# Patient Record
Sex: Female | Born: 1935 | Race: White | Hispanic: No | Smoking: Never smoker
Health system: Southern US, Community
[De-identification: ages and names within clinical notes are randomized; demographics above are authoritative.]

## PROBLEM LIST (undated history)

## (undated) DIAGNOSIS — M8580 Other specified disorders of bone density and structure, unspecified site: Secondary | ICD-10-CM

## (undated) DIAGNOSIS — C50919 Malignant neoplasm of unspecified site of unspecified female breast: Principal | ICD-10-CM

## (undated) DIAGNOSIS — K469 Unspecified abdominal hernia without obstruction or gangrene: Secondary | ICD-10-CM

## (undated) DIAGNOSIS — I77810 Thoracic aortic ectasia: Secondary | ICD-10-CM

## (undated) DIAGNOSIS — I4821 Permanent atrial fibrillation: Secondary | ICD-10-CM

## (undated) DIAGNOSIS — I639 Cerebral infarction, unspecified: Secondary | ICD-10-CM

## (undated) DIAGNOSIS — K5790 Diverticulosis of intestine, part unspecified, without perforation or abscess without bleeding: Secondary | ICD-10-CM

## (undated) DIAGNOSIS — C189 Malignant neoplasm of colon, unspecified: Secondary | ICD-10-CM

## (undated) DIAGNOSIS — I2510 Atherosclerotic heart disease of native coronary artery without angina pectoris: Secondary | ICD-10-CM

## (undated) DIAGNOSIS — I350 Nonrheumatic aortic (valve) stenosis: Secondary | ICD-10-CM

## (undated) DIAGNOSIS — N321 Vesicointestinal fistula: Secondary | ICD-10-CM

## (undated) DIAGNOSIS — I5032 Chronic diastolic (congestive) heart failure: Secondary | ICD-10-CM

## (undated) DIAGNOSIS — R001 Bradycardia, unspecified: Secondary | ICD-10-CM

## (undated) DIAGNOSIS — I5181 Takotsubo syndrome: Secondary | ICD-10-CM

## (undated) DIAGNOSIS — C801 Malignant (primary) neoplasm, unspecified: Secondary | ICD-10-CM

## (undated) DIAGNOSIS — J449 Chronic obstructive pulmonary disease, unspecified: Secondary | ICD-10-CM

## (undated) DIAGNOSIS — J189 Pneumonia, unspecified organism: Secondary | ICD-10-CM

## (undated) DIAGNOSIS — K449 Diaphragmatic hernia without obstruction or gangrene: Secondary | ICD-10-CM

## (undated) DIAGNOSIS — R600 Localized edema: Secondary | ICD-10-CM

## (undated) DIAGNOSIS — T7840XA Allergy, unspecified, initial encounter: Secondary | ICD-10-CM

## (undated) DIAGNOSIS — K219 Gastro-esophageal reflux disease without esophagitis: Secondary | ICD-10-CM

## (undated) DIAGNOSIS — I272 Pulmonary hypertension, unspecified: Secondary | ICD-10-CM

## (undated) DIAGNOSIS — M1990 Unspecified osteoarthritis, unspecified site: Secondary | ICD-10-CM

## (undated) DIAGNOSIS — I10 Essential (primary) hypertension: Secondary | ICD-10-CM

## (undated) DIAGNOSIS — E785 Hyperlipidemia, unspecified: Secondary | ICD-10-CM

## (undated) DIAGNOSIS — R0600 Dyspnea, unspecified: Secondary | ICD-10-CM

## (undated) HISTORY — DX: Gastro-esophageal reflux disease without esophagitis: K21.9

## (undated) HISTORY — DX: Vesicointestinal fistula: N32.1

## (undated) HISTORY — DX: Permanent atrial fibrillation: I48.21

## (undated) HISTORY — DX: Atherosclerotic heart disease of native coronary artery without angina pectoris: I25.10

## (undated) HISTORY — DX: Essential (primary) hypertension: I10

## (undated) HISTORY — DX: Diverticulosis of intestine, part unspecified, without perforation or abscess without bleeding: K57.90

## (undated) HISTORY — DX: Allergy, unspecified, initial encounter: T78.40XA

## (undated) HISTORY — PX: COLECTOMY: SHX59

## (undated) HISTORY — DX: Localized edema: R60.0

## (undated) HISTORY — DX: Chronic diastolic (congestive) heart failure: I50.32

## (undated) HISTORY — DX: Diaphragmatic hernia without obstruction or gangrene: K44.9

## (undated) HISTORY — PX: BREAST SURGERY: SHX581

## (undated) HISTORY — DX: Bradycardia, unspecified: R00.1

## (undated) HISTORY — DX: Pulmonary hypertension, unspecified: I27.20

## (undated) HISTORY — DX: Chronic obstructive pulmonary disease, unspecified: J44.9

## (undated) HISTORY — DX: Malignant (primary) neoplasm, unspecified: C80.1

## (undated) HISTORY — PX: CARDIAC CATHETERIZATION: SHX172

## (undated) HISTORY — DX: Cerebral infarction, unspecified: I63.9

## (undated) HISTORY — DX: Unspecified abdominal hernia without obstruction or gangrene: K46.9

## (undated) HISTORY — DX: Hyperlipidemia, unspecified: E78.5

## (undated) HISTORY — PX: CHOLECYSTECTOMY: SHX55

## (undated) HISTORY — DX: Malignant neoplasm of colon, unspecified: C18.9

## (undated) HISTORY — DX: Nonrheumatic aortic (valve) stenosis: I35.0

## (undated) HISTORY — PX: APPENDECTOMY: SHX54

## (undated) HISTORY — DX: Malignant neoplasm of unspecified site of unspecified female breast: C50.919

## (undated) HISTORY — DX: Thoracic aortic ectasia: I77.810

## (undated) HISTORY — DX: Other specified disorders of bone density and structure, unspecified site: M85.80

---

## 2002-04-08 ENCOUNTER — Inpatient Hospital Stay (HOSPITAL_COMMUNITY): Admission: EM | Admit: 2002-04-08 | Discharge: 2002-04-12 | Payer: Self-pay | Admitting: Emergency Medicine

## 2002-04-08 ENCOUNTER — Encounter: Payer: Self-pay | Admitting: Emergency Medicine

## 2002-04-08 ENCOUNTER — Encounter (INDEPENDENT_AMBULATORY_CARE_PROVIDER_SITE_OTHER): Payer: Self-pay | Admitting: Specialist

## 2002-04-10 ENCOUNTER — Encounter (INDEPENDENT_AMBULATORY_CARE_PROVIDER_SITE_OTHER): Payer: Self-pay | Admitting: Cardiology

## 2002-04-12 ENCOUNTER — Encounter: Payer: Self-pay | Admitting: Family Medicine

## 2002-04-16 ENCOUNTER — Encounter: Admission: RE | Admit: 2002-04-16 | Discharge: 2002-04-16 | Payer: Self-pay | Admitting: Family Medicine

## 2002-04-23 ENCOUNTER — Ambulatory Visit (HOSPITAL_COMMUNITY): Admission: RE | Admit: 2002-04-23 | Discharge: 2002-04-23 | Payer: Self-pay | Admitting: *Deleted

## 2002-04-23 ENCOUNTER — Encounter: Payer: Self-pay | Admitting: *Deleted

## 2002-05-09 ENCOUNTER — Encounter (INDEPENDENT_AMBULATORY_CARE_PROVIDER_SITE_OTHER): Payer: Self-pay | Admitting: Specialist

## 2002-05-09 ENCOUNTER — Inpatient Hospital Stay (HOSPITAL_COMMUNITY): Admission: RE | Admit: 2002-05-09 | Discharge: 2002-05-16 | Payer: Self-pay | Admitting: General Surgery

## 2002-08-23 ENCOUNTER — Encounter: Payer: Self-pay | Admitting: *Deleted

## 2002-08-23 ENCOUNTER — Ambulatory Visit (HOSPITAL_COMMUNITY): Admission: RE | Admit: 2002-08-23 | Discharge: 2002-08-23 | Payer: Self-pay | Admitting: *Deleted

## 2002-10-10 ENCOUNTER — Encounter: Payer: Self-pay | Admitting: *Deleted

## 2002-10-10 ENCOUNTER — Encounter: Payer: Self-pay | Admitting: Emergency Medicine

## 2002-10-10 ENCOUNTER — Inpatient Hospital Stay (HOSPITAL_COMMUNITY): Admission: EM | Admit: 2002-10-10 | Discharge: 2002-10-12 | Payer: Self-pay | Admitting: Emergency Medicine

## 2002-10-11 ENCOUNTER — Encounter (INDEPENDENT_AMBULATORY_CARE_PROVIDER_SITE_OTHER): Payer: Self-pay | Admitting: Cardiology

## 2003-03-29 ENCOUNTER — Encounter: Payer: Self-pay | Admitting: Family Medicine

## 2003-03-29 ENCOUNTER — Encounter: Admission: RE | Admit: 2003-03-29 | Discharge: 2003-03-29 | Payer: Self-pay | Admitting: Family Medicine

## 2003-05-09 ENCOUNTER — Encounter (INDEPENDENT_AMBULATORY_CARE_PROVIDER_SITE_OTHER): Payer: Self-pay | Admitting: Specialist

## 2003-05-09 ENCOUNTER — Ambulatory Visit (HOSPITAL_COMMUNITY): Admission: RE | Admit: 2003-05-09 | Discharge: 2003-05-09 | Payer: Self-pay | Admitting: Gastroenterology

## 2003-05-21 ENCOUNTER — Ambulatory Visit (HOSPITAL_COMMUNITY): Admission: RE | Admit: 2003-05-21 | Discharge: 2003-05-21 | Payer: Self-pay | Admitting: Oncology

## 2003-05-21 ENCOUNTER — Encounter: Payer: Self-pay | Admitting: *Deleted

## 2003-09-11 ENCOUNTER — Inpatient Hospital Stay (HOSPITAL_COMMUNITY): Admission: AD | Admit: 2003-09-11 | Discharge: 2003-09-13 | Payer: Self-pay | Admitting: *Deleted

## 2003-10-14 ENCOUNTER — Ambulatory Visit (HOSPITAL_COMMUNITY): Admission: RE | Admit: 2003-10-14 | Discharge: 2003-10-14 | Payer: Self-pay | Admitting: *Deleted

## 2003-11-11 ENCOUNTER — Ambulatory Visit (HOSPITAL_COMMUNITY): Admission: RE | Admit: 2003-11-11 | Discharge: 2003-11-11 | Payer: Self-pay | Admitting: Oncology

## 2004-03-07 ENCOUNTER — Emergency Department (HOSPITAL_COMMUNITY): Admission: EM | Admit: 2004-03-07 | Discharge: 2004-03-07 | Payer: Self-pay | Admitting: Emergency Medicine

## 2004-05-15 ENCOUNTER — Ambulatory Visit (HOSPITAL_COMMUNITY): Admission: RE | Admit: 2004-05-15 | Discharge: 2004-05-15 | Payer: Self-pay | Admitting: Oncology

## 2004-09-06 DIAGNOSIS — I2510 Atherosclerotic heart disease of native coronary artery without angina pectoris: Secondary | ICD-10-CM

## 2004-09-06 HISTORY — DX: Atherosclerotic heart disease of native coronary artery without angina pectoris: I25.10

## 2005-03-04 ENCOUNTER — Ambulatory Visit (HOSPITAL_COMMUNITY): Admission: RE | Admit: 2005-03-04 | Discharge: 2005-03-04 | Payer: Self-pay | Admitting: *Deleted

## 2005-05-06 ENCOUNTER — Ambulatory Visit: Payer: Self-pay | Admitting: Oncology

## 2005-05-12 ENCOUNTER — Ambulatory Visit (HOSPITAL_COMMUNITY): Admission: RE | Admit: 2005-05-12 | Discharge: 2005-05-12 | Payer: Self-pay | Admitting: Oncology

## 2005-09-15 ENCOUNTER — Encounter: Admission: RE | Admit: 2005-09-15 | Discharge: 2005-09-15 | Payer: Self-pay | Admitting: Family Medicine

## 2005-11-22 ENCOUNTER — Ambulatory Visit: Payer: Self-pay | Admitting: Oncology

## 2006-05-20 ENCOUNTER — Ambulatory Visit: Payer: Self-pay | Admitting: Oncology

## 2006-05-24 LAB — CBC WITH DIFFERENTIAL (CANCER CENTER ONLY)
Eosinophils Absolute: 0.1 10*3/uL (ref 0.0–0.5)
HCT: 39.4 % (ref 34.8–46.6)
HGB: 13.1 g/dL (ref 11.6–15.9)
LYMPH%: 42.4 % (ref 14.0–48.0)
MCV: 91 fL (ref 81–101)
MONO#: 0.5 10*3/uL (ref 0.1–0.9)
NEUT%: 45 % (ref 39.6–80.0)
RBC: 4.35 10*6/uL (ref 3.70–5.32)
WBC: 4.9 10*3/uL (ref 3.9–10.0)

## 2006-05-24 LAB — LACTATE DEHYDROGENASE: LDH: 123 U/L (ref 94–250)

## 2006-05-24 LAB — COMPREHENSIVE METABOLIC PANEL
AST: 16 U/L (ref 0–37)
BUN: 13 mg/dL (ref 6–23)
Calcium: 10 mg/dL (ref 8.4–10.5)
Chloride: 102 mEq/L (ref 96–112)
Creatinine, Ser: 0.74 mg/dL (ref 0.40–1.20)

## 2006-05-24 LAB — CEA: CEA: 5.5 ng/mL — ABNORMAL HIGH (ref 0.0–5.0)

## 2006-10-26 ENCOUNTER — Encounter: Admission: RE | Admit: 2006-10-26 | Discharge: 2006-10-26 | Payer: Self-pay | Admitting: Family Medicine

## 2006-11-08 ENCOUNTER — Encounter (INDEPENDENT_AMBULATORY_CARE_PROVIDER_SITE_OTHER): Payer: Self-pay | Admitting: Radiology

## 2006-11-08 ENCOUNTER — Encounter (INDEPENDENT_AMBULATORY_CARE_PROVIDER_SITE_OTHER): Payer: Self-pay | Admitting: Specialist

## 2006-11-08 ENCOUNTER — Encounter: Admission: RE | Admit: 2006-11-08 | Discharge: 2006-11-08 | Payer: Self-pay | Admitting: Family Medicine

## 2006-11-18 ENCOUNTER — Ambulatory Visit: Payer: Self-pay | Admitting: Oncology

## 2006-11-22 LAB — COMPREHENSIVE METABOLIC PANEL
ALT: 16 U/L (ref 0–35)
AST: 17 U/L (ref 0–37)
Albumin: 4.5 g/dL (ref 3.5–5.2)
Alkaline Phosphatase: 114 U/L (ref 39–117)
Glucose, Bld: 100 mg/dL — ABNORMAL HIGH (ref 70–99)
Potassium: 4.4 mEq/L (ref 3.5–5.3)
Sodium: 140 mEq/L (ref 135–145)
Total Protein: 8.1 g/dL (ref 6.0–8.3)

## 2006-11-22 LAB — CBC WITH DIFFERENTIAL (CANCER CENTER ONLY)
BASO%: 0.5 % (ref 0.0–2.0)
HCT: 41.9 % (ref 34.8–46.6)
LYMPH%: 39 % (ref 14.0–48.0)
MCHC: 33.5 g/dL (ref 32.0–36.0)
MCV: 91 fL (ref 81–101)
MONO#: 0.5 10*3/uL (ref 0.1–0.9)
NEUT%: 50.1 % (ref 39.6–80.0)
RDW: 11.7 % (ref 10.5–14.6)
WBC: 5.6 10*3/uL (ref 3.9–10.0)

## 2006-11-23 ENCOUNTER — Encounter: Admission: RE | Admit: 2006-11-23 | Discharge: 2006-11-23 | Payer: Self-pay | Admitting: Family Medicine

## 2006-12-01 ENCOUNTER — Ambulatory Visit: Admission: RE | Admit: 2006-12-01 | Discharge: 2007-01-25 | Payer: Self-pay | Admitting: *Deleted

## 2006-12-15 ENCOUNTER — Encounter: Admission: RE | Admit: 2006-12-15 | Discharge: 2006-12-15 | Payer: Self-pay | Admitting: General Surgery

## 2006-12-19 ENCOUNTER — Encounter (INDEPENDENT_AMBULATORY_CARE_PROVIDER_SITE_OTHER): Payer: Self-pay | Admitting: Specialist

## 2006-12-19 ENCOUNTER — Encounter: Admission: RE | Admit: 2006-12-19 | Discharge: 2006-12-19 | Payer: Self-pay | Admitting: General Surgery

## 2006-12-19 ENCOUNTER — Ambulatory Visit (HOSPITAL_BASED_OUTPATIENT_CLINIC_OR_DEPARTMENT_OTHER): Admission: RE | Admit: 2006-12-19 | Discharge: 2006-12-19 | Payer: Self-pay | Admitting: General Surgery

## 2007-01-04 ENCOUNTER — Ambulatory Visit: Payer: Self-pay | Admitting: Oncology

## 2007-01-04 LAB — COMPREHENSIVE METABOLIC PANEL
ALT: 18 U/L (ref 0–35)
AST: 17 U/L (ref 0–37)
Albumin: 4.1 g/dL (ref 3.5–5.2)
CO2: 26 mEq/L (ref 19–32)
Calcium: 9.6 mg/dL (ref 8.4–10.5)
Chloride: 103 mEq/L (ref 96–112)
Potassium: 4.1 mEq/L (ref 3.5–5.3)
Sodium: 140 mEq/L (ref 135–145)
Total Protein: 7.6 g/dL (ref 6.0–8.3)

## 2007-01-04 LAB — LACTATE DEHYDROGENASE: LDH: 117 U/L (ref 94–250)

## 2007-01-04 LAB — CBC WITH DIFFERENTIAL (CANCER CENTER ONLY)
BASO%: 0.4 % (ref 0.0–2.0)
EOS%: 1.9 % (ref 0.0–7.0)
HCT: 41.3 % (ref 34.8–46.6)
LYMPH#: 1.8 10*3/uL (ref 0.9–3.3)
LYMPH%: 41.5 % (ref 14.0–48.0)
MCH: 31.1 pg (ref 26.0–34.0)
MCHC: 33.9 g/dL (ref 32.0–36.0)
MONO%: 8.5 % (ref 0.0–13.0)
NEUT%: 47.7 % (ref 39.6–80.0)
RDW: 11.9 % (ref 10.5–14.6)

## 2007-01-09 ENCOUNTER — Encounter: Admission: RE | Admit: 2007-01-09 | Discharge: 2007-01-09 | Payer: Self-pay | Admitting: Oncology

## 2007-02-15 LAB — CBC WITH DIFFERENTIAL (CANCER CENTER ONLY)
BASO#: 0 10*3/uL (ref 0.0–0.2)
EOS%: 1.9 % (ref 0.0–7.0)
Eosinophils Absolute: 0.1 10*3/uL (ref 0.0–0.5)
HGB: 13.4 g/dL (ref 11.6–15.9)
LYMPH#: 1.7 10*3/uL (ref 0.9–3.3)
NEUT#: 2.1 10*3/uL (ref 1.5–6.5)
Platelets: 268 10*3/uL (ref 145–400)
RBC: 4.34 10*6/uL (ref 3.70–5.32)

## 2007-02-15 LAB — COMPREHENSIVE METABOLIC PANEL
AST: 18 U/L (ref 0–37)
Albumin: 4.5 g/dL (ref 3.5–5.2)
Alkaline Phosphatase: 102 U/L (ref 39–117)
BUN: 10 mg/dL (ref 6–23)
Calcium: 9.8 mg/dL (ref 8.4–10.5)
Creatinine, Ser: 0.76 mg/dL (ref 0.40–1.20)
Glucose, Bld: 97 mg/dL (ref 70–99)
Potassium: 4.2 mEq/L (ref 3.5–5.3)

## 2007-02-15 LAB — LIPID PANEL
HDL: 50 mg/dL (ref >39)
LDL Cholesterol: 124 mg/dL — ABNORMAL HIGH (ref 0–99)
Total CHOL/HDL Ratio: 3.9 Ratio
Triglycerides: 112 mg/dL (ref <150)

## 2007-05-09 ENCOUNTER — Ambulatory Visit: Payer: Self-pay | Admitting: Oncology

## 2007-05-10 LAB — CBC WITH DIFFERENTIAL (CANCER CENTER ONLY)
BASO%: 0.4 % (ref 0.0–2.0)
EOS%: 1.5 % (ref 0.0–7.0)
LYMPH#: 2.2 10*3/uL (ref 0.9–3.3)
MCHC: 34.1 g/dL (ref 32.0–36.0)
MONO%: 9.2 % (ref 0.0–13.0)
NEUT#: 2.5 10*3/uL (ref 1.5–6.5)
Platelets: 253 10*3/uL (ref 145–400)
RDW: 12 % (ref 10.5–14.6)

## 2007-05-10 LAB — COMPREHENSIVE METABOLIC PANEL
Alkaline Phosphatase: 80 U/L (ref 39–117)
CO2: 29 mEq/L (ref 19–32)
Creatinine, Ser: 0.62 mg/dL (ref 0.40–1.20)
Glucose, Bld: 121 mg/dL — ABNORMAL HIGH (ref 70–99)
Sodium: 139 mEq/L (ref 135–145)
Total Bilirubin: 0.4 mg/dL (ref 0.3–1.2)

## 2007-05-10 LAB — CANCER ANTIGEN 27.29: CA 27.29: 11 U/mL (ref 0–39)

## 2007-05-19 LAB — BASIC METABOLIC PANEL - CANCER CENTER ONLY
CO2: 27 mEq/L (ref 18–33)
Chloride: 101 mEq/L (ref 98–108)
Glucose, Bld: 153 mg/dL — ABNORMAL HIGH (ref 73–118)
Sodium: 141 mEq/L (ref 128–145)

## 2007-12-07 ENCOUNTER — Ambulatory Visit: Payer: Self-pay | Admitting: Oncology

## 2007-12-12 LAB — COMPREHENSIVE METABOLIC PANEL
ALT: 13 U/L (ref 0–35)
AST: 15 U/L (ref 0–37)
Calcium: 9.6 mg/dL (ref 8.4–10.5)
Chloride: 104 mEq/L (ref 96–112)
Creatinine, Ser: 0.67 mg/dL (ref 0.40–1.20)
Potassium: 4.2 mEq/L (ref 3.5–5.3)

## 2007-12-12 LAB — CBC WITH DIFFERENTIAL (CANCER CENTER ONLY)
Eosinophils Absolute: 0.1 10*3/uL (ref 0.0–0.5)
MONO#: 0.5 10*3/uL (ref 0.1–0.9)
NEUT#: 2 10*3/uL (ref 1.5–6.5)
Platelets: 235 10*3/uL (ref 145–400)
RBC: 4.16 10*6/uL (ref 3.70–5.32)
WBC: 4.7 10*3/uL (ref 3.9–10.0)

## 2008-02-29 ENCOUNTER — Ambulatory Visit: Payer: Self-pay | Admitting: Oncology

## 2008-04-29 ENCOUNTER — Ambulatory Visit: Payer: Self-pay | Admitting: Oncology

## 2008-04-30 LAB — COMPREHENSIVE METABOLIC PANEL
AST: 18 U/L (ref 0–37)
Albumin: 3.6 g/dL (ref 3.5–5.2)
BUN: 9 mg/dL (ref 6–23)
CO2: 28 mEq/L (ref 19–32)
Calcium: 9.5 mg/dL (ref 8.4–10.5)
Chloride: 102 mEq/L (ref 96–112)
Glucose, Bld: 128 mg/dL — ABNORMAL HIGH (ref 70–99)
Potassium: 4 mEq/L (ref 3.5–5.3)

## 2008-04-30 LAB — CBC WITH DIFFERENTIAL (CANCER CENTER ONLY)
EOS%: 1.3 % (ref 0.0–7.0)
Eosinophils Absolute: 0.1 10*3/uL (ref 0.0–0.5)
LYMPH#: 2.1 10*3/uL (ref 0.9–3.3)
MCH: 30.1 pg (ref 26.0–34.0)
MCHC: 34.2 g/dL (ref 32.0–36.0)
MONO%: 9.6 % (ref 0.0–13.0)
NEUT#: 2.7 10*3/uL (ref 1.5–6.5)
Platelets: 226 10*3/uL (ref 145–400)
RBC: 4.13 10*6/uL (ref 3.70–5.32)

## 2008-08-26 ENCOUNTER — Ambulatory Visit: Payer: Self-pay | Admitting: Oncology

## 2008-08-28 LAB — CMP (CANCER CENTER ONLY)
ALT(SGPT): 16 U/L (ref 10–47)
AST: 22 U/L (ref 11–38)
Alkaline Phosphatase: 51 U/L (ref 26–84)
Creat: 0.9 mg/dl (ref 0.6–1.2)
Total Bilirubin: 0.4 mg/dl (ref 0.20–1.60)

## 2008-08-28 LAB — CBC WITH DIFFERENTIAL (CANCER CENTER ONLY)
BASO#: 0.1 10*3/uL (ref 0.0–0.2)
BASO%: 0.8 % (ref 0.0–2.0)
EOS%: 1.5 % (ref 0.0–7.0)
HGB: 12.5 g/dL (ref 11.6–15.9)
MCH: 29.9 pg (ref 26.0–34.0)
MCHC: 32.9 g/dL (ref 32.0–36.0)
MONO%: 9.4 % (ref 0.0–13.0)
NEUT#: 3.1 10*3/uL (ref 1.5–6.5)
Platelets: 230 10*3/uL (ref 145–400)
RDW: 11.9 % (ref 10.5–14.6)

## 2009-02-25 ENCOUNTER — Ambulatory Visit: Payer: Self-pay | Admitting: Oncology

## 2009-02-26 LAB — CBC WITH DIFFERENTIAL (CANCER CENTER ONLY)
BASO#: 0 10*3/uL (ref 0.0–0.2)
EOS%: 1.2 % (ref 0.0–7.0)
HCT: 34.5 % — ABNORMAL LOW (ref 34.8–46.6)
HGB: 12 g/dL (ref 11.6–15.9)
LYMPH%: 43.4 % (ref 14.0–48.0)
MCH: 30.5 pg (ref 26.0–34.0)
MCHC: 34.7 g/dL (ref 32.0–36.0)
MCV: 88 fL (ref 81–101)
MONO%: 9.4 % (ref 0.0–13.0)
NEUT%: 45.5 % (ref 39.6–80.0)
RDW: 13 % (ref 10.5–14.6)

## 2009-02-26 LAB — CMP (CANCER CENTER ONLY)
Albumin: 3.1 g/dL — ABNORMAL LOW (ref 3.3–5.5)
BUN, Bld: 12 mg/dL (ref 7–22)
Calcium: 9.6 mg/dL (ref 8.0–10.3)
Chloride: 106 mEq/L (ref 98–108)
Creat: 1 mg/dl (ref 0.6–1.2)
Glucose, Bld: 135 mg/dL — ABNORMAL HIGH (ref 73–118)
Potassium: 4.4 mEq/L (ref 3.3–4.7)

## 2009-02-26 LAB — CEA: CEA: 3.6 ng/mL (ref 0.0–5.0)

## 2009-02-26 LAB — CANCER ANTIGEN 27.29: CA 27.29: 20 U/mL (ref 0–39)

## 2009-09-26 ENCOUNTER — Ambulatory Visit: Payer: Self-pay | Admitting: Oncology

## 2009-09-30 LAB — CBC WITH DIFFERENTIAL (CANCER CENTER ONLY)
BASO#: 0.1 10*3/uL (ref 0.0–0.2)
EOS%: 0.7 % (ref 0.0–7.0)
Eosinophils Absolute: 0.1 10*3/uL (ref 0.0–0.5)
HCT: 38.9 % (ref 34.8–46.6)
HGB: 12.8 g/dL (ref 11.6–15.9)
LYMPH%: 38.8 % (ref 14.0–48.0)
MCH: 30.5 pg (ref 26.0–34.0)
MCHC: 32.9 g/dL (ref 32.0–36.0)
MCV: 93 fL (ref 81–101)
MONO%: 8.9 % (ref 0.0–13.0)
NEUT%: 50.9 % (ref 39.6–80.0)

## 2009-09-30 LAB — CMP (CANCER CENTER ONLY)
ALT(SGPT): 21 U/L (ref 10–47)
Albumin: 3.5 g/dL (ref 3.3–5.5)
Calcium: 9.5 mg/dL (ref 8.0–10.3)
Total Bilirubin: 0.6 mg/dl (ref 0.20–1.60)
Total Protein: 7.6 g/dL (ref 6.4–8.1)

## 2009-09-30 LAB — CEA: CEA: 3.5 ng/mL (ref 0.0–5.0)

## 2010-04-03 ENCOUNTER — Ambulatory Visit: Payer: Self-pay | Admitting: Oncology

## 2010-04-09 LAB — CMP (CANCER CENTER ONLY)
BUN, Bld: 11 mg/dL (ref 7–22)
CO2: 26 mEq/L (ref 18–33)
Creat: 0.7 mg/dl (ref 0.6–1.2)
Glucose, Bld: 120 mg/dL — ABNORMAL HIGH (ref 73–118)
Total Bilirubin: 0.4 mg/dl (ref 0.20–1.60)

## 2010-04-09 LAB — CBC WITH DIFFERENTIAL (CANCER CENTER ONLY)
Eosinophils Absolute: 0.1 10*3/uL (ref 0.0–0.5)
HCT: 34.8 % (ref 34.8–46.6)
LYMPH%: 37.7 % (ref 14.0–48.0)
MCH: 30.2 pg (ref 26.0–34.0)
MCV: 89 fL (ref 81–101)
MONO#: 0.7 10*3/uL (ref 0.1–0.9)
MONO%: 11.3 % (ref 0.0–13.0)
NEUT%: 48.7 % (ref 39.6–80.0)
RBC: 3.9 10*6/uL (ref 3.70–5.32)
WBC: 5.9 10*3/uL (ref 3.9–10.0)

## 2010-08-07 ENCOUNTER — Ambulatory Visit: Payer: Self-pay | Admitting: Ophthalmology

## 2010-08-18 ENCOUNTER — Ambulatory Visit: Payer: Self-pay | Admitting: Ophthalmology

## 2010-09-06 HISTORY — PX: EYE SURGERY: SHX253

## 2010-10-14 ENCOUNTER — Encounter (HOSPITAL_BASED_OUTPATIENT_CLINIC_OR_DEPARTMENT_OTHER): Payer: Medicare Other | Admitting: Oncology

## 2010-10-14 ENCOUNTER — Other Ambulatory Visit: Payer: Self-pay | Admitting: Oncology

## 2010-10-14 DIAGNOSIS — C50419 Malignant neoplasm of upper-outer quadrant of unspecified female breast: Secondary | ICD-10-CM

## 2010-10-14 DIAGNOSIS — R7309 Other abnormal glucose: Secondary | ICD-10-CM

## 2010-10-14 DIAGNOSIS — Z85038 Personal history of other malignant neoplasm of large intestine: Secondary | ICD-10-CM

## 2010-10-14 LAB — CBC WITH DIFFERENTIAL/PLATELET
Basophils Absolute: 0 10*3/uL (ref 0.0–0.1)
EOS%: 1.2 % (ref 0.0–7.0)
HGB: 12.8 g/dL (ref 11.6–15.9)
MCH: 31.2 pg (ref 25.1–34.0)
MCV: 92.8 fL (ref 79.5–101.0)
MONO%: 11.6 % (ref 0.0–14.0)
NEUT%: 46.2 % (ref 38.4–76.8)
RDW: 13.3 % (ref 11.2–14.5)

## 2010-10-14 LAB — COMPREHENSIVE METABOLIC PANEL
ALT: 10 U/L (ref 0–35)
Alkaline Phosphatase: 55 U/L (ref 39–117)
CO2: 27 mEq/L (ref 19–32)
Creatinine, Ser: 0.75 mg/dL (ref 0.40–1.20)
Total Bilirubin: 0.4 mg/dL (ref 0.3–1.2)

## 2010-10-14 LAB — CEA: CEA: 4.1 ng/mL (ref 0.0–5.0)

## 2010-10-27 ENCOUNTER — Ambulatory Visit: Payer: Self-pay | Admitting: Ophthalmology

## 2010-11-03 ENCOUNTER — Ambulatory Visit: Payer: Self-pay | Admitting: Ophthalmology

## 2011-01-01 ENCOUNTER — Encounter (HOSPITAL_BASED_OUTPATIENT_CLINIC_OR_DEPARTMENT_OTHER): Payer: Medicare Other | Admitting: Oncology

## 2011-01-01 DIAGNOSIS — Z170 Estrogen receptor positive status [ER+]: Secondary | ICD-10-CM

## 2011-01-01 DIAGNOSIS — N6459 Other signs and symptoms in breast: Secondary | ICD-10-CM

## 2011-01-01 DIAGNOSIS — C50419 Malignant neoplasm of upper-outer quadrant of unspecified female breast: Secondary | ICD-10-CM

## 2011-01-01 DIAGNOSIS — Z85038 Personal history of other malignant neoplasm of large intestine: Secondary | ICD-10-CM

## 2011-01-04 ENCOUNTER — Other Ambulatory Visit: Payer: Self-pay | Admitting: Oncology

## 2011-01-04 DIAGNOSIS — C50919 Malignant neoplasm of unspecified site of unspecified female breast: Secondary | ICD-10-CM

## 2011-01-04 DIAGNOSIS — R51 Headache: Secondary | ICD-10-CM

## 2011-01-06 ENCOUNTER — Ambulatory Visit (HOSPITAL_COMMUNITY)
Admission: RE | Admit: 2011-01-06 | Discharge: 2011-01-06 | Disposition: A | Discharge status: Home / Self Care | Payer: Medicare Other | Source: Ambulatory Visit | Attending: Oncology | Admitting: Oncology

## 2011-01-06 DIAGNOSIS — C50919 Malignant neoplasm of unspecified site of unspecified female breast: Secondary | ICD-10-CM

## 2011-01-06 DIAGNOSIS — R51 Headache: Secondary | ICD-10-CM

## 2011-01-06 DIAGNOSIS — N6459 Other signs and symptoms in breast: Secondary | ICD-10-CM | POA: Insufficient documentation

## 2011-01-06 DIAGNOSIS — Z853 Personal history of malignant neoplasm of breast: Secondary | ICD-10-CM | POA: Insufficient documentation

## 2011-01-06 LAB — CREATININE, SERUM
Creatinine, Ser: 0.79 mg/dL (ref 0.4–1.2)
GFR calc Af Amer: >60 mL/min (ref >60)
GFR calc non Af Amer: >60 mL/min (ref >60)

## 2011-01-22 NOTE — Cardiovascular Report (Signed)
NAME:  [name], [name] NO.:  [id number]   MEDICAL RECORD NO.:  [id number]          PATIENT TYPE:  OIB   LOCATION:  2899                         FACILITY:  [facility]   PHYSICIAN:  Meade Maw, M.D.    DATE OF BIRTH:  10/13/1934   DATE OF PROCEDURE:  03/04/2005  DATE OF DISCHARGE:                              CARDIAC CATHETERIZATION   REFERRING PHYSICIAN:  [facility]   INDICATIONS FOR PROCEDURE:  Ongoing exertional chest pain relieved by  sublingual nitroglycerin.   PROCEDURE:  After obtaining written informed consent the patient was brought  to the cardiac catheterization laboratory in a post absorptive state.  Preoperative sedation was achieved using IV Versed.  The right groin was  prepped and draped in usual sterile fashion.  Local anesthesia was achieved  using 1% Xylocaine.  A 6-French hemostasis sheath was placed into the right  femoral artery using the modified Seldinger technique.  Selective coronary  angiography was performed using JL4, JR4 Judkins catheter and multiple views  were obtained.  All catheter exchanges were made over a guidewire.  Single-  plane ventriculogram was performed in the RAO position using a 6-French  straight pigtail catheter.  There was no critical disease identified.  The  patient was transferred to the holding area.  The hemostasis sheath was  removed.  Hemostasis was achieved using digital pressure.  There was no  immediate complications.   FINDINGS:  Aortic pressure was 123/60.  LV pressure was 128/6 with an EDP  11.  Single plane ventriculogram revealed normal wall motion.  There was no  significant mitral regurgitation noted.  Ejection fraction of 65-70%.   CORONARY ANGIOGRAPHY:  The left main coronary artery trifurcates into the  left anterior descending, ramus, and circumflex vessel.  There is a 20-30%  distal left main noted.   Left anterior descending:  The left anterior descending appears to have a  20-  30% ostial lesion in one view only.  Gives rise to a small V1, larger V2,  goes on to end as an apical branch.   Ramus vessel:  The ramus vessel is a large caliber vessel.  Has no critical  disease.   Circumflex vessel:  The circumflex vessel is a moderate vessel.  Gives rise  to a trivial OM1, OM2.  No significant disease is noted.   Right coronary artery:  Right coronary artery is a large, dominant artery.  There is no significant disease in the right coronary artery.   FINAL IMPRESSION:  1.  Noncritical disease in the proximal and distal left main, otherwise      normal coronaries.  2.  Normal left ventricular function, ejection fraction is 65%.  3.  Possible esophageal spasm versus coronary artery spasm as etiology of      her chest pain.  If chest pain persists Bisig empirically treat for      possible coronary spasm with Cardizem versus long-acting nitroglycerin.       HP/MEDQ  D:  03/04/2005  T:  03/04/2005  Job:  [id number]   cc:  Jari Pigg, M.D.

## 2011-01-22 NOTE — Op Note (Signed)
NAME:  [name], [name]                    ACCOUNT NO.:  [id number]   MEDICAL RECORD NO.:  [id number]          PATIENT TYPE:  AMB   LOCATION:  DSC                          FACILITY:  [facility]   PHYSICIAN:  Rose Phi. Maple Hudson, M.D.   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   OPERATION:  1. Blue dye injection.  2. Left partial mastectomy with needle localization and specimen      mammogram.  3. Left sentinel lymph node biopsy.   SURGEON:  Rose Phi. Maple Hudson, M.D.   ANESTHESIA:  General.   OPERATIVE PROCEDURE:  Prior to coming to the operating room, a  localizing wire had been placed at about the 4 o'clock position of the  left breast.  In addition, 1 mCi of technetium sulfa colloid was  injected intradermally in the left breast.   After suitable general anesthesia was induced, the patient was placed in  the supine position with the arms extended on the arm board.  Five mL of  a mixture of 2 mL of methylene blue and 3 mL of injectable saline was  injected in the subareolar tissue and the breast gently massaged for 3  minutes.  We then prepped and draped the breast and axilla.   The localizing wire was at the 4 o'clock position of the left breast, so  I designed a radial incision centered on the wire and marked that with a  pencil and then infiltrated the skin with the local anesthetic mixture.  The incision was made and the wire delivered in the incision and then a  wide excision of wire and surrounding tissue was carried out.  Hemostasis obtained with a cautery.  Specimen oriented for the  pathologist and then submitted for specimen mammogram.   While that was being done, we carefully scanned the axilla and there was  one single hot spot and a short transverse left axillary  incision was  made with dissection through the subcutaneous tissue to the  clavipectoral fascia.  Deep to the fascia was a single large bluish and  hot lymph node.  I excised that as a sentinel node.  There were no other  palpable blue or hot nodes.   Specimen mammogram confirmed the removal of lesion.   Both incisions were then closed with 3-0 Vicryl and subcuticular 4-0  Monocryl.  Dermabond was applied.  Pathologist reported the sentinel  node negative for metastatic disease and the margins clean.   Dressings were applied and the patient transferred to the recovery room  in satisfactory condition having tolerated the procedure well.      Rose Phi. Maple Hudson, M.D.  Electronically Signed     PRY/MEDQ  D:  12/19/2006  T:  12/19/2006  Job:  [id number]

## 2011-01-22 NOTE — Consult Note (Signed)
NAME:  [name], [name]                              ACCOUNT NO.:  [id number]   MEDICAL RECORD NO.:  [id number]                   PATIENT TYPE:  EMS   LOCATION:  ED                                   FACILITY:  [facility]   PHYSICIAN:  Currie Paris, M.D.           DATE OF BIRTH:  01-13-1935   DATE OF CONSULTATION:  DATE OF DISCHARGE:                                   CONSULTATION   REASON FOR CONSULTATION:  Right lower quadrant pain.   HISTORY OF PRESENT ILLNESS:  The patient is a 76 year old lady who has a two  to three day history of abdominal pain which began in the right lower  quadrant and has persisted in that location.  It has been involved with  intermittent diarrhea but she has not had any bloody stools.  The pain has  been fairly constant.  It has not really gotten significantly worse since  its onset.  She does not find anything in particular that makes it worse but  holding a little pressure in the right lower quadrant does make it feel a  little bit better.  She has not had any associated symptoms such as nausea,  vomiting, fever, or chills.  She had her last chemo one week ago today and  does notice it after her last chemo before this one she had some diarrhea  which resolved spontaneously.  Prior to that time she was having problems  with some constipation.   Because of the continued pain she decided to come to the emergency room  today.   PAST MEDICAL HISTORY:  The patient actually presented back in August of 2003  with complaints of chest pain and fatigue and ended up being admitted.  She  at that time was found to be markedly anemic and underwent colonoscopy and  was found to have an adenocarcinoma of the right transverse colon.  She also  had an episode of atrial fibrillation which was thought secondary to  distress and that apparently had resolved and she has not had further  problems.  Meade Maw, M.D. was the cardiologist evaluating her and  Bernette Redbird,  M.D. was the gastroenterologist that evaluated her.   MEDICATIONS:  Have included her recent chemotherapy.  She has also been on  lovastatin and Zyrtec in the past.   ALLERGIES:  CELEBREX apparently causes mouth sores.   HABITS:  Smokes:  None.  Alcohol:  None.   SOCIAL HISTORY:  She is retired.  Lives with her husband.  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   FAMILY HISTORY:  Significant her mother died of MI.  Father died of old age.  She has a sister with stomach cancer and a sister with colon cancer.   REVIEW OF SYSTEMS:  HEENT:  Negative.  CHEST:  No cough, shortness of  breath.  HEART:  History of atrial fibrillation and  chest pain.  Again, that  was apparently secondary to stress from her anemia.  That has been  asymptomatic during her postoperative period since last September.  ABDOMEN:  Negative except for HPI.  Of note is that her colon surgery was in September  2003 and she had a right hemicolectomy for a T3 N0 colon cancer of the  hepatic flexure.   PHYSICAL EXAMINATION:  GENERAL:  The patient is alert, awake, and in no  distress.  VITAL SIGNS:  Blood pressure 150/80, pulse about 125, respirations 20.  HEENT:  Head is normocephalic.  Pupils are round and regular, nonicteric.  Mucous membranes are moist in the oropharynx.  NECK:  Supple.  No masses or thyromegaly.  LUNGS:  Generally clear.  HEART:  Irregularly irregular.  I do not hear any murmurs, rubs, or gallops.  ABDOMEN:  Not distended and has a well healed midline scar.  It is generally  soft but there is some guarding in the right lower quadrant and a modest  amount of right lower quadrant tenderness to direct palpation.  The rest of  the abdomen is nontender.  There is no rebound tenderness present.  The  liver and spleen are not palpable.  Bowel sounds are normal.  RECTAL:  Not done.  EXTREMITIES:  No cyanosis or edema.  EXTREMITIES:  She has femoral, dorsalis pedis, posterior tibial pulses   intact.   LABORATORY STUDIES:  White count 6000 with a fairly normal differential and  a hemoglobin of 12.  CMET is unremarkable.  Urinalysis is normal with a  specific gravity 10.05.   EKG shows atrial fibrillation with rapid ventricular response.   CT scan is noted and reviewed with Anselmo Pickler, M.D.  She does have  thickened loops of small bowel in the right lower quadrant which are  nonspecific and could represent ischemic or infectious process.  There is no  obstruction and no other evidence of inflammatory processes such as free  air, abscess, etc.   IMPRESSION:  1. Right lower quadrant pain secondary to inflammatory process of small     bowel, infectious versus ischemic.  2. Atrial fibrillation, new onset.  3. History of colon cancer.  4. Chemotherapy.   RECOMMENDATIONS:  I spoke with Molly Maduro C. Whorf, M.D. who will come down and  evaluate her.  At the present time she does not have a surgical abdomen,  although I think needs to be monitored for developments.  I would initially  treat this as an infectious process with antibiotics but her atrial  fibrillation could be new and have caused a clot and I think will need to be  evaluated by cardiology to see if she has any evidence of clots and whether  she needs to be anticoagulated.  Also, would put her on antibiotics,  probably Cipro and Flagyl, although other choices will be picked.   We will follow her for further developments.                                               Currie Paris, M.D.    CJS/MEDQ  D:  10/10/2002  T:  10/10/2002  Job:  [id number]   cc:   Enzo Montgomery. Filbert Berthold, M.D.  [address] [place] [address]  Fax: [phone]

## 2011-01-22 NOTE — Discharge Summary (Signed)
NAME:  [name], [name]                              ACCOUNT NO.:  [id number]   MEDICAL RECORD NO.:  [id number]                   PATIENT TYPE:  INP   LOCATION:  0364                                 FACILITY:  [facility]   PHYSICIAN:  Enzo Montgomery. Filbert Berthold, M.D.               DATE OF BIRTH:  [birth year]   DATE OF ADMISSION:  10/10/2002  DATE OF DISCHARGE:  10/12/2002                                 DISCHARGE SUMMARY   INTRODUCTION:  A 76 year old female with a stage IIA colon cancer receiving  5-FU/leucovorin, admitted with atrial fibrillation/right lower quadrant  pain.   ONCOLOGIC HISTORY:  She was diagnosed with a stage IIA with T3 N0 colon  cancer in August of 2003 when she presented with heme-positive stools and a  hemoglobin of approximately 7.  At that time, she was in atrial  fibrillation.  She underwent a right hemicolectomy by Dr. Carolynne Edouard on May 09, 2002 as well as was transfused blood, and with correction of her anemia  her atrial fibrillation remitted.  She was followed in the office with  adjuvant 5-FU/leucovorin given on a weekly x6 with two weeks off x4 months  and was in normal sinus rhythm on all office visits.   She presented to the [facility] Emergency Room after a two-day history of  intermittent right lower quadrant pain.  An abdominal CT scan showed some  bowel wall thickening associated with a distal small bowel loops and slight  ill definition of mesenteric fat surrounding these bowel loops.  An EKG  found her to be in atrial fibrillation.  She specifically denied any nausea,  vomiting, fevers, chills, diarrhea, shortness of breath, palpitations, or  other worrisome problems, however.   HOSPITAL COURSE:  She was admitted to the hospital and placed on telemetry.  Lovenox was started since she has now documented atrial fibrillation x2  without symptoms.  Coumadin was also started and by discharge her INR was  2.6.  She was placed on digoxin as well as Cardizem 30 mg  q.i.d., and the  day following admission she was back in normal sinus rhythm.  She was placed  on antibiotics empirically with Augmentin and Flagyl, these antibiotics  chosen since she was not appearing very ill and Cipro has an interaction  with Coumadin.  She had a low-grade temp while in the hospital and at the  day of discharge her temperature was 100.2.  She was feeling well with  improving right lower quadrant tenderness and no diarrhea of significance  throughout the hospitalization.  She had some mild chest tightness and a  troponin was checked and that was negative.  She was otherwise up and about  and doing well and was discharged on October 12, 2002.   DISCHARGE DIAGNOSES:  1. Colon cancer stage IIA status post resection and right hemicolectomy in     September of 2003  and two cycles of  5-fluorouracil/leucovorin per the [facility] regimen.  There is no plan  for further chemotherapy at this time.  1. Paroxysmal atrial fibrillation now on anticoagulation/digoxin/Cardizem.  2. Gastroesophageal reflux disease.  3. History of hypercholesterolemia.  She is not on any medicines for that.   MEDICATIONS AT DISCHARGE:  1. Coumadin 5 mg and 1 mg tabs to be used as directed.  2. Cardizem CD 120 mg one tab q.a.m.  3. Digoxin 0.125 mg q.a.m.  4. Augmentin 500 mg b.i.d. x5 days.  5. Flagyl 500 mg t.i.d. x5 days.   ALLERGIES:  CELEBREX causes mouth sores.   FOLLOW UP:  1. Discharge followup with myself in about 5 days to repeat PT/INR, digoxin     level, and other labs to see how she is doing.  2. Discharge follow up with Dr. Carolynne Edouard in about one week.   CONDITION ON DISCHARGE:  Good.                                               Robert C. Filbert Berthold, M.D.    RCW/MEDQ  D:  10/12/2002  T:  10/12/2002  Job:  [id number]   cc:   Currie Paris, M.D.  [address], [address]  [place]  [place] [address]  Fax: [phone]   Ollen Gross. Vernell Morgans, M.D.  [address]   [place]  [place] [address]  Fax: [phone]   Meade Maw, M.D.  [address]  [place]  [place] [address]  Fax: [phone]

## 2011-01-22 NOTE — H&P (Signed)
NAME:  [name], [name]                              ACCOUNT NO.:  [id number]   MEDICAL RECORD NO.:  [id number]                   PATIENT TYPE:  INP   LOCATION:  4738                                 FACILITY:  [facility]   PHYSICIAN:  Meade Maw, M.D.                 DATE OF BIRTH:  06/14/1935   DATE OF ADMISSION:  09/11/2003  DATE OF DISCHARGE:  09/13/2003                                HISTORY & PHYSICAL   CHIEF COMPLAINT:  Atrial fibrillation, need to initiate Sotalol therapy.   HISTORY OF PRESENT ILLNESS:  The patient is a 76 year old female patient of  Dr. Lindell Spar with history of atrial fibrillation since 13-Gowin-2003.  Initial  anticoagulation was deferred secondary to the patient's need for colectomy  due to adenocarcinoma of the colon.  She was seen by Dr. Fraser Din in August  of 2003 and was found to be in sinus rhythm, was re-evaluated in 13-Hawe-2004 and  she was back in atrial fibrillation so Coumadin therapy was initiated by  family practice.  INR was therapeutic and recent Cardiolite showed no  ischemia.  Dr. Fraser Din has re-evaluated the patient in the interim and has  discussed rate control versus rhythm control for her atrial fibrillation.  The patient has opted for rhythm control and has been admitted to initiate  Sotalol therapy.   REVIEW OF SYMPTOMS:  Noncontributory.  She does have end-of-day swelling of  her hands and ankles which the patient attributes to osteoarthritis.  She  has not been having any chest pain, cough, orthopnea, dyspnea on exertion.  She is not aware of any palpitations.  She does not experience any  dizziness.   PAST MEDICAL HISTORY:  Adenocarcinoma of colon status post colectomy in  01-16-2002.  Atrial fibrillation intermittent since 13-Cockrell-2003, more pronounced since  01-17-03.  Dyslipidemia.  Iron deficiency anemia, resolved post colectomy.   PAST SURGICAL HISTORY:  Cardiac catheterization in January 2002 and  colectomy in 13-Daffron-2003.   FAMILY MEDICAL HISTORY:  The patient's mother  died of an MI.  Her father  died of old age.  She had a sister who had a CVA, a sister with gastric  cancer and a brother with diabetes.   SOCIAL HISTORY:  The patient has been married for 47 years.  She has one  adult son.  She does not use tobacco or alcohol.  She is retired.   PHYSICAL EXAMINATION:  GENERAL:  This is a pleasant, well-developed female  in no acute distress.  She is here for medication induction for atrial  fibrillation.  VITAL SIGNS:  Temperature 98.  Blood pressure 162/80. Pulse 48 and  irregular.  Respirations 20.  HEENT: Head is normocephalic.  Sclerae not injected.  Mucous membranes are  pink and moist.  There is no adenopathy.  NEUROLOGICAL:  Patient is alert and oriented X3.  Cranial nerves II-XII are  intact.  EXTREMITIES:  Patient moves all extremities X4.  Strength is 5/5.  No  cyanosis, clubbing or edema.  Symmetrical in appearance.  No effusion or  erythema.  Hands appear slightly enlarged and Dominski be representative of edema  versus osteoarthritic changes.  No significant joint deformity or erythema.  Pulses are 2+ bilaterally, radial, femoral and pedal pulses.  CHEST:  Clear to auscultation.  Respiratory effort not labored.  She is  currently on room air.  CARDIAC:  Heart sounds are S1, S2 with no rubs, murmurs, thrills or gallops.  Apical radical pulses are regular.  There is no jugular venous distention.  Carotids are 2+ without bruits.  ABDOMEN:  Soft, nontender, nondistended.  Bowel sounds are present.  There  is no hepatosplenomegaly masses or bruits.   ALLERGIES:  CELEBREX and ? regarding CARDIOLITE DYE?   MEDICATIONS:  1. Coumadin 2.5 mg on Monday, Wednesday and Friday alternating with 3 mg on     Tuesday, Thursday, Saturday, Sunday.  2. Digitek 0.2 mg daily.  3. Cardizem CD 180 mg daily.  4. Diovan 80 mg daily.  5. Crestor 5 mg one-half tablet daily.  6. Prilosec over-the-counter PRN.  7. Zyrtec 10 mg PRN.   LABORATORY DATA:  Recent INR  was 2.97 on September 05, 2003.  Diagnostics:  Electrocardiogram shows atrial fibrillation with ventricular rate 56.  A 2-  dimensional echocardiogram done August 07, 2003 showed mild LA enlargement,  mild calcific AS and mild concentric hypertrophy with preserved systolic  function.  Cardiolite done August 08, 2003 showed normal perfusion, poor  exercise tolerance and no ischemia.   IMPRESSION AND PLAN:  1. Atrial fibrillation.     a. Start Sotalol p.o.     b. Daily electrocardiogram's.  Watch QTC.     c. Stop digoxin and Cardizem.     d. Continue Coumadin with daily INR checks.     e. BMET to check on potassium level.  2. Dyslipidemia.     a. Continue her statin.  3. History of anemia.     a. Check CBC level.      Allison L. Joretta Bachelor, M.D.    ALE/MEDQ  D:  09/30/2003  T:  09/30/2003  Job:  [id number]

## 2011-05-28 ENCOUNTER — Other Ambulatory Visit: Payer: Self-pay | Admitting: Oncology

## 2011-05-28 ENCOUNTER — Encounter (HOSPITAL_BASED_OUTPATIENT_CLINIC_OR_DEPARTMENT_OTHER): Payer: Medicare Other | Admitting: Oncology

## 2011-05-28 DIAGNOSIS — Z85038 Personal history of other malignant neoplasm of large intestine: Secondary | ICD-10-CM

## 2011-05-28 DIAGNOSIS — R7309 Other abnormal glucose: Secondary | ICD-10-CM

## 2011-05-28 DIAGNOSIS — C50419 Malignant neoplasm of upper-outer quadrant of unspecified female breast: Secondary | ICD-10-CM

## 2011-05-28 LAB — CBC WITH DIFFERENTIAL/PLATELET
Basophils Absolute: 0 10*3/uL (ref 0.0–0.1)
Eosinophils Absolute: 0.1 10*3/uL (ref 0.0–0.5)
HGB: 13.1 g/dL (ref 11.6–15.9)
MCV: 91.8 fL (ref 79.5–101.0)
MONO#: 0.6 10*3/uL (ref 0.1–0.9)
MONO%: 9.1 % (ref 0.0–14.0)
NEUT#: 3.8 10*3/uL (ref 1.5–6.5)
Platelets: 249 10*3/uL (ref 145–400)
RBC: 4.2 10*6/uL (ref 3.70–5.45)
RDW: 14.1 % (ref 11.2–14.5)
WBC: 6.9 10*3/uL (ref 3.9–10.3)

## 2011-05-28 LAB — COMPREHENSIVE METABOLIC PANEL
Albumin: 4.1 g/dL (ref 3.5–5.2)
Alkaline Phosphatase: 68 U/L (ref 39–117)
BUN: 12 mg/dL (ref 6–23)
CO2: 26 mEq/L (ref 19–32)
Calcium: 9.7 mg/dL (ref 8.4–10.5)
Glucose, Bld: 126 mg/dL — ABNORMAL HIGH (ref 70–99)
Potassium: 4.4 mEq/L (ref 3.5–5.3)
Sodium: 140 mEq/L (ref 135–145)
Total Protein: 7.5 g/dL (ref 6.0–8.3)

## 2011-11-08 ENCOUNTER — Telehealth: Payer: Self-pay | Admitting: Oncology

## 2011-11-08 NOTE — Telephone Encounter (Signed)
S/w pt re appt for 5/2. 

## 2011-12-31 ENCOUNTER — Encounter (INDEPENDENT_AMBULATORY_CARE_PROVIDER_SITE_OTHER): Payer: Self-pay | Admitting: General Surgery

## 2012-01-05 ENCOUNTER — Encounter (INDEPENDENT_AMBULATORY_CARE_PROVIDER_SITE_OTHER): Payer: Self-pay | Admitting: General Surgery

## 2012-01-06 ENCOUNTER — Ambulatory Visit (HOSPITAL_BASED_OUTPATIENT_CLINIC_OR_DEPARTMENT_OTHER): Payer: Medicare Other | Admitting: Oncology

## 2012-01-06 ENCOUNTER — Encounter: Payer: Self-pay | Admitting: Oncology

## 2012-01-06 ENCOUNTER — Other Ambulatory Visit (HOSPITAL_BASED_OUTPATIENT_CLINIC_OR_DEPARTMENT_OTHER): Payer: Medicare Other | Admitting: Lab

## 2012-01-06 ENCOUNTER — Telehealth: Payer: Self-pay | Admitting: *Deleted

## 2012-01-06 VITALS — BP 156/69 | HR 82 | Temp 98.4°F | Ht 67.5 in | Wt 235.7 lb

## 2012-01-06 DIAGNOSIS — R109 Unspecified abdominal pain: Secondary | ICD-10-CM

## 2012-01-06 DIAGNOSIS — C50419 Malignant neoplasm of upper-outer quadrant of unspecified female breast: Secondary | ICD-10-CM

## 2012-01-06 DIAGNOSIS — C189 Malignant neoplasm of colon, unspecified: Secondary | ICD-10-CM | POA: Insufficient documentation

## 2012-01-06 DIAGNOSIS — C50412 Malignant neoplasm of upper-outer quadrant of left female breast: Secondary | ICD-10-CM | POA: Insufficient documentation

## 2012-01-06 DIAGNOSIS — R7309 Other abnormal glucose: Secondary | ICD-10-CM

## 2012-01-06 DIAGNOSIS — C50919 Malignant neoplasm of unspecified site of unspecified female breast: Secondary | ICD-10-CM

## 2012-01-06 DIAGNOSIS — Z170 Estrogen receptor positive status [ER+]: Secondary | ICD-10-CM

## 2012-01-06 DIAGNOSIS — Z85038 Personal history of other malignant neoplasm of large intestine: Secondary | ICD-10-CM

## 2012-01-06 HISTORY — DX: Malignant neoplasm of unspecified site of unspecified female breast: C50.919

## 2012-01-06 HISTORY — DX: Malignant neoplasm of colon, unspecified: C18.9

## 2012-01-06 LAB — CBC WITH DIFFERENTIAL/PLATELET
BASO%: 0.8 % (ref 0.0–2.0)
Basophils Absolute: 0 10*3/uL (ref 0.0–0.1)
EOS%: 1.6 % (ref 0.0–7.0)
Eosinophils Absolute: 0.1 10*3/uL (ref 0.0–0.5)
HCT: 39.3 % (ref 34.8–46.6)
HGB: 12.9 g/dL (ref 11.6–15.9)
LYMPH%: 36.5 % (ref 14.0–49.7)
MCH: 30 pg (ref 25.1–34.0)
MCHC: 32.8 g/dL (ref 31.5–36.0)
MCV: 91.4 fL (ref 79.5–101.0)
MONO#: 0.8 10*3/uL (ref 0.1–0.9)
MONO%: 12.5 % (ref 0.0–14.0)
NEUT#: 3 10*3/uL (ref 1.5–6.5)
NEUT%: 48.6 % (ref 38.4–76.8)
Platelets: 246 10*3/uL (ref 145–400)
RBC: 4.29 10*6/uL (ref 3.70–5.45)
RDW: 13.9 % (ref 11.2–14.5)
WBC: 6.1 10*3/uL (ref 3.9–10.3)
lymph#: 2.2 10*3/uL (ref 0.9–3.3)
nRBC: 0 % (ref 0–0)

## 2012-01-06 LAB — COMPREHENSIVE METABOLIC PANEL
ALT: 17 U/L (ref 0–35)
AST: 17 U/L (ref 0–37)
Albumin: 4.2 g/dL (ref 3.5–5.2)
Alkaline Phosphatase: 80 U/L (ref 39–117)
BUN: 11 mg/dL (ref 6–23)
CO2: 29 mEq/L (ref 19–32)
Calcium: 9.9 mg/dL (ref 8.4–10.5)
Chloride: 104 mEq/L (ref 96–112)
Creatinine, Ser: 0.76 mg/dL (ref 0.50–1.10)
Glucose, Bld: 103 mg/dL — ABNORMAL HIGH (ref 70–99)
Potassium: 4.8 mEq/L (ref 3.5–5.3)
Sodium: 139 mEq/L (ref 135–145)
Total Bilirubin: 0.4 mg/dL (ref 0.3–1.2)
Total Protein: 7.4 g/dL (ref 6.0–8.3)

## 2012-01-06 NOTE — Telephone Encounter (Signed)
gave patient appointment for 08-10-2012 starting at 10:30 am printed out calendar and gave to the patient

## 2012-01-06 NOTE — Patient Instructions (Signed)
1. Doing well 2. I will see you back in 08/07/12

## 2012-01-06 NOTE — Progress Notes (Signed)
OFFICE PROGRESS NOTE  CC  [name], MD, MD [address] [place] [address] Dr. Chevis Pretty  DIAGNOSIS: 77 year old female with  History of stage II colon carcinoma originally diagnosed September 2003.  #2 history of invasive ductal carcinoma of the left breast diagnosed March 2008 status post lumpectomy with sentinel node dissection for a stage I ER positive PR positive HER-2/neu negative breast cancer.  PRIOR THERAPY:  #1 patient with history of stage II colon carcinoma on observation only without evidence of recurrent disease patient is now 10 years out since diagnosis.  #2 stage I invasive ductal carcinoma of the left breast. Patient has received tamoxifen and Aromasin in the past she has completed a total of 4 years of therapy. Her therapy was discontinued due to nipple discharge and patient declined any further treatments for her breast cancer.  CURRENT THERAPY:Observation  INTERVAL HISTORY: Yolani Vo Kerby 77 y.o. female returns for Followup visit today. Overall she seems to be doing well she does tell me that she developed a little bit above the lower abdominal pain after she was lifting things. She became very concerned and called her surgeon Dr. Carolynne Edouard. She has an upcoming appointment with him. Today she is without any abdominal pain she denies any fevers chills headaches no diarrhea or constipation no nausea or vomiting no bleeding she has not noticed any masses in her right or left breast. Remainder of the 10 point review of systems is negative.  MEDICAL HISTORY: Past Medical History  Diagnosis Date  . Atrial fibrillation   . Hypertension   . Cancer     right colon and left breast  . Breast cancer 01/06/2012  . Colon cancer 01/06/2012    ALLERGIES:  is allergic to celebrex.  MEDICATIONS:  Current Outpatient Prescriptions  Medication Sig Dispense Refill  . cetirizine (ZYRTEC) 10 MG tablet Take 10 mg by mouth daily.      Marland Kitchen diltiazem (TIAZAC) 240 MG 24 hr  capsule Take 180 mg by mouth daily.       . furosemide (LASIX) 20 MG tablet Take 20 mg by mouth daily.       . isosorbide mononitrate (ISMO,MONOKET) 20 MG tablet Take 20 mg by mouth 2 (two) times daily.      Marland Kitchen omeprazole (PRILOSEC) 10 MG capsule Take 10 mg by mouth daily.      . valsartan-hydrochlorothiazide (DIOVAN-HCT) 80-12.5 MG per tablet Take 1 tablet by mouth daily.      Marland Kitchen warfarin (COUMADIN) 2.5 MG tablet Take 2.5 mg by mouth daily.      . tamoxifen (NOLVADEX) 20 MG tablet Take 20 mg by mouth daily.        SURGICAL HISTORY:  Past Surgical History  Procedure Date  . Colectomy     right side  . Mastectomy partial / lumpectomy 2008    left breast    REVIEW OF SYSTEMS:  Pertinent items are noted in HPI.   PHYSICAL EXAMINATION: General appearance: alert, cooperative and appears stated age Lymph nodes: Cervical, supraclavicular, and axillary nodes normal. Resp: clear to auscultation bilaterally and normal percussion bilaterally Back: symmetric, no curvature. ROM normal. No CVA tenderness. Cardio: regular rate and rhythm, S1, S2 normal, no murmur, click, rub or gallop GI: soft, non-tender; bowel sounds normal; no masses,  no organomegaly Extremities: extremities normal, atraumatic, no cyanosis or edema Neurologic: Grossly normal Bilateral breast examination left breast reveals well-healed surgical scar no nodularity no evidence of local recurrence no nipple discharge or  masses. Right breast no masses or nipple discharge. ECOG PERFORMANCE STATUS: 1 - Symptomatic but completely ambulatory  Blood pressure 156/69, pulse 82, temperature 98.4 F (36.9 C), temperature source Oral, height 5' 7.5" (1.715 m), weight 235 lb 11.2 oz (106.913 kg).  LABORATORY DATA: Lab Results  Component Value Date   WBC 6.1 01/06/2012   HGB 12.9 01/06/2012   HCT 39.3 01/06/2012   MCV 91.4 01/06/2012   PLT 246 01/06/2012      Chemistry      Component Value Date/Time   NA 140 05/28/2011 1013   NA 140 04/09/2010  1247   K 4.4 05/28/2011 1013   K 4.1 04/09/2010 1247   CL 102 05/28/2011 1013   CL 100 04/09/2010 1247   CO2 26 05/28/2011 1013   CO2 26 04/09/2010 1247   BUN 12 05/28/2011 1013   BUN 11 04/09/2010 1247   CREATININE 0.74 05/28/2011 1013   CREATININE 0.7 04/09/2010 1247      Component Value Date/Time   CALCIUM 9.7 05/28/2011 1013   CALCIUM 9.3 04/09/2010 1247   ALKPHOS 68 05/28/2011 1013   ALKPHOS 41 04/09/2010 1247   AST 19 05/28/2011 1013   AST 22 04/09/2010 1247   ALT 14 05/28/2011 1013   BILITOT 0.4 05/28/2011 1013   BILITOT 0.40 04/09/2010 1247       RADIOGRAPHIC STUDIES:  No results found.  ASSESSMENT: 77 year old female with  #1 history of stage II colon cancer back in 2003. Patient is without evidence of local recurrence.  #2 stage I invasive ductal carcinoma of the left breast status post lumpectomy followed by radiation and then adjuvant tamoxifen followed by Aromasin for a total of 4 years of therapy. Patient declined therapy thereafter. She has no evidence of local recurrence.  #3 lower abdominal pain unclear etiology. This however has not resolved.   PLAN:   #1 patient will continue to be followed by me on a every 6 month basis her next visit with me will be in December 2013.  #2 she will keep her appointment with Dr. Carolynne Edouard.  #3 she knows to call with any problems questions or concerns.   All questions were answered. The patient knows to call the clinic with any problems, questions or concerns. We can certainly see the patient much sooner if necessary.  I spent 20 minutes counseling the patient face to face. The total time spent in the appointment was 30 minutes.    Drue Second, MD Medical/Oncology [facility] [phone] (beeper) [phone] (Office)  01/06/2012, 11:18 AM

## 2012-01-11 ENCOUNTER — Encounter (INDEPENDENT_AMBULATORY_CARE_PROVIDER_SITE_OTHER): Payer: Self-pay | Admitting: General Surgery

## 2012-01-11 ENCOUNTER — Ambulatory Visit (INDEPENDENT_AMBULATORY_CARE_PROVIDER_SITE_OTHER): Payer: Medicare Other | Admitting: General Surgery

## 2012-01-11 DIAGNOSIS — R109 Unspecified abdominal pain: Secondary | ICD-10-CM | POA: Insufficient documentation

## 2012-01-11 NOTE — Progress Notes (Signed)
Subjective:     Patient ID: [name], female   DOB: 01-12-1935, 77 y.o.   MRN: [id number]  HPI The patient is a 77 year old white female who is an old patient of mine. She states that she was recently picking up her daughter to take him to the vet when she felt a pulling of her lower abdominal wall. The pain she described was mostly along the midline. She has not noticed any new bulges in her abdominal wall. She denies any nausea or vomiting. Since then the pain is improved but not completely gone.  Review of Systems  Constitutional: Negative.   HENT: Negative.   Eyes: Negative.   Respiratory: Negative.   Cardiovascular: Negative.   Gastrointestinal: Positive for abdominal pain.  Genitourinary: Negative.   Musculoskeletal: Negative.   Skin: Negative.   Neurological: Negative.   Hematological: Negative.   Psychiatric/Behavioral: Negative.        Objective:   Physical Exam  Constitutional: She is oriented to person, place, and time. She appears well-developed and well-nourished.  HENT:  Head: Normocephalic and atraumatic.  Eyes: Conjunctivae and EOM are normal. Pupils are equal, round, and reactive to light.  Neck: Normal range of motion. Neck supple.  Cardiovascular: Normal rate, regular rhythm and normal heart sounds.   Pulmonary/Chest: Effort normal and breath sounds normal.  Abdominal: Soft. Bowel sounds are normal.       Mild tenderness along the lower midline. I do not appreciate a palpable bulge.  Musculoskeletal: Normal range of motion.  Neurological: She is alert and oriented to person, place, and time.  Skin: Skin is warm and dry.  Psychiatric: She has a normal mood and affect. Her behavior is normal.       Assessment:     The patient has some soreness on her lower midline abdominal wall after picking up a heavy dog. I cannot appreciate any clinical evidence of a hernia. I have recommended that she get a CT scan of her abdomen and pelvis to look for any radiographic  evidence of a hernia. We will call her with the results of this and then proceed accordingly    Plan:     Plan for CT of the abdomen and pelvis

## 2012-01-11 NOTE — Patient Instructions (Signed)
Rest abdominal wall as much as possible

## 2012-01-13 ENCOUNTER — Telehealth (INDEPENDENT_AMBULATORY_CARE_PROVIDER_SITE_OTHER): Payer: Self-pay | Admitting: General Surgery

## 2012-01-13 ENCOUNTER — Ambulatory Visit
Admission: RE | Admit: 2012-01-13 | Discharge: 2012-01-13 | Disposition: A | Discharge status: Home / Self Care | Payer: Medicare Other | Source: Ambulatory Visit | Attending: General Surgery | Admitting: General Surgery

## 2012-01-13 MED ORDER — IOHEXOL 300 MG/ML  SOLN
100.0000 mL | Freq: Once | INTRAMUSCULAR | Status: AC | PRN
  Administered 2012-01-13: 100 mL via INTRAVENOUS

## 2012-01-13 NOTE — Telephone Encounter (Signed)
Called pt ans she is aware that she had not hernia and keep her appt with Dr Carolynne Edouard on 02-08-12

## 2012-01-14 ENCOUNTER — Telehealth (INDEPENDENT_AMBULATORY_CARE_PROVIDER_SITE_OTHER): Payer: Self-pay | Admitting: General Surgery

## 2012-01-14 NOTE — Telephone Encounter (Signed)
Left message explaining that Dr. Carolynne Edouard had to cancel his 6/4 office and that I was going to reschedule her to 6/3.  I asked if she could please call me back to make sure her new apt time is okay.  I rescheduled her to Monday 6/3 at 4:30

## 2012-02-07 ENCOUNTER — Ambulatory Visit (INDEPENDENT_AMBULATORY_CARE_PROVIDER_SITE_OTHER): Payer: Medicare Other | Admitting: General Surgery

## 2012-02-07 ENCOUNTER — Encounter (INDEPENDENT_AMBULATORY_CARE_PROVIDER_SITE_OTHER): Payer: Self-pay | Admitting: General Surgery

## 2012-02-07 VITALS — BP 160/116 | HR 80 | Temp 98.7°F | Resp 14 | Ht 67.0 in | Wt 236.4 lb

## 2012-02-07 DIAGNOSIS — R109 Unspecified abdominal pain: Secondary | ICD-10-CM

## 2012-02-08 ENCOUNTER — Encounter (INDEPENDENT_AMBULATORY_CARE_PROVIDER_SITE_OTHER): Payer: Medicare Other | Admitting: General Surgery

## 2012-02-21 NOTE — Progress Notes (Signed)
Subjective:     Patient ID: [name], female   DOB: 04/22/1935, 77 y.o.   MRN: [id number]  HPI The patient is a 77 year old white female who is 5 years out from a left breast lumpectomy in 10 years out from a right colectomy. We saw her recently to evaluate her for possible hernia. She was having some lower abdominal pain after lifting something heavy. The pain she was experiencing is definitely improving. She only notices occasionally. She denies any nausea or vomiting. She has not felt any bulging of her abdominal wall.Her recent chest CT scan showed no evidence of a ventral hernia.  Review of Systems     Objective:   Physical Exam On exam her abdomen is soft and nontender. There is no palpable fascial defect or bulge.    Assessment:     Probable abdominal straining.    Plan:     At this point I think she can return to her normal activities. Will plan to see her back on a yearly basis.

## 2012-08-10 ENCOUNTER — Ambulatory Visit (HOSPITAL_BASED_OUTPATIENT_CLINIC_OR_DEPARTMENT_OTHER): Payer: Medicare Other | Admitting: Oncology

## 2012-08-10 ENCOUNTER — Other Ambulatory Visit (HOSPITAL_BASED_OUTPATIENT_CLINIC_OR_DEPARTMENT_OTHER): Payer: Medicare Other | Admitting: Lab

## 2012-08-10 ENCOUNTER — Encounter: Payer: Self-pay | Admitting: Oncology

## 2012-08-10 ENCOUNTER — Telehealth: Payer: Self-pay | Admitting: Oncology

## 2012-08-10 VITALS — BP 150/68 | HR 76 | Temp 98.3°F | Resp 20 | Ht 67.0 in | Wt 236.3 lb

## 2012-08-10 DIAGNOSIS — Z170 Estrogen receptor positive status [ER+]: Secondary | ICD-10-CM

## 2012-08-10 DIAGNOSIS — Z85038 Personal history of other malignant neoplasm of large intestine: Secondary | ICD-10-CM

## 2012-08-10 DIAGNOSIS — C189 Malignant neoplasm of colon, unspecified: Secondary | ICD-10-CM

## 2012-08-10 DIAGNOSIS — C50419 Malignant neoplasm of upper-outer quadrant of unspecified female breast: Secondary | ICD-10-CM

## 2012-08-10 DIAGNOSIS — R109 Unspecified abdominal pain: Secondary | ICD-10-CM

## 2012-08-10 DIAGNOSIS — C50919 Malignant neoplasm of unspecified site of unspecified female breast: Secondary | ICD-10-CM

## 2012-08-10 LAB — CBC WITH DIFFERENTIAL/PLATELET
BASO%: 0.3 % (ref 0.0–2.0)
LYMPH%: 35.9 % (ref 14.0–49.7)
MCHC: 33.3 g/dL (ref 31.5–36.0)
MCV: 92.7 fL (ref 79.5–101.0)
MONO#: 0.8 10*3/uL (ref 0.1–0.9)
MONO%: 12.8 % (ref 0.0–14.0)
NEUT#: 2.9 10*3/uL (ref 1.5–6.5)
Platelets: 219 10*3/uL (ref 145–400)
RBC: 3.96 10*6/uL (ref 3.70–5.45)
RDW: 13.9 % (ref 11.2–14.5)
WBC: 5.9 10*3/uL (ref 3.9–10.3)

## 2012-08-10 LAB — COMPREHENSIVE METABOLIC PANEL (CC13)
ALT: 15 U/L (ref 0–55)
AST: 17 U/L (ref 5–34)
Creatinine: 0.8 mg/dL (ref 0.6–1.1)
Sodium: 141 mEq/L (ref 136–145)
Total Bilirubin: 0.37 mg/dL (ref 0.20–1.20)
Total Protein: 7.2 g/dL (ref 6.4–8.3)

## 2012-08-10 MED ORDER — LETROZOLE 2.5 MG PO TABS: 2.5000 mg | ORAL_TABLET | Freq: Every day | ORAL | Status: DC

## 2012-08-10 NOTE — Patient Instructions (Addendum)
Doing well  I will see you back in 6 months 

## 2012-08-10 NOTE — Telephone Encounter (Signed)
lmonvm adviisng the pt of her r/s appts from June to feb 2014. Asked the pt to call me back to confirm the message.

## 2012-08-10 NOTE — Progress Notes (Signed)
OFFICE PROGRESS NOTE  CC  [name], PA-C [address] [place] [address] Dr. Chevis Pretty  DIAGNOSIS: 77 year old female with  History of stage II colon carcinoma originally diagnosed September 2003.  #2 history of invasive ductal carcinoma of the left breast diagnosed March 2008 status post lumpectomy with sentinel node dissection for a stage I ER positive PR positive HER-2/neu negative breast cancer.  PRIOR THERAPY:  #1 patient with history of stage II colon carcinoma on observation only without evidence of recurrent disease patient is now 10 years out since diagnosis.  #2 stage I invasive ductal carcinoma of the left breast. Patient has received tamoxifen and Aromasin in the past she has completed a total of 4 years of therapy. Her therapy was discontinued due to nipple discharge and patient declined any further treatments for her breast cancer.  CURRENT THERAPY:Observation  INTERVAL HISTORY: [name] 77 y.o. female returns for Followup visit today.she denies any fevers chills headaches no diarrhea or constipation no nausea or vomiting no bleeding she has not noticed any masses in her right or left breast. Remainder of the 10 point review of systems is negative.  MEDICAL HISTORY: Past Medical History  Diagnosis Date  . Atrial fibrillation   . Hypertension   . Cancer     right colon and left breast  . Breast cancer 01/06/2012  . Colon cancer 01/06/2012  . Hernia     ALLERGIES:  is allergic to contrast media; celebrex; and tape.  MEDICATIONS:  Current Outpatient Prescriptions  Medication Sig Dispense Refill  . diltiazem (TIAZAC) 240 MG 24 hr capsule Take 180 mg by mouth daily.       . furosemide (LASIX) 20 MG tablet Take 20 mg by mouth daily.       . isosorbide mononitrate (ISMO,MONOKET) 20 MG tablet Take 20 mg by mouth 2 (two) times daily.      Marland Kitchen omeprazole (PRILOSEC) 10 MG capsule Take 10 mg by mouth daily.      . valsartan-hydrochlorothiazide  (DIOVAN-HCT) 80-12.5 MG per tablet Take 1 tablet by mouth daily.      Marland Kitchen warfarin (COUMADIN) 2.5 MG tablet Take 2.5 mg by mouth daily.      . cetirizine (ZYRTEC) 10 MG tablet Take 10 mg by mouth daily.        SURGICAL HISTORY:  Past Surgical History  Procedure Date  . Colectomy     right side  . Mastectomy partial / lumpectomy 2008    left breast    REVIEW OF SYSTEMS:  Pertinent items are noted in HPI.   PHYSICAL EXAMINATION: General appearance: alert, cooperative and appears stated age Lymph nodes: Cervical, supraclavicular, and axillary nodes normal. Resp: clear to auscultation bilaterally and normal percussion bilaterally Back: symmetric, no curvature. ROM normal. No CVA tenderness. Cardio: regular rate and rhythm, S1, S2 normal, no murmur, click, rub or gallop GI: soft, non-tender; bowel sounds normal; no masses,  no organomegaly Extremities: extremities normal, atraumatic, no cyanosis or edema Neurologic: Grossly normal Bilateral breast examination left breast reveals well-healed surgical scar no nodularity no evidence of local recurrence no nipple discharge or masses. Right breast no masses or nipple discharge. ECOG PERFORMANCE STATUS: 1 - Symptomatic but completely ambulatory  Blood pressure 150/68, pulse 76, temperature 98.3 F (36.8 C), temperature source Oral, resp. rate 20, height 5\' 7"  (1.702 m), weight 236 lb 4.8 oz (107.185 kg).  LABORATORY DATA: Lab Results  Component Value Date   WBC 5.9 08/10/2012  HGB 12.2 08/10/2012   HCT 36.7 08/10/2012   MCV 92.7 08/10/2012   PLT 219 08/10/2012      Chemistry      Component Value Date/Time   NA 141 08/10/2012 1026   NA 139 01/06/2012 1001   NA 140 04/09/2010 1247   K 4.0 08/10/2012 1026   K 4.8 01/06/2012 1001   K 4.1 04/09/2010 1247   CL 105 08/10/2012 1026   CL 104 01/06/2012 1001   CL 100 04/09/2010 1247   CO2 26 08/10/2012 1026   CO2 29 01/06/2012 1001   CO2 26 04/09/2010 1247   BUN 8.0 08/10/2012 1026   BUN 11 01/06/2012 1001    BUN 11 04/09/2010 1247   CREATININE 0.8 08/10/2012 1026   CREATININE 0.76 01/06/2012 1001   CREATININE 0.7 04/09/2010 1247      Component Value Date/Time   CALCIUM 9.1 08/10/2012 1026   CALCIUM 9.9 01/06/2012 1001   CALCIUM 9.3 04/09/2010 1247   ALKPHOS 100 08/10/2012 1026   ALKPHOS 80 01/06/2012 1001   ALKPHOS 41 04/09/2010 1247   AST 17 08/10/2012 1026   AST 17 01/06/2012 1001   AST 22 04/09/2010 1247   ALT 15 08/10/2012 1026   ALT 17 01/06/2012 1001   BILITOT 0.37 08/10/2012 1026   BILITOT 0.4 01/06/2012 1001   BILITOT 0.40 04/09/2010 1247       RADIOGRAPHIC STUDIES:  No results found.  ASSESSMENT: 77 year old female with  #1 history of stage II colon cancer back in 2003. Patient is without evidence of local recurrence.  #2 stage I invasive ductal carcinoma of the left breast status post lumpectomy followed by radiation and then adjuvant tamoxifen followed by Aromasin for a total of 4 years of therapy. Patient declined therapy thereafter. She has no evidence of local recurrence.  #3 lower abdominal pain unclear etiology. This has resolved.   PLAN:   #1 patient will continue to be followed by me on a every 6 month basis her next visit with me will be in June 2014   #2 she knows to call with any problems questions or concerns.   All questions were answered. The patient knows to call the clinic with any problems, questions or concerns. We can certainly see the patient much sooner if necessary.  I spent 20 minutes counseling the patient face to face. The total time spent in the appointment was 30 minutes.    Drue Second, MD Medical/Oncology [facility] [phone] (beeper) [phone] (Office)  08/10/2012, 11:40 AM

## 2012-08-10 NOTE — Telephone Encounter (Signed)
gve the pt her June 2014 appt calendar 

## 2012-08-11 ENCOUNTER — Telehealth: Payer: Self-pay | Admitting: Oncology

## 2012-08-11 ENCOUNTER — Telehealth: Payer: Self-pay | Admitting: Medical Oncology

## 2012-08-11 ENCOUNTER — Encounter: Payer: Self-pay | Admitting: Medical Oncology

## 2012-08-11 LAB — CEA: CEA: 4 ng/mL (ref 0.0–5.0)

## 2012-08-11 NOTE — Telephone Encounter (Signed)
error 

## 2012-08-11 NOTE — Telephone Encounter (Signed)
S/w the pt and she is aware that her f/u appts are due for June.

## 2012-08-11 NOTE — Telephone Encounter (Signed)
S/w the pt regarding the md wanted her to come back in [place] versus June. Per pt she does not want to come back in feb has to much going on. Per pt she will come back in April.

## 2012-10-16 ENCOUNTER — Other Ambulatory Visit: Payer: Medicare Other | Admitting: Lab

## 2012-10-16 ENCOUNTER — Ambulatory Visit: Payer: Medicare Other | Admitting: Oncology

## 2012-10-28 ENCOUNTER — Encounter: Payer: Self-pay | Admitting: *Deleted

## 2012-10-28 ENCOUNTER — Encounter: Payer: Self-pay | Admitting: Family Medicine

## 2012-10-28 DIAGNOSIS — K449 Diaphragmatic hernia without obstruction or gangrene: Secondary | ICD-10-CM | POA: Insufficient documentation

## 2012-10-28 DIAGNOSIS — I10 Essential (primary) hypertension: Secondary | ICD-10-CM | POA: Insufficient documentation

## 2012-10-28 DIAGNOSIS — M8580 Other specified disorders of bone density and structure, unspecified site: Secondary | ICD-10-CM | POA: Insufficient documentation

## 2012-10-28 DIAGNOSIS — K5790 Diverticulosis of intestine, part unspecified, without perforation or abscess without bleeding: Secondary | ICD-10-CM | POA: Insufficient documentation

## 2012-10-28 DIAGNOSIS — K219 Gastro-esophageal reflux disease without esophagitis: Secondary | ICD-10-CM | POA: Insufficient documentation

## 2012-10-28 DIAGNOSIS — J449 Chronic obstructive pulmonary disease, unspecified: Secondary | ICD-10-CM | POA: Insufficient documentation

## 2012-12-11 ENCOUNTER — Ambulatory Visit (INDEPENDENT_AMBULATORY_CARE_PROVIDER_SITE_OTHER): Payer: BC Managed Care – HMO | Admitting: Physician Assistant

## 2012-12-11 ENCOUNTER — Encounter: Payer: Self-pay | Admitting: Physician Assistant

## 2012-12-11 VITALS — BP 136/72 | HR 64 | Temp 98.5°F | Resp 18 | Ht 65.0 in | Wt 234.0 lb

## 2012-12-11 DIAGNOSIS — D689 Coagulation defect, unspecified: Secondary | ICD-10-CM

## 2012-12-11 DIAGNOSIS — I4891 Unspecified atrial fibrillation: Secondary | ICD-10-CM

## 2012-12-11 DIAGNOSIS — J309 Allergic rhinitis, unspecified: Secondary | ICD-10-CM

## 2012-12-11 LAB — PT WITH INR/FINGERSTICK: INR, fingerstick: 2.7 — ABNORMAL HIGH (ref 0.80–1.20)

## 2012-12-11 MED ORDER — OLOPATADINE HCL 0.2 % OP SOLN: 1.0000 [drp] | Freq: Every morning | OPHTHALMIC | Status: DC

## 2012-12-11 NOTE — Progress Notes (Signed)
  Anticoagulation Progress Note  Patient ID: [name] MRN: [id number], DOB: [birth year], 78 y.o. Date of Encounter: @DATE @   Blood pressure 136/72, pulse 64, temperature 98.5 F (36.9 C), temperature source Oral, resp. rate 18, height 5\' 5"  (1.651 m), weight 234 lb (106.142 kg). Diagnosis:A Fib INR Goal:2-3  Current Coumadin Dose: 3mg  QD except 2.5mg  Tuesdays, Thursdays, Sundays Missed Coumadin Dose? No Medication Changes? No Signs/Symptoms of Bleeding? No Dietary Intake of Vitamin K? Same steady amount of intake INR: 2.7 Assessment:(Therapeutic/Subtherapeutic/Supratherapeutic): Therapeutic Plan: Continue same current dose of 3mg  QD except 2.5 mg Tuesdays, Thursdays, Sundays Follow-up: 4 weeks  Pt also reports that eyes are itchy. Says it happens every spring. No rhinorrhea, sneezing. No ST. No cough. No fever/chills.  Exam; Eyes with mild conjunctival injection bilaterally. Eyelids with mild erythema/min swelling. Nose, Ears, Throat nml.  Lungs clear. Heart: Irreg.   Allergic Conjunctivitis:  Pataday- Olopatadine HCl 0.2 % SOLN; Apply 1 drop to eye every morning.  Dispense: 2.5 mL; Refill: [address] [place], [place] 12/11/2012 1:53 PM

## 2012-12-13 ENCOUNTER — Other Ambulatory Visit: Payer: Medicare Other | Admitting: Lab

## 2012-12-13 ENCOUNTER — Ambulatory Visit: Payer: Medicare Other | Admitting: Oncology

## 2013-01-22 ENCOUNTER — Ambulatory Visit (INDEPENDENT_AMBULATORY_CARE_PROVIDER_SITE_OTHER): Payer: BC Managed Care – HMO | Admitting: Physician Assistant

## 2013-01-22 ENCOUNTER — Encounter: Payer: Self-pay | Admitting: Physician Assistant

## 2013-01-22 VITALS — BP 140/72 | HR 68 | Temp 97.2°F | Resp 18 | Wt 238.0 lb

## 2013-01-22 DIAGNOSIS — D689 Coagulation defect, unspecified: Secondary | ICD-10-CM

## 2013-01-22 DIAGNOSIS — I4891 Unspecified atrial fibrillation: Secondary | ICD-10-CM

## 2013-01-22 LAB — PT WITH INR/FINGERSTICK: PT, fingerstick: 28.2 seconds — ABNORMAL HIGH (ref 10.4–12.5)

## 2013-01-22 NOTE — Addendum Note (Signed)
Addended by: WRAY, [place] on: 01/22/2013 09:59 AM   Modules accepted: Orders

## 2013-01-22 NOTE — Progress Notes (Signed)
Patient ID: [name] MRN: [id number], DOB: [birth year], 78 y.o. Date of Encounter: 01/22/2013, 8:21 AM    Chief Complaint:  Chief Complaint  Patient presents with  . Protime check     HPI: 78 y.o. year old female here to check PT/INR. She has no complaints today.  She has had no bleeding. She has had no skipped doses of Coumadin. She has had consistent intake of Vitamin K.    Home Meds: See attached medication section for any medications that were entered at today's visit. The computer does not put those onto this list.The following list is a list of meds entered prior to today's visit.   Current Outpatient Prescriptions on File Prior to Visit  Medication Sig Dispense Refill  . cetirizine (ZYRTEC) 10 MG tablet Take 10 mg by mouth daily.      Marland Kitchen diltiazem (DILACOR XR) 180 MG 24 hr capsule Take 180 mg by mouth daily.      Marland Kitchen diltiazem (TIAZAC) 240 MG 24 hr capsule Take 180 mg by mouth daily.       . furosemide (LASIX) 20 MG tablet Take 20 mg by mouth daily.       . hydrOXYzine (ATARAX/VISTARIL) 25 MG tablet Take 25 mg by mouth 3 (three) times daily as needed for itching.      . isosorbide mononitrate (ISMO,MONOKET) 20 MG tablet Take 20 mg by mouth 2 (two) times daily.      Marland Kitchen losartan (COZAAR) 100 MG tablet Take 100 mg by mouth daily.      . Olopatadine HCl 0.2 % SOLN Apply 1 drop to eye every morning.  2.5 mL  11  . omeprazole (PRILOSEC) 10 MG capsule Take 10 mg by mouth daily.      . simvastatin (ZOCOR) 10 MG tablet Take 10 mg by mouth at bedtime.      . valsartan-hydrochlorothiazide (DIOVAN-HCT) 80-12.5 MG per tablet Take 1 tablet by mouth daily.      Marland Kitchen warfarin (COUMADIN) 2.5 MG tablet Take 3mg  every day except 2.5mg  on Tuesdays, Thursdays, Sundays       No current facility-administered medications on file prior to visit.    Allergies:  Allergies  Allergen Reactions  . Contrast Media (Iodinated Diagnostic Agents) Other (See Comments)    Per pt strong burning sensation starting  in chest radiating outward  . Celebrex (Celecoxib)   . Statins   . Tape Itching and Rash    Where applied and will spread      Review of Systems: See HPI for pertinent ROS. All other ROS negative.    Physical Exam: Blood pressure 140/72, pulse 68, temperature 97.2 F (36.2 C), temperature source Oral, resp. rate 18, weight 238 lb (107.956 kg)., Body mass index is 39.61 kg/(m^2). General:Overweight WF.  Appears in no acute distress. Lungs: Clear bilaterally to auscultation without wheezes, rales, or rhonchi. Breathing is unlabored. Heart: Irregular rhythm. Msk:  Strength and tone normal for age. Extremities/Skin: Warm and dry. No clubbing or cyanosis. No edema.  Neuro: Alert and oriented X 3. Moves all extremities spontaneously. Gait is normal. CNII-XII grossly in tact. Psych:  Responds to questions appropriately with a normal affect.     ASSESSMENT AND PLAN:  78 y.o. year old female with  1. Other and unspecified coagulation defects - PT with INR/Fingerstick - PT with INR/Fingerstick  2. Atrial fibrillation - PT with INR/Fingerstick - PT with INR/Fingerstick  INR is 2.4 PT- 28.2  Therapeutic. Continue current dose of Coumadin. 3mg   every day except 2.5mg  Tuesday, Thursday, Sunday. Recheck in 4 weeks.  Signed, [address] [place], [place], [facility] 01/22/2013 8:21 AM

## 2013-01-25 ENCOUNTER — Other Ambulatory Visit: Payer: Self-pay | Admitting: Emergency Medicine

## 2013-01-25 ENCOUNTER — Telehealth: Payer: Self-pay | Admitting: Physician Assistant

## 2013-01-25 DIAGNOSIS — C50919 Malignant neoplasm of unspecified site of unspecified female breast: Secondary | ICD-10-CM

## 2013-01-25 DIAGNOSIS — C189 Malignant neoplasm of colon, unspecified: Secondary | ICD-10-CM

## 2013-01-25 NOTE — Telephone Encounter (Signed)
Please place this order  

## 2013-01-27 ENCOUNTER — Other Ambulatory Visit: Payer: Self-pay | Admitting: Physician Assistant

## 2013-01-27 DIAGNOSIS — Z853 Personal history of malignant neoplasm of breast: Secondary | ICD-10-CM

## 2013-01-27 NOTE — Telephone Encounter (Signed)
This is ordered already by Victorino December, MD

## 2013-01-31 ENCOUNTER — Telehealth: Payer: Self-pay | Admitting: Physician Assistant

## 2013-01-31 MED ORDER — FUROSEMIDE 20 MG PO TABS: 20.0000 mg | ORAL_TABLET | Freq: Every day | ORAL | Status: DC

## 2013-01-31 MED ORDER — DILTIAZEM HCL ER BEADS 240 MG PO CP24: 180.0000 mg | ORAL_CAPSULE | Freq: Every day | ORAL | Status: DC

## 2013-01-31 NOTE — Telephone Encounter (Signed)
Medication refilled per protocol. 

## 2013-02-02 ENCOUNTER — Telehealth: Payer: Self-pay | Admitting: Physician Assistant

## 2013-02-02 MED ORDER — DILTIAZEM HCL ER 180 MG PO CP24: 180.0000 mg | ORAL_CAPSULE | Freq: Every day | ORAL | Status: DC

## 2013-02-02 NOTE — Telephone Encounter (Signed)
Dose correction made  Refill sent

## 2013-02-09 ENCOUNTER — Telehealth: Payer: Self-pay | Admitting: Oncology

## 2013-02-09 ENCOUNTER — Other Ambulatory Visit (HOSPITAL_BASED_OUTPATIENT_CLINIC_OR_DEPARTMENT_OTHER): Payer: Medicare Other | Admitting: Lab

## 2013-02-09 ENCOUNTER — Other Ambulatory Visit: Payer: Medicare Other | Admitting: Lab

## 2013-02-09 ENCOUNTER — Ambulatory Visit: Payer: Medicare Other | Admitting: Oncology

## 2013-02-09 ENCOUNTER — Ambulatory Visit (HOSPITAL_BASED_OUTPATIENT_CLINIC_OR_DEPARTMENT_OTHER): Payer: Medicare Other | Admitting: Oncology

## 2013-02-09 VITALS — BP 127/70 | HR 99 | Temp 98.3°F | Resp 20 | Ht 65.0 in | Wt 233.3 lb

## 2013-02-09 DIAGNOSIS — C50919 Malignant neoplasm of unspecified site of unspecified female breast: Secondary | ICD-10-CM

## 2013-02-09 DIAGNOSIS — Z853 Personal history of malignant neoplasm of breast: Secondary | ICD-10-CM

## 2013-02-09 DIAGNOSIS — C189 Malignant neoplasm of colon, unspecified: Secondary | ICD-10-CM

## 2013-02-09 DIAGNOSIS — Z85038 Personal history of other malignant neoplasm of large intestine: Secondary | ICD-10-CM

## 2013-02-09 LAB — COMPREHENSIVE METABOLIC PANEL (CC13)
Albumin: 3.7 g/dL (ref 3.5–5.0)
CO2: 28 mEq/L (ref 22–29)
Chloride: 102 mEq/L (ref 98–107)
Glucose: 153 mg/dl — ABNORMAL HIGH (ref 70–99)
Potassium: 4.4 mEq/L (ref 3.5–5.1)
Sodium: 139 mEq/L (ref 136–145)
Total Protein: 8.1 g/dL (ref 6.4–8.3)

## 2013-02-09 LAB — CBC WITH DIFFERENTIAL/PLATELET
Eosinophils Absolute: 0.1 10*3/uL (ref 0.0–0.5)
MONO#: 0.7 10*3/uL (ref 0.1–0.9)
NEUT#: 2.9 10*3/uL (ref 1.5–6.5)
RBC: 4.42 10*6/uL (ref 3.70–5.45)
RDW: 13.9 % (ref 11.2–14.5)
WBC: 5.8 10*3/uL (ref 3.9–10.3)

## 2013-02-09 NOTE — Telephone Encounter (Signed)
, °

## 2013-02-09 NOTE — Patient Instructions (Addendum)
Return in 6 months for follow up regarding left breast discharge

## 2013-02-14 NOTE — Progress Notes (Signed)
Quick Note:  Please call patient: take vitamin D3 2000 units daily ______

## 2013-02-20 ENCOUNTER — Encounter: Payer: Self-pay | Admitting: Oncology

## 2013-02-21 ENCOUNTER — Telehealth: Payer: Self-pay | Admitting: Emergency Medicine

## 2013-02-21 NOTE — Telephone Encounter (Signed)
Spoke with patient; instructed her to start taking Vitamin D3 2000 units daily per Dr Welton Flakes. Patient verbalized understanding.

## 2013-02-26 ENCOUNTER — Ambulatory Visit (INDEPENDENT_AMBULATORY_CARE_PROVIDER_SITE_OTHER): Payer: BC Managed Care – HMO | Admitting: Physician Assistant

## 2013-02-26 ENCOUNTER — Encounter: Payer: Self-pay | Admitting: Physician Assistant

## 2013-02-26 VITALS — BP 164/76 | HR 64 | Temp 98.0°F | Resp 20 | Wt 233.0 lb

## 2013-02-26 DIAGNOSIS — R531 Weakness: Secondary | ICD-10-CM

## 2013-02-26 DIAGNOSIS — I209 Angina pectoris, unspecified: Secondary | ICD-10-CM

## 2013-02-26 DIAGNOSIS — R5381 Other malaise: Secondary | ICD-10-CM

## 2013-02-26 DIAGNOSIS — Z5181 Encounter for therapeutic drug level monitoring: Secondary | ICD-10-CM

## 2013-02-26 DIAGNOSIS — I4891 Unspecified atrial fibrillation: Secondary | ICD-10-CM

## 2013-02-26 DIAGNOSIS — Z7901 Long term (current) use of anticoagulants: Secondary | ICD-10-CM

## 2013-02-26 DIAGNOSIS — I208 Other forms of angina pectoris: Secondary | ICD-10-CM

## 2013-02-26 LAB — COMPLETE METABOLIC PANEL WITH GFR
Albumin: 4.1 g/dL (ref 3.5–5.2)
Alkaline Phosphatase: 83 U/L (ref 39–117)
BUN: 14 mg/dL (ref 6–23)
Creat: 0.69 mg/dL (ref 0.50–1.10)
GFR, Est Non African American: 85 mL/min
Glucose, Bld: 111 mg/dL — ABNORMAL HIGH (ref 70–99)
Potassium: 4.5 mEq/L (ref 3.5–5.3)

## 2013-02-26 LAB — CBC WITH DIFFERENTIAL/PLATELET
Basophils Absolute: 0 10*3/uL (ref 0.0–0.1)
Basophils Relative: 0 % (ref 0–1)
Eosinophils Absolute: 0.1 10*3/uL (ref 0.0–0.7)
Eosinophils Relative: 1 % (ref 0–5)
HCT: 38 % (ref 36.0–46.0)
Hemoglobin: 12.5 g/dL (ref 12.0–15.0)
MCH: 29.6 pg (ref 26.0–34.0)
MCHC: 32.9 g/dL (ref 30.0–36.0)
MCV: 90 fL (ref 78.0–100.0)
Monocytes Absolute: 0.9 10*3/uL (ref 0.1–1.0)
Monocytes Relative: 14 % — ABNORMAL HIGH (ref 3–12)
RDW: 14.2 % (ref 11.5–15.5)

## 2013-02-26 LAB — TSH: TSH: 1.046 u[IU]/mL (ref 0.350–4.500)

## 2013-02-26 LAB — PROTIME-INR: Prothrombin Time: 22.3 seconds — ABNORMAL HIGH (ref 11.6–15.2)

## 2013-02-26 MED ORDER — NITROGLYCERIN 0.4 MG SL SUBL: 0.4000 mg | SUBLINGUAL_TABLET | SUBLINGUAL | Status: DC | PRN

## 2013-02-26 NOTE — Progress Notes (Signed)
Patient ID: [name] MRN: [id number], DOB: 08-06-1935, 78 y.o. Date of Encounter: @DATE @  Chief Complaint:  Chief Complaint  Patient presents with  . c/o weakness in arms and legs    HPI: 78 y.o. year old white female  presents with complaint of "spells that make her weak". "Doubles me over. I get covered in a sweat."  After further questioning, she is exeriencing these episodes with exertion. Any time she does these activities "long enough" she feels symptoms. Activities: Working in the garden, sweeping, cleaning the house." With these "episodes" she feels tightness in left chest. Feels no discomfort of any type in neck or arms. She has diaphoresis- "covered in a sweat". She has no nausea or vomiting. She becomes very weak all the sudden. Has to sit down. "Takes 30 minutes for it to pass."  Says the first time she felt one of these episodes was 2-3 years ago. Now, " if she does any of those activities above for long enough, she will develop these symtoms." Last episode was last Thursday (4 days ago.). None today. None of these symptoms at present.    Past Medical History  Diagnosis Date  . Atrial fibrillation   . Cancer     right colon and left breast  . Breast cancer 01/06/2012  . Colon cancer 01/06/2012  . Hernia   . GERD (gastroesophageal reflux disease)   . Hypertension   . Allergy     rhinitis  . COPD (chronic obstructive pulmonary disease)   . Osteopenia   . Diverticulosis   . Hiatal hernia      Home Meds: See attached medication section for current medication list. Any medications entered into computer today will not appear on this note's list. The medications listed below were entered prior to today. Current Outpatient Prescriptions on File Prior to Visit  Medication Sig Dispense Refill  . cetirizine (ZYRTEC) 10 MG tablet Take 10 mg by mouth daily.      Marland Kitchen diltiazem (DILACOR XR) 180 MG 24 hr capsule Take 1 capsule (180 mg total) by mouth daily.  90 capsule  3  .  furosemide (LASIX) 20 MG tablet Take 1 tablet (20 mg total) by mouth daily.  90 tablet  1  . hydrOXYzine (ATARAX/VISTARIL) 25 MG tablet Take 25 mg by mouth 3 (three) times daily as needed for itching.      . isosorbide mononitrate (ISMO,MONOKET) 20 MG tablet Take 20 mg by mouth 2 (two) times daily.      Marland Kitchen losartan (COZAAR) 100 MG tablet Take 100 mg by mouth daily.      . Olopatadine HCl 0.2 % SOLN Apply 1 drop to eye every morning.  2.5 mL  11  . omeprazole (PRILOSEC) 10 MG capsule Take 10 mg by mouth daily.      . simvastatin (ZOCOR) 10 MG tablet Take 10 mg by mouth at bedtime.      . valsartan-hydrochlorothiazide (DIOVAN-HCT) 80-12.5 MG per tablet Take 1 tablet by mouth daily.      Marland Kitchen warfarin (COUMADIN) 2.5 MG tablet Take 2.5 mg by mouth daily.       No current facility-administered medications on file prior to visit.    Allergies:  Allergies  Allergen Reactions  . Contrast Media (Iodinated Diagnostic Agents) Other (See Comments)    Per pt strong burning sensation starting in chest radiating outward  . Celebrex (Celecoxib)   . Statins   . Tape Itching and Rash    Where applied and  will spread    History   Social History  . Marital Status: Married    Spouse Name: N/A    Number of Children: N/A  . Years of Education: N/A   Occupational History  . Not on file.   Social History Main Topics  . Smoking status: Never Smoker   . Smokeless tobacco: Never Used  . Alcohol Use: No  . Drug Use: No  . Sexually Active: Not Currently   Other Topics Concern  . Not on file   Social History Narrative  . No narrative on file    Family History  Problem Relation Age of Onset  . Heart disease Mother   . Cancer Sister     stomach and colon  . Cancer Brother     lung  . Heart disease Brother 60     Review of Systems:  See HPI for pertinent ROS. All other ROS negative.    Physical Exam: Blood pressure 164/76, pulse 64, temperature 98 F (36.7 C), temperature source Oral, resp.  rate 20, weight 233 lb (105.688 kg)., Body mass index is 38.77 kg/(m^2). BP by me: 160/80 on right. Says she has taken meds today.  General: Overweight WF. Appears in NO distress. Neck: Supple. No thyromegaly. No lymphadenopathy.No carotid bruit Lungs: Clear bilaterally to auscultation without wheezes, rales, or rhonchi. Breathing is unlabored. Heart: Irregular rhythm. No murmur. Abdomen: Soft, non-tender, non-distended with normoactive bowel sounds. No hepatomegaly. No rebound/guarding. No obvious abdominal masses. Musculoskeletal:  Strength and tone normal for age. Extremities/Skin: Warm and dry. No clubbing or cyanosis. No edema. No rashes or suspicious lesions. Neuro: Alert and oriented X 3. Moves all extremities spontaneously. Gait is normal. CNII-XII grossly in tact. Psych:  Responds to questions appropriately with a normal affect.   EKG: A. Fib. Non specific ST/T changes.   ASSESSMENT AND PLAN:  78 y.o. year old female with  1. Stable angina Will check labs to R/O / assess for other etiologies. But, sounds c/w angina. I have called [facility]. Dr. Mayford Knife (her Cardiologist) can see her today at 3:45 pm. Pt was notified this while here at [facility]. She is to go home and rest. Do NO exertion.  Instructed on proper use of Ntg if needed.Also reviewed indications for her to call 9-1-1 if needed.  F/U with Dr. Mayford Knife today. If these episodes end up not being cardiac in etiology, then can f/u here for further eval after cardiac eval completed. She tells me she has had 2 cardiac caths in past.  - CBC with Differential - COMPLETE METABOLIC PANEL WITH GFR - TSH - Protime-INR - nitroGLYCERIN (NITROSTAT) 0.4 MG SL tablet; Place 1 tablet (0.4 mg total) under the tongue every 5 (five) minutes as needed for chest pain.  Dispense: 50 tablet; Refill: 3 - EKG 12-Lead  2. Weakness  - CBC with Differential - COMPLETE METABOLIC PANEL WITH GFR - TSH - Protime-INR  3. Atrial fibrillation She comes  here routinely for INR checks. Will go ahead and check with labs now.  - Protime-INR - EKG 12-Lead  4. Anticoagulated on Coumadin - Protime-INR  5. HTN: BP is high today but has been good at recent checks. 02/09/13-Dr. Kahn--BP was 127/70.             01/22/13-Here: 140/72 i will not chang meds now-she is seeing Dr. Mayford Knife this afternoon.    Signed, [address] [place], [place], [facility] 02/26/2013 11:55 AM

## 2013-02-28 ENCOUNTER — Telehealth: Payer: Self-pay | Admitting: Family Medicine

## 2013-02-28 NOTE — Telephone Encounter (Signed)
Message copied by Donne Anon on Wed Feb 28, 2013  9:18 AM ------      Message from: Allayne Butcher      Created: Tue Feb 27, 2013  1:53 PM       Tell pt that all labs normal. -CBC, CMET, TSH.      Tell her that INR is 2.03-Good. Continue current dose Coumadin.Recheck 4-6 weeks, as usual.      What did Dr. Mayford Knife say? (Pt saw me yesterday morning-symptoms concerning for angina. I called and scheduled her appt to see Dr. Larkin Ina cardiologist-yesterday afternoon)Let me know. ------

## 2013-03-02 ENCOUNTER — Telehealth: Payer: Self-pay | Admitting: Family Medicine

## 2013-03-02 NOTE — Telephone Encounter (Signed)
Pt returned my call.  Told about labs and f/u for PT/INR in 4-6 weeks.  Said saw Dr Mayford Knife and has follow up with him on Monday to get results??

## 2013-03-02 NOTE — Telephone Encounter (Signed)
Message copied by Donne Anon on Fri Mar 02, 2013 12:06 PM ------      Message from: Allayne Butcher      Created: Tue Feb 27, 2013  1:53 PM       Tell pt that all labs normal. -CBC, CMET, TSH.      Tell her that INR is 2.03-Good. Continue current dose Coumadin.Recheck 4-6 weeks, as usual.      What did Dr. Mayford Knife say? (Pt saw me yesterday morning-symptoms concerning for angina. I called and scheduled her appt to see Dr. Larkin Ina cardiologist-yesterday afternoon)Let me know. ------

## 2013-03-04 NOTE — Progress Notes (Signed)
OFFICE PROGRESS NOTE  CC  [name], PA-C [address] [place] [address] Dr. Chevis Pretty  DIAGNOSIS: 78 year old female with  History of stage II colon carcinoma originally diagnosed September 2003.  #2 history of invasive ductal carcinoma of the left breast diagnosed March 2008 status post lumpectomy with sentinel node dissection for a stage I ER positive PR positive HER-2/neu negative breast cancer.  PRIOR THERAPY:  #1 patient with history of stage II colon carcinoma on observation only without evidence of recurrent disease patient is now 10 years out since diagnosis.  #2 stage I invasive ductal carcinoma of the left breast. Patient has received tamoxifen and Aromasin in the past she has completed a total of 4 years of therapy. Her therapy was discontinued due to nipple discharge and patient declined any further treatments for her breast cancer.  CURRENT THERAPY:Observation  INTERVAL HISTORY: [name] 78 y.o. female returns for Followup visit today.she denies any fevers chills headaches no diarrhea or constipation no nausea or vomiting no bleeding she has not noticed any masses in her right or left breast. Remainder of the 10 point review of systems is negative.  MEDICAL HISTORY: Past Medical History  Diagnosis Date  . Atrial fibrillation   . Cancer     right colon and left breast  . Breast cancer 01/06/2012  . Colon cancer 01/06/2012  . Hernia   . GERD (gastroesophageal reflux disease)   . Hypertension   . Allergy     rhinitis  . COPD (chronic obstructive pulmonary disease)   . Osteopenia   . Diverticulosis   . Hiatal hernia     ALLERGIES:  is allergic to contrast media; celebrex; statins; and tape.  MEDICATIONS:  Current Outpatient Prescriptions  Medication Sig Dispense Refill  . diltiazem (DILACOR XR) 180 MG 24 hr capsule Take 1 capsule (180 mg total) by mouth daily.  90 capsule  3  . furosemide (LASIX) 20 MG tablet Take 1 tablet (20 mg total)  by mouth daily.  90 tablet  1  . isosorbide mononitrate (ISMO,MONOKET) 20 MG tablet Take 20 mg by mouth 2 (two) times daily.      Marland Kitchen losartan (COZAAR) 100 MG tablet Take 100 mg by mouth daily.      Marland Kitchen omeprazole (PRILOSEC) 10 MG capsule Take 10 mg by mouth daily.      . simvastatin (ZOCOR) 10 MG tablet Take 10 mg by mouth at bedtime.      . valsartan-hydrochlorothiazide (DIOVAN-HCT) 80-12.5 MG per tablet Take 1 tablet by mouth daily.      Marland Kitchen warfarin (COUMADIN) 2.5 MG tablet Take 2.5 mg by mouth daily.      . cetirizine (ZYRTEC) 10 MG tablet Take 10 mg by mouth daily.      . Cholecalciferol (VITAMIN D) 2000 UNITS CAPS Take 1 capsule by mouth daily.      . hydrOXYzine (ATARAX/VISTARIL) 25 MG tablet Take 25 mg by mouth 3 (three) times daily as needed for itching.      . nitroGLYCERIN (NITROSTAT) 0.4 MG SL tablet Place 1 tablet (0.4 mg total) under the tongue every 5 (five) minutes as needed for chest pain.  50 tablet  3  . Olopatadine HCl 0.2 % SOLN Apply 1 drop to eye every morning.  2.5 mL  11   No current facility-administered medications for this visit.    SURGICAL HISTORY:  Past Surgical History  Procedure Laterality Date  . Colectomy      right  side  . Mastectomy partial / lumpectomy  2008    left breast  . Cardiac catheterization      REVIEW OF SYSTEMS:  Pertinent items are noted in HPI.   PHYSICAL EXAMINATION: General appearance: alert, cooperative and appears stated age Lymph nodes: Cervical, supraclavicular, and axillary nodes normal. Resp: clear to auscultation bilaterally and normal percussion bilaterally Back: symmetric, no curvature. ROM normal. No CVA tenderness. Cardio: regular rate and rhythm, S1, S2 normal, no murmur, click, rub or gallop GI: soft, non-tender; bowel sounds normal; no masses,  no organomegaly Extremities: extremities normal, atraumatic, no cyanosis or edema Neurologic: Grossly normal Bilateral breast examination left breast reveals well-healed surgical  scar no nodularity no evidence of local recurrence no nipple discharge or masses. Right breast no masses or nipple discharge. ECOG PERFORMANCE STATUS: 1 - Symptomatic but completely ambulatory  Blood pressure 127/70, pulse 99, temperature 98.3 F (36.8 C), temperature source Oral, resp. rate 20, height 5\' 5"  (1.651 m), weight 233 lb 4.8 oz (105.824 kg).  LABORATORY DATA: Lab Results  Component Value Date   WBC 6.1 02/26/2013   HGB 12.5 02/26/2013   HCT 38.0 02/26/2013   MCV 90.0 02/26/2013   PLT 244 02/26/2013      Chemistry      Component Value Date/Time   NA 135 02/26/2013 1009   NA 139 02/09/2013 0947   NA 140 04/09/2010 1247   K 4.5 02/26/2013 1009   K 4.4 02/09/2013 0947   K 4.1 04/09/2010 1247   CL 104 02/26/2013 1009   CL 102 02/09/2013 0947   CL 100 04/09/2010 1247   CO2 26 02/26/2013 1009   CO2 28 02/09/2013 0947   CO2 26 04/09/2010 1247   BUN 14 02/26/2013 1009   BUN 16.9 02/09/2013 0947   BUN 11 04/09/2010 1247   CREATININE 0.69 02/26/2013 1009   CREATININE 0.9 02/09/2013 0947   CREATININE 0.76 01/06/2012 1001      Component Value Date/Time   CALCIUM 9.5 02/26/2013 1009   CALCIUM 9.8 02/09/2013 0947   CALCIUM 9.3 04/09/2010 1247   ALKPHOS 83 02/26/2013 1009   ALKPHOS 98 02/09/2013 0947   ALKPHOS 41 04/09/2010 1247   AST 17 02/26/2013 1009   AST 19 02/09/2013 0947   AST 22 04/09/2010 1247   ALT 14 02/26/2013 1009   ALT 17 02/09/2013 0947   BILITOT 0.5 02/26/2013 1009   BILITOT 0.66 02/09/2013 0947   BILITOT 0.40 04/09/2010 1247       RADIOGRAPHIC STUDIES:  No results found.  ASSESSMENT: 78 year old female with  #1 history of stage II colon cancer back in 2003. Patient is without evidence of local recurrence.  #2 stage I invasive ductal carcinoma of the left breast status post lumpectomy followed by radiation and then adjuvant tamoxifen followed by Aromasin for a total of 4 years of therapy. Patient declined therapy thereafter. She has no evidence of local recurrence.  #3 lower abdominal pain  unclear etiology. This has resolved.   PLAN:   #1 patient will continue to be followed by me on a every 6 month basis    #2 she knows to call with any problems questions or concerns.   All questions were answered. The patient knows to call the clinic with any problems, questions or concerns. We can certainly see the patient much sooner if necessary.  I spent 20 minutes counseling the patient face to face. The total time spent in the appointment was 30 minutes.    Drue Second,  MD Medical/Oncology [facility] [phone] (beeper) [phone] (Office)

## 2013-03-05 ENCOUNTER — Other Ambulatory Visit: Payer: Self-pay | Admitting: Physician Assistant

## 2013-03-07 ENCOUNTER — Other Ambulatory Visit: Payer: Self-pay | Admitting: Physician Assistant

## 2013-03-22 ENCOUNTER — Ambulatory Visit (INDEPENDENT_AMBULATORY_CARE_PROVIDER_SITE_OTHER): Payer: BC Managed Care – HMO | Admitting: Family Medicine

## 2013-03-22 ENCOUNTER — Encounter: Payer: Self-pay | Admitting: Family Medicine

## 2013-03-22 VITALS — BP 110/60 | HR 66 | Temp 98.0°F | Resp 16 | Wt 239.0 lb

## 2013-03-22 DIAGNOSIS — H9209 Otalgia, unspecified ear: Secondary | ICD-10-CM

## 2013-03-22 DIAGNOSIS — H9201 Otalgia, right ear: Secondary | ICD-10-CM

## 2013-03-22 MED ORDER — AMOXICILLIN 875 MG PO TABS: 875.0000 mg | ORAL_TABLET | Freq: Two times a day (BID) | ORAL | Status: DC

## 2013-03-22 NOTE — Progress Notes (Signed)
Subjective:    Patient ID: [name], female    DOB: 1935-04-19, 78 y.o.   MRN: [id number]  HPI Patient is here with 3 days of severe otalgia.  She reports sharp pain in her right ear. She denies fevers chills or recent upper respiratory infection. She denies itching in the auditory canal. She denies discharge from the auditory canal. She denies symptoms of TMJ. She denies symptoms of trigeminal neuralgia.  She is adamant that the last time her ear felt this way, she received antibiotics and the pain subsided.  He is requesting abx. Past Medical History  Diagnosis Date  . Atrial fibrillation   . Cancer     right colon and left breast  . Breast cancer 01/06/2012  . Colon cancer 01/06/2012  . Hernia   . GERD (gastroesophageal reflux disease)   . Hypertension   . Allergy     rhinitis  . COPD (chronic obstructive pulmonary disease)   . Osteopenia   . Diverticulosis   . Hiatal hernia    Current Outpatient Prescriptions on File Prior to Visit  Medication Sig Dispense Refill  . cetirizine (ZYRTEC) 10 MG tablet Take 10 mg by mouth daily.      . Cholecalciferol (VITAMIN D) 2000 UNITS CAPS Take 1 capsule by mouth daily.      Marland Kitchen diltiazem (DILACOR XR) 180 MG 24 hr capsule Take 1 capsule (180 mg total) by mouth daily.  90 capsule  3  . furosemide (LASIX) 20 MG tablet Take 1 tablet (20 mg total) by mouth daily.  90 tablet  1  . hydrOXYzine (ATARAX/VISTARIL) 25 MG tablet Take 25 mg by mouth 3 (three) times daily as needed for itching.      . isosorbide mononitrate (ISMO,MONOKET) 20 MG tablet Take 20 mg by mouth 2 (two) times daily.      Marland Kitchen losartan (COZAAR) 100 MG tablet Take 100 mg by mouth daily.      . nitroGLYCERIN (NITROSTAT) 0.4 MG SL tablet Place 1 tablet (0.4 mg total) under the tongue every 5 (five) minutes as needed for chest pain.  50 tablet  3  . Olopatadine HCl 0.2 % SOLN Apply 1 drop to eye every morning.  2.5 mL  11  . omeprazole (PRILOSEC) 10 MG capsule Take 10 mg by mouth daily.       . simvastatin (ZOCOR) 10 MG tablet Take 10 mg by mouth at bedtime.      . valsartan-hydrochlorothiazide (DIOVAN-HCT) 80-12.5 MG per tablet Take 1 tablet by mouth daily.      Marland Kitchen warfarin (COUMADIN) 2.5 MG tablet Take 2.5 mg by mouth daily.       No current facility-administered medications on file prior to visit.   Allergies  Allergen Reactions  . Contrast Media (Iodinated Diagnostic Agents) Other (See Comments)    Per pt strong burning sensation starting in chest radiating outward  . Celebrex (Celecoxib)   . Statins   . Tape Itching and Rash    Where applied and will spread   History   Social History  . Marital Status: Married    Spouse Name: N/A    Number of Children: N/A  . Years of Education: N/A   Occupational History  . Not on file.   Social History Main Topics  . Smoking status: Never Smoker   . Smokeless tobacco: Never Used  . Alcohol Use: No  . Drug Use: No  . Sexually Active: Not Currently   Other Topics Concern  .  Not on file   Social History Narrative  . No narrative on file      Review of Systems  All other systems reviewed and are negative.       Objective:   Physical Exam  Vitals reviewed. Constitutional: She appears well-developed and well-nourished.  HENT:  Right Ear: Tympanic membrane, external ear and ear canal normal.  Left Ear: Tympanic membrane, external ear and ear canal normal.  Neck: Normal range of motion. Neck supple. No tracheal deviation present. No thyromegaly present.  Cardiovascular: Normal rate and normal heart sounds.   Pulmonary/Chest: Effort normal and breath sounds normal. No respiratory distress. She has no wheezes. She has no rales.  Lymphadenopathy:    She has no cervical adenopathy.          Assessment & Plan:  1. Otalgia, right Exam is completely normal. I believe the patient's pain is neurogenic in nature possibly from cervical disc disease or even atypical trigeminal neuralgia.  However she would like to  try antibiotics first prior to trying medicine for neuropathic pain given the history of previous response to antibiotics.  I feel this is reasonable. We'll try amoxicillin 875 mg by mouth twice a day for 10 days. She's to call me if his symptoms do not improve. - amoxicillin (AMOXIL) 875 MG tablet; Take 1 tablet (875 mg total) by mouth 2 (two) times daily.  Dispense: 20 tablet; Refill: 0

## 2013-05-03 ENCOUNTER — Ambulatory Visit (INDEPENDENT_AMBULATORY_CARE_PROVIDER_SITE_OTHER): Payer: BC Managed Care – HMO | Admitting: Physician Assistant

## 2013-05-03 ENCOUNTER — Encounter: Payer: Self-pay | Admitting: Physician Assistant

## 2013-05-03 VITALS — BP 122/66 | HR 72 | Temp 98.4°F | Resp 18 | Wt 234.0 lb

## 2013-05-03 DIAGNOSIS — Z7901 Long term (current) use of anticoagulants: Secondary | ICD-10-CM

## 2013-05-03 DIAGNOSIS — I4891 Unspecified atrial fibrillation: Secondary | ICD-10-CM

## 2013-05-03 NOTE — Progress Notes (Signed)
Patient ID: [name] MRN: [id number], DOB: 1934/11/22, 78 y.o. Date of Encounter: 05/03/2013, 2:28 PM    Chief Complaint:  Chief Complaint  Patient presents with  . Coumadin PT/INR check     HPI: 78 y.o. year old female here to recheck her PT/INR. She is taking her Coumadin as directed with 3 mg every day except for 2.5 mg on Tuesday Thursday and Sunday. She has had no known skipped doses. She has noticed no bleeding no melena or hematochezia. She has had a steady intake of vitamin K in her diet.  Home Meds: See attached medication section for any medications that were entered at today's visit. The computer does not put those onto this list.The following list is a list of meds entered prior to today's visit.   Current Outpatient Prescriptions on File Prior to Visit  Medication Sig Dispense Refill  . cetirizine (ZYRTEC) 10 MG tablet Take 10 mg by mouth daily.      . Cholecalciferol (VITAMIN D) 2000 UNITS CAPS Take 1 capsule by mouth daily.      Marland Kitchen diltiazem (DILACOR XR) 180 MG 24 hr capsule Take 1 capsule (180 mg total) by mouth daily.  90 capsule  3  . furosemide (LASIX) 20 MG tablet Take 1 tablet (20 mg total) by mouth daily.  90 tablet  1  . hydrOXYzine (ATARAX/VISTARIL) 25 MG tablet Take 25 mg by mouth 3 (three) times daily as needed for itching.      . isosorbide mononitrate (ISMO,MONOKET) 20 MG tablet Take 20 mg by mouth 2 (two) times daily.      Marland Kitchen losartan (COZAAR) 100 MG tablet Take 100 mg by mouth daily.      . nitroGLYCERIN (NITROSTAT) 0.4 MG SL tablet Place 1 tablet (0.4 mg total) under the tongue every 5 (five) minutes as needed for chest pain.  50 tablet  3  . Olopatadine HCl 0.2 % SOLN Apply 1 drop to eye every morning.  2.5 mL  11  . omeprazole (PRILOSEC) 10 MG capsule Take 10 mg by mouth daily.      . simvastatin (ZOCOR) 10 MG tablet Take 10 mg by mouth at bedtime.      . valsartan-hydrochlorothiazide (DIOVAN-HCT) 80-12.5 MG per tablet Take 1 tablet by mouth daily.       Marland Kitchen warfarin (COUMADIN) 2.5 MG tablet Take 2.5 mg by mouth daily.       No current facility-administered medications on file prior to visit.    Allergies:  Allergies  Allergen Reactions  . Contrast Media [Iodinated Diagnostic Agents] Other (See Comments)    Per pt strong burning sensation starting in chest radiating outward  . Celebrex [Celecoxib]   . Statins   . Tape Itching and Rash    Where applied and will spread      Review of Systems: See HPI for pertinent ROS. All other ROS negative.    Physical Exam: Blood pressure 122/66, pulse 72, temperature 98.4 F (36.9 C), temperature source Oral, resp. rate 18, weight 234 lb (106.142 kg)., Body mass index is 38.94 kg/(m^2). General: well-nourished well-developed white female.  Appears in no acute distress. Lungs: Clear bilaterally to auscultation without wheezes, rales, or rhonchi. Breathing is unlabored. Heart: Irregular rhythm. Msk:  Strength and tone normal for age. Extremities/Skin: Warm and dry.  No edema.  Neuro: Alert and oriented X 3. Moves all extremities spontaneously. Gait is normal. CNII-XII grossly in tact. Psych:  Responds to questions appropriately with a normal affect.  Results for orders placed in visit on 05/03/13  PT WITH INR/FINGERSTICK      Result Value Range   PT, fingerstick 33.1 (*) 10.4 - 12.5 seconds   INR, fingerstick 2.8 (*) 0.80 - 1.20     ASSESSMENT AND PLAN:  78 y.o. year old female with  1. Long term (current) use of anticoagulants - PT with INR/Fingerstick  2. Atrial fibrillation  INR in therapeutic range at 2.8. Continue current dose of Coumadin at 3 mg daily except for 2.5 mg on Tuesday Thursday Sunday. 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  [name], [place], [facility] 05/03/2013 2:28 PM

## 2013-05-15 ENCOUNTER — Encounter (INDEPENDENT_AMBULATORY_CARE_PROVIDER_SITE_OTHER): Payer: Self-pay | Admitting: General Surgery

## 2013-05-15 ENCOUNTER — Encounter (INDEPENDENT_AMBULATORY_CARE_PROVIDER_SITE_OTHER): Payer: Self-pay

## 2013-05-15 ENCOUNTER — Ambulatory Visit (INDEPENDENT_AMBULATORY_CARE_PROVIDER_SITE_OTHER): Payer: Medicare Other | Admitting: General Surgery

## 2013-05-15 VITALS — BP 140/72 | HR 64 | Resp 14 | Ht 67.0 in | Wt 234.4 lb

## 2013-05-15 DIAGNOSIS — N6489 Other specified disorders of breast: Secondary | ICD-10-CM

## 2013-05-15 DIAGNOSIS — N649 Disorder of breast, unspecified: Secondary | ICD-10-CM

## 2013-05-15 NOTE — Patient Instructions (Addendum)
Plan for bilateral nipple biopsy once we have cardiac clearance

## 2013-05-17 ENCOUNTER — Telehealth (INDEPENDENT_AMBULATORY_CARE_PROVIDER_SITE_OTHER): Payer: Self-pay

## 2013-05-17 ENCOUNTER — Other Ambulatory Visit (INDEPENDENT_AMBULATORY_CARE_PROVIDER_SITE_OTHER): Payer: Self-pay | Admitting: General Surgery

## 2013-05-17 NOTE — Telephone Encounter (Signed)
Pattricia Boss at [facility] cardiology called stating Dr T. Turner has given clearance for pt to proceed with surgery. She has faxed clearance to [phone]. I advised her I will send msg to [facility] to watch for note.

## 2013-05-21 NOTE — Progress Notes (Signed)
Patient ID: [name], female   DOB: 09/04/1935, 78 y.o.   MRN: [id number]  Chief Complaint  Patient presents with  . New Evaluation    est patient new prob, lt br nipple draining    HPI [name] is a 78 y.o. female.  The patient is a 78-year-old white female who is 6 years status post left breast lumpectomy for breast cancer an 11 year status post right colectomy. Over the last couple her she has noticed some clear yellowish discharge from her left nipple. Over the last few months she has been noticing that occurring every day. She denies any bloody nipple discharge. She denies any pain in the breast. She does have some soreness associated with the nipple and some itching as well.  HPI  Past Medical History  Diagnosis Date  . Atrial fibrillation   . Cancer     right colon and left breast  . Breast cancer 01/06/2012  . Colon cancer 01/06/2012  . Hernia   . GERD (gastroesophageal reflux disease)   . Hypertension   . Allergy     rhinitis  . COPD (chronic obstructive pulmonary disease)   . Osteopenia   . Diverticulosis   . Hiatal hernia   . Hyperlipidemia     Past Surgical History  Procedure Laterality Date  . Colectomy      right side  . Mastectomy partial / lumpectomy  2008    left breast  . Cardiac catheterization      Family History  Problem Relation Age of Onset  . Heart disease Mother   . Cancer Sister     stomach and colon  . Cancer Brother     lung  . Heart disease Brother 60    Social History History  Substance Use Topics  . Smoking status: Never Smoker   . Smokeless tobacco: Never Used  . Alcohol Use: No    Allergies  Allergen Reactions  . Contrast Media [Iodinated Diagnostic Agents] Other (See Comments)    Per pt strong burning sensation starting in chest radiating outward  . Celebrex [Celecoxib]   . Statins   . Tape Itching and Rash    Where applied and will spread    Current Outpatient Prescriptions  Medication Sig Dispense Refill  .  diltiazem (DILACOR XR) 180 MG 24 hr capsule Take 1 capsule (180 mg total) by mouth daily.  90 capsule  3  . furosemide (LASIX) 20 MG tablet Take 1 tablet (20 mg total) by mouth daily.  90 tablet  1  . isosorbide mononitrate (ISMO,MONOKET) 20 MG tablet Take 20 mg by mouth 2 (two) times daily.      . losartan (COZAAR) 100 MG tablet Take 100 mg by mouth daily.      . nitroGLYCERIN (NITROSTAT) 0.4 MG SL tablet Place 1 tablet (0.4 mg total) under the tongue every 5 (five) minutes as needed for chest pain.  50 tablet  3  . Olopatadine HCl 0.2 % SOLN Apply 1 drop to eye every morning.  2.5 mL  11  . omeprazole (PRILOSEC) 10 MG capsule Take 10 mg by mouth daily.      . simvastatin (ZOCOR) 10 MG tablet Take 10 mg by mouth at bedtime.      . valsartan-hydrochlorothiazide (DIOVAN-HCT) 80-12.5 MG per tablet Take 1 tablet by mouth daily.      . warfarin (COUMADIN) 2.5 MG tablet Take 2.5 mg by mouth daily.      . cetirizine (ZYRTEC) 10   MG tablet Take 10 mg by mouth daily.      . Cholecalciferol (VITAMIN D) 2000 UNITS CAPS Take 1 capsule by mouth daily.       No current facility-administered medications for this visit.    Review of Systems Review of Systems  Constitutional: Negative.   HENT: Negative.   Eyes: Negative.   Respiratory: Negative.   Cardiovascular: Negative.   Gastrointestinal: Negative.   Endocrine: Negative.   Genitourinary: Negative.   Musculoskeletal: Negative.   Skin: Negative.   Allergic/Immunologic: Negative.   Neurological: Negative.   Hematological: Negative.   Psychiatric/Behavioral: Negative.     Blood pressure 140/72, pulse 64, resp. rate 14, height 5' 7" (1.702 m), weight 234 lb 6.4 oz (106.323 kg).  Physical Exam Physical Exam  Constitutional: She is oriented to person, place, and time. She appears well-developed and well-nourished.  HENT:  Head: Normocephalic and atraumatic.  Eyes: Conjunctivae and EOM are normal. Pupils are equal, round, and reactive to light.   Neck: Normal range of motion. Neck supple.  Cardiovascular: Normal rate, regular rhythm and normal heart sounds.   Pulmonary/Chest: Effort normal and breath sounds normal.  There is no palpable mass in either breast. There is no palpable axillary, supraclavicular, or cervical lymphadenopathy. She does have some redness and irritation and rashes associated with the left nipple. There is no single duct from which we can express any fluid. On the right nipple she appears to have something that looks like a cutaneous horn measuring just a couple millimeters across  Abdominal: Soft. Bowel sounds are normal. She exhibits no mass. There is no tenderness.  Musculoskeletal: Normal range of motion.  Lymphadenopathy:    She has no cervical adenopathy.  Neurological: She is alert and oriented to person, place, and time.  Skin: Skin is warm and dry.  Psychiatric: She has a normal mood and affect. Her behavior is normal.    Data Reviewed As above  Assessment    The patient is 6 years status post left breast lumpectomy for breast cancer and now has a new rash involving the nipple. This certainly does raise concern for possible Paget's disease. Given the abnormality of both nipples I think it would be reasonable to do bilateral nipple biopsies to rule out more significant disease. I have discussed with her in detail the risks and benefits of the operation to do this as well as some of the technical aspects and she understands and wishes to proceed     Plan    Plan for bilateral nipple biopsy once we have cardiac clearance        TOTH III,Graceanna Theissen S 05/21/2013, 8:21 AM    

## 2013-05-22 ENCOUNTER — Encounter (INDEPENDENT_AMBULATORY_CARE_PROVIDER_SITE_OTHER): Payer: Self-pay

## 2013-05-24 ENCOUNTER — Encounter (HOSPITAL_COMMUNITY): Payer: Self-pay

## 2013-05-28 ENCOUNTER — Telehealth (INDEPENDENT_AMBULATORY_CARE_PROVIDER_SITE_OTHER): Payer: Self-pay | Admitting: *Deleted

## 2013-05-28 ENCOUNTER — Encounter (HOSPITAL_COMMUNITY): Payer: Self-pay

## 2013-05-28 ENCOUNTER — Encounter (HOSPITAL_COMMUNITY)
Admission: RE | Admit: 2013-05-28 | Discharge: 2013-05-28 | Disposition: A | Discharge status: Home / Self Care | Payer: Medicare Other | Source: Ambulatory Visit | Attending: Anesthesiology | Admitting: Anesthesiology

## 2013-05-28 ENCOUNTER — Encounter (HOSPITAL_COMMUNITY)
Admission: RE | Admit: 2013-05-28 | Discharge: 2013-05-28 | Disposition: A | Discharge status: Home / Self Care | Payer: Medicare Other | Source: Ambulatory Visit | Attending: General Surgery | Admitting: General Surgery

## 2013-05-28 HISTORY — DX: Pneumonia, unspecified organism: J18.9

## 2013-05-28 HISTORY — DX: Unspecified osteoarthritis, unspecified site: M19.90

## 2013-05-28 LAB — BASIC METABOLIC PANEL
BUN: 14 mg/dL (ref 6–23)
Chloride: 99 mEq/L (ref 96–112)
Creatinine, Ser: 0.7 mg/dL (ref 0.50–1.10)
GFR calc Af Amer: >90 mL/min (ref >90)
GFR calc non Af Amer: 82 mL/min — ABNORMAL LOW (ref >90)

## 2013-05-28 LAB — PROTIME-INR
INR: 2.11 — ABNORMAL HIGH (ref 0.00–1.49)
Prothrombin Time: 23 seconds — ABNORMAL HIGH (ref 11.6–15.2)

## 2013-05-28 LAB — APTT: aPTT: 38 seconds — ABNORMAL HIGH (ref 24–37)

## 2013-05-28 LAB — CBC
HCT: 39.4 % (ref 36.0–46.0)
MCHC: 34.3 g/dL (ref 30.0–36.0)
Platelets: 232 10*3/uL (ref 150–400)
RDW: 14.1 % (ref 11.5–15.5)
WBC: 7 10*3/uL (ref 4.0–10.5)

## 2013-05-28 NOTE — Telephone Encounter (Signed)
Patient called in this morning asking if she is suppose to stop her Coumadin prior to surgery.  Informed patient that yes she needs to be off her Coumadin.  Patient states she last took it Saturday night.  Explained that we usually have people off of it for 5 days but I will send a message to Dr. Carolynne Edouard to make sure this is still ok.  Patient is having biopsy of bilateral nipples on 05/30/13.

## 2013-05-28 NOTE — Pre-Procedure Instructions (Addendum)
[name]  05/28/2013   Your procedure is scheduled on: 05/30/13  Report to [facility] short stay admitting [employer] [employer] [address] at 630AM.  Call this number if you have problems the morning of surgery: [phone]   Remember:   Do not eat food or drink liquids after midnight.   Take these medicines the morning of surgery with A SIP OF WATER: diltiazem, isosorbide, omeprazole,       nitro if needed              STOP coumadin per dr   Drucilla Schmidt not wear jewelry, make-up or nail polish.  Do not wear lotions, powders, or perfumes. You Estrin wear deodorant.  Do not shave 48 hours prior to surgery. Men Farinas shave face and neck.  Do not bring valuables to the hospital.  [facility] is not responsible                   for any belongings or valuables.  Contacts, dentures or bridgework Lutzke not be worn into surgery.  Leave suitcase in the car. After surgery it Gouger be brought to your room.  For patients admitted to the hospital, checkout time is 11:00 AM the day of  discharge.   Patients discharged the day of surgery will not be allowed to drive  home.  Name and phone number of your driver:   Special Instructions: Shower using CHG 2 nights before surgery and the night before surgery.  If you shower the day of surgery use CHG.  Use special wash - you have one bottle of CHG for all showers.  You should use approximately 1/3 of the bottle for each shower.   Please read over the following fact sheets that you were given: Pain Booklet, Coughing and Deep Breathing and Surgical Site Infection Prevention

## 2013-05-29 MED ORDER — CHLORHEXIDINE GLUCONATE 4 % EX LIQD: 1.0000 "application " | Freq: Once | CUTANEOUS | Status: DC

## 2013-05-29 MED ORDER — CEFAZOLIN SODIUM-DEXTROSE 2-3 GM-% IV SOLR
2.0000 g | INTRAVENOUS | Status: AC
  Administered 2013-05-30: 2 g via INTRAVENOUS
  Filled 2013-05-29: qty 50

## 2013-05-29 NOTE — Progress Notes (Signed)
Anesthesia Chart Review:  Patient is a 78 year old female scheduled for bilateral nipple biopsy on 05/30/13 by Dr. Carolynne Edouard.  History includes nonsmoker, obesity, atrial fibrillation, mild non-obstructive CAD by cath '06, breast cancer s/p left lumpectomy '08, stage II colon cancer s/p right colectomy '03, hypertension, GERD, hiatal hernia, COPD, hyperlipidemia, arthritis. PCP is Allayne Butcher, PA-C.  Cardiologist is Dr. Armanda Magic who cleared patient for this procedure.  Nuclear stress test on 02/28/13 showed normal myocardial perfusion with attenuation artifact in the anterior region of the myocardium.  No ischemia or infarct/scar in the remaining myocardium.  Non-gated.  Echo on 04/30/10 showed normal LV size and function, mild concentric LVH, no regional wall motion abnormalities, LVEF 54.4%.  Mild biatrial enlargement, mild MR, mild to moderate TR, mild PI. AV sclerosis, but opens well.  Cardiac cath on 03/04/05 showed 20-30% distal LM, 20-30% ostial LAD in on view only, otherwise normal arteries, EF 65%.    EKG on 02/26/13 showed rate controlled afib, non-specific ST abnormality.  CXR on 05/28/13 showed no active cardiopulmonary disease.  Preoperative labs noted. Coumadin on hold since 05/26/13.  Repeat PT/PTT on the day of surgery.  If follow-up labs acceptable and otherwise no acute changes then I would anticipate that she could proceed as planned.  Velna Ochs [facility]/Anesthesiology Phone [phone] 05/29/2013 9:38 AM

## 2013-05-30 ENCOUNTER — Encounter (HOSPITAL_COMMUNITY)
Admission: RE | Disposition: A | Discharge status: Home / Self Care | Payer: Self-pay | Source: Ambulatory Visit | Attending: General Surgery

## 2013-05-30 ENCOUNTER — Encounter (HOSPITAL_COMMUNITY): Payer: Self-pay | Admitting: Vascular Surgery

## 2013-05-30 ENCOUNTER — Ambulatory Visit (HOSPITAL_COMMUNITY)
Admission: RE | Admit: 2013-05-30 | Discharge: 2013-05-30 | Disposition: A | Discharge status: Home / Self Care | Payer: Medicare Other | Source: Ambulatory Visit | Attending: General Surgery | Admitting: General Surgery

## 2013-05-30 ENCOUNTER — Ambulatory Visit (HOSPITAL_COMMUNITY): Payer: Medicare Other | Admitting: Anesthesiology

## 2013-05-30 DIAGNOSIS — L820 Inflamed seborrheic keratosis: Secondary | ICD-10-CM

## 2013-05-30 DIAGNOSIS — N61 Mastitis without abscess: Secondary | ICD-10-CM

## 2013-05-30 DIAGNOSIS — L821 Other seborrheic keratosis: Secondary | ICD-10-CM | POA: Insufficient documentation

## 2013-05-30 DIAGNOSIS — N649 Disorder of breast, unspecified: Secondary | ICD-10-CM

## 2013-05-30 HISTORY — PX: EXCISION OF ACCESSORY NIPPLE: SHX5819

## 2013-05-30 SURGERY — EXCISION, ACCESSORY NIPPLE
Anesthesia: General | Site: Breast | Laterality: Bilateral | Wound class: Clean

## 2013-05-30 MED ORDER — HYDROCODONE-ACETAMINOPHEN 5-325 MG PO TABS
1.0000 | ORAL_TABLET | Freq: Once | ORAL | Status: AC
  Administered 2013-05-30: 2 via ORAL

## 2013-05-30 MED ORDER — HYDROCODONE-ACETAMINOPHEN 5-325 MG PO TABS
ORAL_TABLET | ORAL | Status: AC
  Filled 2013-05-30: qty 2

## 2013-05-30 MED ORDER — PHENYLEPHRINE HCL 10 MG/ML IJ SOLN
INTRAMUSCULAR | Status: DC | PRN
  Administered 2013-05-30 (×2): 80 ug via INTRAVENOUS

## 2013-05-30 MED ORDER — DEXAMETHASONE SODIUM PHOSPHATE 4 MG/ML IJ SOLN
INTRAMUSCULAR | Status: DC | PRN
  Administered 2013-05-30: 8 mg via INTRAVENOUS

## 2013-05-30 MED ORDER — PROMETHAZINE HCL 25 MG/ML IJ SOLN: 6.2500 mg | INTRAMUSCULAR | Status: DC | PRN

## 2013-05-30 MED ORDER — LIDOCAINE HCL (CARDIAC) 20 MG/ML IV SOLN
INTRAVENOUS | Status: DC | PRN
  Administered 2013-05-30: 65 mg via INTRAVENOUS

## 2013-05-30 MED ORDER — 0.9 % SODIUM CHLORIDE (POUR BTL) OPTIME
TOPICAL | Status: DC | PRN
  Administered 2013-05-30: 1000 mL

## 2013-05-30 MED ORDER — OXYCODONE HCL 5 MG PO TABS: 5.0000 mg | ORAL_TABLET | Freq: Once | ORAL | Status: DC | PRN

## 2013-05-30 MED ORDER — BUPIVACAINE HCL (PF) 0.25 % IJ SOLN
INTRAMUSCULAR | Status: AC
  Filled 2013-05-30: qty 30

## 2013-05-30 MED ORDER — LACTATED RINGERS IV SOLN
INTRAVENOUS | Status: DC | PRN
  Administered 2013-05-30: 08:00:00 via INTRAVENOUS

## 2013-05-30 MED ORDER — OXYCODONE HCL 5 MG/5ML PO SOLN: 5.0000 mg | Freq: Once | ORAL | Status: DC | PRN

## 2013-05-30 MED ORDER — PROPOFOL 10 MG/ML IV BOLUS
INTRAVENOUS | Status: DC | PRN
  Administered 2013-05-30: 30 mg via INTRAVENOUS
  Administered 2013-05-30: 200 mg via INTRAVENOUS

## 2013-05-30 MED ORDER — MIDAZOLAM HCL 5 MG/5ML IJ SOLN
INTRAMUSCULAR | Status: DC | PRN
  Administered 2013-05-30: 2 mg via INTRAVENOUS

## 2013-05-30 MED ORDER — BUPIVACAINE HCL (PF) 0.25 % IJ SOLN
INTRAMUSCULAR | Status: DC | PRN
  Administered 2013-05-30: 30 mL

## 2013-05-30 MED ORDER — HYDROCODONE-ACETAMINOPHEN 5-325 MG PO TABS: 1.0000 | ORAL_TABLET | Freq: Four times a day (QID) | ORAL | Status: DC | PRN

## 2013-05-30 MED ORDER — FENTANYL CITRATE 0.05 MG/ML IJ SOLN
INTRAMUSCULAR | Status: DC | PRN
  Administered 2013-05-30 (×2): 50 ug via INTRAVENOUS

## 2013-05-30 MED ORDER — ONDANSETRON HCL 4 MG/2ML IJ SOLN
INTRAMUSCULAR | Status: DC | PRN
  Administered 2013-05-30: 4 mg via INTRAVENOUS

## 2013-05-30 MED ORDER — HYDROMORPHONE HCL PF 1 MG/ML IJ SOLN
0.2500 mg | INTRAMUSCULAR | Status: DC | PRN
  Administered 2013-05-30: 0.25 mg via INTRAVENOUS

## 2013-05-30 MED ORDER — ARTIFICIAL TEARS OP OINT
TOPICAL_OINTMENT | OPHTHALMIC | Status: DC | PRN
  Administered 2013-05-30: 1 via OPHTHALMIC

## 2013-05-30 MED ORDER — HYDROMORPHONE HCL PF 1 MG/ML IJ SOLN
INTRAMUSCULAR | Status: AC
  Filled 2013-05-30: qty 1

## 2013-05-30 SURGICAL SUPPLY — 45 items
ADH SKN CLS APL DERMABOND .7 (GAUZE/BANDAGES/DRESSINGS) ×1
BLADE SURG 11 STRL SS (BLADE) ×1 IMPLANT
BLADE SURG 15 STRL LF DISP TIS (BLADE) ×1 IMPLANT
BLADE SURG 15 STRL SS (BLADE) ×2
CANISTER SUCTION 2500CC (MISCELLANEOUS) IMPLANT
CHLORAPREP W/TINT 26ML (MISCELLANEOUS) ×3 IMPLANT
CLOTH BEACON ORANGE TIMEOUT ST (SAFETY) ×1 IMPLANT
CONT SPEC 4OZ CLIKSEAL STRL BL (MISCELLANEOUS) ×2 IMPLANT
COVER SURGICAL LIGHT HANDLE (MISCELLANEOUS) ×2 IMPLANT
DECANTER SPIKE VIAL GLASS SM (MISCELLANEOUS) ×1 IMPLANT
DERMABOND ADVANCED (GAUZE/BANDAGES/DRESSINGS) ×1
DERMABOND ADVANCED .7 DNX12 (GAUZE/BANDAGES/DRESSINGS) ×1 IMPLANT
DRAPE CHEST BREAST 15X10 FENES (DRAPES) ×2 IMPLANT
DRAPE UTILITY 15X26 W/TAPE STR (DRAPE) ×4 IMPLANT
ELECT COATED BLADE 2.86 ST (ELECTRODE) ×2 IMPLANT
ELECT REM PT RETURN 9FT ADLT (ELECTROSURGICAL) ×2
ELECTRODE REM PT RTRN 9FT ADLT (ELECTROSURGICAL) ×1 IMPLANT
GAUZE SPONGE 4X4 16PLY XRAY LF (GAUZE/BANDAGES/DRESSINGS) ×2 IMPLANT
GLOVE BIO SURGEON STRL SZ7.5 (GLOVE) ×2 IMPLANT
GLOVE BIOGEL PI IND STRL 6.5 (GLOVE) IMPLANT
GLOVE BIOGEL PI IND STRL 7.0 (GLOVE) IMPLANT
GLOVE BIOGEL PI INDICATOR 6.5 (GLOVE) ×1
GLOVE BIOGEL PI INDICATOR 7.0 (GLOVE) ×1
GLOVE SURG SS PI 7.0 STRL IVOR (GLOVE) ×1 IMPLANT
GOWN STRL NON-REIN LRG LVL3 (GOWN DISPOSABLE) ×4 IMPLANT
KIT BASIN OR (CUSTOM PROCEDURE TRAY) ×2 IMPLANT
KIT ROOM TURNOVER OR (KITS) ×2 IMPLANT
NDL HYPO 25GX1X1/2 BEV (NEEDLE) ×1 IMPLANT
NEEDLE HYPO 25GX1X1/2 BEV (NEEDLE) ×2 IMPLANT
NS IRRIG 1000ML POUR BTL (IV SOLUTION) ×2 IMPLANT
PACK SURGICAL SETUP 50X90 (CUSTOM PROCEDURE TRAY) ×2 IMPLANT
PAD ARMBOARD 7.5X6 YLW CONV (MISCELLANEOUS) ×3 IMPLANT
PENCIL BUTTON HOLSTER BLD 10FT (ELECTRODE) ×2 IMPLANT
STAPLER VISISTAT 35W (STAPLE) ×2 IMPLANT
SUT MON AB 4-0 PC3 18 (SUTURE) ×2 IMPLANT
SUT MON AB 5-0 P3 18 (SUTURE) ×1 IMPLANT
SUT SILK 2 0 FS (SUTURE) IMPLANT
SUT VIC AB 3-0 SH 27 (SUTURE) ×2
SUT VIC AB 3-0 SH 27X BRD (SUTURE) ×1 IMPLANT
SYR CONTROL 10ML LL (SYRINGE) ×2 IMPLANT
TOWEL OR 17X24 6PK STRL BLUE (TOWEL DISPOSABLE) ×2 IMPLANT
TOWEL OR 17X26 10 PK STRL BLUE (TOWEL DISPOSABLE) ×2 IMPLANT
TUBE CONNECTING 12X1/4 (SUCTIONS) IMPLANT
WATER STERILE IRR 1000ML POUR (IV SOLUTION) IMPLANT
YANKAUER SUCT BULB TIP NO VENT (SUCTIONS) IMPLANT

## 2013-05-30 NOTE — Anesthesia Postprocedure Evaluation (Signed)
  Anesthesia Post-op Note  Patient: [name]  Procedure(s) Performed: Procedure(s): BILATERAL NIPPLE BIOPSY (Bilateral)  Patient Location: PACU  Anesthesia Type:General  Level of Consciousness: awake and alert   Airway and Oxygen Therapy: Patient Spontanous Breathing  Post-op Pain: mild  Post-op Assessment: Post-op Vital signs reviewed  Post-op Vital Signs: stable  Complications: No apparent anesthesia complications

## 2013-05-30 NOTE — Transfer of Care (Signed)
Immediate Anesthesia Transfer of Care Note  Patient: [name]  Procedure(s) Performed: Procedure(s): BILATERAL NIPPLE BIOPSY (Bilateral)  Patient Location: PACU  Anesthesia Type:General  Level of Consciousness: awake, alert , oriented and patient cooperative  Airway & Oxygen Therapy: Patient Spontanous Breathing  Post-op Assessment: Report given to PACU RN, Post -op Vital signs reviewed and stable and Patient moving all extremities X 4  Post vital signs: Reviewed and stable  Complications: No apparent anesthesia complications

## 2013-05-30 NOTE — Preoperative (Signed)
Beta Blockers   Reason not to administer Beta Blockers:Not Applicable 

## 2013-05-30 NOTE — Op Note (Signed)
05/30/2013  9:06 AM  PATIENT:  [name]  78 y.o. female  PRE-OPERATIVE DIAGNOSIS:  bilateral nipple lesion  POST-OPERATIVE DIAGNOSIS:  bilateral nipple lesion  PROCEDURE:  Procedure(s): BILATERAL NIPPLE BIOPSY (Bilateral)  SURGEON:  Surgeon(s) and Role:    * Robyne Askew, MD - Primary  PHYSICIAN ASSISTANT:   ASSISTANTS: none   ANESTHESIA:   general  EBL:     BLOOD ADMINISTERED:none  DRAINS: none   LOCAL MEDICATIONS USED:  MARCAINE     SPECIMEN:  Source of Specimen:  bilateral nipple tissue  DISPOSITION OF SPECIMEN:  PATHOLOGY  COUNTS:  YES  TOURNIQUET:  * No tourniquets in log *  DICTATION: .Dragon Dictation After informed consent was obtained the patient was brought to the operating room and placed in the supine position on the operating room table. After adequate induction of general anesthesia the patient's bilateral breast area was prepped with ChloraPrep, allowed to dry, and draped in usual sterile manner. Both areas around the nipple were infiltrated with quarter percent Marcaine. Both nipples had abnormalities. The left nipple had a rash type of lesion involving the nipple. The right nipple had almost like a cutaneous horn appearing lesion. A representative portion of both abnormalities were chosen and excise sharply with an 11 blade knife.  The tissue from each side was sent to pathology for further evaluation. Hemostasis was achieved using the Bovie electrocautery. Each incision was then closed with interrupted 5-0 Monocryl subcuticular stitches. Dermabond dressings were applied. The patient tolerated the procedure well. At the end of the case a medial sponge and instrument counts were correct. The patient was then awakened and taken to recovery in stable condition.  PLAN OF CARE: Discharge to home after PACU  PATIENT DISPOSITION:  PACU - hemodynamically stable.   Delay start of Pharmacological VTE agent (>24hrs) due to surgical blood loss or risk of  bleeding: not applicable

## 2013-05-30 NOTE — Anesthesia Preprocedure Evaluation (Addendum)
Anesthesia Evaluation  Patient identified by MRN, date of birth, ID band Patient awake    Reviewed: Allergy & Precautions, H&P , NPO status , Patient's Chart, lab work & pertinent test results  History of Anesthesia Complications Negative for: history of anesthetic complications  Airway Mallampati: II      Dental  (+) Teeth Intact   Pulmonary pneumonia -, resolved, COPD breath sounds clear to auscultation        Cardiovascular hypertension, + dysrhythmias Rhythm:Irregular Rate:Normal     Neuro/Psych  Neuromuscular disease    GI/Hepatic hiatal hernia, GERD-  ,  Endo/Other    Renal/GU      Musculoskeletal   Abdominal (+) + obese,   Peds  Hematology   Anesthesia Other Findings   Reproductive/Obstetrics                          Anesthesia Physical Anesthesia Plan  ASA: III  Anesthesia Plan: General   Post-op Pain Management:    Induction: Intravenous  Airway Management Planned: LMA  Additional Equipment:   Intra-op Plan:   Post-operative Plan:   Informed Consent: I have reviewed the patients History and Physical, chart, labs and discussed the procedure including the risks, benefits and alternatives for the proposed anesthesia with the patient or authorized representative who has indicated his/her understanding and acceptance.   Dental advisory given  Plan Discussed with: CRNA and Surgeon  Anesthesia Plan Comments:         Anesthesia Quick Evaluation

## 2013-05-30 NOTE — H&P (View-Only) (Signed)
Patient ID: [name], female   DOB: Dec 03, 1934, 78 y.o.   MRN: [id number]  Chief Complaint  Patient presents with  . New Evaluation    est patient new prob, lt br nipple draining    HPI [name] is a 78 y.o. female.  The patient is a 78 year old white female who is 6 years status post left breast lumpectomy for breast cancer an 11 year status post right colectomy. Over the last couple her she has noticed some clear yellowish discharge from her left nipple. Over the last few months she has been noticing that occurring every day. She denies any bloody nipple discharge. She denies any pain in the breast. She does have some soreness associated with the nipple and some itching as well.  HPI  Past Medical History  Diagnosis Date  . Atrial fibrillation   . Cancer     right colon and left breast  . Breast cancer 01/06/2012  . Colon cancer 01/06/2012  . Hernia   . GERD (gastroesophageal reflux disease)   . Hypertension   . Allergy     rhinitis  . COPD (chronic obstructive pulmonary disease)   . Osteopenia   . Diverticulosis   . Hiatal hernia   . Hyperlipidemia     Past Surgical History  Procedure Laterality Date  . Colectomy      right side  . Mastectomy partial / lumpectomy  2008    left breast  . Cardiac catheterization      Family History  Problem Relation Age of Onset  . Heart disease Mother   . Cancer Sister     stomach and colon  . Cancer Brother     lung  . Heart disease Brother 41    Social History History  Substance Use Topics  . Smoking status: Never Smoker   . Smokeless tobacco: Never Used  . Alcohol Use: No    Allergies  Allergen Reactions  . Contrast Media [Iodinated Diagnostic Agents] Other (See Comments)    Per pt strong burning sensation starting in chest radiating outward  . Celebrex [Celecoxib]   . Statins   . Tape Itching and Rash    Where applied and will spread    Current Outpatient Prescriptions  Medication Sig Dispense Refill  .  diltiazem (DILACOR XR) 180 MG 24 hr capsule Take 1 capsule (180 mg total) by mouth daily.  90 capsule  3  . furosemide (LASIX) 20 MG tablet Take 1 tablet (20 mg total) by mouth daily.  90 tablet  1  . isosorbide mononitrate (ISMO,MONOKET) 20 MG tablet Take 20 mg by mouth 2 (two) times daily.      Marland Kitchen losartan (COZAAR) 100 MG tablet Take 100 mg by mouth daily.      . nitroGLYCERIN (NITROSTAT) 0.4 MG SL tablet Place 1 tablet (0.4 mg total) under the tongue every 5 (five) minutes as needed for chest pain.  50 tablet  3  . Olopatadine HCl 0.2 % SOLN Apply 1 drop to eye every morning.  2.5 mL  11  . omeprazole (PRILOSEC) 10 MG capsule Take 10 mg by mouth daily.      . simvastatin (ZOCOR) 10 MG tablet Take 10 mg by mouth at bedtime.      . valsartan-hydrochlorothiazide (DIOVAN-HCT) 80-12.5 MG per tablet Take 1 tablet by mouth daily.      Marland Kitchen warfarin (COUMADIN) 2.5 MG tablet Take 2.5 mg by mouth daily.      . cetirizine (ZYRTEC) 10  MG tablet Take 10 mg by mouth daily.      . Cholecalciferol (VITAMIN D) 2000 UNITS CAPS Take 1 capsule by mouth daily.       No current facility-administered medications for this visit.    Review of Systems Review of Systems  Constitutional: Negative.   HENT: Negative.   Eyes: Negative.   Respiratory: Negative.   Cardiovascular: Negative.   Gastrointestinal: Negative.   Endocrine: Negative.   Genitourinary: Negative.   Musculoskeletal: Negative.   Skin: Negative.   Allergic/Immunologic: Negative.   Neurological: Negative.   Hematological: Negative.   Psychiatric/Behavioral: Negative.     Blood pressure 140/72, pulse 64, resp. rate 14, height 5\' 7"  (1.702 m), weight 234 lb 6.4 oz (106.323 kg).  Physical Exam Physical Exam  Constitutional: She is oriented to person, place, and time. She appears well-developed and well-nourished.  HENT:  Head: Normocephalic and atraumatic.  Eyes: Conjunctivae and EOM are normal. Pupils are equal, round, and reactive to light.   Neck: Normal range of motion. Neck supple.  Cardiovascular: Normal rate, regular rhythm and normal heart sounds.   Pulmonary/Chest: Effort normal and breath sounds normal.  There is no palpable mass in either breast. There is no palpable axillary, supraclavicular, or cervical lymphadenopathy. She does have some redness and irritation and rashes associated with the left nipple. There is no single duct from which we can express any fluid. On the right nipple she appears to have something that looks like a cutaneous horn measuring just a couple millimeters across  Abdominal: Soft. Bowel sounds are normal. She exhibits no mass. There is no tenderness.  Musculoskeletal: Normal range of motion.  Lymphadenopathy:    She has no cervical adenopathy.  Neurological: She is alert and oriented to person, place, and time.  Skin: Skin is warm and dry.  Psychiatric: She has a normal mood and affect. Her behavior is normal.    Data Reviewed As above  Assessment    The patient is 6 years status post left breast lumpectomy for breast cancer and now has a new rash involving the nipple. This certainly does raise concern for possible Paget's disease. Given the abnormality of both nipples I think it would be reasonable to do bilateral nipple biopsies to rule out more significant disease. I have discussed with her in detail the risks and benefits of the operation to do this as well as some of the technical aspects and she understands and wishes to proceed     Plan    Plan for bilateral nipple biopsy once we have cardiac clearance        TOTH III,Loletha Bertini S 05/21/2013, 8:21 AM

## 2013-05-30 NOTE — Interval H&P Note (Signed)
History and Physical Interval Note:  05/30/2013 8:23 AM  [name]  has presented today for surgery, with the diagnosis of bilateral nipple lesion  The various methods of treatment have been discussed with the patient and family. After consideration of risks, benefits and other options for treatment, the patient has consented to  Procedure(s): BILATERAL NIPPLE BIOPSY (N/A) as a surgical intervention .  The patient's history has been reviewed, patient examined, no change in status, stable for surgery.  I have reviewed the patient's chart and labs.  Questions were answered to the patient's satisfaction.     TOTH III,PAUL S

## 2013-05-31 ENCOUNTER — Encounter (HOSPITAL_COMMUNITY): Payer: Self-pay | Admitting: General Surgery

## 2013-06-01 ENCOUNTER — Telehealth (INDEPENDENT_AMBULATORY_CARE_PROVIDER_SITE_OTHER): Payer: Self-pay

## 2013-06-01 NOTE — Telephone Encounter (Signed)
Message copied by Brennan Bailey on Fri Jun 01, 2013  3:02 PM ------      Message from: [name]      Created: Fri Jun 01, 2013  6:56 AM       All looks like inflammation. No cancer. She can try putting a steroid cream on it and return to medical doc ------

## 2013-06-01 NOTE — Telephone Encounter (Signed)
LMOM> please relay path results below to pt.

## 2013-06-07 ENCOUNTER — Telehealth: Payer: Self-pay | Admitting: Physician Assistant

## 2013-06-07 MED ORDER — DILTIAZEM HCL ER 180 MG PO CP24: 180.0000 mg | ORAL_CAPSULE | Freq: Every day | ORAL | Status: DC

## 2013-06-07 NOTE — Telephone Encounter (Signed)
Medication refilled per protocol.  Medication is one in the same

## 2013-06-07 NOTE — Telephone Encounter (Signed)
Cardizem 180 mg 1 QD #90  Pt said that the last time we ordered Diltiazem generic for Dilacor, but she said that she needs the diltiazem generic for cardizem.

## 2013-06-20 ENCOUNTER — Telehealth: Payer: Self-pay | Admitting: Family Medicine

## 2013-06-20 MED ORDER — FUROSEMIDE 20 MG PO TABS: 20.0000 mg | ORAL_TABLET | Freq: Every day | ORAL | Status: DC

## 2013-06-20 NOTE — Telephone Encounter (Signed)
Furosemide 20 mg QD

## 2013-06-21 ENCOUNTER — Ambulatory Visit (INDEPENDENT_AMBULATORY_CARE_PROVIDER_SITE_OTHER): Payer: Medicare Other | Admitting: General Surgery

## 2013-06-21 ENCOUNTER — Encounter (INDEPENDENT_AMBULATORY_CARE_PROVIDER_SITE_OTHER): Payer: Self-pay | Admitting: General Surgery

## 2013-06-21 VITALS — BP 148/92 | HR 78 | Resp 18 | Ht 67.0 in | Wt 239.0 lb

## 2013-06-21 DIAGNOSIS — N6489 Other specified disorders of breast: Secondary | ICD-10-CM

## 2013-06-21 DIAGNOSIS — N649 Disorder of breast, unspecified: Secondary | ICD-10-CM

## 2013-06-21 NOTE — Patient Instructions (Signed)
Fruge use steroid cream if irritation continues

## 2013-06-21 NOTE — Progress Notes (Signed)
Subjective:     Patient ID: [name], female   DOB: 1935-03-22, 78 y.o.   MRN: [id number]  HPI The patient is a 78 Year old white female who is a couple weeks status post bilateral nipple biopsies. Her pathology showed these areas to be chronic irritation but there was no evidence of malignancy. She actually feels better and has no complaints.  Review of Systems     Objective:   Physical Exam On exam both of her nipple incisions are healing nicely with no sign of infection. The irritation is actually resolving.    Assessment:     The patient is 2 weeks status post bilateral nipple biopsies for benign disease     Plan:     At this point we will plan to follow her up for her normal yearly check. She agrees to continue to do regular self exams.

## 2013-06-22 ENCOUNTER — Encounter (INDEPENDENT_AMBULATORY_CARE_PROVIDER_SITE_OTHER): Payer: Medicare Other | Admitting: General Surgery

## 2013-06-27 ENCOUNTER — Encounter: Payer: Self-pay | Admitting: Physician Assistant

## 2013-06-27 ENCOUNTER — Ambulatory Visit (INDEPENDENT_AMBULATORY_CARE_PROVIDER_SITE_OTHER): Payer: BC Managed Care – HMO | Admitting: Physician Assistant

## 2013-06-27 VITALS — BP 142/70 | HR 64 | Temp 98.1°F | Resp 18 | Wt 236.0 lb

## 2013-06-27 DIAGNOSIS — Z7901 Long term (current) use of anticoagulants: Secondary | ICD-10-CM

## 2013-06-27 DIAGNOSIS — I4891 Unspecified atrial fibrillation: Secondary | ICD-10-CM

## 2013-06-27 LAB — PT WITH INR/FINGERSTICK: PT, fingerstick: 27.4 seconds — ABNORMAL HIGH (ref 10.4–12.5)

## 2013-06-27 NOTE — Progress Notes (Signed)
Patient ID: Lurlie Wigen Siek MRN: [id number], DOB: Storlie 04, 1936, 78 y.o. Date of Encounter: 06/27/2013, 10:46 AM    Chief Complaint:  Chief Complaint  Patient presents with  . PT/INR     HPI: 78 y.o. year old female. here to check her INR. She is taking her Coumadin as directed. Takes 2.5 mg on T. he states Thursdays and Sundays. Takes 3 mg all other days. She has had no missed or skipped doses. Had no significant change in her diet regarding intake of vitamin K foods. Has had no bleeding.       Home Meds: See attached medication section for any medications that were entered at today's visit. The computer does not put those onto this list.The following list is a list of meds entered prior to today's visit.   Current Outpatient Prescriptions on File Prior to Visit  Medication Sig Dispense Refill  . Cholecalciferol (VITAMIN D) 2000 UNITS CAPS Take 1 capsule by mouth daily.      . furosemide (LASIX) 20 MG tablet Take 1 tablet (20 mg total) by mouth daily.  90 tablet  1  . HYDROcodone-acetaminophen (NORCO) 5-325 MG per tablet Take 1-2 tablets by mouth every 6 (six) hours as needed for pain.  30 tablet  1  . isosorbide dinitrate (ISORDIL) 40 MG tablet Take 40 mg by mouth daily.      Marland Kitchen losartan (COZAAR) 100 MG tablet Take 100 mg by mouth daily.      . nitroGLYCERIN (NITROSTAT) 0.4 MG SL tablet Place 1 tablet (0.4 mg total) under the tongue every 5 (five) minutes as needed for chest pain.  50 tablet  3  . omeprazole (PRILOSEC) 20 MG capsule Take 20 mg by mouth daily as needed (heartburn, acid reflux).      . simvastatin (ZOCOR) 10 MG tablet Take 10 mg by mouth at bedtime.      . triamcinolone (KENALOG) 0.025 % cream Apply topically as needed.      . warfarin (COUMADIN) 2.5 MG tablet Take 2.5-3 mg by mouth See admin instructions. Takes 2.5 mg on tues, thurs, and sundays. Takes 3 mg all other days       No current facility-administered medications on file prior to visit.    Allergies:   Allergies  Allergen Reactions  . Contrast Media [Iodinated Diagnostic Agents] Other (See Comments)    Per pt strong burning sensation starting in chest radiating outward  . Celebrex [Celecoxib]   . Statins   . Tape Itching and Rash    Red Where applied and will spread      Review of Systems: See HPI for pertinent ROS. All other ROS negative.    Physical Exam: Blood pressure 142/70, pulse 64, temperature 98.1 F (36.7 C), temperature source Oral, resp. rate 18, weight 236 lb (107.049 kg)., Body mass index is 36.95 kg/(m^2). General: An obese white female. Appears in no acute distress. Lungs: Clear bilaterally to auscultation without wheezes, rales, or rhonchi. Breathing is unlabored. Heart: Irregular rhythm. Msk:  Strength and tone normal for age. Extremities/Skin: Warm and dry. No clubbing or cyanosis. No edema. No rashes or suspicious lesions. Neuro: Alert and oriented X 3. Moves all extremities spontaneously. Gait is normal. CNII-XII grossly in tact. Psych:  Responds to questions appropriately with a normal affect.   Results for orders placed in visit on 06/27/13  PT WITH INR/FINGERSTICK      Result Value Range   PT, fingerstick 27.4 (*) 10.4 - 12.5 seconds   INR,  fingerstick 2.3 (*) 0.80 - 1.20     ASSESSMENT AND PLAN:  78 y.o. year old female with  1. Long term (current) use of anticoagulants - PT with INR/Fingerstick  2. Atrial fibrillation  Continued current Coumadin dosing. 2.5 mg on Tuesdays and Thursdays and Sundays. 3 mg all other days. The clinic to recheck PT/INR in 4 weeks. Sooner if needed.   Signed, [address] [place], [place], [facility] 06/27/2013 10:46 AM

## 2013-07-03 ENCOUNTER — Encounter: Payer: Self-pay | Admitting: Cardiology

## 2013-07-03 DIAGNOSIS — I2510 Atherosclerotic heart disease of native coronary artery without angina pectoris: Secondary | ICD-10-CM | POA: Insufficient documentation

## 2013-07-04 ENCOUNTER — Ambulatory Visit (INDEPENDENT_AMBULATORY_CARE_PROVIDER_SITE_OTHER): Payer: Medicare Other | Admitting: Cardiology

## 2013-07-04 ENCOUNTER — Encounter: Payer: Self-pay | Admitting: Cardiology

## 2013-07-04 VITALS — BP 142/70 | HR 88 | Ht 67.0 in | Wt 237.4 lb

## 2013-07-04 DIAGNOSIS — E785 Hyperlipidemia, unspecified: Secondary | ICD-10-CM

## 2013-07-04 DIAGNOSIS — I2510 Atherosclerotic heart disease of native coronary artery without angina pectoris: Secondary | ICD-10-CM

## 2013-07-04 DIAGNOSIS — I482 Chronic atrial fibrillation, unspecified: Secondary | ICD-10-CM

## 2013-07-04 DIAGNOSIS — Z7901 Long term (current) use of anticoagulants: Secondary | ICD-10-CM | POA: Insufficient documentation

## 2013-07-04 DIAGNOSIS — I4821 Permanent atrial fibrillation: Secondary | ICD-10-CM | POA: Insufficient documentation

## 2013-07-04 DIAGNOSIS — I10 Essential (primary) hypertension: Secondary | ICD-10-CM

## 2013-07-04 DIAGNOSIS — I4891 Unspecified atrial fibrillation: Secondary | ICD-10-CM

## 2013-07-04 NOTE — Progress Notes (Signed)
[address] [place], [place]  [address] Phone: [phone] Fax:  [phone]  Date:  07/04/2013   ID:  [name], DOB [birth year], MRN [id number]  PCP:  [name], PA-C  Cardiologist:  Armanda Magic, MD   History of Present Illness: [name] is a 78 y.o. female with a history of CAD, HTN, dyslipidemia and PAF.  She is doing well.  She denies any dizziness, palpitations or syncope.  She has chronic SOB which she thinks has gotten worse since they changed her cardizem to a different company.  She also has had some chest tightness which occurs with exertion and resolves with rest.  She has chronic LE edema which is stable.   Wt Readings from Last 3 Encounters:  06/27/13 236 lb (107.049 kg)  06/21/13 239 lb (108.41 kg)  05/28/13 235 lb 14.3 oz (107 kg)     Past Medical History  Diagnosis Date  . Cancer     right colon and left breast  . Breast cancer 01/06/2012  . Colon cancer 01/06/2012  . Hernia   . GERD (gastroesophageal reflux disease)   . Hypertension   . Allergy     rhinitis  . COPD (chronic obstructive pulmonary disease)   . Osteopenia   . Diverticulosis   . Pneumonia     hx child  . Hiatal hernia     denies  . Arthritis   . Coronary artery disease 2006    nonobstructive with 20-30% ostial LAD and LM  . Atrial fibrillation     chronic atrial fibrillation  . Edema extremities   . Hyperlipidemia     Current Outpatient Prescriptions  Medication Sig Dispense Refill  . Cholecalciferol (VITAMIN D) 2000 UNITS CAPS Take 1 capsule by mouth daily.      Marland Kitchen diltiazem (CARDIZEM CD) 180 MG 24 hr capsule Take 180 mg by mouth daily.      . furosemide (LASIX) 20 MG tablet Take 1 tablet (20 mg total) by mouth daily.  90 tablet  1  . HYDROcodone-acetaminophen (NORCO) 5-325 MG per tablet Take 1-2 tablets by mouth every 6 (six) hours as needed for pain.  30 tablet  1  . isosorbide dinitrate (ISORDIL) 40 MG tablet Take 40 mg by mouth daily.      Marland Kitchen losartan (COZAAR)  100 MG tablet Take 100 mg by mouth daily.      . nitroGLYCERIN (NITROSTAT) 0.4 MG SL tablet Place 1 tablet (0.4 mg total) under the tongue every 5 (five) minutes as needed for chest pain.  50 tablet  3  . omeprazole (PRILOSEC) 20 MG capsule Take 20 mg by mouth daily as needed (heartburn, acid reflux).      . simvastatin (ZOCOR) 10 MG tablet Take 10 mg by mouth at bedtime.      . triamcinolone (KENALOG) 0.025 % cream Apply topically as needed.      . warfarin (COUMADIN) 2.5 MG tablet Take 2.5-3 mg by mouth See admin instructions. Takes 2.5 mg on tues, thurs, and sundays. Takes 3 mg all other days       No current facility-administered medications for this visit.    Allergies:    Allergies  Allergen Reactions  . Contrast Media [Iodinated Diagnostic Agents] Other (See Comments)    Per pt strong burning sensation starting in chest radiating outward  . Celebrex [Celecoxib]   . Statins   . Tape Itching and Rash    Red Where applied and will spread  Social History:  The patient  reports that she has never smoked. She has never used smokeless tobacco. She reports that she does not drink alcohol or use illicit drugs.   Family History:  The patient's family history includes Cancer in her sister and sister; Heart disease in her mother; Heart disease (age of onset: 14) in her brother.   ROS:  Please see the history of present illness.      All other systems reviewed and negative.   PHYSICAL EXAM: VS:  There were no vitals taken for this visit. Well nourished, well developed, in no acute distress HEENT: normal Neck: no JVD Cardiac:  normal S1, S2; RRR; no murmur Lungs:  clear to auscultation bilaterally, no wheezing, rhonchi or rales Abd: soft, nontender, no hepatomegaly Ext: no edema Skin: warm and dry Neuro:  CNs 2-12 intact, no focal abnormalities noted      ASSESSMENT AND PLAN:  1. ASCAD with some exertional CP which has only occurred since her Diltiazem changed brands.  She has no  angina before that.  After talking with her pharmacy she has not been compliant with her meds and has only been taking her Isosorbide once daily and has not been taking her Cardizem.  She just had nuclear stress test that showed no ischemia a few months ago.  - Increase  Isosorbide to 40mg  BID and continue ASA 2. HTN  - continue Cardizem/Cozaar 3. Dyslipidemia  - continue simvastatin 4. Chronic atrial fibrillation  - continue warfarin 5. Chronic anticoagulation  Followup with PA in 2 weeks and with me in 6 months  Signed, Armanda Magic, MD 07/04/2013 10:12 AM

## 2013-07-04 NOTE — Patient Instructions (Signed)
Start your Isosorbide Din 40 MG Twice a day   Your physician recommends that you schedule a follow-up appointment in: with a PA in two weeks  Your physician wants you to follow-up in: 6 months with Dr. Sherlyn Lick will receive a reminder letter in the mail two months in advance. If you don't receive a letter, please call our office to schedule the follow-up appointment.

## 2013-07-05 ENCOUNTER — Encounter: Payer: Self-pay | Admitting: Cardiology

## 2013-07-25 ENCOUNTER — Ambulatory Visit (INDEPENDENT_AMBULATORY_CARE_PROVIDER_SITE_OTHER): Payer: BC Managed Care – HMO | Admitting: Family Medicine

## 2013-07-25 DIAGNOSIS — Z23 Encounter for immunization: Secondary | ICD-10-CM

## 2013-07-27 ENCOUNTER — Ambulatory Visit: Payer: Medicare Other | Admitting: Nurse Practitioner

## 2013-08-08 ENCOUNTER — Other Ambulatory Visit: Payer: Self-pay | Admitting: Emergency Medicine

## 2013-08-09 ENCOUNTER — Ambulatory Visit (HOSPITAL_BASED_OUTPATIENT_CLINIC_OR_DEPARTMENT_OTHER): Payer: Medicare Other | Admitting: Oncology

## 2013-08-09 ENCOUNTER — Other Ambulatory Visit (HOSPITAL_BASED_OUTPATIENT_CLINIC_OR_DEPARTMENT_OTHER): Payer: Medicare Other | Admitting: Lab

## 2013-08-09 ENCOUNTER — Encounter (INDEPENDENT_AMBULATORY_CARE_PROVIDER_SITE_OTHER): Payer: Self-pay

## 2013-08-09 ENCOUNTER — Telehealth: Payer: Self-pay | Admitting: *Deleted

## 2013-08-09 ENCOUNTER — Encounter: Payer: Self-pay | Admitting: Oncology

## 2013-08-09 VITALS — BP 146/63 | HR 85 | Temp 98.2°F | Resp 18 | Ht 67.0 in | Wt 241.3 lb

## 2013-08-09 DIAGNOSIS — Z853 Personal history of malignant neoplasm of breast: Secondary | ICD-10-CM

## 2013-08-09 DIAGNOSIS — C50919 Malignant neoplasm of unspecified site of unspecified female breast: Secondary | ICD-10-CM

## 2013-08-09 DIAGNOSIS — Z85038 Personal history of other malignant neoplasm of large intestine: Secondary | ICD-10-CM

## 2013-08-09 LAB — CBC WITH DIFFERENTIAL/PLATELET
EOS%: 1.3 % (ref 0.0–7.0)
Eosinophils Absolute: 0.1 10*3/uL (ref 0.0–0.5)
LYMPH%: 36.1 % (ref 14.0–49.7)
MCV: 93.9 fL (ref 79.5–101.0)
MONO#: 0.7 10*3/uL (ref 0.1–0.9)
MONO%: 10.7 % (ref 0.0–14.0)
NEUT#: 3.3 10*3/uL (ref 1.5–6.5)
RBC: 3.93 10*6/uL (ref 3.70–5.45)
RDW: 13.8 % (ref 11.2–14.5)
WBC: 6.4 10*3/uL (ref 3.9–10.3)

## 2013-08-09 LAB — COMPREHENSIVE METABOLIC PANEL (CC13)
ALT: 11 U/L (ref 0–55)
Albumin: 3.6 g/dL (ref 3.5–5.0)
Alkaline Phosphatase: 89 U/L (ref 40–150)
Anion Gap: 11 mEq/L (ref 3–11)
CO2: 27 mEq/L (ref 22–29)
Glucose: 177 mg/dl — ABNORMAL HIGH (ref 70–140)
Potassium: 4.4 mEq/L (ref 3.5–5.1)
Sodium: 141 mEq/L (ref 136–145)
Total Protein: 7.5 g/dL (ref 6.4–8.3)

## 2013-08-09 MED ORDER — TRIAMCINOLONE ACETONIDE 0.025 % EX OINT: 1.0000 "application " | TOPICAL_OINTMENT | Freq: Two times a day (BID) | CUTANEOUS | Status: DC

## 2013-08-09 NOTE — Telephone Encounter (Signed)
appts made and printed...td 

## 2013-08-09 NOTE — Progress Notes (Signed)
OFFICE PROGRESS NOTE  CC  Susan Richards, PA-C [address] [place] [address] Dr. Chevis Pretty  DIAGNOSIS: 78 year old female with  #1History of stage II colon carcinoma originally diagnosed September 2003.  #2 history of invasive ductal carcinoma of the left breast diagnosed March 2008 status post lumpectomy with sentinel node dissection for a stage I ER positive PR positive HER-2/neu negative breast cancer.  PRIOR THERAPY:  #1 patient with history of stage II colon carcinoma on observation only without evidence of recurrent disease patient is now 10 years out since diagnosis.  #2 stage I invasive ductal carcinoma of the left breast. Patient has received tamoxifen and Aromasin in the past she has completed a total of 4 years of therapy. Her therapy was discontinued due to nipple discharge and patient declined any further treatments for her breast cancer.  CURRENT THERAPY:Observation  INTERVAL HISTORY: [name] 78 y.o. female returns for Followup visit today.she denies any fevers chills headaches no diarrhea or constipation no nausea or vomiting no bleeding she has not noticed any masses in her right or left breast. Remainder of the 10 point review of systems is negative.  MEDICAL HISTORY: Past Medical History  Diagnosis Date  . Cancer     right colon and left breast  . Breast cancer 01/06/2012  . Colon cancer 01/06/2012  . Hernia   . GERD (gastroesophageal reflux disease)   . Hypertension   . Allergy     rhinitis  . COPD (chronic obstructive pulmonary disease)   . Osteopenia   . Diverticulosis   . Pneumonia     hx child  . Hiatal hernia     denies  . Arthritis   . Coronary artery disease 2006    nonobstructive with 20-30% ostial LAD and LM  . Atrial fibrillation     chronic atrial fibrillation  . Edema extremities   . Hyperlipidemia     ALLERGIES:  is allergic to contrast media; celebrex; statins; and tape.  MEDICATIONS:  Current Outpatient  Prescriptions  Medication Sig Dispense Refill  . Cholecalciferol (VITAMIN D) 2000 UNITS CAPS Take 1 capsule by mouth daily.      Marland Kitchen diltiazem (CARDIZEM CD) 180 MG 24 hr capsule Take 180 mg by mouth daily.      . furosemide (LASIX) 20 MG tablet Take 1 tablet (20 mg total) by mouth daily.  90 tablet  1  . isosorbide dinitrate (ISORDIL) 40 MG tablet Take 40 mg by mouth 2 (two) times daily.       Marland Kitchen losartan (COZAAR) 100 MG tablet Take 100 mg by mouth daily.      Marland Kitchen omeprazole (PRILOSEC) 20 MG capsule Take 20 mg by mouth daily as needed (heartburn, acid reflux).      . simvastatin (ZOCOR) 10 MG tablet Take 10 mg by mouth at bedtime.      Marland Kitchen warfarin (COUMADIN) 2.5 MG tablet Take 2.5-3 mg by mouth See admin instructions. Takes 2.5 mg on tues, thurs, and sundays. Takes 3 mg all other days      . HYDROcodone-acetaminophen (NORCO) 5-325 MG per tablet Take 1-2 tablets by mouth every 6 (six) hours as needed for pain.  30 tablet  1  . nitroGLYCERIN (NITROSTAT) 0.4 MG SL tablet Place 1 tablet (0.4 mg total) under the tongue every 5 (five) minutes as needed for chest pain.  50 tablet  3   No current facility-administered medications for this visit.    SURGICAL HISTORY:  Past Surgical  History  Procedure Laterality Date  . Colectomy      right side  . Mastectomy partial / lumpectomy  2008    left breast  . Cardiac catheterization    . Breast surgery      lumpectomy left  . Eye surgery Bilateral 12    cataracts  . Appendectomy    . Cholecystectomy    . Excision of accessory nipple Bilateral 05/30/2013    Procedure: BILATERAL NIPPLE BIOPSY;  Surgeon: Robyne Askew, MD;  Location: [facility] OR;  Service: General;  Laterality: Bilateral;    REVIEW OF SYSTEMS:  Pertinent items are noted in HPI.   PHYSICAL EXAMINATION: General appearance: alert, cooperative and appears stated age Lymph nodes: Cervical, supraclavicular, and axillary nodes normal. Resp: clear to auscultation bilaterally and normal percussion  bilaterally Back: symmetric, no curvature. ROM normal. No CVA tenderness. Cardio: regular rate and rhythm, S1, S2 normal, no murmur, click, rub or gallop GI: soft, non-tender; bowel sounds normal; no masses,  no organomegaly Extremities: extremities normal, atraumatic, no cyanosis or edema Neurologic: Grossly normal Bilateral breast examination left breast reveals well-healed surgical scar no nodularity no evidence of local recurrence no nipple discharge or masses. Right breast no masses or nipple discharge. ECOG PERFORMANCE STATUS: 1 - Symptomatic but completely ambulatory  Blood pressure 146/63, pulse 85, temperature 98.2 F (36.8 C), temperature source Oral, resp. rate 18, height 5\' 7"  (1.702 m), weight 241 lb 4.8 oz (109.453 kg).  LABORATORY DATA: Lab Results  Component Value Date   WBC 6.4 08/09/2013   HGB 11.7 08/09/2013   HCT 36.9 08/09/2013   MCV 93.9 08/09/2013   PLT 211 08/09/2013      Chemistry      Component Value Date/Time   NA 141 08/09/2013 0851   NA 134* 05/28/2013 1322   NA 140 04/09/2010 1247   K 4.4 08/09/2013 0851   K 5.0 05/28/2013 1322   K 4.1 04/09/2010 1247   CL 99 05/28/2013 1322   CL 102 02/09/2013 0947   CL 100 04/09/2010 1247   CO2 27 08/09/2013 0851   CO2 26 05/28/2013 1322   CO2 26 04/09/2010 1247   BUN 12.5 08/09/2013 0851   BUN 14 05/28/2013 1322   BUN 11 04/09/2010 1247   CREATININE 0.8 08/09/2013 0851   CREATININE 0.70 05/28/2013 1322   CREATININE 0.69 02/26/2013 1009      Component Value Date/Time   CALCIUM 9.8 08/09/2013 0851   CALCIUM 10.0 05/28/2013 1322   CALCIUM 9.3 04/09/2010 1247   ALKPHOS 89 08/09/2013 0851   ALKPHOS 83 02/26/2013 1009   ALKPHOS 41 04/09/2010 1247   AST 16 08/09/2013 0851   AST 17 02/26/2013 1009   AST 22 04/09/2010 1247   ALT 11 08/09/2013 0851   ALT 14 02/26/2013 1009   ALT 17 04/09/2010 1247   BILITOT 0.57 08/09/2013 0851   BILITOT 0.5 02/26/2013 1009   BILITOT 0.40 04/09/2010 1247       RADIOGRAPHIC STUDIES:  No results  found.  ASSESSMENT: 78 year old female with  #1 history of stage II colon cancer back in 2003. Patient is without evidence of local recurrence.  #2 stage I invasive ductal carcinoma of the left breast status post lumpectomy followed by radiation and then adjuvant tamoxifen followed by Aromasin for a total of 4 years of therapy. Patient declined therapy thereafter. She has no evidence of local recurrence.  PLAN:   #1 patient will continue to be followed by me on a every  12 month basis    #2 she knows to call with any problems questions or concerns.   All questions were answered. The patient knows to call the clinic with any problems, questions or concerns. We can certainly see the patient much sooner if necessary.  I spent 20 minutes counseling the patient face to face. The total time spent in the appointment was 30 minutes.    Drue Second, MD Medical/Oncology [facility] [phone] (beeper) [phone] (Office)

## 2013-09-04 ENCOUNTER — Encounter (HOSPITAL_COMMUNITY): Payer: Self-pay | Admitting: Emergency Medicine

## 2013-09-04 ENCOUNTER — Encounter: Payer: Self-pay | Admitting: Family Medicine

## 2013-09-04 ENCOUNTER — Emergency Department (HOSPITAL_COMMUNITY)
Admission: EM | Admit: 2013-09-04 | Discharge: 2013-09-04 | Disposition: A | Discharge status: Home / Self Care | Payer: Medicare Other | Attending: Emergency Medicine | Admitting: Emergency Medicine

## 2013-09-04 ENCOUNTER — Ambulatory Visit (INDEPENDENT_AMBULATORY_CARE_PROVIDER_SITE_OTHER): Payer: BC Managed Care – HMO | Admitting: Family Medicine

## 2013-09-04 ENCOUNTER — Emergency Department (HOSPITAL_COMMUNITY): Payer: Medicare Other

## 2013-09-04 VITALS — BP 130/60 | HR 80 | Temp 98.2°F | Resp 18 | Wt 241.0 lb

## 2013-09-04 DIAGNOSIS — M899 Disorder of bone, unspecified: Secondary | ICD-10-CM | POA: Insufficient documentation

## 2013-09-04 DIAGNOSIS — R079 Chest pain, unspecified: Secondary | ICD-10-CM

## 2013-09-04 DIAGNOSIS — R0602 Shortness of breath: Secondary | ICD-10-CM

## 2013-09-04 DIAGNOSIS — I4891 Unspecified atrial fibrillation: Secondary | ICD-10-CM | POA: Insufficient documentation

## 2013-09-04 DIAGNOSIS — Z95818 Presence of other cardiac implants and grafts: Secondary | ICD-10-CM | POA: Insufficient documentation

## 2013-09-04 DIAGNOSIS — Z853 Personal history of malignant neoplasm of breast: Secondary | ICD-10-CM | POA: Insufficient documentation

## 2013-09-04 DIAGNOSIS — Z79899 Other long term (current) drug therapy: Secondary | ICD-10-CM | POA: Insufficient documentation

## 2013-09-04 DIAGNOSIS — K219 Gastro-esophageal reflux disease without esophagitis: Secondary | ICD-10-CM | POA: Insufficient documentation

## 2013-09-04 DIAGNOSIS — I10 Essential (primary) hypertension: Secondary | ICD-10-CM | POA: Insufficient documentation

## 2013-09-04 DIAGNOSIS — Z7901 Long term (current) use of anticoagulants: Secondary | ICD-10-CM | POA: Insufficient documentation

## 2013-09-04 DIAGNOSIS — I200 Unstable angina: Secondary | ICD-10-CM

## 2013-09-04 DIAGNOSIS — J441 Chronic obstructive pulmonary disease with (acute) exacerbation: Secondary | ICD-10-CM | POA: Insufficient documentation

## 2013-09-04 DIAGNOSIS — E785 Hyperlipidemia, unspecified: Secondary | ICD-10-CM | POA: Insufficient documentation

## 2013-09-04 DIAGNOSIS — Z8701 Personal history of pneumonia (recurrent): Secondary | ICD-10-CM | POA: Insufficient documentation

## 2013-09-04 DIAGNOSIS — IMO0002 Reserved for concepts with insufficient information to code with codable children: Secondary | ICD-10-CM | POA: Insufficient documentation

## 2013-09-04 DIAGNOSIS — M129 Arthropathy, unspecified: Secondary | ICD-10-CM | POA: Insufficient documentation

## 2013-09-04 DIAGNOSIS — Z85038 Personal history of other malignant neoplasm of large intestine: Secondary | ICD-10-CM | POA: Insufficient documentation

## 2013-09-04 DIAGNOSIS — I2510 Atherosclerotic heart disease of native coronary artery without angina pectoris: Secondary | ICD-10-CM | POA: Insufficient documentation

## 2013-09-04 LAB — CBC
HCT: 40.4 % (ref 36.0–46.0)
Hemoglobin: 13.2 g/dL (ref 12.0–15.0)
MCH: 30.8 pg (ref 26.0–34.0)
MCHC: 32.7 g/dL (ref 30.0–36.0)
MCV: 94.4 fL (ref 78.0–100.0)
Platelets: 235 10*3/uL (ref 150–400)
RBC: 4.28 MIL/uL (ref 3.87–5.11)
RDW: 13.7 % (ref 11.5–15.5)
WBC: 6.5 10*3/uL (ref 4.0–10.5)

## 2013-09-04 LAB — BASIC METABOLIC PANEL
BUN: 12 mg/dL (ref 6–23)
CO2: 28 mEq/L (ref 19–32)
Calcium: 10.1 mg/dL (ref 8.4–10.5)
Chloride: 102 mEq/L (ref 96–112)
Creatinine, Ser: 0.66 mg/dL (ref 0.50–1.10)
GFR calc Af Amer: >90 mL/min (ref >90)
GFR calc non Af Amer: 84 mL/min — ABNORMAL LOW (ref >90)
Glucose, Bld: 105 mg/dL — ABNORMAL HIGH (ref 70–99)
Potassium: 4.5 mEq/L (ref 3.7–5.3)
Sodium: 142 mEq/L (ref 137–147)

## 2013-09-04 LAB — PROTIME-INR
INR: 1.54 — ABNORMAL HIGH (ref 0.00–1.49)
Prothrombin Time: 18.1 seconds — ABNORMAL HIGH (ref 11.6–15.2)

## 2013-09-04 LAB — PRO B NATRIURETIC PEPTIDE: Pro B Natriuretic peptide (BNP): 495.1 pg/mL — ABNORMAL HIGH (ref 0–450)

## 2013-09-04 LAB — POCT I-STAT TROPONIN I: Troponin i, poc: 0 ng/mL (ref 0.00–0.08)

## 2013-09-04 MED ORDER — FUROSEMIDE 20 MG PO TABS: 20.0000 mg | ORAL_TABLET | Freq: Every day | ORAL | Status: DC

## 2013-09-04 MED ORDER — NITROGLYCERIN 0.4 MG SL SUBL
0.4000 mg | SUBLINGUAL_TABLET | SUBLINGUAL | Status: DC | PRN
  Administered 2013-09-04 (×2): 0.4 mg via SUBLINGUAL
  Filled 2013-09-04: qty 25

## 2013-09-04 MED ORDER — FUROSEMIDE 10 MG/ML IJ SOLN
40.0000 mg | INTRAMUSCULAR | Status: AC
  Administered 2013-09-04: 40 mg via INTRAVENOUS
  Filled 2013-09-04: qty 4

## 2013-09-04 NOTE — Progress Notes (Signed)
Subjective:    Patient ID: [name], female    DOB: [birth year], 78 y.o.   MRN: [id number]  HPI Patient was seen by her cardiologist in October. At that time she was complaining of exertional chest pain that would resolve with rest. Imdur was increased. However over the last few months the shortness of breath with exertion has worsened. Over the last 3 days the patient has been getting substernal chest pain that radiates to her back at rest.  She also is having worsening dyspnea on exertion. EKG obtained today in the office shows atrial fibrillation with new Q-wave in III and T-wave inversion in aVF.  This is new compared to the EKG obtained in June 2014. The patient has no exacerbating or alleviating factors. Her shortness of breath improves with rest however the chest pain occurs randomly now even at rest. At present she is having no chest pain. Past Medical History  Diagnosis Date  . Cancer     right colon and left breast  . Breast cancer 01/06/2012  . Colon cancer 01/06/2012  . Hernia   . GERD (gastroesophageal reflux disease)   . Hypertension   . Allergy     rhinitis  . COPD (chronic obstructive pulmonary disease)   . Osteopenia   . Diverticulosis   . Pneumonia     hx child  . Hiatal hernia     denies  . Arthritis   . Coronary artery disease 2006    nonobstructive with 20-30% ostial LAD and LM  . Atrial fibrillation     chronic atrial fibrillation  . Edema extremities   . Hyperlipidemia    Past Surgical History  Procedure Laterality Date  . Colectomy      right side  . Mastectomy partial / lumpectomy  2008    left breast  . Cardiac catheterization    . Breast surgery      lumpectomy left  . Eye surgery Bilateral 12    cataracts  . Appendectomy    . Cholecystectomy    . Excision of accessory nipple Bilateral 05/30/2013    Procedure: BILATERAL NIPPLE BIOPSY;  Surgeon: Robyne Askew, MD;  Location: [facility] OR;  Service: General;  Laterality: Bilateral;   Current Outpatient  Prescriptions on File Prior to Visit  Medication Sig Dispense Refill  . Cholecalciferol (VITAMIN D) 2000 UNITS CAPS Take 1 capsule by mouth daily.      Marland Kitchen diltiazem (CARDIZEM CD) 180 MG 24 hr capsule Take 180 mg by mouth daily.      . furosemide (LASIX) 20 MG tablet Take 1 tablet (20 mg total) by mouth daily.  90 tablet  1  . isosorbide dinitrate (ISORDIL) 40 MG tablet Take 40 mg by mouth 2 (two) times daily.       Marland Kitchen losartan (COZAAR) 100 MG tablet Take 100 mg by mouth daily.      . nitroGLYCERIN (NITROSTAT) 0.4 MG SL tablet Place 1 tablet (0.4 mg total) under the tongue every 5 (five) minutes as needed for chest pain.  50 tablet  3  . omeprazole (PRILOSEC) 20 MG capsule Take 20 mg by mouth daily as needed (heartburn, acid reflux).      . simvastatin (ZOCOR) 10 MG tablet Take 10 mg by mouth at bedtime.      . triamcinolone (KENALOG) 0.025 % ointment Apply 1 application topically 2 (two) times daily.  30 g  1  . warfarin (COUMADIN) 2.5 MG tablet Takes 2.5 mg on tues,  thurs, and sundays. Takes 3 mg all other days       No current facility-administered medications on file prior to visit.   Allergies  Allergen Reactions  . Contrast Media [Iodinated Diagnostic Agents] Other (See Comments)    Per pt strong burning sensation starting in chest radiating outward  . Celebrex [Celecoxib]   . Statins   . Tape Itching and Rash    Red Where applied and will spread   History   Social History  . Marital Status: Married    Spouse Name: N/A    Number of Children: N/A  . Years of Education: N/A   Occupational History  . Not on file.   Social History Main Topics  . Smoking status: Never Smoker   . Smokeless tobacco: Never Used  . Alcohol Use: No  . Drug Use: No  . Sexual Activity: Not Currently   Other Topics Concern  . Not on file   Social History Narrative  . No narrative on file      Review of Systems  All other systems reviewed and are negative.       Objective:   Physical  Exam  Vitals reviewed. Cardiovascular: Normal rate and normal heart sounds.  Exam reveals no gallop and no friction rub.   No murmur heard. Pulmonary/Chest: Effort normal and breath sounds normal. No respiratory distress. She has no wheezes. She has no rales. She exhibits no tenderness.  Abdominal: Soft. Bowel sounds are normal. She exhibits no distension and no mass. There is no tenderness. There is no rebound and no guarding.  Musculoskeletal: She exhibits no edema.   patient has an irregularly irregular heart rhythm. EKG shows atrial fibrillation with Q-wave in lead 3 nonspecific ST flattening and T wave inversion in aVF which is new.        Assessment & Plan:  1. Unstable angina I am concerned the patient's symptoms represent unstable angina. I recommended she go immediately to the emergency room. I do feel she is stable to be driven by her husband. I did not feel she needed an ambulance as she is currently pain-free.  The emergency room was notified.

## 2013-09-04 NOTE — ED Notes (Signed)
Pt ambulatory in hallway with SpO2 96%, very mild DOE note, pt denies pain. EDPA aware

## 2013-09-04 NOTE — ED Provider Notes (Signed)
CSN: [id number]     Arrival date & time 09/04/13  1256 History   First MD Initiated Contact with Patient 09/04/13 1412     Chief Complaint  Patient presents with  . Chest Pain   (Consider location/radiation/quality/duration/timing/severity/associated sxs/prior Treatment) The history is provided by the patient and medical records.   This is a 78 year old female with past medical history significant for hypertension, hyperlipidemia, atrial fibrillation, CAD, COPD, presenting to the ED for right chest tightness for the past 3 days. No radiation into the extremities or neck, diaphoresis, palpitations, dizziness, weakness, nausea, or vomiting.  Patient also endorses progressively worsening shortness of breath-- worse with exertion, better with lying still. States length she can walk without dyspnea varies daily.  Pt denies orthopnea.  Pt has had increased LE peripheral edema but no significant change in weight over the past week.  Has been complaint with her home lasix.  Denies any cough, cold, or chest congestion.  No fevers, sweats, or chills.  No SL NTG taken PTA.  VS stable on arrival. Pt is followed by cardiology-- Dr. Mayford Knife  Past Medical History  Diagnosis Date  . Cancer     right colon and left breast  . Breast cancer 01/06/2012  . Colon cancer 01/06/2012  . Hernia   . GERD (gastroesophageal reflux disease)   . Hypertension   . Allergy     rhinitis  . COPD (chronic obstructive pulmonary disease)   . Osteopenia   . Diverticulosis   . Pneumonia     hx child  . Hiatal hernia     denies  . Arthritis   . Coronary artery disease 2006    nonobstructive with 20-30% ostial LAD and LM  . Atrial fibrillation     chronic atrial fibrillation  . Edema extremities   . Hyperlipidemia    Past Surgical History  Procedure Laterality Date  . Colectomy      right side  . Mastectomy partial / lumpectomy  2008    left breast  . Cardiac catheterization    . Breast surgery      lumpectomy left   . Eye surgery Bilateral 12    cataracts  . Appendectomy    . Cholecystectomy    . Excision of accessory nipple Bilateral 05/30/2013    Procedure: BILATERAL NIPPLE BIOPSY;  Surgeon: Robyne Askew, MD;  Location: [facility];  Service: General;  Laterality: Bilateral;   Family History  Problem Relation Age of Onset  . Heart disease Mother   . Cancer Sister     stomach and colon  . Heart disease Brother 55  . Cancer Sister    History  Substance Use Topics  . Smoking status: Never Smoker   . Smokeless tobacco: Never Used  . Alcohol Use: No   OB History   Grav Para Term Preterm Abortions TAB SAB Ect Mult Living                 Review of Systems  Respiratory: Positive for shortness of breath.   Cardiovascular: Positive for chest pain.  All other systems reviewed and are negative.    Allergies  Contrast media; Celebrex; Statins; and Tape  Home Medications   Current Outpatient Rx  Name  Route  Sig  Dispense  Refill  . Cholecalciferol (VITAMIN D) 2000 UNITS CAPS   Oral   Take 1 capsule by mouth daily.         Marland Kitchen diltiazem (CARDIZEM CD) 180 MG 24 hr capsule  Oral   Take 180 mg by mouth daily.         . furosemide (LASIX) 20 MG tablet   Oral   Take 1 tablet (20 mg total) by mouth daily.   90 tablet   1   . isosorbide dinitrate (ISORDIL) 40 MG tablet   Oral   Take 40 mg by mouth 2 (two) times daily.          Marland Kitchen losartan (COZAAR) 100 MG tablet   Oral   Take 100 mg by mouth daily.         . nitroGLYCERIN (NITROSTAT) 0.4 MG SL tablet   Sublingual   Place 1 tablet (0.4 mg total) under the tongue every 5 (five) minutes as needed for chest pain.   50 tablet   3   . omeprazole (PRILOSEC) 20 MG capsule   Oral   Take 20 mg by mouth daily as needed (heartburn, acid reflux).         . simvastatin (ZOCOR) 10 MG tablet   Oral   Take 10 mg by mouth at bedtime.         . triamcinolone (KENALOG) 0.025 % ointment   Topical   Apply 1 application topically 2  (two) times daily.   30 g   1   . warfarin (COUMADIN) 2.5 MG tablet      Takes 2.5 mg on tues, thurs, and sundays. Takes 3 mg all other days          BP 195/70  Pulse 74  Temp(Src) 99 F (37.2 C) (Oral)  Resp 18  SpO2 97%  Physical Exam  Nursing note and vitals reviewed. Constitutional: She is oriented to person, place, and time. She appears well-developed and well-nourished. No distress.  HENT:  Head: Normocephalic and atraumatic.  Mouth/Throat: Oropharynx is clear and moist.  Eyes: Conjunctivae and EOM are normal. Pupils are equal, round, and reactive to light.  Neck: Normal range of motion. Neck supple.  Cardiovascular: Normal rate and normal heart sounds.  An irregularly irregular rhythm present.  Pulmonary/Chest: Effort normal and breath sounds normal. No accessory muscle usage. Not tachypneic. No respiratory distress. She has no wheezes. She has no rhonchi. She has no rales.  Lungs clear bilaterally; no audible wheezes, rhonchi, or rales  Abdominal: Soft. Bowel sounds are normal.  Musculoskeletal: Normal range of motion.  2+ pitting edema BLE  Neurological: She is alert and oriented to person, place, and time.  Skin: Skin is warm and dry. She is not diaphoretic.  Psychiatric: She has a normal mood and affect.    ED Course  Procedures (including critical care time)   Date: 09/04/2013  Rate: 71  Rhythm: atrial fibrillation  QRS Axis: indeterminate  Intervals: indeterminate  ST/T Wave abnormalities: indeterminate  Conduction Disutrbances:indeterminate  Narrative Interpretation:   Old EKG Reviewed: unchanged   Labs Review Labs Reviewed  BASIC METABOLIC PANEL - Abnormal; Notable for the following:    Glucose, Bld 105 (*)    GFR calc non Af Amer 84 (*)    All other components within normal limits  PRO B NATRIURETIC PEPTIDE - Abnormal; Notable for the following:    Pro B Natriuretic peptide (BNP) 495.1 (*)    All other components within normal limits   PROTIME-INR - Abnormal; Notable for the following:    Prothrombin Time 18.1 (*)    INR 1.54 (*)    All other components within normal limits  CBC  POCT I-STAT TROPONIN I  Imaging Review Dg Chest 2 View  09/04/2013   CLINICAL DATA:  Chest pain.  EXAM: CHEST  2 VIEW  COMPARISON:  05/28/2013.  FINDINGS: Cardiomegaly with pulmonary venous congestion and interstitial prominence with Kerley B-lines. These findings are consistent with congestive heart failure with interstitial edema. No pneumothorax. No acute bony abnormality. Diffuse osteopenia and degenerative changes with stable lower thoracic mild vertebral body compression fracture.  IMPRESSION: Congestive heart failure with pulmonary interstitial edema.   Electronically Signed   By: Maisie Fus  Register   On: 09/04/2013 13:38    EKG Interpretation   None       MDM   1. Shortness of breath   2. Chest pain    EKG AFIB, rate controlled-- pt has hx of the same.  Trop negative.  BNP elevated at 495.  CXR with pulmonary interstitual edema.  Pt with increased peripheral edema, clear lung sounds bilaterally, O2 sats stable on RA, no signs of respiratory distress.  SL NTG given, will diurese in the ED and reassess.  Pt given additional dose of IV lasix.  Ambulated around ED maintaining O2 sats of 96-98%.  Chest pain has resolved-- low suspicion for ACS, PE, dissection, or acute cardiac event at this time.  Sx likely due to increased pulmonary vascular congestion.  Will increase home lasix for the next few days, FU with Cardiology within the next week to discuss this ED visit.  Signs/sx that would warrant ED return including increased SOB, recurrent chest pain, palpitations, dizziness, diaphoresis or weakness-- pt and husband acknowledged understanding and agree with plan of care.  Discussed case with Dr. Juleen China who personally evaluated pt and agrees with assessment and plan of care.  Garlon Hatchet, PA-C 09/04/13 1826  Garlon Hatchet,  PA-C 09/04/13 [phone]

## 2013-09-04 NOTE — ED Notes (Signed)
Pt c/o CP and tightness x 3 days with some SOB

## 2013-09-04 NOTE — ED Notes (Signed)
PT comfortable with d/c and f/u instructions.

## 2013-09-09 NOTE — ED Provider Notes (Signed)
Medical screening examination/treatment/procedure(s) were conducted as a shared visit with non-physician practitioner(s) and myself.  I personally evaluated the patient during the encounter.  EKG Interpretation    Date/Time:  Tuesday September 04 2013 13:00:00 EST Ventricular Rate:  71 PR Interval:    QRS Duration: 82 QT Interval:  404 QTC Calculation: 439 R Axis:   66 Text Interpretation:  Atrial fibrillation No significant change since last tracing Confirmed by Wilson Singer  MD, Trayon Krantz ([id number]) on 09/04/2013 2:35:17 PM           79 year old female with chest tightness. Atypical for ACS. EKG shows future for prolate tumor she has a history of. Rate controlled. Mild elevation of BNP. Some interstitial edema her chest x-ray is perfectly on exam. O2 sats are fine on room air. There is significantly increased work of breathing. Will increase her Lasix for the next few days. Outpatient followup with cardiology. Return precautions were discussed.  Virgel Manifold, MD 09/09/13 [phone]

## 2013-09-23 ENCOUNTER — Other Ambulatory Visit: Payer: Self-pay | Admitting: *Deleted

## 2013-09-23 DIAGNOSIS — Z79899 Other long term (current) drug therapy: Secondary | ICD-10-CM

## 2013-09-23 DIAGNOSIS — E7800 Pure hypercholesterolemia, unspecified: Secondary | ICD-10-CM

## 2013-09-27 ENCOUNTER — Encounter: Payer: Self-pay | Admitting: Family Medicine

## 2013-10-02 ENCOUNTER — Telehealth: Payer: Self-pay | Admitting: Family Medicine

## 2013-10-02 NOTE — Telephone Encounter (Signed)
Pt called for an appointment.  I gave her one.

## 2013-10-05 ENCOUNTER — Ambulatory Visit (INDEPENDENT_AMBULATORY_CARE_PROVIDER_SITE_OTHER): Payer: BC Managed Care – HMO | Admitting: Family Medicine

## 2013-10-05 ENCOUNTER — Encounter: Payer: Self-pay | Admitting: Family Medicine

## 2013-10-05 VITALS — BP 142/56 | HR 66 | Temp 98.5°F | Resp 18 | Ht 67.0 in | Wt 237.0 lb

## 2013-10-05 DIAGNOSIS — I4891 Unspecified atrial fibrillation: Secondary | ICD-10-CM

## 2013-10-05 DIAGNOSIS — M7582 Other shoulder lesions, left shoulder: Secondary | ICD-10-CM

## 2013-10-05 DIAGNOSIS — M719 Bursopathy, unspecified: Secondary | ICD-10-CM

## 2013-10-05 DIAGNOSIS — M67919 Unspecified disorder of synovium and tendon, unspecified shoulder: Secondary | ICD-10-CM

## 2013-10-05 LAB — PT WITH INR/FINGERSTICK
INR, fingerstick: 2.3 — ABNORMAL HIGH (ref 0.80–1.20)
PT, fingerstick: 27.6 s — ABNORMAL HIGH (ref 10.4–12.5)

## 2013-10-05 NOTE — Progress Notes (Signed)
Subjective:    Patient ID: [name], female    DOB: [birth year], 79 y.o.   MRN: [id number]  HPI Patient is a very pleasant 79 year old white female who presents today complaining of pain in her left ankle as well as her left shoulder after a fall. She is also here to have her Coumadin recheck. 2 weeks ago she still an appendectomy in the hospital and she sprained her left ankle. She also strained her arm and she reached to brace herself. She reports pain with abduction greater than 90. She reports a dull ache underneath her collarbone at night when she tries to sleep. She reports pain with range of motion including internal and external rotation of the shoulder. She did not fall or strike the shoulder. The pain in her left ankle is gradually improving. She has some mild tenderness over the medial malleolus. Her INR today in clinic is therapeutic at 2.3. Past Medical History  Diagnosis Date  . Cancer     right colon and left breast  . Breast cancer 01/06/2012  . Colon cancer 01/06/2012  . Hernia   . GERD (gastroesophageal reflux disease)   . Hypertension   . Allergy     rhinitis  . COPD (chronic obstructive pulmonary disease)   . Osteopenia   . Diverticulosis   . Pneumonia     hx child  . Hiatal hernia     denies  . Arthritis   . Coronary artery disease 2006    nonobstructive with 20-30% ostial LAD and LM  . Atrial fibrillation     chronic atrial fibrillation  . Edema extremities   . Hyperlipidemia    Current Outpatient Prescriptions on File Prior to Visit  Medication Sig Dispense Refill  . acetaminophen (TYLENOL) 325 MG tablet Take 650 mg by mouth every 6 (six) hours as needed for mild pain.      . Cholecalciferol (VITAMIN D) 2000 UNITS CAPS Take 1 capsule by mouth daily.      Marland Kitchen diltiazem (CARDIZEM CD) 180 MG 24 hr capsule Take 180 mg by mouth daily.      . furosemide (LASIX) 20 MG tablet Take 1 tablet (20 mg total) by mouth daily.  90 tablet  1  . furosemide (LASIX) 20 MG tablet  Take 1 tablet (20 mg total) by mouth daily.  30 tablet  0  . isosorbide dinitrate (ISORDIL) 40 MG tablet Take 40 mg by mouth daily.       Marland Kitchen losartan (COZAAR) 100 MG tablet Take 100 mg by mouth daily.      . nitroGLYCERIN (NITROSTAT) 0.4 MG SL tablet Place 1 tablet (0.4 mg total) under the tongue every 5 (five) minutes as needed for chest pain.  50 tablet  3  . omeprazole (PRILOSEC) 20 MG capsule Take 20 mg by mouth daily as needed (heartburn, acid reflux).      . simvastatin (ZOCOR) 10 MG tablet Take 10 mg by mouth at bedtime.      . triamcinolone (KENALOG) 0.025 % ointment Apply 1 application topically 2 (two) times daily.  30 g  1  . warfarin (COUMADIN) 1 MG tablet Take 1 mg by mouth See admin instructions. Take half a tablet (0.5 mg) with half a tablet of 5 mg (total 3 mg) on all other days (Not Tuesdays, Thursdays, and Sundays).      . warfarin (COUMADIN) 5 MG tablet Take 2.5 mg by mouth See admin instructions. Take half a tablet (2.5 mg) on Tuesdays,  Thursdays, and Sundays. Take half a tablet with half a tablet of 1 mg (total 3 mg) on all other days.       No current facility-administered medications on file prior to visit.   Allergies  Allergen Reactions  . Contrast Media [Iodinated Diagnostic Agents] Other (See Comments)    Per pt strong burning sensation starting in chest radiating outward  . Celebrex [Celecoxib]   . Statins   . Tape Itching and Rash    Red Where applied and will spread   History   Social History  . Marital Status: Married    Spouse Name: N/A    Number of Children: N/A  . Years of Education: N/A   Occupational History  . Not on file.   Social History Main Topics  . Smoking status: Never Smoker   . Smokeless tobacco: Never Used  . Alcohol Use: No  . Drug Use: No  . Sexual Activity: Not Currently   Other Topics Concern  . Not on file   Social History Narrative  . No narrative on file      Review of Systems  All other systems reviewed and are  negative.       Objective:   Physical Exam  Cardiovascular: Normal rate and normal heart sounds.   Pulmonary/Chest: Effort normal and breath sounds normal.  Musculoskeletal:       Left shoulder: She exhibits decreased range of motion, tenderness and pain. She exhibits no bony tenderness, no swelling, no effusion, no crepitus, no spasm and normal strength.       Left ankle: She exhibits normal range of motion, no swelling and no ecchymosis. Tenderness. Medial malleolus tenderness found. No lateral malleolus, no AITFL, no CF ligament, no posterior TFL, no head of 5th metatarsal and no proximal fibula tenderness found. Achilles tendon normal.          Assessment & Plan:  1. Atrial fibrillation INR is therapeutic. Continue Coumadin his present dose. Recheck INR in 4 weeks - PT with INR/Fingerstick; Standing - PT with INR/Fingerstick  2. Tendonitis of left rotator cuff Recommended Tylenol 500 mg by mouth twice a day for the next week. Her pain is not improved I recommend Cortizone injection into the left subacromial space. I do not feel the patient has told her rotator cuff. I believe she developed bursitis and tendinitis in the shoulder. Socially she is unable to tolerate NSAIDs due to her use of Coumadin.

## 2013-10-16 ENCOUNTER — Other Ambulatory Visit: Payer: Medicare Other

## 2013-10-29 ENCOUNTER — Other Ambulatory Visit: Payer: Self-pay | Admitting: General Surgery

## 2013-10-29 ENCOUNTER — Encounter: Payer: Self-pay | Admitting: General Surgery

## 2013-10-29 ENCOUNTER — Other Ambulatory Visit (INDEPENDENT_AMBULATORY_CARE_PROVIDER_SITE_OTHER): Payer: Medicare Other

## 2013-10-29 DIAGNOSIS — Z79899 Other long term (current) drug therapy: Secondary | ICD-10-CM

## 2013-10-29 DIAGNOSIS — E7800 Pure hypercholesterolemia, unspecified: Secondary | ICD-10-CM

## 2013-10-29 DIAGNOSIS — E785 Hyperlipidemia, unspecified: Secondary | ICD-10-CM

## 2013-10-29 LAB — LIPID PANEL
Cholesterol: 130 mg/dL (ref 0–200)
HDL: 54.5 mg/dL (ref >39.00)
LDL Cholesterol: 62 mg/dL (ref 0–99)
TRIGLYCERIDES: 67 mg/dL (ref 0.0–149.0)
Total CHOL/HDL Ratio: 2
VLDL: 13.4 mg/dL (ref 0.0–40.0)

## 2013-10-29 LAB — HEPATIC FUNCTION PANEL
ALBUMIN: 3.9 g/dL (ref 3.5–5.2)
ALT: 18 U/L (ref 0–35)
AST: 20 U/L (ref 0–37)
Alkaline Phosphatase: 68 U/L (ref 39–117)
Bilirubin, Direct: 0 mg/dL (ref 0.0–0.3)
TOTAL PROTEIN: 7.7 g/dL (ref 6.0–8.3)
Total Bilirubin: 0.8 mg/dL (ref 0.3–1.2)

## 2013-11-04 ENCOUNTER — Emergency Department (HOSPITAL_COMMUNITY): Payer: Medicare Other

## 2013-11-04 ENCOUNTER — Emergency Department (HOSPITAL_COMMUNITY)
Admission: EM | Admit: 2013-11-04 | Discharge: 2013-11-04 | Disposition: A | Discharge status: Home / Self Care | Payer: Medicare Other | Attending: Emergency Medicine | Admitting: Emergency Medicine

## 2013-11-04 ENCOUNTER — Encounter (HOSPITAL_COMMUNITY): Payer: Self-pay | Admitting: Emergency Medicine

## 2013-11-04 DIAGNOSIS — M51379 Other intervertebral disc degeneration, lumbosacral region without mention of lumbar back pain or lower extremity pain: Secondary | ICD-10-CM | POA: Insufficient documentation

## 2013-11-04 DIAGNOSIS — K219 Gastro-esophageal reflux disease without esophagitis: Secondary | ICD-10-CM | POA: Insufficient documentation

## 2013-11-04 DIAGNOSIS — S298XXA Other specified injuries of thorax, initial encounter: Secondary | ICD-10-CM | POA: Insufficient documentation

## 2013-11-04 DIAGNOSIS — Z8701 Personal history of pneumonia (recurrent): Secondary | ICD-10-CM | POA: Insufficient documentation

## 2013-11-04 DIAGNOSIS — W108XXA Fall (on) (from) other stairs and steps, initial encounter: Secondary | ICD-10-CM | POA: Insufficient documentation

## 2013-11-04 DIAGNOSIS — S32010A Wedge compression fracture of first lumbar vertebra, initial encounter for closed fracture: Secondary | ICD-10-CM

## 2013-11-04 DIAGNOSIS — S300XXA Contusion of lower back and pelvis, initial encounter: Secondary | ICD-10-CM

## 2013-11-04 DIAGNOSIS — IMO0002 Reserved for concepts with insufficient information to code with codable children: Secondary | ICD-10-CM | POA: Insufficient documentation

## 2013-11-04 DIAGNOSIS — W009XXA Unspecified fall due to ice and snow, initial encounter: Secondary | ICD-10-CM

## 2013-11-04 DIAGNOSIS — Z8739 Personal history of other diseases of the musculoskeletal system and connective tissue: Secondary | ICD-10-CM | POA: Insufficient documentation

## 2013-11-04 DIAGNOSIS — M47816 Spondylosis without myelopathy or radiculopathy, lumbar region: Secondary | ICD-10-CM

## 2013-11-04 DIAGNOSIS — S32009A Unspecified fracture of unspecified lumbar vertebra, initial encounter for closed fracture: Secondary | ICD-10-CM | POA: Insufficient documentation

## 2013-11-04 DIAGNOSIS — Z85038 Personal history of other malignant neoplasm of large intestine: Secondary | ICD-10-CM | POA: Insufficient documentation

## 2013-11-04 DIAGNOSIS — I2510 Atherosclerotic heart disease of native coronary artery without angina pectoris: Secondary | ICD-10-CM | POA: Insufficient documentation

## 2013-11-04 DIAGNOSIS — Z7901 Long term (current) use of anticoagulants: Secondary | ICD-10-CM | POA: Insufficient documentation

## 2013-11-04 DIAGNOSIS — Y929 Unspecified place or not applicable: Secondary | ICD-10-CM | POA: Insufficient documentation

## 2013-11-04 DIAGNOSIS — Z79899 Other long term (current) drug therapy: Secondary | ICD-10-CM | POA: Insufficient documentation

## 2013-11-04 DIAGNOSIS — Y939 Activity, unspecified: Secondary | ICD-10-CM | POA: Insufficient documentation

## 2013-11-04 DIAGNOSIS — I4891 Unspecified atrial fibrillation: Secondary | ICD-10-CM | POA: Insufficient documentation

## 2013-11-04 DIAGNOSIS — E785 Hyperlipidemia, unspecified: Secondary | ICD-10-CM | POA: Insufficient documentation

## 2013-11-04 DIAGNOSIS — M5137 Other intervertebral disc degeneration, lumbosacral region: Secondary | ICD-10-CM | POA: Insufficient documentation

## 2013-11-04 DIAGNOSIS — I10 Essential (primary) hypertension: Secondary | ICD-10-CM | POA: Insufficient documentation

## 2013-11-04 DIAGNOSIS — J449 Chronic obstructive pulmonary disease, unspecified: Secondary | ICD-10-CM | POA: Insufficient documentation

## 2013-11-04 DIAGNOSIS — Z853 Personal history of malignant neoplasm of breast: Secondary | ICD-10-CM | POA: Insufficient documentation

## 2013-11-04 DIAGNOSIS — S20229A Contusion of unspecified back wall of thorax, initial encounter: Secondary | ICD-10-CM | POA: Insufficient documentation

## 2013-11-04 DIAGNOSIS — Z9889 Other specified postprocedural states: Secondary | ICD-10-CM | POA: Insufficient documentation

## 2013-11-04 DIAGNOSIS — J4489 Other specified chronic obstructive pulmonary disease: Secondary | ICD-10-CM | POA: Insufficient documentation

## 2013-11-04 LAB — URINALYSIS, ROUTINE W REFLEX MICROSCOPIC
Bilirubin Urine: NEGATIVE
GLUCOSE, UA: NEGATIVE mg/dL
Hgb urine dipstick: NEGATIVE
Ketones, ur: NEGATIVE mg/dL
LEUKOCYTES UA: NEGATIVE
Nitrite: NEGATIVE
PH: 5.5 (ref 5.0–8.0)
Protein, ur: NEGATIVE mg/dL
Specific Gravity, Urine: 1.012 (ref 1.005–1.030)
Urobilinogen, UA: 0.2 mg/dL (ref 0.0–1.0)

## 2013-11-04 MED ORDER — TRAMADOL HCL 50 MG PO TABS
50.0000 mg | ORAL_TABLET | Freq: Once | ORAL | Status: AC
  Administered 2013-11-04: 50 mg via ORAL
  Filled 2013-11-04: qty 1

## 2013-11-04 MED ORDER — TRAMADOL HCL 50 MG PO TABS: 50.0000 mg | ORAL_TABLET | Freq: Four times a day (QID) | ORAL | Status: DC | PRN

## 2013-11-04 MED ORDER — FENTANYL CITRATE 0.05 MG/ML IJ SOLN
100.0000 ug | Freq: Once | INTRAMUSCULAR | Status: AC
  Administered 2013-11-04: 100 ug via INTRAVENOUS
  Filled 2013-11-04: qty 2

## 2013-11-04 NOTE — ED Notes (Signed)
Pt comfortable with d/c and follow up instructions. Prescriptions x1.

## 2013-11-04 NOTE — ED Provider Notes (Signed)
CSN: [id number]     Arrival date & time 11/04/13  1205 History   First MD Initiated Contact with Patient 11/04/13 1210     Chief Complaint  Patient presents with  . Fall  . Hip Pain     (Consider location/radiation/quality/duration/timing/severity/associated sxs/prior Treatment) Patient is a 79 y.o. female presenting with fall and hip pain. The history is provided by the patient.  Fall  Hip Pain   She slipped and fell going down several steps in the fall on her buttocks. She was able to get up and walk afterwards, on her own. Causes of fall was slipped on ice. She did not hit her head. She denies neck pain, paresthesias, or weakness, nausea, vomiting. She has pain primarily in the right lower back and right chest. She does not have a gait disorder. She has not been in recently. There are no other known modifying factors.  Past Medical History  Diagnosis Date  . Cancer     right colon and left breast  . Breast cancer 01/06/2012  . Colon cancer 01/06/2012  . Hernia   . GERD (gastroesophageal reflux disease)   . Hypertension   . Allergy     rhinitis  . Osteopenia   . Diverticulosis   . Pneumonia     hx child  . Hiatal hernia     denies  . Arthritis   . Coronary artery disease 2006    nonobstructive with 20-30% ostial LAD and LM  . Atrial fibrillation     chronic atrial fibrillation  . Edema extremities   . Hyperlipidemia   . COPD (chronic obstructive pulmonary disease)    Past Surgical History  Procedure Laterality Date  . Colectomy      right side  . Mastectomy partial / lumpectomy  2008    left breast  . Cardiac catheterization    . Breast surgery      lumpectomy left  . Eye surgery Bilateral 12    cataracts  . Appendectomy    . Cholecystectomy    . Excision of accessory nipple Bilateral 05/30/2013    Procedure: BILATERAL NIPPLE BIOPSY;  Surgeon: Merrie Roof, MD;  Location: [facility];  Service: General;  Laterality: Bilateral;   Family History  Problem Relation  Age of Onset  . Heart disease Mother   . Cancer Sister     stomach and colon  . Heart disease Brother 41  . Cancer Sister    History  Substance Use Topics  . Smoking status: Never Smoker   . Smokeless tobacco: Never Used  . Alcohol Use: No   OB History   Grav Para Term Preterm Abortions TAB SAB Ect Mult Living                 Review of Systems  All other systems reviewed and are negative.      Allergies  Contrast media; Statins; Celebrex; and Tape  Home Medications   Current Outpatient Rx  Name  Route  Sig  Dispense  Refill  . acetaminophen (TYLENOL) 325 MG tablet   Oral   Take 650 mg by mouth every 6 (six) hours as needed for mild pain.         . Cholecalciferol (VITAMIN D) 2000 UNITS CAPS   Oral   Take 1 capsule by mouth daily.         Marland Kitchen diltiazem (CARDIZEM CD) 180 MG 24 hr capsule   Oral   Take 180 mg by mouth daily.         Marland Kitchen  furosemide (LASIX) 20 MG tablet   Oral   Take 1 tablet (20 mg total) by mouth daily.   90 tablet   1   . isosorbide dinitrate (ISORDIL) 40 MG tablet   Oral   Take 40 mg by mouth daily.          Marland Kitchen losartan (COZAAR) 100 MG tablet   Oral   Take 100 mg by mouth daily.         . nitroGLYCERIN (NITROSTAT) 0.4 MG SL tablet   Sublingual   Place 1 tablet (0.4 mg total) under the tongue every 5 (five) minutes as needed for chest pain.   50 tablet   3   . omeprazole (PRILOSEC) 20 MG capsule   Oral   Take 20 mg by mouth daily as needed (heartburn, acid reflux).         Marland Kitchen OVER THE COUNTER MEDICATION   Both Eyes   Place 1 drop into both eyes daily as needed (dry eyes). Eye drops         . simvastatin (ZOCOR) 10 MG tablet   Oral   Take 10 mg by mouth at bedtime.         . triamcinolone (KENALOG) 0.025 % ointment   Topical   Apply 1 application topically 2 (two) times daily.   30 g   1   . warfarin (COUMADIN) 1 MG tablet   Oral   Take 1 mg by mouth See admin instructions. Take half a tablet (0.5 mg) with  half a tablet of 5 mg (total 3 mg) on all other days (Not Tuesdays, Thursdays, and Sundays).         . warfarin (COUMADIN) 5 MG tablet   Oral   Take 2.5 mg by mouth See admin instructions. Take half a tablet (2.5 mg) on Tuesdays, Thursdays, and Sundays. Take half a tablet with half a tablet of 1 mg (total 3 mg) on all other days.         . traMADol (ULTRAM) 50 MG tablet   Oral   Take 1 tablet (50 mg total) by mouth every 6 (six) hours as needed.   30 tablet   0    BP 146/57  Pulse 76  Temp(Src) 98.6 F (37 C) (Oral)  Resp 18  Ht 5\' 7"  (1.702 m)  Wt 240 lb (108.863 kg)  BMI 37.58 kg/m2  SpO2 97% Physical Exam  Nursing note and vitals reviewed. Constitutional: She is oriented to person, place, and time. She appears well-developed and well-nourished.  HENT:  Head: Normocephalic and atraumatic.  Eyes: Conjunctivae and EOM are normal. Pupils are equal, round, and reactive to light.  Neck: Normal range of motion and phonation normal. Neck supple.  Cardiovascular: Normal rate, regular rhythm and intact distal pulses.   Pulmonary/Chest: Effort normal and breath sounds normal. She exhibits tenderness (diffuse right chest wall tenderness without crepitus, deformity or ecchymosis.).  Abdominal: Soft. She exhibits no distension. There is no tenderness. There is no guarding.  Musculoskeletal: Normal range of motion.  Mild right lateral lumbar tenderness without deformity. There is no tenderness to palpation of the cervical, thoracic, or lumbar spine  Neurological: She is alert and oriented to person, place, and time. She exhibits normal muscle tone.  Skin: Skin is warm and dry.  Psychiatric: She has a normal mood and affect. Her behavior is normal. Judgment and thought content normal.    ED Course  Procedures (including critical care time) Medications  fentaNYL (SUBLIMAZE) injection  100 mcg (100 mcg Intravenous Given 11/04/13 1236)  traMADol (ULTRAM) tablet 50 mg (50 mg Oral Given  11/04/13 1529)    Patient Vitals for the past 24 hrs:  BP Temp Temp src Pulse Resp SpO2 Height Weight  11/04/13 1418 146/57 mmHg - - 76 - 97 % - -  11/04/13 1310 164/49 mmHg - - 75 18 94 % - -  11/04/13 1245 149/44 mmHg - - 76 - 92 % - -  11/04/13 1230 127/53 mmHg - - 65 - 93 % - -  11/04/13 1215 146/55 mmHg - - 81 - 93 % - -  11/04/13 1213 176/49 mmHg 98.6 F (37 C) Oral 66 18 94 % 5\' 7"  (1.702 m) 240 lb (108.863 kg)  11/04/13 1209 - - - - - 94 % - -    3:57 PM Reevaluation with update and discussion. After initial assessment and treatment, an updated evaluation reveals no additional complaints. Findings discussed, limitations of evaluation discussed, all questions answered. Amerika Nourse L    Labs Review Labs Reviewed  URINALYSIS, ROUTINE W REFLEX MICROSCOPIC   Imaging Review Dg Ribs Unilateral W/chest Right  11/04/2013   CLINICAL DATA:  Fall weight right-sided rib pain and back pain.  EXAM: RIGHT RIBS AND CHEST - 3+ VIEW  COMPARISON:  Chest x-ray 09/04/2013 and 05/28/2013 as well as chest CT 05/15/2004  FINDINGS: Lungs are adequately inflated without focal consolidation or effusion. There is mild stable chronic interstitial prominence. Small calcified granuloma over the right upper lobe. There is mild stable cardiomegaly. Calcified plaque is present over the thoracic aorta. There are degenerative changes of the spine. No evidence of acute rib fracture.  IMPRESSION: No acute cardiopulmonary disease and no evidence of acute rib fracture.   Electronically Signed   By: Marin Olp M.D.   On: 11/04/2013 14:19   Dg Lumbar Spine Complete  11/04/2013   CLINICAL DATA:  Fall.  EXAM: LUMBAR SPINE - COMPLETE 4+ VIEW  COMPARISON:  CT 01/13/2012  FINDINGS: Vertebral body alignment is within normal. There is moderate spondylosis throughout the lumbar spine. There is severe disc space narrowing at the L5-S1 level unchanged. There is mild disc space narrowing at the L1-2, L2-3 and L3-4 levels unchanged.  There is a mild anterior wedge compression deformity of L1 slightly worse. Facet arthropathy is present.  IMPRESSION: Moderate spondylosis with multilevel disc disease as described without significant change.  Mild anterior wedge compression fracture of L1 slightly worse.   Electronically Signed   By: Marin Olp M.D.   On: 11/04/2013 14:23     EKG Interpretation None      MDM   Final diagnoses:  Fall due to ice or snow  Compression fracture of L1 lumbar vertebra  Degenerative joint disease (DJD) of lumbar spine  Contusion of lower back    Mechanical fall with contusions and possible acute L1 compression fracture. She is neurologically intact. She isolated injury to the right flank. Urinalysis is normal. Doubt visceral injury, occult fracture or traumatic spine injury. There is no additional, requirement for evaluation or treatment, in emergency department.   Nursing Notes Reviewed/ Care Coordinated Applicable Imaging Reviewed Interpretation of Laboratory Data incorporated into ED treatment  The patient appears reasonably screened and/or stabilized for discharge and I doubt any other medical condition or other [facility] requiring further screening, evaluation, or treatment in the ED at this time prior to discharge.  Plan: Home Medications- tramadol; Home Treatments- rest, cryotherapy, use assistive device, such as cane or walker  for ambulation; return here if the recommended treatment, does not improve the symptoms; Recommended follow up- PCP, for check up in one week   Richarda Blade, MD 11/04/13 [phone]

## 2013-11-04 NOTE — ED Notes (Signed)
Pt presents from home via [facility] with c/o falling down 5 steps, pt reports fell back onto buttocks and slid down the steps. Pt c/o right hip and buttocks pain and right upper leg pain. Pt denies LOC, and is A&Ox4 at presents. Pt was able to ambulate back into house after fall and has no obvious deformity. Pt given 185mcg fentanyl PTA and now rates pain as 3/10 at rest. Pedal pulses moderate, skin warm, dry and intact.

## 2013-11-04 NOTE — Discharge Instructions (Signed)
There is a mild compression fracture of your L1 vertebrae. This is likely to be contributing to your pain. 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 These problems will likely improve with time. If you need to take the pain medicine, take a stool softener, such as, Colace, twice a day to help keep your stools, soft. Rest, as much as possible. Use ice on the sore areas 3 times a day for 2 days, after that, use heat. Use a cane, or walker when, you get up to walk   Contusion A contusion is a deep bruise. Contusions are the result of an injury that caused bleeding under the skin. The contusion Bigley turn blue, purple, or yellow. Minor injuries will give you a painless contusion, but more severe contusions Brzozowski stay painful and swollen for a few weeks.  CAUSES  A contusion is usually caused by a blow, trauma, or direct force to an area of the body. SYMPTOMS   Swelling and redness of the injured area.  Bruising of the injured area.  Tenderness and soreness of the injured area.  Pain. DIAGNOSIS  The diagnosis can be made by taking a history and physical exam. An X-ray, CT scan, or MRI Meuth be needed to determine if there were any associated injuries, such as fractures. TREATMENT  Specific treatment will depend on what area of the body was injured. In general, the best treatment for a contusion is resting, icing, elevating, and applying cold compresses to the injured area. Over-the-counter medicines Zipp also be recommended for pain control. Ask your caregiver what the best treatment is for your contusion. HOME CARE INSTRUCTIONS   Put ice on the injured area.  Put ice in a plastic bag.  Place a towel between your skin and the bag.  Leave the ice on for 15-20 minutes, 03-04 times a day.  Only take over-the-counter or prescription medicines for pain, discomfort, or fever as directed by your caregiver. Your caregiver Vanepps recommend avoiding anti-inflammatory medicines (aspirin,  ibuprofen, and naproxen) for 48 hours because these medicines Biehl increase bruising.  Rest the injured area.  If possible, elevate the injured area to reduce swelling. SEEK IMMEDIATE MEDICAL CARE IF:   You have increased bruising or swelling.  You have pain that is getting worse.  Your swelling or pain is not relieved with medicines. MAKE SURE YOU:   Understand these instructions.  Will watch your condition.  Will get help right away if you are not doing well or get worse. Document Released: 06/02/2005 Document Revised: 11/15/2011 Document Reviewed: 06/28/2011 [facility] Patient Information 2014 [place], [place].  Lumbar Fracture A fracture of a bone is the same as a break in the bone. A fracture in the lumbar area is a break that involves one of many parts that make up the 5 bones of the low back area. This is just above the pelvis.  CAUSES Most of these injuries occur as a result of an accident such as:  A fall.  A car accident.  Recreational activities.  A smaller number occur due to:  Industrial, farm, and aviation accidents.  Gunshot wounds and direct blows to the back.  Parachuting incidents. Most lumbar fractures affect the "building blocks" or the main portion of the spine known as the "vertebral bodies" (see the image on the right). A smaller number involve breaks to portions of bone that extend to the sides or backward behind the vertebral body. In the elderly, a sudden break can happen without an apparent  cause. This is because the bones of the back have become extremely thin and fragile. This condition is known as osteoporosis. SYMPTOMS Patients with lumbar fractures have severe pain even if the actual break is small or limited, and there is no injury to nearby nerves. More severe or complex injuries involving other bones and/or organs Pineo include:   Deformity of the back bones.  Swelling/bruising over the injured area.  Limited ability to move the affected  area.  Partial or complete loss of function of the bladder and/or bowels. (This Kluesner be due to injury to nearby nerves).  More severe injuries can also cause:  Loss of sensation and/or strength in the legs, feet, and toes.  Paralysis. DIAGNOSIS In most cases, a lumbar fracture will be suspected by what happened just prior to the onset of back pain. X-rays and special imaging (CT scan and MRI imaging) are used to confirm the diagnosis as well as finding out the type and severity of the break or breaks. These tests guide treatment. But there are times when special imaging cannot be done. For example, MRI cannot be done if there is an implanted metallic device (such as a pacemaker). In these cases, other tests and imaging are done. If there has been nerve damage, more tests can be done. These include:  Tests of nerve function through muscles (nerve conduction studies and electromyography).  Tests of bladder function (urodynamics).  Tests that focus on defining specific nerve problems before surgery and what improvement has come about after surgery (evoked potentials). TREATMENT Common injuries Walt involve a small break off of the main surface of the back bone. Or they Gan be in the form of a partial flattening or compression of the bone. Hospital care Tacker not be needed for these. Medicine for pain control, special back bracing, and limitations in activity are done first. Physical therapy follows later. Complex breaks, multiple fractures of the spine, or unstable injuries can damage the spinal cord. They Martelli require an operation to remove pressure from the nerves and/or spinal cord and to stabilize the broken pieces of bone. Each individual set of injuries is unique. The surgeon will take into consideration many things when planning the best surgical approach that will give the highest likelihood of a good outcome.  HOME CARE INSTRUCTIONS There is pain and stiffness in the back for weeks after a  vertebral fracture. Bed rest, pain medicine, and a slow return to activity are generally recommended. Neck and back braces Sermersheim be helpful in reducing pain and increasing mobility. When your pain allows, simple walking will help to begin the process of returning to normal activities. Exercises to improve motion and to strengthen the back Sheridan also be useful after the initial pain goes away. This will be guided by your caregiver and the team (nurses, physical therapists, occupational therapists, etc.) involved with your ongoing care. For the elderly, treatment for osteoporosis Ditullio be needed to help reduce the risk of fractures in the future. Arrange for follow-up care as recommended to assure proper long-term care and prevention of further spine injury. The failure to follow-up as recommended could result in permanent injury, disability, and a chronic painful condition. SEEK MEDICAL CARE IF:  Pain is not effectively controlled with medication.  You feel unable to decrease pain medication over time as planned.  Activity level is not improving as planned and/or expected. SEEK IMMEDIATE MEDICAL CARE IF:  You have increasing pain, vomiting, or are unable to move around at all.  You have numbness, tingling, weakness, or paralysis of any part of your body.  You have loss of normal bowel or bladder control.  You have difficulty breathing, cough, fever, chest or abdominal pain. Document Released: 12/08/2006 Document Revised: 11/15/2011 Document Reviewed: 08/08/2007 [facility] Patient Information 2014 [place], [place].

## 2013-11-08 ENCOUNTER — Ambulatory Visit (INDEPENDENT_AMBULATORY_CARE_PROVIDER_SITE_OTHER): Payer: BC Managed Care – HMO | Admitting: Physician Assistant

## 2013-11-08 ENCOUNTER — Encounter: Payer: Self-pay | Admitting: Physician Assistant

## 2013-11-08 VITALS — BP 140/82 | HR 88 | Temp 98.2°F | Resp 14 | Ht 64.75 in | Wt 246.0 lb

## 2013-11-08 DIAGNOSIS — M545 Low back pain, unspecified: Secondary | ICD-10-CM

## 2013-11-08 MED ORDER — HYDROCODONE-ACETAMINOPHEN 5-325 MG PO TABS: 1.0000 | ORAL_TABLET | Freq: Four times a day (QID) | ORAL | Status: DC | PRN

## 2013-11-08 NOTE — Progress Notes (Signed)
Patient ID: [name] MRN: [id number], DOB: 09-May-1935, 79 y.o. Date of Encounter: 11/08/2013, 2:19 PM    Chief Complaint:  Chief Complaint  Patient presents with  . Hospital F/U    fall with R hip and leg pain, L ankle pain and brusing to lumbar back     HPI: 79 y.o. year old female here for followup of recent ER visit. I have reviewed ER provider note. Patient slipped and fell when going down several steps. She says that she was going out to feed her dog.Slipped on the ice --icy steps. This occurred on 11/04/13. Says that she made it down several of the steps but then slipped and fell down multiple steps. At the time of the ER note her pain was primarily in the right lower back and right chest. Chest x-ray showed no rib fracture. Lumbar spine x-rays showed moderate spondylosis with multilevel disc disease without significant change. Mild anterior wedge compression fracture of L1 slightly worse. She was discharged home. Was prescribed tramadol. Was also to treat with rest and ice.  SHe is here for followup. She says that she definitely did not hit her head. She is on Coumadin. Says tramadol really is not working very well and would like to have something different she's for pain if possible. Complains of pain mostly in the right buttock area.    Home Meds: See attached medication section for any medications that were entered at today's visit. The computer does not put those onto this list.The following list is a list of meds entered prior to today's visit.   Current Outpatient Prescriptions on File Prior to Visit  Medication Sig Dispense Refill  . acetaminophen (TYLENOL) 325 MG tablet Take 650 mg by mouth every 6 (six) hours as needed for mild pain.      . Cholecalciferol (VITAMIN D) 2000 UNITS CAPS Take 1 capsule by mouth daily.      Marland Kitchen diltiazem (CARDIZEM CD) 180 MG 24 hr capsule Take 180 mg by mouth daily.      . furosemide (LASIX) 20 MG tablet Take 1 tablet (20 mg total) by mouth  daily.  90 tablet  1  . isosorbide dinitrate (ISORDIL) 40 MG tablet Take 40 mg by mouth daily.       Marland Kitchen losartan (COZAAR) 100 MG tablet Take 100 mg by mouth daily.      . nitroGLYCERIN (NITROSTAT) 0.4 MG SL tablet Place 1 tablet (0.4 mg total) under the tongue every 5 (five) minutes as needed for chest pain.  50 tablet  3  . omeprazole (PRILOSEC) 20 MG capsule Take 20 mg by mouth daily as needed (heartburn, acid reflux).      Marland Kitchen OVER THE COUNTER MEDICATION Place 1 drop into both eyes daily as needed (dry eyes). Eye drops      . simvastatin (ZOCOR) 10 MG tablet Take 10 mg by mouth at bedtime.      . traMADol (ULTRAM) 50 MG tablet Take 1 tablet (50 mg total) by mouth every 6 (six) hours as needed.  30 tablet  0  . triamcinolone (KENALOG) 0.025 % ointment Apply 1 application topically 2 (two) times daily.  30 g  1  . warfarin (COUMADIN) 1 MG tablet Take 1 mg by mouth See admin instructions. Take half a tablet (0.5 mg) with half a tablet of 5 mg (total 3 mg) on all other days (Not Tuesdays, Thursdays, and Sundays).      . warfarin (COUMADIN) 5 MG tablet  Take 2.5 mg by mouth See admin instructions. Take half a tablet (2.5 mg) on Tuesdays, Thursdays, and Sundays. Take half a tablet with half a tablet of 1 mg (total 3 mg) on all other days.       No current facility-administered medications on file prior to visit.    Allergies:  Allergies  Allergen Reactions  . Contrast Media [Iodinated Diagnostic Agents] Other (See Comments)    Per pt strong burning sensation starting in chest radiating outward  . Statins Other (See Comments)    "bones hurt"  . Celebrex [Celecoxib] Rash  . Tape Itching and Rash    Red Where applied and will spread      Review of Systems: See HPI for pertinent ROS. All other ROS negative.    Physical Exam: Blood pressure 140/82, pulse 88, temperature 98.2 F (36.8 C), resp. rate 14, height 5' 4.75" (1.645 m), weight 246 lb (111.585 kg)., Body mass index is 41.24  kg/(m^2). General: Obese WF. Appears in no acute distress. Lungs: Clear bilaterally to auscultation without wheezes, rales, or rhonchi. Breathing is unlabored. Heart: Regular rhythm. No murmurs, rubs, or gallops. Msk:  Strength and tone normal for age. Skin: Entire Right Buttock is covered with deep purple ecchymosis.  There is small amount of light purple ecchymosis near midline at approx L3 level.  Neuro: Alert and oriented X 3. Moves all extremities spontaneously. Gait is normal. CNII-XII grossly in tact. Psych:  Responds to questions appropriately with a normal affect.     ASSESSMENT AND PLAN:  79 y.o. year old female with  1. Low back pain--s/p fall, contusion She is on Coumadin. Cannot use NSAID. Told her I can give her some hydrocodone as long as she is careful not to fall if this makes her feel woozy. She says that she has used this in the past with no adverse effects. - HYDROcodone-acetaminophen (NORCO/VICODIN) 5-325 MG per tablet; Take 1 tablet by mouth every 6 (six) hours as needed.  Dispense: 40 tablet; Refill: 0   Signed, [address] [place], [place], [facility] 11/08/2013 2:19 PM

## 2013-11-16 ENCOUNTER — Other Ambulatory Visit: Payer: Self-pay | Admitting: Family Medicine

## 2013-11-16 ENCOUNTER — Telehealth: Payer: Self-pay | Admitting: Family Medicine

## 2013-11-16 DIAGNOSIS — I10 Essential (primary) hypertension: Secondary | ICD-10-CM

## 2013-11-16 DIAGNOSIS — I4891 Unspecified atrial fibrillation: Secondary | ICD-10-CM

## 2013-11-16 MED ORDER — WARFARIN SODIUM 5 MG PO TABS: 2.5000 mg | ORAL_TABLET | ORAL | Status: DC

## 2013-11-16 MED ORDER — DILTIAZEM HCL ER COATED BEADS 180 MG PO CP24: 180.0000 mg | ORAL_CAPSULE | Freq: Every day | ORAL | Status: DC

## 2013-11-16 MED ORDER — LOSARTAN POTASSIUM 100 MG PO TABS: 100.0000 mg | ORAL_TABLET | Freq: Every day | ORAL | Status: DC

## 2013-11-16 NOTE — Telephone Encounter (Signed)
Medication refilled per protocol. 

## 2013-11-19 ENCOUNTER — Ambulatory Visit (INDEPENDENT_AMBULATORY_CARE_PROVIDER_SITE_OTHER): Payer: BC Managed Care – HMO | Admitting: Physician Assistant

## 2013-11-19 ENCOUNTER — Encounter: Payer: Self-pay | Admitting: Physician Assistant

## 2013-11-19 VITALS — BP 142/70 | HR 76 | Temp 98.8°F | Resp 18 | Wt 240.0 lb

## 2013-11-19 DIAGNOSIS — J020 Streptococcal pharyngitis: Secondary | ICD-10-CM

## 2013-11-19 DIAGNOSIS — J029 Acute pharyngitis, unspecified: Secondary | ICD-10-CM

## 2013-11-19 LAB — RAPID STREP SCREEN (MED CTR MEBANE ONLY): STREPTOCOCCUS, GROUP A SCREEN (DIRECT): POSITIVE — AB

## 2013-11-19 MED ORDER — AMOXICILLIN-POT CLAVULANATE 500-125 MG PO TABS: 1.0000 | ORAL_TABLET | Freq: Three times a day (TID) | ORAL | Status: DC

## 2013-11-19 NOTE — Progress Notes (Signed)
Patient ID: [name] MRN: [id number], DOB: 1934/11/30, 79 y.o. Date of Encounter: 11/19/2013, 11:48 AM    Chief Complaint:  Chief Complaint  Patient presents with  . c/o sore throat     burning in throat     HPI: 79 y.o. year old white female reports that she just developed sore throat yesterday. Her throat feels extremely sore and raw. Has some phlegm and drainage in her throat. No nasal mucus. No congestion deep in her chest. No fever or chills that she is aware of.     Home Meds: See attached medication section for any medications that were entered at today's visit. The computer does not put those onto this list.The following list is a list of meds entered prior to today's visit.   Current Outpatient Prescriptions on File Prior to Visit  Medication Sig Dispense Refill  . acetaminophen (TYLENOL) 325 MG tablet Take 650 mg by mouth every 6 (six) hours as needed for mild pain.      . Cholecalciferol (VITAMIN D) 2000 UNITS CAPS Take 1 capsule by mouth daily.      Marland Kitchen diltiazem (CARDIZEM CD) 180 MG 24 hr capsule Take 1 capsule (180 mg total) by mouth daily.  90 capsule  3  . furosemide (LASIX) 20 MG tablet Take 1 tablet (20 mg total) by mouth daily.  90 tablet  1  . HYDROcodone-acetaminophen (NORCO/VICODIN) 5-325 MG per tablet Take 1 tablet by mouth every 6 (six) hours as needed.  40 tablet  0  . isosorbide dinitrate (ISORDIL) 40 MG tablet Take 40 mg by mouth daily.       Marland Kitchen losartan (COZAAR) 100 MG tablet Take 1 tablet (100 mg total) by mouth daily.  90 tablet  3  . nitroGLYCERIN (NITROSTAT) 0.4 MG SL tablet Place 1 tablet (0.4 mg total) under the tongue every 5 (five) minutes as needed for chest pain.  50 tablet  3  . omeprazole (PRILOSEC) 20 MG capsule Take 20 mg by mouth daily as needed (heartburn, acid reflux).      Marland Kitchen OVER THE COUNTER MEDICATION Place 1 drop into both eyes daily as needed (dry eyes). Eye drops      . simvastatin (ZOCOR) 10 MG tablet Take 10 mg by mouth at bedtime.       . traMADol (ULTRAM) 50 MG tablet Take 1 tablet (50 mg total) by mouth every 6 (six) hours as needed.  30 tablet  0  . triamcinolone (KENALOG) 0.025 % ointment Apply 1 application topically 2 (two) times daily.  30 g  1  . warfarin (COUMADIN) 1 MG tablet Take 1 mg by mouth See admin instructions. Take half a tablet (0.5 mg) with half a tablet of 5 mg (total 3 mg) on all other days (Not Tuesdays, Thursdays, and Sundays).      . warfarin (COUMADIN) 5 MG tablet Take 0.5 tablets (2.5 mg total) by mouth See admin instructions. Take half a tablet (2.5 mg) on Tuesdays, Thursdays, and Sundays. Take half a tablet with half a tablet of 1 mg (total 3 mg) on all other days.  90 tablet  3   No current facility-administered medications on file prior to visit.    Allergies:  Allergies  Allergen Reactions  . Contrast Media [Iodinated Diagnostic Agents] Other (See Comments)    Per pt strong burning sensation starting in chest radiating outward  . Statins Other (See Comments)    "bones hurt"  . Celebrex [Celecoxib] Rash  .  Tape Itching and Rash    Red Where applied and will spread      Review of Systems: See HPI for pertinent ROS. All other ROS negative.    Physical Exam: Blood pressure 142/70, pulse 76, temperature 98.8 F (37.1 C), temperature source Oral, resp. rate 18, weight 240 lb (108.863 kg), SpO2 96.00%., Body mass index is 40.23 kg/(m^2). General: Obese WF.  Appears in no acute distress. HEENT: Normocephalic, atraumatic, eyes without discharge, sclera non-icteric, nares are without discharge. Bilateral auditory canals clear, TM's are without perforation, pearly grey and translucent with reflective cone of light bilaterally. Oral cavity moist, posterior pharynx with moderate erythema but with no exudate,  peritonsillar abscess. Neck: Supple. No thyromegaly. Bilateral anterior cervical nodes are slightly tender but not enlarged. Lungs: Clear bilaterally to auscultation without wheezes, rales,  or rhonchi. Breathing is unlabored. Heart: Irreg. Msk:  Strength and tone normal for age. Extremities/Skin: Warm and dry.. No rashes . Neuro: Alert and oriented X 3. Moves all extremities spontaneously. Gait is normal. CNII-XII grossly in tact. Psych:  Responds to questions appropriately with a normal affect.   Strep Test is positive.   ASSESSMENT AND PLAN:  79 y.o. year old female with  1. Strep pharyngitis Rapid strep test is positive. Told her to make sure to complete all 10 days' worth of antibiotics. She can use lozenges and spray as needed to help with the pain. Followup with me as needed. - amoxicillin-clavulanate (AUGMENTIN) 500-125 MG per tablet; Take 1 tablet (500 mg total) by mouth 3 (three) times daily.  Dispense: 30 tablet; Refill: 0  2. Sorethroat - Rapid Strep Screen   Signed, Olean Ree [place], [place], [facility] 11/19/2013 11:48 AM

## 2013-11-20 ENCOUNTER — Telehealth: Payer: Self-pay | Admitting: Family Medicine

## 2013-11-20 MED ORDER — GUAIFENESIN-CODEINE 100-10 MG/5ML PO SOLN: 5.0000 mL | Freq: Three times a day (TID) | ORAL | Status: DC | PRN

## 2013-11-20 NOTE — Telephone Encounter (Signed)
Seen yesterday for Strep Throat.  Needs something for bad cough, is causing pain along left side of rib cage with coughing.  Had her up all night.

## 2013-11-20 NOTE — Telephone Encounter (Signed)
RX printed for provider and patient knows to come pick up.

## 2013-11-20 NOTE — Telephone Encounter (Signed)
Robitussin with codeine, 1 tsp po q 8 hrs prn cough

## 2013-11-21 ENCOUNTER — Other Ambulatory Visit: Payer: Self-pay | Admitting: Family Medicine

## 2013-11-21 DIAGNOSIS — I10 Essential (primary) hypertension: Secondary | ICD-10-CM

## 2013-11-21 MED ORDER — DILTIAZEM HCL ER COATED BEADS 180 MG PO CP24: 180.0000 mg | ORAL_CAPSULE | Freq: Every day | ORAL | Status: DC

## 2013-11-22 ENCOUNTER — Encounter: Payer: Self-pay | Admitting: Physician Assistant

## 2013-11-22 ENCOUNTER — Ambulatory Visit (INDEPENDENT_AMBULATORY_CARE_PROVIDER_SITE_OTHER): Payer: BC Managed Care – HMO | Admitting: Physician Assistant

## 2013-11-22 VITALS — BP 166/76 | HR 84 | Temp 98.4°F | Resp 22 | Wt 237.0 lb

## 2013-11-22 DIAGNOSIS — J22 Unspecified acute lower respiratory infection: Secondary | ICD-10-CM

## 2013-11-22 DIAGNOSIS — J988 Other specified respiratory disorders: Secondary | ICD-10-CM

## 2013-11-22 DIAGNOSIS — J020 Streptococcal pharyngitis: Secondary | ICD-10-CM

## 2013-11-22 MED ORDER — AZITHROMYCIN 500 MG PO TABS: 500.0000 mg | ORAL_TABLET | Freq: Every day | ORAL | Status: DC

## 2013-11-22 NOTE — Progress Notes (Signed)
Patient ID: [name] MRN: [id number], DOB: December 12, 1934, 79 y.o. Date of Encounter: 11/22/2013, 1:22 PM    Chief Complaint:  Chief Complaint  Patient presents with  . not getting better    trouble breathing/wheezing, chest wall pain     HPI: 79 y.o. year old white female who had recent office visit with me on 11/19/13. At that time she reported that just the day prior to that visit, she had just  developed sore throat. Said that her throat felt very sore and raw. Said that she had some phlegm and drainage in her throat. No nasal mucus. No congestion in her chest. No fevers or chills. At that visit rapid strep test was positive. She was treated with Augmentin 500+125   one 3 times a day for 10 days.  Since that visit there is a phone message documenting a phone call by pt,  requesting some cough medication. Guaifenesin/codeine was prescribed.  She comes in today reporting that she is having increasing congestion in her chest now. Says that the last several nights when she would try to lie in her bed to sleep she can hear it "rattling" in her chest. Ends up having to go to her recliner where she was able to sleep better. Says that she is having a lot of cough now. She is getting up a lot of phlegm which is yellow green.  Still no mucus from the nose. Still no fevers or chills. Also complaining of soreness around her back and her chest area.     Home Meds: See attached medication section for any medications that were entered at today's visit. The computer does not put those onto this list.The following list is a list of meds entered prior to today's visit.   Current Outpatient Prescriptions on File Prior to Visit  Medication Sig Dispense Refill  . acetaminophen (TYLENOL) 325 MG tablet Take 650 mg by mouth every 6 (six) hours as needed for mild pain.      Marland Kitchen amoxicillin-clavulanate (AUGMENTIN) 500-125 MG per tablet Take 1 tablet (500 mg total) by mouth 3 (three) times daily.  30 tablet  0  .  Cholecalciferol (VITAMIN D) 2000 UNITS CAPS Take 1 capsule by mouth daily.      Marland Kitchen diltiazem (CARDIZEM CD) 180 MG 24 hr capsule Take 1 capsule (180 mg total) by mouth daily.  90 capsule  3  . furosemide (LASIX) 20 MG tablet Take 1 tablet (20 mg total) by mouth daily.  90 tablet  1  . guaiFENesin-codeine 100-10 MG/5ML syrup Take 5 mLs by mouth every 8 (eight) hours as needed for cough.  120 mL  0  . HYDROcodone-acetaminophen (NORCO/VICODIN) 5-325 MG per tablet Take 1 tablet by mouth every 6 (six) hours as needed.  40 tablet  0  . isosorbide dinitrate (ISORDIL) 40 MG tablet Take 40 mg by mouth daily.       Marland Kitchen losartan (COZAAR) 100 MG tablet Take 1 tablet (100 mg total) by mouth daily.  90 tablet  3  . nitroGLYCERIN (NITROSTAT) 0.4 MG SL tablet Place 1 tablet (0.4 mg total) under the tongue every 5 (five) minutes as needed for chest pain.  50 tablet  3  . omeprazole (PRILOSEC) 20 MG capsule Take 20 mg by mouth daily as needed (heartburn, acid reflux).      Marland Kitchen OVER THE COUNTER MEDICATION Place 1 drop into both eyes daily as needed (dry eyes). Eye drops      . simvastatin (  ZOCOR) 10 MG tablet Take 10 mg by mouth at bedtime.      . traMADol (ULTRAM) 50 MG tablet Take 1 tablet (50 mg total) by mouth every 6 (six) hours as needed.  30 tablet  0  . triamcinolone (KENALOG) 0.025 % ointment Apply 1 application topically 2 (two) times daily.  30 g  1  . warfarin (COUMADIN) 1 MG tablet Take 1 mg by mouth See admin instructions. Take half a tablet (0.5 mg) with half a tablet of 5 mg (total 3 mg) on all other days (Not Tuesdays, Thursdays, and Sundays).      . warfarin (COUMADIN) 5 MG tablet Take 0.5 tablets (2.5 mg total) by mouth See admin instructions. Take half a tablet (2.5 mg) on Tuesdays, Thursdays, and Sundays. Take half a tablet with half a tablet of 1 mg (total 3 mg) on all other days.  90 tablet  3   No current facility-administered medications on file prior to visit.    Allergies:  Allergies    Allergen Reactions  . Contrast Media [Iodinated Diagnostic Agents] Other (See Comments)    Per pt strong burning sensation starting in chest radiating outward  . Statins Other (See Comments)    "bones hurt"  . Celebrex [Celecoxib] Rash  . Tape Itching and Rash    Red Where applied and will spread      Review of Systems: See HPI for pertinent ROS. All other ROS negative.    Physical Exam: Blood pressure 166/76, pulse 84, temperature 98.4 F (36.9 C), temperature source Oral, resp. rate 22, weight 237 lb (107.502 kg), SpO2 93.00%., Body mass index is 39.73 kg/(m^2). General: Obese WF.  Appears in no acute distress. HEENT: Normocephalic, atraumatic, eyes without discharge, sclera non-icteric, nares are without discharge. Bilateral auditory canals clear, TM's are without perforation, pearly grey and translucent with reflective cone of light bilaterally. Oral cavity moist, posterior pharynx without exudate, peritonsillar abscess. Mild erythema.   Neck: Supple. No thyromegaly. No lymphadenopathy. Lungs: Breath sounds are distant secondary to her obesity. However they are clear. I hear no wheezes no rhonchi no rales. Heart: Irreg. Msk: She is severely tender when I palpate her chest wall along the border of her sternum bilaterally as well as under her breasts.  Strength and tone normal for age. Extremities/Skin: Warm and dry.  No rashes. Neuro: Alert and oriented X 3. Moves all extremities spontaneously. Gait is normal. CNII-XII grossly in tact. Psych:  Responds to questions appropriately with a normal affect.     ASSESSMENT AND PLAN:  79 y.o. year old female with  1. Strep pharyngitis - azithromycin (ZITHROMAX) 500 MG tablet; Take 1 tablet (500 mg total) by mouth daily.  Dispense: 5 tablet; Refill: 0  2. Lower resp. tract infection - azithromycin (ZITHROMAX) 500 MG tablet; Take 1 tablet (500 mg total) by mouth daily.  Dispense: 5 tablet; Refill: 0   stop the Augmentin. Change to  the azithromycin as above. Give this dose for adequate coverage for strep. Continue the guaifenesin/codeine cough syrup. Re- assured her that the pain she is feeling is musculoskeletal. F/U if  symptoms worsen or do not resolve within a week after completing antibiotics.  Signed, [address] [place], [place], [facility] 11/22/2013 1:22 PM

## 2013-11-23 ENCOUNTER — Other Ambulatory Visit: Payer: Self-pay

## 2013-11-23 MED ORDER — SIMVASTATIN 10 MG PO TABS: 10.0000 mg | ORAL_TABLET | Freq: Every day | ORAL | Status: DC

## 2013-12-12 ENCOUNTER — Telehealth: Payer: Self-pay | Admitting: Cardiology

## 2013-12-12 NOTE — Telephone Encounter (Signed)
New message         Pt would like to come in on 4/20 while her husband is here seeing dr Tamala Julian. Can you work this pt in?

## 2013-12-12 NOTE — Telephone Encounter (Signed)
Scheduled pt for June April was completely booked for Dr [name].

## 2013-12-20 ENCOUNTER — Encounter: Payer: Self-pay | Admitting: Physician Assistant

## 2013-12-20 ENCOUNTER — Ambulatory Visit (INDEPENDENT_AMBULATORY_CARE_PROVIDER_SITE_OTHER): Payer: BC Managed Care – HMO | Admitting: Physician Assistant

## 2013-12-20 ENCOUNTER — Telehealth: Payer: Self-pay | Admitting: Family Medicine

## 2013-12-20 VITALS — BP 134/86 | HR 64 | Temp 98.3°F | Resp 18 | Wt 229.0 lb

## 2013-12-20 DIAGNOSIS — Z7901 Long term (current) use of anticoagulants: Secondary | ICD-10-CM

## 2013-12-20 DIAGNOSIS — I482 Chronic atrial fibrillation, unspecified: Secondary | ICD-10-CM

## 2013-12-20 DIAGNOSIS — A499 Bacterial infection, unspecified: Secondary | ICD-10-CM

## 2013-12-20 DIAGNOSIS — B9689 Other specified bacterial agents as the cause of diseases classified elsewhere: Secondary | ICD-10-CM

## 2013-12-20 DIAGNOSIS — I4891 Unspecified atrial fibrillation: Secondary | ICD-10-CM

## 2013-12-20 DIAGNOSIS — J988 Other specified respiratory disorders: Secondary | ICD-10-CM

## 2013-12-20 LAB — PT WITH INR/FINGERSTICK
INR FINGERSTICK: 2.5 — AB (ref 0.80–1.20)
PT FINGERSTICK: 29.6 s — AB (ref 10.4–12.5)

## 2013-12-20 MED ORDER — AZITHROMYCIN 250 MG PO TABS: ORAL_TABLET | ORAL | Status: DC

## 2013-12-20 NOTE — Telephone Encounter (Signed)
Pt sick.  Given appt for later today

## 2013-12-20 NOTE — Progress Notes (Signed)
Patient ID: [name] MRN: [id number], DOB: May 03, 1935, 79 y.o. Date of Encounter: 12/20/2013, 1:49 PM    Chief Complaint:  Chief Complaint  Patient presents with  . chest/head congestion    choking cough  . PT/INR check     HPI: 79 y.o. year old female has recently seen me for some other illnesses. 11/19/13 had positive strep test. Initially prescribed Augmentin. However she was seen again on 11/22/13 complaining of increased chest congestion. Antibiotic was changed to azithromycin to cover both strep and  lower respiratory infection. Patient states that all of those symptoms had completely resolved. She states that 7 days ago her nephew was there and he "was coughing all over the place."  She says that was on Thursday. Says that Sunday night her throat began to burn. Since then she has developed some mucus in her nose. Coughing up a lot of phlegm. Has no sore throat now. No ear ache. No known fevers or chills. Says the azithromycin worked well for her before and is requesting to do try that again if possible.    Home Meds: See attached medication section for any medications that were entered at today's visit. The computer does not put those onto this list.The following list is a list of meds entered prior to today's visit.   Current Outpatient Prescriptions on File Prior to Visit  Medication Sig Dispense Refill  . acetaminophen (TYLENOL) 325 MG tablet Take 650 mg by mouth every 6 (six) hours as needed for mild pain.      . Cholecalciferol (VITAMIN D) 2000 UNITS CAPS Take 1 capsule by mouth daily.      Marland Kitchen diltiazem (CARDIZEM CD) 180 MG 24 hr capsule Take 1 capsule (180 mg total) by mouth daily.  90 capsule  3  . furosemide (LASIX) 20 MG tablet Take 1 tablet (20 mg total) by mouth daily.  90 tablet  1  . guaiFENesin-codeine 100-10 MG/5ML syrup Take 5 mLs by mouth every 8 (eight) hours as needed for cough.  120 mL  0  . HYDROcodone-acetaminophen (NORCO/VICODIN) 5-325 MG per tablet Take 1  tablet by mouth every 6 (six) hours as needed.  40 tablet  0  . isosorbide dinitrate (ISORDIL) 40 MG tablet Take 40 mg by mouth daily.       Marland Kitchen losartan (COZAAR) 100 MG tablet Take 1 tablet (100 mg total) by mouth daily.  90 tablet  3  . nitroGLYCERIN (NITROSTAT) 0.4 MG SL tablet Place 1 tablet (0.4 mg total) under the tongue every 5 (five) minutes as needed for chest pain.  50 tablet  3  . omeprazole (PRILOSEC) 20 MG capsule Take 20 mg by mouth daily as needed (heartburn, acid reflux).      Marland Kitchen OVER THE COUNTER MEDICATION Place 1 drop into both eyes daily as needed (dry eyes). Eye drops      . simvastatin (ZOCOR) 10 MG tablet Take 1 tablet (10 mg total) by mouth at bedtime.  90 tablet  0  . traMADol (ULTRAM) 50 MG tablet Take 1 tablet (50 mg total) by mouth every 6 (six) hours as needed.  30 tablet  0  . triamcinolone (KENALOG) 0.025 % ointment Apply 1 application topically 2 (two) times daily.  30 g  1  . warfarin (COUMADIN) 1 MG tablet Take 1 mg by mouth See admin instructions. Take half a tablet (0.5 mg) with half a tablet of 5 mg (total 3 mg) on all other days (Not Tuesdays, Thursdays,  and Sundays).      . warfarin (COUMADIN) 5 MG tablet Take 0.5 tablets (2.5 mg total) by mouth See admin instructions. Take half a tablet (2.5 mg) on Tuesdays, Thursdays, and Sundays. Take half a tablet with half a tablet of 1 mg (total 3 mg) on all other days.  90 tablet  3   No current facility-administered medications on file prior to visit.    Allergies:  Allergies  Allergen Reactions  . Contrast Media [Iodinated Diagnostic Agents] Other (See Comments)    Per pt strong burning sensation starting in chest radiating outward  . Statins Other (See Comments)    "bones hurt"  . Celebrex [Celecoxib] Rash  . Tape Itching and Rash    Red Where applied and will spread      Review of Systems: See HPI for pertinent ROS. All other ROS negative.    Physical Exam: Blood pressure 134/86, pulse 64, temperature  98.3 F (36.8 C), temperature source Oral, resp. rate 18, weight 229 lb (103.874 kg)., Body mass index is 38.39 kg/(m^2). General: Obese WF.  Appears in no acute distress. HEENT: Normocephalic, atraumatic, eyes without discharge, sclera non-icteric, nares are without discharge. Bilateral auditory canals clear, TM's are without perforation, pearly grey and translucent with reflective cone of light bilaterally. Oral cavity moist, posterior pharynx without exudate, erythema, peritonsillar abscess. No tenderness with percussion of frontal or maxillary sinuses bilaterally.  Neck: Supple. No thyromegaly. No lymphadenopathy. Lungs: Clear bilaterally to auscultation without wheezes, rales, or rhonchi. Breathing is unlabored. Heart: Irregular. Msk:  Strength and tone normal for age. Extremities/Skin: Warm and dry. Neuro: Alert and oriented X 3. Moves all extremities spontaneously. Gait is normal. CNII-XII grossly in tact. Psych:  Responds to questions appropriately with a normal affect.   Results for orders placed in visit on 12/20/13  PT WITH INR/FINGERSTICK      Result Value Ref Range   PT, fingerstick 29.6 (*) 10.4 - 12.5 seconds   INR, fingerstick 2.5 (*) 0.80 - 1.20     ASSESSMENT AND PLAN:  79 y.o. year old female with  1. Bacterial respiratory infection - azithromycin (ZITHROMAX) 250 MG tablet; Day 1: Take 2 daily.  Days 2-5: Take 1 daily.  Dispense: 6 tablet; Refill: 0 F/U if  symptoms worsen significantly or if symptoms do not resolve within one week after completion of antibiotic. Recommend also use Mucinex DM or some other type of expectorant.  2. Long term (current) use of anticoagulants - PT with INR/Fingerstick Continue current dose of Coumadin. She takes 2.5 mg on Sundays Tuesdays and Thursdays. 3 mg all other days. ReCheck 4 weeks.  3. Chronic atrial fibrillation  4. Chronic anticoagulation   Signed, Olean Ree [place], [place], [facility] 12/20/2013 1:49 PM

## 2014-01-09 ENCOUNTER — Telehealth: Payer: Self-pay | Admitting: Family Medicine

## 2014-01-09 MED ORDER — FUROSEMIDE 20 MG PO TABS: 20.0000 mg | ORAL_TABLET | Freq: Every day | ORAL | Status: DC

## 2014-01-09 NOTE — Telephone Encounter (Signed)
Medication refilled per protocol. 

## 2014-01-11 ENCOUNTER — Other Ambulatory Visit: Payer: Self-pay | Admitting: Family Medicine

## 2014-01-11 MED ORDER — FUROSEMIDE 20 MG PO TABS: 20.0000 mg | ORAL_TABLET | Freq: Every day | ORAL | Status: DC

## 2014-01-16 ENCOUNTER — Other Ambulatory Visit: Payer: Self-pay

## 2014-01-16 MED ORDER — SIMVASTATIN 10 MG PO TABS: 10.0000 mg | ORAL_TABLET | Freq: Every day | ORAL | Status: DC

## 2014-02-06 ENCOUNTER — Other Ambulatory Visit: Payer: Self-pay

## 2014-02-06 MED ORDER — SIMVASTATIN 10 MG PO TABS: 10.0000 mg | ORAL_TABLET | Freq: Every day | ORAL | Status: DC

## 2014-02-15 ENCOUNTER — Encounter: Payer: Self-pay | Admitting: Cardiology

## 2014-02-15 ENCOUNTER — Other Ambulatory Visit: Payer: Self-pay | Admitting: *Deleted

## 2014-02-15 ENCOUNTER — Ambulatory Visit (INDEPENDENT_AMBULATORY_CARE_PROVIDER_SITE_OTHER): Payer: Medicare Other | Admitting: Cardiology

## 2014-02-15 VITALS — BP 142/60 | HR 64 | Ht 64.75 in | Wt 231.0 lb

## 2014-02-15 DIAGNOSIS — E785 Hyperlipidemia, unspecified: Secondary | ICD-10-CM

## 2014-02-15 DIAGNOSIS — Z7901 Long term (current) use of anticoagulants: Secondary | ICD-10-CM

## 2014-02-15 DIAGNOSIS — I4891 Unspecified atrial fibrillation: Secondary | ICD-10-CM

## 2014-02-15 DIAGNOSIS — R011 Cardiac murmur, unspecified: Secondary | ICD-10-CM

## 2014-02-15 DIAGNOSIS — I5032 Chronic diastolic (congestive) heart failure: Secondary | ICD-10-CM

## 2014-02-15 DIAGNOSIS — I482 Chronic atrial fibrillation, unspecified: Secondary | ICD-10-CM

## 2014-02-15 DIAGNOSIS — I10 Essential (primary) hypertension: Secondary | ICD-10-CM

## 2014-02-15 DIAGNOSIS — I2510 Atherosclerotic heart disease of native coronary artery without angina pectoris: Secondary | ICD-10-CM

## 2014-02-15 LAB — BASIC METABOLIC PANEL
BUN: 9 mg/dL (ref 6–23)
CO2: 28 mEq/L (ref 19–32)
Calcium: 9.3 mg/dL (ref 8.4–10.5)
Chloride: 104 mEq/L (ref 96–112)
Creatinine, Ser: 0.8 mg/dL (ref 0.4–1.2)
GFR: 79.6 mL/min (ref >60.00)
GLUCOSE: 146 mg/dL — AB (ref 70–99)
POTASSIUM: 4 meq/L (ref 3.5–5.1)
Sodium: 138 mEq/L (ref 135–145)

## 2014-02-15 MED ORDER — ISOSORBIDE DINITRATE 40 MG PO TABS: 40.0000 mg | ORAL_TABLET | Freq: Two times a day (BID) | ORAL | Status: DC

## 2014-02-15 NOTE — Patient Instructions (Signed)
Will obtain labs today and call you with the results (bmet)  Your physician has requested that you have an echocardiogram. Echocardiography is a painless test that uses sound waves to create images of your heart. It provides your doctor with information about the size and shape of your heart and how well your heart's chambers and valves are working. This procedure takes approximately one hour. There are no restrictions for this procedure.  Your physician recommends that you continue on your current medications as directed. Please refer to the Current Medication list given to you today.  Your physician wants you to follow-up in: 6 month ov You will receive a reminder letter in the mail two months in advance. If you don't receive a letter, please call our office to schedule the follow-up appointment.

## 2014-02-15 NOTE — Progress Notes (Signed)
[facility], [address] [place], [place]  [address] Phone: [phone] Fax:  [phone]  Date:  02/15/2014   ID:  [name], DOB 08-Dec-1934, MRN [id number]  PCP:  Karis Juba, PA-C  Cardiologist:  Fransico Him, MD     HPI:  [name] is a 79 y.o. female with a history of CAD, HTN, dyslipidemia and PAF. She is doing well. She denies any dizziness, palpitations or syncope. She was seen in the ER in December with some right sided chest pain and cardiac enzymes were normal. She was diagnosed with mild acute on chronic diastolic CHF and was given some extra lasix in the ER.   Since then she has not had any further CP.  She has chronic SOB which she thinks is stable. She has chronic LE edema which is stable.     Wt Readings from Last 3 Encounters:  02/15/14 231 lb (104.781 kg)  12/20/13 229 lb (103.874 kg)  11/22/13 237 lb (107.502 kg)     Past Medical History  Diagnosis Date  . Cancer     right colon and left breast  . Breast cancer 01/06/2012  . Colon cancer 01/06/2012  . Hernia   . GERD (gastroesophageal reflux disease)   . Hypertension   . Allergy     rhinitis  . Osteopenia   . Diverticulosis   . Pneumonia     hx child  . Hiatal hernia     denies  . Arthritis   . Coronary artery disease 2006    nonobstructive with 20-30% ostial LAD and LM  . Atrial fibrillation     chronic atrial fibrillation  . Edema extremities   . Hyperlipidemia   . COPD (chronic obstructive pulmonary disease)     Current Outpatient Prescriptions  Medication Sig Dispense Refill  . acetaminophen (TYLENOL) 325 MG tablet Take 650 mg by mouth every 6 (six) hours as needed for mild pain.      . Cholecalciferol (VITAMIN D) 2000 UNITS CAPS Take 1 capsule by mouth daily.      Marland Kitchen diltiazem (CARDIZEM CD) 180 MG 24 hr capsule Take 1 capsule (180 mg total) by mouth daily.  90 capsule  3  . furosemide (LASIX) 20 MG tablet Take 1 tablet (20 mg total) by mouth daily.  90 tablet  1  . isosorbide dinitrate  (ISORDIL) 40 MG tablet Take 40 mg by mouth 2 (two) times daily.       Marland Kitchen losartan (COZAAR) 100 MG tablet Take 1 tablet (100 mg total) by mouth daily.  90 tablet  3  . nitroGLYCERIN (NITROSTAT) 0.4 MG SL tablet Place 1 tablet (0.4 mg total) under the tongue every 5 (five) minutes as needed for chest pain.  50 tablet  3  . omeprazole (PRILOSEC) 20 MG capsule Take 20 mg by mouth daily as needed (heartburn, acid reflux).      Marland Kitchen OVER THE COUNTER MEDICATION Place 1 drop into both eyes daily as needed (dry eyes). Eye drops      . simvastatin (ZOCOR) 10 MG tablet Take 1 tablet (10 mg total) by mouth at bedtime.  15 tablet  0  . triamcinolone (KENALOG) 0.025 % ointment Apply 1 application topically 2 (two) times daily.  30 g  1  . warfarin (COUMADIN) 1 MG tablet Take 1 mg by mouth See admin instructions. Take half a tablet (0.5 mg) with half a tablet of 5 mg (total 3 mg) on all other days (Not Tuesdays,  Thursdays, and Sundays).      . warfarin (COUMADIN) 5 MG tablet Take 0.5 tablets (2.5 mg total) by mouth See admin instructions. Take half a tablet (2.5 mg) on Tuesdays, Thursdays, and Sundays. Take half a tablet with half a tablet of 1 mg (total 3 mg) on all other days.  90 tablet  3   No current facility-administered medications for this visit.    Allergies:    Allergies  Allergen Reactions  . Contrast Media [Iodinated Diagnostic Agents] Other (See Comments)    Per pt strong burning sensation starting in chest radiating outward  . Statins Other (See Comments)    "bones hurt"  . Celebrex [Celecoxib] Rash  . Tape Itching and Rash    Red Where applied and will spread    Social History:  The patient  reports that she has never smoked. She has never used smokeless tobacco. She reports that she does not drink alcohol or use illicit drugs.   Family History:  The patient's family history includes Cancer in her sister and sister; Heart disease in her mother; Heart disease (age of onset: 59) in her brother.    ROS:  Please see the history of present illness.      All other systems reviewed and negative.   PHYSICAL EXAM: VS:  BP 142/60  Pulse 64  Ht 5' 4.75" (1.645 m)  Wt 231 lb (104.781 kg)  BMI 38.72 kg/m2 Well nourished, well developed, in no acute distress HEENT: normal Neck: no JVD Cardiac:  normal S1, S2;irregularly irregular; 2/6 SM at RUSB Lungs:  clear to auscultation bilaterally, no wheezing, rhonchi or rales Abd: soft, nontender, no hepatomegaly Ext: trace edema Skin: warm and dry Neuro:  CNs 2-12 intact, no focal abnormalities noted   ASSESSMENT AND PLAN:  1. ASCAD - Continue Isosorbide and  ASA  2. HTN - continue Cardizem/Cozaar  3. Dyslipidemia - LDL at goal 10/2013 - continue simvastatin  4. Chronic atrial fibrillation - continue warfarin  5. Chronic anticoagulation 6. Chronic diastolic CHF - appears compensated - continue Lasix/Cardizem - check BMET today 7.  Heart murmur - 2D echo to assess  Followup with me in 6 months   Signed, Fransico Him, MD 02/15/2014 10:23 AM

## 2014-02-18 ENCOUNTER — Encounter: Payer: Self-pay | Admitting: General Surgery

## 2014-03-04 ENCOUNTER — Telehealth: Payer: Self-pay | Admitting: Cardiology

## 2014-03-04 MED ORDER — SIMVASTATIN 10 MG PO TABS: 10.0000 mg | ORAL_TABLET | Freq: Every day | ORAL | Status: DC

## 2014-03-04 NOTE — Telephone Encounter (Signed)
rx sent in for pt.  

## 2014-03-04 NOTE — Telephone Encounter (Signed)
°  Patient has questions regarding her medication, please call and advise.

## 2014-03-11 ENCOUNTER — Ambulatory Visit (HOSPITAL_COMMUNITY): Payer: Medicare Other | Attending: Cardiology | Admitting: Radiology

## 2014-03-11 DIAGNOSIS — I482 Chronic atrial fibrillation, unspecified: Secondary | ICD-10-CM

## 2014-03-11 DIAGNOSIS — R011 Cardiac murmur, unspecified: Secondary | ICD-10-CM | POA: Insufficient documentation

## 2014-03-11 DIAGNOSIS — I4891 Unspecified atrial fibrillation: Secondary | ICD-10-CM

## 2014-03-11 DIAGNOSIS — R0602 Shortness of breath: Secondary | ICD-10-CM | POA: Insufficient documentation

## 2014-03-11 DIAGNOSIS — I5032 Chronic diastolic (congestive) heart failure: Secondary | ICD-10-CM

## 2014-03-11 DIAGNOSIS — I509 Heart failure, unspecified: Secondary | ICD-10-CM

## 2014-03-11 NOTE — Progress Notes (Signed)
Echocardiogram performed.  

## 2014-03-14 ENCOUNTER — Telehealth: Payer: Self-pay | Admitting: General Surgery

## 2014-03-14 ENCOUNTER — Other Ambulatory Visit: Payer: Self-pay | Admitting: General Surgery

## 2014-03-14 ENCOUNTER — Ambulatory Visit (INDEPENDENT_AMBULATORY_CARE_PROVIDER_SITE_OTHER): Payer: BC Managed Care – HMO | Admitting: Physician Assistant

## 2014-03-14 ENCOUNTER — Encounter: Payer: Self-pay | Admitting: Physician Assistant

## 2014-03-14 VITALS — BP 138/72 | HR 62 | Temp 98.1°F | Resp 14 | Ht 65.0 in | Wt 231.0 lb

## 2014-03-14 DIAGNOSIS — Z23 Encounter for immunization: Secondary | ICD-10-CM

## 2014-03-14 DIAGNOSIS — I482 Chronic atrial fibrillation, unspecified: Secondary | ICD-10-CM

## 2014-03-14 DIAGNOSIS — Z7901 Long term (current) use of anticoagulants: Secondary | ICD-10-CM

## 2014-03-14 DIAGNOSIS — Z0000 Encounter for general adult medical examination without abnormal findings: Secondary | ICD-10-CM

## 2014-03-14 DIAGNOSIS — I4891 Unspecified atrial fibrillation: Secondary | ICD-10-CM

## 2014-03-14 DIAGNOSIS — R739 Hyperglycemia, unspecified: Secondary | ICD-10-CM

## 2014-03-14 DIAGNOSIS — R7309 Other abnormal glucose: Secondary | ICD-10-CM

## 2014-03-14 DIAGNOSIS — I272 Pulmonary hypertension, unspecified: Secondary | ICD-10-CM

## 2014-03-14 LAB — PT WITH INR/FINGERSTICK
INR, fingerstick: 2.7 — ABNORMAL HIGH (ref 0.80–1.20)
PT FINGERSTICK: 32.4 s — AB (ref 10.4–12.5)

## 2014-03-14 LAB — HEMOGLOBIN A1C, FINGERSTICK: Hgb A1C (fingerstick): 5.9 % — ABNORMAL HIGH (ref <5.7)

## 2014-03-14 MED ORDER — DIPHENHYDRAMINE HCL 25 MG PO CAPS: ORAL_CAPSULE | ORAL | Status: DC

## 2014-03-14 MED ORDER — FAMOTIDINE 20 MG PO TABS: ORAL_TABLET | ORAL | Status: DC

## 2014-03-14 MED ORDER — PREDNISONE 20 MG PO TABS: ORAL_TABLET | ORAL | Status: DC

## 2014-03-14 NOTE — Addendum Note (Signed)
Addended by: Sheral Flow on: 03/14/2014 10:10 AM   Modules accepted: Orders

## 2014-03-14 NOTE — Telephone Encounter (Signed)
Meds sent into pharmacy. Will call and explain to pt how to take meds prior to CT once CT is scheduled.

## 2014-03-14 NOTE — Progress Notes (Signed)
Patient ID: [name] MRN: [id number], DOB: [birth year], 79 y.o. Date of Encounter: 03/14/2014,   Chief Complaint: Physical (CPE)  HPI: 79 y.o. y/o female  here for CPE.  Says her insurance company keeps calling,  telling her she needs to come in for a complete physical checkup.  She sees the following medical providers on a routine basis  [facility] Chancy Milroy at the [facility]--- she goes there every 6 months  Dr. Golden Hurter [facility]--- she goes there every 6 months. Says she just had an echo on Monday by Dr. [name].  Dr. Cristina Gong for GI--says she is to followup with him in 5 years   Review of Systems: Consitutional: No fever, chills, fatigue, night sweats, lymphadenopathy. No significant/unexplained weight changes. Eyes: No visual changes, eye redness, or discharge. ENT/Mouth: No ear pain, sore throat, nasal drainage, or sinus pain. Cardiovascular: No chest pressure,heaviness, tightness or squeezing, even with exertion. No increased shortness of breath or dyspnea on exertion.No palpitations, edema, orthopnea, PND. Respiratory: No cough, hemoptysis, SOB, or wheezing. Gastrointestinal: No anorexia, dysphagia, reflux, pain, nausea, vomiting, hematemesis, diarrhea, constipation, BRBPR, or melena. Breast: No mass, nodules, bulging, or retraction. No skin changes or inflammation. No nipple discharge. No lymphadenopathy. Genitourinary: No dysuria, hematuria, incontinence, vaginal discharge, pruritis, burning, abnormal bleeding, or pain. Musculoskeletal: No decreased ROM, No joint pain or swelling. No significant pain in neck, back, or extremities. Skin: No rash, pruritis, or concerning lesions. Neurological: No headache, dizziness, syncope, seizures, tremors, memory loss, coordination problems, or paresthesias. Psychological: No anxiety, depression, hallucinations, SI/HI. Endocrine: No polydipsia, polyphagia, polyuria, or known diabetes.No increased fatigue. No  palpitations/rapid heart rate. No significant/unexplained weight change. All other systems were reviewed and are otherwise negative.  Past Medical History  Diagnosis Date  . Cancer     right colon and left breast  . Breast cancer 01/06/2012  . Colon cancer 01/06/2012  . Hernia   . GERD (gastroesophageal reflux disease)   . Hypertension   . Allergy     rhinitis  . Osteopenia   . Diverticulosis   . Pneumonia     hx child  . Hiatal hernia     denies  . Arthritis   . Coronary artery disease 2006    nonobstructive with 20-30% ostial LAD and LM  . Atrial fibrillation     chronic atrial fibrillation  . Edema extremities   . Hyperlipidemia   . COPD (chronic obstructive pulmonary disease)      Past Surgical History  Procedure Laterality Date  . Colectomy      right side  . Mastectomy partial / lumpectomy  2008    left breast  . Cardiac catheterization    . Breast surgery      lumpectomy left  . Eye surgery Bilateral 12    cataracts  . Appendectomy    . Cholecystectomy    . Excision of accessory nipple Bilateral 05/30/2013    Procedure: BILATERAL NIPPLE BIOPSY;  Surgeon: Merrie Roof, MD;  Location: [facility];  Service: General;  Laterality: Bilateral;    Home Meds:  Outpatient Prescriptions Prior to Visit  Medication Sig Dispense Refill  . acetaminophen (TYLENOL) 325 MG tablet Take 650 mg by mouth every 6 (six) hours as needed for mild pain.      . Cholecalciferol (VITAMIN D) 2000 UNITS CAPS Take 1 capsule by mouth daily.      Marland Kitchen diltiazem (CARDIZEM CD) 180 MG 24 hr capsule Take 1 capsule (180  mg total) by mouth daily.  90 capsule  3  . furosemide (LASIX) 20 MG tablet Take 1 tablet (20 mg total) by mouth daily.  90 tablet  1  . isosorbide dinitrate (ISORDIL) 40 MG tablet Take 1 tablet (40 mg total) by mouth 2 (two) times daily.  180 tablet  1  . losartan (COZAAR) 100 MG tablet Take 1 tablet (100 mg total) by mouth daily.  90 tablet  3  . nitroGLYCERIN (NITROSTAT) 0.4 MG SL  tablet Place 1 tablet (0.4 mg total) under the tongue every 5 (five) minutes as needed for chest pain.  50 tablet  3  . omeprazole (PRILOSEC) 20 MG capsule Take 20 mg by mouth daily as needed (heartburn, acid reflux).      . simvastatin (ZOCOR) 10 MG tablet Take 1 tablet (10 mg total) by mouth at bedtime.  90 tablet  3  . triamcinolone (KENALOG) 0.025 % ointment Apply 1 application topically 2 (two) times daily.  30 g  1  . warfarin (COUMADIN) 1 MG tablet Take 1 mg by mouth See admin instructions. Take half a tablet (0.5 mg) with half a tablet of 5 mg (total 3 mg) on all other days (Not Tuesdays, Thursdays, and Sundays).      . warfarin (COUMADIN) 5 MG tablet Take 0.5 tablets (2.5 mg total) by mouth See admin instructions. Take half a tablet (2.5 mg) on Tuesdays, Thursdays, and Sundays. Take half a tablet with half a tablet of 1 mg (total 3 mg) on all other days.  90 tablet  3  . OVER THE COUNTER MEDICATION Place 1 drop into both eyes daily as needed (dry eyes). Eye drops       No facility-administered medications prior to visit.    Allergies:  Allergies  Allergen Reactions  . Contrast Media [Iodinated Diagnostic Agents] Other (See Comments)    Per pt strong burning sensation starting in chest radiating outward  . Statins Other (See Comments)    "bones hurt"  . Celebrex [Celecoxib] Rash  . Tape Itching and Rash    Red Where applied and will spread    History   Social History  . Marital Status: Married    Spouse Name: N/A    Number of Children: N/A  . Years of Education: N/A   Occupational History  . Not on file.   Social History Main Topics  . Smoking status: Never Smoker   . Smokeless tobacco: Never Used  . Alcohol Use: No  . Drug Use: No  . Sexual Activity: Not Currently   Other Topics Concern  . Not on file   Social History Narrative  . No narrative on file    Family History  Problem Relation Age of Onset  . Heart disease Mother   . Cancer Sister     stomach  and colon  . Heart disease Brother 55  . Cancer Sister     Physical Exam: Blood pressure 138/72, pulse 62, temperature 98.1 F (36.7 C), temperature source Oral, resp. rate 14, height 5' 5"  (1.651 m), weight 231 lb (104.781 kg)., Body mass index is 38.44 kg/(m^2). General: Obese WF. Appears in no acute distress. HEENT: Normocephalic, atraumatic. Conjunctiva pink, sclera non-icteric. Pupils 2 mm constricting to 1 mm, round, regular, and equally reactive to light and accomodation. EOMI. Internal auditory canal clear. TMs with good cone of light and without pathology. Nasal mucosa pink. Nares are without discharge. No sinus tenderness. Oral mucosa pink.  Pharynx without exudate.  Neck: Supple. Trachea midline. No thyromegaly. Full ROM. No lymphadenopathy.No Carotid Bruits. Lungs: Clear to auscultation bilaterally without wheezes, rales, or rhonchi. Breathing is of normal effort and unlabored. Cardiovascular: Irregular rhythm. No murmur. . Distal pulses intact. No carotid or abdominal bruits. Breast: She defers breast exam. Has routine cancer screenings through the [facility].  Abdomen: Soft, non-tender, non-distended with normoactive bowel sounds. No hepatosplenomegaly or masses. No rebound/guarding. No CVA tenderness. No hernias.  Genitourinary: Deferred. Age 33.  Musculoskeletal: Full range of motion and 5/5 strength throughout.  Skin: Warm and moist without erythema, ecchymosis, wounds, or rash. Neuro: A+Ox3. CN II-XII grossly intact. Moves all extremities spontaneously. Full sensation throughout. Normal gait.  Psych:  Responds to questions appropriately with a normal affect.   Assessment/Plan:  79 y.o. y/o female here for CPE 1. Visit for preventive health examination   A. Screening Labs: Mass to BE met with 02/15/14 Last FLP/LFT was 10/29/13 Last CBC was 09/04/13  She says that she has labs with Dr. [name] every 6 months. Will hold off on doing any further labs today. She does  ask about her "sugar"   and diabetes.  Reviewed glucose levels on prior BMETs. I think these were all done nonfasting. If that is the case,  glucose levels have been within normal limits. However will go ahead and do an J2Q now to be certain.  B. Pap: Not indicated as she is now age 3  C. Screening Mammogram: [facility] monitors all cancer surveillance and screening and followup.  D. DEXA/BMD: She had this done 01/09/2007--Showed Osteoporosis. It was ordered by Dr. Sheela Stack by them. Pt says she was on Fosamax--no longer on this now.   E. Colorectal Cancer Screening: With a long cancer [facility]/Dr. Yancey Flemings monitors her cancer.  F. Immunizations:  Influenza:  N/A Tetanus:  Documented in Epic--04/06/2010 Pneumococcal:  According to the paper chart she received a Pneumovax in past---Christina to enter this into Epic today.      She does need to receive Prevnar 13.  Discussed this with patient and she is agreeable to receive this today. Zostavax:--Patient needs to contact her insurance company regarding cost of this. If she does want to proceed with this then she can call us and we will send prescription to her pharmacy and she will go there to actually receive the immunization.    2. Atrial fibrillation - PT with INR/Fingerstick INR therapeutic at 2.7. Continue current dose. Recheck 4 weeks.  5. Hyperglycemia - Hemoglobin A1C, fingerstick --=5.9 --pt informed at [facility] [place], [place], [facility] 03/14/2014 9:26 AM

## 2014-03-14 NOTE — Telephone Encounter (Signed)
Per Rose in CT pt is allergic to contrast and they will not be able to see PEs with out contrast, so pt will have to be premedicated before having CT done.

## 2014-03-14 NOTE — Telephone Encounter (Signed)
Pt is aware. Meds ordered for her and reviewed with pt when to take each med. Also made her aware of when CT was scheduled. Have her coming in Monday at 1:00 for a 1:15 appt. Pt is aware not to eat two hours prior to procedure.

## 2014-03-14 NOTE — Telephone Encounter (Signed)
Please rechedule and give Prednisone 60mg  PO 18 hours before the CT and 60mg  1 hour before CT.  Also have patient take Bendaryl 25mg  PO and Pepcid 20mg  PO 1 hour before CT scan.  She will need to have someone drive her since the benadryl [place] make her drowsy

## 2014-03-18 ENCOUNTER — Ambulatory Visit (INDEPENDENT_AMBULATORY_CARE_PROVIDER_SITE_OTHER)
Admission: RE | Admit: 2014-03-18 | Discharge: 2014-03-18 | Disposition: A | Discharge status: Home / Self Care | Payer: Medicare Other | Source: Ambulatory Visit | Attending: Cardiology | Admitting: Cardiology

## 2014-03-18 DIAGNOSIS — I2789 Other specified pulmonary heart diseases: Secondary | ICD-10-CM

## 2014-03-18 DIAGNOSIS — I272 Pulmonary hypertension, unspecified: Secondary | ICD-10-CM

## 2014-03-18 MED ORDER — IOHEXOL 350 MG/ML SOLN
80.0000 mL | Freq: Once | INTRAVENOUS | Status: AC | PRN
  Administered 2014-03-18: 80 mL via INTRAVENOUS

## 2014-03-25 ENCOUNTER — Telehealth: Payer: Self-pay | Admitting: Cardiology

## 2014-03-25 DIAGNOSIS — E079 Disorder of thyroid, unspecified: Secondary | ICD-10-CM

## 2014-03-25 DIAGNOSIS — E0789 Other specified disorders of thyroid: Secondary | ICD-10-CM

## 2014-03-25 NOTE — Telephone Encounter (Signed)
Tell patient that the chest CT that cardiologist did revealed thyroid lesion. Tell her that we will need to get a followup ultrasound to further evaluate this. Place order for thyroid ultrasound. Reason: 1.9 cm left lobe thyroid lesion seen on chest CT. Radiologist who read chest CT recommended followup ultrasound.

## 2014-03-25 NOTE — Telephone Encounter (Signed)
Forwarded to PCP to do further work up with thyroid lesion.

## 2014-03-25 NOTE — Telephone Encounter (Signed)
Please let patient know that chest CT also showed a 1.9cm thyroid lesion and forward study results to PCP for further workup with thyroid US

## 2014-03-26 NOTE — Addendum Note (Signed)
Addended by: Olena Mater on: 03/26/2014 08:33 AM   Modules accepted: Orders

## 2014-03-26 NOTE — Telephone Encounter (Signed)
Called and spoke to patient.  Explained thyroid lesion noted by radiologist on CT scan done by cardiologist.  Pt understands and ultrasound ordered.

## 2014-03-30 ENCOUNTER — Other Ambulatory Visit: Payer: Self-pay | Admitting: Physician Assistant

## 2014-03-30 DIAGNOSIS — E079 Disorder of thyroid, unspecified: Secondary | ICD-10-CM

## 2014-03-30 DIAGNOSIS — E0789 Other specified disorders of thyroid: Secondary | ICD-10-CM

## 2014-04-02 ENCOUNTER — Ambulatory Visit
Admission: RE | Admit: 2014-04-02 | Discharge: 2014-04-02 | Disposition: A | Discharge status: Home / Self Care | Payer: Medicare Other | Source: Ambulatory Visit | Attending: Physician Assistant | Admitting: Physician Assistant

## 2014-04-02 DIAGNOSIS — E079 Disorder of thyroid, unspecified: Secondary | ICD-10-CM

## 2014-04-02 DIAGNOSIS — E0789 Other specified disorders of thyroid: Secondary | ICD-10-CM

## 2014-04-05 ENCOUNTER — Telehealth: Payer: Self-pay | Admitting: Physician Assistant

## 2014-04-05 ENCOUNTER — Ambulatory Visit (INDEPENDENT_AMBULATORY_CARE_PROVIDER_SITE_OTHER): Payer: Medicare Other | Admitting: Internal Medicine

## 2014-04-05 ENCOUNTER — Other Ambulatory Visit: Payer: Self-pay | Admitting: Physician Assistant

## 2014-04-05 DIAGNOSIS — E041 Nontoxic single thyroid nodule: Secondary | ICD-10-CM

## 2014-04-05 DIAGNOSIS — I2789 Other specified pulmonary heart diseases: Secondary | ICD-10-CM

## 2014-04-05 DIAGNOSIS — I272 Pulmonary hypertension, unspecified: Secondary | ICD-10-CM

## 2014-04-05 NOTE — Telephone Encounter (Signed)
Referral initated

## 2014-04-05 NOTE — Telephone Encounter (Signed)
Patient is calling to get test results that she had performed at [facility]  Please call her back at [phone]

## 2014-04-05 NOTE — Progress Notes (Signed)
PFT done today. 

## 2014-04-05 NOTE — Telephone Encounter (Signed)
Call placed to patient.  US shows multiple thyroid nodules and PA requested order for US guided fine needle aspiration biopsy.   Results given to patient and referral nurse made aware of orders.

## 2014-04-08 LAB — PULMONARY FUNCTION TEST
DL/VA % pred: 97 %
DL/VA: 4.82 ml/min/mmHg/L
DLCO UNC % PRED: 69 %
DLCO unc: 17.96 ml/min/mmHg
FEF 25-75 PRE: 0.94 L/s
FEF 25-75 Post: 1.58 L/sec
FEF2575-%Change-Post: 67 %
FEF2575-%PRED-POST: 97 %
FEF2575-%Pred-Pre: 58 %
FEV1-%CHANGE-POST: 12 %
FEV1-%PRED-POST: 77 %
FEV1-%Pred-Pre: 68 %
FEV1-PRE: 1.48 L
FEV1-Post: 1.67 L
FEV1FVC-%Change-Post: 2 %
FEV1FVC-%PRED-PRE: 96 %
FEV6-%Change-Post: 10 %
FEV6-%PRED-POST: 83 %
FEV6-%Pred-Pre: 75 %
FEV6-POST: 2.28 L
FEV6-PRE: 2.06 L
FEV6FVC-%Change-Post: 0 %
FEV6FVC-%PRED-PRE: 104 %
FEV6FVC-%Pred-Post: 105 %
FVC-%Change-Post: 10 %
FVC-%PRED-PRE: 71 %
FVC-%Pred-Post: 79 %
FVC-Post: 2.28 L
FVC-Pre: 2.07 L
PRE FEV6/FVC RATIO: 100 %
Post FEV1/FVC ratio: 73 %
Post FEV6/FVC ratio: 100 %
Pre FEV1/FVC ratio: 72 %
RV % PRED: 108 %
RV: 2.61 L
TLC % PRED: 90 %
TLC: 4.73 L

## 2014-04-11 ENCOUNTER — Telehealth: Payer: Self-pay | Admitting: Cardiology

## 2014-04-11 NOTE — Telephone Encounter (Signed)
New problem ° ° °Pt returning your call. °

## 2014-04-11 NOTE — Telephone Encounter (Signed)
Pt is aware of results. 

## 2014-04-16 ENCOUNTER — Ambulatory Visit
Admission: RE | Admit: 2014-04-16 | Discharge: 2014-04-16 | Disposition: A | Discharge status: Home / Self Care | Payer: Medicare Other | Source: Ambulatory Visit | Attending: Physician Assistant | Admitting: Physician Assistant

## 2014-04-16 ENCOUNTER — Other Ambulatory Visit: Payer: BC Managed Care – HMO

## 2014-04-16 ENCOUNTER — Other Ambulatory Visit (HOSPITAL_COMMUNITY)
Admission: RE | Admit: 2014-04-16 | Discharge: 2014-04-16 | Disposition: A | Discharge status: Home / Self Care | Payer: Medicare Other | Source: Ambulatory Visit | Attending: Diagnostic Radiology | Admitting: Diagnostic Radiology

## 2014-04-16 DIAGNOSIS — I4891 Unspecified atrial fibrillation: Secondary | ICD-10-CM | POA: Diagnosis present

## 2014-04-16 DIAGNOSIS — R7309 Other abnormal glucose: Secondary | ICD-10-CM | POA: Diagnosis not present

## 2014-04-16 DIAGNOSIS — E041 Nontoxic single thyroid nodule: Secondary | ICD-10-CM

## 2014-04-16 DIAGNOSIS — E042 Nontoxic multinodular goiter: Secondary | ICD-10-CM | POA: Insufficient documentation

## 2014-04-16 LAB — PT WITH INR/FINGERSTICK
INR FINGERSTICK: 1.3 — AB (ref 0.80–1.20)
PT, fingerstick: 15.6 seconds — ABNORMAL HIGH (ref 10.4–12.5)

## 2014-04-29 ENCOUNTER — Other Ambulatory Visit (INDEPENDENT_AMBULATORY_CARE_PROVIDER_SITE_OTHER): Payer: Medicare Other

## 2014-04-29 DIAGNOSIS — E785 Hyperlipidemia, unspecified: Secondary | ICD-10-CM

## 2014-04-29 LAB — LIPID PANEL
CHOLESTEROL: 148 mg/dL (ref 0–200)
HDL: 54.7 mg/dL (ref >39.00)
LDL Cholesterol: 80 mg/dL (ref 0–99)
NonHDL: 93.3
TRIGLYCERIDES: 65 mg/dL (ref 0.0–149.0)
Total CHOL/HDL Ratio: 3
VLDL: 13 mg/dL (ref 0.0–40.0)

## 2014-04-29 LAB — HEPATIC FUNCTION PANEL
ALBUMIN: 3.9 g/dL (ref 3.5–5.2)
ALK PHOS: 66 U/L (ref 39–117)
ALT: 16 U/L (ref 0–35)
AST: 22 U/L (ref 0–37)
BILIRUBIN DIRECT: 0.1 mg/dL (ref 0.0–0.3)
TOTAL PROTEIN: 7.8 g/dL (ref 6.0–8.3)
Total Bilirubin: 0.6 mg/dL (ref 0.2–1.2)

## 2014-04-30 ENCOUNTER — Encounter: Payer: Self-pay | Admitting: General Surgery

## 2014-04-30 ENCOUNTER — Other Ambulatory Visit: Payer: Self-pay | Admitting: General Surgery

## 2014-04-30 DIAGNOSIS — E785 Hyperlipidemia, unspecified: Secondary | ICD-10-CM

## 2014-05-16 ENCOUNTER — Other Ambulatory Visit: Payer: Self-pay | Admitting: Physician Assistant

## 2014-06-01 ENCOUNTER — Telehealth: Payer: Self-pay | Admitting: Hematology and Oncology

## 2014-06-01 NOTE — Telephone Encounter (Signed)
Spk w/pt confirming MD/schedule change, mailing out updated sch.....  KJ °

## 2014-06-12 ENCOUNTER — Encounter: Payer: Self-pay | Admitting: Physician Assistant

## 2014-06-12 ENCOUNTER — Ambulatory Visit (INDEPENDENT_AMBULATORY_CARE_PROVIDER_SITE_OTHER): Payer: BC Managed Care – HMO | Admitting: Physician Assistant

## 2014-06-12 VITALS — BP 164/80 | HR 56 | Temp 98.1°F | Resp 18 | Wt 236.0 lb

## 2014-06-12 DIAGNOSIS — R05 Cough: Secondary | ICD-10-CM

## 2014-06-12 DIAGNOSIS — I482 Chronic atrial fibrillation, unspecified: Secondary | ICD-10-CM

## 2014-06-12 DIAGNOSIS — R059 Cough, unspecified: Secondary | ICD-10-CM

## 2014-06-12 DIAGNOSIS — Z7901 Long term (current) use of anticoagulants: Secondary | ICD-10-CM

## 2014-06-12 LAB — PT WITH INR/FINGERSTICK
INR, fingerstick: 2.5 — ABNORMAL HIGH (ref 0.80–1.20)
PT, fingerstick: 29.5 seconds — ABNORMAL HIGH (ref 10.4–12.5)

## 2014-06-12 NOTE — Progress Notes (Signed)
Patient ID: [name] MRN: [id number], DOB: [birth year], 79 y.o. Date of Encounter: @DATE @  Chief Complaint:  Chief Complaint  Patient presents with  . persistant cough    thinks from thyroid tests  . check PT/INR    HPI: 79 y.o. year old white female  presents for the above.  She is due to recheck her PT/INR. She states that she has been taking her Coumadin as directed. Has had no skips doses. No new medication changes recently. Has had no bleeding. No melena no hematochezia.  She states that she's been having a hacky cough for about 2 weeks now. She feels a tickle in her throat and that causes her to cough. She has had no sneezing and no rhinorrhea. She does not feel any congestion deep in her chest and does not feel that she's having any deep cough. States that she has not had any phlegm production when she has this cough. Has had no fevers or chills. I saw Pepcid and Prilosec on her medicine list but she says that she only takes these "when needed" and not daily. Also when I discussed using a medicine such as Zyrtec,  she says that she has "generic Zyrtec" at home and "uses that when needed". However has not been taking that daily and has not been taking that recently.   Past Medical History  Diagnosis Date  . Cancer     right colon and left breast  . Breast cancer 01/06/2012  . Colon cancer 01/06/2012  . Hernia   . GERD (gastroesophageal reflux disease)   . Hypertension   . Allergy     rhinitis  . Osteopenia   . Diverticulosis   . Pneumonia     hx child  . Hiatal hernia     denies  . Arthritis   . Coronary artery disease 2006    nonobstructive with 20-30% ostial LAD and LM  . Atrial fibrillation     chronic atrial fibrillation  . Edema extremities   . Hyperlipidemia   . COPD (chronic obstructive pulmonary disease)      Home Meds: Outpatient Prescriptions Prior to Visit  Medication Sig Dispense Refill  . acetaminophen (TYLENOL) 325 MG tablet Take 650 mg by  mouth every 6 (six) hours as needed for mild pain.      . Cholecalciferol (VITAMIN D) 2000 UNITS CAPS Take 1 capsule by mouth daily.      Marland Kitchen diltiazem (CARDIZEM CD) 180 MG 24 hr capsule Take 1 capsule (180 mg total) by mouth daily.  90 capsule  3  . diphenhydrAMINE (BENADRYL) 25 mg capsule Take one tablet 1 hour prior to CT  1 capsule  0  . famotidine (PEPCID) 20 MG tablet Take one tablet 1 hour prior to CT  1 tablet  0  . furosemide (LASIX) 20 MG tablet Take 1 tablet (20 mg total) by mouth daily.  90 tablet  1  . isosorbide dinitrate (ISORDIL) 40 MG tablet Take 1 tablet (40 mg total) by mouth 2 (two) times daily.  180 tablet  1  . losartan (COZAAR) 100 MG tablet Take 1 tablet (100 mg total) by mouth daily.  90 tablet  3  . nitroGLYCERIN (NITROSTAT) 0.4 MG SL tablet Place 1 tablet (0.4 mg total) under the tongue every 5 (five) minutes as needed for chest pain.  50 tablet  3  . omeprazole (PRILOSEC) 20 MG capsule Take 20 mg by mouth daily as needed (heartburn, acid reflux).      Marland Kitchen  OVER THE COUNTER MEDICATION Place 1 drop into both eyes daily as needed (dry eyes). Eye drops      . predniSONE (DELTASONE) 20 MG tablet Take three tablets 18 hours prior to CT then take 3 tablets 1 hour before CT.  6 tablet  0  . simvastatin (ZOCOR) 10 MG tablet Take 1 tablet (10 mg total) by mouth at bedtime.  90 tablet  3  . triamcinolone (KENALOG) 0.025 % ointment Apply 1 application topically 2 (two) times daily.  30 g  1  . warfarin (COUMADIN) 1 MG tablet Take 1 mg by mouth See admin instructions. Take half a tablet (0.5 mg) with half a tablet of 5 mg (total 3 mg) on all other days (Not Tuesdays, Thursdays, and Sundays).      . warfarin (COUMADIN) 1 MG tablet TAKE AS DIRECTED  30 tablet  5  . warfarin (COUMADIN) 5 MG tablet Take 0.5 tablets (2.5 mg total) by mouth See admin instructions. Take half a tablet (2.5 mg) on Tuesdays, Thursdays, and Sundays. Take half a tablet with half a tablet of 1 mg (total 3 mg) on all  other days.  90 tablet  3   No facility-administered medications prior to visit.    Allergies:  Allergies  Allergen Reactions  . Contrast Media [Iodinated Diagnostic Agents] Other (See Comments)    Per pt strong burning sensation starting in chest radiating outward  . Statins Other (See Comments)    "bones hurt"  . Celebrex [Celecoxib] Rash  . Tape Itching and Rash    Red Where applied and will spread    History   Social History  . Marital Status: Married    Spouse Name: N/A    Number of Children: N/A  . Years of Education: N/A   Occupational History  . Not on file.   Social History Main Topics  . Smoking status: Never Smoker   . Smokeless tobacco: Never Used  . Alcohol Use: No  . Drug Use: No  . Sexual Activity: Not Currently   Other Topics Concern  . Not on file   Social History Narrative  . No narrative on file    Family History  Problem Relation Age of Onset  . Heart disease Mother   . Cancer Sister     stomach and colon  . Heart disease Brother 60  . Cancer Sister      Review of Systems:  See HPI for pertinent ROS. All other ROS negative.    Physical Exam: Blood pressure 164/80, pulse 56, temperature 98.1 F (36.7 C), temperature source Oral, resp. rate 18, weight 236 lb (107.049 kg), SpO2 95.00%., Body mass index is 39.27 kg/(m^2). General: Obese WF. Appears in no acute distress. Head: Normocephalic, atraumatic, eyes without discharge, sclera non-icteric, nares are without discharge. Bilateral auditory canals clear, TM's are without perforation, pearly grey and translucent with reflective cone of light bilaterally. Oral cavity moist, posterior pharynx without exudate, erythema, peritonsillar abscess.  Neck: Supple. No thyromegaly. No lymphadenopathy. No tenderness with palpation of the neck. Lungs: Clear bilaterally to auscultation without wheezes, rales, or rhonchi. Breathing is unlabored. Heart: Irregular rhythym Abdomen: Soft, non-tender,  non-distended with normoactive bowel sounds. No hepatomegaly. No rebound/guarding. No obvious abdominal masses. Musculoskeletal:  Strength and tone normal for age. Extremities/Skin: Warm and dry. Neuro: Alert and oriented X 3. Moves all extremities spontaneously. Gait is normal. CNII-XII grossly in tact. Psych:  Responds to questions appropriately with a normal affect.   Results for orders  placed in visit on 06/12/14  PT WITH INR/FINGERSTICK      Result Value Ref Range   PT, fingerstick 29.5 (*) 10.4 - 12.5 seconds   INR, fingerstick 2.5 (*) 0.80 - 1.20     ASSESSMENT AND PLAN:  79 y.o. year old female with  1. Long-term (current) use of anticoagulants - PT with INR/Fingerstick  2. Chronic atrial fibrillation  3. Chronic anticoagulation  4. Cough   INR therapeutic at 2.5. Continue current dose of Coumadin. Recheck PT/INR in 4 weeks.  Regarding her tickle in her throat causing hacky cough--- discussed with her that most common causes could be either GERD or some post nasal drip. I wrote down on her AVS for her to go ahead and start taking the Prilosec daily for the next 2 weeks and to also take Zyrtec daily for the next 2 weeks. If her tickle in her throat and hacky cough are not resolved in 2 weeks then come in for followup office visit for further evaluation.  (Also reviewed the fact that she had CT chest 03/2014 )   Signed, [address] [place], [place], [facility] 06/12/2014 9:18 AM

## 2014-06-13 ENCOUNTER — Encounter: Payer: Self-pay | Admitting: Cardiology

## 2014-07-10 ENCOUNTER — Ambulatory Visit (INDEPENDENT_AMBULATORY_CARE_PROVIDER_SITE_OTHER): Payer: BC Managed Care – HMO | Admitting: Physician Assistant

## 2014-07-10 ENCOUNTER — Encounter: Payer: Self-pay | Admitting: Physician Assistant

## 2014-07-10 VITALS — BP 164/80 | HR 60 | Temp 98.0°F | Resp 18 | Wt 238.0 lb

## 2014-07-10 DIAGNOSIS — Z79899 Other long term (current) drug therapy: Secondary | ICD-10-CM

## 2014-07-10 DIAGNOSIS — I482 Chronic atrial fibrillation, unspecified: Secondary | ICD-10-CM

## 2014-07-10 DIAGNOSIS — I2510 Atherosclerotic heart disease of native coronary artery without angina pectoris: Secondary | ICD-10-CM

## 2014-07-10 DIAGNOSIS — Z7901 Long term (current) use of anticoagulants: Secondary | ICD-10-CM

## 2014-07-10 LAB — PT WITH INR/FINGERSTICK
INR, fingerstick: 2.6 — ABNORMAL HIGH (ref 0.80–1.20)
PT, fingerstick: 31.7 seconds — ABNORMAL HIGH (ref 10.4–12.5)

## 2014-07-10 NOTE — Progress Notes (Signed)
Patient ID: [name] MRN: [id number], DOB: Eagleson 30, 1936, 79 y.o. Date of Encounter: 07/10/2014, 3:54 PM    Chief Complaint:  Chief Complaint  Patient presents with  . 4 week follow up    c/o alot of body aches      HPI: 79 y.o. year old white obese female here secondary to the above and also for her PT/INR Coumadin check.  She points to the area at the lower border of her sternum as the area of pain. Also points to her shoulder blades as area of pain. Says that she notices this with even just washing the dishes.  She is taking her Coumadin as directed. Has had no skipped doses. His had no bleeding.     Home Meds:   Outpatient Prescriptions Prior to Visit  Medication Sig Dispense Refill  . acetaminophen (TYLENOL) 325 MG tablet Take 650 mg by mouth every 6 (six) hours as needed for mild pain.    . Cholecalciferol (VITAMIN D) 2000 UNITS CAPS Take 1 capsule by mouth daily.    Marland Kitchen diltiazem (CARDIZEM CD) 180 MG 24 hr capsule Take 1 capsule (180 mg total) by mouth daily. 90 capsule 3  . famotidine (PEPCID) 20 MG tablet Take one tablet 1 hour prior to CT 1 tablet 0  . furosemide (LASIX) 20 MG tablet Take 1 tablet (20 mg total) by mouth daily. 90 tablet 1  . isosorbide dinitrate (ISORDIL) 40 MG tablet Take 1 tablet (40 mg total) by mouth 2 (two) times daily. 180 tablet 1  . losartan (COZAAR) 100 MG tablet Take 1 tablet (100 mg total) by mouth daily. 90 tablet 3  . nitroGLYCERIN (NITROSTAT) 0.4 MG SL tablet Place 1 tablet (0.4 mg total) under the tongue every 5 (five) minutes as needed for chest pain. 50 tablet 3  . omeprazole (PRILOSEC) 20 MG capsule Take 20 mg by mouth daily as needed (heartburn, acid reflux).    Marland Kitchen OVER THE COUNTER MEDICATION Place 1 drop into both eyes daily as needed (dry eyes). Eye drops    . simvastatin (ZOCOR) 10 MG tablet Take 1 tablet (10 mg total) by mouth at bedtime. 90 tablet 3  . triamcinolone (KENALOG) 0.025 % ointment Apply 1 application topically 2 (two)  times daily. 30 g 1  . warfarin (COUMADIN) 1 MG tablet Take 1 mg by mouth See admin instructions. Take half a tablet (0.5 mg) with half a tablet of 5 mg (total 3 mg) on all other days (Not Tuesdays, Thursdays, and Sundays).    . warfarin (COUMADIN) 1 MG tablet TAKE AS DIRECTED 30 tablet 5  . warfarin (COUMADIN) 5 MG tablet Take 0.5 tablets (2.5 mg total) by mouth See admin instructions. Take half a tablet (2.5 mg) on Tuesdays, Thursdays, and Sundays. Take half a tablet with half a tablet of 1 mg (total 3 mg) on all other days. 90 tablet 3  . predniSONE (DELTASONE) 20 MG tablet Take three tablets 18 hours prior to CT then take 3 tablets 1 hour before CT. 6 tablet 0  . diphenhydrAMINE (BENADRYL) 25 mg capsule Take one tablet 1 hour prior to CT 1 capsule 0   No facility-administered medications prior to visit.    Allergies:  Allergies  Allergen Reactions  . Contrast Media [Iodinated Diagnostic Agents] Other (See Comments)    Per pt strong burning sensation starting in chest radiating outward  . Statins Other (See Comments)    "bones hurt"  . Celebrex [Celecoxib] Rash  .  Tape Itching and Rash    Red Where applied and will spread      Review of Systems: See HPI for pertinent ROS. All other ROS negative.    Physical Exam: Blood pressure 164/80, pulse 60, temperature 98 F (36.7 C), temperature source Oral, resp. rate 18, weight 238 lb (107.956 kg), SpO2 96 %., Body mass index is 39.61 kg/(m^2). General:  Obese WF. Appears in no acute distress. Neck: Supple. No thyromegaly. No lymphadenopathy. Lungs: Clear bilaterally to auscultation without wheezes, rales, or rhonchi. Breathing is unlabored. Heart: irregular rhythm.. Msk:  Strength and tone normal for age. She is severely tender with palpation at the bottom edge of her sternum. The underwire of her bra runs right across that area. She is also severely tender with palpation all along the edges of her scapula  bilaterally. Extremities/Skin: Warm and dry. No LE edema. Neuro: Alert and oriented X 3. Moves all extremities spontaneously. Gait is normal. CNII-XII grossly in tact. Psych:  Responds to questions appropriately with a normal affect.     ASSESSMENT AND PLAN:  79 y.o. year old female with  1. Chronic atrial fibrillation - PT with INR/Fingerstick  2. Encounter for long-term (current) use of other high-risk medications - PT with INR/Fingerstick  3. Chronic anticoagulation  Results for orders placed or performed in visit on 07/10/14  PT with INR/Fingerstick  Result Value Ref Range   PT, fingerstick 31.7 (H) 10.4 - 12.5 seconds   INR, fingerstick 2.6 (H) 0.80 - 1.20    INR therapeutic. Continue current dose of Coumadin. Recheck 4 weeks.  Reassured patient that all of her discomfort is musculoskeletal. Recommended that she not wear a bra while she is at home so that she can take a break from having this pressure pressing on her sternum. Also showed her range of motion stretches and other stretches to do for her shoulder area and scapular area. Also recommended that she apply heat to the area using heating pad and warm water in the shower.    [address] [place], [place], [facility] 07/10/2014 3:54 PM

## 2014-08-07 ENCOUNTER — Other Ambulatory Visit: Payer: Self-pay

## 2014-08-07 DIAGNOSIS — C50919 Malignant neoplasm of unspecified site of unspecified female breast: Secondary | ICD-10-CM

## 2014-08-08 ENCOUNTER — Other Ambulatory Visit (HOSPITAL_BASED_OUTPATIENT_CLINIC_OR_DEPARTMENT_OTHER): Payer: Medicare Other

## 2014-08-08 ENCOUNTER — Ambulatory Visit (HOSPITAL_BASED_OUTPATIENT_CLINIC_OR_DEPARTMENT_OTHER): Payer: Medicare Other | Admitting: Hematology and Oncology

## 2014-08-08 VITALS — BP 167/63 | HR 71 | Temp 97.3°F | Resp 19 | Ht 65.0 in | Wt 234.5 lb

## 2014-08-08 DIAGNOSIS — Z85038 Personal history of other malignant neoplasm of large intestine: Secondary | ICD-10-CM

## 2014-08-08 DIAGNOSIS — C50919 Malignant neoplasm of unspecified site of unspecified female breast: Secondary | ICD-10-CM

## 2014-08-08 DIAGNOSIS — Z853 Personal history of malignant neoplasm of breast: Secondary | ICD-10-CM

## 2014-08-08 DIAGNOSIS — C50412 Malignant neoplasm of upper-outer quadrant of left female breast: Secondary | ICD-10-CM

## 2014-08-08 LAB — COMPREHENSIVE METABOLIC PANEL (CC13)
ALBUMIN: 3.8 g/dL (ref 3.5–5.0)
ALK PHOS: 92 U/L (ref 40–150)
ALT: 18 U/L (ref 0–55)
AST: 21 U/L (ref 5–34)
Anion Gap: 8 mEq/L (ref 3–11)
BUN: 14.4 mg/dL (ref 7.0–26.0)
CALCIUM: 9.8 mg/dL (ref 8.4–10.4)
CO2: 26 mEq/L (ref 22–29)
CREATININE: 0.8 mg/dL (ref 0.6–1.1)
Chloride: 106 mEq/L (ref 98–109)
EGFR: 70 mL/min/{1.73_m2} — AB (ref >90)
Glucose: 119 mg/dl (ref 70–140)
POTASSIUM: 4.2 meq/L (ref 3.5–5.1)
Sodium: 140 mEq/L (ref 136–145)
Total Bilirubin: 0.49 mg/dL (ref 0.20–1.20)
Total Protein: 7.8 g/dL (ref 6.4–8.3)

## 2014-08-08 LAB — CBC WITH DIFFERENTIAL/PLATELET
BASO%: 0.6 % (ref 0.0–2.0)
Basophils Absolute: 0 10*3/uL (ref 0.0–0.1)
EOS ABS: 0.1 10*3/uL (ref 0.0–0.5)
EOS%: 1.2 % (ref 0.0–7.0)
HEMATOCRIT: 38.7 % (ref 34.8–46.6)
HEMOGLOBIN: 12.4 g/dL (ref 11.6–15.9)
LYMPH%: 39.4 % (ref 14.0–49.7)
MCH: 29.8 pg (ref 25.1–34.0)
MCHC: 32 g/dL (ref 31.5–36.0)
MCV: 93 fL (ref 79.5–101.0)
MONO#: 0.7 10*3/uL (ref 0.1–0.9)
MONO%: 11.1 % (ref 0.0–14.0)
NEUT%: 47.7 % (ref 38.4–76.8)
NEUTROS ABS: 3 10*3/uL (ref 1.5–6.5)
PLATELETS: 243 10*3/uL (ref 145–400)
RBC: 4.16 10*6/uL (ref 3.70–5.45)
RDW: 13.5 % (ref 11.2–14.5)
WBC: 6.2 10*3/uL (ref 3.9–10.3)
lymph#: 2.4 10*3/uL (ref 0.9–3.3)

## 2014-08-08 NOTE — Assessment & Plan Note (Signed)
Left breast cancer stage I invasive ductal carcinoma ER/PR positive HER-2 negative diagnosed in March 2008 status post lumpectomy and sentinel lymph node study that for years of antiestrogen therapy completed in 2013  Surveillance: Patient got a mammogram because her breasts feel uncomfortable when to do the mammogram and she continues to have pain in the breast and today's breast exam did not reveal any abnormalities. I encouraged her to continue to get annual mammograms and followups and physical exams. Patient will think about it.  Survivorship: Encouraged the patient to continue to walk 30 minutes every day to get some physical exercise. Also recommended some weightbearing exercises. Discussed importance of fruits and vegetables and less red meat.  Patient wishes to followup with general surgery who have been doing annual followups and physical exams. She will be seen by [place] on an as-needed basis.

## 2014-08-08 NOTE — Progress Notes (Signed)
Patient Care Team: Orlena Sheldon, PA-C as PCP - General (Unknown Physician Specialty)  Diagnosis: 1. History of stage II colon carcinoma originally diagnosed September 2003.  #2 history of invasive ductal carcinoma of the left breast diagnosed March 2008 status post lumpectomy with sentinel node dissection for a stage I ER positive PR positive HER-2/neu negative breast cancer.  PRIOR THERAPY: #1 patient with history of stage II colon carcinoma on observation only without evidence of recurrent disease patient is now 10 years out since diagnosis.  #2 stage I invasive ductal carcinoma of the left breast. Patient has received tamoxifen and Aromasin in the past she has completed a total of 4 years of therapy. Her therapy was discontinued due to nipple discharge and patient declined any further treatments for her breast cancer.  CURRENT THERAPY:Observation  CHIEF COMPLIANT: annual followup of breast and colon cancers  INTERVAL HISTORY: [name] is a 79 year old Caucasian with above-mentioned history of stage II colon cancer. She is here for annual followup. She reports of the right breast is slightly tender ever since that time last year stress imaging. She did not get this years mammogram because of this complaint. She denies any bowel issues related to colon cancer.otherwise her health has been fairly good.  REVIEW OF SYSTEMS:   Constitutional: Denies fevers, chills or abnormal weight loss Eyes: Denies blurriness of vision Ears, nose, mouth, throat, and face: Denies mucositis or sore throat Respiratory: Denies cough, dyspnea or wheezes Cardiovascular: Denies palpitation, chest discomfort or lower extremity swelling Gastrointestinal:  Denies nausea, heartburn or change in bowel habits Skin: Denies abnormal skin rashes Lymphatics: Denies new lymphadenopathy or easy bruising Neurological:Denies numbness, tingling or new weaknesses Behavioral/Psych: Mood is stable, no new changes  Breast: mild  discomfort in the right breast All other systems were reviewed with the patient and are negative.  I have reviewed the past medical history, past surgical history, social history and family history with the patient and they are unchanged from previous note.  ALLERGIES:  is allergic to contrast media; statins; celebrex; and tape.  MEDICATIONS:  Current Outpatient Prescriptions  Medication Sig Dispense Refill  . acetaminophen (TYLENOL) 325 MG tablet Take 650 mg by mouth every 6 (six) hours as needed for mild pain.    . Cholecalciferol (VITAMIN D) 2000 UNITS CAPS Take 1 capsule by mouth daily.    Marland Kitchen diltiazem (CARDIZEM CD) 180 MG 24 hr capsule Take 1 capsule (180 mg total) by mouth daily. 90 capsule 3  . famotidine (PEPCID) 20 MG tablet Take one tablet 1 hour prior to CT 1 tablet 0  . furosemide (LASIX) 20 MG tablet Take 1 tablet (20 mg total) by mouth daily. 90 tablet 1  . isosorbide dinitrate (ISORDIL) 40 MG tablet Take 1 tablet (40 mg total) by mouth 2 (two) times daily. 180 tablet 1  . losartan (COZAAR) 100 MG tablet Take 1 tablet (100 mg total) by mouth daily. 90 tablet 3  . nitroGLYCERIN (NITROSTAT) 0.4 MG SL tablet Place 1 tablet (0.4 mg total) under the tongue every 5 (five) minutes as needed for chest pain. 50 tablet 3  . omeprazole (PRILOSEC) 20 MG capsule Take 20 mg by mouth daily as needed (heartburn, acid reflux).    Marland Kitchen OVER THE COUNTER MEDICATION Place 1 drop into both eyes daily as needed (dry eyes). Eye drops    . simvastatin (ZOCOR) 10 MG tablet Take 1 tablet (10 mg total) by mouth at bedtime. 90 tablet 3  . triamcinolone (KENALOG) 0.025 %  ointment Apply 1 application topically 2 (two) times daily. 30 g 1  . warfarin (COUMADIN) 1 MG tablet Take 1 mg by mouth See admin instructions. Take half a tablet (0.5 mg) with half a tablet of 5 mg (total 3 mg) on all other days (Not Tuesdays, Thursdays, and Sundays).    . warfarin (COUMADIN) 1 MG tablet TAKE AS DIRECTED 30 tablet 5  .  warfarin (COUMADIN) 5 MG tablet Take 0.5 tablets (2.5 mg total) by mouth See admin instructions. Take half a tablet (2.5 mg) on Tuesdays, Thursdays, and Sundays. Take half a tablet with half a tablet of 1 mg (total 3 mg) on all other days. 90 tablet 3   No current facility-administered medications for this visit.    PHYSICAL EXAMINATION: ECOG PERFORMANCE STATUS: 1 - Symptomatic but completely ambulatory  Filed Vitals:   08/08/14 1006  BP: 167/63  Pulse: 71  Temp: 97.3 F (36.3 C)  Resp: 19   Filed Weights   08/08/14 1006  Weight: 234 lb 8 oz (106.369 kg)    GENERAL:alert, no distress and comfortable SKIN: skin color, texture, turgor are normal, no rashes or significant lesions EYES: normal, Conjunctiva are pink and non-injected, sclera clear OROPHARYNX:no exudate, no erythema and lips, buccal mucosa, and tongue normal  NECK: supple, thyroid normal size, non-tender, without nodularity LYMPH:  no palpable lymphadenopathy in the cervical, axillary or inguinal LUNGS: clear to auscultation and percussion with normal breathing effort HEART: regular rate & rhythm and no murmurs and no lower extremity edema ABDOMEN:abdomen soft, non-tender and normal bowel sounds Musculoskeletal:no cyanosis of digits and no clubbing  NEURO: alert & oriented x 3 with fluent speech, no focal motor/sensory deficits BREAST: No palpable masses or nodules in either right or left breasts. No palpable axillary supraclavicular or infraclavicular adenopathy. Mild tenderness in the right lower outer quadrant right breast   LABORATORY DATA:  I have reviewed the data as listed   Chemistry      Component Value Date/Time   NA 140 08/08/2014 0951   NA 138 02/15/2014 1034   NA 140 04/09/2010 1247   K 4.2 08/08/2014 0951   K 4.0 02/15/2014 1034   K 4.1 04/09/2010 1247   CL 104 02/15/2014 1034   CL 102 02/09/2013 0947   CL 100 04/09/2010 1247   CO2 26 08/08/2014 0951   CO2 28 02/15/2014 1034   CO2 26  04/09/2010 1247   BUN 14.4 08/08/2014 0951   BUN 9 02/15/2014 1034   BUN 11 04/09/2010 1247   CREATININE 0.8 08/08/2014 0951   CREATININE 0.8 02/15/2014 1034   CREATININE 0.69 02/26/2013 1009      Component Value Date/Time   CALCIUM 9.8 08/08/2014 0951   CALCIUM 9.3 02/15/2014 1034   CALCIUM 9.3 04/09/2010 1247   ALKPHOS 92 08/08/2014 0951   ALKPHOS 66 04/29/2014 0929   ALKPHOS 41 04/09/2010 1247   AST 21 08/08/2014 0951   AST 22 04/29/2014 0929   AST 22 04/09/2010 1247   ALT 18 08/08/2014 0951   ALT 16 04/29/2014 0929   ALT 17 04/09/2010 1247   BILITOT 0.49 08/08/2014 0951   BILITOT 0.6 04/29/2014 0929   BILITOT 0.40 04/09/2010 1247       Lab Results  Component Value Date   WBC 6.2 08/08/2014   HGB 12.4 08/08/2014   HCT 38.7 08/08/2014   MCV 93.0 08/08/2014   PLT 243 08/08/2014   NEUTROABS 3.0 08/08/2014   ASSESSMENT & PLAN:  Breast cancer of  upper-outer quadrant of left female breast Left breast cancer stage I invasive ductal carcinoma ER/PR positive HER-2 negative diagnosed in March 2008 status post lumpectomy and sentinel lymph node study that for years of antiestrogen therapy completed in 2013  Surveillance: Patient got a mammogram because her breasts feel uncomfortable when to do the mammogram and she continues to have pain in the breast and today's breast exam did not reveal any abnormalities. I encouraged her to continue to get annual mammograms and followups and physical exams. Patient will think about it.  Survivorship: Encouraged the patient to continue to walk 30 minutes every day to get some physical exercise. Also recommended some weightbearing exercises. Discussed importance of fruits and vegetables and less red meat.  Patient wishes to followup with general surgery who have been doing annual followups and physical exams. She will be seen by [place] on an as-needed basis. I reviewed her blood work which was normal.  No orders of the defined types were placed  in this encounter.   The patient has a good understanding of the overall plan. she agrees with it. She will call with any problems that [place] develop before her next visit here.   Rulon Eisenmenger, MD 08/08/2014 10:58 AM

## 2014-08-09 ENCOUNTER — Other Ambulatory Visit: Payer: Medicare Other

## 2014-08-09 ENCOUNTER — Ambulatory Visit: Payer: Medicare Other | Admitting: Oncology

## 2014-08-15 ENCOUNTER — Ambulatory Visit (INDEPENDENT_AMBULATORY_CARE_PROVIDER_SITE_OTHER): Payer: Medicare Other | Admitting: Cardiology

## 2014-08-15 ENCOUNTER — Encounter: Payer: Self-pay | Admitting: Cardiology

## 2014-08-15 VITALS — BP 136/60 | HR 57 | Ht 66.0 in | Wt 232.4 lb

## 2014-08-15 DIAGNOSIS — R0609 Other forms of dyspnea: Secondary | ICD-10-CM

## 2014-08-15 DIAGNOSIS — R0600 Dyspnea, unspecified: Secondary | ICD-10-CM | POA: Insufficient documentation

## 2014-08-15 DIAGNOSIS — I2583 Coronary atherosclerosis due to lipid rich plaque: Principal | ICD-10-CM

## 2014-08-15 DIAGNOSIS — I10 Essential (primary) hypertension: Secondary | ICD-10-CM

## 2014-08-15 DIAGNOSIS — I272 Pulmonary hypertension, unspecified: Secondary | ICD-10-CM | POA: Insufficient documentation

## 2014-08-15 DIAGNOSIS — I2510 Atherosclerotic heart disease of native coronary artery without angina pectoris: Secondary | ICD-10-CM

## 2014-08-15 DIAGNOSIS — I482 Chronic atrial fibrillation, unspecified: Secondary | ICD-10-CM

## 2014-08-15 DIAGNOSIS — R0602 Shortness of breath: Secondary | ICD-10-CM

## 2014-08-15 DIAGNOSIS — E785 Hyperlipidemia, unspecified: Secondary | ICD-10-CM

## 2014-08-15 DIAGNOSIS — I270 Primary pulmonary hypertension: Secondary | ICD-10-CM

## 2014-08-15 DIAGNOSIS — I77810 Thoracic aortic ectasia: Secondary | ICD-10-CM

## 2014-08-15 NOTE — Patient Instructions (Signed)
Your physician has requested that you have an echocardiogram. Echocardiography is a painless test that uses sound waves to create images of your heart. It provides your doctor with information about the size and shape of your heart and how well your heart's chambers and valves are working. This procedure takes approximately one hour. There are no restrictions for this procedure.  Your physician has requested that you have a lexiscan myoview. For further information please visit [email or web address]. Please follow instruction sheet, as given.  Your physician has recommended that you have a sleep study. This test records several body functions during sleep, including: brain activity, eye movement, oxygen and carbon dioxide blood levels, heart rate and rhythm, breathing rate and rhythm, the flow of air through your mouth and nose, snoring, body muscle movements, and chest and belly movement.  Your physician wants you to follow-up in: 6 months with Dr. [name]. You will receive a reminder letter in the mail two months in advance. If you don't receive a letter, please call our office to schedule the follow-up appointment.

## 2014-08-15 NOTE — Progress Notes (Signed)
[name], [name], [name]  [address] Phone: [phone] Fax:  [phone]  Date:  08/15/2014   ID:  [name], DOB Millan 07, 1936, MRN [id number]  PCP:  Karis Juba, PA-C  Cardiologist:  Fransico Him, MD    History of Present Illness: [name] is a 79 y.o. female with a history of CAD, HTN, chronic diastolic CHF, dyslipidemia, moderate pulmonary HTN, mildly dilated aortic root and chronic AF. She is doing well. She denies any chest pain or pressure, dizziness, palpitations or syncope.  She has chronic SOB which she thinks is stable. She has chronic LE edema which is stable.     Wt Readings from Last 3 Encounters:  08/15/14 232 lb 6.4 oz (105.416 kg)  08/08/14 234 lb 8 oz (106.369 kg)  07/10/14 238 lb (107.956 kg)     Past Medical History  Diagnosis Date  . Cancer     right colon and left breast  . Breast cancer 01/06/2012  . Colon cancer 01/06/2012  . Hernia   . GERD (gastroesophageal reflux disease)   . Hypertension   . Allergy     rhinitis  . Osteopenia   . Diverticulosis   . Pneumonia     hx child  . Hiatal hernia     denies  . Arthritis   . Coronary artery disease 2006    nonobstructive with 20-30% ostial LAD and LM  . Atrial fibrillation     chronic atrial fibrillation  . Edema extremities   . Hyperlipidemia   . COPD (chronic obstructive pulmonary disease)   . Dilated aortic root   . Pulmonary HTN     moderate PASP 37mmHg    Current Outpatient Prescriptions  Medication Sig Dispense Refill  . acetaminophen (TYLENOL) 325 MG tablet Take 650 mg by mouth every 6 (six) hours as needed for mild pain.    . Cholecalciferol (VITAMIN D) 2000 UNITS CAPS Take 1 capsule by mouth daily.    Marland Kitchen diltiazem (CARDIZEM CD) 180 MG 24 hr capsule Take 1 capsule (180 mg total) by mouth daily. 90 capsule 3  . famotidine (PEPCID) 20 MG tablet Take one tablet 1 hour prior to CT 1 tablet 0  . furosemide (LASIX) 20 MG tablet Take 1 tablet (20 mg total) by mouth daily.  90 tablet 1  . isosorbide dinitrate (ISORDIL) 40 MG tablet Take 1 tablet (40 mg total) by mouth 2 (two) times daily. 180 tablet 1  . losartan (COZAAR) 100 MG tablet Take 1 tablet (100 mg total) by mouth daily. 90 tablet 3  . nitroGLYCERIN (NITROSTAT) 0.4 MG SL tablet Place 1 tablet (0.4 mg total) under the tongue every 5 (five) minutes as needed for chest pain. 50 tablet 3  . omeprazole (PRILOSEC) 20 MG capsule Take 20 mg by mouth daily as needed (heartburn, acid reflux).    Marland Kitchen OVER THE COUNTER MEDICATION Place 1 drop into both eyes daily as needed (dry eyes). Eye drops    . simvastatin (ZOCOR) 10 MG tablet Take 1 tablet (10 mg total) by mouth at bedtime. 90 tablet 3  . triamcinolone (KENALOG) 0.025 % ointment Apply 1 application topically 2 (two) times daily. 30 g 1  . warfarin (COUMADIN) 1 MG tablet Take 1 mg by mouth See admin instructions. Take half a tablet (0.5 mg) with half a tablet of 5 mg (total 3 mg) on all other days (Not Tuesdays, Thursdays, and Sundays).    . warfarin (COUMADIN) 5 MG tablet Take  0.5 tablets (2.5 mg total) by mouth See admin instructions. Take half a tablet (2.5 mg) on Tuesdays, Thursdays, and Sundays. Take half a tablet with half a tablet of 1 mg (total 3 mg) on all other days. 90 tablet 3  . warfarin (COUMADIN) 1 MG tablet TAKE AS DIRECTED (Patient not taking: Reported on 08/15/2014) 30 tablet 5   No current facility-administered medications for this visit.    Allergies:    Allergies  Allergen Reactions  . Contrast Media [Iodinated Diagnostic Agents] Other (See Comments)    Per pt strong burning sensation starting in chest radiating outward  . Statins Other (See Comments)    "bones hurt"  . Celebrex [Celecoxib] Rash  . Tape Itching and Rash    Red Where applied and will spread    Social History:  The patient  reports that she has never smoked. She has never used smokeless tobacco. She reports that she does not drink alcohol or use illicit drugs.   Family  History:  The patient's family history includes Cancer in her sister and sister; Heart disease in her mother; Heart disease (age of onset: 69) in her brother.   ROS:  Please see the history of present illness.      All other systems reviewed and negative.   PHYSICAL EXAM: VS:  BP 136/60 mmHg  Pulse 57  Ht 5\' 6"  (1.676 m)  Wt 232 lb 6.4 oz (105.416 kg)  BMI 37.53 kg/m2 Well nourished, well developed, in no acute distress HEENT: normal Neck: no JVD Cardiac:  normal S1, S2; RRR; no murmur Lungs:  clear to auscultation bilaterally, no wheezing, rhonchi or rales Abd: soft, nontender, no hepatomegaly Ext: no edema Skin: warm and dry Neuro:  CNs 2-12 intact, no focal abnormalities noted  EKG:     Atrial fibrillation with SVR - HR 57bpm  ASSESSMENT AND PLAN:  1. ASCAD with some episodes of chest pain although she is very vague in her description.  She also has a hiatal hernia which could be causing problems.  She is on Prilosec. - Continue Isosorbide and ASA  2. HTN - well controlled - continue Cardizem/Cozaar  3. Dyslipidemia - LDL was not at goal in August (80) - continue simvastatin due to cost issues.  - she has repeat labs scheduled for February 4. Chronic atrial fibrillation - continue warfarin/Diltiazem 5. Chronic anticoagulation 6. Chronic diastolic CHF - appears compensated - continue Lasix/Cardizem 7. Moderate pulmonary HTN most likely secondary to diastolic CHF/HTN.  Chest CT showed no pulmonary embolism. PFTs showed mild COPD.  Patient refused sleep study. Repeat echo ordered for 03/2015.  She has agree to proceed with sleep study to rule out sleep apnea. 8.  SOB with exertion.  She says that she is having a problem walking any significant distance. I will get a Lexiscan myoview to rule out ischemia and 2D echo to reassess LVF  Followup with me in 6 months   Signed, Fransico Him, MD [facility] 08/15/2014 10:05 AM

## 2014-08-15 NOTE — Addendum Note (Signed)
Addended by: Harland German A on: 08/15/2014 11:27 AM   Modules accepted: Orders, Medications

## 2014-08-27 ENCOUNTER — Telehealth: Payer: Self-pay | Admitting: Cardiology

## 2014-08-27 DIAGNOSIS — I272 Pulmonary hypertension, unspecified: Secondary | ICD-10-CM

## 2014-08-27 NOTE — Telephone Encounter (Signed)
Patient did agree to ECHO only, which is scheduled for tomorrow.  To Dr. [name] for review.

## 2014-08-27 NOTE — Telephone Encounter (Signed)
pt refused the nucler stress test and split night sleep study at this time, she said she Brocious do them in the spring she would let us know

## 2014-08-28 ENCOUNTER — Ambulatory Visit (HOSPITAL_COMMUNITY): Payer: Medicare Other | Attending: Cardiovascular Disease | Admitting: Radiology

## 2014-08-28 DIAGNOSIS — R0602 Shortness of breath: Secondary | ICD-10-CM

## 2014-08-28 DIAGNOSIS — I10 Essential (primary) hypertension: Secondary | ICD-10-CM | POA: Insufficient documentation

## 2014-08-28 DIAGNOSIS — E785 Hyperlipidemia, unspecified: Secondary | ICD-10-CM | POA: Insufficient documentation

## 2014-08-28 NOTE — Progress Notes (Signed)
Echocardiogram performed.  

## 2014-09-03 ENCOUNTER — Telehealth: Payer: Self-pay

## 2014-09-03 NOTE — Telephone Encounter (Signed)
Returned pt call re: labs from 12/3.  Let pt know that her labs were good.  Pt voiced understanding.

## 2014-09-04 NOTE — Telephone Encounter (Signed)
Patient informed of results and verbal understanding expressed.  Repeat ECHO ordered for Dec 2016.

## 2014-09-04 NOTE — Telephone Encounter (Signed)
-----   Message from [name], MD sent at 09/02/2014  3:23 PM EST ----- Please let patient know that echo showed normal LVF with mild AS, midly leaky MV and mild pulmonary HTN - repeat echo in 1 year for pulmonary HTN

## 2014-09-11 ENCOUNTER — Ambulatory Visit (INDEPENDENT_AMBULATORY_CARE_PROVIDER_SITE_OTHER): Payer: Medicare Other | Admitting: Physician Assistant

## 2014-09-11 ENCOUNTER — Encounter: Payer: Self-pay | Admitting: Physician Assistant

## 2014-09-11 VITALS — BP 144/84 | HR 68 | Temp 98.3°F | Resp 18 | Wt 234.0 lb

## 2014-09-11 DIAGNOSIS — Z7901 Long term (current) use of anticoagulants: Secondary | ICD-10-CM

## 2014-09-11 DIAGNOSIS — B372 Candidiasis of skin and nail: Secondary | ICD-10-CM

## 2014-09-11 DIAGNOSIS — I482 Chronic atrial fibrillation, unspecified: Secondary | ICD-10-CM

## 2014-09-11 LAB — PT WITH INR/FINGERSTICK
INR, fingerstick: 1.7 — ABNORMAL HIGH (ref 0.80–1.20)
PT, fingerstick: 20.3 seconds — ABNORMAL HIGH (ref 10.4–12.5)

## 2014-09-11 MED ORDER — NYSTATIN 100000 UNIT/GM EX CREA: 1.0000 "application " | TOPICAL_CREAM | Freq: Two times a day (BID) | CUTANEOUS | Status: DC

## 2014-09-12 NOTE — Progress Notes (Signed)
Patient ID: [name] MRN: [id number], DOB: [birth year], 80 y.o. Date of Encounter: 09/12/2014, 7:53 AM    Chief Complaint:  Chief Complaint  Patient presents with  . severe chafing in groin area  . PT/INR check     HPI: 80 y.o. year old white female resents for above.  Says that she developed rash in her groin-- in the skin crease bilaterally. Says that  she started applying some over-the-counter "salve". This made it worse--has gotten even more red, itchy and irritated.   She is still taking the same Coumadin dose. Her INRs are always therapeutic and she has not needed to adjust the dose and a very long time. Says that she has not been on any antibiotics or any other short-term medications recently that could be affecting INR. Says that she has not accidentally skipped any doses. Says that she has to eat eaten some greens recently. No large quantities of vitamin K containing foods.     Home Meds:   Outpatient Prescriptions Prior to Visit  Medication Sig Dispense Refill  . acetaminophen (TYLENOL) 325 MG tablet Take 650 mg by mouth every 6 (six) hours as needed for mild pain.    . Cholecalciferol (VITAMIN D) 2000 UNITS CAPS Take 1 capsule by mouth daily.    Marland Kitchen diltiazem (CARDIZEM CD) 180 MG 24 hr capsule Take 1 capsule (180 mg total) by mouth daily. 90 capsule 3  . famotidine (PEPCID) 20 MG tablet Take one tablet 1 hour prior to CT 1 tablet 0  . furosemide (LASIX) 20 MG tablet Take 1 tablet (20 mg total) by mouth daily. 90 tablet 1  . isosorbide dinitrate (ISORDIL) 40 MG tablet Take 1 tablet (40 mg total) by mouth 2 (two) times daily. 180 tablet 1  . losartan (COZAAR) 100 MG tablet Take 1 tablet (100 mg total) by mouth daily. 90 tablet 3  . nitroGLYCERIN (NITROSTAT) 0.4 MG SL tablet Place 1 tablet (0.4 mg total) under the tongue every 5 (five) minutes as needed for chest pain. 50 tablet 3  . omeprazole (PRILOSEC) 20 MG capsule Take 20 mg by mouth daily as needed (heartburn, acid  reflux).    Marland Kitchen OVER THE COUNTER MEDICATION Place 1 drop into both eyes daily as needed (dry eyes). Eye drops    . simvastatin (ZOCOR) 10 MG tablet Take 1 tablet (10 mg total) by mouth at bedtime. 90 tablet 3  . triamcinolone (KENALOG) 0.025 % ointment Apply 1 application topically 2 (two) times daily. 30 g 1  . warfarin (COUMADIN) 1 MG tablet Take 1 mg by mouth See admin instructions. Take half a tablet (0.5 mg) with half a tablet of 5 mg (total 3 mg) on all other days (Not Tuesdays, Thursdays, and Sundays).    . warfarin (COUMADIN) 5 MG tablet Take 0.5 tablets (2.5 mg total) by mouth See admin instructions. Take half a tablet (2.5 mg) on Tuesdays, Thursdays, and Sundays. Take half a tablet with half a tablet of 1 mg (total 3 mg) on all other days. 90 tablet 3   No facility-administered medications prior to visit.    Allergies:  Allergies  Allergen Reactions  . Contrast Media [Iodinated Diagnostic Agents] Other (See Comments)    Per pt strong burning sensation starting in chest radiating outward  . Statins Other (See Comments)    "bones hurt"  . Celebrex [Celecoxib] Rash  . Tape Itching and Rash    Red Where applied and will spread  Review of Systems: See HPI for pertinent ROS. All other ROS negative.    Physical Exam: Blood pressure 144/84, pulse 68, temperature 98.3 F (36.8 C), temperature source Oral, resp. rate 18, weight 234 lb (106.142 kg)., Body mass index is 37.79 kg/(m^2). General:  Obese WF. Appears in no acute distress. Neck: Supple. No thyromegaly. No lymphadenopathy. Lungs: Clear bilaterally to auscultation without wheezes, rales, or rhonchi. Breathing is unlabored. Heart: Irregular rhythm.  Msk:  Strength and tone normal for age. Extremities/Skin: Warm and dry. Bilateral inner groin with diffuse pink erythema--covering entire area of groins, extending approx 2 inches from groin crease and extending toward suprapubic region.  Neuro: Alert and oriented X 3. Moves  all extremities spontaneously. Gait is normal. CNII-XII grossly in tact. Psych:  Responds to questions appropriately with a normal affect.   Results for orders placed or performed in visit on 09/11/14  PT with INR/Fingerstick  Result Value Ref Range   PT, fingerstick 20.3 (H) 10.4 - 12.5 seconds   INR, fingerstick 1.7 (H) 0.80 - 1.20     ASSESSMENT AND PLAN:  80 y.o. year old female with  1. Long term current use of anticoagulant therapy - PT with INR/Fingerstick  Her INRs are always therapeutic. This one barely subtherapeutic. Will not change dose. Cont current dose. Recheck 2 weeks.   2. Chronic atrial fibrillation  3. Chronic anticoagulation  4. Candidiasis, cutaneous Exam is consistent with the original issue being candidiasis.  However I think that the topical medication that she applied then called caused some allergic/contact dermatitis in addition. - nystatin cream (MYCOSTATIN); Apply 1 application topically 2 (two) times daily.  Dispense: 30 g; Refill: 0   Signed, [address] [place], [place], [facility] 09/12/2014 7:53 AM

## 2014-10-14 ENCOUNTER — Telehealth: Payer: Self-pay | Admitting: Cardiology

## 2014-10-14 NOTE — Telephone Encounter (Signed)
New message     Need prior authorization for isosorbide 40mg -----pt want it changed to something else on the list.   Please call prime mail.  Please call pt and let her know what we are going to do.

## 2014-10-14 NOTE — Telephone Encounter (Signed)
Follow up     Patient forgot to tell us it is [employer].

## 2014-10-16 ENCOUNTER — Telehealth: Payer: Self-pay

## 2014-10-16 NOTE — Telephone Encounter (Signed)
Patient st she cannot afford isosorbide. Attempted to call [employer] [phone] for assistance but the call center is down.  Will try again later.

## 2014-10-18 NOTE — Telephone Encounter (Signed)
Can you please research why her insurance will not pay for this

## 2014-10-23 NOTE — Telephone Encounter (Signed)
Spoke to [employer] who st that no PA is required, but a tier exception is required.  Supplied fax number to representative to send paper work to be filled out.

## 2014-10-23 NOTE — Telephone Encounter (Signed)
Document received, completed and placed in Medical Records "to be faxed" bin.

## 2014-10-24 ENCOUNTER — Telehealth: Payer: Self-pay | Admitting: Cardiology

## 2014-10-24 NOTE — Telephone Encounter (Signed)
New message      Isosorbide has been approved for 78yr effective 10-23-2014

## 2014-10-24 NOTE — Telephone Encounter (Signed)
Left message for patient to call back  

## 2014-10-25 MED ORDER — METOPROLOL SUCCINATE ER 25 MG PO TB24: 25.0000 mg | ORAL_TABLET | Freq: Every day | ORAL | Status: DC

## 2014-10-25 NOTE — Telephone Encounter (Signed)
Change cardizem to Toprol XL 25mg  daily and have her check her BP daily for a week and call.

## 2014-10-25 NOTE — Telephone Encounter (Signed)
Follow up ° ° ° ° °Returning a nurses call from yesterday °

## 2014-10-25 NOTE — Telephone Encounter (Signed)
Patient refuses to pay $80 per month for isosorbide.  She is requesting a different medication prescribed.  To Dr. [name].

## 2014-10-25 NOTE — Telephone Encounter (Signed)
Per Dr. [name], instructed patient to: 1) STOP ISOSORBIDE 2) STOP CARDIZEM 3) START TOPROL XL 25 mg daily 4) Check BP and HR daily for a week and call with results.  Patient agrees with treatment plan.

## 2014-10-29 ENCOUNTER — Telehealth: Payer: Self-pay | Admitting: Cardiology

## 2014-10-29 ENCOUNTER — Ambulatory Visit: Payer: Medicare Other | Admitting: Family Medicine

## 2014-10-29 MED ORDER — METOPROLOL SUCCINATE ER 50 MG PO TB24: 50.0000 mg | ORAL_TABLET | Freq: Every day | ORAL | Status: DC

## 2014-10-29 NOTE — Telephone Encounter (Signed)
New message    Pt C/O BP issue: STAT if pt C/O blurred vision, one-sided weakness or slurred speech  1. What are your last 5 BP readings? Today @ 8 am 200/69 pulse 69. A few minutes ago - 217/??    I ask the patient to take her blood pressure again 223/76 pulse 80   2. Are you having any other symptoms (ex. Dizziness, headache, blurred vision, passed out)? Dizzy   3. What is your BP issue? Medication issues

## 2014-10-29 NOTE — Telephone Encounter (Signed)
Patient calling in with elevated BP (223/76) and mild dizziness. States she noted elevated BP last night (200/69).  Heart rate regular at 80. Denies headache but does say her head feels "tight" but just a little like she is getting a head cold. No tingling, blurred vision or neurological symptoms. States she has not taken Lasix the last few days due to "my stomach has been upset and my water has been running through me"/loose stools. Patient has not taken metoprolol today yet. Instructed patient to take metoprolol, recheck BP and triage nurse will call her back in 5 minutes.   Called patient back in 5 minutes. Patient has now voided and taken her metoprolol. BP is now  217/82, 78. Dr. [name] reviewed information and advises that patient increase Toprol XL 50 mg 24 hr tablet by mouth daily. Patient needs to monitor BP daily and call back if BP continues above 160/90 or if she is symptomatic. Patient verbalized understanding and agreement with treatment care plan. She will go ahead and taken an additional 25 mg of Toprol XL today for a total dose of 50 mg Toprol XL daily.  Educated patient on importance of taking BP daily and also taking medications as prescribed without skipping doses.

## 2014-10-30 ENCOUNTER — Ambulatory Visit (INDEPENDENT_AMBULATORY_CARE_PROVIDER_SITE_OTHER): Payer: Medicare Other | Admitting: Physician Assistant

## 2014-10-30 ENCOUNTER — Encounter: Payer: Self-pay | Admitting: Physician Assistant

## 2014-10-30 VITALS — BP 150/82 | HR 60 | Temp 98.1°F | Resp 18 | Wt 228.0 lb

## 2014-10-30 DIAGNOSIS — Z7901 Long term (current) use of anticoagulants: Secondary | ICD-10-CM

## 2014-10-30 DIAGNOSIS — R197 Diarrhea, unspecified: Secondary | ICD-10-CM

## 2014-10-30 LAB — PT WITH INR/FINGERSTICK
INR FINGERSTICK: 2.1 — AB (ref 0.80–1.20)
PT FINGERSTICK: 25.2 s — AB (ref 10.4–12.5)

## 2014-10-30 NOTE — Progress Notes (Signed)
Patient ID: [name] MRN: [id number], DOB: 1934-10-13, 80 y.o. Date of Encounter: 10/30/2014, 3:39 PM    Chief Complaint:  Chief Complaint  Patient presents with  . c/o diarrhea for long time    very gassy  . PT/INR     HPI: 80 y.o. year old female here for the above.  Here to recheck her PT/INR. Taking Coumadin as directed. No skipped doses. No bleeding.   Patient says that she has had problems with diarrhea "for a long time ". I keep asking her how long. Finally she says that she knows ever since she had her colonoscopy which was 2 or 3 years ago. Says that on some days she'll go ahead and take 1 Imodium first thing in the morning. Says that things will be good for that day but says that then the next day she will be on the constipated side. Says that she never takes more than one Imodium per day. Says that she'll usually take Imodium on Sundays when she goes to church. On other days if she goes to a store or something she is having to run to the bathroom because of diarrhea. Also says that she has a lot of gas. Says that sometimes when she is just walking the gas comes out and it's embarrassing. Says that she has used Gas-X with no improvement in symptoms.     Home Meds:   Outpatient Prescriptions Prior to Visit  Medication Sig Dispense Refill  . acetaminophen (TYLENOL) 325 MG tablet Take 650 mg by mouth every 6 (six) hours as needed for mild pain.    . Cholecalciferol (VITAMIN D) 2000 UNITS CAPS Take 1 capsule by mouth daily.    . furosemide (LASIX) 20 MG tablet Take 1 tablet (20 mg total) by mouth daily. 90 tablet 1  . losartan (COZAAR) 100 MG tablet Take 1 tablet (100 mg total) by mouth daily. 90 tablet 3  . metoprolol succinate (TOPROL-XL) 50 MG 24 hr tablet Take 1 tablet (50 mg total) by mouth daily. Take with or immediately following a meal. 90 tablet 3  . nitroGLYCERIN (NITROSTAT) 0.4 MG SL tablet Place 1 tablet (0.4 mg total) under the tongue every 5 (five) minutes  as needed for chest pain. 50 tablet 3  . nystatin cream (MYCOSTATIN) Apply 1 application topically 2 (two) times daily. 30 g 0  . omeprazole (PRILOSEC) 20 MG capsule Take 20 mg by mouth daily as needed (heartburn, acid reflux).    Marland Kitchen OVER THE COUNTER MEDICATION Place 1 drop into both eyes daily as needed (dry eyes). Eye drops    . simvastatin (ZOCOR) 10 MG tablet Take 1 tablet (10 mg total) by mouth at bedtime. 90 tablet 3  . warfarin (COUMADIN) 1 MG tablet Take 1 mg by mouth See admin instructions. Take half a tablet (0.5 mg) with half a tablet of 5 mg (total 3 mg) on all other days (Not Tuesdays, Thursdays, and Sundays).    . warfarin (COUMADIN) 5 MG tablet Take 0.5 tablets (2.5 mg total) by mouth See admin instructions. Take half a tablet (2.5 mg) on Tuesdays, Thursdays, and Sundays. Take half a tablet with half a tablet of 1 mg (total 3 mg) on all other days. 90 tablet 3  . famotidine (PEPCID) 20 MG tablet Take one tablet 1 hour prior to CT 1 tablet 0  . triamcinolone (KENALOG) 0.025 % ointment Apply 1 application topically 2 (two) times daily. 30 g 1   No facility-administered  medications prior to visit.    Allergies:  Allergies  Allergen Reactions  . Contrast Media [Iodinated Diagnostic Agents] Other (See Comments)    Per pt strong burning sensation starting in chest radiating outward  . Statins Other (See Comments)    "bones hurt"  . Celebrex [Celecoxib] Rash  . Tape Itching and Rash    Red Where applied and will spread      Review of Systems: See HPI for pertinent ROS. All other ROS negative.    Physical Exam: Blood pressure 150/82, pulse 60, temperature 98.1 F (36.7 C), resp. rate 18, weight 228 lb (103.42 kg)., Body mass index is 36.82 kg/(m^2). General:  Obese WF. Appears in no acute distress. Neck: Supple. No thyromegaly. No lymphadenopathy. Lungs: Clear bilaterally to auscultation without wheezes, rales, or rhonchi. Breathing is unlabored. Heart: Irregular Abdomen:  Soft, non-tender, non-distended with normoactive bowel sounds. No hepatomegaly. No rebound/guarding. No obvious abdominal masses. Msk:  Strength and tone normal for age. Extremities/Skin: Warm and dry. Neuro: Alert and oriented X 3. Moves all extremities spontaneously. Gait is normal. CNII-XII grossly in tact. Psych:  Responds to questions appropriately with a normal affect.     ASSESSMENT AND PLAN:  80 y.o. year old female with  1. Long term current use of anticoagulant therapy - PT with INR/Fingerstick Results for orders placed or performed in visit on 10/30/14  PT with INR/Fingerstick  Result Value Ref Range   PT, fingerstick 25.2 (H) 10.4 - 12.5 seconds   INR, fingerstick 2.1 (H) 0.80 - 1.20   continue current dose of Coumadin. Recheck PT/INR 4 weeks.  2. Diarrhea I recommend that she take Imodium every other day--or on Monday Wednesday Friday of this is easier to keep up with this dosing. I recommend that she take probiotic daily. Recommend that she use Gas-X on a routine basis.  There is a newer prescription medication to use for diarrhea but this is expensive and is not covered by Medicare. If she did the above medication treatment on a regular basis that this would at least keep the symptoms more manageable.   [address] [place], [place], [facility] 10/30/2014 3:39 PM

## 2014-10-31 ENCOUNTER — Other Ambulatory Visit (INDEPENDENT_AMBULATORY_CARE_PROVIDER_SITE_OTHER): Payer: Medicare Other | Admitting: *Deleted

## 2014-10-31 DIAGNOSIS — E785 Hyperlipidemia, unspecified: Secondary | ICD-10-CM

## 2014-10-31 LAB — LIPID PANEL
CHOL/HDL RATIO: 3
Cholesterol: 161 mg/dL (ref 0–200)
HDL: 53.7 mg/dL (ref >39.00)
LDL Cholesterol: 93 mg/dL (ref 0–99)
NonHDL: 107.3
TRIGLYCERIDES: 72 mg/dL (ref 0.0–149.0)
VLDL: 14.4 mg/dL (ref 0.0–40.0)

## 2014-10-31 LAB — HEPATIC FUNCTION PANEL
ALBUMIN: 4.1 g/dL (ref 3.5–5.2)
ALT: 17 U/L (ref 0–35)
AST: 20 U/L (ref 0–37)
Alkaline Phosphatase: 93 U/L (ref 39–117)
BILIRUBIN DIRECT: 0.1 mg/dL (ref 0.0–0.3)
TOTAL PROTEIN: 7.9 g/dL (ref 6.0–8.3)
Total Bilirubin: 0.7 mg/dL (ref 0.2–1.2)

## 2014-10-31 NOTE — Addendum Note (Signed)
Addended by: Andres Ege on: 10/31/2014 10:09 AM   Modules accepted: Orders

## 2014-11-06 ENCOUNTER — Telehealth: Payer: Self-pay

## 2014-11-06 DIAGNOSIS — E785 Hyperlipidemia, unspecified: Secondary | ICD-10-CM

## 2014-11-06 MED ORDER — SIMVASTATIN 10 MG PO TABS: 10.0000 mg | ORAL_TABLET | ORAL | Status: DC

## 2014-11-06 NOTE — Telephone Encounter (Signed)
Patient st she has only been taking her simvastatin 2 times a week and refuses to increase the dosage because it makes her muscles hurt so badly. Instructed patient to take her simvastatin every other day instead.  Fasting lab work scheduled for 4/20. Patient agrees with treatment plan.

## 2014-11-07 ENCOUNTER — Encounter: Payer: Self-pay | Admitting: Physician Assistant

## 2014-11-13 ENCOUNTER — Other Ambulatory Visit: Payer: Self-pay | Admitting: Physician Assistant

## 2014-11-13 NOTE — Telephone Encounter (Signed)
Medication refilled per protocol. 

## 2014-11-14 ENCOUNTER — Encounter: Payer: Self-pay | Admitting: Cardiology

## 2014-11-20 ENCOUNTER — Telehealth: Payer: Self-pay | Admitting: Family Medicine

## 2014-11-20 DIAGNOSIS — I10 Essential (primary) hypertension: Secondary | ICD-10-CM

## 2014-11-20 DIAGNOSIS — I4891 Unspecified atrial fibrillation: Secondary | ICD-10-CM

## 2014-11-20 MED ORDER — WARFARIN SODIUM 5 MG PO TABS: 2.5000 mg | ORAL_TABLET | ORAL | Status: DC

## 2014-11-20 MED ORDER — LOSARTAN POTASSIUM 100 MG PO TABS: 100.0000 mg | ORAL_TABLET | Freq: Every day | ORAL | Status: DC

## 2014-11-20 NOTE — Telephone Encounter (Signed)
Medication refilled per protocol. 

## 2014-11-21 ENCOUNTER — Telehealth: Payer: Self-pay | Admitting: Physician Assistant

## 2014-11-21 NOTE — Telephone Encounter (Signed)
Called pharmacy and clarified the prescription that was sent by vm

## 2014-11-21 NOTE — Telephone Encounter (Signed)
[facility] calling to clarify instructions about a rx that was called in for this patient  [phone]

## 2014-11-27 ENCOUNTER — Telehealth: Payer: Self-pay | Admitting: Family Medicine

## 2014-11-27 DIAGNOSIS — I4891 Unspecified atrial fibrillation: Secondary | ICD-10-CM

## 2014-11-27 DIAGNOSIS — I10 Essential (primary) hypertension: Secondary | ICD-10-CM

## 2014-11-27 MED ORDER — WARFARIN SODIUM 5 MG PO TABS: 2.5000 mg | ORAL_TABLET | ORAL | Status: DC

## 2014-11-27 MED ORDER — LOSARTAN POTASSIUM 100 MG PO TABS: 100.0000 mg | ORAL_TABLET | Freq: Every day | ORAL | Status: DC

## 2014-11-27 NOTE — Telephone Encounter (Signed)
Medication refilled per protocol. 

## 2014-12-24 ENCOUNTER — Other Ambulatory Visit (INDEPENDENT_AMBULATORY_CARE_PROVIDER_SITE_OTHER): Payer: Medicare Other | Admitting: *Deleted

## 2014-12-24 DIAGNOSIS — E785 Hyperlipidemia, unspecified: Secondary | ICD-10-CM

## 2014-12-24 LAB — LIPID PANEL
CHOLESTEROL: 150 mg/dL (ref 0–200)
HDL: 64.5 mg/dL (ref >39.00)
LDL Cholesterol: 70 mg/dL (ref 0–99)
NonHDL: 85.5
Total CHOL/HDL Ratio: 2
Triglycerides: 80 mg/dL (ref 0.0–149.0)
VLDL: 16 mg/dL (ref 0.0–40.0)

## 2014-12-24 LAB — ALT: ALT: 35 U/L (ref 0–35)

## 2014-12-25 ENCOUNTER — Other Ambulatory Visit: Payer: Medicare Other

## 2015-01-15 ENCOUNTER — Encounter: Payer: Self-pay | Admitting: Cardiology

## 2015-01-15 ENCOUNTER — Other Ambulatory Visit: Payer: Self-pay | Admitting: Gastroenterology

## 2015-01-27 ENCOUNTER — Ambulatory Visit (INDEPENDENT_AMBULATORY_CARE_PROVIDER_SITE_OTHER): Payer: Medicare Other | Admitting: Cardiology

## 2015-01-27 ENCOUNTER — Encounter: Payer: Self-pay | Admitting: Cardiology

## 2015-01-27 VITALS — BP 110/58 | HR 50 | Ht 66.0 in | Wt 227.2 lb

## 2015-01-27 DIAGNOSIS — I10 Essential (primary) hypertension: Secondary | ICD-10-CM

## 2015-01-27 DIAGNOSIS — I77810 Thoracic aortic ectasia: Secondary | ICD-10-CM

## 2015-01-27 DIAGNOSIS — E785 Hyperlipidemia, unspecified: Secondary | ICD-10-CM

## 2015-01-27 DIAGNOSIS — I2089 Other forms of angina pectoris: Secondary | ICD-10-CM

## 2015-01-27 DIAGNOSIS — I270 Primary pulmonary hypertension: Secondary | ICD-10-CM

## 2015-01-27 DIAGNOSIS — I2510 Atherosclerotic heart disease of native coronary artery without angina pectoris: Secondary | ICD-10-CM

## 2015-01-27 DIAGNOSIS — I272 Pulmonary hypertension, unspecified: Secondary | ICD-10-CM

## 2015-01-27 DIAGNOSIS — I208 Other forms of angina pectoris: Secondary | ICD-10-CM

## 2015-01-27 DIAGNOSIS — I482 Chronic atrial fibrillation, unspecified: Secondary | ICD-10-CM

## 2015-01-27 DIAGNOSIS — R0602 Shortness of breath: Secondary | ICD-10-CM

## 2015-01-27 DIAGNOSIS — I2583 Coronary atherosclerosis due to lipid rich plaque: Secondary | ICD-10-CM

## 2015-01-27 LAB — BASIC METABOLIC PANEL
BUN: 11 mg/dL (ref 6–23)
CALCIUM: 9.9 mg/dL (ref 8.4–10.5)
CO2: 30 meq/L (ref 19–32)
Chloride: 103 mEq/L (ref 96–112)
Creatinine, Ser: 0.67 mg/dL (ref 0.40–1.20)
GFR: 90.44 mL/min (ref >60.00)
GLUCOSE: 95 mg/dL (ref 70–99)
POTASSIUM: 4.1 meq/L (ref 3.5–5.1)
Sodium: 137 mEq/L (ref 135–145)

## 2015-01-27 MED ORDER — NITROGLYCERIN 0.4 MG SL SUBL
0.4000 mg | SUBLINGUAL_TABLET | SUBLINGUAL | Status: DC | PRN
Start: 2015-01-27 — End: 2015-09-05

## 2015-01-27 NOTE — Progress Notes (Signed)
Cardiology Office Note   Date:  01/27/2015   ID:  [name], DOB 1935/03/28, MRN [id number]  PCP:  Karis Juba, PA-C  Cardiologist:   [name], MD   Chief Complaint  Patient presents with  . Follow-up    CAD      History of Present Illness: [name] is a 80 y.o. female with a history of CAD, HTN, chronic diastolic CHF, dyslipidemia, mild pulmonary HTN, mild AS, mildly dilated aortic root and chronic AF. When I last saw her she complained of SOB and a stress test was ordered but she refused it.  Her 2D echo showed normal LVF with mild AS and mild pulmonary HTN.  She is doing well. She denies any chest pain or pressure, dizziness, palpitations or syncope. She has chronic SOB which she thinks is stable and actually improved. She has chronic LE edema which is stable.     Past Medical History  Diagnosis Date  . Cancer     right colon and left breast  . Breast cancer 01/06/2012  . Colon cancer 01/06/2012  . Hernia   . GERD (gastroesophageal reflux disease)   . Hypertension   . Allergy     rhinitis  . Osteopenia   . Diverticulosis   . Pneumonia     hx child  . Hiatal hernia     denies  . Arthritis   . Coronary artery disease 2006    nonobstructive with 20-30% ostial LAD and LM  . Atrial fibrillation     chronic atrial fibrillation  . Edema extremities   . Hyperlipidemia   . COPD (chronic obstructive pulmonary disease)   . Dilated aortic root   . Pulmonary HTN     moderate PASP 33mmHg    Past Surgical History  Procedure Laterality Date  . Colectomy      right side  . Mastectomy partial / lumpectomy  2008    left breast  . Cardiac catheterization    . Breast surgery      lumpectomy left  . Eye surgery Bilateral 12    cataracts  . Appendectomy    . Cholecystectomy    . Excision of accessory nipple Bilateral 05/30/2013    Procedure: BILATERAL NIPPLE BIOPSY;  Surgeon: Merrie Roof, MD;  Location: [facility];  Service: General;  Laterality: Bilateral;      Current Outpatient Prescriptions  Medication Sig Dispense Refill  . acetaminophen (TYLENOL) 325 MG tablet Take 650 mg by mouth every 6 (six) hours as needed for mild pain.    . Cholecalciferol (VITAMIN D) 2000 UNITS CAPS Take 1 capsule by mouth daily.    . furosemide (LASIX) 20 MG tablet Take 1 tablet (20 mg total) by mouth daily. 90 tablet 1  . losartan (COZAAR) 100 MG tablet Take 1 tablet (100 mg total) by mouth daily. 90 tablet 3  . metoprolol succinate (TOPROL-XL) 50 MG 24 hr tablet Take 1 tablet (50 mg total) by mouth daily. Take with or immediately following a meal. 90 tablet 3  . nitroGLYCERIN (NITROSTAT) 0.4 MG SL tablet Place 1 tablet (0.4 mg total) under the tongue every 5 (five) minutes as needed for chest pain. 25 tablet 1  . nystatin cream (MYCOSTATIN) Apply 1 application topically 2 (two) times daily. 30 g 0  . omeprazole (PRILOSEC) 20 MG capsule Take 20 mg by mouth daily as needed (heartburn, acid reflux).    Marland Kitchen OVER THE COUNTER MEDICATION Place 1 drop into both  eyes daily as needed (dry eyes). Eye drops    . simvastatin (ZOCOR) 10 MG tablet Take 1 tablet (10 mg total) by mouth every other day. 90 tablet 3  . warfarin (COUMADIN) 1 MG tablet Take 1 mg by mouth See admin instructions. Take half a tablet (0.5 mg) with half a tablet of 5 mg (total 3 mg) on all other days (Not Tuesdays, Thursdays, and Sundays).    . warfarin (COUMADIN) 5 MG tablet Take 0.5 tablets (2.5 mg total) by mouth See admin instructions. Take half a tablet (2.5 mg) on Tuesdays, Thursdays, and Sundays. Take half a tablet with half a tablet of 1 mg (total 3 mg) on all other days. 90 tablet 3   No current facility-administered medications for this visit.    Allergies:   Contrast media; Statins; Celebrex; and Tape    Social History:  The patient  reports that she has never smoked. She has never used smokeless tobacco. She reports that she does not drink alcohol or use illicit drugs.   Family History:  The  patient's family history includes Cancer in her sister and sister; Heart disease in her mother; Heart disease (age of onset: 39) in her brother.    ROS:  Please see the history of present illness.   Otherwise, review of systems are positive for none.   All other systems are reviewed and negative.    PHYSICAL EXAM: VS:  BP 110/58 mmHg  Pulse 50  Ht 5\' 6"  (1.676 m)  Wt 227 lb 3.2 oz (103.057 kg)  BMI 36.69 kg/m2  SpO2 98% , BMI Body mass index is 36.69 kg/(m^2). GEN: Well nourished, well developed, in no acute distress HEENT: normal Neck: no JVD, carotid bruits, or masses Cardiac: RRR; no murmurs, rubs, or gallops,no edema  Respiratory:  clear to auscultation bilaterally, normal work of breathing GI: soft, nontender, nondistended, + BS MS: no deformity or atrophy Skin: warm and dry, no rash Neuro:  Strength and sensation are intact Psych: euthymic mood, full affect   EKG:  EKG is not ordered today.    Recent Labs: 08/08/2014: BUN 14.4; Creatinine 0.8; Hemoglobin 12.4; Platelets 243; Potassium 4.2; Sodium 140 12/24/2014: ALT 35    Lipid Panel    Component Value Date/Time   CHOL 150 12/24/2014 1045   TRIG 80.0 12/24/2014 1045   HDL 64.50 12/24/2014 1045   CHOLHDL 2 12/24/2014 1045   VLDL 16.0 12/24/2014 1045   LDLCALC 70 12/24/2014 1045      Wt Readings from Last 3 Encounters:  01/27/15 227 lb 3.2 oz (103.057 kg)  10/30/14 228 lb (103.42 kg)  09/11/14 234 lb (106.142 kg)    ASSESSMENT AND PLAN:        1.  ASCAD with no chest pain  - Continue  ASA  2. HTN - well controlled - continue Cozaar/BB  3. Dyslipidemia - LDL was not at goal in August (80) - continue simvastatin due to cost issues.  4. Chronic atrial fibrillation - continue warfarin//BB 5. Chronic anticoagulation 6. Chronic diastolic CHF - appears compensated - continue Lasix/BB/ARB 7. Mild pulmonary HTN most likely secondary to diastolic CHF/HTN. Chest CT showed no pulmonary embolism. PFTs showed  mild COPD. Patient refused sleep study. Repeat echo ordered for 08/2015.  8. SOB with exertion. She says that she is having a problem walking any significant distance. A Lexiscan myoview was ordered to rule out ischemia but patient refused. 2D echo showed normal LVF with mild AS 9.  Mild AS  on echo 10.  Asymptomatic bradycardia     Current medicines are reviewed at length with the patient today.  The patient does not have concerns regarding medicines.  The following changes have been made:  no change  Labs/ tests ordered today include: none   Orders Placed This Encounter  Procedures  . Basic metabolic panel     Disposition:   FU with me in 6 months  Signed, [name], MD  01/27/2015 1:34 PM    [facility] [address], [place], [place]  [address] Phone: [phone]; Fax: [phone]

## 2015-01-27 NOTE — Patient Instructions (Signed)
Medication Instructions:  Your physician recommends that you continue on your current medications as directed. Please refer to the Current Medication list given to you today.   Labwork: TODAY: BMET  Testing/Procedures: None  Follow-Up: Your physician wants you to follow-up in: 6 months with Dr. Turner. You will receive a reminder letter in the mail two months in advance. If you don't receive a letter, please call our office to schedule the follow-up appointment.   Any Other Special Instructions Will Be Listed Below (If Applicable).   

## 2015-01-28 ENCOUNTER — Telehealth: Payer: Self-pay | Admitting: Cardiology

## 2015-01-28 NOTE — Telephone Encounter (Signed)
Informed patient of results and verbal understanding expressed.  

## 2015-01-28 NOTE — Telephone Encounter (Signed)
New problem   Pt returning your call back from yesterday. Please leave a message if pt isn't home.

## 2015-01-28 NOTE — Telephone Encounter (Signed)
-----   Message from [name], MD sent at 01/27/2015  4:24 PM EDT ----- Please let patient know that labs were normal.  Continue current medical therapy.

## 2015-01-29 ENCOUNTER — Ambulatory Visit (INDEPENDENT_AMBULATORY_CARE_PROVIDER_SITE_OTHER): Payer: Medicare Other | Admitting: Physician Assistant

## 2015-01-29 ENCOUNTER — Encounter: Payer: Self-pay | Admitting: Physician Assistant

## 2015-01-29 VITALS — BP 144/84 | HR 60 | Temp 98.7°F | Resp 18 | Wt 226.0 lb

## 2015-01-29 DIAGNOSIS — J439 Emphysema, unspecified: Secondary | ICD-10-CM

## 2015-01-29 DIAGNOSIS — E785 Hyperlipidemia, unspecified: Secondary | ICD-10-CM | POA: Diagnosis not present

## 2015-01-29 DIAGNOSIS — I482 Chronic atrial fibrillation, unspecified: Secondary | ICD-10-CM

## 2015-01-29 DIAGNOSIS — Z0000 Encounter for general adult medical examination without abnormal findings: Secondary | ICD-10-CM | POA: Diagnosis not present

## 2015-01-29 DIAGNOSIS — M8580 Other specified disorders of bone density and structure, unspecified site: Secondary | ICD-10-CM

## 2015-01-29 DIAGNOSIS — I10 Essential (primary) hypertension: Secondary | ICD-10-CM | POA: Diagnosis not present

## 2015-01-29 DIAGNOSIS — I272 Pulmonary hypertension, unspecified: Secondary | ICD-10-CM

## 2015-01-29 DIAGNOSIS — I270 Primary pulmonary hypertension: Secondary | ICD-10-CM | POA: Diagnosis not present

## 2015-01-29 DIAGNOSIS — Z7901 Long term (current) use of anticoagulants: Secondary | ICD-10-CM

## 2015-01-29 LAB — PT WITH INR/FINGERSTICK
INR FINGERSTICK: 2.2 — AB (ref 0.80–1.20)
PT, fingerstick: 26.9 seconds — ABNORMAL HIGH (ref 10.4–12.5)

## 2015-01-29 NOTE — Progress Notes (Signed)
Patient ID: [name] MRN: [id number], DOB: 01-23-1935, 80 y.o. Date of Encounter: 01/29/2015,   Chief Complaint: Medicare Physical, Subsequent Visit   HPI: 80 y.o. y/o female  here for CPE.  Says her insurance company keeps calling,  telling her she needs to come in for a complete physical checkup. She reported the same thing when she came in for complete physical exam with me on 2014-04-01. That visit was coded as just a regular complete physical exam. Visit 2015/02/22 will be coded as just a Medicare physical.  She sees the following medical providers on a routine basis:  [facility]--- she goes there every 6 months.  Dr. Cristina Gong for GI--says she is to followup with him in 5 years  FYI: At her physical 03/2014 she reported that she also sawDr. Chancy Milroy at the [facility]--- she was going there every 6 months----today-01/2015--she states that they told her that she did not need to continue to follow-up there.   She states that she has been seeing Dr. Cristina Gong recently because she was having diarrhea and a lot of gas etc. Says that she just had a colonoscopy with him earlier this month of 02-22-23. Says that she has follow-up appointment with him tomorrow.  She is taking blood pressure medications as directed with no adverse effects.  She is taking cholesterol medications as directed with no myalgias or other adverse effects.  She is taking her Coumadin as directed. No skipped doses. No significant change in diet. Has seen no bleeding from the gums or other bleeding.  Review of Systems: Consitutional: No fever, chills, fatigue, night sweats, lymphadenopathy. No significant/unexplained weight changes. Eyes: No visual changes, eye redness, or discharge. ENT/Mouth: No ear pain, sore throat, nasal drainage, or sinus pain. Cardiovascular: No chest pressure,heaviness, tightness or squeezing, even with exertion. No increased shortness of breath or dyspnea on  exertion.No palpitations, edema, orthopnea, PND. Respiratory: No cough, hemoptysis, SOB, or wheezing. Gastrointestinal: No anorexia, dysphagia, reflux, pain, nausea, vomiting, hematemesis, diarrhea, constipation, BRBPR, or melena. Breast: No mass, nodules, bulging, or retraction. No skin changes or inflammation. No nipple discharge. No lymphadenopathy. Genitourinary: No dysuria, hematuria, incontinence, vaginal discharge, pruritis, burning, abnormal bleeding, or pain. Musculoskeletal: No decreased ROM, No joint pain or swelling. No significant pain in neck, back, or extremities. Skin: No rash, pruritis, or concerning lesions. Neurological: No headache, dizziness, syncope, seizures, tremors, memory loss, coordination problems, or paresthesias. Psychological: No anxiety, depression, hallucinations, SI/HI. Endocrine: No polydipsia, polyphagia, polyuria, or known diabetes.No increased fatigue. No palpitations/rapid heart rate. No significant/unexplained weight change. All other systems were reviewed and are otherwise negative.  Past Medical History  Diagnosis Date  . Cancer     right colon and left breast  . Breast cancer 01/06/2012  . Colon cancer 01/06/2012  . Hernia   . GERD (gastroesophageal reflux disease)   . Hypertension   . Allergy     rhinitis  . Osteopenia   . Diverticulosis   . Pneumonia     hx child  . Hiatal hernia     denies  . Arthritis   . Coronary artery disease 2006    nonobstructive with 20-30% ostial LAD and LM  . Atrial fibrillation     chronic atrial fibrillation  . Edema extremities   . Hyperlipidemia   . COPD (chronic obstructive pulmonary disease)   . Dilated aortic root   . Pulmonary HTN     moderate PASP 79mmHg  Past Surgical History  Procedure Laterality Date  . Colectomy      right side  . Mastectomy partial / lumpectomy  2008    left breast  . Cardiac catheterization    . Breast surgery      lumpectomy left  . Eye surgery Bilateral 12     cataracts  . Appendectomy    . Cholecystectomy    . Excision of accessory nipple Bilateral 05/30/2013    Procedure: BILATERAL NIPPLE BIOPSY;  Surgeon: Merrie Roof, MD;  Location: [facility];  Service: General;  Laterality: Bilateral;    Home Meds:  Outpatient Prescriptions Prior to Visit  Medication Sig Dispense Refill  . acetaminophen (TYLENOL) 325 MG tablet Take 650 mg by mouth every 6 (six) hours as needed for mild pain.    . Cholecalciferol (VITAMIN D) 2000 UNITS CAPS Take 1 capsule by mouth daily.    . furosemide (LASIX) 20 MG tablet Take 1 tablet (20 mg total) by mouth daily. 90 tablet 1  . losartan (COZAAR) 100 MG tablet Take 1 tablet (100 mg total) by mouth daily. 90 tablet 3  . metoprolol succinate (TOPROL-XL) 50 MG 24 hr tablet Take 1 tablet (50 mg total) by mouth daily. Take with or immediately following a meal. 90 tablet 3  . nitroGLYCERIN (NITROSTAT) 0.4 MG SL tablet Place 1 tablet (0.4 mg total) under the tongue every 5 (five) minutes as needed for chest pain. 25 tablet 1  . nystatin cream (MYCOSTATIN) Apply 1 application topically 2 (two) times daily. 30 g 0  . omeprazole (PRILOSEC) 20 MG capsule Take 20 mg by mouth daily as needed (heartburn, acid reflux).    Marland Kitchen OVER THE COUNTER MEDICATION Place 1 drop into both eyes daily as needed (dry eyes). Eye drops    . simvastatin (ZOCOR) 10 MG tablet Take 1 tablet (10 mg total) by mouth every other day. 90 tablet 3  . warfarin (COUMADIN) 1 MG tablet Take 1 mg by mouth See admin instructions. Take half a tablet (0.5 mg) with half a tablet of 5 mg (total 3 mg) on all other days (Not Tuesdays, Thursdays, and Sundays).    . warfarin (COUMADIN) 5 MG tablet Take 0.5 tablets (2.5 mg total) by mouth See admin instructions. Take half a tablet (2.5 mg) on Tuesdays, Thursdays, and Sundays. Take half a tablet with half a tablet of 1 mg (total 3 mg) on all other days. 90 tablet 3   No facility-administered medications prior to visit.    Allergies:    Allergies  Allergen Reactions  . Contrast Media [Iodinated Diagnostic Agents] Other (See Comments)    Per pt strong burning sensation starting in chest radiating outward  . Statins Other (See Comments)    "bones hurt"  . Celebrex [Celecoxib] Rash  . Tape Itching and Rash    Red Where applied and will spread    History   Social History  . Marital Status: Married    Spouse Name: N/A  . Number of Children: N/A  . Years of Education: N/A   Occupational History  . Not on file.   Social History Main Topics  . Smoking status: Never Smoker   . Smokeless tobacco: Never Used  . Alcohol Use: No  . Drug Use: No  . Sexual Activity: Not Currently   Other Topics Concern  . Not on file   Social History Narrative    Family History  Problem Relation Age of Onset  . Heart disease Mother   .  Cancer Sister     stomach and colon  . Heart disease Brother 49  . Cancer Sister     Physical Exam: Blood pressure 144/84, pulse 60, temperature 98.7 F (37.1 C), temperature source Oral, resp. rate 18, weight 226 lb (102.513 kg)., Body mass index is 36.49 kg/(m^2). General: Obese WF. Appears in no acute distress. HEENT: Normocephalic, atraumatic. Conjunctiva pink, sclera non-icteric. Pupils 2 mm constricting to 1 mm, round, regular, and equally reactive to light and accomodation. EOMI. Internal auditory canal clear. TMs with good cone of light and without pathology. Nasal mucosa pink. Nares are without discharge. No sinus tenderness. Oral mucosa pink.  Pharynx without exudate.   Neck: Supple. Trachea midline. No thyromegaly. Full ROM. No lymphadenopathy.No Carotid Bruits. Lungs: Clear to auscultation bilaterally without wheezes, rales, or rhonchi. Breathing is of normal effort and unlabored. Cardiovascular: Irregular rhythm. No murmur. . Distal pulses intact. No carotid or abdominal bruits. Breast: She defers breast exam.  Abdomen: Soft, non-tender, non-distended with normoactive bowel sounds.  No hepatosplenomegaly or masses. No rebound/guarding. No CVA tenderness. No hernias.  Genitourinary: Deferred. Age 51.  Musculoskeletal: Full range of motion and 5/5 strength throughout.  Skin: Warm and moist without erythema, ecchymosis, wounds, or rash. Neuro: A+Ox3. CN II-XII grossly intact. Moves all extremities spontaneously. Full sensation throughout. Normal gait.  Psych:  Responds to questions appropriately with a normal affect.   Assessment/Plan:  80 y.o. y/o female here for CPE  1. Visit for preventive health examination   A. Screening Labs: BMET---01/27/2015 FLP---12/24/2014 FLP/LFT --10/29/13 CBC -- 08/08/2014   B. Pap: Not indicated as she is now age 54  C. Screening Mammogram:  At visit 01/2015 she states that her last mammogram was 2 years ago. She is agreeable to call and schedule follow-up mammogram herself. Also wrote this down on her AVS to remind her to do this.  D. DEXA/BMD: She had this done 01/09/2007--Showed Osteoporosis. It was ordered by Dr. Sheela Stack by them. Pt says she was on Fosamax--no longer on this now.   E. Colorectal Cancer Screening:  At visit 01/29/15 she states that she just had a colonoscopy this month with Dr. Cristina Gong. States that she has follow-up visit with him tomorrow.  F. Immunizations:  Influenza:  N/A Tetanus:  ------------------------04/06/2010 Pneumococcal:      Pneumovax 23------------ 07/15/2005      Prevnar 13------------------ 03/14/2014       Zostavax:--Patient needs to contact her insurance company regarding cost of this. If she does want to proceed with this then she can call us and we will send prescription to her pharmacy and she will go there to actually receive the immunization.    2. Atrial fibrillation - PT with INR/Fingerstick INR therapeutic at 2.2. Continue current dose. Recheck 4 weeks.   1. Medicare annual wellness visit, subsequent   2. Long term current use of anticoagulant therapy - PT with  INR/Fingerstick  3. Essential hypertension  4. Osteopenia  5. Hyperlipidemia  6. Chronic atrial fibrillation  7. Chronic anticoagulation  8. Pulmonary emphysema, unspecified emphysema type  9. Pulmonary HTN  Subjective:   Patient presents for Medicare Annual/Subsequent preventive examination.   Review Past Medical/Family/Social: All of the sections are reviewed and updated and documented above and in epic.  Risk Factors  Current exercise habits:  She does no routine exercise. Dietary issues discussed: She has been educated on low sodium low cholesterol diet.  Cardiac risk factors: She sees cardiology every 6 months.  Depression Screen  (Note: if answer  to either of the following is "Yes", a more complete depression screening is indicated)  Over the past two weeks, have you felt down, depressed or hopeless? No Over the past two weeks, have you felt little interest or pleasure in doing things? No Have you lost interest or pleasure in daily life? No Do you often feel hopeless? No Do you cry easily over simple problems? No   Activities of Daily Living  In your present state of health, do you have any difficulty performing the following activities?:  Driving? No  Managing money? No  Feeding yourself? No  Getting from bed to chair? No  Climbing a flight of stairs? No  Preparing food and eating?: No  Bathing or showering? No  Getting dressed: No  Getting to the toilet? No  Using the toilet:No  Moving around from place to place: No  In the past year have you fallen or had a near fall?:No  Are you sexually active? No  Do you have more than one partner? No   Hearing Difficulties: No  Do you often ask people to speak up or repeat themselves? No  Do you experience ringing or noises in your ears? No Do you have difficulty understanding soft or whispered voices? No  Do you feel that you have a problem with memory? No Do you often misplace items? No  Do you feel safe at  home? Yes  Cognitive Testing  Alert? Yes Normal Appearance?Yes  Oriented to person? Yes Place? Yes  Time? Yes  Recall of three objects? Yes  Can perform simple calculations? Yes  Displays appropriate judgment?Yes  Can read the correct time from a watch face?Yes   List the Names of Other Physician/Practitioners you currently use:  See HPI   Indicate any recent Medical Services you [place] have received from other than [facility] providers in the past year (date [place] be approximate).  See HPI  Screening Tests / Date--------See above.A Colonoscopy                     Zostavax  Mammogram  Influenza Vaccine  Tetanus/tdap    Assessment:    Annual wellness medicare exam   Plan:    During the course of the visit the patient was educated and counseled about appropriate screening and preventive services including:  Screening mammography  Colorectal cancer screening  Shingles vaccine. Prescription given to that she can get the vaccine at the pharmacy or Medicare part D.  Screen + for depression. PHQ- 9 score of 12 (moderate depression). We discussed the options of counseling versus possibly a medication. I encouraged her strongly think about the counseling. She is going through some medical problems currently and her husband is as well Mrs. been very stressful for her. She says she will think about it. She does have Xanax to use as needed. Though she Waren benefit from an SSRI for her more depressive type symptoms but she wants to hold off at this time.  I aksed her to please have her cardioloist send records since we have none on file.  Diet review for nutrition referral? Yes ____ Not Indicated __x__  Patient Instructions (the written plan) was given to the patient.  Medicare Attestation  I have personally reviewed:  The patient's medical and social history  Their use of alcohol, tobacco or illicit drugs  Their current medications and supplements  The patient's functional ability including  ADLs,fall risks, home safety risks, cognitive, and hearing and visual impairment  Diet  and physical activities  Evidence for depression or mood disorders  The patient's weight, height, BMI, and visual acuity have been recorded in the chart. I have made referrals, counseling, and provided education to the patient based on review of the above and I have provided the patient with a written personalized care plan for preventive services.    AT OV 01/2015---I WROTE ON HER AVS AND TOLD HER: 1--- call to schedule mammogram 2----call insurance to find out how much they would pay for a Zostavax and what her cost would be and call us and tell us.   Signed, [address] [place], [place], [facility] 01/29/2015 2:15 PM

## 2015-02-04 ENCOUNTER — Encounter: Payer: Self-pay | Admitting: Family Medicine

## 2015-02-04 ENCOUNTER — Ambulatory Visit (INDEPENDENT_AMBULATORY_CARE_PROVIDER_SITE_OTHER): Payer: Medicare Other | Admitting: Family Medicine

## 2015-02-04 VITALS — BP 140/70 | HR 54 | Temp 98.1°F | Resp 18 | Ht 67.0 in | Wt 225.0 lb

## 2015-02-04 DIAGNOSIS — L03818 Cellulitis of other sites: Secondary | ICD-10-CM

## 2015-02-04 MED ORDER — CEPHALEXIN 500 MG PO CAPS: 500.0000 mg | ORAL_CAPSULE | Freq: Three times a day (TID) | ORAL | Status: DC

## 2015-02-04 NOTE — Progress Notes (Signed)
Subjective:    Patient ID: [name], female    DOB: [birth year], 80 y.o.   MRN: [id number]  HPI  Saturday, the patient suffered a skin tear on the dorsal surface of her left forearm. It is 1.5 cm x 2.5 cm it now the surrounding skin has become erythematous warm and painful. The area of erythema is approximately 5 cm in diameter. It appears to have become secondarily infected. Past Medical History  Diagnosis Date  . Cancer     right colon and left breast  . Breast cancer 01/06/2012  . Colon cancer 01/06/2012  . Hernia   . GERD (gastroesophageal reflux disease)   . Hypertension   . Allergy     rhinitis  . Osteopenia   . Diverticulosis   . Pneumonia     hx child  . Hiatal hernia     denies  . Arthritis   . Coronary artery disease 2006    nonobstructive with 20-30% ostial LAD and LM  . Atrial fibrillation     chronic atrial fibrillation  . Edema extremities   . Hyperlipidemia   . COPD (chronic obstructive pulmonary disease)   . Dilated aortic root   . Pulmonary HTN     moderate PASP 35mmHg   Past Surgical History  Procedure Laterality Date  . Colectomy      right side  . Mastectomy partial / lumpectomy  2008    left breast  . Cardiac catheterization    . Breast surgery      lumpectomy left  . Eye surgery Bilateral 12    cataracts  . Appendectomy    . Cholecystectomy    . Excision of accessory nipple Bilateral 05/30/2013    Procedure: BILATERAL NIPPLE BIOPSY;  Surgeon: Merrie Roof, MD;  Location: [facility];  Service: General;  Laterality: Bilateral;   Current Outpatient Prescriptions on File Prior to Visit  Medication Sig Dispense Refill  . acetaminophen (TYLENOL) 325 MG tablet Take 650 mg by mouth every 6 (six) hours as needed for mild pain.    . Cholecalciferol (VITAMIN D) 2000 UNITS CAPS Take 1 capsule by mouth daily.    . furosemide (LASIX) 20 MG tablet Take 1 tablet (20 mg total) by mouth daily. 90 tablet 1  . losartan (COZAAR) 100 MG tablet Take 1 tablet (100 mg  total) by mouth daily. 90 tablet 3  . metoprolol succinate (TOPROL-XL) 50 MG 24 hr tablet Take 1 tablet (50 mg total) by mouth daily. Take with or immediately following a meal. 90 tablet 3  . nitroGLYCERIN (NITROSTAT) 0.4 MG SL tablet Place 1 tablet (0.4 mg total) under the tongue every 5 (five) minutes as needed for chest pain. 25 tablet 1  . nystatin cream (MYCOSTATIN) Apply 1 application topically 2 (two) times daily. 30 g 0  . omeprazole (PRILOSEC) 20 MG capsule Take 20 mg by mouth daily as needed (heartburn, acid reflux).    Marland Kitchen OVER THE COUNTER MEDICATION Place 1 drop into both eyes daily as needed (dry eyes). Eye drops    . simvastatin (ZOCOR) 10 MG tablet Take 1 tablet (10 mg total) by mouth every other day. 90 tablet 3  . warfarin (COUMADIN) 1 MG tablet Take 1 mg by mouth See admin instructions. Take half a tablet (0.5 mg) with half a tablet of 5 mg (total 3 mg) on all other days (Not Tuesdays, Thursdays, and Sundays).    . warfarin (COUMADIN) 5 MG tablet Take 0.5 tablets (2.5  mg total) by mouth See admin instructions. Take half a tablet (2.5 mg) on Tuesdays, Thursdays, and Sundays. Take half a tablet with half a tablet of 1 mg (total 3 mg) on all other days. 90 tablet 3   No current facility-administered medications on file prior to visit.   Allergies  Allergen Reactions  . Contrast Media [Iodinated Diagnostic Agents] Other (See Comments)    Per pt strong burning sensation starting in chest radiating outward  . Statins Other (See Comments)    "bones hurt"  . Celebrex [Celecoxib] Rash  . Tape Itching and Rash    Red Where applied and will spread   History   Social History  . Marital Status: Married    Spouse Name: N/A  . Number of Children: N/A  . Years of Education: N/A   Occupational History  . Not on file.   Social History Main Topics  . Smoking status: Never Smoker   . Smokeless tobacco: Never Used  . Alcohol Use: No  . Drug Use: No  . Sexual Activity: Not Currently     Other Topics Concern  . Not on file   Social History Narrative     Review of Systems  All other systems reviewed and are negative.      Objective:   Physical Exam  Cardiovascular: Normal rate and normal heart sounds.   Pulmonary/Chest: Effort normal and breath sounds normal.  Skin: Skin is warm. There is erythema.  Vitals reviewed.         Assessment & Plan:  Cellulitis of other specified site - Plan: cephALEXin (KEFLEX) 500 MG capsule  Patient has suffered a skin tear which is become secondarily infected. I dressed the skin tear with a Tegaderm and recommended changing the dressing in 5 days. I will treat the secondary infection with Keflex 500 mg by mouth 3 times a day for 7 days. Recheck if not completely better in 5 days.

## 2015-02-05 ENCOUNTER — Telehealth: Payer: Self-pay | Admitting: Physician Assistant

## 2015-02-05 NOTE — Telephone Encounter (Signed)
Patient calling to let you know that the shingles shot will cost her $80, want to discuss with dr Doren Custard [phone]

## 2015-02-06 ENCOUNTER — Ambulatory Visit (INDEPENDENT_AMBULATORY_CARE_PROVIDER_SITE_OTHER): Payer: Medicare Other | Admitting: Family Medicine

## 2015-02-06 ENCOUNTER — Encounter: Payer: Self-pay | Admitting: Family Medicine

## 2015-02-06 VITALS — BP 120/74 | HR 58 | Temp 98.2°F | Resp 18 | Ht 67.0 in | Wt 225.0 lb

## 2015-02-06 DIAGNOSIS — L03818 Cellulitis of other sites: Secondary | ICD-10-CM

## 2015-02-06 NOTE — Progress Notes (Signed)
Subjective:    Patient ID: [name], female    DOB: [birth year], 80 y.o.   MRN: [id number]  HPI  02/04/15 Saturday, the patient suffered a skin tear on the dorsal surface of her left forearm. It is 1.5 cm x 2.5 cm it now the surrounding skin has become erythematous warm and painful. The area of erythema is approximately 5 cm in diameter. It appears to have become secondarily infected.  At that time, my plan was: Patient has suffered a skin tear which is become secondarily infected. I dressed the skin tear with a Tegaderm and recommended changing the dressing in 5 days. I will treat the secondary infection with Keflex 500 mg by mouth 3 times a day for 7 days. Recheck if not completely better in 5 days.  02/06/15 The erythema has faded substantially. The skin tear shrinking in size. Unfortunately there is a moderate collection of serous fluid being trapped underneath the Tegaderm. Past Medical History  Diagnosis Date  . Cancer     right colon and left breast  . Breast cancer 01/06/2012  . Colon cancer 01/06/2012  . Hernia   . GERD (gastroesophageal reflux disease)   . Hypertension   . Allergy     rhinitis  . Osteopenia   . Diverticulosis   . Pneumonia     hx child  . Hiatal hernia     denies  . Arthritis   . Coronary artery disease 2006    nonobstructive with 20-30% ostial LAD and LM  . Atrial fibrillation     chronic atrial fibrillation  . Edema extremities   . Hyperlipidemia   . COPD (chronic obstructive pulmonary disease)   . Dilated aortic root   . Pulmonary HTN     moderate PASP 38mmHg   Past Surgical History  Procedure Laterality Date  . Colectomy      right side  . Mastectomy partial / lumpectomy  2008    left breast  . Cardiac catheterization    . Breast surgery      lumpectomy left  . Eye surgery Bilateral 12    cataracts  . Appendectomy    . Cholecystectomy    . Excision of accessory nipple Bilateral 05/30/2013    Procedure: BILATERAL NIPPLE BIOPSY;  Surgeon:  Merrie Roof, MD;  Location: [facility];  Service: General;  Laterality: Bilateral;   Current Outpatient Prescriptions on File Prior to Visit  Medication Sig Dispense Refill  . acetaminophen (TYLENOL) 325 MG tablet Take 650 mg by mouth every 6 (six) hours as needed for mild pain.    . cephALEXin (KEFLEX) 500 MG capsule Take 1 capsule (500 mg total) by mouth 3 (three) times daily. 21 capsule 0  . Cholecalciferol (VITAMIN D) 2000 UNITS CAPS Take 1 capsule by mouth daily.    . furosemide (LASIX) 20 MG tablet Take 1 tablet (20 mg total) by mouth daily. 90 tablet 1  . losartan (COZAAR) 100 MG tablet Take 1 tablet (100 mg total) by mouth daily. 90 tablet 3  . metoprolol succinate (TOPROL-XL) 50 MG 24 hr tablet Take 1 tablet (50 mg total) by mouth daily. Take with or immediately following a meal. 90 tablet 3  . nitroGLYCERIN (NITROSTAT) 0.4 MG SL tablet Place 1 tablet (0.4 mg total) under the tongue every 5 (five) minutes as needed for chest pain. 25 tablet 1  . nystatin cream (MYCOSTATIN) Apply 1 application topically 2 (two) times daily. 30 g 0  . omeprazole (PRILOSEC)  20 MG capsule Take 20 mg by mouth daily as needed (heartburn, acid reflux).    Marland Kitchen OVER THE COUNTER MEDICATION Place 1 drop into both eyes daily as needed (dry eyes). Eye drops    . simvastatin (ZOCOR) 10 MG tablet Take 1 tablet (10 mg total) by mouth every other day. 90 tablet 3  . warfarin (COUMADIN) 1 MG tablet Take 1 mg by mouth See admin instructions. Take half a tablet (0.5 mg) with half a tablet of 5 mg (total 3 mg) on all other days (Not Tuesdays, Thursdays, and Sundays).    . warfarin (COUMADIN) 5 MG tablet Take 0.5 tablets (2.5 mg total) by mouth See admin instructions. Take half a tablet (2.5 mg) on Tuesdays, Thursdays, and Sundays. Take half a tablet with half a tablet of 1 mg (total 3 mg) on all other days. 90 tablet 3   No current facility-administered medications on file prior to visit.   Allergies  Allergen Reactions  .  Contrast Media [Iodinated Diagnostic Agents] Other (See Comments)    Per pt strong burning sensation starting in chest radiating outward  . Statins Other (See Comments)    "bones hurt"  . Celebrex [Celecoxib] Rash  . Tape Itching and Rash    Red Where applied and will spread   History   Social History  . Marital Status: Married    Spouse Name: N/A  . Number of Children: N/A  . Years of Education: N/A   Occupational History  . Not on file.   Social History Main Topics  . Smoking status: Never Smoker   . Smokeless tobacco: Never Used  . Alcohol Use: No  . Drug Use: No  . Sexual Activity: Not Currently   Other Topics Concern  . Not on file   Social History Narrative     Review of Systems  All other systems reviewed and are negative.      Objective:   Physical Exam  Cardiovascular: Normal rate and normal heart sounds.   Pulmonary/Chest: Effort normal and breath sounds normal. .diag Skin: Skin is warm. No erythema.  Vitals reviewed.         Assessment & Plan:  Cellulitis of other specified site  Cellulitis appears to have resolved. Complete keflex. Skin tear is shrinking drastically in size.  I replaced the bandage with a nonadherent gauze, and then applied gauze with Coban as a pressure dressing.  Change this dressing every 48 hours. I anticipate complete and total healing within 4-5 days

## 2015-02-06 NOTE — Telephone Encounter (Signed)
Spoke to pt about this in ov today and she just wanted to let you know that it was [place] cost too much for her to get.

## 2015-02-07 NOTE — Telephone Encounter (Signed)
Please document in Immunization section so we will not keep asking pt about this and so it will show up for Quality Metrics.  ALSO PUT THIS IN HER HUSBANDS CHART, for same purpose as above. --- Braulio Conte [place]

## 2015-02-10 ENCOUNTER — Encounter: Payer: Self-pay | Admitting: Physician Assistant

## 2015-02-10 NOTE — Telephone Encounter (Signed)
Noted and completed on both

## 2015-02-11 ENCOUNTER — Ambulatory Visit: Payer: Medicare Other | Admitting: Cardiology

## 2015-02-14 ENCOUNTER — Encounter: Payer: Self-pay | Admitting: Cardiology

## 2015-02-26 ENCOUNTER — Ambulatory Visit: Payer: Medicare Other | Admitting: Physician Assistant

## 2015-02-27 ENCOUNTER — Encounter: Payer: Self-pay | Admitting: Physician Assistant

## 2015-02-27 ENCOUNTER — Ambulatory Visit (INDEPENDENT_AMBULATORY_CARE_PROVIDER_SITE_OTHER): Payer: Medicare Other | Admitting: Physician Assistant

## 2015-02-27 VITALS — BP 130/80 | HR 62 | Temp 98.1°F | Resp 16 | Wt 228.0 lb

## 2015-02-27 DIAGNOSIS — I482 Chronic atrial fibrillation, unspecified: Secondary | ICD-10-CM

## 2015-02-27 DIAGNOSIS — S41112D Laceration without foreign body of left upper arm, subsequent encounter: Secondary | ICD-10-CM

## 2015-02-27 DIAGNOSIS — Z7901 Long term (current) use of anticoagulants: Secondary | ICD-10-CM | POA: Diagnosis not present

## 2015-02-27 LAB — PT WITH INR/FINGERSTICK
INR FINGERSTICK: 2.1 — AB (ref 0.80–1.20)
PT FINGERSTICK: 25.5 s — AB (ref 10.4–12.5)

## 2015-02-27 MED ORDER — SULFAMETHOXAZOLE-TRIMETHOPRIM 800-160 MG PO TABS: 1.0000 | ORAL_TABLET | Freq: Two times a day (BID) | ORAL | Status: DC

## 2015-02-27 NOTE — Progress Notes (Addendum)
Patient ID: [name] MRN: [id number], DOB: [birth year], 80 y.o. Date of Encounter: @DATE @  Chief Complaint:  Chief Complaint  Patient presents with  . OTHER    arm not better was here 3 weeks ago saw pickard, would like to have to antibiotic and c/o of swelling at night     HPI: 80 y.o. year old female  presents with above. Also wants to check INR all here. She had an appointment scheduled for this this upcoming week.   THE FOLLOWING IS COPIED FROM DR. PICKARDS OV NOTES HPI ON 02/04/15 AND 02/06/2015:  02/04/15 Saturday, the patient suffered a skin tear on the dorsal surface of her left forearm. It is 1.5 cm x 2.5 cm it now the surrounding skin has become erythematous warm and painful. The area of erythema is approximately 5 cm in diameter. It appears to have become secondarily infected. At that time, my plan was: Patient has suffered a skin tear which is become secondarily infected. I dressed the skin tear with a Tegaderm and recommended changing the dressing in 5 days. I will treat the secondary infection with Keflex 500 mg by mouth 3 times a day for 7 days. Recheck if not completely better in 5 days.  02/06/15 The erythema has faded substantially. The skin tear shrinking in size. Unfortunately there is a moderate collection of serous fluid being trapped underneath the Tegaderm. At that visit 02/06/15 Dr. Samella Parr assessment and plan was: Cellulitis appears to have resolved. Complete Keflex. Skin tear is shrinking drastically in size. I replaced the bandage with a nonadherent gauze and then applied gauze with Coban as a pressure dressing. Change the dressing every 48 hours. Anticipate complete healing within 4-5 days.  TODAY--02/27/2015: Patient states that on Sunday 02/09/15 she had some bleeding from the site. Went to an urgent care. That they gave her more of the same antibiotic--Keflex. Says they told her to put "salve and pad " on it. Asked her how the appearance of it today compares to  at other times over the past few weeks she says she can't really remember what it had looked at it like it other times. Voiding to her it looks similar now as it had looked at other visits. However I think it's probably worse than what Dr. Dennard Schaumann had documented. Nonetheless she comes in today because of continued area of redness, drainage. Also reviewed the fact that she has an allergy to tape and was wondering if any of what is going on now is secondary to an allergic reaction type dermatitis. However she says that she has put nothing on the site for the past 3 days except that last night she did apply some Neosporin. Site is no worse today compared to prior to the application of Neosporin. Says that she has had no bandages no ointments nothing on it for the past 3 days.  Says says that she had an appointment for next week to check her INR and wants to check that while she is here today. She is taking her Coumadin as directed. No skips doses. No medicine changes other than being on the Keflex.  Past Medical History  Diagnosis Date  . Cancer     right colon and left breast  . Breast cancer 01/06/2012  . Colon cancer 01/06/2012  . Hernia   . GERD (gastroesophageal reflux disease)   . Hypertension   . Allergy     rhinitis  . Osteopenia   . Diverticulosis   .  Pneumonia     hx child  . Hiatal hernia     denies  . Arthritis   . Coronary artery disease 2006    nonobstructive with 20-30% ostial LAD and LM  . Atrial fibrillation     chronic atrial fibrillation  . Edema extremities   . Hyperlipidemia   . COPD (chronic obstructive pulmonary disease)   . Dilated aortic root   . Pulmonary HTN     moderate PASP 41mmHg     Home Meds: Outpatient Prescriptions Prior to Visit  Medication Sig Dispense Refill  . acetaminophen (TYLENOL) 325 MG tablet Take 650 mg by mouth every 6 (six) hours as needed for mild pain.    . Cholecalciferol (VITAMIN D) 2000 UNITS CAPS Take 1 capsule by mouth daily.      . furosemide (LASIX) 20 MG tablet Take 1 tablet (20 mg total) by mouth daily. 90 tablet 1  . losartan (COZAAR) 100 MG tablet Take 1 tablet (100 mg total) by mouth daily. 90 tablet 3  . metoprolol succinate (TOPROL-XL) 50 MG 24 hr tablet Take 1 tablet (50 mg total) by mouth daily. Take with or immediately following a meal. 90 tablet 3  . nitroGLYCERIN (NITROSTAT) 0.4 MG SL tablet Place 1 tablet (0.4 mg total) under the tongue every 5 (five) minutes as needed for chest pain. 25 tablet 1  . omeprazole (PRILOSEC) 20 MG capsule Take 20 mg by mouth daily as needed (heartburn, acid reflux).    Marland Kitchen OVER THE COUNTER MEDICATION Place 1 drop into both eyes daily as needed (dry eyes). Eye drops    . simvastatin (ZOCOR) 10 MG tablet Take 1 tablet (10 mg total) by mouth every other day. 90 tablet 3  . warfarin (COUMADIN) 1 MG tablet Take 1 mg by mouth See admin instructions. Take half a tablet (0.5 mg) with half a tablet of 5 mg (total 3 mg) on all other days (Not Tuesdays, Thursdays, and Sundays).    . warfarin (COUMADIN) 5 MG tablet Take 0.5 tablets (2.5 mg total) by mouth See admin instructions. Take half a tablet (2.5 mg) on Tuesdays, Thursdays, and Sundays. Take half a tablet with half a tablet of 1 mg (total 3 mg) on all other days. 90 tablet 3  . cephALEXin (KEFLEX) 500 MG capsule Take 1 capsule (500 mg total) by mouth 3 (three) times daily. (Patient not taking: Reported on 02/27/2015) 21 capsule 0  . nystatin cream (MYCOSTATIN) Apply 1 application topically 2 (two) times daily. (Patient not taking: Reported on 02/27/2015) 30 g 0   No facility-administered medications prior to visit.    Allergies:  Allergies  Allergen Reactions  . Contrast Media [Iodinated Diagnostic Agents] Other (See Comments)    Per pt strong burning sensation starting in chest radiating outward  . Statins Other (See Comments)    "bones hurt"  . Celebrex [Celecoxib] Rash  . Tape Itching and Rash    Red Where applied and will  spread    History   Social History  . Marital Status: Married    Spouse Name: N/A  . Number of Children: N/A  . Years of Education: N/A   Occupational History  . Not on file.   Social History Main Topics  . Smoking status: Never Smoker   . Smokeless tobacco: Never Used  . Alcohol Use: No  . Drug Use: No  . Sexual Activity: Not Currently   Other Topics Concern  . Not on file   Social History Narrative  Family History  Problem Relation Age of Onset  . Heart disease Mother   . Cancer Sister     stomach and colon  . Heart disease Brother 74  . Cancer Sister      Review of Systems:  See HPI for pertinent ROS. All other ROS negative.    Physical Exam: Blood pressure 130/80, pulse 62, temperature 98.1 F (36.7 C), temperature source Oral, resp. rate 16, weight 228 lb (103.42 kg)., Body mass index is 35.7 kg/(m^2). General: Obese white female . Appears in no acute distress. Lungs: Clear bilaterally to auscultation without wheezes, rales, or rhonchi. Breathing is unlabored. Heart: Irregular. Musculoskeletal:  Strength and tone normal for age. Extremities/Skin: Left Forearm: Scab in center of site measures 2cm x 0.5cm. Surrounding this is erythema which measures 7 cm x 6.5 cm. Within this area, there are small openings with serous drainage oozing from them.  Neuro: Alert and oriented X 3. Moves all extremities spontaneously. Gait is normal. CNII-XII grossly in tact. Psych:  Responds to questions appropriately with a normal affect.     ASSESSMENT AND PLAN:  80 y.o. year old female with  1. Laceration of arm, left, subsequent encounter Will change antibiotic to Bactrim. Schedule her an appointment at the wound center. If site worsens in the interim then call us immediately. - sulfamethoxazole-trimethoprim (BACTRIM DS,SEPTRA DS) 800-160 MG per tablet; Take 1 tablet by mouth 2 (two) times daily.  Dispense: 14 tablet; Refill: 0 - AMB referral to wound care center  I  received note from the [facility] center dated 03/07/15. Stated that she had started treatment there 03/06/15. At that visit they recommended applying moisturizer around the area and a piece of Xeroform gauze and a light bandage not to be disturbed for the next week. To follow-up with them 1 week.  2. Chronic atrial fibrillation - PT with INR/Fingerstick  3. Chronic anticoagulation - PT with INR/Fingerstick  INR therapeutic at 2.1. Continue current dose of Coumadin. Recheck PT/INR 4 weeks.  Marin Olp [place], [place], [facility] 02/27/2015 10:22 AM

## 2015-03-03 ENCOUNTER — Ambulatory Visit: Payer: Medicare Other | Admitting: Physician Assistant

## 2015-03-06 ENCOUNTER — Encounter: Payer: Medicare Other | Attending: Surgery | Admitting: Surgery

## 2015-03-06 DIAGNOSIS — X58XXXA Exposure to other specified factors, initial encounter: Secondary | ICD-10-CM | POA: Diagnosis not present

## 2015-03-06 DIAGNOSIS — S51812A Laceration without foreign body of left forearm, initial encounter: Secondary | ICD-10-CM | POA: Insufficient documentation

## 2015-03-07 NOTE — Progress Notes (Signed)
Susan, [name] ([id number]) Visit Report for 03/06/2015 Allergy List Details Patient Name: [name], [name]. Date of Service: 03/06/2015 2:30 PM Medical Record Number: [id number] Patient Account Number: [id number] Date of Birth/Sex: 1935/09/01 (80 y.o. Female) Treating RN: Montey Hora Primary Care Physician: Dena Billet Other Clinician: Referring Physician: Dena Billet Treating Physician/Extender: Frann Rider in Treatment: 0 Allergies Active Allergies Iodinated Contrast Media - IV Dye Statins-Hmg-Coa Reductase Inhibitors Celebrex adhesive tape Allergy Notes Electronic Signature(s) Signed: 03/06/2015 4:45:24 PM By: Montey Hora Entered By: Montey Hora on 03/06/2015 14:50:19 Dimascio, Lindalee M. ([id number]) -------------------------------------------------------------------------------- Arrival Information Details Patient Name: Susan, [name] Date of Service: 03/06/2015 2:30 PM Medical Record Number: [id number] Patient Account Number: [id number] Date of Birth/Sex: 1935-03-16 (80 y.o. Female) Treating RN: Montey Hora Primary Care Physician: Dena Billet Other Clinician: Referring Physician: Dena Billet Treating Physician/Extender: Frann Rider in Treatment: 0 Visit Information Patient Arrived: Ambulatory Arrival Time: 14:46 Accompanied By: self Transfer Assistance: None Patient Identification Verified: Yes Secondary Verification Process Yes Completed: Patient Has Alerts: Yes Patient Alerts: Patient on Blood Thinner warfarin NO BP LEFT ARM Electronic Signature(s) Signed: 03/06/2015 4:45:24 PM By: Montey Hora Entered By: Montey Hora on 03/06/2015 14:47:17 Steenson, Annetta M. ([id number]) -------------------------------------------------------------------------------- Clinic Level of Care Assessment Details Patient Name: [name], [name] Date of Service: 03/06/2015 2:30 PM Medical Record Number: [id number] Patient Account Number: [id number] Date of Birth/Sex: [birth year] (80 y.o.  Female) Treating RN: Montey Hora Primary Care Physician: Dena Billet Other Clinician: Referring Physician: Dena Billet Treating Physician/Extender: Frann Rider in Treatment: 0 Clinic Level of Care Assessment Items TOOL 2 Quantity Score []  - Use when only an EandM is performed on the INITIAL visit 0 ASSESSMENTS - Nursing Assessment / Reassessment X - General Physical Exam (combine w/ comprehensive assessment (listed just 1 20 below) when performed on new pt. evals) X - Comprehensive Assessment (HX, ROS, Risk Assessments, Wounds Hx, etc.) 1 25 ASSESSMENTS - Wound and Skin Assessment / Reassessment X - Simple Wound Assessment / Reassessment - one wound 1 5 []  - Complex Wound Assessment / Reassessment - multiple wounds 0 []  - Dermatologic / Skin Assessment (not related to wound area) 0 ASSESSMENTS - Ostomy and/or Continence Assessment and Care []  - Incontinence Assessment and Management 0 []  - Ostomy Care Assessment and Management (repouching, etc.) 0 PROCESS - Coordination of Care X - Simple Patient / Family Education for ongoing care 1 15 []  - Complex (extensive) Patient / Family Education for ongoing care 0 X - Staff obtains Programmer, systems, Records, Test Results / Process Orders 1 10 []  - Staff telephones HHA, Nursing Homes / Clarify orders / etc 0 []  - Routine Transfer to another Facility (non-emergent condition) 0 []  - Routine Hospital Admission (non-emergent condition) 0 X - New Admissions / Biomedical engineer / Ordering NPWT, Apligraf, etc. 1 15 []  - Emergency Hospital Admission (emergent condition) 0 X - Simple Discharge Coordination 1 10 Fedorko, Vaunda M. ([id number]) []  - Complex (extensive) Discharge Coordination 0 PROCESS - Special Needs []  - Pediatric / Minor Patient Management 0 []  - Isolation Patient Management 0 []  - Hearing / Language / Visual special needs 0 []  - Assessment of Community assistance (transportation, D/C planning, etc.) 0 []  - Additional  assistance / Altered mentation 0 []  - Support Surface(s) Assessment (bed, cushion, seat, etc.) 0 INTERVENTIONS - Wound Cleansing / Measurement X - Wound Imaging (photographs - any number of wounds) 1 5 []  - Wound Tracing (instead of photographs) 0 X - Simple Wound Measurement - one  wound 1 5 []  - Complex Wound Measurement - multiple wounds 0 X - Simple Wound Cleansing - one wound 1 5 []  - Complex Wound Cleansing - multiple wounds 0 INTERVENTIONS - Wound Dressings X - Small Wound Dressing one or multiple wounds 1 10 []  - Medium Wound Dressing one or multiple wounds 0 []  - Large Wound Dressing one or multiple wounds 0 []  - Application of Medications - injection 0 INTERVENTIONS - Miscellaneous []  - External ear exam 0 []  - Specimen Collection (cultures, biopsies, blood, body fluids, etc.) 0 []  - Specimen(s) / Culture(s) sent or taken to Lab for analysis 0 []  - Patient Transfer (multiple staff / Civil Service fast streamer / Similar devices) 0 []  - Simple Staple / Suture removal (25 or less) 0 []  - Complex Staple / Suture removal (26 or more) 0 Budge, Saul M. ([id number]) []  - Hypo / Hyperglycemic Management (close monitor of Blood Glucose) 0 []  - Ankle / Brachial Index (ABI) - do not check if billed separately 0 Has the patient been seen at the hospital within the last three years: Yes Total Score: 125 Level Of Care: New/Established - Level 4 Electronic Signature(s) Signed: 03/06/2015 4:45:24 PM By: Montey Hora Entered By: Montey Hora on 03/06/2015 15:13:15 Trunnell, Alantra M. ([id number]) -------------------------------------------------------------------------------- Encounter Discharge Information Details Patient Name: [name], [name] Date of Service: 03/06/2015 2:30 PM Medical Record Number: [id number] Patient Account Number: [id number] Date of Birth/Sex: 09-02-1935 (80 y.o. Female) Treating RN: Montey Hora Primary Care Physician: Dena Billet Other Clinician: Referring Physician: Dena Billet Treating  Physician/Extender: Frann Rider in Treatment: 0 Encounter Discharge Information Items Discharge Pain Level: 0 Discharge Condition: Stable Ambulatory Status: Ambulatory Discharge Destination: Home Private Transportation: Auto Accompanied By: self Schedule Follow-up Appointment: Yes Medication Reconciliation completed and No provided to Patient/Care Maziah Keeling: Clinical Summary of Care: Electronic Signature(s) Signed: 03/06/2015 4:45:24 PM By: Montey Hora Entered By: Montey Hora on 03/06/2015 15:14:08 Chalmers, Kadince M. ([id number]) -------------------------------------------------------------------------------- Multi Wound Chart Details Patient Name: Payment, Keslee M. Date of Service: 03/06/2015 2:30 PM Medical Record Number: [id number] Patient Account Number: [id number] Date of Birth/Sex: [birth year] (80 y.o. Female) Treating RN: Montey Hora Primary Care Physician: Dena Billet Other Clinician: Referring Physician: Dena Billet Treating Physician/Extender: Frann Rider in Treatment: 0 Vital Signs Height(in): 66 Pulse(bpm): 49 Weight(lbs): 226 Blood Pressure 187/76 (mmHg): Body Mass Index(BMI): 36 Temperature(F): 98.1 Respiratory Rate 18 (breaths/min): Photos: [1:No Photos] [N/A:N/A] Wound Location: [1:Left Forearm] [N/A:N/A] Wounding Event: [1:Trauma] [N/A:N/A] Primary Etiology: [1:Trauma, Other] [N/A:N/A] Comorbid History: [1:Chronic Obstructive Pulmonary Disease (COPD), Coronary Artery Disease, Hypertension, Received Chemotherapy] [N/A:N/A] Date Acquired: [1:02/02/2015] [N/A:N/A] Weeks of Treatment: [1:0] [N/A:N/A] Wound Status: [1:Open] [N/A:N/A] Measurements L x W x D 0.3x1.1x0.1 [N/A:N/A] (cm) Area (cm) : [1:0.259] [N/A:N/A] Volume (cm) : [1:0.026] [N/A:N/A] % Reduction in Area: [1:0.00%] [N/A:N/A] % Reduction in Volume: 0.00% [N/A:N/A] Classification: [1:Partial Thickness] [N/A:N/A] Exudate Amount: [1:Small] [N/A:N/A] Exudate Type: [1:Serous]  [N/A:N/A] Exudate Color: [1:amber] [N/A:N/A] Wound Margin: [1:Flat and Intact] [N/A:N/A] Granulation Amount: [1:Large (67-100%)] [N/A:N/A] Granulation Quality: [1:Pink] [N/A:N/A] Necrotic Amount: [1:None Present (0%)] [N/A:N/A] Exposed Structures: [1:Fascia: No Fat: No Tendon: No Muscle: No] [N/A:N/A] Joint: No Bone: No Limited to Skin Breakdown Epithelialization: Small (1-33%) N/A N/A Periwound Skin Texture: Edema: No N/A N/A Excoriation: No Induration: No Callus: No Crepitus: No Fluctuance: No Friable: No Rash: No Scarring: No Periwound Skin Maceration: No N/A N/A Moisture: Moist: No Dry/Scaly: No Periwound Skin Color: Atrophie Blanche: No N/A N/A Cyanosis: No Ecchymosis: No Erythema: No Hemosiderin Staining: No Mottled: No Pallor:  No Rubor: No Temperature: No Abnormality N/A N/A Tenderness on No N/A N/A Palpation: Wound Preparation: Ulcer Cleansing: N/A N/A Rinsed/Irrigated with Saline Topical Anesthetic Applied: Other: lidocaine 4% Treatment Notes Electronic Signature(s) Signed: 03/06/2015 4:45:24 PM By: Montey Hora Entered By: Montey Hora on 03/06/2015 15:11:20 Whetstine, [name] ([id number]) -------------------------------------------------------------------------------- Multi-Disciplinary Care Plan Details Patient Name: Arocho, Skyrah M. Date of Service: 03/06/2015 2:30 PM Medical Record Number: [id number] Patient Account Number: [id number] Date of Birth/Sex: Aug 20, 1935 (80 y.o. Female) Treating RN: Montey Hora Primary Care Physician: Dena Billet Other Clinician: Referring Physician: Dena Billet Treating Physician/Extender: Frann Rider in Treatment: 0 Active Inactive Orientation to the Wound Care Program Nursing Diagnoses: Knowledge deficit related to the wound healing center program Goals: Patient/caregiver will verbalize understanding of the [facility] Program Date Initiated: 03/06/2015 Goal Status: Active Interventions: Provide  education on orientation to the wound center Notes: Wound/Skin Impairment Nursing Diagnoses: Impaired tissue integrity Goals: Ulcer/skin breakdown will have a volume reduction of 30% by week 4 Date Initiated: 03/06/2015 Goal Status: Active Interventions: Assess ulceration(s) every visit Notes: Electronic Signature(s) Signed: 03/06/2015 4:45:24 PM By: Montey Hora Entered By: Montey Hora on 03/06/2015 15:10:46 Dethlefs, Gwen Jerilynn Mages ([id number]) -------------------------------------------------------------------------------- Patient/Caregiver Education Details Patient Name: Driscoll, Haydn M. Date of Service: 03/06/2015 2:30 PM Medical Record Number: [id number] Patient Account Number: [id number] Date of Birth/Gender: [birth year] (80 y.o. Female) Treating RN: Montey Hora Primary Care Physician: Dena Billet Other Clinician: Referring Physician: Dena Billet Treating Physician/Extender: Frann Rider in Treatment: 0 Education Assessment Education Provided To: Patient Education Topics Provided Wound/Skin Impairment: Handouts: Other: wound care as ordered Methods: Demonstration, Explain/Verbal Responses: State content correctly Electronic Signature(s) Signed: 03/06/2015 4:45:24 PM By: Montey Hora Entered By: Montey Hora on 03/06/2015 15:14:26 Donica, Hodan M. ([id number]) -------------------------------------------------------------------------------- Wound Assessment Details Patient Name: Rathke, Priyanka M. Date of Service: 03/06/2015 2:30 PM Medical Record Number: [id number] Patient Account Number: [id number] Date of Birth/Sex: 03-19-1935 (80 y.o. Female) Treating RN: Montey Hora Primary Care Physician: Dena Billet Other Clinician: Referring Physician: Dena Billet Treating Physician/Extender: Frann Rider in Treatment: 0 Wound Status Wound Number: 1 Primary Trauma, Other Etiology: Wound Location: Left Forearm Wound Open Wounding Event: Trauma Status: Date Acquired:  02/02/2015 Comorbid Chronic Obstructive Pulmonary Disease Weeks Of Treatment: 0 History: (COPD), Coronary Artery Disease, Clustered Wound: No Hypertension, Received Chemotherapy Photos Photo Uploaded By: Montey Hora on 03/06/2015 15:35:01 Wound Measurements Length: (cm) 0.3 Width: (cm) 1.1 Depth: (cm) 0.1 Area: (cm) 0.259 Volume: (cm) 0.026 % Reduction in Area: 0% % Reduction in Volume: 0% Epithelialization: Small (1-33%) Tunneling: No Undermining: No Wound Description Classification: Partial Thickness Wound Margin: Flat and Intact Exudate Amount: Small Exudate Type: Serous Exudate Color: amber Foul Odor After Cleansing: No Wound Bed Granulation Amount: Large (67-100%) Exposed Structure Granulation Quality: Pink Fascia Exposed: No Necrotic Amount: None Present (0%) Fat Layer Exposed: No Tendon Exposed: No Caffey, Eltha M. ([id number]) Muscle Exposed: No Joint Exposed: No Bone Exposed: No Limited to Skin Breakdown Periwound Skin Texture Texture Color No Abnormalities Noted: No No Abnormalities Noted: No Callus: No Atrophie Blanche: No Crepitus: No Cyanosis: No Excoriation: No Ecchymosis: No Fluctuance: No Erythema: No Friable: No Hemosiderin Staining: No Induration: No Mottled: No Localized Edema: No Pallor: No Rash: No Rubor: No Scarring: No Temperature / Pain Moisture Temperature: No Abnormality No Abnormalities Noted: No Dry / Scaly: No Maceration: No Moist: No Wound Preparation Ulcer Cleansing: Rinsed/Irrigated with Saline Topical Anesthetic Applied: Other: lidocaine 4%, Treatment Notes Wound #1 (Left Forearm) 1. Cleansed with: Clean wound  with Normal Saline 2. Anesthetic Topical Lidocaine 4% cream to wound bed prior to debridement 4. Dressing Applied: Petroleum gauze 5. Secondary Dressing Applied Kerlix/Conform Non-Adherent pad 7. Secured with Recruitment consultant) Signed: 03/06/2015 4:45:24 PM By: Montey Hora Entered  By: Montey Hora on 03/06/2015 15:01:58 Rosetti, Rayla M. ([id number]) -------------------------------------------------------------------------------- Vitals Details Patient Name: Aispuro, Jenniah M. Date of Service: 03/06/2015 2:30 PM Medical Record Number: [id number] Patient Account Number: [id number] Date of Birth/Sex: 07/15/1935 (80 y.o. Female) Treating RN: Montey Hora Primary Care Physician: Dena Billet Other Clinician: Referring Physician: Dena Billet Treating Physician/Extender: Frann Rider in Treatment: 0 Vital Signs Time Taken: 14:27 Temperature (F): 98.1 Height (in): 66 Pulse (bpm): 49 Source: Stated Respiratory Rate (breaths/min): 18 Weight (lbs): 226 Blood Pressure (mmHg): 187/76 Source: Stated Reference Range: 80 - 120 mg / dl Body Mass Index (BMI): 36.5 Electronic Signature(s) Signed: 03/06/2015 4:45:24 PM By: Montey Hora Entered By: Montey Hora on 03/06/2015 14:48:41

## 2015-03-07 NOTE — Progress Notes (Signed)
[name] ([id number]) Visit Report for 03/06/2015 Chief Complaint Document Details Patient Name: [name], [name]. Date of Service: 03/06/2015 2:30 PM Medical Record Number: [id number] Patient Account Number: [id number] Date of Birth/Sex: March 14, 1935 (80 y.o. Female) Treating RN: Primary Care Physician: Dena Billet Other Clinician: Referring Physician: Dena Billet Treating Physician/Extender: Frann Rider in Treatment: 0 Information Obtained from: Patient Chief Complaint Patient presents to the wound care center for a consult due non healing wound. Very pleasant 80 year old with a injury to her left forearm about a month ago. Electronic Signature(s) Signed: 03/06/2015 4:41:39 PM By: Christin Fudge MD, FACS Entered By: Christin Fudge on 03/06/2015 15:37:21 Sievers, Halena Jerilynn Mages ([id number]) -------------------------------------------------------------------------------- HPI Details Patient Name: [name]. Date of Service: 03/06/2015 2:30 PM Medical Record Number: [id number] Patient Account Number: [id number] Date of Birth/Sex: 05/28/1935 (80 y.o. Female) Treating RN: Primary Care Physician: Dena Billet Other Clinician: Referring Physician: Dena Billet Treating Physician/Extender: Frann Rider in Treatment: 0 History of Present Illness Location: left forearm Quality: Patient reports No Pain. Severity: Patient states wound (s) are getting better. Duration: Patient has had the wound for < 5 weeks prior to presenting for treatment Context: The wound occurred when the patient injured her left forearm on a doorknob Modifying Factors: Other treatment(s) tried include:location care and 2 separate courses of anti-biotics Associated Signs and Symptoms: Patient reports presence of swelling HPI Description: 80 year old patient who comes in from her PCP with a history of having a injury to her left forearm about a month ago. She has been seen several times with PCPs please office and has been  given symptomatic treatment and has also been treated with local dressings and antibiotics. she has received Keflex for 7 days and now most recently she is on Bactrim. She stopped the Bactrim after 2 days because she developed an allergy The patient has a history of having atrial fibrillation as and is on Coumadin. her past medical history is also significant for cancer of the colon and left breast, coronary artery disease, atrial fibrillation, edema of extremities and pulmonary hypertension. Electronic Signature(s) Signed: 03/06/2015 4:41:39 PM By: Christin Fudge MD, FACS Entered By: Christin Fudge on 03/06/2015 15:38:45 [place], Shakenya Jerilynn Mages ([id number]) -------------------------------------------------------------------------------- Physical Exam Details Patient Name: [name], [name] Date of Service: 03/06/2015 2:30 PM Medical Record Number: [id number] Patient Account Number: [id number] Date of Birth/Sex: [birth year] (80 y.o. Female) Treating RN: Primary Care Physician: Dena Billet Other Clinician: Referring Physician: Dena Billet Treating Physician/Extender: Frann Rider in Treatment: 0 Constitutional . Pulse regular. Respirations normal and unlabored. Afebrile. . Eyes Nonicteric. Reactive to light. Ears, Nose, Mouth, and Throat Lips, teeth, and gums WNL.Marland Kitchen Moist mucosa without lesions . Neck supple and nontender. No palpable supraclavicular or cervical adenopathy. Normal sized without goiter. Respiratory WNL. No retractions.. Cardiovascular Pedal Pulses WNL. No clubbing, cyanosis or edema. Gastrointestinal (GI) Abdomen without masses or tenderness.. No liver or spleen enlargement or tenderness.. Musculoskeletal Adexa without tenderness or enlargement.. Digits and nails w/o clubbing, cyanosis, infection, petechiae, ischemia, or inflammatory conditions.. Integumentary (Hair, Skin) No suspicious lesions. No crepitus or fluctuance. No peri-wound warmth or erythema. No  masses.Marland Kitchen Psychiatric Judgement and insight Intact.. No evidence of depression, anxiety, or agitation.. Notes on the left forearm she has an apical eyes wound but surrounding it there is a lot of tape burn and some evidence of reaction to the adhesive. Electronic Signature(s) Signed: 03/06/2015 4:41:39 PM By: Christin Fudge MD, FACS Entered By: Christin Fudge on 03/06/2015 15:39:49 Sulton, Karyna Jerilynn Mages ([id number]) -------------------------------------------------------------------------------- Physician  Orders Details Patient Name: [name]. Date of Service: 03/06/2015 2:30 PM Medical Record Number: [id number] Patient Account Number: [id number] Date of Birth/Sex: 10/18/1934 (80 y.o. Female) Treating RN: Montey Hora Primary Care Physician: Dena Billet Other Clinician: Referring Physician: Dena Billet Treating Physician/Extender: Frann Rider in Treatment: 0 Verbal / Phone Orders: Yes Clinician: Montey Hora Read Back and Verified: Yes Diagnosis Coding Wound Cleansing Wound #1 Left Forearm o Clean wound with Normal Saline. Anesthetic Wound #1 Left Forearm o Topical Lidocaine 4% cream applied to wound bed prior to debridement Primary Wound Dressing Wound #1 Left Forearm o petroleum gauze Secondary Dressing Wound #1 Left Forearm o Conform/Kerlix o Non-adherent pad Dressing Change Frequency Wound #1 Left Forearm o Change dressing every week Follow-up Appointments Wound #1 Left Forearm o Return Appointment in 1 week. Electronic Signature(s) Signed: 03/06/2015 4:41:39 PM By: Christin Fudge MD, FACS Signed: 03/06/2015 4:45:24 PM By: Montey Hora Entered By: Montey Hora on 03/06/2015 15:12:25 Ohm, Jazzmyn M. ([id number]) -------------------------------------------------------------------------------- Problem List Details Patient Name: Toppin, Xylia M. Date of Service: 03/06/2015 2:30 PM Medical Record Number: [id number] Patient Account Number: [id number] Date of  Birth/Sex: 10-Nov-1934 (80 y.o. Female) Treating RN: Primary Care Physician: Dena Billet Other Clinician: Referring Physician: Dena Billet Treating Physician/Extender: Frann Rider in Treatment: 0 Active Problems ICD-10 Encounter Code Description Active Date Diagnosis S51.812A Laceration without foreign body of left forearm, initial 03/06/2015 Yes encounter Inactive Problems Resolved Problems Electronic Signature(s) Signed: 03/06/2015 4:41:39 PM By: Christin Fudge MD, FACS Entered By: Christin Fudge on 03/06/2015 15:35:54 Therien, Denea M. ([id number]) -------------------------------------------------------------------------------- Progress Note Details Patient Name: Hoskin, Gracilyn M. Date of Service: 03/06/2015 2:30 PM Medical Record Number: [id number] Patient Account Number: [id number] Date of Birth/Sex: Yilmaz 09, 1936 (80 y.o. Female) Treating RN: Primary Care Physician: Dena Billet Other Clinician: Referring Physician: Dena Billet Treating Physician/Extender: Frann Rider in Treatment: 0 Subjective Chief Complaint Information obtained from Patient Patient presents to the wound care center for a consult due non healing wound. Very pleasant 80 year old with a injury to her left forearm about a month ago. History of Present Illness (HPI) The following HPI elements were documented for the patient's wound: Location: left forearm Quality: Patient reports No Pain. Severity: Patient states wound (s) are getting better. Duration: Patient has had the wound for < 5 weeks prior to presenting for treatment Context: The wound occurred when the patient injured her left forearm on a doorknob Modifying Factors: Other treatment(s) tried include:location care and 2 separate courses of anti-biotics Associated Signs and Symptoms: Patient reports presence of swelling 80 year old patient who comes in from her PCP with a history of having a injury to her left forearm about a month ago. She has been  seen several times with PCPs please office and has been given symptomatic treatment and has also been treated with local dressings and antibiotics. she has received Keflex for 7 days and now most recently she is on Bactrim. She stopped the Bactrim after 2 days because she developed an allergy The patient has a history of having atrial fibrillation as and is on Coumadin. her past medical history is also significant for cancer of the colon and left breast, coronary artery disease, atrial fibrillation, edema of extremities and pulmonary hypertension. Wound History Patient presents with 1 open wound that has been present for approximately 1 month. Patient has been treating wound in the following manner: tricimilone. Laboratory tests have been performed in the last month. Patient reportedly has not tested positive for an antibiotic resistant organism. Patient  reportedly has not tested positive for osteomyelitis. Patient reportedly has not had testing performed to evaluate circulation in the legs. Patient History Information obtained from Patient. Allergies Iodinated Contrast Media - IV Dye, Statins-Hmg-Coa Reductase Inhibitors, Celebrex, adhesive tape Strout, Shareta M. ([id number]) Family History Cancer - Siblings, Diabetes - Siblings, Heart Disease - Siblings, Hypertension - Siblings, Stroke - Siblings, Tuberculosis - Siblings, No family history of Hereditary Spherocytosis, Kidney Disease, Lung Disease, Seizures, Thyroid Problems. Social History Never smoker, Marital Status - Married, Alcohol Use - Never, Drug Use - No History, Caffeine Use - Never. Medical History Eyes Denies history of Cataracts, Glaucoma, Optic Neuritis Ear/Nose/Mouth/Throat Denies history of Chronic sinus problems/congestion, Middle ear problems Hematologic/Lymphatic Denies history of Anemia, Hemophilia, Human Immunodeficiency Virus, Lymphedema, Sickle Cell Disease Respiratory Patient has history of Chronic Obstructive  Pulmonary Disease (COPD) Denies history of Aspiration, Asthma, Pneumothorax, Sleep Apnea, Tuberculosis Cardiovascular Patient has history of Coronary Artery Disease, Hypertension Denies history of Angina, Arrhythmia, Congestive Heart Failure, Deep Vein Thrombosis, Hypotension, Myocardial Infarction, Peripheral Arterial Disease, Peripheral Venous Disease, Phlebitis, Vasculitis Gastrointestinal Denies history of Cirrhosis , Colitis, Crohn s, Hepatitis A, Hepatitis B, Hepatitis C Endocrine Denies history of Type I Diabetes, Type II Diabetes Genitourinary Denies history of End Stage Renal Disease Immunological Denies history of Lupus Erythematosus, Raynaud s, Scleroderma Integumentary (Skin) Denies history of History of Burn, History of pressure wounds Musculoskeletal Denies history of Gout, Rheumatoid Arthritis, Osteoarthritis, Osteomyelitis Neurologic Denies history of Dementia, Neuropathy, Quadriplegia, Paraplegia, Seizure Disorder Oncologic Patient has history of Received Chemotherapy - for colon cancer in 2003 Denies history of Received Radiation Medical And Surgical History Notes Gastrointestinal diverticulitis Review of Systems (ROS) Constitutional Symptoms (General Health) The patient has no complaints or symptoms. Eyes Complains or has symptoms of Glasses / Contacts - glasses. Ear/Nose/Mouth/Throat Rolph, [name] ([id number]) The patient has no complaints or symptoms. Hematologic/Lymphatic The patient has no complaints or symptoms. Respiratory The patient has no complaints or symptoms. Cardiovascular Complains or has symptoms of LE edema. Denies complaints or symptoms of Chest pain. Gastrointestinal Complains or has symptoms of Frequent diarrhea. Denies complaints or symptoms of Nausea, Vomiting. Endocrine The patient has no complaints or symptoms. Genitourinary Denies complaints or symptoms of Kidney failure/ Dialysis, Incontinence/dribbling. Immunological The  patient has no complaints or symptoms. Integumentary (Skin) The patient has no complaints or symptoms. Musculoskeletal The patient has no complaints or symptoms. Neurologic The patient has no complaints or symptoms. Medications sulfamethoxazole 800 mg-trimethoprim 160 mg tablet oral tablet oral Tylenol Extra Strength 500 mg tablet oral tablet oral warfarin 2.5 mg tablet oral tablet oral warfarin 3 mg tablet oral tablet oral loperamide 2 mg tablet oral tablet oral simvastatin 10 mg tablet oral 1 1 tablet oral losartan 100 mg tablet oral 1 1 tablet oral furosemide 20 mg tablet oral tablet oral omeprazole 20 mg tablet,delayed release oral 1 1 tablet,delayed release (DR/EC) oral triamcinolone acetonide 0.1 % topical cream topical cream topical nitroglycerin 0.4 mg sublingual tablet sublingual tablet, sublingual sublingual Vitamin D3 2,000 unit tablet oral 1 1 tablet oral Objective Constitutional Pulse regular. Respirations normal and unlabored. Afebrile. NAKOMA, GOTWALT ([id number]) Vitals Time Taken: 2:27 PM, Height: 66 in, Source: Stated, Weight: 226 lbs, Source: Stated, BMI: 36.5, Temperature: 98.1 F, Pulse: 49 bpm, Respiratory Rate: 18 breaths/min, Blood Pressure: 187/76 mmHg. Eyes Nonicteric. Reactive to light. Ears, Nose, Mouth, and Throat Lips, teeth, and gums WNL.Marland Kitchen Moist mucosa without lesions . Neck supple and nontender. No palpable supraclavicular or cervical adenopathy. Normal sized without goiter. Respiratory  WNL. No retractions.. Cardiovascular Pedal Pulses WNL. No clubbing, cyanosis or edema. Gastrointestinal (GI) Abdomen without masses or tenderness.. No liver or spleen enlargement or tenderness.. Musculoskeletal Adexa without tenderness or enlargement.. Digits and nails w/o clubbing, cyanosis, infection, petechiae, ischemia, or inflammatory conditions.Marland Kitchen Psychiatric Judgement and insight Intact.. No evidence of depression, anxiety, or agitation.. General Notes: on  the left forearm she has an apical eyes wound but surrounding it there is a lot of tape burn and some evidence of reaction to the adhesive. Integumentary (Hair, Skin) No suspicious lesions. No crepitus or fluctuance. No peri-wound warmth or erythema. No masses.. Wound #1 status is Open. Original cause of wound was Trauma. The wound is located on the Left Forearm. The wound measures 0.3cm length x 1.1cm width x 0.1cm depth; 0.259cm^2 area and 0.026cm^3 volume. The wound is limited to skin breakdown. There is no tunneling or undermining noted. There is a small amount of serous drainage noted. The wound margin is flat and intact. There is large (67-100%) pink granulation within the wound bed. There is no necrotic tissue within the wound bed. The periwound skin appearance did not exhibit: Callus, Crepitus, Excoriation, Fluctuance, Friable, Induration, Localized Edema, Rash, Scarring, Dry/Scaly, Maceration, Moist, Atrophie Blanche, Cyanosis, Ecchymosis, Hemosiderin Staining, Mottled, Pallor, Rubor, Erythema. Periwound temperature was noted as No Abnormality. AVYANA, PUFFENBARGER ([id number]) Assessment Active Problems ICD-10 [phone] - Laceration without foreign body of left forearm, initial encounter this patient has a wound which has almost completely healed but she has developed some tape burn and some reaction to the adhesive around the heel site. She also developed a antibody allergy to Bactrim. I have asked her to discontinue her anti-biotic. I'm going to recommend a moisturizer around this area a piece of Xeroform gauze and a light bandage not to be disturbed for the next week. I have asked her to change it if it gets wet but if not come and see me next week. Plan Wound Cleansing: Wound #1 Left Forearm: Clean wound with Normal Saline. Anesthetic: Wound #1 Left Forearm: Topical Lidocaine 4% cream applied to wound bed prior to debridement Primary Wound Dressing: Wound #1 Left  Forearm: petroleum gauze Secondary Dressing: Wound #1 Left Forearm: Conform/Kerlix Non-adherent pad Dressing Change Frequency: Wound #1 Left Forearm: Change dressing every week Follow-up Appointments: Wound #1 Left Forearm: Return Appointment in 1 week. ISLAY, POLANCO. ([id number]) This patient has a wound which has almost completely healed but she has developed some tape burn and some reaction to the adhesive around the heel site. She also developed a antibody allergy to Bactrim. I have asked her to discontinue her anti-biotic. I'm going to recommend a moisturizer around this area a piece of Xeroform gauze and a light bandage not to be disturbed for the next week. I have asked her to change it if it gets wet but if not come and see me next week. Electronic Signature(s) Signed: 03/06/2015 4:41:39 PM By: Christin Fudge MD, FACS Entered By: Christin Fudge on 03/06/2015 15:41:14 Adinolfi, Deshanae Jerilynn Mages ([id number]) -------------------------------------------------------------------------------- ROS/PFSH Details Patient Name: Weidemann, Willene M. Date of Service: 03/06/2015 2:30 PM Medical Record Number: [id number] Patient Account Number: [id number] Date of Birth/Sex: 12-23-1934 (80 y.o. Female) Treating RN: Montey Hora Primary Care Physician: Dena Billet Other Clinician: Referring Physician: Dena Billet Treating Physician/Extender: Frann Rider in Treatment: 0 Information Obtained From Patient Wound History Do you currently have one or more open woundso Yes How many open wounds do you currently haveo 1 Approximately how long have you had  your woundso 1 month How have you been treating your wound(s) until nowo tricimilone Has your wound(s) ever healed and then re-openedo No Have you had any lab work done in the past montho Yes Who ordered the lab work doneo PCP PT/INR Have you tested positive for an antibiotic resistant organism (MRSA, VRE)o No Have you tested positive for osteomyelitis (bone  infection)o No Have you had any tests for circulation on your legso No Eyes Complaints and Symptoms: Positive for: Glasses / Contacts - glasses Medical History: Negative for: Cataracts; Glaucoma; Optic Neuritis Cardiovascular Complaints and Symptoms: Positive for: LE edema Negative for: Chest pain Medical History: Positive for: Coronary Artery Disease; Hypertension Negative for: Angina; Arrhythmia; Congestive Heart Failure; Deep Vein Thrombosis; Hypotension; Myocardial Infarction; Peripheral Arterial Disease; Peripheral Venous Disease; Phlebitis; Vasculitis Gastrointestinal Complaints and Symptoms: Positive for: Frequent diarrhea Negative for: Nausea; Vomiting Medical History: Negative for: Cirrhosis ; Colitis; Crohnos; Hepatitis A; Hepatitis B; Hepatitis C Past Medical History Notes: ZAKAYLA, MARTINEC ([id number]) diverticulitis Genitourinary Complaints and Symptoms: Negative for: Kidney failure/ Dialysis; Incontinence/dribbling Medical History: Negative for: End Stage Renal Disease Constitutional Symptoms (General Health) Complaints and Symptoms: No Complaints or Symptoms Ear/Nose/Mouth/Throat Complaints and Symptoms: No Complaints or Symptoms Medical History: Negative for: Chronic sinus problems/congestion; Middle ear problems Hematologic/Lymphatic Complaints and Symptoms: No Complaints or Symptoms Medical History: Negative for: Anemia; Hemophilia; Human Immunodeficiency Virus; Lymphedema; Sickle Cell Disease Respiratory Complaints and Symptoms: No Complaints or Symptoms Medical History: Positive for: Chronic Obstructive Pulmonary Disease (COPD) Negative for: Aspiration; Asthma; Pneumothorax; Sleep Apnea; Tuberculosis Endocrine Complaints and Symptoms: No Complaints or Symptoms Medical History: Negative for: Type I Diabetes; Type II Diabetes Immunological Complaints and Symptoms: No Complaints or Symptoms JURI, DINNING. ([id number]) Medical History: Negative for:  Lupus Erythematosus; Raynaudos; Scleroderma Integumentary (Skin) Complaints and Symptoms: No Complaints or Symptoms Medical History: Negative for: History of Burn; History of pressure wounds Musculoskeletal Complaints and Symptoms: No Complaints or Symptoms Medical History: Negative for: Gout; Rheumatoid Arthritis; Osteoarthritis; Osteomyelitis Neurologic Complaints and Symptoms: No Complaints or Symptoms Medical History: Negative for: Dementia; Neuropathy; Quadriplegia; Paraplegia; Seizure Disorder Oncologic Medical History: Positive for: Received Chemotherapy - for colon cancer in 2003 Negative for: Received Radiation Family and Social History Cancer: Yes - Siblings; Diabetes: Yes - Siblings; Heart Disease: Yes - Siblings; Hereditary Spherocytosis: No; Hypertension: Yes - Siblings; Kidney Disease: No; Lung Disease: No; Seizures: No; Stroke: Yes - Siblings; Thyroid Problems: No; Tuberculosis: Yes - Siblings; Never smoker; Marital Status - Married; Alcohol Use: Never; Drug Use: No History; Caffeine Use: Never; Financial Concerns: No; Food, Clothing or Shelter Needs: No; Support System Lacking: No; Transportation Concerns: No; Advanced Directives: No; Patient does not want information on Advanced Directives Physician Affirmation I have reviewed and agree with the above information. Electronic Signature(s) Signed: 03/06/2015 4:41:39 PM By: Christin Fudge MD, FACS Signed: 03/06/2015 4:45:24 PM By: Montey Hora Entered By: Christin Fudge on 03/06/2015 15:39:02 Pellicano, MALEIYA PERGOLA ([id number]) SARALYN, WILLISON. ([id number]) -------------------------------------------------------------------------------- SuperBill Details Patient Name: Prisk, Dezyrae M. Date of Service: 03/06/2015 Medical Record Number: [id number] Patient Account Number: [id number] Date of Birth/Sex: [birth year] (80 y.o. Female) Treating RN: Primary Care Physician: Dena Billet Other Clinician: Referring Physician: Dena Billet Treating  Physician/Extender: Frann Rider in Treatment: 0 Diagnosis Coding ICD-10 Codes Code Description [phone] Laceration without foreign body of left forearm, initial encounter Facility Procedures CPT4 Code: [id number] Description: 99214 - WOUND CARE VISIT-LEV 4 EST PT Modifier: Quantity: 1 Physician Procedures CPT4 Code Description: [id number] - WC PHYS LEVEL 4 -  NEW PT ICD-10 Description Diagnosis S51.812A Laceration without foreign body of left forearm, init Modifier: ial encounter Quantity: 1 Electronic Signature(s) Signed: 03/06/2015 4:41:39 PM By: Christin Fudge MD, FACS Entered By: Christin Fudge on 03/06/2015 15:41:25

## 2015-03-07 NOTE — Progress Notes (Signed)
[name], [name] ([id number]) Visit Report for 03/06/2015 Abuse/Suicide Risk Screen Details Patient Name: [name], [name]. Date of Service: 03/06/2015 2:30 PM Medical Record Number: [id number] Patient Account Number: [id number] Date of Birth/Sex: 1934/11/19 (80 y.o. Female) Treating RN: Montey Hora Primary Care Physician: Dena Billet Other Clinician: Referring Physician: Dena Billet Treating Physician/Extender: Frann Rider in Treatment: 0 Abuse/Suicide Risk Screen Items Answer ABUSE/SUICIDE RISK SCREEN: Has anyone close to you tried to hurt or harm you recentlyo No Do you feel uncomfortable with anyone in your familyo No Has anyone forced you do things that you didnot want to doo No Do you have any thoughts of harming yourselfo No Patient displays signs or symptoms of abuse and/or neglect. No Electronic Signature(s) Signed: 03/06/2015 4:45:24 PM By: Montey Hora Entered By: Montey Hora on 03/06/2015 14:55:38 Fern, Smita M. ([id number]) -------------------------------------------------------------------------------- Activities of Daily Living Details Patient Name: [name], [name] Date of Service: 03/06/2015 2:30 PM Medical Record Number: [id number] Patient Account Number: [id number] Date of Birth/Sex: [birth year] (80 y.o. Female) Treating RN: Montey Hora Primary Care Physician: Dena Billet Other Clinician: Referring Physician: Dena Billet Treating Physician/Extender: Frann Rider in Treatment: 0 Activities of Daily Living Items Answer Activities of Daily Living (Please select one for each item) Drive Automobile Completely Able Take Medications Completely Able Use Telephone Completely Able Care for Appearance Completely Able Use Toilet Completely Able Bath / Shower Completely Able Dress Self Completely Able Feed Self Completely Able Walk Completely Able Get In / Out Bed Completely Able Housework Completely Able Prepare Meals Completely Volin for Self Completely Able Electronic Signature(s) Signed: 03/06/2015 4:45:24 PM By: Montey Hora Entered By: Montey Hora on 03/06/2015 14:56:00 Mcclellan, Avleen Jerilynn [name] ([id number]) -------------------------------------------------------------------------------- Education Assessment Details Patient Name: [name], [name] Date of Service: 03/06/2015 2:30 PM Medical Record Number: [id number] Patient Account Number: [id number] Date of Birth/Sex: 06/17/1935 (80 y.o. Female) Treating RN: Montey Hora Primary Care Physician: Dena Billet Other Clinician: Referring Physician: Dena Billet Treating Physician/Extender: Frann Rider in Treatment: 0 Primary Learner Assessed: Patient Learning Preferences/Education Level/Primary Language Learning Preference: Explanation, Demonstration Highest Education Level: High School Preferred Language: English Cognitive Barrier Assessment/Beliefs Language Barrier: No Translator Needed: No Memory Deficit: No Emotional Barrier: No Cultural/Religious Beliefs Affecting Medical No Care: Physical Barrier Assessment Impaired Vision: No Impaired Hearing: No Decreased Hand dexterity: No Knowledge/Comprehension Assessment Knowledge Level: Medium Comprehension Level: Medium Ability to understand written Medium instructions: Ability to understand verbal Medium instructions: Motivation Assessment Anxiety Level: Calm Cooperation: Cooperative Education Importance: Acknowledges Need Interest in Health Problems: Asks Questions Perception: Coherent Willingness to Engage in Self- Medium Management Activities: Readiness to Engage in Self- Medium Management Activities: Electronic Signature(s) [name], [name] ([id number]) Signed: 03/06/2015 4:45:24 PM By: Montey Hora Entered By: Montey Hora on 03/06/2015 14:56:26 Labell, Syann Jerilynn [name] ([id number]) -------------------------------------------------------------------------------- Fall Risk Assessment  Details Patient Name: [name], [name] Date of Service: 03/06/2015 2:30 PM Medical Record Number: [id number] Patient Account Number: [id number] Date of Birth/Sex: July 29, 1935 (80 y.o. Female) Treating RN: Montey Hora Primary Care Physician: Dena Billet Other Clinician: Referring Physician: Dena Billet Treating Physician/Extender: Frann Rider in Treatment: 0 Fall Risk Assessment Items FALL RISK ASSESSMENT: History of falling - immediate or within 3 months 0 No Secondary diagnosis 0 No Ambulatory aid None/bed rest/wheelchair/nurse 0 Yes Crutches/cane/walker 0 No Furniture 0 No IV Access/Saline Lock 0 No Gait/Training Normal/bed rest/immobile 0 Yes Weak 0 No Impaired 0 No Mental Status Oriented to own ability 0 Yes Electronic Signature(s) Signed: 03/06/2015 4:45:24  PM By: Montey Hora Entered By: Montey Hora on 03/06/2015 14:56:36 [name], [name] ([id number]) -------------------------------------------------------------------------------- Nutrition Risk Assessment Details Patient Name: [name], [name]. Date of Service: 03/06/2015 2:30 PM Medical Record Number: [id number] Patient Account Number: [id number] Date of Birth/Sex: 1935-01-31 (80 y.o. Female) Treating RN: Montey Hora Primary Care Physician: Dena Billet Other Clinician: Referring Physician: Dena Billet Treating Physician/Extender: Frann Rider in Treatment: 0 Height (in): 66 Weight (lbs): 226 Body Mass Index (BMI): 36.5 Nutrition Risk Assessment Items NUTRITION RISK SCREEN: I have an illness or condition that made me change the kind and/or 0 No amount of food I eat I eat fewer than two meals per day 0 No I eat few fruits and vegetables, or milk products 0 No I have three or more drinks of beer, liquor or wine almost every day 0 No I have tooth or mouth problems that make it hard for me to eat 0 No I don't always have enough money to buy the food I need 0 No I eat alone most of the time 0 No I take  three or more different prescribed or over-the-counter drugs a 1 Yes day Without wanting to, I have lost or gained 10 pounds in the last six 0 No months I am not always physically able to shop, cook and/or feed myself 0 No Nutrition Protocols Good Risk Protocol 0 No interventions needed Moderate Risk Protocol Electronic Signature(s) Signed: 03/06/2015 4:45:24 PM By: Montey Hora Entered By: Montey Hora on 03/06/2015 14:56:46

## 2015-03-13 ENCOUNTER — Encounter: Payer: Medicare Other | Attending: Surgery | Admitting: Surgery

## 2015-03-13 DIAGNOSIS — X58XXXA Exposure to other specified factors, initial encounter: Secondary | ICD-10-CM | POA: Diagnosis not present

## 2015-03-13 DIAGNOSIS — S51812A Laceration without foreign body of left forearm, initial encounter: Secondary | ICD-10-CM | POA: Insufficient documentation

## 2015-03-14 NOTE — Progress Notes (Signed)
VERNELL, BACK ([id number]) Visit Report for 03/13/2015 Arrival Information Details Patient Name: [name]. Date of Service: 03/13/2015 11:00 AM Medical Record Number: [id number] Patient Account Number: [id number] Date of Birth/Sex: 06/22/1935 (80 y.o. Female) Treating RN: Afful, RN, BSN, Velva Harman Primary Care Physician: Dena Billet Other Clinician: Referring Physician: Dena Billet Treating Physician/Extender: Frann Rider in Treatment: 1 Visit Information History Since Last Visit Added or deleted any medications: No Patient Arrived: Ambulatory Any new allergies or adverse reactions: No Arrival Time: 10:57 Had a fall or experienced change in No Accompanied By: self activities of daily living that A[place] affect Transfer Assistance: None risk of falls: Patient Identification Verified: Yes Signs or symptoms of abuse/neglect since last No Secondary Verification Process Yes visito Completed: Hospitalized since last visit: No Patient Has Alerts: Yes Has Dressing in Place as Prescribed: Yes Patient Alerts: Patient on Blood Pain Present Now: No Thinner warfarin NO BP LEFT ARM Electronic Signature(s) Signed: 03/13/2015 4:18:25 PM By: Regan Lemming BSN, RN Entered By: Regan Lemming on 03/13/2015 10:57:24 Colaizzi, Navya M. ([id number]) -------------------------------------------------------------------------------- Clinic Level of Care Assessment Details Patient Name: [name], [name] Date of Service: 03/13/2015 11:00 AM Medical Record Number: [id number] Patient Account Number: [id number] Date of Birth/Sex: 12/02/1934 (80 y.o. Female) Treating RN: Afful, RN, BSN, [place] Primary Care Physician: Dena Billet Other Clinician: Referring Physician: Dena Billet Treating Physician/Extender: Frann Rider in Treatment: 1 Clinic Level of Care Assessment Items TOOL 4 Quantity Score []  - Use when only an EandM is performed on FOLLOW-UP visit 0 ASSESSMENTS - Nursing Assessment / Reassessment X - Reassessment  of Co-morbidities (includes updates in patient status) 1 10 X - Reassessment of Adherence to Treatment Plan 1 5 ASSESSMENTS - Wound and Skin Assessment / Reassessment X - Simple Wound Assessment / Reassessment - one wound 1 5 []  - Complex Wound Assessment / Reassessment - multiple wounds 0 []  - Dermatologic / Skin Assessment (not related to wound area) 0 ASSESSMENTS - Focused Assessment []  - Circumferential Edema Measurements - multi extremities 0 []  - Nutritional Assessment / Counseling / Intervention 0 []  - Lower Extremity Assessment (monofilament, tuning fork, pulses) 0 []  - Peripheral Arterial Disease Assessment (using hand held doppler) 0 ASSESSMENTS - Ostomy and/or Continence Assessment and Care []  - Incontinence Assessment and Management 0 []  - Ostomy Care Assessment and Management (repouching, etc.) 0 PROCESS - Coordination of Care []  - Simple Patient / Family Education for ongoing care 0 []  - Complex (extensive) Patient / Family Education for ongoing care 0 []  - Staff obtains Programmer, systems, Records, Test Results / Process Orders 0 []  - Staff telephones HHA, Nursing Homes / Clarify orders / etc 0 []  - Routine Transfer to another Facility (non-emergent condition) 0 Awad, Giulietta M. ([id number]) []  - Routine Hospital Admission (non-emergent condition) 0 []  - New Admissions / Biomedical engineer / Ordering NPWT, Apligraf, etc. 0 []  - Emergency Hospital Admission (emergent condition) 0 X - Simple Discharge Coordination 1 10 []  - Complex (extensive) Discharge Coordination 0 PROCESS - Special Needs []  - Pediatric / Minor Patient Management 0 []  - Isolation Patient Management 0 []  - Hearing / Language / Visual special needs 0 []  - Assessment of Community assistance (transportation, D/C planning, etc.) 0 []  - Additional assistance / Altered mentation 0 []  - Support Surface(s) Assessment (bed, cushion, seat, etc.) 0 INTERVENTIONS - Wound Cleansing / Measurement []  - Simple Wound Cleansing  - one wound 0 []  - Complex Wound Cleansing - multiple wounds 0 X - Wound Imaging (photographs -  any number of wounds) 1 5 []  - Wound Tracing (instead of photographs) 0 []  - Simple Wound Measurement - one wound 0 []  - Complex Wound Measurement - multiple wounds 0 INTERVENTIONS - Wound Dressings []  - Small Wound Dressing one or multiple wounds 0 []  - Medium Wound Dressing one or multiple wounds 0 []  - Large Wound Dressing one or multiple wounds 0 []  - Application of Medications - topical 0 []  - Application of Medications - injection 0 INTERVENTIONS - Miscellaneous []  - External ear exam 0 Batty, Sreeja M. ([id number]) []  - Specimen Collection (cultures, biopsies, blood, body fluids, etc.) 0 []  - Specimen(s) / Culture(s) sent or taken to Lab for analysis 0 []  - Patient Transfer (multiple staff / Harrel Lemon Lift / Similar devices) 0 []  - Simple Staple / Suture removal (25 or less) 0 []  - Complex Staple / Suture removal (26 or more) 0 []  - Hypo / Hyperglycemic Management (close monitor of Blood Glucose) 0 []  - Ankle / Brachial Index (ABI) - do not check if billed separately 0 X - Vital Signs 1 5 Has the patient been seen at the hospital within the last three years: Yes Total Score: 40 Level Of Care: New/Established - Level 2 Electronic Signature(s) Signed: 03/13/2015 4:18:25 PM By: Regan Lemming BSN, RN Entered By: Regan Lemming on 03/13/2015 11:04:37 Mun, Geniva Jerilynn Mages ([id number]) -------------------------------------------------------------------------------- Encounter Discharge Information Details Patient Name: [name], [name] Date of Service: 03/13/2015 11:00 AM Medical Record Number: [id number] Patient Account Number: [id number] Date of Birth/Sex: 02/25/1935 (80 y.o. Female) Treating RN: Baruch Gouty, RN, BSN, Velva Harman Primary Care Physician: Dena Billet Other Clinician: Referring Physician: Dena Billet Treating Physician/Extender: Frann Rider in Treatment: 1 Encounter Discharge Information Items Discharge  Pain Level: 0 Discharge Condition: Stable Ambulatory Status: Ambulatory Discharge Destination: Home Private Transportation: Auto Accompanied By: self Schedule Follow-up Appointment: No Medication Reconciliation completed and No provided to Patient/Care [place]: Clinical Summary of Care: Electronic Signature(s) Signed: 03/13/2015 4:18:25 PM By: Regan Lemming BSN, RN Entered By: Regan Lemming on 03/13/2015 11:04:55 Bertoni, Hydia Jerilynn Mages ([id number]) -------------------------------------------------------------------------------- Lower Extremity Assessment Details Patient Name: Kenealy, Shunta M. Date of Service: 03/13/2015 11:00 AM Medical Record Number: [id number] Patient Account Number: [id number] Date of Birth/Sex: October 24, 1934 (80 y.o. Female) Treating RN: Afful, RN, BSN, Velva Harman Primary Care Physician: Dena Billet Other Clinician: Referring Physician: Dena Billet Treating Physician/Extender: Frann Rider in Treatment: 1 Electronic Signature(s) Signed: 03/13/2015 4:18:25 PM By: Regan Lemming BSN, RN Entered By: Regan Lemming on 03/13/2015 11:00:13 Manfred Arch ([id number]) -------------------------------------------------------------------------------- [place] Details Patient Name: LARYSSA, HASSING. Date of Service: 03/13/2015 11:00 AM Medical Record Number: [id number] Patient Account Number: [id number] Date of Birth/Sex: [birth year] (80 y.o. Female) Treating RN: Afful, RN, BSN, Velva Harman Primary Care Physician: Dena Billet Other Clinician: Referring Physician: Dena Billet Treating Physician/Extender: Frann Rider in Treatment: 1 Active Inactive Electronic Signature(s) Signed: 03/13/2015 4:18:25 PM By: Regan Lemming BSN, RN Entered By: Regan Lemming on 03/13/2015 11:03:15 Milhorn, [name] ([id number]) -------------------------------------------------------------------------------- Pain Assessment Details Patient Name: Ciolino, Avina M. Date of Service: 03/13/2015 11:00 AM Medical Record Number:  [id number] Patient Account Number: [id number] Date of Birth/Sex: 08/07/1935 (80 y.o. Female) Treating RN: Baruch Gouty, RN, BSN, Velva Harman Primary Care Physician: Dena Billet Other Clinician: Referring Physician: Dena Billet Treating Physician/Extender: Frann Rider in Treatment: 1 Active Problems Location of Pain Severity and Description of Pain Patient Has Paino No Site Locations Pain Management and Medication Current Pain Management: Electronic Signature(s) Signed: 03/13/2015 4:18:25 PM By: Regan Lemming BSN, RN  Entered By: Regan Lemming on 03/13/2015 10:57:40 Damon, [name] ([id number]) -------------------------------------------------------------------------------- Patient/Caregiver Education Details Patient Name: Culverhouse, Maeby M. Date of Service: 03/13/2015 11:00 AM Medical Record Number: [id number] Patient Account Number: [id number] Date of Birth/Gender: 1934-11-24 (80 y.o. Female) Treating RN: Afful, RN, BSN, Velva Harman Primary Care Physician: Dena Billet Other Clinician: Referring Physician: Dena Billet Treating Physician/Extender: Frann Rider in Treatment: 1 Education Assessment Education Provided To: Patient Education Topics Provided Basic Hygiene: Methods: Explain/Verbal Responses: State content correctly Electronic Signature(s) Signed: 03/13/2015 4:18:25 PM By: Regan Lemming BSN, RN Entered By: Regan Lemming on 03/13/2015 11:05:09 Barile, [name] ([id number]) -------------------------------------------------------------------------------- Wound Assessment Details Patient Name: Dietzman, Keyatta M. Date of Service: 03/13/2015 11:00 AM Medical Record Number: [id number] Patient Account Number: [id number] Date of Birth/Sex: 10-Feb-1935 (80 y.o. Female) Treating RN: Afful, RN, BSN, [place] Primary Care Physician: Dena Billet Other Clinician: Referring Physician: Dena Billet Treating Physician/Extender: Frann Rider in Treatment: 1 Wound Status Wound Number: 1 Primary Trauma, Other Etiology: Wound  Location: Left Forearm Wound Open Wounding Event: Trauma Status: Date Acquired: 02/02/2015 Comorbid Chronic Obstructive Pulmonary Weeks Of Treatment: 1 History: Disease (COPD), Coronary Artery Clustered Wound: No Disease, Hypertension, Received Chemotherapy Photos Photo Uploaded By: Regan Lemming on 03/13/2015 16:15:53 Wound Measurements Length: (cm) 0 % Reduction i Width: (cm) 0 % Reduction i Depth: (cm) 0 Epithelializa Area: (cm) 0 Tunneling: Volume: (cm) 0 Undermining: n Area: 100% n Volume: 100% tion: Large (67-100%) No No Wound Description Classification: Partial Thickness Wound Margin: Flat and Intact Exudate Amount: None Present Foul Odor After Cleansing: No Wound Bed Granulation Amount: Large (67-100%) Exposed Structure Granulation Quality: Pink Fascia Exposed: No Necrotic Amount: None Present (0%) Fat Layer Exposed: No Tendon Exposed: No Muscle Exposed: No Waight, Keshia M. ([id number]) Joint Exposed: No Bone Exposed: No Limited to Skin Breakdown Periwound Skin Texture Texture Color No Abnormalities Noted: No No Abnormalities Noted: No Callus: No Atrophie Blanche: No Crepitus: No Cyanosis: No Excoriation: No Ecchymosis: No Fluctuance: No Erythema: No Friable: No Hemosiderin Staining: No Induration: No Mottled: No Localized Edema: No Pallor: No Rash: No Rubor: No Scarring: No Temperature / Pain Moisture Temperature: No Abnormality No Abnormalities Noted: No Dry / Scaly: Yes Maceration: No Moist: No Wound Preparation Ulcer Cleansing: Rinsed/Irrigated with Saline Electronic Signature(s) Signed: 03/13/2015 4:18:25 PM By: Regan Lemming BSN, RN Entered By: Regan Lemming on 03/13/2015 11:00:39 Slyter, [name] ([id number]) -------------------------------------------------------------------------------- Vitals Details Patient Name: Salvino, Jatoria M. Date of Service: 03/13/2015 11:00 AM Medical Record Number: [id number] Patient Account Number: [id number] Date  of Birth/Sex: 07/29/1935 (80 y.o. Female) Treating RN: Afful, RN, BSN, [place] Primary Care Physician: Dena Billet Other Clinician: Referring Physician: Dena Billet Treating Physician/Extender: Frann Rider in Treatment: 1 Vital Signs Time Taken: 10:57 Temperature (F): 98.1 Height (in): 66 Pulse (bpm): 56 Weight (lbs): 226 Respiratory Rate (breaths/min): 16 Body Mass Index (BMI): 36.5 Blood Pressure (mmHg): 172/61 Reference Range: 80 - 120 mg / dl Electronic Signature(s) Signed: 03/13/2015 4:18:25 PM By: Regan Lemming BSN, RN Entered By: Regan Lemming on 03/13/2015 11:00:07

## 2015-03-14 NOTE — Progress Notes (Signed)
Susan, [name] ([id number]) Visit Report for 03/13/2015 Chief Complaint Document Details Patient Name: [name], [name]. Date of Service: 03/13/2015 11:00 AM Medical Record Number: [id number] Patient Account Number: [id number] Date of Birth/Sex: 01-Nov-1934 (80 y.o. Female) Treating RN: Primary Care Physician: Dena Billet Other Clinician: Referring Physician: Dena Billet Treating Physician/Extender: Frann Rider in Treatment: 1 Information Obtained from: Patient Chief Complaint Patient presents to the wound care center for a consult due non healing wound. Very pleasant 80 year old with a injury to her left forearm about a month ago. Electronic Signature(s) Signed: 03/13/2015 11:04:39 AM By: Christin Fudge MD, FACS Entered By: Christin Fudge on 03/13/2015 11:04:38 Hogate, [name] ([id number]) -------------------------------------------------------------------------------- HPI Details Patient Name: Susan, [name]. Date of Service: 03/13/2015 11:00 AM Medical Record Number: [id number] Patient Account Number: [id number] Date of Birth/Sex: 12-11-1934 (80 y.o. Female) Treating RN: Primary Care Physician: Dena Billet Other Clinician: Referring Physician: Dena Billet Treating Physician/Extender: Frann Rider in Treatment: 1 History of Present Illness Location: left forearm Quality: Patient reports No Pain. Severity: Patient states wound (s) are getting better. Duration: Patient has had the wound for < 5 weeks prior to presenting for treatment Context: The wound occurred when the patient injured her left forearm on a doorknob Modifying Factors: Other treatment(s) tried include:location care and 2 separate courses of anti-biotics Associated Signs and Symptoms: Patient reports presence of swelling HPI Description: 80 year old patient who comes in from her PCP with a history of having a injury to her left forearm about a month ago. She has been seen several times with PCPs please office and has been  given symptomatic treatment and has also been treated with local dressings and antibiotics. she has received Keflex for 7 days and now most recently she is on Bactrim. She stopped the Bactrim after 2 days because she developed an allergy The patient has a history of having atrial fibrillation as and is on Coumadin. her past medical history is also significant for cancer of the colon and left breast, coronary artery disease, atrial fibrillation, edema of extremities and pulmonary hypertension. Electronic Signature(s) Signed: 03/13/2015 11:04:43 AM By: Christin Fudge MD, FACS Entered By: Christin Fudge on 03/13/2015 11:04:43 Flaum, Lenyx Jerilynn Mages ([id number]) -------------------------------------------------------------------------------- Physical Exam Details Patient Name: Susan, Sacred M. Date of Service: 03/13/2015 11:00 AM Medical Record Number: [id number] Patient Account Number: [id number] Date of Birth/Sex: [birth year] (80 y.o. Female) Treating RN: Primary Care Physician: Dena Billet Other Clinician: Referring Physician: Dena Billet Treating Physician/Extender: Frann Rider in Treatment: 1 Constitutional . Pulse regular. Respirations normal and unlabored. Afebrile. . Eyes Nonicteric. Reactive to light. Ears, Nose, Mouth, and Throat Lips, teeth, and gums WNL.Marland Kitchen Moist mucosa without lesions . Neck supple and nontender. No palpable supraclavicular or cervical adenopathy. Normal sized without goiter. Respiratory WNL. No retractions.. Cardiovascular Pedal Pulses WNL. No clubbing, cyanosis or edema. Chest Breasts symmetical and no nipple discharge.. Breast tissue WNL, no masses, lumps, or tenderness.. Musculoskeletal Adexa without tenderness or enlargement.. Digits and nails w/o clubbing, cyanosis, infection, petechiae, ischemia, or inflammatory conditions.. Integumentary (Hair, Skin) No suspicious lesions. No crepitus or fluctuance. No peri-wound warmth or erythema. No  masses.Marland Kitchen Psychiatric Judgement and insight Intact.. No evidence of depression, anxiety, or agitation.. Notes the wound is completely healed and the redness around the wound has completely disappeared. Electronic Signature(s) Signed: 03/13/2015 11:05:11 AM By: Christin Fudge MD, FACS Entered By: Christin Fudge on 03/13/2015 11:05:11 Stencil, [name] ([id number]) -------------------------------------------------------------------------------- Physician Orders Details Patient Name: Susan, Shereese M. Date of Service: 03/13/2015 11:00 AM  Medical Record Number: [id number] Patient Account Number: [id number] Date of Birth/Sex: Nov 02, 1934 (80 y.o. Female) Treating RN: Afful, RN, BSN, Velva Harman Primary Care Physician: Dena Billet Other Clinician: Referring Physician: Dena Billet Treating Physician/Extender: Frann Rider in Treatment: 1 Verbal / Phone Orders: Yes Clinician: Afful, RN, BSN, Rita Read Back and Verified: Yes Diagnosis Coding Discharge From [facility] o Discharge from [place] completed. Electronic Signature(s) Signed: 03/13/2015 12:23:30 PM By: Christin Fudge MD, FACS Signed: 03/13/2015 4:18:25 PM By: Regan Lemming BSN, RN Entered By: Regan Lemming on 03/13/2015 11:04:01 Marti, [name] ([id number]) -------------------------------------------------------------------------------- Problem List Details Patient Name: Susan, Sosha M. Date of Service: 03/13/2015 11:00 AM Medical Record Number: [id number] Patient Account Number: [id number] Date of Birth/Sex: 1934/09/13 (80 y.o. Female) Treating RN: Primary Care Physician: Dena Billet Other Clinician: Referring Physician: Dena Billet Treating Physician/Extender: Frann Rider in Treatment: 1 Active Problems ICD-10 Encounter Code Description Active Date Diagnosis S51.812A Laceration without foreign body of left forearm, initial 03/06/2015 Yes encounter Inactive Problems Resolved Problems Electronic Signature(s) Signed:  03/13/2015 11:04:32 AM By: Christin Fudge MD, FACS Entered By: Christin Fudge on 03/13/2015 11:04:32 Loadholt, Jaedan M. ([id number]) -------------------------------------------------------------------------------- Progress Note Details Patient Name: Briney, Chellsie M. Date of Service: 03/13/2015 11:00 AM Medical Record Number: [id number] Patient Account Number: [id number] Date of Birth/Sex: [birth year] (80 y.o. Female) Treating RN: Primary Care Physician: Dena Billet Other Clinician: Referring Physician: Dena Billet Treating Physician/Extender: Frann Rider in Treatment: 1 Subjective Chief Complaint Information obtained from Patient Patient presents to the wound care center for a consult due non healing wound. Very pleasant 80 year old with a injury to her left forearm about a month ago. History of Present Illness (HPI) The following HPI elements were documented for the patient's wound: Location: left forearm Quality: Patient reports No Pain. Severity: Patient states wound (s) are getting better. Duration: Patient has had the wound for < 5 weeks prior to presenting for treatment Context: The wound occurred when the patient injured her left forearm on a doorknob Modifying Factors: Other treatment(s) tried include:location care and 2 separate courses of anti-biotics Associated Signs and Symptoms: Patient reports presence of swelling 81 year old patient who comes in from her PCP with a history of having a injury to her left forearm about a month ago. She has been seen several times with PCPs please office and has been given symptomatic treatment and has also been treated with local dressings and antibiotics. she has received Keflex for 7 days and now most recently she is on Bactrim. She stopped the Bactrim after 2 days because she developed an allergy The patient has a history of having atrial fibrillation as and is on Coumadin. her past medical history is also significant for cancer of the colon and  left breast, coronary artery disease, atrial fibrillation, edema of extremities and pulmonary hypertension. Objective Constitutional Pulse regular. Respirations normal and unlabored. Afebrile. Vitals Time Taken: 10:57 AM, Height: 66 in, Weight: 226 lbs, BMI: 36.5, Temperature: 98.1 F, Pulse: 56 bpm, Respiratory Rate: 16 breaths/min, Blood Pressure: 172/61 mmHg. FLAVIA, BRUSS. ([id number]) Eyes Nonicteric. Reactive to light. Ears, Nose, Mouth, and Throat Lips, teeth, and gums WNL.Marland Kitchen Moist mucosa without lesions . Neck supple and nontender. No palpable supraclavicular or cervical adenopathy. Normal sized without goiter. Respiratory WNL. No retractions.. Cardiovascular Pedal Pulses WNL. No clubbing, cyanosis or edema. Chest Breasts symmetical and no nipple discharge.. Breast tissue WNL, no masses, lumps, or tenderness.. Musculoskeletal Adexa without tenderness or enlargement.. Digits and nails w/o clubbing, cyanosis, infection,  petechiae, ischemia, or inflammatory conditions.Marland Kitchen Psychiatric Judgement and insight Intact.. No evidence of depression, anxiety, or agitation.. General Notes: the wound is completely healed and the redness around the wound has completely disappeared. Integumentary (Hair, Skin) No suspicious lesions. No crepitus or fluctuance. No peri-wound warmth or erythema. No masses.. Wound #1 status is Open. Original cause of wound was Trauma. The wound is located on the Left Forearm. The wound measures 0cm length x 0cm width x 0cm depth; 0cm^2 area and 0cm^3 volume. The wound is limited to skin breakdown. There is no tunneling or undermining noted. There is a none present amount of drainage noted. The wound margin is flat and intact. There is large (67-100%) pink granulation within the wound bed. There is no necrotic tissue within the wound bed. The periwound skin appearance exhibited: Dry/Scaly. The periwound skin appearance did not exhibit: Callus, Crepitus, Excoriation,  Fluctuance, Friable, Induration, Localized Edema, Rash, Scarring, Maceration, Moist, Atrophie Blanche, Cyanosis, Ecchymosis, Hemosiderin Staining, Mottled, Pallor, Rubor, Erythema. Periwound temperature was noted as No Abnormality. Assessment Active Problems ICD-10 KEMIYA, BATDORF ([id number]) S51.812A - Laceration without foreign body of left forearm, initial encounter The wound is completely healed and the area around has normal skin. She is discharged from the wound care services and will be seen back as needed. [facility] Discharge from [facility] completed. The wound is completely healed and the area around has normal skin. She is discharged from the wound care services and will be seen back as needed. Electronic Signature(s) Signed: 03/13/2015 11:05:53 AM By: Christin Fudge MD, FACS Entered By: Christin Fudge on 03/13/2015 11:05:53 Verdejo, Kelsha Jerilynn Mages ([id number]) -------------------------------------------------------------------------------- SuperBill Details Patient Name: Lamons, Rakayla M. Date of Service: 03/13/2015 Medical Record Number: [id number] Patient Account Number: [id number] Date of Birth/Sex: 05/23/1935 (80 y.o. Female) Treating RN: Primary Care Physician: Dena Billet Other Clinician: Referring Physician: Dena Billet Treating Physician/Extender: Frann Rider in Treatment: 1 Diagnosis Coding ICD-10 Codes Code Description [id number] Laceration without foreign body of left forearm, initial encounter Facility Procedures CPT4 Code: [id number] Description: [phone] - WOUND CARE VISIT-LEV 2 EST PT Modifier: Quantity: 1 Physician Procedures CPT4 Code Description: [id number] - WC PHYS LEVEL 3 - EST PT ICD-10 Description Diagnosis S51.812A Laceration without foreign body of left forearm, init Modifier: ial encounter Quantity: 1 Electronic Signature(s) Signed: 03/13/2015 11:10:03 AM By: Christin Fudge MD, FACS Previous Signature: 03/13/2015  11:06:06 AM Version By: Christin Fudge MD, FACS Entered By: Christin Fudge on 03/13/2015 11:10:03

## 2015-03-27 ENCOUNTER — Encounter: Payer: Self-pay | Admitting: Physician Assistant

## 2015-03-27 ENCOUNTER — Ambulatory Visit (INDEPENDENT_AMBULATORY_CARE_PROVIDER_SITE_OTHER): Payer: Medicare Other | Admitting: Physician Assistant

## 2015-03-27 VITALS — BP 138/70 | HR 62 | Temp 97.9°F | Resp 16 | Wt 225.0 lb

## 2015-03-27 DIAGNOSIS — I4891 Unspecified atrial fibrillation: Secondary | ICD-10-CM | POA: Diagnosis not present

## 2015-03-27 DIAGNOSIS — E079 Disorder of thyroid, unspecified: Secondary | ICD-10-CM

## 2015-03-27 DIAGNOSIS — E0789 Other specified disorders of thyroid: Secondary | ICD-10-CM | POA: Diagnosis not present

## 2015-03-27 LAB — PT WITH INR/FINGERSTICK
INR FINGERSTICK: 2.3 — AB (ref 0.80–1.20)
PT FINGERSTICK: 27 s — AB (ref 10.4–12.5)

## 2015-03-27 MED ORDER — FUROSEMIDE 20 MG PO TABS: 20.0000 mg | ORAL_TABLET | Freq: Every day | ORAL | Status: DC

## 2015-03-27 NOTE — Progress Notes (Signed)
Patient ID: [name] MRN: [id number], DOB: 03/27/1935, 80 y.o. Date of Encounter: 03/27/2015, 9:22 AM    Chief Complaint:  Chief Complaint  Patient presents with  . Follow-up    4 week on PT/INR     HPI: 80 y.o. year old female states that she is taking her Coumadin as directed. I reviewed the directions that are documented on the medicine list below and she says that this is correct and this is what she is taking. She has had no skips doses. She has had no other medication change. She has had no bleeding.     Home Meds:   Outpatient Prescriptions Prior to Visit  Medication Sig Dispense Refill  . acetaminophen (TYLENOL) 325 MG tablet Take 650 mg by mouth every 6 (six) hours as needed for mild pain.    . Cholecalciferol (VITAMIN D) 2000 UNITS CAPS Take 1 capsule by mouth daily.    . furosemide (LASIX) 20 MG tablet Take 1 tablet (20 mg total) by mouth daily. 90 tablet 1  . losartan (COZAAR) 100 MG tablet Take 1 tablet (100 mg total) by mouth daily. 90 tablet 3  . metoprolol succinate (TOPROL-XL) 50 MG 24 hr tablet Take 1 tablet (50 mg total) by mouth daily. Take with or immediately following a meal. 90 tablet 3  . nitroGLYCERIN (NITROSTAT) 0.4 MG SL tablet Place 1 tablet (0.4 mg total) under the tongue every 5 (five) minutes as needed for chest pain. 25 tablet 1  . omeprazole (PRILOSEC) 20 MG capsule Take 20 mg by mouth daily as needed (heartburn, acid reflux).    Marland Kitchen OVER THE COUNTER MEDICATION Place 1 drop into both eyes daily as needed (dry eyes). Eye drops    . simvastatin (ZOCOR) 10 MG tablet Take 1 tablet (10 mg total) by mouth every other day. 90 tablet 3  . sulfamethoxazole-trimethoprim (BACTRIM DS,SEPTRA DS) 800-160 MG per tablet Take 1 tablet by mouth 2 (two) times daily. 14 tablet 0  . warfarin (COUMADIN) 1 MG tablet Take 1 mg by mouth See admin instructions. Take half a tablet (0.5 mg) with half a tablet of 5 mg (total 3 mg) on all other days (Not Tuesdays, Thursdays, and  Sundays).    . warfarin (COUMADIN) 5 MG tablet Take 0.5 tablets (2.5 mg total) by mouth See admin instructions. Take half a tablet (2.5 mg) on Tuesdays, Thursdays, and Sundays. Take half a tablet with half a tablet of 1 mg (total 3 mg) on all other days. 90 tablet 3   No facility-administered medications prior to visit.    Allergies:  Allergies  Allergen Reactions  . Contrast Media [Iodinated Diagnostic Agents] Other (See Comments)    Per pt strong burning sensation starting in chest radiating outward  . Statins Other (See Comments)    "bones hurt"  . Celebrex [Celecoxib] Rash  . Tape Itching and Rash    Red Where applied and will spread      Review of Systems: See HPI for pertinent ROS. All other ROS negative.    Physical Exam: Blood pressure 138/70, pulse 62, temperature 97.9 F (36.6 C), temperature source Oral, resp. rate 16, weight 225 lb (102.059 kg)., Body mass index is 35.23 kg/(m^2). General:  Obese WF. Appears in no acute distress. Neck: Supple. No thyromegaly. No lymphadenopathy. Lungs: Clear bilaterally to auscultation without wheezes, rales, or rhonchi. Breathing is unlabored. Heart: Irregular rhythm. Msk:  Strength and tone normal for age. Extremities/Skin: Warm and dry.  No edema.  Neuro: Alert and oriented X 3. Moves all extremities spontaneously. Gait is normal. CNII-XII grossly in tact. Psych:  Responds to questions appropriately with a normal affect.     ASSESSMENT AND PLAN:  80 y.o. year old female with  1. Atrial fibrillation, unspecified - PT with INR/Fingerstick; Standing - PT with INR/Fingerstick Results for orders placed or performed in visit on 03/27/15  PT with INR/Fingerstick  Result Value Ref Range   PT, fingerstick 27.0 (H) 10.4 - 12.5 seconds   INR, fingerstick 2.3 (H) 0.80 - 1.20   Continue current Coumadin dosing. Recheck PT/INR 4 weeks. Follow up sooner if needed.   Signed, [address] [place], [place], [facility] 03/27/2015 9:22 AM

## 2015-05-01 ENCOUNTER — Encounter: Payer: Medicare Other | Admitting: Physician Assistant

## 2015-05-19 ENCOUNTER — Encounter: Payer: Self-pay | Admitting: Physician Assistant

## 2015-05-19 ENCOUNTER — Ambulatory Visit (INDEPENDENT_AMBULATORY_CARE_PROVIDER_SITE_OTHER): Payer: Medicare Other | Admitting: Physician Assistant

## 2015-05-19 VITALS — BP 154/84 | HR 60 | Temp 98.4°F | Resp 18 | Ht 65.5 in | Wt 226.0 lb

## 2015-05-19 DIAGNOSIS — Z1239 Encounter for other screening for malignant neoplasm of breast: Secondary | ICD-10-CM

## 2015-05-19 DIAGNOSIS — I10 Essential (primary) hypertension: Secondary | ICD-10-CM | POA: Diagnosis not present

## 2015-05-19 DIAGNOSIS — Z23 Encounter for immunization: Secondary | ICD-10-CM | POA: Diagnosis not present

## 2015-05-19 DIAGNOSIS — Z7901 Long term (current) use of anticoagulants: Secondary | ICD-10-CM | POA: Diagnosis not present

## 2015-05-19 DIAGNOSIS — E785 Hyperlipidemia, unspecified: Secondary | ICD-10-CM

## 2015-05-19 DIAGNOSIS — I482 Chronic atrial fibrillation, unspecified: Secondary | ICD-10-CM

## 2015-05-19 DIAGNOSIS — I270 Primary pulmonary hypertension: Secondary | ICD-10-CM | POA: Diagnosis not present

## 2015-05-19 DIAGNOSIS — J439 Emphysema, unspecified: Secondary | ICD-10-CM | POA: Diagnosis not present

## 2015-05-19 DIAGNOSIS — M8580 Other specified disorders of bone density and structure, unspecified site: Secondary | ICD-10-CM | POA: Diagnosis not present

## 2015-05-19 DIAGNOSIS — I272 Pulmonary hypertension, unspecified: Secondary | ICD-10-CM

## 2015-05-19 LAB — PT WITH INR/FINGERSTICK
INR FINGERSTICK: 2.8 — AB (ref 0.80–1.20)
PT, fingerstick: 33.3 seconds — ABNORMAL HIGH (ref 10.4–12.5)

## 2015-05-19 LAB — COMPLETE METABOLIC PANEL WITH GFR
ALT: 15 U/L (ref 6–29)
AST: 19 U/L (ref 10–35)
Albumin: 3.8 g/dL (ref 3.6–5.1)
Alkaline Phosphatase: 76 U/L (ref 33–130)
BUN: 12 mg/dL (ref 7–25)
CHLORIDE: 103 mmol/L (ref 98–110)
CO2: 27 mmol/L (ref 20–31)
Calcium: 9.6 mg/dL (ref 8.6–10.4)
Creat: 0.7 mg/dL (ref 0.60–0.93)
GFR, Est Non African American: 83 mL/min (ref >=60)
Glucose, Bld: 89 mg/dL (ref 70–99)
Potassium: 4.7 mmol/L (ref 3.5–5.3)
SODIUM: 139 mmol/L (ref 135–146)
Total Bilirubin: 0.7 mg/dL (ref 0.2–1.2)
Total Protein: 7 g/dL (ref 6.1–8.1)

## 2015-05-19 NOTE — Progress Notes (Signed)
Patient ID: [name] MRN: [id number], DOB: 1935-07-08, 80 y.o. Date of Encounter: 05/19/2015,   Chief Complaint: Routine follow-up office visit. So check PT/INR.  HPI: 80 y.o. y/o female  here for routine checkup.  She and her husband are both actually scheduled for visits today and they are both down for "complete physical exams". Again today they report that their insurance company keeps calling,  telling them they need to come in for a complete physical checkup. This has happened in the past as well as again today where they have come in for complete physical exam and it is less than 12 months between visits. Today 05/19/15 I discussed this with patient and her husband and told them that last CPE was 01/29/15 and it has to be greater than 12 months between CPEs. Told them that today's visit will be a routine follow-up visit.    She sees the following medical providers on a routine basis:  [facility]--- she goes there every 6 months.  Dr. Cristina Gong for GI--says she is to followup with him in 5 years  FYI: At her physical 03/2014 she reported that she also sawDr. Chancy Milroy at the [facility]--- she was going there every 6 months----today-01/2015--she states that they told her that she did not need to continue to follow-up there.   She states that she has been seeing Dr. Cristina Gong recently because she was having diarrhea and a lot of gas etc. She had a colonoscopy with him  Shammas 2016.   She is taking blood pressure medications as directed with no adverse effects.  She is taking cholesterol medications as directed with no myalgias or other adverse effects.  She is taking her Coumadin as directed. No skipped doses. No significant change in diet. Has seen no bleeding from the gums or other bleeding.  Review of Systems: Consitutional: No fever, chills, fatigue, night sweats, lymphadenopathy. No significant/unexplained weight changes. Eyes: No visual  changes, eye redness, or discharge. ENT/Mouth: No ear pain, sore throat, nasal drainage, or sinus pain. Cardiovascular: No chest pressure,heaviness, tightness or squeezing, even with exertion. No increased shortness of breath or dyspnea on exertion.No palpitations, edema, orthopnea, PND. Respiratory: No cough, hemoptysis, SOB, or wheezing. Gastrointestinal: No anorexia, dysphagia, reflux, pain, nausea, vomiting, hematemesis, diarrhea, constipation, BRBPR, or melena. Breast: No mass, nodules, bulging, or retraction. No skin changes or inflammation. No nipple discharge. No lymphadenopathy. Genitourinary: No dysuria, hematuria, incontinence, vaginal discharge, pruritis, burning, abnormal bleeding, or pain. Musculoskeletal: No decreased ROM, No joint pain or swelling. No significant pain in neck, back, or extremities. Skin: No rash, pruritis, or concerning lesions. Neurological: No headache, dizziness, syncope, seizures, tremors, memory loss, coordination problems, or paresthesias. Psychological: No anxiety, depression, hallucinations, SI/HI. Endocrine: No polydipsia, polyphagia, polyuria, or known diabetes.No increased fatigue. No palpitations/rapid heart rate. No significant/unexplained weight change. All other systems were reviewed and are otherwise negative.  Past Medical History  Diagnosis Date  . Cancer     right colon and left breast  . Breast cancer 01/06/2012  . Colon cancer 01/06/2012  . Hernia   . GERD (gastroesophageal reflux disease)   . Hypertension   . Allergy     rhinitis  . Osteopenia   . Diverticulosis   . Pneumonia     hx child  . Hiatal hernia     denies  . Arthritis   . Coronary artery disease 2006    nonobstructive with 20-30% ostial LAD and LM  .  Atrial fibrillation     chronic atrial fibrillation  . Edema extremities   . Hyperlipidemia   . COPD (chronic obstructive pulmonary disease)   . Dilated aortic root   . Pulmonary HTN     moderate PASP 9mmHg      Past Surgical History  Procedure Laterality Date  . Colectomy      right side  . Mastectomy partial / lumpectomy  2008    left breast  . Cardiac catheterization    . Breast surgery      lumpectomy left  . Eye surgery Bilateral 12    cataracts  . Appendectomy    . Cholecystectomy    . Excision of accessory nipple Bilateral 05/30/2013    Procedure: BILATERAL NIPPLE BIOPSY;  Surgeon: Merrie Roof, MD;  Location: [facility];  Service: General;  Laterality: Bilateral;    Home Meds:  Outpatient Prescriptions Prior to Visit  Medication Sig Dispense Refill  . acetaminophen (TYLENOL) 325 MG tablet Take 650 mg by mouth every 6 (six) hours as needed for mild pain.    . Cholecalciferol (VITAMIN D) 2000 UNITS CAPS Take 1 capsule by mouth daily.    . furosemide (LASIX) 20 MG tablet Take 1 tablet (20 mg total) by mouth daily. 90 tablet 1  . losartan (COZAAR) 100 MG tablet Take 1 tablet (100 mg total) by mouth daily. 90 tablet 3  . metoprolol succinate (TOPROL-XL) 50 MG 24 hr tablet Take 1 tablet (50 mg total) by mouth daily. Take with or immediately following a meal. 90 tablet 3  . nitroGLYCERIN (NITROSTAT) 0.4 MG SL tablet Place 1 tablet (0.4 mg total) under the tongue every 5 (five) minutes as needed for chest pain. 25 tablet 1  . omeprazole (PRILOSEC) 20 MG capsule Take 20 mg by mouth daily as needed (heartburn, acid reflux).    Marland Kitchen OVER THE COUNTER MEDICATION Place 1 drop into both eyes daily as needed (dry eyes). Eye drops    . simvastatin (ZOCOR) 10 MG tablet Take 1 tablet (10 mg total) by mouth every other day. 90 tablet 3  . warfarin (COUMADIN) 1 MG tablet Take 1 mg by mouth See admin instructions. Take half a tablet (0.5 mg) with half a tablet of 5 mg (total 3 mg) on all other days (Not Tuesdays, Thursdays, and Sundays).    . warfarin (COUMADIN) 5 MG tablet Take 0.5 tablets (2.5 mg total) by mouth See admin instructions. Take half a tablet (2.5 mg) on Tuesdays, Thursdays, and Sundays. Take  half a tablet with half a tablet of 1 mg (total 3 mg) on all other days. 90 tablet 3  . sulfamethoxazole-trimethoprim (BACTRIM DS,SEPTRA DS) 800-160 MG per tablet Take 1 tablet by mouth 2 (two) times daily. 14 tablet 0   No facility-administered medications prior to visit.    Allergies:  Allergies  Allergen Reactions  . Contrast Media [Iodinated Diagnostic Agents] Other (See Comments)    Per pt strong burning sensation starting in chest radiating outward  . Statins Other (See Comments)    "bones hurt"  . Celebrex [Celecoxib] Rash  . Tape Itching and Rash    Red Where applied and will spread    Social History   Social History  . Marital Status: Married    Spouse Name: N/A  . Number of Children: N/A  . Years of Education: N/A   Occupational History  . Not on file.   Social History Main Topics  . Smoking status: Never Smoker   .  Smokeless tobacco: Never Used  . Alcohol Use: No  . Drug Use: No  . Sexual Activity: Not Currently   Other Topics Concern  . Not on file   Social History Narrative    Family History  Problem Relation Age of Onset  . Heart disease Mother   . Cancer Sister     stomach and colon  . Heart disease Brother 45  . Cancer Sister     Physical Exam: Blood pressure 154/84, pulse 60, temperature 98.4 F (36.9 C), temperature source Oral, resp. rate 18, height 5' 5.5" (1.664 m), weight 226 lb (102.513 kg)., Body mass index is 37.02 kg/(m^2). General: Obese WF. Appears in no acute distress. Neck: Supple. Trachea midline. No thyromegaly. Full ROM. No lymphadenopathy.No Carotid Bruits. Lungs: Clear to auscultation bilaterally without wheezes, rales, or rhonchi. Breathing is of normal effort and unlabored. Cardiovascular: Irregular rhythm. No murmur. . Distal pulses intact. No carotid or abdominal bruits. Abdomen: Soft, non-tender, non-distended with normoactive bowel sounds. No hepatosplenomegaly or masses. No rebound/guarding. No CVA tenderness. No  hernias.  Musculoskeletal: Full range of motion and 5/5 strength throughout.  Skin: Warm and moist. No LE edema. Neuro: A+Ox3. CN II-XII grossly intact. Moves all extremities spontaneously. Full sensation throughout. Normal gait.  Psych:  Responds to questions appropriately with a normal affect.   Assessment/Plan:  80 y.o. y/o female here for    Atrial fibrillation - PT with INR/Fingerstick INR therapeutic. Continue current dose. Recheck 4 weeks.  2. Long term current use of anticoagulant therapy - PT with INR/Fingerstick  3. Essential hypertension Blood pressure slightly high today but has been well controlled at prior visits so will monitor and make no changes now. Recheck CMET today to monitor for adverse medication effects.  4. Osteopenia She had DEXA  01/09/2007--Showed Osteoporosis. It was ordered by Dr. Sheela Stack by them. Pt says she was on Fosamax--no longer on this now. I assume that she completed 5 year course of treatment.    5. Hyperlipidemia She is on simvastatin. She had FLP 12/24/14 which was excellent so can wait to repeat this. Has not had LFT checks since 10/2014 supple repeat this with CME T today.  6. Chronic atrial fibrillation INR checked today and therapeutic. Continue current dose and recheck 4 weeks. Other medications for her A. fib are managed by Dr. [name] cardiologist.  7. Chronic anticoagulation INR therapeutic. Continue current dose and recheck 4 weeks.  8. Pulmonary emphysema, unspecified emphysema type This is asymptomatic. Has required no medication.  9. Pulmonary HTN Managed by cardiology Dr. [name].   Visit for preventive health examination----last CPE was 01/29/15. Exlained to patient that we have to wait full year between CPEs. The Following is copied from CPE 01/29/15:  A. Screening Labs: BMET---01/27/2015 FLP---12/24/2014 FLP/LFT --10/29/13 CBC -- 08/08/2014   B. Pap: Not indicated as she is now age 80  C. Screening Mammogram:  At  visit 01/2015 she states that her last mammogram was 2 years ago. She is agreeable to call and schedule follow-up mammogram herself. Also wrote this down on her AVS to remind her to do this. At [place] 05/19/15 patient says that she forgot to schedule mammogram. She is agreeable for me to go ahead and place order for this. Her placed at visit 05/19/15.  D. DEXA/BMD: She had this done 01/09/2007--Showed Osteoporosis. It was ordered by Dr. Sheela Stack by them. Pt says she was on Fosamax--no longer on this now. I assume that she completed the 5 year course of therapy for  this.  E. Colorectal Cancer Screening:  At visit 01/29/15 she states that she just had a colonoscopy this month with Dr. Cristina Gong. States that she has follow-up visit with him tomorrow.  F. Immunizations:  Influenza:  ----------------------- given here 05/19/15. Tetanus:  ------------------------04/06/2010 Pneumococcal:      Pneumovax 23------------ 07/15/2005      Prevnar 13------------------ 03/14/2014       Zostavax:--At visit 05/19/15 patient states that she did check with insurance and Zostavax is gone a cough and $80 that she defers this.   Marin Olp [place], [place], [facility] 05/19/2015 9:48 AM

## 2015-05-22 ENCOUNTER — Ambulatory Visit (INDEPENDENT_AMBULATORY_CARE_PROVIDER_SITE_OTHER): Payer: Medicare Other | Admitting: Physician Assistant

## 2015-05-22 ENCOUNTER — Encounter: Payer: Self-pay | Admitting: Physician Assistant

## 2015-05-22 VITALS — BP 150/80 | HR 60 | Temp 98.3°F | Resp 18 | Wt 226.0 lb

## 2015-05-22 DIAGNOSIS — L200 Besnier's prurigo: Secondary | ICD-10-CM | POA: Diagnosis not present

## 2015-05-22 DIAGNOSIS — L239 Allergic contact dermatitis, unspecified cause: Secondary | ICD-10-CM

## 2015-05-22 NOTE — Progress Notes (Signed)
Patient ID: [name] MRN: [id number], DOB: [birth year], 80 y.o. Date of Encounter: 05/22/2015, 4:00 PM    Chief Complaint:  Chief Complaint  Patient presents with  . bite on rt side neck     HPI: 80 y.o. year old white female says that night before last she felt a sting on the right side of her neck. Says that she swatted at it but never saw what it was. Says that she took one Benadryl that night and has taken 1 Benadryl per day since. Says that her son told her she should come have it checked. Has noticed some rash at the site. Has had no other symptoms. No difficulty breathing. No swelling or tightness of the throat.     Home Meds:   Outpatient Prescriptions Prior to Visit  Medication Sig Dispense Refill  . acetaminophen (TYLENOL) 325 MG tablet Take 650 mg by mouth every 6 (six) hours as needed for mild pain.    . Cholecalciferol (VITAMIN D) 2000 UNITS CAPS Take 1 capsule by mouth daily.    . furosemide (LASIX) 20 MG tablet Take 1 tablet (20 mg total) by mouth daily. 90 tablet 1  . losartan (COZAAR) 100 MG tablet Take 1 tablet (100 mg total) by mouth daily. 90 tablet 3  . metoprolol succinate (TOPROL-XL) 50 MG 24 hr tablet Take 1 tablet (50 mg total) by mouth daily. Take with or immediately following a meal. 90 tablet 3  . nitroGLYCERIN (NITROSTAT) 0.4 MG SL tablet Place 1 tablet (0.4 mg total) under the tongue every 5 (five) minutes as needed for chest pain. 25 tablet 1  . omeprazole (PRILOSEC) 20 MG capsule Take 20 mg by mouth daily as needed (heartburn, acid reflux).    Marland Kitchen OVER THE COUNTER MEDICATION Place 1 drop into both eyes daily as needed (dry eyes). Eye drops    . Probiotic Product (ALIGN) 4 MG CAPS Take 1 capsule by mouth daily.    . simvastatin (ZOCOR) 10 MG tablet Take 1 tablet (10 mg total) by mouth every other day. 90 tablet 3  . triamcinolone ointment (KENALOG) 0.1 %   0  . warfarin (COUMADIN) 1 MG tablet Take 1 mg by mouth See admin instructions. Take half a  tablet (0.5 mg) with half a tablet of 5 mg (total 3 mg) on all other days (Not Tuesdays, Thursdays, and Sundays).    . warfarin (COUMADIN) 5 MG tablet Take 0.5 tablets (2.5 mg total) by mouth See admin instructions. Take half a tablet (2.5 mg) on Tuesdays, Thursdays, and Sundays. Take half a tablet with half a tablet of 1 mg (total 3 mg) on all other days. 90 tablet 3   No facility-administered medications prior to visit.    Allergies:  Allergies  Allergen Reactions  . Contrast Media [Iodinated Diagnostic Agents] Other (See Comments)    Per pt strong burning sensation starting in chest radiating outward  . Statins Other (See Comments)    "bones hurt"  . Celebrex [Celecoxib] Rash  . Tape Itching and Rash    Red Where applied and will spread      Review of Systems: See HPI for pertinent ROS. All other ROS negative.    Physical Exam: Blood pressure 150/80, pulse 60, temperature 98.3 F (36.8 C), temperature source Oral, resp. rate 18, weight 226 lb (102.513 kg)., Body mass index is 37.02 kg/(m^2). General:  Obese white female . Appears in no acute distress. Neck: Supple. No thyromegaly. No lymphadenopathy. Lungs: Clear  bilaterally to auscultation without wheezes, rales, or rhonchi. Breathing is unlabored. Heart: Regular rhythm. No murmurs, rubs, or gallops. Msk:  Strength and tone normal for age. Skin:  On the right side of the neck there is a 1-1/2 inch diameter circular area that is very light pink very minimal urticaria. No other rashes. Neuro: Alert and oriented X 3. Moves all extremities spontaneously. Gait is normal. CNII-XII grossly in tact. Psych:  Responds to questions appropriately with a normal affect.     ASSESSMENT AND PLAN:  80 y.o. year old female with  1. Allergic dermatitis Told her to take Benadryl at maximum dose on a routine basis. Told her to also take Pepcid. I do not feel that she needs any additional medication/treatment at this time. Told her that if it  seems like it is any worse then she can call/follow-up.   Marin Olp [place], [place], BSFM 05/22/2015 4:00 PM

## 2015-05-23 ENCOUNTER — Other Ambulatory Visit: Payer: Self-pay | Admitting: Family Medicine

## 2015-05-23 NOTE — Telephone Encounter (Signed)
Medication refilled per protocol. 

## 2015-05-26 ENCOUNTER — Encounter: Payer: Self-pay | Admitting: *Deleted

## 2015-05-27 ENCOUNTER — Ambulatory Visit: Payer: Medicare Other | Admitting: Family Medicine

## 2015-05-27 ENCOUNTER — Ambulatory Visit (INDEPENDENT_AMBULATORY_CARE_PROVIDER_SITE_OTHER): Payer: Medicare Other | Admitting: Family Medicine

## 2015-05-27 ENCOUNTER — Encounter: Payer: Self-pay | Admitting: Family Medicine

## 2015-05-27 VITALS — BP 136/74 | HR 68 | Temp 98.5°F | Resp 14 | Ht 67.0 in | Wt 226.0 lb

## 2015-05-27 DIAGNOSIS — T7840XD Allergy, unspecified, subsequent encounter: Secondary | ICD-10-CM

## 2015-05-27 MED ORDER — PREDNISONE 20 MG PO TABS: ORAL_TABLET | ORAL | Status: DC

## 2015-05-27 NOTE — Progress Notes (Signed)
Subjective:    Patient ID: [name], female    DOB: 1935-03-11, 80 y.o.   MRN: [id number]  HPI  Please see the patient's last office visit with [facility].  Patient suffered some sort of insect staying on the left side of her neck and developed an urticarial rash. It is recommended that she take Benadryl. Patient has been doing this however the rash is spreading and is worsening. She has a diffuse urticarial rash extending from the angle of the mandible down the anterior right side of her neck over her collarbone and onto her right side of her chest. It is intensely pruritic. She's been taking Benadryl and calamine lotion without benefit. Past Medical History  Diagnosis Date  . Cancer     right colon and left breast  . Breast cancer 01/06/2012  . Colon cancer 01/06/2012  . Hernia   . GERD (gastroesophageal reflux disease)   . Hypertension   . Allergy     rhinitis  . Osteopenia   . Diverticulosis   . Pneumonia     hx child  . Hiatal hernia     denies  . Arthritis   . Coronary artery disease 2006    nonobstructive with 20-30% ostial LAD and LM  . Atrial fibrillation     chronic atrial fibrillation  . Edema extremities   . Hyperlipidemia   . COPD (chronic obstructive pulmonary disease)   . Dilated aortic root   . Pulmonary HTN     moderate PASP 86mmHg   Past Surgical History  Procedure Laterality Date  . Colectomy      right side  . Mastectomy partial / lumpectomy  2008    left breast  . Cardiac catheterization    . Breast surgery      lumpectomy left  . Eye surgery Bilateral 12    cataracts  . Appendectomy    . Cholecystectomy    . Excision of accessory nipple Bilateral 05/30/2013    Procedure: BILATERAL NIPPLE BIOPSY;  Surgeon: Merrie Roof, MD;  Location: [place];  Service: General;  Laterality: Bilateral;   Current Outpatient Prescriptions on File Prior to Visit  Medication Sig Dispense Refill  . acetaminophen (TYLENOL) 325 MG tablet Take 650 mg by mouth  every 6 (six) hours as needed for mild pain.    . Cholecalciferol (VITAMIN D) 2000 UNITS CAPS Take 1 capsule by mouth daily.    . furosemide (LASIX) 20 MG tablet Take 1 tablet (20 mg total) by mouth daily. 90 tablet 1  . losartan (COZAAR) 100 MG tablet Take 1 tablet (100 mg total) by mouth daily. 90 tablet 3  . metoprolol succinate (TOPROL-XL) 50 MG 24 hr tablet Take 1 tablet (50 mg total) by mouth daily. Take with or immediately following a meal. 90 tablet 3  . nitroGLYCERIN (NITROSTAT) 0.4 MG SL tablet Place 1 tablet (0.4 mg total) under the tongue every 5 (five) minutes as needed for chest pain. 25 tablet 1  . omeprazole (PRILOSEC) 20 MG capsule Take 20 mg by mouth daily as needed (heartburn, acid reflux).    Marland Kitchen OVER THE COUNTER MEDICATION Place 1 drop into both eyes daily as needed (dry eyes). Eye drops    . Probiotic Product (ALIGN) 4 MG CAPS Take 1 capsule by mouth daily.    . simvastatin (ZOCOR) 10 MG tablet Take 1 tablet (10 mg total) by mouth every other day. 90 tablet 3  . triamcinolone ointment (KENALOG) 0.1 %  0  . warfarin (COUMADIN) 1 MG tablet Take 1 mg by mouth See admin instructions. Take half a tablet (0.5 mg) with half a tablet of 5 mg (total 3 mg) on all other days (Not Tuesdays, Thursdays, and Sundays).    . warfarin (COUMADIN) 1 MG tablet TAKE AS DIRECTED 30 tablet 5  . warfarin (COUMADIN) 5 MG tablet Take 0.5 tablets (2.5 mg total) by mouth See admin instructions. Take half a tablet (2.5 mg) on Tuesdays, Thursdays, and Sundays. Take half a tablet with half a tablet of 1 mg (total 3 mg) on all other days. 90 tablet 3   No current facility-administered medications on file prior to visit.   Allergies  Allergen Reactions  . Contrast Media [Iodinated Diagnostic Agents] Other (See Comments)    Per pt strong burning sensation starting in chest radiating outward  . Statins Other (See Comments)    "bones hurt"  . Celebrex [Celecoxib] Rash  . Tape Itching and Rash    Red Where  applied and will spread   Social History   Social History  . Marital Status: Married    Spouse Name: N/A  . Number of Children: N/A  . Years of Education: N/A   Occupational History  . Not on file.   Social History Main Topics  . Smoking status: Never Smoker   . Smokeless tobacco: Never Used  . Alcohol Use: No  . Drug Use: No  . Sexual Activity: Not Currently   Other Topics Concern  . Not on file   Social History Narrative     Review of Systems  All other systems reviewed and are negative.      Objective:   Physical Exam  Neck: Neck supple.  Cardiovascular: Normal rate and normal heart sounds.   Pulmonary/Chest: Effort normal and breath sounds normal. No stridor.  Lymphadenopathy:    She has no cervical adenopathy.  Skin: Rash noted. There is erythema.  Vitals reviewed.         Assessment & Plan:  Allergic reaction, subsequent encounter - Plan: predniSONE (DELTASONE) 20 MG tablet  Begin prednisone taper pack. Continue to use Benadryl 25 mg every 6 hours. Recheck Coumadin in one week. Return immediately if rash worsens.

## 2015-05-28 LAB — HM MAMMOGRAPHY

## 2015-05-30 ENCOUNTER — Encounter: Payer: Self-pay | Admitting: *Deleted

## 2015-06-26 ENCOUNTER — Encounter: Payer: Self-pay | Admitting: Physician Assistant

## 2015-07-22 ENCOUNTER — Ambulatory Visit (INDEPENDENT_AMBULATORY_CARE_PROVIDER_SITE_OTHER): Payer: Medicare Other | Admitting: Family Medicine

## 2015-07-22 ENCOUNTER — Encounter: Payer: Self-pay | Admitting: Family Medicine

## 2015-07-22 VITALS — BP 138/74 | HR 64 | Temp 98.4°F | Resp 16 | Wt 229.0 lb

## 2015-07-22 DIAGNOSIS — I4891 Unspecified atrial fibrillation: Secondary | ICD-10-CM

## 2015-07-22 DIAGNOSIS — J069 Acute upper respiratory infection, unspecified: Secondary | ICD-10-CM

## 2015-07-22 DIAGNOSIS — K219 Gastro-esophageal reflux disease without esophagitis: Secondary | ICD-10-CM | POA: Diagnosis not present

## 2015-07-22 LAB — PT WITH INR/FINGERSTICK
INR, fingerstick: 2.3 — ABNORMAL HIGH (ref 0.80–1.20)
PT FINGERSTICK: 28.1 s — AB (ref 10.4–12.5)

## 2015-07-22 MED ORDER — GUAIFENESIN-CODEINE 100-10 MG/5ML PO SOLN: 5.0000 mL | Freq: Four times a day (QID) | ORAL | Status: DC | PRN

## 2015-07-22 NOTE — Patient Instructions (Addendum)
Take the prilosec every day, 30 minutes before breakfast Take cough syrup Call if anything worsens or you get fever Coumadin dose no changes F/U6 weeks for coumadin check

## 2015-07-22 NOTE — Assessment & Plan Note (Signed)
Advised to take prilosec daily for epigastric discomfort, no red flags one exam

## 2015-07-22 NOTE — Progress Notes (Signed)
Patient ID: [name], female   DOB: 05-13-1935, 80 y.o.   MRN: [id number]   Subjective:    Patient ID: [name], female    DOB: [birth year], 80 y.o.   MRN: [id number]  Patient presents for PT/INR and Illness  issue here was 3-4 days of nonproductive cough worse at nighttime. She took some Mucinex one day with minimal improvement. She states her husband has been Best boy but they did not help. She's not had any fever she does get short of breath when she has coughing spells which are worse at night. She denies any sinus drainage or sore throat no ear pain. Her other concern is epigastric discomfort. She states it just feels a little sore there is no pain with eating no change in her bowel movement she has some irritable bowels and takes align on a regular basis. She's not been taking her Prilosec daily which she does have acid reflux as well as hiatal hernia.  History age of her ablation she is on Coumadin she takes 3 mg daily except for Sun, T, Th- 2.5mg      Review Of Systems:  GEN- denies fatigue, fever, weight loss,weakness, recent illness HEENT- denies eye drainage, change in vision, nasal discharge, CVS- denies chest pain, palpitations RESP- +SOB, +cough, wheeze ABD- denies N/V, change in stools, +abd pain GU- denies dysuria, hematuria, dribbling, incontinence MSK- denies joint pain, muscle aches, injury Neuro- denies headache, dizziness, syncope, seizure activity       Objective:    BP 138/74 mmHg  Pulse 64  Temp(Src) 98.4 F (36.9 C) (Oral)  Resp 16  Wt 229 lb (103.874 kg)  SpO2 96% GEN- NAD, alert and oriented x3, not ill appearing  HEENT- PERRL, EOMI, non injected sclera, pink conjunctiva, MMM, oropharynx clear, TM clear no effusion, canals clear, no sinus tendernes, nares clear  Neck- Supple, no LAD CVS- RRR, no murmur RESP-CTAB ABD-NABS,soft,mild TTP epigastric region ,ND EXT-pedal edema Pulses- Radial  2+        Assessment & Plan:      Problem List  Items Addressed This Visit    GERD (gastroesophageal reflux disease)    Advised to take prilosec daily for epigastric discomfort, no red flags one exam       Other Visit Diagnoses    Viral URI    -  Primary    Early URI, no fever, benign exam, robitussin AC, humidifer, if she decompensates add antibiotics    Atrial fibrillation, unspecified        INR 2.3 at goal, continue current dose       Note: This dictation was prepared with Dragon dictation along with smaller phrase technology. Any transcriptional errors that result from this process are unintentional.

## 2015-07-24 ENCOUNTER — Telehealth: Payer: Self-pay | Admitting: Physician Assistant

## 2015-07-24 MED ORDER — AZITHROMYCIN 250 MG PO TABS: ORAL_TABLET | ORAL | Status: DC

## 2015-07-24 NOTE — Telephone Encounter (Signed)
Pt is calling to see if we can call her in a prescription because she is not feeling any better. Pt ph: [phone] Pharmarcy: [facility]

## 2015-07-24 NOTE — Telephone Encounter (Signed)
Left patient message that Rx has been sent to pharmacy.

## 2015-07-24 NOTE — Telephone Encounter (Signed)
Reviewed recent office visit note with Dr. Buelah Manis. At that time patient was having cough. Can add antibiotic: Azithromycin 250 mg Day 1: Take 2 daily Days 2 through 5: Take 1 daily Dispense #6 (one pack) +0

## 2015-07-28 ENCOUNTER — Emergency Department (HOSPITAL_COMMUNITY): Payer: Medicare Other

## 2015-07-28 ENCOUNTER — Encounter (HOSPITAL_COMMUNITY)
Admission: EM | Disposition: A | Discharge status: Home / Self Care | Payer: Self-pay | Source: Home / Self Care | Attending: Cardiovascular Disease

## 2015-07-28 ENCOUNTER — Encounter (HOSPITAL_COMMUNITY): Payer: Self-pay

## 2015-07-28 ENCOUNTER — Inpatient Hospital Stay (HOSPITAL_COMMUNITY)
Admission: EM | Admit: 2015-07-28 | Discharge: 2015-07-30 | DRG: 281 | Disposition: A | Discharge status: Home / Self Care | Payer: Medicare Other | Attending: Cardiovascular Disease | Admitting: Cardiovascular Disease

## 2015-07-28 DIAGNOSIS — Z9012 Acquired absence of left breast and nipple: Secondary | ICD-10-CM

## 2015-07-28 DIAGNOSIS — R0602 Shortness of breath: Secondary | ICD-10-CM

## 2015-07-28 DIAGNOSIS — I214 Non-ST elevation (NSTEMI) myocardial infarction: Principal | ICD-10-CM

## 2015-07-28 DIAGNOSIS — J309 Allergic rhinitis, unspecified: Secondary | ICD-10-CM | POA: Diagnosis present

## 2015-07-28 DIAGNOSIS — K219 Gastro-esophageal reflux disease without esophagitis: Secondary | ICD-10-CM | POA: Diagnosis present

## 2015-07-28 DIAGNOSIS — I712 Thoracic aortic aneurysm, without rupture: Secondary | ICD-10-CM | POA: Diagnosis present

## 2015-07-28 DIAGNOSIS — Z9049 Acquired absence of other specified parts of digestive tract: Secondary | ICD-10-CM | POA: Diagnosis not present

## 2015-07-28 DIAGNOSIS — I10 Essential (primary) hypertension: Secondary | ICD-10-CM | POA: Diagnosis present

## 2015-07-28 DIAGNOSIS — I482 Chronic atrial fibrillation: Secondary | ICD-10-CM | POA: Diagnosis present

## 2015-07-28 DIAGNOSIS — Z853 Personal history of malignant neoplasm of breast: Secondary | ICD-10-CM | POA: Diagnosis not present

## 2015-07-28 DIAGNOSIS — J449 Chronic obstructive pulmonary disease, unspecified: Secondary | ICD-10-CM | POA: Diagnosis present

## 2015-07-28 DIAGNOSIS — I5032 Chronic diastolic (congestive) heart failure: Secondary | ICD-10-CM | POA: Diagnosis present

## 2015-07-28 DIAGNOSIS — I5181 Takotsubo syndrome: Secondary | ICD-10-CM | POA: Diagnosis not present

## 2015-07-28 DIAGNOSIS — Z91041 Radiographic dye allergy status: Secondary | ICD-10-CM

## 2015-07-28 DIAGNOSIS — Z800 Family history of malignant neoplasm of digestive organs: Secondary | ICD-10-CM

## 2015-07-28 DIAGNOSIS — I4589 Other specified conduction disorders: Secondary | ICD-10-CM | POA: Diagnosis present

## 2015-07-28 DIAGNOSIS — I272 Other secondary pulmonary hypertension: Secondary | ICD-10-CM | POA: Diagnosis present

## 2015-07-28 DIAGNOSIS — I110 Hypertensive heart disease with heart failure: Secondary | ICD-10-CM | POA: Diagnosis present

## 2015-07-28 DIAGNOSIS — Z85038 Personal history of other malignant neoplasm of large intestine: Secondary | ICD-10-CM

## 2015-07-28 DIAGNOSIS — R001 Bradycardia, unspecified: Secondary | ICD-10-CM | POA: Diagnosis not present

## 2015-07-28 DIAGNOSIS — Z7902 Long term (current) use of antithrombotics/antiplatelets: Secondary | ICD-10-CM | POA: Diagnosis not present

## 2015-07-28 DIAGNOSIS — Z8249 Family history of ischemic heart disease and other diseases of the circulatory system: Secondary | ICD-10-CM | POA: Diagnosis not present

## 2015-07-28 DIAGNOSIS — I4821 Permanent atrial fibrillation: Secondary | ICD-10-CM | POA: Diagnosis present

## 2015-07-28 DIAGNOSIS — I2510 Atherosclerotic heart disease of native coronary artery without angina pectoris: Secondary | ICD-10-CM | POA: Diagnosis present

## 2015-07-28 DIAGNOSIS — E785 Hyperlipidemia, unspecified: Secondary | ICD-10-CM | POA: Diagnosis present

## 2015-07-28 DIAGNOSIS — Z888 Allergy status to other drugs, medicaments and biological substances status: Secondary | ICD-10-CM

## 2015-07-28 DIAGNOSIS — I425 Other restrictive cardiomyopathy: Secondary | ICD-10-CM | POA: Diagnosis not present

## 2015-07-28 HISTORY — DX: Takotsubo syndrome: I51.81

## 2015-07-28 HISTORY — PX: CARDIAC CATHETERIZATION: SHX172

## 2015-07-28 LAB — CBC
HCT: 37.9 % (ref 36.0–46.0)
HEMOGLOBIN: 11.8 g/dL — AB (ref 12.0–15.0)
MCH: 29.1 pg (ref 26.0–34.0)
MCHC: 31.1 g/dL (ref 30.0–36.0)
MCV: 93.6 fL (ref 78.0–100.0)
Platelets: 205 10*3/uL (ref 150–400)
RBC: 4.05 MIL/uL (ref 3.87–5.11)
RDW: 14.3 % (ref 11.5–15.5)
WBC: 8.3 10*3/uL (ref 4.0–10.5)

## 2015-07-28 LAB — I-STAT TROPONIN, ED
TROPONIN I, POC: 0.06 ng/mL (ref 0.00–0.08)
TROPONIN I, POC: 0.75 ng/mL — AB (ref 0.00–0.08)

## 2015-07-28 LAB — BASIC METABOLIC PANEL
ANION GAP: 8 (ref 5–15)
BUN: 15 mg/dL (ref 6–20)
CALCIUM: 9.6 mg/dL (ref 8.9–10.3)
CO2: 28 mmol/L (ref 22–32)
Chloride: 102 mmol/L (ref 101–111)
Creatinine, Ser: 0.72 mg/dL (ref 0.44–1.00)
GLUCOSE: 149 mg/dL — AB (ref 65–99)
Potassium: 4.1 mmol/L (ref 3.5–5.1)
Sodium: 138 mmol/L (ref 135–145)

## 2015-07-28 LAB — HEPATIC FUNCTION PANEL
ALBUMIN: 3.7 g/dL (ref 3.5–5.0)
ALK PHOS: 72 U/L (ref 38–126)
ALT: 20 U/L (ref 14–54)
AST: 25 U/L (ref 15–41)
BILIRUBIN TOTAL: 0.5 mg/dL (ref 0.3–1.2)
Bilirubin, Direct: 0.2 mg/dL (ref 0.1–0.5)
Indirect Bilirubin: 0.3 mg/dL (ref 0.3–0.9)
Total Protein: 7.8 g/dL (ref 6.5–8.1)

## 2015-07-28 LAB — MRSA PCR SCREENING: MRSA BY PCR: NEGATIVE

## 2015-07-28 LAB — PROTIME-INR
INR: 1.68 — ABNORMAL HIGH (ref 0.00–1.49)
Prothrombin Time: 19.8 seconds — ABNORMAL HIGH (ref 11.6–15.2)

## 2015-07-28 LAB — LIPASE, BLOOD: LIPASE: 27 U/L (ref 11–51)

## 2015-07-28 LAB — TROPONIN I
TROPONIN I: 1.54 ng/mL — AB (ref <0.031)
TROPONIN I: 1.55 ng/mL — AB (ref <0.031)

## 2015-07-28 SURGERY — LEFT HEART CATH AND CORONARY ANGIOGRAPHY

## 2015-07-28 MED ORDER — WARFARIN - PHARMACIST DOSING INPATIENT
Freq: Every day | Status: DC
  Administered 2015-07-28: 18:00:00

## 2015-07-28 MED ORDER — WARFARIN SODIUM 4 MG PO TABS
4.0000 mg | ORAL_TABLET | Freq: Once | ORAL | Status: AC
Start: 2015-07-28 — End: 2015-07-28
  Administered 2015-07-28: 4 mg via ORAL
  Filled 2015-07-28: qty 1

## 2015-07-28 MED ORDER — SODIUM CHLORIDE 0.9 % IV SOLN: 250.0000 mL | INTRAVENOUS | Status: DC | PRN

## 2015-07-28 MED ORDER — NITROGLYCERIN IN D5W 200-5 MCG/ML-% IV SOLN
3.0000 ug/min | INTRAVENOUS | Status: DC
  Administered 2015-07-28: 25 ug/min via INTRAVENOUS

## 2015-07-28 MED ORDER — MIDAZOLAM HCL 2 MG/2ML IJ SOLN
INTRAMUSCULAR | Status: AC
  Filled 2015-07-28: qty 2

## 2015-07-28 MED ORDER — FUROSEMIDE 20 MG PO TABS
20.0000 mg | ORAL_TABLET | Freq: Every day | ORAL | Status: DC
  Administered 2015-07-28 – 2015-07-30 (×3): 20 mg via ORAL
  Filled 2015-07-28 (×3): qty 1

## 2015-07-28 MED ORDER — LIDOCAINE HCL (PF) 1 % IJ SOLN
INTRAMUSCULAR | Status: AC
  Filled 2015-07-28: qty 30

## 2015-07-28 MED ORDER — FENTANYL CITRATE (PF) 100 MCG/2ML IJ SOLN
INTRAMUSCULAR | Status: DC | PRN
  Administered 2015-07-28: 25 ug via INTRAVENOUS

## 2015-07-28 MED ORDER — ONDANSETRON HCL 4 MG/2ML IJ SOLN: 4.0000 mg | Freq: Four times a day (QID) | INTRAMUSCULAR | Status: DC | PRN

## 2015-07-28 MED ORDER — SODIUM CHLORIDE 0.9 % IJ SOLN: 3.0000 mL | INTRAMUSCULAR | Status: DC | PRN

## 2015-07-28 MED ORDER — ACETAMINOPHEN 325 MG PO TABS: 650.0000 mg | ORAL_TABLET | ORAL | Status: DC | PRN

## 2015-07-28 MED ORDER — NITROGLYCERIN 0.4 MG SL SUBL
0.4000 mg | SUBLINGUAL_TABLET | SUBLINGUAL | Status: DC | PRN
  Administered 2015-07-28: 0.4 mg via SUBLINGUAL
  Filled 2015-07-28: qty 1

## 2015-07-28 MED ORDER — METOPROLOL SUCCINATE ER 50 MG PO TB24
50.0000 mg | ORAL_TABLET | Freq: Every day | ORAL | Status: DC
  Administered 2015-07-28 – 2015-07-30 (×3): 50 mg via ORAL
  Filled 2015-07-28 (×3): qty 1

## 2015-07-28 MED ORDER — SODIUM CHLORIDE 0.9 % IJ SOLN
3.0000 mL | Freq: Two times a day (BID) | INTRAMUSCULAR | Status: DC
  Administered 2015-07-28 – 2015-07-30 (×4): 3 mL via INTRAVENOUS

## 2015-07-28 MED ORDER — LIDOCAINE HCL (PF) 1 % IJ SOLN
INTRAMUSCULAR | Status: DC | PRN
  Administered 2015-07-28: 17:00:00

## 2015-07-28 MED ORDER — HEPARIN SODIUM (PORCINE) 1000 UNIT/ML IJ SOLN
INTRAMUSCULAR | Status: DC | PRN
  Administered 2015-07-28: 5000 [IU] via INTRAVENOUS

## 2015-07-28 MED ORDER — FENTANYL CITRATE (PF) 100 MCG/2ML IJ SOLN
INTRAMUSCULAR | Status: AC
  Filled 2015-07-28: qty 2

## 2015-07-28 MED ORDER — LOSARTAN POTASSIUM 50 MG PO TABS
100.0000 mg | ORAL_TABLET | Freq: Every day | ORAL | Status: DC
  Administered 2015-07-28 – 2015-07-30 (×3): 100 mg via ORAL
  Filled 2015-07-28 (×3): qty 2

## 2015-07-28 MED ORDER — GUAIFENESIN-CODEINE 100-10 MG/5ML PO SOLN: 5.0000 mL | Freq: Four times a day (QID) | ORAL | Status: DC | PRN

## 2015-07-28 MED ORDER — DIPHENHYDRAMINE HCL 25 MG PO CAPS
50.0000 mg | ORAL_CAPSULE | Freq: Once | ORAL | Status: AC
  Administered 2015-07-28: 50 mg via ORAL
  Filled 2015-07-28: qty 2

## 2015-07-28 MED ORDER — NITROGLYCERIN IN D5W 200-5 MCG/ML-% IV SOLN
0.0000 ug/min | Freq: Once | INTRAVENOUS | Status: AC
  Administered 2015-07-28: 5 ug/min via INTRAVENOUS
  Filled 2015-07-28: qty 250

## 2015-07-28 MED ORDER — MIDAZOLAM HCL 2 MG/2ML IJ SOLN
INTRAMUSCULAR | Status: DC | PRN
  Administered 2015-07-28: 1 mg via INTRAVENOUS

## 2015-07-28 MED ORDER — IOHEXOL 350 MG/ML SOLN
80.0000 mL | Freq: Once | INTRAVENOUS | Status: AC | PRN
  Administered 2015-07-28: 80 mL via INTRAVENOUS

## 2015-07-28 MED ORDER — VERAPAMIL HCL 2.5 MG/ML IV SOLN
INTRAVENOUS | Status: DC | PRN
  Administered 2015-07-28: 17:00:00 via INTRA_ARTERIAL

## 2015-07-28 MED ORDER — VERAPAMIL HCL 2.5 MG/ML IV SOLN
INTRAVENOUS | Status: AC
  Filled 2015-07-28: qty 2

## 2015-07-28 MED ORDER — SODIUM CHLORIDE 0.9 % WEIGHT BASED INFUSION
1.0000 mL/kg/h | INTRAVENOUS | Status: AC
  Administered 2015-07-28: 1 mL/kg/h via INTRAVENOUS

## 2015-07-28 MED ORDER — AZITHROMYCIN 250 MG PO TABS
250.0000 mg | ORAL_TABLET | Freq: Once | ORAL | Status: AC
  Administered 2015-07-28: 250 mg via ORAL
  Filled 2015-07-28: qty 1

## 2015-07-28 MED ORDER — SODIUM CHLORIDE 0.9 % IJ SOLN: 3.0000 mL | Freq: Two times a day (BID) | INTRAMUSCULAR | Status: DC

## 2015-07-28 MED ORDER — HEPARIN (PORCINE) IN NACL 100-0.45 UNIT/ML-% IJ SOLN
1000.0000 [IU]/h | INTRAMUSCULAR | Status: DC
  Administered 2015-07-28: 1000 [IU]/h via INTRAVENOUS
  Filled 2015-07-28: qty 250

## 2015-07-28 MED ORDER — HEPARIN BOLUS VIA INFUSION
4000.0000 [IU] | Freq: Once | INTRAVENOUS | Status: AC
  Administered 2015-07-28: 4000 [IU] via INTRAVENOUS
  Filled 2015-07-28: qty 4000

## 2015-07-28 MED ORDER — HEPARIN (PORCINE) IN NACL 2-0.9 UNIT/ML-% IJ SOLN
INTRAMUSCULAR | Status: AC
  Filled 2015-07-28: qty 1000

## 2015-07-28 MED ORDER — SIMVASTATIN 10 MG PO TABS
10.0000 mg | ORAL_TABLET | Freq: Every day | ORAL | Status: DC
  Administered 2015-07-28 – 2015-07-29 (×2): 10 mg via ORAL
  Filled 2015-07-28 (×2): qty 1

## 2015-07-28 MED ORDER — SODIUM CHLORIDE 0.9 % WEIGHT BASED INFUSION: 1.0000 mL/kg/h | INTRAVENOUS | Status: DC

## 2015-07-28 MED ORDER — HEPARIN (PORCINE) IN NACL 100-0.45 UNIT/ML-% IJ SOLN
1100.0000 [IU]/h | INTRAMUSCULAR | Status: DC
  Administered 2015-07-29: 1100 [IU]/h via INTRAVENOUS
  Filled 2015-07-28: qty 250

## 2015-07-28 MED ORDER — SODIUM CHLORIDE 0.9 % WEIGHT BASED INFUSION: 3.0000 mL/kg/h | INTRAVENOUS | Status: DC

## 2015-07-28 MED ORDER — MORPHINE SULFATE (PF) 2 MG/ML IV SOLN
2.0000 mg | Freq: Once | INTRAVENOUS | Status: AC
  Administered 2015-07-28: 2 mg via INTRAVENOUS
  Filled 2015-07-28: qty 1

## 2015-07-28 MED ORDER — ALUM & MAG HYDROXIDE-SIMETH 200-200-20 MG/5ML PO SUSP: 30.0000 mL | Freq: Four times a day (QID) | ORAL | Status: DC | PRN

## 2015-07-28 MED ORDER — HEPARIN SODIUM (PORCINE) 1000 UNIT/ML IJ SOLN
INTRAMUSCULAR | Status: AC
  Filled 2015-07-28: qty 1

## 2015-07-28 MED ORDER — NITROGLYCERIN 0.4 MG SL SUBL: 0.4000 mg | SUBLINGUAL_TABLET | SUBLINGUAL | Status: DC | PRN

## 2015-07-28 MED ORDER — ASPIRIN EC 81 MG PO TBEC
81.0000 mg | DELAYED_RELEASE_TABLET | Freq: Every day | ORAL | Status: DC
  Administered 2015-07-29 – 2015-07-30 (×2): 81 mg via ORAL
  Filled 2015-07-28 (×2): qty 1

## 2015-07-28 MED ORDER — METHYLPREDNISOLONE SODIUM SUCC 125 MG IJ SOLR
125.0000 mg | Freq: Once | INTRAMUSCULAR | Status: AC
  Administered 2015-07-28: 125 mg via INTRAVENOUS
  Filled 2015-07-28: qty 2

## 2015-07-28 SURGICAL SUPPLY — 11 items

## 2015-07-28 NOTE — Progress Notes (Signed)
ANTICOAGULATION CONSULT NOTE - Initial Consult  Pharmacy Consult for heparin, warfarin Indication: atrial fibrillation  Allergies  Allergen Reactions  . Contrast Media [Iodinated Diagnostic Agents] Other (See Comments)    Per pt strong burning sensation starting in chest radiating outward  . Statins Other (See Comments)    "bones hurt"  . Celebrex [Celecoxib] Rash  . Tape Itching and Rash    Red Where applied and will spread    Patient Measurements: Height: 5\' 6"  (167.6 cm) Weight: 231 lb 3.2 oz (104.872 kg) IBW/kg (Calculated) : 59.3 Heparin Dosing Weight: 83 kg  Vital Signs: Temp: 97.8 F (36.6 C) (11/21 1535) Temp Source: Oral (11/21 1535) BP: 165/101 mmHg (11/21 1703) Pulse Rate: 0 (11/21 1708)  Labs:  Recent Labs  07/28/15 0757  HGB 11.8*  HCT 37.9  PLT 205  LABPROT 19.8*  INR 1.68*  CREATININE 0.72    Estimated Creatinine Clearance: 70.9 mL/min (by C-G formula based on Cr of 0.72).  Assessment: 80 yo female admitted with new onset CP  PMH: CAD, HTN, dHF, HLD, mild pulm HTN, COPD, Afib on warfarin  Anticoagulation: warfarin PTA for Afib. Now heparin to warfarin bridge post cath 11/21. INR 1.68. Last warfarin 11/20    PTA dose: 2.5 mg q Sun/Tues/Thurs, 3 mg on all other days  Cardiovascular: post cath on 11/21. Trop 0.75  Nephrology: SCr 0.72  Hematology / Oncology: H&H 11.8/37.9, Plt 205  Goal of Therapy:  INR 2-3 Heparin level 0.3-0.7 units/ml Monitor platelets by anticoagulation protocol: Yes   Plan:  -Heparin to start 8 hrs post sheath removal at 0100 -Heparin 1100 units/hr (no bolus since post-cath) -Warfarin 4 mg x 1 (~ 1.5x home dose since admit INR was low) -Initial HL at 0900 -Daily HL, CBC, INR -Monitor s/sx bleeding    Levester Fresh, PharmD, BCPS Clinical Pharmacist Pager [phone] 07/28/2015 5:34 PM

## 2015-07-28 NOTE — ED Notes (Signed)
Pt. Transferred from wheelchair to toilet, at this time pt. Found to be diaphoretic with increased CP. Pt. Placed back in bed, 2L O2 in place. Cardiology PA at bedside. Repeat EKG ordered, Dr. Burt Knack notified. Pt. Placed on NPO diet.

## 2015-07-28 NOTE — ED Notes (Signed)
Attempted report x1. 

## 2015-07-28 NOTE — H&P (Signed)
Patient ID: Faithful Bergen Flesch MRN: [id number], DOB/AGE: 12/25/1934   Admit date: 07/28/2015   Primary Physician: Karis Juba, PA-C Primary Cardiologist: Dr. [name]   [id number] M Blaney is a 80 y.o. female with past medical history of CAD (mild, nonobstructive by cath in 2006), HTN, chronic diastolic CHF, HLD, mild pulmonary HTN, COPD and chronic atrial fibrillation (on Coumadin) who presents to [facility] ED on 07/28/2015 for new-onset chest pain.  Patient reports she was seen by her PCP on 07/22/2015 for a non-productive cough and sternal chest pain. She was diagnosed with a viral URI at that time but noted her symptoms did not improve so she was prescribed Azithromycin. She is now on her last day of antibiotics and says her chest pain is still present. She notes the pain is accompanied by dyspnea, has been worse with activity, and is relieved with rest. She is still having a dry cough.  This morning at around 0200, the pain woke her from sleep and was a 8-9/10 in intensity. She says it was like a tightness along her chest and worse than the previous episodes she has been experiencing the week prior. She took an expired SL NTG but did not get any relief. The chest tightness has waxed and waned since and she now reports her chest pain is a 2-3/10 after being started on Heparin and NTG drip.  While in the ED, her initial troponin was negative with repeat value being positive at 0.75. Creatinine stable at 0.72. A CTA was performed which showed no evidence of pulmonary embolus. Small bilateral pleural effusions were noted along with peribronchial thickening Nibert reflect bronchitis.Cardiomegaly, coronary artery disease, and a stable 4.3 cm ascending thoracic aortic aneurysm were also noted.  Was last seen in the office by Dr. [name] in 01/2015 and doing well at that time. She reports good compliance with medications. Reports taking her statin one week, then taking one week off due to musculoskeletal  pains.  Her last heart catheterization was in 2006 and coronary angiography showed 20-30% stenosis in the distal left main, 20-30% ostial lesion in the left anterior descending, and no significant disease in the ramus, circumflex or RCA.  Home Medications  Prior to Admission medications   Medication Sig Start Date End Date Taking? Authorizing Provider  alum & mag hydroxide-simeth (MAALOX/MYLANTA) 200-200-20 MG/5ML suspension Take 30 mLs by mouth every 6 (six) hours as needed for indigestion or heartburn.   Yes Historical Provider, MD  azithromycin (ZITHROMAX) 250 MG tablet Take two tablets by mouth on day 1, then one tablet by mouth on 2-5. Patient taking differently: Take 250 mg by mouth daily. Take two tablets by mouth on day 1, then one tablet by mouth on 2-5. 07/24/15  Yes Orlena Sheldon, PA-C  Cholecalciferol (VITAMIN D) 2000 UNITS CAPS Take 1 capsule by mouth daily.   Yes Historical Provider, MD  furosemide (LASIX) 20 MG tablet Take 1 tablet (20 mg total) by mouth daily. 03/27/15  Yes Mary B Dixon, PA-C  losartan (COZAAR) 100 MG tablet Take 1 tablet (100 mg total) by mouth daily. 11/27/14  Yes [name], MD  metoprolol succinate (TOPROL-XL) 50 MG 24 hr tablet Take 1 tablet (50 mg total) by mouth daily. Take with or immediately following a meal. 10/29/14  Yes [name], MD  warfarin (COUMADIN) 1 MG tablet Take 1 mg by mouth daily.   Yes Historical Provider, MD  warfarin (COUMADIN) 5 MG tablet Take 0.5 tablets (2.5 mg  total) by mouth See admin instructions. Take half a tablet (2.5 mg) on Tuesdays, Thursdays, and Sundays. Take half a tablet with half a tablet of 1 mg (total 3 mg) on all other days. Patient taking differently: Take 2.5 mg by mouth See admin instructions. Take half a tablet (2.5 mg) on Tuesdays, Thursdays, and Sundays. Take half a tablet with half a tablet of 1 mg (total 3 mg) on all other days 11/27/14  Yes [name], MD  acetaminophen (TYLENOL) 325 MG tablet  Take 650 mg by mouth every 6 (six) hours as needed for mild pain.    Historical Provider, MD  diphenhydrAMINE (BENADRYL) 25 MG tablet Take 25 mg by mouth every 8 (eight) hours as needed (Bee sting).    Historical Provider, MD  guaiFENesin-codeine 100-10 MG/5ML syrup Take 5 mLs by mouth every 6 (six) hours as needed. Patient taking differently: Take 5 mLs by mouth every 6 (six) hours as needed for cough.  07/22/15   Alycia Rossetti, MD  nitroGLYCERIN (NITROSTAT) 0.4 MG SL tablet Place 1 tablet (0.4 mg total) under the tongue every 5 (five) minutes as needed for chest pain. 01/27/15   [name], MD  omeprazole (PRILOSEC) 20 MG capsule Take 20 mg by mouth daily as needed (heartburn, acid reflux).    Historical Provider, MD  OVER THE COUNTER MEDICATION Place 1 drop into both eyes daily as needed (dry eyes). Eye drops    Historical Provider, MD  Probiotic Product (ALIGN) 4 MG CAPS Take 1 capsule by mouth daily.    Historical Provider, MD  simvastatin (ZOCOR) 10 MG tablet Take 1 tablet (10 mg total) by mouth every other day. Patient not taking: Reported on 07/28/2015 11/06/14   [name], MD  triamcinolone ointment (KENALOG) 0.1 %  03/05/15   Historical Provider, MD  warfarin (COUMADIN) 1 MG tablet TAKE AS DIRECTED Patient not taking: Reported on 07/28/2015 05/23/15   Orlena Sheldon, PA-C    Allergies Allergies  Allergen Reactions  . Contrast Media [Iodinated Diagnostic Agents] Other (See Comments)    Per pt strong burning sensation starting in chest radiating outward  . Statins Other (See Comments)    "bones hurt"  . Celebrex [Celecoxib] Rash  . Tape Itching and Rash    Red Where applied and will spread    Past Medical History Past Medical History  Diagnosis Date  . Cancer [facility])     right colon and left breast  . Breast cancer ([place]) 01/06/2012  . Colon cancer ([place]) 01/06/2012  . Hernia   . GERD (gastroesophageal reflux disease)   . Hypertension   . Allergy     rhinitis  . Osteopenia    . Diverticulosis   . Pneumonia     hx child  . Hiatal hernia     denies  . Arthritis   . Coronary artery disease 2006    nonobstructive with 20-30% ostial LAD and LM  . Atrial fibrillation     chronic atrial fibrillation  . Edema extremities   . Hyperlipidemia   . COPD (chronic obstructive pulmonary disease) ([place])   . Dilated aortic root ([place])   . Pulmonary HTN (HCC)     moderate PASP 68mmHg     Surgical History Past Surgical History  Procedure Laterality Date  . Colectomy      right side  . Mastectomy partial / lumpectomy  2008    left breast  . Cardiac catheterization    . Breast surgery  lumpectomy left  . Eye surgery Bilateral 12    cataracts  . Appendectomy    . Cholecystectomy    . Excision of accessory nipple Bilateral 05/30/2013    Procedure: BILATERAL NIPPLE BIOPSY;  Surgeon: Merrie Roof, MD;  Location: [place];  Service: General;  Laterality: Bilateral;     Family History Family History  Problem Relation Age of Onset  . Heart disease Mother   . Cancer Sister     stomach and colon  . Heart disease Brother 36  . Cancer Sister     Social History Social History   Social History  . Marital Status: Married    Spouse Name: N/A  . Number of Children: N/A  . Years of Education: N/A   Occupational History  . Not on file.   Social History Main Topics  . Smoking status: Never Smoker   . Smokeless tobacco: Never Used  . Alcohol Use: No  . Drug Use: No  . Sexual Activity: Not Currently   Other Topics Concern  . Not on file   Social History Narrative     Review of Systems General:  No chills, fever, night sweats or weight changes.  Cardiovascular:  No edema, orthopnea, palpitations, paroxysmal nocturnal dyspnea. Positive for chest pain and dyspnea on exertion. Dermatological: No rash, lesions/masses Respiratory: Positive for cough and dyspnea Urologic: No hematuria, dysuria Abdominal:   No nausea, vomiting, diarrhea, bright red blood per  rectum, melena, or hematemesis Neurologic:  No visual changes, wkns, changes in mental status. All other systems reviewed and are otherwise negative except as noted above.   Physical Exam: Blood pressure 154/89, pulse 79, temperature 98 F (36.7 C), temperature source Oral, resp. rate 21, height 5\' 6"  (1.676 m), weight 220 lb (99.791 kg), SpO2 91 %.  General: Well developed, elderly Caucasian ,female in no acute distress. Head: Normocephalic, atraumatic, sclera non-icteric, no xanthomas, nares are without discharge. Dentition:  Neck: No carotid bruits. JVD not elevated.  Lungs: Respirations regular and unlabored, without wheezes or rales.  Heart: Irregularly irregular. No S3 or S4.  2/6 SEM at RUSB, no rubs, or gallops appreciated. Pain not reproducible with palpation. Abdomen: Soft, non-tender, non-distended with normoactive bowel sounds. No hepatomegaly. No rebound/guarding. No obvious abdominal masses. Msk:  Strength and tone appear normal for age. No joint deformities or effusions. Extremities: No clubbing or cyanosis. No edema.  Distal pedal pulses are 2+ bilaterally. Varicosities noted along lower extremities bilaterally. Neuro: Alert and oriented X 3. Moves all extremities spontaneously. No focal deficits noted. Psych:  Responds to questions appropriately with a normal affect. Skin: No rashes or lesions noted   Labs Lab Results  Component Value Date   WBC 8.3 07/28/2015   HGB 11.8* 07/28/2015   HCT 37.9 07/28/2015   MCV 93.6 07/28/2015   PLT 205 07/28/2015    Recent Labs Lab 07/28/15 0745 07/28/15 0757  NA  --  138  K  --  4.1  CL  --  102  CO2  --  28  BUN  --  15  CREATININE  --  0.72  CALCIUM  --  9.6  PROT 7.8  --   BILITOT 0.5  --   ALKPHOS 72  --   ALT 20  --   AST 25  --   GLUCOSE  --  149*   Troponin (Point of Care Test)  Recent Labs  07/28/15 1048  TROPIPOC 0.75*    Recent Labs  07/28/15 0757  INR 1.68*   Lab Results  Component Value Date     CHOL 150 12/24/2014   HDL 64.50 12/24/2014   LDLCALC 70 12/24/2014   TRIG 80.0 12/24/2014    ECG: Atrial fibrillation with rate in the 70's.  Echo: 08/2014 Study Conclusions - Left ventricle: The cavity size was normal. Wall thickness was normal. Systolic function was normal. The estimated ejection fraction was in the range of 55% to 60%. - Aortic valve: There was mild stenosis. Mean gradient (S): 7 mm Hg. Peak gradient (S): 12 mm Hg. - Mitral valve: Calcified annulus. Mildly thickened leaflets . There was mild regurgitation. - Left atrium: The atrium was moderately dilated. - Right atrium: The atrium was moderately dilated. - Atrial septum: No defect or patent foramen ovale was identified. - Pulmonary arteries: PA peak pressure: 37 mm Hg (S).   Radiology/Studies: Dg Chest 2 View: 07/28/2015  CLINICAL DATA:  Epigastric pain for 1 week. Pain was worse this morning. EXAM: CHEST  2 VIEW COMPARISON:  11/04/2013 and chest CT 03/18/2014 FINDINGS: Prominent interstitial lung markings have slightly progressed since the previous examination but suspect there is a chronic component. Heart size is upper limits of normal but unchanged. Mild scarring at the lung apices. The trachea is midline. No large pleural effusions. Degenerative endplate changes in the thoracic spine. IMPRESSION: Prominent interstitial lung markings likely represent chronic changes but difficult to exclude mild interstitial edema. No focal airspace disease. Electronically Signed   By: Markus Daft M.D.   On: 07/28/2015 08:09   Ct Angio Chest Pe W/cm &/or Wo Cm: 07/28/2015  CLINICAL DATA:  Shortness of breath, chest pain for 1 week. Pain worsening this morning. Weakness, nausea this morning. EXAM: CT ANGIOGRAPHY CHEST WITH CONTRAST TECHNIQUE: Multidetector CT imaging of the chest was performed using the standard protocol during bolus administration of intravenous contrast. Multiplanar CT image reconstructions and MIPs were  obtained to evaluate the vascular anatomy. CONTRAST:  67mL OMNIPAQUE IOHEXOL 350 MG/ML SOLN COMPARISON:  Chest x-ray earlier today.  Chest CT 03/18/2014 FINDINGS: No filling defects in the pulmonary arteries to suggest pulmonary emboli. There are small bilateral pleural effusions. No confluent airspace opacities. There are areas of bronchial wall thickening and mucous plugging within the airways bilaterally. Mild interstitial prominence peripherally and in the apices and bases likely reflect chronic lung disease. No confluent opacities. There is cardiomegaly. Aortic and coronary artery calcifications. Ascending aorta mildly dilated at 4.3 cm, stable when measured in the same planes on prior study. No mediastinal, hilar, or axillary adenopathy. Chest wall soft tissues are unremarkable. Imaging into the upper abdomen shows no acute findings. Calcifications in the spleen compatible with old granulomatous disease. No acute bony abnormality or focal bone lesion. Review of the MIP images confirms the above findings. IMPRESSION: No evidence of pulmonary embolus. Small bilateral pleural effusions. Peribronchial thickening Cooprider reflect bronchitis. There is mucous plugging within several airways bilaterally. Cardiomegaly, coronary artery disease. 4.3 cm ascending thoracic aortic aneurysm, stable. Electronically Signed   By: Rolm Baptise M.D.   On: 07/28/2015 10:40    ASSESSMENT AND PLAN:   1. NSTEMI - history of nonobstructive CAD by cath in 2006 with 20-30% stenosis in the distal left main, 20-30% ostial lesion in the left anterior descending, and no significant disease in the ramus, circumflex or RCA. - initially had chest pain occurring in setting of viral URI, however chest pain has persisted and is worse with activity. It is not reproducible with palpation. Had episode of pain  that awoke her from sleep. Relieved with NTG in the ED. Associated with dyspnea on exertion. - CTA negative for PE. Did show evidence of  CAD. - initial troponin negative. Repeat value 0.75. Will cycle cardiac enzymes. - plan for LHC in AM. NPO after midnight. - continue ASA, statin, heparin, BB, and ARB.  2. Chronic Diastolic CHF - EF 0000000 in 2015. - does not appear to be in acute HF at this time. - will continue home Lasix dosage  3. HTN - continue ARB.  4. Chronic Atrial Fibrillation - on Coumadin PTA. Currently on Heparin for ACS. Will need to resume Coumadin at time of discharge. - continue Toprol-XL for rate control.  5. HLD  - continue statin  6. COPD - "mild" according to the patient. Not on any home medications. - O2 saturation 88% on RA upon arrival to the ED. 96% on 4L [place]. - finishing last pill of Azithromycin today for previous URI.  7. Ascending Thoracic Aortic Aneurysm - 4.3 cm ascending thoracic aortic aneurysm by CT on 07/28/2015 - previously 4cm in 03/2014. - follow-up as outpatient  Signed, Erma Heritage, PA-C 07/28/2015, 12:46 PM Pager: [phone]  Patient seen, examined. Available data reviewed. Agree with findings, assessment, and plan as outlined by Bernerd Pho, PA-C. The patient is an alert, oriented woman in no acute distress. Carotids without bruit, lungs with decreased BS in the bases, heart is irregular no murmur, extremities without edema.   EKG #1 with atrial fibrillation, no acute changes EKG #2 with atrial fibrillation with RVR, nonspecific IVCD, repolarization abnormality  Cardiac markers positive with trop #2 of 0.75.   Pt with NSTEMI, crescendo symptoms, positive enzymes, severe CP this am, now mild CP. Initially planned on heparin, NTG, cath in am. However, after my evaluation she developed recurrent severe CP just with getting up to go to the bathroom. Now associated with new intraventricular conduction delay. Plan urgent cath +/- PCI. I have reviewed the risks, indications, and alternatives to cardiac catheterization and possible angioplasty/stenting with the  patient. Risks include but are not limited to bleeding, infection, vascular injury, stroke, myocardial infection, arrhythmia, kidney injury, radiation-related injury in the case of prolonged fluoroscopy use, emergency cardiac surgery, and death. The patient understands the risks of serious complication is low ([id number]). Will proceed with next available case.  Sherren Mocha, M.D. 07/28/2015 3:05 PM

## 2015-07-28 NOTE — Progress Notes (Signed)
CRITICAL VALUE ALERT  Critical value received:  Trop 1.54 Date of notification: 07/28/2015  Time of notification:  2000  Critical value read back:yes  Nurse who received alert:  Ebony Hail  MD notified (1st page): Ellen Henri  Time of first page: 2005  MD notified (2nd page):  Time of second page:  Responding MD:  [place]  Time MD responded: 2010

## 2015-07-28 NOTE — ED Notes (Signed)
Pt awaken by new onset CP, hx afib, initially nauseous.  Pt reporting diaphoresis,weakness and SOB, denies dizziness, LOC.  EMS reports elevations in V II, III, IV, which resolved after pt received:  324 ASA 4 NTG 6mg  morphine  Pt reports pain 4/10, satting at 88% RA.  Pt currently on 4L at 96%, reports no use of O2 at home.

## 2015-07-28 NOTE — Interval H&P Note (Signed)
History and Physical Interval Note:  07/28/2015 4:31 PM  [name]  has presented today for surgery, with the diagnosis of cp  The various methods of treatment have been discussed with the patient and family. After consideration of risks, benefits and other options for treatment, the patient has consented to  Procedure(s): Left Heart Cath and Coronary Angiography (N/A) as a surgical intervention .  The patient's history has been reviewed, patient examined, no change in status, stable for surgery.  I have reviewed the patient's chart and labs.  Questions were answered to the patient's satisfaction.   Cath Lab Visit (complete for each Cath Lab visit)  Clinical Evaluation Leading to the Procedure:   ACS: Yes.    Non-ACS:    Anginal Classification: CCS IV  Anti-ischemic medical therapy: Minimal Therapy (1 class of medications)  Non-Invasive Test Results: No non-invasive testing performed  Prior CABG: No previous CABG        Collier [facility] 07/28/2015 4:31 PM

## 2015-07-28 NOTE — Progress Notes (Signed)
ANTICOAGULATION CONSULT NOTE - Initial Consult  Pharmacy Consult for heparin Indication: chest pain/ACS  Allergies  Allergen Reactions  . Contrast Media [Iodinated Diagnostic Agents] Other (See Comments)    Per pt strong burning sensation starting in chest radiating outward  . Statins Other (See Comments)    "bones hurt"  . Celebrex [Celecoxib] Rash  . Tape Itching and Rash    Red Where applied and will spread    Patient Measurements: Height: 5\' 6"  (167.6 cm) Weight: 220 lb (99.791 kg) IBW/kg (Calculated) : 59.3 Heparin Dosing Weight: 81.8 kg  Vital Signs: Temp: 98 F (36.7 C) (11/21 0734) Temp Source: Oral (11/21 0734) BP: 145/90 mmHg (11/21 1045) Pulse Rate: 81 (11/21 1045)  Labs:  Recent Labs  07/28/15 0757  HGB 11.8*  HCT 37.9  PLT 205  LABPROT 19.8*  INR 1.68*  CREATININE 0.72    Estimated Creatinine Clearance: 69.1 mL/min (by C-G formula based on Cr of 0.72).   Medical History: Past Medical History  Diagnosis Date  . Cancer [facility])     right colon and left breast  . Breast cancer ([place]) 01/06/2012  . Colon cancer ([place]) 01/06/2012  . Hernia   . GERD (gastroesophageal reflux disease)   . Hypertension   . Allergy     rhinitis  . Osteopenia   . Diverticulosis   . Pneumonia     hx child  . Hiatal hernia     denies  . Arthritis   . Coronary artery disease 2006    nonobstructive with 20-30% ostial LAD and LM  . Atrial fibrillation     chronic atrial fibrillation  . Edema extremities   . Hyperlipidemia   . COPD (chronic obstructive pulmonary disease) ([place])   . Dilated aortic root ([place])   . Pulmonary HTN (HCC)     moderate PASP 9mmHg    Medications:  Warfarin PTA Currently on heparin gtt  Assessment: Pt admitted with new onset chest pain, nasuea, diaphoresis, started on heparin gtt for ACS. Has a history of AFib, pt is on warfarin PTA, current INR subtherapeutic 1.68. PTA dose: 2.5 mg qSun/Tues/Thurs, 3 mg on all other days.  Goal of Therapy:   Heparin level 0.3-0.7 units/ml Monitor platelets by anticoagulation protocol: Yes   Plan:  -Heparin bolus 4000 units x1 then 1000 units/hr -Daily HL, CBC -Monitor s/sx bleeding  -Check 8 hour level    Hughes Better, PharmD, BCPS Clinical Pharmacist Pager: [phone] 07/28/2015 11:07 AM

## 2015-07-28 NOTE — ED Provider Notes (Signed)
CSN: [id number]     Arrival date & time 07/28/15  [id number] History   First MD Initiated Contact with Patient 07/28/15 [phone]     Chief Complaint  Patient presents with  . Chest Pain     (Consider location/radiation/quality/duration/timing/severity/associated sxs/prior Treatment) Patient is a 80 y.o. female presenting with chest pain. The history is provided by the patient.  Chest Pain Pain location:  Substernal area Pain quality: crushing and dull   Pain radiates to:  Upper back Pain radiates to the back: yes   Pain severity:  Severe Onset quality:  Sudden Duration:  2 hours Timing:  Constant Progression:  Unchanged Chronicity:  New Relieved by:  Nothing Worsened by:  Nothing tried Ineffective treatments:  None tried Associated symptoms: back pain and fatigue   Associated symptoms: no dizziness, no fever, no headache, no nausea, no palpitations, no shortness of breath, no syncope, not vomiting and no weakness   Risk factors: coronary artery disease, diabetes mellitus, high cholesterol and hypertension   Risk factors: no birth control     80 yo F with a chief complaint chest pain. This woke her up from sleep about 2 hours prior to arrival. Feels like a pressure radiates into her back. Patient has had similar pains over the past couple weeks or so. This morning and was associated with diaphoresis shortness of breath and some nausea. Patient denies vomiting. Denies radiation of the pain other than to the back. Has had lower extremity swelling is unsure if it's worse. Left lower extremity is usually much larger than right. Patient denies cough congestion fevers chills. Patient denies history of prior PE or DVT. Patient is on Coumadin for atrial fibrillation.  Past Medical History  Diagnosis Date  . Cancer [facility])     right colon and left breast  . Breast cancer ([place]) 01/06/2012  . Colon cancer ([place]) 01/06/2012  . Hernia   . GERD (gastroesophageal reflux disease)   . Hypertension   .  Allergy     rhinitis  . Osteopenia   . Diverticulosis   . Pneumonia     hx child  . Hiatal hernia     denies  . Arthritis   . Coronary artery disease 2006    nonobstructive with 20-30% ostial LAD and LM  . Atrial fibrillation     chronic atrial fibrillation  . Edema extremities   . Hyperlipidemia   . COPD (chronic obstructive pulmonary disease) ([place])   . Dilated aortic root ([place])   . Pulmonary HTN ([facility])     moderate PASP 65mmHg   Past Surgical History  Procedure Laterality Date  . Colectomy      right side  . Mastectomy partial / lumpectomy  2008    left breast  . Cardiac catheterization    . Breast surgery      lumpectomy left  . Eye surgery Bilateral 12    cataracts  . Appendectomy    . Cholecystectomy    . Excision of accessory nipple Bilateral 05/30/2013    Procedure: BILATERAL NIPPLE BIOPSY;  Surgeon: Merrie Roof, MD;  Location: [place];  Service: General;  Laterality: Bilateral;   Family History  Problem Relation Age of Onset  . Heart disease Mother   . Cancer Sister     stomach and colon  . Heart disease Brother 61  . Cancer Sister    Social History  Substance Use Topics  . Smoking status: Never Smoker   . Smokeless tobacco: Never Used  .  Alcohol Use: No   OB History    No data available     Review of Systems  Constitutional: Positive for fatigue. Negative for fever and chills.  HENT: Negative for congestion and rhinorrhea.   Eyes: Negative for redness and visual disturbance.  Respiratory: Negative for shortness of breath and wheezing.   Cardiovascular: Positive for chest pain. Negative for palpitations and syncope.  Gastrointestinal: Negative for nausea and vomiting.  Genitourinary: Negative for dysuria and urgency.  Musculoskeletal: Positive for back pain. Negative for myalgias and arthralgias.  Skin: Negative for pallor and wound.  Neurological: Negative for dizziness, weakness and headaches.      Allergies  Contrast media; Statins;  Celebrex; and Tape  Home Medications   Prior to Admission medications   Medication Sig Start Date End Date Taking? Authorizing Provider  alum & mag hydroxide-simeth (MAALOX/MYLANTA) 200-200-20 MG/5ML suspension Take 30 mLs by mouth every 6 (six) hours as needed for indigestion or heartburn.   Yes Historical Provider, MD  azithromycin (ZITHROMAX) 250 MG tablet Take two tablets by mouth on day 1, then one tablet by mouth on 2-5. Patient taking differently: Take 250 mg by mouth daily. Take two tablets by mouth on day 1, then one tablet by mouth on 2-5. 07/24/15  Yes Orlena Sheldon, PA-C  Cholecalciferol (VITAMIN D) 2000 UNITS CAPS Take 1 capsule by mouth daily.   Yes Historical Provider, MD  furosemide (LASIX) 20 MG tablet Take 1 tablet (20 mg total) by mouth daily. 03/27/15  Yes Mary B Dixon, PA-C  losartan (COZAAR) 100 MG tablet Take 1 tablet (100 mg total) by mouth daily. 11/27/14  Yes [name], MD  metoprolol succinate (TOPROL-XL) 50 MG 24 hr tablet Take 1 tablet (50 mg total) by mouth daily. Take with or immediately following a meal. 10/29/14  Yes [name], MD  warfarin (COUMADIN) 1 MG tablet Take 1 mg by mouth daily.   Yes Historical Provider, MD  warfarin (COUMADIN) 5 MG tablet Take 0.5 tablets (2.5 mg total) by mouth See admin instructions. Take half a tablet (2.5 mg) on Tuesdays, Thursdays, and Sundays. Take half a tablet with half a tablet of 1 mg (total 3 mg) on all other days. Patient taking differently: Take 2.5 mg by mouth See admin instructions. Take half a tablet (2.5 mg) on Tuesdays, Thursdays, and Sundays. Take half a tablet with half a tablet of 1 mg (total 3 mg) on all other days 11/27/14  Yes [name], MD  acetaminophen (TYLENOL) 325 MG tablet Take 650 mg by mouth every 6 (six) hours as needed for mild pain.    Historical Provider, MD  diphenhydrAMINE (BENADRYL) 25 MG tablet Take 25 mg by mouth every 8 (eight) hours as needed (Bee sting).    Historical Provider,  MD  guaiFENesin-codeine 100-10 MG/5ML syrup Take 5 mLs by mouth every 6 (six) hours as needed. Patient taking differently: Take 5 mLs by mouth every 6 (six) hours as needed for cough.  07/22/15   Alycia Rossetti, MD  nitroGLYCERIN (NITROSTAT) 0.4 MG SL tablet Place 1 tablet (0.4 mg total) under the tongue every 5 (five) minutes as needed for chest pain. 01/27/15   [name], MD  omeprazole (PRILOSEC) 20 MG capsule Take 20 mg by mouth daily as needed (heartburn, acid reflux).    Historical Provider, MD  OVER THE COUNTER MEDICATION Place 1 drop into both eyes daily as needed (dry eyes). Eye drops    Historical Provider, MD  Probiotic Product (ALIGN) 4 MG CAPS Take 1 capsule by mouth daily.    Historical Provider, MD  simvastatin (ZOCOR) 10 MG tablet Take 1 tablet (10 mg total) by mouth every other day. Patient not taking: Reported on 07/28/2015 11/06/14   [name], MD  triamcinolone ointment (KENALOG) 0.1 %  03/05/15   Historical Provider, MD  warfarin (COUMADIN) 1 MG tablet TAKE AS DIRECTED Patient not taking: Reported on 07/28/2015 05/23/15   Orlena Sheldon, PA-C   BP 179/87 mmHg  Pulse 83  Temp(Src) 98 F (36.7 C) (Oral)  Resp 17  Ht 5\' 6"  (1.676 m)  Wt 220 lb (99.791 kg)  BMI 35.53 kg/m2  SpO2 95% Physical Exam  Constitutional: She is oriented to person, place, and time. She appears well-developed and well-nourished. No distress.  HENT:  Head: Normocephalic and atraumatic.  Eyes: EOM are normal. Pupils are equal, round, and reactive to light.  Neck: Normal range of motion. Neck supple.  Cardiovascular: Normal rate and regular rhythm.  Exam reveals no gallop and no friction rub.   No murmur heard. Pulmonary/Chest: Effort normal. She has no wheezes. She has no rales.  Abdominal: Soft. She exhibits no distension. There is no tenderness.  Musculoskeletal: She exhibits edema. She exhibits no tenderness.  LLE >RLE, chronic per patient  Neurological: She is alert and oriented to  person, place, and time.  Skin: Skin is warm and dry. She is not diaphoretic.  Psychiatric: She has a normal mood and affect. Her behavior is normal.  Nursing note and vitals reviewed.   ED Course  Procedures (including critical care time) Labs Review Labs Reviewed  BASIC METABOLIC PANEL - Abnormal; Notable for the following:    Glucose, Bld 149 (*)    All other components within normal limits  CBC - Abnormal; Notable for the following:    Hemoglobin 11.8 (*)    All other components within normal limits  PROTIME-INR - Abnormal; Notable for the following:    Prothrombin Time 19.8 (*)    INR 1.68 (*)    All other components within normal limits  I-STAT TROPOININ, ED - Abnormal; Notable for the following:    Troponin i, poc 0.75 (*)    All other components within normal limits  HEPATIC FUNCTION PANEL  LIPASE, BLOOD  HEPARIN LEVEL (UNFRACTIONATED)  I-STAT TROPOININ, ED  Randolm Idol, ED    Imaging Review Dg Chest 2 View  07/28/2015  CLINICAL DATA:  Epigastric pain for 1 week. Pain was worse this morning. EXAM: CHEST  2 VIEW COMPARISON:  11/04/2013 and chest CT 03/18/2014 FINDINGS: Prominent interstitial lung markings have slightly progressed since the previous examination but suspect there is a chronic component. Heart size is upper limits of normal but unchanged. Mild scarring at the lung apices. The trachea is midline. No large pleural effusions. Degenerative endplate changes in the thoracic spine. IMPRESSION: Prominent interstitial lung markings likely represent chronic changes but difficult to exclude mild interstitial edema. No focal airspace disease. Electronically Signed   By: Markus Daft M.D.   On: 07/28/2015 08:09   Ct Angio Chest Pe W/cm &/or Wo Cm  07/28/2015  CLINICAL DATA:  Shortness of breath, chest pain for 1 week. Pain worsening this morning. Weakness, nausea this morning. EXAM: CT ANGIOGRAPHY CHEST WITH CONTRAST TECHNIQUE: Multidetector CT imaging of the chest was  performed using the standard protocol during bolus administration of intravenous contrast. Multiplanar CT image reconstructions and MIPs were obtained to evaluate the vascular anatomy. CONTRAST:  79mL OMNIPAQUE IOHEXOL 350 MG/ML SOLN COMPARISON:  Chest x-ray earlier today.  Chest CT 03/18/2014 FINDINGS: No filling defects in the pulmonary arteries to suggest pulmonary emboli. There are small bilateral pleural effusions. No confluent airspace opacities. There are areas of bronchial wall thickening and mucous plugging within the airways bilaterally. Mild interstitial prominence peripherally and in the apices and bases likely reflect chronic lung disease. No confluent opacities. There is cardiomegaly. Aortic and coronary artery calcifications. Ascending aorta mildly dilated at 4.3 cm, stable when measured in the same planes on prior study. No mediastinal, hilar, or axillary adenopathy. Chest wall soft tissues are unremarkable. Imaging into the upper abdomen shows no acute findings. Calcifications in the spleen compatible with old granulomatous disease. No acute bony abnormality or focal bone lesion. Review of the MIP images confirms the above findings. IMPRESSION: No evidence of pulmonary embolus. Small bilateral pleural effusions. Peribronchial thickening Sinagra reflect bronchitis. There is mucous plugging within several airways bilaterally. Cardiomegaly, coronary artery disease. 4.3 cm ascending thoracic aortic aneurysm, stable. Electronically Signed   By: Rolm Baptise M.D.   On: 07/28/2015 10:40   I have personally reviewed and evaluated these images and lab results as part of my medical decision-making.   EKG Interpretation   Date/Time:  Monday July 28 2015 07:32:03 EST Ventricular Rate:  78 PR Interval:    QRS Duration: 78 QT Interval:  423 QTC Calculation: 482 R Axis:   74 Text Interpretation:  Atrial fibrillation No significant change since last  tracing Confirmed by Lanyah Spengler MD, DANIEL [id number]) on  07/28/2015 7:38:58 AM      MDM   Final diagnoses:  Shortness of breath  NSTEMI (non-ST elevated myocardial infarction) [facility])    80 yo F with a chief complaint of chest pain. Pain radiates to the back. Initial troponin negative. EKG unremarkable. Story concerning for possible cardiac disease. However patient also on a new 4 L oxygen requirement. Will obtain a CT PE study.  With patient's pain rating to the back while on a lipase. Pain was significantly improved with nitrates and morphine.  Patient's pain recur. Second troponin elevated at 0.75. CT scan negative for PE or dissection. Likely NSTEMI. Start on heparin drip started on nitro drip. Cards consult for admission.  CRITICAL CARE Performed by: Cecilio Asper   Total critical care time: 35 minutes  Critical care time was exclusive of separately billable procedures and treating other patients.  Critical care was necessary to treat or prevent imminent or life-threatening deterioration.  Critical care was time spent personally by me on the following activities: development of treatment plan with patient and/or surrogate as well as nursing, discussions with consultants, evaluation of patient's response to treatment, examination of patient, obtaining history from patient or surrogate, ordering and performing treatments and interventions, ordering and review of laboratory studies, ordering and review of radiographic studies, pulse oximetry and re-evaluation of patient's condition.  The patients results and plan were reviewed and discussed.   Any x-rays performed were independently reviewed by myself.   Differential diagnosis were considered with the presenting HPI.  Medications  nitroGLYCERIN (NITROSTAT) SL tablet 0.4 mg (0.4 mg Sublingual Given 07/28/15 1034)  heparin ADULT infusion 100 units/mL (25000 units/250 mL) (1,000 Units/hr Intravenous New Bag/Given 07/28/15 1114)  morphine 2 MG/ML injection 2 mg (2 mg Intravenous Given  07/28/15 0845)  methylPREDNISolone sodium succinate (SOLU-MEDROL) 125 mg/2 mL injection 125 mg (125 mg Intravenous Given 07/28/15 0909)  diphenhydrAMINE (BENADRYL) capsule 50 mg (50 mg Oral Given 07/28/15  [id number])  iohexol (OMNIPAQUE) 350 MG/ML injection 80 mL (80 mLs Intravenous Contrast Given 07/28/15 1017)  nitroGLYCERIN 50 mg in dextrose 5 % 250 mL (0.2 mg/mL) infusion (10 mcg/min Intravenous Rate/Dose Change 07/28/15 1145)  heparin bolus via infusion 4,000 Units (4,000 Units Intravenous Given 07/28/15 1116)    Filed Vitals:   07/28/15 1145 07/28/15 1200 07/28/15 1245 07/28/15 1315  BP: 160/91 154/89 155/80 179/87  Pulse: 79 79 76 83  Temp:      TempSrc:      Resp: 21 21 23 17   Height:      Weight:      SpO2: 95% 91% 94% 95%    Final diagnoses:  Shortness of breath  NSTEMI (non-ST elevated myocardial infarction) [facility])    Admission/ observation were discussed with the admitting physician, patient and/or family and they are comfortable with the plan.     Deno Etienne, DO 07/28/15 1340

## 2015-07-28 NOTE — ED Notes (Signed)
Ordered Carb Modified Diet °

## 2015-07-29 ENCOUNTER — Encounter (HOSPITAL_COMMUNITY): Payer: Self-pay | Admitting: Cardiology

## 2015-07-29 ENCOUNTER — Inpatient Hospital Stay (HOSPITAL_COMMUNITY): Payer: Medicare Other

## 2015-07-29 ENCOUNTER — Ambulatory Visit: Payer: Medicare Other | Admitting: Cardiology

## 2015-07-29 DIAGNOSIS — I425 Other restrictive cardiomyopathy: Secondary | ICD-10-CM

## 2015-07-29 DIAGNOSIS — I5181 Takotsubo syndrome: Secondary | ICD-10-CM

## 2015-07-29 HISTORY — DX: Takotsubo syndrome: I51.81

## 2015-07-29 LAB — CBC
HEMATOCRIT: 34.3 % — AB (ref 36.0–46.0)
Hemoglobin: 11.2 g/dL — ABNORMAL LOW (ref 12.0–15.0)
MCH: 30.3 pg (ref 26.0–34.0)
MCHC: 32.7 g/dL (ref 30.0–36.0)
MCV: 92.7 fL (ref 78.0–100.0)
PLATELETS: 213 10*3/uL (ref 150–400)
RBC: 3.7 MIL/uL — AB (ref 3.87–5.11)
RDW: 14.3 % (ref 11.5–15.5)
WBC: 8.4 10*3/uL (ref 4.0–10.5)

## 2015-07-29 LAB — TROPONIN I: Troponin I: 1.18 ng/mL (ref <0.031)

## 2015-07-29 LAB — LIPID PANEL
Cholesterol: 144 mg/dL (ref 0–200)
HDL: 49 mg/dL (ref >40)
LDL CALC: 80 mg/dL (ref 0–99)
TRIGLYCERIDES: 73 mg/dL (ref <150)
Total CHOL/HDL Ratio: 2.9 RATIO
VLDL: 15 mg/dL (ref 0–40)

## 2015-07-29 LAB — BASIC METABOLIC PANEL
Anion gap: 9 (ref 5–15)
BUN: 16 mg/dL (ref 6–20)
CO2: 25 mmol/L (ref 22–32)
Calcium: 9.7 mg/dL (ref 8.9–10.3)
Chloride: 103 mmol/L (ref 101–111)
Creatinine, Ser: 0.71 mg/dL (ref 0.44–1.00)
GFR calc Af Amer: >60 mL/min (ref >60)
GLUCOSE: 136 mg/dL — AB (ref 65–99)
POTASSIUM: 4.5 mmol/L (ref 3.5–5.1)
Sodium: 137 mmol/L (ref 135–145)

## 2015-07-29 LAB — PROTIME-INR
INR: 1.72 — ABNORMAL HIGH (ref 0.00–1.49)
PROTHROMBIN TIME: 20.1 s — AB (ref 11.6–15.2)

## 2015-07-29 MED ORDER — ISOSORBIDE MONONITRATE ER 30 MG PO TB24
30.0000 mg | ORAL_TABLET | Freq: Every day | ORAL | Status: DC
  Administered 2015-07-29 – 2015-07-30 (×2): 30 mg via ORAL
  Filled 2015-07-29: qty 1

## 2015-07-29 MED ORDER — TRAZODONE HCL 50 MG PO TABS
50.0000 mg | ORAL_TABLET | Freq: Every evening | ORAL | Status: DC | PRN
  Administered 2015-07-29: 50 mg via ORAL
  Filled 2015-07-29: qty 1

## 2015-07-29 MED ORDER — WARFARIN SODIUM 4 MG PO TABS
4.0000 mg | ORAL_TABLET | Freq: Once | ORAL | Status: AC
  Administered 2015-07-29: 4 mg via ORAL
  Filled 2015-07-29: qty 1

## 2015-07-29 MED ORDER — ZOLPIDEM TARTRATE 5 MG PO TABS
5.0000 mg | ORAL_TABLET | Freq: Every evening | ORAL | Status: DC | PRN
  Administered 2015-07-29: 5 mg via ORAL
  Filled 2015-07-29: qty 1

## 2015-07-29 NOTE — Progress Notes (Signed)
Utilization review completed. Shamila Lerch, RN, BSN. 

## 2015-07-29 NOTE — Progress Notes (Signed)
ANTICOAGULATION CONSULT NOTE - Follow Up Consult  Pharmacy Consult for Coumadin Indication: atrial fibrillation  Allergies  Allergen Reactions  . Contrast Media [Iodinated Diagnostic Agents] Other (See Comments)    Per pt strong burning sensation starting in chest radiating outward  . Statins Other (See Comments)    "bones hurt"  . Celebrex [Celecoxib] Rash  . Tape Itching and Rash    Red Where applied and will spread    Patient Measurements: Height: 5\' 6"  (167.6 cm) Weight: 225 lb 14.4 oz (102.468 kg) IBW/kg (Calculated) : 59.3  Vital Signs: Temp: 97.4 F (36.3 C) (11/22 0838) Temp Source: Oral (11/22 0838) BP: 137/62 mmHg (11/22 0829) Pulse Rate: 73 (11/22 0829)  Labs:  Recent Labs  07/28/15 0757 07/28/15 1833 07/28/15 2130 07/29/15 0405  HGB 11.8*  --   --  11.2*  HCT 37.9  --   --  34.3*  PLT 205  --   --  213  LABPROT 19.8*  --   --  20.1*  INR 1.68*  --   --  1.72*  CREATININE 0.72  --   --  0.71  TROPONINI  --  1.54* 1.55* 1.18*    Estimated Creatinine Clearance: 70.1 mL/min (by C-G formula based on Cr of 0.71).  Assessment:   Coumadin resumed with 4 mg x 1 on 11/21 post-cath.  INR 1.68>1.72.   Heparin bridge begun last night, but stopped this am before first heparin level drawn.   Home Coumadin regimen: 2.5 mg Tues-Thurs-Sun, 3 mg Mon-Wed-Fri-Sat.  Has 5 mg and 1 mg tablets at home.    Goal of Therapy:  INR 2-3 Monitor platelets by anticoagulation protocol: Yes   Plan:   Repeat Coumadin 4 mg x 1 today.  Continue daily PT/INR.  Arty Baumgartner, [place] Pager: [phone] 07/29/2015,10:35 AM

## 2015-07-29 NOTE — Discharge Instructions (Addendum)
Information on my medicine - Coumadin®   (Warfarin) ° °This medication education was reviewed with me or my healthcare representative as part of my discharge preparation.  The pharmacist that spoke with me during my hospital stay was:  Egan, Theresa Donovan, RPH ° °Why was Coumadin prescribed for you? °Coumadin was prescribed for you because you have a blood clot or a medical condition that can cause an increased risk of forming blood clots. Blood clots can cause serious health problems by blocking the flow of blood to the heart, lung, or brain. Coumadin can prevent harmful blood clots from forming. °As a reminder your indication for Coumadin is:   Stroke Prevention Because Of Atrial Fibrillation ° °What test will check on my response to Coumadin? °While on Coumadin (warfarin) you will need to have an INR test regularly to ensure that your dose is keeping you in the desired range. The INR (international normalized ratio) number is calculated from the result of the laboratory test called prothrombin time (PT). ° °If an INR APPOINTMENT HAS NOT ALREADY BEEN MADE FOR YOU please schedule an appointment to have this lab work done by your health care provider within 7 days. °Your INR goal is usually a number between:  2 to 3 or your provider Yuhasz give you a more narrow range like 2-2.5.  Ask your health care provider during an office visit what your goal INR is. ° °What  do you need to  know  About  COUMADIN? °Take Coumadin (warfarin) exactly as prescribed by your healthcare provider about the same time each day.  DO NOT stop taking without talking to the doctor who prescribed the medication.  Stopping without other blood clot prevention medication to take the place of Coumadin [place] increase your risk of developing a new clot or stroke.  Get refills before you run out. ° °What do you do if you miss a dose? °If you miss a dose, take it as soon as you remember on the same day then continue your regularly scheduled regimen the  next day.  Do not take two doses of Coumadin at the same time. ° °Important Safety Information °A possible side effect of Coumadin (Warfarin) is an increased risk of bleeding. You should call your healthcare provider right away if you experience any of the following: °? Bleeding from an injury or your nose that does not stop. °? Unusual colored urine (red or dark brown) or unusual colored stools (red or black). °? Unusual bruising for unknown reasons. °? A serious fall or if you hit your head (even if there is no bleeding). ° °Some foods or medicines interact with Coumadin® (warfarin) and might alter your response to warfarin. To help avoid this: °? Eat a balanced diet, maintaining a consistent amount of Vitamin K. °? Notify your provider about major diet changes you plan to make. °? Avoid alcohol or limit your intake to 1 drink for women and 2 drinks for men per day. °(1 drink is 5 oz. wine, 12 oz. beer, or 1.5 oz. liquor.) ° °Make sure that ANY health care provider who prescribes medication for you knows that you are taking Coumadin (warfarin).  Also make sure the healthcare provider who is monitoring your Coumadin knows when you have started a new medication including herbals and non-prescription products. ° °Coumadin® (Warfarin)  Major Drug Interactions  °Increased Warfarin Effect Decreased Warfarin Effect  °Alcohol (large quantities) °Antibiotics (esp. Septra/Bactrim, Flagyl, Cipro) °Amiodarone (Cordarone) °Aspirin (ASA) °Cimetidine (Tagamet) °Megestrol (Megace) °NSAIDs (ibuprofen,   naproxen, etc.) Piroxicam (Feldene) Propafenone (Rythmol SR) Propranolol (Inderal) Isoniazid (INH) Posaconazole (Noxafil) Barbiturates (Phenobarbital) Carbamazepine (Tegretol) Chlordiazepoxide (Librium) Cholestyramine (Questran) Griseofulvin Oral Contraceptives Rifampin Sucralfate (Carafate) Vitamin K   Coumadin (Warfarin) Major Herbal Interactions  Increased Warfarin Effect Decreased Warfarin Effect   Garlic Ginseng Ginkgo biloba Coenzyme Q10 Green tea St. Johns wort    Coumadin (Warfarin) FOOD Interactions  Eat a consistent number of servings per week of foods HIGH in Vitamin K (1 serving =  cup)  Collards (cooked, or boiled & drained) Kale (cooked, or boiled & drained) Mustard greens (cooked, or boiled & drained) Parsley *serving size only =  cup Spinach (cooked, or boiled & drained) Swiss chard (cooked, or boiled & drained) Turnip greens (cooked, or boiled & drained)  Eat a consistent number of servings per week of foods MEDIUM-HIGH in Vitamin K (1 serving = 1 cup)  Asparagus (cooked, or boiled & drained) Broccoli (cooked, boiled & drained, or raw & chopped) Brussel sprouts (cooked, or boiled & drained) *serving size only =  cup Lettuce, raw (green leaf, endive, romaine) Spinach, raw Turnip greens, raw & chopped   These websites have more information on Coumadin (warfarin):  FailFactory.se; VeganReport.com.au;        PLEASE REMEMBER TO BRING ALL OF YOUR MEDICATIONS TO EACH OF YOUR FOLLOW-UP OFFICE VISITS.  PLEASE ATTEND ALL SCHEDULED FOLLOW-UP APPOINTMENTS.   Activity: Increase activity slowly as tolerated. You [place] shower, but no soaking baths (or swimming) for 1 week. No driving for 24 hours. No lifting over 5 lbs for 1 week. No sexual activity for 1 week.   You Shark Return to Work: in 1 week (if applicable)  Wound Care: You [place] wash cath site gently with soap and water. Keep cath site clean and dry. If you notice pain, swelling, bleeding or pus at your cath site, please call [phone].   RESUME TAKING YOUR COUMADIN AS YOU WERE PRIOR TO BEING ADMITTED TO THE HOSPITAL. YOU WILL HAVE A COUMADIN CHECK ON Monday 08/04/2015 at 12:15 [place].

## 2015-07-29 NOTE — Progress Notes (Signed)
Patient Name: [name] Date of Encounter: 07/29/2015  Principal Problem:   NSTEMI (non-ST elevated myocardial infarction) [facility]) Active Problems:   Coronary artery disease   Chronic atrial fibrillation (HCC)   Hypertension   COPD (chronic obstructive pulmonary disease) ([place])   Hyperlipidemia   Primary Cardiologist: Dr. [name] Patient Profile: 80 y.o. F w/ PMH of CAD (mild, nonobstructive by cath in 2006), HTN, chronic diastolic CHF, HLD, mild pulmonary HTN, COPD and chronic atrial fibrillation (on Coumadin) who presented to [facility] ED on 07/28/2015 for new-onset chest pain.  SUBJECTIVE: Reports still having episodes of sternal chest pain and shortness of breath with walking. No chest pain at rest. No complications concerning her cath.  OBJECTIVE Filed Vitals:   07/28/15 2330 07/28/15 2342 07/29/15 0335 07/29/15 0400  BP: 130/71  123/63   Pulse: 66  61   Temp:  98.3 F (36.8 C)  98.2 F (36.8 C)  TempSrc:  Oral  Oral  Resp: 17  20   Height:      Weight:    225 lb 14.4 oz (102.468 kg)  SpO2: 91%  92%     Intake/Output Summary (Last 24 hours) at 07/29/15 0813 Last data filed at 07/29/15 0618  Gross per 24 hour  Intake 743.15 ml  Output   1201 ml  Net -457.85 ml   Filed Weights   07/28/15 0734 07/28/15 1535 07/29/15 0400  Weight: 220 lb (99.791 kg) 231 lb 3.2 oz (104.872 kg) 225 lb 14.4 oz (102.468 kg)    PHYSICAL EXAM General: Well developed, well nourished, female in no acute distress. Head: Normocephalic, atraumatic.  Neck: Supple without bruits, JVD not elevated. Lungs:  Resp regular and unlabored, CTA without wheezing or rales Heart: RRR, S1, S2, no S3, S4, or murmur; no rub. Abdomen: Soft, non-tender, non-distended with normoactive bowel sounds. No hepatomegaly. No rebound/guarding. No obvious abdominal masses. Extremities: No clubbing, cyanosis, or edema. Distal pedal pulses are 2+ bilaterally. Right radial cath site without ecchymosis, tenderness, or  evidence of hematoma. Neuro: Alert and oriented X 3. Moves all extremities spontaneously. Psych: Normal affect.  LABS: CBC: Recent Labs  07/28/15 0757  WBC 8.3  HGB 11.8*  HCT 37.9  MCV 93.6  PLT 205   INR: Recent Labs  07/29/15 0405  INR [id number]*   Basic Metabolic Panel: Recent Labs  07/28/15 0757 07/29/15 0405  NA 138 137  K 4.1 4.5  CL 102 103  CO2 28 25  GLUCOSE 149* 136*  BUN 15 16  CREATININE 0.72 0.71  CALCIUM 9.6 9.7   Liver Function Tests: Recent Labs  07/28/15 0745  AST 25  ALT 20  ALKPHOS 72  BILITOT 0.5  PROT 7.8  ALBUMIN 3.7   Cardiac Enzymes: Recent Labs  07/28/15 1833 07/28/15 2130 07/29/15 0405  TROPONINI 1.54* 1.55* 1.18*    Recent Labs  07/28/15 0757 07/28/15 1048  TROPIPOC 0.06 0.75*   Fasting Lipid Panel: Recent Labs  07/29/15 0405  CHOL 144  HDL 49  LDLCALC 80  TRIG 73  CHOLHDL 2.9    TELE:  Atrial fibrillation with rate in 60's - 80's.   ECHO: New echo is pending.  CARDIAC CATHETERIZATION: 07/28/2015  Prox RCA to Mid RCA lesion, 15% stenosed.  Prox LAD lesion, 20% stenosed.  There is severe left ventricular systolic dysfunction.  1. Minor nonobstructive CAD 2. Severe left ventriclar dysfunction. Wall motion abnormality consistent with Takotsubo cardiomyopathy.  Plan: medical management.   Radiology/Studies: Dg Chest  2 View: 07/28/2015  CLINICAL DATA:  Epigastric pain for 1 week. Pain was worse this morning. EXAM: CHEST  2 VIEW COMPARISON:  11/04/2013 and chest CT 03/18/2014 FINDINGS: Prominent interstitial lung markings have slightly progressed since the previous examination but suspect there is a chronic component. Heart size is upper limits of normal but unchanged. Mild scarring at the lung apices. The trachea is midline. No large pleural effusions. Degenerative endplate changes in the thoracic spine. IMPRESSION: Prominent interstitial lung markings likely represent chronic changes but difficult to exclude  mild interstitial edema. No focal airspace disease. Electronically Signed   By: Markus Daft M.D.   On: 07/28/2015 08:09   Ct Angio Chest Pe W/cm &/or Wo Cm: 07/28/2015  CLINICAL DATA:  Shortness of breath, chest pain for 1 week. Pain worsening this morning. Weakness, nausea this morning. EXAM: CT ANGIOGRAPHY CHEST WITH CONTRAST TECHNIQUE: Multidetector CT imaging of the chest was performed using the standard protocol during bolus administration of intravenous contrast. Multiplanar CT image reconstructions and MIPs were obtained to evaluate the vascular anatomy. CONTRAST:  70mL OMNIPAQUE IOHEXOL 350 MG/ML SOLN COMPARISON:  Chest x-ray earlier today.  Chest CT 03/18/2014 FINDINGS: No filling defects in the pulmonary arteries to suggest pulmonary emboli. There are small bilateral pleural effusions. No confluent airspace opacities. There are areas of bronchial wall thickening and mucous plugging within the airways bilaterally. Mild interstitial prominence peripherally and in the apices and bases likely reflect chronic lung disease. No confluent opacities. There is cardiomegaly. Aortic and coronary artery calcifications. Ascending aorta mildly dilated at 4.3 cm, stable when measured in the same planes on prior study. No mediastinal, hilar, or axillary adenopathy. Chest wall soft tissues are unremarkable. Imaging into the upper abdomen shows no acute findings. Calcifications in the spleen compatible with old granulomatous disease. No acute bony abnormality or focal bone lesion. Review of the MIP images confirms the above findings. IMPRESSION: No evidence of pulmonary embolus. Small bilateral pleural effusions. Peribronchial thickening [place] reflect bronchitis. There is mucous plugging within several airways bilaterally. Cardiomegaly, coronary artery disease. 4.3 cm ascending thoracic aortic aneurysm, stable. Electronically Signed   By: Rolm Baptise M.D.   On: 07/28/2015 10:40     Current Medications:  . aspirin EC  81  mg Oral Daily  . furosemide  20 mg Oral Daily  . losartan  100 mg Oral Daily  . metoprolol succinate  50 mg Oral Daily  . simvastatin  10 mg Oral q1800  . sodium chloride  3 mL Intravenous Q12H  . Warfarin - Pharmacist Dosing Inpatient   Does not apply q1800   . heparin 1,100 Units/hr (07/29/15 0135)  . nitroGLYCERIN 20 mcg/min (07/29/15 0446)    ASSESSMENT AND PLAN: 1. NSTEMI - history of nonobstructive CAD by cath in 2006 with 20-30% stenosis in the distal left main, 20-30% ostial lesion in the left anterior descending, and no significant disease in the ramus, circumflex or RCA. - initially had chest pain occurring in setting of viral URI, however chest pain has persisted and is worse with activity. It is not reproducible with palpation. Associated with dyspnea on exertion. Had episode of pain that awoke her from sleep. Relieved with NTG in the ED.  - cyclic troponin values have been 1.54, 1.55, and 1.18. - after she continued to have severe chest pain with ambulating to the restroom and became diaphoretic, her EKG showed new intraventricular delay and she was taken urgently to the catheterization lab on 07/29/2015. Her cath showed 15% stenosis  in the Prox RCA to Mid RCA lesion and 20% stenosis in the Prox LAD lesion. Severe left ventriclar dysfunction was noted along with wall motion abnormality consistent with Takotsubo cardiomyopathy. - a new echocardiogram is pending. - Discontinue heparin. Restart Coumadin. Continue ASA, statin, BB, and ARB. On NTG drip currently (still having chest pain with activity). Consider switching to Imdur if chest pain persists.  2. Chronic Diastolic CHF - EF 0000000 in 2015. - does not appear to be in acute HF at this time. - will continue home Lasix dosage  3. HTN - continue ARB.  4. Chronic Atrial Fibrillation - on Coumadin PTA (INR sub-therapeutic at 1.68 at time of admission). Coumadin has been resumed. - continue Toprol-XL for rate control.  5.  HLD  - continue statin  6. COPD - "mild" according to the patient. Not on any home medications. - O2 saturation 88% on RA upon arrival to the ED. 96% on 4L [place].  7. Ascending Thoracic Aortic Aneurysm - 4.3 cm ascending thoracic aortic aneurysm by CT on 07/28/2015 - previously 4cm in 03/2014. - follow-up as outpatient  Signed, Erma Heritage , PA-C 8:13 AM 07/29/2015 Pager: [phone]  Patient seen, examined. Available data reviewed. Agree with findings, assessment, and plan as outlined by Bernerd Pho, PA-C. The patient is feeling much better today. She does have some mild residual chest discomfort, but is much more comfortable. Lung fields are clear. Heart is regular rate and rhythm without murmur. Extremities without edema. Cath study reviewed and clinical scenario diagnostic of Acute Takotsubo Syndrome. Continue beta-blockade, ACE. Follow-up echo today. Stop IV NTG. Anticipate DC home tomorrow if continued stability.  Sherren Mocha, M.D. 07/29/2015 10:53 AM

## 2015-07-29 NOTE — Progress Notes (Signed)
Rn notified by CMT that patient heart rate=39.  Patient was asymptomatic.  [place] notified.

## 2015-07-29 NOTE — Progress Notes (Signed)
  Received call from patient's nurse that she was still having chest pain with ambulation. We discontinued her NTG drip this morning. Will start Imdur 30mg  daily. BP has been elevated over the past 24 hours, so hopefully Imdur will help with her pain and BP.   Signed, Erma Heritage, PA-C 07/29/2015, 3:03 PM Pager: [phone]

## 2015-07-29 NOTE — Progress Notes (Signed)
Echocardiogram 2D Echocardiogram has been performed.  Tresa Res 07/29/2015, 12:15 PM

## 2015-07-30 ENCOUNTER — Encounter (HOSPITAL_COMMUNITY): Payer: Self-pay | Admitting: Student

## 2015-07-30 LAB — CBC
HCT: 37.4 % (ref 36.0–46.0)
Hemoglobin: 11.7 g/dL — ABNORMAL LOW (ref 12.0–15.0)
MCH: 29.5 pg (ref 26.0–34.0)
MCHC: 31.3 g/dL (ref 30.0–36.0)
MCV: 94.4 fL (ref 78.0–100.0)
PLATELETS: 205 10*3/uL (ref 150–400)
RBC: 3.96 MIL/uL (ref 3.87–5.11)
RDW: 14.5 % (ref 11.5–15.5)
WBC: 9.4 10*3/uL (ref 4.0–10.5)

## 2015-07-30 LAB — PROTIME-INR
INR: 2.09 — ABNORMAL HIGH (ref 0.00–1.49)
PROTHROMBIN TIME: 23.4 s — AB (ref 11.6–15.2)

## 2015-07-30 MED ORDER — ISOSORBIDE MONONITRATE ER 30 MG PO TB24: 30.0000 mg | ORAL_TABLET | Freq: Every day | ORAL | Status: DC

## 2015-07-30 NOTE — Progress Notes (Signed)
Patient Name: [name] Date of Encounter: 07/30/2015  Principal Problem:   NSTEMI (non-ST elevated myocardial infarction) [facility]) Active Problems:   Coronary artery disease   Chronic atrial fibrillation (HCC)   Hypertension   COPD (chronic obstructive pulmonary disease) ([place])   Hyperlipidemia   Takotsubo syndrome   Primary Cardiologist: Dr. [name] Patient Profile: 80 y.o. F w/ PMH of CAD (mild, nonobstructive by cath in 2006), HTN, chronic diastolic CHF, HLD, mild pulmonary HTN, COPD and chronic atrial fibrillation (on Coumadin) who presented to [facility] ED on 07/28/2015 for new-onset chest pain.  SUBJECTIVE: Feeling well. Reports significant improvement in her chest pain since yesterday.  OBJECTIVE Filed Vitals:   07/29/15 1657 07/29/15 2053 07/30/15 0021 07/30/15 0540  BP: 142/63 123/56 121/58 148/78  Pulse: 58 55 68 83  Temp: 97.5 F (36.4 C) 97.7 F (36.5 C) 98 F (36.7 C) 97.7 F (36.5 C)  TempSrc: Oral Oral Oral Oral  Resp:  18 18 18   Height:      Weight:    224 lb 4.8 oz (101.742 kg)  SpO2: 100% 97% 99% 97%    Intake/Output Summary (Last 24 hours) at 07/30/15 0733 Last data filed at 07/30/15 0321  Gross per 24 hour  Intake   1020 ml  Output   1000 ml  Net     20 ml   Filed Weights   07/28/15 1535 07/29/15 0400 07/30/15 0540  Weight: 231 lb 3.2 oz (104.872 kg) 225 lb 14.4 oz (102.468 kg) 224 lb 4.8 oz (101.742 kg)    PHYSICAL EXAM General: Well developed, well nourished, female in no acute distress. Head: Normocephalic, atraumatic.  Neck: Supple without bruits, JVD not elevated. Lungs:  Resp regular and unlabored, CTA without wheezing or rales Heart: Irregularly irregular, S1, S2, no S3, S4, or murmur; no rub. Abdomen: Soft, non-tender, non-distended with normoactive bowel sounds. No hepatomegaly. No rebound/guarding. No obvious abdominal masses. Extremities: No clubbing, cyanosis, or edema. Distal pedal pulses are 2+ bilaterally. Right radial  cath site without ecchymosis, tenderness, or evidence of hematoma. Neuro: Alert and oriented X 3. Moves all extremities spontaneously. Psych: Normal affect.  LABS: CBC:  Recent Labs  07/29/15 0405 07/30/15 0336  WBC 8.4 9.4  HGB 11.2* 11.7*  HCT 34.3* 37.4  MCV 92.7 94.4  PLT 213 205   INR:  Recent Labs  07/30/15 0336  INR [id number]*   Basic Metabolic Panel:  Recent Labs  07/28/15 0757 07/29/15 0405  NA 138 137  K 4.1 4.5  CL 102 103  CO2 28 25  GLUCOSE 149* 136*  BUN 15 16  CREATININE 0.72 0.71  CALCIUM 9.6 9.7   Liver Function Tests:  Recent Labs  07/28/15 0745  AST 25  ALT 20  ALKPHOS 72  BILITOT 0.5  PROT 7.8  ALBUMIN 3.7   Cardiac Enzymes:  Recent Labs  07/28/15 1833 07/28/15 2130 07/29/15 0405  TROPONINI 1.54* 1.55* 1.18*    Recent Labs  07/28/15 0757 07/28/15 1048  TROPIPOC 0.06 0.75*   Fasting Lipid Panel:  Recent Labs  07/29/15 0405  CHOL 144  HDL 49  LDLCALC 80  TRIG 73  CHOLHDL 2.9    TELE:  Atrial fibrillation with rate in 50's - 70's.   ECHO: 07/29/2015 Study Conclusions - Left ventricle: The cavity size was normal. Wall thickness was normal. Systolic function was moderately reduced. The estimated ejection fraction was in the range of 35% to 40%. Akinesis of the mid-apicalanteroseptal and apical  myocardium. - Aortic valve: There was mild stenosis. Valve area (VTI): 2.51 cm^2. Valve area (Vmax): 1.96 cm^2. Valve area (Vmean): 2.43 cm^2. - Right atrium: The atrium was mildly dilated. - Tricuspid valve: There was moderate regurgitation. - Pulmonary arteries: Systolic pressure was moderately increased. PA peak pressure: 45 mm Hg (S).  CARDIAC CATHETERIZATION: 07/28/2015  Prox RCA to Mid RCA lesion, 15% stenosed.  Prox LAD lesion, 20% stenosed.  There is severe left ventricular systolic dysfunction.  1. Minor nonobstructive CAD 2. Severe left ventriclar dysfunction. Wall motion abnormality  consistent with Takotsubo cardiomyopathy.  Plan: medical management.   Radiology/Studies: Dg Chest 2 View: 07/28/2015  CLINICAL DATA:  Epigastric pain for 1 week. Pain was worse this morning. EXAM: CHEST  2 VIEW COMPARISON:  11/04/2013 and chest CT 03/18/2014 FINDINGS: Prominent interstitial lung markings have slightly progressed since the previous examination but suspect there is a chronic component. Heart size is upper limits of normal but unchanged. Mild scarring at the lung apices. The trachea is midline. No large pleural effusions. Degenerative endplate changes in the thoracic spine. IMPRESSION: Prominent interstitial lung markings likely represent chronic changes but difficult to exclude mild interstitial edema. No focal airspace disease. Electronically Signed   By: Markus Daft M.D.   On: 07/28/2015 08:09   Ct Angio Chest Pe W/cm &/or Wo Cm: 07/28/2015  CLINICAL DATA:  Shortness of breath, chest pain for 1 week. Pain worsening this morning. Weakness, nausea this morning. EXAM: CT ANGIOGRAPHY CHEST WITH CONTRAST TECHNIQUE: Multidetector CT imaging of the chest was performed using the standard protocol during bolus administration of intravenous contrast. Multiplanar CT image reconstructions and MIPs were obtained to evaluate the vascular anatomy. CONTRAST:  77mL OMNIPAQUE IOHEXOL 350 MG/ML SOLN COMPARISON:  Chest x-ray earlier today.  Chest CT 03/18/2014 FINDINGS: No filling defects in the pulmonary arteries to suggest pulmonary emboli. There are small bilateral pleural effusions. No confluent airspace opacities. There are areas of bronchial wall thickening and mucous plugging within the airways bilaterally. Mild interstitial prominence peripherally and in the apices and bases likely reflect chronic lung disease. No confluent opacities. There is cardiomegaly. Aortic and coronary artery calcifications. Ascending aorta mildly dilated at 4.3 cm, stable when measured in the same planes on prior study. No  mediastinal, hilar, or axillary adenopathy. Chest wall soft tissues are unremarkable. Imaging into the upper abdomen shows no acute findings. Calcifications in the spleen compatible with old granulomatous disease. No acute bony abnormality or focal bone lesion. Review of the MIP images confirms the above findings. IMPRESSION: No evidence of pulmonary embolus. Small bilateral pleural effusions. Peribronchial thickening [place] reflect bronchitis. There is mucous plugging within several airways bilaterally. Cardiomegaly, coronary artery disease. 4.3 cm ascending thoracic aortic aneurysm, stable. Electronically Signed   By: Rolm Baptise M.D.   On: 07/28/2015 10:40     Current Medications:  . aspirin EC  81 mg Oral Daily  . furosemide  20 mg Oral Daily  . isosorbide mononitrate  30 mg Oral Daily  . losartan  100 mg Oral Daily  . metoprolol succinate  50 mg Oral Daily  . simvastatin  10 mg Oral q1800  . sodium chloride  3 mL Intravenous Q12H  . Warfarin - Pharmacist Dosing Inpatient   Does not apply q1800     ASSESSMENT AND PLAN:  1. NSTEMI - history of nonobstructive CAD by cath in 2006 with 20-30% stenosis in the distal left main, 20-30% ostial lesion in the left anterior descending, and no significant disease in  the ramus, circumflex or RCA. - initially had chest pain occurring in setting of viral URI, however chest pain has persisted and is worse with activity. It is not reproducible with palpation. Associated with dyspnea on exertion. Had episode of pain that awoke her from sleep. Relieved with NTG in the ED.  - cyclic troponin values have been 1.54, 1.55, and 1.18. - after she continued to have severe chest pain with ambulating to the restroom and became diaphoretic, her EKG showed new intraventricular delay and she was taken urgently to the catheterization lab on 07/29/2015. Her cath showed 15% stenosis in the Prox RCA to Mid RCA lesion and 20% stenosis in the Prox LAD lesion. Severe left  ventriclar dysfunction was noted along with wall motion abnormality consistent with Takotsubo cardiomyopathy. -  Echo on 07/29/2015 showed EF of 35% to 40% and akinesis of the mid-apicalanteroseptal and apical myocardium. - Coumadin has been restarted. Continue ASA, statin, BB, and ARB. Would not increase BB currently due to HR in 50's at times. NTG discontinued on 07/29/2015. Patient was still having chest pain with activity on 07/29/2015 so Imdur 30mg  daily was started.  2. Chronic Diastolic CHF - EF 0000000 in 2015. - EF 35-40% on 07/29/2015 - does not appear to be in acute HF at this time. - continue home Lasix dosage  3. HTN - BP has been 121/56 - 148/78 in the past 24 hours. - continue ARB.  4. Chronic Atrial Fibrillation - on Coumadin PTA (INR sub-therapeutic at 1.68 at time of admission). Coumadin has been resumed. INR now therapeutic at 2.09 on 07/30/2015. - continue Toprol-XL for rate control.  5. HLD  - continue statin  6. COPD - "mild" according to the patient. Not on any home medications. - O2 saturation 88% on RA upon arrival to the ED. 96% on 4L [place].  7. Ascending Thoracic Aortic Aneurysm - 4.3 cm ascending thoracic aortic aneurysm by CT on 07/28/2015 - previously 4cm in 03/2014. - follow-up as outpatient  Appears stable for discharge today. Will need repeat echo as outpatient.   Arna Medici , PA-C 7:33 AM 07/30/2015 Pager: [phone]  Patient seen, examined. Available data reviewed. Agree with findings, assessment, and plan as outlined by Bernerd Pho, PA-C.  On exam, the patient is alert, oriented, in no distress. Lung fields are clear. JVP is normal. Heart is regular rate and rhythm without murmur. There is no peripheral edema present. The patient's angina has resolved after addition of isosorbide.  LV function improving in the short interval between cath study and echo (24 hrs). Continue Toprol-XL. Counseled regarding good prognosis  generally associated with Takotsubo Syndrome. FU echo in 8-12 weeks at discretion of Dr [name].   Sherren Mocha, M.D. 07/30/2015 10:20 AM

## 2015-07-30 NOTE — Discharge Summary (Signed)
CARDIOLOGY DISCHARGE SUMMARY   Patient ID: [name] MRN: [id number] DOB/AGE: July 23, 1935 80 y.o.  Admit date: 07/28/2015 Discharge date: 07/30/2015  PCP: Karis Juba, PA-C Primary Cardiologist: Dr. [name]  Primary Discharge Diagnosis: NSTEMI (non-ST elevated myocardial infarction) [facility]) Secondary Discharge Diagnosis: Coronary artery disease, Chronic atrial fibrillation ([place]), Hypertension, COPD (chronic obstructive pulmonary disease) ([place]), Hyperlipidemia, Takotsubo syndrome   Consults: None  Procedures: Left Heart Catheterization, Coronary Angiography  Hospital Course: [name] is a 80 y.o. female with past medical history of CAD (mild, nonobstructive by cath in 2006), HTN, chronic diastolic CHF, HLD, mild pulmonary HTN, COPD and chronic atrial fibrillation (on Coumadin) who presented to [facility] ED on 07/28/2015 for new-onset chest pain.  She had been experiencing chest pain for a week accompanied by a dry cough and had been treated by her PCP with Azithromycin, yet experienced no relief in her chest pain symptoms. The pain was worse with activity and accompanied by dyspnea. The pain woke her from sleep during the early morning hours of 07/28/2015 which prompted her to come to the ED for further evaluation.  Her initial troponin was elevated at 0.75 and a LHC was going to be planned for the following day. She was started on Heparin and a NTG drip. However, while in the ED, she developed worsening chest pain with ambulation to the restroom and became extremely diaphoretic. A repeat EKG was obtained which showed new intraventricular conduction delay along with anterior TWI. She was added on to the cath board as the next available case that day.   The risks and benefits of the procedure were discussed with the patient and she agreed to proceed. Her cath showed minor nonobstructive CAD but severe left ventricular systolic dysfunction was noted along with wall motion  abnormalities consistent with Takotsubo cardiomyopathy. Medical management was recommended.  On 07/29/2015, she reported still having chest pain with ambulation. Her Heparin and NTG drip were discontinued and she was started on Imdur 30mg  daily. An echocardiogram was obtained which showed an EF of 35-40% with akinesis of the mid-apical anteroseptal and apical myocardium.   On 07/30/2015, she reported significant improvement in her chest pain. Her medications were further reviewed and it was decided not to increase her BB due to episodes of bradycardia into the 50's. She will be discharged on Imdur 30mg  daily and her PTA doses of Toprol-XL and Losartan.   The patient was last examined by Dr. Burt Knack and deemed stable for discharge. She has scheduled cardiology follow-up on 08/18/2015 and will need a repeat echocardiogram in 8-12 weeks. In regards to her Coumadin, she was initially sub-therapeutic at time of admission with an INR of 1.68. This had improved to 2.09 on 07/30/2015. She was instructed to resume Coumadin as she was taking prior to admission and will have an INR check on 08/04/2015 with her PCP. This appointment was scheduled prior to her leaving the hospital.   All questions and concerns were addressed prior to discharge. She is going home in stable condition.  Labs:   Lab Results  Component Value Date   WBC 9.4 07/30/2015   HGB 11.7* 07/30/2015   HCT 37.4 07/30/2015   MCV 94.4 07/30/2015   PLT 205 07/30/2015    Recent Labs Lab 07/28/15 0745  07/29/15 0405  NA  --   < > 137  K  --   < > 4.5  CL  --   < > 103  CO2  --   < >  25  BUN  --   < > 16  CREATININE  --   < > 0.71  CALCIUM  --   < > 9.7  PROT 7.8  --   --   BILITOT 0.5  --   --   ALKPHOS 72  --   --   ALT 20  --   --   AST 25  --   --   GLUCOSE  --   < > 136*  < > = values in this interval not displayed.  Recent Labs  07/28/15 1833 07/28/15 2130 07/29/15 0405  TROPONINI 1.54* 1.55* 1.18*   Lipid Panel       Component Value Date/Time   CHOL 144 07/29/2015 0405   TRIG 73 07/29/2015 0405   HDL 49 07/29/2015 0405   CHOLHDL 2.9 07/29/2015 0405   VLDL 15 07/29/2015 0405   LDLCALC 80 07/29/2015 0405    Recent Labs  07/30/15 0336  INR 2.09*      Radiology:  Dg Chest 2 View: 07/28/2015  CLINICAL DATA:  Epigastric pain for 1 week. Pain was worse this morning. EXAM: CHEST  2 VIEW COMPARISON:  11/04/2013 and chest CT 03/18/2014 FINDINGS: Prominent interstitial lung markings have slightly progressed since the previous examination but suspect there is a chronic component. Heart size is upper limits of normal but unchanged. Mild scarring at the lung apices. The trachea is midline. No large pleural effusions. Degenerative endplate changes in the thoracic spine. IMPRESSION: Prominent interstitial lung markings likely represent chronic changes but difficult to exclude mild interstitial edema. No focal airspace disease. Electronically Signed   By: Markus Daft M.D.   On: 07/28/2015 08:09   Ct Angio Chest Pe W/cm &/or Wo Cm: 07/28/2015  CLINICAL DATA:  Shortness of breath, chest pain for 1 week. Pain worsening this morning. Weakness, nausea this morning. EXAM: CT ANGIOGRAPHY CHEST WITH CONTRAST TECHNIQUE: Multidetector CT imaging of the chest was performed using the standard protocol during bolus administration of intravenous contrast. Multiplanar CT image reconstructions and MIPs were obtained to evaluate the vascular anatomy. CONTRAST:  59mL OMNIPAQUE IOHEXOL 350 MG/ML SOLN COMPARISON:  Chest x-ray earlier today.  Chest CT 03/18/2014 FINDINGS: No filling defects in the pulmonary arteries to suggest pulmonary emboli. There are small bilateral pleural effusions. No confluent airspace opacities. There are areas of bronchial wall thickening and mucous plugging within the airways bilaterally. Mild interstitial prominence peripherally and in the apices and bases likely reflect chronic lung disease. No confluent opacities.  There is cardiomegaly. Aortic and coronary artery calcifications. Ascending aorta mildly dilated at 4.3 cm, stable when measured in the same planes on prior study. No mediastinal, hilar, or axillary adenopathy. Chest wall soft tissues are unremarkable. Imaging into the upper abdomen shows no acute findings. Calcifications in the spleen compatible with old granulomatous disease. No acute bony abnormality or focal bone lesion. Review of the MIP images confirms the above findings. IMPRESSION: No evidence of pulmonary embolus. Small bilateral pleural effusions. Peribronchial thickening Soza reflect bronchitis. There is mucous plugging within several airways bilaterally. Cardiomegaly, coronary artery disease. 4.3 cm ascending thoracic aortic aneurysm, stable. Electronically Signed   By: Rolm Baptise M.D.   On: 07/28/2015 10:40    Cardiac Cath: 07/28/2015  Prox RCA to Mid RCA lesion, 15% stenosed.  Prox LAD lesion, 20% stenosed.  There is severe left ventricular systolic dysfunction.  1. Minor nonobstructive CAD 2. Severe left ventriclar dysfunction. Wall motion abnormality consistent with Takotsubo cardiomyopathy.  Plan: medical management.  Echo: 07/29/2015 Study Conclusions - Left ventricle: The cavity size was normal. Wall thickness was normal. Systolic function was moderately reduced. The estimated ejection fraction was in the range of 35% to 40%. Akinesis of the mid-apicalanteroseptal and apical myocardium. - Aortic valve: There was mild stenosis. Valve area (VTI): 2.51 cm^2. Valve area (Vmax): 1.96 cm^2. Valve area (Vmean): 2.43 cm^2. - Right atrium: The atrium was mildly dilated. - Tricuspid valve: There was moderate regurgitation. - Pulmonary arteries: Systolic pressure was moderately increased. PA peak pressure: 45 mm Hg (S).  FOLLOW UP PLANS AND APPOINTMENTS Allergies  Allergen Reactions  . Contrast Media [Iodinated Diagnostic Agents] Other (See Comments)    Per pt  strong burning sensation starting in chest radiating outward  . Statins Other (See Comments)    "bones hurt"  . Celebrex [Celecoxib] Rash  . Tape Itching and Rash    Red Where applied and will spread     Medication List    STOP taking these medications        azithromycin 250 MG tablet  Commonly known as:  ZITHROMAX      TAKE these medications        acetaminophen 325 MG tablet  Commonly known as:  TYLENOL  Take 650 mg by mouth every 6 (six) hours as needed for mild pain.     ALIGN 4 MG Caps  Take 1 capsule by mouth daily.     alum & mag hydroxide-simeth 200-200-20 MG/5ML suspension  Commonly known as:  MAALOX/MYLANTA  Take 30 mLs by mouth every 6 (six) hours as needed for indigestion or heartburn.     diphenhydrAMINE 25 MG tablet  Commonly known as:  BENADRYL  Take 25 mg by mouth every 8 (eight) hours as needed (Bee sting).     furosemide 20 MG tablet  Commonly known as:  LASIX  Take 1 tablet (20 mg total) by mouth daily.     guaiFENesin-codeine 100-10 MG/5ML syrup  Take 5 mLs by mouth every 6 (six) hours as needed.     isosorbide mononitrate 30 MG 24 hr tablet  Commonly known as:  IMDUR  Take 1 tablet (30 mg total) by mouth daily.     losartan 100 MG tablet  Commonly known as:  COZAAR  Take 1 tablet (100 mg total) by mouth daily.     metoprolol succinate 50 MG 24 hr tablet  Commonly known as:  TOPROL-XL  Take 1 tablet (50 mg total) by mouth daily. Take with or immediately following a meal.     nitroGLYCERIN 0.4 MG SL tablet  Commonly known as:  NITROSTAT  Place 1 tablet (0.4 mg total) under the tongue every 5 (five) minutes as needed for chest pain.     omeprazole 20 MG capsule  Commonly known as:  PRILOSEC  Take 20 mg by mouth daily as needed (heartburn, acid reflux).     OVER THE COUNTER MEDICATION  Place 1 drop into both eyes daily as needed (dry eyes). Eye drops     simvastatin 10 MG tablet  Commonly known as:  ZOCOR  Take 1 tablet (10 mg total)  by mouth every other day.     triamcinolone ointment 0.1 %  Commonly known as:  KENALOG     Vitamin D 2000 UNITS Caps  Take 1 capsule by mouth daily.     warfarin 5 MG tablet  Commonly known as:  COUMADIN  Take 0.5 tablets (2.5 mg total) by mouth See admin instructions. Take half a  tablet (2.5 mg) on Tuesdays, Thursdays, and Sundays. Take half a tablet with half a tablet of 1 mg (total 3 mg) on all other days.         Follow-up Information    Follow up with Ermalinda Barrios, PA-C On 08/18/2015.   Specialty:  Cardiology   Why:  Cardiology Hospital Follow-up on 08/18/2015 at 1:45PM.   Contact information:   [address] [place] [address] [phone]       Follow up with [facility], PA-C On 08/04/2015.   Specialty:  Physician Assistant   Why:  Follow-Up INR check on 08/04/2015 at 12:15PM   Contact information:   [place] [address] [phone]       BRING ALL MEDICATIONS WITH YOU TO FOLLOW UP APPOINTMENTS  Time spent with patient to include physician time: 40 minutes Signed: Erma Heritage, PA 07/30/2015, 11:56 AM Co-Sign MD

## 2015-07-30 NOTE — Care Management Note (Addendum)
Case Management Note  Patient Details  Name: [name] MRN: [id number] Date of Birth: Nov 05, 1934  Subjective/Objective:      Pt admitted with NSTEMI              Action/Plan:  Pt is independent from home with husband.  CM will continue to follow for disposition needs   Expected Discharge Date:                  Expected Discharge Plan:  Home/Self Care  In-House Referral:     Discharge planning Services  CM Consult  Post Acute Care Choice:    Choice offered to:     DME Arranged:    DME Agency:     HH Arranged:    [place] Agency:     Status of Service:  Complete, will sign off  Medicare Important Message Given:    Date Medicare IM Given:    Medicare IM give by:    Date Additional Medicare IM Given:    Additional Medicare Important Message give by:     If discussed at [place] of Stay Meetings, dates discussed:    Additional Comments:  Maryclare Labrador, RN 07/30/2015, 10:10 AM

## 2015-08-04 ENCOUNTER — Ambulatory Visit (INDEPENDENT_AMBULATORY_CARE_PROVIDER_SITE_OTHER): Payer: Medicare Other | Admitting: Physician Assistant

## 2015-08-04 ENCOUNTER — Encounter: Payer: Self-pay | Admitting: Physician Assistant

## 2015-08-04 VITALS — BP 142/78 | HR 68 | Temp 98.2°F | Resp 18 | Wt 226.0 lb

## 2015-08-04 DIAGNOSIS — I482 Chronic atrial fibrillation, unspecified: Secondary | ICD-10-CM

## 2015-08-04 DIAGNOSIS — Z7901 Long term (current) use of anticoagulants: Secondary | ICD-10-CM

## 2015-08-04 LAB — PT WITH INR/FINGERSTICK
INR FINGERSTICK: 2.3 — AB (ref 0.80–1.20)
PT FINGERSTICK: 27.6 s — AB (ref 10.4–12.5)

## 2015-08-04 NOTE — Progress Notes (Signed)
Patient ID: [name] MRN: [id number], DOB: [birth year], 80 y.o. Date of Encounter: 08/04/2015, 12:34 PM    Chief Complaint:  Chief Complaint  Patient presents with  . PT/INR    2.5 T-Th-Su   3mg  all other days     HPI: 80 y.o. year old white female presents for PT/INR check.  She states that she has been taking her Coumadin dosing the same as usual. No changes in the dosing.  He does not even mention her recent hospitalization but I had gotten messages in epic that she had been hospitalized. I asked her if the Coumadin had to be stopped for any procedures during the hospitalization and she was unaware that it had to be stopped etc. However she does show me her right wrist where they entered to do cardiac cath. It appears that she was discharged home on November 23. INR on that date was at 2.09. She confirms that she has been taking her Coumadin at the same prior dosing with no changes in dosing. She has no complaints or concerns today. Has had no bleeding.     Home Meds:   Outpatient Prescriptions Prior to Visit  Medication Sig Dispense Refill  . acetaminophen (TYLENOL) 325 MG tablet Take 650 mg by mouth every 6 (six) hours as needed for mild pain.    Marland Kitchen alum & mag hydroxide-simeth (MAALOX/MYLANTA) 200-200-20 MG/5ML suspension Take 30 mLs by mouth every 6 (six) hours as needed for indigestion or heartburn.    . Cholecalciferol (VITAMIN D) 2000 UNITS CAPS Take 1 capsule by mouth daily.    . diphenhydrAMINE (BENADRYL) 25 MG tablet Take 25 mg by mouth every 8 (eight) hours as needed (Bee sting).    . furosemide (LASIX) 20 MG tablet Take 1 tablet (20 mg total) by mouth daily. 90 tablet 1  . isosorbide mononitrate (IMDUR) 30 MG 24 hr tablet Take 1 tablet (30 mg total) by mouth daily. 30 tablet 6  . losartan (COZAAR) 100 MG tablet Take 1 tablet (100 mg total) by mouth daily. 90 tablet 3  . metoprolol succinate (TOPROL-XL) 50 MG 24 hr tablet Take 1 tablet (50 mg total) by mouth daily.  Take with or immediately following a meal. 90 tablet 3  . nitroGLYCERIN (NITROSTAT) 0.4 MG SL tablet Place 1 tablet (0.4 mg total) under the tongue every 5 (five) minutes as needed for chest pain. 25 tablet 1  . omeprazole (PRILOSEC) 20 MG capsule Take 20 mg by mouth daily as needed (heartburn, acid reflux).    Marland Kitchen OVER THE COUNTER MEDICATION Place 1 drop into both eyes daily as needed (dry eyes). Eye drops    . triamcinolone ointment (KENALOG) 0.1 %   0  . warfarin (COUMADIN) 5 MG tablet Take 0.5 tablets (2.5 mg total) by mouth See admin instructions. Take half a tablet (2.5 mg) on Tuesdays, Thursdays, and Sundays. Take half a tablet with half a tablet of 1 mg (total 3 mg) on all other days. (Patient taking differently: Take 2.5 mg by mouth See admin instructions. Take half a tablet (2.5 mg) on Tuesdays, Thursdays, and Sundays. Take half a tablet with half a tablet of 1 mg (total 3 mg) on all other days) 90 tablet 3  . Probiotic Product (ALIGN) 4 MG CAPS Take 1 capsule by mouth daily.    . simvastatin (ZOCOR) 10 MG tablet Take 1 tablet (10 mg total) by mouth every other day. (Patient not taking: Reported on 07/28/2015) 90 tablet 3  .  guaiFENesin-codeine 100-10 MG/5ML syrup Take 5 mLs by mouth every 6 (six) hours as needed. (Patient not taking: Reported on 08/04/2015) 180 mL 0   No facility-administered medications prior to visit.    Allergies:  Allergies  Allergen Reactions  . Contrast Media [Iodinated Diagnostic Agents] Other (See Comments)    Per pt strong burning sensation starting in chest radiating outward  . Statins Other (See Comments)    "bones hurt"  . Celebrex [Celecoxib] Rash  . Tape Itching and Rash    Red Where applied and will spread      Review of Systems: See HPI for pertinent ROS. All other ROS negative.    Physical Exam: Blood pressure 142/78, pulse 68, temperature 98.2 F (36.8 C), temperature source Oral, resp. rate 18, weight 226 lb (102.513 kg)., Body mass index  is 36.49 kg/(m^2). General:  Obese white female . Appears in no acute distress. Neck: Supple. No thyromegaly. No lymphadenopathy. Lungs: Clear bilaterally to auscultation without wheezes, rales, or rhonchi. Breathing is unlabored. Heart: Irregular rhythm. Msk:  Strength and tone normal for age. Extremities/Skin: Warm and dry.  Neuro: Alert and oriented X 3. Moves all extremities spontaneously. Gait is normal. CNII-XII grossly in tact. Psych:  Responds to questions appropriately with a normal affect.   Results for orders placed or performed in visit on 08/04/15  PT with INR/Fingerstick  Result Value Ref Range   PT, fingerstick 27.6 (H) 10.4 - 12.5 seconds   INR, fingerstick 2.3 (H) 0.80 - 1.20     ASSESSMENT AND PLAN:  80 y.o. year old female with  1. Long term current use of anticoagulant therapy - PT with INR/Fingerstick INR therapeutic. Continue current dosing. Recheck PT/INR 4 weeks. 2. Chronic atrial fibrillation (HCC) INR therapeutic. Continue current dosing. Recheck PT/INR 4 weeks. 3. Chronic anticoagulation INR therapeutic. Continue current dosing. Recheck PT/INR 4 weeks.  [address] [place], [place], [facility] 08/04/2015 12:34 PM

## 2015-08-09 ENCOUNTER — Other Ambulatory Visit: Payer: Self-pay | Admitting: Cardiology

## 2015-08-11 ENCOUNTER — Ambulatory Visit (INDEPENDENT_AMBULATORY_CARE_PROVIDER_SITE_OTHER): Payer: Medicare Other | Admitting: Family Medicine

## 2015-08-11 ENCOUNTER — Encounter: Payer: Self-pay | Admitting: Family Medicine

## 2015-08-11 VITALS — BP 140/76 | HR 88 | Temp 98.8°F | Resp 16 | Ht 67.0 in | Wt 229.0 lb

## 2015-08-11 DIAGNOSIS — R1011 Right upper quadrant pain: Secondary | ICD-10-CM

## 2015-08-11 DIAGNOSIS — J209 Acute bronchitis, unspecified: Secondary | ICD-10-CM | POA: Diagnosis not present

## 2015-08-11 DIAGNOSIS — M7989 Other specified soft tissue disorders: Secondary | ICD-10-CM | POA: Diagnosis not present

## 2015-08-11 LAB — CBC WITH DIFFERENTIAL/PLATELET
BASOS ABS: 0 10*3/uL (ref 0.0–0.1)
BASOS PCT: 0 % (ref 0–1)
Eosinophils Absolute: 0.1 10*3/uL (ref 0.0–0.7)
Eosinophils Relative: 2 % (ref 0–5)
HCT: 36.2 % (ref 36.0–46.0)
HEMOGLOBIN: 11.9 g/dL — AB (ref 12.0–15.0)
Lymphocytes Relative: 36 % (ref 12–46)
Lymphs Abs: 2.1 10*3/uL (ref 0.7–4.0)
MCH: 29.9 pg (ref 26.0–34.0)
MCHC: 32.9 g/dL (ref 30.0–36.0)
MCV: 91 fL (ref 78.0–100.0)
MONOS PCT: 13 % — AB (ref 3–12)
MPV: 9.7 fL (ref 8.6–12.4)
Monocytes Absolute: 0.8 10*3/uL (ref 0.1–1.0)
NEUTROS ABS: 2.8 10*3/uL (ref 1.7–7.7)
NEUTROS PCT: 49 % (ref 43–77)
Platelets: 212 10*3/uL (ref 150–400)
RBC: 3.98 MIL/uL (ref 3.87–5.11)
RDW: 15 % (ref 11.5–15.5)
WBC: 5.8 10*3/uL (ref 4.0–10.5)

## 2015-08-11 LAB — COMPREHENSIVE METABOLIC PANEL
ALK PHOS: 67 U/L (ref 33–130)
ALT: 15 U/L (ref 6–29)
AST: 19 U/L (ref 10–35)
Albumin: 3.9 g/dL (ref 3.6–5.1)
BUN: 10 mg/dL (ref 7–25)
CALCIUM: 9.1 mg/dL (ref 8.6–10.4)
CO2: 31 mmol/L (ref 20–31)
Chloride: 106 mmol/L (ref 98–110)
Creat: 0.74 mg/dL (ref 0.60–0.93)
GLUCOSE: 96 mg/dL (ref 70–99)
POTASSIUM: 4.6 mmol/L (ref 3.5–5.3)
SODIUM: 139 mmol/L (ref 135–146)
Total Bilirubin: 0.7 mg/dL (ref 0.2–1.2)
Total Protein: 6.6 g/dL (ref 6.1–8.1)

## 2015-08-11 LAB — LIPASE: Lipase: 37 U/L (ref 7–60)

## 2015-08-11 MED ORDER — DEXLANSOPRAZOLE 60 MG PO CPDR: 60.0000 mg | DELAYED_RELEASE_CAPSULE | Freq: Every day | ORAL | Status: DC

## 2015-08-11 MED ORDER — LEVOFLOXACIN 500 MG PO TABS: 500.0000 mg | ORAL_TABLET | Freq: Every day | ORAL | Status: DC

## 2015-08-11 MED ORDER — PREDNISONE 20 MG PO TABS: 20.0000 mg | ORAL_TABLET | Freq: Every day | ORAL | Status: DC

## 2015-08-11 NOTE — Patient Instructions (Signed)
Take the prednisone for 5 days  Take antibiotics  Take the dexilant as prescribed  Lasix 2 tablets for 3 days, for 3 days I will call with lab results CT scan of stomach to be done  F/U pending results

## 2015-08-11 NOTE — Progress Notes (Signed)
Patient ID: [name], female   DOB: 1935-06-29, 80 y.o.   MRN: [id number]   Subjective:    Patient ID: [name], female    DOB: 21-Apr-1935, 80 y.o.   MRN: [id number]  Patient presents for Abd Pain and Cough  patient here with persistent abdominal pain and cough. She states that her cough and wheezing are worse at night. When she meets wheezes and makes her a little short of breath She was actually admitted for in N-STEMI last month. She had CT of chest done which did not show any acute lung pathology. Since she's been discharged from the hospital she's been coughing more which is more productive at times. She has not had any fever. She is not sure if the cough is making her right side hurt or if it is due to her chronic stomach problems. There's been no change in her bowels.  No change with eating. I did note that her weight is up 4 pounds in the past few weeks that she is on Lasix 20 mg. She has not taken anything for the acid reflux once again. She's not had any nausea vomiting either. She denies any chest pain.  She has had cholecystectomy  Review Of Systems:  GEN- denies fatigue, fever, weight loss,weakness, recent illness HEENT- denies eye drainage, change in vision, nasal discharge, CVS- denies chest pain, palpitations RESP-+SOB, +cough,+ wheeze ABD- denies N/V, change in stools, +abd pain GU- denies dysuria, hematuria, dribbling, incontinence MSK- denies joint pain, muscle aches, injury Neuro- denies headache, dizziness, syncope, seizure activity       Objective:    BP 140/76 mmHg  Pulse 88  Temp(Src) 98.8 F (37.1 C) (Oral)  Resp 16  Ht 5\' 7"  (1.702 m)  Wt 229 lb (103.874 kg)  BMI 35.86 kg/m2  SpO2 97% GEN- NAD, alert and oriented x3, not ill appearing  HEENT- PERRL, EOMI, non injected sclera, pink conjunctiva, MMM, oropharynx clear, TM clear no effusion, canals clear, no sinus tendernes, nares clear  Neck- Supple, no LAD CVS- RRR, no murmur RESP-mild upper airway  congestion, no wheeze, normal WOB ABD-NABS,soft,mild TTP epigastric region/RUQ ,ND EXT-pedal edema Pulses- Radial  2+      Assessment & Plan:      Problem List Items Addressed This Visit    None    Visit Diagnoses    RUQ pain    -  Primary    I think this is likley gastritis or her GERD untreated. Given Dexilant from office. Gallbladder out, check labs    Relevant Orders    Comprehensive metabolic panel    CBC with Differential/Platelet    Lipase    Acute bronchitis, unspecified organism        Treat for bronchitis, but recent hosptal admission so add Levaquin, prednisone, she has cough syrup, recent imaging       Note: This dictation was prepared with Dragon dictation along with smaller phrase technology. Any transcriptional errors that result from this process are unintentional.

## 2015-08-11 NOTE — Assessment & Plan Note (Signed)
Increase lasix to 40mg  for next 3 days to help with fluid retention, no sign of pulmonary edema on exam

## 2015-08-18 ENCOUNTER — Ambulatory Visit (INDEPENDENT_AMBULATORY_CARE_PROVIDER_SITE_OTHER): Payer: Medicare Other | Admitting: Physician Assistant

## 2015-08-18 ENCOUNTER — Encounter: Payer: Self-pay | Admitting: Physician Assistant

## 2015-08-18 VITALS — BP 150/60 | HR 68 | Ht 67.0 in | Wt 221.0 lb

## 2015-08-18 DIAGNOSIS — I214 Non-ST elevation (NSTEMI) myocardial infarction: Secondary | ICD-10-CM

## 2015-08-18 DIAGNOSIS — I5181 Takotsubo syndrome: Secondary | ICD-10-CM

## 2015-08-18 DIAGNOSIS — I255 Ischemic cardiomyopathy: Secondary | ICD-10-CM

## 2015-08-18 DIAGNOSIS — I2510 Atherosclerotic heart disease of native coronary artery without angina pectoris: Secondary | ICD-10-CM | POA: Diagnosis not present

## 2015-08-18 DIAGNOSIS — I482 Chronic atrial fibrillation, unspecified: Secondary | ICD-10-CM

## 2015-08-18 DIAGNOSIS — I2583 Coronary atherosclerosis due to lipid rich plaque: Secondary | ICD-10-CM

## 2015-08-18 DIAGNOSIS — I10 Essential (primary) hypertension: Secondary | ICD-10-CM | POA: Diagnosis not present

## 2015-08-18 MED ORDER — ISOSORBIDE MONONITRATE ER 60 MG PO TB24: 60.0000 mg | ORAL_TABLET | Freq: Every day | ORAL | Status: DC

## 2015-08-18 NOTE — Assessment & Plan Note (Signed)
Rate controlled on metoprolol and Coumadin

## 2015-08-18 NOTE — Assessment & Plan Note (Signed)
Patient continues to have exertional chest pain since she's been home from the hospital. Will increase him nor to 60 mg daily. If this does not work she Worster need to be started on Ranexa. Follow-up in 2 weeks. She is to call she continues to have trouble. Follow-up with Dr. [name] in one month.

## 2015-08-18 NOTE — Progress Notes (Signed)
Cardiology Office Note   Date:  08/18/2015   ID:  [name], DOB August 04, 1935, MRN [id number]  PCP:  Karis Juba, PA-C  Cardiologist:  Dr. [name]  Chief Complaint: chest pain    History of Present Illness: [name] is a 80 y.o. female who presents for follow up of recent hospitalization. She suffered a NSTEMI 07/28/15. Cardiac cath was done and showed minor nonobstructive CAD with severe LV systolic dysfunction consistent with Takotsubo cardiomyopathy. Follow-up 2-D echo EF 35-40% with akinesis of the mid apical anterior septal and apical myocardium. She also has chronic atrial fibrillation on Coumadin, chronic diastolic heart failure, mild pulmonary hypertension, COPD and hypertension.  Patient comes in today still complaining of exertional chest tightness. She had some in the hospital and Imdur was added. She says she gets tightness with very little activity. She was started on steroidal [place] biotic for wheezing and shortness of breath which has improved.     Past Medical History  Diagnosis Date  . Cancer [facility])     right colon and left breast  . Breast cancer ([place]) 01/06/2012  . Colon cancer ([place]) 01/06/2012  . Hernia   . GERD (gastroesophageal reflux disease)   . Hypertension   . Allergy     rhinitis  . Osteopenia   . Diverticulosis   . Pneumonia     hx child  . Hiatal hernia     denies  . Arthritis   . Coronary artery disease 2006    nonobstructive with 20-30% ostial LAD and LM  . Atrial fibrillation     chronic atrial fibrillation  . Edema extremities   . Hyperlipidemia   . COPD (chronic obstructive pulmonary disease) ([place])   . Dilated aortic root ([place])   . Pulmonary HTN (HCC)     moderate PASP 17mmHg  . Takotsubo syndrome 07/29/2015    a. EF 35-40% by echo; akinesis of mid-apical anteroseptal and apical myocardium    Past Surgical History  Procedure Laterality Date  . Colectomy      right side  . Mastectomy partial / lumpectomy  2008    left breast  .  Cardiac catheterization    . Breast surgery      lumpectomy left  . Eye surgery Bilateral 12    cataracts  . Appendectomy    . Cholecystectomy    . Excision of accessory nipple Bilateral 05/30/2013    Procedure: BILATERAL NIPPLE BIOPSY;  Surgeon: Merrie Roof, MD;  Location: [place];  Service: General;  Laterality: Bilateral;  . Cardiac catheterization N/A 07/28/2015    Procedure: Left Heart Cath and Coronary Angiography;  Surgeon: Peter M Martinique, MD;  Location: [place] CV LAB;  Service: Cardiovascular;  Laterality: N/A;     Current Outpatient Prescriptions  Medication Sig Dispense Refill  . acetaminophen (TYLENOL) 325 MG tablet Take 650 mg by mouth every 6 (six) hours as needed for mild pain.    Marland Kitchen alum & mag hydroxide-simeth (MAALOX/MYLANTA) 200-200-20 MG/5ML suspension Take 30 mLs by mouth every 6 (six) hours as needed for indigestion or heartburn.    . Cholecalciferol (VITAMIN D) 2000 UNITS CAPS Take 1 capsule by mouth daily.    Marland Kitchen dexlansoprazole (DEXILANT) 60 MG capsule Take 1 capsule (60 mg total) by mouth daily. 30 capsule 0  . diphenhydrAMINE (BENADRYL) 25 MG tablet Take 25 mg by mouth every 8 (eight) hours as needed (Bee sting).    . furosemide (LASIX) 20 MG tablet Take 1 tablet (20  mg total) by mouth daily. 90 tablet 1  . isosorbide mononitrate (IMDUR) 30 MG 24 hr tablet Take 1 tablet (30 mg total) by mouth daily. 30 tablet 6  . levofloxacin (LEVAQUIN) 500 MG tablet Take 1 tablet (500 mg total) by mouth daily. 7 tablet 0  . losartan (COZAAR) 100 MG tablet Take 1 tablet (100 mg total) by mouth daily. 90 tablet 3  . metoprolol succinate (TOPROL-XL) 50 MG 24 hr tablet Take 1 tablet (50 mg total) by mouth daily. Take with or immediately following a meal. 90 tablet 3  . nitroGLYCERIN (NITROSTAT) 0.4 MG SL tablet Place 1 tablet (0.4 mg total) under the tongue every 5 (five) minutes as needed for chest pain. 25 tablet 1  . omeprazole (PRILOSEC) 20 MG capsule Take 20 mg by mouth daily  as needed (heartburn, acid reflux).    Marland Kitchen OVER THE COUNTER MEDICATION Place 1 drop into both eyes daily as needed (dry eyes). Eye drops    . predniSONE (DELTASONE) 20 MG tablet Take 1 tablet (20 mg total) by mouth daily with breakfast. 5 tablet 0  . Probiotic Product (ALIGN) 4 MG CAPS Take 1 capsule by mouth daily.    . simvastatin (ZOCOR) 10 MG tablet Take 1 tablet (10 mg total) by mouth every other day. 90 tablet 3  . triamcinolone ointment (KENALOG) 0.1 %   0  . warfarin (COUMADIN) 1 MG tablet Take 0.5 mg by mouth as directed. M-W-F-SA  0  . warfarin (COUMADIN) 5 MG tablet Take 0.5 tablets (2.5 mg total) by mouth See admin instructions. Take half a tablet (2.5 mg) on Tuesdays, Thursdays, and Sundays. Take half a tablet with half a tablet of 1 mg (total 3 mg) on all other days. (Patient taking differently: Take 2.5 mg by mouth See admin instructions. Take half a tablet (2.5 mg) on Tuesdays, Thursdays, and Sundays. Take half a tablet with half a tablet of 1 mg (total 3 mg) on all other days) 90 tablet 3   No current facility-administered medications for this visit.    Allergies:   Contrast media; Statins; Celebrex; and Tape    Social History:  The patient  reports that she has never smoked. She has never used smokeless tobacco. She reports that she does not drink alcohol or use illicit drugs.   Family History:  The patient's family history includes Cancer in her sister and sister; Heart attack in her mother; Heart disease in her mother; Heart disease (age of onset: 48) in her brother; Hypertension in her father. There is no history of Stroke.    ROS:  Please see the history of present illness.   Otherwise, review of systems are positive for trouble sleeping which she's had even before the hospital.   All other systems are reviewed and negative.    PHYSICAL EXAM: VS:  BP 150/60 mmHg  Pulse 68  Ht 5\' 7"  (1.702 m)  Wt 221 lb (100.245 kg)  BMI 34.61 kg/m2 , BMI Body mass index is 34.61  kg/(m^2). GEN: Obese, in no acute distress Neck: no JVD, HJR, carotid bruits, or masses Cardiac:  RRR; no murmurs,gallop, rubs, thrill or heave,  Respiratory:  clear to auscultation bilaterally, normal work of breathing GI: soft, nontender, nondistended, + BS MS: no deformity or atrophy Extremities: Right arm without hematoma or hemorrhage at cath site good radial and brachial pulses, lower extremities without cyanosis, clubbing, edema, good distal pulses bilaterally.  Skin: warm and dry, no rash Neuro:  Strength and  sensation are intact    EKG:  EKG is ordered today. The ekg ordered today demonstrates  atrial fibrillation with deep T wave inversion anterior lateral, unchanged from EKG in the hospital  Recent Labs: 08/11/2015: ALT 15; BUN 10; Creat 0.74; Hemoglobin 11.9*; Platelets 212; Potassium 4.6; Sodium 139    Lipid Panel    Component Value Date/Time   CHOL 144 07/29/2015 0405   TRIG 73 07/29/2015 0405   HDL 49 07/29/2015 0405   CHOLHDL 2.9 07/29/2015 0405   VLDL 15 07/29/2015 0405   LDLCALC 80 07/29/2015 0405      Wt Readings from Last 3 Encounters:  08/18/15 221 lb (100.245 kg)  08/11/15 229 lb (103.874 kg)  08/04/15 226 lb (102.513 kg)      Other studies Reviewed: Additional studies/ records that were reviewed today include and review of the records demonstrates:   Cardiac Cath: 07/28/2015  Prox RCA to Mid RCA lesion, 15% stenosed.  Prox LAD lesion, 20% stenosed.  There is severe left ventricular systolic dysfunction.   1. Minor nonobstructive CAD 2. Severe left ventriclar dysfunction. Wall motion abnormality consistent with Takotsubo cardiomyopathy.  Plan: medical management.   Echo: 07/29/2015 Study Conclusions - Left ventricle: The cavity size was normal. Wall thickness was   normal. Systolic function was moderately reduced. The estimated   ejection fraction was in the range of 35% to 40%. Akinesis of the   mid-apicalanteroseptal and apical  myocardium. - Aortic valve: There was mild stenosis. Valve area (VTI): 2.51   cm^2. Valve area (Vmax): 1.96 cm^2. Valve area (Vmean): 2.43   cm^2. - Right atrium: The atrium was mildly dilated. - Tricuspid valve: There was moderate regurgitation. - Pulmonary arteries: Systolic pressure was moderately increased.   PA peak pressure: 45 mm Hg (S).    ASSESSMENT AND PLAN: Takotsubo syndrome Patient continues to have exertional chest pain since she's been home from the hospital. Will increase him nor to 60 mg daily. If this does not work she Mcmullen need to be started on Ranexa. Follow-up in 2 weeks. She is to call she continues to have trouble. Follow-up with Dr. [name] in one month.  NSTEMI (non-ST elevated myocardial infarction) ([place]) Nonobstructive CAD on cath EF 35-40%. Will need follow-up echo in 3 months  Hypertension Blood pressure is high today. Increase Imdur  Chronic atrial fibrillation (HCC) Rate controlled on metoprolol and Coumadin     Signed, Ermalinda Barrios, PA-C  08/18/2015 1:38 PM    [facility] [address], [place], [place]  [address] Phone: [phone]; Fax: [phone]

## 2015-08-18 NOTE — Assessment & Plan Note (Addendum)
Nonobstructive CAD on cath EF 35-40%. Will need follow-up echo in 3 months

## 2015-08-18 NOTE — Patient Instructions (Signed)
Medication Instructions:  Your physician has recommended you make the following change in your medication:  1.  INCREASE the Imdur to 60 mg taking 1 daily   Labwork: None oredered  Testing/Procedures: IN 3 MONTHS:  Your physician has requested that you have an echocardiogram. Echocardiography is a painless test that uses sound waves to create images of your heart. It provides your doctor with information about the size and shape of your heart and how well your heart's chambers and valves are working. This procedure takes approximately one hour. There are no restrictions for this procedure.    Follow-Up: Your physician recommends that you schedule a follow-up appointment in: 2 WEEKS WITH DR. [name] OR 1ST AVAILABLE APP (IF CAN'T SEE DR. [name] IN 2 WEEKS, THEN 1 MONTH AFTER SEES APP)   Any Other Special Instructions Will Be Listed Below (If Applicable).  Echocardiogram An echocardiogram, or echocardiography, uses sound waves (ultrasound) to produce an image of your heart. The echocardiogram is simple, painless, obtained within a short period of time, and offers valuable information to your health care provider. The images from an echocardiogram can provide information such as:  Evidence of coronary artery disease (CAD).  Heart size.  Heart muscle function.  Heart valve function.  Aneurysm detection.  Evidence of a past heart attack.  Fluid buildup around the heart.  Heart muscle thickening.  Assess heart valve function. LET [facility] CARE PROVIDER KNOW ABOUT:  Any allergies you have.  All medicines you are taking, including vitamins, herbs, eye drops, creams, and over-the-counter medicines.  Previous problems you or members of your family have had with the use of anesthetics.  Any blood disorders you have.  Previous surgeries you have had.  Medical conditions you have.  Possibility of pregnancy, if this applies. BEFORE THE PROCEDURE  No special preparation is  needed. Eat and drink normally.  PROCEDURE   In order to produce an image of your heart, gel will be applied to your chest and a wand-like tool (transducer) will be moved over your chest. The gel will help transmit the sound waves from the transducer. The sound waves will harmlessly bounce off your heart to allow the heart images to be captured in real-time motion. These images will then be recorded.  You [place] need an IV to receive a medicine that improves the quality of the pictures. AFTER THE PROCEDURE You Suell return to your normal schedule including diet, activities, and medicines, unless your health care provider tells you otherwise.   This information is not intended to replace advice given to you by your health care provider. Make sure you discuss any questions you have with your health care provider.   Document Released: 08/20/2000 Document Revised: 09/13/2014 Document Reviewed: 04/30/2013 Elsevier Interactive Patient Education [employer].    If you need a refill on your cardiac medications before your next appointment, please call your pharmacy.

## 2015-08-18 NOTE — Assessment & Plan Note (Signed)
Blood pressure is high today. Increase Imdur

## 2015-09-05 ENCOUNTER — Encounter: Payer: Self-pay | Admitting: Cardiology

## 2015-09-05 ENCOUNTER — Ambulatory Visit (INDEPENDENT_AMBULATORY_CARE_PROVIDER_SITE_OTHER): Payer: Medicare Other | Admitting: Cardiology

## 2015-09-05 VITALS — BP 130/64 | HR 60 | Ht 67.0 in | Wt 227.4 lb

## 2015-09-05 DIAGNOSIS — I5181 Takotsubo syndrome: Secondary | ICD-10-CM

## 2015-09-05 NOTE — Patient Instructions (Signed)
Medication Instructions:  Your physician has recommended you make the following change in your medication:  1. Taper imdur ( 30 mg ) two days, than ( 15 mg ) two day   Labwork: -None  Testing/Procedures: -None  Follow-Up: Your physician recommends that you keep your scheduled  follow-up appointment with Dr. [name]   Any Other Special Instructions Will Be Listed Below (If Applicable).  If blood pressure remains > 145 call our office, Monitor blood pressure at home  Low-Sodium Eating Plan Sodium raises blood pressure and causes water to be held in the body. Getting less sodium from food will help lower your blood pressure, reduce any swelling, and protect your heart, liver, and kidneys. We get sodium by adding salt (sodium chloride) to food. Most of our sodium comes from canned, boxed, and frozen foods. Restaurant foods, fast foods, and pizza are also very high in sodium. Even if you take medicine to lower your blood pressure or to reduce fluid in your body, getting less sodium from your food is important. WHAT IS MY PLAN? Most people should limit their sodium intake to 2,300 mg a day. Your health care provider recommends that you limit your sodium intake to ___2 gram_______ a day.  WHAT DO I NEED TO KNOW ABOUT THIS EATING PLAN? For the low-sodium eating plan, you will follow these general guidelines:  Choose foods with a % Daily Value for sodium of less than 5% (as listed on the food label).   Use salt-free seasonings or herbs instead of table salt or sea salt.   Check with your health care provider or pharmacist before using salt substitutes.   Eat fresh foods.  Eat more vegetables and fruits.  Limit canned vegetables. If you do use them, rinse them well to decrease the sodium.   Limit cheese to 1 oz (28 g) per day.   Eat lower-sodium products, often labeled as "lower sodium" or "no salt added."  Avoid foods that contain monosodium glutamate (MSG). MSG is sometimes  added to [place] food and some canned foods.  Check food labels (Nutrition Facts labels) on foods to learn how much sodium is in one serving.  Eat more home-cooked food and less restaurant, buffet, and fast food.  When eating at a restaurant, ask that your food be prepared with less salt, or no salt if possible.  HOW DO I READ FOOD LABELS FOR SODIUM INFORMATION? The Nutrition Facts label lists the amount of sodium in one serving of the food. If you eat more than one serving, you must multiply the listed amount of sodium by the number of servings. Food labels [place] also identify foods as:  Sodium free--Less than 5 mg in a serving.  Very low sodium--35 mg or less in a serving.  Low sodium--140 mg or less in a serving.  Light in sodium--50% less sodium in a serving. For example, if a food that usually has 300 mg of sodium is changed to become light in sodium, it will have 150 mg of sodium.  Reduced sodium--25% less sodium in a serving. For example, if a food that usually has 400 mg of sodium is changed to reduced sodium, it will have 300 mg of sodium. WHAT FOODS CAN I EAT? Grains Low-sodium cereals, including oats, puffed wheat and rice, and shredded wheat cereals. Low-sodium crackers. Unsalted rice and pasta. Lower-sodium bread.  Vegetables Frozen or fresh vegetables. Low-sodium or reduced-sodium canned vegetables. Low-sodium or reduced-sodium tomato sauce and paste. Low-sodium or reduced-sodium tomato and vegetable juices.  Fruits Fresh, frozen, and canned fruit. Fruit juice.  Meat and Other Protein Products Low-sodium canned tuna and salmon. Fresh or frozen meat, poultry, seafood, and fish. Lamb. Unsalted nuts. Dried beans, peas, and lentils without added salt. Unsalted canned beans. Homemade soups without salt. Eggs.  Dairy Milk. Soy milk. Ricotta cheese. Low-sodium or reduced-sodium cheeses. Yogurt.  Condiments Fresh and dried herbs and spices. Salt-free seasonings. Onion  and garlic powders. Low-sodium varieties of mustard and ketchup. Fresh or refrigerated horseradish. Lemon juice.  Fats and Oils Reduced-sodium salad dressings. Unsalted butter.  Other Unsalted popcorn and pretzels.  The items listed above [place] not be a complete list of recommended foods or beverages. Contact your dietitian for more options. WHAT FOODS ARE NOT RECOMMENDED? Grains Instant hot cereals. Bread stuffing, pancake, and biscuit mixes. Croutons. Seasoned rice or pasta mixes. Noodle soup cups. Boxed or frozen macaroni and cheese. Self-rising flour. Regular salted crackers. Vegetables Regular canned vegetables. Regular canned tomato sauce and paste. Regular tomato and vegetable juices. Frozen vegetables in sauces. Salted [place] fries. Olives. Angie Fava. Relishes. Sauerkraut. Salsa. Meat and Other Protein Products Salted, canned, smoked, spiced, or pickled meats, seafood, or fish. Bacon, ham, sausage, hot dogs, corned beef, chipped beef, and packaged luncheon meats. Salt pork. Jerky. Pickled herring. Anchovies, regular canned tuna, and sardines. Salted nuts. Dairy Processed cheese and cheese spreads. Cheese curds. Blue cheese and cottage cheese. Buttermilk.  Condiments Onion and garlic salt, seasoned salt, table salt, and sea salt. Canned and packaged gravies. Worcestershire sauce. Tartar sauce. Barbecue sauce. Teriyaki sauce. Soy sauce, including reduced sodium. Steak sauce. Fish sauce. Oyster sauce. Cocktail sauce. Horseradish that you find on the shelf. Regular ketchup and mustard. Meat flavorings and tenderizers. Bouillon cubes. Hot sauce. Tabasco sauce. Marinades. Taco seasonings. Relishes. Fats and Oils Regular salad dressings. Salted butter. Margarine. Ghee. Bacon fat.  Other Potato and tortilla chips. Corn chips and puffs. Salted popcorn and pretzels. Canned or dried soups. Pizza. Frozen entrees and pot pies.  The items listed above [place] not be a complete list of foods and  beverages to avoid. Contact your dietitian for more information.   This information is not intended to replace advice given to you by your health care provider. Make sure you discuss any questions you have with your health care provider.   Document Released: 02/12/2002 Document Revised: 09/13/2014 Document Reviewed: 06/27/2013 Elsevier Interactive Patient Education [employer].    If you need a refill on your cardiac medications before your next appointment, please call your pharmacy.

## 2015-09-05 NOTE — Progress Notes (Signed)
09/05/2015 [name]   1935/03/22  [id number]  Primary Physician Karis Juba, PA-C Primary Cardiologist: [name]  Reason for Visit/CC: F/u for Medication Monitoring/ Takotsubo Cardiomyopathy  HPI:  The patient is a 80 year old female, followed by Dr. [name], who presents to clinic today for follow-up. She suffered a NSTEMI 07/28/15. Cardiac cath was done and showed minor nonobstructive CAD with severe LV systolic dysfunction consistent with Takotsubo cardiomyopathy. Follow-up 2-D echo EF 35-40% with akinesis of the mid apical anterior septal and apical myocardium. She also has chronic atrial fibrillation on Coumadin, chronic diastolic heart failure, mild pulmonary hypertension, COPD and hypertension.  She was seen by Ermalinda Barrios, PA-C, for post hospital follow-up on 08/18/2015.  She complained of continued exertional chest tightness. This was also present during her hospitalization and Imdur was added at time of discharge. Ms. Bonnell Public decided to increase her Imdur further to 60 mg. She was instructed to follow-up in 2 weeks for repeat evaluation.   She now presents to clinic for follow-up for medication monitoring. She reports adverse reactions with the Imdur. She notes a rash on her back which she feels is associated with use of Imdur. She reports that she was on Imdur several years ago and the same rash occurred.  She feels strongly the recurrence is from its use. She wishes to discontinue this medication if possible. She denies any further chest discomfort. No dyspnea. No lower extremity edema, orthopnea or PND. She reports full medication compliance. She is asymptomatic with her atrial fibrillation. She reports full compliance with Coumadin. Her INRs are followed by her PCP and have been stable. She denies any abnormal bleeding.     Current Outpatient Prescriptions  Medication Sig Dispense Refill  . acetaminophen (TYLENOL) 325 MG tablet Take 650 mg by mouth every 6 (six) hours as needed  for mild pain.    Marland Kitchen alum & mag hydroxide-simeth (MAALOX/MYLANTA) 200-200-20 MG/5ML suspension Take 30 mLs by mouth every 6 (six) hours as needed for indigestion or heartburn.    . Cholecalciferol (VITAMIN D) 2000 UNITS CAPS Take 1 capsule by mouth daily.    . diphenhydrAMINE (BENADRYL) 25 MG tablet Take 25 mg by mouth every 8 (eight) hours as needed (Bee sting).    . furosemide (LASIX) 20 MG tablet Take 1 tablet (20 mg total) by mouth daily. 90 tablet 1  . isosorbide mononitrate (IMDUR) 60 MG 24 hr tablet Take 1 tablet (60 mg total) by mouth daily. 90 tablet 3  . losartan (COZAAR) 100 MG tablet Take 1 tablet (100 mg total) by mouth daily. 90 tablet 3  . metoprolol succinate (TOPROL-XL) 50 MG 24 hr tablet Take 1 tablet (50 mg total) by mouth daily. Take with or immediately following a meal. 90 tablet 3  . nitroGLYCERIN (NITROSTAT) 0.4 MG SL tablet Place 0.4 mg under the tongue every 5 (five) minutes as needed for chest pain (x 3 pills).    Marland Kitchen omeprazole (PRILOSEC) 20 MG capsule Take 20 mg by mouth daily as needed (heartburn, acid reflux).    Marland Kitchen OVER THE COUNTER MEDICATION Place 1 drop into both eyes daily as needed (dry eyes). Eye drops    . Probiotic Product (ALIGN) 4 MG CAPS Take 1 capsule by mouth daily.    . simvastatin (ZOCOR) 10 MG tablet Take 1 tablet (10 mg total) by mouth every other day. 90 tablet 3  . triamcinolone ointment (KENALOG) 0.1 % Apply 1 application topically 2 (two) times daily.   0  . warfarin (COUMADIN)  1 MG tablet Take 0.5 mg by mouth as directed. M-W-F-SA  0  . warfarin (COUMADIN) 5 MG tablet Take 0.5 tablets (2.5 mg total) by mouth See admin instructions. Take half a tablet (2.5 mg) on Tuesdays, Thursdays, and Sundays. Take half a tablet with half a tablet of 1 mg (total 3 mg) on all other days. (Patient taking differently: Take 2.5 mg by mouth See admin instructions. Take half a tablet (2.5 mg) on Tuesdays, Thursdays, and Sundays. Take half a tablet with half a tablet of 1  mg (total 3 mg) on all other days) 90 tablet 3   No current facility-administered medications for this visit.    Allergies  Allergen Reactions  . Contrast Media [Iodinated Diagnostic Agents] Other (See Comments) and Hives    Per pt strong burning sensation starting in chest radiating outward Per pt strong burning sensation starting in chest radiating outward  . Statins Other (See Comments)    "bones hurt"  . Celebrex [Celecoxib] Rash  . Tape Itching and Rash    Red Where applied and will spread    Social History   Social History  . Marital Status: Married    Spouse Name: N/A  . Number of Children: N/A  . Years of Education: N/A   Occupational History  . Not on file.   Social History Main Topics  . Smoking status: Never Smoker   . Smokeless tobacco: Never Used  . Alcohol Use: No  . Drug Use: No  . Sexual Activity: Not Currently   Other Topics Concern  . Not on file   Social History Narrative     Review of Systems: General: negative for chills, fever, night sweats or weight changes.  Cardiovascular: negative for chest pain, dyspnea on exertion, edema, orthopnea, palpitations, paroxysmal nocturnal dyspnea or shortness of breath Dermatological: negative for rash Respiratory: negative for cough or wheezing Urologic: negative for hematuria Abdominal: negative for nausea, vomiting, diarrhea, bright red blood per rectum, melena, or hematemesis Neurologic: negative for visual changes, syncope, or dizziness All other systems reviewed and are otherwise negative except as noted above.    Blood pressure 130/64, pulse 60, height 5\' 7"  (1.702 m), weight 227 lb 6.4 oz (103.148 kg).  General appearance: alert, cooperative and no distress Neck: no carotid bruit and no JVD Lungs: clear to auscultation bilaterally Heart: irregularly irregular rhythm and regular rate Extremities: no LEE Pulses: 2+ and symmetric Skin: warm and dry Neurologic: Grossly normal  EKG not  performed  ASSESSMENT AND PLAN:   1. Takotsubo Cardiomyopathy:  EF 35-40%. She is stable denying any symptoms of acute exacerbation. No dyspnea, orthopnea, PND, lower extremity edema or weight gain. We will continue medical therapy. She is on beta blocker therapy will Metroprolol and ARB therapy with losartan. She is on the max dose of losartan. There is no room with HR to increase metoprolol. Will plan for repeat 2D echo in 3 months to reassess LVF.    2. Chest Tightness: Patient denies any recurrent CP since her Imdur was increased however the patient wants to discontinue Imdur as she feels that she is not tolerating it due to a rash. She reports that she always develops the same rash anytime she takes Imdur. She had the same issue when she took this several years ago. Despite chest pain relief, she wishes to discontinue this medication. We will taper her off Imdur gradually. She is to notify us if she has any recurrent chest discomfort and she is to monitor  her blood pressure closely at home. She is to notify our office if she has persistent systolic blood pressure readings greater than 140. If so, can consider adding amlodipine for blood pressure control.  3. Chronic Atrial Fibrillation: She is asymptomatic with her atrial fibrillation. She reports full compliance with Coumadin. Her INRs are followed by her PCP and have been stable. She denies any abnormal bleeding. Continue metoprolol for rate control.    PLAN  F/u 2D echo in 3 months to reassess LVF. F/u with Dr. [name] after 2D echo.   [name] PA-C 09/05/2015 11:54 AM

## 2015-09-11 ENCOUNTER — Other Ambulatory Visit: Payer: Self-pay | Admitting: *Deleted

## 2015-09-11 ENCOUNTER — Telehealth: Payer: Self-pay | Admitting: Family Medicine

## 2015-09-11 ENCOUNTER — Encounter: Payer: Self-pay | Admitting: Physician Assistant

## 2015-09-11 ENCOUNTER — Ambulatory Visit (INDEPENDENT_AMBULATORY_CARE_PROVIDER_SITE_OTHER): Payer: Medicare Other | Admitting: Physician Assistant

## 2015-09-11 ENCOUNTER — Ambulatory Visit
Admission: RE | Admit: 2015-09-11 | Discharge: 2015-09-11 | Disposition: A | Discharge status: Home / Self Care | Payer: Medicare Other | Source: Ambulatory Visit | Attending: Physician Assistant | Admitting: Physician Assistant

## 2015-09-11 VITALS — BP 170/90 | HR 68 | Temp 98.3°F | Resp 18 | Wt 225.0 lb

## 2015-09-11 DIAGNOSIS — R1013 Epigastric pain: Secondary | ICD-10-CM

## 2015-09-11 DIAGNOSIS — I10 Essential (primary) hypertension: Secondary | ICD-10-CM | POA: Diagnosis not present

## 2015-09-11 MED ORDER — CLONIDINE HCL 0.1 MG PO TABS
0.1000 mg | ORAL_TABLET | Freq: Once | ORAL | Status: AC
Start: 2015-09-11 — End: 2015-09-11
  Administered 2015-09-11: 0.1 mg via ORAL

## 2015-09-11 MED ORDER — AMLODIPINE BESYLATE 10 MG PO TABS: 10.0000 mg | ORAL_TABLET | Freq: Every day | ORAL | Status: DC

## 2015-09-11 NOTE — Progress Notes (Signed)
Patient ID: [name] MRN: [id number], DOB: 01-08-1935, 81 y.o. Date of Encounter: @DATE @  Chief Complaint:  Chief Complaint  Patient presents with  . sick x 2 days    cough, wheezing, keeping up at night    HPI: 81 y.o. year old white female  presents with above.   Initially, she kept saying that she was wheezing and having the same problem she had when she was treated with prednisone. However, she kept pointing to her epigastric region as the area that she was having this wheezing--- I then explained wheezing is in the lungs and was trying to understand what she was saying. She also mentioned at night having "gurgling sound in her lungs"--but, again, was pointing to the gastric area as the area that she was hearing this.  Also, initially she said that she needed some medicine to help her sleep because she had not slept good since being in the hospital. However, after further discussion, she says that actually she had not been sleeping good since 3 or 4 days prior to going to the hospital. Then once I probed further, and asked why she was not sleeping well, she again holds to that epigastric region and says that it's the discomfort there that is preventing her from sleeping well. She says "because it feels like a wad right there"--(pointing to her epigastric region)  When I asked about the cough, she says that it is just been a hacky cough that is off and on / intermittent.  I reviewed Dr. Dorian Heckle office note dated 08/11/15. At that visit patient was complaining of abdominal pain and cough. At that visit Dr. Dorian Heckle assessment included right upper quadrant pain that she felt was likely gastritis or GERD untreated. Gave Dexilant samples.  Noted that The patient has had her gallbladder removed.  At that time she planned to obtain labs. Planned possible CT abdomen follow-up/pending lab results. As well she treated her for bronchitis with Levaquin and prednisone.  Labs included CMET,  CBC, Lipase which were all normal. At that result note she noted that labs were normal and said for patient to call us by Thursday if stomach pain not improved with Dexilant and at that time CT abdomen pelvis would need to be done.    Towards the end of the visit today,  we also discussed that her blood pressure was reading high today. She makes reference to cardiology stopping her Imdur and saying her blood pressure might go up.  Reviewed Cardiology note from 09/05/15. That Note includes the following: "Patient denies any recurrent chest pain since her Imdur was increased. However the patient wants to discontinue Imdur as she feels that she is not tolerating it due to a rash. She reports that she always develops the same rash anytime she takes Imdur. She had the same issue when she took this several years ago. Despite chest pain relief, she wishes to discontinue this medication. We will taper her off him to work gradually. She is to notify us if she has any recurrent chest discomfort and she is to monitor her blood pressure closely at home. She is to notify office if she has persistent systolic blood pressure readings greater than 140. If so can consider adding amlodipine for blood pressure control."   I then discussed my concern with pt.  Discussed that I was already planning to obtain CT abdomen and pelvis. Discussed that now in light of her severely high blood pressure in addition to the  epigastric pain she is experiencing, discussed that we really needed to call EMS to transfer her to the ER where they can  evaluate and manage all of these problems. However she refused.   I rechecked blood pressure myself and got 200/80 very loud and clear on the right. We then gave her clonidine 0.1 mg sublingual. Follow-up blood pressure check by the nurse was 170/90 and a few minutes later by myself was 170/80.  She was scheduled for CT abdomen and pelvis and is to go directly to the imaging facility  from our office. Both her husband and her son are here at the office and her son is driving her to the imaging facility from here and then to the pharmacy to pick up prescription of Norvasc. She left the office in stable condition and in no distress.    Past Medical History  Diagnosis Date  . Cancer [facility])     right colon and left breast  . Breast cancer ([place]) 01/06/2012  . Colon cancer ([place]) 01/06/2012  . Hernia   . GERD (gastroesophageal reflux disease)   . Hypertension   . Allergy     rhinitis  . Osteopenia   . Diverticulosis   . Pneumonia     hx child  . Hiatal hernia     denies  . Arthritis   . Coronary artery disease 2006    nonobstructive with 20-30% ostial LAD and LM  . Atrial fibrillation     chronic atrial fibrillation  . Edema extremities   . Hyperlipidemia   . COPD (chronic obstructive pulmonary disease) ([place])   . Dilated aortic root ([place])   . Pulmonary HTN ([facility])     moderate PASP 65mmHg  . Takotsubo syndrome 07/29/2015    a. EF 35-40% by echo; akinesis of mid-apical anteroseptal and apical myocardium     Home Meds: Outpatient Prescriptions Prior to Visit  Medication Sig Dispense Refill  . acetaminophen (TYLENOL) 325 MG tablet Take 650 mg by mouth every 6 (six) hours as needed for mild pain.    Marland Kitchen alum & mag hydroxide-simeth (MAALOX/MYLANTA) 200-200-20 MG/5ML suspension Take 30 mLs by mouth every 6 (six) hours as needed for indigestion or heartburn.    . Cholecalciferol (VITAMIN D) 2000 UNITS CAPS Take 1 capsule by mouth daily.    . diphenhydrAMINE (BENADRYL) 25 MG tablet Take 25 mg by mouth every 8 (eight) hours as needed (Bee sting).    . furosemide (LASIX) 20 MG tablet Take 1 tablet (20 mg total) by mouth daily. 90 tablet 1  . losartan (COZAAR) 100 MG tablet Take 1 tablet (100 mg total) by mouth daily. 90 tablet 3  . metoprolol succinate (TOPROL-XL) 50 MG 24 hr tablet Take 1 tablet (50 mg total) by mouth daily. Take with or immediately following a meal. 90  tablet 3  . nitroGLYCERIN (NITROSTAT) 0.4 MG SL tablet Place 0.4 mg under the tongue every 5 (five) minutes as needed for chest pain (x 3 pills).    Marland Kitchen omeprazole (PRILOSEC) 20 MG capsule Take 20 mg by mouth daily as needed (heartburn, acid reflux).    Marland Kitchen OVER THE COUNTER MEDICATION Place 1 drop into both eyes daily as needed (dry eyes). Eye drops    . Probiotic Product (ALIGN) 4 MG CAPS Take 1 capsule by mouth daily.    . simvastatin (ZOCOR) 10 MG tablet Take 1 tablet (10 mg total) by mouth every other day. 90 tablet 3  . triamcinolone ointment (KENALOG) 0.1 % Apply  1 application topically 2 (two) times daily.   0  . warfarin (COUMADIN) 1 MG tablet Take 0.5 mg by mouth as directed. M-W-F-SA  0  . warfarin (COUMADIN) 5 MG tablet Take 0.5 tablets (2.5 mg total) by mouth See admin instructions. Take half a tablet (2.5 mg) on Tuesdays, Thursdays, and Sundays. Take half a tablet with half a tablet of 1 mg (total 3 mg) on all other days. (Patient taking differently: Take 2.5 mg by mouth See admin instructions. Take half a tablet (2.5 mg) on Tuesdays, Thursdays, and Sundays. Take half a tablet with half a tablet of 1 mg (total 3 mg) on all other days) 90 tablet 3   No facility-administered medications prior to visit.    Allergies:  Allergies  Allergen Reactions  . Contrast Media [Iodinated Diagnostic Agents] Other (See Comments) and Hives    Per pt strong burning sensation starting in chest radiating outward Per pt strong burning sensation starting in chest radiating outward  . Statins Other (See Comments)    "bones hurt"  . Celebrex [Celecoxib] Rash  . Isosorbide Itching and Rash  . Tape Itching and Rash    Red Where applied and will spread    Social History   Social History  . Marital Status: Married    Spouse Name: N/A  . Number of Children: N/A  . Years of Education: N/A   Occupational History  . Not on file.   Social History Main Topics  . Smoking status: Never Smoker   .  Smokeless tobacco: Never Used  . Alcohol Use: No  . Drug Use: No  . Sexual Activity: Not Currently   Other Topics Concern  . Not on file   Social History Narrative    Family History  Problem Relation Age of Onset  . Heart disease Mother   . Cancer Sister     stomach and colon  . Heart disease Brother 17  . Cancer Sister   . Heart attack Mother   . Hypertension Father   . Stroke Neg Hx      Review of Systems:  See HPI for pertinent ROS. All other ROS negative.    Physical Exam: Blood pressure 170/90, pulse 68, temperature 98.3 F (36.8 C), temperature source Oral, resp. rate 18, weight 225 lb (102.059 kg)., Body mass index is 35.23 kg/(m^2). General: Obese WF. Appears in no acute distress. Neck: Supple. No thyromegaly. No lymphadenopathy. Lungs: Clear bilaterally to auscultation without wheezes, rales, or rhonchi. Breathing is unlabored. Lungs sound very clear with good breath sounds and good air movement throughout. She does have a hacking cough repetitive throughout the visit. Heart: Irregular rhythm. Abdomen: She has severe tenderness with palpation of epigastric region.  Musculoskeletal:  Strength and tone normal for age. Extremities/Skin: Warm and dry. Neuro: Alert and oriented X 3. Moves all extremities spontaneously. Gait is normal. CNII-XII grossly in tact. Psych:  Responds to questions appropriately with a normal affect.     ASSESSMENT AND PLAN:  81 y.o. year old female with  1. Abdominal pain, epigastric - CT Abdomen Pelvis Wo Contrast; Future  2. HTN (hypertension), malignant - amLODipine (NORVASC) 10 MG tablet; Take 1 tablet (10 mg total) by mouth daily.  Dispense: 30 tablet; Refill: 3 - cloNIDine (CATAPRES) tablet 0.1 mg; Take 1 tablet (0.1 mg total) by mouth once.  She is going directly to [facility] imaging for CT abdomen and pelvis. I will follow-up on getting this result as soon as result is available. When  she leaves from getting her CT scan, they  are to go to the pharmacy and pick up a prescription Norvasc. She is to start taking this once daily. She will need follow-up office visit here in one week to follow-up her blood pressure.  In the interim I will follow-up with her regarding further management of the epigastric pain once I get results of the CT scan.  [address] [place], [place], [facility] 09/11/2015 1:49 PM

## 2015-09-11 NOTE — Telephone Encounter (Signed)
error 

## 2015-09-17 ENCOUNTER — Ambulatory Visit (INDEPENDENT_AMBULATORY_CARE_PROVIDER_SITE_OTHER): Payer: Medicare Other | Admitting: Physician Assistant

## 2015-09-17 ENCOUNTER — Encounter: Payer: Self-pay | Admitting: Physician Assistant

## 2015-09-17 VITALS — BP 184/86 | HR 60 | Temp 98.7°F | Resp 18 | Wt 225.0 lb

## 2015-09-17 DIAGNOSIS — I5181 Takotsubo syndrome: Secondary | ICD-10-CM

## 2015-09-17 DIAGNOSIS — J439 Emphysema, unspecified: Secondary | ICD-10-CM

## 2015-09-17 DIAGNOSIS — I272 Other secondary pulmonary hypertension: Secondary | ICD-10-CM

## 2015-09-17 DIAGNOSIS — I10 Essential (primary) hypertension: Secondary | ICD-10-CM

## 2015-09-17 DIAGNOSIS — R0602 Shortness of breath: Secondary | ICD-10-CM | POA: Diagnosis not present

## 2015-09-17 MED ORDER — CLONIDINE HCL 0.1 MG PO TABS
0.1000 mg | ORAL_TABLET | Freq: Two times a day (BID) | ORAL | Status: DC
Start: 2015-09-17 — End: 2015-12-03

## 2015-09-17 NOTE — Progress Notes (Signed)
Patient ID: [name] MRN: [id number], DOB: April 24, 1935, 81 y.o. Date of Encounter: @DATE @  Chief Complaint:  Chief Complaint  Patient presents with  . follow up BP    HPI: 81 y.o. year old white female  presents with above.   THE FOLLOWING IS COPIED FROM OV NOTE WITH ME 09/11/2015: Initially, she kept saying that she was wheezing and having the same problem she had when she was treated with prednisone. However, she kept pointing to her epigastric region as the area that she was having this wheezing--- I then explained wheezing is in the lungs and was trying to understand what she was saying. She also mentioned at night having "gurgling sound in her lungs"--but, again, was pointing to the gastric area as the area that she was hearing this.  Also, initially she said that she needed some medicine to help her sleep because she had not slept good since being in the hospital. However, after further discussion, she says that actually she had not been sleeping good since 3 or 4 days prior to going to the hospital. Then once I probed further, and asked why she was not sleeping well, she again holds to that epigastric region and says that it's the discomfort there that is preventing her from sleeping well. She says "because it feels like a wad right there"--(pointing to her epigastric region)  When I asked about the cough, she says that it is just been a hacky cough that is off and on / intermittent.  I reviewed Dr. Dorian Heckle office note dated 08/11/15. At that visit patient was complaining of abdominal pain and cough. At that visit Dr. Dorian Heckle assessment included right upper quadrant pain that she felt was likely gastritis or GERD untreated. Gave Dexilant samples.  Noted that The patient has had her gallbladder removed.  At that time she planned to obtain labs. Planned possible CT abdomen follow-up/pending lab results. As well she treated her for bronchitis with Levaquin and prednisone.  Labs  included CMET, CBC, Lipase which were all normal. At that result note she noted that labs were normal and said for patient to call us by Thursday if stomach pain not improved with Dexilant and at that time CT abdomen pelvis would need to be done.    Towards the end of the visit today,  we also discussed that her blood pressure was reading high today. She makes reference to cardiology stopping her Imdur and saying her blood pressure might go up.  Reviewed Cardiology note from 09/05/15. That Note includes the following: "Patient denies any recurrent chest pain since her Imdur was increased. However the patient wants to discontinue Imdur as she feels that she is not tolerating it due to a rash. She reports that she always develops the same rash anytime she takes Imdur. She had the same issue when she took this several years ago. Despite chest pain relief, she wishes to discontinue this medication. We will taper her off him to work gradually. She is to notify us if she has any recurrent chest discomfort and she is to monitor her blood pressure closely at home. She is to notify office if she has persistent systolic blood pressure readings greater than 140. If so can consider adding amlodipine for blood pressure control."   I then discussed my concern with pt.  Discussed that I was already planning to obtain CT abdomen and pelvis. Discussed that now in light of her severely high blood pressure in addition to the  epigastric pain she is experiencing, discussed that we really needed to call EMS to transfer her to the ER where they can  evaluate and manage all of these problems. However she refused.   I rechecked blood pressure myself and got 200/80 very loud and clear on the right. We then gave her clonidine 0.1 mg sublingual. Follow-up blood pressure check by the nurse was 170/90 and a few minutes later by myself was 170/80.  She was scheduled for CT abdomen and pelvis and is to go directly to the  imaging facility from our office. Both her husband and her son are here at the office and her son is driving her to the imaging facility from here and then to the pharmacy to pick up prescription of Norvasc. She left the office in stable condition and in no distress.   ASSESSMENT AND PLAN:  81 y.o. year old female with  1. Abdominal pain, epigastric - CT Abdomen Pelvis Wo Contrast; Future  2. HTN (hypertension), malignant - amLODipine (NORVASC) 10 MG tablet; Take 1 tablet (10 mg total) by mouth daily.  Dispense: 30 tablet; Refill: 3 - cloNIDine (CATAPRES) tablet 0.1 mg; Take 1 tablet (0.1 mg total) by mouth once.  She is going directly to [facility] imaging for CT abdomen and pelvis. I will follow-up on getting this result as soon as result is available. When she leaves from getting her CT scan, they are to go to the pharmacy and pick up a prescription Norvasc. She is to start taking this once daily. She will need follow-up office visit here in one week to follow-up her blood pressure.  In the interim I will follow-up with her regarding further management of the epigastric pain once I get results of the CT scan.    09/17/2015 OV:  She reports she is taking Norvasc 10mg  QD--no adverse effects.   I further discussed with her the symptoms she has been experiencing and the duration of these symptoms.   She says that she has been having increasing shortness of breath with exertion over the past year.  She says that the symptoms she has at night are: --Says she sleeps in the bed, on one pillow. Feels comfortable with this-----until about 3 am----then she wakes up and at that time, feels the discomfort in her epigastric region/"chest" and feels some SOB. Says she gets up, goes to the BR, then goes back to bed. However, says she "lays there" but cannot fall back to sleep.  I asked if she feels like she is "smothering" when lying down and she says "no".  Says she "cant do everything she  wants to do" secondary to SOB/DOE--but cannot name specific activities or specific duration. --Says "off and on for past year"  I told her I am trying to figure out if epigastric pain she had at [facility] is "chest pain"--- However, at [place], she had Significnat tenderness with palpation of the epigastric region and she again today says that "it was real sore in there last week but that is better."  Also, asked if any of thses symptoms were better when she was on Imdur and she says no---says that this "epigastric pain" was present then and the SOB sensation was the same then.   Past Medical History  Diagnosis Date  . Cancer [facility])     right colon and left breast  . Breast cancer ([place]) 01/06/2012  . Colon cancer ([place]) 01/06/2012  . Hernia   . GERD (gastroesophageal reflux disease)   .  Hypertension   . Allergy     rhinitis  . Osteopenia   . Diverticulosis   . Pneumonia     hx child  . Hiatal hernia     denies  . Arthritis   . Coronary artery disease 2006    nonobstructive with 20-30% ostial LAD and LM  . Atrial fibrillation     chronic atrial fibrillation  . Edema extremities   . Hyperlipidemia   . COPD (chronic obstructive pulmonary disease) ([place])   . Dilated aortic root ([place])   . Pulmonary HTN (HCC)     moderate PASP 66mmHg  . Takotsubo syndrome 07/29/2015    a. EF 35-40% by echo; akinesis of mid-apical anteroseptal and apical myocardium     Home Meds: Outpatient Prescriptions Prior to Visit  Medication Sig Dispense Refill  . acetaminophen (TYLENOL) 325 MG tablet Take 650 mg by mouth every 6 (six) hours as needed for mild pain.    Marland Kitchen alum & mag hydroxide-simeth (MAALOX/MYLANTA) 200-200-20 MG/5ML suspension Take 30 mLs by mouth every 6 (six) hours as needed for indigestion or heartburn.    Marland Kitchen amLODipine (NORVASC) 10 MG tablet Take 1 tablet (10 mg total) by mouth daily. 30 tablet 3  . Cholecalciferol (VITAMIN D) 2000 UNITS CAPS Take 1 capsule by mouth daily.    . diphenhydrAMINE  (BENADRYL) 25 MG tablet Take 25 mg by mouth every 8 (eight) hours as needed (Bee sting).    . furosemide (LASIX) 20 MG tablet Take 1 tablet (20 mg total) by mouth daily. 90 tablet 1  . losartan (COZAAR) 100 MG tablet Take 1 tablet (100 mg total) by mouth daily. 90 tablet 3  . metoprolol succinate (TOPROL-XL) 50 MG 24 hr tablet Take 1 tablet (50 mg total) by mouth daily. Take with or immediately following a meal. 90 tablet 3  . nitroGLYCERIN (NITROSTAT) 0.4 MG SL tablet Place 0.4 mg under the tongue every 5 (five) minutes as needed for chest pain (x 3 pills).    Marland Kitchen omeprazole (PRILOSEC) 20 MG capsule Take 20 mg by mouth daily as needed (heartburn, acid reflux).    Marland Kitchen OVER THE COUNTER MEDICATION Place 1 drop into both eyes daily as needed (dry eyes). Eye drops    . Probiotic Product (ALIGN) 4 MG CAPS Take 1 capsule by mouth daily.    . simvastatin (ZOCOR) 10 MG tablet Take 1 tablet (10 mg total) by mouth every other day. 90 tablet 3  . triamcinolone ointment (KENALOG) 0.1 % Apply 1 application topically 2 (two) times daily.   0  . warfarin (COUMADIN) 1 MG tablet Take 0.5 mg by mouth as directed. M-W-F-SA  0  . warfarin (COUMADIN) 5 MG tablet Take 0.5 tablets (2.5 mg total) by mouth See admin instructions. Take half a tablet (2.5 mg) on Tuesdays, Thursdays, and Sundays. Take half a tablet with half a tablet of 1 mg (total 3 mg) on all other days. (Patient taking differently: Take 2.5 mg by mouth See admin instructions. Take half a tablet (2.5 mg) on Tuesdays, Thursdays, and Sundays. Take half a tablet with half a tablet of 1 mg (total 3 mg) on all other days) 90 tablet 3   No facility-administered medications prior to visit.    Allergies:  Allergies  Allergen Reactions  . Contrast Media [Iodinated Diagnostic Agents] Other (See Comments) and Hives    Per pt strong burning sensation starting in chest radiating outward Per pt strong burning sensation starting in chest radiating outward  .  Statins  Other (See Comments)    "bones hurt"  . Celebrex [Celecoxib] Rash  . Isosorbide Itching and Rash  . Tape Itching and Rash    Red Where applied and will spread    Social History   Social History  . Marital Status: Married    Spouse Name: N/A  . Number of Children: N/A  . Years of Education: N/A   Occupational History  . Not on file.   Social History Main Topics  . Smoking status: Never Smoker   . Smokeless tobacco: Never Used  . Alcohol Use: No  . Drug Use: No  . Sexual Activity: Not Currently   Other Topics Concern  . Not on file   Social History Narrative    Family History  Problem Relation Age of Onset  . Heart disease Mother   . Cancer Sister     stomach and colon  . Heart disease Brother 66  . Cancer Sister   . Heart attack Mother   . Hypertension Father   . Stroke Neg Hx      Review of Systems:  See HPI for pertinent ROS. All other ROS negative.    Physical Exam: Blood pressure 184/86, pulse 60, temperature 98.7 F (37.1 C), temperature source Oral, resp. rate 18, weight 225 lb (102.059 kg)., Body mass index is 35.23 kg/(m^2). General: Obese WF. Appears in no acute distress. Neck: Supple. No thyromegaly. No lymphadenopathy. Lungs: Clear bilaterally to auscultation without wheezes, rales, or rhonchi. Breathing is unlabored. Lungs sound very clear with good breath sounds and good air movement throughout. Heart: Irregular rhythm. Abdomen: She has mild tenderness with palpation of epigastric region.  Musculoskeletal:  Strength and tone normal for age. Extremities/Skin: Warm and dry. Neuro: Alert and oriented X 3. Moves all extremities spontaneously. Gait is normal. CNII-XII grossly in tact. Psych:  Responds to questions appropriately with a normal affect.     ASSESSMENT AND PLAN:  81 y.o. year old female with   1. Essential hypertension I repeated BP check myself--on the Right arm---get 190/80, 180/80 Reviewed recent cath report but it was performed  through Radial artery and makes no mention of looking at Renal Arteries.  Will check Renal Artery U/S to eval for renal artery stenosis.  Will add Clonidine. Cont all current meds.  Her next appt at Cardiology is not until March. Will schedule f/u at Cardiology within one week.  Will have her schedule f/u OV with me in one week--if sees Cardiology that week, then can wait to f/u with me the following week.  She voices understanding and agrees to f/u with plans as outlined above.   - US Renal Artery Stenosis; Future - cloNIDine (CATAPRES) 0.1 MG tablet; Take 1 tablet (0.1 mg total) by mouth 2 (two) times daily.  Dispense: 60 tablet; Refill: 3 - Ambulatory referral to Cardiology  2. Pulmonary emphysema, unspecified emphysema type ([place]) - Ambulatory referral to Cardiology  3. Pulmonary HTN ([place]) - Ambulatory referral to Cardiology  4. SOB (shortness of breath) - Ambulatory referral to Cardiology  5. Takotsubo syndrome - Ambulatory referral to Cardiology    Signed, Olean Ree [place], [place], [facility] 09/17/2015 11:03 AM

## 2015-09-18 ENCOUNTER — Ambulatory Visit: Payer: Self-pay | Admitting: Physician Assistant

## 2015-09-18 ENCOUNTER — Other Ambulatory Visit: Payer: Self-pay | Admitting: Physician Assistant

## 2015-09-18 NOTE — Telephone Encounter (Signed)
Medication refilled per protocol. 

## 2015-09-22 ENCOUNTER — Emergency Department (HOSPITAL_COMMUNITY): Payer: Medicare Other

## 2015-09-22 ENCOUNTER — Encounter (HOSPITAL_COMMUNITY): Payer: Self-pay

## 2015-09-22 ENCOUNTER — Emergency Department (HOSPITAL_COMMUNITY)
Admission: EM | Admit: 2015-09-22 | Discharge: 2015-09-22 | Disposition: A | Discharge status: Home / Self Care | Payer: Medicare Other | Attending: Emergency Medicine | Admitting: Emergency Medicine

## 2015-09-22 DIAGNOSIS — I509 Heart failure, unspecified: Secondary | ICD-10-CM | POA: Diagnosis not present

## 2015-09-22 DIAGNOSIS — I2510 Atherosclerotic heart disease of native coronary artery without angina pectoris: Secondary | ICD-10-CM | POA: Insufficient documentation

## 2015-09-22 DIAGNOSIS — K219 Gastro-esophageal reflux disease without esophagitis: Secondary | ICD-10-CM | POA: Diagnosis not present

## 2015-09-22 DIAGNOSIS — Z8701 Personal history of pneumonia (recurrent): Secondary | ICD-10-CM | POA: Diagnosis not present

## 2015-09-22 DIAGNOSIS — R0600 Dyspnea, unspecified: Secondary | ICD-10-CM

## 2015-09-22 DIAGNOSIS — Z853 Personal history of malignant neoplasm of breast: Secondary | ICD-10-CM | POA: Diagnosis not present

## 2015-09-22 DIAGNOSIS — Z85038 Personal history of other malignant neoplasm of large intestine: Secondary | ICD-10-CM | POA: Insufficient documentation

## 2015-09-22 DIAGNOSIS — R1013 Epigastric pain: Secondary | ICD-10-CM | POA: Insufficient documentation

## 2015-09-22 DIAGNOSIS — J441 Chronic obstructive pulmonary disease with (acute) exacerbation: Secondary | ICD-10-CM | POA: Insufficient documentation

## 2015-09-22 DIAGNOSIS — I10 Essential (primary) hypertension: Secondary | ICD-10-CM | POA: Insufficient documentation

## 2015-09-22 DIAGNOSIS — Z7901 Long term (current) use of anticoagulants: Secondary | ICD-10-CM | POA: Insufficient documentation

## 2015-09-22 DIAGNOSIS — E785 Hyperlipidemia, unspecified: Secondary | ICD-10-CM | POA: Diagnosis not present

## 2015-09-22 DIAGNOSIS — Z8739 Personal history of other diseases of the musculoskeletal system and connective tissue: Secondary | ICD-10-CM | POA: Insufficient documentation

## 2015-09-22 DIAGNOSIS — I4891 Unspecified atrial fibrillation: Secondary | ICD-10-CM | POA: Diagnosis not present

## 2015-09-22 DIAGNOSIS — Z9889 Other specified postprocedural states: Secondary | ICD-10-CM | POA: Diagnosis not present

## 2015-09-22 DIAGNOSIS — R0602 Shortness of breath: Secondary | ICD-10-CM | POA: Diagnosis present

## 2015-09-22 DIAGNOSIS — Z7952 Long term (current) use of systemic steroids: Secondary | ICD-10-CM | POA: Diagnosis not present

## 2015-09-22 DIAGNOSIS — Z79899 Other long term (current) drug therapy: Secondary | ICD-10-CM | POA: Insufficient documentation

## 2015-09-22 LAB — CBC WITH DIFFERENTIAL/PLATELET
BASOS ABS: 0 10*3/uL (ref 0.0–0.1)
BASOS PCT: 0 %
EOS ABS: 0 10*3/uL (ref 0.0–0.7)
EOS PCT: 0 %
HCT: 39 % (ref 36.0–46.0)
Hemoglobin: 12.4 g/dL (ref 12.0–15.0)
LYMPHS PCT: 18 %
Lymphs Abs: 1.2 10*3/uL (ref 0.7–4.0)
MCH: 29.7 pg (ref 26.0–34.0)
MCHC: 31.8 g/dL (ref 30.0–36.0)
MCV: 93.5 fL (ref 78.0–100.0)
MONO ABS: 0.6 10*3/uL (ref 0.1–1.0)
Monocytes Relative: 9 %
NEUTROS ABS: 5.1 10*3/uL (ref 1.7–7.7)
NEUTROS PCT: 73 %
PLATELETS: 196 10*3/uL (ref 150–400)
RBC: 4.17 MIL/uL (ref 3.87–5.11)
RDW: 13.8 % (ref 11.5–15.5)
WBC: 7 10*3/uL (ref 4.0–10.5)

## 2015-09-22 LAB — TROPONIN I: Troponin I: <0.03 ng/mL (ref <0.031)

## 2015-09-22 LAB — COMPREHENSIVE METABOLIC PANEL
ALBUMIN: 3.8 g/dL (ref 3.5–5.0)
ALT: 24 U/L (ref 14–54)
AST: 30 U/L (ref 15–41)
Alkaline Phosphatase: 69 U/L (ref 38–126)
Anion gap: 9 (ref 5–15)
BUN: 12 mg/dL (ref 6–20)
CHLORIDE: 104 mmol/L (ref 101–111)
CO2: 27 mmol/L (ref 22–32)
Calcium: 9.4 mg/dL (ref 8.9–10.3)
Creatinine, Ser: 0.72 mg/dL (ref 0.44–1.00)
GFR calc Af Amer: >60 mL/min (ref >60)
Glucose, Bld: 123 mg/dL — ABNORMAL HIGH (ref 65–99)
POTASSIUM: 4.6 mmol/L (ref 3.5–5.1)
SODIUM: 140 mmol/L (ref 135–145)
Total Bilirubin: 0.6 mg/dL (ref 0.3–1.2)
Total Protein: 7.3 g/dL (ref 6.5–8.1)

## 2015-09-22 LAB — BRAIN NATRIURETIC PEPTIDE: B Natriuretic Peptide: 118.1 pg/mL — ABNORMAL HIGH (ref 0.0–100.0)

## 2015-09-22 LAB — LIPASE, BLOOD: LIPASE: 22 U/L (ref 11–51)

## 2015-09-22 MED ORDER — PREDNISONE 20 MG PO TABS: 40.0000 mg | ORAL_TABLET | Freq: Every day | ORAL | Status: DC

## 2015-09-22 MED ORDER — FENTANYL CITRATE (PF) 100 MCG/2ML IJ SOLN
100.0000 ug | Freq: Once | INTRAMUSCULAR | Status: AC
  Administered 2015-09-22: 100 ug via INTRAVENOUS
  Filled 2015-09-22: qty 2

## 2015-09-22 MED ORDER — GI COCKTAIL ~~LOC~~
30.0000 mL | Freq: Once | ORAL | Status: AC
  Administered 2015-09-22: 30 mL via ORAL
  Filled 2015-09-22: qty 30

## 2015-09-22 MED ORDER — FUROSEMIDE 20 MG PO TABS: 40.0000 mg | ORAL_TABLET | Freq: Every day | ORAL | Status: DC

## 2015-09-22 MED ORDER — ASPIRIN 325 MG PO TABS
325.0000 mg | ORAL_TABLET | Freq: Once | ORAL | Status: DC
  Filled 2015-09-22: qty 1

## 2015-09-22 MED ORDER — PREDNISONE 20 MG PO TABS
40.0000 mg | ORAL_TABLET | Freq: Once | ORAL | Status: AC
  Administered 2015-09-22: 40 mg via ORAL
  Filled 2015-09-22: qty 2

## 2015-09-22 MED ORDER — FUROSEMIDE 10 MG/ML IJ SOLN
40.0000 mg | Freq: Once | INTRAMUSCULAR | Status: AC
  Administered 2015-09-22: 40 mg via INTRAVENOUS
  Filled 2015-09-22: qty 4

## 2015-09-22 MED ORDER — IPRATROPIUM-ALBUTEROL 0.5-2.5 (3) MG/3ML IN SOLN
3.0000 mL | RESPIRATORY_TRACT | Status: DC
Start: 2015-09-22 — End: 2015-09-22
  Administered 2015-09-22: 3 mL via RESPIRATORY_TRACT
  Filled 2015-09-22: qty 3

## 2015-09-22 NOTE — ED Notes (Addendum)
Pt arrives EMS with c/o upper central abdominal pain and c/o 5-06 episodes of diarrhea in last 24 hours. Pt states seen with similar symptoms in  November but is unclear to etiology. Pt c/o SHOB and states this is because it hurts to breath at ribs.. Pt received 324 ASA and 2 sl NTG from EMS but Pt declined further  NTG

## 2015-09-22 NOTE — ED Notes (Signed)
Ordered Reg tray

## 2015-09-22 NOTE — ED Notes (Signed)
Pt returns from xray

## 2015-09-22 NOTE — ED Provider Notes (Signed)
CSN: [id number]     Arrival date & time 09/22/15  0846 History   First MD Initiated Contact with Patient 09/22/15 [phone]     Chief Complaint  Patient presents with  . Abdominal Pain     (Consider location/radiation/quality/duration/timing/severity/associated sxs/prior Treatment) Patient is a 81 y.o. female presenting with shortness of breath.  Shortness of Breath Severity:  Mild Onset quality:  Gradual Duration:  1 minute Timing:  Intermittent Progression:  Worsening Chronicity:  New Context: not activity   Relieved by:  None tried Worsened by:  Nothing tried Ineffective treatments:  None tried Associated symptoms: abdominal pain (epigastric)   Associated symptoms: no chest pain, no sore throat and no vomiting     Past Medical History  Diagnosis Date  . Cancer [facility])     right colon and left breast  . Breast cancer ([place]) 01/06/2012  . Colon cancer ([place]) 01/06/2012  . Hernia   . GERD (gastroesophageal reflux disease)   . Hypertension   . Allergy     rhinitis  . Osteopenia   . Diverticulosis   . Pneumonia     hx child  . Hiatal hernia     denies  . Arthritis   . Coronary artery disease 2006    nonobstructive with 20-30% ostial LAD and LM  . Atrial fibrillation     chronic atrial fibrillation  . Edema extremities   . Hyperlipidemia   . COPD (chronic obstructive pulmonary disease) ([place])   . Dilated aortic root ([place])   . Pulmonary HTN (HCC)     moderate PASP 74mmHg  . Takotsubo syndrome 07/29/2015    a. EF 35-40% by echo; akinesis of mid-apical anteroseptal and apical myocardium   Past Surgical History  Procedure Laterality Date  . Colectomy      right side  . Breast surgery      lumpectomy left  . Eye surgery Bilateral 12    cataracts  . Appendectomy    . Cholecystectomy    . Excision of accessory nipple Bilateral 05/30/2013    Procedure: BILATERAL NIPPLE BIOPSY;  Surgeon: Merrie Roof, MD;  Location: [place];  Service: General;  Laterality: Bilateral;  .  Mastectomy partial / lumpectomy  2008    left breast  . Cardiac catheterization    . Cardiac catheterization N/A 07/28/2015    Procedure: Left Heart Cath and Coronary Angiography;  Surgeon: Peter M Martinique, MD;  Location: [place] CV LAB;  Service: Cardiovascular;  Laterality: N/A;   Family History  Problem Relation Age of Onset  . Heart disease Mother   . Cancer Sister     stomach and colon  . Heart disease Brother 62  . Cancer Sister   . Heart attack Mother   . Hypertension Father   . Stroke Neg Hx    Social History  Substance Use Topics  . Smoking status: Never Smoker   . Smokeless tobacco: Never Used  . Alcohol Use: No   OB History    No data available     Review of Systems  Constitutional: Negative for chills and fatigue.  HENT: Negative for sore throat and voice change.   Eyes: Negative for pain.  Respiratory: Positive for shortness of breath.   Cardiovascular: Negative for chest pain.  Gastrointestinal: Positive for abdominal pain (epigastric). Negative for nausea, vomiting and diarrhea.  Skin: Negative for pallor and wound.  All other systems reviewed and are negative.     Allergies  Contrast media; Statins; Celebrex; Isosorbide;  and Tape  Home Medications   Prior to Admission medications   Medication Sig Start Date End Date Taking? Authorizing Provider  acetaminophen (TYLENOL) 325 MG tablet Take 650 mg by mouth every 6 (six) hours as needed for mild pain.    Historical Provider, MD  alum & mag hydroxide-simeth (MAALOX/MYLANTA) 200-200-20 MG/5ML suspension Take 30 mLs by mouth every 6 (six) hours as needed for indigestion or heartburn.    Historical Provider, MD  amLODipine (NORVASC) 10 MG tablet Take 1 tablet (10 mg total) by mouth daily. 09/11/15   Orlena Sheldon, PA-C  Cholecalciferol (VITAMIN D) 2000 UNITS CAPS Take 1 capsule by mouth daily.    Historical Provider, MD  cloNIDine (CATAPRES) 0.1 MG tablet Take 1 tablet (0.1 mg total) by mouth 2 (two) times  daily. 09/17/15   Lonie Peak Dixon, PA-C  diphenhydrAMINE (BENADRYL) 25 MG tablet Take 25 mg by mouth every 8 (eight) hours as needed (Bee sting).    Historical Provider, MD  furosemide (LASIX) 20 MG tablet TAKE 1 TABLET (20 MG TOTAL) BY MOUTH DAILY. 09/18/15   Lonie Peak Dixon, PA-C  losartan (COZAAR) 100 MG tablet Take 1 tablet (100 mg total) by mouth daily. 11/27/14   [name], MD  metoprolol succinate (TOPROL-XL) 50 MG 24 hr tablet Take 1 tablet (50 mg total) by mouth daily. Take with or immediately following a meal. 10/29/14   [name], MD  nitroGLYCERIN (NITROSTAT) 0.4 MG SL tablet Place 0.4 mg under the tongue every 5 (five) minutes as needed for chest pain (x 3 pills).    Historical Provider, MD  omeprazole (PRILOSEC) 20 MG capsule Take 20 mg by mouth daily as needed (heartburn, acid reflux).    Historical Provider, MD  OVER THE COUNTER MEDICATION Place 1 drop into both eyes daily as needed (dry eyes). Eye drops    Historical Provider, MD  Probiotic Product (ALIGN) 4 MG CAPS Take 1 capsule by mouth daily.    Historical Provider, MD  simvastatin (ZOCOR) 10 MG tablet Take 1 tablet (10 mg total) by mouth every other day. 11/06/14   [name], MD  triamcinolone ointment (KENALOG) 0.1 % Apply 1 application topically 2 (two) times daily.  03/05/15   Historical Provider, MD  warfarin (COUMADIN) 1 MG tablet Take 0.5 mg by mouth as directed. M-W-F-SA 05/23/15   Historical Provider, MD  warfarin (COUMADIN) 5 MG tablet Take 0.5 tablets (2.5 mg total) by mouth See admin instructions. Take half a tablet (2.5 mg) on Tuesdays, Thursdays, and Sundays. Take half a tablet with half a tablet of 1 mg (total 3 mg) on all other days. Patient taking differently: Take 2.5 mg by mouth See admin instructions. Take half a tablet (2.5 mg) on Tuesdays, Thursdays, and Sundays. Take half a tablet with half a tablet of 1 mg (total 3 mg) on all other days 11/27/14   [name], MD   BP 163/71 mmHg  Pulse 82   Temp(Src) 97.9 F (36.6 C) (Oral)  Resp 22  Ht 5\' 6"  (1.676 m)  Wt 225 lb (102.059 kg)  BMI 36.33 kg/m2  SpO2 95% Physical Exam  Constitutional: She is oriented to person, place, and time. She appears well-developed and well-nourished.  HENT:  Head: Normocephalic and atraumatic.  Neck: Normal range of motion.  Cardiovascular: Normal rate and regular rhythm.   Pulmonary/Chest: Effort normal. No stridor. No respiratory distress.  Abdominal: Soft. Bowel sounds are normal. She exhibits no distension.  Musculoskeletal: Normal  range of motion. She exhibits no edema or tenderness.  Neurological: She is alert and oriented to person, place, and time. No cranial nerve deficit. Coordination normal.  Skin: Skin is warm and dry.  Nursing note and vitals reviewed.   ED Course  Procedures (including critical care time) Labs Review Labs Reviewed  COMPREHENSIVE METABOLIC PANEL - Abnormal; Notable for the following:    Glucose, Bld 123 (*)    All other components within normal limits  BRAIN NATRIURETIC PEPTIDE - Abnormal; Notable for the following:    B Natriuretic Peptide 118.1 (*)    All other components within normal limits  CBC WITH DIFFERENTIAL/PLATELET  LIPASE, BLOOD  TROPONIN I  TROPONIN I    Imaging Review Dg Chest 2 View  09/22/2015  CLINICAL DATA:  Upper abdominal pain and diarrhea for approximately 24 hours. Initial encounter. EXAM: CHEST  2 VIEW COMPARISON:  CT and PA and lateral chest 07/28/2015. Single view of the chest 11/04/2013. PA and lateral chest 09/04/2013. FINDINGS: There small bilateral pleural effusions. There is cardiomegaly. Prominence of the pulmonary interstitium is chronic. Small bilateral pleural effusions are identified. No pneumothorax. IMPRESSION: Cardiomegaly. Chronic coarsening of the pulmonary interstitium. Small bilateral pleural effusions. Electronically Signed   By: Inge Rise M.D.   On: 09/22/2015 10:19   I have personally reviewed and evaluated  these images and lab results as part of my medical decision-making.   EKG Interpretation   Date/Time:  Monday September 22 2015 11:19:05 EST Ventricular Rate:  62 PR Interval:    QRS Duration: 100 QT Interval:  462 QTC Calculation: 469 R Axis:   111 Text Interpretation:  Atrial fibrillation Lateral infarct, old Confirmed  by Beda Dula MD, Corene Cornea [phone]) on 09/22/2015 11:22:17 AM      MDM   Final diagnoses:  Dyspnea    81 year old female with chief complaint of abdominal pain but on history taking she really has cough and dyspnea. She has a history of heart failure secondary tachycardia so below cardiomyopathy and is on diuretics that she also has a history of COPD as well. Exam as above with some mild tachypnea and rales in the bases. No hypoxia or respiratory distress. Given some breathing treatments and some Lasix with subjective improvement of symptoms. Labs and imaging as documented above without overt signs of severe fluid overload. Patient observed immersed department and really wanted to go home. She has appointment with her cardiologist tomorrow and appointment with her primary doctor the next day. I discussed with her she could walk around without getting severely dyspneic or hypoxic she could be discharged.  Ambulated around department with minimal dyspnea, O2>92% on room air. Feels 'better'. Will fu w/ pcp and cardiologist as scheduled. Will increase lasix as well.   Merrily Pew, MD 09/23/15 1730

## 2015-09-23 ENCOUNTER — Encounter: Payer: Self-pay | Admitting: Cardiology

## 2015-09-23 ENCOUNTER — Ambulatory Visit (INDEPENDENT_AMBULATORY_CARE_PROVIDER_SITE_OTHER): Payer: Medicare Other | Admitting: Cardiology

## 2015-09-23 VITALS — BP 150/70 | HR 60 | Ht 66.5 in | Wt 224.8 lb

## 2015-09-23 DIAGNOSIS — I10 Essential (primary) hypertension: Secondary | ICD-10-CM | POA: Diagnosis not present

## 2015-09-23 MED ORDER — HYDRALAZINE HCL 10 MG PO TABS: 10.0000 mg | ORAL_TABLET | Freq: Three times a day (TID) | ORAL | Status: DC

## 2015-09-23 NOTE — Progress Notes (Signed)
09/23/2015 [name]   1935-02-10  [id number]  Primary Physician Karis Juba, PA-C Primary Cardiologist: [name]   Reason for Visit/CC: F/u for HTN  HPI:  The patient is a 81 year old female, followed by Dr. [name], who presents to clinic today for follow-up. [name] suffered a NSTEMI 07/28/15. Cardiac cath was done and showed minor nonobstructive CAD with severe LV systolic dysfunction consistent with Takotsubo cardiomyopathy. Follow-up 2-D echo showed an EF 35-40% with akinesis of the mid apical anterior septal and apical myocardium. [name] also has chronic atrial fibrillation on Coumadin, chronic diastolic heart failure, mild pulmonary hypertension, COPD and hypertension.  [name] was seen by Ermalinda Barrios, PA-C, for post hospital follow-up on 08/18/2015. [name] complained of continued exertional chest tightness. This was also present during her hospitalization and Imdur was added at time of discharge. [name] decided to increase her Imdur further to 60 mg. [name] was instructed to follow-up in 2 weeks for repeat evaluation.   I saw her back on 09/05/15 for f/u for medication monitoring. At that time, [name] had reported an adverse reaction to Imdur. [name] noted a rash on her back which [name] felt was associated with its use. [name] reported that [name] was on Imdur several years ago and the same rash occurred. [name] wanted to discontinued. Since her BP was controlled, we opted to discontinue Imdur with plans to start amlodipine for additional coverage. I also instructed her to f/u with Dr. Heron Nay in 3 months after her 3 month f/u echo.  Since her last OV 2 weeks ago, [name] has had issues with HTN. Her PCP at [facility] added clonidine for BP and [name] was instructed to f/u in our office for further recommendations. Also yesterday, [name] was seen in the ED with abdominal pain but w/u was negative and [name] was sent home. [name] has noticed improvement. Today in clinic, [name] reports that her rash has resolved. [name]  denies any exertional chest tightness. [name] reports that prior to her PCP adding clonidine, her BP at home was in the [id number] systolic. Her BP today is 150/70. HR is 57 bpm.    Current Outpatient Prescriptions  Medication Sig Dispense Refill  . acetaminophen (TYLENOL) 325 MG tablet Take 650 mg by mouth every 6 (six) hours as needed for mild pain.    Marland Kitchen alum & mag hydroxide-simeth (MAALOX/MYLANTA) 200-200-20 MG/5ML suspension Take 30 mLs by mouth every 6 (six) hours as needed for indigestion or heartburn.    Marland Kitchen amLODipine (NORVASC) 10 MG tablet Take 1 tablet (10 mg total) by mouth daily. 30 tablet 3  . Cholecalciferol (VITAMIN D) 2000 UNITS CAPS Take 1 capsule by mouth daily as needed (for supplementation).     . cloNIDine (CATAPRES) 0.1 MG tablet Take 1 tablet (0.1 mg total) by mouth 2 (two) times daily. 60 tablet 3  . diphenhydrAMINE (BENADRYL) 25 MG tablet Take 25 mg by mouth every 8 (eight) hours as needed (Bee sting).    . furosemide (LASIX) 20 MG tablet Take 2 tablets (40 mg total) by mouth daily. 14 tablet 0  . losartan (COZAAR) 100 MG tablet Take 1 tablet (100 mg total) by mouth daily. 90 tablet 3  . metoprolol succinate (TOPROL-XL) 50 MG 24 hr tablet Take 1 tablet (50 mg total) by mouth daily. Take with or immediately following a meal. 90 tablet 3  . nitroGLYCERIN (NITROSTAT) 0.4 MG SL tablet Place 0.4 mg under the tongue every 5 (five) minutes as needed for chest pain (x 3  pills).    Marland Kitchen omeprazole (PRILOSEC) 20 MG capsule Take 20 mg by mouth daily as needed (heartburn, acid reflux).    Marland Kitchen OVER THE COUNTER MEDICATION Place 1 drop into both eyes daily as needed (dry eyes). Eye drops    . predniSONE (DELTASONE) 20 MG tablet Take 2 tablets (40 mg total) by mouth daily with breakfast. 8 tablet 0  . Probiotic Product (ALIGN) 4 MG CAPS Take 1 capsule by mouth daily.    . simvastatin (ZOCOR) 10 MG tablet Take 10 mg by mouth daily as needed (choloesterol.).    Marland Kitchen triamcinolone ointment (KENALOG) 0.1  % Apply 1 application topically daily as needed.   0  . warfarin (COUMADIN) 1 MG tablet Take 0.5 mg by mouth as directed. M-W-F-SA  0  . warfarin (COUMADIN) 5 MG tablet Take 0.5 tablets (2.5 mg total) by mouth See admin instructions. Take half a tablet (2.5 mg) on Tuesdays, Thursdays, and Sundays. Take half a tablet with half a tablet of 1 mg (total 3 mg) on all other days. (Patient taking differently: Take 2.5 mg by mouth See admin instructions. Take half a tablet (2.5 mg) on Tuesdays, Thursdays, and Sundays. Take half a tablet with half a tablet of 1 mg (total 3 mg) on all other days) 90 tablet 3  . hydrALAZINE (APRESOLINE) 10 MG tablet Take 1 tablet (10 mg total) by mouth 3 (three) times daily. 90 tablet 5   No current facility-administered medications for this visit.    Allergies  Allergen Reactions  . Contrast Media [Iodinated Diagnostic Agents] Other (See Comments) and Hives    Per pt strong burning sensation starting in chest radiating outward Per pt strong burning sensation starting in chest radiating outward  . Clonidine Derivatives Other (See Comments)    Throat dry  . Statins Other (See Comments)    "bones hurt"  . Celebrex [Celecoxib] Rash  . Isosorbide Itching and Rash  . Other Itching and Rash    Plastic and paper tape  . Tape Itching and Rash    Red Where applied and will spread    Social History   Social History  . Marital Status: Married    Spouse Name: N/A  . Number of Children: N/A  . Years of Education: N/A   Occupational History  . Not on file.   Social History Main Topics  . Smoking status: Never Smoker   . Smokeless tobacco: Never Used  . Alcohol Use: No  . Drug Use: No  . Sexual Activity: Not Currently   Other Topics Concern  . Not on file   Social History Narrative     Review of Systems: General: negative for chills, fever, night sweats or weight changes.  Cardiovascular: negative for chest pain, dyspnea on exertion, edema, orthopnea,  palpitations, paroxysmal nocturnal dyspnea or shortness of breath Dermatological: negative for rash Respiratory: negative for cough or wheezing Urologic: negative for hematuria Abdominal: negative for nausea, vomiting, diarrhea, bright red blood per rectum, melena, or hematemesis Neurologic: negative for visual changes, syncope, or dizziness All other systems reviewed and are otherwise negative except as noted above.    Blood pressure 150/70, pulse 60, height 5' 6.5" (1.689 m), weight 224 lb 12.8 oz (101.969 kg).  General appearance: alert, cooperative and no distress Neck: no carotid bruit and no JVD Lungs: clear to auscultation bilaterally Heart: regular rate and rhythm Extremities: no LEE Pulses: 2+ and symmetric Skin: warm and dry Neurologic: Grossly normal  EKG atrial fibrillation (chronic) 57  bpm   ASSESSMENT AND PLAN:   1. HTN: her BP remains elevated despite multiple agents. [name] is on metoprolol, losartan, clonidine, amlodipine and lasix. [name] reports full medication compliance. There is no room to titrate metoprolol or clonidine due to resting bradycardia with a HR of 57 bpm. [name] is on the max dose of losartan and amlodipine. [name] is intolerant to Imdur. Given her systolic HF, we will add hydralazine 10 mg TID for additional coverage. [name] is to monitor her BP closely at home. [name] has been instructed to return in 7-10 days for BP check with RN or can get BP checked at PCP office (patient's preference as it is closer for her).  2. Takotsubo Cardiomyopathy: EF 35-40%. [name] is stable denying any symptoms of acute exacerbation. No dyspnea, orthopnea, PND, lower extremity edema or weight gain. We will continue medical therapy. [name] is on beta blocker therapy with Metroprolol and ARB therapy with losartan. [name] is on the max dose of losartan. There is no room with HR to increase metoprolol. [name] is intolerant to Imdur. We will add hydralazine for better BP control. Will plan for repeat 2D echo  in 3 months to reassess LVF.   PLAN  F/u with Dr. [name] in 3 months.   [name] PA-C 09/23/2015 11:44 AM

## 2015-09-23 NOTE — Patient Instructions (Signed)
Medication Instructions:  Your physician has recommended you make the following change in your medication:  1) START Hydralazine 10mg  Three times daily. An Rx has been sent to your pharmacy   Labwork: None ordered  Testing/Procedures: None ordered  Follow-Up: Follow up with your primary care physician for a blood pressure check  Follow up with Dr.Truner as planned in March 2017  Any Other Special Instructions Will Be Listed Below (If Applicable).     If you need a refill on your cardiac medications before your next appointment, please call your pharmacy.

## 2015-09-24 ENCOUNTER — Encounter: Payer: Self-pay | Admitting: Physician Assistant

## 2015-09-24 ENCOUNTER — Ambulatory Visit (INDEPENDENT_AMBULATORY_CARE_PROVIDER_SITE_OTHER): Payer: Medicare Other | Admitting: Physician Assistant

## 2015-09-24 VITALS — BP 122/80 | HR 68 | Temp 98.3°F | Resp 18 | Wt 229.0 lb

## 2015-09-24 DIAGNOSIS — G4700 Insomnia, unspecified: Secondary | ICD-10-CM | POA: Diagnosis not present

## 2015-09-24 DIAGNOSIS — Z7901 Long term (current) use of anticoagulants: Secondary | ICD-10-CM | POA: Diagnosis not present

## 2015-09-24 DIAGNOSIS — I10 Essential (primary) hypertension: Secondary | ICD-10-CM

## 2015-09-24 LAB — PT WITH INR/FINGERSTICK
INR, fingerstick: 2.7 — ABNORMAL HIGH (ref 0.80–1.20)
PT, fingerstick: 32.9 seconds — ABNORMAL HIGH (ref 10.4–12.5)

## 2015-09-24 MED ORDER — CLONAZEPAM 0.5 MG PO TABS: 0.5000 mg | ORAL_TABLET | Freq: Every day | ORAL | Status: DC

## 2015-09-25 ENCOUNTER — Telehealth: Payer: Self-pay | Admitting: Physician Assistant

## 2015-09-25 MED ORDER — OMEPRAZOLE 20 MG PO CPDR: 20.0000 mg | DELAYED_RELEASE_CAPSULE | Freq: Every day | ORAL | Status: DC | PRN

## 2015-09-25 MED ORDER — METOPROLOL SUCCINATE ER 50 MG PO TB24: 50.0000 mg | ORAL_TABLET | Freq: Every day | ORAL | Status: DC

## 2015-09-25 MED ORDER — LOSARTAN POTASSIUM 100 MG PO TABS: 100.0000 mg | ORAL_TABLET | Freq: Every day | ORAL | Status: DC

## 2015-09-25 NOTE — Addendum Note (Signed)
Addended by: Dena Billet on: 09/25/2015 10:15 AM   Modules accepted: Orders

## 2015-09-25 NOTE — Progress Notes (Signed)
Patient ID: [name] MRN: [id number], DOB: 1934-10-21, 81 y.o. Date of Encounter: @DATE @  Chief Complaint:  Chief Complaint  Patient presents with  . 1 week follow up    seen in ED  . PT/INR  . Medication Refill    metoprolol, losartan, lasix    HPI: 81 y.o. year old female  presents with above.   I reviewed my last office note dated 09/17/15. At that visit I ordered a renal artery ultrasound to evaluate for renal artery stenosis. Patient states that this has not yet been done. She is still taking the clonidine which I added at that visit. Also reviewed that we had planned for her to follow-up with her GI doctor because she kept pointing to her epigastric region as her area of pain. At her prior visit with me that area was even tender with palpation. Today she states that she did follow-up with Dr. Cristina Gong her GI doctor and that he did not think that her symptoms were GI related.  Today I reviewed ED note 09/22/15. At that time she was complaining of abdominal pain but then was having cough and dyspnea. They reviewed her history of heart failure but also history of COPD. Chest x-ray EKG and labs were performed. She was treated with breathing treatments and Lasix was subjective improvement of symptoms.  Today I reviewed her note at cardiology 09/23/15. They were aware that since her last visit with them just 2 weeks prior that she had come here to PCP and clonidine have been added. They also reviewed that she had gone to the ER again on the day prior to their visit. At that visit with cardiology, blood pressure was still elevated-- they added hydralazine 10 mg 3 times a day. They instructed her to follow up in 7-10 days for blood pressure check either there at their office or at their PCP office which was patient's preference as it is closer to her.  Today patient states that symptoms are stable. Is requesting medication to use for insomnia. Says that she used clonazepam in the past and  that worked well for her and is requesting this. Says that her son is scheduled for back surgery this upcoming week and that her husband has been found to have problems with gallstones and says that she gets upset thinking about these things and thinks that if she could use her clonazepam at night it would help her relax and go to sleep instead of getting worried and worked up about these things.   Past Medical History  Diagnosis Date  . Cancer [facility])     right colon and left breast  . Breast cancer ([place]) 01/06/2012  . Colon cancer ([place]) 01/06/2012  . Hernia   . GERD (gastroesophageal reflux disease)   . Hypertension   . Allergy     rhinitis  . Osteopenia   . Diverticulosis   . Pneumonia     hx child  . Hiatal hernia     denies  . Arthritis   . Coronary artery disease 2006    nonobstructive with 20-30% ostial LAD and LM  . Atrial fibrillation     chronic atrial fibrillation  . Edema extremities   . Hyperlipidemia   . COPD (chronic obstructive pulmonary disease) ([place])   . Dilated aortic root ([place])   . Pulmonary HTN ([facility])     moderate PASP 27mmHg  . Takotsubo syndrome 07/29/2015    a. EF 35-40% by echo; akinesis of mid-apical  anteroseptal and apical myocardium     Home Meds: Outpatient Prescriptions Prior to Visit  Medication Sig Dispense Refill  . acetaminophen (TYLENOL) 325 MG tablet Take 650 mg by mouth every 6 (six) hours as needed for mild pain.    Marland Kitchen alum & mag hydroxide-simeth (MAALOX/MYLANTA) 200-200-20 MG/5ML suspension Take 30 mLs by mouth every 6 (six) hours as needed for indigestion or heartburn.    Marland Kitchen amLODipine (NORVASC) 10 MG tablet Take 1 tablet (10 mg total) by mouth daily. 30 tablet 3  . Cholecalciferol (VITAMIN D) 2000 UNITS CAPS Take 1 capsule by mouth daily as needed (for supplementation).     . cloNIDine (CATAPRES) 0.1 MG tablet Take 1 tablet (0.1 mg total) by mouth 2 (two) times daily. 60 tablet 3  . diphenhydrAMINE (BENADRYL) 25 MG tablet Take 25 mg by mouth  every 8 (eight) hours as needed (Bee sting).    . furosemide (LASIX) 20 MG tablet Take 2 tablets (40 mg total) by mouth daily. 14 tablet 0  . hydrALAZINE (APRESOLINE) 10 MG tablet Take 1 tablet (10 mg total) by mouth 3 (three) times daily. 90 tablet 5  . losartan (COZAAR) 100 MG tablet Take 1 tablet (100 mg total) by mouth daily. 90 tablet 3  . metoprolol succinate (TOPROL-XL) 50 MG 24 hr tablet Take 1 tablet (50 mg total) by mouth daily. Take with or immediately following a meal. 90 tablet 3  . nitroGLYCERIN (NITROSTAT) 0.4 MG SL tablet Place 0.4 mg under the tongue every 5 (five) minutes as needed for chest pain (x 3 pills).    Marland Kitchen omeprazole (PRILOSEC) 20 MG capsule Take 20 mg by mouth daily as needed (heartburn, acid reflux).    Marland Kitchen OVER THE COUNTER MEDICATION Place 1 drop into both eyes daily as needed (dry eyes). Eye drops    . predniSONE (DELTASONE) 20 MG tablet Take 2 tablets (40 mg total) by mouth daily with breakfast. 8 tablet 0  . Probiotic Product (ALIGN) 4 MG CAPS Take 1 capsule by mouth daily.    . simvastatin (ZOCOR) 10 MG tablet Take 10 mg by mouth daily as needed (choloesterol.).    Marland Kitchen triamcinolone ointment (KENALOG) 0.1 % Apply 1 application topically daily as needed.   0  . warfarin (COUMADIN) 1 MG tablet Take 0.5 mg by mouth as directed. M-W-F-SA  0  . warfarin (COUMADIN) 5 MG tablet Take 0.5 tablets (2.5 mg total) by mouth See admin instructions. Take half a tablet (2.5 mg) on Tuesdays, Thursdays, and Sundays. Take half a tablet with half a tablet of 1 mg (total 3 mg) on all other days. (Patient taking differently: Take 2.5 mg by mouth See admin instructions. Take half a tablet (2.5 mg) on Tuesdays, Thursdays, and Sundays. Take half a tablet with half a tablet of 1 mg (total 3 mg) on all other days) 90 tablet 3   No facility-administered medications prior to visit.    Allergies:  Allergies  Allergen Reactions  . Contrast Media [Iodinated Diagnostic Agents] Other (See Comments)  and Hives    Per pt strong burning sensation starting in chest radiating outward Per pt strong burning sensation starting in chest radiating outward  . Clonidine Derivatives Other (See Comments)    Throat dry  . Statins Other (See Comments)    "bones hurt"  . Celebrex [Celecoxib] Rash  . Isosorbide Itching and Rash  . Other Itching and Rash    Plastic and paper tape  . Tape Itching and Rash  Red Where applied and will spread    Social History   Social History  . Marital Status: Married    Spouse Name: N/A  . Number of Children: N/A  . Years of Education: N/A   Occupational History  . Not on file.   Social History Main Topics  . Smoking status: Never Smoker   . Smokeless tobacco: Never Used  . Alcohol Use: No  . Drug Use: No  . Sexual Activity: Not Currently   Other Topics Concern  . Not on file   Social History Narrative    Family History  Problem Relation Age of Onset  . Heart disease Mother   . Cancer Sister     stomach and colon  . Heart disease Brother 42  . Cancer Sister   . Heart attack Mother   . Hypertension Father   . Stroke Neg Hx      Review of Systems:  See HPI for pertinent ROS. All other ROS negative.    Physical Exam: Blood pressure 122/80, pulse 68, temperature 98.3 F (36.8 C), temperature source Oral, resp. rate 18, weight 229 lb (103.874 kg)., Body mass index is 36.41 kg/(m^2). General: Obese white female . Appears in no acute distress. Neck: Supple. No thyromegaly. No lymphadenopathy. Lungs: Clear bilaterally to auscultation without wheezes, rales, or rhonchi. Breathing is unlabored. Heart: Irregular rhythm Musculoskeletal:  Strength and tone normal for age. Extremities/Skin: Warm and dry. Neuro: Alert and oriented X 3. Moves all extremities spontaneously. Gait is normal. CNII-XII grossly in tact. Psych:  Responds to questions appropriately with a normal affect.    Results for orders placed or performed in visit on 09/24/15  PT  with INR/Fingerstick  Result Value Ref Range   PT, fingerstick 32.9 (H) 10.4 - 12.5 seconds   INR, fingerstick 2.7 (H) 0.80 - 1.20    ASSESSMENT AND PLAN:  81 y.o. year old female with  1. Long term current use of anticoagulant therapy INR therapeutic at 2.7. Continue current dose of Coumadin. Recheck INR 4 weeks. - PT with INR/Fingerstick  2. Insomnia She is to take her clonazepam at night prior to going to bed. Told her if she still wakes up at 3 AM she can take another clonazepam at that time. If this does not control her insomnia, follow-up with me. I was considering using trazodone or a different medication that she was persistent about using clonazepam as she had in the past. If this is not effective, will change to trazadone or another medication. - clonazePAM (KLONOPIN) 0.5 MG tablet; Take 1 tablet (0.5 mg total) by mouth at bedtime.  Dispense: 30 tablet; Refill: 2  3. HTN Clonidine was added here 09/17/15. Hydralazine was added at cardiology 09/23/15. She needs follow-up visit in one week to recheck blood pressure and f/u regarding HTN.   4.Epigastric Pain/Chest Pain/Shortness of Breath Reassured patient that this has been evaluated thoroughly. Reassured her that if symptoms remain the same/stable, that she does not need to go to the emergency room etc. However, if symptoms do increase and worsen significantly, then she does need to seek medical evaluation.  Signed, [address] [place], [place], [facility] 09/25/2015 8:11 AM

## 2015-09-25 NOTE — Telephone Encounter (Signed)
Patient was prescribed clonazepam yesterday, pharmacy told her it needed preauthorization  [employer] number to call per patient is [phone]

## 2015-09-26 MED ORDER — TRAZODONE HCL 50 MG PO TABS: 50.0000 mg | ORAL_TABLET | Freq: Every evening | ORAL | Status: DC | PRN

## 2015-09-26 NOTE — Telephone Encounter (Signed)
Trazodone 50mg  1 po QHS # 30 + 5

## 2015-09-26 NOTE — Telephone Encounter (Signed)
Medicare has excluded Benzodiazepines, cough and cold medications, and muscle relaxers from all Medicare Part D formularies.   Cash price is $12.00.  Please advise.

## 2015-09-26 NOTE — Telephone Encounter (Signed)
Prescription sent to pharmacy.   Call placed to patient. LMTRC.  

## 2015-09-29 NOTE — Telephone Encounter (Signed)
Call placed to patient. LMTRC.  

## 2015-09-30 NOTE — Telephone Encounter (Signed)
Call placed to patient. LMTRC.  

## 2015-09-30 NOTE — Addendum Note (Signed)
Addended by: Freada Bergeron on: 09/30/2015 05:01 PM   Modules accepted: Orders

## 2015-10-01 NOTE — Telephone Encounter (Addendum)
Patient returned call and made aware.   States that she paid out of pocket for Clonazepam ($12).   States that she does not want to try Trazodone.   PA started.

## 2015-10-02 NOTE — Telephone Encounter (Signed)
Clonazepam is being denied.  Does not meet criteria for insomnia.  Have call into patient.

## 2015-10-03 NOTE — Telephone Encounter (Signed)
Call placed to patient. LMTRC.  

## 2015-10-06 NOTE — Telephone Encounter (Signed)
Call placed to patient and patient made aware.   Patient states that she will pay out of picket for Clonazepam. She does not want to try Trazodone.   Medication list updated.

## 2015-10-23 ENCOUNTER — Encounter: Payer: Self-pay | Admitting: Physician Assistant

## 2015-10-23 ENCOUNTER — Ambulatory Visit (INDEPENDENT_AMBULATORY_CARE_PROVIDER_SITE_OTHER): Payer: Medicare Other | Admitting: Physician Assistant

## 2015-10-23 ENCOUNTER — Other Ambulatory Visit: Payer: Self-pay | Admitting: Family Medicine

## 2015-10-23 VITALS — BP 122/76 | HR 56 | Temp 98.4°F | Resp 18 | Wt 226.0 lb

## 2015-10-23 DIAGNOSIS — J439 Emphysema, unspecified: Secondary | ICD-10-CM

## 2015-10-23 DIAGNOSIS — I482 Chronic atrial fibrillation, unspecified: Secondary | ICD-10-CM

## 2015-10-23 DIAGNOSIS — Z7901 Long term (current) use of anticoagulants: Secondary | ICD-10-CM | POA: Diagnosis not present

## 2015-10-23 DIAGNOSIS — G4700 Insomnia, unspecified: Secondary | ICD-10-CM

## 2015-10-23 LAB — PT WITH INR/FINGERSTICK
INR, fingerstick: 2 — ABNORMAL HIGH (ref 0.80–1.20)
PT, fingerstick: 23.7 seconds — ABNORMAL HIGH (ref 10.4–12.5)

## 2015-10-23 MED ORDER — ALBUTEROL SULFATE HFA 108 (90 BASE) MCG/ACT IN AERS: 2.0000 | INHALATION_SPRAY | Freq: Four times a day (QID) | RESPIRATORY_TRACT | Status: DC | PRN

## 2015-10-23 MED ORDER — NITROGLYCERIN 0.4 MG SL SUBL: 0.4000 mg | SUBLINGUAL_TABLET | SUBLINGUAL | Status: DC | PRN

## 2015-10-23 MED ORDER — TRAZODONE HCL 50 MG PO TABS: 50.0000 mg | ORAL_TABLET | Freq: Every evening | ORAL | Status: DC | PRN

## 2015-10-23 NOTE — Progress Notes (Signed)
Patient ID: [name] MRN: [id number], DOB: [birth year], 81 y.o. Date of Encounter: @DATE @  Chief Complaint:  Chief Complaint  Patient presents with  . 4 week followup    still breathing hard, esp in AM,  asked for refill of NTG (done)  but said she is using daily, said took 4 yesterday  . PT/INR    used last clonazepam last night, to expensive, needs somethinfg for sleep    HPI: 81 y.o. year old female  presents with above.   She is here for PT/INR check. Sates that she is taking her Coumadin as directed. Has had no skipped doses. No bleeding.  She also reports that she needs a different medication for sleep. Clonazepam was not covered by her insurance and is too expensive. Says that she cannot fall asleep until about 1 or 2 AM.  Says that when she "wakes up in the morning she feels a heavy feeling and huffing and puffing that lasts one hour until she gets settled down."    Past Medical History  Diagnosis Date  . Cancer [facility])     right colon and left breast  . Breast cancer ([place]) 01/06/2012  . Colon cancer ([place]) 01/06/2012  . Hernia   . GERD (gastroesophageal reflux disease)   . Hypertension   . Allergy     rhinitis  . Osteopenia   . Diverticulosis   . Pneumonia     hx child  . Hiatal hernia     denies  . Arthritis   . Coronary artery disease 2006    nonobstructive with 20-30% ostial LAD and LM  . Atrial fibrillation     chronic atrial fibrillation  . Edema extremities   . Hyperlipidemia   . COPD (chronic obstructive pulmonary disease) ([place])   . Dilated aortic root ([place])   . Pulmonary HTN (HCC)     moderate PASP 61mmHg  . Takotsubo syndrome 07/29/2015    a. EF 35-40% by echo; akinesis of mid-apical anteroseptal and apical myocardium     Home Meds: Outpatient Prescriptions Prior to Visit  Medication Sig Dispense Refill  . acetaminophen (TYLENOL) 325 MG tablet Take 650 mg by mouth every 6 (six) hours as needed for mild pain.    Marland Kitchen alum & mag hydroxide-simeth  (MAALOX/MYLANTA) 200-200-20 MG/5ML suspension Take 30 mLs by mouth every 6 (six) hours as needed for indigestion or heartburn.    Marland Kitchen amLODipine (NORVASC) 10 MG tablet Take 1 tablet (10 mg total) by mouth daily. 30 tablet 3  . Cholecalciferol (VITAMIN D) 2000 UNITS CAPS Take 1 capsule by mouth daily as needed (for supplementation).     . cloNIDine (CATAPRES) 0.1 MG tablet Take 1 tablet (0.1 mg total) by mouth 2 (two) times daily. 60 tablet 3  . diphenhydrAMINE (BENADRYL) 25 MG tablet Take 25 mg by mouth every 8 (eight) hours as needed (Bee sting).    . furosemide (LASIX) 20 MG tablet Take 2 tablets (40 mg total) by mouth daily. 14 tablet 0  . hydrALAZINE (APRESOLINE) 10 MG tablet Take 1 tablet (10 mg total) by mouth 3 (three) times daily. 90 tablet 5  . losartan (COZAAR) 100 MG tablet Take 1 tablet (100 mg total) by mouth daily. 90 tablet 3  . metoprolol succinate (TOPROL-XL) 50 MG 24 hr tablet Take 1 tablet (50 mg total) by mouth daily. Take with or immediately following a meal. 90 tablet 3  . omeprazole (PRILOSEC) 20 MG capsule Take 1 capsule (20 mg total)  by mouth daily as needed (heartburn, acid reflux). 90 capsule 3  . OVER THE COUNTER MEDICATION Place 1 drop into both eyes daily as needed (dry eyes). Eye drops    . Probiotic Product (ALIGN) 4 MG CAPS Take 1 capsule by mouth daily.    . simvastatin (ZOCOR) 10 MG tablet Take 10 mg by mouth daily as needed (choloesterol.).    Marland Kitchen triamcinolone ointment (KENALOG) 0.1 % Apply 1 application topically daily as needed.   0  . warfarin (COUMADIN) 1 MG tablet Take 0.5 mg by mouth as directed. M-W-F-SA  0  . warfarin (COUMADIN) 5 MG tablet Take 0.5 tablets (2.5 mg total) by mouth See admin instructions. Take half a tablet (2.5 mg) on Tuesdays, Thursdays, and Sundays. Take half a tablet with half a tablet of 1 mg (total 3 mg) on all other days. (Patient taking differently: Take 2.5 mg by mouth See admin instructions. Take half a tablet (2.5 mg) on Tuesdays,  Thursdays, and Sundays. Take half a tablet with half a tablet of 1 mg (total 3 mg) on all other days) 90 tablet 3  . nitroGLYCERIN (NITROSTAT) 0.4 MG SL tablet Place 0.4 mg under the tongue every 5 (five) minutes as needed for chest pain (x 3 pills).    . predniSONE (DELTASONE) 20 MG tablet Take 2 tablets (40 mg total) by mouth daily with breakfast. 8 tablet 0   No facility-administered medications prior to visit.    Allergies:  Allergies  Allergen Reactions  . Contrast Media [Iodinated Diagnostic Agents] Other (See Comments) and Hives    Per pt strong burning sensation starting in chest radiating outward Per pt strong burning sensation starting in chest radiating outward  . Clonidine Derivatives Other (See Comments)    Throat dry  . Statins Other (See Comments)    "bones hurt"  . Celebrex [Celecoxib] Rash  . Isosorbide Itching and Rash  . Other Itching and Rash    Plastic and paper tape  . Tape Itching and Rash    Red Where applied and will spread    Social History   Social History  . Marital Status: Married    Spouse Name: N/A  . Number of Children: N/A  . Years of Education: N/A   Occupational History  . Not on file.   Social History Main Topics  . Smoking status: Never Smoker   . Smokeless tobacco: Never Used  . Alcohol Use: No  . Drug Use: No  . Sexual Activity: Not Currently   Other Topics Concern  . Not on file   Social History Narrative    Family History  Problem Relation Age of Onset  . Heart disease Mother   . Cancer Sister     stomach and colon  . Heart disease Brother 70  . Cancer Sister   . Heart attack Mother   . Hypertension Father   . Stroke Neg Hx      Review of Systems:  See HPI for pertinent ROS. All other ROS negative.    Physical Exam: Blood pressure 122/76, pulse 56, temperature 98.4 F (36.9 C), temperature source Oral, resp. rate 18, weight 226 lb (102.513 kg)., Body mass index is 35.94 kg/(m^2). General: Obese white female .  Appears in no acute distress. Neck: Supple. No thyromegaly. No lymphadenopathy. Lungs: Clear bilaterally to auscultation without wheezes, rales, or rhonchi. Breathing is unlabored. Heart: Irregular. Musculoskeletal:  Strength and tone normal for age. Extremities/Skin: Warm and dry. Neuro: Alert and oriented X 3.  Moves all extremities spontaneously. Gait is normal. CNII-XII grossly in tact. Psych:  Responds to questions appropriately with a normal affect.     ASSESSMENT AND PLAN:  81 y.o. year old female with  1. Long term current use of anticoagulant therapy - PT with INR/Fingerstick INR 2.0.  Continue current dose of Coumadin with no change. Recheck PT/ INR 4 weeks.   Chronic atrial fibrillation (HCC)  Insomnia Will try trazodone for insomnia. Told her to call if this causes any adverse effects or is ineffective. - traZODone (DESYREL) 50 MG tablet; Take 1 tablet (50 mg total) by mouth at bedtime as needed for sleep.  Dispense: 30 tablet; Refill: 3   Signed, [address] [place], [place], [facility] 10/23/2015 10:00 AM

## 2015-10-24 DIAGNOSIS — R0600 Dyspnea, unspecified: Secondary | ICD-10-CM | POA: Diagnosis not present

## 2015-10-25 DIAGNOSIS — F5104 Psychophysiologic insomnia: Secondary | ICD-10-CM | POA: Diagnosis not present

## 2015-10-25 DIAGNOSIS — J449 Chronic obstructive pulmonary disease, unspecified: Secondary | ICD-10-CM | POA: Diagnosis not present

## 2015-10-25 DIAGNOSIS — R0600 Dyspnea, unspecified: Secondary | ICD-10-CM | POA: Diagnosis not present

## 2015-10-25 DIAGNOSIS — I5021 Acute systolic (congestive) heart failure: Secondary | ICD-10-CM | POA: Diagnosis not present

## 2015-10-27 ENCOUNTER — Telehealth: Payer: Self-pay | Admitting: Cardiology

## 2015-10-27 NOTE — Telephone Encounter (Signed)
Patient called with many complaints she st all started at the beginning of the year. She reports "breathing wrong" every morning. She says she wakes up and cannot catch her breath. [name] complains of chest tightness in the AM. The only thing that makes the tightness subside is squeezing a pillow. She st she has taken nitro for it a couple times and "it doesn't really work." She also complains of LE swelling. She st she "can only wear her biggest shoes." She has not slept since d/c from the hospital in January.  She went to a walk-in clinic this weekend and was told to consult with her PCP and cardiologist. She has OV with her PCP tomorrow. Patient wants to see Dr. [name] today. Informed her Dr. [name] is not in the office this week, but she could be seen by a PA or NP.  Per patient request, scheduled her 2/23 with [name]. She refused to see any other provider or even consider the ED. She st "the last few times I have been to the hospital, nothing was done." Instructed patient to call 911 if symptoms worsen prior to appointment.

## 2015-10-27 NOTE — Telephone Encounter (Signed)
New Message   Pt said she cant sleep at night and and she cant get up in the morning  She refused PA appt and wants a RN to call her

## 2015-10-28 ENCOUNTER — Encounter: Payer: Self-pay | Admitting: Family Medicine

## 2015-10-28 ENCOUNTER — Telehealth: Payer: Self-pay | Admitting: *Deleted

## 2015-10-28 ENCOUNTER — Ambulatory Visit (INDEPENDENT_AMBULATORY_CARE_PROVIDER_SITE_OTHER): Payer: Medicare Other | Admitting: Family Medicine

## 2015-10-28 ENCOUNTER — Encounter: Payer: Self-pay | Admitting: *Deleted

## 2015-10-28 VITALS — BP 144/82 | HR 60 | Temp 98.6°F | Resp 18 | Wt 230.0 lb

## 2015-10-28 DIAGNOSIS — I482 Chronic atrial fibrillation, unspecified: Secondary | ICD-10-CM

## 2015-10-28 DIAGNOSIS — I5032 Chronic diastolic (congestive) heart failure: Secondary | ICD-10-CM | POA: Insufficient documentation

## 2015-10-28 DIAGNOSIS — R600 Localized edema: Secondary | ICD-10-CM | POA: Diagnosis not present

## 2015-10-28 DIAGNOSIS — G4700 Insomnia, unspecified: Secondary | ICD-10-CM | POA: Diagnosis not present

## 2015-10-28 DIAGNOSIS — I5022 Chronic systolic (congestive) heart failure: Secondary | ICD-10-CM

## 2015-10-28 DIAGNOSIS — K219 Gastro-esophageal reflux disease without esophagitis: Secondary | ICD-10-CM

## 2015-10-28 DIAGNOSIS — R079 Chest pain, unspecified: Secondary | ICD-10-CM

## 2015-10-28 LAB — CBC WITH DIFFERENTIAL/PLATELET
BASOS ABS: 0 10*3/uL (ref 0.0–0.1)
BASOS PCT: 0 % (ref 0–1)
Eosinophils Absolute: 0.1 10*3/uL (ref 0.0–0.7)
Eosinophils Relative: 2 % (ref 0–5)
HCT: 38.7 % (ref 36.0–46.0)
HEMOGLOBIN: 12.2 g/dL (ref 12.0–15.0)
Lymphocytes Relative: 30 % (ref 12–46)
Lymphs Abs: 1.9 10*3/uL (ref 0.7–4.0)
MCH: 28.8 pg (ref 26.0–34.0)
MCHC: 31.5 g/dL (ref 30.0–36.0)
MCV: 91.5 fL (ref 78.0–100.0)
MPV: 9.8 fL (ref 8.6–12.4)
Monocytes Absolute: 0.7 10*3/uL (ref 0.1–1.0)
Monocytes Relative: 11 % (ref 3–12)
NEUTROS ABS: 3.6 10*3/uL (ref 1.7–7.7)
NEUTROS PCT: 57 % (ref 43–77)
Platelets: 243 10*3/uL (ref 150–400)
RBC: 4.23 MIL/uL (ref 3.87–5.11)
RDW: 14 % (ref 11.5–15.5)
WBC: 6.4 10*3/uL (ref 4.0–10.5)

## 2015-10-28 LAB — COMPREHENSIVE METABOLIC PANEL
ALBUMIN: 4 g/dL (ref 3.6–5.1)
ALT: 20 U/L (ref 6–29)
AST: 26 U/L (ref 10–35)
Alkaline Phosphatase: 79 U/L (ref 33–130)
BILIRUBIN TOTAL: 0.8 mg/dL (ref 0.2–1.2)
BUN: 11 mg/dL (ref 7–25)
CO2: 32 mmol/L — AB (ref 20–31)
CREATININE: 0.7 mg/dL (ref 0.60–0.93)
Calcium: 9.8 mg/dL (ref 8.6–10.4)
Chloride: 101 mmol/L (ref 98–110)
Glucose, Bld: 99 mg/dL (ref 70–99)
Potassium: 3.8 mmol/L (ref 3.5–5.3)
SODIUM: 141 mmol/L (ref 135–146)
TOTAL PROTEIN: 7.4 g/dL (ref 6.1–8.1)

## 2015-10-28 MED ORDER — DEXLANSOPRAZOLE 60 MG PO CPDR: 60.0000 mg | DELAYED_RELEASE_CAPSULE | Freq: Every day | ORAL | Status: DC

## 2015-10-28 NOTE — Patient Instructions (Signed)
Lasix TAKE 40MG  twice a day for 3 days  Follow up with heart doctor Try the dexilant for stomach We will call with lab results  F/U pending results

## 2015-10-28 NOTE — Progress Notes (Signed)
Patient ID: [name], female   DOB: November 06, 1934, 81 y.o.   MRN: [id number]    Subjective:    Patient ID: [name], female    DOB: 11-19-1934, 81 y.o.   MRN: [id number]  Patient presents for Insomnia and Chest Pain  patient a follow-up chest pain. She's actually had chest pain on and off for the past 3 months. She states that she gets it in the mid epigastric region and sometimes it comes up to the top of the chest she's also sore across her abdominal region which is where she was pointing to. It is worse when she gets up to exert herself. She was actually hospitalized back in November that time she had non-ST elevated MI she had a catheterization done which showed nonobstructive coronary artery disease she also has underlying chronic atrial fibrillation as well as congestive heart failure. Diagnoses of toxicity versus syndrome also given. She's had difficulty with her blood pressure with a past mother said she's had multiple medication changes. She was tried on indoor tell with her chronic chest pain but she had allergic reaction to this. She does take nitroglycerin as needed and took a few this weekend. She went to an urgent care because her discomfort was worse they did an EKG which showed her atrial fibrillation and told her she had increased fluid. Her weight is actually up 4 pounds from her visit last week. She did take Lasix 60 mg on Saturday and is taking 40 mg the past 2 days her typical doses 20 mg once a day. She does admit to some GERD-like symptoms underlying as well and states that she is just not been able to sleep because of discomfort. She actually wanted me to change her sleeping meds to see if this would help.    Review Of Systems: per above   GEN- denies fatigue, fever, weight loss,weakness, recent illness HEENT- denies eye drainage, change in vision, nasal discharge, CVS- + chest pain, palpitations RESP- denies SOB, cough, wheeze ABD- denies N/V, change in stools, +abd pain GU-  denies dysuria, hematuria, dribbling, incontinence MSK- denies joint pain, muscle aches, injury Neuro- denies headache, dizziness, syncope, seizure activity       Objective:    BP 144/82 mmHg  Pulse 60  Temp(Src) 98.6 F (37 C) (Oral)  Resp 18  Wt 230 lb (104.327 kg)  SpO2 96% GEN- NAD, alert and oriented x3,weight up 4lbs  HEENT- PERRL, EOMI, non injected sclera, pink conjunctiva, MMM, oropharynx clear Neck- Supple, no JVD  CVS- irregular rhythem, normal rate  no murmur Chest wall- TTP lower sternum RESP-CTAB ABD-NABS,soft,TTP epigastric and LUQ, ND EXT- 1+ edema Pulses- Radial, 2+        Assessment & Plan:      Problem List Items Addressed This Visit    Insomnia    Recently placed on trazodone, recommend she continue this      GERD (gastroesophageal reflux disease)    Given Dexilant to try instead of prilosec       Relevant Medications   dexlansoprazole (DEXILANT) 60 MG capsule   Chronic systolic congestive heart failure (HCC)    EF 35-40%, fluid retention, Increase lasix to 40mg  BID for 3 days      Chronic atrial fibrillation (HCC)    Unchanged in setting of other cardiac issues Rate controlled        Other Visit Diagnoses    Chest pain, unspecified    -  Primary    She has  cardiac history but her pain is difficult to understand, it seems MSK on exam, but also has some GI component.? relief with NTG    Relevant Orders    EKG 12-Lead (Completed)    CBC with Differential/Platelet    Comprehensive metabolic panel    Pro b natriuretic peptide       Note: This dictation was prepared with Dragon dictation along with smaller phrase technology. Any transcriptional errors that result from this process are unintentional.

## 2015-10-28 NOTE — Assessment & Plan Note (Signed)
EF 35-40%, fluid retention, Increase lasix to 40mg  BID for 3 days

## 2015-10-28 NOTE — Assessment & Plan Note (Signed)
Unchanged in setting of other cardiac issues Rate controlled

## 2015-10-28 NOTE — Telephone Encounter (Signed)
Patient returned call and made aware.  ? ?Verbalized understanding.  ?

## 2015-10-28 NOTE — Telephone Encounter (Signed)
Received note from MD to F/U with pt in regards to PPI. MD recommends stopping Prilosec and trying samples of Dexilant.  Call placed to patient.   [place].

## 2015-10-28 NOTE — Assessment & Plan Note (Signed)
Recently placed on trazodone, recommend she continue this

## 2015-10-28 NOTE — Assessment & Plan Note (Signed)
Given Dexilant to try instead of prilosec

## 2015-10-29 LAB — PRO B NATRIURETIC PEPTIDE: PRO B NATRI PEPTIDE: 1030 pg/mL — AB (ref <451)

## 2015-10-30 ENCOUNTER — Encounter: Payer: Self-pay | Admitting: Cardiology

## 2015-10-30 ENCOUNTER — Ambulatory Visit: Payer: Medicare Other | Admitting: Cardiology

## 2015-10-30 ENCOUNTER — Ambulatory Visit (INDEPENDENT_AMBULATORY_CARE_PROVIDER_SITE_OTHER): Payer: Medicare Other | Admitting: Cardiology

## 2015-10-30 VITALS — BP 150/68 | HR 53 | Ht 66.5 in | Wt 221.0 lb

## 2015-10-30 DIAGNOSIS — R0602 Shortness of breath: Secondary | ICD-10-CM | POA: Diagnosis not present

## 2015-10-30 DIAGNOSIS — I10 Essential (primary) hypertension: Secondary | ICD-10-CM | POA: Diagnosis not present

## 2015-10-30 DIAGNOSIS — I255 Ischemic cardiomyopathy: Secondary | ICD-10-CM

## 2015-10-30 LAB — BASIC METABOLIC PANEL
BUN: 9 mg/dL (ref 7–25)
CHLORIDE: 100 mmol/L (ref 98–110)
CO2: 29 mmol/L (ref 20–31)
Calcium: 9.6 mg/dL (ref 8.6–10.4)
Creat: 0.8 mg/dL (ref 0.60–0.93)
Glucose, Bld: 96 mg/dL (ref 65–99)
POTASSIUM: 3.7 mmol/L (ref 3.5–5.3)
SODIUM: 142 mmol/L (ref 135–146)

## 2015-10-30 MED ORDER — HYDRALAZINE HCL 25 MG PO TABS: 25.0000 mg | ORAL_TABLET | Freq: Three times a day (TID) | ORAL | Status: DC

## 2015-10-30 MED ORDER — FUROSEMIDE 40 MG PO TABS: 40.0000 mg | ORAL_TABLET | Freq: Two times a day (BID) | ORAL | Status: DC

## 2015-10-30 NOTE — Patient Instructions (Signed)
Medication Instructions:  Your physician has recommended you make the following change in your medication:  1.  INCREASE the Lasix to 40 mg taking 1 tablet twice a day 2.  INCREASE the Hydralazine to 25 mg taking 1 tablet three times a day   Labwork: TODAY:  BMET & BNP  Testing/Procedures: None ordered  Follow-Up: Your physician recommends that you keep your scheduled follow-up appointment AS SCHEDULED   Any Other Special Instructions Will Be [place]  If you need a refill on your cardiac medications before your next appointment, please call your pharmacy.

## 2015-10-30 NOTE — Progress Notes (Signed)
10/30/2015 [name]   1935-02-23  [id number]  Primary Physician Karis Juba, PA-C Primary Cardiologist: Dr. [name]   Reason for Visit/CC: DOE, LEE and abdominal discomfort  HPI:  81 y/o female, followed by Dr. [name], with h/o HTN and systolic CHF who presents to clinic with a complaint of DOE, LEE and abdominal discomfort. In 07/2015, she was admitted to [facility] for NSTEMI, however subsequent LHC showed nonobstructive CAD but moderately reduced LVEF of 35-40% c/w Takotsubo cardiomyopathy. She was treated with an ARB, BB, Nitrate + hydralazine and Lasix. Her Imdur had to be discontinued due to intolerance (rash).   She was seen by her PCP 2 days ago with similar complaints. She was felt to be in acute on chronic systolic HF, based on symptoms and evidence of peripheral edema on exam. Her PCP increased her lasix to 40 mg BID. She was advised to f/u in our office for further recommendations.   She notes improvement in LEE since increasing her lasix. She still has mild/ trace LEE and mild DOE. No resting dyspnea. Her normal weight is 219 lb. Her weight today is 221 lb. She reports full medication compliance and avoidance of sodium. She denies CP but notes abdominal discomfort/ distention.      Current Outpatient Prescriptions  Medication Sig Dispense Refill  . acetaminophen (TYLENOL) 325 MG tablet Take 650 mg by mouth every 6 (six) hours as needed for mild pain.    Marland Kitchen albuterol (PROVENTIL HFA;VENTOLIN HFA) 108 (90 Base) MCG/ACT inhaler Inhale 2 puffs into the lungs every 6 (six) hours as needed for wheezing or shortness of breath. 1 Inhaler 0  . alum & mag hydroxide-simeth (MAALOX/MYLANTA) 200-200-20 MG/5ML suspension Take 30 mLs by mouth every 6 (six) hours as needed for indigestion or heartburn.    Marland Kitchen amLODipine (NORVASC) 10 MG tablet Take 1 tablet (10 mg total) by mouth daily. 30 tablet 3  . Cholecalciferol (VITAMIN D) 2000 UNITS CAPS Take 1 capsule by mouth daily as needed (for  supplementation).     . cloNIDine (CATAPRES) 0.1 MG tablet Take 1 tablet (0.1 mg total) by mouth 2 (two) times daily. 60 tablet 3  . dexlansoprazole (DEXILANT) 60 MG capsule Take 1 capsule (60 mg total) by mouth daily. 30 capsule 0  . diphenhydrAMINE (BENADRYL) 25 MG tablet Take 25 mg by mouth every 8 (eight) hours as needed (Bee sting).    . hydrALAZINE (APRESOLINE) 10 MG tablet Take 1 tablet (10 mg total) by mouth 3 (three) times daily. 90 tablet 5  . losartan (COZAAR) 100 MG tablet Take 1 tablet (100 mg total) by mouth daily. 90 tablet 3  . metoprolol succinate (TOPROL-XL) 50 MG 24 hr tablet Take 1 tablet (50 mg total) by mouth daily. Take with or immediately following a meal. 90 tablet 3  . nitroGLYCERIN (NITROSTAT) 0.4 MG SL tablet Place 1 tablet (0.4 mg total) under the tongue every 5 (five) minutes as needed for chest pain (x 3 pills). 30 tablet 2  . omeprazole (PRILOSEC) 20 MG capsule Take 1 capsule (20 mg total) by mouth daily as needed (heartburn, acid reflux). 90 capsule 3  . OVER THE COUNTER MEDICATION Place 1 drop into both eyes daily as needed (dry eyes). Eye drops    . simvastatin (ZOCOR) 10 MG tablet Take 10 mg by mouth daily as needed (choloesterol.).    Marland Kitchen traZODone (DESYREL) 50 MG tablet Take 1 tablet (50 mg total) by mouth at bedtime as needed for sleep. 30 tablet 3  .  triamcinolone ointment (KENALOG) 0.1 % Apply 1 application topically daily as needed.   0  . warfarin (COUMADIN) 1 MG tablet Take 0.5 mg by mouth as directed. M-W-F-SA  0  . warfarin (COUMADIN) 5 MG tablet Take 0.5 tablets (2.5 mg total) by mouth See admin instructions. Take half a tablet (2.5 mg) on Tuesdays, Thursdays, and Sundays. Take half a tablet with half a tablet of 1 mg (total 3 mg) on all other days. (Patient taking differently: Take 2.5 mg by mouth See admin instructions. Take half a tablet (2.5 mg) on Tuesdays, Thursdays, and Sundays. Take half a tablet with half a tablet of 1 mg (total 3 mg) on all other  days) 90 tablet 3  . furosemide (LASIX) 20 MG tablet Take 2 tablets (40 mg total) by mouth daily. 14 tablet 0   No current facility-administered medications for this visit.    Allergies  Allergen Reactions  . Contrast Media [Iodinated Diagnostic Agents] Other (See Comments) and Hives    Per pt strong burning sensation starting in chest radiating outward Per pt strong burning sensation starting in chest radiating outward  . Clonidine Derivatives Other (See Comments)    Throat dry  . Statins Other (See Comments)    "bones hurt"  . Celebrex [Celecoxib] Rash  . Isosorbide Itching and Rash  . Other Itching and Rash    Plastic and paper tape  . Tape Itching and Rash    Red Where applied and will spread    Social History   Social History  . Marital Status: Married    Spouse Name: N/A  . Number of Children: N/A  . Years of Education: N/A   Occupational History  . Not on file.   Social History Main Topics  . Smoking status: Never Smoker   . Smokeless tobacco: Never Used  . Alcohol Use: No  . Drug Use: No  . Sexual Activity: Not Currently   Other Topics Concern  . Not on file   Social History Narrative     Review of Systems: General: negative for chills, fever, night sweats or weight changes.  Cardiovascular: negative for chest pain, dyspnea on exertion, edema, orthopnea, palpitations, paroxysmal nocturnal dyspnea or shortness of breath Dermatological: negative for rash Respiratory: negative for cough or wheezing Urologic: negative for hematuria Abdominal: negative for nausea, vomiting, diarrhea, bright red blood per rectum, melena, or hematemesis Neurologic: negative for visual changes, syncope, or dizziness All other systems reviewed and are otherwise negative except as noted above.    Blood pressure 150/68, pulse 53, height 5' 6.5" (1.689 m), weight 221 lb (100.245 kg).  General appearance: alert, cooperative and no distress Neck: no carotid bruit and no  JVD Lungs: clear to auscultation bilaterally Heart: irregularly irregular rhythm Extremities: trace LEE Pulses: 2+ and symmetric Skin: warm and dry Neurologic: Grossly normal  EKG atrial fibrillation w/ SVR 53 bpm   ASSESSMENT AND PLAN:   1. Acute on Chronic Systolic CHF: H/o Takotsubo cardiomyoapthy with EF of 35-40% in 07/2015. She has had improvement with increased dose of Lasix, but still with mild peripheral edema, dyspnea on exertion and abdominal fullness. Her weight is also up 4 lbs. She is in no respiratory disteress. We will continue her on the higher dose of Lasix, 40 mg BID. We will recheck a BMET today to ensure renal function and K are ok. She is to keep her f/u with Dr. [name] in March. Low sodium diet and daily weights recommended.  She is to  continue ARB, and BB. We will increase her hydralazine to 25 mg TID. If no improvement, [place] need to consider addition of aldactone if stable renal function and K. She is intolerant to Imdur.   2. HTN: BP moderately elevated at 150/68. Will increase hydralazine to 25 mg BID, given systolic CHF.    [name] PA-C 10/30/2015 10:52 AM

## 2015-10-31 LAB — BRAIN NATRIURETIC PEPTIDE: BRAIN NATRIURETIC PEPTIDE: 117.9 pg/mL — AB (ref <100)

## 2015-11-17 ENCOUNTER — Ambulatory Visit (HOSPITAL_COMMUNITY): Payer: Medicare Other | Attending: Cardiology

## 2015-11-17 ENCOUNTER — Other Ambulatory Visit: Payer: Self-pay

## 2015-11-17 DIAGNOSIS — I429 Cardiomyopathy, unspecified: Secondary | ICD-10-CM | POA: Diagnosis present

## 2015-11-17 DIAGNOSIS — I371 Nonrheumatic pulmonary valve insufficiency: Secondary | ICD-10-CM | POA: Diagnosis not present

## 2015-11-17 DIAGNOSIS — I509 Heart failure, unspecified: Secondary | ICD-10-CM | POA: Insufficient documentation

## 2015-11-17 DIAGNOSIS — I340 Nonrheumatic mitral (valve) insufficiency: Secondary | ICD-10-CM | POA: Diagnosis not present

## 2015-11-17 DIAGNOSIS — I350 Nonrheumatic aortic (valve) stenosis: Secondary | ICD-10-CM | POA: Insufficient documentation

## 2015-11-17 DIAGNOSIS — I77810 Thoracic aortic ectasia: Secondary | ICD-10-CM | POA: Insufficient documentation

## 2015-11-17 DIAGNOSIS — I272 Other secondary pulmonary hypertension: Secondary | ICD-10-CM | POA: Insufficient documentation

## 2015-11-17 DIAGNOSIS — I110 Hypertensive heart disease with heart failure: Secondary | ICD-10-CM | POA: Diagnosis not present

## 2015-11-17 DIAGNOSIS — I255 Ischemic cardiomyopathy: Secondary | ICD-10-CM | POA: Insufficient documentation

## 2015-11-17 DIAGNOSIS — E785 Hyperlipidemia, unspecified: Secondary | ICD-10-CM | POA: Diagnosis not present

## 2015-11-17 LAB — ECHOCARDIOGRAM COMPLETE
AOASC: 39 mm
AV Mean grad: 6 mmHg
AV Peak grad: 12 mmHg
AV vel: 2.1
Ao pk vel: 0.63 m/s
CHL CUP AORTIC ROOT 2D: 40 mm
CHL CUP AV PEAK INDEX: 0.9
CHL CUP AV VALUE AREA INDEX: 0.95
CHL CUP DOP CALC LVOT VTI: 32.6 cm
CHL CUP SV INDEX: 21.7 mL/m2
CHL CUP TV PEAK RV-RA GRADIENT: 57 cm/s
DOP CAL AO MEAN VELOCITY: 117 cm/s
EERAT: 17.99
ESTIMATED CVP: 8 mm HG
EWDT: 227 ms
FS: 34 % (ref 28–44)
IVS/LV PW RATIO, ED: 1.18
LA SIZE INDEX: 2.17 mm/m2
LA VOL 2D INDEX: 43 mL/m2
LA VOL 2D: 95 mL
LASIZE: 48 mm
LAVOL: 78 mL
LAVOLIN: 35.3 mL/m2
LDCA: 3.14 cm2
LEFT ATRIUM END SYS DIAM: 48 mm
LV PW d: 9.55 mm — AB (ref 0.6–1.1)
LV PW s: 9.55 mm
LV SIMPSON'S DISK: 71
LV TDI E'LATERAL: 8.34 cm/s
LV dias vol index: 31 mL/m2
LV dias vol: 68 mL (ref 46–106)
LV sys vol index: 9 mL/m2
LV sys vol: 68 mL — AB (ref 14–42)
LVIDD: 46.9 mm — AB (ref 3.5–6.0)
LVIDS: 30.8 mm — AB (ref 2.1–4.0)
LVOT MEAN VEL: 76.8 cm/s
LVOT SV INDEX: 46 mL/m2
LVOT peak VTI: 0.67 cm
LVOTD: 20 mm
LVOTPV: 111 cm/s
LVOTSV: 102 mL
MV Dec: 227 ms
MV Peak grad: 9 mmHg
MV pk A vel: 50.7 cm/s
MV pk E vel: 150 cm/s
Single Plane A4C EF: 82 %
Stroke v: 48 ml
TDI e' medial: 9.04 cm/s
TRMAXVEL: 376 cm/s
VTI: 48.8 cm
Valve area: 2.1 cm2

## 2015-11-18 ENCOUNTER — Telehealth: Payer: Self-pay | Admitting: Cardiology

## 2015-11-18 DIAGNOSIS — I272 Pulmonary hypertension, unspecified: Secondary | ICD-10-CM

## 2015-11-18 NOTE — Telephone Encounter (Signed)
Follow up:  Returning call from yesterday,she does not know who it was.

## 2015-11-18 NOTE — Telephone Encounter (Signed)
-----   Message from [name], MD sent at 11/17/2015  8:40 PM EDT ----- Patient has refused sleep study in the past but please explain to her that BP in lungs is getting highre and need to rule out OSA

## 2015-11-18 NOTE — Telephone Encounter (Signed)
Informed patient of results and verbal understanding expressed.  Patient agrees to sleep study, but only if scheduled out further than one month (she st her husband is having surgery Friday and she is waiting on him). Sleep study ordered for scheduling. Patient agrees with treatment plan.

## 2015-12-02 ENCOUNTER — Encounter: Payer: Self-pay | Admitting: Cardiology

## 2015-12-02 NOTE — Progress Notes (Signed)
Cardiology Office Note   Date:  12/03/2015   ID:  [name], DOB Raczynski [name], [name], [name]  PCP:  Karis Juba, PA-C    Chief Complaint  Patient presents with  . Coronary Artery Disease  . Congestive Heart Failure      History of Present Illness: [name] is a 81 y.o. female with a history of CAD, HTN, chronic diastolic CHF, dyslipidemia, mild pulmonary HTN, mild AS, mildly dilated aortic root and chronic AF.  She is doing well. She denies any chest pain or pressure, dizziness, palpitations or syncope. She has chronic SOB which is stable.  She complains of a tight band around her upper abdomen and lower chest under her breasts.  It is worse at night.  She denies any belching or sour taste in her mouth.  The discomfort comes and goes and can last a few days at a time.  She has not tried any antacids.  She has some LE edema intermittently.  She states that she feels her heart race in the am when she gets up.     Past Medical History  Diagnosis Date  . Cancer [facility])     right colon and left breast  . Breast cancer ([place]) 01/06/2012  . Colon cancer ([place]) 01/06/2012  . Hernia   . GERD (gastroesophageal reflux disease)   . Hypertension   . Allergy     rhinitis  . Osteopenia   . Diverticulosis   . Pneumonia     hx child  . Hiatal hernia     denies  . Arthritis   . Coronary artery disease 2006    nonobstructive with 20-30% ostial LAD and LM  . Atrial fibrillation     chronic atrial fibrillation  . Edema extremities   . Hyperlipidemia   . COPD (chronic obstructive pulmonary disease) ([place])   . Dilated aortic root ([place])   . Pulmonary HTN ([place])     moderate to severe PASP 34mmHg echo 11/2015  . Takotsubo syndrome [name]/22/2016    a. EF 35-40% by echo; akinesis of mid-apical anteroseptal and apical myocardium.  EF now normalized on echo 11/2015  . Mild aortic stenosis     echo 11/2015  . Chronic diastolic CHF (congestive heart failure) [facility])     Past Surgical  History  Procedure Laterality Date  . Colectomy      right side  . Breast surgery      lumpectomy left  . Eye surgery Bilateral 12    cataracts  . Appendectomy    . Cholecystectomy    . Excision of accessory nipple Bilateral 05/30/2013    Procedure: BILATERAL NIPPLE BIOPSY;  Surgeon: Merrie Roof, MD;  Location: [facility];  Service: General;  Laterality: Bilateral;  . Mastectomy partial / lumpectomy  2008    left breast  . Cardiac catheterization    . Cardiac catheterization N/A [name]/21/2016    Procedure: Left Heart Cath and Coronary Angiography;  Surgeon: Peter M Martinique, MD;  Location: [place];  Service: Cardiovascular;  Laterality: N/A;     Current Outpatient Prescriptions  Medication Sig Dispense Refill  . acetaminophen (TYLENOL) 325 MG tablet Take 650 mg by mouth every 6 (six) hours as needed for mild pain.    Marland Kitchen albuterol (PROVENTIL HFA;VENTOLIN HFA) 108 (90 Base) MCG/ACT inhaler Inhale 2 puffs into the lungs every 6 (six) hours as needed  for wheezing or shortness of breath. 1 Inhaler 0  . alum & mag hydroxide-simeth (MAALOX/MYLANTA) 200-200-20 MG/5ML suspension Take 30 mLs by mouth every 6 (six) hours as needed for indigestion or heartburn.    Marland Kitchen amLODipine (NORVASC) 10 MG tablet Take 1 tablet (10 mg total) by mouth daily. 30 tablet 3  . Cholecalciferol (VITAMIN D) 2000 UNITS CAPS Take 1 capsule by mouth daily as needed (for supplementation).     . cloNIDine (CATAPRES) 0.1 MG tablet Take 1 tablet (0.1 mg total) by mouth 2 (two) times daily. 60 tablet 3  . dexlansoprazole (DEXILANT) 60 MG capsule Take 1 capsule (60 mg total) by mouth daily. 30 capsule 0  . diphenhydrAMINE (BENADRYL) 25 MG tablet Take 25 mg by mouth every 8 (eight) hours as needed (Bee sting).    . hydrALAZINE (APRESOLINE) 25 MG tablet Take 1 tablet (25 mg total) by mouth 3 (three) times daily. 270 tablet 3  . losartan (COZAAR) 100 MG tablet Take 1 tablet (100 mg total) by mouth daily. 90 tablet 3  .  metoprolol succinate (TOPROL-XL) 50 MG 24 hr tablet Take 1 tablet (50 mg total) by mouth daily. Take with or immediately following a meal. 90 tablet 3  . nitroGLYCERIN (NITROSTAT) 0.4 MG SL tablet Place 1 tablet (0.4 mg total) under the tongue every 5 (five) minutes as needed for chest pain (x 3 pills). 30 tablet 2  . omeprazole (PRILOSEC) 20 MG capsule Take 1 capsule (20 mg total) by mouth daily as needed (heartburn, acid reflux). 90 capsule 3  . OVER THE COUNTER MEDICATION Place 1 drop into both eyes daily as needed (dry eyes). Eye drops    . simvastatin (ZOCOR) 10 MG tablet Take 10 mg by mouth daily as needed (choloesterol.).    Marland Kitchen traZODone (DESYREL) 50 MG tablet Take 1 tablet (50 mg total) by mouth at bedtime as needed for sleep. 30 tablet 3  . triamcinolone ointment (KENALOG) 0.1 % Apply 1 application topically daily as needed.   0  . warfarin (COUMADIN) 1 MG tablet Take 0.5 mg by mouth as directed. M-W-F-SA  0  . warfarin (COUMADIN) 5 MG tablet Take 0.5 tablets (2.5 mg total) by mouth See admin instructions. Take half a tablet (2.5 mg) on Tuesdays, Thursdays, and Sundays. Take half a tablet with half a tablet of 1 mg (total 3 mg) on all other days. (Patient taking differently: Take 2.5 mg by mouth See admin instructions. Take half a tablet (2.5 mg) on Tuesdays, Thursdays, and Sundays. Take half a tablet with half a tablet of 1 mg (total 3 mg) on all other days) 90 tablet 3  . furosemide (LASIX) 40 MG tablet Take 1 tablet (40 mg total) by mouth 2 (two) times daily. 360 tablet 0   No current facility-administered medications for this visit.    Allergies:   Contrast media; Clonidine derivatives; Statins; Celebrex; Isosorbide nitrate; Other; and Tape    Social History:  The patient  reports that she has never smoked. She has never used smokeless tobacco. She reports that she does not drink alcohol or use illicit drugs.   Family History:  The patient's family history includes Cancer in her  sister and sister; Heart attack in her mother; Heart disease in her mother; Heart disease (age of onset: 63) in her brother; Hypertension in her father. There is no history of Stroke.    ROS:  Please see the history of present illness.   Otherwise, review of systems are positive  for none.   All other systems are reviewed and negative.    PHYSICAL EXAM: VS:  BP 172/68 mmHg  Pulse 64  Ht 5\' 7"  (1.702 m)  Wt 221 lb 12.8 oz (100.608 kg)  BMI 34.73 kg/m2 , BMI Body mass index is 34.73 kg/(m^2). GEN: Well nourished, well developed, in no acute distress HEENT: normal Neck: no JVD, carotid bruits, or masses Cardiac: RRR; no  rubs, or gallops.  Trace edema L>R. 2/6 SM at RUSB Respiratory:  clear to auscultation bilaterally, normal work of breathing GI: soft, nontender, nondistended, + BS MS: no deformity or atrophy Skin: warm and dry, no rash Neuro:  Strength and sensation are intact Psych: euthymic mood, full affect   EKG:  EKG is not ordered today.    Recent Labs: 09/22/2015: B Natriuretic Peptide 118.1* 10/28/2015: ALT 20; Hemoglobin 12.2; Platelets 243; Pro B Natriuretic peptide (BNP) 1030.00* 10/30/2015: BUN 9; Creat 0.80; Potassium 3.7; Sodium 142    Lipid Panel    Component Value Date/Time   CHOL 144 [name]/22/2016 0405   TRIG 73 [name]/22/2016 0405   HDL 49 [name]/22/2016 0405   CHOLHDL 2.9 [name]/22/2016 0405   VLDL 15 [name]/22/2016 0405   LDLCALC 80 [name]/22/2016 0405      Wt Readings from Last 3 Encounters:  12/03/15 221 lb 12.8 oz (100.608 kg)  10/30/15 221 lb (100.245 kg)  10/28/15 230 lb (104.327 kg)     ASSESSMENT AND PLAN:   1. ASCAD - she complains of a tight band around her upper abdomen and chest of ? Etiology.  I will check a BNp to make sure this is not volume overload. She has refused Lexiscan myoview in the past but I have recommended that she reconsider to try to make sure her symptoms are not due to ischemia.   - Not on ASA due to warfarin 2. HTN - poorly  controlled - continue Cozaar/BB/Hydralazine/amlodipine - increase clonidine to 0.2mg  BID - HTN clinic in 1 week 3. Dyslipidemia - LDL was not at goal in November 2016 (80) - continue simvastatin due to cost issues.  - recheck FLP and ALT in [place] 4. Chronic atrial fibrillation - continue warfarin//BB 5. Chronic anticoagulation 6. Chronic diastolic CHF - appears compensated but she is complaining of tightness in hier upper abdomen and chest and LE edema.  I will check a BNP.  She needs aggressive control of BP.   - continue Lasix/BB/ARB 7. Moderate to severe pulmonary HTN most likely secondary to diastolic CHF and COPD. Chest CT showed no pulmonary embolism. PFTs showed mild COPD. Patient refused sleep study in past but now set up for [place].  Repeat echo in 6 months.  I will check a BNP today and also refer to Dr. Aundra Dubin for evaluation of pulmonary HTN   9. Mild AS on echo 10. Asymptomatic bradycardia   Current medicines are reviewed at length with the patient today.  The patient does not have concerns regarding medicines.  The following changes have been made:  no change  Labs/ tests ordered today: See above Assessment and Plan No orders of the defined types were placed in this encounter.     Disposition:   FU with me in 6 months  Signed, [name], MD  12/03/2015 10:03 AM    [facility] [address], [place], [place]  [address] Phone: [phone]; Fax: [phone]

## 2015-12-03 ENCOUNTER — Ambulatory Visit (INDEPENDENT_AMBULATORY_CARE_PROVIDER_SITE_OTHER): Payer: Medicare Other | Admitting: Cardiology

## 2015-12-03 VITALS — BP 172/68 | HR 64 | Ht 67.0 in | Wt 221.8 lb

## 2015-12-03 DIAGNOSIS — I2583 Coronary atherosclerosis due to lipid rich plaque: Principal | ICD-10-CM

## 2015-12-03 DIAGNOSIS — I272 Other secondary pulmonary hypertension: Secondary | ICD-10-CM

## 2015-12-03 DIAGNOSIS — I482 Chronic atrial fibrillation, unspecified: Secondary | ICD-10-CM

## 2015-12-03 DIAGNOSIS — I5032 Chronic diastolic (congestive) heart failure: Secondary | ICD-10-CM | POA: Diagnosis not present

## 2015-12-03 DIAGNOSIS — E785 Hyperlipidemia, unspecified: Secondary | ICD-10-CM

## 2015-12-03 DIAGNOSIS — I2510 Atherosclerotic heart disease of native coronary artery without angina pectoris: Secondary | ICD-10-CM

## 2015-12-03 DIAGNOSIS — I77810 Thoracic aortic ectasia: Secondary | ICD-10-CM

## 2015-12-03 DIAGNOSIS — I10 Essential (primary) hypertension: Secondary | ICD-10-CM

## 2015-12-03 DIAGNOSIS — R079 Chest pain, unspecified: Secondary | ICD-10-CM | POA: Diagnosis not present

## 2015-12-03 LAB — BASIC METABOLIC PANEL
BUN: 12 mg/dL (ref 7–25)
CHLORIDE: 101 mmol/L (ref 98–110)
CO2: 28 mmol/L (ref 20–31)
CREATININE: 0.9 mg/dL (ref 0.60–0.93)
Calcium: 9.9 mg/dL (ref 8.6–10.4)
Glucose, Bld: 99 mg/dL (ref 65–99)
Potassium: 4.2 mmol/L (ref 3.5–5.3)
Sodium: 140 mmol/L (ref 135–146)

## 2015-12-03 LAB — BRAIN NATRIURETIC PEPTIDE: Brain Natriuretic Peptide: 122 pg/mL — ABNORMAL HIGH (ref <100)

## 2015-12-03 MED ORDER — CLONIDINE HCL 0.2 MG PO TABS: 0.2000 mg | ORAL_TABLET | Freq: Two times a day (BID) | ORAL | Status: DC

## 2015-12-03 NOTE — Patient Instructions (Signed)
Medication Instructions:  Your physician has recommended you make the following change in your medication: 1) INCREASE CLONIDINE to 0.2 mg TWICE DAILY  Labwork: TODAY: BMET, BNP  Your physician recommends that you return for FASTING lab work in [place] 2017. (LFTs, Lipids)  Testing/Procedures: Your physician has requested that you have a lexiscan myoview in about one week. For further information please visit [email or web address]. Please follow instruction sheet, as given.  Follow-Up: Your physician recommends that you schedule a follow-up appointment with Gay Filler in the HTN Clinic the same day as your stress test.  You have been referred to Dr. Aundra Dubin for evaluation of pulmonary HTN.  Your physician wants you to follow-up in: 6 months with Dr. [name]. You will receive a reminder letter in the mail two months in advance. If you don't receive a letter, please call our office to schedule the follow-up appointment.    Any Other Special Instructions Will Be Listed Below (If Applicable).     If you need a refill on your cardiac medications before your next appointment, please call your pharmacy.

## 2015-12-04 ENCOUNTER — Telehealth: Payer: Self-pay | Admitting: Cardiology

## 2015-12-04 NOTE — Telephone Encounter (Signed)
New message   Pt is calling because she is unclear on the instructions that was given

## 2015-12-04 NOTE — Telephone Encounter (Signed)
Patient called because she could not remember why she is seeing Gay Filler, [facility] next week. Informed her she will have her blood pressure checked after medication changes Dr. [name] made yesterday and to make more changes if necessary. Patient was grateful for call.

## 2015-12-04 NOTE — Telephone Encounter (Signed)
Attempted to call patient x2. Line was busy.  Will try again later. 

## 2015-12-05 ENCOUNTER — Other Ambulatory Visit: Payer: Self-pay | Admitting: *Deleted

## 2015-12-05 DIAGNOSIS — I5032 Chronic diastolic (congestive) heart failure: Secondary | ICD-10-CM

## 2015-12-05 DIAGNOSIS — I10 Essential (primary) hypertension: Secondary | ICD-10-CM

## 2015-12-05 DIAGNOSIS — R0602 Shortness of breath: Secondary | ICD-10-CM

## 2015-12-09 ENCOUNTER — Telehealth (HOSPITAL_COMMUNITY): Payer: Self-pay | Admitting: *Deleted

## 2015-12-09 NOTE — Telephone Encounter (Signed)
Patient attempted to contact for instructions for nuclear test. No answer and answer machine ask for remote access code.[name], [name]

## 2015-12-10 ENCOUNTER — Telehealth: Payer: Self-pay | Admitting: Physician Assistant

## 2015-12-10 DIAGNOSIS — I4891 Unspecified atrial fibrillation: Secondary | ICD-10-CM

## 2015-12-10 NOTE — Telephone Encounter (Signed)
Pt needs a refill of Warfarin sent in to the [facility] on [address].

## 2015-12-11 ENCOUNTER — Ambulatory Visit (INDEPENDENT_AMBULATORY_CARE_PROVIDER_SITE_OTHER): Payer: Medicare Other | Admitting: Pharmacist

## 2015-12-11 ENCOUNTER — Ambulatory Visit (HOSPITAL_COMMUNITY): Payer: Medicare Other | Attending: Cardiology

## 2015-12-11 ENCOUNTER — Other Ambulatory Visit (INDEPENDENT_AMBULATORY_CARE_PROVIDER_SITE_OTHER): Payer: Medicare Other | Admitting: *Deleted

## 2015-12-11 ENCOUNTER — Encounter (HOSPITAL_COMMUNITY): Payer: Medicare Other

## 2015-12-11 VITALS — BP 152/62 | HR 59

## 2015-12-11 DIAGNOSIS — R0602 Shortness of breath: Secondary | ICD-10-CM

## 2015-12-11 DIAGNOSIS — I10 Essential (primary) hypertension: Secondary | ICD-10-CM

## 2015-12-11 DIAGNOSIS — I5032 Chronic diastolic (congestive) heart failure: Secondary | ICD-10-CM | POA: Diagnosis not present

## 2015-12-11 DIAGNOSIS — R079 Chest pain, unspecified: Secondary | ICD-10-CM

## 2015-12-11 DIAGNOSIS — R9439 Abnormal result of other cardiovascular function study: Secondary | ICD-10-CM | POA: Insufficient documentation

## 2015-12-11 DIAGNOSIS — R0609 Other forms of dyspnea: Secondary | ICD-10-CM | POA: Diagnosis not present

## 2015-12-11 LAB — BASIC METABOLIC PANEL
BUN: 26 mg/dL — AB (ref 7–25)
CALCIUM: 9.4 mg/dL (ref 8.6–10.4)
CO2: 28 mmol/L (ref 20–31)
CREATININE: 1.1 mg/dL — AB (ref 0.60–0.93)
Chloride: 100 mmol/L (ref 98–110)
GLUCOSE: 110 mg/dL — AB (ref 65–99)
Potassium: 4.5 mmol/L (ref 3.5–5.3)
Sodium: 136 mmol/L (ref 135–146)

## 2015-12-11 LAB — MYOCARDIAL PERFUSION IMAGING
CHL CUP NUCLEAR SSS: 5
CSEPPHR: 59 {beats}/min
LV dias vol: 133 mL (ref 46–106)
LVSYSVOL: 51 mL
NUC STRESS TID: 1
RATE: 0.26
Rest HR: 44 {beats}/min
SDS: 2
SRS: 3

## 2015-12-11 LAB — BRAIN NATRIURETIC PEPTIDE: BRAIN NATRIURETIC PEPTIDE: 132 pg/mL — AB (ref <100)

## 2015-12-11 MED ORDER — TECHNETIUM TC 99M SESTAMIBI GENERIC - CARDIOLITE
10.2000 | Freq: Once | INTRAVENOUS | Status: AC | PRN
  Administered 2015-12-11: 10 via INTRAVENOUS

## 2015-12-11 MED ORDER — HYDRALAZINE HCL 50 MG PO TABS: 50.0000 mg | ORAL_TABLET | Freq: Two times a day (BID) | ORAL | Status: DC

## 2015-12-11 MED ORDER — TECHNETIUM TC 99M SESTAMIBI GENERIC - CARDIOLITE
31.5000 | Freq: Once | INTRAVENOUS | Status: AC | PRN
  Administered 2015-12-11: 32 via INTRAVENOUS

## 2015-12-11 MED ORDER — CLONIDINE HCL 0.1 MG PO TABS: 0.1000 mg | ORAL_TABLET | Freq: Two times a day (BID) | ORAL | Status: DC

## 2015-12-11 MED ORDER — REGADENOSON 0.4 MG/5ML IV SOLN
0.4000 mg | Freq: Once | INTRAVENOUS | Status: AC
  Administered 2015-12-11: 0.4 mg via INTRAVENOUS

## 2015-12-11 NOTE — Progress Notes (Signed)
Pt seen by myself and Bennye Alm, PharmD.  Agree with below.

## 2015-12-11 NOTE — Patient Instructions (Signed)
Decrease your clonidine to 0.1mg  (1/2 tablet) twice a day.  This should help improve your dry mouth.   Increase your hydralazine to 50mg  (2 tablets) twice daily.   Continue to check your blood pressure once a day and write the numbers down.  If your heart rate is less than 50, please call Gay Filler at [phone].    Try to cut down on the fast food hamburgers and french fries.  Also try caffeine free soft drinks.    We will recheck your blood pressure in 2 weeks.

## 2015-12-11 NOTE — Progress Notes (Signed)
Patient ID:                  DOB:                          MRN:     HPI: [name] is a 81 y.o. female referred by Dr. [name] to HTN clinic. Her PMH includes CAD, HTN, chronic diastolic CHF, takotsubo syndrome, dyslipidemia, mild pulmonary HTN, mild AS, mildly dilated aortic root and chronic AF.  She denies dizziness, headache, lightheadedness or orthostasis. She reports that she is only able to take hydralazine 25 mg BID due to forgetting the third dose (prescribed TID).  She also reports she has experienced increased dry mouth on the clonidine 0.2 mg BID dose. She does report that she did have one HR in the 40s at home but had no symptoms associated with it and it did increase a few minutes later upon repeat check. She does report some chest discomfort/tightness at night but states that it is relieved with mylanta and tums.   Current HTN meds: Amlodipine 10 mg daily, Losartan 100 mg daily, metoprolol succinate 50 mg daily, clonidine 0.2 mg BID, hydralazine 25 mg BID (prescribed TID), furosemide 40 mg BID.  Previously tried: Isosorbide (Intolerance - Itching) BP goal: <150/90  Family History:  family history includes Cancer in her sister and sister; Heart attack in her mother; Heart disease in her mother; Heart disease (age of onset: 75) in her brother; Hypertension in her father. There is no history of Stroke.   Social History:   reports that she has never smoked. She has never used smokeless tobacco. She reports that she does not drink alcohol or use illicit drugs.  Diet:  Breakfast- bacon and toast, egg. No coffee Doesn't add salt to food.  Reports eating fast foods 2-3x/week.  Drinks diet cokes daily.   Exercise: Isnt able to do much walking at the present time.   Home BP readings: Checks BP at home with Arm cuff but did not bring in meter or log. Reports most recent BP 147/40  Wt Readings from Last 3 Encounters:  12/03/15 221 lb 12.8 oz (100.608 kg)  10/30/15 221 lb (100.245  kg)  10/28/15 230 lb (104.327 kg)   BP Readings from Last 3 Encounters:  12/11/15 152/62  12/03/15 172/68  10/30/15 150/68   Pulse Readings from Last 3 Encounters:  12/11/15 59  12/03/15 64  10/30/15 53    Renal function: Estimated Creatinine Clearance: 61.8 mL/min (by C-G formula based on Cr of 0.9).  Past Medical History  Diagnosis Date  . Cancer [facility])     right colon and left breast  . Breast cancer ([place]) 01/06/2012  . Colon cancer ([place]) 01/06/2012  . Hernia   . GERD (gastroesophageal reflux disease)   . Hypertension   . Allergy     rhinitis  . Osteopenia   . Diverticulosis   . Pneumonia     hx child  . Hiatal hernia     denies  . Arthritis   . Coronary artery disease 2006    nonobstructive with 20-30% ostial LAD and LM  . Atrial fibrillation     chronic atrial fibrillation  . Edema extremities   . Hyperlipidemia   . COPD (chronic obstructive pulmonary disease) ([place])   . Dilated aortic root ([place])   . Pulmonary HTN ([place])     moderate to severe PASP 48mmHg echo 11/2015  . Takotsubo syndrome  07/29/2015    a. EF 35-40% by echo; akinesis of mid-apical anteroseptal and apical myocardium.  EF now normalized on echo 11/2015  . Mild aortic stenosis     echo 11/2015  . Chronic diastolic CHF (congestive heart failure) ([place])     Current Outpatient Prescriptions on File Prior to Visit  Medication Sig Dispense Refill  . acetaminophen (TYLENOL) 325 MG tablet Take 650 mg by mouth every 6 (six) hours as needed for mild pain.    Marland Kitchen albuterol (PROVENTIL HFA;VENTOLIN HFA) 108 (90 Base) MCG/ACT inhaler Inhale 2 puffs into the lungs every 6 (six) hours as needed for wheezing or shortness of breath. 1 Inhaler 0  . alum & mag hydroxide-simeth (MAALOX/MYLANTA) 200-200-20 MG/5ML suspension Take 30 mLs by mouth every 6 (six) hours as needed for indigestion or heartburn.    Marland Kitchen amLODipine (NORVASC) 10 MG tablet Take 1 tablet (10 mg total) by mouth daily. 30 tablet 3  . Cholecalciferol (VITAMIN  D) 2000 UNITS CAPS Take 1 capsule by mouth daily as needed (for supplementation).     . diphenhydrAMINE (BENADRYL) 25 MG tablet Take 25 mg by mouth every 8 (eight) hours as needed (Bee sting).    Marland Kitchen losartan (COZAAR) 100 MG tablet Take 1 tablet (100 mg total) by mouth daily. 90 tablet 3  . metoprolol succinate (TOPROL-XL) 50 MG 24 hr tablet Take 1 tablet (50 mg total) by mouth daily. Take with or immediately following a meal. 90 tablet 3  . nitroGLYCERIN (NITROSTAT) 0.4 MG SL tablet Place 1 tablet (0.4 mg total) under the tongue every 5 (five) minutes as needed for chest pain (x 3 pills). 30 tablet 2  . omeprazole (PRILOSEC) 20 MG capsule Take 1 capsule (20 mg total) by mouth daily as needed (heartburn, acid reflux). 90 capsule 3  . OVER THE COUNTER MEDICATION Place 1 drop into both eyes daily as needed (dry eyes). Eye drops    . simvastatin (ZOCOR) 10 MG tablet Take 10 mg by mouth daily as needed (choloesterol.).    Marland Kitchen traZODone (DESYREL) 50 MG tablet Take 1 tablet (50 mg total) by mouth at bedtime as needed for sleep. 30 tablet 3  . triamcinolone ointment (KENALOG) 0.1 % Apply 1 application topically daily as needed.   0  . warfarin (COUMADIN) 1 MG tablet Take 0.5 mg by mouth as directed. M-W-F-SA  0  . warfarin (COUMADIN) 5 MG tablet Take 0.5 tablets (2.5 mg total) by mouth See admin instructions. Take half a tablet (2.5 mg) on Tuesdays, Thursdays, and Sundays. Take half a tablet with half a tablet of 1 mg (total 3 mg) on all other days. (Patient taking differently: Take 2.5 mg by mouth See admin instructions. Take half a tablet (2.5 mg) on Tuesdays, Thursdays, and Sundays. Take half a tablet with half a tablet of 1 mg (total 3 mg) on all other days) 90 tablet 3  . furosemide (LASIX) 40 MG tablet Take 1 tablet (40 mg total) by mouth 2 (two) times daily. 360 tablet 0   No current facility-administered medications on file prior to visit.    Allergies  Allergen Reactions  . Contrast Media  [Iodinated Diagnostic Agents] Other (See Comments) and Hives    Per pt strong burning sensation starting in chest radiating outward Per pt strong burning sensation starting in chest radiating outward  . Clonidine Derivatives Other (See Comments)    Throat dry  . Statins Other (See Comments)    "bones hurt"  . Celebrex [Celecoxib] Rash  .  Isosorbide Nitrate Itching and Rash  . Other Itching and Rash    Plastic and paper tape  . Tape Itching and Rash    Red Where applied and will spread     Assessment/Plan: 1. Hypertension: Currently slightly above goal of <150/90 at 152/62. Will decrease dose of clonidine to 0.1 mg BID due to dry mouth at higher dose.  Will increase hydralazine to 50 mg BID. Will follow-up BP in 2 weeks.  Encouraged patient to cut down on fast food hamburgers and french fries and to try caffeine free soft drinks. Instructed patient to check BP once daily and to bring BP log and meter to next visit. Instructed her to call clinic if she has another bradycardia episode with HR < 50.

## 2015-12-12 ENCOUNTER — Other Ambulatory Visit: Payer: Self-pay | Admitting: Family Medicine

## 2015-12-12 DIAGNOSIS — I4891 Unspecified atrial fibrillation: Secondary | ICD-10-CM

## 2015-12-12 MED ORDER — WARFARIN SODIUM 5 MG PO TABS: 2.5000 mg | ORAL_TABLET | ORAL | Status: DC

## 2015-12-12 NOTE — Telephone Encounter (Signed)
LMTCB

## 2015-12-12 NOTE — Telephone Encounter (Signed)
Pt called back and order clarified.

## 2015-12-15 ENCOUNTER — Telehealth: Payer: Self-pay | Admitting: Cardiology

## 2015-12-15 ENCOUNTER — Telehealth (HOSPITAL_COMMUNITY): Payer: Self-pay | Admitting: Vascular Surgery

## 2015-12-15 NOTE — Telephone Encounter (Signed)
Left pt message to make new pt appt 

## 2015-12-15 NOTE — Telephone Encounter (Signed)
Informed patient CHF Clinic called to schedule appointment and gave her appropriate phone number to call. Reviewed lab results with patient - see lab results for further details. Patient was grateful for call.

## 2015-12-15 NOTE — Telephone Encounter (Signed)
Follow Up ° °Pt is returning the call  °

## 2015-12-16 ENCOUNTER — Telehealth: Payer: Self-pay | Admitting: Cardiology

## 2015-12-16 NOTE — Telephone Encounter (Signed)
-----   Message from [name], MD sent at 12/15/2015  1:30 PM EDT ----- Please have her get in to see extender tomorrow to reassess volume status

## 2015-12-16 NOTE — Telephone Encounter (Signed)
Pt returned call to [place]-pls call [phone]

## 2015-12-16 NOTE — Telephone Encounter (Signed)
Attempted to schedule OV with patient, but she st she feels much better today. Her swelling is down and she "feels great." She refuses to schedule OV at this time, but understands and agrees to call back if symptoms worsen again to schedule OV. Patient was grateful for assistance.

## 2015-12-25 ENCOUNTER — Ambulatory Visit (INDEPENDENT_AMBULATORY_CARE_PROVIDER_SITE_OTHER): Payer: Medicare Other | Admitting: Pharmacist

## 2015-12-25 VITALS — BP 160/60 | HR 57 | Wt 220.5 lb

## 2015-12-25 DIAGNOSIS — I10 Essential (primary) hypertension: Secondary | ICD-10-CM

## 2015-12-25 NOTE — Progress Notes (Signed)
Patient ID:                  DOB:                          MRN:     HPI: [name] is a 81 y.o. female referred by Dr. [name] to HTN clinic. Her PMH includes CAD, HTN, chronic diastolic CHF, takotsubo syndrome, dyslipidemia, mild pulmonary HTN, mild AS, mildly dilated aortic root and chronic AF.  She denies dizziness, headache, lightheadedness or orthostasis. She did state her dry mouth is better with lower dose of clonidine.  She continues to complain of abdominal pain, similar to what she described at Dr. Theodosia Blender visit in March.  Ischemic work-up was negative.  Pt states this is a constant pain that has been going on for months.  Nothing improves or worsens the pain.  She did call the office earlier last week complaining of swelling.  She did not want to see an extender at that time.  She has some mild lower extremity edema today and states she feels like her abdomin is bloated.  She reports her home BP is 140-160s but has been as low as 130 since her last visit.   Current HTN meds: Amlodipine 10 mg daily, Losartan 100 mg daily, metoprolol succinate 50 mg daily, clonidine 0.1 mg BID, hydralazine 50 mg BID, furosemide 40 mg BID.  Previously tried: Isosorbide (Intolerance - Itching) BP goal: <150/90  Family History:  family history includes Cancer in her sister and sister; Heart attack in her mother; Heart disease in her mother; Heart disease (age of onset: 35) in her brother; Hypertension in her father. There is no history of Stroke.   Social History:   reports that she has never smoked. She has never used smokeless tobacco. She reports that she does not drink alcohol or use illicit drugs.  Diet:  Breakfast- bacon and toast, egg. No coffee Doesn't add salt to food.  Reports eating fast foods 2-3x/week.  Drinks diet cokes daily.   Exercise: Isnt able to do much walking at the present time.   Home BP readings: Checks BP at home with Arm cuff but did not bring in meter or log. Reports  most recent BP 147/40  Wt Readings from Last 3 Encounters:  12/25/15 220 lb 8 oz (100.018 kg)  12/03/15 221 lb 12.8 oz (100.608 kg)  10/30/15 221 lb (100.245 kg)   BP Readings from Last 3 Encounters:  12/25/15 160/60  12/11/15 152/62  12/03/15 172/68   Pulse Readings from Last 3 Encounters:  12/25/15 57  12/11/15 59  12/03/15 64    Renal function: Estimated Creatinine Clearance: 50.4 mL/min (by C-G formula based on Cr of 1.1).  Past Medical History  Diagnosis Date  . Cancer [facility])     right colon and left breast  . Breast cancer ([place]) 01/06/2012  . Colon cancer ([place]) 01/06/2012  . Hernia   . GERD (gastroesophageal reflux disease)   . Hypertension   . Allergy     rhinitis  . Osteopenia   . Diverticulosis   . Pneumonia     hx child  . Hiatal hernia     denies  . Arthritis   . Coronary artery disease 2006    nonobstructive with 20-30% ostial LAD and LM  . Atrial fibrillation     chronic atrial fibrillation  . Edema extremities   . Hyperlipidemia   . COPD (chronic  obstructive pulmonary disease) ([place])   . Dilated aortic root ([place])   . Pulmonary HTN ([place])     moderate to severe PASP 74mmHg echo 11/2015  . Takotsubo syndrome 07/29/2015    a. EF 35-40% by echo; akinesis of mid-apical anteroseptal and apical myocardium.  EF now normalized on echo 11/2015  . Mild aortic stenosis     echo 11/2015  . Chronic diastolic CHF (congestive heart failure) ([place])     Current Outpatient Prescriptions on File Prior to Visit  Medication Sig Dispense Refill  . acetaminophen (TYLENOL) 325 MG tablet Take 650 mg by mouth every 6 (six) hours as needed for mild pain.    Marland Kitchen albuterol (PROVENTIL HFA;VENTOLIN HFA) 108 (90 Base) MCG/ACT inhaler Inhale 2 puffs into the lungs every 6 (six) hours as needed for wheezing or shortness of breath. 1 Inhaler 0  . alum & mag hydroxide-simeth (MAALOX/MYLANTA) 200-200-20 MG/5ML suspension Take 30 mLs by mouth every 6 (six) hours as needed for indigestion or  heartburn.    Marland Kitchen amLODipine (NORVASC) 10 MG tablet Take 1 tablet (10 mg total) by mouth daily. 30 tablet 3  . Cholecalciferol (VITAMIN D) 2000 UNITS CAPS Take 1 capsule by mouth daily as needed (for supplementation).     . cloNIDine (CATAPRES) 0.1 MG tablet Take 1 tablet (0.1 mg total) by mouth 2 (two) times daily. 60 tablet 11  . diphenhydrAMINE (BENADRYL) 25 MG tablet Take 25 mg by mouth every 8 (eight) hours as needed (Bee sting).    . furosemide (LASIX) 40 MG tablet Take 1 tablet (40 mg total) by mouth 2 (two) times daily. 360 tablet 0  . hydrALAZINE (APRESOLINE) 50 MG tablet Take 1 tablet (50 mg total) by mouth 2 (two) times daily. 60 tablet 11  . losartan (COZAAR) 100 MG tablet Take 1 tablet (100 mg total) by mouth daily. 90 tablet 3  . metoprolol succinate (TOPROL-XL) 50 MG 24 hr tablet Take 1 tablet (50 mg total) by mouth daily. Take with or immediately following a meal. 90 tablet 3  . nitroGLYCERIN (NITROSTAT) 0.4 MG SL tablet Place 1 tablet (0.4 mg total) under the tongue every 5 (five) minutes as needed for chest pain (x 3 pills). 30 tablet 2  . omeprazole (PRILOSEC) 20 MG capsule Take 1 capsule (20 mg total) by mouth daily as needed (heartburn, acid reflux). 90 capsule 3  . OVER THE COUNTER MEDICATION Place 1 drop into both eyes daily as needed (dry eyes). Eye drops    . simvastatin (ZOCOR) 10 MG tablet Take 10 mg by mouth daily as needed (choloesterol.).    Marland Kitchen traZODone (DESYREL) 50 MG tablet Take 1 tablet (50 mg total) by mouth at bedtime as needed for sleep. 30 tablet 3  . triamcinolone ointment (KENALOG) 0.1 % Apply 1 application topically daily as needed.   0  . warfarin (COUMADIN) 1 MG tablet Take 0.5 mg by mouth as directed. M-W-F-SA  0  . warfarin (COUMADIN) 5 MG tablet Take 0.5 tablets (2.5 mg total) by mouth See admin instructions. Take half a tablet (2.5 mg) on Tuesdays, Thursdays, and Sundays. Take half a tablet with half a tablet of 1 mg (total 3 mg) on all other days. 90  tablet 3   No current facility-administered medications on file prior to visit.    Allergies  Allergen Reactions  . Contrast Media [Iodinated Diagnostic Agents] Other (See Comments) and Hives    Per pt strong burning sensation starting in chest radiating outward Per pt  strong burning sensation starting in chest radiating outward  . Clonidine Derivatives Other (See Comments)    Throat dry  . Statins Other (See Comments)    "bones hurt"  . Celebrex [Celecoxib] Rash  . Isosorbide Nitrate Itching and Rash  . Other Itching and Rash    Plastic and paper tape  . Tape Itching and Rash    Red Where applied and will spread     Assessment/Plan: 1. Hypertension: Currently slightly above goal of <150/90 at 160/60.  She reports her BP at home has been better.  Will not adjust medications at this time.  She did mention her BP was better controlled with Diovan but she had to stop taking this due to cost.  She will check with her insurance on cost to see if we could switch her off losartan and change to valsartan for better BP control.   2.  Edema- Pt's weight stable.  Slight pitting edema on exam.  She does have SOB but hard to get her to say if this is different than her baseline.  Will have her increase her furosemide to 80mg  BID x 3 days and will call her for follow up with telephone call on Monday to see if she feels any better.  Also instructed her to weigh every day until then.

## 2015-12-25 NOTE — Patient Instructions (Signed)
Increase furosemide to 80mg  twice a day for the next 3 days.  I will call you on Monday to see if you swelling has improved.   Continue your other medications.   Follow up with Heart Failure clinic in 2 weeks.

## 2015-12-30 ENCOUNTER — Other Ambulatory Visit: Payer: Self-pay | Admitting: Physician Assistant

## 2015-12-30 NOTE — Telephone Encounter (Signed)
Medication refilled per protocol. 

## 2015-12-31 ENCOUNTER — Encounter: Payer: Self-pay | Admitting: Physician Assistant

## 2015-12-31 ENCOUNTER — Ambulatory Visit (INDEPENDENT_AMBULATORY_CARE_PROVIDER_SITE_OTHER): Payer: Medicare Other | Admitting: Physician Assistant

## 2015-12-31 VITALS — BP 132/86 | HR 60 | Temp 98.1°F | Resp 18 | Wt 220.0 lb

## 2015-12-31 DIAGNOSIS — Z7901 Long term (current) use of anticoagulants: Secondary | ICD-10-CM | POA: Diagnosis not present

## 2015-12-31 DIAGNOSIS — R0981 Nasal congestion: Secondary | ICD-10-CM | POA: Diagnosis not present

## 2015-12-31 DIAGNOSIS — Z5181 Encounter for therapeutic drug level monitoring: Secondary | ICD-10-CM

## 2015-12-31 DIAGNOSIS — R0789 Other chest pain: Secondary | ICD-10-CM | POA: Diagnosis not present

## 2015-12-31 LAB — PT WITH INR/FINGERSTICK
INR, fingerstick: 2.6 — ABNORMAL HIGH (ref 0.80–1.20)
PT, fingerstick: 31.7 seconds — ABNORMAL HIGH (ref 10.4–12.5)

## 2015-12-31 NOTE — Progress Notes (Signed)
Patient ID: [name] MRN: [id number], DOB: September 10, 1934, 81 y.o. Date of Encounter: @DATE @  Chief Complaint:  Chief Complaint  Patient presents with  . c/o RUQ pain, stuffy rt side head  . PT/INR check    HPI: 81 y.o. year old white female  presents with above.   She has been taking her Coumadin as directed. No skipped doses. No bleeding.  She has been having discomfort in her right chest. Says that it feels sore there. No trauma or injury to the area. She has had her gallbladder removed in past.   Says that for the past 2 days the right side of her nose and stopped up. Not blowing much out of her nose--just that side feels congested. No fever, chills. No sore throat. No cough.    Past Medical History  Diagnosis Date  . Cancer [facility])     right colon and left breast  . Breast cancer ([place]) 01/06/2012  . Colon cancer ([place]) 01/06/2012  . Hernia   . GERD (gastroesophageal reflux disease)   . Hypertension   . Allergy     rhinitis  . Osteopenia   . Diverticulosis   . Pneumonia     hx child  . Hiatal hernia     denies  . Arthritis   . Coronary artery disease 2006    nonobstructive with 20-30% ostial LAD and LM  . Atrial fibrillation     chronic atrial fibrillation  . Edema extremities   . Hyperlipidemia   . COPD (chronic obstructive pulmonary disease) ([place])   . Dilated aortic root ([place])   . Pulmonary HTN ([place])     moderate to severe PASP 18mmHg echo 11/2015  . Takotsubo syndrome 07/29/2015    a. EF 35-40% by echo; akinesis of mid-apical anteroseptal and apical myocardium.  EF now normalized on echo 11/2015  . Mild aortic stenosis     echo 11/2015  . Chronic diastolic CHF (congestive heart failure) ([facility])      Home Meds: Outpatient Prescriptions Prior to Visit  Medication Sig Dispense Refill  . acetaminophen (TYLENOL) 325 MG tablet Take 650 mg by mouth every 6 (six) hours as needed for mild pain.    Marland Kitchen albuterol (PROVENTIL HFA;VENTOLIN HFA) 108 (90 Base) MCG/ACT inhaler  Inhale 2 puffs into the lungs every 6 (six) hours as needed for wheezing or shortness of breath. 1 Inhaler 0  . alum & mag hydroxide-simeth (MAALOX/MYLANTA) 200-200-20 MG/5ML suspension Take 30 mLs by mouth every 6 (six) hours as needed for indigestion or heartburn.    Marland Kitchen amLODipine (NORVASC) 10 MG tablet TAKE ONE TABLET BY MOUTH ONCE DAILY 90 tablet 1  . Cholecalciferol (VITAMIN D) 2000 UNITS CAPS Take 1 capsule by mouth daily as needed (for supplementation).     . cloNIDine (CATAPRES) 0.1 MG tablet Take 1 tablet (0.1 mg total) by mouth 2 (two) times daily. 60 tablet 11  . diphenhydrAMINE (BENADRYL) 25 MG tablet Take 25 mg by mouth every 8 (eight) hours as needed (Bee sting).    . hydrALAZINE (APRESOLINE) 50 MG tablet Take 1 tablet (50 mg total) by mouth 2 (two) times daily. 60 tablet 11  . losartan (COZAAR) 100 MG tablet Take 1 tablet (100 mg total) by mouth daily. 90 tablet 3  . metoprolol succinate (TOPROL-XL) 50 MG 24 hr tablet Take 1 tablet (50 mg total) by mouth daily. Take with or immediately following a meal. 90 tablet 3  . nitroGLYCERIN (NITROSTAT) 0.4 MG SL tablet Place  1 tablet (0.4 mg total) under the tongue every 5 (five) minutes as needed for chest pain (x 3 pills). 30 tablet 2  . omeprazole (PRILOSEC) 20 MG capsule Take 1 capsule (20 mg total) by mouth daily as needed (heartburn, acid reflux). 90 capsule 3  . OVER THE COUNTER MEDICATION Place 1 drop into both eyes daily as needed (dry eyes). Eye drops    . simvastatin (ZOCOR) 10 MG tablet Take 10 mg by mouth daily as needed (choloesterol.).    Marland Kitchen traZODone (DESYREL) 50 MG tablet Take 1 tablet (50 mg total) by mouth at bedtime as needed for sleep. 30 tablet 3  . triamcinolone ointment (KENALOG) 0.1 % Apply 1 application topically daily as needed.   0  . warfarin (COUMADIN) 1 MG tablet Take 0.5 mg by mouth as directed. M-W-F-SA  0  . warfarin (COUMADIN) 5 MG tablet Take 0.5 tablets (2.5 mg total) by mouth See admin instructions. Take half  a tablet (2.5 mg) on Tuesdays, Thursdays, and Sundays. Take half a tablet with half a tablet of 1 mg (total 3 mg) on all other days. 90 tablet 3  . furosemide (LASIX) 40 MG tablet Take 1 tablet (40 mg total) by mouth 2 (two) times daily. 360 tablet 0   No facility-administered medications prior to visit.    Allergies:  Allergies  Allergen Reactions  . Contrast Media [Iodinated Diagnostic Agents] Other (See Comments) and Hives    Per pt strong burning sensation starting in chest radiating outward Per pt strong burning sensation starting in chest radiating outward  . Clonidine Derivatives Other (See Comments)    Throat dry  . Statins Other (See Comments)    "bones hurt"  . Celebrex [Celecoxib] Rash  . Isosorbide Nitrate Itching and Rash  . Other Itching and Rash    Plastic and paper tape  . Tape Itching and Rash    Red Where applied and will spread    Social History   Social History  . Marital Status: Married    Spouse Name: N/A  . Number of Children: N/A  . Years of Education: N/A   Occupational History  . Not on file.   Social History Main Topics  . Smoking status: Never Smoker   . Smokeless tobacco: Never Used  . Alcohol Use: No  . Drug Use: No  . Sexual Activity: Not Currently   Other Topics Concern  . Not on file   Social History Narrative    Family History  Problem Relation Age of Onset  . Heart disease Mother   . Cancer Sister     stomach and colon  . Heart disease Brother 41  . Cancer Sister   . Heart attack Mother   . Hypertension Father   . Stroke Neg Hx      Review of Systems:  See HPI for pertinent ROS. All other ROS negative.    Physical Exam: Blood pressure 132/86, pulse 60, temperature 98.1 F (36.7 C), temperature source Oral, resp. rate 18, weight 220 lb (99.791 kg)., Body mass index is 34.45 kg/(m^2). General: Obese WF. Appears in no acute distress. Head: Normocephalic, atraumatic, eyes without discharge, sclera non-icteric, nares  are without discharge. Bilateral auditory canals clear, TM's are without perforation, pearly grey and translucent with reflective cone of light bilaterally. Oral cavity moist, posterior pharynx without exudate, erythema, peritonsillar abscess. No tenderness with percussion of maxillary sinuses bilaterally.   Neck: Supple. No thyromegaly. No lymphadenopathy. Lungs: Clear bilaterally to auscultation without wheezes,  rales, or rhonchi. Breathing is unlabored. Heart: RRR with S1 S2. No murmurs, rubs, or gallops. Abdomen: Soft,  non-distended with normoactive bowel sounds. No hepatomegaly. No rebound/guarding. No obvious abdominal masses. There is some tenderness with palpation of right upper quadrant.  Musculoskeletal:  Strength and tone normal for age. There is severe tenderness with palpation of right chest wall, lower part of right breast.  Extremities/Skin: Warm and dry. Neuro: Alert and oriented X 3. Moves all extremities spontaneously. Gait is normal. CNII-XII grossly in tact. Psych:  Responds to questions appropriately with a normal affect.     ASSESSMENT AND PLAN:  81 y.o. year old female with  1. Long term current use of anticoagulant therapy - PT with INR/Fingerstick  2. Monitoring for long-term anticoagulant use INR therapeutic at 2.6.  Continue current Coumadin dose. Recheck PT/INR 4 weeks.  3. Chest wall pain She has been complaining of chest pain epigastric pain for a long time and now is complaining of pain in the right chest wall. I reviewed her last office note by Dr. [name] cardiologist and even at that time patient reported to her feeling like a band wrapped around her chest underneath her breast. She had chest x-ray 09/22/15. She had CT abdomen and pelvis 09/11/15. She had CT chest 07/28/15. I reassured patient. Symptoms consistent with musculoskeletal pain.  4. Congestion of nasal sinus She is to take Zyrtec daily to improve these symptoms.    [address] [place],  [place], [facility] 12/31/2015 9:36 AM

## 2016-01-09 ENCOUNTER — Ambulatory Visit (HOSPITAL_COMMUNITY)
Admission: RE | Admit: 2016-01-09 | Discharge: 2016-01-09 | Disposition: A | Discharge status: Home / Self Care | Payer: Medicare Other | Source: Ambulatory Visit | Attending: Cardiology | Admitting: Cardiology

## 2016-01-09 VITALS — BP 170/66 | HR 62 | Wt 220.5 lb

## 2016-01-09 DIAGNOSIS — K219 Gastro-esophageal reflux disease without esophagitis: Secondary | ICD-10-CM | POA: Insufficient documentation

## 2016-01-09 DIAGNOSIS — E785 Hyperlipidemia, unspecified: Secondary | ICD-10-CM | POA: Diagnosis not present

## 2016-01-09 DIAGNOSIS — I110 Hypertensive heart disease with heart failure: Secondary | ICD-10-CM | POA: Diagnosis not present

## 2016-01-09 DIAGNOSIS — I5032 Chronic diastolic (congestive) heart failure: Secondary | ICD-10-CM | POA: Diagnosis not present

## 2016-01-09 DIAGNOSIS — I272 Other secondary pulmonary hypertension: Secondary | ICD-10-CM

## 2016-01-09 DIAGNOSIS — I482 Chronic atrial fibrillation, unspecified: Secondary | ICD-10-CM

## 2016-01-09 DIAGNOSIS — Z7901 Long term (current) use of anticoagulants: Secondary | ICD-10-CM | POA: Diagnosis not present

## 2016-01-09 DIAGNOSIS — Z8249 Family history of ischemic heart disease and other diseases of the circulatory system: Secondary | ICD-10-CM | POA: Insufficient documentation

## 2016-01-09 DIAGNOSIS — Z853 Personal history of malignant neoplasm of breast: Secondary | ICD-10-CM | POA: Diagnosis not present

## 2016-01-09 DIAGNOSIS — Z79899 Other long term (current) drug therapy: Secondary | ICD-10-CM | POA: Diagnosis not present

## 2016-01-09 DIAGNOSIS — I712 Thoracic aortic aneurysm, without rupture: Secondary | ICD-10-CM | POA: Diagnosis not present

## 2016-01-09 MED ORDER — VALSARTAN 160 MG PO TABS: 240.0000 mg | ORAL_TABLET | Freq: Every day | ORAL | Status: DC

## 2016-01-09 MED ORDER — FUROSEMIDE 40 MG PO TABS: ORAL_TABLET | ORAL | Status: DC

## 2016-01-09 MED ORDER — POTASSIUM CHLORIDE CRYS ER 20 MEQ PO TBCR: 20.0000 meq | EXTENDED_RELEASE_TABLET | Freq: Every day | ORAL | Status: DC

## 2016-01-09 MED ORDER — SPIRONOLACTONE 25 MG PO TABS: 12.5000 mg | ORAL_TABLET | Freq: Every day | ORAL | Status: DC

## 2016-01-09 NOTE — Patient Instructions (Signed)
Increase Furosemide (Lasix) to 80 mg (2 tabs) in AM and 40 mg (1 tab) in PM  Start Spironolactone 12.5 mg (1/2 tab) daily  Start Potassium 20 meq daily  Stop Losartan  Start Valsartan 240 mg (1 & 1/2 tabs) daily  Labs in 1 week  Please wear your Compression Hose daily, put them on first thing in the morning and remove before going to bed  Your physician recommends that you schedule a follow-up appointment in: 2 weeks

## 2016-01-10 ENCOUNTER — Encounter (HOSPITAL_BASED_OUTPATIENT_CLINIC_OR_DEPARTMENT_OTHER): Payer: Medicare Other

## 2016-01-11 NOTE — Progress Notes (Signed)
Patient ID: [name], female   DOB: August 17, 1935, 81 y.o.   MRN: [id number] PCP: Karis Juba Cardiology: [name] HF Cardiology: Aundra Dubin  81 yo with history of poorly controlled HTN, chronic atrial fibrillation, chronic diastolic CHF, and prior episode of Takotsubo cardiomyopathy presents for CHF clinic appointment. Patient has had exertional dyspnea since 11/16.  At that time, she presented to the hospital with chest pain.  Coronary angiography showed no significant CAD.  EF was 35-40% by echo, suspected Takotsubo cardiomyopathy.  Her last echo in 3/17 showed EF improved to 123456 but PA systolic pressure elevated at 65 mmHg.  She has had ongoing problems with exertional dyspnea and lower extremity edema.  She is short of breath after walking 50-100 feet and gets tightness under her breasts with similar exertion.  Dyspnea walking up an incline.  No orthopnea/PND.  She is on Lasix 40 mg bid.    ECG (2/17): atrial fibrillation  Labs (11/16): LDL 80 Labs (4/17): K 4.5, creatinine 1.1, BNP 132  PMH: 1. Hyperlipidemia 2. Chronic atrial fibrillation 3. HTN: Poor control.  4. H/o breast cancer: 2013.  5. GERD 6. Takotsubo cardiomyopathy: Admitted in 11/16 with chest pain.  Coronary angiography showed nonobstructive CAD.  Echo (11/16) showed EF 35-40%.   - Echo (3/17) with EF 60-65%, mild aortic stenosis, PA systolic pressure 65 mmHg, RV mildly dilated with normal systolic function.  - Cardiolite (4/17) with EF 61%, small reversible mid anteroseptal/apical lateral defects thought to be related to shifting breast artifact => low risk study.  7. Chronic diastolic CHF 8. Ascending aortic aneurysm: 4.3 cm by CTA in 11/16.  9. Pulmonary hypertension: PASP 65 mmHg by echo in 3/17.  CTA chest in 11/16 with no PE.  PFTs in 7/15 with mild obstructive lung disease.   Social History   Social History  . Marital Status: Married    Spouse Name: N/A  . Number of Children: N/A  . Years of Education: N/A    Occupational History  . Not on file.   Social History Main Topics  . Smoking status: Never Smoker   . Smokeless tobacco: Never Used  . Alcohol Use: No  . Drug Use: No  . Sexual Activity: Not Currently   Other Topics Concern  . Not on file   Social History Narrative   Family History  Problem Relation Age of Onset  . Heart disease Mother   . Cancer Sister     stomach and colon  . Heart disease Brother 72  . Cancer Sister   . Heart attack Mother   . Hypertension Father   . Stroke Neg Hx    ROS: All systems reviewed and negative except as per HPI.   Current Outpatient Prescriptions  Medication Sig Dispense Refill  . acetaminophen (TYLENOL) 325 MG tablet Take 650 mg by mouth every 6 (six) hours as needed for mild pain.    Marland Kitchen albuterol (PROVENTIL HFA;VENTOLIN HFA) 108 (90 Base) MCG/ACT inhaler Inhale 2 puffs into the lungs every 6 (six) hours as needed for wheezing or shortness of breath. 1 Inhaler 0  . alum & mag hydroxide-simeth (MAALOX/MYLANTA) 200-200-20 MG/5ML suspension Take 30 mLs by mouth every 6 (six) hours as needed for indigestion or heartburn.    Marland Kitchen amLODipine (NORVASC) 10 MG tablet TAKE ONE TABLET BY MOUTH ONCE DAILY 90 tablet 1  . Cholecalciferol (VITAMIN D) 2000 UNITS CAPS Take 1 capsule by mouth daily as needed (for supplementation).     . cloNIDine (CATAPRES) 0.1  MG tablet Take 1 tablet (0.1 mg total) by mouth 2 (two) times daily. 60 tablet 11  . diphenhydrAMINE (BENADRYL) 25 MG tablet Take 25 mg by mouth every 8 (eight) hours as needed (Bee sting).    . furosemide (LASIX) 40 MG tablet Take 2 tabs in AM and 1 tab in PM 90 tablet 3  . hydrALAZINE (APRESOLINE) 50 MG tablet Take 1 tablet (50 mg total) by mouth 2 (two) times daily. 60 tablet 11  . metoprolol succinate (TOPROL-XL) 50 MG 24 hr tablet Take 1 tablet (50 mg total) by mouth daily. Take with or immediately following a meal. 90 tablet 3  . nitroGLYCERIN (NITROSTAT) 0.4 MG SL tablet Place 1 tablet (0.4 mg  total) under the tongue every 5 (five) minutes as needed for chest pain (x 3 pills). 30 tablet 2  . omeprazole (PRILOSEC) 20 MG capsule Take 1 capsule (20 mg total) by mouth daily as needed (heartburn, acid reflux). 90 capsule 3  . OVER THE COUNTER MEDICATION Place 1 drop into both eyes daily as needed (dry eyes). Eye drops    . simvastatin (ZOCOR) 10 MG tablet Take 10 mg by mouth daily as needed (choloesterol.).    Marland Kitchen triamcinolone ointment (KENALOG) 0.1 % Apply 1 application topically daily as needed.   0  . warfarin (COUMADIN) 1 MG tablet Take 0.5 mg by mouth as directed. M-W-F-SA  0  . warfarin (COUMADIN) 5 MG tablet Take 0.5 tablets (2.5 mg total) by mouth See admin instructions. Take half a tablet (2.5 mg) on Tuesdays, Thursdays, and Sundays. Take half a tablet with half a tablet of 1 mg (total 3 mg) on all other days. 90 tablet 3  . potassium chloride SA (K-DUR,KLOR-CON) 20 MEQ tablet Take 1 tablet (20 mEq total) by mouth daily. 30 tablet 3  . spironolactone (ALDACTONE) 25 MG tablet Take 0.5 tablets (12.5 mg total) by mouth daily. 15 tablet 3  . valsartan (DIOVAN) 160 MG tablet Take 1.5 tablets (240 mg total) by mouth daily. 45 tablet 3   No current facility-administered medications for this encounter.   BP 170/66 mmHg  Pulse 62  Wt 220 lb 8 oz (100.018 kg)  SpO2 96% General: NAD Neck: JVP 8-9 cm, no thyromegaly or thyroid nodule.  Lungs: Crackles at bases bilaterally. CV: Nondisplaced PMI.  Heart regular S1/S2, no S3/S4, 2/6 early SEM RUSB.  1+ edema 1/2 up lower legs bilaterally.  No carotid bruit.  Normal pedal pulses.  Abdomen: Soft, nontender, no hepatosplenomegaly, no distention.  Skin: Intact without lesions or rashes.  Neurologic: Alert and oriented x 3.  Psych: Normal affect. Extremities: No clubbing or cyanosis.  HEENT: Normal.   Assessment/Plan: 1. Chronic atrial fibrillation: Rate-controlled on Toprol XL.  She is on warfarin.  2. HTN: Poorly controlled, Fischler contribute  to diastolic CHF.  She feels like BP was better when she was on valsartan than it is now that she is on losartan.  - Stop losartan, start valsartan 240 mg daily.  - Add spironolactone 12.5 daily, this medication is often useful with resistant hypertension.  3. Chronic diastolic CHF: Patient is volume overloaded on exam with NYHA class III symptoms.   - Increase Lasix to 80 qam/40 qpm and add KCl 20 daily.  - BMET/BNP in 1 week.  - Wear graded compression stockings during the day.  4. 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  However, I do think that it would be  reasonable to do RHC after she is more diuresed.  I will see her back in 2 weeks, and if volume is better we'll schedule RHC.   5. Suspected OSA: Plan for sleep study this month.   Followup in 2 wks.   Loralie Champagne 01/11/2016

## 2016-01-16 ENCOUNTER — Other Ambulatory Visit (INDEPENDENT_AMBULATORY_CARE_PROVIDER_SITE_OTHER): Payer: Medicare Other | Admitting: *Deleted

## 2016-01-16 DIAGNOSIS — E785 Hyperlipidemia, unspecified: Secondary | ICD-10-CM

## 2016-01-16 LAB — HEPATIC FUNCTION PANEL
ALT: 19 U/L (ref 6–29)
AST: 22 U/L (ref 10–35)
Albumin: 4.1 g/dL (ref 3.6–5.1)
Alkaline Phosphatase: 79 U/L (ref 33–130)
BILIRUBIN DIRECT: 0.1 mg/dL (ref <=0.2)
BILIRUBIN INDIRECT: 0.5 mg/dL (ref 0.2–1.2)
Total Bilirubin: 0.6 mg/dL (ref 0.2–1.2)
Total Protein: 7.4 g/dL (ref 6.1–8.1)

## 2016-01-16 LAB — LIPID PANEL
Cholesterol: 163 mg/dL (ref 125–200)
HDL: 53 mg/dL (ref >=46)
LDL CALC: 92 mg/dL (ref <130)
Total CHOL/HDL Ratio: 3.1 Ratio (ref <=5.0)
Triglycerides: 88 mg/dL (ref <150)
VLDL: 18 mg/dL (ref <30)

## 2016-01-20 ENCOUNTER — Telehealth: Payer: Self-pay | Admitting: Cardiology

## 2016-01-20 NOTE — Telephone Encounter (Signed)
-----   Message from [name], MD sent at 01/17/2016 10:56 PM EDT ----- Please find out if patient is taking her simvastatin daily and if so what dose

## 2016-01-20 NOTE — Telephone Encounter (Signed)
Returned call to pt.  She has been advised on what Dr. [name] wanted to know.   Sent message to TT for advice.

## 2016-01-20 NOTE — Telephone Encounter (Signed)
F/u  Pt returning RN phone call- lab work. Please call back and discuss.   

## 2016-01-22 ENCOUNTER — Telehealth: Payer: Self-pay

## 2016-01-22 ENCOUNTER — Telehealth: Payer: Self-pay | Admitting: Cardiology

## 2016-01-22 DIAGNOSIS — E785 Hyperlipidemia, unspecified: Secondary | ICD-10-CM

## 2016-01-22 NOTE — Telephone Encounter (Signed)
F/u  Pt returning phone call- lab results. Please call back and discuss.   

## 2016-01-22 NOTE — Telephone Encounter (Signed)
Patient st she has tried Crestor before and it caused her "bone problems." She also adamantly refuses to try Zetia.  Recall placed to have fasting lab work in 6 months.

## 2016-01-22 NOTE — Telephone Encounter (Signed)
-----   Message from Aris Georgia, [facility] sent at 01/20/2016  1:45 PM EDT ----- Pt has hx of CAD so LDL goal < 70 mg/dL.  Has statins listed as allergy.  Unsure which she has tried.  If she has not tried Crestor, would have her try 5mg  three days of the week to get a higher LDL % reduction.  If she is unwilling to try different statin, consider adding Zetia 10mg  daily.  Recheck labs in 3 months if medication changed.  If she prefers to stay with her current dose of simvastatin would recheck in 6 months.

## 2016-01-22 NOTE — Telephone Encounter (Signed)
[name] she called the sleep lab and was told that her sleep study is scheduled for 5/25, but that it has not been pre-approved. She does not want to do the study if it has not been approved. Informed her Romelle Starcher will give her a call for clarification.

## 2016-01-22 NOTE — Telephone Encounter (Signed)
Patient's insurance does not require that I precert.  Patient is aware that if she wants to know what her plan covers, she will need to call her insurance.  She stated verbal understanding. Stated that she was going to keep her appointment

## 2016-01-26 ENCOUNTER — Ambulatory Visit (HOSPITAL_COMMUNITY)
Admission: RE | Admit: 2016-01-26 | Discharge: 2016-01-26 | Disposition: A | Discharge status: Home / Self Care | Payer: Medicare Other | Source: Ambulatory Visit | Attending: Cardiology | Admitting: Cardiology

## 2016-01-26 ENCOUNTER — Other Ambulatory Visit: Payer: Medicare Other

## 2016-01-26 ENCOUNTER — Encounter (HOSPITAL_COMMUNITY): Payer: Self-pay

## 2016-01-26 VITALS — BP 130/80 | HR 61 | Wt 217.0 lb

## 2016-01-26 DIAGNOSIS — I5032 Chronic diastolic (congestive) heart failure: Secondary | ICD-10-CM

## 2016-01-26 DIAGNOSIS — E785 Hyperlipidemia, unspecified: Secondary | ICD-10-CM | POA: Insufficient documentation

## 2016-01-26 DIAGNOSIS — Z79899 Other long term (current) drug therapy: Secondary | ICD-10-CM | POA: Insufficient documentation

## 2016-01-26 DIAGNOSIS — I110 Hypertensive heart disease with heart failure: Secondary | ICD-10-CM | POA: Insufficient documentation

## 2016-01-26 DIAGNOSIS — Z7901 Long term (current) use of anticoagulants: Secondary | ICD-10-CM | POA: Insufficient documentation

## 2016-01-26 DIAGNOSIS — Z853 Personal history of malignant neoplasm of breast: Secondary | ICD-10-CM | POA: Insufficient documentation

## 2016-01-26 DIAGNOSIS — I712 Thoracic aortic aneurysm, without rupture: Secondary | ICD-10-CM | POA: Diagnosis not present

## 2016-01-26 DIAGNOSIS — I2510 Atherosclerotic heart disease of native coronary artery without angina pectoris: Secondary | ICD-10-CM | POA: Diagnosis not present

## 2016-01-26 DIAGNOSIS — I482 Chronic atrial fibrillation, unspecified: Secondary | ICD-10-CM

## 2016-01-26 LAB — BASIC METABOLIC PANEL
Anion gap: 8 (ref 5–15)
BUN: 26 mg/dL — AB (ref 6–20)
CO2: 28 mmol/L (ref 22–32)
Calcium: 10.4 mg/dL — ABNORMAL HIGH (ref 8.9–10.3)
Chloride: 105 mmol/L (ref 101–111)
Creatinine, Ser: 1.11 mg/dL — ABNORMAL HIGH (ref 0.44–1.00)
GFR calc Af Amer: 53 mL/min — ABNORMAL LOW (ref >60)
GFR, EST NON AFRICAN AMERICAN: 46 mL/min — AB (ref >60)
Glucose, Bld: 103 mg/dL — ABNORMAL HIGH (ref 65–99)
POTASSIUM: 4.4 mmol/L (ref 3.5–5.1)
Sodium: 141 mmol/L (ref 135–145)

## 2016-01-26 LAB — BRAIN NATRIURETIC PEPTIDE: B Natriuretic Peptide: 126.2 pg/mL — ABNORMAL HIGH (ref 0.0–100.0)

## 2016-01-26 MED ORDER — EZETIMIBE 10 MG PO TABS: 10.0000 mg | ORAL_TABLET | Freq: Every day | ORAL | Status: DC

## 2016-01-26 MED ORDER — VALSARTAN 320 MG PO TABS: 320.0000 mg | ORAL_TABLET | Freq: Every day | ORAL | Status: DC

## 2016-01-26 NOTE — Patient Instructions (Signed)
Stop Hydralazine  Increase Valsartan to 320 mg daily, we sent you in a new prescription for 320 mg tablets  Start Zetia 10 mg daily, we have sent you in a prescription for this and will work on it with your insurance company  Labs today  Labs in 2 weeks  Your physician recommends that you schedule a follow-up appointment in: 6 weeks

## 2016-01-26 NOTE — Progress Notes (Signed)
Patient ID: [name], female   DOB: 09-16-1934, 81 y.o.   MRN: [id number] PCP: Karis Juba Cardiology: [name] HF Cardiology: Aundra Dubin  81 yo with history of poorly controlled HTN, chronic atrial fibrillation, chronic diastolic CHF, and prior episode of Takotsubo cardiomyopathy presents for CHF clinic appointment. Patient has had exertional dyspnea since 11/16.  At that time, she presented to the hospital with chest pain.  Coronary angiography showed no significant CAD.  EF was 35-40% by echo, suspected Takotsubo cardiomyopathy.  Her last echo in 3/17 showed EF improved to 123456 but PA systolic pressure elevated at 65 mmHg.  She has had ongoing problems with exertional dyspnea and lower extremity edema.  At last appointment, I adjusted her BP meds and increased Lasix to 80 qam/40 qpm for volume overload.   Today, weight is down 3 lbs.  Breathing is better.  She still gets tightness under her breasts after walking "a long distance."  Generally not much problem on flat ground.  No orthopnea/PND.  She is off statins as she has had myalgias with multiple types.      Labs (11/16): LDL 80 Labs (4/17): K 4.5, creatinine 1.1, BNP 132 Labs (5/17): LDL 92  PMH: 1. Hyperlipidemia: Myalgias with atorvastatin, Crestor, simvastatin.  2. Chronic atrial fibrillation 3. HTN: Poor control.  4. H/o breast cancer: 2013.  5. GERD 6. Takotsubo cardiomyopathy: Admitted in 11/16 with chest pain.  Coronary angiography showed nonobstructive CAD.  Echo (11/16) showed EF 35-40%.   - Echo (3/17) with EF 60-65%, mild aortic stenosis, PA systolic pressure 65 mmHg, RV mildly dilated with normal systolic function.  - Cardiolite (4/17) with EF 61%, small reversible mid anteroseptal/apical lateral defects thought to be related to shifting breast artifact => low risk study.  7. Chronic diastolic CHF 8. Ascending aortic aneurysm: 4.3 cm by CTA in 11/16.  9. Pulmonary hypertension: PASP 65 mmHg by echo in 3/17.  CTA chest in  11/16 with no PE.  PFTs in 7/15 with mild obstructive lung disease.   Social History   Social History  . Marital Status: Married    Spouse Name: N/A  . Number of Children: N/A  . Years of Education: N/A   Occupational History  . Not on file.   Social History Main Topics  . Smoking status: Never Smoker   . Smokeless tobacco: Never Used  . Alcohol Use: No  . Drug Use: No  . Sexual Activity: Not Currently   Other Topics Concern  . Not on file   Social History Narrative   Family History  Problem Relation Age of Onset  . Heart disease Mother   . Cancer Sister     stomach and colon  . Heart disease Brother 78  . Cancer Sister   . Heart attack Mother   . Hypertension Father   . Stroke Neg Hx    ROS: All systems reviewed and negative except as per HPI.   Current Outpatient Prescriptions  Medication Sig Dispense Refill  . acetaminophen (TYLENOL) 325 MG tablet Take 650 mg by mouth every 6 (six) hours as needed for mild pain.    Marland Kitchen albuterol (PROVENTIL HFA;VENTOLIN HFA) 108 (90 Base) MCG/ACT inhaler Inhale 2 puffs into the lungs every 6 (six) hours as needed for wheezing or shortness of breath. 1 Inhaler 0  . alum & mag hydroxide-simeth (MAALOX/MYLANTA) 200-200-20 MG/5ML suspension Take 30 mLs by mouth every 6 (six) hours as needed for indigestion or heartburn.    Marland Kitchen amLODipine (NORVASC)  10 MG tablet TAKE ONE TABLET BY MOUTH ONCE DAILY 90 tablet 1  . Cholecalciferol (VITAMIN D) 2000 UNITS CAPS Take 1 capsule by mouth daily as needed (for supplementation).     . cloNIDine (CATAPRES) 0.1 MG tablet Take 1 tablet (0.1 mg total) by mouth 2 (two) times daily. 60 tablet 11  . furosemide (LASIX) 40 MG tablet Take 2 tabs in AM and 1 tab in PM 90 tablet 3  . metoprolol succinate (TOPROL-XL) 50 MG 24 hr tablet Take 1 tablet (50 mg total) by mouth daily. Take with or immediately following a meal. 90 tablet 3  . omeprazole (PRILOSEC) 20 MG capsule Take 1 capsule (20 mg total) by mouth daily as  needed (heartburn, acid reflux). 90 capsule 3  . OVER THE COUNTER MEDICATION Place 1 drop into both eyes daily as needed (dry eyes). Eye drops    . potassium chloride SA (K-DUR,KLOR-CON) 20 MEQ tablet Take 1 tablet (20 mEq total) by mouth daily. 30 tablet 3  . simvastatin (ZOCOR) 10 MG tablet Take 10 mg by mouth daily as needed (choloesterol.).    Marland Kitchen spironolactone (ALDACTONE) 25 MG tablet Take 0.5 tablets (12.5 mg total) by mouth daily. 15 tablet 3  . valsartan (DIOVAN) 320 MG tablet Take 1 tablet (320 mg total) by mouth daily. 30 tablet 6  . warfarin (COUMADIN) 1 MG tablet Take 0.5 mg by mouth as directed. M-W-F-SA  0  . warfarin (COUMADIN) 5 MG tablet Take 0.5 tablets (2.5 mg total) by mouth See admin instructions. Take half a tablet (2.5 mg) on Tuesdays, Thursdays, and Sundays. Take half a tablet with half a tablet of 1 mg (total 3 mg) on all other days. 90 tablet 3  . ezetimibe (ZETIA) 10 MG tablet Take 1 tablet (10 mg total) by mouth daily. 30 tablet 3  . nitroGLYCERIN (NITROSTAT) 0.4 MG SL tablet Place 1 tablet (0.4 mg total) under the tongue every 5 (five) minutes as needed for chest pain (x 3 pills). (Patient not taking: Reported on 01/26/2016) 30 tablet 2   No current facility-administered medications for this encounter.   BP 130/80 mmHg  Pulse 61  Wt 217 lb (98.431 kg)  SpO2 97% General: NAD Neck: JVP 7-8 cm, no thyromegaly or thyroid nodule.  Lungs: Crackles at bases bilaterally. CV: Nondisplaced PMI.  Heart irregular S1/S2, no S3/S4, 2/6 early SEM RUSB.  Trace ankle edema.  No carotid bruit.  Normal pedal pulses.  Abdomen: Soft, nontender, no hepatosplenomegaly, no distention.  Skin: Intact without lesions or rashes.  Neurologic: Alert and oriented x 3.  Psych: Normal affect. Extremities: No clubbing or cyanosis.  HEENT: Normal.   Assessment/Plan: 1. Chronic atrial fibrillation: Rate-controlled on Toprol XL.  She is on warfarin.  2. HTN: Better control now.  However, would  like to get off some of her pills if possible.  - I will have her stop hydralazine and increase valsartan to 360 mg daily. BMET/BNP today and repeat in 2 wks.  - Continue other antihypertensives.  3. Chronic diastolic CHF: Volume status looks much better compared to last appointment and she is symptomatically improved (NYHA class II).  - Continue Lasix 80 qam/40 qpm and KCl 20 daily.  - As above, check BMET/BNP today.  - Wear graded compression stockings during the day.  4. 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  Given clinical improvement, think we can hold off on RHC for now.  5. Suspected  OSA: Plan for sleep study this month.  6. Hyperlipidemia: She has been intolerant of statins.  I will start her on Zetia 10 mg daily.   Followup in 6 wks.   Loralie Champagne 01/26/2016

## 2016-01-26 NOTE — Progress Notes (Signed)
Advanced Heart Failure Medication Review by a Pharmacist  Does the patient  feel that his/her medications are working for him/her?  yes  Has the patient been experiencing any side effects to the medications prescribed?  no  Does the patient measure his/her own blood pressure or blood glucose at home?  yes   Does the patient have any problems obtaining medications due to transportation or finances?   no  Understanding of regimen: good Understanding of indications: good Potential of compliance: good Patient understands to avoid NSAIDs. Patient understands to avoid decongestants.  Issues to address at subsequent visits: None   Pharmacist comments:  [name] is a pleasant 81 yo F presenting with a current medication list. She reports good compliance with her regimen but does admit to only taking simvastatin sparingly 2/2 muscle pains/"mental fogginess" and states that this has happened with every statin she has ever tried. She also asked if she would be able to come off of any of her medications at any point. We discussed the indications for most of her medications and I did suggest that if her blood pressure is better controlled she [place] be able to come off of some of her antihypertensives eventually. She did not have any other medication-related questions or concerns for me at this time.   [name]. Velva Harman, PharmD, BCPS, CPP Clinical Pharmacist Pager: [phone] Phone: [phone] 01/26/2016 10:15 AM      Time with patient: 10 minutes Preparation and documentation time: 2 minutes Total time: 12 minutes

## 2016-01-29 ENCOUNTER — Ambulatory Visit (HOSPITAL_BASED_OUTPATIENT_CLINIC_OR_DEPARTMENT_OTHER): Payer: Medicare Other | Attending: Cardiology | Admitting: Cardiology

## 2016-01-29 VITALS — Ht 67.0 in | Wt 217.0 lb

## 2016-01-29 DIAGNOSIS — R0683 Snoring: Secondary | ICD-10-CM | POA: Diagnosis not present

## 2016-01-29 DIAGNOSIS — I481 Persistent atrial fibrillation: Secondary | ICD-10-CM | POA: Insufficient documentation

## 2016-01-29 DIAGNOSIS — I272 Other secondary pulmonary hypertension: Secondary | ICD-10-CM | POA: Insufficient documentation

## 2016-01-31 DIAGNOSIS — H2000 Unspecified acute and subacute iridocyclitis: Secondary | ICD-10-CM | POA: Diagnosis not present

## 2016-02-02 ENCOUNTER — Encounter (HOSPITAL_BASED_OUTPATIENT_CLINIC_OR_DEPARTMENT_OTHER): Payer: Self-pay | Admitting: Cardiology

## 2016-02-02 ENCOUNTER — Telehealth: Payer: Self-pay | Admitting: Cardiology

## 2016-02-02 HISTORY — PX: SPLIT NIGHT STUDY: SLE1000

## 2016-02-02 NOTE — Telephone Encounter (Signed)
Please let patient know that sleep study showed no significant sleep apnea.    

## 2016-02-02 NOTE — Procedures (Signed)
   Patient Name: [name], [name] MRN: [id number] Study Date: 01/29/2016 Gender: Female D.O.B: 05-Aug-1935 Age (years): 65 Referring Provider: Fransico Him MD, ABSM Interpreting Physician: Fransico Him MD, ABSM RPSGT: [place], Holly  Weight (lbs): 217 BMI: 34 Height (inches): 67 Neck Size: 14.50  CLINICAL INFORMATION Sleep Study Type: NPSG Indication for sleep study: Hypertension Epworth Sleepiness Score: 0  SLEEP STUDY TECHNIQUE As per the AASM Manual for the Scoring of Sleep and Associated Events v2.3 (April 2016) with a hypopnea requiring 4% desaturations. The channels recorded and monitored were frontal, central and occipital EEG, electrooculogram (EOG), submentalis EMG (chin), nasal and oral airflow, thoracic and abdominal wall motion, anterior tibialis EMG, snore microphone, electrocardiogram, and pulse oximetry.  MEDICATIONS Patient's medications include: reviewed in the chart. Medications self-administered by patient during sleep study : No sleep medicine administered.  SLEEP ARCHITECTURE The study was initiated at 10:06:32 PM and ended at 4:43:08 AM. Sleep onset time was 3.7 minutes and the sleep efficiency was reduced at 79.8%. The total sleep time was 316.5 minutes. Stage REM latency was prolonged at 317.0 minutes. The patient spent 12.80% of the night in stage N1 sleep, 78.67% in stage N2 sleep, 6.32% in stage N3 and 2.21% in REM. Alpha intrusion was absent. Supine sleep was 40.44%.  RESPIRATORY PARAMETERS The overall apnea/hypopnea index (AHI) was 4.0 per hour. There were 18 total apneas, including 16 obstructive, 2 central and 0 mixed apneas. There were 3 hypopneas and 52 RERAs. The AHI during Stage REM sleep was 25.7 per hour. AHI while supine was 7.0 per hour. The mean oxygen saturation was 93.72%. The minimum SpO2 during sleep was 90.00%. Soft snoring was noted during this study.  CARDIAC DATA The 2 lead EKG demonstrated atrial fibrillation. Other EKG  findings include: None.  LEG MOVEMENT DATA The total PLMS were 5 with a resulting PLMS index of 0.95. Associated arousal with leg movement index was 0.2 .  IMPRESSIONS - No significant obstructive sleep apnea occurred during this study (AHI = 4.0/h). - No significant central sleep apnea occurred during this study (CAI = 0.4/h). - No significant oxygen desaturation was noted during this study (Min O2 = 90.00%). - The patient snored with Soft snoring volume. - Atrial Fibrillation was noted during this study. - Clinically significant periodic limb movements did not occur during sleep. No significant associated arousals.  DIAGNOSIS - Snoring  RECOMMENDATIONS - Avoid alcohol, sedatives and other CNS depressants that [place]ins result in sleep apnea and disrupt normal sleep architecture. - Sleep hygiene should be reviewed to assess factors that [place]ine improve sleep quality. - Weight management and regular exercise should be initiated or continued if appropriate.   [place], American Board of Sleep Medicine  ELECTRONICALLY SIGNED ON:  02/02/2016, 2:47 PM [place] PH: [phone]   FX: [phone] [place]

## 2016-02-03 NOTE — Telephone Encounter (Signed)
Patient has been informed of sleep study results. Stated verbal understanding.

## 2016-02-04 DIAGNOSIS — H2000 Unspecified acute and subacute iridocyclitis: Secondary | ICD-10-CM | POA: Diagnosis not present

## 2016-02-09 ENCOUNTER — Other Ambulatory Visit (INDEPENDENT_AMBULATORY_CARE_PROVIDER_SITE_OTHER): Payer: Medicare Other | Admitting: *Deleted

## 2016-02-09 ENCOUNTER — Telehealth (HOSPITAL_COMMUNITY): Payer: Self-pay | Admitting: Pharmacist

## 2016-02-09 DIAGNOSIS — E785 Hyperlipidemia, unspecified: Secondary | ICD-10-CM

## 2016-02-09 LAB — BASIC METABOLIC PANEL
BUN: 30 mg/dL — ABNORMAL HIGH (ref 7–25)
CHLORIDE: 102 mmol/L (ref 98–110)
CO2: 25 mmol/L (ref 20–31)
CREATININE: 0.94 mg/dL — AB (ref 0.60–0.93)
Calcium: 9.2 mg/dL (ref 8.6–10.4)
Glucose, Bld: 124 mg/dL — ABNORMAL HIGH (ref 65–99)
Potassium: 4.2 mmol/L (ref 3.5–5.3)
SODIUM: 138 mmol/L (ref 135–146)

## 2016-02-09 NOTE — Telephone Encounter (Signed)
Zetia tier exception denied by [place] [employer] but [place] verified copay of $37/mo.   [name]. Velva Harman, PharmD, BCPS, CPP Clinical Pharmacist Pager: [phone] Phone: [phone] 02/09/2016 10:06 AM

## 2016-02-09 NOTE — Addendum Note (Signed)
Addended by: Eulis Foster on: 02/09/2016 09:51 AM   Modules accepted: Orders

## 2016-02-09 NOTE — Addendum Note (Signed)
Addended by: Eulis Foster on: 02/09/2016 09:50 AM   Modules accepted: Orders

## 2016-02-09 NOTE — Addendum Note (Signed)
Addended by: Eulis Foster on: 02/09/2016 09:49 AM   Modules accepted: Orders

## 2016-02-10 DIAGNOSIS — H2000 Unspecified acute and subacute iridocyclitis: Secondary | ICD-10-CM | POA: Diagnosis not present

## 2016-02-25 DIAGNOSIS — H02055 Trichiasis without entropian left lower eyelid: Secondary | ICD-10-CM | POA: Diagnosis not present

## 2016-03-11 ENCOUNTER — Other Ambulatory Visit: Payer: Medicare Other

## 2016-03-11 DIAGNOSIS — I4891 Unspecified atrial fibrillation: Secondary | ICD-10-CM

## 2016-03-11 LAB — PT WITH INR/FINGERSTICK
INR, fingerstick: 2.8 — ABNORMAL HIGH (ref 0.80–1.20)
PT FINGERSTICK: 33.9 s — AB (ref 10.4–12.5)

## 2016-03-15 ENCOUNTER — Encounter (HOSPITAL_COMMUNITY): Payer: Self-pay

## 2016-03-15 ENCOUNTER — Ambulatory Visit (HOSPITAL_COMMUNITY)
Admission: RE | Admit: 2016-03-15 | Discharge: 2016-03-15 | Disposition: A | Discharge status: Home / Self Care | Payer: Medicare Other | Source: Ambulatory Visit | Attending: Cardiology | Admitting: Cardiology

## 2016-03-15 VITALS — BP 122/66 | HR 72 | Wt 220.0 lb

## 2016-03-15 DIAGNOSIS — Z853 Personal history of malignant neoplasm of breast: Secondary | ICD-10-CM | POA: Insufficient documentation

## 2016-03-15 DIAGNOSIS — Z7901 Long term (current) use of anticoagulants: Secondary | ICD-10-CM | POA: Diagnosis not present

## 2016-03-15 DIAGNOSIS — I2510 Atherosclerotic heart disease of native coronary artery without angina pectoris: Secondary | ICD-10-CM | POA: Insufficient documentation

## 2016-03-15 DIAGNOSIS — I712 Thoracic aortic aneurysm, without rupture: Secondary | ICD-10-CM | POA: Insufficient documentation

## 2016-03-15 DIAGNOSIS — I5181 Takotsubo syndrome: Secondary | ICD-10-CM | POA: Insufficient documentation

## 2016-03-15 DIAGNOSIS — I482 Chronic atrial fibrillation, unspecified: Secondary | ICD-10-CM

## 2016-03-15 DIAGNOSIS — R0602 Shortness of breath: Secondary | ICD-10-CM | POA: Diagnosis not present

## 2016-03-15 DIAGNOSIS — Z823 Family history of stroke: Secondary | ICD-10-CM | POA: Insufficient documentation

## 2016-03-15 DIAGNOSIS — Z800 Family history of malignant neoplasm of digestive organs: Secondary | ICD-10-CM | POA: Insufficient documentation

## 2016-03-15 DIAGNOSIS — R600 Localized edema: Secondary | ICD-10-CM | POA: Diagnosis not present

## 2016-03-15 DIAGNOSIS — M791 Myalgia: Secondary | ICD-10-CM | POA: Insufficient documentation

## 2016-03-15 DIAGNOSIS — I5032 Chronic diastolic (congestive) heart failure: Secondary | ICD-10-CM | POA: Insufficient documentation

## 2016-03-15 DIAGNOSIS — I110 Hypertensive heart disease with heart failure: Secondary | ICD-10-CM | POA: Diagnosis not present

## 2016-03-15 DIAGNOSIS — Z8249 Family history of ischemic heart disease and other diseases of the circulatory system: Secondary | ICD-10-CM | POA: Insufficient documentation

## 2016-03-15 DIAGNOSIS — I272 Other secondary pulmonary hypertension: Secondary | ICD-10-CM | POA: Diagnosis not present

## 2016-03-15 DIAGNOSIS — E785 Hyperlipidemia, unspecified: Secondary | ICD-10-CM | POA: Diagnosis not present

## 2016-03-15 DIAGNOSIS — K219 Gastro-esophageal reflux disease without esophagitis: Secondary | ICD-10-CM | POA: Insufficient documentation

## 2016-03-15 LAB — LIPID PANEL
CHOLESTEROL: 170 mg/dL (ref 0–200)
HDL: 48 mg/dL (ref >40)
LDL CALC: 99 mg/dL (ref 0–99)
TRIGLYCERIDES: 113 mg/dL (ref <150)
Total CHOL/HDL Ratio: 3.5 RATIO
VLDL: 23 mg/dL (ref 0–40)

## 2016-03-15 LAB — BASIC METABOLIC PANEL
Anion gap: 10 (ref 5–15)
BUN: 30 mg/dL — AB (ref 6–20)
CALCIUM: 9.7 mg/dL (ref 8.9–10.3)
CO2: 24 mmol/L (ref 22–32)
Chloride: 103 mmol/L (ref 101–111)
Creatinine, Ser: 1.2 mg/dL — ABNORMAL HIGH (ref 0.44–1.00)
GFR calc Af Amer: 48 mL/min — ABNORMAL LOW (ref >60)
GFR, EST NON AFRICAN AMERICAN: 42 mL/min — AB (ref >60)
GLUCOSE: 104 mg/dL — AB (ref 65–99)
Potassium: 4.5 mmol/L (ref 3.5–5.1)
Sodium: 137 mmol/L (ref 135–145)

## 2016-03-15 LAB — CBC
HEMATOCRIT: 35.6 % — AB (ref 36.0–46.0)
Hemoglobin: 11.4 g/dL — ABNORMAL LOW (ref 12.0–15.0)
MCH: 29.5 pg (ref 26.0–34.0)
MCHC: 32 g/dL (ref 30.0–36.0)
MCV: 92.2 fL (ref 78.0–100.0)
Platelets: 220 10*3/uL (ref 150–400)
RBC: 3.86 MIL/uL — ABNORMAL LOW (ref 3.87–5.11)
RDW: 15.2 % (ref 11.5–15.5)
WBC: 6.2 10*3/uL (ref 4.0–10.5)

## 2016-03-15 NOTE — Patient Instructions (Signed)
Labs today  We will contact you in 4 months to schedule your next appointment.  

## 2016-03-15 NOTE — Progress Notes (Signed)
Patient ID: [name], female   DOB: 05/27/1935, 81 y.o.   MRN: [id number]    Advanced Heart Failure Clinic Note   PCP: Karis Juba Cardiology: [name] HF Cardiology: Aundra Dubin  81 yo with history of poorly controlled HTN, chronic atrial fibrillation, chronic diastolic CHF, and prior episode of Takotsubo cardiomyopathy presents for CHF clinic appointment. Patient has had exertional dyspnea since 11/16.  At that time, she presented to the hospital with chest pain.  Coronary angiography showed no significant CAD.  EF was 35-40% by echo, suspected Takotsubo cardiomyopathy.  Her last echo in 3/17 showed EF improved to 123456 but PA systolic pressure elevated at 65 mmHg.  She has had ongoing problems with exertional dyspnea and lower extremity edema.  At a prior appointment, we adjusted her BP meds and increased Lasix to 80 qam/40 qpm for volume overload.   She presents today for HF follow up. At last visit hydralazine stopped and valsartan increased to decrease pill burden. Weight is up 3 lbs from previous visit. Weight stable at home 213 lbs. Breathing has been good. Hasn't had any further tightness under her breasts. Gets around the grocery fine without having to stop. Denies orthopnea/PND. No dizziness or lightheadedness. Peeing well on lasix as long as she drinks ok.   Labs (11/16): LDL 80 Labs (4/17): K 4.5, creatinine 1.1, BNP 132 Labs (5/17): LDL 92  PMH: 1. Hyperlipidemia: Myalgias with atorvastatin, Crestor, simvastatin.  2. Chronic atrial fibrillation 3. HTN: Poor control.  4. H/o breast cancer: 2013.  5. GERD 6. Takotsubo cardiomyopathy: Admitted in 11/16 with chest pain.  Coronary angiography showed nonobstructive CAD.  Echo (11/16) showed EF 35-40%.   - Echo (3/17) with EF 60-65%, mild aortic stenosis, PA systolic pressure 65 mmHg, RV mildly dilated with normal systolic function.  - Cardiolite (4/17) with EF 61%, small reversible mid anteroseptal/apical lateral defects thought to be  related to shifting breast artifact => low risk study.  7. Chronic diastolic CHF 8. Ascending aortic aneurysm: 4.3 cm by CTA in 11/16.  9. Pulmonary hypertension: PASP 65 mmHg by echo in 3/17.  CTA chest in 11/16 with no PE.  PFTs in 7/15 with mild obstructive lung disease.  10. Sleep study negative 2017.  Social History   Social History  . Marital Status: Married    Spouse Name: N/A  . Number of Children: N/A  . Years of Education: N/A   Occupational History  . Not on file.   Social History Main Topics  . Smoking status: Never Smoker   . Smokeless tobacco: Never Used  . Alcohol Use: No  . Drug Use: No  . Sexual Activity: Not Currently   Other Topics Concern  . Not on file   Social History Narrative   Family History  Problem Relation Age of Onset  . Heart disease Mother   . Cancer Sister     stomach and colon  . Heart disease Brother 36  . Cancer Sister   . Heart attack Mother   . Hypertension Father   . Stroke Neg Hx    ROS: All systems reviewed and negative except as per HPI.   Current Outpatient Prescriptions  Medication Sig Dispense Refill  . amLODipine (NORVASC) 10 MG tablet TAKE ONE TABLET BY MOUTH ONCE DAILY 90 tablet 1  . cloNIDine (CATAPRES) 0.1 MG tablet Take 1 tablet (0.1 mg total) by mouth 2 (two) times daily. 60 tablet 11  . ezetimibe (ZETIA) 10 MG tablet Take 1 tablet (10  mg total) by mouth daily. 30 tablet 3  . furosemide (LASIX) 40 MG tablet Take 2 tabs in AM and 1 tab in PM 90 tablet 3  . metoprolol succinate (TOPROL-XL) 50 MG 24 hr tablet Take 1 tablet (50 mg total) by mouth daily. Take with or immediately following a meal. 90 tablet 3  . omeprazole (PRILOSEC) 20 MG capsule Take 1 capsule (20 mg total) by mouth daily as needed (heartburn, acid reflux). 90 capsule 3  . OVER THE COUNTER MEDICATION Place 1 drop into both eyes daily as needed (dry eyes). Eye drops    . potassium chloride SA (K-DUR,KLOR-CON) 20 MEQ tablet Take 1 tablet (20 mEq total) by  mouth daily. 30 tablet 3  . spironolactone (ALDACTONE) 25 MG tablet Take 0.5 tablets (12.5 mg total) by mouth daily. 15 tablet 3  . valsartan (DIOVAN) 320 MG tablet Take 1 tablet (320 mg total) by mouth daily. 30 tablet 6  . warfarin (COUMADIN) 1 MG tablet Take 0.5 mg by mouth as directed. M-W-F-SA  0  . warfarin (COUMADIN) 5 MG tablet Take 0.5 tablets (2.5 mg total) by mouth See admin instructions. Take half a tablet (2.5 mg) on Tuesdays, Thursdays, and Sundays. Take half a tablet with half a tablet of 1 mg (total 3 mg) on all other days. 90 tablet 3  . acetaminophen (TYLENOL) 325 MG tablet Take 650 mg by mouth every 6 (six) hours as needed for mild pain. Reported on 03/15/2016    . albuterol (PROVENTIL HFA;VENTOLIN HFA) 108 (90 Base) MCG/ACT inhaler Inhale 2 puffs into the lungs every 6 (six) hours as needed for wheezing or shortness of breath. (Patient not taking: Reported on 03/15/2016) 1 Inhaler 0  . alum & mag hydroxide-simeth (MAALOX/MYLANTA) 200-200-20 MG/5ML suspension Take 30 mLs by mouth every 6 (six) hours as needed for indigestion or heartburn. Reported on 03/15/2016    . Cholecalciferol (VITAMIN D) 2000 UNITS CAPS Take 1 capsule by mouth daily as needed (for supplementation). Reported on 03/15/2016    . nitroGLYCERIN (NITROSTAT) 0.4 MG SL tablet Place 1 tablet (0.4 mg total) under the tongue every 5 (five) minutes as needed for chest pain (x 3 pills). (Patient not taking: Reported on 01/26/2016) 30 tablet 2   No current facility-administered medications for this encounter.   BP 122/66 mmHg  Pulse 72  Wt 220 lb (99.791 kg)  SpO2 99%   Wt Readings from Last 3 Encounters:  03/15/16 220 lb (99.791 kg)  01/29/16 217 lb (98.431 kg)  01/26/16 217 lb (98.431 kg)     General: NAD Neck: JVP 7-8 cm, no thyromegaly or thyroid nodule.  Lungs: Crackles at bases bilaterally. CV: Nondisplaced PMI.  Heart irregular S1/S2, no S3/S4, 2/6 early SEM RUSB.  Trace ankle edema.  No carotid bruit.   Normal pedal pulses.  Abdomen: Soft, NT, ND, no HSM. No bruits or masses. +BS  Skin: Intact without lesions or rashes.  Neurologic: Alert and oriented x 3.  Psych: Normal affect. Extremities: No clubbing or cyanosis.  HEENT: Normal.   Assessment/Plan: 1. Chronic atrial fibrillation: Rate-controlled on Toprol XL.   - She is on warfarin. Will check CBC today.  2. HTN: BP improved. - Continue valsartan to 360 mg daily. BMET today. - Continue other antihypertensives.  3. Chronic diastolic CHF: Volume status looks good and she is symptomatically improved (NYHA class II).  - Continue Lasix 80 qam/40 qpm and KCl 20 daily.  - Wear graded compression stockings during the day.  4.  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   - Given clinical improvement, think can hold off on RHC for now.  5. Suspected OSA: Sleep study 01/2016 without significant OSA.  6. Hyperlipidemia: She has been intolerant of statins.   - Continue Zetia 10 mg daily. Lipids today.   BMET, CBC, and Lipids today. Follow up in 4 months.   Shirley Friar, PA-C 03/15/2016   Patient seen with PA, agree with the above note.  She seems to be doing well.  Not volume overloaded and BP now controlled.  Will check BMET, CBC, and lipids today and continue current meds.  Followup in 4 months.   Loralie Champagne 03/15/2016

## 2016-03-17 ENCOUNTER — Ambulatory Visit: Payer: Medicare Other | Admitting: Cardiology

## 2016-04-28 ENCOUNTER — Ambulatory Visit (INDEPENDENT_AMBULATORY_CARE_PROVIDER_SITE_OTHER): Payer: Medicare Other | Admitting: Physician Assistant

## 2016-04-28 VITALS — BP 140/70 | HR 58 | Temp 98.1°F | Resp 16 | Wt 222.0 lb

## 2016-04-28 DIAGNOSIS — J309 Allergic rhinitis, unspecified: Secondary | ICD-10-CM

## 2016-04-28 DIAGNOSIS — Z7901 Long term (current) use of anticoagulants: Secondary | ICD-10-CM | POA: Diagnosis not present

## 2016-04-28 DIAGNOSIS — Z5181 Encounter for therapeutic drug level monitoring: Secondary | ICD-10-CM

## 2016-04-28 LAB — PT WITH INR/FINGERSTICK
INR FINGERSTICK: 3.2 — AB (ref 0.80–1.20)
PT, fingerstick: 38.3 seconds — ABNORMAL HIGH (ref 10.4–12.5)

## 2016-04-28 MED ORDER — CETIRIZINE HCL 10 MG PO TABS: 10.0000 mg | ORAL_TABLET | Freq: Every day | ORAL | 11 refills | Status: DC

## 2016-04-28 MED ORDER — MONTELUKAST SODIUM 10 MG PO TABS: 10.0000 mg | ORAL_TABLET | Freq: Every day | ORAL | 3 refills | Status: DC

## 2016-04-28 NOTE — Progress Notes (Signed)
Patient ID: [name] MRN: [id number], DOB: [birth year], 81 y.o. Date of Encounter: 04/28/2016, 11:52 AM    Chief Complaint:  Chief Complaint  Patient presents with  . Acute Visit    allergies   INR check  HPI: 81 y.o. year old white female presents with above.    Says that her eyes have been feeling itchy burning and watering. Also nose is running watery clear drainage and she is sneezing. Some cough secondary to postnasal drainage. Says that she has used several different eyedrops that her eye doctor has given her in the past. Initially said that she was using no other medicines but then when I mention Zyrttec she says that she has been taking that recently but still with these symptoms.  She is taking Coumadin as directed. No new medications. No skip doses of Coumadin. No significant change in vitamin K containing foods. No bleeding.  Home Meds:   Outpatient Medications Prior to Visit  Medication Sig Dispense Refill  . acetaminophen (TYLENOL) 325 MG tablet Take 650 mg by mouth every 6 (six) hours as needed for mild pain. Reported on 03/15/2016    . amLODipine (NORVASC) 10 MG tablet TAKE ONE TABLET BY MOUTH ONCE DAILY 90 tablet 1  . cloNIDine (CATAPRES) 0.1 MG tablet Take 1 tablet (0.1 mg total) by mouth 2 (two) times daily. 60 tablet 11  . ezetimibe (ZETIA) 10 MG tablet Take 1 tablet (10 mg total) by mouth daily. 30 tablet 3  . furosemide (LASIX) 40 MG tablet Take 2 tabs in AM and 1 tab in PM 90 tablet 3  . metoprolol succinate (TOPROL-XL) 50 MG 24 hr tablet Take 1 tablet (50 mg total) by mouth daily. Take with or immediately following a meal. 90 tablet 3  . nitroGLYCERIN (NITROSTAT) 0.4 MG SL tablet Place 1 tablet (0.4 mg total) under the tongue every 5 (five) minutes as needed for chest pain (x 3 pills). 30 tablet 2  . omeprazole (PRILOSEC) 20 MG capsule Take 1 capsule (20 mg total) by mouth daily as needed (heartburn, acid reflux). 90 capsule 3  . OVER THE COUNTER MEDICATION  Place 1 drop into both eyes daily as needed (dry eyes). Eye drops    . spironolactone (ALDACTONE) 25 MG tablet Take 0.5 tablets (12.5 mg total) by mouth daily. 15 tablet 3  . valsartan (DIOVAN) 320 MG tablet Take 1 tablet (320 mg total) by mouth daily. 30 tablet 6  . warfarin (COUMADIN) 1 MG tablet Take 0.5 mg by mouth as directed. M-W-F-SA  0  . warfarin (COUMADIN) 5 MG tablet Take 0.5 tablets (2.5 mg total) by mouth See admin instructions. Take half a tablet (2.5 mg) on Tuesdays, Thursdays, and Sundays. Take half a tablet with half a tablet of 1 mg (total 3 mg) on all other days. 90 tablet 3  . Cholecalciferol (VITAMIN D) 2000 UNITS CAPS Take 1 capsule by mouth daily as needed (for supplementation). Reported on 03/15/2016    . potassium chloride SA (K-DUR,KLOR-CON) 20 MEQ tablet Take 1 tablet (20 mEq total) by mouth daily. (Patient not taking: Reported on 04/28/2016) 30 tablet 3  . albuterol (PROVENTIL HFA;VENTOLIN HFA) 108 (90 Base) MCG/ACT inhaler Inhale 2 puffs into the lungs every 6 (six) hours as needed for wheezing or shortness of breath. (Patient not taking: Reported on 03/15/2016) 1 Inhaler 0  . alum & mag hydroxide-simeth (MAALOX/MYLANTA) 200-200-20 MG/5ML suspension Take 30 mLs by mouth every 6 (six) hours as needed for indigestion or  heartburn. Reported on 03/15/2016     No facility-administered medications prior to visit.     Allergies:  Allergies  Allergen Reactions  . Contrast Media [Iodinated Diagnostic Agents] Other (See Comments) and Hives    Per pt strong burning sensation starting in chest radiating outward Per pt strong burning sensation starting in chest radiating outward  . Clonidine Derivatives Other (See Comments)    Throat dry  . Statins Other (See Comments)    "bones hurt"  . Celebrex [Celecoxib] Rash  . Isosorbide Nitrate Itching and Rash  . Other Itching and Rash    Plastic and paper tape  . Tape Itching and Rash    Red Where applied and will spread       Review of Systems: See HPI for pertinent ROS. All other ROS negative.    Physical Exam: Blood pressure 140/70, pulse (!) 58, temperature 98.1 F (36.7 C), temperature source Oral, resp. rate 16, weight 222 lb (100.7 kg), SpO2 98 %., Body mass index is 34.77 kg/m. General:  Obese white female. Appears in no acute distress. HEENT: Normocephalic, atraumatic, eyes without discharge, sclera non-icteric, nares are without discharge. Bilateral auditory canals clear, TM's are without perforation, pearly grey and translucent with reflective cone of light bilaterally. Oral cavity moist, posterior pharynx without exudate, erythema, peritonsillar abscess. There is small amount of post nasal drip. No tenderness with percussion to frontal or maxillary sinuses bilaterally.  Neck: Supple. No thyromegaly. No lymphadenopathy. Lungs: Clear bilaterally to auscultation without wheezes, rales, or rhonchi. Breathing is unlabored. Heart: Irregular rhythym. Msk:  Strength and tone normal for age. Extremities/Skin: Warm and dry.  Neuro: Alert and oriented X 3. Moves all extremities spontaneously. Gait is normal. CNII-XII grossly in tact. Psych:  Responds to questions appropriately with a normal affect.     ASSESSMENT AND PLAN:  81 y.o. year old female with  1. Monitoring for long-term anticoagulant use - PT with INR/Fingerstick INR 3.2 ---- INR has been therapeutic at recent checks. Continue current Coumadin dosing. Can wait 4 weeks to recheck INR.  2. Allergic rhinitis, unspecified allergic rhinitis type - montelukast (SINGULAIR) 10 MG tablet; Take 1 tablet (10 mg total) by mouth at bedtime.  Dispense: 30 tablet; Refill: 3 - cetirizine (ZYRTEC) 10 MG tablet; Take 1 tablet (10 mg total) by mouth daily.  Dispense: 30 tablet; Refill: [address] [place], [place], [facility] 04/28/2016 11:52 AM

## 2016-05-03 ENCOUNTER — Other Ambulatory Visit (HOSPITAL_COMMUNITY): Payer: Self-pay | Admitting: Cardiology

## 2016-05-31 ENCOUNTER — Other Ambulatory Visit (HOSPITAL_COMMUNITY): Payer: Self-pay | Admitting: Cardiology

## 2016-05-31 DIAGNOSIS — Z1231 Encounter for screening mammogram for malignant neoplasm of breast: Secondary | ICD-10-CM | POA: Diagnosis not present

## 2016-05-31 DIAGNOSIS — Z853 Personal history of malignant neoplasm of breast: Secondary | ICD-10-CM | POA: Diagnosis not present

## 2016-05-31 LAB — HM MAMMOGRAPHY

## 2016-06-02 ENCOUNTER — Encounter: Payer: Self-pay | Admitting: Family Medicine

## 2016-06-02 ENCOUNTER — Encounter: Payer: Self-pay | Admitting: Cardiology

## 2016-06-02 ENCOUNTER — Ambulatory Visit (INDEPENDENT_AMBULATORY_CARE_PROVIDER_SITE_OTHER): Payer: Medicare Other | Admitting: Cardiology

## 2016-06-02 VITALS — BP 160/70 | HR 52 | Ht 67.0 in | Wt 220.8 lb

## 2016-06-02 DIAGNOSIS — I5181 Takotsubo syndrome: Secondary | ICD-10-CM

## 2016-06-02 DIAGNOSIS — I272 Other secondary pulmonary hypertension: Secondary | ICD-10-CM

## 2016-06-02 DIAGNOSIS — I77810 Thoracic aortic ectasia: Secondary | ICD-10-CM | POA: Diagnosis not present

## 2016-06-02 DIAGNOSIS — I482 Chronic atrial fibrillation, unspecified: Secondary | ICD-10-CM

## 2016-06-02 DIAGNOSIS — I10 Essential (primary) hypertension: Secondary | ICD-10-CM | POA: Diagnosis not present

## 2016-06-02 DIAGNOSIS — I2510 Atherosclerotic heart disease of native coronary artery without angina pectoris: Secondary | ICD-10-CM

## 2016-06-02 DIAGNOSIS — I2583 Coronary atherosclerosis due to lipid rich plaque: Secondary | ICD-10-CM

## 2016-06-02 DIAGNOSIS — I5032 Chronic diastolic (congestive) heart failure: Secondary | ICD-10-CM

## 2016-06-02 DIAGNOSIS — E785 Hyperlipidemia, unspecified: Secondary | ICD-10-CM

## 2016-06-02 NOTE — Progress Notes (Signed)
Cardiology Office Note    Date:  06/02/2016   ID:  [name], DOB [birth year], MRN [id number]  PCP:  Susan Juba, PA-C  Cardiologist:  Susan Him, MD   Chief Complaint  Patient presents with  . Coronary Artery Disease  . Atrial Fibrillation  . Hypertension  . Congestive Heart Failure    History of Present Illness:  [name] is a 81 y.o. female with a history of CAD, HTN, chronic diastolic CHF, dyslipidemia, mild pulmonary HTN, mild AS, mildly dilated aortic root and chronic AF.  She is doing well. She denies any chest pain or pressure, dizziness, palpitations or syncope. She has chronic SOB which is stable.  She has some LE edema intermittently which is stable.    Past Medical History:  Diagnosis Date  . Allergy    rhinitis  . Arthritis   . Atrial fibrillation    chronic atrial fibrillation  . Breast cancer ([place]) 01/06/2012  . Cancer [facility])    right colon and left breast  . Chronic diastolic CHF (congestive heart failure) ([place])   . Colon cancer ([place]) 01/06/2012  . COPD (chronic obstructive pulmonary disease) ([place])   . Coronary artery disease 2006   nonobstructive with 20-30% ostial LAD and LM  . Dilated aortic root ([place])   . Diverticulosis   . Edema extremities   . GERD (gastroesophageal reflux disease)   . Hernia   . Hiatal hernia    denies  . Hyperlipidemia   . Hypertension   . Mild aortic stenosis    echo 11/2015  . Osteopenia   . Pneumonia    hx child  . Pulmonary HTN ([place])    moderate to severe PASP 34mmHg echo 11/2015  . Takotsubo syndrome 07/29/2015   a. EF 35-40% by echo; akinesis of mid-apical anteroseptal and apical myocardium.  EF now normalized on echo 11/2015    Past Surgical History:  Procedure Laterality Date  . APPENDECTOMY    . BREAST SURGERY     lumpectomy left  . CARDIAC CATHETERIZATION    . CARDIAC CATHETERIZATION N/A 07/28/2015   Procedure: Left Heart Cath and Coronary Angiography;  Surgeon: Susan M Martinique, MD;  Location: [facility];  Service: Cardiovascular;  Laterality: N/A;  . CHOLECYSTECTOMY    . COLECTOMY     right side  . EXCISION OF ACCESSORY NIPPLE Bilateral 05/30/2013   Procedure: BILATERAL NIPPLE BIOPSY;  Surgeon: Susan Roof, MD;  Location: [facility];  Service: General;  Laterality: Bilateral;  . EYE SURGERY Bilateral 12   cataracts  . MASTECTOMY PARTIAL / LUMPECTOMY  2008   left breast  . SPLIT NIGHT STUDY  02/02/2016        Current Medications: Outpatient Medications Prior to Visit  Medication Sig Dispense Refill  . acetaminophen (TYLENOL) 325 MG tablet Take 650 mg by mouth every 6 (six) hours as needed for mild pain. Reported on 03/15/2016    . amLODipine (NORVASC) 10 MG tablet TAKE ONE TABLET BY MOUTH ONCE DAILY 90 tablet 1  . cetirizine (ZYRTEC) 10 MG tablet Take 1 tablet (10 mg total) by mouth daily. 30 tablet 11  . Cholecalciferol (VITAMIN D) 2000 UNITS CAPS Take 1 capsule by mouth daily as needed (for supplementation). Reported on 03/15/2016    . cloNIDine (CATAPRES) 0.1 MG tablet Take 1 tablet (0.1 mg total) by mouth 2 (two) times daily. 60 tablet 11  . ezetimibe (ZETIA) 10 MG tablet Take 1 tablet (10 mg total)  by mouth daily. 30 tablet 3  . furosemide (LASIX) 40 MG tablet TAKE TWO TABLETS BY MOUTH IN THE MORNING AND TAKE ONE TABLET BY MOUTH IN THE EVENING. 90 tablet 3  . metoprolol succinate (TOPROL-XL) 50 MG 24 hr tablet Take 1 tablet (50 mg total) by mouth daily. Take with or immediately following a meal. 90 tablet 3  . montelukast (SINGULAIR) 10 MG tablet Take 1 tablet (10 mg total) by mouth at bedtime. 30 tablet 3  . nitroGLYCERIN (NITROSTAT) 0.4 MG SL tablet Place 1 tablet (0.4 mg total) under the tongue every 5 (five) minutes as needed for chest pain (x 3 pills). 30 tablet 2  . omeprazole (PRILOSEC) 20 MG capsule Take 1 capsule (20 mg total) by mouth daily as needed (heartburn, acid reflux). 90 capsule 3  . OVER THE COUNTER MEDICATION Place 1 drop into both eyes daily as needed  (dry eyes). Eye drops    . potassium chloride SA (K-DUR,KLOR-CON) 20 MEQ tablet Take 1 tablet (20 mEq total) by mouth daily. 30 tablet 3  . spironolactone (ALDACTONE) 25 MG tablet TAKE ONE-HALF TABLET BY MOUTH DAILY. 15 tablet 3  . valsartan (DIOVAN) 320 MG tablet Take 1 tablet (320 mg total) by mouth daily. 30 tablet 6  . warfarin (COUMADIN) 1 MG tablet Take 0.5 mg by mouth as directed. M-W-F-SA  0  . warfarin (COUMADIN) 5 MG tablet Take 0.5 tablets (2.5 mg total) by mouth See admin instructions. Take half a tablet (2.5 mg) on Tuesdays, Thursdays, and Sundays. Take half a tablet with half a tablet of 1 mg (total 3 mg) on all other days. 90 tablet 3   No facility-administered medications prior to visit.      Allergies:   Contrast media [iodinated diagnostic agents]; Clonidine derivatives; Statins; Celebrex [celecoxib]; Isosorbide nitrate; Other; and Tape   Social History   Social History  . Marital status: Married    Spouse name: N/A  . Number of children: N/A  . Years of education: N/A   Social History Main Topics  . Smoking status: Never Smoker  . Smokeless tobacco: Never Used  . Alcohol use No  . Drug use: No  . Sexual activity: Not Currently   Other Topics Concern  . None   Social History Narrative  . None     Family History:  The patient's family history includes Cancer in her sister and sister; Heart attack in her mother; Heart disease in her mother; Heart disease (age of onset: 56) in her brother; Hypertension in her father.   ROS:   Please see the history of present illness.    ROS All other systems reviewed and are negative.  No flowsheet data found.     PHYSICAL EXAM:   VS:  BP (!) 160/70 (BP Location: Right Arm, Patient Position: Sitting, Cuff Size: Large)   Pulse (!) 52   Ht 5\' 7"  (1.702 m)   Wt 220 lb 12.8 oz (100.2 kg)   BMI 34.58 kg/m    GEN: Well nourished, well developed, in no acute distress  HEENT: normal  Neck: no JVD, carotid bruits, or  masses Cardiac: irregularly irregular; no murmurs, rubs, or gallops,no edema.  Intact distal pulses bilaterally.  Respiratory:  clear to auscultation bilaterally, normal work of breathing GI: soft, nontender, nondistended, + BS MS: no deformity or atrophy  Skin: warm and dry, no rash Neuro:  Alert and Oriented x 3, Strength and sensation are intact Psych: euthymic mood, full affect  Wt Readings  from Last 3 Encounters:  06/02/16 220 lb 12.8 oz (100.2 kg)  04/28/16 222 lb (100.7 kg)  03/15/16 220 lb (99.8 kg)      Studies/Labs Reviewed:   EKG:  EKG is not ordered today.    Recent Labs: 10/28/2015: Pro B Natriuretic peptide (BNP) 1,030.00 01/16/2016: ALT 19 01/26/2016: B Natriuretic Peptide 126.2 03/15/2016: BUN 30; Creatinine, Ser 1.20; Hemoglobin 11.4; Platelets 220; Potassium 4.5; Sodium 137   Lipid Panel    Component Value Date/Time   CHOL 170 03/15/2016 1057   TRIG 113 03/15/2016 1057   HDL 48 03/15/2016 1057   CHOLHDL 3.5 03/15/2016 1057   VLDL 23 03/15/2016 1057   LDLCALC 99 03/15/2016 1057    Additional studies/ records that were reviewed today include:  none    ASSESSMENT:    1. Takotsubo syndrome   2. Pulmonary HTN ([place])   3. Essential hypertension   4. Dilated aortic root ([place])   5. Coronary artery disease due to lipid rich plaque   6. Chronic diastolic CHF (congestive heart failure) ([place])   7. Chronic atrial fibrillation ([facility])   8. Hyperlipidemia      PLAN:  In order of problems listed above:  1. Takotsubo CM - LVF has normalized on last assessment.  EF 60-65%. 2. Pulmonary HTN - Moderate with PASP 8mmHg.  Most likely Group 2 secondary to pulmonary venous HTN from diastolic CHF.  I will repeat echo and if increased further will refer back to Dr. Aundra Dubin to consider Olivia Lopez de Gutierrez.  3. HTN - Bp borderline controlled.  I have asked her to check her BP daily for a week and call with results.  Continue amlodipine/Clonidine/BB/ARB and aldactone.  4. Dilated aortic  root at 5mm - followed with yearly echo.  Repeat 11/2016. 5. Minimal non obstructive ASCAD with no angina. No on ASA due to warfarin.  6. Chronic diastolic CHF - she appears euvolemic on exam today.  Weight is stable.  Continue Lasix. 7. Chronic atrial fibrillation rate controlled. Continue BB and warfarin.  8. Hyperlipidemia with LDL goal < 70.  Continue zetia.  LDL is not at goal.      Medication Adjustments/Labs and Tests Ordered: Current medicines are reviewed at length with the patient today.  Concerns regarding medicines are outlined above.  Medication changes, Labs and Tests ordered today are listed in the Patient Instructions below.  There are no Patient Instructions on file for this visit.   Signed, Susan Him, MD  06/02/2016 10:06 AM    [address], [place], [place]  [address] Phone: [phone]; Fax: [phone]

## 2016-06-02 NOTE — Patient Instructions (Signed)
Medication Instructions:  Your physician recommends that you continue on your current medications as directed. Please refer to the Current Medication list given to you today.   Labwork: None  Testing/Procedures: Your physician has requested that you have an echocardiogram. Echocardiography is a painless test that uses sound waves to create images of your heart. It provides your doctor with information about the size and shape of your heart and how well your heart's chambers and valves are working. This procedure takes approximately one hour. There are no restrictions for this procedure.  Follow-Up: Your physician wants you to follow-up in: 6 months with Dr. [name]. You will receive a reminder letter in the mail two months in advance. If you don't receive a letter, please call our office to schedule the follow-up appointment.   Any Other Special Instructions Will Be Listed Below (If Applicable). Please check your BLOOD PRESSURE daily for a week and call with results.    If you need a refill on your cardiac medications before your next appointment, please call your pharmacy.

## 2016-07-02 ENCOUNTER — Encounter: Payer: Self-pay | Admitting: Cardiology

## 2016-07-02 ENCOUNTER — Ambulatory Visit (HOSPITAL_COMMUNITY): Payer: Medicare Other | Attending: Cardiovascular Disease

## 2016-07-02 ENCOUNTER — Other Ambulatory Visit: Payer: Self-pay

## 2016-07-02 DIAGNOSIS — I272 Pulmonary hypertension, unspecified: Secondary | ICD-10-CM | POA: Insufficient documentation

## 2016-07-02 DIAGNOSIS — I071 Rheumatic tricuspid insufficiency: Secondary | ICD-10-CM | POA: Diagnosis not present

## 2016-07-02 DIAGNOSIS — E785 Hyperlipidemia, unspecified: Secondary | ICD-10-CM | POA: Diagnosis not present

## 2016-07-02 DIAGNOSIS — I110 Hypertensive heart disease with heart failure: Secondary | ICD-10-CM | POA: Insufficient documentation

## 2016-07-02 DIAGNOSIS — I509 Heart failure, unspecified: Secondary | ICD-10-CM | POA: Diagnosis not present

## 2016-07-02 DIAGNOSIS — I2510 Atherosclerotic heart disease of native coronary artery without angina pectoris: Secondary | ICD-10-CM | POA: Diagnosis not present

## 2016-07-02 DIAGNOSIS — C50919 Malignant neoplasm of unspecified site of unspecified female breast: Secondary | ICD-10-CM | POA: Insufficient documentation

## 2016-07-02 DIAGNOSIS — J449 Chronic obstructive pulmonary disease, unspecified: Secondary | ICD-10-CM | POA: Insufficient documentation

## 2016-07-02 DIAGNOSIS — I252 Old myocardial infarction: Secondary | ICD-10-CM | POA: Diagnosis not present

## 2016-07-02 DIAGNOSIS — I059 Rheumatic mitral valve disease, unspecified: Secondary | ICD-10-CM | POA: Diagnosis not present

## 2016-07-05 DIAGNOSIS — Z853 Personal history of malignant neoplasm of breast: Secondary | ICD-10-CM | POA: Diagnosis not present

## 2016-07-05 DIAGNOSIS — Z85038 Personal history of other malignant neoplasm of large intestine: Secondary | ICD-10-CM | POA: Diagnosis not present

## 2016-07-06 ENCOUNTER — Other Ambulatory Visit (HOSPITAL_COMMUNITY): Payer: Self-pay | Admitting: Cardiology

## 2016-07-06 ENCOUNTER — Other Ambulatory Visit: Payer: Self-pay | Admitting: Physician Assistant

## 2016-07-07 ENCOUNTER — Telehealth: Payer: Self-pay

## 2016-07-07 DIAGNOSIS — I5032 Chronic diastolic (congestive) heart failure: Secondary | ICD-10-CM

## 2016-07-07 DIAGNOSIS — I77810 Thoracic aortic ectasia: Secondary | ICD-10-CM

## 2016-07-07 DIAGNOSIS — I272 Pulmonary hypertension, unspecified: Secondary | ICD-10-CM

## 2016-07-07 NOTE — Telephone Encounter (Signed)
-----   Message from [name], MD sent at 07/02/2016  4:32 PM EDT ----- Echo showed normal LVF with mild to moderate LAE and mild RVE/RAE, mild to moderate TR and moderate pulmonary HTN - pulmonary HTN has improved since last echo.  The ascending aorta is mildly dilated.  Repeat echo in 1 year.

## 2016-07-07 NOTE — Telephone Encounter (Signed)
Repeat ECHO ordered to be scheduled for 1 year.

## 2016-07-08 ENCOUNTER — Ambulatory Visit (INDEPENDENT_AMBULATORY_CARE_PROVIDER_SITE_OTHER): Payer: Medicare Other | Admitting: Physician Assistant

## 2016-07-08 ENCOUNTER — Encounter: Payer: Self-pay | Admitting: Physician Assistant

## 2016-07-08 VITALS — BP 130/58 | HR 57 | Temp 97.6°F | Resp 16 | Wt 221.0 lb

## 2016-07-08 DIAGNOSIS — Z0000 Encounter for general adult medical examination without abnormal findings: Secondary | ICD-10-CM

## 2016-07-08 DIAGNOSIS — Z7901 Long term (current) use of anticoagulants: Secondary | ICD-10-CM

## 2016-07-08 DIAGNOSIS — Z5181 Encounter for therapeutic drug level monitoring: Secondary | ICD-10-CM

## 2016-07-08 DIAGNOSIS — Z23 Encounter for immunization: Secondary | ICD-10-CM | POA: Diagnosis not present

## 2016-07-08 DIAGNOSIS — Z79899 Other long term (current) drug therapy: Secondary | ICD-10-CM | POA: Diagnosis not present

## 2016-07-08 LAB — CBC WITH DIFFERENTIAL/PLATELET
BASOS PCT: 0 %
Basophils Absolute: 0 cells/uL (ref 0–200)
EOS ABS: 106 {cells}/uL (ref 15–500)
Eosinophils Relative: 2 %
HEMATOCRIT: 37.1 % (ref 35.0–45.0)
HEMOGLOBIN: 12 g/dL (ref 12.0–15.0)
LYMPHS ABS: 2014 {cells}/uL (ref 850–3900)
LYMPHS PCT: 38 %
MCH: 30.5 pg (ref 27.0–33.0)
MCHC: 32.3 g/dL (ref 32.0–36.0)
MCV: 94.2 fL (ref 80.0–100.0)
MONO ABS: 689 {cells}/uL (ref 200–950)
MPV: 9.7 fL (ref 7.5–12.5)
Monocytes Relative: 13 %
Neutro Abs: 2491 cells/uL (ref 1500–7800)
Neutrophils Relative %: 47 %
Platelets: 246 10*3/uL (ref 140–400)
RBC: 3.94 MIL/uL (ref 3.80–5.10)
RDW: 13.6 % (ref 11.0–15.0)
WBC: 5.3 10*3/uL (ref 3.8–10.8)

## 2016-07-08 LAB — PT WITH INR/FINGERSTICK
INR, fingerstick: 2.2 — ABNORMAL HIGH (ref 0.80–1.20)
PT FINGERSTICK: 26.4 s — AB (ref 10.4–12.5)

## 2016-07-08 LAB — TSH: TSH: 2.2 m[IU]/L

## 2016-07-08 MED ORDER — WARFARIN SODIUM 5 MG PO TABS: 2.5000 mg | ORAL_TABLET | ORAL | 2 refills | Status: DC

## 2016-07-08 MED ORDER — WARFARIN SODIUM 1 MG PO TABS: 0.5000 mg | ORAL_TABLET | ORAL | 2 refills | Status: DC

## 2016-07-08 NOTE — Addendum Note (Signed)
Addended by: Olena Mater on: 07/08/2016 11:40 AM   Modules accepted: Orders

## 2016-07-08 NOTE — Progress Notes (Signed)
Patient ID: [name] MRN: [id number], DOB: 11-08-1934, 81 y.o. Date of Encounter: 07/08/2016,   Chief Complaint: Physical (CPE)  HPI: 81 y.o. y/o female  here for CPE.   07/08/2016: She sees the following medical providers on a routine basis:  No longer having to have f/u at [facility]. They released her.  Just recently saw Dr. Addison Naegeli did breast exam. He did her breast surgery for her breast cancer. When he recently saw her--told her she did not continue f/u there.  Dr. Golden Hurter with [facility]--- she goes there every 6 months. She just had another f/u Echo, she reports.  Dr. Cristina Gong for GI--says she is to followup with him in 5 years  Saw Dermatologist at [facility]. Has another appt there in December.  She goes to [facility] doctor once a year---at [facility]--Dr. Jeanella Flattery (?sp)  She has no concerns today.   Review of Systems: Consitutional: No fever, chills, fatigue, night sweats, lymphadenopathy. No significant/unexplained weight changes. Eyes: No visual changes, eye redness, or discharge. ENT/Mouth: No ear pain, sore throat, nasal drainage, or sinus pain. Cardiovascular: No chest pressure,heaviness, tightness or squeezing, even with exertion. No increased shortness of breath or dyspnea on exertion.No palpitations, edema, orthopnea, PND. Respiratory: No cough, hemoptysis, SOB, or wheezing. Gastrointestinal: No anorexia, dysphagia, reflux, pain, nausea, vomiting, hematemesis, diarrhea, constipation, BRBPR, or melena. Breast: No mass, nodules, bulging, or retraction. No skin changes or inflammation. No nipple discharge. No lymphadenopathy. Genitourinary: No dysuria, hematuria, incontinence, vaginal discharge, pruritis, burning, abnormal bleeding, or pain. Musculoskeletal: No decreased ROM, No joint pain or swelling. No significant pain in neck, back, or extremities. Skin: No rash, pruritis, or concerning lesions. Neurological: No headache,  dizziness, syncope, seizures, tremors, memory loss, coordination problems, or paresthesias. Psychological: No anxiety, depression, hallucinations, SI/HI. Endocrine: No polydipsia, polyphagia, polyuria, or known diabetes.No increased fatigue. No palpitations/rapid heart rate. No significant/unexplained weight change. All other systems were reviewed and are otherwise negative.  Past Medical History:  Diagnosis Date  . Allergy    rhinitis  . Arthritis   . Breast cancer ([place]) 01/06/2012  . Cancer [facility])    right colon and left breast  . Chronic diastolic CHF (congestive heart failure) ([place])   . Colon cancer ([place]) 01/06/2012  . COPD (chronic obstructive pulmonary disease) ([place])   . Coronary artery disease 2006   nonobstructive with 20-30% ostial LAD and LM  . Dilated aortic root (HCC)    56mmHg  . Diverticulosis   . Edema extremities   . GERD (gastroesophageal reflux disease)   . Hernia   . Hiatal hernia    denies  . Hyperlipidemia   . Hypertension   . Mild aortic stenosis    echo 11/2015 but not noted on echo 06/2016  . Osteopenia   . Permanent atrial fibrillation (HCC)    chronic atrial fibrillation  . Pneumonia    hx child  . Pulmonary HTN    moderate to severe PASP 88mmHg echo 11/2015 - now 62mmHg by echo 06/2016  . Takotsubo syndrome 07/29/2015   a. EF 35-40% by echo; akinesis of mid-apical anteroseptal and apical myocardium.  EF now normalized on echo 11/2015     Past Surgical History:  Procedure Laterality Date  . APPENDECTOMY    . BREAST SURGERY     lumpectomy left  . CARDIAC CATHETERIZATION    . CARDIAC CATHETERIZATION N/A 07/28/2015   Procedure: Left Heart Cath and Coronary Angiography;  Surgeon: Peter M Martinique, MD;  Location:  [facility] INVASIVE CV LAB;  Service: Cardiovascular;  Laterality: N/A;  . CHOLECYSTECTOMY    . COLECTOMY     right side  . EXCISION OF ACCESSORY NIPPLE Bilateral 05/30/2013   Procedure: BILATERAL NIPPLE BIOPSY;  Surgeon: Merrie Roof, MD;  Location:  [facility];  Service: General;  Laterality: Bilateral;  . EYE SURGERY Bilateral 12   cataracts  . MASTECTOMY PARTIAL / LUMPECTOMY  2008   left breast  . SPLIT NIGHT STUDY  02/02/2016        Home Meds:  Outpatient Medications Prior to Visit  Medication Sig Dispense Refill  . acetaminophen (TYLENOL) 325 MG tablet Take 650 mg by mouth every 6 (six) hours as needed for mild pain. Reported on 03/15/2016    . amLODipine (NORVASC) 10 MG tablet TAKE ONE TABLET BY MOUTH ONCE DAILY 90 tablet 1  . cetirizine (ZYRTEC) 10 MG tablet Take 1 tablet (10 mg total) by mouth daily. 30 tablet 11  . Cholecalciferol (VITAMIN D) 2000 UNITS CAPS Take 1 capsule by mouth daily as needed (for supplementation). Reported on 03/15/2016    . cloNIDine (CATAPRES) 0.1 MG tablet Take 1 tablet (0.1 mg total) by mouth 2 (two) times daily. 60 tablet 11  . furosemide (LASIX) 40 MG tablet TAKE TWO TABLETS BY MOUTH IN THE MORNING AND TAKE ONE TABLET BY MOUTH IN THE EVENING. 90 tablet 3  . metoprolol succinate (TOPROL-XL) 50 MG 24 hr tablet Take 1 tablet (50 mg total) by mouth daily. Take with or immediately following a meal. 90 tablet 3  . montelukast (SINGULAIR) 10 MG tablet Take 1 tablet (10 mg total) by mouth at bedtime. 30 tablet 3  . nitroGLYCERIN (NITROSTAT) 0.4 MG SL tablet Place 1 tablet (0.4 mg total) under the tongue every 5 (five) minutes as needed for chest pain (x 3 pills). 30 tablet 2  . omeprazole (PRILOSEC) 20 MG capsule Take 1 capsule (20 mg total) by mouth daily as needed (heartburn, acid reflux). 90 capsule 3  . OVER THE COUNTER MEDICATION Place 1 drop into both eyes daily as needed (dry eyes). Eye drops    . potassium chloride SA (K-DUR,KLOR-CON) 20 MEQ tablet Take 1 tablet (20 mEq total) by mouth daily. 30 tablet 3  . spironolactone (ALDACTONE) 25 MG tablet TAKE ONE-HALF TABLET BY MOUTH DAILY. 15 tablet 3  . valsartan (DIOVAN) 320 MG tablet Take 1 tablet (320 mg total) by mouth daily. 30 tablet 6  . warfarin  (COUMADIN) 1 MG tablet Take 0.5 mg by mouth as directed. M-W-F-SA  0  . warfarin (COUMADIN) 5 MG tablet Take 0.5 tablets (2.5 mg total) by mouth See admin instructions. Take half a tablet (2.5 mg) on Tuesdays, Thursdays, and Sundays. Take half a tablet with half a tablet of 1 mg (total 3 mg) on all other days. 90 tablet 3  . ZETIA 10 MG tablet TAKE ONE TABLET BY MOUTH ONCE DAILY 30 tablet 3   No facility-administered medications prior to visit.     Allergies:  Allergies  Allergen Reactions  . Contrast Media [Iodinated Diagnostic Agents] Other (See Comments) and Hives    Per pt strong burning sensation starting in chest radiating outward Per pt strong burning sensation starting in chest radiating outward  . Clonidine Derivatives Other (See Comments)    Throat dry  . Statins Other (See Comments)    "bones hurt"  . Celebrex [Celecoxib] Rash  . Isosorbide Nitrate Itching and Rash  . Other Itching and Rash  Plastic and paper tape  . Tape Itching and Rash    Red Where applied and will spread    Social History   Social History  . Marital status: Married    Spouse name: N/A  . Number of children: N/A  . Years of education: N/A   Occupational History  . Not on file.   Social History Main Topics  . Smoking status: Never Smoker  . Smokeless tobacco: Never Used  . Alcohol use No  . Drug use: No  . Sexual activity: Not Currently   Other Topics Concern  . Not on file   Social History Narrative  . No narrative on file    Family History  Problem Relation Age of Onset  . Heart disease Mother   . Cancer Sister     stomach and colon  . Heart disease Brother 5  . Cancer Sister   . Heart attack Mother   . Hypertension Father   . Stroke Neg Hx     Physical Exam: Blood pressure (!) 130/58, pulse (!) 57, temperature 97.6 F (36.4 C), temperature source Oral, resp. rate 16, weight 221 lb (100.2 kg), SpO2 96 %., Body mass index is 34.61 kg/m. General: Obese WF. Appears in  no acute distress. HEENT: Normocephalic, atraumatic. Conjunctiva pink, sclera non-icteric. Pupils 2 mm constricting to 1 mm, round, regular, and equally reactive to light and accomodation. EOMI. Internal auditory canal clear. TMs with good cone of light and without pathology. Nasal mucosa pink. Nares are without discharge. No sinus tenderness. Oral mucosa pink.  Pharynx without exudate.   Neck: Supple. Trachea midline. No thyromegaly. Full ROM. No lymphadenopathy.No Carotid Bruits. Lungs: Clear to auscultation bilaterally without wheezes, rales, or rhonchi. Breathing is of normal effort and unlabored. Cardiovascular: Irregular rhythm. No murmur. . Distal pulses intact. No carotid or abdominal bruits. Breast: She defers breast exam. She just had breast exam with Dr. Marlou Starks. Abdomen: Soft, non-tender, non-distended with normoactive bowel sounds. No hepatosplenomegaly or masses. No rebound/guarding. No CVA tenderness. No hernias.  Genitourinary: Deferred. Age 23.  Musculoskeletal: Full range of motion and 5/5 strength throughout.  Skin: Warm and moist without erythema, ecchymosis, wounds, or rash. Neuro: A+Ox3. CN II-XII grossly intact. Moves all extremities spontaneously. Full sensation throughout. Normal gait.  Psych:  Responds to questions appropriately with a normal affect.   Assessment/Plan:  81 y.o. y/o female here for CPE  Medicare annual wellness visit, subsequent  Visit for preventive health examination  A. Screening Labs: 07/08/2016: He had lipid panel 03/15/16 so will not repeat this today. Check CBC, CMET, TSH, PT/INR  B. Pap: Not indicated as she is now age 40  C. Screening Mammogram:  07/08/2016: She had mammogram 05/31/16 negative D. DEXA/BMD: She had this done 01/09/2007--Showed Osteoporosis. It was ordered by Dr. Sheela Stack by them. Pt says she was on Fosamax--no longer on this now.     07/08/2016: I am assuming she has completed a five-year treatment of Fosamax and so no further  treatment warranted.  E. Colorectal Cancer Screening:   07/08/2016: She sees Dr. Cristina Gong at [place]. She states that her last colonoscopy was 2 or 3 years ago and he said to repeat 5 years but she says she doesn't want to have another one but we will  re-discussed this when this is due. F. Immunizations:  Influenza:  Is agreeable to get this here today. Given here 07/08/2016 Tetanus:  Received this--04/06/2010 Pneumococcal:  ---Pneumovax 23 was given 07/15/2005 ---Prevnar 13 was given 03/14/2014  Zostavax:--Patient reports that she did check with her insurance but it was going to cost her $80 so she did not get this vaccine and still does not want to get it.   Monitoring for long-term anticoagulant use - PT with INR/Fingerstick   Subjective:   Patient presents for Medicare Annual/Subsequent preventive examination.   Review Past Medical/Family/Social:This is all reviewed today.   Risk Factors  Current exercise habits: She does no exercise other than just walking around through the house and any errands. Dietary issues discussed: She has been educated regarding low cholesterol heart healthy diet  Cardiac risk factors: She sees cardiology routinely  Depression Screen  (Note: if answer to either of the following is "Yes", a more complete depression screening is indicated)  Over the past two weeks, have you felt down, depressed or hopeless? No Over the past two weeks, have you felt little interest or pleasure in doing things? No Have you lost interest or pleasure in daily life? No Do you often feel hopeless? No Do you cry easily over simple problems? No   Activities of Daily Living  In your present state of health, do you have any difficulty performing the following activities?:  Driving? No  Managing money? No  Feeding yourself? No  Getting from bed to chair? No  Climbing a flight of stairs? No  Preparing food and eating?: No  Bathing or showering? No  Getting dressed: No  Getting  to the toilet? No  Using the toilet:No  Moving around from place to place: No  In the past year have you fallen or had a near fall?:No  Are you sexually active? No  Do you have more than one partner? No   Hearing Difficulties: No  Do you often ask people to speak up or repeat themselves? No  Do you experience ringing or noises in your ears? No Do you have difficulty understanding soft or whispered voices? No  Do you feel that you have a problem with memory? No Do you often misplace items? No  Do you feel safe at home? Yes  Cognitive Testing  Alert? Yes Normal Appearance?Yes  Oriented to person? Yes Place? Yes  Time? Yes  Recall of three objects? Yes  Can perform simple calculations? Yes  Displays appropriate judgment?Yes  Can read the correct time from a watch face?Yes   List the Names of Other Physician/Practitioners you currently use:  This information is all documented above  Indicate any recent Medical Services you [place] have received from other than [facility] providers in the past year (date [place] be approximate).  This information is all documented above Screening Tests / Date--This information is all documented above Colonoscopy                     Zostavax  Mammogram  Influenza Vaccine  Tetanus/tdap    Assessment:    Annual wellness medicare exam   Plan:    During the course of the visit the patient was educated and counseled about appropriate screening and preventive services including:  Screening mammography  Colorectal cancer screening  Shingles vaccine. Prescription given to that she can get the vaccine at the pharmacy or Medicare part D.  Screen + for depression. PHQ- 9 score of 12 (moderate depression). We discussed the options of counseling versus possibly a medication. I encouraged her strongly think about the counseling. She is going through some medical problems currently and her husband is as well Mrs. been very stressful for her. She  says she will think  about it. She does have Xanax to use as needed. Though she Ledee benefit from an SSRI for her more depressive type symptoms but she wants to hold off at this time.  I aksed her to please have her cardioloist send records since we have none on file.  Diet review for nutrition referral? Yes ____ Not Indicated __x__  Patient Instructions (the written plan) was given to the patient.  Medicare Attestation  I have personally reviewed:  The patient's medical and social history  Their use of alcohol, tobacco or illicit drugs  Their current medications and supplements  The patient's functional ability including ADLs,fall risks, home safety risks, cognitive, and hearing and visual impairment  Diet and physical activities  Evidence for depression or mood disorders  The patient's weight, height, BMI, and visual acuity have been recorded in the chart. I have made referrals, counseling, and provided education to the patient based on review of the above and I have provided the patient with a written personalized care plan for preventive services.      [address] [place], [place], [facility] 07/08/2016 9:36 AM

## 2016-07-09 LAB — COMPLETE METABOLIC PANEL WITH GFR
ALBUMIN: 4.3 g/dL (ref 3.6–5.1)
ALK PHOS: 83 U/L (ref 33–130)
ALT: 14 U/L (ref 6–29)
AST: 21 U/L (ref 10–35)
BILIRUBIN TOTAL: 0.6 mg/dL (ref 0.2–1.2)
BUN: 23 mg/dL (ref 7–25)
CALCIUM: 9.7 mg/dL (ref 8.6–10.4)
CHLORIDE: 99 mmol/L (ref 98–110)
CO2: 28 mmol/L (ref 20–31)
CREATININE: 1.14 mg/dL — AB (ref 0.60–0.93)
GFR, EST AFRICAN AMERICAN: 53 mL/min — AB (ref >=60)
GFR, Est Non African American: 46 mL/min — ABNORMAL LOW (ref >=60)
Glucose, Bld: 86 mg/dL (ref 70–99)
Potassium: 4.7 mmol/L (ref 3.5–5.3)
Sodium: 139 mmol/L (ref 135–146)
TOTAL PROTEIN: 7.8 g/dL (ref 6.1–8.1)

## 2016-08-16 ENCOUNTER — Encounter: Payer: Self-pay | Admitting: Physician Assistant

## 2016-08-16 ENCOUNTER — Ambulatory Visit (INDEPENDENT_AMBULATORY_CARE_PROVIDER_SITE_OTHER): Payer: Medicare Other | Admitting: Physician Assistant

## 2016-08-16 ENCOUNTER — Telehealth: Payer: Self-pay

## 2016-08-16 VITALS — BP 148/62 | HR 59 | Temp 97.9°F | Resp 16 | Wt 217.0 lb

## 2016-08-16 DIAGNOSIS — G500 Trigeminal neuralgia: Secondary | ICD-10-CM

## 2016-08-16 MED ORDER — CARBAMAZEPINE ER 100 MG PO TB12: ORAL_TABLET | ORAL | 0 refills | Status: DC

## 2016-08-16 NOTE — Telephone Encounter (Signed)
I have clarified this

## 2016-08-16 NOTE — Progress Notes (Signed)
Patient ID: [name] MRN: [id number], DOB: June 03, 1935, 81 y.o. Date of Encounter: 08/16/2016, 12:09 PM    Chief Complaint:  Chief Complaint  Patient presents with  . Pain under right eye    x4days     HPI: 81 y.o. year old female presents with above.   Says the pain started out at level of  her right gum/mouth---"thought it was coming from her tooth"--but wears dentures---had no sore area around dentures---but it felt like "tooth ache" or something. Then started feeling the episodes of pain further up the right cheek.  Having episodes of shooting fleeting pain in the right cheek. States that she is not blowing any more from her nose than usual. No nasal congestion and no mucus from the nose. No fevers or chills. No sore throat or ear ache. Through the visit--she will say "there it goes" --- and grimace and says that feels shooting pain and points to right cheek.    Home Meds:   Outpatient Medications Prior to Visit  Medication Sig Dispense Refill  . acetaminophen (TYLENOL) 325 MG tablet Take 650 mg by mouth every 6 (six) hours as needed for mild pain. Reported on 03/15/2016    . amLODipine (NORVASC) 10 MG tablet TAKE ONE TABLET BY MOUTH ONCE DAILY 90 tablet 1  . cetirizine (ZYRTEC) 10 MG tablet Take 1 tablet (10 mg total) by mouth daily. 30 tablet 11  . Cholecalciferol (VITAMIN D) 2000 UNITS CAPS Take 1 capsule by mouth daily as needed (for supplementation). Reported on 03/15/2016    . cloNIDine (CATAPRES) 0.1 MG tablet Take 1 tablet (0.1 mg total) by mouth 2 (two) times daily. 60 tablet 11  . furosemide (LASIX) 40 MG tablet TAKE TWO TABLETS BY MOUTH IN THE MORNING AND TAKE ONE TABLET BY MOUTH IN THE EVENING. 90 tablet 3  . metoprolol succinate (TOPROL-XL) 50 MG 24 hr tablet Take 1 tablet (50 mg total) by mouth daily. Take with or immediately following a meal. 90 tablet 3  . montelukast (SINGULAIR) 10 MG tablet Take 1 tablet (10 mg total) by mouth at bedtime. 30 tablet 3  .  nitroGLYCERIN (NITROSTAT) 0.4 MG SL tablet Place 1 tablet (0.4 mg total) under the tongue every 5 (five) minutes as needed for chest pain (x 3 pills). 30 tablet 2  . omeprazole (PRILOSEC) 20 MG capsule Take 1 capsule (20 mg total) by mouth daily as needed (heartburn, acid reflux). 90 capsule 3  . OVER THE COUNTER MEDICATION Place 1 drop into both eyes daily as needed (dry eyes). Eye drops    . potassium chloride SA (K-DUR,KLOR-CON) 20 MEQ tablet Take 1 tablet (20 mEq total) by mouth daily. 30 tablet 3  . spironolactone (ALDACTONE) 25 MG tablet TAKE ONE-HALF TABLET BY MOUTH DAILY. 15 tablet 3  . valsartan (DIOVAN) 320 MG tablet Take 1 tablet (320 mg total) by mouth daily. 30 tablet 6  . warfarin (COUMADIN) 1 MG tablet Take 0.5 tablets (0.5 mg total) by mouth as directed. M-W-F-SA 90 tablet 2  . warfarin (COUMADIN) 5 MG tablet Take 0.5 tablets (2.5 mg total) by mouth See admin instructions. Take half a tablet (2.5 mg) on Tuesdays, Thursdays, and Sundays. Take half a tablet with half a tablet of 1 mg (total 3 mg) on all other days. 90 tablet 2  . ZETIA 10 MG tablet TAKE ONE TABLET BY MOUTH ONCE DAILY 30 tablet 3   No facility-administered medications prior to visit.     Allergies:  Allergies  Allergen Reactions  . Contrast Media [Iodinated Diagnostic Agents] Other (See Comments) and Hives    Per pt strong burning sensation starting in chest radiating outward Per pt strong burning sensation starting in chest radiating outward  . Clonidine Derivatives Other (See Comments)    Throat dry  . Statins Other (See Comments)    "bones hurt"  . Celebrex [Celecoxib] Rash  . Isosorbide Nitrate Itching and Rash  . Other Itching and Rash    Plastic and paper tape  . Tape Itching and Rash    Red Where applied and will spread      Review of Systems: See HPI for pertinent ROS. All other ROS negative.    Physical Exam: Blood pressure (!) 148/62, pulse (!) 59, temperature 97.9 F (36.6 C), temperature  source Oral, resp. rate 16, weight 217 lb (98.4 kg), SpO2 97 %., Body mass index is 33.99 kg/m. General:  WF. Appears in no acute distress. HEENT: Normocephalic, atraumatic, eyes without discharge, sclera non-icteric, nares are without discharge. Bilateral auditory canals clear, TM's are without perforation, pearly grey and translucent with reflective cone of light bilaterally. Oral cavity moist, posterior pharynx without exudate, erythema, peritonsillar abscess. Oral mucosa normal. No lesions or abnormalities. No rash on her face/cheek.  Neck: Supple. No thyromegaly. No lymphadenopathy. Lungs: Clear bilaterally to auscultation without wheezes, rales, or rhonchi. Breathing is unlabored. Heart: Regular rhythm. No murmurs, rubs, or gallops. Msk:  Strength and tone normal for age. Extremities/Skin: Warm and dry.  Neuro: Alert and oriented X 3. Moves all extremities spontaneously. Gait is normal. CNII-XII grossly in tact. Psych:  Responds to questions appropriately with a normal affect.     ASSESSMENT AND PLAN:  81 y.o. year old female with  1. Trigeminal neuralgia of right side of face Discussed with patient that this medicine can cause some people to feel drowsy, nauseous etc. Told her that we will start with a low dose and then titrate according to symptom relief/side effects. Told her to go home and take 1 tablet twice a day today. If this is causing no side effects, then tomorrow she will go on up to taking 2 tablets twice a day. Told her that if at any point she starts feeling nauseous etc. to just pause and stay at that dose rather than continuing to titrate up. As well as she gets to where her pain is controlled then will pause at that dose rather than continuing to titrate up. If she gets up to taking 3 tablets twice daily and her symptoms are not controlled, then she is to call me. If she has any questions or concerns she is to call us for further guidance. She voices understanding and  agrees. - carbamazepine (TEGRETOL-XR) 100 MG 12 hr tablet; Take 1 twice a day then increase to 2 tablets twice a day then increase to 3 tablets twice a day  Dispense: 60 tablet; Refill: 0   Signed, [address] [place], [place], [facility] 08/16/2016 12:09 PM

## 2016-08-16 NOTE — Telephone Encounter (Signed)
Pt states pharmacy was unable to fill rx b/c they stated the instructions were unclear. After day three do they go bck to instructions for day 1?

## 2016-08-17 NOTE — Telephone Encounter (Signed)
Info given to pharmacy.

## 2016-08-18 DIAGNOSIS — D485 Neoplasm of uncertain behavior of skin: Secondary | ICD-10-CM | POA: Diagnosis not present

## 2016-08-18 DIAGNOSIS — L309 Dermatitis, unspecified: Secondary | ICD-10-CM | POA: Diagnosis not present

## 2016-08-18 DIAGNOSIS — D225 Melanocytic nevi of trunk: Secondary | ICD-10-CM | POA: Diagnosis not present

## 2016-08-18 DIAGNOSIS — L821 Other seborrheic keratosis: Secondary | ICD-10-CM | POA: Diagnosis not present

## 2016-08-25 DIAGNOSIS — H26492 Other secondary cataract, left eye: Secondary | ICD-10-CM | POA: Diagnosis not present

## 2016-08-25 DIAGNOSIS — H02052 Trichiasis without entropian right lower eyelid: Secondary | ICD-10-CM | POA: Diagnosis not present

## 2016-08-31 ENCOUNTER — Other Ambulatory Visit (HOSPITAL_COMMUNITY): Payer: Self-pay | Admitting: Cardiology

## 2016-09-01 ENCOUNTER — Encounter: Payer: Self-pay | Admitting: Physician Assistant

## 2016-09-01 ENCOUNTER — Ambulatory Visit (INDEPENDENT_AMBULATORY_CARE_PROVIDER_SITE_OTHER): Payer: Medicare Other | Admitting: Physician Assistant

## 2016-09-01 VITALS — BP 142/70 | HR 64 | Temp 97.7°F | Resp 16 | Wt 217.0 lb

## 2016-09-01 DIAGNOSIS — B9789 Other viral agents as the cause of diseases classified elsewhere: Secondary | ICD-10-CM | POA: Diagnosis not present

## 2016-09-01 DIAGNOSIS — J988 Other specified respiratory disorders: Secondary | ICD-10-CM

## 2016-09-01 NOTE — Progress Notes (Signed)
Patient ID: [name] MRN: [id number], DOB: 1934/12/09, 81 y.o. Date of Encounter: 09/01/2016, 2:41 PM    Chief Complaint:  Chief Complaint  Patient presents with  . URI     HPI: 81 y.o. year old female presents with above.   Her husband is being seen for visit today as well. Patient states that she had to take him to an urgent care yesterday and he actually went to more than 1 urgent care because the first one they went to was so busy that the wait was going to be multiple hours. Says that "both of those places were just full of sick people and she just knew she was going to come down with the flu." Says this morning she woke up with some hoarseness and has had a little congestion. "Wanted to see if there was anything" I "could give her to keep her from getting the flu."  No other complaints or concerns. No sore throat. No fevers or chills.       Home Meds:   Outpatient Medications Prior to Visit  Medication Sig Dispense Refill  . acetaminophen (TYLENOL) 325 MG tablet Take 650 mg by mouth every 6 (six) hours as needed for mild pain. Reported on 03/15/2016    . amLODipine (NORVASC) 10 MG tablet TAKE ONE TABLET BY MOUTH ONCE DAILY 90 tablet 1  . carbamazepine (TEGRETOL-XR) 100 MG 12 hr tablet Take 1 twice a day then increase to 2 tablets twice a day then increase to 3 tablets twice a day 60 tablet 0  . cetirizine (ZYRTEC) 10 MG tablet Take 1 tablet (10 mg total) by mouth daily. 30 tablet 11  . Cholecalciferol (VITAMIN D) 2000 UNITS CAPS Take 1 capsule by mouth daily as needed (for supplementation). Reported on 03/15/2016    . cloNIDine (CATAPRES) 0.1 MG tablet Take 1 tablet (0.1 mg total) by mouth 2 (two) times daily. 60 tablet 11  . furosemide (LASIX) 40 MG tablet TAKE TWO TABLETS BY MOUTH IN THE MORNING AND TAKE ONE TABLET BY MOUTH IN THE EVENING. 90 tablet 3  . metoprolol succinate (TOPROL-XL) 50 MG 24 hr tablet Take 1 tablet (50 mg total) by mouth daily. Take with or immediately  following a meal. 90 tablet 3  . montelukast (SINGULAIR) 10 MG tablet Take 1 tablet (10 mg total) by mouth at bedtime. 30 tablet 3  . nitroGLYCERIN (NITROSTAT) 0.4 MG SL tablet Place 1 tablet (0.4 mg total) under the tongue every 5 (five) minutes as needed for chest pain (x 3 pills). 30 tablet 2  . omeprazole (PRILOSEC) 20 MG capsule Take 1 capsule (20 mg total) by mouth daily as needed (heartburn, acid reflux). 90 capsule 3  . OVER THE COUNTER MEDICATION Place 1 drop into both eyes daily as needed (dry eyes). Eye drops    . potassium chloride SA (K-DUR,KLOR-CON) 20 MEQ tablet Take 1 tablet (20 mEq total) by mouth daily. 30 tablet 3  . spironolactone (ALDACTONE) 25 MG tablet TAKE ONE-HALF TABLET BY MOUTH ONCE DAILY 15 tablet 3  . valsartan (DIOVAN) 320 MG tablet TAKE ONE TABLET BY MOUTH ONCE DAILY 30 tablet 3  . warfarin (COUMADIN) 1 MG tablet Take 0.5 tablets (0.5 mg total) by mouth as directed. M-W-F-SA 90 tablet 2  . warfarin (COUMADIN) 5 MG tablet Take 0.5 tablets (2.5 mg total) by mouth See admin instructions. Take half a tablet (2.5 mg) on Tuesdays, Thursdays, and Sundays. Take half a tablet with half a tablet of  1 mg (total 3 mg) on all other days. 90 tablet 2  . ZETIA 10 MG tablet TAKE ONE TABLET BY MOUTH ONCE DAILY 30 tablet 3   No facility-administered medications prior to visit.     Allergies:  Allergies  Allergen Reactions  . Contrast Media [Iodinated Diagnostic Agents] Other (See Comments) and Hives    Per pt strong burning sensation starting in chest radiating outward Per pt strong burning sensation starting in chest radiating outward  . Clonidine Derivatives Other (See Comments)    Throat dry  . Statins Other (See Comments)    "bones hurt"  . Celebrex [Celecoxib] Rash  . Isosorbide Nitrate Itching and Rash  . Other Itching and Rash    Plastic and paper tape  . Tape Itching and Rash    Red Where applied and will spread      Review of Systems: See HPI for pertinent  ROS. All other ROS negative.    Physical Exam: Blood pressure (!) 142/70, pulse 64, temperature 97.7 F (36.5 C), temperature source Oral, resp. rate 16, weight 217 lb (98.4 kg), SpO2 97 %., Body mass index is 33.99 kg/m. General:  WNWD WF. Appears in no acute distress. HEENT: Normocephalic, atraumatic, eyes without discharge, sclera non-icteric, nares are without discharge. Bilateral auditory canals clear, TM's are without perforation, pearly grey and translucent with reflective cone of light bilaterally. Oral cavity moist, posterior pharynx without exudate, erythema, peritonsillar abscess.  Neck: Supple. No thyromegaly. No lymphadenopathy. Lungs: Clear bilaterally to auscultation without wheezes, rales, or rhonchi. Breathing is unlabored. Heart: Irregular rhythm.  Msk:  Strength and tone normal for age. Extremities/Skin: Warm and dry.  Neuro: Alert and oriented X 3. Moves all extremities spontaneously. Gait is normal. CNII-XII grossly in tact. Psych:  Responds to questions appropriately with a normal affect.     ASSESSMENT AND PLAN:  81 y.o. year old female with   1. Viral respiratory infection Told her that there is no indication for antibiotics at this point or Tamiflu. She is to use over-the-counter medications for symptom relief. If symptoms worsen significantly or persist greater than 7-10 days then follow-up.   [address] [place], [place], [facility] 09/01/2016 2:41 PM

## 2016-10-25 ENCOUNTER — Other Ambulatory Visit: Payer: Self-pay | Admitting: Physician Assistant

## 2016-10-25 ENCOUNTER — Other Ambulatory Visit (HOSPITAL_COMMUNITY): Payer: Self-pay | Admitting: Cardiology

## 2016-10-25 ENCOUNTER — Other Ambulatory Visit: Payer: Self-pay | Admitting: *Deleted

## 2016-10-25 MED ORDER — FUROSEMIDE 40 MG PO TABS: ORAL_TABLET | ORAL | 2 refills | Status: DC

## 2016-10-26 NOTE — Telephone Encounter (Signed)
Refill appropriate 

## 2016-11-15 ENCOUNTER — Ambulatory Visit (INDEPENDENT_AMBULATORY_CARE_PROVIDER_SITE_OTHER): Payer: Medicare Other | Admitting: Physician Assistant

## 2016-11-15 VITALS — BP 180/70 | HR 68 | Temp 97.8°F | Resp 16 | Wt 222.8 lb

## 2016-11-15 DIAGNOSIS — Z7901 Long term (current) use of anticoagulants: Secondary | ICD-10-CM | POA: Diagnosis not present

## 2016-11-15 LAB — PT WITH INR/FINGERSTICK
INR FINGERSTICK: 2 — AB (ref 0.80–1.20)
PT, fingerstick: 24.5 seconds — ABNORMAL HIGH (ref 10.4–12.5)

## 2016-11-15 NOTE — Progress Notes (Signed)
Patient ID: [name] MRN: [id number], DOB: Jun 22, 1935, 82 y.o. Date of Encounter: 11/15/2016, 9:11 AM    Chief Complaint:  Chief Complaint  Patient presents with  . protime check   INR check  HPI: 82 y.o. year old white female presents with above.     She is taking Coumadin as directed. No new medications. No skip doses of Coumadin. No significant change in vitamin K containing foods. No bleeding.  Home Meds:   Outpatient Medications Prior to Visit  Medication Sig Dispense Refill  . acetaminophen (TYLENOL) 325 MG tablet Take 650 mg by mouth every 6 (six) hours as needed for mild pain. Reported on 03/15/2016    . amLODipine (NORVASC) 10 MG tablet TAKE ONE TABLET BY MOUTH ONCE DAILY 90 tablet 1  . carbamazepine (TEGRETOL-XR) 100 MG 12 hr tablet Take 1 twice a day then increase to 2 tablets twice a day then increase to 3 tablets twice a day 60 tablet 0  . cetirizine (ZYRTEC) 10 MG tablet Take 1 tablet (10 mg total) by mouth daily. 30 tablet 11  . Cholecalciferol (VITAMIN D) 2000 UNITS CAPS Take 1 capsule by mouth daily as needed (for supplementation). Reported on 03/15/2016    . cloNIDine (CATAPRES) 0.1 MG tablet Take 1 tablet (0.1 mg total) by mouth 2 (two) times daily. 60 tablet 11  . furosemide (LASIX) 40 MG tablet TAKE TWO TABLETS BY MOUTH IN THE MORNING AND TAKE ONE TABLET BY MOUTH IN THE EVENING. 270 tablet 2  . metoprolol succinate (TOPROL-XL) 50 MG 24 hr tablet TAKE ONE TABLET BY MOUTH ONCE DAILY TAKE  WITH  OR  FOLLOWING  A  MEAL 90 tablet 3  . montelukast (SINGULAIR) 10 MG tablet Take 1 tablet (10 mg total) by mouth at bedtime. 30 tablet 3  . nitroGLYCERIN (NITROSTAT) 0.4 MG SL tablet Place 1 tablet (0.4 mg total) under the tongue every 5 (five) minutes as needed for chest pain (x 3 pills). 30 tablet 2  . omeprazole (PRILOSEC) 20 MG capsule Take 1 capsule (20 mg total) by mouth daily as needed (heartburn, acid reflux). 90 capsule 3  . OVER THE COUNTER MEDICATION Place 1 drop  into both eyes daily as needed (dry eyes). Eye drops    . potassium chloride SA (K-DUR,KLOR-CON) 20 MEQ tablet Take 1 tablet (20 mEq total) by mouth daily. 30 tablet 3  . spironolactone (ALDACTONE) 25 MG tablet TAKE ONE-HALF TABLET BY MOUTH ONCE DAILY 15 tablet 3  . valsartan (DIOVAN) 320 MG tablet TAKE ONE TABLET BY MOUTH ONCE DAILY 30 tablet 3  . warfarin (COUMADIN) 1 MG tablet Take 0.5 tablets (0.5 mg total) by mouth as directed. M-W-F-SA 90 tablet 2  . warfarin (COUMADIN) 5 MG tablet Take 0.5 tablets (2.5 mg total) by mouth See admin instructions. Take half a tablet (2.5 mg) on Tuesdays, Thursdays, and Sundays. Take half a tablet with half a tablet of 1 mg (total 3 mg) on all other days. 90 tablet 2  . ZETIA 10 MG tablet TAKE ONE TABLET BY MOUTH ONCE DAILY 30 tablet 3   No facility-administered medications prior to visit.     Allergies:  Allergies  Allergen Reactions  . Contrast Media [Iodinated Diagnostic Agents] Other (See Comments) and Hives    Per pt strong burning sensation starting in chest radiating outward Per pt strong burning sensation starting in chest radiating outward  . Clonidine Derivatives Other (See Comments)    Throat dry  . Statins Other (  See Comments)    "bones hurt"  . Celebrex [Celecoxib] Rash  . Isosorbide Nitrate Itching and Rash  . Other Itching and Rash    Plastic and paper tape  . Tape Itching and Rash    Red Where applied and will spread      Review of Systems: See HPI for pertinent ROS. All other ROS negative.    Physical Exam: Blood pressure (!) 180/70, pulse 68, temperature 97.8 F (36.6 C), temperature source Oral, resp. rate 16, weight 222 lb 12.8 oz (101.1 kg), SpO2 98 %., Body mass index is 34.9 kg/m. General:  Obese white female. Appears in no acute distress. Neck: Supple. No thyromegaly. No lymphadenopathy. Lungs: Clear bilaterally to auscultation without wheezes, rales, or rhonchi. Breathing is unlabored. Heart: Irregular  rhythym. Msk:  Strength and tone normal for age. Extremities/Skin: Warm and dry.  Neuro: Alert and oriented X 3. Moves all extremities spontaneously. Gait is normal. CNII-XII grossly in tact. Psych:  Responds to questions appropriately with a normal affect.     ASSESSMENT AND PLAN:  82 y.o. year old female with  1. Monitoring for long-term anticoagulant use - PT with INR/Fingerstick INR 2.0 Continue current dose of Coumadin. Recheck INR 4 weeks. F/U sooner if needed.  Signed, [address] [place], [place], [facility] 11/15/2016 9:11 AM

## 2016-11-18 ENCOUNTER — Other Ambulatory Visit (HOSPITAL_COMMUNITY): Payer: Self-pay | Admitting: Cardiology

## 2016-11-18 MED ORDER — VALSARTAN 320 MG PO TABS: 320.0000 mg | ORAL_TABLET | Freq: Every day | ORAL | 3 refills | Status: DC

## 2016-11-28 DIAGNOSIS — E785 Hyperlipidemia, unspecified: Secondary | ICD-10-CM | POA: Insufficient documentation

## 2016-11-28 NOTE — Progress Notes (Addendum)
Cardiology Office Note    Date:  11/29/2016   ID:  [name], DOB 10-Oct-1934, MRN [id number]  PCP:  Karis Juba, PA-C  Cardiologist:  Fransico Him, MD   Chief Complaint  Patient presents with  . Atrial Fibrillation  . Hypertension  . Congestive Heart Failure    History of Present Illness:  [name] is a 82 y.o. female with a history of CAD, HTN, chronic diastolic CHF, prior history of Takotsubo DCM (EF 35-40% 07/2015 and repeat echo 3/17 with normalization of LVF), dyslipidemia, moderate pulmonary HTN with chronic DOE followed in AHF clinic, mild AS, mildly dilated aortic root and permanent AF.   She presents today for routine cardiac followup.  She is doing well today.  She denies any anginal chest pain or pressure.  She has a chronic tightness in her chest that was present before her cath and has never gone away since then.  It is under her left breast and she says it is worse when she walks.  She says that it is similar to when she had the Takotsubo DCM.  She denies any dizziness, palpitations or syncope. She has chronic SOB which is stable.  She has some LE edema intermittently which is stable on the diuretics.  She denies any orthopnea, PND  Past Medical History:  Diagnosis Date  . Allergy    rhinitis  . Arthritis   . Breast cancer ([place]) 01/06/2012  . Cancer [facility])    right colon and left breast  . Chronic diastolic CHF (congestive heart failure) ([place])   . Colon cancer ([place]) 01/06/2012  . COPD (chronic obstructive pulmonary disease) ([place])   . Coronary artery disease 2006   nonobstructive with 20-30% ostial LAD and LM  . Dilated aortic root (H[facility])    86mmHg  . Diverticulosis   . Edema extremities   . GERD (gastroesophageal reflux disease)   . Hernia   . Hiatal hernia    denies  . Hyperlipidemia   . Hypertension   . Mild aortic stenosis    echo 11/2015 but not noted on echo 06/2016  . Osteopenia   . Permanent atrial fibrillation (HCC)    chronic atrial  fibrillation  . Pneumonia    hx child  . Pulmonary HTN    moderate to severe PASP 80mmHg echo 11/2015 - now 84mmHg by echo 06/2016  . Takotsubo syndrome 07/29/2015   a. EF 35-40% by echo; akinesis of mid-apical anteroseptal and apical myocardium.  EF now normalized on echo 11/2015    Past Surgical History:  Procedure Laterality Date  . APPENDECTOMY    . BREAST SURGERY     lumpectomy left  . CARDIAC CATHETERIZATION    . CARDIAC CATHETERIZATION N/A 07/28/2015   Procedure: Left Heart Cath and Coronary Angiography;  Surgeon: Peter M Martinique, MD;  Location: [facility];  Service: Cardiovascular;  Laterality: N/A;  . CHOLECYSTECTOMY    . COLECTOMY     right side  . EXCISION OF ACCESSORY NIPPLE Bilateral 05/30/2013   Procedure: BILATERAL NIPPLE BIOPSY;  Surgeon: Merrie Roof, MD;  Location: [facility];  Service: General;  Laterality: Bilateral;  . EYE SURGERY Bilateral 12   cataracts  . MASTECTOMY PARTIAL / LUMPECTOMY  2008   left breast  . SPLIT NIGHT STUDY  02/02/2016        Current Medications: Current Meds  Medication Sig  . acetaminophen (TYLENOL) 325 MG tablet Take 650 mg by mouth every 6 (six) hours  as needed for mild pain. Reported on 03/15/2016  . amLODipine (NORVASC) 10 MG tablet TAKE ONE TABLET BY MOUTH ONCE DAILY  . cetirizine (ZYRTEC) 10 MG tablet Take 1 tablet (10 mg total) by mouth daily.  . Cholecalciferol (VITAMIN D) 2000 UNITS CAPS Take 1 capsule by mouth daily as needed (for supplementation). Reported on 03/15/2016  . clobetasol cream (TEMOVATE) 0.05 % Apply 1.54 application topically as needed.  . cloNIDine (CATAPRES) 0.1 MG tablet Take 1 tablet (0.1 mg total) by mouth 2 (two) times daily.  . furosemide (LASIX) 40 MG tablet TAKE TWO TABLETS BY MOUTH IN THE MORNING AND TAKE ONE TABLET BY MOUTH IN THE EVENING.  . metoprolol succinate (TOPROL-XL) 50 MG 24 hr tablet TAKE ONE TABLET BY MOUTH ONCE DAILY TAKE  WITH  OR  FOLLOWING  A  MEAL  . montelukast (SINGULAIR) 10 MG  tablet Take 1 tablet (10 mg total) by mouth at bedtime.  . nitroGLYCERIN (NITROSTAT) 0.4 MG SL tablet Place 1 tablet (0.4 mg total) under the tongue every 5 (five) minutes as needed for chest pain (x 3 pills).  Marland Kitchen omeprazole (PRILOSEC) 20 MG capsule Take 1 capsule (20 mg total) by mouth daily as needed (heartburn, acid reflux).  Marland Kitchen OVER THE COUNTER MEDICATION Place 1 drop into both eyes daily as needed (dry eyes). Eye drops  . potassium chloride SA (K-DUR,KLOR-CON) 20 MEQ tablet Take 1 tablet (20 mEq total) by mouth daily.  Marland Kitchen spironolactone (ALDACTONE) 25 MG tablet TAKE ONE-HALF TABLET BY MOUTH ONCE DAILY  . valsartan (DIOVAN) 320 MG tablet Take 1 tablet (320 mg total) by mouth daily.  Marland Kitchen warfarin (COUMADIN) 1 MG tablet Take 0.5 tablets (0.5 mg total) by mouth as directed. M-W-F-SA  . warfarin (COUMADIN) 5 MG tablet Take 0.5 tablets (2.5 mg total) by mouth See admin instructions. Take half a tablet (2.5 mg) on Tuesdays, Thursdays, and Sundays. Take half a tablet with half a tablet of 1 mg (total 3 mg) on all other days.  Marland Kitchen ZETIA 10 MG tablet TAKE ONE TABLET BY MOUTH ONCE DAILY    Allergies:   Contrast media [iodinated diagnostic agents]; Clonidine derivatives; Statins; Celebrex [celecoxib]; Isosorbide nitrate; Other; and Tape   Social History   Social History  . Marital status: Married    Spouse name: N/A  . Number of children: N/A  . Years of education: N/A   Social History Main Topics  . Smoking status: Never Smoker  . Smokeless tobacco: Never Used  . Alcohol use No  . Drug use: No  . Sexual activity: Not Currently   Other Topics Concern  . None   Social History Narrative  . None     Family History:  The patient's family history includes Cancer in her sister and sister; Heart attack in her mother; Heart disease in her mother; Heart disease (age of onset: 74) in her brother; Hypertension in her father.   ROS:   Please see the history of present illness.    ROS All other systems  reviewed and are negative.  No flowsheet data found.     PHYSICAL EXAM:   VS:  BP (!) 162/74   Pulse (!) 52   Ht 5' 6.5" (1.689 m)   Wt 221 lb 6.4 oz (100.4 kg)   BMI 35.20 kg/m    GEN: Well nourished, well developed, in no acute distress  HEENT: normal  Neck: no JVD, carotid bruits, or masses Cardiac: irregularly irregular; no murmurs, rubs, or gallops,no edema.  Intact distal pulses bilaterally.  Respiratory:  clear to auscultation bilaterally, normal work of breathing GI: soft, nontender, nondistended, + BS MS: no deformity or atrophy  Skin: warm and dry, no rash Neuro:  Alert and Oriented x 3, Strength and sensation are intact Psych: euthymic mood, full affect  Wt Readings from Last 3 Encounters:  11/29/16 221 lb 6.4 oz (100.4 kg)  11/15/16 222 lb 12.8 oz (101.1 kg)  09/01/16 217 lb (98.4 kg)      Studies/Labs Reviewed:   EKG:  EKG is ordered today and shows atrial fibrillation with slow VR  Recent Labs: 01/26/2016: B Natriuretic Peptide 126.2 07/08/2016: ALT 14; BUN 23; Creat 1.14; Hemoglobin 12.0; Platelets 246; Potassium 4.7; Sodium 139; TSH 2.20   Lipid Panel    Component Value Date/Time   CHOL 170 03/15/2016 1057   TRIG 113 03/15/2016 1057   HDL 48 03/15/2016 1057   CHOLHDL 3.5 03/15/2016 1057   VLDL 23 03/15/2016 1057   LDLCALC 99 03/15/2016 1057    Additional studies/ records that were reviewed today include:  none    ASSESSMENT:    1. Coronary artery disease involving native coronary artery of native heart without angina pectoris   2. Permanent atrial fibrillation ([place])   3. Dilated aortic root (HCC)   4. Pulmonary HTN   5. Chronic diastolic CHF (congestive heart failure) ([facility])   6. Leg swelling   7. Dyslipidemia, goal LDL below 70   8. Essential hypertension      PLAN:  In order of problems listed above:  1. ASCAD with history of nonobstructive with 20-30% ostial LAD and LM - she has no anginal symptoms although she says that she has  a chronic tightness under her breasts that was there even before her cath and has never gone away. She says it is reminiscent of her prior HF with her Takotsubo. A nuclear stress test a year ago for the same pain was low risk.  I will check a BNP.  No ASA due to warfarin.  She is statin intolerant.  2. Permanent atrial fibrillation - rate controlled.  She will continue on BB and warfarin.  Her INR is followed in coumadin clinic.  3. Dilated ascending aorta at 69mmHg.  Repeat echo 06/2017. 4. Moderate pulmonary HTN (PASP 33mmHg by echo 06/2016) likely secondary to Group 2 pulmonary venous hypertension for chronic diastolic CHF.  I will Repeat echo 06/2017.  This is followed by Dr. Aundra Dubin in AHF clinic. She will continue on diuretic therapy   5. Chronic diastolic CHF - she appears euvolemic and weight is stable. She is NYHA Class II.   She will continue on current dose of Lasix. Check BMET. 6. LE edema - this is controlled on diuretic therapy,  7. Dyslipidemia with LDL goal < 70.   She is statin intolerant.  She had cramps in her legs and stopped the zetia and the pain stopped and now is back on it.  I told her to go ahead and stay off of Zetia.  Her last LDL was 99.  I will check another FLP and ALT and refer to lipid clinic. 8. HTN - BP borderline controlled today but at home she says that it runs consistently 140/45mmHg.  She will continue on current meds.  She will continue on ARB/BB/aldactone/amlodipine and clonidine.    Medication Adjustments/Labs and Tests Ordered: Current medicines are reviewed at length with the patient today.  Concerns regarding medicines are outlined above.  Medication changes, Labs and  Tests ordered today are listed in the Patient Instructions below.  There are no Patient Instructions on file for this visit.   Signed, Fransico Him, MD  11/29/2016 10:40 AM    [facility] [address], [place], [place]  [address] Phone: [phone]; Fax: [phone]

## 2016-11-29 ENCOUNTER — Encounter (INDEPENDENT_AMBULATORY_CARE_PROVIDER_SITE_OTHER): Payer: Self-pay

## 2016-11-29 ENCOUNTER — Ambulatory Visit (INDEPENDENT_AMBULATORY_CARE_PROVIDER_SITE_OTHER): Payer: Medicare Other | Admitting: Cardiology

## 2016-11-29 ENCOUNTER — Encounter: Payer: Self-pay | Admitting: Cardiology

## 2016-11-29 VITALS — BP 162/74 | HR 52 | Ht 66.5 in | Wt 221.4 lb

## 2016-11-29 DIAGNOSIS — I77810 Thoracic aortic ectasia: Secondary | ICD-10-CM | POA: Diagnosis not present

## 2016-11-29 DIAGNOSIS — I10 Essential (primary) hypertension: Secondary | ICD-10-CM | POA: Diagnosis not present

## 2016-11-29 DIAGNOSIS — I272 Pulmonary hypertension, unspecified: Secondary | ICD-10-CM

## 2016-11-29 DIAGNOSIS — I482 Chronic atrial fibrillation: Secondary | ICD-10-CM

## 2016-11-29 DIAGNOSIS — M7989 Other specified soft tissue disorders: Secondary | ICD-10-CM | POA: Diagnosis not present

## 2016-11-29 DIAGNOSIS — I4821 Permanent atrial fibrillation: Secondary | ICD-10-CM

## 2016-11-29 DIAGNOSIS — I5032 Chronic diastolic (congestive) heart failure: Secondary | ICD-10-CM | POA: Diagnosis not present

## 2016-11-29 DIAGNOSIS — I2510 Atherosclerotic heart disease of native coronary artery without angina pectoris: Secondary | ICD-10-CM | POA: Diagnosis not present

## 2016-11-29 DIAGNOSIS — E785 Hyperlipidemia, unspecified: Secondary | ICD-10-CM | POA: Diagnosis not present

## 2016-11-29 NOTE — Patient Instructions (Signed)
Medication Instructions:  1) STOP ZETIA  Labwork: TODAY: BMET, CBC, LFTs, BNP, Lipids  Testing/Procedures: Your physician has requested that you have an echocardiogram in October 2018. Echocardiography is a painless test that uses sound waves to create images of your heart. It provides your doctor with information about the size and shape of your heart and how well your heart's chambers and valves are working. This procedure takes approximately one hour. There are no restrictions for this procedure.   Follow-Up: Your physician recommends that you schedule a follow-up appointment in the [facility].  Your physician wants you to follow-up in: 6 months with Dr. [name]. You will receive a reminder letter in the mail two months in advance. If you don't receive a letter, please call our office to schedule the follow-up appointment.   Any Other Special Instructions Will Be Listed Below (If Applicable).     If you need a refill on your cardiac medications before your next appointment, please call your pharmacy.

## 2016-11-30 LAB — LIPID PANEL
CHOLESTEROL TOTAL: 157 mg/dL (ref 100–199)
Chol/HDL Ratio: 2.7 ratio units (ref 0.0–4.4)
HDL: 59 mg/dL (ref >39)
LDL Calculated: 81 mg/dL (ref 0–99)
TRIGLYCERIDES: 84 mg/dL (ref 0–149)
VLDL CHOLESTEROL CAL: 17 mg/dL (ref 5–40)

## 2016-11-30 LAB — CBC WITH DIFFERENTIAL/PLATELET
BASOS ABS: 0 10*3/uL (ref 0.0–0.2)
Basos: 0 %
EOS (ABSOLUTE): 0.1 10*3/uL (ref 0.0–0.4)
Eos: 2 %
HEMOGLOBIN: 12.8 g/dL (ref 11.1–15.9)
Hematocrit: 39.1 % (ref 34.0–46.6)
IMMATURE GRANS (ABS): 0 10*3/uL (ref 0.0–0.1)
IMMATURE GRANULOCYTES: 0 %
LYMPHS: 41 %
Lymphocytes Absolute: 3 10*3/uL (ref 0.7–3.1)
MCH: 29.9 pg (ref 26.6–33.0)
MCHC: 32.7 g/dL (ref 31.5–35.7)
MCV: 91 fL (ref 79–97)
MONOCYTES: 11 %
Monocytes Absolute: 0.8 10*3/uL (ref 0.1–0.9)
Neutrophils Absolute: 3.4 10*3/uL (ref 1.4–7.0)
Neutrophils: 46 %
Platelets: 248 10*3/uL (ref 150–379)
RBC: 4.28 x10E6/uL (ref 3.77–5.28)
RDW: 14.1 % (ref 12.3–15.4)
WBC: 7.3 10*3/uL (ref 3.4–10.8)

## 2016-11-30 LAB — BASIC METABOLIC PANEL
BUN / CREAT RATIO: 27 (ref 12–28)
BUN: 26 mg/dL (ref 8–27)
CHLORIDE: 99 mmol/L (ref 96–106)
CO2: 26 mmol/L (ref 18–29)
Calcium: 9.7 mg/dL (ref 8.7–10.3)
Creatinine, Ser: 0.98 mg/dL (ref 0.57–1.00)
GFR calc Af Amer: 63 mL/min/{1.73_m2} (ref >59)
GFR calc non Af Amer: 55 mL/min/{1.73_m2} — ABNORMAL LOW (ref >59)
GLUCOSE: 99 mg/dL (ref 65–99)
Potassium: 4 mmol/L (ref 3.5–5.2)
Sodium: 141 mmol/L (ref 134–144)

## 2016-11-30 LAB — HEPATIC FUNCTION PANEL
ALT: 16 IU/L (ref 0–32)
AST: 21 IU/L (ref 0–40)
Albumin: 4.5 g/dL (ref 3.5–4.8)
Alkaline Phosphatase: 95 IU/L (ref 39–117)
Bilirubin Total: 0.4 mg/dL (ref 0.0–1.2)
Bilirubin, Direct: 0.11 mg/dL (ref 0.00–0.40)
Total Protein: 7.7 g/dL (ref 6.0–8.5)

## 2016-11-30 LAB — PRO B NATRIURETIC PEPTIDE: NT-PRO BNP: 834 pg/mL — AB (ref 0–738)

## 2016-12-08 ENCOUNTER — Encounter (INDEPENDENT_AMBULATORY_CARE_PROVIDER_SITE_OTHER): Payer: Self-pay

## 2016-12-08 ENCOUNTER — Ambulatory Visit (INDEPENDENT_AMBULATORY_CARE_PROVIDER_SITE_OTHER): Payer: Medicare Other | Admitting: Pharmacist

## 2016-12-08 ENCOUNTER — Encounter: Payer: Self-pay | Admitting: Pharmacist

## 2016-12-08 DIAGNOSIS — E785 Hyperlipidemia, unspecified: Secondary | ICD-10-CM | POA: Diagnosis not present

## 2016-12-08 MED ORDER — ROSUVASTATIN CALCIUM 5 MG PO TABS: ORAL_TABLET | ORAL | 3 refills | Status: DC

## 2016-12-08 NOTE — Patient Instructions (Signed)
Start rosuvastatin 5mg  once weekly    Cholesterol Cholesterol is a fat. Your body needs a small amount of cholesterol. Cholesterol (plaque) Roz[place]ski build up in your blood vessels (arteries). That makes you more likely to have a heart attack or stroke. You cannot feel your cholesterol level. Having a blood test is the only way to find out if your level is high. Keep your test results. Work with your doctor to keep your cholesterol at a good level. What do the results mean?  Total cholesterol is how much cholesterol is in your blood.  LDL is bad cholesterol. This is the type that can build up. Try to have low LDL.  HDL is good cholesterol. It cleans your blood vessels and carries LDL away. Try to have high HDL.  Triglycerides are fat that the body can store or burn for energy. What are good levels of cholesterol?  Total cholesterol below 200.  LDL below 100 is good for people who have health risks. LDL below 70 is good for people who have very high risks.  HDL above 40 is good. It is best to have HDL of 60 or higher.  Triglycerides below 150. How can I lower my cholesterol? Diet  Follow your diet program as told by your doctor.  Choose fish, white meat chicken, or [place] that is roasted or baked. Try not to eat red meat, fried foods, sausage, or lunch meats.  Eat lots of fresh fruits and vegetables.  Choose whole grains, beans, pasta, potatoes, and cereals.  Choose olive oil, corn oil, or canola oil. Only use small amounts.  Try not to eat butter, mayonnaise, shortening, or palm kernel oils.  Try not to eat foods with trans fats.  Choose low-fat or nonfat dairy foods.  Drink skim or nonfat milk.  Eat low-fat or nonfat yogurt and cheeses.  Try not to drink whole milk or cream.  Try not to eat ice cream, egg yolks, or full-fat cheeses.  Healthy desserts include angel food cake, ginger snaps, animal crackers, hard candy, popsicles, and low-fat or nonfat frozen yogurt. Try not  to eat pastries, cakes, pies, and cookies. Exercise  Follow your exercise program as told by your doctor.  Be more active. Try gardening, walking, and taking the stairs.  Ask your doctor about ways that you can be more active. Medicine  Take over-the-counter and prescription medicines only as told by your doctor. This information is not intended to replace advice given to you by your health care provider. Make sure you discuss any questions you have with your health care provider. Document Released: 11/19/2008 Document Revised: 03/24/2016 Document Reviewed: 03/04/2016 Elsevier Interactive Patient Education  2017 [employer].

## 2016-12-08 NOTE — Progress Notes (Signed)
Patient ID: [name]                 DOB: 1935-04-09                    MRN: [id number]     HPI: [name] is a 82 y.o. female patient of Dr. [name] that presents today for lipid evaluation.  PMH includes CAD, HTN, chronic diastolic CHF, takotsubo syndrome, dyslipidemia, mild pulmonary HTN, mild AS, mildly dilated aortic root and chronic AF.    She presents today and states that she has had problems with cholesterol medications as they cause her to ache.   LDL Goal: <70  Current Medications: none  Intolerances: Zetia - cramps in legs that resolved after discontinuation Simvastatin, Crestor daily dosing, Pravastin - her bones hurt so bad she could barely walk; this resolved upon discontinuation of therapy   Family History:  family history includes Cancer in her sister and sister; Heart attack in her mother; Heart disease in her mother; Heart disease (age of onset: 42) in her brother; Hypertension in her father. There is no history of Stroke.  Social History:   reports that she has never smoked. She has never used smokeless tobacco. She reports that she does not drink alcohol or use illicit drugs.  Diet:  Breakfast- bacon and toast, egg. No coffee Doesn't add salt to food.  Reports eating fast foods 2-3x/week.  Drinks diet cokes daily.   Exercise: Isnt able to do much walking at the present time.   Labs: 11/29/2016: TC 157, TG 84, HDL 59, LDL 81 - had been off Zetia for about 1 month  Past Medical History:  Diagnosis Date  . Allergy    rhinitis  . Arthritis   . Breast cancer ([place]) 01/06/2012  . Cancer [facility])    right colon and left breast  . Chronic diastolic CHF (congestive heart failure) ([place])   . Colon cancer ([place]) 01/06/2012  . COPD (chronic obstructive pulmonary disease) ([place])   . Coronary artery disease 2006   nonobstructive with 20-30% ostial LAD and LM  . Dilated aortic root ([facility])    14mmHg  . Diverticulosis   . Edema extremities   . GERD (gastroesophageal  reflux disease)   . Hernia   . Hiatal hernia    denies  . Hyperlipidemia   . Hypertension   . Mild aortic stenosis    echo 11/2015 but not noted on echo 06/2016  . Osteopenia   . Permanent atrial fibrillation ([facility])    chronic atrial fibrillation  . Pneumonia    hx child  . Pulmonary HTN    moderate to severe PASP 80mmHg echo 11/2015 - now 51mmHg by echo 06/2016  . Takotsubo syndrome 07/29/2015   a. EF 35-40% by echo; akinesis of mid-apical anteroseptal and apical myocardium.  EF now normalized on echo 11/2015    Current Outpatient Prescriptions on File Prior to Visit  Medication Sig Dispense Refill  . acetaminophen (TYLENOL) 325 MG tablet Take 650 mg by mouth every 6 (six) hours as needed for mild pain. Reported on 03/15/2016    . amLODipine (NORVASC) 10 MG tablet TAKE ONE TABLET BY MOUTH ONCE DAILY 90 tablet 1  . cetirizine (ZYRTEC) 10 MG tablet Take 1 tablet (10 mg total) by mouth daily. 30 tablet 11  . Cholecalciferol (VITAMIN D) 2000 UNITS CAPS Take 1 capsule by mouth daily as needed (for supplementation). Reported on 03/15/2016    . clobetasol cream (TEMOVATE)  0.05 % Apply 9.02 application topically as needed.  0  . cloNIDine (CATAPRES) 0.1 MG tablet Take 1 tablet (0.1 mg total) by mouth 2 (two) times daily. 60 tablet 11  . furosemide (LASIX) 40 MG tablet TAKE TWO TABLETS BY MOUTH IN THE MORNING AND TAKE ONE TABLET BY MOUTH IN THE EVENING. 270 tablet 2  . metoprolol succinate (TOPROL-XL) 50 MG 24 hr tablet TAKE ONE TABLET BY MOUTH ONCE DAILY TAKE  WITH  OR  FOLLOWING  A  MEAL 90 tablet 3  . montelukast (SINGULAIR) 10 MG tablet Take 1 tablet (10 mg total) by mouth at bedtime. 30 tablet 3  . nitroGLYCERIN (NITROSTAT) 0.4 MG SL tablet Place 1 tablet (0.4 mg total) under the tongue every 5 (five) minutes as needed for chest pain (x 3 pills). 30 tablet 2  . omeprazole (PRILOSEC) 20 MG capsule Take 1 capsule (20 mg total) by mouth daily as needed (heartburn, acid reflux). 90 capsule 3  .  OVER THE COUNTER MEDICATION Place 1 drop into both eyes daily as needed (dry eyes). Eye drops    . potassium chloride SA (K-DUR,KLOR-CON) 20 MEQ tablet Take 1 tablet (20 mEq total) by mouth daily. 30 tablet 3  . spironolactone (ALDACTONE) 25 MG tablet TAKE ONE-HALF TABLET BY MOUTH ONCE DAILY 15 tablet 3  . valsartan (DIOVAN) 320 MG tablet Take 1 tablet (320 mg total) by mouth daily. 90 tablet 3  . warfarin (COUMADIN) 1 MG tablet Take 0.5 tablets (0.5 mg total) by mouth as directed. M-W-F-SA 90 tablet 2  . warfarin (COUMADIN) 5 MG tablet Take 0.5 tablets (2.5 mg total) by mouth See admin instructions. Take half a tablet (2.5 mg) on Tuesdays, Thursdays, and Sundays. Take half a tablet with half a tablet of 1 mg (total 3 mg) on all other days. 90 tablet 2   No current facility-administered medications on file prior to visit.     Allergies  Allergen Reactions  . Contrast Media [Iodinated Diagnostic Agents] Other (See Comments) and Hives    Per pt strong burning sensation starting in chest radiating outward Per pt strong burning sensation starting in chest radiating outward  . Clonidine Derivatives Other (See Comments)    Throat dry  . Statins Other (See Comments)    "bones hurt"  . Celebrex [Celecoxib] Rash  . Isosorbide Nitrate Itching and Rash  . Other Itching and Rash    Plastic and paper tape  . Tape Itching and Rash    Red Where applied and will spread    Assessment/Plan: Hyperlipidemia: LDL not at goal <70 now off Zetia due to intolerable muscle aches. Discussed treatment options, such as research trial and PCSK9i therapy. She does not wish to participate in a research trial. She is willing to pursue injectable therapy, but concerned with cost and is not interested in applying for patient assistance. After lengthy discussion she is willing to rechallenge with rosuvastatin 5mg  once weekly and increase as tolerated. Will recheck lipid/hepatic panel in 3 months with follow up in lipid  clinic.   Thank you,  Lelan Pons. [place], [facility]  12/08/2016 7:33 AM

## 2016-12-13 ENCOUNTER — Other Ambulatory Visit: Payer: Self-pay | Admitting: Physician Assistant

## 2016-12-13 ENCOUNTER — Other Ambulatory Visit: Payer: Self-pay | Admitting: Cardiology

## 2016-12-13 NOTE — Telephone Encounter (Signed)
Medication refilled per protocol. 

## 2016-12-22 ENCOUNTER — Telehealth: Payer: Self-pay | Admitting: Cardiology

## 2016-12-22 NOTE — Telephone Encounter (Signed)
LDL not at goal - refer to lipid clinic

## 2016-12-22 NOTE — Telephone Encounter (Signed)
Lab results were already discussed with pt at office visit on 4/4. Plan was for pt to challenge with Crestor 5mg  once weekly since her LDL increased to 81 after discontinuing Zetia due to myalgias. F/u labs scheduled in 3 months.

## 2016-12-22 NOTE — Telephone Encounter (Addendum)
Patient called receive lab results from 3/26 but they have not been reviewed by MD. Reviewed results with patient.  She understands she will be called only if Dr. [name] has further recommendations.   She was seen in the Lipid Clinic 4/4 and has plans for future fasting labs.  To Dr. [name].

## 2016-12-22 NOTE — Telephone Encounter (Signed)
New Message     Pt calling for her lab results from march the 12th, please call

## 2016-12-22 NOTE — Telephone Encounter (Signed)
To Lipid Clinic for review.  The patient has fasting labs and follow-up Lipid Clinic appointment scheduled 7/11. She understands she would be called only if there are further recommendations.

## 2016-12-27 ENCOUNTER — Other Ambulatory Visit (HOSPITAL_COMMUNITY): Payer: Self-pay | Admitting: Cardiology

## 2016-12-30 DIAGNOSIS — H02052 Trichiasis without entropian right lower eyelid: Secondary | ICD-10-CM | POA: Diagnosis not present

## 2016-12-30 DIAGNOSIS — H43813 Vitreous degeneration, bilateral: Secondary | ICD-10-CM | POA: Diagnosis not present

## 2016-12-31 ENCOUNTER — Encounter: Payer: Self-pay | Admitting: Family Medicine

## 2016-12-31 ENCOUNTER — Ambulatory Visit (INDEPENDENT_AMBULATORY_CARE_PROVIDER_SITE_OTHER): Payer: Medicare Other | Admitting: Family Medicine

## 2016-12-31 VITALS — BP 142/74 | HR 60 | Temp 98.2°F | Resp 16 | Ht 66.5 in | Wt 223.0 lb

## 2016-12-31 DIAGNOSIS — B029 Zoster without complications: Secondary | ICD-10-CM

## 2016-12-31 DIAGNOSIS — R21 Rash and other nonspecific skin eruption: Secondary | ICD-10-CM | POA: Diagnosis not present

## 2016-12-31 MED ORDER — TRIAMCINOLONE ACETONIDE 0.1 % EX CREA: 1.0000 "application " | TOPICAL_CREAM | Freq: Two times a day (BID) | CUTANEOUS | 0 refills | Status: DC

## 2016-12-31 MED ORDER — VALACYCLOVIR HCL 1 G PO TABS: 1000.0000 mg | ORAL_TABLET | Freq: Three times a day (TID) | ORAL | 0 refills | Status: DC

## 2016-12-31 NOTE — Progress Notes (Signed)
   Subjective:    Patient ID: [name], female    DOB: 26-Sep-1934, 82 y.o.   MRN: [id number]  Patient presents for Rash (x1 day- red burning irritation to L side abd and L knee)  Pt here with Rash to her left lower abdomen as well as her left medial knee. States it started yesterday. Is a burning sensation with some itching she denies any contact with anything new to the skin. Her newest medication is Crestor but has been over a month ago and she only takes it once a week. She denies any poison ivy poison oak. She used some nystatin powder to help with the burning. She has never had shingles. No fever has felt well otherwise     Review Of Systems:  GEN- denies fatigue, fever, weight loss,weakness, recent illness HEENT- denies eye drainage, change in vision, nasal discharge, CVS- denies chest pain, palpitations RESP- denies SOB, cough, wheeze ABD- denies N/V, change in stools, abd pain Neuro- denies headache, dizziness, syncope, seizure activity       Objective:    BP (!) 142/74   Pulse 60   Temp 98.2 F (36.8 C) (Oral)   Resp 16   Ht 5' 6.5" (1.689 m)   Wt 223 lb (101.2 kg)   SpO2 98%   BMI 35.45 kg/m  GEN- NAD, alert and oriented x3 CVS- irregular rhythem, normal rate  no murmur RESP-CTAB ABD-NABS,soft,NT,ND Skin- left lower abdomen- erythematous patch, +excoriations, no blisters, left knee- medial aspect 3x2 area of erythema, no raised lesions no blisters, no papules, few scabs on hands, eczema knuckles  EXT- No edema,varicose veins  Pulses- Radial  2+        Assessment & Plan:      Problem List Items Addressed This Visit    None    Visit Diagnoses    Rash and nonspecific skin eruption    -  Primary   Concern for possible shingles very early onset, with erythema burning, no blisters seen- start valtred, also given Triamcinolone for burn itch, other DD contact dermatitis since she has 2 spots    Herpes zoster without complication       Relevant Medications   valACYclovir (VALTREX) 1000 MG tablet      Note: This dictation was prepared with Dragon dictation along with smaller phrase technology. Any transcriptional errors that result from this process are unintentional.

## 2017-01-13 ENCOUNTER — Encounter: Payer: Self-pay | Admitting: Physician Assistant

## 2017-01-13 ENCOUNTER — Ambulatory Visit (INDEPENDENT_AMBULATORY_CARE_PROVIDER_SITE_OTHER): Payer: Medicare Other | Admitting: Physician Assistant

## 2017-01-13 VITALS — BP 140/70 | HR 48 | Temp 97.4°F | Resp 16 | Wt 223.0 lb

## 2017-01-13 DIAGNOSIS — L03113 Cellulitis of right upper limb: Secondary | ICD-10-CM | POA: Diagnosis not present

## 2017-01-13 DIAGNOSIS — T300 Burn of unspecified body region, unspecified degree: Secondary | ICD-10-CM | POA: Diagnosis not present

## 2017-01-13 MED ORDER — CEPHALEXIN 500 MG PO CAPS: 500.0000 mg | ORAL_CAPSULE | Freq: Four times a day (QID) | ORAL | 0 refills | Status: DC

## 2017-01-13 MED ORDER — SILVER SULFADIAZINE 1 % EX CREA: 1.0000 "application " | TOPICAL_CREAM | Freq: Every day | CUTANEOUS | 0 refills | Status: DC
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
## 2017-01-17 ENCOUNTER — Encounter: Payer: Self-pay | Admitting: Physician Assistant

## 2017-01-17 ENCOUNTER — Ambulatory Visit (INDEPENDENT_AMBULATORY_CARE_PROVIDER_SITE_OTHER): Payer: Medicare Other | Admitting: Physician Assistant

## 2017-01-17 VITALS — BP 134/70 | HR 52 | Temp 98.0°F | Resp 14 | Wt 223.0 lb

## 2017-01-17 DIAGNOSIS — L239 Allergic contact dermatitis, unspecified cause: Secondary | ICD-10-CM | POA: Diagnosis not present

## 2017-01-17 MED ORDER — PREDNISONE 20 MG PO TABS: ORAL_TABLET | ORAL | 0 refills | Status: DC
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
## 2017-01-18 ENCOUNTER — Ambulatory Visit (HOSPITAL_COMMUNITY)
Admission: RE | Admit: 2017-01-18 | Discharge: 2017-01-18 | Disposition: A | Discharge status: Home / Self Care | Payer: Medicare Other | Source: Ambulatory Visit | Attending: Cardiology | Admitting: Cardiology

## 2017-01-18 ENCOUNTER — Encounter (HOSPITAL_COMMUNITY): Payer: Self-pay

## 2017-01-18 VITALS — BP 134/63 | HR 63 | Wt 222.5 lb

## 2017-01-18 DIAGNOSIS — Z853 Personal history of malignant neoplasm of breast: Secondary | ICD-10-CM | POA: Diagnosis not present

## 2017-01-18 DIAGNOSIS — Z809 Family history of malignant neoplasm, unspecified: Secondary | ICD-10-CM | POA: Insufficient documentation

## 2017-01-18 DIAGNOSIS — Z800 Family history of malignant neoplasm of digestive organs: Secondary | ICD-10-CM | POA: Insufficient documentation

## 2017-01-18 DIAGNOSIS — K219 Gastro-esophageal reflux disease without esophagitis: Secondary | ICD-10-CM | POA: Insufficient documentation

## 2017-01-18 DIAGNOSIS — E785 Hyperlipidemia, unspecified: Secondary | ICD-10-CM | POA: Diagnosis not present

## 2017-01-18 DIAGNOSIS — I4821 Permanent atrial fibrillation: Secondary | ICD-10-CM

## 2017-01-18 DIAGNOSIS — I110 Hypertensive heart disease with heart failure: Secondary | ICD-10-CM | POA: Diagnosis not present

## 2017-01-18 DIAGNOSIS — Z8249 Family history of ischemic heart disease and other diseases of the circulatory system: Secondary | ICD-10-CM | POA: Diagnosis not present

## 2017-01-18 DIAGNOSIS — I272 Pulmonary hypertension, unspecified: Secondary | ICD-10-CM | POA: Insufficient documentation

## 2017-01-18 DIAGNOSIS — I2510 Atherosclerotic heart disease of native coronary artery without angina pectoris: Secondary | ICD-10-CM | POA: Diagnosis not present

## 2017-01-18 DIAGNOSIS — I482 Chronic atrial fibrillation: Secondary | ICD-10-CM

## 2017-01-18 DIAGNOSIS — I712 Thoracic aortic aneurysm, without rupture: Secondary | ICD-10-CM | POA: Insufficient documentation

## 2017-01-18 DIAGNOSIS — Z7901 Long term (current) use of anticoagulants: Secondary | ICD-10-CM | POA: Diagnosis not present

## 2017-01-18 DIAGNOSIS — I5181 Takotsubo syndrome: Secondary | ICD-10-CM | POA: Insufficient documentation

## 2017-01-18 DIAGNOSIS — Z955 Presence of coronary angioplasty implant and graft: Secondary | ICD-10-CM | POA: Insufficient documentation

## 2017-01-18 DIAGNOSIS — I5032 Chronic diastolic (congestive) heart failure: Secondary | ICD-10-CM | POA: Insufficient documentation

## 2017-01-18 LAB — BASIC METABOLIC PANEL
Anion gap: 10 (ref 5–15)
BUN: 26 mg/dL — AB (ref 6–20)
CHLORIDE: 100 mmol/L — AB (ref 101–111)
CO2: 26 mmol/L (ref 22–32)
CREATININE: 1.18 mg/dL — AB (ref 0.44–1.00)
Calcium: 10 mg/dL (ref 8.9–10.3)
GFR calc Af Amer: 49 mL/min — ABNORMAL LOW (ref >60)
GFR calc non Af Amer: 42 mL/min — ABNORMAL LOW (ref >60)
GLUCOSE: 98 mg/dL (ref 65–99)
Potassium: 3.8 mmol/L (ref 3.5–5.1)
SODIUM: 136 mmol/L (ref 135–145)

## 2017-01-18 LAB — BRAIN NATRIURETIC PEPTIDE: B Natriuretic Peptide: 176 pg/mL — ABNORMAL HIGH (ref 0.0–100.0)

## 2017-01-18 LAB — CBC
HCT: 38.1 % (ref 36.0–46.0)
HEMOGLOBIN: 12.5 g/dL (ref 12.0–15.0)
MCH: 30.5 pg (ref 26.0–34.0)
MCHC: 32.8 g/dL (ref 30.0–36.0)
MCV: 92.9 fL (ref 78.0–100.0)
Platelets: 268 10*3/uL (ref 150–400)
RBC: 4.1 MIL/uL (ref 3.87–5.11)
RDW: 14 % (ref 11.5–15.5)
WBC: 9.7 10*3/uL (ref 4.0–10.5)

## 2017-01-18 MED ORDER — FUROSEMIDE 80 MG PO TABS: 80.0000 mg | ORAL_TABLET | Freq: Two times a day (BID) | ORAL | 3 refills | Status: DC

## 2017-01-18 NOTE — Patient Instructions (Signed)
Increase Furosemide to 80 mg Twice daily   Labs today  Labs in 2 weeks at [place] recommends that you schedule a follow-up appointment in: 6 weeks

## 2017-01-20 NOTE — Progress Notes (Signed)
Patient ID: [name], female   DOB: Jul 30, 1935, 82 y.o.   MRN: [id number]    Advanced Heart Failure Clinic Note   PCP: Karis Juba Cardiology: [name] HF Cardiology: Aundra Dubin  82 yo with history of poorly controlled HTN, chronic atrial fibrillation, chronic diastolic CHF, and prior episode of Takotsubo cardiomyopathy presents for CHF clinic appointment. Patient has had exertional dyspnea since 11/16.  At that time, she presented to the hospital with chest pain.  Coronary angiography showed no significant CAD.  EF was 35-40% by echo, suspected Takotsubo cardiomyopathy.  Her last echo in 3/17 showed EF improved to 41-32% but PA systolic pressure elevated at 65 mmHg.  She has had ongoing problems with exertional dyspnea and lower extremity edema.  At a prior appointment, we adjusted her BP meds and increased Lasix to 80 qam/40 qpm for volume overload.   She returns for followup today.  Just started on 5 days of prednisone for a rash.   Weight is up 2 lbs compared to last appointment.  She is short of breath with moderate housework or yardwork.  No dyspnea walking short distances.  No orthopnea/PND.  +Bendopnea.  No BRBPR/melena.  She has taken extra Lasix recently as she has felt like she is carrying excess fluid.   Labs (11/16): LDL 80 Labs (4/17): K 4.5, creatinine 1.1, BNP 132 Labs (5/17): LDL 92 Labs (3/18): K 4, creatinine 0.98, LDL 81, proBNP 834  PMH: 1. Hyperlipidemia: Myalgias with atorvastatin, Crestor, simvastatin. Myalgias with Zetia.  2. Chronic atrial fibrillation 3. HTN: Poor control.  4. H/o breast cancer: 2013.  5. GERD 6. Takotsubo cardiomyopathy: Admitted in 11/16 with chest pain.  Coronary angiography showed nonobstructive CAD.  Echo (11/16) showed EF 35-40%.   - Echo (3/17) with EF 60-65%, mild aortic stenosis, PA systolic pressure 65 mmHg, RV mildly dilated with normal systolic function.  - Cardiolite (4/17) with EF 61%, small reversible mid anteroseptal/apical lateral  defects thought to be related to shifting breast artifact => low risk study.  7. Chronic diastolic CHF 8. Ascending aortic aneurysm: 4.3 cm by CTA in 11/16.  9. Pulmonary hypertension: PASP 65 mmHg by echo in 3/17.  CTA chest in 11/16 with no PE.  PFTs in 7/15 with mild obstructive lung disease.  10. Sleep study negative 2017.  Social History   Social History  . Marital status: Married    Spouse name: N/A  . Number of children: N/A  . Years of education: N/A   Occupational History  . Not on file.   Social History Main Topics  . Smoking status: Never Smoker  . Smokeless tobacco: Never Used  . Alcohol use No  . Drug use: No  . Sexual activity: Not Currently   Other Topics Concern  . Not on file   Social History Narrative  . No narrative on file   Family History  Problem Relation Age of Onset  . Heart disease Mother   . Heart attack Mother   . Cancer Sister        stomach and colon  . Heart disease Brother 35  . Hypertension Father   . Cancer Sister   . Stroke Neg Hx    ROS: All systems reviewed and negative except as per HPI.   Current Outpatient Prescriptions  Medication Sig Dispense Refill  . acetaminophen (TYLENOL) 325 MG tablet Take 650 mg by mouth every 6 (six) hours as needed for mild pain. Reported on 03/15/2016    . amLODipine (NORVASC)  10 MG tablet TAKE ONE TABLET BY MOUTH ONCE DAILY 90 tablet 0  . cephALEXin (KEFLEX) 500 MG capsule Take 1 capsule (500 mg total) by mouth 4 (four) times daily. 28 capsule 0  . cetirizine (ZYRTEC) 10 MG tablet Take 1 tablet (10 mg total) by mouth daily. 30 tablet 11  . Cholecalciferol (VITAMIN D) 2000 UNITS CAPS Take 1 capsule by mouth daily as needed (for supplementation). Reported on 03/15/2016    . cloNIDine (CATAPRES) 0.1 MG tablet TAKE ONE TABLET BY MOUTH TWICE DAILY 60 tablet 11  . furosemide (LASIX) 80 MG tablet Take 1 tablet (80 mg total) by mouth 2 (two) times daily. 60 tablet 3  . metoprolol succinate (TOPROL-XL) 50 MG  24 hr tablet TAKE ONE TABLET BY MOUTH ONCE DAILY TAKE  WITH  OR  FOLLOWING  A  MEAL 90 tablet 3  . montelukast (SINGULAIR) 10 MG tablet Take 1 tablet (10 mg total) by mouth at bedtime. 30 tablet 3  . nitroGLYCERIN (NITROSTAT) 0.4 MG SL tablet Place 1 tablet (0.4 mg total) under the tongue every 5 (five) minutes as needed for chest pain (x 3 pills). 30 tablet 2  . omeprazole (PRILOSEC) 20 MG capsule Take 1 capsule (20 mg total) by mouth daily as needed (heartburn, acid reflux). 90 capsule 3  . OVER THE COUNTER MEDICATION Place 1 drop into both eyes daily as needed (dry eyes). Eye drops    . potassium chloride SA (K-DUR,KLOR-CON) 20 MEQ tablet Take 1 tablet (20 mEq total) by mouth daily. 30 tablet 3  . predniSONE (DELTASONE) 20 MG tablet Take 3 daily for 2 days, then 2 daily for 2 days, then 1 daily for 2 days. 12 tablet 0  . rosuvastatin (CRESTOR) 5 MG tablet Take 1 tablet once weekly and increase as tolerated. 30 tablet 3  . silver sulfADIAZINE (SILVADENE) 1 % cream Apply 1 application topically daily. 50 g 0  . spironolactone (ALDACTONE) 25 MG tablet TAKE ONE-HALF TABLET BY MOUTH ONCE DAILY 45 tablet 3  . triamcinolone cream (KENALOG) 0.1 % Apply 1 application topically 2 (two) times daily. 30 g 0  . valACYclovir (VALTREX) 1000 MG tablet Take 1 tablet (1,000 mg total) by mouth 3 (three) times daily. 21 tablet 0  . valsartan (DIOVAN) 320 MG tablet Take 1 tablet (320 mg total) by mouth daily. 90 tablet 3  . warfarin (COUMADIN) 1 MG tablet Take 0.5 tablets (0.5 mg total) by mouth as directed. M-W-F-SA 90 tablet 2  . warfarin (COUMADIN) 5 MG tablet Take 0.5 tablets (2.5 mg total) by mouth See admin instructions. Take half a tablet (2.5 mg) on Tuesdays, Thursdays, and Sundays. Take half a tablet with half a tablet of 1 mg (total 3 mg) on all other days. 90 tablet 2   No current facility-administered medications for this encounter.    BP 134/63   Pulse 63   Wt 222 lb 8 oz (100.9 kg)   SpO2 98%    BMI 35.37 kg/m    Wt Readings from Last 3 Encounters:  01/18/17 222 lb 8 oz (100.9 kg)  01/17/17 223 lb (101.2 kg)  01/13/17 223 lb (101.2 kg)     General: NAD Neck: JVP 8-9 cm, no thyromegaly or thyroid nodule.  Lungs: Crackles at bases bilaterally. CV: Nondisplaced PMI.  Heart irregular S1/S2, no S3/S4, 2/6 early SEM RUSB.  1+ ankle edema.  No carotid bruit.  Normal pedal pulses.  Abdomen: Soft, NT, ND, no HSM. No bruits or masses. +BS  Skin: Intact without lesions or rashes.  Neurologic: Alert and oriented x 3.  Psych: Normal affect. Extremities: No clubbing or cyanosis.  HEENT: Normal.   Assessment/Plan: 1. Chronic atrial fibrillation: Rate-controlled on Toprol XL.  Anticoagulated with warfarin. CBC today.  2. HTN: BP controlled.  Continue current regimen.  3. Chronic diastolic CHF: She looks mildly volume overloaded today on exam, NYHA class II-III.  - Increase Lasix to 80 mg bid, BMET/BNP today and repeat in 2 wks.  - Wear graded compression stockings during the day.  4. 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   5. Suspected OSA: Sleep study 01/2016 without significant OSA.  6. Hyperlipidemia: She has been intolerant of statins and Zetia.     Followup in 6 wks.   Loralie Champagne 01/20/2017

## 2017-02-01 ENCOUNTER — Telehealth: Payer: Self-pay | Admitting: Pharmacist

## 2017-02-01 ENCOUNTER — Other Ambulatory Visit: Payer: Medicare Other

## 2017-02-01 DIAGNOSIS — E785 Hyperlipidemia, unspecified: Secondary | ICD-10-CM

## 2017-02-01 LAB — HEPATIC FUNCTION PANEL
ALBUMIN: 4.3 g/dL (ref 3.5–4.7)
ALK PHOS: 82 IU/L (ref 39–117)
ALT: 18 IU/L (ref 0–32)
AST: 20 IU/L (ref 0–40)
BILIRUBIN, DIRECT: 0.14 mg/dL (ref 0.00–0.40)
Bilirubin Total: 0.4 mg/dL (ref 0.0–1.2)
Total Protein: 7.5 g/dL (ref 6.0–8.5)

## 2017-02-01 NOTE — Telephone Encounter (Signed)
Patient states she has been unable to tolerate rosuvastatin even once weekly due to bone aching. She took her last dose a few weeks ago and states the pain has improved. Based on record I did not have down that she has tried Lipitor (atorvastatin) she states today that she has tried atorvastatin and is not interested in rechallenging with any statin medication. She also reports she is uninterested in clinical trial or PCSK9i therapy at this time. She states she just wishes to cancel her lipid follow up appt. This has been done. Advised if she reconsidered or had any additional questions she call the clinic. She states understanding and appreciation for call.

## 2017-02-19 DIAGNOSIS — L03114 Cellulitis of left upper limb: Secondary | ICD-10-CM | POA: Diagnosis not present

## 2017-02-21 ENCOUNTER — Encounter: Payer: Self-pay | Admitting: Physician Assistant

## 2017-02-21 ENCOUNTER — Ambulatory Visit (INDEPENDENT_AMBULATORY_CARE_PROVIDER_SITE_OTHER): Payer: Medicare Other | Admitting: Physician Assistant

## 2017-02-21 VITALS — BP 126/60 | HR 55 | Temp 97.8°F | Resp 18 | Wt 226.4 lb

## 2017-02-21 DIAGNOSIS — L03119 Cellulitis of unspecified part of limb: Secondary | ICD-10-CM | POA: Diagnosis not present

## 2017-02-21 DIAGNOSIS — Z7901 Long term (current) use of anticoagulants: Secondary | ICD-10-CM | POA: Diagnosis not present

## 2017-02-21 DIAGNOSIS — L239 Allergic contact dermatitis, unspecified cause: Secondary | ICD-10-CM

## 2017-02-21 LAB — PT WITH INR/FINGERSTICK
INR, fingerstick: 3.1 — ABNORMAL HIGH (ref 0.9–1.1)
PT FINGERSTICK: 37.2 s — AB (ref 10.5–13.1)
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
## 2017-02-24 ENCOUNTER — Telehealth: Payer: Self-pay | Admitting: Physician Assistant

## 2017-02-24 NOTE — Telephone Encounter (Signed)
Patient calling to say her arm is not better would like a call back at [phone]

## 2017-02-24 NOTE — Telephone Encounter (Signed)
Called pt back she will need to be seen.Sch is full for today she will come in on 6/22 to be seen

## 2017-02-25 ENCOUNTER — Encounter: Payer: Self-pay | Admitting: Family Medicine

## 2017-02-25 ENCOUNTER — Ambulatory Visit (INDEPENDENT_AMBULATORY_CARE_PROVIDER_SITE_OTHER): Payer: Medicare Other | Admitting: Family Medicine

## 2017-02-25 VITALS — BP 110/48 | HR 58 | Temp 98.0°F | Resp 16 | Ht 67.0 in | Wt 222.0 lb

## 2017-02-25 DIAGNOSIS — L03114 Cellulitis of left upper limb: Secondary | ICD-10-CM

## 2017-02-25 MED ORDER — SULFAMETHOXAZOLE-TRIMETHOPRIM 800-160 MG PO TABS: 1.0000 | ORAL_TABLET | Freq: Two times a day (BID) | ORAL | 0 refills | Status: DC
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
## 2017-02-26 ENCOUNTER — Encounter (HOSPITAL_COMMUNITY): Payer: Self-pay

## 2017-02-26 ENCOUNTER — Emergency Department (HOSPITAL_COMMUNITY): Payer: Medicare Other

## 2017-02-26 ENCOUNTER — Emergency Department (HOSPITAL_COMMUNITY)
Admission: EM | Admit: 2017-02-26 | Discharge: 2017-02-26 | Disposition: A | Discharge status: Home / Self Care | Payer: Medicare Other | Attending: Emergency Medicine | Admitting: Emergency Medicine

## 2017-02-26 DIAGNOSIS — I2510 Atherosclerotic heart disease of native coronary artery without angina pectoris: Secondary | ICD-10-CM | POA: Diagnosis not present

## 2017-02-26 DIAGNOSIS — Z853 Personal history of malignant neoplasm of breast: Secondary | ICD-10-CM | POA: Insufficient documentation

## 2017-02-26 DIAGNOSIS — Z79899 Other long term (current) drug therapy: Secondary | ICD-10-CM | POA: Insufficient documentation

## 2017-02-26 DIAGNOSIS — R1111 Vomiting without nausea: Secondary | ICD-10-CM | POA: Diagnosis not present

## 2017-02-26 DIAGNOSIS — K5732 Diverticulitis of large intestine without perforation or abscess without bleeding: Secondary | ICD-10-CM | POA: Diagnosis not present

## 2017-02-26 DIAGNOSIS — I119 Hypertensive heart disease without heart failure: Secondary | ICD-10-CM | POA: Diagnosis not present

## 2017-02-26 DIAGNOSIS — K5792 Diverticulitis of intestine, part unspecified, without perforation or abscess without bleeding: Secondary | ICD-10-CM | POA: Insufficient documentation

## 2017-02-26 DIAGNOSIS — Z7901 Long term (current) use of anticoagulants: Secondary | ICD-10-CM | POA: Insufficient documentation

## 2017-02-26 DIAGNOSIS — Z7902 Long term (current) use of antithrombotics/antiplatelets: Secondary | ICD-10-CM | POA: Insufficient documentation

## 2017-02-26 DIAGNOSIS — R112 Nausea with vomiting, unspecified: Secondary | ICD-10-CM | POA: Diagnosis not present

## 2017-02-26 DIAGNOSIS — Z85038 Personal history of other malignant neoplasm of large intestine: Secondary | ICD-10-CM | POA: Diagnosis not present

## 2017-02-26 DIAGNOSIS — J449 Chronic obstructive pulmonary disease, unspecified: Secondary | ICD-10-CM | POA: Diagnosis not present

## 2017-02-26 DIAGNOSIS — I5032 Chronic diastolic (congestive) heart failure: Secondary | ICD-10-CM | POA: Insufficient documentation

## 2017-02-26 DIAGNOSIS — I482 Chronic atrial fibrillation: Secondary | ICD-10-CM | POA: Insufficient documentation

## 2017-02-26 DIAGNOSIS — R197 Diarrhea, unspecified: Secondary | ICD-10-CM

## 2017-02-26 LAB — I-STAT CG4 LACTIC ACID, ED
LACTIC ACID, VENOUS: 2.39 mmol/L — AB (ref 0.5–1.9)
Lactic Acid, Venous: 2.09 mmol/L (ref 0.5–1.9)

## 2017-02-26 LAB — COMPREHENSIVE METABOLIC PANEL
ALT: 23 U/L (ref 14–54)
ANION GAP: 13 (ref 5–15)
AST: 32 U/L (ref 15–41)
Albumin: 3.8 g/dL (ref 3.5–5.0)
Alkaline Phosphatase: 76 U/L (ref 38–126)
BUN: 24 mg/dL — ABNORMAL HIGH (ref 6–20)
CHLORIDE: 99 mmol/L — AB (ref 101–111)
CO2: 23 mmol/L (ref 22–32)
Calcium: 9 mg/dL (ref 8.9–10.3)
Creatinine, Ser: 1.37 mg/dL — ABNORMAL HIGH (ref 0.44–1.00)
GFR, EST AFRICAN AMERICAN: 41 mL/min — AB (ref >60)
GFR, EST NON AFRICAN AMERICAN: 35 mL/min — AB (ref >60)
Glucose, Bld: 131 mg/dL — ABNORMAL HIGH (ref 65–99)
POTASSIUM: 4.1 mmol/L (ref 3.5–5.1)
Sodium: 135 mmol/L (ref 135–145)
Total Bilirubin: 0.6 mg/dL (ref 0.3–1.2)
Total Protein: 7.2 g/dL (ref 6.5–8.1)

## 2017-02-26 LAB — CBC WITH DIFFERENTIAL/PLATELET
BASOS ABS: 0 10*3/uL (ref 0.0–0.1)
Basophils Relative: 0 %
EOS ABS: 0.1 10*3/uL (ref 0.0–0.7)
Eosinophils Relative: 1 %
HCT: 38.6 % (ref 36.0–46.0)
Hemoglobin: 12.5 g/dL (ref 12.0–15.0)
LYMPHS ABS: 0.6 10*3/uL — AB (ref 0.7–4.0)
LYMPHS PCT: 6 %
MCH: 29.8 pg (ref 26.0–34.0)
MCHC: 32.4 g/dL (ref 30.0–36.0)
MCV: 92.1 fL (ref 78.0–100.0)
Monocytes Absolute: 0.5 10*3/uL (ref 0.1–1.0)
Monocytes Relative: 4 %
NEUTROS PCT: 89 %
Neutro Abs: 9.8 10*3/uL — ABNORMAL HIGH (ref 1.7–7.7)
Platelets: 215 10*3/uL (ref 150–400)
RBC: 4.19 MIL/uL (ref 3.87–5.11)
RDW: 14.2 % (ref 11.5–15.5)
WBC: 10.9 10*3/uL — AB (ref 4.0–10.5)

## 2017-02-26 LAB — URINALYSIS, ROUTINE W REFLEX MICROSCOPIC
Bilirubin Urine: NEGATIVE
GLUCOSE, UA: NEGATIVE mg/dL
Ketones, ur: NEGATIVE mg/dL
LEUKOCYTES UA: NEGATIVE
Nitrite: NEGATIVE
PROTEIN: NEGATIVE mg/dL
Specific Gravity, Urine: 1.009 (ref 1.005–1.030)
pH: 5 (ref 5.0–8.0)

## 2017-02-26 LAB — LIPASE, BLOOD: LIPASE: 70 U/L — AB (ref 11–51)

## 2017-02-26 MED ORDER — AMOXICILLIN-POT CLAVULANATE 875-125 MG PO TABS
1.0000 | ORAL_TABLET | Freq: Once | ORAL | Status: AC
  Administered 2017-02-26: 1 via ORAL
  Filled 2017-02-26: qty 1

## 2017-02-26 MED ORDER — AMOXICILLIN-POT CLAVULANATE 875-125 MG PO TABS: 1.0000 | ORAL_TABLET | Freq: Two times a day (BID) | ORAL | 0 refills | Status: DC

## 2017-02-26 MED ORDER — ACETAMINOPHEN 325 MG PO TABS
650.0000 mg | ORAL_TABLET | Freq: Once | ORAL | Status: AC
  Administered 2017-02-26: 650 mg via ORAL
  Filled 2017-02-26: qty 2

## 2017-02-26 MED ORDER — ONDANSETRON 4 MG PO TBDP: 4.0000 mg | ORAL_TABLET | Freq: Three times a day (TID) | ORAL | 0 refills | Status: DC | PRN

## 2017-02-26 MED ORDER — BARIUM SULFATE 2.1 % PO SUSP
ORAL | Status: AC
  Filled 2017-02-26: qty 2

## 2017-02-26 MED ORDER — SODIUM CHLORIDE 0.9 % IV BOLUS (SEPSIS)
1000.0000 mL | Freq: Once | INTRAVENOUS | Status: AC
  Administered 2017-02-26: 1000 mL via INTRAVENOUS

## 2017-02-26 NOTE — ED Notes (Signed)
Pt ambulated to restroom and back to bed.

## 2017-02-26 NOTE — ED Notes (Signed)
Pt ambulated to the restroom with out difficulty.
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
## 2017-02-26 NOTE — ED Notes (Signed)
Dr Regenia Skeeter given a copy of lactic acid results 2.39
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
## 2017-02-26 NOTE — ED Notes (Signed)
Dr Regenia Skeeter given a copy of lactic acid results 2.09

## 2017-02-27 LAB — URINE CULTURE

## 2017-02-28 ENCOUNTER — Ambulatory Visit (INDEPENDENT_AMBULATORY_CARE_PROVIDER_SITE_OTHER): Payer: Medicare Other | Admitting: Physician Assistant

## 2017-02-28 ENCOUNTER — Encounter: Payer: Self-pay | Admitting: Physician Assistant

## 2017-02-28 VITALS — BP 130/62 | HR 51 | Temp 97.7°F | Resp 16 | Wt 227.2 lb

## 2017-02-28 DIAGNOSIS — L03114 Cellulitis of left upper limb: Secondary | ICD-10-CM | POA: Diagnosis not present
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
## 2017-03-03 ENCOUNTER — Encounter: Payer: Medicare Other | Attending: Surgery | Admitting: Surgery

## 2017-03-03 DIAGNOSIS — J449 Chronic obstructive pulmonary disease, unspecified: Secondary | ICD-10-CM | POA: Diagnosis not present

## 2017-03-03 DIAGNOSIS — I2510 Atherosclerotic heart disease of native coronary artery without angina pectoris: Secondary | ICD-10-CM | POA: Diagnosis not present

## 2017-03-03 DIAGNOSIS — Z7901 Long term (current) use of anticoagulants: Secondary | ICD-10-CM | POA: Diagnosis not present

## 2017-03-03 DIAGNOSIS — X58XXXA Exposure to other specified factors, initial encounter: Secondary | ICD-10-CM | POA: Insufficient documentation

## 2017-03-03 DIAGNOSIS — I10 Essential (primary) hypertension: Secondary | ICD-10-CM | POA: Insufficient documentation

## 2017-03-03 DIAGNOSIS — B359 Dermatophytosis, unspecified: Secondary | ICD-10-CM | POA: Diagnosis not present

## 2017-03-03 DIAGNOSIS — I4891 Unspecified atrial fibrillation: Secondary | ICD-10-CM | POA: Diagnosis not present

## 2017-03-03 DIAGNOSIS — Z79899 Other long term (current) drug therapy: Secondary | ICD-10-CM | POA: Insufficient documentation

## 2017-03-03 DIAGNOSIS — L235 Allergic contact dermatitis due to other chemical products: Secondary | ICD-10-CM | POA: Diagnosis not present

## 2017-03-03 DIAGNOSIS — Z9221 Personal history of antineoplastic chemotherapy: Secondary | ICD-10-CM | POA: Insufficient documentation

## 2017-03-03 DIAGNOSIS — M1990 Unspecified osteoarthritis, unspecified site: Secondary | ICD-10-CM | POA: Insufficient documentation

## 2017-03-03 DIAGNOSIS — D649 Anemia, unspecified: Secondary | ICD-10-CM | POA: Diagnosis not present

## 2017-03-03 DIAGNOSIS — Z85038 Personal history of other malignant neoplasm of large intestine: Secondary | ICD-10-CM | POA: Insufficient documentation

## 2017-03-03 DIAGNOSIS — T23072A Burn of unspecified degree of left wrist, initial encounter: Secondary | ICD-10-CM | POA: Diagnosis not present

## 2017-03-03 DIAGNOSIS — Z853 Personal history of malignant neoplasm of breast: Secondary | ICD-10-CM | POA: Diagnosis not present

## 2017-03-03 DIAGNOSIS — B369 Superficial mycosis, unspecified: Secondary | ICD-10-CM | POA: Diagnosis not present

## 2017-03-03 LAB — CULTURE, BLOOD (ROUTINE X 2)
CULTURE: NO GROWTH
Culture: NO GROWTH
SPECIAL REQUESTS: ADEQUATE
Special Requests: ADEQUATE
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
## 2017-03-07 ENCOUNTER — Encounter: Payer: Self-pay | Admitting: Physician Assistant

## 2017-03-07 ENCOUNTER — Ambulatory Visit (INDEPENDENT_AMBULATORY_CARE_PROVIDER_SITE_OTHER): Payer: Medicare Other | Admitting: Physician Assistant

## 2017-03-07 VITALS — BP 142/68 | HR 52 | Temp 98.2°F | Resp 14 | Wt 226.8 lb

## 2017-03-07 DIAGNOSIS — K921 Melena: Secondary | ICD-10-CM | POA: Diagnosis not present

## 2017-03-07 DIAGNOSIS — Z7901 Long term (current) use of anticoagulants: Secondary | ICD-10-CM

## 2017-03-07 DIAGNOSIS — C189 Malignant neoplasm of colon, unspecified: Secondary | ICD-10-CM

## 2017-03-07 LAB — PT WITH INR/FINGERSTICK
INR FINGERSTICK: 3.4 — AB (ref 0.9–1.1)
PT FINGERSTICK: 41.3 s — AB (ref 10.5–13.1)

## 2017-03-07 NOTE — Progress Notes (Signed)
MARCAYLA, BUDGE ([id number]) Visit Report for 03/03/2017 Allergy List Details Patient Name: [name]. Date of Service: 03/03/2017 8:00 AM Medical Record Number: [id number] Patient Account Number: [id number] Date of Birth/Sex: Oct 01, 1934 (82 y.o. Female) Treating RN: Susan Gouty, RN, BSN, [name] Primary Care Monzerrath Mcburney: Dena Billet Other Clinician: Referring [name]: Jenna Luo Treating [name]/Extender: Frann Rider in Treatment: 0 Allergies Active Allergies Iodinated Contrast Media - IV Dye Statins-Hmg-Coa Reductase Inhibitors Celebrex adhesive tape Allergy Notes Electronic Signature(s) Signed: 03/03/2017 3:15:17 PM By: Regan Lemming BSN, RN Entered By: Regan Lemming on 03/03/2017 08:23:15 [name] ([id number]) -------------------------------------------------------------------------------- Arrival Information Details Patient Name: [name], [name] Date of Service: 03/03/2017 8:00 AM Medical Record Number: [id number] Patient Account Number: [id number] Date of Birth/Sex: 1935-06-26 (82 y.o. Female) Treating RN: Afful, RN, BSN, [name] Primary Care Momodou Consiglio: Dena Billet Other Clinician: Referring [name]: Jenna Luo Treating [name]/Extender: Frann Rider in Treatment: 0 Visit Information Patient Arrived: Wheel Chair Arrival Time: 08:18 Accompanied By: self Transfer Assistance: None Patient Identification Verified: Yes Secondary Verification Process Yes Completed: Patient Requires Transmission-Based No Precautions: Patient Has Alerts: No History Since Last Visit All ordered tests and consults were completed: No Added or deleted any medications: No Any new allergies or adverse reactions: No Had a fall or experienced change in activities of daily living that [name] affect risk of falls: No Signs or symptoms of abuse/neglect since last visito No Hospitalized since last visit: No Has Dressing in Place as Prescribed: Yes Electronic Signature(s) Signed: 03/03/2017 9:52:07  AM By: Regan Lemming BSN, RN Entered By: Regan Lemming on 03/03/2017 09:52:07 [name] ([id number]) -------------------------------------------------------------------------------- Clinic Level of Care Assessment Details Patient Name: [name], [name] Date of Service: 03/03/2017 8:00 AM Medical Record Number: [id number] Patient Account Number: [id number] Date of Birth/Sex: [birth year] (82 y.o. Female) Treating RN: Afful, RN, BSN, [name] Primary Care Maylynn Orzechowski: Dena Billet Other Clinician: Referring [name]: Jenna Luo Treating [name]/Extender: Frann Rider in Treatment: 0 Clinic Level of Care Assessment Items TOOL 2 Quantity Score []  - Use when only an EandM is performed on the INITIAL visit 0 ASSESSMENTS - Nursing Assessment / Reassessment X - General Physical Exam (combine w/ comprehensive assessment (listed just 1 20 below) when performed on new pt. evals) X - Comprehensive Assessment (HX, ROS, Risk Assessments, Wounds Hx, etc.) 1 25 ASSESSMENTS - Wound and Skin Assessment / Reassessment X - Simple Wound Assessment / Reassessment - one wound 1 5 []  - Complex Wound Assessment / Reassessment - multiple wounds 0 []  - Dermatologic / Skin Assessment (not related to wound area) 0 ASSESSMENTS - Ostomy and/or Continence Assessment and Care []  - Incontinence Assessment and Management 0 []  - Ostomy Care Assessment and Management (repouching, etc.) 0 PROCESS - Coordination of Care X - Simple Patient / Family Education for ongoing care 1 15 []  - Complex (extensive) Patient / Family Education for ongoing care 0 X - Staff obtains Programmer, systems, Records, Test Results / Process Orders 1 10 X - Staff telephones HHA, Nursing Homes / Clarify orders / etc 1 10 []  - Routine Transfer to another Facility (non-emergent condition) 0 []  - Routine Hospital Admission (non-emergent condition) 0 []  - New Admissions / Biomedical engineer / Ordering NPWT, Apligraf, etc. 0 []  - Emergency Hospital Admission  (emergent condition) 0 []  - Simple Discharge Coordination 0 [name], [name] ([id number]) []  - Complex (extensive) Discharge Coordination 0 PROCESS - Special Needs []  - Pediatric / Minor Patient Management 0 []  - Isolation Patient Management 0 []  - Hearing /  Language / Visual special needs 0 []  - Assessment of Community assistance (transportation, D/C planning, etc.) 0 []  - Additional assistance / Altered mentation 0 []  - Support Surface(s) Assessment (bed, cushion, seat, etc.) 0 INTERVENTIONS - Wound Cleansing / Measurement X - Wound Imaging (photographs - any number of wounds) 1 5 []  - Wound Tracing (instead of photographs) 0 X - Simple Wound Measurement - one wound 1 5 []  - Complex Wound Measurement - multiple wounds 0 []  - Simple Wound Cleansing - one wound 0 []  - Complex Wound Cleansing - multiple wounds 0 INTERVENTIONS - Wound Dressings X - Small Wound Dressing one or multiple wounds 1 10 []  - Medium Wound Dressing one or multiple wounds 0 []  - Large Wound Dressing one or multiple wounds 0 []  - Application of Medications - injection 0 INTERVENTIONS - Miscellaneous []  - External ear exam 0 []  - Specimen Collection (cultures, biopsies, blood, body fluids, etc.) 0 []  - Specimen(s) / Culture(s) sent or taken to Lab for analysis 0 []  - Patient Transfer (multiple staff / Civil Service fast streamer / Similar devices) 0 []  - Simple Staple / Suture removal (25 or less) 0 []  - Complex Staple / Suture removal (26 or more) 0 [name], [name] ([id number]) []  - Hypo / Hyperglycemic Management (close monitor of Blood Glucose) 0 []  - Ankle / Brachial Index (ABI) - do not check if billed separately 0 Has the patient been seen at the hospital within the last three years: Yes Total Score: 105 Level Of Care: New/Established - Level 3 Electronic Signature(s) Signed: 03/03/2017 3:15:17 PM By: Regan Lemming BSN, RN Entered By: Regan Lemming on 03/03/2017 09:05:28 [name], Vivianne [name]  ([id number]) -------------------------------------------------------------------------------- Encounter Discharge Information Details Patient Name: Garver, Annielee M. Date of Service: 03/03/2017 8:00 AM Medical Record Number: [id number] Patient Account Number: [id number] Date of Birth/Sex: [birth year] (82 y.o. Female) Treating RN: Susan Gouty, RN, BSN, [name] Primary Care Ellwood Steidle: Dena Billet Other Clinician: Referring Kaisyn Reinhold: Jenna Luo Treating Shawnta Schlegel/Extender: Frann Rider in Treatment: 0 Encounter Discharge Information Items Schedule Follow-up Appointment: No Medication Reconciliation completed No and provided to Patient/Care Brittain Smithey: Provided on Clinical Summary of Care: 03/03/2017 Form Type Recipient Paper Patient LM Electronic Signature(s) Signed: 03/03/2017 9:03:06 AM By: Ruthine Dose Entered By: Ruthine Dose on 03/03/2017 09:03:06 Lashomb, Shanique M. ([id number]) -------------------------------------------------------------------------------- Lower Extremity Assessment Details Patient Name: [name], [name] Date of Service: 03/03/2017 8:00 AM Medical Record Number: [id number] Patient Account Number: [id number] Date of Birth/Sex: [birth year] (82 y.o. Female) Treating RN: Afful, RN, BSN, [employer] Primary Care Juana Haralson: Dena Billet Other Clinician: Referring Brianne Maina: Jenna Luo Treating Jaileigh Weimer/Extender: Frann Rider in Treatment: 0 Electronic Signature(s) Signed: 03/03/2017 3:15:17 PM By: Regan Lemming BSN, RN Entered By: Regan Lemming on 03/03/2017 08:23:07 [name] ([id number]) -------------------------------------------------------------------------------- Multi Wound Chart Details Patient Name: [name], [name] Date of Service: 03/03/2017 8:00 AM Medical Record Number: [id number] Patient Account Number: [id number] Date of Birth/Sex: 11/22/1934 (82 y.o. Female) Treating RN: Susan Gouty, RN, BSN, [name] Primary Care Anvita Hirata: Dena Billet Other Clinician: Referring Mavery Milling: Jenna Luo Treating Ramell Wacha/Extender: Frann Rider in Treatment: 0 Vital Signs Height(in): 67 Pulse(bpm): 46 Weight(lbs): 222 Blood Pressure 151/55 (mmHg): Body Mass Index(BMI): 35 Temperature(F): 98.1 Respiratory Rate 17 (breaths/min): Photos: [2:No Photos] [N/A:N/A] Wound Location: [2:Left Wrist] [N/A:N/A] Wounding Event: [2:Thermal Burn] [N/A:N/A] Primary Etiology: [2:1st degree Burn] [N/A:N/A] Comorbid History: [2:Cataracts, Anemia, Chronic Obstructive Pulmonary Disease (COPD), Coronary Artery Disease, Hypertension, Osteoarthritis, Received Chemotherapy] [N/A:N/A] Date Acquired: [2:01/11/2017] [N/A:N/A] Weeks of Treatment: [2:0] [N/A:N/A] Wound Status: [2:Open] [N/A:N/A]  Measurements L x W x D 8.5x13x0.1 [N/A:N/A] (cm) Area (cm) : [2:86.786] [N/A:N/A] Volume (cm) : [2:8.679] [N/A:N/A] Classification: [2:Partial Thickness] [N/A:N/A] Exudate Amount: [2:Small] [N/A:N/A] Exudate Type: [2:Serous] [N/A:N/A] Exudate Color: [2:amber] [N/A:N/A] Wound Margin: [2:Indistinct, nonvisible] [N/A:N/A] Granulation Amount: [2:Medium (34-66%)] [N/A:N/A] Granulation Quality: [2:Pink, Pale] [N/A:N/A] Necrotic Amount: [2:Small (1-33%)] [N/A:N/A] Exposed Structures: [2:Fascia: No Fat Layer (Subcutaneous Tissue) Exposed: No Tendon: No] [N/A:N/A] Muscle: No Joint: No Bone: No Limited to Skin Breakdown Epithelialization: None N/A N/A Periwound Skin Texture: Excoriation: No N/A N/A Induration: No Callus: No Crepitus: No Rash: No Scarring: No Periwound Skin Maceration: No N/A N/A Moisture: Dry/Scaly: No Periwound Skin Color: Erythema: Yes N/A N/A Atrophie Blanche: No Cyanosis: No Ecchymosis: No Hemosiderin Staining: No Mottled: No Pallor: No Rubor: No Erythema Location: Red Streaks N/A N/A Erythema Measurement: Measured N/A N/A Temperature: No Abnormality N/A N/A Tenderness on No N/A N/A Palpation: Wound Preparation: Ulcer Cleansing: N/A N/A Rinsed/Irrigated  with Saline Topical Anesthetic Applied: Other: lidocaine 4% Treatment Notes Electronic Signature(s) Signed: 03/03/2017 9:11:03 AM By: Christin Fudge MD, FACS Previous Signature: 03/03/2017 8:40:49 AM Version By: Regan Lemming BSN, RN Entered By: Christin Fudge on 03/03/2017 09:11:02 BRAYLYN, EYE ([id number]) -------------------------------------------------------------------------------- Multi-Disciplinary Care Plan Details Patient Name: [name]. Date of Service: 03/03/2017 8:00 AM Medical Record Number: [id number] Patient Account Number: [id number] Date of Birth/Sex: [birth year] (82 y.o. Female) Treating RN: Afful, RN, BSN, [name] Primary Care Raeann Offner: Dena Billet Other Clinician: Referring Heinz Eckert: Jenna Luo Treating Letetia Romanello/Extender: Frann Rider in Treatment: 0 Active Inactive ` Orientation to the Wound Care Program Nursing Diagnoses: Knowledge deficit related to the wound healing center program Goals: Patient/caregiver will verbalize understanding of the [facility] Program Date Initiated: 03/03/2017 Target Resolution Date: 06/03/2017 Goal Status: Active Interventions: Provide education on orientation to the wound center Notes: ` Wound/Skin Impairment Nursing Diagnoses: Impaired tissue integrity Knowledge deficit related to ulceration/compromised skin integrity Goals: Patient/caregiver will verbalize understanding of skin care regimen Date Initiated: 03/03/2017 Target Resolution Date: 06/03/2017 Goal Status: Active Ulcer/skin breakdown will have a volume reduction of 30% by week 4 Date Initiated: 03/03/2017 Target Resolution Date: 06/03/2017 Goal Status: Active Ulcer/skin breakdown will have a volume reduction of 50% by week 8 Date Initiated: 03/03/2017 Target Resolution Date: 06/03/2017 Goal Status: Active Ulcer/skin breakdown will have a volume reduction of 80% by week 12 Date Initiated: 03/03/2017 Target Resolution Date: 06/03/2017 Goal Status:  Active Ulcer/skin breakdown will heal within 14 weeks [name] ([id number]) Date Initiated: 03/03/2017 Target Resolution Date: 06/03/2017 Goal Status: Active Interventions: Assess patient/caregiver ability to obtain necessary supplies Assess patient/caregiver ability to perform ulcer/skin care regimen upon admission and as needed Assess ulceration(s) every visit Provide education on ulcer and skin care Treatment Activities: Referred to DME Almond Fitzgibbon for dressing supplies : 03/03/2017 Skin care regimen initiated : 03/03/2017 Topical wound management initiated : 03/03/2017 Notes: Electronic Signature(s) Signed: 03/03/2017 8:40:33 AM By: Regan Lemming BSN, RN Entered By: Regan Lemming on 03/03/2017 08:40:32 [name] ([id number]) -------------------------------------------------------------------------------- Pain Assessment Details Patient Name: [name], [name] Date of Service: 03/03/2017 8:00 AM Medical Record Number: [id number] Patient Account Number: [id number] Date of Birth/Sex: 1935/07/01 (82 y.o. Female) Treating RN: Afful, RN, BSN, [name] Primary Care Aser Nylund: Dena Billet Other Clinician: Referring Ayelen Sciortino: Jenna Luo Treating Deasiah Hagberg/Extender: Frann Rider in Treatment: 0 Active Problems Location of Pain Severity and Description of Pain Patient Has Paino No Site Locations With Dressing Change: No Pain Management and Medication Current Pain Management: Electronic Signature(s) Signed: 03/03/2017 3:15:17 PM By:  [name], [employer], RN Entered By: Regan Lemming on 03/03/2017 08:19:24 [name] ([id number]) -------------------------------------------------------------------------------- Wound Assessment Details Patient Name: [name], REINING. Date of Service: 03/03/2017 8:00 AM Medical Record Number: [id number] Patient Account Number: [id number] Date of Birth/Sex: 23-Nov-1934 (82 y.o. Female) Treating RN: Susan Gouty, RN, BSN, [name] Primary Care Damire Remedios: Dena Billet Other  Clinician: Referring Jesicca Dipierro: Jenna Luo Treating Chastity Noland/Extender: Frann Rider in Treatment: 0 Wound Status Wound Number: 2 Primary 1st degree Burn Etiology: Wound Location: Left Wrist Wound Open Wounding Event: Thermal Burn Status: Date Acquired: 01/11/2017 Comorbid Cataracts, Anemia, Chronic Obstructive Weeks Of Treatment: 0 History: Pulmonary Disease (COPD), Coronary Clustered Wound: No Artery Disease, Hypertension, Osteoarthritis, Received Chemotherapy Photos Photo Uploaded By: Regan Lemming on 03/03/2017 16:33:07 Wound Measurements Length: (cm) 8.5 % Reduction in Width: (cm) 13 % Reduction in Depth: (cm) 0.1 Epithelializat Area: (cm) 86.786 Tunneling: Volume: (cm) 8.679 Undermining: Area: Volume: ion: None No No Wound Description Classification: Partial Thickness Foul Odor Aft Wound Margin: Indistinct, nonvisible Slough/Fibrin Exudate Amount: Small WAYLON, KOFFLER ([id number]) er Cleansing: No o Yes Exudate Type: Serous Exudate Color: amber Wound Bed Granulation Amount: Medium (34-66%) Exposed Structure Granulation Quality: Pink, Pale Fascia Exposed: No Necrotic Amount: Small (1-33%) Fat Layer (Subcutaneous Tissue) Exposed: No Necrotic Quality: Adherent Slough Tendon Exposed: No Muscle Exposed: No Joint Exposed: No Bone Exposed: No Limited to Skin Breakdown Periwound Skin Texture Texture Color No Abnormalities Noted: No No Abnormalities Noted: No Callus: No Atrophie Blanche: No Crepitus: No Cyanosis: No Excoriation: No Ecchymosis: No Induration: No Erythema: Yes Rash: No Erythema Location: Red Streaks Scarring: No Erythema Measurement: Measured Hemosiderin Staining: No Moisture Mottled: No No Abnormalities Noted: No Pallor: No Dry / Scaly: No Rubor: No Maceration: No Temperature / Pain Temperature: No Abnormality Wound Preparation Ulcer Cleansing: Rinsed/Irrigated with Saline Topical Anesthetic Applied: Other:  lidocaine 4%, Electronic Signature(s) Signed: 03/03/2017 3:15:17 PM By: Regan Lemming BSN, RN Entered By: Regan Lemming on 03/03/2017 08:30:11 Chiarelli, Vivianne [name] ([id number]) -------------------------------------------------------------------------------- Vitals Details Patient Name: Leikam, Zella M. Date of Service: 03/03/2017 8:00 AM Medical Record Number: [id number] Patient Account Number: [id number] Date of Birth/Sex: 1935/08/15 (82 y.o. Female) Treating RN: Afful, RN, BSN, [place] Primary Care Callahan Wild: Dena Billet Other Clinician: Referring Isam Unrein: Jenna Luo Treating Kailyn Vanderslice/Extender: Frann Rider in Treatment: 0 Vital Signs Time Taken: 08:19 Temperature (F): 98.1 Height (in): 67 Pulse (bpm): 46 Source: Stated Respiratory Rate (breaths/min): 17 Weight (lbs): 222 Blood Pressure (mmHg): 151/55 Source: Measured Reference Range: 80 - 120 mg / dl Body Mass Index (BMI): 34.8 Electronic Signature(s) Signed: 03/03/2017 3:15:17 PM By: Regan Lemming BSN, RN Entered By: Regan Lemming on 03/03/2017 08:21:44

## 2017-03-07 NOTE — Progress Notes (Signed)
DESSIRE, GRIMES ([id number]) Visit Report for 03/03/2017 Abuse/Suicide Risk Screen Details Patient Name: [name], [name]. Date of Service: 03/03/2017 8:00 AM Medical Record Number: [id number] Patient Account Number: [id number] Date of Birth/Sex: [birth year] (82 y.o. Female) Treating RN: Afful, RN, BSN, Velva Harman Primary Care Tabbatha Bordelon: Dena Billet Other Clinician: Referring Gaetan Spieker: Jenna Luo Treating Floyce Bujak/Extender: Frann Rider in Treatment: 0 Abuse/Suicide Risk Screen Items Answer ABUSE/SUICIDE RISK SCREEN: Has anyone close to you tried to hurt or harm you recentlyo No Do you feel uncomfortable with anyone in your familyo No Has anyone forced you do things that you didnot want to doo No Do you have any thoughts of harming yourselfo No Patient displays signs or symptoms of abuse and/or neglect. No Electronic Signature(s) Signed: 03/03/2017 3:15:17 PM By: Regan Lemming BSN, RN Entered By: Regan Lemming on 03/03/2017 08:24:40 [place], [name] ([id number]) -------------------------------------------------------------------------------- Activities of Daily Living Details Patient Name: [name], [name] Date of Service: 03/03/2017 8:00 AM Medical Record Number: [id number] Patient Account Number: [id number] Date of Birth/Sex: [birth year] (82 y.o. Female) Treating RN: Afful, RN, BSN, Velva Harman Primary Care Courtany Mcmurphy: Dena Billet Other Clinician: Referring Cereniti Curb: Jenna Luo Treating Estuardo Frisbee/Extender: Frann Rider in Treatment: 0 Activities of Daily Living Items Answer Activities of Daily Living (Please select one for each item) Drive Automobile Completely Able Take Medications Completely Able Use Telephone Completely Able Care for Appearance Completely Able Use Toilet Completely Able Bath / Shower Completely Able Dress Self Completely Able Feed Self Completely Able Walk Completely Able Get In / Out Bed Completely Able Housework Completely Able Prepare Meals Completely [place] for Self Completely Able Electronic Signature(s) Signed: 03/03/2017 3:15:17 PM By: Regan Lemming BSN, RN Entered By: Regan Lemming on 03/03/2017 08:24:57 Mcquaig, [name] ([id number]) -------------------------------------------------------------------------------- Education Assessment Details Patient Name: [name], [name] Date of Service: 03/03/2017 8:00 AM Medical Record Number: [id number] Patient Account Number: [id number] Date of Birth/Sex: 1935/08/31 (82 y.o. Female) Treating RN: Afful, RN, BSN, Velva Harman Primary Care Julliette Frentz: Dena Billet Other Clinician: Referring Emaan Gary: Jenna Luo Treating Joaopedro Eschbach/Extender: Frann Rider in Treatment: 0 Primary Learner Assessed: Patient Learning Preferences/Education Level/Primary Language Learning Preference: Explanation Highest Education Level: High School Preferred Language: English Cognitive Barrier Assessment/Beliefs Language Barrier: No Physical Barrier Assessment Impaired Vision: Yes Glasses Impaired Hearing: No Decreased Hand dexterity: No Knowledge/Comprehension Assessment Knowledge Level: High Comprehension Level: High Ability to understand written High instructions: Ability to understand verbal High instructions: Motivation Assessment Anxiety Level: Calm Cooperation: Cooperative Education Importance: Acknowledges Need Interest in Health Problems: Asks Questions Perception: Coherent Willingness to Engage in Self- High Management Activities: Readiness to Engage in Self- High Management Activities: Electronic Signature(s) Signed: 03/03/2017 3:15:17 PM By: Regan Lemming BSN, RN Entered By: Regan Lemming on 03/03/2017 08:25:22 ARIES, KASA ([id number]) -------------------------------------------------------------------------------- Fall Risk Assessment Details Patient Name: [name], [name] Date of Service: 03/03/2017 8:00 AM Medical Record Number: [id number] Patient Account Number: [id number] Date of  Birth/Sex: 10-16-1934 (82 y.o. Female) Treating RN: Afful, RN, BSN, [place] Primary Care Adalaya Irion: Dena Billet Other Clinician: Referring Fay Swider: Jenna Luo Treating Zinia Innocent/Extender: Frann Rider in Treatment: 0 Fall Risk Assessment Items Have you had 2 or more falls in the last 12 monthso 0 No Have you had any fall that resulted in injury in the last 12 monthso 0 No FALL RISK ASSESSMENT: History of falling - immediate or within 3 months 0 No Secondary diagnosis 0 No Ambulatory aid None/bed rest/wheelchair/nurse 0 No Crutches/cane/walker 0 No Furniture 0 No IV Access/Saline Lock 0  No Gait/Training Normal/bed rest/immobile 0 Yes Weak 0 No Impaired 0 No Mental Status Oriented to own ability 0 Yes Electronic Signature(s) Signed: 03/03/2017 3:15:17 PM By: Regan Lemming BSN, RN Entered By: Regan Lemming on 03/03/2017 08:25:31 Dezarn, Lovelyn Jerilynn Mages ([id number]) -------------------------------------------------------------------------------- Foot Assessment Details Patient Name: [name], [name] Date of Service: 03/03/2017 8:00 AM Medical Record Number: [id number] Patient Account Number: [id number] Date of Birth/Sex: 04/03/1935 (82 y.o. Female) Treating RN: Afful, RN, BSN, Velva Harman Primary Care Toma Erichsen: Dena Billet Other Clinician: Referring Daune Divirgilio: Jenna Luo Treating Kersti Scavone/Extender: Frann Rider in Treatment: 0 Foot Assessment Items Site Locations + = Sensation present, - = Sensation absent, C = Callus, U = Ulcer R = Redness, W = Warmth, M = Maceration, PU = Pre-ulcerative lesion F = Fissure, S = Swelling, D = Dryness Assessment Right: Left: Other Deformity: No No Prior Foot Ulcer: No No Prior Amputation: No No Charcot Joint: No No Ambulatory Status: Ambulatory Without Help Gait: Steady Electronic Signature(s) Signed: 03/03/2017 3:15:17 PM By: Regan Lemming BSN, RN Entered By: Regan Lemming on 03/03/2017 08:25:44 Steward, [name]  ([id number]) -------------------------------------------------------------------------------- Nutrition Risk Assessment Details Patient Name: [name], [name] Date of Service: 03/03/2017 8:00 AM Medical Record Number: [id number] Patient Account Number: [id number] Date of Birth/Sex: Apr 04, 1935 (82 y.o. Female) Treating RN: Afful, RN, BSN, [place] Primary Care Flint Hakeem: Dena Billet Other Clinician: Referring Aria Jarrard: Jenna Luo Treating Betzy Barbier/Extender: Frann Rider in Treatment: 0 Height (in): 67 Weight (lbs): 222 Body Mass Index (BMI): 34.8 Nutrition Risk Assessment Items NUTRITION RISK SCREEN: I have an illness or condition that made me change the kind and/or 0 No amount of food I eat I eat fewer than two meals per day 0 No I eat few fruits and vegetables, or milk products 0 No I have three or more drinks of beer, liquor or wine almost every day 0 No I have tooth or mouth problems that make it hard for me to eat 0 No I don't always have enough money to buy the food I need 0 No I eat alone most of the time 0 No I take three or more different prescribed or over-the-counter drugs a 0 No day Without wanting to, I have lost or gained 10 pounds in the last six 0 No months I am not always physically able to shop, cook and/or feed myself 0 No Nutrition Protocols Good Risk Protocol 0 No interventions needed Moderate Risk Protocol Electronic Signature(s) Signed: 03/03/2017 3:15:17 PM By: Regan Lemming BSN, RN Entered By: Regan Lemming on 03/03/2017 08:25:37

## 2017-03-07 NOTE — Progress Notes (Signed)
Patient ID: [name] MRN: [id number], DOB: 1935-08-24, 82 y.o. Date of Encounter: 03/07/2017, 2:45 PM    Chief Complaint:  Chief Complaint  Patient presents with  . INR CHECK  . Blood In Stools     HPI: 82 y.o. year old female presents with above.   She states that yesterday and prior to yesterday she had felt what she is pretty sure was a hemorrhoid when she would wipe. Says that yesterday when she had bowel movement and wiped, she she saw some blood --or at least what she thinks looks like blood. Says that she also knows that sometimes if she has eaten something red etc. it can look like blood. However she also says that she "knows that is how the cancer started out ". She does have history of colon cancer.  Says that she had felt something that felt like a hemorrhoid for a couple of days up until yesterday but then did not feel that there anymore today and is wondering if a hemorrhoid burst or something and that was the cause of blood.  She reports that she has had no constipation. Stools have not been hard and she has not been having straining.  Wanted to recheck her INR to see if that was contributing.  No other complaints or concerns.     Home Meds:   Outpatient Medications Prior to Visit  Medication Sig Dispense Refill  . acetaminophen (TYLENOL) 325 MG tablet Take 650 mg by mouth every 6 (six) hours as needed for mild pain. Reported on 03/15/2016    . amLODipine (NORVASC) 10 MG tablet TAKE ONE TABLET BY MOUTH ONCE DAILY 90 tablet 0  . amoxicillin-clavulanate (AUGMENTIN) 875-125 MG tablet Take 1 tablet by mouth 2 (two) times daily. One po bid x 7 days 14 tablet 0  . cetirizine (ZYRTEC) 10 MG tablet Take 1 tablet (10 mg total) by mouth daily. 30 tablet 11  . Cholecalciferol (VITAMIN D) 2000 UNITS CAPS Take 1 capsule by mouth daily as needed (for supplementation). Reported on 03/15/2016    . cloNIDine (CATAPRES) 0.1 MG tablet TAKE ONE TABLET BY MOUTH TWICE DAILY 60 tablet  11  . furosemide (LASIX) 80 MG tablet Take 1 tablet (80 mg total) by mouth 2 (two) times daily. 60 tablet 3  . metoprolol succinate (TOPROL-XL) 50 MG 24 hr tablet TAKE ONE TABLET BY MOUTH ONCE DAILY TAKE  WITH  OR  FOLLOWING  A  MEAL 90 tablet 3  . montelukast (SINGULAIR) 10 MG tablet Take 1 tablet (10 mg total) by mouth at bedtime. 30 tablet 3  . nitroGLYCERIN (NITROSTAT) 0.4 MG SL tablet Place 1 tablet (0.4 mg total) under the tongue every 5 (five) minutes as needed for chest pain (x 3 pills). 30 tablet 2  . omeprazole (PRILOSEC) 20 MG capsule Take 1 capsule (20 mg total) by mouth daily as needed (heartburn, acid reflux). 90 capsule 3  . ondansetron (ZOFRAN ODT) 4 MG disintegrating tablet Take 1 tablet (4 mg total) by mouth every 8 (eight) hours as needed for nausea or vomiting. 8 tablet 0  . OVER THE COUNTER MEDICATION Place 1 drop into both eyes daily as needed (dry eyes). Eye drops    . potassium chloride SA (K-DUR,KLOR-CON) 20 MEQ tablet Take 1 tablet (20 mEq total) by mouth daily. 30 tablet 3  . rosuvastatin (CRESTOR) 5 MG tablet Take 1 tablet once weekly and increase as tolerated. 30 tablet 3  . silver sulfADIAZINE (SILVADENE) 1 %  cream Apply 1 application topically daily. 50 g 0  . spironolactone (ALDACTONE) 25 MG tablet TAKE ONE-HALF TABLET BY MOUTH ONCE DAILY 45 tablet 3  . triamcinolone cream (KENALOG) 0.1 % Apply 1 application topically 2 (two) times daily. 30 g 0  . valACYclovir (VALTREX) 1000 MG tablet Take 1 tablet (1,000 mg total) by mouth 3 (three) times daily. 21 tablet 0  . valsartan (DIOVAN) 320 MG tablet Take 1 tablet (320 mg total) by mouth daily. 90 tablet 3  . warfarin (COUMADIN) 1 MG tablet Take 0.5 tablets (0.5 mg total) by mouth as directed. M-W-F-SA 90 tablet 2  . warfarin (COUMADIN) 5 MG tablet Take 0.5 tablets (2.5 mg total) by mouth See admin instructions. Take half a tablet (2.5 mg) on Tuesdays, Thursdays, and Sundays. Take half a tablet with half a tablet of 1 mg  (total 3 mg) on all other days. 90 tablet 2  . sulfamethoxazole-trimethoprim (BACTRIM DS,SEPTRA DS) 800-160 MG tablet Take 1 tablet by mouth 2 (two) times daily. 14 tablet 0   No facility-administered medications prior to visit.     Allergies:  Allergies  Allergen Reactions  . Contrast Media [Iodinated Diagnostic Agents] Other (See Comments) and Hives    Per pt strong burning sensation starting in chest radiating outward Per pt strong burning sensation starting in chest radiating outward  . Clonidine Derivatives Other (See Comments)    Throat dry  . Statins Other (See Comments)    "bones hurt"  . Sulfa Antibiotics Diarrhea    Tremors   . Celebrex [Celecoxib] Rash  . Isosorbide Nitrate Itching and Rash  . Other Itching and Rash    Plastic and paper tape  . Tape Itching and Rash    Red Where applied and will spread      Review of Systems: See HPI for pertinent ROS. All other ROS negative.    Physical Exam: Blood pressure (!) 142/68, pulse (!) 52, temperature 98.2 F (36.8 C), temperature source Oral, resp. rate 14, weight 226 lb 12.8 oz (102.9 kg), SpO2 98 %., Body mass index is 35.52 kg/m. General:  Obese WF. Appears in no acute distress. Neck: Supple. No thyromegaly. No lymphadenopathy. Lungs: Clear bilaterally to auscultation without wheezes, rales, or rhonchi. Breathing is unlabored. Heart: Regular rhythm. No murmurs, rubs, or gallops. Abdomen: Soft, non-tender, non-distended with normoactive bowel sounds. No hepatomegaly. No rebound/guarding. No obvious abdominal masses. Msk:  Strength and tone normal for age. Extremities/Skin: Warm and dry. Anoscopy: Inspection shows external hemorrhoids---soft, nonthrombosed. One on left lateral side -- surface with raw area.   Neuro: Alert and oriented X 3. Moves all extremities spontaneously. Gait is normal. CNII-XII grossly in tact. Psych:  Responds to questions appropriately with a normal affect.     ASSESSMENT AND PLAN:  82  y.o. year old female with  1. Current use of long term anticoagulation - PT with INR/Fingerstick--- INR today 3.4. This was routinely checked here 2 weeks ago and was 3.1. All recent INRs have been therapeutic.  2. Hematochezia Suspect that hematochezia secondary to hemorrhoid but also discussed her h/o colon cancer--see # 3 below  3. Malignant neoplasm of colon, unspecified part of colon ([place]) According to pt-- she says that her last colonoscopy was 3 years ago and her last appointment with GI was 2 years ago. I am unable to confirm this with our records as she goes to [facility]. I discussed with her that I cannot say for certain that the hematochezia she has seen  is seeing is secondary to hemorrhoids and not secondary to cancer. Recommend follow-up with GI.   [address] [place], [place], [facility] 03/07/2017 2:45 PM

## 2017-03-11 ENCOUNTER — Ambulatory Visit (HOSPITAL_COMMUNITY)
Admission: RE | Admit: 2017-03-11 | Discharge: 2017-03-11 | Disposition: A | Discharge status: Home / Self Care | Payer: Medicare Other | Source: Ambulatory Visit | Attending: Cardiology | Admitting: Cardiology

## 2017-03-11 ENCOUNTER — Encounter: Payer: Medicare Other | Attending: Physician Assistant | Admitting: Physician Assistant

## 2017-03-11 ENCOUNTER — Encounter (HOSPITAL_COMMUNITY): Payer: Self-pay | Admitting: Cardiology

## 2017-03-11 VITALS — BP 126/74 | HR 51 | Wt 223.8 lb

## 2017-03-11 DIAGNOSIS — E785 Hyperlipidemia, unspecified: Secondary | ICD-10-CM | POA: Insufficient documentation

## 2017-03-11 DIAGNOSIS — K219 Gastro-esophageal reflux disease without esophagitis: Secondary | ICD-10-CM | POA: Insufficient documentation

## 2017-03-11 DIAGNOSIS — K921 Melena: Secondary | ICD-10-CM | POA: Insufficient documentation

## 2017-03-11 DIAGNOSIS — I272 Pulmonary hypertension, unspecified: Secondary | ICD-10-CM | POA: Diagnosis not present

## 2017-03-11 DIAGNOSIS — Z808 Family history of malignant neoplasm of other organs or systems: Secondary | ICD-10-CM | POA: Insufficient documentation

## 2017-03-11 DIAGNOSIS — I4891 Unspecified atrial fibrillation: Secondary | ICD-10-CM | POA: Diagnosis not present

## 2017-03-11 DIAGNOSIS — Z7901 Long term (current) use of anticoagulants: Secondary | ICD-10-CM | POA: Insufficient documentation

## 2017-03-11 DIAGNOSIS — L235 Allergic contact dermatitis due to other chemical products: Secondary | ICD-10-CM | POA: Diagnosis not present

## 2017-03-11 DIAGNOSIS — Z85038 Personal history of other malignant neoplasm of large intestine: Secondary | ICD-10-CM | POA: Diagnosis not present

## 2017-03-11 DIAGNOSIS — I2510 Atherosclerotic heart disease of native coronary artery without angina pectoris: Secondary | ICD-10-CM | POA: Insufficient documentation

## 2017-03-11 DIAGNOSIS — Z79899 Other long term (current) drug therapy: Secondary | ICD-10-CM | POA: Insufficient documentation

## 2017-03-11 DIAGNOSIS — I10 Essential (primary) hypertension: Secondary | ICD-10-CM | POA: Diagnosis not present

## 2017-03-11 DIAGNOSIS — X58XXXA Exposure to other specified factors, initial encounter: Secondary | ICD-10-CM | POA: Diagnosis not present

## 2017-03-11 DIAGNOSIS — B359 Dermatophytosis, unspecified: Secondary | ICD-10-CM | POA: Insufficient documentation

## 2017-03-11 DIAGNOSIS — I482 Chronic atrial fibrillation: Secondary | ICD-10-CM | POA: Diagnosis not present

## 2017-03-11 DIAGNOSIS — T23072A Burn of unspecified degree of left wrist, initial encounter: Secondary | ICD-10-CM | POA: Diagnosis present

## 2017-03-11 DIAGNOSIS — I712 Thoracic aortic aneurysm, without rupture: Secondary | ICD-10-CM | POA: Diagnosis not present

## 2017-03-11 DIAGNOSIS — Z8249 Family history of ischemic heart disease and other diseases of the circulatory system: Secondary | ICD-10-CM | POA: Diagnosis not present

## 2017-03-11 DIAGNOSIS — I4821 Permanent atrial fibrillation: Secondary | ICD-10-CM

## 2017-03-11 DIAGNOSIS — Z853 Personal history of malignant neoplasm of breast: Secondary | ICD-10-CM | POA: Insufficient documentation

## 2017-03-11 DIAGNOSIS — I5032 Chronic diastolic (congestive) heart failure: Secondary | ICD-10-CM | POA: Insufficient documentation

## 2017-03-11 DIAGNOSIS — I110 Hypertensive heart disease with heart failure: Secondary | ICD-10-CM | POA: Insufficient documentation

## 2017-03-11 DIAGNOSIS — T23072D Burn of unspecified degree of left wrist, subsequent encounter: Secondary | ICD-10-CM | POA: Diagnosis not present

## 2017-03-11 LAB — BASIC METABOLIC PANEL
ANION GAP: 9 (ref 5–15)
BUN: 25 mg/dL — ABNORMAL HIGH (ref 6–20)
CHLORIDE: 100 mmol/L — AB (ref 101–111)
CO2: 27 mmol/L (ref 22–32)
Calcium: 9.5 mg/dL (ref 8.9–10.3)
Creatinine, Ser: 1.18 mg/dL — ABNORMAL HIGH (ref 0.44–1.00)
GFR calc non Af Amer: 42 mL/min — ABNORMAL LOW (ref >60)
GFR, EST AFRICAN AMERICAN: 49 mL/min — AB (ref >60)
GLUCOSE: 111 mg/dL — AB (ref 65–99)
Potassium: 4 mmol/L (ref 3.5–5.1)
Sodium: 136 mmol/L (ref 135–145)

## 2017-03-11 LAB — CBC
HCT: 36.4 % (ref 36.0–46.0)
HEMOGLOBIN: 11.6 g/dL — AB (ref 12.0–15.0)
MCH: 29.5 pg (ref 26.0–34.0)
MCHC: 31.9 g/dL (ref 30.0–36.0)
MCV: 92.6 fL (ref 78.0–100.0)
PLATELETS: 262 10*3/uL (ref 150–400)
RBC: 3.93 MIL/uL (ref 3.87–5.11)
RDW: 14.6 % (ref 11.5–15.5)
WBC: 7.5 10*3/uL (ref 4.0–10.5)

## 2017-03-11 LAB — PROTIME-INR
INR: 2.55
Prothrombin Time: 27.9 seconds — ABNORMAL HIGH (ref 11.4–15.2)

## 2017-03-11 LAB — BRAIN NATRIURETIC PEPTIDE: B Natriuretic Peptide: 163 pg/mL — ABNORMAL HIGH (ref 0.0–100.0)

## 2017-03-11 MED ORDER — TORSEMIDE 20 MG PO TABS: 80.0000 mg | ORAL_TABLET | Freq: Every day | ORAL | 6 refills | Status: DC

## 2017-03-11 NOTE — Patient Instructions (Signed)
STOP Lasix.  START Torsemide 80 mg (4 tabs) once daily.  Routine lab work today. Will notify you of abnormal results, otherwise no news is good news!  Return in 1 week for labs.  Follow up 2 weeks. Take all medication as prescribed the day of your appointment. Bring all medications with you to your appointment.  Do the following things EVERYDAY: 1) Weigh yourself in the morning before breakfast. Write it down and keep it in a log. 2) Take your medicines as prescribed 3) Eat low salt foods-Limit salt (sodium) to 2000 mg per day.  4) Stay as active as you can everyday 5) Limit all fluids for the day to less than 2 liters

## 2017-03-12 NOTE — Progress Notes (Signed)
Patient ID: [name], female   DOB: 1934-11-25, 82 y.o.   MRN: [id number]    Advanced Heart Failure Clinic Note   PCP: Karis Juba Cardiology: [name] HF Cardiology: Aundra Dubin  82 yo with history of poorly controlled HTN, chronic atrial fibrillation, chronic diastolic CHF, and prior episode of Takotsubo cardiomyopathy presents for CHF clinic appointment. Patient has had exertional dyspnea since 11/16.  At that time, she presented to the hospital with chest pain.  Coronary angiography showed no significant CAD.  EF was 35-40% by echo, suspected Takotsubo cardiomyopathy.  Her last echo in 3/17 showed EF improved to 93-23% but PA systolic pressure elevated at 65 mmHg.  She has had ongoing problems with exertional dyspnea and lower extremity edema.  At a prior appointment, we adjusted her BP meds and increased Lasix to 80 qam/40 qpm for volume overload.   She returns for followup today. Weight is up 1 lb compared to last appointment.  She is short of breath walking up a hill but generally ok walking short distances.  She is able to get around the grocery store.  No orthopnea/PND.  She has noted occasional hematochezia since Monday.   Labs (11/16): LDL 80 Labs (4/17): K 4.5, creatinine 1.1, BNP 132 Labs (5/17): LDL 92 Labs (3/18): K 4, creatinine 0.98, LDL 81, proBNP 834 Labs (6/18): K 4.1, creatinine 1.37, hgb 12.5  PMH: 1. Hyperlipidemia: Myalgias with atorvastatin, Crestor, simvastatin. Myalgias with Zetia.  2. Chronic atrial fibrillation 3. HTN: Poor control.  4. H/o breast cancer: 2013.  5. GERD 6. Takotsubo cardiomyopathy: Admitted in 11/16 with chest pain.  Coronary angiography showed nonobstructive CAD.  Echo (11/16) showed EF 35-40%.   - Echo (3/17) with EF 60-65%, mild aortic stenosis, PA systolic pressure 65 mmHg, RV mildly dilated with normal systolic function.  - Cardiolite (4/17) with EF 61%, small reversible mid anteroseptal/apical lateral defects thought to be related to shifting  breast artifact => low risk study.  7. Chronic diastolic CHF 8. Ascending aortic aneurysm: 4.3 cm by CTA in 11/16.  9. Pulmonary hypertension: PASP 65 mmHg by echo in 3/17.  CTA chest in 11/16 with no PE.  PFTs in 7/15 with mild obstructive lung disease.  10. Sleep study negative 2017.  Social History   Social History  . Marital status: Married    Spouse name: N/A  . Number of children: N/A  . Years of education: N/A   Occupational History  . Not on file.   Social History Main Topics  . Smoking status: Never Smoker  . Smokeless tobacco: Never Used  . Alcohol use No  . Drug use: No  . Sexual activity: Not Currently   Other Topics Concern  . Not on file   Social History Narrative  . No narrative on file   Family History  Problem Relation Age of Onset  . Heart disease Mother   . Heart attack Mother   . Cancer Sister        stomach and colon  . Heart disease Brother 35  . Hypertension Father   . Cancer Sister   . Stroke Neg Hx    ROS: All systems reviewed and negative except as per HPI.   Current Outpatient Prescriptions  Medication Sig Dispense Refill  . acetaminophen (TYLENOL) 325 MG tablet Take 650 mg by mouth every 6 (six) hours as needed for mild pain. Reported on 03/15/2016    . amLODipine (NORVASC) 10 MG tablet TAKE ONE TABLET BY MOUTH ONCE DAILY  90 tablet 0  . cetirizine (ZYRTEC) 10 MG tablet Take 1 tablet (10 mg total) by mouth daily. 30 tablet 11  . cloNIDine (CATAPRES) 0.1 MG tablet TAKE ONE TABLET BY MOUTH TWICE DAILY 60 tablet 11  . metoprolol succinate (TOPROL-XL) 50 MG 24 hr tablet TAKE ONE TABLET BY MOUTH ONCE DAILY TAKE  WITH  OR  FOLLOWING  A  MEAL 90 tablet 3  . montelukast (SINGULAIR) 10 MG tablet Take 1 tablet (10 mg total) by mouth at bedtime. 30 tablet 3  . nitroGLYCERIN (NITROSTAT) 0.4 MG SL tablet Place 1 tablet (0.4 mg total) under the tongue every 5 (five) minutes as needed for chest pain (x 3 pills). 30 tablet 2  . omeprazole (PRILOSEC) 20 MG  capsule Take 1 capsule (20 mg total) by mouth daily as needed (heartburn, acid reflux). 90 capsule 3  . OVER THE COUNTER MEDICATION Place 1 drop into both eyes daily as needed (dry eyes). Eye drops    . spironolactone (ALDACTONE) 25 MG tablet TAKE ONE-HALF TABLET BY MOUTH ONCE DAILY 45 tablet 3  . triamcinolone cream (KENALOG) 0.1 % Apply 1 application topically 2 (two) times daily. 30 g 0  . valACYclovir (VALTREX) 1000 MG tablet Take 1 tablet (1,000 mg total) by mouth 3 (three) times daily. 21 tablet 0  . valsartan (DIOVAN) 320 MG tablet Take 1 tablet (320 mg total) by mouth daily. 90 tablet 3  . warfarin (COUMADIN) 1 MG tablet Take 0.5 tablets (0.5 mg total) by mouth as directed. M-W-F-SA 90 tablet 2  . warfarin (COUMADIN) 5 MG tablet Take 0.5 tablets (2.5 mg total) by mouth See admin instructions. Take half a tablet (2.5 mg) on Tuesdays, Thursdays, and Sundays. Take half a tablet with half a tablet of 1 mg (total 3 mg) on all other days. 90 tablet 2  . ondansetron (ZOFRAN ODT) 4 MG disintegrating tablet Take 1 tablet (4 mg total) by mouth every 8 (eight) hours as needed for nausea or vomiting. (Patient not taking: Reported on 03/11/2017) 8 tablet 0  . torsemide (DEMADEX) 20 MG tablet Take 4 tablets (80 mg total) by mouth daily. 120 tablet 6   No current facility-administered medications for this encounter.    BP 126/74 (BP Location: Right Arm, Patient Position: Sitting, Cuff Size: Normal)   Pulse (!) 51   Wt 223 lb 12.8 oz (101.5 kg)   SpO2 97%   BMI 35.05 kg/m    Wt Readings from Last 3 Encounters:  03/11/17 223 lb 12.8 oz (101.5 kg)  03/07/17 226 lb 12.8 oz (102.9 kg)  02/28/17 227 lb 3.2 oz (103.1 kg)     General: NAD Neck: JVP 9-10 cm, no thyromegaly or thyroid nodule.  Lungs: Clear to auscultation bilaterally. CV: Nondisplaced PMI.  Heart irregular S1/S2, no S3/S4, 2/6 early SEM RUSB.  Trace ankle edema.  No carotid bruit.  Normal pedal pulses.  Abdomen: Soft, NT, ND, no HSM. No  bruits or masses. +BS  Skin: Intact without lesions or rashes.  Neurologic: Alert and oriented x 3.  Psych: Normal affect. Extremities: No clubbing or cyanosis.  HEENT: Normal.   Assessment/Plan: 1. Chronic atrial fibrillation: Rate-controlled on Toprol XL.  Anticoagulated with warfarin.   2. HTN: BP controlled.  Continue current regimen.  3. Chronic diastolic CHF: She looks volume overloaded today on exam, NYHA class II-III. She is already taking a high dose of Lasix.  - Stop Lasix, start torsemide 80 mg daily.  BMET/BNP today and repeat in 10  days.  - Wear graded compression stockings during the day.  4. 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   5. Hematochezia: Since Monday, not large amounts.  Check CBC and INR today.  She has appointment with [facility] on Tuesday.  6. Hyperlipidemia: She has been intolerant of statins and Zetia.     Followup in 2 wks with PA/NP to reassess volume.   Loralie Champagne 03/12/2017

## 2017-03-12 NOTE — Progress Notes (Signed)
Susan, [name] ([id number]) Visit Report for 03/11/2017 Arrival Information Details Patient Name: [name], [name]. Date of Service: 03/11/2017 2:00 PM Medical Record Number: [id number] Patient Account Number: [id number] Date of Birth/Sex: 23-Jun-1935 (82 y.o. Female) Treating RN: [name], [name] Primary Care [name]: [name] Other Clinician: Referring [name]: [name] Treating [name]/Extender: [name], [name]: 1 Visit Information History Since Last Visit All ordered tests and consults were completed: No Patient Arrived: Ambulatory Added or deleted any medications: No Arrival Time: 14:09 Any new allergies or adverse reactions: No Accompanied By: hubby in lobby Had a fall or experienced change in No activities of daily living that [place] affect Transfer Assistance: None risk of falls: Patient Identification Verified: Yes Signs or symptoms of abuse/neglect since last No Secondary Verification Process Yes visito Completed: Hospitalized since last visit: No Patient Requires Transmission-Based No Has Dressing in Place as Prescribed: Yes Precautions: Pain Present Now: No Patient Has Alerts: No Electronic Signature(s) Signed: 03/11/2017 4:15:16 PM By: [name] BSN, RN Entered By: [name] on 03/11/2017 14:10:53 [name] ([id number]) -------------------------------------------------------------------------------- Clinic Level of Care Assessment Details Patient Name: [name], [name] Date of Service: 03/11/2017 2:00 PM Medical Record Number: [id number] Patient Account Number: [id number] Date of Birth/Sex: [birth year] (82 y.o. Female) Treating RN: [name], Administrator, sports Primary Care [name]: [name] Other Clinician: Referring [name]: [name] Treating [name]/Extender: [name], [name]: 1 Clinic Level of Care Assessment Items TOOL 4 Quantity Score []  - Use when only an EandM is performed on FOLLOW-UP visit 0 ASSESSMENTS - Nursing  Assessment / Reassessment X - Reassessment of Co-morbidities (includes updates in patient status) 1 10 X - Reassessment of Adherence to [name] Plan 1 5 ASSESSMENTS - Wound and Skin Assessment / Reassessment X - Simple Wound Assessment / Reassessment - one wound 1 5 []  - Complex Wound Assessment / Reassessment - multiple wounds 0 []  - Dermatologic / Skin Assessment (not related to wound area) 0 ASSESSMENTS - Focused Assessment []  - Circumferential Edema Measurements - multi extremities 0 []  - Nutritional Assessment / Counseling / Intervention 0 []  - Lower Extremity Assessment (monofilament, tuning fork, pulses) 0 []  - Peripheral Arterial Disease Assessment (using hand held doppler) 0 ASSESSMENTS - Ostomy and/or Continence Assessment and Care []  - Incontinence Assessment and Management 0 []  - Ostomy Care Assessment and Management (repouching, etc.) 0 PROCESS - Coordination of Care X - Simple Patient / Family Education for ongoing care 1 15 []  - Complex (extensive) Patient / Family Education for ongoing care 0 []  - Staff obtains Programmer, systems, Records, Test Results / Process Orders 0 []  - Staff telephones HHA, Nursing Homes / Clarify orders / etc 0 []  - Routine Transfer to another Facility (non-emergent condition) 0 [name], [name] ([id number]) []  - Routine Hospital Admission (non-emergent condition) 0 []  - New Admissions / Biomedical engineer / Ordering NPWT, Apligraf, etc. 0 []  - Emergency Hospital Admission (emergent condition) 0 []  - Simple Discharge Coordination 0 []  - Complex (extensive) Discharge Coordination 0 PROCESS - Special Needs []  - Pediatric / Minor Patient Management 0 []  - Isolation Patient Management 0 []  - Hearing / Language / Visual special needs 0 []  - Assessment of Community assistance (transportation, D/C planning, etc.) 0 []  - Additional assistance / Altered mentation 0 []  - Support Surface(s) Assessment (bed, cushion, seat, etc.) 0 INTERVENTIONS - Wound  Cleansing / Measurement X - Simple Wound Cleansing - one wound 1 5 []  - Complex Wound Cleansing - multiple  wounds 0 X - Wound Imaging (photographs - any number of wounds) 1 5 []  - Wound Tracing (instead of photographs) 0 X - Simple Wound Measurement - one wound 1 5 []  - Complex Wound Measurement - multiple wounds 0 INTERVENTIONS - Wound Dressings X - Small Wound Dressing one or multiple wounds 1 10 []  - Medium Wound Dressing one or multiple wounds 0 []  - Large Wound Dressing one or multiple wounds 0 []  - Application of Medications - topical 0 []  - Application of Medications - injection 0 INTERVENTIONS - Miscellaneous []  - External ear exam 0 [name], [name] ([id number]) []  - Specimen Collection (cultures, biopsies, blood, body fluids, etc.) 0 []  - Specimen(s) / Culture(s) sent or taken to Lab for analysis 0 []  - Patient Transfer (multiple staff / [name] Lift / Similar devices) 0 []  - Simple Staple / Suture removal (25 or less) 0 []  - Complex Staple / Suture removal (26 or more) 0 []  - Hypo / Hyperglycemic Management (close monitor of Blood Glucose) 0 []  - Ankle / Brachial Index (ABI) - do not check if billed separately 0 X - Vital Signs 1 5 Has the patient been seen at the hospital within the last three years: Yes Total Score: 65 Level Of Care: New/Established - Level 2 Electronic Signature(s) Signed: 03/11/2017 4:15:16 PM By: [name] BSN, RN Entered By: [name] on 03/11/2017 14:20:41 [name] ([id number]) -------------------------------------------------------------------------------- Encounter Discharge Information Details Patient Name: [name], [name] Date of Service: 03/11/2017 2:00 PM Medical Record Number: [id number] Patient Account Number: [id number] Date of Birth/Sex: 05-30-1935 (82 y.o. Female) Treating RN: [name], [name] Primary Care Roverto Bodmer: [name] Other Clinician: Referring Kenedi Cilia: [name] Treating Tranell Wojtkiewicz/Extender: [name], Susan Weeks in  [name]: 1 Encounter Discharge Information Items Discharge Pain Level: 0 Discharge Condition: Stable Ambulatory Status: Ambulatory Discharge Destination: Home Transportation: Private Auto Accompanied By: hubby in lobby Schedule Follow-up Appointment: No Medication Reconciliation completed and provided to Patient/Care No Nikiah Goin: Provided on Clinical Summary of Care: 03/11/2017 Form Type Recipient Paper Patient LM Electronic Signature(s) Signed: 03/11/2017 4:15:16 PM By: [name] BSN, RN Previous Signature: 03/11/2017 2:20:33 PM Version By: Ruthine Dose Entered By: [name] on 03/11/2017 14:21:22 Closson, [name] ([id number]) -------------------------------------------------------------------------------- Lower Extremity Assessment Details Patient Name: Shadrick, [name] Date of Service: 03/11/2017 2:00 PM Medical Record Number: [id number] Patient Account Number: [id number] Date of Birth/Sex: [birth year] (82 y.o. Female) Treating RN: [name], [employer] Primary Care Matyas Baisley: [name] Other Clinician: Referring Manika Hast: [name] Treating Wynonna Fitzhenry/Extender: [name], [name]: 1 Electronic Signature(s) Signed: 03/11/2017 4:15:16 PM By: [name] BSN, RN Entered By: [name] on 03/11/2017 14:11:09 Linville, [name] ([id number]) -------------------------------------------------------------------------------- Multi Wound Chart Details Patient Name: Vanhandel, [name] Date of Service: 03/11/2017 2:00 PM Medical Record Number: [id number] Patient Account Number: [id number] Date of Birth/Sex: March 18, 1935 (82 y.o. Female) Treating RN: Baruch Gouty, RN, [name], [name] Primary Care Lynard Postlewait: [name] Other Clinician: Referring Trinty Marken: [name] Treating Joya Willmott/Extender: [name], [name]: 1 Vital Signs Height(in): 67 Pulse(bpm): 46 Weight(lbs): 222 Blood Pressure 128/52 (mmHg): Body Mass Index(BMI): 35 Temperature(F): 98.2 Respiratory  Rate 16 (breaths/min): Photos: [2:No Photos] [N/A:N/A] Wound Location: [2:Left Wrist] [N/A:N/A] Wounding Event: [2:Thermal Burn] [N/A:N/A] Primary Etiology: [2:1st degree Burn] [N/A:N/A] Comorbid History: [2:Cataracts, Anemia, Chronic Obstructive Pulmonary Disease (COPD), Coronary Artery Disease, Hypertension, Osteoarthritis, Received Chemotherapy] [N/A:N/A] Date Acquired: [2:01/11/2017] [N/A:N/A] Weeks of [name]: [2:1] [N/A:N/A] Wound Status: [2:Healed - Epithelialized] [N/A:N/A] Measurements L x W x D  0x0x0 [N/A:N/A] (cm) Area (cm) : [2:0] [N/A:N/A] Volume (cm) : [2:0] [N/A:N/A] % Reduction in Area: [2:100.00%] [N/A:N/A] % Reduction in Volume: 100.00% [N/A:N/A] Classification: [2:Partial Thickness] [N/A:N/A] Exudate Amount: [2:None Present] [N/A:N/A] Wound Margin: [2:Flat and Intact] [N/A:N/A] Granulation Amount: [2:None Present (0%)] [N/A:N/A] Necrotic Amount: [2:Small (1-33%)] [N/A:N/A] Exposed Structures: [2:Fascia: No Fat Layer (Subcutaneous Tissue) Exposed: No Tendon: No Muscle: No] [N/A:N/A] Joint: No Bone: No Epithelialization: Large (67-100%) N/A N/A Periwound Skin Texture: Excoriation: No N/A N/A Induration: No Callus: No Crepitus: No Rash: No Scarring: No Periwound Skin Maceration: No N/A N/A Moisture: Dry/Scaly: No Periwound Skin Color: Atrophie Blanche: No N/A N/A Cyanosis: No Ecchymosis: No Erythema: No Hemosiderin Staining: No Mottled: No Pallor: No Rubor: No Temperature: No Abnormality N/A N/A Tenderness on No N/A N/A Palpation: Wound Preparation: Ulcer Cleansing: N/A N/A Rinsed/Irrigated with Saline Topical Anesthetic Applied: None [name] Notes Electronic Signature(s) Signed: 03/11/2017 4:15:16 PM By: [name] BSN, RN Entered By: [name] on 03/11/2017 14:17:16 [name], [name] ([id number]) -------------------------------------------------------------------------------- [place] Details Patient Name: Gonser, Susan  M. Date of Service: 03/11/2017 2:00 PM Medical Record Number: [id number] Patient Account Number: [id number] Date of Birth/Sex: 13-Jun-1935 (82 y.o. Female) Treating RN: [name], [employer] Primary Care Kahari Critzer: [name] Other Clinician: Referring Josemanuel Eakins: [name] Treating Elizah Lydon/Extender: [name], [name]: 1 Active Inactive Electronic Signature(s) Signed: 03/11/2017 4:15:16 PM By: [name] BSN, RN Entered By: [name] on 03/11/2017 14:17:05 Kerkman, [name] ([id number]) -------------------------------------------------------------------------------- Pain Assessment Details Patient Name: Weingarten, [name] Date of Service: 03/11/2017 2:00 PM Medical Record Number: [id number] Patient Account Number: [id number] Date of Birth/Sex: 17-Aug-1935 (82 y.o. Female) Treating RN: [name], [name] Primary Care Kazzandra Desaulniers: [name] Other Clinician: Referring Tonio Seider: [name] Treating Axten Pascucci/Extender: [name], [name]: 1 Active Problems Location of Pain Severity and Description of Pain Patient Has Paino No Site Locations With Dressing Change: No Pain Management and Medication Current Pain Management: Electronic Signature(s) Signed: 03/11/2017 4:15:16 PM By: [name] BSN, RN Entered By: [name] on 03/11/2017 14:11:00 [name], [name] ([id number]) -------------------------------------------------------------------------------- Patient/Caregiver Education Details Patient Name: Corzine, [name] Date of Service: 03/11/2017 2:00 PM Medical Record Number: [id number] Patient Account Number: [id number] Date of Birth/Gender: 1934-09-14 (82 y.o. Female) Treating RN: Baruch Gouty, RN, [name], [name] Primary Care Physician: [name] Other Clinician: Referring Physician: Dena [name] Treating Physician/Extender: Sharalyn Ink in [name]: 1 Education Assessment Education Provided To: Patient Education Topics Provided Wound/Skin Impairment: Methods:  Explain/Verbal Responses: State content correctly Electronic Signature(s) Signed: 03/11/2017 4:15:16 PM By: [name] BSN, RN Entered By: [name] on 03/11/2017 14:21:40 Licea, [name] ([id number]) -------------------------------------------------------------------------------- Wound Assessment Details Patient Name: Novakovich, [name] Date of Service: 03/11/2017 2:00 PM Medical Record Number: [id number] Patient Account Number: [id number] Date of Birth/Sex: 1934-12-11 (82 y.o. Female) Treating RN: Baruch Gouty, RN, [name], [name] Primary Care Tyquavious Gamel: [name] Other Clinician: Referring Olaf Mesa: [name] Treating Arpan Eskelson/Extender: [name], [name]: 1 Wound Status Wound Number: 2 Primary 1st degree Burn Etiology: Wound Location: Left Wrist Wound Healed - Epithelialized Wounding Event: Thermal Burn Status: Date Acquired: 01/11/2017 Comorbid Cataracts, Anemia, Chronic Obstructive Weeks Of [name]: 1 History: Pulmonary Disease (COPD), Coronary Clustered Wound: No Artery Disease, Hypertension, Osteoarthritis, Received Chemotherapy Photos Photo Uploaded By: [name] on 03/11/2017 16:22:27 Wound Measurements Length: (cm) 0 % Reduction in Width: (cm) 0 % Reduction in Depth: (cm) 0 Epithelializat Area: (cm) 0 Tunneling: Volume: (cm) 0 Undermining: Area: 100% Volume: 100% ion:  Large (67-100%) No No Wound Description Classification: Partial Thickness Foul Odor Aft Wound Margin: Flat and Intact Slough/Fibrin Exudate Amount: None Present er Cleansing: No o No Wound Bed Granulation Amount: None Present (0%) Exposed Structure Necrotic Amount: Small (1-33%) Fascia Exposed: No Fat Layer (Subcutaneous Tissue) Exposed: No Tendon Exposed: No Muscle Exposed: No Linney, [name] ([id number]) Joint Exposed: No Bone Exposed: No Periwound Skin Texture Texture Color No Abnormalities Noted: No No Abnormalities Noted: No Callus: No Atrophie Blanche: No Crepitus:  No Cyanosis: No Excoriation: No Ecchymosis: No Induration: No Erythema: No Rash: No Hemosiderin Staining: No Scarring: No Mottled: No Pallor: No Moisture Rubor: No No Abnormalities Noted: No Dry / Scaly: No Temperature / Pain Maceration: No Temperature: No Abnormality Wound Preparation Ulcer Cleansing: Rinsed/Irrigated with Saline Topical Anesthetic Applied: None Electronic Signature(s) Signed: 03/11/2017 4:15:16 PM By: [name] BSN, RN Entered By: [name] on 03/11/2017 14:16:39 Doeden, [name] ([id number]) -------------------------------------------------------------------------------- Vitals Details Patient Name: Chilcote, [name] Date of Service: 03/11/2017 2:00 PM Medical Record Number: [id number] Patient Account Number: [id number] Date of Birth/Sex: 08-22-1935 (82 y.o. Female) Treating RN: [name], [place] Primary Care Novaleigh Kohlman: [name] Other Clinician: Referring Prakash Kimberling: [name] Treating Tate Jerkins/Extender: [name], [name]: 1 Vital Signs Time Taken: 14:11 Temperature (F): 98.2 Height (in): 67 Pulse (bpm): 46 Weight (lbs): 222 Respiratory Rate (breaths/min): 16 Body Mass Index (BMI): 34.8 Blood Pressure (mmHg): 128/52 Reference Range: 80 - 120 mg / dl Electronic Signature(s) Signed: 03/11/2017 4:15:16 PM By: [name] BSN, RN Entered By: [name] on 03/11/2017 14:11:37
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
## 2017-03-14 ENCOUNTER — Telehealth (HOSPITAL_COMMUNITY): Payer: Self-pay

## 2017-03-14 NOTE — Telephone Encounter (Signed)
Patient calling to report unable to obtain Torsemide at [facility] in [place] on [address] as she states it was denied by insurance. Will forward to CH[facility] clinical pharmacist Ileene Patrick to see if we can assist in getting medication approved.  Renee Pain, RN

## 2017-03-14 NOTE — Telephone Encounter (Signed)
Torsemide 80 mg daily PA approved by [facility] Medicare Part D through 03/14/18.   [name]. Velva Harman, PharmD, BCPS, CPP Clinical Pharmacist Pager: [phone] Phone: [phone] 03/14/2017 1:59 PM

## 2017-03-14 NOTE — Telephone Encounter (Signed)
PA completed today. Awaiting response from insurance provider.   [name]. Velva Harman, PharmD, BCPS, CPP Clinical Pharmacist Pager: [phone] Phone: [phone] 03/14/2017 1:30 PM

## 2017-03-15 DIAGNOSIS — I481 Persistent atrial fibrillation: Secondary | ICD-10-CM | POA: Diagnosis not present

## 2017-03-15 DIAGNOSIS — Z7901 Long term (current) use of anticoagulants: Secondary | ICD-10-CM | POA: Diagnosis not present

## 2017-03-15 DIAGNOSIS — K625 Hemorrhage of anus and rectum: Secondary | ICD-10-CM | POA: Diagnosis not present

## 2017-03-15 DIAGNOSIS — Z85038 Personal history of other malignant neoplasm of large intestine: Secondary | ICD-10-CM | POA: Diagnosis not present

## 2017-03-16 ENCOUNTER — Other Ambulatory Visit: Payer: Medicare Other

## 2017-03-16 ENCOUNTER — Telehealth (HOSPITAL_COMMUNITY): Payer: Self-pay | Admitting: *Deleted

## 2017-03-16 ENCOUNTER — Other Ambulatory Visit (HOSPITAL_COMMUNITY): Payer: Self-pay | Admitting: *Deleted

## 2017-03-16 ENCOUNTER — Ambulatory Visit: Payer: Medicare Other

## 2017-03-16 MED ORDER — TORSEMIDE 20 MG PO TABS: 80.0000 mg | ORAL_TABLET | Freq: Every day | ORAL | 6 refills | Status: DC

## 2017-03-16 NOTE — Telephone Encounter (Signed)
Pt called concerned about increase swelling.  She states she has not been able to pick up the Torsemide we prescribed on Fri b/c insurance wouldn't pay for it, so she has just been taking her Lasix and swelling has gotten worse.  She states she feels it is from her Amlodipine.  Advised per chart PA was completed and approved for her Torsemide so she can pick it up today.  Also discussed w/Andy Chalmers Cater, PA as amlodipine is not a new medication continue it for now, start Torsemide 80 mg daily TODAY call back in a day or 2 if not getting better.  Pt is aware and agreeable to all

## 2017-03-18 ENCOUNTER — Other Ambulatory Visit (HOSPITAL_COMMUNITY): Payer: Medicare Other

## 2017-03-23 ENCOUNTER — Telehealth (HOSPITAL_COMMUNITY): Payer: Self-pay | Admitting: *Deleted

## 2017-03-23 NOTE — Telephone Encounter (Signed)
Patient left message on triage line late yesterday afternoon complaining of severe stomach pain.    I called patient back this morning and she explained that she has been very constipated and unable to move her bowels  This is the cause her stomach pains.  I advised her to go to an urgent care or call her PCP to be seen.  She said she was instructed by her PCP yesterday to go to the ER.  I also advised her to do the same at this point as it is not heart related. She understands and no further questions.

## 2017-03-24 ENCOUNTER — Emergency Department (HOSPITAL_COMMUNITY): Payer: Medicare Other

## 2017-03-24 ENCOUNTER — Encounter (HOSPITAL_COMMUNITY): Payer: Self-pay | Admitting: Nurse Practitioner

## 2017-03-24 ENCOUNTER — Emergency Department (HOSPITAL_COMMUNITY)
Admission: EM | Admit: 2017-03-24 | Discharge: 2017-03-24 | Disposition: A | Discharge status: Home / Self Care | Payer: Medicare Other | Attending: Emergency Medicine | Admitting: Emergency Medicine

## 2017-03-24 DIAGNOSIS — Z79899 Other long term (current) drug therapy: Secondary | ICD-10-CM | POA: Insufficient documentation

## 2017-03-24 DIAGNOSIS — Z7901 Long term (current) use of anticoagulants: Secondary | ICD-10-CM | POA: Diagnosis not present

## 2017-03-24 DIAGNOSIS — Z853 Personal history of malignant neoplasm of breast: Secondary | ICD-10-CM | POA: Diagnosis not present

## 2017-03-24 DIAGNOSIS — K5730 Diverticulosis of large intestine without perforation or abscess without bleeding: Secondary | ICD-10-CM | POA: Diagnosis not present

## 2017-03-24 DIAGNOSIS — R1031 Right lower quadrant pain: Secondary | ICD-10-CM | POA: Diagnosis not present

## 2017-03-24 DIAGNOSIS — R1032 Left lower quadrant pain: Secondary | ICD-10-CM | POA: Diagnosis not present

## 2017-03-24 DIAGNOSIS — I10 Essential (primary) hypertension: Secondary | ICD-10-CM | POA: Insufficient documentation

## 2017-03-24 DIAGNOSIS — K5732 Diverticulitis of large intestine without perforation or abscess without bleeding: Secondary | ICD-10-CM | POA: Diagnosis not present

## 2017-03-24 DIAGNOSIS — R1030 Lower abdominal pain, unspecified: Secondary | ICD-10-CM | POA: Diagnosis present

## 2017-03-24 DIAGNOSIS — I2510 Atherosclerotic heart disease of native coronary artery without angina pectoris: Secondary | ICD-10-CM | POA: Diagnosis not present

## 2017-03-24 DIAGNOSIS — K5792 Diverticulitis of intestine, part unspecified, without perforation or abscess without bleeding: Secondary | ICD-10-CM

## 2017-03-24 DIAGNOSIS — Z85038 Personal history of other malignant neoplasm of large intestine: Secondary | ICD-10-CM | POA: Insufficient documentation

## 2017-03-24 DIAGNOSIS — J449 Chronic obstructive pulmonary disease, unspecified: Secondary | ICD-10-CM | POA: Diagnosis not present

## 2017-03-24 LAB — COMPREHENSIVE METABOLIC PANEL
ALT: 16 U/L (ref 14–54)
ANION GAP: 11 (ref 5–15)
AST: 21 U/L (ref 15–41)
Albumin: 3.7 g/dL (ref 3.5–5.0)
Alkaline Phosphatase: 80 U/L (ref 38–126)
BUN: 16 mg/dL (ref 6–20)
CHLORIDE: 100 mmol/L — AB (ref 101–111)
CO2: 27 mmol/L (ref 22–32)
CREATININE: 1.21 mg/dL — AB (ref 0.44–1.00)
Calcium: 9.5 mg/dL (ref 8.9–10.3)
GFR, EST AFRICAN AMERICAN: 48 mL/min — AB (ref >60)
GFR, EST NON AFRICAN AMERICAN: 41 mL/min — AB (ref >60)
Glucose, Bld: 133 mg/dL — ABNORMAL HIGH (ref 65–99)
Potassium: 4 mmol/L (ref 3.5–5.1)
Sodium: 138 mmol/L (ref 135–145)
Total Bilirubin: 0.4 mg/dL (ref 0.3–1.2)
Total Protein: 8 g/dL (ref 6.5–8.1)

## 2017-03-24 LAB — URINALYSIS, ROUTINE W REFLEX MICROSCOPIC
Bilirubin Urine: NEGATIVE
GLUCOSE, UA: NEGATIVE mg/dL
Hgb urine dipstick: NEGATIVE
Ketones, ur: NEGATIVE mg/dL
Leukocytes, UA: NEGATIVE
Nitrite: NEGATIVE
PROTEIN: NEGATIVE mg/dL
Specific Gravity, Urine: 1.004 — ABNORMAL LOW (ref 1.005–1.030)
pH: 5 (ref 5.0–8.0)

## 2017-03-24 LAB — CBC
HCT: 37 % (ref 36.0–46.0)
HEMOGLOBIN: 11.7 g/dL — AB (ref 12.0–15.0)
MCH: 29.2 pg (ref 26.0–34.0)
MCHC: 31.6 g/dL (ref 30.0–36.0)
MCV: 92.3 fL (ref 78.0–100.0)
PLATELETS: 292 10*3/uL (ref 150–400)
RBC: 4.01 MIL/uL (ref 3.87–5.11)
RDW: 14 % (ref 11.5–15.5)
WBC: 7.6 10*3/uL (ref 4.0–10.5)

## 2017-03-24 LAB — POC OCCULT BLOOD, ED: FECAL OCCULT BLD: NEGATIVE

## 2017-03-24 MED ORDER — HYDROCODONE-ACETAMINOPHEN 5-325 MG PO TABS
1.0000 | ORAL_TABLET | Freq: Once | ORAL | Status: AC
  Administered 2017-03-24: 1 via ORAL
  Filled 2017-03-24: qty 1

## 2017-03-24 MED ORDER — AMOXICILLIN-POT CLAVULANATE 875-125 MG PO TABS: 1.0000 | ORAL_TABLET | Freq: Two times a day (BID) | ORAL | 0 refills | Status: DC

## 2017-03-24 MED ORDER — HYDROCODONE-ACETAMINOPHEN 5-325 MG PO TABS: 1.0000 | ORAL_TABLET | Freq: Four times a day (QID) | ORAL | 0 refills | Status: DC | PRN

## 2017-03-24 MED ORDER — BARIUM SULFATE 2.1 % PO SUSP
ORAL | Status: AC
  Filled 2017-03-24: qty 2

## 2017-03-24 MED ORDER — AMOXICILLIN-POT CLAVULANATE 875-125 MG PO TABS
1.0000 | ORAL_TABLET | Freq: Once | ORAL | Status: AC
  Administered 2017-03-24: 1 via ORAL
  Filled 2017-03-24: qty 1

## 2017-03-24 NOTE — ED Triage Notes (Signed)
Pt presents with c/o lower abdominal pain. The pain began last week. She denies any fevers, nausea, vomiting. She reports dysuria, hard stool with some blood noted afterwards. She tried tylenol without any improvement.

## 2017-03-24 NOTE — ED Notes (Signed)
Patient transported to CT 

## 2017-03-24 NOTE — Discharge Instructions (Addendum)
It was our pleasure to provide your ER care today - we hope that you feel better.  Rest. Drink adequate fluids.  For recent rectal bleeding, follow up with your doctor/Gi doctor in the next 1-2 weeks - discuss possible endoscopy.   Return to ER if worse, new symptoms, severe abdominal pain, persistent vomiting, other concern.

## 2017-03-24 NOTE — ED Provider Notes (Signed)
[facility] DEPT Provider Note   CSN: [id number] Arrival date & time: 03/24/17  1220     History   Chief Complaint Chief Complaint  Patient presents with  . Abdominal Pain    HPI [name] is a 82 y.o. female.  Patient with hx colon ca, hx diverticula, c/o intermittent bil lower abd pain in the past couple days.  States had noted streaks of blood in stool earlier in week, but states had 3 bms today, with no blood. No abd distension. No vomiting. Normal appetite. No other abnormal bleeding or bruising. Denies dysuria or gu c/o. No fever or chills. No cp or sob.    The history is provided by the patient.  Abdominal Pain   Pertinent negatives include fever, vomiting, dysuria and headaches.    Past Medical History:  Diagnosis Date  . Allergy    rhinitis  . Arthritis   . Breast cancer ([place]) 01/06/2012  . Cancer [facility])    right colon and left breast  . Chronic diastolic CHF (congestive heart failure) ([place])   . Colon cancer ([place]) 01/06/2012  . COPD (chronic obstructive pulmonary disease) ([place])   . Coronary artery disease 2006   nonobstructive with 20-30% ostial LAD and LM  . Dilated aortic root (H[facility])    19mmHg  . Diverticulosis   . Edema extremities   . GERD (gastroesophageal reflux disease)   . Hernia   . Hiatal hernia    denies  . Hyperlipidemia   . Hypertension   . Mild aortic stenosis    echo 11/2015 but not noted on echo 06/2016  . Osteopenia   . Permanent atrial fibrillation (H[facility])    chronic atrial fibrillation  . Pneumonia    hx child  . Pulmonary HTN ([place])    moderate to severe PASP 63mmHg echo 11/2015 - now 79mmHg by echo 06/2016  . Takotsubo syndrome 07/29/2015   a. EF 35-40% by echo; akinesis of mid-apical anteroseptal and apical myocardium.  EF now normalized on echo 11/2015    Patient Active Problem List   Diagnosis Date Noted  . Dyslipidemia, goal LDL below 70 11/28/2016  . Monitoring for long-term anticoagulant use 12/31/2015  . Chronic diastolic  CHF (congestive heart failure) ([place]) 10/28/2015  . Insomnia 10/23/2015  . Leg swelling 08/11/2015  . Takotsubo syndrome 07/29/2015  . NSTEMI (non-ST elevated myocardial infarction) ([place]) 07/28/2015  . SOB (shortness of breath) 08/15/2014  . Dilated aortic root ([place])   . Pulmonary HTN ([place])   . Permanent atrial fibrillation ([place]) 07/04/2013  . Chronic anticoagulation 07/04/2013  . Coronary artery disease   . Lesion of nipple 05/15/2013  . GERD (gastroesophageal reflux disease)   . Hypertension   . COPD (chronic obstructive pulmonary disease) ([place])   . Osteopenia   . Diverticulosis   . Hiatal hernia   . Abdominal pain 01/11/2012  . Breast cancer of upper-outer quadrant of left female breast ([place]) 01/06/2012  . Colon cancer ([place]) 01/06/2012    Past Surgical History:  Procedure Laterality Date  . APPENDECTOMY    . BREAST SURGERY     lumpectomy left  . CARDIAC CATHETERIZATION    . CARDIAC CATHETERIZATION N/A 07/28/2015   Procedure: Left Heart Cath and Coronary Angiography;  Surgeon: Peter M Martinique, MD;  Location: [facility];  Service: Cardiovascular;  Laterality: N/A;  . CHOLECYSTECTOMY    . COLECTOMY     right side  . EXCISION OF ACCESSORY NIPPLE Bilateral 05/30/2013   Procedure: BILATERAL NIPPLE  BIOPSY;  Surgeon: Merrie Roof, MD;  Location: [facility];  Service: General;  Laterality: Bilateral;  . EYE SURGERY Bilateral 14   cataracts  . MASTECTOMY PARTIAL / LUMPECTOMY  2008   left breast  . SPLIT NIGHT STUDY  02/02/2016        OB History    No data available       Home Medications    Prior to Admission medications   Medication Sig Start Date End Date Taking? Authorizing Provider  acetaminophen (TYLENOL) 325 MG tablet Take 650 mg by mouth every 6 (six) hours as needed for mild pain. Reported on 03/15/2016    [provider]  amLODipine (NORVASC) 10 MG tablet TAKE ONE TABLET BY MOUTH ONCE DAILY 12/13/16   Orlena Sheldon, PA-C  cetirizine (ZYRTEC) 10 MG  tablet Take 1 tablet (10 mg total) by mouth daily. 04/28/16   Dena Billet B, PA-C  cloNIDine (CATAPRES) 0.1 MG tablet TAKE ONE TABLET BY MOUTH TWICE DAILY 12/14/16   [name], MD  metoprolol succinate (TOPROL-XL) 50 MG 24 hr tablet TAKE ONE TABLET BY MOUTH ONCE DAILY TAKE  WITH  OR  FOLLOWING  A  MEAL 10/26/16   Dena Billet B, PA-C  montelukast (SINGULAIR) 10 MG tablet Take 1 tablet (10 mg total) by mouth at bedtime. 04/28/16   Orlena Sheldon, PA-C  nitroGLYCERIN (NITROSTAT) 0.4 MG SL tablet Place 1 tablet (0.4 mg total) under the tongue every 5 (five) minutes as needed for chest pain (x 3 pills). 10/23/15   Orlena Sheldon, PA-C  omeprazole (PRILOSEC) 20 MG capsule Take 1 capsule (20 mg total) by mouth daily as needed (heartburn, acid reflux). 09/25/15   Dena Billet B, PA-C  ondansetron (ZOFRAN ODT) 4 MG disintegrating tablet Take 1 tablet (4 mg total) by mouth every 8 (eight) hours as needed for nausea or vomiting. Patient not taking: Reported on 03/11/2017 02/26/17   Sherwood Gambler, MD  OVER THE COUNTER MEDICATION Place 1 drop into both eyes daily as needed (dry eyes). Eye drops    [provider]  spironolactone (ALDACTONE) 25 MG tablet TAKE ONE-HALF TABLET BY MOUTH ONCE DAILY 12/28/16   [name], MD  torsemide (DEMADEX) 20 MG tablet Take 4 tablets (80 mg total) by mouth daily. 03/16/17   Larey Dresser, MD  triamcinolone cream (KENALOG) 0.1 % Apply 1 application topically 2 (two) times daily. 12/31/16   [place], Modena Nunnery, MD  valACYclovir (VALTREX) 1000 MG tablet Take 1 tablet (1,000 mg total) by mouth 3 (three) times daily. 12/31/16   Alycia Rossetti, MD  valsartan (DIOVAN) 320 MG tablet Take 1 tablet (320 mg total) by mouth daily. 11/18/16   Bensimhon, Shaune Pascal, MD  warfarin (COUMADIN) 1 MG tablet Take 0.5 tablets (0.5 mg total) by mouth as directed. M-W-F-SA 07/08/16   Orlena Sheldon, PA-C  warfarin (COUMADIN) 5 MG tablet Take 0.5 tablets (2.5 mg total) by mouth See admin  instructions. Take half a tablet (2.5 mg) on Tuesdays, Thursdays, and Sundays. Take half a tablet with half a tablet of 1 mg (total 3 mg) on all other days. 07/08/16   Orlena Sheldon, PA-C    Family History Family History  Problem Relation Age of Onset  . Heart disease Mother   . Heart attack Mother   . Cancer Sister        stomach and colon  . Heart disease Brother 29  . Hypertension Father   .  Cancer Sister   . Stroke Neg Hx     Social History Social History  Substance Use Topics  . Smoking status: Never Smoker  . Smokeless tobacco: Never Used  . Alcohol use No     Allergies   Contrast media [iodinated diagnostic agents]; Clonidine derivatives; Statins; Sulfa antibiotics; Celebrex [celecoxib]; Isosorbide nitrate; Other; and Tape   Review of Systems Review of Systems  Constitutional: Negative for fever.  HENT: Negative for sore throat.   Eyes: Negative for redness.  Respiratory: Negative for cough and shortness of breath.   Cardiovascular: Negative for chest pain.  Gastrointestinal: Positive for abdominal pain. Negative for vomiting.  Endocrine: Negative for polyuria.  Genitourinary: Negative for dysuria and flank pain.  Musculoskeletal: Negative for back pain.  Skin: Negative for rash.  Neurological: Negative for light-headedness and headaches.  Hematological: Does not bruise/bleed easily.  Psychiatric/Behavioral: Negative for confusion.     Physical Exam Updated Vital Signs BP 138/71 (BP Location: Right Arm)   Pulse 76   Temp 98 F (36.7 C) (Oral)   Resp 18   SpO2 99%   Physical Exam  Constitutional: She appears well-developed and well-nourished. No distress.  HENT:  Mouth/Throat: Oropharynx is clear and moist.  Eyes: Conjunctivae are normal. No scleral icterus.  Neck: Neck supple. No tracheal deviation present.  Cardiovascular: Normal rate, normal heart sounds and intact distal pulses.  Exam reveals no gallop and no friction rub.   No murmur  heard. Pulmonary/Chest: Effort normal and breath sounds normal. No respiratory distress.  Abdominal: Soft. Normal appearance and bowel sounds are normal. She exhibits no distension and no mass. There is tenderness. There is no rebound and no guarding. No hernia.  No pulsatile mass. bil abd tenderness.   Genitourinary:  Genitourinary Comments: No cva tenderness. Stool light brown. No rectal mass felt. No hemorrhoid or fissure seen.  Musculoskeletal: She exhibits no edema.  Neurological: She is alert.  Skin: Skin is warm and dry. No rash noted. She is not diaphoretic.  Psychiatric: She has a normal mood and affect.  Nursing note and vitals reviewed.    ED Treatments / Results  Labs (all labs ordered are listed, but only abnormal results are displayed) Results for orders placed or performed during the hospital encounter of 03/24/17  Comprehensive metabolic panel  Result Value Ref Range   Sodium 138 135 - 145 mmol/L   Potassium 4.0 3.5 - 5.1 mmol/L   Chloride 100 (L) 101 - 111 mmol/L   CO2 27 22 - 32 mmol/L   Glucose, Bld 133 (H) 65 - 99 mg/dL   BUN 16 6 - 20 mg/dL   Creatinine, Ser 1.21 (H) 0.44 - 1.00 mg/dL   Calcium 9.5 8.9 - 10.3 mg/dL   Total Protein 8.0 6.5 - 8.1 g/dL   Albumin 3.7 3.5 - 5.0 g/dL   AST 21 15 - 41 U/L   ALT 16 14 - 54 U/L   Alkaline Phosphatase 80 38 - 126 U/L   Total Bilirubin 0.4 0.3 - 1.2 mg/dL   GFR calc non Af Amer 41 (L) >60 mL/min   GFR calc Af Amer 48 (L) >60 mL/min   Anion gap 11 5 - 15  CBC  Result Value Ref Range   WBC 7.6 4.0 - 10.5 K/uL   RBC 4.01 3.87 - 5.11 MIL/uL   Hemoglobin 11.7 (L) 12.0 - 15.0 g/dL   HCT 37.0 36.0 - 46.0 %   MCV 92.3 78.0 - 100.0 fL   MCH  29.2 26.0 - 34.0 pg   MCHC 31.6 30.0 - 36.0 g/dL   RDW 14.0 11.5 - 15.5 %   Platelets 292 150 - 400 K/uL  Urinalysis, Routine w reflex microscopic  Result Value Ref Range   Color, Urine STRAW (A) YELLOW   APPearance CLEAR CLEAR   Specific Gravity, Urine 1.004 (L) 1.005 - 1.030    pH 5.0 5.0 - 8.0   Glucose, UA NEGATIVE NEGATIVE mg/dL   Hgb urine dipstick NEGATIVE NEGATIVE   Bilirubin Urine NEGATIVE NEGATIVE   Ketones, ur NEGATIVE NEGATIVE mg/dL   Protein, ur NEGATIVE NEGATIVE mg/dL   Nitrite NEGATIVE NEGATIVE   Leukocytes, UA NEGATIVE NEGATIVE  POC occult blood, ED Provider will collect  Result Value Ref Range   Fecal Occult Bld NEGATIVE NEGATIVE     EKG  EKG Interpretation None       Radiology No results found.  Procedures Procedures (including critical care time)  Medications Ordered in ED Medications - No data to display   Initial Impression / Assessment and Plan / ED Course  I have reviewed the triage vital signs and the nursing notes.  Pertinent labs & imaging results that were available during my care of the patient were reviewed by me and considered in my medical decision making (see chart for details).  Iv ns. Labs.  Reviewed nursing notes and prior charts for additional history.   Persistent abd tenderness and pain - will get imaging study.   1646 ct pending - signed out to Dr Regenia Skeeter to check CT when resulted, and dispo appropriately then.   Final Clinical Impressions(s) / ED Diagnoses   Final diagnoses:  None    New Prescriptions New Prescriptions   No medications on file     Lajean Saver, MD 03/24/17 1646

## 2017-03-24 NOTE — ED Provider Notes (Signed)
Patient CT scan shows uncomplicated diverticulitis. Labs are unremarkable including a normal white blood cell count. No current rectal bleeding. She has tenderness in her lower abdomen but pain is relatively controlled. No vomiting or fevers. I think it is reasonable to discharge home with strict return precautions which we have discussed, including fevers, vomiting, or worsening pain. She is already following up with her gastroenterologist in 6 days, discussed the possible abnormal findings at her GE junction.   Sherwood Gambler, MD 03/24/17 [phone]

## 2017-03-25 ENCOUNTER — Ambulatory Visit (HOSPITAL_COMMUNITY)
Admission: RE | Admit: 2017-03-25 | Discharge: 2017-03-25 | Disposition: A | Discharge status: Home / Self Care | Payer: Medicare Other | Source: Ambulatory Visit | Attending: Cardiology | Admitting: Cardiology

## 2017-03-25 ENCOUNTER — Encounter (HOSPITAL_COMMUNITY): Payer: Self-pay

## 2017-03-25 VITALS — BP 130/62 | HR 64 | Wt 217.2 lb

## 2017-03-25 DIAGNOSIS — I2510 Atherosclerotic heart disease of native coronary artery without angina pectoris: Secondary | ICD-10-CM | POA: Diagnosis not present

## 2017-03-25 DIAGNOSIS — I10 Essential (primary) hypertension: Secondary | ICD-10-CM | POA: Diagnosis not present

## 2017-03-25 DIAGNOSIS — Z823 Family history of stroke: Secondary | ICD-10-CM | POA: Diagnosis not present

## 2017-03-25 DIAGNOSIS — Z79899 Other long term (current) drug therapy: Secondary | ICD-10-CM | POA: Diagnosis not present

## 2017-03-25 DIAGNOSIS — Z8249 Family history of ischemic heart disease and other diseases of the circulatory system: Secondary | ICD-10-CM | POA: Insufficient documentation

## 2017-03-25 DIAGNOSIS — I4821 Permanent atrial fibrillation: Secondary | ICD-10-CM

## 2017-03-25 DIAGNOSIS — E784 Other hyperlipidemia: Secondary | ICD-10-CM

## 2017-03-25 DIAGNOSIS — I110 Hypertensive heart disease with heart failure: Secondary | ICD-10-CM | POA: Diagnosis present

## 2017-03-25 DIAGNOSIS — I272 Pulmonary hypertension, unspecified: Secondary | ICD-10-CM | POA: Diagnosis not present

## 2017-03-25 DIAGNOSIS — I712 Thoracic aortic aneurysm, without rupture: Secondary | ICD-10-CM | POA: Insufficient documentation

## 2017-03-25 DIAGNOSIS — I5032 Chronic diastolic (congestive) heart failure: Secondary | ICD-10-CM | POA: Insufficient documentation

## 2017-03-25 DIAGNOSIS — Z800 Family history of malignant neoplasm of digestive organs: Secondary | ICD-10-CM | POA: Diagnosis not present

## 2017-03-25 DIAGNOSIS — K219 Gastro-esophageal reflux disease without esophagitis: Secondary | ICD-10-CM | POA: Insufficient documentation

## 2017-03-25 DIAGNOSIS — I482 Chronic atrial fibrillation: Secondary | ICD-10-CM

## 2017-03-25 DIAGNOSIS — Z7901 Long term (current) use of anticoagulants: Secondary | ICD-10-CM | POA: Diagnosis not present

## 2017-03-25 DIAGNOSIS — E785 Hyperlipidemia, unspecified: Secondary | ICD-10-CM | POA: Diagnosis not present

## 2017-03-25 DIAGNOSIS — E7849 Other hyperlipidemia: Secondary | ICD-10-CM

## 2017-03-25 DIAGNOSIS — K921 Melena: Secondary | ICD-10-CM | POA: Diagnosis not present

## 2017-03-25 DIAGNOSIS — Z853 Personal history of malignant neoplasm of breast: Secondary | ICD-10-CM | POA: Insufficient documentation

## 2017-03-25 MED ORDER — LOSARTAN POTASSIUM 100 MG PO TABS: 100.0000 mg | ORAL_TABLET | Freq: Every day | ORAL | 3 refills | Status: DC

## 2017-03-25 MED ORDER — AMLODIPINE BESYLATE 5 MG PO TABS: 5.0000 mg | ORAL_TABLET | Freq: Every day | ORAL | 3 refills | Status: DC

## 2017-03-25 NOTE — Patient Instructions (Signed)
DECREASE Amlodipine to 5mg  daily  STOP Valsartan  START Losartan 100mg  daily  Follow up with Dr.McLean in 3 months

## 2017-03-25 NOTE — Progress Notes (Signed)
Patient ID: [name], female   DOB: [birth year], 82 y.o.   MRN: [id number]    Advanced Heart Failure Clinic Note   PCP: Karis Juba Cardiology: [name] HF Cardiology: Aundra Dubin  82 yo with history of poorly controlled HTN, chronic atrial fibrillation, chronic diastolic CHF, and prior episode of Takotsubo cardiomyopathy presents for CHF clinic appointment. Patient has had exertional dyspnea since 11/16.  At that time, she presented to the hospital with chest pain.  Coronary angiography showed no significant CAD.  EF was 35-40% by echo, suspected Takotsubo cardiomyopathy.  Her last echo in 3/17 showed EF improved to 22-02% but PA systolic pressure elevated at 65 mmHg.  She has had ongoing problems with exertional dyspnea and lower extremity edema.  At a prior appointment, we adjusted her BP meds and increased Lasix to 80 qam/40 qpm for volume overload.   Today she returns for HF follow up. Last visit lasix was stopped and torsemide was started. Says her weight has gone down to 212 pounds at home. Denies SOB/PND/Orthopnea. Appetite ok. No bleeding problems. No fever or chills. Taking all medications. She is requesting to cut back or stop amlodipine due to leg swelling.   Labs (11/16): LDL 80 Labs (4/17): K 4.5, creatinine 1.1, BNP 132 Labs (5/17): LDL 92 Labs (3/18): K 4, creatinine 0.98, LDL 81, proBNP 834 Labs (6/18): K 4.1, creatinine 1.37, hgb 12.5 Labs (03/24/2017): K 4, Creatinine 1.2 Hgb 11.7   PMH: 1. Hyperlipidemia: Myalgias with atorvastatin, Crestor, simvastatin. Myalgias with Zetia.  2. Chronic atrial fibrillation 3. HTN: Poor control.  4. H/o breast cancer: 2013.  5. GERD 6. Takotsubo cardiomyopathy: Admitted in 11/16 with chest pain.  Coronary angiography showed nonobstructive CAD.  Echo (11/16) showed EF 35-40%.   - Echo (3/17) with EF 60-65%, mild aortic stenosis, PA systolic pressure 65 mmHg, RV mildly dilated with normal systolic function.  - Cardiolite (4/17) with EF 61%,  small reversible mid anteroseptal/apical lateral defects thought to be related to shifting breast artifact => low risk study.  7. Chronic diastolic CHF 8. Ascending aortic aneurysm: 4.3 cm by CTA in 11/16.  9. Pulmonary hypertension: PASP 65 mmHg by echo in 3/17.  CTA chest in 11/16 with no PE.  PFTs in 7/15 with mild obstructive lung disease.  10. Sleep study negative 2017.  Social History   Social History  . Marital status: Married    Spouse name: N/A  . Number of children: N/A  . Years of education: N/A   Occupational History  . Not on file.   Social History Main Topics  . Smoking status: Never Smoker  . Smokeless tobacco: Never Used  . Alcohol use No  . Drug use: No  . Sexual activity: Not Currently   Other Topics Concern  . Not on file   Social History Narrative  . No narrative on file   Family History  Problem Relation Age of Onset  . Heart disease Mother   . Heart attack Mother   . Cancer Sister        stomach and colon  . Heart disease Brother 97  . Hypertension Father   . Cancer Sister   . Stroke Neg Hx    ROS: All systems reviewed and negative except as per HPI.   Current Outpatient Prescriptions  Medication Sig Dispense Refill  . acetaminophen (TYLENOL) 325 MG tablet Take 650 mg by mouth every 6 (six) hours as needed for mild pain. Reported on 03/15/2016    .  amLODipine (NORVASC) 10 MG tablet TAKE ONE TABLET BY MOUTH ONCE DAILY 90 tablet 0  . amoxicillin-clavulanate (AUGMENTIN) 875-125 MG tablet Take 1 tablet by mouth 2 (two) times daily. One po bid x 7 days 14 tablet 0  . cetirizine (ZYRTEC) 10 MG tablet Take 1 tablet (10 mg total) by mouth daily. 30 tablet 11  . cloNIDine (CATAPRES) 0.1 MG tablet TAKE ONE TABLET BY MOUTH TWICE DAILY 60 tablet 11  . guaiFENesin (MUCINEX) 600 MG 12 hr tablet Take 600 mg by mouth daily as needed for cough or to loosen phlegm.    Marland Kitchen HYDROcodone-acetaminophen (NORCO) 5-325 MG tablet Take 1 tablet by mouth every 6 (six) hours  as needed for severe pain. 10 tablet 0  . metoprolol succinate (TOPROL-XL) 50 MG 24 hr tablet TAKE ONE TABLET BY MOUTH ONCE DAILY TAKE  WITH  OR  FOLLOWING  A  MEAL 90 tablet 3  . omeprazole (PRILOSEC) 20 MG capsule Take 1 capsule (20 mg total) by mouth daily as needed (heartburn, acid reflux). 90 capsule 3  . ondansetron (ZOFRAN ODT) 4 MG disintegrating tablet Take 1 tablet (4 mg total) by mouth every 8 (eight) hours as needed for nausea or vomiting. 8 tablet 0  . OVER THE COUNTER MEDICATION Place 1 drop into both eyes daily as needed (dry eyes). Eye drops    . spironolactone (ALDACTONE) 25 MG tablet TAKE ONE-HALF TABLET BY MOUTH ONCE DAILY 45 tablet 3  . torsemide (DEMADEX) 20 MG tablet Take 4 tablets (80 mg total) by mouth daily. 120 tablet 6  . triamcinolone cream (KENALOG) 0.1 % Apply 1 application topically 2 (two) times daily. 30 g 0  . valsartan (DIOVAN) 320 MG tablet Take 1 tablet (320 mg total) by mouth daily. 90 tablet 3  . warfarin (COUMADIN) 1 MG tablet Take 0.5 tablets (0.5 mg total) by mouth as directed. M-W-F-SA (Patient taking differently: Take 3 mg by mouth See admin instructions. Mon/Wed/Fri/Sat) 90 tablet 2  . warfarin (COUMADIN) 5 MG tablet Take 0.5 tablets (2.5 mg total) by mouth See admin instructions. Take half a tablet (2.5 mg) on Tuesdays, Thursdays, and Sundays. Take half a tablet with half a tablet of 1 mg (total 3 mg) on all other days. 90 tablet 2  . montelukast (SINGULAIR) 10 MG tablet Take 1 tablet (10 mg total) by mouth at bedtime. (Patient not taking: Reported on 03/25/2017) 30 tablet 3  . nitroGLYCERIN (NITROSTAT) 0.4 MG SL tablet Place 1 tablet (0.4 mg total) under the tongue every 5 (five) minutes as needed for chest pain (x 3 pills). (Patient not taking: Reported on 03/25/2017) 30 tablet 2  . valACYclovir (VALTREX) 1000 MG tablet Take 1 tablet (1,000 mg total) by mouth 3 (three) times daily. (Patient not taking: Reported on 03/24/2017) 21 tablet 0   No current  facility-administered medications for this encounter.    BP 130/62   Pulse 64   Wt 217 lb 4 oz (98.5 kg)   SpO2 98%   BMI 34.03 kg/m    Wt Readings from Last 3 Encounters:  03/25/17 217 lb 4 oz (98.5 kg)  03/11/17 223 lb 12.8 oz (101.5 kg)  03/07/17 226 lb 12.8 oz (102.9 kg)     General:  Well appearing. No resp difficulty. Walked in the clinic  HEENT: normal Neck: supple. JVP 6-7. Carotids 2+ bilat; no bruits. No lymphadenopathy or thryomegaly appreciated. Cor: PMI nondisplaced. Regular rate & rhythm. No rubs, gallops or murmurs. Lungs: clear Abdomen: soft, nontender, nondistended.  No hepatosplenomegaly. No bruits or masses. Good bowel sounds. Extremities: no cyanosis, clubbing, rash, trace RLE and LLE edema Neuro: alert & orientedx3, cranial nerves grossly intact. moves all 4 extremities w/o difficulty. Affect pleasant  Assessment/Plan: 1. Chronic atrial fibrillation: Rate-controlled on Toprol XL.  Anticoagulated with warfarin.  No bleeding problems.  2. HTN: Controlled. Cut back amlodipine to 5 mg daily. I will also stop valsartan with FDA recall start 100 mg losartan daily tomorrow.   3. Chronic diastolic CHF: ECHO 71/0626 EF 55-60% -Volume status stable. Continue torsemide 80 mg daily I reviewed BMET from 7/19. Stable.  - Wear graded compression stockings during the day.  4. 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   5. Hematochezia: resolved has follow up with GI next week. hgb stable 7/196. Hyperlipidemia: Intolerant statin and zetia.      Follow up 3 months with Dr Precious Bard NP-C  03/25/2017

## 2017-03-29 DIAGNOSIS — R1030 Lower abdominal pain, unspecified: Secondary | ICD-10-CM | POA: Diagnosis not present

## 2017-03-29 DIAGNOSIS — Z7901 Long term (current) use of anticoagulants: Secondary | ICD-10-CM | POA: Diagnosis not present

## 2017-03-29 DIAGNOSIS — K5792 Diverticulitis of intestine, part unspecified, without perforation or abscess without bleeding: Secondary | ICD-10-CM | POA: Diagnosis not present

## 2017-03-29 DIAGNOSIS — K529 Noninfective gastroenteritis and colitis, unspecified: Secondary | ICD-10-CM | POA: Diagnosis not present

## 2017-03-29 DIAGNOSIS — R195 Other fecal abnormalities: Secondary | ICD-10-CM | POA: Diagnosis not present

## 2017-04-01 DIAGNOSIS — K5792 Diverticulitis of intestine, part unspecified, without perforation or abscess without bleeding: Secondary | ICD-10-CM | POA: Diagnosis not present

## 2017-04-04 ENCOUNTER — Ambulatory Visit (INDEPENDENT_AMBULATORY_CARE_PROVIDER_SITE_OTHER): Payer: Medicare Other | Admitting: Physician Assistant

## 2017-04-04 ENCOUNTER — Encounter: Payer: Self-pay | Admitting: Physician Assistant

## 2017-04-04 VITALS — BP 142/76 | HR 65 | Temp 98.1°F | Resp 16 | Wt 216.6 lb

## 2017-04-04 DIAGNOSIS — K5792 Diverticulitis of intestine, part unspecified, without perforation or abscess without bleeding: Secondary | ICD-10-CM

## 2017-04-04 DIAGNOSIS — F064 Anxiety disorder due to known physiological condition: Secondary | ICD-10-CM

## 2017-04-04 MED ORDER — CLONAZEPAM 0.5 MG PO TABS: 0.5000 mg | ORAL_TABLET | Freq: Two times a day (BID) | ORAL | 1 refills | Status: DC | PRN

## 2017-04-04 MED ORDER — HYDROCODONE-ACETAMINOPHEN 7.5-325 MG PO TABS: 1.0000 | ORAL_TABLET | Freq: Four times a day (QID) | ORAL | 0 refills | Status: DC | PRN

## 2017-04-04 NOTE — Progress Notes (Signed)
Patient ID: [name] MRN: [id number], DOB: [birth year], 82 y.o. Date of Encounter: @DATE @  Chief Complaint:  Chief Complaint  Patient presents with  . ER follow up    HPI: 82 y.o. year old female  presents with above.   I have reviewed her ER note dated 03/24/17. On that date she presented with complaints of intermittent bilateral lower abdominal pain over the past couple of days. Also reported that she had seen some streaks of blood in her stool earlier that week but reported that she had 3 bowel movements that day with no blood seen. She reported that she had had no vomiting and was having normal appetite. No fevers or chills. CT showed acute sigmoid diverticulitis. ER note documents that she was to follow-up with Dr. Cristina Gong on 03/30/2017.  Today she reports that she did see Dr. Cristina Gong and then she also went back to [facility] and saw another doctor there on Friday. Says that she is still having episodes of pain. Says "they can't do anything until that gets better ". "Says or else that might cause more damage ". She states that "this is causing her nerves to be torn up" and is wanting "some more of those nerve pills that I had given her in the past" says that they have been clonazepam's. Also is requesting refill on pain medicine. Noted the ER had prescribed hydrocodone 5/325. She says that even when she would take those that it did not control the pain--- will increase dose to 7.5.   Past Medical History:  Diagnosis Date  . Allergy    rhinitis  . Arthritis   . Breast cancer ([place]) 01/06/2012  . Cancer [facility])    right colon and left breast  . Chronic diastolic CHF (congestive heart failure) ([place])   . Colon cancer ([place]) 01/06/2012  . COPD (chronic obstructive pulmonary disease) ([place])   . Coronary artery disease 2006   nonobstructive with 20-30% ostial LAD and LM  . Dilated aortic root ([facility])    40mmHg  . Diverticulosis   . Edema extremities   . GERD (gastroesophageal reflux disease)    . Hernia   . Hiatal hernia    denies  . Hyperlipidemia   . Hypertension   . Mild aortic stenosis    echo 11/2015 but not noted on echo 06/2016  . Osteopenia   . Permanent atrial fibrillation (H[facility])    chronic atrial fibrillation  . Pneumonia    hx child  . Pulmonary HTN ([place])    moderate to severe PASP 75mmHg echo 11/2015 - now 60mmHg by echo 06/2016  . Takotsubo syndrome 07/29/2015   a. EF 35-40% by echo; akinesis of mid-apical anteroseptal and apical myocardium.  EF now normalized on echo 11/2015     Home Meds: Outpatient Medications Prior to Visit  Medication Sig Dispense Refill  . acetaminophen (TYLENOL) 325 MG tablet Take 650 mg by mouth every 6 (six) hours as needed for mild pain. Reported on 03/15/2016    . amLODipine (NORVASC) 5 MG tablet Take 1 tablet (5 mg total) by mouth daily. 30 tablet 3  . amoxicillin-clavulanate (AUGMENTIN) 875-125 MG tablet Take 1 tablet by mouth 2 (two) times daily. One po bid x 7 days 14 tablet 0  . cetirizine (ZYRTEC) 10 MG tablet Take 1 tablet (10 mg total) by mouth daily. (Patient taking differently: Take 10 mg by mouth daily as needed. ) 30 tablet 11  . cloNIDine (CATAPRES) 0.1 MG tablet TAKE ONE  TABLET BY MOUTH TWICE DAILY 60 tablet 11  . guaiFENesin (MUCINEX) 600 MG 12 hr tablet Take 600 mg by mouth daily as needed for cough or to loosen phlegm.    Marland Kitchen HYDROcodone-acetaminophen (NORCO) 5-325 MG tablet Take 1 tablet by mouth every 6 (six) hours as needed for severe pain. 10 tablet 0  . losartan (COZAAR) 100 MG tablet Take 1 tablet (100 mg total) by mouth daily. 30 tablet 3  . metoprolol succinate (TOPROL-XL) 50 MG 24 hr tablet TAKE ONE TABLET BY MOUTH ONCE DAILY TAKE  WITH  OR  FOLLOWING  A  MEAL 90 tablet 3  . montelukast (SINGULAIR) 10 MG tablet Take 1 tablet (10 mg total) by mouth at bedtime. 30 tablet 3  . nitroGLYCERIN (NITROSTAT) 0.4 MG SL tablet Place 1 tablet (0.4 mg total) under the tongue every 5 (five) minutes as needed for chest pain (x 3  pills). 30 tablet 2  . omeprazole (PRILOSEC) 20 MG capsule Take 1 capsule (20 mg total) by mouth daily as needed (heartburn, acid reflux). 90 capsule 3  . ondansetron (ZOFRAN ODT) 4 MG disintegrating tablet Take 1 tablet (4 mg total) by mouth every 8 (eight) hours as needed for nausea or vomiting. 8 tablet 0  . OVER THE COUNTER MEDICATION Place 1 drop into both eyes daily as needed (dry eyes). Eye drops    . spironolactone (ALDACTONE) 25 MG tablet TAKE ONE-HALF TABLET BY MOUTH ONCE DAILY 45 tablet 3  . torsemide (DEMADEX) 20 MG tablet Take 4 tablets (80 mg total) by mouth daily. (Patient taking differently: Take 80 mg by mouth daily. ) 120 tablet 6  . triamcinolone cream (KENALOG) 0.1 % Apply 1 application topically 2 (two) times daily. 30 g 0  . warfarin (COUMADIN) 1 MG tablet Take 0.5 tablets (0.5 mg total) by mouth as directed. M-W-F-SA (Patient taking differently: Take 3 mg by mouth See admin instructions. Mon/Wed/Fri/Sat) 90 tablet 2  . warfarin (COUMADIN) 5 MG tablet Take 0.5 tablets (2.5 mg total) by mouth See admin instructions. Take half a tablet (2.5 mg) on Tuesdays, Thursdays, and Sundays. Take half a tablet with half a tablet of 1 mg (total 3 mg) on all other days. 90 tablet 2  . valACYclovir (VALTREX) 1000 MG tablet Take 1 tablet (1,000 mg total) by mouth 3 (three) times daily. (Patient not taking: Reported on 03/24/2017) 21 tablet 0   No facility-administered medications prior to visit.     Allergies:  Allergies  Allergen Reactions  . Contrast Media [Iodinated Diagnostic Agents] Other (See Comments) and Hives    Per pt strong burning sensation starting in chest radiating outward Per pt strong burning sensation starting in chest radiating outward  . Clonidine Derivatives Other (See Comments)    Throat dry  . Statins Other (See Comments)    "bones hurt"  . Sulfa Antibiotics Diarrhea    Tremors   . Celebrex [Celecoxib] Rash  . Isosorbide Nitrate Itching and Rash  . Other  Itching and Rash    Plastic and paper tape and heart monitor pads  . Tape Itching and Rash    Red Where applied and will spread    Social History   Social History  . Marital status: Married    Spouse name: N/A  . Number of children: N/A  . Years of education: N/A   Occupational History  . Not on file.   Social History Main Topics  . Smoking status: Never Smoker  . Smokeless tobacco: Never Used  .  Alcohol use No  . Drug use: No  . Sexual activity: Not Currently   Other Topics Concern  . Not on file   Social History Narrative  . No narrative on file    Family History  Problem Relation Age of Onset  . Heart disease Mother   . Heart attack Mother   . Cancer Sister        stomach and colon  . Heart disease Brother 13  . Hypertension Father   . Cancer Sister   . Stroke Neg Hx      Review of Systems:  See HPI for pertinent ROS. All other ROS negative.    Physical Exam: Blood pressure (!) 142/76, pulse 65, temperature 98.1 F (36.7 C), temperature source Oral, resp. rate 16, weight 216 lb 9.6 oz (98.2 kg), SpO2 98 %., Body mass index is 33.92 kg/m. General: WF. Appears in no acute distress. Neck: Supple. No thyromegaly. No lymphadenopathy. Lungs: Clear bilaterally to auscultation without wheezes, rales, or rhonchi. Breathing is unlabored. Heart: Irregular. Musculoskeletal:  Strength and tone normal for age. Extremities/Skin: Warm and dry.  Neuro: Alert and oriented X 3. Moves all extremities spontaneously. Gait is normal. CNII-XII grossly in tact. Psych:  Responds to questions appropriately with a normal affect.     ASSESSMENT AND PLAN:  82 y.o. year old female with  1. Diverticulitis She will f/u with [facility].  I will prescribe meds to help with anxiety and pain. - clonazePAM (KLONOPIN) 0.5 MG tablet; Take 1 tablet (0.5 mg total) by mouth 2 (two) times daily as needed for anxiety.  Dispense: 60 tablet; Refill: 1 - HYDROcodone-acetaminophen (NORCO) 7.5-325  MG tablet; Take 1 tablet by mouth every 6 (six) hours as needed for moderate pain.  Dispense: 30 tablet; Refill: 0  2. Anxiety disorder due to known physiological condition - clonazePAM (KLONOPIN) 0.5 MG tablet; Take 1 tablet (0.5 mg total) by mouth 2 (two) times daily as needed for anxiety.  Dispense: 60 tablet; Refill: 1   Signed, [id number] [place], [place], [facility] 04/04/2017 11:49 AM

## 2017-04-05 ENCOUNTER — Telehealth: Payer: Self-pay

## 2017-04-05 NOTE — Telephone Encounter (Signed)
Lorazepam 0.5mg  tablets has been approved for 1 year starting 04/04/2017-04/04/2018. Pharmacy is aware Per pharmacy patient paid cash price she [place] be due a refund called patient to make her aware lvm

## 2017-04-05 NOTE — Telephone Encounter (Signed)
Prior authorization has been started for clonazepam

## 2017-04-06 DIAGNOSIS — K5792 Diverticulitis of intestine, part unspecified, without perforation or abscess without bleeding: Secondary | ICD-10-CM | POA: Diagnosis not present

## 2017-04-06 DIAGNOSIS — Z7901 Long term (current) use of anticoagulants: Secondary | ICD-10-CM | POA: Diagnosis not present

## 2017-04-06 DIAGNOSIS — I481 Persistent atrial fibrillation: Secondary | ICD-10-CM | POA: Diagnosis not present

## 2017-04-06 DIAGNOSIS — R1030 Lower abdominal pain, unspecified: Secondary | ICD-10-CM | POA: Diagnosis not present

## 2017-04-07 ENCOUNTER — Ambulatory Visit: Payer: Medicare Other | Admitting: Physician Assistant

## 2017-04-13 DIAGNOSIS — K5792 Diverticulitis of intestine, part unspecified, without perforation or abscess without bleeding: Secondary | ICD-10-CM | POA: Diagnosis not present

## 2017-04-21 ENCOUNTER — Encounter: Payer: Self-pay | Admitting: Physician Assistant

## 2017-04-21 ENCOUNTER — Ambulatory Visit (INDEPENDENT_AMBULATORY_CARE_PROVIDER_SITE_OTHER): Payer: Medicare Other | Admitting: Physician Assistant

## 2017-04-21 VITALS — BP 140/78 | HR 66 | Temp 97.9°F | Resp 18 | Wt 214.8 lb

## 2017-04-21 DIAGNOSIS — G4700 Insomnia, unspecified: Secondary | ICD-10-CM

## 2017-04-21 DIAGNOSIS — J029 Acute pharyngitis, unspecified: Secondary | ICD-10-CM | POA: Diagnosis not present

## 2017-04-21 LAB — STREP GROUP A AG, W/REFLEX TO CULT: STREGTOCOCCUS GROUP A AG SCREEN: NOT DETECTED

## 2017-04-21 MED ORDER — MIRTAZAPINE 15 MG PO TABS: 15.0000 mg | ORAL_TABLET | Freq: Every day | ORAL | 2 refills | Status: DC

## 2017-04-21 NOTE — Progress Notes (Signed)
Patient ID: [name] MRN: [id number], DOB: Aug 12, 1935, 82 y.o. Date of Encounter: 04/21/2017, 9:33 AM    Chief Complaint:  Chief Complaint  Patient presents with  . Fatigue  . Sore Throat    x3-4 days      HPI: 82 y.o. year old female presents with above.   She reports that she has had sore throat for about 3 days. Says that she can feel drainage running down her throat. Says that her throat feels source that she has been using some spray. She has not been blowing any thick dark mucus from her nose. She has had no known fevers. No chest congestion or deep cough. Only from drainage in her throat.  She states that she sleeps until midnight and then wakes up and can't get back to sleep. Says this is been going on for over a month. She says that she will start to cry for no reason. At that time she starts to cry and has tears come out of her eyes for a minute and then this stops. Asked if anything is going on in her life that [place] be causing all of this. Asked if anything is going on with her husband or her son etc. or if there is been changes with anything. She says nothing has changed and there are no stressors that she can think of.  Reviewed that she is on Klonopin. Asked when she takes this. Says that she takes the Klonopin before she goes to bed at around 8 or 9 PM. Then sleeps until about midnight but then wakes up and cannot get back to sleep after that. Asked if she can remember if she has used other medications to help with sleep in the past. She can remember one medicine that did not work but can't remember what it was called. I reviewed chart and found note on 10/17/15 that documents "trazodone not effective ".     Home Meds:   Outpatient Medications Prior to Visit  Medication Sig Dispense Refill  . acetaminophen (TYLENOL) 325 MG tablet Take 650 mg by mouth every 6 (six) hours as needed for mild pain. Reported on 03/15/2016    . amLODipine (NORVASC) 5 MG tablet Take 1 tablet  (5 mg total) by mouth daily. 30 tablet 3  . amoxicillin-clavulanate (AUGMENTIN) 875-125 MG tablet Take 1 tablet by mouth 2 (two) times daily. One po bid x 7 days 14 tablet 0  . cetirizine (ZYRTEC) 10 MG tablet Take 1 tablet (10 mg total) by mouth daily. (Patient taking differently: Take 10 mg by mouth daily as needed. ) 30 tablet 11  . clonazePAM (KLONOPIN) 0.5 MG tablet Take 1 tablet (0.5 mg total) by mouth 2 (two) times daily as needed for anxiety. 60 tablet 1  . cloNIDine (CATAPRES) 0.1 MG tablet TAKE ONE TABLET BY MOUTH TWICE DAILY 60 tablet 11  . guaiFENesin (MUCINEX) 600 MG 12 hr tablet Take 600 mg by mouth daily as needed for cough or to loosen phlegm.    Marland Kitchen HYDROcodone-acetaminophen (NORCO) 7.5-325 MG tablet Take 1 tablet by mouth every 6 (six) hours as needed for moderate pain. 30 tablet 0  . losartan (COZAAR) 100 MG tablet Take 1 tablet (100 mg total) by mouth daily. 30 tablet 3  . metoprolol succinate (TOPROL-XL) 50 MG 24 hr tablet TAKE ONE TABLET BY MOUTH ONCE DAILY TAKE  WITH  OR  FOLLOWING  A  MEAL 90 tablet 3  . montelukast (SINGULAIR) 10 MG tablet  Take 1 tablet (10 mg total) by mouth at bedtime. 30 tablet 3  . nitroGLYCERIN (NITROSTAT) 0.4 MG SL tablet Place 1 tablet (0.4 mg total) under the tongue every 5 (five) minutes as needed for chest pain (x 3 pills). 30 tablet 2  . omeprazole (PRILOSEC) 20 MG capsule Take 1 capsule (20 mg total) by mouth daily as needed (heartburn, acid reflux). 90 capsule 3  . ondansetron (ZOFRAN ODT) 4 MG disintegrating tablet Take 1 tablet (4 mg total) by mouth every 8 (eight) hours as needed for nausea or vomiting. 8 tablet 0  . OVER THE COUNTER MEDICATION Place 1 drop into both eyes daily as needed (dry eyes). Eye drops    . spironolactone (ALDACTONE) 25 MG tablet TAKE ONE-HALF TABLET BY MOUTH ONCE DAILY 45 tablet 3  . torsemide (DEMADEX) 20 MG tablet Take 4 tablets (80 mg total) by mouth daily. (Patient taking differently: Take 80 mg by mouth daily. ) 120  tablet 6  . triamcinolone cream (KENALOG) 0.1 % Apply 1 application topically 2 (two) times daily. 30 g 0  . warfarin (COUMADIN) 1 MG tablet Take 0.5 tablets (0.5 mg total) by mouth as directed. M-W-F-SA (Patient taking differently: Take 3 mg by mouth See admin instructions. Mon/Wed/Fri/Sat) 90 tablet 2  . warfarin (COUMADIN) 5 MG tablet Take 0.5 tablets (2.5 mg total) by mouth See admin instructions. Take half a tablet (2.5 mg) on Tuesdays, Thursdays, and Sundays. Take half a tablet with half a tablet of 1 mg (total 3 mg) on all other days. 90 tablet 2  . HYDROcodone-acetaminophen (NORCO) 5-325 MG tablet Take 1 tablet by mouth every 6 (six) hours as needed for severe pain. 10 tablet 0   No facility-administered medications prior to visit.     Allergies:  Allergies  Allergen Reactions  . Contrast Media [Iodinated Diagnostic Agents] Other (See Comments) and Hives    Per pt strong burning sensation starting in chest radiating outward Per pt strong burning sensation starting in chest radiating outward  . Clonidine Derivatives Other (See Comments)    Throat dry  . Statins Other (See Comments)    "bones hurt"  . Sulfa Antibiotics Diarrhea    Tremors   . Celebrex [Celecoxib] Rash  . Isosorbide Nitrate Itching and Rash  . Other Itching and Rash    Plastic and paper tape and heart monitor pads  . Tape Itching and Rash    Red Where applied and will spread      Review of Systems: See HPI for pertinent ROS. All other ROS negative.    Physical Exam: Blood pressure 140/78, pulse 66, temperature 97.9 F (36.6 C), temperature source Oral, resp. rate 18, weight 214 lb 12.8 oz (97.4 kg), SpO2 97 %., Body mass index is 33.64 kg/m. General:  Obese WF. Appears in no acute distress. HEENT: Normocephalic, atraumatic, eyes without discharge, sclera non-icteric, nares are without discharge. Bilateral auditory canals clear, TM's are without perforation, pearly grey and translucent with reflective cone  of light bilaterally. Oral cavity moist, posterior pharynx with mild erythema, no exudate, no peritonsillar abscess.  Neck: Supple. No thyromegaly. She reports tenderness with palpation to tonsillar nodes bilaterally but I feel no enlarged nodes. No tenderness or enlargement of anterior cervical nodes. Lungs: Clear bilaterally to auscultation without wheezes, rales, or rhonchi. Breathing is unlabored. Heart: Regular rhythm. No murmurs, rubs, or gallops. Msk:  Strength and tone normal for age. Extremities/Skin: Warm and dry.  No rashes. Neuro: Alert and oriented X 3. Moves  all extremities spontaneously. Gait is normal. CNII-XII grossly in tact. Psych:  Responds to questions appropriately with a normal affect.   Results for orders placed or performed in visit on 04/21/17  STREP GROUP A AG, W/REFLEX TO CULT  Result Value Ref Range   SOURCE THROAT    STREGTOCOCCUS GROUP A AG SCREEN Not Detected      ASSESSMENT AND PLAN:  82 y.o. year old female with  1. Viral pharyngitis Reviewed with her that strep test is negative. At this time recommend she use their tach to help dry up drainage that she is feeling down her throat and which is further irritating her throat. Sick and continue the spray and can also use drops to help numb her throat. If, despite this treatment, symptoms worsen or are not resolving after 1 week, then call me.  2. Insomnia, unspecified type Discussed that her lack of sleep Gloss be contributing to her feeling tired and even the fact that she is feeling like she can cry easily "for no reason ". Discussed that -- will try another medicine to help her sleep and then I will have her come in for follow-up visit to see if her other symptoms improve when she is getting adequate sleep. - mirtazapine (REMERON) 15 MG tablet; Take 1 tablet (15 mg total) by mouth at bedtime.  Dispense: 30 tablet; Refill: 2   Follow up office visit 1 week or sooner if needed.  3. Sore throat - STREP GROUP  A AG, W/REFLEX TO CULT   Signed, [address] [place], [place], [facility] 04/21/2017 9:33 AM

## 2017-04-22 ENCOUNTER — Other Ambulatory Visit: Payer: Self-pay | Admitting: Physician Assistant

## 2017-04-22 DIAGNOSIS — R1032 Left lower quadrant pain: Secondary | ICD-10-CM | POA: Diagnosis not present

## 2017-04-22 DIAGNOSIS — R1031 Right lower quadrant pain: Secondary | ICD-10-CM | POA: Diagnosis not present

## 2017-04-22 DIAGNOSIS — Z8719 Personal history of other diseases of the digestive system: Secondary | ICD-10-CM

## 2017-04-22 DIAGNOSIS — R197 Diarrhea, unspecified: Secondary | ICD-10-CM | POA: Diagnosis not present

## 2017-04-22 DIAGNOSIS — R634 Abnormal weight loss: Secondary | ICD-10-CM | POA: Diagnosis not present

## 2017-04-22 LAB — CULTURE, GROUP A STREP

## 2017-04-25 ENCOUNTER — Other Ambulatory Visit: Payer: Self-pay | Admitting: Physician Assistant

## 2017-04-25 DIAGNOSIS — K5792 Diverticulitis of intestine, part unspecified, without perforation or abscess without bleeding: Secondary | ICD-10-CM

## 2017-04-25 DIAGNOSIS — G8929 Other chronic pain: Secondary | ICD-10-CM

## 2017-04-25 MED ORDER — HYDROCODONE-ACETAMINOPHEN 7.5-325 MG PO TABS: 1.0000 | ORAL_TABLET | Freq: Four times a day (QID) | ORAL | 0 refills | Status: DC | PRN

## 2017-04-25 NOTE — Telephone Encounter (Signed)
Drug registry printed and reviewed. Refill appropriate. #[id number].

## 2017-04-25 NOTE — Telephone Encounter (Signed)
Pt needs refill on hydrocodone.  °

## 2017-04-25 NOTE — Telephone Encounter (Signed)
patient can pick RX  today

## 2017-04-25 NOTE — Telephone Encounter (Signed)
Last OV 7/30 Last refill 7/30 Ok to refill?

## 2017-04-26 DIAGNOSIS — R197 Diarrhea, unspecified: Secondary | ICD-10-CM | POA: Diagnosis not present

## 2017-04-27 ENCOUNTER — Ambulatory Visit
Admission: RE | Admit: 2017-04-27 | Discharge: 2017-04-27 | Disposition: A | Discharge status: Home / Self Care | Payer: Medicare Other | Source: Ambulatory Visit | Attending: Physician Assistant | Admitting: Physician Assistant

## 2017-04-27 DIAGNOSIS — K5732 Diverticulitis of large intestine without perforation or abscess without bleeding: Secondary | ICD-10-CM | POA: Diagnosis not present

## 2017-04-27 DIAGNOSIS — Z8719 Personal history of other diseases of the digestive system: Secondary | ICD-10-CM

## 2017-04-27 DIAGNOSIS — R634 Abnormal weight loss: Secondary | ICD-10-CM

## 2017-04-27 DIAGNOSIS — R1032 Left lower quadrant pain: Secondary | ICD-10-CM

## 2017-04-27 DIAGNOSIS — R1031 Right lower quadrant pain: Secondary | ICD-10-CM

## 2017-04-28 ENCOUNTER — Ambulatory Visit (INDEPENDENT_AMBULATORY_CARE_PROVIDER_SITE_OTHER): Payer: Medicare Other | Admitting: Physician Assistant

## 2017-04-28 ENCOUNTER — Encounter: Payer: Self-pay | Admitting: Physician Assistant

## 2017-04-28 VITALS — BP 140/60 | HR 71 | Temp 97.9°F | Resp 14 | Ht 66.0 in | Wt 215.2 lb

## 2017-04-28 DIAGNOSIS — G4700 Insomnia, unspecified: Secondary | ICD-10-CM

## 2017-04-28 NOTE — Progress Notes (Signed)
Patient ID: [name] MRN: [id number], DOB: 11/09/1934, 82 y.o. Date of Encounter: 04/28/2017, 11:22 AM    Chief Complaint:  Chief Complaint  Patient presents with  . 1 week follow up     HPI: 82 y.o. year old female presents with above.   THE FOLLOWING IS COPIED FROM HER OV NOTE WITH ME ON 04/21/2017:  She states that she sleeps until midnight and then wakes up and can't get back to sleep. Says this is been going on for over a month. She says that she will start to cry for no reason. At that time she starts to cry and has tears come out of her eyes for a minute and then this stops. Asked if anything is going on in her life that Ly be causing all of this. Asked if anything is going on with her husband or her son etc. or if there is been changes with anything. She says nothing has changed and there are no stressors that she can think of.  Reviewed that she is on Klonopin. Asked when she takes this. Says that she takes the Klonopin before she goes to bed at around 8 or 9 PM. Then sleeps until about midnight but then wakes up and cannot get back to sleep after that. Asked if she can remember if she has used other medications to help with sleep in the past. She can remember one medicine that did not work but can't remember what it was called. I reviewed chart and found note on 10/17/15 that documents "trazodone not effective ".   ASSESSMENT/PLAN AT THAT OV INCLUDED: Insomnia, unspecified type Discussed that her lack of sleep [place] be contributing to her feeling tired and even the fact that she is feeling like she can cry easily "for no reason ". Discussed that -- will try another medicine to help her sleep and then I will have her come in for follow-up visit to see if her other symptoms improve when she is getting adequate sleep. - mirtazapine (REMERON) 15 MG tablet; Take 1 tablet (15 mg total) by mouth at bedtime.  Dispense: 30 tablet; Refill: 2  Follow up office visit 1 week or sooner if  needed.    04/28/2017: Today she presents for that 1 week follow-up visit. However today she states that she did go get the new prescription filled but then never took any because she read the possible side effects. Sometimes continue to be similar as they were at last visit. No other concerns to address today.  Home Meds:   Outpatient Medications Prior to Visit  Medication Sig Dispense Refill  . acetaminophen (TYLENOL) 325 MG tablet Take 650 mg by mouth every 6 (six) hours as needed for mild pain. Reported on 03/15/2016    . amLODipine (NORVASC) 5 MG tablet Take 1 tablet (5 mg total) by mouth daily. 30 tablet 3  . cetirizine (ZYRTEC) 10 MG tablet Take 1 tablet (10 mg total) by mouth daily. (Patient taking differently: Take 10 mg by mouth daily as needed. ) 30 tablet 11  . clonazePAM (KLONOPIN) 0.5 MG tablet Take 1 tablet (0.5 mg total) by mouth 2 (two) times daily as needed for anxiety. 60 tablet 1  . cloNIDine (CATAPRES) 0.1 MG tablet TAKE ONE TABLET BY MOUTH TWICE DAILY 60 tablet 11  . guaiFENesin (MUCINEX) 600 MG 12 hr tablet Take 600 mg by mouth daily as needed for cough or to loosen phlegm.    Marland Kitchen HYDROcodone-acetaminophen (NORCO) 7.5-325 MG  tablet Take 1 tablet by mouth every 6 (six) hours as needed for moderate pain. 30 tablet 0  . losartan (COZAAR) 100 MG tablet Take 1 tablet (100 mg total) by mouth daily. 30 tablet 3  . metoprolol succinate (TOPROL-XL) 50 MG 24 hr tablet TAKE ONE TABLET BY MOUTH ONCE DAILY TAKE  WITH  OR  FOLLOWING  A  MEAL 90 tablet 3  . mirtazapine (REMERON) 15 MG tablet Take 1 tablet (15 mg total) by mouth at bedtime. 30 tablet 2  . montelukast (SINGULAIR) 10 MG tablet Take 1 tablet (10 mg total) by mouth at bedtime. 30 tablet 3  . nitroGLYCERIN (NITROSTAT) 0.4 MG SL tablet Place 1 tablet (0.4 mg total) under the tongue every 5 (five) minutes as needed for chest pain (x 3 pills). 30 tablet 2  . omeprazole (PRILOSEC) 20 MG capsule Take 1 capsule (20 mg total) by mouth  daily as needed (heartburn, acid reflux). 90 capsule 3  . ondansetron (ZOFRAN ODT) 4 MG disintegrating tablet Take 1 tablet (4 mg total) by mouth every 8 (eight) hours as needed for nausea or vomiting. 8 tablet 0  . OVER THE COUNTER MEDICATION Place 1 drop into both eyes daily as needed (dry eyes). Eye drops    . spironolactone (ALDACTONE) 25 MG tablet TAKE ONE-HALF TABLET BY MOUTH ONCE DAILY 45 tablet 3  . torsemide (DEMADEX) 20 MG tablet Take 4 tablets (80 mg total) by mouth daily. (Patient taking differently: Take 80 mg by mouth daily. ) 120 tablet 6  . triamcinolone cream (KENALOG) 0.1 % Apply 1 application topically 2 (two) times daily. 30 g 0  . warfarin (COUMADIN) 1 MG tablet Take 0.5 tablets (0.5 mg total) by mouth as directed. M-W-F-SA (Patient taking differently: Take 3 mg by mouth See admin instructions. Mon/Wed/Fri/Sat) 90 tablet 2  . warfarin (COUMADIN) 5 MG tablet Take 0.5 tablets (2.5 mg total) by mouth See admin instructions. Take half a tablet (2.5 mg) on Tuesdays, Thursdays, and Sundays. Take half a tablet with half a tablet of 1 mg (total 3 mg) on all other days. 90 tablet 2  . amoxicillin-clavulanate (AUGMENTIN) 875-125 MG tablet Take 1 tablet by mouth 2 (two) times daily. One po bid x 7 days 14 tablet 0   No facility-administered medications prior to visit.     Allergies:  Allergies  Allergen Reactions  . Contrast Media [Iodinated Diagnostic Agents] Other (See Comments) and Hives    Per pt strong burning sensation starting in chest radiating outward Per pt strong burning sensation starting in chest radiating outward  . Clonidine Derivatives Other (See Comments)    Throat dry  . Statins Other (See Comments)    "bones hurt"  . Sulfa Antibiotics Diarrhea    Tremors   . Celebrex [Celecoxib] Rash  . Isosorbide Nitrate Itching and Rash  . Other Itching and Rash    Plastic and paper tape and heart monitor pads  . Tape Itching and Rash    Red Where applied and will spread        Review of Systems: See HPI for pertinent ROS. All other ROS negative.    Physical Exam: Blood pressure 140/60, pulse 71, temperature 97.9 F (36.6 C), temperature source Oral, resp. rate 14, height 5\' 6"  (1.676 m), weight 215 lb 3.2 oz (97.6 kg), SpO2 98 %., Body mass index is 34.73 kg/m. General:  Obese WF. Appears in no acute distress. Neck: Supple. No thyromegaly.  Lungs: Clear bilaterally to auscultation without wheezes,  rales, or rhonchi. Breathing is unlabored. Heart: Regular rhythm. No murmurs, rubs, or gallops. Msk:  Strength and tone normal for age. Extremities/Skin: Warm and dry.  No rashes. Neuro: Alert and oriented X 3. Moves all extremities spontaneously. Gait is normal. CNII-XII grossly in tact. Psych:  Responds to questions appropriately with a normal affect.      ASSESSMENT AND PLAN:  82 y.o. year old female with   Insomnia, unspecified type Today I discussed with patient that all medications have multiple possible side effects documented. Many of the medicines that she is on currently have multiple possible side effects. Also discussed that if this medication were to cause a side effect then she simply stops it and calls  And informs me. Also discussed that any medication that we use for sleep is going to list possible side effects which will all be similar to the ones documented with this medication. She is to go ahead and start taking the medication each night and then have follow-up visit with me in one week. If she thinks she is having a side effect to the medicine, then call sooner.    Signed, [address] [place], [place], [facility] 04/28/2017 11:22 AM

## 2017-05-03 DIAGNOSIS — L300 Nummular dermatitis: Secondary | ICD-10-CM | POA: Diagnosis not present

## 2017-05-04 ENCOUNTER — Ambulatory Visit (INDEPENDENT_AMBULATORY_CARE_PROVIDER_SITE_OTHER): Payer: Medicare Other | Admitting: Physician Assistant

## 2017-05-04 ENCOUNTER — Encounter: Payer: Self-pay | Admitting: Physician Assistant

## 2017-05-04 VITALS — BP 118/70 | HR 71 | Temp 98.0°F | Resp 14 | Ht 66.0 in | Wt 212.0 lb

## 2017-05-04 DIAGNOSIS — G4700 Insomnia, unspecified: Secondary | ICD-10-CM

## 2017-05-04 MED ORDER — MIRTAZAPINE 30 MG PO TABS: 30.0000 mg | ORAL_TABLET | Freq: Every day | ORAL | 1 refills | Status: DC

## 2017-05-04 NOTE — Progress Notes (Signed)
Patient ID: [name] MRN: [id number], DOB: [birth year], 82 y.o. Date of Encounter: 05/04/2017, 9:05 AM    Chief Complaint:  Chief Complaint  Patient presents with  . 1 week follow up     HPI: 82 y.o. year old female presents with above.   THE FOLLOWING IS COPIED FROM HER OV NOTE WITH ME ON 04/21/2017:  She states that she sleeps until midnight and then wakes up and can't get back to sleep. Says this is been going on for over a month. She says that she will start to cry for no reason. At that time she starts to cry and has tears come out of her eyes for a minute and then this stops. Asked if anything is going on in her life that [place] be causing all of this. Asked if anything is going on with her husband or her son etc. or if there is been changes with anything. She says nothing has changed and there are no stressors that she can think of.  Reviewed that she is on Klonopin. Asked when she takes this. Says that she takes the Klonopin before she goes to bed at around 8 or 9 PM. Then sleeps until about midnight but then wakes up and cannot get back to sleep after that. Asked if she can remember if she has used other medications to help with sleep in the past. She can remember one medicine that did not work but can't remember what it was called. I reviewed chart and found note on 10/17/15 that documents "trazodone not effective ".   ASSESSMENT/PLAN AT THAT OV INCLUDED: Insomnia, unspecified type Discussed that her lack of sleep [place] be contributing to her feeling tired and even the fact that she is feeling like she can cry easily "for no reason ". Discussed that -- will try another medicine to help her sleep and then I will have her come in for follow-up visit to see if her other symptoms improve when she is getting adequate sleep. - mirtazapine (REMERON) 15 MG tablet; Take 1 tablet (15 mg total) by mouth at bedtime.  Dispense: 30 tablet; Refill: 2  Follow up office visit 1 week or sooner if  needed.    04/28/2017: Today she presents for that 1 week follow-up visit. However today she states that she did go get the new prescription filled but then never took any because she read the possible side effects. Symptoms continue to be similar as they were at last visit. No other concerns to address today. Insomnia, unspecified type Today I discussed with patient that all medications have multiple possible side effects documented. Many of the medicines that she is on currently have multiple possible side effects. Also discussed that if this medication were to cause a side effect, then she simply stop it and calls and informs me. Also discussed that any medication that we use for sleep is going to list possible side effects which will all be similar to the ones documented with this medication. She is to go ahead and start taking the medication each night and then have follow-up visit with me in one week. If she thinks she is having a side effect to the medicine, then call sooner.   05/04/2017: Today she reports that she has been taking the Remeron 15 mg every night. Says that since taking this medicine she has had 2 nights where she slept all night. Says the first night she took it and then again on Saturday night.  She says that during the day she sometimes catches herself "in a stare "and doesn't know if this is secondary to the medicine but otherwise has noticed no other adverse effects. No other concerns to address today.     Home Meds:   Outpatient Medications Prior to Visit  Medication Sig Dispense Refill  . acetaminophen (TYLENOL) 325 MG tablet Take 650 mg by mouth every 6 (six) hours as needed for mild pain. Reported on 03/15/2016    . amLODipine (NORVASC) 5 MG tablet Take 1 tablet (5 mg total) by mouth daily. 30 tablet 3  . cetirizine (ZYRTEC) 10 MG tablet Take 1 tablet (10 mg total) by mouth daily. (Patient taking differently: Take 10 mg by mouth daily as needed. ) 30 tablet 11  .  clonazePAM (KLONOPIN) 0.5 MG tablet Take 1 tablet (0.5 mg total) by mouth 2 (two) times daily as needed for anxiety. 60 tablet 1  . cloNIDine (CATAPRES) 0.1 MG tablet TAKE ONE TABLET BY MOUTH TWICE DAILY 60 tablet 11  . guaiFENesin (MUCINEX) 600 MG 12 hr tablet Take 600 mg by mouth daily as needed for cough or to loosen phlegm.    Marland Kitchen HYDROcodone-acetaminophen (NORCO) 7.5-325 MG tablet Take 1 tablet by mouth every 6 (six) hours as needed for moderate pain. 30 tablet 0  . losartan (COZAAR) 100 MG tablet Take 1 tablet (100 mg total) by mouth daily. 30 tablet 3  . metoprolol succinate (TOPROL-XL) 50 MG 24 hr tablet TAKE ONE TABLET BY MOUTH ONCE DAILY TAKE  WITH  OR  FOLLOWING  A  MEAL 90 tablet 3  . mirtazapine (REMERON) 15 MG tablet Take 1 tablet (15 mg total) by mouth at bedtime. 30 tablet 2  . montelukast (SINGULAIR) 10 MG tablet Take 1 tablet (10 mg total) by mouth at bedtime. 30 tablet 3  . nitroGLYCERIN (NITROSTAT) 0.4 MG SL tablet Place 1 tablet (0.4 mg total) under the tongue every 5 (five) minutes as needed for chest pain (x 3 pills). 30 tablet 2  . omeprazole (PRILOSEC) 20 MG capsule Take 1 capsule (20 mg total) by mouth daily as needed (heartburn, acid reflux). 90 capsule 3  . ondansetron (ZOFRAN ODT) 4 MG disintegrating tablet Take 1 tablet (4 mg total) by mouth every 8 (eight) hours as needed for nausea or vomiting. 8 tablet 0  . OVER THE COUNTER MEDICATION Place 1 drop into both eyes daily as needed (dry eyes). Eye drops    . spironolactone (ALDACTONE) 25 MG tablet TAKE ONE-HALF TABLET BY MOUTH ONCE DAILY 45 tablet 3  . torsemide (DEMADEX) 20 MG tablet Take 4 tablets (80 mg total) by mouth daily. (Patient taking differently: Take 80 mg by mouth daily. ) 120 tablet 6  . triamcinolone cream (KENALOG) 0.1 % Apply 1 application topically 2 (two) times daily. 30 g 0  . warfarin (COUMADIN) 1 MG tablet Take 0.5 tablets (0.5 mg total) by mouth as directed. M-W-F-SA (Patient taking differently: Take 3  mg by mouth See admin instructions. Mon/Wed/Fri/Sat) 90 tablet 2  . warfarin (COUMADIN) 5 MG tablet Take 0.5 tablets (2.5 mg total) by mouth See admin instructions. Take half a tablet (2.5 mg) on Tuesdays, Thursdays, and Sundays. Take half a tablet with half a tablet of 1 mg (total 3 mg) on all other days. 90 tablet 2   No facility-administered medications prior to visit.     Allergies:  Allergies  Allergen Reactions  . Contrast Media [Iodinated Diagnostic Agents] Other (See Comments) and Hives  Per pt strong burning sensation starting in chest radiating outward Per pt strong burning sensation starting in chest radiating outward  . Clonidine Derivatives Other (See Comments)    Throat dry  . Statins Other (See Comments)    "bones hurt"  . Sulfa Antibiotics Diarrhea    Tremors   . Celebrex [Celecoxib] Rash  . Isosorbide Nitrate Itching and Rash  . Other Itching and Rash    Plastic and paper tape and heart monitor pads  . Tape Itching and Rash    Red Where applied and will spread      Review of Systems: See HPI for pertinent ROS. All other ROS negative.    Physical Exam: Blood pressure 118/70, pulse 71, temperature 98 F (36.7 C), temperature source Oral, resp. rate 14, height 5\' 6"  (1.676 m), weight 212 lb (96.2 kg), SpO2 97 %., There is no height or weight on file to calculate BMI. General:  Obese WF. Appears in no acute distress. Neck: Supple. No thyromegaly.  Lungs: Clear bilaterally to auscultation without wheezes, rales, or rhonchi. Breathing is unlabored. Heart: Regular rhythm. No murmurs, rubs, or gallops. Msk:  Strength and tone normal for age. Extremities/Skin: Warm and dry.  No rashes. Neuro: Alert and oriented X 3. Moves all extremities spontaneously. Gait is normal. CNII-XII grossly in tact. Psych:  Responds to questions appropriately with a normal affect.      ASSESSMENT AND PLAN:  82 y.o. year old female with   Insomnia, unspecified type Will increase  the dose from 15 mg to 30 mg daily at bedtime. We'll see if this helps her get better sleep. Also follow-up to make sure that this does not cause side effects. Discussed another follow-up visit in one week but she states that she and her husband have a lot of appointments next week and asked if she can wait to follow-up 2 weeks. This is fine. Told her to call me sooner if thinks that she is having any side effects to the medicine.   Signed, [address] [place], [place], [facility] 05/04/2017 9:05 AM

## 2017-05-10 DIAGNOSIS — E878 Other disorders of electrolyte and fluid balance, not elsewhere classified: Secondary | ICD-10-CM | POA: Diagnosis not present

## 2017-05-10 DIAGNOSIS — E871 Hypo-osmolality and hyponatremia: Secondary | ICD-10-CM | POA: Diagnosis not present

## 2017-05-20 ENCOUNTER — Ambulatory Visit (INDEPENDENT_AMBULATORY_CARE_PROVIDER_SITE_OTHER): Payer: Medicare Other | Admitting: Family Medicine

## 2017-05-20 ENCOUNTER — Encounter: Payer: Self-pay | Admitting: Family Medicine

## 2017-05-20 VITALS — BP 140/68 | HR 78 | Temp 98.4°F | Resp 16 | Ht 66.0 in | Wt 212.0 lb

## 2017-05-20 DIAGNOSIS — S51011A Laceration without foreign body of right elbow, initial encounter: Secondary | ICD-10-CM

## 2017-05-20 NOTE — Progress Notes (Signed)
-   Subjective:    Patient ID: [name], female    DOB: Oct 01, 1934, 82 y.o.   MRN: [id number]  HPI Yesterday, the patient injured her dorsal right elbow on a storm door as she was walking out of her home. She suffered a U-shaped skin tear just distal to the lateral epicondyles. It is 2 cm x 1.5 cm in diameter. There is no evidence of erythema or infection. It appears to be healing well. The skin flap is adherent to the underlying tissue. Past Medical History:  Diagnosis Date  . Allergy    rhinitis  . Arthritis   . Breast cancer ([place]) 01/06/2012  . Cancer [facility])    right colon and left breast  . Chronic diastolic CHF (congestive heart failure) ([place])   . Colon cancer ([place]) 01/06/2012  . COPD (chronic obstructive pulmonary disease) ([place])   . Coronary artery disease 2006   nonobstructive with 20-30% ostial LAD and LM  . Dilated aortic root ([facility])    101mmHg  . Diverticulosis   . Edema extremities   . GERD (gastroesophageal reflux disease)   . Hernia   . Hiatal hernia    denies  . Hyperlipidemia   . Hypertension   . Mild aortic stenosis    echo 11/2015 but not noted on echo 06/2016  . Osteopenia   . Permanent atrial fibrillation ([facility])    chronic atrial fibrillation  . Pneumonia    hx child  . Pulmonary HTN ([place])    moderate to severe PASP 12mmHg echo 11/2015 - now 69mmHg by echo 06/2016  . Takotsubo syndrome 07/29/2015   a. EF 35-40% by echo; akinesis of mid-apical anteroseptal and apical myocardium.  EF now normalized on echo 11/2015   Past Surgical History:  Procedure Laterality Date  . APPENDECTOMY    . BREAST SURGERY     lumpectomy left  . CARDIAC CATHETERIZATION    . CARDIAC CATHETERIZATION N/A 07/28/2015   Procedure: Left Heart Cath and Coronary Angiography;  Surgeon: Peter M Martinique, MD;  Location: [facility];  Service: Cardiovascular;  Laterality: N/A;    . CHOLECYSTECTOMY    . COLECTOMY     right side  . EXCISION OF ACCESSORY NIPPLE Bilateral 05/30/2013   Procedure: BILATERAL NIPPLE BIOPSY;  Surgeon: Merrie Roof, MD;  Location: [facility];  Service: General;  Laterality: Bilateral;  . EYE SURGERY Bilateral 12   cataracts  . MASTECTOMY PARTIAL / LUMPECTOMY  2008   left breast  . SPLIT NIGHT STUDY  02/02/2016       Current Outpatient Prescriptions on File Prior to Visit  Medication Sig Dispense Refill  . acetaminophen (TYLENOL) 325 MG tablet Take 650 mg by mouth every 6 (six) hours as needed for mild pain. Reported on 03/15/2016    . amLODipine (NORVASC) 5 MG tablet Take 1 tablet (5 mg total) by mouth daily. 30 tablet 3  . cetirizine (ZYRTEC) 10 MG tablet Take 1 tablet (10 mg total) by mouth daily. (Patient taking differently: Take 10 mg by mouth daily as needed. ) 30 tablet 11  . clonazePAM (KLONOPIN) 0.5 MG tablet Take 1 tablet (0.5 mg  total) by mouth 2 (two) times daily as needed for anxiety. 60 tablet 1  . cloNIDine (CATAPRES) 0.1 MG tablet TAKE ONE TABLET BY MOUTH TWICE DAILY 60 tablet 11  . guaiFENesin (MUCINEX) 600 MG 12 hr tablet Take 600 mg by mouth daily as needed for cough or to loosen phlegm.    Marland Kitchen HYDROcodone-acetaminophen (NORCO) 7.5-325 MG tablet Take 1 tablet by mouth every 6 (six) hours as needed for moderate pain. 30 tablet 0  . losartan (COZAAR) 100 MG tablet Take 1 tablet (100 mg total) by mouth daily. 30 tablet 3  . metoprolol succinate (TOPROL-XL) 50 MG 24 hr tablet TAKE ONE TABLET BY MOUTH ONCE DAILY TAKE  WITH  OR  FOLLOWING  A  MEAL 90 tablet 3  . mirtazapine (REMERON) 30 MG tablet Take 1 tablet (30 mg total) by mouth at bedtime. 30 tablet 1  . montelukast (SINGULAIR) 10 MG tablet Take 1 tablet (10 mg total) by mouth at bedtime. 30 tablet 3  . nitroGLYCERIN (NITROSTAT) 0.4 MG SL tablet Place 1 tablet (0.4 mg total) under the tongue every 5 (five) minutes as needed for chest pain (x 3 pills). 30 tablet 2  . omeprazole  (PRILOSEC) 20 MG capsule Take 1 capsule (20 mg total) by mouth daily as needed (heartburn, acid reflux). 90 capsule 3  . ondansetron (ZOFRAN ODT) 4 MG disintegrating tablet Take 1 tablet (4 mg total) by mouth every 8 (eight) hours as needed for nausea or vomiting. 8 tablet 0  . OVER THE COUNTER MEDICATION Place 1 drop into both eyes daily as needed (dry eyes). Eye drops    . spironolactone (ALDACTONE) 25 MG tablet TAKE ONE-HALF TABLET BY MOUTH ONCE DAILY 45 tablet 3  . torsemide (DEMADEX) 20 MG tablet Take 4 tablets (80 mg total) by mouth daily. (Patient taking differently: Take 80 mg by mouth daily. ) 120 tablet 6  . triamcinolone cream (KENALOG) 0.1 % Apply 1 application topically 2 (two) times daily. 30 g 0  . warfarin (COUMADIN) 1 MG tablet Take 0.5 tablets (0.5 mg total) by mouth as directed. M-W-F-SA (Patient taking differently: Take 3 mg by mouth See admin instructions. Mon/Wed/Fri/Sat) 90 tablet 2  . warfarin (COUMADIN) 5 MG tablet Take 0.5 tablets (2.5 mg total) by mouth See admin instructions. Take half a tablet (2.5 mg) on Tuesdays, Thursdays, and Sundays. Take half a tablet with half a tablet of 1 mg (total 3 mg) on all other days. 90 tablet 2   No current facility-administered medications on file prior to visit.    Allergies  Allergen Reactions  . Contrast Media [Iodinated Diagnostic Agents] Other (See Comments) and Hives    Per pt strong burning sensation starting in chest radiating outward Per pt strong burning sensation starting in chest radiating outward  . Clonidine Derivatives Other (See Comments)    Throat dry  . Statins Other (See Comments)    "bones hurt"  . Sulfa Antibiotics Diarrhea    Tremors   . Celebrex [Celecoxib] Rash  . Isosorbide Nitrate Itching and Rash  . Other Itching and Rash    Plastic and paper tape and heart monitor pads  . Tape Itching and Rash    Red Where applied and will spread   Social History   Social History  . Marital status: Married     Spouse name: N/A  . Number of children: N/A  . Years of education: N/A   Occupational History  . Not on file.   Social History  Main Topics  . Smoking status: Never Smoker  . Smokeless tobacco: Never Used  . Alcohol use No  . Drug use: No  . Sexual activity: Not Currently   Other Topics Concern  . Not on file   Social History Narrative  . No narrative on file      Review of Systems  All other systems reviewed and are negative.      Objective:   Physical Exam  Cardiovascular: Normal rate and normal heart sounds.   Pulmonary/Chest: Effort normal and breath sounds normal.  Musculoskeletal:       Arms: Skin: Skin is warm. No erythema.  Vitals reviewed.         Assessment & Plan:  Skin tear of right elbow without complication, initial encounter No sign of complication.  Wound cleaned, covered in polysporin, nonadherent gauze and wrapped in coban.  Recheck in 1 week.  Wound supplies were given to patient with directions.

## 2017-05-23 DIAGNOSIS — M19071 Primary osteoarthritis, right ankle and foot: Secondary | ICD-10-CM | POA: Diagnosis not present

## 2017-05-23 DIAGNOSIS — M25571 Pain in right ankle and joints of right foot: Secondary | ICD-10-CM | POA: Diagnosis not present

## 2017-05-23 DIAGNOSIS — M65871 Other synovitis and tenosynovitis, right ankle and foot: Secondary | ICD-10-CM | POA: Diagnosis not present

## 2017-05-23 DIAGNOSIS — M79671 Pain in right foot: Secondary | ICD-10-CM | POA: Diagnosis not present

## 2017-05-26 DIAGNOSIS — R198 Other specified symptoms and signs involving the digestive system and abdomen: Secondary | ICD-10-CM | POA: Diagnosis not present

## 2017-05-26 DIAGNOSIS — R1310 Dysphagia, unspecified: Secondary | ICD-10-CM | POA: Diagnosis not present

## 2017-05-26 DIAGNOSIS — K5792 Diverticulitis of intestine, part unspecified, without perforation or abscess without bleeding: Secondary | ICD-10-CM | POA: Diagnosis not present

## 2017-05-30 ENCOUNTER — Encounter: Payer: Self-pay | Admitting: Cardiology

## 2017-05-30 ENCOUNTER — Ambulatory Visit (INDEPENDENT_AMBULATORY_CARE_PROVIDER_SITE_OTHER): Payer: Medicare Other | Admitting: Cardiology

## 2017-05-30 VITALS — BP 138/48 | HR 50 | Ht 66.0 in | Wt 214.4 lb

## 2017-05-30 DIAGNOSIS — I5032 Chronic diastolic (congestive) heart failure: Secondary | ICD-10-CM | POA: Diagnosis not present

## 2017-05-30 DIAGNOSIS — I482 Chronic atrial fibrillation: Secondary | ICD-10-CM

## 2017-05-30 DIAGNOSIS — I10 Essential (primary) hypertension: Secondary | ICD-10-CM | POA: Diagnosis not present

## 2017-05-30 DIAGNOSIS — I2510 Atherosclerotic heart disease of native coronary artery without angina pectoris: Secondary | ICD-10-CM | POA: Diagnosis not present

## 2017-05-30 DIAGNOSIS — I77810 Thoracic aortic ectasia: Secondary | ICD-10-CM | POA: Diagnosis not present

## 2017-05-30 DIAGNOSIS — E785 Hyperlipidemia, unspecified: Secondary | ICD-10-CM

## 2017-05-30 DIAGNOSIS — I272 Pulmonary hypertension, unspecified: Secondary | ICD-10-CM

## 2017-05-30 DIAGNOSIS — I4821 Permanent atrial fibrillation: Secondary | ICD-10-CM

## 2017-05-30 LAB — BASIC METABOLIC PANEL
BUN / CREAT RATIO: 19 (ref 12–28)
BUN: 19 mg/dL (ref 8–27)
CHLORIDE: 97 mmol/L (ref 96–106)
CO2: 26 mmol/L (ref 20–29)
Calcium: 10.1 mg/dL (ref 8.7–10.3)
Creatinine, Ser: 1 mg/dL (ref 0.57–1.00)
GFR calc non Af Amer: 53 mL/min/{1.73_m2} — ABNORMAL LOW (ref >59)
GFR, EST AFRICAN AMERICAN: 61 mL/min/{1.73_m2} (ref >59)
GLUCOSE: 101 mg/dL — AB (ref 65–99)
POTASSIUM: 4.1 mmol/L (ref 3.5–5.2)
SODIUM: 139 mmol/L (ref 134–144)

## 2017-05-30 NOTE — Patient Instructions (Signed)
Medication Instructions:  Your physician recommends that you continue on your current medications as directed. Please refer to the Current Medication list given to you today.   Labwork: LABS TODAY: BMET  Testing/Procedures: None ordered  Follow-Up: Your physician wants you to follow-up in: 6 months with Dr. [name]. You will receive a reminder letter in the mail two months in advance. If you don't receive a letter, please call our office to schedule the follow-up appointment.   Any Other Special Instructions Will Be Listed Below (If Applicable).     If you need a refill on your cardiac medications before your next appointment, please call your pharmacy.

## 2017-05-30 NOTE — Progress Notes (Signed)
Cardiology Office Note:    Date:  05/30/2017   ID:  [name], DOB Sep 03, 1935, MRN [id number]  PCP:  Orlena Sheldon, PA-C  Cardiologist:  Fransico Him, MD   Referring MD: Orlena Sheldon, PA-C   Chief Complaint  Patient presents with  . Coronary Artery Disease  . Congestive Heart Failure  . Hypertension  . Cardiomyopathy    History of Present Illness:    [name] is a 82 y.o. female with a hx of CAD, HTN, chronic diastolic CHF NYHA class II-III, prior history of Takotsubo DCM (EF 35-40% 07/2015 and repeat echo 3/17 with normalization of LVF), dyslipidemia, moderate pulmonary HTN with chronic DOE secondary to pulmonary venous HTN from left sided HF followed in AHF clinic, mild AS, mildly dilated aortic root and permanent AF.   She is here today for followup and is doing well.  She was last seen by Dr. Aundra Dubin in July and was felt to have persistent volume overload despite high dose Lasix and was changed to Torsemide.  She is doing well today. She denies any chest pain or pressure, PND, orthopnea, LE edema, dizziness, palpitations or syncope.  She says that she really does not have any problems with SOB with her ADLs but if she goes outside to work she will notice some mild DOE.    Past Medical History:  Diagnosis Date  . Allergy    rhinitis  . Arthritis   . Breast cancer ([place]) 01/06/2012  . Cancer [facility])    right colon and left breast  . Chronic diastolic CHF (congestive heart failure) ([place])   . Colon cancer ([place]) 01/06/2012  . COPD (chronic obstructive pulmonary disease) ([place])   . Coronary artery disease 2006   nonobstructive with 20-30% ostial LAD and LM  . Dilated aortic root (HCC)    77mmHg  . Diverticulosis   . Edema extremities   . GERD (gastroesophageal reflux disease)   . Hernia   . Hiatal hernia    denies  . Hyperlipidemia   . Hypertension   . Mild aortic stenosis    echo 11/2015 but not noted on echo 06/2016  . Osteopenia   . Permanent atrial fibrillation (HCC)    chronic atrial fibrillation  . Pneumonia    hx child  . Pulmonary HTN ([place])    moderate to severe PASP 58mmHg echo 11/2015 - now 28mmHg by echo 06/2016  . Takotsubo syndrome 07/29/2015   a. EF 35-40% by echo; akinesis of mid-apical anteroseptal and apical myocardium.  EF now normalized on echo 11/2015    Past Surgical History:  Procedure Laterality Date  . APPENDECTOMY    . BREAST SURGERY     lumpectomy left  . CARDIAC CATHETERIZATION    . CARDIAC CATHETERIZATION N/A 07/28/2015   Procedure: Left Heart Cath and Coronary Angiography;  Surgeon: Peter M Martinique, MD;  Location: [place] [facility];  Service: Cardiovascular;  Laterality: N/A;  . CHOLECYSTECTOMY    . COLECTOMY     right side  . EXCISION OF ACCESSORY NIPPLE Bilateral 05/30/2013   Procedure: BILATERAL NIPPLE BIOPSY;  Surgeon: Merrie Roof, MD;  Location: [facility];  Service: General;  Laterality: Bilateral;  . EYE SURGERY Bilateral 12   cataracts  . MASTECTOMY PARTIAL / LUMPECTOMY  2008   left breast  . SPLIT NIGHT STUDY  02/02/2016        Current Medications: Current Meds  Medication Sig  . acetaminophen (TYLENOL) 325 MG tablet Take 650  mg by mouth every 6 (six) hours as needed for mild pain. Reported on 03/15/2016  . amLODipine (NORVASC) 5 MG tablet Take 1 tablet (5 mg total) by mouth daily.  . cetirizine (ZYRTEC) 10 MG tablet Take 10 mg by mouth daily as needed for allergies.  . clonazePAM (KLONOPIN) 0.5 MG tablet Take 1 tablet (0.5 mg total) by mouth 2 (two) times daily as needed for anxiety.  . cloNIDine (CATAPRES) 0.1 MG tablet TAKE ONE TABLET BY MOUTH TWICE DAILY  . guaiFENesin (MUCINEX) 600 MG 12 hr tablet Take 600 mg by mouth daily as needed for cough or to loosen phlegm.  Marland Kitchen HYDROcodone-acetaminophen (NORCO) 7.5-325 MG tablet Take 1 tablet by mouth every 6 (six) hours as needed for moderate pain.  Marland Kitchen losartan (COZAAR) 100 MG tablet Take 1 tablet (100 mg total) by mouth daily.  . metoprolol succinate (TOPROL-XL) 50 MG  24 hr tablet TAKE ONE TABLET BY MOUTH ONCE DAILY TAKE  WITH  OR  FOLLOWING  A  MEAL  . mirtazapine (REMERON) 30 MG tablet Take 1 tablet (30 mg total) by mouth at bedtime.  . montelukast (SINGULAIR) 10 MG tablet Take 1 tablet (10 mg total) by mouth at bedtime.  . nitroGLYCERIN (NITROSTAT) 0.4 MG SL tablet Place 1 tablet (0.4 mg total) under the tongue every 5 (five) minutes as needed for chest pain (x 3 pills).  Marland Kitchen omeprazole (PRILOSEC) 20 MG capsule Take 1 capsule (20 mg total) by mouth daily as needed (heartburn, acid reflux).  . ondansetron (ZOFRAN ODT) 4 MG disintegrating tablet Take 1 tablet (4 mg total) by mouth every 8 (eight) hours as needed for nausea or vomiting.  Marland Kitchen OVER THE COUNTER MEDICATION Place 1 drop into both eyes daily as needed (dry eyes). Eye drops  . spironolactone (ALDACTONE) 25 MG tablet TAKE ONE-HALF TABLET BY MOUTH ONCE DAILY  . torsemide (DEMADEX) 20 MG tablet Take 60 mg by mouth daily.   Marland Kitchen triamcinolone cream (KENALOG) 0.1 % Apply 1 application topically 2 (two) times daily.  Marland Kitchen warfarin (COUMADIN) 1 MG tablet Take 0.5 tablets (0.5 mg total) by mouth as directed. M-W-F-SA (Patient taking differently: Take 3 mg by mouth See admin instructions. Mon/Wed/Fri/Sat)  . warfarin (COUMADIN) 5 MG tablet Take 0.5 tablets (2.5 mg total) by mouth See admin instructions. Take half a tablet (2.5 mg) on Tuesdays, Thursdays, and Sundays. Take half a tablet with half a tablet of 1 mg (total 3 mg) on all other days.     Allergies:   Contrast media [iodinated diagnostic agents]; Clonidine derivatives; Statins; Sulfa antibiotics; Celebrex [celecoxib]; Isosorbide nitrate; Other; and Tape   Social History   Social History  . Marital status: Married    Spouse name: N/A  . Number of children: N/A  . Years of education: N/A   Social History Main Topics  . Smoking status: Never Smoker  . Smokeless tobacco: Never Used  . Alcohol use No  . Drug use: No  . Sexual activity: Not Currently    Other Topics Concern  . None   Social History Narrative  . None     Family History: The patient's family history includes Cancer in her sister and sister; Heart attack in her mother; Heart disease in her mother; Heart disease (age of onset: 20) in her brother; Hypertension in her father. There is no history of Stroke.  ROS:   Please see the history of present illness.    ROS  All other systems reviewed and negative.  EKGs/Labs/Other Studies Reviewed:    The following studies were reviewed today: Office notes from HF clinic  EKG:  EKG is not ordered today.   Recent Labs: 07/08/2016: TSH 2.20 11/29/2016: NT-Pro BNP 834 03/11/2017: B Natriuretic Peptide 163.0 03/24/2017: ALT 16; BUN 16; Creatinine, Ser 1.21; Hemoglobin 11.7; Platelets 292; Potassium 4.0; Sodium 138   Recent Lipid Panel    Component Value Date/Time   CHOL 157 11/29/2016 1107   TRIG 84 11/29/2016 1107   HDL 59 11/29/2016 1107   CHOLHDL 2.7 11/29/2016 1107   CHOLHDL 3.5 03/15/2016 1057   VLDL 23 03/15/2016 1057   LDLCALC 81 11/29/2016 1107    Physical Exam:    VS:  BP (!) 138/48   Pulse (!) 50   Ht 5\' 6"  (1.676 m)   Wt 214 lb 6.4 oz (97.3 kg)   BMI 34.61 kg/m     Wt Readings from Last 3 Encounters:  05/30/17 214 lb 6.4 oz (97.3 kg)  05/20/17 212 lb (96.2 kg)  05/04/17 212 lb (96.2 kg)     GEN:  Well nourished, well developed in no acute distress HEENT: Normal NECK: No JVD; No carotid bruits LYMPHATICS: No lymphadenopathy CARDIAC: RRR, no murmurs, rubs, gallops RESPIRATORY:  Clear to auscultation without rales, wheezing or rhonchi  ABDOMEN: Soft, non-tender, non-distended MUSCULOSKELETAL:  No edema; No deformity  SKIN: Warm and dry NEUROLOGIC:  Alert and oriented x 3 PSYCHIATRIC:  Normal affect   ASSESSMENT:    1. Coronary artery disease involving native coronary artery of native heart without angina pectoris   2. Essential hypertension   3. Permanent atrial fibrillation ([place])   4.  Dilated aortic root ([place])   5. Pulmonary HTN ([place])   6. Chronic diastolic CHF (congestive heart failure) ([place])   7. Dyslipidemia, goal LDL below 70    PLAN:    In order of problems listed above:  1.  ASCAD - nonobstructive by cath 2016.  No ASA due to warfarin.  Statin intolerant.   2.  HTN - her BP is well controlled on exam today.  She will continue on Clonidine 0.1mg  BID, Toprol XL 50mg  daily, aldactone 25mg  daily, Losartan 100mg  daily and amlodipine 5mg  daily.    3.  Permanent atrial fibrillation - her HR is well controlled on exam.  She will continue on Toprol XL 50mg  daily and warfarin.    4.  Dilated ascending aorta at 38mm by echo.  Repeat echo next month to assess for progression.  She will continue on ASA. She is statin intolerant.   5.  Pulmonary HTN - PASP 34mmg on echo a year ago. Will repeat echo next month.    6.  Chronic diastolic CHF NYHA class IIb- she recently was changed from Lasix to Torsemide for better absorption. She appears euvolemic on exam and weight is stable.  She is able to do her ADLs with no SOB.  She will continue on Demadex 80mg  daily.    7.  Hyperlipidemia with LDL goal < 70.  She is statin intolerant.  She refused the PCSK9 trial and cannot afford it on her own.     Medication Adjustments/Labs and Tests Ordered: Current medicines are reviewed at length with the patient today.  Concerns regarding medicines are outlined above.  No orders of the defined types were placed in this encounter.  No orders of the defined types were placed in this encounter.   Signed, Fransico Him, MD  05/30/2017 10:22 AM    [facility] 

## 2017-05-31 ENCOUNTER — Telehealth: Payer: Self-pay

## 2017-05-31 NOTE — Telephone Encounter (Signed)
spoke with patient about recent lab results. patient verbalized undertanding an dagreed to continue with current treatment plan.

## 2017-06-02 DIAGNOSIS — R928 Other abnormal and inconclusive findings on diagnostic imaging of breast: Secondary | ICD-10-CM | POA: Diagnosis not present

## 2017-06-02 LAB — HM MAMMOGRAPHY

## 2017-06-06 DIAGNOSIS — M25571 Pain in right ankle and joints of right foot: Secondary | ICD-10-CM | POA: Diagnosis not present

## 2017-06-14 ENCOUNTER — Encounter: Payer: Self-pay | Admitting: Family Medicine

## 2017-06-14 ENCOUNTER — Ambulatory Visit (HOSPITAL_COMMUNITY): Payer: Medicare Other | Attending: Internal Medicine

## 2017-06-14 ENCOUNTER — Encounter: Payer: Self-pay | Admitting: Cardiology

## 2017-06-14 ENCOUNTER — Other Ambulatory Visit: Payer: Self-pay

## 2017-06-14 DIAGNOSIS — E785 Hyperlipidemia, unspecified: Secondary | ICD-10-CM | POA: Insufficient documentation

## 2017-06-14 DIAGNOSIS — I350 Nonrheumatic aortic (valve) stenosis: Secondary | ICD-10-CM | POA: Insufficient documentation

## 2017-06-14 DIAGNOSIS — I4891 Unspecified atrial fibrillation: Secondary | ICD-10-CM | POA: Diagnosis not present

## 2017-06-14 DIAGNOSIS — I110 Hypertensive heart disease with heart failure: Secondary | ICD-10-CM | POA: Diagnosis not present

## 2017-06-14 DIAGNOSIS — I77810 Thoracic aortic ectasia: Secondary | ICD-10-CM

## 2017-06-14 DIAGNOSIS — I5032 Chronic diastolic (congestive) heart failure: Secondary | ICD-10-CM

## 2017-06-14 DIAGNOSIS — I2510 Atherosclerotic heart disease of native coronary artery without angina pectoris: Secondary | ICD-10-CM | POA: Insufficient documentation

## 2017-06-14 DIAGNOSIS — I082 Rheumatic disorders of both aortic and tricuspid valves: Secondary | ICD-10-CM | POA: Insufficient documentation

## 2017-06-14 DIAGNOSIS — I272 Pulmonary hypertension, unspecified: Secondary | ICD-10-CM

## 2017-06-15 DIAGNOSIS — K5792 Diverticulitis of intestine, part unspecified, without perforation or abscess without bleeding: Secondary | ICD-10-CM | POA: Diagnosis not present

## 2017-06-15 DIAGNOSIS — R1032 Left lower quadrant pain: Secondary | ICD-10-CM | POA: Diagnosis not present

## 2017-06-15 DIAGNOSIS — K591 Functional diarrhea: Secondary | ICD-10-CM | POA: Diagnosis not present

## 2017-06-15 DIAGNOSIS — R1310 Dysphagia, unspecified: Secondary | ICD-10-CM | POA: Diagnosis not present

## 2017-07-04 ENCOUNTER — Other Ambulatory Visit: Payer: Self-pay | Admitting: Physician Assistant

## 2017-07-04 DIAGNOSIS — R1310 Dysphagia, unspecified: Secondary | ICD-10-CM | POA: Diagnosis not present

## 2017-07-04 DIAGNOSIS — R1013 Epigastric pain: Secondary | ICD-10-CM | POA: Diagnosis not present

## 2017-07-04 DIAGNOSIS — R1030 Lower abdominal pain, unspecified: Secondary | ICD-10-CM | POA: Diagnosis not present

## 2017-07-06 ENCOUNTER — Encounter: Payer: Self-pay | Admitting: Physician Assistant

## 2017-07-06 ENCOUNTER — Ambulatory Visit
Admission: RE | Admit: 2017-07-06 | Discharge: 2017-07-06 | Disposition: A | Discharge status: Home / Self Care | Payer: Medicare Other | Source: Ambulatory Visit | Attending: Physician Assistant | Admitting: Physician Assistant

## 2017-07-06 ENCOUNTER — Encounter (HOSPITAL_COMMUNITY): Payer: Self-pay

## 2017-07-06 ENCOUNTER — Ambulatory Visit (INDEPENDENT_AMBULATORY_CARE_PROVIDER_SITE_OTHER): Payer: Medicare Other | Admitting: Physician Assistant

## 2017-07-06 VITALS — BP 160/70 | HR 68 | Temp 99.1°F | Resp 16 | Wt 219.2 lb

## 2017-07-06 DIAGNOSIS — K219 Gastro-esophageal reflux disease without esophagitis: Secondary | ICD-10-CM | POA: Diagnosis present

## 2017-07-06 DIAGNOSIS — Z9012 Acquired absence of left breast and nipple: Secondary | ICD-10-CM

## 2017-07-06 DIAGNOSIS — K651 Peritoneal abscess: Secondary | ICD-10-CM | POA: Diagnosis not present

## 2017-07-06 DIAGNOSIS — Z85038 Personal history of other malignant neoplasm of large intestine: Secondary | ICD-10-CM

## 2017-07-06 DIAGNOSIS — I5032 Chronic diastolic (congestive) heart failure: Secondary | ICD-10-CM | POA: Diagnosis present

## 2017-07-06 DIAGNOSIS — E871 Hypo-osmolality and hyponatremia: Secondary | ICD-10-CM | POA: Diagnosis not present

## 2017-07-06 DIAGNOSIS — I110 Hypertensive heart disease with heart failure: Secondary | ICD-10-CM | POA: Diagnosis not present

## 2017-07-06 DIAGNOSIS — I2510 Atherosclerotic heart disease of native coronary artery without angina pectoris: Secondary | ICD-10-CM | POA: Diagnosis present

## 2017-07-06 DIAGNOSIS — R1084 Generalized abdominal pain: Secondary | ICD-10-CM

## 2017-07-06 DIAGNOSIS — Z7901 Long term (current) use of anticoagulants: Secondary | ICD-10-CM

## 2017-07-06 DIAGNOSIS — K5780 Diverticulitis of intestine, part unspecified, with perforation and abscess without bleeding: Secondary | ICD-10-CM | POA: Diagnosis not present

## 2017-07-06 DIAGNOSIS — Z9842 Cataract extraction status, left eye: Secondary | ICD-10-CM

## 2017-07-06 DIAGNOSIS — Z888 Allergy status to other drugs, medicaments and biological substances status: Secondary | ICD-10-CM | POA: Diagnosis not present

## 2017-07-06 DIAGNOSIS — J9601 Acute respiratory failure with hypoxia: Secondary | ICD-10-CM | POA: Diagnosis present

## 2017-07-06 DIAGNOSIS — J439 Emphysema, unspecified: Secondary | ICD-10-CM | POA: Diagnosis not present

## 2017-07-06 DIAGNOSIS — I272 Pulmonary hypertension, unspecified: Secondary | ICD-10-CM | POA: Diagnosis present

## 2017-07-06 DIAGNOSIS — Z91048 Other nonmedicinal substance allergy status: Secondary | ICD-10-CM | POA: Diagnosis not present

## 2017-07-06 DIAGNOSIS — R3989 Other symptoms and signs involving the genitourinary system: Secondary | ICD-10-CM | POA: Diagnosis not present

## 2017-07-06 DIAGNOSIS — J449 Chronic obstructive pulmonary disease, unspecified: Secondary | ICD-10-CM | POA: Diagnosis not present

## 2017-07-06 DIAGNOSIS — Z9841 Cataract extraction status, right eye: Secondary | ICD-10-CM

## 2017-07-06 DIAGNOSIS — Z9049 Acquired absence of other specified parts of digestive tract: Secondary | ICD-10-CM

## 2017-07-06 DIAGNOSIS — K5730 Diverticulosis of large intestine without perforation or abscess without bleeding: Secondary | ICD-10-CM | POA: Diagnosis not present

## 2017-07-06 DIAGNOSIS — Z91041 Radiographic dye allergy status: Secondary | ICD-10-CM | POA: Diagnosis not present

## 2017-07-06 DIAGNOSIS — Z9221 Personal history of antineoplastic chemotherapy: Secondary | ICD-10-CM

## 2017-07-06 DIAGNOSIS — E785 Hyperlipidemia, unspecified: Secondary | ICD-10-CM | POA: Diagnosis present

## 2017-07-06 DIAGNOSIS — Z882 Allergy status to sulfonamides status: Secondary | ICD-10-CM

## 2017-07-06 DIAGNOSIS — K5720 Diverticulitis of large intestine with perforation and abscess without bleeding: Secondary | ICD-10-CM | POA: Diagnosis not present

## 2017-07-06 DIAGNOSIS — Z853 Personal history of malignant neoplasm of breast: Secondary | ICD-10-CM

## 2017-07-06 DIAGNOSIS — F329 Major depressive disorder, single episode, unspecified: Secondary | ICD-10-CM | POA: Diagnosis present

## 2017-07-06 DIAGNOSIS — Z800 Family history of malignant neoplasm of digestive organs: Secondary | ICD-10-CM

## 2017-07-06 DIAGNOSIS — R1032 Left lower quadrant pain: Secondary | ICD-10-CM | POA: Diagnosis not present

## 2017-07-06 DIAGNOSIS — I482 Chronic atrial fibrillation: Secondary | ICD-10-CM | POA: Diagnosis not present

## 2017-07-06 DIAGNOSIS — F419 Anxiety disorder, unspecified: Secondary | ICD-10-CM | POA: Diagnosis present

## 2017-07-06 DIAGNOSIS — R109 Unspecified abdominal pain: Secondary | ICD-10-CM | POA: Diagnosis not present

## 2017-07-06 DIAGNOSIS — I10 Essential (primary) hypertension: Secondary | ICD-10-CM | POA: Diagnosis not present

## 2017-07-06 DIAGNOSIS — K449 Diaphragmatic hernia without obstruction or gangrene: Secondary | ICD-10-CM | POA: Diagnosis present

## 2017-07-06 DIAGNOSIS — E860 Dehydration: Secondary | ICD-10-CM | POA: Diagnosis present

## 2017-07-06 DIAGNOSIS — N739 Female pelvic inflammatory disease, unspecified: Secondary | ICD-10-CM | POA: Diagnosis not present

## 2017-07-06 LAB — URINALYSIS, ROUTINE W REFLEX MICROSCOPIC
BACTERIA UA: NONE SEEN /HPF
Bilirubin Urine: NEGATIVE
GLUCOSE, UA: NEGATIVE
Ketones, ur: NEGATIVE
Leukocytes, UA: NEGATIVE
Nitrite: NEGATIVE
SPECIFIC GRAVITY, URINE: 1.01 (ref 1.001–1.03)
WBC UA: NONE SEEN /HPF (ref 0–5)
pH: 6 (ref 5.0–8.0)

## 2017-07-06 LAB — CBC
HCT: 34.5 % — ABNORMAL LOW (ref 35.0–45.0)
Hemoglobin: 11.4 g/dL — ABNORMAL LOW (ref 11.7–15.5)
MCH: 30.6 pg (ref 27.0–33.0)
MCHC: 33 g/dL (ref 32.0–36.0)
MCV: 92.5 fL (ref 80.0–100.0)
PLATELETS: 226 10*3/uL (ref 140–400)
RBC: 3.73 10*6/uL — AB (ref 3.80–5.10)
RDW: 14.2 % (ref 11.0–15.0)
WBC: 13.1 10*3/uL — AB (ref 3.8–10.8)

## 2017-07-06 LAB — COMPREHENSIVE METABOLIC PANEL
ALBUMIN: 3.6 g/dL (ref 3.5–5.0)
ALT: 15 U/L (ref 14–54)
ANION GAP: 12 (ref 5–15)
AST: 22 U/L (ref 15–41)
Alkaline Phosphatase: 79 U/L (ref 38–126)
BUN: 16 mg/dL (ref 6–20)
CHLORIDE: 91 mmol/L — AB (ref 101–111)
CO2: 27 mmol/L (ref 22–32)
Calcium: 9.2 mg/dL (ref 8.9–10.3)
Creatinine, Ser: 1.06 mg/dL — ABNORMAL HIGH (ref 0.44–1.00)
GFR calc Af Amer: 56 mL/min — ABNORMAL LOW (ref >60)
GFR calc non Af Amer: 48 mL/min — ABNORMAL LOW (ref >60)
GLUCOSE: 112 mg/dL — AB (ref 65–99)
POTASSIUM: 3.9 mmol/L (ref 3.5–5.1)
SODIUM: 130 mmol/L — AB (ref 135–145)
Total Bilirubin: 1.4 mg/dL — ABNORMAL HIGH (ref 0.3–1.2)
Total Protein: 7.9 g/dL (ref 6.5–8.1)

## 2017-07-06 LAB — CBC WITH DIFFERENTIAL/PLATELET
Basophils Absolute: 0 10*3/uL (ref 0.0–0.1)
Basophils Relative: 0 %
EOS ABS: 0.1 10*3/uL (ref 0.0–0.7)
Eosinophils Relative: 0 %
HCT: 34.2 % — ABNORMAL LOW (ref 36.0–46.0)
HEMOGLOBIN: 11.3 g/dL — AB (ref 12.0–15.0)
LYMPHS ABS: 2.2 10*3/uL (ref 0.7–4.0)
LYMPHS PCT: 17 %
MCH: 29.9 pg (ref 26.0–34.0)
MCHC: 33 g/dL (ref 30.0–36.0)
MCV: 90.5 fL (ref 78.0–100.0)
MONOS PCT: 12 %
Monocytes Absolute: 1.5 10*3/uL — ABNORMAL HIGH (ref 0.1–1.0)
NEUTROS PCT: 71 %
Neutro Abs: 9.5 10*3/uL — ABNORMAL HIGH (ref 1.7–7.7)
Platelets: 245 10*3/uL (ref 150–400)
RBC: 3.78 MIL/uL — AB (ref 3.87–5.11)
RDW: 14.3 % (ref 11.5–15.5)
WBC: 13.3 10*3/uL — AB (ref 4.0–10.5)

## 2017-07-06 LAB — PT WITH INR/FINGERSTICK
INR FINGERSTICK: 2.9 ratio — AB
PT, fingerstick: 34.3 s — ABNORMAL HIGH (ref 10.5–13.1)

## 2017-07-06 LAB — MICROSCOPIC MESSAGE

## 2017-07-06 LAB — I-STAT CG4 LACTIC ACID, ED: Lactic Acid, Venous: 1.36 mmol/L (ref 0.5–1.9)

## 2017-07-06 NOTE — ED Notes (Signed)
Pt reevaluated in triage.  Pt continues to c/o abd. Pain.  No vomiting at this time.

## 2017-07-06 NOTE — Progress Notes (Signed)
Patient ID: [name] MRN: [id number], DOB: 04/28/1935, 82 y.o. Date of Encounter: @DATE @  Chief Complaint:  Chief Complaint  Patient presents with  . Dysuria  . dark brown urine    HPI: 82 y.o. year old female  presents with the following.   She reports that she is due to check her PT/INR to monitor her Coumadin. States that she is taking her Coumadin as directed. Has had no skipped doses. No new medications recently. Having no bleeding. No bleeding from the gums. No hematochezia or known melena.  She reports that she has been having discomfort in her abdomen "started Monday evening ". Points her hand across abdomen-- across the lower abdomen and across the upper abdomen. Says she "doesn't know if it's gas or bloated or something."  Says she's been feeling this way since Monday evening which was 07/04/17. States that she has noticed no change in bowel habits. Has had no diarrhea and does not think she is constipated. Has had decreased appetite yesterday and today. Has had no vomiting. Says "all across there is sore "and rubs her hand across abdomen--- across the lower and upper abdomen.  I reviewed that in July of this year she went to the ED --had CT---diverticulitis. I asked if the symptoms are similar. Says this is different because it is hurting all over and points towards her upper abdomen in addition to the lower abdomen. States that her last bowel movement was this morning and was normal.  Has had no fevers or chills.   Past Medical History:  Diagnosis Date  . Allergy    rhinitis  . Aortic stenosis    mild by echo 06/2017  . Arthritis   . Breast cancer ([place]) 01/06/2012  . Cancer [facility])    right colon and left breast  . Chronic diastolic CHF (congestive heart failure) ([place])   . Colon cancer ([place]) 01/06/2012  . COPD (chronic obstructive pulmonary disease) ([place])   . Coronary artery disease 2006   nonobstructive with 20-30% ostial LAD and LM  . Dilated aortic root ([place])    71mmHg by echo 06/2017  . Diverticulosis   . Edema extremities   . GERD (gastroesophageal reflux disease)   . Hernia   . Hiatal hernia    denies  . Hyperlipidemia   . Hypertension   . Mild aortic stenosis    echo 11/2015 but not noted on echo 06/2016  . Osteopenia   . Permanent atrial fibrillation (HCC)    chronic atrial fibrillation  . Pneumonia    hx child  . Pulmonary HTN ([place])    moderate to severe PASP 42mmHg echo 11/2015 - now 64mmHg by echo 06/2017  . Takotsubo syndrome 07/29/2015   a. EF 35-40% by echo; akinesis of mid-apical anteroseptal and apical myocardium.  EF now normalized on echo 11/2015     Home Meds: Outpatient Medications Prior to Visit  Medication Sig Dispense Refill  . acetaminophen (TYLENOL) 325 MG tablet Take 650 mg by mouth every 6 (six) hours as needed for mild pain. Reported on 03/15/2016    . amLODipine (NORVASC) 5 MG tablet Take 1 tablet (5 mg total) by mouth daily. 30 tablet 3  . cetirizine (ZYRTEC) 10 MG tablet Take 10 mg by mouth daily as needed for allergies.    . clonazePAM (KLONOPIN) 0.5 MG tablet Take 1 tablet (0.5 mg total) by mouth 2 (two) times daily as needed for anxiety. 60 tablet 1  . cloNIDine (CATAPRES) 0.1 MG  tablet TAKE ONE TABLET BY MOUTH TWICE DAILY 60 tablet 11  . guaiFENesin (MUCINEX) 600 MG 12 hr tablet Take 600 mg by mouth daily as needed for cough or to loosen phlegm.    Marland Kitchen HYDROcodone-acetaminophen (NORCO) 7.5-325 MG tablet Take 1 tablet by mouth every 6 (six) hours as needed for moderate pain. 30 tablet 0  . losartan (COZAAR) 100 MG tablet Take 1 tablet (100 mg total) by mouth daily. 30 tablet 3  . metoprolol succinate (TOPROL-XL) 50 MG 24 hr tablet TAKE ONE TABLET BY MOUTH ONCE DAILY TAKE  WITH  OR  FOLLOWING  A  MEAL 90 tablet 3  . mirtazapine (REMERON) 30 MG tablet Take 1 tablet (30 mg total) by mouth at bedtime. 30 tablet 1  . montelukast (SINGULAIR) 10 MG tablet Take 1 tablet (10 mg total) by mouth at bedtime. 30 tablet 3  .  nitroGLYCERIN (NITROSTAT) 0.4 MG SL tablet Place 1 tablet (0.4 mg total) under the tongue every 5 (five) minutes as needed for chest pain (x 3 pills). 30 tablet 2  . omeprazole (PRILOSEC) 20 MG capsule Take 1 capsule (20 mg total) by mouth daily as needed (heartburn, acid reflux). 90 capsule 3  . ondansetron (ZOFRAN ODT) 4 MG disintegrating tablet Take 1 tablet (4 mg total) by mouth every 8 (eight) hours as needed for nausea or vomiting. 8 tablet 0  . OVER THE COUNTER MEDICATION Place 1 drop into both eyes daily as needed (dry eyes). Eye drops    . spironolactone (ALDACTONE) 25 MG tablet TAKE ONE-HALF TABLET BY MOUTH ONCE DAILY 45 tablet 3  . torsemide (DEMADEX) 20 MG tablet Take 60 mg by mouth daily.     Marland Kitchen triamcinolone cream (KENALOG) 0.1 % Apply 1 application topically 2 (two) times daily. 30 g 0  . warfarin (COUMADIN) 1 MG tablet Take 0.5 tablets (0.5 mg total) by mouth as directed. M-W-F-SA (Patient taking differently: Take 3 mg by mouth See admin instructions. Mon/Wed/Fri/Sat) 90 tablet 2  . warfarin (COUMADIN) 5 MG tablet Take 0.5 tablets (2.5 mg total) by mouth See admin instructions. Take half a tablet (2.5 mg) on Tuesdays, Thursdays, and Sundays. Take half a tablet with half a tablet of 1 mg (total 3 mg) on all other days. 90 tablet 2   No facility-administered medications prior to visit.     Allergies:  Allergies  Allergen Reactions  . Contrast Media [Iodinated Diagnostic Agents] Other (See Comments) and Hives    Per pt strong burning sensation starting in chest radiating outward Per pt strong burning sensation starting in chest radiating outward  . Clonidine Derivatives Other (See Comments)    Throat dry  . Statins Other (See Comments)    "bones hurt"  . Sulfa Antibiotics Diarrhea    Tremors   . Celebrex [Celecoxib] Rash  . Isosorbide Nitrate Itching and Rash  . Other Itching and Rash    Plastic and paper tape and heart monitor pads  . Tape Itching and Rash    Red Where  applied and will spread    Social History   Social History  . Marital status: Married    Spouse name: N/A  . Number of children: N/A  . Years of education: N/A   Occupational History  . Not on file.   Social History Main Topics  . Smoking status: Never Smoker  . Smokeless tobacco: Never Used  . Alcohol use No  . Drug use: No  . Sexual activity: Not Currently  Other Topics Concern  . Not on file   Social History Narrative  . No narrative on file    Family History  Problem Relation Age of Onset  . Heart disease Mother   . Heart attack Mother   . Cancer Sister        stomach and colon  . Heart disease Brother 9  . Hypertension Father   . Cancer Sister   . Stroke Neg Hx      Review of Systems:  See HPI for pertinent ROS. All other ROS negative.    Physical Exam: Blood pressure (!) 160/70, pulse 68, temperature 99.1 F (37.3 C), temperature source Oral, resp. rate 16, weight 99.4 kg (219 lb 3.2 oz), SpO2 95 %., Body mass index is 35.38 kg/m. General: Obese WF. Appears in no acute distress. Neck: Supple. No thyromegaly. No lymphadenopathy. Lungs: Clear bilaterally to auscultation without wheezes, rales, or rhonchi. Breathing is unlabored. Heart: Irregular. Abdomen: Soft,  non-distended with normoactive bowel sounds. No hepatomegaly. No rebound/guarding. No obvious abdominal masses. She has diffuse tenderness of her entire abdomen. Everywhere that I palpate she says is tender. This includes right upper quadrant epigastrium left upper quadrant left lower quadrant right lower quadrant. There is no focal area of increased tenderness compared to the remainder. Musculoskeletal:  Strength and tone normal for age. Extremities/Skin: Warm and dry. Neuro: Alert and oriented X 3. Moves all extremities spontaneously. Gait is normal. CNII-XII grossly in tact. Psych:  Responds to questions appropriately with a normal affect.     ASSESSMENT AND PLAN:  82 y.o. year old female  with  1. Diffuse abdominal pain CBC run here and the results obtained and reviewed with pt.  Will follow up CMET as well. - COMPLETE METABOLIC PANEL WITH GFR - CBC  WBC today is elevated at 13.1. Reviewed that she had ED visit 03/2017--at that time CT showed diverticulitis and at that time WBC was 7.5.  Given elevted WBC and her symptoms--obtain CT Abdomen/Pelvis. Maudie Mercury was able to get this approved, scheduled---pt to go directly to CT now. I will f/u getting CT report later today.   2. Abnormal urine color  - Urinalysis, Routine w reflex microscopic  3. Long term current use of anticoagulant INR is therapeutic at 2.9. Continue current dosing of Coumadin. Recheck INR 4 weeks. - PT with INR/Fingerstick   Signed, Olean Ree Pangburn, [place], [facility] 07/06/2017 3:09 PM

## 2017-07-06 NOTE — ED Triage Notes (Signed)
Patient sent to ED after CT today showing pelvic abscess possibly related to diverticulitis. Patient reports generalized abdominal pain x 3 days with nausea. No vomiting, no diarrhea, no fever

## 2017-07-07 ENCOUNTER — Inpatient Hospital Stay (HOSPITAL_COMMUNITY)
Admission: EM | Admit: 2017-07-07 | Discharge: 2017-07-16 | DRG: 391 | Disposition: A | Discharge status: Home Health | Payer: Medicare Other | Attending: Internal Medicine | Admitting: Internal Medicine

## 2017-07-07 ENCOUNTER — Other Ambulatory Visit: Payer: Medicare Other

## 2017-07-07 DIAGNOSIS — Z888 Allergy status to other drugs, medicaments and biological substances status: Secondary | ICD-10-CM | POA: Diagnosis not present

## 2017-07-07 DIAGNOSIS — Z882 Allergy status to sulfonamides status: Secondary | ICD-10-CM | POA: Diagnosis not present

## 2017-07-07 DIAGNOSIS — K5730 Diverticulosis of large intestine without perforation or abscess without bleeding: Secondary | ICD-10-CM | POA: Diagnosis not present

## 2017-07-07 DIAGNOSIS — J449 Chronic obstructive pulmonary disease, unspecified: Secondary | ICD-10-CM | POA: Diagnosis present

## 2017-07-07 DIAGNOSIS — I110 Hypertensive heart disease with heart failure: Secondary | ICD-10-CM | POA: Diagnosis present

## 2017-07-07 DIAGNOSIS — N739 Female pelvic inflammatory disease, unspecified: Secondary | ICD-10-CM | POA: Diagnosis not present

## 2017-07-07 DIAGNOSIS — J439 Emphysema, unspecified: Secondary | ICD-10-CM | POA: Diagnosis not present

## 2017-07-07 DIAGNOSIS — Z9841 Cataract extraction status, right eye: Secondary | ICD-10-CM | POA: Diagnosis not present

## 2017-07-07 DIAGNOSIS — F419 Anxiety disorder, unspecified: Secondary | ICD-10-CM | POA: Diagnosis present

## 2017-07-07 DIAGNOSIS — I5032 Chronic diastolic (congestive) heart failure: Secondary | ICD-10-CM | POA: Diagnosis present

## 2017-07-07 DIAGNOSIS — K651 Peritoneal abscess: Secondary | ICD-10-CM | POA: Diagnosis not present

## 2017-07-07 DIAGNOSIS — R109 Unspecified abdominal pain: Secondary | ICD-10-CM | POA: Diagnosis not present

## 2017-07-07 DIAGNOSIS — I482 Chronic atrial fibrillation: Secondary | ICD-10-CM | POA: Diagnosis present

## 2017-07-07 DIAGNOSIS — I10 Essential (primary) hypertension: Secondary | ICD-10-CM | POA: Diagnosis not present

## 2017-07-07 DIAGNOSIS — Z853 Personal history of malignant neoplasm of breast: Secondary | ICD-10-CM | POA: Diagnosis not present

## 2017-07-07 DIAGNOSIS — F329 Major depressive disorder, single episode, unspecified: Secondary | ICD-10-CM | POA: Diagnosis present

## 2017-07-07 DIAGNOSIS — Z91048 Other nonmedicinal substance allergy status: Secondary | ICD-10-CM | POA: Diagnosis not present

## 2017-07-07 DIAGNOSIS — Z7901 Long term (current) use of anticoagulants: Secondary | ICD-10-CM | POA: Diagnosis not present

## 2017-07-07 DIAGNOSIS — Z9842 Cataract extraction status, left eye: Secondary | ICD-10-CM | POA: Diagnosis not present

## 2017-07-07 DIAGNOSIS — E785 Hyperlipidemia, unspecified: Secondary | ICD-10-CM | POA: Diagnosis present

## 2017-07-07 DIAGNOSIS — E871 Hypo-osmolality and hyponatremia: Secondary | ICD-10-CM | POA: Diagnosis present

## 2017-07-07 DIAGNOSIS — Z800 Family history of malignant neoplasm of digestive organs: Secondary | ICD-10-CM | POA: Diagnosis not present

## 2017-07-07 DIAGNOSIS — I2510 Atherosclerotic heart disease of native coronary artery without angina pectoris: Secondary | ICD-10-CM | POA: Diagnosis present

## 2017-07-07 DIAGNOSIS — Z9012 Acquired absence of left breast and nipple: Secondary | ICD-10-CM | POA: Diagnosis not present

## 2017-07-07 DIAGNOSIS — K219 Gastro-esophageal reflux disease without esophagitis: Secondary | ICD-10-CM | POA: Diagnosis present

## 2017-07-07 DIAGNOSIS — K5720 Diverticulitis of large intestine with perforation and abscess without bleeding: Secondary | ICD-10-CM | POA: Diagnosis present

## 2017-07-07 DIAGNOSIS — Z91041 Radiographic dye allergy status: Secondary | ICD-10-CM | POA: Diagnosis not present

## 2017-07-07 DIAGNOSIS — Z85038 Personal history of other malignant neoplasm of large intestine: Secondary | ICD-10-CM | POA: Diagnosis not present

## 2017-07-07 DIAGNOSIS — K5780 Diverticulitis of intestine, part unspecified, with perforation and abscess without bleeding: Secondary | ICD-10-CM | POA: Diagnosis not present

## 2017-07-07 DIAGNOSIS — Z9049 Acquired absence of other specified parts of digestive tract: Secondary | ICD-10-CM | POA: Diagnosis not present

## 2017-07-07 DIAGNOSIS — R1032 Left lower quadrant pain: Secondary | ICD-10-CM | POA: Diagnosis not present

## 2017-07-07 DIAGNOSIS — I4821 Permanent atrial fibrillation: Secondary | ICD-10-CM | POA: Diagnosis present

## 2017-07-07 DIAGNOSIS — J9601 Acute respiratory failure with hypoxia: Secondary | ICD-10-CM | POA: Diagnosis present

## 2017-07-07 LAB — LACTIC ACID, PLASMA: Lactic Acid, Venous: 1.1 mmol/L (ref 0.5–1.9)

## 2017-07-07 LAB — BASIC METABOLIC PANEL
ANION GAP: 13 (ref 5–15)
BUN: 16 mg/dL (ref 6–20)
CHLORIDE: 93 mmol/L — AB (ref 101–111)
CO2: 25 mmol/L (ref 22–32)
Calcium: 9.2 mg/dL (ref 8.9–10.3)
Creatinine, Ser: 1.01 mg/dL — ABNORMAL HIGH (ref 0.44–1.00)
GFR calc Af Amer: 59 mL/min — ABNORMAL LOW (ref >60)
GFR calc non Af Amer: 51 mL/min — ABNORMAL LOW (ref >60)
GLUCOSE: 111 mg/dL — AB (ref 65–99)
POTASSIUM: 3.3 mmol/L — AB (ref 3.5–5.1)
Sodium: 131 mmol/L — ABNORMAL LOW (ref 135–145)

## 2017-07-07 LAB — CBG MONITORING, ED: GLUCOSE-CAPILLARY: 80 mg/dL (ref 65–99)

## 2017-07-07 LAB — COMPLETE METABOLIC PANEL WITH GFR
AG Ratio: 1.1 (calc) (ref 1.0–2.5)
ALBUMIN MSPROF: 3.7 g/dL (ref 3.6–5.1)
ALT: 11 U/L (ref 6–29)
AST: 19 U/L (ref 10–35)
Alkaline phosphatase (APISO): 76 U/L (ref 33–130)
BILIRUBIN TOTAL: 1.2 mg/dL (ref 0.2–1.2)
BUN / CREAT RATIO: 17 (calc) (ref 6–22)
BUN: 18 mg/dL (ref 7–25)
CALCIUM: 8.8 mg/dL (ref 8.6–10.4)
CO2: 30 mmol/L (ref 20–32)
CREATININE: 1.04 mg/dL — AB (ref 0.60–0.88)
Chloride: 92 mmol/L — ABNORMAL LOW (ref 98–110)
GFR, EST AFRICAN AMERICAN: 59 mL/min/{1.73_m2} — AB (ref >=60)
GFR, EST NON AFRICAN AMERICAN: 51 mL/min/{1.73_m2} — AB (ref >=60)
GLUCOSE: 113 mg/dL — AB (ref 65–99)
Globulin: 3.3 g/dL (calc) (ref 1.9–3.7)
Potassium: 3.8 mmol/L (ref 3.5–5.3)
Sodium: 131 mmol/L — ABNORMAL LOW (ref 135–146)
TOTAL PROTEIN: 7 g/dL (ref 6.1–8.1)

## 2017-07-07 LAB — URINALYSIS, ROUTINE W REFLEX MICROSCOPIC
Bacteria, UA: NONE SEEN
Bilirubin Urine: NEGATIVE
GLUCOSE, UA: NEGATIVE mg/dL
KETONES UR: NEGATIVE mg/dL
LEUKOCYTES UA: NEGATIVE
Nitrite: NEGATIVE
PH: 5 (ref 5.0–8.0)
Protein, ur: 30 mg/dL — AB
SPECIFIC GRAVITY, URINE: 1.009 (ref 1.005–1.030)

## 2017-07-07 LAB — BRAIN NATRIURETIC PEPTIDE: B Natriuretic Peptide: 128.3 pg/mL — ABNORMAL HIGH (ref 0.0–100.0)

## 2017-07-07 LAB — CBC
HEMATOCRIT: 32.5 % — AB (ref 36.0–46.0)
HEMOGLOBIN: 10.7 g/dL — AB (ref 12.0–15.0)
MCH: 29.6 pg (ref 26.0–34.0)
MCHC: 32.9 g/dL (ref 30.0–36.0)
MCV: 89.8 fL (ref 78.0–100.0)
Platelets: 237 10*3/uL (ref 150–400)
RBC: 3.62 MIL/uL — AB (ref 3.87–5.11)
RDW: 13.9 % (ref 11.5–15.5)
WBC: 13.3 10*3/uL — ABNORMAL HIGH (ref 4.0–10.5)

## 2017-07-07 LAB — PROTIME-INR
INR: 2.1
PROTHROMBIN TIME: 23.4 s — AB (ref 11.4–15.2)

## 2017-07-07 LAB — APTT: APTT: 56 s — AB (ref 24–36)

## 2017-07-07 LAB — PROCALCITONIN

## 2017-07-07 MED ORDER — HYDROCODONE-ACETAMINOPHEN 7.5-325 MG PO TABS
1.0000 | ORAL_TABLET | Freq: Four times a day (QID) | ORAL | Status: DC | PRN
  Administered 2017-07-07 – 2017-07-16 (×27): 1 via ORAL
  Filled 2017-07-07 (×27): qty 1

## 2017-07-07 MED ORDER — PANTOPRAZOLE SODIUM 40 MG PO TBEC
40.0000 mg | DELAYED_RELEASE_TABLET | Freq: Every day | ORAL | Status: DC
  Administered 2017-07-07 – 2017-07-16 (×10): 40 mg via ORAL
  Filled 2017-07-07 (×10): qty 1

## 2017-07-07 MED ORDER — PIPERACILLIN-TAZOBACTAM 3.375 G IVPB
3.3750 g | Freq: Three times a day (TID) | INTRAVENOUS | Status: DC
  Administered 2017-07-07 – 2017-07-15 (×24): 3.375 g via INTRAVENOUS
  Filled 2017-07-07 (×29): qty 50

## 2017-07-07 MED ORDER — TRIAMCINOLONE ACETONIDE 0.1 % EX CREA
1.0000 "application " | TOPICAL_CREAM | Freq: Two times a day (BID) | CUTANEOUS | Status: DC
  Administered 2017-07-08 – 2017-07-16 (×11): 1 via TOPICAL
  Filled 2017-07-07 (×2): qty 15

## 2017-07-07 MED ORDER — LORATADINE 10 MG PO TABS
10.0000 mg | ORAL_TABLET | Freq: Every day | ORAL | Status: DC
  Administered 2017-07-08 – 2017-07-16 (×9): 10 mg via ORAL
  Filled 2017-07-07 (×10): qty 1

## 2017-07-07 MED ORDER — PIPERACILLIN-TAZOBACTAM 3.375 G IVPB: 3.3750 g | Freq: Three times a day (TID) | INTRAVENOUS | Status: DC

## 2017-07-07 MED ORDER — PIPERACILLIN-TAZOBACTAM 3.375 G IVPB 30 MIN
3.3750 g | Freq: Once | INTRAVENOUS | Status: AC
  Administered 2017-07-07: 3.375 g via INTRAVENOUS
  Filled 2017-07-07: qty 50

## 2017-07-07 MED ORDER — ALBUTEROL SULFATE (2.5 MG/3ML) 0.083% IN NEBU
2.5000 mg | INHALATION_SOLUTION | RESPIRATORY_TRACT | Status: DC | PRN
  Administered 2017-07-11: 2.5 mg via RESPIRATORY_TRACT
  Filled 2017-07-07: qty 3

## 2017-07-07 MED ORDER — ZOLPIDEM TARTRATE 5 MG PO TABS
5.0000 mg | ORAL_TABLET | Freq: Every evening | ORAL | Status: DC | PRN
  Administered 2017-07-07 – 2017-07-08 (×2): 5 mg via ORAL
  Filled 2017-07-07 (×2): qty 1

## 2017-07-07 MED ORDER — SODIUM CHLORIDE 0.9 % IV SOLN
INTRAVENOUS | Status: DC
  Administered 2017-07-07: 03:00:00 via INTRAVENOUS

## 2017-07-07 MED ORDER — CLONIDINE HCL 0.1 MG PO TABS
0.1000 mg | ORAL_TABLET | Freq: Two times a day (BID) | ORAL | Status: DC
  Administered 2017-07-07 – 2017-07-16 (×19): 0.1 mg via ORAL
  Filled 2017-07-07 (×20): qty 1

## 2017-07-07 MED ORDER — GUAIFENESIN ER 600 MG PO TB12: 600.0000 mg | ORAL_TABLET | Freq: Every day | ORAL | Status: DC | PRN

## 2017-07-07 MED ORDER — NITROGLYCERIN 0.4 MG SL SUBL: 0.4000 mg | SUBLINGUAL_TABLET | SUBLINGUAL | Status: DC | PRN

## 2017-07-07 MED ORDER — ONDANSETRON HCL 4 MG/2ML IJ SOLN
4.0000 mg | Freq: Four times a day (QID) | INTRAMUSCULAR | Status: DC | PRN
  Administered 2017-07-09: 4 mg via INTRAVENOUS
  Filled 2017-07-07 (×2): qty 2

## 2017-07-07 MED ORDER — ONDANSETRON HCL 4 MG PO TABS
4.0000 mg | ORAL_TABLET | Freq: Four times a day (QID) | ORAL | Status: DC | PRN
Start: 2017-07-07 — End: 2017-07-16

## 2017-07-07 MED ORDER — ONDANSETRON HCL 4 MG/2ML IJ SOLN
4.0000 mg | Freq: Once | INTRAMUSCULAR | Status: AC
  Administered 2017-07-07: 4 mg via INTRAVENOUS
  Filled 2017-07-07: qty 2

## 2017-07-07 MED ORDER — CLONAZEPAM 0.5 MG PO TABS
0.5000 mg | ORAL_TABLET | Freq: Two times a day (BID) | ORAL | Status: DC | PRN
  Administered 2017-07-14 – 2017-07-15 (×2): 0.5 mg via ORAL
  Filled 2017-07-07 (×2): qty 1

## 2017-07-07 MED ORDER — LOSARTAN POTASSIUM 50 MG PO TABS
100.0000 mg | ORAL_TABLET | Freq: Every day | ORAL | Status: DC
  Administered 2017-07-07 – 2017-07-16 (×10): 100 mg via ORAL
  Filled 2017-07-07 (×11): qty 2

## 2017-07-07 MED ORDER — AMLODIPINE BESYLATE 5 MG PO TABS
5.0000 mg | ORAL_TABLET | Freq: Every day | ORAL | Status: DC
  Administered 2017-07-07 – 2017-07-08 (×2): 5 mg via ORAL
  Filled 2017-07-07 (×2): qty 1

## 2017-07-07 MED ORDER — MIRTAZAPINE 15 MG PO TABS
30.0000 mg | ORAL_TABLET | Freq: Every day | ORAL | Status: DC
  Administered 2017-07-07 – 2017-07-15 (×9): 30 mg via ORAL
  Filled 2017-07-07 (×8): qty 2
  Filled 2017-07-07: qty 1
  Filled 2017-07-07: qty 2

## 2017-07-07 MED ORDER — METOPROLOL SUCCINATE ER 50 MG PO TB24
50.0000 mg | ORAL_TABLET | Freq: Every day | ORAL | Status: DC
  Administered 2017-07-07 – 2017-07-16 (×10): 50 mg via ORAL
  Filled 2017-07-07 (×11): qty 1

## 2017-07-07 MED ORDER — MONTELUKAST SODIUM 10 MG PO TABS
10.0000 mg | ORAL_TABLET | Freq: Every day | ORAL | Status: DC
  Administered 2017-07-07 – 2017-07-15 (×9): 10 mg via ORAL
  Filled 2017-07-07 (×10): qty 1

## 2017-07-07 MED ORDER — ACETAMINOPHEN 325 MG PO TABS
650.0000 mg | ORAL_TABLET | Freq: Four times a day (QID) | ORAL | Status: DC | PRN
  Filled 2017-07-07: qty 2

## 2017-07-07 MED ORDER — FENTANYL CITRATE (PF) 100 MCG/2ML IJ SOLN
50.0000 ug | Freq: Once | INTRAMUSCULAR | Status: AC
  Administered 2017-07-07: 50 ug via INTRAVENOUS
  Filled 2017-07-07: qty 2

## 2017-07-07 MED ORDER — HYDRALAZINE HCL 20 MG/ML IJ SOLN: 5.0000 mg | INTRAMUSCULAR | Status: DC | PRN

## 2017-07-07 NOTE — ED Notes (Signed)
Attempted report x1. 

## 2017-07-07 NOTE — Progress Notes (Signed)
PROGRESS NOTE    [name]  ZOX:[id number] DOB: 14-Dec-1934 DOA: 07/07/2017 PCP: Orlena Sheldon, PA-C  Outpatient Specialists:     Brief Narrative:  Patient is an 82 y.o. female with medical history significant of hypertension, hyperlipidemia, COPD, GERD, depression, slightly, pulmonary hypertension, Takotsubo syndrome, atrial fibrillation on Coumadin, CAD, aortic root dilation, dCHF, colon cancer (s/p pf partial resection and radiation therapy), breast cancer (s/p of left lumpectomy), who presents with abdominal pain. Patient was admitted with worsening abdominal pain. According to the patient, she has abdominal pain for over 1 year. CT Scan of abdomen/pelvis done without IV contrast (Patient has contrast allergy) revealed "4.9 x 4.4 cm left pelvic abscess with the epicenter in the region of the left adnexum. I suspect this is complicated diverticulitis with an abscess involving the left ovary and likely extending into the endometrial cavity. 2. Sigmoid diverticulosis".  Patient's WBC was 13.3, lactic acid 1.36, negative urinalysis and Procalcitonin is less than 0.1.  Consulted Interventional Radiology for possible drainage of the abscess.   Assessment & Plan:   Principal Problem:   Pelvic abscess in female Active Problems:   GERD (gastroesophageal reflux disease)   Hypertension   COPD (chronic obstructive pulmonary disease) (HCC)   Coronary artery disease   Permanent atrial fibrillation (HCC)   Chronic anticoagulation   Chronic diastolic CHF (congestive heart failure) (HCC)  Continue Antibiotics Consult IR for possible drainage.    Pelvic abscess in female: Patient has leukocytosis.   - IV Zosyn   -Hold her Coumadin -When necessary Zofran for nausea, and morphine and Norco for pain -IV fluids: Normal saline 75 mL per hour -Blood culture - Called l IR for possible.  Atrial Fibrillation: CHA2DS2-VASc Score is 6, needs oral anticoagulation. Patient is on Coumadin at home.  Heart rate is well controlled. -hold coumadin for possible procedure  -continue metoprolol  Chronic diastolic CHF: 2-D echo on 06/14/17 showed EF of 60-65%.  Compensated.  GERD: -Protonix  HTN: Continue to optimize -Continue amlodipine, clonidine, Cozaar, metoprolol -IV hydralazine when necessary  COPD (chronic obstructive pulmonary disease) ([place]): stable -prn albuterol nebs  Coronary artery disease: Stable. - No CP -continue metoprolol -When necessary nitroglycerin  Depression and anxiety: Stable, no suicidal or homicidal ideations. -Continue home medications: When necessary Klonopin, Remeron   DVT ppx: SCD Code Status: Full code Family Communication: None at bed side.   Disposition Plan:  Anticipate discharge back to previous home environment Consults called:  none Admission status: Inpatient/tele    Subjective: No new complaints. No fever or chills.  Objective: Vitals:   07/07/17 0800 07/07/17 1000 07/07/17 1027 07/07/17 1029  BP: (!) 145/58 (!) 151/65 (!) 150/59 (!) 150/59  Pulse: 64 64 65   Resp: 19 13 15    Temp:      TempSrc:      SpO2: 94% 94% 94%    No intake or output data in the 24 hours ending 07/07/17 1114 There were no vitals filed for this visit.  Examination:  General exam: Appears calm and comfortable  Respiratory system: Clear to auscultation. Respiratory effort normal. Cardiovascular system: S1 & S2 heard. Gastrointestinal system: Abdomen is obese, non tender, soft.   Central nervous system: Alert and oriented. No focal neurological deficits. Extremities: Symmetric 5 x 5 power. No edema.   Data Reviewed: I have personally reviewed following labs and imaging studies  CBC:  Recent Labs Lab 07/06/17 1510 07/06/17 1855 07/07/17 0244  WBC 13.1* 13.3* 13.3*  NEUTROABS  --  9.5*  --  HGB 11.4* 11.3* 10.7*  HCT 34.5* 34.2* 32.5*  MCV 92.5 90.5 89.8  PLT 226 245 749   Basic Metabolic Panel:  Recent Labs Lab 07/06/17 1510  07/06/17 1855 07/07/17 0244  NA 131* 130* 131*  K 3.8 3.9 3.3*  CL 92* 91* 93*  CO2 30 27 25   GLUCOSE 113* 112* 111*  BUN 18 16 16   CREATININE 1.04* 1.06* 1.01*  CALCIUM 8.8 9.2 9.2   GFR: Estimated Creatinine Clearance: 52.8 mL/min (A) (by C-G formula based on SCr of 1.01 mg/dL (H)). Liver Function Tests:  Recent Labs Lab 07/06/17 1510 07/06/17 1855  AST 19 22  ALT 11 15  ALKPHOS  --  79  BILITOT 1.2 1.4*  PROT 7.0 7.9  ALBUMIN  --  3.6   No results for input(s): LIPASE, AMYLASE in the last 168 hours. No results for input(s): AMMONIA in the last 168 hours. Coagulation Profile:  Recent Labs Lab 07/06/17 1115 07/07/17 0236  INR 2.9* 2.10   Cardiac Enzymes: No results for input(s): CKTOTAL, CKMB, CKMBINDEX, TROPONINI in the last 168 hours. BNP (last 3 results)  Recent Labs  11/29/16 1107  PROBNP 834*   HbA1C: No results for input(s): HGBA1C in the last 72 hours. CBG: No results for input(s): GLUCAP in the last 168 hours. Lipid Profile: No results for input(s): CHOL, HDL, LDLCALC, TRIG, CHOLHDL, LDLDIRECT in the last 72 hours. Thyroid Function Tests: No results for input(s): TSH, T4TOTAL, FREET4, T3FREE, THYROIDAB in the last 72 hours. Anemia Panel: No results for input(s): VITAMINB12, FOLATE, FERRITIN, TIBC, IRON, RETICCTPCT in the last 72 hours. Urine analysis:    Component Value Date/Time   COLORURINE YELLOW 07/07/2017 [place] 07/07/2017 0641   LABSPEC 1.009 07/07/2017 0641   PHURINE 5.0 07/07/2017 0641   GLUCOSEU NEGATIVE 07/07/2017 0641   HGBUR MODERATE (A) 07/07/2017 0641   BILIRUBINUR NEGATIVE 07/07/2017 0641   KETONESUR NEGATIVE 07/07/2017 0641   PROTEINUR 30 (A) 07/07/2017 0641   UROBILINOGEN 0.2 11/04/2013 1321   NITRITE NEGATIVE 07/07/2017 0641   LEUKOCYTESUR NEGATIVE 07/07/2017 0641   Sepsis Labs: @LABRCNTIP (procalcitonin:4,lacticidven:4)  )No results found for this or any previous visit (from the past 240 hour(s)).        Radiology Studies: Ct Abdomen Pelvis Wo Contrast  Result Date: 07/06/2017 CLINICAL DATA:  Left lower quadrant abdominal pain for 3 days. Low-grade fever. EXAM: CT ABDOMEN AND PELVIS WITHOUT CONTRAST TECHNIQUE: Multidetector CT imaging of the abdomen and pelvis was performed following the standard protocol without IV contrast. COMPARISON:  CT scan 04/27/2017 FINDINGS: Lower chest: The lung bases are clear of acute process. No worrisome pulmonary lesions. The heart is normal in size for age. No pericardial effusion. Hepatobiliary: No worrisome hepatic lesions. Small calcified granulomas are noted. The gallbladder is surgically absent. No common bile duct dilatation. Pancreas: Diffuse fatty change involving the pancreas but no acute inflammation, mass or ductal dilatation. Spleen: Normal size.  Numerous calcified granulomas. Adrenals/Urinary Tract: The adrenal glands and kidneys are unremarkable. No renal, ureteral or bladder calculi or mass. Stomach/Bowel: The stomach, duodenum and small bowel are grossly normal. No acute inflammatory process, mass lesions or obstructive findings. Prior history of right colectomy with ileal colonic anastomosis at the transverse colon. No complicating features. There is mild to moderate inflammation involving the sigmoid colon and significant diverticulosis. Adjacent to the sigmoid colon and closely associated with the patient's left ovary is a 4.9 x 4.4 cm abscess. There is also a thickened fluid filled appearing endometrium  which is new since August. Findings most likely due to complicated diverticulitis with abscess involving left ovary and extending into the left fallopian tube and into the endometrial cavity. A small amount of air is also noted in the vagina. I suspect the patient has a vaginal discharge. Vascular/Lymphatic: Small scattered retroperitoneal lymph nodes and a stable peripancreatic node on image number 32. No mesenteric or retroperitoneal mass.  Moderate atherosclerotic calcifications involving the aorta and branch vessels. Reproductive: Uterus and ovaries as discussed above. The right ovary appears normal. Other: No free pelvic fluid.  No inguinal mass or adenopathy. Musculoskeletal: No significant bony findings. Osteoporosis is noted. IMPRESSION: 1. 4.9 x 4.4 cm left pelvic abscess with the epicenter in the region of the left adnexum. I suspect this is complicated diverticulitis with an abscess involving the left ovary and likely extending into the endometrial cavity. 2. Sigmoid diverticulosis. 3. Remote surgical changes involving the colon without complicating features. These results will be called to the ordering clinician or representative by the Radiologist Assistant, and communication documented in the PACS or zVision Dashboard. Electronically Signed   By: Marijo Sanes M.D.   On: 07/06/2017 16:52        Scheduled Meds: . amLODipine  5 mg Oral Daily  . cloNIDine  0.1 mg Oral BID  . loratadine  10 mg Oral Daily  . losartan  100 mg Oral Daily  . metoprolol succinate  50 mg Oral Daily  . mirtazapine  30 mg Oral QHS  . montelukast  10 mg Oral QHS  . pantoprazole  40 mg Oral Daily  . triamcinolone cream  1 application Topical BID   Continuous Infusions: . sodium chloride 75 mL/hr at 07/07/17 0249  . piperacillin-tazobactam (ZOSYN)  IV 3.375 g (07/07/17 1030)     LOS: 0 days    Time spent: 25 Minutes.    Dana Allan, MD  [facility] Pager #: [phone] 7PM-7AM contact night coverage as above

## 2017-07-07 NOTE — H&P (Signed)
History and Physical    [name] GUR:[id number] DOB: [name] 09, 1936 DOA: 07/07/2017  Referring MD/NP/PA:   PCP: Orlena Sheldon, PA-C   Patient coming from:  The patient is coming from home.  At baseline, pt is independent for most of ADL.  Chief Complaint: Abdominal pain  HPI: [name] is a 82 y.o. female with medical history significant of hypertension, hyperlipidemia, COPD, GERD, depression, slightly, pulmonary hypertension, Takotsubo syndrome, atrial fibrillation on Coumadin, CAD, aortic root dilation, dCHF, colon cancer (s/p pf partial resection and radiation therapy), breast cancer (s/p of left lumpectomy), who presents with abdominal pain.  Patient states that she has been having intermittent abdominal pain for about a six-month, which has worsened in the past 3 days. Her abdominal pain is located in mainly in the lower abdomen, but also involving whole abdomen, which is constant, 8 out of 10 in severity, sharp, nonradiating. It is not associated with nausea, vomiting. She has chronic diarrhea, but not today. Denies fever or chills. Patient does not have chest pain, shortness breath, cough. She states that she has a little burning on urination, but denies dysuria or urgency. Pt was seen by PCP and had CT scan which showed diverticulitis with complex adnexal abscess.  She has had a colonoscopy by Dr. Cristina Gong approximately 5 years ago.  ED Course: pt was found to have WBC 13.3, lactic acid 1.36, negative urinalysis, electrolytes and renal function okay, temperature 99.1, no tachycardia, no tachypnea, oxygen saturation 95% on room air.  Review of Systems:    General: no fevers, chills, no body weight gain, has poor appetite, has fatigue HEENT: no blurry vision, hearing changes or sore throat Respiratory: no dyspnea, coughing, wheezing CV: no chest pain, no palpitations GI: no nausea, vomiting, has abdominal pain, diarrhea, no constipation GU: no dysuria, has burning on urination, no   increased urinary frequency, hematuria  Ext: has trace leg edema Neuro: no unilateral weakness, numbness, or tingling, no vision change or hearing loss Skin: no rash, no skin tear. MSK: No muscle spasm, no deformity, no limitation of range of movement in spin Heme: No easy bruising.  Travel history: No recent long distant travel.  Allergy:  Allergies  Allergen Reactions  . Contrast Media [Iodinated Diagnostic Agents] Other (See Comments) and Hives    Per pt strong burning sensation starting in chest radiating outward Per pt strong burning sensation starting in chest radiating outward  . Clonidine Derivatives Other (See Comments)    Throat dry  . Statins Other (See Comments)    "bones hurt"  . Sulfa Antibiotics Diarrhea    Tremors   . Celebrex [Celecoxib] Rash  . Isosorbide Nitrate Itching and Rash  . Other Itching and Rash    Plastic and paper tape and heart monitor pads  . Tape Itching and Rash    Red Where applied and will spread    Past Medical History:  Diagnosis Date  . Allergy    rhinitis  . Aortic stenosis    mild by echo 06/2017  . Arthritis   . Breast cancer ([place]) 01/06/2012  . Cancer [facility])    right colon and left breast  . Chronic diastolic CHF (congestive heart failure) ([place])   . Colon cancer ([place]) 01/06/2012  . COPD (chronic obstructive pulmonary disease) ([place])   . Coronary artery disease 2006   nonobstructive with 20-30% ostial LAD and LM  . Dilated aortic root ([place])    53mmHg by echo 06/2017  . Diverticulosis   .  Edema extremities   . GERD (gastroesophageal reflux disease)   . Hernia   . Hiatal hernia    denies  . Hyperlipidemia   . Hypertension   . Mild aortic stenosis    echo 11/2015 but not noted on echo 06/2016  . Osteopenia   . Permanent atrial fibrillation (HCC)    chronic atrial fibrillation  . Pneumonia    hx child  . Pulmonary HTN ([place])    moderate to severe PASP 52mmHg echo 11/2015 - now 99mmHg by echo 06/2017  . Takotsubo syndrome  07/29/2015   a. EF 35-40% by echo; akinesis of mid-apical anteroseptal and apical myocardium.  EF now normalized on echo 11/2015    Past Surgical History:  Procedure Laterality Date  . APPENDECTOMY    . BREAST SURGERY     lumpectomy left  . CARDIAC CATHETERIZATION    . CARDIAC CATHETERIZATION N/A 07/28/2015   Procedure: Left Heart Cath and Coronary Angiography;  Surgeon: Peter M Martinique, MD;  Location: [facility];  Service: Cardiovascular;  Laterality: N/A;  . CHOLECYSTECTOMY    . COLECTOMY     right side  . EXCISION OF ACCESSORY NIPPLE Bilateral 05/30/2013   Procedure: BILATERAL NIPPLE BIOPSY;  Surgeon: Merrie Roof, MD;  Location: [facility];  Service: General;  Laterality: Bilateral;  . EYE SURGERY Bilateral 12   cataracts  . MASTECTOMY PARTIAL / LUMPECTOMY  2008   left breast  . SPLIT NIGHT STUDY  02/02/2016        Social History:  reports that she has never smoked. She has never used smokeless tobacco. She reports that she does not drink alcohol or use drugs.  Family History:  Family History  Problem Relation Age of Onset  . Heart disease Mother   . Heart attack Mother   . Cancer Sister        stomach and colon  . Heart disease Brother 67  . Hypertension Father   . Cancer Sister   . Stroke Neg Hx      Prior to Admission medications   Medication Sig Start Date End Date Taking? Authorizing Provider  acetaminophen (TYLENOL) 325 MG tablet Take 650 mg by mouth every 6 (six) hours as needed for mild pain. Reported on 03/15/2016    [provider]  amLODipine (NORVASC) 5 MG tablet Take 1 tablet (5 mg total) by mouth daily. 03/25/17   Clegg, Amy D, NP  cetirizine (ZYRTEC) 10 MG tablet Take 10 mg by mouth daily as needed for allergies.    [provider]  clonazePAM (KLONOPIN) 0.5 MG tablet Take 1 tablet (0.5 mg total) by mouth 2 (two) times daily as needed for anxiety. 04/04/17   Dena Billet B, PA-C  cloNIDine (CATAPRES) 0.1 MG tablet TAKE ONE TABLET BY  MOUTH TWICE DAILY 12/14/16   [name], MD  guaiFENesin (MUCINEX) 600 MG 12 hr tablet Take 600 mg by mouth daily as needed for cough or to loosen phlegm.    [provider]  HYDROcodone-acetaminophen (NORCO) 7.5-325 MG tablet Take 1 tablet by mouth every 6 (six) hours as needed for moderate pain. 04/25/17   Orlena Sheldon, PA-C  losartan (COZAAR) 100 MG tablet Take 1 tablet (100 mg total) by mouth daily. 03/26/17   Clegg, Amy D, NP  metoprolol succinate (TOPROL-XL) 50 MG 24 hr tablet TAKE ONE TABLET BY MOUTH ONCE DAILY TAKE  WITH  OR  FOLLOWING  A  MEAL 10/26/16   Orlena Sheldon,  PA-C  mirtazapine (REMERON) 30 MG tablet Take 1 tablet (30 mg total) by mouth at bedtime. 05/04/17   Dena Billet B, PA-C  montelukast (SINGULAIR) 10 MG tablet Take 1 tablet (10 mg total) by mouth at bedtime. 04/28/16   Orlena Sheldon, PA-C  nitroGLYCERIN (NITROSTAT) 0.4 MG SL tablet Place 1 tablet (0.4 mg total) under the tongue every 5 (five) minutes as needed for chest pain (x 3 pills). 10/23/15   Orlena Sheldon, PA-C  omeprazole (PRILOSEC) 20 MG capsule Take 1 capsule (20 mg total) by mouth daily as needed (heartburn, acid reflux). 09/25/15   Dena Billet B, PA-C  ondansetron (ZOFRAN ODT) 4 MG disintegrating tablet Take 1 tablet (4 mg total) by mouth every 8 (eight) hours as needed for nausea or vomiting. 02/26/17   Sherwood Gambler, MD  OVER THE COUNTER MEDICATION Place 1 drop into both eyes daily as needed (dry eyes). Eye drops    [provider]  spironolactone (ALDACTONE) 25 MG tablet TAKE ONE-HALF TABLET BY MOUTH ONCE DAILY 12/28/16   [name], MD  torsemide (DEMADEX) 20 MG tablet Take 60 mg by mouth daily.     [provider]  triamcinolone cream (KENALOG) 0.1 % Apply 1 application topically 2 (two) times daily. 12/31/16   Alycia Rossetti, MD  warfarin (COUMADIN) 1 MG tablet Take 0.5 tablets (0.5 mg total) by mouth as directed. M-W-F-SA Patient taking differently: Take 3 mg by mouth See admin  instructions. Mon/Wed/Fri/Sat 07/08/16   Orlena Sheldon, PA-C  warfarin (COUMADIN) 5 MG tablet Take 0.5 tablets (2.5 mg total) by mouth See admin instructions. Take half a tablet (2.5 mg) on Tuesdays, Thursdays, and Sundays. Take half a tablet with half a tablet of 1 mg (total 3 mg) on all other days. 07/08/16   Orlena Sheldon, PA-C    Physical Exam: Vitals:   07/07/17 0230 07/07/17 0245 07/07/17 0345 07/07/17 0400  BP: (!) 156/73 (!) 151/69 (!) 153/58   Pulse: 73 69 66 64  Resp:   (!) 23 (!) 25  Temp:    98.6 F (37 C)  TempSrc:    Oral  SpO2: 94% 96% 93% 94%   General: Not in acute distress HEENT:       Eyes: PERRL, EOMI, no scleral icterus.       ENT: No discharge from the ears and nose, no pharynx injection, no tonsillar enlargement.        Neck: No JVD, no bruit, no mass felt. Heme: No neck lymph node enlargement. Cardiac: S1/S2, RRR, No murmurs, No gallops or rubs. Respiratory:  No rales, wheezing, rhonchi or rubs. GI: Soft, nondistended, has diffused tenderness, no rebound pain, no organomegaly, BS present. GU: No hematuria Ext: has trace pitting leg edema bilaterally. 2+DP/PT pulse bilaterally. Musculoskeletal: No joint deformities, No joint redness or warmth, no limitation of ROM in spin. Skin: No rashes.  Neuro: Alert, oriented X3, cranial nerves II-XII grossly intact, moves all extremities normally. Psych: Patient is not psychotic, no suicidal or hemocidal ideation.  Labs on Admission: I have personally reviewed following labs and imaging studies  CBC:  Recent Labs Lab 07/06/17 1510 07/06/17 1855 07/07/17 0244  WBC 13.1* 13.3* 13.3*  NEUTROABS  --  9.5*  --   HGB 11.4* 11.3* 10.7*  HCT 34.5* 34.2* 32.5*  MCV 92.5 90.5 89.8  PLT 226 245 323   Basic Metabolic Panel:  Recent Labs Lab 07/06/17 1510 07/06/17 1855 07/07/17 0244  NA 131* 130* 131*  K 3.8 3.9 3.3*  CL 92* 91* 93*  CO2 30 27 25   GLUCOSE 113* 112* 111*  BUN 18 16 16   CREATININE 1.04* 1.06*  1.01*  CALCIUM 8.8 9.2 9.2   GFR: Estimated Creatinine Clearance: 52.8 mL/min (A) (by C-G formula based on SCr of 1.01 mg/dL (H)). Liver Function Tests:  Recent Labs Lab 07/06/17 1510 07/06/17 1855  AST 19 22  ALT 11 15  ALKPHOS  --  79  BILITOT 1.2 1.4*  PROT 7.0 7.9  ALBUMIN  --  3.6   No results for input(s): LIPASE, AMYLASE in the last 168 hours. No results for input(s): AMMONIA in the last 168 hours. Coagulation Profile:  Recent Labs Lab 07/06/17 1115 07/07/17 0236  INR 2.9* 2.10   Cardiac Enzymes: No results for input(s): CKTOTAL, CKMB, CKMBINDEX, TROPONINI in the last 168 hours. BNP (last 3 results)  Recent Labs  11/29/16 1107  PROBNP 834*   HbA1C: No results for input(s): HGBA1C in the last 72 hours. CBG: No results for input(s): GLUCAP in the last 168 hours. Lipid Profile: No results for input(s): CHOL, HDL, LDLCALC, TRIG, CHOLHDL, LDLDIRECT in the last 72 hours. Thyroid Function Tests: No results for input(s): TSH, T4TOTAL, FREET4, T3FREE, THYROIDAB in the last 72 hours. Anemia Panel: No results for input(s): VITAMINB12, FOLATE, FERRITIN, TIBC, IRON, RETICCTPCT in the last 72 hours. Urine analysis:    Component Value Date/Time   COLORURINE YELLOW 07/06/2017 [place] 07/06/2017 1452   LABSPEC 1.010 07/06/2017 1452   PHURINE 6.0 07/06/2017 1452   GLUCOSEU NEGATIVE 07/06/2017 1452   HGBUR TRACE (A) 07/06/2017 1452   BILIRUBINUR NEGATIVE 03/24/2017 1345   [place] 07/06/2017 1452   PROTEINUR 2+ (A) 07/06/2017 1452   UROBILINOGEN 0.2 11/04/2013 1321   NITRITE NEGATIVE 07/06/2017 1452   LEUKOCYTESUR NEGATIVE 07/06/2017 1452   Sepsis Labs: @LABRCNTIP (procalcitonin:4,lacticidven:4) )No results found for this or any previous visit (from the past 240 hour(s)).   Radiological Exams on Admission: Ct Abdomen Pelvis Wo Contrast  Result Date: 07/06/2017 CLINICAL DATA:  Left lower quadrant abdominal pain for 3 days. Low-grade  fever. EXAM: CT ABDOMEN AND PELVIS WITHOUT CONTRAST TECHNIQUE: Multidetector CT imaging of the abdomen and pelvis was performed following the standard protocol without IV contrast. COMPARISON:  CT scan 04/27/2017 FINDINGS: Lower chest: The lung bases are clear of acute process. No worrisome pulmonary lesions. The heart is normal in size for age. No pericardial effusion. Hepatobiliary: No worrisome hepatic lesions. Small calcified granulomas are noted. The gallbladder is surgically absent. No common bile duct dilatation. Pancreas: Diffuse fatty change involving the pancreas but no acute inflammation, mass or ductal dilatation. Spleen: Normal size.  Numerous calcified granulomas. Adrenals/Urinary Tract: The adrenal glands and kidneys are unremarkable. No renal, ureteral or bladder calculi or mass. Stomach/Bowel: The stomach, duodenum and small bowel are grossly normal. No acute inflammatory process, mass lesions or obstructive findings. Prior history of right colectomy with ileal colonic anastomosis at the transverse colon. No complicating features. There is mild to moderate inflammation involving the sigmoid colon and significant diverticulosis. Adjacent to the sigmoid colon and closely associated with the patient's left ovary is a 4.9 x 4.4 cm abscess. There is also a thickened fluid filled appearing endometrium which is new since August. Findings most likely due to complicated diverticulitis with abscess involving left ovary and extending into the left fallopian tube and into the endometrial cavity. A small amount of air is also noted in the vagina. I suspect the  patient has a vaginal discharge. Vascular/Lymphatic: Small scattered retroperitoneal lymph nodes and a stable peripancreatic node on image number 32. No mesenteric or retroperitoneal mass. Moderate atherosclerotic calcifications involving the aorta and branch vessels. Reproductive: Uterus and ovaries as discussed above. The right ovary appears normal.  Other: No free pelvic fluid.  No inguinal mass or adenopathy. Musculoskeletal: No significant bony findings. Osteoporosis is noted. IMPRESSION: 1. 4.9 x 4.4 cm left pelvic abscess with the epicenter in the region of the left adnexum. I suspect this is complicated diverticulitis with an abscess involving the left ovary and likely extending into the endometrial cavity. 2. Sigmoid diverticulosis. 3. Remote surgical changes involving the colon without complicating features. These results will be called to the ordering clinician or representative by the Radiologist Assistant, and communication documented in the PACS or zVision Dashboard. Electronically Signed   By: Marijo Sanes M.D.   On: 07/06/2017 16:52     EKG: Independently reviewed. Atrial fibrillation, QTC 445, anteroseptal infarction pattern, nonspecific T-wave change.    Assessment/Plan Principal Problem:   Pelvic abscess in female Active Problems:   GERD (gastroesophageal reflux disease)   Hypertension   COPD (chronic obstructive pulmonary disease) (HCC)   Coronary artery disease   Permanent atrial fibrillation (HCC)   Chronic anticoagulation   Chronic diastolic CHF (congestive heart failure) (HCC)   Pelvic abscess in female: Patient has leukocytosis, but does not meets criteria for sepsis. Lactic acid is normal. Hemodynamically stable. -Admit to telemetry bed as inpatient - IV Zosyn was started in ED, will continue -Hold her Coumadin -When necessary Zofran for nausea, and morphine and Norco for pain -IV fluids: Normal saline 75 mL per hour -Blood culture -Please call IR for possible drain in AM.  Atrial Fibrillation: CHA2DS2-VASc Score is 6, needs oral anticoagulation. Patient is on Coumadin at home. Heart rate is well controlled. -hold coumadin for possible procedure  -continue metoprolol  Chronic diastolic CHF: 2-D echo on 06/14/17 showed EF of 60-65%. Patient does not have respiratory distress. She has mild leg edema, no  JVD. CHF seems to be compensated. -Hold diuretics, torsemide and spironolactone while patient is nothing by mouth -Continue metoprolol -Check BNP  GERD: -Protonix  HTN: -Continue amlodipine, clonidine, Cozaar, metoprolol -IV hydralazine when necessary  COPD (chronic obstructive pulmonary disease) ([place]): stable -prn albuterol nebs -Continue Singulair, when necessary Mucinex  Coronary artery disease: no CP -continue metoprolol -When necessary nitroglycerin  Depression and anxiety: Stable, no suicidal or homicidal ideations. -Continue home medications: When necessary Klonopin, Remeron   DVT ppx: SCD Code Status: Full code Family Communication: None at bed side.   Disposition Plan:  Anticipate discharge back to previous home environment Consults called:  none Admission status: Inpatient/tele     Date of Service 07/07/2017    Ivor Costa [facility] Pager [phone]  If 7PM-7AM, please contact night-coverage [email or web address] Password [email or web address] 07/07/2017, 4:17 AM

## 2017-07-07 NOTE — Consult Note (Signed)
Chief Complaint: Patient was seen in consultation today for  Chief Complaint  Patient presents with  . pelvic abscess/diverticilitis    Referring Physician(s): Dr. Dana Allan  Supervising Physician: Marybelle Killings  Patient Status: [facility] - [facility]  History of Present Illness: [name] is a 82 y.o. female with past medical history of arhtitis, breast cancer, CHF, colon cancer, COPD, CAD, and diverticulosis who presented to [facility] ED with abdominal pain.  Patient states she has been having abdominal pain intermittently for the past 2 months, but that the pain became so severe overnight she called her PCP.  They recommended CT Abdomen/Pelvis which showed: 1. 4.9 x 4.4 cm left pelvic abscess with the epicenter in the region of the left adnexum. I suspect this is complicated diverticulitis with an abscess involving the left ovary and likely extending into the endometrial cavity. 2. Sigmoid diverticulosis. 3. Remote surgical changes involving the colon without complicating Features.  Patient was referred to the ED. IR consulted for aspiration and drainage of abdominal/pevlic fluid collection.  Dr. Barbie Banner has reviewed case and approved patient for procedure.   She does take coumadin at home and last took Tuesday night.   Past Medical History:  Diagnosis Date  . Allergy    rhinitis  . Aortic stenosis    mild by echo 06/2017  . Arthritis   . Breast cancer ([place]) 01/06/2012  . Cancer [facility])    right colon and left breast  . Chronic diastolic CHF (congestive heart failure) ([place])   . Colon cancer ([place]) 01/06/2012  . COPD (chronic obstructive pulmonary disease) ([place])   . Coronary artery disease 2006   nonobstructive with 20-30% ostial LAD and LM  . Dilated aortic root ([place])    37mmHg by echo 06/2017  . Diverticulosis   . Edema extremities   . GERD (gastroesophageal reflux disease)   . Hernia   . Hiatal hernia    denies  . Hyperlipidemia   . Hypertension   . Mild aortic stenosis     echo 11/2015 but not noted on echo 06/2016  . Osteopenia   . Permanent atrial fibrillation (HCC)    chronic atrial fibrillation  . Pneumonia    hx child  . Pulmonary HTN ([place])    moderate to severe PASP 52mmHg echo 11/2015 - now 17mmHg by echo 06/2017  . Takotsubo syndrome 07/29/2015   a. EF 35-40% by echo; akinesis of mid-apical anteroseptal and apical myocardium.  EF now normalized on echo 11/2015    Past Surgical History:  Procedure Laterality Date  . APPENDECTOMY    . BREAST SURGERY     lumpectomy left  . CARDIAC CATHETERIZATION    . CARDIAC CATHETERIZATION N/A 07/28/2015   Procedure: Left Heart Cath and Coronary Angiography;  Surgeon: Peter M Martinique, MD;  Location: [facility];  Service: Cardiovascular;  Laterality: N/A;  . CHOLECYSTECTOMY    . COLECTOMY     right side  . EXCISION OF ACCESSORY NIPPLE Bilateral 05/30/2013   Procedure: BILATERAL NIPPLE BIOPSY;  Surgeon: Merrie Roof, MD;  Location: [facility];  Service: General;  Laterality: Bilateral;  . EYE SURGERY Bilateral 12   cataracts  . MASTECTOMY PARTIAL / LUMPECTOMY  2008   left breast  . SPLIT NIGHT STUDY  02/02/2016        Allergies: Contrast media [iodinated diagnostic agents]; Clonidine derivatives; Statins; Sulfa antibiotics; Celebrex [celecoxib]; Isosorbide nitrate; Other; and Tape  Medications: Prior to Admission medications   Medication Sig Start  Date End Date Taking? Authorizing Provider  acetaminophen (TYLENOL) 325 MG tablet Take 650 mg by mouth every 6 (six) hours as needed for mild pain. Reported on 03/15/2016   Yes [provider]  amLODipine (NORVASC) 5 MG tablet Take 1 tablet (5 mg total) by mouth daily. 03/25/17  Yes Clegg, Amy D, NP  cetirizine (ZYRTEC) 10 MG tablet Take 10 mg by mouth daily as needed for allergies.   Yes [provider]  clonazePAM (KLONOPIN) 0.5 MG tablet Take 1 tablet (0.5 mg total) by mouth 2 (two) times daily as needed for anxiety. 04/04/17  Yes Dena Billet B, PA-C  cloNIDine (CATAPRES) 0.1 MG tablet TAKE ONE TABLET BY MOUTH TWICE DAILY 12/14/16  Yes Turner, Eber Hong, MD  guaiFENesin (MUCINEX) 600 MG 12 hr tablet Take 600 mg by mouth daily as needed for cough or to loosen phlegm.   Yes [provider]  HYDROcodone-acetaminophen (NORCO) 7.5-325 MG tablet Take 1 tablet by mouth every 6 (six) hours as needed for moderate pain. 04/25/17  Yes Dena Billet B, PA-C  losartan (COZAAR) 100 MG tablet Take 1 tablet (100 mg total) by mouth daily. 03/26/17  Yes Clegg, Amy D, NP  metoprolol succinate (TOPROL-XL) 50 MG 24 hr tablet TAKE ONE TABLET BY MOUTH ONCE DAILY TAKE  WITH  OR  FOLLOWING  A  MEAL 10/26/16  Yes Dena Billet B, PA-C  mirtazapine (REMERON) 30 MG tablet Take 1 tablet (30 mg total) by mouth at bedtime. 05/04/17  Yes Dena Billet B, PA-C  montelukast (SINGULAIR) 10 MG tablet Take 1 tablet (10 mg total) by mouth at bedtime. Patient taking differently: Take 10 mg by mouth at bedtime as needed (for allergies).  04/28/16  Yes Orlena Sheldon, PA-C  nitroGLYCERIN (NITROSTAT) 0.4 MG SL tablet Place 1 tablet (0.4 mg total) under the tongue every 5 (five) minutes as needed for chest pain (x 3 pills). 10/23/15  Yes Orlena Sheldon, PA-C  omeprazole (PRILOSEC) 20 MG capsule Take 1 capsule (20 mg total) by mouth daily as needed (heartburn, acid reflux). 09/25/15  Yes Dixon, Mary B, PA-C  OVER THE COUNTER MEDICATION Place 1 drop into both eyes daily as needed (dry eyes). Eye drops   Yes [provider]  spironolactone (ALDACTONE) 25 MG tablet TAKE ONE-HALF TABLET BY MOUTH ONCE DAILY 12/28/16  Yes Turner, Eber Hong, MD  torsemide (DEMADEX) 20 MG tablet Take 60 mg by mouth daily.    Yes [provider]  triamcinolone cream (KENALOG) 0.1 % Apply 1 application topically 2 (two) times daily. 12/31/16  Yes Watha, Modena Nunnery, MD  warfarin (COUMADIN) 1 MG tablet Take 0.5 tablets (0.5 mg total) by mouth as directed. M-W-F-SA Patient taking differently: Take 3  mg by mouth See admin instructions. Mon/Wed/Fri/Sat 07/08/16  Yes Orlena Sheldon, PA-C  warfarin (COUMADIN) 5 MG tablet Take 0.5 tablets (2.5 mg total) by mouth See admin instructions. Take half a tablet (2.5 mg) on Tuesdays, Thursdays, and Sundays. Take half a tablet with half a tablet of 1 mg (total 3 mg) on all other days. Patient taking differently: Take 2.5 mg by mouth See admin instructions. Take 1/2 tablet  on Tuesdays, Thursdays, and Sundays. 07/08/16  Yes Orlena Sheldon, PA-C     Family History  Problem Relation Age of Onset  . Heart disease Mother   . Heart attack Mother   . Cancer Sister        stomach and colon  . Heart disease Brother  62  . Hypertension Father   . Cancer Sister   . Stroke Neg Hx     Social History   Social History  . Marital status: Married    Spouse name: N/A  . Number of children: N/A  . Years of education: N/A   Social History Main Topics  . Smoking status: Never Smoker  . Smokeless tobacco: Never Used  . Alcohol use No  . Drug use: No  . Sexual activity: Not Currently   Other Topics Concern  . None   Social History Narrative  . None    Review of Systems  Constitutional: Negative for fatigue and fever.  Respiratory: Negative for cough and shortness of breath.   Cardiovascular: Negative for chest pain.  Gastrointestinal: Positive for abdominal pain and nausea.  Psychiatric/Behavioral: Negative for behavioral problems and confusion.    Vital Signs: BP (!) 145/64   Pulse (!) 111   Temp 98.6 F (37 C) (Oral)   Resp (!) 22   SpO2 97%   Physical Exam  Constitutional: She is oriented to person, place, and time. She appears well-developed.  Cardiovascular: Normal rate, regular rhythm and normal heart sounds.   Pulmonary/Chest: Effort normal and breath sounds normal. No respiratory distress.  Abdominal: Soft. There is tenderness.  Neurological: She is alert and oriented to person, place, and time.  Skin: Skin is warm.  Psychiatric:  She has a normal mood and affect. Her behavior is normal. Judgment and thought content normal.  Nursing note and vitals reviewed.   Imaging: Ct Abdomen Pelvis Wo Contrast  Result Date: 07/06/2017 CLINICAL DATA:  Left lower quadrant abdominal pain for 3 days. Low-grade fever. EXAM: CT ABDOMEN AND PELVIS WITHOUT CONTRAST TECHNIQUE: Multidetector CT imaging of the abdomen and pelvis was performed following the standard protocol without IV contrast. COMPARISON:  CT scan 04/27/2017 FINDINGS: Lower chest: The lung bases are clear of acute process. No worrisome pulmonary lesions. The heart is normal in size for age. No pericardial effusion. Hepatobiliary: No worrisome hepatic lesions. Small calcified granulomas are noted. The gallbladder is surgically absent. No common bile duct dilatation. Pancreas: Diffuse fatty change involving the pancreas but no acute inflammation, mass or ductal dilatation. Spleen: Normal size.  Numerous calcified granulomas. Adrenals/Urinary Tract: The adrenal glands and kidneys are unremarkable. No renal, ureteral or bladder calculi or mass. Stomach/Bowel: The stomach, duodenum and small bowel are grossly normal. No acute inflammatory process, mass lesions or obstructive findings. Prior history of right colectomy with ileal colonic anastomosis at the transverse colon. No complicating features. There is mild to moderate inflammation involving the sigmoid colon and significant diverticulosis. Adjacent to the sigmoid colon and closely associated with the patient's left ovary is a 4.9 x 4.4 cm abscess. There is also a thickened fluid filled appearing endometrium which is new since August. Findings most likely due to complicated diverticulitis with abscess involving left ovary and extending into the left fallopian tube and into the endometrial cavity. A small amount of air is also noted in the vagina. I suspect the patient has a vaginal discharge. Vascular/Lymphatic: Small scattered  retroperitoneal lymph nodes and a stable peripancreatic node on image number 32. No mesenteric or retroperitoneal mass. Moderate atherosclerotic calcifications involving the aorta and branch vessels. Reproductive: Uterus and ovaries as discussed above. The right ovary appears normal. Other: No free pelvic fluid.  No inguinal mass or adenopathy. Musculoskeletal: No significant bony findings. Osteoporosis is noted. IMPRESSION: 1. 4.9 x 4.4 cm left pelvic abscess with the epicenter in  the region of the left adnexum. I suspect this is complicated diverticulitis with an abscess involving the left ovary and likely extending into the endometrial cavity. 2. Sigmoid diverticulosis. 3. Remote surgical changes involving the colon without complicating features. These results will be called to the ordering clinician or representative by the Radiologist Assistant, and communication documented in the PACS or zVision Dashboard. Electronically Signed   By: Marijo Sanes M.D.   On: 07/06/2017 16:52    Labs:  CBC:  Recent Labs  03/24/17 1229 07/06/17 1510 07/06/17 1855 07/07/17 0244  WBC 7.6 13.1* 13.3* 13.3*  HGB 11.7* 11.4* 11.3* 10.7*  HCT 37.0 34.5* 34.2* 32.5*  PLT 292 226 245 237    COAGS:  Recent Labs  03/07/17 1427 03/11/17 1146 07/06/17 1115 07/07/17 0236  INR 3.4* 2.55 2.9* 2.10  APTT  --   --   --  56*    BMP:  Recent Labs  05/30/17 1039 07/06/17 1510 07/06/17 1855 07/07/17 0244  NA 139 131* 130* 131*  K 4.1 3.8 3.9 3.3*  CL 97 92* 91* 93*  CO2 26 30 27 25   GLUCOSE 101* 113* 112* 111*  BUN 19 18 16 16   CALCIUM 10.1 8.8 9.2 9.2  CREATININE 1.00 1.04* 1.06* 1.01*  GFRNONAA 53* 51* 48* 51*  GFRAA 61 59* 56* 59*    LIVER FUNCTION TESTS:  Recent Labs  02/01/17 0941 02/26/17 0252 03/24/17 1229 07/06/17 1510 07/06/17 1855  BILITOT 0.4 0.6 0.4 1.2 1.4*  AST 20 32 21 19 22   ALT 18 23 16 11 15   ALKPHOS 82 76 80  --  79  PROT 7.5 7.2 8.0 7.0 7.9  ALBUMIN 4.3 3.8 3.7  --   3.6    TUMOR MARKERS: No results for input(s): AFPTM, CEA, CA199, CHROMGRNA in the last 8760 hours.  Assessment and Plan: Intra-abdominal fluid collection Patient with past medical history of diverticulitis presents with complaint of intra-abdominal fluid collection.  IR consulted for aspiration and drainage at the request of Dr. Marthenia Rolling. Case reviewed by Dr. Barbie Banner who approves patient for procedure.  She does take couamdin at home and last took Tuesday evening. INR today is 2.1 Will reassess for possible drain placement tomorrow.  NPO after midnight.  Continue to hold thinners.    Thank you for this interesting consult.  I greatly enjoyed meeting Kordelia Severin Masek and look forward to participating in their care.  A copy of this report was sent to the requesting provider on this date.  Electronically Signed: Docia Barrier, PA 07/07/2017, 1:38 PM   I spent a total of 40 Minutes    in face to face in clinical consultation, greater than 50% of which was counseling/coordinating care for intra-abdominal fluid collection.

## 2017-07-07 NOTE — ED Notes (Signed)
Pt given ice chips

## 2017-07-07 NOTE — Progress Notes (Signed)
Pharmacy Antibiotic Note  [name] is a 82 y.o. female admitted on 07/07/2017 with pelvic abscess.  Pharmacy has been consulted for Zosyn dosing.  Plan: Zosyn 3.375g IV q8h (4 hour infusion).     Temp (24hrs), Avg:98.7 F (37.1 C), Min:98.4 F (36.9 C), Max:99.1 F (37.3 C)   Recent Labs Lab 07/06/17 1510 07/06/17 1855 07/06/17 1924  WBC 13.1* 13.3*  --   CREATININE  --  1.06*  --   LATICACIDVEN  --   --  1.36    Estimated Creatinine Clearance: 50.3 mL/min (A) (by C-G formula based on SCr of 1.06 mg/dL (H)).    Allergies  Allergen Reactions  . Contrast Media [Iodinated Diagnostic Agents] Other (See Comments) and Hives    Per pt strong burning sensation starting in chest radiating outward Per pt strong burning sensation starting in chest radiating outward  . Clonidine Derivatives Other (See Comments)    Throat dry  . Statins Other (See Comments)    "bones hurt"  . Sulfa Antibiotics Diarrhea    Tremors   . Celebrex [Celecoxib] Rash  . Isosorbide Nitrate Itching and Rash  . Other Itching and Rash    Plastic and paper tape and heart monitor pads  . Tape Itching and Rash    Red Where applied and will spread     Thank you for allowing pharmacy to be a part of this patient's care.  Wynona Neat, PharmD, BCPS  07/07/2017 2:15 AM

## 2017-07-07 NOTE — ED Notes (Addendum)
Message sent to pharmacy for pt meds. Pt placed on 1L [place], O2 increased from 88% on RA to 96% on 2L [place].

## 2017-07-07 NOTE — ED Notes (Signed)
Pt ambulated to and from restroom without difficulty.   

## 2017-07-07 NOTE — ED Provider Notes (Addendum)
TIME SEEN: 1:09 AM  CHIEF COMPLAINT: Abdominal pain  HPI: Patient is an 82 year old female with history of colon cancer status post partial colon resection, appendectomy, CAD, COPD who presents to the emergency department with diffuse lower abdominal pain.  Reports she has been hurting for 5-6 months.  Pain increased this week.  Reports low-grade fever yesterday.  No nausea or vomiting.  States she has had diarrhea for "years".  No bloody stools or melena.  Had a CT scan ordered by her primary care physician as an outpatient which showed diverticulitis with a complex adnexal abscess.  She has had a colonoscopy by Dr. Cristina Gong approximately 5 years ago.  ROS: See HPI Constitutional: "low grade" fever  Eyes: no drainage  ENT: no runny nose   Cardiovascular:  no chest pain  Resp: no SOB  GI: no vomiting; + diarrhea GU: no dysuria Integumentary: no rash  Allergy: no hives  Musculoskeletal: no leg swelling  Neurological: no slurred speech ROS otherwise negative  PAST MEDICAL HISTORY/PAST SURGICAL HISTORY:  Past Medical History:  Diagnosis Date  . Allergy    rhinitis  . Aortic stenosis    mild by echo 06/2017  . Arthritis   . Breast cancer ([place]) 01/06/2012  . Cancer [facility])    right colon and left breast  . Chronic diastolic CHF (congestive heart failure) ([place])   . Colon cancer ([place]) 01/06/2012  . COPD (chronic obstructive pulmonary disease) ([place])   . Coronary artery disease 2006   nonobstructive with 20-30% ostial LAD and LM  . Dilated aortic root ([place])    34mmHg by echo 06/2017  . Diverticulosis   . Edema extremities   . GERD (gastroesophageal reflux disease)   . Hernia   . Hiatal hernia    denies  . Hyperlipidemia   . Hypertension   . Mild aortic stenosis    echo 11/2015 but not noted on echo 06/2016  . Osteopenia   . Permanent atrial fibrillation (HCC)    chronic atrial fibrillation  . Pneumonia    hx child  . Pulmonary HTN ([place])    moderate to severe PASP 34mmHg echo  11/2015 - now 48mmHg by echo 06/2017  . Takotsubo syndrome 07/29/2015   a. EF 35-40% by echo; akinesis of mid-apical anteroseptal and apical myocardium.  EF now normalized on echo 11/2015    MEDICATIONS:  Prior to Admission medications   Medication Sig Start Date End Date Taking? Authorizing Provider  acetaminophen (TYLENOL) 325 MG tablet Take 650 mg by mouth every 6 (six) hours as needed for mild pain. Reported on 03/15/2016    [provider]  amLODipine (NORVASC) 5 MG tablet Take 1 tablet (5 mg total) by mouth daily. 03/25/17   Clegg, Amy D, NP  cetirizine (ZYRTEC) 10 MG tablet Take 10 mg by mouth daily as needed for allergies.    [provider]  clonazePAM (KLONOPIN) 0.5 MG tablet Take 1 tablet (0.5 mg total) by mouth 2 (two) times daily as needed for anxiety. 04/04/17   Dena Billet B, PA-C  cloNIDine (CATAPRES) 0.1 MG tablet TAKE ONE TABLET BY MOUTH TWICE DAILY 12/14/16   [name], MD  guaiFENesin (MUCINEX) 600 MG 12 hr tablet Take 600 mg by mouth daily as needed for cough or to loosen phlegm.    [provider]  HYDROcodone-acetaminophen (NORCO) 7.5-325 MG tablet Take 1 tablet by mouth every 6 (six) hours as needed for moderate pain. 04/25/17   Orlena Sheldon, PA-C  losartan (COZAAR)  100 MG tablet Take 1 tablet (100 mg total) by mouth daily. 03/26/17   Clegg, Amy D, NP  metoprolol succinate (TOPROL-XL) 50 MG 24 hr tablet TAKE ONE TABLET BY MOUTH ONCE DAILY TAKE  WITH  OR  FOLLOWING  A  MEAL 10/26/16   Dena Billet B, PA-C  mirtazapine (REMERON) 30 MG tablet Take 1 tablet (30 mg total) by mouth at bedtime. 05/04/17   Dena Billet B, PA-C  montelukast (SINGULAIR) 10 MG tablet Take 1 tablet (10 mg total) by mouth at bedtime. 04/28/16   Orlena Sheldon, PA-C  nitroGLYCERIN (NITROSTAT) 0.4 MG SL tablet Place 1 tablet (0.4 mg total) under the tongue every 5 (five) minutes as needed for chest pain (x 3 pills). 10/23/15   Orlena Sheldon, PA-C  omeprazole (PRILOSEC) 20 MG capsule  Take 1 capsule (20 mg total) by mouth daily as needed (heartburn, acid reflux). 09/25/15   Dena Billet B, PA-C  ondansetron (ZOFRAN ODT) 4 MG disintegrating tablet Take 1 tablet (4 mg total) by mouth every 8 (eight) hours as needed for nausea or vomiting. 02/26/17   Sherwood Gambler, MD  OVER THE COUNTER MEDICATION Place 1 drop into both eyes daily as needed (dry eyes). Eye drops    [provider]  spironolactone (ALDACTONE) 25 MG tablet TAKE ONE-HALF TABLET BY MOUTH ONCE DAILY 12/28/16   [name], MD  torsemide (DEMADEX) 20 MG tablet Take 60 mg by mouth daily.     [provider]  triamcinolone cream (KENALOG) 0.1 % Apply 1 application topically 2 (two) times daily. 12/31/16   Alycia Rossetti, MD  warfarin (COUMADIN) 1 MG tablet Take 0.5 tablets (0.5 mg total) by mouth as directed. M-W-F-SA Patient taking differently: Take 3 mg by mouth See admin instructions. Mon/Wed/Fri/Sat 07/08/16   Orlena Sheldon, PA-C  warfarin (COUMADIN) 5 MG tablet Take 0.5 tablets (2.5 mg total) by mouth See admin instructions. Take half a tablet (2.5 mg) on Tuesdays, Thursdays, and Sundays. Take half a tablet with half a tablet of 1 mg (total 3 mg) on all other days. 07/08/16   Orlena Sheldon, PA-C    ALLERGIES:  Allergies  Allergen Reactions  . Contrast Media [Iodinated Diagnostic Agents] Other (See Comments) and Hives    Per pt strong burning sensation starting in chest radiating outward Per pt strong burning sensation starting in chest radiating outward  . Clonidine Derivatives Other (See Comments)    Throat dry  . Statins Other (See Comments)    "bones hurt"  . Sulfa Antibiotics Diarrhea    Tremors   . Celebrex [Celecoxib] Rash  . Isosorbide Nitrate Itching and Rash  . Other Itching and Rash    Plastic and paper tape and heart monitor pads  . Tape Itching and Rash    Red Where applied and will spread    SOCIAL HISTORY:  Social History  Substance Use Topics  . Smoking status: Never  Smoker  . Smokeless tobacco: Never Used  . Alcohol use No    FAMILY HISTORY: Family History  Problem Relation Age of Onset  . Heart disease Mother   . Heart attack Mother   . Cancer Sister        stomach and colon  . Heart disease Brother 71  . Hypertension Father   . Cancer Sister   . Stroke Neg Hx     EXAM: BP (!) 170/75 (BP Location: Left Arm)   Pulse 74   Temp 98.5 F (36.9  C) (Oral)   Resp 18   SpO2 97%  CONSTITUTIONAL: Alert and oriented and responds appropriately to questions.  Elderly, afebrile, no significant distress HEAD: Normocephalic EYES: Conjunctivae clear, pupils appear equal, EOMI ENT: normal nose; moist mucous membranes NECK: Supple, no meningismus, no nuchal rigidity, no LAD  CARD: Irregularly irregular; S1 and S2 appreciated; no murmurs, no clicks, no rubs, no gallops RESP: Normal chest excursion without splinting or tachypnea; breath sounds clear and equal bilaterally; no wheezes, no rhonchi, no rales, no hypoxia or respiratory distress, speaking full sentences ABD/GI: Normal bowel sounds; non-distended; soft, tender throughout the lower abdomen, no rebound, no guarding, no peritoneal signs, no hepatosplenomegaly BACK:  The back appears normal and is non-tender to palpation, there is no CVA tenderness EXT: Normal ROM in all joints; non-tender to palpation; no edema; normal capillary refill; no cyanosis, no calf tenderness or swelling    SKIN: Normal color for age and race; warm; no rash NEURO: Moves all extremities equally PSYCH: The patient's mood and manner are appropriate. Grooming and personal hygiene are appropriate.  MEDICAL DECISION MAKING: Patient here with abdominal pain, low-grade fevers, chronic diarrhea.  Had a CT scan without contrast as an outpatient which showed a 4.9 x 4.4 left pelvic abscess that is likely from complicated diverticulitis and involves the left ovary and extends into the endometrial cavity.  Will start IV Zosyn.  Will discuss  with hospitalist for admission.  She will likely need interventional radiology to place a drain in this.  She does not appear septic, toxic at this time.  In no significant distress.  ED PROGRESS:     1:56 AM Discussed patient's case with hospitalist, Dr. Blaine Hamper.  I have recommended admission and patient (and family if present) agree with this plan. Admitting physician will place admission orders.   I reviewed all nursing notes, vitals, pertinent previous records, EKGs, lab and urine results, imaging (as available).   EKG Interpretation  Date/Time:    Ventricular Rate:  74 PR Interval:    QRS Duration: 78 QT Interval:  401 QTC Calculation: 445 R Axis:   76 Text Interpretation:  Atrial fibrillation Anteroseptal infarct, old Nonspecific repol abnormality, diffuse leads No significant change since last tracing Confirmed by Hridhaan Yohn, Cyril Mourning [phone]) on 07/07/2017 3:35:29 AM         Tiquan Bouch, Delice Bison, DO 07/07/17 0210    Shaquanta Harkless, Delice Bison, DO 07/07/17 [phone]

## 2017-07-08 LAB — PROTIME-INR
INR: 2.36
Prothrombin Time: 25.6 seconds — ABNORMAL HIGH (ref 11.4–15.2)

## 2017-07-08 LAB — GLUCOSE, CAPILLARY: Glucose-Capillary: 92 mg/dL (ref 65–99)

## 2017-07-08 MED ORDER — SPIRONOLACTONE 25 MG PO TABS
12.5000 mg | ORAL_TABLET | Freq: Every day | ORAL | Status: DC
  Administered 2017-07-08 – 2017-07-16 (×9): 12.5 mg via ORAL
  Filled 2017-07-08 (×9): qty 1

## 2017-07-08 MED ORDER — TORSEMIDE 20 MG PO TABS
60.0000 mg | ORAL_TABLET | Freq: Every day | ORAL | Status: DC
  Administered 2017-07-08 – 2017-07-16 (×9): 60 mg via ORAL
  Filled 2017-07-08 (×9): qty 3

## 2017-07-08 NOTE — Progress Notes (Signed)
Patient ID: [name], female   DOB: 1934/09/12, 82 y.o.   MRN: [id number]   Pt tentatively scheduled for abscess drain placement today INR 2.36---unable to move forward safely per Dr Pascal Lux  Will check INR in am Plan for 11/3 if INR 1.5 or lower

## 2017-07-08 NOTE — Progress Notes (Signed)
07/08/17 @ 1609 - to discontinue cardiac monioring.  CCMD notified.

## 2017-07-08 NOTE — Progress Notes (Signed)
  PROGRESS NOTE  Seraphine Gudiel Blanks TOI:[id number] DOB: 1934/11/16 DOA: 07/07/2017 PCP: Orlena Sheldon, PA-C  Brief Narrative: 65yow PMH diverticulitis, colon cancer s/p partial colectomy, XRT, presented with abd pain. CT showed diverticulitis with complex adnexal abscess. Admitted for pelvic abscess.  Assessment/Plan Pelvic abscess, left, epicenter left adnexum, suspected complicated diverticulitis with abscess involing left ovary and extending into endometrial cavity - afebrile, VSS. Continue IV abx -IR for drain placement when INR has decreased - will need f/u with surgery for consideration of elective partial colectomy given h/o diverticulitis.  Hyponatremia, hypochloridemia, dehydration, complicated by diuretics -IVF, recheck BMP in AM  Acute hypoxic resp failure with h/o COPD, pulmonary HTN -appears resolved.  PMH Colon cancer s/p partial colectomy  COPD -stable  CAD -asymptomatic  Permanent atrial fibrillation. CHA2DS2-VASc Score is 6 -hold warfarin  -continue metoprolol  Chronic diastolic CHF -resume torsemide, spironolactone -continue metoprolol  Moderate to severe pulm HTN  DVT prophylaxis: INR > 2 Code Status: full Family Communication: family at bedside Disposition Plan: home    Murray Hodgkins, MD  [facility] Direct contact: [phone] --Via amion app OR  --[email or web address]; password [email or web address]  7PM-7AM contact night coverage as above 07/08/2017, 2:48 PM  LOS: 1 day   Consultants:  IR  Procedures:    Antimicrobials:  Zosyn 10/31 >>  Interval history/Subjective: Some abdominal soreness but otherwise feels ok.  Objective: Vitals:  Vitals:   07/08/17 0825 07/08/17 1146  BP:  (!) 134/44  Pulse: 65 (!) 57  Resp:  16  Temp:  98.2 F (36.8 C)  SpO2:  91%    Exam:    Constitutional:  . Appears calm and comfortable sitting in chair Respiratory:  . CTA bilaterally, no w/r/r.  . Respiratory effort normal.  Cardiovascular:  . RRR, no m/r/g.  telemetry afib . 2+ BLE extremity edema   Abdomen:  . Soft, distended, mildly tender to palpation Psychiatric:  . Mental status o Mood, affect appropriate   I have personally reviewed the following:   Labs:  CBG stable  Na 131, chloride 93  INR 2.36  Imaging studies:  CT abd/pelvis noted  Medical tests:  EKG afib   Scheduled Meds: . amLODipine  5 mg Oral Daily  . cloNIDine  0.1 mg Oral BID  . loratadine  10 mg Oral Daily  . losartan  100 mg Oral Daily  . metoprolol succinate  50 mg Oral Daily  . mirtazapine  30 mg Oral QHS  . montelukast  10 mg Oral QHS  . pantoprazole  40 mg Oral Daily  . triamcinolone cream  1 application Topical BID   Continuous Infusions: . sodium chloride 75 mL/hr at 07/07/17 0249  . piperacillin-tazobactam (ZOSYN)  IV 3.375 g (07/08/17 1215)    Principal Problem:   Pelvic abscess in female Active Problems:   GERD (gastroesophageal reflux disease)   Hypertension   COPD (chronic obstructive pulmonary disease) (HCC)   Coronary artery disease   Permanent atrial fibrillation (HCC)   Chronic anticoagulation   Chronic diastolic CHF (congestive heart failure) ([place])   LOS: 1 day

## 2017-07-09 LAB — GLUCOSE, CAPILLARY: Glucose-Capillary: 101 mg/dL — ABNORMAL HIGH (ref 65–99)

## 2017-07-09 LAB — PROTIME-INR
INR: 2.1
Prothrombin Time: 23.4 seconds — ABNORMAL HIGH (ref 11.4–15.2)

## 2017-07-09 NOTE — Progress Notes (Signed)
Paged MD for diet order.

## 2017-07-09 NOTE — Progress Notes (Signed)
  PROGRESS NOTE  [name] ZOX:[id number] DOB: [birth year] DOA: 07/07/2017 PCP: Orlena Sheldon, PA-C  Brief Narrative: 78yow PMH diverticulitis, colon cancer s/p partial colectomy, XRT, presented with abd pain. CT showed diverticulitis with complex adnexal abscess. Admitted for pelvic abscess.  Assessment/Plan Pelvic abscess, left, epicenter left adnexum, suspected complicated diverticulitis with abscess involing left ovary and extending into endometrial cavity - remains afebrile, VSS. -continue IV abx, appreciate surgery evaluation -INR still too high for drain placement. Check INR again in AM. NPO after midnight.  Hyponatremia, hypochloridemia, dehydration, complicated by diuretics -IVF -check BMP in AM  Acute hypoxic resp failure with h/o COPD, pulmonary HTN -resolved  PMH Colon cancer s/p partial colectomy  COPD -remains stable  CAD -asymptomatic  Permanent atrial fibrillation. CHA2DS2-VASc Score is 6 -hold warfarin  -stable, continue metoprolol  Chronic diastolic CHF -continue torsemide, spironolactone -continue metoprolol  Moderate to severe pulm HTN  DVT prophylaxis: INR > 2 Code Status: full Family Communication: husband at bedside Disposition Plan: home    Murray Hodgkins, MD  [facility] Direct contact: [phone] --Via amion app OR  --[email or web address]; password [email or web address]  7PM-7AM contact night coverage as above 07/09/2017, 1:46 PM  LOS: 2 days   Consultants:  IR  General surgery  Procedures:    Antimicrobials:  Zosyn 10/31 >>  Interval history/Subjective: General surgery evaluation noted.  Reports left-sided abdominal pain.  Objective: Vitals:  Vitals:   07/09/17 0527 07/09/17 1221  BP: (!) 159/68 (!) 144/58  Pulse: (!) 57 64  Resp: 18 20  Temp: 98.2 F (36.8 C) (!) 97.3 F (36.3 C)  SpO2: 92% 96%    Exam:    Constitutional:  . Appears calm, uncomfortable, but not toxic Respiratory:  . CTA bilaterally, no w/r/r.   . Respiratory effort normal.  Cardiovascular:  . RRR, no m/r/g . No LE extremity edema   Abdomen:  . Left-sided abdominal pain with palpation Psychiatric:  . Mental status o Mood, affect appropriate  I have personally reviewed the following:   Labs:  INR 2.36 >> 2.1  CBG stable   Scheduled Meds: . cloNIDine  0.1 mg Oral BID  . loratadine  10 mg Oral Daily  . losartan  100 mg Oral Daily  . metoprolol succinate  50 mg Oral Daily  . mirtazapine  30 mg Oral QHS  . montelukast  10 mg Oral QHS  . pantoprazole  40 mg Oral Daily  . spironolactone  12.5 mg Oral Daily  . torsemide  60 mg Oral Daily  . triamcinolone cream  1 application Topical BID   Continuous Infusions: . piperacillin-tazobactam (ZOSYN)  IV 3.375 g (07/09/17 1034)    Principal Problem:   Pelvic abscess in female Active Problems:   GERD (gastroesophageal reflux disease)   Hypertension   COPD (chronic obstructive pulmonary disease) (HCC)   Coronary artery disease   Permanent atrial fibrillation (HCC)   Chronic anticoagulation   Chronic diastolic CHF (congestive heart failure) ([place])   LOS: 2 days

## 2017-07-09 NOTE — Progress Notes (Signed)
  INR still >2  Continue to hold blood thinners.  Will recheck again tomorrow.   Karalynn Cottone S Jerrit Horen PA-C 07/09/2017 9:32 AM

## 2017-07-09 NOTE — Consult Note (Signed)
Reason for Consult: Pelvic abscess Referring Physician: Florencia [name] GI: Dr. Cristina Gong Cardiology: Dr. Jeralene Peters [name] is an 82 y.o. female.  HPI: 82 year old female seen at the [facility] family medicine clinic on 07/06/17 for a Coumadin follow-up.  She reported abdominal discomfort at the started 10/29 6/18 in the evening.  She denied change in her bowel habits no diarrhea and was not constipated.  No nausea or vomiting.  Lab work was obtained and subsequently a CT of the abdomen was obtained.  This showed a 4.9 x 4.4 cm left pelvic abscess with the epicenter in the region of the left adnexum.  It was their opinion this was most likely complicated diverticulitis with abscess involving the left ovary and extending into the endometrial cavity.  Patient also had sigmoid diverticulosis.  Patient has surgical changes involving the colon without complicating issues.  She was then sent to the emergency department and subsequently admitted by the Medical Service. She was admitted with pelvic abscess.  She was started on IV Zosyn therapy, her Coumadin was placed on hold.  She has been afebrile in the hospital.  Her vital signs are stable.  Labs on admission showed a sodium of 131, potassium of 3.3.  Creatinine 1.01.  BNP of 128.  Lactate was 1.1.  CBC is 13.3.  INR on admission was 2.10.  He remained stable at 2.10 today.  Urinalysis is normal.  On exam this a.m. she is still tender in the left lower quadrant, pain extends to her back with palpitation.  We are asked to see.  Past Medical History:  Diagnosis Date  . Allergy    rhinitis  . Aortic stenosis    mild by echo 06/2017  . Arthritis   . Breast cancer ([place]) 01/06/2012  . Cancer [facility])    right colon and left breast  . Chronic diastolic CHF (congestive heart failure) ([place])   . Colon cancer ([place]) 01/06/2012  . COPD (chronic obstructive pulmonary disease) ([place])   . Coronary artery disease 2006   nonobstructive with 20-30% ostial  LAD and LM  . Dilated aortic root ([place])    7mmHg by echo 06/2017  . Diverticulosis   . Edema extremities   . GERD (gastroesophageal reflux disease)   . Hernia   . Hiatal hernia    denies  . Hyperlipidemia   . Hypertension   . Mild aortic stenosis    echo 11/2015 but not noted on echo 06/2016  . Osteopenia   . Permanent atrial fibrillation (HCC)    chronic atrial fibrillation  . Pneumonia    hx child  . Pulmonary HTN ([place])    moderate to severe PASP 63mmHg echo 11/2015 - now 13mmHg by echo 06/2017  . Takotsubo syndrome 07/29/2015   a. EF 35-40% by echo; akinesis of mid-apical anteroseptal and apical myocardium.  EF now normalized on echo 11/2015    Past Surgical History:  Procedure Laterality Date  . APPENDECTOMY    . BREAST SURGERY     lumpectomy left  . CARDIAC CATHETERIZATION    . CARDIAC CATHETERIZATION N/A 07/28/2015   Procedure: Left Heart Cath and Coronary Angiography;  Surgeon: Peter M Martinique, MD;  Location: [facility];  Service: Cardiovascular;  Laterality: N/A;  . CHOLECYSTECTOMY    . COLECTOMY     right side  . EXCISION OF ACCESSORY NIPPLE Bilateral 05/30/2013   Procedure: BILATERAL NIPPLE BIOPSY;  Surgeon: Merrie Roof, MD;  Location: [facility];  Service: General;  Laterality: Bilateral;  . EYE SURGERY Bilateral 14   cataracts  . MASTECTOMY PARTIAL / LUMPECTOMY  2008   left breast  . SPLIT NIGHT STUDY  02/02/2016        Family History  Problem Relation Age of Onset  . Heart disease Mother   . Heart attack Mother   . Cancer Sister        stomach and colon  . Heart disease Brother 30  . Hypertension Father   . Cancer Sister   . Stroke Neg Hx     Social History:  reports that she has never smoked. She has never used smokeless tobacco. She reports that she does not drink alcohol or use drugs. Tobacco: None EtOH: None Drugs: None She is married and lives with her husband age 26 he is also in poor health. 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  Allergies:  Allergies  Allergen Reactions  . Contrast Media [Iodinated Diagnostic Agents] Other (See Comments) and Hives    Per pt strong burning sensation starting in chest radiating outward Per pt strong burning sensation starting in chest radiating outward  . Clonidine Derivatives Other (See Comments)    Throat dry  . Statins Other (See Comments)    "bones hurt"  . Sulfa Antibiotics Diarrhea    Tremors   . Celebrex [Celecoxib] Rash  . Isosorbide Nitrate Itching and Rash  . Other Itching and Rash    Plastic and paper tape and heart monitor pads  . Tape Itching and Rash    Red Where applied and will spread    Medications:  Prior to Admission:  Prescriptions Prior to Admission  Medication Sig Dispense Refill Last Dose  . acetaminophen (TYLENOL) 325 MG tablet Take 650 mg by mouth every 6 (six) hours as needed for mild pain. Reported on 03/15/2016   Past Month at Unknown time  . amLODipine (NORVASC) 5 MG tablet Take 1 tablet (5 mg total) by mouth daily. 30 tablet 3 07/06/2017 at Unknown time  . cetirizine (ZYRTEC) 10 MG tablet Take 10 mg by mouth daily as needed for allergies.   Past Month at Unknown time  . clonazePAM (KLONOPIN) 0.5 MG tablet Take 1 tablet (0.5 mg total) by mouth 2 (two) times daily as needed for anxiety. 60 tablet 1 07/06/2017 at Unknown time  . cloNIDine (CATAPRES) 0.1 MG tablet TAKE ONE TABLET BY MOUTH TWICE DAILY 60 tablet 11 07/06/2017 at Unknown time  . guaiFENesin (MUCINEX) 600 MG 12 hr tablet Take 600 mg by mouth daily as needed for cough or to loosen phlegm.   Past Month at Unknown time  . HYDROcodone-acetaminophen (NORCO) 7.5-325 MG tablet Take 1 tablet by mouth every 6 (six) hours as needed for moderate pain. 30 tablet 0 Past Week at Unknown time  . losartan (COZAAR) 100 MG tablet Take 1 tablet (100 mg total) by mouth daily. 30 tablet 3 07/06/2017 at Unknown time  . metoprolol succinate (TOPROL-XL) 50 MG 24 hr tablet TAKE ONE TABLET BY  MOUTH ONCE DAILY TAKE  WITH  OR  FOLLOWING  A  MEAL 90 tablet 3 07/06/2017 at 0800  . mirtazapine (REMERON) 30 MG tablet Take 1 tablet (30 mg total) by mouth at bedtime. 30 tablet 1 07/05/2017 at Unknown time  . montelukast (SINGULAIR) 10 MG tablet Take 1 tablet (10 mg total) by mouth at bedtime. (Patient taking differently: Take 10 mg by mouth at bedtime as needed (for allergies). )  30 tablet 3 Past Month at Unknown time  . nitroGLYCERIN (NITROSTAT) 0.4 MG SL tablet Place 1 tablet (0.4 mg total) under the tongue every 5 (five) minutes as needed for chest pain (x 3 pills). 30 tablet 2 unkonwn  . omeprazole (PRILOSEC) 20 MG capsule Take 1 capsule (20 mg total) by mouth daily as needed (heartburn, acid reflux). 90 capsule 3 Past Month at Unknown time  . OVER THE COUNTER MEDICATION Place 1 drop into both eyes daily as needed (dry eyes). Eye drops   Past Month at Unknown time  . spironolactone (ALDACTONE) 25 MG tablet TAKE ONE-HALF TABLET BY MOUTH ONCE DAILY 45 tablet 3 07/06/2017 at Unknown time  . torsemide (DEMADEX) 20 MG tablet Take 60 mg by mouth daily.    07/06/2017 at Unknown time  . triamcinolone cream (KENALOG) 0.1 % Apply 1 application topically 2 (two) times daily. 30 g 0 Past Month at Unknown time  . warfarin (COUMADIN) 1 MG tablet Take 0.5 tablets (0.5 mg total) by mouth as directed. M-W-F-SA (Patient taking differently: Take 3 mg by mouth See admin instructions. Mon/Wed/Fri/Sat) 90 tablet 2 07/04/2017 at 2300  . warfarin (COUMADIN) 5 MG tablet Take 0.5 tablets (2.5 mg total) by mouth See admin instructions. Take half a tablet (2.5 mg) on Tuesdays, Thursdays, and Sundays. Take half a tablet with half a tablet of 1 mg (total 3 mg) on all other days. (Patient taking differently: Take 2.5 mg by mouth See admin instructions. Take 1/2 tablet  on Tuesdays, Thursdays, and Sundays.) 90 tablet 2 07/05/2017 at 2300   Scheduled: . cloNIDine  0.1 mg Oral BID  . loratadine  10 mg Oral Daily  . losartan   100 mg Oral Daily  . metoprolol succinate  50 mg Oral Daily  . mirtazapine  30 mg Oral QHS  . montelukast  10 mg Oral QHS  . pantoprazole  40 mg Oral Daily  . spironolactone  12.5 mg Oral Daily  . torsemide  60 mg Oral Daily  . triamcinolone cream  1 application Topical BID   Continuous: . piperacillin-tazobactam (ZOSYN)  IV 3.375 g (07/09/17 0230)   Anti-infectives    Start     Dose/Rate Route Frequency Ordered Stop   07/07/17 1000  piperacillin-tazobactam (ZOSYN) IVPB 3.375 g     3.375 g 12.5 mL/hr over 240 Minutes Intravenous Every 8 hours 07/07/17 0902     11 /01/18 0800  piperacillin-tazobactam (ZOSYN) IVPB 3.375 g  Status:  Discontinued     3.375 g 12.5 mL/hr over 240 Minutes Intravenous Every 8 hours 07/07/17 0217 07/07/17 0902   07/07/17 0130  piperacillin-tazobactam (ZOSYN) IVPB 3.375 g     3.375 g 100 mL/hr over 30 Minutes Intravenous  Once 07/07/17 0125 07/07/17 0335      Results for orders placed or performed during the hospital encounter of 07/07/17 (from the past 48 hour(s))  CBG monitoring, ED     Status: None   Collection Time: 07/07/17 11:58 AM  Result Value Ref Range   Glucose-Capillary 80 65 - 99 mg/dL  Protime-INR     Status: Abnormal   Collection Time: 07/08/17  4:38 AM  Result Value Ref Range   Prothrombin Time 25.6 (H) 11.4 - 15.2 seconds   INR 2.36   Glucose, capillary     Status: None   Collection Time: 07/08/17  5:31 AM  Result Value Ref Range   Glucose-Capillary 92 65 - 99 mg/dL  Glucose, capillary     Status: Abnormal  Collection Time: 07/09/17  6:25 AM  Result Value Ref Range   Glucose-Capillary 101 (H) 65 - 99 mg/dL  Protime-INR     Status: Abnormal   Collection Time: 07/09/17  7:06 AM  Result Value Ref Range   Prothrombin Time 23.4 (H) 11.4 - 15.2 seconds   INR 2.10     No results found.  Review of Systems  Constitutional: Positive for weight loss (24 pounds over the last year.). Negative for diaphoresis, fever and malaise/fatigue.   HENT: Negative.   Eyes: Negative.   Respiratory: Positive for wheezing (Occasional).   Cardiovascular: Positive for claudication (Both calves with ambulation.) and leg swelling. Negative for chest pain, palpitations, orthopnea and PND.  Gastrointestinal: Positive for abdominal pain, blood in stool (Occasionally with hemorrhoids, none recently.), constipation, diarrhea (She has diarrhea and if she takes Imodium it causes constipation.) and heartburn. Negative for vomiting.  Genitourinary: Negative.   Musculoskeletal: Positive for back pain.  Skin: Negative.   Neurological: Negative.   Endo/Heme/Allergies: Bruises/bleeds easily (On chronic Coumadin for atrial fibrillation.).  Psychiatric/Behavioral: Negative.    Blood pressure (!) 159/68, pulse (!) 57, temperature 98.2 F (36.8 C), temperature source Oral, resp. rate 18, height 5\' 7"  (1.702 m), weight 98.2 kg (216 lb 6.4 oz), SpO2 92 %. Physical Exam  Constitutional: She is oriented to person, place, and time. She appears well-developed and well-nourished. No distress.  HENT:  Head: Normocephalic and atraumatic.  Mouth/Throat: No oropharyngeal exudate.  Eyes: Right eye exhibits no discharge. Left eye exhibits no discharge. No scleral icterus.  Pupils are equal  Neck: Normal range of motion. Neck supple. No JVD present. No tracheal deviation present. No thyromegaly present.  GI: She exhibits no mass. There is tenderness. There is rebound (perhaps a little rebound). There is no guarding.  Midline surgical incision well healed.    Musculoskeletal: She exhibits edema (trace). She exhibits no tenderness or deformity.  Lymphadenopathy:    She has no cervical adenopathy.  Neurological: She is alert and oriented to person, place, and time. No cranial nerve deficit.  Skin: Skin is warm and dry. No rash noted. She is not diaphoretic. No erythema. No pallor.  Psychiatric: She has a normal mood and affect. Her behavior is normal. Judgment and  thought content normal.    Assessment/Plan: Pelvic abscess/most likely diverticulitis. History of colon cancer with right hemicolectomy and chemotherapy, 2003 History of left breast cancer with lumpectomy and chemotherapy. CAD/history of Takotsubo cardiomyopathy now resolved/chronic AF on coumadin COPD/history of pulmonary hypertension Hyperlipidemia Hypertension History of GERD/hiatal hernia.  Plan: Currently the patient's INR is 2.10.  She has been seen by interventional radiology and they plan to place a drain when her Coumadin level is safe.  Agree with antibiotics and conservative management.  We will follow with you.    Kofi Murrell 07/09/2017, 8:54 AM

## 2017-07-10 DIAGNOSIS — R109 Unspecified abdominal pain: Secondary | ICD-10-CM

## 2017-07-10 LAB — BASIC METABOLIC PANEL
Anion gap: 10 (ref 5–15)
BUN: 11 mg/dL (ref 6–20)
CALCIUM: 9 mg/dL (ref 8.9–10.3)
CO2: 31 mmol/L (ref 22–32)
CREATININE: 1.05 mg/dL — AB (ref 0.44–1.00)
Chloride: 97 mmol/L — ABNORMAL LOW (ref 101–111)
GFR calc Af Amer: 57 mL/min — ABNORMAL LOW (ref >60)
GFR, EST NON AFRICAN AMERICAN: 49 mL/min — AB (ref >60)
GLUCOSE: 94 mg/dL (ref 65–99)
Potassium: 3.4 mmol/L — ABNORMAL LOW (ref 3.5–5.1)
Sodium: 138 mmol/L (ref 135–145)

## 2017-07-10 LAB — GLUCOSE, CAPILLARY: Glucose-Capillary: 100 mg/dL — ABNORMAL HIGH (ref 65–99)

## 2017-07-10 LAB — PROTIME-INR
INR: 2.01
Prothrombin Time: 22.6 seconds — ABNORMAL HIGH (ref 11.4–15.2)

## 2017-07-10 MED ORDER — PHYTONADIONE 1 MG/0.5 ML ORAL SOLUTION
0.5000 mg | Freq: Once | ORAL | Status: AC
  Administered 2017-07-10: 0.5 mg via ORAL
  Filled 2017-07-10: qty 0.5

## 2017-07-10 MED ORDER — BOOST / RESOURCE BREEZE PO LIQD
1.0000 | Freq: Three times a day (TID) | ORAL | Status: DC
  Administered 2017-07-10 – 2017-07-15 (×6): 1 via ORAL

## 2017-07-10 NOTE — Progress Notes (Signed)
  PROGRESS NOTE  [name] VFI:[id number] DOB: 05-20-1935 DOA: 07/07/2017 PCP: Orlena Sheldon, PA-C  Brief Narrative: 43yow PMH diverticulitis, colon cancer s/p partial colectomy, XRT, presented with abd pain. CT showed diverticulitis with complex adnexal abscess. Admitted for pelvic abscess.  Assessment/Plan Pelvic abscess, left, epicenter left adnexum, suspected complicated diverticulitis with abscess involing left ovary and extending into endometrial cavity - intermittent pain but remains afebrile, VSS - continue IV abx, appreciate surgery recs - INR still too high, delaying procedure. Unclear when INR will be withing range. On day 4 of hospitalization. H/o afib but no VTE or stroke. will give very low dose vitamin K po. Discussed risk/benefit with patient and she wishes to proceed.    Hyponatremia, hypochloridemia, dehydration, complicated by diuretics - resolved with IVF  Acute hypoxic resp failure with h/o COPD, pulmonary HTN -resolved  PMH Colon cancer s/p partial colectomy  COPD - stable  CAD - remains asymptomatic  Permanent atrial fibrillation. CHA2DS2-VASc Score is 6 - resume warfarin after procedures - continue metoprolol  Chronic diastolic CHF - appears euvolemic - continue torsemide, spironolactone - continue metoprolol  Moderate to severe pulm HTN  DVT prophylaxis: INR > 2 Code Status: full Family Communication: husband at bedside 11/4 Disposition Plan: home    Murray Hodgkins, MD  [facility] Direct contact: [phone] --Via amion app OR  --[email or web address]; password [email or web address]  7PM-7AM contact night coverage as above 07/10/2017, 2:07 PM  LOS: 3 days   Consultants:  IR  General surgery  Procedures:    Antimicrobials:  Zosyn 10/31 >>  Interval history/Subjective: Intermittent abdominal pain. No vomiting.  Objective: Vitals:  Vitals:   07/10/17 0448 07/10/17 1225  BP: (!) 171/96 (!) 138/48  Pulse: 72 75  Resp: 20 20  Temp: 99 F  (37.2 C) 98.1 F (36.7 C)  SpO2: 92% 92%    Exam:     Constitutional:  Appears calm, mildly uncomfortable Respiratory:  . CTA bilaterally, no w/r/r.  . Respiratory effort normal.  Cardiovascular:  . RRR, no m/r/g . No LE extremity edema   Abdomen:  . Soft mild left-sided tenderness Psychiatric:  . Mental status o Mood, affect appropriate  I have personally reviewed the following:   Labs:  BMP unremarkable except K+ 3.4  INR 2.36 >> 2.1 >> 2.01  CBG remains stable   Scheduled Meds: . cloNIDine  0.1 mg Oral BID  . feeding supplement  1 Container Oral TID BM  . loratadine  10 mg Oral Daily  . losartan  100 mg Oral Daily  . metoprolol succinate  50 mg Oral Daily  . mirtazapine  30 mg Oral QHS  . montelukast  10 mg Oral QHS  . pantoprazole  40 mg Oral Daily  . spironolactone  12.5 mg Oral Daily  . torsemide  60 mg Oral Daily  . triamcinolone cream  1 application Topical BID   Continuous Infusions: . piperacillin-tazobactam (ZOSYN)  IV 3.375 g (07/10/17 1047)    Principal Problem:   Pelvic abscess in female Active Problems:   GERD (gastroesophageal reflux disease)   Hypertension   COPD (chronic obstructive pulmonary disease) (HCC)   Coronary artery disease   Permanent atrial fibrillation (HCC)   Chronic anticoagulation   Chronic diastolic CHF (congestive heart failure) ([place])   LOS: 3 days

## 2017-07-10 NOTE — Progress Notes (Signed)
Patient ID: [name], female   DOB: 25-Jul-1935, 82 y.o.   MRN: [id number]   Acute Care Surgery Service Progress Note:    Chief Complaint/Subjective: Has some intermittent LLQ/suprapubic pain Tomato soup causes heartburn No n/v  Objective: Vital signs in last 24 hours: Temp:  [97.3 F (36.3 C)-99 F (37.2 C)] 99 F (37.2 C) (11/04 0448) Pulse Rate:  [58-72] 72 (11/04 0448) Resp:  [18-20] 20 (11/04 0448) BP: (144-171)/(49-96) 171/96 (11/04 0448) SpO2:  [92 %-96 %] 92 % (11/04 0448) Weight:  [96.4 kg (212 lb 8 oz)] 96.4 kg (212 lb 8 oz) (11/04 0448) Last BM Date: 07/09/17  Intake/Output from previous day: 11/03 0701 - 11/04 0700 In: 1120 [P.O.:920; IV Piggyback:200] Out: 1078 [Urine:1075; Stool:3] Intake/Output this shift: Total I/O In: 240 [P.O.:240] Out: -   Lungs: cta, nonlabored  Cardiovascular: reg  Abd: obese, soft, min LLQ ttp; no guarding/rebound  Extremities: no edema, +SCDs  Neuro: alert, nonfocal  Lab Results: CBC  No results for input(s): WBC, HGB, HCT, PLT in the last 72 hours. BMET Recent Labs    07/10/17 0336  NA 138  K 3.4*  CL 97*  CO2 31  GLUCOSE 94  BUN 11  CREATININE 1.05*  CALCIUM 9.0   LFT Hepatic Function Latest Ref Rng & Units 07/06/2017 07/06/2017 03/24/2017  Total Protein 6.5 - 8.1 g/dL 7.9 7.0 8.0  Albumin 3.5 - 5.0 g/dL 3.6 - 3.7  AST 15 - 41 U/L 22 19 21   ALT 14 - 54 U/L 15 11 16   Alk Phosphatase 38 - 126 U/L 79 - 80  Total Bilirubin 0.3 - 1.2 mg/dL 1.4(H) 1.2 0.4  Bilirubin, Direct 0.00 - 0.40 mg/dL - - -   PT/INR Recent Labs    07/09/17 0706 07/10/17 0336  LABPROT 23.4* 22.6*  INR 2.10 2.01   ABG No results for input(s): PHART, HCO3 in the last 72 hours.  Invalid input(s): PCO2, PO2  Studies/Results:  Anti-infectives: Anti-infectives (From admission, onward)   Start     Dose/Rate Route Frequency Ordered Stop   07/07/17 1000  piperacillin-tazobactam (ZOSYN) IVPB 3.375 g     3.375 g 12.5 mL/hr over 240  Minutes Intravenous Every 8 hours 07/07/17 0902     07/07/17 0800  piperacillin-tazobactam (ZOSYN) IVPB 3.375 g  Status:  Discontinued     3.375 g 12.5 mL/hr over 240 Minutes Intravenous Every 8 hours 07/07/17 0217 07/07/17 0902   07/07/17 0130  piperacillin-tazobactam (ZOSYN) IVPB 3.375 g     3.375 g 100 mL/hr over 30 Minutes Intravenous  Once 07/07/17 0125 07/07/17 0335      Medications: Scheduled Meds: . cloNIDine  0.1 mg Oral BID  . feeding supplement  1 Container Oral TID BM  . loratadine  10 mg Oral Daily  . losartan  100 mg Oral Daily  . metoprolol succinate  50 mg Oral Daily  . mirtazapine  30 mg Oral QHS  . montelukast  10 mg Oral QHS  . pantoprazole  40 mg Oral Daily  . spironolactone  12.5 mg Oral Daily  . torsemide  60 mg Oral Daily  . triamcinolone cream  1 application Topical BID   Continuous Infusions: . piperacillin-tazobactam (ZOSYN)  IV Stopped (07/10/17 0541)   PRN Meds:.acetaminophen, albuterol, clonazePAM, guaiFENesin, HYDROcodone-acetaminophen, nitroGLYCERIN, ondansetron **OR** ondansetron (ZOFRAN) IV, zolpidem  Assessment/Plan: Patient Active Problem List   Diagnosis Date Noted  . Pelvic abscess in female 07/07/2017  . Aortic stenosis   . Dyslipidemia, goal LDL below  70 11/28/2016  . Monitoring for long-term anticoagulant use 12/31/2015  . Chronic diastolic CHF (congestive heart failure) ([place]) 10/28/2015  . Insomnia 10/23/2015  . Leg swelling 08/11/2015  . Takotsubo syndrome 07/29/2015  . NSTEMI (non-ST elevated myocardial infarction) ([place]) 07/28/2015  . SOB (shortness of breath) 08/15/2014  . Dilated aortic root ([place])   . Pulmonary HTN ([place])   . Permanent atrial fibrillation ([place]) 07/04/2013  . Chronic anticoagulation 07/04/2013  . Coronary artery disease   . Lesion of nipple 05/15/2013  . GERD (gastroesophageal reflux disease)   . Hypertension   . COPD (chronic obstructive pulmonary disease) ([place])   . Osteopenia   . Diverticulosis   .  Hiatal hernia   . Abdominal pain 01/11/2012  . Breast cancer of upper-outer quadrant of left female breast ([place]) 01/06/2012  . Colon cancer ([place]) 01/06/2012   Pelvic abscess/most likely diverticulitis with pelvic abscess. History of colon cancer with right hemicolectomy and chemotherapy, 2003 History of left breast cancer with lumpectomy and chemotherapy. CAD/history of Takotsubo cardiomyopathy now resolved/chronic AF on coumadin COPD/history of pulmonary hypertension Hyperlipidemia Hypertension History of GERD/hiatal hernia.  INR >2 so IR wont proceed with drain today Will write for full liquids and protein shakes today -discussed drinking shakes with pt Npo p mn Repeat labs in am Agree with IR drain in abscess   Disposition:  LOS: 3 days    Leighton Ruff. Redmond Pulling, MD, FACS General, Bariatric, & Minimally Invasive Surgery [phone] [facility]

## 2017-07-10 NOTE — Progress Notes (Signed)
  INR still 2  Will recheck INR again tomorrow am.  Murrell Redden PA-C 07/10/2017 9:29 AM

## 2017-07-11 ENCOUNTER — Inpatient Hospital Stay (HOSPITAL_COMMUNITY): Payer: Medicare Other

## 2017-07-11 LAB — CBC
HCT: 31.1 % — ABNORMAL LOW (ref 36.0–46.0)
Hemoglobin: 9.9 g/dL — ABNORMAL LOW (ref 12.0–15.0)
MCH: 29.3 pg (ref 26.0–34.0)
MCHC: 31.8 g/dL (ref 30.0–36.0)
MCV: 92 fL (ref 78.0–100.0)
Platelets: 254 10*3/uL (ref 150–400)
RBC: 3.38 MIL/uL — ABNORMAL LOW (ref 3.87–5.11)
RDW: 13.9 % (ref 11.5–15.5)
WBC: 7.7 10*3/uL (ref 4.0–10.5)

## 2017-07-11 LAB — PROTIME-INR
INR: 1.76
Prothrombin Time: 20.4 seconds — ABNORMAL HIGH (ref 11.4–15.2)

## 2017-07-11 LAB — BASIC METABOLIC PANEL
Anion gap: 11 (ref 5–15)
BUN: 11 mg/dL (ref 6–20)
CHLORIDE: 96 mmol/L — AB (ref 101–111)
CO2: 30 mmol/L (ref 22–32)
Calcium: 8.9 mg/dL (ref 8.9–10.3)
Creatinine, Ser: 1 mg/dL (ref 0.44–1.00)
GFR calc Af Amer: >60 mL/min (ref >60)
GFR calc non Af Amer: 52 mL/min — ABNORMAL LOW (ref >60)
GLUCOSE: 94 mg/dL (ref 65–99)
Potassium: 3.1 mmol/L — ABNORMAL LOW (ref 3.5–5.1)
Sodium: 137 mmol/L (ref 135–145)

## 2017-07-11 LAB — GLUCOSE, CAPILLARY: Glucose-Capillary: 103 mg/dL — ABNORMAL HIGH (ref 65–99)

## 2017-07-11 MED ORDER — POTASSIUM CHLORIDE 10 MEQ/100ML IV SOLN
10.0000 meq | INTRAVENOUS | Status: AC
  Administered 2017-07-11 (×2): 10 meq via INTRAVENOUS
  Filled 2017-07-11 (×4): qty 100

## 2017-07-11 NOTE — Progress Notes (Signed)
Paged MD to inform that pt is refusing last two doses of IV potassium. Pt has received two doses. Potassium is diluted with normal saline, potassium rate slowed down and provided pt with a heat pack. Pt states it is too painful to continue. Awaiting call back from MD

## 2017-07-11 NOTE — Progress Notes (Signed)
Called IR in regards to pt procedure today IR did not answer  Called CT in regards to order  CT stated they will call RN back

## 2017-07-11 NOTE — Progress Notes (Signed)
CC:  Abdominal pain  Subjective: She is still having pain, CT repeated today, but still not read. [place] wait until tomorrow to do drain placement if still needed.    Objective: Vital signs in last 24 hours: Temp:  [97.7 F (36.5 C)-99 F (37.2 C)] 98.6 F (37 C) (11/05 1417) Pulse Rate:  [59-68] 68 (11/05 1417) Resp:  [20] 20 (11/05 1417) BP: (153-164)/(54-67) 153/56 (11/05 1417) SpO2:  [90 %-94 %] 91 % (11/05 1417) Weight:  [96.6 kg (212 lb 14.4 oz)] 96.6 kg (212 lb 14.4 oz) (11/05 0504) Last BM Date: 07/09/17 820 PO 200 IV 1100 urine BM x 1 today Afebrile, VSS WBC 7.1 K+ 3.1 Intake/Output from previous day: 11/04 0701 - 11/05 0700 In: 1020 [P.O.:820; IV Piggyback:200] Out: 1100 [Urine:1100] Intake/Output this shift: Total I/O In: 50 [IV Piggyback:50] Out: 800 [Urine:800]  General appearance: alert, cooperative, no distress and still having pain LLQ going into back.  Lab Results:  Recent Labs    07/11/17 0359  WBC 7.7  HGB 9.9*  HCT 31.1*  PLT 254    BMET Recent Labs    07/10/17 0336 07/11/17 0359  NA 138 137  K 3.4* 3.1*  CL 97* 96*  CO2 31 30  GLUCOSE 94 94  BUN 11 11  CREATININE 1.05* 1.00  CALCIUM 9.0 8.9   PT/INR Recent Labs    07/10/17 0336 07/11/17 0359  LABPROT 22.6* 20.4*  INR 2.01 1.76    Recent Labs  Lab 07/06/17 1510 07/06/17 1855  AST 19 22  ALT 11 15  ALKPHOS  --  79  BILITOT 1.2 1.4*  PROT 7.0 7.9  ALBUMIN  --  3.6     Lipase     Component Value Date/Time   LIPASE 70 (H) 02/26/2017 0252     Medications: . cloNIDine  0.1 mg Oral BID  . feeding supplement  1 Container Oral TID BM  . loratadine  10 mg Oral Daily  . losartan  100 mg Oral Daily  . metoprolol succinate  50 mg Oral Daily  . mirtazapine  30 mg Oral QHS  . montelukast  10 mg Oral QHS  . pantoprazole  40 mg Oral Daily  . spironolactone  12.5 mg Oral Daily  . torsemide  60 mg Oral Daily  . triamcinolone cream  1 application Topical BID   .  piperacillin-tazobactam (ZOSYN)  IV Stopped (07/11/17 1352)   Anti-infectives (From admission, onward)   Start     Dose/Rate Route Frequency Ordered Stop   07/07/17 1000  piperacillin-tazobactam (ZOSYN) IVPB 3.375 g     3.375 g 12.5 mL/hr over 240 Minutes Intravenous Every 8 hours 07/07/17 0902     07/07/17 0800  piperacillin-tazobactam (ZOSYN) IVPB 3.375 g  Status:  Discontinued     3.375 g 12.5 mL/hr over 240 Minutes Intravenous Every 8 hours 07/07/17 0217 07/07/17 0902   07/07/17 0130  piperacillin-tazobactam (ZOSYN) IVPB 3.375 g     3.375 g 100 mL/hr over 30 Minutes Intravenous  Once 07/07/17 0125 07/07/17 0335      Assessment/Plan Pelvic abscess/most likely diverticulitis. History of colon cancer with right hemicolectomy and chemotherapy, 2003 History of left breast cancer with lumpectomy and chemotherapy. CAD/history of Takotsubo cardiomyopathy now resolved/chronic AF on coumadin INR 1.76 this AM COPD/history of pulmonary hypertension Hyperlipidemia Hypertension History of GERD/hiatal hernia. FEN:  NPO/IV fluids ID: Zosyn 11/1 =>> day 5 DVT:  SCD's Foley:  None Follow up to be determined;  Drain per  Dr. Donne Hazel    Plan:  Ongoing antibiotics, await IR decision on the drain.     LOS: 4 days    Khyson Sebesta 07/11/2017 [phone]

## 2017-07-11 NOTE — Progress Notes (Signed)
Patient ID: [name], female   DOB: May 31, 1935, 82 y.o.   MRN: [id number]   INR 1.76 today--- Hopefully plan for today Will confirm with Rad

## 2017-07-11 NOTE — Progress Notes (Signed)
  PROGRESS NOTE  [name] RDE:[id number] DOB: 01-11-1935 DOA: 07/07/2017 PCP: Orlena Sheldon, PA-C  Brief Narrative: 18yow PMH diverticulitis, colon cancer s/p partial colectomy, XRT, presented with abd pain. CT showed diverticulitis with complex adnexal abscess. Admitted for pelvic abscess.  Assessment/Plan Pelvic abscess, left, epicenter left adnexum, suspected complicated diverticulitis with abscess involing left ovary and extending into endometrial cavity - overall remains stable, afebrile,  - continue IV abx today  - INR now below 2. IR has reordered CT to f/u abscess. Await drain placement.  Hyponatremia, hypochloridemia, dehydration, complicated by diuretics - resolved with IVF  Acute hypoxic resp failure with h/o COPD, pulmonary HTN -resolved  COPD - remains stable  CAD - remains asymptomatic  Permanent atrial fibrillation. CHA2DS2-VASc Score is 6 - resume warfarin after procedures - continue metoprolol 63/1  Chronic diastolic CHF - stable - continue torsemide, spironolactone 11/5 - continue metoprolol 11/5  Moderate to severe pulm HTN  DVT prophylaxis: INR > 2 Code Status: full Family Communication: husband at bedside 11/5 Disposition Plan: home    Susan Hodgkins, MD  [facility] Direct contact: [phone] --[place] [place]  --[email or web address]; password [email or web address]  7PM-7AM contact night coverage as above 07/11/2017, 12:40 PM  LOS: 4 days   Consultants:  IR  General surgery  Procedures:    Antimicrobials:  Zosyn 10/31 >>  Interval history/Subjective: Feels ok but has intermittent abdominal pain  Objective: Vitals:  Vitals:   07/11/17 0504 07/11/17 0900  BP: (!) 162/60 (!) 164/54  Pulse: (!) 59 61  Resp: 20   Temp: 98.8 F (37.1 C) 99 F (37.2 C)  SpO2: 93% 90%    Exam:    Constitutional: . Appears calm and mildly uncomfortable Respiratory:  . CTA bilaterally, no w/r/r.  . Respiratory effort normal. Cardiovascular:  . RRR,  no m/r/g . trace BLE extremity edema   Abdomen:  . Soft, mildly tender to palpitation Psychiatric:  . Mental status o Mood, affect appropriate  I have personally reviewed the following:   Labs:  BMP unremarkable except K+ 3.1  INR 2.36 >> 2.1 >> 2.01 >> 1.76  CBG stable  Hgb stable   Scheduled Meds: . cloNIDine  0.1 mg Oral BID  . feeding supplement  1 Container Oral TID BM  . loratadine  10 mg Oral Daily  . losartan  100 mg Oral Daily  . metoprolol succinate  50 mg Oral Daily  . mirtazapine  30 mg Oral QHS  . montelukast  10 mg Oral QHS  . pantoprazole  40 mg Oral Daily  . spironolactone  12.5 mg Oral Daily  . torsemide  60 mg Oral Daily  . triamcinolone cream  1 application Topical BID   Continuous Infusions: . piperacillin-tazobactam (ZOSYN)  IV 3.375 g (07/11/17 1016)    Principal Problem:   Pelvic abscess in female Active Problems:   GERD (gastroesophageal reflux disease)   Hypertension   COPD (chronic obstructive pulmonary disease) ([facility])   Coronary artery disease   Permanent atrial fibrillation ([facility])   Chronic anticoagulation   Chronic diastolic CHF (congestive heart failure) ([place])   LOS: 4 days

## 2017-07-11 NOTE — Progress Notes (Signed)
INR 1.76 today.  IR MD would like a repeat CT scan prior to moving forward since her last scan was around 5 days ago to confirm this area still needs a drain.  If so, and INR lower, then hopefully we can proceed in the coming day (s).  Roland Prine E 9:09 AM 07/11/2017

## 2017-07-11 NOTE — Progress Notes (Signed)
MD aware pt did not receive last two runs of IV potassium  MD aware pt just transferred down to CT  MD stated will wait for diet order until CT results

## 2017-07-11 NOTE — Care Management Important Message (Signed)
Important Message  Patient Details  Name: [name] MRN: [id number] Date of Birth: [birth year]   Medicare Important Message Given:  Yes    Samiel Peel 07/11/2017, 1:29 PM

## 2017-07-12 ENCOUNTER — Inpatient Hospital Stay (HOSPITAL_COMMUNITY): Payer: Medicare Other

## 2017-07-12 ENCOUNTER — Other Ambulatory Visit: Payer: Self-pay

## 2017-07-12 LAB — CULTURE, BLOOD (ROUTINE X 2)
CULTURE: NO GROWTH
Culture: NO GROWTH
SPECIAL REQUESTS: ADEQUATE
SPECIAL REQUESTS: ADEQUATE

## 2017-07-12 LAB — GLUCOSE, CAPILLARY: GLUCOSE-CAPILLARY: 89 mg/dL (ref 65–99)

## 2017-07-12 LAB — PROTIME-INR
INR: 1.37
Prothrombin Time: 16.8 seconds — ABNORMAL HIGH (ref 11.4–15.2)

## 2017-07-12 MED ORDER — SODIUM CHLORIDE 0.9% FLUSH
5.0000 mL | Freq: Three times a day (TID) | INTRAVENOUS | Status: DC
  Administered 2017-07-12 – 2017-07-16 (×8): 5 mL via INTRAVENOUS

## 2017-07-12 MED ORDER — MIDAZOLAM HCL 2 MG/2ML IJ SOLN
INTRAMUSCULAR | Status: AC | PRN
  Administered 2017-07-12: 0.5 mg via INTRAVENOUS
  Administered 2017-07-12: 1 mg via INTRAVENOUS

## 2017-07-12 MED ORDER — SODIUM CHLORIDE 0.9 % IV SOLN
INTRAVENOUS | Status: AC | PRN
  Administered 2017-07-12: 10 mL/h via INTRAVENOUS

## 2017-07-12 MED ORDER — FENTANYL CITRATE (PF) 100 MCG/2ML IJ SOLN
INTRAMUSCULAR | Status: AC
  Filled 2017-07-12: qty 4

## 2017-07-12 MED ORDER — FENTANYL CITRATE (PF) 100 MCG/2ML IJ SOLN
INTRAMUSCULAR | Status: AC | PRN
  Administered 2017-07-12: 25 ug via INTRAVENOUS
  Administered 2017-07-12: 50 ug via INTRAVENOUS

## 2017-07-12 MED ORDER — MIDAZOLAM HCL 2 MG/2ML IJ SOLN
INTRAMUSCULAR | Status: AC
  Filled 2017-07-12: qty 4

## 2017-07-12 NOTE — Progress Notes (Addendum)
PROGRESS NOTE  [name] [place] WJX:[id number] DOB: [birth year] DOA: 07/07/2017 PCP: Orlena Sheldon, PA-C  Brief Narrative: 83yow PMH diverticulitis, colon cancer s/p partial colectomy, XRT, presented with abd pain. CT showed diverticulitis with complex adnexal abscess. Admitted for pelvic abscess.  Assessment/Plan Pelvic abscess, left, epicenter left adnexum, suspected complicated diverticulitis with abscess involing left ovary and extending into endometrial cavity. Patient reports intermittent vaginal discharge. - afebrile, VSS, now s/p drain 11/6. Surgery following. - discussed imaging with Dr. Nehemiah Settle on call for GYN, reviewed films. Will discuss with his partners for further recs - likely home next 48 hours if no further procedures  Hyponatremia, hypochloridemia, dehydration, complicated by diuretics - resolved with IVF  Acute hypoxic resp failure with h/o COPD, pulmonary HTN -resolved  COPD - stable  CAD -  asymptomatic  Permanent atrial fibrillation. CHA2DS2-VASc Score is 6 - resume warfarin after procedures - continue metoprolol 21/3  Chronic diastolic CHF - stable - continue torsemide, spironolactone 11/6 - continue metoprolol 11/6  Moderate to severe pulm HTN  DVT prophylaxis: SCDs Code Status: full Family Communication:   Disposition Plan: home    Murray Hodgkins, MD  [facility] Direct contact: [phone] --Via amion app [place]  --[email or web address]; password [email or web address]  7PM-7AM contact night coverage as above 07/12/2017, 11:46 AM  LOS: 5 days   Consultants:  IR  General surgery  Procedures:  11/6 CT LLQ pelvic abscess drain placement 70f  Antimicrobials:  Zosyn 10/31 >>  Interval history/Subjective: S/p drain placement  Feels sore at drain placement. Tolerating liquids. Reports some vaginal discharge intermittently, unclear how long.  Objective: Vitals:  Vitals:   07/12/17 1101 07/12/17 1103  BP: (!) 182/55   Pulse:  63  Resp:    Temp:      SpO2:      Exam: Constitutional:   . Appears calm, mildly uncomfortable Respiratory:  . CTA bilaterally, no w/r/r.  . Respiratory effort normal.  Cardiovascular:  . RRR, no m/r/g . No LE extremity edema   Abdomen:  . soft Psychiatric:  . Mental status o Mood, affect appropriate  I have personally reviewed the following:   UOP: 1850 I/O since admission: incomplete Last BM charted: 11/5 Foley: no Telemetry: no Status: med  Labs:  CBG stable  Repeat CT abd/pelvis noted   Scheduled Meds: . cloNIDine  0.1 mg Oral BID  . feeding supplement  1 Container Oral TID BM  . fentaNYL      . loratadine  10 mg Oral Daily  . losartan  100 mg Oral Daily  . metoprolol succinate  50 mg Oral Daily  . midazolam      . mirtazapine  30 mg Oral QHS  . montelukast  10 mg Oral QHS  . pantoprazole  40 mg Oral Daily  . sodium chloride flush  5 mL Intravenous Q8H  . spironolactone  12.5 mg Oral Daily  . torsemide  60 mg Oral Daily  . triamcinolone cream  1 application Topical BID   Continuous Infusions: . piperacillin-tazobactam (ZOSYN)  IV 3.375 g (07/12/17 1141)    Principal Problem:   Pelvic abscess in female Active Problems:   GERD (gastroesophageal reflux disease)   Hypertension   COPD (chronic obstructive pulmonary disease) ([facility])   Coronary artery disease   Permanent atrial fibrillation ([facility])   Chronic anticoagulation   Chronic diastolic CHF (congestive heart failure) ([place])   LOS: 5 days    Time >35 minutes in counseling and coordination of care

## 2017-07-12 NOTE — Progress Notes (Signed)
OB/Gyn Note  Patient Name: [name], female   DOB: 10-07-1934, 82 y.o.  MRN: [id number]  Called by Dr Sarajane Jews for phone consult on this patient: 82yo woman admitted for diverticulitis with 5.2 x 3.3 cm adnexal abscess, favored to be diverticular in nature. Patient placed on Zosyn for past 5 days. IR placed perc drain today. CT also showed 3cm x 2cm fluid collection in uterus. At this point, patient is not in need of emergent evaluation for the uterine fluid collection - this should be reevaluated by Gyn following her hospitalization.   If the patient begins to have increasing pelvic pain or appears to not be responding appropriately to the antibiotic regimen, please let us know and we would be happy to formally consult on this patient.   Truett Mainland, DO  07/12/2017 1:28 PM

## 2017-07-12 NOTE — Sedation Documentation (Addendum)
Dr Vernard Gambles at bedside.  Awaiting CT availability.

## 2017-07-12 NOTE — Progress Notes (Signed)
Patient ID: [name], female   DOB: [birth year], 82 y.o.   MRN: [id number]  [facility] Surgery Progress Note     Subjective: CC- sore Sitting up in bed eating lunch. Pelvic drain placed this morning. Patient states that she is sore from the drain, but otherwise abdomen is doing ok. Denies n/v. States that she had a loose BM this morning.  Objective: Vital signs in last 24 hours: Temp:  [98.1 F (36.7 C)-98.6 F (37 C)] 98.1 F (36.7 C) (11/06 0521) Pulse Rate:  [57-85] 63 (11/06 1103) Resp:  [14-20] 16 (11/06 1010) BP: (153-194)/(53-79) 182/55 (11/06 1101) SpO2:  [91 %-99 %] 95 % (11/06 1010) Weight:  [207 lb 8 oz (94.1 kg)] 207 lb 8 oz (94.1 kg) (11/06 0521) Last BM Date: 07/11/17  Intake/Output from previous day: 11/05 0701 - 11/06 0700 In: 340 [P.O.:240; IV Piggyback:100] Out: 1850 [Urine:1850] Intake/Output this shift: Total I/O In: 5 [Other:5] Out: 5 [Drains:5]  PE: Gen:  Alert, NAD, pleasant HEENT: EOM's intact, pupils equal and round Card:  Irregularly irregular Pulm:  CTAB, no W/R/R, effort normal Abd: Soft, ND, +BS, no HSM, no hernia, drain LLQ with purulent/pink tinged fluid in bag, TTP around drain Psych: A&Ox3  Skin: no rashes noted, warm and dry  Lab Results:  Recent Labs    07/11/17 0359  WBC 7.7  HGB 9.9*  HCT 31.1*  PLT 254   BMET Recent Labs    07/10/17 0336 07/11/17 0359  NA 138 137  K 3.4* 3.1*  CL 97* 96*  CO2 31 30  GLUCOSE 94 94  BUN 11 11  CREATININE 1.05* 1.00  CALCIUM 9.0 8.9   PT/INR Recent Labs    07/11/17 0359 07/12/17 0610  LABPROT 20.4* 16.8*  INR 1.76 1.37   CMP     Component Value Date/Time   NA 137 07/11/2017 0359   NA 139 05/30/2017 1039   NA 140 08/08/2014 0951   K 3.1 (L) 07/11/2017 0359   K 4.2 08/08/2014 0951   CL 96 (L) 07/11/2017 0359   CL 102 02/09/2013 0947   CO2 30 07/11/2017 0359   CO2 26 08/08/2014 0951   GLUCOSE 94 07/11/2017 0359   GLUCOSE 119 08/08/2014 0951   GLUCOSE 153 (H)  02/09/2013 0947   BUN 11 07/11/2017 0359   BUN 19 05/30/2017 1039   BUN 14.4 08/08/2014 0951   CREATININE 1.00 07/11/2017 0359   CREATININE 1.04 (H) 07/06/2017 1510   CREATININE 0.8 08/08/2014 0951   CALCIUM 8.9 07/11/2017 0359   CALCIUM 9.8 08/08/2014 0951   PROT 7.9 07/06/2017 1855   PROT 7.5 02/01/2017 0941   PROT 7.8 08/08/2014 0951   ALBUMIN 3.6 07/06/2017 1855   ALBUMIN 4.3 02/01/2017 0941   ALBUMIN 3.8 08/08/2014 0951   AST 22 07/06/2017 1855   AST 21 08/08/2014 0951   ALT 15 07/06/2017 1855   ALT 18 08/08/2014 0951   ALKPHOS 79 07/06/2017 1855   ALKPHOS 92 08/08/2014 0951   BILITOT 1.4 (H) 07/06/2017 1855   BILITOT 0.4 02/01/2017 0941   BILITOT 0.49 08/08/2014 0951   GFRNONAA 52 (L) 07/11/2017 0359   GFRNONAA 51 (L) 07/06/2017 1510   GFRAA >60 07/11/2017 0359   GFRAA 59 (L) 07/06/2017 1510   Lipase     Component Value Date/Time   LIPASE 70 (H) 02/26/2017 0252       Studies/Results: Ct Abdomen Pelvis Wo Contrast  Result Date: 07/11/2017 CLINICAL DATA:  History of left pelvic abscess,  previously not amenable to percutaneous drainage. Evaluate for interval enlargement. EXAM: CT ABDOMEN AND PELVIS WITHOUT CONTRAST TECHNIQUE: Multidetector CT imaging of the abdomen and pelvis was performed following the standard protocol without IV contrast. COMPARISON:  CT abdomen pelvis - 07/06/2017 ; 04/27/2017; 03/24/2017 FINDINGS: The lack of intravenous contrast limits the ability to evaluate solid abdominal organs. Lower chest: Limited visualization of the lower thorax demonstrates minimal dependent subsegmental atelectasis within the medial basilar segment of the right lower lobe. No focal airspace opacities. No pleural effusion. Cardiomegaly. No pericardial effusion. Calcifications within the mitral valve annulus. Hepatobiliary: There is mild nodularity hepatic contour suggestive of cirrhotic change. There is a minimal amount of focal fatty infiltration adjacent to the fissure  filling and thin teres. Post cholecystectomy. No ascites. Pancreas: Normal noncontrast appearance of the pancreas Spleen: Multiple punctate granuloma are seen within an otherwise normal-appearing spleen. Adrenals/Urinary Tract: Normal noncontrast appearance the bilateral kidneys. No renal stones. No renal stones are seen along expected course of either ureter or the urinary bladder. No evidence of urinary obstruction. Normal noncontrast appearance the bilateral adrenal glands. Normal appearance of the urinary bladder given degree distention. Stomach/Bowel: Re- demonstrated air and fluid containing structure centered within the left adnexa with dominant component measuring approximately 5.2 x 3.3 cm, previously, 5.4 x 3.2 cm. This structure is again associated with adjacent colonic diverticulosis and minimal amount of pericolonic stranding. Re- demonstrated fluid seen within the endometrial canal with dominant component measuring approximately 3.3 x 2.5 cm. No new or enlarging abdominal or pelvic fluid collections. Stable sequela of prior right hemicolectomy. No evidence of enteric obstruction. No pneumoperitoneum, pneumatosis or portal venous gas. Vascular/Lymphatic: Atherosclerotic plaque with a normal caliber abdominal aorta. Scattered retroperitoneal and mesenteric lymph nodes are numerous though individually not enlarged by size criteria with index aortocaval lymph node measuring 0.9 cm in greatest short axis diameter (image 35, series 3), presumably reactive. No enlarging bulky retroperitoneal, mesenteric, pelvic or inguinal lymphadenopathy. Reproductive: As detailed in the stomach/bowel section above. Other: There is a minimal subcutaneous edema about the midline of the low back. Musculoskeletal: Moderate severe multilevel lumbar spine DDD, worse at L1-L2 and L5-S1 with disc space height loss, endplate irregularity and sclerosis. Stigmata of DISH with the caudal aspect of the thoracic spine. Degenerative  change of the pubic symphysis. IMPRESSION: 1. Grossly unchanged size and appearance of approximately 5.2 cm left adnexal air and fluid collection, incompletely characterized on this noncontrast examination, though again favored to represent secondary involvement of the left adnexa due to adjacent diverticular disease. Given persistence, this structure Farrier be amenable to CT-guided percutaneous aspiration and/or drainage catheter placement as clinically indicated. 2. Fluid is again seen within the endometrial canal, an abnormal finding in this postmenopausal patient and given suspected infectious process within the left adnexa, concomitant infection of the endometrium is not excluded on the basis this examination. Correlation for vaginal discharge is recommended. Further evaluation with pelvic examination could be performed as indicated. 3. Colonic diverticulosis without evidence of worsening diverticulitis. No new or enlarging fluid collections within the abdomen or pelvis. 4. Post right hemicolectomy without evidence of enteric obstruction. 5. Nodularity hepatic contour suggestive of early cirrhotic change. Correlation LFTs is recommended. 6. Sequela of prior granulomatous disease as above. 7.  Aortic Atherosclerosis (ICD10-I70.0). Electronically Signed   By: Sandi Mariscal M.D.   On: 07/11/2017 17:02   Ct Image Guided Drainage By Percutaneous Catheter  Result Date: 07/12/2017 CLINICAL DATA:  Persistent left pelvic diverticular abscess. EXAM: CT GUIDED DRAINAGE  OF LEFT PELVIC ABSCESS ANESTHESIA/SEDATION: Intravenous Fentanyl and Versed were administered as conscious sedation during continuous monitoring of the patient's level of consciousness and physiological / cardiorespiratory status by the radiology RN, with a total moderate sedation time of 12 minutes. PROCEDURE: The procedure, risks, benefits, and alternatives were explained to the patient. Questions regarding the procedure were encouraged and answered. The  patient understands and consents to the procedure. Select axial scans through the pelvis obtained. The left collection was localized, corresponding to that seen on prior CT, and an appropriate skin entry site determined and marked. The operative field was prepped with chlorhexidinein a sterile fashion, and a sterile drape was applied covering the operative field. A sterile gown and sterile gloves were used for the procedure. Local anesthesia was provided with 1% Lidocaine. Under CT fluoroscopic guidance, 18 gauge trocar needle advanced to the collection. Purulent material could be aspirated. Amplatz guidewire advanced easily within the collection, position confirmed on CT. Tract dilated to facilitate placement of a 10 French pigtail catheter, formed centrally within the collection. 5 mL of greenish purulent aspirate were sent for Gram stain and culture. Catheter secured externally with 0 Prolene suture, flushed, and placed to gravity drain bag. The patient tolerated the procedure well. COMPLICATIONS: None immediate FINDINGS: Loculated gas and fluid collection in the left pelvis again localized. Percutaneous drain catheter placed as above. Sample of the aspirate sent for Gram stain and culture. IMPRESSION: 1. Technically successful CT-guided left pelvic abscess drain catheter placement. Electronically Signed   By: Lucrezia Europe M.D.   On: 07/12/2017 10:31    Anti-infectives: Anti-infectives (From admission, onward)   Start     Dose/Rate Route Frequency Ordered Stop   07/07/17 1000  piperacillin-tazobactam (ZOSYN) IVPB 3.375 g     3.375 g 12.5 mL/hr over 240 Minutes Intravenous Every 8 hours 07/07/17 0902     07/07/17 0800  piperacillin-tazobactam (ZOSYN) IVPB 3.375 g  Status:  Discontinued     3.375 g 12.5 mL/hr over 240 Minutes Intravenous Every 8 hours 07/07/17 0217 07/07/17 0902   07/07/17 0130  piperacillin-tazobactam (ZOSYN) IVPB 3.375 g     3.375 g 100 mL/hr over 30 Minutes Intravenous  Once 07/07/17  0125 07/07/17 0335       Assessment/Plan History of colon cancer with right hemicolectomy and chemotherapy,2003 History of left breast cancer with lumpectomy and chemotherapy. CAD Atrial Fibrillation on coumadin - INR 1.37 this AM, holding coumadin Chronic dCHF - torsemide, spironolactone, metoprolol COPD/history of pulmonary HTN HLD HTN H/o GERD/hiatal hernia. Pelvic abscess/most likely diverticulitis - s/p pelvic drain placement 11/6 Dr. Vernard Gambles - culture pending  FEN:  IVF, FLD, protein shakes ID: Zosyn 11/1 >>day#6 DVT:  SCD's Foley:  None Follow up:  TBD  Plan: Continue drain and IV antibiotics. Follow culture.   LOS: 5 days    [name] Hampshire , [facility] Surgery 07/12/2017, 12:56 PM Pager: [phone] Consults: [phone] Mon-Fri 7:00 am-4:30 pm Sat-Sun 7:00 am-11:30 am

## 2017-07-12 NOTE — Sedation Documentation (Addendum)
Patient is resting comfortably. 

## 2017-07-12 NOTE — Sedation Documentation (Signed)
Patient is resting comfortably. 

## 2017-07-12 NOTE — Sedation Documentation (Signed)
Patient is resting comfortably. Having 3/10 pain across lower abdomen. It was a 3/10 prior to procedure

## 2017-07-12 NOTE — Sedation Documentation (Signed)
When giving second dose of sedation, noted IV had leaked. Second dose given and no further leakage noted. Will let her nurse know. Not sure how much sedation she received with first dose. Dr Vernard Gambles aware.

## 2017-07-12 NOTE — Procedures (Signed)
  Procedure: CT LLQ pelvic abscess drain placement 60f Preprocedure diagnosis: Diverticular abscess Postprocedure diagnosis: same EBL:   minimal Complications:  none immediate  See full dictation in [employer].  Dillard Cannon MD Main # [phone] Pager  [phone]

## 2017-07-13 DIAGNOSIS — N739 Female pelvic inflammatory disease, unspecified: Secondary | ICD-10-CM

## 2017-07-13 LAB — GLUCOSE, CAPILLARY: Glucose-Capillary: 95 mg/dL (ref 65–99)

## 2017-07-13 LAB — CBC
HCT: 34.2 % — ABNORMAL LOW (ref 36.0–46.0)
Hemoglobin: 10.9 g/dL — ABNORMAL LOW (ref 12.0–15.0)
MCH: 29.1 pg (ref 26.0–34.0)
MCHC: 31.9 g/dL (ref 30.0–36.0)
MCV: 91.4 fL (ref 78.0–100.0)
Platelets: 319 10*3/uL (ref 150–400)
RBC: 3.74 MIL/uL — ABNORMAL LOW (ref 3.87–5.11)
RDW: 14 % (ref 11.5–15.5)
WBC: 7.2 10*3/uL (ref 4.0–10.5)

## 2017-07-13 LAB — PROTIME-INR
INR: 1.24
Prothrombin Time: 15.5 seconds — ABNORMAL HIGH (ref 11.4–15.2)

## 2017-07-13 MED ORDER — WARFARIN - PHARMACIST DOSING INPATIENT: Freq: Every day | Status: DC

## 2017-07-13 MED ORDER — WARFARIN SODIUM 5 MG PO TABS
5.0000 mg | ORAL_TABLET | Freq: Once | ORAL | Status: AC
  Administered 2017-07-13: 5 mg via ORAL
  Filled 2017-07-13: qty 1

## 2017-07-13 MED ORDER — POTASSIUM CHLORIDE CRYS ER 20 MEQ PO TBCR
40.0000 meq | EXTENDED_RELEASE_TABLET | Freq: Once | ORAL | Status: AC
  Administered 2017-07-13: 40 meq via ORAL
  Filled 2017-07-13: qty 2

## 2017-07-13 NOTE — Progress Notes (Signed)
Referring Physician(s): Dr. Marthenia Rolling  Supervising Physician: Marybelle Killings  Patient Status:  [facility] - In-pt  Chief Complaint: Intra-abdominal abscess  Subjective: Soreness at drain site, but abdominal pain improved.   Allergies: Contrast media [iodinated diagnostic agents]; Clonidine derivatives; Statins; Sulfa antibiotics; Celebrex [celecoxib]; Isosorbide nitrate; Other; and Tape  Medications: Prior to Admission medications   Medication Sig Start Date End Date Taking? Authorizing Provider  acetaminophen (TYLENOL) 325 MG tablet Take 650 mg by mouth every 6 (six) hours as needed for mild pain. Reported on 03/15/2016   Yes [provider]  amLODipine (NORVASC) 5 MG tablet Take 1 tablet (5 mg total) by mouth daily. 03/25/17  Yes Clegg, Amy D, NP  cetirizine (ZYRTEC) 10 MG tablet Take 10 mg by mouth daily as needed for allergies.   Yes [provider]  clonazePAM (KLONOPIN) 0.5 MG tablet Take 1 tablet (0.5 mg total) by mouth 2 (two) times daily as needed for anxiety. 04/04/17  Yes Dena Billet B, PA-C  cloNIDine (CATAPRES) 0.1 MG tablet TAKE ONE TABLET BY MOUTH TWICE DAILY 12/14/16  Yes Turner, Eber Hong, MD  guaiFENesin (MUCINEX) 600 MG 12 hr tablet Take 600 mg by mouth daily as needed for cough or to loosen phlegm.   Yes [provider]  HYDROcodone-acetaminophen (NORCO) 7.5-325 MG tablet Take 1 tablet by mouth every 6 (six) hours as needed for moderate pain. 04/25/17  Yes Dena Billet B, PA-C  losartan (COZAAR) 100 MG tablet Take 1 tablet (100 mg total) by mouth daily. 03/26/17  Yes Clegg, Amy D, NP  metoprolol succinate (TOPROL-XL) 50 MG 24 hr tablet TAKE ONE TABLET BY MOUTH ONCE DAILY TAKE  WITH  OR  FOLLOWING  A  MEAL 10/26/16  Yes Dena Billet B, PA-C  mirtazapine (REMERON) 30 MG tablet Take 1 tablet (30 mg total) by mouth at bedtime. 05/04/17  Yes Dena Billet B, PA-C  montelukast (SINGULAIR) 10 MG tablet Take 1 tablet (10 mg total) by mouth at bedtime. Patient taking  differently: Take 10 mg by mouth at bedtime as needed (for allergies).  04/28/16  Yes Orlena Sheldon, PA-C  nitroGLYCERIN (NITROSTAT) 0.4 MG SL tablet Place 1 tablet (0.4 mg total) under the tongue every 5 (five) minutes as needed for chest pain (x 3 pills). 10/23/15  Yes Orlena Sheldon, PA-C  omeprazole (PRILOSEC) 20 MG capsule Take 1 capsule (20 mg total) by mouth daily as needed (heartburn, acid reflux). 09/25/15  Yes Dixon, Mary B, PA-C  OVER THE COUNTER MEDICATION Place 1 drop into both eyes daily as needed (dry eyes). Eye drops   Yes [provider]  spironolactone (ALDACTONE) 25 MG tablet TAKE ONE-HALF TABLET BY MOUTH ONCE DAILY 12/28/16  Yes Turner, Eber Hong, MD  torsemide (DEMADEX) 20 MG tablet Take 60 mg by mouth daily.    Yes [provider]  triamcinolone cream (KENALOG) 0.1 % Apply 1 application topically 2 (two) times daily. 12/31/16  Yes , Modena Nunnery, MD  warfarin (COUMADIN) 1 MG tablet Take 0.5 tablets (0.5 mg total) by mouth as directed. M-W-F-SA Patient taking differently: Take 3 mg by mouth See admin instructions. Mon/Wed/Fri/Sat 07/08/16  Yes Orlena Sheldon, PA-C  warfarin (COUMADIN) 5 MG tablet Take 0.5 tablets (2.5 mg total) by mouth See admin instructions. Take half a tablet (2.5 mg) on Tuesdays, Thursdays, and Sundays. Take half a tablet with half a tablet of 1 mg (total 3 mg) on all other days. Patient taking differently: Take 2.5 mg by  mouth See admin instructions. Take 1/2 tablet  on Tuesdays, Thursdays, and Sundays. 07/08/16  Yes Dena Billet B, PA-C     Vital Signs: BP 139/63 (BP Location: Right Arm)   Pulse 64   Temp 98 F (36.7 C) (Oral)   Resp 18   Ht 5\' 7"  (1.702 m)   Wt 207 lb 3.2 oz (94 kg) Comment: scale b  SpO2 93%   BMI 32.45 kg/m   Physical Exam  NAD, alert Abd:  LLQ drain in place.  Insertion site c/d/i.  Blood-tinged output in bag. Only 25 mL recorded overnight.   Imaging: Ct Abdomen Pelvis Wo Contrast  Result Date:  07/11/2017 CLINICAL DATA:  History of left pelvic abscess, previously not amenable to percutaneous drainage. Evaluate for interval enlargement. EXAM: CT ABDOMEN AND PELVIS WITHOUT CONTRAST TECHNIQUE: Multidetector CT imaging of the abdomen and pelvis was performed following the standard protocol without IV contrast. COMPARISON:  CT abdomen pelvis - 07/06/2017 ; 04/27/2017; 03/24/2017 FINDINGS: The lack of intravenous contrast limits the ability to evaluate solid abdominal organs. Lower chest: Limited visualization of the lower thorax demonstrates minimal dependent subsegmental atelectasis within the medial basilar segment of the right lower lobe. No focal airspace opacities. No pleural effusion. Cardiomegaly. No pericardial effusion. Calcifications within the mitral valve annulus. Hepatobiliary: There is mild nodularity hepatic contour suggestive of cirrhotic change. There is a minimal amount of focal fatty infiltration adjacent to the fissure filling and thin teres. Post cholecystectomy. No ascites. Pancreas: Normal noncontrast appearance of the pancreas Spleen: Multiple punctate granuloma are seen within an otherwise normal-appearing spleen. Adrenals/Urinary Tract: Normal noncontrast appearance the bilateral kidneys. No renal stones. No renal stones are seen along expected course of either ureter or the urinary bladder. No evidence of urinary obstruction. Normal noncontrast appearance the bilateral adrenal glands. Normal appearance of the urinary bladder given degree distention. Stomach/Bowel: Re- demonstrated air and fluid containing structure centered within the left adnexa with dominant component measuring approximately 5.2 x 3.3 cm, previously, 5.4 x 3.2 cm. This structure is again associated with adjacent colonic diverticulosis and minimal amount of pericolonic stranding. Re- demonstrated fluid seen within the endometrial canal with dominant component measuring approximately 3.3 x 2.5 cm. No new or enlarging  abdominal or pelvic fluid collections. Stable sequela of prior right hemicolectomy. No evidence of enteric obstruction. No pneumoperitoneum, pneumatosis or portal venous gas. Vascular/Lymphatic: Atherosclerotic plaque with a normal caliber abdominal aorta. Scattered retroperitoneal and mesenteric lymph nodes are numerous though individually not enlarged by size criteria with index aortocaval lymph node measuring 0.9 cm in greatest short axis diameter (image 35, series 3), presumably reactive. No enlarging bulky retroperitoneal, mesenteric, pelvic or inguinal lymphadenopathy. Reproductive: As detailed in the stomach/bowel section above. Other: There is a minimal subcutaneous edema about the midline of the low back. Musculoskeletal: Moderate severe multilevel lumbar spine DDD, worse at L1-L2 and L5-S1 with disc space height loss, endplate irregularity and sclerosis. Stigmata of DISH with the caudal aspect of the thoracic spine. Degenerative change of the pubic symphysis. IMPRESSION: 1. Grossly unchanged size and appearance of approximately 5.2 cm left adnexal air and fluid collection, incompletely characterized on this noncontrast examination, though again favored to represent secondary involvement of the left adnexa due to adjacent diverticular disease. Given persistence, this structure Schowalter be amenable to CT-guided percutaneous aspiration and/or drainage catheter placement as clinically indicated. 2. Fluid is again seen within the endometrial canal, an abnormal finding in this postmenopausal patient and given suspected infectious process within the left adnexa,  concomitant infection of the endometrium is not excluded on the basis this examination. Correlation for vaginal discharge is recommended. Further evaluation with pelvic examination could be performed as indicated. 3. Colonic diverticulosis without evidence of worsening diverticulitis. No new or enlarging fluid collections within the abdomen or pelvis. 4. Post  right hemicolectomy without evidence of enteric obstruction. 5. Nodularity hepatic contour suggestive of early cirrhotic change. Correlation LFTs is recommended. 6. Sequela of prior granulomatous disease as above. 7.  Aortic Atherosclerosis (ICD10-I70.0). Electronically Signed   By: Sandi Mariscal M.D.   On: 07/11/2017 17:02   Ct Image Guided Drainage By Percutaneous Catheter  Result Date: 07/12/2017 CLINICAL DATA:  Persistent left pelvic diverticular abscess. EXAM: CT GUIDED DRAINAGE OF LEFT PELVIC ABSCESS ANESTHESIA/SEDATION: Intravenous Fentanyl and Versed were administered as conscious sedation during continuous monitoring of the patient's level of consciousness and physiological / cardiorespiratory status by the radiology RN, with a total moderate sedation time of 12 minutes. PROCEDURE: The procedure, risks, benefits, and alternatives were explained to the patient. Questions regarding the procedure were encouraged and answered. The patient understands and consents to the procedure. Select axial scans through the pelvis obtained. The left collection was localized, corresponding to that seen on prior CT, and an appropriate skin entry site determined and marked. The operative field was prepped with chlorhexidinein a sterile fashion, and a sterile drape was applied covering the operative field. A sterile gown and sterile gloves were used for the procedure. Local anesthesia was provided with 1% Lidocaine. Under CT fluoroscopic guidance, 18 gauge trocar needle advanced to the collection. Purulent material could be aspirated. Amplatz guidewire advanced easily within the collection, position confirmed on CT. Tract dilated to facilitate placement of a 10 French pigtail catheter, formed centrally within the collection. 5 mL of greenish purulent aspirate were sent for Gram stain and culture. Catheter secured externally with 0 Prolene suture, flushed, and placed to gravity drain bag. The patient tolerated the procedure  well. COMPLICATIONS: None immediate FINDINGS: Loculated gas and fluid collection in the left pelvis again localized. Percutaneous drain catheter placed as above. Sample of the aspirate sent for Gram stain and culture. IMPRESSION: 1. Technically successful CT-guided left pelvic abscess drain catheter placement. Electronically Signed   By: Lucrezia Europe M.D.   On: 07/12/2017 10:31    Labs:  CBC: Recent Labs    07/06/17 1855 07/07/17 0244 07/11/17 0359 07/13/17 0430  WBC 13.3* 13.3* 7.7 7.2  HGB 11.3* 10.7* 9.9* 10.9*  HCT 34.2* 32.5* 31.1* 34.2*  PLT 245 237 254 319    COAGS: Recent Labs    07/07/17 0236  07/10/17 0336 07/11/17 0359 07/12/17 0610 07/13/17 0430  INR 2.10   < > 2.01 1.76 1.37 1.24  APTT 56*  --   --   --   --   --    < > = values in this interval not displayed.    BMP: Recent Labs    07/06/17 1855 07/07/17 0244 07/10/17 0336 07/11/17 0359  NA 130* 131* 138 137  K 3.9 3.3* 3.4* 3.1*  CL 91* 93* 97* 96*  CO2 27 25 31 30   GLUCOSE 112* 111* 94 94  BUN 16 16 11 11   CALCIUM 9.2 9.2 9.0 8.9  CREATININE 1.06* 1.01* 1.05* 1.00  GFRNONAA 48* 51* 49* 52*  GFRAA 56* 59* 57* >60    LIVER FUNCTION TESTS: Recent Labs    02/01/17 0941 02/26/17 0252 03/24/17 1229 07/06/17 1510 07/06/17 1855  BILITOT 0.4 0.6 0.4 1.2 1.4*  AST 20 32 21 19 22   ALT 18 23 16 11 15   ALKPHOS 82 76 80  --  79  PROT 7.5 7.2 8.0 7.0 7.9  ALBUMIN 4.3 3.8 3.7  --  3.6    Assessment and Plan: Intra-abdominal fluid collection s/p drain placement 07/12/17 Drain intact with blood-tinged output. 25 mL recorded.  Flushes easily per RN.  Insertion site intact.  Remains afebrile.  Cultures pending.  Remains on IV abx. IR to follow.   Electronically Signed: Docia Barrier, PA 07/13/2017, 1:21 PM   I spent a total of 15 Minutes at the the patient's bedside AND on the patient's hospital floor or unit, greater than 50% of which was counseling/coordinating care for  intra-abdominal fluid collection.

## 2017-07-13 NOTE — Progress Notes (Signed)
[facility] Progress Note     Subjective: CC:  Reports abdominal pain that she had when she came in is resolved, now having pain around drain site. Having bowel movements, reports a loose BM around 5:30 AM. Tolerating full liquids without exacerbation of pain. Denies fever, nausea, vomiting.   Objective: Vital signs in last 24 hours: Temp:  [98 F (36.7 C)-98.4 F (36.9 C)] 98 F (36.7 C) (11/07 0457) Pulse Rate:  [49-80] 70 (11/07 0931) Resp:  [18] 18 (11/07 0457) BP: (150-182)/(51-78) 150/57 (11/07 0931) SpO2:  [97 %-98 %] 97 % (11/07 0931) Weight:  [94 kg (207 lb 3.2 oz)] 94 kg (207 lb 3.2 oz) (11/07 0457) Last BM Date: 07/12/17  Intake/Output from previous day: 11/06 0701 - 11/07 0700 In: 738 [P.O.:720; I.V.:8] Out: 1325 [Urine:1300; Drains:25] Intake/Output this shift: Total I/O In: 483 [P.O.:480; I.V.:3] Out: -   PE: Gen:  Alert, NAD, pleasant, sitting up in chair  HEENT: EOM's intact, pupils equal and round Card:  Irregularly irregular Pulm:  CTAB, no W/R/R, effort normal Abd: Soft, ND, +BS, no HSM, no hernia, drain LLQ with minimal pink/sanguinous drainage in bad, appropriately TTP around drain Psych: A&Ox3  Skin: no rashes noted, warm and dry  Lab Results:  Recent Labs    07/11/17 0359 07/13/17 0430  WBC 7.7 7.2  HGB 9.9* 10.9*  HCT 31.1* 34.2*  PLT 254 319   BMET Recent Labs    07/11/17 0359  NA 137  K 3.1*  CL 96*  CO2 30  GLUCOSE 94  BUN 11  CREATININE 1.00  CALCIUM 8.9   PT/INR Recent Labs    07/12/17 0610 07/13/17 0430  LABPROT 16.8* 15.5*  INR 1.37 1.24   CMP     Component Value Date/Time   NA 137 07/11/2017 0359   NA 139 05/30/2017 1039   NA 140 08/08/2014 0951   K 3.1 (L) 07/11/2017 0359   K 4.2 08/08/2014 0951   CL 96 (L) 07/11/2017 0359   CL 102 02/09/2013 0947   CO2 30 07/11/2017 0359   CO2 26 08/08/2014 0951   GLUCOSE 94 07/11/2017 0359   GLUCOSE 119 08/08/2014 0951   GLUCOSE 153 (H) 02/09/2013 0947    BUN 11 07/11/2017 0359   BUN 19 05/30/2017 1039   BUN 14.4 08/08/2014 0951   CREATININE 1.00 07/11/2017 0359   CREATININE 1.04 (H) 07/06/2017 1510   CREATININE 0.8 08/08/2014 0951   CALCIUM 8.9 07/11/2017 0359   CALCIUM 9.8 08/08/2014 0951   PROT 7.9 07/06/2017 1855   PROT 7.5 02/01/2017 0941   PROT 7.8 08/08/2014 0951   ALBUMIN 3.6 07/06/2017 1855   ALBUMIN 4.3 02/01/2017 0941   ALBUMIN 3.8 08/08/2014 0951   AST 22 07/06/2017 1855   AST 21 08/08/2014 0951   ALT 15 07/06/2017 1855   ALT 18 08/08/2014 0951   ALKPHOS 79 07/06/2017 1855   ALKPHOS 92 08/08/2014 0951   BILITOT 1.4 (H) 07/06/2017 1855   BILITOT 0.4 02/01/2017 0941   BILITOT 0.49 08/08/2014 0951   GFRNONAA 52 (L) 07/11/2017 0359   GFRNONAA 51 (L) 07/06/2017 1510   GFRAA >60 07/11/2017 0359   GFRAA 59 (L) 07/06/2017 1510   Lipase     Component Value Date/Time   LIPASE 70 (H) 02/26/2017 0252       Studies/Results: Ct Abdomen Pelvis Wo Contrast  Result Date: 07/11/2017 CLINICAL DATA:  History of left pelvic abscess, previously not amenable to percutaneous drainage. Evaluate for interval enlargement. EXAM:  CT ABDOMEN AND PELVIS WITHOUT CONTRAST TECHNIQUE: Multidetector CT imaging of the abdomen and pelvis was performed following the standard protocol without IV contrast. COMPARISON:  CT abdomen pelvis - 07/06/2017 ; 04/27/2017; 03/24/2017 FINDINGS: The lack of intravenous contrast limits the ability to evaluate solid abdominal organs. Lower chest: Limited visualization of the lower thorax demonstrates minimal dependent subsegmental atelectasis within the medial basilar segment of the right lower lobe. No focal airspace opacities. No pleural effusion. Cardiomegaly. No pericardial effusion. Calcifications within the mitral valve annulus. Hepatobiliary: There is mild nodularity hepatic contour suggestive of cirrhotic change. There is a minimal amount of focal fatty infiltration adjacent to the fissure filling and thin  teres. Post cholecystectomy. No ascites. Pancreas: Normal noncontrast appearance of the pancreas Spleen: Multiple punctate granuloma are seen within an otherwise normal-appearing spleen. Adrenals/Urinary Tract: Normal noncontrast appearance the bilateral kidneys. No renal stones. No renal stones are seen along expected course of either ureter or the urinary bladder. No evidence of urinary obstruction. Normal noncontrast appearance the bilateral adrenal glands. Normal appearance of the urinary bladder given degree distention. Stomach/Bowel: Re- demonstrated air and fluid containing structure centered within the left adnexa with dominant component measuring approximately 5.2 x 3.3 cm, previously, 5.4 x 3.2 cm. This structure is again associated with adjacent colonic diverticulosis and minimal amount of pericolonic stranding. Re- demonstrated fluid seen within the endometrial canal with dominant component measuring approximately 3.3 x 2.5 cm. No new or enlarging abdominal or pelvic fluid collections. Stable sequela of prior right hemicolectomy. No evidence of enteric obstruction. No pneumoperitoneum, pneumatosis or portal venous gas. Vascular/Lymphatic: Atherosclerotic plaque with a normal caliber abdominal aorta. Scattered retroperitoneal and mesenteric lymph nodes are numerous though individually not enlarged by size criteria with index aortocaval lymph node measuring 0.9 cm in greatest short axis diameter (image 35, series 3), presumably reactive. No enlarging bulky retroperitoneal, mesenteric, pelvic or inguinal lymphadenopathy. Reproductive: As detailed in the stomach/bowel section above. Other: There is a minimal subcutaneous edema about the midline of the low back. Musculoskeletal: Moderate severe multilevel lumbar spine DDD, worse at L1-L2 and L5-S1 with disc space height loss, endplate irregularity and sclerosis. Stigmata of DISH with the caudal aspect of the thoracic spine. Degenerative change of the pubic  symphysis. IMPRESSION: 1. Grossly unchanged size and appearance of approximately 5.2 cm left adnexal air and fluid collection, incompletely characterized on this noncontrast examination, though again favored to represent secondary involvement of the left adnexa due to adjacent diverticular disease. Given persistence, this structure [place] be amenable to CT-guided percutaneous aspiration and/or drainage catheter placement as clinically indicated. 2. Fluid is again seen within the endometrial canal, an abnormal finding in this postmenopausal patient and given suspected infectious process within the left adnexa, concomitant infection of the endometrium is not excluded on the basis this examination. Correlation for vaginal discharge is recommended. Further evaluation with pelvic examination could be performed as indicated. 3. Colonic diverticulosis without evidence of worsening diverticulitis. No new or enlarging fluid collections within the abdomen or pelvis. 4. Post right hemicolectomy without evidence of enteric obstruction. 5. Nodularity hepatic contour suggestive of early cirrhotic change. Correlation LFTs is recommended. 6. Sequela of prior granulomatous disease as above. 7.  Aortic Atherosclerosis (ICD10-I70.0). Electronically Signed   By: Sandi Mariscal M.D.   On: 07/11/2017 17:02   Ct Image Guided Drainage By Percutaneous Catheter  Result Date: 07/12/2017 CLINICAL DATA:  Persistent left pelvic diverticular abscess. EXAM: CT GUIDED DRAINAGE OF LEFT PELVIC ABSCESS ANESTHESIA/SEDATION: Intravenous Fentanyl and Versed were administered  as conscious sedation during continuous monitoring of the patient's level of consciousness and physiological / cardiorespiratory status by the radiology RN, with a total moderate sedation time of 12 minutes. PROCEDURE: The procedure, risks, benefits, and alternatives were explained to the patient. Questions regarding the procedure were encouraged and answered. The patient understands  and consents to the procedure. Select axial scans through the pelvis obtained. The left collection was localized, corresponding to that seen on prior CT, and an appropriate skin entry site determined and marked. The operative field was prepped with chlorhexidinein a sterile fashion, and a sterile drape was applied covering the operative field. A sterile gown and sterile gloves were used for the procedure. Local anesthesia was provided with 1% Lidocaine. Under CT fluoroscopic guidance, 18 gauge trocar needle advanced to the collection. Purulent material could be aspirated. Amplatz guidewire advanced easily within the collection, position confirmed on CT. Tract dilated to facilitate placement of a 10 French pigtail catheter, formed centrally within the collection. 5 mL of greenish purulent aspirate were sent for Gram stain and culture. Catheter secured externally with 0 Prolene suture, flushed, and placed to gravity drain bag. The patient tolerated the procedure well. COMPLICATIONS: None immediate FINDINGS: Loculated gas and fluid collection in the left pelvis again localized. Percutaneous drain catheter placed as above. Sample of the aspirate sent for Gram stain and culture. IMPRESSION: 1. Technically successful CT-guided left pelvic abscess drain catheter placement. Electronically Signed   By: Lucrezia Europe M.D.   On: 07/12/2017 10:31    Anti-infectives: Anti-infectives (From admission, onward)   Start     Dose/Rate Route Frequency Ordered Stop   07/07/17 1000  piperacillin-tazobactam (ZOSYN) IVPB 3.375 g     3.375 g 12.5 mL/hr over 240 Minutes Intravenous Every 8 hours 07/07/17 0902     07/07/17 0800  piperacillin-tazobactam (ZOSYN) IVPB 3.375 g  Status:  Discontinued     3.375 g 12.5 mL/hr over 240 Minutes Intravenous Every 8 hours 07/07/17 0217 07/07/17 0902   07/07/17 0130  piperacillin-tazobactam (ZOSYN) IVPB 3.375 g     3.375 g 100 mL/hr over 30 Minutes Intravenous  Once 07/07/17 0125 07/07/17 0335      Assessment/Plan History of colon cancer with right hemicolectomy and chemotherapy,2003 History of left breast cancer with lumpectomy and chemotherapy. CAD Atrial Fibrillation on coumadin -INR 1.24 this AM, holding coumadin Chronic dCHF - torsemide, spironolactone, metoprolol COPD/history of pulmonary HTN HLD HTN H/o GERD/hiatal hernia.  Pelvic abscess/most likely diverticulitis - s/p pelvic drain placement 11/6 Dr. Vernard Gambles - afebrile, normal WBC - culture pending   FEN: IVF, FLD, protein shakes; ok to advance diet from surgical perspective. ID: Zosyn 11/1 >>day#7; leukocytosis resolved  DVT: SCD's, ok to resume anticoagulation from surgical perspective. Hgb/hct stable; BP stable. Foley: None Follow up:  TBD  Plan: Advance diet to SOFT. Continue drain and IV antibiotics. Follow culture.     LOS: 6 days    Jill Alexanders , [facility] 07/13/2017, 10:33 AM Pager: [phone] Consults: [phone] Mon-Fri 7:00 am-4:30 pm Sat-Sun 7:00 am-11:30 am

## 2017-07-13 NOTE — Progress Notes (Signed)
ANTICOAGULATION CONSULT NOTE - Initial Consult  Pharmacy Consult for warfarin Indication: atrial fibrillation  Allergies  Allergen Reactions  . Contrast Media [Iodinated Diagnostic Agents] Other (See Comments) and Hives    Per pt strong burning sensation starting in chest radiating outward Per pt strong burning sensation starting in chest radiating outward  . Clonidine Derivatives Other (See Comments)    Throat dry  . Statins Other (See Comments)    "bones hurt"  . Sulfa Antibiotics Diarrhea    Tremors   . Celebrex [Celecoxib] Rash  . Isosorbide Nitrate Itching and Rash  . Other Itching and Rash    Plastic and paper tape and heart monitor pads  . Tape Itching and Rash    Red Where applied and will spread    Patient Measurements: Height: 5\' 7"  (170.2 cm) Weight: 207 lb 3.2 oz (94 kg)(scale b) IBW/kg (Calculated) : 61.6  Vital Signs: Temp: 98 F (36.7 C) (11/07 0457) Temp Source: Oral (11/07 0457) BP: 139/63 (11/07 1155) Pulse Rate: 64 (11/07 1155)  Labs: Recent Labs    07/11/17 0359 07/12/17 0610 07/13/17 0430  HGB 9.9*  --  10.9*  HCT 31.1*  --  34.2*  PLT 254  --  319  LABPROT 20.4* 16.8* 15.5*  INR 1.76 1.37 1.24  CREATININE 1.00  --   --     Estimated Creatinine Clearance: 52.8 mL/min (by C-G formula based on SCr of 1 mg/dL).   Medical History: Past Medical History:  Diagnosis Date  . Allergy    rhinitis  . Aortic stenosis    mild by echo 06/2017  . Arthritis   . Breast cancer ([place]) 01/06/2012  . Cancer [facility]    right colon and left breast  . Chronic diastolic CHF (congestive heart failure) ([place])   . Colon cancer ([place]) 01/06/2012  . COPD (chronic obstructive pulmonary disease) ([place])   . Coronary artery disease 2006   nonobstructive with 20-30% ostial LAD and LM  . Dilated aortic root ([place])    56mmHg by echo 06/2017  . Diverticulosis   . Edema extremities   . GERD (gastroesophageal reflux disease)   . Hernia   . Hiatal hernia    denies  .  Hyperlipidemia   . Hypertension   . Mild aortic stenosis    echo 11/2015 but not noted on echo 06/2016  . Osteopenia   . Permanent atrial fibrillation ([facility])    chronic atrial fibrillation  . Pneumonia    hx child  . Pulmonary HTN ([place])    moderate to severe PASP 81mmHg echo 11/2015 - now 65mmHg by echo 06/2017  . Takotsubo syndrome 07/29/2015   a. EF 35-40% by echo; akinesis of mid-apical anteroseptal and apical myocardium.  EF now normalized on echo 11/2015    Medications:  Medications Prior to Admission  Medication Sig Dispense Refill Last Dose  . acetaminophen (TYLENOL) 325 MG tablet Take 650 mg by mouth every 6 (six) hours as needed for mild pain. Reported on 03/15/2016   Past Month at Unknown time  . amLODipine (NORVASC) 5 MG tablet Take 1 tablet (5 mg total) by mouth daily. 30 tablet 3 07/06/2017 at Unknown time  . cetirizine (ZYRTEC) 10 MG tablet Take 10 mg by mouth daily as needed for allergies.   Past Month at Unknown time  . clonazePAM (KLONOPIN) 0.5 MG tablet Take 1 tablet (0.5 mg total) by mouth 2 (two) times daily as needed for anxiety. 60 tablet 1 07/06/2017 at Unknown time  .  cloNIDine (CATAPRES) 0.1 MG tablet TAKE ONE TABLET BY MOUTH TWICE DAILY 60 tablet 11 07/06/2017 at Unknown time  . guaiFENesin (MUCINEX) 600 MG 12 hr tablet Take 600 mg by mouth daily as needed for cough or to loosen phlegm.   Past Month at Unknown time  . HYDROcodone-acetaminophen (NORCO) 7.5-325 MG tablet Take 1 tablet by mouth every 6 (six) hours as needed for moderate pain. 30 tablet 0 Past Week at Unknown time  . losartan (COZAAR) 100 MG tablet Take 1 tablet (100 mg total) by mouth daily. 30 tablet 3 07/06/2017 at Unknown time  . metoprolol succinate (TOPROL-XL) 50 MG 24 hr tablet TAKE ONE TABLET BY MOUTH ONCE DAILY TAKE  WITH  OR  FOLLOWING  A  MEAL 90 tablet 3 07/06/2017 at 0800  . mirtazapine (REMERON) 30 MG tablet Take 1 tablet (30 mg total) by mouth at bedtime. 30 tablet 1 07/05/2017 at Unknown  time  . montelukast (SINGULAIR) 10 MG tablet Take 1 tablet (10 mg total) by mouth at bedtime. (Patient taking differently: Take 10 mg by mouth at bedtime as needed (for allergies). ) 30 tablet 3 Past Month at Unknown time  . nitroGLYCERIN (NITROSTAT) 0.4 MG SL tablet Place 1 tablet (0.4 mg total) under the tongue every 5 (five) minutes as needed for chest pain (x 3 pills). 30 tablet 2 unkonwn  . omeprazole (PRILOSEC) 20 MG capsule Take 1 capsule (20 mg total) by mouth daily as needed (heartburn, acid reflux). 90 capsule 3 Past Month at Unknown time  . OVER THE COUNTER MEDICATION Place 1 drop into both eyes daily as needed (dry eyes). Eye drops   Past Month at Unknown time  . spironolactone (ALDACTONE) 25 MG tablet TAKE ONE-HALF TABLET BY MOUTH ONCE DAILY 45 tablet 3 07/06/2017 at Unknown time  . torsemide (DEMADEX) 20 MG tablet Take 60 mg by mouth daily.    07/06/2017 at Unknown time  . triamcinolone cream (KENALOG) 0.1 % Apply 1 application topically 2 (two) times daily. 30 g 0 Past Month at Unknown time  . warfarin (COUMADIN) 1 MG tablet Take 0.5 tablets (0.5 mg total) by mouth as directed. M-W-F-SA (Patient taking differently: Take 3 mg by mouth See admin instructions. Mon/Wed/Fri/Sat) 90 tablet 2 07/04/2017 at 2300  . warfarin (COUMADIN) 5 MG tablet Take 0.5 tablets (2.5 mg total) by mouth See admin instructions. Take half a tablet (2.5 mg) on Tuesdays, Thursdays, and Sundays. Take half a tablet with half a tablet of 1 mg (total 3 mg) on all other days. (Patient taking differently: Take 2.5 mg by mouth See admin instructions. Take 1/2 tablet  on Tuesdays, Thursdays, and Sundays.) 90 tablet 2 07/05/2017 at 2300    Assessment: 82 y/o female admitted 07/07/2017 with pelvic abscess s/p drain placement by IR on 11/6. She is on warfarin PTA for Afib. Pharmacy consulted to manage inpatient.  INR is subtherapeutic at 1.24. No bleeding noted, CBC is stable.  PTA regimen: 3 mg daily exc 2.5 mg TTSu (has 5  and 1 mg tabs)  Goal of Therapy:  INR 2-3 Monitor platelets by anticoagulation protocol: Yes   Plan:  Warfarin 5 mg PO tonight Daily INR Monitor for s/sx of bleeding   Renold Genta, PharmD, BCPS Clinical Pharmacist Phone for today - [place] - [phone] 07/13/2017 12:18 PM

## 2017-07-13 NOTE — Progress Notes (Signed)
Changed patients dressing. Patient tolerated well. Used gauze and tape.

## 2017-07-13 NOTE — Progress Notes (Signed)
PROGRESS NOTE    [name]  ZOX:[id number] DOB: [birth year] DOA: 07/07/2017 PCP: Orlena Sheldon, PA-C  Brief Narrative: 82 y.o. female with medical history significant of hypertension, hyperlipidemia, COPD, GERD, depression, slightly, pulmonary hypertension, Takotsubo syndrome, atrial fibrillation on Coumadin, CAD, aortic root dilation, dCHF, colon cancer (s/p pf partial resection and radiation therapy), breast cancer (s/p of left lumpectomy), who presents with abdominal pain.  Patient states that she has been having intermittent abdominal pain for about a six-month, which has worsened in the past 3 days. Her abdominal pain is located in mainly in the lower abdomen, but also involving whole abdomen, which is constant, 8 out of 10 in severity, sharp, nonradiating. It is not associated with nausea, vomiting. She has chronic diarrhea, but not today. Denies fever or chills. Patient does not have chest pain, shortness breath, cough. She states that she has a little burning on urination, but denies dysuria or urgency. Pt was seen by PCP and had CT scan which showed diverticulitis with complex adnexal abscess. She has had a colonoscopy by Dr. Cristina Gong approximately 5 years ago.  ED Course: pt was found to have WBC 13.3, lactic acid 1.36, negative urinalysis, electrolytes and renal function okay, temperature 99.1, no tachycardia, no tachypnea, oxygen saturation 95%onRA.  Patient had drain placed by radiology under CT guidance to the left lower quadrant.  She is complaining of pain at the site of the drain.  She had bowel movements loose. Assessment & Plan:   Principal Problem:   Pelvic abscess in female Active Problems:   GERD (gastroesophageal reflux disease)   Hypertension   COPD (chronic obstructive pulmonary disease) (HCC)   Coronary artery disease   Permanent atrial fibrillation (HCC)   Chronic anticoagulation   Chronic diastolic CHF (congestive heart failure) (HCC)  Pelvic  abscess/diverticulitis status post drain placement 07/12/2017 by IR.  Surgery following.  On IV antibiotics Zosyn.  Dr. Glenna Durand note reviewed.  Chronic atrial fibrillation restart Coumadin.  Continue metoprolol.  Chronic diastolic CHF stable on torsemide and spironolactone continue metoprolol.       DVT prophylaxis:coumadin Code Status: full Family Communicationno family available Disposition Plan Home when stable  Consultants:   Surgery and IR  Procedures:drain placement nov 6 [id number] Antimicrobials:  zosyn Subjective: Complaints of pain at the drain site otherwise feeling better  Objective: Vitals:   07/12/17 1230 07/12/17 1930 07/13/17 0457 07/13/17 0931  BP: (!) 160/60 (!) 157/56 (!) 169/78 (!) 150/57  Pulse: (!) 53 (!) 49 80 70  Resp:  18 18   Temp:  98.4 F (36.9 C) 98 F (36.7 C)   TempSrc:  Oral Oral   SpO2:  98% 98% 97%  Weight:   94 kg (207 lb 3.2 oz)   Height:        Intake/Output Summary (Last 24 hours) at 07/13/2017 1117 Last data filed at 07/13/2017 0953 Gross per 24 hour  Intake 1221 ml  Output 1320 ml  Net -99 ml   Filed Weights   07/11/17 0504 07/12/17 0521 07/13/17 0457  Weight: 96.6 kg (212 lb 14.4 oz) 94.1 kg (207 lb 8 oz) 94 kg (207 lb 3.2 oz)    Examination:  General exam: Appears calm and comfortable  Respiratory system: Clear to auscultation. Respiratory effort normal. Cardiovascular system: S1 & S2 heard, RRR. No JVD, murmurs, rubs, gallops or clicks. No pedal edema. Gastrointestinal system: Abdomen is nondistended, soft and tender. No organomegaly or masses felt. Normal bowel sounds heard.drain LLQ  Central nervous system:  Alert and oriented. No focal neurological deficits. Extremities: Symmetric 5 x 5 power. Skin: No rashes, lesions or ulcers Psychiatry: Judgement and insight appear normal. Mood & affect appropriate.     Data Reviewed: I have personally reviewed following labs and imaging studies  CBC: Recent Labs  Lab  07/06/17 1510 07/06/17 1855 07/07/17 0244 07/11/17 0359 07/13/17 0430  WBC 13.1* 13.3* 13.3* 7.7 7.2  NEUTROABS  --  9.5*  --   --   --   HGB 11.4* 11.3* 10.7* 9.9* 10.9*  HCT 34.5* 34.2* 32.5* 31.1* 34.2*  MCV 92.5 90.5 89.8 92.0 91.4  PLT 226 245 237 254 563   Basic Metabolic Panel: Recent Labs  Lab 07/06/17 1510 07/06/17 1855 07/07/17 0244 07/10/17 0336 07/11/17 0359  NA 131* 130* 131* 138 137  K 3.8 3.9 3.3* 3.4* 3.1*  CL 92* 91* 93* 97* 96*  CO2 30 27 25 31 30   GLUCOSE 113* 112* 111* 94 94  BUN 18 16 16 11 11   CREATININE 1.04* 1.06* 1.01* 1.05* 1.00  CALCIUM 8.8 9.2 9.2 9.0 8.9   GFR: Estimated Creatinine Clearance: 52.8 mL/min (by C-G formula based on SCr of 1 mg/dL). Liver Function Tests: Recent Labs  Lab 07/06/17 1510 07/06/17 1855  AST 19 22  ALT 11 15  ALKPHOS  --  79  BILITOT 1.2 1.4*  PROT 7.0 7.9  ALBUMIN  --  3.6   No results for input(s): LIPASE, AMYLASE in the last 168 hours. No results for input(s): AMMONIA in the last 168 hours. Coagulation Profile: Recent Labs  Lab 07/09/17 0706 07/10/17 0336 07/11/17 0359 07/12/17 0610 07/13/17 0430  INR 2.10 2.01 1.76 1.37 1.24   Cardiac Enzymes: No results for input(s): CKTOTAL, CKMB, CKMBINDEX, TROPONINI in the last 168 hours. BNP (last 3 results) Recent Labs    11/29/16 1107  PROBNP 834*   HbA1C: No results for input(s): HGBA1C in the last 72 hours. CBG: Recent Labs  Lab 07/09/17 0625 07/10/17 0502 07/11/17 0754 07/12/17 0609 07/13/17 0553  GLUCAP 101* 100* 103* 89 95   Lipid Profile: No results for input(s): CHOL, HDL, LDLCALC, TRIG, CHOLHDL, LDLDIRECT in the last 72 hours. Thyroid Function Tests: No results for input(s): TSH, T4TOTAL, FREET4, T3FREE, THYROIDAB in the last 72 hours. Anemia Panel: No results for input(s): VITAMINB12, FOLATE, FERRITIN, TIBC, IRON, RETICCTPCT in the last 72 hours. Sepsis Labs: Recent Labs  Lab 07/06/17 1924 07/07/17 0203 07/07/17 0236    PROCALCITON  --   --  <0.10  LATICACIDVEN 1.36 1.1  --     Recent Results (from the past 240 hour(s))  Culture, blood (Routine X 2) w Reflex to ID Panel     Status: None   Collection Time: 07/07/17  2:45 AM  Result Value Ref Range Status   Specimen Description BLOOD LEFT FOREARM  Final   Special Requests   Final    BOTTLES DRAWN AEROBIC AND ANAEROBIC Blood Culture adequate volume   Culture NO GROWTH 5 DAYS  Final   Report Status 07/12/2017 FINAL  Final  Culture, blood (Routine X 2) w Reflex to ID Panel     Status: None   Collection Time: 07/07/17  2:54 AM  Result Value Ref Range Status   Specimen Description BLOOD RIGHT HAND  Final   Special Requests   Final    BOTTLES DRAWN AEROBIC AND ANAEROBIC Blood Culture adequate volume   Culture NO GROWTH 5 DAYS  Final   Report Status 07/12/2017 FINAL  Final  Aerobic/Anaerobic Culture (surgical/deep wound)     Status: None (Preliminary result)   Collection Time: 07/12/17 10:20 AM  Result Value Ref Range Status   Specimen Description ABSCESS  Final   Special Requests CT LLQ DRAIN  Final   Gram Stain   Final    ABUNDANT WBC PRESENT, PREDOMINANTLY PMN NO ORGANISMS SEEN    Culture PENDING  Incomplete   Report Status PENDING  Incomplete         Radiology Studies: Ct Abdomen Pelvis Wo Contrast  Result Date: 07/11/2017 CLINICAL DATA:  History of left pelvic abscess, previously not amenable to percutaneous drainage. Evaluate for interval enlargement. EXAM: CT ABDOMEN AND PELVIS WITHOUT CONTRAST TECHNIQUE: Multidetector CT imaging of the abdomen and pelvis was performed following the standard protocol without IV contrast. COMPARISON:  CT abdomen pelvis - 07/06/2017 ; 04/27/2017; 03/24/2017 FINDINGS: The lack of intravenous contrast limits the ability to evaluate solid abdominal organs. Lower chest: Limited visualization of the lower thorax demonstrates minimal dependent subsegmental atelectasis within the medial basilar segment of the right  lower lobe. No focal airspace opacities. No pleural effusion. Cardiomegaly. No pericardial effusion. Calcifications within the mitral valve annulus. Hepatobiliary: There is mild nodularity hepatic contour suggestive of cirrhotic change. There is a minimal amount of focal fatty infiltration adjacent to the fissure filling and thin teres. Post cholecystectomy. No ascites. Pancreas: Normal noncontrast appearance of the pancreas Spleen: Multiple punctate granuloma are seen within an otherwise normal-appearing spleen. Adrenals/Urinary Tract: Normal noncontrast appearance the bilateral kidneys. No renal stones. No renal stones are seen along expected course of either ureter or the urinary bladder. No evidence of urinary obstruction. Normal noncontrast appearance the bilateral adrenal glands. Normal appearance of the urinary bladder given degree distention. Stomach/Bowel: Re- demonstrated air and fluid containing structure centered within the left adnexa with dominant component measuring approximately 5.2 x 3.3 cm, previously, 5.4 x 3.2 cm. This structure is again associated with adjacent colonic diverticulosis and minimal amount of pericolonic stranding. Re- demonstrated fluid seen within the endometrial canal with dominant component measuring approximately 3.3 x 2.5 cm. No new or enlarging abdominal or pelvic fluid collections. Stable sequela of prior right hemicolectomy. No evidence of enteric obstruction. No pneumoperitoneum, pneumatosis or portal venous gas. Vascular/Lymphatic: Atherosclerotic plaque with a normal caliber abdominal aorta. Scattered retroperitoneal and mesenteric lymph nodes are numerous though individually not enlarged by size criteria with index aortocaval lymph node measuring 0.9 cm in greatest short axis diameter (image 35, series 3), presumably reactive. No enlarging bulky retroperitoneal, mesenteric, pelvic or inguinal lymphadenopathy. Reproductive: As detailed in the stomach/bowel section above.  Other: There is a minimal subcutaneous edema about the midline of the low back. Musculoskeletal: Moderate severe multilevel lumbar spine DDD, worse at L1-L2 and L5-S1 with disc space height loss, endplate irregularity and sclerosis. Stigmata of DISH with the caudal aspect of the thoracic spine. Degenerative change of the pubic symphysis. IMPRESSION: 1. Grossly unchanged size and appearance of approximately 5.2 cm left adnexal air and fluid collection, incompletely characterized on this noncontrast examination, though again favored to represent secondary involvement of the left adnexa due to adjacent diverticular disease. Given persistence, this structure Lanpher be amenable to CT-guided percutaneous aspiration and/or drainage catheter placement as clinically indicated. 2. Fluid is again seen within the endometrial canal, an abnormal finding in this postmenopausal patient and given suspected infectious process within the left adnexa, concomitant infection of the endometrium is not excluded on the basis this examination. Correlation for vaginal discharge is recommended. Further evaluation with  pelvic examination could be performed as indicated. 3. Colonic diverticulosis without evidence of worsening diverticulitis. No new or enlarging fluid collections within the abdomen or pelvis. 4. Post right hemicolectomy without evidence of enteric obstruction. 5. Nodularity hepatic contour suggestive of early cirrhotic change. Correlation LFTs is recommended. 6. Sequela of prior granulomatous disease as above. 7.  Aortic Atherosclerosis (ICD10-I70.0). Electronically Signed   By: Sandi Mariscal M.D.   On: 07/11/2017 17:02   Ct Image Guided Drainage By Percutaneous Catheter  Result Date: 07/12/2017 CLINICAL DATA:  Persistent left pelvic diverticular abscess. EXAM: CT GUIDED DRAINAGE OF LEFT PELVIC ABSCESS ANESTHESIA/SEDATION: Intravenous Fentanyl and Versed were administered as conscious sedation during continuous monitoring of the  patient's level of consciousness and physiological / cardiorespiratory status by the radiology RN, with a total moderate sedation time of 12 minutes. PROCEDURE: The procedure, risks, benefits, and alternatives were explained to the patient. Questions regarding the procedure were encouraged and answered. The patient understands and consents to the procedure. Select axial scans through the pelvis obtained. The left collection was localized, corresponding to that seen on prior CT, and an appropriate skin entry site determined and marked. The operative field was prepped with chlorhexidinein a sterile fashion, and a sterile drape was applied covering the operative field. A sterile gown and sterile gloves were used for the procedure. Local anesthesia was provided with 1% Lidocaine. Under CT fluoroscopic guidance, 18 gauge trocar needle advanced to the collection. Purulent material could be aspirated. Amplatz guidewire advanced easily within the collection, position confirmed on CT. Tract dilated to facilitate placement of a 10 French pigtail catheter, formed centrally within the collection. 5 mL of greenish purulent aspirate were sent for Gram stain and culture. Catheter secured externally with 0 Prolene suture, flushed, and placed to gravity drain bag. The patient tolerated the procedure well. COMPLICATIONS: None immediate FINDINGS: Loculated gas and fluid collection in the left pelvis again localized. Percutaneous drain catheter placed as above. Sample of the aspirate sent for Gram stain and culture. IMPRESSION: 1. Technically successful CT-guided left pelvic abscess drain catheter placement. Electronically Signed   By: Lucrezia Europe M.D.   On: 07/12/2017 10:31        Scheduled Meds: . cloNIDine  0.1 mg Oral BID  . feeding supplement  1 Container Oral TID BM  . loratadine  10 mg Oral Daily  . losartan  100 mg Oral Daily  . metoprolol succinate  50 mg Oral Daily  . mirtazapine  30 mg Oral QHS  . montelukast   10 mg Oral QHS  . pantoprazole  40 mg Oral Daily  . sodium chloride flush  5 mL Intravenous Q8H  . spironolactone  12.5 mg Oral Daily  . torsemide  60 mg Oral Daily  . triamcinolone cream  1 application Topical BID   Continuous Infusions: . piperacillin-tazobactam (ZOSYN)  IV 3.375 g (07/13/17 0936)     LOS: 6 days       Georgette Shell, MD [facility]  If 7PM-7AM, please contact night-coverage [email or web address] Password [email or web address] 07/13/2017, 11:17 AM

## 2017-07-14 LAB — CBC WITH DIFFERENTIAL/PLATELET
BASOS ABS: 0 10*3/uL (ref 0.0–0.1)
BASOS PCT: 0 %
EOS ABS: 0.3 10*3/uL (ref 0.0–0.7)
EOS PCT: 4 %
HCT: 33.6 % — ABNORMAL LOW (ref 36.0–46.0)
HEMOGLOBIN: 10.6 g/dL — AB (ref 12.0–15.0)
Lymphocytes Relative: 32 %
Lymphs Abs: 2.2 10*3/uL (ref 0.7–4.0)
MCH: 29.2 pg (ref 26.0–34.0)
MCHC: 31.5 g/dL (ref 30.0–36.0)
MCV: 92.6 fL (ref 78.0–100.0)
Monocytes Absolute: 0.9 10*3/uL (ref 0.1–1.0)
Monocytes Relative: 13 %
NEUTROS PCT: 51 %
Neutro Abs: 3.5 10*3/uL (ref 1.7–7.7)
PLATELETS: 339 10*3/uL (ref 150–400)
RBC: 3.63 MIL/uL — AB (ref 3.87–5.11)
RDW: 14.3 % (ref 11.5–15.5)
WBC: 6.8 10*3/uL (ref 4.0–10.5)

## 2017-07-14 LAB — BASIC METABOLIC PANEL
Anion gap: 9 (ref 5–15)
BUN: 10 mg/dL (ref 6–20)
CHLORIDE: 95 mmol/L — AB (ref 101–111)
CO2: 33 mmol/L — ABNORMAL HIGH (ref 22–32)
Calcium: 9.1 mg/dL (ref 8.9–10.3)
Creatinine, Ser: 1.09 mg/dL — ABNORMAL HIGH (ref 0.44–1.00)
GFR calc non Af Amer: 47 mL/min — ABNORMAL LOW (ref >60)
GFR, EST AFRICAN AMERICAN: 54 mL/min — AB (ref >60)
Glucose, Bld: 104 mg/dL — ABNORMAL HIGH (ref 65–99)
POTASSIUM: 3.1 mmol/L — AB (ref 3.5–5.1)
SODIUM: 137 mmol/L (ref 135–145)

## 2017-07-14 LAB — PROTIME-INR
INR: 1.27
Prothrombin Time: 15.8 seconds — ABNORMAL HIGH (ref 11.4–15.2)

## 2017-07-14 LAB — GLUCOSE, CAPILLARY: GLUCOSE-CAPILLARY: 95 mg/dL (ref 65–99)

## 2017-07-14 MED ORDER — WARFARIN SODIUM 3 MG PO TABS
3.0000 mg | ORAL_TABLET | Freq: Once | ORAL | Status: AC
  Administered 2017-07-14: 3 mg via ORAL
  Filled 2017-07-14: qty 1

## 2017-07-14 NOTE — Discharge Instructions (Signed)
Information on my medicine - Coumadin®   (Warfarin) ° ° °What test will check on my response to Coumadin? °While on Coumadin (warfarin) you will need to have an INR test regularly to ensure that your dose is keeping you in the desired range. The INR (international normalized ratio) number is calculated from the result of the laboratory test called prothrombin time (PT). ° °If an INR APPOINTMENT HAS NOT ALREADY BEEN MADE FOR YOU please schedule an appointment to have this lab work done by your health care provider within 7 days. °Your INR goal is usually a number between:  2 to 3 or your provider Mounger give you a more narrow range like 2-2.5.  Ask your health care provider during an office visit what your goal INR is. ° °What  do you need to  know  About  COUMADIN? °Take Coumadin (warfarin) exactly as prescribed by your healthcare provider about the same time each day.  DO NOT stop taking without talking to the doctor who prescribed the medication.  Stopping without other blood clot prevention medication to take the place of Coumadin [place] increase your risk of developing a new clot or stroke.  Get refills before you run out. ° °What do you do if you miss a dose? °If you miss a dose, take it as soon as you remember on the same day then continue your regularly scheduled regimen the next day.  Do not take two doses of Coumadin at the same time. ° °Important Safety Information °A possible side effect of Coumadin (Warfarin) is an increased risk of bleeding. You should call your healthcare provider right away if you experience any of the following: °? Bleeding from an injury or your nose that does not stop. °? Unusual colored urine (red or dark brown) or unusual colored stools (red or black). °? Unusual bruising for unknown reasons. °? A serious fall or if you hit your head (even if there is no bleeding). ° °Some foods or medicines interact with Coumadin® (warfarin) and might alter your response to warfarin. To help avoid  this: °? Eat a balanced diet, maintaining a consistent amount of Vitamin K. °? Notify your provider about major diet changes you plan to make. °? Avoid alcohol or limit your intake to 1 drink for women and 2 drinks for men per day. °(1 drink is 5 oz. wine, 12 oz. beer, or 1.5 oz. liquor.) ° °Make sure that ANY health care provider who prescribes medication for you knows that you are taking Coumadin (warfarin).  Also make sure the healthcare provider who is monitoring your Coumadin knows when you have started a new medication including herbals and non-prescription products. ° °Coumadin® (Warfarin)  Major Drug Interactions  °Increased Warfarin Effect Decreased Warfarin Effect  °Alcohol (large quantities) °Antibiotics (esp. Septra/Bactrim, Flagyl, Cipro) °Amiodarone (Cordarone) °Aspirin (ASA) °Cimetidine (Tagamet) °Megestrol (Megace) °NSAIDs (ibuprofen, naproxen, etc.) °Piroxicam (Feldene) °Propafenone (Rythmol SR) °Propranolol (Inderal) °Isoniazid (INH) °Posaconazole (Noxafil) Barbiturates (Phenobarbital) °Carbamazepine (Tegretol) °Chlordiazepoxide (Librium) °Cholestyramine (Questran) °Griseofulvin °Oral Contraceptives °Rifampin °Sucralfate (Carafate) °Vitamin K  ° °Coumadin® (Warfarin) Major Herbal Interactions  °Increased Warfarin Effect Decreased Warfarin Effect  °Garlic °Ginseng °Ginkgo biloba Coenzyme Q10 °Green tea °St. John’s wort   ° °Coumadin® (Warfarin) FOOD Interactions  °Eat a consistent number of servings per week of foods HIGH in Vitamin K °(1 serving = ½ cup)  °Collards (cooked, or boiled & drained) °Kale (cooked, or boiled & drained) °Mustard greens (cooked, or boiled & drained) °Parsley *serving size only = ¼ cup °  Spinach (cooked, or boiled & drained) °Swiss chard (cooked, or boiled & drained) °Turnip greens (cooked, or boiled & drained)  °Eat a consistent number of servings per week of foods MEDIUM-HIGH in Vitamin K °(1 serving = 1 cup)  °Asparagus (cooked, or boiled & drained) °Broccoli (cooked,  boiled & drained, or raw & chopped) °Brussel sprouts (cooked, or boiled & drained) *serving size only = ½ cup °Lettuce, raw (green leaf, endive, romaine) °Spinach, raw °Turnip greens, raw & chopped  ° °These websites have more information on Coumadin (warfarin):  www.coumadin.com; °www.ahrq.gov/consumer/coumadin.htm; ° ° ° °

## 2017-07-14 NOTE — Progress Notes (Signed)
[place] for warfarin Indication: atrial fibrillation  Allergies  Allergen Reactions  . Contrast Media [Iodinated Diagnostic Agents] Other (See Comments) and Hives    Per pt strong burning sensation starting in chest radiating outward Per pt strong burning sensation starting in chest radiating outward  . Clonidine Derivatives Other (See Comments)    Throat dry  . Statins Other (See Comments)    "bones hurt"  . Sulfa Antibiotics Diarrhea    Tremors   . Celebrex [Celecoxib] Rash  . Isosorbide Nitrate Itching and Rash  . Other Itching and Rash    Plastic and paper tape and heart monitor pads  . Tape Itching and Rash    Red Where applied and will spread    Patient Measurements: Height: 5\' 7"  (170.2 cm) Weight: 207 lb 6.4 oz (94.1 kg)(scale b) IBW/kg (Calculated) : 61.6  Vital Signs: Temp: 97.5 F (36.4 C) (11/08 0510) Temp Source: Oral (11/08 0510) BP: 164/70 (11/08 0809) Pulse Rate: 77 (11/08 0809)  Labs: Recent Labs    07/12/17 0610 07/13/17 0430 07/14/17 0446  HGB  --  10.9* 10.6*  HCT  --  34.2* 33.6*  PLT  --  319 339  LABPROT 16.8* 15.5* 15.8*  INR 1.37 1.24 1.27  CREATININE  --   --  1.09*    Estimated Creatinine Clearance: 48.5 mL/min (A) (by C-G formula based on SCr of 1.09 mg/dL (H)).   Medical History: Past Medical History:  Diagnosis Date  . Allergy    rhinitis  . Aortic stenosis    mild by echo 06/2017  . Arthritis   . Breast cancer ([place]) 01/06/2012  . Cancer [facility])    right colon and left breast  . Chronic diastolic CHF (congestive heart failure) ([place])   . Colon cancer ([place]) 01/06/2012  . COPD (chronic obstructive pulmonary disease) ([place])   . Coronary artery disease 2006   nonobstructive with 20-30% ostial LAD and LM  . Dilated aortic root ([place])    79mmHg by echo 06/2017  . Diverticulosis   . Edema extremities   . GERD (gastroesophageal reflux disease)   . Hernia   . Hiatal hernia    denies  .  Hyperlipidemia   . Hypertension   . Mild aortic stenosis    echo 11/2015 but not noted on echo 06/2016  . Osteopenia   . Permanent atrial fibrillation (HCC)    chronic atrial fibrillation  . Pneumonia    hx child  . Pulmonary HTN ([place])    moderate to severe PASP 59mmHg echo 11/2015 - now 68mmHg by echo 06/2017  . Takotsubo syndrome 07/29/2015   a. EF 35-40% by echo; akinesis of mid-apical anteroseptal and apical myocardium.  EF now normalized on echo 11/2015    Assessment: 82 y/o female admitted 07/07/2017 with pelvic abscess s/p drain placement by IR on 11/6. She is on warfarin PTA for Afib.   INR is subtherapeutic at 1.2, due to warfarin being held for several days. No bleeding noted, CBC is stable.  PTA regimen: 3 mg daily exc 2.5 mg TTSu (has 5 and 1 mg tabs)  Goal of Therapy:  INR 2-3 Monitor platelets by anticoagulation protocol: Yes   Plan:  Warfarin 3 mg PO x1 Daily INR Monitor for s/sx of bleeding   Hughes Better, PharmD, BCPS Clinical Pharmacist 07/14/2017 10:05 AM

## 2017-07-14 NOTE — Progress Notes (Signed)
PROGRESS NOTE    [name]  DGL:[id number] DOB: 1935-08-21 DOA: 07/07/2017 PCP: Orlena Sheldon, PA-C  Brief Narrative: 82 y.o.femalewith medical history significant ofhypertension, hyperlipidemia, COPD, GERD, depression, slightly, pulmonary hypertension,Takotsubosyndrome, atrial fibrillation on Coumadin, CAD, aortic root dilation,dCHF, colon cancer (s/p pfpartialresection and radiation therapy),breast cancer (s/p ofleft lumpectomy), who presents with abdominal pain.  Patient states that she has been having intermittent abdominal pain for about a six-month,which has worsened in the past 3 days. Her abdominal pain is located in mainly in the lower abdomen, but also involvingwhole abdomen, which is constant, 8 out of 10 in severity, sharp, nonradiating. It is not associated with nausea, vomiting. She has chronicdiarrhea,but not today. Denies fever or chills. Patient does not have chest pain, shortness breath, cough.She states that she has a little burning on urination, but denies dysuria or urgency.Pt was seen by PCP and hadCT scanwhichshowed diverticulitis with complex adnexal abscess. She has had a colonoscopy by Dr. Cristina Gong approximately 5 years ago.  ED Course:pt was found to haveWBC 13.3, lactic acid 1.36, negative urinalysis, electrolytes and renal function okay, temperature 99.1, no tachycardia, no tachypnea, oxygen saturation 95%onRA. Patient had drain placed by radiology under CT guidance to the left lower quadrant.  She is complaining of pain at the site of the drain.  She had bowel movements loose.    Assessment & Plan:   Principal Problem:   Pelvic abscess in female Active Problems:   GERD (gastroesophageal reflux disease)   Hypertension   COPD (chronic obstructive pulmonary disease) (HCC)   Coronary artery disease   Permanent atrial fibrillation (HCC)   Chronic anticoagulation   Chronic diastolic CHF (congestive heart failure) (HCC)   Pelvic  abscess/diverticulitis status post drain placement 07/12/2017 by IR.  Surgery following.  On IV antibiotics Zosyn.  Dr. Glenna Durand note reviewed.  Minimal blood-tinged drainage overnight.  Chronic atrial fibrillation restart Coumadin.  Continue metoprolol.  Chronic diastolic CHF stable on torsemide and spironolactone continue metoprolol.       DVT prophylaxis: Coumadin Code Status: Full code Family Communication: No family available Disposition Plan:. Home once the drain is out  Consultants: Surgery and interventional radiology  Procedures: Drain placement to the left lower quadrant  Antimicrobials: Zosyn  Subjective:   Objective: Vitals:   07/13/17 1155 07/13/17 1924 07/14/17 0510 07/14/17 0809  BP: 139/63 (!) 148/71 (!) 161/73 (!) 164/70  Pulse: 64 77 76 77  Resp:  18 18   Temp:  98.8 F (37.1 C) (!) 97.5 F (36.4 C)   TempSrc:  Oral Oral   SpO2: 93% 94% 98% 94%  Weight:   94.1 kg (207 lb 6.4 oz)   Height:        Intake/Output Summary (Last 24 hours) at 07/14/2017 1047 Last data filed at 07/14/2017 5188 Gross per 24 hour  Intake 373 ml  Output 362 ml  Net 11 ml   Filed Weights   07/12/17 0521 07/13/17 0457 07/14/17 0510  Weight: 94.1 kg (207 lb 8 oz) 94 kg (207 lb 3.2 oz) 94.1 kg (207 lb 6.4 oz)    Examination:  General exam: Appears calm and comfortable  Respiratory system: Clear to auscultation. Respiratory effort normal. Cardiovascular system: S1 & S2 heard, RRR. No JVD, murmurs, rubs, gallops or clicks. No pedal edema. Gastrointestinal system: Abdomen is nondistended, soft and nontender. No organomegaly or masses felt. Normal bowel sounds heard.  Slight blood-tinged drainage from the drain left lower quadrant Central nervous system: Alert and oriented. No focal neurological deficits.  Extremities: Symmetric 5 x 5 power. Skin: No rashes, lesions or ulcers Psychiatry: Judgement and insight appear normal. Mood & affect appropriate.     Data  Reviewed: I have personally reviewed following labs and imaging studies  CBC: Recent Labs  Lab 07/11/17 0359 07/13/17 0430 07/14/17 0446  WBC 7.7 7.2 6.8  NEUTROABS  --   --  3.5  HGB 9.9* 10.9* 10.6*  HCT 31.1* 34.2* 33.6*  MCV 92.0 91.4 92.6  PLT 254 319 564   Basic Metabolic Panel: Recent Labs  Lab 07/10/17 0336 07/11/17 0359 07/14/17 0446  NA 138 137 137  K 3.4* 3.1* 3.1*  CL 97* 96* 95*  CO2 31 30 33*  GLUCOSE 94 94 104*  BUN 11 11 10   CREATININE 1.05* 1.00 1.09*  CALCIUM 9.0 8.9 9.1   GFR: Estimated Creatinine Clearance: 48.5 mL/min (A) (by C-G formula based on SCr of 1.09 mg/dL (H)). Liver Function Tests: No results for input(s): AST, ALT, ALKPHOS, BILITOT, PROT, ALBUMIN in the last 168 hours. No results for input(s): LIPASE, AMYLASE in the last 168 hours. No results for input(s): AMMONIA in the last 168 hours. Coagulation Profile: Recent Labs  Lab 07/10/17 0336 07/11/17 0359 07/12/17 0610 07/13/17 0430 07/14/17 0446  INR 2.01 1.76 1.37 1.24 1.27   Cardiac Enzymes: No results for input(s): CKTOTAL, CKMB, CKMBINDEX, TROPONINI in the last 168 hours. BNP (last 3 results) Recent Labs    11/29/16 1107  PROBNP 834*   HbA1C: No results for input(s): HGBA1C in the last 72 hours. CBG: Recent Labs  Lab 07/10/17 0502 07/11/17 0754 07/12/17 0609 07/13/17 0553 07/14/17 0631  GLUCAP 100* 103* 89 95 95   Lipid Profile: No results for input(s): CHOL, HDL, LDLCALC, TRIG, CHOLHDL, LDLDIRECT in the last 72 hours. Thyroid Function Tests: No results for input(s): TSH, T4TOTAL, FREET4, T3FREE, THYROIDAB in the last 72 hours. Anemia Panel: No results for input(s): VITAMINB12, FOLATE, FERRITIN, TIBC, IRON, RETICCTPCT in the last 72 hours. Sepsis Labs: No results for input(s): PROCALCITON, LATICACIDVEN in the last 168 hours.  Recent Results (from the past 240 hour(s))  Culture, blood (Routine X 2) w Reflex to ID Panel     Status: None   Collection Time:  07/07/17  2:45 AM  Result Value Ref Range Status   Specimen Description BLOOD LEFT FOREARM  Final   Special Requests   Final    BOTTLES DRAWN AEROBIC AND ANAEROBIC Blood Culture adequate volume   Culture NO GROWTH 5 DAYS  Final   Report Status 07/12/2017 FINAL  Final  Culture, blood (Routine X 2) w Reflex to ID Panel     Status: None   Collection Time: 07/07/17  2:54 AM  Result Value Ref Range Status   Specimen Description BLOOD RIGHT HAND  Final   Special Requests   Final    BOTTLES DRAWN AEROBIC AND ANAEROBIC Blood Culture adequate volume   Culture NO GROWTH 5 DAYS  Final   Report Status 07/12/2017 FINAL  Final  Aerobic/Anaerobic Culture (surgical/deep wound)     Status: None (Preliminary result)   Collection Time: 07/12/17 10:20 AM  Result Value Ref Range Status   Specimen Description ABSCESS  Final   Special Requests CT LLQ DRAIN  Final   Gram Stain   Final    ABUNDANT WBC PRESENT, PREDOMINANTLY PMN NO ORGANISMS SEEN    Culture   Final    MODERATE GRAM NEGATIVE RODS IDENTIFICATION AND SUSCEPTIBILITIES TO FOLLOW    Report Status PENDING  Incomplete  Radiology Studies: No results found.      Scheduled Meds: . cloNIDine  0.1 mg Oral BID  . feeding supplement  1 Container Oral TID BM  . loratadine  10 mg Oral Daily  . losartan  100 mg Oral Daily  . metoprolol succinate  50 mg Oral Daily  . mirtazapine  30 mg Oral QHS  . montelukast  10 mg Oral QHS  . pantoprazole  40 mg Oral Daily  . sodium chloride flush  5 mL Intravenous Q8H  . spironolactone  12.5 mg Oral Daily  . torsemide  60 mg Oral Daily  . triamcinolone cream  1 application Topical BID  . warfarin  3 mg Oral ONCE-1800  . Warfarin - Pharmacist Dosing Inpatient   Does not apply q1800   Continuous Infusions: . piperacillin-tazobactam (ZOSYN)  IV 3.375 g (07/14/17 1029)     LOS: 7 days     Georgette Shell, MD [facility]  If 7PM-7AM, please contact  night-coverage [email or web address] Password [facility] 07/14/2017, 10:47 AM

## 2017-07-14 NOTE — Care Management Important Message (Signed)
Important Message  Patient Details  Name: [name] MRN: [id number] Date of Birth: 1935/03/15   Medicare Important Message Given:  Yes    Nathen Helget 07/14/2017, 12:49 PM

## 2017-07-14 NOTE — Progress Notes (Signed)
Subjective/Chief Complaint: No complaints   Objective: Vital signs in last 24 hours: Temp:  [97.5 F (36.4 C)-98.8 F (37.1 C)] 97.5 F (36.4 C) (11/08 0510) Pulse Rate:  [64-77] 77 (11/08 0809) Resp:  [18] 18 (11/08 0510) BP: (139-164)/(57-73) 164/70 (11/08 0809) SpO2:  [93 %-98 %] 94 % (11/08 0809) Weight:  [94.1 kg (207 lb 6.4 oz)] 94.1 kg (207 lb 6.4 oz) (11/08 0510) Last BM Date: 07/12/17  Intake/Output from previous day: 11/07 0701 - 11/08 0700 In: 856 [P.O.:540; I.V.:6; IV Piggyback:300] Out: 662 [Urine:652; Drains:10] Intake/Output this shift: No intake/output data recorded.  General appearance: alert and cooperative Resp: clear to auscultation bilaterally Cardio: regular rate and rhythm GI: soft, minimal tenderness  Lab Results:  Recent Labs    07/13/17 0430 07/14/17 0446  WBC 7.2 6.8  HGB 10.9* 10.6*  HCT 34.2* 33.6*  PLT 319 339   BMET Recent Labs    07/14/17 0446  NA 137  K 3.1*  CL 95*  CO2 33*  GLUCOSE 104*  BUN 10  CREATININE 1.09*  CALCIUM 9.1   PT/INR Recent Labs    07/13/17 0430 07/14/17 0446  LABPROT 15.5* 15.8*  INR 1.24 1.27   ABG No results for input(s): PHART, HCO3 in the last 72 hours.  Invalid input(s): PCO2, PO2  Studies/Results: Ct Image Guided Drainage By Percutaneous Catheter  Result Date: 07/12/2017 CLINICAL DATA:  Persistent left pelvic diverticular abscess. EXAM: CT GUIDED DRAINAGE OF LEFT PELVIC ABSCESS ANESTHESIA/SEDATION: Intravenous Fentanyl and Versed were administered as conscious sedation during continuous monitoring of the patient's level of consciousness and physiological / cardiorespiratory status by the radiology RN, with a total moderate sedation time of 12 minutes. PROCEDURE: The procedure, risks, benefits, and alternatives were explained to the patient. Questions regarding the procedure were encouraged and answered. The patient understands and consents to the procedure. Select axial scans through  the pelvis obtained. The left collection was localized, corresponding to that seen on prior CT, and an appropriate skin entry site determined and marked. The operative field was prepped with chlorhexidinein a sterile fashion, and a sterile drape was applied covering the operative field. A sterile gown and sterile gloves were used for the procedure. Local anesthesia was provided with 1% Lidocaine. Under CT fluoroscopic guidance, 18 gauge trocar needle advanced to the collection. Purulent material could be aspirated. Amplatz guidewire advanced easily within the collection, position confirmed on CT. Tract dilated to facilitate placement of a 10 French pigtail catheter, formed centrally within the collection. 5 mL of greenish purulent aspirate were sent for Gram stain and culture. Catheter secured externally with 0 Prolene suture, flushed, and placed to gravity drain bag. The patient tolerated the procedure well. COMPLICATIONS: None immediate FINDINGS: Loculated gas and fluid collection in the left pelvis again localized. Percutaneous drain catheter placed as above. Sample of the aspirate sent for Gram stain and culture. IMPRESSION: 1. Technically successful CT-guided left pelvic abscess drain catheter placement. Electronically Signed   By: Lucrezia Europe M.D.   On: 07/12/2017 10:31    Anti-infectives: Anti-infectives (From admission, onward)   Start     Dose/Rate Route Frequency Ordered Stop   07/07/17 1000  piperacillin-tazobactam (ZOSYN) IVPB 3.375 g     3.375 g 12.5 mL/hr over 240 Minutes Intravenous Every 8 hours 07/07/17 0902     07/07/17 0800  piperacillin-tazobactam (ZOSYN) IVPB 3.375 g  Status:  Discontinued     3.375 g 12.5 mL/hr over 240 Minutes Intravenous Every 8 hours 07/07/17 0217 07/07/17  9675   07/07/17 0130  piperacillin-tazobactam (ZOSYN) IVPB 3.375 g     3.375 g 100 mL/hr over 30 Minutes Intravenous  Once 07/07/17 0125 07/07/17 0335      Assessment/Plan: s/p * No surgery found  * Advance diet  Continue abx and drain Consider switching to oral abx  F/u with [place] in 2 weeks and in drain clinic  LOS: 7 days    TOTH III,Taseen Marasigan S 07/14/2017

## 2017-07-15 LAB — GLUCOSE, CAPILLARY: GLUCOSE-CAPILLARY: 107 mg/dL — AB (ref 65–99)

## 2017-07-15 LAB — PROTIME-INR
INR: 1.39
Prothrombin Time: 16.9 seconds — ABNORMAL HIGH (ref 11.4–15.2)

## 2017-07-15 MED ORDER — WARFARIN SODIUM 5 MG PO TABS
5.0000 mg | ORAL_TABLET | Freq: Once | ORAL | Status: AC
  Administered 2017-07-15: 5 mg via ORAL
  Filled 2017-07-15: qty 1

## 2017-07-15 MED ORDER — DEXTROSE 5 % IV SOLN
2.0000 g | INTRAVENOUS | Status: DC
  Administered 2017-07-15: 2 g via INTRAVENOUS
  Filled 2017-07-15 (×2): qty 2

## 2017-07-15 NOTE — Progress Notes (Signed)
[facility] Progress Note     Subjective: CC:  Eager to go home. States she thinks she gets her drain out tomorrow. Denies abdominal pain - just local soreness around drain. Tolerating PO and having bowel movements.  Objective: Vital signs in last 24 hours: Temp:  [97.4 F (36.3 C)-98.6 F (37 C)] 98.3 F (36.8 C) (11/09 0515) Pulse Rate:  [56-66] 66 (11/09 0515) Resp:  [18] 18 (11/09 0515) BP: (108-178)/(46-66) 178/66 (11/09 0515) SpO2:  [95 %-97 %] 96 % (11/09 0515) Weight:  [94.6 kg (208 lb 9.6 oz)] 94.6 kg (208 lb 9.6 oz) (11/09 0515) Last BM Date: 07/15/17  Intake/Output from previous day: 11/08 0701 - 11/09 0700 In: 1080 [P.O.:1080] Out: 10 [Drains:10] Intake/Output this shift: Total I/O In: 480 [P.O.:480] Out: -   PE: Gen: Alert, NAD, pleasant, sitting up in chair  HEENT: EOM's intact, pupils equal and round Card:Irregularly irregular Pulm: CTAB, no W/R/R, effort normal Abd: Soft,ND, +BS, no HSM, no hernia,drain LLQ with minimal pink/sanguinous drainage in bad, appropriately TTP around drain Psych: A&Ox3  Skin: no rashes noted, warm and dry    Lab Results:  Recent Labs    07/13/17 0430 07/14/17 0446  WBC 7.2 6.8  HGB 10.9* 10.6*  HCT 34.2* 33.6*  PLT 319 339   BMET Recent Labs    07/14/17 0446  NA 137  K 3.1*  CL 95*  CO2 33*  GLUCOSE 104*  BUN 10  CREATININE 1.09*  CALCIUM 9.1   PT/INR Recent Labs    07/14/17 0446 07/15/17 0523  LABPROT 15.8* 16.9*  INR 1.27 1.39   CMP     Component Value Date/Time   NA 137 07/14/2017 0446   NA 139 05/30/2017 1039   NA 140 08/08/2014 0951   K 3.1 (L) 07/14/2017 0446   K 4.2 08/08/2014 0951   CL 95 (L) 07/14/2017 0446   CL 102 02/09/2013 0947   CO2 33 (H) 07/14/2017 0446   CO2 26 08/08/2014 0951   GLUCOSE 104 (H) 07/14/2017 0446   GLUCOSE 119 08/08/2014 0951   GLUCOSE 153 (H) 02/09/2013 0947   BUN 10 07/14/2017 0446   BUN 19 05/30/2017 1039   BUN 14.4 08/08/2014 0951   CREATININE 1.09 (H) 07/14/2017 0446   CREATININE 1.04 (H) 07/06/2017 1510   CREATININE 0.8 08/08/2014 0951   CALCIUM 9.1 07/14/2017 0446   CALCIUM 9.8 08/08/2014 0951   PROT 7.9 07/06/2017 1855   PROT 7.5 02/01/2017 0941   PROT 7.8 08/08/2014 0951   ALBUMIN 3.6 07/06/2017 1855   ALBUMIN 4.3 02/01/2017 0941   ALBUMIN 3.8 08/08/2014 0951   AST 22 07/06/2017 1855   AST 21 08/08/2014 0951   ALT 15 07/06/2017 1855   ALT 18 08/08/2014 0951   ALKPHOS 79 07/06/2017 1855   ALKPHOS 92 08/08/2014 0951   BILITOT 1.4 (H) 07/06/2017 1855   BILITOT 0.4 02/01/2017 0941   BILITOT 0.49 08/08/2014 0951   GFRNONAA 47 (L) 07/14/2017 0446   GFRNONAA 51 (L) 07/06/2017 1510   GFRAA 54 (L) 07/14/2017 0446   GFRAA 59 (L) 07/06/2017 1510   Lipase     Component Value Date/Time   LIPASE 70 (H) 02/26/2017 0252       Studies/Results: No results found.  Anti-infectives: Anti-infectives (From admission, onward)   Start     Dose/Rate Route Frequency Ordered Stop   07/07/17 1000  piperacillin-tazobactam (ZOSYN) IVPB 3.375 g     3.375 g 12.5 mL/hr over 240 Minutes Intravenous Every  8 hours 07/07/17 0902     07/07/17 0800  piperacillin-tazobactam (ZOSYN) IVPB 3.375 g  Status:  Discontinued     3.375 g 12.5 mL/hr over 240 Minutes Intravenous Every 8 hours 07/07/17 0217 07/07/17 0902   07/07/17 0130  piperacillin-tazobactam (ZOSYN) IVPB 3.375 g     3.375 g 100 mL/hr over 30 Minutes Intravenous  Once 07/07/17 0125 07/07/17 0335       Assessment/Plan History of colon cancer with right hemicolectomy and chemotherapy,2003 History of left breast cancer with lumpectomy and chemotherapy. CAD AtrialFibrillationon coumadin -INR 1.24this AM, holding coumadin Chronic dCHF - torsemide, spironolactone, metoprolol COPD/history of pulmonaryHTN HLD HTN H/oGERD/hiatal hernia.  Pelvic abscess/most likely diverticulitis - s/p pelvic drain placement 11/6 Dr. Vernard Gambles - afebrile, normal WBC - culture  pending   FEN:IVF, FLD, protein shakes ID: Zosyn 11/1 >>day#7; leukocytosis resolved  DVT: SCD's, coumadin Foley: None Follow up: TBD  Plan: transition to PO abx based on sensitivities. stable for discharge from surgical perspective with surgical follow up. Repeat Imaging and drain removal per IR.     LOS: 8 days    [facility] 07/15/2017, 10:28 AM Pager: [phone] Consults: [phone] Mon-Fri 7:00 am-4:30 pm Sat-Sun 7:00 am-11:30 am

## 2017-07-15 NOTE — Plan of Care (Signed)
  Skin Integrity: Risk for impaired skin integrity will decrease 07/15/2017 0121 - Progressing by Lamontae Ricardo, Roma Kayser, RN

## 2017-07-15 NOTE — Progress Notes (Signed)
Referring Physician(s): Dr. Marthenia Rolling  Supervising Physician: Corrie Mckusick  Patient Status:  [facility] - In-pt  Chief Complaint: Intra-abdominal abscess  Subjective: Soreness at drain site, but abdominal pain improved. Asking about going home.    Allergies: Contrast media [iodinated diagnostic agents]; Clonidine derivatives; Statins; Sulfa antibiotics; Celebrex [celecoxib]; Isosorbide nitrate; Other; and Tape  Medications: Prior to Admission medications   Medication Sig Start Date End Date Taking? Authorizing Provider  acetaminophen (TYLENOL) 325 MG tablet Take 650 mg by mouth every 6 (six) hours as needed for mild pain. Reported on 03/15/2016   Yes [provider]  amLODipine (NORVASC) 5 MG tablet Take 1 tablet (5 mg total) by mouth daily. 03/25/17  Yes Clegg, Amy D, NP  cetirizine (ZYRTEC) 10 MG tablet Take 10 mg by mouth daily as needed for allergies.   Yes [provider]  clonazePAM (KLONOPIN) 0.5 MG tablet Take 1 tablet (0.5 mg total) by mouth 2 (two) times daily as needed for anxiety. 04/04/17  Yes Dena Billet B, PA-C  cloNIDine (CATAPRES) 0.1 MG tablet TAKE ONE TABLET BY MOUTH TWICE DAILY 12/14/16  Yes Turner, Eber Hong, MD  guaiFENesin (MUCINEX) 600 MG 12 hr tablet Take 600 mg by mouth daily as needed for cough or to loosen phlegm.   Yes [provider]  HYDROcodone-acetaminophen (NORCO) 7.5-325 MG tablet Take 1 tablet by mouth every 6 (six) hours as needed for moderate pain. 04/25/17  Yes Dena Billet B, PA-C  losartan (COZAAR) 100 MG tablet Take 1 tablet (100 mg total) by mouth daily. 03/26/17  Yes Clegg, Amy D, NP  metoprolol succinate (TOPROL-XL) 50 MG 24 hr tablet TAKE ONE TABLET BY MOUTH ONCE DAILY TAKE  WITH  OR  FOLLOWING  A  MEAL 10/26/16  Yes Dena Billet B, PA-C  mirtazapine (REMERON) 30 MG tablet Take 1 tablet (30 mg total) by mouth at bedtime. 05/04/17  Yes Dena Billet B, PA-C  montelukast (SINGULAIR) 10 MG tablet Take 1 tablet (10 mg total) by mouth at  bedtime. Patient taking differently: Take 10 mg by mouth at bedtime as needed (for allergies).  04/28/16  Yes Orlena Sheldon, PA-C  nitroGLYCERIN (NITROSTAT) 0.4 MG SL tablet Place 1 tablet (0.4 mg total) under the tongue every 5 (five) minutes as needed for chest pain (x 3 pills). 10/23/15  Yes Orlena Sheldon, PA-C  omeprazole (PRILOSEC) 20 MG capsule Take 1 capsule (20 mg total) by mouth daily as needed (heartburn, acid reflux). 09/25/15  Yes Dixon, Mary B, PA-C  OVER THE COUNTER MEDICATION Place 1 drop into both eyes daily as needed (dry eyes). Eye drops   Yes [provider]  spironolactone (ALDACTONE) 25 MG tablet TAKE ONE-HALF TABLET BY MOUTH ONCE DAILY 12/28/16  Yes Turner, Eber Hong, MD  torsemide (DEMADEX) 20 MG tablet Take 60 mg by mouth daily.    Yes [provider]  triamcinolone cream (KENALOG) 0.1 % Apply 1 application topically 2 (two) times daily. 12/31/16  Yes [place], Modena Nunnery, MD  warfarin (COUMADIN) 1 MG tablet Take 0.5 tablets (0.5 mg total) by mouth as directed. M-W-F-SA Patient taking differently: Take 3 mg by mouth See admin instructions. Mon/Wed/Fri/Sat 07/08/16  Yes Orlena Sheldon, PA-C  warfarin (COUMADIN) 5 MG tablet Take 0.5 tablets (2.5 mg total) by mouth See admin instructions. Take half a tablet (2.5 mg) on Tuesdays, Thursdays, and Sundays. Take half a tablet with half a tablet of 1 mg (total 3 mg) on all other days. Patient taking  differently: Take 2.5 mg by mouth See admin instructions. Take 1/2 tablet  on Tuesdays, Thursdays, and Sundays. 07/08/16  Yes Dena Billet B, PA-C     Vital Signs: BP (!) 159/64 (BP Location: Right Arm)   Pulse 68   Temp (!) 97.5 F (36.4 C) (Oral)   Resp 18   Ht 5\' 7"  (1.702 m)   Wt 208 lb 9.6 oz (94.6 kg) Comment: scale b  SpO2 97%   BMI 32.67 kg/m   Physical Exam  NAD, alert Abd:  LLQ drain in place.  Insertion site c/d/i.  Blood-tinged output in bag. Only 10 mL recorded overnight. Very little in bag this afternoon.    Imaging: Ct Image Guided Drainage By Percutaneous Catheter  Result Date: 07/12/2017 CLINICAL DATA:  Persistent left pelvic diverticular abscess. EXAM: CT GUIDED DRAINAGE OF LEFT PELVIC ABSCESS ANESTHESIA/SEDATION: Intravenous Fentanyl and Versed were administered as conscious sedation during continuous monitoring of the patient's level of consciousness and physiological / cardiorespiratory status by the radiology RN, with a total moderate sedation time of 12 minutes. PROCEDURE: The procedure, risks, benefits, and alternatives were explained to the patient. Questions regarding the procedure were encouraged and answered. The patient understands and consents to the procedure. Select axial scans through the pelvis obtained. The left collection was localized, corresponding to that seen on prior CT, and an appropriate skin entry site determined and marked. The operative field was prepped with chlorhexidinein a sterile fashion, and a sterile drape was applied covering the operative field. A sterile gown and sterile gloves were used for the procedure. Local anesthesia was provided with 1% Lidocaine. Under CT fluoroscopic guidance, 18 gauge trocar needle advanced to the collection. Purulent material could be aspirated. Amplatz guidewire advanced easily within the collection, position confirmed on CT. Tract dilated to facilitate placement of a 10 French pigtail catheter, formed centrally within the collection. 5 mL of greenish purulent aspirate were sent for Gram stain and culture. Catheter secured externally with 0 Prolene suture, flushed, and placed to gravity drain bag. The patient tolerated the procedure well. COMPLICATIONS: None immediate FINDINGS: Loculated gas and fluid collection in the left pelvis again localized. Percutaneous drain catheter placed as above. Sample of the aspirate sent for Gram stain and culture. IMPRESSION: 1. Technically successful CT-guided left pelvic abscess drain catheter placement.  Electronically Signed   By: Lucrezia Europe M.D.   On: 07/12/2017 10:31    Labs:  CBC: Recent Labs    07/07/17 0244 07/11/17 0359 07/13/17 0430 07/14/17 0446  WBC 13.3* 7.7 7.2 6.8  HGB 10.7* 9.9* 10.9* 10.6*  HCT 32.5* 31.1* 34.2* 33.6*  PLT 237 254 319 339    COAGS: Recent Labs    07/07/17 0236  07/12/17 0610 07/13/17 0430 07/14/17 0446 07/15/17 0523  INR 2.10   < > 1.37 1.24 1.27 1.39  APTT 56*  --   --   --   --   --    < > = values in this interval not displayed.    BMP: Recent Labs    07/07/17 0244 07/10/17 0336 07/11/17 0359 07/14/17 0446  NA 131* 138 137 137  K 3.3* 3.4* 3.1* 3.1*  CL 93* 97* 96* 95*  CO2 25 31 30  33*  GLUCOSE 111* 94 94 104*  BUN 16 11 11 10   CALCIUM 9.2 9.0 8.9 9.1  CREATININE 1.01* 1.05* 1.00 1.09*  GFRNONAA 51* 49* 52* 47*  GFRAA 59* 57* >60 54*    LIVER FUNCTION TESTS: Recent Labs  02/01/17 0941 02/26/17 0252 03/24/17 1229 07/06/17 1510 07/06/17 1855  BILITOT 0.4 0.6 0.4 1.2 1.4*  AST 20 32 21 19 22   ALT 18 23 16 11 15   ALKPHOS 82 76 80  --  79  PROT 7.5 7.2 8.0 7.0 7.9  ALBUMIN 4.3 3.8 3.7  --  3.6    Assessment and Plan: Intra-abdominal fluid collection s/p drain placement 07/12/17 Drain intact with small amount of serous output.  [place] be nearing discharge.  Will place orders for outpatient follow-up in IR drain clinic.  IR to follow.   Electronically Signed: Docia Barrier, PA 07/15/2017, 4:55 PM   I spent a total of 15 Minutes at the the patient's bedside AND on the patient's hospital floor or unit, greater than 50% of which was counseling/coordinating care for intra-abdominal fluid collection.

## 2017-07-15 NOTE — Progress Notes (Signed)
PROGRESS NOTE    [name]  YNW:[id number] DOB: 22-Jun-1935 DOA: 07/07/2017 PCP: Orlena Sheldon, PA-C  Brief Narrative: 82 y.o.femalewith medical history significant ofhypertension, hyperlipidemia, COPD, GERD, depression, slightly, pulmonary hypertension,Takotsubosyndrome, atrial fibrillation on Coumadin, CAD, aortic root dilation,dCHF, colon cancer (s/p pfpartialresection and radiation therapy),breast cancer (s/p ofleft lumpectomy), who presents with abdominal pain.  Patient states that she has been having intermittent abdominal pain for about a six-month,which has worsened in the past 3 days. Her abdominal pain is located in mainly in the lower abdomen, but also involvingwhole abdomen, which is constant, 8 out of 10 in severity, sharp, nonradiating. It is not associated with nausea, vomiting. She has chronicdiarrhea,but not today. Denies fever or chills. Patient does not have chest pain, shortness breath, cough.She states that she has a little burning on urination, but denies dysuria or urgency.Pt was seen by PCP and hadCT scanwhichshowed diverticulitis with complex adnexal abscess. She has had a colonoscopy by Dr. Cristina Gong approximately 5 years ago.  ED Course:pt was found to haveWBC 13.3, lactic acid 1.36, negative urinalysis, electrolytes and renal function okay, temperature 99.1, no tachycardia, no tachypnea, oxygen saturation 95%onRA. Patient had drain placed by radiology under CT guidance to the left lower quadrant. She is complaining of pain at the site of the drain. She had bowel movements loose.   Culture grew Citrobacter f SENSITIVE TO rocephin.  Assessment & Plan:   Principal Problem:   Pelvic abscess in female Active Problems:   GERD (gastroesophageal reflux disease)   Hypertension   COPD (chronic obstructive pulmonary disease) (HCC)   Coronary artery disease   Permanent atrial fibrillation (HCC)   Chronic anticoagulation   Chronic diastolic CHF  (congestive heart failure) (HCC)  Pelvic abscess/diverticulitis status post drain placement 07/12/2017 by IR.culture grew citrobacetr feundii sensitive to rocephin.will dc zosyn and start rocephin.had 10cc drainage overnite.IR following ?possible dc drain soon.  Chronic atrial fibrillation-coumadin restarted.Continue metoprolol.  Chronic diastolic CHF stable on torsemide and spironolactone continue metoprolol.     DVT prophylaxis:coumadin Code Status:full Family Communication:  none Disposition Plan:home with po antibiotics once drain removed.  Consultants-ir :    Procedures:jp drain LLQ Antimicrobials:zosyn  Subjective:wants to go home   Objective:sitting by the edge of bed in nad. Vitals:   07/14/17 0809 07/14/17 1214 07/14/17 1929 07/15/17 0515  BP: (!) 164/70 (!) 140/56 (!) 108/46 (!) 178/66  Pulse: 77 (!) 56 60 66  Resp:  18 18 18   Temp:  (!) 97.4 F (36.3 C) 98.6 F (37 C) 98.3 F (36.8 C)  TempSrc:  Oral Oral Oral  SpO2: 94% 95% 97% 96%  Weight:    94.6 kg (208 lb 9.6 oz)  Height:        Intake/Output Summary (Last 24 hours) at 07/15/2017 1149 Last data filed at 07/15/2017 0919 Gross per 24 hour  Intake 1320 ml  Output 10 ml  Net 1310 ml   Filed Weights   07/13/17 0457 07/14/17 0510 07/15/17 0515  Weight: 94 kg (207 lb 3.2 oz) 94.1 kg (207 lb 6.4 oz) 94.6 kg (208 lb 9.6 oz)    Examination:  General exam: Appears calm and comfortable  Respiratory system: Clear to auscultation. Respiratory effort normal. Cardiovascular system: S1 & S2 heard, RRR. No JVD, murmurs, rubs, gallops or clicks. No pedal edema. Gastrointestinal system: Abdomen is nondistended, soft and nontender. No organomegaly or masses felt. Normal bowel sounds heard.drain in place with slight blood tinged drainage. Central nervous system: Alert and oriented. No focal neurological deficits.  Extremities: Symmetric 5 x 5 power. Skin: No rashes, lesions or ulcers Psychiatry: Judgement and  insight appear normal. Mood & affect appropriate.     Data Reviewed: I have personally reviewed following labs and imaging studies  CBC: Recent Labs  Lab 07/11/17 0359 07/13/17 0430 07/14/17 0446  WBC 7.7 7.2 6.8  NEUTROABS  --   --  3.5  HGB 9.9* 10.9* 10.6*  HCT 31.1* 34.2* 33.6*  MCV 92.0 91.4 92.6  PLT 254 319 867   Basic Metabolic Panel: Recent Labs  Lab 07/10/17 0336 07/11/17 0359 07/14/17 0446  NA 138 137 137  K 3.4* 3.1* 3.1*  CL 97* 96* 95*  CO2 31 30 33*  GLUCOSE 94 94 104*  BUN 11 11 10   CREATININE 1.05* 1.00 1.09*  CALCIUM 9.0 8.9 9.1   GFR: Estimated Creatinine Clearance: 48.6 mL/min (A) (by C-G formula based on SCr of 1.09 mg/dL (H)). Liver Function Tests: No results for input(s): AST, ALT, ALKPHOS, BILITOT, PROT, ALBUMIN in the last 168 hours. No results for input(s): LIPASE, AMYLASE in the last 168 hours. No results for input(s): AMMONIA in the last 168 hours. Coagulation Profile: Recent Labs  Lab 07/11/17 0359 07/12/17 0610 07/13/17 0430 07/14/17 0446 07/15/17 0523  INR 1.76 1.37 1.24 1.27 1.39   Cardiac Enzymes: No results for input(s): CKTOTAL, CKMB, CKMBINDEX, TROPONINI in the last 168 hours. BNP (last 3 results) Recent Labs    11/29/16 1107  PROBNP 834*   HbA1C: No results for input(s): HGBA1C in the last 72 hours. CBG: Recent Labs  Lab 07/11/17 0754 07/12/17 0609 07/13/17 0553 07/14/17 0631 07/15/17 0603  GLUCAP 103* 89 95 95 107*   Lipid Profile: No results for input(s): CHOL, HDL, LDLCALC, TRIG, CHOLHDL, LDLDIRECT in the last 72 hours. Thyroid Function Tests: No results for input(s): TSH, T4TOTAL, FREET4, T3FREE, THYROIDAB in the last 72 hours. Anemia Panel: No results for input(s): VITAMINB12, FOLATE, FERRITIN, TIBC, IRON, RETICCTPCT in the last 72 hours. Sepsis Labs: No results for input(s): PROCALCITON, LATICACIDVEN in the last 168 hours.  Recent Results (from the past 240 hour(s))  Culture, blood (Routine X 2)  w Reflex to ID Panel     Status: None   Collection Time: 07/07/17  2:45 AM  Result Value Ref Range Status   Specimen Description BLOOD LEFT FOREARM  Final   Special Requests   Final    BOTTLES DRAWN AEROBIC AND ANAEROBIC Blood Culture adequate volume   Culture NO GROWTH 5 DAYS  Final   Report Status 07/12/2017 FINAL  Final  Culture, blood (Routine X 2) w Reflex to ID Panel     Status: None   Collection Time: 07/07/17  2:54 AM  Result Value Ref Range Status   Specimen Description BLOOD RIGHT HAND  Final   Special Requests   Final    BOTTLES DRAWN AEROBIC AND ANAEROBIC Blood Culture adequate volume   Culture NO GROWTH 5 DAYS  Final   Report Status 07/12/2017 FINAL  Final  Aerobic/Anaerobic Culture (surgical/deep wound)     Status: None (Preliminary result)   Collection Time: 07/12/17 10:20 AM  Result Value Ref Range Status   Specimen Description ABSCESS  Final   Special Requests CT LLQ DRAIN  Final   Gram Stain   Final    ABUNDANT WBC PRESENT, PREDOMINANTLY PMN NO ORGANISMS SEEN    Culture   Final    ABUNDANT CITROBACTER SPECIES CULTURE REINCUBATED FOR BETTER GROWTH HOLDING FOR POSSIBLE ANAEROBE    Report Status  PENDING  Incomplete   Organism ID, Bacteria CITROBACTER SPECIES  Final      Susceptibility   Citrobacter species - MIC*    CEFAZOLIN >=64 RESISTANT Resistant     CEFEPIME <=1 SENSITIVE Sensitive     CEFTAZIDIME <=1 SENSITIVE Sensitive     CEFTRIAXONE <=1 SENSITIVE Sensitive     CIPROFLOXACIN <=0.25 SENSITIVE Sensitive     GENTAMICIN <=1 SENSITIVE Sensitive     IMIPENEM <=0.25 SENSITIVE Sensitive     TRIMETH/SULFA <=20 SENSITIVE Sensitive     PIP/TAZO <=4 SENSITIVE Sensitive     * ABUNDANT CITROBACTER SPECIES         Radiology Studies: No results found.      Scheduled Meds: . cloNIDine  0.1 mg Oral BID  . feeding supplement  1 Container Oral TID BM  . loratadine  10 mg Oral Daily  . losartan  100 mg Oral Daily  . metoprolol succinate  50 mg Oral  Daily  . mirtazapine  30 mg Oral QHS  . montelukast  10 mg Oral QHS  . pantoprazole  40 mg Oral Daily  . sodium chloride flush  5 mL Intravenous Q8H  . spironolactone  12.5 mg Oral Daily  . torsemide  60 mg Oral Daily  . triamcinolone cream  1 application Topical BID  . warfarin  5 mg Oral ONCE-1800  . Warfarin - Pharmacist Dosing Inpatient   Does not apply q1800   Continuous Infusions: . piperacillin-tazobactam (ZOSYN)  IV 3.375 g (07/15/17 0221)     LOS: 8 days    Georgette Shell, MD [facility] If 7PM-7AM, please contact night-coverage [email or web address] Password [email or web address] 07/15/2017, 11:49 AM

## 2017-07-15 NOTE — Progress Notes (Addendum)
ANTICOAGULATION CONSULT NOTE  Pharmacy Consult:  Coumadin Indication: atrial fibrillation  Allergies  Allergen Reactions  . Contrast Media [Iodinated Diagnostic Agents] Other (See Comments) and Hives    Per pt strong burning sensation starting in chest radiating outward Per pt strong burning sensation starting in chest radiating outward  . Clonidine Derivatives Other (See Comments)    Throat dry  . Statins Other (See Comments)    "bones hurt"  . Sulfa Antibiotics Diarrhea    Tremors   . Celebrex [Celecoxib] Rash  . Isosorbide Nitrate Itching and Rash  . Other Itching and Rash    Plastic and paper tape and heart monitor pads  . Tape Itching and Rash    Red Where applied and will spread    Patient Measurements: Height: 5\' 7"  (170.2 cm) Weight: 208 lb 9.6 oz (94.6 kg)(scale b) IBW/kg (Calculated) : 61.6  Vital Signs: Temp: 98.3 F (36.8 C) (11/09 0515) Temp Source: Oral (11/09 0515) BP: 178/66 (11/09 0515) Pulse Rate: 66 (11/09 0515)  Labs: Recent Labs    07/13/17 0430 07/14/17 0446 07/15/17 0523  HGB 10.9* 10.6*  --   HCT 34.2* 33.6*  --   PLT 319 339  --   LABPROT 15.5* 15.8* 16.9*  INR 1.24 1.27 1.39  CREATININE  --  1.09*  --     Estimated Creatinine Clearance: 48.6 mL/min (A) (by C-G formula based on SCr of 1.09 mg/dL (H)).    Assessment: 51 YOF admitted 07/07/2017 with pelvic abscess s/p drain placement by IR on 07/12/17.  Patient has a history of Afib and Coumadin was resumed on 07/13/17.  INR remains sub-therapeutic as expected; no bleeding reported.  PTA regimen: 3 mg daily exc 2.5 mg TTSu (has 5 and 1 mg tabs)   Goal of Therapy:  INR 2-3 Monitor platelets by anticoagulation protocol: Yes    Plan:  Coumadin 5mg  PO today Daily PT / INR F/U resume Norvasc, supplement potassium, narrow Zosyn to Rocephin or cefpodoxime    Maisen Klingler D. Mina Marble, PharmD, BCPS Pager:  [phone] 07/15/2017, 8:59 AM

## 2017-07-15 NOTE — Plan of Care (Signed)
Pt is alert and oriented walking to bathroom independently blp high stable was having increased pain at the time. Was since medicated with analgesic. MD waiting on cultures and continue antibiotics and plan to remove drainage bag be discharge ADD 11/10

## 2017-07-16 LAB — PROTIME-INR
INR: 1.56
Prothrombin Time: 18.5 seconds — ABNORMAL HIGH (ref 11.4–15.2)

## 2017-07-16 LAB — GLUCOSE, CAPILLARY: Glucose-Capillary: 103 mg/dL — ABNORMAL HIGH (ref 65–99)

## 2017-07-16 MED ORDER — HYDROCODONE-ACETAMINOPHEN 7.5-325 MG PO TABS: 1.0000 | ORAL_TABLET | Freq: Four times a day (QID) | ORAL | 0 refills | Status: DC | PRN

## 2017-07-16 MED ORDER — CEFPODOXIME PROXETIL 200 MG PO TABS: 400.0000 mg | ORAL_TABLET | Freq: Two times a day (BID) | ORAL | 0 refills | Status: DC

## 2017-07-16 MED ORDER — HYDROCODONE-ACETAMINOPHEN 7.5-325 MG PO TABS
2.0000 | ORAL_TABLET | Freq: Once | ORAL | Status: AC
  Administered 2017-07-16: 2 via ORAL
  Filled 2017-07-16: qty 2

## 2017-07-16 MED ORDER — HYDROCODONE-ACETAMINOPHEN 7.5-325 MG PO TABS: 1.0000 | ORAL_TABLET | Freq: Four times a day (QID) | ORAL | Status: DC | PRN

## 2017-07-16 NOTE — Care Management Note (Signed)
Case Management Note  Patient Details  Name: [name] MRN: [id number] Date of Birth: [birth year]  Subjective/Objective:     Pelvic Abscess             Action/Plan: Patient lives at home with spouse; PCP: Rennis Golden; has private insurance with BCBS with prescription drug coverage; pharmacy of choice is [facility]; no DME at this time; patient is going ho me with pelvic drain; [place] choice offered, pt chose [facility]; [place] with [facility] called for arrangements. Patient stated that her son will pick her up at discharge.  Expected Discharge Date:  07/16/17               Expected Discharge Plan:  [place]  Discharge planning Services  CM Consult  HH Arranged:  RN [facility] Agency:  [facility] (now [facility])  Status of Service:  In process, will continue to follow  Sherrilyn Rist [phone] 07/16/2017, 11:54 AM

## 2017-07-16 NOTE — Progress Notes (Signed)
Discharge instructions reviewed with patient, spouse and son, questions answered, verbalized understanding.  SCript for [employer] given to patient.  Patient transferred to main entrance of the hospital via wheelchair to be taken home by son.  Drain flushed earlier in the shift, very minimal serosanguineous fluid in bag at time of discharge.  Patient instructed on how to empty drain.

## 2017-07-16 NOTE — Plan of Care (Signed)
Patient up independently, maintains oxygen saturation on room air, pain controlled with Norco.

## 2017-07-16 NOTE — Progress Notes (Signed)
[facility] Progress Note     Subjective: CC:  Eager to go home. States she thinks she gets her drain out tomorrow. Denies abdominal pain - just local soreness around drain. Tolerating PO and having bowel movements.  Objective: Vital signs in last 24 hours: Temp:  [97.5 F (36.4 C)-98.1 F (36.7 C)] 97.8 F (36.6 C) (11/10 0500) Pulse Rate:  [57-68] 57 (11/10 0500) Resp:  [18] 18 (11/10 0500) BP: (147-160)/(54-64) 160/61 (11/10 0500) SpO2:  [96 %-99 %] 96 % (11/10 0500) Weight:  [95.1 kg (209 lb 9.6 oz)] 95.1 kg (209 lb 9.6 oz) (11/10 0500) Last BM Date: 07/15/17  Intake/Output from previous day: 11/09 0701 - 11/10 0700 In: 1095 [P.O.:1040; IV Piggyback:50] Out: 0272 [Urine:1850; Drains:15] Intake/Output this shift: No intake/output data recorded.  PE: Gen: Alert, NAD, pleasant, sitting up in bed Card:Irregularly irregular Pulm: normal respiratory effort Abd: Soft,NT;drain LLQ with minimal pink/sanguinous drainage in bag Psych: A&Ox3  Skin: no rashes noted, warm and dry    Lab Results:  Recent Labs    07/14/17 0446  WBC 6.8  HGB 10.6*  HCT 33.6*  PLT 339   BMET Recent Labs    07/14/17 0446  NA 137  K 3.1*  CL 95*  CO2 33*  GLUCOSE 104*  BUN 10  CREATININE 1.09*  CALCIUM 9.1   PT/INR Recent Labs    07/15/17 0523 07/16/17 0614  LABPROT 16.9* 18.5*  INR 1.39 1.56   CMP     Component Value Date/Time   NA 137 07/14/2017 0446   NA 139 05/30/2017 1039   NA 140 08/08/2014 0951   K 3.1 (L) 07/14/2017 0446   K 4.2 08/08/2014 0951   CL 95 (L) 07/14/2017 0446   CL 102 02/09/2013 0947   CO2 33 (H) 07/14/2017 0446   CO2 26 08/08/2014 0951   GLUCOSE 104 (H) 07/14/2017 0446   GLUCOSE 119 08/08/2014 0951   GLUCOSE 153 (H) 02/09/2013 0947   BUN 10 07/14/2017 0446   BUN 19 05/30/2017 1039   BUN 14.4 08/08/2014 0951   CREATININE 1.09 (H) 07/14/2017 0446   CREATININE 1.04 (H) 07/06/2017 1510   CREATININE 0.8 08/08/2014 0951   CALCIUM 9.1 07/14/2017 0446   CALCIUM 9.8 08/08/2014 0951   PROT 7.9 07/06/2017 1855   PROT 7.5 02/01/2017 0941   PROT 7.8 08/08/2014 0951   ALBUMIN 3.6 07/06/2017 1855   ALBUMIN 4.3 02/01/2017 0941   ALBUMIN 3.8 08/08/2014 0951   AST 22 07/06/2017 1855   AST 21 08/08/2014 0951   ALT 15 07/06/2017 1855   ALT 18 08/08/2014 0951   ALKPHOS 79 07/06/2017 1855   ALKPHOS 92 08/08/2014 0951   BILITOT 1.4 (H) 07/06/2017 1855   BILITOT 0.4 02/01/2017 0941   BILITOT 0.49 08/08/2014 0951   GFRNONAA 47 (L) 07/14/2017 0446   GFRNONAA 51 (L) 07/06/2017 1510   GFRAA 54 (L) 07/14/2017 0446   GFRAA 59 (L) 07/06/2017 1510   Lipase     Component Value Date/Time   LIPASE 70 (H) 02/26/2017 0252       Studies/Results: No results found.  Anti-infectives: Anti-infectives (From admission, onward)   Start     Dose/Rate Route Frequency Ordered Stop   07/15/17 1300  cefTRIAXone (ROCEPHIN) 2 g in dextrose 5 % 50 mL IVPB     2 g 100 mL/hr over 30 Minutes Intravenous Every 24 hours 07/15/17 1203     07/07/17 1000  piperacillin-tazobactam (ZOSYN) IVPB 3.375 g  Status:  Discontinued  3.375 g 12.5 mL/hr over 240 Minutes Intravenous Every 8 hours 07/07/17 0902 07/15/17 1203   07/07/17 0800  piperacillin-tazobactam (ZOSYN) IVPB 3.375 g  Status:  Discontinued     3.375 g 12.5 mL/hr over 240 Minutes Intravenous Every 8 hours 07/07/17 0217 07/07/17 0902   07/07/17 0130  piperacillin-tazobactam (ZOSYN) IVPB 3.375 g     3.375 g 100 mL/hr over 30 Minutes Intravenous  Once 07/07/17 0125 07/07/17 0335     Assessment/Plan History of colon cancer with right hemicolectomy and chemotherapy,2003 History of left breast cancer with lumpectomy and chemotherapy. CAD AtrialFibrillationon coumadin -INR 1.5this AM; on warfarin per medicine Chronic dCHF - torsemide, spironolactone, metoprolol COPD/history of pulmonaryHTN HLD HTN H/oGERD/hiatal hernia.  Pelvic abscess/most likely diverticulitis - s/p  pelvic drain placement 11/6 Dr. Vernard Gambles - afebrile, normal WBC yesterday  FEN:IVF, FLD, protein shakes ID: Zosyn 11/1 >>day#8; leukocytosis resolved  DVT: SCD's, coumadin Foley: None Follow up: TBD  Plan: transition to PO abx based on sensitivities. stable for discharge from surgical perspective with surgical follow up. Repeat Imaging and drain removal per IR.      LOS: 9 days    Ileana Roup , [facility] 07/16/2017, 7:53 AM Pager: [phone] Consults: [phone] Mon-Fri 7:00 am-4:30 pm Sat-Sun 7:00 am-11:30 am

## 2017-07-16 NOTE — Discharge Summary (Signed)
Physician Discharge Summary  [name] Coor XBM:[id number] DOB: February 22, 1935 DOA: 07/07/2017  PCP: Orlena Sheldon, PA-C  Admit date: 07/07/2017 Discharge date: 07/16/2017  Admitted From: Home Disposition: Home  Recommendations for Outpatient Follow-up:  1. Follow up with PCP in 1-2 weeks 2. Please obtain BMP/CBC in one week  Home Health yes Equipment/Devices JP drain Discharge Condition: Stable CODE STATUS full code Diet recommendation: Cardiac  Brief/Interim Summary:82 y.o.femalewith medical history significant ofhypertension, hyperlipidemia, COPD, GERD, depression, slightly, pulmonary hypertension,Takotsubosyndrome, atrial fibrillation on Coumadin, CAD, aortic root dilation,dCHF, colon cancer (s/p pfpartialresection and radiation therapy),breast cancer (s/p ofleft lumpectomy), who presents with abdominal pain.  Patient states that she has been having intermittent abdominal pain for about a six-month,which has worsened in the past 3 days. Her abdominal pain is located in mainly in the lower abdomen, but also involvingwhole abdomen, which is constant, 8 out of 10 in severity, sharp, nonradiating. It is not associated with nausea, vomiting. She has chronicdiarrhea,but not today. Denies fever or chills. Patient does not have chest pain, shortness breath, cough.She states that she has a little burning on urination, but denies dysuria or urgency.Pt was seen by PCP and hadCT scanwhichshowed diverticulitis with complex adnexal abscess. She has had a colonoscopy by Dr. Cristina Gong approximately 5 years ago.  ED Course:pt was found to haveWBC 13.3, lactic acid 1.36, negative urinalysis, electrolytes and renal function okay, temperature 99.1, no tachycardia, no tachypnea, oxygen saturation 95%onRA. Patient had drain placed by radiology under CT guidance to the left lower quadrant. She is complaining of pain at the site of the drain. She had bowel movements loose.   Culture grew  Citrobacter f SENSITIVE TO Rocephin.  Patient will be discharged home today on Vantin 400 mg twice a day for 2 weeks.  She will follow-up with interventional radiology in 1 week.  She will also follow-up with her gynecologist for this fluid collection in the uterus.     Discharge Diagnoses:  Principal Problem:   Pelvic abscess in female Active Problems:   GERD (gastroesophageal reflux disease)   Hypertension   COPD (chronic obstructive pulmonary disease) (HCC)   Coronary artery disease   Permanent atrial fibrillation (HCC)   Chronic anticoagulation   Chronic diastolic CHF (congestive heart failure) (HCC)  Pelvic abscess/diverticulitis status post drain placement 07/12/2017 by IR.culture grew citrobacetr feundii sensitive to rocephin.will dc zosyn and start rocephin.had 15 cc output from the tube overnight.  Patient will be discharged home today with a drain in place and follow-up with interventional radiology.  She will be on Vantin 400 mg twice a day for 2 weeks.  Chronic atrial fibrillation-coumadin restarted.Continue metoprolol.  Chronic diastolic CHF stable on torsemide and spironolactone continue metoprolol.     Discharge Instructions  Discharge Instructions    Discharge instructions   Complete by:  As directed    Keep drain site clean and dry.  Flush drain daily with 5 mL sterile saline.  Record output daily, and bring log to outpatient IR drain clinic appointment.  Schedulers will contact you with date and time of appointment.     Allergies as of 07/16/2017      Reactions   Contrast Media [iodinated Diagnostic Agents] Other (See Comments), Hives   Per pt strong burning sensation starting in chest radiating outward Per pt strong burning sensation starting in chest radiating outward   Clonidine Derivatives Other (See Comments)   Throat dry   Statins Other (See Comments)   "bones hurt"   Sulfa Antibiotics Diarrhea  Tremors    Celebrex [celecoxib] Rash    Isosorbide Nitrate Itching, Rash   Other Itching, Rash   Plastic and paper tape and heart monitor pads   Tape Itching, Rash   Red Where applied and will spread      Medication List    STOP taking these medications   guaiFENesin 600 MG 12 hr tablet Commonly known as:  MUCINEX     TAKE these medications   acetaminophen 325 MG tablet Commonly known as:  TYLENOL Take 650 mg by mouth every 6 (six) hours as needed for mild pain. Reported on 03/15/2016   amLODipine 5 MG tablet Commonly known as:  NORVASC Take 1 tablet (5 mg total) by mouth daily.   cefpodoxime 200 MG tablet Commonly known as:  VANTIN Take 2 tablets (400 mg total) 2 (two) times daily by mouth.   cetirizine 10 MG tablet Commonly known as:  ZYRTEC Take 10 mg by mouth daily as needed for allergies.   clonazePAM 0.5 MG tablet Commonly known as:  KLONOPIN Take 1 tablet (0.5 mg total) by mouth 2 (two) times daily as needed for anxiety.   cloNIDine 0.1 MG tablet Commonly known as:  CATAPRES TAKE ONE TABLET BY MOUTH TWICE DAILY   HYDROcodone-acetaminophen 7.5-325 MG tablet Commonly known as:  NORCO Take 1 tablet by mouth every 6 (six) hours as needed for moderate pain.   losartan 100 MG tablet Commonly known as:  COZAAR Take 1 tablet (100 mg total) by mouth daily.   metoprolol succinate 50 MG 24 hr tablet Commonly known as:  TOPROL-XL TAKE ONE TABLET BY MOUTH ONCE DAILY TAKE  WITH  OR  FOLLOWING  A  MEAL   mirtazapine 30 MG tablet Commonly known as:  REMERON Take 1 tablet (30 mg total) by mouth at bedtime.   montelukast 10 MG tablet Commonly known as:  SINGULAIR Take 1 tablet (10 mg total) by mouth at bedtime. What changed:    when to take this  reasons to take this   nitroGLYCERIN 0.4 MG SL tablet Commonly known as:  NITROSTAT Place 1 tablet (0.4 mg total) under the tongue every 5 (five) minutes as needed for chest pain (x 3 pills).   omeprazole 20 MG capsule Commonly known as:  PRILOSEC Take 1  capsule (20 mg total) by mouth daily as needed (heartburn, acid reflux).   OVER THE COUNTER MEDICATION Place 1 drop into both eyes daily as needed (dry eyes). Eye drops   spironolactone 25 MG tablet Commonly known as:  ALDACTONE TAKE ONE-HALF TABLET BY MOUTH ONCE DAILY   torsemide 20 MG tablet Commonly known as:  DEMADEX Take 60 mg by mouth daily.   triamcinolone cream 0.1 % Commonly known as:  KENALOG Apply 1 application topically 2 (two) times daily.   warfarin 1 MG tablet Commonly known as:  COUMADIN Take 0.5 tablets (0.5 mg total) by mouth as directed. M-W-F-SA What changed:    how much to take  when to take this  additional instructions   warfarin 5 MG tablet Commonly known as:  COUMADIN Take 0.5 tablets (2.5 mg total) by mouth See admin instructions. Take half a tablet (2.5 mg) on Tuesdays, Thursdays, and Sundays. Take half a tablet with half a tablet of 1 mg (total 3 mg) on all other days. What changed:  additional instructions      Follow-up [place] for [facility]. Schedule an appointment as soon as possible for a visit.  Specialty:  Obstetrics and Gynecology Why:  for evaluation for uterine fluid collection Contact information: [address] [place]       Jovita Kussmaul, MD Follow up.   Specialty:  General Surgery Why:  call as soon as possible and schedule a follow up appointment in 2 weeks. Contact information: [address] [place] [address] [phone]        Arne Cleveland, MD Follow up in 1 week(s).   Specialties:  Interventional Radiology, Radiology Why:  Schedulers will call with date and time of appointment.  Contact information: [address] [place] [address] [phone]          Allergies  Allergen Reactions  . Contrast Media [Iodinated Diagnostic Agents] Other (See Comments) and Hives    Per pt strong  burning sensation starting in chest radiating outward Per pt strong burning sensation starting in chest radiating outward  . Clonidine Derivatives Other (See Comments)    Throat dry  . Statins Other (See Comments)    "bones hurt"  . Sulfa Antibiotics Diarrhea    Tremors   . Celebrex [Celecoxib] Rash  . Isosorbide Nitrate Itching and Rash  . Other Itching and Rash    Plastic and paper tape and heart monitor pads  . Tape Itching and Rash    Red Where applied and will spread    Consultations:  Surgery, interventional radiology   Procedures/Studies: Ct Abdomen Pelvis Wo Contrast  Result Date: 07/11/2017 CLINICAL DATA:  History of left pelvic abscess, previously not amenable to percutaneous drainage. Evaluate for interval enlargement. EXAM: CT ABDOMEN AND PELVIS WITHOUT CONTRAST TECHNIQUE: Multidetector CT imaging of the abdomen and pelvis was performed following the standard protocol without IV contrast. COMPARISON:  CT abdomen pelvis - 07/06/2017 ; 04/27/2017; 03/24/2017 FINDINGS: The lack of intravenous contrast limits the ability to evaluate solid abdominal organs. Lower chest: Limited visualization of the lower thorax demonstrates minimal dependent subsegmental atelectasis within the medial basilar segment of the right lower lobe. No focal airspace opacities. No pleural effusion. Cardiomegaly. No pericardial effusion. Calcifications within the mitral valve annulus. Hepatobiliary: There is mild nodularity hepatic contour suggestive of cirrhotic change. There is a minimal amount of focal fatty infiltration adjacent to the fissure filling and thin teres. Post cholecystectomy. No ascites. Pancreas: Normal noncontrast appearance of the pancreas Spleen: Multiple punctate granuloma are seen within an otherwise normal-appearing spleen. Adrenals/Urinary Tract: Normal noncontrast appearance the bilateral kidneys. No renal stones. No renal stones are seen along expected course of either ureter or the  urinary bladder. No evidence of urinary obstruction. Normal noncontrast appearance the bilateral adrenal glands. Normal appearance of the urinary bladder given degree distention. Stomach/Bowel: Re- demonstrated air and fluid containing structure centered within the left adnexa with dominant component measuring approximately 5.2 x 3.3 cm, previously, 5.4 x 3.2 cm. This structure is again associated with adjacent colonic diverticulosis and minimal amount of pericolonic stranding. Re- demonstrated fluid seen within the endometrial canal with dominant component measuring approximately 3.3 x 2.5 cm. No new or enlarging abdominal or pelvic fluid collections. Stable sequela of prior right hemicolectomy. No evidence of enteric obstruction. No pneumoperitoneum, pneumatosis or portal venous gas. Vascular/Lymphatic: Atherosclerotic plaque with a normal caliber abdominal aorta. Scattered retroperitoneal and mesenteric lymph nodes are numerous though individually not enlarged by size criteria with index aortocaval lymph node measuring 0.9 cm in greatest short axis diameter (image 35, series 3), presumably reactive. No enlarging bulky  retroperitoneal, mesenteric, pelvic or inguinal lymphadenopathy. Reproductive: As detailed in the stomach/bowel section above. Other: There is a minimal subcutaneous edema about the midline of the low back. Musculoskeletal: Moderate severe multilevel lumbar spine DDD, worse at L1-L2 and L5-S1 with disc space height loss, endplate irregularity and sclerosis. Stigmata of DISH with the caudal aspect of the thoracic spine. Degenerative change of the pubic symphysis. IMPRESSION: 1. Grossly unchanged size and appearance of approximately 5.2 cm left adnexal air and fluid collection, incompletely characterized on this noncontrast examination, though again favored to represent secondary involvement of the left adnexa due to adjacent diverticular disease. Given persistence, this structure Bero be amenable to  CT-guided percutaneous aspiration and/or drainage catheter placement as clinically indicated. 2. Fluid is again seen within the endometrial canal, an abnormal finding in this postmenopausal patient and given suspected infectious process within the left adnexa, concomitant infection of the endometrium is not excluded on the basis this examination. Correlation for vaginal discharge is recommended. Further evaluation with pelvic examination could be performed as indicated. 3. Colonic diverticulosis without evidence of worsening diverticulitis. No new or enlarging fluid collections within the abdomen or pelvis. 4. Post right hemicolectomy without evidence of enteric obstruction. 5. Nodularity hepatic contour suggestive of early cirrhotic change. Correlation LFTs is recommended. 6. Sequela of prior granulomatous disease as above. 7.  Aortic Atherosclerosis (ICD10-I70.0). Electronically Signed   By: Sandi Mariscal M.D.   On: 07/11/2017 17:02   Ct Abdomen Pelvis Wo Contrast  Result Date: 07/06/2017 CLINICAL DATA:  Left lower quadrant abdominal pain for 3 days. Low-grade fever. EXAM: CT ABDOMEN AND PELVIS WITHOUT CONTRAST TECHNIQUE: Multidetector CT imaging of the abdomen and pelvis was performed following the standard protocol without IV contrast. COMPARISON:  CT scan 04/27/2017 FINDINGS: Lower chest: The lung bases are clear of acute process. No worrisome pulmonary lesions. The heart is normal in size for age. No pericardial effusion. Hepatobiliary: No worrisome hepatic lesions. Small calcified granulomas are noted. The gallbladder is surgically absent. No common bile duct dilatation. Pancreas: Diffuse fatty change involving the pancreas but no acute inflammation, mass or ductal dilatation. Spleen: Normal size.  Numerous calcified granulomas. Adrenals/Urinary Tract: The adrenal glands and kidneys are unremarkable. No renal, ureteral or bladder calculi or mass. Stomach/Bowel: The stomach, duodenum and small bowel are  grossly normal. No acute inflammatory process, mass lesions or obstructive findings. Prior history of right colectomy with ileal colonic anastomosis at the transverse colon. No complicating features. There is mild to moderate inflammation involving the sigmoid colon and significant diverticulosis. Adjacent to the sigmoid colon and closely associated with the patient's left ovary is a 4.9 x 4.4 cm abscess. There is also a thickened fluid filled appearing endometrium which is new since August. Findings most likely due to complicated diverticulitis with abscess involving left ovary and extending into the left fallopian tube and into the endometrial cavity. A small amount of air is also noted in the vagina. I suspect the patient has a vaginal discharge. Vascular/Lymphatic: Small scattered retroperitoneal lymph nodes and a stable peripancreatic node on image number 32. No mesenteric or retroperitoneal mass. Moderate atherosclerotic calcifications involving the aorta and branch vessels. Reproductive: Uterus and ovaries as discussed above. The right ovary appears normal. Other: No free pelvic fluid.  No inguinal mass or adenopathy. Musculoskeletal: No significant bony findings. Osteoporosis is noted. IMPRESSION: 1. 4.9 x 4.4 cm left pelvic abscess with the epicenter in the region of the left adnexum. I suspect this is complicated diverticulitis with an abscess involving  the left ovary and likely extending into the endometrial cavity. 2. Sigmoid diverticulosis. 3. Remote surgical changes involving the colon without complicating features. These results will be called to the ordering clinician or representative by the Radiologist Assistant, and communication documented in the PACS or zVision Dashboard. Electronically Signed   By: Marijo Sanes M.D.   On: 07/06/2017 16:52   Ct Image Guided Drainage By Percutaneous Catheter  Result Date: 07/12/2017 CLINICAL DATA:  Persistent left pelvic diverticular abscess. EXAM: CT GUIDED  DRAINAGE OF LEFT PELVIC ABSCESS ANESTHESIA/SEDATION: Intravenous Fentanyl and Versed were administered as conscious sedation during continuous monitoring of the patient's level of consciousness and physiological / cardiorespiratory status by the radiology RN, with a total moderate sedation time of 12 minutes. PROCEDURE: The procedure, risks, benefits, and alternatives were explained to the patient. Questions regarding the procedure were encouraged and answered. The patient understands and consents to the procedure. Select axial scans through the pelvis obtained. The left collection was localized, corresponding to that seen on prior CT, and an appropriate skin entry site determined and marked. The operative field was prepped with chlorhexidinein a sterile fashion, and a sterile drape was applied covering the operative field. A sterile gown and sterile gloves were used for the procedure. Local anesthesia was provided with 1% Lidocaine. Under CT fluoroscopic guidance, 18 gauge trocar needle advanced to the collection. Purulent material could be aspirated. Amplatz guidewire advanced easily within the collection, position confirmed on CT. Tract dilated to facilitate placement of a 10 French pigtail catheter, formed centrally within the collection. 5 mL of greenish purulent aspirate were sent for Gram stain and culture. Catheter secured externally with 0 Prolene suture, flushed, and placed to gravity drain bag. The patient tolerated the procedure well. COMPLICATIONS: None immediate FINDINGS: Loculated gas and fluid collection in the left pelvis again localized. Percutaneous drain catheter placed as above. Sample of the aspirate sent for Gram stain and culture. IMPRESSION: 1. Technically successful CT-guided left pelvic abscess drain catheter placement. Electronically Signed   By: Lucrezia Europe M.D.   On: 07/12/2017 10:31    (Echo, Carotid, EGD, Colonoscopy, ERCP)    Subjective:   Discharge Exam: Vitals:   07/15/17  2057 07/16/17 0500  BP: (!) 147/54 (!) 160/61  Pulse: (!) 57 (!) 57  Resp: 18 18  Temp: 98.1 F (36.7 C) 97.8 F (36.6 C)  SpO2: 99% 96%   Vitals:   07/15/17 0515 07/15/17 1248 07/15/17 2057 07/16/17 0500  BP: (!) 178/66 (!) 159/64 (!) 147/54 (!) 160/61  Pulse: 66 68 (!) 57 (!) 57  Resp: 18 18 18 18   Temp: 98.3 F (36.8 C) (!) 97.5 F (36.4 C) 98.1 F (36.7 C) 97.8 F (36.6 C)  TempSrc: Oral Oral Oral Oral  SpO2: 96% 97% 99% 96%  Weight: 94.6 kg (208 lb 9.6 oz)   95.1 kg (209 lb 9.6 oz)  Height:        General: Pt is alert, awake, not in acute distress Cardiovascular: RRR, S1/S2 +, no rubs, no gallops Respiratory: CTA bilaterally, no wheezing, no rhonchi Abdominal: Soft, NT, ND, bowel sounds + Extremities: no edema, no cyanosis    The results of significant diagnostics from this hospitalization (including imaging, microbiology, ancillary and laboratory) are listed below for reference.     Microbiology: Recent Results (from the past 240 hour(s))  Culture, blood (Routine X 2) w Reflex to ID Panel     Status: None   Collection Time: 07/07/17  2:45 AM  Result Value  Ref Range Status   Specimen Description BLOOD LEFT FOREARM  Final   Special Requests   Final    BOTTLES DRAWN AEROBIC AND ANAEROBIC Blood Culture adequate volume   Culture NO GROWTH 5 DAYS  Final   Report Status 07/12/2017 FINAL  Final  Culture, blood (Routine X 2) w Reflex to ID Panel     Status: None   Collection Time: 07/07/17  2:54 AM  Result Value Ref Range Status   Specimen Description BLOOD RIGHT HAND  Final   Special Requests   Final    BOTTLES DRAWN AEROBIC AND ANAEROBIC Blood Culture adequate volume   Culture NO GROWTH 5 DAYS  Final   Report Status 07/12/2017 FINAL  Final  Aerobic/Anaerobic Culture (surgical/deep wound)     Status: None (Preliminary result)   Collection Time: 07/12/17 10:20 AM  Result Value Ref Range Status   Specimen Description ABSCESS  Final   Special Requests CT LLQ DRAIN   Final   Gram Stain   Final    ABUNDANT WBC PRESENT, PREDOMINANTLY PMN NO ORGANISMS SEEN    Culture   Final    ABUNDANT CITROBACTER SPECIES CULTURE REINCUBATED FOR BETTER GROWTH HOLDING FOR POSSIBLE ANAEROBE    Report Status PENDING  Incomplete   Organism ID, Bacteria CITROBACTER SPECIES  Final      Susceptibility   Citrobacter species - MIC*    CEFAZOLIN >=64 RESISTANT Resistant     CEFEPIME <=1 SENSITIVE Sensitive     CEFTAZIDIME <=1 SENSITIVE Sensitive     CEFTRIAXONE <=1 SENSITIVE Sensitive     CIPROFLOXACIN <=0.25 SENSITIVE Sensitive     GENTAMICIN <=1 SENSITIVE Sensitive     IMIPENEM <=0.25 SENSITIVE Sensitive     TRIMETH/SULFA <=20 SENSITIVE Sensitive     PIP/TAZO <=4 SENSITIVE Sensitive     * ABUNDANT CITROBACTER SPECIES     Labs: BNP (last 3 results) Recent Labs    01/18/17 1206 03/11/17 1145 07/07/17 0244  BNP 176.0* 163.0* 245.8*   Basic Metabolic Panel: Recent Labs  Lab 07/10/17 0336 07/11/17 0359 07/14/17 0446  NA 138 137 137  K 3.4* 3.1* 3.1*  CL 97* 96* 95*  CO2 31 30 33*  GLUCOSE 94 94 104*  BUN 11 11 10   CREATININE 1.05* 1.00 1.09*  CALCIUM 9.0 8.9 9.1   Liver Function Tests: No results for input(s): AST, ALT, ALKPHOS, BILITOT, PROT, ALBUMIN in the last 168 hours. No results for input(s): LIPASE, AMYLASE in the last 168 hours. No results for input(s): AMMONIA in the last 168 hours. CBC: Recent Labs  Lab 07/11/17 0359 07/13/17 0430 07/14/17 0446  WBC 7.7 7.2 6.8  NEUTROABS  --   --  3.5  HGB 9.9* 10.9* 10.6*  HCT 31.1* 34.2* 33.6*  MCV 92.0 91.4 92.6  PLT 254 319 339   Cardiac Enzymes: No results for input(s): CKTOTAL, CKMB, CKMBINDEX, TROPONINI in the last 168 hours. BNP: Invalid input(s): POCBNP CBG: Recent Labs  Lab 07/12/17 0609 07/13/17 0553 07/14/17 0631 07/15/17 0603 07/16/17 0701  GLUCAP 89 95 95 107* 103*   D-Dimer No results for input(s): DDIMER in the last 72 hours. Hgb A1c No results for input(s): HGBA1C  in the last 72 hours. Lipid Profile No results for input(s): CHOL, HDL, LDLCALC, TRIG, CHOLHDL, LDLDIRECT in the last 72 hours. Thyroid function studies No results for input(s): TSH, T4TOTAL, T3FREE, THYROIDAB in the last 72 hours.  Invalid input(s): FREET3 Anemia work up No results for input(s): VITAMINB12, FOLATE, FERRITIN, TIBC, IRON, RETICCTPCT  in the last 72 hours. Urinalysis    Component Value Date/Time   COLORURINE YELLOW 07/07/2017 0641   APPEARANCEUR CLEAR 07/07/2017 0641   LABSPEC 1.009 07/07/2017 0641   PHURINE 5.0 07/07/2017 0641   GLUCOSEU NEGATIVE 07/07/2017 0641   HGBUR MODERATE (A) 07/07/2017 0641   BILIRUBINUR NEGATIVE 07/07/2017 0641   KETONESUR NEGATIVE 07/07/2017 0641   PROTEINUR 30 (A) 07/07/2017 0641   UROBILINOGEN 0.2 11/04/2013 1321   NITRITE NEGATIVE 07/07/2017 0641   LEUKOCYTESUR NEGATIVE 07/07/2017 0641   Sepsis Labs Invalid input(s): PROCALCITONIN,  WBC,  LACTICIDVEN Microbiology Recent Results (from the past 240 hour(s))  Culture, blood (Routine X 2) w Reflex to ID Panel     Status: None   Collection Time: 07/07/17  2:45 AM  Result Value Ref Range Status   Specimen Description BLOOD LEFT FOREARM  Final   Special Requests   Final    BOTTLES DRAWN AEROBIC AND ANAEROBIC Blood Culture adequate volume   Culture NO GROWTH 5 DAYS  Final   Report Status 07/12/2017 FINAL  Final  Culture, blood (Routine X 2) w Reflex to ID Panel     Status: None   Collection Time: 07/07/17  2:54 AM  Result Value Ref Range Status   Specimen Description BLOOD RIGHT HAND  Final   Special Requests   Final    BOTTLES DRAWN AEROBIC AND ANAEROBIC Blood Culture adequate volume   Culture NO GROWTH 5 DAYS  Final   Report Status 07/12/2017 FINAL  Final  Aerobic/Anaerobic Culture (surgical/deep wound)     Status: None (Preliminary result)   Collection Time: 07/12/17 10:20 AM  Result Value Ref Range Status   Specimen Description ABSCESS  Final   Special Requests CT LLQ DRAIN   Final   Gram Stain   Final    ABUNDANT WBC PRESENT, PREDOMINANTLY PMN NO ORGANISMS SEEN    Culture   Final    ABUNDANT CITROBACTER SPECIES CULTURE REINCUBATED FOR BETTER GROWTH HOLDING FOR POSSIBLE ANAEROBE    Report Status PENDING  Incomplete   Organism ID, Bacteria CITROBACTER SPECIES  Final      Susceptibility   Citrobacter species - MIC*    CEFAZOLIN >=64 RESISTANT Resistant     CEFEPIME <=1 SENSITIVE Sensitive     CEFTAZIDIME <=1 SENSITIVE Sensitive     CEFTRIAXONE <=1 SENSITIVE Sensitive     CIPROFLOXACIN <=0.25 SENSITIVE Sensitive     GENTAMICIN <=1 SENSITIVE Sensitive     IMIPENEM <=0.25 SENSITIVE Sensitive     TRIMETH/SULFA <=20 SENSITIVE Sensitive     PIP/TAZO <=4 SENSITIVE Sensitive     * ABUNDANT CITROBACTER SPECIES     Time coordinating discharge: Over 30 minutes  SIGNED:   Georgette Shell, MD  [facility] 07/16/2017, 10:29 AM  If 7PM-7AM, please contact night-coverage [email or web address] Password [email or web address]

## 2017-07-17 DIAGNOSIS — I110 Hypertensive heart disease with heart failure: Secondary | ICD-10-CM | POA: Diagnosis not present

## 2017-07-17 DIAGNOSIS — I5030 Unspecified diastolic (congestive) heart failure: Secondary | ICD-10-CM | POA: Diagnosis not present

## 2017-07-17 DIAGNOSIS — K5720 Diverticulitis of large intestine with perforation and abscess without bleeding: Secondary | ICD-10-CM | POA: Diagnosis not present

## 2017-07-17 DIAGNOSIS — J449 Chronic obstructive pulmonary disease, unspecified: Secondary | ICD-10-CM | POA: Diagnosis not present

## 2017-07-17 DIAGNOSIS — I2510 Atherosclerotic heart disease of native coronary artery without angina pectoris: Secondary | ICD-10-CM | POA: Diagnosis not present

## 2017-07-17 DIAGNOSIS — B9689 Other specified bacterial agents as the cause of diseases classified elsewhere: Secondary | ICD-10-CM | POA: Diagnosis not present

## 2017-07-17 LAB — AEROBIC/ANAEROBIC CULTURE (SURGICAL/DEEP WOUND)

## 2017-07-17 LAB — AEROBIC/ANAEROBIC CULTURE W GRAM STAIN (SURGICAL/DEEP WOUND)

## 2017-07-18 ENCOUNTER — Other Ambulatory Visit: Payer: Self-pay | Admitting: General Surgery

## 2017-07-18 DIAGNOSIS — K651 Peritoneal abscess: Secondary | ICD-10-CM

## 2017-07-19 ENCOUNTER — Encounter: Payer: Self-pay | Admitting: Obstetrics and Gynecology

## 2017-07-19 ENCOUNTER — Ambulatory Visit (INDEPENDENT_AMBULATORY_CARE_PROVIDER_SITE_OTHER): Payer: Medicare Other | Admitting: Obstetrics and Gynecology

## 2017-07-19 VITALS — BP 157/73 | HR 67 | Ht 66.0 in | Wt 212.0 lb

## 2017-07-19 DIAGNOSIS — N949 Unspecified condition associated with female genital organs and menstrual cycle: Secondary | ICD-10-CM | POA: Diagnosis not present

## 2017-07-20 ENCOUNTER — Telehealth: Payer: Self-pay | Admitting: *Deleted

## 2017-07-20 ENCOUNTER — Encounter: Payer: Self-pay | Admitting: Obstetrics and Gynecology

## 2017-07-20 DIAGNOSIS — R971 Elevated cancer antigen 125 [CA 125]: Secondary | ICD-10-CM

## 2017-07-20 LAB — CA 125: CANCER ANTIGEN (CA) 125: 97 U/mL — AB (ref 0.0–38.1)

## 2017-07-20 NOTE — Progress Notes (Signed)
Obstetrics and Gynecology Consult Patient Evaluation  Appointment Date: 07/19/2017  OBGYN Clinic: Center for [facility]  Primary Care Provider: Orlena Sheldon ([place])  Referring Provider: Orlena Sheldon, PA-C  Chief Complaint: abnormal CT scan  History of Present Illness: [name] is a 82 y.o. Caucasian G2P1011 (LMP: approx in her 69s), seen for the above chief complaint. Her past medical history is significant for multiple episodes of diverticulitis, right sided colectomy for CRC, breast cancer and CHF  She was most recently admitted to [facility] from 11/1-11/10 for diverticulitis with adnexal abscess. 11/5 CT scan showed stable 5cm left adnexal air-fluid structure and fluid again seen in the endometrial canal. She had a JP drain placed. 11/6 cultures showed abundant WBC, predominantly PMNs on gram stain and culture showed citrobacter, rare bacteroides and beta-lactamase +  She states that she feels better and denies any fevers, abdominal pain, nausea, vomiting. She also has never had any postmenopausal bleeding or spotting.   Review of Systems: as noted in the History of Present Illness.  Patient Active Problem List   Diagnosis Date Noted  . Pelvic abscess in female 07/07/2017  . Aortic stenosis   . Dyslipidemia, goal LDL below 70 11/28/2016  . Monitoring for long-term anticoagulant use 12/31/2015  . Chronic diastolic CHF (congestive heart failure) ([place]) 10/28/2015  . Insomnia 10/23/2015  . Leg swelling 08/11/2015  . Takotsubo syndrome 07/29/2015  . NSTEMI (non-ST elevated myocardial infarction) ([place]) 07/28/2015  . SOB (shortness of breath) 08/15/2014  . Dilated aortic root ([place])   . Pulmonary HTN ([place])   . Permanent atrial fibrillation ([place]) 07/04/2013  . Chronic anticoagulation 07/04/2013  . Coronary artery disease   . Lesion of nipple 05/15/2013  . GERD (gastroesophageal reflux disease)   . Hypertension   . COPD (chronic obstructive  pulmonary disease) ([place])   . Osteopenia   . Diverticulosis   . Hiatal hernia   . Abdominal pain 01/11/2012  . Breast cancer of upper-outer quadrant of left female breast ([place]) 01/06/2012  . Colon cancer ([place]) 01/06/2012   Past Medical History:  Past Medical History:  Diagnosis Date  . Allergy    rhinitis  . Aortic stenosis    mild by echo 06/2017  . Arthritis   . Breast cancer ([place]) 01/06/2012  . Cancer [facility])    right colon and left breast  . Chronic diastolic CHF (congestive heart failure) ([place])   . Colon cancer ([place]) 01/06/2012  . COPD (chronic obstructive pulmonary disease) ([place])   . Coronary artery disease 2006   nonobstructive with 20-30% ostial LAD and LM  . Dilated aortic root ([place])    1mmHg by echo 06/2017  . Diverticulosis   . Edema extremities   . GERD (gastroesophageal reflux disease)   . Hernia   . Hiatal hernia    denies  . Hyperlipidemia   . Hypertension   . Mild aortic stenosis    echo 11/2015 but not noted on echo 06/2016  . Osteopenia   . Permanent atrial fibrillation (HCC)    chronic atrial fibrillation  . Pneumonia    hx child  . Pulmonary HTN ([place])    moderate to severe PASP 48mmHg echo 11/2015 - now 61mmHg by echo 06/2017  . Takotsubo syndrome 07/29/2015   a. EF 35-40% by echo; akinesis of mid-apical anteroseptal and apical myocardium.  EF now normalized on echo 11/2015    Past Surgical History:  Past Surgical History:  Procedure Laterality Date  .  APPENDECTOMY    . BREAST SURGERY     lumpectomy left  . CARDIAC CATHETERIZATION    . CHOLECYSTECTOMY    . COLECTOMY     right side  . EYE SURGERY Bilateral 12   cataracts  . MASTECTOMY PARTIAL / LUMPECTOMY  2008   left breast  . SPLIT NIGHT STUDY  02/02/2016        Past Obstetrical History:  OB History  Gravida Para Term Preterm AB Living  2 1 1   1 1   SAB TAB Ectopic Multiple Live Births  1       1    # Outcome Date GA Lbr Len/2nd Weight Sex Delivery Anes PTL Lv  2 Term      Vag-Spont    LIV  1 SAB      SAB         Past Gynecological History: As per HPI. History of Pap Smear(s): Yes.   Last pap unsure but they have always been normal.   History of HRT use: No.  Social History:  Social History   Socioeconomic History  . Marital status: Married    Spouse name: Not on file  . Number of children: Not on file  . Years of education: Not on file  . Highest education level: Not on file  Social Needs  . Financial resource strain: Not on file  . Food insecurity - worry: Not on file  . Food insecurity - inability: Not on file  . Transportation needs - medical: Not on file  . Transportation needs - non-medical: Not on file  Occupational History  . Not on file  Tobacco Use  . Smoking status: Never Smoker  . Smokeless tobacco: Never Used  Substance and Sexual Activity  . Alcohol use: No  . Drug use: No  . Sexual activity: Not Currently  Other Topics Concern  . Not on file  Social History Narrative  . Not on file    Family History:  Family History  Problem Relation Age of Onset  . Heart disease Mother   . Heart attack Mother   . Cancer Sister        stomach and colon  . Heart disease Brother 79  . Hypertension Father   . Cancer Sister   . Stroke Neg Hx    She denies any female cancers, bleeding or blood clotting disorders.    Medications [name]. Vasco had no medications administered during this visit. Current Outpatient Medications  Medication Sig Dispense Refill  . acetaminophen (TYLENOL) 325 MG tablet Take 650 mg by mouth every 6 (six) hours as needed for mild pain. Reported on 03/15/2016    . amLODipine (NORVASC) 5 MG tablet Take 1 tablet (5 mg total) by mouth daily. 30 tablet 3  . cefpodoxime (VANTIN) 200 MG tablet Take 2 tablets (400 mg total) 2 (two) times daily by mouth. 28 tablet 0  . cetirizine (ZYRTEC) 10 MG tablet Take 10 mg by mouth daily as needed for allergies.    . clonazePAM (KLONOPIN) 0.5 MG tablet Take 1 tablet (0.5 mg total) by mouth 2  (two) times daily as needed for anxiety. 60 tablet 1  . cloNIDine (CATAPRES) 0.1 MG tablet TAKE ONE TABLET BY MOUTH TWICE DAILY 60 tablet 11  . HYDROcodone-acetaminophen (NORCO) 7.5-325 MG tablet Take 1 tablet every 6 (six) hours as needed by mouth for severe pain. 30 tablet 0  . losartan (COZAAR) 100 MG tablet Take 1 tablet (100 mg total) by mouth  daily. 30 tablet 3  . metoprolol succinate (TOPROL-XL) 50 MG 24 hr tablet TAKE ONE TABLET BY MOUTH ONCE DAILY TAKE  WITH  OR  FOLLOWING  A  MEAL 90 tablet 3  . mirtazapine (REMERON) 30 MG tablet Take 1 tablet (30 mg total) by mouth at bedtime. 30 tablet 1  . montelukast (SINGULAIR) 10 MG tablet Take 1 tablet (10 mg total) by mouth at bedtime. (Patient taking differently: Take 10 mg by mouth at bedtime as needed (for allergies). ) 30 tablet 3  . nitroGLYCERIN (NITROSTAT) 0.4 MG SL tablet Place 1 tablet (0.4 mg total) under the tongue every 5 (five) minutes as needed for chest pain (x 3 pills). 30 tablet 2  . omeprazole (PRILOSEC) 20 MG capsule Take 1 capsule (20 mg total) by mouth daily as needed (heartburn, acid reflux). 90 capsule 3  . OVER THE COUNTER MEDICATION Place 1 drop into both eyes daily as needed (dry eyes). Eye drops    . spironolactone (ALDACTONE) 25 MG tablet TAKE ONE-HALF TABLET BY MOUTH ONCE DAILY 45 tablet 3  . torsemide (DEMADEX) 20 MG tablet Take 60 mg by mouth daily.     Marland Kitchen triamcinolone cream (KENALOG) 0.1 % Apply 1 application topically 2 (two) times daily. 30 g 0  . warfarin (COUMADIN) 1 MG tablet Take 0.5 tablets (0.5 mg total) by mouth as directed. M-W-F-SA (Patient taking differently: Take 3 mg by mouth See admin instructions. Mon/Wed/Fri/Sat) 90 tablet 2  . warfarin (COUMADIN) 5 MG tablet Take 0.5 tablets (2.5 mg total) by mouth See admin instructions. Take half a tablet (2.5 mg) on Tuesdays, Thursdays, and Sundays. Take half a tablet with half a tablet of 1 mg (total 3 mg) on all other days. (Patient taking differently: Take 2.5  mg by mouth See admin instructions. Take 1/2 tablet  on Tuesdays, Thursdays, and Sundays.) 90 tablet 2   No current facility-administered medications for this visit.     Allergies Contrast media [iodinated diagnostic agents]; Clonidine derivatives; Statins; Sulfa antibiotics; Celebrex [celecoxib]; Isosorbide nitrate; Other; and Tape   Physical Exam:  BP (!) 157/73   Pulse 67   Ht 5\' 6"  (1.676 m)   Wt 212 lb (96.2 kg)   BMI 34.22 kg/m  Body mass index is 34.22 kg/m. General appearance: Well nourished, well developed female in no acute distress.  Respiratory:  Normal respiratory effort Abdomen: soft, nttp, nd, JP drain in place with minimal clear-light yellow fluid in bag Neuro/Psych:  Normal mood and affect.  Skin:  Warm and dry.   Patient declines pelvic exam  Laboratory: as above  Radiology:  CLINICAL DATA:  History of left pelvic abscess, previously not amenable to percutaneous drainage. Evaluate for interval enlargement.  EXAM: CT ABDOMEN AND PELVIS WITHOUT CONTRAST  TECHNIQUE: Multidetector CT imaging of the abdomen and pelvis was performed following the standard protocol without IV contrast.  COMPARISON:  CT abdomen pelvis - 07/06/2017 ; 04/27/2017; 03/24/2017  FINDINGS: The lack of intravenous contrast limits the ability to evaluate solid abdominal organs.  Lower chest: Limited visualization of the lower thorax demonstrates minimal dependent subsegmental atelectasis within the medial basilar segment of the right lower lobe. No focal airspace opacities. No pleural effusion.  Cardiomegaly. No pericardial effusion. Calcifications within the mitral valve annulus.  Hepatobiliary: There is mild nodularity hepatic contour suggestive of cirrhotic change. There is a minimal amount of focal fatty infiltration adjacent to the fissure filling and thin teres. Post cholecystectomy. No ascites.  Pancreas: Normal noncontrast appearance of  the  pancreas  Spleen: Multiple punctate granuloma are seen within an otherwise normal-appearing spleen.  Adrenals/Urinary Tract: Normal noncontrast appearance the bilateral kidneys. No renal stones. No renal stones are seen along expected course of either ureter or the urinary bladder. No evidence of urinary obstruction.  Normal noncontrast appearance the bilateral adrenal glands.  Normal appearance of the urinary bladder given degree distention.  Stomach/Bowel: Re- demonstrated air and fluid containing structure centered within the left adnexa with dominant component measuring approximately 5.2 x 3.3 cm, previously, 5.4 x 3.2 cm. This structure is again associated with adjacent colonic diverticulosis and minimal amount of pericolonic stranding.  Re- demonstrated fluid seen within the endometrial canal with dominant component measuring approximately 3.3 x 2.5 cm.  No new or enlarging abdominal or pelvic fluid collections.  Stable sequela of prior right hemicolectomy. No evidence of enteric obstruction. No pneumoperitoneum, pneumatosis or portal venous gas.  Vascular/Lymphatic: Atherosclerotic plaque with a normal caliber abdominal aorta.  Scattered retroperitoneal and mesenteric lymph nodes are numerous though individually not enlarged by size criteria with index aortocaval lymph node measuring 0.9 cm in greatest short axis diameter (image 35, series 3), presumably reactive. No enlarging bulky retroperitoneal, mesenteric, pelvic or inguinal lymphadenopathy.  Reproductive: As detailed in the stomach/bowel section above.  Other: There is a minimal subcutaneous edema about the midline of the low back.  Musculoskeletal: Moderate severe multilevel lumbar spine DDD, worse at L1-L2 and L5-S1 with disc space height loss, endplate irregularity and sclerosis. Stigmata of DISH with the caudal aspect of the thoracic spine. Degenerative change of the pubic  symphysis.  IMPRESSION: 1. Grossly unchanged size and appearance of approximately 5.2 cm left adnexal air and fluid collection, incompletely characterized on this noncontrast examination, though again favored to represent secondary involvement of the left adnexa due to adjacent diverticular disease. Given persistence, this structure [place] be amenable to CT-guided percutaneous aspiration and/or drainage catheter placement as clinically indicated. 2. Fluid is again seen within the endometrial canal, an abnormal finding in this postmenopausal patient and given suspected infectious process within the left adnexa, concomitant infection of the endometrium is not excluded on the basis this examination. Correlation for vaginal discharge is recommended. Further evaluation with pelvic examination could be performed as indicated. 3. Colonic diverticulosis without evidence of worsening diverticulitis. No new or enlarging fluid collections within the abdomen or pelvis. 4. Post right hemicolectomy without evidence of enteric obstruction. 5. Nodularity hepatic contour suggestive of early cirrhotic change. Correlation LFTs is recommended. 6. Sequela of prior granulomatous disease as above. 7.  Aortic Atherosclerosis (ICD10-I70.0).   Electronically Signed   By: Sandi Mariscal M.D.   On: 07/11/2017 17:02  Assessment: pt stable  Plan:  1. Adnexal cyst Pt declines pelvic exam today due to still having her drain in place which is due to come out next week so she'd like to wait until it's removed before doing a pelvic. Recommend a pelvic u/s in 4-6wks to give time for the inflammation and acute GI process to die down, but I told her that my suspicion is low for a GYN malignancy process; will get a CA 125 today but possibility that it Jawad be falsely + due to recent infectious process. Will bring patient back in a few weeks to do pelvic exam.  - US Transvaginal Non-OB; Future - CA 125 - US PELVIC  COMPLETE WITH TRANSVAGINAL; Future  Orders Placed This Encounter  Procedures  . US Transvaginal Non-OB  . US PELVIC COMPLETE WITH TRANSVAGINAL  .  CA 125    RTC 2wks  Durene Romans MD Attending Center for [employer])

## 2017-07-20 NOTE — Addendum Note (Signed)
Addended by: Gretchen Short on: 07/20/2017 09:41 AM   Modules accepted: Orders

## 2017-07-20 NOTE — Telephone Encounter (Signed)
Called pt to go over CA125 results and to advise Gyn/Onc will be calling to set appt. Pt expressed understanding.

## 2017-07-21 ENCOUNTER — Ambulatory Visit: Payer: Medicare Other | Admitting: Physician Assistant

## 2017-07-21 ENCOUNTER — Other Ambulatory Visit: Payer: Self-pay

## 2017-07-21 ENCOUNTER — Other Ambulatory Visit (HOSPITAL_COMMUNITY): Payer: Self-pay | Admitting: Adult Health

## 2017-07-21 ENCOUNTER — Encounter: Payer: Self-pay | Admitting: Physician Assistant

## 2017-07-21 VITALS — BP 136/60 | HR 67 | Temp 98.1°F | Resp 16 | Ht 66.0 in | Wt 211.4 lb

## 2017-07-21 DIAGNOSIS — Z7901 Long term (current) use of anticoagulants: Secondary | ICD-10-CM | POA: Diagnosis not present

## 2017-07-21 DIAGNOSIS — Z5181 Encounter for therapeutic drug level monitoring: Secondary | ICD-10-CM

## 2017-07-21 DIAGNOSIS — I482 Chronic atrial fibrillation: Secondary | ICD-10-CM

## 2017-07-21 DIAGNOSIS — N739 Female pelvic inflammatory disease, unspecified: Secondary | ICD-10-CM

## 2017-07-21 DIAGNOSIS — Z09 Encounter for follow-up examination after completed treatment for conditions other than malignant neoplasm: Secondary | ICD-10-CM | POA: Diagnosis not present

## 2017-07-21 DIAGNOSIS — I4821 Permanent atrial fibrillation: Secondary | ICD-10-CM

## 2017-07-21 LAB — COMPLETE METABOLIC PANEL WITH GFR
AG RATIO: 1 (calc) (ref 1.0–2.5)
ALBUMIN MSPROF: 3.8 g/dL (ref 3.6–5.1)
ALT: 15 U/L (ref 6–29)
AST: 25 U/L (ref 10–35)
Alkaline phosphatase (APISO): 77 U/L (ref 33–130)
BILIRUBIN TOTAL: 0.4 mg/dL (ref 0.2–1.2)
BUN / CREAT RATIO: 20 (calc) (ref 6–22)
BUN: 19 mg/dL (ref 7–25)
CHLORIDE: 94 mmol/L — AB (ref 98–110)
CO2: 30 mmol/L (ref 20–32)
Calcium: 9.4 mg/dL (ref 8.6–10.4)
Creat: 0.97 mg/dL — ABNORMAL HIGH (ref 0.60–0.88)
GFR, EST AFRICAN AMERICAN: 64 mL/min/{1.73_m2} (ref >=60)
GFR, Est Non African American: 55 mL/min/{1.73_m2} — ABNORMAL LOW (ref >=60)
GLOBULIN: 3.8 g/dL — AB (ref 1.9–3.7)
GLUCOSE: 109 mg/dL — AB (ref 65–99)
POTASSIUM: 3.9 mmol/L (ref 3.5–5.3)
SODIUM: 134 mmol/L — AB (ref 135–146)
Total Protein: 7.6 g/dL (ref 6.1–8.1)

## 2017-07-21 LAB — CBC WITH DIFFERENTIAL/PLATELET
BASOS ABS: 29 {cells}/uL (ref 0–200)
Basophils Relative: 0.5 %
EOS ABS: 91 {cells}/uL (ref 15–500)
Eosinophils Relative: 1.6 %
HCT: 33.4 % — ABNORMAL LOW (ref 35.0–45.0)
HEMOGLOBIN: 11.2 g/dL — AB (ref 11.7–15.5)
Lymphs Abs: 1898 cells/uL (ref 850–3900)
MCH: 29.5 pg (ref 27.0–33.0)
MCHC: 33.5 g/dL (ref 32.0–36.0)
MCV: 87.9 fL (ref 80.0–100.0)
MONOS PCT: 11.8 %
MPV: 9.8 fL (ref 7.5–12.5)
NEUTROS ABS: 3010 {cells}/uL (ref 1500–7800)
Neutrophils Relative %: 52.8 %
Platelets: 384 10*3/uL (ref 140–400)
RBC: 3.8 10*6/uL (ref 3.80–5.10)
RDW: 12.4 % (ref 11.0–15.0)
Total Lymphocyte: 33.3 %
WBC: 5.7 10*3/uL (ref 3.8–10.8)
WBCMIX: 673 {cells}/uL (ref 200–950)

## 2017-07-21 NOTE — Progress Notes (Signed)
Patient ID: [name] MRN: [id number], DOB: November 04, 1934, 82 y.o. Date of Encounter: @DATE @  Chief Complaint:  Chief Complaint  Patient presents with  . Hospitalization Follow-up    HPI: 82 y.o. year old female  presents with abpve.   She recently had office visit with me on 07/06/17.  At that visit had complaints of abdominal pain.  Ordered CT scan which revealed abscess.  She was informed to go directly to the hospital. She subsequently was hospitalized 07/07/2017 through 07/16/2017. She had drain placed by radiology under CT guidance to the left lower quadrant.  Culture grew Citrobacter.  Was treated with appropriate antibiotics.   At the time of hospital discharge-- planned for follow-up with interventional radiology in 1 week.  Also follow-up with gynecologist for fluid collection in the uterus.  She had follow-up appointment with gynecology 07/19/17.  Today she reports that she wants me to check her INR.  Says it has not been checked since the hospitalization.  Coumadin was held during hospitalization prior to the procedure.  States that she has been taking Coumadin since she has been at home.  Has been back to her prior Coumadin dose.  She has no other specific concerns to address today.  Hospital discharge summary recommended CBC and CMET to be drawn in  1 week.  These have not yet been checked so will check these today.  She states that home health nurse is coming 3 days a week to flush her drain. She does have follow-up appointments already scheduled for follow-up radiology.   Past Medical History:  Diagnosis Date  . Allergy    rhinitis  . Aortic stenosis    mild by echo 06/2017  . Arthritis   . Breast cancer ([place]) 01/06/2012  . Cancer [facility])    right colon and left breast  . Chronic diastolic CHF (congestive heart failure) ([place])   . Colon cancer ([place]) 01/06/2012  . COPD (chronic obstructive pulmonary disease) ([place])   . Coronary artery disease 2006   nonobstructive  with 20-30% ostial LAD and LM  . Dilated aortic root ([place])    25mmHg by echo 06/2017  . Diverticulosis   . Edema extremities   . GERD (gastroesophageal reflux disease)   . Hernia   . Hiatal hernia    denies  . Hyperlipidemia   . Hypertension   . Mild aortic stenosis    echo 11/2015 but not noted on echo 06/2016  . Osteopenia   . Permanent atrial fibrillation (HCC)    chronic atrial fibrillation  . Pneumonia    hx child  . Pulmonary HTN ([place])    moderate to severe PASP 10mmHg echo 11/2015 - now 2mmHg by echo 06/2017  . Takotsubo syndrome 07/29/2015   a. EF 35-40% by echo; akinesis of mid-apical anteroseptal and apical myocardium.  EF now normalized on echo 11/2015     Home Meds: Outpatient Medications Prior to Visit  Medication Sig Dispense Refill  . acetaminophen (TYLENOL) 325 MG tablet Take 650 mg by mouth every 6 (six) hours as needed for mild pain. Reported on 03/15/2016    . amLODipine (NORVASC) 5 MG tablet Take 1 tablet (5 mg total) by mouth daily. 30 tablet 3  . cefpodoxime (VANTIN) 200 MG tablet Take 2 tablets (400 mg total) 2 (two) times daily by mouth. 28 tablet 0  . cetirizine (ZYRTEC) 10 MG tablet Take 10 mg by mouth daily as needed for allergies.    . clonazePAM (KLONOPIN) 0.5 MG  tablet Take 1 tablet (0.5 mg total) by mouth 2 (two) times daily as needed for anxiety. 60 tablet 1  . cloNIDine (CATAPRES) 0.1 MG tablet TAKE ONE TABLET BY MOUTH TWICE DAILY 60 tablet 11  . HYDROcodone-acetaminophen (NORCO) 7.5-325 MG tablet Take 1 tablet every 6 (six) hours as needed by mouth for severe pain. 30 tablet 0  . losartan (COZAAR) 100 MG tablet Take 1 tablet (100 mg total) by mouth daily. 30 tablet 3  . metoprolol succinate (TOPROL-XL) 50 MG 24 hr tablet TAKE ONE TABLET BY MOUTH ONCE DAILY TAKE  WITH  OR  FOLLOWING  A  MEAL 90 tablet 3  . mirtazapine (REMERON) 30 MG tablet Take 1 tablet (30 mg total) by mouth at bedtime. 30 tablet 1  . montelukast (SINGULAIR) 10 MG tablet Take 1  tablet (10 mg total) by mouth at bedtime. (Patient taking differently: Take 10 mg by mouth at bedtime as needed (for allergies). ) 30 tablet 3  . nitroGLYCERIN (NITROSTAT) 0.4 MG SL tablet Place 1 tablet (0.4 mg total) under the tongue every 5 (five) minutes as needed for chest pain (x 3 pills). 30 tablet 2  . omeprazole (PRILOSEC) 20 MG capsule Take 1 capsule (20 mg total) by mouth daily as needed (heartburn, acid reflux). 90 capsule 3  . OVER THE COUNTER MEDICATION Place 1 drop into both eyes daily as needed (dry eyes). Eye drops    . spironolactone (ALDACTONE) 25 MG tablet TAKE ONE-HALF TABLET BY MOUTH ONCE DAILY 45 tablet 3  . torsemide (DEMADEX) 20 MG tablet Take 60 mg by mouth daily.     Marland Kitchen triamcinolone cream (KENALOG) 0.1 % Apply 1 application topically 2 (two) times daily. 30 g 0  . warfarin (COUMADIN) 1 MG tablet Take 0.5 tablets (0.5 mg total) by mouth as directed. M-W-F-SA (Patient taking differently: Take 3 mg by mouth See admin instructions. Mon/Wed/Fri/Sat) 90 tablet 2  . warfarin (COUMADIN) 5 MG tablet Take 0.5 tablets (2.5 mg total) by mouth See admin instructions. Take half a tablet (2.5 mg) on Tuesdays, Thursdays, and Sundays. Take half a tablet with half a tablet of 1 mg (total 3 mg) on all other days. (Patient taking differently: Take 2.5 mg by mouth See admin instructions. Take 1/2 tablet  on Tuesdays, Thursdays, and Sundays.) 90 tablet 2   No facility-administered medications prior to visit.     Allergies:  Allergies  Allergen Reactions  . Contrast Media [Iodinated Diagnostic Agents] Other (See Comments) and Hives    Per pt strong burning sensation starting in chest radiating outward Per pt strong burning sensation starting in chest radiating outward  . Clonidine Derivatives Other (See Comments)    Throat dry  . Statins Other (See Comments)    "bones hurt"  . Sulfa Antibiotics Diarrhea    Tremors   . Celebrex [Celecoxib] Rash  . Isosorbide Nitrate Itching and Rash  .  Other Itching and Rash    Plastic and paper tape and heart monitor pads  . Tape Itching and Rash    Red Where applied and will spread    Social History   Socioeconomic History  . Marital status: Married    Spouse name: Not on file  . Number of children: Not on file  . Years of education: Not on file  . Highest education level: Not on file  Social Needs  . Financial resource strain: Not on file  . Food insecurity - worry: Not on file  . Food insecurity -  inability: Not on file  . Transportation needs - medical: Not on file  . Transportation needs - non-medical: Not on file  Occupational History  . Not on file  Tobacco Use  . Smoking status: Never Smoker  . Smokeless tobacco: Never Used  Substance and Sexual Activity  . Alcohol use: No  . Drug use: No  . Sexual activity: Not Currently  Other Topics Concern  . Not on file  Social History Narrative  . Not on file    Family History  Problem Relation Age of Onset  . Heart disease Mother   . Heart attack Mother   . Cancer Sister        stomach and colon  . Heart disease Brother 31  . Hypertension Father   . Cancer Sister   . Stroke Neg Hx      Review of Systems:  See HPI for pertinent ROS. All other ROS negative.    Physical Exam: Blood pressure 136/60, pulse 67, temperature 98.1 F (36.7 C), temperature source Oral, resp. rate 16, height 5\' 6"  (1.676 m), weight 95.9 kg (211 lb 6.4 oz), SpO2 98 %., Body mass index is 34.12 kg/m. General: WF. Appears in no acute distress. Neck: Supple. No thyromegaly. No lymphadenopathy. Lungs: Clear bilaterally to auscultation without wheezes, rales, or rhonchi. Breathing is unlabored. Heart: Irreg Abdomen: Drain in place. Musculoskeletal:  Strength and tone normal for age. Extremities/Skin: Warm and dry.  Neuro: Alert and oriented X 3. Moves all extremities spontaneously. Gait is normal. CNII-XII grossly in tact. Psych:  Responds to questions appropriately with a normal affect.      ASSESSMENT AND PLAN:  82 y.o. year old female with  1. Hospital discharge follow-up Check labs to monitor labs now. She has been having appropriate follow-up and has further follow-up scheduled appropriately.  - PT with INR/Fingerstick - CBC with Differential/Platelet - COMPLETE METABOLIC PANEL WITH GFR  2. Monitoring for long-term anticoagulant use - PT with INR/Fingerstick  3. Pelvic abscess in female - CBC with Differential/Platelet - COMPLETE METABOLIC PANEL WITH GFR  4. Permanent atrial fibrillation [facility]) - PT with INR/Fingerstick   Signed, [address] [place], [place], [facility] 07/21/2017 12:20 PM

## 2017-07-22 LAB — PROTIME-INR
INR: 1.9 — AB
Prothrombin Time: 20.5 s — ABNORMAL HIGH (ref 9.0–11.5)

## 2017-07-26 ENCOUNTER — Ambulatory Visit
Admission: RE | Admit: 2017-07-26 | Discharge: 2017-07-26 | Disposition: A | Discharge status: Home / Self Care | Payer: Medicare Other | Source: Ambulatory Visit | Attending: General Surgery | Admitting: General Surgery

## 2017-07-26 ENCOUNTER — Ambulatory Visit
Admission: RE | Admit: 2017-07-26 | Discharge: 2017-07-26 | Disposition: A | Discharge status: Home / Self Care | Payer: Medicare Other | Source: Ambulatory Visit | Attending: Student | Admitting: Student

## 2017-07-26 ENCOUNTER — Other Ambulatory Visit: Payer: Self-pay | Admitting: General Surgery

## 2017-07-26 DIAGNOSIS — K651 Peritoneal abscess: Secondary | ICD-10-CM

## 2017-07-26 DIAGNOSIS — C50912 Malignant neoplasm of unspecified site of left female breast: Secondary | ICD-10-CM

## 2017-07-26 DIAGNOSIS — K5780 Diverticulitis of intestine, part unspecified, with perforation and abscess without bleeding: Secondary | ICD-10-CM | POA: Diagnosis not present

## 2017-07-26 DIAGNOSIS — K5732 Diverticulitis of large intestine without perforation or abscess without bleeding: Secondary | ICD-10-CM | POA: Diagnosis not present

## 2017-07-26 DIAGNOSIS — Z4659 Encounter for fitting and adjustment of other gastrointestinal appliance and device: Secondary | ICD-10-CM | POA: Diagnosis not present

## 2017-07-26 HISTORY — PX: IR RADIOLOGIST EVAL & MGMT: IMG5224

## 2017-07-26 NOTE — Progress Notes (Signed)
Chief Complaint: Abscess  Referring Physician(s): Dr. Marlou Starks  History of Present Illness: [name] is a 82 y.o. female with a history of diverticular abscess.    She is status post CT guided drainage 07/12/2017.    She arrives today for a scheduled follow up appointment, CT scan, and fluoro injection.  She has an appointment with surgery today after our visit.   She denies any fevers.  She has not had fevers/rigors/chills.  She has home health, with every other day sterile saline flushes.  She tells me that she has not recorded any output from the drain, given scant volume.  She has not had any recurrent pain.  Her bowel habits have not changed.    Our CT today shows that she has had resolution of fluid at the drain.  Fluoro guided injection shows no large cavity, and no evidence of fistula.    Past Medical History:  Diagnosis Date  . Allergy    rhinitis  . Aortic stenosis    mild by echo 06/2017  . Arthritis   . Breast cancer ([place]) 01/06/2012  . Cancer [facility])    right colon and left breast  . Chronic diastolic CHF (congestive heart failure) ([place])   . Colon cancer ([place]) 01/06/2012  . COPD (chronic obstructive pulmonary disease) ([place])   . Coronary artery disease 2006   nonobstructive with 20-30% ostial LAD and LM  . Dilated aortic root ([place])    63mmHg by echo 06/2017  . Diverticulosis   . Edema extremities   . GERD (gastroesophageal reflux disease)   . Hernia   . Hiatal hernia    denies  . Hyperlipidemia   . Hypertension   . Mild aortic stenosis    echo 11/2015 but not noted on echo 06/2016  . Osteopenia   . Permanent atrial fibrillation (H[facility])    chronic atrial fibrillation  . Pneumonia    hx child  . Pulmonary HTN ([place])    moderate to severe PASP 3mmHg echo 11/2015 - now 55mmHg by echo 06/2017  . Takotsubo syndrome 07/29/2015   a. EF 35-40% by echo; akinesis of mid-apical anteroseptal and apical myocardium.  EF now normalized on echo 11/2015    Past Surgical  History:  Procedure Laterality Date  . APPENDECTOMY    . BILATERAL NIPPLE BIOPSY Bilateral 05/30/2013   Performed by Jovita Kussmaul, MD at [facility]  . BREAST SURGERY     lumpectomy left  . CARDIAC CATHETERIZATION    . CHOLECYSTECTOMY    . COLECTOMY     right side  . EYE SURGERY Bilateral 14   cataracts  . Left Heart Cath and Coronary Angiography N/A 07/28/2015   Performed by Martinique, Peter M, MD at [facility]  . MASTECTOMY PARTIAL / LUMPECTOMY  2008   left breast  . SPLIT NIGHT STUDY  02/02/2016        Allergies: Contrast media [iodinated diagnostic agents]; Clonidine derivatives; Statins; Sulfa antibiotics; Celebrex [celecoxib]; Isosorbide nitrate; Other; and Tape  Medications: Prior to Admission medications   Medication Sig Start Date End Date Taking? Authorizing Provider  acetaminophen (TYLENOL) 325 MG tablet Take 650 mg by mouth every 6 (six) hours as needed for mild pain. Reported on 03/15/2016    [provider]  amLODipine (NORVASC) 5 MG tablet TAKE ONE TABLET BY MOUTH EVERY DAY 07/22/17   Larey Dresser, MD  cefpodoxime (VANTIN) 200 MG tablet Take 2 tablets (400 mg total) 2 (two) times  daily by mouth. 07/16/17   Georgette Shell, MD  cetirizine (ZYRTEC) 10 MG tablet Take 10 mg by mouth daily as needed for allergies.    [provider]  clonazePAM (KLONOPIN) 0.5 MG tablet Take 1 tablet (0.5 mg total) by mouth 2 (two) times daily as needed for anxiety. 04/04/17   Dena Billet B, PA-C  cloNIDine (CATAPRES) 0.1 MG tablet TAKE ONE TABLET BY MOUTH TWICE DAILY 12/14/16   [name], MD  HYDROcodone-acetaminophen (NORCO) 7.5-325 MG tablet Take 1 tablet every 6 (six) hours as needed by mouth for severe pain. 07/16/17   Georgette Shell, MD  losartan (COZAAR) 100 MG tablet TAKE ONE TABLET BY MOUTH EVERY DAY 07/22/17   Larey Dresser, MD  metoprolol succinate (TOPROL-XL) 50 MG 24 hr tablet TAKE ONE TABLET BY MOUTH ONCE DAILY TAKE  WITH  OR  FOLLOWING   A  MEAL 10/26/16   Dena Billet B, PA-C  mirtazapine (REMERON) 30 MG tablet Take 1 tablet (30 mg total) by mouth at bedtime. 05/04/17   Dena Billet B, PA-C  montelukast (SINGULAIR) 10 MG tablet Take 1 tablet (10 mg total) by mouth at bedtime. Patient taking differently: Take 10 mg by mouth at bedtime as needed (for allergies).  04/28/16   Orlena Sheldon, PA-C  nitroGLYCERIN (NITROSTAT) 0.4 MG SL tablet Place 1 tablet (0.4 mg total) under the tongue every 5 (five) minutes as needed for chest pain (x 3 pills). 10/23/15   Orlena Sheldon, PA-C  omeprazole (PRILOSEC) 20 MG capsule Take 1 capsule (20 mg total) by mouth daily as needed (heartburn, acid reflux). 09/25/15   Dixon, Lonie Peak, PA-C  OVER THE COUNTER MEDICATION Place 1 drop into both eyes daily as needed (dry eyes). Eye drops    [provider]  spironolactone (ALDACTONE) 25 MG tablet TAKE ONE-HALF TABLET BY MOUTH ONCE DAILY 12/28/16   [name], MD  torsemide (DEMADEX) 20 MG tablet Take 60 mg by mouth daily.     [provider]  triamcinolone cream (KENALOG) 0.1 % Apply 1 application topically 2 (two) times daily. 12/31/16   Alycia Rossetti, MD  warfarin (COUMADIN) 1 MG tablet Take 0.5 tablets (0.5 mg total) by mouth as directed. M-W-F-SA Patient taking differently: Take 3 mg by mouth See admin instructions. Mon/Wed/Fri/Sat 07/08/16   Orlena Sheldon, PA-C  warfarin (COUMADIN) 5 MG tablet Take 0.5 tablets (2.5 mg total) by mouth See admin instructions. Take half a tablet (2.5 mg) on Tuesdays, Thursdays, and Sundays. Take half a tablet with half a tablet of 1 mg (total 3 mg) on all other days. Patient taking differently: Take 2.5 mg by mouth See admin instructions. Take 1/2 tablet  on Tuesdays, Thursdays, and Sundays. 07/08/16   Orlena Sheldon, PA-C     Family History  Problem Relation Age of Onset  . Heart disease Mother   . Heart attack Mother   . Cancer Sister        stomach and colon  . Heart disease Brother 10  .  Hypertension Father   . Cancer Sister   . Stroke Neg Hx     Social History   Socioeconomic History  . Marital status: Married    Spouse name: Not on file  . Number of children: Not on file  . Years of education: Not on file  . Highest education level: Not on file  Social Needs  . Financial resource strain: Not on file  . Food  insecurity - worry: Not on file  . Food insecurity - inability: Not on file  . Transportation needs - medical: Not on file  . Transportation needs - non-medical: Not on file  Occupational History  . Not on file  Tobacco Use  . Smoking status: Never Smoker  . Smokeless tobacco: Never Used  Substance and Sexual Activity  . Alcohol use: No  . Drug use: No  . Sexual activity: Not Currently  Other Topics Concern  . Not on file  Social History Narrative  . Not on file       Review of Systems: A 12 point ROS discussed and pertinent positives are indicated in the HPI above.  All other systems are negative.  Review of Systems  Vital Signs: BP (!) 173/63   Pulse (!) 53   Temp 98.2 F (36.8 C)   SpO2 95%   Physical Exam Targeted exam shows no redness at the skin site.  Scan fluid in the drainage bag.   Mallampati Score:     Imaging: Ct Abdomen Pelvis Wo Contrast  Result Date: 07/11/2017 CLINICAL DATA:  History of left pelvic abscess, previously not amenable to percutaneous drainage. Evaluate for interval enlargement. EXAM: CT ABDOMEN AND PELVIS WITHOUT CONTRAST TECHNIQUE: Multidetector CT imaging of the abdomen and pelvis was performed following the standard protocol without IV contrast. COMPARISON:  CT abdomen pelvis - 07/06/2017 ; 04/27/2017; 03/24/2017 FINDINGS: The lack of intravenous contrast limits the ability to evaluate solid abdominal organs. Lower chest: Limited visualization of the lower thorax demonstrates minimal dependent subsegmental atelectasis within the medial basilar segment of the right lower lobe. No focal airspace opacities. No  pleural effusion. Cardiomegaly. No pericardial effusion. Calcifications within the mitral valve annulus. Hepatobiliary: There is mild nodularity hepatic contour suggestive of cirrhotic change. There is a minimal amount of focal fatty infiltration adjacent to the fissure filling and thin teres. Post cholecystectomy. No ascites. Pancreas: Normal noncontrast appearance of the pancreas Spleen: Multiple punctate granuloma are seen within an otherwise normal-appearing spleen. Adrenals/Urinary Tract: Normal noncontrast appearance the bilateral kidneys. No renal stones. No renal stones are seen along expected course of either ureter or the urinary bladder. No evidence of urinary obstruction. Normal noncontrast appearance the bilateral adrenal glands. Normal appearance of the urinary bladder given degree distention. Stomach/Bowel: Re- demonstrated air and fluid containing structure centered within the left adnexa with dominant component measuring approximately 5.2 x 3.3 cm, previously, 5.4 x 3.2 cm. This structure is again associated with adjacent colonic diverticulosis and minimal amount of pericolonic stranding. Re- demonstrated fluid seen within the endometrial canal with dominant component measuring approximately 3.3 x 2.5 cm. No new or enlarging abdominal or pelvic fluid collections. Stable sequela of prior right hemicolectomy. No evidence of enteric obstruction. No pneumoperitoneum, pneumatosis or portal venous gas. Vascular/Lymphatic: Atherosclerotic plaque with a normal caliber abdominal aorta. Scattered retroperitoneal and mesenteric lymph nodes are numerous though individually not enlarged by size criteria with index aortocaval lymph node measuring 0.9 cm in greatest short axis diameter (image 35, series 3), presumably reactive. No enlarging bulky retroperitoneal, mesenteric, pelvic or inguinal lymphadenopathy. Reproductive: As detailed in the stomach/bowel section above. Other: There is a minimal subcutaneous  edema about the midline of the low back. Musculoskeletal: Moderate severe multilevel lumbar spine DDD, worse at L1-L2 and L5-S1 with disc space height loss, endplate irregularity and sclerosis. Stigmata of DISH with the caudal aspect of the thoracic spine. Degenerative change of the pubic symphysis. IMPRESSION: 1. Grossly unchanged size and appearance of  approximately 5.2 cm left adnexal air and fluid collection, incompletely characterized on this noncontrast examination, though again favored to represent secondary involvement of the left adnexa due to adjacent diverticular disease. Given persistence, this structure Streed be amenable to CT-guided percutaneous aspiration and/or drainage catheter placement as clinically indicated. 2. Fluid is again seen within the endometrial canal, an abnormal finding in this postmenopausal patient and given suspected infectious process within the left adnexa, concomitant infection of the endometrium is not excluded on the basis this examination. Correlation for vaginal discharge is recommended. Further evaluation with pelvic examination could be performed as indicated. 3. Colonic diverticulosis without evidence of worsening diverticulitis. No new or enlarging fluid collections within the abdomen or pelvis. 4. Post right hemicolectomy without evidence of enteric obstruction. 5. Nodularity hepatic contour suggestive of early cirrhotic change. Correlation LFTs is recommended. 6. Sequela of prior granulomatous disease as above. 7.  Aortic Atherosclerosis (ICD10-I70.0). Electronically Signed   By: Sandi Mariscal M.D.   On: 07/11/2017 17:02   Ct Abdomen Pelvis Wo Contrast  Result Date: 07/06/2017 CLINICAL DATA:  Left lower quadrant abdominal pain for 3 days. Low-grade fever. EXAM: CT ABDOMEN AND PELVIS WITHOUT CONTRAST TECHNIQUE: Multidetector CT imaging of the abdomen and pelvis was performed following the standard protocol without IV contrast. COMPARISON:  CT scan 04/27/2017 FINDINGS:  Lower chest: The lung bases are clear of acute process. No worrisome pulmonary lesions. The heart is normal in size for age. No pericardial effusion. Hepatobiliary: No worrisome hepatic lesions. Small calcified granulomas are noted. The gallbladder is surgically absent. No common bile duct dilatation. Pancreas: Diffuse fatty change involving the pancreas but no acute inflammation, mass or ductal dilatation. Spleen: Normal size.  Numerous calcified granulomas. Adrenals/Urinary Tract: The adrenal glands and kidneys are unremarkable. No renal, ureteral or bladder calculi or mass. Stomach/Bowel: The stomach, duodenum and small bowel are grossly normal. No acute inflammatory process, mass lesions or obstructive findings. Prior history of right colectomy with ileal colonic anastomosis at the transverse colon. No complicating features. There is mild to moderate inflammation involving the sigmoid colon and significant diverticulosis. Adjacent to the sigmoid colon and closely associated with the patient's left ovary is a 4.9 x 4.4 cm abscess. There is also a thickened fluid filled appearing endometrium which is new since August. Findings most likely due to complicated diverticulitis with abscess involving left ovary and extending into the left fallopian tube and into the endometrial cavity. A small amount of air is also noted in the vagina. I suspect the patient has a vaginal discharge. Vascular/Lymphatic: Small scattered retroperitoneal lymph nodes and a stable peripancreatic node on image number 32. No mesenteric or retroperitoneal mass. Moderate atherosclerotic calcifications involving the aorta and branch vessels. Reproductive: Uterus and ovaries as discussed above. The right ovary appears normal. Other: No free pelvic fluid.  No inguinal mass or adenopathy. Musculoskeletal: No significant bony findings. Osteoporosis is noted. IMPRESSION: 1. 4.9 x 4.4 cm left pelvic abscess with the epicenter in the region of the left  adnexum. I suspect this is complicated diverticulitis with an abscess involving the left ovary and likely extending into the endometrial cavity. 2. Sigmoid diverticulosis. 3. Remote surgical changes involving the colon without complicating features. These results will be called to the ordering clinician or representative by the Radiologist Assistant, and communication documented in the PACS or zVision Dashboard. Electronically Signed   By: Marijo Sanes M.D.   On: 07/06/2017 16:52   Ct Image Guided Drainage By Percutaneous Catheter  Result Date: 07/12/2017  CLINICAL DATA:  Persistent left pelvic diverticular abscess. EXAM: CT GUIDED DRAINAGE OF LEFT PELVIC ABSCESS ANESTHESIA/SEDATION: Intravenous Fentanyl and Versed were administered as conscious sedation during continuous monitoring of the patient's level of consciousness and physiological / cardiorespiratory status by the radiology RN, with a total moderate sedation time of 12 minutes. PROCEDURE: The procedure, risks, benefits, and alternatives were explained to the patient. Questions regarding the procedure were encouraged and answered. The patient understands and consents to the procedure. Select axial scans through the pelvis obtained. The left collection was localized, corresponding to that seen on prior CT, and an appropriate skin entry site determined and marked. The operative field was prepped with chlorhexidinein a sterile fashion, and a sterile drape was applied covering the operative field. A sterile gown and sterile gloves were used for the procedure. Local anesthesia was provided with 1% Lidocaine. Under CT fluoroscopic guidance, 18 gauge trocar needle advanced to the collection. Purulent material could be aspirated. Amplatz guidewire advanced easily within the collection, position confirmed on CT. Tract dilated to facilitate placement of a 10 French pigtail catheter, formed centrally within the collection. 5 mL of greenish purulent aspirate were  sent for Gram stain and culture. Catheter secured externally with 0 Prolene suture, flushed, and placed to gravity drain bag. The patient tolerated the procedure well. COMPLICATIONS: None immediate FINDINGS: Loculated gas and fluid collection in the left pelvis again localized. Percutaneous drain catheter placed as above. Sample of the aspirate sent for Gram stain and culture. IMPRESSION: 1. Technically successful CT-guided left pelvic abscess drain catheter placement. Electronically Signed   By: Lucrezia Europe M.D.   On: 07/12/2017 10:31    Labs:  CBC: Recent Labs    07/11/17 0359 07/13/17 0430 07/14/17 0446 07/21/17 1223  WBC 7.7 7.2 6.8 5.7  HGB 9.9* 10.9* 10.6* 11.2*  HCT 31.1* 34.2* 33.6* 33.4*  PLT 254 319 339 384    COAGS: Recent Labs    07/07/17 0236  07/14/17 0446 07/15/17 0523 07/16/17 0614 07/21/17 1231  INR 2.10   < > 1.27 1.39 1.56 1.9*  APTT 56*  --   --   --   --   --    < > = values in this interval not displayed.    BMP: Recent Labs    07/10/17 0336 07/11/17 0359 07/14/17 0446 07/21/17 1223  NA 138 137 137 134*  K 3.4* 3.1* 3.1* 3.9  CL 97* 96* 95* 94*  CO2 31 30 33* 30  GLUCOSE 94 94 104* 109*  BUN 11 11 10 19   CALCIUM 9.0 8.9 9.1 9.4  CREATININE 1.05* 1.00 1.09* 0.97*  GFRNONAA 49* 52* 47* 55*  GFRAA 57* >60 54* 64    LIVER FUNCTION TESTS: Recent Labs    02/01/17 0941  02/26/17 0252 03/24/17 1229 07/06/17 1510 07/06/17 1855 07/21/17 1223  BILITOT 0.4   < > 0.6 0.4 1.2 1.4* 0.4  AST 20  --  32 21 19 22 25   ALT 18  --  23 16 11 15 15   ALKPHOS 82  --  76 80  --  79  --   PROT 7.5   < > 7.2 8.0 7.0 7.9 7.6  ALBUMIN 4.3  --  3.8 3.7  --  3.6  --    < > = values in this interval not displayed.    TUMOR MARKERS: No results for input(s): AFPTM, CEA, CA199, CHROMGRNA in the last 8760 hours.  Assessment and Plan:  Ms Brouwer is 82 yo  female with recent CT drainage of left pelvic abscess, secondary to diverticulitis.    This was performed on  07/12/2017.  She has no residual abscess cavity on CT and fluoro injection, and no evidence of fistula.   Given these findings, and that she remains asymptomatic, and that there is no significant output, it [place] be reasonable to withdraw the drain.    She has a surgery appointment immediately following our appointment with Dr. Marlou Starks.  I have advised that she keep the drain in until her appointment with [facility].  If her surgery team agrees that removal is reasonable, the drain Walz be removed at the bedside.  This can be with VIR or surgery.  If maintaining the drain is elected, we are happy to see her back at [facility] in a few weeks for a visit for repeat drain check.    Electronically Signed: Corrie Mckusick 07/26/2017, 10:44 AM   I spent a total of  15 Minutes   in face to face in clinical consultation, greater than 50% of which was counseling/coordinating care for diverticular abscess, CT guided drainage.

## 2017-07-29 ENCOUNTER — Other Ambulatory Visit: Payer: Self-pay | Admitting: Physician Assistant

## 2017-07-29 DIAGNOSIS — G4700 Insomnia, unspecified: Secondary | ICD-10-CM

## 2017-08-01 ENCOUNTER — Other Ambulatory Visit: Payer: Self-pay | Admitting: Physician Assistant

## 2017-08-01 ENCOUNTER — Ambulatory Visit: Payer: Medicare Other | Admitting: Obstetrics and Gynecology

## 2017-08-01 DIAGNOSIS — G4700 Insomnia, unspecified: Secondary | ICD-10-CM

## 2017-08-01 NOTE — Telephone Encounter (Signed)
15 mg Remeron has been changed to 30 mg dose.  15 mg refill denied

## 2017-08-01 NOTE — Telephone Encounter (Signed)
Refill appropriate 

## 2017-08-03 ENCOUNTER — Encounter: Payer: Medicare Other | Admitting: Physician Assistant

## 2017-08-03 ENCOUNTER — Other Ambulatory Visit: Payer: Self-pay

## 2017-08-03 ENCOUNTER — Inpatient Hospital Stay: Payer: Medicare Other | Attending: Obstetrics and Gynecology | Admitting: Obstetrics and Gynecology

## 2017-08-03 VITALS — BP 156/73 | HR 65 | Temp 97.7°F | Resp 16 | Ht 66.0 in | Wt 211.6 lb

## 2017-08-03 DIAGNOSIS — N9089 Other specified noninflammatory disorders of vulva and perineum: Secondary | ICD-10-CM | POA: Diagnosis not present

## 2017-08-03 DIAGNOSIS — R971 Elevated cancer antigen 125 [CA 125]: Secondary | ICD-10-CM | POA: Insufficient documentation

## 2017-08-03 DIAGNOSIS — N9489 Other specified conditions associated with female genital organs and menstrual cycle: Secondary | ICD-10-CM | POA: Insufficient documentation

## 2017-08-03 DIAGNOSIS — N949 Unspecified condition associated with female genital organs and menstrual cycle: Secondary | ICD-10-CM

## 2017-08-03 DIAGNOSIS — N858 Other specified noninflammatory disorders of uterus: Secondary | ICD-10-CM | POA: Diagnosis not present

## 2017-08-03 DIAGNOSIS — N904 Leukoplakia of vulva: Secondary | ICD-10-CM | POA: Diagnosis not present

## 2017-08-03 DIAGNOSIS — B373 Candidiasis of vulva and vagina: Secondary | ICD-10-CM | POA: Diagnosis not present

## 2017-08-03 NOTE — Progress Notes (Signed)
  Oncology Nurse Navigator Documentation  Navigator Location: [facility] (08/03/17 1500)   )Navigator Encounter Type: Initial GynOnc (08/03/17 1500)  Chaperoned pelvic exam. Biopsy sent to pathology. Will call Dr. Ilda Basset to push U/S out to 1/9. She will come back on 1/9 to see Dr. Theora Gianotti with CA 125.                   Patient Visit Type: GynOnc (08/03/17 1500)                              Time Spent with Patient: 30 (08/03/17 1500)

## 2017-08-03 NOTE — Progress Notes (Signed)
Patient here for initial visit. She states that today she is having abdominal pain. She denies vaginal discharge.

## 2017-08-03 NOTE — Patient Instructions (Signed)
Vulvar Biopsy, Care After Refer to this sheet in the next few weeks. These instructions provide you with information on caring for yourself after your procedure. Your health care provider [place] also give you more specific instructions. Your treatment has been planned according to current medical practices, but problems sometimes occur. Call your health care provider if you have any problems or questions after your procedure.   WHAT TO EXPECT AFTER THE PROCEDURE After your procedure, it is typical to have the following:  Pelvic and vulvar discomfort and pain  Minimal bleeding from the wound  HOME CARE INSTRUCTIONS  It takes 1-4 weeks to for the biopsy site to heal. Make sure you follow all your health care provider's instructions. Home care instructions [place] include:  Take over the counter pain medicines as needed.   You Leckey take sitz baths or showers to clean wound, rinse, blot and blow dry and also perform after every bowel movement to keep the area clean  If you are having difficulty urinating due to pain you can try urinating in the bath tub or shower, or use spray bottle to dilute urine and this will decrease pain.   Apply xylocaine gel to area as need for pain.    SEEK MEDICAL CARE IF:   You have chills or fever.  You feel dizzy or light-headed.   You have pain or bleeding when you urinate.   You have persistent nausea and vomiting.    You you have heavy bleeding  Your pain medicine is not helping.   SEEK IMMEDIATE MEDICAL CARE IF:   You have a fever and your symptoms suddenly get worse.  You have severe abdominal pain.  You have chest pain.  You have shortness of breath.  You faint.  You have pain, swelling, or redness of your leg.  You have very heavy vaginal or vulvar bleeding with blood clots.  

## 2017-08-03 NOTE — Progress Notes (Signed)
Gynecologic Oncology Consult Visit   Referring Provider: Aletha Halim, MD  Primary Care Provider:  Orlena Sheldon, ([place])   Chief Concern: Adnexal mass  Subjective:  [name] is a 82 y.o. female who is seen in consultation from Dr. Ilda Basset, referred initially by Dr. Doren Custard for abnormal CT scan with adnexal mass and uterine fluid in the setting of diverticulitis.  The patient was most recently admitted to [facility] from 11/1-11/10 for diverticulitis with adnexal abscess. 11/5 CT scan showed stable 5 cm left adnexal air-fluid structure and fluid again seen in the endometrial canal. She had a JP drain placed. 11/6 cultures showed abundant WBC, predominantly PMNs on gram stain and culture showed citrobacter, rare bacteroides and beta-lactamase +  Serial CT scans:  07/06/2017 CT A/P  IMPRESSION: 1. 4.9 x 4.4 cm left pelvic abscess with the epicenter in the region of the left adnexum. I suspect this is complicated diverticulitis with an abscess involving the left ovary and likely extending into the endometrial cavity. 2. Sigmoid diverticulosis. 3. Remote surgical changes involving the colon without complicating features.   07/11/2017 CT A/P  CT scan abdomen and pelvis: Re- demonstrated air and fluid containing structure centered within the left adnexa with dominant component measuring approximately 5.2 x 3.3 cm, previously, 5.4 x 3.2 cm.   Re- demonstrated fluid seen within the endometrial canal with dominant component measuring approximately 3.3 x 2.5 cm.  IMPRESSION: 1. Grossly unchanged size and appearance of approximately 5.2 cm left adnexal air and fluid collection, incompletely characterized on this noncontrast examination, though again favored to represent secondary involvement of the left adnexa due to adjacent diverticular disease. Given persistence, this structure [place] be amenable to CT-guided percutaneous aspiration and/or drainage catheter  placement as clinically indicated. 2. Fluid is again seen within the endometrial canal, an abnormal finding in this postmenopausal patient and given suspected infectious process within the left adnexa, concomitant infection of the endometrium is not excluded on the basis this examination. Correlation for vaginal discharge is recommended. Further evaluation with pelvic examination could be performed as indicated. 3. Colonic diverticulosis without evidence of worsening diverticulitis. No new or enlarging fluid collections within the abdomen or pelvis. 4. Post right hemicolectomy without evidence of enteric obstruction. 5. Nodularity hepatic contour suggestive of early cirrhotic change. Correlation LFTs is recommended. 6. Sequela of prior granulomatous disease as above. 7. Aortic Atherosclerosis (ICD10-I70.0).   She saw Dr. Ilda Basset on 07/19/2017 who recommended pelvic US in 4-6 weeks after the 07/19/2017 scan, and CA125. CA125 was 97 and Gyn Oncology referal made.   07/26/2017 CT A/P  IMPRESSION: Percutaneous drainage catheter in the left pelvis remains in position adjacent to the left adnexa, with resolution of the previous abscess. Diverticular disease of the sigmoid colon, with interval reduction in inflammatory changes. Small volume of residual low-density fluid of the endometrium. Differential again includes pyometra, possibly as an extension of infection from the left adnexa. Surgical changes of hemicolectomy. Aortic Atherosclerosis (ICD10-I70.0).  She also had CT scan from 02/26/2017, 03/24/2017, and 04/27/2017. Impression for all were uterus and ovaries were either normal or unremarkable.   Her past medical history is significant for multiple episodes of diverticulitis, right sided colectomy for CRC, breast cancer and CHF   Problem List: Patient Active Problem List   Diagnosis Date Noted  . Adnexal mass 08/03/2017  . Pelvic abscess in female 07/07/2017  . Aortic stenosis   .  Dyslipidemia, goal LDL below 70 11/28/2016  . Monitoring for long-term anticoagulant  use 12/31/2015  . Chronic diastolic CHF (congestive heart failure) ([place]) 10/28/2015  . Insomnia 10/23/2015  . Leg swelling 08/11/2015  . Takotsubo syndrome 07/29/2015  . NSTEMI (non-ST elevated myocardial infarction) ([place]) 07/28/2015  . SOB (shortness of breath) 08/15/2014  . Dilated aortic root ([place])   . Pulmonary HTN ([place])   . Permanent atrial fibrillation ([place]) 07/04/2013  . Chronic anticoagulation 07/04/2013  . Coronary artery disease   . Lesion of nipple 05/15/2013  . GERD (gastroesophageal reflux disease)   . Hypertension   . COPD (chronic obstructive pulmonary disease) ([place])   . Osteopenia   . Diverticulosis   . Hiatal hernia   . Abdominal pain 01/11/2012  . Breast cancer of upper-outer quadrant of left female breast ([place]) 01/06/2012  . Colon cancer ([place]) 01/06/2012    Past Medical History: Past Medical History:  Diagnosis Date  . Allergy    rhinitis  . Aortic stenosis    mild by echo 06/2017  . Arthritis   . Breast cancer ([place]) 01/06/2012  . Cancer [facility])    right colon and left breast  . Chronic diastolic CHF (congestive heart failure) ([place])   . Colon cancer ([place]) 01/06/2012  . COPD (chronic obstructive pulmonary disease) ([place])   . Coronary artery disease 2006   nonobstructive with 20-30% ostial LAD and LM  . Dilated aortic root ([place])    37mmHg by echo 06/2017  . Diverticulosis   . Edema extremities   . GERD (gastroesophageal reflux disease)   . Hernia   . Hiatal hernia    denies  . Hyperlipidemia   . Hypertension   . Mild aortic stenosis    echo 11/2015 but not noted on echo 06/2016  . Osteopenia   . Permanent atrial fibrillation ([facility])    chronic atrial fibrillation  . Pneumonia    hx child  . Pulmonary HTN ([place])    moderate to severe PASP 87mmHg echo 11/2015 - now 87mmHg by echo 06/2017  . Takotsubo syndrome 07/29/2015   a. EF 35-40% by echo; akinesis of mid-apical  anteroseptal and apical myocardium.  EF now normalized on echo 11/2015    Past Surgical History: Past Surgical History:  Procedure Laterality Date  . APPENDECTOMY    . BREAST SURGERY     lumpectomy left  . CARDIAC CATHETERIZATION    . CARDIAC CATHETERIZATION N/A 07/28/2015   Procedure: Left Heart Cath and Coronary Angiography;  Surgeon: Peter M Martinique, MD;  Location: [facility];  Service: Cardiovascular;  Laterality: N/A;  . CHOLECYSTECTOMY    . COLECTOMY     right side  . EXCISION OF ACCESSORY NIPPLE Bilateral 05/30/2013   Procedure: BILATERAL NIPPLE BIOPSY;  Surgeon: Merrie Roof, MD;  Location: [facility];  Service: General;  Laterality: Bilateral;  . EYE SURGERY Bilateral 12   cataracts  . MASTECTOMY PARTIAL / LUMPECTOMY  2008   left breast  . SPLIT NIGHT STUDY  02/02/2016        Past Gynecologic History:  As per HPI  OB History:  OB History  Gravida Para Term Preterm AB Living  2 1 1   1 1   SAB TAB Ectopic Multiple Live Births  1       1    # Outcome Date GA Lbr Len/2nd Weight Sex Delivery Anes PTL Lv  2 Term      Vag-Spont   LIV  1 SAB      SAB  Family History: Family History  Problem Relation Age of Onset  . Heart disease Mother   . Heart attack Mother   . Cancer Sister        stomach and colon  . Heart disease Brother 68  . Hypertension Father   . Cancer Sister   . Stroke Neg Hx     Social History: Social History   Socioeconomic History  . Marital status: Married    Spouse name: Not on file  . Number of children: Not on file  . Years of education: Not on file  . Highest education level: Not on file  Social Needs  . Financial resource strain: Not on file  . Food insecurity - worry: Not on file  . Food insecurity - inability: Not on file  . Transportation needs - medical: Not on file  . Transportation needs - non-medical: Not on file  Occupational History  . Not on file  Tobacco Use  . Smoking status: Never Smoker  . Smokeless  tobacco: Never Used  Substance and Sexual Activity  . Alcohol use: No  . Drug use: No  . Sexual activity: Not Currently  Other Topics Concern  . Not on file  Social History Narrative  . Not on file    Allergies: Allergies  Allergen Reactions  . Contrast Media [Iodinated Diagnostic Agents] Other (See Comments) and Hives    Per pt strong burning sensation starting in chest radiating outward Per pt strong burning sensation starting in chest radiating outward  . Clonidine Derivatives Other (See Comments)    Throat dry  . Statins Other (See Comments)    "bones hurt"  . Sulfa Antibiotics Diarrhea    Tremors   . Celebrex [Celecoxib] Rash  . Isosorbide Nitrate Itching and Rash  . Other Itching and Rash    Plastic and paper tape and heart monitor pads  . Tape Itching and Rash    Red Where applied and will spread    Current Medications: Current Outpatient Medications  Medication Sig Dispense Refill  . acetaminophen (TYLENOL) 325 MG tablet Take 650 mg by mouth every 6 (six) hours as needed for mild pain. Reported on 03/15/2016    . amLODipine (NORVASC) 5 MG tablet TAKE ONE TABLET BY MOUTH EVERY DAY 30 tablet 3  . amoxicillin-clavulanate (AUGMENTIN) 875-125 MG tablet Take 875 tablets by mouth 2 (two) times daily.  0  . cetirizine (ZYRTEC) 10 MG tablet Take 10 mg by mouth daily as needed for allergies.    . clonazePAM (KLONOPIN) 0.5 MG tablet Take 1 tablet (0.5 mg total) by mouth 2 (two) times daily as needed for anxiety. 60 tablet 1  . cloNIDine (CATAPRES) 0.1 MG tablet TAKE ONE TABLET BY MOUTH TWICE DAILY 60 tablet 11  . HYDROcodone-acetaminophen (NORCO) 7.5-325 MG tablet Take 1 tablet every 6 (six) hours as needed by mouth for severe pain. 30 tablet 0  . losartan (COZAAR) 100 MG tablet TAKE ONE TABLET BY MOUTH EVERY DAY 30 tablet 3  . metoprolol succinate (TOPROL-XL) 50 MG 24 hr tablet TAKE ONE TABLET BY MOUTH ONCE DAILY TAKE  WITH  OR  FOLLOWING  A  MEAL 90 tablet 3  . mirtazapine  (REMERON) 15 MG tablet TAKE 1 TABLET BY MOUTH AT BEDTIME 30 tablet 2  . montelukast (SINGULAIR) 10 MG tablet Take 1 tablet (10 mg total) by mouth at bedtime. (Patient taking differently: Take 10 mg by mouth at bedtime as needed (for allergies). ) 30 tablet 3  . nitroGLYCERIN (  NITROSTAT) 0.4 MG SL tablet Place 1 tablet (0.4 mg total) under the tongue every 5 (five) minutes as needed for chest pain (x 3 pills). 30 tablet 2  . omeprazole (PRILOSEC) 20 MG capsule Take 1 capsule (20 mg total) by mouth daily as needed (heartburn, acid reflux). 90 capsule 3  . OVER THE COUNTER MEDICATION Place 1 drop into both eyes daily as needed (dry eyes). Eye drops    . spironolactone (ALDACTONE) 25 MG tablet TAKE ONE-HALF TABLET BY MOUTH ONCE DAILY 45 tablet 3  . torsemide (DEMADEX) 20 MG tablet Take 60 mg by mouth daily.     Marland Kitchen triamcinolone cream (KENALOG) 0.1 % Apply 1 application topically 2 (two) times daily. 30 g 0  . warfarin (COUMADIN) 1 MG tablet Take 0.5 tablets (0.5 mg total) by mouth as directed. M-W-F-SA (Patient taking differently: Take 3 mg by mouth See admin instructions. Mon/Wed/Fri/Sat) 90 tablet 2  . warfarin (COUMADIN) 5 MG tablet Take 0.5 tablets (2.5 mg total) by mouth See admin instructions. Take half a tablet (2.5 mg) on Tuesdays, Thursdays, and Sundays. Take half a tablet with half a tablet of 1 mg (total 3 mg) on all other days. (Patient taking differently: Take 2.5 mg by mouth See admin instructions. Take 1/2 tablet  on Tuesdays, Thursdays, and Sundays.) 90 tablet 2   No current facility-administered medications for this visit.     Review of Systems General: weight loss  HEENT: ringing in the ears  Lungs: dyspnea  Cardiac: chest pain  GI: diarrhea  GU: incomplete emptying of the bladder; no vaginal bleeding or discharge. No vulvar irritation.   Musculoskeletal: back pain  Extremities: no complaints  Skin: no complaints  Neuro: no complaints  Endocrine: no complaints  Psych: no  complaints    Heme: bruising   Objective:  Physical Examination:  BP (!) 156/73 (BP Location: Right Arm, Patient Position: Sitting)   Pulse 65   Temp 97.7 F (36.5 C) (Tympanic)   Resp 16   Ht 5\' 6"  (1.676 m)   Wt 211 lb 9.6 oz (96 kg)   BMI 34.15 kg/m    ECOG Performance Status: 1 - Symptomatic but completely ambulatory  General appearance: alert, cooperative and appears stated age HEENT: ATNC Lymph node survey: non-palpable, axillary, inguinal, supraclavicular Cardiovascular: RRR Respiratory: B CTA. Abdomen: SNDNT. Incision all well healed. No hernias, no masses, no ascites. Dressing in LLQ where drain was placed.  Extremities:No edema Skin exam - Well healed incisions.  Neurological exam reveals alert, oriented, normal speech, no focal findings or movement disorder noted   Pelvic: exam chaperoned by nurse;  Vulva: normal appearing vulva except for periclitoral area of leukoplakia. Nontender. No other lesions; Vagina: normal; Adnexa: limited exam due to habitus, no masses detected; Uterus:  limited exam due to habitus, nontender; Cervix: no lesions; Rectal: confirmatory.   The risks and benefits of the procedure were reviewed and informed consent obtained. Time out was performed. The patient received pre-procedure teaching and expressed understanding. The post-procedure instructions were reviewed with the patient and she expressed understanding. The patient does not have any barriers to learning.  A leukoplakic lesion was visible on the periclitoral area. The area was numbed with hurricane jelly and injected with lidocaine. A biopsy forceps was used to obtained a biopsy at 9 o'clock in relationship with the clitoris.  Hemostasis was obtained with silver nitrate. She tolerated the procedure well.   Lab Review As noted in HPI  Radiologic Imaging: As noted in HPI  Assessment:  [name] is a 82 y.o. female diagnosed with adnexal mass and uterine fluid in the setting of  diverticulitis. Asymptomatic. Suspect findings due to infectious sequela. Elevated CA125 is nonspecific and [place] be due infection/diverticulitis.      Medical co-morbidities complicating care: h/o mild Aortic stenosis; h/o breast cancer/colon cancer; CHF; COPD; CAD; dilated aortic root; GERD; HTN; atrial fibrillation ([place]); Pulmonary HTN ([place]) - moderate to severe PASP 99mmHg echo 11/2015 - now 63mmHg by echo 06/2017; h/o Takotsubo syndrome; and s/p multiple laparotomies.    Plan:   Problem List Items Addressed This Visit      Other   Adnexal mass - Primary    Other Visit Diagnoses    Uterine fluid accumulation       Elevated CA-125       Vulvar lesion          We discussed options for management including and I recommended continued surveillance with repeat ultrasound (scheduled on 08/15/2017 - will push back to 09/14/2016) and repeat CA125 that same day with return visit in [place] clinic.   Follow up vulvar biopsy.   Suggested return to clinic in ~  4 weeks.    The patient's diagnosis, an outline of the further diagnostic and laboratory studies which will be required, the recommendation, and alternatives were discussed.  All questions were answered to the patient's satisfaction.  A total of 60 minutes were spent with the patient/family today; 50% was spent in education, counseling and coordination of care. Patient was seen in conjunction with Beckey Rutter, NP.  Gillis Ends, MD    CC:  Referring Provider: Aletha Halim, MD  Primary Care Provider:  Orlena [place]on, ([place])

## 2017-08-04 NOTE — Progress Notes (Signed)
Patient walked in to clinic with concerns of itching and burning of vulva consistent with yeast infection. Biopsy of vulva was performed by Dr. Theora Gianotti yesterday with clinical suspicion of yeast. Patient denies pain. Patient has recently used multiple antibiotics for treatment of diverticulitis. Consulted Dr. Theora Gianotti for recommendation. Advised patient to use OTC Monistat to vulva for next 5-7 days. Patient to call if symptoms do not improve by Monday with understanding that resolution [place] take a week. If biopsy results are not consistent with yeast I will call patient.

## 2017-08-05 ENCOUNTER — Telehealth: Payer: Self-pay | Admitting: Nurse Practitioner

## 2017-08-05 LAB — SURGICAL PATHOLOGY

## 2017-08-05 NOTE — Telephone Encounter (Signed)
Called patient with path results. Pt has started monistat ointment and is noticing improvement. No questions at this time.

## 2017-08-08 ENCOUNTER — Telehealth: Payer: Self-pay

## 2017-08-08 NOTE — Telephone Encounter (Signed)
Appointment arranged to see Dr. Theora Gianotti back on 1/2 with U/S results and labs. I have contacted Dr. Ilda Basset via inbox and contacted his office to have U/S rescheduled for 12/31 or 1/2 in the am. Awaiting response. Oncology Nurse Navigator Documentation  Navigator Location: [facility] (08/08/17 1400)   )Navigator Encounter Type: Telephone (08/08/17 1400)                                                    Time Spent with Patient: 15 (08/08/17 1400)

## 2017-08-11 ENCOUNTER — Other Ambulatory Visit: Payer: Self-pay

## 2017-08-11 ENCOUNTER — Telehealth: Payer: Self-pay

## 2017-08-11 DIAGNOSIS — K5732 Diverticulitis of large intestine without perforation or abscess without bleeding: Secondary | ICD-10-CM | POA: Diagnosis not present

## 2017-08-11 DIAGNOSIS — N9489 Other specified conditions associated with female genital organs and menstrual cycle: Secondary | ICD-10-CM

## 2017-08-11 NOTE — Telephone Encounter (Signed)
Second call placed to Dr. Thomes Dinning office requesting U/S date to be changed to 12/31 per Dr. Theora Gianotti request. She will follow up with Dr. Theora Gianotti in clinic for results 1/2. While on hold awaiting response, phone was disconnected and I was unable to get back through. Oncology Nurse Navigator Documentation  Navigator Location: [facility] (08/11/17 1500)   )Navigator Encounter Type: Telephone (08/11/17 1500) Telephone: Outgoing Call (08/11/17 1500)                                                  Time Spent with Patient: 30 (08/11/17 1500)

## 2017-08-12 ENCOUNTER — Telehealth: Payer: Self-pay

## 2017-08-12 DIAGNOSIS — N9489 Other specified conditions associated with female genital organs and menstrual cycle: Secondary | ICD-10-CM

## 2017-08-12 DIAGNOSIS — H02055 Trichiasis without entropian left lower eyelid: Secondary | ICD-10-CM | POA: Diagnosis not present

## 2017-08-12 NOTE — Telephone Encounter (Signed)
Ultrasound has now been cancelled from Dr. Thomes Dinning office. New orders placed for U/S to be arranged for 12/31. She already has appointments arranged for labs and to see Dr. Theora Gianotti on 1/2. Voicemail left with Ms. Loren regarding. Oncology Nurse Navigator Documentation  Navigator Location: [facility] (08/12/17 1100)   )Navigator Encounter Type: Telephone (08/12/17 1100)                                                    Time Spent with Patient: 15 (08/12/17 1100)

## 2017-08-15 ENCOUNTER — Other Ambulatory Visit: Payer: Medicare Other

## 2017-08-16 ENCOUNTER — Encounter: Payer: Self-pay | Admitting: Student

## 2017-08-16 NOTE — Addendum Note (Signed)
Addended by: Aletha Halim on: 08/16/2017 04:13 PM   Modules accepted: Orders

## 2017-08-17 DIAGNOSIS — L300 Nummular dermatitis: Secondary | ICD-10-CM | POA: Diagnosis not present

## 2017-08-19 ENCOUNTER — Telehealth: Payer: Self-pay | Admitting: *Deleted

## 2017-08-19 ENCOUNTER — Other Ambulatory Visit: Payer: Self-pay | Admitting: Nurse Practitioner

## 2017-08-19 NOTE — Telephone Encounter (Signed)
Patient called requesting to speak with Kristi. She would like a return call as soon as possible.

## 2017-08-22 ENCOUNTER — Telehealth: Payer: Self-pay

## 2017-08-22 NOTE — Telephone Encounter (Signed)
Returned call. Confirmed upcoming U./S appointment in [place] as well as lab and Dr. Theora Gianotti on 1/2. Read back performed. Oncology Nurse Navigator Documentation  Navigator Location: [facility] (08/22/17 0800)   )Navigator Encounter Type: Telephone (08/22/17 0800) Telephone: Lahoma Crocker Call (08/22/17 0800)                                                  Time Spent with Patient: 15 (08/22/17 0800)

## 2017-08-26 ENCOUNTER — Ambulatory Visit: Payer: Medicare Other | Admitting: Family Medicine

## 2017-08-26 ENCOUNTER — Other Ambulatory Visit: Payer: Self-pay

## 2017-08-26 ENCOUNTER — Encounter: Payer: Self-pay | Admitting: Family Medicine

## 2017-08-26 VITALS — BP 138/62 | HR 70 | Temp 99.2°F | Resp 14 | Ht 66.0 in | Wt 208.0 lb

## 2017-08-26 DIAGNOSIS — N9489 Other specified conditions associated with female genital organs and menstrual cycle: Secondary | ICD-10-CM

## 2017-08-26 DIAGNOSIS — N949 Unspecified condition associated with female genital organs and menstrual cycle: Secondary | ICD-10-CM

## 2017-08-26 DIAGNOSIS — K219 Gastro-esophageal reflux disease without esophagitis: Secondary | ICD-10-CM

## 2017-08-26 DIAGNOSIS — K5730 Diverticulosis of large intestine without perforation or abscess without bleeding: Secondary | ICD-10-CM

## 2017-08-26 MED ORDER — AMOXICILLIN-POT CLAVULANATE 875-125 MG PO TABS: 1.0000 | ORAL_TABLET | Freq: Two times a day (BID) | ORAL | 0 refills | Status: DC

## 2017-08-26 NOTE — Patient Instructions (Signed)
Take the Augmentin twice a day with food Take the prilosec twice a day for 2 weeks  F/U as needed

## 2017-08-26 NOTE — Progress Notes (Signed)
Subjective:    Patient ID: [name], female    DOB: 03-31-1935, 82 y.o.   MRN: [id number]  Patient presents for Illness (x1 week- post nasal drip, "tickle" to back of throat)   Has tickle in her throat, started a week ago, mild sinus pressure, no significant cough.No nasal discharge  Used hard candy helped tickle. States stomach stays sore, no diarrhea, occ sees blood with hemorroidal bleeding. No vomiting, no fever. Appetite has been down, weight has been stabile past few months  Once she eats feels like gets stuck at epigastric ( she points there)  GI- Dr. Cristina Gong  Noted urine was darker to, has some burning on and off  And urine is strong, but states she cant leave a sample   Taking prilosec daily      Review Of Systems:  GEN- denies fatigue, fever, weight loss,weakness, recent illness HEENT- denies eye drainage, change in vision, nasal discharge, CVS- denies chest pain, palpitations RESP- denies SOB, cough, wheeze ABD- denies N/V, change in stools, +abd pain GU- denies dysuria, hematuria, dribbling, incontinence MSK- denies joint pain, muscle aches, injury Neuro- denies headache, dizziness, syncope, seizure activity       Objective:    BP 138/62   Pulse 70   Temp 99.2 F (37.3 C) (Oral)   Resp 14   Ht 5\' 6"  (1.676 m)   Wt 208 lb (94.3 kg)   SpO2 97%   BMI 33.57 kg/m  GEN- NAD, alert and oriented x3,well appearing  HEENT- PERRL, EOMI, non injected sclera, pink conjunctiva, MMM, oropharynx clear, TM clear bilat, nares clear, no sinus tenderness Neck- Supple, no thyromegaly, np lad CVS- RRR, no murmur RESP-CTAB ABD-NABS,soft,MILD ttp LLQ, no rebound, no guarding EXT- chronci non pitting edema/varicose veins  Pulses- Radial 2+        Assessment & Plan:      Problem List Items Addressed This Visit      Unprioritized   Diverticulosis - Primary   Adnexal mass   GERD (gastroesophageal reflux disease)    Tickle in her throat and her epigastric  discomfort [place] be more of her reflux very difficult to tease out her symptoms that she does not describe very well.  She did however recently have a CT scan showing the adnexal mass.  She also has known diverticulosis but not the typical symptoms of diverticulitis in the masses in the same region of her discomfort in the left lower quadrant which is already being worked up by [employer] and oncology.  At any rate she cannot leave a urine sample to rule out urinary tract infection and with all of her symptoms and based on history I am going to increase her proton pump inhibitor Prilosec to 20 mg twice a day to help with any reflux gastritis symptoms.  I will give her Augmentin to take for 1 week this will cover any urinary tract infection as well as any diverticulitis.  She will follow-up with her oncologist for her ultrasound in about a week         Note: This dictation was prepared with Dragon dictation along with smaller phrase technology. Any transcriptional errors that result from this process are unintentional.

## 2017-08-26 NOTE — Assessment & Plan Note (Signed)
Tickle in her throat and her epigastric discomfort [place] be more of her reflux very difficult to tease out her symptoms that she does not describe very well.  She did however recently have a CT scan showing the adnexal mass.  She also has known diverticulosis but not the typical symptoms of diverticulitis in the masses in the same region of her discomfort in the left lower quadrant which is already being worked up by [employer] and oncology.  At any rate she cannot leave a urine sample to rule out urinary tract infection and with all of her symptoms and based on history I am going to increase her proton pump inhibitor Prilosec to 20 mg twice a day to help with any reflux gastritis symptoms.  I will give her Augmentin to take for 1 week this will cover any urinary tract infection as well as any diverticulitis.  She will follow-up with her oncologist for her ultrasound in about a week

## 2017-08-27 IMAGING — CT CT ABD-PELV W/O CM
1 of 2 series · 15 of 32 positions shown, 19 images · non-contrast
Comparison: 03/24/2017

CLINICAL DATA: Follow-up diverticulitis

EXAM:
CT ABDOMEN AND PELVIS WITHOUT CONTRAST
TECHNIQUE: Multidetector CT imaging of the abdomen and pelvis was performed
following the standard protocol without IV contrast.

[Series 2: abd/pelvis w/(date) · axial · 0.86mm/px · z∈[+594,+984]mm · 15 of 89 slices shown, 19 images]
[im 7/89  soft-tissue]
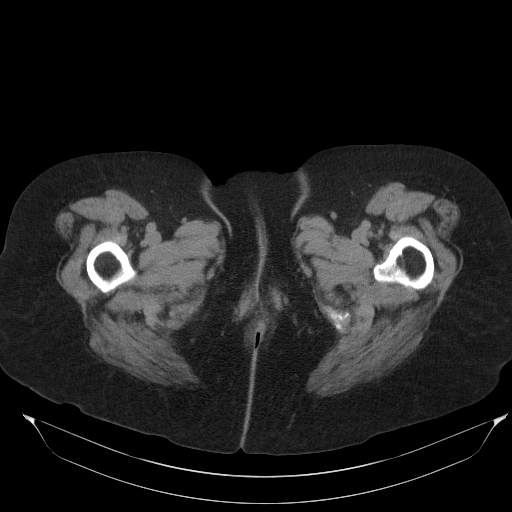
[im 7/89  bone]
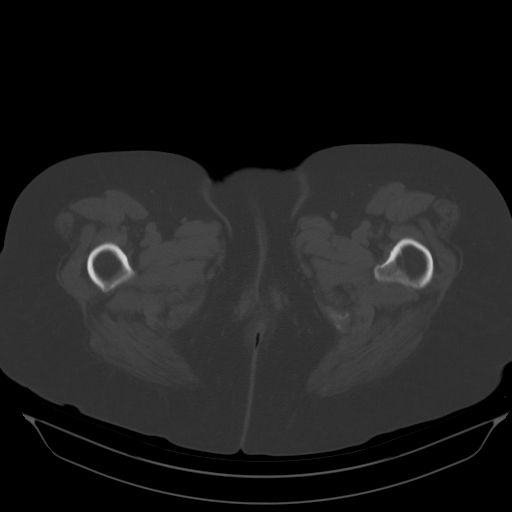
[im 13/89  soft-tissue]
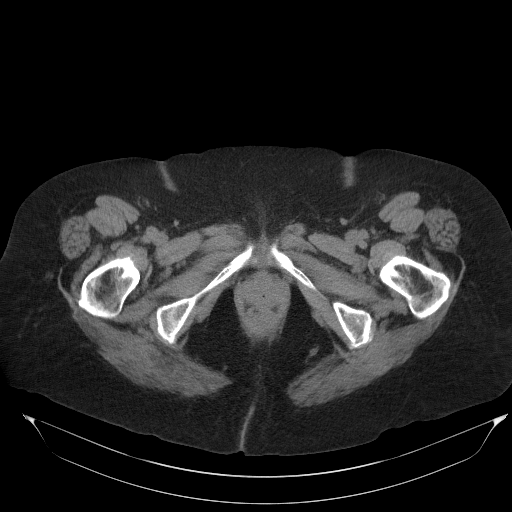
[im 19/89  soft-tissue]
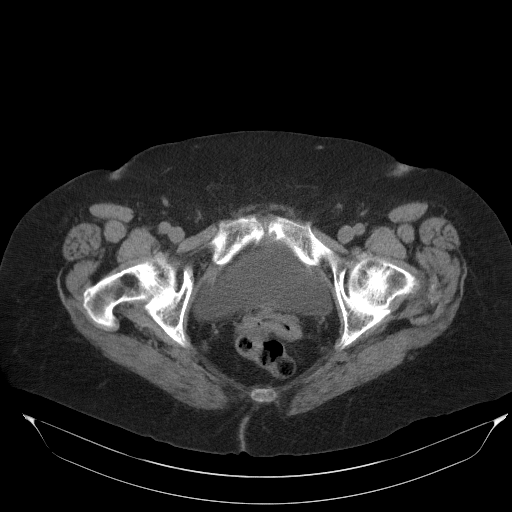
[im 26/89  soft-tissue]
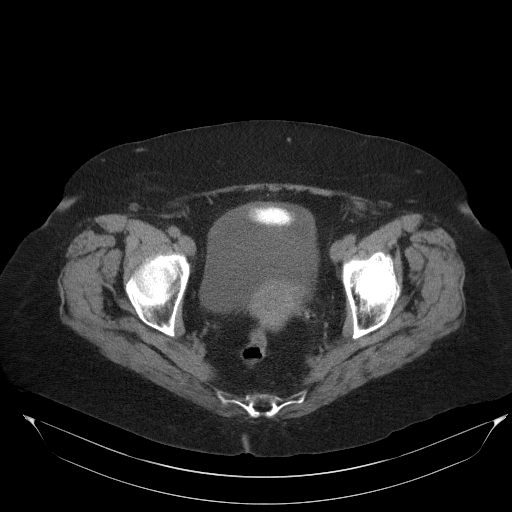
[im 32/89  soft-tissue]
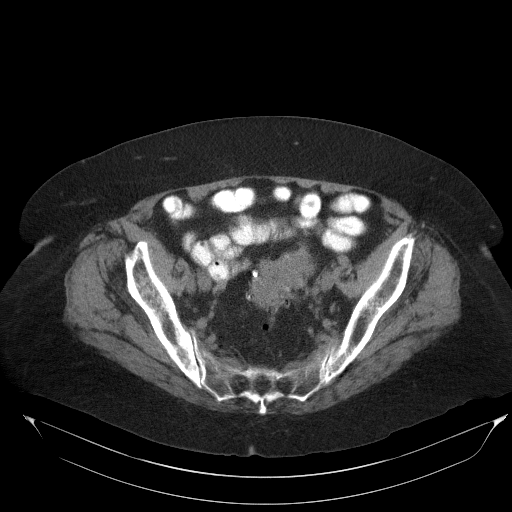
[im 38/89  soft-tissue]
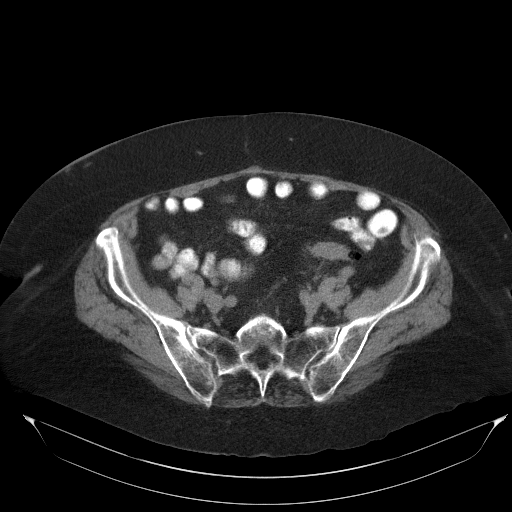
[im 45/89  soft-tissue]
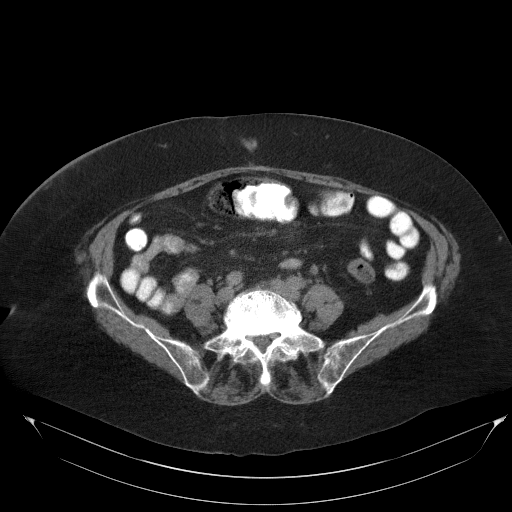
[im 51/89  soft-tissue]
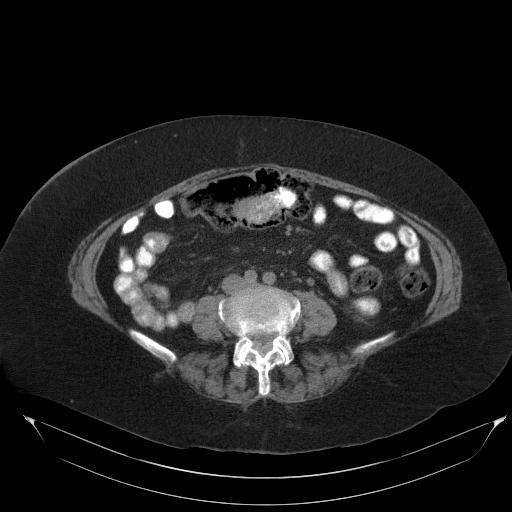
[im 57/89  soft-tissue]
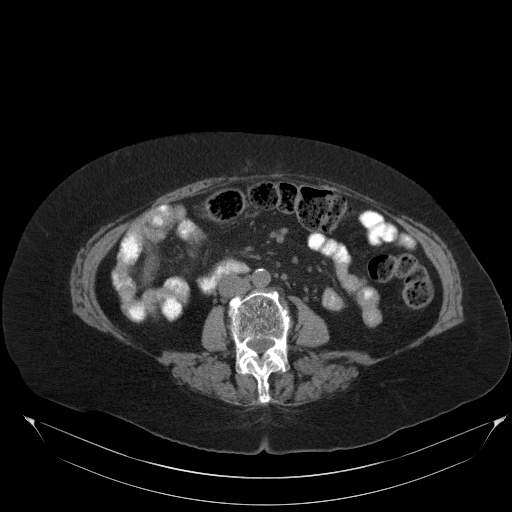
[im 57/89  bone]
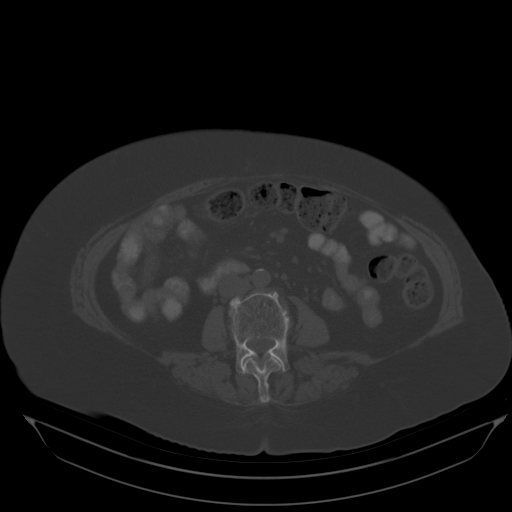
[im 63/89  soft-tissue]
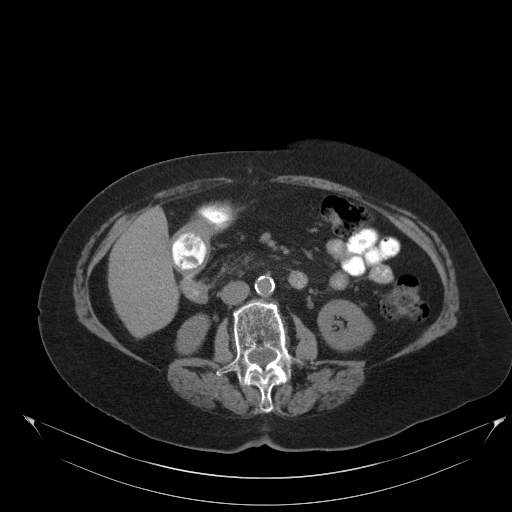
[im 70/89  soft-tissue]
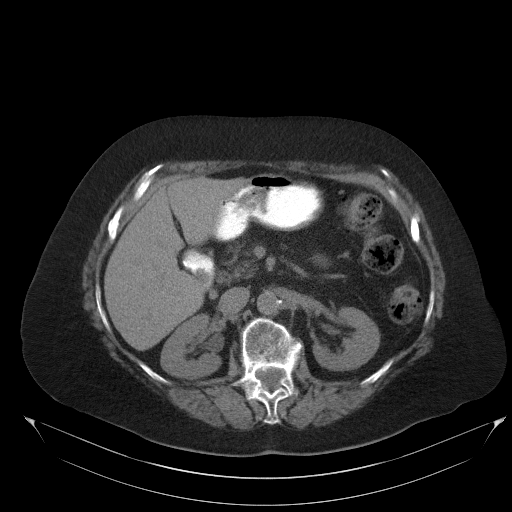
[im 76/89  soft-tissue]
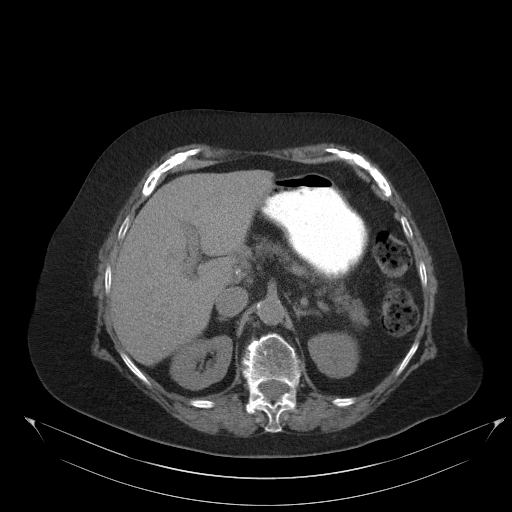
[im 76/89  lung]
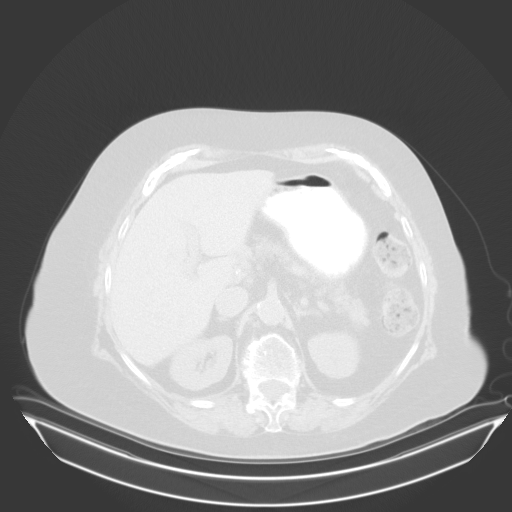
[im 79/89  lung]
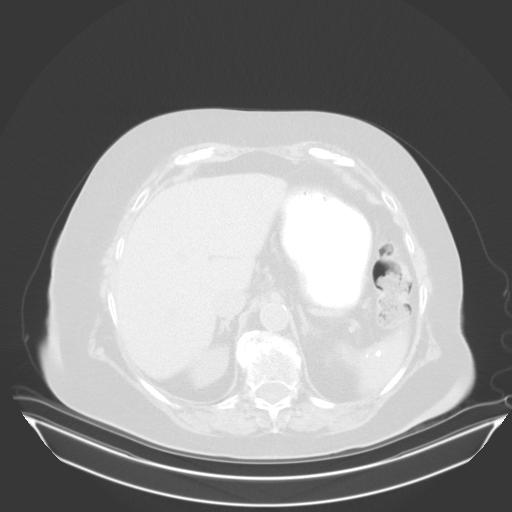
[im 82/89  soft-tissue]
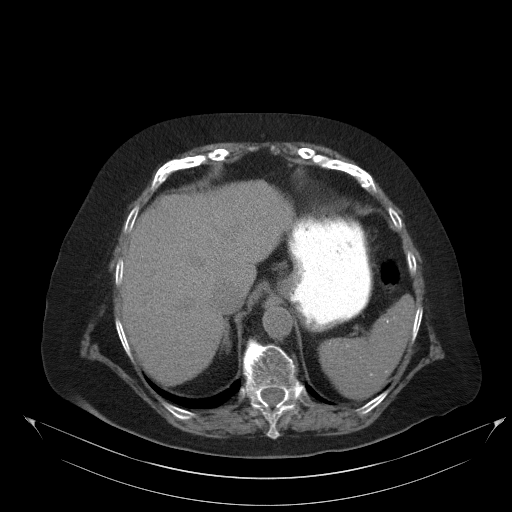
[im 82/89  lung]
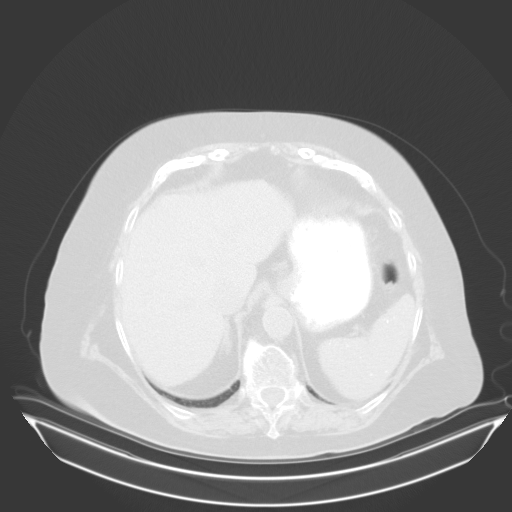
[im 85/89  lung]
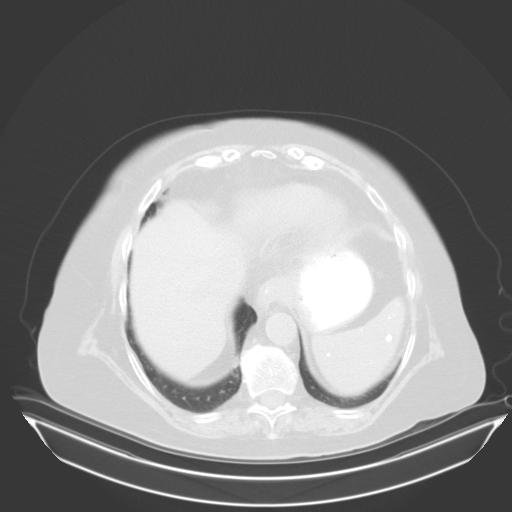

[15 of 32 positions shown; findings below may reference images not displayed]

FINDINGS: Lower chest: No acute abnormality.

Hepatobiliary: No focal liver abnormality is seen. Status post
cholecystectomy. No biliary dilatation.

Pancreas: Unremarkable. No pancreatic ductal dilatation or
surrounding inflammatory changes.

Spleen: Multiple calcified granulomas are noted.

Adrenals/Urinary Tract: Adrenal glands are unremarkable. Kidneys are
normal, without renal calculi, focal lesion, or hydronephrosis.
Bladder is unremarkable.

Stomach/Bowel: Diverticular change of the colon is identified. The
diffuse thickening within the sigmoid colon is again seen. The
pericolonic inflammatory changes seen previously half predominately
resolves. Some minimal changes remain. No abscess or perforation is
noted.

Vascular/Lymphatic: Aortic atherosclerosis. No enlarged abdominal or
pelvic lymph nodes.

Reproductive: Uterus and bilateral adnexa are unremarkable.

Other: No abdominal wall hernia or abnormality. No abdominopelvic
ascites.

Musculoskeletal: Degenerative changes of the lumbar spine are noted.
IMPRESSION: Persistent diverticular change of the colon is noted particularly in
the sigmoid with wall thickening consistent with smooth muscle
hypertrophy. Some minimal residual inflammatory changes are noted
but improved from the prior exam. No abscess or perforation is
noted.

## 2017-08-31 ENCOUNTER — Other Ambulatory Visit: Payer: Self-pay | Admitting: Physician Assistant

## 2017-08-31 DIAGNOSIS — M19071 Primary osteoarthritis, right ankle and foot: Secondary | ICD-10-CM | POA: Diagnosis not present

## 2017-09-01 ENCOUNTER — Encounter: Payer: Medicare Other | Admitting: Physician Assistant

## 2017-09-05 ENCOUNTER — Ambulatory Visit
Admission: RE | Admit: 2017-09-05 | Discharge: 2017-09-05 | Disposition: A | Discharge status: Home / Self Care | Payer: Medicare Other | Source: Ambulatory Visit | Attending: Nurse Practitioner | Admitting: Nurse Practitioner

## 2017-09-05 DIAGNOSIS — N838 Other noninflammatory disorders of ovary, fallopian tube and broad ligament: Secondary | ICD-10-CM | POA: Diagnosis not present

## 2017-09-05 DIAGNOSIS — N9489 Other specified conditions associated with female genital organs and menstrual cycle: Secondary | ICD-10-CM

## 2017-09-06 NOTE — Progress Notes (Signed)
Gynecologic Oncology Interval Visit   Referring Provider: Aletha Halim, MD  Primary Care Provider:  Orlena Sheldon, ([place])   Chief Concern: Adnexal mass  Subjective:  [name] is a 83 y.o. female who is seen in consultation from Dr. Ilda Basset, referred initially by Dr. Ilda Basset for abnormal CT scan with adnexal mass and uterine fluid in the setting of diverticulitis.  She presents today for follow up and discussion of her ultrasound.   Repeat US 09/05/2017 COMPARISON:  Abdominal and pelvic CT scan dated July 26, 2017  FINDINGS: Uterus: Measurements: 6.9 x 3.2. There are echogenic foci within the fundus compatible with tiny fibroids.  Endometrium: Thickness: 8.3 mm. There is filling of the endometrial cavity with fluid and with a solid mass to the left of midline measuring 1.2 x 0.7 x 1.7 cm.  Bilateral ovares: Not visualized  Other findings  The patient was very tender on this study. On the left there is fluid that appears to track from the superficial aspect of the left lower quadrant to an area of acoustical shadowing in the left adnexal region.  IMPRESSION: Abnormal appearance of the endometrial cavity where there is fluid and a 1.2 x 0.7 x 1.7 cm endometrial mass. Endometrial sampling is recommended to exclude malignancy.  Nonvisualization of the ovaries. Abnormal appearance of the left adnexal region where a sinus tract [place] be present. The specificity of ultrasound in this setting is quite limited. Repeat pelvic CT scanning is recommended  08/03/2017  DIAGNOSIS:  A. VULVA, RIGHT, 9:00 TO CLITORIS; BIOPSY:  - CANDIDIASIS.  - NEGATIVE FOR SQUAMOUS INTRAEPITHELIAL LESION AND MALIGNANCY. She was started on anti-fungal therapy with Monistat and her symptoms improved with 5 days of treatment.   She recently had another episode of LLQ pain and was started on antibiotics for presumed diverticular disease. Her symptoms have  improved. She is scheduled to see Dr. Marlou Starks, [place], this Friday for evaluation.    Gynecologic Oncology History   [name] is a pleasant female who is seen in consultation from Dr. Ilda Basset, referred initially by Dr. Doren Custard for abnormal CT scan with adnexal mass and uterine fluid in the setting of diverticulitis.  The patient was admitted to [facility] from 11/1-11/10 for diverticulitis with adnexal abscess. 11/5 CT scan showed stable 5 cm left adnexal air-fluid structure and fluid again seen in the endometrial canal. She had a JP drain placed. 11/6 cultures showed abundant WBC, predominantly PMNs on gram stain and culture showed citrobacter, rare bacteroides and beta-lactamase +  Serial CT scans:  07/06/2017 CT A/P  IMPRESSION: 1. 4.9 x 4.4 cm left pelvic abscess with the epicenter in the region of the left adnexum. I suspect this is complicated diverticulitis with an abscess involving the left ovary and likely extending into the endometrial cavity. 2. Sigmoid diverticulosis. 3. Remote surgical changes involving the colon without complicating features.   07/11/2017 CT A/P  CT scan abdomen and pelvis: Re- demonstrated air and fluid containing structure centered within the left adnexa with dominant component measuring approximately 5.2 x 3.3 cm, previously, 5.4 x 3.2 cm.   Re- demonstrated fluid seen within the endometrial canal with dominant component measuring approximately 3.3 x 2.5 cm.  IMPRESSION: 1. Grossly unchanged size and appearance of approximately 5.2 cm left adnexal air and fluid collection, incompletely characterized on this noncontrast examination, though again favored to represent secondary involvement of the left adnexa due to adjacent diverticular disease. Given persistence, this structure [place] be amenable to CT-guided  percutaneous aspiration and/or drainage catheter placement as clinically indicated. 2. Fluid is again seen within the endometrial canal, an  abnormal finding in this postmenopausal patient and given suspected infectious process within the left adnexa, concomitant infection of the endometrium is not excluded on the basis this examination. Correlation for vaginal discharge is recommended. Further evaluation with pelvic examination could be performed as indicated. 3. Colonic diverticulosis without evidence of worsening diverticulitis. No new or enlarging fluid collections within the abdomen or pelvis. 4. Post right hemicolectomy without evidence of enteric obstruction. 5. Nodularity hepatic contour suggestive of early cirrhotic change. Correlation LFTs is recommended. 6. Sequela of prior granulomatous disease as above. 7. Aortic Atherosclerosis (ICD10-I70.0).   She saw Dr. Ilda Basset on 07/19/2017 who recommended pelvic US in 4-6 weeks after the 07/19/2017 scan, and CA125. CA125 was 97 and Gyn Oncology referal made.   07/26/2017 CT A/P  IMPRESSION: Percutaneous drainage catheter in the left pelvis remains in position adjacent to the left adnexa, with resolution of the previous abscess. Diverticular disease of the sigmoid colon, with interval reduction in inflammatory changes. Small volume of residual low-density fluid of the endometrium. Differential again includes pyometra, possibly as an extension of infection from the left adnexa. Surgical changes of hemicolectomy. Aortic Atherosclerosis (ICD10-I70.0).  She also had CT scan from 02/26/2017, 03/24/2017, and 04/27/2017. Impression for all were uterus and ovaries were either normal or unremarkable.   Her past medical history is significant for multiple episodes of diverticulitis, right sided colectomy for CRC, breast cancer and CHF   Problem List: Patient Active Problem List   Diagnosis Date Noted  . Uterine mass 09/07/2017  . Adnexal mass 08/03/2017  . Pelvic abscess in female 07/07/2017  . Aortic stenosis   . Dyslipidemia, goal LDL below 70 11/28/2016  . Monitoring for  long-term anticoagulant use 12/31/2015  . Chronic diastolic CHF (congestive heart failure) ([place]) 10/28/2015  . Insomnia 10/23/2015  . Leg swelling 08/11/2015  . Takotsubo syndrome 07/29/2015  . NSTEMI (non-ST elevated myocardial infarction) ([place]) 07/28/2015  . SOB (shortness of breath) 08/15/2014  . Dilated aortic root ([place])   . Pulmonary HTN ([place])   . Permanent atrial fibrillation ([place]) 07/04/2013  . Chronic anticoagulation 07/04/2013  . Coronary artery disease   . Lesion of nipple 05/15/2013  . GERD (gastroesophageal reflux disease)   . Hypertension   . COPD (chronic obstructive pulmonary disease) ([place])   . Osteopenia   . Diverticulosis   . Hiatal hernia   . Abdominal pain 01/11/2012  . Breast cancer of upper-outer quadrant of left female breast ([place]) 01/06/2012  . Colon cancer ([place]) 01/06/2012    Past Medical History: Past Medical History:  Diagnosis Date  . Allergy    rhinitis  . Aortic stenosis    mild by echo 06/2017  . Arthritis   . Breast cancer ([place]) 01/06/2012  . Cancer [facility])    right colon and left breast  . Chronic diastolic CHF (congestive heart failure) ([place])   . Colon cancer ([place]) 01/06/2012  . COPD (chronic obstructive pulmonary disease) ([place])   . Coronary artery disease 2006   nonobstructive with 20-30% ostial LAD and LM  . Dilated aortic root ([place])    66mmHg by echo 06/2017  . Diverticulosis   . Edema extremities   . GERD (gastroesophageal reflux disease)   . Hernia   . Hiatal hernia    denies  . Hyperlipidemia   . Hypertension   . Mild aortic stenosis    echo 11/2015 but not  noted on echo 06/2016  . Osteopenia   . Permanent atrial fibrillation ([facility])    chronic atrial fibrillation  . Pneumonia    hx child  . Pulmonary HTN ([place])    moderate to severe PASP 67mmHg echo 11/2015 - now 87mmHg by echo 06/2017  . Takotsubo syndrome 07/29/2015   a. EF 35-40% by echo; akinesis of mid-apical anteroseptal and apical myocardium.  EF now normalized on echo  11/2015    Past Surgical History: Past Surgical History:  Procedure Laterality Date  . APPENDECTOMY    . BREAST SURGERY     lumpectomy left  . CARDIAC CATHETERIZATION    . CARDIAC CATHETERIZATION N/A 07/28/2015   Procedure: Left Heart Cath and Coronary Angiography;  Surgeon: Peter M Martinique, MD;  Location: [facility];  Service: Cardiovascular;  Laterality: N/A;  . CHOLECYSTECTOMY    . COLECTOMY     right side  . EXCISION OF ACCESSORY NIPPLE Bilateral 05/30/2013   Procedure: BILATERAL NIPPLE BIOPSY;  Surgeon: Merrie Roof, MD;  Location: [facility];  Service: General;  Laterality: Bilateral;  . EYE SURGERY Bilateral 14   cataracts  . IR RADIOLOGIST EVAL & MGMT  07/26/2017  . MASTECTOMY PARTIAL / LUMPECTOMY  2008   left breast  . SPLIT NIGHT STUDY  02/02/2016        Past Gynecologic History:  As per HPI  OB History:  OB History  Gravida Para Term Preterm AB Living  2 1 1   1 1   SAB TAB Ectopic Multiple Live Births  1       1    # Outcome Date GA Lbr Len/2nd Weight Sex Delivery Anes PTL Lv  2 Term      Vag-Spont   LIV  1 SAB      SAB         Family History: Family History  Problem Relation Age of Onset  . Heart disease Mother   . Heart attack Mother   . Cancer Sister        stomach and colon  . Heart disease Brother 4  . Hypertension Father   . Cancer Sister   . Stroke Neg Hx     Social History: Social History   Socioeconomic History  . Marital status: Married    Spouse name: Not on file  . Number of children: Not on file  . Years of education: Not on file  . Highest education level: Not on file  Social Needs  . Financial resource strain: Not on file  . Food insecurity - worry: Not on file  . Food insecurity - inability: Not on file  . Transportation needs - medical: Not on file  . Transportation needs - non-medical: Not on file  Occupational History  . Not on file  Tobacco Use  . Smoking status: Never Smoker  . Smokeless tobacco: Never Used   Substance and Sexual Activity  . Alcohol use: No  . Drug use: No  . Sexual activity: Not Currently  Other Topics Concern  . Not on file  Social History Narrative  . Not on file    Allergies: Allergies  Allergen Reactions  . Contrast Media [Iodinated Diagnostic Agents] Other (See Comments) and Hives    Per pt strong burning sensation starting in chest radiating outward Per pt strong burning sensation starting in chest radiating outward  . Clonidine Derivatives Other (See Comments)    Throat dry  . Statins Other (See Comments)    "  bones hurt"  . Sulfa Antibiotics Diarrhea    Tremors   . Celebrex [Celecoxib] Rash  . Isosorbide Nitrate Itching and Rash  . Other Itching and Rash    Plastic and paper tape and heart monitor pads  . Tape Itching and Rash    Red Where applied and will spread    Current Medications: Current Outpatient Medications  Medication Sig Dispense Refill  . amLODipine (NORVASC) 5 MG tablet TAKE ONE TABLET BY MOUTH EVERY DAY 30 tablet 3  . augmented betamethasone dipropionate (DIPROLENE-AF) 0.05 % ointment Apply 1 application topically 2 (two) times daily. Spot on breast and both legs    . cetirizine (ZYRTEC) 10 MG tablet Take 10 mg by mouth daily as needed for allergies.    . clonazePAM (KLONOPIN) 0.5 MG tablet Take 1 tablet (0.5 mg total) by mouth 2 (two) times daily as needed for anxiety. 60 tablet 1  . cloNIDine (CATAPRES) 0.1 MG tablet TAKE ONE TABLET BY MOUTH TWICE DAILY 60 tablet 11  . losartan (COZAAR) 100 MG tablet TAKE ONE TABLET BY MOUTH EVERY DAY 30 tablet 3  . metoprolol succinate (TOPROL-XL) 50 MG 24 hr tablet TAKE ONE TABLET BY MOUTH ONCE DAILY TAKE  WITH  OR  FOLLOWING  A  MEAL 90 tablet 3  . mirtazapine (REMERON) 15 MG tablet TAKE 1 TABLET BY MOUTH AT BEDTIME 30 tablet 2  . montelukast (SINGULAIR) 10 MG tablet Take 1 tablet (10 mg total) by mouth at bedtime. (Patient taking differently: Take 10 mg by mouth at bedtime as needed (for allergies).  ) 30 tablet 3  . nitroGLYCERIN (NITROSTAT) 0.4 MG SL tablet PLACE ONE TABLET UNDER THE TONGUE EVERY FIVE MINUTES AS NEEDED FOR CHEST PAIN. [place] REPEAT FOR THREE DOSES. 30 tablet 2  . omeprazole (PRILOSEC) 20 MG capsule Take 1 capsule (20 mg total) by mouth daily as needed (heartburn, acid reflux). (Patient taking differently: Take 20 mg by mouth 2 (two) times daily before a meal. ) 90 capsule 3  . OVER THE COUNTER MEDICATION Place 1 drop into both eyes daily as needed (dry eyes). Eye drops    . spironolactone (ALDACTONE) 25 MG tablet TAKE ONE-HALF TABLET BY MOUTH ONCE DAILY 45 tablet 3  . torsemide (DEMADEX) 20 MG tablet Take 60 mg by mouth daily.     Marland Kitchen warfarin (COUMADIN) 1 MG tablet Take 0.5 tablets (0.5 mg total) by mouth as directed. M-W-F-SA (Patient taking differently: Take 3 mg by mouth See admin instructions. Mon/Wed/Fri/Sat) 90 tablet 2  . warfarin (COUMADIN) 5 MG tablet Take 0.5 tablets (2.5 mg total) by mouth See admin instructions. Take half a tablet (2.5 mg) on Tuesdays, Thursdays, and Sundays. Take half a tablet with half a tablet of 1 mg (total 3 mg) on all other days. (Patient taking differently: Take 2.5 mg by mouth See admin instructions. Take 1/2 tablet  on Tuesdays, Thursdays, and Sundays.) 90 tablet 2  . acetaminophen (TYLENOL) 325 MG tablet Take 650 mg by mouth every 6 (six) hours as needed for mild pain. Reported on 03/15/2016    . amoxicillin-clavulanate (AUGMENTIN) 875-125 MG tablet Take 1 tablet by mouth 2 (two) times daily. 14 tablet 0  . HYDROcodone-acetaminophen (NORCO) 7.5-325 MG tablet Take 1 tablet every 6 (six) hours as needed by mouth for severe pain. 30 tablet 0  . triamcinolone cream (KENALOG) 0.1 % Apply 1 application topically 2 (two) times daily. 30 g 0   No current facility-administered medications for this visit.  Review of Systems General: fatigue, decreased appetite  HEENT: previously ringing in the ears  Lungs: dyspnea  Cardiac: chest pain  GI:  abdominal pain LLQ  GU: pelvic pain; h/o incomplete emptying of the bladder; no vaginal bleeding or discharge. Vulvar irritation.   Musculoskeletal: back pain  Extremities: no complaints  Skin: no complaints  Neuro: no complaints  Endocrine: no complaints  Psych: no complaints    Heme: bruising   Objective:  Physical Examination:  BP (!) 157/77   Pulse 65   Temp 97.6 F (36.4 C) (Tympanic)   Resp 18   Ht 5\' 6"  (1.676 m)   Wt 201 lb 3.2 oz (91.3 kg)   BMI 32.47 kg/m    ECOG Performance Status: 1 - Symptomatic but completely ambulatory  She deferred the exam. She does not want to repeat the pelvic exam. She declines endometrial biopsy.   Pelvic: at her consult visit exam chaperoned by nurse;  Vulva: normal appearing vulva except for periclitoral area of leukoplakia. Nontender. No other lesions; Vagina: normal; Adnexa: limited exam due to habitus, no masses detected; Uterus:  limited exam due to habitus, nontender; Cervix: no lesions; Rectal: confirmatory.   Lab Review CA125 pending  Radiologic Imaging: As noted in HPI    Assessment:  [name] is a 83 y.o. female diagnosed with adnexal mass and uterine fluid in the setting of diverticulitis. Suspect findings due to infectious sequela. Elevated CA125 is nonspecific and Ber[place] be due infection/diverticulitis. Today CA125 pending. Ultrasound reassuring given lack of adnexal mass and suspect findings are consistent with diverticulitis.   Intrauterine mass and uterine fluid, asymptomatic: DDX includes hematometra, benign endometrial polyp/fibroid versus malignancy.   Vulvar candidiasis.   Persistent diverticular symptoms that are refractory to antibiotic therapy.      Medical co-morbidities complicating care: h/o mild Aortic stenosis; h/o breast cancer/colon cancer; CHF; COPD; CAD; dilated aortic root; GERD; HTN; atrial fibrillation ([place]); Pulmonary HTN ([place]) - moderate to severe PASP 59mmHg echo 11/2015 - now 36mmHg by echo  06/2017; h/o Takotsubo syndrome; and s/p multiple laparotomies.    Plan:   Problem List Items Addressed This Visit      Other   Uterine mass - Primary    Other Visit Diagnoses    Yeast vaginitis         Follow up CA125 today. I reviewed imaging with the patient and her husband. I explained rational for the endometrial biopsy versus undergoing endometrial assessment at the time of colectomy if she decides she would like to undergo surgical management for diverticular disease.   She is scheduled to see Dr. Marlou Starks this Friday - I spoke with him today. If she does not need surgery we will have her return to clinic in ~  4 weeks for discussion of EMBx vs EUA, D&C, hysteroscopy and resection of endometrial mass.    Repeat Monistat therapy. Suspect candidiasis exacerbation due to reinitiation of antibiotic therapy.   The patient's diagnosis, an outline of the further diagnostic and laboratory studies which will be required, the recommendation, and alternatives were discussed.  All questions were answered to the patient's satisfaction.  Marland Kitchen  Gillis Ends, MD    CC:  Referring Provider: Aletha Halim, MD  Primary Care Provider:  Orlena Sheldon, ([place])

## 2017-09-07 ENCOUNTER — Inpatient Hospital Stay: Payer: Medicare Other

## 2017-09-07 ENCOUNTER — Inpatient Hospital Stay: Payer: Medicare Other | Attending: Obstetrics and Gynecology | Admitting: Obstetrics and Gynecology

## 2017-09-07 VITALS — BP 157/77 | HR 65 | Temp 97.6°F | Resp 18 | Ht 66.0 in | Wt 201.2 lb

## 2017-09-07 DIAGNOSIS — R971 Elevated cancer antigen 125 [CA 125]: Secondary | ICD-10-CM | POA: Diagnosis not present

## 2017-09-07 DIAGNOSIS — N9489 Other specified conditions associated with female genital organs and menstrual cycle: Secondary | ICD-10-CM

## 2017-09-07 DIAGNOSIS — B3731 Acute candidiasis of vulva and vagina: Secondary | ICD-10-CM

## 2017-09-07 DIAGNOSIS — I700 Atherosclerosis of aorta: Secondary | ICD-10-CM

## 2017-09-07 DIAGNOSIS — K219 Gastro-esophageal reflux disease without esophagitis: Secondary | ICD-10-CM | POA: Diagnosis not present

## 2017-09-07 DIAGNOSIS — I350 Nonrheumatic aortic (valve) stenosis: Secondary | ICD-10-CM | POA: Insufficient documentation

## 2017-09-07 DIAGNOSIS — I110 Hypertensive heart disease with heart failure: Secondary | ICD-10-CM | POA: Insufficient documentation

## 2017-09-07 DIAGNOSIS — N858 Other specified noninflammatory disorders of uterus: Secondary | ICD-10-CM | POA: Insufficient documentation

## 2017-09-07 DIAGNOSIS — I272 Pulmonary hypertension, unspecified: Secondary | ICD-10-CM | POA: Diagnosis not present

## 2017-09-07 DIAGNOSIS — B373 Candidiasis of vulva and vagina: Secondary | ICD-10-CM | POA: Insufficient documentation

## 2017-09-07 DIAGNOSIS — E785 Hyperlipidemia, unspecified: Secondary | ICD-10-CM | POA: Insufficient documentation

## 2017-09-07 DIAGNOSIS — Z85038 Personal history of other malignant neoplasm of large intestine: Secondary | ICD-10-CM | POA: Insufficient documentation

## 2017-09-07 DIAGNOSIS — Z7901 Long term (current) use of anticoagulants: Secondary | ICD-10-CM | POA: Diagnosis not present

## 2017-09-07 DIAGNOSIS — I482 Chronic atrial fibrillation: Secondary | ICD-10-CM | POA: Diagnosis not present

## 2017-09-07 DIAGNOSIS — J449 Chronic obstructive pulmonary disease, unspecified: Secondary | ICD-10-CM | POA: Diagnosis not present

## 2017-09-07 DIAGNOSIS — Z79899 Other long term (current) drug therapy: Secondary | ICD-10-CM | POA: Diagnosis not present

## 2017-09-07 DIAGNOSIS — K5730 Diverticulosis of large intestine without perforation or abscess without bleeding: Secondary | ICD-10-CM | POA: Insufficient documentation

## 2017-09-07 DIAGNOSIS — I5032 Chronic diastolic (congestive) heart failure: Secondary | ICD-10-CM | POA: Diagnosis not present

## 2017-09-07 NOTE — Progress Notes (Signed)
Stomach pain specific LLL  Of abdomen at times. She has an appt Friday to see surgeon dr. Marlou Starks At [facility].  Pt has itching at vaginal area- this is not new.

## 2017-09-08 ENCOUNTER — Other Ambulatory Visit: Payer: Self-pay | Admitting: Physician Assistant

## 2017-09-08 LAB — CA 125: Cancer Antigen (CA) 125: 52 U/mL — ABNORMAL HIGH (ref 0.0–38.1)

## 2017-09-09 ENCOUNTER — Telehealth: Payer: Self-pay

## 2017-09-09 DIAGNOSIS — K5732 Diverticulitis of large intestine without perforation or abscess without bleeding: Secondary | ICD-10-CM | POA: Diagnosis not present

## 2017-09-09 NOTE — Telephone Encounter (Signed)
-----   Message from [place], MD sent at 09/08/2017  4:51 PM EST ----- Steffanie Dunn can you please let her know about her CA125 - good news it has decreased.  Thank you Sincerely [place] ----- Message ----- From: Interface, Lab In [place] Sent: 09/08/2017   3:37 AM To: Gillis Ends, MD

## 2017-09-09 NOTE — Telephone Encounter (Signed)
Notified of CA 125 results decreasing. Keep follow up appointments as scheduled.   Ref Range & Units2d ago Cancer Antigen (CA) 1250.0 - 38.1 U/mL     52.0 Abnormally high    Oncology Nurse Navigator Documentation  Navigator Location: [facility] (09/09/17 0900)   )Navigator Encounter Type: Telephone (09/09/17 0900) Telephone: Outgoing Call (09/09/17 0900)                                                  Time Spent with Patient: 15 (09/09/17 0900)

## 2017-09-13 DIAGNOSIS — Z7901 Long term (current) use of anticoagulants: Secondary | ICD-10-CM | POA: Insufficient documentation

## 2017-09-13 DIAGNOSIS — C50919 Malignant neoplasm of unspecified site of unspecified female breast: Secondary | ICD-10-CM | POA: Insufficient documentation

## 2017-09-13 DIAGNOSIS — E669 Obesity, unspecified: Secondary | ICD-10-CM | POA: Insufficient documentation

## 2017-09-14 ENCOUNTER — Ambulatory Visit: Payer: Medicare Other

## 2017-09-14 ENCOUNTER — Ambulatory Visit (INDEPENDENT_AMBULATORY_CARE_PROVIDER_SITE_OTHER): Payer: Medicare Other

## 2017-09-14 ENCOUNTER — Other Ambulatory Visit: Payer: Medicare Other

## 2017-09-14 ENCOUNTER — Ambulatory Visit: Payer: Medicare Other | Admitting: Podiatry

## 2017-09-14 ENCOUNTER — Encounter: Payer: Self-pay | Admitting: Podiatry

## 2017-09-14 ENCOUNTER — Other Ambulatory Visit: Payer: Self-pay | Admitting: General Surgery

## 2017-09-14 DIAGNOSIS — M722 Plantar fascial fibromatosis: Secondary | ICD-10-CM | POA: Diagnosis not present

## 2017-09-14 DIAGNOSIS — M7750 Other enthesopathy of unspecified foot: Secondary | ICD-10-CM | POA: Diagnosis not present

## 2017-09-14 DIAGNOSIS — B353 Tinea pedis: Secondary | ICD-10-CM | POA: Diagnosis not present

## 2017-09-14 DIAGNOSIS — K5732 Diverticulitis of large intestine without perforation or abscess without bleeding: Secondary | ICD-10-CM

## 2017-09-14 MED ORDER — KETOCONAZOLE 2 % EX CREA: TOPICAL_CREAM | CUTANEOUS | 0 refills | Status: DC

## 2017-09-14 NOTE — Progress Notes (Signed)
Subjective:  Patient ID: [name], female    DOB: June 24, 1935,  MRN: [id number] HPI Chief Complaint  Patient presents with  . Foot Pain    Patient presents today for right foot pain, numbness, tingling x several weeks.  She states that her left toes also have a burning sensation which both feet are worse at night.  Her right foot is very painful when first standing up in the morning.  She has been stretching her foot and soaking in Epson salt with no relief    83 y.o. female presents with the above complaint.     Past Medical History:  Diagnosis Date  . Allergy    rhinitis  . Aortic stenosis    mild by echo 06/2017  . Arthritis   . Breast cancer ([place]) 01/06/2012  . Cancer [facility])    right colon and left breast  . Chronic diastolic CHF (congestive heart failure) ([place])   . Colon cancer ([place]) 01/06/2012  . COPD (chronic obstructive pulmonary disease) ([place])   . Coronary artery disease 2006   nonobstructive with 20-30% ostial LAD and LM  . Dilated aortic root ([place])    30mmHg by echo 06/2017  . Diverticulosis   . Edema extremities   . GERD (gastroesophageal reflux disease)   . Hernia   . Hiatal hernia    denies  . Hyperlipidemia   . Hypertension   . Mild aortic stenosis    echo 11/2015 but not noted on echo 06/2016  . Osteopenia   . Permanent atrial fibrillation ([facility])    chronic atrial fibrillation  . Pneumonia    hx child  . Pulmonary HTN ([place])    moderate to severe PASP 34mmHg echo 11/2015 - now 60mmHg by echo 06/2017  . Takotsubo syndrome 07/29/2015   a. EF 35-40% by echo; akinesis of mid-apical anteroseptal and apical myocardium.  EF now normalized on echo 11/2015   Past Surgical History:  Procedure Laterality Date  . APPENDECTOMY    . BREAST SURGERY     lumpectomy left  . CARDIAC CATHETERIZATION    . CARDIAC CATHETERIZATION N/A 07/28/2015   Procedure: Left Heart Cath and Coronary Angiography;  Surgeon: Peter M Martinique, MD;  Location: [facility];  Service:  Cardiovascular;  Laterality: N/A;  . CHOLECYSTECTOMY    . COLECTOMY     right side  . EXCISION OF ACCESSORY NIPPLE Bilateral 05/30/2013   Procedure: BILATERAL NIPPLE BIOPSY;  Surgeon: Merrie Roof, MD;  Location: [place];  Service: General;  Laterality: Bilateral;  . EYE SURGERY Bilateral 12   cataracts  . IR RADIOLOGIST EVAL & MGMT  07/26/2017  . MASTECTOMY PARTIAL / LUMPECTOMY  2008   left breast  . SPLIT NIGHT STUDY  02/02/2016        Current Outpatient Medications:  .  acetaminophen (TYLENOL) 325 MG tablet, Take 650 mg by mouth every 6 (six) hours as needed for mild pain. Reported on 03/15/2016, Disp: , Rfl:  .  amLODipine (NORVASC) 5 MG tablet, TAKE ONE TABLET BY MOUTH EVERY DAY, Disp: 30 tablet, Rfl: 3 .  augmented betamethasone dipropionate (DIPROLENE-AF) 0.05 % ointment, Apply 1 application topically 2 (two) times daily. Spot on breast and both legs, Disp: , Rfl:  .  cetirizine (ZYRTEC) 10 MG tablet, Take 10 mg by mouth daily as needed for allergies., Disp: , Rfl:  .  clonazePAM (KLONOPIN) 0.5 MG tablet, Take 1 tablet (0.5 mg total) by mouth 2 (two) times daily as needed  for anxiety., Disp: 60 tablet, Rfl: 1 .  cloNIDine (CATAPRES) 0.1 MG tablet, TAKE ONE TABLET BY MOUTH TWICE DAILY, Disp: 60 tablet, Rfl: 11 .  HYDROcodone-acetaminophen (NORCO) 7.5-325 MG tablet, Take 1 tablet every 6 (six) hours as needed by mouth for severe pain., Disp: 30 tablet, Rfl: 0 .  losartan (COZAAR) 100 MG tablet, TAKE ONE TABLET BY MOUTH EVERY DAY, Disp: 30 tablet, Rfl: 3 .  metoprolol succinate (TOPROL-XL) 50 MG 24 hr tablet, TAKE ONE TABLET BY MOUTH ONCE DAILY TAKE  WITH  OR  FOLLOWING  A  MEAL, Disp: 90 tablet, Rfl: 3 .  mirtazapine (REMERON) 15 MG tablet, TAKE 1 TABLET BY MOUTH AT BEDTIME, Disp: 30 tablet, Rfl: 2 .  montelukast (SINGULAIR) 10 MG tablet, Take 1 tablet (10 mg total) by mouth at bedtime. (Patient taking differently: Take 10 mg by mouth at bedtime as needed (for allergies). ), Disp: 30  tablet, Rfl: 3 .  nitroGLYCERIN (NITROSTAT) 0.4 MG SL tablet, PLACE ONE TABLET UNDER THE TONGUE EVERY FIVE MINUTES AS NEEDED FOR CHEST PAIN. [place] REPEAT FOR THREE DOSES., Disp: 30 tablet, Rfl: 2 .  omeprazole (PRILOSEC) 20 MG capsule, Take 1 capsule (20 mg total) by mouth daily as needed (heartburn, acid reflux). (Patient taking differently: Take 20 mg by mouth 2 (two) times daily before a meal. ), Disp: 90 capsule, Rfl: 3 .  OVER THE COUNTER MEDICATION, Place 1 drop into both eyes daily as needed (dry eyes). Eye drops, Disp: , Rfl:  .  spironolactone (ALDACTONE) 25 MG tablet, TAKE ONE-HALF TABLET BY MOUTH ONCE DAILY, Disp: 45 tablet, Rfl: 3 .  torsemide (DEMADEX) 20 MG tablet, Take 60 mg by mouth daily. , Disp: , Rfl:  .  triamcinolone cream (KENALOG) 0.1 %, Apply 1 application topically 2 (two) times daily., Disp: 30 g, Rfl: 0 .  warfarin (COUMADIN) 1 MG tablet, Take 0.5 tablets (0.5 mg total) by mouth as directed. M-W-F-SA (Patient taking differently: Take 3 mg by mouth See admin instructions. Mon/Wed/Fri/Sat), Disp: 90 tablet, Rfl: 2 .  warfarin (COUMADIN) 5 MG tablet, TAKE ONE-HALF TABLET (2.5MG ) BY MOUTH DAILY. (ON MONDAY, WEDNESDAY, FRIDAY ANDSATURDAY, ADD ONE-HALF TABLET OF WARFARIN 1MG )., Disp: 90 tablet, Rfl: 2  Allergies  Allergen Reactions  . Contrast Media [Iodinated Diagnostic Agents] Other (See Comments) and Hives    Per pt strong burning sensation starting in chest radiating outward Per pt strong burning sensation starting in chest radiating outward  . Clonidine Derivatives Other (See Comments)    Throat dry  . Statins Other (See Comments)    "bones hurt"  . Sulfa Antibiotics Diarrhea    Tremors   . Celebrex [Celecoxib] Rash  . Isosorbide Nitrate Itching and Rash  . Other Itching and Rash    Plastic and paper tape and heart monitor pads  . Tape Itching and Rash    Red Where applied and will spread   Review of Systems  Musculoskeletal: Positive for arthralgias and gait  problem.  All other systems reviewed and are negative.  Objective:  There were no vitals filed for this visit.  General: Well developed, nourished, in no acute distress, alert and oriented x3   Dermatological: Skin is warm, dry and supple bilateral. Nails x 10 are well maintained; remaining integument appears unremarkable at this time. There are no open sores, no preulcerative lesions, no rash or signs of infection present.  Tinea pedis interdigital second and third digits left foot  Vascular: Dorsalis Pedis artery and Posterior Tibial artery  pedal pulses are 2/4 bilateral with immedate capillary fill time. Pedal hair growth present. No varicosities and no lower extremity edema present bilateral.   Neruologic: Grossly intact via light touch bilateral. Vibratory intact via tuning fork bilateral. Protective threshold with Semmes Wienstein monofilament intact to all pedal sites bilateral. Patellar and Achilles deep tendon reflexes 2+ bilateral. No Babinski or clonus noted bilateral.  Palpable Mulder's click second and third digits left foot.  Musculoskeletal: No gross boney pedal deformities bilateral. No pain, crepitus, or limitation noted with foot and ankle range of motion bilateral. Muscular strength 5/5 in all groups tested bilateral.  Pain on palpation medial continue tubercles of the right heel. Gait: Unassisted, Nonantalgic.    Radiographs:  3 views of the right foot was taken today demonstrating osteoarthritic changes to the midfoot forefoot.  She also has plantar distally oriented calcaneal heel spur with soft tissue increase in density at the plantar fascial calcaneal insertion sites.  Assessment & Plan:   Assessment: Plantar fasciitis right heel.  Tinea pedis with hammertoe deformities left foot probable neuroma left foot.  Plan: We discussed etiology pathology conservative versus surgical therapies.  At this point I injected her right heel today with Kenalog and local anesthetic  20 mg and 5 mg respectively after sterile Betadine skin prep and verbal permission.  She tolerated procedure well without complication we injected it into the medial aspect of the right heel at the plantar fascial calcaneal insertion site.  Also wrote a prescription for ketoconazole for her athlete's foot and placed her in plantar fascial brace.  Discussed etiology pathology conservative versus surgical therapies.  The next time she comes in we will probably give her an injection to the second and third interdigital space of the left foot.     Celinda Dethlefs T. Pease, [place]

## 2017-09-20 ENCOUNTER — Ambulatory Visit
Admission: RE | Admit: 2017-09-20 | Discharge: 2017-09-20 | Disposition: A | Discharge status: Home / Self Care | Payer: Medicare Other | Source: Ambulatory Visit | Attending: General Surgery | Admitting: General Surgery

## 2017-09-20 DIAGNOSIS — K5732 Diverticulitis of large intestine without perforation or abscess without bleeding: Secondary | ICD-10-CM

## 2017-09-20 DIAGNOSIS — K5730 Diverticulosis of large intestine without perforation or abscess without bleeding: Secondary | ICD-10-CM | POA: Diagnosis not present

## 2017-09-24 ENCOUNTER — Other Ambulatory Visit: Payer: Self-pay | Admitting: Physician Assistant

## 2017-09-24 DIAGNOSIS — F064 Anxiety disorder due to known physiological condition: Secondary | ICD-10-CM

## 2017-09-24 DIAGNOSIS — K5792 Diverticulitis of intestine, part unspecified, without perforation or abscess without bleeding: Secondary | ICD-10-CM

## 2017-09-26 NOTE — Telephone Encounter (Signed)
Approved # [phone] [id number] 2 

## 2017-09-26 NOTE — Telephone Encounter (Signed)
Last refill 04/04/2017 Okay to refill?

## 2017-09-26 NOTE — Telephone Encounter (Signed)
rx called into pharmacy

## 2017-09-28 DIAGNOSIS — K5732 Diverticulitis of large intestine without perforation or abscess without bleeding: Secondary | ICD-10-CM | POA: Diagnosis not present

## 2017-09-28 NOTE — Progress Notes (Signed)
ADDENDUM: Per Dr. Marlou Starks  "I saw [name] today and just wanted to keep you up to date. She apparently felt ill over the Christmas holiday and was treated with abx but it is not clear what she was treated for. She feels good today. She is still pretty resolute about no wanting surgery for the colon or the endometrial biopsy. My plan will be to repeat her CT scan. If there is no recurrence of the fluid collection and no inflammation then she still wants to hold off on surgery. That would mean you could go ahead with an endometrial biopsy although I don't think she will agree to that. If her scan shows recurrence of the diverticulitis then we will consider surgery and you can address the uterus and adnexa during the operation. Does that sound Ok to you? I will keep you up to date. Thanks. PJ Marlou Starks"  She is scheduled to see [place] on 10/19/2017 and we can discuss endometrial biopsy vs follow up ultrasound with her at that time.  Gillis Ends, MD

## 2017-09-29 ENCOUNTER — Telehealth: Payer: Self-pay

## 2017-09-29 NOTE — Telephone Encounter (Signed)
   Primary Cardiologist:   Chart reviewed and pt called as part of pre-operative protocol coverage. Because of [name]'s past medical history and my phone call with her today (she complained of recent chest pain) she will require a follow-up visit in order to better assess preoperative cardiovascular risk.  Pre-op covering staff: - Please schedule appointment and call patient to inform them. - Please contact requesting surgeon's office via preferred method (i.e, phone, fax) to inform them of need for appointment prior to surgery.  Kerin Ransom, PA-C  09/29/2017, 2:01 PM

## 2017-09-29 NOTE — Telephone Encounter (Signed)
Notification faxed to requesting office via Epic.   Sent to scheduling to arrange f/u appt.

## 2017-09-29 NOTE — Telephone Encounter (Signed)
   [facility] Pre-operative Risk Assessment    Request for surgical clearance:  1. What type of surgery is being performed? colon   2. When is this surgery scheduled? n/a   3. What type of clearance is required (medical clearance vs. Pharmacy clearance to hold med vs. Both)? both  4. Are there any medications that need to be held prior to surgery and how long?Warfarin 5 days   5. Practice name and name of physician performing surgery? [facility]   6. What is your office phone and fax number? [phone]   7. Anesthesia type (None, local, MAC, general) ? unknown   [name] 09/29/2017, 1:10 PM  _________________________________________________________________   (provider comments below)

## 2017-10-06 ENCOUNTER — Other Ambulatory Visit: Payer: Self-pay | Admitting: Physician Assistant

## 2017-10-06 NOTE — Telephone Encounter (Signed)
Last OV 08/26/2017 Last refill 07/16/2017 Okay to refill?

## 2017-10-06 NOTE — Telephone Encounter (Signed)
Pt requesting refill on hydrocodone, [facility].

## 2017-10-07 MED ORDER — HYDROCODONE-ACETAMINOPHEN 7.5-325 MG PO TABS: 1.0000 | ORAL_TABLET | Freq: Four times a day (QID) | ORAL | 0 refills | Status: DC | PRN

## 2017-10-12 ENCOUNTER — Encounter: Payer: Self-pay | Admitting: Podiatry

## 2017-10-12 ENCOUNTER — Ambulatory Visit: Payer: Medicare Other | Admitting: Podiatry

## 2017-10-12 DIAGNOSIS — G5782 Other specified mononeuropathies of left lower limb: Secondary | ICD-10-CM

## 2017-10-12 DIAGNOSIS — G5762 Lesion of plantar nerve, left lower limb: Secondary | ICD-10-CM

## 2017-10-12 NOTE — Progress Notes (Signed)
She presents today for follow-up of her bilateral feet.  She states that my right foot is doing great from a left toes are painful.  She states that they start bothering her about 2-3 and a o'clock in the morning.  She says it feels like to start sharp stabbing pain.  Objective: Signs are stable she is alert and oriented x3 ulcers are palpable.  Hammertoe deformities bilateral are noted.  She has palpable Mulder's click to the second and third interdigital space of the left foot.  She has no pain on palpation medial calcaneal tubercle of the right heel any longer.  Assessment: Pain in limb secondary neuroma second and third interdigital space left foot.  Resolution of fasciitis right foot.  Plan: Discussed etiology pathology conservative versus surgical therapies.  Injected the second and third interdigital space today with Kenalog and local anesthetic 20 mg of Kenalog 5 mg of Marcaine each interdigital space.  On follow-up with her in 3-4 weeks.

## 2017-10-18 ENCOUNTER — Telehealth: Payer: Self-pay

## 2017-10-18 NOTE — Telephone Encounter (Signed)
Called to confirm appointment with Ms. Gleghorn. She states she cancelled her appointment via the automated confirm system. Encouraged to reschedule to follow up on adnexal mass. She stated at this time she just want to "see what will be". States she is still seeing Dr. Jene Every and he Volkov get another "woman" to do surgery with him. She will not reschedule. Made her aware that she can call at any time to reschedule. Oncology Nurse Navigator Documentation  Navigator Location: [facility] (10/18/17 1500)   )Navigator Encounter Type: Telephone (10/18/17 1500) Telephone: Lahoma Crocker Call;Appt Confirmation/Clarification (10/18/17 1500)                                                  Time Spent with Patient: 15 (10/18/17 1500)

## 2017-10-19 ENCOUNTER — Inpatient Hospital Stay: Payer: Medicare Other

## 2017-10-20 ENCOUNTER — Other Ambulatory Visit: Payer: Self-pay

## 2017-10-20 ENCOUNTER — Ambulatory Visit: Payer: Medicare Other | Admitting: Physician Assistant

## 2017-10-20 ENCOUNTER — Encounter: Payer: Self-pay | Admitting: Physician Assistant

## 2017-10-20 VITALS — BP 134/58 | HR 62 | Temp 98.3°F | Resp 14 | Wt 200.4 lb

## 2017-10-20 DIAGNOSIS — N760 Acute vaginitis: Secondary | ICD-10-CM

## 2017-10-20 DIAGNOSIS — B373 Candidiasis of vulva and vagina: Secondary | ICD-10-CM | POA: Diagnosis not present

## 2017-10-20 DIAGNOSIS — B3731 Acute candidiasis of vulva and vagina: Secondary | ICD-10-CM

## 2017-10-20 LAB — WET PREP FOR TRICH, YEAST, CLUE

## 2017-10-20 MED ORDER — FLUCONAZOLE 150 MG PO TABS: 150.0000 mg | ORAL_TABLET | Freq: Once | ORAL | 0 refills | Status: AC

## 2017-10-20 NOTE — Progress Notes (Signed)
Patient ID: [name] MRN: [id number], DOB: 1935/06/19, 83 y.o. Date of Encounter: 10/20/2017, 9:11 AM    Chief Complaint:  Chief Complaint  Patient presents with  . itching in vaginal area     HPI: 83 y.o. year old female reports above.   She reports that she has been having some itching and irritation in her vaginal area.  Has been using some over-the-counter treatments with no relief.     Home Meds:   Outpatient Medications Prior to Visit  Medication Sig Dispense Refill  . acetaminophen (TYLENOL) 325 MG tablet Take 650 mg by mouth every 6 (six) hours as needed for mild pain. Reported on 03/15/2016    . amLODipine (NORVASC) 5 MG tablet TAKE ONE TABLET BY MOUTH EVERY DAY 30 tablet 3  . augmented betamethasone dipropionate (DIPROLENE-AF) 0.05 % ointment Apply 1 application topically 2 (two) times daily. Spot on breast and both legs    . cetirizine (ZYRTEC) 10 MG tablet Take 10 mg by mouth daily as needed for allergies.    . clonazePAM (KLONOPIN) 0.5 MG tablet TAKE 1 TABLET BY MOUTH TWICE DAILY AS NEEDED FOR ANXIETY 60 tablet 2  . cloNIDine (CATAPRES) 0.1 MG tablet TAKE ONE TABLET BY MOUTH TWICE DAILY 60 tablet 11  . HYDROcodone-acetaminophen (NORCO) 7.5-325 MG tablet Take 1 tablet by mouth every 6 (six) hours as needed for severe pain. 30 tablet 0  . ketoconazole (NIZORAL) 2 % cream Apply to affected area twice daily. 15 g 0  . losartan (COZAAR) 100 MG tablet TAKE ONE TABLET BY MOUTH EVERY DAY 30 tablet 3  . metoprolol succinate (TOPROL-XL) 50 MG 24 hr tablet TAKE ONE TABLET BY MOUTH ONCE DAILY TAKE  WITH  OR  FOLLOWING  A  MEAL 90 tablet 3  . mirtazapine (REMERON) 15 MG tablet TAKE 1 TABLET BY MOUTH AT BEDTIME 30 tablet 2  . montelukast (SINGULAIR) 10 MG tablet Take 1 tablet (10 mg total) by mouth at bedtime. (Patient taking differently: Take 10 mg by mouth at bedtime as needed (for allergies). ) 30 tablet 3  . nitroGLYCERIN (NITROSTAT) 0.4 MG SL tablet PLACE ONE TABLET UNDER THE  TONGUE EVERY FIVE MINUTES AS NEEDED FOR CHEST PAIN. [place] REPEAT FOR THREE DOSES. 30 tablet 2  . omeprazole (PRILOSEC) 20 MG capsule Take 1 capsule (20 mg total) by mouth daily as needed (heartburn, acid reflux). (Patient taking differently: Take 20 mg by mouth 2 (two) times daily before a meal. ) 90 capsule 3  . OVER THE COUNTER MEDICATION Place 1 drop into both eyes daily as needed (dry eyes). Eye drops    . spironolactone (ALDACTONE) 25 MG tablet TAKE ONE-HALF TABLET BY MOUTH ONCE DAILY 45 tablet 3  . torsemide (DEMADEX) 20 MG tablet Take 60 mg by mouth daily.     Marland Kitchen triamcinolone cream (KENALOG) 0.1 % Apply 1 application topically 2 (two) times daily. 30 g 0  . warfarin (COUMADIN) 1 MG tablet Take 0.5 tablets (0.5 mg total) by mouth as directed. M-W-F-SA (Patient taking differently: Take 3 mg by mouth See admin instructions. Mon/Wed/Fri/Sat) 90 tablet 2  . warfarin (COUMADIN) 5 MG tablet TAKE ONE-HALF TABLET (2.5MG ) BY MOUTH DAILY. (ON MONDAY, WEDNESDAY, FRIDAY ANDSATURDAY, ADD ONE-HALF TABLET OF WARFARIN 1MG ). 90 tablet 2   No facility-administered medications prior to visit.     Allergies:  Allergies  Allergen Reactions  . Contrast Media [Iodinated Diagnostic Agents] Other (See Comments) and Hives    Per pt strong burning  sensation starting in chest radiating outward Per pt strong burning sensation starting in chest radiating outward  . Clonidine Derivatives Other (See Comments)    Throat dry  . Statins Other (See Comments)    "bones hurt"  . Sulfa Antibiotics Diarrhea    Tremors   . Celebrex [Celecoxib] Rash  . Isosorbide Nitrate Itching and Rash  . Other Itching and Rash    Plastic and paper tape and heart monitor pads  . Tape Itching and Rash    Red Where applied and will spread      Review of Systems: See HPI for pertinent ROS. All other ROS negative.    Physical Exam: Blood pressure (!) 134/58, pulse 62, temperature 98.3 F (36.8 C), temperature source Oral, resp. rate  14, weight 90.9 kg (200 lb 6.4 oz), SpO2 98 %., Body mass index is 32.35 kg/m. General:  Obese WF. Appears in no acute distress. Neck: Supple. No thyromegaly. No lymphadenopathy. Lungs: Clear bilaterally to auscultation without wheezes, rales, or rhonchi. Breathing is unlabored. Heart: Irregular rhythm.  Msk:  Strength and tone normal for age. Pelvic Exam: External Genitalia: Medial aspect of vulva---diffuse erythema. Swab used to perform Wet Prep.  Speculum exam deferred given age 83. Extremities/Skin: Warm and dry.  Neuro: Alert and oriented X 3. Moves all extremities spontaneously. Gait is normal. CNII-XII grossly in tact. Psych:  Responds to questions appropriately with a normal affect.     ASSESSMENT AND PLAN:  83 y.o. year old female with  1. Vaginitis and vulvovaginitis - WET PREP FOR TRICH, YEAST, CLUE  2. Vaginal candidiasis Unfortunately the wet prep did not give [place] any answers.  Did not see yeast or clue cells.  Physical exam is more consistent with a yeast as there was diffuse erythema on the skin but very little/no discharge present.  She is to take the one-time Diflucan.  If symptoms do not resolve with this then follow-up. - fluconazole (DIFLUCAN) 150 MG tablet; Take 1 tablet (150 mg total) by mouth once for 1 dose.  Dispense: 1 tablet; Refill: 0   Signed, [address] [place], [place], [facility] 10/20/2017 9:11 AM

## 2017-10-24 ENCOUNTER — Other Ambulatory Visit: Payer: Self-pay | Admitting: Physician Assistant

## 2017-10-24 NOTE — Progress Notes (Addendum)
Cardiology Office Note:    Date:  10/25/2017   ID:  [name], DOB [birth year], MRN [id number]  PCP:  Susan Sheldon, PA-C  Cardiologist:  Susan Carnes, MD   Referring MD: Susan Sheldon, PA-C   Chief Complaint  Patient presents with  . Medical Clearance    surgery    History of Present Illness:    [name] is a 83 y.o. female with a hx of chronic diastolic heart failure, aortic stenosis, CAD s/p NSTEMI with history of takotsubo by cath 07/28/2015 and reduced LVEF of 35-40%,  permanent Afib on coumadin, COPD, and HTN. Repeat echo 11/17/15 with improved EF to 60-65%, but elevated PA systolic pressure.  Myoview in 12/2015 with 2 small areas of defect (anteroseptal and apical lateral areas) thought to be breast attenuation, read as low risk.    She was last seen in clinic on  05/30/17 with Dr. [name]. At that time, she was in her usual state of health. It was noted that she saw AHF clinic 03/25/17 with persistent volume overload on high dose lasix, she was transitioned to torsemide with subsequent improvement in her SOB and DOE.    She presents to clinic today for follow up and for preoperative clearance for colon surgery. She gets her INR checked with her PCP [name] PAC. Since her last clinic visit, she has done well. However, she reports two episodes of right sided chest pain that radiates down her right arm., since her last clinic visit There are no associated symptoms and the pain occurs at rest. The pain is relieved with 2 SL nitro tablets. She does light housework and denies chest pain with exertion.  EKG today Afib with HR 39 bpm. She has taken her toprol today. She is asymptomatic, denies dizziness, lightheadedness, recent falls, and feelings of pre-syncope. She did not take demadex this morning as she has doctor's appt and errands. She is generally compliant on all medications.   Past Medical History:  Diagnosis Date  . Allergy    rhinitis  . Aortic stenosis    mild by echo 06/2017   . Arthritis   . Breast cancer ([place]) 01/06/2012  . Cancer [facility])    right colon and left breast  . Chronic diastolic CHF (congestive heart failure) ([place])   . Colon cancer ([place]) 01/06/2012  . COPD (chronic obstructive pulmonary disease) ([place])   . Coronary artery disease 2006   nonobstructive with 20-30% ostial LAD and LM  . Dilated aortic root ([place])    8mmHg by echo 06/2017  . Diverticulosis   . Edema extremities   . GERD (gastroesophageal reflux disease)   . Hernia   . Hiatal hernia    denies  . Hyperlipidemia   . Hypertension   . Mild aortic stenosis    echo 11/2015 but not noted on echo 06/2016  . Osteopenia   . Permanent atrial fibrillation ([facility])    chronic atrial fibrillation  . Pneumonia    hx child  . Pulmonary HTN ([place])    moderate to severe PASP 42mmHg echo 11/2015 - now 34mmHg by echo 06/2017  . Takotsubo syndrome 07/29/2015   a. EF 35-40% by echo; akinesis of mid-apical anteroseptal and apical myocardium.  EF now normalized on echo 11/2015    Past Surgical History:  Procedure Laterality Date  . APPENDECTOMY    . BREAST SURGERY     lumpectomy left  . CARDIAC CATHETERIZATION    . CARDIAC CATHETERIZATION N/A 07/28/2015  Procedure: Left Heart Cath and Coronary Angiography;  Surgeon: Susan M Martinique, MD;  Location: [place] [facility];  Service: Cardiovascular;  Laterality: N/A;  . CHOLECYSTECTOMY    . COLECTOMY     right side  . EXCISION OF ACCESSORY NIPPLE Bilateral 05/30/2013   Procedure: BILATERAL NIPPLE BIOPSY;  Surgeon: Susan Roof, MD;  Location: [place];  Service: General;  Laterality: Bilateral;  . EYE SURGERY Bilateral 12   cataracts  . IR RADIOLOGIST EVAL & MGMT  07/26/2017  . MASTECTOMY PARTIAL / LUMPECTOMY  2008   left breast  . SPLIT NIGHT STUDY  02/02/2016        Current Medications: Current Meds  Medication Sig  . acetaminophen (TYLENOL) 325 MG tablet Take 650 mg by mouth every 6 (six) hours as needed for mild pain. Reported on 03/15/2016  .  amLODipine (NORVASC) 5 MG tablet TAKE ONE TABLET BY MOUTH EVERY DAY  . augmented betamethasone dipropionate (DIPROLENE-AF) 0.05 % ointment Apply 1 application topically 2 (two) times daily. Spot on breast and both legs  . cetirizine (ZYRTEC) 10 MG tablet Take 10 mg by mouth daily as needed for allergies.  . clonazePAM (KLONOPIN) 0.5 MG tablet TAKE 1 TABLET BY MOUTH TWICE DAILY AS NEEDED FOR ANXIETY  . cloNIDine (CATAPRES) 0.1 MG tablet TAKE ONE TABLET BY MOUTH TWICE DAILY  . HYDROcodone-acetaminophen (NORCO) 7.5-325 MG tablet Take 1 tablet by mouth every 6 (six) hours as needed for severe pain.  Marland Kitchen ketoconazole (NIZORAL) 2 % cream Apply to affected area twice daily.  Marland Kitchen losartan (COZAAR) 100 MG tablet TAKE ONE TABLET BY MOUTH EVERY DAY  . mirtazapine (REMERON) 15 MG tablet TAKE 1 TABLET BY MOUTH AT BEDTIME  . nitroGLYCERIN (NITROSTAT) 0.4 MG SL tablet PLACE ONE TABLET UNDER THE TONGUE EVERY FIVE MINUTES AS NEEDED FOR CHEST PAIN. [place] REPEAT FOR THREE DOSES.  Marland Kitchen omeprazole (PRILOSEC) 20 MG capsule Take 20 mg by mouth daily as needed (HEARTBURN / ACID REFLUX).  Marland Kitchen OVER THE COUNTER MEDICATION Place 1 drop into both eyes daily as needed (dry eyes). Eye drops  . spironolactone (ALDACTONE) 25 MG tablet TAKE ONE-HALF TABLET BY MOUTH ONCE DAILY  . torsemide (DEMADEX) 20 MG tablet Take 60 mg by mouth daily.   Marland Kitchen triamcinolone cream (KENALOG) 0.1 % Apply 1 application topically 2 (two) times daily.  Marland Kitchen warfarin (COUMADIN) 1 MG tablet Take 0.5 tablets (0.5 mg total) by mouth as directed. M-W-F-SA (Patient taking differently: Take 3 mg by mouth See admin instructions. Mon/Wed/Fri/Sat)  . warfarin (COUMADIN) 5 MG tablet TAKE ONE-HALF TABLET (2.5MG ) BY MOUTH DAILY. (ON MONDAY, WEDNESDAY, FRIDAY ANDSATURDAY, ADD ONE-HALF TABLET OF WARFARIN 1MG ).  . [DISCONTINUED] metoprolol succinate (TOPROL-XL) 50 MG 24 hr tablet TAKE 1 TABLET BY MOUTH ONCE DAILY TAKE  WITH  OR  FOLLOWING  A  MEAL  . [DISCONTINUED] montelukast  (SINGULAIR) 10 MG tablet Take 1 tablet (10 mg total) by mouth at bedtime. (Patient taking differently: Take 10 mg by mouth at bedtime as needed (for allergies). )  . [DISCONTINUED] omeprazole (PRILOSEC) 20 MG capsule Take 1 capsule (20 mg total) by mouth daily as needed (heartburn, acid reflux). (Patient taking differently: Take 20 mg by mouth 2 (two) times daily before a meal. )     Allergies:   Contrast media [iodinated diagnostic agents]; Clonidine derivatives; Statins; Sulfa antibiotics; Celebrex [celecoxib]; Isosorbide nitrate; Other; and Tape   Social History   Socioeconomic History  . Marital status: Married    Spouse name:  None  . Number of children: None  . Years of education: None  . Highest education level: None  Social Needs  . Financial resource strain: None  . Food insecurity - worry: None  . Food insecurity - inability: None  . Transportation needs - medical: None  . Transportation needs - non-medical: None  Occupational History  . None  Tobacco Use  . Smoking status: Never Smoker  . Smokeless tobacco: Never Used  Substance and Sexual Activity  . Alcohol use: No  . Drug use: No  . Sexual activity: Not Currently  Other Topics Concern  . None  Social History Narrative  . None     Family History: The patient's family history includes Cancer in her sister and sister; Heart attack in her mother; Heart disease in her mother; Heart disease (age of onset: 63) in her brother; Hypertension in her father. There is no history of Stroke.  ROS:   Please see the history of present illness.    All other systems reviewed and are negative.  EKGs/Labs/Other Studies Reviewed:    The following studies were reviewed today:  Echo 06/14/17: Study Conclusions - Left ventricle: The cavity size was normal. Wall thickness was   increased in a pattern of mild LVH. Systolic function was normal.   The estimated ejection fraction was in the range of 60% to 65%.   Wall motion was  normal; there were no regional wall motion   abnormalities. The study is not technically sufficient to allow   evaluation of LV diastolic function. - Aortic valve: Calcified leaflets. There is mild stenosis. Trivial   AI. Mean gradient (S): 11 mm Hg. Peak gradient (S): 20 mm Hg.   Valve area (VTI): 1.54 cm^2. Valve area (Vmax): 1.66 cm^2. Valve   area (Vmean): 1.59 cm^2. - Aorta: Ascending aorta: 39 mm (ED) - Ascending aorta: The ascending aorta was mildly dilated. - Mitral valve: Mildly thickened leaflets . There was trivial   regurgitation. - Left atrium: The atrium was mildly dilated. - Right atrium: Severely dilated. - Tricuspid valve: There was moderate regurgitation. - Pulmonary arteries: PA peak pressure: 41 mm Hg (S). - Inferior vena cava: The vessel was dilated. The respirophasic   diameter changes were in the normal range (= 50%), consistent   with normal central venous pressure.  Impressions: - Compared to a prior study in 2017, the LVEF is higher at 60-65%.   The RVSP is lower at 41 mmHg (average). A-fib is present. There   is severe RAE and mild LAE and moderate TR. The aortic root is   normal size, however, the most distally visualized portion of the   ascending aorta is dilated, measuring 3.9 cm.   Left heart cath 07/28/15:  Prox RCA to Mid RCA lesion, 15% stenosed.  Prox LAD lesion, 20% stenosed.  There is severe left ventricular systolic dysfunction.   1. Minor nonobstructive CAD 2. Severe left ventriclar dysfunction. Wall motion abnormality consistent with Takotsubo cardiomyopathy. Plan: medical management.   EKG:  EKG is ordered today.  The ekg ordered today demonstrates Afib ventricular rate 39 bpm  Recent Labs: 11/29/2016: NT-Pro BNP 834 07/07/2017: B Natriuretic Peptide 128.3 07/21/2017: ALT 15; BUN 19; Creat 0.97; Hemoglobin 11.2; Platelets 384; Potassium 3.9; Sodium 134  Recent Lipid Panel    Component Value Date/Time   CHOL 157 11/29/2016 1107     TRIG 84 11/29/2016 1107   HDL 59 11/29/2016 1107   CHOLHDL 2.7 11/29/2016 1107   CHOLHDL 3.5  03/15/2016 1057   VLDL 23 03/15/2016 1057   LDLCALC 81 11/29/2016 1107    Physical Exam:    VS:  BP (!) 160/58   Pulse (!) 39   Ht 5\' 6"  (1.676 m)   Wt 200 lb 1.9 oz (90.8 kg)   BMI 32.30 kg/m     Wt Readings from Last 3 Encounters:  10/25/17 200 lb 1.9 oz (90.8 kg)  10/20/17 200 lb 6.4 oz (90.9 kg)  09/07/17 201 lb 3.2 oz (91.3 kg)     GEN: Well nourished, well developed in no acute distress HEENT: Normal NECK: No JVD; No carotid bruits LYMPHATICS: No lymphadenopathy CARDIAC: Irregular rhythm, bradycardic rate, no murmurs, rubs, gallops RESPIRATORY:  Clear to auscultation without rales, wheezing or rhonchi  ABDOMEN: Soft, non-tender, non-distended MUSCULOSKELETAL:  trace edema; No deformity  SKIN: Warm and dry NEUROLOGIC:  Alert and oriented x 3 PSYCHIATRIC:  Normal affect   ASSESSMENT and PLAN:    History of NSTEMI (non-ST elevated myocardial infarction) ([place]); Takotsubo syndrome; cath 2016 with nonobstructive disease, myoview 2017 negative for reversible ischemia Pt describes atypical chest pain. This has occurred twice and relieved with nitro. She is also bradycardic. Given her cath in 2016 with minimal disease, will focus on improving her heart rate first.   Permanent atrial fibrillation ([place]), on chronic coumadin EKG today with Afib and HR 39. Decrease toprol to 25 mg daily. See back in one week to evaluate HR before formally clearing for surgery (colon resection). Per our pharmacy recommendations: "CHADS2VASc score of 65 (age x2, gender, HTN, CAD, CHF). OK to hold coumadin for 5 days for surgery."  Chronic diastolic CHF (congestive heart failure) ([place]) Pt has minimal edema on exam, but states she did not take her demadex this morning because of her doctor's appts. She denies orthopnea and SOB. Continue with current medications. Last echo 2018 with normal EF.  Aortic  valve stenosis, etiology of cardiac valve disease unspecified Stable by echo 2018, mild stenosis  Essential hypertension Pressure slightly elevated today, but she has not taken her demadex. We decreased her toprol to 25 mg daily. Will recheck BP at follow up in one week. If BP continues to be elevated, increase noravsc.   Dyslipidemia, goal LDL below 70 11/29/2016: Cholesterol, Total 157; HDL 59; LDL Calculated 81; Triglycerides 84 Consider rechecking annual labs, if not checked by PCP. She is controlling her cholesterol with diet.   Case discussed with Dr. Irish Lack (DOD). Decreased toprol. Plan to see patient back in one week to check her heart rate. If no further chest pain symptoms with improved heart rate, she can be cleared for surgery. See coumadin recommendations above.   Medication Adjustments/Labs and Tests Ordered: Current medicines are reviewed at length with the patient today.  Concerns regarding medicines are outlined above.  Orders Placed This Encounter  Procedures  . EKG 12-Lead   Meds ordered this encounter  Medications  . DISCONTD: metoprolol succinate (TOPROL XL) 25 MG 24 hr tablet    Sig: Take 1 tablet (25 mg total) by mouth daily.    Dispense:  30 tablet    Refill:  1  . metoprolol succinate (TOPROL XL) 25 MG 24 hr tablet    Sig: Take 1 tablet (25 mg total) by mouth daily.    Dispense:  30 tablet    Refill:  1    Signed, Ledora Bottcher, [place]  10/25/2017 1:57 PM    [facility]

## 2017-10-25 ENCOUNTER — Ambulatory Visit: Payer: Medicare Other | Admitting: Physician Assistant

## 2017-10-25 ENCOUNTER — Encounter: Payer: Self-pay | Admitting: Physician Assistant

## 2017-10-25 VITALS — BP 160/58 | HR 39 | Ht 66.0 in | Wt 200.1 lb

## 2017-10-25 DIAGNOSIS — I252 Old myocardial infarction: Secondary | ICD-10-CM

## 2017-10-25 DIAGNOSIS — I350 Nonrheumatic aortic (valve) stenosis: Secondary | ICD-10-CM

## 2017-10-25 DIAGNOSIS — E785 Hyperlipidemia, unspecified: Secondary | ICD-10-CM | POA: Diagnosis not present

## 2017-10-25 DIAGNOSIS — I482 Chronic atrial fibrillation: Secondary | ICD-10-CM

## 2017-10-25 DIAGNOSIS — I5032 Chronic diastolic (congestive) heart failure: Secondary | ICD-10-CM | POA: Diagnosis not present

## 2017-10-25 DIAGNOSIS — I4821 Permanent atrial fibrillation: Secondary | ICD-10-CM

## 2017-10-25 DIAGNOSIS — I10 Essential (primary) hypertension: Secondary | ICD-10-CM

## 2017-10-25 DIAGNOSIS — I5181 Takotsubo syndrome: Secondary | ICD-10-CM

## 2017-10-25 MED ORDER — METOPROLOL SUCCINATE ER 25 MG PO TB24: 25.0000 mg | ORAL_TABLET | Freq: Every day | ORAL | 1 refills | Status: DC

## 2017-10-25 NOTE — Patient Instructions (Addendum)
Medication Instructions:  Your physician has recommended you make the following change in your medication:  1.   REDUCE the Toprol XL to 25 mg taking 1 tablet aily   Labwork: None ordered  Testing/Procedures: None ordered  Follow-Up: Your physician recommends that you schedule a follow-up appointment in: 11/02/17 ARRIVE AT 10:45 TO SEE DAYNA DUNN, PA-C   Any Other Special Instructions Will Be Listed Below (If Applicable).     If you need a refill on your cardiac medications before your next appointment, please call your pharmacy.

## 2017-10-25 NOTE — Telephone Encounter (Signed)
Patient with diagnosis of atrial fibrillation on warfarin for anticoagulation.    Procedure: colon surgery Date of procedure: TBD  CHADS2-VASc score of  6 (CHF, HTN, AGE x 2, CAD, female)  CrCl 66.3 Platelet count 384  Per office protocol, patient can hold warfarin for 5 days prior to procedure.   Patient will not need bridging with Lovenox (enoxaparin) around procedure.  If not bridging, patient should restart warfarin on the evening of procedure or day after, at discretion of procedure MD  .

## 2017-10-25 NOTE — Telephone Encounter (Signed)
Pt takes warfarin for afib with CHADS2VASc score of 6 (age x2, gender, HTN, CAD, CHF). Ok to hold warfarin for 5 days prior to procedure. Would forward to Dena Billet, [place] managing patient's Coumadin as well.

## 2017-10-27 DIAGNOSIS — Z8719 Personal history of other diseases of the digestive system: Secondary | ICD-10-CM | POA: Diagnosis not present

## 2017-10-27 DIAGNOSIS — K580 Irritable bowel syndrome with diarrhea: Secondary | ICD-10-CM | POA: Diagnosis not present

## 2017-10-27 DIAGNOSIS — K625 Hemorrhage of anus and rectum: Secondary | ICD-10-CM | POA: Diagnosis not present

## 2017-10-27 DIAGNOSIS — Z85038 Personal history of other malignant neoplasm of large intestine: Secondary | ICD-10-CM | POA: Diagnosis not present

## 2017-10-31 ENCOUNTER — Encounter: Payer: Self-pay | Admitting: Physician Assistant

## 2017-10-31 NOTE — Progress Notes (Addendum)
Cardiology Office Note    Date:  11/02/2017  ID:  [name], DOB [birth year], MRN [id number] PCP:  [name], MD  Cardiologist: Dr. [name]   Chief Complaint: pre-operative evaluation, follow up bradycardia  History of Present Illness:  [name] is a 83 y.o. female with history of CAD s/p NSTEMI felt due to Takotsubo by cath 07/2015 (transient LV dysfunction EF 35-40% with normalization), mild AS, mod TR, mildly dilated ascending aorta, chronic diastolic CHF, pulmonary HTN (felt likely due to pulm venous HTN from left-sided heart failure), permanent atrial fib on coumadin, COPD, HLD (myalgias with atorvastatin, Crestor, simvastatin, Zetia), HTN who presents for pre-operative evaluation.   At time of her NSTEMI in 2016, cath showed 15% prox-mid RCA, 20% prox LAD, EF 25-35% by cath and 35-40%. Her EF has subsequently normalized with last echo in 06/2017 showing mild LVH, EF 60-65%, mild AS, mildly dilated ascending aorta, moderate TR, mild LAE, severe RAE, elevated PASP 72mmHg (previously 65 mmHg in 2016). Last nuc was in 12/2015 felt low risk per Dr. [name] interpretation, "very small area of ischemia in the mid anteroseptal and apical lateral locations most likely due to variation in breast attenuation artifact." She is also followed by Dr. Aundra Dubin for pulmonary HTN - CTA chest in 11/16 with no PE. PFTs in 7/15 with mild obstructive lung disease. She had a negative sleep study in 2017. More recently she was seen in clinic 2/19 for pre-op clearance for colon surgery. She had reported two episodes of transient right sided chest pain at rest, but had not had any recent symptoms with exertion. She was noted to be bradycardic with HR of 39 thus metoprolol was cut back to 25mg  daily. Her blood pressure was slightly elevated. Angie Duke PA-C recommended that she follow up in clinic and if no further episodes of CP and HR improved, that she could be cleared for surgery.  [name] returns today alone  feeling well. She has not had any further chest discomfort. HR has improved to the 50s-60s. She denies any dizziness, syncope, dyspnea. She does all her own housework and is able to go up steps without angina or dyspnea. She is going to have colon surgery for diverticulitis. This is not formally scheduled yet. She reports seeing surgery next week to discuss next steps. She reports INR is followed regularly by Karis Juba and states she follows what they tell her to do.   Past Medical History:  Diagnosis Date  . Allergy    rhinitis  . Aortic stenosis    mild by echo 06/2017  . Arthritis   . Bradycardia    a. 10/2017 -> beta blocker cut back due to HR 39.  . Breast cancer ([place]) 01/06/2012  . Cancer [facility])    right colon and left breast  . Chronic diastolic CHF (congestive heart failure) ([place])   . Colon cancer ([place]) 01/06/2012  . COPD (chronic obstructive pulmonary disease) ([place])   . Coronary artery disease 2006   a.  NSTEMI in 2016, cath showed 15% prox-mid RCA, 20% prox LAD, EF 25-35% by cath and 35-40% -> felt due to Takotsubo cardiomyopathy.  . Dilated aortic root ([place])    30mmHg by echo 06/2017  . Diverticulosis   . Edema extremities   . GERD (gastroesophageal reflux disease)   . Hernia   . Hiatal hernia    denies  . Hyperlipidemia   . Hypertension   . Mild aortic stenosis  echo 11/2015 but not noted on echo 06/2016  . Osteopenia   . Permanent atrial fibrillation ([facility])    chronic atrial fibrillation  . Pneumonia    hx child  . Pulmonary HTN ([place])    a. moderate to severe PASP 2mmHg echo 11/2015 - now 32mmHg by echo 06/2017. CTA chest in 11/16 with no PE. PFTs in 7/15 with mild obstructive lung disease. She had a negative sleep study in 2017. b. Felt due to left sided HF.  . Takotsubo syndrome 07/29/2015   a. EF 35-40% by echo; akinesis of mid-apical anteroseptal and apical myocardium.  EF now normalized on echo 11/2015    Past Surgical History:  Procedure Laterality Date    . APPENDECTOMY    . BREAST SURGERY     lumpectomy left  . CARDIAC CATHETERIZATION    . CARDIAC CATHETERIZATION N/A 07/28/2015   Procedure: Left Heart Cath and Coronary Angiography;  Surgeon: Peter M Martinique, MD;  Location: [facility];  Service: Cardiovascular;  Laterality: N/A;  . CHOLECYSTECTOMY    . COLECTOMY     right side  . EXCISION OF ACCESSORY NIPPLE Bilateral 05/30/2013   Procedure: BILATERAL NIPPLE BIOPSY;  Surgeon: Merrie Roof, MD;  Location: [facility];  Service: General;  Laterality: Bilateral;  . EYE SURGERY Bilateral 14   cataracts  . IR RADIOLOGIST EVAL & MGMT  07/26/2017  . MASTECTOMY PARTIAL / LUMPECTOMY  2008   left breast  . SPLIT NIGHT STUDY  02/02/2016        Current Medications: Current Meds  Medication Sig  . acetaminophen (TYLENOL) 325 MG tablet Take 650 mg by mouth every 6 (six) hours as needed for mild pain. Reported on 03/15/2016  . amLODipine (NORVASC) 5 MG tablet TAKE ONE TABLET BY MOUTH EVERY DAY  . augmented betamethasone dipropionate (DIPROLENE-AF) 0.05 % ointment Apply 1 application topically 2 (two) times daily. Spot on breast and both legs  . cetirizine (ZYRTEC) 10 MG tablet Take 10 mg by mouth daily as needed for allergies.  . clonazePAM (KLONOPIN) 0.5 MG tablet TAKE 1 TABLET BY MOUTH TWICE DAILY AS NEEDED FOR ANXIETY  . cloNIDine (CATAPRES) 0.1 MG tablet TAKE ONE TABLET BY MOUTH TWICE DAILY  . HYDROcodone-acetaminophen (NORCO) 7.5-325 MG tablet Take 1 tablet by mouth every 6 (six) hours as needed for severe pain.  Marland Kitchen ketoconazole (NIZORAL) 2 % cream Apply to affected area twice daily.  Marland Kitchen losartan (COZAAR) 100 MG tablet TAKE ONE TABLET BY MOUTH EVERY DAY  . metoprolol succinate (TOPROL XL) 25 MG 24 hr tablet Take 1 tablet (25 mg total) by mouth daily.  . mirtazapine (REMERON) 15 MG tablet TAKE 1 TABLET BY MOUTH AT BEDTIME  . nitroGLYCERIN (NITROSTAT) 0.4 MG SL tablet PLACE ONE TABLET UNDER THE TONGUE EVERY FIVE MINUTES AS NEEDED FOR CHEST  PAIN. [place] REPEAT FOR THREE DOSES.  Marland Kitchen omeprazole (PRILOSEC) 20 MG capsule Take 20 mg by mouth daily as needed (HEARTBURN / ACID REFLUX).  Marland Kitchen OVER THE COUNTER MEDICATION Place 1 drop into both eyes daily as needed (dry eyes). Eye drops  . spironolactone (ALDACTONE) 25 MG tablet TAKE ONE-HALF TABLET BY MOUTH ONCE DAILY  . torsemide (DEMADEX) 20 MG tablet Take 60 mg by mouth daily.   Marland Kitchen triamcinolone cream (KENALOG) 0.1 % Apply 1 application topically 2 (two) times daily.  Marland Kitchen warfarin (COUMADIN) 1 MG tablet Take 0.5 tablets (0.5 mg total) by mouth as directed. M-W-F-SA (Patient taking differently: Take 3 mg by mouth See  admin instructions. Mon/Wed/Fri/Sat)  . warfarin (COUMADIN) 5 MG tablet TAKE ONE-HALF TABLET (2.5MG ) BY MOUTH DAILY. (ON MONDAY, WEDNESDAY, FRIDAY ANDSATURDAY, ADD ONE-HALF TABLET OF WARFARIN 1MG ).   Allergies:   Contrast media [iodinated diagnostic agents]; Clonidine derivatives; Statins; Sulfa antibiotics; Celebrex [celecoxib]; Isosorbide nitrate; Other; and Tape   Social History   Socioeconomic History  . Marital status: Married    Spouse name: None  . Number of children: None  . Years of education: None  . Highest education level: None  Social Needs  . Financial resource strain: None  . Food insecurity - worry: None  . Food insecurity - inability: None  . Transportation needs - medical: None  . Transportation needs - non-medical: None  Occupational History  . None  Tobacco Use  . Smoking status: Never Smoker  . Smokeless tobacco: Never Used  Substance and Sexual Activity  . Alcohol use: No  . Drug use: No  . Sexual activity: Not Currently  Other Topics Concern  . None  Social History Narrative  . None     Family History:  Family History  Problem Relation Age of Onset  . Heart disease Mother   . Heart attack Mother   . Cancer Sister        stomach and colon  . Heart disease Brother 46  . Hypertension Father   . Cancer Sister   . Stroke Neg Hx    ROS:    Please see the history of present illness. All other systems are reviewed and otherwise negative.    PHYSICAL EXAM:   VS:  BP 138/60 (BP Location: Right Arm, Patient Position: Sitting, Cuff Size: Normal)   Pulse (!) 57   Ht 5\' 6"  (1.676 m)   Wt 201 lb (91.2 kg)   BMI 32.44 kg/m   BMI: Body mass index is 32.44 kg/m. GEN: Well nourished, well developed elderly WF in no acute distress  HEENT: normocephalic, atraumatic Neck: no JVD, carotid bruits, or masses Cardiac: irregularly irregular, rate controlled, no murmurs, rubs, or gallops, soft trace edema bilaterally nonpitting with chronic varicosities Respiratory:  clear to auscultation bilaterally, normal work of breathing GI: soft, nontender, nondistended, + BS MS: no deformity or atrophy  Skin: warm and dry, no rash Neuro:  Alert and Oriented x 3, Strength and sensation are intact, follows commands Psych: euthymic mood, full affect  Wt Readings from Last 3 Encounters:  11/02/17 201 lb (91.2 kg)  10/25/17 200 lb 1.9 oz (90.8 kg)  10/20/17 200 lb 6.4 oz (90.9 kg)      Studies/Labs Reviewed:   EKG:  EKG was ordered today and personally reviewed by me and demonstrates atrial fib 64bpm, no acute ST-T changes  Recent Labs: 11/29/2016: NT-Pro BNP 834 07/07/2017: B Natriuretic Peptide 128.3 07/21/2017: ALT 15; BUN 19; Creat 0.97; Hemoglobin 11.2; Platelets 384; Potassium 3.9; Sodium 134   Lipid Panel    Component Value Date/Time   CHOL 157 11/29/2016 1107   TRIG 84 11/29/2016 1107   HDL 59 11/29/2016 1107   CHOLHDL 2.7 11/29/2016 1107   CHOLHDL 3.5 03/15/2016 1057   VLDL 23 03/15/2016 1057   LDLCALC 81 11/29/2016 1107    Additional studies/ records that were reviewed today include: Summarized above.    ASSESSMENT & PLAN:   1. Pre-operative evaluation - given her cardiac risk factors we discussed that she is at least moderate risk for surgery. She is able to perform at least 5 METS without any angina or dyspnea. Based  on  ACC/AHA guidelines, [name] would be at acceptable risk for the planned procedure without further cardiovascular testing. Our pharmacist Tommy Medal has reviewed her chart and has recommended that "per office protocol, patient can hold warfarin for 5 days prior to procedure. Patient will not need bridging with Lovenox (enoxaparin) around procedure. If not bridging, patient should restart warfarin on the evening of procedure or day after, at discretion of procedure MD." Surgery has not yet been scheduled so this instruction should come from the surgeon's office as soon as a date is set. Coumadin is not followed by our clinic but instead primary care, so we would recommend follow-up with their clinic for future monitoring as well. Will route this bundled recommendation to requesting provider Dr. Marlou Starks via [place] fax function.  2. Chronic atrial fib with bradycardia - bradycardia resolved. HR now 50s-60s and asymptomatic. No bleeding issues on Coumadin. See above. Note she has 2 different doses of Coumadin listed on her MAR. Last INR check I can see was in 07/2017 although one of the Coumadin doses was filled by the primary care office in 09/2017. I have asked nurse to call primary care to clarify the dose and to make sure patient has continued checks scheduled as the patient believes the most recent check was recently, but cannot recall. It is not clear why she is on Coumadin as opposed to NOAC but this could be considered in the future. 3. Essential HTN - given age and comorbidities, would continue present regimen. 4. Chronic diastolic CHF - appears euvolemic. She has trace ankle edema which is in the setting of her varicosities. She reports this is at baseline for her. It is nonpitting. Reviewed 2g sodium restriction and 2L fluid restriction with patient. 5. CAD - mild by previous cath. No recent anginal symptoms. Warning sx reviewed. Continue to monitor. Not on ASA due to concomitant warfarin. 6. Dilated  ascending aorta - noted on echo 07/2017. Further surveillance at the discretion of primary cardiologist. This would be due around 07/2018.  Disposition: F/u with Dr. [name] in 3 months (6 month f/u was due 11/2017 but since she has been seen twice this month and is stable, will push her usual f/u back to June).   Medication Adjustments/Labs and Tests Ordered: Current medicines are reviewed at length with the patient today.  Concerns regarding medicines are outlined above. Medication changes, Labs and Tests ordered today are summarized above and listed in the Patient Instructions accessible in Encounters.   Signed, Charlie Pitter, PA-C  11/02/2017 10:38 AM    [address], [place], [place]  [address] Phone: [phone]; Fax: [phone]

## 2017-11-02 ENCOUNTER — Ambulatory Visit: Payer: Medicare Other | Admitting: Physician Assistant

## 2017-11-02 ENCOUNTER — Encounter: Payer: Self-pay | Admitting: Physician Assistant

## 2017-11-02 ENCOUNTER — Telehealth: Payer: Self-pay | Admitting: Physician Assistant

## 2017-11-02 ENCOUNTER — Telehealth: Payer: Self-pay | Admitting: Family Medicine

## 2017-11-02 VITALS — BP 138/60 | HR 57 | Ht 66.0 in | Wt 201.0 lb

## 2017-11-02 DIAGNOSIS — I5032 Chronic diastolic (congestive) heart failure: Secondary | ICD-10-CM

## 2017-11-02 DIAGNOSIS — I77810 Thoracic aortic ectasia: Secondary | ICD-10-CM

## 2017-11-02 DIAGNOSIS — I2510 Atherosclerotic heart disease of native coronary artery without angina pectoris: Secondary | ICD-10-CM

## 2017-11-02 DIAGNOSIS — I10 Essential (primary) hypertension: Secondary | ICD-10-CM | POA: Diagnosis not present

## 2017-11-02 DIAGNOSIS — I482 Chronic atrial fibrillation, unspecified: Secondary | ICD-10-CM

## 2017-11-02 DIAGNOSIS — Z01818 Encounter for other preprocedural examination: Secondary | ICD-10-CM | POA: Diagnosis not present

## 2017-11-02 DIAGNOSIS — R001 Bradycardia, unspecified: Secondary | ICD-10-CM | POA: Diagnosis not present

## 2017-11-02 NOTE — Telephone Encounter (Signed)
[facility] called about pt inr. The last one done was in nov 2018. They said if we are going to to prescribe coumadin we need to continue to monitor it regularly. Pt is going to call in the morning to schedule app.

## 2017-11-02 NOTE — Telephone Encounter (Signed)
Inquired who checks pt's Coumadin/INR.  Pt tells me that her PCP office does.  She then states that it has been awhile since she has been checked. Informed patient that she has not had an INR level since 07/2017 and advised her to call their office first thing tomorrow and make an appointment to have INR checked immediately.  Informed her PCP office about my findings.

## 2017-11-02 NOTE — Telephone Encounter (Signed)
New Message   Patient states that she is returning call of Sherri. Not sure what it was in reference to. Please call to discuss.

## 2017-11-02 NOTE — Patient Instructions (Addendum)
Medication Instructions:  Your physician recommends that you continue on your current medications as directed. Please refer to the Current Medication list given to you today.  * If you need a refill on your cardiac medications before your next appointment, please call your pharmacy. *  Labwork: None ordered  Testing/Procedures: None ordered  Follow-Up: Your physician recommends that you schedule a follow-up appointment in: 3 months with Dr. [name].  Thank you for choosing [facility]!!

## 2017-11-03 ENCOUNTER — Ambulatory Visit: Payer: Medicare Other | Admitting: Physician Assistant

## 2017-11-03 ENCOUNTER — Encounter: Payer: Self-pay | Admitting: Physician Assistant

## 2017-11-03 VITALS — BP 132/58 | HR 66 | Temp 98.5°F | Resp 16 | Wt 200.4 lb

## 2017-11-03 DIAGNOSIS — Z7901 Long term (current) use of anticoagulants: Secondary | ICD-10-CM

## 2017-11-03 LAB — PT WITH INR/FINGERSTICK
INR, fingerstick: 3.4 ratio — ABNORMAL HIGH
PT FINGERSTICK: 40.2 s — AB (ref 10.5–13.1)

## 2017-11-03 NOTE — Progress Notes (Signed)
Patient ID: [name] MRN: [id number], DOB: October 19, 1934, 83 y.o. Date of Encounter: 11/03/2017, 4:03 PM    Chief Complaint:  Chief Complaint  Patient presents with  . surgery clearance  . coumadin check  . Nasal Congestion     HPI: 83 y.o. year old female presents with above.   Today I have reviewed her recent Cardiology office notes.  She had recent visit with them 11/02/2017.  For cardiac surgical / preop evaluation/surgical clearance.  At that visit they reported that she was going to be having colon surgery for diverticulitis. Today patient states that she has been seeing Dr. Cristina Gong and that both he and Dr. Marlou Starks will be involved in her surgery.  States that she has an appointment on Tuesday at Dr. Ethlyn Gallery office and then they will get the surgery scheduled at that time.  States that Dr. Marlou Starks did her surgery in 2003.  Also states that her GYN is going to be involved in the surgery as well.   Her Coumadin dose----  she takes: 2.5 mg on Sundays, Tuesdays, Thursdays. Takes 3 mg all other days of the week.  She states that for the past 2-3 days she has been having some sneezing and runny nose. states that "it just drips" and is watery clear drainage.  States that she has started taking some cetirizine.  She has no other concerns to address today.     Home Meds:   Outpatient Medications Prior to Visit  Medication Sig Dispense Refill  . acetaminophen (TYLENOL) 325 MG tablet Take 650 mg by mouth every 6 (six) hours as needed for mild pain. Reported on 03/15/2016    . amLODipine (NORVASC) 5 MG tablet TAKE ONE TABLET BY MOUTH EVERY DAY 30 tablet 3  . augmented betamethasone dipropionate (DIPROLENE-AF) 0.05 % ointment Apply 1 application topically 2 (two) times daily. Spot on breast and both legs    . cetirizine (ZYRTEC) 10 MG tablet Take 10 mg by mouth daily as needed for allergies.    . clonazePAM (KLONOPIN) 0.5 MG tablet TAKE 1 TABLET BY MOUTH TWICE DAILY AS NEEDED FOR ANXIETY 60  tablet 2  . cloNIDine (CATAPRES) 0.1 MG tablet TAKE ONE TABLET BY MOUTH TWICE DAILY 60 tablet 11  . HYDROcodone-acetaminophen (NORCO) 7.5-325 MG tablet Take 1 tablet by mouth every 6 (six) hours as needed for severe pain. 30 tablet 0  . ketoconazole (NIZORAL) 2 % cream Apply to affected area twice daily. 15 g 0  . losartan (COZAAR) 100 MG tablet TAKE ONE TABLET BY MOUTH EVERY DAY 30 tablet 3  . metoprolol succinate (TOPROL XL) 25 MG 24 hr tablet Take 1 tablet (25 mg total) by mouth daily. 30 tablet 1  . mirtazapine (REMERON) 15 MG tablet TAKE 1 TABLET BY MOUTH AT BEDTIME 30 tablet 2  . nitroGLYCERIN (NITROSTAT) 0.4 MG SL tablet PLACE ONE TABLET UNDER THE TONGUE EVERY FIVE MINUTES AS NEEDED FOR CHEST PAIN. [place] REPEAT FOR THREE DOSES. 30 tablet 2  . omeprazole (PRILOSEC) 20 MG capsule Take 20 mg by mouth daily as needed (HEARTBURN / ACID REFLUX).    Marland Kitchen OVER THE COUNTER MEDICATION Place 1 drop into both eyes daily as needed (dry eyes). Eye drops    . spironolactone (ALDACTONE) 25 MG tablet TAKE ONE-HALF TABLET BY MOUTH ONCE DAILY 45 tablet 3  . torsemide (DEMADEX) 20 MG tablet Take 60 mg by mouth daily.     Marland Kitchen triamcinolone cream (KENALOG) 0.1 % Apply 1 application topically 2 (  two) times daily. 30 g 0  . warfarin (COUMADIN) 1 MG tablet Take 0.5 tablets (0.5 mg total) by mouth as directed. M-W-F-SA (Patient taking differently: Take 3 mg by mouth See admin instructions. Mon/Wed/Fri/Sat) 90 tablet 2  . warfarin (COUMADIN) 5 MG tablet TAKE ONE-HALF TABLET (2.5MG ) BY MOUTH DAILY. (ON MONDAY, WEDNESDAY, FRIDAY ANDSATURDAY, ADD ONE-HALF TABLET OF WARFARIN 1MG ). 90 tablet 2   No facility-administered medications prior to visit.     Allergies:  Allergies  Allergen Reactions  . Contrast Media [Iodinated Diagnostic Agents] Other (See Comments) and Hives    Per pt strong burning sensation starting in chest radiating outward Per pt strong burning sensation starting in chest radiating outward  . Clonidine  Derivatives Other (See Comments)    Throat dry  . Statins Other (See Comments)    "bones hurt"  . Sulfa Antibiotics Diarrhea    Tremors   . Celebrex [Celecoxib] Rash  . Isosorbide Nitrate Itching and Rash  . Other Itching and Rash    Plastic and paper tape and heart monitor pads  . Tape Itching and Rash    Red Where applied and will spread      Review of Systems: See HPI for pertinent ROS. All other ROS negative.    Physical Exam: Blood pressure (!) 132/58, pulse 66, temperature 98.5 F (36.9 C), temperature source Oral, resp. rate 16, weight 90.9 kg (200 lb 6.4 oz), SpO2 98 %., Body mass index is 32.35 kg/m. General:  WF. Appears in no acute distress. Neck: Supple. No thyromegaly. No lymphadenopathy. Lungs: Clear bilaterally to auscultation without wheezes, rales, or rhonchi. Breathing is unlabored. Heart: Irregular rhythm. Msk:  Strength and tone normal for age. Extremities/Skin: Warm and dry.  No edema.  Neuro: Alert and oriented X 3. Moves all extremities spontaneously. Gait is normal. CNII-XII grossly in tact. Psych:  Responds to questions appropriately with a normal affect.     ASSESSMENT AND PLAN:  83 y.o. year old female with  1. Long term (current) use of anticoagulants INR is 3.4.  Continue current dose.  Recheck INR 4 weeks. - PT with INR/Fingerstick  Cardiology note documents that there pharmacist has already given instructions regarding Coumadin dosing Pre- and Post- op. Cardiology has already evaluated regarding presurgical evaluation.  Regarding Mrs. Amorin's sneezing and rhinorrhea she is to continue taking the Zyrtec 10 mg daily for this.   Marin Olp [place], [place], [facility] 11/03/2017 4:03 PM

## 2017-11-04 ENCOUNTER — Telehealth: Payer: Self-pay | Admitting: Physician Assistant

## 2017-11-04 NOTE — Telephone Encounter (Signed)
Discussed switching to Eliquis/Xarelto.   Pt is not sure they can afford it, states that her husband is on it Caryl Comes patient) and they just can't afford this type of medication when they reach the doughnut hole. She is willing to have me look into the cost of both medication to see if more cost effective and/or can pay a reduced cost. She understands I will call her back next week.

## 2017-11-04 NOTE — Telephone Encounter (Signed)
Please call patient. I discussed the Coumadin situation with Dr. [name] given that she had not had INR checked recently -> she did get back in to have it checked with primary care since our visit. We reviewed her chart and it does not appear that she specifically is on Coumadin instead of other blood thinners for a specific reason. Dr. [name] and I both feel it would be reasonable to switch to Eliquis if she wants to go on a different blood thinner that does not involve INR monitoring. Please call patient and ask her if this is something she wants to consider. We are also OK with her continuing Coumadin if she decides to continue that instead. If she wants to discuss changing, would recommend setting up a visit in the pharmacy clinic with a repeat BMET/CBC same day.  Megumi Treaster PA-C

## 2017-11-05 ENCOUNTER — Other Ambulatory Visit: Payer: Self-pay

## 2017-11-05 ENCOUNTER — Emergency Department (HOSPITAL_COMMUNITY): Payer: Medicare Other

## 2017-11-05 ENCOUNTER — Emergency Department (HOSPITAL_COMMUNITY)
Admission: EM | Admit: 2017-11-05 | Discharge: 2017-11-05 | Disposition: A | Discharge status: Home / Self Care | Payer: Medicare Other | Attending: Emergency Medicine | Admitting: Emergency Medicine

## 2017-11-05 DIAGNOSIS — Z7901 Long term (current) use of anticoagulants: Secondary | ICD-10-CM | POA: Insufficient documentation

## 2017-11-05 DIAGNOSIS — I110 Hypertensive heart disease with heart failure: Secondary | ICD-10-CM | POA: Insufficient documentation

## 2017-11-05 DIAGNOSIS — R1031 Right lower quadrant pain: Secondary | ICD-10-CM | POA: Diagnosis not present

## 2017-11-05 DIAGNOSIS — K632 Fistula of intestine: Secondary | ICD-10-CM | POA: Diagnosis not present

## 2017-11-05 DIAGNOSIS — J449 Chronic obstructive pulmonary disease, unspecified: Secondary | ICD-10-CM | POA: Diagnosis not present

## 2017-11-05 DIAGNOSIS — Z79899 Other long term (current) drug therapy: Secondary | ICD-10-CM | POA: Diagnosis not present

## 2017-11-05 DIAGNOSIS — I2510 Atherosclerotic heart disease of native coronary artery without angina pectoris: Secondary | ICD-10-CM | POA: Insufficient documentation

## 2017-11-05 DIAGNOSIS — Z85038 Personal history of other malignant neoplasm of large intestine: Secondary | ICD-10-CM | POA: Diagnosis not present

## 2017-11-05 DIAGNOSIS — Z853 Personal history of malignant neoplasm of breast: Secondary | ICD-10-CM | POA: Insufficient documentation

## 2017-11-05 DIAGNOSIS — K5730 Diverticulosis of large intestine without perforation or abscess without bleeding: Secondary | ICD-10-CM | POA: Diagnosis not present

## 2017-11-05 DIAGNOSIS — I10 Essential (primary) hypertension: Secondary | ICD-10-CM | POA: Diagnosis not present

## 2017-11-05 DIAGNOSIS — I5032 Chronic diastolic (congestive) heart failure: Secondary | ICD-10-CM | POA: Insufficient documentation

## 2017-11-05 DIAGNOSIS — R109 Unspecified abdominal pain: Secondary | ICD-10-CM | POA: Diagnosis not present

## 2017-11-05 LAB — COMPREHENSIVE METABOLIC PANEL
ALBUMIN: 3.6 g/dL (ref 3.5–5.0)
ALT: 16 U/L (ref 14–54)
AST: 23 U/L (ref 15–41)
Alkaline Phosphatase: 80 U/L (ref 38–126)
Anion gap: 12 (ref 5–15)
BILIRUBIN TOTAL: 0.7 mg/dL (ref 0.3–1.2)
BUN: 20 mg/dL (ref 6–20)
CHLORIDE: 100 mmol/L — AB (ref 101–111)
CO2: 25 mmol/L (ref 22–32)
CREATININE: 1 mg/dL (ref 0.44–1.00)
Calcium: 9.6 mg/dL (ref 8.9–10.3)
GFR calc Af Amer: >60 mL/min (ref >60)
GFR, EST NON AFRICAN AMERICAN: 52 mL/min — AB (ref >60)
GLUCOSE: 116 mg/dL — AB (ref 65–99)
Potassium: 4.1 mmol/L (ref 3.5–5.1)
Sodium: 137 mmol/L (ref 135–145)
TOTAL PROTEIN: 7.9 g/dL (ref 6.5–8.1)

## 2017-11-05 LAB — URINALYSIS, ROUTINE W REFLEX MICROSCOPIC
BILIRUBIN URINE: NEGATIVE
GLUCOSE, UA: NEGATIVE mg/dL
Hgb urine dipstick: NEGATIVE
KETONES UR: NEGATIVE mg/dL
LEUKOCYTES UA: NEGATIVE
Nitrite: NEGATIVE
PH: 6 (ref 5.0–8.0)
PROTEIN: NEGATIVE mg/dL
Specific Gravity, Urine: 1.008 (ref 1.005–1.030)

## 2017-11-05 LAB — PROTIME-INR
INR: 2.16
PROTHROMBIN TIME: 23.9 s — AB (ref 11.4–15.2)

## 2017-11-05 LAB — CBC
HEMATOCRIT: 36.1 % (ref 36.0–46.0)
Hemoglobin: 11.5 g/dL — ABNORMAL LOW (ref 12.0–15.0)
MCH: 28.7 pg (ref 26.0–34.0)
MCHC: 31.9 g/dL (ref 30.0–36.0)
MCV: 90 fL (ref 78.0–100.0)
PLATELETS: 238 10*3/uL (ref 150–400)
RBC: 4.01 MIL/uL (ref 3.87–5.11)
RDW: 16.4 % — AB (ref 11.5–15.5)
WBC: 6.6 10*3/uL (ref 4.0–10.5)

## 2017-11-05 LAB — LIPASE, BLOOD: Lipase: 27 U/L (ref 11–51)

## 2017-11-05 LAB — I-STAT TROPONIN, ED: Troponin i, poc: 0.01 ng/mL (ref 0.00–0.08)

## 2017-11-05 MED ORDER — HYDROCODONE-ACETAMINOPHEN 7.5-325 MG PO TABS: 1.0000 | ORAL_TABLET | Freq: Four times a day (QID) | ORAL | 0 refills | Status: DC | PRN

## 2017-11-05 MED ORDER — MORPHINE SULFATE (PF) 4 MG/ML IV SOLN
4.0000 mg | Freq: Once | INTRAVENOUS | Status: AC
  Administered 2017-11-05: 4 mg via INTRAVENOUS
  Filled 2017-11-05: qty 1

## 2017-11-05 MED ORDER — SODIUM CHLORIDE 0.9 % IV BOLUS (SEPSIS)
1000.0000 mL | Freq: Once | INTRAVENOUS | Status: AC
  Administered 2017-11-05: 1000 mL via INTRAVENOUS

## 2017-11-05 NOTE — ED Triage Notes (Signed)
Pt in via [facility] from home, pt reported to have hx of colon surgeries d/t CA, per report pt had office visit this upcoming week but the RLQ pain was too bad, pt denies n/v/d, pt rcvd 100 mcg Fentanyl PTA, pt in A fib in route, pt A&O x4

## 2017-11-05 NOTE — Discharge Instructions (Signed)
Take tylenol for pain.   Take hydrocodone for severe pain.   Follow up with Dr. Marlou Starks on Tuesday as scheduled   Return to ER if you have worse abdominal pain, vomiting, fever, trouble urinating

## 2017-11-05 NOTE — ED Provider Notes (Signed)
[facility] EMERGENCY DEPARTMENT Provider Note   CSN: [id number] Arrival date & time: 11/05/17  1145     History   Chief Complaint Chief Complaint  Patient presents with  . Abdominal Pain    HPI [name] is a 83 y.o. female history of colon cancer, perforated diverticulitis status post drain that was removed several months ago, COPD, Takasubo, A. fib on Coumadin here presenting with abdominal pain.  Patient states that she has acute onset of right upper quadrant and right flank pain radiated to the right groin this morning.  Felt nauseated but had no vomiting or diarrhea.  Patient states that she had previous diverticulitis that ruptured and required a drain back in October.  She follows up with Dr. Marlou Starks from surgery and was seen at cardiology and PCP office last week for preop clearance for colon surgery. Her last INR was 3.2 several days ago. Given 100 mcg of fentanyl by EMS en route.   The history is provided by the patient.    Past Medical History:  Diagnosis Date  . Allergy    rhinitis  . Aortic stenosis    mild by echo 06/2017  . Arthritis   . Bradycardia    a. 10/2017 -> beta blocker cut back due to HR 39.  . Breast cancer ([place]) 01/06/2012  . Cancer [facility])    right colon and left breast  . Chronic diastolic CHF (congestive heart failure) ([place])   . Colon cancer ([place]) 01/06/2012  . COPD (chronic obstructive pulmonary disease) ([place])   . Coronary artery disease 2006   a.  NSTEMI in 2016, cath showed 15% prox-mid RCA, 20% prox LAD, EF 25-35% by cath and 35-40% -> felt due to Takotsubo cardiomyopathy.  . Dilated aortic root ([place])    16mmHg by echo 06/2017  . Diverticulosis   . Edema extremities   . GERD (gastroesophageal reflux disease)   . Hernia   . Hiatal hernia    denies  . Hyperlipidemia   . Hypertension   . Mild aortic stenosis    echo 11/2015 but not noted on echo 06/2016  . Osteopenia   . Permanent atrial fibrillation ([facility])    chronic atrial  fibrillation  . Pneumonia    hx child  . Pulmonary HTN ([place])    a. moderate to severe PASP 7mmHg echo 11/2015 - now 55mmHg by echo 06/2017. CTA chest in 11/16 with no PE. PFTs in 7/15 with mild obstructive lung disease. She had a negative sleep study in 2017. b. Felt due to left sided HF.  . Takotsubo syndrome 07/29/2015   a. EF 35-40% by echo; akinesis of mid-apical anteroseptal and apical myocardium.  EF now normalized on echo 11/2015    Patient Active Problem List   Diagnosis Date Noted  . Breast cancer ([place]) 09/13/2017  . Obesity, unspecified 09/13/2017  . Warfarin anticoagulation 09/13/2017  . Uterine mass 09/07/2017  . Adnexal mass 08/03/2017  . Pelvic abscess in female 07/07/2017  . Aortic stenosis   . Dyslipidemia, goal LDL below 70 11/28/2016  . Monitoring for long-term anticoagulant use 12/31/2015  . Chronic diastolic CHF (congestive heart failure) ([place]) 10/28/2015  . Insomnia 10/23/2015  . Leg swelling 08/11/2015  . Takotsubo syndrome 07/29/2015  . NSTEMI (non-ST elevated myocardial infarction) ([place]) 07/28/2015  . SOB (shortness of breath) 08/15/2014  . Dilated aortic root ([place])   . Pulmonary HTN ([place])   . Permanent atrial fibrillation ([place]) 07/04/2013  . Chronic anticoagulation 07/04/2013  .  Coronary artery disease   . Lesion of nipple 05/15/2013  . GERD (gastroesophageal reflux disease)   . Hypertension   . COPD (chronic obstructive pulmonary disease) ([place])   . Osteopenia   . Diverticulosis   . Hiatal hernia   . Abdominal pain 01/11/2012  . Breast cancer of upper-outer quadrant of left female breast ([place]) 01/06/2012  . Colon cancer ([place]) 01/06/2012    Past Surgical History:  Procedure Laterality Date  . APPENDECTOMY    . BREAST SURGERY     lumpectomy left  . CARDIAC CATHETERIZATION    . CARDIAC CATHETERIZATION N/A 07/28/2015   Procedure: Left Heart Cath and Coronary Angiography;  Surgeon: Peter M Martinique, MD;  Location: [facility] CV [facility];  Service:  Cardiovascular;  Laterality: N/A;  . CHOLECYSTECTOMY    . COLECTOMY     right side  . EXCISION OF ACCESSORY NIPPLE Bilateral 05/30/2013   Procedure: BILATERAL NIPPLE BIOPSY;  Surgeon: Merrie Roof, MD;  Location: [facility];  Service: General;  Laterality: Bilateral;  . EYE SURGERY Bilateral 14   cataracts  . IR RADIOLOGIST EVAL & MGMT  07/26/2017  . MASTECTOMY PARTIAL / LUMPECTOMY  2008   left breast  . SPLIT NIGHT STUDY  02/02/2016        OB History    Gravida Para Term Preterm AB Living   2 1 1   1 1    SAB TAB Ectopic Multiple Live Births   1       1       Home Medications    Prior to Admission medications   Medication Sig Start Date End Date Taking? Authorizing Provider  acetaminophen (TYLENOL) 500 MG tablet Take 500-1,000 mg by mouth every 6 (six) hours as needed for headache (pain).   Yes [provider]  amLODipine (NORVASC) 5 MG tablet TAKE ONE TABLET BY MOUTH EVERY DAY Patient taking differently: TAKE ONE TABLET (5 MG)BY MOUTH EVERY DAY 07/22/17  Yes Larey Dresser, MD  augmented betamethasone dipropionate (DIPROLENE-AF) 0.05 % ointment Apply 1 application topically 2 (two) times daily as needed (rash/dry skin).    Yes [provider]  cetirizine (ZYRTEC) 10 MG tablet Take 10 mg by mouth daily as needed for allergies.   Yes [provider]  clonazePAM (KLONOPIN) 0.5 MG tablet TAKE 1 TABLET BY MOUTH TWICE DAILY AS NEEDED FOR ANXIETY Patient taking differently: TAKE 1 TABLET (0.5 MG) BY MOUTH TWICE DAILY AS NEEDED FOR ANXIETY 09/26/17  Yes Dixon, Mary B, PA-C  cloNIDine (CATAPRES) 0.1 MG tablet TAKE ONE TABLET BY MOUTH TWICE DAILY Patient taking differently: TAKE ONE TABLET (0.1 MG) BY MOUTH TWICE DAILY 12/14/16  Yes Turner, Eber Hong, MD  Guaifenesin (MUCINEX MAXIMUM STRENGTH) 1200 MG TB12 Take 1,200 mg by mouth daily as needed (congestion).   Yes [provider]  HYDROcodone-acetaminophen (NORCO) 7.5-325 MG tablet Take 1 tablet by mouth  every 6 (six) hours as needed for severe pain. Patient taking differently: Take 1 tablet by mouth 2 (two) times daily as needed for severe pain (pain).  10/07/17  Yes Dena Billet B, PA-C  ketoconazole (NIZORAL) 2 % cream Apply to affected area twice daily. Patient taking differently: Apply 1 application topically 2 (two) times daily as needed for irritation (rash/dry skin).  09/14/17  Yes Hyatt, Max T, DPM  losartan (COZAAR) 100 MG tablet TAKE ONE TABLET BY MOUTH EVERY DAY Patient taking differently: TAKE ONE TABLET (100 MG) BY MOUTH EVERY DAY 07/22/17  Yes Loralie Champagne  S, MD  metoprolol succinate (TOPROL XL) 25 MG 24 hr tablet Take 1 tablet (25 mg total) by mouth daily. 10/25/17  Yes Duke, Tami Lin, PA  mirtazapine (REMERON) 15 MG tablet TAKE 1 TABLET BY MOUTH AT BEDTIME Patient taking differently: TAKE 1 TABLET (15 MG) BY MOUTH AT BEDTIME 08/01/17  Yes Dixon, Mary B, PA-C  nitroGLYCERIN (NITROSTAT) 0.4 MG SL tablet PLACE ONE TABLET UNDER THE TONGUE EVERY FIVE MINUTES AS NEEDED FOR CHEST PAIN. [place] REPEAT FOR THREE DOSES. Patient taking differently: PLACE ONE TABLET (0.4 MG) UNDER THE TONGUE EVERY FIVE MINUTES AS NEEDED FOR CHEST PAIN. [place] REPEAT FOR THREE DOSES. 09/01/17  Yes Orlena Sheldon, PA-C  omeprazole (PRILOSEC OTC) 20 MG tablet Take 20 mg by mouth daily with supper.   Yes [provider]  OVER THE COUNTER MEDICATION Place 1 drop into both eyes daily as needed (dry eyes). Over the counter lubricating eye drops   Yes [provider]  spironolactone (ALDACTONE) 25 MG tablet TAKE ONE-HALF TABLET BY MOUTH ONCE DAILY Patient taking differently: TAKE ONE-HALF TABLET (12.5 MG) BY MOUTH ONCE DAILY 12/28/16  Yes Turner, Traci R, MD  torsemide (DEMADEX) 20 MG tablet Take 60 mg by mouth daily.    Yes [provider]  warfarin (COUMADIN) 1 MG tablet Take 0.5 tablets (0.5 mg total) by mouth as directed. M-W-F-SA Patient taking differently: Take 0.5 mg by mouth See admin  instructions. Take 1/2 tablet (0.5 mg) by mouth at bedtime Monday, Wednesday, Friday, Saturday (see additional notes for 5 mg tablet) 07/08/16  Yes Dena Billet B, PA-C  warfarin (COUMADIN) 5 MG tablet TAKE ONE-HALF TABLET (2.5MG ) BY MOUTH DAILY. (ON MONDAY, WEDNESDAY, FRIDAY ANDSATURDAY, ADD ONE-HALF TABLET OF WARFARIN 1MG ). Patient taking differently: TAKE 1/2 TABLET (2.5 MG) BY MOUTH DAILY AT BEDTIME (ADD 1/2 1 MG TABLET (0.5 MG) ON MON, WED, FRI, SAT) 09/08/17  Yes Dixon, Mary B, PA-C  triamcinolone cream (KENALOG) 0.1 % Apply 1 application topically 2 (two) times daily. Patient not taking: Reported on 11/05/2017 12/31/16   Alycia Rossetti, MD    Family History Family History  Problem Relation Age of Onset  . Heart disease Mother   . Heart attack Mother   . Cancer Sister        stomach and colon  . Heart disease Brother 76  . Hypertension Father   . Cancer Sister   . Stroke Neg Hx     Social History Social History   Tobacco Use  . Smoking status: Never Smoker  . Smokeless tobacco: Never Used  Substance Use Topics  . Alcohol use: No  . Drug use: No     Allergies   Contrast media [iodinated diagnostic agents]; Clonidine derivatives; Statins; Sulfa antibiotics; Celebrex [celecoxib]; Isosorbide nitrate; Other; and Tape   Review of Systems Review of Systems  Gastrointestinal: Positive for abdominal pain.  All other systems reviewed and are negative.    Physical Exam Updated Vital Signs BP (!) 156/55   Pulse (!) 50   Temp 98 F (36.7 C) (Oral)   Resp 15   Ht 5\' 6"  (1.676 m)   Wt 90.7 kg (200 lb)   SpO2 96%   BMI 32.28 kg/m   Physical Exam  Constitutional:  Chronically ill, uncomfortable   HENT:  Head: Normocephalic.  Mouth/Throat: Oropharynx is clear and moist.  Eyes: Pupils are equal, round, and reactive to light.  Cardiovascular: Normal rate.  Pulmonary/Chest: Effort normal.  Abdominal: Soft. Normal appearance.  Mild R  CVAT and RUQ tenderness, no rebound.  No colostomy visible and previous drain site healing well   Neurological: She is alert.  Skin: Skin is warm. Capillary refill takes less than 2 seconds.  Psychiatric: She has a normal mood and affect.  Nursing note and vitals reviewed.    ED Treatments / Results  Labs (all labs ordered are listed, but only abnormal results are displayed) Labs Reviewed  COMPREHENSIVE METABOLIC PANEL - Abnormal; Notable for the following components:      Result Value   Chloride 100 (*)    Glucose, Bld 116 (*)    GFR calc non Af Amer 52 (*)    All other components within normal limits  CBC - Abnormal; Notable for the following components:   Hemoglobin 11.5 (*)    RDW 16.4 (*)    All other components within normal limits  PROTIME-INR - Abnormal; Notable for the following components:   Prothrombin Time 23.9 (*)    All other components within normal limits  LIPASE, BLOOD  URINALYSIS, ROUTINE W REFLEX MICROSCOPIC  I-STAT TROPONIN, ED    EKG  EKG Interpretation  Date/Time:  Saturday November 05 2017 12:33:43 EST Ventricular Rate:  65 PR Interval:    QRS Duration: 96 QT Interval:  457 QTC Calculation: 476 R Axis:   44 Text Interpretation:  Atrial fibrillation Anterior infarct, old No significant change since last tracing Confirmed by Wandra Arthurs [phone]) on 11/05/2017 1:05:28 PM       Radiology Ct Abdomen Pelvis Wo Contrast  Result Date: 11/05/2017 CLINICAL DATA:  Acute right lower quadrant abdominal pain. EXAM: CT ABDOMEN AND PELVIS WITHOUT CONTRAST TECHNIQUE: Multidetector CT imaging of the abdomen and pelvis was performed following the standard protocol without IV contrast. COMPARISON:  CT scan of September 20, 2017. FINDINGS: Lower chest: No acute abnormality. Hepatobiliary: No focal liver abnormality is seen. Status post cholecystectomy. No biliary dilatation. Pancreas: Unremarkable. No pancreatic ductal dilatation or surrounding inflammatory changes. Spleen: Stable calcified splenic granulomata  are noted. Adrenals/Urinary Tract: Adrenal glands are unremarkable. Kidneys are normal, without renal calculi, focal lesion, or hydronephrosis. Bladder is unremarkable. Stomach/Bowel: Status post right colectomy. Stomach is unremarkable. Sigmoid diverticulosis is noted. There is seen irregular gas collection extending from left adnexal region along tract of previous drainage catheter toward the left anterior abdominal wall, were gas is present as well. This is concerning for possible fistulous connection extending from sigmoid colon to the skin surface. Vascular/Lymphatic: Aortic atherosclerosis. No enlarged abdominal or pelvic lymph nodes. Reproductive: Uterus and right adnexal regions are unremarkable. As noted previously, air collection is seen extending from left adnexal region which is adjacent to sigmoid colon, and toward the anterior skin surface of the left lower quadrant. Other: No abdominal wall hernia or abnormality. No abdominopelvic ascites. Musculoskeletal: No acute or significant osseous findings. IMPRESSION: Gas is seen tracking along previous site of drainage catheter extending from left adnexal region, which is adjacent to sigmoid colon, to the anterior abdominal wall and subcutaneous tissues of the left lower quadrant anteriorly. 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 Status post right hemicolectomy. Sigmoid diverticulosis is noted without evidence of acute inflammation at this time. Aortic Atherosclerosis (ICD10-I70.0). Electronically Signed   By: Marijo Conception, M.D.   On: 11/05/2017 14:19    Procedures Procedures (including critical care time)  Medications Ordered in ED Medications  sodium chloride 0.9 % bolus 1,000 mL (0 mLs Intravenous Stopped 11/05/17 1356)  morphine 4 MG/ML injection 4 mg (4 mg Intravenous  Given 11/05/17 1523)     Initial Impression / Assessment and Plan / ED Course  I have reviewed the triage vital signs and the nursing notes.  Pertinent labs &  imaging results that were available during my care of the patient were reviewed by me and considered in my medical decision making (see chart for details).     Eldora Napp Pomeroy is a 82 y.o. female here with abdominal pain. Hx of diverticulitis s/p drain. Plan for colon surgery but she is on coumadin and has multiple medical problems. Will get labs, UA, CT ab/pel to assess.   3 pm Labs unremarkable. UA unremarkable. 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 No obvious drainage currently. No abscess on CT. Consulted surgery.   5:45 PM Dr. Kae Heller reviewed case. 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 Has no abscess. WBC nl, no signs of infection. She has follow up with Dr. Marlou Starks in 3 days. She recommend symptomatic pain control and outpatient follow up as scheduled and no emergent surgical intervention. Will dc home with hydrocodone for pain.    Final Clinical Impressions(s) / ED Diagnoses   Final diagnoses:  None    ED Discharge Orders    None       Drenda Freeze, MD 11/05/17 1746

## 2017-11-08 ENCOUNTER — Other Ambulatory Visit: Payer: Self-pay | Admitting: Physician Assistant

## 2017-11-08 ENCOUNTER — Ambulatory Visit: Payer: Self-pay | Admitting: General Surgery

## 2017-11-08 DIAGNOSIS — K5732 Diverticulitis of large intestine without perforation or abscess without bleeding: Secondary | ICD-10-CM | POA: Diagnosis not present

## 2017-11-08 DIAGNOSIS — G4700 Insomnia, unspecified: Secondary | ICD-10-CM

## 2017-11-09 ENCOUNTER — Telehealth: Payer: Self-pay | Admitting: *Deleted

## 2017-11-09 NOTE — Telephone Encounter (Signed)
Sending to our prior auth dept to address which blood thinner will be the most cost effective for the patient.

## 2017-11-09 NOTE — Telephone Encounter (Signed)
Called and spoke with the patient, gave the new patient appt for March 11th at 9:30am

## 2017-11-10 ENCOUNTER — Telehealth: Payer: Self-pay | Admitting: *Deleted

## 2017-11-10 NOTE — Telephone Encounter (Signed)
Forwarding to Melina Copa, PA for her Juluis Rainier

## 2017-11-10 NOTE — Telephone Encounter (Signed)
Regarding note dated 11/04/2017. I called [employer] to get cost of xarelto vs eliquis. They are both tier 3 and will cost $37.00/month. I called patient and gave her this information, she states she will stay on coumadin for now because shes worried about going in donut hole with these medications. She chooses to continue coumadin.

## 2017-11-10 NOTE — Telephone Encounter (Signed)
Thanks for the update. Kedarius Aloisi PA-C

## 2017-11-14 ENCOUNTER — Inpatient Hospital Stay: Payer: Medicare Other | Attending: Gynecologic Oncology | Admitting: Gynecologic Oncology

## 2017-11-14 ENCOUNTER — Encounter: Payer: Self-pay | Admitting: Gynecologic Oncology

## 2017-11-14 VITALS — BP 157/65 | HR 49 | Temp 97.9°F | Resp 18 | Ht 66.0 in | Wt 198.6 lb

## 2017-11-14 DIAGNOSIS — Z853 Personal history of malignant neoplasm of breast: Secondary | ICD-10-CM

## 2017-11-14 DIAGNOSIS — Z85038 Personal history of other malignant neoplasm of large intestine: Secondary | ICD-10-CM | POA: Insufficient documentation

## 2017-11-14 DIAGNOSIS — Z7901 Long term (current) use of anticoagulants: Secondary | ICD-10-CM | POA: Diagnosis not present

## 2017-11-14 DIAGNOSIS — R971 Elevated cancer antigen 125 [CA 125]: Secondary | ICD-10-CM | POA: Diagnosis not present

## 2017-11-14 DIAGNOSIS — K5792 Diverticulitis of intestine, part unspecified, without perforation or abscess without bleeding: Secondary | ICD-10-CM | POA: Diagnosis not present

## 2017-11-14 DIAGNOSIS — I482 Chronic atrial fibrillation: Secondary | ICD-10-CM | POA: Diagnosis not present

## 2017-11-14 DIAGNOSIS — Z9049 Acquired absence of other specified parts of digestive tract: Secondary | ICD-10-CM | POA: Insufficient documentation

## 2017-11-14 NOTE — Progress Notes (Signed)
Consult Note: Gyn-Onc  Consult was requested by Dr. Marlou Starks for the evaluation of [name] 82 y.o. female  CC:  Chief Complaint  Patient presents with  . Elevated CA-125    Assessment/Plan:  [name]  is a 83 y.o.  year old with diverticulitis and elevated CA 125. The CA 125 has fallen some since original testing which is very reassuring. Additionally, diverticulitis is a reasonable cause for elevated CA 125. However, given her CT findings it is reasonable that we can evaluate the adnexa during surgery. I will make myself available to Dr Marlou Starks for this.  I discussed that pending the findings we Fitterer need to perform an LSO (if the ovary is attached to the necrotic mass). The uterus, cervix and both tubes and ovaries, possibly nodes and possibly omentum will be removed if a cancer is identified.   HPI: [name] is a 83 year old parous woman who is seen in consultation at the request of Dr Marlou Starks for left adnexal fullness and elevated CA 125.   The patient has a history of hospitalization for sigmoid diverticulitis with abscess requiring percutaneous drainage.  CT scan of the abdomen and pelvis on November 05, 2017 revealed status post right colectomy.  Sigmoid diverticulosis is noted.  There is irregular gas collection extending from the left adnexal region along the track of previous drainage catheter along the left anterior abdominal wall.  The uterus and right adnexal regions are unremarkable.  There is an air collection seen extending from the left adnexal region adjacent to the sigmoid colon towards the anterior skin surface of the left lower quadrant.  The patient is not draining stool from the abdomen.  She has been intimately on antibiotics since her diverticulitis episode.  She has some left lower quadrant pains.  She has a remote history of right-sided colon cancer for which she has had a partial colectomy in 2003 via laparotomy.  She takes Coumadin for atrial fibrillation.  CA 125 on  07/19/17 was 97, and repeated on 09/07/17 was 52.  Her family history is significant for a history of colon cancer.   Current Meds:  Outpatient Encounter Medications as of 11/14/2017  Medication Sig  . acetaminophen (TYLENOL) 500 MG tablet Take 500-1,000 mg by mouth every 6 (six) hours as needed for headache (pain).  Marland Kitchen amLODipine (NORVASC) 5 MG tablet TAKE ONE TABLET BY MOUTH EVERY DAY (Patient taking differently: TAKE ONE TABLET (5 MG)BY MOUTH EVERY DAY)  . augmented betamethasone dipropionate (DIPROLENE-AF) 0.05 % ointment Apply 1 application topically 2 (two) times daily as needed (rash/dry skin).   . cetirizine (ZYRTEC) 10 MG tablet Take 10 mg by mouth daily as needed for allergies.  . clonazePAM (KLONOPIN) 0.5 MG tablet TAKE 1 TABLET BY MOUTH TWICE DAILY AS NEEDED FOR ANXIETY (Patient taking differently: TAKE 1 TABLET (0.5 MG) BY MOUTH TWICE DAILY AS NEEDED FOR ANXIETY)  . cloNIDine (CATAPRES) 0.1 MG tablet TAKE ONE TABLET BY MOUTH TWICE DAILY (Patient taking differently: TAKE ONE TABLET (0.1 MG) BY MOUTH TWICE DAILY)  . Guaifenesin (MUCINEX MAXIMUM STRENGTH) 1200 MG TB12 Take 1,200 mg by mouth daily as needed (congestion).  Marland Kitchen HYDROcodone-acetaminophen (NORCO) 7.5-325 MG tablet Take 1 tablet by mouth every 6 (six) hours as needed for severe pain.  Marland Kitchen losartan (COZAAR) 100 MG tablet TAKE ONE TABLET BY MOUTH EVERY DAY (Patient taking differently: TAKE ONE TABLET (100 MG) BY MOUTH EVERY DAY)  . metoprolol succinate (TOPROL XL) 25 MG 24 hr tablet  Take 1 tablet (25 mg total) by mouth daily.  . mirtazapine (REMERON) 15 MG tablet TAKE 1 TABLET BY MOUTH AT BEDTIME  . omeprazole (PRILOSEC OTC) 20 MG tablet Take 20 mg by mouth daily with supper.  Marland Kitchen OVER THE COUNTER MEDICATION Place 1 drop into both eyes daily as needed (dry eyes). Over the counter lubricating eye drops  . spironolactone (ALDACTONE) 25 MG tablet TAKE ONE-HALF TABLET BY MOUTH ONCE DAILY (Patient taking differently: TAKE ONE-HALF TABLET  (12.5 MG) BY MOUTH ONCE DAILY)  . torsemide (DEMADEX) 20 MG tablet Take 60 mg by mouth daily.   Marland Kitchen warfarin (COUMADIN) 1 MG tablet Take 0.5 tablets (0.5 mg total) by mouth as directed. M-W-F-SA (Patient taking differently: Take 3 mg by mouth. Take 3 mg by mouth at bedtime Monday, Wednesday, Friday, Saturday (see additional notes for 5 mg tablet))  . warfarin (COUMADIN) 5 MG tablet TAKE ONE-HALF TABLET (2.5MG ) BY MOUTH DAILY. (ON MONDAY, WEDNESDAY, FRIDAY ANDSATURDAY, ADD ONE-HALF TABLET OF WARFARIN 1MG ). (Patient taking differently: 2.5 mg Sun,T,TH)  . nitroGLYCERIN (NITROSTAT) 0.4 MG SL tablet PLACE ONE TABLET UNDER THE TONGUE EVERY FIVE MINUTES AS NEEDED FOR CHEST PAIN. [place] REPEAT FOR THREE DOSES. (Patient not taking: Reported on 11/14/2017)  . [DISCONTINUED] ketoconazole (NIZORAL) 2 % cream Apply to affected area twice daily. (Patient taking differently: Apply 1 application topically 2 (two) times daily as needed for irritation (rash/dry skin). )  . [DISCONTINUED] triamcinolone cream (KENALOG) 0.1 % Apply 1 application topically 2 (two) times daily.   No facility-administered encounter medications on file as of 11/14/2017.     Allergy:  Allergies  Allergen Reactions  . Contrast Media [Iodinated Diagnostic Agents] Hives and Other (See Comments)    Per pt strong burning sensation starting in chest radiating outward   . Clonidine Derivatives Other (See Comments)    Throat dry  . Statins Other (See Comments)    "bones hurt"  . Sulfa Antibiotics Diarrhea    Tremors   . Celebrex [Celecoxib] Rash  . Isosorbide Nitrate Itching and Rash  . Other Itching and Rash    Plastic and paper tape and heart monitor pads  . Tape Itching and Rash    Red Where applied and will spread    Social Hx:   Social History   Socioeconomic History  . Marital status: Married    Spouse name: Not on file  . Number of children: Not on file  . Years of education: Not on file  . Highest education level: Not on file   Social Needs  . Financial resource strain: Not on file  . Food insecurity - worry: Not on file  . Food insecurity - inability: Not on file  . Transportation needs - medical: Not on file  . Transportation needs - non-medical: Not on file  Occupational History  . Not on file  Tobacco Use  . Smoking status: Never Smoker  . Smokeless tobacco: Never Used  Substance and Sexual Activity  . Alcohol use: No  . Drug use: No  . Sexual activity: Not Currently  Other Topics Concern  . Not on file  Social History Narrative  . Not on file    Past Surgical Hx:  Past Surgical History:  Procedure Laterality Date  . APPENDECTOMY    . BREAST SURGERY     lumpectomy left  . CARDIAC CATHETERIZATION    . CARDIAC CATHETERIZATION N/A 07/28/2015   Procedure: Left Heart Cath and Coronary Angiography;  Surgeon: Peter M Martinique, MD;  Location: [facility];  Service: Cardiovascular;  Laterality: N/A;  . CHOLECYSTECTOMY    . COLECTOMY     right side  . EXCISION OF ACCESSORY NIPPLE Bilateral 05/30/2013   Procedure: BILATERAL NIPPLE BIOPSY;  Surgeon: Merrie Roof, MD;  Location: [facility];  Service: General;  Laterality: Bilateral;  . EYE SURGERY Bilateral 12   cataracts  . IR RADIOLOGIST EVAL & MGMT  07/26/2017  . MASTECTOMY PARTIAL / LUMPECTOMY  2008   left breast  . SPLIT NIGHT STUDY  02/02/2016        Past Medical Hx:  Past Medical History:  Diagnosis Date  . Allergy    rhinitis  . Aortic stenosis    mild by echo 06/2017  . Arthritis   . Bradycardia    a. 10/2017 -> beta blocker cut back due to HR 39.  . Breast cancer ([place]) 01/06/2012  . Cancer [facility])    right colon and left breast  . Chronic diastolic CHF (congestive heart failure) ([place])   . Colon cancer ([place]) 01/06/2012  . COPD (chronic obstructive pulmonary disease) ([place])   . Coronary artery disease 2006   a.  NSTEMI in 2016, cath showed 15% prox-mid RCA, 20% prox LAD, EF 25-35% by cath and 35-40% -> felt due to Takotsubo  cardiomyopathy.  . Dilated aortic root ([place])    33mmHg by echo 06/2017  . Diverticulosis   . Edema extremities   . GERD (gastroesophageal reflux disease)   . Hernia   . Hiatal hernia    denies  . Hyperlipidemia   . Hypertension   . Mild aortic stenosis    echo 11/2015 but not noted on echo 06/2016  . Osteopenia   . Permanent atrial fibrillation (HCC)    chronic atrial fibrillation  . Pneumonia    hx child  . Pulmonary HTN ([place])    a. moderate to severe PASP 59mmHg echo 11/2015 - now 55mmHg by echo 06/2017. CTA chest in 11/16 with no PE. PFTs in 7/15 with mild obstructive lung disease. She had a negative sleep study in 2017. b. Felt due to left sided HF.  . Takotsubo syndrome 07/29/2015   a. EF 35-40% by echo; akinesis of mid-apical anteroseptal and apical myocardium.  EF now normalized on echo 11/2015    Past Gynecological History:   No LMP recorded. Patient is postmenopausal.  Family Hx:  Family History  Problem Relation Age of Onset  . Heart disease Mother   . Heart attack Mother   . Cancer Sister        stomach and colon  . Heart disease Brother 66  . Hypertension Father   . Cancer Sister   . Stroke Neg Hx     Review of Systems:  Constitutional  Feels unwell with pain  ENT Normal appearing ears and nares bilaterally Skin/Breast  No rash, sores, jaundice, itching, dryness Cardiovascular  No chest pain, shortness of breath, or edema  Pulmonary  No cough or wheeze.  Gastro Intestinal  No nausea, vomitting, or diarrhoea. No bright red blood per rectum, +no abdominal pain, no change in bowel movement, or constipation.  Genito Urinary  No frequency, urgency, dysuria, + pelvic pain, no bleeding Musculo Skeletal  No myalgia, arthralgia, joint swelling or pain  Neurologic  No weakness, numbness, change in gait,  Psychology  No depression, anxiety, insomnia.   Vitals:  Blood pressure (!) 157/65, pulse (!) 49, temperature 97.9 F (36.6 C), temperature source Oral,  resp. rate 18,  height 5\' 6"  (1.676 m), weight 198 lb 9.6 oz (90.1 kg), SpO2 97 %.  Physical Exam: WD in NAD Neck  Supple NROM, without any enlargements.  Lymph Node Survey No cervical supraclavicular or inguinal adenopathy Cardiovascular  Pulse normal rate, regularity and rhythm. S1 and S2 normal.  Lungs  Clear to auscultation bilateraly, without wheezes/crackles/rhonchi. Good air movement.  Skin  No rash/lesions/breakdown  Psychiatry  Alert and oriented to person, place, and time  Abdomen  Normoactive bowel sounds, abdomen soft, non-tender and obese without evidence of hernia.  Back No CVA tenderness Genito Urinary  Vulva/vagina: Normal external female genitalia.  No lesions. No discharge or bleeding.  Bladder/urethra:  No lesions or masses, well supported bladder  Vagina: normal  Cervix: Normal appearing, no lesions.  Uterus:  Small, mobile, no parametrial involvement or nodularity.  Adnexa: no discrete masses. There is generalized tenderness Rectal  deferred Extremities  No bilateral cyanosis, clubbing or edema.   Thereasa Solo, MD  11/14/2017, 4:44 PM

## 2017-11-14 NOTE — Patient Instructions (Signed)
Dr Denman George will assist Dr Marlou Starks during his surgery. She will evaluate the ovaries for possible cancer, and if it is present, will perform a total hysterectomy with removal of both tubes and ovaries and possible staging or debulking surgery. If the left ovary is stuck onto the colon, she [place] need to remove this with the colon specimen, even if there is no cancer. If all of the reproductive organs (uterus, tubes and ovaries) are normal, she will not remove any of them.

## 2017-11-15 ENCOUNTER — Ambulatory Visit: Payer: Medicare Other | Admitting: Family Medicine

## 2017-11-15 ENCOUNTER — Encounter: Payer: Self-pay | Admitting: Family Medicine

## 2017-11-15 VITALS — BP 120/52 | HR 54 | Temp 97.8°F | Resp 14 | Ht 67.0 in | Wt 203.0 lb

## 2017-11-15 DIAGNOSIS — K5792 Diverticulitis of intestine, part unspecified, without perforation or abscess without bleeding: Secondary | ICD-10-CM

## 2017-11-15 DIAGNOSIS — R1032 Left lower quadrant pain: Secondary | ICD-10-CM | POA: Diagnosis not present

## 2017-11-15 MED ORDER — CIPROFLOXACIN HCL 500 MG PO TABS: 500.0000 mg | ORAL_TABLET | Freq: Two times a day (BID) | ORAL | 0 refills | Status: DC

## 2017-11-15 MED ORDER — METRONIDAZOLE 500 MG PO TABS: 500.0000 mg | ORAL_TABLET | Freq: Two times a day (BID) | ORAL | 0 refills | Status: DC

## 2017-11-15 NOTE — Progress Notes (Signed)
Subjective:    Patient ID: [name], female    DOB: 10-31-1934, 83 y.o.   MRN: [id number]  HPI  Was seen in the emergency room on March 2.  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  Patient previously had a percutaneous drain in the left lower quadrant for diverticulitis in November.  This had been removed.  There is possible fistula formation confirmed on recent CT in the emergency room however at that time there was no evidence of diverticulitis.  Patient has been scheduled for elective surgery under the care of Dr. Marlou Starks for removal of the inflamed portion of the sigmoid colon per her report.  Dr. Denman George with gynecologic is also planning to assist to evaluate fullness in the left adnexa.  This surgery is being planned and has not yet been scheduled.  However over the last week the pain has intensified in the left lower quadrant.  She is now extremely tender to palpation in that area.  Pain is radiating in a bandlike pattern along the lower abdomen and is also radiating into her back.  She denies any fevers but does report diarrhea and nausea. Past Medical History:  Diagnosis Date  . Allergy    rhinitis  . Aortic stenosis    mild by echo 06/2017  . Arthritis   . Bradycardia    a. 10/2017 -> beta blocker cut back due to HR 39.  . Breast cancer ([place]) 01/06/2012  . Cancer [facility])    right colon and left breast  . Chronic diastolic CHF (congestive heart failure) ([place])   . Colon cancer ([place]) 01/06/2012  . COPD (chronic obstructive pulmonary disease) ([place])   . Coronary artery disease 2006   a.  NSTEMI in 2016, cath showed 15% prox-mid RCA, 20% prox LAD, EF 25-35% by cath and 35-40% -> felt due to Takotsubo cardiomyopathy.  . Dilated aortic root ([place])    90mmHg by echo 06/2017  . Diverticulosis   . Edema extremities   . GERD (gastroesophageal reflux disease)   . Hernia   . Hiatal hernia    denies  . Hyperlipidemia   . Hypertension     . Mild aortic stenosis    echo 11/2015 but not noted on echo 06/2016  . Osteopenia   . Permanent atrial fibrillation (HCC)    chronic atrial fibrillation  . Pneumonia    hx child  . Pulmonary HTN ([place])    a. moderate to severe PASP 72mmHg echo 11/2015 - now 39mmHg by echo 06/2017. CTA chest in 11/16 with no PE. PFTs in 7/15 with mild obstructive lung disease. She had a negative sleep study in 2017. b. Felt due to left sided HF.  . Takotsubo syndrome 07/29/2015   a. EF 35-40% by echo; akinesis of mid-apical anteroseptal and apical myocardium.  EF now normalized on echo 11/2015   Past Surgical History:  Procedure Laterality Date  . APPENDECTOMY    . BREAST SURGERY     lumpectomy left  . CARDIAC CATHETERIZATION    . CARDIAC CATHETERIZATION N/A 07/28/2015   Procedure: Left Heart Cath and Coronary Angiography;  Surgeon: Peter M Martinique, MD;  Location: [facility];  Service: Cardiovascular;  Laterality: N/A;  . CHOLECYSTECTOMY    . COLECTOMY     right side  . EXCISION OF ACCESSORY NIPPLE Bilateral 05/30/2013   Procedure: BILATERAL NIPPLE BIOPSY;  Surgeon: Merrie Roof, MD;  Location: [facility]  OR;  Service: General;  Laterality: Bilateral;  . EYE SURGERY Bilateral 12   cataracts  . IR RADIOLOGIST EVAL & MGMT  07/26/2017  . MASTECTOMY PARTIAL / LUMPECTOMY  2008   left breast  . SPLIT NIGHT STUDY  02/02/2016       Current Outpatient Medications on File Prior to Visit  Medication Sig Dispense Refill  . acetaminophen (TYLENOL) 500 MG tablet Take 500-1,000 mg by mouth every 6 (six) hours as needed for headache (pain).    Marland Kitchen amLODipine (NORVASC) 5 MG tablet TAKE ONE TABLET BY MOUTH EVERY DAY (Patient taking differently: TAKE ONE TABLET (5 MG)BY MOUTH EVERY DAY) 30 tablet 3  . augmented betamethasone dipropionate (DIPROLENE-AF) 0.05 % ointment Apply 1 application topically 2 (two) times daily as needed (rash/dry skin).     . cetirizine (ZYRTEC) 10 MG tablet Take 10 mg by mouth daily as needed  for allergies.    . clonazePAM (KLONOPIN) 0.5 MG tablet TAKE 1 TABLET BY MOUTH TWICE DAILY AS NEEDED FOR ANXIETY (Patient taking differently: TAKE 1 TABLET (0.5 MG) BY MOUTH TWICE DAILY AS NEEDED FOR ANXIETY) 60 tablet 2  . cloNIDine (CATAPRES) 0.1 MG tablet TAKE ONE TABLET BY MOUTH TWICE DAILY (Patient taking differently: TAKE ONE TABLET (0.1 MG) BY MOUTH TWICE DAILY) 60 tablet 11  . Guaifenesin (MUCINEX MAXIMUM STRENGTH) 1200 MG TB12 Take 1,200 mg by mouth daily as needed (congestion).    Marland Kitchen HYDROcodone-acetaminophen (NORCO) 7.5-325 MG tablet Take 1 tablet by mouth every 6 (six) hours as needed for severe pain. 15 tablet 0  . losartan (COZAAR) 100 MG tablet TAKE ONE TABLET BY MOUTH EVERY DAY (Patient taking differently: TAKE ONE TABLET (100 MG) BY MOUTH EVERY DAY) 30 tablet 3  . metoprolol succinate (TOPROL XL) 25 MG 24 hr tablet Take 1 tablet (25 mg total) by mouth daily. 30 tablet 1  . mirtazapine (REMERON) 15 MG tablet TAKE 1 TABLET BY MOUTH AT BEDTIME 90 tablet 0  . nitroGLYCERIN (NITROSTAT) 0.4 MG SL tablet PLACE ONE TABLET UNDER THE TONGUE EVERY FIVE MINUTES AS NEEDED FOR CHEST PAIN. [place] REPEAT FOR THREE DOSES. 30 tablet 2  . omeprazole (PRILOSEC OTC) 20 MG tablet Take 20 mg by mouth daily with supper.    Marland Kitchen OVER THE COUNTER MEDICATION Place 1 drop into both eyes daily as needed (dry eyes). Over the counter lubricating eye drops    . spironolactone (ALDACTONE) 25 MG tablet TAKE ONE-HALF TABLET BY MOUTH ONCE DAILY (Patient taking differently: TAKE ONE-HALF TABLET (12.5 MG) BY MOUTH ONCE DAILY) 45 tablet 3  . torsemide (DEMADEX) 20 MG tablet Take 60 mg by mouth daily.     Marland Kitchen warfarin (COUMADIN) 1 MG tablet Take 0.5 tablets (0.5 mg total) by mouth as directed. M-W-F-SA (Patient taking differently: Take 3 mg by mouth. Take 3 mg by mouth at bedtime Monday, Wednesday, Friday, Saturday (see additional notes for 5 mg tablet)) 90 tablet 2  . warfarin (COUMADIN) 5 MG tablet TAKE ONE-HALF TABLET (2.5MG ) BY  MOUTH DAILY. (ON MONDAY, WEDNESDAY, FRIDAY ANDSATURDAY, ADD ONE-HALF TABLET OF WARFARIN 1MG ). (Patient taking differently: 2.5 mg Sun,T,TH) 90 tablet 2   No current facility-administered medications on file prior to visit.    Allergies  Allergen Reactions  . Contrast Media [Iodinated Diagnostic Agents] Hives and Other (See Comments)    Per pt strong burning sensation starting in chest radiating outward   . Clonidine Derivatives Other (See Comments)    Throat dry  . Statins Other (See Comments)    "  bones hurt"  . Sulfa Antibiotics Diarrhea    Tremors   . Celebrex [Celecoxib] Rash  . Isosorbide Nitrate Itching and Rash  . Other Itching and Rash    Plastic and paper tape and heart monitor pads  . Tape Itching and Rash    Red Where applied and will spread   Social History   Socioeconomic History  . Marital status: Married    Spouse name: Not on file  . Number of children: Not on file  . Years of education: Not on file  . Highest education level: Not on file  Social Needs  . Financial resource strain: Not on file  . Food insecurity - worry: Not on file  . Food insecurity - inability: Not on file  . Transportation needs - medical: Not on file  . Transportation needs - non-medical: Not on file  Occupational History  . Not on file  Tobacco Use  . Smoking status: Never Smoker  . Smokeless tobacco: Never Used  Substance and Sexual Activity  . Alcohol use: No  . Drug use: No  . Sexual activity: Not Currently  Other Topics Concern  . Not on file  Social History Narrative  . Not on file      Review of Systems  All other systems reviewed and are negative.      Objective:   Physical Exam  Cardiovascular: Normal rate and normal heart sounds.  Pulmonary/Chest: Effort normal and breath sounds normal.  Abdominal: Normal appearance and bowel sounds are normal. She exhibits no distension. There is tenderness in the right lower quadrant and left lower quadrant. There is no  rigidity, no rebound and no guarding.    Skin: Skin is warm.  Vitals reviewed.         Assessment & Plan:  LLQ abd pain I am concerned that the patient [place] be developing diverticulitis.  We will cover the patient with Cipro 500 mg p.o. twice daily and Flagyl 500 mg p.o. twice daily and recheck the patient on Friday.  Antibiotics will undoubtedly affect INR and I will recheck the patient's INR Friday as well and adjust her Coumadin is necessary.

## 2017-11-16 ENCOUNTER — Ambulatory Visit: Payer: Medicare Other | Admitting: Podiatry

## 2017-11-16 NOTE — Telephone Encounter (Signed)
See phone note from 11/10/17.

## 2017-11-18 ENCOUNTER — Ambulatory Visit: Payer: Medicare Other | Admitting: Family Medicine

## 2017-11-18 VITALS — BP 104/40 | HR 56 | Temp 97.8°F | Resp 16 | Ht 67.0 in | Wt 201.0 lb

## 2017-11-18 DIAGNOSIS — K5792 Diverticulitis of intestine, part unspecified, without perforation or abscess without bleeding: Secondary | ICD-10-CM

## 2017-11-18 DIAGNOSIS — R1032 Left lower quadrant pain: Secondary | ICD-10-CM

## 2017-11-18 DIAGNOSIS — I482 Chronic atrial fibrillation: Secondary | ICD-10-CM

## 2017-11-18 DIAGNOSIS — I4821 Permanent atrial fibrillation: Secondary | ICD-10-CM

## 2017-11-18 LAB — PT WITH INR/FINGERSTICK
INR, fingerstick: 3.3 ratio — ABNORMAL HIGH
PT, fingerstick: 40.2 s — ABNORMAL HIGH (ref 10.5–13.1)

## 2017-11-18 MED ORDER — HYDROCODONE-ACETAMINOPHEN 7.5-325 MG PO TABS: 1.0000 | ORAL_TABLET | Freq: Four times a day (QID) | ORAL | 0 refills | Status: DC | PRN

## 2017-11-18 NOTE — Progress Notes (Signed)
Subjective:    Patient ID: [name], female    DOB: 31-Mar-1935, 83 y.o.   MRN: [id number]  HPI  11/15/17 Was seen in the emergency room on March 2.  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  Patient previously had a percutaneous drain in the left lower quadrant for diverticulitis in November.  This had been removed.  There is possible fistula formation confirmed on recent CT in the emergency room however at that time there was no evidence of diverticulitis.  Patient has been scheduled for elective surgery under the care of Dr. Marlou Starks for removal of the inflamed portion of the sigmoid colon per her report.  Dr. Denman George with gynecologic is also planning to assist to evaluate fullness in the left adnexa.  This surgery is being planned and has not yet been scheduled.  However over the last week the pain has intensified in the left lower quadrant.  She is now extremely tender to palpation in that area.  Pain is radiating in a bandlike pattern along the lower abdomen and is also radiating into her back.  She denies any fevers but does report diarrhea and nausea.  At that time, my plan was: I am concerned that the patient [place] be developing diverticulitis.  We will cover the patient with Cipro 500 mg p.o. twice daily and Flagyl 500 mg p.o. twice daily and recheck the patient on Friday.  Antibiotics will undoubtedly affect INR and I will recheck the patient's INR Friday as well and adjust her Coumadin is necessary.  11/18/17 Here today for follow-up.  She states her belly feels considerably better than 3 days ago on Cipro and Flagyl.  Her pain has improved and she is no longer tender to palpation in the lower abdomen.  She continues to have pain but it is much better and seems to be improving on a daily basis.  She denies any fevers.  She denies any nausea or vomiting.  She has had some diarrhea recently but denies any blood in her stool.  Her INR today is  3.3 Past Medical History:  Diagnosis Date  . Allergy    rhinitis  . Aortic stenosis    mild by echo 06/2017  . Arthritis   . Bradycardia    a. 10/2017 -> beta blocker cut back due to HR 39.  . Breast cancer ([place]) 01/06/2012  . Cancer [facility])    right colon and left breast  . Chronic diastolic CHF (congestive heart failure) ([place])   . Colon cancer ([place]) 01/06/2012  . COPD (chronic obstructive pulmonary disease) ([place])   . Coronary artery disease 2006   a.  NSTEMI in 2016, cath showed 15% prox-mid RCA, 20% prox LAD, EF 25-35% by cath and 35-40% -> felt due to Takotsubo cardiomyopathy.  . Dilated aortic root ([place])    49mmHg by echo 06/2017  . Diverticulosis   . Edema extremities   . GERD (gastroesophageal reflux disease)   . Hernia   . Hiatal hernia    denies  . Hyperlipidemia   . Hypertension   . Mild aortic stenosis    echo 11/2015 but not noted on echo 06/2016  . Osteopenia   . Permanent atrial fibrillation (HCC)    chronic atrial fibrillation  . Pneumonia    hx child  . Pulmonary HTN ([place])    a. moderate to severe PASP 57mmHg echo 11/2015 - now 24mmHg by echo 06/2017. CTA  chest in 11/16 with no PE. PFTs in 7/15 with mild obstructive lung disease. She had a negative sleep study in 2017. b. Felt due to left sided HF.  . Takotsubo syndrome 07/29/2015   a. EF 35-40% by echo; akinesis of mid-apical anteroseptal and apical myocardium.  EF now normalized on echo 11/2015   Past Surgical History:  Procedure Laterality Date  . APPENDECTOMY    . BREAST SURGERY     lumpectomy left  . CARDIAC CATHETERIZATION    . CARDIAC CATHETERIZATION N/A 07/28/2015   Procedure: Left Heart Cath and Coronary Angiography;  Surgeon: Peter M Martinique, MD;  Location: [facility];  Service: Cardiovascular;  Laterality: N/A;  . CHOLECYSTECTOMY    . COLECTOMY     right side  . EXCISION OF ACCESSORY NIPPLE Bilateral 05/30/2013   Procedure: BILATERAL NIPPLE BIOPSY;  Surgeon: Merrie Roof, MD;  Location: [facility];  Service: General;  Laterality: Bilateral;  . EYE SURGERY Bilateral 12   cataracts  . IR RADIOLOGIST EVAL & MGMT  07/26/2017  . MASTECTOMY PARTIAL / LUMPECTOMY  2008   left breast  . SPLIT NIGHT STUDY  02/02/2016       Current Outpatient Medications on File Prior to Visit  Medication Sig Dispense Refill  . acetaminophen (TYLENOL) 500 MG tablet Take 500-1,000 mg by mouth every 6 (six) hours as needed for headache (pain).    Marland Kitchen amLODipine (NORVASC) 5 MG tablet TAKE ONE TABLET BY MOUTH EVERY DAY (Patient taking differently: TAKE ONE TABLET (5 MG)BY MOUTH EVERY DAY) 30 tablet 3  . augmented betamethasone dipropionate (DIPROLENE-AF) 0.05 % ointment Apply 1 application topically 2 (two) times daily as needed (rash/dry skin).     . cetirizine (ZYRTEC) 10 MG tablet Take 10 mg by mouth daily as needed for allergies.    . ciprofloxacin (CIPRO) 500 MG tablet Take 1 tablet (500 mg total) by mouth 2 (two) times daily. 20 tablet 0  . clonazePAM (KLONOPIN) 0.5 MG tablet TAKE 1 TABLET BY MOUTH TWICE DAILY AS NEEDED FOR ANXIETY (Patient taking differently: TAKE 1 TABLET (0.5 MG) BY MOUTH TWICE DAILY AS NEEDED FOR ANXIETY) 60 tablet 2  . cloNIDine (CATAPRES) 0.1 MG tablet TAKE ONE TABLET BY MOUTH TWICE DAILY (Patient taking differently: TAKE ONE TABLET (0.1 MG) BY MOUTH TWICE DAILY) 60 tablet 11  . Guaifenesin (MUCINEX MAXIMUM STRENGTH) 1200 MG TB12 Take 1,200 mg by mouth daily as needed (congestion).    Marland Kitchen losartan (COZAAR) 100 MG tablet TAKE ONE TABLET BY MOUTH EVERY DAY (Patient taking differently: TAKE ONE TABLET (100 MG) BY MOUTH EVERY DAY) 30 tablet 3  . metoprolol succinate (TOPROL XL) 25 MG 24 hr tablet Take 1 tablet (25 mg total) by mouth daily. 30 tablet 1  . metroNIDAZOLE (FLAGYL) 500 MG tablet Take 1 tablet (500 mg total) by mouth 2 (two) times daily. 20 tablet 0  . mirtazapine (REMERON) 15 MG tablet TAKE 1 TABLET BY MOUTH AT BEDTIME 90 tablet 0  . nitroGLYCERIN (NITROSTAT) 0.4 MG SL tablet PLACE  ONE TABLET UNDER THE TONGUE EVERY FIVE MINUTES AS NEEDED FOR CHEST PAIN. [place] REPEAT FOR THREE DOSES. 30 tablet 2  . omeprazole (PRILOSEC OTC) 20 MG tablet Take 20 mg by mouth daily with supper.    Marland Kitchen OVER THE COUNTER MEDICATION Place 1 drop into both eyes daily as needed (dry eyes). Over the counter lubricating eye drops    . spironolactone (ALDACTONE) 25 MG tablet TAKE ONE-HALF TABLET BY MOUTH ONCE  DAILY (Patient taking differently: TAKE ONE-HALF TABLET (12.5 MG) BY MOUTH ONCE DAILY) 45 tablet 3  . torsemide (DEMADEX) 20 MG tablet Take 60 mg by mouth daily.     Marland Kitchen warfarin (COUMADIN) 1 MG tablet Take 0.5 tablets (0.5 mg total) by mouth as directed. M-W-F-SA (Patient taking differently: Take 3 mg by mouth. Take 3 mg by mouth at bedtime Monday, Wednesday, Friday, Saturday (see additional notes for 5 mg tablet)) 90 tablet 2  . warfarin (COUMADIN) 5 MG tablet TAKE ONE-HALF TABLET (2.5MG ) BY MOUTH DAILY. (ON MONDAY, WEDNESDAY, FRIDAY ANDSATURDAY, ADD ONE-HALF TABLET OF WARFARIN 1MG ). (Patient taking differently: 2.5 mg Sun,T,TH) 90 tablet 2   No current facility-administered medications on file prior to visit.    Allergies  Allergen Reactions  . Contrast Media [Iodinated Diagnostic Agents] Hives and Other (See Comments)    Per pt strong burning sensation starting in chest radiating outward   . Clonidine Derivatives Other (See Comments)    Throat dry  . Statins Other (See Comments)    "bones hurt"  . Sulfa Antibiotics Diarrhea    Tremors   . Celebrex [Celecoxib] Rash  . Isosorbide Nitrate Itching and Rash  . Other Itching and Rash    Plastic and paper tape and heart monitor pads  . Tape Itching and Rash    Red Where applied and will spread   Social History   Socioeconomic History  . Marital status: Married    Spouse name: Not on file  . Number of children: Not on file  . Years of education: Not on file  . Highest education level: Not on file  Social Needs  . Financial resource strain:  Not on file  . Food insecurity - worry: Not on file  . Food insecurity - inability: Not on file  . Transportation needs - medical: Not on file  . Transportation needs - non-medical: Not on file  Occupational History  . Not on file  Tobacco Use  . Smoking status: Never Smoker  . Smokeless tobacco: Never Used  Substance and Sexual Activity  . Alcohol use: No  . Drug use: No  . Sexual activity: Not Currently  Other Topics Concern  . Not on file  Social History Narrative  . Not on file      Review of Systems  All other systems reviewed and are negative.      Objective:   Physical Exam  Cardiovascular: Normal rate and normal heart sounds.  Pulmonary/Chest: Effort normal and breath sounds normal.  Abdominal: Normal appearance and bowel sounds are normal. She exhibits no distension. There is tenderness in the right lower quadrant and left lower quadrant. There is no rigidity, no rebound and no guarding.  Skin: Skin is warm.  Vitals reviewed.         Assessment & Plan:  Permanent atrial fibrillation ([place]) - Plan: PT with INR/Fingerstick, PT with INR/Fingerstick  LLQ abdominal pain  Diverticulitis  Patient clinically is improving.  Finish antibiotics and then follow-up as planned with her general Psychologist, sport and exercise.  Decrease Coumadin to 2.5 mg every day except Monday and Thursdays.  On Monday and Thursdays take 3 mg.  Recheck INR in 2 weeks.  I did refill pain medication that she could take as needed for pain.  She is currently taking hydrocodone/acetaminophen 7.5/325 once or twice a day when the pain is severe.  I recommended that she decrease it to once a day and I will give her 30 tablets.  Take MiraLAX to stay  regular

## 2017-11-21 ENCOUNTER — Other Ambulatory Visit (HOSPITAL_COMMUNITY): Payer: Self-pay | Admitting: Cardiology

## 2017-11-23 ENCOUNTER — Other Ambulatory Visit: Payer: Self-pay | Admitting: Gastroenterology

## 2017-11-24 ENCOUNTER — Ambulatory Visit: Payer: Medicare Other | Admitting: Family Medicine

## 2017-11-24 ENCOUNTER — Encounter: Payer: Self-pay | Admitting: Family Medicine

## 2017-11-24 VITALS — BP 120/40 | HR 72 | Temp 97.9°F | Resp 16 | Ht 67.0 in | Wt 202.0 lb

## 2017-11-24 DIAGNOSIS — S39012A Strain of muscle, fascia and tendon of lower back, initial encounter: Secondary | ICD-10-CM | POA: Diagnosis not present

## 2017-11-24 MED ORDER — TIZANIDINE HCL 4 MG PO TABS: 4.0000 mg | ORAL_TABLET | Freq: Three times a day (TID) | ORAL | 0 refills | Status: DC | PRN

## 2017-11-24 NOTE — Progress Notes (Signed)
Subjective:    Patient ID: [name], female    DOB: May 20, 1935, 83 y.o.   MRN: [id number]  HPI  11/15/17 Was seen in the emergency room on March 2.  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  Patient previously had a percutaneous drain in the left lower quadrant for diverticulitis in November.  This had been removed.  There is possible fistula formation confirmed on recent CT in the emergency room however at that time there was no evidence of diverticulitis.  Patient has been scheduled for elective surgery under the care of Dr. Marlou Starks for removal of the inflamed portion of the sigmoid colon per her report.  Dr. Denman George with gynecologic is also planning to assist to evaluate fullness in the left adnexa.  This surgery is being planned and has not yet been scheduled.  However over the last week the pain has intensified in the left lower quadrant.  She is now extremely tender to palpation in that area.  Pain is radiating in a bandlike pattern along the lower abdomen and is also radiating into her back.  She denies any fevers but does report diarrhea and nausea.  At that time, my plan was: I am concerned that the patient [place] be developing diverticulitis.  We will cover the patient with Cipro 500 mg p.o. twice daily and Flagyl 500 mg p.o. twice daily and recheck the patient on Friday.  Antibiotics will undoubtedly affect INR and I will recheck the patient's INR Friday as well and adjust her Coumadin is necessary.  11/18/17 Here today for follow-up.  She states her belly feels considerably better than 3 days ago on Cipro and Flagyl.  Her pain has improved and she is no longer tender to palpation in the lower abdomen.  She continues to have pain but it is much better and seems to be improving on a daily basis.  She denies any fevers.  She denies any nausea or vomiting.  She has had some diarrhea recently but denies any blood in her stool.  Her INR today is  3.3.  At that time, my plan was: Patient clinically is improving.  Finish antibiotics and then follow-up as planned with her general Psychologist, sport and exercise.  Decrease Coumadin to 2.5 mg every day except Monday and Thursdays.  On Monday and Thursdays take 3 mg.  Recheck INR in 2 weeks.  I did refill pain medication that she could take as needed for pain.  She is currently taking hydrocodone/acetaminophen 7.5/325 once or twice a day when the pain is severe.  I recommended that she decrease it to once a day and I will give her 30 tablets.  Take MiraLAX to stay regular  11/24/17 Patient's abdominal pain has improved dramatically.  However recently, her husband fell at a grocery store and she was trying to help him off the floor when she felt something pull in her lower back.  The pain is primarily on her right flank.  It is made worse by bending over.  It is made worse by twisting motions.  She denies any numbness or tingling in her legs.  She denies any weakness in her legs.  She has 5/5 muscle strength equal and symmetric in both legs.  She has normal reflexes checked at the knee as well as the ankle bilaterally.  She is tender to palpation in the lower right flank. Past Medical History:  Diagnosis Date  . Allergy  rhinitis  . Aortic stenosis    mild by echo 06/2017  . Arthritis   . Bradycardia    a. 10/2017 -> beta blocker cut back due to HR 39.  . Breast cancer ([place]) 01/06/2012  . Cancer [facility])    right colon and left breast  . Chronic diastolic CHF (congestive heart failure) ([place])   . Colon cancer ([place]) 01/06/2012  . COPD (chronic obstructive pulmonary disease) ([place])   . Coronary artery disease 2006   a.  NSTEMI in 2016, cath showed 15% prox-mid RCA, 20% prox LAD, EF 25-35% by cath and 35-40% -> felt due to Takotsubo cardiomyopathy.  . Dilated aortic root ([place])    83mmHg by echo 06/2017  . Diverticulosis   . Edema extremities   . GERD (gastroesophageal reflux disease)   . Hernia   . Hiatal hernia    denies   . Hyperlipidemia   . Hypertension   . Mild aortic stenosis    echo 11/2015 but not noted on echo 06/2016  . Osteopenia   . Permanent atrial fibrillation (HCC)    chronic atrial fibrillation  . Pneumonia    hx child  . Pulmonary HTN ([place])    a. moderate to severe PASP 69mmHg echo 11/2015 - now 56mmHg by echo 06/2017. CTA chest in 11/16 with no PE. PFTs in 7/15 with mild obstructive lung disease. She had a negative sleep study in 2017. b. Felt due to left sided HF.  . Takotsubo syndrome 07/29/2015   a. EF 35-40% by echo; akinesis of mid-apical anteroseptal and apical myocardium.  EF now normalized on echo 11/2015   Past Surgical History:  Procedure Laterality Date  . APPENDECTOMY    . BREAST SURGERY     lumpectomy left  . CARDIAC CATHETERIZATION    . CARDIAC CATHETERIZATION N/A 07/28/2015   Procedure: Left Heart Cath and Coronary Angiography;  Surgeon: Peter M Martinique, MD;  Location: [facility];  Service: Cardiovascular;  Laterality: N/A;  . CHOLECYSTECTOMY    . COLECTOMY     right side  . EXCISION OF ACCESSORY NIPPLE Bilateral 05/30/2013   Procedure: BILATERAL NIPPLE BIOPSY;  Surgeon: Merrie Roof, MD;  Location: [facility];  Service: General;  Laterality: Bilateral;  . EYE SURGERY Bilateral 12   cataracts  . IR RADIOLOGIST EVAL & MGMT  07/26/2017  . MASTECTOMY PARTIAL / LUMPECTOMY  2008   left breast  . SPLIT NIGHT STUDY  02/02/2016       Current Outpatient Medications on File Prior to Visit  Medication Sig Dispense Refill  . acetaminophen (TYLENOL) 500 MG tablet Take 500-1,000 mg by mouth every 6 (six) hours as needed for headache (pain).    Marland Kitchen amLODipine (NORVASC) 5 MG tablet TAKE ONE TABLET BY MOUTH EVERY DAY 30 tablet 3  . augmented betamethasone dipropionate (DIPROLENE-AF) 0.05 % ointment Apply 1 application topically 2 (two) times daily as needed (rash/dry skin).     . cetirizine (ZYRTEC) 10 MG tablet Take 10 mg by mouth daily as needed for allergies.    .  ciprofloxacin (CIPRO) 500 MG tablet Take 1 tablet (500 mg total) by mouth 2 (two) times daily. 20 tablet 0  . clonazePAM (KLONOPIN) 0.5 MG tablet TAKE 1 TABLET BY MOUTH TWICE DAILY AS NEEDED FOR ANXIETY (Patient taking differently: TAKE 1 TABLET (0.5 MG) BY MOUTH TWICE DAILY AS NEEDED FOR ANXIETY) 60 tablet 2  . cloNIDine (CATAPRES) 0.1 MG tablet TAKE ONE TABLET BY MOUTH TWICE DAILY (Patient taking  differently: TAKE ONE TABLET (0.1 MG) BY MOUTH TWICE DAILY) 60 tablet 11  . Guaifenesin (MUCINEX MAXIMUM STRENGTH) 1200 MG TB12 Take 1,200 mg by mouth daily as needed (congestion).    Marland Kitchen HYDROcodone-acetaminophen (NORCO) 7.5-325 MG tablet Take 1 tablet by mouth every 6 (six) hours as needed for severe pain. 30 tablet 0  . losartan (COZAAR) 100 MG tablet TAKE ONE TABLET BY MOUTH EVERY DAY 30 tablet 3  . metoprolol succinate (TOPROL XL) 25 MG 24 hr tablet Take 1 tablet (25 mg total) by mouth daily. 30 tablet 1  . metroNIDAZOLE (FLAGYL) 500 MG tablet Take 1 tablet (500 mg total) by mouth 2 (two) times daily. 20 tablet 0  . mirtazapine (REMERON) 15 MG tablet TAKE 1 TABLET BY MOUTH AT BEDTIME 90 tablet 0  . nitroGLYCERIN (NITROSTAT) 0.4 MG SL tablet PLACE ONE TABLET UNDER THE TONGUE EVERY FIVE MINUTES AS NEEDED FOR CHEST PAIN. [place] REPEAT FOR THREE DOSES. 30 tablet 2  . omeprazole (PRILOSEC OTC) 20 MG tablet Take 20 mg by mouth daily with supper.    Marland Kitchen OVER THE COUNTER MEDICATION Place 1 drop into both eyes daily as needed (dry eyes). Over the counter lubricating eye drops    . spironolactone (ALDACTONE) 25 MG tablet TAKE ONE-HALF TABLET BY MOUTH ONCE DAILY (Patient taking differently: TAKE ONE-HALF TABLET (12.5 MG) BY MOUTH ONCE DAILY) 45 tablet 3  . torsemide (DEMADEX) 20 MG tablet Take 60 mg by mouth daily.     Marland Kitchen warfarin (COUMADIN) 1 MG tablet Take 0.5 tablets (0.5 mg total) by mouth as directed. M-W-F-SA (Patient taking differently: Take 3 mg by mouth. Take 3 mg by mouth at bedtime Monday, Wednesday, Friday,  Saturday (see additional notes for 5 mg tablet)) 90 tablet 2  . warfarin (COUMADIN) 5 MG tablet TAKE ONE-HALF TABLET (2.5MG ) BY MOUTH DAILY. (ON MONDAY, WEDNESDAY, FRIDAY ANDSATURDAY, ADD ONE-HALF TABLET OF WARFARIN 1MG ). (Patient taking differently: 2.5 mg Sun,T,TH) 90 tablet 2   No current facility-administered medications on file prior to visit.    Allergies  Allergen Reactions  . Contrast Media [Iodinated Diagnostic Agents] Hives and Other (See Comments)    Per pt strong burning sensation starting in chest radiating outward   . Clonidine Derivatives Other (See Comments)    Throat dry  . Statins Other (See Comments)    "bones hurt"  . Sulfa Antibiotics Diarrhea    Tremors   . Celebrex [Celecoxib] Rash  . Isosorbide Nitrate Itching and Rash  . Other Itching and Rash    Plastic and paper tape and heart monitor pads  . Tape Itching and Rash    Red Where applied and will spread   Social History   Socioeconomic History  . Marital status: Married    Spouse name: Not on file  . Number of children: Not on file  . Years of education: Not on file  . Highest education level: Not on file  Occupational History  . Not on file  Social Needs  . Financial resource strain: Not on file  . Food insecurity:    Worry: Not on file    Inability: Not on file  . Transportation needs:    Medical: Not on file    Non-medical: Not on file  Tobacco Use  . Smoking status: Never Smoker  . Smokeless tobacco: Never Used  Substance and Sexual Activity  . Alcohol use: No  . Drug use: No  . Sexual activity: Not Currently  Lifestyle  . Physical activity:  Days per week: Not on file    Minutes per session: Not on file  . Stress: Not on file  Relationships  . Social connections:    Talks on phone: Not on file    Gets together: Not on file    Attends religious service: Not on file    Active member of club or organization: Not on file    Attends meetings of clubs or organizations: Not on file     Relationship status: Not on file  . Intimate partner violence:    Fear of current or ex partner: Not on file    Emotionally abused: Not on file    Physically abused: Not on file    Forced sexual activity: Not on file  Other Topics Concern  . Not on file  Social History Narrative  . Not on file      Review of Systems  Musculoskeletal: Positive for back pain.  All other systems reviewed and are negative.      Objective:   Physical Exam  Cardiovascular: Normal rate and normal heart sounds.  Pulmonary/Chest: Effort normal and breath sounds normal.  Abdominal: Normal appearance and bowel sounds are normal. She exhibits no distension. There is no tenderness. There is no rigidity, no rebound and no guarding.  Musculoskeletal:       Lumbar back: She exhibits decreased range of motion, tenderness, pain and spasm.       Back:  Skin: Skin is warm.  Vitals reviewed.         Assessment & Plan:  Strain of lumbar region, initial encounter  I believe the patient has strained a muscle in her lower back.  She cannot use NSAIDs due to Coumadin.  She has been using hydrocodone for the pain.  I have recommended tincture of time.  I recommended rest.  I recommended warm compresses to be used for comfort.  Patient can use Zanaflex 4 mg every 8 hours as needed for muscle spasms however I cautioned the patient not to use this medication with Klonopin and to not use the medication with narcotic pain medication due to the risk of sedation.

## 2017-12-05 ENCOUNTER — Encounter: Payer: Self-pay | Admitting: Family Medicine

## 2017-12-05 ENCOUNTER — Ambulatory Visit: Payer: Medicare Other | Admitting: Family Medicine

## 2017-12-05 DIAGNOSIS — I482 Chronic atrial fibrillation: Secondary | ICD-10-CM

## 2017-12-05 DIAGNOSIS — I4821 Permanent atrial fibrillation: Secondary | ICD-10-CM

## 2017-12-05 LAB — PT WITH INR/FINGERSTICK
INR FINGERSTICK: 2.2 ratio — AB
PT FINGERSTICK: 26.7 s — AB (ref 10.5–13.1)

## 2017-12-05 MED ORDER — HYDROCODONE-ACETAMINOPHEN 7.5-325 MG PO TABS: 1.0000 | ORAL_TABLET | Freq: Four times a day (QID) | ORAL | 0 refills | Status: DC | PRN

## 2017-12-05 NOTE — Progress Notes (Signed)
Subjective:    Patient ID: [name], female    DOB: May 11, 1935, 83 y.o.   MRN: [id number]  HPI  11/15/17 Was seen in the emergency room on March 2.  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  Patient previously had a percutaneous drain in the left lower quadrant for diverticulitis in November.  This had been removed.  There is possible fistula formation confirmed on recent CT in the emergency room however at that time there was no evidence of diverticulitis.  Patient has been scheduled for elective surgery under the care of Dr. Marlou Starks for removal of the inflamed portion of the sigmoid colon per her report.  Dr. Denman George with gynecologic is also planning to assist to evaluate fullness in the left adnexa.  This surgery is being planned and has not yet been scheduled.  However over the last week the pain has intensified in the left lower quadrant.  She is now extremely tender to palpation in that area.  Pain is radiating in a bandlike pattern along the lower abdomen and is also radiating into her back.  She denies any fevers but does report diarrhea and nausea.  At that time, my plan was: I am concerned that the patient [place] be developing diverticulitis.  We will cover the patient with Cipro 500 mg p.o. twice daily and Flagyl 500 mg p.o. twice daily and recheck the patient on Friday.  Antibiotics will undoubtedly affect INR and I will recheck the patient's INR Friday as well and adjust her Coumadin is necessary.  11/18/17 Here today for follow-up.  She states her belly feels considerably better than 3 days ago on Cipro and Flagyl.  Her pain has improved and she is no longer tender to palpation in the lower abdomen.  She continues to have pain but it is much better and seems to be improving on a daily basis.  She denies any fevers.  She denies any nausea or vomiting.  She has had some diarrhea recently but denies any blood in her stool.  Her INR today is  3.3.  At that time, my plan was: Patient clinically is improving.  Finish antibiotics and then follow-up as planned with her general Psychologist, sport and exercise.  Decrease Coumadin to 2.5 mg every day except Monday and Thursdays.  On Monday and Thursdays take 3 mg.  Recheck INR in 2 weeks.  I did refill pain medication that she could take as needed for pain.  She is currently taking hydrocodone/acetaminophen 7.5/325 once or twice a day when the pain is severe.  I recommended that she decrease it to once a day and I will give her 30 tablets.  Take MiraLAX to stay regular  12/05/17 Patient is here today to recheck her INR.  She denies any bleeding or bruising.  She is taking 3 mg on Mondays and Thursdays of Coumadin and on all other days she is taking 2.5 mg.  She denies any complications from the medicine.  She does report continued below back pain.  She is unable to take NSAIDs because of her Coumadin use.  She is having to use Norco 1 pill a day in the mornings due to the severity of her back pain.  After she takes this, she is able to get up and have a decent quality of life during the day. Past Medical History:  Diagnosis Date  . Allergy    rhinitis  . Aortic stenosis  mild by echo 06/2017  . Arthritis   . Bradycardia    a. 10/2017 -> beta blocker cut back due to HR 39.  . Breast cancer ([place]) 01/06/2012  . Cancer [facility])    right colon and left breast  . Chronic diastolic CHF (congestive heart failure) ([place])   . Colon cancer ([place]) 01/06/2012  . COPD (chronic obstructive pulmonary disease) ([place])   . Coronary artery disease 2006   a.  NSTEMI in 2016, cath showed 15% prox-mid RCA, 20% prox LAD, EF 25-35% by cath and 35-40% -> felt due to Takotsubo cardiomyopathy.  . Dilated aortic root ([place])    73mmHg by echo 06/2017  . Diverticulosis   . Edema extremities   . GERD (gastroesophageal reflux disease)   . Hernia   . Hiatal hernia    denies  . Hyperlipidemia   . Hypertension   . Mild aortic stenosis    echo 11/2015  but not noted on echo 06/2016  . Osteopenia   . Permanent atrial fibrillation ([facility])    chronic atrial fibrillation  . Pneumonia    hx child  . Pulmonary HTN ([place])    a. moderate to severe PASP 15mmHg echo 11/2015 - now 72mmHg by echo 06/2017. CTA chest in 11/16 with no PE. PFTs in 7/15 with mild obstructive lung disease. She had a negative sleep study in 2017. b. Felt due to left sided HF.  . Takotsubo syndrome 07/29/2015   a. EF 35-40% by echo; akinesis of mid-apical anteroseptal and apical myocardium.  EF now normalized on echo 11/2015   Past Surgical History:  Procedure Laterality Date  . APPENDECTOMY    . BREAST SURGERY     lumpectomy left  . CARDIAC CATHETERIZATION    . CARDIAC CATHETERIZATION N/A 07/28/2015   Procedure: Left Heart Cath and Coronary Angiography;  Surgeon: Peter M Martinique, MD;  Location: [facility];  Service: Cardiovascular;  Laterality: N/A;  . CHOLECYSTECTOMY    . COLECTOMY     right side  . EXCISION OF ACCESSORY NIPPLE Bilateral 05/30/2013   Procedure: BILATERAL NIPPLE BIOPSY;  Surgeon: Merrie Roof, MD;  Location: [place];  Service: General;  Laterality: Bilateral;  . EYE SURGERY Bilateral 12   cataracts  . IR RADIOLOGIST EVAL & MGMT  07/26/2017  . MASTECTOMY PARTIAL / LUMPECTOMY  2008   left breast  . SPLIT NIGHT STUDY  02/02/2016       Current Outpatient Medications on File Prior to Visit  Medication Sig Dispense Refill  . acetaminophen (TYLENOL) 500 MG tablet Take 500-1,000 mg by mouth every 6 (six) hours as needed for headache (pain).    Marland Kitchen amLODipine (NORVASC) 5 MG tablet TAKE ONE TABLET BY MOUTH EVERY DAY 30 tablet 3  . augmented betamethasone dipropionate (DIPROLENE-AF) 0.05 % ointment Apply 1 application topically 2 (two) times daily as needed (rash/dry skin).     . cetirizine (ZYRTEC) 10 MG tablet Take 10 mg by mouth daily as needed for allergies.    . clonazePAM (KLONOPIN) 0.5 MG tablet TAKE 1 TABLET BY MOUTH TWICE DAILY AS NEEDED FOR  ANXIETY (Patient taking differently: TAKE 1 TABLET (0.5 MG) BY MOUTH TWICE DAILY AS NEEDED FOR ANXIETY) 60 tablet 2  . cloNIDine (CATAPRES) 0.1 MG tablet TAKE ONE TABLET BY MOUTH TWICE DAILY (Patient taking differently: TAKE ONE TABLET (0.1 MG) BY MOUTH TWICE DAILY) 60 tablet 11  . Guaifenesin (MUCINEX MAXIMUM STRENGTH) 1200 MG TB12 Take 1,200 mg by mouth daily as needed (  congestion).    Marland Kitchen losartan (COZAAR) 100 MG tablet TAKE ONE TABLET BY MOUTH EVERY DAY 30 tablet 3  . metoprolol succinate (TOPROL XL) 25 MG 24 hr tablet Take 1 tablet (25 mg total) by mouth daily. 30 tablet 1  . mirtazapine (REMERON) 15 MG tablet TAKE 1 TABLET BY MOUTH AT BEDTIME 90 tablet 0  . nitroGLYCERIN (NITROSTAT) 0.4 MG SL tablet PLACE ONE TABLET UNDER THE TONGUE EVERY FIVE MINUTES AS NEEDED FOR CHEST PAIN. [place] REPEAT FOR THREE DOSES. 30 tablet 2  . omeprazole (PRILOSEC OTC) 20 MG tablet Take 20 mg by mouth daily with supper.    Marland Kitchen OVER THE COUNTER MEDICATION Place 1 drop into both eyes daily as needed (dry eyes). Over the counter lubricating eye drops    . spironolactone (ALDACTONE) 25 MG tablet TAKE ONE-HALF TABLET BY MOUTH ONCE DAILY (Patient taking differently: TAKE ONE-HALF TABLET (12.5 MG) BY MOUTH ONCE DAILY) 45 tablet 3  . tiZANidine (ZANAFLEX) 4 MG tablet Take 1 tablet (4 mg total) by mouth every 8 (eight) hours as needed for muscle spasms. 30 tablet 0  . torsemide (DEMADEX) 20 MG tablet Take 60 mg by mouth daily.     Marland Kitchen warfarin (COUMADIN) 1 MG tablet Take 0.5 tablets (0.5 mg total) by mouth as directed. M-W-F-SA (Patient taking differently: Take 3 mg by mouth. Take 3 mg by mouth at bedtime Monday, Wednesday, Friday, Saturday (see additional notes for 5 mg tablet)) 90 tablet 2  . warfarin (COUMADIN) 5 MG tablet TAKE ONE-HALF TABLET (2.5MG ) BY MOUTH DAILY. (ON MONDAY, WEDNESDAY, FRIDAY ANDSATURDAY, ADD ONE-HALF TABLET OF WARFARIN 1MG ). (Patient taking differently: 2.5 mg Sun,T,TH) 90 tablet 2   No current  facility-administered medications on file prior to visit.    Allergies  Allergen Reactions  . Contrast Media [Iodinated Diagnostic Agents] Hives and Other (See Comments)    Per pt strong burning sensation starting in chest radiating outward   . Clonidine Derivatives Other (See Comments)    Throat dry  . Statins Other (See Comments)    "bones hurt"  . Sulfa Antibiotics Diarrhea    Tremors   . Celebrex [Celecoxib] Rash  . Isosorbide Nitrate Itching and Rash  . Other Itching and Rash    Plastic and paper tape and heart monitor pads  . Tape Itching and Rash    Red Where applied and will spread   Social History   Socioeconomic History  . Marital status: Married    Spouse name: Not on file  . Number of children: Not on file  . Years of education: Not on file  . Highest education level: Not on file  Occupational History  . Not on file  Social Needs  . Financial resource strain: Not on file  . Food insecurity:    Worry: Not on file    Inability: Not on file  . Transportation needs:    Medical: Not on file    Non-medical: Not on file  Tobacco Use  . Smoking status: Never Smoker  . Smokeless tobacco: Never Used  Substance and Sexual Activity  . Alcohol use: No  . Drug use: No  . Sexual activity: Not Currently  Lifestyle  . Physical activity:    Days per week: Not on file    Minutes per session: Not on file  . Stress: Not on file  Relationships  . Social connections:    Talks on phone: Not on file    Gets together: Not on file  Attends religious service: Not on file    Active member of club or organization: Not on file    Attends meetings of clubs or organizations: Not on file    Relationship status: Not on file  . Intimate partner violence:    Fear of current or ex partner: Not on file    Emotionally abused: Not on file    Physically abused: Not on file    Forced sexual activity: Not on file  Other Topics Concern  . Not on file  Social History Narrative  .  Not on file      Review of Systems  Musculoskeletal: Positive for back pain.  All other systems reviewed and are negative.      Objective:   Physical Exam  Cardiovascular: Normal rate and normal heart sounds.  Pulmonary/Chest: Effort normal and breath sounds normal.  Abdominal: Normal appearance and bowel sounds are normal. She exhibits no distension. There is no tenderness. There is no rigidity, no rebound and no guarding.  Skin: Skin is warm.  Vitals reviewed.         Assessment & Plan:  Permanent atrial fibrillation ([place]) - Plan: PT with INR/Fingerstick  INR is therapeutic today.  Recommended continuing on her present dose of Coumadin and recheck INR in 4 weeks.  Patient is listed as bradycardic today on her vital signs.  However during her exam, her heart rate is typically in the upper 50s-60s.  I see no reason to discontinue metoprolol based on her clinical exam.  I did refill her Norco 7.5/325 1 p.o. every 6 hours as needed severe pain.  However I explained to the patient that we will her to try to limit this medication use sparingly due to the risk of falls, confusion, and constipation.  I would like 30 tablets to last 30 days.

## 2017-12-09 ENCOUNTER — Other Ambulatory Visit: Payer: Self-pay

## 2017-12-09 ENCOUNTER — Ambulatory Visit
Admission: RE | Admit: 2017-12-09 | Discharge: 2017-12-09 | Disposition: A | Discharge status: Home / Self Care | Payer: Medicare Other | Source: Ambulatory Visit | Attending: Family Medicine | Admitting: Family Medicine

## 2017-12-09 ENCOUNTER — Encounter: Payer: Self-pay | Admitting: Family Medicine

## 2017-12-09 ENCOUNTER — Ambulatory Visit: Payer: Medicare Other | Admitting: Family Medicine

## 2017-12-09 VITALS — BP 144/72 | HR 52 | Temp 98.4°F | Ht 67.0 in | Wt 206.0 lb

## 2017-12-09 DIAGNOSIS — M549 Dorsalgia, unspecified: Secondary | ICD-10-CM | POA: Diagnosis not present

## 2017-12-09 DIAGNOSIS — R109 Unspecified abdominal pain: Secondary | ICD-10-CM

## 2017-12-09 DIAGNOSIS — M47815 Spondylosis without myelopathy or radiculopathy, thoracolumbar region: Secondary | ICD-10-CM | POA: Diagnosis not present

## 2017-12-09 LAB — URINALYSIS, ROUTINE W REFLEX MICROSCOPIC
Bacteria, UA: NONE SEEN /HPF
Bilirubin Urine: NEGATIVE
Glucose, UA: NEGATIVE
HYALINE CAST: NONE SEEN /LPF
Hgb urine dipstick: NEGATIVE
Ketones, ur: NEGATIVE
Leukocytes, UA: NEGATIVE
Nitrite: NEGATIVE
Protein, ur: NEGATIVE
RBC / HPF: NONE SEEN /HPF (ref 0–2)
SPECIFIC GRAVITY, URINE: 1.007 (ref 1.001–1.03)
pH: 6 (ref 5.0–8.0)

## 2017-12-09 NOTE — Progress Notes (Signed)
Advanced degenerative changes of thoracic to lumbar spine.  No acute findings (no new changes, no new fractures).  The X-ray is fairly normal for her age and considering her diagnosis of osteopenia

## 2017-12-09 NOTE — Progress Notes (Addendum)
Patient ID: [name], female    DOB: September 28, 1934, 83 y.o.   MRN: [id number]  PCP: Orlena Sheldon, PA-C  Chief Complaint  Patient presents with  . Back Pain    Patient has concerns of lumbar back pain that goes up her side into her ribs.     Subjective:   [name] is a 83 y.o. female, with complicated medical hx including uterine and L adnexal mass, pelvic abscess, diverticulitis, osteoporosis, CC s/p right hemicolectomy (2003) and breast CA, she presents with back pain that has been ongoing for roughly 6 weeks.  Recent visits have been reviewed and HPI copied below from eval with Dr. Dennard Schaumann.  She has had normal neuro exams, been dx with lumbar strain, onset was initially after bending over and helping pull up her husband who had fallen down, she had immediately felt a pull in her low back and was subsequently seen and treated with pain meds, muscle relaxers and heat therapy.  Pt states that she has doubled the pain medicine dose because it was no longer helping.  Yesterday the pain worsened, located in her right low back, radiating around her right buttock to hip and right abdomen and also shooting up under her ribs which concerned her.  Heat makes it worse.  The muscle relaxers just make her fall asleep.  And she feels in general it is unchanged or slightly worsening.  She denies any numbness, weakness, tingling, dysuria, hematuria, incontinence, fever, chills, sweats, bowel changes. The pain that she describes going to her right hip and inside her right abdomen she states has been there for roughly 1 month and she has gone to the ER for this same pain, also has seen her surgeon for the same pain and she was told nothing was wrong with her.  No change from the onset of that pain 1 month ago.   She is scheduled for surgery in roughly 1 month.  She notes no changes to her left-sided abdominal pain.  No change to her bowels in the past month.  She states that at her baseline she is constipated and  is taking medication to help her have a bowel movement.   Patient Active Problem List   Diagnosis Date Noted  . Breast cancer ([place]) 09/13/2017  . Obesity, unspecified 09/13/2017  . Warfarin anticoagulation 09/13/2017  . Uterine mass 09/07/2017  . Adnexal mass 08/03/2017  . Pelvic abscess in female 07/07/2017  . Aortic stenosis   . Dyslipidemia, goal LDL below 70 11/28/2016  . Monitoring for long-term anticoagulant use 12/31/2015  . Chronic diastolic CHF (congestive heart failure) ([place]) 10/28/2015  . Insomnia 10/23/2015  . Leg swelling 08/11/2015  . Takotsubo syndrome 07/29/2015  . NSTEMI (non-ST elevated myocardial infarction) ([place]) 07/28/2015  . SOB (shortness of breath) 08/15/2014  . Dilated aortic root ([place])   . Pulmonary HTN ([place])   . Permanent atrial fibrillation ([place]) 07/04/2013  . Chronic anticoagulation 07/04/2013  . Coronary artery disease   . Lesion of nipple 05/15/2013  . GERD (gastroesophageal reflux disease)   . Hypertension   . COPD (chronic obstructive pulmonary disease) ([place])   . Osteopenia   . Diverticulosis   . Hiatal hernia   . Abdominal pain 01/11/2012  . Breast cancer of upper-outer quadrant of left female breast ([place]) 01/06/2012  . Colon cancer ([place]) 01/06/2012     Prior to Admission medications   Medication Sig Start Date End Date Taking? Authorizing Provider  acetaminophen (TYLENOL) 500  MG tablet Take 500-1,000 mg by mouth every 6 (six) hours as needed for headache (pain).   Yes [provider]  amLODipine (NORVASC) 5 MG tablet TAKE ONE TABLET BY MOUTH EVERY DAY 11/23/17  Yes Larey Dresser, MD  augmented betamethasone dipropionate (DIPROLENE-AF) 0.05 % ointment Apply 1 application topically 2 (two) times daily as needed (rash/dry skin).    Yes [provider]  cetirizine (ZYRTEC) 10 MG tablet Take 10 mg by mouth daily as needed for allergies.   Yes [provider]  clonazePAM (KLONOPIN) 0.5 MG tablet TAKE 1 TABLET BY  MOUTH TWICE DAILY AS NEEDED FOR ANXIETY Patient taking differently: TAKE 1 TABLET (0.5 MG) BY MOUTH TWICE DAILY AS NEEDED FOR ANXIETY 09/26/17  Yes Dixon, Mary B, PA-C  cloNIDine (CATAPRES) 0.1 MG tablet TAKE ONE TABLET BY MOUTH TWICE DAILY Patient taking differently: TAKE ONE TABLET (0.1 MG) BY MOUTH TWICE DAILY 12/14/16  Yes Turner, Eber Hong, MD  Guaifenesin (MUCINEX MAXIMUM STRENGTH) 1200 MG TB12 Take 1,200 mg by mouth daily as needed (congestion).   Yes [provider]  HYDROcodone-acetaminophen (NORCO) 7.5-325 MG tablet Take 1 tablet by mouth every 6 (six) hours as needed for severe pain. 12/05/17  Yes [name], MD  losartan (COZAAR) 100 MG tablet TAKE ONE TABLET BY MOUTH EVERY DAY 11/23/17  Yes Larey Dresser, MD  metoprolol succinate (TOPROL XL) 25 MG 24 hr tablet Take 1 tablet (25 mg total) by mouth daily. 10/25/17  Yes Duke, Tami Lin, PA  mirtazapine (REMERON) 15 MG tablet TAKE 1 TABLET BY MOUTH AT BEDTIME 11/08/17  Yes Dixon, Mary B, PA-C  nitroGLYCERIN (NITROSTAT) 0.4 MG SL tablet PLACE ONE TABLET UNDER THE TONGUE EVERY FIVE MINUTES AS NEEDED FOR CHEST PAIN. [place] REPEAT FOR THREE DOSES. 09/01/17  Yes Orlena Sheldon, PA-C  omeprazole (PRILOSEC OTC) 20 MG tablet Take 20 mg by mouth daily with supper.   Yes [provider]  OVER THE COUNTER MEDICATION Place 1 drop into both eyes daily as needed (dry eyes). Over the counter lubricating eye drops   Yes [provider]  spironolactone (ALDACTONE) 25 MG tablet TAKE ONE-HALF TABLET BY MOUTH ONCE DAILY Patient taking differently: TAKE ONE-HALF TABLET (12.5 MG) BY MOUTH ONCE DAILY 12/28/16  Yes Turner, Eber Hong, MD  tiZANidine (ZANAFLEX) 4 MG tablet Take 1 tablet (4 mg total) by mouth every 8 (eight) hours as needed for muscle spasms. 11/24/17  Yes [name], MD  torsemide (DEMADEX) 20 MG tablet Take 60 mg by mouth daily.    Yes [provider]  warfarin (COUMADIN) 1 MG tablet Take 0.5 tablets (0.5 mg  total) by mouth as directed. M-W-F-SA Patient taking differently: Take 3 mg by mouth. Take 3 mg by mouth at bedtime Monday, Wednesday, Friday, Saturday (see additional notes for 5 mg tablet) 07/08/16  Yes Dena Billet B, PA-C  warfarin (COUMADIN) 5 MG tablet TAKE ONE-HALF TABLET (2.5MG ) BY MOUTH DAILY. (ON MONDAY, WEDNESDAY, FRIDAY ANDSATURDAY, ADD ONE-HALF TABLET OF WARFARIN 1MG ). Patient taking differently: 2.5 mg Sun,T,TH 09/08/17  Yes Orlena Sheldon, PA-C     Allergies  Allergen Reactions  . Contrast Media [Iodinated Diagnostic Agents] Hives and Other (See Comments)    Per pt strong burning sensation starting in chest radiating outward   . Clonidine Derivatives Other (See Comments)    Throat dry  . Statins Other (See Comments)    "bones hurt"  . Sulfa Antibiotics Diarrhea    Tremors   .  Celebrex [Celecoxib] Rash  . Isosorbide Nitrate Itching and Rash  . Other Itching and Rash    Plastic and paper tape and heart monitor pads  . Tape Itching and Rash    Red Where applied and will spread     Family History  Problem Relation Age of Onset  . Heart disease Mother   . Heart attack Mother   . Cancer Sister        stomach and colon  . Heart disease Brother 75  . Hypertension Father   . Cancer Sister   . Stroke Neg Hx      Social History   Socioeconomic History  . Marital status: Married    Spouse name: Not on file  . Number of children: Not on file  . Years of education: Not on file  . Highest education level: Not on file  Occupational History  . Not on file  Social Needs  . Financial resource strain: Not on file  . Food insecurity:    Worry: Not on file    Inability: Not on file  . Transportation needs:    Medical: Not on file    Non-medical: Not on file  Tobacco Use  . Smoking status: Never Smoker  . Smokeless tobacco: Never Used  Substance and Sexual Activity  . Alcohol use: No  . Drug use: No  . Sexual activity: Not Currently  Lifestyle  . Physical  activity:    Days per week: Not on file    Minutes per session: Not on file  . Stress: Not on file  Relationships  . Social connections:    Talks on phone: Not on file    Gets together: Not on file    Attends religious service: Not on file    Active member of club or organization: Not on file    Attends meetings of clubs or organizations: Not on file    Relationship status: Not on file  . Intimate partner violence:    Fear of current or ex partner: Not on file    Emotionally abused: Not on file    Physically abused: Not on file    Forced sexual activity: Not on file  Other Topics Concern  . Not on file  Social History Narrative  . Not on file     Review of Systems  Constitutional: Negative.  Negative for chills, diaphoresis, fatigue, fever and unexpected weight change.  HENT: Negative.   Eyes: Negative.   Respiratory: Negative for cough, chest tightness and shortness of breath.   Cardiovascular: Negative for chest pain, palpitations and leg swelling.  Gastrointestinal: Positive for abdominal pain. Negative for abdominal distention, diarrhea, nausea and vomiting.  Endocrine: Negative.   Genitourinary: Negative.  Negative for decreased urine volume, difficulty urinating, dysuria, frequency, hematuria and urgency.  Musculoskeletal: Positive for back pain. Negative for gait problem and joint swelling.  Skin: Negative.  Negative for color change, pallor and rash.  Allergic/Immunologic: Negative.   Neurological: Negative.  Negative for weakness and numbness.  Hematological: Negative.   Psychiatric/Behavioral: Negative.   All other systems reviewed and are negative.      Objective:    Vitals:   12/09/17 0802  BP: (!) 144/72  Pulse: (!) 52  Temp: 98.4 F (36.9 C)  TempSrc: Oral  SpO2: 94%  Weight: 206 lb (93.4 kg)  Height: 5\' 7"  (1.702 m)     Physical Exam  Constitutional: She is oriented to person, place, and time. She appears well-developed and well-nourished.  No  distress.  Elderly, obese female, well-appearing, no acute distress  HENT:  Head: Normocephalic and atraumatic.  Nose: Nose normal.  Mouth/Throat: Oropharynx is clear and moist.  Eyes: Pupils are equal, round, and reactive to light. Conjunctivae are normal. No scleral icterus.  Neck: Normal range of motion. No tracheal deviation present.  Cardiovascular: Regular rhythm, normal heart sounds and intact distal pulses. Exam reveals no gallop and no friction rub.  No murmur heard. Regular rhythm, bradycardic  Pulmonary/Chest: Effort normal and breath sounds normal. No stridor. No respiratory distress. She has no wheezes. She has no rales. She exhibits no tenderness.  No tenderness to palpation of chest wall  Abdominal: Soft. Bowel sounds are normal. She exhibits no distension. There is tenderness.  Obese soft abdomen, nondistended.  Normal bowel sounds throughout all 4 quadrants.  No palpable abdominal masses, no CVA tenderness Tenderness to palpation of right lower quadrant and left lower quadrant without rebound or guarding.  Musculoskeletal:       Cervical back: Normal.       Thoracic back: She exhibits tenderness. She exhibits no bony tenderness, no swelling, no edema and no spasm.       Lumbar back: She exhibits decreased range of motion and tenderness. She exhibits no bony tenderness, no swelling and no spasm.       Back:  No midline spinal tenderness from cervical to lumbar spine Right thoracic and lumbar paraspinal muscle tenderness to palpation which extends to right SI joint Right hip normal without tenderness to palpation to bony prominences, normal internal and external rotation  Neurological: She is alert and oriented to person, place, and time. No sensory deficit. She exhibits normal muscle tone. Coordination normal.  Skin: Skin is warm and dry. Capillary refill takes less than 2 seconds. No bruising and no rash noted. She is not diaphoretic. No pallor.  Psychiatric: She has a  normal mood and affect. Her behavior is normal.  Nursing note and vitals reviewed.       Assessment & Plan:   1. Right-sided back pain, unspecified back location, unspecified chronicity Right lumbar strain previously evaluated documented, patient initially stated has become much worse and was concerned with radiation to her right thoracic spine and right lateral ribs.  Because of 6 weeks of symptoms without any improvement, also her past history of cancer and osteoporosis will obtain plain films.  Later in the visit patient began to state that all the pain she was concerned it was more abdominal in her right side of her abdomen which has been previously worked up.  Will discuss with the patient upon x-ray results to continue medication as previously prescribed.  Physical therapy if she is not improving in the next 2-3 weeks - DG Lumbar Spine Complete - DG Thoracic Spine W/Swimmers  2. Flank pain Some of the described pain from her right low back pain does follow from right flank around right hip to right lower quadrant of her abdomen.  He does not seem to be colicky in nature and she had no urinary symptoms however urinalysis was obtained to rule out any concern for urinary involvement.  Urinalysis was negative. Right lower quadrant abdominal pain is reported as constant and unchanged for the past month and has been evaluated by her surgeon and by the ER without any findings.  Will obtain a KUB.  Suspect will be normal, discussed with the patient following up with her surgeon if pain worsens.  She understands ER precautions of severe abdominal  bloating and distention, nausea vomiting, etc. - Urinalysis, Routine w reflex microscopic - Urine Culture - DG Abd 1 View   Pt was seen and evaluated in consultation with Dr. Buelah Manis.  Delsa Grana, PA-C 12/09/17 8:40 AM  Discussed patient with PA-C. Reviewed her recent OV and ER note. Recently completed antibiotics. No diarrhea, blood in stool. No  hematuria, no fever, no N/V/  Flank pain started after helping husband get up. Agree with work up above Note also scheduled for bowel surgery - sigmoidectomy, no symptoms on left side.

## 2017-12-09 NOTE — Patient Instructions (Signed)
Get xrays and we will call you with results  If your stomach pain changes or worsens, then call your surgeon or go to the ER   Low Back Strain A strain is a stretch or tear in a muscle or the strong cords of tissue that attach muscle to bone (tendons). Strains of the lower back (lumbar spine) are a common cause of low back pain. A strain occurs when muscles or tendons are torn or are stretched beyond their limits. The muscles [place] become inflamed, resulting in pain and sudden muscle tightening (spasms). A strain can happen suddenly due to an injury (trauma), or it can develop gradually due to overuse. There are three types of strains:  Grade 1 is a mild strain involving a minor tear of the muscle fibers or tendons. This [place] cause some pain but no loss of muscle strength.  Grade 2 is a moderate strain involving a partial tear of the muscle fibers or tendons. This causes more severe pain and some loss of muscle strength.  Grade 3 is a severe strain involving a complete tear of the muscle or tendon. This causes severe pain and complete or nearly complete loss of muscle strength.  What are the causes? This condition [place] be caused by:  Trauma, such as a fall or a hit to the body.  Twisting or overstretching the back. This [place] result from doing activities that require a lot of energy, such as lifting heavy objects.  What increases the risk? The following factors [place] increase your risk of getting this condition:  Playing contact sports.  Participating in sports or activities that put excessive stress on the back and require a lot of bending and twisting, including: ? Lifting weights or heavy objects. ? Gymnastics. ? Soccer. ? Figure skating. ? Snowboarding.  Being overweight or obese.  Having poor strength and flexibility.  What are the signs or symptoms? Symptoms of this condition [place] include:  Sharp or dull pain in the lower back that does not go away. Pain [place] extend to the  buttocks.  Stiffness.  Limited range of motion.  Inability to stand up straight due to stiffness or pain.  Muscle spasms.  How is this diagnosed? This condition [place] be diagnosed based on:  Your symptoms.  Your medical history.  A physical exam. ? Your health care provider [place] push on certain areas of your back to determine the source of your pain. ? You [place] be asked to bend forward, backward, and side to side to assess the severity of your pain and your range of motion.  Imaging tests, such as: ? X-rays. ? MRI.  How is this treated? Treatment for this condition [place] include:  Applying heat and cold to the affected area.  Medicines to help relieve pain and to relax your muscles (muscle relaxants).  NSAIDs to help reduce swelling and discomfort.  Physical therapy.  When your symptoms improve, it is important to gradually return to your normal routine as soon as possible to reduce pain, avoid stiffness, and avoid loss of muscle strength. Generally, symptoms should improve within 6 weeks of treatment. However, recovery time varies. Follow these instructions at home: Managing pain, stiffness, and swelling  If directed, apply ice to the injured area during the first 24 hours after your injury. ? Put ice in a plastic bag. ? Place a towel between your skin and the bag. ? Leave the ice on for 20 minutes, 2-3 times a day.  If directed, apply heat to  the affected area as often as told by your health care provider. Use the heat source that your health care provider recommends, such as a moist heat pack or a heating pad. ? Place a towel between your skin and the heat source. ? Leave the heat on for 20-30 minutes. ? Remove the heat if your skin turns bright red. This is especially important if you are unable to feel pain, heat, or cold. You [place] have a greater risk of getting burned. Activity  Rest and return to your normal activities as told by your health care provider. Ask your  health care provider what activities are safe for you.  Avoid activities that take a lot of effort (are strenuous) for as long as told by your health care provider.  Do exercises as told by your health care provider. General instructions   Take over-the-counter and prescription medicines only as told by your health care provider.  If you have questions or concerns about safety while taking pain medicine, talk with your health care provider.  Do not drive or operate heavy machinery until you know how your pain medicine affects you.  Do not use any tobacco products, such as cigarettes, chewing tobacco, and e-cigarettes. Tobacco can delay bone healing. If you need help quitting, ask your health care provider.  Keep all follow-up visits as told by your health care provider. This is important. How is this prevented?  Warm up and stretch before being active.  Cool down and stretch after being active.  Give your body time to rest between periods of activity.  Avoid: ? Being physically inactive for long periods at a time. ? Exercising or playing sports when you are tired or in pain.  Use correct form when playing sports and lifting heavy objects.  Use good posture when sitting and standing.  Maintain a healthy weight.  Sleep on a mattress with medium firmness to support your back.  Make sure to use equipment that fits you, including shoes that fit well.  Be safe and responsible while being active to avoid falls.  Do at least 150 minutes of moderate-intensity exercise each week, such as brisk walking or water aerobics. Try a form of exercise that takes stress off your back, such as swimming or stationary cycling.  Maintain physical fitness, including: ? Strength. ? Flexibility. ? Cardiovascular fitness. ? Endurance. Contact a health care provider if:  Your back pain does not improve after 6 weeks of treatment.  Your symptoms get worse. Get help right away if:  Your back  pain is severe.  You are unable to stand or walk.  You develop pain in your legs.  You develop weakness in your buttocks or legs.  You have difficulty controlling when you urinate or when you have a bowel movement. This information is not intended to replace advice given to you by your health care provider. Make sure you discuss any questions you have with your health care provider. Document Released: 08/23/2005 Document Revised: 04/29/2016 Document Reviewed: 06/04/2015 Elsevier Interactive Patient Education  2017 [employer].

## 2017-12-10 LAB — URINE CULTURE
MICRO NUMBER:: 90423009
SPECIMEN QUALITY: ADEQUATE

## 2017-12-12 ENCOUNTER — Telehealth: Payer: Self-pay | Admitting: Family Medicine

## 2017-12-12 ENCOUNTER — Telehealth: Payer: Self-pay | Admitting: Physician Assistant

## 2017-12-12 NOTE — Telephone Encounter (Signed)
Patient called and I gave her the results to her thoracic spine xray and her abdomen xray that was ordered by Hinda Glatter on friday.Abdomen xray showed constipation, and thoracic spine xray showed degenerative changes that were consistent with previous imaging. Patient states that the pain she is having in her side is unbearable. States that it feels like something is burning in her side and has been going on for quite sometime prior to coming in on 12/09/17. Patient would like to know what to do for pain or what the next step is. Please advise?

## 2017-12-12 NOTE — Telephone Encounter (Signed)
Call pt have her come in tomorrow

## 2017-12-12 NOTE — Telephone Encounter (Signed)
Spoke with patient and gave her xray results. Please see Xray in imaging.

## 2017-12-12 NOTE — Telephone Encounter (Signed)
Pt calling about xray results.

## 2017-12-12 NOTE — Telephone Encounter (Signed)
Dr. Buelah Manis saw this patient with [facility]. Will have her review/ f/u.

## 2017-12-13 ENCOUNTER — Ambulatory Visit: Payer: Medicare Other | Admitting: Family Medicine

## 2017-12-13 ENCOUNTER — Other Ambulatory Visit: Payer: Self-pay

## 2017-12-13 ENCOUNTER — Encounter: Payer: Self-pay | Admitting: Family Medicine

## 2017-12-13 VITALS — BP 140/82 | HR 54 | Temp 98.0°F | Resp 14 | Ht 67.0 in | Wt 200.0 lb

## 2017-12-13 DIAGNOSIS — M5136 Other intervertebral disc degeneration, lumbar region: Secondary | ICD-10-CM

## 2017-12-13 DIAGNOSIS — K5901 Slow transit constipation: Secondary | ICD-10-CM | POA: Diagnosis not present

## 2017-12-13 DIAGNOSIS — R1031 Right lower quadrant pain: Secondary | ICD-10-CM | POA: Diagnosis not present

## 2017-12-13 DIAGNOSIS — K632 Fistula of intestine: Secondary | ICD-10-CM

## 2017-12-13 LAB — COMPREHENSIVE METABOLIC PANEL
AG Ratio: 1.4 (calc) (ref 1.0–2.5)
ALT: 9 U/L (ref 6–29)
AST: 14 U/L (ref 10–35)
Albumin: 4.2 g/dL (ref 3.6–5.1)
Alkaline phosphatase (APISO): 102 U/L (ref 33–130)
BILIRUBIN TOTAL: 0.4 mg/dL (ref 0.2–1.2)
BUN / CREAT RATIO: 18 (calc) (ref 6–22)
BUN: 18 mg/dL (ref 7–25)
CO2: 32 mmol/L (ref 20–32)
CREATININE: 1.01 mg/dL — AB (ref 0.60–0.88)
Calcium: 9.5 mg/dL (ref 8.6–10.4)
Chloride: 97 mmol/L — ABNORMAL LOW (ref 98–110)
Globulin: 3.1 g/dL (calc) (ref 1.9–3.7)
Glucose, Bld: 103 mg/dL — ABNORMAL HIGH (ref 65–99)
POTASSIUM: 4.1 mmol/L (ref 3.5–5.3)
Sodium: 136 mmol/L (ref 135–146)
TOTAL PROTEIN: 7.3 g/dL (ref 6.1–8.1)

## 2017-12-13 LAB — CBC WITH DIFFERENTIAL/PLATELET
BASOS ABS: 20 {cells}/uL (ref 0–200)
Basophils Relative: 0.3 %
EOS PCT: 1.1 %
Eosinophils Absolute: 72 cells/uL (ref 15–500)
HEMATOCRIT: 34.1 % — AB (ref 35.0–45.0)
Hemoglobin: 11.1 g/dL — ABNORMAL LOW (ref 11.7–15.5)
LYMPHS ABS: 2347 {cells}/uL (ref 850–3900)
MCH: 28.8 pg (ref 27.0–33.0)
MCHC: 32.6 g/dL (ref 32.0–36.0)
MCV: 88.6 fL (ref 80.0–100.0)
MONOS PCT: 11.1 %
MPV: 9.9 fL (ref 7.5–12.5)
Neutro Abs: 3341 cells/uL (ref 1500–7800)
Neutrophils Relative %: 51.4 %
Platelets: 247 10*3/uL (ref 140–400)
RBC: 3.85 10*6/uL (ref 3.80–5.10)
RDW: 13.7 % (ref 11.0–15.0)
Total Lymphocyte: 36.1 %
WBC mixed population: 722 cells/uL (ref 200–950)
WBC: 6.5 10*3/uL (ref 3.8–10.8)

## 2017-12-13 MED ORDER — AMOXICILLIN 875 MG PO TABS: 875.0000 mg | ORAL_TABLET | Freq: Two times a day (BID) | ORAL | 0 refills | Status: DC

## 2017-12-13 NOTE — Progress Notes (Signed)
Subjective:    Patient ID: [name], female    DOB: 1935-08-15, 83 y.o.   MRN: [id number]  Patient presents for Follow-up Here for interim follow-up I reviewed the notes from my partner who seen her multiple times in the past month as well as the ER note for the beginning of March and my PAs note from last week.  She continues to have right lower quadrant pain states that she also feels it in her hip but is actually pointing to her abdomen.  States is a burning sensation.  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  She has not had any fever no nausea vomiting.  Abdominal x-ray was obtained last week which shows severe constipation she states that she has been taking Imodium when she needs it typically has sometimes 10-20 bowel movements a day this is been going on for years.  She has not noted any change in her bowels.  She denies any urinary symptoms but was unable to leave a sample here today.  She is also had some back discomfort after she tried helping her husband up who fell.  X-ray showed her severe degenerative disc disease but no acute fracture   Last set of antibiotics was on 3/12 Cipro and Flagyl to cover diverticulitis states that she was feeling better when she was on the antibiotics.    Right sided flnk pain-  Review Of Systems:  GEN- denies fatigue, fever, weight loss,weakness, recent illness HEENT- denies eye drainage, change in vision, nasal discharge, CVS- denies chest pain, palpitations RESP- denies SOB, cough, wheeze ABD- denies N/V, change in stools,+ abd pain GU- denies dysuria, hematuria, dribbling, incontinence MSK- + joint pain, muscle aches, injury Neuro- denies headache, dizziness, syncope, seizure activity       Objective:     BP 140/82   Pulse (!) 54   Temp 98 F (36.7 C) (Oral)   Resp 14   Ht 5\' 7"  (1.702 m)   Wt 200 lb (90.7 kg)   SpO2 95%   BMI 31.32 kg/m  GEN- NAD, alert and oriented x3 HEENT- PERRL, EOMI, non injected sclera, pink conjunctiva, MMM, oropharynx clear CVS- RRR, no murmur RESP-CTAB ABD-NABS,soft,MILD ttp RLQ and right flank, no rebound, no guarding EXT- chronci non pitting edema/varicose veins  MSK- Mild TTP lumbar spine, decreased ROM, neg SLR  Pulses- Radial 2+         Assessment & Plan:      Problem List Items Addressed This Visit    None    Visit Diagnoses    RLQ abdominal pain    -  Primary   She has been evaluated Multiple times for this. Unable to leave UA to r/u UTI though doubt pyeloneprhitis, she could have some colitits yet she is full of stool  Plan at this time is to 1 treat constipation with Linzess 72 mg given samples of this.  Next I will go ahead and put her on amoxicillin as she cannot leave a urine sample she does have signs of fistula as well on CT scan which she is having sigmoidectomy and nothing else has treated the pain we will try pain medications muscle relaxers she is on some homeopathic things.  He has have degenerative  disc disease this is unchanged from previous no sign of any fracture no change to her chronic medications for this.  Check her in 2 weeks prior to her having surgery see how she is doing.   Relevant Orders   CBC with Differential/Platelet   Comprehensive metabolic panel   Colocutaneous fistula       DDD (degenerative disc disease), lumbar       Slow transit constipation          Note: This dictation was prepared with Dragon dictation along with smaller phrase technology. Any transcriptional errors that result from this process are unintentional.

## 2017-12-13 NOTE — Patient Instructions (Addendum)
No Immodium Take the linzness for constipation Take antibiotics  Continue pain medication as needed F/U 2 weeks

## 2017-12-13 NOTE — Telephone Encounter (Signed)
Appointment scheduled.

## 2017-12-16 ENCOUNTER — Other Ambulatory Visit (HOSPITAL_COMMUNITY): Payer: Self-pay | Admitting: Cardiology

## 2017-12-23 ENCOUNTER — Other Ambulatory Visit: Payer: Self-pay | Admitting: Physician Assistant

## 2017-12-23 ENCOUNTER — Other Ambulatory Visit: Payer: Self-pay | Admitting: Cardiology

## 2017-12-23 DIAGNOSIS — G4700 Insomnia, unspecified: Secondary | ICD-10-CM

## 2017-12-25 ENCOUNTER — Other Ambulatory Visit: Payer: Self-pay

## 2017-12-25 ENCOUNTER — Emergency Department (HOSPITAL_COMMUNITY): Payer: Medicare Other

## 2017-12-25 ENCOUNTER — Encounter (HOSPITAL_COMMUNITY): Payer: Self-pay

## 2017-12-25 ENCOUNTER — Emergency Department (HOSPITAL_COMMUNITY)
Admission: EM | Admit: 2017-12-25 | Discharge: 2017-12-26 | Disposition: A | Discharge status: Home / Self Care | Payer: Medicare Other | Attending: Emergency Medicine | Admitting: Emergency Medicine

## 2017-12-25 DIAGNOSIS — I10 Essential (primary) hypertension: Secondary | ICD-10-CM | POA: Diagnosis not present

## 2017-12-25 DIAGNOSIS — Z7901 Long term (current) use of anticoagulants: Secondary | ICD-10-CM | POA: Insufficient documentation

## 2017-12-25 DIAGNOSIS — G459 Transient cerebral ischemic attack, unspecified: Secondary | ICD-10-CM

## 2017-12-25 DIAGNOSIS — Z853 Personal history of malignant neoplasm of breast: Secondary | ICD-10-CM | POA: Insufficient documentation

## 2017-12-25 DIAGNOSIS — J449 Chronic obstructive pulmonary disease, unspecified: Secondary | ICD-10-CM | POA: Diagnosis not present

## 2017-12-25 DIAGNOSIS — I252 Old myocardial infarction: Secondary | ICD-10-CM | POA: Insufficient documentation

## 2017-12-25 DIAGNOSIS — Z79899 Other long term (current) drug therapy: Secondary | ICD-10-CM | POA: Diagnosis not present

## 2017-12-25 DIAGNOSIS — I2510 Atherosclerotic heart disease of native coronary artery without angina pectoris: Secondary | ICD-10-CM | POA: Insufficient documentation

## 2017-12-25 DIAGNOSIS — R42 Dizziness and giddiness: Secondary | ICD-10-CM | POA: Diagnosis not present

## 2017-12-25 DIAGNOSIS — R112 Nausea with vomiting, unspecified: Secondary | ICD-10-CM | POA: Diagnosis not present

## 2017-12-25 DIAGNOSIS — R0789 Other chest pain: Secondary | ICD-10-CM | POA: Diagnosis not present

## 2017-12-25 DIAGNOSIS — I4891 Unspecified atrial fibrillation: Secondary | ICD-10-CM | POA: Diagnosis not present

## 2017-12-25 DIAGNOSIS — Z85038 Personal history of other malignant neoplasm of large intestine: Secondary | ICD-10-CM | POA: Insufficient documentation

## 2017-12-25 LAB — PROTIME-INR
INR: 1.9
Prothrombin Time: 21.7 seconds — ABNORMAL HIGH (ref 11.4–15.2)

## 2017-12-25 LAB — BASIC METABOLIC PANEL
ANION GAP: 14 (ref 5–15)
BUN: 23 mg/dL — ABNORMAL HIGH (ref 6–20)
CHLORIDE: 100 mmol/L — AB (ref 101–111)
CO2: 20 mmol/L — AB (ref 22–32)
Calcium: 9.4 mg/dL (ref 8.9–10.3)
Creatinine, Ser: 0.98 mg/dL (ref 0.44–1.00)
GFR calc non Af Amer: 53 mL/min — ABNORMAL LOW (ref >60)
Glucose, Bld: 129 mg/dL — ABNORMAL HIGH (ref 65–99)
Potassium: 4.1 mmol/L (ref 3.5–5.1)
Sodium: 134 mmol/L — ABNORMAL LOW (ref 135–145)

## 2017-12-25 LAB — CBC
HCT: 37.5 % (ref 36.0–46.0)
HEMOGLOBIN: 12.1 g/dL (ref 12.0–15.0)
MCH: 29 pg (ref 26.0–34.0)
MCHC: 32.3 g/dL (ref 30.0–36.0)
MCV: 89.9 fL (ref 78.0–100.0)
Platelets: 284 10*3/uL (ref 150–400)
RBC: 4.17 MIL/uL (ref 3.87–5.11)
RDW: 15 % (ref 11.5–15.5)
WBC: 8.3 10*3/uL (ref 4.0–10.5)

## 2017-12-25 LAB — I-STAT TROPONIN, ED: Troponin i, poc: 0.01 ng/mL (ref 0.00–0.08)

## 2017-12-25 NOTE — ED Triage Notes (Signed)
Pt BIB [facility] for eval of severe dizziness acute onset this evening w/ accompanying vomiting. Pt reports 10+episodes of vomiting since 1800. Pt arrives profusely diaphoretic, c/o dull chest pain, actively vomiting and c/o severe vertigo/dizziness. Pt denies SOB. Pt is currently anticoagulated on warfarin. Denies hx of head trauma/falls.

## 2017-12-25 NOTE — ED Notes (Signed)
Pt reports coming to the hospital d/t N/V denied dizziness.

## 2017-12-25 NOTE — ED Provider Notes (Signed)
[facility] Provider Note   CSN: [id number] Arrival date & time: 12/25/17  2101     History   Chief Complaint Chief Complaint  Patient presents with  . Dizziness    HPI Galit Urich Peoples is a 83 y.o. female.  Patient here for evaluation of dizziness this evening associated with nausea and vomiting. She states that symptoms started this afternoon after she broke into a cold sweat. No diarrhea, fever, cough, SOB, congestion. She had an episode of chest pain this morning and took 2 of her NTG with resolution. She states these were typical symptoms that she has regularly. No recurrent chest pain after that morning episode.   The history is provided by the patient and a relative. No language interpreter was used.    Past Medical History:  Diagnosis Date  . Allergy    rhinitis  . Aortic stenosis    mild by echo 06/2017  . Arthritis   . Bradycardia    a. 10/2017 -> beta blocker cut back due to HR 39.  . Breast cancer ([place]) 01/06/2012  . Cancer [facility])    right colon and left breast  . Chronic diastolic CHF (congestive heart failure) ([place])   . Colon cancer ([place]) 01/06/2012  . COPD (chronic obstructive pulmonary disease) ([place])   . Coronary artery disease 2006   a.  NSTEMI in 2016, cath showed 15% prox-mid RCA, 20% prox LAD, EF 25-35% by cath and 35-40% -> felt due to Takotsubo cardiomyopathy.  . Dilated aortic root ([place])    46mmHg by echo 06/2017  . Diverticulosis   . Edema extremities   . GERD (gastroesophageal reflux disease)   . Hernia   . Hiatal hernia    denies  . Hyperlipidemia   . Hypertension   . Mild aortic stenosis    echo 11/2015 but not noted on echo 06/2016  . Osteopenia   . Permanent atrial fibrillation ([facility])    chronic atrial fibrillation  . Pneumonia    hx child  . Pulmonary HTN ([place])    a. moderate to severe PASP 77mmHg echo 11/2015 - now 65mmHg by echo 06/2017. CTA chest in 11/16 with no PE. PFTs in 7/15 with mild obstructive  lung disease. She had a negative sleep study in 2017. b. Felt due to left sided HF.  . Takotsubo syndrome 07/29/2015   a. EF 35-40% by echo; akinesis of mid-apical anteroseptal and apical myocardium.  EF now normalized on echo 11/2015    Patient Active Problem List   Diagnosis Date Noted  . Breast cancer ([place]) 09/13/2017  . Obesity, unspecified 09/13/2017  . Warfarin anticoagulation 09/13/2017  . Uterine mass 09/07/2017  . Adnexal mass 08/03/2017  . Pelvic abscess in female 07/07/2017  . Aortic stenosis   . Dyslipidemia, goal LDL below 70 11/28/2016  . Monitoring for long-term anticoagulant use 12/31/2015  . Chronic diastolic CHF (congestive heart failure) ([place]) 10/28/2015  . Insomnia 10/23/2015  . Leg swelling 08/11/2015  . Takotsubo syndrome 07/29/2015  . NSTEMI (non-ST elevated myocardial infarction) ([place]) 07/28/2015  . SOB (shortness of breath) 08/15/2014  . Dilated aortic root ([place])   . Pulmonary HTN ([place])   . Permanent atrial fibrillation ([place]) 07/04/2013  . Chronic anticoagulation 07/04/2013  . Coronary artery disease   . Lesion of nipple 05/15/2013  . GERD (gastroesophageal reflux disease)   . Hypertension   . COPD (chronic obstructive pulmonary disease) ([place])   . Osteopenia   . Diverticulosis   .  Hiatal hernia   . Abdominal pain 01/11/2012  . Breast cancer of upper-outer quadrant of left female breast ([place]) 01/06/2012  . Colon cancer ([place]) 01/06/2012    Past Surgical History:  Procedure Laterality Date  . APPENDECTOMY    . BREAST SURGERY     lumpectomy left  . CARDIAC CATHETERIZATION    . CARDIAC CATHETERIZATION N/A 07/28/2015   Procedure: Left Heart Cath and Coronary Angiography;  Surgeon: Peter M Martinique, MD;  Location: [facility];  Service: Cardiovascular;  Laterality: N/A;  . CHOLECYSTECTOMY    . COLECTOMY     right side  . EXCISION OF ACCESSORY NIPPLE Bilateral 05/30/2013   Procedure: BILATERAL NIPPLE BIOPSY;  Surgeon: Merrie Roof, MD;   Location: [facility];  Service: General;  Laterality: Bilateral;  . EYE SURGERY Bilateral 12   cataracts  . IR RADIOLOGIST EVAL & MGMT  07/26/2017  . MASTECTOMY PARTIAL / LUMPECTOMY  2008   left breast  . SPLIT NIGHT STUDY  02/02/2016         OB History    Gravida  2   Para  1   Term  1   Preterm      AB  1   Living  1     SAB  1   TAB      Ectopic      Multiple      Live Births  1            Home Medications    Prior to Admission medications   Medication Sig Start Date End Date Taking? Authorizing Provider  acetaminophen (TYLENOL) 500 MG tablet Take 500-1,000 mg by mouth every 6 (six) hours as needed for headache (pain).   Yes [provider]  amLODipine (NORVASC) 5 MG tablet TAKE ONE TABLET BY MOUTH EVERY DAY 11/23/17  Yes Larey Dresser, MD  augmented betamethasone dipropionate (DIPROLENE-AF) 0.05 % ointment Apply 1 application topically 2 (two) times daily as needed (rash/dry skin).    Yes [provider]  cetirizine (ZYRTEC) 10 MG tablet Take 10 mg by mouth daily as needed for allergies.   Yes [provider]  clonazePAM (KLONOPIN) 0.5 MG tablet TAKE 1 TABLET BY MOUTH TWICE DAILY AS NEEDED FOR ANXIETY Patient taking differently: TAKE 1 TABLET (0.5 MG) BY MOUTH TWICE DAILY AS NEEDED FOR ANXIETY 09/26/17  Yes Dixon, Mary B, PA-C  cloNIDine (CATAPRES) 0.1 MG tablet TAKE ONE TABLET BY MOUTH TWICE DAILY Patient taking differently: TAKE ONE TABLET (0.1 MG) BY MOUTH TWICE DAILY 12/14/16  Yes Turner, Eber Hong, MD  Guaifenesin (MUCINEX MAXIMUM STRENGTH) 1200 MG TB12 Take 1,200 mg by mouth daily as needed (congestion).   Yes [provider]  HYDROcodone-acetaminophen (NORCO) 7.5-325 MG tablet Take 1 tablet by mouth every 6 (six) hours as needed for severe pain. 12/05/17  Yes [name], MD  losartan (COZAAR) 100 MG tablet TAKE ONE TABLET BY MOUTH EVERY DAY 11/23/17  Yes Larey Dresser, MD  metoprolol succinate (TOPROL XL) 25 MG 24 hr  tablet Take 1 tablet (25 mg total) by mouth daily. 10/25/17  Yes Duke, Tami Lin, PA  mirtazapine (REMERON) 15 MG tablet TAKE 1 TABLET BY MOUTH AT BEDTIME 11/08/17  Yes Dixon, Mary B, PA-C  nitroGLYCERIN (NITROSTAT) 0.4 MG SL tablet PLACE ONE TABLET UNDER THE TONGUE EVERY FIVE MINUTES AS NEEDED FOR CHEST PAIN. [place] REPEAT FOR THREE DOSES. 09/01/17  Yes Dena Billet B, PA-C  omeprazole (PRILOSEC OTC) 20  MG tablet Take 20 mg by mouth daily with supper.   Yes [provider]  OVER THE COUNTER MEDICATION Place 1 drop into both eyes daily as needed (dry eyes). Over the counter lubricating eye drops   Yes [provider]  spironolactone (ALDACTONE) 25 MG tablet TAKE 1/2 (ONE-HALF) TABLET BY MOUTH ONCE DAILY 12/16/17  Yes Turner, Eber Hong, MD  torsemide (DEMADEX) 20 MG tablet Take 60 mg by mouth daily.    Yes [provider]  warfarin (COUMADIN) 1 MG tablet Take 0.5 tablets (0.5 mg total) by mouth as directed. M-W-F-SA Patient taking differently: Take 3 mg by mouth. Take 3 mg by mouth at bedtime Monday, Wednesday, Friday, Saturday 07/08/16  Yes Dixon, Stanton Kidney B, PA-C  warfarin (COUMADIN) 5 MG tablet TAKE ONE-HALF TABLET (2.5MG ) BY MOUTH DAILY. (ON MONDAY, WEDNESDAY, FRIDAY ANDSATURDAY, ADD ONE-HALF TABLET OF WARFARIN 1MG ). Patient taking differently: 2.5 mg Sun,T,TH 09/08/17  Yes Dixon, Mary B, PA-C  tiZANidine (ZANAFLEX) 4 MG tablet Take 1 tablet (4 mg total) by mouth every 8 (eight) hours as needed for muscle spasms. Patient not taking: Reported on 12/25/2017 11/24/17   [name], MD    Family History Family History  Problem Relation Age of Onset  . Heart disease Mother   . Heart attack Mother   . Cancer Sister        stomach and colon  . Heart disease Brother 31  . Hypertension Father   . Cancer Sister   . Stroke Neg Hx     Social History Social History   Tobacco Use  . Smoking status: Never Smoker  . Smokeless tobacco: Never Used  Substance Use Topics  .  Alcohol use: No  . Drug use: No     Allergies   Contrast media [iodinated diagnostic agents]; Clonidine derivatives; Statins; Sulfa antibiotics; Celebrex [celecoxib]; Isosorbide nitrate; Other; and Tape   Review of Systems Review of Systems  Constitutional: Positive for diaphoresis. Negative for chills and fever.  HENT: Negative.   Respiratory: Negative.  Negative for cough and shortness of breath.   Cardiovascular: Positive for chest pain (This morning).  Gastrointestinal: Positive for nausea and vomiting. Negative for abdominal pain and diarrhea.  Genitourinary: Negative.   Musculoskeletal: Negative.   Skin: Negative.   Neurological: Positive for dizziness.     Physical Exam Updated Vital Signs BP (!) 179/88 (BP Location: Right Arm)   Pulse 78   Temp 97.6 F (36.4 C) (Oral)   Resp (!) 24   Ht 5\' 6"  (1.676 m)   Wt 90.7 kg (200 lb)   SpO2 100%   BMI 32.28 kg/m   Physical Exam  Constitutional: She is oriented to person, place, and time. She appears well-developed and well-nourished.  HENT:  Head: Normocephalic.  Neck: Normal range of motion. Neck supple.  Cardiovascular: Normal rate and regular rhythm.  No carotid bruit  Pulmonary/Chest: Effort normal and breath sounds normal. She has no wheezes. She has no rales.  Abdominal: Soft. Bowel sounds are normal. There is no tenderness. There is no rebound and no guarding.  Musculoskeletal: Normal range of motion.  Neurological: She is alert and oriented to person, place, and time.  CN's 3-12 grossly intact. Speech is clear and focused. No facial asymmetry. No lateralizing weakness. Reflexes are equal. No deficits of coordination by finger-to-nose. No pronator drift.     Skin: Skin is warm and dry. No rash noted.  Psychiatric: She has a normal mood and affect.  ED Treatments / Results  Labs (all labs ordered are listed, but only abnormal results are displayed) Labs Reviewed  BASIC METABOLIC PANEL - Abnormal;  Notable for the following components:      Result Value   Sodium 134 (*)    Chloride 100 (*)    CO2 20 (*)    Glucose, Bld 129 (*)    BUN 23 (*)    GFR calc non Af Amer 53 (*)    All other components within normal limits  PROTIME-INR - Abnormal; Notable for the following components:   Prothrombin Time 21.7 (*)    All other components within normal limits  CBC  URINALYSIS, ROUTINE W REFLEX MICROSCOPIC  I-STAT TROPONIN, ED  CBG MONITORING, ED   Results for orders placed or performed during the hospital encounter of 47/42/59  Basic metabolic panel  Result Value Ref Range   Sodium 134 (L) 135 - 145 mmol/L   Potassium 4.1 3.5 - 5.1 mmol/L   Chloride 100 (L) 101 - 111 mmol/L   CO2 20 (L) 22 - 32 mmol/L   Glucose, Bld 129 (H) 65 - 99 mg/dL   BUN 23 (H) 6 - 20 mg/dL   Creatinine, Ser 0.98 0.44 - 1.00 mg/dL   Calcium 9.4 8.9 - 10.3 mg/dL   GFR calc non Af Amer 53 (L) >60 mL/min   GFR calc Af Amer >60 >60 mL/min   Anion gap 14 5 - 15  CBC  Result Value Ref Range   WBC 8.3 4.0 - 10.5 K/uL   RBC 4.17 3.87 - 5.11 MIL/uL   Hemoglobin 12.1 12.0 - 15.0 g/dL   HCT 37.5 36.0 - 46.0 %   MCV 89.9 78.0 - 100.0 fL   MCH 29.0 26.0 - 34.0 pg   MCHC 32.3 30.0 - 36.0 g/dL   RDW 15.0 11.5 - 15.5 %   Platelets 284 150 - 400 K/uL  Urinalysis, Routine w reflex microscopic  Result Value Ref Range   Color, Urine YELLOW YELLOW   APPearance CLEAR CLEAR   Specific Gravity, Urine 1.012 1.005 - 1.030   pH 5.0 5.0 - 8.0   Glucose, UA NEGATIVE NEGATIVE mg/dL   Hgb urine dipstick SMALL (A) NEGATIVE   Bilirubin Urine NEGATIVE NEGATIVE   Ketones, ur NEGATIVE NEGATIVE mg/dL   Protein, ur 30 (A) NEGATIVE mg/dL   Nitrite NEGATIVE NEGATIVE   Leukocytes, UA NEGATIVE NEGATIVE   RBC / HPF 0-5 0 - 5 RBC/hpf   WBC, UA 0-5 0 - 5 WBC/hpf   Bacteria, UA RARE (A) NONE SEEN   Squamous Epithelial / LPF 0-5 (A) NONE SEEN   Mucus PRESENT   Protime-INR  Result Value Ref Range   Prothrombin Time 21.7 (H) 11.4 -  15.2 seconds   INR 1.90   I-Stat Troponin, ED (not at [facility])  Result Value Ref Range   Troponin i, poc 0.01 0.00 - 0.08 ng/mL   Comment 3            EKG None  Radiology Ct Head Wo Contrast  Result Date: 12/25/2017 CLINICAL DATA:  Dizziness, vomiting EXAM: CT HEAD WITHOUT CONTRAST TECHNIQUE: Contiguous axial images were obtained from the base of the skull through the vertex without intravenous contrast. COMPARISON:  None. FINDINGS: Brain: There is atrophy and chronic small vessel disease changes. No acute intracranial abnormality. Specifically, no hemorrhage, hydrocephalus, mass lesion, acute infarction, or significant intracranial injury. Vascular: No hyperdense vessel or unexpected calcification. Skull: No acute calvarial abnormality. Sinuses/Orbits: Visualized paranasal  sinuses and mastoids clear. Orbital soft tissues unremarkable. Other: None IMPRESSION: No acute intracranial abnormality. Atrophy, chronic microvascular disease. Electronically Signed   By: Rolm Baptise M.D.   On: 12/25/2017 21:40    Procedures Procedures (including critical care time)  Medications Ordered in ED Medications - No data to display   Initial Impression / Assessment and Plan / ED Course  I have reviewed the triage vital signs and the nursing notes.  Pertinent labs & imaging results that were available during my care of the patient were reviewed by me and considered in my medical decision making (see chart for details).     Patient presents with symptoms of vertigo - severe dizziness, nausea, vomiting, started this afternoon. Currently feeling better after Zofran only. Labs pending. CT scan without acute finding.   Labs are reassuring. She continues to feel much better than before. She has been able to ambulate without difficulty. VSS. She has been seen by Dr. Kathrynn Humble.   DDx: vertigo, although no clear description of surroundings spinning; TIA.   Discussed with the patient and family being admitted for  TIA work up but given the option of close follow up with neurology in the outpatient setting. Patient prefers to be discharged home. Strict return precautions discussed with patient and son, who is at bedside. Both are comfortable with plan of discharge.   Final Clinical Impressions(s) / ED Diagnoses   Final diagnoses:  None   1. Nausea and vomiting 2. TIA  ED Discharge Orders    None       Charlann Lange, Hershal Coria 12/26/17 [id number]    Varney Biles, MD 12/27/17 [phone]

## 2017-12-26 LAB — URINALYSIS, ROUTINE W REFLEX MICROSCOPIC
BILIRUBIN URINE: NEGATIVE
GLUCOSE, UA: NEGATIVE mg/dL
Ketones, ur: NEGATIVE mg/dL
Leukocytes, UA: NEGATIVE
NITRITE: NEGATIVE
PH: 5 (ref 5.0–8.0)
Protein, ur: 30 mg/dL — AB
SPECIFIC GRAVITY, URINE: 1.012 (ref 1.005–1.030)

## 2017-12-26 MED ORDER — ONDANSETRON 4 MG PO TBDP: 4.0000 mg | ORAL_TABLET | Freq: Three times a day (TID) | ORAL | 0 refills | Status: DC | PRN

## 2017-12-26 NOTE — Discharge Instructions (Signed)
Please call [facility] to schedule an appointment as soon as possible for outpatient evaluation of TIA (transient ischemic attack). Return to the emergency department with any new or concerning symptoms. Zofran for nausea as needed.

## 2017-12-28 ENCOUNTER — Ambulatory Visit: Payer: Medicare Other | Admitting: Physician Assistant

## 2017-12-28 ENCOUNTER — Other Ambulatory Visit: Payer: Self-pay

## 2017-12-28 ENCOUNTER — Encounter: Payer: Self-pay | Admitting: Physician Assistant

## 2017-12-28 VITALS — BP 142/78 | HR 65 | Temp 98.0°F | Resp 16 | Ht 67.0 in | Wt 196.0 lb

## 2017-12-28 DIAGNOSIS — Z09 Encounter for follow-up examination after completed treatment for conditions other than malignant neoplasm: Secondary | ICD-10-CM

## 2017-12-28 NOTE — Progress Notes (Signed)
Patient ID: [name] MRN: [id number], DOB: Nov 25, 1934, 83 y.o. Date of Encounter: @DATE @  Chief Complaint:  Chief Complaint  Patient presents with  . ER FOLLOW UP    HPI: 83 y.o. year old female  presents aloe up after recent ER visit.  Today I have reviewed her ER visit note from 12/25/17. The following is copied directly from that note:  HPI: Patient here for evaluation of dizziness this evening associated with nausea and vomiting. She states that symptoms started this afternoon after she broke into a cold sweat. No diarrhea, fever, cough, SOB, congestion. She had an episode of chest pain this morning and took 2 of her NTG with resolution. She states these were typical symptoms that she has regularly. No recurrent chest pain after that morning episode   83 year old comes in with chief complaint of dizziness.  Patient started having sudden onset dizziness this evening with associated nausea and vomiting.  Patient also had diaphoresis.  Dizziness is described as lightheadedness leading to ataxic gait.  Patient denies any vertigo to me.  By the time I saw the patient was feeling better.  I ambulated the patient personally and she was not ataxic.  Her neurologic exam was normal and particularly the cerebellar exam did not reveal dysmetria.  I discussed with the patient that there is a small possibility that her symptoms could have been due to TIA. -And if that is the case that she is at risk for having a stroke.  We discussed the pros and cons of coming into the hospital versus going home with close follow-up, and patient has decided to choose the latter.  Strict ER return precautions have been discussed with the patient and his family member, and she will return to the ER if she has any new symptoms that are persistent -including nausea, diaphoresis, dizziness, unilateral weakness or  numbness.  -------------------------------------------------------------------------------------------------------------------------------------------------------------------------------------------------------------------------------------------------------------------------------   Today I have also reviewed that she had CT head 12/25/17 that revealed no acute abnormality.  Today she reports that she has had no recurrence of symptoms similar to what she experienced that prompted her ER visit on 12/25/17. She has had no further lightheadedness or "dizziness ".  No further ataxic gait.  No further nausea or vomiting.  She reports that she does have follow-up appointment scheduled to see neurology on [place] [address].  She is aware of this appointment and does plan to go to this appointment.  She has no specific concerns to address today.     Past Medical History:  Diagnosis Date  . Allergy    rhinitis  . Aortic stenosis    mild by echo 06/2017  . Arthritis   . Bradycardia    a. 10/2017 -> beta blocker cut back due to HR 39.  . Breast cancer ([place]) 01/06/2012  . Cancer [facility])    right colon and left breast  . Chronic diastolic CHF (congestive heart failure) ([place])   . Colon cancer ([place]) 01/06/2012  . COPD (chronic obstructive pulmonary disease) ([place])   . Coronary artery disease 2006   a.  NSTEMI in 2016, cath showed 15% prox-mid RCA, 20% prox LAD, EF 25-35% by cath and 35-40% -> felt due to Takotsubo cardiomyopathy.  . Dilated aortic root ([place])    63mmHg by echo 06/2017  . Diverticulosis   . Edema extremities   . GERD (gastroesophageal reflux disease)   . Hernia   . Hiatal hernia    denies  . Hyperlipidemia   . Hypertension   .  Mild aortic stenosis    echo 11/2015 but not noted on echo 06/2016  . Osteopenia   . Permanent atrial fibrillation ([facility])    chronic atrial fibrillation  . Pneumonia    hx child  . Pulmonary HTN ([place])    a. moderate to severe PASP 67mmHg echo 11/2015 - now 26mmHg by  echo 06/2017. CTA chest in 11/16 with no PE. PFTs in 7/15 with mild obstructive lung disease. She had a negative sleep study in 2017. b. Felt due to left sided HF.  . Takotsubo syndrome 07/29/2015   a. EF 35-40% by echo; akinesis of mid-apical anteroseptal and apical myocardium.  EF now normalized on echo 11/2015     Home Meds: Outpatient Medications Prior to Visit  Medication Sig Dispense Refill  . acetaminophen (TYLENOL) 500 MG tablet Take 500-1,000 mg by mouth every 6 (six) hours as needed for headache (pain).    Marland Kitchen amLODipine (NORVASC) 5 MG tablet TAKE ONE TABLET BY MOUTH EVERY DAY 30 tablet 3  . augmented betamethasone dipropionate (DIPROLENE-AF) 0.05 % ointment Apply 1 application topically 2 (two) times daily as needed (rash/dry skin).     . cetirizine (ZYRTEC) 10 MG tablet Take 10 mg by mouth daily as needed for allergies.    . clonazePAM (KLONOPIN) 0.5 MG tablet TAKE 1 TABLET BY MOUTH TWICE DAILY AS NEEDED FOR ANXIETY (Patient taking differently: TAKE 1 TABLET (0.5 MG) BY MOUTH TWICE DAILY AS NEEDED FOR ANXIETY) 60 tablet 2  . cloNIDine (CATAPRES) 0.1 MG tablet Take 1 tablet (0.1 mg total) by mouth 2 (two) times daily. 180 tablet 2  . Guaifenesin (MUCINEX MAXIMUM STRENGTH) 1200 MG TB12 Take 1,200 mg by mouth daily as needed (congestion).    Marland Kitchen HYDROcodone-acetaminophen (NORCO) 7.5-325 MG tablet Take 1 tablet by mouth every 6 (six) hours as needed for severe pain. 30 tablet 0  . losartan (COZAAR) 100 MG tablet TAKE ONE TABLET BY MOUTH EVERY DAY 30 tablet 3  . metoprolol succinate (TOPROL XL) 25 MG 24 hr tablet Take 1 tablet (25 mg total) by mouth daily. 30 tablet 1  . mirtazapine (REMERON) 15 MG tablet TAKE 1 TABLET BY MOUTH AT BEDTIME 90 tablet 0  . nitroGLYCERIN (NITROSTAT) 0.4 MG SL tablet PLACE ONE TABLET UNDER THE TONGUE EVERY FIVE MINUTES AS NEEDED FOR CHEST PAIN. [place] REPEAT FOR THREE DOSES. 30 tablet 2  . omeprazole (PRILOSEC OTC) 20 MG tablet Take 20 mg by mouth daily with supper.     . ondansetron (ZOFRAN ODT) 4 MG disintegrating tablet Take 1 tablet (4 mg total) by mouth every 8 (eight) hours as needed for nausea or vomiting. 20 tablet 0  . OVER THE COUNTER MEDICATION Place 1 drop into both eyes daily as needed (dry eyes). Over the counter lubricating eye drops    . spironolactone (ALDACTONE) 25 MG tablet TAKE 1/2 (ONE-HALF) TABLET BY MOUTH ONCE DAILY 45 tablet 3  . tiZANidine (ZANAFLEX) 4 MG tablet Take 1 tablet (4 mg total) by mouth every 8 (eight) hours as needed for muscle spasms. 30 tablet 0  . torsemide (DEMADEX) 20 MG tablet Take 60 mg by mouth daily.     Marland Kitchen warfarin (COUMADIN) 1 MG tablet Take 0.5 tablets (0.5 mg total) by mouth as directed. M-W-F-SA (Patient taking differently: Take 3 mg by mouth. Take 3 mg by mouth at bedtime Monday, Wednesday, Friday, Saturday) 90 tablet 2  . warfarin (COUMADIN) 5 MG tablet TAKE ONE-HALF TABLET (2.5MG ) BY MOUTH DAILY. (ON MONDAY, [place], FRIDAY ANDSATURDAY,  ADD ONE-HALF TABLET OF WARFARIN 1MG ). (Patient taking differently: 2.5 mg Sun,T,TH,Sat) 90 tablet 2   No facility-administered medications prior to visit.     Allergies:  Allergies  Allergen Reactions  . Contrast Media [Iodinated Diagnostic Agents] Hives and Other (See Comments)    Per pt strong burning sensation starting in chest radiating outward   . Clonidine Derivatives Other (See Comments)    Throat dry  . Statins Other (See Comments)    "bones hurt"  . Sulfa Antibiotics Diarrhea    Tremors   . Celebrex [Celecoxib] Rash  . Isosorbide Nitrate Itching and Rash  . Other Itching and Rash    Plastic and paper tape and heart monitor pads  . Tape Itching and Rash    Red Where applied and will spread    Social History   Socioeconomic History  . Marital status: Married    Spouse name: Not on file  . Number of children: Not on file  . Years of education: Not on file  . Highest education level: Not on file  Occupational History  . Not on file  Social Needs   . Financial resource strain: Not on file  . Food insecurity:    Worry: Not on file    Inability: Not on file  . Transportation needs:    Medical: Not on file    Non-medical: Not on file  Tobacco Use  . Smoking status: Never Smoker  . Smokeless tobacco: Never Used  Substance and Sexual Activity  . Alcohol use: No  . Drug use: No  . Sexual activity: Not Currently  Lifestyle  . Physical activity:    Days per week: Not on file    Minutes per session: Not on file  . Stress: Not on file  Relationships  . Social connections:    Talks on phone: Not on file    Gets together: Not on file    Attends religious service: Not on file    Active member of club or organization: Not on file    Attends meetings of clubs or organizations: Not on file    Relationship status: Not on file  . Intimate partner violence:    Fear of current or ex partner: Not on file    Emotionally abused: Not on file    Physically abused: Not on file    Forced sexual activity: Not on file  Other Topics Concern  . Not on file  Social History Narrative  . Not on file    Family History  Problem Relation Age of Onset  . Heart disease Mother   . Heart attack Mother   . Cancer Sister        stomach and colon  . Heart disease Brother 24  . Hypertension Father   . Cancer Sister   . Stroke Neg Hx      Review of Systems:  See HPI for pertinent ROS. All other ROS negative.    Physical Exam: Blood pressure (!) 142/78, pulse 65, temperature 98 F (36.7 C), temperature source Oral, resp. rate 16, height 5\' 7"  (1.702 m), weight 88.9 kg (196 lb), SpO2 97 %., Body mass index is 30.7 kg/m. General: WF. Appears in no acute distress. Neck: Supple. No thyromegaly. No lymphadenopathy. Lungs: Clear bilaterally to auscultation without wheezes, rales, or rhonchi. Breathing is unlabored. Heart: Irregular Musculoskeletal:  Strength and tone normal for age. Extremities/Skin: Warm and dry.  Neuro: Alert and oriented X 3.  Moves all extremities spontaneously. Gait is normal.  CNII-XII grossly in tact. Psych:  Responds to questions appropriately with a normal affect.     ASSESSMENT AND PLAN:  83 y.o. year old female with   1. Hospital discharge follow-up She has remained stable with no recurrent symptoms since her ER visit.  She is aware of follow-up appointment with neurology and plans to go to that appointment for further evaluation.  Follow-up sooner if needed.   Signed, [address] [place], [place], [facility] 12/28/2017 10:01 AM

## 2018-01-04 ENCOUNTER — Encounter: Payer: Self-pay | Admitting: Neurology

## 2018-01-04 ENCOUNTER — Ambulatory Visit: Payer: Medicare Other | Admitting: Neurology

## 2018-01-04 VITALS — BP 152/65 | HR 48 | Ht 67.0 in | Wt 196.8 lb

## 2018-01-04 DIAGNOSIS — I482 Chronic atrial fibrillation: Secondary | ICD-10-CM

## 2018-01-04 DIAGNOSIS — R1031 Right lower quadrant pain: Secondary | ICD-10-CM

## 2018-01-04 DIAGNOSIS — I4821 Permanent atrial fibrillation: Secondary | ICD-10-CM

## 2018-01-04 DIAGNOSIS — Z7901 Long term (current) use of anticoagulants: Secondary | ICD-10-CM

## 2018-01-04 DIAGNOSIS — G459 Transient cerebral ischemic attack, unspecified: Secondary | ICD-10-CM

## 2018-01-04 MED ORDER — APIXABAN 2.5 MG PO TABS: 5.0000 mg | ORAL_TABLET | Freq: Two times a day (BID) | ORAL | Status: DC

## 2018-01-04 NOTE — Patient Instructions (Addendum)
[name]  01/04/2018   Your procedure is scheduled on: 01-10-18   Report to [facility] Main  Entrance    Report to admitting at 5:30 AM    Call this number if you have problems the morning of surgery [phone]   Remember:DRINK 2 PRESURGERY ENSURE DRINKS THE NIGHT BEFORE SURGERY AT  1000 PM AND 1 PRESURGERY DRINK THE DAY OF THE PROCEDURE 3 HOURS PRIOR TO SCHEDULED SURGERY. NO SOLIDS AFTER MIDNIGHT THE DAY PRIOR TO THE SURGERY. NOTHING BY MOUTH EXCEPT CLEAR LIQUIDS UNTIL THREE HOURS PRIOR TO SCHEDULED SURGERY. PLEASE FINISH PRESURGERY ENSURE DRINK PER SURGEON ORDER 3 HOURS PRIOR TO SCHEDULED SURGERY TIME WHICH NEEDS TO BE COMPLETED AT 4:30 AM.     CLEAR LIQUID DIET   Foods Allowed                                                                     Foods Excluded  Coffee and tea, regular and decaf                             liquids that you cannot  Plain Jell-O in any flavor                                             see through such as: Fruit ices (not with fruit pulp)                                     milk, soups, orange juice  Iced Popsicles                                    All solid food Carbonated beverages, regular and diet                                    Cranberry, grape and apple juices Sports drinks like Gatorade Lightly seasoned clear broth or consume(fat free) Sugar, honey syrup  Sample Menu Breakfast                                Lunch                                     Supper Cranberry juice                    Beef broth                            Chicken broth Jell-O  Grape juice                           Apple juice Coffee or tea                        Jell-O                                      Popsicle                                                Coffee or tea                        Coffee or tea  _____________________________________________________________________    Take these medicines  the morning of surgery with A SIP OF WATER: Amlodipine (Norvasc), Clonidine (Catapres), and Metoprolol Succinate (Lopressor). You Teo also bring and use your eyedrops as needed.                                You Santucci not have any metal on your body including hair pins and              piercings  Do not wear jewelry, make-up, lotions, powders or perfumes, deodorant             Do not wear nail polish.  Do not shave  48 hours prior to surgery.                Do not bring valuables to the hospital. [place].  Contacts, dentures or bridgework [place] not be worn into surgery.  Leave suitcase in the car. After surgery it Zarr be brought to your room.   Special Instructions: Please follow your bowel prep per your MD's Instructions                Please read over the following fact sheets you were given: _____________________________________________________________________           [facility] - Preparing for Surgery Before surgery, you can play an important role.  Because skin is not sterile, your skin needs to be as free of germs as possible.  You can reduce the number of germs on your skin by washing with CHG (chlorahexidine gluconate) soap before surgery.  CHG is an antiseptic cleaner which kills germs and bonds with the skin to continue killing germs even after washing. Please DO NOT use if you have an allergy to CHG or antibacterial soaps.  If your skin becomes reddened/irritated stop using the CHG and inform your nurse when you arrive at Short Stay. Do not shave (including legs and underarms) for at least 48 hours prior to the first CHG shower.  You Maxon shave your face/neck. Please follow these instructions carefully:  1.  Shower with CHG Soap the night before surgery and the  morning of Surgery.  2.  If you choose to wash your hair, wash your hair first as usual with your  normal  shampoo.  3.  After you  shampoo, rinse your hair and body  thoroughly to remove the  shampoo.                           4.  Use CHG as you would any other liquid soap.  You can apply chg directly  to the skin and wash                       Gently with a scrungie or clean washcloth.  5.  Apply the CHG Soap to your body ONLY FROM THE NECK DOWN.   Do not use on face/ open                           Wound or open sores. Avoid contact with eyes, ears mouth and genitals (private parts).                       Wash face,  Genitals (private parts) with your normal soap.             6.  Wash thoroughly, paying special attention to the area where your surgery  will be performed.  7.  Thoroughly rinse your body with warm water from the neck down.  8.  DO NOT shower/wash with your normal soap after using and rinsing off  the CHG Soap.                9.  Pat yourself dry with a clean towel.            10.  Wear clean pajamas.            11.  Place clean sheets on your bed the night of your first shower and do not  sleep with pets. Day of Surgery : Do not apply any lotions/deodorants the morning of surgery.  Please wear clean clothes to the hospital/surgery center.  FAILURE TO FOLLOW THESE INSTRUCTIONS [place] RESULT IN THE CANCELLATION OF YOUR SURGERY PATIENT SIGNATURE_________________________________  NURSE SIGNATURE__________________________________  ________________________________________________________________________  WHAT IS A BLOOD TRANSFUSION? Blood Transfusion Information  A transfusion is the replacement of blood or some of its parts. Blood is made up of multiple cells which provide different functions.  Red blood cells carry oxygen and are used for blood loss replacement.  White blood cells fight against infection.  Platelets control bleeding.  Plasma helps clot blood.  Other blood products are available for specialized needs, such as hemophilia or other clotting disorders. BEFORE THE TRANSFUSION  Who gives blood for transfusions?   Healthy  volunteers who are fully evaluated to make sure their blood is safe. This is blood bank blood. Transfusion therapy is the safest it has ever been in the practice of medicine. Before blood is taken from a donor, a complete history is taken to make sure that person has no history of diseases nor engages in risky social behavior (examples are intravenous drug use or sexual activity with multiple partners). The donor's travel history is screened to minimize risk of transmitting infections, such as malaria. The donated blood is tested for signs of infectious diseases, such as HIV and hepatitis. The blood is then tested to be sure it is compatible with you in order to minimize the chance of a transfusion reaction. If you or a relative donates blood, this is often done in anticipation of surgery and is not appropriate for emergency situations. It  takes many days to process the donated blood. RISKS AND COMPLICATIONS Although transfusion therapy is very safe and saves many lives, the main dangers of transfusion include:   Getting an infectious disease.  Developing a transfusion reaction. This is an allergic reaction to something in the blood you were given. Every precaution is taken to prevent this. The decision to have a blood transfusion has been considered carefully by your caregiver before blood is given. Blood is not given unless the benefits outweigh the risks. AFTER THE TRANSFUSION  Right after receiving a blood transfusion, you will usually feel much better and more energetic. This is especially true if your red blood cells have gotten low (anemic). The transfusion raises the level of the red blood cells which carry oxygen, and this usually causes an energy increase.  The nurse administering the transfusion will monitor you carefully for complications. HOME CARE INSTRUCTIONS  No special instructions are needed after a transfusion. You [place] find your energy is better. Speak with your caregiver about any  limitations on activity for underlying diseases you [place] have. SEEK MEDICAL CARE IF:   Your condition is not improving after your transfusion.  You develop redness or irritation at the intravenous (IV) site. SEEK IMMEDIATE MEDICAL CARE IF:  Any of the following symptoms occur over the next 12 hours:  Shaking chills.  You have a temperature by mouth above 102 F (38.9 C), not controlled by medicine.  Chest, back, or muscle pain.  People around you feel you are not acting correctly or are confused.  Shortness of breath or difficulty breathing.  Dizziness and fainting.  You get a rash or develop hives.  You have a decrease in urine output.  Your urine turns a dark color or changes to pink, red, or brown. Any of the following symptoms occur over the next 10 days:  You have a temperature by mouth above 102 F (38.9 C), not controlled by medicine.  Shortness of breath.  Weakness after normal activity.  The white part of the eye turns yellow (jaundice).  You have a decrease in the amount of urine or are urinating less often.  Your urine turns a dark color or changes to pink, red, or brown. Document Released: 08/20/2000 Document Revised: 11/15/2011 Document Reviewed: 04/08/2008 [facility] Patient Information 2014 [place], [place].  _______________________________________________________________________

## 2018-01-04 NOTE — Progress Notes (Signed)
11-02-17 (Epic) Cardiac Clearance from Melina Copa, PAC  12-25-17 (Epic) EKG, CBC (WNL)  06-14-17 (ECHO) mild stenosis

## 2018-01-04 NOTE — Patient Instructions (Addendum)
-   your episode 10 days ago was concerning for mini-stroke. Due to your afib condition, will recommend to change coumadin to eliquis for better stroke prevention - please stop coumadin tonight to prepare your colonoscopy and abdominal surgery. Recommend once after your surgery and hemodynamically stable, start eliquis 5mg  twice a day for stroke prevention. No more coumadin.  - we have given you the eliquis discount card. Pick up the medication once ready to start.  - follow up with cardiology Dr. [name] - will do carotid doppler to evaluate carotid vessels.  - Follow up with your primary care physician for stroke risk factor modification. Recommend maintain blood pressure goal <130/80, diabetes with hemoglobin A1c goal below 7.0% and lipids with LDL cholesterol goal below 70 mg/dL.  - follow up in 3 months with Janett Billow.

## 2018-01-05 ENCOUNTER — Other Ambulatory Visit: Payer: Self-pay

## 2018-01-05 ENCOUNTER — Encounter (HOSPITAL_COMMUNITY): Payer: Self-pay

## 2018-01-05 NOTE — Progress Notes (Signed)
NEUROLOGY CLINIC NEW PATIENT NOTE  NAME: [name] DOB: 01-Jul-1935 REFERRING PHYSICIAN: Rennis Golden  I saw [name] as a new consult in the neurovascular clinic today regarding  Chief Complaint  Patient presents with  . New Patient (Initial Visit)    Room 1. Patient is alone, family waiting in lobby. Patient reports that she is doing well since the hospital.   .  HPI: [name] is a 83 y.o. female with PMH of HTN, HLD, CAD/NSTEMI, CHF, afib on coumadin, extensive hx of diverticulitis and adnexal mass who presents as a new patient for TIA.   She has had extensive hx of right lower quadrant pain. She has hx of hemicolectomy following with GI. 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 She is scheduled to have colonoscopy next Monday and then sigmoid colectomy next Tuesday. She was scheduled to stop coumadin 5 days prior to the surgery.   On 12/25/17, she was making biscuit. Suddenly, she was not feeling right, felt sick in stomach. She had to lie down. Strange feeling went away in 10-20 min. She then got up, but started to have N/V and ongoing for the next 2 hours. When she started walking at home, she felt she walked leaning to her side ways. The imbalance lasted about 4-5 hours and then resolved. Denies any double vision, speech difficulty, weakness or swallowing difficulty. Never had this happened before. CT head unremarkable. Pt was referred here for further evaluation.  She has hx of permanent afib, on coumadin at home. However, her INR fluctuate, on record, her last INR 10 days ago 1.9, 2 months ago 2.16, and last year all subtherapeutic. She follows with Dr. [name] in cardiology. BP today 152/65 in clinic.   Past Medical History:  Diagnosis Date  . Allergy    rhinitis  . Aortic stenosis    mild by echo 06/2017  . Arthritis     . Bradycardia    a. 10/2017 -> beta blocker cut back due to HR 39.  . Breast cancer ([place]) 01/06/2012  . Cancer [facility])    right colon and left breast  . Chronic diastolic CHF (congestive heart failure) ([place])   . Colon cancer ([place]) 01/06/2012  . COPD (chronic obstructive pulmonary disease) ([facility])    pt. denies  . Coronary artery disease 2006   a.  NSTEMI in 2016, cath showed 15% prox-mid RCA, 20% prox LAD, EF 25-35% by cath and 35-40% -> felt due to Takotsubo cardiomyopathy.  . Dilated aortic root ([place])    56mmHg by echo 06/2017  . Diverticulosis   . Dyspnea   . Edema extremities   . GERD (gastroesophageal reflux disease)   . Hernia   . Hiatal hernia    denies  . Hyperlipidemia   . Hypertension   . Mild aortic stenosis    echo 11/2015 but not noted on echo 06/2016  . Osteopenia   . Permanent atrial fibrillation ([facility])    chronic atrial fibrillation  . Pneumonia    hx child  . Pulmonary HTN ([place])    a. moderate to severe PASP 47mmHg echo 11/2015 - now 14mmHg by echo 06/2017. CTA chest in 11/16 with no PE. PFTs in 7/15 with mild obstructive lung disease. She had a negative sleep study in 2017. b. Felt due to left sided HF.  . Takotsubo  syndrome 07/29/2015   a. EF 35-40% by echo; akinesis of mid-apical anteroseptal and apical myocardium.  EF now normalized on echo 11/2015   Past Surgical History:  Procedure Laterality Date  . APPENDECTOMY    . BREAST SURGERY     lumpectomy left  . CARDIAC CATHETERIZATION    . CARDIAC CATHETERIZATION N/A 07/28/2015   Procedure: Left Heart Cath and Coronary Angiography;  Surgeon: Peter M Martinique, MD;  Location: [facility];  Service: Cardiovascular;  Laterality: N/A;  . CHOLECYSTECTOMY    . COLECTOMY     right side  . EXCISION OF ACCESSORY NIPPLE Bilateral 05/30/2013   Procedure: BILATERAL NIPPLE BIOPSY;  Surgeon: Merrie Roof, MD;  Location: [facility];  Service: General;  Laterality: Bilateral;  . EYE SURGERY Bilateral 14   cataracts  . IR  RADIOLOGIST EVAL & MGMT  07/26/2017  . SPLIT NIGHT STUDY  02/02/2016       Family History  Problem Relation Age of Onset  . Heart disease Mother   . Heart attack Mother   . Cancer Sister        stomach and colon  . Heart disease Brother 5  . Hypertension Father   . Cancer Sister   . Stroke Neg Hx    Current Outpatient Medications  Medication Sig Dispense Refill  . acetaminophen (TYLENOL) 500 MG tablet Take 500-1,000 mg by mouth every 6 (six) hours as needed for headache (pain).    Marland Kitchen amLODipine (NORVASC) 5 MG tablet TAKE ONE TABLET BY MOUTH EVERY DAY 30 tablet 3  . augmented betamethasone dipropionate (DIPROLENE-AF) 0.05 % ointment Apply 1 application topically 2 (two) times daily as needed (rash/dry skin).     . cetirizine (ZYRTEC) 10 MG tablet Take 10 mg by mouth daily as needed for allergies.    . clonazePAM (KLONOPIN) 0.5 MG tablet TAKE 1 TABLET BY MOUTH TWICE DAILY AS NEEDED FOR ANXIETY (Patient taking differently: TAKE 1 TABLET (0.5 MG) BY MOUTH TWICE DAILY AS NEEDED FOR ANXIETY) 60 tablet 2  . cloNIDine (CATAPRES) 0.1 MG tablet Take 1 tablet (0.1 mg total) by mouth 2 (two) times daily. 180 tablet 2  . Guaifenesin (MUCINEX MAXIMUM STRENGTH) 1200 MG TB12 Take 1,200 mg by mouth daily as needed (congestion).    Marland Kitchen HYDROcodone-acetaminophen (NORCO) 7.5-325 MG tablet Take 1 tablet by mouth every 6 (six) hours as needed for severe pain. 30 tablet 0  . losartan (COZAAR) 100 MG tablet TAKE ONE TABLET BY MOUTH EVERY DAY 30 tablet 3  . metoprolol succinate (TOPROL XL) 25 MG 24 hr tablet Take 1 tablet (25 mg total) by mouth daily. 30 tablet 1  . mirtazapine (REMERON) 15 MG tablet TAKE 1 TABLET BY MOUTH AT BEDTIME 90 tablet 0  . nitroGLYCERIN (NITROSTAT) 0.4 MG SL tablet PLACE ONE TABLET UNDER THE TONGUE EVERY FIVE MINUTES AS NEEDED FOR CHEST PAIN. [place] REPEAT FOR THREE DOSES. 30 tablet 2  . omeprazole (PRILOSEC OTC) 20 MG tablet Take 20 mg by mouth daily with supper.    . ondansetron (ZOFRAN  ODT) 4 MG disintegrating tablet Take 1 tablet (4 mg total) by mouth every 8 (eight) hours as needed for nausea or vomiting. 20 tablet 0  . OVER THE COUNTER MEDICATION Place 1 drop into both eyes daily as needed (dry eyes). Over the counter lubricating eye drops    . spironolactone (ALDACTONE) 25 MG tablet TAKE 1/2 (ONE-HALF) TABLET BY MOUTH ONCE DAILY 45 tablet 3  . torsemide (DEMADEX) 20 MG  tablet Take 60 mg by mouth daily.     Marland Kitchen tiZANidine (ZANAFLEX) 4 MG tablet Take 1 tablet (4 mg total) by mouth every 8 (eight) hours as needed for muscle spasms. (Patient not taking: Reported on 01/04/2018) 30 tablet 0   Current Facility-Administered Medications  Medication Dose Route Frequency Provider Last Rate Last Dose  . [START ON 01/18/2018] apixaban (ELIQUIS) tablet 5 mg  5 mg Oral BID Rosalin Hawking, MD       Allergies  Allergen Reactions  . Contrast Media [Iodinated Diagnostic Agents] Hives and Other (See Comments)    Per pt strong burning sensation starting in chest radiating outward   . Clonidine Derivatives Other (See Comments)    Throat dry  . Statins Other (See Comments)    "bones hurt"  . Sulfa Antibiotics Diarrhea    Tremors   . Celebrex [Celecoxib] Rash  . Isosorbide Nitrate Itching and Rash  . Other Itching and Rash    Plastic and paper tape and heart monitor pads  . Tape Itching and Rash    Red Where applied and will spread   Social History   Socioeconomic History  . Marital status: Married    Spouse name: Not on file  . Number of children: Not on file  . Years of education: Not on file  . Highest education level: Not on file  Occupational History  . Not on file  Social Needs  . Financial resource strain: Not on file  . Food insecurity:    Worry: Not on file    Inability: Not on file  . Transportation needs:    Medical: Not on file    Non-medical: Not on file  Tobacco Use  . Smoking status: Never Smoker  . Smokeless tobacco: Never Used  Substance and Sexual Activity    . Alcohol use: No  . Drug use: No  . Sexual activity: Not Currently  Lifestyle  . Physical activity:    Days per week: Not on file    Minutes per session: Not on file  . Stress: Not on file  Relationships  . Social connections:    Talks on phone: Not on file    Gets together: Not on file    Attends religious service: Not on file    Active member of club or organization: Not on file    Attends meetings of clubs or organizations: Not on file    Relationship status: Not on file  . Intimate partner violence:    Fear of current or ex partner: Not on file    Emotionally abused: Not on file    Physically abused: Not on file    Forced sexual activity: Not on file  Other Topics Concern  . Not on file  Social History Narrative  . Not on file    Review of Systems Full 14 system review of systems performed and notable only for those listed, all others are neg:  Constitutional:   Cardiovascular:  Ear/Nose/Throat:   Skin:  Eyes:   Respiratory:   Gastroitestinal:   Genitourinary:  Hematology/Lymphatic:   Endocrine:  Musculoskeletal:   Allergy/Immunology:   Neurological:   Psychiatric:  Sleep:    Physical Exam  Vitals:   01/04/18 1428  BP: (!) 152/65  Pulse: (!) 48    General - Well nourished, well developed, in no apparent distress.  Ophthalmologic - fundi not visualized due to noncooperation .  Cardiovascular - irregularly irregular heart rate and rhythm   Mental Status -  Level of arousal and orientation to time, place, and person were intact. Language including expression, naming, repetition, comprehension, reading, and writing was assessed and found intact. Fund of Knowledge was assessed and was intact.  Cranial Nerves II - XII - II - Visual field intact OU. III, IV, VI - Extraocular movements intact. V - Facial sensation intact bilaterally. VII - Facial movement intact bilaterally. VIII - Hearing & vestibular intact bilaterally. X - Palate elevates  symmetrically. XI - Chin turning & shoulder shrug intact bilaterally. XII - Tongue protrusion intact.  Motor Strength - The patient's strength was normal in all extremities and pronator drift was absent.  Bulk was normal and fasciculations were absent.   Motor Tone - Muscle tone was assessed at the neck and appendages and was normal.  Reflexes - The patient's reflexes were normal in all extremities and she had no pathological reflexes.  Sensory - Light touch, temperature/pinprick, vibration and proprioception, and Romberg testing were assessed and were normal.    Coordination - The patient had normal movements in the hands with no ataxia or dysmetria.  Tremor was absent.  Gait and Station - slight difficulty getting out of chair, slow cautions gait, small stride. Mild stooped posturing.     Imaging  I have personally reviewed the radiological images below and agree with the radiology interpretations.  Ct Head Wo Contrast  Result Date: 12/25/2017 CLINICAL DATA:  Dizziness, vomiting EXAM: CT HEAD WITHOUT CONTRAST TECHNIQUE: Contiguous axial images were obtained from the base of the skull through the vertex without intravenous contrast. COMPARISON:  None. FINDINGS: Brain: There is atrophy and chronic small vessel disease changes. No acute intracranial abnormality. Specifically, no hemorrhage, hydrocephalus, mass lesion, acute infarction, or significant intracranial injury. Vascular: No hyperdense vessel or unexpected calcification. Skull: No acute calvarial abnormality. Sinuses/Orbits: Visualized paranasal sinuses and mastoids clear. Orbital soft tissues unremarkable. Other: None IMPRESSION: No acute intracranial abnormality. Atrophy, chronic microvascular disease. Electronically Signed   By: Rolm Baptise M.D.   On: 12/25/2017 21:40   Lab Review Component     Latest Ref Rng & Units 01/16/2016 03/15/2016 07/08/2016 11/29/2016  Cholesterol     0 - 200 mg/dL 163 170    Triglycerides     0 - 149  mg/dL 88 113  84  HDL Cholesterol     >39 mg/dL 53 48  59  Total CHOL/HDL Ratio     0.0 - 4.4 ratio units 3.1 3.5  2.7  VLDL     0 - 40 mg/dL 18 23    LDL (calc)     0 - 99 mg/dL 92 99  81  Cholesterol, Total     100 - 199 mg/dL    157  VLDL Cholesterol Cal     5 - 40 mg/dL    17  TSH     mIU/L   2.20      Assessment and Plan:   In summary, [name] is a 83 y.o. female with PMH of HTN, HLD, CAD/NSTEMI, CHF, afib on coumadin, extensive hx of diverticulitis and adnexal mass who presents as a new patient for TIA with N/V and gait instability. Due to hx of Afib on coumadin with subtherapeutic INR, concerning for TIA due to inadequate anticoagulation. Will recommend to switch to eliquis. However, pt is scheduled to have colonoscopy next Monday and then sigmoid colectomy next Tuesday, and she needs to stop coumadin 5 days prior to the surgery as instructed. Will initiate eliquis after her surgery  once hemodynamically stable. Will do CUS to finish stroke work up. Do not think MRI necessary since not change treatment plan.  - will recommend to change coumadin to eliquis for better stroke prevention - please stop coumadin tonight to prepare your colonoscopy and abdominal surgery. Recommend once after your surgery and hemodynamically stable, start eliquis 5mg  twice a day for stroke prevention. No more coumadin.  - we have given you the eliquis discount card. Pick up the medication once ready to start.  - follow up with cardiology Dr. [name] - will do carotid doppler to evaluate carotid vessels.  - Follow up with your primary care physician for stroke risk factor modification. Recommend maintain blood pressure goal <130/80, diabetes with hemoglobin A1c goal below 7.0% and lipids with LDL cholesterol goal below 70 mg/dL.  - follow up in 3 months with Janett Billow.  I recommend aggressive blood pressure control with a goal <130/80 mm Hg.  Lipids should be managed intensively, with a goal LDL < 70  mg/dL.  I encouraged the patient to discuss these important issues with her primary care physician.  I counseled the patient on measures to reduce stroke risk, including the importance of medication compliance, risk factor control, exercise, healthy diet, and avoidance of smoking.  I reviewed stroke warning signs and symptoms and appropriate actions to take if such occurs.   Thank you very much for the opportunity to participate in the care of this patient.  Please do not hesitate to call if any questions or concerns arise.  No orders of the defined types were placed in this encounter.   Meds ordered this encounter  Medications  . apixaban (ELIQUIS) tablet 5 mg    Start after her incoming abdominal surgery. No more coumadin. Thanks.    Patient Instructions  - your episode 10 days ago was concerning for mini-stroke. Due to your afib condition, will recommend to change coumadin to eliquis for better stroke prevention - please stop coumadin tonight to prepare your colonoscopy and abdominal surgery. Recommend once after your surgery and hemodynamically stable, start eliquis 5mg  twice a day for stroke prevention. No more coumadin.  - we have given you the eliquis discount card. Pick up the medication once ready to start.  - follow up with cardiology Dr. [name] - will do carotid doppler to evaluate carotid vessels.  - Follow up with your primary care physician for stroke risk factor modification. Recommend maintain blood pressure goal <130/80, diabetes with hemoglobin A1c goal below 7.0% and lipids with LDL cholesterol goal below 70 mg/dL.  - follow up in 3 months with Janett Billow.    Rosalin Hawking, MD PhD [facility] [address] [place], [place] [address] [phone]

## 2018-01-06 ENCOUNTER — Other Ambulatory Visit: Payer: Self-pay

## 2018-01-06 ENCOUNTER — Telehealth: Payer: Self-pay | Admitting: Cardiology

## 2018-01-06 ENCOUNTER — Encounter (HOSPITAL_COMMUNITY)
Admission: RE | Admit: 2018-01-06 | Discharge: 2018-01-06 | Disposition: A | Discharge status: Home / Self Care | Payer: Medicare Other | Source: Ambulatory Visit | Attending: General Surgery | Admitting: General Surgery

## 2018-01-06 ENCOUNTER — Encounter (HOSPITAL_COMMUNITY): Payer: Self-pay

## 2018-01-06 DIAGNOSIS — G459 Transient cerebral ischemic attack, unspecified: Secondary | ICD-10-CM | POA: Insufficient documentation

## 2018-01-06 DIAGNOSIS — Z01812 Encounter for preprocedural laboratory examination: Secondary | ICD-10-CM | POA: Insufficient documentation

## 2018-01-06 LAB — CBC
HEMATOCRIT: 38.7 % (ref 36.0–46.0)
HEMOGLOBIN: 12.7 g/dL (ref 12.0–15.0)
MCH: 29.7 pg (ref 26.0–34.0)
MCHC: 32.8 g/dL (ref 30.0–36.0)
MCV: 90.4 fL (ref 78.0–100.0)
Platelets: 275 10*3/uL (ref 150–400)
RBC: 4.28 MIL/uL (ref 3.87–5.11)
RDW: 14.4 % (ref 11.5–15.5)
WBC: 7.3 10*3/uL (ref 4.0–10.5)

## 2018-01-06 LAB — BASIC METABOLIC PANEL
ANION GAP: 10 (ref 5–15)
BUN: 35 mg/dL — ABNORMAL HIGH (ref 6–20)
CALCIUM: 9.8 mg/dL (ref 8.9–10.3)
CO2: 27 mmol/L (ref 22–32)
Chloride: 97 mmol/L — ABNORMAL LOW (ref 101–111)
Creatinine, Ser: 1.14 mg/dL — ABNORMAL HIGH (ref 0.44–1.00)
GFR calc Af Amer: 51 mL/min — ABNORMAL LOW (ref >60)
GFR, EST NON AFRICAN AMERICAN: 44 mL/min — AB (ref >60)
Glucose, Bld: 105 mg/dL — ABNORMAL HIGH (ref 65–99)
POTASSIUM: 4.7 mmol/L (ref 3.5–5.1)
SODIUM: 134 mmol/L — AB (ref 135–145)

## 2018-01-06 LAB — ABO/RH: ABO/RH(D): O POS

## 2018-01-06 NOTE — Telephone Encounter (Signed)
Pt was cleared on 10/2017 and instructions to hold coumadin 5 days before depending on surgeon. She is able to meet 5 METS.  No changes since 10/3017 when I talked to pt today.  She stopped coumadin with last dose 01/03/18.       Primary Cardiologist: Fransico Him, MD  Chart reviewed as part of pre-operative protocol coverage. Given past medical history and time since last visit, based on ACC/AHA guidelines, Kailah Pennel Giovanni would be at acceptable risk for the planned procedure without further cardiovascular testing.   I will route this recommendation to the requesting party via Epic fax function and remove from pre-op pool.  Please call with questions.  Cecilie Kicks, NP 01/06/2018, 1:57 PM

## 2018-01-06 NOTE — Telephone Encounter (Signed)
New message         [facility] Pre-operative Risk Assessment    Request for surgical clearance:  1. What type of surgery is being performed? Pre op Colonoscopy/ sigmoid colectomy  2. When is this surgery scheduled? 01/09/2018 and 01/10/2018  3. What type of clearance is required (medical clearance vs. Pharmacy clearance to hold med vs. Both)? Both  4. Are there any medications that need to be held prior to surgery and how long?  5. Practice name and name of physician performing surgery? [facility] - Dr Marlou Starks  6. What is your office phone number [phone]    7.   What is your office fax number [phone]  8.   Anesthesia type (None, local, MAC, general) ? general   [name] 01/06/2018, 1:21 PM  _________________________________________________________________   (provider comments below)

## 2018-01-06 NOTE — Progress Notes (Addendum)
Attempted to contact [facility] x 2, and [facility] x 1 to have pt marked.  No return call. Patient will have to be marked day of surgery.   [facility] Nurse returned page. They are prepared to mark pt at Surgery appt. [facility] nurse aware that pt will be admitted on 01-09-18 after colonscopy for her scheduled Colectomy on 01-10-18.

## 2018-01-06 NOTE — Progress Notes (Signed)
Spoke with kelly lpn at dr Dalbert Batman office patient needs updated cardiac clearance due to 01-04-18 tia from [facility] [place] patient sees per dr [facility] anesthesia.Marland Kitchen Kelly lpn to make dr Dalbert Batman  aware patient came for pre op and labs 01-06-18 and consent for surgery and needs up dated cardiac clearance.

## 2018-01-06 NOTE — Progress Notes (Signed)
01-06-18 Updated cardiac clearance in chart in Telephone Encounter.

## 2018-01-09 ENCOUNTER — Ambulatory Visit (HOSPITAL_COMMUNITY)
Admission: RE | Admit: 2018-01-09 | Discharge: 2018-01-09 | Disposition: A | Discharge status: Home / Self Care | Payer: Medicare Other | Source: Ambulatory Visit | Attending: Gastroenterology | Admitting: Gastroenterology

## 2018-01-09 ENCOUNTER — Encounter (HOSPITAL_COMMUNITY): Payer: Self-pay

## 2018-01-09 ENCOUNTER — Encounter (HOSPITAL_COMMUNITY)
Admission: RE | Disposition: A | Discharge status: Home / Self Care | Payer: Self-pay | Source: Ambulatory Visit | Attending: Gastroenterology

## 2018-01-09 ENCOUNTER — Telehealth: Payer: Self-pay | Admitting: Neurology

## 2018-01-09 ENCOUNTER — Encounter (HOSPITAL_COMMUNITY): Payer: Self-pay | Admitting: Anesthesiology

## 2018-01-09 DIAGNOSIS — Z538 Procedure and treatment not carried out for other reasons: Secondary | ICD-10-CM | POA: Insufficient documentation

## 2018-01-09 HISTORY — DX: Dyspnea, unspecified: R06.00

## 2018-01-09 SURGERY — CANCELLED PROCEDURE

## 2018-01-09 MED ORDER — PROPOFOL 10 MG/ML IV BOLUS
INTRAVENOUS | Status: AC
  Filled 2018-01-09: qty 40

## 2018-01-09 MED ORDER — SODIUM CHLORIDE 0.9 % IV SOLN: INTRAVENOUS | Status: DC

## 2018-01-09 MED ORDER — APIXABAN 5 MG PO TABS: 5.0000 mg | ORAL_TABLET | Freq: Two times a day (BID) | ORAL | 3 refills | Status: DC

## 2018-01-09 SURGICAL SUPPLY — 22 items

## 2018-01-09 NOTE — Addendum Note (Signed)
Addended by: Rosalin Hawking on: 01/09/2018 05:43 PM   Modules accepted: Orders

## 2018-01-09 NOTE — Telephone Encounter (Signed)
Pt is asking for a call as to what blood thinner she should be on.  Pt is aware that Dr Erlinda Hong is not here in the office this week, but that RN will reach out to him and have her contacted from there is response to her concern about her blood thinner medication.  Please call

## 2018-01-09 NOTE — Telephone Encounter (Signed)
Rn call patients son phone on dpr. Pts phone was call three times but it had a busy signal.Rn stated per her AVS she is stop coumadin prior for colonoscopy procedure,and surgery, and start the eliquis when safe after the procedure. PT stated the eliquis was not at the pharmacy. Rn stated its confirmed the eliquis was sent to start 01/18/2018. PT stated the procedure was cancel,and it has not be reschedule. Rn stated a message will be sent to Dr. Erlinda Hong. Rn advised pt to continue taking the coumadin until Dr. Erlinda Hong see this phone note. PT verbalized understanding.

## 2018-01-09 NOTE — Progress Notes (Signed)
Late entry: Dr. Marlou Starks came to speak with patient in the Endoscopy admitting area at approximately 1040 this morning. I heard him say that the patient would be rescheduled for her colonoscopy and surgery due to more investigation of a TIA event.

## 2018-01-09 NOTE — Telephone Encounter (Signed)
Discussed with patient over the phone.  She stated that she went to see her surgeon, and her surgeon wanted her to finish ulcer work-up before the surgery.  Therefore, her surgery was postponed.  She was awaiting for carotid Doppler to be done.  She had her last INR check 3 weeks ago, which was 3.0.  Her last Coumadin dose was on 01/03/2018.  I feel her INR at this time most likely lower than 2.0.  Should be safe to start 5 mg Eliquis twice daily.  I have already given her discount card last visit.  I ordered adequate to her pharmacy. She will pick up tomorrow to start.  I told her to make to be called for her carotid Doppler and then call her surgeon for reschedule surgery.  She expressed understanding and appreciation.  Rosalin Hawking, MD PhD Stroke Neurology 01/09/2018 5:43 PM  Meds ordered this encounter  Medications  . apixaban (ELIQUIS) 5 MG TABS tablet    Sig: Take 1 tablet (5 mg total) by mouth 2 (two) times daily.    Dispense:  60 tablet    Refill:  3

## 2018-01-09 NOTE — Consult Note (Signed)
[facility] Nurse requested for preoperative stoma site marking  Discussed surgical procedure and stoma creation with patient and family.  Explained role of the [facility] nurse team.  Provided the patient with educational booklet and provided samples of pouching options.  Answered patient and family questions.   Examined patient lying, sitting, and standing in order to place the marking in the patient's visual field, away from any creases or abdominal contour issues and within the rectus muscle.  Attempted to mark below the patient's belt line.   Marked for colostomy in the LUQ  7 cm to the left of the umbilicus and 4 cm above the umbilicus.  I also marked the RUQ 7 cm to the right of the umbilicus and 4 cm above the umbilicus.  Patient's abdomen cleansed with CHG wipes at site markings, allowed to air dry prior to marking.Covered mark with thin film transparent dressing to preserve mark until date of surgery.   [facility] Nurse team will follow up with patient after surgery for continue ostomy care and teaching.  Val Riles, RN, MSN, CWOCN, CNS-BC, pager [phone]

## 2018-01-10 ENCOUNTER — Inpatient Hospital Stay (HOSPITAL_COMMUNITY): Admission: RE | Admit: 2018-01-10 | Payer: Medicare Other | Source: Ambulatory Visit | Admitting: General Surgery

## 2018-01-10 LAB — TYPE AND SCREEN
ABO/RH(D): O POS
ANTIBODY SCREEN: NEGATIVE

## 2018-01-10 SURGERY — COLECTOMY, SIGMOID, LAPAROSCOPIC
Anesthesia: General

## 2018-01-11 ENCOUNTER — Encounter (HOSPITAL_COMMUNITY): Payer: Medicare Other

## 2018-01-12 NOTE — Telephone Encounter (Signed)
Doppler scheduled.

## 2018-01-17 ENCOUNTER — Encounter: Payer: Self-pay | Admitting: Cardiology

## 2018-01-26 ENCOUNTER — Other Ambulatory Visit: Payer: Self-pay

## 2018-01-26 MED ORDER — METOPROLOL SUCCINATE ER 25 MG PO TB24: 25.0000 mg | ORAL_TABLET | Freq: Every day | ORAL | 11 refills | Status: DC

## 2018-02-01 ENCOUNTER — Ambulatory Visit: Payer: Medicare Other | Admitting: Neurology

## 2018-02-01 DIAGNOSIS — K5732 Diverticulitis of large intestine without perforation or abscess without bleeding: Secondary | ICD-10-CM | POA: Diagnosis not present

## 2018-02-03 ENCOUNTER — Ambulatory Visit (HOSPITAL_COMMUNITY)
Admission: RE | Admit: 2018-02-03 | Discharge: 2018-02-03 | Disposition: A | Discharge status: Home / Self Care | Payer: Medicare Other | Source: Ambulatory Visit | Attending: Neurology | Admitting: Neurology

## 2018-02-03 DIAGNOSIS — G459 Transient cerebral ischemic attack, unspecified: Secondary | ICD-10-CM | POA: Insufficient documentation

## 2018-02-06 ENCOUNTER — Ambulatory Visit: Payer: Medicare Other | Admitting: Cardiology

## 2018-02-06 ENCOUNTER — Encounter: Payer: Self-pay | Admitting: Cardiology

## 2018-02-06 ENCOUNTER — Telehealth: Payer: Self-pay | Admitting: *Deleted

## 2018-02-06 VITALS — BP 152/54 | HR 59 | Ht 66.0 in | Wt 200.0 lb

## 2018-02-06 DIAGNOSIS — I272 Pulmonary hypertension, unspecified: Secondary | ICD-10-CM | POA: Diagnosis not present

## 2018-02-06 DIAGNOSIS — I482 Chronic atrial fibrillation: Secondary | ICD-10-CM | POA: Diagnosis not present

## 2018-02-06 DIAGNOSIS — I350 Nonrheumatic aortic (valve) stenosis: Secondary | ICD-10-CM | POA: Diagnosis not present

## 2018-02-06 DIAGNOSIS — I10 Essential (primary) hypertension: Secondary | ICD-10-CM | POA: Diagnosis not present

## 2018-02-06 DIAGNOSIS — I4821 Permanent atrial fibrillation: Secondary | ICD-10-CM

## 2018-02-06 DIAGNOSIS — E785 Hyperlipidemia, unspecified: Secondary | ICD-10-CM

## 2018-02-06 DIAGNOSIS — I77810 Thoracic aortic ectasia: Secondary | ICD-10-CM

## 2018-02-06 DIAGNOSIS — I5032 Chronic diastolic (congestive) heart failure: Secondary | ICD-10-CM | POA: Diagnosis not present

## 2018-02-06 DIAGNOSIS — I2510 Atherosclerotic heart disease of native coronary artery without angina pectoris: Secondary | ICD-10-CM

## 2018-02-06 DIAGNOSIS — R079 Chest pain, unspecified: Secondary | ICD-10-CM

## 2018-02-06 NOTE — Patient Instructions (Signed)
Medication Instructions:  Your physician recommends that you continue on your current medications as directed. Please refer to the Current Medication list given to you today.  If you need a refill on your cardiac medications, please contact your pharmacy first.  Labwork: Your physician recommends that you return for lab work in 1-2 weeks when your return for your stress test   Testing/Procedures: Your physician has requested that you have a lexiscan myoview. For further information please visit [email or web address]. Please follow instruction sheet, as given.   Follow-Up: Your physician wants you to follow-up in: 6 months with Dr. [name]. You will receive a reminder letter in the mail two months in advance. If you don't receive a letter, please call our office to schedule the follow-up appointment.  Any Other Special Instructions Will Be Listed Below (If Applicable).   Thank you for choosing [facility], RN  [phone]  If you need a refill on your cardiac medications before your next appointment, please call your pharmacy.

## 2018-02-06 NOTE — Telephone Encounter (Signed)
Patient called and stated that "I'm not having surgery with Dt. Marlou Starks now. He would like for me to follow up with Dr. Denman George." Appt made to see Dr. Denman George on 6/28

## 2018-02-06 NOTE — Progress Notes (Signed)
Cardiology Office Note:    Date:  02/06/2018   ID:  [name], DOB 01-09-1935, MRN [id number]  PCP:  Orlena Sheldon, PA-C  Cardiologist:  Fransico Him, MD    Referring MD: [name], MD   Chief Complaint  Patient presents with  . Coronary Artery Disease  . Hypertension  . Hyperlipidemia  . Atrial Fibrillation  . Aortic Stenosis    History of Present Illness:    Kalanie Fewell Pruss is a 83 y.o. female with a hx of  CAD, HTN, chronic diastolic CHF NYHA class II-III, prior history of Takotsubo DCM (EF 35-40% 07/2015 and repeat echo 3/17 with normalization of LVF), dyslipidemia, moderatepulmonary HTN with chronic DOE secondary to pulmonary venous HTN from left sided HF followed in AHF clinic,mild AS, mildly dilated aortic root and permanent AF. She was seen by my PA on 10/25/2017 for preoperative clearance for colon surgery and complained of 2 episodes of right-sided chest pain with radiation to her right arm.  She was in atrial fibrillation with a heart rate of 39 on exam.  Her cath in 2016 showed minimal CAD.  Her Toprol was decreased to 25 mg and it was felt that possibly the bradycardia had caused her chest pain.  She was seen back in follow-up by Sharrell Ku, PA and her heart rate improved to the 50s and 60s and was completely asymptomatic with no further chest pain was cleared for surgery.  She is here today for followup and is doing well.  She denies any chest pain or pressure, SOB, DOE, PND, orthopnea, LE edema, dizziness, palpitations or syncope. She is compliant with her meds and is tolerating meds with no SE.      Past Medical History:  Diagnosis Date  . Allergy    rhinitis  . Aortic stenosis    mild by echo 06/2017  . Arthritis   . Bradycardia    a. 10/2017 -> beta blocker cut back due to HR 39.  . Breast cancer ([place]) 01/06/2012  . Cancer [facility])    right colon and left breast  . Chronic diastolic CHF (congestive heart failure) ([place])   . Colon cancer ([place]) 01/06/2012  . COPD  (chronic obstructive pulmonary disease) ([place])    pt. denies  . Coronary artery disease 2006   a.  NSTEMI in 2016, cath showed 15% prox-mid RCA, 20% prox LAD, EF 25-35% by cath and 35-40% -> felt due to Takotsubo cardiomyopathy.  . Dilated aortic root ([place])    82mmHg by echo 06/2017  . Diverticulosis   . Dyspnea   . Edema extremities   . GERD (gastroesophageal reflux disease)   . Hernia   . Hiatal hernia    denies  . Hyperlipidemia   . Hypertension   . Mild aortic stenosis    echo 11/2015 but not noted on echo 06/2016  . Osteopenia   . Permanent atrial fibrillation ([facility])    chronic atrial fibrillation  . Pneumonia    hx child  . Pulmonary HTN ([place])    a. moderate to severe PASP 2mmHg echo 11/2015 - now 42mmHg by echo 06/2017. CTA chest in 11/16 with no PE. PFTs in 7/15 with mild obstructive lung disease. She had a negative sleep study in 2017. b. Felt due to left sided HF.  . Takotsubo syndrome 07/29/2015   a. EF 35-40% by echo; akinesis of mid-apical anteroseptal and apical myocardium.  EF now normalized on echo 11/2015    Past Surgical History:  Procedure Laterality Date  . APPENDECTOMY    . BREAST SURGERY     lumpectomy left  . CARDIAC CATHETERIZATION    . CARDIAC CATHETERIZATION N/A 07/28/2015   Procedure: Left Heart Cath and Coronary Angiography;  Surgeon: Peter M Martinique, MD;  Location: [facility];  Service: Cardiovascular;  Laterality: N/A;  . CHOLECYSTECTOMY    . COLECTOMY     right side  . EXCISION OF ACCESSORY NIPPLE Bilateral 05/30/2013   Procedure: BILATERAL NIPPLE BIOPSY;  Surgeon: Merrie Roof, MD;  Location: [facility];  Service: General;  Laterality: Bilateral;  . EYE SURGERY Bilateral 12   cataracts  . IR RADIOLOGIST EVAL & MGMT  07/26/2017  . SPLIT NIGHT STUDY  02/02/2016        Current Medications: Current Meds  Medication Sig  . acetaminophen (TYLENOL) 500 MG tablet Take 500-1,000 mg by mouth every 6 (six) hours as needed for headache (pain).  Marland Kitchen  amLODipine (NORVASC) 5 MG tablet TAKE ONE TABLET BY MOUTH EVERY DAY  . apixaban (ELIQUIS) 5 MG TABS tablet Take 1 tablet (5 mg total) by mouth 2 (two) times daily.  Marland Kitchen augmented betamethasone dipropionate (DIPROLENE-AF) 0.05 % ointment Apply 1 application topically 2 (two) times daily as needed (rash/dry skin).   . cetirizine (ZYRTEC) 10 MG tablet Take 10 mg by mouth daily as needed for allergies.  . clonazePAM (KLONOPIN) 0.5 MG tablet TAKE 1 TABLET BY MOUTH TWICE DAILY AS NEEDED FOR ANXIETY (Patient taking differently: TAKE 1 TABLET (0.5 MG) BY MOUTH TWICE DAILY AS NEEDED FOR ANXIETY)  . cloNIDine (CATAPRES) 0.1 MG tablet Take 1 tablet (0.1 mg total) by mouth 2 (two) times daily.  . Guaifenesin (MUCINEX MAXIMUM STRENGTH) 1200 MG TB12 Take 1,200 mg by mouth daily as needed (congestion).  Marland Kitchen HYDROcodone-acetaminophen (NORCO) 7.5-325 MG tablet Take 1 tablet by mouth every 6 (six) hours as needed for severe pain.  Marland Kitchen losartan (COZAAR) 100 MG tablet TAKE ONE TABLET BY MOUTH EVERY DAY  . metoprolol succinate (TOPROL XL) 25 MG 24 hr tablet Take 1 tablet (25 mg total) by mouth daily.  . mirtazapine (REMERON) 15 MG tablet TAKE 1 TABLET BY MOUTH AT BEDTIME  . nitroGLYCERIN (NITROSTAT) 0.4 MG SL tablet PLACE ONE TABLET UNDER THE TONGUE EVERY FIVE MINUTES AS NEEDED FOR CHEST PAIN. [place] REPEAT FOR THREE DOSES.  Marland Kitchen omeprazole (PRILOSEC OTC) 20 MG tablet Take 20 mg by mouth daily with supper.  . ondansetron (ZOFRAN ODT) 4 MG disintegrating tablet Take 1 tablet (4 mg total) by mouth every 8 (eight) hours as needed for nausea or vomiting.  Marland Kitchen OVER THE COUNTER MEDICATION Place 1 drop into both eyes daily as needed (dry eyes). Over the counter lubricating eye drops  . spironolactone (ALDACTONE) 25 MG tablet TAKE 1/2 (ONE-HALF) TABLET BY MOUTH ONCE DAILY  . tiZANidine (ZANAFLEX) 4 MG tablet Take 1 tablet (4 mg total) by mouth every 8 (eight) hours as needed for muscle spasms.  Marland Kitchen torsemide (DEMADEX) 20 MG tablet Take 60 mg  by mouth daily.      Allergies:   Contrast media [iodinated diagnostic agents]; Clonidine derivatives; Statins; Sulfa antibiotics; Celebrex [celecoxib]; Isosorbide nitrate; Other; and Tape   Social History   Socioeconomic History  . Marital status: Married    Spouse name: Not on file  . Number of children: Not on file  . Years of education: Not on file  . Highest education level: Not on file  Occupational History  . Not on file  Social Needs  . Financial resource strain: Not on file  . Food insecurity:    Worry: Not on file    Inability: Not on file  . Transportation needs:    Medical: Not on file    Non-medical: Not on file  Tobacco Use  . Smoking status: Never Smoker  . Smokeless tobacco: Never Used  Substance and Sexual Activity  . Alcohol use: No  . Drug use: No  . Sexual activity: Not Currently  Lifestyle  . Physical activity:    Days per week: Not on file    Minutes per session: Not on file  . Stress: Not on file  Relationships  . Social connections:    Talks on phone: Not on file    Gets together: Not on file    Attends religious service: Not on file    Active member of club or organization: Not on file    Attends meetings of clubs or organizations: Not on file    Relationship status: Not on file  Other Topics Concern  . Not on file  Social History Narrative  . Not on file     Family History: The patient's family history includes Cancer in her sister and sister; Heart attack in her mother; Heart disease in her mother; Heart disease (age of onset: 59) in her brother; Hypertension in her father. There is no history of Stroke.  ROS:   Please see the history of present illness.    ROS  All other systems reviewed and negative.   EKGs/Labs/Other Studies Reviewed:    The following studies were reviewed today: none  EKG:  EKG is not ordered today.    Recent Labs: 07/07/2017: B Natriuretic Peptide 128.3 12/13/2017: ALT 9 01/06/2018: BUN 35; Creatinine, Ser  1.14; Hemoglobin 12.7; Platelets 275; Potassium 4.7; Sodium 134   Recent Lipid Panel    Component Value Date/Time   CHOL 157 11/29/2016 1107   TRIG 84 11/29/2016 1107   HDL 59 11/29/2016 1107   CHOLHDL 2.7 11/29/2016 1107   CHOLHDL 3.5 03/15/2016 1057   VLDL 23 03/15/2016 1057   LDLCALC 81 11/29/2016 1107    Physical Exam:    VS:  BP (!) 152/54   Pulse (!) 59   Ht 5\' 6"  (1.676 m)   Wt 200 lb (90.7 kg)   SpO2 97%   BMI 32.28 kg/m     Wt Readings from Last 3 Encounters:  02/06/18 200 lb (90.7 kg)  01/09/18 198 lb (89.8 kg)  01/06/18 196 lb 4 oz (89 kg)     GEN:  Well nourished, well developed in no acute distress HEENT: Normal NECK: No JVD; No carotid bruits LYMPHATICS: No lymphadenopathy CARDIAC: RRR, no rubs, gallops.  2/6 SM at RUSB RESPIRATORY:  Clear to auscultation without rales, wheezing or rhonchi  ABDOMEN: Soft, non-tender, non-distended MUSCULOSKELETAL:  No edema; No deformity  SKIN: Warm and dry NEUROLOGIC:  Alert and oriented x 3 PSYCHIATRIC:  Normal affect   ASSESSMENT:    1. Coronary artery disease involving native coronary artery of native heart without angina pectoris   2. Permanent atrial fibrillation ([place])   3. Essential hypertension   4. Dilated aortic root ([place])   5. Pulmonary HTN ([place])   6. Chronic diastolic CHF (congestive heart failure) ([place])   7. Nonrheumatic aortic valve stenosis   8. Dyslipidemia, goal LDL below 70   9. Chest pain, unspecified type    PLAN:    In order of problems listed above:  1.  ASCAD - s/p NSTEMI with cath in 2016 showing 20% proximal LAD and 15% proximal to mid RCA with EF 25 to 30% c/w Takotsubo CM.  She has not had any further atypical right-sided chest pain but has had several episodes of left-sided chest pressure.  I am going to get a nuclear stress test to rule out ischemia.Marland Kitchen  She will continue on Toprol.  She is statin intolerant.  2.  Permanent atrial fibrillation -he is well rate controlled on exam  today.  She will continue on Eliquis 5 mg twice daily, Toprol XL 25 mg daily.  Hemoglobin was 12.7 and creatinine 1.14 on 01/06/2018.  3.  Hypertension -BP is borderline controlled on exam today.  She will continue on Toprol XL 25 mg daily, clonidine 0.1 mg twice daily, spironolactone 25 mg daily, losartan 100 mg daily and amlodipine 5 mg daily.  Creatinine was stable on lab drawl 01/2018.  4.  Dilated aortic root -echo 06/2017 showed a sending aortic dimension 39 mm.  I will repeat a 2D echocardiogram in 06/2018 to make sure this is stable.  5.  Pulmonary hypertension -most likely related to pulmonary venous hypertension from left-sided diastolic heart failure.  PA systolic pressure was 41 mmHg on echo 06/2017.  This will be rechecked at the time of her echo this fall.  Continue diuretics.  6.  Chronic diastolic heart failure -she appears euvolemic on exam today and weight is stable.  She will continue on spironolactone 12.5 mg daily.  Her at her last heart failure clinic office visit she was switched from Lasix to Demadex 60 mg daily which appears to be working well.  Her creatinine was stable at 1.14 on 01/06/2018.  7.  Aortic stenosis -echo 06/2017 showed very mild aortic stenosis with a mean aortic valve gradient 11 mmHg.  8.  Hyperlipidemia with LDL goal less than 70.  Her LDL was 81-year ago.  I will recheck an FLP and ALT.  She is statin and Zetia intolerant   Medication Adjustments/Labs and Tests Ordered: Current medicines are reviewed at length with the patient today.  Concerns regarding medicines are outlined above.  Orders Placed This Encounter  Procedures  . Lipid panel  . Hepatic function panel  . MYOCARDIAL PERFUSION IMAGING   No orders of the defined types were placed in this encounter.   Signed, Fransico Him, MD  02/06/2018 2:18 PM    [place]

## 2018-02-08 ENCOUNTER — Telehealth: Payer: Self-pay

## 2018-02-08 NOTE — Telephone Encounter (Signed)
Notes recorded by Marval Regal, RN on 02/08/2018 at 5:23 PM EDT Left vm for patient to call back during business hours to get vas carotid results. ------

## 2018-02-08 NOTE — Telephone Encounter (Signed)
-----   Message from Rosalin Hawking, MD sent at 02/05/2018  4:28 PM EDT ----- Could you please let the patient know that the carotid artery ultrasound test done recently was negative for artery stenosis. Please continue current treatment. She can now talk to her surgeon to schedule surgery. It is OK from neurology standpoint to stop eliquis 48 hours before the surgery and resume after the surgery if no continued bleeding. Thanks.  Rosalin Hawking, MD PhD Stroke Neurology 02/05/2018 4:28 PM

## 2018-02-09 ENCOUNTER — Telehealth (HOSPITAL_COMMUNITY): Payer: Self-pay | Admitting: *Deleted

## 2018-02-09 NOTE — Telephone Encounter (Signed)
Notes recorded by Marval Regal, RN on 02/09/2018 at 12:04 PM EDT RN call patient that her carotid ultrasound was negative for artery stenosis. Continue treatment plan. RN stated she can have her GI surgery and stop eliquis 48 hours prior. PT stated she will not be having surgery now. She will only have it if she has a flare up of her GI issue. Pt verbalized understanding. ------

## 2018-02-09 NOTE — Telephone Encounter (Signed)
-----   Message from Rosalin Hawking, MD sent at 02/05/2018  4:28 PM EDT ----- Could you please let the patient know that the carotid artery ultrasound test done recently was negative for artery stenosis. Please continue current treatment. She can now talk to her surgeon to schedule surgery. It is OK from neurology standpoint to stop eliquis 48 hours before the surgery and resume after the surgery if no continued bleeding. Thanks.  Rosalin Hawking, MD PhD Stroke Neurology 02/05/2018 4:28 PM

## 2018-02-09 NOTE — Telephone Encounter (Signed)
Patient given detailed instructions per Myocardial Perfusion Study Information Sheet for the test on 02/14/18 Patient notified to arrive 15 minutes early and that it is imperative to arrive on time for appointment to keep from having the test rescheduled.  If you need to cancel or reschedule your appointment, please call the office within 24 hours of your appointment. . Patient verbalized understanding. Breyer Tejera Jacqueline, RN    

## 2018-02-09 NOTE — Telephone Encounter (Signed)
Pt returning RN's call.

## 2018-02-14 ENCOUNTER — Ambulatory Visit (HOSPITAL_COMMUNITY): Payer: Medicare Other | Attending: Cardiovascular Disease

## 2018-02-14 ENCOUNTER — Other Ambulatory Visit: Payer: Medicare Other

## 2018-02-14 DIAGNOSIS — I4891 Unspecified atrial fibrillation: Secondary | ICD-10-CM | POA: Diagnosis not present

## 2018-02-14 DIAGNOSIS — I10 Essential (primary) hypertension: Secondary | ICD-10-CM | POA: Diagnosis not present

## 2018-02-14 DIAGNOSIS — I2510 Atherosclerotic heart disease of native coronary artery without angina pectoris: Secondary | ICD-10-CM | POA: Diagnosis not present

## 2018-02-14 DIAGNOSIS — Z8249 Family history of ischemic heart disease and other diseases of the circulatory system: Secondary | ICD-10-CM | POA: Diagnosis not present

## 2018-02-14 DIAGNOSIS — R0602 Shortness of breath: Secondary | ICD-10-CM | POA: Insufficient documentation

## 2018-02-14 DIAGNOSIS — E785 Hyperlipidemia, unspecified: Secondary | ICD-10-CM | POA: Diagnosis not present

## 2018-02-14 DIAGNOSIS — R079 Chest pain, unspecified: Secondary | ICD-10-CM | POA: Diagnosis not present

## 2018-02-14 LAB — MYOCARDIAL PERFUSION IMAGING
CHL CUP NUCLEAR SDS: 2
CHL CUP NUCLEAR SRS: 3
CHL CUP NUCLEAR SSS: 4
LHR: 0.32
LV dias vol: 119 mL (ref 46–106)
LV sys vol: 44 mL
NUC STRESS TID: 1.09
Peak HR: 59 {beats}/min
Rest HR: 47 {beats}/min

## 2018-02-14 LAB — HEPATIC FUNCTION PANEL
ALBUMIN: 3.9 g/dL (ref 3.5–4.7)
ALK PHOS: 101 IU/L (ref 39–117)
ALT: 8 IU/L (ref 0–32)
AST: 13 IU/L (ref 0–40)
BILIRUBIN, DIRECT: 0.17 mg/dL (ref 0.00–0.40)
Bilirubin Total: 0.5 mg/dL (ref 0.0–1.2)
TOTAL PROTEIN: 7.1 g/dL (ref 6.0–8.5)

## 2018-02-14 LAB — LIPID PANEL
CHOL/HDL RATIO: 2.8 ratio (ref 0.0–4.4)
Cholesterol, Total: 145 mg/dL (ref 100–199)
HDL: 51 mg/dL (ref >39)
LDL Calculated: 81 mg/dL (ref 0–99)
Triglycerides: 65 mg/dL (ref 0–149)
VLDL Cholesterol Cal: 13 mg/dL (ref 5–40)

## 2018-02-14 MED ORDER — TECHNETIUM TC 99M TETROFOSMIN IV KIT
32.2000 | PACK | Freq: Once | INTRAVENOUS | Status: AC | PRN
  Administered 2018-02-14: 32.2 via INTRAVENOUS
  Filled 2018-02-14: qty 33

## 2018-02-14 MED ORDER — REGADENOSON 0.4 MG/5ML IV SOLN
0.4000 mg | Freq: Once | INTRAVENOUS | Status: AC
  Administered 2018-02-14: 0.4 mg via INTRAVENOUS

## 2018-02-14 MED ORDER — TECHNETIUM TC 99M TETROFOSMIN IV KIT
10.3000 | PACK | Freq: Once | INTRAVENOUS | Status: AC | PRN
  Administered 2018-02-14: 10.3 via INTRAVENOUS
  Filled 2018-02-14: qty 11

## 2018-02-21 ENCOUNTER — Telehealth: Payer: Self-pay | Admitting: *Deleted

## 2018-02-21 NOTE — Telephone Encounter (Signed)
Returned the patient's call and moved her appt from 6/28 to 6/19

## 2018-02-22 ENCOUNTER — Inpatient Hospital Stay: Payer: Medicare Other | Attending: Gynecologic Oncology | Admitting: Gynecologic Oncology

## 2018-02-22 ENCOUNTER — Inpatient Hospital Stay: Payer: Medicare Other

## 2018-02-22 ENCOUNTER — Encounter: Payer: Self-pay | Admitting: Gynecologic Oncology

## 2018-02-22 VITALS — BP 157/72 | HR 60 | Temp 98.3°F | Resp 18 | Ht 66.0 in | Wt 199.6 lb

## 2018-02-22 DIAGNOSIS — Z91041 Radiographic dye allergy status: Secondary | ICD-10-CM

## 2018-02-22 DIAGNOSIS — R971 Elevated cancer antigen 125 [CA 125]: Secondary | ICD-10-CM

## 2018-02-22 DIAGNOSIS — N949 Unspecified condition associated with female genital organs and menstrual cycle: Secondary | ICD-10-CM

## 2018-02-22 DIAGNOSIS — L821 Other seborrheic keratosis: Secondary | ICD-10-CM | POA: Diagnosis not present

## 2018-02-22 DIAGNOSIS — Z85038 Personal history of other malignant neoplasm of large intestine: Secondary | ICD-10-CM | POA: Diagnosis not present

## 2018-02-22 DIAGNOSIS — L820 Inflamed seborrheic keratosis: Secondary | ICD-10-CM | POA: Diagnosis not present

## 2018-02-22 DIAGNOSIS — D485 Neoplasm of uncertain behavior of skin: Secondary | ICD-10-CM | POA: Diagnosis not present

## 2018-02-22 LAB — BASIC METABOLIC PANEL
ANION GAP: 9 (ref 3–11)
BUN: 17 mg/dL (ref 7–26)
CO2: 31 mmol/L — AB (ref 22–29)
CREATININE: 1.01 mg/dL (ref 0.60–1.10)
Calcium: 10 mg/dL (ref 8.4–10.4)
Chloride: 97 mmol/L — ABNORMAL LOW (ref 98–109)
GFR, EST AFRICAN AMERICAN: 59 mL/min — AB (ref >60)
GFR, EST NON AFRICAN AMERICAN: 51 mL/min — AB (ref >60)
GLUCOSE: 99 mg/dL (ref 70–140)
Potassium: 3.4 mmol/L — ABNORMAL LOW (ref 3.5–5.1)
Sodium: 137 mmol/L (ref 136–145)

## 2018-02-22 MED ORDER — PREDNISONE 50 MG PO TABS: ORAL_TABLET | ORAL | 0 refills | Status: DC

## 2018-02-22 MED ORDER — DIPHENHYDRAMINE HCL 50 MG PO TABS: 50.0000 mg | ORAL_TABLET | Freq: Once | ORAL | 0 refills | Status: DC

## 2018-02-22 NOTE — Progress Notes (Signed)
Follow-up Note: Gyn-Onc  Consult was requested by Dr. Marlou Starks for the evaluation of [name] 83 y.o. female  CC:  Chief Complaint  Patient presents with  . Elevated CA-125  thickened left ovary on imaing  Assessment/Plan:  [name]  is a 83 y.o.  year old with diverticulitis and mildly elevated CA 125. The CA 125 has fallen some since original testing which is very reassuring. Additionally, diverticulitis is a reasonable cause for elevated CA 125.  Given that there is no longer a plan for a pelvic surgery for diverticulitis, I think it is reasonable to recheck the CA 125 and CT scan. If these are stable, I recommend no additional follow-up at this time as malignancy is highly unlikely.   HPI: [name] is a 83 year old parous woman who was seen in consultation at the request of Dr Marlou Starks for left adnexal fullness and elevated CA 125.   The patient has a history of hospitalization for sigmoid diverticulitis with abscess requiring percutaneous drainage.  CT scan of the abdomen and pelvis on November 05, 2017 revealed status post right colectomy.  Sigmoid diverticulosis is noted.  There is irregular gas collection extending from the left adnexal region along the track of previous drainage catheter along the left anterior abdominal wall.  The uterus and right adnexal regions are unremarkable.  There is an air collection seen extending from the left adnexal region adjacent to the sigmoid colon towards the anterior skin surface of the left lower quadrant.  The patient is not draining stool from the abdomen.  She has been intimately on antibiotics since her diverticulitis episode.  She has some left lower quadrant pains.  She has a remote history of right-sided colon cancer for which she has had a partial colectomy in 2003 via laparotomy.  She takes Coumadin for atrial fibrillation.  CA 125 on 07/19/17 was 97, and repeated on 09/07/17 was 52.  Her family history is significant for a history of colon  cancer.   Surgery was initially scheduled for April, 2019 but canceled due to her needing further work-up for possible stroke.  Interval Hx:  Her left sided pain resolved, and she began experiencing right sided pain. The patient and Dr Marlou Starks decided that scheduling a left colectomy was not indicated at this time as she has symptomatically improved.  Current Meds:  Outpatient Encounter Medications as of 02/22/2018  Medication Sig  . acetaminophen (TYLENOL) 500 MG tablet Take 500-1,000 mg by mouth every 6 (six) hours as needed for headache (pain).  Marland Kitchen amLODipine (NORVASC) 5 MG tablet TAKE ONE TABLET BY MOUTH EVERY DAY  . apixaban (ELIQUIS) 5 MG TABS tablet Take 1 tablet (5 mg total) by mouth 2 (two) times daily.  Marland Kitchen augmented betamethasone dipropionate (DIPROLENE-AF) 0.05 % ointment Apply 1 application topically 2 (two) times daily as needed (rash/dry skin).   . cetirizine (ZYRTEC) 10 MG tablet Take 10 mg by mouth daily as needed for allergies.  . clonazePAM (KLONOPIN) 0.5 MG tablet TAKE 1 TABLET BY MOUTH TWICE DAILY AS NEEDED FOR ANXIETY (Patient taking differently: TAKE 1 TABLET (0.5 MG) BY MOUTH TWICE DAILY AS NEEDED FOR ANXIETY)  . cloNIDine (CATAPRES) 0.1 MG tablet Take 1 tablet (0.1 mg total) by mouth 2 (two) times daily.  . Guaifenesin (MUCINEX MAXIMUM STRENGTH) 1200 MG TB12 Take 1,200 mg by mouth daily as needed (congestion).  Marland Kitchen HYDROcodone-acetaminophen (NORCO) 7.5-325 MG tablet Take 1 tablet by mouth every 6 (six) hours as needed for  severe pain.  Marland Kitchen losartan (COZAAR) 100 MG tablet TAKE ONE TABLET BY MOUTH EVERY DAY  . metoprolol succinate (TOPROL XL) 25 MG 24 hr tablet Take 1 tablet (25 mg total) by mouth daily.  . mirtazapine (REMERON) 15 MG tablet TAKE 1 TABLET BY MOUTH AT BEDTIME  . nitroGLYCERIN (NITROSTAT) 0.4 MG SL tablet PLACE ONE TABLET UNDER THE TONGUE EVERY FIVE MINUTES AS NEEDED FOR CHEST PAIN. [place] REPEAT FOR THREE DOSES.  Marland Kitchen omeprazole (PRILOSEC OTC) 20 MG tablet Take 20 mg by  mouth daily with supper.  . ondansetron (ZOFRAN ODT) 4 MG disintegrating tablet Take 1 tablet (4 mg total) by mouth every 8 (eight) hours as needed for nausea or vomiting.  Marland Kitchen OVER THE COUNTER MEDICATION Place 1 drop into both eyes daily as needed (dry eyes). Over the counter lubricating eye drops  . spironolactone (ALDACTONE) 25 MG tablet TAKE 1/2 (ONE-HALF) TABLET BY MOUTH ONCE DAILY  . tiZANidine (ZANAFLEX) 4 MG tablet Take 1 tablet (4 mg total) by mouth every 8 (eight) hours as needed for muscle spasms.  Marland Kitchen torsemide (DEMADEX) 20 MG tablet Take 60 mg by mouth daily.    No facility-administered encounter medications on file as of 02/22/2018.     Allergy:  Allergies  Allergen Reactions  . Contrast Media [Iodinated Diagnostic Agents] Hives and Other (See Comments)    Per pt strong burning sensation starting in chest radiating outward   . Clonidine Derivatives Other (See Comments)    Throat dry  . Statins Other (See Comments)    "bones hurt"  . Sulfa Antibiotics Diarrhea    Tremors   . Celebrex [Celecoxib] Rash  . Isosorbide Nitrate Itching and Rash  . Other Itching and Rash    Plastic and paper tape and heart monitor pads  . Tape Itching and Rash    Red Where applied and will spread    Social Hx:   Social History   Socioeconomic History  . Marital status: Married    Spouse name: Not on file  . Number of children: Not on file  . Years of education: Not on file  . Highest education level: Not on file  Occupational History  . Not on file  Social Needs  . Financial resource strain: Not on file  . Food insecurity:    Worry: Not on file    Inability: Not on file  . Transportation needs:    Medical: Not on file    Non-medical: Not on file  Tobacco Use  . Smoking status: Never Smoker  . Smokeless tobacco: Never Used  Substance and Sexual Activity  . Alcohol use: No  . Drug use: No  . Sexual activity: Not Currently  Lifestyle  . Physical activity:    Days per week: Not  on file    Minutes per session: Not on file  . Stress: Not on file  Relationships  . Social connections:    Talks on phone: Not on file    Gets together: Not on file    Attends religious service: Not on file    Active member of club or organization: Not on file    Attends meetings of clubs or organizations: Not on file    Relationship status: Not on file  . Intimate partner violence:    Fear of current or ex partner: Not on file    Emotionally abused: Not on file    Physically abused: Not on file    Forced sexual activity: Not on file  Other Topics Concern  . Not on file  Social History Narrative  . Not on file    Past Surgical Hx:  Past Surgical History:  Procedure Laterality Date  . APPENDECTOMY    . BREAST SURGERY     lumpectomy left  . CARDIAC CATHETERIZATION    . CARDIAC CATHETERIZATION N/A 07/28/2015   Procedure: Left Heart Cath and Coronary Angiography;  Surgeon: Peter M Martinique, MD;  Location: [facility];  Service: Cardiovascular;  Laterality: N/A;  . CHOLECYSTECTOMY    . COLECTOMY     right side  . EXCISION OF ACCESSORY NIPPLE Bilateral 05/30/2013   Procedure: BILATERAL NIPPLE BIOPSY;  Surgeon: Merrie Roof, MD;  Location: [facility];  Service: General;  Laterality: Bilateral;  . EYE SURGERY Bilateral 12   cataracts  . IR RADIOLOGIST EVAL & MGMT  07/26/2017  . SPLIT NIGHT STUDY  02/02/2016        Past Medical Hx:  Past Medical History:  Diagnosis Date  . Allergy    rhinitis  . Aortic stenosis    mild by echo 06/2017  . Arthritis   . Bradycardia    a. 10/2017 -> beta blocker cut back due to HR 39.  . Breast cancer ([place]) 01/06/2012  . Cancer [facility])    right colon and left breast  . Chronic diastolic CHF (congestive heart failure) ([place])   . Colon cancer ([place]) 01/06/2012  . COPD (chronic obstructive pulmonary disease) ([place])    pt. denies  . Coronary artery disease 2006   a.  NSTEMI in 2016, cath showed 15% prox-mid RCA, 20% prox LAD, EF 25-35% by cath  and 35-40% -> felt due to Takotsubo cardiomyopathy.  . Dilated aortic root ([place])    60mmHg by echo 06/2017  . Diverticulosis   . Dyspnea   . Edema extremities   . GERD (gastroesophageal reflux disease)   . Hernia   . Hiatal hernia    denies  . Hyperlipidemia   . Hypertension   . Mild aortic stenosis    echo 11/2015 but not noted on echo 06/2016  . Osteopenia   . Permanent atrial fibrillation (HCC)    chronic atrial fibrillation  . Pneumonia    hx child  . Pulmonary HTN ([place])    a. moderate to severe PASP 40mmHg echo 11/2015 - now 53mmHg by echo 06/2017. CTA chest in 11/16 with no PE. PFTs in 7/15 with mild obstructive lung disease. She had a negative sleep study in 2017. b. Felt due to left sided HF.  . Takotsubo syndrome 07/29/2015   a. EF 35-40% by echo; akinesis of mid-apical anteroseptal and apical myocardium.  EF now normalized on echo 11/2015    Past Gynecological History:   No LMP recorded. Patient is postmenopausal.  Family Hx:  Family History  Problem Relation Age of Onset  . Heart disease Mother   . Heart attack Mother   . Cancer Sister        stomach and colon  . Heart disease Brother 52  . Hypertension Father   . Cancer Sister   . Stroke Neg Hx     Review of Systems:  Constitutional  Feels unwell with pain  ENT Normal appearing ears and nares bilaterally Skin/Breast  No rash, sores, jaundice, itching, dryness Cardiovascular  No chest pain, shortness of breath, or edema  Pulmonary  No cough or wheeze.  Gastro Intestinal  No nausea, vomitting, or diarrhoea. No bright red blood per rectum, +no abdominal  pain, no change in bowel movement, or constipation.  Genito Urinary  No frequency, urgency, dysuria, no pelvic pain, no bleeding Musculo Skeletal  No myalgia, arthralgia, joint swelling or pain  Neurologic  No weakness, numbness, change in gait,  Psychology  No depression, anxiety, insomnia.   Vitals:  Blood pressure (!) 157/72, pulse 60,  temperature 98.3 F (36.8 C), temperature source Oral, resp. rate 18, height 5\' 6"  (1.676 m), weight 199 lb 9.6 oz (90.5 kg), SpO2 98 %.  Physical Exam: WD in NAD Neck  Supple NROM, without any enlargements.  Lymph Node Survey No cervical supraclavicular or inguinal adenopathy Cardiovascular  Pulse normal rate, regularity and rhythm. S1 and S2 normal.  Lungs  Clear to auscultation bilateraly, without wheezes/crackles/rhonchi. Good air movement.  Skin  No rash/lesions/breakdown  Psychiatry  Alert and oriented to person, place, and time  Abdomen  Normoactive bowel sounds, abdomen soft, non-tender and obese without evidence of hernia.  Back No CVA tenderness Genito Urinary  Vulva/vagina: Normal external female genitalia.  No lesions. No discharge or bleeding.  Bladder/urethra:  No lesions or masses, well supported bladder  Vagina: normal  Cervix: Normal appearing, no lesions.  Uterus:  Small, mobile, no parametrial involvement or nodularity.  Adnexa: no discrete masses. There is generalized tenderness Rectal  deferred Extremities  No bilateral cyanosis, clubbing or edema.   Thereasa Solo, MD  02/22/2018, 3:41 PM

## 2018-02-22 NOTE — Patient Instructions (Addendum)
Dr Denman George is rechecking your tumor marker (blood test) and CT scan to re-evaluate the left ovary.  If it appears stable and unchanged, you do not require surgical intervention at this time.  Dr Serita Grit office will contact you with results.  Please contact her at [place].   For the CT scan, you will need to do a pre-medication regimen due to your contrast allergy:  Three doses of 50 mg oral prednisone administered 13, 7, and 1 hour prior to your CT scan and then 50 mg of benadryl orally administered 1 hour prior to your CT scan.

## 2018-02-22 NOTE — Addendum Note (Signed)
Addended by: Joylene John D on: 02/22/2018 03:53 PM   Modules accepted: Orders

## 2018-02-23 ENCOUNTER — Telehealth: Payer: Self-pay

## 2018-02-23 LAB — CA 125: CANCER ANTIGEN (CA) 125: 40.5 U/mL — AB (ref 0.0–38.1)

## 2018-02-23 NOTE — Telephone Encounter (Signed)
Outgoing call to patient, no answer, left her a VM to return our call. When she calls back, per Joylene John, NP give her results as:   CA 125 results as "40.5, it is decreasing".

## 2018-02-24 ENCOUNTER — Telehealth: Payer: Self-pay

## 2018-02-24 NOTE — Telephone Encounter (Signed)
Returned pt's call regarding she is returning our call from yesterday.  Let pt know per Joylene John NP her recent CA-125 results as 40.5 'is decreasing'.  I also let her know her potassium is a little low at 3.4 and we discussed increasing potassium by intake of potassium rich foods and gave her examples.  Pt voiced understanding. No other needs per pt at this time.

## 2018-03-01 ENCOUNTER — Encounter (HOSPITAL_COMMUNITY): Payer: Self-pay

## 2018-03-01 ENCOUNTER — Ambulatory Visit (HOSPITAL_COMMUNITY)
Admission: RE | Admit: 2018-03-01 | Discharge: 2018-03-01 | Disposition: A | Discharge status: Home / Self Care | Payer: Medicare Other | Source: Ambulatory Visit | Attending: Gynecologic Oncology | Admitting: Gynecologic Oncology

## 2018-03-01 DIAGNOSIS — R1909 Other intra-abdominal and pelvic swelling, mass and lump: Secondary | ICD-10-CM | POA: Diagnosis not present

## 2018-03-01 DIAGNOSIS — K439 Ventral hernia without obstruction or gangrene: Secondary | ICD-10-CM | POA: Diagnosis not present

## 2018-03-01 DIAGNOSIS — I517 Cardiomegaly: Secondary | ICD-10-CM | POA: Insufficient documentation

## 2018-03-01 DIAGNOSIS — R935 Abnormal findings on diagnostic imaging of other abdominal regions, including retroperitoneum: Secondary | ICD-10-CM | POA: Insufficient documentation

## 2018-03-01 DIAGNOSIS — N949 Unspecified condition associated with female genital organs and menstrual cycle: Secondary | ICD-10-CM | POA: Diagnosis not present

## 2018-03-01 DIAGNOSIS — I700 Atherosclerosis of aorta: Secondary | ICD-10-CM | POA: Insufficient documentation

## 2018-03-01 DIAGNOSIS — D71 Functional disorders of polymorphonuclear neutrophils: Secondary | ICD-10-CM | POA: Diagnosis not present

## 2018-03-01 MED ORDER — IOPAMIDOL (ISOVUE-300) INJECTION 61%
INTRAVENOUS | Status: AC
  Filled 2018-03-01: qty 100

## 2018-03-01 MED ORDER — IOPAMIDOL (ISOVUE-300) INJECTION 61%
100.0000 mL | Freq: Once | INTRAVENOUS | Status: AC | PRN
  Administered 2018-03-01: 100 mL via INTRAVENOUS

## 2018-03-03 ENCOUNTER — Ambulatory Visit: Payer: Medicare Other | Admitting: Gynecologic Oncology

## 2018-03-07 ENCOUNTER — Telehealth: Payer: Self-pay | Admitting: *Deleted

## 2018-03-07 NOTE — Telephone Encounter (Signed)
Patient called requesting her scan results. Explained to the patient that "we have the results but Dr. Denman George needs to return the results. We will call you later this afternoon or tomorrow." Patient verbalized understanding.

## 2018-03-08 ENCOUNTER — Telehealth: Payer: Self-pay

## 2018-03-08 NOTE — Telephone Encounter (Signed)
Outgoing call to pt per Joylene John NP regarding her CT abd/pelvis "Scan looks better, no intervention from GYN oncology standpoint, scan is showing changes with her spine and PCP can follow". I let her know per Lenna Sciara NP, we are faxing her CT to her primary and Dr Marlou Starks.  Pt agreeable. Instructed her to call her PCP which is Karis Juba PA or Dr Jenna Luo at [facility] so she can f/u up about changes with her spine.  Pt voiced understanding.  I faxed CT results to their office [phone] (fax) at this time. Will fax to Dr Marlou Starks surgeon at [phone] as well. No other needs per pt at this time.

## 2018-03-13 ENCOUNTER — Encounter: Payer: Self-pay | Admitting: Physician Assistant

## 2018-03-13 ENCOUNTER — Ambulatory Visit: Payer: Medicare Other | Admitting: Physician Assistant

## 2018-03-13 VITALS — BP 150/60 | HR 57 | Temp 98.0°F | Resp 12 | Ht 65.0 in | Wt 205.0 lb

## 2018-03-13 DIAGNOSIS — Z712 Person consulting for explanation of examination or test findings: Secondary | ICD-10-CM

## 2018-03-13 NOTE — Progress Notes (Signed)
Patient ID: [name] MRN: [id number], DOB: 1935/06/12, 83 y.o. Date of Encounter: @DATE @  Chief Complaint:  Chief Complaint  Patient presents with  . discuss CT scan    HPI: 83 y.o. year old female  presents with above.   I reviewed that recent CT was ordered by Dr. Denman George.  Patient reports "Dr. Denman George told her that she does not have to do any procedure right now, no follow-up indicated right now from Dr. Serita Grit standpoint.  Told her to come here to discuss findings regarding her vertebrae.  Says that she also is to follow-up with Dr. Marlou Starks and has appointment to see him for follow-up."  I reviewed that her CT abdomen pelvis that was performed 03/01/2018 shows "increase in inferior endplate collapse at L1, with up to 90% loss of vertebral body height in the involved segment, this was not present on 11/05/2017 is considered subacute." Discussed with patient that the scan did show some problem with her vertebrae in her low back and asked her if she is having significant pain in low back.   Her response is that she "had 2 or 3 visits here and at that time was having significant pain in that area but since the time of those visits the pain in that area is better."  I also reviewed that the CT shows findings suspicious for a fistula. She states that she has follow-up with Dr. Marlou Starks scheduled also.  Past Medical History:  Diagnosis Date  . Allergy    rhinitis  . Aortic stenosis    mild by echo 06/2017  . Arthritis   . Bradycardia    a. 10/2017 -> beta blocker cut back due to HR 39.  . Breast cancer ([place]) 01/06/2012  . Cancer [facility])    right colon and left breast  . Chronic diastolic CHF (congestive heart failure) ([place])   . Colon cancer ([place]) 01/06/2012  . COPD (chronic obstructive pulmonary disease) ([place])    pt. denies  . Coronary artery disease 2006   a.  NSTEMI in 2016, cath showed 15% prox-mid RCA, 20% prox LAD, EF 25-35% by cath and 35-40% -> felt due to Takotsubo cardiomyopathy.  .  Dilated aortic root ([place])    35mmHg by echo 06/2017  . Diverticulosis   . Dyspnea   . Edema extremities   . GERD (gastroesophageal reflux disease)   . Hernia   . Hiatal hernia    denies  . Hyperlipidemia   . Hypertension   . Mild aortic stenosis    echo 11/2015 but not noted on echo 06/2016  . Osteopenia   . Permanent atrial fibrillation (HCC)    chronic atrial fibrillation  . Pneumonia    hx child  . Pulmonary HTN ([place])    a. moderate to severe PASP 8mmHg echo 11/2015 - now 85mmHg by echo 06/2017. CTA chest in 11/16 with no PE. PFTs in 7/15 with mild obstructive lung disease. She had a negative sleep study in 2017. b. Felt due to left sided HF.  . Takotsubo syndrome 07/29/2015   a. EF 35-40% by echo; akinesis of mid-apical anteroseptal and apical myocardium.  EF now normalized on echo 11/2015     Home Meds: Outpatient Medications Prior to Visit  Medication Sig Dispense Refill  . acetaminophen (TYLENOL) 500 MG tablet Take 500-1,000 mg by mouth every 6 (six) hours as needed for headache (pain).    Marland Kitchen amLODipine (NORVASC) 5 MG tablet TAKE ONE TABLET BY MOUTH EVERY DAY 30  tablet 3  . apixaban (ELIQUIS) 5 MG TABS tablet Take 1 tablet (5 mg total) by mouth 2 (two) times daily. 60 tablet 3  . augmented betamethasone dipropionate (DIPROLENE-AF) 0.05 % ointment Apply 1 application topically 2 (two) times daily as needed (rash/dry skin).     . cetirizine (ZYRTEC) 10 MG tablet Take 10 mg by mouth daily as needed for allergies.    . clonazePAM (KLONOPIN) 0.5 MG tablet TAKE 1 TABLET BY MOUTH TWICE DAILY AS NEEDED FOR ANXIETY (Patient taking differently: TAKE 1 TABLET (0.5 MG) BY MOUTH TWICE DAILY AS NEEDED FOR ANXIETY) 60 tablet 2  . cloNIDine (CATAPRES) 0.1 MG tablet Take 1 tablet (0.1 mg total) by mouth 2 (two) times daily. 180 tablet 2  . Guaifenesin (MUCINEX MAXIMUM STRENGTH) 1200 MG TB12 Take 1,200 mg by mouth daily as needed (congestion).    Marland Kitchen losartan (COZAAR) 100 MG tablet TAKE ONE TABLET  BY MOUTH EVERY DAY 30 tablet 3  . metoprolol succinate (TOPROL XL) 25 MG 24 hr tablet Take 1 tablet (25 mg total) by mouth daily. 30 tablet 11  . mirtazapine (REMERON) 15 MG tablet TAKE 1 TABLET BY MOUTH AT BEDTIME 90 tablet 0  . nitroGLYCERIN (NITROSTAT) 0.4 MG SL tablet PLACE ONE TABLET UNDER THE TONGUE EVERY FIVE MINUTES AS NEEDED FOR CHEST PAIN. [place] REPEAT FOR THREE DOSES. 30 tablet 2  . omeprazole (PRILOSEC OTC) 20 MG tablet Take 20 mg by mouth daily with supper.    . ondansetron (ZOFRAN ODT) 4 MG disintegrating tablet Take 1 tablet (4 mg total) by mouth every 8 (eight) hours as needed for nausea or vomiting. 20 tablet 0  . OVER THE COUNTER MEDICATION Place 1 drop into both eyes daily as needed (dry eyes). Over the counter lubricating eye drops    . spironolactone (ALDACTONE) 25 MG tablet TAKE 1/2 (ONE-HALF) TABLET BY MOUTH ONCE DAILY 45 tablet 3  . tiZANidine (ZANAFLEX) 4 MG tablet Take 1 tablet (4 mg total) by mouth every 8 (eight) hours as needed for muscle spasms. 30 tablet 0  . torsemide (DEMADEX) 20 MG tablet Take 60 mg by mouth daily.     . diphenhydrAMINE (BENADRYL) 50 MG tablet Take 1 tablet (50 mg total) by mouth once for 1 dose. 1 tablet 0  . HYDROcodone-acetaminophen (NORCO) 7.5-325 MG tablet Take 1 tablet by mouth every 6 (six) hours as needed for severe pain. 30 tablet 0  . predniSONE (DELTASONE) 50 MG tablet Take one tablet 13 hours, 7 hours, and 1 hr before your CT scan 3 tablet 0   No facility-administered medications prior to visit.     Allergies:  Allergies  Allergen Reactions  . Contrast Media [Iodinated Diagnostic Agents] Hives and Other (See Comments)    Per pt strong burning sensation starting in chest radiating outward   . Clonidine Derivatives Other (See Comments)    Throat dry  . Statins Other (See Comments)    "bones hurt"  . Sulfa Antibiotics Diarrhea    Tremors   . Celebrex [Celecoxib] Rash  . Isosorbide Nitrate Itching and Rash  . Other Itching and  Rash    Plastic and paper tape and heart monitor pads  . Tape Itching and Rash    Red Where applied and will spread    Social History   Socioeconomic History  . Marital status: Married    Spouse name: Not on file  . Number of children: Not on file  . Years of education: Not on file  .  Highest education level: Not on file  Occupational History  . Not on file  Social Needs  . Financial resource strain: Not on file  . Food insecurity:    Worry: Not on file    Inability: Not on file  . Transportation needs:    Medical: Not on file    Non-medical: Not on file  Tobacco Use  . Smoking status: Never Smoker  . Smokeless tobacco: Never Used  Substance and Sexual Activity  . Alcohol use: No  . Drug use: No  . Sexual activity: Not Currently  Lifestyle  . Physical activity:    Days per week: Not on file    Minutes per session: Not on file  . Stress: Not on file  Relationships  . Social connections:    Talks on phone: Not on file    Gets together: Not on file    Attends religious service: Not on file    Active member of club or organization: Not on file    Attends meetings of clubs or organizations: Not on file    Relationship status: Not on file  . Intimate partner violence:    Fear of current or ex partner: Not on file    Emotionally abused: Not on file    Physically abused: Not on file    Forced sexual activity: Not on file  Other Topics Concern  . Not on file  Social History Narrative  . Not on file    Family History  Problem Relation Age of Onset  . Heart disease Mother   . Heart attack Mother   . Cancer Sister        stomach and colon  . Heart disease Brother 15  . Hypertension Father   . Cancer Sister   . Stroke Neg Hx      Review of Systems:  See HPI for pertinent ROS. All other ROS negative.    Physical Exam: Blood pressure (!) 150/60, pulse (!) 57, temperature 98 F (36.7 C), temperature source Oral, resp. rate 12, height 5\' 5"  (1.651 m), weight 93  kg (205 lb), SpO2 95 %., Body mass index is 34.11 kg/m. General: Obese WF. Appears in no acute distress. Neck: Supple. No thyromegaly. No lymphadenopathy. Lungs: Clear bilaterally to auscultation without wheezes, rales, or rhonchi. Breathing is unlabored. Heart: RRR with S1 S2. No murmurs, rubs, or gallops. Musculoskeletal:  Strength and tone normal for age. Extremities/Skin: Warm and dry.  Neuro: Alert and oriented X 3. Moves all extremities spontaneously. Gait is normal. CNII-XII grossly in tact. Psych:  Responds to questions appropriately with a normal affect.     ASSESSMENT AND PLAN:  83 y.o. year old female with   1. Encounter to discuss test results Discussed that recent CT shows increase in inferior endplate collapse at L1, with with up to 90% loss of vertebral body height in the involved segment, this was not present on 11/05/2017 is considered subacute. Today she reports that pain is improved at this time. She is ambulating and getting up and down from her chair without difficulty to visit visit today. Reviewed the DEXA scan in 2008 had showed T score -2.5 and osteoporosis.  Not have her other records from that date.  Regarding possible fistula on CT--- she has follow-up scheduled with Dr. Marlou Starks.  She is currently stable today so we will continue current treatment with no changes in treatment at this time.   [address] [place], [place], [facility] 03/13/2018 4:15 PM

## 2018-03-15 ENCOUNTER — Ambulatory Visit: Payer: Medicare Other | Admitting: Physician Assistant

## 2018-03-15 ENCOUNTER — Encounter: Payer: Self-pay | Admitting: Physician Assistant

## 2018-03-15 VITALS — BP 128/62 | HR 57 | Temp 98.3°F | Resp 14 | Ht 65.0 in | Wt 201.0 lb

## 2018-03-15 DIAGNOSIS — R0982 Postnasal drip: Secondary | ICD-10-CM

## 2018-03-15 MED ORDER — FEXOFENADINE HCL 180 MG PO TABS: 180.0000 mg | ORAL_TABLET | Freq: Every day | ORAL | 3 refills | Status: DC

## 2018-03-15 NOTE — Progress Notes (Signed)
Patient ID: [name] MRN: [id number], DOB: 1935/08/04, 83 y.o. Date of Encounter: 03/15/2018, 11:19 AM    Chief Complaint:  Chief Complaint  Patient presents with  . Nasal Congestion     HPI: 83 y.o. year old female presents with above.   She states that she is having "sinus drainage ".  Says that it is draining down the back of her throat.  Says that none of it is coming out of her nose for her to see if it is watery clear/mucousy.  Says it is just draining down the back of her throat and bothering her.  Has been using Zyrtec with no relief.     Home Meds:   Outpatient Medications Prior to Visit  Medication Sig Dispense Refill  . acetaminophen (TYLENOL) 500 MG tablet Take 500-1,000 mg by mouth every 6 (six) hours as needed for headache (pain).    Marland Kitchen amLODipine (NORVASC) 5 MG tablet TAKE ONE TABLET BY MOUTH EVERY DAY 30 tablet 3  . apixaban (ELIQUIS) 5 MG TABS tablet Take 1 tablet (5 mg total) by mouth 2 (two) times daily. 60 tablet 3  . augmented betamethasone dipropionate (DIPROLENE-AF) 0.05 % ointment Apply 1 application topically 2 (two) times daily as needed (rash/dry skin).     . cetirizine (ZYRTEC) 10 MG tablet Take 10 mg by mouth daily as needed for allergies.    . clonazePAM (KLONOPIN) 0.5 MG tablet TAKE 1 TABLET BY MOUTH TWICE DAILY AS NEEDED FOR ANXIETY (Patient taking differently: TAKE 1 TABLET (0.5 MG) BY MOUTH TWICE DAILY AS NEEDED FOR ANXIETY) 60 tablet 2  . cloNIDine (CATAPRES) 0.1 MG tablet Take 1 tablet (0.1 mg total) by mouth 2 (two) times daily. 180 tablet 2  . Guaifenesin (MUCINEX MAXIMUM STRENGTH) 1200 MG TB12 Take 1,200 mg by mouth daily as needed (congestion).    Marland Kitchen losartan (COZAAR) 100 MG tablet TAKE ONE TABLET BY MOUTH EVERY DAY 30 tablet 3  . metoprolol succinate (TOPROL XL) 25 MG 24 hr tablet Take 1 tablet (25 mg total) by mouth daily. 30 tablet 11  . mirtazapine (REMERON) 15 MG tablet TAKE 1 TABLET BY MOUTH AT BEDTIME 90 tablet 0  . nitroGLYCERIN  (NITROSTAT) 0.4 MG SL tablet PLACE ONE TABLET UNDER THE TONGUE EVERY FIVE MINUTES AS NEEDED FOR CHEST PAIN. Fraser REPEAT FOR THREE DOSES. 30 tablet 2  . omeprazole (PRILOSEC OTC) 20 MG tablet Take 20 mg by mouth daily with supper.    . ondansetron (ZOFRAN ODT) 4 MG disintegrating tablet Take 1 tablet (4 mg total) by mouth every 8 (eight) hours as needed for nausea or vomiting. 20 tablet 0  . OVER THE COUNTER MEDICATION Place 1 drop into both eyes daily as needed (dry eyes). Over the counter lubricating eye drops    . spironolactone (ALDACTONE) 25 MG tablet TAKE 1/2 (ONE-HALF) TABLET BY MOUTH ONCE DAILY 45 tablet 3  . tiZANidine (ZANAFLEX) 4 MG tablet Take 1 tablet (4 mg total) by mouth every 8 (eight) hours as needed for muscle spasms. 30 tablet 0  . torsemide (DEMADEX) 20 MG tablet Take 60 mg by mouth daily.      No facility-administered medications prior to visit.     Allergies:  Allergies  Allergen Reactions  . Contrast Media [Iodinated Diagnostic Agents] Hives and Other (See Comments)    Per pt strong burning sensation starting in chest radiating outward   . Clonidine Derivatives Other (See Comments)    Throat dry  . Statins Other (  See Comments)    "bones hurt"  . Sulfa Antibiotics Diarrhea    Tremors   . Celebrex [Celecoxib] Rash  . Isosorbide Nitrate Itching and Rash  . Other Itching and Rash    Plastic and paper tape and heart monitor pads  . Tape Itching and Rash    Red Where applied and will spread      Review of Systems: See HPI for pertinent ROS. All other ROS negative.    Physical Exam: Blood pressure 128/62, pulse (!) 57, temperature 98.3 F (36.8 C), temperature source Oral, resp. rate 14, height 5\' 5"  (1.651 m), weight 91.2 kg (201 lb), SpO2 95 %., Body mass index is 33.45 kg/m. General:  WF. Appears in no acute distress. HEENT: Normocephalic, atraumatic, eyes without discharge, sclera non-icteric, nares are without discharge. Bilateral auditory canals clear, TM's  are without perforation, pearly grey and translucent with reflective cone of light bilaterally. Oral cavity moist, posterior pharynx without exudate, erythema.  No tenderness with percussion to frontal or maxillary sinuses bilaterally.  Neck: Supple. No thyromegaly. No lymphadenopathy. Lungs: Clear bilaterally to auscultation without wheezes, rales, or rhonchi. Breathing is unlabored. Heart: Regular rhythm. II/ VI murmur Msk:  Strength and tone normal for age. Extremities/Skin: Warm and dry.  No LE edema.  Neuro: Alert and oriented X 3. Moves all extremities spontaneously. Gait is normal. CNII-XII grossly in tact. Psych:  Responds to questions appropriately with a normal affect.     ASSESSMENT AND PLAN:  83 y.o. year old female with  1. Postnasal drip She has been taking Zyrtec.  Will have her change to trying Allegra to see if this will control her drainage.  Follow-up if needed. - fexofenadine (ALLEGRA ALLERGY) 180 MG tablet; Take 1 tablet (180 mg total) by mouth daily.  Dispense: 30 tablet; Refill: 3   Signed, [address] [place], [place], [facility] 03/15/2018 11:19 AM

## 2018-03-18 ENCOUNTER — Other Ambulatory Visit (HOSPITAL_COMMUNITY): Payer: Self-pay | Admitting: Cardiology

## 2018-03-21 ENCOUNTER — Other Ambulatory Visit (HOSPITAL_COMMUNITY): Payer: Self-pay | Admitting: Cardiology

## 2018-03-21 ENCOUNTER — Other Ambulatory Visit: Payer: Self-pay | Admitting: Cardiology

## 2018-03-21 MED ORDER — TORSEMIDE 20 MG PO TABS: 60.0000 mg | ORAL_TABLET | Freq: Every day | ORAL | 11 refills | Status: DC

## 2018-03-27 ENCOUNTER — Telehealth: Payer: Self-pay

## 2018-03-27 NOTE — Telephone Encounter (Signed)
Letter received from [facility] via fax stating that they have approved the pts non formulary exception request for Torsemide. Approval good until 03/24/2019.  I have notified the pts pharmacy.

## 2018-03-28 ENCOUNTER — Other Ambulatory Visit: Payer: Self-pay | Admitting: Cardiology

## 2018-03-28 ENCOUNTER — Other Ambulatory Visit (HOSPITAL_COMMUNITY): Payer: Self-pay | Admitting: *Deleted

## 2018-03-28 MED ORDER — METOPROLOL SUCCINATE ER 25 MG PO TB24: 25.0000 mg | ORAL_TABLET | Freq: Every day | ORAL | 3 refills | Status: DC

## 2018-03-28 MED ORDER — AMLODIPINE BESYLATE 5 MG PO TABS: 5.0000 mg | ORAL_TABLET | Freq: Every day | ORAL | 3 refills | Status: DC

## 2018-03-28 MED ORDER — LOSARTAN POTASSIUM 100 MG PO TABS: 100.0000 mg | ORAL_TABLET | Freq: Every day | ORAL | 3 refills | Status: DC

## 2018-03-28 NOTE — Telephone Encounter (Signed)
Pt's medication was sent to pt's pharmacy as requested. Confirmation received.  °

## 2018-03-30 ENCOUNTER — Encounter: Payer: Self-pay | Admitting: Physician Assistant

## 2018-03-30 ENCOUNTER — Ambulatory Visit: Payer: Medicare Other | Admitting: Physician Assistant

## 2018-03-30 VITALS — BP 122/50 | HR 71 | Temp 97.6°F | Resp 14 | Ht 65.0 in | Wt 194.4 lb

## 2018-03-30 DIAGNOSIS — L03113 Cellulitis of right upper limb: Secondary | ICD-10-CM | POA: Diagnosis not present

## 2018-03-30 MED ORDER — CEPHALEXIN 500 MG PO CAPS: 500.0000 mg | ORAL_CAPSULE | Freq: Four times a day (QID) | ORAL | 0 refills | Status: DC

## 2018-03-30 NOTE — Progress Notes (Signed)
Patient ID: [name] MRN: [id number], DOB: 1935/02/24, 83 y.o. Date of Encounter: 03/30/2018, 12:11 PM    Chief Complaint:  Chief Complaint  Patient presents with  . burn on right wrist     HPI: 83 y.o. year old female presents with above.   States that she got this burn to the lateral aspect of her right wrist 2 or 3 weeks ago.  Says that she thinks that grease popped out of her pan and just burned a small spot there.  Says that it was just a small spot but now recently has noticed that the area is getting bigger.  That is why she came in. Says that she does not think that she burned this area on the side of the oven putting something in or out of the oven.  Says that she always wears "oven mittens" when she does that.  Says that she is pretty sure just a drop of grease popped out to cause this burn.  No other concerns to address today.     Home Meds:   Outpatient Medications Prior to Visit  Medication Sig Dispense Refill  . acetaminophen (TYLENOL) 500 MG tablet Take 500-1,000 mg by mouth every 6 (six) hours as needed for headache (pain).    Marland Kitchen amLODipine (NORVASC) 5 MG tablet Take 1 tablet (5 mg total) by mouth daily. 30 tablet 3  . apixaban (ELIQUIS) 5 MG TABS tablet Take 1 tablet (5 mg total) by mouth 2 (two) times daily. 60 tablet 3  . augmented betamethasone dipropionate (DIPROLENE-AF) 0.05 % ointment Apply 1 application topically 2 (two) times daily as needed (rash/dry skin).     . cetirizine (ZYRTEC) 10 MG tablet Take 10 mg by mouth daily as needed for allergies.    . clonazePAM (KLONOPIN) 0.5 MG tablet TAKE 1 TABLET BY MOUTH TWICE DAILY AS NEEDED FOR ANXIETY (Patient taking differently: TAKE 1 TABLET (0.5 MG) BY MOUTH TWICE DAILY AS NEEDED FOR ANXIETY) 60 tablet 2  . cloNIDine (CATAPRES) 0.1 MG tablet Take 1 tablet (0.1 mg total) by mouth 2 (two) times daily. 180 tablet 2  . fexofenadine (ALLEGRA ALLERGY) 180 MG tablet Take 1 tablet (180 mg total) by mouth daily. 30 tablet  3  . Guaifenesin (MUCINEX MAXIMUM STRENGTH) 1200 MG TB12 Take 1,200 mg by mouth daily as needed (congestion).    Marland Kitchen losartan (COZAAR) 100 MG tablet Take 1 tablet (100 mg total) by mouth daily. 30 tablet 3  . metoprolol succinate (TOPROL XL) 25 MG 24 hr tablet Take 1 tablet (25 mg total) by mouth daily. 90 tablet 3  . mirtazapine (REMERON) 15 MG tablet TAKE 1 TABLET BY MOUTH AT BEDTIME 90 tablet 0  . nitroGLYCERIN (NITROSTAT) 0.4 MG SL tablet PLACE ONE TABLET UNDER THE TONGUE EVERY FIVE MINUTES AS NEEDED FOR CHEST PAIN. Petron REPEAT FOR THREE DOSES. 30 tablet 2  . omeprazole (PRILOSEC OTC) 20 MG tablet Take 20 mg by mouth daily with supper.    . ondansetron (ZOFRAN ODT) 4 MG disintegrating tablet Take 1 tablet (4 mg total) by mouth every 8 (eight) hours as needed for nausea or vomiting. 20 tablet 0  . OVER THE COUNTER MEDICATION Place 1 drop into both eyes daily as needed (dry eyes). Over the counter lubricating eye drops    . spironolactone (ALDACTONE) 25 MG tablet TAKE 1/2 (ONE-HALF) TABLET BY MOUTH ONCE DAILY 45 tablet 3  . torsemide (DEMADEX) 20 MG tablet Take 3 tablets (60 mg total) by mouth  daily. 90 tablet 11  . tiZANidine (ZANAFLEX) 4 MG tablet Take 1 tablet (4 mg total) by mouth every 8 (eight) hours as needed for muscle spasms. 30 tablet 0   No facility-administered medications prior to visit.     Allergies:  Allergies  Allergen Reactions  . Contrast Media [Iodinated Diagnostic Agents] Hives and Other (See Comments)    Per pt strong burning sensation starting in chest radiating outward   . Clonidine Derivatives Other (See Comments)    Throat dry  . Statins Other (See Comments)    "bones hurt"  . Sulfa Antibiotics Diarrhea    Tremors   . Celebrex [Celecoxib] Rash  . Isosorbide Nitrate Itching and Rash  . Other Itching and Rash    Plastic and paper tape and heart monitor pads  . Tape Itching and Rash    Red Where applied and will spread      Review of Systems: See HPI for  pertinent ROS. All other ROS negative.    Physical Exam: Blood pressure (!) 122/50, pulse 71, temperature 97.6 F (36.4 C), temperature source Oral, resp. rate 14, height 5\' 5"  (1.651 m), weight 88.2 kg (194 lb 6.4 oz), SpO2 94 %., Body mass index is 32.35 kg/m. General: WF.  Appears in no acute distress. Neck: Supple. No thyromegaly. No lymphadenopathy. Lungs: Clear bilaterally to auscultation without wheezes, rales, or rhonchi. Breathing is unlabored. Heart: Regular rhythm. No murmurs, rubs, or gallops. Msk:  Strength and tone normal for age. Extremities/Skin:  Lateral / Ulnar Aspect of Right Wrist---- Approx 1 cm diameter circular area c/w primary burn site. There is additional 1.5 cm area of erythema adjacent to this, c/w surrounding cellulitis. Neuro: Alert and oriented X 3. Moves all extremities spontaneously. Gait is normal. CNII-XII grossly in tact. Psych:  Responds to questions appropriately with a normal affect.     ASSESSMENT AND PLAN:  83 y.o. year old female with  1. Cellulitis of right upper extremity She has sulfa allergy and I bacitracin ointment samples have sulfa.  Therefore she is to not apply topical ointment to avoid reaction with the sulfa. Is to take the oral Keflex as directed.  She is to monitor site.  If site worsens or does not heal then follow-up. - cephALEXin (KEFLEX) 500 MG capsule; Take 1 capsule (500 mg total) by mouth 4 (four) times daily for 10 days.  Dispense: 40 capsule; Refill: 0   Signed, [address] [place], [place], [facility] 03/30/2018 12:11 PM

## 2018-04-06 ENCOUNTER — Ambulatory Visit: Payer: Medicare Other | Admitting: Adult Health

## 2018-04-06 ENCOUNTER — Encounter: Payer: Self-pay | Admitting: Adult Health

## 2018-04-06 VITALS — BP 138/74 | HR 66 | Wt 198.4 lb

## 2018-04-06 DIAGNOSIS — I482 Chronic atrial fibrillation: Secondary | ICD-10-CM

## 2018-04-06 DIAGNOSIS — G459 Transient cerebral ischemic attack, unspecified: Secondary | ICD-10-CM

## 2018-04-06 DIAGNOSIS — Z7901 Long term (current) use of anticoagulants: Secondary | ICD-10-CM

## 2018-04-06 DIAGNOSIS — I10 Essential (primary) hypertension: Secondary | ICD-10-CM

## 2018-04-06 DIAGNOSIS — I4821 Permanent atrial fibrillation: Secondary | ICD-10-CM

## 2018-04-06 NOTE — Patient Instructions (Signed)
Continue Eliquis (apixaban) daily for secondary stroke prevention  Continue to follow up with PCP regarding cholesterol and blood pressure management   Continue to stay active and maintain healthy diet  Continue to monitor blood pressure at home  Maintain strict control of hypertension with blood pressure goal below 130/90, diabetes with hemoglobin A1c goal below 6.5% and cholesterol with LDL cholesterol (bad cholesterol) goal below 70 mg/dL. I also advised the patient to eat a healthy diet with plenty of whole grains, cereals, fruits and vegetables, exercise regularly and maintain ideal body weight.  Followup in the future with me as needed or call earlier if needed        Thank you for coming to see [place] at [facility]. I hope we have been able to provide you high quality care today.  You Donlan receive a patient satisfaction survey over the next few weeks. We would appreciate your feedback and comments so that we Russon continue to improve ourselves and the health of our patients.

## 2018-04-06 NOTE — Progress Notes (Signed)
NEUROLOGY CLINIC NEW PATIENT NOTE  NAME: [name] DOB: 1935/03/14 REFERRING PHYSICIAN: Rennis Golden  I saw [name] as a new consult in the neurovascular clinic today regarding  Chief Complaint  Patient presents with  . Follow-up    TIA follow up roomm 9 pt  is alone son was in the waiting room  .  HPI: [name] is a 83 y.o. female with PMH of HTN, HLD, CAD/NSTEMI, CHF, afib on coumadin, extensive hx of diverticulitis and adnexal mass who presents as a new patient for TIA.   She has had extensive hx of right lower quadrant pain. She has hx of hemicolectomy following with GI. 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 She is scheduled to have colonoscopy next Monday and then sigmoid colectomy next Tuesday. She was scheduled to stop coumadin 5 days prior to the surgery.   On 12/25/17, she was making biscuit. Suddenly, she was not feeling right, felt sick in stomach. She had to lie down. Strange feeling went away in 10-20 min. She then got up, but started to have N/V and ongoing for the next 2 hours. When she started walking at home, she felt she walked leaning to her side ways. The imbalance lasted about 4-5 hours and then resolved. Denies any double vision, speech difficulty, weakness or swallowing difficulty. Never had this happened before. CT head unremarkable. Pt was referred here for further evaluation.  She has hx of permanent afib, on coumadin at home. However, her INR fluctuate, on record, her last INR 10 days ago 1.9, 2 months ago 2.16, and last year all subtherapeutic. She follows with Dr. [name] in cardiology. BP today 152/65 in clinic.   04/06/18 UPDATE: Surgery was initially scheduled on April 2019 for left colectomy but this was canceled due to needing further work-up for possible stroke.  Prior to having carotid ultrasound  performed, Dr. Erlinda Hong recommended stopping Coumadin and start Eliquis 5 mg twice daily.  Carotid ultrasound was performed which was negative for artery stenosis.  It was recommended to talk to her surgeon to reschedule surgery and cleared from neurology standpoint to stop Eliquis 48 hours prior to surgery and resume after the surgery.  According to Dr. Serita Grit notes from recent visits, her left side pain resolved and it was decided by Dr. Marlou Starks and the patient that scheduling a left colectomy was not indicated at that time as she symptomatically improved. Patient returns today for routine follow up.  Overall patient states she continues to do well.  She has been compliant with Eliquis and denies bleeding or bruising.  Blood pressure today satisfactory 138/74.  PCP follows for cholesterol management but patient has decided to adjust diet versus doing statin or PSCK9 injection.  When asked patient if she is maintaining a healthy diet to help her cholesterol levels, she states "I been eating the same things I but we say".  Educated on importance of ensuring cholesterol levels stay well and LDL less than 70.  Denies new or worsening stroke/TIA symptoms.    Past Medical History:  Diagnosis Date  . Allergy    rhinitis  . Aortic stenosis    mild by echo 06/2017  . Arthritis   . Bradycardia    a. 10/2017 -> beta blocker cut back due to HR 39.  . Breast cancer ([place]) 01/06/2012  .  Cancer [facility])    right colon and left breast  . Chronic diastolic CHF (congestive heart failure) ([place])   . Colon cancer ([place]) 01/06/2012  . COPD (chronic obstructive pulmonary disease) ([place])    pt. denies  . Coronary artery disease 2006   a.  NSTEMI in 2016, cath showed 15% prox-mid RCA, 20% prox LAD, EF 25-35% by cath and 35-40% -> felt due to Takotsubo cardiomyopathy.  . Dilated aortic root ([place])    87mmHg by echo 06/2017  . Diverticulosis   . Dyspnea   . Edema extremities   . GERD (gastroesophageal reflux disease)   . Hernia     . Hiatal hernia    denies  . Hyperlipidemia   . Hypertension   . Mild aortic stenosis    echo 11/2015 but not noted on echo 06/2016  . Osteopenia   . Permanent atrial fibrillation ([facility])    chronic atrial fibrillation  . Pneumonia    hx child  . Pulmonary HTN ([place])    a. moderate to severe PASP 60mmHg echo 11/2015 - now 41mmHg by echo 06/2017. CTA chest in 11/16 with no PE. PFTs in 7/15 with mild obstructive lung disease. She had a negative sleep study in 2017. b. Felt due to left sided HF.  Marland Kitchen Stroke ([place])   . Takotsubo syndrome 07/29/2015   a. EF 35-40% by echo; akinesis of mid-apical anteroseptal and apical myocardium.  EF now normalized on echo 11/2015   Past Surgical History:  Procedure Laterality Date  . APPENDECTOMY    . BREAST SURGERY     lumpectomy left  . CARDIAC CATHETERIZATION    . CARDIAC CATHETERIZATION N/A 07/28/2015   Procedure: Left Heart Cath and Coronary Angiography;  Surgeon: Peter M Martinique, MD;  Location: [facility];  Service: Cardiovascular;  Laterality: N/A;  . CHOLECYSTECTOMY    . COLECTOMY     right side  . EXCISION OF ACCESSORY NIPPLE Bilateral 05/30/2013   Procedure: BILATERAL NIPPLE BIOPSY;  Surgeon: Merrie Roof, MD;  Location: [facility];  Service: General;  Laterality: Bilateral;  . EYE SURGERY Bilateral 14   cataracts  . IR RADIOLOGIST EVAL & MGMT  07/26/2017  . SPLIT NIGHT STUDY  02/02/2016       Family History  Problem Relation Age of Onset  . Heart disease Mother   . Heart attack Mother   . Cancer Sister        stomach and colon  . Heart disease Brother 80  . Hypertension Father   . Cancer Sister   . Stroke Neg Hx    Current Outpatient Medications  Medication Sig Dispense Refill  . acetaminophen (TYLENOL) 500 MG tablet Take 500-1,000 mg by mouth every 6 (six) hours as needed for headache (pain).    Marland Kitchen amLODipine (NORVASC) 5 MG tablet Take 1 tablet (5 mg total) by mouth daily. 30 tablet 3  . apixaban (ELIQUIS) 5 MG TABS tablet Take  1 tablet (5 mg total) by mouth 2 (two) times daily. 60 tablet 3  . augmented betamethasone dipropionate (DIPROLENE-AF) 0.05 % ointment Apply 1 application topically 2 (two) times daily as needed (rash/dry skin).     . cetirizine (ZYRTEC) 10 MG tablet Take 10 mg by mouth daily as needed for allergies.    . clonazePAM (KLONOPIN) 0.5 MG tablet TAKE 1 TABLET BY MOUTH TWICE DAILY AS NEEDED FOR ANXIETY (Patient taking differently: TAKE 1 TABLET (0.5 MG) BY MOUTH TWICE DAILY AS NEEDED FOR ANXIETY) 60 tablet  2  . cloNIDine (CATAPRES) 0.1 MG tablet Take 1 tablet (0.1 mg total) by mouth 2 (two) times daily. 180 tablet 2  . fexofenadine (ALLEGRA ALLERGY) 180 MG tablet Take 1 tablet (180 mg total) by mouth daily. 30 tablet 3  . Guaifenesin (MUCINEX MAXIMUM STRENGTH) 1200 MG TB12 Take 1,200 mg by mouth daily as needed (congestion).    Marland Kitchen losartan (COZAAR) 100 MG tablet Take 1 tablet (100 mg total) by mouth daily. 30 tablet 3  . metoprolol succinate (TOPROL XL) 25 MG 24 hr tablet Take 1 tablet (25 mg total) by mouth daily. 90 tablet 3  . mirtazapine (REMERON) 15 MG tablet TAKE 1 TABLET BY MOUTH AT BEDTIME 90 tablet 0  . nitroGLYCERIN (NITROSTAT) 0.4 MG SL tablet PLACE ONE TABLET UNDER THE TONGUE EVERY FIVE MINUTES AS NEEDED FOR CHEST PAIN. [place] REPEAT FOR THREE DOSES. 30 tablet 2  . omeprazole (PRILOSEC OTC) 20 MG tablet Take 20 mg by mouth daily with supper.    . ondansetron (ZOFRAN ODT) 4 MG disintegrating tablet Take 1 tablet (4 mg total) by mouth every 8 (eight) hours as needed for nausea or vomiting. 20 tablet 0  . OVER THE COUNTER MEDICATION Place 1 drop into both eyes daily as needed (dry eyes). Over the counter lubricating eye drops    . spironolactone (ALDACTONE) 25 MG tablet TAKE 1/2 (ONE-HALF) TABLET BY MOUTH ONCE DAILY 45 tablet 3  . torsemide (DEMADEX) 20 MG tablet Take 3 tablets (60 mg total) by mouth daily. 90 tablet 11   No current facility-administered medications for this visit.    Allergies   Allergen Reactions  . Contrast Media [Iodinated Diagnostic Agents] Hives and Other (See Comments)    Per pt strong burning sensation starting in chest radiating outward   . Clonidine Derivatives Other (See Comments)    Throat dry  . Statins Other (See Comments)    "bones hurt"  . Sulfa Antibiotics Diarrhea    Tremors   . Celebrex [Celecoxib] Rash  . Isosorbide Nitrate Itching and Rash  . Other Itching and Rash    Plastic and paper tape and heart monitor pads  . Tape Itching and Rash    Red Where applied and will spread   Social History   Socioeconomic History  . Marital status: Married    Spouse name: Not on file  . Number of children: Not on file  . Years of education: Not on file  . Highest education level: Not on file  Occupational History  . Not on file  Social Needs  . Financial resource strain: Not on file  . Food insecurity:    Worry: Not on file    Inability: Not on file  . Transportation needs:    Medical: Not on file    Non-medical: Not on file  Tobacco Use  . Smoking status: Never Smoker  . Smokeless tobacco: Never Used  Substance and Sexual Activity  . Alcohol use: No  . Drug use: No  . Sexual activity: Not Currently  Lifestyle  . Physical activity:    Days per week: Not on file    Minutes per session: Not on file  . Stress: Not on file  Relationships  . Social connections:    Talks on phone: Not on file    Gets together: Not on file    Attends religious service: Not on file    Active member of club or organization: Not on file    Attends meetings of clubs or  organizations: Not on file    Relationship status: Not on file  . Intimate partner violence:    Fear of current or ex partner: Not on file    Emotionally abused: Not on file    Physically abused: Not on file    Forced sexual activity: Not on file  Other Topics Concern  . Not on file  Social History Narrative  . Not on file    Review of Systems Full 14 system review of systems  performed and notable only for those listed, all others are neg:  No complaints  Physical Exam  Vitals:   04/06/18 1408  BP: 138/74  Pulse: 66    General - Well nourished, well developed, in no apparent distress.  Ophthalmologic - fundi not visualized due to noncooperation .  Cardiovascular - irregularly irregular heart rate and rhythm   Mental Status -  Level of arousal and orientation to time, place, and person were intact. Language including expression, naming, repetition, comprehension, reading, and writing was assessed and found intact. Fund of Knowledge was assessed and was intact.  Cranial Nerves II - XII - II - Visual field intact OU. III, IV, VI - Extraocular movements intact. V - Facial sensation intact bilaterally. VII - Facial movement intact bilaterally. VIII - Hearing & vestibular intact bilaterally. X - Palate elevates symmetrically. XI - Chin turning & shoulder shrug intact bilaterally. XII - Tongue protrusion intact.  Motor Strength - The patient's strength was normal in all extremities and pronator drift was absent.  Bulk was normal and fasciculations were absent.   Motor Tone - Muscle tone was assessed at the neck and appendages and was normal.  Reflexes - The patient's reflexes were normal in all extremities and she had no pathological reflexes.  Sensory - Light touch, temperature/pinprick, vibration and proprioception, and Romberg testing were assessed and were normal.    Coordination - The patient had normal movements in the hands with no ataxia or dysmetria.  Tremor was absent.  Gait and Station - slight difficulty getting out of chair, slow cautions gait, small stride. Mild stooped posturing but does not use assistive device.     Imaging  I have personally reviewed the radiological images below and agree with the radiology interpretations.  Ct Head Wo Contrast  Result Date: 12/25/2017 CLINICAL DATA:  Dizziness, vomiting EXAM: CT HEAD WITHOUT  CONTRAST TECHNIQUE: Contiguous axial images were obtained from the base of the skull through the vertex without intravenous contrast. COMPARISON:  None. FINDINGS: Brain: There is atrophy and chronic small vessel disease changes. No acute intracranial abnormality. Specifically, no hemorrhage, hydrocephalus, mass lesion, acute infarction, or significant intracranial injury. Vascular: No hyperdense vessel or unexpected calcification. Skull: No acute calvarial abnormality. Sinuses/Orbits: Visualized paranasal sinuses and mastoids clear. Orbital soft tissues unremarkable. Other: None IMPRESSION: No acute intracranial abnormality. Atrophy, chronic microvascular disease. Electronically Signed   By: Rolm Baptise M.D.   On: 12/25/2017 21:40   Lab Review Component     Latest Ref Rng & Units 01/16/2016 03/15/2016 07/08/2016 11/29/2016  Cholesterol     0 - 200 mg/dL 163 170    Triglycerides     0 - 149 mg/dL 88 113  84  HDL Cholesterol     >39 mg/dL 53 48  59  Total CHOL/HDL Ratio     0.0 - 4.4 ratio units 3.1 3.5  2.7  VLDL     0 - 40 mg/dL 18 23    LDL (calc)  0 - 99 mg/dL 92 99  81  Cholesterol, Total     100 - 199 mg/dL    157  VLDL Cholesterol Cal     5 - 40 mg/dL    17  TSH     mIU/L   2.20      Assessment and Plan:   In summary, [name] is a 83 y.o. female with PMH of HTN, HLD, CAD/NSTEMI, CHF, afib on coumadin, extensive hx of diverticulitis and adnexal mass who presents as a new patient for TIA with N/V and gait instability. Due to hx of Afib on coumadin with subtherapeutic INR, concerning for TIA due to inadequate anticoagulation. Will recommend to switch to eliquis. However, pt is scheduled to have colonoscopy next Monday and then sigmoid colectomy next Tuesday, and she needs to stop coumadin 5 days prior to the surgery as instructed. Will initiate eliquis after her surgery once hemodynamically stable. Will do CUS to finish stroke work up. Do not think MRI necessary since not change  treatment plan.  Patient returns today for follow-up visit and overall is doing well.  -Continue Eliquis 5 mg twice daily for secondary stroke prevention and atrial fibrillation management  -f/u with PCP in regards to HTN and HLD monitoring and management - follow up with cardiology Dr. [name] -Advised to ensure she is maintaining a healthy diet and staying active - Follow up with your primary care physician for stroke risk factor modification. Recommend maintain blood pressure goal <130/80, diabetes with hemoglobin A1c goal below 7.0% and lipids with LDL cholesterol goal below 70 mg/dL.   Patient will return as needed as she is stable from a stroke standpoint or call earlier if needed  Greater than 50% of time during this 25 minute visit was spent on counseling,explanation of diagnosis of TIA2, reviewing risk factor management of HLD, HTN, AF, CAD, planning of further management, discussion with patient and family and coordination of care   Venancio Poisson, AGNP-BC  [facility] [address] [place], [place] [address]  Phone [phone] Fax [phone] Note: This document was prepared with digital dictation and possible smart phrase technology. Any transcriptional errors that result from this process are unintentional.

## 2018-04-17 ENCOUNTER — Encounter: Payer: Self-pay | Admitting: Physician Assistant

## 2018-04-17 ENCOUNTER — Ambulatory Visit: Payer: Medicare Other | Admitting: Physician Assistant

## 2018-04-17 VITALS — BP 136/62 | HR 72 | Temp 98.1°F | Resp 14 | Ht 65.0 in | Wt 198.2 lb

## 2018-04-17 DIAGNOSIS — N390 Urinary tract infection, site not specified: Secondary | ICD-10-CM | POA: Diagnosis not present

## 2018-04-17 DIAGNOSIS — R300 Dysuria: Secondary | ICD-10-CM | POA: Diagnosis not present

## 2018-04-17 DIAGNOSIS — R319 Hematuria, unspecified: Secondary | ICD-10-CM

## 2018-04-17 LAB — URINALYSIS, ROUTINE W REFLEX MICROSCOPIC
Bilirubin Urine: NEGATIVE
Glucose, UA: NEGATIVE
KETONES UR: NEGATIVE
NITRITE: NEGATIVE
Specific Gravity, Urine: 1.01 (ref 1.001–1.03)
WBC, UA: >=60 /HPF — AB (ref 0–5)
pH: 5.5 (ref 5.0–8.0)

## 2018-04-17 LAB — MICROSCOPIC MESSAGE

## 2018-04-17 MED ORDER — CIPROFLOXACIN HCL 500 MG PO TABS: 500.0000 mg | ORAL_TABLET | Freq: Two times a day (BID) | ORAL | 0 refills | Status: AC

## 2018-04-17 MED ORDER — FLUCONAZOLE 150 MG PO TABS: ORAL_TABLET | ORAL | 0 refills | Status: DC

## 2018-04-17 NOTE — Progress Notes (Signed)
Patient ID: [name] MRN: [id number], DOB: 05/11/1935, 83 y.o. Date of Encounter: 04/17/2018, 11:01 AM    Chief Complaint:  Chief Complaint  Patient presents with  . Dysuria    symptoms last 3-4 days      HPI: 83 y.o. year old female presents with above.   Reports that she is having significant burning when she urinates. Feels a little twinge of pressure in her suprapubic region when she urinates.  Otherwise is having no abdominal pain.  Has had no fevers or chills.  No pain at the costophrenic angle area bilaterally.     Home Meds:   Outpatient Medications Prior to Visit  Medication Sig Dispense Refill  . acetaminophen (TYLENOL) 500 MG tablet Take 500-1,000 mg by mouth every 6 (six) hours as needed for headache (pain).    Marland Kitchen amLODipine (NORVASC) 5 MG tablet Take 1 tablet (5 mg total) by mouth daily. 30 tablet 3  . apixaban (ELIQUIS) 5 MG TABS tablet Take 1 tablet (5 mg total) by mouth 2 (two) times daily. 60 tablet 3  . augmented betamethasone dipropionate (DIPROLENE-AF) 0.05 % ointment Apply 1 application topically 2 (two) times daily as needed (rash/dry skin).     . cetirizine (ZYRTEC) 10 MG tablet Take 10 mg by mouth daily as needed for allergies.    . clonazePAM (KLONOPIN) 0.5 MG tablet TAKE 1 TABLET BY MOUTH TWICE DAILY AS NEEDED FOR ANXIETY (Patient taking differently: TAKE 1 TABLET (0.5 MG) BY MOUTH TWICE DAILY AS NEEDED FOR ANXIETY) 60 tablet 2  . cloNIDine (CATAPRES) 0.1 MG tablet Take 1 tablet (0.1 mg total) by mouth 2 (two) times daily. 180 tablet 2  . fexofenadine (ALLEGRA ALLERGY) 180 MG tablet Take 1 tablet (180 mg total) by mouth daily. 30 tablet 3  . Guaifenesin (MUCINEX MAXIMUM STRENGTH) 1200 MG TB12 Take 1,200 mg by mouth daily as needed (congestion).    Marland Kitchen losartan (COZAAR) 100 MG tablet Take 1 tablet (100 mg total) by mouth daily. 30 tablet 3  . metoprolol succinate (TOPROL XL) 25 MG 24 hr tablet Take 1 tablet (25 mg total) by mouth daily. 90 tablet 3  .  mirtazapine (REMERON) 15 MG tablet TAKE 1 TABLET BY MOUTH AT BEDTIME 90 tablet 0  . nitroGLYCERIN (NITROSTAT) 0.4 MG SL tablet PLACE ONE TABLET UNDER THE TONGUE EVERY FIVE MINUTES AS NEEDED FOR CHEST PAIN. [place] REPEAT FOR THREE DOSES. 30 tablet 2  . omeprazole (PRILOSEC OTC) 20 MG tablet Take 20 mg by mouth daily with supper.    . ondansetron (ZOFRAN ODT) 4 MG disintegrating tablet Take 1 tablet (4 mg total) by mouth every 8 (eight) hours as needed for nausea or vomiting. 20 tablet 0  . OVER THE COUNTER MEDICATION Place 1 drop into both eyes daily as needed (dry eyes). Over the counter lubricating eye drops    . spironolactone (ALDACTONE) 25 MG tablet TAKE 1/2 (ONE-HALF) TABLET BY MOUTH ONCE DAILY 45 tablet 3  . torsemide (DEMADEX) 20 MG tablet Take 3 tablets (60 mg total) by mouth daily. 90 tablet 11   No facility-administered medications prior to visit.     Allergies:  Allergies  Allergen Reactions  . Contrast Media [Iodinated Diagnostic Agents] Hives and Other (See Comments)    Per pt strong burning sensation starting in chest radiating outward   . Clonidine Derivatives Other (See Comments)    Throat dry  . Statins Other (See Comments)    "bones hurt"  . Sulfa Antibiotics Diarrhea  Tremors   . Celebrex [Celecoxib] Rash  . Isosorbide Nitrate Itching and Rash  . Other Itching and Rash    Plastic and paper tape and heart monitor pads  . Tape Itching and Rash    Red Where applied and will spread      Review of Systems: See HPI for pertinent ROS. All other ROS negative.    Physical Exam: Blood pressure 136/62, pulse 72, temperature 98.1 F (36.7 C), temperature source Oral, resp. rate 14, height 5\' 5"  (1.651 m), weight 89.9 kg, SpO2 96 %., Body mass index is 32.98 kg/m. General:  WF. Appears in no acute distress. Neck: Supple. No thyromegaly. No lymphadenopathy. Lungs: Clear bilaterally to auscultation without wheezes, rales, or rhonchi. Breathing is unlabored. Heart: Regular  rhythm. No murmurs, rubs, or gallops. Abdomen: Soft, non-tender, non-distended with normoactive bowel sounds. No hepatomegaly. No rebound/guarding. No obvious abdominal masses. Msk:  Strength and tone normal for age.  No tenderness with percussion to costophrenic angle bilaterally. Extremities/Skin: Warm and dry.  Neuro: Alert and oriented X 3. Moves all extremities spontaneously. Gait is normal. CNII-XII grossly in tact. Psych:  Responds to questions appropriately with a normal affect.     ASSESSMENT AND PLAN:  83 y.o. year old female with   1. Urinary tract infection with hematuria, site unspecified Urinalysis shows 3+ leukocyte and greater than 60 WBC.  Consistent with UTI.  Type antibiotic she is allergic to is sulfa.  Therefore will treat with Cipro.  Take this as directed.  Follow-up if symptoms do not resolve.  She is concerned that she will also get vaginal infection from antibiotic.  I did review with the technician in the lab microscopic exam.  She states that she just sees tons of white cells but no clue cells and no yeast.  She is to follow-up if symptoms worsen or do not resolve with this completion. - ciprofloxacin (CIPRO) 500 MG tablet; Take 1 tablet (500 mg total) by mouth 2 (two) times daily for 7 days.  Dispense: 14 tablet; Refill: 0 - fluconazole (DIFLUCAN) 150 MG tablet; Take one dose today. Repeat in 2 days if needed.  Dispense: 2 tablet; Refill: 0  2. Dysuria - Urinalysis, Routine w reflex microscopic - fluconazole (DIFLUCAN) 150 MG tablet; Take one dose today. Repeat in 2 days if needed.  Dispense: 2 tablet; Refill: 0   Signed, [address] [place], [place], [facility] 04/17/2018 11:01 AM

## 2018-04-20 NOTE — Progress Notes (Signed)
I agree with the above plan 

## 2018-05-05 ENCOUNTER — Telehealth: Payer: Self-pay | Admitting: Cardiology

## 2018-05-05 NOTE — Telephone Encounter (Signed)
New Message    *STAT* If patient is at the pharmacy, call can be transferred to refill team.   1. Which medications need to be refilled? (please list name of each medication and dose if known) apixaban (ELIQUIS) 5 MG TABS tablet  2. Which pharmacy/location (including street and city if local pharmacy) is medication to be sent to? [place], [place]  3. Do they need a 30 day or 90 day supply? [place]

## 2018-05-08 ENCOUNTER — Other Ambulatory Visit: Payer: Self-pay | Admitting: Physician Assistant

## 2018-05-08 ENCOUNTER — Telehealth: Payer: Self-pay | Admitting: Diagnostic Neuroimaging

## 2018-05-08 DIAGNOSIS — G4700 Insomnia, unspecified: Secondary | ICD-10-CM

## 2018-05-08 MED ORDER — APIXABAN 5 MG PO TABS: 5.0000 mg | ORAL_TABLET | Freq: Two times a day (BID) | ORAL | 0 refills | Status: DC

## 2018-05-08 NOTE — Telephone Encounter (Signed)
Patient about to run out of eliquis today.   I went ahead and refilled for 1 month. Will request cardiology to take over refills for future.   Meds ordered this encounter  Medications  . apixaban (ELIQUIS) 5 MG TABS tablet    Sig: Take 1 tablet (5 mg total) by mouth 2 (two) times daily.    Dispense:  60 tablet    Refill:  0    Future refills per Dr. Ashok Norris (cardiology)   Penni Bombard, MD 04/11/3816, 71:16 PM Certified in Neurology, Neurophysiology and Neuroimaging  [facility] [address] [place], [place] [address] [phone]

## 2018-05-09 ENCOUNTER — Other Ambulatory Visit: Payer: Self-pay

## 2018-05-09 ENCOUNTER — Other Ambulatory Visit: Payer: Self-pay | Admitting: *Deleted

## 2018-05-09 ENCOUNTER — Other Ambulatory Visit: Payer: Self-pay | Admitting: Physician Assistant

## 2018-05-09 DIAGNOSIS — G4700 Insomnia, unspecified: Secondary | ICD-10-CM

## 2018-05-09 MED ORDER — APIXABAN 5 MG PO TABS: 5.0000 mg | ORAL_TABLET | Freq: Two times a day (BID) | ORAL | 1 refills | Status: DC

## 2018-05-09 NOTE — Telephone Encounter (Signed)
FYI

## 2018-05-09 NOTE — Progress Notes (Unsigned)
This encounter was created in error - please disregard.

## 2018-05-09 NOTE — Telephone Encounter (Signed)
Eliquis refilled.  

## 2018-05-09 NOTE — Telephone Encounter (Signed)
Noted! Thank you

## 2018-05-09 NOTE — Telephone Encounter (Signed)
Will send in 90 day supply of Eliquis 5mg  BID.

## 2018-06-07 ENCOUNTER — Ambulatory Visit: Payer: Medicare Other | Admitting: Physician Assistant

## 2018-06-07 ENCOUNTER — Encounter: Payer: Self-pay | Admitting: Physician Assistant

## 2018-06-07 VITALS — BP 138/60 | HR 56 | Temp 97.9°F | Resp 14 | Ht 65.0 in | Wt 199.6 lb

## 2018-06-07 DIAGNOSIS — R39198 Other difficulties with micturition: Secondary | ICD-10-CM

## 2018-06-07 DIAGNOSIS — N390 Urinary tract infection, site not specified: Secondary | ICD-10-CM

## 2018-06-07 DIAGNOSIS — R319 Hematuria, unspecified: Secondary | ICD-10-CM

## 2018-06-07 LAB — URINALYSIS, ROUTINE W REFLEX MICROSCOPIC
BILIRUBIN URINE: NEGATIVE
GLUCOSE, UA: NEGATIVE
Ketones, ur: NEGATIVE
Nitrite: NEGATIVE
PROTEIN: NEGATIVE
Specific Gravity, Urine: 1.003 (ref 1.001–1.03)
pH: 5.5 (ref 5.0–8.0)

## 2018-06-07 LAB — MICROSCOPIC MESSAGE

## 2018-06-07 MED ORDER — CIPROFLOXACIN HCL 500 MG PO TABS: 500.0000 mg | ORAL_TABLET | Freq: Two times a day (BID) | ORAL | 0 refills | Status: AC

## 2018-06-07 NOTE — Progress Notes (Signed)
Patient ID: [name] MRN: [id number], DOB: 20-Mar-1935, 83 y.o. Date of Encounter: 06/07/2018, 9:27 AM    Chief Complaint:  Chief Complaint  Patient presents with  . Dysuria     HPI: 83 y.o. year old female presents with dysuria.  She reports that she has recently been feeling burning when she urinates. Therefore came in for evaluation. Has had no fevers or chills.  No pain in her back.  No pelvic pain.     Home Meds:   Outpatient Medications Prior to Visit  Medication Sig Dispense Refill  . acetaminophen (TYLENOL) 500 MG tablet Take 500-1,000 mg by mouth every 6 (six) hours as needed for headache (pain).    Marland Kitchen amLODipine (NORVASC) 5 MG tablet Take 1 tablet (5 mg total) by mouth daily. 30 tablet 3  . apixaban (ELIQUIS) 5 MG TABS tablet Take 1 tablet (5 mg total) by mouth 2 (two) times daily. 180 tablet 1  . augmented betamethasone dipropionate (DIPROLENE-AF) 0.05 % ointment Apply 1 application topically 2 (two) times daily as needed (rash/dry skin).     . cetirizine (ZYRTEC) 10 MG tablet Take 10 mg by mouth daily as needed for allergies.    . clonazePAM (KLONOPIN) 0.5 MG tablet TAKE 1 TABLET BY MOUTH TWICE DAILY AS NEEDED FOR ANXIETY (Patient taking differently: TAKE 1 TABLET (0.5 MG) BY MOUTH TWICE DAILY AS NEEDED FOR ANXIETY) 60 tablet 2  . cloNIDine (CATAPRES) 0.1 MG tablet Take 1 tablet (0.1 mg total) by mouth 2 (two) times daily. 180 tablet 2  . fexofenadine (ALLEGRA ALLERGY) 180 MG tablet Take 1 tablet (180 mg total) by mouth daily. 30 tablet 3  . Guaifenesin (MUCINEX MAXIMUM STRENGTH) 1200 MG TB12 Take 1,200 mg by mouth daily as needed (congestion).    Marland Kitchen losartan (COZAAR) 100 MG tablet Take 1 tablet (100 mg total) by mouth daily. 30 tablet 3  . metoprolol succinate (TOPROL XL) 25 MG 24 hr tablet Take 1 tablet (25 mg total) by mouth daily. 90 tablet 3  . mirtazapine (REMERON) 15 MG tablet TAKE 1 TABLET BY MOUTH AT BEDTIME 90 tablet 0  . nitroGLYCERIN (NITROSTAT) 0.4 MG SL  tablet PLACE ONE TABLET UNDER THE TONGUE EVERY FIVE MINUTES AS NEEDED FOR CHEST PAIN. [place] REPEAT FOR THREE DOSES. 30 tablet 2  . omeprazole (PRILOSEC OTC) 20 MG tablet Take 20 mg by mouth daily with supper.    . ondansetron (ZOFRAN ODT) 4 MG disintegrating tablet Take 1 tablet (4 mg total) by mouth every 8 (eight) hours as needed for nausea or vomiting. 20 tablet 0  . OVER THE COUNTER MEDICATION Place 1 drop into both eyes daily as needed (dry eyes). Over the counter lubricating eye drops    . spironolactone (ALDACTONE) 25 MG tablet TAKE 1/2 (ONE-HALF) TABLET BY MOUTH ONCE DAILY 45 tablet 3  . torsemide (DEMADEX) 20 MG tablet Take 3 tablets (60 mg total) by mouth daily. 90 tablet 11  . fluconazole (DIFLUCAN) 150 MG tablet Take one dose today. Repeat in 2 days if needed. 2 tablet 0  . mirtazapine (REMERON) 15 MG tablet TAKE 1 TABLET BY MOUTH AT BEDTIME 90 tablet 0   No facility-administered medications prior to visit.     Allergies:  Allergies  Allergen Reactions  . Contrast Media [Iodinated Diagnostic Agents] Hives and Other (See Comments)    Per pt strong burning sensation starting in chest radiating outward   . Clonidine Derivatives Other (See Comments)    Throat dry  .  Statins Other (See Comments)    "bones hurt"  . Sulfa Antibiotics Diarrhea    Tremors   . Celebrex [Celecoxib] Rash  . Isosorbide Nitrate Itching and Rash  . Other Itching and Rash    Plastic and paper tape and heart monitor pads  . Tape Itching and Rash    Red Where applied and will spread      Review of Systems: See HPI for pertinent ROS. All other ROS negative.    Physical Exam: Blood pressure 138/60, pulse (!) 56, temperature 97.9 F (36.6 C), temperature source Oral, resp. rate 14, height 5\' 5"  (1.651 m), weight 90.5 kg, SpO2 95 %., Body mass index is 33.22 kg/m. General:  Obese WF. Appears in no acute distress. Neck: Supple. No thyromegaly. No lymphadenopathy. Lungs: Clear bilaterally to auscultation  without wheezes, rales, or rhonchi. Breathing is unlabored. Heart: Regular rhythm.  Abdomen: Soft, non-tender, non-distended with normoactive bowel sounds. No hepatomegaly. No rebound/guarding. No obvious abdominal masses. Msk:  Strength and tone normal for age.  No tenderness with percussion to costophrenic angles bilaterally. Extremities/Skin: Warm and dry.  Neuro: Alert and oriented X 3. Moves all extremities spontaneously. Gait is normal. CNII-XII grossly in tact. Psych:  Responds to questions appropriately with a normal affect.     ASSESSMENT AND PLAN:  83 y.o. year old female with   1. Urinary tract infection with hematuria, site unspecified Will treat with Cipro.  Will send culture and follow-up those results as well. - Urine Culture - ciprofloxacin (CIPRO) 500 MG tablet; Take 1 tablet (500 mg total) by mouth 2 (two) times daily for 7 days.  Dispense: 14 tablet; Refill: 0  2. Difficulty urinating - Urinalysis, Routine w reflex microscopic  3. Hematuria of undiagnosed cause Urinalysis shows blood.  Will obtain a follow-up urine in 2 weeks to make sure blood resolves once infection is treated. - Urinalysis, Routine w reflex microscopic; Future   Signed, [place], [place], [facility] 06/07/2018 9:27 AM

## 2018-06-08 LAB — URINE CULTURE
MICRO NUMBER: 91183713
SPECIMEN QUALITY:: ADEQUATE

## 2018-06-11 DIAGNOSIS — S51811A Laceration without foreign body of right forearm, initial encounter: Secondary | ICD-10-CM | POA: Diagnosis not present

## 2018-06-13 ENCOUNTER — Telehealth: Payer: Self-pay | Admitting: Cardiology

## 2018-06-13 NOTE — Telephone Encounter (Signed)
New Message         Patient is calling today to let Dr. [name] know that she is taking Eliquis 5 mg and she is having problems with bleeding and bruising, she wants to know could this be coming from Eliquis?

## 2018-06-14 NOTE — Telephone Encounter (Signed)
Spoke with the patient, she is concerned about her bleeding. She had 5 skins tears on her body about a inch by inch size. The main skin tear occurred on Thursday and is still bleeding once she removes the bandage. [facility] and they advised the patient to keep the bandage on for 2 days to help. She said every time she removes the bandage that she bleeds from the site. Patient denies any fatigue or dizziness. Advised her that prolonged bleeding time is a side effect of Eliquis.  Patient requested a lower dose, sending to Dr. [name].

## 2018-06-14 NOTE — Telephone Encounter (Signed)
Although her age is > 89, her weight is > 60kg and creatinine < 1.5 and therefore needs to stay on Eliquis 5mg  BID.  Recommend she see her PCP if bleeding reoccurs when she removes her bandage.

## 2018-06-14 NOTE — Telephone Encounter (Signed)
Spoke with the patient about Dr. Landis Gandy recommendations, she accepted and expressed understanding.

## 2018-06-16 DIAGNOSIS — L0101 Non-bullous impetigo: Secondary | ICD-10-CM | POA: Diagnosis not present

## 2018-06-16 DIAGNOSIS — L249 Irritant contact dermatitis, unspecified cause: Secondary | ICD-10-CM | POA: Diagnosis not present

## 2018-06-16 DIAGNOSIS — L0100 Impetigo, unspecified: Secondary | ICD-10-CM | POA: Diagnosis not present

## 2018-06-19 ENCOUNTER — Telehealth: Payer: Self-pay | Admitting: Cardiology

## 2018-06-19 NOTE — Telephone Encounter (Signed)
New message:      Pt c/o swelling: STAT is pt has developed SOB within 24 hours  1) How much weight have you gained and in what time span? 2 wks  2) If swelling, where is the swelling located? Ankles/feet  3) Are you currently taking a fluid pill? No  4) Are you currently SOB? No  5) Do you have a log of your daily weights (if so, list)? No  6) Have you gained 3 pounds in a day or 5 pounds in a week?   7) Have you traveled recently? No

## 2018-06-19 NOTE — Telephone Encounter (Signed)
The patient has had swelling in her ankles and feet for 2-3 weeks. She is having difficulty putting on her shoes. Advised her to decrease the salt in her diet by cooking at home and not eating out. Informed her about the high salt content in canned soup and veggies. She stated she has been eating fast food for the past few days, but she states it was only one meal a day.   Sending to Dr. [name] for recommendations.

## 2018-06-19 NOTE — Telephone Encounter (Signed)
Please have her see her PCP this week

## 2018-06-21 NOTE — Telephone Encounter (Signed)
Advised the patient to go to her PCP this week, she accepted.

## 2018-06-22 ENCOUNTER — Other Ambulatory Visit: Payer: Self-pay

## 2018-06-22 ENCOUNTER — Ambulatory Visit: Payer: Medicare Other | Admitting: Family Medicine

## 2018-06-22 ENCOUNTER — Encounter: Payer: Self-pay | Admitting: Family Medicine

## 2018-06-22 VITALS — BP 132/62 | HR 86 | Temp 98.2°F | Resp 16 | Ht 65.0 in | Wt 200.0 lb

## 2018-06-22 DIAGNOSIS — L309 Dermatitis, unspecified: Secondary | ICD-10-CM | POA: Diagnosis not present

## 2018-06-22 DIAGNOSIS — R600 Localized edema: Secondary | ICD-10-CM

## 2018-06-22 LAB — CBC WITH DIFFERENTIAL/PLATELET
BASOS ABS: 53 {cells}/uL (ref 0–200)
Basophils Relative: 0.7 %
EOS ABS: 319 {cells}/uL (ref 15–500)
EOS PCT: 4.2 %
HEMATOCRIT: 33.8 % — AB (ref 35.0–45.0)
HEMOGLOBIN: 11.1 g/dL — AB (ref 11.7–15.5)
LYMPHS ABS: 2683 {cells}/uL (ref 850–3900)
MCH: 29 pg (ref 27.0–33.0)
MCHC: 32.8 g/dL (ref 32.0–36.0)
MCV: 88.3 fL (ref 80.0–100.0)
MPV: 10.1 fL (ref 7.5–12.5)
Monocytes Relative: 12.2 %
NEUTROS ABS: 3618 {cells}/uL (ref 1500–7800)
NEUTROS PCT: 47.6 %
Platelets: 262 10*3/uL (ref 140–400)
RBC: 3.83 10*6/uL (ref 3.80–5.10)
RDW: 13.3 % (ref 11.0–15.0)
Total Lymphocyte: 35.3 %
WBC mixed population: 927 cells/uL (ref 200–950)
WBC: 7.6 10*3/uL (ref 3.8–10.8)

## 2018-06-22 LAB — COMPLETE METABOLIC PANEL WITH GFR
AG Ratio: 1.3 (calc) (ref 1.0–2.5)
ALT: 8 U/L (ref 6–29)
AST: 16 U/L (ref 10–35)
Albumin: 4 g/dL (ref 3.6–5.1)
Alkaline phosphatase (APISO): 81 U/L (ref 33–130)
BUN/Creatinine Ratio: 21 (calc) (ref 6–22)
BUN: 23 mg/dL (ref 7–25)
CHLORIDE: 97 mmol/L — AB (ref 98–110)
CO2: 30 mmol/L (ref 20–32)
Calcium: 9.4 mg/dL (ref 8.6–10.4)
Creat: 1.11 mg/dL — ABNORMAL HIGH (ref 0.60–0.88)
GFR, EST AFRICAN AMERICAN: 54 mL/min/{1.73_m2} — AB (ref >=60)
GFR, EST NON AFRICAN AMERICAN: 47 mL/min/{1.73_m2} — AB (ref >=60)
GLUCOSE: 98 mg/dL (ref 65–99)
Globulin: 3.2 g/dL (calc) (ref 1.9–3.7)
Potassium: 3.9 mmol/L (ref 3.5–5.3)
Sodium: 138 mmol/L (ref 135–146)
TOTAL PROTEIN: 7.2 g/dL (ref 6.1–8.1)
Total Bilirubin: 0.5 mg/dL (ref 0.2–1.2)

## 2018-06-22 NOTE — Progress Notes (Signed)
Patient ID: [name], female    DOB: 1934/12/04, 83 y.o.   MRN: [id number]  PCP: Orlena Sheldon, PA-C  Chief Complaint  Patient presents with  . Edema    x weeks- swelling to B feet- has some irritation and drainaged from roken vessels on ankles- cards sent to PCP for tx    Subjective:   [name] is a 83 y.o. female, presents to clinic with CC of several weeks of swelling to bilateral extremities and feet with broken dry skin with "blood vessels" to her right ankle that is very painful and has some clear drainage and redness and crusting.  Was seen by cardiology and referred back to her PCP for evaluation of this. She has not noted any purulent drainage.  She denies bleeding, warmth, fever, chills, sweats.  Pain is moderate to severe, worse with any touching, is stinging and burning.  She had to get large tennis shoes to fit into. she is currently taking torsemide and spironolactone She went to get compression stocking before but was told she should not wear them because of "her high instep."   Patient Active Problem List   Diagnosis Date Noted  . TIA (transient ischemic attack) 01/06/2018  . Breast cancer ([place]) 09/13/2017  . Obesity, unspecified 09/13/2017  . Warfarin anticoagulation 09/13/2017  . Uterine mass 09/07/2017  . Adnexal mass 08/03/2017  . Pelvic abscess in female 07/07/2017  . Aortic stenosis   . Dyslipidemia, goal LDL below 70 11/28/2016  . Monitoring for long-term anticoagulant use 12/31/2015  . Chronic diastolic CHF (congestive heart failure) ([place]) 10/28/2015  . Insomnia 10/23/2015  . Leg swelling 08/11/2015  . Takotsubo syndrome 07/29/2015  . NSTEMI (non-ST elevated myocardial infarction) ([place]) 07/28/2015  . SOB (shortness of breath) 08/15/2014  . Dilated aortic root ([place])   . Pulmonary HTN ([place])   . Permanent atrial fibrillation 07/04/2013  . Chronic anticoagulation 07/04/2013  . Coronary artery disease   . Lesion of nipple 05/15/2013  . GERD  (gastroesophageal reflux disease)   . Hypertension   . COPD (chronic obstructive pulmonary disease) ([place])   . Osteopenia   . Diverticulosis   . Hiatal hernia   . Abdominal pain 01/11/2012  . Breast cancer of upper-outer quadrant of left female breast ([place]) 01/06/2012  . Colon cancer ([place]) 01/06/2012     Prior to Admission medications   Medication Sig Start Date End Date Taking? Authorizing Provider  acetaminophen (TYLENOL) 500 MG tablet Take 500-1,000 mg by mouth every 6 (six) hours as needed for headache (pain).   Yes [provider]  amLODipine (NORVASC) 5 MG tablet Take 1 tablet (5 mg total) by mouth daily. 03/28/18  Yes Larey Dresser, MD  apixaban (ELIQUIS) 5 MG TABS tablet Take 1 tablet (5 mg total) by mouth 2 (two) times daily. 05/09/18  Yes Turner, Eber Hong, MD  augmented betamethasone dipropionate (DIPROLENE-AF) 0.05 % ointment Apply 1 application topically 2 (two) times daily as needed (rash/dry skin).    Yes [provider]  cetirizine (ZYRTEC) 10 MG tablet Take 10 mg by mouth daily as needed for allergies.   Yes [provider]  clonazePAM (KLONOPIN) 0.5 MG tablet TAKE 1 TABLET BY MOUTH TWICE DAILY AS NEEDED FOR ANXIETY Patient taking differently: TAKE 1 TABLET (0.5 MG) BY MOUTH TWICE DAILY AS NEEDED FOR ANXIETY 09/26/17  Yes Dena Billet B, PA-C  cloNIDine (CATAPRES) 0.1 MG tablet Take 1 tablet (0.1 mg total) by mouth 2 (two) times  daily. 12/26/17  Yes Turner, Eber Hong, MD  fexofenadine (ALLEGRA ALLERGY) 180 MG tablet Take 1 tablet (180 mg total) by mouth daily. 03/15/18  Yes Dixon, Mary B, PA-C  Guaifenesin (MUCINEX MAXIMUM STRENGTH) 1200 MG TB12 Take 1,200 mg by mouth daily as needed (congestion).   Yes [provider]  losartan (COZAAR) 100 MG tablet Take 1 tablet (100 mg total) by mouth daily. 03/28/18  Yes Larey Dresser, MD  metoprolol succinate (TOPROL XL) 25 MG 24 hr tablet Take 1 tablet (25 mg total) by mouth daily. 03/28/18  Yes [name], MD  mirtazapine (REMERON) 15 MG tablet TAKE 1 TABLET BY MOUTH AT BEDTIME 05/10/18  Yes Dixon, Mary B, PA-C  nitroGLYCERIN (NITROSTAT) 0.4 MG SL tablet PLACE ONE TABLET UNDER THE TONGUE EVERY FIVE MINUTES AS NEEDED FOR CHEST PAIN. [place] REPEAT FOR THREE DOSES. 09/01/17  Yes Orlena Sheldon, PA-C  omeprazole (PRILOSEC OTC) 20 MG tablet Take 20 mg by mouth daily with supper.   Yes [provider]  ondansetron (ZOFRAN ODT) 4 MG disintegrating tablet Take 1 tablet (4 mg total) by mouth every 8 (eight) hours as needed for nausea or vomiting. 12/26/17  Yes Upstill, Shari, PA-C  OVER THE COUNTER MEDICATION Place 1 drop into both eyes daily as needed (dry eyes). Over the counter lubricating eye drops   Yes [provider]  spironolactone (ALDACTONE) 25 MG tablet TAKE 1/2 (ONE-HALF) TABLET BY MOUTH ONCE DAILY 12/16/17  Yes Turner, Eber Hong, MD  torsemide (DEMADEX) 20 MG tablet Take 3 tablets (60 mg total) by mouth daily. 03/21/18  Yes [name], MD     Allergies  Allergen Reactions  . Contrast Media [Iodinated Diagnostic Agents] Hives and Other (See Comments)    Per pt strong burning sensation starting in chest radiating outward   . Clonidine Derivatives Other (See Comments)    Throat dry  . Statins Other (See Comments)    "bones hurt"  . Sulfa Antibiotics Diarrhea    Tremors   . Celebrex [Celecoxib] Rash  . Isosorbide Nitrate Itching and Rash  . Other Itching and Rash    Plastic and paper tape and heart monitor pads  . Tape Itching and Rash    Red Where applied and will spread     Family History  Problem Relation Age of Onset  . Heart disease Mother   . Heart attack Mother   . Cancer Sister        stomach and colon  . Heart disease Brother 19  . Hypertension Father   . Cancer Sister   . Stroke Neg Hx      Social History   Socioeconomic History  . Marital status: Married    Spouse name: Not on file  . Number of children: Not on file  . Years of  education: Not on file  . Highest education level: Not on file  Occupational History  . Not on file  Social Needs  . Financial resource strain: Not on file  . Food insecurity:    Worry: Not on file    Inability: Not on file  . Transportation needs:    Medical: Not on file    Non-medical: Not on file  Tobacco Use  . Smoking status: Never Smoker  . Smokeless tobacco: Never Used  Substance and Sexual Activity  . Alcohol use: No  . Drug use: No  . Sexual activity: Not Currently  Lifestyle  . Physical activity:    Days  per week: Not on file    Minutes per session: Not on file  . Stress: Not on file  Relationships  . Social connections:    Talks on phone: Not on file    Gets together: Not on file    Attends religious service: Not on file    Active member of club or organization: Not on file    Attends meetings of clubs or organizations: Not on file    Relationship status: Not on file  . Intimate partner violence:    Fear of current or ex partner: Not on file    Emotionally abused: Not on file    Physically abused: Not on file    Forced sexual activity: Not on file  Other Topics Concern  . Not on file  Social History Narrative  . Not on file     Review of Systems     Objective:    Vitals:   06/22/18 1205  BP: 132/62  Pulse: 86  Resp: 16  Temp: 98.2 F (36.8 C)  TempSrc: Oral  SpO2: 95%  Weight: 200 lb (90.7 kg)  Height: 5\' 5"  (1.651 m)      Physical Exam  Constitutional: She appears well-developed and well-nourished.  Non-toxic appearance. She does not have a sickly appearance.  HENT:  Head: Normocephalic and atraumatic.  Nose: Nose normal.  Eyes: Conjunctivae are normal. Right eye exhibits no discharge. Left eye exhibits no discharge.  Neck: No tracheal deviation present.  Cardiovascular: Normal rate and regular rhythm. Exam reveals no gallop and no friction rub.  No murmur heard. Pulmonary/Chest: Effort normal and breath sounds normal. No stridor.  No respiratory distress. She has no wheezes. She has no rales. She exhibits no tenderness.  Musculoskeletal: Normal range of motion.  Neurological: She is alert. She exhibits normal muscle tone. Coordination normal.  Skin: Skin is warm. Rash noted. There is erythema.  b/l lower extremity edema to feet, ankle and shins pitting 2+ with diffuse reticular veins and hyperpigmentation- see pictures of right ankle - erythematous patches to various areas, weeping serous fluid, some with crusted dry skin, ttp, no warmth, no surrounding edema, induration or erythema  Psychiatric: She has a normal mood and affect. Her behavior is normal.  Nursing note and vitals reviewed.             Assessment & Plan:      ICD-10-CM   1. Bilateral lower extremity edema W97.9 COMPLETE METABOLIC PANEL WITH GFR    CBC with Differential/Platelet  2. Dermatitis Y80.1 COMPLETE METABOLIC PANEL WITH GFR    CBC with Differential/Platelet    Suspect dermatitis secondary to bilateral lower extremity edema.  Treat with compression for unna boots b/l (do not think pt will be able to get into compression stockings due to severity of current edema) increase diuretic for 3 days and return for office f/up and removal of unna boots, hope she will be able to then wear compression stockings.  Suspect she Yeats benefit from vascular specialist follow up.     Delsa Grana, PA-C 06/22/18 12:17 PM

## 2018-06-23 ENCOUNTER — Telehealth: Payer: Self-pay | Admitting: Family Medicine

## 2018-06-23 NOTE — Telephone Encounter (Signed)
Pt called for advice for symptoms from wraps on leg. Pt states extreme itching and discomfort. Pt r/s next Tue appt wanting sooner appt. Given sooner appt with [facility] Monday 06/26/18. Please advise. Call pt at home ph.

## 2018-06-23 NOTE — Telephone Encounter (Signed)
Spoke with patient and informed her to keep her appointment for Monday. Patient verbalized understanding

## 2018-06-26 ENCOUNTER — Ambulatory Visit: Payer: Medicare Other | Admitting: Family Medicine

## 2018-06-26 ENCOUNTER — Encounter: Payer: Self-pay | Admitting: Family Medicine

## 2018-06-26 VITALS — BP 160/60 | HR 64 | Temp 98.3°F | Resp 18 | Ht 67.0 in | Wt 196.0 lb

## 2018-06-26 DIAGNOSIS — L309 Dermatitis, unspecified: Secondary | ICD-10-CM | POA: Diagnosis not present

## 2018-06-26 DIAGNOSIS — R600 Localized edema: Secondary | ICD-10-CM

## 2018-06-26 NOTE — Progress Notes (Signed)
Subjective:    Patient ID: [name], female    DOB: 18-Apr-1935, 83 y.o.   MRN: [id number]  Patient was seen last week on Thursday with significant swelling in both legs.  Patient describes what sounds like +2 pitting edema.  [name] Lasix was increased from 60 mg a day to 80 mg a day and she was placed in bilateral Unna boots.  The swelling has improved dramatically in both legs.  She is down to +1 pitting edema distal to the knee.  She has a superficial venous stasis ulcer on the dorsal lateral aspect of [name] right foot.  It is a large patch approximately 6 cm x 3 cm.  The skin there is weeping.  It is very shallow.  The skin there appears macerated.  I reviewed [name] labs from last week.  There was a slight drop in [name] hemoglobin.  She has diuresed approximately 4 pounds.  She denies any chest pain shortness of breath or dyspnea on exertion Wt Readings from Last 3 Encounters:  06/26/18 196 lb (88.9 kg)  06/22/18 200 lb (90.7 kg)  06/07/18 199 lb 9.6 oz (90.5 kg)    Past Medical History:  Diagnosis Date  . Allergy    rhinitis  . Aortic stenosis    mild by echo 06/2017  . Arthritis   . Bradycardia    a. 10/2017 -> beta blocker cut back due to HR 39.  . Breast cancer ([place]) 01/06/2012  . Cancer [facility])    right colon and left breast  . Chronic diastolic CHF (congestive heart failure) ([place])   . Colon cancer ([place]) 01/06/2012  . COPD (chronic obstructive pulmonary disease) ([place])    pt. denies  . Coronary artery disease 2006   a.  NSTEMI in 2016, cath showed 15% prox-mid RCA, 20% prox LAD, EF 25-35% by cath and 35-40% -> felt due to Takotsubo cardiomyopathy.  . Dilated aortic root ([place])    45mmHg by echo 06/2017  . Diverticulosis   . Dyspnea   . Edema extremities   . GERD (gastroesophageal reflux disease)   . Hernia   . Hiatal hernia    denies  . Hyperlipidemia   . Hypertension   . Mild aortic stenosis    echo 11/2015 but not noted on echo 06/2016  . Osteopenia   . Permanent atrial  fibrillation    chronic atrial fibrillation  . Pneumonia    hx child  . Pulmonary HTN ([place])    a. moderate to severe PASP 23mmHg echo 11/2015 - now 62mmHg by echo 06/2017. CTA chest in 11/16 with no PE. PFTs in 7/15 with mild obstructive lung disease. She had a negative sleep study in 2017. b. Felt due to left sided HF.  Marland Kitchen Stroke ([place])   . Takotsubo syndrome 07/29/2015   a. EF 35-40% by echo; akinesis of mid-apical anteroseptal and apical myocardium.  EF now normalized on echo 11/2015   Past Surgical History:  Procedure Laterality Date  . APPENDECTOMY    . BREAST SURGERY     lumpectomy left  . CARDIAC CATHETERIZATION    . CARDIAC CATHETERIZATION N/A 07/28/2015   Procedure: Left Heart Cath and Coronary Angiography;  Surgeon: Peter M Martinique, MD;  Location: [facility];  Service: Cardiovascular;  Laterality: N/A;  . CHOLECYSTECTOMY    . COLECTOMY     right side  . EXCISION OF ACCESSORY NIPPLE Bilateral 05/30/2013   Procedure: BILATERAL NIPPLE BIOPSY;  Surgeon: Merrie Roof, MD;  Location: [facility];  Service: General;  Laterality: Bilateral;  . EYE SURGERY Bilateral 12   cataracts  . IR RADIOLOGIST EVAL & MGMT  07/26/2017  . SPLIT NIGHT STUDY  02/02/2016       Current Outpatient Medications on File Prior to Visit  Medication Sig Dispense Refill  . acetaminophen (TYLENOL) 500 MG tablet Take 500-1,000 mg by mouth every 6 (six) hours as needed for headache (pain).    Marland Kitchen amLODipine (NORVASC) 5 MG tablet Take 1 tablet (5 mg total) by mouth daily. 30 tablet 3  . apixaban (ELIQUIS) 5 MG TABS tablet Take 1 tablet (5 mg total) by mouth 2 (two) times daily. 180 tablet 1  . cetirizine (ZYRTEC) 10 MG tablet Take 10 mg by mouth daily as needed for allergies.    . clonazePAM (KLONOPIN) 0.5 MG tablet TAKE 1 TABLET BY MOUTH TWICE DAILY AS NEEDED FOR ANXIETY (Patient taking differently: TAKE 1 TABLET (0.5 MG) BY MOUTH TWICE DAILY AS NEEDED FOR ANXIETY) 60 tablet 2  . cloNIDine (CATAPRES) 0.1 MG  tablet Take 1 tablet (0.1 mg total) by mouth 2 (two) times daily. 180 tablet 2  . fexofenadine (ALLEGRA ALLERGY) 180 MG tablet Take 1 tablet (180 mg total) by mouth daily. 30 tablet 3  . Guaifenesin (MUCINEX MAXIMUM STRENGTH) 1200 MG TB12 Take 1,200 mg by mouth daily as needed (congestion).    Marland Kitchen losartan (COZAAR) 100 MG tablet Take 1 tablet (100 mg total) by mouth daily. 30 tablet 3  . metoprolol succinate (TOPROL XL) 25 MG 24 hr tablet Take 1 tablet (25 mg total) by mouth daily. 90 tablet 3  . mirtazapine (REMERON) 15 MG tablet TAKE 1 TABLET BY MOUTH AT BEDTIME 90 tablet 0  . nitroGLYCERIN (NITROSTAT) 0.4 MG SL tablet PLACE ONE TABLET UNDER THE TONGUE EVERY FIVE MINUTES AS NEEDED FOR CHEST PAIN. [place] REPEAT FOR THREE DOSES. 30 tablet 2  . omeprazole (PRILOSEC OTC) 20 MG tablet Take 20 mg by mouth daily with supper.    . ondansetron (ZOFRAN ODT) 4 MG disintegrating tablet Take 1 tablet (4 mg total) by mouth every 8 (eight) hours as needed for nausea or vomiting. 20 tablet 0  . OVER THE COUNTER MEDICATION Place 1 drop into both eyes daily as needed (dry eyes). Over the counter lubricating eye drops    . spironolactone (ALDACTONE) 25 MG tablet TAKE 1/2 (ONE-HALF) TABLET BY MOUTH ONCE DAILY 45 tablet 3  . torsemide (DEMADEX) 20 MG tablet Take 3 tablets (60 mg total) by mouth daily. 90 tablet 11   No current facility-administered medications on file prior to visit.    Allergies  Allergen Reactions  . Contrast Media [Iodinated Diagnostic Agents] Hives and Other (See Comments)    Per pt strong burning sensation starting in chest radiating outward   . Clonidine Derivatives Other (See Comments)    Throat dry  . Statins Other (See Comments)    "bones hurt"  . Sulfa Antibiotics Diarrhea    Tremors   . Celebrex [Celecoxib] Rash  . Isosorbide Nitrate Itching and Rash  . Other Itching and Rash    Plastic and paper tape and heart monitor pads  . Tape Itching and Rash    Red Where applied and will  spread   Social History   Socioeconomic History  . Marital status: Married    Spouse name: Not on file  . Number of children: Not on file  . Years of education: Not on file  . Highest education level:  Not on file  Occupational History  . Not on file  Social Needs  . Financial resource strain: Not on file  . Food insecurity:    Worry: Not on file    Inability: Not on file  . Transportation needs:    Medical: Not on file    Non-medical: Not on file  Tobacco Use  . Smoking status: Never Smoker  . Smokeless tobacco: Never Used  Substance and Sexual Activity  . Alcohol use: No  . Drug use: No  . Sexual activity: Not Currently  Lifestyle  . Physical activity:    Days per week: Not on file    Minutes per session: Not on file  . Stress: Not on file  Relationships  . Social connections:    Talks on phone: Not on file    Gets together: Not on file    Attends religious service: Not on file    Active member of club or organization: Not on file    Attends meetings of clubs or organizations: Not on file    Relationship status: Not on file  . Intimate partner violence:    Fear of current or ex partner: Not on file    Emotionally abused: Not on file    Physically abused: Not on file    Forced sexual activity: Not on file  Other Topics Concern  . Not on file  Social History Narrative  . Not on file      Review of Systems  Musculoskeletal: Positive for back pain.  All other systems reviewed and are negative.      Objective:   Physical Exam  Cardiovascular: Normal rate and normal heart sounds.  Pulmonary/Chest: Effort normal and breath sounds normal.  Abdominal: Normal appearance and bowel sounds are normal. She exhibits no distension. There is no tenderness. There is no rigidity, no rebound and no guarding.  Musculoskeletal: She exhibits edema.  Skin: Skin is warm. There is erythema.     Vitals reviewed.         Assessment & Plan:  Bilateral lower extremity  edema  Dermatitis  She has bilateral lower extremity edema and what appears to be a venous stasis shallow ulcer developing on the dorsal lateral aspect of the right foot.  I cover the wound with Silvadene and a nonadherent gauze.  I then placed the patient in bilateral Unna boots.  I recommended she decrease [name] Lasix to 60 mg a day and recheck on Friday.  At that time if the swelling has improved dramatically we will discontinue Unna boots, and switch the patient to TED hose to maintain and prevent future swelling.  On Friday I plan to recheck a CBC as well as a BMP I feel some of the drop in [name] hemoglobin [place] have been dilutional

## 2018-06-27 ENCOUNTER — Ambulatory Visit: Payer: Medicare Other | Admitting: Family Medicine

## 2018-06-30 ENCOUNTER — Ambulatory Visit: Payer: Medicare Other | Admitting: Family Medicine

## 2018-06-30 ENCOUNTER — Encounter: Payer: Self-pay | Admitting: Family Medicine

## 2018-06-30 VITALS — BP 130/54 | HR 46 | Temp 98.2°F | Resp 18 | Ht 67.0 in | Wt 196.0 lb

## 2018-06-30 DIAGNOSIS — R600 Localized edema: Secondary | ICD-10-CM

## 2018-06-30 NOTE — Progress Notes (Signed)
Subjective:    Patient ID: [name], female    DOB: 1935-07-29, 83 y.o.   MRN: [id number] 06/26/18 Patient was seen last week on Thursday with significant swelling in both legs.  Patient describes what sounds like +2 pitting edema.  Her Lasix was increased from 60 mg a day to 80 mg a day and she was placed in bilateral Unna boots.  The swelling has improved dramatically in both legs.  She is down to +1 pitting edema distal to the knee.  She has a superficial venous stasis ulcer on the dorsal lateral aspect of her right foot.  It is a large patch approximately 6 cm x 3 cm.  The skin there is weeping.  It is very shallow.  The skin there appears macerated.  I reviewed her labs from last week.  There was a slight drop in her hemoglobin.  She has diuresed approximately 4 pounds.  She denies any chest pain shortness of breath or dyspnea on exertion Wt Readings from Last 3 Encounters:  06/26/18 196 lb (88.9 kg)  06/22/18 200 lb (90.7 kg)  06/07/18 199 lb 9.6 oz (90.5 kg)  At that time, my plan was: She has bilateral lower extremity edema and what appears to be a venous stasis shallow ulcer developing on the dorsal lateral aspect of the right foot.  I cover the wound with Silvadene and a nonadherent gauze.  I then placed the patient in bilateral Unna boots.  I recommended she decrease her Lasix to 60 mg a day and recheck on Friday.  At that time if the swelling has improved dramatically we will discontinue Unna boots, and switch the patient to TED hose to maintain and prevent future swelling.  On Friday I plan to recheck a CBC as well as a BMP I feel some of the drop in her hemoglobin [place] have been dilutional  06/30/18 The swelling in her legs has improved dramatically.  The shallow ulcer developing on the dorsum and lateral aspect of her right foot has completely healed.  There is no weeping edema.  There is no erythema.  She denies any pain in her legs.  She does have a small half centimeter erythematous  pustule on the angle of her mandible on the left side.  There is a central pore showing a previous sebaceous cyst at approximately 11:00.  There is an opening near 4:00.  With gentle pressure, I expressed purulent material indicating an inflamed sebaceous cyst.  Patient states that this is been there for about 2 days.  It appears to be an inflamed small sebaceous cyst roughly the size of pencil eraser.  I believe no further treatment is necessary after having expressed the cyst contents with pressure.  If the lesion persists, we could perform an excision.   Weight is unchanged from 10/21 at 196 lbs.    Past Medical History:  Diagnosis Date  . Allergy    rhinitis  . Aortic stenosis    mild by echo 06/2017  . Arthritis   . Bradycardia    a. 10/2017 -> beta blocker cut back due to HR 39.  . Breast cancer ([place]) 01/06/2012  . Cancer [facility])    right colon and left breast  . Chronic diastolic CHF (congestive heart failure) ([place])   . Colon cancer ([place]) 01/06/2012  . COPD (chronic obstructive pulmonary disease) ([place])    pt. denies  . Coronary artery disease 2006   a.  NSTEMI in 2016, cath showed 15%  prox-mid RCA, 20% prox LAD, EF 25-35% by cath and 35-40% -> felt due to Takotsubo cardiomyopathy.  . Dilated aortic root ([place])    41mmHg by echo 06/2017  . Diverticulosis   . Dyspnea   . Edema extremities   . GERD (gastroesophageal reflux disease)   . Hernia   . Hiatal hernia    denies  . Hyperlipidemia   . Hypertension   . Mild aortic stenosis    echo 11/2015 but not noted on echo 06/2016  . Osteopenia   . Permanent atrial fibrillation    chronic atrial fibrillation  . Pneumonia    hx child  . Pulmonary HTN ([place])    a. moderate to severe PASP 67mmHg echo 11/2015 - now 18mmHg by echo 06/2017. CTA chest in 11/16 with no PE. PFTs in 7/15 with mild obstructive lung disease. She had a negative sleep study in 2017. b. Felt due to left sided HF.  Marland Kitchen Stroke ([place])   . Takotsubo syndrome 07/29/2015   a.  EF 35-40% by echo; akinesis of mid-apical anteroseptal and apical myocardium.  EF now normalized on echo 11/2015   Past Surgical History:  Procedure Laterality Date  . APPENDECTOMY    . BREAST SURGERY     lumpectomy left  . CARDIAC CATHETERIZATION    . CARDIAC CATHETERIZATION N/A 07/28/2015   Procedure: Left Heart Cath and Coronary Angiography;  Surgeon: Peter M Martinique, MD;  Location: [facility];  Service: Cardiovascular;  Laterality: N/A;  . CHOLECYSTECTOMY    . COLECTOMY     right side  . EXCISION OF ACCESSORY NIPPLE Bilateral 05/30/2013   Procedure: BILATERAL NIPPLE BIOPSY;  Surgeon: Merrie Roof, MD;  Location: [facility];  Service: General;  Laterality: Bilateral;  . EYE SURGERY Bilateral 12   cataracts  . IR RADIOLOGIST EVAL & MGMT  07/26/2017  . SPLIT NIGHT STUDY  02/02/2016       Current Outpatient Medications on File Prior to Visit  Medication Sig Dispense Refill  . acetaminophen (TYLENOL) 500 MG tablet Take 500-1,000 mg by mouth every 6 (six) hours as needed for headache (pain).    Marland Kitchen amLODipine (NORVASC) 5 MG tablet Take 1 tablet (5 mg total) by mouth daily. 30 tablet 3  . apixaban (ELIQUIS) 5 MG TABS tablet Take 1 tablet (5 mg total) by mouth 2 (two) times daily. 180 tablet 1  . cetirizine (ZYRTEC) 10 MG tablet Take 10 mg by mouth daily as needed for allergies.    . clonazePAM (KLONOPIN) 0.5 MG tablet TAKE 1 TABLET BY MOUTH TWICE DAILY AS NEEDED FOR ANXIETY (Patient taking differently: TAKE 1 TABLET (0.5 MG) BY MOUTH TWICE DAILY AS NEEDED FOR ANXIETY) 60 tablet 2  . cloNIDine (CATAPRES) 0.1 MG tablet Take 1 tablet (0.1 mg total) by mouth 2 (two) times daily. 180 tablet 2  . fexofenadine (ALLEGRA ALLERGY) 180 MG tablet Take 1 tablet (180 mg total) by mouth daily. 30 tablet 3  . Guaifenesin (MUCINEX MAXIMUM STRENGTH) 1200 MG TB12 Take 1,200 mg by mouth daily as needed (congestion).    Marland Kitchen losartan (COZAAR) 100 MG tablet Take 1 tablet (100 mg total) by mouth daily. 30 tablet 3   . metoprolol succinate (TOPROL XL) 25 MG 24 hr tablet Take 1 tablet (25 mg total) by mouth daily. 90 tablet 3  . mirtazapine (REMERON) 15 MG tablet TAKE 1 TABLET BY MOUTH AT BEDTIME 90 tablet 0  . nitroGLYCERIN (NITROSTAT) 0.4 MG SL tablet PLACE ONE TABLET UNDER  THE TONGUE EVERY FIVE MINUTES AS NEEDED FOR CHEST PAIN. [place] REPEAT FOR THREE DOSES. 30 tablet 2  . omeprazole (PRILOSEC OTC) 20 MG tablet Take 20 mg by mouth daily with supper.    . ondansetron (ZOFRAN ODT) 4 MG disintegrating tablet Take 1 tablet (4 mg total) by mouth every 8 (eight) hours as needed for nausea or vomiting. 20 tablet 0  . OVER THE COUNTER MEDICATION Place 1 drop into both eyes daily as needed (dry eyes). Over the counter lubricating eye drops    . spironolactone (ALDACTONE) 25 MG tablet TAKE 1/2 (ONE-HALF) TABLET BY MOUTH ONCE DAILY 45 tablet 3  . torsemide (DEMADEX) 20 MG tablet Take 3 tablets (60 mg total) by mouth daily. 90 tablet 11   No current facility-administered medications on file prior to visit.    Allergies  Allergen Reactions  . Contrast Media [Iodinated Diagnostic Agents] Hives and Other (See Comments)    Per pt strong burning sensation starting in chest radiating outward   . Clonidine Derivatives Other (See Comments)    Throat dry  . Statins Other (See Comments)    "bones hurt"  . Sulfa Antibiotics Diarrhea    Tremors   . Celebrex [Celecoxib] Rash  . Isosorbide Nitrate Itching and Rash  . Other Itching and Rash    Plastic and paper tape and heart monitor pads  . Tape Itching and Rash    Red Where applied and will spread   Social History   Socioeconomic History  . Marital status: Married    Spouse name: Not on file  . Number of children: Not on file  . Years of education: Not on file  . Highest education level: Not on file  Occupational History  . Not on file  Social Needs  . Financial resource strain: Not on file  . Food insecurity:    Worry: Not on file    Inability: Not on file    . Transportation needs:    Medical: Not on file    Non-medical: Not on file  Tobacco Use  . Smoking status: Never Smoker  . Smokeless tobacco: Never Used  Substance and Sexual Activity  . Alcohol use: No  . Drug use: No  . Sexual activity: Not Currently  Lifestyle  . Physical activity:    Days per week: Not on file    Minutes per session: Not on file  . Stress: Not on file  Relationships  . Social connections:    Talks on phone: Not on file    Gets together: Not on file    Attends religious service: Not on file    Active member of club or organization: Not on file    Attends meetings of clubs or organizations: Not on file    Relationship status: Not on file  . Intimate partner violence:    Fear of current or ex partner: Not on file    Emotionally abused: Not on file    Physically abused: Not on file    Forced sexual activity: Not on file  Other Topics Concern  . Not on file  Social History Narrative  . Not on file      Review of Systems  Musculoskeletal: Positive for back pain.  All other systems reviewed and are negative.      Objective:   Physical Exam  Cardiovascular: Normal rate and normal heart sounds.  Pulmonary/Chest: Effort normal and breath sounds normal.  Abdominal: Normal appearance and bowel sounds are normal. She exhibits  no distension. There is no tenderness. There is no rigidity, no rebound and no guarding.  Musculoskeletal: She exhibits no edema.  Skin: Skin is warm. No erythema.  Vitals reviewed.         Assessment & Plan:  Bilateral lower extremity edema - Plan: CBC with Differential/Platelet, BASIC METABOLIC PANEL WITH GFR  I remove the Unna boots today.  Legs look much better.  I recommended the patient start wearing TED hose to prevent this in the future.  Continue her chronic dose of Lasix 60 mg a day in addition to the TED hose.  The small inflamed sebaceous cyst I think will resolve now that the cyst contents have been expressed  with pressure.  If persistent we can excise the cyst.  It does not appear to require antibiotics at the present time.  It is only 5 to 6 mm in diameter and it has now been effectively drained.  I have recommended Mucinex for chest congestion due to the rhinorrhea and cold that she is experiencing

## 2018-07-03 ENCOUNTER — Other Ambulatory Visit (HOSPITAL_COMMUNITY): Payer: Self-pay | Admitting: Cardiology

## 2018-07-04 ENCOUNTER — Encounter: Payer: Self-pay | Admitting: Cardiology

## 2018-07-04 ENCOUNTER — Telehealth: Payer: Self-pay | Admitting: Cardiology

## 2018-07-04 NOTE — Telephone Encounter (Signed)
Pt calling stating that she is having side effects takting Losartan and Clonidine, breaking out with bumps and pt would like a call back as soon as possible, concerning this matter. Please address

## 2018-07-04 NOTE — Telephone Encounter (Signed)
Spoke with the patient, she has had raised red bumps, a little bigger than pencil head.  She said they are itchy and require anti-itch cream the patient has been using expensive itch cream, she did not know the name. She said it started around 1 month ago and she said that her losartan pill shape and color changed a month ago as well. She believes losartan is causing her problems.   Sending to Dr. [name] for recommendations.

## 2018-07-04 NOTE — Telephone Encounter (Signed)
Duplicate

## 2018-07-04 NOTE — Telephone Encounter (Signed)
New Message   Pt c/o medication issue:  1. Name of Medication: Losartan  2. How are you currently taking this medication (dosage and times per day)? TAKE 1 TABLET BY MOUTH ONCE DAILY  3. Are you having a reaction (difficulty breathing--STAT)? no  4. What is your medication issue? Pt states she is having break outs due to the medication. Please call

## 2018-07-04 NOTE — Telephone Encounter (Signed)
Please get patient in to see her PCP about this

## 2018-07-05 NOTE — Telephone Encounter (Signed)
Spoke with the patient, she expressed understanding and is scheduled to see a skin specialist about her rash.

## 2018-07-07 DIAGNOSIS — R600 Localized edema: Secondary | ICD-10-CM | POA: Diagnosis not present

## 2018-07-11 DIAGNOSIS — Z8719 Personal history of other diseases of the digestive system: Secondary | ICD-10-CM | POA: Diagnosis not present

## 2018-07-11 DIAGNOSIS — K5790 Diverticulosis of intestine, part unspecified, without perforation or abscess without bleeding: Secondary | ICD-10-CM | POA: Diagnosis not present

## 2018-07-12 ENCOUNTER — Ambulatory Visit: Payer: Medicare Other | Admitting: Physician Assistant

## 2018-07-12 DIAGNOSIS — L308 Other specified dermatitis: Secondary | ICD-10-CM | POA: Diagnosis not present

## 2018-07-12 DIAGNOSIS — L309 Dermatitis, unspecified: Secondary | ICD-10-CM | POA: Diagnosis not present

## 2018-07-14 ENCOUNTER — Encounter: Payer: Self-pay | Admitting: Family Medicine

## 2018-07-14 ENCOUNTER — Ambulatory Visit: Payer: Medicare Other | Admitting: Family Medicine

## 2018-07-14 ENCOUNTER — Ambulatory Visit: Payer: Medicare Other | Admitting: Physician Assistant

## 2018-07-14 VITALS — BP 140/58 | HR 60 | Temp 98.6°F | Resp 16 | Ht 67.0 in | Wt 198.0 lb

## 2018-07-14 DIAGNOSIS — R319 Hematuria, unspecified: Secondary | ICD-10-CM | POA: Diagnosis not present

## 2018-07-14 DIAGNOSIS — R58 Hemorrhage, not elsewhere classified: Secondary | ICD-10-CM | POA: Diagnosis not present

## 2018-07-14 DIAGNOSIS — Z23 Encounter for immunization: Secondary | ICD-10-CM

## 2018-07-14 LAB — URINALYSIS, ROUTINE W REFLEX MICROSCOPIC
Bilirubin Urine: NEGATIVE
Glucose, UA: NEGATIVE
KETONES UR: NEGATIVE
NITRITE: NEGATIVE
Protein, ur: NEGATIVE
Specific Gravity, Urine: 1.01 (ref 1.001–1.03)
pH: 6.5 (ref 5.0–8.0)

## 2018-07-14 LAB — MICROSCOPIC MESSAGE

## 2018-07-14 MED ORDER — CIPROFLOXACIN HCL 250 MG PO TABS: 250.0000 mg | ORAL_TABLET | Freq: Two times a day (BID) | ORAL | 0 refills | Status: DC

## 2018-07-14 NOTE — Addendum Note (Signed)
Addended by: Shary Decamp B on: 07/14/2018 02:30 PM   Modules accepted: Orders

## 2018-07-14 NOTE — Progress Notes (Signed)
Subjective:    Patient ID: [name], female    DOB: [birth year], 83 y.o.   MRN: [id number] Yesterday, the patient saw blood on the pad in her underwear.  There were just a few drops.  [name] then went to the restroom and found a few drops of blood in the toilet bowl when [name] tried to pee.  [name] is not sure if it came from her vagina or if it came in the urine.  [name] denies any blood with defecation or blood in her stool or blood on the toilet tissue when [name] wipes after having a bowel movement.  [name] does report some mild pelvic discomfort and dysuria.  Urinalysis today shows +2 leukocyte esterase and +2 blood. Past Medical History:  Diagnosis Date  . Allergy    rhinitis  . Aortic stenosis    mild by echo 06/2017  . Arthritis   . Bradycardia    a. 10/2017 -> beta blocker cut back due to HR 39.  . Breast cancer ([place]) 01/06/2012  . Cancer [facility])    right colon and left breast  . Chronic diastolic CHF (congestive heart failure) ([place])   . Colon cancer ([place]) 01/06/2012  . COPD (chronic obstructive pulmonary disease) ([place])    pt. denies  . Coronary artery disease 2006   a.  NSTEMI in 2016, cath showed 15% prox-mid RCA, 20% prox LAD, EF 25-35% by cath and 35-40% -> felt due to Takotsubo cardiomyopathy.  . Dilated aortic root ([place])    19mmHg by echo 06/2017  . Diverticulosis   . Dyspnea   . Edema extremities   . GERD (gastroesophageal reflux disease)   . Hernia   . Hiatal hernia    denies  . Hyperlipidemia   . Hypertension   . Mild aortic stenosis    echo 11/2015 but not noted on echo 06/2016  . Osteopenia   . Permanent atrial fibrillation    chronic atrial fibrillation  . Pneumonia    hx child  . Pulmonary HTN ([place])    a. moderate to severe PASP 52mmHg echo 11/2015 - now 92mmHg by echo 06/2017. CTA chest in 11/16 with no PE. PFTs in 7/15 with mild obstructive lung disease. [name] had a negative sleep study in 2017. b. Felt due to left sided HF.  Marland Kitchen Stroke ([place])   . Takotsubo syndrome 07/29/2015     a. EF 35-40% by echo; akinesis of mid-apical anteroseptal and apical myocardium.  EF now normalized on echo 11/2015   Past Surgical History:  Procedure Laterality Date  . APPENDECTOMY    . BREAST SURGERY     lumpectomy left  . CARDIAC CATHETERIZATION    . CARDIAC CATHETERIZATION N/A 07/28/2015   Procedure: Left Heart Cath and Coronary Angiography;  Surgeon: Peter M Martinique, MD;  Location: [facility];  Service: Cardiovascular;  Laterality: N/A;  . CHOLECYSTECTOMY    . COLECTOMY     right side  . EXCISION OF ACCESSORY NIPPLE Bilateral 05/30/2013   Procedure: BILATERAL NIPPLE BIOPSY;  Surgeon: Merrie Roof, MD;  Location: [facility];  Service: General;  Laterality: Bilateral;  . EYE SURGERY Bilateral 12   cataracts  . IR RADIOLOGIST EVAL & MGMT  07/26/2017  . SPLIT NIGHT STUDY  02/02/2016       Current Outpatient Medications on File Prior to Visit  Medication Sig Dispense Refill  . acetaminophen (TYLENOL) 500 MG tablet Take 500-1,000 mg by mouth every 6 (six) hours as needed for  headache (pain).    Marland Kitchen amLODipine (NORVASC) 5 MG tablet Take 1 tablet (5 mg total) by mouth daily. 30 tablet 3  . apixaban (ELIQUIS) 5 MG TABS tablet Take 1 tablet (5 mg total) by mouth 2 (two) times daily. 180 tablet 1  . cetirizine (ZYRTEC) 10 MG tablet Take 10 mg by mouth daily as needed for allergies.    . clonazePAM (KLONOPIN) 0.5 MG tablet TAKE 1 TABLET BY MOUTH TWICE DAILY AS NEEDED FOR ANXIETY (Patient taking differently: TAKE 1 TABLET (0.5 MG) BY MOUTH TWICE DAILY AS NEEDED FOR ANXIETY) 60 tablet 2  . cloNIDine (CATAPRES) 0.1 MG tablet Take 1 tablet (0.1 mg total) by mouth 2 (two) times daily. 180 tablet 2  . fexofenadine (ALLEGRA ALLERGY) 180 MG tablet Take 1 tablet (180 mg total) by mouth daily. 30 tablet 3  . Guaifenesin (MUCINEX MAXIMUM STRENGTH) 1200 MG TB12 Take 1,200 mg by mouth daily as needed (congestion).    Marland Kitchen losartan (COZAAR) 100 MG tablet TAKE 1 TABLET BY MOUTH ONCE DAILY 90 tablet 1  .  metoprolol succinate (TOPROL XL) 25 MG 24 hr tablet Take 1 tablet (25 mg total) by mouth daily. 90 tablet 3  . mirtazapine (REMERON) 15 MG tablet TAKE 1 TABLET BY MOUTH AT BEDTIME 90 tablet 0  . nitroGLYCERIN (NITROSTAT) 0.4 MG SL tablet PLACE ONE TABLET UNDER THE TONGUE EVERY FIVE MINUTES AS NEEDED FOR CHEST PAIN. Consuegra REPEAT FOR THREE DOSES. 30 tablet 2  . omeprazole (PRILOSEC OTC) 20 MG tablet Take 20 mg by mouth daily with supper.    . ondansetron (ZOFRAN ODT) 4 MG disintegrating tablet Take 1 tablet (4 mg total) by mouth every 8 (eight) hours as needed for nausea or vomiting. 20 tablet 0  . OVER THE COUNTER MEDICATION Place 1 drop into both eyes daily as needed (dry eyes). Over the counter lubricating eye drops    . spironolactone (ALDACTONE) 25 MG tablet TAKE 1/2 (ONE-HALF) TABLET BY MOUTH ONCE DAILY 45 tablet 3  . torsemide (DEMADEX) 20 MG tablet Take 3 tablets (60 mg total) by mouth daily. 90 tablet 11  . cefdinir (OMNICEF) 300 MG capsule Take by mouth.     No current facility-administered medications on file prior to visit.    Allergies  Allergen Reactions  . Contrast Media [Iodinated Diagnostic Agents] Hives and Other (See Comments)    Per pt strong burning sensation starting in chest radiating outward   . Clonidine Derivatives Other (See Comments)    Throat dry  . Statins Other (See Comments)    "bones hurt"  . Sulfa Antibiotics Diarrhea    Tremors   . Celebrex [Celecoxib] Rash  . Isosorbide Nitrate Itching and Rash  . Other Itching and Rash    Plastic and paper tape and heart monitor pads  . Tape Itching and Rash    Red Where applied and will spread   Social History   Socioeconomic History  . Marital status: Married    Spouse name: Not on file  . Number of children: Not on file  . Years of education: Not on file  . Highest education level: Not on file  Occupational History  . Not on file  Social Needs  . Financial resource strain: Not on file  . Food insecurity:      Worry: Not on file    Inability: Not on file  . Transportation needs:    Medical: Not on file    Non-medical: Not on file  Tobacco Use  .  Smoking status: Never Smoker  . Smokeless tobacco: Never Used  Substance and Sexual Activity  . Alcohol use: No  . Drug use: No  . Sexual activity: Not Currently  Lifestyle  . Physical activity:    Days per week: Not on file    Minutes per session: Not on file  . Stress: Not on file  Relationships  . Social connections:    Talks on phone: Not on file    Gets together: Not on file    Attends religious service: Not on file    Active member of club or organization: Not on file    Attends meetings of clubs or organizations: Not on file    Relationship status: Not on file  . Intimate partner violence:    Fear of current or ex partner: Not on file    Emotionally abused: Not on file    Physically abused: Not on file    Forced sexual activity: Not on file  Other Topics Concern  . Not on file  Social History Narrative  . Not on file      Review of Systems  Musculoskeletal: Positive for back pain.  All other systems reviewed and are negative.      Objective:   Physical Exam  Cardiovascular: Normal rate and normal heart sounds.  Pulmonary/Chest: Effort normal and breath sounds normal.  Abdominal: Normal appearance and bowel sounds are normal. [name] exhibits no distension. There is no tenderness. There is no rigidity, no rebound and no guarding. Hernia confirmed negative in the right inguinal area and confirmed negative in the left inguinal area.  Genitourinary: Rectum normal, vagina normal and uterus normal. Rectal exam shows no external hemorrhoid, no internal hemorrhoid, anal tone normal and guaiac negative stool. There is no rash or lesion on the right labia. There is no rash or lesion on the left labia. Cervix exhibits no motion tenderness, no discharge and no friability. Right adnexum displays no mass and no tenderness. Left adnexum  displays no mass and no tenderness. No erythema or bleeding in the vagina. No vaginal discharge found.  Musculoskeletal: [name] exhibits no edema.  Lymphadenopathy:       Right: No inguinal adenopathy present.       Left: No inguinal adenopathy present.  Skin: Skin is warm. No erythema.  Vitals reviewed.  Patient was placed in a dorsal lithotomy position.  Speculum exam was performed.  Cervix was visualized.  There was no blood coming from the loss.  There was no friability.  There was no lesions seen within the vagina.  There was no palpable abnormality.  Rectal exam was performed.  There was no palpable rectal mass.  There was no bleeding external hemorrhoid visualized.       Assessment & Plan:  Blood in toilet bowl - Plan: Urinalysis, Routine w reflex microscopic  Encounter for immunization - Plan: Flu vaccine HIGH DOSE PF  I am uncertain of the source at the present time however I suspect that it came from the bladder.  I suspect possibly a urinary tract infection.  I will treat this with Cipro 250 mg p.o. twice daily for 3 days and send a urine culture.  Patient will notify me if symptoms persist.  [name] Villegas need cystoscopy as well as an ultrasound of the kidneys and also an ultrasound of the ovaries and uterus to rule out pelvic pathology the bleeding persist.  It does not appear to be rectal in source.

## 2018-07-16 ENCOUNTER — Emergency Department (HOSPITAL_COMMUNITY)
Admission: EM | Admit: 2018-07-16 | Discharge: 2018-07-16 | Disposition: A | Discharge status: Home / Self Care | Payer: Medicare Other | Attending: Emergency Medicine | Admitting: Emergency Medicine

## 2018-07-16 ENCOUNTER — Other Ambulatory Visit: Payer: Self-pay

## 2018-07-16 ENCOUNTER — Emergency Department (HOSPITAL_COMMUNITY): Payer: Medicare Other

## 2018-07-16 DIAGNOSIS — R319 Hematuria, unspecified: Secondary | ICD-10-CM | POA: Diagnosis not present

## 2018-07-16 DIAGNOSIS — I5032 Chronic diastolic (congestive) heart failure: Secondary | ICD-10-CM | POA: Diagnosis not present

## 2018-07-16 DIAGNOSIS — N3001 Acute cystitis with hematuria: Secondary | ICD-10-CM | POA: Insufficient documentation

## 2018-07-16 DIAGNOSIS — J449 Chronic obstructive pulmonary disease, unspecified: Secondary | ICD-10-CM | POA: Insufficient documentation

## 2018-07-16 DIAGNOSIS — Z79899 Other long term (current) drug therapy: Secondary | ICD-10-CM | POA: Insufficient documentation

## 2018-07-16 DIAGNOSIS — I110 Hypertensive heart disease with heart failure: Secondary | ICD-10-CM | POA: Diagnosis not present

## 2018-07-16 LAB — COMPREHENSIVE METABOLIC PANEL
ALT: 14 U/L (ref 0–44)
AST: 22 U/L (ref 15–41)
Albumin: 3.4 g/dL — ABNORMAL LOW (ref 3.5–5.0)
Alkaline Phosphatase: 67 U/L (ref 38–126)
Anion gap: 8 (ref 5–15)
BUN: 15 mg/dL (ref 8–23)
CHLORIDE: 98 mmol/L (ref 98–111)
CO2: 28 mmol/L (ref 22–32)
Calcium: 8.8 mg/dL — ABNORMAL LOW (ref 8.9–10.3)
Creatinine, Ser: 1.14 mg/dL — ABNORMAL HIGH (ref 0.44–1.00)
GFR calc Af Amer: 51 mL/min — ABNORMAL LOW (ref >60)
GFR, EST NON AFRICAN AMERICAN: 44 mL/min — AB (ref >60)
Glucose, Bld: 141 mg/dL — ABNORMAL HIGH (ref 70–99)
POTASSIUM: 3.2 mmol/L — AB (ref 3.5–5.1)
Sodium: 134 mmol/L — ABNORMAL LOW (ref 135–145)
Total Bilirubin: 0.6 mg/dL (ref 0.3–1.2)
Total Protein: 7.3 g/dL (ref 6.5–8.1)

## 2018-07-16 LAB — CBC WITH DIFFERENTIAL/PLATELET
ABS IMMATURE GRANULOCYTES: 0.01 10*3/uL (ref 0.00–0.07)
BASOS ABS: 0 10*3/uL (ref 0.0–0.1)
Basophils Relative: 0 %
Eosinophils Absolute: 0.2 10*3/uL (ref 0.0–0.5)
Eosinophils Relative: 4 %
HCT: 35.3 % — ABNORMAL LOW (ref 36.0–46.0)
Hemoglobin: 10.6 g/dL — ABNORMAL LOW (ref 12.0–15.0)
IMMATURE GRANULOCYTES: 0 %
LYMPHS PCT: 30 %
Lymphs Abs: 1.5 10*3/uL (ref 0.7–4.0)
MCH: 27.9 pg (ref 26.0–34.0)
MCHC: 30 g/dL (ref 30.0–36.0)
MCV: 92.9 fL (ref 80.0–100.0)
MONOS PCT: 13 %
Monocytes Absolute: 0.6 10*3/uL (ref 0.1–1.0)
NEUTROS PCT: 53 %
NRBC: 0 % (ref 0.0–0.2)
Neutro Abs: 2.6 10*3/uL (ref 1.7–7.7)
PLATELETS: 257 10*3/uL (ref 150–400)
RBC: 3.8 MIL/uL — ABNORMAL LOW (ref 3.87–5.11)
RDW: 13.7 % (ref 11.5–15.5)
WBC: 5 10*3/uL (ref 4.0–10.5)

## 2018-07-16 LAB — URINALYSIS, ROUTINE W REFLEX MICROSCOPIC
Bilirubin Urine: NEGATIVE
GLUCOSE, UA: NEGATIVE mg/dL
Ketones, ur: NEGATIVE mg/dL
Nitrite: NEGATIVE
PROTEIN: 30 mg/dL — AB
SPECIFIC GRAVITY, URINE: 1.013 (ref 1.005–1.030)
WBC, UA: >50 WBC/hpf — ABNORMAL HIGH (ref 0–5)
pH: 6 (ref 5.0–8.0)

## 2018-07-16 LAB — URINE CULTURE
MICRO NUMBER: 91348311
SPECIMEN QUALITY:: ADEQUATE

## 2018-07-16 LAB — LIPASE, BLOOD: LIPASE: 29 U/L (ref 11–51)

## 2018-07-16 MED ORDER — CEPHALEXIN 500 MG PO CAPS: 500.0000 mg | ORAL_CAPSULE | Freq: Two times a day (BID) | ORAL | 0 refills | Status: AC

## 2018-07-16 MED ORDER — POTASSIUM CHLORIDE CRYS ER 20 MEQ PO TBCR
40.0000 meq | EXTENDED_RELEASE_TABLET | Freq: Once | ORAL | Status: AC
  Administered 2018-07-16: 40 meq via ORAL
  Filled 2018-07-16: qty 2

## 2018-07-16 NOTE — ED Provider Notes (Signed)
[facility] EMERGENCY DEPARTMENT Provider Note   CSN: [id number] Arrival date & time: 07/16/18  1212   History   Chief Complaint Chief Complaint  Patient presents with  . Hematuria    HPI [name] is a 83 y.o. female with a past medical history significant for aortic stenosis, CHF, hyperlipidemia, hypertension, permanent atrial fibrillation, post status right hemicolectomy who presents for evaluation of hematuria.  Patient states she was seen at her primary care doctors on Friday for same issue.  Patient states that she was told she had a urinary tract infection and was started on Cipro.  Patient states she did not have any blood in her urine yesterday, however when she went to urinate this morning she noticed bright red blood when she urinated.  Denies fever, chills, nausea, vomiting, abdominal pain, dysuria, decreased urination, melena, bright red blood per rectum.  Patient admits to chronic low back pain.  States her back pain is unchanged from her chronic pain.  Denies radiation of her pain, numbness or tingling in her extremities, bowel or bladder incontinence, saddle paresthesia.  Denies flank pain.  History obtained from patient.  No interpreter was used.  HPI  Past Medical History:  Diagnosis Date  . Allergy    rhinitis  . Aortic stenosis    mild by echo 06/2017  . Arthritis   . Bradycardia    a. 10/2017 -> beta blocker cut back due to HR 39.  . Breast cancer ([place]) 01/06/2012  . Cancer [facility])    right colon and left breast  . Chronic diastolic CHF (congestive heart failure) ([place])   . Colon cancer ([place]) 01/06/2012  . COPD (chronic obstructive pulmonary disease) ([place])    pt. denies  . Coronary artery disease 2006   a.  NSTEMI in 2016, cath showed 15% prox-mid RCA, 20% prox LAD, EF 25-35% by cath and 35-40% -> felt due to Takotsubo cardiomyopathy.  . Dilated aortic root ([place])    54mmHg by echo 06/2017  . Diverticulosis   . Dyspnea   . Edema extremities     . GERD (gastroesophageal reflux disease)   . Hernia   . Hiatal hernia    denies  . Hyperlipidemia   . Hypertension   . Mild aortic stenosis    echo 11/2015 but not noted on echo 06/2016  . Osteopenia   . Permanent atrial fibrillation    chronic atrial fibrillation  . Pneumonia    hx child  . Pulmonary HTN ([place])    a. moderate to severe PASP 87mmHg echo 11/2015 - now 43mmHg by echo 06/2017. CTA chest in 11/16 with no PE. PFTs in 7/15 with mild obstructive lung disease. She had a negative sleep study in 2017. b. Felt due to left sided HF.  Marland Kitchen Stroke ([place])   . Takotsubo syndrome 07/29/2015   a. EF 35-40% by echo; akinesis of mid-apical anteroseptal and apical myocardium.  EF now normalized on echo 11/2015    Patient Active Problem List   Diagnosis Date Noted  . TIA (transient ischemic attack) 01/06/2018  . Breast cancer ([place]) 09/13/2017  . Obesity, unspecified 09/13/2017  . Warfarin anticoagulation 09/13/2017  . Uterine mass 09/07/2017  . Adnexal mass 08/03/2017  . Pelvic abscess in female 07/07/2017  . Aortic stenosis   . Dyslipidemia, goal LDL below 70 11/28/2016  . Monitoring for long-term anticoagulant use 12/31/2015  . Chronic diastolic CHF (congestive heart failure) ([place]) 10/28/2015  . Insomnia 10/23/2015  . Leg swelling 08/11/2015  .  Takotsubo syndrome 07/29/2015  . NSTEMI (non-ST elevated myocardial infarction) ([place]) 07/28/2015  . SOB (shortness of breath) 08/15/2014  . Dilated aortic root ([place])   . Pulmonary HTN ([place])   . Permanent atrial fibrillation 07/04/2013  . Chronic anticoagulation 07/04/2013  . Coronary artery disease   . Lesion of nipple 05/15/2013  . GERD (gastroesophageal reflux disease)   . Hypertension   . COPD (chronic obstructive pulmonary disease) ([place])   . Osteopenia   . Diverticulosis   . Hiatal hernia   . Abdominal pain 01/11/2012  . Breast cancer of upper-outer quadrant of left female breast ([place]) 01/06/2012  . Colon cancer ([place]) 01/06/2012     Past Surgical History:  Procedure Laterality Date  . APPENDECTOMY    . BREAST SURGERY     lumpectomy left  . CARDIAC CATHETERIZATION    . CARDIAC CATHETERIZATION N/A 07/28/2015   Procedure: Left Heart Cath and Coronary Angiography;  Surgeon: Peter M Martinique, MD;  Location: [facility];  Service: Cardiovascular;  Laterality: N/A;  . CHOLECYSTECTOMY    . COLECTOMY     right side  . EXCISION OF ACCESSORY NIPPLE Bilateral 05/30/2013   Procedure: BILATERAL NIPPLE BIOPSY;  Surgeon: Merrie Roof, MD;  Location: [facility];  Service: General;  Laterality: Bilateral;  . EYE SURGERY Bilateral 12   cataracts  . IR RADIOLOGIST EVAL & MGMT  07/26/2017  . SPLIT NIGHT STUDY  02/02/2016         OB History    Gravida  2   Para  1   Term  1   Preterm      AB  1   Living  1     SAB  1   TAB      Ectopic      Multiple      Live Births  1            Home Medications    Prior to Admission medications   Medication Sig Start Date End Date Taking? Authorizing Provider  acetaminophen (TYLENOL) 500 MG tablet Take 500-1,000 mg by mouth every 6 (six) hours as needed for headache (pain).    [provider]  amLODipine (NORVASC) 5 MG tablet Take 1 tablet (5 mg total) by mouth daily. 03/28/18   Larey Dresser, MD  apixaban (ELIQUIS) 5 MG TABS tablet Take 1 tablet (5 mg total) by mouth 2 (two) times daily. 05/09/18   [name], MD  cephALEXin (KEFLEX) 500 MG capsule Take 1 capsule (500 mg total) by mouth 2 (two) times daily for 7 days. 07/16/18 07/23/18  Tevis Dunavan A, PA-C  cetirizine (ZYRTEC) 10 MG tablet Take 10 mg by mouth daily as needed for allergies.    [provider]  ciprofloxacin (CIPRO) 250 MG tablet Take 1 tablet (250 mg total) by mouth 2 (two) times daily. 07/14/18   [name], MD  clonazePAM (KLONOPIN) 0.5 MG tablet TAKE 1 TABLET BY MOUTH TWICE DAILY AS NEEDED FOR ANXIETY Patient taking differently: TAKE 1 TABLET (0.5 MG) BY MOUTH  TWICE DAILY AS NEEDED FOR ANXIETY 09/26/17   Dena Billet B, PA-C  cloNIDine (CATAPRES) 0.1 MG tablet Take 1 tablet (0.1 mg total) by mouth 2 (two) times daily. 12/26/17   [name], MD  fexofenadine (ALLEGRA ALLERGY) 180 MG tablet Take 1 tablet (180 mg total) by mouth daily. 03/15/18   Dixon, Lonie Peak, PA-C  Guaifenesin (MUCINEX MAXIMUM STRENGTH) 1200 MG TB12 Take 1,200 mg  by mouth daily as needed (congestion).    [provider]  losartan (COZAAR) 100 MG tablet TAKE 1 TABLET BY MOUTH ONCE DAILY 07/03/18   Larey Dresser, MD  metoprolol succinate (TOPROL XL) 25 MG 24 hr tablet Take 1 tablet (25 mg total) by mouth daily. 03/28/18   [name], MD  mirtazapine (REMERON) 15 MG tablet TAKE 1 TABLET BY MOUTH AT BEDTIME 05/10/18   Dixon, Stanton Kidney B, PA-C  nitroGLYCERIN (NITROSTAT) 0.4 MG SL tablet PLACE ONE TABLET UNDER THE TONGUE EVERY FIVE MINUTES AS NEEDED FOR CHEST PAIN. [place] REPEAT FOR THREE DOSES. 09/01/17   Orlena Sheldon, PA-C  omeprazole (PRILOSEC OTC) 20 MG tablet Take 20 mg by mouth daily with supper.    [provider]  ondansetron (ZOFRAN ODT) 4 MG disintegrating tablet Take 1 tablet (4 mg total) by mouth every 8 (eight) hours as needed for nausea or vomiting. 12/26/17   Upstill, Nehemiah Settle, PA-C  OVER THE COUNTER MEDICATION Place 1 drop into both eyes daily as needed (dry eyes). Over the counter lubricating eye drops    [provider]  spironolactone (ALDACTONE) 25 MG tablet TAKE 1/2 (ONE-HALF) TABLET BY MOUTH ONCE DAILY 12/16/17   [name], MD  torsemide (DEMADEX) 20 MG tablet Take 3 tablets (60 mg total) by mouth daily. 03/21/18   [name], MD    Family History Family History  Problem Relation Age of Onset  . Heart disease Mother   . Heart attack Mother   . Cancer Sister        stomach and colon  . Heart disease Brother 43  . Hypertension Father   . Cancer Sister   . Stroke Neg Hx     Social History Social History   Tobacco Use  . Smoking  status: Never Smoker  . Smokeless tobacco: Never Used  Substance Use Topics  . Alcohol use: No  . Drug use: No     Allergies   Contrast media [iodinated diagnostic agents]; Clonidine derivatives; Statins; Sulfa antibiotics; Celebrex [celecoxib]; Isosorbide nitrate; Other; and Tape   Review of Systems Review of Systems  Constitutional: Negative.   Respiratory: Negative for cough, choking, chest tightness, shortness of breath and stridor.   Cardiovascular: Negative for chest pain and leg swelling.  Gastrointestinal: Negative for abdominal distention, abdominal pain, anal bleeding, blood in stool, constipation, diarrhea, nausea, rectal pain and vomiting.  Genitourinary: Negative for difficulty urinating, dysuria, enuresis and flank pain.  Musculoskeletal:       Chronic low back pain.  Skin: Negative.   Neurological: Negative for dizziness, weakness, light-headedness and numbness.  All other systems reviewed and are negative.    Physical Exam Updated Vital Signs BP (!) 145/55 (BP Location: Right Arm)   Pulse 67   Temp 99.4 F (37.4 C) (Oral)   Resp 16   Ht 5\' 6"  (1.676 m)   Wt 89.8 kg   SpO2 94%   BMI 31.96 kg/m   Physical Exam  Constitutional: Vital signs are normal. She appears well-developed and well-nourished.  Non-toxic appearance. She does not have a sickly appearance. She does not appear ill. No distress.  HENT:  Head: Atraumatic.  Mouth/Throat: Oropharynx is clear and moist.  Eyes: Pupils are equal, round, and reactive to light.  Neck: Normal range of motion.  Cardiovascular: Normal rate and intact distal pulses. Exam reveals no gallop and no friction rub.  Pulmonary/Chest: Effort normal and breath sounds normal. No stridor. No respiratory distress. She has  no wheezes. She has no rales.  Abdominal: Soft. Normal appearance and bowel sounds are normal. She exhibits no distension and no mass. There is no hepatosplenomegaly. There is tenderness in the suprapubic area.  There is no rigidity, no rebound, no guarding, no CVA tenderness, no tenderness at McBurney's point and negative Murphy's sign.  Musculoskeletal: Normal range of motion.       Thoracic back: Normal.       Lumbar back: Normal.  Neurological: She is alert.  Skin: Skin is warm and dry. She is not diaphoretic.  Bilateral lower extremity 1+ pitting edema.  She states this is at her baseline.  No erythema, redness or warmth.  Psychiatric: She has a normal mood and affect.  Nursing note and vitals reviewed.    ED Treatments / Results  Labs (all labs ordered are listed, but only abnormal results are displayed) Labs Reviewed  CBC WITH DIFFERENTIAL/PLATELET - Abnormal; Notable for the following components:      Result Value   RBC 3.80 (*)    Hemoglobin 10.6 (*)    HCT 35.3 (*)    All other components within normal limits  COMPREHENSIVE METABOLIC PANEL - Abnormal; Notable for the following components:   Sodium 134 (*)    Potassium 3.2 (*)    Glucose, Bld 141 (*)    Creatinine, Ser 1.14 (*)    Calcium 8.8 (*)    Albumin 3.4 (*)    GFR calc non Af Amer 44 (*)    GFR calc Af Amer 51 (*)    All other components within normal limits  URINALYSIS, ROUTINE W REFLEX MICROSCOPIC - Abnormal; Notable for the following components:   APPearance CLOUDY (*)    Hgb urine dipstick LARGE (*)    Protein, ur 30 (*)    Leukocytes, UA LARGE (*)    RBC / HPF >50 (*)    WBC, UA >50 (*)    Bacteria, UA RARE (*)    All other components within normal limits  URINE CULTURE  LIPASE, BLOOD    EKG None  Radiology Ct Renal Stone Study  Result Date: 07/16/2018 CLINICAL DATA:  Hematuria this morning.  Chronic back pain. EXAM: CT ABDOMEN AND PELVIS WITHOUT CONTRAST TECHNIQUE: Multidetector CT imaging of the abdomen and pelvis was performed following the standard protocol without IV contrast. COMPARISON:  03/01/2018 FINDINGS: Lower chest: No acute findings. Hepatobiliary: No focal liver abnormality is seen.  Status post cholecystectomy. No biliary dilatation. Pancreas: Partial fatty replacement.  No mass or inflammation. Spleen: Numerous calcifications consistent with healed granuloma. Normal in size. No masses. Adrenals/Urinary Tract: No adrenal masses. Kidneys normal size, orientation and position. Small low-density lesion, posterior midpole left kidney, consistent with a cyst, stable from the prior study. No other renal masses. No stones. No hydronephrosis. Ureters are normal in course and in caliber. There is nondependent air in the bladder. There is abnormal soft tissue that is contiguous with the left bladder wall. There is a bubble of air within the soft tissue and a subtle tract that extends to the left adnexa and adjacent sigmoid colon. Patient had a collection contiguous with the bladder in this location on the prior CT. 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 No other bladder abnormality. Stomach/Bowel: There are numerous colonic diverticula. No evidence of diverticulitis. There has been a previous right hemicolectomy. Anastomosis staple line is noted in the right mid abdomen. No colonic wall thickening or inflammation. Stomach and small bowel are unremarkable.  Vascular/Lymphatic: Aortic atherosclerosis. No enlarged abdominal or pelvic lymph nodes. Reproductive: Uterus and bilateral adnexa are unremarkable. Other: No abdominal wall hernia or abnormality. No abdominopelvic ascites. Musculoskeletal: Fracture and large Schmorl's node at L1, stable. No acute fracture. No osteoblastic or osteolytic lesions. IMPRESSION: 1. There is nondependent air within the bladder and abnormal soft tissue along the left bladder wall with a small bubble of air within the soft tissue. There is evidence of a subtle fistula from this abnormal soft tissue to the left adnexa and adjacent sigmoid colon consistent with a colovesicular fistula. This has developed in the location of a  previously seen small abscess/collection. This finding accounts for the patient's hematuria. 2. No other evidence of an acute abnormality. 3. Multiple colonic diverticula without diverticulitis. Status post right hemicolectomy. 4. Aortic atherosclerosis. Electronically Signed   By: Lajean Manes M.D.   On: 07/16/2018 14:17    Procedures Procedures (including critical care time)  Medications Ordered in ED Medications  potassium chloride SA (K-DUR,KLOR-CON) CR tablet 40 mEq (40 mEq Oral Given 07/16/18 1427)     Initial Impression / Assessment and Plan / ED Course  I have reviewed the triage vital signs and the nursing notes.  Pertinent labs & imaging results that were available during my care of the patient were reviewed by me and considered in my medical decision making (see chart for details).  83 year old female who appears otherwise well presents for evaluation of hematuria.  Afebrile, nonseptic, non-ill-appearing.  Patient was seen by her PCP on Friday for hematuria and diagnosed with urinary tract infection. Started on Cipro at that time.  Patient states her hematuria was not present on Saturday, however when she woke up this morning she noticed bright red blood in her urine.  Denies fever, chills, nausea, vomiting, abdominal pain or CVA tenderness.  She does admit to chronic low back pain which has remained unchanged from her chronic pain.  Mild suprapubic tenderness to palpation.  No CVA tenderness.  Will obtain labs, urine, urine culture and CT scan to r/o pyelonephritis and reevaluate.  She appears overall well-appearing.  Labs without leukocytosis, Potassium 3.2, will provide supplementation in ED, urinalysis with large leukocytes, large Hgb, > 50 WBC. CT with colovesicular fistula.  Will consult general surgery.  Review of her past medical records it seems she had a possible fistula approximately 3 months ago.  Patient was seen by Dr. Marlou Starks, general surgery prior to that for sigmoid  inflammation and scheduled for a possible colectomy.  Per patient, she was not a surgical candidate and has not followed up with general surgery since.  Consulted with Dr. Rosendo Gros, general surgery.  He recommends patient follow-up in office this week.  Discussed with patient strict return precautions.  Discussed antibiotics for her urinary tract infection.  Recommendations of switching to Keflex for her UTI.  Patient is hemodynamically stable.  Patient and husband voiced return precautions and are agreeable for follow-up.   Final Clinical Impressions(s) / ED Diagnoses   Final diagnoses:  Acute cystitis with hematuria    ED Discharge Orders         Ordered    cephALEXin (KEFLEX) 500 MG capsule  2 times daily     07/16/18 1530           Erling Arrazola A, PA-C 07/16/18 1544    Malvin Johns, MD 07/16/18 1605

## 2018-07-16 NOTE — ED Notes (Signed)
ED Provider at bedside. 

## 2018-07-16 NOTE — ED Triage Notes (Signed)
Pt to ED for evaluation of blood in urine this morning. Pt sts she went to her doctor on Friday and he told her blood was coming from her kidneys. Denies n/v, fever, chills. Pt endorses back pain that is chronic. Denies any other urinary symptoms. No blood in stool.

## 2018-07-16 NOTE — Discharge Instructions (Addendum)
You were evaluated today for hematuria.  You do have a urinary tract infection.  CT scan did show a tunneling between the bladder and your colon. You will need to follow-up with them in the office this week. Please stop taking the Ciprofloxacin you were prescribed. I have prescribed you Keflex. Please take twice a day.  Please return to the Ed with any new or worsening symptoms such as:  Contact a doctor if:  You have back pain. You have a fever. You feel sick to your stomach (nauseous). You throw up (vomit). Your symptoms do not get better after 3 days. Your symptoms go away and then come back. Get help right away if: You have very bad back pain. You have very bad lower belly (abdominal) pain. You are throwing up and cannot keep down any medicines or water.

## 2018-07-16 NOTE — ED Notes (Signed)
Patient transported to CT 

## 2018-07-19 LAB — URINE CULTURE: Culture: 30000 — AB

## 2018-07-20 ENCOUNTER — Telehealth: Payer: Self-pay | Admitting: *Deleted

## 2018-07-20 DIAGNOSIS — K5732 Diverticulitis of large intestine without perforation or abscess without bleeding: Secondary | ICD-10-CM | POA: Diagnosis not present

## 2018-07-20 NOTE — Telephone Encounter (Signed)
Post ED Visit - Positive Culture Follow-up: Successful Patient Follow-Up  Culture assessed and recommendations reviewed by:  []  Elenor Quinones, Pharm.D. []  Heide Guile, Pharm.D., BCPS AQ-ID []  Parks Neptune, Pharm.D., BCPS []  Alycia Rossetti, Pharm.D., BCPS []  [place], [place].D., BCPS, AAHIVP []  Legrand Como, Pharm.D., BCPS, AAHIVP []  Salome Arnt, PharmD, BCPS []  Johnnette Gourd, PharmD, BCPS []  Hughes Better, PharmD, BCPS []  Leeroy Cha, PharmD  Positive urine culture  []  Patient discharged without antimicrobial prescription and treatment is now indicated [x]  Organism is resistant to prescribed ED discharge antimicrobial []  Patient with positive blood cultures  Changes discussed with ED provider Benedetto Goad, PA-C New antibiotic prescription Amoxicillin 875mg  q12h x 5 days, Stop Keflex Called to [facility], [phone]  Contacted patient, date 07/20/2018, time [place], [place] 07/20/2018, 9:52 AM

## 2018-07-20 NOTE — Progress Notes (Signed)
ED Antimicrobial Stewardship Positive Culture Follow Up   [name] is an 83 y.o. female who presented to [facility] on 07/16/2018 with a chief complaint of  Chief Complaint  Patient presents with  . Hematuria    Recent Results (from the past 720 hour(s))  Urine Culture     Status: None   Collection Time: 07/14/18 10:50 AM  Result Value Ref Range Status   MICRO NUMBER: [id number]  Final   SPECIMEN QUALITY: ADEQUATE  Final   Sample Source URINE  Final   STATUS: FINAL  Final   Result:   Final    Single organism less than 10,000 CFU/mL isolated. These organisms, commonly found on external and internal genitalia, are considered colonizers. No further testing performed.  Urine culture     Status: Abnormal   Collection Time: 07/16/18 12:29 PM  Result Value Ref Range Status   Specimen Description URINE, CLEAN CATCH  Final   Special Requests   Final    NONE Performed at [facility], [place] [address], [place], [place] [address]    Culture 30,000 COLONIES/mL ENTEROCOCCUS FAECALIS (A)  Final   Report Status 07/19/2018 FINAL  Final   Organism ID, Bacteria ENTEROCOCCUS FAECALIS (A)  Final      Susceptibility   Enterococcus faecalis - MIC*    AMPICILLIN <=2 SENSITIVE Sensitive     LEVOFLOXACIN >=8 RESISTANT Resistant     NITROFURANTOIN <=16 SENSITIVE Sensitive     VANCOMYCIN 1 SENSITIVE Sensitive     * 30,000 COLONIES/mL ENTEROCOCCUS FAECALIS    [x]  Treated with Cephalexin, organism resistant to prescribed antimicrobial  New antibiotic prescription: Amoxicillin 875mg  PO every 12 hours for 5 days  ED Provider: Benedetto Goad, PA-C   Thank you for allowing pharmacy to be a part of this patient's care.  Tamela Gammon, PharmD 07/20/2018 9:31 AM PGY-1 Pharmacy Resident Monday - Friday phone -  [phone] Saturday - Sunday phone - [phone]

## 2018-07-21 ENCOUNTER — Telehealth: Payer: Self-pay | Admitting: *Deleted

## 2018-07-21 NOTE — Telephone Encounter (Signed)
   [facility] Pre-operative Risk Assessment    Request for surgical clearance:  1. What type of surgery is being performed? COLECTOMY   2. When is this surgery scheduled? TBD   3. Are there any medications that need to be held prior to surgery and how long? ELIQUIS   4. Practice name and name of physician performing surgery? [facility]; DR TOTH   5. What is your office phone and fax number? P# [phone] F# [phone]   2. Anesthesia type (None, local, MAC, general) ? GENERAL   [name] 07/21/2018, 8:45 AM  _________________________________________________________________   (provider comments below)

## 2018-07-21 NOTE — Telephone Encounter (Signed)
Pt has appt 08/08/18 with Ermalinda Barrios, PA for pre op clearance . I will remove from the pool.

## 2018-07-21 NOTE — Telephone Encounter (Signed)
Pt takes Eliquis for afib with CHADS2VASc score of 8 (age x2, sex, CHF, HTN, CAD, TIA). CrCl is 66mL/min. Recommend only holding Eliquis for 1 day prior to procedure due to elevated cardiac risk including history of TIA.

## 2018-07-21 NOTE — Telephone Encounter (Signed)
Pharm please address eliquis 

## 2018-07-21 NOTE — Telephone Encounter (Signed)
   Primary Cardiologist:Traci Turner, MD  Chart reviewed as part of pre-operative protocol coverage. Because of [name]'s past medical history and time since last visit, he/she will require a follow-up visit in order to better assess preoperative cardiovascular risk.  Pre-op covering staff: - Please make sure pt knows to keep appt with Ms. Bonnell Public on 08/08/18.  - Please contact requesting surgeon's office via preferred method (i.e, phone, fax) to inform them of need for appointment prior to surgery.  If applicable, this message will also be routed to pharmacy pool and/or primary cardiologist for input on holding anticoagulant/antiplatelet agent as requested below so that this information is available at time of patient's appointment.   Cecilie Kicks, NP  07/21/2018, 9:13 AM

## 2018-07-26 ENCOUNTER — Other Ambulatory Visit (HOSPITAL_COMMUNITY): Payer: Self-pay | Admitting: Cardiology

## 2018-07-29 ENCOUNTER — Other Ambulatory Visit (HOSPITAL_COMMUNITY): Payer: Self-pay | Admitting: Cardiology

## 2018-08-02 DIAGNOSIS — L309 Dermatitis, unspecified: Secondary | ICD-10-CM | POA: Diagnosis not present

## 2018-08-02 DIAGNOSIS — L989 Disorder of the skin and subcutaneous tissue, unspecified: Secondary | ICD-10-CM | POA: Diagnosis not present

## 2018-08-07 ENCOUNTER — Ambulatory Visit: Payer: Medicare Other | Admitting: Physician Assistant

## 2018-08-07 DIAGNOSIS — Z01818 Encounter for other preprocedural examination: Secondary | ICD-10-CM | POA: Insufficient documentation

## 2018-08-07 NOTE — Progress Notes (Signed)
Cardiology Office Note    Date:  08/08/2018   ID:  [name], DOB July 05, 1935, MRN [id number]  PCP:  Orlena Sheldon, PA-C  Cardiologist: Fransico Him, MD EPS: None  Chief Complaint  Patient presents with  . Pre-op Exam    History of Present Illness:  [name] is a 83 y.o. female with history of Takotsubo cardiomyopathy LVEF 35 to 40% 07/2015 normalized LV function 11/2015.  Minimal CAD on cath in 2016 20% LAD, 15% proximal to mid RCA,, hypertension, chronic diastolic CHF, moderate pulmonary hypertension with chronic dyspnea on exertion secondary to pulmonary venous hypertension and left-sided heart failure followed in advanced heart failure clinic, permanent atrial fibrillation with chads vas score of 8.  Recurrent chest pain with normal nuclear stress test 02/14/2018.  Patient last saw Dr. [name] 02/06/2018 at which time she had recurrent chest pain with normal nuclear stress test LVEF 63% 02/14/2018.  Plan for follow-up on ascending aortic of 39 mm with follow-up echo 06/2018 which has not been done yet.  Here for cardiac clearance before undergoing colectomy. Pharmacy says she can only hold eliquis for 1 day.Patient says she has an occasional fluttering in her chest. Occasional twinge at rest.No chest tightness or pressure.Taking care of her husband who just had back surgery. No regular exercise. Denies dyspnea, edema, dizziness or presyncope.  Patient has chronic dyspnea on exertion that she has had for years.  She says she cannot walk up a hill and has trouble doing the weed eating before she has to sit down.   Past Medical History:  Diagnosis Date  . Allergy    rhinitis  . Aortic stenosis    mild by echo 06/2017  . Arthritis   . Bradycardia    a. 10/2017 -> beta blocker cut back due to HR 39.  . Breast cancer ([place]) 01/06/2012  . Cancer [facility])    right colon and left breast  . Chronic diastolic CHF (congestive heart failure) ([place])   . Colon cancer ([place]) 01/06/2012  . COPD (chronic  obstructive pulmonary disease) ([place])    pt. denies  . Coronary artery disease 2006   a.  NSTEMI in 2016, cath showed 15% prox-mid RCA, 20% prox LAD, EF 25-35% by cath and 35-40% -> felt due to Takotsubo cardiomyopathy.  . Dilated aortic root ([place])    65mmHg by echo 06/2017  . Diverticulosis   . Dyspnea   . Edema extremities   . GERD (gastroesophageal reflux disease)   . Hernia   . Hiatal hernia    denies  . Hyperlipidemia   . Hypertension   . Mild aortic stenosis    echo 11/2015 but not noted on echo 06/2016  . Osteopenia   . Permanent atrial fibrillation    chronic atrial fibrillation  . Pneumonia    hx child  . Pulmonary HTN ([place])    a. moderate to severe PASP 33mmHg echo 11/2015 - now 24mmHg by echo 06/2017. CTA chest in 11/16 with no PE. PFTs in 7/15 with mild obstructive lung disease. She had a negative sleep study in 2017. b. Felt due to left sided HF.  Marland Kitchen Stroke ([place])   . Takotsubo syndrome 07/29/2015   a. EF 35-40% by echo; akinesis of mid-apical anteroseptal and apical myocardium.  EF now normalized on echo 11/2015    Past Surgical History:  Procedure Laterality Date  . APPENDECTOMY    . BREAST SURGERY     lumpectomy left  . CARDIAC CATHETERIZATION    .  CARDIAC CATHETERIZATION N/A 07/28/2015   Procedure: Left Heart Cath and Coronary Angiography;  Surgeon: Peter M Martinique, MD;  Location: [facility];  Service: Cardiovascular;  Laterality: N/A;  . CHOLECYSTECTOMY    . COLECTOMY     right side  . EXCISION OF ACCESSORY NIPPLE Bilateral 05/30/2013   Procedure: BILATERAL NIPPLE BIOPSY;  Surgeon: Merrie Roof, MD;  Location: [facility];  Service: General;  Laterality: Bilateral;  . EYE SURGERY Bilateral 12   cataracts  . IR RADIOLOGIST EVAL & MGMT  07/26/2017  . SPLIT NIGHT STUDY  02/02/2016        Current Medications: Current Meds  Medication Sig  . acetaminophen (TYLENOL) 500 MG tablet Take 500-1,000 mg by mouth every 6 (six) hours as needed for headache (pain).    Marland Kitchen amLODipine (NORVASC) 5 MG tablet TAKE 1 TABLET BY MOUTH ONCE DAILY  . amoxicillin (AMOXIL) 500 MG tablet Take 500 mg by mouth 2 (two) times daily. Taking until her colon operation due in December 2019  . apixaban (ELIQUIS) 5 MG TABS tablet Take 1 tablet (5 mg total) by mouth 2 (two) times daily.  Marland Kitchen augmented betamethasone dipropionate (DIPROLENE-AF) 0.05 % ointment APPLY TO AFFECTED AREAS TWICE DAILY AS NEEDED  . clonazePAM (KLONOPIN) 0.5 MG tablet TAKE 1 TABLET BY MOUTH TWICE DAILY AS NEEDED FOR ANXIETY (Patient taking differently: TAKE 1 TABLET (0.5 MG) BY MOUTH TWICE DAILY AS NEEDED FOR ANXIETY)  . cloNIDine (CATAPRES) 0.1 MG tablet Take 1 tablet (0.1 mg total) by mouth 2 (two) times daily.  . Guaifenesin (MUCINEX MAXIMUM STRENGTH) 1200 MG TB12 Take 1,200 mg by mouth daily as needed (congestion).  Marland Kitchen losartan (COZAAR) 100 MG tablet TAKE 1 TABLET BY MOUTH ONCE DAILY  . metoprolol succinate (TOPROL XL) 25 MG 24 hr tablet Take 1 tablet (25 mg total) by mouth daily.  . metroNIDAZOLE (FLAGYL) 500 MG tablet TAKE 2 TABLETS BY MOUTH AT 2 PM 3 PM AND 10 PM THE DAY PRIOR TO YOUR COLON OPERATION.  . mirtazapine (REMERON) 15 MG tablet TAKE 1 TABLET BY MOUTH AT BEDTIME  . neomycin (MYCIFRADIN) 500 MG tablet TAKE 2 TABLETS BY MOUTH AT 2 PM 3 PM AND 10 PM THE DAY PRIOR TO SURGERY.  . nitroGLYCERIN (NITROSTAT) 0.4 MG SL tablet PLACE ONE TABLET UNDER THE TONGUE EVERY FIVE MINUTES AS NEEDED FOR CHEST PAIN. [place] REPEAT FOR THREE DOSES.  Marland Kitchen omeprazole (PRILOSEC OTC) 20 MG tablet Take 20 mg by mouth daily with supper.  . ondansetron (ZOFRAN ODT) 4 MG disintegrating tablet Take 1 tablet (4 mg total) by mouth every 8 (eight) hours as needed for nausea or vomiting.  Marland Kitchen OVER THE COUNTER MEDICATION Place 1 drop into both eyes daily as needed (dry eyes). Over the counter lubricating eye drops  . spironolactone (ALDACTONE) 25 MG tablet TAKE 1/2 (ONE-HALF) TABLET BY MOUTH ONCE DAILY  . torsemide (DEMADEX) 20 MG tablet Take 3  tablets (60 mg total) by mouth daily.     Allergies:   Contrast media [iodinated diagnostic agents]; Clonidine derivatives; Losartan; Statins; Sulfa antibiotics; Celebrex [celecoxib]; Isosorbide nitrate; Other; and Tape   Social History   Socioeconomic History  . Marital status: Married    Spouse name: Not on file  . Number of children: Not on file  . Years of education: Not on file  . Highest education level: Not on file  Occupational History  . Not on file  Social Needs  . Financial resource strain: Not on file  .  Food insecurity:    Worry: Not on file    Inability: Not on file  . Transportation needs:    Medical: Not on file    Non-medical: Not on file  Tobacco Use  . Smoking status: Never Smoker  . Smokeless tobacco: Never Used  Substance and Sexual Activity  . Alcohol use: No  . Drug use: No  . Sexual activity: Not Currently  Lifestyle  . Physical activity:    Days per week: Not on file    Minutes per session: Not on file  . Stress: Not on file  Relationships  . Social connections:    Talks on phone: Not on file    Gets together: Not on file    Attends religious service: Not on file    Active member of club or organization: Not on file    Attends meetings of clubs or organizations: Not on file    Relationship status: Not on file  Other Topics Concern  . Not on file  Social History Narrative  . Not on file     Family History:  The patient's family history includes Cancer in her sister and sister; Heart attack in her mother; Heart disease in her mother; Heart disease (age of onset: 42) in her brother; Hypertension in her father.   ROS:   Please see the history of present illness.    Review of Systems  Constitution: Negative.  HENT: Negative.   Eyes: Negative.   Cardiovascular: Positive for dyspnea on exertion, irregular heartbeat and palpitations.  Respiratory: Negative.   Hematologic/Lymphatic: Bruises/bleeds easily.  Musculoskeletal: Positive for back  pain. Negative for joint pain.  Gastrointestinal: Negative.   Genitourinary: Negative.   Neurological: Negative.    All other systems reviewed and are negative.   PHYSICAL EXAM:   VS:  BP (!) 140/46   Pulse (!) 59   Ht 5\' 6"  (1.676 m)   Wt 190 lb 9.6 oz (86.5 kg)   SpO2 95%   BMI 30.76 kg/m   Physical Exam  GEN: Well nourished, well developed, in no acute distress  Neck: no JVD, carotid bruits, or masses Cardiac:RRR; 2/6 systolic murmur at the left sternal border Respiratory:  clear to auscultation bilaterally, normal work of breathing GI: soft, nontender, nondistended, + BS Ext: Trace of ankle edema otherwise without cyanosis, clubbing, Good distal pulses bilaterally Neuro:  Alert and Oriented x 3 Psych: euthymic mood, full affect  Wt Readings from Last 3 Encounters:  08/08/18 190 lb 9.6 oz (86.5 kg)  07/16/18 198 lb (89.8 kg)  07/14/18 198 lb (89.8 kg)      Studies/Labs Reviewed:   EKG:  EKG is not ordered today.  The ekg reviewed from 12/25/2017 shows atrial fibrillation with poor R wave progression anteriorly and nonspecific ST-T wave changes.  Unchanged from prior tracings. Recent Labs: 07/16/2018: ALT 14; BUN 15; Creatinine, Ser 1.14; Hemoglobin 10.6; Platelets 257; Potassium 3.2; Sodium 134   Lipid Panel    Component Value Date/Time   CHOL 145 02/14/2018 0927   TRIG 65 02/14/2018 0927   HDL 51 02/14/2018 0927   CHOLHDL 2.8 02/14/2018 0927   CHOLHDL 3.5 03/15/2016 1057   VLDL 23 03/15/2016 1057   LDLCALC 81 02/14/2018 0927    Additional studies/ records that were reviewed today include:  NST 6/2019Study Highlights    Nuclear stress EF: 63%. The left ventricular ejection fraction is normal (55-65%).  This is a low risk study.  The study is normal. There is no evidence  of infarction or ischemia.      2D echo 10/2018Study Conclusions   - Left ventricle: The cavity size was normal. Wall thickness was   increased in a pattern of mild LVH. Systolic  function was normal.   The estimated ejection fraction was in the range of 60% to 65%.   Wall motion was normal; there were no regional wall motion   abnormalities. The study is not technically sufficient to allow   evaluation of LV diastolic function. - Aortic valve: Calcified leaflets. There is mild stenosis. Trivial   AI. Mean gradient (S): 11 mm Hg. Peak gradient (S): 20 mm Hg.   Valve area (VTI): 1.54 cm^2. Valve area (Vmax): 1.66 cm^2. Valve   area (Vmean): 1.59 cm^2. - Aorta: Ascending aorta: 39 mm (ED) - Ascending aorta: The ascending aorta was mildly dilated. - Mitral valve: Mildly thickened leaflets . There was trivial   regurgitation. - Left atrium: The atrium was mildly dilated. - Right atrium: Severely dilated. - Tricuspid valve: There was moderate regurgitation. - Pulmonary arteries: PA peak pressure: 41 mm Hg (S). - Inferior vena cava: The vessel was dilated. The respirophasic   diameter changes were in the normal range (= 50%), consistent   with normal central venous pressure.   Impressions:   - Compared to a prior study in 2017, the LVEF is higher at 60-65%.   The RVSP is lower at 41 mmHg (average). A-fib is present. There   is severe RAE and mild LAE and moderate TR. The aortic root is   normal size, however, the most distally visualized portion of the   ascending aorta is dilated, measuring 3.9 cm.   --------------------------------  Cardiac catheterization 2016Prox RCA to Mid RCA lesion, 15% stenosed.  Prox LAD lesion, 20% stenosed.  There is severe left ventricular systolic dysfunction.   1. Minor nonobstructive CAD 2. Severe left ventriclar dysfunction. Wall motion abnormality consistent with Takotsubo cardiomyopathy.   Plan: medical management.        ASSESSMENT:    1. Preoperative clearance   2. Takotsubo syndrome   3. Permanent atrial fibrillation   4. Dilated aortic root ([place])   5. Pulmonary HTN ([place])   6. Chronic diastolic CHF  (congestive heart failure) (HCC)      PLAN:  In order of problems listed above:  Preoperative clearance for colectomy with [place] surgery, Dr. Marlou Starks 09/04/2018.  Pharmacy recommended holding Eliquis only for 1 day prior to procedure due to elevated cardiac risk including history of TIA.  Chads vas score equals 8.  If she needs to be off Eliquis longer than this she will need a Lovenox bridge.  Patient has an increased perioperative risk of major cardiac event based on her prior cardiac history.  She does have permanent atrial fibrillation.  History of Takotsubo cardiomyopathy but most recent nuclear stress test 02/2018 was normal with normal LV function.  We will order 2D echo to reassess her aortic root but unless anything unusual comes back she can proceed with the necessary surgery without any further cardiac testing but accepting the increased risk.Reviewed with Dr. [name] who concurs.  According to the Revised Cardiac Risk Index (RCRI), her Perioperative Risk of Major Cardiac Event is (%): 11  Her Functional Capacity in METs is: 4.4 according to the Duke Activity Status Index (DASI).    Takotsubo cardiomyopathy in 2016 with normalization of LV function.  Cardiac cath 2016 nonobstructive CAD see above.  Recent chest pain and normal nuclear stress test 02/2018.  Permanent atrial fibrillation on Eliquis CHA2DS2-VASc equals 8  Dilated aortic root at 39 mm on echo last year.  Due for repeat-we will schedule  Pulmonary hypertension blood pressure has been well controlled  Chronic diastolic CHF well compensated.  Will order another pair of compression stockings for her.    Medication Adjustments/Labs and Tests Ordered: Current medicines are reviewed at length with the patient today.  Concerns regarding medicines are outlined above.  Medication changes, Labs and Tests ordered today are listed in the Patient Instructions below. There are no Patient Instructions on file for this visit.    Sumner Boast, PA-C  08/08/2018 9:28 AM    [facility] [place], [place], [place]  [address] Phone: [phone]; Fax: [phone]

## 2018-08-08 ENCOUNTER — Ambulatory Visit: Payer: Medicare Other | Admitting: Physician Assistant

## 2018-08-08 ENCOUNTER — Encounter: Payer: Self-pay | Admitting: Physician Assistant

## 2018-08-08 VITALS — BP 140/46 | HR 59 | Ht 66.0 in | Wt 190.6 lb

## 2018-08-08 DIAGNOSIS — R8271 Bacteriuria: Secondary | ICD-10-CM | POA: Diagnosis not present

## 2018-08-08 DIAGNOSIS — N321 Vesicointestinal fistula: Secondary | ICD-10-CM | POA: Diagnosis not present

## 2018-08-08 DIAGNOSIS — I77810 Thoracic aortic ectasia: Secondary | ICD-10-CM

## 2018-08-08 DIAGNOSIS — I5181 Takotsubo syndrome: Secondary | ICD-10-CM | POA: Diagnosis not present

## 2018-08-08 DIAGNOSIS — Z01818 Encounter for other preprocedural examination: Secondary | ICD-10-CM | POA: Diagnosis not present

## 2018-08-08 DIAGNOSIS — I272 Pulmonary hypertension, unspecified: Secondary | ICD-10-CM

## 2018-08-08 DIAGNOSIS — I4821 Permanent atrial fibrillation: Secondary | ICD-10-CM | POA: Diagnosis not present

## 2018-08-08 DIAGNOSIS — I5032 Chronic diastolic (congestive) heart failure: Secondary | ICD-10-CM

## 2018-08-08 NOTE — Patient Instructions (Signed)
Medication Instructions:  Your physician recommends that you continue on your current medications as directed. Please refer to the Current Medication list given to you today.  If you need a refill on your cardiac medications before your next appointment, please call your pharmacy.   Lab work: None Ordered  If you have labs (blood work) drawn today and your tests are completely normal, you will receive your results only by: Marland Kitchen MyChart Message (if you have MyChart) OR . A paper copy in the mail If you have any lab test that is abnormal or we need to change your treatment, we will call you to review the results.  Testing/Procedures: Your physician has requested that you have an echocardiogram. Echocardiography is a painless test that uses sound waves to create images of your heart. It provides your doctor with information about the size and shape of your heart and how well your heart's chambers and valves are working. This procedure takes approximately one hour. There are no restrictions for this procedure.  Follow-Up: . Keep follow up with Dr. [name] on 09/28/18 at 11:00 AM  Any Other Special Instructions Will Be Listed Below (If Applicable).  Wear compression stockings. You can get these from: [employer] [place], [place] [address]   Echocardiogram An echocardiogram, or echocardiography, uses sound waves (ultrasound) to produce an image of your heart. The echocardiogram is simple, painless, obtained within a short period of time, and offers valuable information to your health care provider. The images from an echocardiogram can provide information such as:  Evidence of coronary artery disease (CAD).  Heart size.  Heart muscle function.  Heart valve function.  Aneurysm detection.  Evidence of a past heart attack.  Fluid buildup around the heart.  Heart muscle thickening.  Assess heart valve function.  Tell a health care provider about:  Any  allergies you have.  All medicines you are taking, including vitamins, herbs, eye drops, creams, and over-the-counter medicines.  Any problems you or family members have had with anesthetic medicines.  Any blood disorders you have.  Any surgeries you have had.  Any medical conditions you have.  Whether you are pregnant or [place] be pregnant. What happens before the procedure? No special preparation is needed. Eat and drink normally. What happens during the procedure?  In order to produce an image of your heart, gel will be applied to your chest and a wand-like tool (transducer) will be moved over your chest. The gel will help transmit the sound waves from the transducer. The sound waves will harmlessly bounce off your heart to allow the heart images to be captured in real-time motion. These images will then be recorded.  You [place] need an IV to receive a medicine that improves the quality of the pictures. What happens after the procedure? You [place] return to your normal schedule including diet, activities, and medicines, unless your health care provider tells you otherwise. This information is not intended to replace advice given to you by your health care provider. Make sure you discuss any questions you have with your health care provider. Document Released: 08/20/2000 Document Revised: 04/10/2016 Document Reviewed: 04/30/2013 Elsevier Interactive Patient Education  2017 [employer].

## 2018-08-10 ENCOUNTER — Telehealth: Payer: Self-pay | Admitting: Physician Assistant

## 2018-08-10 MED ORDER — NITROGLYCERIN 0.4 MG SL SUBL: SUBLINGUAL_TABLET | SUBLINGUAL | 2 refills | Status: DC

## 2018-08-10 NOTE — Telephone Encounter (Signed)
Medication called/sent to requested pharmacy  

## 2018-08-10 NOTE — Telephone Encounter (Signed)
Patient calling to get refill on her nitroglycerin [facility]

## 2018-08-11 ENCOUNTER — Other Ambulatory Visit: Payer: Self-pay | Admitting: Urology

## 2018-08-15 ENCOUNTER — Encounter: Payer: Self-pay | Admitting: Family Medicine

## 2018-08-15 ENCOUNTER — Other Ambulatory Visit: Payer: Self-pay | Admitting: Urology

## 2018-08-15 DIAGNOSIS — N321 Vesicointestinal fistula: Secondary | ICD-10-CM | POA: Insufficient documentation

## 2018-08-21 ENCOUNTER — Ambulatory Visit: Payer: Self-pay | Admitting: General Surgery

## 2018-08-21 ENCOUNTER — Other Ambulatory Visit: Payer: Self-pay

## 2018-08-21 ENCOUNTER — Ambulatory Visit (HOSPITAL_COMMUNITY): Payer: Medicare Other | Attending: Cardiology

## 2018-08-21 DIAGNOSIS — I77810 Thoracic aortic ectasia: Secondary | ICD-10-CM | POA: Diagnosis not present

## 2018-08-21 DIAGNOSIS — I5181 Takotsubo syndrome: Secondary | ICD-10-CM | POA: Diagnosis not present

## 2018-08-22 ENCOUNTER — Telehealth: Payer: Self-pay

## 2018-08-22 NOTE — Telephone Encounter (Signed)
Left message for patient to call back  

## 2018-08-22 NOTE — Telephone Encounter (Signed)
-----   Message from Imogene Burn, PA-C sent at 08/22/2018  2:32 PM EST ----- Echo shows normal heart function but heart has trouble relaxing.  Mild aortic stenosis left and right atrium are moderately dilated and PA pressures are increased to 53 mmHg higher than last echo.  A sending aorta similar to her last echo.  No changes.

## 2018-08-23 NOTE — Telephone Encounter (Signed)
Follow up ° °Pt returning call for nurse °

## 2018-08-23 NOTE — Telephone Encounter (Signed)
Left message for patient to call back  

## 2018-08-23 NOTE — Telephone Encounter (Signed)
Attempted to contact patient multiple times, but the phone keeps ringing busy.

## 2018-08-23 NOTE — Telephone Encounter (Signed)
Follow up  ° ° °Patient is returning your call. °

## 2018-08-24 NOTE — Telephone Encounter (Signed)
The patient has been notified of the result and verbalized understanding. Patient asking if she can proceed with her surgery. At [place] on 08/08/18, Susan Barrios, PA stated   "We will order 2D echo to reassess her aortic root but unless anything unusual comes back she can proceed with the necessary surgery without any further cardiac testing but accepting the increased risk.Reviewed with Dr. [name] who concurs."  Made patient aware that she should be able to proceed with her surgery. Will forward to Dr. Marlou Starks.

## 2018-08-24 NOTE — Telephone Encounter (Signed)
Patient returning your call.

## 2018-08-25 NOTE — Progress Notes (Addendum)
12-25-17 (Epic) EKG  02-14-18 ( Epic) Stress  08-21-18 (Epic) ECHO  08-22-18 (Epic) Cardiac Clearance from Ermalinda Barrios, [place]   08-28-18 BMP results routed to Dr. Marlou Starks for review. Pt has a history of mild aortic stenosis. Chart and clearance reviewed with Dr. Milton Ferguson, Anesthesiologist. Okay for patient to proceed with surgery.

## 2018-08-25 NOTE — Patient Instructions (Addendum)
[name]  08/25/2018   Your procedure is scheduled on: 09-04-18    Report to [facility] Main  Entrance    Report to Admitting at 11:00 AM    Call this number if you have problems the morning of surgery [phone]    Remember: NO SOLID FOOD AFTER MIDNIGHT THE NIGHT PRIOR TO SURGERY. NOTHING BY MOUTH EXCEPT CLEAR LIQUIDS UNTIL 3 HOURS PRIOR TO SCHEDULED SURGERY. PLEASE FINISH ENSURE DRINK PER SURGEON ORDER 3 HOURS PRIOR TO SCHEDULED SURGERY TIME WHICH NEEDS TO BE COMPLETED AT 10:00 AM.   CLEAR LIQUID DIET   Foods Allowed                                                                     Foods Excluded  Coffee and tea, regular and decaf                             liquids that you cannot  Plain Jell-O in any flavor                                             see through such as: Fruit ices (not with fruit pulp)                                     milk, soups, orange juice  Iced Popsicles                                    All solid food Carbonated beverages, regular and diet                                    Cranberry, grape and apple juices Sports drinks like Gatorade Lightly seasoned clear broth or consume(fat free) Sugar, honey syrup  Sample Menu Breakfast                                Lunch                                     Supper Cranberry juice                    Beef broth                            Chicken broth Jell-O                                     Grape juice  Apple juice Coffee or tea                        Jell-O                                      Popsicle                                                Coffee or tea                        Coffee or tea  _____________________________________________________________________    Take these medicines the morning of surgery with A SIP OF WATER: Amlodipine (Norvasc), Clonidine (Catapres) and Metoprolol. You Markovitz also use your Clonazepam and Omeprazole, as  needed. You Stelle also bring and use your eyedrops.                                You Oviatt not have any metal on your body including hair pins and              piercings  Do not wear jewelry, make-up, lotions, powders or perfumes, deodorant             Do not wear nail polish.  Do not shave  48 hours prior to surgery.                Do not bring valuables to the hospital. Napoleon.  Contacts, dentures or bridgework Garvey not be worn into surgery.  Leave suitcase in the car. After surgery it Angell be brought to your room.  r:  Special Instructions: Follow your prep, per your surgeon's instructions              Please read over the following fact sheets you were given: _____________________________________________________________________             [facility] - Preparing for Surgery Before surgery, you can play an important role.  Because skin is not sterile, your skin needs to be as free of germs as possible.  You can reduce the number of germs on your skin by washing with CHG (chlorahexidine gluconate) soap before surgery.  CHG is an antiseptic cleaner which kills germs and bonds with the skin to continue killing germs even after washing. Please DO NOT use if you have an allergy to CHG or antibacterial soaps.  If your skin becomes reddened/irritated stop using the CHG and inform your nurse when you arrive at Short Stay. Do not shave (including legs and underarms) for at least 48 hours prior to the first CHG shower.  You Diegel shave your face/neck. Please follow these instructions carefully:  1.  Shower with CHG Soap the night before surgery and the  morning of Surgery.  2.  If you choose to wash your hair, wash your hair first as usual with your  normal  shampoo.  3.  After you shampoo, rinse your hair and body thoroughly to remove the  shampoo.  4.  Use CHG as you would any other liquid soap.  You can apply chg  directly  to the skin and wash                       Gently with a scrungie or clean washcloth.  5.  Apply the CHG Soap to your body ONLY FROM THE NECK DOWN.   Do not use on face/ open                           Wound or open sores. Avoid contact with eyes, ears mouth and genitals (private parts).                       Wash face,  Genitals (private parts) with your normal soap.             6.  Wash thoroughly, paying special attention to the area where your surgery  will be performed.  7.  Thoroughly rinse your body with warm water from the neck down.  8.  DO NOT shower/wash with your normal soap after using and rinsing off  the CHG Soap.                9.  Pat yourself dry with a clean towel.            10.  Wear clean pajamas.            11.  Place clean sheets on your bed the night of your first shower and do not  sleep with pets. Day of Surgery : Do not apply any lotions/deodorants the morning of surgery.  Please wear clean clothes to the hospital/surgery center.  FAILURE TO FOLLOW THESE INSTRUCTIONS Crossen RESULT IN THE CANCELLATION OF YOUR SURGERY PATIENT SIGNATURE_________________________________  NURSE SIGNATURE__________________________________  ________________________________________________________________________  WHAT IS A BLOOD TRANSFUSION? Blood Transfusion Information  A transfusion is the replacement of blood or some of its parts. Blood is made up of multiple cells which provide different functions.  Red blood cells carry oxygen and are used for blood loss replacement.  White blood cells fight against infection.  Platelets control bleeding.  Plasma helps clot blood.  Other blood products are available for specialized needs, such as hemophilia or other clotting disorders. BEFORE THE TRANSFUSION  Who gives blood for transfusions?   Healthy volunteers who are fully evaluated to make sure their blood is safe. This is blood bank blood. Transfusion therapy is the safest  it has ever been in the practice of medicine. Before blood is taken from a donor, a complete history is taken to make sure that person has no history of diseases nor engages in risky social behavior (examples are intravenous drug use or sexual activity with multiple partners). The donor's travel history is screened to minimize risk of transmitting infections, such as malaria. The donated blood is tested for signs of infectious diseases, such as HIV and hepatitis. The blood is then tested to be sure it is compatible with you in order to minimize the chance of a transfusion reaction. If you or a relative donates blood, this is often done in anticipation of surgery and is not appropriate for emergency situations. It takes many days to process the donated blood. RISKS AND COMPLICATIONS Although transfusion therapy is very safe and saves many lives, the main dangers of transfusion include:   Getting an infectious disease.  Developing a transfusion reaction.  This is an allergic reaction to something in the blood you were given. Every precaution is taken to prevent this. The decision to have a blood transfusion has been considered carefully by your caregiver before blood is given. Blood is not given unless the benefits outweigh the risks. AFTER THE TRANSFUSION  Right after receiving a blood transfusion, you will usually feel much better and more energetic. This is especially true if your red blood cells have gotten low (anemic). The transfusion raises the level of the red blood cells which carry oxygen, and this usually causes an energy increase.  The nurse administering the transfusion will monitor you carefully for complications. HOME CARE INSTRUCTIONS  No special instructions are needed after a transfusion. You Doria find your energy is better. Speak with your caregiver about any limitations on activity for underlying diseases you Mcinnis have. SEEK MEDICAL CARE IF:   Your condition is not improving after  your transfusion.  You develop redness or irritation at the intravenous (IV) site. SEEK IMMEDIATE MEDICAL CARE IF:  Any of the following symptoms occur over the next 12 hours:  Shaking chills.  You have a temperature by mouth above 102 F (38.9 C), not controlled by medicine.  Chest, back, or muscle pain.  People around you feel you are not acting correctly or are confused.  Shortness of breath or difficulty breathing.  Dizziness and fainting.  You get a rash or develop hives.  You have a decrease in urine output.  Your urine turns a dark color or changes to pink, red, or brown. Any of the following symptoms occur over the next 10 days:  You have a temperature by mouth above 102 F (38.9 C), not controlled by medicine.  Shortness of breath.  Weakness after normal activity.  The white part of the eye turns yellow (jaundice).  You have a decrease in the amount of urine or are urinating less often.  Your urine turns a dark color or changes to pink, red, or brown. Document Released: 08/20/2000 Document Revised: 11/15/2011 Document Reviewed: 04/08/2008 [facility] Patient Information 2014 [place], [place].  _______________________________________________________________________

## 2018-08-28 ENCOUNTER — Encounter (HOSPITAL_COMMUNITY)
Admission: RE | Admit: 2018-08-28 | Discharge: 2018-08-28 | Disposition: A | Discharge status: Home / Self Care | Payer: Medicare Other | Source: Ambulatory Visit | Attending: General Surgery | Admitting: General Surgery

## 2018-08-28 ENCOUNTER — Other Ambulatory Visit: Payer: Self-pay

## 2018-08-28 ENCOUNTER — Encounter (HOSPITAL_COMMUNITY): Payer: Self-pay

## 2018-08-28 DIAGNOSIS — K5732 Diverticulitis of large intestine without perforation or abscess without bleeding: Secondary | ICD-10-CM | POA: Insufficient documentation

## 2018-08-28 DIAGNOSIS — Z01812 Encounter for preprocedural laboratory examination: Secondary | ICD-10-CM | POA: Insufficient documentation

## 2018-08-28 LAB — BASIC METABOLIC PANEL
Anion gap: 7 (ref 5–15)
BUN: 26 mg/dL — ABNORMAL HIGH (ref 8–23)
CO2: 29 mmol/L (ref 22–32)
Calcium: 9.1 mg/dL (ref 8.9–10.3)
Chloride: 104 mmol/L (ref 98–111)
Creatinine, Ser: 1.14 mg/dL — ABNORMAL HIGH (ref 0.44–1.00)
GFR calc Af Amer: 52 mL/min — ABNORMAL LOW (ref >60)
GFR calc non Af Amer: 45 mL/min — ABNORMAL LOW (ref >60)
Glucose, Bld: 86 mg/dL (ref 70–99)
Potassium: 3.5 mmol/L (ref 3.5–5.1)
SODIUM: 140 mmol/L (ref 135–145)

## 2018-08-28 LAB — CBC
HCT: 36.2 % (ref 36.0–46.0)
Hemoglobin: 11 g/dL — ABNORMAL LOW (ref 12.0–15.0)
MCH: 28.6 pg (ref 26.0–34.0)
MCHC: 30.4 g/dL (ref 30.0–36.0)
MCV: 94 fL (ref 80.0–100.0)
Platelets: 269 10*3/uL (ref 150–400)
RBC: 3.85 MIL/uL — ABNORMAL LOW (ref 3.87–5.11)
RDW: 14 % (ref 11.5–15.5)
WBC: 7.1 10*3/uL (ref 4.0–10.5)
nRBC: 0 % (ref 0.0–0.2)

## 2018-09-04 ENCOUNTER — Encounter (HOSPITAL_COMMUNITY)
Admission: RE | Disposition: A | Discharge status: Home Health | Payer: Self-pay | Source: Home / Self Care | Attending: General Surgery

## 2018-09-04 ENCOUNTER — Other Ambulatory Visit: Payer: Self-pay

## 2018-09-04 ENCOUNTER — Inpatient Hospital Stay (HOSPITAL_COMMUNITY): Payer: Medicare Other

## 2018-09-04 ENCOUNTER — Inpatient Hospital Stay (HOSPITAL_COMMUNITY): Payer: Medicare Other | Admitting: Certified Registered Nurse Anesthetist

## 2018-09-04 ENCOUNTER — Inpatient Hospital Stay (HOSPITAL_COMMUNITY)
Admission: RE | Admit: 2018-09-04 | Discharge: 2018-09-11 | DRG: 654 | Disposition: A | Discharge status: Home Health | Payer: Medicare Other | Attending: General Surgery | Admitting: General Surgery

## 2018-09-04 ENCOUNTER — Encounter (HOSPITAL_COMMUNITY): Payer: Self-pay | Admitting: Certified Registered Nurse Anesthetist

## 2018-09-04 DIAGNOSIS — Z8719 Personal history of other diseases of the digestive system: Secondary | ICD-10-CM | POA: Diagnosis not present

## 2018-09-04 DIAGNOSIS — Z800 Family history of malignant neoplasm of digestive organs: Secondary | ICD-10-CM | POA: Diagnosis not present

## 2018-09-04 DIAGNOSIS — I110 Hypertensive heart disease with heart failure: Secondary | ICD-10-CM | POA: Diagnosis present

## 2018-09-04 DIAGNOSIS — J449 Chronic obstructive pulmonary disease, unspecified: Secondary | ICD-10-CM | POA: Diagnosis present

## 2018-09-04 DIAGNOSIS — I4821 Permanent atrial fibrillation: Secondary | ICD-10-CM | POA: Diagnosis not present

## 2018-09-04 DIAGNOSIS — I2510 Atherosclerotic heart disease of native coronary artery without angina pectoris: Secondary | ICD-10-CM | POA: Diagnosis present

## 2018-09-04 DIAGNOSIS — I272 Pulmonary hypertension, unspecified: Secondary | ICD-10-CM | POA: Diagnosis present

## 2018-09-04 DIAGNOSIS — N321 Vesicointestinal fistula: Principal | ICD-10-CM | POA: Diagnosis present

## 2018-09-04 DIAGNOSIS — I350 Nonrheumatic aortic (valve) stenosis: Secondary | ICD-10-CM | POA: Diagnosis present

## 2018-09-04 DIAGNOSIS — Z853 Personal history of malignant neoplasm of breast: Secondary | ICD-10-CM | POA: Diagnosis not present

## 2018-09-04 DIAGNOSIS — Z8249 Family history of ischemic heart disease and other diseases of the circulatory system: Secondary | ICD-10-CM

## 2018-09-04 DIAGNOSIS — Z7901 Long term (current) use of anticoagulants: Secondary | ICD-10-CM | POA: Diagnosis not present

## 2018-09-04 DIAGNOSIS — K219 Gastro-esophageal reflux disease without esophagitis: Secondary | ICD-10-CM | POA: Diagnosis not present

## 2018-09-04 DIAGNOSIS — N3289 Other specified disorders of bladder: Secondary | ICD-10-CM | POA: Diagnosis not present

## 2018-09-04 DIAGNOSIS — I5032 Chronic diastolic (congestive) heart failure: Secondary | ICD-10-CM | POA: Diagnosis not present

## 2018-09-04 DIAGNOSIS — Z85038 Personal history of other malignant neoplasm of large intestine: Secondary | ICD-10-CM | POA: Diagnosis not present

## 2018-09-04 DIAGNOSIS — Z808 Family history of malignant neoplasm of other organs or systems: Secondary | ICD-10-CM | POA: Diagnosis not present

## 2018-09-04 DIAGNOSIS — I252 Old myocardial infarction: Secondary | ICD-10-CM

## 2018-09-04 DIAGNOSIS — K567 Ileus, unspecified: Secondary | ICD-10-CM | POA: Diagnosis not present

## 2018-09-04 DIAGNOSIS — R7989 Other specified abnormal findings of blood chemistry: Secondary | ICD-10-CM | POA: Diagnosis not present

## 2018-09-04 DIAGNOSIS — J90 Pleural effusion, not elsewhere classified: Secondary | ICD-10-CM | POA: Diagnosis not present

## 2018-09-04 DIAGNOSIS — I10 Essential (primary) hypertension: Secondary | ICD-10-CM | POA: Diagnosis not present

## 2018-09-04 DIAGNOSIS — Z8673 Personal history of transient ischemic attack (TIA), and cerebral infarction without residual deficits: Secondary | ICD-10-CM

## 2018-09-04 DIAGNOSIS — K5732 Diverticulitis of large intestine without perforation or abscess without bleeding: Secondary | ICD-10-CM | POA: Diagnosis not present

## 2018-09-04 DIAGNOSIS — J9811 Atelectasis: Secondary | ICD-10-CM | POA: Diagnosis not present

## 2018-09-04 DIAGNOSIS — R0902 Hypoxemia: Secondary | ICD-10-CM

## 2018-09-04 DIAGNOSIS — Z79899 Other long term (current) drug therapy: Secondary | ICD-10-CM

## 2018-09-04 DIAGNOSIS — I340 Nonrheumatic mitral (valve) insufficiency: Secondary | ICD-10-CM | POA: Diagnosis not present

## 2018-09-04 HISTORY — PX: LAPAROSCOPIC RIGHT COLECTOMY: SHX5925

## 2018-09-04 HISTORY — PX: CYSTOSCOPY WITH STENT PLACEMENT: SHX5790

## 2018-09-04 LAB — TYPE AND SCREEN
ABO/RH(D): O POS
ANTIBODY SCREEN: NEGATIVE

## 2018-09-04 SURGERY — COLECTOMY, RIGHT, LAPAROSCOPIC
Anesthesia: General

## 2018-09-04 MED ORDER — AMLODIPINE BESYLATE 5 MG PO TABS
5.0000 mg | ORAL_TABLET | Freq: Every day | ORAL | Status: DC
  Administered 2018-09-05 – 2018-09-11 (×7): 5 mg via ORAL
  Filled 2018-09-04 (×7): qty 1

## 2018-09-04 MED ORDER — ONDANSETRON HCL 4 MG/2ML IJ SOLN
INTRAMUSCULAR | Status: AC
  Filled 2018-09-04: qty 2

## 2018-09-04 MED ORDER — SODIUM CHLORIDE 0.9 % IV SOLN
2.0000 g | INTRAVENOUS | Status: AC
  Administered 2018-09-04: 2 g via INTRAVENOUS
  Filled 2018-09-04: qty 2

## 2018-09-04 MED ORDER — ROCURONIUM BROMIDE 10 MG/ML (PF) SYRINGE
PREFILLED_SYRINGE | INTRAVENOUS | Status: DC | PRN
  Administered 2018-09-04 (×2): 20 mg via INTRAVENOUS
  Administered 2018-09-04: 50 mg via INTRAVENOUS

## 2018-09-04 MED ORDER — LACTATED RINGERS IV SOLN
INTRAVENOUS | Status: DC
  Administered 2018-09-04 (×3): via INTRAVENOUS

## 2018-09-04 MED ORDER — PROPOFOL 10 MG/ML IV BOLUS
INTRAVENOUS | Status: AC
  Filled 2018-09-04: qty 20

## 2018-09-04 MED ORDER — METOPROLOL TARTRATE 5 MG/5ML IV SOLN
5.0000 mg | Freq: Four times a day (QID) | INTRAVENOUS | Status: DC
  Administered 2018-09-04 – 2018-09-07 (×7): 5 mg via INTRAVENOUS
  Filled 2018-09-04 (×8): qty 5

## 2018-09-04 MED ORDER — FENTANYL CITRATE (PF) 100 MCG/2ML IJ SOLN
INTRAMUSCULAR | Status: AC
  Filled 2018-09-04: qty 2

## 2018-09-04 MED ORDER — METHYLENE BLUE 0.5 % INJ SOLN
INTRAVENOUS | Status: DC | PRN
  Administered 2018-09-04: 2 mL

## 2018-09-04 MED ORDER — CHLORHEXIDINE GLUCONATE CLOTH 2 % EX PADS: 6.0000 | MEDICATED_PAD | Freq: Once | CUTANEOUS | Status: DC

## 2018-09-04 MED ORDER — HYDROMORPHONE HCL 1 MG/ML IJ SOLN
INTRAMUSCULAR | Status: AC
  Filled 2018-09-04: qty 1

## 2018-09-04 MED ORDER — ONDANSETRON HCL 4 MG/2ML IJ SOLN
4.0000 mg | Freq: Once | INTRAMUSCULAR | Status: AC | PRN
  Administered 2018-09-04: 4 mg via INTRAVENOUS

## 2018-09-04 MED ORDER — FENTANYL CITRATE (PF) 100 MCG/2ML IJ SOLN
25.0000 ug | INTRAMUSCULAR | Status: DC | PRN
  Administered 2018-09-04 (×3): 50 ug via INTRAVENOUS

## 2018-09-04 MED ORDER — SODIUM CHLORIDE 0.9 % IV SOLN
INTRAVENOUS | Status: DC
  Administered 2018-09-04 – 2018-09-08 (×5): via INTRAVENOUS

## 2018-09-04 MED ORDER — 0.9 % SODIUM CHLORIDE (POUR BTL) OPTIME
TOPICAL | Status: DC | PRN
  Administered 2018-09-04: 4000 mL

## 2018-09-04 MED ORDER — HEPARIN SODIUM (PORCINE) 5000 UNIT/ML IJ SOLN
5000.0000 [IU] | Freq: Three times a day (TID) | INTRAMUSCULAR | Status: DC
  Administered 2018-09-05 (×2): 5000 [IU] via SUBCUTANEOUS
  Filled 2018-09-04 (×2): qty 1

## 2018-09-04 MED ORDER — GABAPENTIN 300 MG PO CAPS
300.0000 mg | ORAL_CAPSULE | ORAL | Status: AC
  Administered 2018-09-04: 300 mg via ORAL
  Filled 2018-09-04: qty 1

## 2018-09-04 MED ORDER — HYDROMORPHONE HCL 1 MG/ML IJ SOLN
0.2500 mg | INTRAMUSCULAR | Status: DC | PRN
  Administered 2018-09-04 (×4): 0.25 mg via INTRAVENOUS

## 2018-09-04 MED ORDER — PROMETHAZINE HCL 25 MG/ML IJ SOLN
INTRAMUSCULAR | Status: AC
  Filled 2018-09-04: qty 1

## 2018-09-04 MED ORDER — PROMETHAZINE HCL 25 MG/ML IJ SOLN
6.2500 mg | INTRAMUSCULAR | Status: DC | PRN
  Administered 2018-09-04: 6.25 mg via INTRAVENOUS

## 2018-09-04 MED ORDER — PANTOPRAZOLE SODIUM 40 MG IV SOLR
40.0000 mg | Freq: Every day | INTRAVENOUS | Status: DC
  Administered 2018-09-04 – 2018-09-10 (×7): 40 mg via INTRAVENOUS
  Filled 2018-09-04 (×7): qty 40

## 2018-09-04 MED ORDER — PROPOFOL 10 MG/ML IV BOLUS
INTRAVENOUS | Status: DC | PRN
  Administered 2018-09-04: 80 mg via INTRAVENOUS

## 2018-09-04 MED ORDER — FENTANYL CITRATE (PF) 100 MCG/2ML IJ SOLN
INTRAMUSCULAR | Status: DC | PRN
  Administered 2018-09-04 (×8): 50 ug via INTRAVENOUS

## 2018-09-04 MED ORDER — ALVIMOPAN 12 MG PO CAPS
12.0000 mg | ORAL_CAPSULE | Freq: Two times a day (BID) | ORAL | Status: DC
  Administered 2018-09-05 – 2018-09-06 (×4): 12 mg via ORAL
  Filled 2018-09-04 (×4): qty 1

## 2018-09-04 MED ORDER — ONDANSETRON HCL 4 MG/2ML IJ SOLN
4.0000 mg | Freq: Four times a day (QID) | INTRAMUSCULAR | Status: DC | PRN
  Administered 2018-09-05 – 2018-09-09 (×3): 4 mg via INTRAVENOUS
  Filled 2018-09-04 (×3): qty 2

## 2018-09-04 MED ORDER — FENTANYL CITRATE (PF) 250 MCG/5ML IJ SOLN
INTRAMUSCULAR | Status: AC
  Filled 2018-09-04: qty 5

## 2018-09-04 MED ORDER — SODIUM CHLORIDE 0.9 % IR SOLN
Status: DC | PRN
  Administered 2018-09-04: 3000 mL
  Administered 2018-09-04: 1000 mL

## 2018-09-04 MED ORDER — DEXAMETHASONE SODIUM PHOSPHATE 10 MG/ML IJ SOLN
INTRAMUSCULAR | Status: DC | PRN
  Administered 2018-09-04: 5 mg via INTRAVENOUS

## 2018-09-04 MED ORDER — ALVIMOPAN 12 MG PO CAPS
12.0000 mg | ORAL_CAPSULE | ORAL | Status: AC
  Administered 2018-09-04: 12 mg via ORAL
  Filled 2018-09-04: qty 1

## 2018-09-04 MED ORDER — ACETAMINOPHEN 500 MG PO TABS
1000.0000 mg | ORAL_TABLET | ORAL | Status: AC
  Administered 2018-09-04: 1000 mg via ORAL
  Filled 2018-09-04: qty 2

## 2018-09-04 MED ORDER — LACTATED RINGERS IR SOLN
Status: DC | PRN
  Administered 2018-09-04: 1000 mL

## 2018-09-04 MED ORDER — ONDANSETRON 4 MG PO TBDP: 4.0000 mg | ORAL_TABLET | Freq: Four times a day (QID) | ORAL | Status: DC | PRN

## 2018-09-04 MED ORDER — BUPIVACAINE-EPINEPHRINE 0.25% -1:200000 IJ SOLN
INTRAMUSCULAR | Status: DC | PRN
  Administered 2018-09-04: 30 mL

## 2018-09-04 MED ORDER — EPHEDRINE SULFATE-NACL 50-0.9 MG/10ML-% IV SOSY
PREFILLED_SYRINGE | INTRAVENOUS | Status: DC | PRN
  Administered 2018-09-04: 5 mg via INTRAVENOUS
  Administered 2018-09-04: 10 mg via INTRAVENOUS

## 2018-09-04 MED ORDER — METHYLENE BLUE 0.5 % INJ SOLN
INTRAVENOUS | Status: AC
  Filled 2018-09-04: qty 10

## 2018-09-04 MED ORDER — SUGAMMADEX SODIUM 200 MG/2ML IV SOLN
INTRAVENOUS | Status: DC | PRN
  Administered 2018-09-04: 200 mg via INTRAVENOUS

## 2018-09-04 MED ORDER — MORPHINE SULFATE (PF) 4 MG/ML IV SOLN
1.0000 mg | INTRAVENOUS | Status: DC | PRN
  Administered 2018-09-04: 2 mg via INTRAVENOUS
  Administered 2018-09-04: 1 mg via INTRAVENOUS
  Administered 2018-09-05: 4 mg via INTRAVENOUS
  Administered 2018-09-05 (×2): 2 mg via INTRAVENOUS
  Administered 2018-09-05: 4 mg via INTRAVENOUS
  Administered 2018-09-05 – 2018-09-07 (×5): 2 mg via INTRAVENOUS
  Administered 2018-09-07: 4 mg via INTRAVENOUS
  Administered 2018-09-07: 2 mg via INTRAVENOUS
  Administered 2018-09-07: 4 mg via INTRAVENOUS
  Administered 2018-09-08: 2 mg via INTRAVENOUS
  Administered 2018-09-08: 4 mg via INTRAVENOUS
  Administered 2018-09-08 – 2018-09-09 (×4): 2 mg via INTRAVENOUS
  Filled 2018-09-04 (×22): qty 1

## 2018-09-04 MED ORDER — ONDANSETRON HCL 4 MG/2ML IJ SOLN
INTRAMUSCULAR | Status: DC | PRN
  Administered 2018-09-04: 4 mg via INTRAVENOUS

## 2018-09-04 MED ORDER — BUPIVACAINE-EPINEPHRINE (PF) 0.25% -1:200000 IJ SOLN
INTRAMUSCULAR | Status: AC
  Filled 2018-09-04: qty 30

## 2018-09-04 MED ORDER — LIDOCAINE 2% (20 MG/ML) 5 ML SYRINGE
INTRAMUSCULAR | Status: DC | PRN
  Administered 2018-09-04: 80 mg via INTRAVENOUS

## 2018-09-04 MED ORDER — HYDRALAZINE HCL 20 MG/ML IJ SOLN
INTRAMUSCULAR | Status: DC | PRN
  Administered 2018-09-04 (×2): 5 mg via INTRAVENOUS

## 2018-09-04 SURGICAL SUPPLY — 86 items
APPLIER CLIP 5 13 M/L LIGAMAX5 (MISCELLANEOUS)
APPLIER CLIP ROT 10 11.4 M/L (STAPLE)
APR CLP MED LRG 11.4X10 (STAPLE)
APR CLP MED LRG 5 ANG JAW (MISCELLANEOUS)
BAG URO CATCHER STRL LF (MISCELLANEOUS) ×3 IMPLANT
BLADE EXTENDED COATED 6.5IN (ELECTRODE) IMPLANT
BLADE HEX COATED 2.75 (ELECTRODE) ×3 IMPLANT
BLADE SURG SZ10 CARB STEEL (BLADE) ×1 IMPLANT
CATH INTERMIT  6FR 70CM (CATHETERS) ×3 IMPLANT
CELLS DAT CNTRL 66122 CELL SVR (MISCELLANEOUS) IMPLANT
CLIP APPLIE 5 13 M/L LIGAMAX5 (MISCELLANEOUS) IMPLANT
CLIP APPLIE ROT 10 11.4 M/L (STAPLE) IMPLANT
CLOTH BEACON ORANGE TIMEOUT ST (SAFETY) ×3 IMPLANT
CONNECTOR 5 IN 1 STRAIGHT STRL (MISCELLANEOUS) IMPLANT
COVER MAYO STAND STRL (DRAPES) ×3 IMPLANT
COVER SURGICAL LIGHT HANDLE (MISCELLANEOUS) ×3 IMPLANT
COVER WAND RF STERILE (DRAPES) ×1 IMPLANT
DEVICE TROCAR PUNCTURE CLOSURE (ENDOMECHANICALS) IMPLANT
DRAPE LAPAROSCOPIC ABDOMINAL (DRAPES) ×3 IMPLANT
DRAPE SHEET LG 3/4 BI-LAMINATE (DRAPES) ×1 IMPLANT
DRAPE WARM FLUID 44X44 (DRAPE) ×3 IMPLANT
DRSG OPSITE POSTOP 4X8 (GAUZE/BANDAGES/DRESSINGS) ×1 IMPLANT
ELECT PENCIL ROCKER SW 15FT (MISCELLANEOUS) ×3 IMPLANT
ELECT REM PT RETURN 15FT ADLT (MISCELLANEOUS) ×3 IMPLANT
ENSEAL DEVICE STD TIP 35CM (ENDOMECHANICALS) IMPLANT
GAUZE SPONGE 4X4 12PLY STRL (GAUZE/BANDAGES/DRESSINGS) ×3 IMPLANT
GLOVE BIO SURGEON STRL SZ7 (GLOVE) ×4 IMPLANT
GLOVE BIO SURGEON STRL SZ7.5 (GLOVE) ×6 IMPLANT
GLOVE BIOGEL M STRL SZ7.5 (GLOVE) ×4 IMPLANT
GLOVE BIOGEL PI IND STRL 7.0 (GLOVE) ×2 IMPLANT
GLOVE BIOGEL PI INDICATOR 7.0 (GLOVE) ×4
GOWN STRL REUS W/TWL LRG LVL3 (GOWN DISPOSABLE) ×8 IMPLANT
GOWN STRL REUS W/TWL XL LVL3 (GOWN DISPOSABLE) ×18 IMPLANT
GUIDEWIRE STR DUAL SENSOR (WIRE) ×3 IMPLANT
KIT SIGMOIDOSCOPE (SET/KITS/TRAYS/PACK) ×1 IMPLANT
LEGGING LITHOTOMY PAIR STRL (DRAPES) ×1 IMPLANT
MANIFOLD NEPTUNE II (INSTRUMENTS) ×3 IMPLANT
PACK COLON (CUSTOM PROCEDURE TRAY) ×1 IMPLANT
PACK CYSTO (CUSTOM PROCEDURE TRAY) ×3 IMPLANT
PAD POSITIONING PINK XL (MISCELLANEOUS) ×1 IMPLANT
PORT LAP GEL ALEXIS MED 5-9CM (MISCELLANEOUS) ×1 IMPLANT
PROTECTOR NERVE ULNAR (MISCELLANEOUS) ×2 IMPLANT
RETRACTOR WND ALEXIS 18 MED (MISCELLANEOUS) IMPLANT
RTRCTR WOUND ALEXIS 18CM MED (MISCELLANEOUS)
SCISSORS LAP 5X35 DISP (ENDOMECHANICALS) ×1 IMPLANT
SET IRRIG TUBING LAPAROSCOPIC (IRRIGATION / IRRIGATOR) ×1 IMPLANT
SET IRRIG Y TYPE TUR BLADDER L (SET/KITS/TRAYS/PACK) ×1 IMPLANT
SHEARS HARMONIC ACE PLUS 36CM (ENDOMECHANICALS) ×3 IMPLANT
SLEEVE XCEL OPT CAN 5 100 (ENDOMECHANICALS) ×1 IMPLANT
SOLUTION ANTI FOG 6CC (MISCELLANEOUS) ×3 IMPLANT
SPONGE LAP 18X18 X RAY DECT (DISPOSABLE) ×1 IMPLANT
STAPLER CIRC CVD 29MM 37CM (STAPLE) ×1 IMPLANT
STAPLER CUT CVD 40MM GREEN (STAPLE) ×1 IMPLANT
STAPLER VISISTAT 35W (STAPLE) ×3 IMPLANT
STENT URET 6FRX26 CONTOUR (STENTS) ×1 IMPLANT
STRIP CLOSURE SKIN 1/2X4 (GAUZE/BANDAGES/DRESSINGS) IMPLANT
SUT PDS AB 1 CT1 27 (SUTURE) IMPLANT
SUT PDS AB 1 CTX 36 (SUTURE) IMPLANT
SUT PDS AB 1 TP1 96 (SUTURE) ×2 IMPLANT
SUT PDS AB 4-0 SH 27 (SUTURE) IMPLANT
SUT PROLENE 2 0 BLUE (SUTURE) ×1 IMPLANT
SUT PROLENE 2 0 KS (SUTURE) IMPLANT
SUT SILK 2 0 (SUTURE) ×3
SUT SILK 2 0 SH CR/8 (SUTURE) ×4 IMPLANT
SUT SILK 2-0 18XBRD TIE 12 (SUTURE) ×2 IMPLANT
SUT SILK 3 0 (SUTURE) ×3
SUT SILK 3 0 SH CR/8 (SUTURE) ×3 IMPLANT
SUT SILK 3-0 18XBRD TIE 12 (SUTURE) ×2 IMPLANT
SUT VIC AB 2-0 CT1 27 (SUTURE)
SUT VIC AB 2-0 CT1 27XBRD (SUTURE) IMPLANT
SUT VIC AB 2-0 SH 18 (SUTURE) ×1 IMPLANT
SUT VIC AB 3-0 PS2 18 (SUTURE)
SUT VIC AB 3-0 PS2 18XBRD (SUTURE) IMPLANT
SUT VIC AB 4-0 SH 18 (SUTURE) ×1 IMPLANT
SUT VICRYL 0 UR6 27IN ABS (SUTURE) IMPLANT
SYR 30ML LL (SYRINGE) ×3 IMPLANT
SYR BULB IRRIGATION 50ML (SYRINGE) ×3 IMPLANT
TAPE CLOTH 4X10 WHT NS (GAUZE/BANDAGES/DRESSINGS) ×1 IMPLANT
TRAY FOLEY MTR SLVR 14FR STAT (SET/KITS/TRAYS/PACK) ×1 IMPLANT
TROCAR BLADELESS OPT 5 100 (ENDOMECHANICALS) ×1 IMPLANT
TROCAR XCEL NON-BLD 11X100MML (ENDOMECHANICALS) ×1 IMPLANT
TROCAR XCEL UNIV SLVE 11M 100M (ENDOMECHANICALS) IMPLANT
TUBING CONNECTING 10 (TUBING) ×5 IMPLANT
TUBING INSUF HEATED (TUBING) ×3 IMPLANT
YANKAUER SUCT BULB TIP 10FT TU (MISCELLANEOUS) ×3 IMPLANT
YANKAUER SUCT BULB TIP NO VENT (SUCTIONS) ×3 IMPLANT

## 2018-09-04 NOTE — H&P (Signed)
[name]  Location: [facility] Patient #: [id number] DOB: [birth year] Married / Language: English / Race: White Female   History of Present Illness The patient is a 83 year old female who presents for a follow-up for Abdominal pain. The patient is an 83 year old white female who we have been following for some time for sigmoid colon diverticulitis. Her last episode required a percutaneous drain of an abscess. She recovered well from this and given her other comorbid conditions we elected to continue to observe her since she was asymptomatic. 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 She was started on antibiotics for enterococci. She does have a history of stroke and is on Eliquis.   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 Breast Mass; Local Excision  Left. Cataract Surgery  Bilateral. Colon Polyp Removal - Colonoscopy  Colon Polyp Removal - Open  Colon Removal - Complete  Gallbladder Surgery - Open  Sentinel Lymph Node Biopsy   Diagnostic Studies History  Colonoscopy  1-5 years ago Mammogram  1-3 years ago Pap Smear  1-5 years ago  Allergies  Isosorbide Dinitrate  Itching, Rash. cloNIDine HCl ER  derivatives Tape 1"X5yd *MEDICAL DEVICES*  Iodinated Contrast Media  Statins Depletion *DIETARY PRODUCTS/DIETARY MANAGEMENT PRODUCTS*  CeleBREX *ANALGESICS - ANTI-INFLAMMATORY*   Medication History  Tylenol (325MG  Tablet, Oral) Discontinued. AmLODIPine Besylate (10MG  Tablet, Oral) Active. CloNIDine HCl (0.1MG  Tablet, Oral) Active. Metoprolol Succinate ER (50MG  Tablet ER 24HR, Oral) Active. Spironolactone (25MG  Tablet, Oral) Active. Omeprazole (20MG  Capsule DR, Oral)  Active. Eliquis (5MG  Tablet, Oral) Active. ZyrTEC (10MG  Tablet, Oral) Discontinued. clonazePAM (0.5MG  Tablet, Oral) Active. Losartan Potassium (100MG  Tablet, Oral) Active. Mirtazapine (15MG  Tablet, Oral) Active. Mucinex (Oral) Specific strength unknown - Active. Torsemide (20MG  Tablet, Oral) Active. Nitroglycerin (0.4MG Dorita Fray Solution, Translingual) Discontinued. Medications Reconciled  Social History  No drug use  Tobacco use  Never smoker.  Family History  Colon Cancer  Sister. Heart Disease  Brother, Mother.  Pregnancy / Birth History  Age of menopause  62-60 Gravida  1 Maternal age  76-20 Para  1  Other Problems  Breast Cancer  Gastroesophageal Reflux Disease  Hemorrhoids  High blood pressure  Melanoma     Review of Systems  General Not Present- Appetite Loss, Chills, Fatigue, Fever, Night Sweats, Weight Gain and Weight Loss. Skin Not Present- Change in Wart/Mole, Dryness, Hives, Jaundice, New Lesions, Non-Healing Wounds, Rash and Ulcer. HEENT Present- Ringing in the Ears and Wears glasses/contact lenses. Not Present- Earache, Hearing Loss, Hoarseness, Nose Bleed, Oral Ulcers, Seasonal Allergies, Sinus Pain, Sore Throat, Visual Disturbances and Yellow Eyes. Breast Present- Skin Changes. Not Present- Breast Mass, Breast Pain and Nipple Discharge. Gastrointestinal Present- Hemorrhoids and Indigestion. Not Present- Abdominal Pain, Bloating, Bloody Stool, Change in Bowel Habits, Chronic diarrhea, Constipation, Difficulty Swallowing, Excessive gas, Gets full quickly at meals, Nausea, Rectal Pain and Vomiting. Musculoskeletal Present- Back Pain. Not Present- Joint Pain, Joint Stiffness, Muscle Pain, Muscle Weakness and Swelling of Extremities. Neurological Not Present- Decreased Memory, Fainting, Headaches, Numbness, Seizures, Tingling, Tremor, Trouble walking and Weakness. Hematology Present- Easy Bruising. Not Present- Excessive bleeding, Gland  problems, HIV and Persistent Infections.  Vitals  Weight: 193.38 lb Height: 66in Body Surface Area: 1.97 m Body Mass Index: 31.21 kg/m  Pulse:  61 (Regular)  BP: 144/70 (Sitting, Left Arm, Standard)       Physical Exam General Mental Status-Alert. General Appearance-Consistent with stated age. Hydration-Well hydrated. Voice-Normal.  Head and Neck Head-normocephalic, atraumatic with no lesions or palpable masses. Trachea-midline. Thyroid Gland Characteristics - normal size and consistency.  Eye Eyeball - Bilateral-Extraocular movements intact. Sclera/Conjunctiva - Bilateral-No scleral icterus.  Chest and Lung Exam Chest and lung exam reveals -quiet, even and easy respiratory effort with no use of accessory muscles and on auscultation, normal breath sounds, no adventitious sounds and normal vocal resonance. Inspection Chest Wall - Normal. Back - normal.  Cardiovascular Cardiovascular examination reveals -normal heart sounds, regular rate and rhythm with no murmurs and normal pedal pulses bilaterally.  Abdomen Note: The abdomen is soft. There is mild tenderness in the suprapubic region   Neurologic Neurologic evaluation reveals -alert and oriented x 3 with no impairment of recent or remote memory. Mental Status-Normal.  Musculoskeletal Normal Exam - Left-Upper Extremity Strength Normal and Lower Extremity Strength Normal. Normal Exam - Right-Upper Extremity Strength Normal and Lower Extremity Strength Normal.  Lymphatic Head & Neck  General Head & Neck Lymphatics: Bilateral - Description - Normal. Axillary  General Axillary Region: Bilateral - Description - Normal. Tenderness - Non Tender. Femoral & Inguinal  Generalized Femoral & Inguinal Lymphatics: Bilateral - Description - Normal. Tenderness - Non Tender.    Assessment & Plan  DIVERTICULITIS, COLON (K57.32) Impression: The patient has a history of diverticulitis  of the sigmoid colon that previously required a percutaneous drain. She recovered well and was relatively asymptomatic. Given her other medical conditions since she was doing so well we decided to continue to observe her. 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 Unfortunately this will likely not resolve without surgery. I have discussed with her in detail the risks and benefits of the operation to remove this segment of colon and repair the bladder as well as some of the technical aspects including the possibility of needing a colostomy and she understands and wishes to proceed. I will contact Dr. [name] in cardiology for clearance since she is on a blood thinner. I will contact one of our urologists to see will need to see her prior to surgery. We will then begin planning for a surgical date. Given the presence of the fistula and the bacteria in her urine she should probably stay on an antibiotic until the time of surgery.

## 2018-09-04 NOTE — Anesthesia Postprocedure Evaluation (Signed)
Anesthesia Post Note  Patient: [name]  Procedure(s) Performed: LAPAROSCOPIC ASSISTED SIGMOID COLECTOMY WITH REPAIR OF FISTULA TO BLADDER (N/A ) CYSTOSCOPY WITH LEFT STENT PLACEMENT, BLADDER REPAIR, CYSTOSCOPY WITH LEFT STENT REMOVAL (Left )     Patient location during evaluation: PACU Anesthesia Type: General Level of consciousness: awake and alert Pain management: pain level controlled Vital Signs Assessment: post-procedure vital signs reviewed and stable Respiratory status: spontaneous breathing, nonlabored ventilation, respiratory function stable and patient connected to nasal cannula oxygen Cardiovascular status: blood pressure returned to baseline and stable Postop Assessment: no apparent nausea or vomiting Anesthetic complications: no    Last Vitals:  Vitals:   09/04/18 2003 09/04/18 2100  BP: (!) 138/54 (!) 151/60  Pulse: 65 62  Resp: 16 16  Temp: (!) 36.3 C 36.7 C  SpO2: 92% 98%    Last Pain:  Vitals:   09/04/18 2100  TempSrc: Oral  PainSc:                  Amauri Medellin L Martrell Eguia

## 2018-09-04 NOTE — Anesthesia Preprocedure Evaluation (Addendum)
Anesthesia Evaluation  Patient identified by MRN, date of birth, ID band Patient awake    Reviewed: Allergy & Precautions, NPO status , Patient's Chart, lab work & pertinent test results  Airway Mallampati: II  TM Distance: >3 FB Neck ROM: Full    Dental no notable dental hx. (+) Edentulous Upper, Edentulous Lower, Dental Advisory Given   Pulmonary COPD,    Pulmonary exam normal breath sounds clear to auscultation       Cardiovascular hypertension, + CAD, + Past MI and +CHF  Normal cardiovascular exam+ dysrhythmias Atrial Fibrillation  Rhythm:Irregular Rate:Normal  H/o takotsubo cardiomyopathy, dilated aortic root  Stress Test 02/2018 Nuclear stress EF: 63%. The left ventricular ejection fractin is normal (55-65%). This is a low risk study. The study is normal. There is no evidence of infarction or ischemia.  TTE 08/2018 - Left ventricle: There was moderate concentric hypertrophy. Systolic function was normal. The estimated ejection fraction was in the range of 60% to 65%. Wall motion was normal; there were no regional wall motion abnormalities. Grade 2 diastolic dysfunction. - Aortic valve: There was mild stenosis. There was no   regurgitation. - Ascending aorta: The ascending aorta was normal - Tricuspid valve: There was moderate regurgitation. - Pulmonary arteries: Systolic pressure was moderately increased. - Inferior vena cava: elevated central venous pressure.    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, last dose Saturday morning H/o left breast cancer  Reproductive/Obstetrics                            Anesthesia Physical Anesthesia Plan  ASA: III  Anesthesia Plan: General   Post-op Pain Management:    Induction: Intravenous  PONV Risk Score and Plan: 3 and Treatment Rosch vary due to age or medical condition, Dexamethasone and Ondansetron  Airway Management Planned: Oral ETT  Additional Equipment: Arterial line  Intra-op Plan:   Post-operative Plan: Extubation in OR  Informed Consent: I have reviewed the patients History and Physical, chart, labs and discussed the procedure including the risks, benefits and alternatives for the proposed anesthesia with the patient or authorized representative who has indicated his/her understanding and acceptance.   Dental advisory given  Plan Discussed with: CRNA  Anesthesia Plan Comments:         Anesthesia Quick Evaluation

## 2018-09-04 NOTE — Op Note (Signed)
09/04/2018  4:19 PM  PATIENT:  [name]  83 y.o. female  PRE-OPERATIVE DIAGNOSIS:  colovesicle fistula  POST-OPERATIVE DIAGNOSIS:  colovesicle fistula  PROCEDURE:  Procedure(s): LAPAROSCOPIC ASSISTED SIGMOID COLECTOMY  RIGID SIGMOIDOSCOPY REPAIR OF BLADDER (N/A) CYSTOSCOPY WITH LEFT STENT PLACEMENT, BLADDER REPAIR, CYSTOSCOPY WITH LEFT STENT REMOVAL (Left)  SURGEON:  Surgeon(s) and Role: Panel 1:    * Jovita Kussmaul, MD - Primary    * Johnathan Hausen, MD - Assisting Panel 2:    * Raynelle Bring, MD - Primary  PHYSICIAN ASSISTANT:   ASSISTANTS: Dr. Hassell Done   ANESTHESIA:   general  EBL:  75 mL   BLOOD ADMINISTERED:none  DRAINS: none   LOCAL MEDICATIONS USED:  MARCAINE     SPECIMEN:  Source of Specimen:  sigmoid colon  DISPOSITION OF SPECIMEN:  PATHOLOGY  COUNTS:  YES  TOURNIQUET:  * No tourniquets in log *  DICTATION: .Dragon Dictation   After informed consent was obtained the patient was brought to the operating room and placed in the supine position on the operating table.  After adequate induction of general anesthesia and after a cystoscopy was performed with stent placement by [name] then the patient was placed in lithotomy position and all pressure points were padded.  The abdomen was prepped with ChloraPrep and the perineum was prepped with Betadine, allowed to dry, and draped in usual sterile manner.  An appropriate timeout was performed.  The Foley was then placed so that the Foley could be accessed on the field.  Next a site was chosen in the left upper quadrant to access the abdominal cavity.  This area was infiltrated with quarter percent Marcaine.  A small stab incision was made with a 15 blade knife.  A 5 mm Optiview port and camera were used to bluntly dissected the layers of the abdominal wall under direct vision until access was gained to the abdominal cavity.  Another 5 mm port was placed under direct vision in the left lower quadrant.  There were  some adhesions to the anterior abdominal wall that were taken down sharply with the laparoscopic scissors and harmonic scalpel.  Care was taken to avoid any injury to the bowel during this dissection.  Another 5 mm port was placed under direct vision just to the right of midline in the mid abdomen.  I was unable to mobilize the left colon by incising the retroperitoneal attachment along the white line of Toldt.  The sigmoid colon was very densely adherent to the pelvis and because of this at this point we decided to open through a lower midline incision.  The incision was carried through the skin and subcutaneous tissue sharply with electrocautery until the linea alba was identified.  The linea alba was incised with the electrocautery.  The preperitoneal space was then opened sharply with electrocautery until the abdomen was accessed and the rest of the incision was opened under direct vision.  A wound protector was deployed.  Next I was able to dissected the sigmoid colon away from the pelvic sidewall by a combination of sharp dissection with the electrocautery and blunt finger dissection.  We were able to finger fracture through a lot of the previously inflamed tissue.  The sigmoid colon was then finger fractured away from the uterus and the bladder.  A site was chosen above and below the area of disease where the colon appeared soft and healthy and normal.  The mesentery at each of these points was opened sharply  with the electrocautery.  The rectum was then divided with a single firing of the green load contour stapler.  The proximal colon was divided between an Allen clamps.  The mesentery to the sigmoid colon was then taken down sharply with the harmonic scalpel.  The main vascular branches were then controlled with 2-0 silk figure-of-eight stitches.  Once this was accomplished the diseased portion of sigmoid colon was removed from the patient.  It was marked with a stitch on the distal staple line.  This was  sent to pathology for further evaluation.  There appeared to be good length on the proximal segment and it easily reached to the rectal stump.  We then open the proximal segment and it sized to a 29 mm EEA stapler.  This was chosen.  A 2-0 Prolene pursestring stitch was placed around the edge of the proximal colon.  The anvil was placed in the colon and the pursestring was cinched down and tied.  Next a stapling device was brought through the rectum and up to the area anterior to the staple line.  The spike was deployed and the anvil was connected to the spike.  The device was closed into the green zone.  A minute was allowed to pass and then the device was fired thereby creating a nice widely patent enteroenterostomy.  The staple line was then reinforced with multiple interrupted 2-0 silk Lembert stitches.  Next a soft bowel clamp was placed above the anastomosis.  The pelvis was filled with saline and a rigid sigmoidoscopy was performed insufflating the distal segment.  After several minutes of observation we did not see any significant air bubbles.  The abdomen was then irrigated with copious amounts of saline.  At this point [name] came in to repair the bladder.  His portion will be dictated separately.  We did not see any leak from a distended bladder.  Next the fascia of the anterior bowel wall was closed with 2 running #1 double-stranded looped PDS sutures.  The subcutaneous tissue was irrigated with saline and then closed with a running 2-0 Vicryl stitch.  The skin incisions were closed with staples.  Sterile dressings were applied.  The patient tolerated the procedure well.  At the end of the case all needle sponge and instrument counts were correct.  The patient was then awakened and taken to recovery in stable condition.  PLAN OF CARE: Admit to inpatient   PATIENT DISPOSITION:  PACU - hemodynamically stable.   Delay start of Pharmacological VTE agent (>24hrs) due to surgical blood loss or risk  of bleeding: no

## 2018-09-04 NOTE — Interval H&P Note (Signed)
History and Physical Interval Note:  09/04/2018 12:47 PM  [name]  has presented today for surgery, with the diagnosis of colovesicle fistula  The various methods of treatment have been discussed with the patient and family. After consideration of risks, benefits and other options for treatment, the patient has consented to  Procedure(s): LAPAROSCOPIC ASSISTED SIGMOID COLECTOMY (N/A) POSSIBLE COLOSTOMY (N/A) REPAIR OF BLADDER (N/A) CYSTOSCOPY WITH STENT PLACEMENT (N/A) as a surgical intervention .  The patient's history has been reviewed, patient examined, no change in status, stable for surgery.  I have reviewed the patient's chart and labs.  Questions were answered to the patient's satisfaction.     Autumn Messing III

## 2018-09-04 NOTE — Anesthesia Procedure Notes (Signed)
Arterial Line Insertion Start/End12/30/2019 1:10 PM, 09/04/2018 1:20 PM Performed by: Freddrick March, MD, anesthesiologist  Patient location: OR. Preanesthetic checklist: patient identified, IV checked, risks and benefits discussed, surgical consent, monitors and equipment checked, pre-op evaluation and timeout performed Right, radial was placed Catheter size: 20 G Hand hygiene performed  Allen's test indicative of satisfactory collateral circulation Attempts: 1 Procedure performed without using ultrasound guided technique. Following insertion, dressing applied and Biopatch. Post procedure assessment: normal and unchanged  Patient tolerated the procedure well with no immediate complications.

## 2018-09-04 NOTE — Anesthesia Procedure Notes (Signed)
Procedure Name: Intubation Performed by: Oran Dillenburg J, CRNA Pre-anesthesia Checklist: Patient identified, Emergency Drugs available, Suction available, Patient being monitored and Timeout performed Patient Re-evaluated:Patient Re-evaluated prior to induction Oxygen Delivery Method: Circle system utilized Preoxygenation: Pre-oxygenation with 100% oxygen Induction Type: IV induction Ventilation: Mask ventilation without difficulty Laryngoscope Size: Mac and 3 Grade View: Grade I Tube type: Oral Tube size: 7.0 mm Number of attempts: 1 Airway Equipment and Method: Stylet Placement Confirmation: ETT inserted through vocal cords under direct vision,  positive ETCO2 and breath sounds checked- equal and bilateral Secured at: 21 cm Tube secured with: Tape Dental Injury: Teeth and Oropharynx as per pre-operative assessment        

## 2018-09-04 NOTE — Op Note (Signed)
Preoperative diagnosis: Colovesical fistula  Postoperative diagnosis: Colovesical fistula  Procedures: 1.  Cystoscopy and left ureteral stent placement 2.  Repair of bladder 3.  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  Surgical repair was not initially warranted based on her overall medical condition.  Her medical conditions subsequently stabilized and she was followed by [name] and was now felt to be an acceptable candidate for surgical repair.  She was therefore scheduled to undergo a combined procedure with cystoscopy and left ureteral stent placement prior to her laparoscopy/laparotomy.  We reviewed the potential risks, complications, and the expected recovery process associated with the urologic aspects of her procedure.  She gave informed consent.  Description of procedure: The patient was taken to the operating room and a general anesthetic was administered.  She was given preoperative antibiotics, placed in the dorsolithotomy position, and prepped and draped in usual sterile fashion.  Next a preoperative timeout was performed.  Cystourethroscopy was then performed which revealed similar findings to her office cystoscopy.  Specifically, she was noted to have an edematous area located toward the left aspect of the dome of the bladder consistent with her probable fistula.  Due to the potential need to identify the left ureter during her colon dissection, it was decided to proceed with preoperative ureteral stent placement.  A 0.38 sensor guidewire was inserted into the left ureter and up into the left renal pelvis under fluoroscopic guidance.  A 6 x 26 double-J ureteral stent was then advanced over the wire using Seldinger technique and positioned appropriately under  fluoroscopic and cystoscopic guidance.  The wire was removed with a good curl noted in the renal pelvis as well as in the bladder.  The patient was then repositioned in the low lithotomy position per [name].  A Foley catheter was placed sterilely on the field.  [name] then repeated with his portion of the procedure with colon resection and reanastomosis.  I then scrubbed in and we filled the bladder with over 300 cc of sterile saline with methylene blue.  No obvious extravasation was noted.  Based on her prior clinical diagnosis of colovesical fistula based on imaging, I did oversew the peritoneal surface of the bladder with 2-0 Vicryl sutures imbricating the inflamed peritoneal surface of the posterior bladder.  I then performed flexible cystoscopy and removed her left ureteral stent with flexible graspers after confirming that the left ureter was not injured during the procedure.  Her 67 French Foley catheter was then reinserted.   This will be left indwelling with plans for a follow-up cystogram prior to removal in the future.

## 2018-09-04 NOTE — Transfer of Care (Signed)
Immediate Anesthesia Transfer of Care Note  Patient: [name]  Procedure(s) Performed: LAPAROSCOPIC ASSISTED SIGMOID COLECTOMY WITH REPAIR OF FISTULA TO BLADDER (N/A ) CYSTOSCOPY WITH LEFT STENT PLACEMENT, BLADDER REPAIR, CYSTOSCOPY WITH LEFT STENT REMOVAL (Left )  Patient Location: PACU  Anesthesia Type:General  Level of Consciousness: unresponsive, patient cooperative and responds to stimulation  Airway & Oxygen Therapy: Patient Spontanous Breathing and Patient connected to face mask oxygen  Post-op Assessment: Report given to RN and Post -op Vital signs reviewed and stable  Post vital signs: Reviewed and stable  Last Vitals:  Vitals Value Taken Time  BP    Temp    Pulse    Resp    SpO2      Last Pain:  Vitals:   09/04/18 1147  TempSrc:   PainSc: 0-No pain      Patients Stated Pain Goal: 4 (24/49/75 3005)  Complications: No apparent anesthesia complications

## 2018-09-04 NOTE — Progress Notes (Signed)
Dr. Lanetta Inch notified that pt had chest pain in center of chest two weeks ago and took one nitro with some relief.  Pt did not go to see anyone regarding this episode of chest pain. Pt denies any cp or sob since two weeks ago.  Per Dr. Lanetta Inch, no new imaging or EKG's needed prior to procedure today.

## 2018-09-05 ENCOUNTER — Inpatient Hospital Stay (HOSPITAL_COMMUNITY): Payer: Medicare Other

## 2018-09-05 ENCOUNTER — Encounter (HOSPITAL_COMMUNITY): Payer: Self-pay | Admitting: General Surgery

## 2018-09-05 LAB — CBC
HCT: 32.8 % — ABNORMAL LOW (ref 36.0–46.0)
HEMOGLOBIN: 10.2 g/dL — AB (ref 12.0–15.0)
MCH: 28.5 pg (ref 26.0–34.0)
MCHC: 31.1 g/dL (ref 30.0–36.0)
MCV: 91.6 fL (ref 80.0–100.0)
Platelets: 265 10*3/uL (ref 150–400)
RBC: 3.58 MIL/uL — ABNORMAL LOW (ref 3.87–5.11)
RDW: 14.6 % (ref 11.5–15.5)
WBC: 12 10*3/uL — ABNORMAL HIGH (ref 4.0–10.5)
nRBC: 0 % (ref 0.0–0.2)

## 2018-09-05 LAB — BASIC METABOLIC PANEL
Anion gap: 10 (ref 5–15)
BUN: 18 mg/dL (ref 8–23)
CO2: 25 mmol/L (ref 22–32)
Calcium: 8.7 mg/dL — ABNORMAL LOW (ref 8.9–10.3)
Chloride: 102 mmol/L (ref 98–111)
Creatinine, Ser: 0.9 mg/dL (ref 0.44–1.00)
GFR calc Af Amer: >60 mL/min (ref >60)
GFR calc non Af Amer: 60 mL/min — ABNORMAL LOW (ref >60)
Glucose, Bld: 152 mg/dL — ABNORMAL HIGH (ref 70–99)
Potassium: 4.4 mmol/L (ref 3.5–5.1)
SODIUM: 137 mmol/L (ref 135–145)

## 2018-09-05 LAB — D-DIMER, QUANTITATIVE: D-Dimer, Quant: 3.92 ug/mL-FEU — ABNORMAL HIGH (ref 0.00–0.50)

## 2018-09-05 MED ORDER — DIPHENHYDRAMINE HCL 50 MG/ML IJ SOLN
50.0000 mg | Freq: Once | INTRAMUSCULAR | Status: AC
  Administered 2018-09-05: 50 mg via INTRAVENOUS
  Filled 2018-09-05: qty 1

## 2018-09-05 MED ORDER — HYDROCORTISONE NA SUCCINATE PF 250 MG IJ SOLR
200.0000 mg | Freq: Once | INTRAMUSCULAR | Status: AC
  Administered 2018-09-05: 200 mg via INTRAVENOUS
  Filled 2018-09-05: qty 200

## 2018-09-05 MED ORDER — NITROGLYCERIN 0.4 MG SL SUBL: 0.4000 mg | SUBLINGUAL_TABLET | SUBLINGUAL | Status: DC | PRN

## 2018-09-05 MED ORDER — IOPAMIDOL (ISOVUE-370) INJECTION 76%
INTRAVENOUS | Status: AC
  Filled 2018-09-05: qty 100

## 2018-09-05 MED ORDER — LOSARTAN POTASSIUM 50 MG PO TABS
100.0000 mg | ORAL_TABLET | Freq: Every day | ORAL | Status: DC
  Administered 2018-09-05 – 2018-09-11 (×7): 100 mg via ORAL
  Filled 2018-09-05 (×7): qty 2

## 2018-09-05 MED ORDER — LIP MEDEX EX OINT
TOPICAL_OINTMENT | CUTANEOUS | Status: AC
  Administered 2018-09-05: 11:00:00
  Filled 2018-09-05: qty 7

## 2018-09-05 MED ORDER — IOPAMIDOL (ISOVUE-370) INJECTION 76%
100.0000 mL | Freq: Once | INTRAVENOUS | Status: AC | PRN
  Administered 2018-09-05: 100 mL via INTRAVENOUS

## 2018-09-05 MED ORDER — ENOXAPARIN SODIUM 100 MG/ML ~~LOC~~ SOLN
1.0000 mg/kg | Freq: Two times a day (BID) | SUBCUTANEOUS | Status: DC
  Administered 2018-09-05 – 2018-09-08 (×7): 85 mg via SUBCUTANEOUS
  Filled 2018-09-05 (×7): qty 1

## 2018-09-05 MED ORDER — SODIUM CHLORIDE (PF) 0.9 % IJ SOLN
INTRAMUSCULAR | Status: AC
  Filled 2018-09-05: qty 50

## 2018-09-05 NOTE — Progress Notes (Signed)
Report called to [place] on 4th floor. Spoke with Dr Excell Seltzer regarding report from [place] of D Dimer of 3.9.  Patient transferred to 1411 via bed on O2 by two NTs. Donne Hazel, RN

## 2018-09-05 NOTE — Addendum Note (Signed)
Addendum  created 09/05/18 5992 by Lollie Sails, CRNA   [facility] [employer] section accepted

## 2018-09-05 NOTE — Progress Notes (Signed)
Patient ID: [name], female   DOB: 1934-12-10, 84 y.o.   MRN: [id number]  1 Day Post-Op Subjective: Pt recovering well thus far.   Objective: Vital signs in last 24 hours: Temp:  [96 F (35.6 C)-99.2 F (37.3 C)] 98.4 F (36.9 C) (12/31 1630) Pulse Rate:  [56-85] 60 (12/31 1630) Resp:  [14-27] 16 (12/31 1256) BP: (125-182)/(43-74) 156/74 (12/31 1630) SpO2:  [82 %-100 %] 93 % (12/31 1633) Arterial Line BP: (141-150)/(59-102) 150/102 (12/30 1800)  Intake/Output from previous day: 12/30 0701 - 12/31 0700 In: 3318.3 [I.V.:3318.3] Out: 2250 [Urine:2050; Emesis/NG output:100; Stool:25; Blood:75] Intake/Output this shift: Total I/O In: 400 [I.V.:400] Out: 750 [Urine:750]  Physical Exam:  General: Alert and oriented Abdomen: Soft, ND GU: Urine mostly clear with some sediment  Lab Results: Recent Labs    09/05/18 0355  HGB 10.2*  HCT 32.8*   BMET Recent Labs    09/05/18 0355  NA 137  K 4.4  CL 102  CO2 25  GLUCOSE 152*  BUN 18  CREATININE 0.90  CALCIUM 8.7*     Studies/Results:   Assessment/Plan: Colovesical fistula:  Leave catheter indwelling.  Plans for cystogram as outpatient next week as scheduled.   LOS: 1 day   Willow Shidler,LES 09/05/2018, 5:30 PM

## 2018-09-05 NOTE — Progress Notes (Signed)
Initial Nutrition Assessment  DOCUMENTATION CODES:   Obesity unspecified  INTERVENTION:   Once diet advanced, recommend Ensure Max daily, each provides 150 kcal and 30g protein.  NUTRITION DIAGNOSIS:   Increased nutrient needs related to post-op healing as evidenced by estimated needs.  GOAL:   Patient will meet greater than or equal to 90% of their needs  MONITOR:   Diet advancement, Labs, Weight trends, I & O's  REASON FOR ASSESSMENT:   Malnutrition Screening Tool   ASSESSMENT:   83 y.o. patient with history of sigmoid colon diverticulitis.  12/30: s/p LAPAROSCOPIC ASSISTED SIGMOID COLECTOMY  RIGID SIGMOIDOSCOPY REPAIR OF BLADDER (N/A) CYSTOSCOPY WITH LEFT STENT PLACEMENT, BLADDER REPAIR, CYSTOSCOPY WITH LEFT STENT REMOVAL (Left)  Patient currently NPO. Pt would benefit from nutritional supplementation once diet is advanced given post-op healing. Recommend Ensure Max.   Per weight records, pt has lost 8 lb since 11/1 (4% wt loss x 2 months, insignificant for time frame).   Labs reviewed. Medications reviewed.  NUTRITION - FOCUSED PHYSICAL EXAM:  Nutrition focused physical exam shows no sign of depletion of muscle mass or body fat.  Diet Order:   Diet Order            Diet NPO time specified Except for: Ice Chips, Sips with Meds  Diet effective now              EDUCATION NEEDS:   Not appropriate for education at this time  Skin:  Skin Assessment: Reviewed RN Assessment  Last BM:     Height:   Ht Readings from Last 1 Encounters:  09/04/18 5\' 6"  (1.676 m)    Weight:   Wt Readings from Last 1 Encounters:  09/04/18 85.3 kg    Ideal Body Weight:  59.1 kg  BMI:  Body mass index is 30.36 kg/m.  Estimated Nutritional Needs:   Kcal:  1500-1700  Protein:  70-80g  Fluid:  1.7L/day  Clayton Bibles, MS, RD, LDN [place] [facility] Pager: [phone] After Hours Pager: [phone]

## 2018-09-05 NOTE — Progress Notes (Signed)
CRITICAL VALUE ALERT  Critical Value: EKG  Date & Time Notied: 09/05/2018 1640  Provider Notified: Dr. Excell Seltzer  Orders Received/Actions taken: awaiting orders

## 2018-09-05 NOTE — Progress Notes (Signed)
ANTICOAGULATION CONSULT NOTE - Initial Consult  Pharmacy Consult for Lovenox while Eliquis on hold Indication: atrial fibrillation  Allergies  Allergen Reactions  . Contrast Media [Iodinated Diagnostic Agents] Hives and Other (See Comments)    Per pt strong burning sensation starting in chest radiating outward   . Clonidine Derivatives Other (See Comments)    Throat dry  . Losartan Itching  . Statins Other (See Comments)    "bones hurt"  . Sulfa Antibiotics Diarrhea    Tremors   . Celebrex [Celecoxib] Rash  . Isosorbide Nitrate Itching and Rash  . Other Itching and Rash    Plastic and paper tape and heart monitor pads  . Tape Itching and Rash    Red Where applied and will spread    Patient Measurements: Height: 5\' 6"  (167.6 cm) Weight: 188 lb 2 oz (85.3 kg) IBW/kg (Calculated) : 59.3  Vital Signs: Temp: 98.4 F (36.9 C) (12/31 1630) Temp Source: Oral (12/31 1630) BP: 156/74 (12/31 1630) Pulse Rate: 60 (12/31 1630)  Labs: Recent Labs    09/05/18 0355  HGB 10.2*  HCT 32.8*  PLT 265  CREATININE 0.90    Estimated Creatinine Clearance: 53.9 mL/min (by C-G formula based on SCr of 0.9 mg/dL).   Medical History: Past Medical History:  Diagnosis Date  . Allergy    rhinitis  . Aortic stenosis    mild by echo 06/2017  . Arthritis   . Bradycardia    a. 10/2017 -> beta blocker cut back due to HR 39.  . Breast cancer ([place]) 01/06/2012  . Cancer [facility])    right colon and left breast  . Chronic diastolic CHF (congestive heart failure) ([place])   . Colon cancer ([place]) 01/06/2012  . Colovesical fistula    Dr. Marlou Starks and [name] planning surgery 08/2018  . COPD (chronic obstructive pulmonary disease) ([place])    pt. denies  . Coronary artery disease 2006   a.  NSTEMI in 2016, cath showed 15% prox-mid RCA, 20% prox LAD, EF 25-35% by cath and 35-40% -> felt due to Takotsubo cardiomyopathy.  . Dilated aortic root ([place])    62mmHg by echo 06/2017  . Diverticulosis   . Dyspnea    . Edema extremities   . GERD (gastroesophageal reflux disease)   . Hernia   . Hiatal hernia    denies  . Hyperlipidemia   . Hypertension   . Mild aortic stenosis    echo 11/2015 but not noted on echo 06/2016  . Osteopenia   . Permanent atrial fibrillation    chronic atrial fibrillation  . Pneumonia    hx child  . Pulmonary HTN ([place])    a. moderate to severe PASP 59mmHg echo 11/2015 - now 46mmHg by echo 06/2017. CTA chest in 11/16 with no PE. PFTs in 7/15 with mild obstructive lung disease. She had a negative sleep study in 2017. b. Felt due to left sided HF.  Marland Kitchen Stroke ([place])   . Takotsubo syndrome 07/29/2015   a. EF 35-40% by echo; akinesis of mid-apical anteroseptal and apical myocardium.  EF now normalized on echo 11/2015    Medications:  Scheduled:  . alvimopan  12 mg Oral BID  . amLODipine  5 mg Oral Daily  . heparin injection (subcutaneous)  5,000 Units Subcutaneous Q8H  . losartan  100 mg Oral Daily  . metoprolol tartrate  5 mg Intravenous Q6H  . pantoprazole (PROTONIX) IV  40 mg Intravenous QHS   Infusions:  . sodium chloride 50  mL/hr at 09/05/18 1416   PRN: morphine injection, nitroGLYCERIN, ondansetron **OR** ondansetron (ZOFRAN) IV  Assessment: 83 yo female on chronic Eliquis for afib, admitted 12/30 with colovesicle fistula.  She is s/p sigmoid colectomy with repair of fistula to bladder.  Has been on sq heparin 5000 unit q8h prophylactic dose post-op (last dose at 14:15); Pharmacy now consulted to begin full dose Lovenox.   Weight ~85kg  CBC: Hgb 10.2 (11.0 pre-op), Plts wnl  SCr 0.9, CrCl ~54 ml/min  Goal of Therapy:  Anti-Xa level 0.6-1 units/ml 4hrs after LMWH dose given Monitor platelets by anticoagulation protocol: Yes   Plan:  Lovenox 85mg  SQ q12h (~1mg /kg q12h) Check CBC at least q72h Monitor closely for signs/symptoms of bleeding  Peggyann Juba, PharmD, BCPS Pager: [phone] 09/05/2018,5:26 PM

## 2018-09-05 NOTE — Progress Notes (Addendum)
Patient ID: [name], female   DOB: 09-Jul-1935, 83 y.o.   MRN: [id number] 1 Day Post-Op   Subjective: Patient had been doing generally pretty well today but I was called by nurses after recent onset of what was described as chest pain.  Also low oxygen saturations.  Patient states that she had sharp pain which she actually indicates just below her left costal margin and not far from 1 of her trocar sites.  States the pain is somewhat similar to what she has had in the past relieved by nitroglycerin.  At the onset vital signs showed heart rate of about 60 and oxygen saturations in the mid 80s.  She was not on her oxygen at that time.  She was given morphine.  She is now on 3 L of oxygen.  She states that he is no longer having the pain.  Oxygen saturations are 94.  No shortness of breath.  Earlier it hurt to take a deep breath but not currently.  No flatus or bowel movement yet.  Slight occasional nausea.  Patient has history of coronary artery disease, aortic stenosis, atrial fibrillation, and pulmonary hypertension.  She has been on prophylactic subcutaneous heparin since surgery, normally on Eliquis.  EKG at the time shows atrial fibrillation and computer read as possible anterior infarct although I really do not see an acute injury pattern.  Objective: Vital signs in last 24 hours: Temp:  [96 F (35.6 C)-99.2 F (37.3 C)] 98.4 F (36.9 C) (12/31 1630) Pulse Rate:  [56-85] 60 (12/31 1630) Resp:  [14-27] 16 (12/31 1256) BP: (123-182)/(41-74) 156/74 (12/31 1630) SpO2:  [82 %-100 %] 93 % (12/31 1633) Arterial Line BP: (141-150)/(59-102) 150/102 (12/30 1800)    Intake/Output from previous day: 12/30 0701 - 12/31 0700 In: 3318.3 [I.V.:3318.3] Out: 2250 [Urine:2050; Emesis/NG output:100; Stool:25; Blood:75] Intake/Output this shift: Total I/O In: 400 [I.V.:400] Out: 750 [Urine:750]  General appearance: alert, cooperative and no distress Resp: clear to auscultation bilaterally and Without  increased work of breathing Cardio: irregularly irregular rhythm GI: Mild diffuse tenderness and appropriate incisional tenderness  Lab Results:  Recent Labs    09/05/18 0355  WBC 12.0*  HGB 10.2*  HCT 32.8*  PLT 265   BMET Recent Labs    09/05/18 0355  NA 137  K 4.4  CL 102  CO2 25  GLUCOSE 152*  BUN 18  CREATININE 0.90  CALCIUM 8.7*    Studies/Results: Dg C-arm 1-60 Min-no Report  Result Date: 09/04/2018 Fluoroscopy was utilized by the requesting physician.  No radiographic interpretation.    Anti-infectives: Anti-infectives (From admission, onward)   Start     Dose/Rate Route Frequency Ordered Stop   09/04/18 1130  cefoTEtan (CEFOTAN) 2 g in sodium chloride 0.9 % 100 mL IVPB     2 g 200 mL/hr over 30 Minutes Intravenous On call to O.R. 09/04/18 1119 09/04/18 1355      Assessment/Plan: s/p Procedure(s): LAPAROSCOPIC ASSISTED SIGMOID COLECTOMY WITH REPAIR OF FISTULA TO BLADDER CYSTOSCOPY WITH LEFT STENT PLACEMENT, BLADDER REPAIR, CYSTOSCOPY WITH LEFT STENT REMOVAL Onset this afternoon of left chest versus left upper quadrant abdominal pain.  Transient low oxygen saturations.  Symptoms relieved after morphine. I suspect this [place] be abdominal pain, incisional or gas.  Cannot rule out cardiac or PE though I doubt.  Will check chest x-ray and troponins.  Check D dimer.  Transfer to telemetry bed.     LOS: 1 day    Edward Jolly 09/05/2018

## 2018-09-05 NOTE — Progress Notes (Signed)
1 Day Post-Op   Subjective/Chief Complaint: No complaints. Feels good   Objective: Vital signs in last 24 hours: Temp:  [96 F (35.6 C)-99.2 F (37.3 C)] 98.4 F (36.9 C) (12/31 1630) Pulse Rate:  [56-85] 60 (12/31 1630) Resp:  [14-27] 16 (12/31 1256) BP: (123-182)/(41-74) 156/74 (12/31 1630) SpO2:  [82 %-100 %] 93 % (12/31 1633) Arterial Line BP: (141-150)/(59-102) 150/102 (12/30 1800)    Intake/Output from previous day: 12/30 0701 - 12/31 0700 In: 3318.3 [I.V.:3318.3] Out: 2250 [Urine:2050; Emesis/NG output:100; Stool:25; Blood:75] Intake/Output this shift: Total I/O In: 400 [I.V.:400] Out: 750 [Urine:750]  General appearance: alert and cooperative Resp: clear to auscultation bilaterally Cardio: regular rate and rhythm GI: soft, mild tenderness. good bs. incision looks good  Lab Results:  Recent Labs    09/05/18 0355  WBC 12.0*  HGB 10.2*  HCT 32.8*  PLT 265   BMET Recent Labs    09/05/18 0355  NA 137  K 4.4  CL 102  CO2 25  GLUCOSE 152*  BUN 18  CREATININE 0.90  CALCIUM 8.7*   PT/INR No results for input(s): LABPROT, INR in the last 72 hours. ABG No results for input(s): PHART, HCO3 in the last 72 hours.  Invalid input(s): PCO2, PO2  Studies/Results: Dg C-arm 1-60 Min-no Report  Result Date: 09/04/2018 Fluoroscopy was utilized by the requesting physician.  No radiographic interpretation.    Anti-infectives: Anti-infectives (From admission, onward)   Start     Dose/Rate Route Frequency Ordered Stop   09/04/18 1130  cefoTEtan (CEFOTAN) 2 g in sodium chloride 0.9 % 100 mL IVPB     2 g 200 mL/hr over 30 Minutes Intravenous On call to O.R. 09/04/18 1119 09/04/18 1355      Assessment/Plan: s/p Procedure(s): LAPAROSCOPIC ASSISTED SIGMOID COLECTOMY WITH REPAIR OF FISTULA TO BLADDER (N/A) CYSTOSCOPY WITH LEFT STENT PLACEMENT, BLADDER REPAIR, CYSTOSCOPY WITH LEFT STENT REMOVAL (Left) Stay on ice chips for now.  Continue entereg OOB to  chair Will start therapeutic lovenox today Foley stays in 1 week. Urology will study before removal  LOS: 1 day    [name] 09/05/2018

## 2018-09-06 LAB — TROPONIN I: Troponin I: 0.1 ng/mL (ref <0.03)

## 2018-09-06 MED ORDER — CLONAZEPAM 0.5 MG PO TABS
0.5000 mg | ORAL_TABLET | Freq: Two times a day (BID) | ORAL | Status: DC | PRN
  Administered 2018-09-06 – 2018-09-10 (×5): 0.5 mg via ORAL
  Filled 2018-09-06 (×5): qty 1

## 2018-09-06 NOTE — Progress Notes (Signed)
Patient ID: [name], female   DOB: 1935-06-04, 84 y.o.   MRN: [id number] 2 Days Post-Op   Subjective: She feels better today.  No further chest pain or shortness of breath.  She has had flatus and a loose bowel movement.  Objective: Vital signs in last 24 hours: Temp:  [98 F (36.7 C)-99.5 F (37.5 C)] 98.5 F (36.9 C) (01/01 0541) Pulse Rate:  [60-85] 61 (01/01 0541) Resp:  [16-22] 16 (01/01 0541) BP: (156-186)/(53-74) 172/59 (01/01 0541) SpO2:  [82 %-98 %] 98 % (01/01 0541)    Intake/Output from previous day: 12/31 0701 - 01/01 0700 In: 1248.6 [P.O.:120; I.V.:1128.6] Out: 1650 [Urine:1650] Intake/Output this shift: No intake/output data recorded.  General appearance: alert, cooperative and no distress Resp: clear to auscultation bilaterally and No increased work of breathing GI: Minimal distention.  Very mild appropriate tenderness. Incision/Wound: Dressing intact.  No erythema or unusual drainage  Lab Results:  Recent Labs    09/05/18 0355  WBC 12.0*  HGB 10.2*  HCT 32.8*  PLT 265   BMET Recent Labs    09/05/18 0355  NA 137  K 4.4  CL 102  CO2 25  GLUCOSE 152*  BUN 18  CREATININE 0.90  CALCIUM 8.7*     Studies/Results: Ct Angio Chest Pe W Or Wo Contrast  Result Date: 09/05/2018 CLINICAL DATA:  Acute onset of generalized chest pain, decreased O2 saturation and elevated D-dimer. EXAM: CT ANGIOGRAPHY CHEST WITH CONTRAST TECHNIQUE: Multidetector CT imaging of the chest was performed using the standard protocol during bolus administration of intravenous contrast. Multiplanar CT image reconstructions and MIPs were obtained to evaluate the vascular anatomy. CONTRAST:  173mL ISOVUE-370 IOPAMIDOL (ISOVUE-370) INJECTION 76% COMPARISON:  Chest radiograph performed earlier today at 5:35 p.m., and CTA of the chest performed 07/28/2015 FINDINGS: Cardiovascular:  There is no evidence of pulmonary embolus. There is mild dilatation of the ascending thoracic aorta to 4.3  cm in AP dimension; this Lusher remain within physiologic limits, given the patient's age. Scattered calcification is noted along the aortic arch. The great vessels are unremarkable in appearance. The heart is borderline enlarged. Diffuse coronary artery calcifications are seen. Mediastinum/Nodes: A borderline prominent 1.1 cm superior mediastinal node is noted. No additional mediastinal lymphadenopathy is seen. No pericardial effusion is identified. A 2.3 cm cystic focus is noted at the posterior aspect of the left thyroid lobe. No axillary lymphadenopathy is seen. Lungs/Pleura: Small bilateral pleural effusions are noted. Bibasilar airspace opacities Hodgman reflect atelectasis or possibly mild pneumonia. Mild interstitial prominence could reflect interstitial edema. No pneumothorax is seen. A calcified granuloma is noted at the right upper lobe. Upper Abdomen: The visualized portions of the liver are unremarkable. Scattered calcified granulomata are seen within the spleen. Musculoskeletal: No acute osseous abnormalities are identified. The visualized musculature is unremarkable in appearance. Review of the MIP images confirms the above findings. IMPRESSION: 1. No evidence of pulmonary embolus. 2. Small bilateral pleural effusions. Bibasilar airspace opacities Hypes reflect atelectasis or possibly mild pneumonia. Mild interstitial prominence could reflect interstitial edema. 3. Borderline prominent 1.1 cm superior mediastinal node. 4. 2.3 cm cystic focus at the posterior aspect of the left thyroid lobe. Consider further evaluation with thyroid ultrasound. If patient is clinically hyperthyroid, consider nuclear medicine thyroid uptake and scan. Electronically Signed   By: Garald Balding M.D.   On: 09/05/2018 23:51   Dg Chest Port 1 View  Result Date: 09/05/2018 CLINICAL DATA:  Postop, colon surgery EXAM: PORTABLE CHEST 1 VIEW COMPARISON:  12/09/2017 FINDINGS: Cardiomegaly. Subsegmental atelectasis at the left base.  Trace left pleural effusions. Aortic atherosclerosis. No pneumothorax. IMPRESSION: 1. Cardiomegaly with trace left pleural effusion. 2. Coarse pulmonary interstitium, likely chronic change. Subsegmental atelectasis at the left base. Electronically Signed   By: Donavan Foil M.D.   On: 09/05/2018 18:14   Dg C-arm 1-60 Min-no Report  Result Date: 09/04/2018 Fluoroscopy was utilized by the requesting physician.  No radiographic interpretation.    Anti-infectives: Anti-infectives (From admission, onward)   Start     Dose/Rate Route Frequency Ordered Stop   09/04/18 1130  cefoTEtan (CEFOTAN) 2 g in sodium chloride 0.9 % 100 mL IVPB     2 g 200 mL/hr over 30 Minutes Intravenous On call to O.R. 09/04/18 1119 09/04/18 1355      Assessment/Plan: s/p Procedure(s): LAPAROSCOPIC ASSISTED SIGMOID COLECTOMY WITH REPAIR OF FISTULA TO BLADDER CYSTOSCOPY WITH LEFT STENT PLACEMENT, BLADDER REPAIR, CYSTOSCOPY WITH LEFT STENT REMOVAL History of coronary artery disease, atrial fibrillation and aortic stenosis, pulmonary hypertension Episode of chest pain and low O2 saturations yesterday.  CT angio chest negative for PE.  EKG did not show acute changes and her chest pain is resolved.  I and her nurse recall a verbal report of a negative troponin but we cannot find any record in the chart of a resulted troponin.  Will repeat this morning but low suspicion of cardiac problems. Ileus appears to be resolving.  Start clear liquid diet.   LOS: 2 days    Edward Jolly 09/06/2018

## 2018-09-06 NOTE — Plan of Care (Signed)

## 2018-09-06 NOTE — Progress Notes (Signed)
Patient ID: [name], female   DOB: 02-12-1935, 84 y.o.   MRN: [id number]  2 Days Post-Op Subjective: Pt with hypoxemia yesterday.  CT angiogram negative.  Currently denies SOB.    Objective: Vital signs in last 24 hours: Temp:  [98 F (36.7 C)-99.5 F (37.5 C)] 98.5 F (36.9 C) (01/01 0541) Pulse Rate:  [60-85] 61 (01/01 0541) Resp:  [16-22] 16 (01/01 0541) BP: (156-186)/(53-74) 172/59 (01/01 0541) SpO2:  [82 %-98 %] 98 % (01/01 0541)  Intake/Output from previous day: 12/31 0701 - 01/01 0700 In: 1248.6 [P.O.:120; I.V.:1128.6] Out: 1650 [Urine:1650] Intake/Output this shift: Total I/O In: -  Out: 125 [Urine:125]  Physical Exam:  General: Alert and oriented GU: Urine clearing with no debris within urine today  Lab Results: Recent Labs    09/05/18 0355  HGB 10.2*  HCT 32.8*   BMET Recent Labs    09/05/18 0355  NA 137  K 4.4  CL 102  CO2 25  GLUCOSE 152*  BUN 18  CREATININE 0.90  CALCIUM 8.7*     Studies/Results:   Assessment/Plan: Colovesical fistula s/p repair: Plan to f/u as scheduled with me next week for cystogram in our office.  Continue Foley catheter drainage upon discharge.   LOS: 2 days   Rito Lecomte,LES 09/06/2018, 9:35 AM

## 2018-09-06 NOTE — Progress Notes (Signed)
CRITICAL VALUE ALERT  Critical Value:  Troponin 0.10  Date & Time Notied:  09/06/2018 10:50  Provider Notified: Dr. Barry Dienes  Orders Received/Actions taken: no new orders received.

## 2018-09-07 ENCOUNTER — Telehealth: Payer: Self-pay

## 2018-09-07 DIAGNOSIS — I10 Essential (primary) hypertension: Secondary | ICD-10-CM

## 2018-09-07 DIAGNOSIS — I4821 Permanent atrial fibrillation: Secondary | ICD-10-CM

## 2018-09-07 DIAGNOSIS — R7989 Other specified abnormal findings of blood chemistry: Secondary | ICD-10-CM

## 2018-09-07 LAB — CBC
HCT: 31 % — ABNORMAL LOW (ref 36.0–46.0)
Hemoglobin: 9.2 g/dL — ABNORMAL LOW (ref 12.0–15.0)
MCH: 28.4 pg (ref 26.0–34.0)
MCHC: 29.7 g/dL — AB (ref 30.0–36.0)
MCV: 95.7 fL (ref 80.0–100.0)
Platelets: 180 10*3/uL (ref 150–400)
RBC: 3.24 MIL/uL — ABNORMAL LOW (ref 3.87–5.11)
RDW: 14.9 % (ref 11.5–15.5)
WBC: 9.1 10*3/uL (ref 4.0–10.5)
nRBC: 0 % (ref 0.0–0.2)

## 2018-09-07 LAB — TROPONIN I: TROPONIN I: 0.07 ng/mL — AB (ref <0.03)

## 2018-09-07 LAB — CREATININE, SERUM
Creatinine, Ser: 0.68 mg/dL (ref 0.44–1.00)
GFR calc Af Amer: >60 mL/min (ref >60)
GFR calc non Af Amer: >60 mL/min (ref >60)

## 2018-09-07 MED ORDER — SPIRONOLACTONE 12.5 MG HALF TABLET
12.5000 mg | ORAL_TABLET | Freq: Every day | ORAL | Status: DC
  Administered 2018-09-07 – 2018-09-11 (×5): 12.5 mg via ORAL
  Filled 2018-09-07 (×6): qty 1

## 2018-09-07 MED ORDER — FUROSEMIDE 10 MG/ML IJ SOLN
40.0000 mg | Freq: Every day | INTRAMUSCULAR | Status: DC
  Administered 2018-09-08: 40 mg via INTRAVENOUS
  Filled 2018-09-07: qty 4

## 2018-09-07 MED ORDER — METOPROLOL SUCCINATE ER 25 MG PO TB24
25.0000 mg | ORAL_TABLET | Freq: Every day | ORAL | Status: DC
  Administered 2018-09-07 – 2018-09-10 (×4): 25 mg via ORAL
  Filled 2018-09-07 (×5): qty 1

## 2018-09-07 NOTE — Progress Notes (Signed)
Patient ID: [name], female   DOB: [birth year], 84 y.o.   MRN: [id number]  3 Days Post-Op Subjective: No changes or new complaints overnight.  Objective: Vital signs in last 24 hours: Temp:  [98.5 F (36.9 C)-99 F (37.2 C)] 99 F (37.2 C) (01/01 2056) Pulse Rate:  [56-81] 56 (01/01 2303) Resp:  [16-20] 20 (01/01 2056) BP: (147-172)/(54-86) 147/54 (01/01 2303) SpO2:  [81 %-98 %] 94 % (01/01 2056)  Intake/Output from previous day: 01/01 0701 - 01/02 0700 In: 1405.7 [P.O.:420; I.V.:985.7] Out: 700 [Urine:700] Intake/Output this shift: Total I/O In: 550 [I.V.:550] Out: 250 [Urine:250]  Physical Exam:  General: Alert and oriented GU:  Urine draining well and mostly clear  Lab Results: Recent Labs    09/05/18 0355  HGB 10.2*  HCT 32.8*   BMET Recent Labs    09/05/18 0355  NA 137  K 4.4  CL 102  CO2 25  GLUCOSE 152*  BUN 18  CREATININE 0.90  CALCIUM 8.7*     Studies/Results:   Assessment/Plan: F/U as outpatient next week as planned for cystogram.  Will sign off.  Please call if further urologic questions.  If patient remains hospitalized for some reason through next week, we certainly can consider performing her cystogram as an inpatient.   LOS: 3 days   Jamesen Stahnke,LES 09/07/2018, 5:28 AM

## 2018-09-07 NOTE — Consult Note (Signed)
Cardiology Consultation:   Patient ID: [name] MRN: [id number]; DOB: [birth year]  Admit date: 09/04/2018 Date of Consult: 09/07/2018  Primary Care Provider: [name], MD Primary Cardiologist: Fransico Him, MD  Advanced heart failure: Dr. Aundra Dubin  Patient Profile:   [name] is a 84 y.o. female with a hx of Takotsubo CM 2016, with recovered EF, nonobstructive CAD, HTN, chronic diastolic heart failure, pulmonary hypertension, permanent atrial fibrillation who is being seen today for the evaluation of postoperative elevated troponins at the request of Dr. Marlou Starks.  History of Present Illness:   [name] underwent surgery on 09/04/18 for laparoscopic sigmoid colectomy with cystoscopy and temporary left ureteral stent placement for colovesicle fistula, performed by Dr. Marlou Starks and [name].   On 12/31, she had an episode of sharp chest pain (left costal, near trocar site) and low O2 sats in the mid 80s. It had also been painful to take a deep breath. Her pain improved with morphine, and her O2 sats improved with nasal cannula. ECG was done without acute ischemia (similar pattern to prior). D-dimer elevated at 3.92, but CT PE negative for PE. Troponin 0.01 --> 0.07.   She currently denies any chest pain. Breathing feels near her baseline. No diaphoresis.   Past Medical History:  Diagnosis Date  . Allergy    rhinitis  . Aortic stenosis    mild by echo 06/2017  . Arthritis   . Bradycardia    a. 10/2017 -> beta blocker cut back due to HR 39.  . Breast cancer ([place]) 01/06/2012  . Cancer [facility])    right colon and left breast  . Chronic diastolic CHF (congestive heart failure) ([place])   . Colon cancer ([place]) 01/06/2012  . Colovesical fistula    Dr. Marlou Starks and [name] planning surgery 08/2018  . COPD (chronic obstructive pulmonary disease) ([place])    pt. denies  . Coronary artery disease 2006   a.  NSTEMI in 2016, cath showed 15% prox-mid RCA, 20% prox LAD, EF 25-35% by cath and 35-40% -> felt  due to Takotsubo cardiomyopathy.  . Dilated aortic root ([place])    95mmHg by echo 06/2017  . Diverticulosis   . Dyspnea   . Edema extremities   . GERD (gastroesophageal reflux disease)   . Hernia   . Hiatal hernia    denies  . Hyperlipidemia   . Hypertension   . Mild aortic stenosis    echo 11/2015 but not noted on echo 06/2016  . Osteopenia   . Permanent atrial fibrillation    chronic atrial fibrillation  . Pneumonia    hx child  . Pulmonary HTN ([place])    a. moderate to severe PASP 81mmHg echo 11/2015 - now 38mmHg by echo 06/2017. CTA chest in 11/16 with no PE. PFTs in 7/15 with mild obstructive lung disease. She had a negative sleep study in 2017. b. Felt due to left sided HF.  Marland Kitchen Stroke ([place])   . Takotsubo syndrome 07/29/2015   a. EF 35-40% by echo; akinesis of mid-apical anteroseptal and apical myocardium.  EF now normalized on echo 11/2015    Past Surgical History:  Procedure Laterality Date  . APPENDECTOMY    . BREAST SURGERY     lumpectomy left  . CARDIAC CATHETERIZATION    . CARDIAC CATHETERIZATION N/A 07/28/2015   Procedure: Left Heart Cath and Coronary Angiography;  Surgeon: Peter M Martinique, MD;  Location: [facility];  Service: Cardiovascular;  Laterality: N/A;  . CHOLECYSTECTOMY    .  COLECTOMY     right side  . CYSTOSCOPY WITH STENT PLACEMENT Left 09/04/2018   Procedure: CYSTOSCOPY WITH LEFT STENT PLACEMENT, BLADDER REPAIR, CYSTOSCOPY WITH LEFT STENT REMOVAL;  Surgeon: Raynelle Bring, MD;  Location: [facility];  Service: Urology;  Laterality: Left;  . EXCISION OF ACCESSORY NIPPLE Bilateral 05/30/2013   Procedure: BILATERAL NIPPLE BIOPSY;  Surgeon: Merrie Roof, MD;  Location: [facility];  Service: General;  Laterality: Bilateral;  . EYE SURGERY Bilateral 12   cataracts  . IR RADIOLOGIST EVAL & MGMT  07/26/2017  . LAPAROSCOPIC RIGHT COLECTOMY N/A 09/04/2018   Procedure: LAPAROSCOPIC ASSISTED SIGMOID COLECTOMY WITH REPAIR OF FISTULA TO BLADDER;  Surgeon: Jovita Kussmaul,  MD;  Location: [facility];  Service: General;  Laterality: N/A;  . SPLIT NIGHT STUDY  02/02/2016         Home Medications:  Prior to Admission medications   Medication Sig Start Date End Date Taking? Authorizing Provider  acetaminophen (TYLENOL) 500 MG tablet Take 500-1,000 mg by mouth every 6 (six) hours as needed for headache (pain).   Yes [provider]  amLODipine (NORVASC) 5 MG tablet TAKE 1 TABLET BY MOUTH ONCE DAILY Patient taking differently: Take 5 mg by mouth daily.  07/27/18  Yes Larey Dresser, MD  amoxicillin-clavulanate (AUGMENTIN) 500-125 MG tablet Take 1 tablet by mouth 2 (two) times daily. 08/08/18  Yes [provider]  apixaban (ELIQUIS) 5 MG TABS tablet Take 1 tablet (5 mg total) by mouth 2 (two) times daily. Patient taking differently: Take 5 mg by mouth 2 (two) times daily.  05/09/18  Yes Turner, Eber Hong, MD  augmented betamethasone dipropionate (DIPROLENE-AF) 0.05 % ointment Apply 1 application topically 2 (two) times daily as needed (for skin irritation/rashes.).  08/02/18  Yes [provider]  clonazePAM (KLONOPIN) 0.5 MG tablet TAKE 1 TABLET BY MOUTH TWICE DAILY AS NEEDED FOR ANXIETY Patient taking differently: Take 0.5 mg by mouth 2 (two) times daily as needed (anxiety/sleep.).  09/26/17  Yes Dena Billet B, PA-C  cloNIDine (CATAPRES) 0.1 MG tablet Take 1 tablet (0.1 mg total) by mouth 2 (two) times daily. 12/26/17  Yes Turner, Eber Hong, MD  losartan (COZAAR) 100 MG tablet TAKE 1 TABLET BY MOUTH ONCE DAILY Patient taking differently: Take 100 mg by mouth daily.  07/03/18  Yes Larey Dresser, MD  metoprolol succinate (TOPROL XL) 25 MG 24 hr tablet Take 1 tablet (25 mg total) by mouth daily. 03/28/18  Yes Turner, Eber Hong, MD  metroNIDAZOLE (FLAGYL) 500 MG tablet Take 1,000 mg by mouth See admin instructions. Take 2 tablets (1000 mg) by mouth at 2 pm, 3 pm, & 10 pm the day prior to your colon operation 08/01/18  Yes [provider]  mirtazapine  (REMERON) 15 MG tablet TAKE 1 TABLET BY MOUTH AT BEDTIME Patient taking differently: Take 15 mg by mouth at bedtime.  05/10/18  Yes Dena Billet B, PA-C  neomycin (MYCIFRADIN) 500 MG tablet Take 1,000 mg by mouth See admin instructions. Take 2 tablets (1000 mg) by mouth at 2 pm, 3 pm, & 10 pm the day prior to your colon operation 08/01/18  Yes [provider]  nitroGLYCERIN (NITROSTAT) 0.4 MG SL tablet PLACE ONE TABLET UNDER THE TONGUE EVERY FIVE MINUTES AS NEEDED FOR CHEST PAIN. Dambrosia REPEAT FOR THREE DOSES. Patient taking differently: Place 0.4 mg under the tongue every 5 (five) minutes x 3 doses as needed for chest pain.  08/10/18  Yes [name], MD  spironolactone (ALDACTONE) 25 MG tablet TAKE 1/2 (ONE-HALF) TABLET BY MOUTH ONCE DAILY Patient taking differently: Take 12.5 mg by mouth daily.  12/16/17  Yes Turner, Eber Hong, MD  torsemide (DEMADEX) 20 MG tablet Take 3 tablets (60 mg total) by mouth daily. 03/21/18  Yes Turner, Eber Hong, MD  Guaifenesin (MUCINEX MAXIMUM STRENGTH) 1200 MG TB12 Take 1,200 mg by mouth daily as needed (congestion).    [provider]  LUBRICANT EYE DROPS 0.4-0.3 % SOLN Place 1 drop into both eyes 3 (three) times daily as needed (for dry/irritated eyes.).    [provider]  omeprazole (PRILOSEC OTC) 20 MG tablet Take 20 mg by mouth daily as needed (heartburn/indigestion.).     [provider]  ondansetron (ZOFRAN ODT) 4 MG disintegrating tablet Take 1 tablet (4 mg total) by mouth every 8 (eight) hours as needed for nausea or vomiting. 12/26/17   Charlann Lange, PA-C    Inpatient Medications: Scheduled Meds: . amLODipine  5 mg Oral Daily  . enoxaparin (LOVENOX) injection  1 mg/kg Subcutaneous Q12H  . losartan  100 mg Oral Daily  . metoprolol tartrate  5 mg Intravenous Q6H  . pantoprazole (PROTONIX) IV  40 mg Intravenous QHS   Continuous Infusions: . sodium chloride 50 mL/hr at 09/07/18 0600   PRN Meds: clonazePAM, morphine  injection, nitroGLYCERIN, ondansetron **OR** ondansetron (ZOFRAN) IV  Allergies:    Allergies  Allergen Reactions  . Contrast Media [Iodinated Diagnostic Agents] Hives and Other (See Comments)    Per pt strong burning sensation starting in chest radiating outward   . Clonidine Derivatives Other (See Comments)    Throat dry  . Losartan Itching  . Statins Other (See Comments)    "bones hurt"  . Sulfa Antibiotics Diarrhea    Tremors   . Celebrex [Celecoxib] Rash  . Isosorbide Nitrate Itching and Rash  . Other Itching and Rash    Plastic and paper tape and heart monitor pads  . Tape Itching and Rash    Red Where applied and will spread    Social History:   Social History   Socioeconomic History  . Marital status: Married    Spouse name: Not on file  . Number of children: Not on file  . Years of education: Not on file  . Highest education level: Not on file  Occupational History  . Not on file  Social Needs  . Financial resource strain: Not on file  . Food insecurity:    Worry: Not on file    Inability: Not on file  . Transportation needs:    Medical: Not on file    Non-medical: Not on file  Tobacco Use  . Smoking status: Never Smoker  . Smokeless tobacco: Never Used  Substance and Sexual Activity  . Alcohol use: No  . Drug use: No  . Sexual activity: Not Currently  Lifestyle  . Physical activity:    Days per week: Not on file    Minutes per session: Not on file  . Stress: Not on file  Relationships  . Social connections:    Talks on phone: Not on file    Gets together: Not on file    Attends religious service: Not on file    Active member of club or organization: Not on file    Attends meetings of clubs or organizations: Not on file    Relationship status: Not on file  . Intimate partner violence:    Fear of current or ex partner:  Not on file    Emotionally abused: Not on file    Physically abused: Not on file    Forced sexual activity: Not on file    Other Topics Concern  . Not on file  Social History Narrative  . Not on file    Family History:    Family History  Problem Relation Age of Onset  . Heart disease Mother   . Heart attack Mother   . Cancer Sister        stomach and colon  . Heart disease Brother 33  . Hypertension Father   . Cancer Sister   . Stroke Neg Hx      ROS:  Please see the history of present illness.  Review of Systems  Constitutional: Negative for chills and fever.  HENT: Negative for congestion and sore throat.   Eyes: Negative for double vision.  Respiratory: Negative for sputum production and wheezing.   Cardiovascular: Positive for chest pain and leg swelling. Negative for palpitations, orthopnea, claudication and PND.  Gastrointestinal: Positive for abdominal pain.  Genitourinary: Negative for flank pain.  Musculoskeletal: Positive for back pain. Negative for falls.  Neurological: Negative for focal weakness and loss of consciousness.  Endo/Heme/Allergies: Does not bruise/bleed easily.   All other ROS reviewed and negative.     Physical Exam/Data:   Vitals:   09/06/18 2056 09/06/18 2303 09/07/18 0619 09/07/18 1314  BP: (!) 164/86 (!) 147/54 (!) 162/59 (!) 153/54  Pulse: 81 (!) 56 66 61  Resp: 20  20   Temp: 99 F (37.2 C)  99.2 F (37.3 C) 98.3 F (36.8 C)  TempSrc: Oral  Oral Oral  SpO2: 94%  98% 99%  Weight:      Height:        Intake/Output Summary (Last 24 hours) at 09/07/2018 1316 Last data filed at 09/07/2018 0600 Gross per 24 hour  Intake 1296.22 ml  Output 925 ml  Net 371.22 ml   Filed Weights   09/04/18 1147  Weight: 85.3 kg   Body mass index is 30.36 kg/m.  General:  Well nourished, well developed, in no acute distress HEENT: normal Lymph: no adenopathy Neck: no JVD at 90 degrees Endocrine:  No thryomegaly Vascular: RA pulses 2+ bilaterally Cardiac:  Irregular S1 and S2, no murmur/rub/gallop Lungs:  clear to auscultation bilaterally, no wheezing, rhonchi or  rales  Abd: post operative abdomen, mildly tender Ext: trace-1+ bilateral LE edema Musculoskeletal:  No deformities, BUE and BLE strength normal and equal Skin: warm and dry  Neuro:  no focal abnormalities noted Psych:  Normal affect   EKG:  The EKG was personally reviewed and demonstrates:  12/31 afib, poor R wave progression, similar to prior Telemetry:  Telemetry was personally reviewed and demonstrates:  Rate cpntrolled afib  Relevant CV Studies: Echo 08-21-18 - Left ventricle: The cavity size was normal. There was moderate   concentric hypertrophy. Systolic function was normal. The   estimated ejection fraction was in the range of 60% to 65%. Wall   motion was normal; there were no regional wall motion   abnormalities. Features are consistent with a pseudonormal left   ventricular filling pattern, with concomitant abnormal relaxation   and increased filling pressure (grade 2 diastolic dysfunction). - Aortic valve: Trileaflet; moderately thickened, severely   calcified leaflets. There was mild stenosis. There was no   regurgitation. Mean gradient (S): 11 mm Hg. Peak gradient (S): 22   mm Hg. - Ascending aorta: The ascending aorta was normal  in size measuring   39 mm. - Mitral valve: Calcified annulus. Mildly thickened leaflets .   There was mild regurgitation. - Left atrium: The atrium was moderately dilated. - Right atrium: The atrium was moderately dilated. - Tricuspid valve: There was moderate regurgitation. - Pulmonary arteries: Systolic pressure was moderately increased.   PA peak pressure: 53 mm Hg (S). - Inferior vena cava: The vessel was dilated. The respirophasic   diameter changes were blunted (< 50%), consistent with elevated   central venous pressure. - Pericardium, extracardiac: There was no pericardial effusion.  Myoview 02/14/18 Myocardial perfusion is normal. The study is normal. This is a low risk study. Overall left ventricular systolic function was normal.  LV cavity size is normal. Nuclear stress EF: 63%. The left ventricular ejection fraction is normal (55-65%).   Cath 07/28/2015  Prox RCA to Mid RCA lesion, 15% stenosed.  Prox LAD lesion, 20% stenosed.  There is severe left ventricular systolic dysfunction.   1. Minor nonobstructive CAD 2. Severe left ventriclar dysfunction. Wall motion abnormality consistent with Takotsubo cardiomyopathy.  Laboratory Data:  Chemistry Recent Labs  Lab 09/05/18 0355 09/07/18 0531  NA 137  --   K 4.4  --   CL 102  --   CO2 25  --   GLUCOSE 152*  --   BUN 18  --   CREATININE 0.90 0.68  CALCIUM 8.7*  --   GFRNONAA 60* >60  GFRAA >60 >60  ANIONGAP 10  --     No results for input(s): PROT, ALBUMIN, AST, ALT, ALKPHOS, BILITOT in the last 168 hours. Hematology Recent Labs  Lab 09/05/18 0355 09/07/18 0531  WBC 12.0* 9.1  RBC 3.58* 3.24*  HGB 10.2* 9.2*  HCT 32.8* 31.0*  MCV 91.6 95.7  MCH 28.5 28.4  MCHC 31.1 29.7*  RDW 14.6 14.9  PLT 265 180   Cardiac Enzymes Recent Labs  Lab 09/06/18 0927 09/07/18 0853  TROPONINI 0.10* 0.07*   No results for input(s): TROPIPOC in the last 168 hours.  BNPNo results for input(s): BNP, PROBNP in the last 168 hours.  DDimer  Recent Labs  Lab 09/05/18 1751  DDIMER 3.92*    Radiology/Studies:  Ct Angio Chest Pe W Or Wo Contrast  Result Date: 09/05/2018 CLINICAL DATA:  Acute onset of generalized chest pain, decreased O2 saturation and elevated D-dimer. EXAM: CT ANGIOGRAPHY CHEST WITH CONTRAST TECHNIQUE: Multidetector CT imaging of the chest was performed using the standard protocol during bolus administration of intravenous contrast. Multiplanar CT image reconstructions and MIPs were obtained to evaluate the vascular anatomy. CONTRAST:  172mL ISOVUE-370 IOPAMIDOL (ISOVUE-370) INJECTION 76% COMPARISON:  Chest radiograph performed earlier today at 5:35 p.m., and CTA of the chest performed 07/28/2015 FINDINGS: Cardiovascular:  There is no evidence of  pulmonary embolus. There is mild dilatation of the ascending thoracic aorta to 4.3 cm in AP dimension; this Ober remain within physiologic limits, given the patient's age. Scattered calcification is noted along the aortic arch. The great vessels are unremarkable in appearance. The heart is borderline enlarged. Diffuse coronary artery calcifications are seen. Mediastinum/Nodes: A borderline prominent 1.1 cm superior mediastinal node is noted. No additional mediastinal lymphadenopathy is seen. No pericardial effusion is identified. A 2.3 cm cystic focus is noted at the posterior aspect of the left thyroid lobe. No axillary lymphadenopathy is seen. Lungs/Pleura: Small bilateral pleural effusions are noted. Bibasilar airspace opacities [place] reflect atelectasis or possibly mild pneumonia. Mild interstitial prominence could reflect interstitial edema. No pneumothorax is seen. A  calcified granuloma is noted at the right upper lobe. Upper Abdomen: The visualized portions of the liver are unremarkable. Scattered calcified granulomata are seen within the spleen. Musculoskeletal: No acute osseous abnormalities are identified. The visualized musculature is unremarkable in appearance. Review of the MIP images confirms the above findings. IMPRESSION: 1. No evidence of pulmonary embolus. 2. Small bilateral pleural effusions. Bibasilar airspace opacities [place] reflect atelectasis or possibly mild pneumonia. Mild interstitial prominence could reflect interstitial edema. 3. Borderline prominent 1.1 cm superior mediastinal node. 4. 2.3 cm cystic focus at the posterior aspect of the left thyroid lobe. Consider further evaluation with thyroid ultrasound. If patient is clinically hyperthyroid, consider nuclear medicine thyroid uptake and scan. Electronically Signed   By: Garald Balding M.D.   On: 09/05/2018 23:51   Dg Chest Port 1 View  Result Date: 09/05/2018 CLINICAL DATA:  Postop, colon surgery EXAM: PORTABLE CHEST 1 VIEW COMPARISON:   12/09/2017 FINDINGS: Cardiomegaly. Subsegmental atelectasis at the left base. Trace left pleural effusions. Aortic atherosclerosis. No pneumothorax. IMPRESSION: 1. Cardiomegaly with trace left pleural effusion. 2. Coarse pulmonary interstitium, likely chronic change. Subsegmental atelectasis at the left base. Electronically Signed   By: Donavan Foil M.D.   On: 09/05/2018 18:14   Dg C-arm 1-60 Min-no Report  Result Date: 09/04/2018 Fluoroscopy was utilized by the requesting physician.  No radiographic interpretation.    Assessment and Plan:   Chest pain, elevated troponins postoperatively: Quality and location of chest pain atypical, but she did have a small elevation in her troponin level. History of prior nonobstructive CAD. Recent echo and stress test normal. Consider demand post op vs. Minor NSTEMI -she is now asymptomatic. Will order limited repeat echo for wall motion only. If normal, will defer further workup with possible stress test to outpatient. -on therapeutic lovenox for anticoagulation. If ok from post op surgical standpo -has adverse reactions to statins, note that "bones hurt."  -restarted home metoprolol  Hypertension: mildly elevated here. Restarted home metoprolol today, back on her home amlodipine and losartan. Not yet back on her home clonidine.Not on home diuretics yet.  -will restart spironolactone today  Chronic diastolic heart failure: slightly volume up today. Is on torsemide and spironolactone at home. Recommend 60 IV lasix daily and then restarted PO prior to discharge.  Permanent atrial fibrillation: rate controlled, on anticoagulation with lovenox now and apixaban 5 mg BID. CHA2DS2/VAS Stroke Risk Points= 8  For questions or updates, please contact [facility] Please consult [email or web address] for contact info under   Signed, Buford Dresser, MD  09/07/2018 1:16 PM

## 2018-09-07 NOTE — Care Management Important Message (Signed)
Important Message  Patient Details  Name: Joanne Salah Paullin MRN: [id number] Date of Birth: 1935/08/02   Medicare Important Message Given:  Yes    Kerin Salen 09/07/2018, 10:13 AMImportant Message  Patient Details  Name: Doreene Forrey Carraway MRN: [id number] Date of Birth: 11/06/1934   Medicare Important Message Given:  Yes    Kerin Salen 09/07/2018, 10:13 AM

## 2018-09-07 NOTE — Progress Notes (Signed)
Pharmacy Brief Note - Alvimopan (Entereg)  The standing order set for alvimopan (Entereg) now includes an automatic order to discontinue the drug after the patient has had a bowel movement.  The change was approved by the [place] and the Medical Executive Committee.    This patient has had a bowel movement documented by nursing.  Therefore, alvimopan has been discontinued.  If there are questions, please contact the pharmacy at [phone].  Thank you  Gretta Arab PharmD, BCPS Pager [phone] 09/07/2018 7:46 AM

## 2018-09-07 NOTE — Telephone Encounter (Signed)
She is still inpatient, will once we know discharge date.

## 2018-09-07 NOTE — Telephone Encounter (Signed)
Dr. Marlou Starks did not mention, he stated post-hospitalization close follow up.

## 2018-09-07 NOTE — Progress Notes (Signed)
[facility] Progress Note:   3 Days Post-Op  Subjective: Mental status is clear.  Sitting up.  On tele 1411.   Objective: Vital signs in last 24 hours: Temp:  [99 F (37.2 C)-99.2 F (37.3 C)] 99.2 F (37.3 C) (01/02 0619) Pulse Rate:  [56-81] 66 (01/02 0619) Resp:  [20] 20 (01/02 0619) BP: (147-164)/(54-86) 162/59 (01/02 0619) SpO2:  [81 %-98 %] 98 % (01/02 0619)  Intake/Output from previous day: 01/01 0701 - 01/02 0700 In: 1596.2 [P.O.:420; I.V.:1176.2] Out: 1050 [Urine:1050] Intake/Output this shift: No intake/output data recorded.  Physical Exam: Work of breathing is not labored.  Not hungry.  On liquids.    Lab Results:  Results for orders placed or performed during the hospital encounter of 09/04/18 (from the past 48 hour(s))  D-dimer, quantitative (not at [facility])     Status: Abnormal   Collection Time: 09/05/18  5:51 PM  Result Value Ref Range   D-Dimer, Quant 3.92 (H) 0.00 - 0.50 ug/mL-FEU    Comment: (NOTE) At the manufacturer cut-off of 0.50 ug/mL FEU, this assay has been documented to exclude PE with a sensitivity and negative predictive value of 97 to 99%.  At this time, this assay has not been approved by the FDA to exclude DVT/VTE. Results should be correlated with clinical presentation. Performed at [facility], [place] [address] [place], [place] [address]   Troponin I - ONCE - STAT     Status: Abnormal   Collection Time: 09/06/18  9:27 AM  Result Value Ref Range   Troponin I 0.10 (HH) <0.03 ng/mL    Comment: CRITICAL RESULT CALLED TO, READ BACK BY AND VERIFIED WITH: HOLDERNESS,H. RN AT 1020 09/06/18 MULLINS,T Performed at [facility], [place] [address] [place], [place] [address]   Creatinine, serum     Status: None   Collection Time: 09/07/18  5:31 AM  Result Value Ref Range   Creatinine, Ser 0.68 0.44 - 1.00 mg/dL   GFR calc non Af Amer >60 >60 mL/min   GFR calc Af Amer >60 >60 mL/min    Comment:  Performed at [facility], [place] [address], [place], [place] [address]  CBC     Status: Abnormal   Collection Time: 09/07/18  5:31 AM  Result Value Ref Range   WBC 9.1 4.0 - 10.5 K/uL   RBC 3.24 (L) 3.87 - 5.11 MIL/uL   Hemoglobin 9.2 (L) 12.0 - 15.0 g/dL   HCT 31.0 (L) 36.0 - 46.0 %   MCV 95.7 80.0 - 100.0 fL   MCH 28.4 26.0 - 34.0 pg   MCHC 29.7 (L) 30.0 - 36.0 g/dL   RDW 14.9 11.5 - 15.5 %   Platelets 180 150 - 400 K/uL   nRBC 0.0 0.0 - 0.2 %    Comment: Performed at [facility], [place] [address], [place],  [address]    Radiology/Results: Ct Angio Chest Pe W Or Wo Contrast  Result Date: 09/05/2018 CLINICAL DATA:  Acute onset of generalized chest pain, decreased O2 saturation and elevated D-dimer. EXAM: CT ANGIOGRAPHY CHEST WITH CONTRAST TECHNIQUE: Multidetector CT imaging of the chest was performed using the standard protocol during bolus administration of intravenous contrast. Multiplanar CT image reconstructions and MIPs were obtained to evaluate the vascular anatomy. CONTRAST:  19mL ISOVUE-370 IOPAMIDOL (ISOVUE-370) INJECTION 76% COMPARISON:  Chest radiograph performed earlier today at 5:35 p.m., and CTA of the chest performed 07/28/2015 FINDINGS: Cardiovascular:  There is no evidence of  pulmonary embolus. There is mild dilatation of the ascending thoracic aorta to 4.3 cm in AP dimension; this Victory remain within physiologic limits, given the patient's age. Scattered calcification is noted along the aortic arch. The great vessels are unremarkable in appearance. The heart is borderline enlarged. Diffuse coronary artery calcifications are seen. Mediastinum/Nodes: A borderline prominent 1.1 cm superior mediastinal node is noted. No additional mediastinal lymphadenopathy is seen. No pericardial effusion is identified. A 2.3 cm cystic focus is noted at the posterior aspect of the left thyroid lobe. No axillary lymphadenopathy is seen. Lungs/Pleura:  Small bilateral pleural effusions are noted. Bibasilar airspace opacities [place]edios reflect atelectasis or possibly mild pneumonia. Mild interstitial prominence could reflect interstitial edema. No pneumothorax is seen. A calcified granuloma is noted at the right upper lobe. Upper Abdomen: The visualized portions of the liver are unremarkable. Scattered calcified granulomata are seen within the spleen. Musculoskeletal: No acute osseous abnormalities are identified. The visualized musculature is unremarkable in appearance. Review of the MIP images confirms the above findings. IMPRESSION: 1. No evidence of pulmonary embolus. 2. Small bilateral pleural effusions. Bibasilar airspace opacities [place]ner reflect atelectasis or possibly mild pneumonia. Mild interstitial prominence could reflect interstitial edema. 3. Borderline prominent 1.1 cm superior mediastinal node. 4. 2.3 cm cystic focus at the posterior aspect of the left thyroid lobe. Consider further evaluation with thyroid ultrasound. If patient is clinically hyperthyroid, consider nuclear medicine thyroid uptake and scan. Electronically Signed   By: Garald Balding M.D.   On: 09/05/2018 23:51   Dg Chest Port 1 View  Result Date: 09/05/2018 CLINICAL DATA:  Postop, colon surgery EXAM: PORTABLE CHEST 1 VIEW COMPARISON:  12/09/2017 FINDINGS: Cardiomegaly. Subsegmental atelectasis at the left base. Trace left pleural effusions. Aortic atherosclerosis. No pneumothorax. IMPRESSION: 1. Cardiomegaly with trace left pleural effusion. 2. Coarse pulmonary interstitium, likely chronic change. Subsegmental atelectasis at the left base. Electronically Signed   By: Donavan Foil M.D.   On: 09/05/2018 18:14    Anti-infectives: Anti-infectives (From admission, onward)   Start     Dose/Rate Route Frequency Ordered Stop   09/04/18 1130  cefoTEtan (CEFOTAN) 2 g in sodium chloride 0.9 % 100 mL IVPB     2 g 200 mL/hr over 30 Minutes Intravenous On call to O.R. 09/04/18 1119 09/04/18  1355      Assessment/Plan: Problem List: Patient Active Problem List   Diagnosis Date Noted  . Diverticulitis large intestine w/o perforation or abscess w/o bleeding 09/04/2018  . Colovesical fistula   . Preoperative clearance 08/07/2018  . TIA (transient ischemic attack) 01/06/2018  . Breast cancer ([place]) 09/13/2017  . Obesity, unspecified 09/13/2017  . Warfarin anticoagulation 09/13/2017  . Uterine mass 09/07/2017  . Adnexal mass 08/03/2017  . Pelvic abscess in female 07/07/2017  . Aortic stenosis   . Dyslipidemia, goal LDL below 70 11/28/2016  . Monitoring for long-term anticoagulant use 12/31/2015  . Chronic diastolic CHF (congestive heart failure) ([place]) 10/28/2015  . Insomnia 10/23/2015  . Leg swelling 08/11/2015  . Takotsubo syndrome 07/29/2015  . NSTEMI (non-ST elevated myocardial infarction) ([place]) 07/28/2015  . SOB (shortness of breath) 08/15/2014  . Dilated aortic root ([place])   . Pulmonary HTN ([place])   . Permanent atrial fibrillation 07/04/2013  . Chronic anticoagulation 07/04/2013  . Coronary artery disease   . Lesion of nipple 05/15/2013  . GERD (gastroesophageal reflux disease)   . Hypertension   . COPD (chronic obstructive pulmonary disease) ([place])   . Osteopenia   . Diverticulosis   . Hiatal  hernia   . Abdominal pain 01/11/2012  . Breast cancer of upper-outer quadrant of left female breast ([place]) 01/06/2012  . Colon cancer ([place]) 01/06/2012    Elevated troponin yesterday.  Will repeat today.  Continue liquids 3 Days Post-Op    LOS: 3 days   Matt B. Hassell Done, MD, [facility] [phone] beeper [phone]  09/07/2018 8:21 AM

## 2018-09-07 NOTE — Telephone Encounter (Signed)
Just set her up with Extender ASAP

## 2018-09-07 NOTE — Telephone Encounter (Signed)
Do they want an inpt consult?

## 2018-09-07 NOTE — Telephone Encounter (Signed)
Received call from Dr. Marlou Starks, he is the surgery attending, the patient had a bout of chest pain and had elevated troponins. She will need close follow up, sending to Dr. [name] for reference.

## 2018-09-08 ENCOUNTER — Inpatient Hospital Stay (HOSPITAL_COMMUNITY): Payer: Medicare Other

## 2018-09-08 DIAGNOSIS — I340 Nonrheumatic mitral (valve) insufficiency: Secondary | ICD-10-CM

## 2018-09-08 DIAGNOSIS — I5032 Chronic diastolic (congestive) heart failure: Secondary | ICD-10-CM

## 2018-09-08 LAB — CBC WITH DIFFERENTIAL/PLATELET
Abs Immature Granulocytes: 0.02 10*3/uL (ref 0.00–0.07)
Basophils Absolute: 0 10*3/uL (ref 0.0–0.1)
Basophils Relative: 0 %
EOS PCT: 5 %
Eosinophils Absolute: 0.4 10*3/uL (ref 0.0–0.5)
HCT: 30.4 % — ABNORMAL LOW (ref 36.0–46.0)
Hemoglobin: 9 g/dL — ABNORMAL LOW (ref 12.0–15.0)
Immature Granulocytes: 0 %
LYMPHS PCT: 18 %
Lymphs Abs: 1.2 10*3/uL (ref 0.7–4.0)
MCH: 28.8 pg (ref 26.0–34.0)
MCHC: 29.6 g/dL — ABNORMAL LOW (ref 30.0–36.0)
MCV: 97.1 fL (ref 80.0–100.0)
Monocytes Absolute: 0.8 10*3/uL (ref 0.1–1.0)
Monocytes Relative: 11 %
NEUTROS ABS: 4.6 10*3/uL (ref 1.7–7.7)
Neutrophils Relative %: 66 %
Platelets: 191 10*3/uL (ref 150–400)
RBC: 3.13 MIL/uL — ABNORMAL LOW (ref 3.87–5.11)
RDW: 14.6 % (ref 11.5–15.5)
WBC: 7 10*3/uL (ref 4.0–10.5)
nRBC: 0 % (ref 0.0–0.2)

## 2018-09-08 LAB — ECHOCARDIOGRAM LIMITED
Height: 66 in
Weight: 3010 oz

## 2018-09-08 LAB — BASIC METABOLIC PANEL
Anion gap: 8 (ref 5–15)
BUN: 10 mg/dL (ref 8–23)
CO2: 25 mmol/L (ref 22–32)
CREATININE: 0.67 mg/dL (ref 0.44–1.00)
Calcium: 8.2 mg/dL — ABNORMAL LOW (ref 8.9–10.3)
Chloride: 104 mmol/L (ref 98–111)
GFR calc Af Amer: >60 mL/min (ref >60)
GFR calc non Af Amer: >60 mL/min (ref >60)
Glucose, Bld: 97 mg/dL (ref 70–99)
POTASSIUM: 3.8 mmol/L (ref 3.5–5.1)
Sodium: 137 mmol/L (ref 135–145)

## 2018-09-08 MED ORDER — URELLE 81 MG PO TABS
1.0000 | ORAL_TABLET | Freq: Once | ORAL | Status: AC
  Administered 2018-09-08: 81 mg via ORAL
  Filled 2018-09-08: qty 1

## 2018-09-08 NOTE — Progress Notes (Signed)
SATURATION QUALIFICATIONS: (This note is used to comply with regulatory documentation for home oxygen)  Patient Saturations on Room Air at Rest = 89%  Patient Saturations on Room Air while Ambulating =87%  Patient Saturations on 2 Liters of oxygen while Ambulating = 91%  Sadeen Wiegel, Laurel Dimmer

## 2018-09-08 NOTE — Progress Notes (Signed)
[name] for Lovenox while [name] on hold Indication: atrial fibrillation  Allergies  Allergen Reactions  . Contrast Media [Iodinated Diagnostic Agents] Hives and Other (See Comments)    Per pt strong burning sensation starting in chest radiating outward   . Clonidine Derivatives Other (See Comments)    Throat dry  . Losartan Itching  . Statins Other (See Comments)    "bones hurt"  . Sulfa Antibiotics Diarrhea    Tremors   . Celebrex [Celecoxib] Rash  . Isosorbide Nitrate Itching and Rash  . Other Itching and Rash    Plastic and paper tape and heart monitor pads  . Tape Itching and Rash    Red Where applied and will spread    Patient Measurements: Height: 5\' 6"  (167.6 cm) Weight: 188 lb 2 oz (85.3 kg) IBW/kg (Calculated) : 59.3  Vital Signs: Temp: 98.3 F (36.8 C) (01/03 0428) Temp Source: Oral (01/03 0428) BP: 160/49 (01/03 0428) Pulse Rate: 71 (01/03 0428)  Labs: Recent Labs    09/06/18 0927 09/07/18 0531 09/07/18 0853 09/08/18 0628  HGB  --  9.2*  --  9.0*  HCT  --  31.0*  --  30.4*  PLT  --  180  --  191  CREATININE  --  0.68  --  0.67  TROPONINI 0.10*  --  0.07*  --     Estimated Creatinine Clearance: 60.7 mL/min (by C-G formula based on SCr of 0.67 mg/dL).   Medications:  Scheduled:  . amLODipine  5 mg Oral Daily  . enoxaparin (LOVENOX) injection  1 mg/kg Subcutaneous Q12H  . furosemide  40 mg Intravenous Daily  . losartan  100 mg Oral Daily  . metoprolol succinate  25 mg Oral Daily  . pantoprazole (PROTONIX) IV  40 mg Intravenous QHS  . spironolactone  12.5 mg Oral Daily   Infusions:  . sodium chloride 10 mL/hr at 09/08/18 0300   PRN: clonazePAM, morphine injection, nitroGLYCERIN, ondansetron **OR** ondansetron (ZOFRAN) IV  Assessment: 84 yo female on chronic [name] for afib, admitted 12/30 with colovesicular fistula.  She is s/p sigmoid colectomy with repair of fistula to bladder.  Has been on sq  heparin 5000 unit q8h prophylactic dose post-op (last dose at 14:15); Pharmacy now consulted to begin full dose Lovenox.  Today, 09/08/2018:  CBC: Hgb lower postop as expected but stable, Plt WNL  SCr stable WNL, CrCl ~60 ml/min  No reported bleeding  Goal of Therapy:  Anti-Xa level 0.6-1 units/ml 4hrs after LMWH dose given Monitor platelets by anticoagulation protocol: Yes   Plan:   Continue Lovenox 85mg  SQ q12h (~1mg /kg q12h)  Check CBC at least q72h  Monitor closely for signs/symptoms of bleeding  Advancing diet to full liquids today; f/u for transition back to [place], PharmD, BCPS [phone] 09/08/2018, 8:29 AM

## 2018-09-08 NOTE — Progress Notes (Signed)
Progress Note  Patient Name: [name] Date of Encounter: 09/08/2018  Primary Cardiologist: Fransico Him, MD   Subjective   No chest pain, no SOB only some gas pains.  Inpatient Medications    Scheduled Meds: . amLODipine  5 mg Oral Daily  . enoxaparin (LOVENOX) injection  1 mg/kg Subcutaneous Q12H  . furosemide  40 mg Intravenous Daily  . losartan  100 mg Oral Daily  . metoprolol succinate  25 mg Oral Daily  . pantoprazole (PROTONIX) IV  40 mg Intravenous QHS  . spironolactone  12.5 mg Oral Daily   Continuous Infusions: . sodium chloride 10 mL/hr at 09/08/18 0300   PRN Meds: clonazePAM, morphine injection, nitroGLYCERIN, ondansetron **OR** ondansetron (ZOFRAN) IV   Vital Signs    Vitals:   09/07/18 0619 09/07/18 1314 09/07/18 2104 09/08/18 0428  BP: (!) 162/59 (!) 153/54 (!) 144/54 (!) 160/49  Pulse: 66 61 63 71  Resp: 20  16 18   Temp: 99.2 F (37.3 C) 98.3 F (36.8 C) 98.4 F (36.9 C) 98.3 F (36.8 C)  TempSrc: Oral Oral Oral Oral  SpO2: 98% 99% 99% 99%  Weight:      Height:        Intake/Output Summary (Last 24 hours) at 09/08/2018 1008 Last data filed at 09/08/2018 0436 Gross per 24 hour  Intake 847.34 ml  Output 500 ml  Net 347.34 ml   Filed Weights   09/04/18 1147  Weight: 85.3 kg    Telemetry    A fib rate controlled - Personally Reviewed  ECG    No new - Personally Reviewed  Physical Exam   GEN: No acute distress.   Neck: No JVD Cardiac: irreg irreg, no murmurs, rubs, or gallops.  Respiratory: Clear to auscultation bilaterally. GI: Soft, nontender, non-distended  MS: +  Edema lower ext; No deformity. Neuro:  Nonfocal  Psych: Normal affect   Labs    Chemistry Recent Labs  Lab 09/05/18 0355 09/07/18 0531 09/08/18 0628  NA 137  --  137  K 4.4  --  3.8  CL 102  --  104  CO2 25  --  25  GLUCOSE 152*  --  97  BUN 18  --  10  CREATININE 0.90 0.68 0.67  CALCIUM 8.7*  --  8.2*  GFRNONAA 60* >60 >60  GFRAA >60 >60 >60    ANIONGAP 10  --  8     Hematology Recent Labs  Lab 09/05/18 0355 09/07/18 0531 09/08/18 0628  WBC 12.0* 9.1 7.0  RBC 3.58* 3.24* 3.13*  HGB 10.2* 9.2* 9.0*  HCT 32.8* 31.0* 30.4*  MCV 91.6 95.7 97.1  MCH 28.5 28.4 28.8  MCHC 31.1 29.7* 29.6*  RDW 14.6 14.9 14.6  PLT 265 180 191    Cardiac Enzymes Recent Labs  Lab 09/06/18 0927 09/07/18 0853  TROPONINI 0.10* 0.07*   No results for input(s): TROPIPOC in the last 168 hours.   BNPNo results for input(s): BNP, PROBNP in the last 168 hours.   DDimer  Recent Labs  Lab 09/05/18 1751  DDIMER 3.92*     Radiology    No results found.  Cardiac Studies   Echo 08/21/18  Study Conclusions  - Left ventricle: The cavity size was normal. There was moderate   concentric hypertrophy. Systolic function was normal. The   estimated ejection fraction was in the range of 60% to 65%. Wall   motion was normal; there were no regional wall motion   abnormalities.  Features are consistent with a pseudonormal left   ventricular filling pattern, with concomitant abnormal relaxation   and increased filling pressure (grade 2 diastolic dysfunction). - Aortic valve: Trileaflet; moderately thickened, severely   calcified leaflets. There was mild stenosis. There was no   regurgitation. Mean gradient (S): 11 mm Hg. Peak gradient (S): 22   mm Hg. - Ascending aorta: The ascending aorta was normal in size measuring   39 mm. - Mitral valve: Calcified annulus. Mildly thickened leaflets .   There was mild regurgitation. - Left atrium: The atrium was moderately dilated. - Right atrium: The atrium was moderately dilated. - Tricuspid valve: There was moderate regurgitation. - Pulmonary arteries: Systolic pressure was moderately increased.   PA peak pressure: 53 mm Hg (S). - Inferior vena cava: The vessel was dilated. The respirophasic   diameter changes were blunted (< 50%), consistent with elevated   central venous pressure. - Pericardium,  extracardiac: There was no pericardial effusion.  Limited Echo pending  Patient Profile     84 y.o. female with a hx of Takotsubo CM 2016, with recovered EF, nonobstructive CAD, HTN, chronic diastolic heart failure, pulmonary hypertension, permanent atrial fibrillation who is being seen today for the evaluation of postoperative elevated troponins at the request of Dr. Marlou Starks.   Assessment & Plan    Chest pain, elevated troponins postoperatively: Quality and location of chest pain atypical, but she did have a small elevation in her troponin level. History of prior nonobstructive CAD. Recent echo and stress test normal. Consider demand post op vs. Minor NSTEMI -she is now asymptomatic. Will order limited repeat echo for wall motion only. If normal, still pending.  will defer further workup with possible stress test to outpatient. -on therapeutic lovenox for anticoagulation. If ok from post op surgical standpo -has adverse reactions to statins, note that "bones hurt."  -restarted home metoprolol  Hypertension: mildly elevated here. Restarted home metoprolol today, back on her home amlodipine and losartan and toprol. Not yet back on her home clonidine.Not on home diuretics yet.  -will restart spironolactone yesterday -BP 160/49 today   Chronic diastolic heart failure: slightly volume up today. Is on torsemide and spironolactone at home. Recommend 60 IV lasix daily and then restarted PO prior to discharge. -today she is +[phone] Lasix is ordered but did not receive any yesterday.    Permanent atrial fibrillation: rate controlled, on anticoagulation with lovenox now and apixaban 5 mg BID. CHA2DS2/VAS Stroke Risk Points= 8       For questions or updates, please contact [facility] Please consult [email or web address] for contact info under        Signed, Cecilie Kicks, NP  09/08/2018, 10:08 AM

## 2018-09-08 NOTE — Progress Notes (Signed)
Paged on call provider, Lamar Blinks, NP to notify that urine is getting darker red with clots and pt is c/o severe pain to lower abdomen. Awaiting orders/call back. Morphine 4 mg IV administered. Will continue to monitor pt closely.

## 2018-09-08 NOTE — Progress Notes (Signed)
4 Days Post-Op   Subjective/Chief Complaint: Breathing seems better. Denies chest pain   Objective: Vital signs in last 24 hours: Temp:  [98.3 F (36.8 C)-99.2 F (37.3 C)] 98.4 F (36.9 C) (01/02 2104) Pulse Rate:  [61-66] 63 (01/02 2104) Resp:  [16-20] 16 (01/02 2104) BP: (144-162)/(54-59) 144/54 (01/02 2104) SpO2:  [98 %-99 %] 99 % (01/02 2104) Last BM Date: 09/05/18  Intake/Output from previous day: 01/02 0701 - 01/03 0700 In: 1043.7 [I.V.:993.7] Out: -  Intake/Output this shift: Total I/O In: 647.4 [I.V.:597.4; Other:50] Out: -   General appearance: alert and cooperative Resp: clear to auscultation bilaterally Cardio: regular rate and rhythm GI: soft, mild tenderness. incision looks good. few bs  Lab Results:  Recent Labs    09/05/18 0355 09/07/18 0531  WBC 12.0* 9.1  HGB 10.2* 9.2*  HCT 32.8* 31.0*  PLT 265 180   BMET Recent Labs    09/05/18 0355 09/07/18 0531  NA 137  --   K 4.4  --   CL 102  --   CO2 25  --   GLUCOSE 152*  --   BUN 18  --   CREATININE 0.90 0.68  CALCIUM 8.7*  --    PT/INR No results for input(s): LABPROT, INR in the last 72 hours. ABG No results for input(s): PHART, HCO3 in the last 72 hours.  Invalid input(s): PCO2, PO2  Studies/Results: No results found.  Anti-infectives: Anti-infectives (From admission, onward)   Start     Dose/Rate Route Frequency Ordered Stop   09/04/18 1130  cefoTEtan (CEFOTAN) 2 g in sodium chloride 0.9 % 100 mL IVPB     2 g 200 mL/hr over 30 Minutes Intravenous On call to O.R. 09/04/18 1119 09/04/18 1355      Assessment/Plan: s/p Procedure(s): LAPAROSCOPIC ASSISTED SIGMOID COLECTOMY WITH REPAIR OF FISTULA TO BLADDER (N/A) CYSTOSCOPY WITH LEFT STENT PLACEMENT, BLADDER REPAIR, CYSTOSCOPY WITH LEFT STENT REMOVAL (Left) Advance diet. Start fulls today [facility] input. Will restart the rest of her cardiac meds and diuretic Ambulate with assistance. Leave foley in for now  LOS: 4  days    [name] 09/08/2018

## 2018-09-08 NOTE — Plan of Care (Signed)
  Problem: Education: Goal: Knowledge of General Education information will improve Description Including pain rating scale, medication(s)/side effects and non-pharmacologic comfort measures Outcome: Progressing   

## 2018-09-08 NOTE — Progress Notes (Signed)
  Echocardiogram 2D Echocardiogram limited has been performed.  Jennette Dubin 09/08/2018, 1:46 PM

## 2018-09-09 MED ORDER — CLONIDINE HCL 0.1 MG PO TABS
0.1000 mg | ORAL_TABLET | Freq: Two times a day (BID) | ORAL | Status: DC
  Administered 2018-09-09 – 2018-09-11 (×5): 0.1 mg via ORAL
  Filled 2018-09-09 (×5): qty 1

## 2018-09-09 MED ORDER — FUROSEMIDE 10 MG/ML IJ SOLN
60.0000 mg | Freq: Every day | INTRAMUSCULAR | Status: DC
  Administered 2018-09-09 – 2018-09-11 (×3): 60 mg via INTRAVENOUS
  Filled 2018-09-09 (×3): qty 6

## 2018-09-09 MED ORDER — APIXABAN 5 MG PO TABS
5.0000 mg | ORAL_TABLET | Freq: Two times a day (BID) | ORAL | Status: DC
  Administered 2018-09-09 – 2018-09-11 (×5): 5 mg via ORAL
  Filled 2018-09-09 (×5): qty 1

## 2018-09-09 NOTE — Progress Notes (Signed)
Progress Note  Patient Name: [name] Date of Encounter: 09/09/2018  Primary Cardiologist: Fransico Him, MD   Subjective   Feeling fatigued. Not much appetite. Passing flatus, no BM since prior two days ago. Still with intermittent gas pain but no chest discomfort.  Inpatient Medications    Scheduled Meds: . amLODipine  5 mg Oral Daily  . enoxaparin (LOVENOX) injection  1 mg/kg Subcutaneous Q12H  . furosemide  40 mg Intravenous Daily  . losartan  100 mg Oral Daily  . metoprolol succinate  25 mg Oral Daily  . pantoprazole (PROTONIX) IV  40 mg Intravenous QHS  . spironolactone  12.5 mg Oral Daily   Continuous Infusions: . sodium chloride 10 mL/hr at 09/08/18 0300   PRN Meds: clonazePAM, morphine injection, nitroGLYCERIN, ondansetron **OR** ondansetron (ZOFRAN) IV   Vital Signs    Vitals:   09/08/18 0428 09/08/18 1454 09/08/18 2123 09/09/18 0617  BP: (!) 160/49 (!) 151/58 (!) 148/50 (!) 149/89  Pulse: 71 67 (!) 55 (!) 57  Resp: 18 16 16 14   Temp: 98.3 F (36.8 C) 99 F (37.2 C) 98.5 F (36.9 C) 98.6 F (37 C)  TempSrc: Oral Oral Oral Oral  SpO2: 99% 98% 99% 99%  Weight:      Height:        Intake/Output Summary (Last 24 hours) at 09/09/2018 1041 Last data filed at 09/09/2018 0230 Gross per 24 hour  Intake 439.97 ml  Output 1425 ml  Net -985.03 ml   Filed Weights   09/04/18 1147  Weight: 85.3 kg    Telemetry    Afib, rate controlled with rare PVCs - Personally Reviewed  ECG    No new since 12/31- Personally Reviewed  Physical Exam   GEN: No acute distress.   Neck: No JVD Cardiac: irreg irreg, no murmurs, rubs, or gallops.  Respiratory: Clear to auscultation bilaterally. GI: Soft, nontender, non-distended  MS: + mild Edema lower ext; No deformity. Neuro:  Nonfocal  Psych: Normal affect   Labs    Chemistry Recent Labs  Lab 09/05/18 0355 09/07/18 0531 09/08/18 0628  NA 137  --  137  K 4.4  --  3.8  CL 102  --  104  CO2 25  --  25    GLUCOSE 152*  --  97  BUN 18  --  10  CREATININE 0.90 0.68 0.67  CALCIUM 8.7*  --  8.2*  GFRNONAA 60* >60 >60  GFRAA >60 >60 >60  ANIONGAP 10  --  8     Hematology Recent Labs  Lab 09/05/18 0355 09/07/18 0531 09/08/18 0628  WBC 12.0* 9.1 7.0  RBC 3.58* 3.24* 3.13*  HGB 10.2* 9.2* 9.0*  HCT 32.8* 31.0* 30.4*  MCV 91.6 95.7 97.1  MCH 28.5 28.4 28.8  MCHC 31.1 29.7* 29.6*  RDW 14.6 14.9 14.6  PLT 265 180 191    Cardiac Enzymes Recent Labs  Lab 09/06/18 0927 09/07/18 0853  TROPONINI 0.10* 0.07*   No results for input(s): TROPIPOC in the last 168 hours.   BNPNo results for input(s): BNP, PROBNP in the last 168 hours.   DDimer  Recent Labs  Lab 09/05/18 1751  DDIMER 3.92*     Radiology    No results found.  Cardiac Studies   Echo 08/21/18  Study Conclusions  - Left ventricle: The cavity size was normal. There was moderate   concentric hypertrophy. Systolic function was normal. The   estimated ejection fraction was in the range  of 60% to 65%. Wall   motion was normal; there were no regional wall motion   abnormalities. Features are consistent with a pseudonormal left   ventricular filling pattern, with concomitant abnormal relaxation   and increased filling pressure (grade 2 diastolic dysfunction). - Aortic valve: Trileaflet; moderately thickened, severely   calcified leaflets. There was mild stenosis. There was no   regurgitation. Mean gradient (S): 11 mm Hg. Peak gradient (S): 22   mm Hg. - Ascending aorta: The ascending aorta was normal in size measuring   39 mm. - Mitral valve: Calcified annulus. Mildly thickened leaflets .   There was mild regurgitation. - Left atrium: The atrium was moderately dilated. - Right atrium: The atrium was moderately dilated. - Tricuspid valve: There was moderate regurgitation. - Pulmonary arteries: Systolic pressure was moderately increased.   PA peak pressure: 53 mm Hg (S). - Inferior vena cava: The vessel was  dilated. The respirophasic   diameter changes were blunted (< 50%), consistent with elevated   central venous pressure. - Pericardium, extracardiac: There was no pericardial effusion.  Limited Echo 09/08/2018 - Left ventricle: The cavity size was normal. Systolic function was   normal. The estimated ejection fraction was in the range of 60%   to 65%. Wall motion was normal; there were no regional wall   motion abnormalities. The study is not technically sufficient to   allow evaluation of LV diastolic function. - Aortic valve: Trileaflet; moderately thickened, moderately   calcified leaflets. - Mitral valve: Calcified annulus. There was mild regurgitation. - Left atrium: The atrium was mildly dilated. - Right ventricle: The cavity size was mildly dilated. Wall   thickness was normal. - Right atrium: The atrium was moderately dilated. - Tricuspid valve: There was mild regurgitation. - Pulmonary arteries: Systolic pressure was moderately increased.   PA peak pressure: 62 mm Hg (S).  Patient Profile     84 y.o. female with a hx of Takotsubo CM 2016, with recovered EF, nonobstructive CAD, HTN, chronic diastolic heart failure, pulmonary hypertension, permanent atrial fibrillation who is being followed for the evaluation of postoperative elevated troponins at the request of Dr. Marlou Starks.   Assessment & Plan    Chest pain, elevated troponins postoperatively: Quality and location of chest pain atypical, but she did have a small elevation in her troponin level. History of prior nonobstructive CAD. Recent echo and stress test normal. -limited echo with normal EF and no wall motion abnormalities. No recurrent pain. This [place] be post op demand given minimal elevation in troponin. -consider further workup with possible stress test as outpatient. -has adverse reactions to statins, note that "bones hurt."  -restarted home metoprolol  Hypertension: continues to be elevated here.  -on home amlodipine  5 mg daily. Consider increase to 10 mg daily if remains elevated with restart of clonidine. -on home metoprolol succinate 25 mg daily -on home losartan 100 mg daily -on home spironolactone 12.5 mg daily -on IV lasix, continue while not taking good PO, restart home torsemide 60 mg daily at discharge -was on clonidine at home, will restart today. Listed as side effect of "throat dry" but was tolerating at home.  Chronic diastolic heart failure: Still net positive since admission, but about 1 L negative so far today.  -continue 60 IV lasix daily and then restarted PO prior to discharge. -on home spironolactone  Permanent atrial fibrillation: rate controlled, CHA2DS2/VAS Stroke Risk Points= 8  -when appropriate from surgical standpoint, would change from lovenox back to her home  apixaban 5 mg BID.    [facility] will sign off.   Medication Recommendations:  As noted, plan to be back on her home medications at discharge Other recommendations (labs, testing, etc):  Will discuss need for outpatient stress test when she sees Dr. [name] Follow up as an outpatient:  Has appointment with Dr. [name] on 09/28/18  For questions or updates, please contact [facility] Please consult [email or web address] for contact info under    TIME SPENT WITH PATIENT: 25 minutes of direct patient care. More than 50% of that time was spent on coordination of care and counseling regarding discharge planning and medication discussion.  Buford Dresser, MD, PhD [facility]     Signed, Buford Dresser, MD  09/09/2018, 10:41 AM

## 2018-09-09 NOTE — Progress Notes (Signed)
[place] for Lovenox ==> resuming PTA Eliquis Indication: atrial fibrillation  Allergies  Allergen Reactions  . Contrast Media [Iodinated Diagnostic Agents] Hives and Other (See Comments)    Per pt strong burning sensation starting in chest radiating outward   . Clonidine Derivatives Other (See Comments)    Throat dry  . Losartan Itching  . Statins Other (See Comments)    "bones hurt"  . Sulfa Antibiotics Diarrhea    Tremors   . Celebrex [Celecoxib] Rash  . Isosorbide Nitrate Itching and Rash  . Other Itching and Rash    Plastic and paper tape and heart monitor pads  . Tape Itching and Rash    Red Where applied and will spread    Patient Measurements: Height: 5\' 6"  (167.6 cm) Weight: 188 lb 2 oz (85.3 kg) IBW/kg (Calculated) : 59.3  Vital Signs: Temp: 98.6 F (37 C) (01/04 0617) Temp Source: Oral (01/04 0617) BP: 149/89 (01/04 0617) Pulse Rate: 57 (01/04 0617)  Labs: Recent Labs    09/07/18 0531 09/07/18 0853 09/08/18 0628  HGB 9.2*  --  9.0*  HCT 31.0*  --  30.4*  PLT 180  --  191  CREATININE 0.68  --  0.67  TROPONINI  --  0.07*  --     Estimated Creatinine Clearance: 60.7 mL/min (by C-G formula based on SCr of 0.67 mg/dL).   Medications:  Scheduled:  . amLODipine  5 mg Oral Daily  . apixaban  5 mg Oral BID  . cloNIDine  0.1 mg Oral BID  . [START ON 09/10/2018] furosemide  60 mg Intravenous Daily  . losartan  100 mg Oral Daily  . metoprolol succinate  25 mg Oral Daily  . pantoprazole (PROTONIX) IV  40 mg Intravenous QHS  . spironolactone  12.5 mg Oral Daily   Infusions:  . 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  She is s/p sigmoid colectomy with repair of fistula to bladder.  Has been on sq heparin 5000 unit q8h  prophylactic dose post-op (last dose at 14:15); Pharmacy now consulted to begin full dose Lovenox.  Today, 09/09/2018:  CBC: Hgb lower postop as expected but stable, Plt WNL  SCr stable WNL, CrCl ~60 ml/min  No reported bleeding  Surgery has consulted pharmacy to resume patient's home Eliquis dose  Goal of Therapy:  Anti-Xa level 0.6-1 units/ml 4hrs after LMWH dose given Monitor platelets by anticoagulation protocol: Yes   Plan:   D/C Lovenox (last dose charted on 09/08/2018 @ 21:30)  Restart Eliquis 5mg  PO BID   Will sign off at this time.  Please reconsult if a change in clinical status warrants re-evaluation of dosage.   Thank you, Leone Haven, PharmD 09/09/2018, 11:02 AM

## 2018-09-09 NOTE — Progress Notes (Signed)
5 Days Post-Op   Subjective/Chief Complaint: Still weak and not taking in a lot po  Is passing flatus No SOB   Objective: Vital signs in last 24 hours: Temp:  [98.5 F (36.9 C)-99 F (37.2 C)] 98.6 F (37 C) (01/04 0617) Pulse Rate:  [55-67] 57 (01/04 0617) Resp:  [14-16] 14 (01/04 0617) BP: (148-151)/(50-89) 149/89 (01/04 0617) SpO2:  [98 %-99 %] 99 % (01/04 0617) Last BM Date: 09/06/18  Intake/Output from previous day: 01/03 0701 - 01/04 0700 In: 470 [P.O.:360; I.V.:110] Out: 1425 [Urine:1425] Intake/Output this shift: No intake/output data recorded.  Exam: Awake and alert Abdomen soft, minimally tender Lungs clear  Lab Results:  Recent Labs    09/07/18 0531 09/08/18 0628  WBC 9.1 7.0  HGB 9.2* 9.0*  HCT 31.0* 30.4*  PLT 180 191   BMET Recent Labs    09/07/18 0531 09/08/18 0628  NA  --  137  K  --  3.8  CL  --  104  CO2  --  25  GLUCOSE  --  97  BUN  --  10  CREATININE 0.68 0.67  CALCIUM  --  8.2*   PT/INR No results for input(s): LABPROT, INR in the last 72 hours. ABG No results for input(s): PHART, HCO3 in the last 72 hours.  Invalid input(s): PCO2, PO2  Studies/Results: No results found.  Anti-infectives: Anti-infectives (From admission, onward)   Start     Dose/Rate Route Frequency Ordered Stop   09/04/18 1130  cefoTEtan (CEFOTAN) 2 g in sodium chloride 0.9 % 100 mL IVPB     2 g 200 mL/hr over 30 Minutes Intravenous On call to O.R. 09/04/18 1119 09/04/18 1355      Assessment/Plan: s/p Procedure(s): LAPAROSCOPIC ASSISTED SIGMOID COLECTOMY WITH REPAIR OF FISTULA TO BLADDER (N/A) CYSTOSCOPY WITH LEFT STENT PLACEMENT, BLADDER REPAIR, CYSTOSCOPY WITH LEFT STENT REMOVAL (Left)  Advance diet Appreciate cardiology's care   LOS: 5 days    Coralie Keens 09/09/2018

## 2018-09-10 MED ORDER — HYDROCODONE-ACETAMINOPHEN 5-325 MG PO TABS
1.0000 | ORAL_TABLET | ORAL | Status: DC | PRN
  Administered 2018-09-10: 1 via ORAL
  Filled 2018-09-10: qty 1

## 2018-09-10 NOTE — Progress Notes (Signed)
Pt ambulated around 90 feet in hallway with RW and minimal assist. Tolerated well. O2 Sats remained 91-94% RA during walk. Pt sitting up in chair at this time. Also has walked back & forth to BR several times and has had 3 small BMs this shift so far. Will continue to monitor.

## 2018-09-10 NOTE — Progress Notes (Signed)
6 Days Post-Op   Subjective/Chief Complaint: Pt states she is drinking liquids without nausea, passing flatus and has had a small BM.  She states she has not ambulated because she couldn't get someone to help her.     Objective: Vital signs in last 24 hours: Temp:  [98 F (36.7 C)-98.3 F (36.8 C)] 98.3 F (36.8 C) (01/05 0444) Pulse Rate:  [57-66] 61 (01/05 0444) Resp:  [16-18] 18 (01/05 0444) BP: (109-134)/(45-52) 134/52 (01/05 0444) SpO2:  [91 %-99 %] 92 % (01/05 0444) Last BM Date: 09/06/18  Intake/Output from previous day: 01/04 0701 - 01/05 0700 In: 501.9 [P.O.:210; I.V.:291.9] Out: 1050 [Urine:1050] Intake/Output this shift: No intake/output data recorded.  Exam: Awake and alert Abdomen soft, minimally tender Inc: C/D/I Lungs clear  Lab Results:  Recent Labs    09/08/18 0628  WBC 7.0  HGB 9.0*  HCT 30.4*  PLT 191   BMET Recent Labs    09/08/18 0628  NA 137  K 3.8  CL 104  CO2 25  GLUCOSE 97  BUN 10  CREATININE 0.67  CALCIUM 8.2*   PT/INR No results for input(s): LABPROT, INR in the last 72 hours. ABG No results for input(s): PHART, HCO3 in the last 72 hours.  Invalid input(s): PCO2, PO2  Studies/Results: No results found.  Anti-infectives: Anti-infectives (From admission, onward)   Start     Dose/Rate Route Frequency Ordered Stop   09/04/18 1130  cefoTEtan (CEFOTAN) 2 g in sodium chloride 0.9 % 100 mL IVPB     2 g 200 mL/hr over 30 Minutes Intravenous On call to O.R. 09/04/18 1119 09/04/18 1355      Assessment/Plan: s/p Procedure(s): LAPAROSCOPIC ASSISTED SIGMOID COLECTOMY WITH REPAIR OF FISTULA TO BLADDER (N/A) CYSTOSCOPY WITH LEFT STENT PLACEMENT, BLADDER REPAIR, CYSTOSCOPY WITH LEFT STENT REMOVAL (Left)  Advance diet Appreciate cardiology's care PO pain meds Ambulate   LOS: 6 days    [name] 09/11/1094

## 2018-09-11 MED ORDER — PANTOPRAZOLE SODIUM 40 MG PO TBEC: 40.0000 mg | DELAYED_RELEASE_TABLET | Freq: Every day | ORAL | Status: DC

## 2018-09-11 MED ORDER — HYDROCODONE-ACETAMINOPHEN 5-325 MG PO TABS: 1.0000 | ORAL_TABLET | Freq: Four times a day (QID) | ORAL | 0 refills | Status: DC | PRN

## 2018-09-11 MED ORDER — ENSURE MAX PROTEIN PO LIQD
11.0000 [oz_av] | Freq: Every day | ORAL | Status: DC
  Filled 2018-09-11: qty 330

## 2018-09-11 NOTE — Progress Notes (Signed)
Nutrition Follow-up  DOCUMENTATION CODES:   Obesity unspecified  INTERVENTION:  - Continue Ensure Max once/day. - Continue to encourage PO intakes.    NUTRITION DIAGNOSIS:   Increased nutrient needs related to post-op healing as evidenced by estimated needs. -ongoing  GOAL:   Patient will meet greater than or equal to 90% of their needs -beginning to meet  MONITOR:   PO intake, Supplement acceptance, Weight trends, Labs  ASSESSMENT:   84 y.o. patient with history of sigmoid colon diverticulitis.   No new weight since admission. She is POD #7 lap assisted sigmoid colectomy, rigid sigmoidoscopy, bladder repair, cystoscopy with L stent removal and replacement. Diet advanced from NPO to CLD on 1/1 and from CLD to [place] on 1/4. On 1/4 she consumed 100% of meals without issue and was subsequently advanced to Soft diet on 1/5 at 8:00 AM. She has been eating 50% of meals while on CLD.   She denies abdominal pain or nausea. Ensure Max was ordered once/day and is scheduled for her to receive the first one today at 3:00 PM.    Medications reviewed; 60 mg IV lasix/day, 12.5 mg aldactone/day. Labs reviewed; Ca: 8.2 mg/dL.    Diet Order:   Diet Order            DIET SOFT Room service appropriate? Yes; Fluid consistency: Thin  Diet effective now              EDUCATION NEEDS:   No education needs have been identified at this time  Skin:  Skin Assessment: Reviewed RN Assessment  Last BM:  1/6  Height:   Ht Readings from Last 1 Encounters:  09/04/18 5\' 6"  (1.676 m)    Weight:   Wt Readings from Last 1 Encounters:  09/04/18 85.3 kg    Ideal Body Weight:  59.1 kg  BMI:  Body mass index is 30.36 kg/m.  Estimated Nutritional Needs:   Kcal:  1500-1700  Protein:  70-80g  Fluid:  1.7L/day     Jarome Matin, MS, RD, LDN, CNSC Inpatient Clinical Dietitian Pager # [phone] After hours/weekend pager # [phone]

## 2018-09-11 NOTE — Care Management Note (Signed)
Case Management Note  Patient Details  Name: Clarence Dunsmore Elamin MRN: [id number] Date of Birth: 1934/11/23  Subjective/Objective: Received call from nurse that dr is discharging patient now. From home w/family support. [facility] for [facility] HHRN/PT/aide ordered-[facility] rep Santiago Glad aware of d/c & HHC orders. No further CM needs.                   Action/Plan:dc home w/HHC.   Expected Discharge Date:  09/11/18               Expected Discharge Plan:  [place]  In-House Referral:     Discharge planning Services  CM Consult  Post Acute Care Choice:  (has rw from spouse) Choice offered to:  Patient  DME Arranged:    DME Agency:     HH Arranged:  RN, PT, Nurse's Aide [place] Agency:  [place]  Status of Service:  Completed, signed off  If discussed at [place] of Stay Meetings, dates discussed:    Additional Comments:  Dessa Phi, RN 09/11/2018, 4:45 PM

## 2018-09-11 NOTE — Progress Notes (Signed)
Patient ID: [name], female   DOB: 1934-12-15, 84 y.o.   MRN: [id number]  Pt noted to still be here today.  If patient is discharged home today, will keep plan to perform outpatient cystogram on Wednesday in our office as scheduled.  If she remains hospitalized, will consider cystogram in the hospital tomorrow with possible catheter removal before discharge.

## 2018-09-11 NOTE — Progress Notes (Signed)
7 Days Post-Op   Subjective/Chief Complaint: No complaints   Objective: Vital signs in last 24 hours: Temp:  [97.8 F (36.6 C)-98.6 F (37 C)] 98.6 F (37 C) (01/06 1553) Pulse Rate:  [51-59] 58 (01/06 1553) Resp:  [18] 18 (01/06 0418) BP: (137-153)/(44-65) 153/48 (01/06 1553) SpO2:  [92 %-95 %] 95 % (01/06 1553) Last BM Date: 09/11/18  Intake/Output from previous day: 01/05 0701 - 01/06 0700 In: 1080 [P.O.:1080] Out: 1150 [Urine:1150] Intake/Output this shift: Total I/O In: 240 [P.O.:240] Out: 950 [Urine:950]  General appearance: alert and cooperative Resp: clear to auscultation bilaterally Cardio: regular rate and rhythm GI: soft, nontender. good bs. incision looks good  Lab Results:  No results for input(s): WBC, HGB, HCT, PLT in the last 72 hours. BMET No results for input(s): NA, K, CL, CO2, GLUCOSE, BUN, CREATININE, CALCIUM in the last 72 hours. PT/INR No results for input(s): LABPROT, INR in the last 72 hours. ABG No results for input(s): PHART, HCO3 in the last 72 hours.  Invalid input(s): PCO2, PO2  Studies/Results: No results found.  Anti-infectives: Anti-infectives (From admission, onward)   Start     Dose/Rate Route Frequency Ordered Stop   09/04/18 1130  cefoTEtan (CEFOTAN) 2 g in sodium chloride 0.9 % 100 mL IVPB     2 g 200 mL/hr over 30 Minutes Intravenous On call to O.R. 09/04/18 1119 09/04/18 1355      Assessment/Plan: s/p Procedure(s): LAPAROSCOPIC ASSISTED SIGMOID COLECTOMY WITH REPAIR OF FISTULA TO BLADDER (N/A) CYSTOSCOPY WITH LEFT STENT PLACEMENT, BLADDER REPAIR, CYSTOSCOPY WITH LEFT STENT REMOVAL (Left) Advance diet Discharge  Will ask home health to check on her  LOS: 7 days    [name] III 09/11/2018

## 2018-09-11 NOTE — Progress Notes (Signed)
PHARMACIST - PHYSICIAN COMMUNICATION  DR:   Marlou Starks  CONCERNING: IV to Oral Route Change Policy  RECOMMENDATION: This patient is receiving Protonix by the intravenous route.  Based on criteria approved by the Pharmacy and Therapeutics Committee, the intravenous medication(s) is/are being converted to the equivalent oral dose form(s).   DESCRIPTION: These criteria include:  The patient is eating (either orally or via tube) and/or has been taking other orally administered medications for a least 24 hours  The patient has no evidence of active gastrointestinal bleeding or impaired GI absorption (gastrectomy, short bowel, patient on TNA or NPO).  If you have questions about this conversion, please contact the Pharmacy Department  []   [phone] )  [facility] []   [phone] )  [facility] []   [phone] )  [facility] []   [phone] )  [facility] [x]   [phone] )  [place], [place], [facility] 09/11/2018 10:47 AM

## 2018-09-11 NOTE — Care Management Important Message (Signed)
Important Message  Patient Details  Name: [name] MRN: [id number] Date of Birth: May 28, 1935   Medicare Important Message Given:  Yes    Kerin Salen 09/11/2018, 2:12 [place] Message  Patient Details  Name: [name] MRN: [id number] Date of Birth: [birth year]   Medicare Important Message Given:  Yes    Kerin Salen 09/11/2018, 2:12 PM

## 2018-09-13 DIAGNOSIS — N321 Vesicointestinal fistula: Secondary | ICD-10-CM | POA: Diagnosis not present

## 2018-09-14 NOTE — Discharge Summary (Signed)
Physician Discharge Summary  Patient ID: [name] MRN: [id number] DOB/AGE: 82-Jan-1936 82 y.o.  Admit date: 09/04/2018 Discharge date: 09/14/2018  Admission Diagnoses:  Discharge Diagnoses:  Active Problems:   Diverticulitis large intestine w/o perforation or abscess w/o bleeding   Discharged Condition: good  Hospital Course: the pt underwent sigmoid colectomy and repair colovesicle fistula. She tolerated surgery well. She had some chest discomfort and SOB on pod 2. She was found to have a slightly elevated troponin. Cards was consulted. She gradually improved and diet slowly advanced. After about a week she was ready for discharge home. The foley remainded in and she has follow up with urology  Consults: cardiology  Significant Diagnostic Studies: none  Treatments: surgery: as above  Discharge Exam: Blood pressure (!) 153/48, pulse (!) 58, temperature 98.6 F (37 C), temperature source Oral, resp. rate 18, height 5\' 6"  (1.676 m), weight 85.3 kg, SpO2 95 %. General appearance: alert and cooperative Resp: clear to auscultation bilaterally Cardio: regular rate and rhythm GI: soft, minimal tenderness. incision looks good. good bs  Disposition:   Discharge Instructions    Call MD for:  difficulty breathing, headache or visual disturbances   Complete by:  As directed    Call MD for:  extreme fatigue   Complete by:  As directed    Call MD for:  hives   Complete by:  As directed    Call MD for:  persistant dizziness or light-headedness   Complete by:  As directed    Call MD for:  persistant nausea and vomiting   Complete by:  As directed    Call MD for:  redness, tenderness, or signs of infection (pain, swelling, redness, odor or green/yellow discharge around incision site)   Complete by:  As directed    Call MD for:  severe uncontrolled pain   Complete by:  As directed    Call MD for:  temperature >100.4   Complete by:  As directed    Diet - low sodium heart healthy    Complete by:  As directed    Discharge instructions   Complete by:  As directed    Diet as tolerated. No heavy lifting. Empty foley as needed. No heavy lifting   Increase activity slowly   Complete by:  As directed    No wound care   Complete by:  As directed      Allergies as of 09/11/2018      Reactions   Contrast Media [iodinated Diagnostic Agents] Hives, Other (See Comments)   Per pt strong burning sensation starting in chest radiating outward   Clonidine Derivatives Other (See Comments)   Throat dry   Losartan Itching   Statins Other (See Comments)   "bones hurt"   Sulfa Antibiotics Diarrhea   Tremors    Celebrex [celecoxib] Rash   Isosorbide Nitrate Itching, Rash   Other Itching, Rash   Plastic and paper tape and heart monitor pads   Tape Itching, Rash   Red Where applied and will spread      Medication List    TAKE these medications   acetaminophen 500 MG tablet Commonly known as:  TYLENOL Take 500-1,000 mg by mouth every 6 (six) hours as needed for headache (pain).   amLODipine 5 MG tablet Commonly known as:  NORVASC TAKE 1 TABLET BY MOUTH ONCE DAILY   amoxicillin-clavulanate 500-125 MG tablet Commonly known as:  AUGMENTIN Take 1 tablet by mouth 2 (two) times daily.   apixaban 5 MG  Tabs tablet Commonly known as:  ELIQUIS Take 1 tablet (5 mg total) by mouth 2 (two) times daily.   augmented betamethasone dipropionate 0.05 % ointment Commonly known as:  DIPROLENE-AF Apply 1 application topically 2 (two) times daily as needed (for skin irritation/rashes.).   clonazePAM 0.5 MG tablet Commonly known as:  KLONOPIN TAKE 1 TABLET BY MOUTH TWICE DAILY AS NEEDED FOR ANXIETY What changed:    reasons to take this  additional instructions   cloNIDine 0.1 MG tablet Commonly known as:  CATAPRES Take 1 tablet (0.1 mg total) by mouth 2 (two) times daily.   HYDROcodone-acetaminophen 5-325 MG tablet Commonly known as:  NORCO/VICODIN Take 1-2 tablets by mouth  every 6 (six) hours as needed for moderate pain or severe pain.   losartan 100 MG tablet Commonly known as:  COZAAR TAKE 1 TABLET BY MOUTH ONCE DAILY   LUBRICANT EYE DROPS 0.4-0.3 % Soln Generic drug:  Polyethyl Glycol-Propyl Glycol Place 1 drop into both eyes 3 (three) times daily as needed (for dry/irritated eyes.).   metoprolol succinate 25 MG 24 hr tablet Commonly known as:  TOPROL XL Take 1 tablet (25 mg total) by mouth daily.   metroNIDAZOLE 500 MG tablet Commonly known as:  FLAGYL Take 1,000 mg by mouth See admin instructions. Take 2 tablets (1000 mg) by mouth at 2 pm, 3 pm, & 10 pm the day prior to your colon operation   mirtazapine 15 MG tablet Commonly known as:  REMERON TAKE 1 TABLET BY MOUTH AT BEDTIME   MUCINEX MAXIMUM STRENGTH 1200 MG Tb12 Generic drug:  Guaifenesin Take 1,200 mg by mouth daily as needed (congestion).   neomycin 500 MG tablet Commonly known as:  MYCIFRADIN Take 1,000 mg by mouth See admin instructions. Take 2 tablets (1000 mg) by mouth at 2 pm, 3 pm, & 10 pm the day prior to your colon operation   nitroGLYCERIN 0.4 MG SL tablet Commonly known as:  NITROSTAT PLACE ONE TABLET UNDER THE TONGUE EVERY FIVE MINUTES AS NEEDED FOR CHEST PAIN. [place] REPEAT FOR THREE DOSES. What changed:    how much to take  how to take this  when to take this  reasons to take this  additional instructions   omeprazole 20 MG tablet Commonly known as:  PRILOSEC OTC Take 20 mg by mouth daily as needed (heartburn/indigestion.).   ondansetron 4 MG disintegrating tablet Commonly known as:  ZOFRAN ODT Take 1 tablet (4 mg total) by mouth every 8 (eight) hours as needed for nausea or vomiting.   spironolactone 25 MG tablet Commonly known as:  ALDACTONE TAKE 1/2 (ONE-HALF) TABLET BY MOUTH ONCE DAILY What changed:  See the new instructions.   torsemide 20 MG tablet Commonly known as:  DEMADEX Take 3 tablets (60 mg total) by mouth daily.      Follow-up  Information    Raynelle Bring, MD Follow up.   Specialty:  Urology Why:  09/13/18 at 11:15 AM Contact information: [place] [address] [phone]        [name], MD Follow up on 09/28/2018.   Specialty:  Cardiology Why:  at 11:00 AM Contact information: [address] [place] [address] [phone]        Autumn Messing III, MD Follow up in 1 week(s).   Specialty:  General Surgery Why:  for staple removal Contact information: [place] [address] [phone]  [facility] Follow up.   Specialty:  [place] Why:  [facility] nursing/physical therapy/aide Contact information: [place] [address] [phone]           Signed: Autumn Messing III 09/14/2018, 4:31 PM

## 2018-09-26 DIAGNOSIS — K5732 Diverticulitis of large intestine without perforation or abscess without bleeding: Secondary | ICD-10-CM | POA: Diagnosis not present

## 2018-09-26 DIAGNOSIS — N321 Vesicointestinal fistula: Secondary | ICD-10-CM | POA: Diagnosis not present

## 2018-09-26 DIAGNOSIS — C182 Malignant neoplasm of ascending colon: Secondary | ICD-10-CM | POA: Diagnosis not present

## 2018-09-28 ENCOUNTER — Encounter: Payer: Self-pay | Admitting: Cardiology

## 2018-09-28 ENCOUNTER — Ambulatory Visit: Payer: Medicare Other | Admitting: Cardiology

## 2018-09-28 ENCOUNTER — Inpatient Hospital Stay: Payer: Medicare Other | Admitting: Family Medicine

## 2018-09-28 VITALS — BP 144/60 | HR 57 | Ht 66.0 in | Wt 183.0 lb

## 2018-09-28 DIAGNOSIS — I350 Nonrheumatic aortic (valve) stenosis: Secondary | ICD-10-CM

## 2018-09-28 DIAGNOSIS — I272 Pulmonary hypertension, unspecified: Secondary | ICD-10-CM

## 2018-09-28 DIAGNOSIS — I10 Essential (primary) hypertension: Secondary | ICD-10-CM | POA: Diagnosis not present

## 2018-09-28 DIAGNOSIS — I4821 Permanent atrial fibrillation: Secondary | ICD-10-CM

## 2018-09-28 DIAGNOSIS — I2510 Atherosclerotic heart disease of native coronary artery without angina pectoris: Secondary | ICD-10-CM

## 2018-09-28 DIAGNOSIS — I77810 Thoracic aortic ectasia: Secondary | ICD-10-CM

## 2018-09-28 DIAGNOSIS — E785 Hyperlipidemia, unspecified: Secondary | ICD-10-CM

## 2018-09-28 DIAGNOSIS — I5032 Chronic diastolic (congestive) heart failure: Secondary | ICD-10-CM | POA: Diagnosis not present

## 2018-09-28 MED ORDER — AMLODIPINE BESYLATE 5 MG PO TABS: 7.5000 mg | ORAL_TABLET | Freq: Every day | ORAL | 3 refills | Status: DC

## 2018-09-28 NOTE — Patient Instructions (Signed)
Medication Instructions:  Increase: Amlodipine 7.5 mg, daily  If you need a refill on your cardiac medications before your next appointment, please call your pharmacy.   Lab work: None If you have labs (blood work) drawn today and your tests are completely normal, you will receive your results only by: Marland Kitchen MyChart Message (if you have MyChart) OR . A paper copy in the mail If you have any lab test that is abnormal or we need to change your treatment, we will call you to review the results.  Testing/Procedures: Your physician has requested that you have an echocardiogram around 08/07/19. Echocardiography is a painless test that uses sound waves to create images of your heart. It provides your doctor with information about the size and shape of your heart and how well your heart's chambers and valves are working. This procedure takes approximately one hour. There are no restrictions for this procedure.  Your physician has recommended that you have a pulmonary function test. Pulmonary Function Tests are a group of tests that measure how well air moves in and out of your lungs.  Follow-Up: At [facility], you and your health needs are our priority.  As part of our continuing mission to provide you with exceptional heart care, we have created designated Provider Care Teams.  These Care Teams include your primary Cardiologist (physician) and Advanced Practice Providers (APPs -  Physician Assistants and Nurse Practitioners) who all work together to provide you with the care you need, when you need it.  Your physician recommends that you schedule a follow-up appointment in: 6 months with PA.    You will need a follow up appointment in 1 years.  Please call our office 2 months in advance to schedule this appointment.  You Suski see Fransico Him, MD or one of the following Advanced Practice Providers on your designated Care Team:   [place], PA-C Melina Copa, PA-C . Ermalinda Barrios, PA-C

## 2018-09-28 NOTE — Progress Notes (Signed)
Cardiology Office Note:    Date:  09/28/2018   ID:  [name], DOB 19-Nov-1934, MRN [id number]  PCP:  [name], MD  Cardiologist:  Fransico Him, MD    Referring MD: Orlena Sheldon, PA-C   Chief Complaint  Patient presents with  . Coronary Artery Disease  . Hypertension  . Hyperlipidemia  . Congestive Heart Failure    History of Present Illness:    [name] is a 84 y.o. female with a hx of of Takotsubo CM 2016, with recovered EF, nonobstructive CAD, HTN, chronic diastolic heart failure, pulmonary hypertension, permanent atrial fibrillation.    She was recently hospitalized back in December to undergo a laparoscopic sigmoid colectomy with cystoscopy and attempt pulmonary left ureteral stent placement for colovesical fistula.  This was done on 09/04/2017.  Developed an episode of sharp chest pain trocar site the next day and dropped her oxygen sats to the mid 80s.  D-dimer was elevated at 3.   Chest CT showed no evidence of PE.  Her troponin bumped slightly to 0.07.  Cardiology was consulted.  She had a recent echo and stress test that was normal it was felt that likely her troponin bump was due to demand ischemia.  She was also felt to be volume overloaded and was treated with IV Lasix.  Limited echo showed no regional wall motion normalities with normal EF.    She is here today for followup and is doing well.  She denies any anginal chest pain or pressure,  PND, orthopnea,  dizziness, palpitations or syncope.  She still has chronic shortness of breath that is not changed any and she also has chronic lower extremity edema which is actually improved since her surgery and remains on diuretics.  She has had few episodes of sharp stabbing pain since she got out of the hospital but she says it only lasts a second at a time is very infrequent.  She is compliant with her meds and is tolerating meds with no SE.    Past Medical History:  Diagnosis Date  . Allergy    rhinitis  . Aortic  stenosis    mild by echo 06/2017  . Arthritis   . Bradycardia    a. 10/2017 -> beta blocker cut back due to HR 39.  . Breast cancer ([place]) 01/06/2012  . Cancer [facility])    right colon and left breast  . Chronic diastolic CHF (congestive heart failure) ([place])   . Colon cancer ([place]) 01/06/2012  . Colovesical fistula    Dr. Marlou Starks and [name] planning surgery 08/2018  . COPD (chronic obstructive pulmonary disease) ([place])    pt. denies  . Coronary artery disease 2006   a.  NSTEMI in 2016, cath showed 15% prox-mid RCA, 20% prox LAD, EF 25-35% by cath and 35-40% -> felt due to Takotsubo cardiomyopathy.  . Dilated aortic root ([place])    73mmHg by echo 06/2017  . Diverticulosis   . Dyspnea   . Edema extremities   . GERD (gastroesophageal reflux disease)   . Hernia   . Hiatal hernia    denies  . Hyperlipidemia   . Hypertension   . Mild aortic stenosis    echo 11/2015 but not noted on echo 06/2016  . Osteopenia   . Permanent atrial fibrillation    chronic atrial fibrillation  . Pneumonia    hx child  . Pulmonary HTN ([place])    a. moderate to severe PASP 64mmHg echo 11/2015 -  now 74mmHg by echo 06/2017. CTA chest in 11/16 with no PE. PFTs in 7/15 with mild obstructive lung disease. She had a negative sleep study in 2017. b. Felt due to left sided HF.  Marland Kitchen Stroke ([place])   . Takotsubo syndrome 07/29/2015   a. EF 35-40% by echo; akinesis of mid-apical anteroseptal and apical myocardium.  EF now normalized on echo 11/2015    Past Surgical History:  Procedure Laterality Date  . APPENDECTOMY    . BREAST SURGERY     lumpectomy left  . CARDIAC CATHETERIZATION    . CARDIAC CATHETERIZATION N/A 07/28/2015   Procedure: Left Heart Cath and Coronary Angiography;  Surgeon: Peter M Martinique, MD;  Location: [facility];  Service: Cardiovascular;  Laterality: N/A;  . CHOLECYSTECTOMY    . COLECTOMY     right side  . CYSTOSCOPY WITH STENT PLACEMENT Left 09/04/2018   Procedure: CYSTOSCOPY WITH LEFT STENT  PLACEMENT, BLADDER REPAIR, CYSTOSCOPY WITH LEFT STENT REMOVAL;  Surgeon: Raynelle Bring, MD;  Location: [facility];  Service: Urology;  Laterality: Left;  . EXCISION OF ACCESSORY NIPPLE Bilateral 05/30/2013   Procedure: BILATERAL NIPPLE BIOPSY;  Surgeon: Merrie Roof, MD;  Location: [facility];  Service: General;  Laterality: Bilateral;  . EYE SURGERY Bilateral 12   cataracts  . IR RADIOLOGIST EVAL & MGMT  07/26/2017  . LAPAROSCOPIC RIGHT COLECTOMY N/A 09/04/2018   Procedure: LAPAROSCOPIC ASSISTED SIGMOID COLECTOMY WITH REPAIR OF FISTULA TO BLADDER;  Surgeon: Autumn Messing III, MD;  Location: [facility];  Service: General;  Laterality: N/A;  . SPLIT NIGHT STUDY  02/02/2016        Current Medications: Current Meds  Medication Sig  . acetaminophen (TYLENOL) 500 MG tablet Take 500-1,000 mg by mouth every 6 (six) hours as needed for headache (pain).  Marland Kitchen apixaban (ELIQUIS) 5 MG TABS tablet Take 1 tablet (5 mg total) by mouth 2 (two) times daily. (Patient taking differently: Take 5 mg by mouth 2 (two) times daily. )  . augmented betamethasone dipropionate (DIPROLENE-AF) 0.05 % ointment Apply 1 application topically 2 (two) times daily as needed (for skin irritation/rashes.).   Marland Kitchen clonazePAM (KLONOPIN) 0.5 MG tablet TAKE 1 TABLET BY MOUTH TWICE DAILY AS NEEDED FOR ANXIETY (Patient taking differently: Take 0.5 mg by mouth 2 (two) times daily as needed (anxiety/sleep.). )  . cloNIDine (CATAPRES) 0.1 MG tablet Take 1 tablet (0.1 mg total) by mouth 2 (two) times daily.  . Guaifenesin (MUCINEX MAXIMUM STRENGTH) 1200 MG TB12 Take 1,200 mg by mouth daily as needed (congestion).  Marland Kitchen HYDROcodone-acetaminophen (NORCO/VICODIN) 5-325 MG tablet Take 1-2 tablets by mouth every 6 (six) hours as needed for moderate pain or severe pain.  Marland Kitchen losartan (COZAAR) 100 MG tablet TAKE 1 TABLET BY MOUTH ONCE DAILY (Patient taking differently: Take 100 mg by mouth daily. )  . LUBRICANT EYE DROPS 0.4-0.3 % SOLN Place 1 drop into both eyes 3  (three) times daily as needed (for dry/irritated eyes.).  Marland Kitchen metoprolol succinate (TOPROL XL) 25 MG 24 hr tablet Take 1 tablet (25 mg total) by mouth daily.  . metroNIDAZOLE (FLAGYL) 500 MG tablet Take 1,000 mg by mouth See admin instructions. Take 2 tablets (1000 mg) by mouth at 2 pm, 3 pm, & 10 pm the day prior to your colon operation  . mirtazapine (REMERON) 15 MG tablet TAKE 1 TABLET BY MOUTH AT BEDTIME (Patient taking differently: Take 15 mg by mouth at bedtime. )  . nitroGLYCERIN (NITROSTAT) 0.4 MG SL tablet PLACE  ONE TABLET UNDER THE TONGUE EVERY FIVE MINUTES AS NEEDED FOR CHEST PAIN. [place] REPEAT FOR THREE DOSES. (Patient taking differently: Place 0.4 mg under the tongue every 5 (five) minutes x 3 doses as needed for chest pain. )  . omeprazole (PRILOSEC OTC) 20 MG tablet Take 20 mg by mouth daily as needed (heartburn/indigestion.).   Marland Kitchen ondansetron (ZOFRAN ODT) 4 MG disintegrating tablet Take 1 tablet (4 mg total) by mouth every 8 (eight) hours as needed for nausea or vomiting.  Marland Kitchen spironolactone (ALDACTONE) 25 MG tablet TAKE 1/2 (ONE-HALF) TABLET BY MOUTH ONCE DAILY (Patient taking differently: Take 12.5 mg by mouth daily. )  . torsemide (DEMADEX) 20 MG tablet Take 3 tablets (60 mg total) by mouth daily.  . [DISCONTINUED] amLODipine (NORVASC) 5 MG tablet TAKE 1 TABLET BY MOUTH ONCE DAILY (Patient taking differently: Take 5 mg by mouth daily. )     Allergies:   Contrast media [iodinated diagnostic agents]; Clonidine derivatives; Losartan; Statins; Sulfa antibiotics; Celebrex [celecoxib]; Isosorbide nitrate; Other; and Tape   Social History   Socioeconomic History  . Marital status: Married    Spouse name: Not on file  . Number of children: Not on file  . Years of education: Not on file  . Highest education level: Not on file  Occupational History  . Not on file  Social Needs  . Financial resource strain: Not on file  . Food insecurity:    Worry: Not on file    Inability: Not on file    . Transportation needs:    Medical: Not on file    Non-medical: Not on file  Tobacco Use  . Smoking status: Never Smoker  . Smokeless tobacco: Never Used  Substance and Sexual Activity  . Alcohol use: No  . Drug use: No  . Sexual activity: Not Currently  Lifestyle  . Physical activity:    Days per week: Not on file    Minutes per session: Not on file  . Stress: Not on file  Relationships  . Social connections:    Talks on phone: Not on file    Gets together: Not on file    Attends religious service: Not on file    Active member of club or organization: Not on file    Attends meetings of clubs or organizations: Not on file    Relationship status: Not on file  Other Topics Concern  . Not on file  Social History Narrative  . Not on file     Family History: The patient's family history includes Cancer in her sister and sister; Heart attack in her mother; Heart disease in her mother; Heart disease (age of onset: 40) in her brother; Hypertension in her father. There is no history of Stroke.  ROS:   Please see the history of present illness.    ROS  All other systems reviewed and negative.   EKGs/Labs/Other Studies Reviewed:    The following studies were reviewed today: Hospital notes and echo  EKG:  EKG is not ordered today.   Recent Labs: 07/16/2018: ALT 14 09/08/2018: BUN 10; Creatinine, Ser 0.67; Hemoglobin 9.0; Platelets 191; Potassium 3.8; Sodium 137   Recent Lipid Panel    Component Value Date/Time   CHOL 145 02/14/2018 0927   TRIG 65 02/14/2018 0927   HDL 51 02/14/2018 0927   CHOLHDL 2.8 02/14/2018 0927   CHOLHDL 3.5 03/15/2016 1057   VLDL 23 03/15/2016 1057   LDLCALC 81 02/14/2018 0927    Physical Exam:  VS:  BP (!) 144/60   Pulse (!) 57   Ht 5\' 6"  (1.676 m)   Wt 183 lb (83 kg)   SpO2 96%   BMI 29.54 kg/m     Wt Readings from Last 3 Encounters:  09/28/18 183 lb (83 kg)  09/04/18 188 lb 2 oz (85.3 kg)  08/28/18 188 lb 2 oz (85.3 kg)      GEN:  Well nourished, well developed in no acute distress HEENT: Normal NECK: No JVD; No carotid bruits LYMPHATICS: No lymphadenopathy CARDIAC: RRR, no murmurs, rubs, gallops RESPIRATORY:  Clear to auscultation without rales, wheezing or rhonchi  ABDOMEN: Soft, non-tender, non-distended MUSCULOSKELETAL:  No edema; No deformity  SKIN: Warm and dry NEUROLOGIC:  Alert and oriented x 3 PSYCHIATRIC:  Normal affect   ASSESSMENT:    1. Coronary artery disease involving native coronary artery of native heart without angina pectoris   2. Essential hypertension   3. Permanent atrial fibrillation   4. Chronic diastolic CHF (congestive heart failure) ([place])   5. Dilated aortic root ([place])   6. Nonrheumatic aortic valve stenosis   7. Dyslipidemia, goal LDL below 70   8. Pulmonary HTN ([place])    PLAN:    In order of problems listed above:  1.  ASCAD - s/p NSTEMI with cath in 2016 showing 20% proximal LAD and 15% proximal to mid RCA with EF 25 to 30% c/w Takotsubo CM.  Recent nuclear stress test showed no inducible ischemia.  She was hospitalized recently for a bowel procedure done laparoscopically complained of some atypical chest pain near the trocar site.  Troponin was minimally elevated at 0.07.  2D echo limited showed normal LV function with no regional wall motion normalities.  Was felt that this was likely related to demand ischemia in the setting of recent abdominal surgery given that her pain was very atypical and occurred close to the trocar site. She is statin intolerant and is not on aspirin Eliquis.  In the setting of  recent normal nuclear stress test and only a very minimal elevation in troponin surgery volume overloaded in the very atypical nature of her chest discomfort, further evaluation at this time is not warranted unless she has recurrent chest pain.    2.  HTN -BP is borderline controlled on exam today.  She is concerned because she says her systolic blood pressure through the  day usually runs in the 150 to 160 mmHg range.  She will continue on Toprol-XL 25 mg daily, clonidine 0.1 mg twice daily, losartan 100 mg daily, spironolactone 12.5 mg daily.  I am going to increase her amlodipine to 7.5 mg daily to try to get better control of her systolic hypertension.  Creatinine was stable at 0.67 and potassium 3.8 on 09/2018.  3.  Permanent atrial fibrillation -heart rate is well controlled in atrial fibrillation on exam today.  She will continue on Toprol-XL 25 mg and  Eliquis 5 mg twice daily  4.  Chronic diastolic CHF - she does not appear volume overloaded.  Continue on torsemide 60 mg daily.  5.  Dilated aortic root -echo 08/2017 showed mildly dilated ascending aorta at 39 mm.  We will continue to follow this with serial echo.  6.  Aortic stenosis -echo 08/21/2017 showed mild aortic stenosis with a mean gradient of 11 mmHg.  7.  Hyperlipidemia - LDL goal is < 70.  Her LDL was 81 on 02/14/2017.  I will recheck an FLP and ALT.  She is statin  intolerant.  8.  Pulmonary hypertension - moderate with PASP of 62 mm right after her surgery on 09/08/2018.  This is been felt to be related to pulmonary venous hypertension from left-sided diastolic heart failure in the past.  Her PASP has trended upward but this was done right after her surgery and echo a few weeks prior to her surgery showed her PASP was only 53 mmHg.  In the past her PASP has been anywhere from 45 to 55 mmHg.  She was refused sleep study in the past.  We will repeat 2D echocardiogram in 1 year to make sure this is stable.  Will check PFTs as well.  I have spent a total of 40 minutes with patient reviewing recent hospital notes, echoes, stress test , telemetry, EKGs, labs and examining patient as well as establishing an assessment and plan that was discussed with the patient.  > 50% of time was spent in direct patient care.     Medication Adjustments/Labs and Tests Ordered: Current medicines are reviewed at length with  the patient today.  Concerns regarding medicines are outlined above.  Orders Placed This Encounter  Procedures  . ECHOCARDIOGRAM COMPLETE  . Pulmonary function test   Meds ordered this encounter  Medications  . amLODipine (NORVASC) 5 MG tablet    Sig: Take 1.5 tablets (7.5 mg total) by mouth daily.    Dispense:  135 tablet    Refill:  3    Dose increase, do not fill, patient will call.    Signed, Fransico Him, MD  09/28/2018 12:06 PM    [place]

## 2018-09-29 ENCOUNTER — Ambulatory Visit (INDEPENDENT_AMBULATORY_CARE_PROVIDER_SITE_OTHER): Payer: Medicare Other | Admitting: Family Medicine

## 2018-09-29 ENCOUNTER — Encounter: Payer: Self-pay | Admitting: Family Medicine

## 2018-09-29 VITALS — BP 132/62 | HR 64 | Temp 98.3°F | Resp 14 | Ht 67.0 in | Wt 183.0 lb

## 2018-09-29 DIAGNOSIS — I350 Nonrheumatic aortic (valve) stenosis: Secondary | ICD-10-CM | POA: Diagnosis not present

## 2018-09-29 DIAGNOSIS — D649 Anemia, unspecified: Secondary | ICD-10-CM | POA: Diagnosis not present

## 2018-09-29 DIAGNOSIS — I2510 Atherosclerotic heart disease of native coronary artery without angina pectoris: Secondary | ICD-10-CM

## 2018-09-29 DIAGNOSIS — N321 Vesicointestinal fistula: Secondary | ICD-10-CM

## 2018-09-29 DIAGNOSIS — I5030 Unspecified diastolic (congestive) heart failure: Secondary | ICD-10-CM

## 2018-09-29 DIAGNOSIS — I4819 Other persistent atrial fibrillation: Secondary | ICD-10-CM

## 2018-09-29 NOTE — Progress Notes (Signed)
Subjective:    Patient ID: [name], female    DOB: Dec 05, 1934, 84 y.o.   MRN: [id number]  Patient is an 84 y/o WF with PMH significant for Takotsubo cardiomyopathy and nonobstructive CAD, with atrial fibrillation who was recently admitted to the hospital for surgery to correct her underlying colovesical fistula. I have copied relevant portions of her discharge summary and included them below for my reference:  Admit date: 09/04/2018 Discharge date: 09/14/2018  Admission Diagnoses:  Discharge Diagnoses:  Active Problems:   Diverticulitis large intestine w/o perforation or abscess w/o bleeding   Discharged Condition: good  Hospital Course: the pt underwent sigmoid colectomy and repair colovesicle fistula. She tolerated surgery well. She had some chest discomfort and SOB on pod 2. She was found to have a slightly elevated troponin. Cards was consulted. She gradually improved and diet slowly advanced. After about a week she was ready for discharge home. The foley remainded in and she has follow up with urology  09/29/18 Patient is here today for follow-up.  Reviewing her hospital records, her troponin was elevated slightly after her surgery up to 0.07.  She was also complaining of some shortness of breath.  Reviewing hospital records showed that she was fluid overloaded.  Her elevated troponin given her negative stress test was felt to be due to demand ischemia after surgery in the setting of congestive heart failure.  Patient was diuresed and her chest pain improved.  I reviewed her outpatient follow-up with her cardiologist Dr. [name] yesterday.  Dr. [name] increased her amlodipine due to her elevated blood pressure.  Blood pressure today is well controlled.  She does have trace bipedal edema and faint bibasilar crackles but I believe the crackles in her lungs are more consistent with atelectasis.  She denies any chest pain or shortness of breath or dyspnea on exertion.  She denies any  abdominal pain.  She denies any melena or hematochezia.  She denies any stool or blood coming from her vagina or from her bladder.  I examined her wounds from her surgery.  The vertical midline incision is well-healed with no evidence of cellulitis.  There are numerous port sites that are also well-healed.  Her abdomen is soft.  She is tolerating all food well with no complications.  She does complain of some mild occasional right lower back pain that is muscular in nature and precipitated by prolonged sitting.  She uses an occasional pain pill for this but otherwise is doing well.  Preoperatively, her hemoglobin was 11.1.  Postoperatively, her hemoglobin dropped to 9.  I do not see a repeat hemoglobin after surgery since January 3.  She is back on her Eliquis.  She denies any visible blood loss.  Past Medical History:  Diagnosis Date  . Allergy    rhinitis  . Aortic stenosis    mild by echo 06/2017  . Arthritis   . Bradycardia    a. 10/2017 -> beta blocker cut back due to HR 39.  . Breast cancer ([place]) 01/06/2012  . Cancer [facility])    right colon and left breast  . Chronic diastolic CHF (congestive heart failure) ([place])   . Colon cancer ([place]) 01/06/2012  . Colovesical fistula    Dr. Marlou Starks and [name] planning surgery 08/2018  . COPD (chronic obstructive pulmonary disease) ([place])    pt. denies  . Coronary artery disease 2006   a.  NSTEMI in 2016, cath showed 15% prox-mid RCA, 20% prox LAD, EF 25-35% by  cath and 35-40% -> felt due to Takotsubo cardiomyopathy.  . Dilated aortic root ([place])    67mmHg by echo 06/2017  . Diverticulosis   . Dyspnea   . Edema extremities   . GERD (gastroesophageal reflux disease)   . Hernia   . Hiatal hernia    denies  . Hyperlipidemia   . Hypertension   . Mild aortic stenosis    echo 11/2015 but not noted on echo 06/2016  . Osteopenia   . Permanent atrial fibrillation    chronic atrial fibrillation  . Pneumonia    hx child  . Pulmonary HTN ([place])    a.  moderate to severe PASP 78mmHg echo 11/2015 - now 62mmHg by echo 06/2017. CTA chest in 11/16 with no PE. PFTs in 7/15 with mild obstructive lung disease. She had a negative sleep study in 2017. b. Felt due to left sided HF.  Marland Kitchen Stroke ([place])   . Takotsubo syndrome 07/29/2015   a. EF 35-40% by echo; akinesis of mid-apical anteroseptal and apical myocardium.  EF now normalized on echo 11/2015   Past Surgical History:  Procedure Laterality Date  . APPENDECTOMY    . BREAST SURGERY     lumpectomy left  . CARDIAC CATHETERIZATION    . CARDIAC CATHETERIZATION N/A 07/28/2015   Procedure: Left Heart Cath and Coronary Angiography;  Surgeon: Peter M Martinique, MD;  Location: [facility];  Service: Cardiovascular;  Laterality: N/A;  . CHOLECYSTECTOMY    . COLECTOMY     right side  . CYSTOSCOPY WITH STENT PLACEMENT Left 09/04/2018   Procedure: CYSTOSCOPY WITH LEFT STENT PLACEMENT, BLADDER REPAIR, CYSTOSCOPY WITH LEFT STENT REMOVAL;  Surgeon: Raynelle Bring, MD;  Location: [facility];  Service: Urology;  Laterality: Left;  . EXCISION OF ACCESSORY NIPPLE Bilateral 05/30/2013   Procedure: BILATERAL NIPPLE BIOPSY;  Surgeon: Merrie Roof, MD;  Location: [facility];  Service: General;  Laterality: Bilateral;  . EYE SURGERY Bilateral 12   cataracts  . IR RADIOLOGIST EVAL & MGMT  07/26/2017  . LAPAROSCOPIC RIGHT COLECTOMY N/A 09/04/2018   Procedure: LAPAROSCOPIC ASSISTED SIGMOID COLECTOMY WITH REPAIR OF FISTULA TO BLADDER;  Surgeon: Jovita Kussmaul, MD;  Location: [facility];  Service: General;  Laterality: N/A;  . SPLIT NIGHT STUDY  02/02/2016       Current Outpatient Medications on File Prior to Visit  Medication Sig Dispense Refill  . acetaminophen (TYLENOL) 500 MG tablet Take 500-1,000 mg by mouth every 6 (six) hours as needed for headache (pain).    Marland Kitchen amLODipine (NORVASC) 5 MG tablet Take 1.5 tablets (7.5 mg total) by mouth daily. 135 tablet 3  . apixaban (ELIQUIS) 5 MG TABS tablet Take 1 tablet (5 mg total) by  mouth 2 (two) times daily. (Patient taking differently: Take 5 mg by mouth 2 (two) times daily. ) 180 tablet 1  . augmented betamethasone dipropionate (DIPROLENE-AF) 0.05 % ointment Apply 1 application topically 2 (two) times daily as needed (for skin irritation/rashes.).   1  . clonazePAM (KLONOPIN) 0.5 MG tablet TAKE 1 TABLET BY MOUTH TWICE DAILY AS NEEDED FOR ANXIETY (Patient taking differently: Take 0.5 mg by mouth 2 (two) times daily as needed (anxiety/sleep.). ) 60 tablet 2  . cloNIDine (CATAPRES) 0.1 MG tablet Take 1 tablet (0.1 mg total) by mouth 2 (two) times daily. 180 tablet 2  . Guaifenesin (MUCINEX MAXIMUM STRENGTH) 1200 MG TB12 Take 1,200 mg by mouth daily as needed (congestion).    Marland Kitchen HYDROcodone-acetaminophen (NORCO/VICODIN) 5-325 MG  tablet Take 1-2 tablets by mouth every 6 (six) hours as needed for moderate pain or severe pain. 15 tablet 0  . losartan (COZAAR) 100 MG tablet TAKE 1 TABLET BY MOUTH ONCE DAILY (Patient taking differently: Take 100 mg by mouth daily. ) 90 tablet 1  . LUBRICANT EYE DROPS 0.4-0.3 % SOLN Place 1 drop into both eyes 3 (three) times daily as needed (for dry/irritated eyes.).    Marland Kitchen metoprolol succinate (TOPROL XL) 25 MG 24 hr tablet Take 1 tablet (25 mg total) by mouth daily. 90 tablet 3  . mirtazapine (REMERON) 15 MG tablet TAKE 1 TABLET BY MOUTH AT BEDTIME (Patient taking differently: Take 15 mg by mouth at bedtime. ) 90 tablet 0  . nitroGLYCERIN (NITROSTAT) 0.4 MG SL tablet PLACE ONE TABLET UNDER THE TONGUE EVERY FIVE MINUTES AS NEEDED FOR CHEST PAIN. [place]ubb REPEAT FOR THREE DOSES. (Patient taking differently: Place 0.4 mg under the tongue every 5 (five) minutes x 3 doses as needed for chest pain. ) 30 tablet 2  . omeprazole (PRILOSEC OTC) 20 MG tablet Take 20 mg by mouth daily as needed (heartburn/indigestion.).     Marland Kitchen ondansetron (ZOFRAN ODT) 4 MG disintegrating tablet Take 1 tablet (4 mg total) by mouth every 8 (eight) hours as needed for nausea or vomiting. 20  tablet 0  . spironolactone (ALDACTONE) 25 MG tablet TAKE 1/2 (ONE-HALF) TABLET BY MOUTH ONCE DAILY (Patient taking differently: Take 12.5 mg by mouth daily. ) 45 tablet 3  . torsemide (DEMADEX) 20 MG tablet Take 3 tablets (60 mg total) by mouth daily. 90 tablet 11   No current facility-administered medications on file prior to visit.    Allergies  Allergen Reactions  . Contrast Media [Iodinated Diagnostic Agents] Hives and Other (See Comments)    Per pt strong burning sensation starting in chest radiating outward   . Clonidine Derivatives Other (See Comments)    Throat dry  . Losartan Itching  . Statins Other (See Comments)    "bones hurt"  . Sulfa Antibiotics Diarrhea    Tremors   . Celebrex [Celecoxib] Rash  . Isosorbide Nitrate Itching and Rash  . Other Itching and Rash    Plastic and paper tape and heart monitor pads  . Tape Itching and Rash    Red Where applied and will spread   Social History   Socioeconomic History  . Marital status: Married    Spouse name: Not on file  . Number of children: Not on file  . Years of education: Not on file  . Highest education level: Not on file  Occupational History  . Not on file  Social Needs  . Financial resource strain: Not on file  . Food insecurity:    Worry: Not on file    Inability: Not on file  . Transportation needs:    Medical: Not on file    Non-medical: Not on file  Tobacco Use  . Smoking status: Never Smoker  . Smokeless tobacco: Never Used  Substance and Sexual Activity  . Alcohol use: No  . Drug use: No  . Sexual activity: Not Currently  Lifestyle  . Physical activity:    Days per week: Not on file    Minutes per session: Not on file  . Stress: Not on file  Relationships  . Social connections:    Talks on phone: Not on file    Gets together: Not on file    Attends religious service: Not on file  Active member of club or organization: Not on file    Attends meetings of clubs or organizations: Not on  file    Relationship status: Not on file  . Intimate partner violence:    Fear of current or ex partner: Not on file    Emotionally abused: Not on file    Physically abused: Not on file    Forced sexual activity: Not on file  Other Topics Concern  . Not on file  Social History Narrative  . Not on file      Review of Systems  Musculoskeletal: Positive for back pain.  All other systems reviewed and are negative.      Objective:   Physical Exam  Constitutional: She is oriented to person, place, and time. She appears well-developed and well-nourished. No distress.  HENT:  Head: Normocephalic and atraumatic.  Nose: Nose normal.  Mouth/Throat: Oropharynx is clear and moist. No oropharyngeal exudate.  Neck: Neck supple. No JVD present. No thyromegaly present.  Cardiovascular: Normal rate. An irregularly irregular rhythm present. Exam reveals no gallop and no friction rub.  Murmur heard. Pulmonary/Chest: Effort normal. No respiratory distress. She has no wheezes. She has rales.  Abdominal: Normal appearance and bowel sounds are normal. She exhibits no distension. There is no abdominal tenderness. There is no rigidity, no rebound and no guarding.  Musculoskeletal:        General: Edema present.  Lymphadenopathy:    She has no cervical adenopathy.  Neurological: She is alert and oriented to person, place, and time. No cranial nerve deficit. She exhibits normal muscle tone. Coordination normal.  Skin: Skin is warm. No rash noted. She is not diaphoretic. No erythema.  Vitals reviewed.         Assessment & Plan:  Hospital discharge follow-up, postoperative anemia, history of diastolic congestive heart failure, nonobstructive coronary artery disease  Clinically the patient is doing well.  She denies any chest pain shortness of breath abdominal pain change in her bowel movements evidence of a urinary tract infection or any sign of postoperative infection.  However I am concerned  about her drop in hemoglobin postoperatively.  Therefore I would repeat a CBC today to monitor her hemoglobin.  I suspect some of the drop was dilutional given her fluid overload at that time and also as well due to blood loss during the surgery.  However I want to follow-up her hemoglobin to ensure that this is not trending further down given the fact she is back on her anticoagulation.  I will monitor her electrolytes.  Presently the patient has some mild edema but does not appear fluid overloaded.  I will make no change in her torsemide dose.  Her blood pressures well controlled.  Otherwise she is doing well.  Her heart rate is well controlled on metoprolol.  She is appropriately anticoagulated for her atrial fibrillation.  She is on an angiotensin receptor blocker and a beta-blocker for congestive heart failure.  Therefore I will make no changes in her medication at this time.

## 2018-09-30 LAB — CBC WITH DIFFERENTIAL/PLATELET
Absolute Monocytes: 788 cells/uL (ref 200–950)
BASOS PCT: 0.8 %
Basophils Absolute: 50 cells/uL (ref 0–200)
Eosinophils Absolute: 359 cells/uL (ref 15–500)
Eosinophils Relative: 5.7 %
HCT: 30.9 % — ABNORMAL LOW (ref 35.0–45.0)
HEMOGLOBIN: 10.4 g/dL — AB (ref 11.7–15.5)
Lymphs Abs: 2394 cells/uL (ref 850–3900)
MCH: 29.9 pg (ref 27.0–33.0)
MCHC: 33.7 g/dL (ref 32.0–36.0)
MCV: 88.8 fL (ref 80.0–100.0)
MONOS PCT: 12.5 %
MPV: 10.6 fL (ref 7.5–12.5)
Neutro Abs: 2709 cells/uL (ref 1500–7800)
Neutrophils Relative %: 43 %
Platelets: 264 10*3/uL (ref 140–400)
RBC: 3.48 10*6/uL — ABNORMAL LOW (ref 3.80–5.10)
RDW: 13.3 % (ref 11.0–15.0)
TOTAL LYMPHOCYTE: 38 %
WBC: 6.3 10*3/uL (ref 3.8–10.8)

## 2018-09-30 LAB — COMPLETE METABOLIC PANEL WITH GFR
AG Ratio: 1.1 (calc) (ref 1.0–2.5)
ALBUMIN MSPROF: 3.8 g/dL (ref 3.6–5.1)
ALT: 5 U/L — ABNORMAL LOW (ref 6–29)
AST: 14 U/L (ref 10–35)
Alkaline phosphatase (APISO): 69 U/L (ref 33–130)
BILIRUBIN TOTAL: 0.6 mg/dL (ref 0.2–1.2)
BUN/Creatinine Ratio: 22 (calc) (ref 6–22)
BUN: 24 mg/dL (ref 7–25)
CO2: 30 mmol/L (ref 20–32)
Calcium: 9.7 mg/dL (ref 8.6–10.4)
Chloride: 98 mmol/L (ref 98–110)
Creat: 1.11 mg/dL — ABNORMAL HIGH (ref 0.60–0.88)
GFR, Est African American: 54 mL/min/{1.73_m2} — ABNORMAL LOW (ref >=60)
GFR, Est Non African American: 47 mL/min/{1.73_m2} — ABNORMAL LOW (ref >=60)
Globulin: 3.4 g/dL (calc) (ref 1.9–3.7)
Glucose, Bld: 127 mg/dL — ABNORMAL HIGH (ref 65–99)
Potassium: 3.8 mmol/L (ref 3.5–5.3)
Sodium: 140 mmol/L (ref 135–146)
Total Protein: 7.2 g/dL (ref 6.1–8.1)

## 2018-09-30 LAB — VITAMIN B12: VITAMIN B 12: 468 pg/mL (ref 200–1100)

## 2018-09-30 LAB — IRON: Iron: 51 ug/dL (ref 45–160)

## 2018-10-06 ENCOUNTER — Ambulatory Visit (INDEPENDENT_AMBULATORY_CARE_PROVIDER_SITE_OTHER): Payer: Medicare Other | Admitting: Internal Medicine

## 2018-10-06 DIAGNOSIS — I272 Pulmonary hypertension, unspecified: Secondary | ICD-10-CM

## 2018-10-06 LAB — PULMONARY FUNCTION TEST
DL/VA % pred: 90 %
DL/VA: 4.32 ml/min/mmHg/L
DLCO cor % pred: 61 %
DLCO cor: 15.02 ml/min/mmHg
DLCO unc % pred: 55 %
DLCO unc: 13.43 ml/min/mmHg
FEF 25-75 Post: 1.62 L/sec
FEF 25-75 Pre: 0.84 L/sec
FEF2575-%Change-Post: 93 %
FEF2575-%PRED-POST: 119 %
FEF2575-%Pred-Pre: 61 %
FEV1-%CHANGE-POST: 19 %
FEV1-%PRED-POST: 72 %
FEV1-%Pred-Pre: 60 %
FEV1-POST: 1.39 L
FEV1-Pre: 1.16 L
FEV1FVC-%Change-Post: -1 %
FEV1FVC-%Pred-Pre: 99 %
FEV6-%Change-Post: 21 %
FEV6-%Pred-Post: 78 %
FEV6-%Pred-Pre: 65 %
FEV6-Post: 1.91 L
FEV6-Pre: 1.58 L
FEV6FVC-%Change-Post: 0 %
FEV6FVC-%Pred-Post: 105 %
FEV6FVC-%Pred-Pre: 105 %
FVC-%Change-Post: 21 %
FVC-%Pred-Post: 74 %
FVC-%Pred-Pre: 61 %
FVC-POST: 1.92 L
FVC-Pre: 1.58 L
POST FEV1/FVC RATIO: 72 %
Post FEV6/FVC ratio: 100 %
Pre FEV1/FVC ratio: 73 %
Pre FEV6/FVC Ratio: 99 %
RV % pred: 133 %
RV: 3.22 L
TLC % pred: 93 %
TLC: 4.74 L

## 2018-10-06 NOTE — Progress Notes (Signed)
PFT done today. 

## 2018-10-09 ENCOUNTER — Telehealth: Payer: Self-pay

## 2018-10-09 ENCOUNTER — Other Ambulatory Visit: Payer: Self-pay | Admitting: Cardiology

## 2018-10-09 ENCOUNTER — Other Ambulatory Visit (HOSPITAL_COMMUNITY): Payer: Self-pay | Admitting: Cardiology

## 2018-10-09 DIAGNOSIS — R942 Abnormal results of pulmonary function studies: Secondary | ICD-10-CM

## 2018-10-09 NOTE — Telephone Encounter (Signed)
The patient has been notified of the result and verbalized understanding.  All questions (if any) were answered. Sarina Ill, RN 10/09/2018 12:49 PM

## 2018-10-09 NOTE — Telephone Encounter (Signed)
-----   Message from [name], MD sent at 10/07/2018  9:44 PM EST ----- Abnormal PFTs please refer to Dr. Chase Caller

## 2018-10-12 ENCOUNTER — Other Ambulatory Visit: Payer: Self-pay | Admitting: Family Medicine

## 2018-10-12 DIAGNOSIS — K5792 Diverticulitis of intestine, part unspecified, without perforation or abscess without bleeding: Secondary | ICD-10-CM

## 2018-10-12 DIAGNOSIS — F064 Anxiety disorder due to known physiological condition: Secondary | ICD-10-CM

## 2018-10-12 MED ORDER — CLONAZEPAM 0.5 MG PO TABS: 0.5000 mg | ORAL_TABLET | Freq: Two times a day (BID) | ORAL | 1 refills | Status: DC | PRN

## 2018-10-12 NOTE — Telephone Encounter (Signed)
Requesting refill    Klonopin  LOV: 09/29/2018  LRF:   09/26/17

## 2018-10-12 NOTE — Telephone Encounter (Signed)
Pt needs refill on klonopin to [facility] [address]

## 2018-10-14 ENCOUNTER — Other Ambulatory Visit (HOSPITAL_COMMUNITY): Payer: Self-pay | Admitting: Cardiology

## 2018-10-16 ENCOUNTER — Telehealth: Payer: Self-pay | Admitting: Cardiology

## 2018-10-16 NOTE — Telephone Encounter (Signed)
Spoke with the patient, she asked when she would hear from [facility]. I advised that the referral was placed and that our schedulers sent a message to get her schedule. She had no further questions.

## 2018-10-16 NOTE — Telephone Encounter (Signed)
Pt should send refills to current cardiologist, Fransico Him, MD

## 2018-10-16 NOTE — Telephone Encounter (Signed)
New Message:    Patient calling concerning some results from a test she had last month.  Patient would like for some one to call her back.

## 2018-10-26 ENCOUNTER — Ambulatory Visit (INDEPENDENT_AMBULATORY_CARE_PROVIDER_SITE_OTHER): Payer: Medicare Other | Admitting: Internal Medicine

## 2018-10-26 ENCOUNTER — Encounter: Payer: Self-pay | Admitting: Internal Medicine

## 2018-10-26 VITALS — BP 116/58 | HR 45 | Ht 65.5 in | Wt 184.0 lb

## 2018-10-26 DIAGNOSIS — I272 Pulmonary hypertension, unspecified: Secondary | ICD-10-CM | POA: Diagnosis not present

## 2018-10-26 DIAGNOSIS — R0600 Dyspnea, unspecified: Secondary | ICD-10-CM

## 2018-10-26 DIAGNOSIS — R0609 Other forms of dyspnea: Secondary | ICD-10-CM | POA: Diagnosis not present

## 2018-10-26 MED ORDER — PANTOPRAZOLE SODIUM 40 MG PO TBEC: 40.0000 mg | DELAYED_RELEASE_TABLET | Freq: Every day | ORAL | 2 refills | Status: DC

## 2018-10-26 MED ORDER — FAMOTIDINE 20 MG PO TABS: ORAL_TABLET | ORAL | 11 refills | Status: DC

## 2018-10-26 NOTE — Progress Notes (Signed)
[name], female    DOB: [birth year],   MRN: [id number]   Brief patient profile:  58 yowf  never smoker with onset doe  2018 and doe s cough referred to pulmonary clinic 10/26/2018 by Dr   [name] with [name] but refused sleep study.    History of Present Illness  10/26/2018  Pulmonary/ 1st office eval/[name]  Chief Complaint  Patient presents with  . Pulmonary Consult    Referred by Dr. Radford [name] for eval of abormal PFT done 10/06/2018. Pt states has occ CP and DOE- started "a good while ago".    Dyspnea:  sev times a weeks a week to MB and stops about half way back uphill whereas 2 years ago could do this s stopping = MMRC1 = can walk nl pace, flat grade, can't hurry or go uphills or steps s sob  And some midline "stabbing" chest/abd discomfort (post op lap colectomy dec 2019) that   usually only comes on with ex but not with cough and does not lateralize or radiated Cough: no  Sleep: one pillow on side or back better since diuresis/ reduction in leg swelling   SABA use: ? seemed help in past    No obvious day to day or daytime variability or assoc excess/ purulent sputum or mucus plugs or hemoptysis or   subjective wheeze or overt sinus or hb symptoms.   Sleeping as abover  without nocturnal  or early am exacerbation  of respiratory  c/o's or need for noct saba. Also denies any obvious fluctuation of symptoms with weather or environmental changes or other aggravating or alleviating factors except as outlined above   No unusual exposure hx or h/o childhood pna/ asthma or knowledge of premature birth.  Current Allergies, Complete Past Medical History, Past Surgical History, Family History, and Social History were reviewed in [employer] record.  ROS  The following are not active complaints unless bolded Hoarseness, sore throat, dysphagia, dental problems, itching, sneezing,  nasal congestion or discharge of excess mucus or purulent secretions, ear ache,   fever,  chills, sweats, unintended wt loss or wt gain, classically pleuritic or exertional cp,  orthopnea pnd or arm/hand swelling  or leg swelling improved , presyncope, palpitations, abdominal pain, anorexia, nausea, vomiting, diarrhea  or change in bowel habits or change in bladder habits, change in stools or change in urine, dysuria, hematuria,  rash, arthralgias, visual complaints, headache, numbness, weakness or ataxia or problems with walking or coordination,  change in mood or  memory.             Past Medical History:  Diagnosis Date  . Allergy    rhinitis  . Aortic stenosis    mild by echo 06/2017  . Arthritis   . Bradycardia    a. 10/2017 -> beta blocker cut back due to HR 39.  . Breast cancer ([place]) 01/06/2012  . Cancer [facility])    right colon and left breast  . Chronic diastolic CHF (congestive heart failure) ([place])   . Colon cancer ([place]) 01/06/2012  . Colovesical fistula    Dr. Marlou [name] and Dr. Alinda [name] planning surgery 08/2018  . COPD (chronic obstructive pulmonary disease) ([place])    pt. denies  . Coronary artery disease 2006   a.  NSTEMI in 2016, cath showed 15% prox-mid RCA, 20% prox LAD, EF 25-35% by cath and 35-40% -> felt due to Takotsubo cardiomyopathy.  . Dilated aortic root ([place])    68mmHg by echo  06/2017  . Diverticulosis   . Dyspnea   . Edema extremities   . GERD (gastroesophageal reflux disease)   . Hernia   . Hiatal hernia    denies  . Hyperlipidemia   . Hypertension   . Mild aortic stenosis    echo 11/2015 but not noted on echo 06/2016  . Osteopenia   . Permanent atrial fibrillation    chronic atrial fibrillation  . Pneumonia    hx child  . Pulmonary HTN ([place])    a. moderate to severe PASP 69mmHg echo 11/2015 - now 41mmHg by echo 06/2017. CTA chest in 11/16 with no PE. PFTs in 7/15 with mild obstructive lung disease. She had a negative sleep study in 2017. b. Felt due to left sided HF.  Marland Kitchen Stroke ([place])   . Takotsubo syndrome 07/29/2015   a. EF 35-40% by echo; akinesis  of mid-apical anteroseptal and apical myocardium.  EF now normalized on echo 11/2015    Outpatient Medications Prior to Visit  Medication Sig Dispense Refill  . acetaminophen (TYLENOL) 500 MG tablet Take 500-1,000 mg by mouth every 6 (six) hours as needed for headache (pain).    Marland Kitchen amLODipine (NORVASC) 5 MG tablet Take 1.5 tablets (7.5 mg total) by mouth daily. 135 tablet 3  . apixaban (ELIQUIS) 5 MG TABS tablet Take 1 tablet (5 mg total) by mouth 2 (two) times daily. (Patient taking differently: Take 5 mg by mouth 2 (two) times daily. ) 180 tablet 1  . augmented betamethasone dipropionate (DIPROLENE-AF) 0.05 % ointment Apply 1 application topically 2 (two) times daily as needed (for skin irritation/rashes.).   1  . clonazePAM (KLONOPIN) 0.5 MG tablet Take 1 tablet (0.5 mg total) by mouth 2 (two) times daily as needed (anxiety/sleep.). for anxiety 60 tablet 1  . cloNIDine (CATAPRES) 0.1 MG tablet TAKE 1 TABLET BY MOUTH TWICE DAILY 180 tablet 2  . Guaifenesin (MUCINEX MAXIMUM STRENGTH) 1200 MG TB12 Take 1,200 mg by mouth daily as needed (congestion).    Marland Kitchen HYDROcodone-acetaminophen (NORCO/VICODIN) 5-325 MG tablet Take 1-2 tablets by mouth every 6 (six) hours as needed for moderate pain or severe pain. 15 tablet 0  . losartan (COZAAR) 100 MG tablet TAKE 1 TABLET BY MOUTH ONCE DAILY (Patient taking differently: Take 100 mg by mouth daily. ) 90 tablet 1  . LUBRICANT EYE DROPS 0.4-0.3 % SOLN Place 1 drop into both eyes 3 (three) times daily as needed (for dry/irritated eyes.).    Marland Kitchen metoprolol succinate (TOPROL XL) 25 MG 24 hr tablet Take 1 tablet (25 mg total) by mouth daily. 90 tablet 3  . mirtazapine (REMERON) 15 MG tablet TAKE 1 TABLET BY MOUTH AT BEDTIME (Patient taking differently: Take 15 mg by mouth at bedtime. ) 90 tablet 0  . nitroGLYCERIN (NITROSTAT) 0.4 MG SL tablet PLACE ONE TABLET UNDER THE TONGUE EVERY FIVE MINUTES AS NEEDED FOR CHEST PAIN. Brubacher REPEAT FOR THREE DOSES. (Patient taking  differently: Place 0.4 mg under the tongue every 5 (five) minutes x 3 doses as needed for chest pain. ) 30 tablet 2  . omeprazole (PRILOSEC OTC) 20 MG tablet Take 20 mg by mouth daily as needed (heartburn/indigestion.).     Marland Kitchen ondansetron (ZOFRAN ODT) 4 MG disintegrating tablet Take 1 tablet (4 mg total) by mouth every 8 (eight) hours as needed for nausea or vomiting. 20 tablet 0  . spironolactone (ALDACTONE) 25 MG tablet TAKE 1/2 (ONE-HALF) TABLET BY MOUTH ONCE DAILY (Patient taking differently: Take 12.5 mg by mouth  daily. ) 45 tablet 3  . torsemide (DEMADEX) 20 MG tablet Take 3 tablets (60 mg total) by mouth daily. 90 tablet 11      Objective:     BP (!) 116/58 (BP Location: Left Arm, Cuff Size: Normal)   Pulse (!) 45   Ht 5' 5.5" (1.664 m)   Wt 184 lb (83.5 kg)   SpO2 96%   BMI 30.15 kg/m   SpO2: 96 %   RA   Wt Readings from Last 3 Encounters:  10/26/18 184 lb (83.5 kg)  09/29/18 183 lb (83 kg)  09/28/18 183 lb (83 kg)   vs   196  07/07/18      amb wf extremely evasive historian   HEENT: Edentulous / nl turbinates bilaterally, and oropharynx. Nl external ear canals without cough reflex   NECK :  without JVD/Nodes/TM/ nl carotid upstrokes bilaterally   LUNGS: no acc muscle use,  Nl contour chest which is clear to A and P bilaterally without cough on insp or exp maneuvers   CV:  RRR  no s3 or   II/VI sem with slt  increase in P2, and trace  bilateral sym LE edema   ABD:  soft and nontender with nl inspiratory excursion in the supine position. No bruits or organomegaly appreciated, bowel sounds nl  MS:  Nl gait/ ext warm without deformities, calf tenderness, cyanosis or clubbing No obvious joint restrictions   SKIN: warm and dry without lesions    NEURO:  alert, approp, nl sensorium with  no motor or cerebellar deficits apparent.        I personally reviewed images and agree with radiology impression as follows:   Chest CTa 09/05/18 1. No evidence of pulmonary  embolus. 2. Small bilateral pleural effusions. Bibasilar airspace opacities Sanda reflect atelectasis or possibly mild pneumonia. Mild interstitial prominence could reflect interstitial edema. 3. Borderline prominent 1.1 cm superior mediastinal node.   Labs ordered/ reviewed:      Chemistry      Component Value Date/Time   NA 140 09/29/2018 1228   NA 139 05/30/2017 1039   NA 140 08/08/2014 0951   K 3.8 09/29/2018 1228   K 4.2 08/08/2014 0951   CL 98 09/29/2018 1228   CL 102 02/09/2013 0947   CO2 30 09/29/2018 1228   CO2 26 08/08/2014 0951   BUN 24 09/29/2018 1228   BUN 19 05/30/2017 1039   BUN 14.4 08/08/2014 0951   CREATININE 1.11 (H) 09/29/2018 1228   CREATININE 0.8 08/08/2014 0951      Component Value Date/Time   CALCIUM 9.7 09/29/2018 1228                                                           Lab Results  Component Value Date   WBC 6.3 09/29/2018   HGB 10.4 (L) 09/29/2018   HCT 30.9 (L) 09/29/2018   MCV 88.8 09/29/2018   PLT 264 09/29/2018      Lab Results  Component Value Date   TSH 2.20 07/08/2016           Assessment   DOE (dyspnea on exertion) Onset 2018  - Echo 09/08/2018 Left ventricle: The cavity size was normal. Systolic function was   normal. The estimated ejection fraction was in the range of  60%   to 65%. Wall motion was normal; there were no regional wall   motion abnormalities. The study is not technically sufficient to   allow evaluation of LV diastolic function. - Aortic valve: Trileaflet; moderately thickened, moderately   calcified leaflets. - Mitral valve: Calcified annulus. There was mild regurgitation. - Left atrium: The atrium was mildly dilated. - Right ventricle: The cavity size was mildly dilated. Wall   thickness was normal. - Right atrium: The atrium was moderately dilated. - Tricuspid valve: There was mild regurgitation. - Pulmonary arteries: Systolic pressure was moderately increased.   PA peak pressure:  62 mm Hg (S). PFT's  10/06/2018  FEV1 1.16 (72 % ) ratio 0.72  p 19 % improvement from saba p nothing  prior to study with DLCO  55/61c % corrects to 90 % for alv volume  And min curvature/ ERV 53%  - 10/26/2018   Walked RA x one lap =  approx 250 ft -  slower than average pace/no SOB/stopped due to stomach discomfort with sats 99 and pulse only 54   Symptoms are markedly disproportionate to objective findings and not clear to what extent this is actually a pulmonary  problem but pt does appear to have difficult to sort out respiratory symptoms of unknown origin for which  DDX  = almost all start with A and  include Adherence, Ace Inhibitors, Acid Reflux, Active Sinus Disease, Alpha 1 Antitripsin deficiency, Anxiety masquerading as Airways dz,  ABPA,  Allergy(esp in young), Aspiration (esp in elderly), Adverse effects of meds,  Active smoking or Vaping, A bunch of PE's/clot burden (a few small clots can't cause this syndrome unless there is already severe underlying pulm or vascular dz with poor reserve),  Anemia or thyroid disorder, plus two Bs  = Bronchiectasis and Beta blocker use..and one C= CHF    Adherence is always the initial "prime suspect" and is a multilayered concern that requires a "trust but verify" approach in every patient - starting with knowing how to use medications, especially inhalers, correctly, keeping up with refills and understanding the fundamental difference between maintenance and prns vs those medications only taken for a very short course and then stopped and not refilled.  - return with all meds in hand using a trust but verify approach to confirm accurate Medication  Reconciliation The principal here is that until we are certain that the  patients are doing what we've asked, it makes no sense to ask them to do more (add trial of Breo next?)  ? Acid (or non-acid) GERD > always difficult to exclude as up to 75% of pts in some series report no assoc GI/ Heartburn symptoms> rec max  (24h)  acid suppression and diet restrictions/ reviewed and instructions given in writing.   ? Anemia/ thyroid dz >  Note she is mildly anemic, no recent tsh on record and probably should be checked es p since so sensitive to BB  ? Allergy/asthma > not enough variability in symptoms or assoc rhinitis/ cough or noct symptoms (now that she's been diuresed) to support this dx  ? Anxiety/depression/ deconditioning > usually at the bottom of this list of usual suspects but should be much higher on this pt's based on H and P and note already on psychotropics and Biello interfere with adherence and also interpretation of response or lack thereof to symptom management which can be quite subjective.   ? A bunch of PE's > see PH a/p  ? chf / cardiac  asthma > feeling better since diuresis/ did have small R effusion to support this dz / defer rx to Dr [name]     >>> Although she has [place] and mild airflow obstruction there is no indication that either problem is what is limiting her mobility at this point and no role for bronchodilators yet  - some of her symptoms in fact sound more like gerd than a pulmonary or cardiac limitation though I do note she has a very poor chronotropic response to exertion and must therefore be very sensitive to combined effects of BB and clonidine but leave this titration up to Dr [name].     Pulmonary HTN ([place]) No evidence for PE on CTa though if chronic TEPAH is being considered would really need a v/q  - since I think this is very unlikely I did not order it.  I do note she declined previous sleep study so least we can offer in this setting is ono on RA to screen for nocturnal hypoxemia. Would strongly consider RHC before considering any PH drug in this setting and in meantime just work hard at optimizing diuretic rx.   Total time devoted to counseling  > 50 % of initial 60 min office visit:  review case with pt/fm/ personally observing 02 walking study/  discussion of  options/alternatives/ personally creating written customized instructions  in presence of pt  then going over those specific  Instructions directly with the pt including how to use all of the meds but in particular covering each new medication in detail and the difference between the maintenance= "automatic" meds and the prns using an action plan format for the latter (If this problem/symptom => do that organization reading Left to right).  Please see AVS from this visit for a full list of these instructions which I personally wrote for this pt and  are unique to this visit.           Christinia Gully, MD 10/26/2018

## 2018-10-26 NOTE — Patient Instructions (Addendum)
Pantoprazole (protonix) 40 mg   Take  30-60 min before first meal of the day and Pepcid (famotidine)  20 mg one after supper  until return to office - this is the best way to tell whether stomach acid is contributing to your problem.   GERD (REFLUX)  is an extremely common cause of respiratory symptoms just like yours , many times with no obvious heartburn at all.    It can be treated with medication, but also with lifestyle changes including elevation of the head of your bed (ideally with 6 -8inch blocks under the headboard of your bed),  Smoking cessation, avoidance of late meals, excessive alcohol, and avoid fatty foods, chocolate, peppermint, colas, red wine, and acidic juices such as orange juice.  NO MINT OR MENTHOL PRODUCTS SO NO COUGH DROPS  USE SUGARLESS CANDY INSTEAD (Jolley ranchers or Stover's or Life Savers) or even ice chips will also do - the key is to swallow to prevent all throat clearing. NO OIL BASED VITAMINS - use powdered substitutes.  Avoid fish oil when coughing.  Keep walking mailbox and back as much /often as you can to help tell us whether you are making progress or need more evaluation/ treatment at your next visit (remember what I told you about the eye doctor)     Please schedule a follow up office visit in 6 weeks, call sooner if needed

## 2018-10-27 ENCOUNTER — Encounter: Payer: Self-pay | Admitting: Internal Medicine

## 2018-10-27 NOTE — Assessment & Plan Note (Addendum)
No evidence for PE on CTa though if chronic TEPAH is being considered would really need a v/q  - since I think this is very unlikely I did not order it.  I do note she declined previous sleep study so least we can offer in this setting is ono on RA to screen for nocturnal hypoxemia. Would strongly consider RHC before considering any PH drug in this setting and in meantime just work hard at optimizing diuretic rx.   Total time devoted to counseling  > 50 % of initial 60 min office visit:  review case with pt/fm/ personally observing 02 walking study/  discussion of options/alternatives/ personally creating written customized instructions  in presence of pt  then going over those specific  Instructions directly with the pt including how to use all of the meds but in particular covering each new medication in detail and the difference between the maintenance= "automatic" meds and the prns using an action plan format for the latter (If this problem/symptom => do that organization reading Left to right).  Please see AVS from this visit for a full list of these instructions which I personally wrote for this pt and  are unique to this visit.

## 2018-10-27 NOTE — Assessment & Plan Note (Addendum)
Onset 2018  - Echo 09/08/2018 Left ventricle: The cavity size was normal. Systolic function was   normal. The estimated ejection fraction was in the range of 60%   to 65%. Wall motion was normal; there were no regional wall   motion abnormalities. The study is not technically sufficient to   allow evaluation of LV diastolic function. - Aortic valve: Trileaflet; moderately thickened, moderately   calcified leaflets. - Mitral valve: Calcified annulus. There was mild regurgitation. - Left atrium: The atrium was mildly dilated. - Right ventricle: The cavity size was mildly dilated. Wall   thickness was normal. - Right atrium: The atrium was moderately dilated. - Tricuspid valve: There was mild regurgitation. - Pulmonary arteries: Systolic pressure was moderately increased.   PA peak pressure: 62 mm Hg (S). PFT's  10/06/2018  FEV1 1.16 (72 % ) ratio 0.72  p 19 % improvement from saba p nothing  prior to study with DLCO  55/61c % corrects to 90 % for alv volume  And min curvature/ ERV 53%  - 10/26/2018   Walked RA x one lap =  approx 250 ft -  slower than average pace/no SOB/stopped due to stomach discomfort with sats 99 and pulse only 54   Symptoms are markedly disproportionate to objective findings and not clear to what extent this is actually a pulmonary  problem but pt does appear to have difficult to sort out respiratory symptoms of unknown origin for which  DDX  = almost all start with A and  include Adherence, Ace Inhibitors, Acid Reflux, Active Sinus Disease, Alpha 1 Antitripsin deficiency, Anxiety masquerading as Airways dz,  ABPA,  Allergy(esp in young), Aspiration (esp in elderly), Adverse effects of meds,  Active smoking or Vaping, A bunch of PE's/clot burden (a few small clots can't cause this syndrome unless there is already severe underlying pulm or vascular dz with poor reserve),  Anemia or thyroid disorder, plus two Bs  = Bronchiectasis and Beta blocker use..and one C= CHF    Adherence  is always the initial "prime suspect" and is a multilayered concern that requires a "trust but verify" approach in every patient - starting with knowing how to use medications, especially inhalers, correctly, keeping up with refills and understanding the fundamental difference between maintenance and prns vs those medications only taken for a very short course and then stopped and not refilled.  - return with all meds in hand using a trust but verify approach to confirm accurate Medication  Reconciliation The principal here is that until we are certain that the  patients are doing what we've asked, it makes no sense to ask them to do more (add trial of Breo next?)  ? Acid (or non-acid) GERD > always difficult to exclude as up to 75% of pts in some series report no assoc GI/ Heartburn symptoms> rec max (24h)  acid suppression and diet restrictions/ reviewed and instructions given in writing.   ? Anemia/ thyroid dz >  Note she is mildly anemic, no recent tsh on record and probably should be checked es p since so sensitive to BB  ? Allergy/asthma > not enough variability in symptoms or assoc rhinitis/ cough or noct symptoms (now that she's been diuresed) to support this dx    ? Anxiety/depression/ deconditioning > usually at the bottom of this list of usual suspects but should be much higher on this pt's based on H and P and note already on psychotropics and [place] interfere with adherence and also interpretation  of response or lack thereof to symptom management which can be quite subjective.   ? A bunch of PE's > see PH a/p  ? chf / cardiac asthma > feeling better since diuresis/ did have small R effusion to support this dz / defer rx to Dr [name]     >>> Although she has PH and mild airflow obstruction there is no indication that either problem is what is limiting her mobility at this point and no role for bronchodilators yet  - some of her symptoms in fact sound more like gerd than a pulmonary or  cardiac limitation though I do note she has a very poor chronotropic response to exertion and must therefore be very sensitive to combined effects of BB and clonidine but leave this titration up to Dr [name].

## 2018-11-15 IMAGING — CT CT RENAL STONE PROTOCOL
2 of 4 series · 15 of 46 positions shown, 17 images · non-contrast
Comparison: 03/01/2018

CLINICAL DATA: Hematuria this morning.  Chronic back pain.

EXAM:
CT ABDOMEN AND PELVIS WITHOUT CONTRAST
TECHNIQUE: Multidetector CT imaging of the abdomen and pelvis was performed
following the standard protocol without IV contrast.

[Series 3: ap without · axial · non-contrast · 0.77mm/px · z∈[+800,+1180]mm · 12 of 84 slices shown, 14 images]
[im 4/84  soft-tissue]
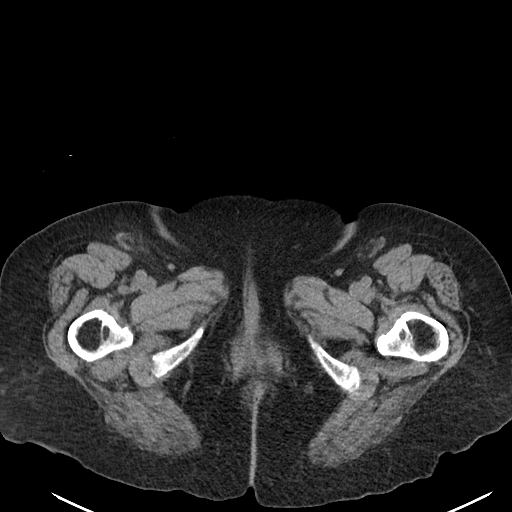
[im 4/84  bone]
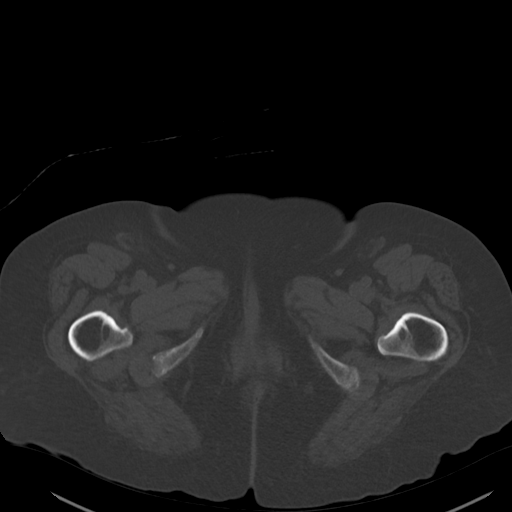
[im 12/84  soft-tissue]
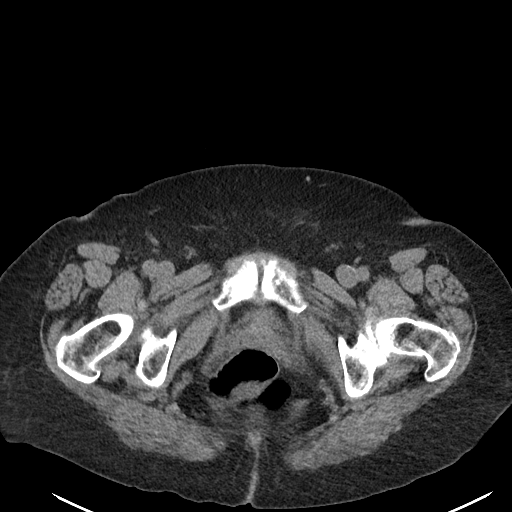
[im 20/84  soft-tissue]
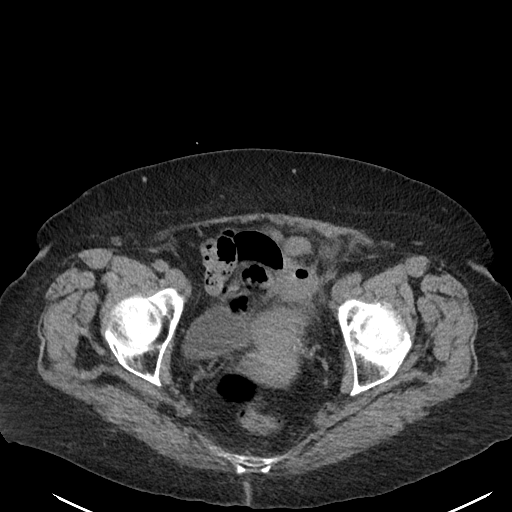
[im 24/84  soft-tissue]
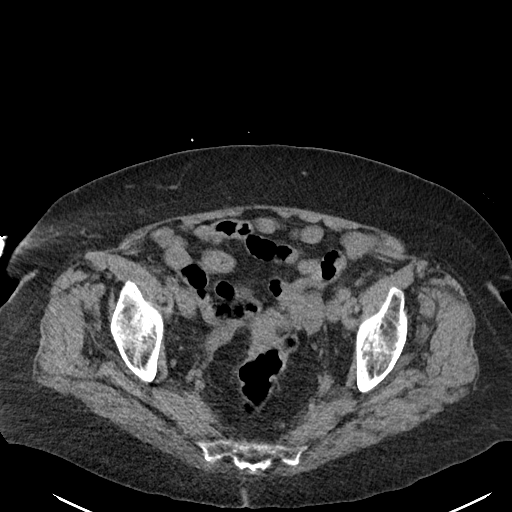
[im 32/84  soft-tissue]
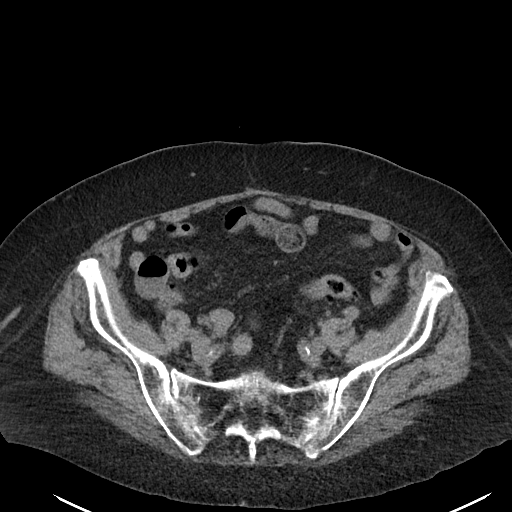
[im 40/84  soft-tissue]
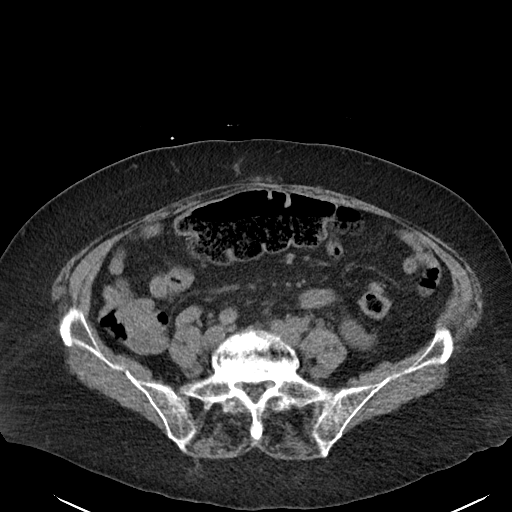
[im 44/84  soft-tissue]
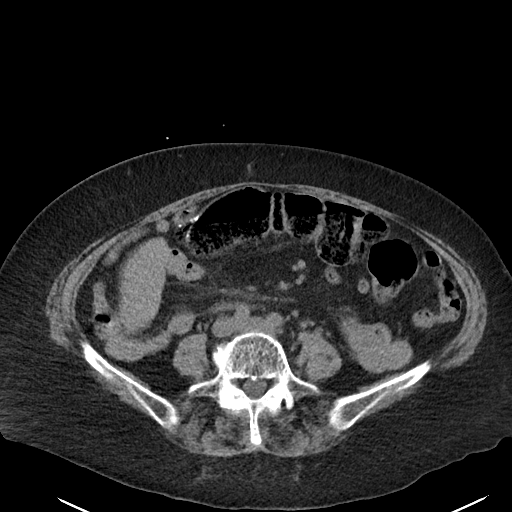
[im 52/84  soft-tissue]
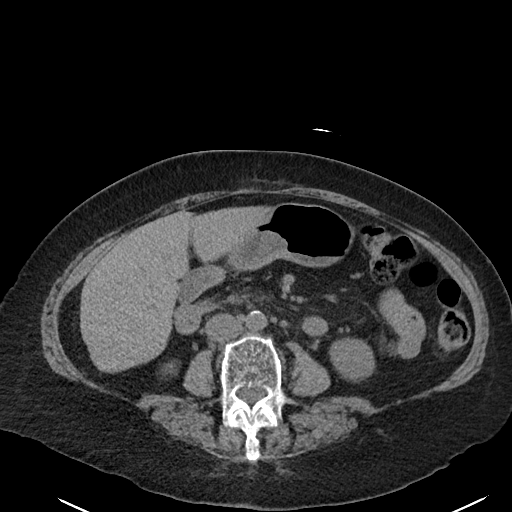
[im 60/84  soft-tissue]
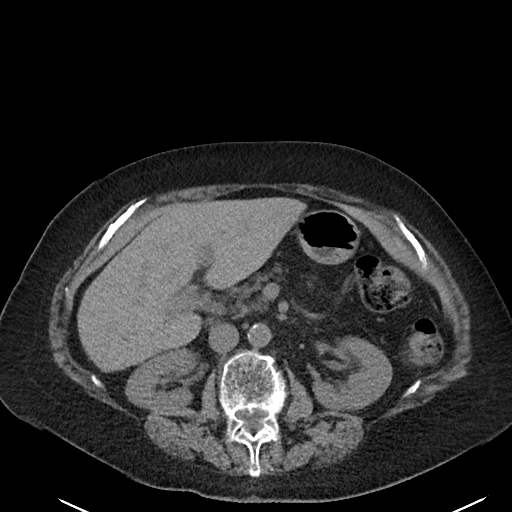
[im 60/84  bone]
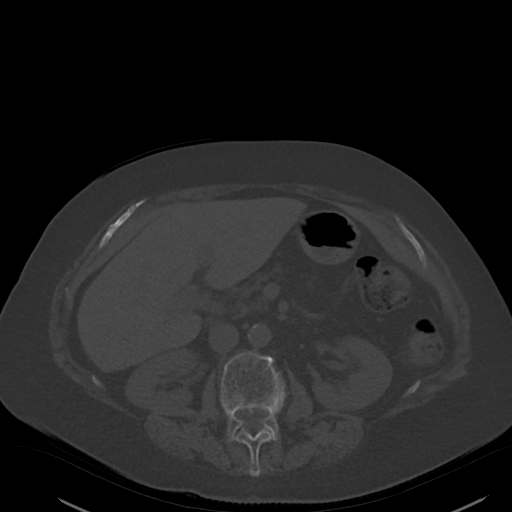
[im 64/84  soft-tissue]
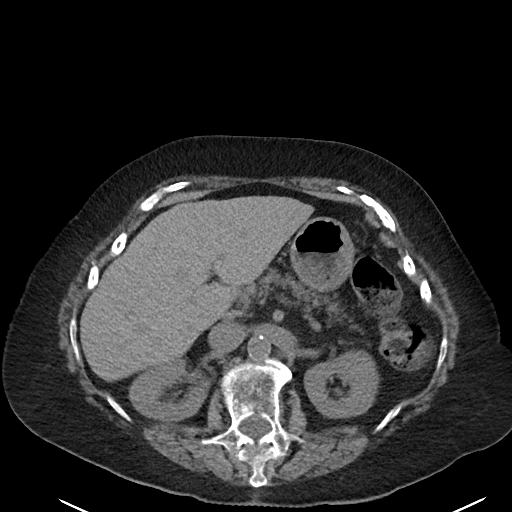
[im 72/84  soft-tissue]
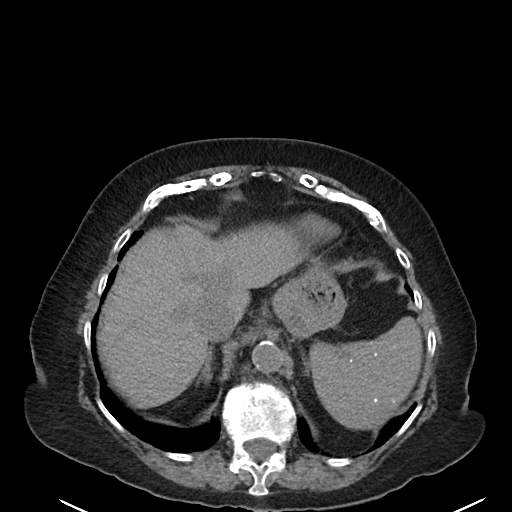
[im 80/84  soft-tissue]
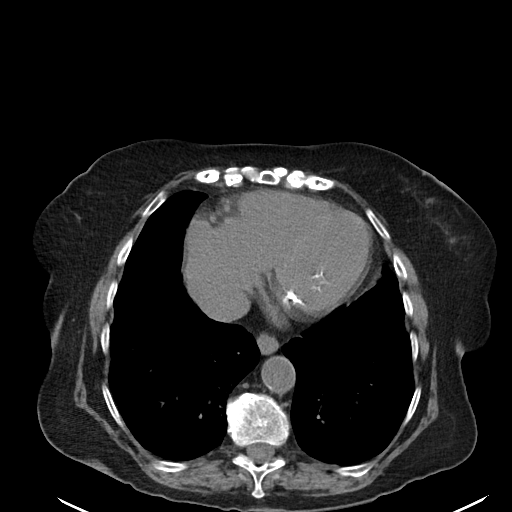

[Series 6: cor · coronal · 0.84mm/px · 3 of 102 slices shown]
[im 34/102  soft-tissue]
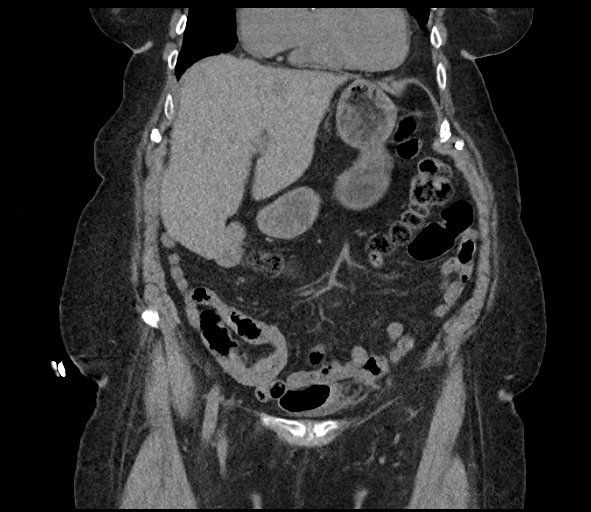
[im 45/102  soft-tissue]
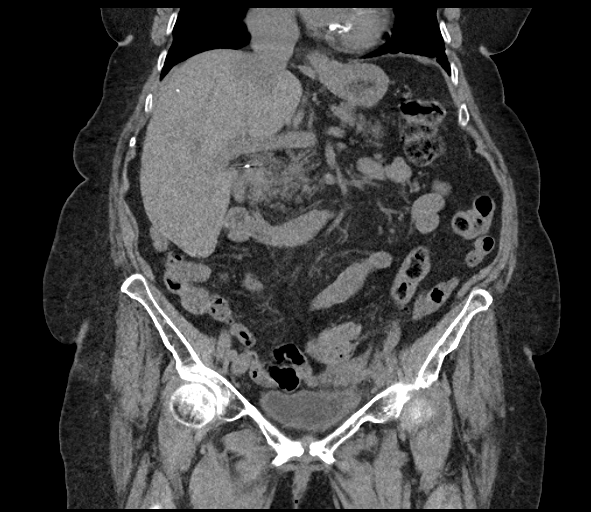
[im 57/102  soft-tissue]
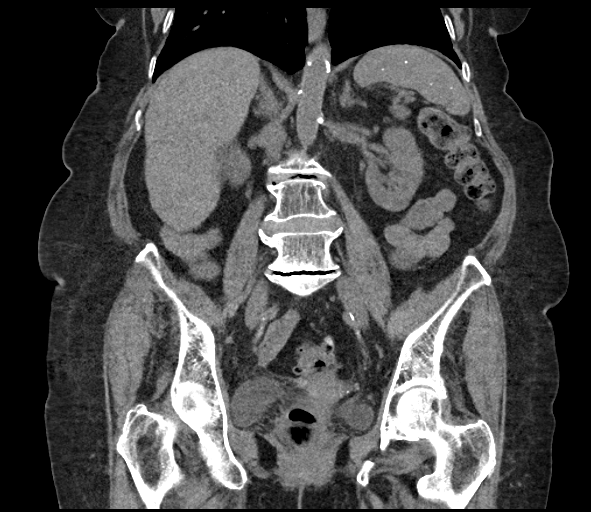

[15 of 46 positions shown; findings below may reference images not displayed]

FINDINGS: Lower chest: No acute findings.

Hepatobiliary: No focal liver abnormality is seen. Status post
cholecystectomy. No biliary dilatation.

Pancreas: Partial fatty replacement.  No mass or inflammation.

Spleen: Numerous calcifications consistent with healed granuloma.
Normal in size. No masses.

Adrenals/Urinary Tract: No adrenal masses.

Kidneys normal size, orientation and position. Small low-density
lesion, posterior midpole left kidney, consistent with a cyst,
stable from the prior study. No other renal masses. No stones. No
hydronephrosis. Ureters are normal in course and in caliber.

There is nondependent air in the bladder. There is abnormal soft
tissue that is contiguous with the left bladder wall. There is a
bubble of air within the soft tissue and a subtle tract that extends
to the left adnexa and adjacent sigmoid colon. Patient had a
collection contiguous with the bladder in this location on the prior
CT. Findings are consistent with a colovesicular fistula, likely
originally from diverticulitis. No other bladder abnormality.

Stomach/Bowel: There are numerous colonic diverticula. No evidence
of diverticulitis. There has been a previous right hemicolectomy.
Anastomosis staple line is noted in the right mid abdomen. No
colonic wall thickening or inflammation. Stomach and small bowel are
unremarkable.

Vascular/Lymphatic: Aortic atherosclerosis. No enlarged abdominal or
pelvic lymph nodes.

Reproductive: Uterus and bilateral adnexa are unremarkable.

Other: No abdominal wall hernia or abnormality. No abdominopelvic
ascites.

Musculoskeletal: Fracture and large Schmorl's node at L1, stable. No
acute fracture. No osteoblastic or osteolytic lesions.
IMPRESSION: 1. There is nondependent air within the bladder and abnormal soft
tissue along the left bladder wall with a small bubble of air within
the soft tissue. There is evidence of a subtle fistula from this
abnormal soft tissue to the left adnexa and adjacent sigmoid colon
consistent with a colovesicular fistula. This has developed in the
location of a previously seen small abscess/collection. This finding
accounts for the patient's hematuria.
2. No other evidence of an acute abnormality.
3. Multiple colonic diverticula without diverticulitis. Status post
right hemicolectomy.
4. Aortic atherosclerosis.

## 2018-11-20 ENCOUNTER — Encounter: Payer: Self-pay | Admitting: Family Medicine

## 2018-11-20 ENCOUNTER — Ambulatory Visit (INDEPENDENT_AMBULATORY_CARE_PROVIDER_SITE_OTHER): Payer: Medicare Other | Admitting: Family Medicine

## 2018-11-20 ENCOUNTER — Other Ambulatory Visit: Payer: Self-pay

## 2018-11-20 VITALS — BP 110/44 | HR 60 | Temp 97.9°F | Resp 16 | Ht 67.0 in | Wt 192.0 lb

## 2018-11-20 DIAGNOSIS — I889 Nonspecific lymphadenitis, unspecified: Secondary | ICD-10-CM | POA: Diagnosis not present

## 2018-11-20 MED ORDER — DOXYCYCLINE HYCLATE 100 MG PO TABS: 100.0000 mg | ORAL_TABLET | Freq: Two times a day (BID) | ORAL | 0 refills | Status: DC

## 2018-11-20 NOTE — Progress Notes (Signed)
Subjective:    Patient ID: [name], female    DOB: [birth year], 84 y.o.   MRN: [id number] Patient reports 2 days of worsening pain in the right side of her neck.  However she denies any pain with rotation of her neck either left or right.  She denies any pain with flexion or extension of her neck.  There is no pain with palpation along the cervical spinous processes or transverse processes.  There is no pain with palpation of the cervical paraspinal muscles.  There is no pain with palpation of the trapezius or the sternocleidomastoid muscle.  However there is a slightly enlarged right-sided lymph node just anterior to the sternocleidomastoid.  It is approximately 1.5 cm.  This is the point the patient reports maximum tenderness.  Palpating this lymph node reproduces the patient's pain.  There is no other lymphadenopathy in the neck.  Examination of her oropharynx is clear.  There is no otalgia.  There is no mucosal edema or rhinorrhea.  She denies any fevers or chills.  She denies any weight loss. Past Medical History:  Diagnosis Date  . Allergy    rhinitis  . Aortic stenosis    mild by echo 06/2017  . Arthritis   . Bradycardia    a. 10/2017 -> beta blocker cut back due to HR 39.  . Breast cancer ([place]) 01/06/2012  . Cancer [facility])    right colon and left breast  . Chronic diastolic CHF (congestive heart failure) ([place])   . Colon cancer ([place]) 01/06/2012  . Colovesical fistula    Dr. Marlou Starks and [name] planning surgery 08/2018  . COPD (chronic obstructive pulmonary disease) ([place])    pt. denies  . Coronary artery disease 2006   a.  NSTEMI in 2016, cath showed 15% prox-mid RCA, 20% prox LAD, EF 25-35% by cath and 35-40% -> felt due to Takotsubo cardiomyopathy.  . Dilated aortic root ([place])    50mmHg by echo 06/2017  . Diverticulosis   . Dyspnea   . Edema extremities   . GERD (gastroesophageal reflux disease)   . Hernia   . Hiatal hernia    denies  . Hyperlipidemia   . Hypertension   . Mild  aortic stenosis    echo 11/2015 but not noted on echo 06/2016  . Osteopenia   . Permanent atrial fibrillation    chronic atrial fibrillation  . Pneumonia    hx child  . Pulmonary HTN ([place])    a. moderate to severe PASP 53mmHg echo 11/2015 - now 59mmHg by echo 06/2017. CTA chest in 11/16 with no PE. PFTs in 7/15 with mild obstructive lung disease. She had a negative sleep study in 2017. b. Felt due to left sided HF.  Marland Kitchen Stroke ([place])   . Takotsubo syndrome 07/29/2015   a. EF 35-40% by echo; akinesis of mid-apical anteroseptal and apical myocardium.  EF now normalized on echo 11/2015   Past Surgical History:  Procedure Laterality Date  . APPENDECTOMY    . BREAST SURGERY     lumpectomy left  . CARDIAC CATHETERIZATION    . CARDIAC CATHETERIZATION N/A 07/28/2015   Procedure: Left Heart Cath and Coronary Angiography;  Surgeon: Peter M Martinique, MD;  Location: [facility];  Service: Cardiovascular;  Laterality: N/A;  . CHOLECYSTECTOMY    . COLECTOMY     right side  . CYSTOSCOPY WITH STENT PLACEMENT Left 09/04/2018   Procedure: CYSTOSCOPY WITH LEFT STENT PLACEMENT, BLADDER REPAIR, CYSTOSCOPY WITH LEFT  STENT REMOVAL;  Surgeon: Raynelle Bring, MD;  Location: [facility];  Service: Urology;  Laterality: Left;  . EXCISION OF ACCESSORY NIPPLE Bilateral 05/30/2013   Procedure: BILATERAL NIPPLE BIOPSY;  Surgeon: Merrie Roof, MD;  Location: [facility];  Service: General;  Laterality: Bilateral;  . EYE SURGERY Bilateral 12   cataracts  . IR RADIOLOGIST EVAL & MGMT  07/26/2017  . LAPAROSCOPIC RIGHT COLECTOMY N/A 09/04/2018   Procedure: LAPAROSCOPIC ASSISTED SIGMOID COLECTOMY WITH REPAIR OF FISTULA TO BLADDER;  Surgeon: Jovita Kussmaul, MD;  Location: [facility];  Service: General;  Laterality: N/A;  . SPLIT NIGHT STUDY  02/02/2016       Current Outpatient Medications on File Prior to Visit  Medication Sig Dispense Refill  . acetaminophen (TYLENOL) 500 MG tablet Take 500-1,000 mg by mouth every 6 (six) hours  as needed for headache (pain).    Marland Kitchen amLODipine (NORVASC) 5 MG tablet Take 1.5 tablets (7.5 mg total) by mouth daily. 135 tablet 3  . apixaban (ELIQUIS) 5 MG TABS tablet Take 1 tablet (5 mg total) by mouth 2 (two) times daily. (Patient taking differently: Take 5 mg by mouth 2 (two) times daily. ) 180 tablet 1  . augmented betamethasone dipropionate (DIPROLENE-AF) 0.05 % ointment Apply 1 application topically 2 (two) times daily as needed (for skin irritation/rashes.).   1  . clonazePAM (KLONOPIN) 0.5 MG tablet Take 1 tablet (0.5 mg total) by mouth 2 (two) times daily as needed (anxiety/sleep.). for anxiety 60 tablet 1  . cloNIDine (CATAPRES) 0.1 MG tablet TAKE 1 TABLET BY MOUTH TWICE DAILY 180 tablet 2  . famotidine (PEPCID) 20 MG tablet One at bedtime 30 tablet 11  . Guaifenesin (MUCINEX MAXIMUM STRENGTH) 1200 MG TB12 Take 1,200 mg by mouth daily as needed (congestion).    Marland Kitchen HYDROcodone-acetaminophen (NORCO/VICODIN) 5-325 MG tablet Take 1-2 tablets by mouth every 6 (six) hours as needed for moderate pain or severe pain. 15 tablet 0  . losartan (COZAAR) 100 MG tablet TAKE 1 TABLET BY MOUTH ONCE DAILY (Patient taking differently: Take 100 mg by mouth daily. ) 90 tablet 1  . LUBRICANT EYE DROPS 0.4-0.3 % SOLN Place 1 drop into both eyes 3 (three) times daily as needed (for dry/irritated eyes.).    Marland Kitchen metoprolol succinate (TOPROL XL) 25 MG 24 hr tablet Take 1 tablet (25 mg total) by mouth daily. 90 tablet 3  . mirtazapine (REMERON) 15 MG tablet TAKE 1 TABLET BY MOUTH AT BEDTIME (Patient taking differently: Take 15 mg by mouth at bedtime. ) 90 tablet 0  . nitroGLYCERIN (NITROSTAT) 0.4 MG SL tablet PLACE ONE TABLET UNDER THE TONGUE EVERY FIVE MINUTES AS NEEDED FOR CHEST PAIN. [place] REPEAT FOR THREE DOSES. (Patient taking differently: Place 0.4 mg under the tongue every 5 (five) minutes x 3 doses as needed for chest pain. ) 30 tablet 2  . ondansetron (ZOFRAN ODT) 4 MG disintegrating tablet Take 1 tablet (4 mg  total) by mouth every 8 (eight) hours as needed for nausea or vomiting. 20 tablet 0  . pantoprazole (PROTONIX) 40 MG tablet Take 1 tablet (40 mg total) by mouth daily. Take 30-60 min before first meal of the day 30 tablet 2  . spironolactone (ALDACTONE) 25 MG tablet TAKE 1/2 (ONE-HALF) TABLET BY MOUTH ONCE DAILY (Patient taking differently: Take 12.5 mg by mouth daily. ) 45 tablet 3  . torsemide (DEMADEX) 20 MG tablet Take 3 tablets (60 mg total) by mouth daily. 90 tablet 11   No  current facility-administered medications on file prior to visit.    Allergies  Allergen Reactions  . Contrast Media [Iodinated Diagnostic Agents] Hives and Other (See Comments)    Per pt strong burning sensation starting in chest radiating outward   . Clonidine Derivatives Other (See Comments)    Throat dry  . Losartan Itching  . Statins Other (See Comments)    "bones hurt"  . Sulfa Antibiotics Diarrhea    Tremors   . Celebrex [Celecoxib] Rash  . Isosorbide Nitrate Itching and Rash  . Other Itching and Rash    Plastic and paper tape and heart monitor pads  . Tape Itching and Rash    Red Where applied and will spread   Social History   Socioeconomic History  . Marital status: Married    Spouse name: Not on file  . Number of children: Not on file  . Years of education: Not on file  . Highest education level: Not on file  Occupational History  . Not on file  Social Needs  . Financial resource strain: Not on file  . Food insecurity:    Worry: Not on file    Inability: Not on file  . Transportation needs:    Medical: Not on file    Non-medical: Not on file  Tobacco Use  . Smoking status: Never Smoker  . Smokeless tobacco: Never Used  Substance and Sexual Activity  . Alcohol use: No  . Drug use: No  . Sexual activity: Not Currently  Lifestyle  . Physical activity:    Days per week: Not on file    Minutes per session: Not on file  . Stress: Not on file  Relationships  . Social connections:     Talks on phone: Not on file    Gets together: Not on file    Attends religious service: Not on file    Active member of club or organization: Not on file    Attends meetings of clubs or organizations: Not on file    Relationship status: Not on file  . Intimate partner violence:    Fear of current or ex partner: Not on file    Emotionally abused: Not on file    Physically abused: Not on file    Forced sexual activity: Not on file  Other Topics Concern  . Not on file  Social History Narrative  . Not on file      Review of Systems  Musculoskeletal: Positive for back pain.  All other systems reviewed and are negative.      Objective:   Physical Exam  Constitutional: She appears well-developed and well-nourished. No distress.  HENT:  Right Ear: External ear normal.  Left Ear: External ear normal.  Nose: Nose normal.  Mouth/Throat: Oropharynx is clear and moist. No oropharyngeal exudate.  Eyes: Conjunctivae are normal.  Neck: Trachea normal, normal range of motion and phonation normal. Neck supple. Normal carotid pulses and no JVD present. Carotid bruit is not present. No edema and no erythema present. No thyroid mass present.    Cardiovascular: Normal rate and normal heart sounds.  Pulmonary/Chest: Effort normal and breath sounds normal. No respiratory distress. She has no wheezes. She has no rales. She exhibits no tenderness.  Abdominal: Normal appearance and bowel sounds are normal. She exhibits no distension. There is no abdominal tenderness. There is no rigidity, no rebound and no guarding.  Musculoskeletal:        General: No edema.  Lymphadenopathy:    She  has cervical adenopathy.  Skin: Skin is warm. She is not diaphoretic. No erythema.  Vitals reviewed.         Assessment & Plan:  Lymphadenitis  Suspect lymphadenitis.  Begin doxycycline 100 mg p.o. twice daily for 7 days and then reassess later this week if no better.  Consider CT scan of the neck if the  lymph node enlarges or the pain worsens

## 2018-11-24 ENCOUNTER — Other Ambulatory Visit: Payer: Self-pay

## 2018-11-24 ENCOUNTER — Ambulatory Visit (INDEPENDENT_AMBULATORY_CARE_PROVIDER_SITE_OTHER): Payer: Medicare Other | Admitting: Family Medicine

## 2018-11-24 ENCOUNTER — Encounter: Payer: Self-pay | Admitting: Family Medicine

## 2018-11-24 VITALS — BP 136/70 | HR 48 | Temp 98.1°F | Resp 18 | Ht 67.0 in | Wt 192.0 lb

## 2018-11-24 DIAGNOSIS — J069 Acute upper respiratory infection, unspecified: Secondary | ICD-10-CM | POA: Diagnosis not present

## 2018-11-24 MED ORDER — CLOTRIMAZOLE 1 % EX CREA: 1.0000 "application " | TOPICAL_CREAM | Freq: Two times a day (BID) | CUTANEOUS | 0 refills | Status: DC

## 2018-11-24 NOTE — Progress Notes (Signed)
Subjective:    Patient ID: [name], female    DOB: 1935-01-01, 84 y.o.   MRN: [id number]  11/20/18 Patient reports 2 days of worsening pain in the right side of her neck.  However she denies any pain with rotation of her neck either left or right.  She denies any pain with flexion or extension of her neck.  There is no pain with palpation along the cervical spinous processes or transverse processes.  There is no pain with palpation of the cervical paraspinal muscles.  There is no pain with palpation of the trapezius or the sternocleidomastoid muscle.  However there is a slightly enlarged right-sided lymph node just anterior to the sternocleidomastoid.  It is approximately 1.5 cm.  This is the point the patient reports maximum tenderness.  Palpating this lymph node reproduces the patient's pain.  There is no other lymphadenopathy in the neck.  Examination of her oropharynx is clear.  There is no otalgia.  There is no mucosal edema or rhinorrhea.  She denies any fevers or chills.  She denies any weight loss.  At that time, my plan was: Suspect lymphadenitis.  Begin doxycycline 100 mg p.o. twice daily for 7 days and then reassess later this week if no better.  Consider CT scan of the neck if the lymph node enlarges or the pain worsens  11/24/18 The lymphadenopathy and pain on the right side of her neck have resolved.  She still has about 3 days of doxycycline left.  However 3 days ago, the patient developed rhinorrhea, postnasal drip, head congestion, sore scratchy throat, and some nausea related to postnasal drip and drainage.  She denies any fevers or chills.  She denies any significant cough.  She does report congestion in her chest but is not making her cough.  She denies any chest pain or pleurisy.  She denies any sinus pain.  She denies any headaches.  She denies any otalgia.  Past Medical History:  Diagnosis Date  . Allergy    rhinitis  . Aortic stenosis    mild by echo 06/2017  . Arthritis    . Bradycardia    a. 10/2017 -> beta blocker cut back due to HR 39.  . Breast cancer ([place]) 01/06/2012  . Cancer [facility])    right colon and left breast  . Chronic diastolic CHF (congestive heart failure) ([place])   . Colon cancer ([place]) 01/06/2012  . Colovesical fistula    Dr. Marlou Starks and [name] planning surgery 08/2018  . COPD (chronic obstructive pulmonary disease) ([place])    pt. denies  . Coronary artery disease 2006   a.  NSTEMI in 2016, cath showed 15% prox-mid RCA, 20% prox LAD, EF 25-35% by cath and 35-40% -> felt due to Takotsubo cardiomyopathy.  . Dilated aortic root ([place])    51mmHg by echo 06/2017  . Diverticulosis   . Dyspnea   . Edema extremities   . GERD (gastroesophageal reflux disease)   . Hernia   . Hiatal hernia    denies  . Hyperlipidemia   . Hypertension   . Mild aortic stenosis    echo 11/2015 but not noted on echo 06/2016  . Osteopenia   . Permanent atrial fibrillation    chronic atrial fibrillation  . Pneumonia    hx child  . Pulmonary HTN ([place])    a. moderate to severe PASP 79mmHg echo 11/2015 - now 4mmHg by echo 06/2017. CTA chest in 11/16 with no PE. PFTs in 7/15  with mild obstructive lung disease. She had a negative sleep study in 2017. b. Felt due to left sided HF.  Marland Kitchen Stroke ([place])   . Takotsubo syndrome 07/29/2015   a. EF 35-40% by echo; akinesis of mid-apical anteroseptal and apical myocardium.  EF now normalized on echo 11/2015   Past Surgical History:  Procedure Laterality Date  . APPENDECTOMY    . BREAST SURGERY     lumpectomy left  . CARDIAC CATHETERIZATION    . CARDIAC CATHETERIZATION N/A 07/28/2015   Procedure: Left Heart Cath and Coronary Angiography;  Surgeon: Peter M Martinique, MD;  Location: [place] [facility];  Service: Cardiovascular;  Laterality: N/A;  . CHOLECYSTECTOMY    . COLECTOMY     right side  . CYSTOSCOPY WITH STENT PLACEMENT Left 09/04/2018   Procedure: CYSTOSCOPY WITH LEFT STENT PLACEMENT, BLADDER REPAIR, CYSTOSCOPY WITH LEFT STENT  REMOVAL;  Surgeon: Raynelle Bring, MD;  [facility];  Service: Urology;  Laterality: Left;  . EXCISION OF ACCESSORY NIPPLE Bilateral 05/30/2013   Procedure: BILATERAL NIPPLE BIOPSY;  Surgeon: Merrie Roof, MD;  Location: [place];  Service: General;  Laterality: Bilateral;  . EYE SURGERY Bilateral 12   cataracts  . IR RADIOLOGIST EVAL & MGMT  07/26/2017  . LAPAROSCOPIC RIGHT COLECTOMY N/A 09/04/2018   Procedure: LAPAROSCOPIC ASSISTED SIGMOID COLECTOMY WITH REPAIR OF FISTULA TO BLADDER;  Surgeon: Jovita Kussmaul, MD;  [facility];  Service: General;  Laterality: N/A;  . SPLIT NIGHT STUDY  02/02/2016       Current Outpatient Medications on File Prior to Visit  Medication Sig Dispense Refill  . acetaminophen (TYLENOL) 500 MG tablet Take 500-1,000 mg by mouth every 6 (six) hours as needed for headache (pain).    Marland Kitchen amLODipine (NORVASC) 5 MG tablet Take 1.5 tablets (7.5 mg total) by mouth daily. 135 tablet 3  . apixaban (ELIQUIS) 5 MG TABS tablet Take 1 tablet (5 mg total) by mouth 2 (two) times daily. (Patient taking differently: Take 5 mg by mouth 2 (two) times daily. ) 180 tablet 1  . augmented betamethasone dipropionate (DIPROLENE-AF) 0.05 % ointment Apply 1 application topically 2 (two) times daily as needed (for skin irritation/rashes.).   1  . clonazePAM (KLONOPIN) 0.5 MG tablet Take 1 tablet (0.5 mg total) by mouth 2 (two) times daily as needed (anxiety/sleep.). for anxiety 60 tablet 1  . cloNIDine (CATAPRES) 0.1 MG tablet TAKE 1 TABLET BY MOUTH TWICE DAILY 180 tablet 2  . doxycycline (VIBRA-TABS) 100 MG tablet Take 1 tablet (100 mg total) by mouth 2 (two) times daily. 14 tablet 0  . famotidine (PEPCID) 20 MG tablet One at bedtime 30 tablet 11  . Guaifenesin (MUCINEX MAXIMUM STRENGTH) 1200 MG TB12 Take 1,200 mg by mouth daily as needed (congestion).    Marland Kitchen HYDROcodone-acetaminophen (NORCO/VICODIN) 5-325 MG tablet Take 1-2 tablets by mouth every 6 (six) hours as needed for moderate pain  or severe pain. 15 tablet 0  . losartan (COZAAR) 100 MG tablet TAKE 1 TABLET BY MOUTH ONCE DAILY (Patient taking differently: Take 100 mg by mouth daily. ) 90 tablet 1  . LUBRICANT EYE DROPS 0.4-0.3 % SOLN Place 1 drop into both eyes 3 (three) times daily as needed (for dry/irritated eyes.).    Marland Kitchen metoprolol succinate (TOPROL XL) 25 MG 24 hr tablet Take 1 tablet (25 mg total) by mouth daily. 90 tablet 3  . mirtazapine (REMERON) 15 MG tablet TAKE 1 TABLET BY MOUTH AT BEDTIME (Patient taking differently:  Take 15 mg by mouth at bedtime. ) 90 tablet 0  . nitroGLYCERIN (NITROSTAT) 0.4 MG SL tablet PLACE ONE TABLET UNDER THE TONGUE EVERY FIVE MINUTES AS NEEDED FOR CHEST PAIN. [place] REPEAT FOR THREE DOSES. (Patient taking differently: Place 0.4 mg under the tongue every 5 (five) minutes x 3 doses as needed for chest pain. ) 30 tablet 2  . ondansetron (ZOFRAN ODT) 4 MG disintegrating tablet Take 1 tablet (4 mg total) by mouth every 8 (eight) hours as needed for nausea or vomiting. 20 tablet 0  . pantoprazole (PROTONIX) 40 MG tablet Take 1 tablet (40 mg total) by mouth daily. Take 30-60 min before first meal of the day 30 tablet 2  . spironolactone (ALDACTONE) 25 MG tablet TAKE 1/2 (ONE-HALF) TABLET BY MOUTH ONCE DAILY (Patient taking differently: Take 12.5 mg by mouth daily. ) 45 tablet 3  . torsemide (DEMADEX) 20 MG tablet Take 3 tablets (60 mg total) by mouth daily. 90 tablet 11   No current facility-administered medications on file prior to visit.    Allergies  Allergen Reactions  . Contrast Media [Iodinated Diagnostic Agents] Hives and Other (See Comments)    Per pt strong burning sensation starting in chest radiating outward   . Clonidine Derivatives Other (See Comments)    Throat dry  . Losartan Itching  . Statins Other (See Comments)    "bones hurt"  . Sulfa Antibiotics Diarrhea    Tremors   . Celebrex [Celecoxib] Rash  . Isosorbide Nitrate Itching and Rash  . Other Itching and Rash     Plastic and paper tape and heart monitor pads  . Tape Itching and Rash    Red Where applied and will spread   Social History   Socioeconomic History  . Marital status: Married    Spouse name: Not on file  . Number of children: Not on file  . Years of education: Not on file  . Highest education level: Not on file  Occupational History  . Not on file  Social Needs  . Financial resource strain: Not on file  . Food insecurity:    Worry: Not on file    Inability: Not on file  . Transportation needs:    Medical: Not on file    Non-medical: Not on file  Tobacco Use  . Smoking status: Never Smoker  . Smokeless tobacco: Never Used  Substance and Sexual Activity  . Alcohol use: No  . Drug use: No  . Sexual activity: Not Currently  Lifestyle  . Physical activity:    Days per week: Not on file    Minutes per session: Not on file  . Stress: Not on file  Relationships  . Social connections:    Talks on phone: Not on file    Gets together: Not on file    Attends religious service: Not on file    Active member of club or organization: Not on file    Attends meetings of clubs or organizations: Not on file    Relationship status: Not on file  . Intimate partner violence:    Fear of current or ex partner: Not on file    Emotionally abused: Not on file    Physically abused: Not on file    Forced sexual activity: Not on file  Other Topics Concern  . Not on file  Social History Narrative  . Not on file      Review of Systems  Musculoskeletal: Positive for back pain.  All  other systems reviewed and are negative.      Objective:   Physical Exam  Constitutional: She appears well-developed and well-nourished. No distress.  HENT:  Right Ear: External ear normal.  Left Ear: External ear normal.  Nose: Rhinorrhea present. No mucosal edema.  Mouth/Throat: Oropharynx is clear and moist. No oropharyngeal exudate.  Eyes: Conjunctivae are normal.  Neck: Trachea normal, normal  range of motion and phonation normal. Neck supple. Normal carotid pulses and no JVD present. Carotid bruit is not present. No edema and no erythema present. No thyroid mass present.  Cardiovascular: Normal rate and normal heart sounds.  Pulmonary/Chest: Effort normal and breath sounds normal. No respiratory distress. She has no wheezes. She has no rales. She exhibits no tenderness.  Abdominal: Normal appearance and bowel sounds are normal. She exhibits no distension. There is no abdominal tenderness. There is no rigidity, no rebound and no guarding.  Musculoskeletal:        General: No edema.  Lymphadenopathy:    She has no cervical adenopathy.  Skin: Skin is warm. She is not diaphoretic. No erythema.  Vitals reviewed.         Assessment & Plan:  Symptoms are consistent with an upper respiratory infection versus allergies.  I recommended Coricidin HBP over-the-counter for head congestion and rhinorrhea.  She can take Flonase 2 sprays each nostril daily as well to help with a head congestion.  Recheck if symptoms do not improve over the next week or so.  She also has what appears to be ringworm on her abdomen near her surgical site.  I recommended trying Lotrimin cream twice daily for 10 days to treat this the patches roughly 5 cm in diameter and has a sharp serpiginous red border

## 2018-12-07 ENCOUNTER — Ambulatory Visit: Payer: Medicare Other | Admitting: Internal Medicine

## 2018-12-09 ENCOUNTER — Other Ambulatory Visit: Payer: Self-pay | Admitting: Cardiology

## 2018-12-11 NOTE — Telephone Encounter (Signed)
Pt last saw Dr [name] 09/28/18, last labs 09/29/18 Creat 1.11, age 84, weight 87.1kg, based on specified criteria pt is on appropriate dosage of Eliquis 5mg  BID.  Will refill rx.

## 2018-12-12 ENCOUNTER — Other Ambulatory Visit: Payer: Self-pay | Admitting: *Deleted

## 2018-12-12 NOTE — Telephone Encounter (Signed)
No message attached. I called [facility]. They said the Rx is ready. Might have been calling to see if patient wanted it shipped to them. But not sure why they would have called the Dr. Gabriel Carina. Not other notes on Rx and Rx for eliquis was filled.

## 2019-01-30 ENCOUNTER — Other Ambulatory Visit: Payer: Self-pay

## 2019-01-30 ENCOUNTER — Ambulatory Visit: Payer: Medicare Other | Admitting: Family Medicine

## 2019-02-01 ENCOUNTER — Other Ambulatory Visit: Payer: Self-pay

## 2019-02-01 ENCOUNTER — Ambulatory Visit (INDEPENDENT_AMBULATORY_CARE_PROVIDER_SITE_OTHER): Payer: Medicare Other | Admitting: Family Medicine

## 2019-02-01 ENCOUNTER — Encounter: Payer: Self-pay | Admitting: Family Medicine

## 2019-02-01 VITALS — BP 122/58 | HR 58 | Temp 98.0°F | Resp 18 | Ht 67.0 in | Wt 190.0 lb

## 2019-02-01 DIAGNOSIS — R55 Syncope and collapse: Secondary | ICD-10-CM | POA: Diagnosis not present

## 2019-02-01 NOTE — Progress Notes (Signed)
Subjective:    Patient ID: [name], female    DOB: 06/20/1935, 84 y.o.   MRN: [id number]  Patient initially complained of a dry scratchy throat and drainage going down her throat that she thought was due to allergies.  However when I came in the room, she also mention feeling "sick" in the center of her chest.  When asked the patient to describe more, she reports episodic feelings of weakness like she [place] pass out.  She states that she suddenly feels sick and weak and lightheaded.  She has yet to faint.  She states that occurs 2-3 times a day.  It is not provoked by any activity.  There are no alleviating factors.  It comes and goes spontaneously.  She denies any palpitations however on exam, her heart rate is bradycardic.  I counted her heart rate to be in the mid 40s while examining her.  Obviously she has an irregularly irregular rhythm.  EKG today shows A. fib with an average heart rate of 51 bpm but no obvious acute ischemic changes.  Patient denies any chest pain.  She denies any shortness of breath.  She denies any orthopnea or paroxysmal nocturnal dyspnea.  She denies any cough or hemoptysis or fever or chills.  She denies any nausea or vomiting or diarrhea.  As best I can tell based on her history and her review of systems, the "sick" sensation that she discusses is more a near syncopal episode  Past Medical History:  Diagnosis Date  . Allergy    rhinitis  . Aortic stenosis    mild by echo 06/2017  . Arthritis   . Bradycardia    a. 10/2017 -> beta blocker cut back due to HR 39.  . Breast cancer ([place]) 01/06/2012  . Cancer [facility])    right colon and left breast  . Chronic diastolic CHF (congestive heart failure) ([place])   . Colon cancer ([place]) 01/06/2012  . Colovesical fistula    Dr. Marlou Starks and [name] planning surgery 08/2018  . COPD (chronic obstructive pulmonary disease) ([place])    pt. denies  . Coronary artery disease 2006   a.  NSTEMI in 2016, cath showed 15% prox-mid RCA, 20% prox LAD,  EF 25-35% by cath and 35-40% -> felt due to Takotsubo cardiomyopathy.  . Dilated aortic root ([place])    29mmHg by echo 06/2017  . Diverticulosis   . Dyspnea   . Edema extremities   . GERD (gastroesophageal reflux disease)   . Hernia   . Hiatal hernia    denies  . Hyperlipidemia   . Hypertension   . Mild aortic stenosis    echo 11/2015 but not noted on echo 06/2016  . Osteopenia   . Permanent atrial fibrillation    chronic atrial fibrillation  . Pneumonia    hx child  . Pulmonary HTN ([place])    a. moderate to severe PASP 37mmHg echo 11/2015 - now 82mmHg by echo 06/2017. CTA chest in 11/16 with no PE. PFTs in 7/15 with mild obstructive lung disease. She had a negative sleep study in 2017. b. Felt due to left sided HF.  Marland Kitchen Stroke ([place])   . Takotsubo syndrome 07/29/2015   a. EF 35-40% by echo; akinesis of mid-apical anteroseptal and apical myocardium.  EF now normalized on echo 11/2015   Past Surgical History:  Procedure Laterality Date  . APPENDECTOMY    . BREAST SURGERY     lumpectomy left  . CARDIAC CATHETERIZATION    .  CARDIAC CATHETERIZATION N/A 07/28/2015   Procedure: Left Heart Cath and Coronary Angiography;  Surgeon: Peter M Martinique, MD;  Location: [place] [facility];  Service: Cardiovascular;  Laterality: N/A;  . CHOLECYSTECTOMY    . COLECTOMY     right side  . CYSTOSCOPY WITH STENT PLACEMENT Left 09/04/2018   Procedure: CYSTOSCOPY WITH LEFT STENT PLACEMENT, BLADDER REPAIR, CYSTOSCOPY WITH LEFT STENT REMOVAL;  Surgeon: Raynelle Bring, MD;  [facility];  Service: Urology;  Laterality: Left;  . EXCISION OF ACCESSORY NIPPLE Bilateral 05/30/2013   Procedure: BILATERAL NIPPLE BIOPSY;  Surgeon: Merrie Roof, MD;  Location: [place];  Service: General;  Laterality: Bilateral;  . EYE SURGERY Bilateral 12   cataracts  . IR RADIOLOGIST EVAL & MGMT  07/26/2017  . LAPAROSCOPIC RIGHT COLECTOMY N/A 09/04/2018   Procedure: LAPAROSCOPIC ASSISTED SIGMOID COLECTOMY WITH REPAIR OF FISTULA TO  BLADDER;  Surgeon: Jovita Kussmaul, MD;  [facility];  Service: General;  Laterality: N/A;  . SPLIT NIGHT STUDY  02/02/2016       Current Outpatient Medications on File Prior to Visit  Medication Sig Dispense Refill  . acetaminophen (TYLENOL) 500 MG tablet Take 500-1,000 mg by mouth every 6 (six) hours as needed for headache (pain).    Marland Kitchen amLODipine (NORVASC) 5 MG tablet Take 1.5 tablets (7.5 mg total) by mouth daily. 135 tablet 3  . augmented betamethasone dipropionate (DIPROLENE-AF) 0.05 % ointment Apply 1 application topically 2 (two) times daily as needed (for skin irritation/rashes.).   1  . clonazePAM (KLONOPIN) 0.5 MG tablet Take 1 tablet (0.5 mg total) by mouth 2 (two) times daily as needed (anxiety/sleep.). for anxiety 60 tablet 1  . cloNIDine (CATAPRES) 0.1 MG tablet TAKE 1 TABLET BY MOUTH TWICE DAILY 180 tablet 2  . clotrimazole (LOTRIMIN) 1 % cream Apply 1 application topically 2 (two) times daily. 30 g 0  . doxycycline (VIBRA-TABS) 100 MG tablet Take 1 tablet (100 mg total) by mouth 2 (two) times daily. 14 tablet 0  . ELIQUIS 5 MG TABS tablet Take 1 tablet by mouth twice daily 180 tablet 2  . famotidine (PEPCID) 20 MG tablet One at bedtime 30 tablet 11  . Guaifenesin (MUCINEX MAXIMUM STRENGTH) 1200 MG TB12 Take 1,200 mg by mouth daily as needed (congestion).    Marland Kitchen HYDROcodone-acetaminophen (NORCO/VICODIN) 5-325 MG tablet Take 1-2 tablets by mouth every 6 (six) hours as needed for moderate pain or severe pain. 15 tablet 0  . losartan (COZAAR) 100 MG tablet TAKE 1 TABLET BY MOUTH ONCE DAILY (Patient taking differently: Take 100 mg by mouth daily. ) 90 tablet 1  . LUBRICANT EYE DROPS 0.4-0.3 % SOLN Place 1 drop into both eyes 3 (three) times daily as needed (for dry/irritated eyes.).    Marland Kitchen metoprolol succinate (TOPROL XL) 25 MG 24 hr tablet Take 1 tablet (25 mg total) by mouth daily. 90 tablet 3  . mirtazapine (REMERON) 15 MG tablet TAKE 1 TABLET BY MOUTH AT BEDTIME (Patient taking  differently: Take 15 mg by mouth at bedtime. ) 90 tablet 0  . nitroGLYCERIN (NITROSTAT) 0.4 MG SL tablet PLACE ONE TABLET UNDER THE TONGUE EVERY FIVE MINUTES AS NEEDED FOR CHEST PAIN. [place] REPEAT FOR THREE DOSES. (Patient taking differently: Place 0.4 mg under the tongue every 5 (five) minutes x 3 doses as needed for chest pain. ) 30 tablet 2  . ondansetron (ZOFRAN ODT) 4 MG disintegrating tablet Take 1 tablet (4 mg total) by mouth every 8 (eight) hours  as needed for nausea or vomiting. 20 tablet 0  . pantoprazole (PROTONIX) 40 MG tablet Take 1 tablet (40 mg total) by mouth daily. Take 30-60 min before first meal of the day 30 tablet 2  . spironolactone (ALDACTONE) 25 MG tablet Take 1/2 (one-half) tablet by mouth once daily 45 tablet 2  . torsemide (DEMADEX) 20 MG tablet Take 3 tablets (60 mg total) by mouth daily. 90 tablet 11   No current facility-administered medications on file prior to visit.    Allergies  Allergen Reactions  . Contrast Media [Iodinated Diagnostic Agents] Hives and Other (See Comments)    Per pt strong burning sensation starting in chest radiating outward   . Clonidine Derivatives Other (See Comments)    Throat dry  . Losartan Itching  . Statins Other (See Comments)    "bones hurt"  . Sulfa Antibiotics Diarrhea    Tremors   . Celebrex [Celecoxib] Rash  . Isosorbide Nitrate Itching and Rash  . Other Itching and Rash    Plastic and paper tape and heart monitor pads  . Tape Itching and Rash    Red Where applied and will spread   Social History   Socioeconomic History  . Marital status: Married    Spouse name: Not on file  . Number of children: Not on file  . Years of education: Not on file  . Highest education level: Not on file  Occupational History  . Not on file  Social Needs  . Financial resource strain: Not on file  . Food insecurity:    Worry: Not on file    Inability: Not on file  . Transportation needs:    Medical: Not on file    Non-medical: Not  on file  Tobacco Use  . Smoking status: Never Smoker  . Smokeless tobacco: Never Used  Substance and Sexual Activity  . Alcohol use: No  . Drug use: No  . Sexual activity: Not Currently  Lifestyle  . Physical activity:    Days per week: Not on file    Minutes per session: Not on file  . Stress: Not on file  Relationships  . Social connections:    Talks on phone: Not on file    Gets together: Not on file    Attends religious service: Not on file    Active member of club or organization: Not on file    Attends meetings of clubs or organizations: Not on file    Relationship status: Not on file  . Intimate partner violence:    Fear of current or ex partner: Not on file    Emotionally abused: Not on file    Physically abused: Not on file    Forced sexual activity: Not on file  Other Topics Concern  . Not on file  Social History Narrative  . Not on file      Review of Systems  Musculoskeletal: Positive for back pain.  All other systems reviewed and are negative.      Objective:   Physical Exam  Constitutional: She appears well-developed and well-nourished. No distress.  HENT:  Right Ear: External ear normal.  Left Ear: External ear normal.  Nose: Rhinorrhea present. No mucosal edema.  Mouth/Throat: Oropharynx is clear and moist. No oropharyngeal exudate.  Eyes: Conjunctivae are normal.  Neck: Trachea normal, normal range of motion and phonation normal. Neck supple. Normal carotid pulses and no JVD present. Carotid bruit is not present. No edema and no erythema present. No thyroid  mass present.  Cardiovascular: Normal heart sounds. An irregularly irregular rhythm present. Bradycardia present.  Pulmonary/Chest: Effort normal and breath sounds normal. No respiratory distress. She has no wheezes. She has no rales. She exhibits no tenderness.  Abdominal: Normal appearance and bowel sounds are normal. She exhibits no distension. There is no abdominal tenderness. There is no  rigidity, no rebound and no guarding.  Musculoskeletal:        General: No edema.  Lymphadenopathy:    She has no cervical adenopathy.  Skin: Skin is warm. She is not diaphoretic. No erythema.  Vitals reviewed.         Assessment & Plan:  Near syncope - Plan: EKG 12-Lead  I believe her "sick" episodes are actually near syncope due to bradycardia and cerebral hypoperfusion.  Patient has atrial fibrillation however I believe her bradycardia [place] be more likely due to medication.  Therefore I recommended that she discontinue metoprolol and I would like to see the patient back in 1 week to see if her symptoms have improved.  If not at that time, I would recommend a cardiac event monitor/Holter monitor to evaluate for any other underlying cardiac arrhythmias.  Defer treatment to her next visit for the postnasal drip and dry scratchy throat as I want to minimize possible medication side effects until I am certain of the cause of her unusual/hard to describe sick feeling

## 2019-02-02 DIAGNOSIS — R3121 Asymptomatic microscopic hematuria: Secondary | ICD-10-CM | POA: Diagnosis not present

## 2019-02-02 DIAGNOSIS — N321 Vesicointestinal fistula: Secondary | ICD-10-CM | POA: Diagnosis not present

## 2019-02-12 ENCOUNTER — Other Ambulatory Visit: Payer: Self-pay | Admitting: Family Medicine

## 2019-02-12 ENCOUNTER — Other Ambulatory Visit: Payer: Self-pay | Admitting: Cardiology

## 2019-02-12 ENCOUNTER — Other Ambulatory Visit (HOSPITAL_COMMUNITY): Payer: Self-pay | Admitting: Cardiology

## 2019-02-12 ENCOUNTER — Encounter: Payer: Self-pay | Admitting: Family Medicine

## 2019-02-12 ENCOUNTER — Ambulatory Visit (INDEPENDENT_AMBULATORY_CARE_PROVIDER_SITE_OTHER): Payer: Medicare Other | Admitting: Family Medicine

## 2019-02-12 ENCOUNTER — Other Ambulatory Visit: Payer: Self-pay

## 2019-02-12 VITALS — BP 120/48 | HR 60 | Temp 98.6°F | Resp 14 | Ht 67.0 in | Wt 189.0 lb

## 2019-02-12 DIAGNOSIS — R55 Syncope and collapse: Secondary | ICD-10-CM

## 2019-02-12 MED ORDER — LOSARTAN POTASSIUM 100 MG PO TABS: 100.0000 mg | ORAL_TABLET | Freq: Every day | ORAL | 1 refills | Status: DC

## 2019-02-12 NOTE — Progress Notes (Signed)
Subjective:    Patient ID: [name], female    DOB: [birth year], 84 y.o.   MRN: [id number]  02/01/19 Patient initially complained of a dry scratchy throat and drainage going down her throat that she thought was due to allergies.  However when I came in the room, she also mention feeling "sick" in the center of her chest.  When asked the patient to describe more, she reports episodic feelings of weakness like she Woodrick pass out.  She states that she suddenly feels sick and weak and lightheaded.  She has yet to faint.  She states that occurs 2-3 times a day.  It is not provoked by any activity.  There are no alleviating factors.  It comes and goes spontaneously.  She denies any palpitations however on exam, her heart rate is bradycardic.  I counted her heart rate to be in the mid 40s while examining her.  Obviously she has an irregularly irregular rhythm.  EKG today shows A. fib with an average heart rate of 51 bpm but no obvious acute ischemic changes.  Patient denies any chest pain.  She denies any shortness of breath.  She denies any orthopnea or paroxysmal nocturnal dyspnea.  She denies any cough or hemoptysis or fever or chills.  She denies any nausea or vomiting or diarrhea.  As best I can tell based on her history and her review of systems, the "sick" sensation that she discusses is more a near syncopal episode.  At that time, my plan was: I believe her "sick" episodes are actually near syncope due to bradycardia and cerebral hypoperfusion.  Patient has atrial fibrillation however I believe her bradycardia Gedeon be more likely due to medication.  Therefore I recommended that she discontinue metoprolol and I would like to see the patient back in 1 week to see if her symptoms have improved.  If not at that time, I would recommend a cardiac event monitor/Holter monitor to evaluate for any other underlying cardiac arrhythmias.  Defer treatment to her next visit for the postnasal drip and dry scratchy throat as I  want to minimize possible medication side effects until I am certain of the cause of her unusual/hard to describe sick feeling  02/12/19 Patient states that she feels much better since discontinuing her beta-blocker.  She continues to occasionally have "sick spells" where she feels lightheaded but they are occurring much more infrequently and with less severity.  Her heart rate today is in normal range between 60 and 65 bpm on physical exam.  Her blood pressure is also adequately controlled despite discontinuing the metoprolol.  She denies any palpitations.  She denies any shortness of breath or chest pain.  She states that she feels much better.  She does occasionally report postnasal drip however this is also improved since her last visit.  She is not taking her Flonase or Claritin every day.  I have recommended that she do that if the dry scratchy throat postnasal drip worsens.  Past Medical History:  Diagnosis Date  . Allergy    rhinitis  . Aortic stenosis    mild by echo 06/2017  . Arthritis   . Bradycardia    a. 10/2017 -> beta blocker cut back due to HR 39.  . Breast cancer ([place]) 01/06/2012  . Cancer [facility])    right colon and left breast  . Chronic diastolic CHF (congestive heart failure) ([place])   . Colon cancer ([place]) 01/06/2012  . Colovesical fistula  Dr. Marlou Starks and [name] planning surgery 08/2018- surgery revealed spontaneous closure  . COPD (chronic obstructive pulmonary disease) ([place])    pt. denies  . Coronary artery disease 2006   a.  NSTEMI in 2016, cath showed 15% prox-mid RCA, 20% prox LAD, EF 25-35% by cath and 35-40% -> felt due to Takotsubo cardiomyopathy.  . Dilated aortic root ([place])    59mmHg by echo 06/2017  . Diverticulosis   . Dyspnea   . Edema extremities   . GERD (gastroesophageal reflux disease)   . Hernia   . Hiatal hernia    denies  . Hyperlipidemia   . Hypertension   . Mild aortic stenosis    echo 11/2015 but not noted on echo 06/2016  . Osteopenia   .  Permanent atrial fibrillation    chronic atrial fibrillation  . Pneumonia    hx child  . Pulmonary HTN ([place])    a. moderate to severe PASP 29mmHg echo 11/2015 - now 24mmHg by echo 06/2017. CTA chest in 11/16 with no PE. PFTs in 7/15 with mild obstructive lung disease. She had a negative sleep study in 2017. b. Felt due to left sided HF.  Marland Kitchen Stroke ([place])   . Takotsubo syndrome 07/29/2015   a. EF 35-40% by echo; akinesis of mid-apical anteroseptal and apical myocardium.  EF now normalized on echo 11/2015   Past Surgical History:  Procedure Laterality Date  . APPENDECTOMY    . BREAST SURGERY     lumpectomy left  . CARDIAC CATHETERIZATION    . CARDIAC CATHETERIZATION N/A 07/28/2015   Procedure: Left Heart Cath and Coronary Angiography;  Surgeon: Peter M Martinique, MD;  Location: [facility];  Service: Cardiovascular;  Laterality: N/A;  . CHOLECYSTECTOMY    . COLECTOMY     right side  . CYSTOSCOPY WITH STENT PLACEMENT Left 09/04/2018   Procedure: CYSTOSCOPY WITH LEFT STENT PLACEMENT, BLADDER REPAIR, CYSTOSCOPY WITH LEFT STENT REMOVAL;  Surgeon: Raynelle Bring, MD;  Location: [facility];  Service: Urology;  Laterality: Left;  . EXCISION OF ACCESSORY NIPPLE Bilateral 05/30/2013   Procedure: BILATERAL NIPPLE BIOPSY;  Surgeon: Merrie Roof, MD;  Location: [facility];  Service: General;  Laterality: Bilateral;  . EYE SURGERY Bilateral 12   cataracts  . IR RADIOLOGIST EVAL & MGMT  07/26/2017  . LAPAROSCOPIC RIGHT COLECTOMY N/A 09/04/2018   Procedure: LAPAROSCOPIC ASSISTED SIGMOID COLECTOMY WITH REPAIR OF FISTULA TO BLADDER;  Surgeon: Jovita Kussmaul, MD;  Location: [facility];  Service: General;  Laterality: N/A;  . SPLIT NIGHT STUDY  02/02/2016       Current Outpatient Medications on File Prior to Visit  Medication Sig Dispense Refill  . acetaminophen (TYLENOL) 500 MG tablet Take 500-1,000 mg by mouth every 6 (six) hours as needed for headache (pain).    Marland Kitchen amLODipine (NORVASC) 5 MG tablet Take 1.5  tablets (7.5 mg total) by mouth daily. 135 tablet 3  . augmented betamethasone dipropionate (DIPROLENE-AF) 0.05 % ointment Apply 1 application topically 2 (two) times daily as needed (for skin irritation/rashes.).   1  . clonazePAM (KLONOPIN) 0.5 MG tablet Take 1 tablet (0.5 mg total) by mouth 2 (two) times daily as needed (anxiety/sleep.). for anxiety 60 tablet 1  . cloNIDine (CATAPRES) 0.1 MG tablet TAKE 1 TABLET BY MOUTH TWICE DAILY 180 tablet 2  . ELIQUIS 5 MG TABS tablet Take 1 tablet by mouth twice daily 180 tablet 2  . famotidine (PEPCID) 20 MG tablet One at bedtime 30  tablet 11  . Guaifenesin (MUCINEX MAXIMUM STRENGTH) 1200 MG TB12 Take 1,200 mg by mouth daily as needed (congestion).    Marland Kitchen HYDROcodone-acetaminophen (NORCO/VICODIN) 5-325 MG tablet Take 1-2 tablets by mouth every 6 (six) hours as needed for moderate pain or severe pain. 15 tablet 0  . losartan (COZAAR) 100 MG tablet TAKE 1 TABLET BY MOUTH ONCE DAILY (Patient taking differently: Take 100 mg by mouth daily. ) 90 tablet 1  . LUBRICANT EYE DROPS 0.4-0.3 % SOLN Place 1 drop into both eyes 3 (three) times daily as needed (for dry/irritated eyes.).    Marland Kitchen mirtazapine (REMERON) 15 MG tablet TAKE 1 TABLET BY MOUTH AT BEDTIME (Patient taking differently: Take 15 mg by mouth at bedtime. ) 90 tablet 0  . nitroGLYCERIN (NITROSTAT) 0.4 MG SL tablet PLACE ONE TABLET UNDER THE TONGUE EVERY FIVE MINUTES AS NEEDED FOR CHEST PAIN. Comes REPEAT FOR THREE DOSES. (Patient taking differently: Place 0.4 mg under the tongue every 5 (five) minutes x 3 doses as needed for chest pain. ) 30 tablet 2  . ondansetron (ZOFRAN ODT) 4 MG disintegrating tablet Take 1 tablet (4 mg total) by mouth every 8 (eight) hours as needed for nausea or vomiting. 20 tablet 0  . pantoprazole (PROTONIX) 40 MG tablet Take 1 tablet (40 mg total) by mouth daily. Take 30-60 min before first meal of the day 30 tablet 2  . spironolactone (ALDACTONE) 25 MG tablet Take 1/2 (one-half) tablet  by mouth once daily 45 tablet 2  . torsemide (DEMADEX) 20 MG tablet Take 3 tablets (60 mg total) by mouth daily. 90 tablet 11   No current facility-administered medications on file prior to visit.    Allergies  Allergen Reactions  . Contrast Media [Iodinated Diagnostic Agents] Hives and Other (See Comments)    Per pt strong burning sensation starting in chest radiating outward   . Clonidine Derivatives Other (See Comments)    Throat dry  . Losartan Itching  . Statins Other (See Comments)    "bones hurt"  . Sulfa Antibiotics Diarrhea    Tremors   . Celebrex [Celecoxib] Rash  . Isosorbide Nitrate Itching and Rash  . Other Itching and Rash    Plastic and paper tape and heart monitor pads  . Tape Itching and Rash    Red Where applied and will spread   Social History   Socioeconomic History  . Marital status: Married    Spouse name: Not on file  . Number of children: Not on file  . Years of education: Not on file  . Highest education level: Not on file  Occupational History  . Not on file  Social Needs  . Financial resource strain: Not on file  . Food insecurity:    Worry: Not on file    Inability: Not on file  . Transportation needs:    Medical: Not on file    Non-medical: Not on file  Tobacco Use  . Smoking status: Never Smoker  . Smokeless tobacco: Never Used  Substance and Sexual Activity  . Alcohol use: No  . Drug use: No  . Sexual activity: Not Currently  Lifestyle  . Physical activity:    Days per week: Not on file    Minutes per session: Not on file  . Stress: Not on file  Relationships  . Social connections:    Talks on phone: Not on file    Gets together: Not on file    Attends religious service: Not on file  Active member of club or organization: Not on file    Attends meetings of clubs or organizations: Not on file    Relationship status: Not on file  . Intimate partner violence:    Fear of current or ex partner: Not on file    Emotionally  abused: Not on file    Physically abused: Not on file    Forced sexual activity: Not on file  Other Topics Concern  . Not on file  Social History Narrative  . Not on file      Review of Systems  Musculoskeletal: Positive for back pain.  All other systems reviewed and are negative.      Objective:   Physical Exam  Constitutional: She appears well-developed and well-nourished. No distress.  HENT:  Right Ear: External ear normal.  Left Ear: External ear normal.  Nose: Rhinorrhea present. No mucosal edema.  Mouth/Throat: Oropharynx is clear and moist. No oropharyngeal exudate.  Eyes: Conjunctivae are normal.  Neck: Trachea normal, normal range of motion and phonation normal. Neck supple. Normal carotid pulses and no JVD present. Carotid bruit is not present. No edema and no erythema present. No thyroid mass present.  Cardiovascular: Normal rate and normal heart sounds. An irregularly irregular rhythm present.  Pulmonary/Chest: Effort normal and breath sounds normal. No respiratory distress. She has no wheezes. She has no rales. She exhibits no tenderness.  Abdominal: Normal appearance and bowel sounds are normal. She exhibits no distension. There is no abdominal tenderness. There is no rigidity, no rebound and no guarding.  Musculoskeletal:        General: No edema.  Lymphadenopathy:    She has no cervical adenopathy.  Skin: Skin is warm. She is not diaphoretic. No erythema.  Vitals reviewed.         Assessment & Plan:  Near syncope  I believe her near syncopal episodes were due to bradycardia due to medication.  Patient symptomatically feels much better since discontinuing metoprolol.  We will continue the patient off metoprolol for the present time.  If she develops tachycardia or palpitation she will call me immediately.  If the lightheadedness or near syncopal episodes worsen, I would recommend an event monitor to evaluate for any other underlying cardiac arrhythmias or  to determine if the patient Abrahamsen have underlying sick sinus syndrome.  However the present time, the patient feels much better and no additional changes need to be made

## 2019-02-12 NOTE — Telephone Encounter (Signed)
Pt calling requesting a refill on Losartan 100 mg tablet. Dr. Aundra Dubin prescribed this medication and pt stated that she has not seen Dr. Aundra Dubin since 2018 and pt would like to know if Dr. [name] would refill this medication. Please address

## 2019-02-27 ENCOUNTER — Ambulatory Visit
Admission: RE | Admit: 2019-02-27 | Discharge: 2019-02-27 | Disposition: A | Discharge status: Home / Self Care | Payer: Medicare Other | Source: Ambulatory Visit | Attending: Family Medicine | Admitting: Family Medicine

## 2019-02-27 ENCOUNTER — Ambulatory Visit (INDEPENDENT_AMBULATORY_CARE_PROVIDER_SITE_OTHER): Payer: Medicare Other | Admitting: Family Medicine

## 2019-02-27 ENCOUNTER — Other Ambulatory Visit: Payer: Self-pay | Admitting: Family Medicine

## 2019-02-27 ENCOUNTER — Other Ambulatory Visit: Payer: Self-pay

## 2019-02-27 VITALS — BP 116/58 | HR 60 | Temp 98.3°F | Resp 16 | Ht 67.0 in | Wt 184.0 lb

## 2019-02-27 DIAGNOSIS — R55 Syncope and collapse: Secondary | ICD-10-CM

## 2019-02-27 DIAGNOSIS — R0781 Pleurodynia: Secondary | ICD-10-CM

## 2019-02-27 NOTE — Progress Notes (Signed)
Subjective:    Patient ID: [name], female    DOB: September 02, 1935, 84 y.o.   MRN: [id number]  02/01/19 Patient initially complained of a dry scratchy throat and drainage going down her throat that she thought was due to allergies.  However when I came in the room, she also mention feeling "sick" in the center of her chest.  When asked the patient to describe more, she reports episodic feelings of weakness like she Sagar pass out.  She states that she suddenly feels sick and weak and lightheaded.  She has yet to faint.  She states that occurs 2-3 times a day.  It is not provoked by any activity.  There are no alleviating factors.  It comes and goes spontaneously.  She denies any palpitations however on exam, her heart rate is bradycardic.  I counted her heart rate to be in the mid 40s while examining her.  Obviously she has an irregularly irregular rhythm.  EKG today shows A. fib with an average heart rate of 51 bpm but no obvious acute ischemic changes.  Patient denies any chest pain.  She denies any shortness of breath.  She denies any orthopnea or paroxysmal nocturnal dyspnea.  She denies any cough or hemoptysis or fever or chills.  She denies any nausea or vomiting or diarrhea.  As best I can tell based on her history and her review of systems, the "sick" sensation that she discusses is more a near syncopal episode.  At that time, my plan was: I believe her "sick" episodes are actually near syncope due to bradycardia and cerebral hypoperfusion.  Patient has atrial fibrillation however I believe her bradycardia Lofton be more likely due to medication.  Therefore I recommended that she discontinue metoprolol and I would like to see the patient back in 1 week to see if her symptoms have improved.  If not at that time, I would recommend a cardiac event monitor/Holter monitor to evaluate for any other underlying cardiac arrhythmias.  Defer treatment to her next visit for the postnasal drip and dry scratchy throat as I  want to minimize possible medication side effects until I am certain of the cause of her unusual/hard to describe sick feeling  02/12/19 Patient states that she feels much better since discontinuing her beta-blocker.  She continues to occasionally have "sick spells" where she feels lightheaded but they are occurring much more infrequently and with less severity.  Her heart rate today is in normal range between 60 and 65 bpm on physical exam.  Her blood pressure is also adequately controlled despite discontinuing the metoprolol.  She denies any palpitations.  She denies any shortness of breath or chest pain.  She states that she feels much better.  She does occasionally report postnasal drip however this is also improved since her last visit.  She is not taking her Flonase or Claritin every day.  I have recommended that she do that if the dry scratchy throat postnasal drip worsens.  At that time, my plan was: I believe her near syncopal episodes were due to bradycardia due to medication.  Patient symptomatically feels much better since discontinuing metoprolol.  We will continue the patient off metoprolol for the present time.  If she develops tachycardia or palpitation she will call me immediately.  If the lightheadedness or near syncopal episodes worsen, I would recommend an event monitor to evaluate for any other underlying cardiac arrhythmias or to determine if the patient Hamil have underlying sick sinus  syndrome.  However the present time, the patient feels much better and no additional changes need to be made  02/27/19 Patient continues to have episodes of "sickness".  When I question the patient the extent of her symptoms, she states that she feels like she is going to suddenly become weak and passed out.  The spells occur randomly and without warning.  She denies any chest pain.  She denies any shortness of breath.  Symptoms have not improved despite reducing her beta-blocker and discontinuing it.  She  also complains of pain in her right posterior ribs.  The pain is located in her right flank roughly at the level of T10.  She is tender to palpation in that area all the way from the also vertebral angle to the mid axillary line along the body of the 10th and 11th ribs.  She denies any cough.  She denies any shortness of breath.  She denies any pleurisy.  She denies any nausea or vomiting.  She denies any hematemesis.  She denies any fall or injury that could have injured her ribs.  The pain began in the last few days.  It is made worse by movement or walking or palpation.  Past Medical History:  Diagnosis Date   Allergy    rhinitis   Aortic stenosis    mild by echo 06/2017   Arthritis    Bradycardia    a. 10/2017 -> beta blocker cut back due to HR 39.   Breast cancer ([place]) 01/06/2012   Cancer ([place])    right colon and left breast   Chronic diastolic CHF (congestive heart failure) ([place])    Colon cancer ([place]) 01/06/2012   Colovesical fistula    Dr. Marlou Starks and [name] planning surgery 08/2018- surgery revealed spontaneous closure   COPD (chronic obstructive pulmonary disease) ([place])    pt. denies   Coronary artery disease 2006   a.  NSTEMI in 2016, cath showed 15% prox-mid RCA, 20% prox LAD, EF 25-35% by cath and 35-40% -> felt due to Takotsubo cardiomyopathy.   Dilated aortic root ([place])    54mmHg by echo 06/2017   Diverticulosis    Dyspnea    Edema extremities    GERD (gastroesophageal reflux disease)    Hernia    Hiatal hernia    denies   Hyperlipidemia    Hypertension    Mild aortic stenosis    echo 11/2015 but not noted on echo 06/2016   Osteopenia    Permanent atrial fibrillation    chronic atrial fibrillation   Pneumonia    hx child   Pulmonary HTN ([place])    a. moderate to severe PASP 61mmHg echo 11/2015 - now 70mmHg by echo 06/2017. CTA chest in 11/16 with no PE. PFTs in 7/15 with mild obstructive lung disease. She had a negative sleep study in 2017. b.  Felt due to left sided HF.   [facility]    Takotsubo syndrome 07/29/2015   a. EF 35-40% by echo; akinesis of mid-apical anteroseptal and apical myocardium.  EF now normalized on echo 11/2015   Past Surgical History:  Procedure Laterality Date   APPENDECTOMY     BREAST SURGERY     lumpectomy left   CARDIAC CATHETERIZATION     CARDIAC CATHETERIZATION N/A 07/28/2015   Procedure: Left Heart Cath and Coronary Angiography;  Surgeon: Peter M Martinique, MD;  Location: [facility];  Service: Cardiovascular;  Laterality: N/A;   CHOLECYSTECTOMY     COLECTOMY  right side   CYSTOSCOPY WITH STENT PLACEMENT Left 09/04/2018   Procedure: CYSTOSCOPY WITH LEFT STENT PLACEMENT, BLADDER REPAIR, CYSTOSCOPY WITH LEFT STENT REMOVAL;  Surgeon: Raynelle Bring, MD;  Location: [facility];  Service: Urology;  Laterality: Left;   EXCISION OF ACCESSORY NIPPLE Bilateral 05/30/2013   Procedure: BILATERAL NIPPLE BIOPSY;  Surgeon: Merrie Roof, MD;  Location: [facility];  Service: General;  Laterality: Bilateral;   EYE SURGERY Bilateral 12   cataracts   IR RADIOLOGIST EVAL & MGMT  07/26/2017   LAPAROSCOPIC RIGHT COLECTOMY N/A 09/04/2018   Procedure: LAPAROSCOPIC ASSISTED SIGMOID COLECTOMY WITH REPAIR OF FISTULA TO BLADDER;  Surgeon: Autumn Messing III, MD;  Location: [facility];  Service: General;  Laterality: N/A;   SPLIT NIGHT STUDY  02/02/2016       Current Outpatient Medications on File Prior to Visit  Medication Sig Dispense Refill   acetaminophen (TYLENOL) 500 MG tablet Take 500-1,000 mg by mouth every 6 (six) hours as needed for headache (pain).     amLODipine (NORVASC) 5 MG tablet Take 1.5 tablets (7.5 mg total) by mouth daily. 135 tablet 3   augmented betamethasone dipropionate (DIPROLENE-AF) 0.05 % ointment Apply 1 application topically 2 (two) times daily as needed (for skin irritation/rashes.).   1   clonazePAM (KLONOPIN) 0.5 MG tablet Take 1 tablet (0.5 mg total) by mouth 2 (two) times daily as  needed (anxiety/sleep.). for anxiety 60 tablet 1   cloNIDine (CATAPRES) 0.1 MG tablet TAKE 1 TABLET BY MOUTH TWICE DAILY 180 tablet 2   ELIQUIS 5 MG TABS tablet Take 1 tablet by mouth twice daily 180 tablet 2   famotidine (PEPCID) 20 MG tablet One at bedtime 30 tablet 11   Guaifenesin (MUCINEX MAXIMUM STRENGTH) 1200 MG TB12 Take 1,200 mg by mouth daily as needed (congestion).     HYDROcodone-acetaminophen (NORCO/VICODIN) 5-325 MG tablet Take 1-2 tablets by mouth every 6 (six) hours as needed for moderate pain or severe pain. 15 tablet 0   losartan (COZAAR) 100 MG tablet Take 1 tablet (100 mg total) by mouth daily. 90 tablet 1   LUBRICANT EYE DROPS 0.4-0.3 % SOLN Place 1 drop into both eyes 3 (three) times daily as needed (for dry/irritated eyes.).     mirtazapine (REMERON) 15 MG tablet TAKE 1 TABLET BY MOUTH AT BEDTIME (Patient taking differently: Take 15 mg by mouth at bedtime. ) 90 tablet 0   nitroGLYCERIN (NITROSTAT) 0.4 MG SL tablet DISSOLVE ONE TABLET UNDER THE TONGUE EVERY 5 MINUTES AS NEEDED FOR CHEST PAIN.  DO NOT EXCEED A TOTAL OF 3 DOSES IN 15 MINUTES 25 tablet 0   ondansetron (ZOFRAN ODT) 4 MG disintegrating tablet Take 1 tablet (4 mg total) by mouth every 8 (eight) hours as needed for nausea or vomiting. 20 tablet 0   pantoprazole (PROTONIX) 40 MG tablet Take 1 tablet (40 mg total) by mouth daily. Take 30-60 min before first meal of the day 30 tablet 2   spironolactone (ALDACTONE) 25 MG tablet Take 1/2 (one-half) tablet by mouth once daily 45 tablet 2   torsemide (DEMADEX) 20 MG tablet Take 3 tablets (60 mg total) by mouth daily. 90 tablet 11   No current facility-administered medications on file prior to visit.    Allergies  Allergen Reactions   Contrast Media [Iodinated Diagnostic Agents] Hives and Other (See Comments)    Per pt strong burning sensation starting in chest radiating outward    Clonidine Derivatives Other (See Comments)    Throat dry  Losartan  Itching   Statins Other (See Comments)    "bones hurt"   Sulfa Antibiotics Diarrhea    Tremors    Celebrex [Celecoxib] Rash   Isosorbide Nitrate Itching and Rash   Other Itching and Rash    Plastic and paper tape and heart monitor pads   Tape Itching and Rash    Red Where applied and will spread   Social History   Socioeconomic History   Marital status: Married    Spouse name: Not on file   Number of children: Not on file   Years of education: Not on file   Highest education level: Not on file  Occupational History   Not on file  Social Needs   Financial resource strain: Not on file   Food insecurity    Worry: Not on file    Inability: Not on file   Transportation needs    Medical: Not on file    Non-medical: Not on file  Tobacco Use   Smoking status: Never Smoker   Smokeless tobacco: Never Used  Substance and Sexual Activity   Alcohol use: No   Drug use: No   Sexual activity: Not Currently  Lifestyle   Physical activity    Days per week: Not on file    Minutes per session: Not on file   Stress: Not on file  Relationships   Social connections    Talks on phone: Not on file    Gets together: Not on file    Attends religious service: Not on file    Active member of club or organization: Not on file    Attends meetings of clubs or organizations: Not on file    Relationship status: Not on file   Intimate partner violence    Fear of current or ex partner: Not on file    Emotionally abused: Not on file    Physically abused: Not on file    Forced sexual activity: Not on file  Other Topics Concern   Not on file  Social History Narrative   Not on file      Review of Systems  Musculoskeletal: Positive for back pain.  All other systems reviewed and are negative.      Objective:   Physical Exam  Constitutional: She appears well-developed and well-nourished. No distress.  HENT:  Right Ear: External ear normal.  Left Ear: External  ear normal.  Nose: Rhinorrhea present. No mucosal edema.  Mouth/Throat: Oropharynx is clear and moist. No oropharyngeal exudate.  Eyes: Conjunctivae are normal.  Neck: Trachea normal, normal range of motion and phonation normal. Neck supple. Normal carotid pulses and no JVD present. Carotid bruit is not present. No edema and no erythema present. No thyroid mass present.  Cardiovascular: Normal rate and normal heart sounds. An irregularly irregular rhythm present.  Pulmonary/Chest: Effort normal and breath sounds normal. No respiratory distress. She has no wheezes. She has no rales. She exhibits no tenderness.  Abdominal: Normal appearance and bowel sounds are normal. She exhibits no distension. There is no abdominal tenderness. There is no rigidity, no rebound and no guarding.  Musculoskeletal:        General: No edema.  Lymphadenopathy:    She has no cervical adenopathy.  Skin: Skin is warm. She is not diaphoretic. No erythema.  Vitals reviewed.   Tender to palpation in the right posterior ribs from the costovertebral angle to the mid axillary line along the body of the 10th and 11th ribs  Assessment & Plan:  1. Rib pain on right side I will obtain an x-ray of the ribs to determine if the patient has fractured rib.  I suspect that she Lovins have bruised ribs due to a fall or other some mild injury given the fact that her pain is exacerbated by gentle palpation.  Await the results of the x-ray.  I can appreciate no signs of pneumonia on physical exam.  She denies any cough or shortness of breath or pleurisy. - DG Ribs Unilateral Right; Future  2. Near syncope Patient continues to have episodes of near syncope and "weakness that occur suddenly and make her feel lightheaded like she Laguardia pass out.  I am concerned about cardiac arrhythmias.  I will have the patient wear a Holter monitor to evaluate for cardiac arrhythmias that Pinch cause her near syncopal episodes

## 2019-03-07 ENCOUNTER — Telehealth: Payer: Self-pay | Admitting: Radiology

## 2019-03-07 NOTE — Telephone Encounter (Signed)
Enrolled patient for a 3 day Zio monitor to be mailed. Brief instructions were gone over with patient and she knows to expect the monitor to arrive in 3-4 days.  *24hr holter was changed to 3 day Zio so monitor could be mailed due to covid.

## 2019-03-08 ENCOUNTER — Ambulatory Visit: Payer: Medicare Other | Admitting: Internal Medicine

## 2019-03-08 ENCOUNTER — Encounter: Payer: Self-pay | Admitting: Internal Medicine

## 2019-03-08 ENCOUNTER — Other Ambulatory Visit: Payer: Self-pay

## 2019-03-08 DIAGNOSIS — I272 Pulmonary hypertension, unspecified: Secondary | ICD-10-CM

## 2019-03-08 DIAGNOSIS — R0609 Other forms of dyspnea: Secondary | ICD-10-CM | POA: Diagnosis not present

## 2019-03-08 DIAGNOSIS — R0600 Dyspnea, unspecified: Secondary | ICD-10-CM

## 2019-03-08 NOTE — Progress Notes (Signed)
[name], female    DOB: [birth year],   MRN: [id number]   Brief patient profile:  53 yowf  never smoker with onset doe  2018 and doe s cough referred to pulmonary clinic 10/26/2018 by [name] with [name] ? OSA but refused sleep study.    History of Present Illness  10/26/2018  Pulmonary/ 1st office eval/[name]  Chief Complaint  Patient presents with  . Pulmonary Consult    Referred by Susan. Radford [name] for eval of abormal PFT done 10/06/2018. Pt states has occ CP and DOE- started "a good while ago".    Dyspnea:  sev times a weeks a week to MB and stops about half way back uphill whereas 2 years ago could do this s stopping = MMRC1 = can walk nl pace, flat grade, can't hurry or go uphills or steps s sob  And some midline "stabbing" chest/abd discomfort (post op lap colectomy dec 2019) that   usually only comes on with ex but not with cough and does not lateralize or radiated Cough: no  Sleep: one pillow on side or back better since diuresis/ reduction in leg swelling   SABA use: ? seemed help in past  rec Pantoprazole (protonix) 40 mg   Take  30-60 min before first meal of the day and Pepcid (famotidine)  20 mg one after supper  until return to office - this is the best way to tell whether stomach acid is contributing to your problem. GERD diet  Keep walking mailbox and back as much /often as you can to help tell us whether you are making progress or need more evaluation/ treatment at your next visit (remember what I told you about the eye doctor)     03/08/2019  f/u ov/[name] re:  [name] - [place] Chief Complaint  Patient presents with  . Follow-up    Breathing is doing better since the last visit. No new co's.   Dyspnea:  mb back to house is easier than at last ov  Cough: none Sleeping: ok flat SABA use: none 02: none   No obvious day to day or daytime variability or assoc excess/ purulent sputum or mucus plugs or hemoptysis or cp or chest tightness, subjective wheeze or overt sinus or hb symptoms.    Sleeping  without nocturnal  or early am exacerbation  of respiratory  c/o's or need for noct saba. Also denies any obvious fluctuation of symptoms with weather or environmental changes or other aggravating or alleviating factors except as outlined above   No unusual exposure hx or h/o childhood pna/ asthma or knowledge of premature birth.  Current Allergies, Complete Past Medical History, Past Surgical History, Family History, and Social History were reviewed in [employer] record.  ROS  The following are not active complaints unless bolded Hoarseness, sore throat, dysphagia, dental problems, itching, sneezing,  nasal congestion or discharge of excess mucus or purulent secretions, ear ache,   fever, chills, sweats, unintended wt loss or wt gain, classically pleuritic or exertional cp,  orthopnea pnd or arm/hand swelling  or leg swelling, presyncope, palpitations, abdominal pain, anorexia, nausea, vomiting, diarrhea  or change in bowel habits or change in bladder habits, change in stools or change in urine, dysuria, hematuria,  rash, arthralgias/positonal back pain s radicular features , visual complaints, headache, numbness, weakness or ataxia or problems with walking or coordination,  change in mood or  memory.        Current Meds  Medication Sig  . acetaminophen (  TYLENOL) 500 MG tablet Take 500-1,000 mg by mouth every 6 (six) hours as needed for headache (pain).  Marland Kitchen amLODipine (NORVASC) 5 MG tablet Take 1.5 tablets (7.5 mg total) by mouth daily.  Marland Kitchen augmented betamethasone dipropionate (DIPROLENE-AF) 0.05 % ointment Apply 1 application topically 2 (two) times daily as needed (for skin irritation/rashes.).   Marland Kitchen clonazePAM (KLONOPIN) 0.5 MG tablet Take 1 tablet (0.5 mg total) by mouth 2 (two) times daily as needed (anxiety/sleep.). for anxiety  . cloNIDine (CATAPRES) 0.1 MG tablet TAKE 1 TABLET BY MOUTH TWICE DAILY  . ELIQUIS 5 MG TABS tablet Take 1 tablet by mouth twice daily   . famotidine (PEPCID) 20 MG tablet One at bedtime  . Guaifenesin (MUCINEX MAXIMUM STRENGTH) 1200 MG TB12 Take 1,200 mg by mouth daily as needed (congestion).  Marland Kitchen losartan (COZAAR) 100 MG tablet Take 1 tablet (100 mg total) by mouth daily.  . LUBRICANT EYE DROPS 0.4-0.3 % SOLN Place 1 drop into both eyes 3 (three) times daily as needed (for dry/irritated eyes.).  Marland Kitchen nitroGLYCERIN (NITROSTAT) 0.4 MG SL tablet DISSOLVE ONE TABLET UNDER THE TONGUE EVERY 5 MINUTES AS NEEDED FOR CHEST PAIN.  DO NOT EXCEED A TOTAL OF 3 DOSES IN 15 MINUTES  . spironolactone (ALDACTONE) 25 MG tablet Take 1/2 (one-half) tablet by mouth once daily  . torsemide (DEMADEX) 20 MG tablet Take 3 tablets (60 mg total) by mouth daily.                Past Medical History:  Diagnosis Date  . Allergy    rhinitis  . Aortic stenosis    mild by echo 06/2017  . Arthritis   . Bradycardia    a. 10/2017 -> beta blocker cut back due to HR 39.  . Breast cancer ([place]) 01/06/2012  . Cancer [facility])    right colon and left breast  . Chronic diastolic CHF (congestive heart failure) ([place])   . Colon cancer ([place]) 01/06/2012  . Colovesical fistula    Susan. Marlou [name] and Susan. Alinda [name] planning surgery 08/2018  . COPD (chronic obstructive pulmonary disease) ([place])    pt. denies  . Coronary artery disease 2006   a.  NSTEMI in 2016, cath showed 15% prox-mid RCA, 20% prox LAD, EF 25-35% by cath and 35-40% -> felt due to Takotsubo cardiomyopathy.  . Dilated aortic root ([place])    56mmHg by echo 06/2017  . Diverticulosis   . Dyspnea   . Edema extremities   . GERD (gastroesophageal reflux disease)   . Hernia   . Hiatal hernia    denies  . Hyperlipidemia   . Hypertension   . Mild aortic stenosis    echo 11/2015 but not noted on echo 06/2016  . Osteopenia   . Permanent atrial fibrillation    chronic atrial fibrillation  . Pneumonia    hx child  . Pulmonary HTN ([place])    a. moderate to severe PASP 38mmHg echo 11/2015 - now 50mmHg by echo 06/2017. CTA  chest in 11/16 with no PE. PFTs in 7/15 with mild obstructive lung disease. She had a negative sleep study in 2017. b. Felt due to left sided HF.  Marland Kitchen Stroke ([place])   . Takotsubo syndrome 07/29/2015   a. EF 35-40% by echo; akinesis of mid-apical anteroseptal and apical myocardium.  EF now normalized on echo 11/2015      Objective:       03/08/2019         186  10/26/18 184 lb (83.5  kg)  09/29/18 183 lb (83 kg)  09/28/18 183 lb (83 kg)   vs   196  07/07/18      [facility] amb wf nad   Vital signs reviewed - Note on arrival 02 sats  97% on RA       HEENT: Edentulous / nl turbinates bilaterally, and oropharynx. Nl external ear canals without cough reflex   NECK :  without JVD/Nodes/TM/ nl carotid upstrokes bilaterally   LUNGS: no acc muscle use,  Nl contour chest which is clear to A and P bilaterally without cough on insp or exp maneuvers   CV:  RRR  no s3 or murmur or increase in P2, and trace bilaterally sym LE pitting edema.   ABD:  soft and nontender with nl inspiratory excursion in the supine position. No bruits or organomegaly appreciated, bowel sounds nl  MS:  Nl gait/ ext warm without deformities, calf tenderness, cyanosis or clubbing No obvious joint restrictions   SKIN: warm and dry without lesions    NEURO:  alert, approp, nl sensorium with  no motor or cerebellar deficits apparent.       I personally reviewed images and agree with radiology impression as follows:  CXR:   02/27/2019 Lung volumes are normal. No consolidative airspace disease. No pleural effusions. No evidence of pulmonary edema. No pneumothorax. Heart size is mildly enlarged.         Assessment

## 2019-03-08 NOTE — Patient Instructions (Addendum)
If any worsening of your breathing or pulmonary hypertension, I would repeat your overnight 02 study to make sure your 02 level at night is adequate  Pulmonary follow up is as needed

## 2019-03-08 NOTE — Assessment & Plan Note (Signed)
Onset 2018  - Echo 09/08/2018 Left ventricle: The cavity size was normal. Systolic function was   normal. The estimated ejection fraction was in the range of 60%   to 65%. Wall motion was normal; there were no regional wall   motion abnormalities. The study is not technically sufficient to   allow evaluation of LV diastolic function. - Aortic valve: Trileaflet; moderately thickened, moderately   calcified leaflets. - Mitral valve: Calcified annulus. There was mild regurgitation. - Left atrium: The atrium was mildly dilated. - Right ventricle: The cavity size was mildly dilated. Wall   thickness was normal. - Right atrium: The atrium was moderately dilated. - Tricuspid valve: There was mild regurgitation. - Pulmonary arteries: Systolic pressure was moderately increased.   PA peak pressure: 62 mm Hg (S). PFT's  10/06/2018  FEV1 1.16 (72 % ) ratio 0.72  p 19 % improvement from saba p nothing  prior to study with DLCO  55/61c % corrects to 90 % for alv volume  And min curvature/ ERV 53%  - 10/26/2018   Walked RA x one lap =  approx 250 ft -  slower than average pace/no SOB/stopped due to stomach discomfort with sats 99 and pulse only 54   She has improved since her previous evaluation here with optimal diuresis and no evidence of progressive right heart failure.

## 2019-03-08 NOTE — Assessment & Plan Note (Addendum)
The only issue I see that is not been addressed is whether there could be a contribution of nocturnal hypoxemia here.  I recommended an overnight pulse oximetry at the last visit but do not see that it was ever done.  However, overall she is doing so much better that I do not believe that is necessary and would defer a nocturnal issues to Drs. Turner since she is already been involved in the evaluation for sleep apnea.  Discussed in detail all the  indications, usual  risks and alternatives  relative to the benefits with patient who agrees to proceed with conservative rx as outlined  And f/u here prn    Each maintenance medication was reviewed in detail including most importantly the difference between maintenance and as needed and under what circumstances the prns are to be used.  Please see AVS for specific  Instructions which are unique to this visit and I personally typed out  which were reviewed in detail in writing with the patient and a copy provided.

## 2019-03-13 ENCOUNTER — Telehealth: Payer: Self-pay | Admitting: Cardiology

## 2019-03-13 NOTE — Telephone Encounter (Signed)
  Patient was told by her insurance that it would cost her $900.00 for her to do the [employer] because insurance will not cover it. She cannot afford to do that.

## 2019-03-14 NOTE — Telephone Encounter (Signed)
Follow up ° ° °Patient is returning your call. Please call. ° ° ° °

## 2019-03-14 NOTE — Telephone Encounter (Signed)
Please find out what type of monitor her insurance will cover

## 2019-03-14 NOTE — Telephone Encounter (Signed)
Left a message for the patient to call back.  

## 2019-03-15 NOTE — Telephone Encounter (Signed)
Spoke with the patient, she is going to call her insurance to determine what monitor is covered.

## 2019-03-22 ENCOUNTER — Other Ambulatory Visit: Payer: Self-pay | Admitting: Family Medicine

## 2019-03-22 DIAGNOSIS — F064 Anxiety disorder due to known physiological condition: Secondary | ICD-10-CM

## 2019-03-22 DIAGNOSIS — K5792 Diverticulitis of intestine, part unspecified, without perforation or abscess without bleeding: Secondary | ICD-10-CM

## 2019-03-22 NOTE — Telephone Encounter (Signed)
Ok to refill??  Last office visit  Last refill 10/12/2018, #1 refills.

## 2019-03-26 ENCOUNTER — Telehealth: Payer: Self-pay | Admitting: Cardiology

## 2019-03-26 NOTE — Telephone Encounter (Signed)

## 2019-03-27 ENCOUNTER — Ambulatory Visit (INDEPENDENT_AMBULATORY_CARE_PROVIDER_SITE_OTHER): Payer: Medicare Other | Admitting: Cardiology

## 2019-03-27 ENCOUNTER — Encounter: Payer: Self-pay | Admitting: Cardiology

## 2019-03-27 ENCOUNTER — Other Ambulatory Visit: Payer: Self-pay

## 2019-03-27 VITALS — BP 122/60 | HR 55 | Ht 66.0 in | Wt 183.0 lb

## 2019-03-27 DIAGNOSIS — I4821 Permanent atrial fibrillation: Secondary | ICD-10-CM

## 2019-03-27 DIAGNOSIS — R55 Syncope and collapse: Secondary | ICD-10-CM

## 2019-03-27 DIAGNOSIS — R001 Bradycardia, unspecified: Secondary | ICD-10-CM

## 2019-03-27 NOTE — Patient Instructions (Signed)
Medication Instructions:  none If you need a refill on your cardiac medications before your next appointment, please call your pharmacy.   Lab work: none If you have labs (blood work) drawn today and your tests are completely normal, you will receive your results only by: Marland Kitchen MyChart Message (if you have MyChart) OR . A paper copy in the mail If you have any lab test that is abnormal or we need to change your treatment, we will call you to review the results.  Testing/Procedures: ZIO Monitor 3 Day   Follow-Up: At [facility], you and your health needs are our priority.  As part of our continuing mission to provide you with exceptional heart care, we have created designated Provider Care Teams.  These Care Teams include your primary Cardiologist (physician) and Advanced Practice Providers (APPs -  Physician Assistants and Nurse Practitioners) who all work together to provide you with the care you need, when you need it. .   Any Other Special Instructions Will Be Listed Below (If Applicable).

## 2019-03-27 NOTE — Progress Notes (Signed)
03/27/2019 [name]   September 09, 1934  [id number]  Primary Physician [name], Cammie Mcgee, MD Primary Cardiologist: Fransico Him, MD  Electrophysiologist: None   Reason for Visit/CC: F/u for bradycardia and chronic atrial fibrillation   HPI:  [name] is a 84 y.o. female, followed by Dr. [name],  with a hx of of Takotsubo CM 2016, with recovered EF, nonobstructive CAD, HTN, chronic diastolic heart failure, pulmonary hypertension, mild AS and permanent atrial fibrillation.  she is on chronic anticoagulation w/ Eliquis.  Her last echocardiogram December 2019 showed normal left ventricular EF and mild aortic stenosis.  Last month in June, she was seen by her PCP for evaluation of near syncope.  She was found to be bradycardic with heart rate in the 40s.  Her PCP discontinued her metoprolol and had concerns for possible tachybradycardia syndrome.  Recommendations were for outpatient monitor to further screen for recurrent bradycardia/rapid atrial fibrillation.  Patient presents to clinic today for follow-up and further assessment.  EKG today shows atrial fibrillation with a slow ventricular response at 55 bpm.  She denies any syncope but does occasionally get dizzy.  She remains off metoprolol.  Reports full compliance with Eliquis.  No falls and no abnormal bleeding.  She also denies dyspnea.  No chest pain.  Blood pressures well controlled today 122/60.    Current Meds  Medication Sig  . acetaminophen (TYLENOL) 500 MG tablet Take 500-1,000 mg by mouth every 6 (six) hours as needed for headache (pain).  Marland Kitchen amLODipine (NORVASC) 5 MG tablet Take 1.5 tablets (7.5 mg total) by mouth daily.  Marland Kitchen augmented betamethasone dipropionate (DIPROLENE-AF) 0.05 % ointment Apply 1 application topically 2 (two) times daily as needed (for skin irritation/rashes.).   Marland Kitchen clonazePAM (KLONOPIN) 0.5 MG tablet TAKE 1 TABLET BY MOUTH TWICE DAILY AS NEEDED FOR ANXIETY OR SLEEP.  . cloNIDine (CATAPRES) 0.1 MG tablet TAKE 1  TABLET BY MOUTH TWICE DAILY  . ELIQUIS 5 MG TABS tablet Take 1 tablet by mouth twice daily  . famotidine (PEPCID) 20 MG tablet One at bedtime  . Guaifenesin (MUCINEX MAXIMUM STRENGTH) 1200 MG TB12 Take 1,200 mg by mouth daily as needed (congestion).  Marland Kitchen losartan (COZAAR) 100 MG tablet Take 1 tablet (100 mg total) by mouth daily.  . LUBRICANT EYE DROPS 0.4-0.3 % SOLN Place 1 drop into both eyes 3 (three) times daily as needed (for dry/irritated eyes.).  Marland Kitchen nitroGLYCERIN (NITROSTAT) 0.4 MG SL tablet DISSOLVE ONE TABLET UNDER THE TONGUE EVERY 5 MINUTES AS NEEDED FOR CHEST PAIN.  DO NOT EXCEED A TOTAL OF 3 DOSES IN 15 MINUTES  . spironolactone (ALDACTONE) 25 MG tablet Take 1/2 (one-half) tablet by mouth once daily  . torsemide (DEMADEX) 20 MG tablet Take 3 tablets (60 mg total) by mouth daily.   Allergies  Allergen Reactions  . Contrast Media [Iodinated Diagnostic Agents] Hives and Other (See Comments)    Per pt strong burning sensation starting in chest radiating outward   . Clonidine Derivatives Other (See Comments)    Throat dry  . Statins Other (See Comments)    "bones hurt"  . Sulfa Antibiotics Diarrhea    Tremors   . Celebrex [Celecoxib] Rash  . Isosorbide Nitrate Itching and Rash  . Other Itching and Rash    Plastic and paper tape and heart monitor pads  . Tape Itching and Rash    Red Where applied and will spread   Past Medical History:  Diagnosis Date  . Allergy  rhinitis  . Aortic stenosis    mild by echo 06/2017  . Arthritis   . Bradycardia    a. 10/2017 -> beta blocker cut back due to HR 39.  . Breast cancer ([place]) 01/06/2012  . Cancer [facility])    right colon and left breast  . Chronic diastolic CHF (congestive heart failure) ([place])   . Colon cancer ([place]) 01/06/2012  . Colovesical fistula    Dr. Marlou Starks and [name] planning surgery 08/2018- surgery revealed spontaneous closure  . COPD (chronic obstructive pulmonary disease) ([place])    pt. denies  . Coronary artery disease  2006   a.  NSTEMI in 2016, cath showed 15% prox-mid RCA, 20% prox LAD, EF 25-35% by cath and 35-40% -> felt due to Takotsubo cardiomyopathy.  . Dilated aortic root ([place])    73mmHg by echo 06/2017  . Diverticulosis   . Dyspnea   . Edema extremities   . GERD (gastroesophageal reflux disease)   . Hernia   . Hiatal hernia    denies  . Hyperlipidemia   . Hypertension   . Mild aortic stenosis    echo 11/2015 but not noted on echo 06/2016  . Osteopenia   . Permanent atrial fibrillation    chronic atrial fibrillation  . Pneumonia    hx child  . Pulmonary HTN ([place])    a. moderate to severe PASP 93mmHg echo 11/2015 - now 41mmHg by echo 06/2017. CTA chest in 11/16 with no PE. PFTs in 7/15 with mild obstructive lung disease. She had a negative sleep study in 2017. b. Felt due to left sided HF.  Marland Kitchen Stroke ([place])   . Takotsubo syndrome 07/29/2015   a. EF 35-40% by echo; akinesis of mid-apical anteroseptal and apical myocardium.  EF now normalized on echo 11/2015   Family History  Problem Relation Age of Onset  . Heart disease Mother   . Heart attack Mother   . Cancer Sister        stomach and colon  . Heart disease Brother 73  . Hypertension Father   . Cancer Sister   . Stroke Neg Hx    Past Surgical History:  Procedure Laterality Date  . APPENDECTOMY    . BREAST SURGERY     lumpectomy left  . CARDIAC CATHETERIZATION    . CARDIAC CATHETERIZATION N/A 07/28/2015   Procedure: Left Heart Cath and Coronary Angiography;  Surgeon: Peter M Martinique, MD;  Location: [place] [facility];  Service: Cardiovascular;  Laterality: N/A;  . CHOLECYSTECTOMY    . COLECTOMY     right side  . CYSTOSCOPY WITH STENT PLACEMENT Left 09/04/2018   Procedure: CYSTOSCOPY WITH LEFT STENT PLACEMENT, BLADDER REPAIR, CYSTOSCOPY WITH LEFT STENT REMOVAL;  Surgeon: Raynelle Bring, MD;  [facility];  Service: Urology;  Laterality: Left;  . EXCISION OF ACCESSORY NIPPLE Bilateral 05/30/2013   Procedure: BILATERAL NIPPLE  BIOPSY;  Surgeon: Merrie Roof, MD;  Location: [place];  Service: General;  Laterality: Bilateral;  . EYE SURGERY Bilateral 12   cataracts  . IR RADIOLOGIST EVAL & MGMT  07/26/2017  . LAPAROSCOPIC RIGHT COLECTOMY N/A 09/04/2018   Procedure: LAPAROSCOPIC ASSISTED SIGMOID COLECTOMY WITH REPAIR OF FISTULA TO BLADDER;  Surgeon: Jovita Kussmaul, MD;  Location[facility];  Service: General;  Laterality: N/A;  . SPLIT NIGHT STUDY  02/02/2016       Social History   Socioeconomic History  . Marital status: Married    Spouse name: Not on file  .  Number of children: Not on file  . Years of education: Not on file  . Highest education level: Not on file  Occupational History  . Not on file  Social Needs  . Financial resource strain: Not on file  . Food insecurity    Worry: Not on file    Inability: Not on file  . Transportation needs    Medical: Not on file    Non-medical: Not on file  Tobacco Use  . Smoking status: Never Smoker  . Smokeless tobacco: Never Used  Substance and Sexual Activity  . Alcohol use: No  . Drug use: No  . Sexual activity: Not Currently  Lifestyle  . Physical activity    Days per week: Not on file    Minutes per session: Not on file  . Stress: Not on file  Relationships  . Social Herbalist on phone: Not on file    Gets together: Not on file    Attends religious service: Not on file    Active member of club or organization: Not on file    Attends meetings of clubs or organizations: Not on file    Relationship status: Not on file  . Intimate partner violence    Fear of current or ex partner: Not on file    Emotionally abused: Not on file    Physically abused: Not on file    Forced sexual activity: Not on file  Other Topics Concern  . Not on file  Social History Narrative  . Not on file     Lipid Panel     Component Value Date/Time   CHOL 145 02/14/2018 0927   TRIG 65 02/14/2018 0927   HDL 51 02/14/2018 0927   CHOLHDL 2.8 02/14/2018 0927    CHOLHDL 3.5 03/15/2016 1057   VLDL 23 03/15/2016 1057   LDLCALC 81 02/14/2018 0927    Review of Systems: General: negative for chills, fever, night sweats or weight changes.  Cardiovascular: negative for chest pain, dyspnea on exertion, edema, orthopnea, palpitations, paroxysmal nocturnal dyspnea or shortness of breath Dermatological: negative for rash Respiratory: negative for cough or wheezing Urologic: negative for hematuria Abdominal: negative for nausea, vomiting, diarrhea, bright red blood per rectum, melena, or hematemesis Neurologic: negative for visual changes, syncope, or dizziness All other systems reviewed and are otherwise negative except as noted above.   Physical Exam:  Blood pressure 122/60, pulse (!) 55, height 5\' 6"  (1.676 m), weight 183 lb (83 kg), SpO2 96 %.  General appearance: alert, cooperative and no distress Neck: no carotid bruit and no JVD Lungs: clear to auscultation bilaterally Heart: irregularly irregular rhythm and Bradycardia Extremities: varicose veins noted, venous stasis dermatitis noted and Mild lower extremity edema Pulses: 2+ and symmetric Skin: Skin color, texture, turgor normal. No rashes or lesions Neurologic: Grossly normal  EKG atrial fibrillation with slow ventricular response, 55 bpm-- personally reviewed   ASSESSMENT AND PLAN:   1.  Permanent atrial fibrillation: EKG today shows slow ventricular response 55 bpm.  She denies any palpitations.  Occasionally feels dizzy.  Metoprolol was recently discontinued due to bradycardia with recent heart rate in the 40s.  She is on Eliquis for anticoagulation and reports full compliance.  She denies any abnormal bleeding and no falls.  Agree with PCP concerns.  We will plan on outpatient monitor to rule out possibility of tachybradycardia syndrome.  2.  Bradycardia: Metoprolol recently discontinued due to recent heart rate in 40s.  Her current resting heart  rate is in the mid 50s off of AV nodal  blocking agents.  She notes some occasional dizziness but denies any syncope.  We will plan outpatient 3-day ZIO monitor to assess rates.  3.  Aortic stenosis: Mild by recent echocardiogram in December.  4.  Nonobstructive CAD: Denies anginal symptomatology.  Continue medical therapy.  5.  Chronic diastolic heart failure: Denies dyspnea.  Appears euvolemic on physical exam.  Blood pressure well controlled.  Beta-blocker discontinued due to bradycardia.  Heart rate in the mid 50s.  She is on spironolactone and torsemide.  Recent laboratory work showed stable creatinine at 1.1.  We will continue current regimen.   Follow-Up; we will plan to keep follow-up with Dr. [name] in January 2021.  If her outpatient monitor reveals significant bradycardia/tachycardia, will bring back for further management.  Kolyn Rozario Ladoris Gene, MHS [facility] 03/27/2019 10:34 AM

## 2019-03-29 ENCOUNTER — Telehealth: Payer: Self-pay | Admitting: Radiology

## 2019-03-29 NOTE — Telephone Encounter (Signed)
Attempted to reach patient to verify info and briefly go over monitor instructions before having monitor mailed.

## 2019-03-29 NOTE — Telephone Encounter (Signed)
Enrolled patient for a 3 day Zio monitor to be mailed. Brief instructions were gone over with patient and she knows to expect the monitor to arrive in 3-4 days. Patient was concerned about the cost. I talked to Ailene Ravel (Zio rep) she looked patients insurance up and per the rep patients technical cost should be $0.

## 2019-03-29 NOTE — Telephone Encounter (Signed)
Patient returned your call, please call home number.

## 2019-04-03 ENCOUNTER — Other Ambulatory Visit (INDEPENDENT_AMBULATORY_CARE_PROVIDER_SITE_OTHER): Payer: Medicare Other

## 2019-04-03 DIAGNOSIS — I4821 Permanent atrial fibrillation: Secondary | ICD-10-CM

## 2019-04-03 DIAGNOSIS — R001 Bradycardia, unspecified: Secondary | ICD-10-CM

## 2019-04-03 DIAGNOSIS — R55 Syncope and collapse: Secondary | ICD-10-CM

## 2019-04-06 ENCOUNTER — Ambulatory Visit (INDEPENDENT_AMBULATORY_CARE_PROVIDER_SITE_OTHER): Payer: Medicare Other | Admitting: Family Medicine

## 2019-04-06 ENCOUNTER — Encounter: Payer: Self-pay | Admitting: Family Medicine

## 2019-04-06 ENCOUNTER — Other Ambulatory Visit: Payer: Self-pay

## 2019-04-06 VITALS — BP 136/62 | HR 54 | Temp 98.4°F | Resp 18 | Ht 67.0 in | Wt 185.0 lb

## 2019-04-06 DIAGNOSIS — M542 Cervicalgia: Secondary | ICD-10-CM | POA: Diagnosis not present

## 2019-04-06 MED ORDER — TIZANIDINE HCL 4 MG PO TABS: 4.0000 mg | ORAL_TABLET | Freq: Four times a day (QID) | ORAL | 0 refills | Status: DC | PRN

## 2019-04-06 NOTE — Progress Notes (Signed)
Subjective:    Patient ID: [name], female    DOB: February 27, 1935, 84 y.o.   MRN: [id number]  HPI Patient presents with 3 days of pain in the right side of her neck.  The pain is located at the insertion of the sternocleidomastoid muscle on the mastoid process.  The patient states that there is no exacerbating or alleviating factor.  She will suddenly develop pain in the "leader" as she describes it where the muscle attaches on the mastoid process.  Palpation there triggers the pain.  There is no erythema.  There is no warmth.  There is no swelling.  There is no palpable abnormality on exam.  There is no lymphadenopathy in the neck.  Carotid arteries do not show any evidence of a bruit.  She has no pain or tenderness on palpation of the spinous processes.  There is no palpable abnormality.  There is no swelling in the neck.  Examination of the posterior oropharynx is normal aside from a small yellow cyst seen on the right peritonsillar fold that is benign and approximately 4 mm in diameter.  Cranial nerves II through XII are grossly intact with muscle strength 5/5 equal and symmetric in the upper and lower extremities.  She has normal sensation in the right arm and the left arm.  She has normal muscle strength in both upper extremities.  She has normal reflexes. Past Medical History:  Diagnosis Date  . Allergy    rhinitis  . Aortic stenosis    mild by echo 06/2017  . Arthritis   . Bradycardia    a. 10/2017 -> beta blocker cut back due to HR 39.  . Breast cancer ([place]) 01/06/2012  . Cancer [facility])    right colon and left breast  . Chronic diastolic CHF (congestive heart failure) ([place])   . Colon cancer ([place]) 01/06/2012  . Colovesical fistula    Dr. Marlou Starks and [name] planning surgery 08/2018- surgery revealed spontaneous closure  . COPD (chronic obstructive pulmonary disease) ([place])    pt. denies  . Coronary artery disease 2006   a.  NSTEMI in 2016, cath showed 15% prox-mid RCA, 20% prox LAD, EF  25-35% by cath and 35-40% -> felt due to Takotsubo cardiomyopathy.  . Dilated aortic root ([place])    74mmHg by echo 06/2017  . Diverticulosis   . Dyspnea   . Edema extremities   . GERD (gastroesophageal reflux disease)   . Hernia   . Hiatal hernia    denies  . Hyperlipidemia   . Hypertension   . Mild aortic stenosis    echo 11/2015 but not noted on echo 06/2016  . Osteopenia   . Permanent atrial fibrillation    chronic atrial fibrillation  . Pneumonia    hx child  . Pulmonary HTN ([place])    a. moderate to severe PASP 69mmHg echo 11/2015 - now 55mmHg by echo 06/2017. CTA chest in 11/16 with no PE. PFTs in 7/15 with mild obstructive lung disease. She had a negative sleep study in 2017. b. Felt due to left sided HF.  Marland Kitchen Stroke ([place])   . Takotsubo syndrome 07/29/2015   a. EF 35-40% by echo; akinesis of mid-apical anteroseptal and apical myocardium.  EF now normalized on echo 11/2015   Past Surgical History:  Procedure Laterality Date  . APPENDECTOMY    . BREAST SURGERY     lumpectomy left  . CARDIAC CATHETERIZATION    . CARDIAC CATHETERIZATION N/A 07/28/2015   Procedure:  Left Heart Cath and Coronary Angiography;  Surgeon: Peter M Martinique, MD;  Location: [place] [facility];  Service: Cardiovascular;  Laterality: N/A;  . CHOLECYSTECTOMY    . COLECTOMY     right side  . CYSTOSCOPY WITH STENT PLACEMENT Left 09/04/2018   Procedure: CYSTOSCOPY WITH LEFT STENT PLACEMENT, BLADDER REPAIR, CYSTOSCOPY WITH LEFT STENT REMOVAL;  Surgeon: Raynelle Bring, MD;  [facility];  Service: Urology;  Laterality: Left;  . EXCISION OF ACCESSORY NIPPLE Bilateral 05/30/2013   Procedure: BILATERAL NIPPLE BIOPSY;  Surgeon: Merrie Roof, MD;  Location: [facility];  Service: General;  Laterality: Bilateral;  . EYE SURGERY Bilateral 12   cataracts  . IR RADIOLOGIST EVAL & MGMT  07/26/2017  . LAPAROSCOPIC RIGHT COLECTOMY N/A 09/04/2018   Procedure: LAPAROSCOPIC ASSISTED SIGMOID COLECTOMY WITH REPAIR OF FISTULA TO  BLADDER;  Surgeon: Jovita Kussmaul, MD;  [facility];  Service: General;  Laterality: N/A;  . SPLIT NIGHT STUDY  02/02/2016       Current Outpatient Medications on File Prior to Visit  Medication Sig Dispense Refill  . acetaminophen (TYLENOL) 500 MG tablet Take 500-1,000 mg by mouth every 6 (six) hours as needed for headache (pain).    Marland Kitchen amLODipine (NORVASC) 5 MG tablet Take 1.5 tablets (7.5 mg total) by mouth daily. 135 tablet 3  . augmented betamethasone dipropionate (DIPROLENE-AF) 0.05 % ointment Apply 1 application topically 2 (two) times daily as needed (for skin irritation/rashes.).   1  . clonazePAM (KLONOPIN) 0.5 MG tablet TAKE 1 TABLET BY MOUTH TWICE DAILY AS NEEDED FOR ANXIETY OR SLEEP. 60 tablet 1  . cloNIDine (CATAPRES) 0.1 MG tablet TAKE 1 TABLET BY MOUTH TWICE DAILY 180 tablet 2  . ELIQUIS 5 MG TABS tablet Take 1 tablet by mouth twice daily 180 tablet 2  . famotidine (PEPCID) 20 MG tablet One at bedtime 30 tablet 11  . Guaifenesin (MUCINEX MAXIMUM STRENGTH) 1200 MG TB12 Take 1,200 mg by mouth daily as needed (congestion).    Marland Kitchen losartan (COZAAR) 100 MG tablet Take 1 tablet (100 mg total) by mouth daily. 90 tablet 1  . LUBRICANT EYE DROPS 0.4-0.3 % SOLN Place 1 drop into both eyes 3 (three) times daily as needed (for dry/irritated eyes.).    Marland Kitchen nitroGLYCERIN (NITROSTAT) 0.4 MG SL tablet DISSOLVE ONE TABLET UNDER THE TONGUE EVERY 5 MINUTES AS NEEDED FOR CHEST PAIN.  DO NOT EXCEED A TOTAL OF 3 DOSES IN 15 MINUTES 25 tablet 0  . spironolactone (ALDACTONE) 25 MG tablet Take 1/2 (one-half) tablet by mouth once daily 45 tablet 2  . torsemide (DEMADEX) 20 MG tablet Take 3 tablets (60 mg total) by mouth daily. 90 tablet 11   No current facility-administered medications on file prior to visit.    Allergies  Allergen Reactions  . Contrast Media [Iodinated Diagnostic Agents] Hives and Other (See Comments)    Per pt strong burning sensation starting in chest radiating outward   .  Clonidine Derivatives Other (See Comments)    Throat dry  . Statins Other (See Comments)    "bones hurt"  . Sulfa Antibiotics Diarrhea    Tremors   . Celebrex [Celecoxib] Rash  . Isosorbide Nitrate Itching and Rash  . Other Itching and Rash    Plastic and paper tape and heart monitor pads  . Tape Itching and Rash    Red Where applied and will spread   Social History   Socioeconomic History  . Marital status: Married  Spouse name: Not on file  . Number of children: Not on file  . Years of education: Not on file  . Highest education level: Not on file  Occupational History  . Not on file  Social Needs  . Financial resource strain: Not on file  . Food insecurity    Worry: Not on file    Inability: Not on file  . Transportation needs    Medical: Not on file    Non-medical: Not on file  Tobacco Use  . Smoking status: Never Smoker  . Smokeless tobacco: Never Used  Substance and Sexual Activity  . Alcohol use: No  . Drug use: No  . Sexual activity: Not Currently  Lifestyle  . Physical activity    Days per week: Not on file    Minutes per session: Not on file  . Stress: Not on file  Relationships  . Social Herbalist on phone: Not on file    Gets together: Not on file    Attends religious service: Not on file    Active member of club or organization: Not on file    Attends meetings of clubs or organizations: Not on file    Relationship status: Not on file  . Intimate partner violence    Fear of current or ex partner: Not on file    Emotionally abused: Not on file    Physically abused: Not on file    Forced sexual activity: Not on file  Other Topics Concern  . Not on file  Social History Narrative  . Not on file      Review of Systems  All other systems reviewed and are negative.      Objective:   Physical Exam Vitals signs reviewed.  HENT:     Right Ear: Tympanic membrane and ear canal normal.     Left Ear: Tympanic membrane and ear canal  normal.     Nose: Nose normal.     Mouth/Throat:     Mouth: Mucous membranes are moist.     Pharynx: No oropharyngeal exudate or posterior oropharyngeal erythema.  Eyes:     Extraocular Movements: Extraocular movements intact.     Conjunctiva/sclera: Conjunctivae normal.     Pupils: Pupils are equal, round, and reactive to light.  Neck:   Cardiovascular:     Rate and Rhythm: Normal rate and regular rhythm.     Heart sounds: Normal heart sounds.  Pulmonary:     Effort: Pulmonary effort is normal.     Breath sounds: Normal breath sounds.  Neurological:     General: No focal deficit present.     Mental Status: She is oriented to person, place, and time.     Cranial Nerves: No cranial nerve deficit.     Sensory: No sensory deficit.     Motor: No weakness.     Coordination: Coordination normal.     Gait: Gait normal.     Deep Tendon Reflexes: Reflexes normal.           Assessment & Plan:  The encounter diagnosis was Neck pain. Location of the pain is at the insertion of the sternocleidomastoid muscle on the mastoid process.  Therefore this could be a pulled or strained muscle and these could be muscle spasms the patient is experiencing.  I will treat that with Zanaflex 4 mg every 6 hours as needed.  However the sudden shocklike nature is almost neuropathic in nature and could represent nerve impingement in  the cervical spine.  There is no abnormality on her neurologic exam today however if the pain persists, I would recommend imaging of the cervical spine to evaluate further.

## 2019-04-11 ENCOUNTER — Other Ambulatory Visit: Payer: Self-pay

## 2019-04-11 ENCOUNTER — Telehealth: Payer: Self-pay | Admitting: Cardiology

## 2019-04-11 DIAGNOSIS — R001 Bradycardia, unspecified: Secondary | ICD-10-CM | POA: Diagnosis not present

## 2019-04-11 DIAGNOSIS — I4821 Permanent atrial fibrillation: Secondary | ICD-10-CM | POA: Diagnosis not present

## 2019-04-11 DIAGNOSIS — R55 Syncope and collapse: Secondary | ICD-10-CM | POA: Diagnosis not present

## 2019-04-11 NOTE — Telephone Encounter (Signed)
Called with a Zio patch abnormal reading.

## 2019-04-11 NOTE — Telephone Encounter (Signed)
Received phone call from [place] regarding Zio patch.  She is reporting pt wore patch from 7/28-7/31 on 7/29 at 3:16 am pt had episode of slow At Fib with sustained rate of 32 bmp.  Vivien Rota will fax strip over - reports episode is on page 11, strip #3.  Will have scanned into Epic once received for Dr Theodosia Blender review.

## 2019-04-11 NOTE — Telephone Encounter (Signed)
Zio patch EOS report imported and is in Dr Theodosia Blender basket to be read.

## 2019-04-12 ENCOUNTER — Telehealth: Payer: Self-pay

## 2019-04-12 NOTE — Telephone Encounter (Signed)
Notes recorded by Frederik Schmidt, RN on 04/12/2019 at 4:12 PM EDT  The patient has been notified of the result and verbalized understanding. All questions (if any) were answered.  Frederik Schmidt, RN 04/12/2019 4:12 PM   The patient is experiencing no dizziness.Marland KitchenMarland Kitchen

## 2019-04-12 NOTE — Telephone Encounter (Signed)
I spoke to the patient and informed her to call if dizziness occurs.  She verbalized understanding.

## 2019-04-12 NOTE — Telephone Encounter (Signed)
Notes recorded by Frederik Schmidt, RN on 04/12/2019 at 1:32 PM EDT  [id number] 8/6  ------

## 2019-04-12 NOTE — Telephone Encounter (Signed)
-----   Message from [name], MD sent at 04/12/2019  1:05 PM EDT ----- Average HR still slow off BB.  Please find out if dizziness continues

## 2019-04-12 NOTE — Telephone Encounter (Signed)
Have patient call with any further dizziness

## 2019-04-17 DIAGNOSIS — R3121 Asymptomatic microscopic hematuria: Secondary | ICD-10-CM | POA: Diagnosis not present

## 2019-04-17 DIAGNOSIS — N321 Vesicointestinal fistula: Secondary | ICD-10-CM | POA: Diagnosis not present

## 2019-05-30 ENCOUNTER — Other Ambulatory Visit: Payer: Self-pay | Admitting: Cardiology

## 2019-06-18 ENCOUNTER — Ambulatory Visit (INDEPENDENT_AMBULATORY_CARE_PROVIDER_SITE_OTHER): Payer: Medicare Other

## 2019-06-18 DIAGNOSIS — Z23 Encounter for immunization: Secondary | ICD-10-CM

## 2019-07-09 ENCOUNTER — Other Ambulatory Visit: Payer: Self-pay | Admitting: Cardiology

## 2019-07-16 ENCOUNTER — Other Ambulatory Visit: Payer: Self-pay

## 2019-07-16 ENCOUNTER — Ambulatory Visit (INDEPENDENT_AMBULATORY_CARE_PROVIDER_SITE_OTHER): Payer: Medicare Other | Admitting: Family Medicine

## 2019-07-16 DIAGNOSIS — J209 Acute bronchitis, unspecified: Secondary | ICD-10-CM | POA: Diagnosis not present

## 2019-07-16 MED ORDER — AZITHROMYCIN 250 MG PO TABS: ORAL_TABLET | ORAL | 0 refills | Status: DC

## 2019-07-16 MED ORDER — ALBUTEROL SULFATE HFA 108 (90 BASE) MCG/ACT IN AERS: 2.0000 | INHALATION_SPRAY | Freq: Four times a day (QID) | RESPIRATORY_TRACT | 0 refills | Status: DC | PRN

## 2019-07-16 MED ORDER — PREDNISONE 20 MG PO TABS: ORAL_TABLET | ORAL | 0 refills | Status: DC

## 2019-07-16 NOTE — Progress Notes (Signed)
Subjective:    Patient ID: [name], female    DOB: 05-12-1935, 84 y.o.   MRN: [id number]  HPI Patient is a very pleasant 84 year old Caucasian female being seen today as a telephone visit.  She consents to be seen over the telephone.  Phone call begins at 10.  Phone call ended at 913.  Patient states that she has been battling mucus in her chest all summer long.  She is been taking Mucinex for the last several months due to chest congestion.  However over the last several days the cough has worsened.  Is now productive of white sputum.  She denies any fever but she does report shortness of breath.  She also reports wheezing.  She has tried albuterol that she has at home and the wheezing improved.  The symptoms gradually worsen starting Friday.  She denies any runny nose or sore throat or body aches. Past Medical History:  Diagnosis Date  . Allergy    rhinitis  . Aortic stenosis    mild by echo 06/2017  . Arthritis   . Bradycardia    a. 10/2017 -> beta blocker cut back due to HR 39.  . Breast cancer ([place]) 01/06/2012  . Cancer [facility])    right colon and left breast  . Chronic diastolic CHF (congestive heart failure) ([place])   . Colon cancer ([place]) 01/06/2012  . Colovesical fistula    Dr. Marlou Starks and [name] planning surgery 08/2018- surgery revealed spontaneous closure  . COPD (chronic obstructive pulmonary disease) ([place])    pt. denies  . Coronary artery disease 2006   a.  NSTEMI in 2016, cath showed 15% prox-mid RCA, 20% prox LAD, EF 25-35% by cath and 35-40% -> felt due to Takotsubo cardiomyopathy.  . Dilated aortic root ([place])    9mmHg by echo 06/2017  . Diverticulosis   . Dyspnea   . Edema extremities   . GERD (gastroesophageal reflux disease)   . Hernia   . Hiatal hernia    denies  . Hyperlipidemia   . Hypertension   . Mild aortic stenosis    echo 11/2015 but not noted on echo 06/2016  . Osteopenia   . Permanent atrial fibrillation    chronic atrial fibrillation  . Pneumonia     hx child  . Pulmonary HTN ([place])    a. moderate to severe PASP 74mmHg echo 11/2015 - now 26mmHg by echo 06/2017. CTA chest in 11/16 with no PE. PFTs in 7/15 with mild obstructive lung disease. She had a negative sleep study in 2017. b. Felt due to left sided HF.  Marland Kitchen Stroke ([place])   . Takotsubo syndrome 07/29/2015   a. EF 35-40% by echo; akinesis of mid-apical anteroseptal and apical myocardium.  EF now normalized on echo 11/2015   Past Surgical History:  Procedure Laterality Date  . APPENDECTOMY    . BREAST SURGERY     lumpectomy left  . CARDIAC CATHETERIZATION    . CARDIAC CATHETERIZATION N/A 07/28/2015   Procedure: Left Heart Cath and Coronary Angiography;  Surgeon: Peter M Martinique, MD;  Location: [place] [facility];  Service: Cardiovascular;  Laterality: N/A;  . CHOLECYSTECTOMY    . COLECTOMY     right side  . CYSTOSCOPY WITH STENT PLACEMENT Left 09/04/2018   Procedure: CYSTOSCOPY WITH LEFT STENT PLACEMENT, BLADDER REPAIR, CYSTOSCOPY WITH LEFT STENT REMOVAL;  Surgeon: Raynelle Bring, MD;  [facility];  Service: Urology;  Laterality: Left;  . EXCISION OF ACCESSORY NIPPLE Bilateral  05/30/2013   Procedure: BILATERAL NIPPLE BIOPSY;  Surgeon: Merrie Roof, MD;  Location: [place];  Service: General;  Laterality: Bilateral;  . EYE SURGERY Bilateral 12   cataracts  . IR RADIOLOGIST EVAL & MGMT  07/26/2017  . LAPAROSCOPIC RIGHT COLECTOMY N/A 09/04/2018   Procedure: LAPAROSCOPIC ASSISTED SIGMOID COLECTOMY WITH REPAIR OF FISTULA TO BLADDER;  Surgeon: Jovita Kussmaul, MD;  [facility];  Service: General;  Laterality: N/A;  . SPLIT NIGHT STUDY  02/02/2016       Current Outpatient Medications on File Prior to Visit  Medication Sig Dispense Refill  . acetaminophen (TYLENOL) 500 MG tablet Take 500-1,000 mg by mouth every 6 (six) hours as needed for headache (pain).    Marland Kitchen amLODipine (NORVASC) 5 MG tablet Take 1.5 tablets (7.5 mg total) by mouth daily. 135 tablet 3  . augmented betamethasone  dipropionate (DIPROLENE-AF) 0.05 % ointment Apply 1 application topically 2 (two) times daily as needed (for skin irritation/rashes.).   1  . clonazePAM (KLONOPIN) 0.5 MG tablet TAKE 1 TABLET BY MOUTH TWICE DAILY AS NEEDED FOR ANXIETY OR SLEEP. 60 tablet 1  . cloNIDine (CATAPRES) 0.1 MG tablet Take 1 tablet by mouth twice daily 180 tablet 2  . ELIQUIS 5 MG TABS tablet Take 1 tablet by mouth twice daily 180 tablet 2  . famotidine (PEPCID) 20 MG tablet One at bedtime 30 tablet 11  . Guaifenesin (MUCINEX MAXIMUM STRENGTH) 1200 MG TB12 Take 1,200 mg by mouth daily as needed (congestion).    Marland Kitchen losartan (COZAAR) 100 MG tablet Take 1 tablet (100 mg total) by mouth daily. 90 tablet 1  . LUBRICANT EYE DROPS 0.4-0.3 % SOLN Place 1 drop into both eyes 3 (three) times daily as needed (for dry/irritated eyes.).    Marland Kitchen nitroGLYCERIN (NITROSTAT) 0.4 MG SL tablet DISSOLVE ONE TABLET UNDER THE TONGUE EVERY 5 MINUTES AS NEEDED FOR CHEST PAIN.  DO NOT EXCEED A TOTAL OF 3 DOSES IN 15 MINUTES 25 tablet 0  . spironolactone (ALDACTONE) 25 MG tablet Take 1/2 (one-half) tablet by mouth once daily 45 tablet 2  . tiZANidine (ZANAFLEX) 4 MG tablet Take 1 tablet (4 mg total) by mouth every 6 (six) hours as needed for muscle spasms. 30 tablet 0  . torsemide (DEMADEX) 20 MG tablet Take 3 tablets by mouth once daily 90 tablet 10   No current facility-administered medications on file prior to visit.    Allergies  Allergen Reactions  . Contrast Media [Iodinated Diagnostic Agents] Hives and Other (See Comments)    Per pt strong burning sensation starting in chest radiating outward   . Clonidine Derivatives Other (See Comments)    Throat dry  . Statins Other (See Comments)    "bones hurt"  . Sulfa Antibiotics Diarrhea    Tremors   . Celebrex [Celecoxib] Rash  . Isosorbide Nitrate Itching and Rash  . Other Itching and Rash    Plastic and paper tape and heart monitor pads  . Tape Itching and Rash    Red Where applied and  will spread   Social History   Socioeconomic History  . Marital status: Married    Spouse name: Not on file  . Number of children: Not on file  . Years of education: Not on file  . Highest education level: Not on file  Occupational History  . Not on file  Social Needs  . Financial resource strain: Not on file  . Food insecurity    Worry: Not  on file    Inability: Not on file  . Transportation needs    Medical: Not on file    Non-medical: Not on file  Tobacco Use  . Smoking status: Never Smoker  . Smokeless tobacco: Never Used  Substance and Sexual Activity  . Alcohol use: No  . Drug use: No  . Sexual activity: Not Currently  Lifestyle  . Physical activity    Days per week: Not on file    Minutes per session: Not on file  . Stress: Not on file  Relationships  . Social Herbalist on phone: Not on file    Gets together: Not on file    Attends religious service: Not on file    Active member of club or organization: Not on file    Attends meetings of clubs or organizations: Not on file    Relationship status: Not on file  . Intimate partner violence    Fear of current or ex partner: Not on file    Emotionally abused: Not on file    Physically abused: Not on file    Forced sexual activity: Not on file  Other Topics Concern  . Not on file  Social History Narrative  . Not on file      Review of Systems  All other systems reviewed and are negative.      Objective:   Physical Exam   Physical exam could not be performed today as patient was seen as a telephone visit however she is speaking full and complete sentences without any respiratory distress.     Assessment & Plan:  Bronchitis, acute, with bronchospasm  Patient states that she has not left her home in the last 10 days.  The last time she left her home she went to go get groceries and she was by herself the entire time.  Neither her nor her husband have been around anyone in the last 10 days.   She has been quarantining at home.  Therefore I believe this is likely bronchitis with bronchospasm.  I recommended a prednisone taper pack with albuterol 2 puffs inhaled every 4-6 hours as needed.  Given her advanced age I will also call out a Z-Pak in case this is bacterial bronchitis.  If her symptoms are worsening she needs to be evaluated in the emergency room for chest x-ray and Covid testing.

## 2019-07-19 DIAGNOSIS — L821 Other seborrheic keratosis: Secondary | ICD-10-CM | POA: Diagnosis not present

## 2019-07-19 DIAGNOSIS — D225 Melanocytic nevi of trunk: Secondary | ICD-10-CM | POA: Diagnosis not present

## 2019-07-19 DIAGNOSIS — D2261 Melanocytic nevi of right upper limb, including shoulder: Secondary | ICD-10-CM | POA: Diagnosis not present

## 2019-07-19 DIAGNOSIS — D2262 Melanocytic nevi of left upper limb, including shoulder: Secondary | ICD-10-CM | POA: Diagnosis not present

## 2019-07-23 ENCOUNTER — Telehealth: Payer: Self-pay | Admitting: Cardiology

## 2019-07-23 ENCOUNTER — Other Ambulatory Visit: Payer: Self-pay

## 2019-07-23 ENCOUNTER — Observation Stay (HOSPITAL_COMMUNITY)
Admission: EM | Admit: 2019-07-23 | Discharge: 2019-07-24 | Disposition: A | Discharge status: Home / Self Care | Payer: Medicare Other | Attending: Cardiovascular Disease | Admitting: Cardiovascular Disease

## 2019-07-23 ENCOUNTER — Emergency Department (HOSPITAL_COMMUNITY): Payer: Medicare Other

## 2019-07-23 DIAGNOSIS — R0789 Other chest pain: Secondary | ICD-10-CM | POA: Diagnosis not present

## 2019-07-23 DIAGNOSIS — R0602 Shortness of breath: Secondary | ICD-10-CM | POA: Insufficient documentation

## 2019-07-23 DIAGNOSIS — Z85038 Personal history of other malignant neoplasm of large intestine: Secondary | ICD-10-CM | POA: Insufficient documentation

## 2019-07-23 DIAGNOSIS — I5032 Chronic diastolic (congestive) heart failure: Secondary | ICD-10-CM | POA: Insufficient documentation

## 2019-07-23 DIAGNOSIS — Z853 Personal history of malignant neoplasm of breast: Secondary | ICD-10-CM | POA: Diagnosis not present

## 2019-07-23 DIAGNOSIS — M1990 Unspecified osteoarthritis, unspecified site: Secondary | ICD-10-CM | POA: Insufficient documentation

## 2019-07-23 DIAGNOSIS — Z20828 Contact with and (suspected) exposure to other viral communicable diseases: Secondary | ICD-10-CM | POA: Diagnosis not present

## 2019-07-23 DIAGNOSIS — Z8673 Personal history of transient ischemic attack (TIA), and cerebral infarction without residual deficits: Secondary | ICD-10-CM | POA: Diagnosis not present

## 2019-07-23 DIAGNOSIS — I110 Hypertensive heart disease with heart failure: Secondary | ICD-10-CM | POA: Diagnosis not present

## 2019-07-23 DIAGNOSIS — R079 Chest pain, unspecified: Secondary | ICD-10-CM | POA: Diagnosis not present

## 2019-07-23 DIAGNOSIS — J449 Chronic obstructive pulmonary disease, unspecified: Secondary | ICD-10-CM | POA: Insufficient documentation

## 2019-07-23 DIAGNOSIS — Z79899 Other long term (current) drug therapy: Secondary | ICD-10-CM | POA: Diagnosis not present

## 2019-07-23 DIAGNOSIS — I10 Essential (primary) hypertension: Secondary | ICD-10-CM | POA: Diagnosis not present

## 2019-07-23 DIAGNOSIS — R072 Precordial pain: Secondary | ICD-10-CM

## 2019-07-23 DIAGNOSIS — K219 Gastro-esophageal reflux disease without esophagitis: Secondary | ICD-10-CM | POA: Insufficient documentation

## 2019-07-23 DIAGNOSIS — I2510 Atherosclerotic heart disease of native coronary artery without angina pectoris: Secondary | ICD-10-CM | POA: Diagnosis not present

## 2019-07-23 DIAGNOSIS — E785 Hyperlipidemia, unspecified: Secondary | ICD-10-CM | POA: Insufficient documentation

## 2019-07-23 DIAGNOSIS — Z7901 Long term (current) use of anticoagulants: Secondary | ICD-10-CM | POA: Diagnosis not present

## 2019-07-23 DIAGNOSIS — I4821 Permanent atrial fibrillation: Secondary | ICD-10-CM | POA: Diagnosis not present

## 2019-07-23 DIAGNOSIS — I350 Nonrheumatic aortic (valve) stenosis: Secondary | ICD-10-CM | POA: Insufficient documentation

## 2019-07-23 DIAGNOSIS — I272 Pulmonary hypertension, unspecified: Secondary | ICD-10-CM | POA: Insufficient documentation

## 2019-07-23 LAB — CBC
HCT: 33.4 % — ABNORMAL LOW (ref 36.0–46.0)
Hemoglobin: 10.9 g/dL — ABNORMAL LOW (ref 12.0–15.0)
MCH: 30 pg (ref 26.0–34.0)
MCHC: 32.6 g/dL (ref 30.0–36.0)
MCV: 92 fL (ref 80.0–100.0)
Platelets: 295 10*3/uL (ref 150–400)
RBC: 3.63 MIL/uL — ABNORMAL LOW (ref 3.87–5.11)
RDW: 14.5 % (ref 11.5–15.5)
WBC: 8.1 10*3/uL (ref 4.0–10.5)
nRBC: 0 % (ref 0.0–0.2)

## 2019-07-23 LAB — HEPATIC FUNCTION PANEL
ALT: 23 U/L (ref 0–44)
AST: 19 U/L (ref 15–41)
Albumin: 4.1 g/dL (ref 3.5–5.0)
Alkaline Phosphatase: 70 U/L (ref 38–126)
Bilirubin, Direct: <0.1 mg/dL (ref 0.0–0.2)
Total Bilirubin: 0.8 mg/dL (ref 0.3–1.2)
Total Protein: 7.9 g/dL (ref 6.5–8.1)

## 2019-07-23 LAB — BASIC METABOLIC PANEL
Anion gap: 12 (ref 5–15)
BUN: 47 mg/dL — ABNORMAL HIGH (ref 8–23)
CO2: 23 mmol/L (ref 22–32)
Calcium: 9.4 mg/dL (ref 8.9–10.3)
Chloride: 98 mmol/L (ref 98–111)
Creatinine, Ser: 1.26 mg/dL — ABNORMAL HIGH (ref 0.44–1.00)
GFR calc Af Amer: 46 mL/min — ABNORMAL LOW (ref >60)
GFR calc non Af Amer: 40 mL/min — ABNORMAL LOW (ref >60)
Glucose, Bld: 116 mg/dL — ABNORMAL HIGH (ref 70–99)
Potassium: 4 mmol/L (ref 3.5–5.1)
Sodium: 133 mmol/L — ABNORMAL LOW (ref 135–145)

## 2019-07-23 LAB — SARS CORONAVIRUS 2 (TAT 6-24 HRS): SARS Coronavirus 2: NEGATIVE

## 2019-07-23 LAB — TROPONIN I (HIGH SENSITIVITY)
Troponin I (High Sensitivity): 13 ng/L (ref <18)
Troponin I (High Sensitivity): 15 ng/L (ref <18)

## 2019-07-23 LAB — HEPARIN LEVEL (UNFRACTIONATED): Heparin Unfractionated: >2.2 IU/mL — ABNORMAL HIGH (ref 0.30–0.70)

## 2019-07-23 LAB — APTT: aPTT: 30 seconds (ref 24–36)

## 2019-07-23 LAB — BRAIN NATRIURETIC PEPTIDE: B Natriuretic Peptide: 66.5 pg/mL (ref 0.0–100.0)

## 2019-07-23 LAB — LIPASE, BLOOD: Lipase: 51 U/L (ref 11–51)

## 2019-07-23 MED ORDER — ONDANSETRON HCL 4 MG/2ML IJ SOLN
4.0000 mg | Freq: Four times a day (QID) | INTRAMUSCULAR | Status: DC | PRN
  Administered 2019-07-24: 05:00:00 4 mg via INTRAVENOUS
  Filled 2019-07-23: qty 2

## 2019-07-23 MED ORDER — ACETAMINOPHEN 325 MG PO TABS: 650.0000 mg | ORAL_TABLET | ORAL | Status: DC | PRN

## 2019-07-23 MED ORDER — NITROGLYCERIN 0.4 MG SL SUBL: 0.4000 mg | SUBLINGUAL_TABLET | SUBLINGUAL | Status: DC | PRN

## 2019-07-23 MED ORDER — HEPARIN (PORCINE) 25000 UT/250ML-% IV SOLN
1150.0000 [IU]/h | INTRAVENOUS | Status: DC
  Administered 2019-07-23: 20:00:00 950 [IU]/h via INTRAVENOUS
  Filled 2019-07-23: qty 250

## 2019-07-23 MED ORDER — ASPIRIN 300 MG RE SUPP: 300.0000 mg | RECTAL | Status: AC

## 2019-07-23 MED ORDER — ASPIRIN 81 MG PO CHEW
324.0000 mg | CHEWABLE_TABLET | ORAL | Status: AC
  Administered 2019-07-23: 18:00:00 324 mg via ORAL
  Filled 2019-07-23: qty 4

## 2019-07-23 MED ORDER — ASPIRIN EC 81 MG PO TBEC
81.0000 mg | DELAYED_RELEASE_TABLET | Freq: Every day | ORAL | Status: DC
  Administered 2019-07-24: 10:00:00 81 mg via ORAL
  Filled 2019-07-23: qty 1

## 2019-07-23 NOTE — ED Provider Notes (Signed)
[facility] EMERGENCY DEPARTMENT Provider Note   CSN: [id number] Arrival date & time: 07/23/19  1058     History   Chief Complaint Chief Complaint  Patient presents with  . Chest Pain    HPI [name] is a 84 y.o. female.     Patient with history of chronic diastolic heart failure on torsemide 60 mg daily, chronic A. Fib on Eliquis, low risk Myoview in 2018, mild coronary artery disease noted on cath in 2016 --presents to the emergency department with 2 days of chest pain that is waxing and waning that radiates to her back.  Pain started while patient was doing dishes 2 days ago.  She took several nitroglycerin which seemed to help.  She took several nitroglycerin yesterday and then again tomorrow which provide some relief but the pain does not go completely away.  She has not had any strokelike symptoms.  She has not had any focal weakness or numbness in extremities.  Pain is in the lower mid chest and upper abdomen.  She denies any associated vomiting or diaphoresis.  Patient was treated for an upper respiratory infection recent with antibiotics and prednisone.     Past Medical History:  Diagnosis Date  . Allergy    rhinitis  . Aortic stenosis    mild by echo 06/2017  . Arthritis   . Bradycardia    a. 10/2017 -> beta blocker cut back due to HR 39.  . Breast cancer ([place]) 01/06/2012  . Cancer [facility])    right colon and left breast  . Chronic diastolic CHF (congestive heart failure) ([place])   . Colon cancer ([place]) 01/06/2012  . Colovesical fistula    Dr. Marlou Starks and [name] planning surgery 08/2018- surgery revealed spontaneous closure  . COPD (chronic obstructive pulmonary disease) ([place])    pt. denies  . Coronary artery disease 2006   a.  NSTEMI in 2016, cath showed 15% prox-mid RCA, 20% prox LAD, EF 25-35% by cath and 35-40% -> felt due to Takotsubo cardiomyopathy.  . Dilated aortic root ([place])    67mmHg by echo 06/2017  . Diverticulosis   . Dyspnea   .  Edema extremities   . GERD (gastroesophageal reflux disease)   . Hernia   . Hiatal hernia    denies  . Hyperlipidemia   . Hypertension   . Mild aortic stenosis    echo 11/2015 but not noted on echo 06/2016  . Osteopenia   . Permanent atrial fibrillation    chronic atrial fibrillation  . Pneumonia    hx child  . Pulmonary HTN ([place])    a. moderate to severe PASP 28mmHg echo 11/2015 - now 3mmHg by echo 06/2017. CTA chest in 11/16 with no PE. PFTs in 7/15 with mild obstructive lung disease. She had a negative sleep study in 2017. b. Felt due to left sided HF.  Marland Kitchen Stroke ([place])   . Takotsubo syndrome 07/29/2015   a. EF 35-40% by echo; akinesis of mid-apical anteroseptal and apical myocardium.  EF now normalized on echo 11/2015    Patient Active Problem List   Diagnosis Date Noted  . Diverticulitis large intestine w/o perforation or abscess w/o bleeding 09/04/2018  . Colovesical fistula   . Preoperative clearance 08/07/2018  . TIA (transient ischemic attack) 01/06/2018  . Breast cancer ([place]) 09/13/2017  . Obesity, unspecified 09/13/2017  . Warfarin anticoagulation 09/13/2017  . Uterine mass 09/07/2017  . Adnexal mass 08/03/2017  . Pelvic abscess in female 07/07/2017  .  Aortic stenosis   . Dyslipidemia, goal LDL below 70 11/28/2016  . Monitoring for long-term anticoagulant use 12/31/2015  . Chronic diastolic CHF (congestive heart failure) ([place]) 10/28/2015  . Insomnia 10/23/2015  . Leg swelling 08/11/2015  . Takotsubo syndrome 07/29/2015  . DOE (dyspnea on exertion) 08/15/2014  . Dilated aortic root ([place])   . Pulmonary HTN ([place])   . Permanent atrial fibrillation ([place]) 07/04/2013  . Chronic anticoagulation 07/04/2013  . Coronary artery disease   . Lesion of nipple 05/15/2013  . GERD (gastroesophageal reflux disease)   . Hypertension   . COPD (chronic obstructive pulmonary disease) ([place])   . Osteopenia   . Diverticulosis   . Hiatal hernia   . Abdominal pain 01/11/2012  .  Breast cancer of upper-outer quadrant of left female breast ([place]) 01/06/2012  . Colon cancer ([place]) 01/06/2012    Past Surgical History:  Procedure Laterality Date  . APPENDECTOMY    . BREAST SURGERY     lumpectomy left  . CARDIAC CATHETERIZATION    . CARDIAC CATHETERIZATION N/A 07/28/2015   Procedure: Left Heart Cath and Coronary Angiography;  Surgeon: Peter M Martinique, MD;  Location: [place] [facility];  Service: Cardiovascular;  Laterality: N/A;  . CHOLECYSTECTOMY    . COLECTOMY     right side  . CYSTOSCOPY WITH STENT PLACEMENT Left 09/04/2018   Procedure: CYSTOSCOPY WITH LEFT STENT PLACEMENT, BLADDER REPAIR, CYSTOSCOPY WITH LEFT STENT REMOVAL;  Surgeon: Raynelle Bring, MD;  [facility];  Service: Urology;  Laterality: Left;  . EXCISION OF ACCESSORY NIPPLE Bilateral 05/30/2013   Procedure: BILATERAL NIPPLE BIOPSY;  Surgeon: Merrie Roof, MD;  Location: [place];  Service: General;  Laterality: Bilateral;  . EYE SURGERY Bilateral 12   cataracts  . IR RADIOLOGIST EVAL & MGMT  07/26/2017  . LAPAROSCOPIC RIGHT COLECTOMY N/A 09/04/2018   Procedure: LAPAROSCOPIC ASSISTED SIGMOID COLECTOMY WITH REPAIR OF FISTULA TO BLADDER;  Surgeon: Jovita Kussmaul, MD;  [facility];  Service: General;  Laterality: N/A;  . SPLIT NIGHT STUDY  02/02/2016         OB History    Gravida  2   Para  1   Term  1   Preterm      AB  1   Living  1     SAB  1   TAB      Ectopic      Multiple      Live Births  1            Home Medications    Prior to Admission medications   Medication Sig Start Date End Date Taking? Authorizing Provider  acetaminophen (TYLENOL) 500 MG tablet Take 500-1,000 mg by mouth every 6 (six) hours as needed for headache (pain).    [provider]  albuterol (VENTOLIN HFA) 108 (90 Base) MCG/ACT inhaler Inhale 2 puffs into the lungs every 6 (six) hours as needed for wheezing or shortness of breath. 07/16/19   Susan Frizzle, MD  amLODipine  (NORVASC) 5 MG tablet Take 1.5 tablets (7.5 mg total) by mouth daily. 09/28/18 09/28/19  [name], MD  augmented betamethasone dipropionate (DIPROLENE-AF) 0.05 % ointment Apply 1 application topically 2 (two) times daily as needed (for skin irritation/rashes.).  08/02/18   [provider]  azithromycin (ZITHROMAX) 250 MG tablet 2 tabs poqday1, 1 tab poqday 2-5 07/16/19   Susan Frizzle, MD  clonazePAM (KLONOPIN) 0.5 MG tablet TAKE 1 TABLET BY MOUTH TWICE  DAILY AS NEEDED FOR ANXIETY OR SLEEP. 03/22/19   Susan Frizzle, MD  cloNIDine (CATAPRES) 0.1 MG tablet Take 1 tablet by mouth twice daily 07/11/19   [name], MD  ELIQUIS 5 MG TABS tablet Take 1 tablet by mouth twice daily 12/11/18   [name], MD  famotidine (PEPCID) 20 MG tablet One at bedtime 10/26/18   Tanda Rockers, MD  Guaifenesin [facility] MAXIMUM STRENGTH) 1200 MG TB12 Take 1,200 mg by mouth daily as needed (congestion).    [provider]  losartan (COZAAR) 100 MG tablet Take 1 tablet (100 mg total) by mouth daily. 02/12/19   Turner, Eber Hong, MD  LUBRICANT EYE DROPS 0.4-0.3 % SOLN Place 1 drop into both eyes 3 (three) times daily as needed (for dry/irritated eyes.).    [provider]  nitroGLYCERIN (NITROSTAT) 0.4 MG SL tablet DISSOLVE ONE TABLET UNDER THE TONGUE EVERY 5 MINUTES AS NEEDED FOR CHEST PAIN.  DO NOT EXCEED A TOTAL OF 3 DOSES IN 15 MINUTES 02/12/19   Susan Frizzle, MD  predniSONE (DELTASONE) 20 MG tablet 3 tabs poqday 1-2, 2 tabs poqday 3-4, 1 tab poqday 5-6 07/16/19   Susan Frizzle, MD  spironolactone (ALDACTONE) 25 MG tablet Take 1/2 (one-half) tablet by mouth once daily 12/11/18   Turner, Eber Hong, MD  tiZANidine (ZANAFLEX) 4 MG tablet Take 1 tablet (4 mg total) by mouth every 6 (six) hours as needed for muscle spasms. 04/06/19   Susan Frizzle, MD  torsemide (DEMADEX) 20 MG tablet Take 3 tablets by mouth once daily 05/30/19   [name], MD    Family History Family  History  Problem Relation Age of Onset  . Heart disease Mother   . Heart attack Mother   . Cancer Sister        stomach and colon  . Heart disease Brother 62  . Hypertension Father   . Cancer Sister   . Stroke Neg Hx     Social History Social History   Tobacco Use  . Smoking status: Never Smoker  . Smokeless tobacco: Never Used  Substance Use Topics  . Alcohol use: No  . Drug use: No     Allergies   Contrast media [iodinated diagnostic agents], Clonidine derivatives, Statins, Sulfa antibiotics, Celebrex [celecoxib], Isosorbide nitrate, Other, and Tape   Review of Systems Review of Systems  Constitutional: Negative for diaphoresis and fever.  Eyes: Negative for redness.  Respiratory: Positive for shortness of breath (At onset, now resolved). Negative for cough.   Cardiovascular: Positive for chest pain. Negative for palpitations and leg swelling.  Gastrointestinal: Negative for abdominal pain, nausea and vomiting.  Genitourinary: Negative for dysuria.  Musculoskeletal: Positive for back pain. Negative for neck pain.  Skin: Negative for rash.  Neurological: Negative for syncope and light-headedness.  Psychiatric/Behavioral: The patient is not nervous/anxious.      Physical Exam Updated Vital Signs BP (!) 161/53 (BP Location: Right Arm)   Pulse (!) 56   Temp 97.8 F (36.6 C) (Oral)   Resp 18   SpO2 96%   Physical Exam Vitals signs and nursing note reviewed.  Constitutional:      Appearance: She is well-developed. She is not diaphoretic.  HENT:     Head: Normocephalic and atraumatic.     Mouth/Throat:     Mouth: Mucous membranes are not dry.  Eyes:     Conjunctiva/sclera: Conjunctivae normal.  Neck:     Musculoskeletal: Normal range of motion  and neck supple. No muscular tenderness.     Vascular: Normal carotid pulses. No carotid bruit or JVD.     Trachea: Trachea normal. No tracheal deviation.  Cardiovascular:     Rate and Rhythm: Normal rate and  regular rhythm.     Pulses: No decreased pulses.     Heart sounds: S1 normal and S2 normal. Murmur present. Systolic murmur present with a grade of 2/6.     Comments: Mild systolic murmur heard right upper sternal border. Pulmonary:     Effort: Pulmonary effort is normal. No respiratory distress.     Breath sounds: No wheezing.  Chest:     Chest wall: No tenderness.  Abdominal:     General: Bowel sounds are normal.     Palpations: Abdomen is soft.     Tenderness: There is no abdominal tenderness. There is no guarding or rebound.  Musculoskeletal: Normal range of motion.  Skin:    General: Skin is warm and dry.     Coloration: Skin is not pale.  Neurological:     Mental Status: She is alert.      ED Treatments / Results  Labs (all labs ordered are listed, but only abnormal results are displayed) Labs Reviewed  BASIC METABOLIC PANEL - Abnormal; Notable for the following components:      Result Value   Sodium 133 (*)    Glucose, Bld 116 (*)    BUN 47 (*)    Creatinine, Ser 1.26 (*)    GFR calc non Af Amer 40 (*)    GFR calc Af Amer 46 (*)    All other components within normal limits  CBC - Abnormal; Notable for the following components:   RBC 3.63 (*)    Hemoglobin 10.9 (*)    HCT 33.4 (*)    All other components within normal limits  BRAIN NATRIURETIC PEPTIDE  HEPATIC FUNCTION PANEL  LIPASE, BLOOD  BASIC METABOLIC PANEL  TROPONIN I (HIGH SENSITIVITY)  TROPONIN I (HIGH SENSITIVITY)    EKG EKG Interpretation  Date/Time:  Monday July 23 2019 11:16:12 EST Ventricular Rate:  61 PR Interval:    QRS Duration: 90 QT Interval:  422 QTC Calculation: 424 R Axis:   8 Text Interpretation: Atrial fibrillation Cannot rule out Anterior infarct , age undetermined Abnormal ECG T wave inversion AVL new since 2019 Confirmed by Sherwood Gambler [phone]) on 07/23/2019 3:14:54 PM   Radiology Dg Chest 2 View  Result Date: 07/23/2019 CLINICAL DATA:  Chest pain and shortness  of breath EXAM: CHEST - 2 VIEW COMPARISON:  February 27, 2019 chest radiograph and chest CT September 05, 2018 FINDINGS: Interstitium is mildly prominent but stable. There is no frank edema or consolidation. Heart is upper normal in size with pulmonary vascularity normal. There is aortic atherosclerosis. No adenopathy. There is severe anterior wedging of the T12 vertebral body which was not present on prior CT examination. IMPRESSION: Stable interstitial prominence without edema or consolidation. Stable cardiac silhouette. Aortic Atherosclerosis (ICD10-I70.0). Age uncertain fracture of the T12 vertebral body, not present on prior study from December 2019. Electronically Signed   By: Lowella Grip III M.D.   On: 07/23/2019 12:05    Procedures Procedures (including critical care time)  Medications Ordered in ED Medications  aspirin chewable tablet 324 mg (has no administration in time range)    Or  aspirin suppository 300 mg (has no administration in time range)  aspirin EC tablet 81 mg (has no administration in time range)  nitroGLYCERIN (NITROSTAT) SL tablet 0.4 mg (has no administration in time range)  acetaminophen (TYLENOL) tablet 650 mg (has no administration in time range)  ondansetron (ZOFRAN) injection 4 mg (has no administration in time range)     Initial Impression / Assessment and Plan / ED Course  I have reviewed the triage vital signs and the nursing notes.  Pertinent labs & imaging results that were available during my care of the patient were reviewed by me and considered in my medical decision making (see chart for details).        Patient seen and examined.  Triage labs are reassuring.  Patient will need second troponin.  Given epigastric pain added CMP, lipase.  She [place] need imaging of her chest or cardiology evaluation.  Vital signs reviewed and are as follows: BP (!) 161/53 (BP Location: Right Arm)   Pulse (!) 56   Temp 97.8 F (36.6 C) (Oral)   Resp 18   SpO2 96%    Patient discussed with Dr. Ralene Bathe.  Cardiology has seen patient and will admit for observation with plans for coronary CTA in the morning.  Final Clinical Impressions(s) / ED Diagnoses   Final diagnoses:  Precordial chest pain   Admit.   ED Discharge Orders    None       Carlisle Cater, Hershal Coria 07/23/19 1747    Quintella Reichert, MD 07/24/19 [phone]

## 2019-07-23 NOTE — H&P (Addendum)
Cardiology Admission History and Physical:   Patient ID: [name] MRN: [id number]; DOB: 31-Mar-1935   Admission date: 07/23/2019  Primary Care Provider: [name], MD Primary Cardiologist: Fransico Him, MD  Primary Electrophysiologist:  None   Chief Complaint:  Chest pain  Patient Profile:   [name] is a 84 y.o. female with a history of Takotsubo cardiomyopathy in 2016 with recovered EF, nonobstructive CAD, hypertension, chronic diastolic heart failure, pulmonary hypertension, mild AS and permanent atrial fibrillation.  She is on chronic anticoagulation with Eliquis.  Her echocardiogram in 08/2018 showed normal LV EF and mild aortic stenosis.  History of Present Illness:   [name] past medical history as above.  She has a history of Takotsubo cardiomyopathy in 2016 with EF down to 35-40%.  LHC at that time showed minor nonobstructive CAD. She was seen for syncope in June of this year and was found to be bradycardic with heart rates in the 40s, afib.  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  [name] tells me that she developed chest pains on Saturday.  This is a quick grabbing sensation in her lower chest that shoots through to her back and is intermittent.  She took 4 sublingual nitroglycerin on Saturday which she says was the only thing that gave her some relief.  She took 1 sublingual nitroglycerin yesterday and 2 today.  She denies any associated shortness of breath, nausea or other symptoms.  She has never had this type of pain in the past.  She denies any recent orthopnea.  She has trace lower extremity edema.  She does note that she experiences this pain along with some feeling of being worn out when she is cooking and washing dishes.  The pain is not reproducible but with palpation or certain movements.  She has occasional lightheadedness with walking but no syncope or falls.  The patient reports compliance with all of her  medications.  She states that she did have some sinus drainage and spitting up phlegm about a week ago.  She was prescribed 6 days of antibiotics (possibly Z-Pak) and prednisone which have now been completed.    Heart Pathway Score:     Past Medical History:  Diagnosis Date  . Allergy    rhinitis  . Aortic stenosis    mild by echo 06/2017  . Arthritis   . Bradycardia    a. 10/2017 -> beta blocker cut back due to HR 39.  . Breast cancer ([place]) 01/06/2012  . Cancer [facility])    right colon and left breast  . Chronic diastolic CHF (congestive heart failure) ([place])   . Colon cancer ([place]) 01/06/2012  . Colovesical fistula    Dr. Marlou Starks and [name] planning surgery 08/2018- surgery revealed spontaneous closure  . COPD (chronic obstructive pulmonary disease) ([place])    pt. denies  . Coronary artery disease 2006   a.  NSTEMI in 2016, cath showed 15% prox-mid RCA, 20% prox LAD, EF 25-35% by cath and 35-40% -> felt due to Takotsubo cardiomyopathy.  . Dilated aortic root ([place])    63mmHg by echo 06/2017  . Diverticulosis   . Dyspnea   . Edema extremities   . GERD (gastroesophageal reflux disease)   . Hernia   . Hiatal hernia    denies  . Hyperlipidemia   . Hypertension   . Mild aortic stenosis    echo 11/2015 but not noted on echo 06/2016  . Osteopenia   .  Permanent atrial fibrillation    chronic atrial fibrillation  . Pneumonia    hx child  . Pulmonary HTN ([place])    a. moderate to severe PASP 48mmHg echo 11/2015 - now 36mmHg by echo 06/2017. CTA chest in 11/16 with no PE. PFTs in 7/15 with mild obstructive lung disease. She had a negative sleep study in 2017. b. Felt due to left sided HF.  Marland Kitchen Stroke ([place])   . Takotsubo syndrome 07/29/2015   a. EF 35-40% by echo; akinesis of mid-apical anteroseptal and apical myocardium.  EF now normalized on echo 11/2015    Past Surgical History:  Procedure Laterality Date  . APPENDECTOMY    . BREAST SURGERY     lumpectomy left  . CARDIAC CATHETERIZATION     . CARDIAC CATHETERIZATION N/A 07/28/2015   Procedure: Left Heart Cath and Coronary Angiography;  Surgeon: Peter M Martinique, MD;  Location: [place] [facility];  Service: Cardiovascular;  Laterality: N/A;  . CHOLECYSTECTOMY    . COLECTOMY     right side  . CYSTOSCOPY WITH STENT PLACEMENT Left 09/04/2018   Procedure: CYSTOSCOPY WITH LEFT STENT PLACEMENT, BLADDER REPAIR, CYSTOSCOPY WITH LEFT STENT REMOVAL;  Surgeon: Raynelle Bring, MD;  Location: [facility];  Service: Urology;  Laterality: Left;  . EXCISION OF ACCESSORY NIPPLE Bilateral 05/30/2013   Procedure: BILATERAL NIPPLE BIOPSY;  Surgeon: Merrie Roof, MD;  Location: [place];  Service: General;  Laterality: Bilateral;  . EYE SURGERY Bilateral 12   cataracts  . IR RADIOLOGIST EVAL & MGMT  07/26/2017  . LAPAROSCOPIC RIGHT COLECTOMY N/A 09/04/2018   Procedure: LAPAROSCOPIC ASSISTED SIGMOID COLECTOMY WITH REPAIR OF FISTULA TO BLADDER;  Surgeon: Jovita Kussmaul, MD;  Location: [facility];  Service: General;  Laterality: N/A;  . SPLIT NIGHT STUDY  02/02/2016         Medications Prior to Admission: Prior to Admission medications   Medication Sig Start Date End Date Taking? Authorizing Provider  acetaminophen (TYLENOL) 500 MG tablet Take 500-1,000 mg by mouth every 6 (six) hours as needed for headache (pain).    [provider]  albuterol (VENTOLIN HFA) 108 (90 Base) MCG/ACT inhaler Inhale 2 puffs into the lungs every 6 (six) hours as needed for wheezing or shortness of breath. 07/16/19   [name], MD  amLODipine (NORVASC) 5 MG tablet Take 1.5 tablets (7.5 mg total) by mouth daily. 09/28/18 09/28/19  [name], MD  augmented betamethasone dipropionate (DIPROLENE-AF) 0.05 % ointment Apply 1 application topically 2 (two) times daily as needed (for skin irritation/rashes.).  08/02/18   [provider]  azithromycin (ZITHROMAX) 250 MG tablet 2 tabs poqday1, 1 tab poqday 2-5 07/16/19   [name], MD  clonazePAM (KLONOPIN) 0.5  MG tablet TAKE 1 TABLET BY MOUTH TWICE DAILY AS NEEDED FOR ANXIETY OR SLEEP. 03/22/19   [name], MD  cloNIDine (CATAPRES) 0.1 MG tablet Take 1 tablet by mouth twice daily 07/11/19   [name], MD  ELIQUIS 5 MG TABS tablet Take 1 tablet by mouth twice daily 12/11/18   [name], MD  famotidine (PEPCID) 20 MG tablet One at bedtime 10/26/18   Tanda Rockers, MD  Guaifenesin [facility] MAXIMUM STRENGTH) 1200 MG TB12 Take 1,200 mg by mouth daily as needed (congestion).    [provider]  losartan (COZAAR) 100 MG tablet Take 1 tablet (100 mg total) by mouth daily. 02/12/19   [name], MD  LUBRICANT EYE DROPS 0.4-0.3 %  SOLN Place 1 drop into both eyes 3 (three) times daily as needed (for dry/irritated eyes.).    [provider]  nitroGLYCERIN (NITROSTAT) 0.4 MG SL tablet DISSOLVE ONE TABLET UNDER THE TONGUE EVERY 5 MINUTES AS NEEDED FOR CHEST PAIN.  DO NOT EXCEED A TOTAL OF 3 DOSES IN 15 MINUTES 02/12/19   [name], MD  predniSONE (DELTASONE) 20 MG tablet 3 tabs poqday 1-2, 2 tabs poqday 3-4, 1 tab poqday 5-6 07/16/19   [name], MD  spironolactone (ALDACTONE) 25 MG tablet Take 1/2 (one-half) tablet by mouth once daily 12/11/18   Turner, Eber Hong, MD  tiZANidine (ZANAFLEX) 4 MG tablet Take 1 tablet (4 mg total) by mouth every 6 (six) hours as needed for muscle spasms. 04/06/19   [name], MD  torsemide (DEMADEX) 20 MG tablet Take 3 tablets by mouth once daily 05/30/19   [name], MD     Allergies:    Allergies  Allergen Reactions  . Contrast Media [Iodinated Diagnostic Agents] Hives and Other (See Comments)    Per pt strong burning sensation starting in chest radiating outward   . Clonidine Derivatives Other (See Comments)    Throat dry  . Statins Other (See Comments)    "bones hurt"  . Sulfa Antibiotics Diarrhea    Tremors   . Celebrex [Celecoxib] Rash  . Isosorbide Nitrate Itching and Rash  . Other Itching and Rash    Plastic  and paper tape and heart monitor pads  . Tape Itching and Rash    Red Where applied and will spread    Social History:   Social History   Socioeconomic History  . Marital status: Married    Spouse name: Not on file  . Number of children: Not on file  . Years of education: Not on file  . Highest education level: Not on file  Occupational History  . Not on file  Social Needs  . Financial resource strain: Not on file  . Food insecurity    Worry: Not on file    Inability: Not on file  . Transportation needs    Medical: Not on file    Non-medical: Not on file  Tobacco Use  . Smoking status: Never Smoker  . Smokeless tobacco: Never Used  Substance and Sexual Activity  . Alcohol use: No  . Drug use: No  . Sexual activity: Not Currently  Lifestyle  . Physical activity    Days per week: Not on file    Minutes per session: Not on file  . Stress: Not on file  Relationships  . Social Herbalist on phone: Not on file    Gets together: Not on file    Attends religious service: Not on file    Active member of club or organization: Not on file    Attends meetings of clubs or organizations: Not on file    Relationship status: Not on file  . Intimate partner violence    Fear of current or ex partner: Not on file    Emotionally abused: Not on file    Physically abused: Not on file    Forced sexual activity: Not on file  Other Topics Concern  . Not on file  Social History Narrative  . Not on file    Family History:  The patient's family history includes Cancer in her sister and sister; Heart attack in her mother; Heart disease in her mother; Heart disease (age of  onset: 47) in her brother; Hypertension in her father. There is no history of Stroke.    ROS:  Please see the history of present illness.  All other ROS reviewed and negative.     Physical Exam/Data:   Vitals:   07/23/19 1106 07/23/19 1253 07/23/19 1443  BP: (!) 151/46 (!) 141/50 (!) 161/53  Pulse: 61  (!) 55 (!) 56  Resp: 18 16 18   Temp: 97.7 F (36.5 C) 97.8 F (36.6 C) 97.8 F (36.6 C)  TempSrc: Oral Oral Oral  SpO2: 96% 97% 96%   No intake or output data in the 24 hours ending 07/23/19 1641 Last 3 Weights 04/06/2019 03/27/2019 03/08/2019  Weight (lbs) 185 lb 183 lb 186 lb  Weight (kg) 83.915 kg 83.008 kg 84.369 kg     There is no height or weight on file to calculate BMI.  General:  Well nourished, well developed, in no acute distress HEENT: normal Lymph: no adenopathy Neck: no JVD Endocrine:  No thryomegaly Vascular: No carotid bruits; FA pulses 2+ bilaterally without bruits  Cardiac:  normal S1, S2; mildly irregular rhythm; no murmur  Lungs:  clear to auscultation bilaterally, no wheezing, rhonchi or rales  Abd: soft, nontender, no hepatomegaly  Ext: Trace lower leg edema Musculoskeletal:  No deformities, BUE and BLE strength normal and equal Skin: warm and dry  Neuro:  CNs 2-12 intact, no focal abnormalities noted Psych:  Normal affect    EKG:  The ECG that was done was personally reviewed and demonstrates atrial fibrillation, 61 bpm, late R wave progression.  No significant change from prior EKGs.  Relevant CV Studies:  Outpatient heart monitor 04/11/2019 Study Highlights   Atrial fibrillation with intermittent slo ventricular response. The average heart rate was 51bpm and the heart rate ranged from 30-96bpm.  Occasional PVCs  Idioventricular rhythm transient at 9:30pm.   Limited echocardiogram 09/08/2018 Study Conclusions  - Left ventricle: The cavity size was normal. Systolic function was   normal. The estimated ejection fraction was in the range of 60%   to 65%. Wall motion was normal; there were no regional wall   motion abnormalities. The study is not technically sufficient to   allow evaluation of LV diastolic function. - Aortic valve: Trileaflet; moderately thickened, moderately   calcified leaflets. - Mitral valve: Calcified annulus. There was mild  regurgitation. - Left atrium: The atrium was mildly dilated. - Right ventricle: The cavity size was mildly dilated. Wall   thickness was normal. - Right atrium: The atrium was moderately dilated. - Tricuspid valve: There was mild regurgitation. - Pulmonary arteries: Systolic pressure was moderately increased.   PA peak pressure: 62 mm Hg (S).  Echocardiogram 08/21/2018 Study Conclusions  - Left ventricle: The cavity size was normal. There was moderate   concentric hypertrophy. Systolic function was normal. The   estimated ejection fraction was in the range of 60% to 65%. Wall   motion was normal; there were no regional wall motion   abnormalities. Features are consistent with a pseudonormal left   ventricular filling pattern, with concomitant abnormal relaxation   and increased filling pressure (grade 2 diastolic dysfunction). - Aortic valve: Trileaflet; moderately thickened, severely   calcified leaflets. There was mild stenosis. There was no   regurgitation. Mean gradient (S): 11 mm Hg. Peak gradient (S): 22   mm Hg. - Ascending aorta: The ascending aorta was normal in size measuring   39 mm. - Mitral valve: Calcified annulus. Mildly thickened leaflets .   There  was mild regurgitation. - Left atrium: The atrium was moderately dilated. - Right atrium: The atrium was moderately dilated. - Tricuspid valve: There was moderate regurgitation. - Pulmonary arteries: Systolic pressure was moderately increased.   PA peak pressure: 53 mm Hg (S). - Inferior vena cava: The vessel was dilated. The respirophasic   diameter changes were blunted (< 50%), consistent with elevated   central venous pressure. - Pericardium, extracardiac: There was no pericardial effusion.   Left Heart Cath and Coronary Angiography 07/08/2015  Conclusion   Prox RCA to Mid RCA lesion, 15% stenosed.  Prox LAD lesion, 20% stenosed.  There is severe left ventricular systolic dysfunction.   1. Minor  nonobstructive CAD 2. Severe left ventriclar dysfunction. Wall motion abnormality consistent with Takotsubo cardiomyopathy.  Plan: medical management.        Laboratory Data:  High Sensitivity Troponin:   Recent Labs  Lab 07/23/19 1145  TROPONINIHS 13      Chemistry Recent Labs  Lab 07/23/19 1145  NA 133*  K 4.0  CL 98  CO2 23  GLUCOSE 116*  BUN 47*  CREATININE 1.26*  CALCIUM 9.4  GFRNONAA 40*  GFRAA 46*  ANIONGAP 12    No results for input(s): PROT, ALBUMIN, AST, ALT, ALKPHOS, BILITOT in the last 168 hours. Hematology Recent Labs  Lab 07/23/19 1145  WBC 8.1  RBC 3.63*  HGB 10.9*  HCT 33.4*  MCV 92.0  MCH 30.0  MCHC 32.6  RDW 14.5  PLT 295   BNP Recent Labs  Lab 07/23/19 1128  BNP 66.5    DDimer No results for input(s): DDIMER in the last 168 hours.   Radiology/Studies:  Dg Chest 2 View  Result Date: 07/23/2019 CLINICAL DATA:  Chest pain and shortness of breath EXAM: CHEST - 2 VIEW COMPARISON:  February 27, 2019 chest radiograph and chest CT September 05, 2018 FINDINGS: Interstitium is mildly prominent but stable. There is no frank edema or consolidation. Heart is upper normal in size with pulmonary vascularity normal. There is aortic atherosclerosis. No adenopathy. There is severe anterior wedging of the T12 vertebral body which was not present on prior CT examination. IMPRESSION: Stable interstitial prominence without edema or consolidation. Stable cardiac silhouette. Aortic Atherosclerosis (ICD10-I70.0). Age uncertain fracture of the T12 vertebral body, not present on prior study from December 2019. Electronically Signed   By: Lowella Grip III M.D.   On: 07/23/2019 12:05    Assessment and Plan:   Chest pain, atypical -Patient with new onset of intermittent, brief, grabbing pain in the lower chest that goes through to her back.  No associated shortness of breath or nausea. -BNP normal at 66.5.  High-sensitivity troponin 13.  Other labs are  unremarkable except for mildly elevated serum creatinine of 1.26.  The patient is also anemic with hemoglobin of 10.9 which appears to be at her baseline. -EKG is without acute ischemic changes. -Follow up with second high-sensitivity troponin. -We will admit the patient overnight with plan for evaluation of coronary arteries by coronary CTA tomorrow. -We will hold Eliquis and start heparin. -If CTA shows significant coronary disease, [place] need cardiac catheterization.  If coronary CTA shows no active process, could possibly discharge home.   Permanent atrial fibrillation -Patient was taken off beta-blocker earlier this year due to bradycardia. -She continues on Eliquis for anticoagulation for stroke risk reduction. -Chest x-ray with interstitial prominence without edema or consolidation.  She appears to have an age uncertain fracture of the T12 vertebral body, not present  on prior study in December 2019.  Hypertension -On amlodipine 7.5 mg daily, clonidine 0.1 mg twice daily, losartan 100 mg daily, spironolactone 12.5 mg daily and torsemide 60 mg daily. -Blood pressure is currently elevated 161/53  Bradycardia -Outpatient heart monitor and 04/2019 showed A. fib with intermittent slow ventricular response, average heart rate 51 bpm and heart rate range 30-96 bpm.  Aortic stenosis -Mild by echocardiogram in 08/2018.  Nonobstructive CAD -Minor nonobstructive CAD by cardiac cath in 2016, proximal LAD 20% stenosed, proximal to mid RCA 15% stenosed.  Chronic diastolic heart failure -On Spironolactone and torsemide.  No longer on beta-blocker due to bradycardia. -BNP is normal at 66.5.  Acute kidney injury -Serum creatinine is elevated at 1.26. -We will recheck BMP in the morning.   Severity of Illness: The appropriate patient status for this patient is OBSERVATION. Observation status is judged to be reasonable and necessary in order to provide the required intensity of service to ensure  the patient's safety. The patient's presenting symptoms, physical exam findings, and initial radiographic and laboratory data in the context of their medical condition is felt to place them at decreased risk for further clinical deterioration. Furthermore, it is anticipated that the patient will be medically stable for discharge from the hospital within 2 midnights of admission. The following factors support the patient status of observation.   " The patient's presenting symptoms include chest pain. " The physical exam findings include normal physical exam except irregularly irregular rhythm.. " The initial radiographic and laboratory data are BNP and troponin.     For questions or updates, please contact [place] Please consult [email or web address] for contact info under        Signed, Daune Perch, NP  07/23/2019 4:41 PM   Agree with note by Daune Perch NP-C  We are asked to see this delightful 84 year old married Caucasian female whose husband is 40 years old and they live together.  She is a patient of Dr. Theodosia Blender.  She has a history of nonobstructive CAD by cath in [id number], diastolic heart failure and permanent A. fib.  She is on Eliquis oral anticoagulation.  She is recently had a sinus infection was placed on prednisone and Zithromax.  She has had several episodes of much responsive chest pain since Saturday including episodes today.  She is currently pain-free.  The pain apparently radiates to her back.  Her exam is benign.  Her EKG shows nonspecific changes.  Enzymes are low.  I favor a noninvasive evaluation initially.  We will keep her n.p.o. after midnight, hold her Eliquis and begin heparin anticoagulation.  We will continue to cycle her enzymes and order coronary CTA tomorrow.  If this is negative we will treat her empirically for noncardiac chest pain, and if positive she will need diagnostic coronary angiography.   Lorretta Harp, M.D., [place], [facility], [place] [address] [place], [place]  [address]  [phone] 07/23/2019 5:34 PM

## 2019-07-23 NOTE — ED Notes (Signed)
Pt ambulatory to bathroom without any problems 

## 2019-07-23 NOTE — Telephone Encounter (Signed)
Pt c/o of Chest Pain: STAT if CP now or developed within 24 hours  1. Are you having CP right now? Not having chest pain now- took 4 Nitroglycerin when it first started and 1 Nitroglycerin yesterday- this morning it is real sore all way through her chest and back  2. Are you experiencing any other symptoms (ex. SOB, nausea, vomiting, sweating)? Was short of breath- not at this time  3. How long have you been experiencing CP? Saturday 4. Is your CP continuous or coming and going? Continue until she  Took Nitroglycerin  5. Have you taken Nitroglycerin? Yed- pt wants to be seen ?

## 2019-07-23 NOTE — ED Triage Notes (Signed)
Pt endorses central CP radiating to the left shoulder blade x 2 days with shob. VSS. Hx of cardiac issues.

## 2019-07-23 NOTE — Telephone Encounter (Signed)
Spoke with patient's son and advised that I do not recommend patient to leave the ED. I advised that the hospital is very busy and that I am sorry she is waiting in the lobby, but it remains our recommendation that patient stay there for evaluation. I advised that I have reviewed the patient's symptoms with one of our providers and he agreed that patient needed to stay there for evaluation. Patient's son verbalized understanding and thanked me for the call.

## 2019-07-23 NOTE — Progress Notes (Addendum)
[place] for Heparin  Indication: chest pain/ACS  Allergies  Allergen Reactions  . Contrast Media [Iodinated Diagnostic Agents] Hives and Other (See Comments)    Per pt strong burning sensation starting in chest radiating outward   . Clonidine Derivatives Other (See Comments)    Throat dry  . Statins Other (See Comments)    "bones hurt"  . Sulfa Antibiotics Diarrhea    Tremors   . Celebrex [Celecoxib] Rash  . Isosorbide Nitrate Itching and Rash  . Other Itching and Rash    Plastic and paper tape and heart monitor pads  . Tape Itching and Rash    Red Where applied and will spread    Patient Measurements: Height: 5\' 7"  (170.2 cm) Weight: 187 lb (84.8 kg) IBW/kg (Calculated) : 61.6 Heparin Dosing Weight: 79.3 kg  Vital Signs: Temp: 97.8 F (36.6 C) (11/16 1443) Temp Source: Oral (11/16 1443) BP: 182/49 (11/16 1730) Pulse Rate: 67 (11/16 1730)  Labs: Recent Labs    07/23/19 1145 07/23/19 1627  HGB 10.9*  --   HCT 33.4*  --   PLT 295  --   CREATININE 1.26*  --   TROPONINIHS 13 15    Estimated Creatinine Clearance: 38.5 mL/min (A) (by C-G formula based on SCr of 1.26 mg/dL (H)).   Medical History: Past Medical History:  Diagnosis Date  . Allergy    rhinitis  . Aortic stenosis    mild by echo 06/2017  . Arthritis   . Bradycardia    a. 10/2017 -> beta blocker cut back due to HR 39.  . Breast cancer ([place]) 01/06/2012  . Cancer [facility])    right colon and left breast  . Chronic diastolic CHF (congestive heart failure) ([place])   . Colon cancer ([place]) 01/06/2012  . Colovesical fistula    Dr. Marlou Starks and [name] planning surgery 08/2018- surgery revealed spontaneous closure  . COPD (chronic obstructive pulmonary disease) ([place])    pt. denies  . Coronary artery disease 2006   a.  NSTEMI in 2016, cath showed 15% prox-mid RCA, 20% prox LAD, EF 25-35% by cath and 35-40% -> felt due to Takotsubo cardiomyopathy.  . Dilated aortic root  ([place])    12mmHg by echo 06/2017  . Diverticulosis   . Dyspnea   . Edema extremities   . GERD (gastroesophageal reflux disease)   . Hernia   . Hiatal hernia    denies  . Hyperlipidemia   . Hypertension   . Mild aortic stenosis    echo 11/2015 but not noted on echo 06/2016  . Osteopenia   . Permanent atrial fibrillation    chronic atrial fibrillation  . Pneumonia    hx child  . Pulmonary HTN ([place])    a. moderate to severe PASP 78mmHg echo 11/2015 - now 5mmHg by echo 06/2017. CTA chest in 11/16 with no PE. PFTs in 7/15 with mild obstructive lung disease. She had a negative sleep study in 2017. b. Felt due to left sided HF.  Marland Kitchen Stroke ([place])   . Takotsubo syndrome 07/29/2015   a. EF 35-40% by echo; akinesis of mid-apical anteroseptal and apical myocardium.  EF now normalized on echo 11/2015    Medications:  (Not in a hospital admission)  Scheduled:  . [START ON 07/24/2019] aspirin EC  81 mg Oral Daily    Assessment: Patient is a 61 yof with hx of takotsubo cardiomyopathy in 2016 with a recovered EF, CAD, CHF, Pulm HTN, and a-fib (  on eliquis). Presents today with chest pain and cardiology wishes for pharmacy to start the patient on a heparin drip for ACS/    Goal of Therapy:  Heparin level 0.3-0.7 units/ml aPTT 66-102s seconds Monitor platelets by anticoagulation protocol: Yes   Plan:  - Patient's last dose of apixaban was on 11/16 @ 0800.  - No heparin bolus for the patient  - Will start a heparin drip @ 950 units/hr on 11/16 @ 2000. - Will dose heparin based on aPTT at this time until aPTT and heparin levels correlate.  - Heparin level 8 hours after start of infusion   - Baseline labs have been ordered to follow during therapy.  - Monitor for s/s of bleeding and CBC daily while on heparin   Duanne Limerick PharmD. BCPS  07/23/2019,6:08 PM

## 2019-07-23 NOTE — Telephone Encounter (Signed)
Son of patient called. Patient went to the ER but has been waiting since this morning. She wants her son to come and pick her up to take her home. Son wants to know what the office thinks

## 2019-07-23 NOTE — Telephone Encounter (Signed)
Call was routed to triage and call was not directly forwarded to the nurses.  Spoke with patient who states she was washing dishes on Saturday and pain started in her back and went through to the front of her chest. States pain resolved after taking NTG. Yesterday, pain began again after cooking breakfast and radiated into chest from her back. She took NTG x 1 and pain was resolved. Reports SOB "at times" and reports soreness at this time. Reports chest tightness when she gets up and walks since Saturday.  Strongly advised patient to go to the ED and she argued that she has been there sitting and waiting with her husband for 8 hours in the past and she does not want to go there. I advised her that care will be delayed if she waits for an appointment at our office. Advised that her symptoms warrant emergency evaluation and continue to encourage her to seek emergency medical attention. She states she has to find someone to be with her husband but eventually agrees to proceed to ED.  Cardiology card master, Wannetta Sender, aware patient has been advised to go to ED.

## 2019-07-24 ENCOUNTER — Encounter (HOSPITAL_COMMUNITY): Payer: Self-pay | Admitting: General Practice

## 2019-07-24 ENCOUNTER — Observation Stay (HOSPITAL_BASED_OUTPATIENT_CLINIC_OR_DEPARTMENT_OTHER): Payer: Medicare Other

## 2019-07-24 ENCOUNTER — Other Ambulatory Visit: Payer: Self-pay

## 2019-07-24 DIAGNOSIS — R079 Chest pain, unspecified: Secondary | ICD-10-CM

## 2019-07-24 DIAGNOSIS — I4821 Permanent atrial fibrillation: Secondary | ICD-10-CM | POA: Diagnosis not present

## 2019-07-24 DIAGNOSIS — I5032 Chronic diastolic (congestive) heart failure: Secondary | ICD-10-CM | POA: Diagnosis not present

## 2019-07-24 DIAGNOSIS — R0789 Other chest pain: Secondary | ICD-10-CM | POA: Diagnosis not present

## 2019-07-24 DIAGNOSIS — K219 Gastro-esophageal reflux disease without esophagitis: Secondary | ICD-10-CM

## 2019-07-24 LAB — BASIC METABOLIC PANEL
Anion gap: 9 (ref 5–15)
BUN: 28 mg/dL — ABNORMAL HIGH (ref 8–23)
CO2: 26 mmol/L (ref 22–32)
Calcium: 9.5 mg/dL (ref 8.9–10.3)
Chloride: 104 mmol/L (ref 98–111)
Creatinine, Ser: 0.93 mg/dL (ref 0.44–1.00)
GFR calc Af Amer: >60 mL/min (ref >60)
GFR calc non Af Amer: 57 mL/min — ABNORMAL LOW (ref >60)
Glucose, Bld: 101 mg/dL — ABNORMAL HIGH (ref 70–99)
Potassium: 4.6 mmol/L (ref 3.5–5.1)
Sodium: 139 mmol/L (ref 135–145)

## 2019-07-24 LAB — CBC
HCT: 32.5 % — ABNORMAL LOW (ref 36.0–46.0)
Hemoglobin: 10.7 g/dL — ABNORMAL LOW (ref 12.0–15.0)
MCH: 30.4 pg (ref 26.0–34.0)
MCHC: 32.9 g/dL (ref 30.0–36.0)
MCV: 92.3 fL (ref 80.0–100.0)
Platelets: 284 10*3/uL (ref 150–400)
RBC: 3.52 MIL/uL — ABNORMAL LOW (ref 3.87–5.11)
RDW: 14.6 % (ref 11.5–15.5)
WBC: 7.1 10*3/uL (ref 4.0–10.5)
nRBC: 0 % (ref 0.0–0.2)

## 2019-07-24 LAB — APTT
aPTT: 43 seconds — ABNORMAL HIGH (ref 24–36)
aPTT: 97 seconds — ABNORMAL HIGH (ref 24–36)

## 2019-07-24 LAB — HEPARIN LEVEL (UNFRACTIONATED): Heparin Unfractionated: >2.2 IU/mL — ABNORMAL HIGH (ref 0.30–0.70)

## 2019-07-24 MED ORDER — FAMOTIDINE 20 MG PO TABS: 20.0000 mg | ORAL_TABLET | Freq: Every day | ORAL | Status: DC

## 2019-07-24 MED ORDER — SPIRONOLACTONE 12.5 MG HALF TABLET: 12.5000 mg | ORAL_TABLET | Freq: Every day | ORAL | Status: DC

## 2019-07-24 MED ORDER — TECHNETIUM TC 99M TETROFOSMIN IV KIT
30.0000 | PACK | Freq: Once | INTRAVENOUS | Status: AC | PRN
  Administered 2019-07-24: 15:00:00 30 via INTRAVENOUS

## 2019-07-24 MED ORDER — ALBUTEROL SULFATE (2.5 MG/3ML) 0.083% IN NEBU: 2.5000 mg | INHALATION_SOLUTION | Freq: Four times a day (QID) | RESPIRATORY_TRACT | Status: DC | PRN

## 2019-07-24 MED ORDER — ALBUTEROL SULFATE HFA 108 (90 BASE) MCG/ACT IN AERS
2.0000 | INHALATION_SPRAY | Freq: Four times a day (QID) | RESPIRATORY_TRACT | Status: DC | PRN
  Filled 2019-07-24: qty 6.7

## 2019-07-24 MED ORDER — CLONAZEPAM 0.5 MG PO TABS
0.5000 mg | ORAL_TABLET | Freq: Every day | ORAL | Status: DC
  Administered 2019-07-24: 11:00:00 0.5 mg via ORAL
  Filled 2019-07-24: qty 1

## 2019-07-24 MED ORDER — CLONIDINE HCL 0.1 MG PO TABS
0.1000 mg | ORAL_TABLET | Freq: Two times a day (BID) | ORAL | Status: DC
  Administered 2019-07-24: 0.1 mg via ORAL
  Filled 2019-07-24 (×2): qty 1

## 2019-07-24 MED ORDER — REGADENOSON 0.4 MG/5ML IV SOLN
0.4000 mg | Freq: Once | INTRAVENOUS | Status: DC
  Filled 2019-07-24: qty 5

## 2019-07-24 MED ORDER — REGADENOSON 0.4 MG/5ML IV SOLN
INTRAVENOUS | Status: AC
  Filled 2019-07-24: qty 5

## 2019-07-24 MED ORDER — AMLODIPINE BESYLATE 5 MG PO TABS
7.5000 mg | ORAL_TABLET | Freq: Every day | ORAL | Status: DC
  Administered 2019-07-24: 11:00:00 7.5 mg via ORAL
  Filled 2019-07-24: qty 2

## 2019-07-24 MED ORDER — POLYETHYL GLYCOL-PROPYL GLYCOL 0.4-0.3 % OP SOLN: 1.0000 [drp] | Freq: Three times a day (TID) | OPHTHALMIC | Status: DC | PRN

## 2019-07-24 MED ORDER — TECHNETIUM TC 99M TETROFOSMIN IV KIT
10.0000 | PACK | Freq: Once | INTRAVENOUS | Status: AC | PRN
  Administered 2019-07-24: 10 via INTRAVENOUS

## 2019-07-24 MED ORDER — GUAIFENESIN ER 600 MG PO TB12: 1200.0000 mg | ORAL_TABLET | Freq: Every day | ORAL | Status: DC | PRN

## 2019-07-24 MED ORDER — LOSARTAN POTASSIUM 50 MG PO TABS
100.0000 mg | ORAL_TABLET | Freq: Every day | ORAL | Status: DC
  Administered 2019-07-24: 100 mg via ORAL
  Filled 2019-07-24: qty 2

## 2019-07-24 NOTE — Progress Notes (Signed)
[place] for Heparin  Indication: chest pain/ACS  Allergies  Allergen Reactions  . Contrast Media [Iodinated Diagnostic Agents] Hives and Other (See Comments)    Per pt strong burning sensation starting in chest radiating outward   . Clonidine Derivatives Other (See Comments)    Throat dry  . Statins Other (See Comments)    "bones hurt"  . Sulfa Antibiotics Diarrhea    Tremors   . Celebrex [Celecoxib] Rash  . Isosorbide Nitrate Itching and Rash  . Other Itching and Rash    Plastic and paper tape and heart monitor pads  . Tape Itching and Rash    Red Where applied and will spread    Patient Measurements: Height: 5\' 6"  (167.6 cm) Weight: 185 lb 3.2 oz (84 kg) IBW/kg (Calculated) : 59.3 Heparin Dosing Weight: 79.3 kg  Vital Signs: Temp: 98.4 F (36.9 C) (11/16 2130) Temp Source: Oral (11/16 2130) BP: 175/60 (11/16 2130) Pulse Rate: 79 (11/16 2130)  Labs: Recent Labs    07/23/19 1145 07/23/19 1627 07/23/19 1951 07/24/19 0405  HGB 10.9*  --   --  10.7*  HCT 33.4*  --   --  32.5*  PLT 295  --   --  284  APTT  --   --  30 43*  HEPARINUNFRC  --   --  >2.20*  --   CREATININE 1.26*  --   --   --   TROPONINIHS 13 15  --   --     Estimated Creatinine Clearance: 37.6 mL/min (A) (by C-G formula based on SCr of 1.26 mg/dL (H)).   Assessment: Patient is a 39 yof with hx of takotsubo cardiomyopathy in 2016 with a recovered EF, CAD, CHF, Pulm HTN, and a-fib (on eliquis). Presents today with chest pain and cardiology wishes for pharmacy to start the patient on a heparin drip for ACS.   Heparin level at baseline >2.2 (due to Eliquis) - utilizing PTT for monitoring until levels correlate. PTT = 43 sec (subtherapeutic) on gtt at 950 units/hr. No issues with line or bleeding reported per RN.   Goal of Therapy:  Heparin level 0.3-0.7 units/ml aPTT 66-102s seconds Monitor platelets by anticoagulation protocol: Yes   Plan:  Increase  heparin to 1200 units/hr Will f/u 8 hr PTT Daily heparin level, PTT, and CBC  Sherlon Handing, PharmD, BCPS 07/24/2019,4:54 AM

## 2019-07-24 NOTE — Progress Notes (Signed)
Order received to discharge patient.  Telemetry monitor removed and CCMD notified.  PIV access x2 removed.  Discharge instructions, follow up, medications and instructions for their use discussed with patient. 

## 2019-07-24 NOTE — Progress Notes (Addendum)
Progress Note  Patient Name: [name] Date of Encounter: 07/24/2019  Primary Cardiologist: Fransico Him, MD   Subjective   Pt continues to report brief episodes of chest pain.  Inpatient Medications    Scheduled Meds: . aspirin EC  81 mg Oral Daily   Continuous Infusions: . heparin 1,150 Units/hr (07/24/19 0510)   PRN Meds: acetaminophen, nitroGLYCERIN, ondansetron (ZOFRAN) IV   Vital Signs    Vitals:   07/23/19 1945 07/23/19 2130 07/24/19 0512 07/24/19 0811  BP: (!) 157/58 (!) 175/60 (!) 172/58 (!) 158/65  Pulse:  79 62 69  Resp: (!) 23 18 20 20   Temp:  98.4 F (36.9 C) 98 F (36.7 C) 97.8 F (36.6 C)  TempSrc:  Oral Oral Oral  SpO2:  98% 97% 94%  Weight:  84 kg 81.9 kg   Height:  5\' 6"  (1.676 m)      Intake/Output Summary (Last 24 hours) at 07/24/2019 0944 Last data filed at 07/24/2019 0912 Gross per 24 hour  Intake 256.09 ml  Output 1000 ml  Net -743.91 ml   Last 3 Weights 07/24/2019 07/23/2019 07/23/2019  Weight (lbs) 180 lb 9.6 oz 185 lb 3.2 oz 187 lb  Weight (kg) 81.92 kg 84.006 kg 84.823 kg      Telemetry    Afib in the 50-60s - Personally Reviewed  ECG    No new tracings - Personally Reviewed  Physical Exam   GEN: elderly female, No acute distress.   Neck: No JVD Cardiac: irregular rhythm, regular rate, soft systolic murmur, rubs, or gallops.  Respiratory: Clear to auscultation bilaterally. GI: Soft, nontender, non-distended  MS: No edema; No deformity. Neuro:  Nonfocal  Psych: Normal affect   Labs    High Sensitivity Troponin:   Recent Labs  Lab 07/23/19 1145 07/23/19 1627  TROPONINIHS 13 15      Chemistry Recent Labs  Lab 07/23/19 1145 07/23/19 1627 07/24/19 0405  NA 133*  --  139  K 4.0  --  4.6  CL 98  --  104  CO2 23  --  26  GLUCOSE 116*  --  101*  BUN 47*  --  28*  CREATININE 1.26*  --  0.93  CALCIUM 9.4  --  9.5  PROT  --  7.9  --   ALBUMIN  --  4.1  --   AST  --  19  --   ALT  --  23  --   ALKPHOS   --  70  --   BILITOT  --  0.8  --   GFRNONAA 40*  --  57*  GFRAA 46*  --  >60  ANIONGAP 12  --  9     Hematology Recent Labs  Lab 07/23/19 1145 07/24/19 0405  WBC 8.1 7.1  RBC 3.63* 3.52*  HGB 10.9* 10.7*  HCT 33.4* 32.5*  MCV 92.0 92.3  MCH 30.0 30.4  MCHC 32.6 32.9  RDW 14.5 14.6  PLT 295 284    BNP Recent Labs  Lab 07/23/19 1128  BNP 66.5     DDimer No results for input(s): DDIMER in the last 168 hours.   Radiology    Dg Chest 2 View  Result Date: 07/23/2019 CLINICAL DATA:  Chest pain and shortness of breath EXAM: CHEST - 2 VIEW COMPARISON:  February 27, 2019 chest radiograph and chest CT September 05, 2018 FINDINGS: Interstitium is mildly prominent but stable. There is no frank edema or consolidation. Heart is upper normal  in size with pulmonary vascularity normal. There is aortic atherosclerosis. No adenopathy. There is severe anterior wedging of the T12 vertebral body which was not present on prior CT examination. IMPRESSION: Stable interstitial prominence without edema or consolidation. Stable cardiac silhouette. Aortic Atherosclerosis (ICD10-I70.0). Age uncertain fracture of the T12 vertebral body, not present on prior study from December 2019. Electronically Signed   By: Lowella Grip III M.D.   On: 07/23/2019 12:05    Cardiac Studies   Echo 09/08/18: Study Conclusions - Left ventricle: The cavity size was normal. Systolic function was   normal. The estimated ejection fraction was in the range of 60%   to 65%. Wall motion was normal; there were no regional wall   motion abnormalities. The study is not technically sufficient to   allow evaluation of LV diastolic function. - Aortic valve: Trileaflet; moderately thickened, moderately   calcified leaflets. - Mitral valve: Calcified annulus. There was mild regurgitation. - Left atrium: The atrium was mildly dilated. - Right ventricle: The cavity size was mildly dilated. Wall   thickness was normal. - Right  atrium: The atrium was moderately dilated. - Tricuspid valve: There was mild regurgitation. - Pulmonary arteries: Systolic pressure was moderately increased.   PA peak pressure: 62 mm Hg (S).  Heart monitor 04/11/19:  Atrial fibrillation with intermittent slo ventricular response. The average heart rate was 51bpm and the heart rate ranged from 30-96bpm.  Occasional PVCs  Idioventricular rhythm transient at 9:30pm.  Patient Profile     84 y.o. female with a history of Takotsubo cardiomyopathy in 2016 with recovered EF, nonobstructive CAD, hypertension, chronic diastolic heart failure, pulmonary hypertension, mild AS and permanent atrial fibrillation.  She is on chronic anticoagulation with Eliquis.  Her echocardiogram in 08/2018 showed normal LV EF and mild aortic stenosis. She was admitted to cardiology 07/23/19 for chest pain concerning for angina.   Assessment & Plan    1. Chest pain - heart cath in 2016 with nonobstructive disease - hs troponin x 2 negative - EKG without signs of ischemia, but Afib - pt continues to describe somewhat atypical chest pain - will proceed with CT coronary - will order 18 gauge PIV in [facility] or higher   2. Permanent atrial fibrillation - rate controlled in the 50-70 - heparin drip running, eliquis on hold  - does not require rate controlling medications   3. Hypertension - home medications include clonidine 0.1 mg BID, amlodipine 5 mg daily, losartan 100 mg, and spironolactone 12.5 mg daiy - medications have been held - pressure is elevated today - restart home medications   4. Hx of Takotsubo 5. Chronic diastolic heart failure - home diuretic is 20 mg torsemide - pt does not appear volume overloaded on exam    For questions or updates, please contact [place] Please consult [email or web address] for contact info under        Signed, Ledora Bottcher, PA  07/24/2019, 9:44 AM    Agree with note by Fabian Sharp PA-C  [name] was  admitted to the ER for atypical chest pain.  She has a history of normal coronary arteries by cath in 2016 at which time she had Takotsubo syndrome.  Her subsequent EF normalized.  Her problems are as listed.  Her pain is midline [place] and fairly quick.  Her enzymes are negative.  Her EKG shows no acute changes in atrial fibrillation with a slow ventricular response.  Her exam is benign.  Plan for CT coronary angiography today.  If normal can be discharged and treated for atypical noncardiac chest pain.  Lorretta Harp, M.D., [place], [facility], Laverta [place]illi[place] [address] [place],   [address]  [phone] 07/24/2019 10:53 AM

## 2019-07-24 NOTE — Discharge Summary (Addendum)
Discharge Summary    Patient ID: [name] MRN: [id number]; DOB: 12/04/1934  Admit date: 07/23/2019 Discharge date: 07/24/2019  Primary Care Provider: [name], MD  Primary Cardiologist: Fransico Him, MD  Primary Electrophysiologist:  None   Discharge Diagnoses    Principal Problem:   Chest pain Active Problems:   GERD (gastroesophageal reflux disease)   Hypertension   COPD (chronic obstructive pulmonary disease) ([place])   Coronary artery disease   Permanent atrial fibrillation (HCC)   Chronic anticoagulation   Chronic diastolic CHF (congestive heart failure) (HCC)   Dyslipidemia, goal LDL below 70    Diagnostic Studies/Procedures    Nuclear stress test 07/24/19:  Defect 1: There is a medium defect of moderate severity.  This is a low risk study.  Nuclear stress EF: 70%.  No T wave inversion was noted during stress.  There was no ST segment deviation noted during stress.   Moderate size and intensity mostly fixed apical, apical septal, apical anterior and apical lateral perfusion defect, worse at rest than stress. No reversible ischemia. Suspect breast attenuation artifact. LVEF 70% with normal wall motion. This is a low risk study. _____________   History of Present Illness     [name] is a 84 y.o. female with a history of Takotsubo cardiomyopathy in 2016 with recovered EF, nonobstructive CAD, hypertension, chronic diastolic heart failure, pulmonary hypertension, mild AS and permanent atrial fibrillation.  She is on chronic anticoagulation with Eliquis.  Her echocardiogram in 08/2018 showed normal LV EF and mild aortic stenosis.  Susan. [name] past medical history as above.  She has a history of Takotsubo cardiomyopathy in 2016 with EF down to 35-40%.  LHC at that time showed minor nonobstructive CAD. She was seen for syncope in June of this year and was found to be bradycardic with heart rates in the 40s, afib.  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  [name] tells me that she developed chest pains on Saturday.  This is a quick grabbing sensation in her lower chest that shoots through to her back and is intermittent.  She took 4 sublingual nitroglycerin on Saturday which she says was the only thing that gave her some relief.  She took 1 sublingual nitroglycerin yesterday 07/22/19 and 2 on 07/23/19.  She denies any associated shortness of breath, nausea or other symptoms.  She has never had this type of pain in the past.  She denies any recent orthopnea.  She has trace lower extremity edema.  She does note that she experiences this pain along with some feeling of being worn out when she is cooking and washing dishes.  The pain is not reproducible but with palpation or certain movements.  She has occasional lightheadedness with walking but no syncope or falls.  The patient reports compliance with all of her medications.  She states that she did have some sinus drainage and spitting up phlegm about a week ago.  She was prescribed 6 days of antibiotics (possibly Z-Pak) and prednisone which have now been completed.   Hospital Course     Consultants: none  Chest pain Nonobstructive disease by heart cath in 2016 [name] was admitted to cardiology for observation of her nitro-responsive chest pain. Eliquis was transitioned to heparin drip. HS troponin x 2 negative (13 --> 15). EKG with ST changes in anterior leads that are stable to prior tracings. She reported overnight continued bouts of quick, grabbing episodes of chest pain. [name]  underwent lexiscan myoview 07/24/19 which revealed no reversible ischemia, with likely breast attenuation artifact. EF estimated at 70% and normal wall motion. Given this low risk study, hr chest pain is likely noncardiac. Plan to discharge home and follow up with PCP.   History of takotsubo with EF of 35-40% [id number] Chronic diastolic heart failure EF had normalized by echo in 2017. She is  maintained on demadex 20 mg.     Hypertension Continue home medicatiosn clonidine 0.1 mg BID, amlodipine 5 mg daily, losartan 100 mg daily, and spironolactone 12.5 mg daily. Pressures better controlled once home medications restarted.   Permanent Afib Precluded evaluation with CT coronary. Eliquis transitioned to heparin drip in anticipation for possible procedure. Does not require rate-controlling medications.    [name] seen and examined by Dr. Gwenlyn Found and deemed stable for discharge. She was advised to follow up with her PCP for her chest pain.     Did the patient have an acute coronary syndrome (MI, NSTEMI, STEMI, etc) this admission?:  No                               Did the patient have a percutaneous coronary intervention (stent / angioplasty)?:  No.   _____________  Discharge Vitals Blood pressure (!) 140/52, pulse 60, temperature 97.7 F (36.5 C), temperature source Oral, resp. rate 18, height 5\' 6"  (1.676 m), weight 81.9 kg, SpO2 98 %.  Filed Weights   07/23/19 1800 07/23/19 2130 07/24/19 0512  Weight: 84.8 kg 84 kg 81.9 kg    Labs & Radiologic Studies    CBC Recent Labs    07/23/19 1145 07/24/19 0405  WBC 8.1 7.1  HGB 10.9* 10.7*  HCT 33.4* 32.5*  MCV 92.0 92.3  PLT 295 [id number]   Basic Metabolic Panel Recent Labs    07/23/19 1145 07/24/19 0405  NA 133* 139  K 4.0 4.6  CL 98 104  CO2 23 26  GLUCOSE 116* 101*  BUN 47* 28*  CREATININE 1.26* 0.93  CALCIUM 9.4 9.5   Liver Function Tests Recent Labs    07/23/19 1627  AST 19  ALT 23  ALKPHOS 70  BILITOT 0.8  PROT 7.9  ALBUMIN 4.1   Recent Labs    07/23/19 1627  LIPASE 51   High Sensitivity Troponin:   Recent Labs  Lab 07/23/19 1145 07/23/19 1627  TROPONINIHS 13 15    BNP Invalid input(s): POCBNP D-Dimer No results for input(s): DDIMER in the last 72 hours. Hemoglobin A1C No results for input(s): HGBA1C in the last 72 hours. Fasting Lipid Panel No results for input(s): CHOL, HDL, LDLCALC,  TRIG, CHOLHDL, LDLDIRECT in the last 72 hours. Thyroid Function Tests No results for input(s): TSH, T4TOTAL, T3FREE, THYROIDAB in the last 72 hours.  Invalid input(s): FREET3 _____________  Dg Chest 2 View  Result Date: 07/23/2019 CLINICAL DATA:  Chest pain and shortness of breath EXAM: CHEST - 2 VIEW COMPARISON:  February 27, 2019 chest radiograph and chest CT September 05, 2018 FINDINGS: Interstitium is mildly prominent but stable. There is no frank edema or consolidation. Heart is upper normal in size with pulmonary vascularity normal. There is aortic atherosclerosis. No adenopathy. There is severe anterior wedging of the T12 vertebral body which was not present on prior CT examination. IMPRESSION: Stable interstitial prominence without edema or consolidation. Stable cardiac silhouette. Aortic Atherosclerosis (ICD10-I70.0). Age uncertain fracture of the T12 vertebral body, not present on prior study from  December 2019. Electronically Signed   By: Lowella Grip III M.D.   On: 07/23/2019 12:05   Nm Myocar Multi W/spect W/wall Motion / Ef  Result Date: 07/24/2019  Defect 1: There is a medium defect of moderate severity.  This is a low risk study.  Nuclear stress EF: 70%.  No T wave inversion was noted during stress.  There was no ST segment deviation noted during stress.  Moderate size and intensity mostly fixed apical, apical septal, apical anterior and apical lateral perfusion defect, worse at rest than stress. No reversible ischemia. Suspect breast attenuation artifact. LVEF 70% with normal wall motion. This is a low risk study.   Disposition   [name] is being discharged home today in good condition.  Follow-up Plans & Appointments    Follow-up Information    [name], MD Follow up in 3 week(s).   Specialty: Cardiology Why: office will call for appt - can be virtual Contact information: [address] [place] [address] [phone]          Discharge  Instructions    Diet - low sodium heart healthy   Complete by: As directed    Increase activity slowly   Complete by: As directed       Discharge Medications   Allergies as of 07/24/2019      Reactions   Contrast Media [iodinated Diagnostic Agents] Hives, Other (See Comments)   Per [name] strong burning sensation starting in chest radiating outward   Clonidine Derivatives Other (See Comments)   Throat dry   Statins Other (See Comments)   "bones hurt"   Sulfa Antibiotics Diarrhea   Tremors    Celebrex [celecoxib] Rash   Isosorbide Nitrate Itching, Rash   Other Itching, Rash   Plastic and paper tape and heart monitor pads   Tape Itching, Rash   Red Where applied and will spread      Medication List    TAKE these medications   acetaminophen 500 MG tablet Commonly known as: TYLENOL Take 500-1,000 mg by mouth every 6 (six) hours as needed for headache (pain).   albuterol 108 (90 Base) MCG/ACT inhaler Commonly known as: VENTOLIN HFA Inhale 2 puffs into the lungs every 6 (six) hours as needed for wheezing or shortness of breath.   amLODipine 5 MG tablet Commonly known as: NORVASC Take 1.5 tablets (7.5 mg total) by mouth daily.   augmented betamethasone dipropionate 0.05 % ointment Commonly known as: DIPROLENE-AF Apply 1 application topically 2 (two) times daily as needed (for skin irritation/rashes.).   clonazePAM 0.5 MG tablet Commonly known as: KLONOPIN TAKE 1 TABLET BY MOUTH TWICE DAILY AS NEEDED FOR ANXIETY OR SLEEP. What changed: See the new instructions.   cloNIDine 0.1 MG tablet Commonly known as: CATAPRES Take 1 tablet by mouth twice daily   Eliquis 5 MG Tabs tablet Generic drug: apixaban Take 1 tablet by mouth twice daily What changed: how much to take   famotidine 20 MG tablet Commonly known as: Pepcid One at bedtime What changed:   how much to take  how to take this  when to take this  additional instructions   losartan 100 MG tablet Commonly  known as: COZAAR Take 1 tablet (100 mg total) by mouth daily.   Lubricant Eye Drops 0.4-0.3 % Soln Generic drug: Polyethyl Glycol-Propyl Glycol Place 1 drop into both eyes 3 (three) times daily as needed (for dry/irritated eyes.).   Mucinex Maximum Strength 1200 MG Tb12 Generic drug:  Guaifenesin Take 1,200 mg by mouth daily as needed (congestion).   nitroGLYCERIN 0.4 MG SL tablet Commonly known as: NITROSTAT DISSOLVE ONE TABLET UNDER THE TONGUE EVERY 5 MINUTES AS NEEDED FOR CHEST PAIN.  DO NOT EXCEED A TOTAL OF 3 DOSES IN 15 MINUTES What changed:   how much to take  how to take this  when to take this  reasons to take this  additional instructions   spironolactone 25 MG tablet Commonly known as: ALDACTONE Take 1/2 (one-half) tablet by mouth once daily What changed: See the new instructions.   tiZANidine 4 MG tablet Commonly known as: Zanaflex Take 1 tablet (4 mg total) by mouth every 6 (six) hours as needed for muscle spasms.   torsemide 20 MG tablet Commonly known as: DEMADEX Take 3 tablets by mouth once daily          Outstanding Labs/Studies   none  Duration of Discharge Encounter   Greater than 30 minutes including physician time.  Signed, Tami Lin Duke, PA 07/24/2019, 4:03 PM   Agree with note by Fabian Sharp PA-C  Lexiscan nl. Nl EF. Non cardiac CP. OK for DC home. ROV with Dr Louis Meckel, M.D., [place], [facility], [place] [address] [place], [place]  [address]  [phone] 07/24/2019 4:47 PM

## 2019-08-06 ENCOUNTER — Ambulatory Visit (INDEPENDENT_AMBULATORY_CARE_PROVIDER_SITE_OTHER): Payer: Medicare Other | Admitting: Family Medicine

## 2019-08-06 ENCOUNTER — Other Ambulatory Visit: Payer: Self-pay

## 2019-08-06 ENCOUNTER — Other Ambulatory Visit: Payer: Self-pay | Admitting: Family Medicine

## 2019-08-06 ENCOUNTER — Encounter: Payer: Self-pay | Admitting: Family Medicine

## 2019-08-06 VITALS — BP 180/60 | HR 78 | Temp 97.3°F | Resp 16 | Ht 67.0 in | Wt 186.0 lb

## 2019-08-06 DIAGNOSIS — R0789 Other chest pain: Secondary | ICD-10-CM

## 2019-08-06 DIAGNOSIS — K5792 Diverticulitis of intestine, part unspecified, without perforation or abscess without bleeding: Secondary | ICD-10-CM

## 2019-08-06 DIAGNOSIS — F064 Anxiety disorder due to known physiological condition: Secondary | ICD-10-CM

## 2019-08-06 DIAGNOSIS — M542 Cervicalgia: Secondary | ICD-10-CM | POA: Diagnosis not present

## 2019-08-06 DIAGNOSIS — I10 Essential (primary) hypertension: Secondary | ICD-10-CM

## 2019-08-06 MED ORDER — DICLOFENAC SODIUM 1 % EX GEL: 2.0000 g | Freq: Four times a day (QID) | CUTANEOUS | 0 refills | Status: DC

## 2019-08-06 NOTE — Telephone Encounter (Signed)
Ok to refill??  Last office visit 07/16/2019.  Last refill 03/22/2019, #1 refills.

## 2019-08-07 ENCOUNTER — Other Ambulatory Visit (HOSPITAL_COMMUNITY): Payer: Medicare Other

## 2019-08-07 NOTE — Progress Notes (Signed)
Subjective:    Patient ID: [name], female    DOB: July 08, 1935, 84 y.o.   MRN: [id number]  HPI Patient [name] recently admitted to the hospital with chest pain.  I have copied relevant portions of the discharge summary below for my reference:  Admit date: 07/23/2019 Discharge date: 07/24/2019  Primary Care Provider: [name], MD  Primary Cardiologist: Fransico Him, MD  Primary Electrophysiologist:  None   Discharge Diagnoses    Principal Problem:   Chest pain Active Problems:   GERD (gastroesophageal reflux disease)   Hypertension   COPD (chronic obstructive pulmonary disease) ([place])   Coronary artery disease   Permanent atrial fibrillation (HCC)   Chronic anticoagulation   Chronic diastolic CHF (congestive heart failure) ([place])   Dyslipidemia, goal LDL below 70    Diagnostic Studies/Procedures    Nuclear stress test 07/24/19:  Defect 1: There is a medium defect of moderate severity.  This is a low risk study.  Nuclear stress EF: 70%.  No T wave inversion [name] noted during stress.  There [name] no ST segment deviation noted during stress.  Moderate size and intensity mostly fixed apical, apical septal, apical anterior and apical lateral perfusion defect, worse at rest than stress. No reversible ischemia. Suspect breast attenuation artifact. LVEF 70% with normal wall motion. This is a low risk study. _____________   History of Present Illness     [name] is a 84 y.o. female with a history of Takotsubo cardiomyopathy in 2016 with recovered EF, nonobstructive CAD, hypertension, chronic diastolic heart failure, pulmonary hypertension, mild AS and permanent atrial fibrillation. She is on chronic anticoagulation with Eliquis. Her echocardiogram in 08/2018 showed normal LV EF and mild aortic stenosis.  [name] medical history as above. She has a history of Takotsubo cardiomyopathy in 2016 with EF down to 35-40%. LHC at that time showed minor  nonobstructive CAD. She [name] seen for syncope in June of this year and [name] found to be bradycardic with heart rates in the 40s, afib. 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  [name] that she developed chest pains on Saturday. This is a quick grabbing sensation in her lower chest that shoots through to her back and is intermittent. She took 4 sublingual nitroglycerin on Saturday which she says [name] the only thing that gave her some relief. She took 1 sublingual nitroglycerin yesterday 07/22/19 and 2 on 07/23/19. She denies any associated shortness of breath,nausea or other symptoms. She has never had this type of pain in the past. She denies any recent orthopnea. She has trace lower extremity edema. She does note that she experiences this pain along with some feeling of being worn out when she is cooking and washing dishes. The pain is not reproducible but with palpation or certain movements. She has occasional lightheadedness with walking but no syncope or falls.  The patient reports compliance with all of her medications. She states that she did have some sinus drainage and spitting up phlegm about a week ago. She [name] prescribed 6 days of antibiotics (possibly Z-Pak)and prednisone which have now been completed.  Hospital Course     Consultants: none  Chest pain Nonobstructive disease by heart cath in 2016 [name] admitted to cardiology for observation of her nitro-responsive chest pain. Eliquis [name] transitioned to heparin drip. HS troponin x 2 negative (13 --> 15). EKG with ST changes in anterior leads that are stable to prior tracings. She reported overnight  continued bouts of quick, grabbing episodes of chest pain. Pt underwent lexiscan myoview 07/24/19 which revealed no reversible ischemia, with likely breast attenuation artifact. EF estimated at 70% and normal wall motion. Given this low risk study, hr chest pain is likely  noncardiac. Plan to discharge home and follow up with PCP.   History of takotsubo with EF of 35-40% [name] Chronic diastolic heart failure EF had normalized by echo in 2017. She is maintained on demadex 20 mg.     Hypertension Continue home medicatiosn clonidine 0.1 mg BID, amlodipine 5 mg daily, losartan 100 mg daily, and spironolactone 12.5 mg daily. Pressures better controlled once home medications restarted.   Permanent Afib Precluded evaluation with CT coronary. Eliquis transitioned to heparin drip in anticipation for possible procedure. Does not require rate-controlling medications.    Pt seen and examined by Dr. Gwenlyn Found and deemed stable for discharge. She [name] advised to follow up with her PCP for her chest pain.   08/06/19 Patient is here today for follow-up.  Her blood pressure is extremely high today.  However the patient has forgot to take any of her medication.  It is 4:00 in the afternoon.  He plans on taking all of her medication as soon as she gets home.  Patient states that since discharge from the hospital she has had no further chest pain.  She describes the pain as a sudden "thump" in the center of her chest.  It would last a second and then go away.  It would come at random.  It would only last a brief moment and then pass.  She has had no further episodes since discharge from the hospital.  Stress test in the hospital [name] low risk.  Chest x-ray revealed no abnormalities aside from a possible T12 compression fracture that [name] new compared to December 2019 however this is not the location of her pain and in fact the patient reports no pain in the center of her back.  However since discharge from the hospital she has developed right ear pain.  This began Friday.  It lasted through the weekend.  The pain is actually below her right ear near the angle of the mandible.  She is tender to palpation in that area and winces every time I touch that area.  The pain does not  radiate.  There is no exacerbating or alleviating factors other than touching the area.  There is no lymphadenopathy in that area.  There is no palpable mass in that area.  She seems to be tender over the sternocleidomastoid muscle near its insertion on the mastoid process. Past Medical History:  Diagnosis Date   Allergy    rhinitis   Aortic stenosis    mild by echo 06/2017   Arthritis    Bradycardia    a. 10/2017 -> beta blocker cut back due to HR 39.   Breast cancer ([place]) 01/06/2012   Cancer ([place])    right colon and left breast   Chronic diastolic CHF (congestive heart failure) ([place])    Colon cancer ([place]) 01/06/2012   Colovesical fistula    Dr. Marlou Starks and [name] planning surgery 08/2018- surgery revealed spontaneous closure   COPD (chronic obstructive pulmonary disease) ([place])    pt. denies   Coronary artery disease 2006   a.  NSTEMI in 2016, cath showed 15% prox-mid RCA, 20% prox LAD, EF 25-35% by cath and 35-40% -> felt due to Takotsubo cardiomyopathy.   Dilated aortic root ([place])  95mmHg by echo 06/2017   Diverticulosis    Dyspnea    Edema extremities    GERD (gastroesophageal reflux disease)    Hernia    Hiatal hernia    denies   Hyperlipidemia    Hypertension    Mild aortic stenosis    echo 11/2015 but not noted on echo 06/2016   Osteopenia    Permanent atrial fibrillation (HCC)    chronic atrial fibrillation   Pneumonia    hx child   Pulmonary HTN ([name])    a. moderate to severe PASP 34mmHg echo 11/2015 - now 50mmHg by echo 06/2017. CTA chest in 11/16 with no PE. PFTs in 7/15 with mild obstructive lung disease. She had a negative sleep study in 2017. b. Felt due to left sided HF.   Stroke [facility])    Takotsubo syndrome 07/29/2015   a. EF 35-40% by echo; akinesis of mid-apical anteroseptal and apical myocardium.  EF now normalized on echo 11/2015   Past Surgical History:  Procedure Laterality Date   APPENDECTOMY     BREAST SURGERY      lumpectomy left   CARDIAC CATHETERIZATION     CARDIAC CATHETERIZATION N/A 07/28/2015   Procedure: Left Heart Cath and Coronary Angiography;  Surgeon: Peter M Martinique, MD;  Location: [facility];  Service: Cardiovascular;  Laterality: N/A;   CHOLECYSTECTOMY     COLECTOMY     right side   CYSTOSCOPY WITH STENT PLACEMENT Left 09/04/2018   Procedure: CYSTOSCOPY WITH LEFT STENT PLACEMENT, BLADDER REPAIR, CYSTOSCOPY WITH LEFT STENT REMOVAL;  Surgeon: Raynelle Bring, MD;  Location: [facility];  Service: Urology;  Laterality: Left;   EXCISION OF ACCESSORY NIPPLE Bilateral 05/30/2013   Procedure: BILATERAL NIPPLE BIOPSY;  Surgeon: Merrie Roof, MD;  Location: [facility];  Service: General;  Laterality: Bilateral;   EYE SURGERY Bilateral 12   cataracts   IR RADIOLOGIST EVAL & MGMT  07/26/2017   LAPAROSCOPIC RIGHT COLECTOMY N/A 09/04/2018   Procedure: LAPAROSCOPIC ASSISTED SIGMOID COLECTOMY WITH REPAIR OF FISTULA TO BLADDER;  Surgeon: Autumn Messing III, MD;  Location: [facility];  Service: General;  Laterality: N/A;   SPLIT NIGHT STUDY  02/02/2016       Current Outpatient Medications on File Prior to Visit  Medication Sig Dispense Refill   acetaminophen (TYLENOL) 500 MG tablet Take 500-1,000 mg by mouth every 6 (six) hours as needed for headache (pain).     albuterol (VENTOLIN HFA) 108 (90 Base) MCG/ACT inhaler Inhale 2 puffs into the lungs every 6 (six) hours as needed for wheezing or shortness of breath. 18 g 0   amLODipine (NORVASC) 5 MG tablet Take 1.5 tablets (7.5 mg total) by mouth daily. 135 tablet 3   augmented betamethasone dipropionate (DIPROLENE-AF) 0.05 % ointment Apply 1 application topically 2 (two) times daily as needed (for skin irritation/rashes.).   1   cloNIDine (CATAPRES) 0.1 MG tablet Take 1 tablet by mouth twice daily 180 tablet 2   ELIQUIS 5 MG TABS tablet Take 1 tablet by mouth twice daily 180 tablet 2   famotidine (PEPCID) 20 MG tablet One at bedtime 30 tablet 11    Guaifenesin (MUCINEX MAXIMUM STRENGTH) 1200 MG TB12 Take 1,200 mg by mouth daily as needed (congestion).     losartan (COZAAR) 100 MG tablet Take 1 tablet (100 mg total) by mouth daily. 90 tablet 1   LUBRICANT EYE DROPS 0.4-0.3 % SOLN Place 1 drop into both eyes 3 (three) times daily as needed (for  dry/irritated eyes.).     nitroGLYCERIN (NITROSTAT) 0.4 MG SL tablet DISSOLVE ONE TABLET UNDER THE TONGUE EVERY 5 MINUTES AS NEEDED FOR CHEST PAIN.  DO NOT EXCEED A TOTAL OF 3 DOSES IN 15 MINUTES 25 tablet 0   spironolactone (ALDACTONE) 25 MG tablet Take 1/2 (one-half) tablet by mouth once daily 45 tablet 2   torsemide (DEMADEX) 20 MG tablet Take 3 tablets by mouth once daily 90 tablet 10   No current facility-administered medications on file prior to visit.    Allergies  Allergen Reactions   Contrast Media [Iodinated Diagnostic Agents] Hives and Other (See Comments)    Per pt strong burning sensation starting in chest radiating outward    Clonidine Derivatives Other (See Comments)    Throat dry   Statins Other (See Comments)    "bones hurt"   Sulfa Antibiotics Diarrhea    Tremors    Celebrex [Celecoxib] Rash   Isosorbide Nitrate Itching and Rash   Other Itching and Rash    Plastic and paper tape and heart monitor pads   Tape Itching and Rash    Red Where applied and will spread   Social History   Socioeconomic History   Marital status: Married    Spouse name: Not on file   Number of children: Not on file   Years of education: Not on file   Highest education level: Not on file  Occupational History   Not on file  Social Needs   Financial resource strain: Not on file   Food insecurity    Worry: Not on file    Inability: Not on file   Transportation needs    Medical: Not on file    Non-medical: Not on file  Tobacco Use   Smoking status: Never Smoker   Smokeless tobacco: Never Used  Substance and Sexual Activity   Alcohol use: No   Drug use: No    Sexual activity: Not Currently  Lifestyle   Physical activity    Days per week: Not on file    Minutes per session: Not on file   Stress: Not on file  Relationships   Social connections    Talks on phone: Not on file    Gets together: Not on file    Attends religious service: Not on file    Active member of club or organization: Not on file    Attends meetings of clubs or organizations: Not on file    Relationship status: Not on file   Intimate partner violence    Fear of current or ex partner: Not on file    Emotionally abused: Not on file    Physically abused: Not on file    Forced sexual activity: Not on file  Other Topics Concern   Not on file  Social History Narrative   Not on file      Review of Systems  All other systems reviewed and are negative.      Objective:   Physical Exam Vitals signs reviewed.  Constitutional:      General: She is not in acute distress.    Appearance: Normal appearance. She is not ill-appearing or toxic-appearing.  HENT:     Right Ear: Tympanic membrane, ear canal and external ear normal.     Left Ear: Tympanic membrane, ear canal and external ear normal.     Nose: Nose normal. No congestion.     Mouth/Throat:     Mouth: Mucous membranes are moist.  Pharynx: Oropharynx is clear. No oropharyngeal exudate or posterior oropharyngeal erythema.  Neck:     Musculoskeletal: Normal range of motion and neck supple. Muscular tenderness present.     Vascular: No carotid bruit.   Cardiovascular:     Rate and Rhythm: Normal rate.  Pulmonary:     Effort: Pulmonary effort is normal. No respiratory distress.     Breath sounds: Normal breath sounds. No wheezing or rales.  Lymphadenopathy:     Cervical: No cervical adenopathy.  Neurological:     Mental Status: She is alert.           Assessment & Plan:  Neck pain  Atypical chest pain  Benign essential HTN  I believe the patient's neck pain is musculoskeletal.  She is  unable to take NSAIDs due to the fact she is on anticoagulation with Eliquis.  I have recommended trying Voltaren gel 2 g 4 times daily to the affected area and heat.  Allow 1 week and see if pain gradually improves.  I believe is most likely muscular or perhaps even related to arthritis in the neck.  There is no palpable abnormality or lymphadenopathy in that area.  Patient's chest pain has since resolved.  Stress test in the hospital [name] negative.  Her description now almost sounds like PVCs.  She has an upcoming echocardiogram scheduled with her cardiologist.  Her blood pressure is very high today however the patient has not taken any of her medication yet.  When she takes her medication her blood pressure is usually well controlled.  I have encouraged the patient to go home and take her medication.  Reassess if neck pain has not improved over the next week

## 2019-08-13 NOTE — Progress Notes (Signed)
Cardiology Office Note    Date:  08/14/2019   ID:  [name], DOB October 25, 1934, MRN [id number]  PCP:  [name], MD  Cardiologist: Fransico Him, MD EPS: None  No chief complaint on file.   History of Present Illness:  [name] is a 84 y.o. female with history of Takotsubo cardiomyopathy EF 35 to 40% in 2016 with recovered EF, nonobstructive CAD on cath in 2016, hypertension, chronic diastolic CHF, pulmonary hypertension, mild aortic stenosis, permanent atrial fibrillation on Eliquis.  History of bradycardia 02/2019 at which time metoprolol was discontinued  Discharge from the hospital 07/24/2019 her observation for chest pain felt to be atypical.  Troponins negative, EKG unchanged.  Lexiscan Myoview 07/24/2019 no reversible ischemia likely breast attenuation artifact EF 70%.  Patient saw PCP 08/06/2019 with neck pain he felt was musculoskeletal.  He recommended Voltaren since she is on Eliquis.  Blood pressure was quite high that day but she had not taken her medications yet.  Patient says the voltaren cream made her sneeze but neck feeling a little better. Constant pain with ear ache. Improving. Does her housework and takes care of husband. Denies chest pain, palpitations. Occasional shortness of breath when she overdoes it.  Scheduled for echo today.  Past Medical History:  Diagnosis Date  . Allergy    rhinitis  . Aortic stenosis    mild by echo 06/2017  . Arthritis   . Bradycardia    a. 10/2017 -> beta blocker cut back due to HR 39.  . Breast cancer ([place]) 01/06/2012  . Cancer [facility])    right colon and left breast  . Chronic diastolic CHF (congestive heart failure) ([place])   . Colon cancer ([place]) 01/06/2012  . Colovesical fistula    Dr. Marlou Starks and [name] planning surgery 08/2018- surgery revealed spontaneous closure  . COPD (chronic obstructive pulmonary disease) ([place])    pt. denies  . Coronary artery disease 2006   a.  NSTEMI in 2016, cath showed 15% prox-mid RCA, 20%  prox LAD, EF 25-35% by cath and 35-40% -> felt due to Takotsubo cardiomyopathy.  . Dilated aortic root ([place])    89mmHg by echo 06/2017  . Diverticulosis   . Dyspnea   . Edema extremities   . GERD (gastroesophageal reflux disease)   . Hernia   . Hiatal hernia    denies  . Hyperlipidemia   . Hypertension   . Mild aortic stenosis    echo 11/2015 but not noted on echo 06/2016  . Osteopenia   . Permanent atrial fibrillation ([facility])    chronic atrial fibrillation  . Pneumonia    hx child  . Pulmonary HTN ([place])    a. moderate to severe PASP 78mmHg echo 11/2015 - now 23mmHg by echo 06/2017. CTA chest in 11/16 with no PE. PFTs in 7/15 with mild obstructive lung disease. She had a negative sleep study in 2017. b. Felt due to left sided HF.  Marland Kitchen Stroke ([place])   . Takotsubo syndrome 07/29/2015   a. EF 35-40% by echo; akinesis of mid-apical anteroseptal and apical myocardium.  EF now normalized on echo 11/2015    Past Surgical History:  Procedure Laterality Date  . APPENDECTOMY    . BREAST SURGERY     lumpectomy left  . CARDIAC CATHETERIZATION    . CARDIAC CATHETERIZATION N/A 07/28/2015   Procedure: Left Heart Cath and Coronary Angiography;  Surgeon: Peter M Martinique, MD;  Location: [place] [facility];  Service: Cardiovascular;  Laterality: N/A;  . CHOLECYSTECTOMY    . COLECTOMY     right side  . CYSTOSCOPY WITH STENT PLACEMENT Left 09/04/2018   Procedure: CYSTOSCOPY WITH LEFT STENT PLACEMENT, BLADDER REPAIR, CYSTOSCOPY WITH LEFT STENT REMOVAL;  Surgeon: Raynelle Bring, MD;  Location: [facility];  Service: Urology;  Laterality: Left;  . EXCISION OF ACCESSORY NIPPLE Bilateral 05/30/2013   Procedure: BILATERAL NIPPLE BIOPSY;  Surgeon: Merrie Roof, MD;  Location: [place];  Service: General;  Laterality: Bilateral;  . EYE SURGERY Bilateral 12   cataracts  . IR RADIOLOGIST EVAL & MGMT  07/26/2017  . LAPAROSCOPIC RIGHT COLECTOMY N/A 09/04/2018   Procedure: LAPAROSCOPIC ASSISTED SIGMOID COLECTOMY WITH  REPAIR OF FISTULA TO BLADDER;  Surgeon: Autumn Messing III, MD;  Location: [facility];  Service: General;  Laterality: N/A;  . SPLIT NIGHT STUDY  02/02/2016        Current Medications: Current Meds  Medication Sig  . acetaminophen (TYLENOL) 500 MG tablet Take 500-1,000 mg by mouth every 6 (six) hours as needed for headache (pain).  Marland Kitchen albuterol (VENTOLIN HFA) 108 (90 Base) MCG/ACT inhaler Inhale 2 puffs into the lungs every 6 (six) hours as needed for wheezing or shortness of breath.  Marland Kitchen amLODipine (NORVASC) 5 MG tablet Take 1.5 tablets (7.5 mg total) by mouth daily.  Marland Kitchen augmented betamethasone dipropionate (DIPROLENE-AF) 0.05 % ointment Apply 1 application topically 2 (two) times daily as needed (for skin irritation/rashes.).   Marland Kitchen clonazePAM (KLONOPIN) 0.5 MG tablet TAKE 1 TABLET BY MOUTH TWICE DAILY AS NEEDED FOR ANXIETY OR  SLEEP  . cloNIDine (CATAPRES) 0.1 MG tablet Take 1 tablet by mouth twice daily  . diclofenac Sodium (VOLTAREN) 1 % GEL Apply 2 g topically 4 (four) times daily.  Marland Kitchen ELIQUIS 5 MG TABS tablet Take 1 tablet by mouth twice daily  . famotidine (PEPCID) 20 MG tablet One at bedtime  . Guaifenesin (MUCINEX MAXIMUM STRENGTH) 1200 MG TB12 Take 1,200 mg by mouth daily as needed (congestion).  Marland Kitchen losartan (COZAAR) 100 MG tablet Take 1 tablet (100 mg total) by mouth daily.  . LUBRICANT EYE DROPS 0.4-0.3 % SOLN Place 1 drop into both eyes 3 (three) times daily as needed (for dry/irritated eyes.).  Marland Kitchen nitroGLYCERIN (NITROSTAT) 0.4 MG SL tablet DISSOLVE ONE TABLET UNDER THE TONGUE EVERY 5 MINUTES AS NEEDED FOR CHEST PAIN.  DO NOT EXCEED A TOTAL OF 3 DOSES IN 15 MINUTES  . spironolactone (ALDACTONE) 25 MG tablet Take 1/2 (one-half) tablet by mouth once daily  . torsemide (DEMADEX) 20 MG tablet Take 3 tablets by mouth once daily     Allergies:   Contrast media [iodinated diagnostic agents], Clonidine derivatives, Statins, Sulfa antibiotics, Celebrex [celecoxib], Isosorbide nitrate, Other, and Tape    Social History   Socioeconomic History  . Marital status: Married    Spouse name: Not on file  . Number of children: Not on file  . Years of education: Not on file  . Highest education level: Not on file  Occupational History  . Not on file  Social Needs  . Financial resource strain: Not on file  . Food insecurity    Worry: Not on file    Inability: Not on file  . Transportation needs    Medical: Not on file    Non-medical: Not on file  Tobacco Use  . Smoking status: Never Smoker  . Smokeless tobacco: Never Used  Substance and Sexual Activity  . Alcohol use: No  . Drug use: No  .  Sexual activity: Not Currently  Lifestyle  . Physical activity    Days per week: Not on file    Minutes per session: Not on file  . Stress: Not on file  Relationships  . Social Herbalist on phone: Not on file    Gets together: Not on file    Attends religious service: Not on file    Active member of club or organization: Not on file    Attends meetings of clubs or organizations: Not on file    Relationship status: Not on file  Other Topics Concern  . Not on file  Social History Narrative  . Not on file     Family History:  The patient's   family history includes Cancer in her sister and sister; Heart attack in her mother; Heart disease in her mother; Heart disease (age of onset: 65) in her brother; Hypertension in her father.   ROS:   Please see the history of present illness.    ROS All other systems reviewed and are negative.   PHYSICAL EXAM:   VS:  BP (!) 122/50   Pulse (!) 58   Ht 5\' 7"  (1.702 m)   Wt 184 lb (83.5 kg)   SpO2 94%   BMI 28.82 kg/m   Physical Exam  GEN: Well nourished, well developed, in no acute distress  Neck: no JVD, carotid bruits, or masses Cardiac:RRR; no murmurs, rubs, or gallops  Respiratory:  clear to auscultation bilaterally, normal work of breathing GI: soft, nontender, nondistended, + BS Ext: Trace of edema bilaterally  Good distal  pulses bilaterally Neuro:  Alert and Oriented x 3 Psych: euthymic mood, full affect  Wt Readings from Last 3 Encounters:  08/14/19 184 lb (83.5 kg)  08/06/19 186 lb (84.4 kg)  07/24/19 180 lb 9.6 oz (81.9 kg)      Studies/Labs Reviewed:   EKG:  EKG is not ordered today.   Recent Labs: 07/23/2019: ALT 23; B Natriuretic Peptide 66.5 07/24/2019: BUN 28; Creatinine, Ser 0.93; Hemoglobin 10.7; Platelets 284; Potassium 4.6; Sodium 139   Lipid Panel    Component Value Date/Time   CHOL 145 02/14/2018 0927   TRIG 65 02/14/2018 0927   HDL 51 02/14/2018 0927   CHOLHDL 2.8 02/14/2018 0927   CHOLHDL 3.5 03/15/2016 1057   VLDL 23 03/15/2016 1057   LDLCALC 81 02/14/2018 0927    Additional studies/ records that were reviewed today include:  Nuclear stress test 07/24/19:  Defect 1: There is a medium defect of moderate severity.  This is a low risk study.  Nuclear stress EF: 70%.  No T wave inversion was noted during stress.  There was no ST segment deviation noted during stress.   Moderate size and intensity mostly fixed apical, apical septal, apical anterior and apical lateral perfusion defect, worse at rest than stress. No reversible ischemia. Suspect breast attenuation artifact. LVEF 70% with normal wall motion. This is a low risk study. _____________   2D echo 1/2020Study Conclusions   - Left ventricle: The cavity size was normal. Systolic function was   normal. The estimated ejection fraction was in the range of 60%   to 65%. Wall motion was normal; there were no regional wall   motion abnormalities. The study is not technically sufficient to   allow evaluation of LV diastolic function. - Aortic valve: Trileaflet; moderately thickened, moderately   calcified leaflets. - Mitral valve: Calcified annulus. There was mild regurgitation. - Left atrium: The atrium was  mildly dilated. - Right ventricle: The cavity size was mildly dilated. Wall   thickness was normal. - Right  atrium: The atrium was moderately dilated. - Tricuspid valve: There was mild regurgitation. - Pulmonary arteries: Systolic pressure was moderately increased.   PA peak pressure: 62 mm Hg (S).       ASSESSMENT:    1. Takotsubo syndrome   2. Pulmonary HTN ([place])   3. Essential hypertension   4. Coronary artery disease involving native coronary artery of native heart without angina pectoris   5. Permanent atrial fibrillation ([place])   6. Nonrheumatic aortic valve stenosis      PLAN:  In order of problems listed above:   Takotsubo cardiomyopathy with recovered ejection fraction now 60 to 65% on echo 08/2018.  For repeat echo today.  Follow-up with Dr. [name] in January as scheduled.  Pulmonary hypertension await echo results  essential hypertension blood pressure was up at PCPs but she had not taken her medicines yet.  Blood pressure is well controlled today.  No changes.  Nonobstructive CAD on cath in 2016-recent hospitalized for chest pain with negative troponins and EKG.  NST 07/2019 without ischemia no further chest pain.  Permanent atrial fibrillation on Eliquis no bleeding problems.  Hemoglobin stable at 10.7 in the hospital creatinine 0.93  History of bradycardia 02/2019 metoprolol discontinued  Chronic diastolic CHF compensated  Mild aortic stenosis on echo 08/2018.  Repeat echo today.   Medication Adjustments/Labs and Tests Ordered: Current medicines are reviewed at length with the patient today.  Concerns regarding medicines are outlined above.  Medication changes, Labs and Tests ordered today are listed in the Patient Instructions below. Patient Instructions  Medication Instructions:  No changes *If you need a refill on your cardiac medications before your next appointment, please call your pharmacy*  Lab Work: None ordered today If you have labs (blood work) drawn today and your tests are completely normal, you will receive your results only by: Marland Kitchen MyChart  Message (if you have MyChart) OR . A paper copy in the mail If you have any lab test that is abnormal or we need to change your treatment, we will call you to review the results.  Testing/Procedures: None ordered today  Follow-Up: At [facility], you and your health needs are our priority.  As part of our continuing mission to provide you with exceptional heart care, we have created designated Provider Care Teams.  These Care Teams include your primary Cardiologist (physician) and Advanced Practice Providers (APPs -  Physician Assistants and Nurse Practitioners) who all work together to provide you with the care you need, when you need it.  Your next appointment:   1 month(s)  The format for your next appointment:   Either In Person or Virtual  Provider:   You [place] see Fransico Him, MD or one of the following Advanced Practice Providers on your designated Care Team:    Melina Copa, PA-C  Ermalinda Barrios, PA-C   Other Instructions Keep apt with Dr. [name] in January for your follow up.      Sumner Boast, PA-C  08/14/2019 12:26 PM    [facility] [address], [place], [place]  [address] Phone: [phone]; Fax: [phone]

## 2019-08-14 ENCOUNTER — Encounter: Payer: Self-pay | Admitting: Physician Assistant

## 2019-08-14 ENCOUNTER — Ambulatory Visit: Payer: Medicare Other | Admitting: Physician Assistant

## 2019-08-14 ENCOUNTER — Other Ambulatory Visit (HOSPITAL_COMMUNITY): Payer: Medicare Other

## 2019-08-14 ENCOUNTER — Ambulatory Visit (HOSPITAL_COMMUNITY): Payer: Medicare Other | Attending: Cardiology

## 2019-08-14 ENCOUNTER — Other Ambulatory Visit: Payer: Self-pay

## 2019-08-14 VITALS — BP 122/50 | HR 58 | Ht 67.0 in | Wt 184.0 lb

## 2019-08-14 DIAGNOSIS — I4821 Permanent atrial fibrillation: Secondary | ICD-10-CM

## 2019-08-14 DIAGNOSIS — I272 Pulmonary hypertension, unspecified: Secondary | ICD-10-CM | POA: Diagnosis not present

## 2019-08-14 DIAGNOSIS — I350 Nonrheumatic aortic (valve) stenosis: Secondary | ICD-10-CM

## 2019-08-14 DIAGNOSIS — I10 Essential (primary) hypertension: Secondary | ICD-10-CM

## 2019-08-14 DIAGNOSIS — I2510 Atherosclerotic heart disease of native coronary artery without angina pectoris: Secondary | ICD-10-CM | POA: Diagnosis not present

## 2019-08-14 DIAGNOSIS — I5181 Takotsubo syndrome: Secondary | ICD-10-CM

## 2019-08-14 LAB — ECHOCARDIOGRAM COMPLETE
Height: 67 in
Weight: 2944 oz

## 2019-08-14 NOTE — Patient Instructions (Signed)
Medication Instructions:  No changes *If you need a refill on your cardiac medications before your next appointment, please call your pharmacy*  Lab Work: None ordered today If you have labs (blood work) drawn today and your tests are completely normal, you will receive your results only by: Marland Kitchen MyChart Message (if you have MyChart) OR . A paper copy in the mail If you have any lab test that is abnormal or we need to change your treatment, we will call you to review the results.  Testing/Procedures: None ordered today  Follow-Up: At [facility], you and your health needs are our priority.  As part of our continuing mission to provide you with exceptional heart care, we have created designated Provider Care Teams.  These Care Teams include your primary Cardiologist (physician) and Advanced Practice Providers (APPs -  Physician Assistants and Nurse Practitioners) who all work together to provide you with the care you need, when you need it.  Your next appointment:   1 month(s)  The format for your next appointment:   Either In Person or Virtual  Provider:   You [place] see Fransico Him, MD or one of the following Advanced Practice Providers on your designated Care Team:    Melina Copa, PA-C  Ermalinda Barrios, PA-C   Other Instructions Keep apt with Dr. [name] in January for your follow up.

## 2019-08-15 ENCOUNTER — Telehealth: Payer: Self-pay

## 2019-08-15 DIAGNOSIS — I272 Pulmonary hypertension, unspecified: Secondary | ICD-10-CM

## 2019-08-15 NOTE — Telephone Encounter (Signed)
-----   Message from [name], MD sent at 08/15/2019 11:41 AM EST ----- Please let patient know that echo showed normal LVF with mildly thickened heart muscle from HTN.  There moderate enlargement of the upper chambers of the heart, calcification of the MV due to aging, mildly leaky MV which is stable, mild stenosis of the AV, moderately increased BP in the arteries of the lungs.  Compared to prior study the BP in the arteries of the lungs has improved.  Repeat 2D echo in 1 year for pulmonary HTN

## 2019-08-15 NOTE — Telephone Encounter (Signed)
Left message for patient with echocardiogram results.

## 2019-08-29 ENCOUNTER — Other Ambulatory Visit: Payer: Self-pay | Admitting: Cardiology

## 2019-08-30 ENCOUNTER — Telehealth: Payer: Self-pay | Admitting: *Deleted

## 2019-08-30 NOTE — Telephone Encounter (Signed)
Spoke with patient and patient aware to call pharmacy to see if RX is ready for pick up.  Rx sent in to pharmacy this am at [facility]

## 2019-09-14 DIAGNOSIS — H524 Presbyopia: Secondary | ICD-10-CM | POA: Diagnosis not present

## 2019-09-14 DIAGNOSIS — H35373 Puckering of macula, bilateral: Secondary | ICD-10-CM | POA: Diagnosis not present

## 2019-09-19 ENCOUNTER — Other Ambulatory Visit: Payer: Self-pay | Admitting: Cardiology

## 2019-09-20 NOTE — Telephone Encounter (Signed)
Age 85, weight 83.5kg, SCr 0.93 on 07/24/19, afib indication, last OV Dec 2020

## 2019-09-25 ENCOUNTER — Other Ambulatory Visit: Payer: Self-pay | Admitting: Family Medicine

## 2019-09-25 DIAGNOSIS — F064 Anxiety disorder due to known physiological condition: Secondary | ICD-10-CM

## 2019-09-25 DIAGNOSIS — K5792 Diverticulitis of intestine, part unspecified, without perforation or abscess without bleeding: Secondary | ICD-10-CM

## 2019-09-25 NOTE — Telephone Encounter (Signed)
Ok to refill??  Last office visit/ refill 08/06/2019. 

## 2019-09-26 ENCOUNTER — Telehealth: Payer: Self-pay | Admitting: Cardiology

## 2019-09-26 NOTE — Telephone Encounter (Signed)
Patient's states that she needs a clearance letter stating that she is eligible to receive Covid-19 vaccination. Patient's vaccination appointment is scheduled for 10/01/19 at 5:20 AM. Please call.

## 2019-09-27 NOTE — Progress Notes (Signed)
Cardiology Office Note:    Date:  09/28/2019   ID:  [name], DOB [address] 14, 1936, MRN [id number]  PCP:  [name], MD  Cardiologist:  Fransico Him, MD    Referring MD: [name], MD   Chief Complaint  Patient presents with  . Cardiomyopathy  . Coronary Artery Disease  . Hypertension  . Hyperlipidemia  . Congestive Heart Failure    History of Present Illness:    [name] is a 85 y.o. female with a hx of Takotsubo cardiomyopathy EF 35 to 40% in 2016 with recovered EF, nonobstructive CAD on cath in 2016, hypertension, chronic diastolic CHF, pulmonary hypertension, mild aortic stenosis, permanent atrial fibrillation on Eliquis.  History of bradycardia 02/2019 at which time metoprolol was discontinued  Discharge from the hospital 07/24/2019 her observation for chest pain felt to be atypical.  Troponins negative, EKG unchanged.  Lexiscan Myoview 07/24/2019 no reversible ischemia likely breast attenuation artifact EF 70%.  She was seen by Estella Husk, PA in Dec 2020 due to elevated BP at PCP office. She had an echo that same day which showed normal LVF with mild LVH, moderate biatrial enlargement and mild MR, mild AS and pulmonary HTN which had improved from prior echo and BP was controlled at this OV.    She is here today for followup and is doing well.  She denies any chest pain or pressure,  PND, orthopnea,  dizziness, palpitations or syncope. She has chronic mild DOE which is stable.  She has occasional LE edema controlled on diuretics. She is compliant with her meds and is tolerating meds with no SE.     Past Medical History:  Diagnosis Date  . Allergy    rhinitis  . Aortic stenosis    mild by echo 06/2017  . Arthritis   . Bradycardia    a. 10/2017 -> beta blocker cut back due to HR 39.  . Breast cancer ([place]) 01/06/2012  . Cancer [facility])    right colon and left breast  . Chronic diastolic CHF (congestive heart failure) ([place])   . Colon cancer ([employer]) 01/06/2012  .  Colovesical fistula    Dr. Marlou Starks and [name] planning surgery 08/2018- surgery revealed spontaneous closure  . COPD (chronic obstructive pulmonary disease) ([place])    pt. denies  . Coronary artery disease 2006   a.  NSTEMI in 2016, cath showed 15% prox-mid RCA, 20% prox LAD, EF 25-35% by cath and 35-40% -> felt due to Takotsubo cardiomyopathy.  . Dilated aortic root ([place])    71mmHg by echo 06/2017  . Diverticulosis   . Dyspnea   . Edema extremities   . GERD (gastroesophageal reflux disease)   . Hernia   . Hiatal hernia    denies  . Hyperlipidemia   . Hypertension   . Mild aortic stenosis    echo 11/2015 but not noted on echo 06/2016  . Osteopenia   . Permanent atrial fibrillation (HCC)    chronic atrial fibrillation  . Pneumonia    hx child  . Pulmonary HTN ([place])    a. moderate to severe PASP 75mmHg echo 11/2015 - now 88mmHg by echo 06/2017. CTA chest in 11/16 with no PE. PFTs in 7/15 with mild obstructive lung disease. She had a negative sleep study in 2017. b. Felt due to left sided HF.  Marland Kitchen Stroke ([place])   . Takotsubo syndrome 07/29/2015   a. EF 35-40% by echo; akinesis of mid-apical anteroseptal and apical myocardium.  EF now normalized on echo 11/2015    Past Surgical History:  Procedure Laterality Date  . APPENDECTOMY    . BREAST SURGERY     lumpectomy left  . CARDIAC CATHETERIZATION    . CARDIAC CATHETERIZATION N/A 07/28/2015   Procedure: Left Heart Cath and Coronary Angiography;  Surgeon: Peter M Martinique, MD;  Location: [facility];  Service: Cardiovascular;  Laterality: N/A;  . CHOLECYSTECTOMY    . COLECTOMY     right side  . CYSTOSCOPY WITH STENT PLACEMENT Left 09/04/2018   Procedure: CYSTOSCOPY WITH LEFT STENT PLACEMENT, BLADDER REPAIR, CYSTOSCOPY WITH LEFT STENT REMOVAL;  Surgeon: Raynelle Bring, MD;  Location: [facility];  Service: Urology;  Laterality: Left;  . EXCISION OF ACCESSORY NIPPLE Bilateral 05/30/2013   Procedure: BILATERAL NIPPLE BIOPSY;  Surgeon: Merrie Roof, MD;  Location: [facility];  Service: General;  Laterality: Bilateral;  . EYE SURGERY Bilateral 12   cataracts  . IR RADIOLOGIST EVAL & MGMT  07/26/2017  . LAPAROSCOPIC RIGHT COLECTOMY N/A 09/04/2018   Procedure: LAPAROSCOPIC ASSISTED SIGMOID COLECTOMY WITH REPAIR OF FISTULA TO BLADDER;  Surgeon: Autumn Messing III, MD;  Location: [facility];  Service: General;  Laterality: N/A;  . SPLIT NIGHT STUDY  02/02/2016        Current Medications: Current Meds  Medication Sig  . acetaminophen (TYLENOL) 500 MG tablet Take 500-1,000 mg by mouth every 6 (six) hours as needed for headache (pain).  Marland Kitchen albuterol (VENTOLIN HFA) 108 (90 Base) MCG/ACT inhaler Inhale 2 puffs into the lungs every 6 (six) hours as needed for wheezing or shortness of breath.  Marland Kitchen amLODipine (NORVASC) 5 MG tablet Take 1.5 tablets (7.5 mg total) by mouth daily.  Marland Kitchen augmented betamethasone dipropionate (DIPROLENE-AF) 0.05 % ointment Apply 1 application topically 2 (two) times daily as needed (for skin irritation/rashes.).   Marland Kitchen clonazePAM (KLONOPIN) 0.5 MG tablet TAKE 1 TABLET BY MOUTH TWICE DAILY AS NEEDED FOR ANXIETY OR SLEEP  . cloNIDine (CATAPRES) 0.1 MG tablet Take 1 tablet by mouth twice daily  . ELIQUIS 5 MG TABS tablet Take 1 tablet by mouth twice daily  . famotidine (PEPCID) 20 MG tablet One at bedtime  . Guaifenesin (MUCINEX MAXIMUM STRENGTH) 1200 MG TB12 Take 1,200 mg by mouth daily as needed (congestion).  Marland Kitchen losartan (COZAAR) 100 MG tablet Take 1 tablet by mouth once daily  . LUBRICANT EYE DROPS 0.4-0.3 % SOLN Place 1 drop into both eyes 3 (three) times daily as needed (for dry/irritated eyes.).  Marland Kitchen nitroGLYCERIN (NITROSTAT) 0.4 MG SL tablet DISSOLVE ONE TABLET UNDER THE TONGUE EVERY 5 MINUTES AS NEEDED FOR CHEST PAIN.  DO NOT EXCEED A TOTAL OF 3 DOSES IN 15 MINUTES  . spironolactone (ALDACTONE) 25 MG tablet Take 1/2 (one-half) tablet by mouth once daily  . torsemide (DEMADEX) 20 MG tablet Take 3 tablets by mouth once daily      Allergies:   Contrast media [iodinated diagnostic agents], Clonidine derivatives, Statins, Sulfa antibiotics, Celebrex [celecoxib], Isosorbide nitrate, Other, and Tape   Social History   Socioeconomic History  . Marital status: Married    Spouse name: Not on file  . Number of children: Not on file  . Years of education: Not on file  . Highest education level: Not on file  Occupational History  . Not on file  Tobacco Use  . Smoking status: Never Smoker  . Smokeless tobacco: Never Used  Substance and Sexual Activity  . Alcohol use: No  . Drug use:  No  . Sexual activity: Not Currently  Other Topics Concern  . Not on file  Social History Narrative  . Not on file   Social Determinants of Health   Financial Resource Strain:   . Difficulty of Paying Living Expenses: Not on file  Food Insecurity:   . Worried About Charity fundraiser in the Last Year: Not on file  . Ran Out of Food in the Last Year: Not on file  Transportation Needs:   . Lack of Transportation (Medical): Not on file  . Lack of Transportation (Non-Medical): Not on file  Physical Activity:   . Days of Exercise per Week: Not on file  . Minutes of Exercise per Session: Not on file  Stress:   . Feeling of Stress : Not on file  Social Connections:   . Frequency of Communication with Friends and Family: Not on file  . Frequency of Social Gatherings with Friends and Family: Not on file  . Attends Religious Services: Not on file  . Active Member of Clubs or Organizations: Not on file  . Attends Archivist Meetings: Not on file  . Marital Status: Not on file     Family History: The patient's family history includes Cancer in her sister and sister; Heart attack in her mother; Heart disease in her mother; Heart disease (age of onset: 74) in her brother; Hypertension in her father. There is no history of Stroke.  ROS:   Please see the history of present illness.    ROS  All other systems reviewed and  negative.   EKGs/Labs/Other Studies Reviewed:    The following studies were reviewed today: Labs, 2D echo, office notes  EKG:  EKG is not ordered today.   Recent Labs: 07/23/2019: ALT 23; B Natriuretic Peptide 66.5 07/24/2019: BUN 28; Creatinine, Ser 0.93; Hemoglobin 10.7; Platelets 284; Potassium 4.6; Sodium 139   Recent Lipid Panel    Component Value Date/Time   CHOL 145 02/14/2018 0927   TRIG 65 02/14/2018 0927   HDL 51 02/14/2018 0927   CHOLHDL 2.8 02/14/2018 0927   CHOLHDL 3.5 03/15/2016 1057   VLDL 23 03/15/2016 1057   LDLCALC 81 02/14/2018 0927    Physical Exam:    VS:  BP (!) 144/46   Pulse (!) 50   Ht 5\' 5"  (1.651 m)   Wt 190 lb (86.2 kg)   SpO2 96%   BMI 31.62 kg/m     Wt Readings from Last 3 Encounters:  09/28/19 190 lb (86.2 kg)  08/14/19 184 lb (83.5 kg)  08/06/19 186 lb (84.4 kg)     GEN:  Well nourished, well developed in no acute distress HEENT: Normal NECK: No JVD; No carotid bruits LYMPHATICS: No lymphadenopathy CARDIAC: irregularly irregular  rubs, gallops.  2/6 SM at RUSB to LLSB RESPIRATORY:  Clear to auscultation without rales, wheezing or rhonchi  ABDOMEN: Soft, non-tender, non-distended MUSCULOSKELETAL:  No edema; No deformity  SKIN: Warm and dry NEUROLOGIC:  Alert and oriented x 3 PSYCHIATRIC:  Normal affect   ASSESSMENT:    1. Takotsubo syndrome   2. Pulmonary HTN ([place])   3. Essential hypertension   4. Coronary artery disease involving native coronary artery of native heart without angina pectoris   5. Bradycardia   6. Chronic diastolic CHF (congestive heart failure) ([place])   7. Nonrheumatic aortic valve stenosis   8. Permanent atrial fibrillation (HCC)    PLAN:    In order of problems listed above:  1.  Takotsubo DCM -resolved with EF 60-65% by echo 08/2018 and 08/2019  2.  Pulmonary HTN -PASP 62mmHg on echo 08/2019 with associated moderate to severe TR -felt secondary to [facility] group 2 from diastolic CHF -PASP has been  has high as the 60's in 2017 -continue diuretics -repeat 2D echo in 1 year to make sure this stays stable  3.  HTN -BP controlled -continue amlodipine 7.5mg  daily, Clonidine 0.1mg  BID, Losartan 100mg  daily and spiro 12.5mg  daily -creatinine 0.93 in Nov 2020  4.  ASCAD -non obstructive by cath 2016 -denies any anginal sx -no ASA due to Eliquis -no BB due to bradycardia -statin intolerant  5. Bradycardia -resolved off BB  6.  Chronic diastolic CHF -appears euvolemic on exam -denies any SOB or LE edema -continue Torsemide 20mg  daily  7.  Aortic stenosis -mild by echo 08/2019  8.  Permanent atrial fibrillation -denies any bleeding problems -continue Eliquis 5mg  BID -no BB due to bradycardia   Medication Adjustments/Labs and Tests Ordered: Current medicines are reviewed at length with the patient today.  Concerns regarding medicines are outlined above.  No orders of the defined types were placed in this encounter.  No orders of the defined types were placed in this encounter.   Signed, Fransico Him, MD  09/28/2019 1:01 PM    [place]

## 2019-09-28 ENCOUNTER — Ambulatory Visit: Payer: Medicare Other | Admitting: Cardiology

## 2019-09-28 ENCOUNTER — Encounter: Payer: Self-pay | Admitting: Cardiology

## 2019-09-28 ENCOUNTER — Other Ambulatory Visit: Payer: Self-pay

## 2019-09-28 VITALS — BP 144/46 | HR 50 | Ht 65.0 in | Wt 190.0 lb

## 2019-09-28 DIAGNOSIS — I10 Essential (primary) hypertension: Secondary | ICD-10-CM | POA: Diagnosis not present

## 2019-09-28 DIAGNOSIS — I272 Pulmonary hypertension, unspecified: Secondary | ICD-10-CM

## 2019-09-28 DIAGNOSIS — I5181 Takotsubo syndrome: Secondary | ICD-10-CM

## 2019-09-28 DIAGNOSIS — I2510 Atherosclerotic heart disease of native coronary artery without angina pectoris: Secondary | ICD-10-CM

## 2019-09-28 DIAGNOSIS — R001 Bradycardia, unspecified: Secondary | ICD-10-CM

## 2019-09-28 DIAGNOSIS — I4821 Permanent atrial fibrillation: Secondary | ICD-10-CM

## 2019-09-28 DIAGNOSIS — I350 Nonrheumatic aortic (valve) stenosis: Secondary | ICD-10-CM

## 2019-09-28 DIAGNOSIS — I5032 Chronic diastolic (congestive) heart failure: Secondary | ICD-10-CM

## 2019-09-28 NOTE — Patient Instructions (Addendum)
Medication Instructions:  Your physician recommends that you continue on your current medications as directed. Please refer to the Current Medication list given to you today.  *If you need a refill on your cardiac medications before your next appointment, please call your pharmacy*  Follow-Up: At [facility], you and your health needs are our priority.  As part of our continuing mission to provide you with exceptional heart care, we have created designated Provider Care Teams.  These Care Teams include your primary Cardiologist (physician) and Advanced Practice Providers (APPs -  Physician Assistants and Nurse Practitioners) who all work together to provide you with the care you need, when you need it.  Your next appointment:   6 month(s)  The format for your next appointment:   In Person  Provider:   You [place] see Traci Turner, MD or one of the following Advanced Practice Providers on your designated Care Team:    Dayna Dunn, PA-C  Michele Lenze, PA-C    

## 2019-10-12 ENCOUNTER — Ambulatory Visit (INDEPENDENT_AMBULATORY_CARE_PROVIDER_SITE_OTHER): Payer: Medicare Other | Admitting: Family Medicine

## 2019-10-12 ENCOUNTER — Encounter: Payer: Self-pay | Admitting: Family Medicine

## 2019-10-12 ENCOUNTER — Other Ambulatory Visit: Payer: Self-pay

## 2019-10-12 VITALS — BP 152/58 | HR 48 | Temp 97.8°F | Resp 16 | Ht 67.0 in | Wt 190.0 lb

## 2019-10-12 DIAGNOSIS — R21 Rash and other nonspecific skin eruption: Secondary | ICD-10-CM

## 2019-10-12 MED ORDER — CLOTRIMAZOLE-BETAMETHASONE 1-0.05 % EX CREA: 1.0000 "application " | TOPICAL_CREAM | Freq: Two times a day (BID) | CUTANEOUS | 0 refills | Status: DC

## 2019-10-12 NOTE — Progress Notes (Signed)
   Subjective:    Patient ID: [name], female    DOB: Apr 13, 1935, 85 y.o.   MRN: [id number]  Patient presents for Rash on abd  Here with rash across her lower abdomen.  States that she noted some itching yesterday.  She noted some red spots.  She is not sure how long they have been there.  No change in soap or detergent.  Her husband does not have any rash but states they sleep in separate rooms.  She is not had any fever cough congestion.  There is been no recent change in her medications.  She is some type of antifungal on it yesterday because it was itching and stinging it helped a little.  Is concerned it Minardi be shingles.   Review Of Systems:  GEN- denies fatigue, fever, weight loss,weakness, recent illness HEENT- denies eye drainage, change in vision, nasal discharge, CVS- denies chest pain, palpitations RESP- denies SOB, cough, wheeze ABD- denies N/V, change in stools, abd pain GU- denies dysuria, hematuria, dribbling, incontinence MSK- denies joint pain, muscle aches, injury Neuro- denies headache, dizziness, syncope, seizure activity       Objective:    BP (!) 152/58   Pulse (!) 48   Temp 97.8 F (36.6 C) (Other (Comment))   Resp 16   Ht 5\' 7"  (1.702 m)   Wt 190 lb (86.2 kg)   SpO2 96%   BMI 29.76 kg/m  GEN- NAD, alert and oriented x3  Neck- Supple, no LAD CVS- braydcardia , no murmur RESP-CTAB ABD-NABS,soft,NT,ND Can a few scattered erythematous scab-like lesions across her lower abdomen no lesions beneath the pannus the rash does cross the midline there is no vesicular lesions or blisters.  Nontender to palpation no drainage from the lesions- No rash on back, arms, palms, face , legs  EXT- No edema Pulses- Radial 2+        Assessment & Plan:      Problem List Items Addressed This Visit    None    Visit Diagnoses    Skin rash    -  Primary   Patient be some type of dermatitis unclear cause.  She does not have any other lesions on the skin consistent  with shingles.  Also considered scabies because of the distribution across the lower abdomen but no other lesions and the appearance is not classic of scabies.  We will treat her for unknown type of dermatitis.  Will treat with Lotrisone cream to his both antifungal and steroid.  If for some reason she does not improve next week I think we can add on permethrin especially if she has more of these lesions.      Note: This dictation was prepared with Dragon dictation along with smaller phrase technology. Any transcriptional errors that result from this process are unintentional.

## 2019-10-12 NOTE — Patient Instructions (Addendum)
Use cream twice a day  Call if not improved

## 2019-11-07 ENCOUNTER — Other Ambulatory Visit: Payer: Self-pay | Admitting: Cardiology

## 2019-11-28 ENCOUNTER — Other Ambulatory Visit: Payer: Self-pay | Admitting: Family Medicine

## 2019-11-28 DIAGNOSIS — K5792 Diverticulitis of intestine, part unspecified, without perforation or abscess without bleeding: Secondary | ICD-10-CM

## 2019-11-28 DIAGNOSIS — F064 Anxiety disorder due to known physiological condition: Secondary | ICD-10-CM

## 2019-11-28 NOTE — Telephone Encounter (Signed)
Ok to refill??  Last office visit 10/12/2019.  Last refill 09/25/2019.

## 2019-12-03 ENCOUNTER — Other Ambulatory Visit: Payer: Self-pay | Admitting: Family Medicine

## 2019-12-04 ENCOUNTER — Other Ambulatory Visit: Payer: Self-pay | Admitting: Family Medicine

## 2020-01-30 ENCOUNTER — Other Ambulatory Visit: Payer: Self-pay | Admitting: Family Medicine

## 2020-01-30 DIAGNOSIS — F064 Anxiety disorder due to known physiological condition: Secondary | ICD-10-CM

## 2020-01-30 DIAGNOSIS — K5792 Diverticulitis of intestine, part unspecified, without perforation or abscess without bleeding: Secondary | ICD-10-CM

## 2020-01-31 NOTE — Telephone Encounter (Signed)
Ok to refill??  Last office visit 08/06/2019.  Last refill 11/29/2019.

## 2020-02-07 DIAGNOSIS — N644 Mastodynia: Secondary | ICD-10-CM | POA: Diagnosis not present

## 2020-02-07 DIAGNOSIS — R928 Other abnormal and inconclusive findings on diagnostic imaging of breast: Secondary | ICD-10-CM | POA: Diagnosis not present

## 2020-02-27 ENCOUNTER — Encounter: Payer: Self-pay | Admitting: Family Medicine

## 2020-02-27 ENCOUNTER — Other Ambulatory Visit: Payer: Self-pay

## 2020-02-27 ENCOUNTER — Ambulatory Visit (INDEPENDENT_AMBULATORY_CARE_PROVIDER_SITE_OTHER): Payer: Medicare Other | Admitting: Family Medicine

## 2020-02-27 VITALS — BP 148/52 | HR 52 | Temp 98.0°F | Resp 14 | Ht 67.0 in | Wt 190.0 lb

## 2020-02-27 DIAGNOSIS — N950 Postmenopausal bleeding: Secondary | ICD-10-CM | POA: Diagnosis not present

## 2020-02-27 DIAGNOSIS — M542 Cervicalgia: Secondary | ICD-10-CM

## 2020-02-27 DIAGNOSIS — Z124 Encounter for screening for malignant neoplasm of cervix: Secondary | ICD-10-CM

## 2020-02-27 DIAGNOSIS — I10 Essential (primary) hypertension: Secondary | ICD-10-CM | POA: Diagnosis not present

## 2020-02-27 LAB — WET PREP FOR TRICH, YEAST, CLUE

## 2020-02-27 NOTE — Progress Notes (Signed)
Subjective:    Patient ID: [name], female    DOB: [birth year], 85 y.o.   MRN: [id number]  Patient presents for Sore Throat (x1 day- R side neck pain, reports tenderness with swallowing) and Spotting (x1 day- noted small amoutn of bright red blood on panty liner- also on blood thinner- no pain with urination, no vaginal itching) Patient here with vaginal bleeding that started yesterday.  She noted blood when she wiped but then she started dripping blood throughout the day.  She is now wearing a liner because she is still having some spotting.  She denies any abdominal pain no vaginal discharge.  She has not been sexually active in many years.  She does have her uterus intact.  She denies any urinary symptoms.  She also complains of neck pain on the right side.  She had a little tenderness with swallowing but mostly when she presses on that side she has discomfort.  She had similar symptoms back in November she was given muscle relaxant which she stated made her too sleepy but it eventually went away with use of topical creams.  States that she has arthritis everywhere.  She has had multiple surgeries    Review Of Systems:  GEN- denies fatigue, fever, weight loss,weakness, recent illness HEENT- denies eye drainage, change in vision, nasal discharge, CVS- denies chest pain, palpitations RESP- denies SOB, cough, wheeze ABD- denies N/V, change in stools, abd pain GU- denies dysuria, hematuria, dribbling, incontinence MSK- +joint pain, muscle aches, injury Neuro- denies headache, dizziness, syncope, seizure activity       Objective:    BP (!) 148/52   Pulse (!) 52   Temp 98 F (36.7 C) (Temporal)   Resp 14   Ht 5\' 7"  (1.702 m)   Wt 190 lb (86.2 kg)   SpO2 96%   BMI 29.76 kg/m  GEN- NAD, alert and oriented x3 HEENT- PERRL, EOMI, non injected sclera, pink conjunctiva, MMM, oropharynx clear Neck- Supple, no thyromegaly, fair ROM, mild TTP over right lateral neck, neg spurlings   CVS- irregular rhythem , systolic murmur  RESP-CTAB ABD-NABS,soft,NT,ND GU- atrophy external genitalia, vaginal atrophy blood in vault, no clots seen, cervix visualized no growth, no blood form os,no discharge, no CMT, ovarian not palapted , EXT- No edema Pulses- Radial, 2+   Wet prep neggative      Assessment & Plan:      Problem List Items Addressed This Visit      Unprioritized   Hypertension    Blood pressure appears to be at her baseline.  She has mildly elevated systolic.  No change in medication.  We will check her labs as she has had the postmenopausal bleeding or anemia or other metabolic disturbance.  Regarding neck pain she has had the same pain in the same spot before more musculoskeletal pain.  She can use topical anti-inflammatory rub or Tylenol for pain.  She will be referred to acute GYN for further evaluation of the postmenopausal bleeding.  Wet prep did not show any infection.  She does have gross blood in the vault.  I did Pap smear today.      Relevant Orders   CBC with Differential/Platelet   Comprehensive metabolic panel    Other Visit Diagnoses    Post-menopausal bleeding    -  Primary   Relevant Orders   PAP,TP IMGw/HPV RNA,rflx [id number],88/82   CBC with Differential/Platelet   Comprehensive metabolic panel   WET PREP FOR [place], YEAST, CLUE (Completed)  Ambulatory referral to Gynecology   Neck pain       Cervical cancer screening       Relevant Orders   PAP,TP IMGw/HPV RNA,rflx [id number],26/83      Note: This dictation was prepared with Dragon dictation along with smaller phrase technology. Any transcriptional errors that result from this process are unintentional.

## 2020-02-27 NOTE — Patient Instructions (Signed)
Use tylenol, heat to neck, aspercreme Referral to GYN for the bleeding We will call with results F/U pending results

## 2020-02-27 NOTE — Assessment & Plan Note (Signed)
Blood pressure appears to be at her baseline.  She has mildly elevated systolic.  No change in medication.  We will check her labs as she has had the postmenopausal bleeding or anemia or other metabolic disturbance.  Regarding neck pain she has had the same pain in the same spot before more musculoskeletal pain.  She can use topical anti-inflammatory rub or Tylenol for pain.  She will be referred to acute GYN for further evaluation of the postmenopausal bleeding.  Wet prep did not show any infection.  She does have gross blood in the vault.  I did Pap smear today.

## 2020-02-28 LAB — CBC WITH DIFFERENTIAL/PLATELET
Absolute Monocytes: 688 cells/uL (ref 200–950)
Basophils Absolute: 22 cells/uL (ref 0–200)
Basophils Relative: 0.4 %
Eosinophils Absolute: 88 cells/uL (ref 15–500)
Eosinophils Relative: 1.6 %
HCT: 32 % — ABNORMAL LOW (ref 35.0–45.0)
Hemoglobin: 10.3 g/dL — ABNORMAL LOW (ref 11.7–15.5)
Lymphs Abs: 2140 cells/uL (ref 850–3900)
MCH: 29.2 pg (ref 27.0–33.0)
MCHC: 32.2 g/dL (ref 32.0–36.0)
MCV: 90.7 fL (ref 80.0–100.0)
MPV: 9.5 fL (ref 7.5–12.5)
Monocytes Relative: 12.5 %
Neutro Abs: 2563 cells/uL (ref 1500–7800)
Neutrophils Relative %: 46.6 %
Platelets: 253 10*3/uL (ref 140–400)
RBC: 3.53 10*6/uL — ABNORMAL LOW (ref 3.80–5.10)
RDW: 13.1 % (ref 11.0–15.0)
Total Lymphocyte: 38.9 %
WBC: 5.5 10*3/uL (ref 3.8–10.8)

## 2020-02-28 LAB — COMPREHENSIVE METABOLIC PANEL
AG Ratio: 1.5 (calc) (ref 1.0–2.5)
ALT: 14 U/L (ref 6–29)
AST: 17 U/L (ref 10–35)
Albumin: 4.2 g/dL (ref 3.6–5.1)
Alkaline phosphatase (APISO): 89 U/L (ref 37–153)
BUN/Creatinine Ratio: 30 (calc) — ABNORMAL HIGH (ref 6–22)
BUN: 29 mg/dL — ABNORMAL HIGH (ref 7–25)
CO2: 24 mmol/L (ref 20–32)
Calcium: 9.1 mg/dL (ref 8.6–10.4)
Chloride: 99 mmol/L (ref 98–110)
Creat: 0.97 mg/dL — ABNORMAL HIGH (ref 0.60–0.88)
Globulin: 2.8 g/dL (calc) (ref 1.9–3.7)
Glucose, Bld: 107 mg/dL — ABNORMAL HIGH (ref 65–99)
Potassium: 5 mmol/L (ref 3.5–5.3)
Sodium: 130 mmol/L — ABNORMAL LOW (ref 135–146)
Total Bilirubin: 0.4 mg/dL (ref 0.2–1.2)
Total Protein: 7 g/dL (ref 6.1–8.1)

## 2020-02-28 LAB — PAP, TP IMAGING W/ HPV RNA, RFLX HPV TYPE 16,18/45: HPV DNA High Risk: NOT DETECTED

## 2020-02-29 ENCOUNTER — Other Ambulatory Visit: Payer: Self-pay | Admitting: *Deleted

## 2020-02-29 DIAGNOSIS — N950 Postmenopausal bleeding: Secondary | ICD-10-CM

## 2020-02-29 DIAGNOSIS — E871 Hypo-osmolality and hyponatremia: Secondary | ICD-10-CM

## 2020-03-05 ENCOUNTER — Encounter: Payer: Self-pay | Admitting: Obstetrics and Gynecology

## 2020-03-05 ENCOUNTER — Ambulatory Visit (INDEPENDENT_AMBULATORY_CARE_PROVIDER_SITE_OTHER): Payer: Medicare Other | Admitting: Obstetrics and Gynecology

## 2020-03-05 ENCOUNTER — Other Ambulatory Visit: Payer: Self-pay

## 2020-03-05 VITALS — BP 128/72 | HR 65 | Resp 18 | Ht 66.0 in | Wt 190.6 lb

## 2020-03-05 DIAGNOSIS — N950 Postmenopausal bleeding: Secondary | ICD-10-CM

## 2020-03-05 NOTE — Patient Instructions (Signed)
Postmenopausal Bleeding  Postmenopausal bleeding is any bleeding that a woman has after she has entered into menopause. Menopause is the end of a woman's fertile years. After menopause, a woman no longer ovulates and does not have menstrual periods. Postmenopausal bleeding [name] have various causes, including:  Menopausal hormone therapy (MHT).  Endometrial atrophy. After menopause, low estrogen hormone levels cause the membrane that lines the uterus (endometrium) to become thinner. You [name] have bleeding as the endometrium thins.  Endometrial hyperplasia. This condition is caused by excess estrogen hormones and low levels of progesterone hormones. The excess estrogen causes the endometrium to thicken, which can lead to bleeding. In some cases, this can lead to cancer of the uterus.  Endometrial cancer.  Non-cancerous growths (polyps) on the endometrium, the lining of the uterus, or the cervix.  Uterine fibroids. These are non-cancerous growths in or around the uterus muscle tissue that can cause heavy bleeding. Any type of postmenopausal bleeding, even if it appears to be a typical menstrual period, should be evaluated by your health care provider. Treatment will depend on the cause of the bleeding. Follow these instructions at home:  Pay attention to any changes in your symptoms.  Avoid using tampons and douches as told by your health care provider.  Change your pads regularly.  Get regular pelvic exams and Pap tests.  Take iron supplements as told by your health care provider.  Take over-the-counter and prescription medicines only as told by your health care provider.  Keep all follow-up visits as told by your health care provider. This is important. Contact a health care provider if:  Your bleeding lasts more than 1 week.  You have abdominal pain.  You have bleeding with or after sexual intercourse.  You have bleeding that happens more often than every 3 weeks. Get help  right away if:  You have a fever, chills, headache, dizziness, muscle aches, and bleeding.  You have severe pain with bleeding.  You are passing blood clots.  You have heavy bleeding, need more than 1 pad an hour, and have never experienced this before.  You feel faint. Summary  Postmenopausal bleeding is any bleeding that a woman has after she has entered into menopause.  Postmenopausal bleeding [name] have various causes. Treatment will depend on the cause of the bleeding.  Any type of postmenopausal bleeding, even if it appears to be a typical menstrual period, should be evaluated by your health care provider.  Be sure to pay attention to any changes in your symptoms and keep all follow-up visits as told by your health care provider. This information is not intended to replace advice given to you by your health care provider. Make sure you discuss any questions you have with your health care provider. Document Revised: 11/30/2017 Document Reviewed: 11/16/2016 [name] Patient Education  2020 [name]  

## 2020-03-05 NOTE — Progress Notes (Signed)
Patient ID: [name], female   DOB: [birth year], 85 y.o.   MRN: [id number]  Reason for Consult: Gynecologic Exam   Referred by [name], MD  Subjective:     HPI:  [name] is a 85 y.o. female.  She presents today with complaints of postmenopausal bleeding.  She reports that last week she noticed a small amount of bright red blood on her pad.  She reports that this happened 1 time.  She denies any other instances of postmenopausal bleeding.  She reports that the bleeding was small in amount.  She does have a history of colon cancer as well as hemorrhoids and sometimes has bright red blood with bowel movements.  She is uncertain if this could be related to urination or bowel movement.  She had a pelvic exam by her primary care physician which was very painful and she would like to avoid that today.  Did complete a Pap smear which was normal.  Gynecological History Menarche: [age] Menopause: 67 Describes periods as regular and monthly. No issues with pain or heavy bleeding.  Used OCP for 10-15 years Last pap smear: reports a history of normal pap smears. Last Mammogram: 2018- personal history of breast and colon cancer History of STDs: Denies Sexually Active: Not currently sexually active Denies hsitory of fibroids, polyps, or ovarian cysts  Obstetrical History [id number]  OB History  Gravida Para Term Preterm AB Living  2 1 1   1 1   SAB TAB Ectopic Multiple Live Births  1       1    # Outcome Date GA Lbr Len/2nd Weight Sex Delivery Anes PTL Lv  2 SAB 1962     SAB     1 Term 03/17/57    Jerilynn Mages Vag-Spont   LIV     Past Medical History:  Diagnosis Date  . Allergy    rhinitis  . Aortic stenosis    mild by echo 06/2017  . Arthritis   . Bradycardia    a. 10/2017 -> beta blocker cut back due to HR 39.  . Breast cancer ([place]) 01/06/2012  . Cancer [facility])    right colon and left breast  . Chronic diastolic CHF (congestive heart failure) ([place])   . Colon cancer ([place]) 01/06/2012  .  Colovesical fistula    Dr. Marlou Starks and [name] planning surgery 08/2018- surgery revealed spontaneous closure  . COPD (chronic obstructive pulmonary disease) ([place])    pt. denies  . Coronary artery disease 2006   a.  NSTEMI in 2016, cath showed 15% prox-mid RCA, 20% prox LAD, EF 25-35% by cath and 35-40% -> felt due to Takotsubo cardiomyopathy.  . Dilated aortic root ([place])    57mmHg by echo 06/2017  . Diverticulosis   . Dyspnea   . Edema extremities   . GERD (gastroesophageal reflux disease)   . Hernia   . Hiatal hernia    denies  . Hyperlipidemia   . Hypertension   . Mild aortic stenosis    echo 11/2015 but not noted on echo 06/2016  . Osteopenia   . Permanent atrial fibrillation (HCC)    chronic atrial fibrillation  . Pneumonia    hx child  . Pulmonary HTN ([place])    a. moderate to severe PASP 53mmHg echo 11/2015 - now 23mmHg by echo 06/2017. CTA chest in 11/16 with no PE. PFTs in 7/15 with mild obstructive lung disease. She had a negative sleep study in 2017. b. Felt due  to left sided HF.  Marland Kitchen Stroke ([place])   . Takotsubo syndrome 07/29/2015   a. EF 35-40% by echo; akinesis of mid-apical anteroseptal and apical myocardium.  EF now normalized on echo 11/2015   Family History  Problem Relation Age of Onset  . Heart disease Mother   . Heart attack Mother   . Cancer Sister        stomach and colon  . Heart disease Brother 58  . Hypertension Father   . Cancer Sister   . Stroke Neg Hx    Past Surgical History:  Procedure Laterality Date  . APPENDECTOMY    . BREAST SURGERY     lumpectomy left  . CARDIAC CATHETERIZATION    . CARDIAC CATHETERIZATION N/A 07/28/2015   Procedure: Left Heart Cath and Coronary Angiography;  Surgeon: Peter M Martinique, MD;  Location: [place];  Service: Cardiovascular;  Laterality: N/A;  . CHOLECYSTECTOMY    . COLECTOMY     right side  . CYSTOSCOPY WITH STENT PLACEMENT Left 09/04/2018   Procedure: CYSTOSCOPY WITH LEFT STENT PLACEMENT, BLADDER  REPAIR, CYSTOSCOPY WITH LEFT STENT REMOVAL;  Surgeon: Raynelle Bring, MD;  [facility];  Service: Urology;  Laterality: Left;  . EXCISION OF ACCESSORY NIPPLE Bilateral 05/30/2013   Procedure: BILATERAL NIPPLE BIOPSY;  Surgeon: Merrie Roof, MD;  Location: [place];  Service: General;  Laterality: Bilateral;  . EYE SURGERY Bilateral 12   cataracts  . IR RADIOLOGIST EVAL & MGMT  07/26/2017  . LAPAROSCOPIC RIGHT COLECTOMY N/A 09/04/2018   Procedure: LAPAROSCOPIC ASSISTED SIGMOID COLECTOMY WITH REPAIR OF FISTULA TO BLADDER;  Surgeon: Jovita Kussmaul, MD;  [facility];  Service: General;  Laterality: N/A;  . SPLIT NIGHT STUDY  02/02/2016        Short Social History:  Social History   Tobacco Use  . Smoking status: Never Smoker  . Smokeless tobacco: Never Used  Substance Use Topics  . Alcohol use: No    Allergies  Allergen Reactions  . Contrast Media [Iodinated Diagnostic Agents] Hives and Other (See Comments)    Per pt strong burning sensation starting in chest radiating outward   . Clonidine Derivatives Other (See Comments)    Throat dry  . Statins Other (See Comments)    "bones hurt"  . Sulfa Antibiotics Diarrhea    Tremors   . Celebrex [Celecoxib] Rash  . Isosorbide Nitrate Itching and Rash  . Other Itching and Rash    Plastic and paper tape and heart monitor pads  . Tape Itching and Rash    Red Where applied and will spread    Current Outpatient Medications  Medication Sig Dispense Refill  . acetaminophen (TYLENOL) 500 MG tablet Take 500-1,000 mg by mouth every 6 (six) hours as needed for headache (pain).    Marland Kitchen amLODipine (NORVASC) 5 MG tablet TAKE 1 & 1/2 (ONE & ONE-HALF) TABLETS BY MOUTH ONCE DAILY 135 tablet 2  . clonazePAM (KLONOPIN) 0.5 MG tablet TAKE 1 TABLET BY MOUTH TWICE DAILY AS NEEDED FOR ANXIETY OR  SLEEP 60 tablet 0  . cloNIDine (CATAPRES) 0.1 MG tablet Take 1 tablet by mouth twice daily 180 tablet 2  . clotrimazole-betamethasone (LOTRISONE) cream  Apply 1 application topically 2 (two) times daily. 30 g 0  . ELIQUIS 5 MG TABS tablet Take 1 tablet by mouth twice daily 180 tablet 1  . famotidine (PEPCID) 20 MG tablet One at bedtime 30 tablet 11  . losartan (COZAAR) 100 MG tablet  Take 1 tablet by mouth once daily 90 tablet 3  . nitroGLYCERIN (NITROSTAT) 0.4 MG SL tablet DISSOLVE ONE TABLET UNDER THE TONGUE EVERY 5 MINUTES AS NEEDED FOR CHEST PAIN.  DO NOT EXCEED A TOTAL OF 3 DOSES IN 15 MINUTES 25 tablet 0  . spironolactone (ALDACTONE) 25 MG tablet Take 1/2 (one-half) tablet by mouth once daily 45 tablet 3  . torsemide (DEMADEX) 20 MG tablet Take 3 tablets by mouth once daily 90 tablet 10   No current facility-administered medications for this visit.    Review of Systems  Constitutional: Negative for chills, fatigue, fever and unexpected weight change.  HENT: Negative for trouble swallowing.  Eyes: Negative for loss of vision.  Respiratory: Negative for cough, shortness of breath and wheezing.  Cardiovascular: Negative for chest pain, leg swelling, palpitations and syncope.  GI: Negative for abdominal pain, blood in stool, diarrhea, nausea and vomiting.  GU: Negative for difficulty urinating, dysuria, frequency and hematuria.  Musculoskeletal: Negative for back pain, leg pain and joint pain.  Skin: Negative for rash.  Neurological: Negative for dizziness, headaches, light-headedness, numbness and seizures.  Psychiatric: Negative for behavioral problem, confusion, depressed mood and sleep disturbance.        Objective:  Objective   Vitals:   03/05/20 1428  BP: 128/72  Pulse: 65  Resp: 18  SpO2: 98%  Weight: 190 lb 9.6 oz (86.5 kg)  Height: 5\' 6"  (1.676 m)   Body mass index is 30.76 kg/m.  Physical Exam Vitals and nursing note reviewed.  Constitutional:      Appearance: She is well-developed.  HENT:     Head: Normocephalic and atraumatic.  Eyes:     Pupils: Pupils are equal, round, and reactive to light.    Cardiovascular:     Rate and Rhythm: Normal rate and regular rhythm.  Pulmonary:     Effort: Pulmonary effort is normal. No respiratory distress.  Genitourinary:    Comments: External examination reveals grossly normal vulva.  Multiple small petechiae consistent with atrophy versus lichen sclerosis present bilaterally.  Patient does report vulvar pain and itching with questioning.  Rectum normal.  Small external hemorrhoid present.  Speculum and bimanual exam deferred today because secondary to patient's pain after recent pelvic exam. Skin:    General: Skin is warm and dry.  Neurological:     Mental Status: She is alert and oriented to person, place, and time.  Psychiatric:        Behavior: Behavior normal.        Thought Content: Thought content normal.        Judgment: Judgment normal.        Assessment/Plan:     85 year old with one-time episode of postmenopausal bleeding We will have patient return for pelvic ultrasound.  If patient is not able to tolerate a vaginal transducer will opt for abdominal transducer. We will consider sampling of endometrium based off of pelvic ultrasound results. Discussed postmenopausal bleeding and provide the patient with a handout regarding postmenopausal bleeding.  Patient was encouraged to report any future episodes of postmenopausal bleeding as well.  More than 30 minutes were spent face to face with the patient in the room, reviewing the medical record, labs and images, and coordinating care for the patient. The plan of management was discussed in detail and counseling was provided.   Will follow up with the patient in 2 weeks for pelvic ultrasound.   Adrian Prows MD [facility], [facility] 03/05/2020 2:52 PM

## 2020-03-06 ENCOUNTER — Other Ambulatory Visit: Payer: Medicare Other

## 2020-03-06 DIAGNOSIS — E871 Hypo-osmolality and hyponatremia: Secondary | ICD-10-CM

## 2020-03-06 LAB — BASIC METABOLIC PANEL
BUN/Creatinine Ratio: 26 (calc) — ABNORMAL HIGH (ref 6–22)
BUN: 34 mg/dL — ABNORMAL HIGH (ref 7–25)
CO2: 25 mmol/L (ref 20–32)
Calcium: 9 mg/dL (ref 8.6–10.4)
Chloride: 94 mmol/L — ABNORMAL LOW (ref 98–110)
Creat: 1.29 mg/dL — ABNORMAL HIGH (ref 0.60–0.88)
Glucose, Bld: 99 mg/dL (ref 65–99)
Potassium: 5.5 mmol/L — ABNORMAL HIGH (ref 3.5–5.3)
Sodium: 128 mmol/L — ABNORMAL LOW (ref 135–146)

## 2020-03-07 ENCOUNTER — Other Ambulatory Visit: Payer: Self-pay

## 2020-03-14 ENCOUNTER — Ambulatory Visit (INDEPENDENT_AMBULATORY_CARE_PROVIDER_SITE_OTHER): Payer: Medicare Other | Admitting: Family Medicine

## 2020-03-14 ENCOUNTER — Other Ambulatory Visit: Payer: Self-pay

## 2020-03-14 ENCOUNTER — Other Ambulatory Visit: Payer: Self-pay | Admitting: Family Medicine

## 2020-03-14 VITALS — BP 140/38 | HR 60 | Temp 97.5°F | Ht 66.0 in | Wt 179.0 lb

## 2020-03-14 DIAGNOSIS — I10 Essential (primary) hypertension: Secondary | ICD-10-CM

## 2020-03-14 DIAGNOSIS — E875 Hyperkalemia: Secondary | ICD-10-CM | POA: Diagnosis not present

## 2020-03-14 DIAGNOSIS — I5181 Takotsubo syndrome: Secondary | ICD-10-CM

## 2020-03-14 DIAGNOSIS — E871 Hypo-osmolality and hyponatremia: Secondary | ICD-10-CM | POA: Diagnosis not present

## 2020-03-14 DIAGNOSIS — I4821 Permanent atrial fibrillation: Secondary | ICD-10-CM

## 2020-03-14 LAB — BASIC METABOLIC PANEL WITH GFR
BUN/Creatinine Ratio: 34 (calc) — ABNORMAL HIGH (ref 6–22)
BUN: 42 mg/dL — ABNORMAL HIGH (ref 7–25)
CO2: 26 mmol/L (ref 20–32)
Calcium: 10 mg/dL (ref 8.6–10.4)
Chloride: 94 mmol/L — ABNORMAL LOW (ref 98–110)
Creat: 1.22 mg/dL — ABNORMAL HIGH (ref 0.60–0.88)
GFR, Est African American: 47 mL/min/{1.73_m2} — ABNORMAL LOW (ref >=60)
GFR, Est Non African American: 41 mL/min/{1.73_m2} — ABNORMAL LOW (ref >=60)
Glucose, Bld: 108 mg/dL — ABNORMAL HIGH (ref 65–99)
Potassium: 4.7 mmol/L (ref 3.5–5.3)
Sodium: 130 mmol/L — ABNORMAL LOW (ref 135–146)

## 2020-03-14 NOTE — Progress Notes (Signed)
Subjective:    Patient ID: [name], female    DOB: 1934/12/21, 85 y.o.   MRN: [id number]  In 2016, the patient was diagnosed with Takotsubo cardiomyopathy with an ejection fraction of 35 to 40%.  For that reason she is on spironolactone coupled with losartan as well as torsemide.  Previously she was taking 60 mg of torsemide daily.  She has since reduced to 40 mg a day.  She saw my partner recently and was diagnosed with hyponatremia and hyperkalemia.  Sodium levels were around 130 and potassium was 5.5.  She was given Kayexalate x1 and told to follow-up with me today.  She denies any chest pain shortness of breath dyspnea on exertion. Past Medical History:  Diagnosis Date  . Allergy    rhinitis  . Aortic stenosis    mild by echo 06/2017  . Arthritis   . Bradycardia    a. 10/2017 -> beta blocker cut back due to HR 39.  . Breast cancer ([place]) 01/06/2012  . Cancer [facility])    right colon and left breast  . Chronic diastolic CHF (congestive heart failure) ([place])   . Colon cancer ([place]) 01/06/2012  . Colovesical fistula    Dr. Marlou Starks and [name] planning surgery 08/2018- surgery revealed spontaneous closure  . COPD (chronic obstructive pulmonary disease) ([place])    pt. denies  . Coronary artery disease 2006   a.  NSTEMI in 2016, cath showed 15% prox-mid RCA, 20% prox LAD, EF 25-35% by cath and 35-40% -> felt due to Takotsubo cardiomyopathy.  . Dilated aortic root ([place])    18mmHg by echo 06/2017  . Diverticulosis   . Dyspnea   . Edema extremities   . GERD (gastroesophageal reflux disease)   . Hernia   . Hiatal hernia    denies  . Hyperlipidemia   . Hypertension   . Mild aortic stenosis    echo 11/2015 but not noted on echo 06/2016  . Osteopenia   . Permanent atrial fibrillation (HCC)    chronic atrial fibrillation  . Pneumonia    hx child  . Pulmonary HTN ([place])    a. moderate to severe PASP 68mmHg echo 11/2015 - now 23mmHg by echo 06/2017. CTA chest in 11/16 with no PE. PFTs in 7/15 with  mild obstructive lung disease. She had a negative sleep study in 2017. b. Felt due to left sided HF.  Marland Kitchen Stroke ([place])   . Takotsubo syndrome 07/29/2015   a. EF 35-40% by echo; akinesis of mid-apical anteroseptal and apical myocardium.  EF now normalized on echo 11/2015   Past Surgical History:  Procedure Laterality Date  . APPENDECTOMY    . BREAST SURGERY     lumpectomy left  . CARDIAC CATHETERIZATION    . CARDIAC CATHETERIZATION N/A 07/28/2015   Procedure: Left Heart Cath and Coronary Angiography;  Surgeon: Peter M Martinique, MD;  Location: [place] [facility];  Service: Cardiovascular;  Laterality: N/A;  . CHOLECYSTECTOMY    . COLECTOMY     right side  . CYSTOSCOPY WITH STENT PLACEMENT Left 09/04/2018   Procedure: CYSTOSCOPY WITH LEFT STENT PLACEMENT, BLADDER REPAIR, CYSTOSCOPY WITH LEFT STENT REMOVAL;  Surgeon: Raynelle Bring, MD;  [facility];  Service: Urology;  Laterality: Left;  . EXCISION OF ACCESSORY NIPPLE Bilateral 05/30/2013   Procedure: BILATERAL NIPPLE BIOPSY;  Surgeon: Merrie Roof, MD;  Location: [facility];  Service: General;  Laterality: Bilateral;  . EYE SURGERY Bilateral 12   cataracts  .  IR RADIOLOGIST EVAL & MGMT  07/26/2017  . LAPAROSCOPIC RIGHT COLECTOMY N/A 09/04/2018   Procedure: LAPAROSCOPIC ASSISTED SIGMOID COLECTOMY WITH REPAIR OF FISTULA TO BLADDER;  Surgeon: Jovita Kussmaul, MD;  [facility];  Service: General;  Laterality: N/A;  . SPLIT NIGHT STUDY  02/02/2016       Current Outpatient Medications on File Prior to Visit  Medication Sig Dispense Refill  . acetaminophen (TYLENOL) 500 MG tablet Take 500-1,000 mg by mouth every 6 (six) hours as needed for headache (pain).    Marland Kitchen amLODipine (NORVASC) 5 MG tablet TAKE 1 & 1/2 (ONE & ONE-HALF) TABLETS BY MOUTH ONCE DAILY 135 tablet 2  . clonazePAM (KLONOPIN) 0.5 MG tablet TAKE 1 TABLET BY MOUTH TWICE DAILY AS NEEDED FOR ANXIETY OR  SLEEP 60 tablet 0  . cloNIDine (CATAPRES) 0.1 MG tablet Take 1 tablet by mouth  twice daily 180 tablet 2  . clotrimazole-betamethasone (LOTRISONE) cream Apply 1 application topically 2 (two) times daily. 30 g 0  . ELIQUIS 5 MG TABS tablet Take 1 tablet by mouth twice daily 180 tablet 1  . famotidine (PEPCID) 20 MG tablet One at bedtime 30 tablet 11  . losartan (COZAAR) 100 MG tablet Take 1 tablet by mouth once daily 90 tablet 3  . nitroGLYCERIN (NITROSTAT) 0.4 MG SL tablet DISSOLVE ONE TABLET UNDER THE TONGUE EVERY 5 MINUTES AS NEEDED FOR CHEST PAIN.  DO NOT EXCEED A TOTAL OF 3 DOSES IN 15 MINUTES 25 tablet 0  . spironolactone (ALDACTONE) 25 MG tablet Take 1/2 (one-half) tablet by mouth once daily 45 tablet 3  . torsemide (DEMADEX) 20 MG tablet Take 3 tablets by mouth once daily (Patient taking differently: 40 mg daily. ) 90 tablet 10   No current facility-administered medications on file prior to visit.   Allergies  Allergen Reactions  . Contrast Media [Iodinated Diagnostic Agents] Hives and Other (See Comments)    Per pt strong burning sensation starting in chest radiating outward   . Clonidine Derivatives Other (See Comments)    Throat dry  . Statins Other (See Comments)    "bones hurt"  . Sulfa Antibiotics Diarrhea    Tremors   . Celebrex [Celecoxib] Rash  . Isosorbide Nitrate Itching and Rash  . Other Itching and Rash    Plastic and paper tape and heart monitor pads  . Tape Itching and Rash    Red Where applied and will spread   Social History   Socioeconomic History  . Marital status: Married    Spouse name: Not on file  . Number of children: Not on file  . Years of education: Not on file  . Highest education level: Not on file  Occupational History  . Not on file  Tobacco Use  . Smoking status: Never Smoker  . Smokeless tobacco: Never Used  Vaping Use  . Vaping Use: Never used  Substance and Sexual Activity  . Alcohol use: No  . Drug use: No  . Sexual activity: Not Currently  Other Topics Concern  . Not on file  Social History Narrative    . Not on file   Social Determinants of Health   Financial Resource Strain:   . Difficulty of Paying Living Expenses:   Food Insecurity:   . Worried About Charity fundraiser in the Last Year:   . Arboriculturist in the Last Year:   Transportation Needs:   . Film/video editor (Medical):   Marland Kitchen Lack of Transportation (  Non-Medical):   Physical Activity:   . Days of Exercise per Week:   . Minutes of Exercise per Session:   Stress:   . Feeling of Stress :   Social Connections:   . Frequency of Communication with Friends and Family:   . Frequency of Social Gatherings with Friends and Family:   . Attends Religious Services:   . Active Member of Clubs or Organizations:   . Attends Archivist Meetings:   Marland Kitchen Marital Status:   Intimate Partner Violence:   . Fear of Current or Ex-Partner:   . Emotionally Abused:   Marland Kitchen Physically Abused:   . Sexually Abused:       Review of Systems  Musculoskeletal: Positive for back pain.  All other systems reviewed and are negative.      Objective:   Physical Exam Vitals reviewed.  Constitutional:      General: She is not in acute distress.    Appearance: Normal appearance. She is well-developed. She is not diaphoretic.  HENT:     Right Ear: External ear normal.     Left Ear: External ear normal.     Nose: Rhinorrhea present. No mucosal edema.     Mouth/Throat:     Pharynx: No oropharyngeal exudate.  Eyes:     Conjunctiva/sclera: Conjunctivae normal.  Neck:     Thyroid: No thyroid mass.     Vascular: Normal carotid pulses. No carotid bruit or JVD.     Trachea: Trachea and phonation normal.  Cardiovascular:     Rate and Rhythm: Bradycardia present. Rhythm irregularly irregular.     Heart sounds: Normal heart sounds.  Pulmonary:     Effort: Pulmonary effort is normal. No respiratory distress.     Breath sounds: Normal breath sounds. No wheezing or rales.  Chest:     Chest wall: No tenderness.  Abdominal:     General:  Bowel sounds are normal. There is no distension.     Palpations: Abdomen is not rigid.     Tenderness: There is no abdominal tenderness. There is no guarding or rebound.  Musculoskeletal:     Cervical back: Normal range of motion and neck supple. No edema or erythema.  Lymphadenopathy:     Cervical: No cervical adenopathy.  Skin:    General: Skin is warm.     Findings: No erythema.           Assessment & Plan:  Hyperkalemia - Plan: BASIC METABOLIC PANEL WITH GFR  Benign essential HTN  Hyponatremia  Takotsubo syndrome  Permanent atrial fibrillation (HCC)  I believe her hyponatremia and hyperkalemia is due to aldosterone suppression due to the combination of losartan and spironolactone.  Fortunately this is improved her ejection fraction from 35% to 65% on an echocardiogram in December of last year.  However if her potassium remains elevated, I would recommend discontinuation of spironolactone and we Porte even need to reduce the dose of losartan.  I will await the results of the BMP before making any further decisions.  Patient is completely asymptomatic.

## 2020-03-18 ENCOUNTER — Other Ambulatory Visit: Payer: Self-pay

## 2020-03-24 ENCOUNTER — Other Ambulatory Visit: Payer: Self-pay | Admitting: Obstetrics and Gynecology

## 2020-03-24 ENCOUNTER — Ambulatory Visit (INDEPENDENT_AMBULATORY_CARE_PROVIDER_SITE_OTHER): Payer: Medicare Other | Admitting: Obstetrics and Gynecology

## 2020-03-24 ENCOUNTER — Encounter: Payer: Self-pay | Admitting: Obstetrics and Gynecology

## 2020-03-24 ENCOUNTER — Other Ambulatory Visit: Payer: Self-pay

## 2020-03-24 ENCOUNTER — Ambulatory Visit (INDEPENDENT_AMBULATORY_CARE_PROVIDER_SITE_OTHER): Payer: Medicare Other

## 2020-03-24 VITALS — BP 132/70 | HR 66 | Resp 18 | Ht 66.0 in | Wt 189.6 lb

## 2020-03-24 DIAGNOSIS — N950 Postmenopausal bleeding: Secondary | ICD-10-CM

## 2020-03-24 NOTE — Patient Instructions (Signed)
Hysteroscopy °Hysteroscopy is a procedure that is used to examine the inside of a woman's womb (uterus). This Kat[place]ama be done for various reasons, including: °· To look for lumps (tumors) and other growths in the uterus. °· To evaluate abnormal bleeding, fibroid tumors, polyps, scar tissue (adhesions), or cancer of the uterus. °· To determine the cause of an inability to get pregnant (infertility) or repeated losses of pregnancies (miscarriages). °· To find a lost IUD (intrauterine device). °· To perform a procedure that permanently prevents pregnancy (sterilization). °During this procedure, a thin, flexible tube with a small light and camera (hysteroscope) is used to examine the uterus. The camera sends images to a monitor in the room so that your health care provider can view the inside of your uterus. A hysteroscopy should be done right after a menstrual period to make sure that you are not pregnant. °Tell a health care provider about: °· Any allergies you have. °· All medicines you are taking, including vitamins, herbs, eye drops, creams, and over-the-counter medicines. °· Any problems you or family members have had with the use of anesthetic medicines. °· Any blood disorders you have. °· Any surgeries you have had. °· Any medical conditions you have. °· Whether you are pregnant or Antos be pregnant. °What are the risks? °Generally, this is a safe procedure. However, problems Soth occur, including: °· Excessive bleeding. °· Infection. °· Damage to the uterus or other structures or organs. °· Allergic reaction to medicines or fluids that are used in the procedure. °What happens before the procedure? °Staying hydrated °Follow instructions from your health care provider about hydration, which Starn include: °· Up to 2 hours before the procedure - you Lagerquist continue to drink clear liquids, such as water, clear fruit juice, black coffee, and plain tea. °Eating and drinking restrictions °Follow instructions from your health care  provider about eating and drinking, which Venson include: °· 8 hours before the procedure - stop eating solid foods and drink clear liquids only °· 2 hours before the procedure - stop drinking clear liquids. °General instructions °· Ask your health care provider about: °? Changing or stopping your normal medicines. This is important if you take diabetes medicines or blood thinners. °? Taking medicines such as aspirin and ibuprofen. These medicines can thin your blood and cause bleeding. Do not take these medicines for 1 week before your procedure, or as told by your health care provider. °· Do not use any products that contain nicotine or tobacco for 2 weeks before the procedure. This includes cigarettes and e-cigarettes. If you need help quitting, ask your health care provider. °· Medicine Brokaw be placed in your cervix the day before the procedure. This medicine causes the cervix to have a larger opening (dilate). The larger opening makes it easier for the hysteroscope to be inserted into the uterus during the procedure. °· Plan to have someone with you for the first 24-48 hours after the procedure, especially if you are given a medicine to make you fall asleep (general anesthetic). °· Plan to have someone take you home from the hospital or clinic. °What happens during the procedure? °· To lower your risk of infection: °? Your health care team will wash or sanitize their hands. °? Your skin will be washed with soap. °? Hair Hibner be removed from the surgical area. °· An IV tube will be inserted into one of your veins. °· You Strite be given one or more of the following: °? A medicine to help   you relax (sedative). °? A medicine that numbs the area around the cervix (local anesthetic). °? A medicine to make you fall asleep (general anesthetic). °· A hysteroscope will be inserted through your vagina and into your uterus. °· Air or fluid will be used to enlarge your uterus, enabling your health care provider to see your uterus  better. The amount of fluid used will be carefully checked throughout the procedure. °· In some cases, tissue Fabel be gently scraped from inside the uterus and sent to a lab for testing (biopsy). °The procedure Mathurin vary among health care providers and hospitals. °What happens after the procedure? °· Your blood pressure, heart rate, breathing rate, and blood oxygen level will be monitored until the medicines you were given have worn off. °· You Fiser have some cramping. You Marchesi be given medicines for this. °· You Trotter have bleeding, which varies from light spotting to menstrual-like bleeding. This is normal. °· If you had a biopsy done, it is your responsibility to get the results of your procedure. Ask your health care provider, or the department performing the procedure, when your results will be ready. °Summary °· Hysteroscopy is a procedure that is used to examine the inside of a woman's womb (uterus). °· After the procedure, you Bulthuis have bleeding, which varies from light spotting to menstrual-like bleeding. This is normal. You Kargbo also have cramping. °· Plan to have someone take you home from the hospital or clinic. °This information is not intended to replace advice given to you by your health care provider. Make sure you discuss any questions you have with your health care provider. °Document Revised: 08/05/2017 Document Reviewed: 09/21/2016 °Elsevier Patient Education © 2020 [employer] ° °

## 2020-03-24 NOTE — Progress Notes (Signed)
Patient ID: [name], female   DOB: 06-14-1935, 85 y.o.   MRN: [id number]  Reason for Consult: Gynecologic Exam   Referred by [name], MD  Subjective:     HPI:  [name] is a 85 y.o. female she is following up today for a pelvic ultrasound.  She reports that she has not had any further bleeding.   Past Medical History:  Diagnosis Date  . Allergy    rhinitis  . Aortic stenosis    mild by echo 06/2017  . Arthritis   . Bradycardia    a. 10/2017 -> beta blocker cut back due to HR 39.  . Breast cancer ([place]) 01/06/2012  . Cancer [facility])    right colon and left breast  . Chronic diastolic CHF (congestive heart failure) ([place])   . Colon cancer ([place]) 01/06/2012  . Colovesical fistula    Dr. Marlou Starks and [name] planning surgery 08/2018- surgery revealed spontaneous closure  . COPD (chronic obstructive pulmonary disease) ([place])    pt. denies  . Coronary artery disease 2006   a.  NSTEMI in 2016, cath showed 15% prox-mid RCA, 20% prox LAD, EF 25-35% by cath and 35-40% -> felt due to Takotsubo cardiomyopathy.  . Dilated aortic root ([place])    47mmHg by echo 06/2017  . Diverticulosis   . Dyspnea   . Edema extremities   . GERD (gastroesophageal reflux disease)   . Hernia   . Hiatal hernia    denies  . Hyperlipidemia   . Hypertension   . Mild aortic stenosis    echo 11/2015 but not noted on echo 06/2016  . Osteopenia   . Permanent atrial fibrillation (HCC)    chronic atrial fibrillation  . Pneumonia    hx child  . Pulmonary HTN ([place])    a. moderate to severe PASP 79mmHg echo 11/2015 - now 32mmHg by echo 06/2017. CTA chest in 11/16 with no PE. PFTs in 7/15 with mild obstructive lung disease. She had a negative sleep study in 2017. b. Felt due to left sided HF.  Marland Kitchen Stroke ([place])   . Takotsubo syndrome 07/29/2015   a. EF 35-40% by echo; akinesis of mid-apical anteroseptal and apical myocardium.  EF now normalized on echo 11/2015   Family History  Problem Relation Age of Onset  .  Heart disease Mother   . Heart attack Mother   . Cancer Sister        stomach and colon  . Heart disease Brother 22  . Hypertension Father   . Cancer Sister   . Stroke Neg Hx    Past Surgical History:  Procedure Laterality Date  . APPENDECTOMY    . BREAST SURGERY     lumpectomy left  . CARDIAC CATHETERIZATION    . CARDIAC CATHETERIZATION N/A 07/28/2015   Procedure: Left Heart Cath and Coronary Angiography;  Surgeon: Peter M Martinique, MD;  Location: [place] [facility];  Service: Cardiovascular;  Laterality: N/A;  . CHOLECYSTECTOMY    . COLECTOMY     right side  . CYSTOSCOPY WITH STENT PLACEMENT Left 09/04/2018   Procedure: CYSTOSCOPY WITH LEFT STENT PLACEMENT, BLADDER REPAIR, CYSTOSCOPY WITH LEFT STENT REMOVAL;  Surgeon: Raynelle Bring, MD;  [facility];  Service: Urology;  Laterality: Left;  . EXCISION OF ACCESSORY NIPPLE Bilateral 05/30/2013   Procedure: BILATERAL NIPPLE BIOPSY;  Surgeon: Merrie Roof, MD;  Location: [facility];  Service: General;  Laterality: Bilateral;  . EYE SURGERY Bilateral 12  cataracts  . IR RADIOLOGIST EVAL & MGMT  07/26/2017  . LAPAROSCOPIC RIGHT COLECTOMY N/A 09/04/2018   Procedure: LAPAROSCOPIC ASSISTED SIGMOID COLECTOMY WITH REPAIR OF FISTULA TO BLADDER;  Surgeon: Jovita Kussmaul, MD;  [facility];  Service: General;  Laterality: N/A;  . SPLIT NIGHT STUDY  02/02/2016        Short Social History:  Social History   Tobacco Use  . Smoking status: Never Smoker  . Smokeless tobacco: Never Used  Substance Use Topics  . Alcohol use: No    Allergies  Allergen Reactions  . Contrast Media [Iodinated Diagnostic Agents] Hives and Other (See Comments)    Per pt strong burning sensation starting in chest radiating outward   . Clonidine Derivatives Other (See Comments)    Throat dry  . Statins Other (See Comments)    "bones hurt"  . Sulfa Antibiotics Diarrhea    Tremors   . Celebrex [Celecoxib] Rash  . Isosorbide Nitrate Itching and Rash  .  Other Itching and Rash    Plastic and paper tape and heart monitor pads  . Tape Itching and Rash    Red Where applied and will spread    Current Outpatient Medications  Medication Sig Dispense Refill  . acetaminophen (TYLENOL) 500 MG tablet Take 500-1,000 mg by mouth every 6 (six) hours as needed for headache (pain).    Marland Kitchen amLODipine (NORVASC) 5 MG tablet TAKE 1 & 1/2 (ONE & ONE-HALF) TABLETS BY MOUTH ONCE DAILY 135 tablet 2  . clonazePAM (KLONOPIN) 0.5 MG tablet TAKE 1 TABLET BY MOUTH TWICE DAILY AS NEEDED FOR ANXIETY OR  SLEEP 60 tablet 0  . cloNIDine (CATAPRES) 0.1 MG tablet Take 1 tablet by mouth twice daily 180 tablet 2  . clotrimazole-betamethasone (LOTRISONE) cream Apply 1 application topically 2 (two) times daily. 30 g 0  . ELIQUIS 5 MG TABS tablet Take 1 tablet by mouth twice daily 180 tablet 1  . famotidine (PEPCID) 20 MG tablet One at bedtime 30 tablet 11  . losartan (COZAAR) 100 MG tablet Take 1 tablet by mouth once daily 90 tablet 3  . nitroGLYCERIN (NITROSTAT) 0.4 MG SL tablet DISSOLVE ONE TABLET UNDER THE TONGUE EVERY 5 MINUTES AS NEEDED FOR CHEST PAIN.  DO NOT EXCEED A TOTAL OF 3 DOSES IN 15 MINUTES 25 tablet 0  . spironolactone (ALDACTONE) 25 MG tablet Take 1/2 (one-half) tablet by mouth once daily 45 tablet 3  . torsemide (DEMADEX) 20 MG tablet Take 3 tablets by mouth once daily (Patient taking differently: 40 mg daily. ) 90 tablet 10   No current facility-administered medications for this visit.    Review of Systems  Constitutional: Negative for chills, fatigue, fever and unexpected weight change.  HENT: Negative for trouble swallowing.  Eyes: Negative for loss of vision.  Respiratory: Negative for cough, shortness of breath and wheezing.  Cardiovascular: Negative for chest pain, leg swelling, palpitations and syncope.  GI: Negative for abdominal pain, blood in stool, diarrhea, nausea and vomiting.  GU: Negative for difficulty urinating, dysuria, frequency and  hematuria.  Musculoskeletal: Negative for back pain, leg pain and joint pain.  Skin: Negative for rash.  Neurological: Negative for dizziness, headaches, light-headedness, numbness and seizures.  Psychiatric: Negative for behavioral problem, confusion, depressed mood and sleep disturbance.        Objective:  Objective   Vitals:   03/24/20 1549  BP: 132/70  Pulse: 66  Resp: 18  SpO2: 98%  Weight: 189 lb 9.6 oz (86 kg)  Height: 5\' 6"  (1.676 m)   Body mass index is 30.6 kg/m.  Physical Exam Vitals and nursing note reviewed.  Constitutional:      Appearance: She is well-developed.  HENT:     Head: Normocephalic and atraumatic.  Eyes:     Pupils: Pupils are equal, round, and reactive to light.  Cardiovascular:     Rate and Rhythm: Normal rate and regular rhythm.  Pulmonary:     Effort: Pulmonary effort is normal. No respiratory distress.  Skin:    General: Skin is warm and dry.  Neurological:     Mental Status: She is alert and oriented to person, place, and time.  Psychiatric:        Behavior: Behavior normal.        Thought Content: Thought content normal.        Judgment: Judgment normal.         Assessment/Plan:     85 year old with postmenopausal bleeding Pelvic ultrasound today shows irregular thickening of the endometrium.  Suspicious for uterine endometrial polyp versus hyperplasia versus neoplasm.  Discussed with patient options for office biopsy versus hysteroscopy D&C with polypectomy.  Patient reports that she has a lot going on at her home and is taking care of her elderly husband who is 71 and her son who is 52 and recently broke his arm.  For this reason she would like to delay this procedure.  Reviewed the images with the patient and encouraged her to try and follow-up in the next 1 to 3 months to have the hysteroscopy D&C or office biopsy performed.  More than 25 minutes were spent face to face with the patient in the room, reviewing the medical  record, labs and images, and coordinating care for the patient. The plan of management was discussed in detail and counseling was provided.    Adrian Prows MD [facility], [facility] 03/24/2020 4:09 PM

## 2020-03-25 ENCOUNTER — Other Ambulatory Visit: Payer: Self-pay

## 2020-03-25 ENCOUNTER — Other Ambulatory Visit: Payer: Medicare Other

## 2020-03-25 ENCOUNTER — Other Ambulatory Visit: Payer: Self-pay | Admitting: Family Medicine

## 2020-03-25 ENCOUNTER — Other Ambulatory Visit: Payer: Self-pay | Admitting: Cardiology

## 2020-03-25 DIAGNOSIS — E875 Hyperkalemia: Secondary | ICD-10-CM

## 2020-03-25 DIAGNOSIS — K5792 Diverticulitis of intestine, part unspecified, without perforation or abscess without bleeding: Secondary | ICD-10-CM

## 2020-03-25 DIAGNOSIS — F064 Anxiety disorder due to known physiological condition: Secondary | ICD-10-CM

## 2020-03-25 NOTE — Telephone Encounter (Signed)
Requested Prescriptions   Pending Prescriptions Disp Refills  . clonazePAM (KLONOPIN) 0.5 MG tablet [Pharmacy Med Name: clonazePAM 0.5 MG Oral Tablet] 60 tablet 0    Sig: Take 1 tablet by mouth twice daily as needed for anxiety     Last OV 03/14/2020   Last written 01/31/2020

## 2020-03-25 NOTE — Telephone Encounter (Signed)
Eliquis 5mg  refill request received. Patient is 86 years old, weight-86kg, Crea-1.22 on 03/14/2020, Diagnosis-Afib, and last seen by Dr. [name] on 09/28/2019. Dose is appropriate based on dosing criteria. Will send in refill to requested pharmacy.

## 2020-03-26 LAB — BASIC METABOLIC PANEL
BUN/Creatinine Ratio: 30 (calc) — ABNORMAL HIGH (ref 6–22)
BUN: 34 mg/dL — ABNORMAL HIGH (ref 7–25)
CO2: 26 mmol/L (ref 20–32)
Calcium: 9.4 mg/dL (ref 8.6–10.4)
Chloride: 98 mmol/L (ref 98–110)
Creat: 1.12 mg/dL — ABNORMAL HIGH (ref 0.60–0.88)
Glucose, Bld: 120 mg/dL — ABNORMAL HIGH (ref 65–99)
Potassium: 4.3 mmol/L (ref 3.5–5.3)
Sodium: 137 mmol/L (ref 135–146)

## 2020-04-10 ENCOUNTER — Telehealth: Payer: Self-pay | Admitting: Obstetrics and Gynecology

## 2020-04-11 ENCOUNTER — Telehealth: Payer: Self-pay | Admitting: Obstetrics and Gynecology

## 2020-04-11 NOTE — Telephone Encounter (Signed)
-----   Message from Homero Fellers, MD sent at 03/24/2020  4:18 PM EDT ----- Surgery Booking Request Patient Full Name:  Sharina Petre Gropp  MRN: [id number]  DOB: 03/13/1935  Surgeon: Homero Fellers, MD  Requested Surgery Date and Time: - Unknown- see note Primary Diagnosis AND Code: postmenopausal bleeding N95. 0, endometrial polyp N84.0 Secondary Diagnosis and Code:  Surgical Procedure: Hysteroscopy D&C with possible polypectomy RNFA Requested?: No L&D Notification: No Admission Status: same day surgery Length of Surgery: 50 min Special Case Needs: No H&P: Yes Phone Interview???:  No Interpreter: No Medical Clearance:  Yes- cardiac clearance Special Scheduling Instructions: Yes: patient would like to delay procedure for 1-3 months, caring for her husband and son Any known health/anesthesia issues, diabetes, sleep apnea, latex allergy, defibrillator/pacemaker?: Yes Acuity: P3   (P1 highest, P2 delay [place] cause harm, P3 low, elective gyn, P4 lowest)

## 2020-04-11 NOTE — Telephone Encounter (Signed)
Pt returned call to schedule Hysteroscopy D&C w possible polypectomy with [facility]   DOS 8/26  H&P 8/17 @ 11:30   Covid testing 8/24 @ 8-10:30, [address], drive up and wear mask. Advised pt to quarantine until DOS.  Pre-admit phone call appointment to be requested - date and time will be included on H&P paper work. Explained that this appointment has a call window. Based on the time scheduled will indicate if the call will be received within a 4 hour window before 1:00 or after.  Advised that pt [place] also receive calls from the hospital pharmacy and pre-service center.  Confirmed pt has [employer] as primary insurance. No secondary insurance.  Cardiac clearance is requested prior to surgery. Pt adv she sees Dr. Tressia Miners Turner/Cardiology. She has an appt scheduled for 8/12 @ 1:00.

## 2020-04-14 ENCOUNTER — Telehealth: Payer: Self-pay | Admitting: Obstetrics and Gynecology

## 2020-04-14 NOTE — Telephone Encounter (Signed)
Pt called and wanted to reschedule her procedure w Schuman to mid September.  DOS 9/23   H&P 9/14 @ 2:50   Covid testing 9/21 @ 8-10:30, [facility] [employer], drive up and wear mask. Advised pt to quarantine until DOS.  Pre-admit phone call appointment to be requested - date and time will be included on H&P paper work. Also all appointments will be updated on pt MyChart. Explained that this appointment has a call window. Based on the time scheduled will indicate if the call will be received within a 4 hour window before 1:00 or after.  Advised that pt [place] also receive calls from the hospital pharmacy and pre-service center.  Confirmed pt has [employer] as primary insurance. No secondary insurance.

## 2020-04-17 ENCOUNTER — Other Ambulatory Visit: Payer: Self-pay

## 2020-04-17 ENCOUNTER — Other Ambulatory Visit: Payer: Self-pay | Admitting: Cardiology

## 2020-04-17 ENCOUNTER — Ambulatory Visit: Payer: Medicare Other | Admitting: Cardiology

## 2020-04-17 ENCOUNTER — Encounter: Payer: Self-pay | Admitting: Cardiology

## 2020-04-17 VITALS — BP 144/60 | HR 67 | Ht 66.0 in | Wt 186.4 lb

## 2020-04-17 DIAGNOSIS — I10 Essential (primary) hypertension: Secondary | ICD-10-CM | POA: Diagnosis not present

## 2020-04-17 DIAGNOSIS — I2510 Atherosclerotic heart disease of native coronary artery without angina pectoris: Secondary | ICD-10-CM | POA: Diagnosis not present

## 2020-04-17 DIAGNOSIS — I350 Nonrheumatic aortic (valve) stenosis: Secondary | ICD-10-CM

## 2020-04-17 DIAGNOSIS — I5032 Chronic diastolic (congestive) heart failure: Secondary | ICD-10-CM

## 2020-04-17 DIAGNOSIS — I272 Pulmonary hypertension, unspecified: Secondary | ICD-10-CM

## 2020-04-17 DIAGNOSIS — E785 Hyperlipidemia, unspecified: Secondary | ICD-10-CM

## 2020-04-17 DIAGNOSIS — I4821 Permanent atrial fibrillation: Secondary | ICD-10-CM

## 2020-04-17 DIAGNOSIS — I5181 Takotsubo syndrome: Secondary | ICD-10-CM

## 2020-04-17 DIAGNOSIS — R001 Bradycardia, unspecified: Secondary | ICD-10-CM

## 2020-04-17 NOTE — Addendum Note (Signed)
Addended by: Antonieta Iba on: 04/17/2020 01:25 PM   Modules accepted: Orders

## 2020-04-17 NOTE — Progress Notes (Signed)
Cardiology Office Note:    Date:  04/17/2020   ID:  [name], DOB 22-Jun-1935, MRN [id number]  PCP:  [name], MD  Cardiologist:  Fransico Him, MD    Referring MD: [name], MD   Chief Complaint  Patient presents with  . Cardiomyopathy  . Hypertension  . Coronary Artery Disease  . Hyperlipidemia  . Aortic Stenosis  . Atrial Fibrillation    History of Present Illness:    [name] is a 85 y.o. female with a hx of Takotsubo cardiomyopathy EF 35 to 40% in 2016 with recovered EF, nonobstructive CAD on cath in 2016, hypertension, chronic diastolic CHF, pulmonary hypertension, mild aortic stenosis, permanent atrial fibrillation on Eliquis.  History of bradycardia 02/2019 at which time metoprolol was discontinued  Discharge from the hospital 07/24/2019 her observation for chest pain felt to be atypical.  Troponins negative, EKG unchanged.  Lexiscan Myoview 07/24/2019 no reversible ischemia likely breast attenuation artifact EF 70%.  She was seen by Estella Husk, PA in Dec 2020 due to elevated BP at PCP office. She had an echo that same day which showed normal LVF with mild LVH, moderate biatrial enlargement and mild MR, mild AS and pulmonary HTN which had improved from prior echo and BP was controlled at this OV.    She is here today for followup and is doing well.  She denies any exertional chest pain or pressure,  PND, orthopnea,  dizziness, palpitations or syncope. She has chronic mild DOE which is stable.  She has occasional LE edema controlled on diuretics. She is compliant with her meds and is tolerating meds with no SE.    Past Medical History:  Diagnosis Date  . Allergy    rhinitis  . Aortic stenosis    mild by echo 06/2017  . Arthritis   . Bradycardia    a. 10/2017 -> beta blocker cut back due to HR 39.  . Breast cancer ([place]) 01/06/2012  . Cancer [facility])    right colon and left breast  . Chronic diastolic CHF (congestive heart failure) ([place])   . Colon  cancer ([place]) 01/06/2012  . Colovesical fistula    Dr. Marlou Starks and [name] planning surgery 08/2018- surgery revealed spontaneous closure  . COPD (chronic obstructive pulmonary disease) ([place])    pt. denies  . Coronary artery disease 2006   a.  NSTEMI in 2016, cath showed 15% prox-mid RCA, 20% prox LAD, EF 25-35% by cath and 35-40% -> felt due to Takotsubo cardiomyopathy.  . Dilated aortic root ([place])    9mmHg by echo 06/2017  . Diverticulosis   . Dyspnea   . Edema extremities   . GERD (gastroesophageal reflux disease)   . Hernia   . Hiatal hernia    denies  . Hyperlipidemia   . Hypertension   . Mild aortic stenosis    echo 11/2015 but not noted on echo 06/2016  . Osteopenia   . Permanent atrial fibrillation (HCC)    chronic atrial fibrillation  . Pneumonia    hx child  . Pulmonary HTN ([place])    a. moderate to severe PASP 7mmHg echo 11/2015 - now 72mmHg by echo 06/2017. CTA chest in 11/16 with no PE. PFTs in 7/15 with mild obstructive lung disease. She had a negative sleep study in 2017. b. Felt due to left sided HF.  Marland Kitchen Stroke ([place])   . Takotsubo syndrome 07/29/2015   a. EF 35-40% by echo; akinesis of mid-apical anteroseptal and  apical myocardium.  EF now normalized on echo 11/2015    Past Surgical History:  Procedure Laterality Date  . APPENDECTOMY    . BREAST SURGERY     lumpectomy left  . CARDIAC CATHETERIZATION    . CARDIAC CATHETERIZATION N/A 07/28/2015   Procedure: Left Heart Cath and Coronary Angiography;  Surgeon: Peter M Martinique, MD;  Location: [place] [facility];  Service: Cardiovascular;  Laterality: N/A;  . CHOLECYSTECTOMY    . COLECTOMY     right side  . CYSTOSCOPY WITH STENT PLACEMENT Left 09/04/2018   Procedure: CYSTOSCOPY WITH LEFT STENT PLACEMENT, BLADDER REPAIR, CYSTOSCOPY WITH LEFT STENT REMOVAL;  Surgeon: Raynelle Bring, MD;  [facility];  Service: Urology;  Laterality: Left;  . EXCISION OF ACCESSORY NIPPLE Bilateral 05/30/2013   Procedure: BILATERAL  NIPPLE BIOPSY;  Surgeon: Merrie Roof, MD;  Location: [facility];  Service: General;  Laterality: Bilateral;  . EYE SURGERY Bilateral 12   cataracts  . IR RADIOLOGIST EVAL & MGMT  07/26/2017  . LAPAROSCOPIC RIGHT COLECTOMY N/A 09/04/2018   Procedure: LAPAROSCOPIC ASSISTED SIGMOID COLECTOMY WITH REPAIR OF FISTULA TO BLADDER;  Surgeon: Autumn Messing III, MD;  [facility];  Service: General;  Laterality: N/A;  . SPLIT NIGHT STUDY  02/02/2016        Current Medications: Current Meds  Medication Sig  . acetaminophen (TYLENOL) 500 MG tablet Take 500-1,000 mg by mouth every 6 (six) hours as needed for headache (pain).  Marland Kitchen amLODipine (NORVASC) 5 MG tablet TAKE 1 & 1/2 (ONE & ONE-HALF) TABLETS BY MOUTH ONCE DAILY  . clonazePAM (KLONOPIN) 0.5 MG tablet Take 1 tablet by mouth twice daily as needed for anxiety  . cloNIDine (CATAPRES) 0.1 MG tablet Take 1 tablet by mouth twice daily  . clotrimazole-betamethasone (LOTRISONE) cream Apply 1 application topically 2 (two) times daily.  Marland Kitchen ELIQUIS 5 MG TABS tablet Take 1 tablet by mouth twice daily  . famotidine (PEPCID) 20 MG tablet One at bedtime  . losartan (COZAAR) 100 MG tablet Take 1 tablet by mouth once daily  . nitroGLYCERIN (NITROSTAT) 0.4 MG SL tablet DISSOLVE ONE TABLET UNDER THE TONGUE EVERY 5 MINUTES AS NEEDED FOR CHEST PAIN.  DO NOT EXCEED A TOTAL OF 3 DOSES IN 15 MINUTES  . torsemide (DEMADEX) 20 MG tablet Take 3 tablets by mouth once daily (Patient taking differently: 40 mg daily. )     Allergies:   Contrast media [iodinated diagnostic agents], Clonidine derivatives, Statins, Sulfa antibiotics, Celebrex [celecoxib], Isosorbide nitrate, Other, and Tape   Social History   Socioeconomic History  . Marital status: Married    Spouse name: Not on file  . Number of children: Not on file  . Years of education: Not on file  . Highest education level: Not on file  Occupational History  . Not on file  Tobacco Use  . Smoking status: Never Smoker   . Smokeless tobacco: Never Used  Vaping Use  . Vaping Use: Never used  Substance and Sexual Activity  . Alcohol use: No  . Drug use: No  . Sexual activity: Not Currently  Other Topics Concern  . Not on file  Social History Narrative  . Not on file   Social Determinants of Health   Financial Resource Strain:   . Difficulty of Paying Living Expenses:   Food Insecurity:   . Worried About Charity fundraiser in the Last Year:   . [place] in the Last Year:  Transportation Needs:   . Film/video editor (Medical):   Marland Kitchen Lack of Transportation (Non-Medical):   Physical Activity:   . Days of Exercise per Week:   . Minutes of Exercise per Session:   Stress:   . Feeling of Stress :   Social Connections:   . Frequency of Communication with Friends and Family:   . Frequency of Social Gatherings with Friends and Family:   . Attends Religious Services:   . Active Member of Clubs or Organizations:   . Attends Archivist Meetings:   Marland Kitchen Marital Status:      Family History: The patient's family history includes Cancer in her sister and sister; Heart attack in her mother; Heart disease in her mother; Heart disease (age of onset: 75) in her brother; Hypertension in her father. There is no history of Stroke.  ROS:   Please see the history of present illness.    ROS  All other systems reviewed and negative.   EKGs/Labs/Other Studies Reviewed:    The following studies were reviewed today: Labs, 2D echo, office notes  EKG:  EKG is not ordered today.   Recent Labs: 07/23/2019: B Natriuretic Peptide 66.5 02/27/2020: ALT 14; Hemoglobin 10.3; Platelets 253 03/25/2020: BUN 34; Creat 1.12; Potassium 4.3; Sodium 137   Recent Lipid Panel    Component Value Date/Time   CHOL 145 02/14/2018 0927   TRIG 65 02/14/2018 0927   HDL 51 02/14/2018 0927   CHOLHDL 2.8 02/14/2018 0927   CHOLHDL 3.5 03/15/2016 1057   VLDL 23 03/15/2016 1057   LDLCALC 81 02/14/2018 0927     Physical Exam:    VS:  BP (!) 144/60   Pulse 67   Ht 5\' 6"  (1.676 m)   Wt 186 lb 6.4 oz (84.6 kg)   SpO2 95%   BMI 30.09 kg/m     Wt Readings from Last 3 Encounters:  04/17/20 186 lb 6.4 oz (84.6 kg)  03/24/20 189 lb 9.6 oz (86 kg)  03/14/20 179 lb (81.2 kg)     GEN: Well nourished, well developed in no acute distress HEENT: Normal NECK: No JVD; No carotid bruits LYMPHATICS: No lymphadenopathy CARDIAC:RRR, no  rubs, gallops.  2/6 SM at RUSB to LLSB RESPIRATORY:  Clear to auscultation without rales, wheezing or rhonchi  ABDOMEN: Soft, non-tender, non-distended MUSCULOSKELETAL:  No edema; No deformity  SKIN: Warm and dry NEUROLOGIC:  Alert and oriented x 3 PSYCHIATRIC:  Normal affect    ASSESSMENT:    1. Takotsubo syndrome   2. Pulmonary HTN ([place])   3. Essential hypertension   4. Coronary artery disease involving native coronary artery of native heart without angina pectoris   5. Bradycardia   6. Chronic diastolic CHF (congestive heart failure) ([place])   7. Nonrheumatic aortic valve stenosis   8. Permanent atrial fibrillation ([place])   9. Dyslipidemia, goal LDL below 70    PLAN:    In order of problems listed above:  1.  Takotsubo DCM -resolved with EF 60-65% by echo 08/2018 and 08/2019  2.  Pulmonary HTN -PASP 48mmHg on echo 08/2019 with associated moderate to severe TR -felt secondary to [facility] 2 from diastolic CHF -PASP has been has high as the 60's in 2017 -continue diuretics -repeat 2D echo in 1 year to make sure this stays stable  3.  HTN -BP borderline controlled -continue amlodipine 7.5mg  daily, Clonidine 0.1mg  BID, Losartan 100mg  daily and spiro 12.5mg  daily -creatinine 0.93 in Nov 2020  4.  ASCAD -  non obstructive by cath 2016 -she denies any anginal sx -no ASA due to Eliquis -no BB due to bradycardia -statin intolerant  5. Bradycardia -resolved off BB  6.  Chronic diastolic CHF -she does not appear volume overloaded on exam -denies  any SOB or LE edema -continue Torsemide 60mg  daily  7.  Aortic stenosis -mild by echo 08/2019  8.  Permanent atrial fibrillation -denies any palpitations or bleeding problems on DOAC -continue Eliquis 5mg  BID (weifght>60kg and SCr < 1.5) -no BB due to bradycardia -SCr 1.12 in July and Hbg 10.3 in June  9.  HLD -LDL goal < 70 -LDL was 81 in 2019 -repeat FLP and ALT -intolerant to statin   Medication Adjustments/Labs and Tests Ordered: Current medicines are reviewed at length with the patient today.  Concerns regarding medicines are outlined above.  No orders of the defined types were placed in this encounter.  No orders of the defined types were placed in this encounter.   Signed, Fransico Him, MD  04/17/2020 1:13 PM    [place]

## 2020-04-17 NOTE — Patient Instructions (Signed)
Medication Instructions:  Your physician recommends that you continue on your current medications as directed. Please refer to the Current Medication list given to you today.  *If you need a refill on your cardiac medications before your next appointment, please call your pharmacy*   Lab Work: Fasting lipids and ALT If you have labs (blood work) drawn today and your tests are completely normal, you will receive your results only by: Marland Kitchen MyChart Message (if you have MyChart) OR . A paper copy in the mail If you have any lab test that is abnormal or we need to change your treatment, we will call you to review the results.   Testing/Procedures: Your physician has requested that you have an echocardiogram in December 2021. Echocardiography is a painless test that uses sound waves to create images of your heart. It provides your doctor with information about the size and shape of your heart and how well your heart's chambers and valves are working. This procedure takes approximately one hour. There are no restrictions for this procedure.   Follow-Up: At [facility], you and your health needs are our priority.  As part of our continuing mission to provide you with exceptional heart care, we have created designated Provider Care Teams.  These Care Teams include your primary Cardiologist (physician) and Advanced Practice Providers (APPs -  Physician Assistants and Nurse Practitioners) who all work together to provide you with the care you need, when you need it.  We recommend signing up for the patient portal called "MyChart".  Sign up information is provided on this After Visit Summary.  MyChart is used to connect with patients for Virtual Visits (Telemedicine).  Patients are able to view lab/test results, encounter notes, upcoming appointments, etc.  Non-urgent messages can be sent to your provider as well.   To learn more about what you can do with MyChart, go to [email or web address].    Your  next appointment:   6 month(s)  The format for your next appointment:   In Person  Provider:   You Quesnell see Fransico Him, MD or one of the following Advanced Practice Providers on your designated Care Team:    Melina Copa, PA-C  Ermalinda Barrios, PA-C

## 2020-04-21 ENCOUNTER — Other Ambulatory Visit: Payer: Self-pay

## 2020-04-21 ENCOUNTER — Other Ambulatory Visit: Payer: Medicare Other | Admitting: *Deleted

## 2020-04-21 DIAGNOSIS — E785 Hyperlipidemia, unspecified: Secondary | ICD-10-CM | POA: Diagnosis not present

## 2020-04-21 LAB — LIPID PANEL
Chol/HDL Ratio: 2.2 ratio (ref 0.0–4.4)
Cholesterol, Total: 149 mg/dL (ref 100–199)
HDL: 68 mg/dL (ref >39)
LDL Chol Calc (NIH): 71 mg/dL (ref 0–99)
Triglycerides: 45 mg/dL (ref 0–149)
VLDL Cholesterol Cal: 10 mg/dL (ref 5–40)

## 2020-04-21 LAB — ALT: ALT: 13 IU/L (ref 0–32)

## 2020-04-22 ENCOUNTER — Encounter: Payer: Medicare Other | Admitting: Obstetrics and Gynecology

## 2020-04-22 NOTE — Telephone Encounter (Signed)
No additional notes

## 2020-05-08 ENCOUNTER — Other Ambulatory Visit: Payer: Self-pay | Admitting: Family Medicine

## 2020-05-08 DIAGNOSIS — F064 Anxiety disorder due to known physiological condition: Secondary | ICD-10-CM

## 2020-05-08 DIAGNOSIS — K5792 Diverticulitis of intestine, part unspecified, without perforation or abscess without bleeding: Secondary | ICD-10-CM

## 2020-05-09 NOTE — Telephone Encounter (Signed)
Ok to refill??  Last office visit 03/14/2020.  Last refill 03/25/2020.  Ok to add refills to prescription?

## 2020-05-15 DIAGNOSIS — Z23 Encounter for immunization: Secondary | ICD-10-CM | POA: Diagnosis not present

## 2020-05-15 DIAGNOSIS — Z7901 Long term (current) use of anticoagulants: Secondary | ICD-10-CM | POA: Diagnosis not present

## 2020-05-15 DIAGNOSIS — S51811A Laceration without foreign body of right forearm, initial encounter: Secondary | ICD-10-CM | POA: Diagnosis not present

## 2020-05-19 ENCOUNTER — Telehealth: Payer: Self-pay | Admitting: *Deleted

## 2020-05-19 NOTE — Telephone Encounter (Signed)
° °   [facility] Pre-operative Risk Assessment    HEARTCARE STAFF: - Please ensure there is not already an duplicate clearance open for this procedure. - Under Visit Info/Reason for Call, type in Other and utilize the format Clearance MM/DD/YY or Clearance TBD. Do not use dashes or single digits. - If request is for dental extraction, please clarify the # of teeth to be extracted.  Request for surgical clearance:  1. What type of surgery is being performed?  HYSTEROSCOPY D & C   2. When is this surgery scheduled?   05/29/20  3. What type of clearance is required (medical clearance vs. Pharmacy clearance to hold med vs. Both)?  BOTH  4. Are there any medications that need to be held prior to surgery and how long? ELIQUIS    5. Practice name and name of physician performing surgery?  [facility] / DR. Chauncey Fischer   6. What is the office phone number?  [phone]   7.   What is the office fax number?  [phone]  8.   Anesthesia type (None, local, MAC, general) ?    Jeanann Lewandowsky 05/19/2020, 1:29 PM  _________________________________________________________________   (provider comments below)

## 2020-05-19 NOTE — Telephone Encounter (Signed)
Patient with diagnosis of afib on Eliquis for anticoagulation.    Procedure: hysteroscopy D and C Date of procedure: 05/29/20  CHADS2-VASc score of 8 (age x2, sex, CHF, HTN, CAD, TIA)  CrCl 9mL/min Platelet count 253K  Per office protocol, patient can hold Eliquis for 1 day prior to procedure. If > 24 hour hold is required, will need physician input due to elevated CV risk.

## 2020-05-19 NOTE — Telephone Encounter (Signed)
Clinical pharmacist to review Eliquis 

## 2020-05-20 ENCOUNTER — Ambulatory Visit (INDEPENDENT_AMBULATORY_CARE_PROVIDER_SITE_OTHER): Payer: Medicare Other | Admitting: Obstetrics and Gynecology

## 2020-05-20 ENCOUNTER — Encounter: Payer: Self-pay | Admitting: Obstetrics and Gynecology

## 2020-05-20 ENCOUNTER — Other Ambulatory Visit: Payer: Self-pay

## 2020-05-20 VITALS — BP 128/74 | HR 50 | Resp 18 | Ht 66.0 in | Wt 188.2 lb

## 2020-05-20 DIAGNOSIS — N950 Postmenopausal bleeding: Secondary | ICD-10-CM | POA: Diagnosis not present

## 2020-05-20 DIAGNOSIS — N840 Polyp of corpus uteri: Secondary | ICD-10-CM

## 2020-05-20 NOTE — Patient Instructions (Addendum)
Stop Eliquis one day before surgery.  Restart Eliquis the day after surgery.    Hysteroscopy Hysteroscopy is a procedure that is used to examine the inside of a woman's womb (uterus). This Sutphen be done for various reasons, including:  To look for lumps (tumors) and other growths in the uterus.  To evaluate abnormal bleeding, fibroid tumors, polyps, scar tissue (adhesions), or cancer of the uterus.  To determine the cause of an inability to get pregnant (infertility) or repeated losses of pregnancies (miscarriages).  To find a lost IUD (intrauterine device).  To perform a procedure that permanently prevents pregnancy (sterilization). During this procedure, a thin, flexible tube with a small light and camera (hysteroscope) is used to examine the uterus. The camera sends images to a monitor in the room so that your health care provider can view the inside of your uterus. A hysteroscopy should be done right after a menstrual period to make sure that you are not pregnant. Tell a health care provider about:  Any allergies you have.  All medicines you are taking, including vitamins, herbs, eye drops, creams, and over-the-counter medicines.  Any problems you or family members have had with the use of anesthetic medicines.  Any blood disorders you have.  Any surgeries you have had.  Any medical conditions you have.  Whether you are pregnant or Gover be pregnant. What are the risks? Generally, this is a safe procedure. However, problems Dittrich occur, including:  Excessive bleeding.  Infection.  Damage to the uterus or other structures or organs.  Allergic reaction to medicines or fluids that are used in the procedure. What happens before the procedure? Staying hydrated Follow instructions from your health care provider about hydration, which Delsignore include:  Up to 2 hours before the procedure - you Coachman continue to drink clear liquids, such as water, clear fruit juice, black coffee, and  plain tea. Eating and drinking restrictions Follow instructions from your health care provider about eating and drinking, which Kulish include:  8 hours before the procedure - stop eating solid foods and drink clear liquids only  2 hours before the procedure - stop drinking clear liquids. General instructions  Ask your health care provider about: ? Changing or stopping your normal medicines. This is important if you take diabetes medicines or blood thinners. ? Taking medicines such as aspirin and ibuprofen. These medicines can thin your blood and cause bleeding. Do not take these medicines for 1 week before your procedure, or as told by your health care provider.  Do not use any products that contain nicotine or tobacco for 2 weeks before the procedure. This includes cigarettes and e-cigarettes. If you need help quitting, ask your health care provider.  Medicine Philley be placed in your cervix the day before the procedure. This medicine causes the cervix to have a larger opening (dilate). The larger opening makes it easier for the hysteroscope to be inserted into the uterus during the procedure.  Plan to have someone with you for the first 24-48 hours after the procedure, especially if you are given a medicine to make you fall asleep (general anesthetic).  Plan to have someone take you home from the hospital or clinic. What happens during the procedure?  To lower your risk of infection: ? Your health care team will wash or sanitize their hands. ? Your skin will be washed with soap. ? Hair Mancuso be removed from the surgical area.  An IV tube will be inserted into one of your veins.  You Speir be given one or more of the following: ? A medicine to help you relax (sedative). ? A medicine that numbs the area around the cervix (local anesthetic). ? A medicine to make you fall asleep (general anesthetic).  A hysteroscope will be inserted through your vagina and into your uterus.  Air or fluid  will be used to enlarge your uterus, enabling your health care provider to see your uterus better. The amount of fluid used will be carefully checked throughout the procedure.  In some cases, tissue Lame be gently scraped from inside the uterus and sent to a lab for testing (biopsy). The procedure Sensabaugh vary among health care providers and hospitals. What happens after the procedure?  Your blood pressure, heart rate, breathing rate, and blood oxygen level will be monitored until the medicines you were given have worn off.  You Marchi have some cramping. You Matthis be given medicines for this.  You Njie have bleeding, which varies from light spotting to menstrual-like bleeding. This is normal.  If you had a biopsy done, it is your responsibility to get the results of your procedure. Ask your health care provider, or the department performing the procedure, when your results will be ready. Summary  Hysteroscopy is a procedure that is used to examine the inside of a woman's womb (uterus).  After the procedure, you Belloso have bleeding, which varies from light spotting to menstrual-like bleeding. This is normal. You Maday also have cramping.  Plan to have someone take you home from the hospital or clinic. This information is not intended to replace advice given to you by your health care provider. Make sure you discuss any questions you have with your health care provider. Document Revised: 08/05/2017 Document Reviewed: 09/21/2016 Elsevier Patient Education  [place].

## 2020-05-20 NOTE — Progress Notes (Signed)
Patient ID: [name], female   DOB: [birth year], 85 y.o.   MRN: [id number]  Reason for Consult: Pre-op Exam (H&P )   Referred by [name], MD  Subjective:     HPI:  [name] is a 85 y.o. female . She has a planned hysteroscopy for irregular thickened endometrium and postmenopausal bleeding planned for 05/29/2020, suspected endometrial polyp. She reports no further bleeding since her last visit.    Past Medical History:  Diagnosis Date  . Allergy    rhinitis  . Aortic stenosis    mild by echo 06/2017  . Arthritis   . Bradycardia    a. 10/2017 -> beta blocker cut back due to HR 39.  . Breast cancer ([place]) 01/06/2012  . Cancer [facility])    right colon and left breast  . Chronic diastolic CHF (congestive heart failure) ([place])   . Colon cancer ([place]) 01/06/2012  . Colovesical fistula    Dr. Marlou Starks and [name] planning surgery 08/2018- surgery revealed spontaneous closure  . COPD (chronic obstructive pulmonary disease) ([place])    pt. denies  . Coronary artery disease 2006   a.  NSTEMI in 2016, cath showed 15% prox-mid RCA, 20% prox LAD, EF 25-35% by cath and 35-40% -> felt due to Takotsubo cardiomyopathy.  . Dilated aortic root ([place])    52mmHg by echo 06/2017  . Diverticulosis   . Dyspnea   . Edema extremities   . GERD (gastroesophageal reflux disease)   . Hernia   . Hiatal hernia    denies  . Hyperlipidemia   . Hypertension   . Mild aortic stenosis    echo 11/2015 but not noted on echo 06/2016  . Osteopenia   . Permanent atrial fibrillation (HCC)    chronic atrial fibrillation  . Pneumonia    hx child  . Pulmonary HTN ([place])    a. moderate to severe PASP 73mmHg echo 11/2015 - now 90mmHg by echo 06/2017. CTA chest in 11/16 with no PE. PFTs in 7/15 with mild obstructive lung disease. She had a negative sleep study in 2017. b. Felt due to left sided HF.  Marland Kitchen Stroke ([place])   . Takotsubo syndrome 07/29/2015   a. EF 35-40% by echo; akinesis of mid-apical anteroseptal and apical  myocardium.  EF now normalized on echo 11/2015   Family History  Problem Relation Age of Onset  . Heart disease Mother   . Heart attack Mother   . Cancer Sister        stomach and colon  . Heart disease Brother 68  . Hypertension Father   . Cancer Sister   . Stroke Neg Hx    Past Surgical History:  Procedure Laterality Date  . APPENDECTOMY    . BREAST SURGERY     lumpectomy left  . CARDIAC CATHETERIZATION    . CARDIAC CATHETERIZATION N/A 07/28/2015   Procedure: Left Heart Cath and Coronary Angiography;  Surgeon: Peter M Martinique, MD;  Location: [facility];  Service: Cardiovascular;  Laterality: N/A;  . CHOLECYSTECTOMY    . COLECTOMY     right side  . CYSTOSCOPY WITH STENT PLACEMENT Left 09/04/2018   Procedure: CYSTOSCOPY WITH LEFT STENT PLACEMENT, BLADDER REPAIR, CYSTOSCOPY WITH LEFT STENT REMOVAL;  Surgeon: Raynelle Bring, MD;  Location: [facility];  Service: Urology;  Laterality: Left;  . EXCISION OF ACCESSORY NIPPLE Bilateral 05/30/2013   Procedure: BILATERAL NIPPLE BIOPSY;  Surgeon: Merrie Roof, MD;  Location: [facility];  Service: General;  Laterality: Bilateral;  . EYE SURGERY Bilateral 14   cataracts  . IR RADIOLOGIST EVAL & MGMT  07/26/2017  . LAPAROSCOPIC RIGHT COLECTOMY N/A 09/04/2018   Procedure: LAPAROSCOPIC ASSISTED SIGMOID COLECTOMY WITH REPAIR OF FISTULA TO BLADDER;  Surgeon: Jovita Kussmaul, MD;  Location: [facility];  Service: General;  Laterality: N/A;  . SPLIT NIGHT STUDY  02/02/2016        Short Social History:  Social History   Tobacco Use  . Smoking status: Never Smoker  . Smokeless tobacco: Never Used  Substance Use Topics  . Alcohol use: No    Allergies  Allergen Reactions  . Contrast Media [Iodinated Diagnostic Agents] Hives and Other (See Comments)    Per pt strong burning sensation starting in chest radiating outward   . Clonidine Derivatives Other (See Comments)    Throat dry  . Statins Other (See Comments)    "bones hurt"  . Sulfa  Antibiotics Diarrhea    Tremors   . Celebrex [Celecoxib] Rash  . Isosorbide Nitrate Itching and Rash  . Other Itching and Rash    Plastic and paper tape and heart monitor pads  . Tape Itching and Rash    Red Where applied and will spread    Current Outpatient Medications  Medication Sig Dispense Refill  . acetaminophen (TYLENOL) 500 MG tablet Take 500-1,000 mg by mouth every 6 (six) hours as needed for headache (pain).    Marland Kitchen amLODipine (NORVASC) 5 MG tablet TAKE 1 & 1/2 (ONE & ONE-HALF) TABLETS BY MOUTH ONCE DAILY (Patient taking differently: 7.5 mg daily. ) 135 tablet 2  . clonazePAM (KLONOPIN) 0.5 MG tablet Take 1 tablet by mouth twice daily as needed for anxiety (Patient taking differently: Take 0.5 mg by mouth at bedtime. ) 60 tablet 3  . cloNIDine (CATAPRES) 0.1 MG tablet Take 1 tablet by mouth twice daily (Patient taking differently: Take 0.1 mg by mouth 2 (two) times daily. ) 180 tablet 3  . clotrimazole-betamethasone (LOTRISONE) cream Apply 1 application topically 2 (two) times daily. (Patient taking differently: Apply 1 application topically daily as needed (rash). ) 30 g 0  . ELIQUIS 5 MG TABS tablet Take 1 tablet by mouth twice daily (Patient taking differently: Take 5 mg by mouth 2 (two) times daily. ) 180 tablet 1  . famotidine (PEPCID) 20 MG tablet One at bedtime (Patient taking differently: Take 20 mg by mouth daily as needed for heartburn or indigestion. ) 30 tablet 11  . losartan (COZAAR) 100 MG tablet Take 1 tablet by mouth once daily (Patient taking differently: Take 100 mg by mouth daily. ) 90 tablet 3  . nitroGLYCERIN (NITROSTAT) 0.4 MG SL tablet DISSOLVE ONE TABLET UNDER THE TONGUE EVERY 5 MINUTES AS NEEDED FOR CHEST PAIN.  DO NOT EXCEED A TOTAL OF 3 DOSES IN 15 MINUTES (Patient taking differently: Place 0.4 mg under the tongue every 5 (five) minutes as needed for chest pain. ) 25 tablet 0  . torsemide (DEMADEX) 20 MG tablet Take 3 tablets by mouth once daily (Patient taking  differently: Take 40 mg by mouth daily. ) 90 tablet 10   No current facility-administered medications for this visit.    Review of Systems  Constitutional: Negative for chills, fatigue, fever and unexpected weight change.  HENT: Negative for trouble swallowing.  Eyes: Negative for loss of vision.  Respiratory: Negative for cough, shortness of breath and wheezing.  Cardiovascular: Negative for chest pain, leg swelling, palpitations and syncope.  GI:  Negative for abdominal pain, blood in stool, diarrhea, nausea and vomiting.  GU: Negative for difficulty urinating, dysuria, frequency and hematuria.  Musculoskeletal: Negative for back pain, leg pain and joint pain.  Skin: Negative for rash.  Neurological: Negative for dizziness, headaches, light-headedness, numbness and seizures.  Psychiatric: Negative for behavioral problem, confusion, depressed mood and sleep disturbance.        Objective:  Objective   Vitals:   05/20/20 1443  BP: 128/74  Pulse: (!) 50  Resp: 18  SpO2: 95%  Weight: 188 lb 3.2 oz (85.4 kg)  Height: 5\' 6"  (1.676 m)   Body mass index is 30.38 kg/m.  Physical Exam Vitals and nursing note reviewed.  Constitutional:      Appearance: She is well-developed.  HENT:     Head: Normocephalic and atraumatic.  Eyes:     Pupils: Pupils are equal, round, and reactive to light.  Cardiovascular:     Rate and Rhythm: Normal rate and regular rhythm.  Pulmonary:     Effort: Pulmonary effort is normal. No respiratory distress.  Skin:    General: Skin is warm and dry.  Neurological:     Mental Status: She is alert and oriented to person, place, and time.  Psychiatric:        Behavior: Behavior normal.        Thought Content: Thought content normal.        Judgment: Judgment normal.        Assessment/Plan:     85 yo with postmenopausal bleeding and irregular thickened endometrium suspicious for endometrial polyp.  Planned hysteroscopy D&C, polypectomy scheduled  for 05/29/2020 Patient with stop eliquis 1 day before procedure.   Discussed risks, benefits, and alternatives with the patient for the hysteroscopy.  Discussed the risk of infection bleeding and damage to surrounding pelvic tissues including but not limited to the uterus, fallopian tubes, ovaries, bowel, and bladder.  Discussed possibility of an emergent blood transfusion for heavy blood loss.  Discussed risks associated with blood transfusion including transfusion reaction and chronic infections, or sepsis which could lead to death.  Patient gave consent for the procedure and blood transfusion if needed. Consent forms were completed.  More than 25 minutes were spent face to face with the patient in the room, reviewing the medical record, labs and images, and coordinating care for the patient. The plan of management was discussed in detail and counseling was provided.     Adrian Prows MD [facility] 05/20/2020 3:08 PM

## 2020-05-20 NOTE — Telephone Encounter (Signed)
   Primary Cardiologist: Fransico Him, MD  Chart reviewed as part of pre-operative protocol coverage. Patient was contacted 05/20/2020 in reference to pre-operative risk assessment for pending surgery as outlined below.  [name] was last seen on 04/17/2020 by Dr. [name].  Since that day, [name] has done well without chest pain or shortness of breath. Last nuclear stress test obtained on 07/24/2019 was low risk.   Therefore, based on ACC/AHA guidelines, the patient would be at acceptable risk for the planned procedure without further cardiovascular testing.   The patient was advised that if she develops new symptoms prior to surgery to contact our office to arrange for a follow-up visit, and she verbalized understanding.  I will route this recommendation to the requesting party via Epic fax function and remove from pre-op pool. Please call with questions.  [place], [place] 05/20/2020, 5:54 PM

## 2020-05-20 NOTE — H&P (View-Only) (Signed)
Patient ID: [name], female   DOB: 09-27-1934, 85 y.o.   MRN: [id number]  Reason for Consult: Pre-op Exam (H&P )   Referred by [name], MD  Subjective:     HPI:  [name] is a 85 y.o. female . She has a planned hysteroscopy for irregular thickened endometrium and postmenopausal bleeding planned for 05/29/2020, suspected endometrial polyp. She reports no further bleeding since her last visit.    Past Medical History:  Diagnosis Date  . Allergy    rhinitis  . Aortic stenosis    mild by echo 06/2017  . Arthritis   . Bradycardia    a. 10/2017 -> beta blocker cut back due to HR 39.  . Breast cancer ([place]) 01/06/2012  . Cancer [facility])    right colon and left breast  . Chronic diastolic CHF (congestive heart failure) ([place])   . Colon cancer ([place]) 01/06/2012  . Colovesical fistula    Dr. Marlou Starks and [name] planning surgery 08/2018- surgery revealed spontaneous closure  . COPD (chronic obstructive pulmonary disease) ([place])    pt. denies  . Coronary artery disease 2006   a.  NSTEMI in 2016, cath showed 15% prox-mid RCA, 20% prox LAD, EF 25-35% by cath and 35-40% -> felt due to Takotsubo cardiomyopathy.  . Dilated aortic root ([place])    20mmHg by echo 06/2017  . Diverticulosis   . Dyspnea   . Edema extremities   . GERD (gastroesophageal reflux disease)   . Hernia   . Hiatal hernia    denies  . Hyperlipidemia   . Hypertension   . Mild aortic stenosis    echo 11/2015 but not noted on echo 06/2016  . Osteopenia   . Permanent atrial fibrillation (HCC)    chronic atrial fibrillation  . Pneumonia    hx child  . Pulmonary HTN ([place])    a. moderate to severe PASP 51mmHg echo 11/2015 - now 90mmHg by echo 06/2017. CTA chest in 11/16 with no PE. PFTs in 7/15 with mild obstructive lung disease. She had a negative sleep study in 2017. b. Felt due to left sided HF.  Marland Kitchen Stroke ([place])   . Takotsubo syndrome 07/29/2015   a. EF 35-40% by echo; akinesis of mid-apical anteroseptal and apical  myocardium.  EF now normalized on echo 11/2015   Family History  Problem Relation Age of Onset  . Heart disease Mother   . Heart attack Mother   . Cancer Sister        stomach and colon  . Heart disease Brother 76  . Hypertension Father   . Cancer Sister   . Stroke Neg Hx    Past Surgical History:  Procedure Laterality Date  . APPENDECTOMY    . BREAST SURGERY     lumpectomy left  . CARDIAC CATHETERIZATION    . CARDIAC CATHETERIZATION N/A 07/28/2015   Procedure: Left Heart Cath and Coronary Angiography;  Surgeon: Peter M Martinique, MD;  Location: [facility];  Service: Cardiovascular;  Laterality: N/A;  . CHOLECYSTECTOMY    . COLECTOMY     right side  . CYSTOSCOPY WITH STENT PLACEMENT Left 09/04/2018   Procedure: CYSTOSCOPY WITH LEFT STENT PLACEMENT, BLADDER REPAIR, CYSTOSCOPY WITH LEFT STENT REMOVAL;  Surgeon: Raynelle Bring, MD;  Location: [facility];  Service: Urology;  Laterality: Left;  . EXCISION OF ACCESSORY NIPPLE Bilateral 05/30/2013   Procedure: BILATERAL NIPPLE BIOPSY;  Surgeon: Merrie Roof, MD;  Location: [facility];  Service: General;  Laterality: Bilateral;  . EYE SURGERY Bilateral 14   cataracts  . IR RADIOLOGIST EVAL & MGMT  07/26/2017  . LAPAROSCOPIC RIGHT COLECTOMY N/A 09/04/2018   Procedure: LAPAROSCOPIC ASSISTED SIGMOID COLECTOMY WITH REPAIR OF FISTULA TO BLADDER;  Surgeon: Jovita Kussmaul, MD;  Location: [facility];  Service: General;  Laterality: N/A;  . SPLIT NIGHT STUDY  02/02/2016        Short Social History:  Social History   Tobacco Use  . Smoking status: Never Smoker  . Smokeless tobacco: Never Used  Substance Use Topics  . Alcohol use: No    Allergies  Allergen Reactions  . Contrast Media [Iodinated Diagnostic Agents] Hives and Other (See Comments)    Per pt strong burning sensation starting in chest radiating outward   . Clonidine Derivatives Other (See Comments)    Throat dry  . Statins Other (See Comments)    "bones hurt"  . Sulfa  Antibiotics Diarrhea    Tremors   . Celebrex [Celecoxib] Rash  . Isosorbide Nitrate Itching and Rash  . Other Itching and Rash    Plastic and paper tape and heart monitor pads  . Tape Itching and Rash    Red Where applied and will spread    Current Outpatient Medications  Medication Sig Dispense Refill  . acetaminophen (TYLENOL) 500 MG tablet Take 500-1,000 mg by mouth every 6 (six) hours as needed for headache (pain).    Marland Kitchen amLODipine (NORVASC) 5 MG tablet TAKE 1 & 1/2 (ONE & ONE-HALF) TABLETS BY MOUTH ONCE DAILY (Patient taking differently: 7.5 mg daily. ) 135 tablet 2  . clonazePAM (KLONOPIN) 0.5 MG tablet Take 1 tablet by mouth twice daily as needed for anxiety (Patient taking differently: Take 0.5 mg by mouth at bedtime. ) 60 tablet 3  . cloNIDine (CATAPRES) 0.1 MG tablet Take 1 tablet by mouth twice daily (Patient taking differently: Take 0.1 mg by mouth 2 (two) times daily. ) 180 tablet 3  . clotrimazole-betamethasone (LOTRISONE) cream Apply 1 application topically 2 (two) times daily. (Patient taking differently: Apply 1 application topically daily as needed (rash). ) 30 g 0  . ELIQUIS 5 MG TABS tablet Take 1 tablet by mouth twice daily (Patient taking differently: Take 5 mg by mouth 2 (two) times daily. ) 180 tablet 1  . famotidine (PEPCID) 20 MG tablet One at bedtime (Patient taking differently: Take 20 mg by mouth daily as needed for heartburn or indigestion. ) 30 tablet 11  . losartan (COZAAR) 100 MG tablet Take 1 tablet by mouth once daily (Patient taking differently: Take 100 mg by mouth daily. ) 90 tablet 3  . nitroGLYCERIN (NITROSTAT) 0.4 MG SL tablet DISSOLVE ONE TABLET UNDER THE TONGUE EVERY 5 MINUTES AS NEEDED FOR CHEST PAIN.  DO NOT EXCEED A TOTAL OF 3 DOSES IN 15 MINUTES (Patient taking differently: Place 0.4 mg under the tongue every 5 (five) minutes as needed for chest pain. ) 25 tablet 0  . torsemide (DEMADEX) 20 MG tablet Take 3 tablets by mouth once daily (Patient taking  differently: Take 40 mg by mouth daily. ) 90 tablet 10   No current facility-administered medications for this visit.    Review of Systems  Constitutional: Negative for chills, fatigue, fever and unexpected weight change.  HENT: Negative for trouble swallowing.  Eyes: Negative for loss of vision.  Respiratory: Negative for cough, shortness of breath and wheezing.  Cardiovascular: Negative for chest pain, leg swelling, palpitations and syncope.  GI:  Negative for abdominal pain, blood in stool, diarrhea, nausea and vomiting.  GU: Negative for difficulty urinating, dysuria, frequency and hematuria.  Musculoskeletal: Negative for back pain, leg pain and joint pain.  Skin: Negative for rash.  Neurological: Negative for dizziness, headaches, light-headedness, numbness and seizures.  Psychiatric: Negative for behavioral problem, confusion, depressed mood and sleep disturbance.        Objective:  Objective   Vitals:   05/20/20 1443  BP: 128/74  Pulse: (!) 50  Resp: 18  SpO2: 95%  Weight: 188 lb 3.2 oz (85.4 kg)  Height: 5\' 6"  (1.676 m)   Body mass index is 30.38 kg/m.  Physical Exam Vitals and nursing note reviewed.  Constitutional:      Appearance: She is well-developed.  HENT:     Head: Normocephalic and atraumatic.  Eyes:     Pupils: Pupils are equal, round, and reactive to light.  Cardiovascular:     Rate and Rhythm: Normal rate and regular rhythm.  Pulmonary:     Effort: Pulmonary effort is normal. No respiratory distress.  Skin:    General: Skin is warm and dry.  Neurological:     Mental Status: She is alert and oriented to person, place, and time.  Psychiatric:        Behavior: Behavior normal.        Thought Content: Thought content normal.        Judgment: Judgment normal.        Assessment/Plan:     85 yo with postmenopausal bleeding and irregular thickened endometrium suspicious for endometrial polyp.  Planned hysteroscopy D&C, polypectomy scheduled  for 05/29/2020 Patient with stop eliquis 1 day before procedure.   Discussed risks, benefits, and alternatives with the patient for the hysteroscopy.  Discussed the risk of infection bleeding and damage to surrounding pelvic tissues including but not limited to the uterus, fallopian tubes, ovaries, bowel, and bladder.  Discussed possibility of an emergent blood transfusion for heavy blood loss.  Discussed risks associated with blood transfusion including transfusion reaction and chronic infections, or sepsis which could lead to death.  Patient gave consent for the procedure and blood transfusion if needed. Consent forms were completed.  More than 25 minutes were spent face to face with the patient in the room, reviewing the medical record, labs and images, and coordinating care for the patient. The plan of management was discussed in detail and counseling was provided.     Adrian Prows MD [facility], [facility] 05/20/2020 3:08 PM

## 2020-05-21 ENCOUNTER — Inpatient Hospital Stay: Admission: RE | Admit: 2020-05-21 | Payer: Medicare Other | Source: Ambulatory Visit

## 2020-05-21 ENCOUNTER — Other Ambulatory Visit
Admission: RE | Admit: 2020-05-21 | Discharge: 2020-05-21 | Disposition: A | Discharge status: Home / Self Care | Payer: Medicare Other | Source: Ambulatory Visit | Attending: Obstetrics and Gynecology | Admitting: Obstetrics and Gynecology

## 2020-05-21 NOTE — Patient Instructions (Signed)
Your procedure is scheduled on: May 29, 2020 Thursday  Report to Day Surgery on the 2nd floor of the [facility]. To find out your arrival time, please call [phone] between 1PM - 3PM on: May 28, 2020 Wednesday   REMEMBER: Instructions that are not followed completely Re[place]man result in serious medical risk, up to and including death; or upon the discretion of your surgeon and anesthesiologist your surgery Isley need to be rescheduled.  Do not eat food after midnight the night before surgery.  No gum chewing, lozengers or hard candies.  You Dumas however, drink CLEAR liquids up to 2 hours before you are scheduled to arrive for your surgery. Do not drink anything within 2 hours of your scheduled arrival time.  Clear liquids include: - water  - apple juice without pulp - gatorade (not RED) - black coffee or tea (Do NOT add milk or creamers to the coffee or tea) Do NOT drink anything that is not on this list.  Type 1 and Type 2 diabetics should only drink water.  TAKE THESE MEDICATIONS THE MORNING OF SURGERY WITH A SIP OF WATER: AMLODIPINE CLONIDINE FAMOTIDINE (take one the night before and one on the morning of surgery - helps to prevent nausea after surgery.)   NO ELIQUIS 05-28-2020   One week prior to surgery: Stop Anti-inflammatories (NSAIDS) such as Advil, Aleve, Ibuprofen, Motrin, Naproxen, Naprosyn and Aspirin based products such as Excedrin, Goodys Powder, BC Powder. Stop ANY OVER THE COUNTER supplements until after surgery. (You Rossel continue taking Tylenol, Vitamin D, Vitamin B, and multivitamin.)  No Alcohol for 24 hours before or after surgery.  No Smoking including e-cigarettes for 24 hours prior to surgery.  No chewable tobacco products for at least 6 hours prior to surgery.  No nicotine patches on the day of surgery.  Do not use any "recreational" drugs for at least a week prior to your surgery.  Please be advised that the combination of cocaine and  anesthesia [place] have negative outcomes, up to and including death. If you test positive for cocaine, your surgery will be cancelled.  On the morning of surgery brush your teeth with toothpaste and water, you Jumper rinse your mouth with mouthwash if you wish. Do not swallow any toothpaste or mouthwash.  Do not wear jewelry, make-up, hairpins, clips or nail polish.  Do not wear lotions, powders, or perfumes.   Do not shave 48 hours prior to surgery.   Contact lenses, hearing aids and dentures [place] not be worn into surgery.  Do not bring valuables to the hospital. [facility] is not responsible for any missing/lost belongings or valuables.   SHOWER MORNING OF SURGERY  Notify your doctor if there is any change in your medical condition (cold, fever, infection).  Wear comfortable clothing (specific to your surgery type) to the hospital.  Plan for stool softeners for home use; pain medications have a tendency to cause constipation. You can also help prevent constipation by eating foods high in fiber such as fruits and vegetables and drinking plenty of fluids as your diet allows.  After surgery, you can help prevent lung complications by doing breathing exercises.  Take deep breaths and cough every 1-2 hours. Your doctor Sorey order a device called an Incentive Spirometer to help you take deep breaths. When coughing or sneezing, hold a pillow firmly against your incision with both hands. This is called splinting. Doing this helps protect your incision. It also decreases belly discomfort.  If you  are being discharged the day of surgery, you will not be allowed to drive home. You will need a responsible adult (18 years or older) to drive you home and stay with you that night.   If you are taking public transportation, you will need to have a responsible adult (18 years or older) with you. Please confirm with your physician that it is acceptable to use public transportation.   Please call the  [facility] at [phone] if you have any questions about these instructions.  Visitation Policy:  Patients undergoing a surgery or procedure [place] have one family member or support person with them as long as that person is not COVID-19 positive or experiencing its symptoms.  That person [place] remain in the waiting area during the procedure.  Inpatient Visitation Update:   In an effort to ensure the safety of our team members and our patients, we are implementing a change to our visitation policy:  Effective Monday, Aug. 9, at 7 a.m., inpatients will be allowed one support person.  o The support person Engebretson change daily.  o The support person must pass our screening, gel in and out, and wear a mask at all times, including in the patients room.  o Patients must also wear a mask when staff or their support person are in the room.  o Masking is required regardless of vaccination status.  Systemwide, no visitors 17 or younger.

## 2020-05-23 ENCOUNTER — Ambulatory Visit
Admission: RE | Admit: 2020-05-23 | Discharge: 2020-05-23 | Disposition: A | Discharge status: Home / Self Care | Payer: Medicare Other | Source: Ambulatory Visit | Attending: Family Medicine | Admitting: Family Medicine

## 2020-05-23 ENCOUNTER — Other Ambulatory Visit: Payer: Self-pay

## 2020-05-23 ENCOUNTER — Ambulatory Visit (INDEPENDENT_AMBULATORY_CARE_PROVIDER_SITE_OTHER): Payer: Medicare Other | Admitting: Family Medicine

## 2020-05-23 VITALS — BP 130/56 | HR 64 | Temp 97.7°F | Resp 18 | Wt 188.0 lb

## 2020-05-23 DIAGNOSIS — I4821 Permanent atrial fibrillation: Secondary | ICD-10-CM

## 2020-05-23 DIAGNOSIS — R0602 Shortness of breath: Secondary | ICD-10-CM

## 2020-05-23 DIAGNOSIS — I10 Essential (primary) hypertension: Secondary | ICD-10-CM

## 2020-05-23 DIAGNOSIS — I5181 Takotsubo syndrome: Secondary | ICD-10-CM | POA: Diagnosis not present

## 2020-05-23 NOTE — Progress Notes (Signed)
Subjective:    Patient ID: [name], female    DOB: 1935-02-26, 85 y.o.   MRN: [id number]  7/21 In 2016, the patient was diagnosed with Takotsubo cardiomyopathy with an ejection fraction of 35 to 40%.  For that reason she is on spironolactone coupled with losartan as well as torsemide.  Previously she was taking 60 mg of torsemide daily.  She has since reduced to 40 mg a day.  She saw my partner recently and was diagnosed with hyponatremia and hyperkalemia.  Sodium levels were around 130 and potassium was 5.5.  She was given Kayexalate x1 and told to follow-up with me today.  She denies any chest pain shortness of breath dyspnea on exertion.  At that time, my plan was: I believe her hyponatremia and hyperkalemia is due to aldosterone suppression due to the combination of losartan and spironolactone.  Fortunately this is improved her ejection fraction from 35% to 65% on an echocardiogram in December of last year.  However if her potassium remains elevated, I would recommend discontinuation of spironolactone and we [place] even need to reduce the dose of losartan.  I will await the results of the BMP before making any further decisions.  Patient is completely asymptomatic.  05/23/20 Since I last saw the patient, spironolactone has been discontinued.  She states that over the last week she has developed some discomfort in her lower chest just above the xiphoid process.  She attributes it to allergies and postnasal drip.  The pain is constant at a 2 on a scale of 1-10.  There is no exacerbating or alleviating factors.  She denies any cough.  However she does report shortness of breath with activity.  She states that she is becoming more easily winded.  This seemed to occur after decreasing her torsemide from 60-40 and stopping spironolactone.  She denies any orthopnea or paroxysmal nocturnal dyspnea. However when I saw the patient in July she weighed 179 pounds.  Today she is 188 pounds.  She has trace pitting  edema in both legs up to the knee.  Her lungs are relatively clear to auscultation bilaterally.  She had a stress test performed in November of last year that showed a normal ejection fraction of 70% and a fixed defect that was attributed to breast attenuation.  It was felt to be low risk for ischemia.  I can reproduce the patient's chest pain today by palpation on the costochondral margin.  She winces and is sore when I press on her breastbone.  This seems to reproduce the pain exactly making me feel that this is more likely muscular however I believe the shortness of breath could be due to fluid retention. Past Medical History:  Diagnosis Date  . Allergy    rhinitis  . Aortic stenosis    mild by echo 06/2017  . Arthritis   . Bradycardia    a. 10/2017 -> beta blocker cut back due to HR 39.  . Breast cancer ([place]) 01/06/2012  . Cancer [facility])    right colon and left breast  . Chronic diastolic CHF (congestive heart failure) ([place])   . Colon cancer ([place]) 01/06/2012  . Colovesical fistula    Dr. Marlou Starks and [name] planning surgery 08/2018- surgery revealed spontaneous closure  . COPD (chronic obstructive pulmonary disease) ([place])    pt. denies  . Coronary artery disease 2006   a.  NSTEMI in 2016, cath showed 15% prox-mid RCA, 20% prox LAD, EF 25-35% by cath and  35-40% -> felt due to Takotsubo cardiomyopathy.  . Dilated aortic root ([place])    28mmHg by echo 06/2017  . Diverticulosis   . Dyspnea   . Edema extremities   . GERD (gastroesophageal reflux disease)   . Hernia   . Hiatal hernia    denies  . Hyperlipidemia   . Hypertension   . Mild aortic stenosis    echo 11/2015 but not noted on echo 06/2016  . Osteopenia   . Permanent atrial fibrillation (HCC)    chronic atrial fibrillation  . Pneumonia    hx child  . Pulmonary HTN ([place])    a. moderate to severe PASP 72mmHg echo 11/2015 - now 87mmHg by echo 06/2017. CTA chest in 11/16 with no PE. PFTs in 7/15 with mild obstructive lung disease. She  had a negative sleep study in 2017. b. Felt due to left sided HF.  Marland Kitchen Stroke ([place])   . Takotsubo syndrome 07/29/2015   a. EF 35-40% by echo; akinesis of mid-apical anteroseptal and apical myocardium.  EF now normalized on echo 11/2015   Past Surgical History:  Procedure Laterality Date  . APPENDECTOMY    . BREAST SURGERY     lumpectomy left  . CARDIAC CATHETERIZATION    . CARDIAC CATHETERIZATION N/A 07/28/2015   Procedure: Left Heart Cath and Coronary Angiography;  Surgeon: Peter M Martinique, MD;  Location: [facility];  Service: Cardiovascular;  Laterality: N/A;  . CHOLECYSTECTOMY    . COLECTOMY     right side  . CYSTOSCOPY WITH STENT PLACEMENT Left 09/04/2018   Procedure: CYSTOSCOPY WITH LEFT STENT PLACEMENT, BLADDER REPAIR, CYSTOSCOPY WITH LEFT STENT REMOVAL;  Surgeon: Raynelle Bring, MD;  Location: [facility];  Service: Urology;  Laterality: Left;  . EXCISION OF ACCESSORY NIPPLE Bilateral 05/30/2013   Procedure: BILATERAL NIPPLE BIOPSY;  Surgeon: Merrie Roof, MD;  Location: [facility];  Service: General;  Laterality: Bilateral;  . EYE SURGERY Bilateral 12   cataracts  . IR RADIOLOGIST EVAL & MGMT  07/26/2017  . LAPAROSCOPIC RIGHT COLECTOMY N/A 09/04/2018   Procedure: LAPAROSCOPIC ASSISTED SIGMOID COLECTOMY WITH REPAIR OF FISTULA TO BLADDER;  Surgeon: Jovita Kussmaul, MD;  Location: [facility];  Service: General;  Laterality: N/A;  . SPLIT NIGHT STUDY  02/02/2016       Current Outpatient Medications on File Prior to Visit  Medication Sig Dispense Refill  . acetaminophen (TYLENOL) 500 MG tablet Take 500-1,000 mg by mouth every 6 (six) hours as needed for headache (pain).    Marland Kitchen amLODipine (NORVASC) 5 MG tablet TAKE 1 & 1/2 (ONE & ONE-HALF) TABLETS BY MOUTH ONCE DAILY (Patient taking differently: 7.5 mg daily. ) 135 tablet 2  . clonazePAM (KLONOPIN) 0.5 MG tablet Take 1 tablet by mouth twice daily as needed for anxiety (Patient taking differently: Take 0.5 mg by mouth at bedtime. ) 60 tablet 3    . cloNIDine (CATAPRES) 0.1 MG tablet Take 1 tablet by mouth twice daily (Patient taking differently: Take 0.1 mg by mouth 2 (two) times daily. ) 180 tablet 3  . clotrimazole-betamethasone (LOTRISONE) cream Apply 1 application topically 2 (two) times daily. (Patient taking differently: Apply 1 application topically daily as needed (rash). ) 30 g 0  . ELIQUIS 5 MG TABS tablet Take 1 tablet by mouth twice daily (Patient taking differently: Take 5 mg by mouth 2 (two) times daily. ) 180 tablet 1  . famotidine (PEPCID) 20 MG tablet One at bedtime (Patient taking differently: Take 20 mg  by mouth daily as needed for heartburn or indigestion. ) 30 tablet 11  . losartan (COZAAR) 100 MG tablet Take 1 tablet by mouth once daily (Patient taking differently: Take 100 mg by mouth daily. ) 90 tablet 3  . nitroGLYCERIN (NITROSTAT) 0.4 MG SL tablet DISSOLVE ONE TABLET UNDER THE TONGUE EVERY 5 MINUTES AS NEEDED FOR CHEST PAIN.  DO NOT EXCEED A TOTAL OF 3 DOSES IN 15 MINUTES (Patient taking differently: Place 0.4 mg under the tongue every 5 (five) minutes as needed for chest pain. ) 25 tablet 0  . torsemide (DEMADEX) 20 MG tablet Take 3 tablets by mouth once daily (Patient taking differently: Take 40 mg by mouth daily. ) 90 tablet 10   No current facility-administered medications on file prior to visit.   Allergies  Allergen Reactions  . Contrast Media [Iodinated Diagnostic Agents] Hives and Other (See Comments)    Per pt strong burning sensation starting in chest radiating outward   . Clonidine Derivatives Other (See Comments)    Throat dry  . Statins Other (See Comments)    "bones hurt"  . Sulfa Antibiotics Diarrhea    Tremors   . Celebrex [Celecoxib] Rash  . Isosorbide Nitrate Itching and Rash  . Other Itching and Rash    Plastic and paper tape and heart monitor pads  . Tape Itching and Rash    Red Where applied and will spread   Social History   Socioeconomic History  . Marital status: Married     Spouse name: Not on file  . Number of children: Not on file  . Years of education: Not on file  . Highest education level: Not on file  Occupational History  . Not on file  Tobacco Use  . Smoking status: Never Smoker  . Smokeless tobacco: Never Used  Vaping Use  . Vaping Use: Never used  Substance and Sexual Activity  . Alcohol use: No  . Drug use: No  . Sexual activity: Not Currently  Other Topics Concern  . Not on file  Social History Narrative  . Not on file   Social Determinants of Health   Financial Resource Strain:   . Difficulty of Paying Living Expenses: Not on file  Food Insecurity:   . Worried About Charity fundraiser in the Last Year: Not on file  . Ran Out of Food in the Last Year: Not on file  Transportation Needs:   . Lack of Transportation (Medical): Not on file  . Lack of Transportation (Non-Medical): Not on file  Physical Activity:   . Days of Exercise per Week: Not on file  . Minutes of Exercise per Session: Not on file  Stress:   . Feeling of Stress : Not on file  Social Connections:   . Frequency of Communication with Friends and Family: Not on file  . Frequency of Social Gatherings with Friends and Family: Not on file  . Attends Religious Services: Not on file  . Active Member of Clubs or Organizations: Not on file  . Attends Archivist Meetings: Not on file  . Marital Status: Not on file  Intimate Partner Violence:   . Fear of Current or Ex-Partner: Not on file  . Emotionally Abused: Not on file  . Physically Abused: Not on file  . Sexually Abused: Not on file      Review of Systems  Musculoskeletal: Positive for back pain.  All other systems reviewed and are negative.  Objective:   Physical Exam Vitals reviewed.  Constitutional:      General: She is not in acute distress.    Appearance: Normal appearance. She is well-developed. She is not diaphoretic.  HENT:     Right Ear: External ear normal.     Left Ear:  External ear normal.     Nose: Rhinorrhea present. No mucosal edema.     Mouth/Throat:     Pharynx: No oropharyngeal exudate.  Eyes:     Conjunctiva/sclera: Conjunctivae normal.  Neck:     Thyroid: No thyroid mass.     Vascular: Normal carotid pulses. No carotid bruit or JVD.     Trachea: Trachea and phonation normal.  Cardiovascular:     Rate and Rhythm: Bradycardia present. Rhythm irregularly irregular.     Heart sounds: Normal heart sounds.  Pulmonary:     Effort: Pulmonary effort is normal. No respiratory distress.     Breath sounds: Normal breath sounds. No wheezing or rales.  Chest:     Chest wall: No tenderness.    Abdominal:     General: Bowel sounds are normal. There is no distension.     Palpations: Abdomen is not rigid.     Tenderness: There is no abdominal tenderness. There is no guarding or rebound.  Musculoskeletal:     Cervical back: Normal range of motion and neck supple. No edema or erythema.     Right lower leg: Edema present.     Left lower leg: Edema present.  Lymphadenopathy:     Cervical: No cervical adenopathy.  Skin:    General: Skin is warm.     Findings: No erythema.           Assessment & Plan:  Shortness of breath - Plan: EKG 12-Lead, CBC with Differential/Platelet, COMPLETE METABOLIC PANEL WITH GFR, Brain natriuretic peptide, DG Chest 2 View  Takotsubo syndrome  Permanent atrial fibrillation (HCC)  Benign essential HTN   Given the sudden weight gain, I believe the shortness of breath is likely secondary to fluid retention coupled with her underlying Takotsubo syndrome.  The shortness of breath would coincide with the reduced dose of her diuretics.  She also has increased weight gain and pitting edema in her legs.  I believe the chest pain is most likely due to muscle soreness as I can reproduce the pain exactly with palpation and exercise does not exacerbate the pain.  Furthermore is been present for more than a week and she does not  appear in any acute distress today.  She states that the pain has been constant for a week with no exacerbating or alleviating factors.  It does not seem to be associated with food.  Is not associated with nausea and vomiting.  EKG today shows ST segment depression in lead I and aVL.  However this is a chronic finding it was present on her EKG previously.  There are no acute changes.  There is no signs of ischemia that are new on her EKG..  Therefore I will increase her torsemide to 60 mg a day and have the patient resume her spironolactone 12.5 mg poqday.  Recheck here next week to see if her shortness of breath is improving and also to repeat a potassium level.  Meanwhile obtain a BNP, a chest x-ray, and a CBC along with a CMP.  Seek medical attention immediately over the weekend if worsening

## 2020-05-24 LAB — COMPLETE METABOLIC PANEL WITH GFR
AG Ratio: 1.4 (calc) (ref 1.0–2.5)
ALT: 12 U/L (ref 6–29)
AST: 17 U/L (ref 10–35)
Albumin: 3.9 g/dL (ref 3.6–5.1)
Alkaline phosphatase (APISO): 83 U/L (ref 37–153)
BUN/Creatinine Ratio: 28 (calc) — ABNORMAL HIGH (ref 6–22)
BUN: 27 mg/dL — ABNORMAL HIGH (ref 7–25)
CO2: 23 mmol/L (ref 20–32)
Calcium: 9.3 mg/dL (ref 8.6–10.4)
Chloride: 98 mmol/L (ref 98–110)
Creat: 0.97 mg/dL — ABNORMAL HIGH (ref 0.60–0.88)
GFR, Est African American: 63 mL/min/{1.73_m2} (ref >=60)
GFR, Est Non African American: 54 mL/min/{1.73_m2} — ABNORMAL LOW (ref >=60)
Globulin: 2.8 g/dL (calc) (ref 1.9–3.7)
Glucose, Bld: 102 mg/dL — ABNORMAL HIGH (ref 65–99)
Potassium: 4.9 mmol/L (ref 3.5–5.3)
Sodium: 131 mmol/L — ABNORMAL LOW (ref 135–146)
Total Bilirubin: 0.5 mg/dL (ref 0.2–1.2)
Total Protein: 6.7 g/dL (ref 6.1–8.1)

## 2020-05-24 LAB — CBC WITH DIFFERENTIAL/PLATELET
Absolute Monocytes: 655 cells/uL (ref 200–950)
Basophils Absolute: 20 cells/uL (ref 0–200)
Basophils Relative: 0.4 %
Eosinophils Absolute: 140 cells/uL (ref 15–500)
Eosinophils Relative: 2.8 %
HCT: 31 % — ABNORMAL LOW (ref 35.0–45.0)
Hemoglobin: 10 g/dL — ABNORMAL LOW (ref 11.7–15.5)
Lymphs Abs: 1330 cells/uL (ref 850–3900)
MCH: 29.9 pg (ref 27.0–33.0)
MCHC: 32.3 g/dL (ref 32.0–36.0)
MCV: 92.8 fL (ref 80.0–100.0)
MPV: 9.7 fL (ref 7.5–12.5)
Monocytes Relative: 13.1 %
Neutro Abs: 2855 cells/uL (ref 1500–7800)
Neutrophils Relative %: 57.1 %
Platelets: 219 10*3/uL (ref 140–400)
RBC: 3.34 10*6/uL — ABNORMAL LOW (ref 3.80–5.10)
RDW: 12 % (ref 11.0–15.0)
Total Lymphocyte: 26.6 %
WBC: 5 10*3/uL (ref 3.8–10.8)

## 2020-05-26 ENCOUNTER — Ambulatory Visit (INDEPENDENT_AMBULATORY_CARE_PROVIDER_SITE_OTHER): Payer: Medicare Other | Admitting: Family Medicine

## 2020-05-26 ENCOUNTER — Other Ambulatory Visit: Payer: Self-pay

## 2020-05-26 VITALS — BP 150/42 | HR 67 | Temp 98.5°F | Ht 66.0 in | Wt 187.0 lb

## 2020-05-26 DIAGNOSIS — R0602 Shortness of breath: Secondary | ICD-10-CM | POA: Diagnosis not present

## 2020-05-26 LAB — BRAIN NATRIURETIC PEPTIDE: Brain Natriuretic Peptide: 86 pg/mL (ref <100)

## 2020-05-26 NOTE — Progress Notes (Signed)
Subjective:    Patient ID: [name], female    DOB: September 22, 1934, 85 y.o.   MRN: [id number]  7/21 In 2016, the patient was diagnosed with Takotsubo cardiomyopathy with an ejection fraction of 35 to 40%.  For that reason she is on spironolactone coupled with losartan as well as torsemide.  Previously she was taking 60 mg of torsemide daily.  She has since reduced to 40 mg a day.  She saw my partner recently and was diagnosed with hyponatremia and hyperkalemia.  Sodium levels were around 130 and potassium was 5.5.  She was given Kayexalate x1 and told to follow-up with me today.  She denies any chest pain shortness of breath dyspnea on exertion.  At that time, my plan was: I believe her hyponatremia and hyperkalemia is due to aldosterone suppression due to the combination of losartan and spironolactone.  Fortunately this is improved her ejection fraction from 35% to 65% on an echocardiogram in December of last year.  However if her potassium remains elevated, I would recommend discontinuation of spironolactone and we [place] even need to reduce the dose of losartan.  I will await the results of the BMP before making any further decisions.  Patient is completely asymptomatic.  05/23/20 Since I last saw the patient, spironolactone has been discontinued.  She states that over the last week she has developed some discomfort in her lower chest just above the xiphoid process.  She attributes it to allergies and postnasal drip.  The pain is constant at a 2 on a scale of 1-10.  There is no exacerbating or alleviating factors.  She denies any cough.  However she does report shortness of breath with activity.  She states that she is becoming more easily winded.  This seemed to occur after decreasing her torsemide from 60-40 and stopping spironolactone.  She denies any orthopnea or paroxysmal nocturnal dyspnea. However when I saw the patient in July she weighed 179 pounds.  Today she is 188 pounds.  She has trace pitting  edema in both legs up to the knee.  Her lungs are relatively clear to auscultation bilaterally.  She had a stress test performed in November of last year that showed a normal ejection fraction of 70% and a fixed defect that was attributed to breast attenuation.  It was felt to be low risk for ischemia.  I can reproduce the patient's chest pain today by palpation on the costochondral margin.  She winces and is sore when I press on her breastbone.  This seems to reproduce the pain exactly making me feel that this is more likely muscular however I believe the shortness of breath could be due to fluid retention.  At that time, my plan was: Given the sudden weight gain, I believe the shortness of breath is likely secondary to fluid retention coupled with her underlying Takotsubo syndrome.  The shortness of breath would coincide with the reduced dose of her diuretics.  She also has increased weight gain and pitting edema in her legs.  I believe the chest pain is most likely due to muscle soreness as I can reproduce the pain exactly with palpation and exercise does not exacerbate the pain.  Furthermore is been present for more than a week and she does not appear in any acute distress today.  She states that the pain has been constant for a week with no exacerbating or alleviating factors.  It does not seem to be associated with food.  Is not  associated with nausea and vomiting.  EKG today shows ST segment depression in lead I and aVL.  However this is a chronic finding it was present on her EKG previously.  There are no acute changes.  There is no signs of ischemia that are new on her EKG..  Therefore I will increase her torsemide to 60 mg a day and have the patient resume her spironolactone 12.5 mg poqday.  Recheck here next week to see if her shortness of breath is improving and also to repeat a potassium level.  Meanwhile obtain a BNP, a chest x-ray, and a CBC along with a CMP.  Seek medical attention immediately over  the weekend if worsening  05/26/20 Patient's weight is down 1 pound.  She started the spironolactone Sunday.  She is taken 2 doses.  She increased her torsemide to 60 mg a day on Friday.  She states that her breathing feels much better despite only having 1 pound less than weight.  She denies any further chest pain.  She still feels somewhat short of breath with activity however and her weight is still elevated above her baseline Past Medical History:  Diagnosis Date  . Allergy    rhinitis  . Aortic stenosis    mild by echo 06/2017  . Arthritis   . Bradycardia    a. 10/2017 -> beta blocker cut back due to HR 39.  . Breast cancer ([place]) 01/06/2012  . Cancer [facility])    right colon and left breast  . Chronic diastolic CHF (congestive heart failure) ([place])   . Colon cancer ([place]) 01/06/2012  . Colovesical fistula    Dr. Marlou Starks and [name] planning surgery 08/2018- surgery revealed spontaneous closure  . COPD (chronic obstructive pulmonary disease) ([place])    pt. denies  . Coronary artery disease 2006   a.  NSTEMI in 2016, cath showed 15% prox-mid RCA, 20% prox LAD, EF 25-35% by cath and 35-40% -> felt due to Takotsubo cardiomyopathy.  . Dilated aortic root ([place])    63mmHg by echo 06/2017  . Diverticulosis   . Dyspnea   . Edema extremities   . GERD (gastroesophageal reflux disease)   . Hernia   . Hiatal hernia    denies  . Hyperlipidemia   . Hypertension   . Mild aortic stenosis    echo 11/2015 but not noted on echo 06/2016  . Osteopenia   . Permanent atrial fibrillation ([facility])    chronic atrial fibrillation  . Pneumonia    hx child  . Pulmonary HTN ([place])    a. moderate to severe PASP 68mmHg echo 11/2015 - now 74mmHg by echo 06/2017. CTA chest in 11/16 with no PE. PFTs in 7/15 with mild obstructive lung disease. She had a negative sleep study in 2017. b. Felt due to left sided HF.  Marland Kitchen Stroke ([place])   . Takotsubo syndrome 07/29/2015   a. EF 35-40% by echo; akinesis of mid-apical anteroseptal  and apical myocardium.  EF now normalized on echo 11/2015   Past Surgical History:  Procedure Laterality Date  . APPENDECTOMY    . BREAST SURGERY     lumpectomy left  . CARDIAC CATHETERIZATION    . CARDIAC CATHETERIZATION N/A 07/28/2015   Procedure: Left Heart Cath and Coronary Angiography;  Surgeon: Peter M Martinique, MD;  Location: [facility];  Service: Cardiovascular;  Laterality: N/A;  . CHOLECYSTECTOMY    . COLECTOMY     right side  . CYSTOSCOPY WITH STENT PLACEMENT Left  09/04/2018   Procedure: CYSTOSCOPY WITH LEFT STENT PLACEMENT, BLADDER REPAIR, CYSTOSCOPY WITH LEFT STENT REMOVAL;  Surgeon: Raynelle Bring, MD;  Location: [facility];  Service: Urology;  Laterality: Left;  . EXCISION OF ACCESSORY NIPPLE Bilateral 05/30/2013   Procedure: BILATERAL NIPPLE BIOPSY;  Surgeon: Merrie Roof, MD;  Location: [facility];  Service: General;  Laterality: Bilateral;  . EYE SURGERY Bilateral 12   cataracts  . IR RADIOLOGIST EVAL & MGMT  07/26/2017  . LAPAROSCOPIC RIGHT COLECTOMY N/A 09/04/2018   Procedure: LAPAROSCOPIC ASSISTED SIGMOID COLECTOMY WITH REPAIR OF FISTULA TO BLADDER;  Surgeon: Jovita Kussmaul, MD;  Location: [facility];  Service: General;  Laterality: N/A;  . SPLIT NIGHT STUDY  02/02/2016       Current Outpatient Medications on File Prior to Visit  Medication Sig Dispense Refill  . acetaminophen (TYLENOL) 500 MG tablet Take 500-1,000 mg by mouth every 6 (six) hours as needed for headache (pain).    Marland Kitchen amLODipine (NORVASC) 5 MG tablet TAKE 1 & 1/2 (ONE & ONE-HALF) TABLETS BY MOUTH ONCE DAILY (Patient taking differently: 7.5 mg daily. ) 135 tablet 2  . clonazePAM (KLONOPIN) 0.5 MG tablet Take 1 tablet by mouth twice daily as needed for anxiety (Patient taking differently: Take 0.5 mg by mouth at bedtime. ) 60 tablet 3  . cloNIDine (CATAPRES) 0.1 MG tablet Take 1 tablet by mouth twice daily (Patient taking differently: Take 0.1 mg by mouth 2 (two) times daily. ) 180 tablet 3  .  clotrimazole-betamethasone (LOTRISONE) cream Apply 1 application topically 2 (two) times daily. (Patient taking differently: Apply 1 application topically daily as needed (rash). ) 30 g 0  . ELIQUIS 5 MG TABS tablet Take 1 tablet by mouth twice daily (Patient taking differently: Take 5 mg by mouth 2 (two) times daily. ) 180 tablet 1  . famotidine (PEPCID) 20 MG tablet One at bedtime (Patient taking differently: Take 20 mg by mouth daily as needed for heartburn or indigestion. ) 30 tablet 11  . losartan (COZAAR) 100 MG tablet Take 1 tablet by mouth once daily (Patient taking differently: Take 100 mg by mouth daily. ) 90 tablet 3  . nitroGLYCERIN (NITROSTAT) 0.4 MG SL tablet DISSOLVE ONE TABLET UNDER THE TONGUE EVERY 5 MINUTES AS NEEDED FOR CHEST PAIN.  DO NOT EXCEED A TOTAL OF 3 DOSES IN 15 MINUTES (Patient taking differently: Place 0.4 mg under the tongue every 5 (five) minutes as needed for chest pain. ) 25 tablet 0  . torsemide (DEMADEX) 20 MG tablet Take 3 tablets by mouth once daily (Patient taking differently: Take 40 mg by mouth daily. ) 90 tablet 10   No current facility-administered medications on file prior to visit.   Allergies  Allergen Reactions  . Contrast Media [Iodinated Diagnostic Agents] Hives and Other (See Comments)    Per pt strong burning sensation starting in chest radiating outward   . Clonidine Derivatives Other (See Comments)    Throat dry  . Statins Other (See Comments)    "bones hurt"  . Sulfa Antibiotics Diarrhea    Tremors   . Celebrex [Celecoxib] Rash  . Isosorbide Nitrate Itching and Rash  . Other Itching and Rash    Plastic and paper tape and heart monitor pads  . Tape Itching and Rash    Red Where applied and will spread   Social History   Socioeconomic History  . Marital status: Married    Spouse name: Not on file  . Number of children:  Not on file  . Years of education: Not on file  . Highest education level: Not on file  Occupational History  .  Not on file  Tobacco Use  . Smoking status: Never Smoker  . Smokeless tobacco: Never Used  Vaping Use  . Vaping Use: Never used  Substance and Sexual Activity  . Alcohol use: No  . Drug use: No  . Sexual activity: Not Currently  Other Topics Concern  . Not on file  Social History Narrative  . Not on file   Social Determinants of Health   Financial Resource Strain:   . Difficulty of Paying Living Expenses: Not on file  Food Insecurity:   . Worried About Charity fundraiser in the Last Year: Not on file  . Ran Out of Food in the Last Year: Not on file  Transportation Needs:   . Lack of Transportation (Medical): Not on file  . Lack of Transportation (Non-Medical): Not on file  Physical Activity:   . Days of Exercise per Week: Not on file  . Minutes of Exercise per Session: Not on file  Stress:   . Feeling of Stress : Not on file  Social Connections:   . Frequency of Communication with Friends and Family: Not on file  . Frequency of Social Gatherings with Friends and Family: Not on file  . Attends Religious Services: Not on file  . Active Member of Clubs or Organizations: Not on file  . Attends Archivist Meetings: Not on file  . Marital Status: Not on file  Intimate Partner Violence:   . Fear of Current or Ex-Partner: Not on file  . Emotionally Abused: Not on file  . Physically Abused: Not on file  . Sexually Abused: Not on file      Review of Systems  Musculoskeletal: Positive for back pain.  All other systems reviewed and are negative.      Objective:   Physical Exam Vitals reviewed.  Constitutional:      General: She is not in acute distress.    Appearance: Normal appearance. She is well-developed. She is not diaphoretic.  HENT:     Right Ear: External ear normal.     Left Ear: External ear normal.     Nose: Rhinorrhea present. No mucosal edema.     Mouth/Throat:     Pharynx: No oropharyngeal exudate.  Eyes:     Conjunctiva/sclera:  Conjunctivae normal.  Neck:     Thyroid: No thyroid mass.     Vascular: Normal carotid pulses. No carotid bruit or JVD.     Trachea: Trachea and phonation normal.  Cardiovascular:     Rate and Rhythm: Bradycardia present. Rhythm irregularly irregular.     Heart sounds: Normal heart sounds.  Pulmonary:     Effort: Pulmonary effort is normal. No respiratory distress.     Breath sounds: Normal breath sounds. No wheezing or rales.  Abdominal:     General: Bowel sounds are normal. There is no distension.     Palpations: Abdomen is not rigid.     Tenderness: There is no abdominal tenderness. There is no guarding or rebound.  Musculoskeletal:     Cervical back: Normal range of motion and neck supple. No edema or erythema.     Right lower leg: Edema present.     Left lower leg: Edema present.  Lymphadenopathy:     Cervical: No cervical adenopathy.  Skin:    General: Skin is warm.  Findings: No erythema.           Assessment & Plan:  Shortness of breath - Plan: BASIC METABOLIC PANEL WITH GFR  Despite her BNP being normal, I believe her shortness of breath is due to fluid retention.  Her chest x-ray revealed trace pleural effusions and cardiomegaly.  Lab work was significant for anemia that is near her baseline but was otherwise unremarkable.  I will recheck her potassium today given her history of hyperkalemia on spironolactone.  Recheck the patient later this week via telephone prior to her surgery on Thursday.  Continue torsemide at 60 mg a day and spironolactone 12.5 mg a day at the present time unless BMP shows hyperkalemia at which point we will hold spironolactone and continue torsemide at 60 mg a day

## 2020-05-27 ENCOUNTER — Encounter
Admission: RE | Admit: 2020-05-27 | Discharge: 2020-05-27 | Disposition: A | Discharge status: Home / Self Care | Payer: Medicare Other | Source: Ambulatory Visit | Attending: Obstetrics and Gynecology | Admitting: Obstetrics and Gynecology

## 2020-05-27 DIAGNOSIS — Z01818 Encounter for other preprocedural examination: Secondary | ICD-10-CM | POA: Diagnosis not present

## 2020-05-27 DIAGNOSIS — I10 Essential (primary) hypertension: Secondary | ICD-10-CM | POA: Insufficient documentation

## 2020-05-27 DIAGNOSIS — Z20822 Contact with and (suspected) exposure to covid-19: Secondary | ICD-10-CM | POA: Diagnosis not present

## 2020-05-27 LAB — BASIC METABOLIC PANEL WITH GFR
BUN/Creatinine Ratio: 28 (calc) — ABNORMAL HIGH (ref 6–22)
BUN: 35 mg/dL — ABNORMAL HIGH (ref 7–25)
CO2: 26 mmol/L (ref 20–32)
Calcium: 9.5 mg/dL (ref 8.6–10.4)
Chloride: 97 mmol/L — ABNORMAL LOW (ref 98–110)
Creat: 1.27 mg/dL — ABNORMAL HIGH (ref 0.60–0.88)
GFR, Est African American: 45 mL/min/{1.73_m2} — ABNORMAL LOW (ref >=60)
GFR, Est Non African American: 39 mL/min/{1.73_m2} — ABNORMAL LOW (ref >=60)
Glucose, Bld: 119 mg/dL — ABNORMAL HIGH (ref 65–99)
Potassium: 4.6 mmol/L (ref 3.5–5.3)
Sodium: 133 mmol/L — ABNORMAL LOW (ref 135–146)

## 2020-05-27 LAB — BASIC METABOLIC PANEL
Anion gap: 10 (ref 5–15)
BUN: 37 mg/dL — ABNORMAL HIGH (ref 8–23)
CO2: 26 mmol/L (ref 22–32)
Calcium: 9.3 mg/dL (ref 8.9–10.3)
Chloride: 98 mmol/L (ref 98–111)
Creatinine, Ser: 1.04 mg/dL — ABNORMAL HIGH (ref 0.44–1.00)
GFR calc Af Amer: 58 mL/min — ABNORMAL LOW (ref >60)
GFR calc non Af Amer: 50 mL/min — ABNORMAL LOW (ref >60)
Glucose, Bld: 124 mg/dL — ABNORMAL HIGH (ref 70–99)
Potassium: 3.6 mmol/L (ref 3.5–5.1)
Sodium: 134 mmol/L — ABNORMAL LOW (ref 135–145)

## 2020-05-27 LAB — CBC
HCT: 31.8 % — ABNORMAL LOW (ref 36.0–46.0)
Hemoglobin: 10.8 g/dL — ABNORMAL LOW (ref 12.0–15.0)
MCH: 30.4 pg (ref 26.0–34.0)
MCHC: 34 g/dL (ref 30.0–36.0)
MCV: 89.6 fL (ref 80.0–100.0)
Platelets: 242 10*3/uL (ref 150–400)
RBC: 3.55 MIL/uL — ABNORMAL LOW (ref 3.87–5.11)
RDW: 13.1 % (ref 11.5–15.5)
WBC: 5 10*3/uL (ref 4.0–10.5)
nRBC: 0 % (ref 0.0–0.2)

## 2020-05-27 NOTE — Progress Notes (Signed)
[facility] Perioperative Services  Pre-Admission/Anesthesia Testing Clinical Review  Date: 05/27/20  Patient Demographics:  Name: [name] DOB:   07/04/1935 MRN:   [id number]  Planned Surgical Procedure(s):    Case: [id number] Date/Time: 05/29/20 0900   Procedure: DILATATION AND CURETTAGE /HYSTEROSCOPY (N/A )   Anesthesia type: Choice   Pre-op diagnosis:      Postmenopausal bleeding N95.0     Endometrial polyp N84.0   Location: [facility] OR ROOM 05 / [facility] ORS FOR ANESTHESIA GROUP   Surgeons: Homero Fellers, MD     NOTE: Available PAT nursing documentation and vital signs have been reviewed. Clinical nursing staff has updated patient's PMH/PSHx, current medication list, and drug allergies/intolerances to ensure comprehensive history available to assist in medical decision making as it pertains to the aforementioned surgical procedure and anticipated anesthetic course.   Clinical Discussion:  [name] is a 85 y.o. female who is submitted for pre-surgical anesthesia review and clearance prior to her undergoing the above procedure. Patient has never been a smoker. Pertinent PMH includes: CAD, NSTEMI (s/p PCI 2016), atrial fibrillation, Takotsubo cardiomyopathy, HTN, HLD, mild aortic stenosis, dilated aortic root, CHF, DOE, COPD, CVA/TIA, GERD (on daily H2 blocker), hiatal hernia, colon cancer, breast cancer, insomnia (on BZO).  Patient is followed by cardiology [name], MD). She was last seen in the cardiology clinic on 04/17/2020; notes reviewed.  At the time of her clinic visit, patient was doing well from a cardiac standpoint.  She denied any exertional chest pain, increased shortness of breath, PND, orthopnea, vertiginous symptoms, palpitations, and presyncope/syncope.  Patient continues to have mild exertional dyspnea, however notes this is at baseline. Peripheral edema managed with prescribed torsemide. Hypertension controlled on prescribed CCB, alpha-blocker,  and ARB therapies. Patient with atherosclerotic CAD diagnosis. She suffered an NSTEMI in 2016. Subsequent cardiac catheterization revealed revealed nonobstructive disease. Patient reported to be statin intolerant. She is not currently on beta-blocker therapy due to profound associated bradycardia. CHA2DS2-VASc Score = 8. Nuclear stress test in 07/2019 was interpreted as low risk. TTE in 08/26/2019 revealed an LVEF of 60-65%. PASP 51 mmHg with associated moderate to severe TR; PASP in the 60s  in 2017. See full cardiac diagnostic testing results below.  Patient scheduled to undergo hysteroscopy and D&C with Dr. Nechama Guard on 05/29/2020. Given her significant past medical history, presurgical cardiac clearance was sought by the attending surgical team. Per cardiology, "based on ACC/AHA guidelines, patient will be an acceptable risk for the planned procedure without further cardiovascular testing". Again, this patient is on daily anticoagulation therapy. Pharmacy has been consulted. Patient has been instructed on recommendations for holding her apixaban for 1 day prior to her procedure. Patient's last dose of apixaban will be on 05/27/2020.  She denies previous perioperative complications with anesthesia. She underwent a general anesthetic course here (ASA III) in 08/2018 with no documented complications.   Vitals with BMI 05/26/2020 05/23/2020 05/21/2020  Height 5\' 6"  - 5\' 6"   Weight 187 lbs 188 lbs 180 lbs  BMI 30.2 95.09 32.67  Systolic 124 580 -  Diastolic 42 56 -  Pulse 67 64 -    Providers/Specialists:   NOTE: Primary physician provider listed below. Patient Susan have been seen by APP or partner within same practice.   PROVIDER ROLE LAST Mertie Moores, MD OB/GYN (Surgeon) 05/20/2020  [name], MD Primary Care Provider 05/26/2020  Fransico Him, MD Cardiology 04/17/2020   Allergies:  Contrast media [iodinated diagnostic agents], Clonidine  derivatives, Statins, Sulfa antibiotics,  Celebrex [celecoxib], Isosorbide nitrate, Other, and Tape  Current Home Medications:   No current facility-administered medications for this encounter.   Marland Kitchen acetaminophen (TYLENOL) 500 MG tablet  . amLODipine (NORVASC) 5 MG tablet  . clonazePAM (KLONOPIN) 0.5 MG tablet  . cloNIDine (CATAPRES) 0.1 MG tablet  . clotrimazole-betamethasone (LOTRISONE) cream  . ELIQUIS 5 MG TABS tablet  . famotidine (PEPCID) 20 MG tablet  . losartan (COZAAR) 100 MG tablet  . nitroGLYCERIN (NITROSTAT) 0.4 MG SL tablet  . torsemide (DEMADEX) 20 MG tablet   History:   Past Medical History:  Diagnosis Date  . Allergy    rhinitis  . Aortic stenosis    mild by echo 06/2017  . Arthritis   . Bradycardia    a. 10/2017 -> beta blocker cut back due to HR 39.  . Breast cancer ([place]) 01/06/2012  . Cancer [facility])    right colon and left breast  . Chronic diastolic CHF (congestive heart failure) ([place])   . Colon cancer ([place]) 01/06/2012  . Colovesical fistula    Dr. Marlou Starks and [name] planning surgery 08/2018- surgery revealed spontaneous closure  . COPD (chronic obstructive pulmonary disease) ([place])    pt. denies  . Coronary artery disease 2006   a.  NSTEMI in 2016, cath showed 15% prox-mid RCA, 20% prox LAD, EF 25-35% by cath and 35-40% -> felt due to Takotsubo cardiomyopathy.  . Dilated aortic root ([place])    17mmHg by echo 06/2017  . Diverticulosis   . Dyspnea   . Edema extremities   . GERD (gastroesophageal reflux disease)   . Hernia   . Hiatal hernia    denies  . Hyperlipidemia   . Hypertension   . Mild aortic stenosis    echo 11/2015 but not noted on echo 06/2016  . Osteopenia   . Permanent atrial fibrillation (HCC)    chronic atrial fibrillation  . Pneumonia    hx child  . Pulmonary HTN ([place])    a. moderate to severe PASP 63mmHg echo 11/2015 - now 45mmHg by echo 06/2017. CTA chest in 11/16 with no PE. PFTs in 7/15 with mild obstructive lung disease. She had a negative sleep study in 2017. b. Felt due  to left sided HF.  Marland Kitchen Stroke ([place])   . Takotsubo syndrome 07/29/2015   a. EF 35-40% by echo; akinesis of mid-apical anteroseptal and apical myocardium.  EF now normalized on echo 11/2015   Past Surgical History:  Procedure Laterality Date  . APPENDECTOMY    . BREAST SURGERY     lumpectomy left  . CARDIAC CATHETERIZATION    . CARDIAC CATHETERIZATION N/A 07/28/2015   Procedure: Left Heart Cath and Coronary Angiography;  Surgeon: Peter M Martinique, MD;  Location: [facility];  Service: Cardiovascular;  Laterality: N/A;  . CHOLECYSTECTOMY    . COLECTOMY     right side  . CYSTOSCOPY WITH STENT PLACEMENT Left 09/04/2018   Procedure: CYSTOSCOPY WITH LEFT STENT PLACEMENT, BLADDER REPAIR, CYSTOSCOPY WITH LEFT STENT REMOVAL;  Surgeon: Raynelle Bring, MD;  Location: [facility];  Service: Urology;  Laterality: Left;  . EXCISION OF ACCESSORY NIPPLE Bilateral 05/30/2013   Procedure: BILATERAL NIPPLE BIOPSY;  Surgeon: Merrie Roof, MD;  Location: [facility];  Service: General;  Laterality: Bilateral;  . EYE SURGERY Bilateral 12   cataracts  . IR RADIOLOGIST EVAL & MGMT  07/26/2017  . LAPAROSCOPIC RIGHT COLECTOMY N/A 09/04/2018   Procedure: LAPAROSCOPIC ASSISTED SIGMOID COLECTOMY WITH  REPAIR OF FISTULA TO BLADDER;  Surgeon: Jovita Kussmaul, MD;  Location: [facility]S;  Service: General;  Laterality: N/A;  . SPLIT NIGHT STUDY  02/02/2016       Family History  Problem Relation Age of Onset  . Heart disease Mother   . Heart attack Mother   . Cancer Sister        stomach and colon  . Heart disease Brother 82  . Hypertension Father   . Cancer Sister   . Stroke Neg Hx    Social History   Tobacco Use  . Smoking status: Never Smoker  . Smokeless tobacco: Never Used  Vaping Use  . Vaping Use: Never used  Substance Use Topics  . Alcohol use: No  . Drug use: No    Pertinent Clinical Results:  LABS: Labs reviewed: Acceptable for surgery.  Hospital Outpatient Visit on 05/27/2020  Component Date Value  Ref Range Status  . Sodium 05/27/2020 134* 135 - 145 mmol/L Final  . Potassium 05/27/2020 3.6  3.5 - 5.1 mmol/L Final  . Chloride 05/27/2020 98  98 - 111 mmol/L Final  . CO2 05/27/2020 26  22 - 32 mmol/L Final  . Glucose, Bld 05/27/2020 124* 70 - 99 mg/dL Final   Glucose reference range applies only to samples taken after fasting for at least 8 hours.  . BUN 05/27/2020 37* 8 - 23 mg/dL Final  . Creatinine, Ser 05/27/2020 1.04* 0.44 - 1.00 mg/dL Final  . Calcium 05/27/2020 9.3  8.9 - 10.3 mg/dL Final  . GFR calc non Af Amer 05/27/2020 50* >60 mL/min Final  . GFR calc Af Amer 05/27/2020 58* >60 mL/min Final  . Anion gap 05/27/2020 10  5 - 15 Final   Performed at [facility], [address], [place], [place] [address]  . WBC 05/27/2020 5.0  4.0 - 10.5 K/uL Final  . RBC 05/27/2020 3.55* 3.87 - 5.11 MIL/uL Final  . Hemoglobin 05/27/2020 10.8* 12.0 - 15.0 g/dL Final  . HCT 05/27/2020 31.8* 36 - 46 % Final  . MCV 05/27/2020 89.6  80.0 - 100.0 fL Final  . MCH 05/27/2020 30.4  26.0 - 34.0 pg Final  . MCHC 05/27/2020 34.0  30.0 - 36.0 g/dL Final  . RDW 05/27/2020 13.1  11.5 - 15.5 % Final  . Platelets 05/27/2020 242  150 - 400 K/uL Final  . nRBC 05/27/2020 0.0  0.0 - 0.2 % Final   Performed at [facility], [address], [place], [place] [address]  Office Visit on 05/26/2020  Component Date Value Ref Range Status  . Glucose, Bld 05/26/2020 119* 65 - 99 mg/dL Final   Comment: .            Fasting reference interval . For someone without known diabetes, a glucose value between 100 and 125 mg/dL is consistent with prediabetes and should be confirmed with a follow-up test. .   . BUN 05/26/2020 35* 7 - 25 mg/dL Final  . Creat 05/26/2020 1.27* 0.60 - 0.88 mg/dL Final   Comment: For patients >67 years of age, the reference limit for Creatinine is approximately 13% higher for people identified as African-American. .   . GFR, Est Non African American 05/26/2020 39* >  OR = 60 mL/min/1.97m2 Final  . GFR, Est African American 05/26/2020 45* > OR = 60 mL/min/1.13m2 Final  . BUN/Creatinine Ratio 05/26/2020 28* 6 - 22 (calc) Final  . Sodium 05/26/2020 133* 135 - 146 mmol/L Final  . Potassium  05/26/2020 4.6  3.5 - 5.3 mmol/L Final  . Chloride 05/26/2020 97* 98 - 110 mmol/L Final  . CO2 05/26/2020 26  20 - 32 mmol/L Final  . Calcium 05/26/2020 9.5  8.6 - 10.4 mg/dL Final     ECG: Date: 05/23/2020  Time ECG obtained: 0936 AM Rate: 52 bpm Rhythm: atrial fibrillation Axis (leads I and aVF): Normal Intervals: QRS 102 ms. QTc 428 ms. ST segment and T wave changes: No evidence of acute ST segment elevation or depression Comparison: Similar to previous tracing obtained on 07/31/2019   IMAGING / PROCEDURES: ECHOCARDIOGRAM done on 08/14/2019 1. Left ventricular ejection fraction, by visual estimation, is 60 to 65%. The left ventricle has normal function. There is mildly increased left ventricular hypertrophy.  2. Left ventricular diastolic function could not be evaluated.  3. Global right ventricle has normal systolic function.The right ventricular size is mildly enlarged. No increase in right ventricular wall thickness.  4. Left atrial size was moderately dilated.  5. Right atrial size was moderately dilated.  6. Moderate mitral annular calcification.  7. The mitral valve is normal in structure. Mild mitral valve regurgitation. No evidence of mitral stenosis.  8. The tricuspid valve is normal in structure. Tricuspid valve regurgitation moderate-severe.  9. Aortic valve mean gradient measures 11.0 mmHg.  10. The aortic valve is normal in structure. Aortic valve regurgitation is trivial. Mild aortic valve stenosis.  11. The pulmonic valve was normal in structure. Pulmonic valve regurgitation is mild.  12. Moderately elevated pulmonary artery systolic pressure.  13. The tricuspid regurgitant velocity is 3.03 m/s, and with an assumed right atrial pressure of 15  mmHg, the estimated right ventricular systolic pressure is moderately elevated at 51.8 mmHg.  14. The inferior vena cava is normal in size with greater than 50% respiratory variability, suggesting right atrial pressure of 3 mmHg.  15. The average left ventricular global longitudinal strain is -22.7 %.   LEXISCAN done on 07/24/2019 1. Moderate size and intensity mostly fixed apical, apical septal, apical anterior and apical lateral perfusion defect, worse at rest than stress.  2. No reversible ischemia.  3. Suspect breast attenuation artifact.  4. LVEF 70% with normal wall motion.  5. This is a low risk study  LEFT HEART CATHETERIZATION done on 07/28/2015 1. Minor nonobstructive CAD  Prox RCA to Mid RCA lesion, 15% stenosed.  Prox LAD lesion, 20% stenosed. 2. Severe left ventriclar dysfunction.  3. Wall motion abnormality consistent with Takotsubo cardiomyopathy.   Impression and Plan:  [name] has been referred for pre-anesthesia review and clearance prior to her undergoing the planned anesthetic and procedural courses. Available labs, pertinent testing, and imaging results were personally reviewed by me. This patient has been appropriately cleared by cardiology.   Based on clinical review performed today (05/27/20), barring any significant acute changes in the patient's overall condition, it is anticipated that she will be able to proceed with the planned surgical intervention. Any acute changes in clinical condition [place] necessitate her procedure being postponed and/or cancelled. Pre-surgical instructions were reviewed with the patient during her PAT appointment and questions were fielded by PAT clinical staff.  Honor Loh, MSN, APRN, FNP-C, CEN [facility]  Peri-operative Services Nurse Practitioner Phone: [phone] 05/27/20 11:13 AM  NOTE: This note has been prepared using Dragon dictation software. Despite my best ability to proofread, there is always  the potential that unintentional transcriptional errors [place] still occur from this process.

## 2020-05-28 LAB — TYPE AND SCREEN
ABO/RH(D): O POS
Antibody Screen: NEGATIVE

## 2020-05-28 LAB — SARS CORONAVIRUS 2 (TAT 6-24 HRS): SARS Coronavirus 2: NEGATIVE

## 2020-05-29 ENCOUNTER — Encounter
Admission: RE | Disposition: A | Discharge status: Home / Self Care | Payer: Self-pay | Source: Home / Self Care | Attending: Obstetrics and Gynecology

## 2020-05-29 ENCOUNTER — Ambulatory Visit
Admission: RE | Admit: 2020-05-29 | Discharge: 2020-05-29 | Disposition: A | Discharge status: Home / Self Care | Payer: Medicare Other | Attending: Obstetrics and Gynecology | Admitting: Obstetrics and Gynecology

## 2020-05-29 ENCOUNTER — Encounter: Payer: Self-pay | Admitting: Obstetrics and Gynecology

## 2020-05-29 ENCOUNTER — Ambulatory Visit: Payer: Medicare Other | Admitting: Urgent Care

## 2020-05-29 ENCOUNTER — Other Ambulatory Visit: Payer: Self-pay

## 2020-05-29 DIAGNOSIS — K449 Diaphragmatic hernia without obstruction or gangrene: Secondary | ICD-10-CM | POA: Insufficient documentation

## 2020-05-29 DIAGNOSIS — Z91041 Radiographic dye allergy status: Secondary | ICD-10-CM | POA: Diagnosis not present

## 2020-05-29 DIAGNOSIS — J449 Chronic obstructive pulmonary disease, unspecified: Secondary | ICD-10-CM | POA: Insufficient documentation

## 2020-05-29 DIAGNOSIS — Z888 Allergy status to other drugs, medicaments and biological substances status: Secondary | ICD-10-CM | POA: Insufficient documentation

## 2020-05-29 DIAGNOSIS — I272 Pulmonary hypertension, unspecified: Secondary | ICD-10-CM | POA: Insufficient documentation

## 2020-05-29 DIAGNOSIS — R9389 Abnormal findings on diagnostic imaging of other specified body structures: Secondary | ICD-10-CM | POA: Diagnosis not present

## 2020-05-29 DIAGNOSIS — N840 Polyp of corpus uteri: Secondary | ICD-10-CM | POA: Diagnosis not present

## 2020-05-29 DIAGNOSIS — I5032 Chronic diastolic (congestive) heart failure: Secondary | ICD-10-CM | POA: Diagnosis not present

## 2020-05-29 DIAGNOSIS — I2510 Atherosclerotic heart disease of native coronary artery without angina pectoris: Secondary | ICD-10-CM | POA: Diagnosis not present

## 2020-05-29 DIAGNOSIS — Z809 Family history of malignant neoplasm, unspecified: Secondary | ICD-10-CM | POA: Diagnosis not present

## 2020-05-29 DIAGNOSIS — Z882 Allergy status to sulfonamides status: Secondary | ICD-10-CM | POA: Insufficient documentation

## 2020-05-29 DIAGNOSIS — R001 Bradycardia, unspecified: Secondary | ICD-10-CM | POA: Insufficient documentation

## 2020-05-29 DIAGNOSIS — Z91048 Other nonmedicinal substance allergy status: Secondary | ICD-10-CM | POA: Insufficient documentation

## 2020-05-29 DIAGNOSIS — Z9049 Acquired absence of other specified parts of digestive tract: Secondary | ICD-10-CM | POA: Diagnosis not present

## 2020-05-29 DIAGNOSIS — Z7901 Long term (current) use of anticoagulants: Secondary | ICD-10-CM | POA: Insufficient documentation

## 2020-05-29 DIAGNOSIS — I110 Hypertensive heart disease with heart failure: Secondary | ICD-10-CM | POA: Diagnosis not present

## 2020-05-29 DIAGNOSIS — N950 Postmenopausal bleeding: Secondary | ICD-10-CM | POA: Insufficient documentation

## 2020-05-29 DIAGNOSIS — I4821 Permanent atrial fibrillation: Secondary | ICD-10-CM | POA: Diagnosis not present

## 2020-05-29 DIAGNOSIS — Z79899 Other long term (current) drug therapy: Secondary | ICD-10-CM | POA: Insufficient documentation

## 2020-05-29 DIAGNOSIS — Z8249 Family history of ischemic heart disease and other diseases of the circulatory system: Secondary | ICD-10-CM | POA: Diagnosis not present

## 2020-05-29 DIAGNOSIS — Z800 Family history of malignant neoplasm of digestive organs: Secondary | ICD-10-CM | POA: Diagnosis not present

## 2020-05-29 DIAGNOSIS — K219 Gastro-esophageal reflux disease without esophagitis: Secondary | ICD-10-CM | POA: Insufficient documentation

## 2020-05-29 DIAGNOSIS — E785 Hyperlipidemia, unspecified: Secondary | ICD-10-CM | POA: Insufficient documentation

## 2020-05-29 DIAGNOSIS — Z853 Personal history of malignant neoplasm of breast: Secondary | ICD-10-CM | POA: Insufficient documentation

## 2020-05-29 DIAGNOSIS — R0602 Shortness of breath: Secondary | ICD-10-CM | POA: Insufficient documentation

## 2020-05-29 DIAGNOSIS — M8580 Other specified disorders of bone density and structure, unspecified site: Secondary | ICD-10-CM | POA: Diagnosis not present

## 2020-05-29 DIAGNOSIS — M1990 Unspecified osteoarthritis, unspecified site: Secondary | ICD-10-CM | POA: Diagnosis not present

## 2020-05-29 DIAGNOSIS — I350 Nonrheumatic aortic (valve) stenosis: Secondary | ICD-10-CM | POA: Diagnosis not present

## 2020-05-29 DIAGNOSIS — Z85038 Personal history of other malignant neoplasm of large intestine: Secondary | ICD-10-CM | POA: Insufficient documentation

## 2020-05-29 HISTORY — PX: HYSTEROSCOPY WITH D & C: SHX1775

## 2020-05-29 SURGERY — DILATATION AND CURETTAGE /HYSTEROSCOPY
Anesthesia: General

## 2020-05-29 MED ORDER — FENTANYL CITRATE (PF) 100 MCG/2ML IJ SOLN
INTRAMUSCULAR | Status: AC
  Administered 2020-05-29: 25 ug via INTRAVENOUS
  Filled 2020-05-29: qty 2

## 2020-05-29 MED ORDER — ACETAMINOPHEN 10 MG/ML IV SOLN
INTRAVENOUS | Status: AC
  Filled 2020-05-29: qty 100

## 2020-05-29 MED ORDER — SUCCINYLCHOLINE CHLORIDE 20 MG/ML IJ SOLN
INTRAMUSCULAR | Status: DC | PRN
  Administered 2020-05-29: 100 mg via INTRAVENOUS

## 2020-05-29 MED ORDER — ORAL CARE MOUTH RINSE: 15.0000 mL | Freq: Once | OROMUCOSAL | Status: AC

## 2020-05-29 MED ORDER — DEXMEDETOMIDINE HCL 200 MCG/2ML IV SOLN
INTRAVENOUS | Status: DC | PRN
  Administered 2020-05-29: 4 ug via INTRAVENOUS

## 2020-05-29 MED ORDER — FENTANYL CITRATE (PF) 250 MCG/5ML IJ SOLN
INTRAMUSCULAR | Status: AC
  Filled 2020-05-29: qty 5

## 2020-05-29 MED ORDER — PROPOFOL 10 MG/ML IV BOLUS
INTRAVENOUS | Status: DC | PRN
  Administered 2020-05-29: 100 mg via INTRAVENOUS

## 2020-05-29 MED ORDER — CHLORHEXIDINE GLUCONATE 0.12 % MT SOLN
OROMUCOSAL | Status: AC
  Administered 2020-05-29: 15 mL via OROMUCOSAL
  Filled 2020-05-29: qty 15

## 2020-05-29 MED ORDER — PROPOFOL 500 MG/50ML IV EMUL
INTRAVENOUS | Status: AC
  Filled 2020-05-29: qty 50

## 2020-05-29 MED ORDER — FENTANYL CITRATE (PF) 100 MCG/2ML IJ SOLN
25.0000 ug | INTRAMUSCULAR | Status: DC | PRN
  Administered 2020-05-29 (×3): 25 ug via INTRAVENOUS

## 2020-05-29 MED ORDER — CHLORHEXIDINE GLUCONATE 0.12 % MT SOLN: 15.0000 mL | Freq: Once | OROMUCOSAL | Status: AC

## 2020-05-29 MED ORDER — ONDANSETRON HCL 4 MG/2ML IJ SOLN
INTRAMUSCULAR | Status: DC | PRN
  Administered 2020-05-29: 4 mg via INTRAVENOUS

## 2020-05-29 MED ORDER — ACETAMINOPHEN 10 MG/ML IV SOLN
INTRAVENOUS | Status: DC | PRN
  Administered 2020-05-29: 1000 mg via INTRAVENOUS

## 2020-05-29 MED ORDER — DEXAMETHASONE SODIUM PHOSPHATE 10 MG/ML IJ SOLN
INTRAMUSCULAR | Status: DC | PRN
  Administered 2020-05-29: 10 mg via INTRAVENOUS

## 2020-05-29 MED ORDER — LACTATED RINGERS IV SOLN: INTRAVENOUS | Status: DC

## 2020-05-29 MED ORDER — LIDOCAINE HCL (CARDIAC) PF 100 MG/5ML IV SOSY
PREFILLED_SYRINGE | INTRAVENOUS | Status: DC | PRN
  Administered 2020-05-29: 40 mg via INTRAVENOUS

## 2020-05-29 MED ORDER — ONDANSETRON HCL 4 MG/2ML IJ SOLN: 4.0000 mg | Freq: Once | INTRAMUSCULAR | Status: DC | PRN

## 2020-05-29 MED ORDER — FENTANYL CITRATE (PF) 100 MCG/2ML IJ SOLN
INTRAMUSCULAR | Status: DC | PRN
Start: 2020-05-29 — End: 2020-05-29
  Administered 2020-05-29: 75 ug via INTRAVENOUS
  Administered 2020-05-29: 25 ug via INTRAVENOUS

## 2020-05-29 SURGICAL SUPPLY — 21 items
CATH ROBINSON RED A/P 16FR (CATHETERS) ×2 IMPLANT
DEVICE MYOSURE LITE (MISCELLANEOUS) IMPLANT
DEVICE MYOSURE REACH (MISCELLANEOUS) IMPLANT
ELECT REM PT RETURN 9FT ADLT (ELECTROSURGICAL)
ELECTRODE REM PT RTRN 9FT ADLT (ELECTROSURGICAL) IMPLANT
GAUZE 4X4 16PLY RFD (DISPOSABLE) ×2 IMPLANT
GLOVE BIOGEL PI IND STRL 6.5 (GLOVE) ×2 IMPLANT
GLOVE BIOGEL PI INDICATOR 6.5 (GLOVE) ×2
GLOVE SURG SYN 6.5 ES PF (GLOVE) ×2 IMPLANT
GLOVE SURG SYN 6.5 PF PI (GLOVE) ×1 IMPLANT
GOWN STRL REUS W/ TWL LRG LVL3 (GOWN DISPOSABLE) ×2 IMPLANT
GOWN STRL REUS W/TWL LRG LVL3 (GOWN DISPOSABLE) ×4
KIT PROCEDURE FLUENT (KITS) IMPLANT
PACK DNC HYST (MISCELLANEOUS) ×2 IMPLANT
PAD OB MATERNITY 4.3X12.25 (PERSONAL CARE ITEMS) ×2 IMPLANT
PAD PREP 24X41 OB/GYN DISP (PERSONAL CARE ITEMS) ×2 IMPLANT
SEAL ROD LENS SCOPE MYOSURE (ABLATOR) ×2 IMPLANT
SOL .9 NS 3000ML IRR  AL (IV SOLUTION) ×2
SOL .9 NS 3000ML IRR AL (IV SOLUTION) ×1
SOL .9 NS 3000ML IRR UROMATIC (IV SOLUTION) ×1 IMPLANT
TOWEL OR 17X26 4PK STRL BLUE (TOWEL DISPOSABLE) ×2 IMPLANT

## 2020-05-29 NOTE — Transfer of Care (Signed)
Immediate Anesthesia Transfer of Care Note  Patient: [name]  Procedure(s) Performed: DILATATION AND CURETTAGE /HYSTEROSCOPY (N/A )  Patient Location: PACU  Anesthesia Type:General  Level of Consciousness: awake, alert  and oriented  Airway & Oxygen Therapy: Patient connected to face mask oxygen  Post-op Assessment: Report given to RN and Post -op Vital signs reviewed and stable  Post vital signs: Reviewed and stable  Last Vitals:  Vitals Value Taken Time  BP 164/47 05/29/20 1009  Temp    Pulse 50 05/29/20 1010  Resp 28 05/29/20 1010  SpO2 100 % 05/29/20 1010  Vitals shown include unvalidated device data.  Last Pain:  Vitals:   05/29/20 0802  TempSrc: Tympanic  PainSc: 0-No pain         Complications: No complications documented.

## 2020-05-29 NOTE — Anesthesia Preprocedure Evaluation (Addendum)
Anesthesia Evaluation  Patient identified by MRN, date of birth, ID band Patient awake    Reviewed: Allergy & Precautions, H&P , NPO status , Patient's Chart, lab work & pertinent test results, reviewed documented beta blocker date and time   History of Anesthesia Complications Negative for: history of anesthetic complications  Airway Mallampati: II  TM Distance: >3 FB Neck ROM: full    Dental  (+) Edentulous Upper, Edentulous Lower   Pulmonary shortness of breath and with exertion, COPD, neg recent URI,    Pulmonary exam normal breath sounds clear to auscultation       Cardiovascular Exercise Tolerance: Good hypertension, (-) angina+ CAD, +CHF and + DOE  (-) Past MI and (-) Cardiac Stents + dysrhythmias Atrial Fibrillation + Valvular Problems/Murmurs AS  Rhythm:regular Rate:Normal     Neuro/Psych neg Seizures TIAnegative psych ROS   GI/Hepatic Neg liver ROS, hiatal hernia, GERD  ,  Endo/Other  negative endocrine ROS  Renal/GU negative Renal ROS  negative genitourinary   Musculoskeletal   Abdominal   Peds  Hematology negative hematology ROS (+)   Anesthesia Other Findings Past Medical History: No date: Allergy     Comment:  rhinitis No date: Aortic stenosis     Comment:  mild by echo 06/2017 No date: Arthritis No date: Bradycardia     Comment:  a. 10/2017 -> beta blocker cut back due to HR 39. 01/06/2012: Breast cancer ([place]) No date: Cancer ([place])     Comment:  right colon and left breast No date: Chronic diastolic CHF (congestive heart failure) ([place]) 01/06/2012: Colon cancer ([place]) No date: Colovesical fistula     Comment:  Dr. Marlou Starks and [name] planning surgery 08/2018-               surgery revealed spontaneous closure No date: COPD (chronic obstructive pulmonary disease) ([place])     Comment:  pt. denies 2006: Coronary artery disease     Comment:  a.  NSTEMI in 2016, cath showed 15% prox-mid RCA, 20%                prox LAD, EF 25-35% by cath and 35-40% -> felt due to               Takotsubo cardiomyopathy. No date: Dilated aortic root (HCC)     Comment:  92mmHg by echo 06/2017 No date: Diverticulosis No date: Dyspnea No date: Edema extremities No date: GERD (gastroesophageal reflux disease) No date: Hernia No date: Hiatal hernia     Comment:  denies No date: Hyperlipidemia No date: Hypertension No date: Mild aortic stenosis     Comment:  echo 11/2015 but not noted on echo 06/2016 No date: Osteopenia No date: Permanent atrial fibrillation (HCC)     Comment:  chronic atrial fibrillation No date: Pneumonia     Comment:  hx child No date: Pulmonary HTN ([place])     Comment:  a. moderate to severe PASP 49mmHg echo 11/2015 - now               69mmHg by echo 06/2017. CTA chest in 11/16 with no PE.               PFTs in 7/15 with mild obstructive lung disease. She had               a negative sleep study in 2017. b. Felt due to left sided              HF. No  date: Stroke [facility]) 07/29/2015: Takotsubo syndrome     Comment:  a. EF 35-40% by echo; akinesis of mid-apical               anteroseptal and apical myocardium.  EF now normalized on              echo 11/2015   Reproductive/Obstetrics negative OB ROS                            Anesthesia Physical Anesthesia Plan  ASA: III  Anesthesia Plan: General   Post-op Pain Management:    Induction: Intravenous  PONV Risk Score and Plan: 3 and Ondansetron, Dexamethasone and Treatment [place]ua vary due to age or medical condition  Airway Management Planned: LMA  Additional Equipment:   Intra-op Plan:   Post-operative Plan: Extubation in OR  Informed Consent: I have reviewed the patients History and Physical, chart, labs and discussed the procedure including the risks, benefits and alternatives for the proposed anesthesia with the patient or authorized representative who has indicated his/her understanding and acceptance.      Dental Advisory Given  Plan Discussed with: Anesthesiologist, CRNA and Surgeon  Anesthesia Plan Comments:         Anesthesia Quick Evaluation

## 2020-05-29 NOTE — Anesthesia Procedure Notes (Signed)
Procedure Name: Intubation Performed by: Staci Acosta, CRNA Pre-anesthesia Checklist: Patient identified, Emergency Drugs available, Suction available and Patient being monitored Patient Re-evaluated:Patient Re-evaluated prior to induction Oxygen Delivery Method: Circle system utilized Preoxygenation: Pre-oxygenation with 100% oxygen Induction Type: IV induction Ventilation: Mask ventilation without difficulty LMA Size: 3.0 Laryngoscope Size: McGraph Grade View: Grade I Tube type: Oral Tube size: 7.0 mm Number of attempts: 1 Airway Equipment and Method: Stylet and Oral airway Placement Confirmation: ETT inserted through vocal cords under direct vision,  positive ETCO2 and breath sounds checked- equal and bilateral Secured at: 22 cm Tube secured with: Tape Dental Injury: Teeth and Oropharynx as per pre-operative assessment

## 2020-05-29 NOTE — Anesthesia Postprocedure Evaluation (Signed)
Anesthesia Post Note  Patient: [name]  Procedure(s) Performed: DILATATION AND CURETTAGE /HYSTEROSCOPY (N/A )  Patient location during evaluation: PACU Anesthesia Type: General Level of consciousness: awake and alert Pain management: pain level controlled Vital Signs Assessment: post-procedure vital signs reviewed and stable Respiratory status: spontaneous breathing, nonlabored ventilation, respiratory function stable and patient connected to nasal cannula oxygen Cardiovascular status: blood pressure returned to baseline and stable Postop Assessment: no apparent nausea or vomiting Anesthetic complications: no   No complications documented.   Last Vitals:  Vitals:   05/29/20 1054 05/29/20 1103  BP: (!) 149/48 (!) 156/66  Pulse: (!) 52 61  Resp: 14 16  Temp: (!) 36.1 C (!) 36.1 C  SpO2: 95% 96%    Last Pain:  Vitals:   05/29/20 1103  TempSrc: Temporal  PainSc:                  Martha Clan

## 2020-05-29 NOTE — Interval H&P Note (Signed)
History and Physical Interval Note:  05/29/2020 9:12 AM  [name]  has presented today for surgery, with the diagnosis of Postmenopausal bleeding N95.0 Endometrial polyp N84.0.  The various methods of treatment have been discussed with the patient and family. After consideration of risks, benefits and other options for treatment, the patient has consented to  Procedure(s): DILATATION AND CURETTAGE /HYSTEROSCOPY (N/A) as a surgical intervention.  The patient's history has been reviewed, patient examined, no change in status, stable for surgery.  I have reviewed the patient's chart and labs.  Questions were answered to the patient's satisfaction.     [place]

## 2020-05-29 NOTE — Op Note (Signed)
Operative Note  05/29/2020  PRE-OP DIAGNOSIS: Postmenopausal bleeding  POST-OP DIAGNOSIS: same   SURGEON: Jeovany Huitron MD  PROCEDURE: Procedure(s): DILATATION AND CURETTAGE /HYSTEROSCOPY   ANESTHESIA: Choice   ESTIMATED BLOOD LOSS: less than 5 cc   SPECIMENS:  Endometrial currettings  FLUID DEFICIT: minimal  COMPLICATIONS: None  DISPOSITION: PACU - hemodynamically stable.  CONDITION: stable  FINDINGS: Exam under anesthesia revealed  8cm uterus with bilateral adnexa without masses or fullness. Hysteroscopy revealed normal uterine cavity with bilateral tubal ostia and normal appearing endocervical canal. Small calcification and thickening in the left lower portion of the uterus which was sampled with the myosure  PROCEDURE IN DETAIL: After informed consent was obtained, the patient was taken to the operating room where anesthesia was obtained without difficulty. The patient was positioned in the dorsal lithotomy position in [place]. The patient's bladder was catheterized with an in and out foley catheter. The patient was examined under anesthesia, with the above noted findings. The weightedspeculum was placed inside the patient's vagina, and the the anterior lip of the cervix was seen and grasped with the tenaculum.  The uterine cavity was sounded to 7cm, and then the cervix was progressively dilated to a 15 French-Pratt dilator. The 0 degree hysteroscope was introduced, with saline fluid used to distend the intrauterine cavity, with the above noted findings.  The Myosure was used to remove the uterine calcification and sample the endometrium.  Once the cavity was sampled entirely the hysteroscope was removed.   The uterine cavity was curetted until a gritty texture was noted, yielding endometrial curettings. Excellent hemostasis was noted, and all instruments were removed, with excellent hemostasis noted throughout. She was then taken out of dorsal lithotomy. Minimal  discrepancy in fluid was noted.  The patient tolerated the procedure well. Sponge, lap and needle counts were correct x2. The patient was taken to recovery room in excellent condition.  Adrian Prows MD [facility] 05/29/2020 10:05 AM

## 2020-05-29 NOTE — Discharge Instructions (Addendum)
Hysteroscopy, Care After This sheet gives you information about how to care for yourself after your procedure. Your health care provider Rabon also give you more specific instructions. If you have problems or questions, contact your health care provider. What can I expect after the procedure? After the procedure, it is common to have:  Cramping.  Bleeding. This can vary from light spotting to menstrual-like bleeding. Follow these instructions at home: Activity  Rest for 1-2 days after the procedure.  Do not douche, use tampons, or have sex for 2 weeks after the procedure, or until your health care provider approves.  Do not drive for 24 hours after the procedure, or for as long as told by your health care provider.  Do not drive, use heavy machinery, or drink alcohol while taking prescription pain medicines. Medicines   Take over-the-counter and prescription medicines only as told by your health care provider.  Do not take aspirin during recovery. It can increase the risk of bleeding. General instructions  Do not take baths, swim, or use a hot tub until your health care provider approves. Take showers instead of baths for 2 weeks, or for as long as told by your health care provider.  To prevent or treat constipation while you are taking prescription pain medicine, your health care provider Evetts recommend that you: ? Drink enough fluid to keep your urine clear or pale yellow. ? Take over-the-counter or prescription medicines. ? Eat foods that are high in fiber, such as fresh fruits and vegetables, whole grains, and beans. ? Limit foods that are high in fat and processed sugars, such as fried and sweet foods.  Keep all follow-up visits as told by your health care provider. This is important. Contact a health care provider if:  You feel dizzy or lightheaded.  You feel nauseous.  You have abnormal vaginal discharge.  You have a rash.  You have pain that does not get better with  medicine.  You have chills. Get help right away if:  You have bleeding that is heavier than a normal menstrual period.  You have a fever.  You have pain or cramps that get worse.  You develop new abdominal pain.  You faint.  You have pain in your shoulders.  You have shortness of breath. Summary  After the procedure, you Offner have cramping and some vaginal bleeding.  Do not douche, use tampons, or have sex for 2 weeks after the procedure, or until your health care provider approves.  Do not take baths, swim, or use a hot tub until your health care provider approves. Take showers instead of baths for 2 weeks, or for as long as told by your health care provider.  Report any unusual symptoms to your health care provider.  Keep all follow-up visits as told by your health care provider. This is important. This information is not intended to replace advice given to you by your health care provider. Make sure you discuss any questions you have with your health care provider. Document Revised: 08/05/2017 Document Reviewed: 09/21/2016 Elsevier Patient Education  2020 [employer]   AMBULATORY SURGERY  DISCHARGE INSTRUCTIONS   1) The drugs that you were given will stay in your system until tomorrow so for the next 24 hours you should not:  A) Drive an automobile B) Make any legal decisions C) Drink any alcoholic beverage   2) You [place] resume regular meals tomorrow.  Today it is better to start with liquids and gradually work up to   solid foods.  You [place] eat anything you prefer, but it is better to start with liquids, then soup and crackers, and gradually work up to solid foods.   3) Please notify your doctor immediately if you have any unusual bleeding, trouble breathing, redness and pain at the surgery site, drainage, fever, or pain not relieved by medication.    4) Additional Instructions:        Please contact your physician with any problems or Same Day Surgery  at [phone], Monday through Friday 6 am to 4 pm, or [facility] number at [phone]. 

## 2020-05-29 NOTE — OR Nursing (Signed)
Per Theadora Rama in [place] #7 patient [place] resume taking her eliquis tomorrow 05/30/20 per Dr. Gilman Schmidt.  Added to d/c instructions/med section.

## 2020-05-30 ENCOUNTER — Encounter: Payer: Self-pay | Admitting: Obstetrics and Gynecology

## 2020-05-30 ENCOUNTER — Telehealth: Payer: Self-pay

## 2020-05-30 LAB — SURGICAL PATHOLOGY

## 2020-05-30 NOTE — Telephone Encounter (Signed)
Pt returning call; had surgery yesterday c CRS.  [phone]

## 2020-06-03 ENCOUNTER — Ambulatory Visit (INDEPENDENT_AMBULATORY_CARE_PROVIDER_SITE_OTHER): Payer: Medicare Other | Admitting: Family Medicine

## 2020-06-03 ENCOUNTER — Other Ambulatory Visit: Payer: Self-pay

## 2020-06-03 VITALS — BP 130/40 | HR 60 | Temp 98.1°F | Ht 66.0 in | Wt 186.0 lb

## 2020-06-03 DIAGNOSIS — R0602 Shortness of breath: Secondary | ICD-10-CM | POA: Diagnosis not present

## 2020-06-03 DIAGNOSIS — I5181 Takotsubo syndrome: Secondary | ICD-10-CM

## 2020-06-03 DIAGNOSIS — I4821 Permanent atrial fibrillation: Secondary | ICD-10-CM | POA: Diagnosis not present

## 2020-06-03 NOTE — Progress Notes (Signed)
Subjective:    Patient ID: [name], female    DOB: 09-03-1935, 85 y.o.   MRN: [id number]  7/21 In 2016, the patient was diagnosed with Takotsubo cardiomyopathy with an ejection fraction of 35 to 40%.  For that reason she is on spironolactone coupled with losartan as well as torsemide.  Previously she was taking 60 mg of torsemide daily.  She has since reduced to 40 mg a day.  She saw my partner recently and was diagnosed with hyponatremia and hyperkalemia.  Sodium levels were around 130 and potassium was 5.5.  She was given Kayexalate x1 and told to follow-up with me today.  She denies any chest pain shortness of breath dyspnea on exertion.  At that time, my plan was: I believe her hyponatremia and hyperkalemia is due to aldosterone suppression due to the combination of losartan and spironolactone.  Fortunately this is improved her ejection fraction from 35% to 65% on an echocardiogram in December of last year.  However if her potassium remains elevated, I would recommend discontinuation of spironolactone and we Yohn even need to reduce the dose of losartan.  I will await the results of the BMP before making any further decisions.  Patient is completely asymptomatic.  05/23/20 Since I last saw the patient, spironolactone has been discontinued.  She states that over the last week she has developed some discomfort in her lower chest just above the xiphoid process.  She attributes it to allergies and postnasal drip.  The pain is constant at a 2 on a scale of 1-10.  There is no exacerbating or alleviating factors.  She denies any cough.  However she does report shortness of breath with activity.  She states that she is becoming more easily winded.  This seemed to occur after decreasing her torsemide from 60-40 and stopping spironolactone.  She denies any orthopnea or paroxysmal nocturnal dyspnea. However when I saw the patient in July she weighed 179 pounds.  Today she is 188 pounds.  She has trace pitting  edema in both legs up to the knee.  Her lungs are relatively clear to auscultation bilaterally.  She had a stress test performed in November of last year that showed a normal ejection fraction of 70% and a fixed defect that was attributed to breast attenuation.  It was felt to be low risk for ischemia.  I can reproduce the patient's chest pain today by palpation on the costochondral margin.  She winces and is sore when I press on her breastbone.  This seems to reproduce the pain exactly making me feel that this is more likely muscular however I believe the shortness of breath could be due to fluid retention.  At that time, my plan was: Given the sudden weight gain, I believe the shortness of breath is likely secondary to fluid retention coupled with her underlying Takotsubo syndrome.  The shortness of breath would coincide with the reduced dose of her diuretics.  She also has increased weight gain and pitting edema in her legs.  I believe the chest pain is most likely due to muscle soreness as I can reproduce the pain exactly with palpation and exercise does not exacerbate the pain.  Furthermore is been present for more than a week and she does not appear in any acute distress today.  She states that the pain has been constant for a week with no exacerbating or alleviating factors.  It does not seem to be associated with food.  Is not  associated with nausea and vomiting.  EKG today shows ST segment depression in lead I and aVL.  However this is a chronic finding it was present on her EKG previously.  There are no acute changes.  There is no signs of ischemia that are new on her EKG..  Therefore I will increase her torsemide to 60 mg a day and have the patient resume her spironolactone 12.5 mg poqday.  Recheck here next week to see if her shortness of breath is improving and also to repeat a potassium level.  Meanwhile obtain a BNP, a chest x-ray, and a CBC along with a CMP.  Seek medical attention immediately over  the weekend if worsening  05/26/20 Patient's weight is down 1 pound.  She started the spironolactone Sunday.  She is taken 2 doses.  She increased her torsemide to 60 mg a day on Friday.  She states that her breathing feels much better despite only having 1 pound less than weight.  She denies any further chest pain.  She still feels somewhat short of breath with activity however and her weight is still elevated above her baseline.  At that time, my plan was: Despite her BNP being normal, I believe her shortness of breath is due to fluid retention.  Her chest x-ray revealed trace pleural effusions and cardiomegaly.  Lab work was significant for anemia that is near her baseline but was otherwise unremarkable.  I will recheck her potassium today given her history of hyperkalemia on spironolactone.  Recheck the patient later this week via telephone prior to her surgery on Thursday.  Continue torsemide at 60 mg a day and spironolactone 12.5 mg a day at the present time unless BMP shows hyperkalemia at which point we will hold spironolactone and continue torsemide at 60 mg a day  06/03/20 Since the last time I saw the patient, she has had a dilation and curettage and hysteroscopy performed.  Prior to the surgery, we discovered that her renal function was elevated and therefore I discontinued spironolactone but I suggested that she continue the 60 mg a day of torsemide.  She continues to be on 60 mg a day of torsemide even after the surgery.  She states that her breathing is better.  Her weight is essentially the same however the swelling in her legs is better and her lungs are much clear to auscultation.  I do not appreciate any crackles in the lungs.  There is no JVD.  She denies any chest pain or chest tightness.  Overall she states that she feels like her breathing has improved on the higher dose of diuretic. Past Medical History:  Diagnosis Date  . Allergy    rhinitis  . Aortic stenosis    mild by echo  06/2017  . Arthritis   . Bradycardia    a. 10/2017 -> beta blocker cut back due to HR 39.  . Breast cancer ([place]) 01/06/2012  . Cancer [facility])    right colon and left breast  . Chronic diastolic CHF (congestive heart failure) ([place])   . Colon cancer ([place]) 01/06/2012  . Colovesical fistula    Dr. Marlou Starks and [name] planning surgery 08/2018- surgery revealed spontaneous closure  . COPD (chronic obstructive pulmonary disease) ([place])    pt. denies  . Coronary artery disease 2006   a.  NSTEMI in 2016, cath showed 15% prox-mid RCA, 20% prox LAD, EF 25-35% by cath and 35-40% -> felt due to Takotsubo cardiomyopathy.  . Dilated aortic root (  [place])    5mmHg by echo 06/2017  . Diverticulosis   . Dyspnea   . Edema extremities   . GERD (gastroesophageal reflux disease)   . Hernia   . Hiatal hernia    denies  . Hyperlipidemia   . Hypertension   . Mild aortic stenosis    echo 11/2015 but not noted on echo 06/2016  . Osteopenia   . Permanent atrial fibrillation ([facility])    chronic atrial fibrillation  . Pneumonia    hx child  . Pulmonary HTN ([place])    a. moderate to severe PASP 63mmHg echo 11/2015 - now 3mmHg by echo 06/2017. CTA chest in 11/16 with no PE. PFTs in 7/15 with mild obstructive lung disease. She had a negative sleep study in 2017. b. Felt due to left sided HF.  Marland Kitchen Stroke ([place])   . Takotsubo syndrome 07/29/2015   a. EF 35-40% by echo; akinesis of mid-apical anteroseptal and apical myocardium.  EF now normalized on echo 11/2015   Past Surgical History:  Procedure Laterality Date  . APPENDECTOMY    . BREAST SURGERY     lumpectomy left  . CARDIAC CATHETERIZATION    . CARDIAC CATHETERIZATION N/A 07/28/2015   Procedure: Left Heart Cath and Coronary Angiography;  Surgeon: Peter M Martinique, MD;  Location: [facility];  Service: Cardiovascular;  Laterality: N/A;  . CHOLECYSTECTOMY    . COLECTOMY     right side  . CYSTOSCOPY WITH STENT PLACEMENT Left 09/04/2018   Procedure: CYSTOSCOPY WITH  LEFT STENT PLACEMENT, BLADDER REPAIR, CYSTOSCOPY WITH LEFT STENT REMOVAL;  Surgeon: Raynelle Bring, MD;  Location: [facility];  Service: Urology;  Laterality: Left;  . EXCISION OF ACCESSORY NIPPLE Bilateral 05/30/2013   Procedure: BILATERAL NIPPLE BIOPSY;  Surgeon: Merrie Roof, MD;  Location: [facility];  Service: General;  Laterality: Bilateral;  . EYE SURGERY Bilateral 12   cataracts  . HYSTEROSCOPY WITH D & C N/A 05/29/2020   Procedure: DILATATION AND CURETTAGE /HYSTEROSCOPY;  Surgeon: Homero Fellers, MD;  Location: [facility];  Service: Gynecology;  Laterality: N/A;  . IR RADIOLOGIST EVAL & MGMT  07/26/2017  . LAPAROSCOPIC RIGHT COLECTOMY N/A 09/04/2018   Procedure: LAPAROSCOPIC ASSISTED SIGMOID COLECTOMY WITH REPAIR OF FISTULA TO BLADDER;  Surgeon: Jovita Kussmaul, MD;  Location: [facility];  Service: General;  Laterality: N/A;  . SPLIT NIGHT STUDY  02/02/2016       Current Outpatient Medications on File Prior to Visit  Medication Sig Dispense Refill  . acetaminophen (TYLENOL) 500 MG tablet Take 500-1,000 mg by mouth every 6 (six) hours as needed for headache (pain).    Marland Kitchen amLODipine (NORVASC) 5 MG tablet TAKE 1 & 1/2 (ONE & ONE-HALF) TABLETS BY MOUTH ONCE DAILY (Patient taking differently: 7.5 mg daily. ) 135 tablet 2  . clonazePAM (KLONOPIN) 0.5 MG tablet Take 1 tablet by mouth twice daily as needed for anxiety (Patient taking differently: Take 0.5 mg by mouth at bedtime. ) 60 tablet 3  . cloNIDine (CATAPRES) 0.1 MG tablet Take 1 tablet by mouth twice daily (Patient taking differently: Take 0.1 mg by mouth 2 (two) times daily. ) 180 tablet 3  . clotrimazole-betamethasone (LOTRISONE) cream Apply 1 application topically 2 (two) times daily. (Patient taking differently: Apply 1 application topically daily as needed (rash). ) 30 g 0  . ELIQUIS 5 MG TABS tablet Take 1 tablet by mouth twice daily (Patient taking differently: Take 5 mg by mouth 2 (two) times daily. ) 180  tablet 1  . famotidine (PEPCID)  20 MG tablet One at bedtime (Patient taking differently: Take 20 mg by mouth daily as needed for heartburn or indigestion. ) 30 tablet 11  . losartan (COZAAR) 100 MG tablet Take 1 tablet by mouth once daily (Patient taking differently: Take 100 mg by mouth daily. ) 90 tablet 3  . nitroGLYCERIN (NITROSTAT) 0.4 MG SL tablet DISSOLVE ONE TABLET UNDER THE TONGUE EVERY 5 MINUTES AS NEEDED FOR CHEST PAIN.  DO NOT EXCEED A TOTAL OF 3 DOSES IN 15 MINUTES (Patient taking differently: Place 0.4 mg under the tongue every 5 (five) minutes as needed for chest pain. ) 25 tablet 0  . torsemide (DEMADEX) 20 MG tablet Take 3 tablets by mouth once daily (Patient taking differently: Take 40 mg by mouth daily. ) 90 tablet 10   No current facility-administered medications on file prior to visit.   Allergies  Allergen Reactions  . Contrast Media [Iodinated Diagnostic Agents] Hives and Other (See Comments)    Per pt strong burning sensation starting in chest radiating outward   . Clonidine Derivatives Other (See Comments)    Throat dry  . Statins Other (See Comments)    "bones hurt"  . Sulfa Antibiotics Diarrhea    Tremors   . Celebrex [Celecoxib] Rash  . Isosorbide Nitrate Itching and Rash  . Other Itching and Rash    Plastic and paper tape and heart monitor pads  . Tape Itching and Rash    Red Where applied and will spread   Social History   Socioeconomic History  . Marital status: Married    Spouse name: Not on file  . Number of children: Not on file  . Years of education: Not on file  . Highest education level: Not on file  Occupational History  . Not on file  Tobacco Use  . Smoking status: Never Smoker  . Smokeless tobacco: Never Used  Vaping Use  . Vaping Use: Never used  Substance and Sexual Activity  . Alcohol use: No  . Drug use: No  . Sexual activity: Not Currently  Other Topics Concern  . Not on file  Social History Narrative  . Not on file   Social Determinants of Health    Financial Resource Strain:   . Difficulty of Paying Living Expenses: Not on file  Food Insecurity:   . Worried About Charity fundraiser in the Last Year: Not on file  . Ran Out of Food in the Last Year: Not on file  Transportation Needs:   . Lack of Transportation (Medical): Not on file  . Lack of Transportation (Non-Medical): Not on file  Physical Activity:   . Days of Exercise per Week: Not on file  . Minutes of Exercise per Session: Not on file  Stress:   . Feeling of Stress : Not on file  Social Connections:   . Frequency of Communication with Friends and Family: Not on file  . Frequency of Social Gatherings with Friends and Family: Not on file  . Attends Religious Services: Not on file  . Active Member of Clubs or Organizations: Not on file  . Attends Archivist Meetings: Not on file  . Marital Status: Not on file  Intimate Partner Violence:   . Fear of Current or Ex-Partner: Not on file  . Emotionally Abused: Not on file  . Physically Abused: Not on file  . Sexually Abused: Not on file      Review of Systems  Musculoskeletal: Positive for back pain.  All other systems reviewed and are negative.      Objective:   Physical Exam Vitals reviewed.  Constitutional:      General: She is not in acute distress.    Appearance: Normal appearance. She is well-developed. She is not diaphoretic.  HENT:     Right Ear: External ear normal.     Left Ear: External ear normal.     Nose: No mucosal edema.     Mouth/Throat:     Pharynx: No oropharyngeal exudate.  Neck:     Thyroid: No thyroid mass.     Vascular: Normal carotid pulses. No carotid bruit or JVD.     Trachea: Trachea and phonation normal.  Cardiovascular:     Rate and Rhythm: Bradycardia present. Rhythm irregularly irregular.     Heart sounds: Normal heart sounds.  Pulmonary:     Effort: Pulmonary effort is normal. No respiratory distress.     Breath sounds: Normal breath sounds. No wheezing or  rales.  Abdominal:     General: Bowel sounds are normal. There is no distension.     Palpations: Abdomen is not rigid.     Tenderness: There is no abdominal tenderness. There is no guarding or rebound.  Musculoskeletal:     Cervical back: Normal range of motion and neck supple. No edema or erythema.     Right lower leg: No edema.     Left lower leg: No edema.  Lymphadenopathy:     Cervical: No cervical adenopathy.  Skin:    General: Skin is warm.     Findings: No erythema.           Assessment & Plan:  Permanent atrial fibrillation ([place]) - Plan: BASIC METABOLIC PANEL WITH GFR, CBC with Differential/Platelet  Shortness of breath - Plan: BASIC METABOLIC PANEL WITH GFR  Takotsubo syndrome  Today the patient appears to be euvolemic.  There is no swelling in her legs.  Her lungs are clear to auscultation.  Therefore I will check a BMP.  If her renal function is stable and her potassium is acceptable we will continue the patient indefinitely on torsemide 60 mg a day.  Also check a CBC to monitor for any postoperative anemia.  She has a postop follow-up with her gynecologist tomorrow

## 2020-06-04 ENCOUNTER — Encounter: Payer: Self-pay | Admitting: Obstetrics and Gynecology

## 2020-06-04 ENCOUNTER — Ambulatory Visit (INDEPENDENT_AMBULATORY_CARE_PROVIDER_SITE_OTHER): Payer: Medicare Other | Admitting: Obstetrics and Gynecology

## 2020-06-04 VITALS — BP 118/70 | HR 57 | Resp 18 | Ht 66.0 in | Wt 187.0 lb

## 2020-06-04 DIAGNOSIS — Z9889 Other specified postprocedural states: Secondary | ICD-10-CM

## 2020-06-04 DIAGNOSIS — N950 Postmenopausal bleeding: Secondary | ICD-10-CM

## 2020-06-04 LAB — CBC WITH DIFFERENTIAL/PLATELET
Absolute Monocytes: 707 cells/uL (ref 200–950)
Basophils Absolute: 40 cells/uL (ref 0–200)
Basophils Relative: 0.7 %
Eosinophils Absolute: 91 cells/uL (ref 15–500)
Eosinophils Relative: 1.6 %
HCT: 31.3 % — ABNORMAL LOW (ref 35.0–45.0)
Hemoglobin: 10.1 g/dL — ABNORMAL LOW (ref 11.7–15.5)
Lymphs Abs: 1721 cells/uL (ref 850–3900)
MCH: 29.5 pg (ref 27.0–33.0)
MCHC: 32.3 g/dL (ref 32.0–36.0)
MCV: 91.5 fL (ref 80.0–100.0)
MPV: 9.7 fL (ref 7.5–12.5)
Monocytes Relative: 12.4 %
Neutro Abs: 3141 cells/uL (ref 1500–7800)
Neutrophils Relative %: 55.1 %
Platelets: 261 10*3/uL (ref 140–400)
RBC: 3.42 10*6/uL — ABNORMAL LOW (ref 3.80–5.10)
RDW: 12.3 % (ref 11.0–15.0)
Total Lymphocyte: 30.2 %
WBC: 5.7 10*3/uL (ref 3.8–10.8)

## 2020-06-04 LAB — BASIC METABOLIC PANEL WITH GFR
BUN/Creatinine Ratio: 28 (calc) — ABNORMAL HIGH (ref 6–22)
BUN: 29 mg/dL — ABNORMAL HIGH (ref 7–25)
CO2: 28 mmol/L (ref 20–32)
Calcium: 9.4 mg/dL (ref 8.6–10.4)
Chloride: 96 mmol/L — ABNORMAL LOW (ref 98–110)
Creat: 1.03 mg/dL — ABNORMAL HIGH (ref 0.60–0.88)
GFR, Est African American: 58 mL/min/{1.73_m2} — ABNORMAL LOW (ref >=60)
GFR, Est Non African American: 50 mL/min/{1.73_m2} — ABNORMAL LOW (ref >=60)
Glucose, Bld: 105 mg/dL — ABNORMAL HIGH (ref 65–99)
Potassium: 4.4 mmol/L (ref 3.5–5.3)
Sodium: 133 mmol/L — ABNORMAL LOW (ref 135–146)

## 2020-06-04 NOTE — Progress Notes (Signed)
  Postoperative Follow-up Patient presents post op from diagnostic hysteroscopy for postmenopausal bleeding, 1 week ago.  Subjective: Patient reports some improvement in her preop symptoms. Eating a regular diet without difficulty. The patient is not having any pain.  Activity: normal activities of daily living. Patient reports additional symptom's since surgery of bleeding. Using menstrual pads at home. Reports bleeding is moderate similar to a period. Pad she has on showed a quarter size amount of blood after 1 hour. Hemoglobin performed yesterday is stable from last week.   Objective: BP 118/70   Pulse (!) 57   Resp 18   Ht 5\' 6"  (1.676 m)   Wt 187 lb (84.8 kg)   SpO2 97%   BMI 30.18 kg/m  Physical Exam Constitutional:      Appearance: She is well-developed.  Genitourinary:     Vagina and uterus normal.     No lesions in the vagina.     No cervical motion tenderness.     No right or left adnexal mass present.     Genitourinary Comments: External: Vulva normal. No lesions noted.  Speculum examination:  Cervix normal . Scant dark red blood in the vaginal vault. No discharge.  No gushing of continuous filling of the vaginal vault of blood. No large clots.   HENT:     Head: Normocephalic and atraumatic.  Neck:     Thyroid: No thyromegaly.  Cardiovascular:     Rate and Rhythm: Normal rate and regular rhythm.     Heart sounds: Normal heart sounds.  Pulmonary:     Effort: Pulmonary effort is normal.     Breath sounds: Normal breath sounds.  Chest:     Breasts:        Right: No inverted nipple, mass, nipple discharge or skin change.        Left: No inverted nipple, mass, nipple discharge or skin change.  Abdominal:     General: Bowel sounds are normal. There is no distension.     Palpations: Abdomen is soft. There is no mass.  Musculoskeletal:     Cervical back: Neck supple.  Neurological:     Mental Status: She is alert and oriented to person, place, and time.  Skin:     General: Skin is warm and dry.  Psychiatric:        Behavior: Behavior normal.        Thought Content: Thought content normal.        Judgment: Judgment normal.  Vitals reviewed.    Assessment: s/p :  diagnostic hysteroscopy stable  Plan: Patient has done well after surgery with no apparent complications.  I have discussed the post-operative course to date, and the expected progress moving forward.  The patient understands what complications to be concerned about.  I will see the patient in routine follow up, or sooner if needed.    Continued small bleeding from surgery. This is likely secondary to use of eliquis and [place]agh continue for 2-3 more weeks. Will continue to monitor and follow up with patient in 2 weeks.  Pathology did not show hyperplasia or malignancy.   Activity plan: No heavy lifting.  Pelvic rest.  Travontae Freiberger R Evlyn Amason 06/04/2020, 2:32 PM

## 2020-06-04 NOTE — Progress Notes (Signed)
Pt states she's here for a Post op visit. Pt states she is still bleeding since the surgery. Pt states she's passing blood clots.

## 2020-06-05 DIAGNOSIS — N950 Postmenopausal bleeding: Secondary | ICD-10-CM | POA: Diagnosis present

## 2020-06-18 ENCOUNTER — Ambulatory Visit (INDEPENDENT_AMBULATORY_CARE_PROVIDER_SITE_OTHER): Payer: Medicare Other | Admitting: Obstetrics and Gynecology

## 2020-06-18 ENCOUNTER — Other Ambulatory Visit: Payer: Self-pay

## 2020-06-18 VITALS — BP 122/70 | Ht 66.0 in | Wt 187.0 lb

## 2020-06-18 DIAGNOSIS — Z9889 Other specified postprocedural states: Secondary | ICD-10-CM

## 2020-06-18 NOTE — Progress Notes (Signed)
Pt states she's here for a follow up . Pt states her bleeding has slowed down considerably since her last visit.

## 2020-06-30 ENCOUNTER — Encounter: Payer: Self-pay | Admitting: Obstetrics and Gynecology

## 2020-06-30 NOTE — Progress Notes (Signed)
  Postoperative Follow-up Patient presents post op from diagnostic hysteroscopy for abnormal uterine bleeding, 3 weeks ago.  Subjective: Patient reports marked improvement in her preop symptoms. Eating a regular diet without difficulty. The patient is not having any pain.  Activity: normal activities of daily living. Patient reports additional symptom's since surgery of None. Bleeding has resolved.   Objective: BP 122/70   Ht 5\' 6"  (1.676 m)   Wt 187 lb (84.8 kg)   BMI 30.18 kg/m  Physical Exam Constitutional:      Appearance: Normal appearance.  Abdominal:     General: Abdomen is flat.     Palpations: Abdomen is soft.  Neurological:     Mental Status: She is alert and oriented to person, place, and time.  Psychiatric:        Mood and Affect: Mood normal.        Behavior: Behavior normal.        Thought Content: Thought content normal.        Judgment: Judgment normal.     Assessment: s/p :  diagnostic hysteroscopy stable  Plan: Patient has done well after surgery with no apparent complications.  I have discussed the post-operative course to date, and the expected progress moving forward.  The patient understands what complications to be concerned about.  I will see the patient in routine follow up, or sooner if needed.    Activity plan: No restriction.  Pelvic rest.  Shawan Corella R Adler Chartrand 06/30/2020, 5:50 PM

## 2020-07-02 ENCOUNTER — Other Ambulatory Visit: Payer: Self-pay | Admitting: Cardiology

## 2020-07-03 ENCOUNTER — Other Ambulatory Visit: Payer: Self-pay

## 2020-07-03 ENCOUNTER — Ambulatory Visit (INDEPENDENT_AMBULATORY_CARE_PROVIDER_SITE_OTHER): Payer: Medicare Other | Admitting: Family Medicine

## 2020-07-03 ENCOUNTER — Encounter: Payer: Self-pay | Admitting: Family Medicine

## 2020-07-03 VITALS — BP 164/72 | HR 77 | Temp 97.3°F | Resp 18

## 2020-07-03 DIAGNOSIS — I5181 Takotsubo syndrome: Secondary | ICD-10-CM

## 2020-07-03 DIAGNOSIS — Z8673 Personal history of transient ischemic attack (TIA), and cerebral infarction without residual deficits: Secondary | ICD-10-CM | POA: Diagnosis not present

## 2020-07-03 DIAGNOSIS — Z0001 Encounter for general adult medical examination with abnormal findings: Secondary | ICD-10-CM | POA: Diagnosis not present

## 2020-07-03 DIAGNOSIS — Z780 Asymptomatic menopausal state: Secondary | ICD-10-CM

## 2020-07-03 DIAGNOSIS — I10 Essential (primary) hypertension: Secondary | ICD-10-CM | POA: Diagnosis not present

## 2020-07-03 DIAGNOSIS — Z853 Personal history of malignant neoplasm of breast: Secondary | ICD-10-CM

## 2020-07-03 DIAGNOSIS — I4821 Permanent atrial fibrillation: Secondary | ICD-10-CM | POA: Diagnosis not present

## 2020-07-03 DIAGNOSIS — Z23 Encounter for immunization: Secondary | ICD-10-CM

## 2020-07-03 DIAGNOSIS — N321 Vesicointestinal fistula: Secondary | ICD-10-CM

## 2020-07-03 NOTE — Progress Notes (Signed)
Subjective:    Patient ID: [name], female    DOB: [birth year], 85 y.o.   MRN: [id number]  Patient is an 84 y/o WF with PMH significant for Takotsubo cardiomyopathy and nonobstructive CAD, with atrial fibrillation and was admitted in 2020 to repair a colovesical fistula.  Patient also has a history of breast cancer as well as colon cancer.  She had a mammogram performed earlier this year along with a breast ultrasound that were both normal.  Her last colonoscopy was in 2019.  GI did not recommend any repeat colonoscopy.  Due to her age, she does not require a Pap smear however she had a pelvic exam performed by her gynecologist earlier this year.  She is long overdue to have a bone density test.  She has a documented history of osteoporosis.  She is not taking any calcium, vitamin D, or bisphosphonate.  She is on chronic anticoagulation due to atrial fibrillation.  She does report occasional trace bright red blood per rectum streaking on the outside of the stool or on the toilet tissue when she wipes.  She denies any heavy bleeding.  She denies any falls, depression, or memory loss.  She is due for a flu shot.  She is also due for a booster on her Covid vaccination.  She is here for cpe.  Immunization History  Administered Date(s) Administered  . Fluad Quad(high Dose 65+) 06/18/2019  . Influenza Split 07/07/2010  . Influenza, High Dose Seasonal PF 07/14/2018  . Influenza,inj,Quad PF,6+ Mos 07/25/2013, 05/19/2015, 07/08/2016  . Meningococcal Polysaccharide 07/08/2007  . PFIZER SARS-COV-2 Vaccination 10/01/2019, 10/22/2019  . Pneumococcal Conjugate-13 03/14/2014  . Pneumococcal Polysaccharide-23 07/15/2005  . Td 04/06/2010  . Tdap 05/15/2020  . Zoster 02/10/2015     Past Medical History:  Diagnosis Date  . Allergy    rhinitis  . Aortic stenosis    mild by echo 06/2017  . Arthritis   . Bradycardia    a. 10/2017 -> beta blocker cut back due to HR 39.  . Breast cancer ([place]) 01/06/2012  .  Cancer [facility])    right colon and left breast  . Chronic diastolic CHF (congestive heart failure) ([place])   . Colon cancer ([place]) 01/06/2012  . Colovesical fistula    Dr. Marlou Starks and [name] planning surgery 08/2018- surgery revealed spontaneous closure  . COPD (chronic obstructive pulmonary disease) ([place])    pt. denies  . Coronary artery disease 2006   a.  NSTEMI in 2016, cath showed 15% prox-mid RCA, 20% prox LAD, EF 25-35% by cath and 35-40% -> felt due to Takotsubo cardiomyopathy.  . Dilated aortic root ([place])    50mmHg by echo 06/2017  . Diverticulosis   . Dyspnea   . Edema extremities   . GERD (gastroesophageal reflux disease)   . Hernia   . Hiatal hernia    denies  . Hyperlipidemia   . Hypertension   . Mild aortic stenosis    echo 11/2015 but not noted on echo 06/2016  . Osteopenia   . Permanent atrial fibrillation (HCC)    chronic atrial fibrillation  . Pneumonia    hx child  . Pulmonary HTN ([place])    a. moderate to severe PASP 51mmHg echo 11/2015 - now 66mmHg by echo 06/2017. CTA chest in 11/16 with no PE. PFTs in 7/15 with mild obstructive lung disease. She had a negative sleep study in 2017. b. Felt due to left sided HF.  Marland Kitchen Stroke ([place])   .  Takotsubo syndrome 07/29/2015   a. EF 35-40% by echo; akinesis of mid-apical anteroseptal and apical myocardium.  EF now normalized on echo 11/2015   Past Surgical History:  Procedure Laterality Date  . APPENDECTOMY    . BREAST SURGERY     lumpectomy left  . CARDIAC CATHETERIZATION    . CARDIAC CATHETERIZATION N/A 07/28/2015   Procedure: Left Heart Cath and Coronary Angiography;  Surgeon: Peter M Martinique, MD;  Location: [facility];  Service: Cardiovascular;  Laterality: N/A;  . CHOLECYSTECTOMY    . COLECTOMY     right side  . CYSTOSCOPY WITH STENT PLACEMENT Left 09/04/2018   Procedure: CYSTOSCOPY WITH LEFT STENT PLACEMENT, BLADDER REPAIR, CYSTOSCOPY WITH LEFT STENT REMOVAL;  Surgeon: Raynelle Bring, MD;  [facility];   Service: Urology;  Laterality: Left;  . EXCISION OF ACCESSORY NIPPLE Bilateral 05/30/2013   Procedure: BILATERAL NIPPLE BIOPSY;  Surgeon: Merrie Roof, MD;  Location: [facility];  Service: General;  Laterality: Bilateral;  . EYE SURGERY Bilateral 12   cataracts  . HYSTEROSCOPY WITH D & C N/A 05/29/2020   Procedure: DILATATION AND CURETTAGE /HYSTEROSCOPY;  Surgeon: Homero Fellers, MD;  Location: [facility];  Service: Gynecology;  Laterality: N/A;  . IR RADIOLOGIST EVAL & MGMT  07/26/2017  . LAPAROSCOPIC RIGHT COLECTOMY N/A 09/04/2018   Procedure: LAPAROSCOPIC ASSISTED SIGMOID COLECTOMY WITH REPAIR OF FISTULA TO BLADDER;  Surgeon: Jovita Kussmaul, MD;  [facility];  Service: General;  Laterality: N/A;  . SPLIT NIGHT STUDY  02/02/2016       Current Outpatient Medications on File Prior to Visit  Medication Sig Dispense Refill  . acetaminophen (TYLENOL) 500 MG tablet Take 500-1,000 mg by mouth every 6 (six) hours as needed for headache (pain).    Marland Kitchen amLODipine (NORVASC) 5 MG tablet TAKE 1 & 1/2 (ONE & ONE-HALF) TABLETS BY MOUTH ONCE DAILY (Patient taking differently: 7.5 mg daily. ) 135 tablet 2  . clonazePAM (KLONOPIN) 0.5 MG tablet Take 1 tablet by mouth twice daily as needed for anxiety (Patient taking differently: Take 0.5 mg by mouth at bedtime. ) 60 tablet 3  . cloNIDine (CATAPRES) 0.1 MG tablet Take 1 tablet by mouth twice daily (Patient taking differently: Take 0.1 mg by mouth 2 (two) times daily. ) 180 tablet 3  . clotrimazole-betamethasone (LOTRISONE) cream Apply 1 application topically 2 (two) times daily. (Patient taking differently: Apply 1 application topically daily as needed (rash). ) 30 g 0  . ELIQUIS 5 MG TABS tablet Take 1 tablet by mouth twice daily (Patient taking differently: Take 5 mg by mouth 2 (two) times daily. ) 180 tablet 1  . famotidine (PEPCID) 20 MG tablet One at bedtime (Patient taking differently: Take 20 mg by mouth daily as needed for heartburn or indigestion. )  30 tablet 11  . losartan (COZAAR) 100 MG tablet Take 1 tablet by mouth once daily (Patient taking differently: Take 100 mg by mouth daily. ) 90 tablet 3  . mupirocin ointment (BACTROBAN) 2 % Apply topically 2 (two) times daily.    . nitroGLYCERIN (NITROSTAT) 0.4 MG SL tablet DISSOLVE ONE TABLET UNDER THE TONGUE EVERY 5 MINUTES AS NEEDED FOR CHEST PAIN.  DO NOT EXCEED A TOTAL OF 3 DOSES IN 15 MINUTES (Patient taking differently: Place 0.4 mg under the tongue every 5 (five) minutes as needed for chest pain. ) 25 tablet 0  . torsemide (DEMADEX) 20 MG tablet Take 3 tablets by mouth once daily (Patient taking differently:  Take 40 mg by mouth daily. ) 90 tablet 10   No current facility-administered medications on file prior to visit.   Allergies  Allergen Reactions  . Contrast Media [Iodinated Diagnostic Agents] Hives and Other (See Comments)    Per pt strong burning sensation starting in chest radiating outward   . Clonidine Derivatives Other (See Comments)    Throat dry  . Statins Other (See Comments)    "bones hurt"  . Sulfa Antibiotics Diarrhea    Tremors   . Celebrex [Celecoxib] Rash  . Isosorbide Nitrate Itching and Rash  . Other Itching and Rash    Plastic and paper tape and heart monitor pads  . Tape Itching and Rash    Red Where applied and will spread   Social History   Socioeconomic History  . Marital status: Married    Spouse name: Not on file  . Number of children: Not on file  . Years of education: Not on file  . Highest education level: Not on file  Occupational History  . Not on file  Tobacco Use  . Smoking status: Never Smoker  . Smokeless tobacco: Never Used  Vaping Use  . Vaping Use: Never used  Substance and Sexual Activity  . Alcohol use: No  . Drug use: No  . Sexual activity: Not Currently  Other Topics Concern  . Not on file  Social History Narrative  . Not on file   Social Determinants of Health   Financial Resource Strain:   . Difficulty of  Paying Living Expenses: Not on file  Food Insecurity:   . Worried About Charity fundraiser in the Last Year: Not on file  . Ran Out of Food in the Last Year: Not on file  Transportation Needs:   . Lack of Transportation (Medical): Not on file  . Lack of Transportation (Non-Medical): Not on file  Physical Activity:   . Days of Exercise per Week: Not on file  . Minutes of Exercise per Session: Not on file  Stress:   . Feeling of Stress : Not on file  Social Connections:   . Frequency of Communication with Friends and Family: Not on file  . Frequency of Social Gatherings with Friends and Family: Not on file  . Attends Religious Services: Not on file  . Active Member of Clubs or Organizations: Not on file  . Attends Archivist Meetings: Not on file  . Marital Status: Not on file  Intimate Partner Violence:   . Fear of Current or Ex-Partner: Not on file  . Emotionally Abused: Not on file  . Physically Abused: Not on file  . Sexually Abused: Not on file      Review of Systems  Musculoskeletal: Positive for back pain.  All other systems reviewed and are negative.      Objective:   Physical Exam Vitals reviewed.  Constitutional:      General: She is not in acute distress.    Appearance: Normal appearance. She is well-developed and normal weight. She is not diaphoretic.  HENT:     Head: Normocephalic and atraumatic.     Right Ear: Tympanic membrane and ear canal normal.     Left Ear: Tympanic membrane and ear canal normal.     Nose: Nose normal. No congestion or rhinorrhea.     Mouth/Throat:     Mouth: Mucous membranes are moist.     Pharynx: No oropharyngeal exudate or posterior oropharyngeal erythema.  Eyes:  General:        Right eye: No discharge.        Left eye: No discharge.     Extraocular Movements: Extraocular movements intact.     Conjunctiva/sclera: Conjunctivae normal.     Pupils: Pupils are equal, round, and reactive to light.  Neck:      Thyroid: No thyromegaly.     Vascular: No carotid bruit or JVD.  Cardiovascular:     Rate and Rhythm: Normal rate and regular rhythm.     Heart sounds: Murmur heard.  No friction rub. No gallop.   Pulmonary:     Effort: Pulmonary effort is normal. No respiratory distress.     Breath sounds: Normal breath sounds. No stridor. No wheezing, rhonchi or rales.  Chest:     Chest wall: No tenderness.  Abdominal:     General: Bowel sounds are normal. There is no distension.     Palpations: Abdomen is not rigid.     Tenderness: There is no abdominal tenderness. There is no guarding or rebound.     Hernia: No hernia is present.  Musculoskeletal:     Cervical back: Neck supple. No rigidity or tenderness.     Right lower leg: Edema present.     Left lower leg: Edema present.  Lymphadenopathy:     Cervical: No cervical adenopathy.  Skin:    General: Skin is warm.     Findings: No bruising, erythema, lesion or rash.  Neurological:     General: No focal deficit present.     Mental Status: She is alert and oriented to person, place, and time. Mental status is at baseline.     Cranial Nerves: No cranial nerve deficit.     Motor: No weakness or abnormal muscle tone.     Coordination: Coordination normal.     Gait: Gait normal.     Deep Tendon Reflexes: Reflexes normal.  Psychiatric:        Mood and Affect: Mood normal.        Behavior: Behavior normal.        Thought Content: Thought content normal.        Judgment: Judgment normal.           Assessment & Plan:  Permanent atrial fibrillation ([place]) - Plan: CBC with Differential/Platelet, COMPLETE METABOLIC PANEL WITH GFR, Lipid panel  Takotsubo syndrome - Plan: CBC with Differential/Platelet, COMPLETE METABOLIC PANEL WITH GFR, Lipid panel  Benign essential HTN - Plan: CBC with Differential/Platelet, COMPLETE METABOLIC PANEL WITH GFR, Lipid panel  Colovesical fistula  History of CVA (cerebrovascular accident) - Plan: CBC with  Differential/Platelet, COMPLETE METABOLIC PANEL WITH GFR, Lipid panel  History of breast cancer  Postmenopausal estrogen deficiency - Plan: DG Bone Density  Need for immunization against influenza - Plan: Flu Vaccine QUAD High Dose(Fluad)  Patient is currently in normal sinus rhythm today.  Therefore the patient appears to have paroxysmal atrial fibrillation.  She is still appropriately anticoagulated on Eliquis.  I will check a CBC as she is also demonstrated anemia over the last year.  I suspect that some of this is due to rectal bleeding.  She had a colonoscopy that was done 2 years ago that was normal.  Therefore I suspect that the bleeding is likely due to hemorrhoids.  As long as the hemoglobin remained stable I do not feel that she requires a repeat colonoscopy at the present time.  I will check a CBC, CMP, fasting lipid panel.  Today she  appears euvolemic.  Therefore I believe that her dose of torsemide and spironolactone is appropriate.  Mammogram is up-to-date.  I will schedule the patient for a bone density test and I recommended 1200 mg a day of calcium and 1000 units a day of vitamin D.  She denies any falls, depression, or memory loss.

## 2020-07-04 ENCOUNTER — Other Ambulatory Visit: Payer: Self-pay

## 2020-07-04 DIAGNOSIS — D649 Anemia, unspecified: Secondary | ICD-10-CM

## 2020-07-04 LAB — LIPID PANEL
Cholesterol: 162 mg/dL (ref <200)
HDL: 71 mg/dL (ref >=50)
LDL Cholesterol (Calc): 79 mg/dL (calc)
Non-HDL Cholesterol (Calc): 91 mg/dL (calc) (ref <130)
Total CHOL/HDL Ratio: 2.3 (calc) (ref <5.0)
Triglycerides: 43 mg/dL (ref <150)

## 2020-07-04 LAB — COMPLETE METABOLIC PANEL WITH GFR
AG Ratio: 1.3 (calc) (ref 1.0–2.5)
ALT: 10 U/L (ref 6–29)
AST: 17 U/L (ref 10–35)
Albumin: 4.1 g/dL (ref 3.6–5.1)
Alkaline phosphatase (APISO): 78 U/L (ref 37–153)
BUN/Creatinine Ratio: 35 (calc) — ABNORMAL HIGH (ref 6–22)
BUN: 35 mg/dL — ABNORMAL HIGH (ref 7–25)
CO2: 26 mmol/L (ref 20–32)
Calcium: 9.6 mg/dL (ref 8.6–10.4)
Chloride: 100 mmol/L (ref 98–110)
Creat: 1.01 mg/dL — ABNORMAL HIGH (ref 0.60–0.88)
GFR, Est African American: 60 mL/min/{1.73_m2} (ref >=60)
GFR, Est Non African American: 51 mL/min/{1.73_m2} — ABNORMAL LOW (ref >=60)
Globulin: 3.1 g/dL (calc) (ref 1.9–3.7)
Glucose, Bld: 95 mg/dL (ref 65–99)
Potassium: 4.5 mmol/L (ref 3.5–5.3)
Sodium: 135 mmol/L (ref 135–146)
Total Bilirubin: 0.5 mg/dL (ref 0.2–1.2)
Total Protein: 7.2 g/dL (ref 6.1–8.1)

## 2020-07-04 LAB — CBC WITH DIFFERENTIAL/PLATELET
Absolute Monocytes: 625 cells/uL (ref 200–950)
Basophils Absolute: 20 cells/uL (ref 0–200)
Basophils Relative: 0.4 %
Eosinophils Absolute: 80 cells/uL (ref 15–500)
Eosinophils Relative: 1.6 %
HCT: 32.1 % — ABNORMAL LOW (ref 35.0–45.0)
Hemoglobin: 10.5 g/dL — ABNORMAL LOW (ref 11.7–15.5)
Lymphs Abs: 1550 cells/uL (ref 850–3900)
MCH: 29.7 pg (ref 27.0–33.0)
MCHC: 32.7 g/dL (ref 32.0–36.0)
MCV: 90.7 fL (ref 80.0–100.0)
MPV: 10 fL (ref 7.5–12.5)
Monocytes Relative: 12.5 %
Neutro Abs: 2725 cells/uL (ref 1500–7800)
Neutrophils Relative %: 54.5 %
Platelets: 255 10*3/uL (ref 140–400)
RBC: 3.54 10*6/uL — ABNORMAL LOW (ref 3.80–5.10)
RDW: 12.2 % (ref 11.0–15.0)
Total Lymphocyte: 31 %
WBC: 5 10*3/uL (ref 3.8–10.8)

## 2020-07-04 MED ORDER — FERROUS SULFATE 325 (65 FE) MG PO TABS: 325.0000 mg | ORAL_TABLET | Freq: Every day | ORAL | 3 refills | Status: DC

## 2020-07-07 ENCOUNTER — Telehealth: Payer: Self-pay | Admitting: Family Medicine

## 2020-07-07 NOTE — Telephone Encounter (Signed)
Recommend coricidan hbp.

## 2020-07-07 NOTE — Telephone Encounter (Signed)
Can Dr.[name] send over some medication for her coughing up phlegm also running  [place] to the pharmacy.    Clarence Sl[place], [place]

## 2020-07-07 NOTE — Telephone Encounter (Signed)
Please Advise

## 2020-07-10 NOTE — Telephone Encounter (Signed)
Spoke with pt regarding recommendation for cold symptoms

## 2020-07-16 DIAGNOSIS — M1711 Unilateral primary osteoarthritis, right knee: Secondary | ICD-10-CM | POA: Diagnosis not present

## 2020-07-16 DIAGNOSIS — M13861 Other specified arthritis, right knee: Secondary | ICD-10-CM | POA: Diagnosis not present

## 2020-07-18 DIAGNOSIS — D2261 Melanocytic nevi of right upper limb, including shoulder: Secondary | ICD-10-CM | POA: Diagnosis not present

## 2020-07-18 DIAGNOSIS — D2262 Melanocytic nevi of left upper limb, including shoulder: Secondary | ICD-10-CM | POA: Diagnosis not present

## 2020-07-18 DIAGNOSIS — D225 Melanocytic nevi of trunk: Secondary | ICD-10-CM | POA: Diagnosis not present

## 2020-07-18 DIAGNOSIS — D2271 Melanocytic nevi of right lower limb, including hip: Secondary | ICD-10-CM | POA: Diagnosis not present

## 2020-08-05 ENCOUNTER — Other Ambulatory Visit: Payer: Self-pay | Admitting: Cardiology

## 2020-08-08 DIAGNOSIS — D225 Melanocytic nevi of trunk: Secondary | ICD-10-CM | POA: Diagnosis not present

## 2020-08-18 ENCOUNTER — Other Ambulatory Visit (HOSPITAL_COMMUNITY): Payer: Medicare Other

## 2020-08-19 ENCOUNTER — Ambulatory Visit (HOSPITAL_COMMUNITY): Payer: Medicare Other | Attending: Cardiology

## 2020-08-19 ENCOUNTER — Other Ambulatory Visit: Payer: Self-pay

## 2020-08-19 ENCOUNTER — Encounter (INDEPENDENT_AMBULATORY_CARE_PROVIDER_SITE_OTHER): Payer: Self-pay

## 2020-08-19 DIAGNOSIS — I272 Pulmonary hypertension, unspecified: Secondary | ICD-10-CM | POA: Insufficient documentation

## 2020-08-19 LAB — ECHOCARDIOGRAM COMPLETE
AR max vel: 1.95 cm2
AV Area VTI: 1.95 cm2
AV Area mean vel: 1.81 cm2
AV Mean grad: 13 mmHg
AV Peak grad: 24.6 mmHg
Ao pk vel: 2.48 m/s
S' Lateral: 3.4 cm

## 2020-08-25 ENCOUNTER — Telehealth: Payer: Self-pay

## 2020-08-25 DIAGNOSIS — I272 Pulmonary hypertension, unspecified: Secondary | ICD-10-CM

## 2020-08-25 NOTE — Telephone Encounter (Signed)
The patient has been notified of the result and verbalized understanding.  All questions (if any) were answered. Antonieta Iba, RN 08/25/2020 3:33 PM  Referral has been placed.

## 2020-09-10 ENCOUNTER — Other Ambulatory Visit: Payer: Self-pay | Admitting: Cardiology

## 2020-09-15 DIAGNOSIS — H02052 Trichiasis without entropian right lower eyelid: Secondary | ICD-10-CM | POA: Diagnosis not present

## 2020-09-25 ENCOUNTER — Telehealth: Payer: Self-pay | Admitting: Family Medicine

## 2020-09-25 NOTE — Telephone Encounter (Signed)
Insurance will no longer pay for Ferrous Sulfate 325 65 FE pt said that [employer] will pay there number is # [phone] pt wasnt sure the fax #

## 2020-09-25 NOTE — Telephone Encounter (Signed)
Medication is over the counter.

## 2020-09-30 ENCOUNTER — Other Ambulatory Visit: Payer: Self-pay

## 2020-09-30 ENCOUNTER — Ambulatory Visit (INDEPENDENT_AMBULATORY_CARE_PROVIDER_SITE_OTHER): Payer: Medicare Other | Admitting: Family Medicine

## 2020-09-30 ENCOUNTER — Encounter: Payer: Self-pay | Admitting: Family Medicine

## 2020-09-30 VITALS — BP 130/58 | HR 58 | Temp 97.6°F | Ht 65.0 in | Wt 182.0 lb

## 2020-09-30 DIAGNOSIS — K5900 Constipation, unspecified: Secondary | ICD-10-CM | POA: Diagnosis not present

## 2020-09-30 MED ORDER — SILVER SULFADIAZINE 1 % EX CREA: 1.0000 "application " | TOPICAL_CREAM | Freq: Every day | CUTANEOUS | 0 refills | Status: DC
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
## 2020-10-01 ENCOUNTER — Other Ambulatory Visit: Payer: Self-pay | Admitting: Cardiology

## 2020-10-02 NOTE — Telephone Encounter (Signed)
Eliquis 5mg  refill request received. Patient is 86 years old, weight-82.6kg, Crea-1.01 on 07/03/2020, Diagnosis-Afib, and last seen by Dr. [name] on 04/17/2020. Dose is appropriate based on dosing criteria. Will send in refill to requested pharmacy.

## 2020-10-06 ENCOUNTER — Ambulatory Visit (INDEPENDENT_AMBULATORY_CARE_PROVIDER_SITE_OTHER): Payer: Medicare Other | Admitting: Family Medicine

## 2020-10-06 ENCOUNTER — Encounter: Payer: Self-pay | Admitting: Family Medicine

## 2020-10-06 ENCOUNTER — Other Ambulatory Visit: Payer: Self-pay

## 2020-10-06 VITALS — BP 138/74 | HR 60 | Temp 98.1°F | Ht 65.0 in

## 2020-10-06 DIAGNOSIS — L03113 Cellulitis of right upper limb: Secondary | ICD-10-CM

## 2020-10-06 MED ORDER — NITROGLYCERIN 0.4 MG SL SUBL: 0.4000 mg | SUBLINGUAL_TABLET | SUBLINGUAL | 1 refills | Status: DC | PRN

## 2020-10-06 MED ORDER — CEPHALEXIN 500 MG PO CAPS: 500.0000 mg | ORAL_CAPSULE | Freq: Three times a day (TID) | ORAL | 0 refills | Status: DC
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
## 2020-10-08 ENCOUNTER — Other Ambulatory Visit: Payer: Self-pay

## 2020-10-08 ENCOUNTER — Ambulatory Visit (INDEPENDENT_AMBULATORY_CARE_PROVIDER_SITE_OTHER): Payer: Medicare Other | Admitting: Nurse Practitioner

## 2020-10-08 VITALS — BP 154/54 | HR 72 | Temp 97.2°F | Ht 65.0 in | Wt 186.2 lb

## 2020-10-08 DIAGNOSIS — L03113 Cellulitis of right upper limb: Secondary | ICD-10-CM | POA: Diagnosis not present
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
## 2020-10-08 NOTE — Patient Instructions (Signed)

## 2020-10-09 ENCOUNTER — Ambulatory Visit (INDEPENDENT_AMBULATORY_CARE_PROVIDER_SITE_OTHER): Payer: Medicare Other | Admitting: Nurse Practitioner

## 2020-10-09 VITALS — BP 164/80 | HR 70 | Temp 97.7°F

## 2020-10-09 DIAGNOSIS — L03113 Cellulitis of right upper limb: Secondary | ICD-10-CM

## 2020-10-09 MED ORDER — DOXYCYCLINE HYCLATE 100 MG PO TABS: 100.0000 mg | ORAL_TABLET | Freq: Two times a day (BID) | ORAL | 0 refills | Status: DC
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
## 2020-10-10 ENCOUNTER — Ambulatory Visit (INDEPENDENT_AMBULATORY_CARE_PROVIDER_SITE_OTHER): Payer: Medicare Other | Admitting: Family Medicine

## 2020-10-10 ENCOUNTER — Other Ambulatory Visit: Payer: Self-pay

## 2020-10-10 VITALS — BP 174/64 | HR 60 | Temp 97.7°F

## 2020-10-10 DIAGNOSIS — L03113 Cellulitis of right upper limb: Secondary | ICD-10-CM | POA: Diagnosis not present
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
## 2020-10-14 ENCOUNTER — Ambulatory Visit: Payer: Medicare Other

## 2020-10-14 ENCOUNTER — Ambulatory Visit: Payer: Medicare Other | Admitting: Cardiology

## 2020-10-14 ENCOUNTER — Other Ambulatory Visit: Payer: Self-pay

## 2020-10-20 ENCOUNTER — Other Ambulatory Visit: Payer: Self-pay

## 2020-10-20 ENCOUNTER — Encounter: Payer: Self-pay | Admitting: Family Medicine

## 2020-10-20 ENCOUNTER — Ambulatory Visit (INDEPENDENT_AMBULATORY_CARE_PROVIDER_SITE_OTHER): Payer: Medicare Other | Admitting: Family Medicine

## 2020-10-20 VITALS — BP 146/50 | HR 64 | Temp 98.2°F | Resp 18

## 2020-10-20 DIAGNOSIS — L03113 Cellulitis of right upper limb: Secondary | ICD-10-CM | POA: Diagnosis not present

## 2020-10-20 DIAGNOSIS — T22211D Burn of second degree of right forearm, subsequent encounter: Secondary | ICD-10-CM | POA: Diagnosis not present

## 2020-10-20 MED ORDER — HYDROCODONE-ACETAMINOPHEN 5-325 MG PO TABS
1.0000 | ORAL_TABLET | Freq: Four times a day (QID) | ORAL | 0 refills | Status: DC | PRN
Start: 2020-10-20 — End: 2020-11-04

## 2020-10-20 NOTE — Patient Instructions (Addendum)
[facility] Wednesday at 12:30pm  Pain medication sent in
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
## 2020-10-22 DIAGNOSIS — L233 Allergic contact dermatitis due to drugs in contact with skin: Secondary | ICD-10-CM | POA: Diagnosis not present

## 2020-10-31 DIAGNOSIS — L233 Allergic contact dermatitis due to drugs in contact with skin: Secondary | ICD-10-CM | POA: Diagnosis not present

## 2020-11-04 ENCOUNTER — Other Ambulatory Visit: Payer: Self-pay

## 2020-11-04 ENCOUNTER — Encounter: Payer: Self-pay | Admitting: Cardiology

## 2020-11-04 ENCOUNTER — Ambulatory Visit: Payer: Medicare Other | Admitting: Cardiology

## 2020-11-04 VITALS — BP 150/52 | HR 58 | Ht 66.0 in | Wt 185.4 lb

## 2020-11-04 DIAGNOSIS — I5181 Takotsubo syndrome: Secondary | ICD-10-CM | POA: Diagnosis not present

## 2020-11-04 DIAGNOSIS — E785 Hyperlipidemia, unspecified: Secondary | ICD-10-CM

## 2020-11-04 DIAGNOSIS — I350 Nonrheumatic aortic (valve) stenosis: Secondary | ICD-10-CM

## 2020-11-04 DIAGNOSIS — I10 Essential (primary) hypertension: Secondary | ICD-10-CM

## 2020-11-04 DIAGNOSIS — R001 Bradycardia, unspecified: Secondary | ICD-10-CM

## 2020-11-04 DIAGNOSIS — I5032 Chronic diastolic (congestive) heart failure: Secondary | ICD-10-CM

## 2020-11-04 DIAGNOSIS — I2510 Atherosclerotic heart disease of native coronary artery without angina pectoris: Secondary | ICD-10-CM | POA: Diagnosis not present

## 2020-11-04 DIAGNOSIS — I4821 Permanent atrial fibrillation: Secondary | ICD-10-CM

## 2020-11-04 DIAGNOSIS — I272 Pulmonary hypertension, unspecified: Secondary | ICD-10-CM | POA: Diagnosis not present

## 2020-11-04 MED ORDER — SPIRONOLACTONE 25 MG PO TABS: 25.0000 mg | ORAL_TABLET | Freq: Every day | ORAL | 3 refills | Status: DC

## 2020-11-04 NOTE — Patient Instructions (Signed)
Medication Instructions:  Your physician has recommended you make the following change in your medication: \ 1) START taking spironolactone 25 mg daily  *If you need a refill on your cardiac medications before your next appointment, please call your pharmacy*   Lab Work: [facility] on 3/9 between 7:30am and 4:30pm  If you have labs (blood work) drawn today and your tests are completely normal, you will receive your results only by: Marland Kitchen MyChart Message (if you have MyChart) OR . A paper copy in the mail If you have any lab test that is abnormal or we need to change your treatment, we will call you to review the results.  Follow-Up: At [facility], you and your health needs are our priority.  As part of our continuing mission to provide you with exceptional heart care, we have created designated Provider Care Teams.  These Care Teams include your primary Cardiologist (physician) and Advanced Practice Providers (APPs -  Physician Assistants and Nurse Practitioners) who all work together to provide you with the care you need, when you need it.  Your next appointment:   6 month(s)  The format for your next appointment:   In Person  Provider:   You Espinoza see Fransico Him, MD or one of the following Advanced Practice Providers on your designated Care Team:    Melina Copa, PA-C  Ermalinda Barrios, PA-C

## 2020-11-04 NOTE — Progress Notes (Signed)
Cardiology Office Note:    Date:  11/04/2020   ID:  [name], DOB [birth year], MRN [id number]  PCP:  [name], MD  Cardiologist:  Fransico Him, MD    Referring MD: [name], MD   Chief Complaint  Patient presents with  . Cardiomyopathy  . Hypertension  . Coronary Artery Disease  . Atrial Fibrillation  . Hyperlipidemia  . Congestive Heart Failure  . Aortic Stenosis    History of Present Illness:    [name] is a 86 y.o. female with a hx of Takotsubo cardiomyopathy EF 35 to 40% in 2016 with recovered EF, nonobstructive CAD on cath in 2016, hypertension, chronic diastolic CHF, pulmonary hypertension, mild aortic stenosis, permanent atrial fibrillation on Eliquis.  History of bradycardia 02/2019 at which time metoprolol was discontinued  Discharge from the hospital 07/24/2019 her observation for chest pain felt to be atypical.  Troponins negative, EKG unchanged.  Lexiscan Myoview 07/24/2019 no reversible ischemia likely breast attenuation artifact EF 70%.  She was seen by Estella Husk, PA in Dec 2020 due to elevated BP at PCP office. She had an echo that same day which showed normal LVF with mild LVH, moderate biatrial enlargement and mild MR, mild AS and pulmonary HTN which had improved from prior echo and BP was controlled at this OV.    She is here today for followup and is doing well.  She says that she has some SOB which is stable and mainly occurs when she gets up and the house is cold and then she feels SOB but she will turn up the heat and go back to bed until the house is warm and then her breathing is better.  She does have some DOE with walking which is stable. She has chronic LE edema stable on diuretics. She denies any chest pain or pressure, PND, orthopnea, palpitations or syncope. She is compliant with her meds and is tolerating meds with no SE.    Past Medical History:  Diagnosis Date  . Allergy    rhinitis  . Aortic stenosis    mild by echo  06/2017  . Arthritis   . Bradycardia    a. 10/2017 -> beta blocker cut back due to HR 39.  . Breast cancer ([place]) 01/06/2012  . Cancer [facility])    right colon and left breast  . Chronic diastolic CHF (congestive heart failure) ([place])   . Colon cancer ([place]) 01/06/2012  . Colovesical fistula    Dr. Marlou Starks and [name] planning surgery 08/2018- surgery revealed spontaneous closure  . COPD (chronic obstructive pulmonary disease) ([place])    pt. denies  . Coronary artery disease 2006   a.  NSTEMI in 2016, cath showed 15% prox-mid RCA, 20% prox LAD, EF 25-35% by cath and 35-40% -> felt due to Takotsubo cardiomyopathy.  . Dilated aortic root ([place])    45mmHg by echo 06/2017  . Diverticulosis   . Dyspnea   . Edema extremities   . GERD (gastroesophageal reflux disease)   . Hernia   . Hiatal hernia    denies  . Hyperlipidemia   . Hypertension   . Mild aortic stenosis    echo 11/2015 but not noted on echo 06/2016  . Osteopenia   . Permanent atrial fibrillation (HCC)    chronic atrial fibrillation  . Pneumonia    hx child  . Pulmonary HTN ([place])    a. moderate to severe PASP 39mmHg echo 11/2015 - now 71mmHg by echo  06/2017. CTA chest in 11/16 with no PE. PFTs in 7/15 with mild obstructive lung disease. She had a negative sleep study in 2017. b. Felt due to left sided HF.  Marland Kitchen Stroke ([place])   . Takotsubo syndrome 07/29/2015   a. EF 35-40% by echo; akinesis of mid-apical anteroseptal and apical myocardium.  EF now normalized on echo 11/2015    Past Surgical History:  Procedure Laterality Date  . APPENDECTOMY    . BREAST SURGERY     lumpectomy left  . CARDIAC CATHETERIZATION    . CARDIAC CATHETERIZATION N/A 07/28/2015   Procedure: Left Heart Cath and Coronary Angiography;  Surgeon: Peter M Martinique, MD;  Location: [facility];  Service: Cardiovascular;  Laterality: N/A;  . CHOLECYSTECTOMY    . COLECTOMY     right side  . CYSTOSCOPY WITH STENT PLACEMENT Left 09/04/2018   Procedure: CYSTOSCOPY  WITH LEFT STENT PLACEMENT, BLADDER REPAIR, CYSTOSCOPY WITH LEFT STENT REMOVAL;  Surgeon: Raynelle Bring, MD;  Location: [facility];  Service: Urology;  Laterality: Left;  . EXCISION OF ACCESSORY NIPPLE Bilateral 05/30/2013   Procedure: BILATERAL NIPPLE BIOPSY;  Surgeon: Merrie Roof, MD;  Location: [facility];  Service: General;  Laterality: Bilateral;  . EYE SURGERY Bilateral 12   cataracts  . HYSTEROSCOPY WITH D & C N/A 05/29/2020   Procedure: DILATATION AND CURETTAGE /HYSTEROSCOPY;  Surgeon: Homero Fellers, MD;  Location: [facility];  Service: Gynecology;  Laterality: N/A;  . IR RADIOLOGIST EVAL & MGMT  07/26/2017  . LAPAROSCOPIC RIGHT COLECTOMY N/A 09/04/2018   Procedure: LAPAROSCOPIC ASSISTED SIGMOID COLECTOMY WITH REPAIR OF FISTULA TO BLADDER;  Surgeon: Autumn Messing III, MD;  Location: [facility];  Service: General;  Laterality: N/A;  . SPLIT NIGHT STUDY  02/02/2016        Current Medications: Current Meds  Medication Sig  . acetaminophen (TYLENOL) 500 MG tablet Take 500-1,000 mg by mouth every 6 (six) hours as needed for headache (pain).  Marland Kitchen amLODipine (NORVASC) 5 MG tablet TAKE 1 & 1/2 (ONE & ONE-HALF) TABLETS BY MOUTH ONCE DAILY  . clonazePAM (KLONOPIN) 0.5 MG tablet Take 1 tablet by mouth twice daily as needed for anxiety (Patient taking differently: Take 0.5 mg by mouth at bedtime.)  . cloNIDine (CATAPRES) 0.1 MG tablet Take 1 tablet by mouth twice daily (Patient taking differently: Take 0.1 mg by mouth 2 (two) times daily.)  . clotrimazole-betamethasone (LOTRISONE) cream Apply 1 application topically 2 (two) times daily.  Marland Kitchen ELIQUIS 5 MG TABS tablet Take 1 tablet by mouth twice daily  . famotidine (PEPCID) 20 MG tablet One at bedtime (Patient taking differently: Take 20 mg by mouth daily as needed for heartburn or indigestion.)  . ferrous sulfate 325 (65 FE) MG tablet Take 1 tablet (325 mg total) by mouth daily with breakfast.  . losartan (COZAAR) 100 MG tablet Take 1 tablet by mouth once  daily  . nitroGLYCERIN (NITROSTAT) 0.4 MG SL tablet Place 1 tablet (0.4 mg total) under the tongue every 5 (five) minutes as needed for chest pain.  Marland Kitchen torsemide (DEMADEX) 20 MG tablet Take 3 tablets by mouth once daily     Allergies:   Contrast media [iodinated diagnostic agents], Clonidine derivatives, Statins, Sulfa antibiotics, Celebrex [celecoxib], Isosorbide nitrate, Other, and Tape   Social History   Socioeconomic History  . Marital status: Married    Spouse name: Not on file  . Number of children: Not on file  . Years of education: Not on file  .  Highest education level: Not on file  Occupational History  . Not on file  Tobacco Use  . Smoking status: Never Smoker  . Smokeless tobacco: Never Used  Vaping Use  . Vaping Use: Never used  Substance and Sexual Activity  . Alcohol use: No  . Drug use: No  . Sexual activity: Not Currently  Other Topics Concern  . Not on file  Social History Narrative  . Not on file   Social Determinants of Health   Financial Resource Strain: Not on file  Food Insecurity: Not on file  Transportation Needs: Not on file  Physical Activity: Not on file  Stress: Not on file  Social Connections: Not on file     Family History: The patient's family history includes Cancer in her sister and sister; Heart attack in her mother; Heart disease in her mother; Heart disease (age of onset: 63) in her brother; Hypertension in her father. There is no history of Stroke.  ROS:   Please see the history of present illness.    ROS  All other systems reviewed and negative.   EKGs/Labs/Other Studies Reviewed:    The following studies were reviewed today: Labs,  office notes   2D echo 08/2020 IMPRESSIONS   1. Left ventricular ejection fraction, by estimation, is 65 to 70%. The  left ventricle has normal function. The left ventricle has no regional  wall motion abnormalities. Left ventricular diastolic parameters are  indeterminate.  2. Right  ventricular systolic function is normal. The right ventricular  size is normal. There is severely elevated pulmonary artery systolic  pressure.  3. Left atrial size was mildly dilated.  4. Right atrial size was mildly dilated.  5. The mitral valve is normal in structure. Trivial mitral valve  regurgitation. No evidence of mitral stenosis.  6. Tricuspid valve regurgitation is mild to moderate.  7. The aortic valve is tricuspid. Aortic valve regurgitation is not  visualized. Mild aortic valve stenosis.  8. Aortic dilatation noted. There is mild dilatation of the ascending  aorta, measuring 38 mm.  9. The inferior vena cava is dilated in size with >50% respiratory  variability, suggesting right atrial pressure of 8 mmHg.   Comparison(s): 08/14/19 EF 60-65%. PA pressure 74mmHg. Mild AS 16mmHg mean,  39mmHg peak.   EKG:  EKG is not ordered today.   Recent Labs: 05/23/2020: Brain Natriuretic Peptide 86 07/03/2020: ALT 10; BUN 35; Creat 1.01; Hemoglobin 10.5; Platelets 255; Potassium 4.5; Sodium 135   Recent Lipid Panel    Component Value Date/Time   CHOL 162 07/03/2020 1004   CHOL 149 04/21/2020 0804   TRIG 43 07/03/2020 1004   HDL 71 07/03/2020 1004   HDL 68 04/21/2020 0804   CHOLHDL 2.3 07/03/2020 1004   VLDL 23 03/15/2016 1057   LDLCALC 79 07/03/2020 1004    Physical Exam:    VS:  BP (!) 150/52   Pulse (!) 58   Ht 5\' 6"  (1.676 m)   Wt 185 lb 6.4 oz (84.1 kg)   SpO2 97%   BMI 29.92 kg/m     Wt Readings from Last 3 Encounters:  11/04/20 185 lb 6.4 oz (84.1 kg)  10/08/20 186 lb 3.2 oz (84.5 kg)  09/30/20 182 lb (82.6 kg)    GEN: Well nourished, well developed in no acute distress HEENT: Normal NECK: No JVD; No carotid bruits LYMPHATICS: No lymphadenopathy CARDIAC:RRR, no rubs, gallops.  2/6 SM at RUSB to LLSB RESPIRATORY:  Clear to auscultation without rales, wheezing  or rhonchi  ABDOMEN: Soft, non-tender, non-distended MUSCULOSKELETAL:  No edema; No  deformity  SKIN: Warm and dry NEUROLOGIC:  Alert and oriented x 3 PSYCHIATRIC:  Normal affect    ASSESSMENT:    1. Takotsubo syndrome   2. Pulmonary HTN ([place])   3. Primary hypertension   4. Coronary artery disease involving native coronary artery of native heart without angina pectoris   5. Bradycardia   6. Chronic diastolic CHF (congestive heart failure) ([place])   7. Nonrheumatic aortic valve stenosis   8. Permanent atrial fibrillation ([place])   9. Dyslipidemia, goal LDL below 70    PLAN:    In order of problems listed above:  1.  Takotsubo DCM -resolved with EF 60-65% by echo 08/2018 and 08/2019  2.  Pulmonary HTN -PASP 52mmHg on echo 08/2019 with associated moderate to severe TR -felt secondary to [facility] from diastolic CHF -PASP has been has high as the 60's in 2017 -continue demadex 60mg  daily -2D echo 08/2020 showed severe PHTN with PASP 86mmHg -she was referred to Dr. Aundra Dubin for possible RHC but never go a call about an appt -I will refer her again  3.  HTN -BP is borderline controlled on exam today -continue Losartan 100mg  daily, Clonidine 0.1mg  BID and amlodipine 7.5mg  daily -would avoid increasing amlodipine further due to LE edema -restart Arlyce Harman 25mg  daily>>unclear why PCP stopped this -recheck BMET in 1 week -creatinine 1.01 in Nov 2021  4.  ASCAD -non obstructive by cath 2016 -she has not had any anginal sx but has DOE that is chronic -no ASA due to Eliquis -no BB due to bradycardia -statin intolerant  5. Bradycardia -resolved off BB  6.  Chronic diastolic CHF -she has chronic LE edema likely multifactorial from sedentary state, obesity, CHF and dietary indiscretion with Na -weight is stable -she has difficulty wearing compression hose -DOE is at baseline -continue Torsemide 60mg  daily  7.  Aortic stenosis -mild by echo 08/2019  8.  Permanent atrial fibrillation -Her heart rate is well controlled and she denies any palpitations -no bleeding  problems on DOAC -continue Eliquis 5mg  BID (weight>60kg and SCr < 1.5) -no BB due to bradycardia -SCr 1.01 and Hbg 10.5 in Oct 2021  9.  HLD -LDL goal < 70 -LDL was 79 in Oct 2021 -intolerant to statin   Medication Adjustments/Labs and Tests Ordered: Current medicines are reviewed at length with the patient today.  Concerns regarding medicines are outlined above.  No orders of the defined types were placed in this encounter.  No orders of the defined types were placed in this encounter.   Signed, Fransico Him, MD  11/04/2020 3:21 PM    [facility]

## 2020-11-04 NOTE — Addendum Note (Signed)
Addended by: Antonieta Iba on: 11/04/2020 03:32 PM   Modules accepted: Orders

## 2020-11-11 DIAGNOSIS — L233 Allergic contact dermatitis due to drugs in contact with skin: Secondary | ICD-10-CM | POA: Diagnosis not present

## 2020-11-12 ENCOUNTER — Other Ambulatory Visit: Payer: Medicare Other

## 2020-11-12 ENCOUNTER — Other Ambulatory Visit: Payer: Self-pay

## 2020-11-12 DIAGNOSIS — I272 Pulmonary hypertension, unspecified: Secondary | ICD-10-CM

## 2020-11-12 DIAGNOSIS — I2510 Atherosclerotic heart disease of native coronary artery without angina pectoris: Secondary | ICD-10-CM

## 2020-11-12 DIAGNOSIS — I10 Essential (primary) hypertension: Secondary | ICD-10-CM | POA: Diagnosis not present

## 2020-11-12 LAB — BASIC METABOLIC PANEL
BUN/Creatinine Ratio: 30 — ABNORMAL HIGH (ref 12–28)
BUN: 27 mg/dL (ref 8–27)
CO2: 24 mmol/L (ref 20–29)
Calcium: 10 mg/dL (ref 8.7–10.3)
Chloride: 97 mmol/L (ref 96–106)
Creatinine, Ser: 0.89 mg/dL (ref 0.57–1.00)
Glucose: 93 mg/dL (ref 65–99)
Potassium: 4.9 mmol/L (ref 3.5–5.2)
Sodium: 137 mmol/L (ref 134–144)
eGFR: 64 mL/min/{1.73_m2} (ref >59)

## 2020-11-15 ENCOUNTER — Other Ambulatory Visit: Payer: Self-pay | Admitting: Cardiology

## 2020-11-17 ENCOUNTER — Other Ambulatory Visit: Payer: Self-pay | Admitting: *Deleted

## 2020-11-17 NOTE — Telephone Encounter (Signed)
Patient called in for refill on his Amlodipine 5 mg this refill request is pending.

## 2020-12-05 ENCOUNTER — Other Ambulatory Visit: Payer: Self-pay

## 2020-12-05 ENCOUNTER — Other Ambulatory Visit: Payer: Self-pay | Admitting: Family Medicine

## 2020-12-05 ENCOUNTER — Encounter (HOSPITAL_COMMUNITY): Payer: Self-pay | Admitting: Cardiology

## 2020-12-05 ENCOUNTER — Other Ambulatory Visit (HOSPITAL_COMMUNITY): Payer: Self-pay | Admitting: *Deleted

## 2020-12-05 ENCOUNTER — Ambulatory Visit (HOSPITAL_COMMUNITY)
Admission: RE | Admit: 2020-12-05 | Discharge: 2020-12-05 | Disposition: A | Discharge status: Home / Self Care | Payer: Medicare Other | Source: Ambulatory Visit | Attending: Cardiology | Admitting: Cardiology

## 2020-12-05 VITALS — BP 130/60 | HR 50 | Wt 181.8 lb

## 2020-12-05 DIAGNOSIS — F064 Anxiety disorder due to known physiological condition: Secondary | ICD-10-CM

## 2020-12-05 DIAGNOSIS — I110 Hypertensive heart disease with heart failure: Secondary | ICD-10-CM | POA: Insufficient documentation

## 2020-12-05 DIAGNOSIS — I482 Chronic atrial fibrillation, unspecified: Secondary | ICD-10-CM | POA: Insufficient documentation

## 2020-12-05 DIAGNOSIS — Z79899 Other long term (current) drug therapy: Secondary | ICD-10-CM | POA: Diagnosis not present

## 2020-12-05 DIAGNOSIS — E785 Hyperlipidemia, unspecified: Secondary | ICD-10-CM | POA: Diagnosis not present

## 2020-12-05 DIAGNOSIS — I272 Pulmonary hypertension, unspecified: Secondary | ICD-10-CM

## 2020-12-05 DIAGNOSIS — Z8249 Family history of ischemic heart disease and other diseases of the circulatory system: Secondary | ICD-10-CM | POA: Diagnosis not present

## 2020-12-05 DIAGNOSIS — Z7901 Long term (current) use of anticoagulants: Secondary | ICD-10-CM | POA: Diagnosis not present

## 2020-12-05 DIAGNOSIS — I5032 Chronic diastolic (congestive) heart failure: Secondary | ICD-10-CM | POA: Diagnosis not present

## 2020-12-05 DIAGNOSIS — K5792 Diverticulitis of intestine, part unspecified, without perforation or abscess without bleeding: Secondary | ICD-10-CM

## 2020-12-05 LAB — CBC
HCT: 30.8 % — ABNORMAL LOW (ref 36.0–46.0)
Hemoglobin: 10 g/dL — ABNORMAL LOW (ref 12.0–15.0)
MCH: 30.4 pg (ref 26.0–34.0)
MCHC: 32.5 g/dL (ref 30.0–36.0)
MCV: 93.6 fL (ref 80.0–100.0)
Platelets: 217 10*3/uL (ref 150–400)
RBC: 3.29 MIL/uL — ABNORMAL LOW (ref 3.87–5.11)
RDW: 13.2 % (ref 11.5–15.5)
WBC: 4.2 10*3/uL (ref 4.0–10.5)
nRBC: 0 % (ref 0.0–0.2)

## 2020-12-05 LAB — BASIC METABOLIC PANEL
Anion gap: 7 (ref 5–15)
BUN: 35 mg/dL — ABNORMAL HIGH (ref 8–23)
CO2: 25 mmol/L (ref 22–32)
Calcium: 9.5 mg/dL (ref 8.9–10.3)
Chloride: 94 mmol/L — ABNORMAL LOW (ref 98–111)
Creatinine, Ser: 1.18 mg/dL — ABNORMAL HIGH (ref 0.44–1.00)
GFR, Estimated: 46 mL/min — ABNORMAL LOW (ref >60)
Glucose, Bld: 107 mg/dL — ABNORMAL HIGH (ref 70–99)
Potassium: 4.9 mmol/L (ref 3.5–5.1)
Sodium: 126 mmol/L — ABNORMAL LOW (ref 135–145)

## 2020-12-05 LAB — BRAIN NATRIURETIC PEPTIDE: B Natriuretic Peptide: 111.8 pg/mL — ABNORMAL HIGH (ref 0.0–100.0)

## 2020-12-05 MED ORDER — EMPAGLIFLOZIN 10 MG PO TABS: 10.0000 mg | ORAL_TABLET | Freq: Every day | ORAL | 6 refills | Status: DC

## 2020-12-05 NOTE — Patient Instructions (Signed)
Start Jardiance 10 mg Daily  Labs done today, your results will be available in MyChart, we will contact you for abnormal readings.  Heart Catheterization on Wed 4/6, see instructions below  Your physician recommends that you schedule a follow-up appointment in: 1 month  If you have any questions or concerns before your next appointment please send [place] a message through [place] or call our office at [phone].    TO LEAVE A MESSAGE FOR THE NURSE SELECT OPTION 2, PLEASE LEAVE A MESSAGE INCLUDING: . YOUR NAME . DATE OF BIRTH . CALL BACK NUMBER . REASON FOR CALL**this is important as we prioritize the call backs  [place] AS LONG AS YOU CALL BEFORE 4:00 PM  At the [facility], you and your health needs are our priority. As part of our continuing mission to provide you with exceptional heart care, we have created designated Provider Care Teams. These Care Teams include your primary Cardiologist (physician) and Advanced Practice Providers (APPs- Physician Assistants and Nurse Practitioners) who all work together to provide you with the care you need, when you need it.   You Jennings see any of the following providers on your designated Care Team at your next follow up: Marland Kitchen Dr Glori Bickers . Dr Loralie Champagne . Dr Vickki Muff . Darrick Grinder, NP . [name], [place] . Audry Riles, PharmD   Please be sure to bring in all your medications bottles to every appointment.      HEART CATHETERIZATION INSTRUCTIONS:  You are scheduled for a Cardiac Catheterization on Wednesday, April 6 with Dr. Loralie Champagne.  1. Please arrive at the [facility] (Main Entrance A) at [facility]: [address] [place], [place] [address] at 1:00 PM (This time is two hours before your procedure to ensure your preparation). Free valet parking service is available.   Special note: Every effort is made to have your procedure done on  time. Please understand that emergencies sometimes delay scheduled procedures.  2. Diet: Do not eat solid foods after midnight.  The patient [place] have clear liquids until 5am upon the day of the procedure.  3. COVID TEST: Monday 4/4 AT 12:15 at [facility]  4. Medication instructions in preparation for your procedure:   Tuesday 4/5 DO NOT TAKE ELIQUIS  Wednesday 4/6 AM DO NOT TAKE ELIQUIS, JARDIANCE, TORSEMIDE, OR SPIRONOLACTONE  On the morning of your procedure, take any morning medicines NOT listed above.  You [place] use sips of water.  5. Plan for one night stay--bring personal belongings. 6. Bring a current list of your medications and current insurance cards. 7. You MUST have a responsible person to drive you home. 8. Someone MUST be with you the first 24 hours after you arrive home or your discharge will be delayed. 9. Please wear clothes that are easy to get on and off and wear slip-on shoes.  Thank you for allowing [place] to care for you!   -- [facility]

## 2020-12-06 NOTE — Progress Notes (Signed)
Patient ID: [name], female   DOB: Jul 30, 1935, 86 y.o.   MRN: [id number]    Advanced Heart Failure Clinic Note   PCP: [name], MD Cardiology: [name] HF Cardiology: Aundra Dubin  86 y.o. with history of poorly controlled HTN, chronic atrial fibrillation, chronic diastolic CHF, and prior episode of Takotsubo cardiomyopathy was referred by to HF clinic by Dr. [name]. Patient has had exertional dyspnea since 11/16.  At that time, she presented to the hospital with chest pain.  Coronary angiography showed no significant CAD.  EF was 35-40% by echo, suspected Takotsubo cardiomyopathy.  Her echo in 3/17 showed EF improved to 15-40% but PA systolic pressure elevated at 65 mmHg.  We transitioned her to torsemide.   She has continued to have exertional dyspnea, seems to have worsened over the last few months.  She is short of breath walking about 100 feet or walking up an incline.  Limits her activities but does take care of her husband who as dementia.  No orthopnea/PND.  She gets occasional palpitations (has chronic AF) but does not have chest pain.  Rare lightheadedness. Recent echo in 12/21 showed elevation in PA pressure => EF 65-70%, normal RV, PASP 60 mmHg, mild AS, mild-moderate TR.    Labs (11/16): LDL 80 Labs (4/17): K 4.5, creatinine 1.1, BNP 132 Labs (5/17): LDL 92 Labs (3/18): K 4, creatinine 0.98, LDL 81, proBNP 834 Labs (6/18): K 4.1, creatinine 1.37, hgb 12.5 Labs (10/21): LDL 79, HDL 71  PMH: 1. Hyperlipidemia: Myalgias with atorvastatin, Crestor, simvastatin. Myalgias with Zetia.  2. Chronic atrial fibrillation 3. HTN: Poor control.  4. H/o breast cancer: 2013.  5. GERD 6. Takotsubo cardiomyopathy: Admitted in 11/16 with chest pain.  Coronary angiography showed nonobstructive CAD.  Echo (11/16) showed EF 35-40%.  EF back to normal by 3/17 echo.  7. Chronic diastolic CHF - Echo (0/86) with EF 60-65%, mild aortic stenosis, PA systolic pressure 65 mmHg, RV mildly dilated with  normal systolic function.  - Cardiolite (4/17) with EF 61%, small reversible mid anteroseptal/apical lateral defects thought to be related to shifting breast artifact => low risk study.  - Echo (12/21): EF 65-70%, normal RV, PASP 60 mmHg, mild AS, mild-moderate TR.   8. Ascending aortic aneurysm: 4.3 cm by CTA in 11/16.  9. Pulmonary hypertension: PASP 65 mmHg by echo in 3/17.  CTA chest in 11/16 with no PE.  PFTs in 7/15 with mild obstructive lung disease.  10. Sleep study negative 2017. 11. Bradycardia: off nodal blockers.  - Holter (8/20): Average HR 51, afib 12. Aortic stenosis: Mild.   Social History   Socioeconomic History  . Marital status: Married    Spouse name: Not on file  . Number of children: Not on file  . Years of education: Not on file  . Highest education level: Not on file  Occupational History  . Not on file  Tobacco Use  . Smoking status: Never Smoker  . Smokeless tobacco: Never Used  Vaping Use  . Vaping Use: Never used  Substance and Sexual Activity  . Alcohol use: No  . Drug use: No  . Sexual activity: Not Currently  Other Topics Concern  . Not on file  Social History Narrative  . Not on file   Social Determinants of Health   Financial Resource Strain: Not on file  Food Insecurity: Not on file  Transportation Needs: Not on file  Physical Activity: Not on file  Stress: Not on file  Social  Connections: Not on file  Intimate Partner Violence: Not on file   Family History  Problem Relation Age of Onset  . Heart disease Mother   . Heart attack Mother   . Cancer Sister        stomach and colon  . Heart disease Brother 29  . Hypertension Father   . Cancer Sister   . Stroke Neg Hx    ROS: All systems reviewed and negative except as per HPI.   Current Outpatient Medications  Medication Sig Dispense Refill  . acetaminophen (TYLENOL) 500 MG tablet Take 500-1,000 mg by mouth every 6 (six) hours as needed for headache (pain).    Marland Kitchen amLODipine  (NORVASC) 5 MG tablet TAKE 1 & 1/2 (ONE & ONE-HALF) TABLETS BY MOUTH ONCE DAILY (Patient taking differently: Take 7.5 mg by mouth daily.) 135 tablet 3  . Ascorbic Acid (VITAMIN C) 1000 MG tablet Take 1,000 mg by mouth daily.    . Cholecalciferol (VITAMIN D3) 25 MCG (1000 UT) CAPS Take 1,000 Units by mouth daily.    . cloNIDine (CATAPRES) 0.1 MG tablet Take 1 tablet by mouth twice daily (Patient taking differently: Take 0.1 mg by mouth 2 (two) times daily.) 180 tablet 3  . clotrimazole-betamethasone (LOTRISONE) cream Apply 1 application topically 2 (two) times daily. 30 g 0  . ELIQUIS 5 MG TABS tablet Take 1 tablet by mouth twice daily (Patient taking differently: Take 5 mg by mouth 2 (two) times daily.) 180 tablet 1  . empagliflozin (JARDIANCE) 10 MG TABS tablet Take 1 tablet (10 mg total) by mouth daily before breakfast. 30 tablet 6  . famotidine (PEPCID) 20 MG tablet Take 20 mg by mouth at bedtime.    . ferrous sulfate 325 (65 FE) MG tablet Take 1 tablet (325 mg total) by mouth daily with breakfast. 90 tablet 3  . losartan (COZAAR) 100 MG tablet Take 1 tablet by mouth once daily (Patient taking differently: Take 100 mg by mouth daily.) 90 tablet 2  . nitroGLYCERIN (NITROSTAT) 0.4 MG SL tablet Place 1 tablet (0.4 mg total) under the tongue every 5 (five) minutes as needed for chest pain. 50 tablet 1  . spironolactone (ALDACTONE) 25 MG tablet Take 1 tablet (25 mg total) by mouth daily. 90 tablet 3  . torsemide (DEMADEX) 20 MG tablet Take 3 tablets by mouth once daily (Patient taking differently: Take 60 mg by mouth daily.) 270 tablet 3  . clonazePAM (KLONOPIN) 0.5 MG tablet Take 1 tablet by mouth twice daily as needed for anxiety 60 tablet 0   No current facility-administered medications for this encounter.   BP 130/60   Pulse (!) 50   Wt 82.5 kg (181 lb 12.8 oz)   SpO2 96%   BMI 29.34 kg/m    Wt Readings from Last 3 Encounters:  12/05/20 82.5 kg (181 lb 12.8 oz)  11/04/20 84.1 kg (185 lb  6.4 oz)  10/08/20 84.5 kg (186 lb 3.2 oz)    General: NAD Neck: JVP 8 cm, no thyromegaly or thyroid nodule.  Lungs: Clear to auscultation bilaterally with normal respiratory effort. CV: Nondisplaced PMI.  Heart regular S1/S2, no S3/S4, 2/6 SEM RUSB with clear S2.  1+ ankle edema.  No carotid bruit.  Normal pedal pulses.  Abdomen: Soft, nontender, no hepatosplenomegaly, no distention.  Skin: Intact without lesions or rashes.  Neurologic: Alert and oriented x 3.  Psych: Normal affect. Extremities: No clubbing or cyanosis.  HEENT: Normal.   Assessment/Plan: 1. Chronic atrial fibrillation: She  is off nodal blockade with HR in 50s.  - Continue Eliquis 5 mg bid.    2. HTN: BP controlled.  Continue current regimen.  3. Chronic diastolic CHF: NYHA class III symptoms, slowly progressive over time.  Mild volume overload on exam.  PA pressure has been rising on serial echoes, most recent echo in 12/21 with EF 60-65%, mild aortic stenosis, PA systolic pressure 65 mmHg, RV mildly dilated with normal systolic function.  Elevated PA pressure could be due to pulmonary venous hypertension but has never been fully evaluated.  - Continue torsemide 60 mg daily for now but will add Jardiance 10 mg daily which should allow some extra diuresis. BMET 10 days.  - I will arrange for right/left heart catheterization to investigate progressive exertional dyspnea and pulmonary hypertension. We discussed risks/benefits and she agrees to procedure. Hold Eliquis day before and day of procedure.  4. Hyperlipidemia: She has been intolerant of statins and Zetia.     Followup in 2 wks after cath with PA/NP to reassess volume.   Loralie Champagne 12/06/2020

## 2020-12-06 NOTE — H&P (View-Only) (Signed)
Patient ID: [name], female   DOB: December 18, 1934, 86 y.o.   MRN: [id number]    Advanced Heart Failure Clinic Note   PCP: [name], MD Cardiology: [name] HF Cardiology: Aundra Dubin  86 y.o. with history of poorly controlled HTN, chronic atrial fibrillation, chronic diastolic CHF, and prior episode of Takotsubo cardiomyopathy was referred by to HF clinic by Dr. [name]. Patient has had exertional dyspnea since 11/16.  At that time, she presented to the hospital with chest pain.  Coronary angiography showed no significant CAD.  EF was 35-40% by echo, suspected Takotsubo cardiomyopathy.  Her echo in 3/17 showed EF improved to 95-63% but PA systolic pressure elevated at 65 mmHg.  We transitioned her to torsemide.   She has continued to have exertional dyspnea, seems to have worsened over the last few months.  She is short of breath walking about 100 feet or walking up an incline.  Limits her activities but does take care of her husband who as dementia.  No orthopnea/PND.  She gets occasional palpitations (has chronic AF) but does not have chest pain.  Rare lightheadedness. Recent echo in 12/21 showed elevation in PA pressure => EF 65-70%, normal RV, PASP 60 mmHg, mild AS, mild-moderate TR.    Labs (11/16): LDL 80 Labs (4/17): K 4.5, creatinine 1.1, BNP 132 Labs (5/17): LDL 92 Labs (3/18): K 4, creatinine 0.98, LDL 81, proBNP 834 Labs (6/18): K 4.1, creatinine 1.37, hgb 12.5 Labs (10/21): LDL 79, HDL 71  PMH: 1. Hyperlipidemia: Myalgias with atorvastatin, Crestor, simvastatin. Myalgias with Zetia.  2. Chronic atrial fibrillation 3. HTN: Poor control.  4. H/o breast cancer: 2013.  5. GERD 6. Takotsubo cardiomyopathy: Admitted in 11/16 with chest pain.  Coronary angiography showed nonobstructive CAD.  Echo (11/16) showed EF 35-40%.  EF back to normal by 3/17 echo.  7. Chronic diastolic CHF - Echo (8/75) with EF 60-65%, mild aortic stenosis, PA systolic pressure 65 mmHg, RV mildly dilated with  normal systolic function.  - Cardiolite (4/17) with EF 61%, small reversible mid anteroseptal/apical lateral defects thought to be related to shifting breast artifact => low risk study.  - Echo (12/21): EF 65-70%, normal RV, PASP 60 mmHg, mild AS, mild-moderate TR.   8. Ascending aortic aneurysm: 4.3 cm by CTA in 11/16.  9. Pulmonary hypertension: PASP 65 mmHg by echo in 3/17.  CTA chest in 11/16 with no PE.  PFTs in 7/15 with mild obstructive lung disease.  10. Sleep study negative 2017. 11. Bradycardia: off nodal blockers.  - Holter (8/20): Average HR 51, afib 12. Aortic stenosis: Mild.   Social History   Socioeconomic History  . Marital status: Married    Spouse name: Not on file  . Number of children: Not on file  . Years of education: Not on file  . Highest education level: Not on file  Occupational History  . Not on file  Tobacco Use  . Smoking status: Never Smoker  . Smokeless tobacco: Never Used  Vaping Use  . Vaping Use: Never used  Substance and Sexual Activity  . Alcohol use: No  . Drug use: No  . Sexual activity: Not Currently  Other Topics Concern  . Not on file  Social History Narrative  . Not on file   Social Determinants of Health   Financial Resource Strain: Not on file  Food Insecurity: Not on file  Transportation Needs: Not on file  Physical Activity: Not on file  Stress: Not on file  Social  Connections: Not on file  Intimate Partner Violence: Not on file   Family History  Problem Relation Age of Onset  . Heart disease Mother   . Heart attack Mother   . Cancer Sister        stomach and colon  . Heart disease Brother 17  . Hypertension Father   . Cancer Sister   . Stroke Neg Hx    ROS: All systems reviewed and negative except as per HPI.   Current Outpatient Medications  Medication Sig Dispense Refill  . acetaminophen (TYLENOL) 500 MG tablet Take 500-1,000 mg by mouth every 6 (six) hours as needed for headache (pain).    Marland Kitchen amLODipine  (NORVASC) 5 MG tablet TAKE 1 & 1/2 (ONE & ONE-HALF) TABLETS BY MOUTH ONCE DAILY (Patient taking differently: Take 7.5 mg by mouth daily.) 135 tablet 3  . Ascorbic Acid (VITAMIN C) 1000 MG tablet Take 1,000 mg by mouth daily.    . Cholecalciferol (VITAMIN D3) 25 MCG (1000 UT) CAPS Take 1,000 Units by mouth daily.    . cloNIDine (CATAPRES) 0.1 MG tablet Take 1 tablet by mouth twice daily (Patient taking differently: Take 0.1 mg by mouth 2 (two) times daily.) 180 tablet 3  . clotrimazole-betamethasone (LOTRISONE) cream Apply 1 application topically 2 (two) times daily. 30 g 0  . ELIQUIS 5 MG TABS tablet Take 1 tablet by mouth twice daily (Patient taking differently: Take 5 mg by mouth 2 (two) times daily.) 180 tablet 1  . empagliflozin (JARDIANCE) 10 MG TABS tablet Take 1 tablet (10 mg total) by mouth daily before breakfast. 30 tablet 6  . famotidine (PEPCID) 20 MG tablet Take 20 mg by mouth at bedtime.    . ferrous sulfate 325 (65 FE) MG tablet Take 1 tablet (325 mg total) by mouth daily with breakfast. 90 tablet 3  . losartan (COZAAR) 100 MG tablet Take 1 tablet by mouth once daily (Patient taking differently: Take 100 mg by mouth daily.) 90 tablet 2  . nitroGLYCERIN (NITROSTAT) 0.4 MG SL tablet Place 1 tablet (0.4 mg total) under the tongue every 5 (five) minutes as needed for chest pain. 50 tablet 1  . spironolactone (ALDACTONE) 25 MG tablet Take 1 tablet (25 mg total) by mouth daily. 90 tablet 3  . torsemide (DEMADEX) 20 MG tablet Take 3 tablets by mouth once daily (Patient taking differently: Take 60 mg by mouth daily.) 270 tablet 3  . clonazePAM (KLONOPIN) 0.5 MG tablet Take 1 tablet by mouth twice daily as needed for anxiety 60 tablet 0   No current facility-administered medications for this encounter.   BP 130/60   Pulse (!) 50   Wt 82.5 kg (181 lb 12.8 oz)   SpO2 96%   BMI 29.34 kg/m    Wt Readings from Last 3 Encounters:  12/05/20 82.5 kg (181 lb 12.8 oz)  11/04/20 84.1 kg (185 lb  6.4 oz)  10/08/20 84.5 kg (186 lb 3.2 oz)    General: NAD Neck: JVP 8 cm, no thyromegaly or thyroid nodule.  Lungs: Clear to auscultation bilaterally with normal respiratory effort. CV: Nondisplaced PMI.  Heart regular S1/S2, no S3/S4, 2/6 SEM RUSB with clear S2.  1+ ankle edema.  No carotid bruit.  Normal pedal pulses.  Abdomen: Soft, nontender, no hepatosplenomegaly, no distention.  Skin: Intact without lesions or rashes.  Neurologic: Alert and oriented x 3.  Psych: Normal affect. Extremities: No clubbing or cyanosis.  HEENT: Normal.   Assessment/Plan: 1. Chronic atrial fibrillation: She  is off nodal blockade with HR in 50s.  - Continue Eliquis 5 mg bid.    2. HTN: BP controlled.  Continue current regimen.  3. Chronic diastolic CHF: NYHA class III symptoms, slowly progressive over time.  Mild volume overload on exam.  PA pressure has been rising on serial echoes, most recent echo in 12/21 with EF 60-65%, mild aortic stenosis, PA systolic pressure 65 mmHg, RV mildly dilated with normal systolic function.  Elevated PA pressure could be due to pulmonary venous hypertension but has never been fully evaluated.  - Continue torsemide 60 mg daily for now but will add Jardiance 10 mg daily which should allow some extra diuresis. BMET 10 days.  - I will arrange for right/left heart catheterization to investigate progressive exertional dyspnea and pulmonary hypertension. We discussed risks/benefits and she agrees to procedure. Hold Eliquis day before and day of procedure.  4. Hyperlipidemia: She has been intolerant of statins and Zetia.     Followup in 2 wks after cath with PA/NP to reassess volume.   Loralie Champagne 12/06/2020

## 2020-12-08 ENCOUNTER — Other Ambulatory Visit
Admission: RE | Admit: 2020-12-08 | Discharge: 2020-12-08 | Disposition: A | Discharge status: Home / Self Care | Payer: Medicare Other | Source: Ambulatory Visit | Attending: Cardiology | Admitting: Cardiology

## 2020-12-08 ENCOUNTER — Other Ambulatory Visit: Payer: Self-pay

## 2020-12-08 DIAGNOSIS — Z20822 Contact with and (suspected) exposure to covid-19: Secondary | ICD-10-CM | POA: Insufficient documentation

## 2020-12-08 DIAGNOSIS — Z01812 Encounter for preprocedural laboratory examination: Secondary | ICD-10-CM | POA: Diagnosis not present

## 2020-12-09 LAB — SARS CORONAVIRUS 2 (TAT 6-24 HRS): SARS Coronavirus 2: NEGATIVE

## 2020-12-10 ENCOUNTER — Other Ambulatory Visit: Payer: Self-pay

## 2020-12-10 ENCOUNTER — Telehealth (HOSPITAL_COMMUNITY): Payer: Self-pay | Admitting: Pharmacy Technician

## 2020-12-10 ENCOUNTER — Other Ambulatory Visit (HOSPITAL_COMMUNITY): Payer: Self-pay

## 2020-12-10 ENCOUNTER — Ambulatory Visit (HOSPITAL_COMMUNITY)
Admission: RE | Admit: 2020-12-10 | Discharge: 2020-12-10 | Disposition: A | Discharge status: Home / Self Care | Payer: Medicare Other | Attending: Cardiology | Admitting: Cardiology

## 2020-12-10 ENCOUNTER — Ambulatory Visit (HOSPITAL_COMMUNITY)
Admission: RE | Disposition: A | Discharge status: Home / Self Care | Payer: Self-pay | Source: Home / Self Care | Attending: Cardiology

## 2020-12-10 DIAGNOSIS — I2510 Atherosclerotic heart disease of native coronary artery without angina pectoris: Secondary | ICD-10-CM | POA: Diagnosis not present

## 2020-12-10 DIAGNOSIS — Z7901 Long term (current) use of anticoagulants: Secondary | ICD-10-CM | POA: Diagnosis not present

## 2020-12-10 DIAGNOSIS — Z7984 Long term (current) use of oral hypoglycemic drugs: Secondary | ICD-10-CM | POA: Diagnosis not present

## 2020-12-10 DIAGNOSIS — I272 Pulmonary hypertension, unspecified: Secondary | ICD-10-CM | POA: Diagnosis not present

## 2020-12-10 DIAGNOSIS — I5032 Chronic diastolic (congestive) heart failure: Secondary | ICD-10-CM | POA: Insufficient documentation

## 2020-12-10 DIAGNOSIS — Z79899 Other long term (current) drug therapy: Secondary | ICD-10-CM | POA: Insufficient documentation

## 2020-12-10 DIAGNOSIS — E785 Hyperlipidemia, unspecified: Secondary | ICD-10-CM | POA: Diagnosis not present

## 2020-12-10 DIAGNOSIS — I482 Chronic atrial fibrillation, unspecified: Secondary | ICD-10-CM | POA: Diagnosis not present

## 2020-12-10 DIAGNOSIS — I110 Hypertensive heart disease with heart failure: Secondary | ICD-10-CM | POA: Diagnosis not present

## 2020-12-10 HISTORY — PX: RIGHT/LEFT HEART CATH AND CORONARY ANGIOGRAPHY: CATH118266

## 2020-12-10 LAB — BASIC METABOLIC PANEL
Anion gap: 6 (ref 5–15)
BUN: 41 mg/dL — ABNORMAL HIGH (ref 8–23)
CO2: 25 mmol/L (ref 22–32)
Calcium: 9.9 mg/dL (ref 8.9–10.3)
Chloride: 101 mmol/L (ref 98–111)
Creatinine, Ser: 1.07 mg/dL — ABNORMAL HIGH (ref 0.44–1.00)
GFR, Estimated: 51 mL/min — ABNORMAL LOW (ref >60)
Glucose, Bld: 103 mg/dL — ABNORMAL HIGH (ref 70–99)
Potassium: 4.4 mmol/L (ref 3.5–5.1)
Sodium: 132 mmol/L — ABNORMAL LOW (ref 135–145)

## 2020-12-10 SURGERY — RIGHT/LEFT HEART CATH AND CORONARY ANGIOGRAPHY
Anesthesia: LOCAL

## 2020-12-10 MED ORDER — ONDANSETRON HCL 4 MG/2ML IJ SOLN: 4.0000 mg | Freq: Four times a day (QID) | INTRAMUSCULAR | Status: DC | PRN

## 2020-12-10 MED ORDER — LIDOCAINE HCL (PF) 1 % IJ SOLN
INTRAMUSCULAR | Status: AC
  Filled 2020-12-10: qty 30

## 2020-12-10 MED ORDER — DIPHENHYDRAMINE HCL 50 MG/ML IJ SOLN
25.0000 mg | Freq: Once | INTRAMUSCULAR | Status: AC
  Administered 2020-12-10: 25 mg via INTRAVENOUS

## 2020-12-10 MED ORDER — SODIUM CHLORIDE 0.9% FLUSH: 3.0000 mL | INTRAVENOUS | Status: DC | PRN

## 2020-12-10 MED ORDER — SODIUM CHLORIDE 0.9% FLUSH: 3.0000 mL | Freq: Two times a day (BID) | INTRAVENOUS | Status: DC

## 2020-12-10 MED ORDER — DIPHENHYDRAMINE HCL 50 MG/ML IJ SOLN
INTRAMUSCULAR | Status: AC
  Filled 2020-12-10: qty 1

## 2020-12-10 MED ORDER — SODIUM CHLORIDE 0.9 % IV SOLN: 250.0000 mL | INTRAVENOUS | Status: DC | PRN

## 2020-12-10 MED ORDER — ACETAMINOPHEN 325 MG PO TABS: 650.0000 mg | ORAL_TABLET | ORAL | Status: DC | PRN

## 2020-12-10 MED ORDER — TORSEMIDE 20 MG PO TABS: 60.0000 mg | ORAL_TABLET | Freq: Every day | ORAL | 3 refills | Status: DC

## 2020-12-10 MED ORDER — MIDAZOLAM HCL 2 MG/2ML IJ SOLN
INTRAMUSCULAR | Status: AC
  Filled 2020-12-10: qty 2

## 2020-12-10 MED ORDER — HEPARIN (PORCINE) IN NACL 1000-0.9 UT/500ML-% IV SOLN
INTRAVENOUS | Status: DC | PRN
  Administered 2020-12-10 (×2): 500 mL

## 2020-12-10 MED ORDER — FENTANYL CITRATE (PF) 100 MCG/2ML IJ SOLN
INTRAMUSCULAR | Status: DC | PRN
  Administered 2020-12-10 (×2): 25 ug via INTRAVENOUS

## 2020-12-10 MED ORDER — HEPARIN SODIUM (PORCINE) 1000 UNIT/ML IJ SOLN
INTRAMUSCULAR | Status: AC
  Filled 2020-12-10: qty 1

## 2020-12-10 MED ORDER — ASPIRIN 81 MG PO CHEW
CHEWABLE_TABLET | ORAL | Status: AC
  Filled 2020-12-10: qty 1

## 2020-12-10 MED ORDER — LIDOCAINE HCL (PF) 1 % IJ SOLN
INTRAMUSCULAR | Status: DC | PRN
  Administered 2020-12-10: 10 mL
  Administered 2020-12-10 (×2): 2 mL

## 2020-12-10 MED ORDER — HEPARIN (PORCINE) IN NACL 1000-0.9 UT/500ML-% IV SOLN
INTRAVENOUS | Status: AC
  Filled 2020-12-10: qty 1000

## 2020-12-10 MED ORDER — HYDRALAZINE HCL 20 MG/ML IJ SOLN: 10.0000 mg | INTRAMUSCULAR | Status: DC | PRN

## 2020-12-10 MED ORDER — IOHEXOL 350 MG/ML SOLN
INTRAVENOUS | Status: DC | PRN
  Administered 2020-12-10: 70 mL

## 2020-12-10 MED ORDER — AMLODIPINE BESYLATE 5 MG PO TABS
7.5000 mg | ORAL_TABLET | ORAL | Status: AC
  Administered 2020-12-10: 7.5 mg via ORAL
  Filled 2020-12-10: qty 2
  Filled 2020-12-10: qty 1.5

## 2020-12-10 MED ORDER — ELIQUIS 5 MG PO TABS: 1.0000 | ORAL_TABLET | Freq: Two times a day (BID) | ORAL | 1 refills | Status: DC

## 2020-12-10 MED ORDER — FENTANYL CITRATE (PF) 100 MCG/2ML IJ SOLN
INTRAMUSCULAR | Status: AC
  Filled 2020-12-10: qty 2

## 2020-12-10 MED ORDER — VERAPAMIL HCL 2.5 MG/ML IV SOLN
INTRAVENOUS | Status: AC
  Filled 2020-12-10: qty 2

## 2020-12-10 MED ORDER — METHYLPREDNISOLONE SODIUM SUCC 125 MG IJ SOLR
INTRAMUSCULAR | Status: AC
  Filled 2020-12-10: qty 2

## 2020-12-10 MED ORDER — CLONIDINE HCL 0.2 MG PO TABS
0.1000 mg | ORAL_TABLET | ORAL | Status: AC
  Administered 2020-12-10: 0.1 mg via ORAL
  Filled 2020-12-10: qty 1
  Filled 2020-12-10: qty 0.5

## 2020-12-10 MED ORDER — METHYLPREDNISOLONE SODIUM SUCC 125 MG IJ SOLR
125.0000 mg | Freq: Once | INTRAMUSCULAR | Status: AC
  Administered 2020-12-10: 125 mg via INTRAVENOUS

## 2020-12-10 MED ORDER — SODIUM CHLORIDE 0.9 % IV SOLN: INTRAVENOUS | Status: DC

## 2020-12-10 MED ORDER — LABETALOL HCL 5 MG/ML IV SOLN: 10.0000 mg | INTRAVENOUS | Status: DC | PRN

## 2020-12-10 MED ORDER — MIDAZOLAM HCL 2 MG/2ML IJ SOLN
INTRAMUSCULAR | Status: DC | PRN
  Administered 2020-12-10: 1 mg via INTRAVENOUS

## 2020-12-10 SURGICAL SUPPLY — 15 items
CATH INFINITI 5FR MULTPACK ANG (CATHETERS) ×1 IMPLANT
CATH SWAN GANZ 7F STRAIGHT (CATHETERS) ×1 IMPLANT
CLOSURE MYNX CONTROL 5F (Vascular Products) ×1 IMPLANT
DEVICE RAD COMP TR BAND LRG (VASCULAR PRODUCTS) ×1 IMPLANT
GLIDESHEATH SLEND SS 6F .021 (SHEATH) ×1 IMPLANT
GLIDESHEATH SLENDER 7FR .021G (SHEATH) ×1 IMPLANT
GUIDEWIRE .025 260CM (WIRE) ×1 IMPLANT
GUIDEWIRE INQWIRE 1.5J.035X260 (WIRE) IMPLANT
INQWIRE 1.5J .035X260CM (WIRE) ×2
KIT HEART LEFT (KITS) ×2 IMPLANT
PACK CARDIAC CATHETERIZATION (CUSTOM PROCEDURE TRAY) ×2 IMPLANT
SHEATH PINNACLE 5F 10CM (SHEATH) ×1 IMPLANT
SHEATH PROBE COVER 6X72 (BAG) ×1 IMPLANT
TRANSDUCER W/STOPCOCK (MISCELLANEOUS) ×2 IMPLANT
WIRE EMERALD 3MM-J .035X150CM (WIRE) ×1 IMPLANT

## 2020-12-10 NOTE — Discharge Instructions (Signed)
1. Restart Eliquis (apixaban) tomorrow morning.  2. I will arrange for PYP scan (to assess for cardiac amyloidosis).    Radial Site Care  This sheet gives you information about how to care for yourself after your procedure. Your health care provider [place] also give you more specific instructions. If you have problems or questions, contact your health care provider. What can I expect after the procedure? After the procedure, it is common to have:  Bruising and tenderness at the catheter insertion area. Follow these instructions at home: Medicines  Take over-the-counter and prescription medicines only as told by your health care provider. Insertion site care  Follow instructions from your health care provider about how to take care of your insertion site. Make sure you: ? Wash your hands with soap and water before you change your bandage (dressing). If soap and water are not available, use hand sanitizer. ? Change your dressing as told by your health care provider. ? Leave stitches (sutures), skin glue, or adhesive strips in place. These skin closures [place] need to stay in place for 2 weeks or longer. If adhesive strip edges start to loosen and curl up, you [place]lin trim the loose edges. Do not remove adhesive strips completely unless your health care provider tells you to do that.  Check your insertion site every day for signs of infection. Check for: ? Redness, swelling, or pain. ? Fluid or blood. ? Pus or a bad smell. ? Warmth.  Do not take baths, swim, or use a hot tub until your health care provider approves.  You Ambriz shower 24-48 hours after the procedure, or as directed by your health care provider. ? Remove the dressing and gently wash the site with plain soap and water. ? Pat the area dry with a clean towel. ? Do not rub the site. That could cause bleeding.  Do not apply powder or lotion to the site. Activity  For 24 hours after the procedure, or as directed by your health care  provider: ? Do not flex or bend the affected arm. ? Do not push or pull heavy objects with the affected arm. ? Do not drive yourself home from the hospital or clinic. You [address] drive 24 hours after the procedure unless your health care provider tells you not to. ? Do not operate machinery or power tools.  Do not lift anything that is heavier than 10 lb (4.5 kg), or the limit that you are told, until your health care provider says that it is safe.  Ask your health care provider when it is okay to: ? Return to work or school. ? Resume usual physical activities or sports. ? Resume sexual activity.   General instructions  If the catheter site starts to bleed, raise your arm and put firm pressure on the site. If the bleeding does not stop, get help right away. This is a medical emergency.  If you went home on the same day as your procedure, a responsible adult should be with you for the first 24 hours after you arrive home.  Keep all follow-up visits as told by your health care provider. This is important. Contact a health care provider if:  You have a fever.  You have redness, swelling, or yellow drainage around your insertion site. Get help right away if:  You have unusual pain at the radial site.  The catheter insertion area swells very fast.  The insertion area is bleeding, and the bleeding does not stop when you hold  steady pressure on the area.  Your arm or hand becomes pale, cool, tingly, or numb. These symptoms [place] represent a serious problem that is an emergency. Do not wait to see if the symptoms will go away. Get medical help right away. Call your local emergency services (911 in the [place] Do not drive yourself to the hospital. Summary  After the procedure, it is common to have bruising and tenderness at the site.  Follow instructions from your health care provider about how to take care of your radial site wound. Check the wound every day for signs of infection.  Do not  lift anything that is heavier than 10 lb (4.5 kg), or the limit that you are told, until your health care provider says that it is safe. This information is not intended to replace advice given to you by your health care provider. Make sure you discuss any questions you have with your health care provider. Document Revised: 09/28/2017 Document Reviewed: 09/28/2017 Elsevier Patient Education  2021 [place].  Femoral Site Care  This sheet gives you information about how to care for yourself after your procedure. Your health care provider [place] also give you more specific instructions. If you have problems or questions, contact your health care provider. What can I expect after the procedure? After the procedure, it is common to have:  Bruising that usually fades within 1-2 weeks.  Tenderness at the site. Follow these instructions at home: Wound care  Follow instructions from your health care provider about how to take care of your insertion site. Make sure you: ? Wash your hands with soap and water before you change your bandage (dressing). If soap and water are not available, use hand sanitizer. ? Change your dressing as told by your health care provider. ? Leave stitches (sutures), skin glue, or adhesive strips in place. These skin closures Buh[place] need to stay in place for 2 weeks or longer. If adhesive strip edges start to loosen and curl up, you [place] trim the loose edges. Do not remove adhesive strips completely unless your health care provider tells you to do that.  Do not take baths, swim, or use a hot tub until your health care provider approves.  You [place] shower 24-48 hours after the procedure or as told by your health care provider. ? Gently wash the site with plain soap and water. ? Pat the area dry with a clean towel. ? Do not rub the site. This [place] cause bleeding.  Do not apply powder or lotion to the site. Keep the site clean and dry.  Check your femoral site every day for  signs of infection. Check for: ? Redness, swelling, or pain. ? Fluid or blood. ? Warmth. ? Pus or a bad smell. Activity  For the first 2-3 days after your procedure, or as long as directed: ? Avoid climbing stairs as much as possible. ? Do not squat.  Do not lift anything that is heavier than 10 lb (4.5 kg), or the limit that you are told, until your health care provider says that it is safe.  Rest as directed. ? Avoid sitting for a long time without moving. Get up to take short walks every 1-2 hours.  Do not drive for 24 hours if you were given a medicine to help you relax (sedative). General instructions  Take over-the-counter and prescription medicines only as told by your health care provider.  Keep all follow-up visits as told by your health care provider. This is  important. Contact a health care provider if you have:  A fever or chills.  You have redness, swelling, or pain around your insertion site. Get help right away if:  The catheter insertion area swells very fast.  You pass out.  You suddenly start to sweat or your skin gets clammy.  The catheter insertion area is bleeding, and the bleeding does not stop when you hold steady pressure on the area.  The area near or just beyond the catheter insertion site becomes pale, cool, tingly, or numb. These symptoms [place] represent a serious problem that is an emergency. Do not wait to see if the symptoms will go away. Get medical help right away. Call your local emergency services (911 in the [place] Do not drive yourself to the hospital. Summary  After the procedure, it is common to have bruising that usually fades within 1-2 weeks.  Check your femoral site every day for signs of infection.  Do not lift anything that is heavier than 10 lb (4.5 kg), or the limit that you are told, until your health care provider says that it is safe. This information is not intended to replace advice given to you by your health care  provider. Make sure you discuss any questions you have with your health care provider. Document Revised: 04/25/2020 Document Reviewed: 04/25/2020 Elsevier Patient Education  [place].

## 2020-12-10 NOTE — Telephone Encounter (Signed)
Patient Advocate Encounter  Patient was seen in clinic recently and started on Jardiance. The current 30 day co-pay is $37, 90 is $111. Called and left the patient a message to discuss affordability. Will start BI Cares application if unaffordable.

## 2020-12-10 NOTE — Interval H&P Note (Signed)
History and Physical Interval Note:  12/10/2020 12:03 PM  [name]  has presented today for surgery, with the diagnosis of hp.  The various methods of treatment have been discussed with the patient and family. After consideration of risks, benefits and other options for treatment, the patient has consented to  Procedure(s): RIGHT/LEFT HEART CATH AND CORONARY ANGIOGRAPHY (N/A) as a surgical intervention.  The patient's history has been reviewed, patient examined, no change in status, stable for surgery.  I have reviewed the patient's chart and labs.  Questions were answered to the patient's satisfaction.     Audrina Marten [employer]

## 2020-12-10 NOTE — Progress Notes (Signed)
Patient ambulated to Bathroom, voided.  Right groin is CDI.

## 2020-12-11 ENCOUNTER — Encounter (HOSPITAL_COMMUNITY): Payer: Self-pay | Admitting: Cardiology

## 2020-12-11 ENCOUNTER — Telehealth: Payer: Self-pay | Admitting: Family Medicine

## 2020-12-11 LAB — POCT I-STAT EG7
Acid-base deficit: 2 mmol/L (ref 0.0–2.0)
Acid-base deficit: 3 mmol/L — ABNORMAL HIGH (ref 0.0–2.0)
Bicarbonate: 23 mmol/L (ref 20.0–28.0)
Bicarbonate: 24.8 mmol/L (ref 20.0–28.0)
Calcium, Ion: 1.23 mmol/L (ref 1.15–1.40)
Calcium, Ion: 1.33 mmol/L (ref 1.15–1.40)
HCT: 29 % — ABNORMAL LOW (ref 36.0–46.0)
HCT: 30 % — ABNORMAL LOW (ref 36.0–46.0)
Hemoglobin: 10.2 g/dL — ABNORMAL LOW (ref 12.0–15.0)
Hemoglobin: 9.9 g/dL — ABNORMAL LOW (ref 12.0–15.0)
O2 Saturation: 80 %
O2 Saturation: 80 %
Potassium: 4.3 mmol/L (ref 3.5–5.1)
Potassium: 4.5 mmol/L (ref 3.5–5.1)
Sodium: 136 mmol/L (ref 135–145)
Sodium: 137 mmol/L (ref 135–145)
TCO2: 24 mmol/L (ref 22–32)
TCO2: 26 mmol/L (ref 22–32)
pCO2, Ven: 47 mmHg (ref 44.0–60.0)
pCO2, Ven: 49.4 mmHg (ref 44.0–60.0)
pH, Ven: 7.299 (ref 7.250–7.430)
pH, Ven: 7.309 (ref 7.250–7.430)
pO2, Ven: 49 mmHg — ABNORMAL HIGH (ref 32.0–45.0)
pO2, Ven: 49 mmHg — ABNORMAL HIGH (ref 32.0–45.0)

## 2020-12-11 MED FILL — Verapamil HCl IV Soln 2.5 MG/ML: INTRAVENOUS | Qty: 2 | Status: AC

## 2020-12-11 MED FILL — Heparin Sodium (Porcine) Inj 1000 Unit/ML: INTRAMUSCULAR | Qty: 10 | Status: AC

## 2020-12-11 NOTE — Telephone Encounter (Signed)
Call placed to patient.   Reports that she took her spouse's medication on accident. States that she is not having any noticeable side effects.   Advised that gabapentin and Hydrocodone could cause sedation, so I advised her to refrain from driving. Advised that if she notices any adverse reactions to call office immediately.   PCP made aware.

## 2020-12-11 NOTE — Telephone Encounter (Signed)
Patient accidentally took husband's medication (hydrocodone and ganapanton ?); patient wants to know if they will have an adverse affect on her or conflict with her current meds. Please advise at [phone]

## 2020-12-11 NOTE — Telephone Encounter (Signed)
Spoke with patient. She is not interested in starting Jardiance at this time. She is aware that based off of the income and household size she provided, that she would qualify for assistance. She has a concern over potential side effects like itching and/or a rash.   Will inform Dr. Aundra Dubin of the patient's decision.  Charlann Boxer, CPhT

## 2020-12-12 ENCOUNTER — Telehealth (HOSPITAL_COMMUNITY): Payer: Self-pay | Admitting: *Deleted

## 2020-12-12 NOTE — Telephone Encounter (Signed)
Attempted to reach pt by phone again called both numbers no answer left messages.

## 2020-12-12 NOTE — Telephone Encounter (Signed)
Suspect bruising from cath.  Would have her hold Eliquis for 2 days.  Needs to come by for nursing visit to check arm.

## 2020-12-12 NOTE — Telephone Encounter (Signed)
Pt called c/o swelling in right arm. Pt said her skin was torn when "they were trying to place the needle" for cath. Pt said her arm is blue starting at the middle of her arm all the way to her hand. She also notices some swelling. Pt said her arm "felt warm yesterday like it had a fever". Pt asked that I forward the message to Dr.McLean

## 2020-12-12 NOTE — Telephone Encounter (Signed)
Left vm for pt to return my call.  

## 2020-12-13 NOTE — Telephone Encounter (Signed)
Pt called with chest pain, started this AM -her cath sites are stable currently.  She does have NTG, will have her try and if no improvement to come to ER, I have contacted Dr. Aundra Dubin as well and will have take an extra torsemide today.  If no better then she would need to come to ER.  Pt aware.

## 2020-12-15 ENCOUNTER — Other Ambulatory Visit: Payer: Self-pay | Admitting: Family Medicine

## 2021-01-09 DIAGNOSIS — D225 Melanocytic nevi of trunk: Secondary | ICD-10-CM | POA: Diagnosis not present

## 2021-01-09 DIAGNOSIS — C44319 Basal cell carcinoma of skin of other parts of face: Secondary | ICD-10-CM | POA: Diagnosis not present

## 2021-01-09 DIAGNOSIS — D2272 Melanocytic nevi of left lower limb, including hip: Secondary | ICD-10-CM | POA: Diagnosis not present

## 2021-01-09 DIAGNOSIS — D2261 Melanocytic nevi of right upper limb, including shoulder: Secondary | ICD-10-CM | POA: Diagnosis not present

## 2021-01-09 DIAGNOSIS — D2262 Melanocytic nevi of left upper limb, including shoulder: Secondary | ICD-10-CM | POA: Diagnosis not present

## 2021-01-15 ENCOUNTER — Other Ambulatory Visit: Payer: Self-pay

## 2021-01-15 ENCOUNTER — Ambulatory Visit (HOSPITAL_COMMUNITY)
Admission: RE | Admit: 2021-01-15 | Discharge: 2021-01-15 | Disposition: A | Discharge status: Home / Self Care | Payer: Medicare Other | Source: Ambulatory Visit | Attending: Cardiology | Admitting: Cardiology

## 2021-01-15 ENCOUNTER — Encounter (HOSPITAL_COMMUNITY): Payer: Self-pay

## 2021-01-15 VITALS — BP 178/42 | HR 59 | Wt 180.8 lb

## 2021-01-15 DIAGNOSIS — Z8249 Family history of ischemic heart disease and other diseases of the circulatory system: Secondary | ICD-10-CM | POA: Insufficient documentation

## 2021-01-15 DIAGNOSIS — Z79899 Other long term (current) drug therapy: Secondary | ICD-10-CM | POA: Diagnosis not present

## 2021-01-15 DIAGNOSIS — I482 Chronic atrial fibrillation, unspecified: Secondary | ICD-10-CM | POA: Insufficient documentation

## 2021-01-15 DIAGNOSIS — I5032 Chronic diastolic (congestive) heart failure: Secondary | ICD-10-CM | POA: Diagnosis not present

## 2021-01-15 DIAGNOSIS — I110 Hypertensive heart disease with heart failure: Secondary | ICD-10-CM | POA: Insufficient documentation

## 2021-01-15 DIAGNOSIS — I2510 Atherosclerotic heart disease of native coronary artery without angina pectoris: Secondary | ICD-10-CM | POA: Insufficient documentation

## 2021-01-15 DIAGNOSIS — Z87440 Personal history of urinary (tract) infections: Secondary | ICD-10-CM | POA: Insufficient documentation

## 2021-01-15 DIAGNOSIS — I350 Nonrheumatic aortic (valve) stenosis: Secondary | ICD-10-CM | POA: Insufficient documentation

## 2021-01-15 DIAGNOSIS — Z7901 Long term (current) use of anticoagulants: Secondary | ICD-10-CM | POA: Insufficient documentation

## 2021-01-15 DIAGNOSIS — E785 Hyperlipidemia, unspecified: Secondary | ICD-10-CM | POA: Insufficient documentation

## 2021-01-15 LAB — BASIC METABOLIC PANEL
Anion gap: 9 (ref 5–15)
BUN: 42 mg/dL — ABNORMAL HIGH (ref 8–23)
CO2: 29 mmol/L (ref 22–32)
Calcium: 9.8 mg/dL (ref 8.9–10.3)
Chloride: 94 mmol/L — ABNORMAL LOW (ref 98–111)
Creatinine, Ser: 1.34 mg/dL — ABNORMAL HIGH (ref 0.44–1.00)
GFR, Estimated: 39 mL/min — ABNORMAL LOW (ref >60)
Glucose, Bld: 105 mg/dL — ABNORMAL HIGH (ref 70–99)
Potassium: 4.4 mmol/L (ref 3.5–5.1)
Sodium: 132 mmol/L — ABNORMAL LOW (ref 135–145)

## 2021-01-15 LAB — BRAIN NATRIURETIC PEPTIDE: B Natriuretic Peptide: 109.8 pg/mL — ABNORMAL HIGH (ref 0.0–100.0)

## 2021-01-15 MED ORDER — TORSEMIDE 20 MG PO TABS: 80.0000 mg | ORAL_TABLET | Freq: Every day | ORAL | 3 refills | Status: DC

## 2021-01-15 NOTE — Patient Instructions (Signed)
INCREASE Torsemide to 80 mg daily  Labs today We will only contact you if something comes back abnormal or we need to make some changes. Otherwise no news is good news!  Your physician recommends that you schedule a follow-up appointment in: 4-6 weeks  Do the following things EVERYDAY: 1) Weigh yourself in the morning before breakfast. Write it down and keep it in a log. 2) Take your medicines as prescribed 3) Eat low salt foods--Limit salt (sodium) to 2000 mg per day.  4) Stay as active as you can everyday 5) Limit all fluids for the day to less than 2 liters  At the [facility], you and your health needs are our priority. As part of our continuing mission to provide you with exceptional heart care, we have created designated Provider Care Teams. These Care Teams include your primary Cardiologist (physician) and Advanced Practice Providers (APPs- Physician Assistants and Nurse Practitioners) who all work together to provide you with the care you need, when you need it.   You [place] see any of the following providers on your designated Care Team at your next follow up: Marland Kitchen Dr Glori Bickers . Dr Loralie Champagne . Dr Vickki Muff . Darrick Grinder, NP . [name], [place] . Audry Riles, PharmD   Please be sure to bring in all your medications bottles to every appointment.   If you have any questions or concerns before your next appointment please send [place] a message through [place] or call our office at [phone].    TO LEAVE A MESSAGE FOR THE NURSE SELECT OPTION 2, PLEASE LEAVE A MESSAGE INCLUDING: . YOUR NAME . DATE OF BIRTH . CALL BACK NUMBER . REASON FOR CALL**this is important as we prioritize the call backs  YOU WILL RECEIVE A CALL BACK THE SAME DAY AS LONG AS YOU CALL BEFORE 4:00 PM

## 2021-01-15 NOTE — Progress Notes (Signed)
Patient ID: [name], female   DOB: [birth year], 86 y.o.   MRN: [id number]    Advanced Heart Failure Clinic Note   PCP: [name], MD Cardiology: [name] HF Cardiology: Aundra Dubin  86 y.o. with history of poorly controlled HTN, chronic atrial fibrillation, chronic diastolic CHF, and prior episode of Takotsubo cardiomyopathy was referred by to HF clinic by Dr. [name]. Patient has had exertional dyspnea since 11/16.  At that time, she presented to the hospital with chest pain.  Coronary angiography showed no significant CAD.  EF was 35-40% by echo, suspected Takotsubo cardiomyopathy.  Her echo in 3/17 showed EF improved to 19-75% but PA systolic pressure elevated at 65 mmHg.  We transitioned her to torsemide.   Recent echo in 12/21 showed elevation in PA pressure => EF 65-70%, normal RV, PASP 60 mmHg, mild AS, mild-moderate TR.   At recent f/u w/ Dr. Aundra Dubin 4/1, she continued to endose exertional dyspnea, seems to have worsened over the last few months.  Short of breath walking about 100 feet or walking up an incline.  Limits her activities but does take care of her husband who as dementia.  She denied orthopnea/PND. No CP. She was instrurcted to continue torsemide 60 daily and to start Jardiance 10 mg daily to help w/ diuresis. She was also referred for [facility]. This was completed 4/6 and showed nonobstructive CAD, normal RA pressure with elevated PCWP and prominent V-waves, mild pulmonary venous hypertension. Although elevated PCWP with prominent V-waves is suggestive of significant mitral regurgitation, her prior echo showed no significant MR.  It is suspected this is representative of significant LV diastolic dysfunction, raising concern for possbiel cardiac amyloidosis. She has been ordered to undergo further w/u. Multiple Myeloma panel, urine immunofixation and PYP scan ordered.   She returns to clinic today for f/u. Feels about the same. NYHA Class III. No resting dyspnea. She did not start  Jardiance as directed. She decided not to take after reading side effect profile given her h/o recurrent UTIs.    Labs (11/16): LDL 80 Labs (4/17): K 4.5, creatinine 1.1, BNP 132 Labs (5/17): LDL 92 Labs (3/18): K 4, creatinine 0.98, LDL 81, proBNP 834 Labs (6/18): K 4.1, creatinine 1.37, hgb 12.5 Labs (10/21): LDL 79, HDL 71 Labs (4/22): Scr 1.07, K 4.4   PMH: 1. Hyperlipidemia: Myalgias with atorvastatin, Crestor, simvastatin. Myalgias with Zetia.  2. Chronic atrial fibrillation 3. HTN: Poor control.  4. H/o breast cancer: 2013.  5. GERD 6. Takotsubo cardiomyopathy: Admitted in 11/16 with chest pain.  Coronary angiography showed nonobstructive CAD.  Echo (11/16) showed EF 35-40%.  EF back to normal by 3/17 echo.  7. Chronic diastolic CHF - Echo (8/83) with EF 60-65%, mild aortic stenosis, PA systolic pressure 65 mmHg, RV mildly dilated with normal systolic function.  - Cardiolite (4/17) with EF 61%, small reversible mid anteroseptal/apical lateral defects thought to be related to shifting breast artifact => low risk study.  - Echo (12/21): EF 65-70%, normal RV, PASP 60 mmHg, mild AS, mild-moderate TR.   8. Ascending aortic aneurysm: 4.3 cm by CTA in 11/16.  9. Pulmonary hypertension: PASP 65 mmHg by echo in 3/17.  CTA chest in 11/16 with no PE.  PFTs in 7/15 with mild obstructive lung disease.  10. Sleep study negative 2017. 11. Bradycardia: off nodal blockers.  - Holter (8/20): Average HR 51, afib 12. Aortic stenosis: Mild.   Social History   Socioeconomic History  . Marital status: Married  Spouse name: Not on file  . Number of children: Not on file  . Years of education: Not on file  . Highest education level: Not on file  Occupational History  . Not on file  Tobacco Use  . Smoking status: Never Smoker  . Smokeless tobacco: Never Used  Vaping Use  . Vaping Use: Never used  Substance and Sexual Activity  . Alcohol use: No  . Drug use: No  . Sexual activity: Not  Currently  Other Topics Concern  . Not on file  Social History Narrative  . Not on file   Social Determinants of Health   Financial Resource Strain: Not on file  Food Insecurity: Not on file  Transportation Needs: Not on file  Physical Activity: Not on file  Stress: Not on file  Social Connections: Not on file  Intimate Partner Violence: Not on file   Family History  Problem Relation Age of Onset  . Heart disease Mother   . Heart attack Mother   . Cancer Sister        stomach and colon  . Heart disease Brother 69  . Hypertension Father   . Cancer Sister   . Stroke Neg Hx    ROS: All systems reviewed and negative except as per HPI.   Current Outpatient Medications  Medication Sig Dispense Refill  . acetaminophen (TYLENOL) 500 MG tablet Take 500-1,000 mg by mouth every 6 (six) hours as needed for headache (pain).    Marland Kitchen amLODipine (NORVASC) 5 MG tablet TAKE 1 & 1/2 (ONE & ONE-HALF) TABLETS BY MOUTH ONCE DAILY (Patient taking differently: Take 7.5 mg by mouth daily.) 135 tablet 3  . apixaban (ELIQUIS) 5 MG TABS tablet Take 1 tablet (5 mg total) by mouth 2 (two) times daily. 180 tablet 1  . Ascorbic Acid (VITAMIN C) 1000 MG tablet Take 1,000 mg by mouth once a week.    . Cholecalciferol (VITAMIN D3) 25 MCG (1000 UT) CAPS Take 1,000 Units by mouth once a week.    . clonazePAM (KLONOPIN) 0.5 MG tablet Take 1 tablet by mouth twice daily as needed for anxiety (Patient taking differently: Take 0.5 mg by mouth 2 (two) times daily as needed for anxiety.) 60 tablet 0  . cloNIDine (CATAPRES) 0.1 MG tablet Take 1 tablet by mouth twice daily (Patient taking differently: Take 0.1 mg by mouth 2 (two) times daily.) 180 tablet 3  . clotrimazole-betamethasone (LOTRISONE) cream Apply 1 application topically 2 (two) times daily. 30 g 0  . empagliflozin (JARDIANCE) 10 MG TABS tablet Take 1 tablet (10 mg total) by mouth daily before breakfast. (Patient not taking: Reported on 12/08/2020) 30 tablet 6  .  famotidine (PEPCID) 20 MG tablet Take 20 mg by mouth at bedtime.    . ferrous sulfate 325 (65 FE) MG tablet Take 1 tablet (325 mg total) by mouth daily with breakfast. 90 tablet 3  . losartan (COZAAR) 100 MG tablet Take 1 tablet by mouth once daily (Patient taking differently: Take 100 mg by mouth daily.) 90 tablet 2  . nitroGLYCERIN (NITROSTAT) 0.4 MG SL tablet DISSOLVE ONE TABLET UNDER THE TONGUE EVERY 5 MINUTES AS NEEDED FOR CHEST PAIN.  DO NOT EXCEED A TOTAL OF 3 DOSES IN 15 MINUTES 25 tablet 0  . spironolactone (ALDACTONE) 25 MG tablet Take 1 tablet (25 mg total) by mouth daily. 90 tablet 3  . torsemide (DEMADEX) 20 MG tablet Take 3 tablets (60 mg total) by mouth daily. 270 tablet 3  No current facility-administered medications for this visit.   There were no vitals taken for this visit.   Wt Readings from Last 3 Encounters:  12/10/20 84.8 kg  12/05/20 82.5 kg  11/04/20 84.1 kg    PHYSICAL EXAM: General:  Well appearing, eldelry. No respiratory difficulty HEENT: normal Neck: supple. no JVD elevated to jaw. Carotids 2+ bilat; no bruits. No lymphadenopathy or thyromegaly appreciated. Cor: PMI nondisplaced. Irregularly irregular rhythm, slow rate. No rubs, gallops or murmurs. Lungs: clear Abdomen: soft, nontender, nondistended. No hepatosplenomegaly. No bruits or masses. Good bowel sounds. Extremities: no cyanosis, clubbing, rash, 1+ bilateral LE  Neuro: alert & oriented x 3, cranial nerves grossly intact. moves all 4 extremities w/o difficulty. Affect pleasant.   Assessment/Plan: 1. Chronic atrial fibrillation: She is off nodal blockade with HR in 50s.  - Continue Eliquis 5 mg bid.      2. HTN: mildly elevated, in setting of fluid overload. HR wont allow addition of  blocker - increase torsemide to 80 mg daily  - increase amlodipine from 7.5>>10 mg daily.  3. Chronic diastolic CHF: NYHA class III symptoms, slowly progressive over time.  Most recent echo in 12/21 with EF 60-65%,  mild aortic stenosis, PA systolic pressure 65 mmHg, RV mildly dilated with normal systolic function.  [place] 4/22 showed elevated PCWP and prominent V-waves, mild pulmonary venous hypertension. Recent echo w/ no significant MR. Suspect RHC findings are representative of significant LV diastolic dysfunction, raising concern for possbiel cardiac amyloidosis. - plan PYP scan, multiple myeloma panel and urine immunofixation  - she is fluid overloaded on exam, NYHA Class III. Refusing to take Jardiance due to h/o frequent UTIs.  - Increase torsemide to 80 mg daily   - check BMP today and again in 7 days  4. Hyperlipidemia: She has been intolerant of statins and Zetia.     F/u BMP in 1 week. F/u w/ APP in 3-4 weeks   [name], PA-C  01/15/2021

## 2021-01-19 LAB — IMMUNOFIXATION, URINE

## 2021-01-20 DIAGNOSIS — N183 Chronic kidney disease, stage 3 unspecified: Secondary | ICD-10-CM | POA: Diagnosis not present

## 2021-01-20 DIAGNOSIS — D631 Anemia in chronic kidney disease: Secondary | ICD-10-CM | POA: Diagnosis not present

## 2021-01-20 DIAGNOSIS — R1013 Epigastric pain: Secondary | ICD-10-CM | POA: Diagnosis not present

## 2021-01-20 DIAGNOSIS — R195 Other fecal abnormalities: Secondary | ICD-10-CM | POA: Diagnosis not present

## 2021-01-20 LAB — MULTIPLE MYELOMA PANEL, SERUM
Albumin SerPl Elph-Mcnc: 3.9 g/dL (ref 2.9–4.4)
Albumin/Glob SerPl: 1.2 (ref 0.7–1.7)
Alpha 1: 0.3 g/dL (ref 0.0–0.4)
Alpha2 Glob SerPl Elph-Mcnc: 0.8 g/dL (ref 0.4–1.0)
B-Globulin SerPl Elph-Mcnc: 1.2 g/dL (ref 0.7–1.3)
Gamma Glob SerPl Elph-Mcnc: 1 g/dL (ref 0.4–1.8)
Globulin, Total: 3.3 g/dL (ref 2.2–3.9)
IgA: 370 mg/dL (ref 64–422)
IgG (Immunoglobin G), Serum: 1076 mg/dL (ref 586–1602)
IgM (Immunoglobulin M), Srm: 54 mg/dL (ref 26–217)
Total Protein ELP: 7.2 g/dL (ref 6.0–8.5)

## 2021-01-21 ENCOUNTER — Other Ambulatory Visit: Payer: Self-pay | Admitting: Family Medicine

## 2021-01-21 DIAGNOSIS — K5792 Diverticulitis of intestine, part unspecified, without perforation or abscess without bleeding: Secondary | ICD-10-CM

## 2021-01-21 DIAGNOSIS — F064 Anxiety disorder due to known physiological condition: Secondary | ICD-10-CM

## 2021-01-22 NOTE — Telephone Encounter (Signed)
Ok to refill??  Last office visit 10/20/2020.  Last refill 12/05/2020.  Ok to add refills to prescription?

## 2021-01-23 ENCOUNTER — Telehealth: Payer: Self-pay | Admitting: Family Medicine

## 2021-01-23 NOTE — Telephone Encounter (Signed)
This is a new program that [facility] has started.   Any time there is a refill sent from our office, there is a text or call placed to the patient.   She requested refill on her Clonazepam.

## 2021-01-23 NOTE — Telephone Encounter (Signed)
Patient received automated message on her cell phone to check medicine prescribed by Dr. Dennard Schaumann. Patient unsure if message was intended for her or her husband. Please advise at [phone].

## 2021-01-28 NOTE — Telephone Encounter (Signed)
Patient notified

## 2021-02-04 ENCOUNTER — Ambulatory Visit
Admission: RE | Admit: 2021-02-04 | Discharge: 2021-02-04 | Disposition: A | Discharge status: Home / Self Care | Payer: Medicare Other | Source: Ambulatory Visit | Attending: Family Medicine | Admitting: Family Medicine

## 2021-02-04 ENCOUNTER — Other Ambulatory Visit: Payer: Self-pay

## 2021-02-04 DIAGNOSIS — Z780 Asymptomatic menopausal state: Secondary | ICD-10-CM

## 2021-02-04 DIAGNOSIS — M810 Age-related osteoporosis without current pathological fracture: Secondary | ICD-10-CM | POA: Diagnosis not present

## 2021-02-05 DIAGNOSIS — C44319 Basal cell carcinoma of skin of other parts of face: Secondary | ICD-10-CM | POA: Diagnosis not present

## 2021-02-15 NOTE — Progress Notes (Addendum)
Advanced Heart Failure Clinic Note   PCP: [name], MD Cardiology: Dr. [name] HF Cardiology: Dr. Aundra Dubin  86 y.o. with history of poorly controlled HTN, chronic atrial fibrillation, chronic diastolic CHF, and prior episode of Takotsubo cardiomyopathy was referred by to HF clinic by Dr. [name]. Patient has had exertional dyspnea since 11/16.  At that time, she presented to the hospital with chest pain.  Coronary angiography showed no significant CAD.  EF was 35-40% by echo, suspected Takotsubo cardiomyopathy.  Her echo in 3/17 showed EF improved to 82-64% but PA systolic pressure elevated at 65 mmHg.  We transitioned her to torsemide.   Recent echo in 12/21 showed elevation in PA pressure => EF 65-70%, normal RV, PASP 60 mmHg, mild AS, mild-moderate TR.   At recent f/u w/ Dr. Aundra Dubin 4/1, she continued to endose exertional dyspnea, seems to have worsened over the last few months.  Short of breath walking about 100 feet or walking up an incline.  Limits her activities but does take care of her husband who as dementia.  She denied orthopnea/PND. No CP. She was instrurcted to continue torsemide 60 daily and to start Jardiance 10 mg daily to help w/ diuresis. She was also referred for [facility]. This was completed 4/6 and showed nonobstructive CAD, normal RA pressure with elevated PCWP and prominent V-waves, mild pulmonary venous hypertension. Although elevated PCWP with prominent V-waves is suggestive of significant mitral regurgitation, her prior echo showed no significant MR.  It is suspected this is representative of significant LV diastolic dysfunction, raising concern for possbiel cardiac amyloidosis. She has been ordered to undergo further w/u. Multiple Myeloma panel, urine immunofixation and PYP scan ordered.   Today she returns for HF follow up. She was volume overloaded at last clinic visit, torsemide was increased. Overall feeling fine. Gets SOB with walking > 50 feet, this is her  baseline. Denies increasing SOB, CP, dizziness, edema, or PND/Orthopnea. Appetite ok. No fever or chills. Does not check weight at home. Taking all medications. sBP at home ~140s.   Labs (11/16): LDL 80 Labs (4/17): K 4.5, creatinine 1.1, BNP 132 Labs (5/17): LDL 92 Labs (3/18): K 4, creatinine 0.98, LDL 81, proBNP 834 Labs (6/18): K 4.1, creatinine 1.37, hgb 12.5 Labs (10/21): LDL 79, HDL 71 Labs (4/22): Scr 1.07, K 4.4  Labs (5/22): K 4.4, creatinine 1.34  PMH: 1. Hyperlipidemia: Myalgias with atorvastatin, Crestor, simvastatin. Myalgias with Zetia.  2. Chronic atrial fibrillation 3. HTN: Poor control.  4. H/o breast cancer: 2013.  5. GERD 6. Takotsubo cardiomyopathy: Admitted in 11/16 with chest pain.  Coronary angiography showed nonobstructive CAD.  Echo (11/16) showed EF 35-40%.  EF back to normal by 3/17 echo.  7. Chronic diastolic CHF - Echo (1/58) with EF 60-65%, mild aortic stenosis, PA systolic pressure 65 mmHg, RV mildly dilated with normal systolic function.  - Cardiolite (4/17) with EF 61%, small reversible mid anteroseptal/apical lateral defects thought to be related to shifting breast artifact => low risk study.  - Echo (12/21): EF 65-70%, normal RV, PASP 60 mmHg, mild AS, mild-moderate TR.   - R/LHC (4/22): Normal RA pressure with elevated PCWP and prominent V-waves, mild pulmonary venous HTN, Non-obstructive coronary disease. Elevated PCWP with prominent V-waves suggestive of significant MR vs. significant LV diastolic dysfunction. I suspect this is representative of significant LV diastolic dysfunction. I am concerned for possibility of cardiac amyloidosis, think patient needs workup for this. 8. Ascending aortic aneurysm: 4.3 cm by CTA  in 11/16.  9. Pulmonary hypertension: PASP 65 mmHg by echo in 3/17.  CTA chest in 11/16 with no PE.  PFTs in 7/15 with mild obstructive lung disease.  10. Sleep study negative 2017. 11. Bradycardia: off nodal blockers.  - Holter (8/20):  Average HR 51, afib 12. Aortic stenosis: Mild.   Social History   Socioeconomic History   Marital status: Married    Spouse name: Not on file   Number of children: Not on file   Years of education: Not on file   Highest education level: Not on file  Occupational History   Not on file  Tobacco Use   Smoking status: Never   Smokeless tobacco: Never  Vaping Use   Vaping Use: Never used  Substance and Sexual Activity   Alcohol use: No   Drug use: No   Sexual activity: Not Currently  Other Topics Concern   Not on file  Social History Narrative   Not on file   Social Determinants of Health   Financial Resource Strain: Not on file  Food Insecurity: Not on file  Transportation Needs: Not on file  Physical Activity: Not on file  Stress: Not on file  Social Connections: Not on file  Intimate Partner Violence: Not on file   Family History  Problem Relation Age of Onset   Heart disease Mother    Heart attack Mother    Cancer Sister        stomach and colon   Heart disease Brother 18   Hypertension Father    Cancer Sister    Stroke Neg Hx    ROS: All systems reviewed and negative except as per HPI.   Current Outpatient Medications  Medication Sig Dispense Refill   acetaminophen (TYLENOL) 500 MG tablet Take 500-1,000 mg by mouth every 6 (six) hours as needed for headache (pain).     amLODipine (NORVASC) 5 MG tablet TAKE 1 & 1/2 (ONE & ONE-HALF) TABLETS BY MOUTH ONCE DAILY 135 tablet 3   apixaban (ELIQUIS) 5 MG TABS tablet Take 1 tablet (5 mg total) by mouth 2 (two) times daily. 180 tablet 1   Ascorbic Acid (VITAMIN C) 1000 MG tablet Take 1,000 mg by mouth once a week.     Cholecalciferol (VITAMIN D3) 25 MCG (1000 UT) CAPS Take 1,000 Units by mouth once a week.     clonazePAM (KLONOPIN) 0.5 MG tablet Take 1 tablet by mouth twice daily as needed for anxiety 60 tablet 3   cloNIDine (CATAPRES) 0.1 MG tablet Take 1 tablet by mouth twice daily 180 tablet 3    clotrimazole-betamethasone (LOTRISONE) cream Apply 1 application topically 2 (two) times daily. 30 g 0   famotidine (PEPCID) 20 MG tablet Take 20 mg by mouth at bedtime.     ferrous sulfate 325 (65 FE) MG tablet Take 1 tablet (325 mg total) by mouth daily with breakfast. 90 tablet 3   losartan (COZAAR) 100 MG tablet Take 1 tablet by mouth once daily 90 tablet 2   nitroGLYCERIN (NITROSTAT) 0.4 MG SL tablet DISSOLVE ONE TABLET UNDER THE TONGUE EVERY 5 MINUTES AS NEEDED FOR CHEST PAIN.  DO NOT EXCEED A TOTAL OF 3 DOSES IN 15 MINUTES 25 tablet 0   spironolactone (ALDACTONE) 25 MG tablet Take 1 tablet (25 mg total) by mouth daily. 90 tablet 3   torsemide (DEMADEX) 20 MG tablet Take 4 tablets (80 mg total) by mouth daily. 270 tablet 3   No current facility-administered medications for this encounter.  BP (!) 144/72   Pulse (!) 56   Wt 82.6 kg (182 lb)   SpO2 97%   BMI 29.38 kg/m    Wt Readings from Last 3 Encounters:  02/17/21 82.6 kg (182 lb)  01/15/21 82 kg (180 lb 12.8 oz)  12/10/20 84.8 kg (187 lb)    PHYSICAL EXAM: General:  NAD. No resp difficulty HEENT: Normal Neck: Supple. No JVD. Carotids 2+ bilat; no bruits. No lymphadenopathy or thryomegaly appreciated. Cor: PMI nondisplaced. Irregular rate, slow & rhythm. No rubs, gallops, II/VI SEM RUSB Lungs: Clear Abdomen: Obese, soft, nontender, nondistended. No hepatosplenomegaly. No bruits or masses. Good bowel sounds. Extremities: No cyanosis, clubbing, rash, 1+ LE edema L>R Neuro: alert & oriented x 3, cranial nerves grossly intact. Moves all 4 extremities w/o difficulty. Affect pleasant.  REDs: 27%  Assessment/Plan: 1. Chronic atrial fibrillation: She is off nodal blockade with HR in 50s.  - Continue Eliquis 5 mg bid.  Check CBC today.    2. HTN: mildly elevated. HR wont allow addition of ? blocker. - Increase amlodipine to 10 mg daily (has been previously ordered but she had not increased dose). - Continue torsemide 80 mg  daily.  3. Chronic diastolic CHF: NYHA class III symptoms, slowly progressive over time.  Most recent echo in 12/21 with EF 60-65%, mild aortic stenosis, PA systolic pressure 65 mmHg, RV mildly dilated with normal systolic function.  [place] 4/22 showed elevated PCWP and prominent V-waves, mild pulmonary venous hypertension. Recent echo w/ no significant MR. Suspect RHC findings are representative of significant LV diastolic dysfunction, raising concern for posssible cardiac amyloidosis. Plan PYP scan. - MM panel and urine immunofixation WNL. - NYHA III confounded by body habitus & physical inactivity, her volume is good today. Refusing to take Jardiance due to h/o frequent UTIs.  - Continue torsemide 80 mg daily.   - Continue losartan 100 mg daily. - Continue spiro 25 mg daily. - BMET today. 4. Hyperlipidemia: She has been intolerant of statins and Zetia.     Will plan for PYP scan and follow back with Dr. Aundra Dubin in 3 months.  [place], FNP-BC 02/17/2021

## 2021-02-17 ENCOUNTER — Encounter (HOSPITAL_COMMUNITY): Payer: Self-pay

## 2021-02-17 ENCOUNTER — Other Ambulatory Visit: Payer: Self-pay

## 2021-02-17 ENCOUNTER — Ambulatory Visit (HOSPITAL_COMMUNITY)
Admission: RE | Admit: 2021-02-17 | Discharge: 2021-02-17 | Disposition: A | Discharge status: Home / Self Care | Payer: Medicare Other | Source: Ambulatory Visit | Attending: Family Medicine | Admitting: Family Medicine

## 2021-02-17 ENCOUNTER — Telehealth: Payer: Self-pay | Admitting: Family Medicine

## 2021-02-17 VITALS — BP 144/72 | HR 56 | Wt 182.0 lb

## 2021-02-17 DIAGNOSIS — I4821 Permanent atrial fibrillation: Secondary | ICD-10-CM | POA: Diagnosis not present

## 2021-02-17 DIAGNOSIS — Z8249 Family history of ischemic heart disease and other diseases of the circulatory system: Secondary | ICD-10-CM | POA: Diagnosis not present

## 2021-02-17 DIAGNOSIS — E785 Hyperlipidemia, unspecified: Secondary | ICD-10-CM | POA: Diagnosis not present

## 2021-02-17 DIAGNOSIS — I5032 Chronic diastolic (congestive) heart failure: Secondary | ICD-10-CM

## 2021-02-17 DIAGNOSIS — Z7901 Long term (current) use of anticoagulants: Secondary | ICD-10-CM | POA: Diagnosis not present

## 2021-02-17 DIAGNOSIS — I482 Chronic atrial fibrillation, unspecified: Secondary | ICD-10-CM | POA: Insufficient documentation

## 2021-02-17 DIAGNOSIS — Z79899 Other long term (current) drug therapy: Secondary | ICD-10-CM | POA: Insufficient documentation

## 2021-02-17 DIAGNOSIS — I10 Essential (primary) hypertension: Secondary | ICD-10-CM | POA: Diagnosis not present

## 2021-02-17 DIAGNOSIS — R0602 Shortness of breath: Secondary | ICD-10-CM | POA: Insufficient documentation

## 2021-02-17 DIAGNOSIS — I110 Hypertensive heart disease with heart failure: Secondary | ICD-10-CM | POA: Insufficient documentation

## 2021-02-17 DIAGNOSIS — I350 Nonrheumatic aortic (valve) stenosis: Secondary | ICD-10-CM | POA: Insufficient documentation

## 2021-02-17 LAB — BASIC METABOLIC PANEL
Anion gap: 9 (ref 5–15)
BUN: 49 mg/dL — ABNORMAL HIGH (ref 8–23)
CO2: 25 mmol/L (ref 22–32)
Calcium: 9.6 mg/dL (ref 8.9–10.3)
Chloride: 90 mmol/L — ABNORMAL LOW (ref 98–111)
Creatinine, Ser: 1.28 mg/dL — ABNORMAL HIGH (ref 0.44–1.00)
GFR, Estimated: 41 mL/min — ABNORMAL LOW (ref >60)
Glucose, Bld: 109 mg/dL — ABNORMAL HIGH (ref 70–99)
Potassium: 5.4 mmol/L — ABNORMAL HIGH (ref 3.5–5.1)
Sodium: 124 mmol/L — ABNORMAL LOW (ref 135–145)

## 2021-02-17 LAB — CBC
HCT: 30.4 % — ABNORMAL LOW (ref 36.0–46.0)
Hemoglobin: 10.1 g/dL — ABNORMAL LOW (ref 12.0–15.0)
MCH: 30.9 pg (ref 26.0–34.0)
MCHC: 33.2 g/dL (ref 30.0–36.0)
MCV: 93 fL (ref 80.0–100.0)
Platelets: 233 10*3/uL (ref 150–400)
RBC: 3.27 MIL/uL — ABNORMAL LOW (ref 3.87–5.11)
RDW: 12.7 % (ref 11.5–15.5)
WBC: 5.1 10*3/uL (ref 4.0–10.5)
nRBC: 0 % (ref 0.0–0.2)

## 2021-02-17 MED ORDER — AMLODIPINE BESYLATE 5 MG PO TABS: 10.0000 mg | ORAL_TABLET | Freq: Every day | ORAL | 3 refills | Status: DC

## 2021-02-17 NOTE — Telephone Encounter (Signed)
Left message for patient to call back and schedule Medicare Annual Wellness Visit (AWV) in office.   If not able to come in office, please offer to do virtually or by telephone.   Last AWV: 07/08/2016  Please schedule at anytime with [facility]-Nurse Health Advisor.  If any questions, please contact me at [phone]

## 2021-02-17 NOTE — Patient Instructions (Signed)
Increase Amlodipine 10 mg daily  Lab work today. We will call you with abnormal values PYP scan -office will call you to schedule. Appt to see Dr. Aundra Dubin

## 2021-02-18 ENCOUNTER — Telehealth (HOSPITAL_COMMUNITY): Payer: Self-pay | Admitting: Cardiology

## 2021-02-18 ENCOUNTER — Telehealth: Payer: Self-pay | Admitting: *Deleted

## 2021-02-18 DIAGNOSIS — I5032 Chronic diastolic (congestive) heart failure: Secondary | ICD-10-CM

## 2021-02-18 NOTE — Telephone Encounter (Signed)
Patient called.  Patient aware.[phone] (H) Repeat labs 6/23

## 2021-02-18 NOTE — Telephone Encounter (Signed)
-----   Message from Rafael Bihari, Dawn sent at 02/17/2021  4:31 PM EDT ----- Potassium too high. Stop spiro. Repeat BMET 7-10 days.

## 2021-02-18 NOTE — Telephone Encounter (Signed)
Prolia benefit verification submitted. Returned with PA required.  Call placed to [facility] to submit PA. Was advised that PA is not required if medication is not currently used for cancer related Dx.   OOP Cost noted at 20% with $218.09 maximum cost.   Call placed to patient to discuss treatment. [place].

## 2021-02-25 NOTE — Telephone Encounter (Signed)
Patient returned call and made aware.   States that co-pay is too expensive.   Please advise.

## 2021-02-25 NOTE — Telephone Encounter (Signed)
Call placed to patient. LMTRC.  

## 2021-02-26 ENCOUNTER — Ambulatory Visit (HOSPITAL_COMMUNITY)
Admission: RE | Admit: 2021-02-26 | Discharge: 2021-02-26 | Disposition: A | Discharge status: Home / Self Care | Payer: Medicare Other | Source: Ambulatory Visit | Attending: Internal Medicine | Admitting: Internal Medicine

## 2021-02-26 ENCOUNTER — Other Ambulatory Visit: Payer: Self-pay

## 2021-02-26 DIAGNOSIS — I5032 Chronic diastolic (congestive) heart failure: Secondary | ICD-10-CM | POA: Diagnosis not present

## 2021-02-26 LAB — BASIC METABOLIC PANEL
Anion gap: 8 (ref 5–15)
BUN: 67 mg/dL — ABNORMAL HIGH (ref 8–23)
CO2: 26 mmol/L (ref 22–32)
Calcium: 9 mg/dL (ref 8.9–10.3)
Chloride: 98 mmol/L (ref 98–111)
Creatinine, Ser: 1.52 mg/dL — ABNORMAL HIGH (ref 0.44–1.00)
GFR, Estimated: 34 mL/min — ABNORMAL LOW (ref >60)
Glucose, Bld: 120 mg/dL — ABNORMAL HIGH (ref 70–99)
Potassium: 4.7 mmol/L (ref 3.5–5.1)
Sodium: 132 mmol/L — ABNORMAL LOW (ref 135–145)

## 2021-02-26 MED ORDER — CALCIUM 1200 1200-1000 MG-UNIT PO CHEW: 1.0000 | CHEWABLE_TABLET | Freq: Every day | ORAL | Status: DC

## 2021-02-26 MED ORDER — ALENDRONATE SODIUM 70 MG PO TABS: 70.0000 mg | ORAL_TABLET | ORAL | 11 refills | Status: DC

## 2021-02-26 NOTE — Telephone Encounter (Signed)
Call placed to patient. LMTRC.  

## 2021-02-26 NOTE — Addendum Note (Signed)
Addended by: Sheral Flow on: 02/26/2021 04:59 PM   Modules accepted: Orders

## 2021-02-26 NOTE — Telephone Encounter (Signed)
Patient returned call and made aware.   Agreeable to plan.   Prescription sent to pharmacy.

## 2021-03-04 ENCOUNTER — Telehealth (HOSPITAL_COMMUNITY): Payer: Self-pay | Admitting: Family Medicine

## 2021-03-04 ENCOUNTER — Ambulatory Visit
Admission: RE | Admit: 2021-03-04 | Discharge: 2021-03-04 | Disposition: A | Discharge status: Home / Self Care | Payer: Medicare Other | Source: Ambulatory Visit | Attending: Nurse Practitioner | Admitting: Nurse Practitioner

## 2021-03-04 ENCOUNTER — Other Ambulatory Visit: Payer: Self-pay

## 2021-03-04 ENCOUNTER — Encounter: Payer: Self-pay | Admitting: Nurse Practitioner

## 2021-03-04 ENCOUNTER — Ambulatory Visit (INDEPENDENT_AMBULATORY_CARE_PROVIDER_SITE_OTHER): Payer: Medicare Other | Admitting: Nurse Practitioner

## 2021-03-04 VITALS — BP 162/64 | HR 55 | Temp 98.1°F | Ht 66.0 in | Wt 180.0 lb

## 2021-03-04 DIAGNOSIS — R059 Cough, unspecified: Secondary | ICD-10-CM

## 2021-03-04 MED ORDER — CLOBETASOL PROPIONATE 0.05 % EX CREA: 1.0000 "application " | TOPICAL_CREAM | CUTANEOUS | 0 refills | Status: DC | PRN

## 2021-03-04 NOTE — Telephone Encounter (Signed)
Patient called and given instructions for medication changes. She verbalizedunderstanding. Will repeat BMET at next lab appt 03/10/21.  Allena Katz, FNP-BC

## 2021-03-04 NOTE — Progress Notes (Signed)
Subjective:    Patient ID: [name], female    DOB: 03/31/1935, 86 y.o.   MRN: [id number]  HPI: [name] is a 86 y.o. female presenting for cough.  Chief Complaint  Patient presents with   soreness    Soreness in chest for 2 wks due to coughing, taking mucinex, does not seem to be working   UPPER RESPIRATORY TRACT INFECTION Patient reports she is no longer taking Pepcid and also stopped allergy medication for unknown reason.  Wondering if this is related to her cough. Onset: 2 weeks ago Fever: no Cough: yes; dry and worse when wakes up in the am Shortness of breath: yes Wheezing: no Chest pain: no Chest tightness: yes Chest congestion: yes; white/yellow Nasal congestion: no Runny nose: yes Post nasal drip: no Sneezing: no Sore throat: yes Swollen glands: no Sinus pressure: yes Headache: no Face pain: no Toothache: no Ear pain: yes; right Ear pressure: no  Eyes red/itching: yes Eye drainage/crusting: no  Nausea: no  Vomiting: no Diarrhea: no  Change in appetite: no  Loss of taste/smell: no  Rash: no Fatigue: no Sick contacts: no Strep contacts: no  Context: stable Treatments attempted: Mucinex Relief with OTC medications: no  Of note, patient is following closely with heart failure clinic for ongoing fluid overload coupled with chronic kidney disease and titrating of fluid pill.  Next appointment is next week.  Allergies  Allergen Reactions   Contrast Media [Iodinated Diagnostic Agents] Hives and Other (See Comments)    Per pt strong burning sensation starting in chest radiating outward    Clonidine Derivatives Other (See Comments)    Throat dry   Statins Other (See Comments)    "bones hurt"   Sulfa Antibiotics Diarrhea    Tremors    Celebrex [Celecoxib] Rash   Isosorbide Nitrate Itching and Rash   Other Itching and Rash    Plastic and paper tape and heart monitor pads   Tape Itching and Rash    Red Where applied and will spread     Outpatient Encounter Medications as of 03/04/2021  Medication Sig Note   acetaminophen (TYLENOL) 500 MG tablet Take 500-1,000 mg by mouth every 6 (six) hours as needed for headache (pain).    amLODipine (NORVASC) 5 MG tablet Take 2 tablets (10 mg total) by mouth daily.    apixaban (ELIQUIS) 5 MG TABS tablet Take 1 tablet (5 mg total) by mouth 2 (two) times daily.    Ascorbic Acid (VITAMIN C) 1000 MG tablet Take 1,000 mg by mouth once a week.    Cholecalciferol (VITAMIN D3) 25 MCG (1000 UT) CAPS Take 1,000 Units by mouth once a week.    clonazePAM (KLONOPIN) 0.5 MG tablet Take 1 tablet by mouth twice daily as needed for anxiety    cloNIDine (CATAPRES) 0.1 MG tablet Take 1 tablet by mouth twice daily    clotrimazole-betamethasone (LOTRISONE) cream Apply 1 application topically 2 (two) times daily.    famotidine (PEPCID) 20 MG tablet Take 20 mg by mouth at bedtime.    ferrous sulfate 325 (65 FE) MG tablet Take 1 tablet (325 mg total) by mouth daily with breakfast.    losartan (COZAAR) 100 MG tablet Take 1 tablet by mouth once daily    nitroGLYCERIN (NITROSTAT) 0.4 MG SL tablet DISSOLVE ONE TABLET UNDER THE TONGUE EVERY 5 MINUTES AS NEEDED FOR CHEST PAIN.  DO NOT EXCEED A TOTAL OF 3 DOSES IN 15 MINUTES 01/15/2021: Took this afternoon for  chest pain   torsemide (DEMADEX) 20 MG tablet Take 4 tablets (80 mg total) by mouth daily.    [DISCONTINUED] Calcium Carbonate-Vit D-Min (CALCIUM 1200) 1200-1000 MG-UNIT CHEW Chew 1 tablet by mouth daily.    alendronate (FOSAMAX) 70 MG tablet Take 1 tablet (70 mg total) by mouth every 7 (seven) days. Take with a full glass of water on an empty stomach. (Patient not taking: Reported on 03/04/2021)    clobetasol cream (TEMOVATE) 5.70 % Apply 1 application topically as needed.    No facility-administered encounter medications on file as of 03/04/2021.    Patient Active Problem List   Diagnosis Date Noted   Postmenopausal bleeding 06/05/2020   Chest pain  07/23/2019   Diverticulitis large intestine w/o perforation or abscess w/o bleeding 09/04/2018   Colovesical fistula    Preoperative clearance 08/07/2018   TIA (transient ischemic attack) 01/06/2018   Breast cancer ([place]) 09/13/2017   Obesity, unspecified 09/13/2017   Warfarin anticoagulation 09/13/2017   Uterine mass 09/07/2017   Adnexal mass 08/03/2017   Pelvic abscess in female 07/07/2017   Aortic stenosis    Dyslipidemia, goal LDL below 70 11/28/2016   Monitoring for long-term anticoagulant use 12/31/2015   Chronic diastolic CHF (congestive heart failure) ([place]) 10/28/2015   Insomnia 10/23/2015   Leg swelling 08/11/2015   Takotsubo syndrome 07/29/2015   DOE (dyspnea on exertion) 08/15/2014   Dilated aortic root (HCC)    Pulmonary HTN ([place])    Permanent atrial fibrillation ([place]) 07/04/2013   Chronic anticoagulation 07/04/2013   Coronary artery disease    Lesion of nipple 05/15/2013   GERD (gastroesophageal reflux disease)    Hypertension    COPD (chronic obstructive pulmonary disease) ([place])    Osteopenia    Diverticulosis    Hiatal hernia    Abdominal pain 01/11/2012   Breast cancer of upper-outer quadrant of left female breast ([place]) 01/06/2012   Colon cancer ([place]) 01/06/2012    Past Medical History:  Diagnosis Date   Allergy    rhinitis   Aortic stenosis    mild by echo 06/2017   Arthritis    Bradycardia    a. 10/2017 -> beta blocker cut back due to HR 39.   Breast cancer ([place]) 01/06/2012   Cancer ([place])    right colon and left breast   Chronic diastolic CHF (congestive heart failure) ([place])    Colon cancer ([place]) 01/06/2012   Colovesical fistula    Dr. Marlou Starks and [name] planning surgery 08/2018- surgery revealed spontaneous closure   COPD (chronic obstructive pulmonary disease) ([place])    pt. denies   Coronary artery disease 2006   a.  NSTEMI in 2016, cath showed 15% prox-mid RCA, 20% prox LAD, EF 25-35% by cath and 35-40% -> felt due to Takotsubo cardiomyopathy.    Dilated aortic root ([place])    80mmHg by echo 06/2017   Diverticulosis    Dyspnea    Edema extremities    GERD (gastroesophageal reflux disease)    Hernia    Hiatal hernia    denies   Hyperlipidemia    Hypertension    Mild aortic stenosis    echo 11/2015 but not noted on echo 06/2016   Osteopenia    Permanent atrial fibrillation (HCC)    chronic atrial fibrillation   Pneumonia    hx child   Pulmonary HTN ([employer])    a. moderate to severe PASP 3mmHg echo 11/2015 - now 23mmHg by echo 06/2017. CTA chest in 11/16 with no PE.  PFTs in 7/15 with mild obstructive lung disease. She had a negative sleep study in 2017. b. Felt due to left sided HF.   Stroke [facility])    Takotsubo syndrome 07/29/2015   a. EF 35-40% by echo; akinesis of mid-apical anteroseptal and apical myocardium.  EF now normalized on echo 11/2015    Relevant past medical, surgical, family and social history reviewed and updated as indicated. Interim medical history since our last visit reviewed.  Review of Systems Per HPI unless specifically indicated above     Objective:    BP (!) 162/64   Pulse (!) 55   Temp 98.1 F (36.7 C)   Ht 5\' 6"  (1.676 m)   Wt 180 lb (81.6 kg)   SpO2 95%   BMI 29.05 kg/m   Wt Readings from Last 3 Encounters:  03/04/21 180 lb (81.6 kg)  02/17/21 182 lb (82.6 kg)  01/15/21 180 lb 12.8 oz (82 kg)    Physical Exam Vitals and nursing note reviewed.  Constitutional:      General: She is not in acute distress.    Appearance: Normal appearance. She is not toxic-appearing.  HENT:     Head: Normocephalic and atraumatic.     Nose: Nose normal. No congestion.     Mouth/Throat:     Mouth: Mucous membranes are moist.     Pharynx: Oropharynx is clear. Posterior oropharyngeal erythema present.  Eyes:     General: No scleral icterus.    Extraocular Movements: Extraocular movements intact.  Cardiovascular:     Rate and Rhythm: Normal rate.     Heart sounds: Murmur heard.  Pulmonary:     Effort:  Pulmonary effort is normal. No respiratory distress.     Breath sounds: Normal breath sounds. No wheezing, rhonchi or rales.  Abdominal:     General: Abdomen is flat.     Palpations: Abdomen is soft.  Musculoskeletal:     Right lower leg: No edema.     Left lower leg: No edema.  Neurological:     Mental Status: She is alert and oriented to person, place, and time.     Motor: No weakness.     Gait: Gait normal.  Psychiatric:        Mood and Affect: Mood normal.        Behavior: Behavior normal.        Thought Content: Thought content normal.        Judgment: Judgment normal.      Assessment & Plan:  1. Cough Acute.  Given length of symptoms, differentials include walking pneumonia, GERD, fluid overload, allergies.  Appears euvolemic on exam today and lung sounds are clear to auscultation-fluid overload seems unlikely.  We will check chest x-ray today to rule out pulmonary edema and pneumonia, although oxygen saturation is normal today.  Her elevated blood pressure is likely because she has not taken her diuretic yet today, however she does states she is going to take it when she gets home.  Encouraged her to resume famotidine nightly as well as allergy regimen and see if this helps with her cough.  I think it is unlikely that is infectious, however I will get an x-ray to be sure.  Continue collaboration with heart failure clinic as well.  - DG Chest 2 View; Future     Follow up plan: Return if symptoms worsen or fail to improve.

## 2021-03-05 ENCOUNTER — Telehealth: Payer: Self-pay

## 2021-03-05 MED ORDER — BENZONATATE 100 MG PO CAPS: 100.0000 mg | ORAL_CAPSULE | Freq: Two times a day (BID) | ORAL | 0 refills | Status: DC | PRN

## 2021-03-06 NOTE — Telephone Encounter (Signed)
Sent in [place] for cough.  Please make her aware.  Please also ensure she has started back famotidine at night time.

## 2021-03-10 ENCOUNTER — Other Ambulatory Visit: Payer: Self-pay

## 2021-03-10 ENCOUNTER — Ambulatory Visit (HOSPITAL_COMMUNITY)
Admission: RE | Admit: 2021-03-10 | Discharge: 2021-03-10 | Disposition: A | Discharge status: Home / Self Care | Payer: Medicare Other | Source: Ambulatory Visit | Attending: Cardiology | Admitting: Cardiology

## 2021-03-10 DIAGNOSIS — I5032 Chronic diastolic (congestive) heart failure: Secondary | ICD-10-CM | POA: Insufficient documentation

## 2021-03-10 LAB — BASIC METABOLIC PANEL
Anion gap: 4 — ABNORMAL LOW (ref 5–15)
BUN: 50 mg/dL — ABNORMAL HIGH (ref 8–23)
CO2: 29 mmol/L (ref 22–32)
Calcium: 9.5 mg/dL (ref 8.9–10.3)
Chloride: 102 mmol/L (ref 98–111)
Creatinine, Ser: 1.19 mg/dL — ABNORMAL HIGH (ref 0.44–1.00)
GFR, Estimated: 45 mL/min — ABNORMAL LOW (ref >60)
Glucose, Bld: 110 mg/dL — ABNORMAL HIGH (ref 70–99)
Potassium: 4.4 mmol/L (ref 3.5–5.1)
Sodium: 135 mmol/L (ref 135–145)

## 2021-03-11 DIAGNOSIS — Z1231 Encounter for screening mammogram for malignant neoplasm of breast: Secondary | ICD-10-CM | POA: Diagnosis not present

## 2021-03-11 LAB — HM MAMMOGRAPHY

## 2021-03-25 ENCOUNTER — Encounter: Payer: Self-pay | Admitting: *Deleted

## 2021-03-26 ENCOUNTER — Other Ambulatory Visit: Payer: Self-pay

## 2021-03-26 ENCOUNTER — Encounter: Payer: Self-pay | Admitting: Family Medicine

## 2021-03-26 ENCOUNTER — Ambulatory Visit (INDEPENDENT_AMBULATORY_CARE_PROVIDER_SITE_OTHER): Payer: Medicare Other | Admitting: Family Medicine

## 2021-03-26 VITALS — BP 148/78 | HR 60 | Temp 98.3°F | Resp 16 | Ht 66.0 in | Wt 179.0 lb

## 2021-03-26 DIAGNOSIS — I5189 Other ill-defined heart diseases: Secondary | ICD-10-CM | POA: Diagnosis not present

## 2021-03-26 DIAGNOSIS — I5032 Chronic diastolic (congestive) heart failure: Secondary | ICD-10-CM

## 2021-03-26 DIAGNOSIS — I272 Pulmonary hypertension, unspecified: Secondary | ICD-10-CM | POA: Diagnosis not present

## 2021-03-26 NOTE — Progress Notes (Signed)
Subjective:    Patient ID: [name], female    DOB: 1935-06-07, 86 y.o.   MRN: [id number] Patient presents today with shortness of breath with minimal activity.  She states that she becomes short of breath simply walking 50 or 60 feet.  She also feels lightheaded at times and has to rest due to feeling weak and short of breath.  She denies any chest pain.  Review of her chart shows that she is being seen by the advanced heart failure clinic due to pulmonary hypertension as well as diastolic dysfunction.  Patient underwent a catheterization which showed significant diastolic dysfunction as well as increased pulmonary capillary wedge pressure.  Cardiology has increased her torsemide and wanted to focus on blood pressure control to try to improve her symptomatology.  They had also recommended adding Jardiance but the patient refused the medication.  She is currently being worked up for amyloidosis given the significant diastolic dysfunction.  Spironolactone 25 mg a day was added as well.  Today on examination she has trace bipedal edema.  She has some faint right basilar crackles.  Otherwise she does not appear to be fluid overloaded on exam Past Medical History:  Diagnosis Date   Allergy    rhinitis   Aortic stenosis    mild by echo 06/2017   Arthritis    Bradycardia    a. 10/2017 -> beta blocker cut back due to HR 39.   Breast cancer ([place]) 01/06/2012   Cancer ([place])    right colon and left breast   Chronic diastolic CHF (congestive heart failure) ([place])    Colon cancer ([place]) 01/06/2012   Colovesical fistula    Dr. Marlou Starks and [name] planning surgery 08/2018- surgery revealed spontaneous closure   COPD (chronic obstructive pulmonary disease) ([place])    pt. denies   Coronary artery disease 2006   a.  NSTEMI in 2016, cath showed 15% prox-mid RCA, 20% prox LAD, EF 25-35% by cath and 35-40% -> felt due to Takotsubo cardiomyopathy.   Dilated aortic root ([place])    72mmHg by echo 06/2017   Diverticulosis     Dyspnea    Edema extremities    GERD (gastroesophageal reflux disease)    Hernia    Hiatal hernia    denies   Hyperlipidemia    Hypertension    Mild aortic stenosis    echo 11/2015 but not noted on echo 06/2016   Osteopenia    Permanent atrial fibrillation (HCC)    chronic atrial fibrillation   Pneumonia    hx child   Pulmonary HTN ([place])    a. moderate to severe PASP 76mmHg echo 11/2015 - now 63mmHg by echo 06/2017. CTA chest in 11/16 with no PE. PFTs in 7/15 with mild obstructive lung disease. She had a negative sleep study in 2017. b. Felt due to left sided HF.   Stroke [facility])    Takotsubo syndrome 07/29/2015   a. EF 35-40% by echo; akinesis of mid-apical anteroseptal and apical myocardium.  EF now normalized on echo 11/2015   Past Surgical History:  Procedure Laterality Date   APPENDECTOMY     BREAST SURGERY     lumpectomy left   CARDIAC CATHETERIZATION     CARDIAC CATHETERIZATION N/A 07/28/2015   Procedure: Left Heart Cath and Coronary Angiography;  Surgeon: Peter M Martinique, MD;  Location: [facility];  Service: Cardiovascular;  Laterality: N/A;   CHOLECYSTECTOMY     COLECTOMY     right side  CYSTOSCOPY WITH STENT PLACEMENT Left 09/04/2018   Procedure: CYSTOSCOPY WITH LEFT STENT PLACEMENT, BLADDER REPAIR, CYSTOSCOPY WITH LEFT STENT REMOVAL;  Surgeon: Raynelle Bring, MD;  Location: [facility];  Service: Urology;  Laterality: Left;   EXCISION OF ACCESSORY NIPPLE Bilateral 05/30/2013   Procedure: BILATERAL NIPPLE BIOPSY;  Surgeon: Merrie Roof, MD;  Location: [facility];  Service: General;  Laterality: Bilateral;   EYE SURGERY Bilateral 12   cataracts   HYSTEROSCOPY WITH D & C N/A 05/29/2020   Procedure: DILATATION AND CURETTAGE /HYSTEROSCOPY;  Surgeon: Homero Fellers, MD;  Location: [facility];  Service: Gynecology;  Laterality: N/A;   IR RADIOLOGIST EVAL & MGMT  07/26/2017   LAPAROSCOPIC RIGHT COLECTOMY N/A 09/04/2018   Procedure: LAPAROSCOPIC ASSISTED SIGMOID  COLECTOMY WITH REPAIR OF FISTULA TO BLADDER;  Surgeon: Jovita Kussmaul, MD;  Location: [facility];  Service: General;  Laterality: N/A;   RIGHT/LEFT HEART CATH AND CORONARY ANGIOGRAPHY N/A 12/10/2020   Procedure: RIGHT/LEFT HEART CATH AND CORONARY ANGIOGRAPHY;  Surgeon: Larey Dresser, MD;  Location: [facility];  Service: Cardiovascular;  Laterality: N/A;   SPLIT NIGHT STUDY  02/02/2016       Current Outpatient Medications on File Prior to Visit  Medication Sig Dispense Refill   acetaminophen (TYLENOL) 500 MG tablet Take 500-1,000 mg by mouth every 6 (six) hours as needed for headache (pain).     alendronate (FOSAMAX) 70 MG tablet Take 1 tablet (70 mg total) by mouth every 7 (seven) days. Take with a full glass of water on an empty stomach. 4 tablet 11   amLODipine (NORVASC) 5 MG tablet Take 2 tablets (10 mg total) by mouth daily. 135 tablet 3   apixaban (ELIQUIS) 5 MG TABS tablet Take 1 tablet (5 mg total) by mouth 2 (two) times daily. 180 tablet 1   Ascorbic Acid (VITAMIN C) 1000 MG tablet Take 1,000 mg by mouth once a week.     benzonatate (TESSALON) 100 MG capsule Take 1 capsule (100 mg total) by mouth 2 (two) times daily as needed for cough. Take first dose at night time and monitor for drowsiness.  If this medication makes you drowsy, do not take while driving or operating heavy machinery. 20 capsule 0   Cholecalciferol (VITAMIN D3) 25 MCG (1000 UT) CAPS Take 1,000 Units by mouth once a week.     clobetasol cream (TEMOVATE) 3.55 % Apply 1 application topically as needed. 30 g 0   clonazePAM (KLONOPIN) 0.5 MG tablet Take 1 tablet by mouth twice daily as needed for anxiety 60 tablet 3   cloNIDine (CATAPRES) 0.1 MG tablet Take 1 tablet by mouth twice daily 180 tablet 3   clotrimazole-betamethasone (LOTRISONE) cream Apply 1 application topically 2 (two) times daily. 30 g 0   famotidine (PEPCID) 20 MG tablet Take 20 mg by mouth at bedtime.     ferrous sulfate 325 (65 FE) MG tablet Take 1  tablet (325 mg total) by mouth daily with breakfast. 90 tablet 3   losartan (COZAAR) 100 MG tablet Take 1 tablet by mouth once daily 90 tablet 2   nitroGLYCERIN (NITROSTAT) 0.4 MG SL tablet DISSOLVE ONE TABLET UNDER THE TONGUE EVERY 5 MINUTES AS NEEDED FOR CHEST PAIN.  DO NOT EXCEED A TOTAL OF 3 DOSES IN 15 MINUTES 25 tablet 0   spironolactone (ALDACTONE) 25 MG tablet Take 25 mg by mouth daily.     torsemide (DEMADEX) 20 MG tablet Take 4 tablets (80 mg total) by mouth  daily. (Patient taking differently: Take 60 mg by mouth daily.) 270 tablet 3   No current facility-administered medications on file prior to visit.   Allergies  Allergen Reactions   Contrast Media [Iodinated Diagnostic Agents] Hives and Other (See Comments)    Per pt strong burning sensation starting in chest radiating outward    Clonidine Derivatives Other (See Comments)    Throat dry   Statins Other (See Comments)    "bones hurt"   Sulfa Antibiotics Diarrhea    Tremors    Celebrex [Celecoxib] Rash   Isosorbide Nitrate Itching and Rash   Other Itching and Rash    Plastic and paper tape and heart monitor pads   Tape Itching and Rash    Red Where applied and will spread   Social History   Socioeconomic History   Marital status: Married    Spouse name: Not on file   Number of children: Not on file   Years of education: Not on file   Highest education level: Not on file  Occupational History   Not on file  Tobacco Use   Smoking status: Never   Smokeless tobacco: Never  Vaping Use   Vaping Use: Never used  Substance and Sexual Activity   Alcohol use: No   Drug use: No   Sexual activity: Not Currently  Other Topics Concern   Not on file  Social History Narrative   Not on file   Social Determinants of Health   Financial Resource Strain: Not on file  Food Insecurity: Not on file  Transportation Needs: Not on file  Physical Activity: Not on file  Stress: Not on file  Social Connections: Not on file   Intimate Partner Violence: Not on file      Review of Systems  Musculoskeletal:  Positive for back pain.  All other systems reviewed and are negative.     Objective:   Physical Exam Vitals reviewed.  Constitutional:      General: She is not in acute distress.    Appearance: Normal appearance. She is well-developed and normal weight. She is not diaphoretic.  HENT:     Head: Normocephalic and atraumatic.     Right Ear: Tympanic membrane and ear canal normal.     Left Ear: Tympanic membrane and ear canal normal.     Nose: Nose normal. No congestion or rhinorrhea.     Mouth/Throat:     Mouth: Mucous membranes are moist.     Pharynx: No oropharyngeal exudate or posterior oropharyngeal erythema.  Eyes:     General:        Right eye: No discharge.        Left eye: No discharge.     Extraocular Movements: Extraocular movements intact.     Conjunctiva/sclera: Conjunctivae normal.     Pupils: Pupils are equal, round, and reactive to light.  Neck:     Thyroid: No thyromegaly.     Vascular: No carotid bruit or JVD.  Cardiovascular:     Rate and Rhythm: Normal rate and regular rhythm.     Heart sounds: Murmur heard.    No friction rub. No gallop.  Pulmonary:     Effort: Pulmonary effort is normal. No respiratory distress.     Breath sounds: Normal breath sounds. No stridor. No wheezing, rhonchi or rales.  Chest:     Chest wall: No tenderness.  Abdominal:     General: Bowel sounds are normal. There is no distension.     Palpations:  Abdomen is not rigid.     Tenderness: There is no abdominal tenderness. There is no guarding or rebound.     Hernia: No hernia is present.  Musculoskeletal:     Cervical back: Neck supple. No rigidity or tenderness.     Right lower leg: Edema present.     Left lower leg: Edema present.  Lymphadenopathy:     Cervical: No cervical adenopathy.  Skin:    General: Skin is warm.     Findings: No bruising, erythema, lesion or rash.  Neurological:      General: No focal deficit present.     Mental Status: She is alert and oriented to person, place, and time. Mental status is at baseline.     Cranial Nerves: No cranial nerve deficit.     Motor: No weakness or abnormal muscle tone.     Coordination: Coordination normal.     Gait: Gait normal.     Deep Tendon Reflexes: Reflexes normal.  Psychiatric:        Mood and Affect: Mood normal.        Behavior: Behavior normal.        Thought Content: Thought content normal.        Judgment: Judgment normal.          Assessment & Plan:  Pulmonary hypertension (HCC)  Diastolic dysfunction  Chronic diastolic CHF (congestive heart failure) ([place]) I believe the majority of her shortness of breath and weakness is due to diastolic dysfunction causing decreased cardiac output.  As a result I will increase the dose of her aldosterone antagonist.  Increase spironolactone to 25 mg twice a day and then recheck in 2 weeks to monitor renal function and potassium.  Also discussed possibly adding Jardiance but the patient declines due to the risk of urinary tract infections.  She has preserved ejection fraction and therefore she would not qualify for [employer]

## 2021-04-09 ENCOUNTER — Other Ambulatory Visit (HOSPITAL_COMMUNITY): Payer: Self-pay | Admitting: Cardiology

## 2021-04-09 ENCOUNTER — Other Ambulatory Visit: Payer: Self-pay

## 2021-04-09 ENCOUNTER — Ambulatory Visit (INDEPENDENT_AMBULATORY_CARE_PROVIDER_SITE_OTHER): Payer: Medicare Other | Admitting: Family Medicine

## 2021-04-09 VITALS — BP 138/64 | HR 76 | Temp 98.1°F | Resp 14 | Ht 66.0 in | Wt 178.0 lb

## 2021-04-09 DIAGNOSIS — I5189 Other ill-defined heart diseases: Secondary | ICD-10-CM

## 2021-04-09 DIAGNOSIS — E854 Organ-limited amyloidosis: Secondary | ICD-10-CM

## 2021-04-09 DIAGNOSIS — I43 Cardiomyopathy in diseases classified elsewhere: Secondary | ICD-10-CM

## 2021-04-09 DIAGNOSIS — I5032 Chronic diastolic (congestive) heart failure: Secondary | ICD-10-CM

## 2021-04-09 DIAGNOSIS — I272 Pulmonary hypertension, unspecified: Secondary | ICD-10-CM | POA: Diagnosis not present

## 2021-04-09 MED ORDER — FLUTICASONE PROPIONATE 50 MCG/ACT NA SUSP: 2.0000 | Freq: Every day | NASAL | 6 refills | Status: DC

## 2021-04-09 NOTE — Progress Notes (Signed)
Subjective:    Patient ID: [name], female    DOB: 1935/04/09, 86 y.o.   MRN: [id number] 03/26/21 Patient presents today with shortness of breath with minimal activity.  She states that she becomes short of breath simply walking 50 or 60 feet.  She also feels lightheaded at times and has to rest due to feeling weak and short of breath.  She denies any chest pain.  Review of her chart shows that she is being seen by the advanced heart failure clinic due to pulmonary hypertension as well as diastolic dysfunction.  Patient underwent a catheterization which showed significant diastolic dysfunction as well as increased pulmonary capillary wedge pressure.  Cardiology has increased her torsemide and wanted to focus on blood pressure control to try to improve her symptomatology.  They had also recommended adding Jardiance but the patient refused the medication.  She is currently being worked up for amyloidosis given the significant diastolic dysfunction.  Spironolactone 25 mg a day was added as well.  Today on examination she has trace bipedal edema.  She has some faint right basilar crackles.  Otherwise she does not appear to be fluid overloaded on exam.  At that time, my plan was:  I believe the majority of her shortness of breath and weakness is due to diastolic dysfunction causing decreased cardiac output.  As a result I will increase the dose of her aldosterone antagonist.  Increase spironolactone to 25 mg twice a day and then recheck in 2 weeks to monitor renal function and potassium.  Also discussed possibly adding Jardiance but the patient declines due to the risk of urinary tract infections.  She has preserved ejection fraction and therefore she would not qualify for Entresto  04/09/21 Despite increasing her spironolactone, the patient states that her breathing has not improved at all.  Her weight is essentially the same.  She still reports increasing dyspnea on exertion, early fatigability, generalized  weakness and fatigue, as well as a cough.  She denies any angina.  Murmur over the aortic valve seems much louder than I would anticipate for mild aortic sclerosis is seen on her last echocardiogram from December.  She denies any syncope or near syncope but she does report lightheadedness Past Medical History:  Diagnosis Date   Allergy    rhinitis   Aortic stenosis    mild by echo 06/2017   Arthritis    Bradycardia    a. 10/2017 -> beta blocker cut back due to HR 39.   Breast cancer ([place]) 01/06/2012   Cancer ([place])    right colon and left breast   Chronic diastolic CHF (congestive heart failure) ([place])    Colon cancer ([place]) 01/06/2012   Colovesical fistula    Dr. Marlou Starks and [name] planning surgery 08/2018- surgery revealed spontaneous closure   COPD (chronic obstructive pulmonary disease) ([place])    pt. denies   Coronary artery disease 2006   a.  NSTEMI in 2016, cath showed 15% prox-mid RCA, 20% prox LAD, EF 25-35% by cath and 35-40% -> felt due to Takotsubo cardiomyopathy.   Dilated aortic root ([place])    25mHg by echo 06/2017   Diverticulosis    Dyspnea    Edema extremities    GERD (gastroesophageal reflux disease)    Hernia    Hiatal hernia    denies   Hyperlipidemia    Hypertension    Mild aortic stenosis    echo 11/2015 but not noted on echo 06/2016   Osteopenia  Permanent atrial fibrillation (HCC)    chronic atrial fibrillation   Pneumonia    hx child   Pulmonary HTN ([place])    a. moderate to severe PASP 27mHg echo 11/2015 - now 443mg by echo 06/2017. CTA chest in 11/16 with no PE. PFTs in 7/15 with mild obstructive lung disease. She had a negative sleep study in 2017. b. Felt due to left sided HF.   [facility]   Takotsubo syndrome 07/29/2015   a. EF 35-40% by echo; akinesis of mid-apical anteroseptal and apical myocardium.  EF now normalized on echo 11/2015   Past Surgical History:  Procedure Laterality Date   APPENDECTOMY     BREAST SURGERY     lumpectomy left    CARDIAC CATHETERIZATION     CARDIAC CATHETERIZATION N/A 07/28/2015   Procedure: Left Heart Cath and Coronary Angiography;  Surgeon: Peter M JoMartiniqueMD;  [facility];  Service: Cardiovascular;  Laterality: N/A;   CHOLECYSTECTOMY     COLECTOMY     right side   CYSTOSCOPY WITH STENT PLACEMENT Left 09/04/2018   Procedure: CYSTOSCOPY WITH LEFT STENT PLACEMENT, BLADDER REPAIR, CYSTOSCOPY WITH LEFT STENT REMOVAL;  Surgeon: BoRaynelle BringMD;  [facility];  Service: Urology;  Laterality: Left;   EXCISION OF ACCESSORY NIPPLE Bilateral 05/30/2013   Procedure: BILATERAL NIPPLE BIOPSY;  Surgeon: PaMerrie RoofMD;  [facility]: General;  Laterality: Bilateral;   EYE SURGERY Bilateral 12   cataracts   HYSTEROSCOPY WITH D & C N/A 05/29/2020   Procedure: DILATATION AND CURETTAGE /HYSTEROSCOPY;  Surgeon: ScHomero FellersMD;  Location: [facility];  Service: Gynecology;  Laterality: N/A;   IR RADIOLOGIST EVAL & MGMT  07/26/2017   LAPAROSCOPIC RIGHT COLECTOMY N/A 09/04/2018   Procedure: LAPAROSCOPIC ASSISTED SIGMOID COLECTOMY WITH REPAIR OF FISTULA TO BLADDER;  Surgeon: ToJovita KussmaulMD;  [facility];  Service: General;  Laterality: N/A;   RIGHT/LEFT HEART CATH AND CORONARY ANGIOGRAPHY N/A 12/10/2020   Procedure: RIGHT/LEFT HEART CATH AND CORONARY ANGIOGRAPHY;  Surgeon: McLarey DresserMD;  [facility];  Service: Cardiovascular;  Laterality: N/A;   SPLIT NIGHT STUDY  02/02/2016       Current Outpatient Medications on File Prior to Visit  Medication Sig Dispense Refill   acetaminophen (TYLENOL) 500 MG tablet Take 500-1,000 mg by mouth every 6 (six) hours as needed for headache (pain).     alendronate (FOSAMAX) 70 MG tablet Take 1 tablet (70 mg total) by mouth every 7 (seven) days. Take with a full glass of water on an empty stomach. 4 tablet 11   amLODipine (NORVASC) 5 MG tablet Take 2 tablets (10 mg total) by mouth daily. 135 tablet 3    apixaban (ELIQUIS) 5 MG TABS tablet Take 1 tablet (5 mg total) by mouth 2 (two) times daily. 180 tablet 1   Ascorbic Acid (VITAMIN C) 1000 MG tablet Take 1,000 mg by mouth once a week.     benzonatate (TESSALON) 100 MG capsule Take 1 capsule (100 mg total) by mouth 2 (two) times daily as needed for cough. Take first dose at night time and monitor for drowsiness.  If this medication makes you drowsy, do not take while driving or operating heavy machinery. 20 capsule 0   Cholecalciferol (VITAMIN D3) 25 MCG (1000 UT) CAPS Take 1,000 Units by mouth once a week.     clobetasol cream (TEMOVATE) 0.[id number] Apply 1 application topically as needed.  30 g 0   clonazePAM (KLONOPIN) 0.5 MG tablet Take 1 tablet by mouth twice daily as needed for anxiety 60 tablet 3   cloNIDine (CATAPRES) 0.1 MG tablet Take 1 tablet by mouth twice daily 180 tablet 3   clotrimazole-betamethasone (LOTRISONE) cream Apply 1 application topically 2 (two) times daily. 30 g 0   famotidine (PEPCID) 20 MG tablet Take 20 mg by mouth at bedtime.     ferrous sulfate 325 (65 FE) MG tablet Take 1 tablet (325 mg total) by mouth daily with breakfast. 90 tablet 3   losartan (COZAAR) 100 MG tablet Take 1 tablet by mouth once daily 90 tablet 2   nitroGLYCERIN (NITROSTAT) 0.4 MG SL tablet DISSOLVE ONE TABLET UNDER THE TONGUE EVERY 5 MINUTES AS NEEDED FOR CHEST PAIN.  DO NOT EXCEED A TOTAL OF 3 DOSES IN 15 MINUTES 25 tablet 0   spironolactone (ALDACTONE) 25 MG tablet Take 25 mg by mouth 2 (two) times daily.     torsemide (DEMADEX) 20 MG tablet Take 4 tablets (80 mg total) by mouth daily. (Patient taking differently: Take 60 mg by mouth daily.) 270 tablet 3   No current facility-administered medications on file prior to visit.   Allergies  Allergen Reactions   Contrast Media [Iodinated Diagnostic Agents] Hives and Other (See Comments)    Per pt strong burning sensation starting in chest radiating outward    Clonidine Derivatives Other (See Comments)     Throat dry   Statins Other (See Comments)    "bones hurt"   Sulfa Antibiotics Diarrhea    Tremors    Celebrex [Celecoxib] Rash   Isosorbide Nitrate Itching and Rash   Other Itching and Rash    Plastic and paper tape and heart monitor pads   Tape Itching and Rash    Red Where applied and will spread   Social History   Socioeconomic History   Marital status: Married    Spouse name: Not on file   Number of children: Not on file   Years of education: Not on file   Highest education level: Not on file  Occupational History   Not on file  Tobacco Use   Smoking status: Never   Smokeless tobacco: Never  Vaping Use   Vaping Use: Never used  Substance and Sexual Activity   Alcohol use: No   Drug use: No   Sexual activity: Not Currently  Other Topics Concern   Not on file  Social History Narrative   Not on file   Social Determinants of Health   Financial Resource Strain: Not on file  Food Insecurity: Not on file  Transportation Needs: Not on file  Physical Activity: Not on file  Stress: Not on file  Social Connections: Not on file  Intimate Partner Violence: Not on file      Review of Systems  Musculoskeletal:  Positive for back pain.  All other systems reviewed and are negative.     Objective:   Physical Exam Vitals reviewed.  Constitutional:      General: She is not in acute distress.    Appearance: Normal appearance. She is well-developed and normal weight. She is not diaphoretic.  HENT:     Head: Normocephalic and atraumatic.     Right Ear: Tympanic membrane and ear canal normal.     Left Ear: Tympanic membrane and ear canal normal.     Nose: Nose normal. No congestion or rhinorrhea.     Mouth/Throat:     Mouth:  Mucous membranes are moist.     Pharynx: No oropharyngeal exudate or posterior oropharyngeal erythema.  Eyes:     General:        Right eye: No discharge.        Left eye: No discharge.     Extraocular Movements: Extraocular movements  intact.     Conjunctiva/sclera: Conjunctivae normal.     Pupils: Pupils are equal, round, and reactive to light.  Neck:     Thyroid: No thyromegaly.     Vascular: No carotid bruit or JVD.  Cardiovascular:     Rate and Rhythm: Normal rate and regular rhythm.     Heart sounds: Murmur heard.    No friction rub. No gallop.  Pulmonary:     Effort: Pulmonary effort is normal. No respiratory distress.     Breath sounds: Normal breath sounds. No stridor. No wheezing, rhonchi or rales.  Chest:     Chest wall: No tenderness.  Abdominal:     General: Bowel sounds are normal. There is no distension.     Palpations: Abdomen is not rigid.     Tenderness: There is no abdominal tenderness. There is no guarding or rebound.     Hernia: No hernia is present.  Musculoskeletal:     Cervical back: Neck supple. No rigidity or tenderness.     Right lower leg: Edema present.     Left lower leg: Edema present.  Lymphadenopathy:     Cervical: No cervical adenopathy.  Skin:    General: Skin is warm.     Findings: No bruising, erythema, lesion or rash.  Neurological:     General: No focal deficit present.     Mental Status: She is alert and oriented to person, place, and time. Mental status is at baseline.     Cranial Nerves: No cranial nerve deficit.     Motor: No weakness or abnormal muscle tone.     Coordination: Coordination normal.     Gait: Gait normal.     Deep Tendon Reflexes: Reflexes normal.  Psychiatric:        Mood and Affect: Mood normal.        Behavior: Behavior normal.        Thought Content: Thought content normal.        Judgment: Judgment normal.          Assessment & Plan:  Diastolic dysfunction  Chronic diastolic CHF (congestive heart failure) (HCC)  Pulmonary hypertension (HCC) At this point, I am going to repeat echocardiogram given the 3/6 to 4/6 systolic ejection murmur heard best over the aortic valve to determine if aortic insufficiency [place] be contributing to her  dyspnea on exertion and lightheadedness and fatigue and weakness.  At this point I will continue her current dose of torsemide and spironolactone

## 2021-04-11 ENCOUNTER — Encounter (HOSPITAL_COMMUNITY): Payer: Self-pay | Admitting: General Practice

## 2021-04-11 ENCOUNTER — Ambulatory Visit (HOSPITAL_COMMUNITY)
Admission: EM | Admit: 2021-04-11 | Discharge: 2021-04-11 | Disposition: A | Discharge status: Home / Self Care | Payer: Medicare Other

## 2021-04-11 ENCOUNTER — Other Ambulatory Visit: Payer: Self-pay

## 2021-04-11 DIAGNOSIS — T148XXA Other injury of unspecified body region, initial encounter: Secondary | ICD-10-CM | POA: Diagnosis not present

## 2021-04-11 NOTE — ED Provider Notes (Addendum)
[facility]    CSN: [id number] Arrival date & time: 04/11/21  1719      History   Chief Complaint Chief Complaint  Patient presents with   Laceration    HPI [name] is a 86 y.o. female.   HPI  Laceration: Patient reports that earlier today a storm door accidentally grazed her right arm.  This caused her skin to slough off in the area.  She has had this happen many times before.  She has tried to contain the bleeding with gauze and an Ace wrap which has worked fairly well.  She is on a blood thinner.  She states that the site does burn but is not otherwise painful in terms of bone pain.  No fevers or abnormal discharge.  Past Medical History:  Diagnosis Date   Allergy    rhinitis   Aortic stenosis    mild by echo 06/2017   Arthritis    Bradycardia    a. 10/2017 -> beta blocker cut back due to HR 39.   Breast cancer ([place]) 01/06/2012   Cancer ([place])    right colon and left breast   Chronic diastolic CHF (congestive heart failure) ([place])    Colon cancer ([place]) 01/06/2012   Colovesical fistula    Dr. Marlou Starks and [name] planning surgery 08/2018- surgery revealed spontaneous closure   COPD (chronic obstructive pulmonary disease) ([place])    pt. denies   Coronary artery disease 2006   a.  NSTEMI in 2016, cath showed 15% prox-mid RCA, 20% prox LAD, EF 25-35% by cath and 35-40% -> felt due to Takotsubo cardiomyopathy.   Dilated aortic root ([place])    52mHg by echo 06/2017   Diverticulosis    Dyspnea    Edema extremities    GERD (gastroesophageal reflux disease)    Hernia    Hiatal hernia    denies   Hyperlipidemia    Hypertension    Mild aortic stenosis    echo 11/2015 but not noted on echo 06/2016   Osteopenia    Permanent atrial fibrillation (HCC)    chronic atrial fibrillation   Pneumonia    hx child   [place] ([facility]    a. moderate to severe PASP 668mg echo 11/2015 - now 4186m by echo 06/2017. CTA chest in 11/16 with no PE. PFTs in 7/15 with mild  obstructive lung disease. She had a negative sleep study in 2017. b. Felt due to left sided HF.   [facility]  Takotsubo syndrome 07/29/2015   a. EF 35-40% by echo; akinesis of mid-apical anteroseptal and apical myocardium.  EF now normalized on echo 11/2015    Patient Active Problem List   Diagnosis Date Noted   Postmenopausal bleeding 06/05/2020   Chest pain 07/23/2019   Diverticulitis large intestine w/o perforation or abscess w/o bleeding 09/04/2018   Colovesical fistula    Preoperative clearance 08/07/2018   TIA (transient ischemic attack) 01/06/2018   Breast cancer ([facility]/04/2018   Obesity, unspecified 09/13/2017   Warfarin anticoagulation 09/13/2017   Uterine mass 09/07/2017   Adnexal mass 08/03/2017   Pelvic abscess in female 07/07/2017   Aortic stenosis    Dyslipidemia, goal LDL below 70 11/28/2016   Monitoring for long-term anticoagulant use 12/31/2015   Chronic diastolic CHF (congestive heart failure) ([facility]/21/2017   Insomnia 10/23/2015   Leg swelling 08/11/2015   Takotsubo syndrome 07/29/2015   DOE (dyspnea on exertion) 08/15/2014   Dilated aortic root ([facility]  Pulmonary HTN ([place])    Permanent atrial fibrillation ([place]) 07/04/2013   Chronic anticoagulation 07/04/2013   Coronary artery disease    Lesion of nipple 05/15/2013   GERD (gastroesophageal reflux disease)    Hypertension    COPD (chronic obstructive pulmonary disease) ([place])    Osteopenia    Diverticulosis    Hiatal hernia    Abdominal pain 01/11/2012   Breast cancer of upper-outer quadrant of left female breast ([place]) 01/06/2012   Colon cancer ([place]) 01/06/2012    Past Surgical History:  Procedure Laterality Date   APPENDECTOMY     BREAST SURGERY     lumpectomy left   CARDIAC CATHETERIZATION     CARDIAC CATHETERIZATION N/A 07/28/2015   Procedure: Left Heart Cath and Coronary Angiography;  Surgeon: Peter M Martinique, MD;  Location: [facility];  Service: Cardiovascular;  Laterality: N/A;    CHOLECYSTECTOMY     COLECTOMY     right side   CYSTOSCOPY WITH STENT PLACEMENT Left 09/04/2018   Procedure: CYSTOSCOPY WITH LEFT STENT PLACEMENT, BLADDER REPAIR, CYSTOSCOPY WITH LEFT STENT REMOVAL;  Surgeon: Raynelle Bring, MD;  [facility];  Service: Urology;  Laterality: Left;   EXCISION OF ACCESSORY NIPPLE Bilateral 05/30/2013   Procedure: BILATERAL NIPPLE BIOPSY;  Surgeon: Merrie Roof, MD;  Location: [place];  Service: General;  Laterality: Bilateral;   EYE SURGERY Bilateral 12   cataracts   HYSTEROSCOPY WITH D & C N/A 05/29/2020   Procedure: DILATATION AND CURETTAGE /HYSTEROSCOPY;  Surgeon: Homero Fellers, MD;  Location[facility];  Service: Gynecology;  Laterality: N/A;   IR RADIOLOGIST EVAL & MGMT  07/26/2017   LAPAROSCOPIC RIGHT COLECTOMY N/A 09/04/2018   Procedure: LAPAROSCOPIC ASSISTED SIGMOID COLECTOMY WITH REPAIR OF FISTULA TO BLADDER;  Surgeon: Jovita Kussmaul, MD;  [facility];  Service: General;  Laterality: N/A;   RIGHT/LEFT HEART CATH AND CORONARY ANGIOGRAPHY N/A 12/10/2020   Procedure: RIGHT/LEFT HEART CATH AND CORONARY ANGIOGRAPHY;  Surgeon: Larey Dresser, MD;  Location: [facility];  Service: Cardiovascular;  Laterality: N/A;   SPLIT NIGHT STUDY  02/02/2016        OB History     Gravida  2   Para  1   Term  1   Preterm      AB  1   Living  1      SAB  1   IAB      Ectopic      Multiple      Live Births  1            Home Medications    Prior to Admission medications   Medication Sig Start Date End Date Taking? Authorizing Provider  acetaminophen (TYLENOL) 500 MG tablet Take 500-1,000 mg by mouth every 6 (six) hours as needed for headache (pain).   Yes [provider]  alendronate (FOSAMAX) 70 MG tablet Take 1 tablet (70 mg total) by mouth every 7 (seven) days. Take with a full glass of water on an empty stomach. 02/26/21  Yes [name], Cammie Mcgee, MD  amLODipine (NORVASC) 5 MG tablet Take 2 tablets (10 mg  total) by mouth daily. 02/17/21  Yes [place], Maricela Bo, FNP  apixaban (ELIQUIS) 5 MG TABS tablet Take 1 tablet (5 mg total) by mouth 2 (two) times daily. 12/11/20  Yes Larey Dresser, MD  benzonatate (TESSALON) 100 MG capsule Take 1 capsule (100 mg total) by mouth 2 (two) times daily as needed for  cough. Take first dose at night time and monitor for drowsiness.  If this medication makes you drowsy, do not take while driving or operating heavy machinery. 03/05/21  Yes Eulogio Bear, NP  clonazePAM (KLONOPIN) 0.5 MG tablet Take 1 tablet by mouth twice daily as needed for anxiety 01/22/21  Yes [name], MD  cloNIDine (CATAPRES) 0.1 MG tablet Take 1 tablet by mouth twice daily 04/18/20  Yes Turner, Eber Hong, MD  famotidine (PEPCID) 20 MG tablet Take 20 mg by mouth at bedtime.   Yes [provider]  losartan (COZAAR) 100 MG tablet Take 1 tablet by mouth once daily 09/11/20  Yes Turner, Eber Hong, MD  nitroGLYCERIN (NITROSTAT) 0.4 MG SL tablet DISSOLVE ONE TABLET UNDER THE TONGUE EVERY 5 MINUTES AS NEEDED FOR CHEST PAIN.  DO NOT EXCEED A TOTAL OF 3 DOSES IN 15 MINUTES 12/16/20  Yes [name], MD  spironolactone (ALDACTONE) 25 MG tablet Take 25 mg by mouth 2 (two) times daily.   Yes [provider]  torsemide (DEMADEX) 20 MG tablet Take 4 tablets (80 mg total) by mouth daily. Patient taking differently: Take 60 mg by mouth daily. 01/15/21  Yes [name] M, PA-C  Ascorbic Acid (VITAMIN C) 1000 MG tablet Take 1,000 mg by mouth once a week.    [provider]  Cholecalciferol (VITAMIN D3) 25 MCG (1000 UT) CAPS Take 1,000 Units by mouth once a week.    [provider]  clobetasol cream (TEMOVATE) [id number] % Apply 1 application topically as needed. 03/04/21   Eulogio Bear, NP  clotrimazole-betamethasone (LOTRISONE) cream Apply 1 application topically 2 (two) times daily. 10/12/19   Alycia Rossetti, MD  ferrous sulfate 325 (65 FE) MG tablet Take 1 tablet  (325 mg total) by mouth daily with breakfast. 07/04/20   [name], MD  fluticasone (FLONASE) 50 MCG/ACT nasal spray Place 2 sprays into both nostrils daily. 04/09/21   [name], MD    Family History Family History  Problem Relation Age of Onset   Heart disease Mother    Heart attack Mother    Cancer Sister        stomach and colon   Heart disease Brother 75   Hypertension Father    Cancer Sister    Stroke Neg Hx     Social History Social History   Tobacco Use   Smoking status: Never   Smokeless tobacco: Never  Vaping Use   Vaping Use: Never used  Substance Use Topics   Alcohol use: No   Drug use: No     Allergies   Contrast media [iodinated diagnostic agents], Clonidine derivatives, Statins, Sulfa antibiotics, Celebrex [celecoxib], Isosorbide nitrate, Other, and Tape   Review of Systems Review of Systems  As stated above in HPI Physical Exam Triage Vital Signs ED Triage Vitals  Enc Vitals Group     BP 04/11/21 1747 (!) 164/65     Pulse Rate 04/11/21 1747 67     Resp 04/11/21 1747 16     Temp 04/11/21 1747 98 F (36.7 C)     Temp Source 04/11/21 1747 Oral     SpO2 04/11/21 1747 96 %     Weight --      Height --      Head Circumference --      Peak Flow --      Pain Score 04/11/21 1748 6     Pain Loc --  Pain Edu? --      Excl. in [place]? --    No data found.  Updated Vital Signs BP (!) 164/65 (BP Location: Left Arm)   Pulse 67   Temp 98 F (36.7 C) (Oral)   Resp 16   SpO2 96%   Physical Exam Vitals and nursing note reviewed.  Constitutional:      General: She is not in acute distress.    Appearance: Normal appearance. She is not ill-appearing, toxic-appearing or diaphoretic.  Skin:    Comments: There is an irregular 2 inch x 2 inch superficial abrasion of the skin.  Patient's skin is extremely friable and that abrasion is irregular in nature.  Neurological:     Mental Status: She is alert.     UC Treatments / Results   Labs (all labs ordered are listed, but only abnormal results are displayed) Labs Reviewed - No data to display  EKG   Radiology No results found.  Procedures Procedures (including critical care time)  Medications Ordered in UC Medications - No data to display  Initial Impression / Assessment and Plan / UC Course  I have reviewed the triage vital signs and the nursing notes.  Pertinent labs & imaging results that were available during my care of the patient were reviewed by me and considered in my medical decision making (see chart for details).     New. She reports that her last Tdap was 1 year ago. I have recommended that we use Dermabond to help close some of the areas that are well approximated.  In the areas that are not wound well approximated wound nurse Otila Kluver who has used such practices on elderly patients in hospice care has recommended Tegaderm x7 days followed by getting in the shower using soap and water or an oil such as baby oil to help gently remove the Tegaderm.  Discussed risk for infections and bleeding.  Follow-up with PCP as needed. Final Clinical Impressions(s) / UC Diagnoses   Final diagnoses:  None   Discharge Instructions   None    ED Prescriptions   None    PDMP not reviewed this encounter.   Hughie Closs, Hershal Coria 04/11/21 1839    Hughie Closs, PA-C 04/11/21 [place], PA-C 04/11/21 [phone]

## 2021-04-11 NOTE — ED Triage Notes (Signed)
Happened today. Hit by the screen door, patient has laceration to the right arm.

## 2021-04-15 DIAGNOSIS — N183 Chronic kidney disease, stage 3 unspecified: Secondary | ICD-10-CM | POA: Diagnosis not present

## 2021-04-15 DIAGNOSIS — D631 Anemia in chronic kidney disease: Secondary | ICD-10-CM | POA: Diagnosis not present

## 2021-04-17 ENCOUNTER — Encounter: Payer: Self-pay | Admitting: Family Medicine

## 2021-04-17 ENCOUNTER — Other Ambulatory Visit: Payer: Self-pay

## 2021-04-17 ENCOUNTER — Ambulatory Visit (INDEPENDENT_AMBULATORY_CARE_PROVIDER_SITE_OTHER): Payer: Medicare Other | Admitting: Family Medicine

## 2021-04-17 VITALS — BP 148/86 | HR 60 | Temp 98.5°F | Resp 16 | Ht 66.0 in | Wt 176.0 lb

## 2021-04-17 DIAGNOSIS — L03119 Cellulitis of unspecified part of limb: Secondary | ICD-10-CM

## 2021-04-17 DIAGNOSIS — S51011A Laceration without foreign body of right elbow, initial encounter: Secondary | ICD-10-CM | POA: Diagnosis not present

## 2021-04-17 MED ORDER — DOXYCYCLINE HYCLATE 100 MG PO TABS: 100.0000 mg | ORAL_TABLET | Freq: Two times a day (BID) | ORAL | 0 refills | Status: DC

## 2021-04-17 NOTE — Progress Notes (Signed)
Subjective:    Patient ID: [name], female    DOB: 1935/01/23, 86 y.o.   MRN: [id number] Patient suffered a skin tear earlier this week.  She was walking out of a screen door when the door closed and tore the skin over her dorsal right forearm just distal to the elbow.  She has a circular skin tear roughly 3 cm in diameter.  There is no surrounding erythema or warmth or fluctuance.  There is no drainage or bleeding.  However the skin has been completely avulsed from this area.  There is a small triangular piece of skin that is bruised and purple that is very loose and I do not believe is viable.  She also has a bite on her left lower extremity in the mid shin.  There is a punctate bite area and there is spreading erythema in a triangular shape spreading down the leg.  The patch of erythema is roughly 6 cm to 7 cm in diameter.  It is warm and tender and suggest secondary cellulitis Past Medical History:  Diagnosis Date   Allergy    rhinitis   Aortic stenosis    mild by echo 06/2017   Arthritis    Bradycardia    a. 10/2017 -> beta blocker cut back due to HR 39.   Breast cancer ([place]) 01/06/2012   Cancer ([place])    right colon and left breast   Chronic diastolic CHF (congestive heart failure) ([place])    Colon cancer ([place]) 01/06/2012   Colovesical fistula    Dr. Marlou Starks and [name] planning surgery 08/2018- surgery revealed spontaneous closure   COPD (chronic obstructive pulmonary disease) ([place])    pt. denies   Coronary artery disease 2006   a.  NSTEMI in 2016, cath showed 15% prox-mid RCA, 20% prox LAD, EF 25-35% by cath and 35-40% -> felt due to Takotsubo cardiomyopathy.   Dilated aortic root ([place])    62mHg by echo 06/2017   Diverticulosis    Dyspnea    Edema extremities    GERD (gastroesophageal reflux disease)    Hernia    Hiatal hernia    denies   Hyperlipidemia    Hypertension    Mild aortic stenosis    echo 11/2015 but not noted on echo 06/2016   Osteopenia    Permanent atrial  fibrillation ([facility])    chronic atrial fibrillation   Pneumonia    hx child   Pulmonary HTN ([facility]    a. moderate to severe PASP 646mg echo 11/2015 - now 4144m by echo 06/2017. CTA chest in 11/16 with no PE. PFTs in 7/15 with mild obstructive lung disease. She had a negative sleep study in 2017. b. Felt due to left sided HF.   [facility]  Takotsubo syndrome 07/29/2015   a. EF 35-40% by echo; akinesis of mid-apical anteroseptal and apical myocardium.  EF now normalized on echo 11/2015   Past Surgical History:  Procedure Laterality Date   APPENDECTOMY     BREAST SURGERY     lumpectomy left   CARDIAC CATHETERIZATION     CARDIAC CATHETERIZATION N/A 07/28/2015   Procedure: Left Heart Cath and Coronary Angiography;  Surgeon: Peter M JorMartiniqueD;  Location: [facility];  Service: Cardiovascular;  Laterality: N/A;   CHOLECYSTECTOMY     COLECTOMY     right side   CYSTOSCOPY WITH STENT PLACEMENT Left 09/04/2018   Procedure: CYSTOSCOPY WITH LEFT STENT PLACEMENT, BLADDER REPAIR, CYSTOSCOPY WITH LEFT  STENT REMOVAL;  Surgeon: Raynelle Bring, MD;  Location: [facility];  Service: Urology;  Laterality: Left;   EXCISION OF ACCESSORY NIPPLE Bilateral 05/30/2013   Procedure: BILATERAL NIPPLE BIOPSY;  Surgeon: Merrie Roof, MD;  Location: [facility];  Service: General;  Laterality: Bilateral;   EYE SURGERY Bilateral 12   cataracts   HYSTEROSCOPY WITH D & C N/A 05/29/2020   Procedure: DILATATION AND CURETTAGE /HYSTEROSCOPY;  Surgeon: Homero Fellers, MD;  Location: [facility];  Service: Gynecology;  Laterality: N/A;   IR RADIOLOGIST EVAL & MGMT  07/26/2017   LAPAROSCOPIC RIGHT COLECTOMY N/A 09/04/2018   Procedure: LAPAROSCOPIC ASSISTED SIGMOID COLECTOMY WITH REPAIR OF FISTULA TO BLADDER;  Surgeon: Jovita Kussmaul, MD;  Location: [facility];  Service: General;  Laterality: N/A;   RIGHT/LEFT HEART CATH AND CORONARY ANGIOGRAPHY N/A 12/10/2020   Procedure: RIGHT/LEFT HEART CATH AND CORONARY ANGIOGRAPHY;  Surgeon:  Larey Dresser, MD;  Location: [place];  Service: Cardiovascular;  Laterality: N/A;   SPLIT NIGHT STUDY  02/02/2016       Current Outpatient Medications on File Prior to Visit  Medication Sig Dispense Refill   acetaminophen (TYLENOL) 500 MG tablet Take 500-1,000 mg by mouth every 6 (six) hours as needed for headache (pain).     alendronate (FOSAMAX) 70 MG tablet Take 1 tablet (70 mg total) by mouth every 7 (seven) days. Take with a full glass of water on an empty stomach. 4 tablet 11   amLODipine (NORVASC) 5 MG tablet Take 2 tablets (10 mg total) by mouth daily. 135 tablet 3   apixaban (ELIQUIS) 5 MG TABS tablet Take 1 tablet (5 mg total) by mouth 2 (two) times daily. 180 tablet 1   Ascorbic Acid (VITAMIN C) 1000 MG tablet Take 1,000 mg by mouth once a week.     benzonatate (TESSALON) 100 MG capsule Take 1 capsule (100 mg total) by mouth 2 (two) times daily as needed for cough. Take first dose at night time and monitor for drowsiness.  If this medication makes you drowsy, do not take while driving or operating heavy machinery. 20 capsule 0   Cholecalciferol (VITAMIN D3) 25 MCG (1000 UT) CAPS Take 1,000 Units by mouth once a week.     clobetasol cream (TEMOVATE) [id number] % Apply 1 application topically as needed. 30 g 0   clonazePAM (KLONOPIN) 0.5 MG tablet Take 1 tablet by mouth twice daily as needed for anxiety 60 tablet 3   cloNIDine (CATAPRES) 0.1 MG tablet Take 1 tablet by mouth twice daily 180 tablet 3   clotrimazole-betamethasone (LOTRISONE) cream Apply 1 application topically 2 (two) times daily. 30 g 0   famotidine (PEPCID) 20 MG tablet Take 20 mg by mouth at bedtime.     ferrous sulfate 325 (65 FE) MG tablet Take 1 tablet (325 mg total) by mouth daily with breakfast. 90 tablet 3   fluticasone (FLONASE) 50 MCG/ACT nasal spray Place 2 sprays into both nostrils daily. 16 g 6   losartan (COZAAR) 100 MG tablet Take 1 tablet by mouth once daily 90 tablet 2   nitroGLYCERIN (NITROSTAT)  0.4 MG SL tablet DISSOLVE ONE TABLET UNDER THE TONGUE EVERY 5 MINUTES AS NEEDED FOR CHEST PAIN.  DO NOT EXCEED A TOTAL OF 3 DOSES IN 15 MINUTES 25 tablet 0   spironolactone (ALDACTONE) 25 MG tablet Take 25 mg by mouth 2 (two) times daily.     torsemide (DEMADEX) 20 MG tablet Take 4 tablets (80 mg total)  by mouth daily. (Patient taking differently: Take 60 mg by mouth daily.) 270 tablet 3   No current facility-administered medications on file prior to visit.   Allergies  Allergen Reactions   Contrast Media [Iodinated Diagnostic Agents] Hives and Other (See Comments)    Per pt strong burning sensation starting in chest radiating outward    Clonidine Derivatives Other (See Comments)    Throat dry   Statins Other (See Comments)    "bones hurt"   Sulfa Antibiotics Diarrhea    Tremors    Celebrex [Celecoxib] Rash   Isosorbide Nitrate Itching and Rash   Other Itching and Rash    Plastic and paper tape and heart monitor pads   Tape Itching and Rash    Red Where applied and will spread   Social History   Socioeconomic History   Marital status: Married    Spouse name: Not on file   Number of children: Not on file   Years of education: Not on file   Highest education level: Not on file  Occupational History   Not on file  Tobacco Use   Smoking status: Never   Smokeless tobacco: Never  Vaping Use   Vaping Use: Never used  Substance and Sexual Activity   Alcohol use: No   Drug use: No   Sexual activity: Not Currently  Other Topics Concern   Not on file  Social History Narrative   Not on file   Social Determinants of Health   Financial Resource Strain: Not on file  Food Insecurity: Not on file  Transportation Needs: Not on file  Physical Activity: Not on file  Stress: Not on file  Social Connections: Not on file  Intimate Partner Violence: Not on file      Review of Systems  Musculoskeletal:  Positive for back pain.  All other systems reviewed and are negative.      Objective:   Physical Exam Vitals reviewed.  Constitutional:      General: She is not in acute distress.    Appearance: Normal appearance. She is well-developed and normal weight. She is not diaphoretic.  HENT:     Head: Normocephalic and atraumatic.     Right Ear: Tympanic membrane and ear canal normal.     Left Ear: Tympanic membrane and ear canal normal.     Nose: Nose normal. No congestion or rhinorrhea.     Mouth/Throat:     Mouth: Mucous membranes are moist.     Pharynx: No oropharyngeal exudate or posterior oropharyngeal erythema.  Eyes:     General:        Right eye: No discharge.        Left eye: No discharge.     Extraocular Movements: Extraocular movements intact.     Conjunctiva/sclera: Conjunctivae normal.     Pupils: Pupils are equal, round, and reactive to light.  Neck:     Thyroid: No thyromegaly.     Vascular: No carotid bruit or JVD.  Cardiovascular:     Rate and Rhythm: Normal rate and regular rhythm.     Heart sounds: Murmur heard.    No friction rub. No gallop.  Pulmonary:     Effort: Pulmonary effort is normal. No respiratory distress.     Breath sounds: Normal breath sounds. No stridor. No wheezing, rhonchi or rales.  Chest:     Chest wall: No tenderness.  Abdominal:     General: Bowel sounds are normal. There is no distension.  Palpations: Abdomen is not rigid.     Tenderness: There is no abdominal tenderness. There is no guarding or rebound.     Hernia: No hernia is present.  Musculoskeletal:       Arms:     Cervical back: Neck supple. No rigidity or tenderness.     Right lower leg: Edema present.     Left lower leg: Tenderness present. Edema present.       Legs:  Lymphadenopathy:     Cervical: No cervical adenopathy.  Skin:    General: Skin is warm.     Findings: No bruising, erythema, lesion or rash.  Neurological:     General: No focal deficit present.     Mental Status: She is alert and oriented to person, place, and time. Mental  status is at baseline.     Cranial Nerves: No cranial nerve deficit.     Motor: No weakness or abnormal muscle tone.     Coordination: Coordination normal.     Gait: Gait normal.     Deep Tendon Reflexes: Reflexes normal.  Psychiatric:        Mood and Affect: Mood normal.        Behavior: Behavior normal.        Thought Content: Thought content normal.        Judgment: Judgment normal.          Skin tear of right elbow without complication, initial encounter  Cellulitis of lower leg I will treat the cellulitis with doxycycline 100 mg p.o. twice daily for 7 days.  I recommended applying a Tegaderm to the skin tear every 3 days, then wrapping the area loosely with gauze and then [place] to avoid the Tegaderm peeling up with motion.  Anticipate 10 to 14 days to heal.  Recommended dressing changes every 3 days

## 2021-04-21 ENCOUNTER — Ambulatory Visit: Payer: Medicare Other | Admitting: Family Medicine

## 2021-04-22 DIAGNOSIS — D631 Anemia in chronic kidney disease: Secondary | ICD-10-CM | POA: Diagnosis not present

## 2021-04-22 DIAGNOSIS — D500 Iron deficiency anemia secondary to blood loss (chronic): Secondary | ICD-10-CM | POA: Diagnosis not present

## 2021-04-22 DIAGNOSIS — R195 Other fecal abnormalities: Secondary | ICD-10-CM | POA: Diagnosis not present

## 2021-04-22 DIAGNOSIS — N183 Chronic kidney disease, stage 3 unspecified: Secondary | ICD-10-CM | POA: Diagnosis not present

## 2021-04-28 ENCOUNTER — Ambulatory Visit (HOSPITAL_COMMUNITY): Payer: Medicare Other | Attending: Cardiology

## 2021-04-28 ENCOUNTER — Other Ambulatory Visit: Payer: Self-pay

## 2021-04-28 DIAGNOSIS — I272 Pulmonary hypertension, unspecified: Secondary | ICD-10-CM

## 2021-04-28 DIAGNOSIS — I5032 Chronic diastolic (congestive) heart failure: Secondary | ICD-10-CM | POA: Diagnosis not present

## 2021-04-28 LAB — ECHOCARDIOGRAM COMPLETE
AR max vel: 1.21 cm2
AV Area VTI: 1.15 cm2
AV Area mean vel: 1.15 cm2
AV Mean grad: 16.2 mmHg
AV Peak grad: 29.3 mmHg
Ao pk vel: 2.71 m/s
Area-P 1/2: 3.55 cm2
S' Lateral: 3 cm

## 2021-05-07 ENCOUNTER — Other Ambulatory Visit: Payer: Self-pay | Admitting: Cardiology

## 2021-05-19 ENCOUNTER — Telehealth (HOSPITAL_COMMUNITY): Payer: Self-pay | Admitting: *Deleted

## 2021-05-19 NOTE — Telephone Encounter (Signed)
Close encounter 

## 2021-05-20 ENCOUNTER — Other Ambulatory Visit: Payer: Self-pay

## 2021-05-20 ENCOUNTER — Ambulatory Visit (HOSPITAL_COMMUNITY)
Admission: RE | Admit: 2021-05-20 | Discharge: 2021-05-20 | Disposition: A | Discharge status: Home / Self Care | Payer: Medicare Other | Source: Ambulatory Visit | Attending: Family Medicine | Admitting: Family Medicine

## 2021-05-20 DIAGNOSIS — I5032 Chronic diastolic (congestive) heart failure: Secondary | ICD-10-CM | POA: Insufficient documentation

## 2021-05-20 MED ORDER — TECHNETIUM TC 99M PYROPHOSPHATE
21.0000 | Freq: Once | INTRAVENOUS | Status: AC
  Administered 2021-05-20: 21 via INTRAVENOUS

## 2021-05-27 ENCOUNTER — Ambulatory Visit (HOSPITAL_COMMUNITY)
Admission: RE | Admit: 2021-05-27 | Discharge: 2021-05-27 | Disposition: A | Discharge status: Home / Self Care | Payer: Medicare Other | Source: Ambulatory Visit | Attending: Cardiology | Admitting: Cardiology

## 2021-05-27 ENCOUNTER — Encounter (HOSPITAL_COMMUNITY): Payer: Self-pay | Admitting: Cardiology

## 2021-05-27 ENCOUNTER — Other Ambulatory Visit: Payer: Self-pay

## 2021-05-27 VITALS — BP 130/60 | HR 56 | Wt 181.4 lb

## 2021-05-27 DIAGNOSIS — Z79899 Other long term (current) drug therapy: Secondary | ICD-10-CM | POA: Insufficient documentation

## 2021-05-27 DIAGNOSIS — Z7901 Long term (current) use of anticoagulants: Secondary | ICD-10-CM | POA: Diagnosis not present

## 2021-05-27 DIAGNOSIS — I5032 Chronic diastolic (congestive) heart failure: Secondary | ICD-10-CM | POA: Diagnosis not present

## 2021-05-27 DIAGNOSIS — I482 Chronic atrial fibrillation, unspecified: Secondary | ICD-10-CM | POA: Diagnosis not present

## 2021-05-27 DIAGNOSIS — I350 Nonrheumatic aortic (valve) stenosis: Secondary | ICD-10-CM | POA: Insufficient documentation

## 2021-05-27 DIAGNOSIS — I2510 Atherosclerotic heart disease of native coronary artery without angina pectoris: Secondary | ICD-10-CM | POA: Insufficient documentation

## 2021-05-27 DIAGNOSIS — Z8249 Family history of ischemic heart disease and other diseases of the circulatory system: Secondary | ICD-10-CM | POA: Insufficient documentation

## 2021-05-27 DIAGNOSIS — I110 Hypertensive heart disease with heart failure: Secondary | ICD-10-CM | POA: Diagnosis not present

## 2021-05-27 LAB — CBC
HCT: 29.1 % — ABNORMAL LOW (ref 36.0–46.0)
Hemoglobin: 9.3 g/dL — ABNORMAL LOW (ref 12.0–15.0)
MCH: 29.2 pg (ref 26.0–34.0)
MCHC: 32 g/dL (ref 30.0–36.0)
MCV: 91.2 fL (ref 80.0–100.0)
Platelets: 261 10*3/uL (ref 150–400)
RBC: 3.19 MIL/uL — ABNORMAL LOW (ref 3.87–5.11)
RDW: 16.6 % — ABNORMAL HIGH (ref 11.5–15.5)
WBC: 5.7 10*3/uL (ref 4.0–10.5)
nRBC: 0 % (ref 0.0–0.2)

## 2021-05-27 LAB — BASIC METABOLIC PANEL
Anion gap: 9 (ref 5–15)
BUN: 44 mg/dL — ABNORMAL HIGH (ref 8–23)
CO2: 26 mmol/L (ref 22–32)
Calcium: 9.1 mg/dL (ref 8.9–10.3)
Chloride: 98 mmol/L (ref 98–111)
Creatinine, Ser: 1.27 mg/dL — ABNORMAL HIGH (ref 0.44–1.00)
GFR, Estimated: 42 mL/min — ABNORMAL LOW (ref >60)
Glucose, Bld: 101 mg/dL — ABNORMAL HIGH (ref 70–99)
Potassium: 4.4 mmol/L (ref 3.5–5.1)
Sodium: 133 mmol/L — ABNORMAL LOW (ref 135–145)

## 2021-05-27 LAB — BRAIN NATRIURETIC PEPTIDE: B Natriuretic Peptide: 115.6 pg/mL — ABNORMAL HIGH (ref 0.0–100.0)

## 2021-05-27 MED ORDER — TORSEMIDE 20 MG PO TABS: 80.0000 mg | ORAL_TABLET | Freq: Every day | ORAL | 11 refills | Status: DC

## 2021-05-27 NOTE — Patient Instructions (Addendum)
EKG done today.   RedsClip done today.   Labs done today. We will contact you only if your labs are abnormal.  INCREASE Torsemide to 80mg  (4 tableets) by mouth daily.   No medication changes were made. Please continue all current medications as prescribed.  Your physician recommends that you schedule a follow-up appointment in: 10 days for a lab only  appointment and in 3 months with our APP Clinic here in our office.  If you have any questions or concerns before your next appointment please send [place] a message through [place] or call our office at [phone].    TO LEAVE A MESSAGE FOR THE NURSE SELECT OPTION 2, PLEASE LEAVE A MESSAGE INCLUDING: YOUR NAME DATE OF BIRTH CALL BACK NUMBER REASON FOR CALL**this is important as we prioritize the call backs  YOU WILL RECEIVE A CALL BACK THE SAME DAY AS LONG AS YOU CALL BEFORE 4:00 PM   Do the following things EVERYDAY: Weigh yourself in the morning before breakfast. Write it down and keep it in a log. Take your medicines as prescribed Eat low salt foods--Limit salt (sodium) to 2000 mg per day.  Stay as active as you can everyday Limit all fluids for the day to less than 2 liters   At the [facility], you and your health needs are our priority. As part of our continuing mission to provide you with exceptional heart care, we have created designated Provider Care Teams. These Care Teams include your primary Cardiologist (physician) and Advanced Practice Providers (APPs- Physician Assistants and Nurse Practitioners) who all work together to provide you with the care you need, when you need it.   You Mas[place]o see any of the following providers on your designated Care Team at your next follow up: Dr Glori Bickers Dr Haynes Kerns, NP [name], [place] Audry Riles, PharmD   Please be sure to bring in all your medications bottles to every appointment.

## 2021-05-27 NOTE — Progress Notes (Signed)
ReDS Vest / Clip - 05/27/21 1400       ReDS Vest / Clip   Station Marker C    Ruler Value 27    ReDS Value Range Moderate volume overload    ReDS Actual Value 39

## 2021-05-28 NOTE — Progress Notes (Signed)
Patient ID: [name], female   DOB: September 01, 1935, 86 y.o.   MRN: [id number]    Advanced Heart Failure Clinic Note   PCP: [name], MD Cardiology: [name] HF Cardiology: Aundra Dubin  86 y.o. with history of poorly controlled HTN, chronic atrial fibrillation, chronic diastolic CHF, and prior episode of Takotsubo cardiomyopathy was referred by to HF clinic by Dr. [name]. Patient has had exertional dyspnea since 11/16.  At that time, she presented to the hospital with chest pain.  Coronary angiography showed no significant CAD.  EF was 35-40% by echo, suspected Takotsubo cardiomyopathy.  Her echo in 3/17 showed EF improved to 38-25% but PA systolic pressure elevated at 65 mmHg.  We transitioned her to torsemide.   RHC/LHC in 4/22 showed nonobstructive CAD; mean RA 5, PA 44/10, mean PCWP 20, CI 4.89, PVR < 1 WU.  Echo in 8/22 showed EF up to 65-70%, mild LVH, normal RV, PASP 67, moderate RAE, trivial MR. PYP scan in 9/22 was probably negative.   Patient returns for followup of CHF.  She still has significant dyspnea.  Got short of breath walking from car to office.  Can go about 100-200 feet before being winded.  Occasional atypical chest pain.  Taking care of husband with dementia.  No orthopnea/PND.    REDS clip 39%  Labs (11/16): LDL 80 Labs (4/17): K 4.5, creatinine 1.1, BNP 132 Labs (5/17): LDL 92 Labs (3/18): K 4, creatinine 0.98, LDL 81, proBNP 834 Labs (6/18): K 4.1, creatinine 1.37, hgb 12.5 Labs (10/21): LDL 79, HDL 71 Labs (5/22): myeloma panel and urine immunofixation negative Labs (7/22): K 4.4, creatinine 1.19  PMH: 1. Hyperlipidemia: Myalgias with atorvastatin, Crestor, simvastatin. Myalgias with Zetia.  2. Chronic atrial fibrillation 3. HTN: Poor control.  4. H/o breast cancer: 2013.  5. GERD 6. Takotsubo cardiomyopathy: Admitted in 11/16 with chest pain.  Coronary angiography showed nonobstructive CAD.  Echo (11/16) showed EF 35-40%.  EF back to normal by 3/17 echo.  7.  Chronic diastolic CHF - Echo (0/53) with EF 60-65%, mild aortic stenosis, PA systolic pressure 65 mmHg, RV mildly dilated with normal systolic function.  - Cardiolite (4/17) with EF 61%, small reversible mid anteroseptal/apical lateral defects thought to be related to shifting breast artifact => low risk study.  - Echo (12/21): EF 65-70%, normal RV, PASP 60 mmHg, mild AS, mild-moderate TR.   - RHC/LHC in 4/22 showed nonobstructive CAD; mean RA 5, PA 44/10, mean PCWP 20, CI 4.89, PVR < 1 WU.   - Echo in 8/22 showed EF up to 65-70%, mild LVH, normal RV, PASP 67, moderate RAE, trivial MR.  - PYP scan (9/22): H/CL < 1.5, grade 1.  Probably negative.   8. Ascending aortic aneurysm: 4.3 cm by CTA in 11/16.  9. Pulmonary hypertension: PASP 65 mmHg by echo in 3/17.  CTA chest in 11/16 with no PE.  PFTs in 7/15 with mild obstructive lung disease.  10. Sleep study negative 2017. 11. Bradycardia: off nodal blockers.  - Holter (8/20): Average HR 51, afib 12. Aortic stenosis: Mild.   Social History   Socioeconomic History   Marital status: Married    Spouse name: Not on file   Number of children: Not on file   Years of education: Not on file   Highest education level: Not on file  Occupational History   Not on file  Tobacco Use   Smoking status: Never   Smokeless tobacco: Never  Vaping Use   Vaping  Use: Never used  Substance and Sexual Activity   Alcohol use: No   Drug use: No   Sexual activity: Not Currently  Other Topics Concern   Not on file  Social History Narrative   Not on file   Social Determinants of Health   Financial Resource Strain: Not on file  Food Insecurity: Not on file  Transportation Needs: Not on file  Physical Activity: Not on file  Stress: Not on file  Social Connections: Not on file  Intimate Partner Violence: Not on file   Family History  Problem Relation Age of Onset   Heart disease Mother    Heart attack Mother    Cancer Sister        stomach and colon    Heart disease Brother 49   Hypertension Father    Cancer Sister    Stroke Neg Hx    ROS: All systems reviewed and negative except as per HPI.   Current Outpatient Medications  Medication Sig Dispense Refill   acetaminophen (TYLENOL) 500 MG tablet Take 500-1,000 mg by mouth every 6 (six) hours as needed for headache (pain).     amLODipine (NORVASC) 5 MG tablet Take 2 tablets (10 mg total) by mouth daily. 135 tablet 3   apixaban (ELIQUIS) 5 MG TABS tablet Take 1 tablet (5 mg total) by mouth 2 (two) times daily. 180 tablet 1   Ascorbic Acid (VITAMIN C) 1000 MG tablet Take 1,000 mg by mouth once a week.     Cholecalciferol (VITAMIN D3) 25 MCG (1000 UT) CAPS Take 1,000 Units by mouth once a week.     clobetasol cream (TEMOVATE) 0.17 % Apply 1 application topically as needed. 30 g 0   clonazePAM (KLONOPIN) 0.5 MG tablet Take 1 tablet by mouth twice daily as needed for anxiety 60 tablet 3   cloNIDine (CATAPRES) 0.1 MG tablet Take 1 tablet by mouth twice daily 180 tablet 1   clotrimazole-betamethasone (LOTRISONE) cream Apply 1 application topically as needed.     famotidine (PEPCID) 20 MG tablet Take 20 mg by mouth at bedtime.     ferrous sulfate 325 (65 FE) MG tablet Take 1 tablet (325 mg total) by mouth daily with breakfast. 90 tablet 3   fluticasone (FLONASE) 50 MCG/ACT nasal spray Place 1 spray into both nostrils as needed for allergies or rhinitis.     losartan (COZAAR) 100 MG tablet Take 1 tablet by mouth once daily 90 tablet 2   nitroGLYCERIN (NITROSTAT) 0.4 MG SL tablet DISSOLVE ONE TABLET UNDER THE TONGUE EVERY 5 MINUTES AS NEEDED FOR CHEST PAIN.  DO NOT EXCEED A TOTAL OF 3 DOSES IN 15 MINUTES 25 tablet 0   spironolactone (ALDACTONE) 25 MG tablet Take 25 mg by mouth daily.     torsemide (DEMADEX) 20 MG tablet Take 4 tablets (80 mg total) by mouth daily. 120 tablet 11   No current facility-administered medications for this encounter.   BP 130/60   Pulse (!) 56   Wt 82.3 kg (181 lb  6.4 oz)   SpO2 96%   BMI 29.28 kg/m    Wt Readings from Last 3 Encounters:  05/27/21 82.3 kg (181 lb 6.4 oz)  05/20/21 79.8 kg (176 lb)  04/17/21 79.8 kg (176 lb)    General: NAD Neck: JVP 8 cm, no thyromegaly or thyroid nodule.  Lungs: Clear to auscultation bilaterally with normal respiratory effort. CV: Nondisplaced PMI.  Heart irregular S1/S2, no S3/S4, 2/6 SEM RUSB.  1+ ankle edema.  No carotid bruit.  Normal pedal pulses.  Abdomen: Soft, nontender, no hepatosplenomegaly, no distention.  Skin: Intact without lesions or rashes.  Neurologic: Alert and oriented x 3.  Psych: Normal affect. Extremities: No clubbing or cyanosis.  HEENT: Normal.   Assessment/Plan: 1. Chronic atrial fibrillation: She is off nodal blockade with HR in 50s.  - Continue Eliquis 5 mg bid.   CBC today.  2. HTN: BP controlled.  Continue current regimen.  3. Chronic diastolic CHF: NYHA class III symptoms, slowly progressive over time.  Mild volume overload on exam.  RHC/LHC in 4/22 showed nonobstructive CAD; mean RA 5, PA 44/10, mean PCWP 20, CI 4.89, PVR < 1 WU (pulmonary venous hypertension).  Echo in 8/22 showed EF up to 65-70%, mild LVH, normal RV, PASP 67, moderate RAE, trivial MR. PYP scan in 9/22 was probably negative. She is mildly volume overloaded by exam and REDS clip.  - Increase torsemide to 80 mg daily. BMET/BNP today, BMET 10 days.  - Unable to tolerate Jardiance due to frequent UTIs.  4. CAD: Nonobstructive on 4/22 cath. She has been intolerant of statins and Zetia.    - Will discuss referral to lipid clinic for [place].  - No ASA given apixaban use.   Followup in 3 months with APP.   Loralie Champagne 05/28/2021

## 2021-05-29 ENCOUNTER — Other Ambulatory Visit (HOSPITAL_COMMUNITY): Payer: Self-pay | Admitting: *Deleted

## 2021-05-29 DIAGNOSIS — I5032 Chronic diastolic (congestive) heart failure: Secondary | ICD-10-CM

## 2021-06-05 ENCOUNTER — Telehealth: Payer: Self-pay | Admitting: Family Medicine

## 2021-06-05 NOTE — Chronic Care Management (AMB) (Signed)
  Chronic Care Management   Note  06/05/2021 Name: Josselyn Harkins Callaway MRN: [id number] DOB: [birth year]  [name] Yochim is a 86 y.o. year old female who is a primary care patient of [name], MD. I reached out to [place] [facility] by phone today in response to a referral sent by Ms. [name] Gar[place]ano's PCP, [name], MD.   Ms. Dwiggins was given information about Chronic Care Management services today including:  CCM service includes personalized support from designated clinical staff supervised by her physician, including individualized plan of care and coordination with other care providers 24/7 contact phone numbers for assistance for urgent and routine care needs. Service will only be billed when office clinical staff spend 20 minutes or more in a month to coordinate care. Only one practitioner [place] furnish and bill the service in a calendar month. The patient Villella stop CCM services at any time (effective at the end of the month) by phone call to the office staff.   Patient agreed to services and verbal consent obtained.   Follow up plan:   [place] Secretary/administrator

## 2021-06-08 ENCOUNTER — Other Ambulatory Visit: Payer: Self-pay

## 2021-06-08 ENCOUNTER — Other Ambulatory Visit: Payer: Medicare Other | Admitting: *Deleted

## 2021-06-08 ENCOUNTER — Other Ambulatory Visit (HOSPITAL_COMMUNITY): Payer: Medicare Other

## 2021-06-08 DIAGNOSIS — I5032 Chronic diastolic (congestive) heart failure: Secondary | ICD-10-CM | POA: Diagnosis not present

## 2021-06-09 LAB — BASIC METABOLIC PANEL
BUN/Creatinine Ratio: 33 — ABNORMAL HIGH (ref 12–28)
BUN: 42 mg/dL — ABNORMAL HIGH (ref 8–27)
CO2: 24 mmol/L (ref 20–29)
Calcium: 9.5 mg/dL (ref 8.7–10.3)
Chloride: 97 mmol/L (ref 96–106)
Creatinine, Ser: 1.29 mg/dL — ABNORMAL HIGH (ref 0.57–1.00)
Glucose: 131 mg/dL — ABNORMAL HIGH (ref 70–99)
Potassium: 4.4 mmol/L (ref 3.5–5.2)
Sodium: 137 mmol/L (ref 134–144)
eGFR: 41 mL/min/{1.73_m2} — ABNORMAL LOW (ref >59)

## 2021-06-10 DIAGNOSIS — H9201 Otalgia, right ear: Secondary | ICD-10-CM | POA: Diagnosis not present

## 2021-06-10 DIAGNOSIS — H6980 Other specified disorders of Eustachian tube, unspecified ear: Secondary | ICD-10-CM | POA: Diagnosis not present

## 2021-06-13 DIAGNOSIS — R6884 Jaw pain: Secondary | ICD-10-CM | POA: Diagnosis not present

## 2021-06-13 DIAGNOSIS — J029 Acute pharyngitis, unspecified: Secondary | ICD-10-CM | POA: Diagnosis not present

## 2021-06-15 ENCOUNTER — Telehealth: Payer: Self-pay | Admitting: Pharmacist

## 2021-06-15 NOTE — Progress Notes (Signed)
Chronic Care Management Pharmacy Assistant   Name: [name]  MRN: [id number] DOB: Jul 18, 1935  [name] is an 86 y.o. year old female who presents for his initial CCM visit with the clinical pharmacist.  Reason for Encounter: Chart Prep   Conditions to be addressed/monitored: HTN, CAD, Afib, CHF, COPD, GERD.  Primary concerns for visit include: HTN  Recent office visits: 04/17/21 Dr. Dennard Schaumann for insect bite. STARTED Doxycycline Hyclate 100 mg 2 times daily.  04/09/21 Dr. Dennard Schaumann For follow-up. STARTED Fluticasone Propionate 50 MCG/ACT 2 sprays each nare daily.  03/26/21 Dr. Dennard Schaumann For Shortness of Breath. Per note: As a result I will increase the dose of her aldosterone antagonist.  Increase spironolactone to 25 mg twice a day and then recheck in 2 weeks to monitor renal function and potassium.  Also discussed possibly adding Jardiance but the patient declines due to the risk of urinary tract infections.  She has preserved ejection fraction and therefore she would not qualify for Entresto 03/05/21 (Telephone) Eulogio Bear, NP. STARTED Benzonatate 100 mg 2 times daily PRN.  03/04/21 Eulogio Bear, NP. For cough. STARTED Clobetasol Propionate 8.24% 1 application topical as needed.   Recent consult visits:  06/10/21 [facility] Earlie Counts Frederik Pear., MD  For jaw pain. No medication changes. 04/11/21 [facility] [place], Holli Humbles, [place]. For Skin Abrasion. No medication changes.   Hospital visits:  None in the last six months  Medication History: Losartan 100 mg 03/11/21 90 DS.  Medications: Outpatient Encounter Medications as of 06/15/2021  Medication Sig   acetaminophen (TYLENOL) 500 MG tablet Take 500-1,000 mg by mouth every 6 (six) hours as needed for headache (pain).   amLODipine (NORVASC) 5 MG tablet Take 2 tablets (10 mg total) by mouth daily.   apixaban (ELIQUIS) 5 MG TABS tablet Take 1 tablet (5 mg total) by mouth 2 (two)  times daily.   Ascorbic Acid (VITAMIN C) 1000 MG tablet Take 1,000 mg by mouth once a week.   Cholecalciferol (VITAMIN D3) 25 MCG (1000 UT) CAPS Take 1,000 Units by mouth once a week.   clobetasol cream (TEMOVATE) 2.35 % Apply 1 application topically as needed.   clonazePAM (KLONOPIN) 0.5 MG tablet Take 1 tablet by mouth twice daily as needed for anxiety   cloNIDine (CATAPRES) 0.1 MG tablet Take 1 tablet by mouth twice daily   clotrimazole-betamethasone (LOTRISONE) cream Apply 1 application topically as needed.   famotidine (PEPCID) 20 MG tablet Take 20 mg by mouth at bedtime.   ferrous sulfate 325 (65 FE) MG tablet Take 1 tablet (325 mg total) by mouth daily with breakfast.   fluticasone (FLONASE) 50 MCG/ACT nasal spray Place 1 spray into both nostrils as needed for allergies or rhinitis.   losartan (COZAAR) 100 MG tablet Take 1 tablet by mouth once daily   nitroGLYCERIN (NITROSTAT) 0.4 MG SL tablet DISSOLVE ONE TABLET UNDER THE TONGUE EVERY 5 MINUTES AS NEEDED FOR CHEST PAIN.  DO NOT EXCEED A TOTAL OF 3 DOSES IN 15 MINUTES   spironolactone (ALDACTONE) 25 MG tablet Take 25 mg by mouth daily.   torsemide (DEMADEX) 20 MG tablet Take 4 tablets (80 mg total) by mouth daily.   No facility-administered encounter medications on file as of 06/15/2021.   Have you seen any other providers since your last visit? Patient stated on 06/13/21 she went back to the [facility] for her jaw pain.s   Any changes in your  medications or health? Patient stated on 06/13/21 she was given tramadol, Predisone, and Antibiotics  Any side effects from any medications? Patient stated no.   Do you have an symptoms or problems not managed by your medications? Patient stated no.   Any concerns about your health right now? Patient stated her jaw pain.  Has your provider asked that you check blood pressure, blood sugar, or follow special diet at home? Patient stated she checks her blood pressure at home sometimes.   Do  you get any type of exercise on a regular basis? Patient stated no.   Can you think of a goal you would like to reach for your health? Patient she would like to stop getting so short winded and have more energy.    Do you have any problems getting your medications? Patient stated her Eldridge Abrahams is getting to expensive.   Is there anything that you would like to discuss during the appointment? Patient stated no.   Please bring medications and supplements to appointment, patient reminded of her OTP appointment on 06/18/21 at 930 am.    [place], [place] Pharmacist Assistant [phone]

## 2021-06-16 NOTE — Progress Notes (Signed)
Chronic Care Management Pharmacy Note  06/18/2021 Name:  [name] MRN:  [id number] DOB:  1935/05/08  Summary: Initial PharmD visit.  Main concern today is her ongoing jaw pain.  She has appt with specialist to assess but cannot get in until November.  BP was elevated today when she checked at home. Asked her to monitor and record.  Recommendations/Changes made from today's visit: No changes at this tim - monitor Eliquis dose with low Hgb  Plan: BP check in 30 days PharmD in 6 months   Subjective: [name] is an 86 y.o. year old female who is a primary patient of [name], Cammie Mcgee, MD.  The CCM team was consulted for assistance with disease management and care coordination needs.    Engaged with patient by telephone for initial visit in response to provider referral for pharmacy case management and/or care coordination services.   Consent to Services:  The patient was given the following information about Chronic Care Management services today, agreed to services, and gave verbal consent: 1. CCM service includes personalized support from designated clinical staff supervised by the primary care provider, including individualized plan of care and coordination with other care providers 2. 24/7 contact phone numbers for assistance for urgent and routine care needs. 3. Service will only be billed when office clinical staff spend 20 minutes or more in a month to coordinate care. 4. Only one practitioner [place] furnish and bill the service in a calendar month. 5.The patient [place] stop CCM services at any time (effective at the end of the month) by phone call to the office staff. 6. The patient will be responsible for cost sharing (co-pay) of up to 20% of the service fee (after annual deductible is met). Patient agreed to services and consent obtained.  Patient Care Team: [name], MD as PCP - General (Family Medicine) [name], MD as PCP - Cardiology (Cardiology) Edythe Clarity, [facility] as Pharmacist (Pharmacist)  Recent office visits: 04/17/21 Dr. Dennard Schaumann for insect bite. STARTED Doxycycline Hyclate 100 mg 2 times daily.  04/09/21 Dr. Dennard Schaumann For follow-up. STARTED Fluticasone Propionate 50 MCG/ACT 2 sprays each nare daily.  03/26/21 Dr. Dennard Schaumann For Shortness of Breath. Per note: As a result I will increase the dose of her aldosterone antagonist.  Increase spironolactone to 25 mg twice a day and then recheck in 2 weeks to monitor renal function and potassium.  Also discussed possibly adding Jardiance but the patient declines due to the risk of urinary tract infections.  She has preserved ejection fraction and therefore she would not qualify for Entresto 03/05/21 (Telephone) Eulogio Bear, NP. STARTED Benzonatate 100 mg 2 times daily PRN.  03/04/21 Eulogio Bear, NP. For cough. STARTED Clobetasol Propionate 9.56% 1 application topical as needed.    Recent consult visits:  06/10/21 [facility] Earlie Counts Frederik Pear., MD  For jaw pain. No medication changes. 04/11/21 [facility] [place] [facility] [place], Holli Humbles, [place]. For Skin Abrasion. No medication changes.    Hospital visits:  None in the last six months   Objective:  Lab Results  Component Value Date   CREATININE 1.29 (H) 06/08/2021   BUN 42 (H) 06/08/2021   GFR 90.44 01/27/2015   GFRNONAA 42 (L) 05/27/2021   GFRAA 60 07/03/2020   NA 137 06/08/2021   K 4.4 06/08/2021   CALCIUM 9.5 06/08/2021   CO2 24 06/08/2021   GLUCOSE 131 (H) 06/08/2021    Lab  Results  Component Value Date/Time   HGBA1C 5.9 (H) 03/14/2014 09:43 AM   GFR 90.44 01/27/2015 11:48 AM   GFR 79.60 02/15/2014 10:34 AM    Last diabetic Eye exam: No results found for: HMDIABEYEEXA  Last diabetic Foot exam: No results found for: HMDIABFOOTEX   Lab Results  Component Value Date   CHOL 162 07/03/2020   HDL 71 07/03/2020   LDLCALC 79 07/03/2020   TRIG 43 07/03/2020   CHOLHDL 2.3 07/03/2020    Hepatic  Function Latest Ref Rng & Units 07/03/2020 05/23/2020 04/21/2020  Total Protein 6.1 - 8.1 g/dL 7.2 6.7 -  Albumin 3.5 - 5.0 g/dL - - -  AST 10 - 35 U/L 17 17 -  ALT 6 - 29 U/L 10 12 13   Alk Phosphatase 38 - 126 U/L - - -  Total Bilirubin 0.2 - 1.2 mg/dL 0.5 0.5 -  Bilirubin, Direct 0.0 - 0.2 mg/dL - - -    Lab Results  Component Value Date/Time   TSH 2.20 07/08/2016 09:58 AM   TSH 1.046 02/26/2013 10:09 AM    CBC Latest Ref Rng & Units 05/27/2021 02/17/2021 12/10/2020  WBC 4.0 - 10.5 K/uL 5.7 5.1 -  Hemoglobin 12.0 - 15.0 g/dL 9.3(L) 10.1(L) 10.2(L)  Hematocrit 36.0 - 46.0 % 29.1(L) 30.4(L) 30.0(L)  Platelets 150 - 400 K/uL 261 233 -    Lab Results  Component Value Date/Time   VD25OH 33 02/09/2013 09:47 AM    Clinical ASCVD: Yes  The ASCVD Risk score (Arnett DK, et al., 2019) failed to calculate for the following reasons:   The 2019 ASCVD risk score is only valid for ages 11 to 42    Depression screen PHQ 2/9 09/30/2020 07/03/2020 10/12/2019  Decreased Interest 0 0 0  Down, Depressed, Hopeless 0 0 0  PHQ - 2 Score 0 0 0  Some recent data might be hidden     Social History   Tobacco Use  Smoking Status Never  Smokeless Tobacco Never   BP Readings from Last 3 Encounters:  05/27/21 130/60  04/17/21 (!) 148/86  04/11/21 (!) 164/65   Pulse Readings from Last 3 Encounters:  05/27/21 (!) 56  04/17/21 60  04/11/21 67   Wt Readings from Last 3 Encounters:  05/27/21 181 lb 6.4 oz (82.3 kg)  05/20/21 176 lb (79.8 kg)  04/17/21 176 lb (79.8 kg)   BMI Readings from Last 3 Encounters:  05/27/21 29.28 kg/m  05/20/21 28.41 kg/m  04/17/21 28.41 kg/m    Assessment/Interventions: Review of patient past medical history, allergies, medications, health status, including review of consultants reports, laboratory and other test data, was performed as part of comprehensive evaluation and provision of chronic care management services.   SDOH:  (Social Determinants of Health)  assessments and interventions performed: Yes  Financial Resource Strain: Not on file    SDOH Screenings   Alcohol Screen: Not on file  Depression (PHQ2-9): Low Risk    PHQ-2 Score: 0  Financial Resource Strain: Not on file  Food Insecurity: Not on file  Housing: Not on file  Physical Activity: Not on file  Social Connections: Not on file  Stress: Not on file  Tobacco Use: Low Risk    Smoking Tobacco Use: Never   Smokeless Tobacco Use: Never  Transportation Needs: Not on file    CCM Care Plan  Allergies  Allergen Reactions   Contrast Media [Iodinated Diagnostic Agents] Hives and Other (See Comments)    Per pt strong burning  sensation starting in chest radiating outward    Clonidine Derivatives Other (See Comments)    Throat dry   Statins Other (See Comments)    "bones hurt"   Sulfa Antibiotics Diarrhea    Tremors    Celebrex [Celecoxib] Rash   Isosorbide Nitrate Itching and Rash   Other Itching and Rash    Plastic and paper tape and heart monitor pads   Tape Itching and Rash    Red Where applied and will spread    Medications Reviewed Today     Reviewed by Edythe Clarity, [facility] (Pharmacist) on 06/18/21 at [place] List Status: <None>   Medication Order Taking? Sig Documenting Provider Last Dose Status Informant  acetaminophen (TYLENOL) 500 MG tablet [id number] Yes Take 500-1,000 mg by mouth every 6 (six) hours as needed for headache (pain). [provider] Taking Active Self  amLODipine (NORVASC) 5 MG tablet [id number] Yes Take 2 tablets (10 mg total) by mouth daily. Ferrum, Maricela Bo, FNP Taking Active   apixaban (ELIQUIS) 5 MG TABS tablet [id number] Yes Take 1 tablet (5 mg total) by mouth 2 (two) times daily. Larey Dresser, MD Taking Active   Ascorbic Acid (VITAMIN C) 1000 MG tablet [id number] Yes Take 1,000 mg by mouth once a week. [provider] Taking Active Self  Cholecalciferol (VITAMIN D3) 25 MCG (1000 UT) CAPS [id number] Yes Take 1,000  Units by mouth once a week. [provider] Taking Active Self  clobetasol cream (TEMOVATE) 0.05 % [id number]  Apply 1 application topically as needed. Eulogio Bear, NP  Active            Med Note Gilman Buttner, Juanita Laster   Wed May 27, 2021  1:57 PM)    clonazePAM (KLONOPIN) 0.5 MG tablet [id number] Yes Take 1 tablet by mouth twice daily as needed for anxiety [name], MD Taking Active   cloNIDine (CATAPRES) 0.1 MG tablet [id number] Yes Take 1 tablet by mouth twice daily Turner, Eber Hong, MD Taking Active   clotrimazole-betamethasone (LOTRISONE) cream [id number]  Apply 1 application topically as needed. [provider]  Active   famotidine (PEPCID) 20 MG tablet [id number] Yes Take 20 mg by mouth at bedtime. [provider] Taking Active Self  ferrous sulfate 325 (65 FE) MG tablet [id number] Yes Take 1 tablet (325 mg total) by mouth daily with breakfast. [name], MD Taking Active Self  fluticasone (FLONASE) 50 MCG/ACT nasal spray [id number] Yes Place 1 spray into both nostrils as needed for allergies or rhinitis. [provider] Taking Active   losartan (COZAAR) 100 MG tablet [id number] Yes Take 1 tablet by mouth once daily Turner, Eber Hong, MD Taking Active Self  nitroGLYCERIN (NITROSTAT) 0.4 MG SL tablet [id number] Yes DISSOLVE ONE TABLET UNDER THE TONGUE EVERY 5 MINUTES AS NEEDED FOR CHEST PAIN.  DO NOT EXCEED A TOTAL OF 3 DOSES IN 15 MINUTES [name], MD Taking Active            Med Note Clydene Laming May 27, 2021  1:59 PM)    spironolactone (ALDACTONE) 25 MG tablet [id number] Yes Take 25 mg by mouth daily. [provider] Taking Active   torsemide (DEMADEX) 20 MG tablet [id number] Yes Take 4 tablets (80 mg total) by mouth daily. Larey Dresser, MD Taking Active             Patient Active Problem List   Diagnosis Date Noted   Postmenopausal bleeding 06/05/2020  Chest pain 07/23/2019   Diverticulitis large  intestine w/o perforation or abscess w/o bleeding 09/04/2018   Colovesical fistula    Preoperative clearance 08/07/2018   TIA (transient ischemic attack) 01/06/2018   Breast cancer ([place]) 09/13/2017   Obesity, unspecified 09/13/2017   Warfarin anticoagulation 09/13/2017   Uterine mass 09/07/2017   Adnexal mass 08/03/2017   Pelvic abscess in female 07/07/2017   Aortic stenosis    Dyslipidemia, goal LDL below 70 11/28/2016   Monitoring for long-term anticoagulant use 12/31/2015   Chronic diastolic CHF (congestive heart failure) ([place]) 10/28/2015   Insomnia 10/23/2015   Leg swelling 08/11/2015   Takotsubo syndrome 07/29/2015   DOE (dyspnea on exertion) 08/15/2014   Dilated aortic root (HCC)    Pulmonary HTN (HCC)    Permanent atrial fibrillation ([place]) 07/04/2013   Chronic anticoagulation 07/04/2013   Coronary artery disease    Lesion of nipple 05/15/2013   GERD (gastroesophageal reflux disease)    Hypertension    COPD (chronic obstructive pulmonary disease) ([place])    Osteopenia    Diverticulosis    Hiatal hernia    Abdominal pain 01/11/2012   Breast cancer of upper-outer quadrant of left female breast ([place]) 01/06/2012   Colon cancer ([place]) 01/06/2012    Immunization History  Administered Date(s) Administered   Fluad Quad(high Dose 65+) 06/18/2019, 07/03/2020   Influenza Split 07/07/2010   Influenza, High Dose Seasonal PF 07/14/2018   Influenza,inj,Quad PF,6+ Mos 07/25/2013, 05/19/2015, 07/08/2016   Meningococcal Polysaccharide 07/08/2007   PFIZER(Purple Top)SARS-COV-2 Vaccination 10/01/2019, 10/22/2019, 07/13/2020   Pneumococcal Conjugate-13 03/14/2014   Pneumococcal Polysaccharide-23 07/15/2005   Td 04/06/2010   Tdap 05/15/2020   Zoster, Live 02/10/2015    Conditions to be addressed/monitored:  HTN, CAD, Afib, COPD, Osteopenia, HLD, Breast cancer  Care Plan : General Pharmacy (Adult)  Updates made by Edythe Clarity, RPH since 06/18/2021 12:00 AM     Problem:  HTN, HLD, Afib, Osteoporosis   Priority: High  Onset Date: 06/18/2021     Long-Range Goal: Patient-Specific Goal   Start Date: 06/18/2021  Expected End Date: 12/17/2021  This Visit's Progress: On track  Priority: High  Note:   Current Barriers:  Unable to independently afford treatment regimen Unable to achieve control of BP   Pharmacist Clinical Goal(s):  Patient will verbalize ability to afford treatment regimen achieve adherence to monitoring guidelines and medication adherence to achieve therapeutic efficacy achieve improvement in BP as evidenced by monitoring through collaboration with PharmD and provider.   Interventions: 1:1 collaboration with [name], MD regarding development and update of comprehensive plan of care as evidenced by provider attestation and co-signature Inter-disciplinary care team collaboration (see longitudinal plan of care) Comprehensive medication review performed; medication list updated in electronic medical record  Hypertension (BP goal <140/90) -Not ideally controlled -Current treatment: Amlodipine 45m daily Clonidine 0.154mBID Losartan 1001maily Spironolactone 62m99mily -Medications previously tried: metoprolol, valsartan  -Current home readings: 182/73 P 62, 165/62 P 61 -Current exercise habits: minimal due to having to take care of her husband -Denies hypotensive/hypertensive symptoms -Educated on BP goals and benefits of medications for prevention of heart attack, stroke and kidney damage; Daily salt intake goal < 2300 mg; Exercise goal of 150 minutes per week; Importance of home blood pressure monitoring; -Counseled to monitor BP at home weekly, document, and provide log at future appointments -Recommended to continue current medication Have asked patient to monitor and record, will f/u on BP readings next month  Hyperlipidemia/CAD: (LDL goal < 70) -  Controlled -Current treatment: None -Medications previously tried: none  noted  -LDL is normal at last lipid panel -Educated on Cholesterol goals;  Importance of limiting foods high in cholesterol; Exercise goal of 150 minutes per week; -Recommended to continue current medication  Atrial Fibrillation (Goal: prevent stroke and major bleeding) -Controlled -CHADSVASC: 8 -Current treatment: Rate control: none Anticoagulation: Eliquis 67m BID -Medications previously tried: metoprolol -Home BP and HR readings: HR is well controlled  -Counseled on increased risk of stroke due to Afib and benefits of anticoagulation for stroke prevention; bleeding risk associated with Eliquis and importance of self-monitoring for signs/symptoms of bleeding; seeking medical attention after a head injury or if there is blood in the urine/stool; -Hgb continues to be low, however based on her weight and Scr she is on correct dose of Eliquis.  Will continue to monitor for need to decrease dose. -Recommended to continue current medication Consider dose decrease should weight be < 60kg or Scr be > 1.5.   Osteoporosis / Osteopenia (Goal Prevent fractures/maintain bone density) -Not ideally controlled -Last DEXA Scan: 02/04/21   T-Score femoral neck: -3.0  T-Score lumbar spine: -3.0 -Patient is a candidate for pharmacologic treatment due to T-Score < -2.5 in lumbar spine -Current treatment  None -Medications previously tried: Fosamax (throat irritation), Prolia (unclear if she started due to $$)  -Recommend [phone] units of vitamin D daily. Recommend 1200 mg of calcium daily from dietary and supplemental sources. Recommend weight-bearing and muscle strengthening exercises for building and maintaining bone density. -Patient is currently having some jaw pain.  She took one dose of Fosamax and her jaw pain started about 4 weeks later.  Advised her to see a dentist to see if she can get X-rays of the area her pain is.  This could also be contributing to her increase in BP.  Would recommend  resolving jaw pain before discussing treatment options for osteoporosis.  Patient agreeable to this.   Patient Goals/Self-Care Activities Patient will:  - take medications as prescribed focus on medication adherence by pill box collaborate with provider on medication access solutions  Follow Up Plan: The care management team will reach out to the patient again over the next 180 days.        Medication Assistance: Application for Eliquis  medication assistance program. in process.  Anticipated assistance start date unknown.  See plan of care for additional detail.  Compliance/Adherence/Medication fill history: Care Gaps: Zoster Vaccine  Star-Rating Drugs: 03/11/21 90ds Losartan 1066m Patient's preferred pharmacy is:  [place]one: [phone]: [phone]hone: [phone]: [phone] pill box? Yes Pt endorses 100% compliance  We discussed: Benefits of medication synchronization, packaging and delivery as well as enhanced pharmacist oversight with [employer]. Patient decided to: Continue current medication management strategy  Care Plan and Follow Up Patient Decision:  Patient agrees to Care Plan and Follow-up.  Plan: The care management team will reach out to the patient again over the next 180 days.  ChBeverly MilchPharmD Clinical Pharmacist [phone]

## 2021-06-18 ENCOUNTER — Other Ambulatory Visit: Payer: Self-pay | Admitting: Cardiology

## 2021-06-18 ENCOUNTER — Ambulatory Visit (INDEPENDENT_AMBULATORY_CARE_PROVIDER_SITE_OTHER): Payer: Medicare Other | Admitting: Pharmacist

## 2021-06-18 DIAGNOSIS — I10 Essential (primary) hypertension: Secondary | ICD-10-CM

## 2021-06-18 DIAGNOSIS — E785 Hyperlipidemia, unspecified: Secondary | ICD-10-CM

## 2021-06-18 DIAGNOSIS — I4821 Permanent atrial fibrillation: Secondary | ICD-10-CM

## 2021-06-18 DIAGNOSIS — I2510 Atherosclerotic heart disease of native coronary artery without angina pectoris: Secondary | ICD-10-CM

## 2021-06-18 NOTE — Patient Instructions (Addendum)
Visit Information   Goals Addressed             This Visit's Progress    Track and Manage My Blood Pressure-Hypertension       Timeframe:  Long-Range Goal Priority:  High Start Date: 06/18/21                            Expected End Date: 12/17/20                      Follow Up Date 10/19/20    - check blood pressure 3 times per week - choose a place to take my blood pressure (home, clinic or office, retail store) - write blood pressure results in a log or diary    Why is this important?   You won't feel high blood pressure, but it can still hurt your blood vessels.  High blood pressure can cause heart or kidney problems. It can also cause a stroke.  Making lifestyle changes like losing a little weight or eating less salt will help.  Checking your blood pressure at home and at different times of the day can help to control blood pressure.  If the doctor prescribes medicine remember to take it the way the doctor ordered.  Call the office if you cannot afford the medicine or if there are questions about it.     Notes:        Patient Care Plan: General Pharmacy (Adult)     Problem Identified: HTN, HLD, Afib, Osteoporosis   Priority: High  Onset Date: 06/18/2021     Long-Range Goal: Patient-Specific Goal   Start Date: 06/18/2021  Expected End Date: 12/17/2021  This Visit's Progress: On track  Priority: High  Note:   Current Barriers:  Unable to independently afford treatment regimen Unable to achieve control of BP   Pharmacist Clinical Goal(s):  Patient will verbalize ability to afford treatment regimen achieve adherence to monitoring guidelines and medication adherence to achieve therapeutic efficacy achieve improvement in BP as evidenced by monitoring through collaboration with PharmD and provider.   Interventions: 1:1 collaboration with [name], MD regarding development and update of comprehensive plan of care as evidenced by provider attestation and  co-signature Inter-disciplinary care team collaboration (see longitudinal plan of care) Comprehensive medication review performed; medication list updated in electronic medical record  Hypertension (BP goal <140/90) -Not ideally controlled -Current treatment: Amlodipine 10mg  daily Clonidine 0.1mg  BID Losartan 100mg  daily Spironolactone 25mg  daily -Medications previously tried: metoprolol, valsartan  -Current home readings: 182/73 P 62, 165/62 P 61 -Current exercise habits: minimal due to having to take care of her husband -Denies hypotensive/hypertensive symptoms -Educated on BP goals and benefits of medications for prevention of heart attack, stroke and kidney damage; Daily salt intake goal < 2300 mg; Exercise goal of 150 minutes per week; Importance of home blood pressure monitoring; -Counseled to monitor BP at home weekly, document, and provide log at future appointments -Recommended to continue current medication Have asked patient to monitor and record, will f/u on BP readings next month  Hyperlipidemia/CAD: (LDL goal < 70) -Controlled -Current treatment: None -Medications previously tried: none noted  -LDL is normal at last lipid panel -Educated on Cholesterol goals;  Importance of limiting foods high in cholesterol; Exercise goal of 150 minutes per week; -Recommended to continue current medication  Atrial Fibrillation (Goal: prevent stroke and major bleeding) -Controlled -CHADSVASC: 8 -Current treatment: Rate control: none  Anticoagulation: Eliquis 5mg  BID -Medications previously tried: metoprolol -Home BP and HR readings: HR is well controlled  -Counseled on increased risk of stroke due to Afib and benefits of anticoagulation for stroke prevention; bleeding risk associated with Eliquis and importance of self-monitoring for signs/symptoms of bleeding; seeking medical attention after a head injury or if there is blood in the urine/stool; -Hgb continues to be low,  however based on her weight and Scr she is on correct dose of Eliquis.  Will continue to monitor for need to decrease dose. -Recommended to continue current medication Consider dose decrease should weight be < 60kg or Scr be > 1.5.   Osteoporosis / Osteopenia (Goal Prevent fractures/maintain bone density) -Not ideally controlled -Last DEXA Scan: 02/04/21   T-Score femoral neck: -3.0  T-Score lumbar spine: -3.0 -Patient is a candidate for pharmacologic treatment due to T-Score < -2.5 in lumbar spine -Current treatment  None -Medications previously tried: Fosamax (throat irritation), Prolia (unclear if she started due to $$)  -Recommend [phone] units of vitamin D daily. Recommend 1200 mg of calcium daily from dietary and supplemental sources. Recommend weight-bearing and muscle strengthening exercises for building and maintaining bone density. -Patient is currently having some jaw pain.  She took one dose of Fosamax and her jaw pain started about 4 weeks later.  Advised her to see a dentist to see if she can get X-rays of the area her pain is.  This could also be contributing to her increase in BP.  Would recommend resolving jaw pain before discussing treatment options for osteoporosis.  Patient agreeable to this.   Patient Goals/Self-Care Activities Patient will:  - take medications as prescribed focus on medication adherence by pill box collaborate with provider on medication access solutions  Follow Up Plan: The care management team will reach out to the patient again over the next 180 days.       [name] was given information about Chronic Care Management services today including:  CCM service includes personalized support from designated clinical staff supervised by her physician, including individualized plan of care and coordination with other care providers 24/7 contact phone numbers for assistance for urgent and routine care needs. Standard insurance, coinsurance, copays and  deductibles apply for chronic care management only during months in which we provide at least 20 minutes of these services. Most insurances cover these services at 100%, however patients [name] be responsible for any copay, coinsurance and/or deductible if applicable. This service [place] help you avoid the need for more expensive face-to-face services. Only one practitioner [place] furnish and bill the service in a calendar month. The patient [place] stop CCM services at any time (effective at the end of the month) by phone call to the office staff.  Patient agreed to services and verbal consent obtained.   The patient verbalized understanding of instructions, educational materials, and care plan provided today and agreed to receive a mailed copy of patient instructions, educational materials, and care plan.  Telephone follow up appointment with pharmacy team member scheduled for: 6 months  Edythe Clarity, [place]

## 2021-06-19 ENCOUNTER — Telehealth: Payer: Self-pay | Admitting: Pharmacist

## 2021-06-19 NOTE — Progress Notes (Signed)
    Chronic Care Management Pharmacy Assistant   Name: [name]  MRN: [id number] DOB: 1935-04-13  Reason for Encounter: PAP    Medications: Outpatient Encounter Medications as of 06/19/2021  Medication Sig   acetaminophen (TYLENOL) 500 MG tablet Take 500-1,000 mg by mouth every 6 (six) hours as needed for headache (pain).   amLODipine (NORVASC) 5 MG tablet Take 2 tablets (10 mg total) by mouth daily.   apixaban (ELIQUIS) 5 MG TABS tablet Take 1 tablet (5 mg total) by mouth 2 (two) times daily.   Ascorbic Acid (VITAMIN C) 1000 MG tablet Take 1,000 mg by mouth once a week.   Cholecalciferol (VITAMIN D3) 25 MCG (1000 UT) CAPS Take 1,000 Units by mouth once a week.   clobetasol cream (TEMOVATE) 5.03 % Apply 1 application topically as needed.   clonazePAM (KLONOPIN) 0.5 MG tablet Take 1 tablet by mouth twice daily as needed for anxiety   cloNIDine (CATAPRES) 0.1 MG tablet Take 1 tablet by mouth twice daily   clotrimazole-betamethasone (LOTRISONE) cream Apply 1 application topically as needed.   famotidine (PEPCID) 20 MG tablet Take 20 mg by mouth at bedtime.   ferrous sulfate 325 (65 FE) MG tablet Take 1 tablet (325 mg total) by mouth daily with breakfast.   fluticasone (FLONASE) 50 MCG/ACT nasal spray Place 1 spray into both nostrils as needed for allergies or rhinitis.   losartan (COZAAR) 100 MG tablet Take 1 tablet by mouth once daily   nitroGLYCERIN (NITROSTAT) 0.4 MG SL tablet DISSOLVE ONE TABLET UNDER THE TONGUE EVERY 5 MINUTES AS NEEDED FOR CHEST PAIN.  DO NOT EXCEED A TOTAL OF 3 DOSES IN 15 MINUTES   spironolactone (ALDACTONE) 25 MG tablet Take 25 mg by mouth daily.   torsemide (DEMADEX) 20 MG tablet Take 4 tablets (80 mg total) by mouth daily.   No facility-administered encounter medications on file as of 06/19/2021.   New patient assistance application form filled out to [facility] for Eliquis. Waiting for patient and provider to complete and sign documentation. Called patient to  inquire if they wanted the application mailed to them or if they wanted to come into the office. Patient is required to sign application and to bring/have proof of income.She stated she would be willing to come into office to bring proof of income and sign application once the paperwork comes in the mail she would like it mailed to their residence address [place] [address].  Follow-Up:Pharmacist Review  [name], [name] Pharmacist Assistant [phone]

## 2021-06-22 ENCOUNTER — Telehealth: Payer: Self-pay | Admitting: Pharmacist

## 2021-06-22 DIAGNOSIS — N183 Chronic kidney disease, stage 3 unspecified: Secondary | ICD-10-CM | POA: Diagnosis not present

## 2021-06-22 NOTE — Progress Notes (Signed)
    Chronic Care Management Pharmacy Assistant   Name: [name]  MRN: [id number] DOB: [birth year]  Reason for Encounter: CCM Care Plan  Medications: Outpatient Encounter Medications as of 06/22/2021  Medication Sig   acetaminophen (TYLENOL) 500 MG tablet Take 500-1,000 mg by mouth every 6 (six) hours as needed for headache (pain).   amLODipine (NORVASC) 5 MG tablet Take 2 tablets (10 mg total) by mouth daily.   apixaban (ELIQUIS) 5 MG TABS tablet Take 1 tablet (5 mg total) by mouth 2 (two) times daily.   Ascorbic Acid (VITAMIN C) 1000 MG tablet Take 1,000 mg by mouth once a week.   Cholecalciferol (VITAMIN D3) 25 MCG (1000 UT) CAPS Take 1,000 Units by mouth once a week.   clobetasol cream (TEMOVATE) 4.68 % Apply 1 application topically as needed.   clonazePAM (KLONOPIN) 0.5 MG tablet Take 1 tablet by mouth twice daily as needed for anxiety   cloNIDine (CATAPRES) 0.1 MG tablet Take 1 tablet by mouth twice daily   clotrimazole-betamethasone (LOTRISONE) cream Apply 1 application topically as needed.   famotidine (PEPCID) 20 MG tablet Take 20 mg by mouth at bedtime.   ferrous sulfate 325 (65 FE) MG tablet Take 1 tablet (325 mg total) by mouth daily with breakfast.   fluticasone (FLONASE) 50 MCG/ACT nasal spray Place 1 spray into both nostrils as needed for allergies or rhinitis.   losartan (COZAAR) 100 MG tablet Take 1 tablet by mouth once daily   nitroGLYCERIN (NITROSTAT) 0.4 MG SL tablet DISSOLVE ONE TABLET UNDER THE TONGUE EVERY 5 MINUTES AS NEEDED FOR CHEST PAIN.  DO NOT EXCEED A TOTAL OF 3 DOSES IN 15 MINUTES   spironolactone (ALDACTONE) 25 MG tablet Take 25 mg by mouth daily.   torsemide (DEMADEX) 20 MG tablet Take 4 tablets (80 mg total) by mouth daily.   No facility-administered encounter medications on file as of 06/22/2021.   Reviewed the patients initial visit reinsured it was completed per the pharmacist Leata Mouse request. Printed the CCM care plan. Mailed the patient CCM care  plan to their most recent address on file.  Follow-Up:Pharmacist Review  Charlann Lange, [place] Pharmacist Assistant [phone]

## 2021-06-24 DIAGNOSIS — N183 Chronic kidney disease, stage 3 unspecified: Secondary | ICD-10-CM | POA: Diagnosis not present

## 2021-06-24 DIAGNOSIS — D631 Anemia in chronic kidney disease: Secondary | ICD-10-CM | POA: Diagnosis not present

## 2021-06-24 DIAGNOSIS — D500 Iron deficiency anemia secondary to blood loss (chronic): Secondary | ICD-10-CM | POA: Diagnosis not present

## 2021-06-25 ENCOUNTER — Ambulatory Visit (INDEPENDENT_AMBULATORY_CARE_PROVIDER_SITE_OTHER): Payer: Medicare Other | Admitting: Family Medicine

## 2021-06-25 ENCOUNTER — Other Ambulatory Visit: Payer: Self-pay

## 2021-06-25 VITALS — BP 132/88 | HR 100 | Temp 98.3°F | Resp 14 | Ht 66.0 in | Wt 181.0 lb

## 2021-06-25 DIAGNOSIS — G500 Trigeminal neuralgia: Secondary | ICD-10-CM | POA: Diagnosis not present

## 2021-06-25 MED ORDER — PREDNISONE 20 MG PO TABS: ORAL_TABLET | ORAL | 0 refills | Status: DC

## 2021-06-25 MED ORDER — HYDROCODONE-ACETAMINOPHEN 5-325 MG PO TABS: 1.0000 | ORAL_TABLET | Freq: Four times a day (QID) | ORAL | 0 refills | Status: DC | PRN

## 2021-06-25 NOTE — Progress Notes (Signed)
Subjective:    Patient ID: [name], female    DOB: 1935/05/07, 86 y.o.   MRN: [id number]  Patient is an 86 year old Caucasian female here today complaining of pain on the right side of her face.  The pain starts just anterior to the right ear and radiates down her right mandible.  It also occasionally radiates up into her right temple.  She denies any blurry vision.  There is no tenderness to palpation of the temporal artery but she is exquisitely tender to palpation near the TMJ joint.  There is no tenderness to percussion over the mandible itself.  There is no palpable abnormality in the mandible.  However palpation near the TMJ joint causes pain to radiate down the right mandible.  She describes it as a sharp lancing pain.  She went to an urgent care and was given amoxicillin 2 weeks ago and then was also given prednisone and Astelin.  She states that none of that seem to help.  However she took some hydrocodone that her husband had and that seemed to help.  Today on examination there is no inflammation or pain in her sinuses.  There is no erythema or exudate in the posterior oropharynx.  There is no lymphadenopathy in the neck.  And the tympanic membrane and auditory canal are completely clear Past Medical History:  Diagnosis Date   Allergy    rhinitis   Aortic stenosis    mild by echo 06/2017   Arthritis    Bradycardia    a. 10/2017 -> beta blocker cut back due to HR 39.   Breast cancer ([place]) 01/06/2012   Cancer ([place])    right colon and left breast   Chronic diastolic CHF (congestive heart failure) ([place])    Colon cancer ([place]) 01/06/2012   Colovesical fistula    Dr. Marlou Starks and [name] planning surgery 08/2018- surgery revealed spontaneous closure   COPD (chronic obstructive pulmonary disease) ([place])    pt. denies   Coronary artery disease 2006   a.  NSTEMI in 2016, cath showed 15% prox-mid RCA, 20% prox LAD, EF 25-35% by cath and 35-40% -> felt due to Takotsubo cardiomyopathy.   Dilated  aortic root ([place])    80mmHg by echo 06/2017   Diverticulosis    Dyspnea    Edema extremities    GERD (gastroesophageal reflux disease)    Hernia    Hiatal hernia    denies   Hyperlipidemia    Hypertension    Mild aortic stenosis    echo 11/2015 but not noted on echo 06/2016   Osteopenia    Permanent atrial fibrillation (H[facility])    chronic atrial fibrillation   Pneumonia    hx child   Pulmonary HTN ([place])    a. moderate to severe PASP 80mmHg echo 11/2015 - now 59mmHg by echo 06/2017. CTA chest in 11/16 with no PE. PFTs in 7/15 with mild obstructive lung disease. She had a negative sleep study in 2017. b. Felt due to left sided HF.   Stroke [facility])    Takotsubo syndrome 07/29/2015   a. EF 35-40% by echo; akinesis of mid-apical anteroseptal and apical myocardium.  EF now normalized on echo 11/2015   Past Surgical History:  Procedure Laterality Date   APPENDECTOMY     BREAST SURGERY     lumpectomy left   CARDIAC CATHETERIZATION     CARDIAC CATHETERIZATION N/A 07/28/2015   Procedure: Left Heart Cath and Coronary Angiography;  Surgeon: Peter M Martinique,  MD;  Location: [facility];  Service: Cardiovascular;  Laterality: N/A;   CHOLECYSTECTOMY     COLECTOMY     right side   CYSTOSCOPY WITH STENT PLACEMENT Left 09/04/2018   Procedure: CYSTOSCOPY WITH LEFT STENT PLACEMENT, BLADDER REPAIR, CYSTOSCOPY WITH LEFT STENT REMOVAL;  Surgeon: Raynelle Bring, MD;  Location: [facility];  Service: Urology;  Laterality: Left;   EXCISION OF ACCESSORY NIPPLE Bilateral 05/30/2013   Procedure: BILATERAL NIPPLE BIOPSY;  Surgeon: Merrie Roof, MD;  Location: [facility];  Service: General;  Laterality: Bilateral;   EYE SURGERY Bilateral 12   cataracts   HYSTEROSCOPY WITH D & C N/A 05/29/2020   Procedure: DILATATION AND CURETTAGE /HYSTEROSCOPY;  Surgeon: Homero Fellers, MD;  Location: [facility];  Service: Gynecology;  Laterality: N/A;   IR RADIOLOGIST EVAL & MGMT  07/26/2017   LAPAROSCOPIC RIGHT COLECTOMY N/A  09/04/2018   Procedure: LAPAROSCOPIC ASSISTED SIGMOID COLECTOMY WITH REPAIR OF FISTULA TO BLADDER;  Surgeon: Jovita Kussmaul, MD;  Location: [facility];  Service: General;  Laterality: N/A;   RIGHT/LEFT HEART CATH AND CORONARY ANGIOGRAPHY N/A 12/10/2020   Procedure: RIGHT/LEFT HEART CATH AND CORONARY ANGIOGRAPHY;  Surgeon: Larey Dresser, MD;  Location: [facility];  Service: Cardiovascular;  Laterality: N/A;   SPLIT NIGHT STUDY  02/02/2016       Current Outpatient Medications on File Prior to Visit  Medication Sig Dispense Refill   acetaminophen (TYLENOL) 500 MG tablet Take 500-1,000 mg by mouth every 6 (six) hours as needed for headache (pain).     amLODipine (NORVASC) 5 MG tablet Take 2 tablets (10 mg total) by mouth daily. 135 tablet 3   apixaban (ELIQUIS) 5 MG TABS tablet Take 1 tablet (5 mg total) by mouth 2 (two) times daily. 180 tablet 1   Ascorbic Acid (VITAMIN C) 1000 MG tablet Take 1,000 mg by mouth once a week.     Azelastine HCl 137 MCG/SPRAY SOLN Place 1 spray into both nostrils 2 (two) times daily.     Cholecalciferol (VITAMIN D3) 25 MCG (1000 UT) CAPS Take 1,000 Units by mouth once a week.     clobetasol cream (TEMOVATE) 7.59 % Apply 1 application topically as needed. 30 g 0   clonazePAM (KLONOPIN) 0.5 MG tablet Take 1 tablet by mouth twice daily as needed for anxiety 60 tablet 3   cloNIDine (CATAPRES) 0.1 MG tablet Take 1 tablet by mouth twice daily 180 tablet 1   clotrimazole-betamethasone (LOTRISONE) cream Apply 1 application topically as needed.     famotidine (PEPCID) 20 MG tablet Take 20 mg by mouth at bedtime.     ferrous sulfate 325 (65 FE) MG tablet Take 1 tablet (325 mg total) by mouth daily with breakfast. 90 tablet 3   fluticasone (FLONASE) 50 MCG/ACT nasal spray Place 1 spray into both nostrils as needed for allergies or rhinitis.     losartan (COZAAR) 100 MG tablet Take 1 tablet by mouth once daily 90 tablet 1   nitroGLYCERIN (NITROSTAT) 0.4 MG SL tablet DISSOLVE  ONE TABLET UNDER THE TONGUE EVERY 5 MINUTES AS NEEDED FOR CHEST PAIN.  DO NOT EXCEED A TOTAL OF 3 DOSES IN 15 MINUTES 25 tablet 0   spironolactone (ALDACTONE) 25 MG tablet Take 25 mg by mouth daily.     torsemide (DEMADEX) 20 MG tablet Take 4 tablets (80 mg total) by mouth daily. 120 tablet 11   No current facility-administered medications on file prior to visit.   Allergies  Allergen Reactions   Contrast Media [Iodinated Diagnostic Agents] Hives and Other (See Comments)    Per pt strong burning sensation starting in chest radiating outward    Clonidine Derivatives Other (See Comments)    Throat dry   Statins Other (See Comments)    "bones hurt"   Sulfa Antibiotics Diarrhea    Tremors    Celebrex [Celecoxib] Rash   Isosorbide Nitrate Itching and Rash   Other Itching and Rash    Plastic and paper tape and heart monitor pads   Tape Itching and Rash    Red Where applied and will spread   Social History   Socioeconomic History   Marital status: Married    Spouse name: Not on file   Number of children: Not on file   Years of education: Not on file   Highest education level: Not on file  Occupational History   Not on file  Tobacco Use   Smoking status: Never   Smokeless tobacco: Never  Vaping Use   Vaping Use: Never used  Substance and Sexual Activity   Alcohol use: No   Drug use: No   Sexual activity: Not Currently  Other Topics Concern   Not on file  Social History Narrative   Not on file   Social Determinants of Health   Financial Resource Strain: Not on file  Food Insecurity: Not on file  Transportation Needs: Not on file  Physical Activity: Not on file  Stress: Not on file  Social Connections: Not on file  Intimate Partner Violence: Not on file      Review of Systems  Musculoskeletal:  Positive for back pain.  All other systems reviewed and are negative.     Objective:   Physical Exam Vitals reviewed.  Constitutional:      General: She is not in  acute distress.    Appearance: Normal appearance. She is well-developed and normal weight. She is not diaphoretic.  HENT:     Head: Normocephalic and atraumatic.     Jaw: There is normal jaw occlusion. Tenderness present. No trismus, swelling, pain on movement or malocclusion.     Salivary Glands: Right salivary gland is not diffusely enlarged or tender. Left salivary gland is not diffusely enlarged or tender.      Right Ear: Tympanic membrane and ear canal normal.     Left Ear: Tympanic membrane and ear canal normal.     Nose: Nose normal. No congestion or rhinorrhea.     Mouth/Throat:     Mouth: Mucous membranes are moist.     Pharynx: No oropharyngeal exudate or posterior oropharyngeal erythema.  Eyes:     General:        Right eye: No discharge.        Left eye: No discharge.     Extraocular Movements: Extraocular movements intact.     Conjunctiva/sclera: Conjunctivae normal.     Pupils: Pupils are equal, round, and reactive to light.  Neck:     Thyroid: No thyromegaly.     Vascular: No JVD.  Cardiovascular:     Heart sounds: Murmur heard.    No friction rub. No gallop.  Pulmonary:     Effort: Pulmonary effort is normal. No respiratory distress.     Breath sounds: Normal breath sounds. No stridor. No wheezing, rhonchi or rales.  Chest:     Chest wall: No tenderness.  Abdominal:     Palpations: Abdomen is not rigid.  Skin:    Findings: No  lesion.  Neurological:     General: No focal deficit present.     Mental Status: She is alert and oriented to person, place, and time. Mental status is at baseline.     Cranial Nerves: No cranial nerve deficit.     Motor: No weakness or abnormal muscle tone.     Coordination: Coordination normal.     Gait: Gait normal.     Deep Tendon Reflexes: Reflexes normal.          Assessment & Plan:  Trigeminal neuralgia I believe the patient is experiencing trigeminal neuralgia.  I Minna try prednisone again as an anti-inflammatory.  She  cannot take NSAIDs due to her anticoagulant.  I will also give the patient hydrocodone for pain.  If over the next week the pain gradually improves, no further treatment is necessary.  However if the pain persist or worsens I would try Tegretol next and consider neuroimaging

## 2021-07-02 ENCOUNTER — Ambulatory Visit (INDEPENDENT_AMBULATORY_CARE_PROVIDER_SITE_OTHER): Payer: Medicare Other | Admitting: *Deleted

## 2021-07-02 ENCOUNTER — Other Ambulatory Visit: Payer: Self-pay

## 2021-07-02 DIAGNOSIS — Z23 Encounter for immunization: Secondary | ICD-10-CM

## 2021-07-06 DIAGNOSIS — E785 Hyperlipidemia, unspecified: Secondary | ICD-10-CM

## 2021-07-06 DIAGNOSIS — I4821 Permanent atrial fibrillation: Secondary | ICD-10-CM

## 2021-07-06 DIAGNOSIS — I10 Essential (primary) hypertension: Secondary | ICD-10-CM | POA: Diagnosis not present

## 2021-07-06 DIAGNOSIS — I2510 Atherosclerotic heart disease of native coronary artery without angina pectoris: Secondary | ICD-10-CM

## 2021-07-07 ENCOUNTER — Ambulatory Visit: Payer: Medicare Other | Admitting: Cardiology

## 2021-07-07 ENCOUNTER — Encounter: Payer: Self-pay | Admitting: Cardiology

## 2021-07-07 ENCOUNTER — Other Ambulatory Visit: Payer: Self-pay

## 2021-07-07 VITALS — BP 112/56 | HR 56 | Ht 63.0 in | Wt 171.0 lb

## 2021-07-07 DIAGNOSIS — I5181 Takotsubo syndrome: Secondary | ICD-10-CM

## 2021-07-07 DIAGNOSIS — R001 Bradycardia, unspecified: Secondary | ICD-10-CM

## 2021-07-07 DIAGNOSIS — E785 Hyperlipidemia, unspecified: Secondary | ICD-10-CM

## 2021-07-07 DIAGNOSIS — I272 Pulmonary hypertension, unspecified: Secondary | ICD-10-CM | POA: Diagnosis not present

## 2021-07-07 DIAGNOSIS — I4821 Permanent atrial fibrillation: Secondary | ICD-10-CM

## 2021-07-07 DIAGNOSIS — I2510 Atherosclerotic heart disease of native coronary artery without angina pectoris: Secondary | ICD-10-CM

## 2021-07-07 DIAGNOSIS — I10 Essential (primary) hypertension: Secondary | ICD-10-CM

## 2021-07-07 DIAGNOSIS — I350 Nonrheumatic aortic (valve) stenosis: Secondary | ICD-10-CM

## 2021-07-07 DIAGNOSIS — I5032 Chronic diastolic (congestive) heart failure: Secondary | ICD-10-CM

## 2021-07-07 LAB — BASIC METABOLIC PANEL
BUN/Creatinine Ratio: 36 — ABNORMAL HIGH (ref 12–28)
BUN: 41 mg/dL — ABNORMAL HIGH (ref 8–27)
CO2: 23 mmol/L (ref 20–29)
Calcium: 9.5 mg/dL (ref 8.7–10.3)
Chloride: 95 mmol/L — ABNORMAL LOW (ref 96–106)
Creatinine, Ser: 1.15 mg/dL — ABNORMAL HIGH (ref 0.57–1.00)
Glucose: 99 mg/dL (ref 70–99)
Potassium: 4.8 mmol/L (ref 3.5–5.2)
Sodium: 131 mmol/L — ABNORMAL LOW (ref 134–144)
eGFR: 47 mL/min/{1.73_m2} — ABNORMAL LOW (ref >59)

## 2021-07-07 NOTE — Addendum Note (Signed)
Addended by: Antonieta Iba on: 07/07/2021 10:19 AM   Modules accepted: Orders

## 2021-07-07 NOTE — Patient Instructions (Signed)
Call us if you have any further weight loss  Medication Instructions:  Your physician recommends that you continue on your current medications as directed. Please refer to the Current Medication list given to you today.  *If you need a refill on your cardiac medications before your next appointment, please call your pharmacy*  Follow-Up: At [facility], you and your health needs are our priority.  As part of our continuing mission to provide you with exceptional heart care, we have created designated Provider Care Teams.  These Care Teams include your primary Cardiologist (physician) and Advanced Practice Providers (APPs -  Physician Assistants and Nurse Practitioners) who all work together to provide you with the care you need, when you need it.   Your next appointment:   6 month(s)  The format for your next appointment:   In Person  Provider:   You [place] see Fransico Him, MD or one of the following Advanced Practice Providers on your designated Care Team:   Melina Copa, PA-C Ermalinda Barrios, PA-C

## 2021-07-07 NOTE — Progress Notes (Signed)
Cardiology Office Note:    Date:  07/07/2021   ID:  [name], DOB 1935/02/05, MRN [id number]  PCP:  [name], MD  Cardiologist:  Fransico Him, MD    Referring MD: [name], MD   Chief Complaint  Patient presents with   Coronary Artery Disease   Hypertension   Atrial Fibrillation   Hyperlipidemia   Aortic Stenosis     History of Present Illness:    [name] is a 86 y.o. female with a hx of Takotsubo cardiomyopathy EF 35 to 40% in 2016 with recovered EF, nonobstructive CAD on cath in 2016, hypertension, chronic diastolic CHF, pulmonary hypertension, mild aortic stenosis, permanent atrial fibrillation on Eliquis.  History of bradycardia 02/2019 at which time metoprolol was discontinued   Discharge from the hospital 07/24/2019 her observation for chest pain felt to be atypical.  Troponins negative, EKG unchanged.  Lexiscan Myoview 07/24/2019 no reversible ischemia likely breast attenuation artifact EF 70%.  She was seen by Estella Husk, PA in Dec 2020 due to elevated BP at PCP office. She had an echo that same day which showed normal LVF with mild LVH, moderate biatrial enlargement and mild MR, mild AS and pulmonary HTN which had improved from prior echo and BP was controlled at this OV.    She was seen back in [facility] clinic and underwent RHC/LHC in 4/22 showed nonobstructive CAD; mean RA 5, PA 44/10, mean PCWP 20, CI 4.89, PVR < 1 WU.  Echo in 8/22 showed EF up to 65-70%, mild LVH, normal RV, PASP 67, moderate RAE, trivial MR. PYP scan in 9/22 was probably negative. Seen back in AHF clinic in Sept 2022 and was still having SOB. Can go about 100-200 feet before being winded.  Occasional atypical chest pain.  Taking care of husband with dementia.  No orthopnea/PND.  Her Torsemide was increased to 80mg  daily  She was unable to tolerate SGLT2i due to frequent UTIs.   She also was referred to lipid clinic for [place] but that never occurred.   SHe is here today for followup  and is doing well.  She thinks that her breathing has gotten better on higher dose of Torsemide but says that sometimes she still gets SOB.  She denies any chest pain or pressure, PND, orthopnea.  She has chronic LE edema but only sporadically.  She has been having weak spells occasionally but has not passed out.  She is tearful today in the office about her situation at home caring for her husband and his dementia.  She is compliant with her meds and is tolerating meds with no SE.     Past Medical History:  Diagnosis Date   Allergy    rhinitis   Aortic stenosis    mild by echo 06/2017   Arthritis    Bradycardia    a. 10/2017 -> beta blocker cut back due to HR 39.   Breast cancer ([place]) 01/06/2012   Cancer ([place])    right colon and left breast   Chronic diastolic CHF (congestive heart failure) ([place])    Colon cancer ([place]) 01/06/2012   Colovesical fistula    Dr. Marlou Starks and [name] planning surgery 08/2018- surgery revealed spontaneous closure   COPD (chronic obstructive pulmonary disease) ([place])    pt. denies   Coronary artery disease 2006   a.  NSTEMI in 2016, cath showed 15% prox-mid RCA, 20% prox LAD, EF 25-35% by cath and 35-40% -> felt due to Takotsubo  cardiomyopathy.   Dilated aortic root ([place])    78mmHg by echo 06/2017   Diverticulosis    Dyspnea    Edema extremities    GERD (gastroesophageal reflux disease)    Hernia    Hiatal hernia    denies   Hyperlipidemia    Hypertension    Mild aortic stenosis    echo 11/2015 but not noted on echo 06/2016   Osteopenia    Permanent atrial fibrillation (HCC)    chronic atrial fibrillation   Pneumonia    hx child   Pulmonary HTN ([place])    a. moderate to severe PASP 26mmHg echo 11/2015 - now 21mmHg by echo 06/2017. CTA chest in 11/16 with no PE. PFTs in 7/15 with mild obstructive lung disease. She had a negative sleep study in 2017. b. Felt due to left sided HF.   Stroke [facility])    Takotsubo syndrome 07/29/2015   a. EF 35-40% by echo; akinesis  of mid-apical anteroseptal and apical myocardium.  EF now normalized on echo 11/2015    Past Surgical History:  Procedure Laterality Date   APPENDECTOMY     BREAST SURGERY     lumpectomy left   CARDIAC CATHETERIZATION     CARDIAC CATHETERIZATION N/A 07/28/2015   Procedure: Left Heart Cath and Coronary Angiography;  Surgeon: Peter M Martinique, MD;  Location: [place] [facility];  Service: Cardiovascular;  Laterality: N/A;   CHOLECYSTECTOMY     COLECTOMY     right side   CYSTOSCOPY WITH STENT PLACEMENT Left 09/04/2018   Procedure: CYSTOSCOPY WITH LEFT STENT PLACEMENT, BLADDER REPAIR, CYSTOSCOPY WITH LEFT STENT REMOVAL;  Surgeon: Raynelle Bring, MD;  Location: [facility];  Service: Urology;  Laterality: Left;   EXCISION OF ACCESSORY NIPPLE Bilateral 05/30/2013   Procedure: BILATERAL NIPPLE BIOPSY;  Surgeon: Merrie Roof, MD;  Location: [place];  Service: General;  Laterality: Bilateral;   EYE SURGERY Bilateral 12   cataracts   HYSTEROSCOPY WITH D & C N/A 05/29/2020   Procedure: DILATATION AND CURETTAGE /HYSTEROSCOPY;  Surgeon: Homero Fellers, MD;  Location: [facility];  Service: Gynecology;  Laterality: N/A;   IR RADIOLOGIST EVAL & MGMT  07/26/2017   LAPAROSCOPIC RIGHT COLECTOMY N/A 09/04/2018   Procedure: LAPAROSCOPIC ASSISTED SIGMOID COLECTOMY WITH REPAIR OF FISTULA TO BLADDER;  Surgeon: Jovita Kussmaul, MD;  Location: [facility];  Service: General;  Laterality: N/A;   RIGHT/LEFT HEART CATH AND CORONARY ANGIOGRAPHY N/A 12/10/2020   Procedure: RIGHT/LEFT HEART CATH AND CORONARY ANGIOGRAPHY;  Surgeon: Larey Dresser, MD;  Location: [place] [facility];  Service: Cardiovascular;  Laterality: N/A;   SPLIT NIGHT STUDY  02/02/2016        Current Medications: Current Meds  Medication Sig   acetaminophen (TYLENOL) 500 MG tablet Take 500-1,000 mg by mouth every 6 (six) hours as needed for headache (pain).   amLODipine (NORVASC) 5 MG tablet Take 2 tablets (10 mg total) by mouth daily.   apixaban  (ELIQUIS) 5 MG TABS tablet Take 1 tablet (5 mg total) by mouth 2 (two) times daily.   Ascorbic Acid (VITAMIN C) 1000 MG tablet Take 1,000 mg by mouth once a week.   Azelastine HCl 137 MCG/SPRAY SOLN Place 1 spray into both nostrils 2 (two) times daily.   Cholecalciferol (VITAMIN D3) 25 MCG (1000 UT) CAPS Take 1,000 Units by mouth once a week.   clobetasol cream (TEMOVATE) 1.91 % Apply 1 application topically as needed.   clonazePAM (KLONOPIN) 0.5 MG tablet Take  1 tablet by mouth twice daily as needed for anxiety   cloNIDine (CATAPRES) 0.1 MG tablet Take 1 tablet by mouth twice daily   famotidine (PEPCID) 20 MG tablet Take 20 mg by mouth at bedtime.   ferrous sulfate 325 (65 FE) MG tablet Take 1 tablet (325 mg total) by mouth daily with breakfast.   fluticasone (FLONASE) 50 MCG/ACT nasal spray Place 1 spray into both nostrils as needed for allergies or rhinitis.   HYDROcodone-acetaminophen (NORCO) 5-325 MG tablet Take 1 tablet by mouth every 6 (six) hours as needed for moderate pain.   losartan (COZAAR) 100 MG tablet Take 1 tablet by mouth once daily   nitroGLYCERIN (NITROSTAT) 0.4 MG SL tablet DISSOLVE ONE TABLET UNDER THE TONGUE EVERY 5 MINUTES AS NEEDED FOR CHEST PAIN.  DO NOT EXCEED A TOTAL OF 3 DOSES IN 15 MINUTES   spironolactone (ALDACTONE) 25 MG tablet Take 25 mg by mouth daily.   torsemide (DEMADEX) 20 MG tablet Take 4 tablets (80 mg total) by mouth daily.     Allergies:   Contrast media [iodinated diagnostic agents], Clonidine derivatives, Statins, Sulfa antibiotics, Celebrex [celecoxib], Isosorbide nitrate, Other, and Tape   Social History   Socioeconomic History   Marital status: Married    Spouse name: Not on file   Number of children: Not on file   Years of education: Not on file   Highest education level: Not on file  Occupational History   Not on file  Tobacco Use   Smoking status: Never   Smokeless tobacco: Never  Vaping Use   Vaping Use: Never used  Substance and  Sexual Activity   Alcohol use: No   Drug use: No   Sexual activity: Not Currently  Other Topics Concern   Not on file  Social History Narrative   Not on file   Social Determinants of Health   Financial Resource Strain: Not on file  Food Insecurity: Not on file  Transportation Needs: Not on file  Physical Activity: Not on file  Stress: Not on file  Social Connections: Not on file     Family History: The patient's family history includes Cancer in her sister and sister; Heart attack in her mother; Heart disease in her mother; Heart disease (age of onset: 76) in her brother; Hypertension in her father. There is no history of Stroke.  ROS:   Please see the history of present illness.    ROS  All other systems reviewed and negative.   EKGs/Labs/Other Studies Reviewed:    The following studies were reviewed today: Labs,  office notes   2D echo 08/2020 IMPRESSIONS    1. Left ventricular ejection fraction, by estimation, is 65 to 70%. The  left ventricle has normal function. The left ventricle has no regional  wall motion abnormalities. Left ventricular diastolic parameters are  indeterminate.   2. Right ventricular systolic function is normal. The right ventricular  size is normal. There is severely elevated pulmonary artery systolic  pressure.   3. Left atrial size was mildly dilated.   4. Right atrial size was mildly dilated.   5. The mitral valve is normal in structure. Trivial mitral valve  regurgitation. No evidence of mitral stenosis.   6. Tricuspid valve regurgitation is mild to moderate.   7. The aortic valve is tricuspid. Aortic valve regurgitation is not  visualized. Mild aortic valve stenosis.   8. Aortic dilatation noted. There is mild dilatation of the ascending  aorta, measuring 38 mm.  9. The inferior vena cava is dilated in size with >50% respiratory  variability, suggesting right atrial pressure of 8 mmHg.   Comparison(s): 08/14/19 EF 60-65%. PA  pressure 9mmHg. Mild AS 22mmHg mean,  8mmHg peak.   EKG:  EKG is not ordered today.   Recent Labs: 05/27/2021: B Natriuretic Peptide 115.6; Hemoglobin 9.3; Platelets 261 06/08/2021: BUN 42; Creatinine, Ser 1.29; Potassium 4.4; Sodium 137   Recent Lipid Panel    Component Value Date/Time   CHOL 162 07/03/2020 1004   CHOL 149 04/21/2020 0804   TRIG 43 07/03/2020 1004   HDL 71 07/03/2020 1004   HDL 68 04/21/2020 0804   CHOLHDL 2.3 07/03/2020 1004   VLDL 23 03/15/2016 1057   LDLCALC 79 07/03/2020 1004    Physical Exam:    VS:  BP (!) 112/56   Pulse (!) 56   Ht 5\' 3"  (1.6 m)   Wt 171 lb (77.6 kg)   SpO2 97%   BMI 30.29 kg/m     Wt Readings from Last 3 Encounters:  07/07/21 171 lb (77.6 kg)  06/25/21 181 lb (82.1 kg)  05/27/21 181 lb 6.4 oz (82.3 kg)    GEN: Well nourished, well developed in no acute distress HEENT: Normal NECK: No JVD; No carotid bruits LYMPHATICS: No lymphadenopathy CARDIAC:RRR, no rubs, gallops.  2/6 SM at RUSB into carotid arteries RESPIRATORY:  Clear to auscultation without rales, wheezing or rhonchi  ABDOMEN: Soft, non-tender, non-distended MUSCULOSKELETAL:  No edema; No deformity  SKIN: Warm and dry NEUROLOGIC:  Alert and oriented x 3 PSYCHIATRIC:  Normal affect   ASSESSMENT:    1. Takotsubo syndrome   2. Pulmonary HTN ([place])   3. Primary hypertension   4. Coronary artery disease involving native coronary artery of native heart without angina pectoris   5. Bradycardia   6. Chronic diastolic CHF (congestive heart failure) ([place])   7. Nonrheumatic aortic valve stenosis   8. Permanent atrial fibrillation ([place])   9. Dyslipidemia, goal LDL below 70    PLAN:    In order of problems listed above:  1.  Takotsubo DCM -resolved with EF 60-65% by echo 08/2018 and 08/2019  2.  Pulmonary HTN -PASP 32mmHg on echo 08/2019 with associated moderate to severe TR -felt secondary to [facility] 2 from diastolic CHF -Repeat 2D echocardiogram 04/28/2021  showed severe pulmonary hypertension with PASP 66.7 mmHg -RHC/LHC in 4/22 showed nonobstructive CAD; mean RA 5, PA 44/10, mean PCWP 20, CI 4.89, PVR < 1 WU (pulmonary venous hypertension).   -Followed in advanced heart failure clinic and Torsemide recently increased to 80mg  daily and it seems to have improved her SOB and edema some -Continue prescription drug management with Demadex 80 mg daily  -I have personally reviewed and interpreted outside labs performed by patient's PCP which showed SCr 1.3 and K+ 5.1   3.  HTN -BP is adequately controlled on exam today. -continue prescription drug management with losartan 100 mg daily, clonidine 0.1 mg twice daily, amlodipine 10mg  daily and spironolactone 25 mg daily with as needed refills  4.  ASCAD -non obstructive by cath 2016 and 2022 -She has chronic dyspnea on exertion but denies any angina -no ASA due to Eliquis -no BB due to bradycardia -statin intolerant  5. Bradycardia -resolved off BB  6.  Chronic diastolic CHF -NYHA class III symptoms but appear stable and improved some on higher doses of diuretics -she has chronic LE edema likely multifactorial from sedentary state, obesity, CHF and dietary indiscretion  with Na -weight is stable and she lost 10lbs since increased dose of diuretic -DOE and LE edema have improved -Continue prescription drug management with torsemide 80 mg daily and spiro 25mg  daily with as needed refills  -I asked her to let me know if she drops any further weight>I would like her to maintain her current weight as she looks euvolemic on exam -check BMET  7.  Aortic stenosis -mild by echo 08/2019 -2D echo 06/28/2021 showed normal LV function with EF 65 to 70% with mild LVH and mild aortic stenosis with mean aortic valve gradient 16.2 mmHg  8.  Permanent atrial fibrillation -She remains in atrial fibrillation with controlled ventricular response and denies any palpitations  -no bleeding problems on DOAC -Continue  prescription drug management with Eliquis 5 mg twice daily (weight>60kg and SCr < 1.5) with as needed refills -no BB due to bradycardia -I have personally reviewed and interpreted outside labs performed by patient's PCP which showed serum creatinine 1.31, potassium 5.1 and hemoglobin 10.1 on 06/22/2021  9.  HLD -LDL goal < 70 -intolerant to statin -She was referred to lipid clinic a few months ago but this did not occur -will place referral againnfor [id number]   Medication Adjustments/Labs and Tests Ordered: Current medicines are reviewed at length with the patient today.  Concerns regarding medicines are outlined above.  No orders of the defined types were placed in this encounter.  No orders of the defined types were placed in this encounter.   Signed, Fransico Him, MD  07/07/2021 10:06 AM    [place]

## 2021-07-08 ENCOUNTER — Telehealth: Payer: Self-pay | Admitting: Pharmacist

## 2021-07-08 NOTE — Progress Notes (Signed)
Chronic Care Management Pharmacy Assistant   Name: [name]  MRN: [id number] DOB: 1934-12-29  Reason for Encounter: Disease State For HTN.    Conditions to be addressed/monitored: HTN, CAD, Afib, COPD, Osteopenia, HLD, Breast cancer  Recent office visits:  06/27/21 Dr. Dennard Schaumann For jaw pain. STARTED Hydrocodone-Acetaminophen 5-325 mg 1 tablet every 6 hours PRN and Prednisone 20 mg pack. STOPPED Amoxicillin and Tramadol.   Recent consult visits:  07/07/21 Cardiology [name], MD. For follow-up. No medication changes.   Hospital visits:  None since 06/22/21  Medications: Outpatient Encounter Medications as of 07/08/2021  Medication Sig   acetaminophen (TYLENOL) 500 MG tablet Take 500-1,000 mg by mouth every 6 (six) hours as needed for headache (pain).   amLODipine (NORVASC) 5 MG tablet Take 2 tablets (10 mg total) by mouth daily.   apixaban (ELIQUIS) 5 MG TABS tablet Take 1 tablet (5 mg total) by mouth 2 (two) times daily.   Ascorbic Acid (VITAMIN C) 1000 MG tablet Take 1,000 mg by mouth once a week.   Azelastine HCl 137 MCG/SPRAY SOLN Place 1 spray into both nostrils 2 (two) times daily.   Cholecalciferol (VITAMIN D3) 25 MCG (1000 UT) CAPS Take 1,000 Units by mouth once a week.   clobetasol cream (TEMOVATE) 1.65 % Apply 1 application topically as needed.   clonazePAM (KLONOPIN) 0.5 MG tablet Take 1 tablet by mouth twice daily as needed for anxiety   cloNIDine (CATAPRES) 0.1 MG tablet Take 1 tablet by mouth twice daily   clotrimazole-betamethasone (LOTRISONE) cream Apply 1 application topically as needed. (Patient not taking: Reported on 07/07/2021)   famotidine (PEPCID) 20 MG tablet Take 20 mg by mouth at bedtime.   ferrous sulfate 325 (65 FE) MG tablet Take 1 tablet (325 mg total) by mouth daily with breakfast.   fluticasone (FLONASE) 50 MCG/ACT nasal spray Place 1 spray into both nostrils as needed for allergies or rhinitis.   HYDROcodone-acetaminophen (NORCO) 5-325 MG  tablet Take 1 tablet by mouth every 6 (six) hours as needed for moderate pain.   losartan (COZAAR) 100 MG tablet Take 1 tablet by mouth once daily   nitroGLYCERIN (NITROSTAT) 0.4 MG SL tablet DISSOLVE ONE TABLET UNDER THE TONGUE EVERY 5 MINUTES AS NEEDED FOR CHEST PAIN.  DO NOT EXCEED A TOTAL OF 3 DOSES IN 15 MINUTES   predniSONE (DELTASONE) 20 MG tablet 3 tabs poqday 1-2, 2 tabs poqday 3-4, 1 tab poqday 5-6 (Patient not taking: Reported on 07/07/2021)   spironolactone (ALDACTONE) 25 MG tablet Take 25 mg by mouth daily.   torsemide (DEMADEX) 20 MG tablet Take 4 tablets (80 mg total) by mouth daily.   No facility-administered encounter medications on file as of 07/08/2021.   Reviewed chart prior to disease state call. Spoke with patient regarding BP  Recent Office Vitals: BP Readings from Last 3 Encounters:  07/07/21 (!) 112/56  06/25/21 132/88  05/27/21 130/60   Pulse Readings from Last 3 Encounters:  07/07/21 (!) 56  06/25/21 100  05/27/21 (!) 56    Wt Readings from Last 3 Encounters:  07/07/21 171 lb (77.6 kg)  06/25/21 181 lb (82.1 kg)  05/27/21 181 lb 6.4 oz (82.3 kg)     Kidney Function Lab Results  Component Value Date/Time   CREATININE 1.15 (H) 07/07/2021 10:26 AM   CREATININE 1.29 (H) 06/08/2021 03:31 PM   CREATININE 1.01 (H) 07/03/2020 10:04 AM   CREATININE 1.03 (H) 06/03/2020 12:32 PM   CREATININE 0.8 08/08/2014 09:51 AM  CREATININE 0.8 08/09/2013 08:51 AM   GFR 90.44 01/27/2015 11:48 AM   GFRNONAA 42 (L) 05/27/2021 02:45 PM   GFRNONAA 51 (L) 07/03/2020 10:04 AM   GFRAA 60 07/03/2020 10:04 AM    BMP Latest Ref Rng & Units 07/07/2021 06/08/2021 05/27/2021  Glucose 70 - 99 mg/dL 99 131(H) 101(H)  BUN 8 - 27 mg/dL 41(H) 42(H) 44(H)  Creatinine 0.57 - 1.00 mg/dL 1.15(H) 1.29(H) 1.27(H)  BUN/Creat Ratio 12 - 28 36(H) 33(H) -  Sodium 134 - 144 mmol/L 131(L) 137 133(L)  Potassium 3.5 - 5.2 mmol/L 4.8 4.4 4.4  Chloride 96 - 106 mmol/L 95(L) 97 98  CO2 20 - 29 mmol/L  23 24 26   Calcium 8.7 - 10.3 mg/dL 9.5 9.5 9.1    Current antihypertensive regimen:  Amlodipine 10mg  daily Clonidine 0.1mg  BID Losartan 100mg  daily Spironolactone 25mg  daily  How often are you checking your Blood Pressure? Patient stated several times per month  Current home BP readings: Patient was not able to give me any blood pressure readings.   What recent interventions/DTPs have been made by any provider to improve Blood Pressure control since last CPP Visit: None.   Any recent hospitalizations or ED visits since last visit with CPP? Patient stated no.   What diet changes have been made to improve Blood Pressure Control?  Patient stated she's lost 10 pound within a month and she wasn't trying to loose any weight, She stated she believes its her medications that are causing this to occur.   What exercise is being done to improve your Blood Pressure Control?  Patient stated she is unable to do much activity.   Adherence Review: Is the patient currently on ACE/ARB medication? Losartan 100 mg  Does the patient have >5 day gap between last estimated fill dates? Per misc rpts, no.   Care Gaps:Not on the list.  Star Rating Drugs: Losartan 100 mg 06/19/21 90 DS.   Follow-Up:Pharmacist Review  Charlann Lange, [place] Pharmacist Assistant [phone]

## 2021-07-14 ENCOUNTER — Other Ambulatory Visit: Payer: Self-pay

## 2021-07-14 ENCOUNTER — Encounter: Payer: Self-pay | Admitting: Family Medicine

## 2021-07-14 ENCOUNTER — Ambulatory Visit (INDEPENDENT_AMBULATORY_CARE_PROVIDER_SITE_OTHER): Payer: Medicare Other | Admitting: Family Medicine

## 2021-07-14 VITALS — BP 110/58 | HR 58 | Temp 98.5°F | Resp 18 | Ht 63.0 in | Wt 174.0 lb

## 2021-07-14 DIAGNOSIS — I5032 Chronic diastolic (congestive) heart failure: Secondary | ICD-10-CM | POA: Diagnosis not present

## 2021-07-14 DIAGNOSIS — Z0000 Encounter for general adult medical examination without abnormal findings: Secondary | ICD-10-CM

## 2021-07-14 DIAGNOSIS — I10 Essential (primary) hypertension: Secondary | ICD-10-CM

## 2021-07-14 DIAGNOSIS — I480 Paroxysmal atrial fibrillation: Secondary | ICD-10-CM

## 2021-07-14 DIAGNOSIS — I2510 Atherosclerotic heart disease of native coronary artery without angina pectoris: Secondary | ICD-10-CM | POA: Diagnosis not present

## 2021-07-14 DIAGNOSIS — I272 Pulmonary hypertension, unspecified: Secondary | ICD-10-CM

## 2021-07-14 NOTE — Progress Notes (Signed)
Patient ID: [name], female    DOB: [birth year], 86 y.o.   MRN: [id number]  Patient is an 86 y/o WF with PMH significant for Takotsubo cardiomyopathy and nonobstructive CAD, with atrial fibrillation and was admitted in 2020 to repair a colovesical fistula.  Patient also has a history of breast cancer as well as colon cancer.  She had a mammogram performed 03/2021.  Her last colonoscopy was in 2019.  GI did not recommend any repeat colonoscopy.  Due to her age, she does not require a Pap smear.  DEXA in 02/2021 showed osteoporosis.  I recommended Prolia.  This was too expensive so she elected not to receive Prolia.  She cannot tolerate Fosamax due to throat irritation.  At the present time she is only on vitamin D.  She is not taking calcium.  She is due for a COVID booster.  She is also due for shingles shot/Shingrix Immunization History  Administered Date(s) Administered   Fluad Quad(high Dose 65+) 06/18/2019, 07/03/2020, 07/02/2021   Influenza Split 07/07/2010   Influenza, High Dose Seasonal PF 07/14/2018   Influenza,inj,Quad PF,6+ Mos 07/25/2013, 05/19/2015, 07/08/2016   Meningococcal Polysaccharide 07/08/2007   PFIZER(Purple Top)SARS-COV-2 Vaccination 10/01/2019, 10/22/2019, 07/13/2020   Pneumococcal Conjugate-13 03/14/2014   Pneumococcal Polysaccharide-23 07/15/2005   Td 04/06/2010   Tdap 05/15/2020   Zoster, Live 02/10/2015      Past Medical History:  Diagnosis Date   Allergy    rhinitis   Aortic stenosis    mild by echo 06/2017   Arthritis    Bradycardia    a. 10/2017 -> beta blocker cut back due to HR 39.   Breast cancer ([place]) 01/06/2012   Cancer ([place])    right colon and left breast   Chronic diastolic CHF (congestive heart failure) ([place])    Colon cancer ([place]) 01/06/2012   Colovesical fistula    Dr. Marlou Starks and [name] planning surgery 08/2018- surgery revealed spontaneous closure   COPD (chronic obstructive pulmonary disease) ([place])    pt. denies   Coronary artery  disease 2006   a.  NSTEMI in 2016, cath showed 15% prox-mid RCA, 20% prox LAD, EF 25-35% by cath and 35-40% -> felt due to Takotsubo cardiomyopathy.   Dilated aortic root ([place])    49mmHg by echo 06/2017   Diverticulosis    Dyspnea    Edema extremities    GERD (gastroesophageal reflux disease)    Hernia    Hiatal hernia    denies   Hyperlipidemia    Hypertension    Mild aortic stenosis    echo 11/2015 but not noted on echo 06/2016   Osteopenia    Permanent atrial fibrillation (HCC)    chronic atrial fibrillation   Pneumonia    hx child   Pulmonary HTN ([place])    a. moderate to severe PASP 37mmHg echo 11/2015 - now 44mmHg by echo 06/2017. CTA chest in 11/16 with no PE. PFTs in 7/15 with mild obstructive lung disease. She had a negative sleep study in 2017. b. Felt due to left sided HF.   Stroke [facility])    Takotsubo syndrome 07/29/2015   a. EF 35-40% by echo; akinesis of mid-apical anteroseptal and apical myocardium.  EF now normalized on echo 11/2015   Past Surgical History:  Procedure Laterality Date   APPENDECTOMY     BREAST SURGERY     lumpectomy left   CARDIAC CATHETERIZATION     CARDIAC CATHETERIZATION N/A 07/28/2015   Procedure:  Left Heart Cath and Coronary Angiography;  Surgeon: Peter M Martinique, MD;  Location: [place] [facility];  Service: Cardiovascular;  Laterality: N/A;   CHOLECYSTECTOMY     COLECTOMY     right side   CYSTOSCOPY WITH STENT PLACEMENT Left 09/04/2018   Procedure: CYSTOSCOPY WITH LEFT STENT PLACEMENT, BLADDER REPAIR, CYSTOSCOPY WITH LEFT STENT REMOVAL;  Surgeon: Raynelle Bring, MD;  [facility];  Service: Urology;  Laterality: Left;   EXCISION OF ACCESSORY NIPPLE Bilateral 05/30/2013   Procedure: BILATERAL NIPPLE BIOPSY;  Surgeon: Merrie Roof, MD;  Location: [place];  Service: General;  Laterality: Bilateral;   EYE SURGERY Bilateral 12   cataracts   HYSTEROSCOPY WITH D & C N/A 05/29/2020   Procedure: DILATATION AND CURETTAGE /HYSTEROSCOPY;  Surgeon:  Homero Fellers, MD;  Location: [facility];  Service: Gynecology;  Laterality: N/A;   IR RADIOLOGIST EVAL & MGMT  07/26/2017   LAPAROSCOPIC RIGHT COLECTOMY N/A 09/04/2018   Procedure: LAPAROSCOPIC ASSISTED SIGMOID COLECTOMY WITH REPAIR OF FISTULA TO BLADDER;  Surgeon: Jovita Kussmaul, MD;  [facility];  Service: General;  Laterality: N/A;   RIGHT/LEFT HEART CATH AND CORONARY ANGIOGRAPHY N/A 12/10/2020   Procedure: RIGHT/LEFT HEART CATH AND CORONARY ANGIOGRAPHY;  Surgeon: Larey Dresser, MD;  Location: [place] [facility];  Service: Cardiovascular;  Laterality: N/A;   SPLIT NIGHT STUDY  02/02/2016       Current Outpatient Medications on File Prior to Visit  Medication Sig Dispense Refill   acetaminophen (TYLENOL) 500 MG tablet Take 500-1,000 mg by mouth every 6 (six) hours as needed for headache (pain).     amLODipine (NORVASC) 5 MG tablet Take 2 tablets (10 mg total) by mouth daily. 135 tablet 3   apixaban (ELIQUIS) 5 MG TABS tablet Take 1 tablet (5 mg total) by mouth 2 (two) times daily. 180 tablet 1   Ascorbic Acid (VITAMIN C) 1000 MG tablet Take 1,000 mg by mouth once a week.     Azelastine HCl 137 MCG/SPRAY SOLN Place 1 spray into both nostrils 2 (two) times daily.     Cholecalciferol (VITAMIN D3) 25 MCG (1000 UT) CAPS Take 1,000 Units by mouth once a week.     clobetasol cream (TEMOVATE) 5.10 % Apply 1 application topically as needed. 30 g 0   clonazePAM (KLONOPIN) 0.5 MG tablet Take 1 tablet by mouth twice daily as needed for anxiety 60 tablet 3   cloNIDine (CATAPRES) 0.1 MG tablet Take 1 tablet by mouth twice daily 180 tablet 1   clotrimazole-betamethasone (LOTRISONE) cream Apply 1 application topically as needed.     famotidine (PEPCID) 20 MG tablet Take 20 mg by mouth at bedtime.     ferrous sulfate 325 (65 FE) MG tablet Take 1 tablet (325 mg total) by mouth daily with breakfast. 90 tablet 3   fluticasone (FLONASE) 50 MCG/ACT nasal spray Place 1 spray into both nostrils as  needed for allergies or rhinitis.     HYDROcodone-acetaminophen (NORCO) 5-325 MG tablet Take 1 tablet by mouth every 6 (six) hours as needed for moderate pain. 30 tablet 0   losartan (COZAAR) 100 MG tablet Take 1 tablet by mouth once daily 90 tablet 1   nitroGLYCERIN (NITROSTAT) 0.4 MG SL tablet DISSOLVE ONE TABLET UNDER THE TONGUE EVERY 5 MINUTES AS NEEDED FOR CHEST PAIN.  DO NOT EXCEED A TOTAL OF 3 DOSES IN 15 MINUTES 25 tablet 0   spironolactone (ALDACTONE) 25 MG tablet Take 25 mg by mouth daily.  torsemide (DEMADEX) 20 MG tablet Take 4 tablets (80 mg total) by mouth daily. 120 tablet 11   predniSONE (DELTASONE) 20 MG tablet 3 tabs poqday 1-2, 2 tabs poqday 3-4, 1 tab poqday 5-6 (Patient not taking: Reported on 07/14/2021) 12 tablet 0   No current facility-administered medications on file prior to visit.   Allergies  Allergen Reactions   Contrast Media [Iodinated Diagnostic Agents] Hives and Other (See Comments)    Per pt strong burning sensation starting in chest radiating outward    Clonidine Derivatives Other (See Comments)    Throat dry   Statins Other (See Comments)    "bones hurt"   Sulfa Antibiotics Diarrhea    Tremors    Celebrex [Celecoxib] Rash   Isosorbide Nitrate Itching and Rash   Other Itching and Rash    Plastic and paper tape and heart monitor pads   Tape Itching and Rash    Red Where applied and will spread   Social History   Socioeconomic History   Marital status: Married    Spouse name: Not on file   Number of children: Not on file   Years of education: Not on file   Highest education level: Not on file  Occupational History   Not on file  Tobacco Use   Smoking status: Never   Smokeless tobacco: Never  Vaping Use   Vaping Use: Never used  Substance and Sexual Activity   Alcohol use: No   Drug use: No   Sexual activity: Not Currently  Other Topics Concern   Not on file  Social History Narrative   Not on file   Social Determinants of Health    Financial Resource Strain: Not on file  Food Insecurity: Not on file  Transportation Needs: Not on file  Physical Activity: Not on file  Stress: Not on file  Social Connections: Not on file  Intimate Partner Violence: Not on file      Review of Systems  Musculoskeletal:  Positive for back pain.  All other systems reviewed and are negative.     Objective:   Physical Exam Vitals reviewed.  Constitutional:      General: She is not in acute distress.    Appearance: Normal appearance. She is well-developed and normal weight. She is not diaphoretic.  HENT:     Head: Normocephalic and atraumatic.     Right Ear: Tympanic membrane and ear canal normal.     Left Ear: Tympanic membrane and ear canal normal.     Nose: Nose normal. No congestion or rhinorrhea.     Mouth/Throat:     Mouth: Mucous membranes are moist.     Pharynx: No oropharyngeal exudate or posterior oropharyngeal erythema.  Eyes:     General:        Right eye: No discharge.        Left eye: No discharge.     Extraocular Movements: Extraocular movements intact.     Conjunctiva/sclera: Conjunctivae normal.     Pupils: Pupils are equal, round, and reactive to light.  Neck:     Thyroid: No thyromegaly.     Vascular: No carotid bruit or JVD.  Cardiovascular:     Rate and Rhythm: Normal rate and regular rhythm.     Heart sounds: Murmur heard.    No friction rub. No gallop.  Pulmonary:     Effort: Pulmonary effort is normal. No respiratory distress.     Breath sounds: Normal breath sounds. No stridor. No wheezing, rhonchi  or rales.  Chest:     Chest wall: No tenderness.  Abdominal:     General: Bowel sounds are normal. There is no distension.     Palpations: Abdomen is not rigid.     Tenderness: There is no abdominal tenderness. There is no guarding or rebound.     Hernia: No hernia is present.  Musculoskeletal:     Cervical back: Neck supple. No rigidity or tenderness.     Right lower leg: Edema present.      Left lower leg: Edema present.  Lymphadenopathy:     Cervical: No cervical adenopathy.  Skin:    General: Skin is warm.     Findings: No bruising, erythema, lesion or rash.  Neurological:     General: No focal deficit present.     Mental Status: She is alert and oriented to person, place, and time. Mental status is at baseline.     Cranial Nerves: No cranial nerve deficit.     Motor: No weakness or abnormal muscle tone.     Coordination: Coordination normal.     Gait: Gait normal.     Deep Tendon Reflexes: Reflexes normal.  Psychiatric:        Mood and Affect: Mood normal.        Behavior: Behavior normal.        Thought Content: Thought content normal.        Judgment: Judgment normal.          Assessment & Plan:  General medical exam  Primary hypertension  Coronary artery disease involving native coronary artery of native heart without angina pectoris  Chronic diastolic CHF (congestive heart failure) (HCC)  Pulmonary hypertension (HCC)  Paroxysmal atrial fibrillation ([place]) Today the patient is in atrial fibrillation.  She is appropriately anticoagulated.  On her last lab work, her hemoglobin had dropped from 10 which is her baseline down to 9.  I would recommend repeating that in December to make sure is not declining further.  She denies any melena or hematochezia.  I recommended the shingles vaccine as well as the COVID-vaccine.  Her mammogram is up-to-date.  Bone density test is up-to-date and shows osteoporosis.  I recommended Prolia but this is too expensive and she cannot tolerate oral bisphosphonates.  Therefore I emphasized the importance of taking calcium 1200 mg daily and vitamin D 1000 units daily.

## 2021-07-21 DIAGNOSIS — M1711 Unilateral primary osteoarthritis, right knee: Secondary | ICD-10-CM | POA: Diagnosis not present

## 2021-08-06 ENCOUNTER — Other Ambulatory Visit: Payer: Self-pay | Admitting: Family Medicine

## 2021-08-06 DIAGNOSIS — F064 Anxiety disorder due to known physiological condition: Secondary | ICD-10-CM

## 2021-08-06 DIAGNOSIS — K5792 Diverticulitis of intestine, part unspecified, without perforation or abscess without bleeding: Secondary | ICD-10-CM

## 2021-08-11 ENCOUNTER — Ambulatory Visit (INDEPENDENT_AMBULATORY_CARE_PROVIDER_SITE_OTHER): Payer: Medicare Other | Admitting: Family Medicine

## 2021-08-11 ENCOUNTER — Encounter: Payer: Self-pay | Admitting: Family Medicine

## 2021-08-11 ENCOUNTER — Other Ambulatory Visit: Payer: Self-pay

## 2021-08-11 VITALS — BP 142/62 | HR 63 | Resp 18 | Ht 63.0 in | Wt 176.0 lb

## 2021-08-11 DIAGNOSIS — D649 Anemia, unspecified: Secondary | ICD-10-CM | POA: Diagnosis not present

## 2021-08-11 DIAGNOSIS — Z1321 Encounter for screening for nutritional disorder: Secondary | ICD-10-CM | POA: Diagnosis not present

## 2021-08-11 NOTE — Progress Notes (Signed)
Subjective:    Patient ID: [name], female    DOB: 25-Apr-1935, 86 y.o.   MRN: [id number] Recently at the patient's physical exam, I was reviewing her lab work and saw that her hemoglobin had dropped from her baseline which is likely 10 to just above 9.  The patient reports dark black stool the past because she takes iron on a regular basis.  She denies any change in her stool.  Physically she denies any hematochezia.  She denies any vaginal bleeding or epistaxis hematuria.  She states that her breathing is at her baseline.  She denies any worsening shortness of breath or chest pain.  Today she shows no evidence of tachycardia or hypotension..  Her color is good.  She denies any worsening fatigue.  There is no clinical indication of worsening anemia. Past Medical History:  Diagnosis Date   Allergy    rhinitis   Aortic stenosis    mild by echo 06/2017   Arthritis    Bradycardia    a. 10/2017 -> beta blocker cut back due to HR 39.   Breast cancer ([name]) 01/06/2012   Cancer ([name])    right colon and left breast   Chronic diastolic CHF (congestive heart failure) ([place])    Colon cancer ([place]) 01/06/2012   Colovesical fistula    Dr. Marlou Starks and [name] planning surgery 08/2018- surgery revealed spontaneous closure   COPD (chronic obstructive pulmonary disease) ([place])    pt. denies   Coronary artery disease 2006   a.  NSTEMI in 2016, cath showed 15% prox-mid RCA, 20% prox LAD, EF 25-35% by cath and 35-40% -> felt due to Takotsubo cardiomyopathy.   Dilated aortic root ([place])    66mmHg by echo 06/2017   Diverticulosis    Dyspnea    Edema extremities    GERD (gastroesophageal reflux disease)    Hernia    Hiatal hernia    denies   Hyperlipidemia    Hypertension    Mild aortic stenosis    echo 11/2015 but not noted on echo 06/2016   Osteopenia    Permanent atrial fibrillation (HCC)    chronic atrial fibrillation   Pneumonia    hx child   Pulmonary HTN ([place])    a. moderate to severe PASP 7mmHg  echo 11/2015 - now 102mmHg by echo 06/2017. CTA chest in 11/16 with no PE. PFTs in 7/15 with mild obstructive lung disease. She had a negative sleep study in 2017. b. Felt due to left sided HF.   Stroke [facility])    Takotsubo syndrome 07/29/2015   a. EF 35-40% by echo; akinesis of mid-apical anteroseptal and apical myocardium.  EF now normalized on echo 11/2015   Past Surgical History:  Procedure Laterality Date   APPENDECTOMY     BREAST SURGERY     lumpectomy left   CARDIAC CATHETERIZATION     CARDIAC CATHETERIZATION N/A 07/28/2015   Procedure: Left Heart Cath and Coronary Angiography;  Surgeon: Peter M Martinique, MD;  Location: [place] [facility];  Service: Cardiovascular;  Laterality: N/A;   CHOLECYSTECTOMY     COLECTOMY     right side   CYSTOSCOPY WITH STENT PLACEMENT Left 09/04/2018   Procedure: CYSTOSCOPY WITH LEFT STENT PLACEMENT, BLADDER REPAIR, CYSTOSCOPY WITH LEFT STENT REMOVAL;  Surgeon: Raynelle Bring, MD;  [facility];  Service: Urology;  Laterality: Left;   EXCISION OF ACCESSORY NIPPLE Bilateral 05/30/2013   Procedure: BILATERAL NIPPLE BIOPSY;  Surgeon: Merrie Roof, MD;  [facility];  Service: General;  Laterality: Bilateral;   EYE SURGERY Bilateral 12   cataracts   HYSTEROSCOPY WITH D & C N/A 05/29/2020   Procedure: DILATATION AND CURETTAGE /HYSTEROSCOPY;  Surgeon: Homero Fellers, MD;  Location: [facility];  Service: Gynecology;  Laterality: N/A;   IR RADIOLOGIST EVAL & MGMT  07/26/2017   LAPAROSCOPIC RIGHT COLECTOMY N/A 09/04/2018   Procedure: LAPAROSCOPIC ASSISTED SIGMOID COLECTOMY WITH REPAIR OF FISTULA TO BLADDER;  Surgeon: Jovita Kussmaul, MD;  [facility];  Service: General;  Laterality: N/A;   RIGHT/LEFT HEART CATH AND CORONARY ANGIOGRAPHY N/A 12/10/2020   Procedure: RIGHT/LEFT HEART CATH AND CORONARY ANGIOGRAPHY;  Surgeon: Larey Dresser, MD;  Location: [place] [facility];  Service: Cardiovascular;  Laterality: N/A;   SPLIT NIGHT STUDY  02/02/2016        Current Outpatient Medications on File Prior to Visit  Medication Sig Dispense Refill   acetaminophen (TYLENOL) 500 MG tablet Take 500-1,000 mg by mouth every 6 (six) hours as needed for headache (pain).     alendronate (FOSAMAX) 70 MG tablet alendronate 70 mg tablet  TAKE 1 TABLET BY MOUTH ONCE A WEEK WITH A FULL GLASS OF WATER ON AN EMPTY STOMACH     amLODipine (NORVASC) 5 MG tablet Take 2 tablets (10 mg total) by mouth daily. 135 tablet 3   apixaban (ELIQUIS) 5 MG TABS tablet Take 1 tablet (5 mg total) by mouth 2 (two) times daily. 180 tablet 1   Ascorbic Acid (VITAMIN C) 1000 MG tablet Take 1,000 mg by mouth once a week.     Azelastine HCl 137 MCG/SPRAY SOLN Place 1 spray into both nostrils 2 (two) times daily.     Cholecalciferol (VITAMIN D3) 25 MCG (1000 UT) CAPS Take 1,000 Units by mouth once a week.     clobetasol ointment (TEMOVATE) 0.05 % clobetasol 0.05 % topical ointment  APPLY TO THE AFFECTED AREAS TWICE DAILY WHILE FLARED FOR TWO WEEKS. THEN USE ONCE OR TWICE WEEKLY AS MAINTENANCE     clonazePAM (KLONOPIN) 0.5 MG tablet Take 1 tablet by mouth twice daily as needed for anxiety 60 tablet 0   cloNIDine (CATAPRES) 0.1 MG tablet Take 1 tablet by mouth twice daily 180 tablet 1   clotrimazole-betamethasone (LOTRISONE) cream Apply 1 application topically as needed.     famotidine (PEPCID) 20 MG tablet Take 20 mg by mouth at bedtime.     ferrous sulfate 325 (65 FE) MG tablet Take 1 tablet (325 mg total) by mouth daily with breakfast. 90 tablet 3   fluticasone (FLONASE) 50 MCG/ACT nasal spray Place 1 spray into both nostrils as needed for allergies or rhinitis.     HYDROcodone-acetaminophen (NORCO) 5-325 MG tablet Take 1 tablet by mouth every 6 (six) hours as needed for moderate pain. 30 tablet 0   losartan (COZAAR) 100 MG tablet Take 1 tablet by mouth once daily 90 tablet 1   nitroGLYCERIN (NITROSTAT) 0.4 MG SL tablet DISSOLVE ONE TABLET UNDER THE TONGUE EVERY 5 MINUTES AS NEEDED FOR  CHEST PAIN.  DO NOT EXCEED A TOTAL OF 3 DOSES IN 15 MINUTES 25 tablet 0   spironolactone (ALDACTONE) 25 MG tablet Take 25 mg by mouth daily.     torsemide (DEMADEX) 20 MG tablet Take 4 tablets (80 mg total) by mouth daily. 120 tablet 11   No current facility-administered medications on file prior to visit.   Allergies  Allergen Reactions   Contrast Media [Iodinated Diagnostic Agents] Hives and  Other (See Comments)    Per pt strong burning sensation starting in chest radiating outward    Clonidine Derivatives Other (See Comments)    Throat dry   Statins Other (See Comments)    "bones hurt"   Sulfa Antibiotics Diarrhea    Tremors    Celebrex [Celecoxib] Rash   Isosorbide Nitrate Itching and Rash   Other Itching and Rash    Plastic and paper tape and heart monitor pads   Tape Itching and Rash    Red Where applied and will spread   Social History   Socioeconomic History   Marital status: Married    Spouse name: Not on file   Number of children: Not on file   Years of education: Not on file   Highest education level: Not on file  Occupational History   Not on file  Tobacco Use   Smoking status: Never   Smokeless tobacco: Never  Vaping Use   Vaping Use: Never used  Substance and Sexual Activity   Alcohol use: No   Drug use: No   Sexual activity: Not Currently  Other Topics Concern   Not on file  Social History Narrative   Not on file   Social Determinants of Health   Financial Resource Strain: Not on file  Food Insecurity: Not on file  Transportation Needs: Not on file  Physical Activity: Not on file  Stress: Not on file  Social Connections: Not on file  Intimate Partner Violence: Not on file      Review of Systems  Musculoskeletal:  Positive for back pain.  All other systems reviewed and are negative.     Objective:   Physical Exam Vitals reviewed.  Constitutional:      General: She is not in acute distress.    Appearance: Normal appearance. She is  well-developed and normal weight. She is not diaphoretic.  HENT:     Head: Normocephalic and atraumatic.     Right Ear: Tympanic membrane and ear canal normal.     Left Ear: Tympanic membrane and ear canal normal.     Nose: Nose normal. No congestion or rhinorrhea.     Mouth/Throat:     Mouth: Mucous membranes are moist.     Pharynx: No oropharyngeal exudate or posterior oropharyngeal erythema.  Eyes:     General:        Right eye: No discharge.        Left eye: No discharge.     Extraocular Movements: Extraocular movements intact.     Conjunctiva/sclera: Conjunctivae normal.     Pupils: Pupils are equal, round, and reactive to light.  Neck:     Thyroid: No thyromegaly.     Vascular: No carotid bruit or JVD.  Cardiovascular:     Rate and Rhythm: Normal rate and regular rhythm.     Heart sounds: Murmur heard.    No friction rub. No gallop.  Pulmonary:     Effort: Pulmonary effort is normal. No respiratory distress.     Breath sounds: Normal breath sounds. No stridor. No wheezing, rhonchi or rales.  Chest:     Chest wall: No tenderness.  Abdominal:     General: Bowel sounds are normal. There is no distension.     Palpations: Abdomen is not rigid.     Tenderness: There is no abdominal tenderness. There is no guarding or rebound.     Hernia: No hernia is present.  Musculoskeletal:     Cervical back: Neck supple.  No rigidity or tenderness.     Right lower leg: Edema present.     Left lower leg: Edema present.  Lymphadenopathy:     Cervical: No cervical adenopathy.  Skin:    General: Skin is warm.     Findings: No bruising, erythema, lesion or rash.  Neurological:     General: No focal deficit present.     Mental Status: She is alert and oriented to person, place, and time. Mental status is at baseline.     Cranial Nerves: No cranial nerve deficit.     Motor: No weakness or abnormal muscle tone.     Coordination: Coordination normal.     Gait: Gait normal.     Deep Tendon  Reflexes: Reflexes normal.  Psychiatric:        Mood and Affect: Mood normal.        Behavior: Behavior normal.        Thought Content: Thought content normal.        Judgment: Judgment normal.          Assessment & Plan:  Anemia, unspecified type - Plan: CBC with Differential/Platelet, Iron, Vitamin B12 I will repeat a CBC today along with an iron level and B12 level.  Patient's baseline hemoglobin is above 10.  If her hemoglobin is 10 or higher, I feel no further work-up is necessary.  However if hemoglobin continues to drop specifically if it is approaching 80, we will need to consider high therapeutic transfusion given her underlying cardiovascular disease.  I would also then institute a work-up to check for blood loss from a GI source.  Await the results of the patient's lab work but clinically she is stable

## 2021-08-12 LAB — CBC WITH DIFFERENTIAL/PLATELET
Absolute Monocytes: 644 cells/uL (ref 200–950)
Basophils Absolute: 17 cells/uL (ref 0–200)
Basophils Relative: 0.3 %
Eosinophils Absolute: 81 cells/uL (ref 15–500)
Eosinophils Relative: 1.4 %
HCT: 31.9 % — ABNORMAL LOW (ref 35.0–45.0)
Hemoglobin: 10.6 g/dL — ABNORMAL LOW (ref 11.7–15.5)
Lymphs Abs: 2024 cells/uL (ref 850–3900)
MCH: 31.4 pg (ref 27.0–33.0)
MCHC: 33.2 g/dL (ref 32.0–36.0)
MCV: 94.4 fL (ref 80.0–100.0)
MPV: 10.1 fL (ref 7.5–12.5)
Monocytes Relative: 11.1 %
Neutro Abs: 3033 cells/uL (ref 1500–7800)
Neutrophils Relative %: 52.3 %
Platelets: 241 10*3/uL (ref 140–400)
RBC: 3.38 10*6/uL — ABNORMAL LOW (ref 3.80–5.10)
RDW: 13 % (ref 11.0–15.0)
Total Lymphocyte: 34.9 %
WBC: 5.8 10*3/uL (ref 3.8–10.8)

## 2021-08-12 LAB — IRON: Iron: 116 ug/dL (ref 45–160)

## 2021-08-12 LAB — VITAMIN B12: Vitamin B-12: 379 pg/mL (ref 200–1100)

## 2021-08-13 ENCOUNTER — Other Ambulatory Visit: Payer: Self-pay | Admitting: Family Medicine

## 2021-08-13 DIAGNOSIS — F064 Anxiety disorder due to known physiological condition: Secondary | ICD-10-CM

## 2021-08-13 DIAGNOSIS — K5792 Diverticulitis of intestine, part unspecified, without perforation or abscess without bleeding: Secondary | ICD-10-CM

## 2021-08-19 DIAGNOSIS — D225 Melanocytic nevi of trunk: Secondary | ICD-10-CM | POA: Diagnosis not present

## 2021-08-19 DIAGNOSIS — L821 Other seborrheic keratosis: Secondary | ICD-10-CM | POA: Diagnosis not present

## 2021-08-19 DIAGNOSIS — Z85828 Personal history of other malignant neoplasm of skin: Secondary | ICD-10-CM | POA: Diagnosis not present

## 2021-08-19 DIAGNOSIS — L309 Dermatitis, unspecified: Secondary | ICD-10-CM | POA: Diagnosis not present

## 2021-08-21 ENCOUNTER — Other Ambulatory Visit: Payer: Self-pay | Admitting: Family Medicine

## 2021-08-26 ENCOUNTER — Other Ambulatory Visit: Payer: Self-pay

## 2021-08-26 ENCOUNTER — Ambulatory Visit (HOSPITAL_COMMUNITY)
Admission: RE | Admit: 2021-08-26 | Discharge: 2021-08-26 | Disposition: A | Discharge status: Home / Self Care | Payer: Medicare Other | Source: Ambulatory Visit | Attending: Family Medicine | Admitting: Family Medicine

## 2021-08-26 ENCOUNTER — Encounter (HOSPITAL_COMMUNITY): Payer: Self-pay

## 2021-08-26 VITALS — BP 140/56 | HR 64 | Wt 163.6 lb

## 2021-08-26 DIAGNOSIS — Z87440 Personal history of urinary (tract) infections: Secondary | ICD-10-CM | POA: Insufficient documentation

## 2021-08-26 DIAGNOSIS — Z7901 Long term (current) use of anticoagulants: Secondary | ICD-10-CM | POA: Insufficient documentation

## 2021-08-26 DIAGNOSIS — R0789 Other chest pain: Secondary | ICD-10-CM | POA: Diagnosis not present

## 2021-08-26 DIAGNOSIS — M7989 Other specified soft tissue disorders: Secondary | ICD-10-CM | POA: Diagnosis not present

## 2021-08-26 DIAGNOSIS — I2510 Atherosclerotic heart disease of native coronary artery without angina pectoris: Secondary | ICD-10-CM | POA: Insufficient documentation

## 2021-08-26 DIAGNOSIS — I10 Essential (primary) hypertension: Secondary | ICD-10-CM | POA: Diagnosis not present

## 2021-08-26 DIAGNOSIS — I482 Chronic atrial fibrillation, unspecified: Secondary | ICD-10-CM | POA: Insufficient documentation

## 2021-08-26 DIAGNOSIS — I5032 Chronic diastolic (congestive) heart failure: Secondary | ICD-10-CM | POA: Diagnosis not present

## 2021-08-26 DIAGNOSIS — I110 Hypertensive heart disease with heart failure: Secondary | ICD-10-CM | POA: Diagnosis not present

## 2021-08-26 DIAGNOSIS — I428 Other cardiomyopathies: Secondary | ICD-10-CM | POA: Diagnosis not present

## 2021-08-26 DIAGNOSIS — I4821 Permanent atrial fibrillation: Secondary | ICD-10-CM | POA: Diagnosis not present

## 2021-08-26 DIAGNOSIS — I272 Pulmonary hypertension, unspecified: Secondary | ICD-10-CM | POA: Insufficient documentation

## 2021-08-26 DIAGNOSIS — Z79899 Other long term (current) drug therapy: Secondary | ICD-10-CM | POA: Insufficient documentation

## 2021-08-26 LAB — BASIC METABOLIC PANEL
Anion gap: 8 (ref 5–15)
BUN: 47 mg/dL — ABNORMAL HIGH (ref 8–23)
CO2: 23 mmol/L (ref 22–32)
Calcium: 9.2 mg/dL (ref 8.9–10.3)
Chloride: 101 mmol/L (ref 98–111)
Creatinine, Ser: 1.29 mg/dL — ABNORMAL HIGH (ref 0.44–1.00)
GFR, Estimated: 41 mL/min — ABNORMAL LOW (ref >60)
Glucose, Bld: 122 mg/dL — ABNORMAL HIGH (ref 70–99)
Potassium: 4.1 mmol/L (ref 3.5–5.1)
Sodium: 132 mmol/L — ABNORMAL LOW (ref 135–145)

## 2021-08-26 NOTE — Progress Notes (Addendum)
Patient ID: Saliah Crisp Soucek, female   DOB: 15-Oct-1934, 86 y.o.   MRN: [id number]    Advanced Heart Failure Clinic Note   PCP: [name], MD Cardiology: [name] HF Cardiology: Aundra Dubin  86 y.o. with history of poorly controlled HTN, chronic atrial fibrillation, chronic diastolic CHF, and prior episode of Takotsubo cardiomyopathy was referred by to HF clinic by Dr. [name]. Patient has had exertional dyspnea since 11/16.  At that time, she presented to the hospital with chest pain.  Coronary angiography showed no significant CAD.  EF was 35-40% by echo, suspected Takotsubo cardiomyopathy.  Her echo in 3/17 showed EF improved to 40-98% but PA systolic pressure elevated at 65 mmHg.  We transitioned her to torsemide.   RHC/LHC in 4/22 showed nonobstructive CAD; mean RA 5, PA 44/10, mean PCWP 20, CI 4.89, PVR < 1 WU.  Echo in 8/22 showed EF up to 65-70%, mild LVH, normal RV, PASP 67, moderate RAE, trivial MR. PYP scan in 9/22 was probably negative.   Today she returns for HF follow up. Overall feeling fine. Occasional atypical chest pain. Took a nitro this past week. Typically does not take Nitro more than a couple times a month. She can walk about 100-200 feet before being winded. Continues to have some LE edema. Denies palpitations, abnormal bleeding, dizziness, or PND/Orthopnea. Appetite fair. No fever or chills. Weight at home stable. Taking all medications. She is taking care of her husband who has severe dementia.  ECG (personally reviewed): none ordered today.  REDS clip 32%  Labs (11/16): LDL 80 Labs (4/17): K 4.5, creatinine 1.1, BNP 132 Labs (5/17): LDL 92 Labs (3/18): K 4, creatinine 0.98, LDL 81, proBNP 834 Labs (6/18): K 4.1, creatinine 1.37, hgb 12.5 Labs (10/21): LDL 79, HDL 71 Labs (5/22): myeloma panel and urine immunofixation negative Labs (7/22): K 4.4, creatinine 1.19 Labs (11/22): K 4.8, creatinine 1.15 Labs (12/22): hgb 10.6  PMH: 1. Hyperlipidemia: Myalgias with  atorvastatin, Crestor, simvastatin. Myalgias with Zetia.  2. Chronic atrial fibrillation 3. HTN: Poor control.  4. H/o breast cancer: 2013.  5. GERD 6. Takotsubo cardiomyopathy: Admitted in 11/16 with chest pain.  Coronary angiography showed nonobstructive CAD.  Echo (11/16) showed EF 35-40%.  EF back to normal by 3/17 echo.  7. Chronic diastolic CHF - Echo (1/19) with EF 60-65%, mild aortic stenosis, PA systolic pressure 65 mmHg, RV mildly dilated with normal systolic function.  - Cardiolite (4/17) with EF 61%, small reversible mid anteroseptal/apical lateral defects thought to be related to shifting breast artifact => low risk study.  - Echo (12/21): EF 65-70%, normal RV, PASP 60 mmHg, mild AS, mild-moderate TR.   - RHC/LHC in 4/22 showed nonobstructive CAD; mean RA 5, PA 44/10, mean PCWP 20, CI 4.89, PVR < 1 WU.   - Echo in 8/22 showed EF up to 65-70%, mild LVH, normal RV, PASP 67, moderate RAE, trivial MR.  - PYP scan (9/22): H/CL < 1.5, grade 1.  Probably negative.   8. Ascending aortic aneurysm: 4.3 cm by CTA in 11/16.  9. Pulmonary hypertension: PASP 65 mmHg by echo in 3/17.  CTA chest in 11/16 with no PE.  PFTs in 7/15 with mild obstructive lung disease.  10. Sleep study negative 2017. 11. Bradycardia: off nodal blockers.  - Holter (8/20): Average HR 51, afib 12. Aortic stenosis: Mild.   Social History   Socioeconomic History   Marital status: Married    Spouse name: Not on file   Number of  children: Not on file   Years of education: Not on file   Highest education level: Not on file  Occupational History   Not on file  Tobacco Use   Smoking status: Never   Smokeless tobacco: Never  Vaping Use   Vaping Use: Never used  Substance and Sexual Activity   Alcohol use: No   Drug use: No   Sexual activity: Not Currently  Other Topics Concern   Not on file  Social History Narrative   Not on file   Social Determinants of Health   Financial Resource Strain: Not on file   Food Insecurity: Not on file  Transportation Needs: Not on file  Physical Activity: Not on file  Stress: Not on file  Social Connections: Not on file  Intimate Partner Violence: Not on file   Family History  Problem Relation Age of Onset   Heart disease Mother    Heart attack Mother    Cancer Sister        stomach and colon   Heart disease Brother 31   Hypertension Father    Cancer Sister    Stroke Neg Hx    ROS: All systems reviewed and negative except as per HPI.   Current Outpatient Medications  Medication Sig Dispense Refill   acetaminophen (TYLENOL) 500 MG tablet Take 500-1,000 mg by mouth every 6 (six) hours as needed for headache (pain).     amLODipine (NORVASC) 5 MG tablet Take 2 tablets (10 mg total) by mouth daily. 135 tablet 3   apixaban (ELIQUIS) 5 MG TABS tablet Take 1 tablet (5 mg total) by mouth 2 (two) times daily. 180 tablet 1   Ascorbic Acid (VITAMIN C) 1000 MG tablet Take 1,000 mg by mouth once a week.     Azelastine HCl 137 MCG/SPRAY SOLN Place 1 spray into both nostrils 2 (two) times daily.     Cholecalciferol (VITAMIN D3) 25 MCG (1000 UT) CAPS Take 1,000 Units by mouth once a week.     clobetasol ointment (TEMOVATE) 0.05 % clobetasol 0.05 % topical ointment  APPLY TO THE AFFECTED AREAS TWICE DAILY WHILE FLARED FOR TWO WEEKS. THEN USE ONCE OR TWICE WEEKLY AS MAINTENANCE     clonazePAM (KLONOPIN) 0.5 MG tablet Take 1 tablet by mouth twice daily as needed for anxiety 60 tablet 0   cloNIDine (CATAPRES) 0.1 MG tablet Take 1 tablet by mouth twice daily 180 tablet 1   clotrimazole-betamethasone (LOTRISONE) cream Apply 1 application topically as needed.     famotidine (PEPCID) 20 MG tablet Take 20 mg by mouth at bedtime. As needed     ferrous sulfate 325 (65 FE) MG tablet Take 1 tablet (325 mg total) by mouth daily with breakfast. 90 tablet 3   fluticasone (FLONASE) 50 MCG/ACT nasal spray Place 1 spray into both nostrils as needed for allergies or rhinitis.      HYDROcodone-acetaminophen (NORCO) 5-325 MG tablet Take 1 tablet by mouth every 6 (six) hours as needed for moderate pain. 30 tablet 0   losartan (COZAAR) 100 MG tablet Take 1 tablet by mouth once daily 90 tablet 1   nitroGLYCERIN (NITROSTAT) 0.4 MG SL tablet DISSOLVE ONE TABLET UNDER THE TONGUE EVERY 5 MINUTES AS NEEDED FOR CHEST PAIN.  DO NOT EXCEED A TOTAL OF 3 DOSES IN 15 MINUTES 25 tablet 0   spironolactone (ALDACTONE) 25 MG tablet Take 25 mg by mouth daily.     torsemide (DEMADEX) 20 MG tablet Take 4 tablets (80 mg total) by  mouth daily. 120 tablet 11   No current facility-administered medications for this encounter.   BP (!) 140/56    Pulse 64    Wt 74.2 kg (163 lb 9.6 oz)    SpO2 97%    BMI 28.98 kg/m    Wt Readings from Last 3 Encounters:  08/26/21 74.2 kg (163 lb 9.6 oz)  08/11/21 79.8 kg (176 lb)  07/14/21 78.9 kg (174 lb)    General:  NAD. No resp difficulty HEENT: Normal Neck: Supple. No JVD. Carotids 2+ bilat; no bruits. No lymphadenopathy or thryomegaly appreciated. Cor: PMI nondisplaced. Irregular rate & rhythm. No rubs, gallops, 2/6 SEM RUSB Lungs: Clear Abdomen: Soft, nontender, nondistended. No hepatosplenomegaly. No bruits or masses. Good bowel sounds. Extremities: No cyanosis, clubbing, rash, 1-2+ BLE pre-tibial edema Neuro: Alert & oriented x 3, cranial nerves grossly intact. Moves all 4 extremities w/o difficulty. Affect pleasant.  Assessment/Plan: 1. Chronic atrial fibrillation: She is off nodal blockade with HR in 50s.  - Continue Eliquis 5 mg bid.   No bleeding issues. Recent CBC ok. 2. HTN: BP controlled.  Continue current regimen.  - Continue losartan 100 mg daily. - Continue amlodipine 10 mg daily. - Continue clonidine 0.1 mg bid. 3. Chronic diastolic CHF: NYHA class III symptoms, slowly progressive over time.  Mild volume overload on exam.  RHC/LHC in 4/22 showed nonobstructive CAD; mean RA 5, PA 44/10, mean PCWP 20, CI 4.89, PVR < 1 WU (pulmonary venous  hypertension).  Echo in 8/22 showed EF up to 65-70%, mild LVH, normal RV, PASP 67, moderate RAE, trivial MR. PYP scan in 9/22 was probably negative. She is not overloaded by exam or REDS clip. ? Pedal edema due to CCB. Weight down 18 lbs from previous visit. - Advised wearing her compression hose and elevating legs at home for LE swelling. - Continue spironolactone 25 mg daily. - Continue torsemide 80 mg daily. BMET today. - Unable to tolerate Jardiance due to frequent UTIs.  4. CAD: Nonobstructive on 4/22 cath. She has been intolerant of statins and Zetia.  Continues with occasional atypical chest pain. No change from prior. - She has been referred to Lipid Clinic by Drs Aundra Dubin and [name]. - No ASA given apixaban use.   Followup in 3 months with Dr. Wynema Birch [facility] FNP 08/26/2021

## 2021-08-26 NOTE — Progress Notes (Signed)
ReDS Vest / Clip - 08/26/21 1300       ReDS Vest / Clip   Station Marker A    Ruler Value 25    ReDS Value Range Low volume    ReDS Actual Value 32

## 2021-08-26 NOTE — Patient Instructions (Signed)
It was great to see you today! No medication changes are needed at this time.   Labs today We will only contact you if something comes back abnormal or we need to make some changes. Otherwise no news is good news!  Please wear your compression hose daily, place them on as soon as you get up in the morning and remove before you go to bed at night. And elevate your legs throughout the day while at home   Your physician recommends that you schedule a follow-up appointment in: 3 months with Dr Aundra Dubin  Do the following things EVERYDAY: Weigh yourself in the morning before breakfast. Write it down and keep it in a log. Take your medicines as prescribed Eat low salt foods--Limit salt (sodium) to 2000 mg per day.  Stay as active as you can everyday Limit all fluids for the day to less than 2 liters   At the [facility], you and your health needs are our priority. As part of our continuing mission to provide you with exceptional heart care, we have created designated Provider Care Teams. These Care Teams include your primary Cardiologist (physician) and Advanced Practice Providers (APPs- Physician Assistants and Nurse Practitioners) who all work together to provide you with the care you need, when you need it.   You [place] see any of the following providers on your designated Care Team at your next follow up: Dr Glori Bickers Dr Haynes Kerns, NP [name], [place] [facility] [place], [place] Audry Riles, PharmD   Please be sure to bring in all your medications bottles to every appointment.

## 2021-09-04 ENCOUNTER — Ambulatory Visit: Payer: Medicare Other | Admitting: Family Medicine

## 2021-09-08 ENCOUNTER — Telehealth: Payer: Self-pay

## 2021-09-08 DIAGNOSIS — M170 Bilateral primary osteoarthritis of knee: Secondary | ICD-10-CM | POA: Diagnosis not present

## 2021-09-08 NOTE — Telephone Encounter (Signed)
Patient called complaining of bloody nose and chest discomfort.  Advised patient to go to hospital but she declined as she says she needs to stay with her husband. Patient has been using increased amount of nasal spray.  Advised her to stop this and get a decongestant and if no better to go to the hospital.

## 2021-09-15 ENCOUNTER — Encounter: Payer: Self-pay | Admitting: Family Medicine

## 2021-09-15 ENCOUNTER — Ambulatory Visit (INDEPENDENT_AMBULATORY_CARE_PROVIDER_SITE_OTHER): Payer: Medicare Other | Admitting: Family Medicine

## 2021-09-15 ENCOUNTER — Other Ambulatory Visit: Payer: Self-pay

## 2021-09-15 VITALS — BP 122/48 | HR 61 | Temp 98.0°F | Resp 18 | Ht 63.0 in | Wt 172.0 lb

## 2021-09-15 DIAGNOSIS — R0789 Other chest pain: Secondary | ICD-10-CM

## 2021-09-15 MED ORDER — HYDROCODONE-ACETAMINOPHEN 5-325 MG PO TABS: 1.0000 | ORAL_TABLET | Freq: Four times a day (QID) | ORAL | 0 refills | Status: DC | PRN

## 2021-09-15 MED ORDER — PREDNISONE 20 MG PO TABS: ORAL_TABLET | ORAL | 0 refills | Status: DC

## 2021-09-15 NOTE — Progress Notes (Signed)
Subjective:    Patient ID: [name], female    DOB: 05/20/1935, 86 y.o.   MRN: [id number] Little more than a week ago, the patient was leaning forward to take off her compression hose when she felt a pop in her chest.  Ever since that time she has had sharp pain over the end of her sternum.  She is extremely tender to palpation in that area.  I am able to reproduce the pain by palpation near the xiphoid process and on the distal sternum.  She is also tender to palpation over the anterior ribs.  She denies any angina.  She denies any cough or hemoptysis.  She denies any shortness of breath.  She does complain with pain in the chest wall with deep inspiration.  She denies any exacerbation of the pain with food.  Also believe the pain is made worse by anxiety and stress.  She is tearful today discussing the situation with her husband.  She seems overwhelmed by his dementia.  She also continues to have severe pain in her right knee and is failed cortisone injections both here and with orthopedics.  She is unable to take NSAIDs because of her Eliquis and Tylenol is providing her little relief Past Medical History:  Diagnosis Date   Allergy    rhinitis   Aortic stenosis    mild by echo 06/2017   Arthritis    Bradycardia    a. 10/2017 -> beta blocker cut back due to HR 39.   Breast cancer ([place]) 01/06/2012   Cancer ([place])    right colon and left breast   Chronic diastolic CHF (congestive heart failure) ([facility])    Colon cancer ([place]) 01/06/2012   Colovesical fistula    Dr. Marlou Starks and [name] planning surgery 08/2018- surgery revealed spontaneous closure   COPD (chronic obstructive pulmonary disease) ([place])    pt. denies   Coronary artery disease 2006   a.  NSTEMI in 2016, cath showed 15% prox-mid RCA, 20% prox LAD, EF 25-35% by cath and 35-40% -> felt due to Takotsubo cardiomyopathy.   Dilated aortic root ([place])    23mmHg by echo 06/2017   Diverticulosis    Dyspnea    Edema extremities    GERD  (gastroesophageal reflux disease)    Hernia    Hiatal hernia    denies   Hyperlipidemia    Hypertension    Mild aortic stenosis    echo 11/2015 but not noted on echo 06/2016   Osteopenia    Permanent atrial fibrillation ([facility])    chronic atrial fibrillation   Pneumonia    hx child   Pulmonary HTN ([place])    a. moderate to severe PASP 51mmHg echo 11/2015 - now 67mmHg by echo 06/2017. CTA chest in 11/16 with no PE. PFTs in 7/15 with mild obstructive lung disease. She had a negative sleep study in 2017. b. Felt due to left sided HF.   Stroke [facility])    Takotsubo syndrome 07/29/2015   a. EF 35-40% by echo; akinesis of mid-apical anteroseptal and apical myocardium.  EF now normalized on echo 11/2015   Past Surgical History:  Procedure Laterality Date   APPENDECTOMY     BREAST SURGERY     lumpectomy left   CARDIAC CATHETERIZATION     CARDIAC CATHETERIZATION N/A 07/28/2015   Procedure: Left Heart Cath and Coronary Angiography;  Surgeon: Peter M Martinique, MD;  Location: [place] [facility];  Service: Cardiovascular;  Laterality: N/A;  CHOLECYSTECTOMY     COLECTOMY     right side   CYSTOSCOPY WITH STENT PLACEMENT Left 09/04/2018   Procedure: CYSTOSCOPY WITH LEFT STENT PLACEMENT, BLADDER REPAIR, CYSTOSCOPY WITH LEFT STENT REMOVAL;  Surgeon: Raynelle Bring, MD;  Location: [facility];  Service: Urology;  Laterality: Left;   EXCISION OF ACCESSORY NIPPLE Bilateral 05/30/2013   Procedure: BILATERAL NIPPLE BIOPSY;  Surgeon: Merrie Roof, MD;  Location: [place];  Service: General;  Laterality: Bilateral;   EYE SURGERY Bilateral 12   cataracts   HYSTEROSCOPY WITH D & C N/A 05/29/2020   Procedure: DILATATION AND CURETTAGE /HYSTEROSCOPY;  Surgeon: Homero Fellers, MD;  Location: [facility];  Service: Gynecology;  Laterality: N/A;   IR RADIOLOGIST EVAL & MGMT  07/26/2017   LAPAROSCOPIC RIGHT COLECTOMY N/A 09/04/2018   Procedure: LAPAROSCOPIC ASSISTED SIGMOID COLECTOMY WITH REPAIR OF FISTULA TO BLADDER;   Surgeon: Jovita Kussmaul, MD;  Location: [facility];  Service: General;  Laterality: N/A;   RIGHT/LEFT HEART CATH AND CORONARY ANGIOGRAPHY N/A 12/10/2020   Procedure: RIGHT/LEFT HEART CATH AND CORONARY ANGIOGRAPHY;  Surgeon: Larey Dresser, MD;  Location: [place] [facility];  Service: Cardiovascular;  Laterality: N/A;   SPLIT NIGHT STUDY  02/02/2016       Current Outpatient Medications on File Prior to Visit  Medication Sig Dispense Refill   acetaminophen (TYLENOL) 500 MG tablet Take 500-1,000 mg by mouth every 6 (six) hours as needed for headache (pain).     amLODipine (NORVASC) 5 MG tablet Take 2 tablets (10 mg total) by mouth daily. 135 tablet 3   apixaban (ELIQUIS) 5 MG TABS tablet Take 1 tablet (5 mg total) by mouth 2 (two) times daily. 180 tablet 1   Ascorbic Acid (VITAMIN C) 1000 MG tablet Take 1,000 mg by mouth once a week.     Azelastine HCl 137 MCG/SPRAY SOLN Place 1 spray into both nostrils 2 (two) times daily.     Cholecalciferol (VITAMIN D3) 25 MCG (1000 UT) CAPS Take 1,000 Units by mouth once a week.     clobetasol ointment (TEMOVATE) 0.05 % clobetasol 0.05 % topical ointment  APPLY TO THE AFFECTED AREAS TWICE DAILY WHILE FLARED FOR TWO WEEKS. THEN USE ONCE OR TWICE WEEKLY AS MAINTENANCE     clonazePAM (KLONOPIN) 0.5 MG tablet Take 1 tablet by mouth twice daily as needed for anxiety 60 tablet 0   cloNIDine (CATAPRES) 0.1 MG tablet Take 1 tablet by mouth twice daily 180 tablet 1   clotrimazole-betamethasone (LOTRISONE) cream Apply 1 application topically as needed.     famotidine (PEPCID) 20 MG tablet Take 20 mg by mouth at bedtime. As needed     ferrous sulfate 325 (65 FE) MG tablet Take 1 tablet (325 mg total) by mouth daily with breakfast. 90 tablet 3   fluticasone (FLONASE) 50 MCG/ACT nasal spray Place 1 spray into both nostrils as needed for allergies or rhinitis.     HYDROcodone-acetaminophen (NORCO) 5-325 MG tablet Take 1 tablet by mouth every 6 (six) hours as needed for moderate  pain. 30 tablet 0   losartan (COZAAR) 100 MG tablet Take 1 tablet by mouth once daily 90 tablet 1   nitroGLYCERIN (NITROSTAT) 0.4 MG SL tablet DISSOLVE ONE TABLET UNDER THE TONGUE EVERY 5 MINUTES AS NEEDED FOR CHEST PAIN.  DO NOT EXCEED A TOTAL OF 3 DOSES IN 15 MINUTES 25 tablet 0   spironolactone (ALDACTONE) 25 MG tablet Take 25 mg by mouth daily.     torsemide (  DEMADEX) 20 MG tablet Take 4 tablets (80 mg total) by mouth daily. 120 tablet 11   No current facility-administered medications on file prior to visit.   Allergies  Allergen Reactions   Contrast Media [Iodinated Contrast Media] Hives and Other (See Comments)    Per pt strong burning sensation starting in chest radiating outward    Clonidine Derivatives Other (See Comments)    Throat dry   Statins Other (See Comments)    "bones hurt"   Sulfa Antibiotics Diarrhea    Tremors    Celebrex [Celecoxib] Rash   Isosorbide Nitrate Itching and Rash   Other Itching and Rash    Plastic and paper tape and heart monitor pads   Tape Itching and Rash    Red Where applied and will spread   Social History   Socioeconomic History   Marital status: Married    Spouse name: Not on file   Number of children: Not on file   Years of education: Not on file   Highest education level: Not on file  Occupational History   Not on file  Tobacco Use   Smoking status: Never   Smokeless tobacco: Never  Vaping Use   Vaping Use: Never used  Substance and Sexual Activity   Alcohol use: No   Drug use: No   Sexual activity: Not Currently  Other Topics Concern   Not on file  Social History Narrative   Not on file   Social Determinants of Health   Financial Resource Strain: Not on file  Food Insecurity: Not on file  Transportation Needs: Not on file  Physical Activity: Not on file  Stress: Not on file  Social Connections: Not on file  Intimate Partner Violence: Not on file      Review of Systems  Musculoskeletal:  Positive for back  pain.  All other systems reviewed and are negative.     Objective:   Physical Exam Vitals reviewed.  Constitutional:      General: She is not in acute distress.    Appearance: Normal appearance. She is well-developed and normal weight. She is not diaphoretic.  HENT:     Right Ear: Tympanic membrane and ear canal normal.     Left Ear: Tympanic membrane and ear canal normal.     Nose: Nose normal. No congestion or rhinorrhea.     Mouth/Throat:     Mouth: Mucous membranes are moist.     Pharynx: No oropharyngeal exudate or posterior oropharyngeal erythema.  Eyes:     General:        Right eye: No discharge.        Left eye: No discharge.     Conjunctiva/sclera: Conjunctivae normal.  Neck:     Vascular: No carotid bruit.  Cardiovascular:     Rate and Rhythm: Normal rate and regular rhythm.     Heart sounds: Murmur heard.    No friction rub. No gallop.  Pulmonary:     Effort: Pulmonary effort is normal. No accessory muscle usage, prolonged expiration or respiratory distress.     Breath sounds: Normal breath sounds and air entry. No stridor, decreased air movement or transmitted upper airway sounds. No decreased breath sounds, wheezing, rhonchi or rales.  Chest:     Chest wall: Tenderness present. No deformity or swelling.    Abdominal:     General: Bowel sounds are normal. There is no distension.     Palpations: Abdomen is not rigid.     Tenderness: There  is no abdominal tenderness. There is no guarding or rebound.     Hernia: No hernia is present.  Musculoskeletal:     Cervical back: No rigidity or tenderness.     Right knee: Decreased range of motion. Tenderness present over the medial joint line and lateral joint line.     Right lower leg: Tenderness present.     Left lower leg: Tenderness present.  Skin:    Findings: No bruising or lesion.  Neurological:     Mental Status: She is alert. Mental status is at baseline.     Motor: No abnormal muscle tone.      Coordination: Coordination normal.     Gait: Gait normal.     Deep Tendon Reflexes: Reflexes normal.  Psychiatric:        Mood and Affect: Mood normal.        Behavior: Behavior normal.        Thought Content: Thought content normal.        Judgment: Judgment normal.          Assessment & Plan:  Chest wall pain Patient has chest wall pain.  I believe she injured the xiphoid process when she was bending forward to take off her compression hose.  I do not feel that this is a cardiovascular issue or pulmonary issue for a GI issue.  I am able to reproduce the pain by palpation of the distal end of the sternum.  Also believes she is having significant pain in her knees unrelieved by cortisone injections as well as Tylenol and she is unable to take NSAIDs due to Eliquis.  Therefore I will give the patient prednisone temporarily for 6 days due to arthritis in her knee to try to give her temporary relief.  I will also give her Norco 5/325 1 p.o. every 6 hours for severe pain.  Seek medical attention immediately if chest pain changes or worsens or if he develops fevers or hemoptysis or shortness of breath.

## 2021-09-16 DIAGNOSIS — H35373 Puckering of macula, bilateral: Secondary | ICD-10-CM | POA: Diagnosis not present

## 2021-09-16 DIAGNOSIS — H02052 Trichiasis without entropian right lower eyelid: Secondary | ICD-10-CM | POA: Diagnosis not present

## 2021-09-22 DIAGNOSIS — M170 Bilateral primary osteoarthritis of knee: Secondary | ICD-10-CM | POA: Diagnosis not present

## 2021-10-01 ENCOUNTER — Other Ambulatory Visit: Payer: Self-pay | Admitting: Family Medicine

## 2021-10-01 DIAGNOSIS — K5792 Diverticulitis of intestine, part unspecified, without perforation or abscess without bleeding: Secondary | ICD-10-CM

## 2021-10-01 DIAGNOSIS — F064 Anxiety disorder due to known physiological condition: Secondary | ICD-10-CM

## 2021-10-02 NOTE — Telephone Encounter (Signed)
Clonazepam refill request.  Last seen 09/15/2021,last filled 08/07/2021.

## 2021-10-05 ENCOUNTER — Other Ambulatory Visit: Payer: Self-pay

## 2021-10-05 ENCOUNTER — Ambulatory Visit (INDEPENDENT_AMBULATORY_CARE_PROVIDER_SITE_OTHER): Payer: Medicare Other | Admitting: Family Medicine

## 2021-10-05 ENCOUNTER — Telehealth: Payer: Self-pay | Admitting: Cardiology

## 2021-10-05 ENCOUNTER — Encounter: Payer: Self-pay | Admitting: Family Medicine

## 2021-10-05 VITALS — BP 122/38 | HR 68 | Temp 98.2°F | Resp 18 | Ht 63.0 in | Wt 179.0 lb

## 2021-10-05 DIAGNOSIS — I272 Pulmonary hypertension, unspecified: Secondary | ICD-10-CM | POA: Diagnosis not present

## 2021-10-05 DIAGNOSIS — I5032 Chronic diastolic (congestive) heart failure: Secondary | ICD-10-CM | POA: Diagnosis not present

## 2021-10-05 DIAGNOSIS — R0600 Dyspnea, unspecified: Secondary | ICD-10-CM | POA: Diagnosis not present

## 2021-10-05 NOTE — Telephone Encounter (Signed)
Pt called to report that she has been having tightness and soreness in her chest for a week... she says that it is conmstanmt anfd it hurts with movement... she has taken a nitro with no improvement... she says that both her legs up to her knees have been swollen and her right foot is painful and her big toe hurts... there a "red spot' no heat. They are both sore to walk on... elevation helps some but when she gets up the swell back up... she denies dizziness, no SOB....Pt says the chest tightness is at rest and with exertion...  She has not had any missed doses of her meds... I will forward to Dr. [name] and her nurse but if the pts symptoms worsen prior to hearing back from [place] she will call EMS.

## 2021-10-05 NOTE — Telephone Encounter (Signed)
Pt c/o swelling: STAT is pt has developed SOB within 24 hours  How much weight have you gained and in what time span? no  If swelling, where is the swelling located? In legs (2 weeks)  Are you currently taking a fluid pill? Yes (4 day)  Are you currently SOB? yes  Do you have a log of your daily weights (if so, list)? no  Have you gained 3 pounds in a day or 5 pounds in a week? no  Have you traveled recently? No   Pt c/o of Chest Pain: STAT if CP now or developed within 24 hours  1. Are you having CP right now? yes  2. Are you experiencing any other symptoms (ex. SOB, nausea, vomiting, sweating)? sob  3. How long have you been experiencing CP? 2 weeks  4. Is your CP continuous or coming and going? continuous  5. Have you taken Nitroglycerin? yes ?

## 2021-10-05 NOTE — Progress Notes (Signed)
Subjective:    Patient ID: [name], female    DOB: [birth year], 87 y.o.   MRN: [id number] Patient presents today with shortness of breath, dyspnea on exertion, and bilateral leg swelling.  She has +2 edema in both legs to the knees.  Her weight is up considerably from her last visit: Wt Readings from Last 3 Encounters:  10/05/21 179 lb (81.2 kg)  09/15/21 172 lb (78 kg)  08/26/21 163 lb 9.6 oz (74.2 kg)   She has gained 7 pounds in less than 3 weeks.  She also reports a tightness in the center of her chest.  On echocardiogram in August of last year that showed severe pulmonary hypertension but was otherwise unchanged: IMPRESSIONS     1. Left ventricular ejection fraction, by estimation, is 65 to 70%. The  left ventricle has normal function. The left ventricle has no regional  wall motion abnormalities. There is mild concentric left ventricular  hypertrophy. Left ventricular diastolic  function could not be evaluated. Elevated left ventricular end-diastolic  pressure.   2. Right ventricular systolic function is normal. The right ventricular  size is normal. There is severely elevated pulmonary artery systolic  pressure. The estimated right ventricular systolic pressure is 57.0 mmHg.   3. Left atrial size was moderately dilated.   4. Right atrial size was moderately dilated.   5. The mitral valve is grossly normal. Trivial mitral valve  regurgitation. No evidence of mitral stenosis. Severe mitral annular  calcification.   6. Tricuspid valve regurgitation is moderate.   7. The aortic valve is tricuspid. There is moderate calcification of the  aortic valve. There is mild thickening of the aortic valve. Aortic valve  regurgitation is not visualized. Mild aortic valve stenosis.   8. Aortic dilatation noted. There is borderline dilatation of the  ascending aorta, measuring 38 mm.   9. The inferior vena cava is dilated in size with >50% respiratory  variability, suggesting right atrial  pressure of 8 mmHg.   Patient had a left heart catheterization in April 2022 that showed nonobstructive coronary artery disease: Mid LM lesion is 30% stenosed. Ost RCA lesion is 50% stenosed. Prox RCA lesion is 30% stenosed. RPAV lesion is 30% stenosed.   1. Normal RA pressure with elevated PCWP and prominent V-waves.  2. Mild pulmonary venous hypertension.  3. Nonobstructive coronary disease.    Elevated PCWP with prominent V-waves suggestive of significant mitral regurgitation versus significant LV diastolic dysfunction.  I reviewed prior echo, no significant MR.  I suspect this is representative of significant LV diastolic dysfunction.  I am concerned for possibility of cardiac amyloidosis, think patient needs workup for this.  EKG today shows atrial fibrillation with no evidence of ischemia or infarction.  There is T wave inversion in lead aVL but this is chronic with previously seen on the EKG from April 2022. Past Medical History:  Diagnosis Date   Allergy    rhinitis   Aortic stenosis    mild by echo 06/2017   Arthritis    Bradycardia    a. 10/2017 -> beta blocker cut back due to HR 39.   Breast cancer ([place]) 01/06/2012   Cancer ([place])    right colon and left breast   Chronic diastolic CHF (congestive heart failure) (HCC)    Colon cancer ([place]) 01/06/2012   Colovesical fistula    Dr. Marlou Starks and [name] planning surgery 08/2018- surgery revealed spontaneous closure   COPD (chronic obstructive pulmonary disease) ([place])  pt. denies   Coronary artery disease 2006   a.  NSTEMI in 2016, cath showed 15% prox-mid RCA, 20% prox LAD, EF 25-35% by cath and 35-40% -> felt due to Takotsubo cardiomyopathy.   Dilated aortic root ([place])    87mmHg by echo 06/2017   Diverticulosis    Dyspnea    Edema extremities    GERD (gastroesophageal reflux disease)    Hernia    Hiatal hernia    denies   Hyperlipidemia    Hypertension    Mild aortic stenosis    echo 11/2015 but not noted on echo  06/2016   Osteopenia    Permanent atrial fibrillation (HCC)    chronic atrial fibrillation   Pneumonia    hx child   Pulmonary HTN ([place])    a. moderate to severe PASP 10mmHg echo 11/2015 - now 72mmHg by echo 06/2017. CTA chest in 11/16 with no PE. PFTs in 7/15 with mild obstructive lung disease. She had a negative sleep study in 2017. b. Felt due to left sided HF.   Stroke [facility])    Takotsubo syndrome 07/29/2015   a. EF 35-40% by echo; akinesis of mid-apical anteroseptal and apical myocardium.  EF now normalized on echo 11/2015   Past Surgical History:  Procedure Laterality Date   APPENDECTOMY     BREAST SURGERY     lumpectomy left   CARDIAC CATHETERIZATION     CARDIAC CATHETERIZATION N/A 07/28/2015   Procedure: Left Heart Cath and Coronary Angiography;  Surgeon: Peter M Martinique, MD;  Location: [place] [facility];  Service: Cardiovascular;  Laterality: N/A;   CHOLECYSTECTOMY     COLECTOMY     right side   CYSTOSCOPY WITH STENT PLACEMENT Left 09/04/2018   Procedure: CYSTOSCOPY WITH LEFT STENT PLACEMENT, BLADDER REPAIR, CYSTOSCOPY WITH LEFT STENT REMOVAL;  Surgeon: Raynelle Bring, MD;  Location: [facility];  Service: Urology;  Laterality: Left;   EXCISION OF ACCESSORY NIPPLE Bilateral 05/30/2013   Procedure: BILATERAL NIPPLE BIOPSY;  Surgeon: Merrie Roof, MD;  Location: [place];  Service: General;  Laterality: Bilateral;   EYE SURGERY Bilateral 12   cataracts   HYSTEROSCOPY WITH D & C N/A 05/29/2020   Procedure: DILATATION AND CURETTAGE /HYSTEROSCOPY;  Surgeon: Homero Fellers, MD;  Location: [facility];  Service: Gynecology;  Laterality: N/A;   IR RADIOLOGIST EVAL & MGMT  07/26/2017   LAPAROSCOPIC RIGHT COLECTOMY N/A 09/04/2018   Procedure: LAPAROSCOPIC ASSISTED SIGMOID COLECTOMY WITH REPAIR OF FISTULA TO BLADDER;  Surgeon: Jovita Kussmaul, MD;  Location: [facility];  Service: General;  Laterality: N/A;   RIGHT/LEFT HEART CATH AND CORONARY ANGIOGRAPHY N/A 12/10/2020   Procedure: RIGHT/LEFT  HEART CATH AND CORONARY ANGIOGRAPHY;  Surgeon: Larey Dresser, MD;  Location: [place] [facility];  Service: Cardiovascular;  Laterality: N/A;   SPLIT NIGHT STUDY  02/02/2016       Current Outpatient Medications on File Prior to Visit  Medication Sig Dispense Refill   acetaminophen (TYLENOL) 500 MG tablet Take 500-1,000 mg by mouth every 6 (six) hours as needed for headache (pain).     amLODipine (NORVASC) 5 MG tablet Take 2 tablets (10 mg total) by mouth daily. 135 tablet 3   apixaban (ELIQUIS) 5 MG TABS tablet Take 1 tablet (5 mg total) by mouth 2 (two) times daily. 180 tablet 1   Ascorbic Acid (VITAMIN C) 1000 MG tablet Take 1,000 mg by mouth once a week.     Azelastine HCl 137 MCG/SPRAY SOLN Place  1 spray into both nostrils 2 (two) times daily.     Cholecalciferol (VITAMIN D3) 25 MCG (1000 UT) CAPS Take 1,000 Units by mouth once a week.     clobetasol ointment (TEMOVATE) 0.05 % clobetasol 0.05 % topical ointment  APPLY TO THE AFFECTED AREAS TWICE DAILY WHILE FLARED FOR TWO WEEKS. THEN USE ONCE OR TWICE WEEKLY AS MAINTENANCE     clonazePAM (KLONOPIN) 0.5 MG tablet Take 1 tablet by mouth twice daily as needed for anxiety 60 tablet 0   cloNIDine (CATAPRES) 0.1 MG tablet Take 1 tablet by mouth twice daily 180 tablet 1   clotrimazole-betamethasone (LOTRISONE) cream Apply 1 application topically as needed.     famotidine (PEPCID) 20 MG tablet Take 20 mg by mouth at bedtime. As needed     ferrous sulfate 325 (65 FE) MG tablet Take 1 tablet (325 mg total) by mouth daily with breakfast. 90 tablet 3   fluticasone (FLONASE) 50 MCG/ACT nasal spray Place 1 spray into both nostrils as needed for allergies or rhinitis.     HYDROcodone-acetaminophen (NORCO) 5-325 MG tablet Take 1 tablet by mouth every 6 (six) hours as needed for moderate pain. 30 tablet 0   losartan (COZAAR) 100 MG tablet Take 1 tablet by mouth once daily 90 tablet 1   nitroGLYCERIN (NITROSTAT) 0.4 MG SL tablet DISSOLVE ONE TABLET UNDER  THE TONGUE EVERY 5 MINUTES AS NEEDED FOR CHEST PAIN.  DO NOT EXCEED A TOTAL OF 3 DOSES IN 15 MINUTES 25 tablet 0   spironolactone (ALDACTONE) 25 MG tablet Take 25 mg by mouth daily.     torsemide (DEMADEX) 20 MG tablet Take 4 tablets (80 mg total) by mouth daily. 120 tablet 11   No current facility-administered medications on file prior to visit.   Allergies  Allergen Reactions   Contrast Media [Iodinated Contrast Media] Hives and Other (See Comments)    Per pt strong burning sensation starting in chest radiating outward    Clonidine Derivatives Other (See Comments)    Throat dry   Statins Other (See Comments)    "bones hurt"   Sulfa Antibiotics Diarrhea    Tremors    Celebrex [Celecoxib] Rash   Isosorbide Nitrate Itching and Rash   Other Itching and Rash    Plastic and paper tape and heart monitor pads   Tape Itching and Rash    Red Where applied and will spread   Social History   Socioeconomic History   Marital status: Married    Spouse name: Not on file   Number of children: Not on file   Years of education: Not on file   Highest education level: Not on file  Occupational History   Not on file  Tobacco Use   Smoking status: Never   Smokeless tobacco: Never  Vaping Use   Vaping Use: Never used  Substance and Sexual Activity   Alcohol use: No   Drug use: No   Sexual activity: Not Currently  Other Topics Concern   Not on file  Social History Narrative   Not on file   Social Determinants of Health   Financial Resource Strain: Not on file  Food Insecurity: Not on file  Transportation Needs: Not on file  Physical Activity: Not on file  Stress: Not on file  Social Connections: Not on file  Intimate Partner Violence: Not on file      Review of Systems  Musculoskeletal:  Positive for back pain.  All other systems reviewed and are negative.  Objective:   Physical Exam Vitals reviewed.  Constitutional:      General: She is not in acute distress.     Appearance: Normal appearance. She is well-developed and normal weight. She is not diaphoretic.  HENT:     Right Ear: Tympanic membrane and ear canal normal.     Left Ear: Tympanic membrane and ear canal normal.     Nose: Nose normal. No congestion or rhinorrhea.     Mouth/Throat:     Mouth: Mucous membranes are moist.     Pharynx: No oropharyngeal exudate or posterior oropharyngeal erythema.  Eyes:     General:        Right eye: No discharge.        Left eye: No discharge.     Conjunctiva/sclera: Conjunctivae normal.  Neck:     Vascular: No carotid bruit.  Cardiovascular:     Rate and Rhythm: Normal rate and regular rhythm.     Heart sounds: Murmur heard.    No friction rub. No gallop.  Pulmonary:     Effort: Pulmonary effort is normal. No accessory muscle usage, prolonged expiration or respiratory distress.     Breath sounds: Normal breath sounds and air entry. No stridor, decreased air movement or transmitted upper airway sounds. No decreased breath sounds, wheezing, rhonchi or rales.  Chest:     Chest wall: Tenderness present. No deformity or swelling.    Abdominal:     General: Bowel sounds are normal. There is no distension.     Palpations: Abdomen is not rigid.     Tenderness: There is no abdominal tenderness. There is no guarding or rebound.     Hernia: No hernia is present.  Musculoskeletal:     Cervical back: No rigidity or tenderness.     Right knee: Decreased range of motion. Tenderness present over the medial joint line and lateral joint line.     Right lower leg: Tenderness present.     Left lower leg: Tenderness present.  Skin:    Findings: No bruising or lesion.  Neurological:     Mental Status: She is alert. Mental status is at baseline.     Motor: No abnormal muscle tone.     Coordination: Coordination normal.     Gait: Gait normal.     Deep Tendon Reflexes: Reflexes normal.  Psychiatric:        Mood and Affect: Mood normal.        Behavior: Behavior  normal.        Thought Content: Thought content normal.        Judgment: Judgment normal.          Assessment & Plan:  Dyspnea, unspecified type - Plan: EKG 12-Lead, CBC with Differential/Platelet, COMPLETE METABOLIC PANEL WITH GFR, Brain natriuretic peptide  Chronic diastolic CHF (congestive heart failure) (HCC)  Pulmonary hypertension (HCC) EKG shows no evidence of ischemia.  I believe the patient is fluid overloaded due to diastolic heart failure coupled with pulmonary hypertension.  Increase torsemide from 80 mg in the morning to 80 mg in the morning coupled with 40 mg in the afternoon and reassess the patient on Thursday.  I would like to see the patient diuresed 5 to 6 pounds over the next 3 to 4 days.  I believe that her shortness of breath will improve with this.  Recommend going to the hospital if worsening.  Check CMP to monitor electrolytes including potassium and renal function.

## 2021-10-05 NOTE — Telephone Encounter (Signed)
Spoke with the patient and gave her recommendations from Dr. [name]. Patient verbalized understanding.

## 2021-10-06 LAB — CBC WITH DIFFERENTIAL/PLATELET
Absolute Monocytes: 668 cells/uL (ref 200–950)
Basophils Absolute: 10 cells/uL (ref 0–200)
Basophils Relative: 0.2 %
Eosinophils Absolute: 31 cells/uL (ref 15–500)
Eosinophils Relative: 0.6 %
HCT: 29.1 % — ABNORMAL LOW (ref 35.0–45.0)
Hemoglobin: 9.4 g/dL — ABNORMAL LOW (ref 11.7–15.5)
Lymphs Abs: 1076 cells/uL (ref 850–3900)
MCH: 30.9 pg (ref 27.0–33.0)
MCHC: 32.3 g/dL (ref 32.0–36.0)
MCV: 95.7 fL (ref 80.0–100.0)
MPV: 9.7 fL (ref 7.5–12.5)
Monocytes Relative: 13.1 %
Neutro Abs: 3315 cells/uL (ref 1500–7800)
Neutrophils Relative %: 65 %
Platelets: 226 10*3/uL (ref 140–400)
RBC: 3.04 10*6/uL — ABNORMAL LOW (ref 3.80–5.10)
RDW: 12.9 % (ref 11.0–15.0)
Total Lymphocyte: 21.1 %
WBC: 5.1 10*3/uL (ref 3.8–10.8)

## 2021-10-06 LAB — COMPLETE METABOLIC PANEL WITH GFR
AG Ratio: 1.4 (calc) (ref 1.0–2.5)
ALT: 19 U/L (ref 6–29)
AST: 24 U/L (ref 10–35)
Albumin: 3.9 g/dL (ref 3.6–5.1)
Alkaline phosphatase (APISO): 67 U/L (ref 37–153)
BUN/Creatinine Ratio: 32 (calc) — ABNORMAL HIGH (ref 6–22)
BUN: 43 mg/dL — ABNORMAL HIGH (ref 7–25)
CO2: 31 mmol/L (ref 20–32)
Calcium: 9.5 mg/dL (ref 8.6–10.4)
Chloride: 97 mmol/L — ABNORMAL LOW (ref 98–110)
Creat: 1.35 mg/dL — ABNORMAL HIGH (ref 0.60–0.95)
Globulin: 2.7 g/dL (calc) (ref 1.9–3.7)
Glucose, Bld: 125 mg/dL — ABNORMAL HIGH (ref 65–99)
Potassium: 4.4 mmol/L (ref 3.5–5.3)
Sodium: 133 mmol/L — ABNORMAL LOW (ref 135–146)
Total Bilirubin: 0.6 mg/dL (ref 0.2–1.2)
Total Protein: 6.6 g/dL (ref 6.1–8.1)
eGFR: 39 mL/min/{1.73_m2} — ABNORMAL LOW (ref >=60)

## 2021-10-06 LAB — BRAIN NATRIURETIC PEPTIDE: Brain Natriuretic Peptide: 64 pg/mL (ref <100)

## 2021-10-07 ENCOUNTER — Other Ambulatory Visit: Payer: Self-pay | Admitting: Cardiology

## 2021-10-08 ENCOUNTER — Encounter: Payer: Self-pay | Admitting: Family Medicine

## 2021-10-08 ENCOUNTER — Ambulatory Visit (INDEPENDENT_AMBULATORY_CARE_PROVIDER_SITE_OTHER): Payer: Medicare Other | Admitting: Family Medicine

## 2021-10-08 ENCOUNTER — Other Ambulatory Visit: Payer: Self-pay

## 2021-10-08 VITALS — BP 112/48 | HR 65 | Temp 97.7°F | Resp 18 | Ht 63.0 in | Wt 173.0 lb

## 2021-10-08 DIAGNOSIS — R0600 Dyspnea, unspecified: Secondary | ICD-10-CM | POA: Diagnosis not present

## 2021-10-08 DIAGNOSIS — I5032 Chronic diastolic (congestive) heart failure: Secondary | ICD-10-CM | POA: Diagnosis not present

## 2021-10-08 DIAGNOSIS — I272 Pulmonary hypertension, unspecified: Secondary | ICD-10-CM | POA: Diagnosis not present

## 2021-10-08 NOTE — Progress Notes (Signed)
I am well  Subjective:    Patient ID: [name], female    DOB: 1934/11/09, 87 y.o.   MRN: [id number]  10/05/21 Patient presents today with shortness of breath, dyspnea on exertion, and bilateral leg swelling.  She has +2 edema in both legs to the knees.  Her weight is up considerably from her last visit: Wt Readings from Last 3 Encounters:  10/05/21 179 lb (81.2 kg)  09/15/21 172 lb (78 kg)  08/26/21 163 lb 9.6 oz (74.2 kg)   She has gained 7 pounds in less than 3 weeks.  She also reports a tightness in the center of her chest.  On echocardiogram in August of last year that showed severe pulmonary hypertension but was otherwise unchanged: IMPRESSIONS     1. Left ventricular ejection fraction, by estimation, is 65 to 70%. The  left ventricle has normal function. The left ventricle has no regional  wall motion abnormalities. There is mild concentric left ventricular  hypertrophy. Left ventricular diastolic  function could not be evaluated. Elevated left ventricular end-diastolic  pressure.   2. Right ventricular systolic function is normal. The right ventricular  size is normal. There is severely elevated pulmonary artery systolic  pressure. The estimated right ventricular systolic pressure is 53.6 mmHg.   3. Left atrial size was moderately dilated.   4. Right atrial size was moderately dilated.   5. The mitral valve is grossly normal. Trivial mitral valve  regurgitation. No evidence of mitral stenosis. Severe mitral annular  calcification.   6. Tricuspid valve regurgitation is moderate.   7. The aortic valve is tricuspid. There is moderate calcification of the  aortic valve. There is mild thickening of the aortic valve. Aortic valve  regurgitation is not visualized. Mild aortic valve stenosis.   8. Aortic dilatation noted. There is borderline dilatation of the  ascending aorta, measuring 38 mm.   9. The inferior vena cava is dilated in size with >50% respiratory  variability,  suggesting right atrial pressure of 8 mmHg.   Patient had a left heart catheterization in April 2022 that showed nonobstructive coronary artery disease: Mid LM lesion is 30% stenosed. Ost RCA lesion is 50% stenosed. Prox RCA lesion is 30% stenosed. RPAV lesion is 30% stenosed.   1. Normal RA pressure with elevated PCWP and prominent V-waves.  2. Mild pulmonary venous hypertension.  3. Nonobstructive coronary disease.    Elevated PCWP with prominent V-waves suggestive of significant mitral regurgitation versus significant LV diastolic dysfunction.  I reviewed prior echo, no significant MR.  I suspect this is representative of significant LV diastolic dysfunction.  I am concerned for possibility of cardiac amyloidosis, think patient needs workup for this.  EKG today shows atrial fibrillation with no evidence of ischemia or infarction.  There is T wave inversion in lead aVL but this is chronic with previously seen on the EKG from April 2022.  At that time, my plan was: EKG shows no evidence of ischemia.  I believe the patient is fluid overloaded due to diastolic heart failure coupled with pulmonary hypertension.  Increase torsemide from 80 mg in the morning to 80 mg in the morning coupled with 40 mg in the afternoon and reassess the patient on Thursday.  I would like to see the patient diuresed 5 to 6 pounds over the next 3 to 4 days.  I believe that her shortness of breath will improve with this.  Recommend going to the hospital if worsening.  Check CMP to monitor  electrolytes including potassium and renal function.  10/08/21 BNP was 64 but Hgb was low at 9.4.  Baseline is usually 10.4.  Patient has diuresed 6 pounds.  She is back to her dry weight.  She is having occasional PVCs.  Today on examination, the patient has an irregularly irregular heartbeat consistent with her atrial fibrillation however occasionally she does have what appears to be PVCs that I can auscultate.  She states that she can  feel her heart jump.  She denies any syncope or near syncope or chest pain.  Her breathing is back to her baseline.  She does report constipation today. Past Medical History:  Diagnosis Date   Allergy    rhinitis   Aortic stenosis    mild by echo 06/2017   Arthritis    Bradycardia    a. 10/2017 -> beta blocker cut back due to HR 39.   Breast cancer ([place]) 01/06/2012   Cancer ([facility])    right colon and left breast   Chronic diastolic CHF (congestive heart failure) ([place])    Colon cancer ([place]) 01/06/2012   Colovesical fistula    Dr. Marlou Starks and [name] planning surgery 08/2018- surgery revealed spontaneous closure   COPD (chronic obstructive pulmonary disease) ([place])    pt. denies   Coronary artery disease 2006   a.  NSTEMI in 2016, cath showed 15% prox-mid RCA, 20% prox LAD, EF 25-35% by cath and 35-40% -> felt due to Takotsubo cardiomyopathy.   Dilated aortic root ([place])    2mmHg by echo 06/2017   Diverticulosis    Dyspnea    Edema extremities    GERD (gastroesophageal reflux disease)    Hernia    Hiatal hernia    denies   Hyperlipidemia    Hypertension    Mild aortic stenosis    echo 11/2015 but not noted on echo 06/2016   Osteopenia    Permanent atrial fibrillation (H[facility])    chronic atrial fibrillation   Pneumonia    hx child   Pulmonary HTN ([place])    a. moderate to severe PASP 14mmHg echo 11/2015 - now 75mmHg by echo 06/2017. CTA chest in 11/16 with no PE. PFTs in 7/15 with mild obstructive lung disease. She had a negative sleep study in 2017. b. Felt due to left sided HF.   Stroke [facility])    Takotsubo syndrome 07/29/2015   a. EF 35-40% by echo; akinesis of mid-apical anteroseptal and apical myocardium.  EF now normalized on echo 11/2015   Past Surgical History:  Procedure Laterality Date   APPENDECTOMY     BREAST SURGERY     lumpectomy left   CARDIAC CATHETERIZATION     CARDIAC CATHETERIZATION N/A 07/28/2015   Procedure: Left Heart Cath and Coronary Angiography;  Surgeon: Peter  M Martinique, MD;  Location: [place] [facility];  Service: Cardiovascular;  Laterality: N/A;   CHOLECYSTECTOMY     COLECTOMY     right side   CYSTOSCOPY WITH STENT PLACEMENT Left 09/04/2018   Procedure: CYSTOSCOPY WITH LEFT STENT PLACEMENT, BLADDER REPAIR, CYSTOSCOPY WITH LEFT STENT REMOVAL;  Surgeon: Raynelle Bring, MD;  Location: [facility];  Service: Urology;  Laterality: Left;   EXCISION OF ACCESSORY NIPPLE Bilateral 05/30/2013   Procedure: BILATERAL NIPPLE BIOPSY;  Surgeon: Merrie Roof, MD;  Location: [place];  Service: General;  Laterality: Bilateral;   EYE SURGERY Bilateral 12   cataracts   HYSTEROSCOPY WITH D & C N/A 05/29/2020   Procedure: DILATATION AND CURETTAGE /HYSTEROSCOPY;  Surgeon: Homero Fellers, MD;  Location: [facility];  Service: Gynecology;  Laterality: N/A;   IR RADIOLOGIST EVAL & MGMT  07/26/2017   LAPAROSCOPIC RIGHT COLECTOMY N/A 09/04/2018   Procedure: LAPAROSCOPIC ASSISTED SIGMOID COLECTOMY WITH REPAIR OF FISTULA TO BLADDER;  Surgeon: Jovita Kussmaul, MD;  Location: [facility];  Service: General;  Laterality: N/A;   RIGHT/LEFT HEART CATH AND CORONARY ANGIOGRAPHY N/A 12/10/2020   Procedure: RIGHT/LEFT HEART CATH AND CORONARY ANGIOGRAPHY;  Surgeon: Larey Dresser, MD;  Location: [place] [facility];  Service: Cardiovascular;  Laterality: N/A;   SPLIT NIGHT STUDY  02/02/2016       Current Outpatient Medications on File Prior to Visit  Medication Sig Dispense Refill   acetaminophen (TYLENOL) 500 MG tablet Take 500-1,000 mg by mouth every 6 (six) hours as needed for headache (pain).     amLODipine (NORVASC) 5 MG tablet Take 2 tablets (10 mg total) by mouth daily. 135 tablet 3   apixaban (ELIQUIS) 5 MG TABS tablet Take 1 tablet (5 mg total) by mouth 2 (two) times daily. 180 tablet 1   Ascorbic Acid (VITAMIN C) 1000 MG tablet Take 1,000 mg by mouth once a week.     Azelastine HCl 137 MCG/SPRAY SOLN Place 1 spray into both nostrils 2 (two) times daily.     Cholecalciferol (VITAMIN  D3) 25 MCG (1000 UT) CAPS Take 1,000 Units by mouth once a week.     clobetasol ointment (TEMOVATE) 0.05 % clobetasol 0.05 % topical ointment  APPLY TO THE AFFECTED AREAS TWICE DAILY WHILE FLARED FOR TWO WEEKS. THEN USE ONCE OR TWICE WEEKLY AS MAINTENANCE     clonazePAM (KLONOPIN) 0.5 MG tablet Take 1 tablet by mouth twice daily as needed for anxiety 60 tablet 0   cloNIDine (CATAPRES) 0.1 MG tablet Take 1 tablet by mouth twice daily 180 tablet 1   clotrimazole-betamethasone (LOTRISONE) cream Apply 1 application topically as needed.     famotidine (PEPCID) 20 MG tablet Take 20 mg by mouth at bedtime. As needed     ferrous sulfate 325 (65 FE) MG tablet Take 1 tablet (325 mg total) by mouth daily with breakfast. 90 tablet 3   fluticasone (FLONASE) 50 MCG/ACT nasal spray Place 1 spray into both nostrils as needed for allergies or rhinitis.     HYDROcodone-acetaminophen (NORCO) 5-325 MG tablet Take 1 tablet by mouth every 6 (six) hours as needed for moderate pain. 30 tablet 0   losartan (COZAAR) 100 MG tablet Take 1 tablet by mouth once daily 90 tablet 1   nitroGLYCERIN (NITROSTAT) 0.4 MG SL tablet DISSOLVE ONE TABLET UNDER THE TONGUE EVERY 5 MINUTES AS NEEDED FOR CHEST PAIN.  DO NOT EXCEED A TOTAL OF 3 DOSES IN 15 MINUTES 25 tablet 0   spironolactone (ALDACTONE) 25 MG tablet Take 25 mg by mouth daily.     torsemide (DEMADEX) 20 MG tablet Take 4 tablets (80 mg total) by mouth daily. 120 tablet 11   No current facility-administered medications on file prior to visit.   Allergies  Allergen Reactions   Contrast Media [Iodinated Contrast Media] Hives and Other (See Comments)    Per pt strong burning sensation starting in chest radiating outward    Clonidine Derivatives Other (See Comments)    Throat dry   Statins Other (See Comments)    "bones hurt"   Sulfa Antibiotics Diarrhea    Tremors    Celebrex [Celecoxib] Rash   Isosorbide Nitrate Itching and Rash   Other Itching  and Rash    Plastic and  paper tape and heart monitor pads   Tape Itching and Rash    Red Where applied and will spread   Social History   Socioeconomic History   Marital status: Married    Spouse name: Not on file   Number of children: Not on file   Years of education: Not on file   Highest education level: Not on file  Occupational History   Not on file  Tobacco Use   Smoking status: Never   Smokeless tobacco: Never  Vaping Use   Vaping Use: Never used  Substance and Sexual Activity   Alcohol use: No   Drug use: No   Sexual activity: Not Currently  Other Topics Concern   Not on file  Social History Narrative   Not on file   Social Determinants of Health   Financial Resource Strain: Not on file  Food Insecurity: Not on file  Transportation Needs: Not on file  Physical Activity: Not on file  Stress: Not on file  Social Connections: Not on file  Intimate Partner Violence: Not on file      Review of Systems  Musculoskeletal:  Positive for back pain.  All other systems reviewed and are negative.     Objective:   Physical Exam Vitals reviewed.  Constitutional:      General: She is not in acute distress.    Appearance: Normal appearance. She is well-developed and normal weight. She is not diaphoretic.  HENT:     Right Ear: Tympanic membrane and ear canal normal.     Left Ear: Tympanic membrane and ear canal normal.     Nose: Nose normal. No congestion or rhinorrhea.     Mouth/Throat:     Mouth: Mucous membranes are moist.     Pharynx: No oropharyngeal exudate or posterior oropharyngeal erythema.  Eyes:     General:        Right eye: No discharge.        Left eye: No discharge.     Conjunctiva/sclera: Conjunctivae normal.  Neck:     Vascular: No carotid bruit.  Cardiovascular:     Rate and Rhythm: Normal rate and regular rhythm.     Heart sounds: Murmur heard.    No friction rub. No gallop.  Pulmonary:     Effort: Pulmonary effort is normal. No accessory muscle usage,  prolonged expiration or respiratory distress.     Breath sounds: Normal breath sounds and air entry. No stridor, decreased air movement or transmitted upper airway sounds. No decreased breath sounds, wheezing, rhonchi or rales.  Chest:     Chest wall: No deformity or swelling.  Abdominal:     General: Bowel sounds are normal. There is no distension.     Palpations: Abdomen is not rigid.     Tenderness: There is no abdominal tenderness. There is no guarding or rebound.     Hernia: No hernia is present.  Musculoskeletal:     Cervical back: No rigidity or tenderness.  Skin:    Findings: No bruising or lesion.  Neurological:     Mental Status: She is alert. Mental status is at baseline.     Motor: No abnormal muscle tone.     Coordination: Coordination normal.     Gait: Gait normal.     Deep Tendon Reflexes: Reflexes normal.  Psychiatric:        Mood and Affect: Mood normal.        Behavior:  Behavior normal.        Thought Content: Thought content normal.        Judgment: Judgment normal.          Assessment & Plan:  Dyspnea, unspecified type - Plan: BASIC METABOLIC PANEL WITH GFR, CBC with Differential/Platelet  Chronic diastolic CHF (congestive heart failure) ([place]) - Plan: BASIC METABOLIC PANEL WITH GFR, CBC with Differential/Platelet  Pulmonary hypertension ([place]) Patient is euvolemic.  Recommended reducing her fluid pill back to his original dose of 80 mg a day.  Check a BMP to monitor potassium to ensure I have not inadvertently triggered hypokalemia especially given occasional PVCs.  Check CBC.  Baseline hemoglobin is 10.  There [place] be a dilutional component causing the 9.4.  After diuresis, I anticipate hemoglobin to be near 10.  If dropping below 9, I will work the patient up for occult blood loss.

## 2021-10-09 DIAGNOSIS — M1711 Unilateral primary osteoarthritis, right knee: Secondary | ICD-10-CM | POA: Diagnosis not present

## 2021-10-09 LAB — BASIC METABOLIC PANEL WITH GFR
BUN/Creatinine Ratio: 26 (calc) — ABNORMAL HIGH (ref 6–22)
BUN: 36 mg/dL — ABNORMAL HIGH (ref 7–25)
CO2: 30 mmol/L (ref 20–32)
Calcium: 9.8 mg/dL (ref 8.6–10.4)
Chloride: 95 mmol/L — ABNORMAL LOW (ref 98–110)
Creat: 1.37 mg/dL — ABNORMAL HIGH (ref 0.60–0.95)
Glucose, Bld: 105 mg/dL — ABNORMAL HIGH (ref 65–99)
Potassium: 4.5 mmol/L (ref 3.5–5.3)
Sodium: 134 mmol/L — ABNORMAL LOW (ref 135–146)
eGFR: 38 mL/min/{1.73_m2} — ABNORMAL LOW (ref >=60)

## 2021-10-09 LAB — CBC WITH DIFFERENTIAL/PLATELET
Absolute Monocytes: 825 cells/uL (ref 200–950)
Basophils Absolute: 20 cells/uL (ref 0–200)
Basophils Relative: 0.4 %
Eosinophils Absolute: 100 cells/uL (ref 15–500)
Eosinophils Relative: 2 %
HCT: 29.5 % — ABNORMAL LOW (ref 35.0–45.0)
Hemoglobin: 9.7 g/dL — ABNORMAL LOW (ref 11.7–15.5)
Lymphs Abs: 1490 cells/uL (ref 850–3900)
MCH: 31.1 pg (ref 27.0–33.0)
MCHC: 32.9 g/dL (ref 32.0–36.0)
MCV: 94.6 fL (ref 80.0–100.0)
MPV: 9.4 fL (ref 7.5–12.5)
Monocytes Relative: 16.5 %
Neutro Abs: 2565 cells/uL (ref 1500–7800)
Neutrophils Relative %: 51.3 %
Platelets: 299 10*3/uL (ref 140–400)
RBC: 3.12 10*6/uL — ABNORMAL LOW (ref 3.80–5.10)
RDW: 13.1 % (ref 11.0–15.0)
Total Lymphocyte: 29.8 %
WBC: 5 10*3/uL (ref 3.8–10.8)

## 2021-10-15 ENCOUNTER — Other Ambulatory Visit: Payer: Self-pay | Admitting: Cardiology

## 2021-10-16 DIAGNOSIS — M1711 Unilateral primary osteoarthritis, right knee: Secondary | ICD-10-CM | POA: Diagnosis not present

## 2021-10-16 NOTE — Telephone Encounter (Signed)
Eliquis 5 mg refill request received. Patient is 87 years old, weight- 78.5 kg, Crea- 1.37 on 10/08/21, Diagnosis-afib, and last seen by Allena Katz, NP on 08/26/21. Dose is appropriate based on dosing criteria. Will send in refill to requested pharmacy.

## 2021-10-23 DIAGNOSIS — M1711 Unilateral primary osteoarthritis, right knee: Secondary | ICD-10-CM | POA: Diagnosis not present

## 2021-11-05 ENCOUNTER — Other Ambulatory Visit: Payer: Self-pay | Admitting: Cardiology

## 2021-11-06 ENCOUNTER — Telehealth: Payer: Self-pay | Admitting: Cardiology

## 2021-11-06 MED ORDER — CLONIDINE HCL 0.1 MG PO TABS: 0.1000 mg | ORAL_TABLET | Freq: Two times a day (BID) | ORAL | 2 refills | Status: DC

## 2021-11-06 MED ORDER — NITROGLYCERIN 0.4 MG SL SUBL: SUBLINGUAL_TABLET | SUBLINGUAL | 8 refills | Status: DC

## 2021-11-06 NOTE — Telephone Encounter (Signed)
Pt's medications were sent to pt's pharmacy as requested. Confirmation received.  

## 2021-11-06 NOTE — Telephone Encounter (Signed)
?*  STAT* If patient is at the pharmacy, call can be transferred to refill team. ? ? ?1. Which medications need to be refilled? (please list name of each medication and dose if known)  ?nitroGLYCERIN (NITROSTAT) 0.4 MG SL tablet ?cloNIDine (CATAPRES) 0.1 MG tablet ? ?2. Which pharmacy/location (including street and city if local pharmacy) is medication to be sent to? ?[place], [place] ? ?3. Do they need a 30 day or 90 day supply? 30 with refills  ? ? ?Patient is out of medication  ?

## 2021-11-19 ENCOUNTER — Telehealth: Payer: Self-pay | Admitting: Pharmacist

## 2021-11-19 NOTE — Progress Notes (Signed)
? ? ?Chronic Care Management ?Pharmacy Assistant  ? ? ?Name: [name]  MRN: [id number] DOB: 08-28-1935 ? ? ? ?Reason for Encounter: Disease State - General Adherence Call  ? ?  ? ?Recent office visits:  ?10/08/21 Jenna Luo, MD - Family Medicine - Dyspnea - Labs were ordered. Reduced fluid pill back to his original dose of 80 mg a day. Follow up as scheduled.  ? ?10/05/21 Jenna Luo, MD - Dyspnea - Labs were ordered. EKG performed. Increase torsemide from 80 mg in the morning to 80 mg in the morning coupled with 40 mg in the afternoon and reassess the patient on Thursday.  ? ?09/15/21 Jenna Luo, MD - Family Medicine - Chest wall pain - predniSONE (DELTASONE) 20 MG tablet prescribed. Norco 5/325 1 p.o. every 6 hours for severe pain prescribed. Follow up if no improvement.  ? ?08/11/21 Jenna Luo, MD - [place] were ordered. clobetasol ointment (TEMOVATE) 0.05 % prescribed. Follow up as scheduled.  ? ?07/14/21 Jenna Luo, MD - Family Medicine - General Exam - emphasized the importance of taking calcium 1200 mg daily and vitamin D 1000 units daily. Recommended both the shingles and COVID vaccine. Follow up as scheduled.  ? ? ?Recent consult visits:  ?08/26/21 Allena Katz, NP - Cardiology - CHF - Labs were ordered. No medication changes. Follow up in 3 months.  ? ? ?Hospital visits:  ?None in previous 6 months ? ? ?Medications: ?Outpatient Encounter Medications as of 11/19/2021  ?Medication Sig  ? acetaminophen (TYLENOL) 500 MG tablet Take 500-1,000 mg by mouth every 6 (six) hours as needed for headache (pain).  ? amLODipine (NORVASC) 5 MG tablet Take 2 tablets (10 mg total) by mouth daily.  ? apixaban (ELIQUIS) 5 MG TABS tablet Take 1 tablet by mouth twice daily  ? Ascorbic Acid (VITAMIN C) 1000 MG tablet Take 1,000 mg by mouth once a week.  ? Azelastine HCl 137 MCG/SPRAY SOLN Place 1 spray into both nostrils 2 (two) times daily.  ? Cholecalciferol (VITAMIN D3) 25 MCG (1000 UT) CAPS Take  1,000 Units by mouth once a week.  ? clobetasol ointment (TEMOVATE) 0.05 % clobetasol 0.05 % topical ointment ? APPLY TO THE AFFECTED AREAS TWICE DAILY WHILE FLARED FOR TWO WEEKS. THEN USE ONCE OR TWICE WEEKLY AS MAINTENANCE  ? clonazePAM (KLONOPIN) 0.5 MG tablet Take 1 tablet by mouth twice daily as needed for anxiety  ? cloNIDine (CATAPRES) 0.1 MG tablet Take 1 tablet (0.1 mg total) by mouth 2 (two) times daily.  ? clotrimazole-betamethasone (LOTRISONE) cream Apply 1 application topically as needed.  ? famotidine (PEPCID) 20 MG tablet Take 20 mg by mouth at bedtime. As needed  ? ferrous sulfate 325 (65 FE) MG tablet Take 1 tablet (325 mg total) by mouth daily with breakfast.  ? fluticasone (FLONASE) 50 MCG/ACT nasal spray Place 1 spray into both nostrils as needed for allergies or rhinitis.  ? HYDROcodone-acetaminophen (NORCO) 5-325 MG tablet Take 1 tablet by mouth every 6 (six) hours as needed for moderate pain.  ? losartan (COZAAR) 100 MG tablet Take 1 tablet by mouth once daily  ? nitroGLYCERIN (NITROSTAT) 0.4 MG SL tablet DISSOLVE ONE TABLET UNDER THE TONGUE EVERY 5 MINUTES AS NEEDED FOR CHEST PAIN.  DO NOT EXCEED A TOTAL OF 3 DOSES IN 15 MINUTES  ? spironolactone (ALDACTONE) 25 MG tablet Take 25 mg by mouth daily.  ? torsemide (DEMADEX) 20 MG tablet Take 4 tablets (80 mg total) by mouth daily.  ? ?  No facility-administered encounter medications on file as of 11/19/2021.  ? ? ? ?Have you had any problems recently with your health? ?Patient reported she has had some shortness of breath. She denied any chest pain and reports this has been an ongoing issue. She did confirm compliance taking her fluid pills. Advised her to contact PCP for possible appointment and if increased to seek emergent care. Patient declined and stated "its not that bad" and she wold contact PCP one day next week. She did voice understanding and agreed to seek urgent attention if her symptoms did not improve.  ? ?Have you had any problems  with your pharmacy? ?Patient denied having any problems with her current pharmacy.  ? ?What issues or side effects are you having with your medications? ?Patient denied having any issues or side effects with his current medications.  ? ?What would you like me to pass along to [name], CPP for them to help you with?  ?Patient did not have anything additional to pass along to CPP at this time.  ? ?What can we do to take care of you better? ?Patient did not have any recommendation at this time. She will contact her PCP as advised if her symptoms continue.  ? ? ? ?Care Gaps ? ?AWV: done 02/17/21 ?Colonoscopy: done 01/15/15 ?DM Eye Exam: N/A ?DM Foot Exam: N/A ?Microalbumin: N/A ?HbgAIC: N/A ?DEXA: done 02/04/21 ?Mammogram: done 03/11/21 ? ? ?Star Rating Drugs: ?Losartan (COZAAR) 100 MG tablet - last filled 09/18/21 90 days  ? ? ?Future Appointments  ?Date Time Provider [place]  ?12/01/2021 10:40 AM Larey Dresser, MD [facility] None  ?12/24/2021  3:00 PM [facility] PHARMACIST [facility]  ? ? ? ?[name], CCMA ?Clinical Pharmacist Assistant  ?[phone] ? ? ?

## 2021-11-20 DIAGNOSIS — H02052 Trichiasis without entropian right lower eyelid: Secondary | ICD-10-CM | POA: Diagnosis not present

## 2021-11-25 ENCOUNTER — Encounter: Payer: Self-pay | Admitting: Podiatry

## 2021-11-25 ENCOUNTER — Other Ambulatory Visit: Payer: Self-pay

## 2021-11-25 ENCOUNTER — Ambulatory Visit: Payer: Medicare Other | Admitting: Podiatry

## 2021-11-25 DIAGNOSIS — M79676 Pain in unspecified toe(s): Secondary | ICD-10-CM | POA: Diagnosis not present

## 2021-11-25 DIAGNOSIS — L97511 Non-pressure chronic ulcer of other part of right foot limited to breakdown of skin: Secondary | ICD-10-CM

## 2021-11-25 DIAGNOSIS — D689 Coagulation defect, unspecified: Secondary | ICD-10-CM | POA: Diagnosis not present

## 2021-11-25 DIAGNOSIS — B351 Tinea unguium: Secondary | ICD-10-CM | POA: Diagnosis not present

## 2021-11-25 MED ORDER — AMOXICILLIN-POT CLAVULANATE 875-125 MG PO TABS: 1.0000 | ORAL_TABLET | Freq: Two times a day (BID) | ORAL | 0 refills | Status: DC

## 2021-11-25 NOTE — Progress Notes (Signed)
Subjective:  Patient ID: [name], female    DOB: 1934-10-11,  MRN: [id number] HPI Chief Complaint  Patient presents with   Toe Pain    Hallux bilateral - thick and long toenails, developing wound to tip of right hallux   New Patient (Initial Visit)    Est pt 2019    87 y.o. female presents with the above complaint.   ROS: Denies fever chills nausea vomiting muscle aches pains calf pain back pain chest pain shortness of breath.  Past Medical History:  Diagnosis Date   Allergy    rhinitis   Aortic stenosis    mild by echo 06/2017   Arthritis    Bradycardia    a. 10/2017 -> beta blocker cut back due to HR 39.   Breast cancer (HCC) 01/06/2012   Cancer (HCC)    right colon and left breast   Chronic diastolic CHF (congestive heart failure) (HCC)    Colon cancer (HCC) 01/06/2012   Colovesical fistula    Dr. Carolynne Edouard and Dr. Laverle Patter planning surgery 08/2018- surgery revealed spontaneous closure   COPD (chronic obstructive pulmonary disease) (HCC)    pt. denies   Coronary artery disease 2006   a.  NSTEMI in 2016, cath showed 15% prox-mid RCA, 20% prox LAD, EF 25-35% by cath and 35-40% -> felt due to Takotsubo cardiomyopathy.   Dilated aortic root (HCC)    by echo 06/2017   Diverticulosis    Dyspnea    Edema extremities    GERD (gastroesophageal reflux disease)    Hernia    Hiatal hernia    denies   Hyperlipidemia    Hypertension    Mild aortic stenosis    echo 11/2015 but not noted on echo 06/2016   Osteopenia    Permanent atrial fibrillation (HCC)    chronic atrial fibrillation   Pneumonia    hx child   Pulmonary HTN (HCC)    a. moderate to severe PASP echo 11/2015 - now by echo 06/2017. CTA chest in 11/16 with no PE. PFTs in 7/15 with mild obstructive lung disease. She had a negative sleep study in 2017. b. Felt due to left sided HF.   Stroke [facility])    Takotsubo syndrome 07/29/2015   a. EF 35-40% by echo; akinesis of mid-apical anteroseptal and apical  myocardium.  EF now normalized on echo 11/2015   Past Surgical History:  Procedure Laterality Date   APPENDECTOMY     BREAST SURGERY     lumpectomy left   CARDIAC CATHETERIZATION     CARDIAC CATHETERIZATION N/A 07/28/2015   Procedure: Left Heart Cath and Coronary Angiography;  Surgeon: Peter M Swaziland, MD;  Location: [facility] INVASIVE CV LAB;  Service: Cardiovascular;  Laterality: N/A;   CHOLECYSTECTOMY     COLECTOMY     right side   CYSTOSCOPY WITH STENT PLACEMENT Left 09/04/2018   Procedure: CYSTOSCOPY WITH LEFT STENT PLACEMENT, BLADDER REPAIR, CYSTOSCOPY WITH LEFT STENT REMOVAL;  Surgeon: Heloise Purpura, MD;  Location: [facility] ORS;  Service: Urology;  Laterality: Left;   EXCISION OF ACCESSORY NIPPLE Bilateral 05/30/2013   Procedure: BILATERAL NIPPLE BIOPSY;  Surgeon: Robyne Askew, MD;  Location: [facility] OR;  Service: General;  Laterality: Bilateral;   EYE SURGERY Bilateral 12   cataracts   HYSTEROSCOPY WITH D & C N/A 05/29/2020   Procedure: DILATATION AND CURETTAGE /HYSTEROSCOPY;  Surgeon: Natale Milch, MD;  Location: [facility] ORS;  Service: Gynecology;  Laterality: N/A;   IR RADIOLOGIST  EVAL & MGMT  07/26/2017   LAPAROSCOPIC RIGHT COLECTOMY N/A 09/04/2018   Procedure: LAPAROSCOPIC ASSISTED SIGMOID COLECTOMY WITH REPAIR OF FISTULA TO BLADDER;  Surgeon: Griselda Miner, MD;  Location: [facility] ORS;  Service: General;  Laterality: N/A;   RIGHT/LEFT HEART CATH AND CORONARY ANGIOGRAPHY N/A 12/10/2020   Procedure: RIGHT/LEFT HEART CATH AND CORONARY ANGIOGRAPHY;  Surgeon: Laurey Morale, MD;  Location: [facility] INVASIVE CV LAB;  Service: Cardiovascular;  Laterality: N/A;   SPLIT NIGHT STUDY  02/02/2016        Current Outpatient Medications:    amoxicillin-clavulanate (AUGMENTIN) 875-125 MG tablet, Take 1 tablet by mouth 2 (two) times daily., Disp: 20 tablet, Rfl: 0   acetaminophen (TYLENOL) 500 MG tablet, Take 500-1,000 mg by mouth every 6 (six) hours as needed for headache (pain)., Disp: , Rfl:    amLODipine  (NORVASC) 5 MG tablet, Take 2 tablets (10 mg total) by mouth daily., Disp: 135 tablet, Rfl: 3   apixaban (ELIQUIS) 5 MG TABS tablet, Take 1 tablet by mouth twice daily, Disp: 60 tablet, Rfl: 6   Ascorbic Acid (VITAMIN C) 1000 MG tablet, Take 1,000 mg by mouth once a week., Disp: , Rfl:    Azelastine HCl 137 MCG/SPRAY SOLN, Place 1 spray into both nostrils 2 (two) times daily., Disp: , Rfl:    Cholecalciferol (VITAMIN D3) 25 MCG (1000 UT) CAPS, Take 1,000 Units by mouth once a week., Disp: , Rfl:    clobetasol ointment (TEMOVATE) 0.05 %, clobetasol 0.05 % topical ointment  APPLY TO THE AFFECTED AREAS TWICE DAILY WHILE FLARED FOR TWO WEEKS. THEN USE ONCE OR TWICE WEEKLY AS MAINTENANCE, Disp: , Rfl:    clonazePAM (KLONOPIN) 0.5 MG tablet, Take 1 tablet by mouth twice daily as needed for anxiety, Disp: 60 tablet, Rfl: 0   cloNIDine (CATAPRES) 0.1 MG tablet, Take 1 tablet (0.1 mg total) by mouth 2 (two) times daily., Disp: 180 tablet, Rfl: 2   clotrimazole-betamethasone (LOTRISONE) cream, Apply 1 application topically as needed., Disp: , Rfl:    famotidine (PEPCID) 20 MG tablet, Take 20 mg by mouth at bedtime. As needed, Disp: , Rfl:    ferrous sulfate 325 (65 FE) MG tablet, Take 1 tablet (325 mg total) by mouth daily with breakfast., Disp: 90 tablet, Rfl: 3   fluticasone (FLONASE) 50 MCG/ACT nasal spray, Place 1 spray into both nostrils as needed for allergies or rhinitis., Disp: , Rfl:    HYDROcodone-acetaminophen (NORCO) 5-325 MG tablet, Take 1 tablet by mouth every 6 (six) hours as needed for moderate pain., Disp: 30 tablet, Rfl: 0   losartan (COZAAR) 100 MG tablet, Take 1 tablet by mouth once daily, Disp: 90 tablet, Rfl: 1   nitroGLYCERIN (NITROSTAT) 0.4 MG SL tablet, DISSOLVE ONE TABLET UNDER THE TONGUE EVERY 5 MINUTES AS NEEDED FOR CHEST PAIN.  DO NOT EXCEED A TOTAL OF 3 DOSES IN 15 MINUTES, Disp: 25 tablet, Rfl: 8   spironolactone (ALDACTONE) 25 MG tablet, Take 25 mg by mouth daily., Disp: , Rfl:     torsemide (DEMADEX) 20 MG tablet, Take 4 tablets (80 mg total) by mouth daily., Disp: 120 tablet, Rfl: 11  Allergies  Allergen Reactions   Contrast Media [Iodinated Contrast Media] Hives and Other (See Comments)    Per pt strong burning sensation starting in chest radiating outward    Clonidine Derivatives Other (See Comments)    Throat dry   Statins Other (See Comments)    "bones hurt"   Sulfa Antibiotics Diarrhea  Tremors    Celebrex [Celecoxib] Rash   Isosorbide Nitrate Itching and Rash   Other Itching and Rash    Plastic and paper tape and heart monitor pads   Tape Itching and Rash    Red Where applied and will spread   Review of Systems Objective:  There were no vitals filed for this visit.  General: Well developed, nourished, in no acute distress, alert and oriented x3   Dermatological: Skin is warm, dry and supple bilateral. Nails x 10 thick yellow dystrophic clinically mycotic; remaining integument appears unremarkable at this time. There are no open sores, no preulcerative lesions, no rash or signs of infection present.  Hallux right demonstrates erythema and edema with circumferential halo around an area of erythema to the right hallux it appears that it has been rubbing and adenitis of cellulitis possibly.  Vascular: Dorsalis Pedis artery and Posterior Tibial artery pedal pulses are 2/4 bilateral with immedate capillary fill time. Pedal hair growth present. No varicosities and no lower extremity edema present bilateral.   Neruologic: Grossly intact via light touch bilateral. Vibratory intact via tuning fork bilateral. Protective threshold with Semmes Wienstein monofilament intact to all pedal sites bilateral. Patellar and Achilles deep tendon reflexes 2+ bilateral. No Babinski or clonus noted bilateral.   Musculoskeletal: No gross boney pedal deformities bilateral. No pain, crepitus, or limitation noted with foot and ankle range of motion bilateral. Muscular strength  5/5 in all groups tested bilateral.  Gait: Unassisted, Nonantalgic.    Radiographs:  None taken  Assessment & Plan:   Assessment: Cellulitis hallux right.  Painful elongated nails 1 through 5 bilateral  Plan: Start her on Augmentin 875 1 p.o. twice daily.  Epsom salt soaks daily.  Use of a Darco shoe to inhibit rubbing on the tip of the shoe.  Debridement of nails 1 through 5 bilateral.  Follow-up with her in about 2 weeks     Yassmin Binegar T. [place], [place]

## 2021-11-26 ENCOUNTER — Other Ambulatory Visit: Payer: Self-pay | Admitting: Cardiology

## 2021-11-26 ENCOUNTER — Other Ambulatory Visit: Payer: Self-pay | Admitting: Family Medicine

## 2021-11-26 ENCOUNTER — Other Ambulatory Visit (HOSPITAL_COMMUNITY): Payer: Self-pay | Admitting: Family Medicine

## 2021-11-26 DIAGNOSIS — K5792 Diverticulitis of intestine, part unspecified, without perforation or abscess without bleeding: Secondary | ICD-10-CM

## 2021-11-26 DIAGNOSIS — F064 Anxiety disorder due to known physiological condition: Secondary | ICD-10-CM

## 2021-11-27 NOTE — Telephone Encounter (Signed)
LOV 10/08/21 ?Last refill 10/02/21, #60, 0 refills ? ?Please review, thanks! ? ?

## 2021-12-01 ENCOUNTER — Encounter (HOSPITAL_COMMUNITY): Payer: Self-pay | Admitting: Cardiology

## 2021-12-01 ENCOUNTER — Ambulatory Visit (HOSPITAL_COMMUNITY)
Admission: RE | Admit: 2021-12-01 | Discharge: 2021-12-01 | Disposition: A | Discharge status: Home / Self Care | Payer: Medicare Other | Source: Ambulatory Visit | Attending: Cardiology | Admitting: Cardiology

## 2021-12-01 ENCOUNTER — Other Ambulatory Visit (HOSPITAL_COMMUNITY): Payer: Self-pay

## 2021-12-01 ENCOUNTER — Other Ambulatory Visit: Payer: Self-pay

## 2021-12-01 VITALS — BP 150/80 | HR 63 | Wt 177.4 lb

## 2021-12-01 DIAGNOSIS — Z79899 Other long term (current) drug therapy: Secondary | ICD-10-CM | POA: Insufficient documentation

## 2021-12-01 DIAGNOSIS — E785 Hyperlipidemia, unspecified: Secondary | ICD-10-CM | POA: Diagnosis not present

## 2021-12-01 DIAGNOSIS — I110 Hypertensive heart disease with heart failure: Secondary | ICD-10-CM | POA: Insufficient documentation

## 2021-12-01 DIAGNOSIS — R0789 Other chest pain: Secondary | ICD-10-CM | POA: Insufficient documentation

## 2021-12-01 DIAGNOSIS — I482 Chronic atrial fibrillation, unspecified: Secondary | ICD-10-CM | POA: Diagnosis not present

## 2021-12-01 DIAGNOSIS — Z7901 Long term (current) use of anticoagulants: Secondary | ICD-10-CM | POA: Insufficient documentation

## 2021-12-01 DIAGNOSIS — I2510 Atherosclerotic heart disease of native coronary artery without angina pectoris: Secondary | ICD-10-CM | POA: Diagnosis not present

## 2021-12-01 DIAGNOSIS — I5032 Chronic diastolic (congestive) heart failure: Secondary | ICD-10-CM | POA: Insufficient documentation

## 2021-12-01 DIAGNOSIS — Z8249 Family history of ischemic heart disease and other diseases of the circulatory system: Secondary | ICD-10-CM | POA: Insufficient documentation

## 2021-12-01 DIAGNOSIS — I77810 Thoracic aortic ectasia: Secondary | ICD-10-CM

## 2021-12-01 LAB — BASIC METABOLIC PANEL
Anion gap: 10 (ref 5–15)
BUN: 42 mg/dL — ABNORMAL HIGH (ref 8–23)
CO2: 25 mmol/L (ref 22–32)
Calcium: 9.6 mg/dL (ref 8.9–10.3)
Chloride: 100 mmol/L (ref 98–111)
Creatinine, Ser: 1.24 mg/dL — ABNORMAL HIGH (ref 0.44–1.00)
GFR, Estimated: 43 mL/min — ABNORMAL LOW (ref >60)
Glucose, Bld: 106 mg/dL — ABNORMAL HIGH (ref 70–99)
Potassium: 4 mmol/L (ref 3.5–5.1)
Sodium: 135 mmol/L (ref 135–145)

## 2021-12-01 LAB — CBC
HCT: 33.7 % — ABNORMAL LOW (ref 36.0–46.0)
Hemoglobin: 10.9 g/dL — ABNORMAL LOW (ref 12.0–15.0)
MCH: 30.7 pg (ref 26.0–34.0)
MCHC: 32.3 g/dL (ref 30.0–36.0)
MCV: 94.9 fL (ref 80.0–100.0)
Platelets: 235 10*3/uL (ref 150–400)
RBC: 3.55 MIL/uL — ABNORMAL LOW (ref 3.87–5.11)
RDW: 14.6 % (ref 11.5–15.5)
WBC: 6.1 10*3/uL (ref 4.0–10.5)
nRBC: 0 % (ref 0.0–0.2)

## 2021-12-01 LAB — LIPID PANEL
Cholesterol: 90 mg/dL (ref 0–200)
HDL: 28 mg/dL — ABNORMAL LOW (ref >40)
LDL Cholesterol: 52 mg/dL (ref 0–99)
Total CHOL/HDL Ratio: 3.2 RATIO
Triglycerides: 49 mg/dL (ref <150)
VLDL: 10 mg/dL (ref 0–40)

## 2021-12-01 LAB — BRAIN NATRIURETIC PEPTIDE: B Natriuretic Peptide: 109.7 pg/mL — ABNORMAL HIGH (ref 0.0–100.0)

## 2021-12-01 LAB — TSH: TSH: 3.878 u[IU]/mL (ref 0.350–4.500)

## 2021-12-01 MED ORDER — TORSEMIDE 20 MG PO TABS
ORAL_TABLET | ORAL | 11 refills | Status: DC
Start: 2021-12-01 — End: 2022-01-01

## 2021-12-01 MED ORDER — APIXABAN 5 MG PO TABS
5.0000 mg | ORAL_TABLET | Freq: Two times a day (BID) | ORAL | 6 refills | Status: DC
  Filled 2021-12-01 – 2022-02-22 (×4): qty 60, 30d supply, fill #0
  Filled 2022-04-02: qty 60, 30d supply, fill #1
  Filled 2022-04-27: qty 60, 30d supply, fill #2

## 2021-12-01 NOTE — Progress Notes (Signed)
ReDS Vest / Clip - 12/01/21 1200   ? ?  ? ReDS Vest / Clip  ? Station Marker A   ? Ruler Value 25   ? ReDS Value Range High volume overload   ? ReDS Actual Value 46   ? ?  ?  ? ?  ? ? ?

## 2021-12-01 NOTE — Patient Instructions (Signed)
Medication Changes: ? ?Increase Torsemide to 80 mg in the morning and 40 mg in the evevning ? ?Lab Work: ? ?Labs done today, your results will be available in MyChart, we will contact you for abnormal readings. ? ? ?Testing/Procedures: ? ?Repeat blood work in 10 days ? ?Referrals: ? ?none ? ?Special Instructions // Education: ? ?none ? ?Follow-Up in: 4 weeks  ? ?At the [facility], you and your health needs are our priority. We have a designated team specialized in the treatment of Heart Failure. This Care Team includes your primary Heart Failure Specialized Cardiologist (physician), Advanced Practice Providers (APPs- Physician Assistants and Nurse Practitioners), and Pharmacist who all work together to provide you with the care you need, when you need it.  ? ?You [place] see any of the following providers on your designated Care Team at your next follow up: ? ?Dr Glori Bickers ?Dr Loralie Champagne ?Darrick Grinder, NP ?[name], PA ?Jessica Milford,NP ?Marlyce Huge, PA ?Audry Riles, PharmD ? ? ?Please be sure to bring in all your medications bottles to every appointment.  ? ?Need to Contact us: ? ?If you have any questions or concerns before your next appointment please send [place] a message through [place] or call our office at [phone].   ? ?TO LEAVE A MESSAGE FOR THE NURSE SELECT OPTION 2, PLEASE LEAVE A MESSAGE INCLUDING: ?YOUR NAME ?DATE OF BIRTH ?CALL BACK NUMBER ?REASON FOR CALL**this is important as we prioritize the call backs ? ?YOU WILL RECEIVE A CALL BACK THE SAME DAY AS LONG AS YOU CALL BEFORE 4:00 PM ? ? ?

## 2021-12-01 NOTE — Progress Notes (Signed)
Patient ID: [name], female   DOB: 08-Jun-1935, 87 y.o.   MRN: [id number] ? ?  ?Advanced Heart Failure Clinic Note  ? ?PCP: [name], MD ?Cardiology: [name] ?HF Cardiology: Aundra Dubin ? ?87 y.o. with history of poorly controlled HTN, chronic atrial fibrillation, chronic diastolic CHF, and prior episode of Takotsubo cardiomyopathy was referred by to HF clinic by Dr. [name]. Patient has had exertional dyspnea since 11/16.  At that time, she presented to the hospital with chest pain.  Coronary angiography showed no significant CAD.  EF was 35-40% by echo, suspected Takotsubo cardiomyopathy.  Her echo in 3/17 showed EF improved to 98-92% but PA systolic pressure elevated at 65 mmHg.  We transitioned her to torsemide.  ? ?RHC/LHC in 4/22 showed nonobstructive CAD; mean RA 5, PA 44/10, mean PCWP 20, CI 4.89, PVR < 1 WU.  Echo in 8/22 showed EF up to 65-70%, mild LVH, normal RV, PASP 67, moderate RAE, trivial MR. PYP scan in 9/22 was probably negative.  ? ?Patient returns for followup of CHF.  She has been more short of breath recently.  She is dyspneic walking 50-100 feet, sometimes short of breath in the house.   Weight up 14 lbs.  SBP in 120s generally though elevated in the office today.  No orthopnea/PND.  She still gets occasional atypical chest pain, will take NTG with resolution. No change to this pattern.   ? ?REDS clip 46% ? ?Labs (11/16): LDL 80 ?Labs (4/17): K 4.5, creatinine 1.1, BNP 132 ?Labs (5/17): LDL 92 ?Labs (3/18): K 4, creatinine 0.98, LDL 81, proBNP 834 ?Labs (6/18): K 4.1, creatinine 1.37, hgb 12.5 ?Labs (10/21): LDL 79, HDL 71 ?Labs (5/22): myeloma panel and urine immunofixation negative ?Labs (7/22): K 4.4, creatinine 1.19 ?Labs (2/23): K 4.5, creatinine 1.37, hgb 9.7 ? ?PMH: ?1. Hyperlipidemia: Myalgias with atorvastatin, Crestor, simvastatin. Myalgias with Zetia.  ?2. Chronic atrial fibrillation ?3. HTN: Poor control.  ?4. H/o breast cancer: 2013.  ?5. GERD ?6. Takotsubo cardiomyopathy:  Admitted in 11/16 with chest pain.  Coronary angiography showed nonobstructive CAD.  Echo (11/16) showed EF 35-40%.  EF back to normal by 3/17 echo.  ?7. Chronic diastolic CHF ?- Echo (1/19) with EF 60-65%, mild aortic stenosis, PA systolic pressure 65 mmHg, RV mildly dilated with normal systolic function.  ?- Cardiolite (4/17) with EF 61%, small reversible mid anteroseptal/apical lateral defects thought to be related to shifting breast artifact => low risk study.  ?- Echo (12/21): EF 65-70%, normal RV, PASP 60 mmHg, mild AS, mild-moderate TR.   ?- RHC/LHC in 4/22 showed nonobstructive CAD; mean RA 5, PA 44/10, mean PCWP 20, CI 4.89, PVR < 1 WU.   ?- Echo in 8/22 showed EF up to 65-70%, mild LVH, normal RV, PASP 67, moderate RAE, trivial MR.  ?- PYP scan (9/22): H/CL < 1.5, grade 1.  Probably negative.   ?8. Ascending aortic aneurysm: 4.3 cm by CTA in 11/16.  ?9. Pulmonary hypertension: PASP 65 mmHg by echo in 3/17.  CTA chest in 11/16 with no PE.  PFTs in 7/15 with mild obstructive lung disease.  ?10. Sleep study negative 2017. ?11. Bradycardia: off nodal blockers.  ?- Holter (8/20): Average HR 51, afib ?12. Aortic stenosis: Mild.  ? ?Social History  ? ?Socioeconomic History  ? Marital status: Married  ?  Spouse name: Not on file  ? Number of children: Not on file  ? Years of education: Not on file  ? Highest education level: Not  on file  ?Occupational History  ? Not on file  ?Tobacco Use  ? Smoking status: Never  ? Smokeless tobacco: Never  ?Vaping Use  ? Vaping Use: Never used  ?Substance and Sexual Activity  ? Alcohol use: No  ? Drug use: No  ? Sexual activity: Not Currently  ?Other Topics Concern  ? Not on file  ?Social History Narrative  ? Not on file  ? ?Social Determinants of Health  ? ?Financial Resource Strain: Not on file  ?Food Insecurity: Not on file  ?Transportation Needs: Not on file  ?Physical Activity: Not on file  ?Stress: Not on file  ?Social Connections: Not on file  ?Intimate Partner Violence:  Not on file  ? ?Family History  ?Problem Relation Age of Onset  ? Heart disease Mother   ? Heart attack Mother   ? Cancer Sister   ?     stomach and colon  ? Heart disease Brother 48  ? Hypertension Father   ? Cancer Sister   ? Stroke Neg Hx   ? ?ROS: All systems reviewed and negative except as per HPI.  ? ?Current Outpatient Medications  ?Medication Sig Dispense Refill  ? acetaminophen (TYLENOL) 500 MG tablet Take 500-1,000 mg by mouth every 6 (six) hours as needed for headache (pain).    ? amLODipine (NORVASC) 5 MG tablet Take 2 tablets by mouth once daily 135 tablet 0  ? amoxicillin-clavulanate (AUGMENTIN) 875-125 MG tablet Take 1 tablet by mouth 2 (two) times daily. 20 tablet 0  ? Ascorbic Acid (VITAMIN C) 1000 MG tablet Take 1,000 mg by mouth once a week.    ? Azelastine HCl 137 MCG/SPRAY SOLN Place 1 spray into both nostrils 2 (two) times daily.    ? Cholecalciferol (VITAMIN D3) 25 MCG (1000 UT) CAPS Take 1,000 Units by mouth once a week.    ? clobetasol ointment (TEMOVATE) 0.05 % clobetasol 0.05 % topical ointment ? APPLY TO THE AFFECTED AREAS TWICE DAILY WHILE FLARED FOR TWO WEEKS. THEN USE ONCE OR TWICE WEEKLY AS MAINTENANCE    ? clonazePAM (KLONOPIN) 0.5 MG tablet Take 1 tablet by mouth twice daily as needed for anxiety 60 tablet 0  ? cloNIDine (CATAPRES) 0.1 MG tablet Take 1 tablet (0.1 mg total) by mouth 2 (two) times daily. 180 tablet 2  ? clotrimazole-betamethasone (LOTRISONE) cream Apply 1 application topically as needed.    ? famotidine (PEPCID) 20 MG tablet Take 20 mg by mouth at bedtime. As needed    ? ferrous sulfate 325 (65 FE) MG tablet Take 1 tablet (325 mg total) by mouth daily with breakfast. 90 tablet 3  ? fluticasone (FLONASE) 50 MCG/ACT nasal spray Place 1 spray into both nostrils as needed for allergies or rhinitis.    ? HYDROcodone-acetaminophen (NORCO) 5-325 MG tablet Take 1 tablet by mouth every 6 (six) hours as needed for moderate pain. 30 tablet 0  ? losartan (COZAAR) 100 MG tablet  Take 1 tablet by mouth once daily 90 tablet 1  ? nitroGLYCERIN (NITROSTAT) 0.4 MG SL tablet DISSOLVE ONE TABLET UNDER THE TONGUE EVERY 5 MINUTES AS NEEDED FOR CHEST PAIN.  DO NOT EXCEED A TOTAL OF 3 DOSES IN 15 MINUTES 25 tablet 8  ? spironolactone (ALDACTONE) 25 MG tablet Take 1 tablet by mouth once daily 90 tablet 0  ? apixaban (ELIQUIS) 5 MG TABS tablet Take 1 tablet (5 mg total) by mouth 2 (two) times daily. 60 tablet 6  ? torsemide (DEMADEX) 20 MG tablet Take  4 tablets (80 mg total) by mouth every morning AND 2 tablets (40 mg total) every evening. 180 tablet 11  ? ?No current facility-administered medications for this encounter.  ? ?BP (!) 150/80   Pulse 63   Wt 80.5 kg (177 lb 6.4 oz)   SpO2 95%   BMI 31.42 kg/m?   ? ?Wt Readings from Last 3 Encounters:  ?12/01/21 80.5 kg (177 lb 6.4 oz)  ?10/08/21 78.5 kg (173 lb)  ?10/05/21 81.2 kg (179 lb)  ?  ?General: NAD ?Neck: JVP 8-9 cm with HJR, no thyromegaly or thyroid nodule.  ?Lungs: Clear to auscultation bilaterally with normal respiratory effort. ?CV: Nondisplaced PMI.  Heart irregular S1/S2, no S3/S4, 2/6 SEM RUSB.  1+ edema to knees.  No carotid bruit.  Normal pedal pulses.  ?Abdomen: Soft, nontender, no hepatosplenomegaly, no distention.  ?Skin: Intact without lesions or rashes.  ?Neurologic: Alert and oriented x 3.  ?Psych: Normal affect. ?Extremities: No clubbing or cyanosis.  ?HEENT: Normal.  ? ?Assessment/Plan: ?1. Chronic atrial fibrillation: She is off nodal blockade with HR in 60s today.  ?- Continue Eliquis 5 mg bid.   CBC today.  ?2. HTN: BP controlled.  Continue current regimen.  ?3. Chronic diastolic CHF: NYHA class III symptoms, slowly progressive over time.  Mild volume overload on exam.  RHC/LHC in 4/22 showed nonobstructive CAD; mean RA 5, PA 44/10, mean PCWP 20, CI 4.89, PVR < 1 WU (pulmonary venous hypertension).  Echo in 8/22 showed EF up to 65-70%, mild LVH, normal RV, PASP 67, moderate RAE, trivial MR. PYP scan in 9/22 was probably  negative. She is volume overloaded today by exam and REDS clip, weight is up.  NYHA class III symptoms.  ?- Increase torsemide to 80 mg qam/40 qpm. BMET/BNP today, BMET 10 days.  ?- Unable to tolerate Jardiance due

## 2021-12-09 ENCOUNTER — Ambulatory Visit (INDEPENDENT_AMBULATORY_CARE_PROVIDER_SITE_OTHER): Payer: Medicare Other | Admitting: Podiatry

## 2021-12-09 ENCOUNTER — Encounter: Payer: Self-pay | Admitting: Podiatry

## 2021-12-09 DIAGNOSIS — L97511 Non-pressure chronic ulcer of other part of right foot limited to breakdown of skin: Secondary | ICD-10-CM | POA: Diagnosis not present

## 2021-12-09 NOTE — Progress Notes (Signed)
She presents today for follow-up of ulceration hallux right states it is looking better and feeling much better. ? ?Objective: Vital signs are stable she is alert oriented x3 presents in her Darco shoe today with a light dressing on the toe. ? ?Objective: Band-Aid was removed the ulceration has nearly healed 100% I did debride some of the reactive hyperkeratotic tissue and some of the necrotic tissue just beneath it.  The ulceration measures about 2 mm in total diameter there is no purulence no malodor. ? ?Assessment: Slowly healing ulceration distal aspect of the hallux right. ? ?Plan: Debrided the wound today placed silver nitrate on the wound and redressed it.  She will continue dressing changes until I see her in the next couple of weeks. ?

## 2021-12-14 ENCOUNTER — Ambulatory Visit (HOSPITAL_COMMUNITY)
Admission: RE | Admit: 2021-12-14 | Discharge: 2021-12-14 | Disposition: A | Discharge status: Home / Self Care | Payer: Medicare Other | Source: Ambulatory Visit | Attending: Internal Medicine | Admitting: Internal Medicine

## 2021-12-14 DIAGNOSIS — I5032 Chronic diastolic (congestive) heart failure: Secondary | ICD-10-CM | POA: Insufficient documentation

## 2021-12-14 LAB — BASIC METABOLIC PANEL
Anion gap: 8 (ref 5–15)
BUN: 54 mg/dL — ABNORMAL HIGH (ref 8–23)
CO2: 28 mmol/L (ref 22–32)
Calcium: 9.8 mg/dL (ref 8.9–10.3)
Chloride: 100 mmol/L (ref 98–111)
Creatinine, Ser: 1.4 mg/dL — ABNORMAL HIGH (ref 0.44–1.00)
GFR, Estimated: 37 mL/min — ABNORMAL LOW (ref >60)
Glucose, Bld: 116 mg/dL — ABNORMAL HIGH (ref 70–99)
Potassium: 4.9 mmol/L (ref 3.5–5.1)
Sodium: 136 mmol/L (ref 135–145)

## 2021-12-15 NOTE — Progress Notes (Signed)
? ?Chronic Care Management ?Pharmacy Note ? ?12/24/2021 ?Name:  [name] Below MRN:  [id number] DOB:  06-Jul-1935 ? ?Summary: ?PharmD FU.  Patient doing well overall, having some knee pain.  She does not feel like increased torsemide is doing much but does report some weight loss and less SOB. ? ? ?Subjective: ?[name] is an 87 y.o. year old female who is a primary patient of [name], Cammie Mcgee, MD.  The CCM team was consulted for assistance with disease management and care coordination needs.   ? ?Engaged with patient by telephone for initial visit in response to provider referral for pharmacy case management and/or care coordination services.  ? ?Consent to Services:  ?The patient was given the following information about Chronic Care Management services today, agreed to services, and gave verbal consent: 1. CCM service includes personalized support from designated clinical staff supervised by the primary care provider, including individualized plan of care and coordination with other care providers 2. 24/7 contact phone numbers for assistance for urgent and routine care needs. 3. Service will only be billed when office clinical staff spend 20 minutes or more in a month to coordinate care. 4. Only one practitioner [place] furnish and bill the service in a calendar month. 5.The patient [place] stop CCM services at any time (effective at the end of the month) by phone call to the office staff. 6. The patient will be responsible for cost sharing (co-pay) of up to 20% of the service fee (after annual deductible is met). Patient agreed to services and consent obtained. ? ?Patient Care Team: ?[name], MD as PCP - General (Family Medicine) ?[name], MD as PCP - Cardiology (Cardiology) ?Edythe Clarity, [facility] as Pharmacist (Pharmacist) ? ?Recent office visits:  ?10/08/21 Jenna Luo, MD - Family Medicine - Dyspnea - Labs were ordered. Reduced fluid pill back to his original dose of 80 mg a day. Follow up as  scheduled.  ?  ?10/05/21 Jenna Luo, MD - Dyspnea - Labs were ordered. EKG performed. Increase torsemide from 80 mg in the morning to 80 mg in the morning coupled with 40 mg in the afternoon and reassess the patient on Thursday.  ?  ?09/15/21 Jenna Luo, MD - Family Medicine - Chest wall pain - predniSONE (DELTASONE) 20 MG tablet prescribed. Norco 5/325 1 p.o. every 6 hours for severe pain prescribed. Follow up if no improvement.  ?  ?08/11/21 Jenna Luo, MD - [place] were ordered. clobetasol ointment (TEMOVATE) 0.05 % prescribed. Follow up as scheduled.  ?  ?07/14/21 Jenna Luo, MD - Family Medicine - General Exam - emphasized the importance of taking calcium 1200 mg daily and vitamin D 1000 units daily. Recommended both the shingles and COVID vaccine. Follow up as scheduled.  ?  ?  ?Recent consult visits:  ?08/26/21 Allena Katz, NP - Cardiology - CHF - Labs were ordered. No medication changes. Follow up in 3 months.  ?  ?  ?Hospital visits:  ?None in previous 6 months ?  ?  ?None in the last six months ? ? ?Objective: ? ?Lab Results  ?Component Value Date  ? CREATININE 1.40 (H) 12/14/2021  ? BUN 54 (H) 12/14/2021  ? GFR 90.44 01/27/2015  ? GFRNONAA 37 (L) 12/14/2021  ? GFRAA 60 07/03/2020  ? NA 136 12/14/2021  ? K 4.9 12/14/2021  ? CALCIUM 9.8 12/14/2021  ? CO2 28 12/14/2021  ? GLUCOSE 116 (H) 12/14/2021  ? ? ?Lab Results  ?Component  Value Date/Time  ? HGBA1C 5.9 (H) 03/14/2014 09:43 AM  ? GFR 90.44 01/27/2015 11:48 AM  ? GFR 79.60 02/15/2014 10:34 AM  ?  ?Last diabetic Eye exam: No results found for: HMDIABEYEEXA  ?Last diabetic Foot exam: No results found for: HMDIABFOOTEX  ? ?Lab Results  ?Component Value Date  ? CHOL 90 12/01/2021  ? HDL 28 (L) 12/01/2021  ? Frannie 52 12/01/2021  ? TRIG 49 12/01/2021  ? CHOLHDL 3.2 12/01/2021  ? ? ? ?  Latest Ref Rng & Units 10/05/2021  ? 11:18 AM 07/03/2020  ? 10:04 AM 05/23/2020  ?  9:48 AM  ?Hepatic Function  ?Total Protein 6.1 - 8.1 g/dL 6.6    7.2   6.7    ?AST 10 - 35 U/L _0 ?ALT 6 - 29 U/L _1 ?Total Bilirubin 0.2 - 1.2 mg/dL 0.6   0.5   0.5    ? ? ?Lab Results  ?Component Value Date/Time  ? TSH 3.878 12/01/2021 11:16 AM  ? TSH 2.20 07/08/2016 09:58 AM  ? TSH 1.046 02/26/2013 10:09 AM  ? ? ? ?  Latest Ref Rng & Units 12/01/2021  ? 11:16 AM 10/08/2021  ?  2:33 PM 10/05/2021  ? 11:18 AM  ?CBC  ?WBC 4.0 - 10.5 K/uL 6.1   5.0   5.1    ?Hemoglobin 12.0 - 15.0 g/dL 10.9   9.7   9.4    ?Hematocrit 36.0 - 46.0 % 33.7   29.5   29.1    ?Platelets 150 - 400 K/uL 235   299   226    ? ? ?Lab Results  ?Component Value Date/Time  ? VD25OH 33 02/09/2013 09:47 AM  ? ? ?Clinical ASCVD: Yes  ?The ASCVD Risk score (Arnett DK, et al., 2019) failed to calculate for the following reasons: ?  The 2019 ASCVD risk score is only valid for ages 40 to 45   ? ? ?  07/14/2021  ?  9:32 AM 09/30/2020  ? 12:32 PM 07/03/2020  ?  9:43 AM  ?Depression screen PHQ 2/9  ?Decreased Interest 0 0 0  ?Down, Depressed, Hopeless 0 0 0  ?PHQ - 2 Score 0 0 0  ?  ? ?Social History  ? ?Tobacco Use  ?Smoking Status Never  ?Smokeless Tobacco Never  ? ?BP Readings from Last 3 Encounters:  ?12/01/21 (!) 150/80  ?10/08/21 (!) 112/48  ?10/05/21 (!) 122/38  ? ?Pulse Readings from Last 3 Encounters:  ?12/01/21 63  ?10/08/21 65  ?10/05/21 68  ? ?Wt Readings from Last 3 Encounters:  ?12/01/21 177 lb 6.4 oz (80.5 kg)  ?10/08/21 173 lb (78.5 kg)  ?10/05/21 179 lb (81.2 kg)  ? ?BMI Readings from Last 3 Encounters:  ?12/01/21 31.42 kg/m?  ?10/08/21 30.65 kg/m?  ?10/05/21 31.71 kg/m?  ? ? ?Assessment/Interventions: Review of patient past medical history, allergies, medications, health status, including review of consultants reports, laboratory and other test data, was performed as part of comprehensive evaluation and provision of chronic care management services.  ? ?SDOH:  (Social Determinants of Health) assessments and interventions performed: Yes ? ?Financial Resource Strain: Not on file   ? ? ?SDOH Screenings  ? ?Alcohol Screen: Low Risk   ? Last Alcohol Screening Score (AUDIT): 0  ?Depression (PHQ2-9): Low Risk   ? PHQ-2 Score: 0  ?Financial Resource Strain: Not on file  ?Food Insecurity: Not on  file  ?Housing: Not on file  ?Physical Activity: Not on file  ?Social Connections: Not on file  ?Stress: Not on file  ?Tobacco Use: Low Risk   ? Smoking Tobacco Use: Never  ? Smokeless Tobacco Use: Never  ? Passive Exposure: Not on file  ?Transportation Needs: Not on file  ? ? ?[place] ? ?Allergies  ?Allergen Reactions  ? Contrast Media [Iodinated Contrast Media] Hives and Other (See Comments)  ?  Per pt strong burning sensation starting in chest radiating outward ?  ? Clonidine Derivatives Other (See Comments)  ?  Throat dry  ? Statins Other (See Comments)  ?  "bones hurt"  ? Sulfa Antibiotics Diarrhea  ?  Tremors   ? Celebrex [Celecoxib] Rash  ? Isosorbide Nitrate Itching and Rash  ? Other Itching and Rash  ?  Plastic and paper tape and heart monitor pads  ? Tape Itching and Rash  ?  Red Where applied and will spread  ? ? ?Medications Reviewed Today   ? ? Reviewed by Edythe Clarity, RPH (Pharmacist) on 12/24/21 at 1518  Med List Status: <None>  ? ?Medication Order Taking? Sig Documenting Provider Last Dose Status Informant  ?acetaminophen (TYLENOL) 500 MG tablet [id number] Yes Take 500-1,000 mg by mouth every 6 (six) hours as needed for headache (pain). [provider] Taking Active Self  ?amLODipine (NORVASC) 5 MG tablet [id number] Yes Take 2 tablets by mouth once daily Larey Dresser, MD Taking Active   ?amoxicillin-clavulanate (AUGMENTIN) 875-125 MG tablet [id number] Yes Take 1 tablet by mouth 2 (two) times daily. Hyatt, Max T, DPM Taking Active   ?apixaban (ELIQUIS) 5 MG TABS tablet [id number] Yes Take 1 tablet (5 mg total) by mouth 2 (two) times daily. Larey Dresser, MD Taking Active   ?Ascorbic Acid (VITAMIN C) 1000 MG tablet [id number] Yes Take 1,000 mg by mouth once a week.  [provider] Taking Active Self  ?Azelastine HCl 137 MCG/SPRAY SOLN [id number] Yes Place 1 spray into both nostrils 2 (two) times daily. [provider] Taking Active   ?Cholecalciferol (VITAMIN D3)

## 2021-12-17 DIAGNOSIS — I872 Venous insufficiency (chronic) (peripheral): Secondary | ICD-10-CM | POA: Diagnosis not present

## 2021-12-23 ENCOUNTER — Ambulatory Visit: Payer: Medicare Other | Admitting: Podiatry

## 2021-12-24 ENCOUNTER — Ambulatory Visit (INDEPENDENT_AMBULATORY_CARE_PROVIDER_SITE_OTHER): Payer: Medicare Other | Admitting: Pharmacist

## 2021-12-24 DIAGNOSIS — I480 Paroxysmal atrial fibrillation: Secondary | ICD-10-CM

## 2021-12-24 DIAGNOSIS — I10 Essential (primary) hypertension: Secondary | ICD-10-CM

## 2021-12-24 NOTE — Patient Instructions (Addendum)
Visit Information ? ? Goals Addressed   ? ?  ?  ?  ?  ? This Visit's Progress  ?  Track and Manage My Blood Pressure-Hypertension   On track  ?  Timeframe:  Long-Range Goal ?Priority:  High ?Start Date: 06/18/21                            ?Expected End Date: 12/17/20                     ? ?Follow Up Date 10/19/20  ?  ?- check blood pressure 3 times per week ?- choose a place to take my blood pressure (home, clinic or office, retail store) ?- write blood pressure results in a log or diary  ?  ?Why is this important?   ?You won't feel high blood pressure, but it can still hurt your blood vessels.  ?High blood pressure can cause heart or kidney problems. It can also cause a stroke.  ?Making lifestyle changes like losing a little weight or eating less salt will help.  ?Checking your blood pressure at home and at different times of the day can help to control blood pressure.  ?If the doctor prescribes medicine remember to take it the way the doctor ordered.  ?Call the office if you cannot afford the medicine or if there are questions about it.   ?  ?Notes:  ?  ? ?  ? ?Patient Care Plan: General Pharmacy (Adult)  ?  ? ?Problem Identified: HTN, HLD, Afib, Osteoporosis   ?Priority: High  ?Onset Date: 06/18/2021  ?  ? ?Long-Range Goal: Patient-Specific Goal   ?Start Date: 06/18/2021  ?Expected End Date: 12/17/2021  ?Recent Progress: On track  ?Priority: High  ?Note:   ?Current Barriers:  ?Unable to independently afford treatment regimen ?Unable to achieve control of BP  ? ?Pharmacist Clinical Goal(s):  ?Patient will verbalize ability to afford treatment regimen ?achieve adherence to monitoring guidelines and medication adherence to achieve therapeutic efficacy ?achieve improvement in BP as evidenced by monitoring through collaboration with PharmD and provider.  ? ?Interventions: ?1:1 collaboration with [name], MD regarding development and update of comprehensive plan of care as evidenced by provider attestation and  co-signature ?Inter-disciplinary care team collaboration (see longitudinal plan of care) ?Comprehensive medication review performed; medication list updated in electronic medical record ? ?Hypertension (BP goal <140/90) ?-Not ideally controlled ?-Current treatment: ?Amlodipine '5mg'$  daily Appropriate, Effective, Safe, Accessible ?Clonidine 0.'1mg'$  BID  Appropriate, Effective, Safe, Accessible ?Losartan '100mg'$  daily  Appropriate, Effective, Safe, Accessible ?Spironolactone '25mg'$  daily  Appropriate, Effective, Safe, Accessible ?-Medications previously tried: metoprolol, valsartan  ?-Current home readings: 182/73 P 62, 165/62 P 61 ?-Current exercise habits: minimal due to having to take care of her husband ?-Denies hypotensive/hypertensive symptoms ?-Educated on BP goals and benefits of medications for prevention of heart attack, stroke and kidney damage; ?Daily salt intake goal < 2300 mg; ?Exercise goal of 150 minutes per week; ?Importance of home blood pressure monitoring; ?-Counseled to monitor BP at home weekly, document, and provide log at future appointments ?-Recommended to continue current medication ?Have asked patient to monitor and record, will f/u on BP readings next month ? ?Update 12/24/21 ?BP elevated last time in the office.  The two times before that her BP was a little bit on the lower side.  No monitoring much at home. ?If BP remains elevated could consider increasing amlodipine to '10mg'$  daily. ?At this  time would make not changes just continue to monitor. ? ?Hyperlipidemia/CAD: (LDL goal < 70) ?-Controlled ?-Current treatment: ?None ?-Medications previously tried: none noted  ?-LDL is normal at last lipid panel ?-Educated on Cholesterol goals;  ?Importance of limiting foods high in cholesterol; ?Exercise goal of 150 minutes per week; ?-Recommended to continue current medication ? ?Atrial Fibrillation (Goal: prevent stroke and major bleeding) ?-Controlled ?-CHADSVASC: 8 ?-Current treatment: ?Rate control:  none ?Anticoagulation: Eliquis '5mg'$  BID Appropriate, Effective, Safe, Accessible ?-Medications previously tried: metoprolol ?-Home BP and HR readings: HR is well controlled  ?-Counseled on increased risk of stroke due to Afib and benefits of anticoagulation for stroke prevention; ?bleeding risk associated with Eliquis and importance of self-monitoring for signs/symptoms of bleeding; ?seeking medical attention after a head injury or if there is blood in the urine/stool; ?-Hgb continues to be low, however based on her weight and Scr she is on correct dose of Eliquis.  Will continue to monitor for need to decrease dose. ?-Recommended to continue current medication ?Consider dose decrease should weight be < 60kg or Scr be > 1.5. ? ?Update 12/24/21 ?Eliquis dose still appropriate based on most recent Scr.  Her weight remains above level to decrease dose as well. ?No changes at this time, continue to monitor renal function for need to adjust dose. ?Patient followed regularly by cardiology. ? ? ?Osteoporosis / Osteopenia (Goal Prevent fractures/maintain bone density) ?-Not ideally controlled ?-Last DEXA Scan: 02/04/21  ? T-Score femoral neck: -3.0 ? T-Score lumbar spine: -3.0 ?-Patient is a candidate for pharmacologic treatment due to T-Score < -2.5 in lumbar spine ?-Current treatment  ?None ?-Medications previously tried: Fosamax (throat irritation), Prolia (unclear if she started due to $$)  ?-Recommend [phone] units of vitamin D daily. Recommend 1200 mg of calcium daily from dietary and supplemental sources. Recommend weight-bearing and muscle strengthening exercises for building and maintaining bone density. ?-Patient is currently having some jaw pain.  She took one dose of Fosamax and her jaw pain started about 4 weeks later.  Advised her to see a dentist to see if she can get X-rays of the area her pain is.  This could also be contributing to her increase in BP.  Would recommend resolving jaw pain before discussing  treatment options for osteoporosis.  Patient agreeable to this. ? ? ?Patient Goals/Self-Care Activities ?Patient will:  ?- take medications as prescribed ?focus on medication adherence by pill box ?collaborate with provider on medication access solutions ? ?Follow Up Plan: The care management team will reach out to the patient again over the next 180 days.  ? ?  ? ?  ?  ? ?The patient verbalized understanding of instructions, educational materials, and care plan provided today and declined offer to receive copy of patient instructions, educational materials, and care plan.  ?Telephone follow up appointment with pharmacy team member scheduled for: 6 months ? ?Edythe Clarity, [facility]  ?Beverly Milch, PharmD, CPP ?Clinical Pharmacist Practitioner ?[place] ?([phone] ? ?

## 2021-12-25 ENCOUNTER — Other Ambulatory Visit: Payer: Self-pay | Admitting: Cardiology

## 2021-12-28 ENCOUNTER — Ambulatory Visit: Payer: Medicare Other | Admitting: Podiatry

## 2021-12-28 DIAGNOSIS — M79676 Pain in unspecified toe(s): Secondary | ICD-10-CM | POA: Diagnosis not present

## 2021-12-28 DIAGNOSIS — L97511 Non-pressure chronic ulcer of other part of right foot limited to breakdown of skin: Secondary | ICD-10-CM | POA: Diagnosis not present

## 2021-12-28 DIAGNOSIS — B351 Tinea unguium: Secondary | ICD-10-CM | POA: Diagnosis not present

## 2021-12-28 NOTE — Progress Notes (Signed)
She presents today for follow-up of her ulceration to the distal aspect of the toe states that is doing much better she is very happy with the outcome. ? ?Objective: Vital signs stable alert oriented x3 there is no erythema edema cellulitis drainage odor appears to be have gone on to heal uneventfully. ? ?Assessment: Well-healing ulceration. ? ?Plan: Recommend that she continue to wear the [place] shoe for the next few weeks should she have any recurrence when she gets in a closed toed shoe she is to let me know immediately. ?

## 2021-12-30 NOTE — Progress Notes (Signed)
Patient ID: [name], female   DOB: 03-11-1935, 87 y.o.   MRN: [id number] ? ?  ?Advanced Heart Failure Clinic Note  ? ?PCP: [name], MD ?Cardiology: [name] ?HF Cardiology: Aundra Dubin ? ?87 y.o. with history of poorly controlled HTN, chronic atrial fibrillation, chronic diastolic CHF, and prior episode of Takotsubo cardiomyopathy was referred by to HF clinic by Dr. [name]. Patient has had exertional dyspnea since 11/16.  At that time, she presented to the hospital with chest pain.  Coronary angiography showed no significant CAD.  EF was 35-40% by echo, suspected Takotsubo cardiomyopathy.  Her echo in 3/17 showed EF improved to 26-83% but PA systolic pressure elevated at 65 mmHg.  We transitioned her to torsemide.  ? ?RHC/LHC in 4/22 showed nonobstructive CAD; mean RA 5, PA 44/10, mean PCWP 20, CI 4.89, PVR < 1 WU.  Echo in 8/22 showed EF up to 65-70%, mild LVH, normal RV, PASP 67, moderate RAE, trivial MR. PYP scan in 9/22 was probably negative.  ? ?Follow up 12/01/21, she was volume overloaded, weight up 14 lbs and ReDs 46%. Torsemide increased to 80/40.  ? ?Today she returns for HF follow up. Overall feeling fine. Main complaint in LE swelling. She has recently seen by podiatry for cellulitis on toe and completed abx. No significant dyspnea walking short distances on flat ground. Denies palpitations, abnormal bleeding,dizziness, or PND/Orthopnea. Appetite ok. No fever or chills. Weight at home 170 pounds. Taking all medications. She still gets occasional atypical chest pain, will take NTG with resolution. No change to this pattern.  Feels torsemide is less effective as it has been in past. ? ?REDS clip 37% ? ?Labs (11/16): LDL 80 ?Labs (4/17): K 4.5, creatinine 1.1, BNP 132 ?Labs (5/17): LDL 92 ?Labs (3/18): K 4, creatinine 0.98, LDL 81, proBNP 834 ?Labs (6/18): K 4.1, creatinine 1.37, hgb 12.5 ?Labs (10/21): LDL 79, HDL 71 ?Labs (5/22): myeloma panel and urine immunofixation negative ?Labs (7/22): K 4.4,  creatinine 1.19 ?Labs (2/23): K 4.5, creatinine 1.37, hgb 9.7 ?Labs (4/23): K 4.9, creatinine 1.4, LDL 52, HDL 28 ? ?PMH: ?1. Hyperlipidemia: Myalgias with atorvastatin, Crestor, simvastatin. Myalgias with Zetia.  ?2. Chronic atrial fibrillation ?3. HTN: Poor control.  ?4. H/o breast cancer: 2013.  ?5. GERD ?6. Takotsubo cardiomyopathy: Admitted in 11/16 with chest pain.  Coronary angiography showed nonobstructive CAD.  Echo (11/16) showed EF 35-40%.  EF back to normal by 3/17 echo.  ?7. Chronic diastolic CHF ?- Echo (4/19) with EF 60-65%, mild aortic stenosis, PA systolic pressure 65 mmHg, RV mildly dilated with normal systolic function.  ?- Cardiolite (4/17) with EF 61%, small reversible mid anteroseptal/apical lateral defects thought to be related to shifting breast artifact => low risk study.  ?- Echo (12/21): EF 65-70%, normal RV, PASP 60 mmHg, mild AS, mild-moderate TR.   ?- RHC/LHC in 4/22 showed nonobstructive CAD; mean RA 5, PA 44/10, mean PCWP 20, CI 4.89, PVR < 1 WU.   ?- Echo in 8/22 showed EF up to 65-70%, mild LVH, normal RV, PASP 67, moderate RAE, trivial MR.  ?- PYP scan (9/22): H/CL < 1.5, grade 1.  Probably negative.   ?8. Ascending aortic aneurysm: 4.3 cm by CTA in 11/16.  ?9. Pulmonary hypertension: PASP 65 mmHg by echo in 3/17.  CTA chest in 11/16 with no PE.  PFTs in 7/15 with mild obstructive lung disease.  ?10. Sleep study negative 2017. ?11. Bradycardia: off nodal blockers.  ?- Holter (8/20): Average HR 51, afib ?  12. Aortic stenosis: Mild.  ? ?Social History  ? ?Socioeconomic History  ? Marital status: Married  ?  Spouse name: Not on file  ? Number of children: Not on file  ? Years of education: Not on file  ? Highest education level: Not on file  ?Occupational History  ? Not on file  ?Tobacco Use  ? Smoking status: Never  ? Smokeless tobacco: Never  ?Vaping Use  ? Vaping Use: Never used  ?Substance and Sexual Activity  ? Alcohol use: No  ? Drug use: No  ? Sexual activity: Not Currently   ?Other Topics Concern  ? Not on file  ?Social History Narrative  ? Not on file  ? ?Social Determinants of Health  ? ?Financial Resource Strain: Not on file  ?Food Insecurity: Not on file  ?Transportation Needs: Not on file  ?Physical Activity: Not on file  ?Stress: Not on file  ?Social Connections: Not on file  ?Intimate Partner Violence: Not on file  ? ?Family History  ?Problem Relation Age of Onset  ? Heart disease Mother   ? Heart attack Mother   ? Cancer Sister   ?     stomach and colon  ? Heart disease Brother 69  ? Hypertension Father   ? Cancer Sister   ? Stroke Neg Hx   ? ?ROS: All systems reviewed and negative except as per HPI.  ? ?Current Outpatient Medications  ?Medication Sig Dispense Refill  ? acetaminophen (TYLENOL) 500 MG tablet Take 500-1,000 mg by mouth every 6 (six) hours as needed for headache (pain).    ? amLODipine (NORVASC) 5 MG tablet Take 2 tablets by mouth once daily 135 tablet 0  ? apixaban (ELIQUIS) 5 MG TABS tablet Take 1 tablet (5 mg total) by mouth 2 (two) times daily. 60 tablet 6  ? Ascorbic Acid (VITAMIN C) 1000 MG tablet Take 1,000 mg by mouth once a week.    ? Azelastine HCl 137 MCG/SPRAY SOLN Place 1 spray into both nostrils 2 (two) times daily. As needed    ? Cholecalciferol (VITAMIN D3) 25 MCG (1000 UT) CAPS Take 1,000 Units by mouth once a week.    ? clobetasol ointment (TEMOVATE) 0.05 % clobetasol 0.05 % topical ointment ? APPLY TO THE AFFECTED AREAS TWICE DAILY WHILE FLARED FOR TWO WEEKS. THEN USE ONCE OR TWICE WEEKLY AS MAINTENANCE    ? clonazePAM (KLONOPIN) 0.5 MG tablet Take 1 tablet by mouth twice daily as needed for anxiety 60 tablet 0  ? cloNIDine (CATAPRES) 0.1 MG tablet Take 1 tablet (0.1 mg total) by mouth 2 (two) times daily. 180 tablet 2  ? clotrimazole-betamethasone (LOTRISONE) cream Apply 1 application topically as needed.    ? famotidine (PEPCID) 20 MG tablet Take 20 mg by mouth at bedtime. As needed    ? ferrous sulfate 325 (65 FE) MG tablet Take 1 tablet (325  mg total) by mouth daily with breakfast. 90 tablet 3  ? fluticasone (FLONASE) 50 MCG/ACT nasal spray Place 1 spray into both nostrils as needed for allergies or rhinitis.    ? losartan (COZAAR) 100 MG tablet Take 1 tablet by mouth once daily 90 tablet 0  ? nitroGLYCERIN (NITROSTAT) 0.4 MG SL tablet DISSOLVE ONE TABLET UNDER THE TONGUE EVERY 5 MINUTES AS NEEDED FOR CHEST PAIN.  DO NOT EXCEED A TOTAL OF 3 DOSES IN 15 MINUTES 25 tablet 8  ? spironolactone (ALDACTONE) 25 MG tablet Take 1 tablet by mouth once daily 90 tablet 0  ?  torsemide (DEMADEX) 20 MG tablet Take 4 tablets (80 mg total) by mouth every morning AND 2 tablets (40 mg total) every evening. 180 tablet 11  ? ?No current facility-administered medications for this encounter.  ? ?BP (!) 130/56   Pulse (!) 57   Wt 77.3 kg (170 lb 6.4 oz)   SpO2 96%   BMI 30.19 kg/m?   ? ?Wt Readings from Last 3 Encounters:  ?01/01/22 77.3 kg (170 lb 6.4 oz)  ?12/01/21 80.5 kg (177 lb 6.4 oz)  ?10/08/21 78.5 kg (173 lb)  ?  ?Physical Exam: ?General:  NAD. No resp difficulty, walked into clinic ?HEENT: Normal ?Neck: Supple. JVP 6-7. Carotids 2+ bilat; no bruits. No lymphadenopathy or thryomegaly appreciated. ?Cor: PMI nondisplaced. Irregular rate & rhythm. No rubs, gallops, 2/6 SEM RUSB ?Lungs: Clear ?Abdomen: Soft, nontender, nondistended. No hepatosplenomegaly. No bruits or masses. Good bowel sounds. ?Extremities: No cyanosis, clubbing, rash, 1+ ankle edema L>R, R knee brace on ?Neuro: Alert & oriented x 3, cranial nerves grossly intact. Moves all 4 extremities w/o difficulty. Affect pleasant. ? ?Assessment/Plan: ?1. Chronic atrial fibrillation: She is off nodal blockade with HR 57 today.  ?- Continue Eliquis 5 mg bid.    ?2. HTN: BP controlled.   ?3. Chronic diastolic CHF: NYHA class III symptoms, slowly progressive over time.  Mild volume overload on exam.  RHC/LHC in 4/22 showed nonobstructive CAD; mean RA 5, PA 44/10, mean PCWP 20, CI 4.89, PVR < 1 WU (pulmonary venous  hypertension).  Echo in 8/22 showed EF up to 65-70%, mild LVH, normal RV, PASP 67, moderate RAE, trivial MR. PYP scan in 9/22 was probably negative. She remains at least mildly volume overloaded today by exam, Re

## 2022-01-01 ENCOUNTER — Encounter (HOSPITAL_COMMUNITY): Payer: Self-pay

## 2022-01-01 ENCOUNTER — Ambulatory Visit (HOSPITAL_COMMUNITY)
Admission: RE | Admit: 2022-01-01 | Discharge: 2022-01-01 | Disposition: A | Discharge status: Home / Self Care | Payer: Medicare Other | Source: Ambulatory Visit | Attending: Family Medicine | Admitting: Family Medicine

## 2022-01-01 VITALS — BP 130/56 | HR 57 | Wt 170.4 lb

## 2022-01-01 DIAGNOSIS — I110 Hypertensive heart disease with heart failure: Secondary | ICD-10-CM | POA: Diagnosis not present

## 2022-01-01 DIAGNOSIS — I482 Chronic atrial fibrillation, unspecified: Secondary | ICD-10-CM | POA: Diagnosis not present

## 2022-01-01 DIAGNOSIS — Z79899 Other long term (current) drug therapy: Secondary | ICD-10-CM | POA: Insufficient documentation

## 2022-01-01 DIAGNOSIS — R0789 Other chest pain: Secondary | ICD-10-CM | POA: Diagnosis not present

## 2022-01-01 DIAGNOSIS — R0600 Dyspnea, unspecified: Secondary | ICD-10-CM | POA: Insufficient documentation

## 2022-01-01 DIAGNOSIS — Z87440 Personal history of urinary (tract) infections: Secondary | ICD-10-CM | POA: Insufficient documentation

## 2022-01-01 DIAGNOSIS — I4821 Permanent atrial fibrillation: Secondary | ICD-10-CM | POA: Diagnosis not present

## 2022-01-01 DIAGNOSIS — I10 Essential (primary) hypertension: Secondary | ICD-10-CM

## 2022-01-01 DIAGNOSIS — I272 Pulmonary hypertension, unspecified: Secondary | ICD-10-CM | POA: Insufficient documentation

## 2022-01-01 DIAGNOSIS — I2510 Atherosclerotic heart disease of native coronary artery without angina pectoris: Secondary | ICD-10-CM | POA: Diagnosis not present

## 2022-01-01 DIAGNOSIS — Z7901 Long term (current) use of anticoagulants: Secondary | ICD-10-CM | POA: Insufficient documentation

## 2022-01-01 DIAGNOSIS — I5032 Chronic diastolic (congestive) heart failure: Secondary | ICD-10-CM | POA: Insufficient documentation

## 2022-01-01 LAB — BASIC METABOLIC PANEL
Anion gap: 8 (ref 5–15)
BUN: 60 mg/dL — ABNORMAL HIGH (ref 8–23)
CO2: 28 mmol/L (ref 22–32)
Calcium: 9.3 mg/dL (ref 8.9–10.3)
Chloride: 99 mmol/L (ref 98–111)
Creatinine, Ser: 1.51 mg/dL — ABNORMAL HIGH (ref 0.44–1.00)
GFR, Estimated: 34 mL/min — ABNORMAL LOW (ref >60)
Glucose, Bld: 113 mg/dL — ABNORMAL HIGH (ref 70–99)
Potassium: 4.4 mmol/L (ref 3.5–5.1)
Sodium: 135 mmol/L (ref 135–145)

## 2022-01-01 LAB — BRAIN NATRIURETIC PEPTIDE: B Natriuretic Peptide: 77.3 pg/mL (ref 0.0–100.0)

## 2022-01-01 MED ORDER — TORSEMIDE 20 MG PO TABS: 80.0000 mg | ORAL_TABLET | Freq: Two times a day (BID) | ORAL | 11 refills | Status: DC

## 2022-01-01 MED ORDER — AMLODIPINE BESYLATE 5 MG PO TABS: 5.0000 mg | ORAL_TABLET | Freq: Every day | ORAL | 3 refills | Status: DC

## 2022-01-01 NOTE — Progress Notes (Signed)
Medication Samples have been provided to the patient. ? ?Drug name: eliquis       Strength: '5mg'$         Qty: 2 boxes  LOT: [id number]  Exp.Date: 02/25 ? ?Dosing instructions: take 1 tablet Twice daily ? ? ?The patient has been instructed regarding the correct time, dose, and frequency of taking this medication, including desired effects and most common side effects.  ? ?Abriella Filkins M Flem Enderle ?2:43 PM ?01/01/2022 ? ?

## 2022-01-01 NOTE — Patient Instructions (Signed)
Medication Changes: ? ?Change Torsemide to '80mg'$  Twice daily ? ?Decrease Amlodipine to '5mg'$  daily ? ?Lab Work: ? ?Labs done today, your results will be available in MyChart, we will contact you for abnormal readings. ? ? ?Testing/Procedures: ? ?Repeat blood work in 10 days ? ?Referrals: ? ?none ? ?Special Instructions // Education: ? ?none ? ?Follow-Up in: as scheduled  ? ?At the [facility], you and your health needs are our priority. We have a designated team specialized in the treatment of Heart Failure. This Care Team includes your primary Heart Failure Specialized Cardiologist (physician), Advanced Practice Providers (APPs- Physician Assistants and Nurse Practitioners), and Pharmacist who all work together to provide you with the care you need, when you need it.  ? ?You Apel see any of the following providers on your designated Care Team at your next follow up: ? ?Dr Glori Bickers ?Dr Loralie Champagne ?Darrick Grinder, NP ?[name], PA ?Jessica Milford,NP ?Marlyce Huge, PA ?Audry Riles, PharmD ? ? ?Please be sure to bring in all your medications bottles to every appointment.  ? ?Need to Contact us: ? ?If you have any questions or concerns before your next appointment please send [place] a message through [place] or call our office at [phone].   ? ?TO LEAVE A MESSAGE FOR THE NURSE SELECT OPTION 2, PLEASE LEAVE A MESSAGE INCLUDING: ?YOUR NAME ?DATE OF BIRTH ?CALL BACK NUMBER ?REASON FOR CALL**this is important as we prioritize the call backs ? ?YOU WILL RECEIVE A CALL BACK THE SAME DAY AS LONG AS YOU CALL BEFORE 4:00 PM ? ? ?

## 2022-01-01 NOTE — Progress Notes (Signed)
ReDS Vest / Clip - 01/01/22 1400   ? ?  ? ReDS Vest / Clip  ? Station Marker A   ? Ruler Value 27   ? ReDS Value Range Moderate volume overload   ? ReDS Actual Value 37   ? ?  ?  ? ?  ? ? ?

## 2022-01-01 NOTE — Addendum Note (Signed)
Encounter addended by: Jerl Mina, RN on: 01/01/2022 2:44 PM ? Actions taken: Clinical Note Signed

## 2022-01-03 DIAGNOSIS — I480 Paroxysmal atrial fibrillation: Secondary | ICD-10-CM

## 2022-01-03 DIAGNOSIS — I10 Essential (primary) hypertension: Secondary | ICD-10-CM

## 2022-01-08 ENCOUNTER — Ambulatory Visit (INDEPENDENT_AMBULATORY_CARE_PROVIDER_SITE_OTHER): Payer: Medicare Other | Admitting: Family Medicine

## 2022-01-08 VITALS — BP 128/52 | HR 53 | Temp 97.5°F | Ht 63.0 in | Wt 172.8 lb

## 2022-01-08 DIAGNOSIS — I5032 Chronic diastolic (congestive) heart failure: Secondary | ICD-10-CM | POA: Diagnosis not present

## 2022-01-08 NOTE — Progress Notes (Signed)
? ?Subjective:  ? ? Patient ID: [name], female    DOB: [birth year], 87 y.o.   MRN: [id number] ? ?Patient presents today complaining of pain in her right great toe.  She recently saw podiatry who removed her toenail per her report due to onychomycosis.  Reviewing the records, they were treating the patient for an ulceration on the tip of her toe.  This is completely healed.  There is no residual skin breakdown.  There is a recurrent toenail growing that is thick and yellow.  However there is no evidence of any pain or discomfort due to the toenail.  Therefore I recommended against treatment for removing the toenail at the end with the patient's concern.  I believe this would just give the patient another wound of the be slow but we will.  She is only been taking 7 of her torsemide daily instead of 8.  He is supposed to be taking 4 pills twice daily.  Instead she takes 4 in the morning and 3 at night.  She declines wearing her compression hose due to the fact she believes it makes her toes swell.  However she denies any shortness of breath.  She does report some postnasal drip and mucus in her airways that Mucinex is not clearing up.  She has not been taking her allergy medicine cetirizine. ?Past Medical History:  ?Diagnosis Date  ? Allergy   ? rhinitis  ? Aortic stenosis   ? mild by echo 06/2017  ? Arthritis   ? Bradycardia   ? a. 10/2017 -> beta blocker cut back due to HR 39.  ? Breast cancer ([place]) 01/06/2012  ? Cancer [facility])   ? right colon and left breast  ? Chronic diastolic CHF (congestive heart failure) ([place])   ? Colon cancer ([place]) 01/06/2012  ? Colovesical fistula   ? Dr. Marlou Starks and [name] planning surgery 08/2018- surgery revealed spontaneous closure  ? COPD (chronic obstructive pulmonary disease) ([place])   ? pt. denies  ? Coronary artery disease 2006  ? a.  NSTEMI in 2016, cath showed 15% prox-mid RCA, 20% prox LAD, EF 25-35% by cath and 35-40% -> felt due to Takotsubo cardiomyopathy.  ? Dilated aortic root ([place])    ? 32mHg by echo 06/2017  ? Diverticulosis   ? Dyspnea   ? Edema extremities   ? GERD (gastroesophageal reflux disease)   ? Hernia   ? Hiatal hernia   ? denies  ? Hyperlipidemia   ? Hypertension   ? Mild aortic stenosis   ? echo 11/2015 but not noted on echo 06/2016  ? Osteopenia   ? [place]   ? chronic atrial fibrillation  ? Pneumonia   ? hx child  ? [place]   ? a. moderate to severe PASP 643mg echo 11/2015 - now 4141m by echo 06/2017. CTA chest in 11/16 with no PE. PFTs in 7/15 with mild obstructive lung disease. She had a negative sleep study in 2017. b. Felt due to left sided HF.  ? [facility] Takotsubo syndrome 07/29/2015  ? a. EF 35-40% by echo; akinesis of mid-apical anteroseptal and apical myocardium.  EF now normalized on echo 11/2015  ? ?Past Surgical History:  ?Procedure Laterality Date  ? APPENDECTOMY    ? BREAST SURGERY    ? lumpectomy left  ? CARDIAC CATHETERIZATION    ? CARDIAC CATHETERIZATION N/A 07/28/2015  ? Procedure: Left Heart Cath and Coronary Angiography;  Surgeon:  Peter M Martinique, MD;  Location: [facility];  Service: Cardiovascular;  Laterality: N/A;  ? CHOLECYSTECTOMY    ? COLECTOMY    ? right side  ? CYSTOSCOPY WITH STENT PLACEMENT Left 09/04/2018  ? Procedure: CYSTOSCOPY WITH LEFT STENT PLACEMENT, BLADDER REPAIR, CYSTOSCOPY WITH LEFT STENT REMOVAL;  Surgeon: Raynelle Bring, MD;  Location: [facility];  Service: Urology;  Laterality: Left;  ? EXCISION OF ACCESSORY NIPPLE Bilateral 05/30/2013  ? Procedure: BILATERAL NIPPLE BIOPSY;  Surgeon: Merrie Roof, MD;  Location: [facility];  Service: General;  Laterality: Bilateral;  ? EYE SURGERY Bilateral 12  ? cataracts  ? HYSTEROSCOPY WITH D & C N/A 05/29/2020  ? Procedure: DILATATION AND CURETTAGE /HYSTEROSCOPY;  Surgeon: Homero Fellers, MD;  Location: [facility];  Service: Gynecology;  Laterality: N/A;  ? IR RADIOLOGIST EVAL & MGMT  07/26/2017  ? LAPAROSCOPIC RIGHT COLECTOMY N/A 09/04/2018  ?  Procedure: LAPAROSCOPIC ASSISTED SIGMOID COLECTOMY WITH REPAIR OF FISTULA TO BLADDER;  Surgeon: Jovita Kussmaul, MD;  Location: [facility];  Service: General;  Laterality: N/A;  ? RIGHT/LEFT HEART CATH AND CORONARY ANGIOGRAPHY N/A 12/10/2020  ? Procedure: RIGHT/LEFT HEART CATH AND CORONARY ANGIOGRAPHY;  Surgeon: Larey Dresser, MD;  Location: [facility];  Service: Cardiovascular;  Laterality: N/A;  ? SPLIT NIGHT STUDY  02/02/2016  ?    ? ?Current Outpatient Medications on File Prior to Visit  ?Medication Sig Dispense Refill  ? acetaminophen (TYLENOL) 500 MG tablet Take 500-1,000 mg by mouth every 6 (six) hours as needed for headache (pain).    ? amLODipine (NORVASC) 5 MG tablet Take 1 tablet (5 mg total) by mouth daily. 90 tablet 3  ? apixaban (ELIQUIS) 5 MG TABS tablet Take 1 tablet (5 mg total) by mouth 2 (two) times daily. 60 tablet 6  ? Ascorbic Acid (VITAMIN C) 1000 MG tablet Take 1,000 mg by mouth once a week.    ? Azelastine HCl 137 MCG/SPRAY SOLN Place 1 spray into both nostrils 2 (two) times daily. As needed    ? Cholecalciferol (VITAMIN D3) 25 MCG (1000 UT) CAPS Take 1,000 Units by mouth once a week.    ? clonazePAM (KLONOPIN) 0.5 MG tablet Take 1 tablet by mouth twice daily as needed for anxiety 60 tablet 0  ? cloNIDine (CATAPRES) 0.1 MG tablet Take 1 tablet (0.1 mg total) by mouth 2 (two) times daily. 180 tablet 2  ? clotrimazole-betamethasone (LOTRISONE) cream Apply 1 application topically as needed.    ? famotidine (PEPCID) 20 MG tablet Take 20 mg by mouth at bedtime. As needed    ? ferrous sulfate 325 (65 FE) MG tablet Take 1 tablet (325 mg total) by mouth daily with breakfast. 90 tablet 3  ? fluticasone (FLONASE) 50 MCG/ACT nasal spray Place 1 spray into both nostrils as needed for allergies or rhinitis.    ? losartan (COZAAR) 100 MG tablet Take 1 tablet by mouth once daily 90 tablet 0  ? nitroGLYCERIN (NITROSTAT) 0.4 MG SL tablet DISSOLVE ONE TABLET UNDER THE TONGUE EVERY 5 MINUTES AS NEEDED FOR  CHEST PAIN.  DO NOT EXCEED A TOTAL OF 3 DOSES IN 15 MINUTES 25 tablet 8  ? spironolactone (ALDACTONE) 25 MG tablet Take 1 tablet by mouth once daily 90 tablet 0  ? torsemide (DEMADEX) 20 MG tablet Take 4 tablets (80 mg total) by mouth 2 (two) times daily. 180 tablet 11  ? clobetasol ointment (TEMOVATE) 0.05 % clobetasol 0.05 % topical ointment ?  APPLY TO THE AFFECTED AREAS TWICE DAILY WHILE FLARED FOR TWO WEEKS. THEN USE ONCE OR TWICE WEEKLY AS MAINTENANCE (Patient not taking: Reported on 01/08/2022)    ? ?No current facility-administered medications on file prior to visit.  ? ?Allergies  ?Allergen Reactions  ? Contrast Media [Iodinated Contrast Media] Hives and Other (See Comments)  ?  Per pt strong burning sensation starting in chest radiating outward ?  ? Clonidine Derivatives Other (See Comments)  ?  Throat dry  ? Statins Other (See Comments)  ?  "bones hurt"  ? Sulfa Antibiotics Diarrhea  ?  Tremors   ? Celebrex [Celecoxib] Rash  ? Isosorbide Nitrate Itching and Rash  ? Other Itching and Rash  ?  Plastic and paper tape and heart monitor pads  ? Tape Itching and Rash  ?  Red Where applied and will spread  ? ?Social History  ? ?Socioeconomic History  ? Marital status: Married  ?  Spouse name: Not on file  ? Number of children: Not on file  ? Years of education: Not on file  ? Highest education level: Not on file  ?Occupational History  ? Not on file  ?Tobacco Use  ? Smoking status: Never  ? Smokeless tobacco: Never  ?Vaping Use  ? Vaping Use: Never used  ?Substance and Sexual Activity  ? Alcohol use: No  ? Drug use: No  ? Sexual activity: Not Currently  ?Other Topics Concern  ? Not on file  ?Social History Narrative  ? Not on file  ? ?Social Determinants of Health  ? ?Financial Resource Strain: Not on file  ?Food Insecurity: Not on file  ?Transportation Needs: Not on file  ?Physical Activity: Not on file  ?Stress: Not on file  ?Social Connections: Not on file  ?Intimate Partner Violence: Not on file   ? ? ? ? ?Review of Systems  ?Musculoskeletal:  Positive for back pain.  ?All other systems reviewed and are negative. ? ?   ?Objective:  ? Physical Exam ?Vitals reviewed.  ?Constitutional:   ?   General: She is not in

## 2022-01-12 ENCOUNTER — Other Ambulatory Visit: Payer: Self-pay | Admitting: Cardiology

## 2022-01-12 DIAGNOSIS — I5022 Chronic systolic (congestive) heart failure: Secondary | ICD-10-CM | POA: Diagnosis not present

## 2022-01-13 LAB — BASIC METABOLIC PANEL
BUN/Creatinine Ratio: 43 — ABNORMAL HIGH (ref 12–28)
BUN: 65 mg/dL — ABNORMAL HIGH (ref 8–27)
CO2: 23 mmol/L (ref 20–29)
Calcium: 9.3 mg/dL (ref 8.7–10.3)
Chloride: 94 mmol/L — ABNORMAL LOW (ref 96–106)
Creatinine, Ser: 1.5 mg/dL — ABNORMAL HIGH (ref 0.57–1.00)
Glucose: 148 mg/dL — ABNORMAL HIGH (ref 70–99)
Potassium: 4.4 mmol/L (ref 3.5–5.2)
Sodium: 137 mmol/L (ref 134–144)
eGFR: 34 mL/min/{1.73_m2} — ABNORMAL LOW (ref >59)

## 2022-01-13 LAB — SPECIMEN STATUS REPORT

## 2022-01-27 NOTE — Progress Notes (Incomplete)
Patient ID: Alisandra Son Maron, female   DOB: 1934/09/22, 87 y.o.   MRN: [id number]    Advanced Heart Failure Clinic Note   PCP: [name], MD Cardiology: [name] HF Cardiology: Aundra Dubin  87 y.o. with history of poorly controlled HTN, chronic atrial fibrillation, chronic diastolic CHF, and prior episode of Takotsubo cardiomyopathy was referred by to HF clinic by Dr. [name]. Patient has had exertional dyspnea since 11/16.  At that time, she presented to the hospital with chest pain.  Coronary angiography showed no significant CAD.  EF was 35-40% by echo, suspected Takotsubo cardiomyopathy.  Her echo in 3/17 showed EF improved to 50-53% but PA systolic pressure elevated at 65 mmHg.  We transitioned her to torsemide.   RHC/LHC in 4/22 showed nonobstructive CAD; mean RA 5, PA 44/10, mean PCWP 20, CI 4.89, PVR < 1 WU.  Echo in 8/22 showed EF up to 65-70%, mild LVH, normal RV, PASP 67, moderate RAE, trivial MR. PYP scan in 9/22 was probably negative.   Follow up 12/01/21, she was volume overloaded, weight up 14 lbs and ReDs 46%. Torsemide increased to 80/40.   Today she returns for HF follow up. Overall feeling fine. Main complaint in LE swelling. She has recently seen by podiatry for cellulitis on toe and completed abx. No significant dyspnea walking short distances on flat ground. Denies palpitations, abnormal bleeding,dizziness, or PND/Orthopnea. Appetite ok. No fever or chills. Weight at home 170 pounds. Taking all medications. She still gets occasional atypical chest pain, will take NTG with resolution. No change to this pattern.  Feels torsemide is less effective as it has been in past.  REDS clip 37%  Labs (11/16): LDL 80 Labs (4/17): K 4.5, creatinine 1.1, BNP 132 Labs (5/17): LDL 92 Labs (3/18): K 4, creatinine 0.98, LDL 81, proBNP 834 Labs (6/18): K 4.1, creatinine 1.37, hgb 12.5 Labs (10/21): LDL 79, HDL 71 Labs (5/22): myeloma panel and urine immunofixation negative Labs (7/22): K 4.4,  creatinine 1.19 Labs (2/23): K 4.5, creatinine 1.37, hgb 9.7 Labs (4/23): K 4.9, creatinine 1.4, LDL 52, HDL 28  PMH: 1. Hyperlipidemia: Myalgias with atorvastatin, Crestor, simvastatin. Myalgias with Zetia.  2. Chronic atrial fibrillation 3. HTN: Poor control.  4. H/o breast cancer: 2013.  5. GERD 6. Takotsubo cardiomyopathy: Admitted in 11/16 with chest pain.  Coronary angiography showed nonobstructive CAD.  Echo (11/16) showed EF 35-40%.  EF back to normal by 3/17 echo.  7. Chronic diastolic CHF - Echo (9/76) with EF 60-65%, mild aortic stenosis, PA systolic pressure 65 mmHg, RV mildly dilated with normal systolic function.  - Cardiolite (4/17) with EF 61%, small reversible mid anteroseptal/apical lateral defects thought to be related to shifting breast artifact => low risk study.  - Echo (12/21): EF 65-70%, normal RV, PASP 60 mmHg, mild AS, mild-moderate TR.   - RHC/LHC in 4/22 showed nonobstructive CAD; mean RA 5, PA 44/10, mean PCWP 20, CI 4.89, PVR < 1 WU.   - Echo in 8/22 showed EF up to 65-70%, mild LVH, normal RV, PASP 67, moderate RAE, trivial MR.  - PYP scan (9/22): H/CL < 1.5, grade 1.  Probably negative.   8. Ascending aortic aneurysm: 4.3 cm by CTA in 11/16.  9. Pulmonary hypertension: PASP 65 mmHg by echo in 3/17.  CTA chest in 11/16 with no PE.  PFTs in 7/15 with mild obstructive lung disease.  10. Sleep study negative 2017. 11. Bradycardia: off nodal blockers.  - Holter (8/20): Average HR 51, afib  12. Aortic stenosis: Mild.   Social History   Socioeconomic History   Marital status: Married    Spouse name: Not on file   Number of children: Not on file   Years of education: Not on file   Highest education level: Not on file  Occupational History   Not on file  Tobacco Use   Smoking status: Never   Smokeless tobacco: Never  Vaping Use   Vaping Use: Never used  Substance and Sexual Activity   Alcohol use: No   Drug use: No   Sexual activity: Not Currently   Other Topics Concern   Not on file  Social History Narrative   Not on file   Social Determinants of Health   Financial Resource Strain: Not on file  Food Insecurity: Not on file  Transportation Needs: Not on file  Physical Activity: Not on file  Stress: Not on file  Social Connections: Not on file  Intimate Partner Violence: Not on file   Family History  Problem Relation Age of Onset   Heart disease Mother    Heart attack Mother    Cancer Sister        stomach and colon   Heart disease Brother 20   Hypertension Father    Cancer Sister    Stroke Neg Hx    ROS: All systems reviewed and negative except as per HPI.   Current Outpatient Medications  Medication Sig Dispense Refill   acetaminophen (TYLENOL) 500 MG tablet Take 500-1,000 mg by mouth every 6 (six) hours as needed for headache (pain).     amLODipine (NORVASC) 5 MG tablet Take 1 tablet (5 mg total) by mouth daily. 90 tablet 3   apixaban (ELIQUIS) 5 MG TABS tablet Take 1 tablet (5 mg total) by mouth 2 (two) times daily. 60 tablet 6   Ascorbic Acid (VITAMIN C) 1000 MG tablet Take 1,000 mg by mouth once a week.     Azelastine HCl 137 MCG/SPRAY SOLN Place 1 spray into both nostrils 2 (two) times daily. As needed     Cholecalciferol (VITAMIN D3) 25 MCG (1000 UT) CAPS Take 1,000 Units by mouth once a week.     clobetasol ointment (TEMOVATE) 0.05 % clobetasol 0.05 % topical ointment  APPLY TO THE AFFECTED AREAS TWICE DAILY WHILE FLARED FOR TWO WEEKS. THEN USE ONCE OR TWICE WEEKLY AS MAINTENANCE (Patient not taking: Reported on 01/08/2022)     clonazePAM (KLONOPIN) 0.5 MG tablet Take 1 tablet by mouth twice daily as needed for anxiety 60 tablet 0   cloNIDine (CATAPRES) 0.1 MG tablet Take 1 tablet (0.1 mg total) by mouth 2 (two) times daily. 180 tablet 2   clotrimazole-betamethasone (LOTRISONE) cream Apply 1 application topically as needed.     famotidine (PEPCID) 20 MG tablet Take 20 mg by mouth at bedtime. As needed      ferrous sulfate 325 (65 FE) MG tablet Take 1 tablet (325 mg total) by mouth daily with breakfast. 90 tablet 3   fluticasone (FLONASE) 50 MCG/ACT nasal spray Place 1 spray into both nostrils as needed for allergies or rhinitis.     losartan (COZAAR) 100 MG tablet Take 1 tablet by mouth once daily 90 tablet 0   nitroGLYCERIN (NITROSTAT) 0.4 MG SL tablet DISSOLVE ONE TABLET UNDER THE TONGUE EVERY 5 MINUTES AS NEEDED FOR CHEST PAIN.  DO NOT EXCEED A TOTAL OF 3 DOSES IN 15 MINUTES 25 tablet 8   spironolactone (ALDACTONE) 25 MG tablet Take 1 tablet by  mouth once daily 90 tablet 0   torsemide (DEMADEX) 20 MG tablet Take 4 tablets (80 mg total) by mouth 2 (two) times daily. 180 tablet 11   No current facility-administered medications for this visit.   There were no vitals taken for this visit.   Wt Readings from Last 3 Encounters:  01/08/22 78.4 kg (172 lb 12.8 oz)  01/01/22 77.3 kg (170 lb 6.4 oz)  12/01/21 80.5 kg (177 lb 6.4 oz)    Physical Exam: General:  NAD. No resp difficulty, walked into clinic HEENT: Normal Neck: Supple. JVP 6-7. Carotids 2+ bilat; no bruits. No lymphadenopathy or thryomegaly appreciated. Cor: PMI nondisplaced. Irregular rate & rhythm. No rubs, gallops, 2/6 SEM RUSB Lungs: Clear Abdomen: Soft, nontender, nondistended. No hepatosplenomegaly. No bruits or masses. Good bowel sounds. Extremities: No cyanosis, clubbing, rash, 1+ ankle edema L>R, R knee brace on Neuro: Alert & oriented x 3, cranial nerves grossly intact. Moves all 4 extremities w/o difficulty. Affect pleasant.  Assessment/Plan: 1. Chronic atrial fibrillation: She is off nodal blockade with HR 57 today.  - Continue Eliquis 5 mg bid.    2. HTN: BP controlled.   3. Chronic diastolic CHF: NYHA class III symptoms, slowly progressive over time.  Mild volume overload on exam.  RHC/LHC in 4/22 showed nonobstructive CAD; mean RA 5, PA 44/10, mean PCWP 20, CI 4.89, PVR < 1 WU (pulmonary venous hypertension).  Echo in  8/22 showed EF up to 65-70%, mild LVH, normal RV, PASP 67, moderate RAE, trivial MR. PYP scan in 9/22 was probably negative. She remains at least mildly volume overloaded today by exam, ReDs 37%. NYHA class III symptoms.  - Decrease amlodipine to 5 mg daily to see if this helps with LE swelling.  - Elevate legs. - Increase torsemide to 80 mg bid. BMET/BNP today, repeat BMET in 10 days. - Unable to tolerate Jardiance due to frequent UTIs.  - No compression hose with recent foot ulcer. 4. CAD: Nonobstructive on 4/22 cath. She has been intolerant of statins and Zetia.  She has atypical chest pain that is responsive to NTG (chronic).  - Consider adding Imdur. - Good lipids (3/23)  - No ASA given apixaban use.   Follow up in 3 weeks with APP and 3-4 months with Dr. Aundra Dubin.   Maricela Bo [facility] FNP-BC 01/27/2022

## 2022-01-29 ENCOUNTER — Encounter (HOSPITAL_COMMUNITY): Payer: Medicare Other

## 2022-02-15 ENCOUNTER — Other Ambulatory Visit (HOSPITAL_COMMUNITY): Payer: Self-pay

## 2022-02-18 ENCOUNTER — Other Ambulatory Visit: Payer: Self-pay | Admitting: Family Medicine

## 2022-02-18 ENCOUNTER — Ambulatory Visit: Payer: Self-pay

## 2022-02-18 DIAGNOSIS — F064 Anxiety disorder due to known physiological condition: Secondary | ICD-10-CM

## 2022-02-18 DIAGNOSIS — K5792 Diverticulitis of intestine, part unspecified, without perforation or abscess without bleeding: Secondary | ICD-10-CM

## 2022-02-18 NOTE — Telephone Encounter (Signed)
     Chief Complaint: Chest and abdominal pain x 1-2 weeks since her husband passed away. Pain is constant - under both breast and upper abdomen. Symptoms: Tightness Frequency: 1-2 weeks ago Pertinent Negatives: Patient denies SOB or nausea Disposition: '[]'$ ED /'[]'$ Urgent Care (no appt availability in office) / '[]'$ Appointment(In office/virtual)/ '[]'$  [facility]/ '[]'$ Home Care/ '[]'$ Refused Recommended Disposition /'[]'$ [place]/ '[x]'$  Follow-up with PCP Additional Notes: Refuses ED.  Reason for Disposition  [1] Chest pain lasts > 5 minutes AND [2] occurred in past 3 days (72 hours) (Exception: feels exactly the same as previously diagnosed heartburn and has accompanying sour taste in mouth)  Answer Assessment - Initial Assessment Questions 1. LOCATION: "Where does it hurt?"       Under breast in middle 2. RADIATION: "Does the pain go anywhere else?" (e.g., into neck, jaw, arms, back)     No  3. ONSET: "When did the chest pain begin?" (Minutes, hours or days)      1-2 weeks ago 4. PATTERN "Does the pain come and go, or has it been constant since it started?"  "Does it get worse with exertion?"      Constant 5. DURATION: "How long does it last" (e.g., seconds, minutes, hours)     Hours 6. SEVERITY: "How bad is the pain?"  (e.g., Scale 1-10; mild, moderate, or severe)    - MILD (1-3): doesn't interfere with normal activities     - MODERATE (4-7): interferes with normal activities or awakens from sleep    - SEVERE (8-10): excruciating pain, unable to do any normal activities       7-8 7. CARDIAC RISK FACTORS: "Do you have any history of heart problems or risk factors for heart disease?" (e.g., angina, prior heart attack; diabetes, high blood pressure, high cholesterol, smoker, or strong family history of heart disease)     Yes 8. PULMONARY RISK FACTORS: "Do you have any history of lung disease?"  (e.g., blood clots in lung, asthma, emphysema, birth control pills)     No 9.  CAUSE: "What do you think is causing the chest pain?"     Unsure 10. OTHER SYMPTOMS: "Do you have any other symptoms?" (e.g., dizziness, nausea, vomiting, sweating, fever, difficulty breathing, cough)       No 11. PREGNANCY: "Is there any chance you are pregnant?" "When was your last menstrual period?"       No  Protocols used: Chest Pain-A-AH

## 2022-02-19 NOTE — Telephone Encounter (Signed)
11/27/21 last filled 01/08/22 ov

## 2022-02-22 ENCOUNTER — Other Ambulatory Visit (HOSPITAL_COMMUNITY): Payer: Self-pay

## 2022-02-23 ENCOUNTER — Ambulatory Visit (INDEPENDENT_AMBULATORY_CARE_PROVIDER_SITE_OTHER): Payer: Medicare Other | Admitting: Family Medicine

## 2022-02-23 VITALS — BP 142/40 | HR 58 | Temp 98.0°F | Ht 63.0 in | Wt 176.0 lb

## 2022-02-23 DIAGNOSIS — R1013 Epigastric pain: Secondary | ICD-10-CM

## 2022-02-23 MED ORDER — PANTOPRAZOLE SODIUM 40 MG PO TBEC: 40.0000 mg | DELAYED_RELEASE_TABLET | Freq: Every day | ORAL | 3 refills | Status: DC

## 2022-02-23 NOTE — Progress Notes (Signed)
Subjective:    Patient ID: [name], female    DOB: 1934-10-26, 87 y.o.   MRN: [id number]  Patient is a very sweet 87 year old Caucasian female who presents today with a 1 week history of epigastric pain.  She points to a bandlike area below the xiphoid process with her hand.  She states that it is still more.  It is tender to palpation in that area.  There is no exacerbating or alleviating factors.  She has not noticed that food makes it worse or makes it better.  She does have some indigestion but she denies any nausea or vomiting or melena.  Is difficult to determine if she is having melena because she is on iron and her stools typically dark.  She denies any constipation.  She denies any dysuria.  She has chronic shortness of breath.  However her shortness of breath is no worse than her baseline.  She denies any anginal-like symptoms.  Pain is reproducible with palpation in the epigastric area.  She is on Pepcid.  She had a right hemicolectomy due to colon cancer that was complicated by fistula afterwards.  She also has a history of an appendectomy as well as a cholecystectomy.  Therefore she is at high risk for scar tissue and adhesions in the abdomen.  Past Medical History:  Diagnosis Date   Allergy    rhinitis   Aortic stenosis    mild by echo 06/2017   Arthritis    Bradycardia    a. 10/2017 -> beta blocker cut back due to HR 39.   Breast cancer ([place]) 01/06/2012   Cancer ([place])    right colon and left breast   Chronic diastolic CHF (congestive heart failure) ([place])    Colon cancer ([place]) 01/06/2012   Colovesical fistula    Dr. Marlou Starks and [name] planning surgery 08/2018- surgery revealed spontaneous closure   COPD (chronic obstructive pulmonary disease) ([place])    pt. denies   Coronary artery disease 2006   a.  NSTEMI in 2016, cath showed 15% prox-mid RCA, 20% prox LAD, EF 25-35% by cath and 35-40% -> felt due to Takotsubo cardiomyopathy.   Dilated aortic root ([place])    30mHg by echo  06/2017   Diverticulosis    Dyspnea    Edema extremities    GERD (gastroesophageal reflux disease)    Hernia    Hiatal hernia    denies   Hyperlipidemia    Hypertension    Mild aortic stenosis    echo 11/2015 but not noted on echo 06/2016   Osteopenia    Permanent atrial fibrillation (HCC)    chronic atrial fibrillation   Pneumonia    hx child   [place]    a. moderate to severe PASP 658mg echo 11/2015 - now 4137m by echo 06/2017. CTA chest in 11/16 with no PE. PFTs in 7/15 with mild obstructive lung disease. She had a negative sleep study in 2017. b. Felt due to left sided HF.   [facility]  Takotsubo syndrome 07/29/2015   a. EF 35-40% by echo; akinesis of mid-apical anteroseptal and apical myocardium.  EF now normalized on echo 11/2015   Past Surgical History:  Procedure Laterality Date   APPENDECTOMY     BREAST SURGERY     lumpectomy left   CARDIAC CATHETERIZATION     CARDIAC CATHETERIZATION N/A 07/28/2015   Procedure: Left Heart Cath and Coronary Angiography;  Surgeon: Peter M JorMartiniqueD;  Location:  [place];  Service: Cardiovascular;  Laterality: N/A;   CHOLECYSTECTOMY     COLECTOMY     right side   CYSTOSCOPY WITH STENT PLACEMENT Left 09/04/2018   Procedure: CYSTOSCOPY WITH LEFT STENT PLACEMENT, BLADDER REPAIR, CYSTOSCOPY WITH LEFT STENT REMOVAL;  Surgeon: Raynelle Bring, MD;  [facility];  Service: Urology;  Laterality: Left;   EXCISION OF ACCESSORY NIPPLE Bilateral 05/30/2013   Procedure: BILATERAL NIPPLE BIOPSY;  Surgeon: Merrie Roof, MD;  Location: [facility];  Service: General;  Laterality: Bilateral;   EYE SURGERY Bilateral 12   cataracts   HYSTEROSCOPY WITH D & C N/A 05/29/2020   Procedure: DILATATION AND CURETTAGE /HYSTEROSCOPY;  Surgeon: Homero Fellers, MD;  Location: [facility];  Service: Gynecology;  Laterality: N/A;   IR RADIOLOGIST EVAL & MGMT  07/26/2017   LAPAROSCOPIC RIGHT COLECTOMY N/A 09/04/2018   Procedure:  LAPAROSCOPIC ASSISTED SIGMOID COLECTOMY WITH REPAIR OF FISTULA TO BLADDER;  Surgeon: Jovita Kussmaul, MD;  [facility];  Service: General;  Laterality: N/A;   RIGHT/LEFT HEART CATH AND CORONARY ANGIOGRAPHY N/A 12/10/2020   Procedure: RIGHT/LEFT HEART CATH AND CORONARY ANGIOGRAPHY;  Surgeon: Larey Dresser, MD;  Location: [place] [facility];  Service: Cardiovascular;  Laterality: N/A;   SPLIT NIGHT STUDY  02/02/2016       Current Outpatient Medications on File Prior to Visit  Medication Sig Dispense Refill   acetaminophen (TYLENOL) 500 MG tablet Take 500-1,000 mg by mouth every 6 (six) hours as needed for headache (pain).     amLODipine (NORVASC) 5 MG tablet Take 1 tablet (5 mg total) by mouth daily. 90 tablet 3   apixaban (ELIQUIS) 5 MG TABS tablet Take 1 tablet (5 mg total) by mouth 2 (two) times daily. 60 tablet 6   Ascorbic Acid (VITAMIN C) 1000 MG tablet Take 1,000 mg by mouth once a week.     Azelastine HCl 137 MCG/SPRAY SOLN Place 1 spray into both nostrils 2 (two) times daily. As needed     Cholecalciferol (VITAMIN D3) 25 MCG (1000 UT) CAPS Take 1,000 Units by mouth once a week.     clobetasol ointment (TEMOVATE) 0.05 %      clonazePAM (KLONOPIN) 0.5 MG tablet Take 1 tablet by mouth twice daily as needed for anxiety 60 tablet 0   cloNIDine (CATAPRES) 0.1 MG tablet Take 1 tablet (0.1 mg total) by mouth 2 (two) times daily. 180 tablet 2   clotrimazole-betamethasone (LOTRISONE) cream Apply 1 application topically as needed.     famotidine (PEPCID) 20 MG tablet Take 20 mg by mouth at bedtime. As needed     ferrous sulfate 325 (65 FE) MG tablet Take 1 tablet (325 mg total) by mouth daily with breakfast. 90 tablet 3   fluticasone (FLONASE) 50 MCG/ACT nasal spray Place 1 spray into both nostrils as needed for allergies or rhinitis.     losartan (COZAAR) 100 MG tablet Take 1 tablet by mouth once daily 90 tablet 0   nitroGLYCERIN (NITROSTAT) 0.4 MG SL tablet DISSOLVE ONE TABLET UNDER THE  TONGUE EVERY 5 MINUTES AS NEEDED FOR CHEST PAIN.  DO NOT EXCEED A TOTAL OF 3 DOSES IN 15 MINUTES 25 tablet 8   spironolactone (ALDACTONE) 25 MG tablet Take 1 tablet by mouth once daily 90 tablet 0   torsemide (DEMADEX) 20 MG tablet Take 4 tablets (80 mg total) by mouth 2 (two) times daily. 180 tablet 11   No current facility-administered medications on file prior  to visit.   Allergies  Allergen Reactions   Contrast Media [Iodinated Contrast Media] Hives and Other (See Comments)    Per pt strong burning sensation starting in chest radiating outward    Clonidine Derivatives Other (See Comments)    Throat dry   Statins Other (See Comments)    "bones hurt"   Sulfa Antibiotics Diarrhea    Tremors    Celebrex [Celecoxib] Rash   Isosorbide Nitrate Itching and Rash   Other Itching and Rash    Plastic and paper tape and heart monitor pads   Tape Itching and Rash    Red Where applied and will spread   Social History   Socioeconomic History   Marital status: Married    Spouse name: Not on file   Number of children: Not on file   Years of education: Not on file   Highest education level: Not on file  Occupational History   Not on file  Tobacco Use   Smoking status: Never   Smokeless tobacco: Never  Vaping Use   Vaping Use: Never used  Substance and Sexual Activity   Alcohol use: No   Drug use: No   Sexual activity: Not Currently  Other Topics Concern   Not on file  Social History Narrative   Not on file   Social Determinants of Health   Financial Resource Strain: Not on file  Food Insecurity: Not on file  Transportation Needs: Not on file  Physical Activity: Not on file  Stress: Not on file  Social Connections: Not on file  Intimate Partner Violence: Not on file      Review of Systems  Musculoskeletal:  Positive for back pain.  All other systems reviewed and are negative.      Objective:   Physical Exam Vitals reviewed.  Constitutional:      General: She is  not in acute distress.    Appearance: Normal appearance. She is well-developed and normal weight. She is not diaphoretic.  HENT:     Right Ear: Tympanic membrane and ear canal normal.     Left Ear: Tympanic membrane and ear canal normal.     Nose: Nose normal. No congestion or rhinorrhea.     Mouth/Throat:     Mouth: Mucous membranes are moist.     Pharynx: No oropharyngeal exudate or posterior oropharyngeal erythema.  Eyes:     General:        Right eye: No discharge.        Left eye: No discharge.     Conjunctiva/sclera: Conjunctivae normal.  Neck:     Vascular: No carotid bruit.  Cardiovascular:     Rate and Rhythm: Normal rate and regular rhythm.     Heart sounds: Murmur heard.     No friction rub. No gallop.  Pulmonary:     Effort: Pulmonary effort is normal. No accessory muscle usage, prolonged expiration or respiratory distress.     Breath sounds: Normal breath sounds and air entry. No stridor, decreased air movement or transmitted upper airway sounds. No decreased breath sounds, wheezing, rhonchi or rales.  Chest:     Chest wall: No deformity or swelling.  Abdominal:     General: Abdomen is flat. Bowel sounds are normal. There is no distension.     Palpations: Abdomen is not rigid.     Tenderness: There is abdominal tenderness in the epigastric area. There is no guarding or rebound.     Hernia: No hernia is present.  Musculoskeletal:     Cervical back: No rigidity or tenderness.     Right lower leg: Edema present.     Left lower leg: Edema present.  Skin:    Findings: No bruising or lesion.  Neurological:     Mental Status: She is alert. Mental status is at baseline.     Motor: No abnormal muscle tone.     Coordination: Coordination normal.     Gait: Gait normal.     Deep Tendon Reflexes: Reflexes normal.  Psychiatric:        Mood and Affect: Mood normal.        Behavior: Behavior normal.        Thought Content: Thought content normal.        Judgment:  Judgment normal.    Wt Readings from Last 3 Encounters:  02/23/22 176 lb (79.8 kg)  01/08/22 172 lb 12.8 oz (78.4 kg)  01/01/22 170 lb 6.4 oz (77.3 kg)        Assessment & Plan:  Epigastric pain - Plan: CBC with Differential/Platelet, COMPLETE METABOLIC PANEL WITH GFR, Lipase Patient has gained 6 pounds.  She has swelling in her legs.  She is only taking 7 total torsemide tablets a day.  She supposed to be on 8 tablets daily/4 in the morning and 4 in the afternoon.  I recommended that she increase back to 8 tablets daily because she has swelling in her legs and appears to be slightly fluid overloaded.  However I do not believe that this pain is cardiac.  I can reproduce the pain with palpation of the abdomen.  Differential diagnosis includes pain secondary to adhesions, peptic ulcer disease, gastritis, etc. I will check a CMP, CBC, and a lipase.  Start the patient empirically on Protonix 40 mg daily and then reassess in 1 week.  If worsening would recommend imaging of the abdomen and pelvis.  If imaging is negative, would recommend EGD.

## 2022-02-24 LAB — COMPLETE METABOLIC PANEL WITH GFR
AG Ratio: 1.4 (calc) (ref 1.0–2.5)
ALT: 8 U/L (ref 6–29)
AST: 12 U/L (ref 10–35)
Albumin: 4 g/dL (ref 3.6–5.1)
Alkaline phosphatase (APISO): 76 U/L (ref 37–153)
BUN/Creatinine Ratio: 39 (calc) — ABNORMAL HIGH (ref 6–22)
BUN: 63 mg/dL — ABNORMAL HIGH (ref 7–25)
CO2: 30 mmol/L (ref 20–32)
Calcium: 9.7 mg/dL (ref 8.6–10.4)
Chloride: 96 mmol/L — ABNORMAL LOW (ref 98–110)
Creat: 1.63 mg/dL — ABNORMAL HIGH (ref 0.60–0.95)
Globulin: 2.9 g/dL (calc) (ref 1.9–3.7)
Glucose, Bld: 96 mg/dL (ref 65–99)
Potassium: 5 mmol/L (ref 3.5–5.3)
Sodium: 134 mmol/L — ABNORMAL LOW (ref 135–146)
Total Bilirubin: 0.4 mg/dL (ref 0.2–1.2)
Total Protein: 6.9 g/dL (ref 6.1–8.1)
eGFR: 31 mL/min/{1.73_m2} — ABNORMAL LOW (ref >=60)

## 2022-02-24 LAB — CBC WITH DIFFERENTIAL/PLATELET
Absolute Monocytes: 730 cells/uL (ref 200–950)
Basophils Absolute: 20 cells/uL (ref 0–200)
Basophils Relative: 0.4 %
Eosinophils Absolute: 40 cells/uL (ref 15–500)
Eosinophils Relative: 0.8 %
HCT: 31.4 % — ABNORMAL LOW (ref 35.0–45.0)
Hemoglobin: 10.3 g/dL — ABNORMAL LOW (ref 11.7–15.5)
Lymphs Abs: 1405 cells/uL (ref 850–3900)
MCH: 31.2 pg (ref 27.0–33.0)
MCHC: 32.8 g/dL (ref 32.0–36.0)
MCV: 95.2 fL (ref 80.0–100.0)
MPV: 9.9 fL (ref 7.5–12.5)
Monocytes Relative: 14.6 %
Neutro Abs: 2805 cells/uL (ref 1500–7800)
Neutrophils Relative %: 56.1 %
Platelets: 246 10*3/uL (ref 140–400)
RBC: 3.3 10*6/uL — ABNORMAL LOW (ref 3.80–5.10)
RDW: 12.9 % (ref 11.0–15.0)
Total Lymphocyte: 28.1 %
WBC: 5 10*3/uL (ref 3.8–10.8)

## 2022-02-24 LAB — LIPASE: Lipase: 62 U/L — ABNORMAL HIGH (ref 7–60)

## 2022-03-01 ENCOUNTER — Other Ambulatory Visit (HOSPITAL_COMMUNITY): Payer: Self-pay

## 2022-03-04 ENCOUNTER — Ambulatory Visit: Payer: Self-pay

## 2022-03-04 ENCOUNTER — Other Ambulatory Visit: Payer: Self-pay | Admitting: Cardiology

## 2022-03-04 DIAGNOSIS — R234 Changes in skin texture: Secondary | ICD-10-CM | POA: Diagnosis not present

## 2022-03-04 DIAGNOSIS — S51812A Laceration without foreign body of left forearm, initial encounter: Secondary | ICD-10-CM | POA: Diagnosis not present

## 2022-03-04 NOTE — Telephone Encounter (Signed)
  Chief Complaint: Removed a 1.5 inch square for skin on left arm Symptoms: Bleeding Frequency: This morning Pertinent Negatives: Patient denies  Disposition: '[x]'$ ED /'[x]'$ Urgent Care (no appt availability in office) / '[]'$ Appointment(In office/virtual)/ '[]'$  [facility]/ '[]'$ Home Care/ '[]'$ Refused Recommended Disposition /'[]'$ [facility]/ '[]'$  Follow-up with PCP Additional Notes: PT was walking through the door and caught her arm and ripped a 1.5 inch square off of her left arm. Pt immediately bandaged the arm, but it is continuing to bleed. Pt states that it is just skin for most of it, but might be deeper in the middle.  Pt is on blood thinners. PT will go to UC for treatment.  Summary: skin tear   Pt called in stating that she just pulled a chunk of skin from her arm.      Reason for Disposition  [1] Bleeding AND [2] won't stop after 10 minutes of direct pressure (using correct technique)  Answer Assessment - Initial Assessment Questions 1. APPEARANCE of INJURY: "What does the injury look like?"      1.5 inch square piece removed 2. SIZE: "How large is the cut?"      1.5 inches 3. BLEEDING: "Is it bleeding now?" If Yes, ask: "Is it difficult to stop?"      bleeding 4. LOCATION: "Where is the injury located?"      Left arm 5. ONSET: "How long ago did the injury occur?"      Just a bit ago 6. MECHANISM: "Tell me how it happened."      On door jam 7. TETANUS: "When was the last tetanus booster?"     yes 8. PREGNANCY: "Is there any chance you are pregnant?" "When was your last menstrual period?"     na  Protocols used: Cuts and Lacerations-A-AH

## 2022-03-05 ENCOUNTER — Other Ambulatory Visit: Payer: Medicare Other

## 2022-03-05 ENCOUNTER — Ambulatory Visit (INDEPENDENT_AMBULATORY_CARE_PROVIDER_SITE_OTHER): Payer: Medicare Other | Admitting: Family Medicine

## 2022-03-05 VITALS — BP 150/48 | HR 70 | Temp 97.8°F | Ht 63.0 in | Wt 172.0 lb

## 2022-03-05 DIAGNOSIS — C182 Malignant neoplasm of ascending colon: Secondary | ICD-10-CM | POA: Diagnosis not present

## 2022-03-05 DIAGNOSIS — R899 Unspecified abnormal finding in specimens from other organs, systems and tissues: Secondary | ICD-10-CM

## 2022-03-05 DIAGNOSIS — S51012D Laceration without foreign body of left elbow, subsequent encounter: Secondary | ICD-10-CM | POA: Diagnosis not present

## 2022-03-05 DIAGNOSIS — I2510 Atherosclerotic heart disease of native coronary artery without angina pectoris: Secondary | ICD-10-CM

## 2022-03-05 DIAGNOSIS — I10 Essential (primary) hypertension: Secondary | ICD-10-CM

## 2022-03-05 NOTE — Progress Notes (Signed)
Subjective:    Patient ID: [name], female    DOB: 06-Nov-1934, 87 y.o.   MRN: [id number]  Patient is a very sweet 87 year old Caucasian female who presents today with a skin tear.  Yesterday, she lacerated the skin over the dorsal aspect of her left forearm just distal to the lateral epicondyle.  There is a V-shaped skin tear that is roughly 3 inches long.  There is no bleeding.  There is no erythema or warmth or evidence of cellulitis.  There is bruising around the skin tear.  She went to an urgent care where they dressed it using nonadherent gauze and Coban.  She is here today for dressing change.  Past Medical History:  Diagnosis Date   Allergy    rhinitis   Aortic stenosis    mild by echo 06/2017   Arthritis    Bradycardia    a. 10/2017 -> beta blocker cut back due to HR 39.   Breast cancer ([place]) 01/06/2012   Cancer ([place])    right colon and left breast   Chronic diastolic CHF (congestive heart failure) ([place])    Colon cancer ([place]) 01/06/2012   Colovesical fistula    Dr. Marlou Starks and [name] planning surgery 08/2018- surgery revealed spontaneous closure   COPD (chronic obstructive pulmonary disease) ([place])    pt. denies   Coronary artery disease 2006   a.  NSTEMI in 2016, cath showed 15% prox-mid RCA, 20% prox LAD, EF 25-35% by cath and 35-40% -> felt due to Takotsubo cardiomyopathy.   Dilated aortic root ([place])    60mHg by echo 06/2017   Diverticulosis    Dyspnea    Edema extremities    GERD (gastroesophageal reflux disease)    Hernia    Hiatal hernia    denies   Hyperlipidemia    Hypertension    Mild aortic stenosis    echo 11/2015 but not noted on echo 06/2016   Osteopenia    Permanent atrial fibrillation (HCC)    chronic atrial fibrillation   Pneumonia    hx child   [place] ([place]    a. moderate to severe PASP 673mg echo 11/2015 - now 4110m by echo 06/2017. CTA chest in 11/16 with no PE. PFTs in 7/15 with mild obstructive lung disease. She had a negative sleep  study in 2017. b. Felt due to left sided HF.   [facility]  Takotsubo syndrome 07/29/2015   a. EF 35-40% by echo; akinesis of mid-apical anteroseptal and apical myocardium.  EF now normalized on echo 11/2015   Past Surgical History:  Procedure Laterality Date   APPENDECTOMY     BREAST SURGERY     lumpectomy left   CARDIAC CATHETERIZATION     CARDIAC CATHETERIZATION N/A 07/28/2015   Procedure: Left Heart Cath and Coronary Angiography;  Surgeon: Peter M JorMartiniqueD;  Location[facility];  Service: Cardiovascular;  Laterality: N/A;   CHOLECYSTECTOMY     COLECTOMY     right side   CYSTOSCOPY WITH STENT PLACEMENT Left 09/04/2018   Procedure: CYSTOSCOPY WITH LEFT STENT PLACEMENT, BLADDER REPAIR, CYSTOSCOPY WITH LEFT STENT REMOVAL;  Surgeon: BorRaynelle BringD;  [facility];  Service: Urology;  Laterality: Left;   EXCISION OF ACCESSORY NIPPLE Bilateral 05/30/2013   Procedure: BILATERAL NIPPLE BIOPSY;  Surgeon: PauMerrie RoofD;  [facility];  Laterality: Bilateral;   EYE SURGERY Bilateral 12   cataracts   HYSTEROSCOPY WITH  D & C N/A 05/29/2020   Procedure: DILATATION AND CURETTAGE /HYSTEROSCOPY;  Surgeon: Homero Fellers, MD;  Location: [facility];  Service: Gynecology;  Laterality: N/A;   IR RADIOLOGIST EVAL & MGMT  07/26/2017   LAPAROSCOPIC RIGHT COLECTOMY N/A 09/04/2018   Procedure: LAPAROSCOPIC ASSISTED SIGMOID COLECTOMY WITH REPAIR OF FISTULA TO BLADDER;  Surgeon: Jovita Kussmaul, MD;  [facility];  Service: General;  Laterality: N/A;   RIGHT/LEFT HEART CATH AND CORONARY ANGIOGRAPHY N/A 12/10/2020   Procedure: RIGHT/LEFT HEART CATH AND CORONARY ANGIOGRAPHY;  Surgeon: Larey Dresser, MD;  Location: [place] [facility];  Service: Cardiovascular;  Laterality: N/A;   SPLIT NIGHT STUDY  02/02/2016       Current Outpatient Medications on File Prior to Visit  Medication Sig Dispense Refill   acetaminophen (TYLENOL) 500 MG tablet Take 500-1,000 mg  by mouth every 6 (six) hours as needed for headache (pain).     amLODipine (NORVASC) 5 MG tablet Take 1 tablet (5 mg total) by mouth daily. 90 tablet 3   apixaban (ELIQUIS) 5 MG TABS tablet Take 1 tablet (5 mg total) by mouth 2 (two) times daily. 60 tablet 6   Ascorbic Acid (VITAMIN C) 1000 MG tablet Take 1,000 mg by mouth once a week.     Azelastine HCl 137 MCG/SPRAY SOLN Place 1 spray into both nostrils 2 (two) times daily. As needed     Cholecalciferol (VITAMIN D3) 25 MCG (1000 UT) CAPS Take 1,000 Units by mouth once a week.     clobetasol ointment (TEMOVATE) 0.05 %      clonazePAM (KLONOPIN) 0.5 MG tablet Take 1 tablet by mouth twice daily as needed for anxiety 60 tablet 0   cloNIDine (CATAPRES) 0.1 MG tablet Take 1 tablet (0.1 mg total) by mouth 2 (two) times daily. 180 tablet 2   clotrimazole-betamethasone (LOTRISONE) cream Apply 1 application topically as needed.     famotidine (PEPCID) 20 MG tablet Take 20 mg by mouth at bedtime. As needed     ferrous sulfate 325 (65 FE) MG tablet Take 1 tablet (325 mg total) by mouth daily with breakfast. 90 tablet 3   fluticasone (FLONASE) 50 MCG/ACT nasal spray Place 1 spray into both nostrils as needed for allergies or rhinitis.     losartan (COZAAR) 100 MG tablet Take 1 tablet by mouth once daily 90 tablet 0   nitroGLYCERIN (NITROSTAT) 0.4 MG SL tablet DISSOLVE ONE TABLET UNDER THE TONGUE EVERY 5 MINUTES AS NEEDED FOR CHEST PAIN.  DO NOT EXCEED A TOTAL OF 3 DOSES IN 15 MINUTES 25 tablet 8   pantoprazole (PROTONIX) 40 MG tablet Take 1 tablet (40 mg total) by mouth daily. 30 tablet 3   spironolactone (ALDACTONE) 25 MG tablet Take 1 tablet by mouth once daily 90 tablet 0   torsemide (DEMADEX) 20 MG tablet Take 4 tablets (80 mg total) by mouth 2 (two) times daily. 180 tablet 11   No current facility-administered medications on file prior to visit.   Allergies  Allergen Reactions   Contrast Media [Iodinated Contrast Media] Hives and Other (See  Comments)    Per pt strong burning sensation starting in chest radiating outward    Clonidine Derivatives Other (See Comments)    Throat dry   Statins Other (See Comments)    "bones hurt"   Sulfa Antibiotics Diarrhea    Tremors    Celebrex [Celecoxib] Rash   Isosorbide Nitrate Itching and Rash   Other Itching and Rash  Plastic and paper tape and heart monitor pads   Tape Itching and Rash    Red Where applied and will spread   Social History   Socioeconomic History   Marital status: Married    Spouse name: Not on file   Number of children: Not on file   Years of education: Not on file   Highest education level: Not on file  Occupational History   Not on file  Tobacco Use   Smoking status: Never   Smokeless tobacco: Never  Vaping Use   Vaping Use: Never used  Substance and Sexual Activity   Alcohol use: No   Drug use: No   Sexual activity: Not Currently  Other Topics Concern   Not on file  Social History Narrative   Not on file   Social Determinants of Health   Financial Resource Strain: Not on file  Food Insecurity: Not on file  Transportation Needs: Not on file  Physical Activity: Not on file  Stress: Not on file  Social Connections: Not on file  Intimate Partner Violence: Not on file      Review of Systems  Musculoskeletal:  Positive for back pain.  All other systems reviewed and are negative.      Objective:   Physical Exam Vitals reviewed.  Constitutional:      General: She is not in acute distress.    Appearance: Normal appearance. She is well-developed and normal weight. She is not diaphoretic.  HENT:     Nose: No congestion or rhinorrhea.     Mouth/Throat:     Mouth: Mucous membranes are moist.     Pharynx: No oropharyngeal exudate or posterior oropharyngeal erythema.  Eyes:     General:        Right eye: No discharge.        Left eye: No discharge.     Conjunctiva/sclera: Conjunctivae normal.  Cardiovascular:     Rate and Rhythm:  Normal rate and regular rhythm.     Heart sounds: Murmur heard.     No friction rub. No gallop.  Pulmonary:     Effort: Pulmonary effort is normal. No accessory muscle usage, prolonged expiration or respiratory distress.     Breath sounds: Normal breath sounds and air entry. No stridor, decreased air movement or transmitted upper airway sounds. No decreased breath sounds, wheezing, rhonchi or rales.  Chest:     Chest wall: No deformity or swelling.  Abdominal:     General: Abdomen is flat. Bowel sounds are normal. There is no distension.     Palpations: Abdomen is not rigid.     Tenderness: There is abdominal tenderness in the epigastric area. There is no guarding or rebound.     Hernia: No hernia is present.    Musculoskeletal:     Left forearm: Swelling, laceration and tenderness present.       Arms:  Skin:    Findings: Bruising present. No lesion or rash.  Neurological:     Mental Status: She is alert. Mental status is at baseline.     Motor: No abnormal muscle tone.     Coordination: Coordination normal.     Gait: Gait normal.     Deep Tendon Reflexes: Reflexes normal.  Psychiatric:        Mood and Affect: Mood normal.        Behavior: Behavior normal.        Thought Content: Thought content normal.        Judgment:  Judgment normal.        Assessment & Plan:  Skin tear of left elbow without complication, subsequent encounter I will place Polysporin on nonadherent gauze and use that to cover the large skin tear that is 3 inches long.  I then wrapped it gently with an Ace wrap and explained how to perform daily dressing changes.  Reassess in 1 week if not improving and sooner if worsening.  Anticipate 10 to 14 days to heal.

## 2022-03-06 LAB — LIPASE: Lipase: 56 U/L (ref 7–60)

## 2022-03-08 DIAGNOSIS — S51812D Laceration without foreign body of left forearm, subsequent encounter: Secondary | ICD-10-CM | POA: Diagnosis not present

## 2022-03-15 ENCOUNTER — Encounter (HOSPITAL_COMMUNITY): Payer: Self-pay

## 2022-03-15 ENCOUNTER — Observation Stay (HOSPITAL_COMMUNITY)
Admission: EM | Admit: 2022-03-15 | Discharge: 2022-03-17 | Disposition: A | Discharge status: Home Health | Payer: Medicare Other | Attending: Family Medicine | Admitting: Family Medicine

## 2022-03-15 ENCOUNTER — Other Ambulatory Visit: Payer: Self-pay

## 2022-03-15 ENCOUNTER — Ambulatory Visit: Payer: Self-pay | Admitting: *Deleted

## 2022-03-15 ENCOUNTER — Emergency Department (HOSPITAL_COMMUNITY): Payer: Medicare Other

## 2022-03-15 DIAGNOSIS — R079 Chest pain, unspecified: Secondary | ICD-10-CM | POA: Diagnosis present

## 2022-03-15 DIAGNOSIS — J984 Other disorders of lung: Secondary | ICD-10-CM | POA: Diagnosis not present

## 2022-03-15 DIAGNOSIS — I4819 Other persistent atrial fibrillation: Secondary | ICD-10-CM | POA: Diagnosis not present

## 2022-03-15 DIAGNOSIS — E041 Nontoxic single thyroid nodule: Secondary | ICD-10-CM | POA: Diagnosis not present

## 2022-03-15 DIAGNOSIS — I214 Non-ST elevation (NSTEMI) myocardial infarction: Secondary | ICD-10-CM | POA: Diagnosis not present

## 2022-03-15 DIAGNOSIS — Z7901 Long term (current) use of anticoagulants: Secondary | ICD-10-CM | POA: Diagnosis not present

## 2022-03-15 DIAGNOSIS — R778 Other specified abnormalities of plasma proteins: Secondary | ICD-10-CM | POA: Insufficient documentation

## 2022-03-15 DIAGNOSIS — I2510 Atherosclerotic heart disease of native coronary artery without angina pectoris: Secondary | ICD-10-CM | POA: Diagnosis not present

## 2022-03-15 DIAGNOSIS — I4891 Unspecified atrial fibrillation: Secondary | ICD-10-CM | POA: Diagnosis not present

## 2022-03-15 DIAGNOSIS — Z79899 Other long term (current) drug therapy: Secondary | ICD-10-CM | POA: Insufficient documentation

## 2022-03-15 DIAGNOSIS — I10 Essential (primary) hypertension: Secondary | ICD-10-CM | POA: Diagnosis not present

## 2022-03-15 DIAGNOSIS — M6281 Muscle weakness (generalized): Secondary | ICD-10-CM | POA: Insufficient documentation

## 2022-03-15 DIAGNOSIS — R2681 Unsteadiness on feet: Secondary | ICD-10-CM | POA: Insufficient documentation

## 2022-03-15 DIAGNOSIS — K760 Fatty (change of) liver, not elsewhere classified: Secondary | ICD-10-CM | POA: Diagnosis not present

## 2022-03-15 DIAGNOSIS — R911 Solitary pulmonary nodule: Secondary | ICD-10-CM | POA: Diagnosis not present

## 2022-03-15 DIAGNOSIS — N1832 Chronic kidney disease, stage 3b: Secondary | ICD-10-CM | POA: Insufficient documentation

## 2022-03-15 DIAGNOSIS — E042 Nontoxic multinodular goiter: Secondary | ICD-10-CM | POA: Insufficient documentation

## 2022-03-15 DIAGNOSIS — I700 Atherosclerosis of aorta: Secondary | ICD-10-CM | POA: Diagnosis not present

## 2022-03-15 DIAGNOSIS — I5032 Chronic diastolic (congestive) heart failure: Secondary | ICD-10-CM

## 2022-03-15 DIAGNOSIS — Z8673 Personal history of transient ischemic attack (TIA), and cerebral infarction without residual deficits: Secondary | ICD-10-CM | POA: Insufficient documentation

## 2022-03-15 DIAGNOSIS — R0789 Other chest pain: Secondary | ICD-10-CM | POA: Diagnosis not present

## 2022-03-15 DIAGNOSIS — J449 Chronic obstructive pulmonary disease, unspecified: Secondary | ICD-10-CM | POA: Diagnosis not present

## 2022-03-15 DIAGNOSIS — I130 Hypertensive heart and chronic kidney disease with heart failure and stage 1 through stage 4 chronic kidney disease, or unspecified chronic kidney disease: Secondary | ICD-10-CM | POA: Diagnosis not present

## 2022-03-15 DIAGNOSIS — I272 Pulmonary hypertension, unspecified: Secondary | ICD-10-CM | POA: Diagnosis not present

## 2022-03-15 DIAGNOSIS — I959 Hypotension, unspecified: Secondary | ICD-10-CM | POA: Diagnosis not present

## 2022-03-15 LAB — CBC WITH DIFFERENTIAL/PLATELET
Abs Immature Granulocytes: 0.03 10*3/uL (ref 0.00–0.07)
Basophils Absolute: 0 10*3/uL (ref 0.0–0.1)
Basophils Relative: 0 %
Eosinophils Absolute: 0 10*3/uL (ref 0.0–0.5)
Eosinophils Relative: 0 %
HCT: 31.7 % — ABNORMAL LOW (ref 36.0–46.0)
Hemoglobin: 10.1 g/dL — ABNORMAL LOW (ref 12.0–15.0)
Immature Granulocytes: 0 %
Lymphocytes Relative: 10 %
Lymphs Abs: 1 10*3/uL (ref 0.7–4.0)
MCH: 30.8 pg (ref 26.0–34.0)
MCHC: 31.9 g/dL (ref 30.0–36.0)
MCV: 96.6 fL (ref 80.0–100.0)
Monocytes Absolute: 0.9 10*3/uL (ref 0.1–1.0)
Monocytes Relative: 9 %
Neutro Abs: 8 10*3/uL — ABNORMAL HIGH (ref 1.7–7.7)
Neutrophils Relative %: 81 %
Platelets: 240 10*3/uL (ref 150–400)
RBC: 3.28 MIL/uL — ABNORMAL LOW (ref 3.87–5.11)
RDW: 14 % (ref 11.5–15.5)
WBC: 10 10*3/uL (ref 4.0–10.5)
nRBC: 0 % (ref 0.0–0.2)

## 2022-03-15 LAB — COMPREHENSIVE METABOLIC PANEL
ALT: 11 U/L (ref 0–44)
AST: 20 U/L (ref 15–41)
Albumin: 3.7 g/dL (ref 3.5–5.0)
Alkaline Phosphatase: 68 U/L (ref 38–126)
Anion gap: 13 (ref 5–15)
BUN: 56 mg/dL — ABNORMAL HIGH (ref 8–23)
CO2: 27 mmol/L (ref 22–32)
Calcium: 9.3 mg/dL (ref 8.9–10.3)
Chloride: 96 mmol/L — ABNORMAL LOW (ref 98–111)
Creatinine, Ser: 1.36 mg/dL — ABNORMAL HIGH (ref 0.44–1.00)
GFR, Estimated: 38 mL/min — ABNORMAL LOW (ref >60)
Glucose, Bld: 181 mg/dL — ABNORMAL HIGH (ref 70–99)
Potassium: 3.5 mmol/L (ref 3.5–5.1)
Sodium: 136 mmol/L (ref 135–145)
Total Bilirubin: 0.6 mg/dL (ref 0.3–1.2)
Total Protein: 7.2 g/dL (ref 6.5–8.1)

## 2022-03-15 LAB — PROTIME-INR
INR: 1.4 — ABNORMAL HIGH (ref 0.8–1.2)
Prothrombin Time: 17.3 seconds — ABNORMAL HIGH (ref 11.4–15.2)

## 2022-03-15 LAB — MAGNESIUM: Magnesium: 2.1 mg/dL (ref 1.7–2.4)

## 2022-03-15 LAB — TROPONIN I (HIGH SENSITIVITY)
Troponin I (High Sensitivity): 143 ng/L (ref <18)
Troponin I (High Sensitivity): 165 ng/L (ref <18)
Troponin I (High Sensitivity): 184 ng/L (ref <18)
Troponin I (High Sensitivity): 182 ng/L (ref <18)

## 2022-03-15 LAB — SEDIMENTATION RATE: Sed Rate: 54 mm/hr — ABNORMAL HIGH (ref 0–22)

## 2022-03-15 LAB — BRAIN NATRIURETIC PEPTIDE: B Natriuretic Peptide: 120.6 pg/mL — ABNORMAL HIGH (ref 0.0–100.0)

## 2022-03-15 LAB — LIPASE, BLOOD: Lipase: 51 U/L (ref 11–51)

## 2022-03-15 MED ORDER — ACETAMINOPHEN 650 MG RE SUPP: 650.0000 mg | Freq: Four times a day (QID) | RECTAL | Status: DC | PRN

## 2022-03-15 MED ORDER — HEPARIN (PORCINE) 25000 UT/250ML-% IV SOLN
1200.0000 [IU]/h | INTRAVENOUS | Status: DC
  Administered 2022-03-15: 950 [IU]/h via INTRAVENOUS
  Filled 2022-03-15: qty 250

## 2022-03-15 MED ORDER — SENNOSIDES-DOCUSATE SODIUM 8.6-50 MG PO TABS
1.0000 | ORAL_TABLET | Freq: Every evening | ORAL | Status: DC | PRN
Start: 2022-03-15 — End: 2022-03-17

## 2022-03-15 MED ORDER — ONDANSETRON HCL 4 MG PO TABS: 4.0000 mg | ORAL_TABLET | Freq: Four times a day (QID) | ORAL | Status: DC | PRN

## 2022-03-15 MED ORDER — SPIRONOLACTONE 25 MG PO TABS
25.0000 mg | ORAL_TABLET | Freq: Every day | ORAL | Status: DC
  Administered 2022-03-16 – 2022-03-17 (×2): 25 mg via ORAL
  Filled 2022-03-15 (×2): qty 1

## 2022-03-15 MED ORDER — PANTOPRAZOLE SODIUM 40 MG PO TBEC
40.0000 mg | DELAYED_RELEASE_TABLET | Freq: Every day | ORAL | Status: DC
  Administered 2022-03-15 – 2022-03-17 (×3): 40 mg via ORAL
  Filled 2022-03-15 (×3): qty 1

## 2022-03-15 MED ORDER — FENTANYL CITRATE PF 50 MCG/ML IJ SOSY
25.0000 ug | PREFILLED_SYRINGE | INTRAMUSCULAR | Status: DC | PRN
  Administered 2022-03-15: 25 ug via INTRAVENOUS
  Filled 2022-03-15: qty 1

## 2022-03-15 MED ORDER — ORAL CARE MOUTH RINSE: 15.0000 mL | OROMUCOSAL | Status: DC | PRN

## 2022-03-15 MED ORDER — ONDANSETRON HCL 4 MG/2ML IJ SOLN: 4.0000 mg | Freq: Four times a day (QID) | INTRAMUSCULAR | Status: DC | PRN

## 2022-03-15 MED ORDER — METOPROLOL SUCCINATE ER 25 MG PO TB24
25.0000 mg | ORAL_TABLET | Freq: Every day | ORAL | Status: DC
  Administered 2022-03-16 – 2022-03-17 (×2): 25 mg via ORAL
  Filled 2022-03-15 (×2): qty 1

## 2022-03-15 MED ORDER — CLONAZEPAM 0.5 MG PO TABS: 0.5000 mg | ORAL_TABLET | Freq: Two times a day (BID) | ORAL | Status: DC | PRN

## 2022-03-15 MED ORDER — AMLODIPINE BESYLATE 5 MG PO TABS
5.0000 mg | ORAL_TABLET | Freq: Every day | ORAL | Status: DC
  Administered 2022-03-16 – 2022-03-17 (×2): 5 mg via ORAL
  Filled 2022-03-15 (×2): qty 1

## 2022-03-15 MED ORDER — CLONIDINE HCL 0.1 MG PO TABS
0.1000 mg | ORAL_TABLET | Freq: Two times a day (BID) | ORAL | Status: DC
  Administered 2022-03-15 – 2022-03-17 (×4): 0.1 mg via ORAL
  Filled 2022-03-15 (×4): qty 1

## 2022-03-15 MED ORDER — SODIUM CHLORIDE 0.9% FLUSH
3.0000 mL | Freq: Two times a day (BID) | INTRAVENOUS | Status: DC
  Administered 2022-03-15 – 2022-03-16 (×3): 3 mL via INTRAVENOUS

## 2022-03-15 MED ORDER — ACETAMINOPHEN 325 MG PO TABS
650.0000 mg | ORAL_TABLET | Freq: Four times a day (QID) | ORAL | Status: DC | PRN
  Administered 2022-03-16: 650 mg via ORAL
  Filled 2022-03-15: qty 2

## 2022-03-15 MED ORDER — FENTANYL CITRATE PF 50 MCG/ML IJ SOSY
25.0000 ug | PREFILLED_SYRINGE | Freq: Once | INTRAMUSCULAR | Status: AC
  Administered 2022-03-15: 25 ug via INTRAVENOUS
  Filled 2022-03-15: qty 1

## 2022-03-15 MED ORDER — LOSARTAN POTASSIUM 50 MG PO TABS
100.0000 mg | ORAL_TABLET | Freq: Every day | ORAL | Status: DC
  Administered 2022-03-16 – 2022-03-17 (×2): 100 mg via ORAL
  Filled 2022-03-15 (×2): qty 2

## 2022-03-15 MED ORDER — TORSEMIDE 20 MG PO TABS
80.0000 mg | ORAL_TABLET | Freq: Two times a day (BID) | ORAL | Status: DC
  Administered 2022-03-16 – 2022-03-17 (×3): 80 mg via ORAL
  Filled 2022-03-15 (×3): qty 4

## 2022-03-15 NOTE — Hospital Course (Signed)
[name] is a 87 y.o. female with medical history significant for persistent atrial fibrillation on Eliquis, nonobstructive CAD, history of Takotsubo cardiomyopathy with recovered EF, HFpEF (EF 65-70% by TTE 04/28/2021), CKD stage IIIb, pulmonary hypertension, COPD, hypertension who is admitted for evaluation of chest pain with elevated troponin.

## 2022-03-15 NOTE — ED Notes (Signed)
ED TO INPATIENT HANDOFF REPORT  ED Nurse Name and Phone #: Wyvonnia Lora [phone]  S Name/Age/Gender [name] 87 y.o. female Room/Bed: 014C/014C  Code Status   Code Status: Full Code  Home/SNF/Other Home Patient oriented to: self, place, time, and situation Is this baseline? Yes   Triage Complete: Triage complete  Chief Complaint Chest pain [R07.9]  Triage Note Pt BIB EMS c/o central chest pain w/ no radiation. Pt states it's a sharp pain 8/10. Patient describes pain hurts most when taking deep breaths. CP started this morning at midnight and continued with no relief after taking 1 Sl nitro. EMS gave pt 3 SL nitros on route.   VSS w/ EMS. L arm restricted.    Allergies Allergies  Allergen Reactions   Contrast Media [Iodinated Contrast Media] Hives and Other (See Comments)    Per pt strong burning sensation starting in chest radiating outward    Clonidine Derivatives Other (See Comments)    Throat dry   Statins Other (See Comments)    "bones hurt"   Sulfa Antibiotics Diarrhea    Tremors    Celebrex [Celecoxib] Rash   Isosorbide Nitrate Itching and Rash   Other Itching and Rash    Plastic and paper tape and heart monitor pads   Tape Itching and Rash    Red Where applied and will spread    Level of Care/Admitting Diagnosis ED Disposition     ED Disposition  Admit   Condition  --   Comment  Hospital Area: [facility] [[id number]]  Level of Care: Telemetry Cardiac [[id number]]  Muff place patient in observation at [facility] or [facility] if equivalent level of care is available:: No  Covid Evaluation: Asymptomatic - no recent exposure (last 10 days) testing not required  Diagnosis: Chest pain [284132]  Admitting Physician: Lenore Cordia [[id number]]  Attending Physician: Lenore Cordia [[id number]]          B Medical/Surgery History Past Medical History:  Diagnosis Date   Allergy    rhinitis   Aortic stenosis    mild by echo 06/2017   Arthritis     Bradycardia    a. 10/2017 -> beta blocker cut back due to HR 39.   Breast cancer ([place]) 01/06/2012   Cancer ([place])    right colon and left breast   Chronic diastolic CHF (congestive heart failure) ([place])    Colon cancer ([place]) 01/06/2012   Colovesical fistula    Dr. Marlou Starks and [name] planning surgery 08/2018- surgery revealed spontaneous closure   COPD (chronic obstructive pulmonary disease) ([place])    pt. denies   Coronary artery disease 2006   a.  NSTEMI in 2016, cath showed 15% prox-mid RCA, 20% prox LAD, EF 25-35% by cath and 35-40% -> felt due to Takotsubo cardiomyopathy.   Dilated aortic root ([place])    55mHg by echo 06/2017   Diverticulosis    Dyspnea    Edema extremities    GERD (gastroesophageal reflux disease)    Hernia    Hiatal hernia    denies   Hyperlipidemia    Hypertension    Mild aortic stenosis    echo 11/2015 but not noted on echo 06/2016   Osteopenia    Permanent atrial fibrillation (H[facility])    chronic atrial fibrillation   Pneumonia    hx child   Pulmonary HTN ([place]    a. moderate to severe PASP 669mg echo 11/2015 - now 4179m by echo 06/2017. CTA  chest in 11/16 with no PE. PFTs in 7/15 with mild obstructive lung disease. She had a negative sleep study in 2017. b. Felt due to left sided HF.   Stroke [facility])    Takotsubo syndrome 07/29/2015   a. EF 35-40% by echo; akinesis of mid-apical anteroseptal and apical myocardium.  EF now normalized on echo 11/2015   Past Surgical History:  Procedure Laterality Date   APPENDECTOMY     BREAST SURGERY     lumpectomy left   CARDIAC CATHETERIZATION     CARDIAC CATHETERIZATION N/A 07/28/2015   Procedure: Left Heart Cath and Coronary Angiography;  Surgeon: Peter M Martinique, MD;  Location: [facility];  Service: Cardiovascular;  Laterality: N/A;   CHOLECYSTECTOMY     COLECTOMY     right side   CYSTOSCOPY WITH STENT PLACEMENT Left 09/04/2018   Procedure: CYSTOSCOPY WITH LEFT STENT PLACEMENT, BLADDER REPAIR, CYSTOSCOPY WITH  LEFT STENT REMOVAL;  Surgeon: Raynelle Bring, MD;  Location: [facility];  Service: Urology;  Laterality: Left;   EXCISION OF ACCESSORY NIPPLE Bilateral 05/30/2013   Procedure: BILATERAL NIPPLE BIOPSY;  Surgeon: Merrie Roof, MD;  Location: [facility];  Service: General;  Laterality: Bilateral;   EYE SURGERY Bilateral 12   cataracts   HYSTEROSCOPY WITH D & C N/A 05/29/2020   Procedure: DILATATION AND CURETTAGE /HYSTEROSCOPY;  Surgeon: Homero Fellers, MD;  Location: [facility];  Service: Gynecology;  Laterality: N/A;   IR RADIOLOGIST EVAL & MGMT  07/26/2017   LAPAROSCOPIC RIGHT COLECTOMY N/A 09/04/2018   Procedure: LAPAROSCOPIC ASSISTED SIGMOID COLECTOMY WITH REPAIR OF FISTULA TO BLADDER;  Surgeon: Jovita Kussmaul, MD;  Location: [facility];  Service: General;  Laterality: N/A;   RIGHT/LEFT HEART CATH AND CORONARY ANGIOGRAPHY N/A 12/10/2020   Procedure: RIGHT/LEFT HEART CATH AND CORONARY ANGIOGRAPHY;  Surgeon: Larey Dresser, MD;  Location: [facility];  Service: Cardiovascular;  Laterality: N/A;   SPLIT NIGHT STUDY  02/02/2016         A IV Location/Drains/Wounds Patient Lines/Drains/Airways Status     Active Line/Drains/Airways     Name Placement date Placement time Site Days   Peripheral IV 12/10/20 Posterior;Right Hand 12/10/20  0956  Hand  460   Peripheral IV 03/15/22 20 G Anterior;Proximal;Right Antecubital 03/15/22  1205  Antecubital  less than 1   Incision (Closed) 05/29/20 Vagina 05/29/20  1004  -- 655            Intake/Output Last 24 hours No intake or output data in the 24 hours ending 03/15/22 1958  Labs/Imaging Results for orders placed or performed during the hospital encounter of 03/15/22 (from the past 48 hour(s))  Troponin I (High Sensitivity)     Status: Abnormal   Collection Time: 03/15/22 12:13 PM  Result Value Ref Range   Troponin I (High Sensitivity) 143 (HH) <18 ng/L    Comment: CRITICAL RESULT CALLED TO, READ BACK BY AND VERIFIED WITH: C.Jazzman Loughmiller,RN  03/15/2022 AT 1319 AHUGHES (NOTE) Elevated high sensitivity troponin I (hsTnI) values and significant  changes across serial measurements [place] suggest ACS but many other  chronic and acute conditions are known to elevate hsTnI results.  Refer to the Links section for chest pain algorithms and additional  guidance. Performed at [facility], [place] [address], [place], [place] [address]   Comprehensive metabolic panel     Status: Abnormal   Collection Time: 03/15/22 12:13 PM  Result Value Ref Range   Sodium 136 135 - 145  mmol/L   Potassium 3.5 3.5 - 5.1 mmol/L   Chloride 96 (L) 98 - 111 mmol/L   CO2 27 22 - 32 mmol/L   Glucose, Bld 181 (H) 70 - 99 mg/dL    Comment: Glucose reference range applies only to samples taken after fasting for at least 8 hours.   BUN 56 (H) 8 - 23 mg/dL   Creatinine, Ser 1.36 (H) 0.44 - 1.00 mg/dL   Calcium 9.3 8.9 - 10.3 mg/dL   Total Protein 7.2 6.5 - 8.1 g/dL   Albumin 3.7 3.5 - 5.0 g/dL   AST 20 15 - 41 U/L   ALT 11 0 - 44 U/L   Alkaline Phosphatase 68 38 - 126 U/L   Total Bilirubin 0.6 0.3 - 1.2 mg/dL   GFR, Estimated 38 (L) >60 mL/min    Comment: (NOTE) Calculated using the CKD-EPI Creatinine Equation (2021)    Anion gap 13 5 - 15    Comment: Performed at [facility] [address], [place], [place] [address]  Magnesium     Status: None   Collection Time: 03/15/22 12:13 PM  Result Value Ref Range   Magnesium 2.1 1.7 - 2.4 mg/dL    Comment: Performed at [facility] [address] [place], [place] [address]  Lipase, blood     Status: None   Collection Time: 03/15/22 12:13 PM  Result Value Ref Range   Lipase 51 11 - 51 U/L    Comment: Performed at [facility] [address], [place], [place] [address]  Brain natriuretic peptide (order if patient c/o SOB ONLY)     Status: Abnormal   Collection Time: 03/15/22 12:13 PM  Result Value Ref Range   B Natriuretic Peptide 120.6 (H) 0.0 - 100.0 pg/mL    Comment: Performed at  [facility] [address], [place], [place] [address]  CBC with Differential/Platelet     Status: Abnormal   Collection Time: 03/15/22 12:13 PM  Result Value Ref Range   WBC 10.0 4.0 - 10.5 K/uL   RBC 3.28 (L) 3.87 - 5.11 MIL/uL   Hemoglobin 10.1 (L) 12.0 - 15.0 g/dL   HCT 31.7 (L) 36.0 - 46.0 %   MCV 96.6 80.0 - 100.0 fL   MCH 30.8 26.0 - 34.0 pg   MCHC 31.9 30.0 - 36.0 g/dL   RDW 14.0 11.5 - 15.5 %   Platelets 240 150 - 400 K/uL   nRBC 0.0 0.0 - 0.2 %   Neutrophils Relative % 81 %   Neutro Abs 8.0 (H) 1.7 - 7.7 K/uL   Lymphocytes Relative 10 %   Lymphs Abs 1.0 0.7 - 4.0 K/uL   Monocytes Relative 9 %   Monocytes Absolute 0.9 0.1 - 1.0 K/uL   Eosinophils Relative 0 %   Eosinophils Absolute 0.0 0.0 - 0.5 K/uL   Basophils Relative 0 %   Basophils Absolute 0.0 0.0 - 0.1 K/uL   Immature Granulocytes 0 %   Abs Immature Granulocytes 0.03 0.00 - 0.07 K/uL    Comment: Performed at [facility], [place] [address] [place], [place] [address]  Protime-INR     Status: Abnormal   Collection Time: 03/15/22 12:13 PM  Result Value Ref Range   Prothrombin Time 17.3 (H) 11.4 - 15.2 seconds   INR 1.4 (H) 0.8 - 1.2    Comment: (NOTE) INR goal varies based on device and disease states. Performed at [facility],  [address] [place], [place] [address]   Troponin I (High Sensitivity)     Status: Abnormal   Collection Time: 03/15/22  2:38 PM  Result Value Ref Range   Troponin I (High Sensitivity) 165 (HH) <18 ng/L    Comment: CRITICAL VALUE NOTED.  VALUE IS CONSISTENT WITH PREVIOUSLY REPORTED AND CALLED VALUE. (NOTE) Elevated high sensitivity troponin I (hsTnI) values and significant  changes across serial measurements [place] suggest ACS but many other  chronic and acute conditions are known to elevate hsTnI results.  Refer to the Links section for chest pain algorithms and additional  guidance. Performed at [facility], [place] [address], [place], [place] [address]    Troponin I (High Sensitivity)     Status: Abnormal   Collection Time: 03/15/22  5:21 PM  Result Value Ref Range   Troponin I (High Sensitivity) 184 (HH) <18 ng/L    Comment: CRITICAL VALUE NOTED.  VALUE IS CONSISTENT WITH PREVIOUSLY REPORTED AND CALLED VALUE. (NOTE) Elevated high sensitivity troponin I (hsTnI) values and significant  changes across serial measurements [place] suggest ACS but many other  chronic and acute conditions are known to elevate hsTnI results.  Refer to the Links section for chest pain algorithms and additional  guidance. Performed at [facility], [place] [address], [place], [place] [address]    DG Chest Portable 1 View  Result Date: 03/15/2022 CLINICAL DATA:  Central chest pain EXAM: PORTABLE CHEST 1 VIEW COMPARISON:  03/04/2021 FINDINGS: Stable enlarged cardiac silhouette. No effusion, infiltrate or pneumothorax. Atherosclerotic calcification of the aorta. No acute osseous abnormality. IMPRESSION: No acute cardiopulmonary process. Electronically Signed   By: Suzy Bouchard M.D.   On: 03/15/2022 13:05    Pending Labs Unresulted Labs (From admission, onward)     Start     Ordered   03/17/22 0500  Heparin level (unfractionated)  Daily at 5am,   R      03/15/22 1403   03/17/22 0500  APTT  Daily at 5am,   R      03/15/22 1403   03/16/22 0600  Heparin level (unfractionated)  Once-Timed,   URGENT        03/15/22 1403   03/16/22 0600  APTT  Once-Timed,   STAT        03/15/22 1403   03/16/22 0500  CBC  Daily at 5am,   R      03/15/22 1403   03/16/22 0254  Basic metabolic panel  Tomorrow morning,   R        03/15/22 1957   03/15/22 1738  Sedimentation rate  Once,   URGENT        03/15/22 1737            Vitals/Pain Today's Vitals   03/15/22 1730 03/15/22 1745 03/15/22 1800 03/15/22 1815  BP: (!) 144/82 (!) 146/46 (!) 157/45 (!) 153/48  Pulse: 66 64 63 62  Resp: (!) 27 (!) '26 17 18  '$ Temp:      SpO2: 91% 91% 92% 94%  Weight:      Height:       PainSc:        Isolation Precautions No active isolations  Medications Medications  heparin ADULT infusion 100 units/mL (25000 units/27m) (has no administration in time range)  fentaNYL (SUBLIMAZE) injection 25 mcg (25 mcg Intravenous Given 03/15/22 1727)  sodium chloride flush (NS) 0.9 % injection 3 mL (has no administration in time range)  acetaminophen (TYLENOL) tablet 650 mg (  has no administration in time range)    Or  acetaminophen (TYLENOL) suppository 650 mg (has no administration in time range)  ondansetron (ZOFRAN) tablet 4 mg (has no administration in time range)    Or  ondansetron (ZOFRAN) injection 4 mg (has no administration in time range)  senna-docusate (Senokot-S) tablet 1 tablet (has no administration in time range)  fentaNYL (SUBLIMAZE) injection 25 mcg (25 mcg Intravenous Given 03/15/22 1243)    Mobility walks with person assist Low fall risk   Focused Assessments Cardiac Assessment Handoff:  Cardiac Rhythm: Atrial fibrillation Lab Results  Component Value Date   TROPONINI 0.07 (HH) 09/07/2018   Lab Results  Component Value Date   DDIMER 3.92 (H) 09/05/2018   Does the Patient currently have chest pain? Yes    R Recommendations: See Admitting Provider Note  Report given to: Rondall Allegra RN  Additional Notes:

## 2022-03-15 NOTE — Assessment & Plan Note (Signed)
Renal function stable.  Continue to monitor. 

## 2022-03-15 NOTE — ED Provider Notes (Signed)
[facility] EMERGENCY DEPARTMENT Provider Note   CSN: [id number] Arrival date & time: 03/15/22  1158     History  Chief Complaint  Patient presents with   Chest Pain    [name] is a 87 y.o. female.   Chest Pain Patient presents for chest pain.  Medical history includes breast cancer, colon cancer, GERD, HTN, COPD, osteopenia, CAD, atrial fibrillation, CHF, HLD, TIA, diverticulosis, and mild aortic stenosis.  Onset of chest pain was last night, around midnight.  It is substernal/epigastric in location.  It has been persistent since that time and does not radiate.  Patient took a SL NTG at home without any relief of symptoms.  Due to her persistent symptoms, EMS was ultimately called.  EMS gave 3 more NTG's and patient stated that, although the pressure sensation in her chest improved slightly, she continued to have the sharp pain.  She was also given ASA prior to arrival.  On arrival, she endorses 8/10 severity pain.  It is worsened with deep inspiration as well as exertion.  Other than the pleuritic nature of the pain, she denies any shortness of breath.  She does take Eliquis and did take her dose this morning.  She currently lives alone.  She lost her husband 1 month ago.     Home Medications Prior to Admission medications   Medication Sig Start Date End Date Taking? Authorizing Provider  acetaminophen (TYLENOL) 500 MG tablet Take 500-1,000 mg by mouth every 6 (six) hours as needed for headache (pain).    [provider]  amLODipine (NORVASC) 5 MG tablet Take 1 tablet (5 mg total) by mouth daily. 01/01/22   Milford, Maricela Bo, FNP  apixaban (ELIQUIS) 5 MG TABS tablet Take 1 tablet (5 mg total) by mouth 2 (two) times daily. 12/01/21   Larey Dresser, MD  Ascorbic Acid (VITAMIN C) 1000 MG tablet Take 1,000 mg by mouth once a week.    [provider]  Azelastine HCl 137 MCG/SPRAY SOLN Place 1 spray into both nostrils 2 (two) times daily. As needed  06/10/21   [provider]  Cholecalciferol (VITAMIN D3) 25 MCG (1000 UT) CAPS Take 1,000 Units by mouth once a week.    [provider]  clobetasol ointment (TEMOVATE) 0.05 %     [provider]  clonazePAM (KLONOPIN) 0.5 MG tablet Take 1 tablet by mouth twice daily as needed for anxiety 02/19/22   [name], MD  cloNIDine (CATAPRES) 0.1 MG tablet Take 1 tablet (0.1 mg total) by mouth 2 (two) times daily. 11/06/21   [name], MD  clotrimazole-betamethasone (LOTRISONE) cream Apply 1 application topically as needed.    [provider]  famotidine (PEPCID) 20 MG tablet Take 20 mg by mouth at bedtime. As needed    [provider]  ferrous sulfate 325 (65 FE) MG tablet Take 1 tablet (325 mg total) by mouth daily with breakfast. 07/04/20   [name], MD  fluticasone (FLONASE) 50 MCG/ACT nasal spray Place 1 spray into both nostrils as needed for allergies or rhinitis.    [provider]  losartan (COZAAR) 100 MG tablet Take 1 tablet by mouth once daily 12/25/21   Turner, Eber Hong, MD  nitroGLYCERIN (NITROSTAT) 0.4 MG SL tablet DISSOLVE ONE TABLET UNDER THE TONGUE EVERY 5 MINUTES AS NEEDED FOR CHEST PAIN.  DO NOT EXCEED A TOTAL OF 3 DOSES IN 15 MINUTES 11/06/21   [name], MD  pantoprazole (  PROTONIX) 40 MG tablet Take 1 tablet (40 mg total) by mouth daily. 02/23/22   [name], MD  spironolactone (ALDACTONE) 25 MG tablet Take 1 tablet by mouth once daily 03/05/22   [name], MD  torsemide (DEMADEX) 20 MG tablet Take 4 tablets (80 mg total) by mouth 2 (two) times daily. 01/01/22   Rafael Bihari, FNP      Allergies    Contrast media [iodinated contrast media], Clonidine derivatives, Statins, Sulfa antibiotics, Celebrex [celecoxib], Isosorbide nitrate, Other, and Tape    Review of Systems   Review of Systems  Cardiovascular:  Positive for chest pain.  All other systems reviewed and are negative.   Physical  Exam Updated Vital Signs BP (!) 153/48   Pulse 62   Temp 98.4 F (36.9 C)   Resp 18   Ht '5\' 3"'$  (1.6 m)   Wt 77.6 kg   SpO2 94%   BMI 30.29 kg/m  Physical Exam Vitals and nursing note reviewed.  Constitutional:      General: She is not in acute distress.    Appearance: She is well-developed. She is not ill-appearing, toxic-appearing or diaphoretic.  HENT:     Head: Normocephalic and atraumatic.  Eyes:     Conjunctiva/sclera: Conjunctivae normal.  Cardiovascular:     Rate and Rhythm: Normal rate and regular rhythm.     Heart sounds: No murmur heard. Pulmonary:     Effort: Pulmonary effort is normal. No respiratory distress.     Breath sounds: Normal breath sounds. No decreased breath sounds, wheezing, rhonchi or rales.  Chest:     Chest wall: No tenderness.  Abdominal:     Palpations: Abdomen is soft.     Tenderness: There is no abdominal tenderness.  Musculoskeletal:        General: No swelling.     Cervical back: Normal range of motion and neck supple.     Right lower leg: Edema present.     Left lower leg: Edema present.  Skin:    General: Skin is warm and dry.     Capillary Refill: Capillary refill takes less than 2 seconds.  Neurological:     General: No focal deficit present.     Mental Status: She is alert and oriented to person, place, and time.  Psychiatric:        Mood and Affect: Mood normal.        Behavior: Behavior normal.     ED Results / Procedures / Treatments   Labs (all labs ordered are listed, but only abnormal results are displayed) Labs Reviewed  COMPREHENSIVE METABOLIC PANEL - Abnormal; Notable for the following components:      Result Value   Chloride 96 (*)    Glucose, Bld 181 (*)    BUN 56 (*)    Creatinine, Ser 1.36 (*)    GFR, Estimated 38 (*)    All other components within normal limits  BRAIN NATRIURETIC PEPTIDE - Abnormal; Notable for the following components:   B Natriuretic Peptide 120.6 (*)    All other components within  normal limits  CBC WITH DIFFERENTIAL/PLATELET - Abnormal; Notable for the following components:   RBC 3.28 (*)    Hemoglobin 10.1 (*)    HCT 31.7 (*)    Neutro Abs 8.0 (*)    All other components within normal limits  PROTIME-INR - Abnormal; Notable for the following components:   Prothrombin Time 17.3 (*)    INR 1.4 (*)  All other components within normal limits  TROPONIN I (HIGH SENSITIVITY) - Abnormal; Notable for the following components:   Troponin I (High Sensitivity) 143 (*)    All other components within normal limits  TROPONIN I (HIGH SENSITIVITY) - Abnormal; Notable for the following components:   Troponin I (High Sensitivity) 165 (*)    All other components within normal limits  TROPONIN I (HIGH SENSITIVITY) - Abnormal; Notable for the following components:   Troponin I (High Sensitivity) 184 (*)    All other components within normal limits  MAGNESIUM  LIPASE, BLOOD  CBC  SEDIMENTATION RATE  HEPARIN LEVEL (UNFRACTIONATED)  APTT  TROPONIN I (HIGH SENSITIVITY)    EKG EKG Interpretation  Date/Time:  Monday March 15 2022 12:13:27 EDT Ventricular Rate:  76 PR Interval:    QRS Duration: 86 QT Interval:  404 QTC Calculation: 455 R Axis:   -1 Text Interpretation: Atrial fibrillation Anterior infarct, old Nonspecific repol abnormality, diffuse leads Confirmed by Godfrey Pick [phone]) on 03/15/2022 12:24:37 PM  Radiology DG Chest Portable 1 View  Result Date: 03/15/2022 CLINICAL DATA:  Central chest pain EXAM: PORTABLE CHEST 1 VIEW COMPARISON:  03/04/2021 FINDINGS: Stable enlarged cardiac silhouette. No effusion, infiltrate or pneumothorax. Atherosclerotic calcification of the aorta. No acute osseous abnormality. IMPRESSION: No acute cardiopulmonary process. Electronically Signed   By: Suzy Bouchard M.D.   On: 03/15/2022 13:05    Procedures Procedures    Medications Ordered in ED Medications  heparin ADULT infusion 100 units/mL (25000 units/255m) (has no  administration in time range)  fentaNYL (SUBLIMAZE) injection 25 mcg (25 mcg Intravenous Given 03/15/22 1727)  fentaNYL (SUBLIMAZE) injection 25 mcg (25 mcg Intravenous Given 03/15/22 1243)    ED Course/ Medical Decision Making/ A&P                           Medical Decision Making Amount and/or Complexity of Data Reviewed Labs: ordered. Radiology: ordered.  Risk Prescription drug management.   This patient presents to the ED for concern of chest pain, this involves an extensive number of treatment options, and is a complaint that carries with it a high risk of complications and morbidity.  The differential diagnosis includes ACS, PE, dissection, pneumonia, GERD, gastritis, costochondritis, pericarditis   Co morbidities that complicate the patient evaluation  breast cancer, colon cancer, GERD, HTN, COPD, osteopenia, CAD, atrial fibrillation, CHF, HLD, TIA, diverticulosis, and mild aortic stenosis   Additional history obtained:  Additional history obtained from N/A External records from outside source obtained and reviewed including EMR   Lab Tests:  I Ordered, and personally interpreted labs.  The pertinent results include: Baseline anemia, no leukocytosis but neutrophilia is present, potassium is low range of normal, lipase is normal, BNP only mildly elevated, troponin is elevated at 143.  Delta troponin shows further increase to 165.   Imaging Studies ordered:  I ordered imaging studies including chest x-ray I independently visualized and interpreted imaging which showed no acute findings I agree with the radiologist interpretation   Cardiac Monitoring: / EKG:  The patient was maintained on a cardiac monitor.  I personally viewed and interpreted the cardiac monitored which showed an underlying rhythm of: Atrial fibrillation   Consultations Obtained:  I requested consultation with the cardiologist,  and discussed lab and imaging findings as well as pertinent plan -  they recommend: (Recommendations pending at time of signout)   Problem List / ED Course / Critical interventions / Medication management  Patient is a pleasant 87 year old female presenting from home for substernal/epigastric pain.  Onset was around midnight.  Has persisted throughout the morning.  Pain is pleuritic in nature and is worsened with exertion.  On arrival, she describes 8/10 severity.  No tenderness is present on exam.  Patient's lungs are clear to auscultation.  Her breathing appears unlabored.  EKG shows atrial fibrillation without evidence of ST segment abnormalities.  Prior to arrival, patient received 324 of ASA and a total of 4 SL NTG's.  She is also on Eliquis and did take her dose this morning.  Laboratory work-up was initiated.  Patient was given fentanyl for analgesia.  Lab work shows CKD with creatinine currently improved from baseline, low-normal potassium, mild elevation in BNP, baseline anemia, and no leukocytosis.  Initial troponin was elevated.  Heparin was initiated for NSTEMI.  Cardiology was consulted.  Cardiology states that they will come evaluate the patient in the ED and provide further recommendations. I ordered medication including fentanyl for analgesia; heparin for NSTEMI Reevaluation of the patient after these medicines showed that the patient improved I have reviewed the patients home medicines and have made adjustments as needed   Social Determinants of Health:  Lives dependently   Test / Admission - Considered:  Considered CTA dissection study and CTA PE study, however, patient has a listed allergy to iodinated contrast.  Decision for contrasted CT studies deferred to admitting team.  CRITICAL CARE Performed by: Godfrey Pick   Total critical care time: 32 minutes  Critical care time was exclusive of separately billable procedures and treating other patients.  Critical care was necessary to treat or prevent imminent or life-threatening  deterioration.  Critical care was time spent personally by me on the following activities: development of treatment plan with patient and/or surrogate as well as nursing, discussions with consultants, evaluation of patient's response to treatment, examination of patient, obtaining history from patient or surrogate, ordering and performing treatments and interventions, ordering and review of laboratory studies, ordering and review of radiographic studies, pulse oximetry and re-evaluation of patient's condition.         Final Clinical Impression(s) / ED Diagnoses Final diagnoses:  NSTEMI (non-ST elevated myocardial infarction) [facility])    Rx / DC Orders ED Discharge Orders     None         Godfrey Pick, MD 03/15/22 [id number]

## 2022-03-15 NOTE — H&P (Signed)
History and Physical    [name] ALP:[id number] DOB: 1934-12-09 DOA: 03/15/2022  PCP: [name], MD  Patient coming from: Home via EMS  I have personally briefly reviewed patient's old medical records in [name]  Chief Complaint: Chest pain  HPI: [name] is a 87 y.o. female with medical history significant for persistent atrial fibrillation on Eliquis, nonobstructive CAD, history of Takotsubo cardiomyopathy with recovered EF, HFpEF (EF 65-70% by TTE 04/28/2021), CKD stage IIIb, pulmonary hypertension, COPD, hypertension who presented to the ED for evaluation of chest pain.  Patient reports new onset of substernal chest/epigastric pain beginning shortly after midnight 03/15/2022.  This occurred when she got up to use the bathroom.  Pain has been relatively constant in nature since then.  She reports intermittent sharp stabbing type sensation.  She has no change in discomfort on position.  Pain is worse with deep inspiration.  She reports chronic intermittent cough occasionally productive of white sputum.  ED Course  Labs/Imaging on admission: I have personally reviewed following labs and imaging studies.  Initial vitals showed BP 159/54, pulse 69, RR 18, temp 98.4 F, SPO2 95% on room air.  Labs show sodium 136, potassium 3.5, bicarb 27, BUN 56, creatinine 1.36, serum glucose 181, LFTs within normal limits, magnesium 2.1, lipase 51, BNP 120.6.  Troponin 143 > 165 > 184.  Portable chest x-ray negative for focal consolidation, edema, effusion.  Cardiology was consulted.  Patient was started on IV heparin.  Hospitalist service was consulted to admit for further evaluation and management.  Review of Systems: All systems reviewed and are negative except as documented in history of present illness above.   Past Medical History:  Diagnosis Date   Allergy    rhinitis   Aortic stenosis    mild by echo 06/2017   Arthritis    Bradycardia    a. 10/2017 -> beta blocker cut  back due to HR 39.   Breast cancer ([place]) 01/06/2012   Cancer ([place])    right colon and left breast   Chronic diastolic CHF (congestive heart failure) ([place])    Colon cancer ([place]) 01/06/2012   Colovesical fistula    Dr. Marlou Starks and [name] planning surgery 08/2018- surgery revealed spontaneous closure   COPD (chronic obstructive pulmonary disease) ([place])    pt. denies   Coronary artery disease 2006   a.  NSTEMI in 2016, cath showed 15% prox-mid RCA, 20% prox LAD, EF 25-35% by cath and 35-40% -> felt due to Takotsubo cardiomyopathy.   Dilated aortic root ([place])    26mHg by echo 06/2017   Diverticulosis    Dyspnea    Edema extremities    GERD (gastroesophageal reflux disease)    Hernia    Hiatal hernia    denies   Hyperlipidemia    Hypertension    Mild aortic stenosis    echo 11/2015 but not noted on echo 06/2016   Osteopenia    Permanent atrial fibrillation (HCC)    chronic atrial fibrillation   Pneumonia    hx child   Pulmonary HTN ([place]    a. moderate to severe PASP 664mg echo 11/2015 - now 4122m by echo 06/2017. CTA chest in 11/16 with no PE. PFTs in 7/15 with mild obstructive lung disease. She had a negative sleep study in 2017. b. Felt due to left sided HF.   [facility]  Takotsubo syndrome 07/29/2015   a. EF 35-40% by echo; akinesis of mid-apical anteroseptal  and apical myocardium.  EF now normalized on echo 11/2015    Past Surgical History:  Procedure Laterality Date   APPENDECTOMY     BREAST SURGERY     lumpectomy left   CARDIAC CATHETERIZATION     CARDIAC CATHETERIZATION N/A 07/28/2015   Procedure: Left Heart Cath and Coronary Angiography;  Surgeon: Peter M Martinique, MD;  Location: [place] CV LAB;  Service: Cardiovascular;  Laterality: N/A;   CHOLECYSTECTOMY     COLECTOMY     right side   CYSTOSCOPY WITH STENT PLACEMENT Left 09/04/2018   Procedure: CYSTOSCOPY WITH LEFT STENT PLACEMENT, BLADDER REPAIR, CYSTOSCOPY WITH LEFT STENT REMOVAL;  Surgeon: Raynelle Bring,  MD;  Location: [facility]S;  Service: Urology;  Laterality: Left;   EXCISION OF ACCESSORY NIPPLE Bilateral 05/30/2013   Procedure: BILATERAL NIPPLE BIOPSY;  Surgeon: Merrie Roof, MD;  Location: [facility];  Service: General;  Laterality: Bilateral;   EYE SURGERY Bilateral 12   cataracts   HYSTEROSCOPY WITH D & C N/A 05/29/2020   Procedure: DILATATION AND CURETTAGE /HYSTEROSCOPY;  Surgeon: Homero Fellers, MD;  Location: [facility] ORS;  Service: Gynecology;  Laterality: N/A;   IR RADIOLOGIST EVAL & MGMT  07/26/2017   LAPAROSCOPIC RIGHT COLECTOMY N/A 09/04/2018   Procedure: LAPAROSCOPIC ASSISTED SIGMOID COLECTOMY WITH REPAIR OF FISTULA TO BLADDER;  Surgeon: Jovita Kussmaul, MD;  Location: [facility];  Service: General;  Laterality: N/A;   RIGHT/LEFT HEART CATH AND CORONARY ANGIOGRAPHY N/A 12/10/2020   Procedure: RIGHT/LEFT HEART CATH AND CORONARY ANGIOGRAPHY;  Surgeon: Larey Dresser, MD;  Location: [place] CV LAB;  Service: Cardiovascular;  Laterality: N/A;   SPLIT NIGHT STUDY  02/02/2016        Social History:  reports that she has never smoked. She has never used smokeless tobacco. She reports that she does not drink alcohol and does not use drugs.  Allergies  Allergen Reactions   Contrast Media [Iodinated Contrast Media] Hives and Other (See Comments)    Per pt strong burning sensation starting in chest radiating outward    Clonidine Derivatives Other (See Comments)    Throat dry   Statins Other (See Comments)    "bones hurt"   Sulfa Antibiotics Diarrhea    Tremors    Celebrex [Celecoxib] Rash   Isosorbide Nitrate Itching and Rash   Other Itching and Rash    Plastic and paper tape and heart monitor pads   Tape Itching and Rash    Red Where applied and will spread    Family History  Problem Relation Age of Onset   Heart disease Mother    Heart attack Mother    Cancer Sister        stomach and colon   Heart disease Brother 77   Hypertension Father    Cancer Sister    Stroke Neg  Hx      Prior to Admission medications   Medication Sig Start Date End Date Taking? Authorizing Provider  acetaminophen (TYLENOL) 500 MG tablet Take 500-1,000 mg by mouth every 6 (six) hours as needed for headache (pain).    [provider]  amLODipine (NORVASC) 5 MG tablet Take 1 tablet (5 mg total) by mouth daily. 01/01/22   Milford, Maricela Bo, FNP  apixaban (ELIQUIS) 5 MG TABS tablet Take 1 tablet (5 mg total) by mouth 2 (two) times daily. 12/01/21   Larey Dresser, MD  Ascorbic Acid (VITAMIN C) 1000 MG tablet Take 1,000 mg by mouth once a week.  [provider]  Azelastine HCl 137 MCG/SPRAY SOLN Place 1 spray into both nostrils 2 (two) times daily. As needed 06/10/21   [provider]  Cholecalciferol (VITAMIN D3) 25 MCG (1000 UT) CAPS Take 1,000 Units by mouth once a week.    [provider]  clobetasol ointment (TEMOVATE) 0.05 %     [provider]  clonazePAM (KLONOPIN) 0.5 MG tablet Take 1 tablet by mouth twice daily as needed for anxiety 02/19/22   [name], MD  cloNIDine (CATAPRES) 0.1 MG tablet Take 1 tablet (0.1 mg total) by mouth 2 (two) times daily. 11/06/21   [name], MD  clotrimazole-betamethasone (LOTRISONE) cream Apply 1 application topically as needed.    [provider]  famotidine (PEPCID) 20 MG tablet Take 20 mg by mouth at bedtime. As needed    [provider]  ferrous sulfate 325 (65 FE) MG tablet Take 1 tablet (325 mg total) by mouth daily with breakfast. 07/04/20   [name], MD  fluticasone (FLONASE) 50 MCG/ACT nasal spray Place 1 spray into both nostrils as needed for allergies or rhinitis.    [provider]  losartan (COZAAR) 100 MG tablet Take 1 tablet by mouth once daily 12/25/21   Turner, Eber Hong, MD  nitroGLYCERIN (NITROSTAT) 0.4 MG SL tablet DISSOLVE ONE TABLET UNDER THE TONGUE EVERY 5 MINUTES AS NEEDED FOR CHEST PAIN.  DO NOT EXCEED A TOTAL OF 3 DOSES IN 15 MINUTES  11/06/21   Turner, Eber Hong, MD  pantoprazole (PROTONIX) 40 MG tablet Take 1 tablet (40 mg total) by mouth daily. 02/23/22   [name], MD  spironolactone (ALDACTONE) 25 MG tablet Take 1 tablet by mouth once daily 03/05/22   [name], MD  torsemide (DEMADEX) 20 MG tablet Take 4 tablets (80 mg total) by mouth 2 (two) times daily. 01/01/22   Rafael Bihari, FNP    Physical Exam: Vitals:   03/15/22 1900 03/15/22 1915 03/15/22 1930 03/15/22 1945  BP: (!) 137/41 (!) 126/50 (!) 136/42 (!) 142/55  Pulse: 64 64 (!) 56 62  Resp: (!) 26 (!) 24 (!) 24 (!) 23  Temp:      SpO2: 92% 93% 94% 94%  Weight:      Height:       Constitutional: Elderly woman resting in bed with head slightly elevated.  NAD, calm, comfortable Eyes: EOMI, lids and conjunctivae normal ENMT: Mucous membranes are moist. Posterior pharynx clear of any exudate or lesions.Normal dentition.  Neck: normal, supple, no masses. Respiratory: clear to auscultation bilaterally, no wheezing, no crackles. Normal respiratory effort. No accessory muscle use.  Cardiovascular: Regular rate and rhythm, 3/6 systolic murmur.  +1 bilateral lower extremity edema.  Abdomen: no tenderness, no masses palpated. No hepatosplenomegaly. Bowel sounds positive.  Musculoskeletal: no clubbing / cyanosis. No joint deformity upper and lower extremities. Good ROM, no contractures. Normal muscle tone.  Skin: no rashes, lesions, ulcers. No induration Neurologic: Sensation intact. Strength 5/5 in all 4.  Psychiatric:  Alert and oriented x 3. Normal mood.   EKG: Personally reviewed.  Atrial fibrillation, rate 76, similar to prior.  Assessment/Plan Principal Problem:   Chest pain Active Problems:   Persistent atrial fibrillation (HCC)   Hypertension   Chronic diastolic CHF (congestive heart failure) (HCC)   Chronic kidney disease, stage 3b (HCC)   Elevated troponin   Karynn M Limon is a 87 y.o. female with medical history significant for persistent  atrial fibrillation on Eliquis,  nonobstructive CAD, history of Takotsubo cardiomyopathy with recovered EF, HFpEF (EF 65-70% by TTE 04/28/2021), CKD stage IIIb, pulmonary hypertension, COPD, hypertension who is admitted for evaluation of chest pain with elevated troponin.  Assessment and Plan: * Chest pain Elevated troponin Atypical/pleuritic type chest and epigastricpain with elevated troponin.  Has history of Takotsubo cardiomyopathy with recovered EF and nonobstructive CAD by RHC/LHC April 2022.  Has been intolerant of statins and Zetia. -Cardiology following -Started on IV heparin -Obtain echocardiogram -Monitor on telemetry -Follow ESR  Persistent atrial fibrillation ([place]) Remains in atrial fibrillation with controlled rate.  Continue Toprol-XL.  Eliquis on hold while on IV heparin.  Chronic kidney disease, stage 3b ([place]) Renal function stable.  Continue to monitor.  Chronic diastolic CHF (congestive heart failure) (HCC) Euvolemic on admission.  EF 65-70% by TTE 04/28/2021.  Has associated pulmonary hypertension. -Continue home torsemide 80 mg twice daily -Monitor strict I/O's and daily weights -Update echocardiogram as above  Hypertension Continue home amlodipine, clonidine, losartan, Toprol-XL, spironolactone, torsemide.  DVT prophylaxis: IV heparin Code Status: Full code, confirmed with patient on admission Family Communication: Discussed with nephew's spouse at bedside Disposition Plan: From home, dispo pending clinical progress Consults called: Cardiology Severity of Illness: The appropriate patient status for this patient is OBSERVATION. Observation status is judged to be reasonable and necessary in order to provide the required intensity of service to ensure the patient's safety. The patient's presenting symptoms, physical exam findings, and initial radiographic and laboratory data in the context of their medical condition is felt to place them at decreased risk for further  clinical deterioration. Furthermore, it is anticipated that the patient will be medically stable for discharge from the hospital within 2 midnights of admission.   Zada Finders MD [facility]  If 7PM-7AM, please contact night-coverage [email or web address]  03/15/2022, 8:03 PM

## 2022-03-15 NOTE — Progress Notes (Signed)
ANTICOAGULATION CONSULT NOTE - Initial Consult  Pharmacy Consult for Heparin Indication: chest pain/ACS  Allergies  Allergen Reactions   Contrast Media [Iodinated Contrast Media] Hives and Other (See Comments)    Per pt strong burning sensation starting in chest radiating outward    Clonidine Derivatives Other (See Comments)    Throat dry   Statins Other (See Comments)    "bones hurt"   Sulfa Antibiotics Diarrhea    Tremors    Celebrex [Celecoxib] Rash   Isosorbide Nitrate Itching and Rash   Other Itching and Rash    Plastic and paper tape and heart monitor pads   Tape Itching and Rash    Red Where applied and will spread    Patient Measurements: Height: '5\' 3"'$  (160 cm) Weight: 77.6 kg (171 lb) IBW/kg (Calculated) : 52.4 Heparin Dosing Weight: 69 kg  Vital Signs: Temp: 98.4 F (36.9 C) (07/10 1215) BP: 139/50 (07/10 1330) Pulse Rate: 63 (07/10 1330)  Labs: Recent Labs    03/15/22 1213  HGB 10.1*  HCT 31.7*  PLT 240  LABPROT 17.3*  INR 1.4*  CREATININE 1.36*  TROPONINIHS 143*    Estimated Creatinine Clearance: 29.8 mL/min (A) (by C-G formula based on SCr of 1.36 mg/dL (H)).   Medical History: Past Medical History:  Diagnosis Date   Allergy    rhinitis   Aortic stenosis    mild by echo 06/2017   Arthritis    Bradycardia    a. 10/2017 -> beta blocker cut back due to HR 39.   Breast cancer ([place]) 01/06/2012   Cancer ([place])    right colon and left breast   Chronic diastolic CHF (congestive heart failure) ([place])    Colon cancer ([place]) 01/06/2012   Colovesical fistula    Dr. Marlou Starks and [name] planning surgery 08/2018- surgery revealed spontaneous closure   COPD (chronic obstructive pulmonary disease) ([place])    pt. denies   Coronary artery disease 2006   a.  NSTEMI in 2016, cath showed 15% prox-mid RCA, 20% prox LAD, EF 25-35% by cath and 35-40% -> felt due to Takotsubo cardiomyopathy.   Dilated aortic root ([place])    88mHg by echo 06/2017   Diverticulosis     Dyspnea    Edema extremities    GERD (gastroesophageal reflux disease)    Hernia    Hiatal hernia    denies   Hyperlipidemia    Hypertension    Mild aortic stenosis    echo 11/2015 but not noted on echo 06/2016   Osteopenia    Permanent atrial fibrillation (HCC)    chronic atrial fibrillation   Pneumonia    hx child   Pulmonary HTN ([facility]    a. moderate to severe PASP 678mg echo 11/2015 - now 411m by echo 06/2017. CTA chest in 11/16 with no PE. PFTs in 7/15 with mild obstructive lung disease. She had a negative sleep study in 2017. b. Felt due to left sided HF.   [facility]  Takotsubo syndrome 07/29/2015   a. EF 35-40% by echo; akinesis of mid-apical anteroseptal and apical myocardium.  EF now normalized on echo 11/2015    Medications:  Awaiting home med rec  Assessment: [age]o. F presents with CP. Pt on apixaban PTA for afib. Last dose '5mg'$  taken 7/10 about 8am . To begin heparin per pharmacy for r/o ACS. CBC ok on admission. Apixaban will be affecting heparin levels so will utilize aPTT until levels are correlating.  Goal of Therapy:  Heparin level 0.3-0.7 units/ml, aPTT 66-102 sec Monitor platelets by anticoagulation protocol: Yes   Plan:  At 2000 (12 hours post apixaban dose), will start heparin 950 units/hr Will f/u 8 hr heparin level and aPTT post gtt start Daily heparin level, CBC, and aPTT  Sherlon Handing, PharmD, BCPS Please see amion for complete clinical pharmacist phone list 03/15/2022,1:55 PM

## 2022-03-15 NOTE — Consult Note (Addendum)
The patient has been seen in conjunction with Vin Bhagat, PAC. All aspects of care have been considered and discussed. The patient has been personally interviewed, examined, and all clinical data has been reviewed.  Elevated flat Troponin and non-ischemic ECG. Prior h/o Takotsubo CM. Non obstructive CAD 2022. Last EF 70% 2022. No rub on auscultation. Current CP sounds pleuritic.  Plan echocardiogram and clinical follow up.     Cardiology Consultation:   Patient ID: [name] MRN: [id number]; DOB: [birth year]  Admit date: 03/15/2022 Date of Consult: 03/15/2022  PCP:  [name], MD   [facility] Providers Cardiologist:  Fransico Him, MD  Advanced Heart Failure:  Loralie Champagne, MD  {  Patient Profile:   [name] is a 87 y.o. female with a hx of persistent atrial fibrillation Eliquis nonobstructive CAD, prior Takotsubo cardiomyopathy 2016 with recovered LV function, chronic diastolic heart failure, hypertension, aortic stenosis, ascending aortic aneurysm (4.3 by CTA in 07/2015), pulmonary hypertension, bradycardia (not on AV nodal blocking agent) and history of breast cancer who is being seen 03/15/2022 for the evaluation of elevated troponin at the request of Dr. Doren Custard.  Admitted November 2016 with chest pain.  Echocardiogram with LV function of 35 to 40%.  Underwent cardiac catheterization showing no significant CAD.  Suspected Takotsubo cardiomyopathy.  Follow-up echocardiogram 11/2015 showed improved LV function to 60 to 65%.  RHC/LHC in 4/22 showed nonobstructive CAD; mean RA 5, PA 44/10, mean PCWP 20, CI 4.89, PVR < 1 WU.   Echo in 8/22 showed EF up to 65-70%, mild LVH, normal RV, PASP 67, moderate RAE, trivial MR.  PYP scan in 9/22 was probably negative.   History of Present Illness:   [name] presented for evaluation of epigastric/lower sternal chest pain for past 3 to 4 weeks.  She is a poor historian.  She was seen by PCP for epigastric pain with bandlike  sensation below xiphoid process 6/20.  Also noted lower extremity edema and taking less than prescribed torsemide ( 7 tablet).   Recommended to increased to 8 tablet daily as prescribed.  Her pain was reproducible with palpation.  She was started empirically on Protonix.  However her symptoms continued to get worse.  She describes her pain as a sharp sensation radiating to her back.  Last week she had a worsening pain while doing weed eating.  On and off since then.  She woke up from sleep around 8 AM last night with 8 out of 10 sharp chest pain around epigastric area.  This was radiating to her back.  Some shortness of breath and nausea.  No diaphoresis or dyspnea.  Patient feels congested on upper chest.  Her pain is worse with deep breath.  No recent illness or sick contact.  Reports compliance with medications.  Last dose of Eliquis a.m. of this morning.  Due to ongoing pain EMS was called.  She was given 3 sublingual nitroglycerin in route without improvement on her symptoms.  High-sensitivity troponin 143>> 165 BNP 120 Creatinine 1.36 Potassium 3.5 Hemoglobin 10.1  Patient has chronic dark stool as he is on iron supplement.  Compliant with low-sodium diet.  Denies dizziness, orthopnea, PND or palpitations.   Past Medical History:  Diagnosis Date   Allergy    rhinitis   Aortic stenosis    mild by echo 06/2017   Arthritis    Bradycardia    a. 10/2017 -> beta blocker cut back due to HR 39.   Breast cancer (  [place]) 01/06/2012   Cancer ([place])    right colon and left breast   Chronic diastolic CHF (congestive heart failure) ([place])    Colon cancer ([place]) 01/06/2012   Colovesical fistula    Dr. Marlou Starks and [name] planning surgery 08/2018- surgery revealed spontaneous closure   COPD (chronic obstructive pulmonary disease) ([place])    pt. denies   Coronary artery disease 2006   a.  NSTEMI in 2016, cath showed 15% prox-mid RCA, 20% prox LAD, EF 25-35% by cath and 35-40% -> felt due to Takotsubo  cardiomyopathy.   Dilated aortic root ([place])    28mHg by echo 06/2017   Diverticulosis    Dyspnea    Edema extremities    GERD (gastroesophageal reflux disease)    Hernia    Hiatal hernia    denies   Hyperlipidemia    Hypertension    Mild aortic stenosis    echo 11/2015 but not noted on echo 06/2016   Osteopenia    Permanent atrial fibrillation (HCC)    chronic atrial fibrillation   Pneumonia    hx child   [place]ulmonary [place]N ([place]    a. moderate to severe PASP 649mg echo 11/2015 - now 417m by echo 06/2017. CTA chest in 11/16 with no PE. PFTs in 7/15 with mild obstructive lung disease. She had a negative sleep study in 2017. b. Felt due to left sided HF.   [facility]  Takotsubo syndrome 07/29/2015   a. EF 35-40% by echo; akinesis of mid-apical anteroseptal and apical myocardium.  EF now normalized on echo 11/2015    Past Surgical History:  Procedure Laterality Date   APPENDECTOMY     BREAST SURGERY     lumpectomy left   CARDIAC CATHETERIZATION     CARDIAC CATHETERIZATION N/A 07/28/2015   Procedure: Left Heart Cath and Coronary Angiography;  Surgeon: Peter M JorMartiniqueD;  Location: [facility];  Service: Cardiovascular;  Laterality: N/A;   CHOLECYSTECTOMY     COLECTOMY     right side   CYSTOSCOPY WITH STENT PLACEMENT Left 09/04/2018   Procedure: CYSTOSCOPY WITH LEFT STENT PLACEMENT, BLADDER REPAIR, CYSTOSCOPY WITH LEFT STENT REMOVAL;  Surgeon: BorRaynelle BringD;  Location: [facility];  Service: Urology;  Laterality: Left;   EXCISION OF ACCESSORY NIPPLE Bilateral 05/30/2013   Procedure: BILATERAL NIPPLE BIOPSY;  Surgeon: PauMerrie RoofD;  [facility];  Laterality: Bilateral;   EYE SURGERY Bilateral 12   cataracts   HYSTEROSCOPY WITH D & C N/A 05/29/2020   Procedure: DILATATION AND CURETTAGE /HYSTEROSCOPY;  Surgeon: SchHomero FellersD;  Location: [facility];  Service: Gynecology;  Laterality: N/A;   IR RADIOLOGIST EVAL & MGMT  07/26/2017    LAPAROSCOPIC RIGHT COLECTOMY N/A 09/04/2018   Procedure: LAPAROSCOPIC ASSISTED SIGMOID COLECTOMY WITH REPAIR OF FISTULA TO BLADDER;  Surgeon: TotJovita KussmaulD;  Location: [facility];  Service: General;  Laterality: N/A;   RIGHT/LEFT HEART CATH AND CORONARY ANGIOGRAPHY N/A 12/10/2020   Procedure: RIGHT/LEFT HEART CATH AND CORONARY ANGIOGRAPHY;  Surgeon: McLLarey DresserD;  Location: [facility];  Service: Cardiovascular;  Laterality: N/A;   SPLIT NIGHT STUDY  02/02/2016          Inpatient Medications: Scheduled Meds:  Continuous Infusions:  heparin     PRN Meds:   Allergies:    Allergies  Allergen Reactions   Contrast Media [Iodinated Contrast Media] Hives and Other (See Comments)    Per  pt strong burning sensation starting in chest radiating outward    Clonidine Derivatives Other (See Comments)    Throat dry   Statins Other (See Comments)    "bones hurt"   Sulfa Antibiotics Diarrhea    Tremors    Celebrex [Celecoxib] Rash   Isosorbide Nitrate Itching and Rash   Other Itching and Rash    Plastic and paper tape and heart monitor pads   Tape Itching and Rash    Red Where applied and will spread    Social History:   Social History   Socioeconomic History   Marital status: Married    Spouse name: Not on file   Number of children: Not on file   Years of education: Not on file   Highest education level: Not on file  Occupational History   Not on file  Tobacco Use   Smoking status: Never   Smokeless tobacco: Never  Vaping Use   Vaping Use: Never used  Substance and Sexual Activity   Alcohol use: No   Drug use: No   Sexual activity: Not Currently  Other Topics Concern   Not on file  Social History Narrative   Not on file   Social Determinants of Health   Financial Resource Strain: Not on file  Food Insecurity: Not on file  Transportation Needs: Not on file  Physical Activity: Not on file  Stress: Not on file  Social Connections: Not on file  Intimate  Partner Violence: Not on file    Family History:   Family History  Problem Relation Age of Onset   Heart disease Mother    Heart attack Mother    Cancer Sister        stomach and colon   Heart disease Brother 14   Hypertension Father    Cancer Sister    Stroke Neg Hx      ROS:  Please see the history of present illness.  All other ROS reviewed and negative.     Physical Exam/Data:   Vitals:   03/15/22 1545 03/15/22 1600 03/15/22 1615 03/15/22 1725  BP: (!) 156/60 (!) 144/65 (!) 156/57 (!) 151/48  Pulse: 66 66 72 63  Resp: (!) 23 (!) 26 (!) 23 (!) 27  Temp:      SpO2: 94% 92% 93% 93%  Weight:      Height:       No intake or output data in the 24 hours ending 03/15/22 1736    03/15/2022   12:12 PM 03/05/2022    9:43 AM 02/23/2022   10:50 AM  Last 3 Weights  Weight (lbs) 171 lb 172 lb 176 lb  Weight (kg) 77.565 kg 78.019 kg 79.833 kg     Body mass index is 30.29 kg/m.  General:  Well nourished, well developed, in no acute distress HEENT: normal Neck: no JVD Vascular: No carotid bruits; Distal pulses 2+ bilaterally Cardiac:  normal S1, S2; irregular, +  murmur  Lungs:  clear to auscultation bilaterally, no wheezing, rhonchi or rales  Abd: soft, nontender, no hepatomegaly  Ext: no edema Musculoskeletal:  No deformities, BUE and BLE strength normal and equal Skin: warm and dry  Neuro:  CNs 2-12 intact, no focal abnormalities noted Psych:  Normal affect   EKG:  The EKG was personally reviewed and demonstrates:  Atrial fibrillation Telemetry:  Telemetry was personally reviewed and demonstrates:  Afib at controlled rate  Relevant CV Studies:  MYOCARDIAL AMYLOID IMAGING PLANAR AND SPECT 05/2021 Heart to contralateral  lung ratio is between 1-1.5, indeterminate for amyloid. By semi-quantitative assessment scan is consistent with increased heart uptake but less than rib uptake-Grade 1.   Study is equivocal for TTR amyloidosis (visual score of 1/ratio between 1-1.5).    Prior study not available for comparison.   No SPECT data performed or available for review.   Echo 04/28/2021  1. Left ventricular ejection fraction, by estimation, is 65 to 70%. The  left ventricle has normal function. The left ventricle has no regional  wall motion abnormalities. There is mild concentric left ventricular  hypertrophy. Left ventricular diastolic  function could not be evaluated. Elevated left ventricular end-diastolic  pressure.   2. Right ventricular systolic function is normal. The right ventricular  size is normal. There is severely elevated pulmonary artery systolic  pressure. The estimated right ventricular systolic pressure is 29.7 mmHg.   3. Left atrial size was moderately dilated.   4. Right atrial size was moderately dilated.   5. The mitral valve is grossly normal. Trivial mitral valve  regurgitation. No evidence of mitral stenosis. Severe mitral annular  calcification.   6. Tricuspid valve regurgitation is moderate.   7. The aortic valve is tricuspid. There is moderate calcification of the  aortic valve. There is mild thickening of the aortic valve. Aortic valve  regurgitation is not visualized. Mild aortic valve stenosis.   8. Aortic dilatation noted. There is borderline dilatation of the  ascending aorta, measuring 38 mm.   9. The inferior vena cava is dilated in size with >50% respiratory  variability, suggesting right atrial pressure of 8 mmHg.   Comparison(s): No significant change from prior study.   RIGHT/LEFT HEART CATH AND CORONARY ANGIOGRAPHY  12/2020   Conclusion    Mid LM lesion is 30% stenosed. Ost RCA lesion is 50% stenosed. Prox RCA lesion is 30% stenosed. RPAV lesion is 30% stenosed.   1. Normal RA pressure with elevated PCWP and prominent V-waves.  2. Mild pulmonary venous hypertension.  3. Nonobstructive coronary disease.    Elevated PCWP with prominent V-waves suggestive of significant mitral regurgitation versus significant  LV diastolic dysfunction.  I reviewed prior echo, no significant MR.  I suspect this is representative of significant LV diastolic dysfunction.  I am concerned for possibility of cardiac amyloidosis, think patient needs workup for this.    Laboratory Data:  High Sensitivity Troponin:   Recent Labs  Lab 03/15/22 1213 03/15/22 1438  TROPONINIHS 143* 165*     Chemistry Recent Labs  Lab 03/15/22 1213  NA 136  K 3.5  CL 96*  CO2 27  GLUCOSE 181*  BUN 56*  CREATININE 1.36*  CALCIUM 9.3  MG 2.1  GFRNONAA 38*  ANIONGAP 13    Recent Labs  Lab 03/15/22 1213  PROT 7.2  ALBUMIN 3.7  AST 20  ALT 11  ALKPHOS 68  BILITOT 0.6    Hematology Recent Labs  Lab 03/15/22 1213  WBC 10.0  RBC 3.28*  HGB 10.1*  HCT 31.7*  MCV 96.6  MCH 30.8  MCHC 31.9  RDW 14.0  PLT 240   BNP Recent Labs  Lab 03/15/22 1213  BNP 120.6*     Radiology/Studies:  DG Chest Portable 1 View  Result Date: 03/15/2022 CLINICAL DATA:  Central chest pain EXAM: PORTABLE CHEST 1 VIEW COMPARISON:  03/04/2021 FINDINGS: Stable enlarged cardiac silhouette. No effusion, infiltrate or pneumothorax. Atherosclerotic calcification of the aorta. No acute osseous abnormality. IMPRESSION: No acute cardiopulmonary process. Electronically Signed   By: Nicole Kindred  Leonia Reeves M.D.   On: 03/15/2022 13:05     Assessment and Plan:   Chest/epigastric pain with elevated troponin  -Presented with worsening 3 to 4 weeks history of epigastric/lower sternal chest pain.  She was prescribed Protonix and advised to take regular dose of torsemide by PCP 6/20. Symptoms continue to get worse.  Had exertional chest pain while doing weed eating last week.  Constant 8 out of 10 sharp chest pain at epigastric area radiating to her back since 1 AM.  EKG without acute ischemic changes. Hs-troponin 143>> 165.  She has some pleuritic component as well, symptoms with deep breath.  Systolic blood pressure minimally elevated in 140 to 150. -Reports  compliance with Eliquis, last dose this morning -differential includes ACS, pericarditis, dissection versus GI etiology -Continue to cycle troponin -Get echocardiogram -Agree with holding Eliquis and starting heparin -No beta-blocker given prior bradycardia -Last cath 12/2020 showed mild nonobstructive CAD  2.  Chronic diastolic heart failure - RHC/LHC in 4/22 showed nonobstructive CAD; mean RA 5, PA 44/10, mean PCWP 20, CI 4.89, PVR < 1 WU (pulmonary venous hypertension).  Echo in 8/22 showed EF up to 65-70%, mild LVH, normal RV, PASP 67, moderate RAE, trivial MR. PYP scan in 9/22 was probably negative. - Will get ReDs tomorrow -Checks x-ray without edema -BNP 120 -Resume home torsemide  3.  Persistent atrial fibrillation -Not on AV nodal blocking agent due to baseline bradycardia -Eliquis on hold -Continue heparin  4.  Hypertension -Blood pressure minimally elevated  Risk Assessment/Risk Scores:    TIMI Risk Score for Unstable Angina or Non-ST Elevation MI:   The patient's TIMI risk score is 4, which indicates a 20% risk of all cause mortality, new or recurrent myocardial infarction or need for urgent revascularization in the next 14 days.{   For questions or updates, please contact [place] Please consult [email or web address] for contact info under    Jarrett Soho, [place]  03/15/2022 5:36 PM

## 2022-03-15 NOTE — Assessment & Plan Note (Signed)
Euvolemic on admission.  EF 65-70% by TTE 04/28/2021.  Has associated pulmonary hypertension. -Continue home torsemide 80 mg twice daily -Monitor strict I/O's and daily weights -Update echocardiogram as above

## 2022-03-15 NOTE — Telephone Encounter (Signed)
  Chief Complaint: Chest Pain Symptoms: Across both breasts, "And middle" radiates to back. 8/10. Deep breath worsens. "Congested" SOB, weakness Frequency: 1200 MN last night Pertinent Negatives: Patient denies  Disposition: '[x]'$ ED /'[]'$ Urgent Care (no appt availability in office) / '[]'$ Appointment(In office/virtual)/ '[]'$  [facility]/ '[]'$ Home Care/ '[]'$ Refused Recommended Disposition /'[]'$ [facility]/ '[]'$  Follow-up with PCP Additional Notes: Pt declined ED, reiterated need for ED eval with her H/O. States she will call "Heart failure doctor and see what he says." Again advised ED. Assured pt   NT would route to practice for PCPs review.  Reason for Disposition  Taking a deep breath makes pain worse  Answer Assessment - Initial Assessment Questions 1. LOCATION: "Where does it hurt?"       Under breasts 2. RADIATION: "Does the pain go anywhere else?" (e.g., into neck, jaw, arms, back)     Back 3. ONSET: "When did the chest pain begin?" (Minutes, hours or days)      Last night MN 4. PATTERN "Does the pain come and go, or has it been constant since it started?"  "Does it get worse with exertion?"      Constant 5. DURATION: "How long does it last" (e.g., seconds, minutes, hours)     Constant 6. SEVERITY: "How bad is the pain?"  (e.g., Scale 1-10; mild, moderate, or severe)    - MILD (1-3): doesn't interfere with normal activities     - MODERATE (4-7): interferes with normal activities or awakens from sleep    - SEVERE (8-10): excruciating pain, unable to do any normal activities       8/10 at least 7. CARDIAC RISK FACTORS: "Do you have any history of heart problems or risk factors for heart disease?" (e.g., angina, prior heart attack; diabetes, high blood pressure, high cholesterol, smoker, or strong family history of heart disease)      8. PULMONARY RISK FACTORS: "Do you have any history of lung disease?"  (e.g., blood clots in lung, asthma, emphysema, birth control pills)       9. CAUSE: "What do you think is causing the chest pain?"    Unsure 10. OTHER SYMPTOMS: "Do you have any other symptoms?" (e.g., dizziness, nausea, vomiting, sweating, fever, difficulty breathing, cough)       Weak, SOB  Protocols used: Chest Pain-A-AH

## 2022-03-15 NOTE — Assessment & Plan Note (Signed)
Remains in atrial fibrillation with controlled rate.  Continue Toprol-XL.  Eliquis on hold while on IV heparin.

## 2022-03-15 NOTE — Assessment & Plan Note (Addendum)
Elevated troponin Atypical/pleuritic type chest and epigastricpain with elevated troponin.  Has history of Takotsubo cardiomyopathy with recovered EF and nonobstructive CAD by RHC/LHC April 2022.  Has been intolerant of statins and Zetia. -Cardiology following -Started on IV heparin -Obtain echocardiogram -Monitor on telemetry -Follow ESR

## 2022-03-15 NOTE — Assessment & Plan Note (Signed)
Continue home amlodipine, clonidine, losartan, Toprol-XL, spironolactone, torsemide.

## 2022-03-15 NOTE — ED Triage Notes (Signed)
Pt BIB EMS c/o central chest pain w/ no radiation. Pt states it's a sharp pain 8/10. Patient describes pain hurts most when taking deep breaths. CP started this morning at midnight and continued with no relief after taking 1 Sl nitro. EMS gave pt 3 SL nitros on route.   VSS w/ EMS. L arm restricted.

## 2022-03-16 ENCOUNTER — Observation Stay: Payer: Self-pay

## 2022-03-16 ENCOUNTER — Observation Stay (HOSPITAL_COMMUNITY): Payer: Medicare Other

## 2022-03-16 DIAGNOSIS — I517 Cardiomegaly: Secondary | ICD-10-CM | POA: Diagnosis not present

## 2022-03-16 DIAGNOSIS — I214 Non-ST elevation (NSTEMI) myocardial infarction: Secondary | ICD-10-CM | POA: Diagnosis not present

## 2022-03-16 DIAGNOSIS — Z452 Encounter for adjustment and management of vascular access device: Secondary | ICD-10-CM | POA: Diagnosis not present

## 2022-03-16 DIAGNOSIS — I5032 Chronic diastolic (congestive) heart failure: Secondary | ICD-10-CM | POA: Diagnosis not present

## 2022-03-16 DIAGNOSIS — R778 Other specified abnormalities of plasma proteins: Secondary | ICD-10-CM

## 2022-03-16 DIAGNOSIS — R079 Chest pain, unspecified: Secondary | ICD-10-CM | POA: Diagnosis not present

## 2022-03-16 LAB — CBC
HCT: 29.7 % — ABNORMAL LOW (ref 36.0–46.0)
Hemoglobin: 9.7 g/dL — ABNORMAL LOW (ref 12.0–15.0)
MCH: 30.8 pg (ref 26.0–34.0)
MCHC: 32.7 g/dL (ref 30.0–36.0)
MCV: 94.3 fL (ref 80.0–100.0)
Platelets: 206 10*3/uL (ref 150–400)
RBC: 3.15 MIL/uL — ABNORMAL LOW (ref 3.87–5.11)
RDW: 14.1 % (ref 11.5–15.5)
WBC: 8.3 10*3/uL (ref 4.0–10.5)
nRBC: 0 % (ref 0.0–0.2)

## 2022-03-16 LAB — BASIC METABOLIC PANEL
Anion gap: 11 (ref 5–15)
BUN: 53 mg/dL — ABNORMAL HIGH (ref 8–23)
CO2: 25 mmol/L (ref 22–32)
Calcium: 9.4 mg/dL (ref 8.9–10.3)
Chloride: 101 mmol/L (ref 98–111)
Creatinine, Ser: 1.38 mg/dL — ABNORMAL HIGH (ref 0.44–1.00)
GFR, Estimated: 38 mL/min — ABNORMAL LOW (ref >60)
Glucose, Bld: 110 mg/dL — ABNORMAL HIGH (ref 70–99)
Potassium: 4 mmol/L (ref 3.5–5.1)
Sodium: 137 mmol/L (ref 135–145)

## 2022-03-16 LAB — MRSA NEXT GEN BY PCR, NASAL: MRSA by PCR Next Gen: NOT DETECTED

## 2022-03-16 LAB — ECHOCARDIOGRAM COMPLETE
Height: 63 in
Weight: 2672 oz

## 2022-03-16 LAB — APTT: aPTT: 43 seconds — ABNORMAL HIGH (ref 24–36)

## 2022-03-16 LAB — HEPARIN LEVEL (UNFRACTIONATED): Heparin Unfractionated: >1.1 IU/mL — ABNORMAL HIGH (ref 0.30–0.70)

## 2022-03-16 MED ORDER — SODIUM CHLORIDE 0.9% FLUSH
10.0000 mL | Freq: Two times a day (BID) | INTRAVENOUS | Status: DC
  Administered 2022-03-16: 10 mL

## 2022-03-16 MED ORDER — PREDNISONE 50 MG PO TABS
50.0000 mg | ORAL_TABLET | Freq: Four times a day (QID) | ORAL | Status: AC
  Administered 2022-03-16 – 2022-03-17 (×3): 50 mg via ORAL
  Filled 2022-03-16 (×3): qty 1

## 2022-03-16 MED ORDER — SODIUM CHLORIDE 0.9% FLUSH: 10.0000 mL | INTRAVENOUS | Status: DC | PRN

## 2022-03-16 MED ORDER — CHLORHEXIDINE GLUCONATE CLOTH 2 % EX PADS
6.0000 | MEDICATED_PAD | Freq: Every day | CUTANEOUS | Status: DC
  Administered 2022-03-16: 6 via TOPICAL

## 2022-03-16 MED ORDER — DIPHENHYDRAMINE HCL 25 MG PO CAPS
50.0000 mg | ORAL_CAPSULE | Freq: Once | ORAL | Status: AC
  Administered 2022-03-17: 50 mg via ORAL
  Filled 2022-03-16: qty 2

## 2022-03-16 MED ORDER — DIPHENHYDRAMINE HCL 50 MG/ML IJ SOLN: 50.0000 mg | Freq: Once | INTRAMUSCULAR | Status: AC

## 2022-03-16 MED ORDER — FAMOTIDINE 20 MG PO TABS
20.0000 mg | ORAL_TABLET | Freq: Every evening | ORAL | Status: DC | PRN
Start: 2022-03-16 — End: 2022-03-17

## 2022-03-16 MED ORDER — SODIUM CHLORIDE 0.9 % IV SOLN
INTRAVENOUS | Status: DC
Start: 2022-03-16 — End: 2022-03-17

## 2022-03-16 MED ORDER — APIXABAN 5 MG PO TABS
5.0000 mg | ORAL_TABLET | Freq: Two times a day (BID) | ORAL | Status: DC
  Administered 2022-03-16 – 2022-03-17 (×3): 5 mg via ORAL
  Filled 2022-03-16 (×3): qty 1

## 2022-03-16 MED ORDER — FERROUS SULFATE 325 (65 FE) MG PO TABS
325.0000 mg | ORAL_TABLET | Freq: Every day | ORAL | Status: DC
  Administered 2022-03-17: 325 mg via ORAL
  Filled 2022-03-16: qty 1

## 2022-03-16 NOTE — Progress Notes (Signed)
A consult was placed to IV Therapy for new access;  pt has "lost" 2 ivs since admission yesterday; pt is limited to Right arm only for all labs, ivs;  attempted x1 in RAFA with ultrasound; no other suitable veins noted in the forearm; pt has been on a heparin drip, which has been stopped due to bleeding; pt has a CT scan with contrast ordered; suggest a picc line for her , as peripheral veins are very fragile;  Have spoken with the RN who will notify MD.

## 2022-03-16 NOTE — Progress Notes (Signed)
Peripherally Inserted Central Catheter Placement  The IV Nurse has discussed with the patient and/or persons authorized to consent for the patient, the purpose of this procedure and the potential benefits and risks involved with this procedure.  The benefits include less needle sticks, lab draws from the catheter, and the patient K[place]ell be discharged home with the catheter. Risks include, but not limited to, infection, bleeding, blood clot (thrombus formation), and puncture of an artery; nerve damage and irregular heartbeat and possibility to perform a PICC exchange if needed/ordered by physician.  Alternatives to this procedure were also discussed.  Bard Power PICC patient education guide, fact sheet on infection prevention and patient information card has been provided to patient /or left at bedside.  PICC inserted by Enos Fling, RN   PICC Placement Documentation  PICC Single Lumen 60/04/59 Right Basilic 38 cm 0 cm (Active)  Indication for Insertion or Continuance of Line Poor Vasculature-patient has had multiple peripheral attempts or PIVs lasting less than 24 hours 03/16/22 1518  Exposed Catheter (cm) 0 cm 03/16/22 1518  Site Assessment Clean;Dry;Intact 03/16/22 1518  Line Status Flushed;Saline locked;Blood return noted 03/16/22 1518  Dressing Type Transparent;Securing device 03/16/22 1518  Dressing Status Antimicrobial disc in place 03/16/22 1518  Safety Lock Not Applicable 97/74/14 2395  Line Care Connections checked and tightened 03/16/22 1518  Dressing Intervention New dressing 03/16/22 1518  Dressing Change Due 03/23/22 03/16/22 [place], Nicolette Bang 03/16/2022, 3:18 PM

## 2022-03-16 NOTE — Progress Notes (Signed)
Secure chat with Dr. Doristine Bosworth regarding multiple PIV restarts and a L arm restriction. MD stated he was ok with no PIV for vein preservation.

## 2022-03-16 NOTE — Progress Notes (Signed)
Mobility Specialist Progress Note    03/16/22 1119  Mobility  Activity Ambulated with assistance in hallway  Level of Assistance Contact guard assist, steadying assist  Assistive Device Front wheel walker  Distance Ambulated (ft) 200 ft  Activity Response Tolerated well  $Mobility charge 1 Mobility   Pre-Mobility: 53 HR, 94% SpO2 During Mobility: 69 HR Post-Mobility: 63 HR, 92% SpO2  Pt received in bed and agreeable. C/o cramping foot pain, RN notified. Returned to bed with call bell in reach.    Hildred Alamin Mobility Specialist

## 2022-03-16 NOTE — Progress Notes (Signed)
Progress Note  Patient Name: [name] Date of Encounter: 03/16/2022  [facility] Cardiologist: Fransico Him, MD   Subjective   Intensity of chest discomfort is decreased although still present in a focal subxiphoid location.  Also complaining that the chest is tender in this area.  Inpatient Medications    Scheduled Meds:  amLODipine  5 mg Oral Daily   cloNIDine  0.1 mg Oral BID   losartan  100 mg Oral Daily   metoprolol succinate  25 mg Oral Daily   pantoprazole  40 mg Oral Daily   sodium chloride flush  3 mL Intravenous Q12H   spironolactone  25 mg Oral Daily   torsemide  80 mg Oral BID   Continuous Infusions:  heparin 1,200 Units/hr (03/16/22 1019)   PRN Meds: acetaminophen **OR** acetaminophen, clonazePAM, ondansetron **OR** ondansetron (ZOFRAN) IV, mouth rinse, senna-docusate   Vital Signs    Vitals:   03/15/22 2240 03/16/22 0030 03/16/22 0345 03/16/22 0728  BP:  (!) 125/41 (!) 122/40 (!) 139/44  Pulse:  (!) 48 66 67  Resp: '20 16 20 16  '$ Temp:  98.6 F (37 C) 98.1 F (36.7 C) 98.6 F (37 C)  TempSrc:  Oral Oral Oral  SpO2: 91% 96% 91% 95%  Weight:   75.8 kg   Height:        Intake/Output Summary (Last 24 hours) at 03/16/2022 1021 Last data filed at 03/16/2022 0700 Gross per 24 hour  Intake 389.1 ml  Output 2 ml  Net 387.1 ml      03/16/2022    3:45 AM 03/15/2022   12:12 PM 03/05/2022    9:43 AM  Last 3 Weights  Weight (lbs) 167 lb 171 lb 172 lb  Weight (kg) 75.751 kg 77.565 kg 78.019 kg      Telemetry    Atrial fibrillation with controlled rate- Personally Reviewed  ECG    A new tracing is not done today.  Ischemic changes were noted on the tracing from yesterday.- Personally Reviewed  Physical Exam  Sitting at bedside.  No complaints other than mild persisting retro-'s for discomfort and back discomfort GEN: No acute distress.   Neck: No JVD Cardiac: IIRR.  No rubs heard on auscultation. Respiratory: Clear to auscultation  bilaterally. GI: Soft, nontender, non-distended  MS: No edema; No deformity. Neuro:  Nonfocal  Psych: Normal affect   Labs    High Sensitivity Troponin:   Recent Labs  Lab 03/15/22 1213 03/15/22 1438 03/15/22 1721 03/15/22 2044  TROPONINIHS 143* 165* 184* 182*     Chemistry Recent Labs  Lab 03/15/22 1213 03/16/22 0658  NA 136 137  K 3.5 4.0  CL 96* 101  CO2 27 25  GLUCOSE 181* 110*  BUN 56* 53*  CREATININE 1.36* 1.38*  CALCIUM 9.3 9.4  MG 2.1  --   PROT 7.2  --   ALBUMIN 3.7  --   AST 20  --   ALT 11  --   ALKPHOS 68  --   BILITOT 0.6  --   GFRNONAA 38* 38*  ANIONGAP 13 11    Lipids No results for input(s): "CHOL", "TRIG", "HDL", "LABVLDL", "LDLCALC", "CHOLHDL" in the last 168 hours.  Hematology Recent Labs  Lab 03/15/22 1213 03/16/22 0658  WBC 10.0 8.3  RBC 3.28* 3.15*  HGB 10.1* 9.7*  HCT 31.7* 29.7*  MCV 96.6 94.3  MCH 30.8 30.8  MCHC 31.9 32.7  RDW 14.0 14.1  PLT 240 206   Thyroid No  results for input(s): "TSH", "FREET4" in the last 168 hours.  BNP Recent Labs  Lab 03/15/22 1213  BNP 120.6*    DDimer No results for input(s): "DDIMER" in the last 168 hours.   Radiology    DG Chest Portable 1 View  Result Date: 03/15/2022 CLINICAL DATA:  Central chest pain EXAM: PORTABLE CHEST 1 VIEW COMPARISON:  03/04/2021 FINDINGS: Stable enlarged cardiac silhouette. No effusion, infiltrate or pneumothorax. Atherosclerotic calcification of the aorta. No acute osseous abnormality. IMPRESSION: No acute cardiopulmonary process. Electronically Signed   By: Suzy Bouchard M.D.   On: 03/15/2022 13:05    Cardiac Studies   CT angio for PE 2019: ascending thoracic aorta to 4.3 cm in AP dimension  Patient Profile     87 y.o. female with a hx of persistent atrial fibrillation Eliquis nonobstructive CAD (left and right heart cath 12/2020, nonobstructive CAD), prior Takotsubo cardiomyopathy 2016 with recovered LV function, chronic diastolic heart failure,  hypertension, aortic stenosis, ascending aortic aneurysm (4.3 by CTA in 07/2015), pulmonary hypertension, bradycardia (not on AV nodal blocking agent) and history of breast cancer who is being seen 03/15/2022 for the evaluation of elevated troponin at the request of Dr. Doren Custard.     Echo in 8/22 showed EF up to 65-70%, mild LVH, normal RV, PASP 67, moderate RAE, trivial MR.  PYP scan in 9/22 was probably negative.   Assessment & Plan    Chest pain: Quality is not ischemic.  Could be inflammatory.  Rule out aortic.  Consider CT of the aorta to exclude progression of aneurysm noted on CT scan in 2019. Elevated troponin I: Elevated flat troponins do portend a increased risk of cardiac events although in the setting of relatively recent coronary angio and absence of EKG changes with pain now present greater than 36 hours, I would recommend observation unless there is a significant wall motion abnormality on echo.  [facility] will sign off.   Medication Recommendations: CT angio of the chest to rule out aortic aneurysm/dissection. Other recommendations (labs, testing, etc): 2D Doppler echo to look for regional wall motion abnormality. Follow up as an outpatient: As needed  For questions or updates, please contact [place] Please consult [email or web address] for contact info under        Signed, Sinclair Grooms, MD  03/16/2022, 10:21 AM

## 2022-03-16 NOTE — Progress Notes (Signed)
ANTICOAGULATION CONSULT NOTE   Pharmacy Consult for Heparin Indication: chest pain/ACS  Allergies  Allergen Reactions   Contrast Media [Iodinated Contrast Media] Hives and Other (See Comments)    Per pt strong burning sensation starting in chest radiating outward    Clonidine Derivatives Other (See Comments)    Throat dry   Statins Other (See Comments)    "bones hurt"   Sulfa Antibiotics Diarrhea    Tremors    Celebrex [Celecoxib] Rash   Isosorbide Nitrate Itching and Rash   Other Itching and Rash    Plastic and paper tape and heart monitor pads   Tape Itching and Rash    Red Where applied and will spread    Patient Measurements: Height: '5\' 3"'$  (160 cm) Weight: 75.8 kg (167 lb) IBW/kg (Calculated) : 52.4 Heparin Dosing Weight: 69 kg  Vital Signs: Temp: 98.6 F (37 C) (07/11 0728) Temp Source: Oral (07/11 0728) BP: 139/44 (07/11 0728) Pulse Rate: 67 (07/11 0728)  Labs: Recent Labs    03/15/22 1213 03/15/22 1438 03/15/22 1721 03/15/22 2044 03/16/22 0658  HGB 10.1*  --   --   --  9.7*  HCT 31.7*  --   --   --  29.7*  PLT 240  --   --   --  206  APTT  --   --   --   --  43*  LABPROT 17.3*  --   --   --   --   INR 1.4*  --   --   --   --   HEPARINUNFRC  --   --   --   --  >1.10*  CREATININE 1.36*  --   --   --  1.38*  TROPONINIHS 143* 165* 184* 182*  --      Estimated Creatinine Clearance: 29.1 mL/min (A) (by C-G formula based on SCr of 1.38 mg/dL (H)).   Medical History: Past Medical History:  Diagnosis Date   Allergy    rhinitis   Aortic stenosis    mild by echo 06/2017   Arthritis    Bradycardia    a. 10/2017 -> beta blocker cut back due to HR 39.   Breast cancer ([place]) 01/06/2012   Cancer ([place])    right colon and left breast   Chronic diastolic CHF (congestive heart failure) ([place])    Colon cancer ([place]) 01/06/2012   Colovesical fistula    Dr. Marlou Starks and [name] planning surgery 08/2018- surgery revealed spontaneous closure   COPD (chronic  obstructive pulmonary disease) ([place])    pt. denies   Coronary artery disease 2006   a.  NSTEMI in 2016, cath showed 15% prox-mid RCA, 20% prox LAD, EF 25-35% by cath and 35-40% -> felt due to Takotsubo cardiomyopathy.   Dilated aortic root ([place])    58mHg by echo 06/2017   Diverticulosis    Dyspnea    Edema extremities    GERD (gastroesophageal reflux disease)    Hernia    Hiatal hernia    denies   Hyperlipidemia    Hypertension    Mild aortic stenosis    echo 11/2015 but not noted on echo 06/2016   Osteopenia    Permanent atrial fibrillation (HCC)    chronic atrial fibrillation   Pneumonia    hx child   Pulmonary HTN ([place]    a. moderate to severe PASP 62mg echo 11/2015 - now 4128m by echo 06/2017. CTA chest in 11/16 with no PE. PFTs in 7/15  with mild obstructive lung disease. She had a negative sleep study in 2017. b. Felt due to left sided HF.   Stroke [facility])    Takotsubo syndrome 07/29/2015   a. EF 35-40% by echo; akinesis of mid-apical anteroseptal and apical myocardium.  EF now normalized on echo 11/2015     Assessment: 87 y.o. F presents with CP. Pt on apixaban PTA for afib. Last dose '5mg'$  taken 7/10 about 8am . On heparin per pharmacy for r/o ACS. Apixaban will be affecting heparin levels so will utilize aPTT until levels are correlating. -aPTT= 43 on heparin 950 units/hr, hhg low/stable  Goal of Therapy:  Heparin level 0.3-0.7 units/ml, aPTT 66-102 sec Monitor platelets by anticoagulation protocol: Yes   Plan:  -Increase heparin to 1200 units/hr -aPTT in 8 hours and daily wth CBC daily  Hildred Laser, PharmD Clinical Pharmacist **Pharmacist phone directory can now be found on [email or web address] ([email or web address]).  Listed under [place].

## 2022-03-16 NOTE — Progress Notes (Signed)
PROGRESS NOTE    [name]  TTS:[id number] DOB: May 11, 1935 DOA: 03/15/2022 PCP: [name], MD   Brief Narrative:  [name] is a 87 y.o. female with medical history significant for persistent atrial fibrillation on Eliquis, nonobstructive CAD, history of Takotsubo cardiomyopathy with recovered EF, HFpEF (EF 65-70% by TTE 04/28/2021), CKD stage IIIb, pulmonary hypertension, COPD, hypertension who presented to the ED for evaluation of chest pain.  Patient reports new onset of substernal chest/epigastric pain beginning shortly after midnight 03/15/2022.  This occurred when she got up to use the bathroom.  Pain has been relatively constant in nature since then.  She reports intermittent sharp stabbing type sensation.  She has no change in discomfort on position.  Pain is worse with deep inspiration.  She reports chronic intermittent cough occasionally productive of white sputum.   ED Course  Labs/Imaging on admission: I have personally reviewed following labs and imaging studies.   Initial vitals showed BP 159/54, pulse 69, RR 18, temp 98.4 F, SPO2 95% on room air.   Labs show sodium 136, potassium 3.5, bicarb 27, BUN 56, creatinine 1.36, serum glucose 181, LFTs within normal limits, magnesium 2.1, lipase 51, BNP 120.6.  Troponin 143 > 165 > 184.   Portable chest x-ray negative for focal consolidation, edema, effusion.   Cardiology was consulted.  Patient was started on IV heparin.  Hospitalist service was consulted to admit for further evaluation and management.  Assessment & Plan:   Principal Problem:   Chest pain Active Problems:   Persistent atrial fibrillation (HCC)   Hypertension   Chronic diastolic CHF (congestive heart failure) (HCC)   Chronic kidney disease, stage 3b (HCC)   Elevated troponin  Chest pain/epigastric pain/elevated troponin: Atypical in nature, also she is pointing to the epigastric area and she is tender to palpation on the epigastric and central chest  area as well.  Troponin elevated but flat, she was started on heparin drip per cardiology recommendations.  Cardiology has seen her today.  They do not believe she has NSTEMI.   I personally communicated with the cardiologist over the secure chat and recommendations are to stop heparin drip, will discontinue that.  Cardiology suspects possible aortic dissection due to her having an aortic aneurysm in 2019 however per my discussion with Dr. Tamala Julian, his suspicion is very low. I received a message from the nurse that patient has lost to peripheral IVs since admission and they could not find any other IV line and she is not a good candidate for midline so only option left is PICC line, I have placed PICC line placement ordered per IV team's request.  Chronic kidney disease, stage 3b ([place]) Renal function stable.  Continue to monitor.   Chronic diastolic CHF (congestive heart failure) (HCC) Euvolemic on admission.  EF 65-70% by TTE 04/28/2021.  Has associated pulmonary hypertension. -Continue home torsemide 80 mg twice daily -Monitor strict I/O's and daily weights -Update echocardiogram as above  History of persistent atrial fibrillation: Will discontinue heparin now and resume back on Eliquis.  Continue Toprol-XL.   Hypertension Continue home amlodipine, clonidine, losartan, Toprol-XL, spironolactone, torsemide.  DVT prophylaxis:   Now that we are discontinuing heparin, resuming Eliquis.   Code Status: Full Code  Family Communication:  None present at bedside.  Plan of care discussed with patient in length and he/she verbalized understanding and agreed with it.  Status is: Observation The patient will require care spanning > 2 midnights and should be moved to inpatient  because: Patient needs CTA per cardiology recommendations.   Estimated body mass index is 29.58 kg/m as calculated from the following:   Height as of this encounter: '5\' 3"'$  (1.6 m).   Weight as of this encounter: 75.8 kg.     Nutritional Assessment: Body mass index is 29.58 kg/m.Marland Kitchen Seen by dietician.  I agree with the assessment and plan as outlined below: Nutrition Status:        . Skin Assessment: I have examined the patient's skin and I agree with the wound assessment as performed by the wound care RN as outlined below:    Consultants:  Cardiology  Procedures:  None  Antimicrobials:  Anti-infectives (From admission, onward)    None         Subjective: Patient seen and examined.  She states that she still has abdominal pain but it is much milder, 2-3 out of 10 compared to 9 out of 10 yesterday and she also complains of epigastric pain.  No other complaint.  She has chronic shortness of breath which is at baseline.  Objective: Vitals:   03/16/22 0030 03/16/22 0345 03/16/22 0728 03/16/22 1057  BP: (!) 125/41 (!) 122/40 (!) 139/44 (!) 139/52  Pulse: (!) 48 66 67 (!) 59  Resp: '16 20 16 16  '$ Temp: 98.6 F (37 C) 98.1 F (36.7 C) 98.6 F (37 C) 98.4 F (36.9 C)  TempSrc: Oral Oral Oral Oral  SpO2: 96% 91% 95% 94%  Weight:  75.8 kg    Height:        Intake/Output Summary (Last 24 hours) at 03/16/2022 1357 Last data filed at 03/16/2022 0700 Gross per 24 hour  Intake 389.1 ml  Output 2 ml  Net 387.1 ml   Filed Weights   03/15/22 1212 03/16/22 0345  Weight: 77.6 kg 75.8 kg    Examination:  General exam: Appears calm and comfortable  Respiratory system: Clear to auscultation. Respiratory effort normal. Cardiovascular system: S1 & S2 heard, RRR. No JVD, murmurs, rubs, gallops or clicks. No pedal edema.  Point tenderness at the anterior chest. Gastrointestinal system: Abdomen is nondistended, soft and moderate epigastric tenderness. No organomegaly or masses felt. Normal bowel sounds heard. Central nervous system: Alert and oriented. No focal neurological deficits. Extremities: Symmetric 5 x 5 power. Skin: No rashes, lesions or ulcers Psychiatry: Judgement and insight appear  normal. Mood & affect appropriate.    Data Reviewed: I have personally reviewed following labs and imaging studies  CBC: Recent Labs  Lab 03/15/22 1213 03/16/22 0658  WBC 10.0 8.3  NEUTROABS 8.0*  --   HGB 10.1* 9.7*  HCT 31.7* 29.7*  MCV 96.6 94.3  PLT 240 761   Basic Metabolic Panel: Recent Labs  Lab 03/15/22 1213 03/16/22 0658  NA 136 137  K 3.5 4.0  CL 96* 101  CO2 27 25  GLUCOSE 181* 110*  BUN 56* 53*  CREATININE 1.36* 1.38*  CALCIUM 9.3 9.4  MG 2.1  --    GFR: Estimated Creatinine Clearance: 29.1 mL/min (A) (by C-G formula based on SCr of 1.38 mg/dL (H)). Liver Function Tests: Recent Labs  Lab 03/15/22 1213  AST 20  ALT 11  ALKPHOS 68  BILITOT 0.6  PROT 7.2  ALBUMIN 3.7   Recent Labs  Lab 03/15/22 1213  LIPASE 51   No results for input(s): "AMMONIA" in the last 168 hours. Coagulation Profile: Recent Labs  Lab 03/15/22 1213  INR 1.4*   Cardiac Enzymes: No results for input(s): "CKTOTAL", "CKMB", "CKMBINDEX", "  TROPONINI" in the last 168 hours. BNP (last 3 results) No results for input(s): "PROBNP" in the last 8760 hours. HbA1C: No results for input(s): "HGBA1C" in the last 72 hours. CBG: No results for input(s): "GLUCAP" in the last 168 hours. Lipid Profile: No results for input(s): "CHOL", "HDL", "LDLCALC", "TRIG", "CHOLHDL", "LDLDIRECT" in the last 72 hours. Thyroid Function Tests: No results for input(s): "TSH", "T4TOTAL", "FREET4", "T3FREE", "THYROIDAB" in the last 72 hours. Anemia Panel: No results for input(s): "VITAMINB12", "FOLATE", "FERRITIN", "TIBC", "IRON", "RETICCTPCT" in the last 72 hours. Sepsis Labs: No results for input(s): "PROCALCITON", "LATICACIDVEN" in the last 168 hours.  Recent Results (from the past 240 hour(s))  MRSA Next Gen by PCR, Nasal     Status: None   Collection Time: 03/15/22 10:36 PM   Specimen: Nasal Mucosa; Nasal Swab  Result Value Ref Range Status   MRSA by PCR Next Gen NOT DETECTED NOT DETECTED  Final    Comment: (NOTE) The GeneXpert MRSA Assay (FDA approved for NASAL specimens only), is one component of a comprehensive MRSA colonization surveillance program. It is not intended to diagnose MRSA infection nor to guide or monitor treatment for MRSA infections. Test performance is not FDA approved in patients less than 49 years old. Performed at [facility], [place] [address] [place], [place] [address]      Radiology Studies: [place] EKG SITE RITE  Result Date: 03/16/2022 If Site Rite image not attached, placement could not be confirmed due to current cardiac rhythm.  DG Chest Portable 1 View  Result Date: 03/15/2022 CLINICAL DATA:  Central chest pain EXAM: PORTABLE CHEST 1 VIEW COMPARISON:  03/04/2021 FINDINGS: Stable enlarged cardiac silhouette. No effusion, infiltrate or pneumothorax. Atherosclerotic calcification of the aorta. No acute osseous abnormality. IMPRESSION: No acute cardiopulmonary process. Electronically Signed   By: Suzy Bouchard M.D.   On: 03/15/2022 13:05    Scheduled Meds:  amLODipine  5 mg Oral Daily   cloNIDine  0.1 mg Oral BID   [START ON 03/17/2022] diphenhydrAMINE  50 mg Oral Once   Or   [START ON 03/17/2022] diphenhydrAMINE  50 mg Intravenous Once   losartan  100 mg Oral Daily   metoprolol succinate  25 mg Oral Daily   pantoprazole  40 mg Oral Daily   predniSONE  50 mg Oral Q6H   sodium chloride flush  3 mL Intravenous Q12H   spironolactone  25 mg Oral Daily   torsemide  80 mg Oral BID   Continuous Infusions:  sodium chloride       LOS: 0 days   Darliss Cheney, MD [facility]  03/16/2022, 1:57 PM   *Please note that this is a verbal dictation therefore any spelling or grammatical errors are due to the "[place]" system interpretation.  Please page via [place] and do not message via secure chat for urgent patient care matters. Secure chat can be used for non urgent patient care matters.  How to contact the [facility] Attending or  Consulting provider [place] or covering provider during after hours [place], for this patient?  Check the care team in [facility] and look for a) attending/consulting TRH provider listed and b) the [facility] team listed. Page or secure chat [phone]. Log into [email or web address] and use [place]'s universal password to access. If you do not have the password, please contact the hospital operator. Locate the [facility] provider you are looking for under [facility] and page to a number that you can be directly  reached. If you still have difficulty reaching the provider, please page the [facility] (Director on Call) for the Hospitalists listed on amion for assistance.

## 2022-03-17 ENCOUNTER — Observation Stay (HOSPITAL_BASED_OUTPATIENT_CLINIC_OR_DEPARTMENT_OTHER): Payer: Medicare Other

## 2022-03-17 ENCOUNTER — Observation Stay (HOSPITAL_COMMUNITY): Payer: Medicare Other

## 2022-03-17 DIAGNOSIS — I248 Other forms of acute ischemic heart disease: Secondary | ICD-10-CM | POA: Diagnosis not present

## 2022-03-17 DIAGNOSIS — R778 Other specified abnormalities of plasma proteins: Secondary | ICD-10-CM | POA: Diagnosis not present

## 2022-03-17 DIAGNOSIS — R911 Solitary pulmonary nodule: Secondary | ICD-10-CM | POA: Diagnosis not present

## 2022-03-17 DIAGNOSIS — I10 Essential (primary) hypertension: Secondary | ICD-10-CM

## 2022-03-17 DIAGNOSIS — J984 Other disorders of lung: Secondary | ICD-10-CM | POA: Diagnosis not present

## 2022-03-17 DIAGNOSIS — R079 Chest pain, unspecified: Secondary | ICD-10-CM | POA: Diagnosis not present

## 2022-03-17 DIAGNOSIS — I5032 Chronic diastolic (congestive) heart failure: Secondary | ICD-10-CM | POA: Diagnosis not present

## 2022-03-17 DIAGNOSIS — I214 Non-ST elevation (NSTEMI) myocardial infarction: Secondary | ICD-10-CM | POA: Diagnosis not present

## 2022-03-17 DIAGNOSIS — N1832 Chronic kidney disease, stage 3b: Secondary | ICD-10-CM

## 2022-03-17 DIAGNOSIS — E041 Nontoxic single thyroid nodule: Secondary | ICD-10-CM | POA: Diagnosis not present

## 2022-03-17 LAB — ECHOCARDIOGRAM COMPLETE
AR max vel: 2.36 cm2
AV Area VTI: 2.36 cm2
AV Area mean vel: 2.07 cm2
AV Mean grad: 7 mmHg
AV Peak grad: 12.4 mmHg
Ao pk vel: 1.76 m/s
Area-P 1/2: 3.63 cm2
Height: 63 in
MV VTI: 2.05 cm2
S' Lateral: 2.6 cm
Weight: 2701.96 oz

## 2022-03-17 LAB — BASIC METABOLIC PANEL
Anion gap: 11 (ref 5–15)
BUN: 48 mg/dL — ABNORMAL HIGH (ref 8–23)
CO2: 28 mmol/L (ref 22–32)
Calcium: 9.4 mg/dL (ref 8.9–10.3)
Chloride: 96 mmol/L — ABNORMAL LOW (ref 98–111)
Creatinine, Ser: 1.22 mg/dL — ABNORMAL HIGH (ref 0.44–1.00)
GFR, Estimated: 43 mL/min — ABNORMAL LOW (ref >60)
Glucose, Bld: 172 mg/dL — ABNORMAL HIGH (ref 70–99)
Potassium: 4.3 mmol/L (ref 3.5–5.1)
Sodium: 135 mmol/L (ref 135–145)

## 2022-03-17 LAB — CBC WITH DIFFERENTIAL/PLATELET
Abs Immature Granulocytes: 0.03 10*3/uL (ref 0.00–0.07)
Basophils Absolute: 0 10*3/uL (ref 0.0–0.1)
Basophils Relative: 0 %
Eosinophils Absolute: 0 10*3/uL (ref 0.0–0.5)
Eosinophils Relative: 0 %
HCT: 31.8 % — ABNORMAL LOW (ref 36.0–46.0)
Hemoglobin: 10.2 g/dL — ABNORMAL LOW (ref 12.0–15.0)
Immature Granulocytes: 0 %
Lymphocytes Relative: 8 %
Lymphs Abs: 0.6 10*3/uL — ABNORMAL LOW (ref 0.7–4.0)
MCH: 30.3 pg (ref 26.0–34.0)
MCHC: 32.1 g/dL (ref 30.0–36.0)
MCV: 94.4 fL (ref 80.0–100.0)
Monocytes Absolute: 0.2 10*3/uL (ref 0.1–1.0)
Monocytes Relative: 3 %
Neutro Abs: 6.1 10*3/uL (ref 1.7–7.7)
Neutrophils Relative %: 89 %
Platelets: 237 10*3/uL (ref 150–400)
RBC: 3.37 MIL/uL — ABNORMAL LOW (ref 3.87–5.11)
RDW: 13.7 % (ref 11.5–15.5)
WBC: 6.9 10*3/uL (ref 4.0–10.5)
nRBC: 0 % (ref 0.0–0.2)

## 2022-03-17 MED ORDER — PANTOPRAZOLE SODIUM 40 MG PO TBEC: 40.0000 mg | DELAYED_RELEASE_TABLET | Freq: Two times a day (BID) | ORAL | 0 refills | Status: DC

## 2022-03-17 MED ORDER — IOHEXOL 350 MG/ML SOLN
75.0000 mL | Freq: Once | INTRAVENOUS | Status: AC | PRN
  Administered 2022-03-17: 75 mL via INTRAVENOUS

## 2022-03-17 NOTE — Care Management Obs Status (Signed)
[place] NOTIFICATION   Patient Details  Name: [name] MRN: [id number] Date of Birth: [birth year]   Medicare Observation Status Notification Given:  Yes    Angelita Ingles, RN 03/17/2022, 8:52 AM

## 2022-03-17 NOTE — TOC Transition Note (Signed)
Transition of Care [facility]) - CM/SW Discharge Note   Patient Details  Name: [name] MRN: [id number] Date of Birth: 1935/04/08  Transition of [facility]) CM/SW Contact:  Angelita Ingles, RN Phone [phone]  03/17/2022, 1:35 PM   Clinical Narrative:    Patient is discharging home with Home health in place with [facility]. Cory with [facility] updated on orders for PT/OT and made aware of patients discharge today.          Patient Goals and CMS Choice        Discharge Placement                       Discharge Plan and Services                                     Social Determinants of Health (SDOH) Interventions     Readmission Risk Interventions     No data to display

## 2022-03-17 NOTE — Evaluation (Signed)
Occupational Therapy Evaluation Patient Details Name: [name] MRN: [id number] DOB: 03-12-1935 Today's Date: 03/17/2022   History of Present Illness Pt is an 87 y/o female admitted secondary to chest pain. PMH includes HTN, dCHF, a fib, and CKD.   Clinical Impression   Pt admitted for concerns listed above. PTA pt reported that she was independent with all ADL's and functional mobility. At this time she presents with increased weakness, requiring min guard for safety. She continues to be able to complete ADL's with increased time. Recommending HHOT once home. Pt has no further acute OT needs and OT will sign off.       Recommendations for follow up therapy are one component of a multi-disciplinary discharge planning process, led by the attending physician.  Recommendations Luther be updated based on patient status, additional functional criteria and insurance authorization.   Follow Up Recommendations  Home health OT    Assistance Recommended at Discharge Intermittent Supervision/Assistance  Patient can return home with the following A little help with walking and/or transfers;A little help with bathing/dressing/bathroom    Functional Status Assessment  Patient has had a recent decline in their functional status and demonstrates the ability to make significant improvements in function in a reasonable and predictable amount of time.  Equipment Recommendations  None recommended by OT    Recommendations for Other Services       Precautions / Restrictions Precautions Precautions: Fall Restrictions Weight Bearing Restrictions: No      Mobility Bed Mobility               General bed mobility comments: Up EOB on entry    Transfers Overall transfer level: Needs assistance Equipment used: None Transfers: Sit to/from Stand Sit to Stand: Min guard           General transfer comment: Min guard from bed to transport chair and bed to bathroom      Balance Overall balance  assessment: Needs assistance Sitting-balance support: No upper extremity supported, Feet supported Sitting balance-Leahy Scale: Good     Standing balance support: No upper extremity supported Standing balance-Leahy Scale: Fair                             ADL either performed or assessed with clinical judgement   ADL Overall ADL's : Modified independent                                       General ADL Comments: NEeds increased time to due to weakness     Vision Baseline Vision/History: 1 Wears glasses Ability to See in Adequate Light: 0 Adequate Patient Visual Report: No change from baseline Vision Assessment?: No apparent visual deficits     Perception     Praxis      Pertinent Vitals/Pain Pain Assessment Pain Assessment: No/denies pain     Hand Dominance Right   Extremity/Trunk Assessment Upper Extremity Assessment Upper Extremity Assessment: Generalized weakness   Lower Extremity Assessment Lower Extremity Assessment: Generalized weakness RLE Deficits / Details: Reports R knee pain at baseline   Cervical / Trunk Assessment Cervical / Trunk Assessment: Normal   Communication Communication Communication: No difficulties   Cognition Arousal/Alertness: Awake/alert Behavior During Therapy: WFL for tasks assessed/performed Overall Cognitive Status: Within Functional Limits for tasks assessed  General Comments  VSS on RA    Exercises     Shoulder Instructions      Home Living Family/patient expects to be discharged to:: Private residence Living Arrangements: Alone Available Help at Discharge: Family Type of Home: House Home Access: Level entry     Home Layout: One level     Bathroom Shower/Tub: Occupational psychologist: Standard     Home Equipment: Other (comment);Shower seat;Rollator (4 wheels) (grab bars over toilet)          Prior  Functioning/Environment Prior Level of Function : Independent/Modified Independent               ADLs Comments: Reports having to sponge bathe some days as she is nervous about getting into shower alone        OT Problem List: Decreased strength;Decreased activity tolerance;Impaired balance (sitting and/or standing);Cardiopulmonary status limiting activity      OT Treatment/Interventions:      OT Goals(Current goals can be found in the care plan section) Acute Rehab OT Goals Patient Stated Goal: To go home OT Goal Formulation: With patient Time For Goal Achievement: 03/17/22 Potential to Achieve Goals: Good  OT Frequency:      Co-evaluation              AM-PAC OT "6 Clicks" Daily Activity     Outcome Measure Help from another person eating meals?: None Help from another person taking care of personal grooming?: None Help from another person toileting, which includes using toliet, bedpan, or urinal?: A Little Help from another person bathing (including washing, rinsing, drying)?: None Help from another person to put on and taking off regular upper body clothing?: None Help from another person to put on and taking off regular lower body clothing?: A Little 6 Click Score: 22   End of Session Nurse Communication: Mobility status  Activity Tolerance: Patient tolerated treatment well Patient left: Other (comment) (Leaving with transport to discharge)  OT Visit Diagnosis: Unsteadiness on feet (R26.81);Other abnormalities of gait and mobility (R26.89);Muscle weakness (generalized) (M62.81)                Time: 9672-8979 OT Time Calculation (min): 8 min Charges:  OT General Charges $OT Visit: 1 Visit OT Evaluation $OT Eval Low Complexity: [place]., OTR/L Acute Rehabilitation  Kevontay Burks Elane Yolanda Bonine 03/17/2022, 1:55 PM

## 2022-03-17 NOTE — Discharge Summary (Signed)
PatientPhysician Discharge Summary  [name] YBO:[id number] DOB: 01/12/1935 DOA: 03/15/2022  PCP: [name], MD  Admit date: 03/15/2022 Discharge date: 03/17/2022    Admitted From: Home Disposition: Home  Recommendations for Outpatient Follow-up:  Follow up with PCP in 1-2 weeks Please obtain BMP/CBC in one week Follow-up with your primary cardiologist in 2 to 4 weeks Please follow up with your PCP on the following pending results: Unresulted Labs (From admission, onward)     Start     Ordered   03/16/22 0500  CBC  Daily at 5am,   R      03/15/22 [place]: Yes Equipment/Devices: None  Discharge Condition: Stable CODE STATUS: Full code Diet recommendation: Cardiac  Subjective: Seen and examined.  She states that she feels better.  She has very minimal epigastric pain and has very minimal epigastric tenderness on exam as well.  No shortness of breath other than her usual.  She feels comfortable going home.  Brief/Interim Summary: [name] is a 87 y.o. female with medical history significant for persistent atrial fibrillation on Eliquis, nonobstructive CAD, history of Takotsubo cardiomyopathy with recovered EF, HFpEF (EF 65-70% by TTE 04/28/2021), CKD stage IIIb, pulmonary hypertension, COPD, hypertension who presented to the ED for evaluation of chest pain.  Patient reports new onset of substernal chest/epigastric pain beginning shortly after midnight 03/15/2022.  This occurred when she got up to use the bathroom.  Pain has been relatively constant in nature since then.  She reports intermittent sharp stabbing type sensation.  She has no change in discomfort on position.  Pain is worse with deep inspiration.  She reports chronic intermittent cough occasionally productive of white sputum.  Initial vitals showed BP 159/54, pulse 69, RR 18, temp 98.4 F, SPO2 95% on room air.   Labs show sodium 136, potassium 3.5, bicarb 27, BUN 56, creatinine 1.36,  serum glucose 181, LFTs within normal limits, magnesium 2.1, lipase 51, BNP 120.6.  Troponin 143 > 165 > 184.   Portable chest x-ray negative for focal consolidation, edema, effusion.   Cardiology was consulted.  Patient was started on IV heparin.  Hospitalist service was consulted to admit for further evaluation and management.   Chest pain/epigastric pain/elevated troponin: Atypical in nature, also she is pointing to the epigastric area and she is tender to palpation on the epigastric and central chest area as well.  Troponin elevated but flat, she was started on heparin drip per cardiology recommendations.  Cardiology then saw her, they do not believe she has NSTEMI and recommended stopping heparin and they ordered CTA of the chest to rule out aortic dissection and fortunately, her aortic aneurysm was 4 cm, no different than how it was in 2019.  Patient's pain has improved.  She has point tenderness at the epigastrium as well as at the chest suggesting possible musculoskeletal as well as GI pain.  She was already taking famotidine and omeprazole.  I am discharging her on increasing dose of omeprazole to twice daily.  Cardiology had already cleared her yesterday and they signed off.  Patient is also agreeable with this discharge plan.  She was assessed by PT OT who recommended home health which we will arrange.  Chronic kidney disease, stage 3b ([place]) Renal function stable.  Continue to monitor.   Chronic diastolic CHF (congestive heart failure) (HCC) Euvolemic on admission.  EF 65-70% by TTE 04/28/2021.  Has associated  pulmonary hypertension. -Continue home torsemide 80 mg twice daily -Monitor strict I/O's and daily weights -Echo is completed, results pending.  I will update discharge summary if there will be any change in the echo.   History of persistent atrial fibrillation: Will discontinue heparin now and resume back on Eliquis.  Continue Toprol-XL.   Hypertension Continue home amlodipine,  clonidine, losartan, Toprol-XL, spironolactone, torsemide.  Discharge plan was discussed with patient and/or family member and they verbalized understanding and agreed with it.  Discharge Diagnoses:  Principal Problem:   Chest pain Active Problems:   Persistent atrial fibrillation (HCC)   Hypertension   Chronic diastolic CHF (congestive heart failure) (HCC)   Chronic kidney disease, stage 3b (HCC)   Elevated troponin    Discharge Instructions   Allergies as of 03/17/2022       Reactions   Contrast Media [iodinated Contrast Media] Hives, Other (See Comments)   Per pt strong burning sensation starting in chest radiating outward   Clonidine Derivatives Other (See Comments)   Throat dry   Statins Other (See Comments)   "bones hurt"   Sulfa Antibiotics Diarrhea   Tremors    Celebrex [celecoxib] Rash   Isosorbide Nitrate Itching, Rash   Other Itching, Rash   Plastic and paper tape and heart monitor pads   Tape Itching, Rash   Red Where applied and will spread        Medication List     TAKE these medications    acetaminophen 500 MG tablet Commonly known as: TYLENOL Take 1,000 mg by mouth every 6 (six) hours as needed for headache or moderate pain.   amLODipine 5 MG tablet Commonly known as: NORVASC Take 1 tablet (5 mg total) by mouth daily.   Azelastine HCl 137 MCG/SPRAY Soln Place 1 spray into both nostrils 2 (two) times daily as needed (congestion).   clobetasol ointment 0.05 % Commonly known as: TEMOVATE Apply 1 Application topically daily as needed (irritation).   clonazePAM 0.5 MG tablet Commonly known as: KLONOPIN Take 1 tablet by mouth twice daily as needed for anxiety What changed:  reasons to take this additional instructions   cloNIDine 0.1 MG tablet Commonly known as: CATAPRES Take 1 tablet (0.1 mg total) by mouth 2 (two) times daily.   Eliquis 5 MG Tabs tablet Generic drug: apixaban Take 1 tablet (5 mg total) by mouth 2 (two) times daily.    famotidine 20 MG tablet Commonly known as: PEPCID Take 20 mg by mouth at bedtime as needed for heartburn.   ferrous sulfate 325 (65 FE) MG tablet Take 1 tablet (325 mg total) by mouth daily with breakfast.   fluticasone 50 MCG/ACT nasal spray Commonly known as: FLONASE Place 1 spray into both nostrils daily as needed for allergies or rhinitis.   losartan 100 MG tablet Commonly known as: COZAAR Take 1 tablet by mouth once daily   metoprolol succinate 25 MG 24 hr tablet Commonly known as: TOPROL-XL Take 25 mg by mouth daily.   nitroGLYCERIN 0.4 MG SL tablet Commonly known as: NITROSTAT DISSOLVE ONE TABLET UNDER THE TONGUE EVERY 5 MINUTES AS NEEDED FOR CHEST PAIN.  DO NOT EXCEED A TOTAL OF 3 DOSES IN 15 MINUTES What changed:  how much to take how to take this when to take this reasons to take this additional instructions   pantoprazole 40 MG tablet Commonly known as: PROTONIX Take 1 tablet (40 mg total) by mouth 2 (two) times daily. What changed: when to take this  spironolactone 25 MG tablet Commonly known as: ALDACTONE Take 1 tablet by mouth once daily   torsemide 20 MG tablet Commonly known as: DEMADEX Take 4 tablets (80 mg total) by mouth 2 (two) times daily.   Vitamin D3 25 MCG (1000 UT) Caps Take 1,000 Units by mouth once a week.        Follow-up Information     [facility] Follow up.   Specialty: Home Health Services Why: Your home health has been set up with [facility]. The office will call you with start of service information. If you have any questions or concerns please call the number listed above. Contact information: [place] [address] [phone]         [name], MD Follow up in 1 week(s).   Specialty: Family Medicine Contact information: [address] [place] [address] [phone]         [name], MD .   Specialty: Cardiology Contact information: [phone] [address] [place] [address] [phone]         Larey Dresser, MD .   Specialty: Cardiology Contact information: [phone] [place] [address] [phone]                Allergies  Allergen Reactions   Contrast Media [Iodinated Contrast Media] Hives and Other (See Comments)    Per pt strong burning sensation starting in chest radiating outward    Clonidine Derivatives Other (See Comments)    Throat dry   Statins Other (See Comments)    "bones hurt"   Sulfa Antibiotics Diarrhea    Tremors    Celebrex [Celecoxib] Rash   Isosorbide Nitrate Itching and Rash   Other Itching and Rash    Plastic and paper tape and heart monitor pads   Tape Itching and Rash    Red Where applied and will spread    Consultations: Cardiology   Procedures/Studies: CT ANGIO CHEST AORTA W/CM & OR WO/CM  Result Date: 03/17/2022 CLINICAL DATA:  Acute aortic syndrome suspected. Re-evaluate for aneurysmal progression compared with 09/05/2018 EXAM: CT ANGIOGRAPHY CHEST WITH CONTRAST TECHNIQUE: Multidetector CT imaging of the chest was performed using the standard protocol during bolus administration of intravenous contrast. Multiplanar CT image reconstructions and MIPs were obtained to evaluate the vascular anatomy. RADIATION DOSE REDUCTION: This exam was performed according to the departmental dose-optimization program which includes automated exposure control, adjustment of the mA and/or kV according to patient size and/or use of iterative reconstruction technique. CONTRAST:  22m OMNIPAQUE IOHEXOL 350 MG/ML SOLN COMPARISON:  CTA chest of 09/05/2018 FINDINGS: Cardiovascular: The pulmonary arteries are normal in caliber without appreciable thromboembolic filling defects. However, the pulmonary trunk is upper-normal caliber 3.2 cm. With both main pulmonary arteries approaching 3 cm. Also there is moderate cardiomegaly again noted with a right chamber predominance  and RV/LV ratio again measuring 1.34 with IVC and hepatic vein reflux, indicating right heart dysfunction and/or tricuspid regurgitation. There are three-vessel coronary artery calcifications. There is no pericardial effusion. Near circumferential calcification in the mitral ring. The pulmonary veins are also prominent centrally more so superiorly. There is moderate aortic atherosclerosis. There are scattered plaques in the great vessels, for the most part are nonstenosing, but calcific plaque at the right subclavian artery origin does cause at least a 70% vessel origin stenosis. The ascending aorta measures ectatic at 4.0 cm, as remeasured by me today on  the prior study was also 4.0 cm with no interval change. Rest of the thoracic aorta is within normal caliber limits with mild descending tortuosity. There is no dissection or aneurysm. No stenosis. Right PICC terminates in the distal SVC. Mediastinum/Nodes: Heterogeneous solid nodule of the posterior aspect of the left lobe of the thyroid gland measuring 3.0 cm, was previously 2.3 cm. There is a heterogeneous nodule in the right lower pole measuring 1.2 cm which was seen previously. Additional right lobe nodule in the upper pole is 1.6 cm, and is probably also slightly larger. Please correlate with prior biopsy results for possible significance or need for follow-up. No thyroid ultrasound is seen on this patient since 04/02/2014. There is no intrathoracic or axillary adenopathy. There is a small hiatal hernia. The trachea is patent. There are chondral calcifications in the trachea and main bronchi. Lungs/Pleura: There is chronic pleural-parenchymal scarring in the lung apices. There is a calcified granuloma in the right upper lobe base. Paraspinal scarring in the medial right lower lobe is seen adjacent to thoracic spine osteophytes. There are scattered scar-like opacities in both bases. There is an irregular 9.5 mm nodule newly noted in the medial base of the  right middle lobe merging with coarse scar-like opacities around it. There is also a new irregular noncalcified left lower lobe nodule in the lateral basal segment on 5:77 measuring 9.3 x 8 mm. Remainder of the lungs clear. There is no pleural effusion, thickening or pneumothorax. Upper Abdomen: The liver is steatotic with capsular nodularity in the left lobe compatible with cirrhosis. There is no splenomegaly. There are numerous calcified splenic granulomas. The hepatic portal vein is not dilated. No acute abnormality. Scattered calcified liver granulomas. Musculoskeletal: Chronic moderate T12 compression fracture along the lower plate. There is osteopenia and degenerative change of the spine. No destructive bone lesions. The ribcage is intact. Review of the MIP images confirms the above findings. IMPRESSION: 1. 4.0 cm ectasia of the ascending aorta is stable since the prior study of 09/05/2018. 2. Aortic atherosclerosis with calcifications causing at least a 70% right subclavian artery origin stenosis. 3. Coronary artery calcifications with near circumferential calcification in the mitral ring. 4. Cardiomegaly with a right chamber predominance, IVC and hepatic vein reflux consistent with likely chronic right heart dysfunction/strain with or without tricuspid regurgitation. 5. Prominent central pulmonary veins but no evidence of pulmonary edema. 6. 2 nodules, one each in the right middle lobe and lateral basal left lower lobe, larger of the 2 is 9.5 mm. Consider a non-contrast Chest CT at 3 months, a PET/CT, or tissue sampling. These guidelines do not apply to immunocompromised patients and patients with cancer. Follow up in patients with significant comorbidities as clinically warranted. For lung cancer screening, adhere to Lung-RADS guidelines. Reference: Radiology. 2017; 284(1):228-43. 7. Bilateral thyroid nodules. Correlate with prior biopsy results regarding the possible need for follow-up or further  intervention. Last thyroid ultrasound at our facility was 04/02/2014. 8. Hepatic steatosis and left hepatic capsular nodularity consistent with a least early cirrhosis. No splenomegaly, ascites or portal vein dilatation. Electronically Signed   By: Telford Nab M.D.   On: 03/17/2022 05:42   DG CHEST PORT 1 VIEW  Result Date: 03/16/2022 CLINICAL DATA:  PICC line placement EXAM: PORTABLE CHEST 1 VIEW COMPARISON:  Previous studies including the examination done on 03/15/2022 FINDINGS: Transverse diameter of Elnoria Howard is increased. There are no signs of pulmonary edema or focal pulmonary consolidation. There is no significant pleural effusion or pneumothorax. There is  interval placement of PICC line through the right upper extremity with its tip in the region of superior vena cava. IMPRESSION: Cardiomegaly. There are no new infiltrates or signs of pulmonary edema. Tip of PICC line is seen in the course of superior vena cava. Electronically Signed   By: Elmer Picker M.D.   On: 03/16/2022 16:01   [place] EKG SITE RITE  Result Date: 03/16/2022 If Site Rite image not attached, placement could not be confirmed due to current cardiac rhythm.  DG Chest Portable 1 View  Result Date: 03/15/2022 CLINICAL DATA:  Central chest pain EXAM: PORTABLE CHEST 1 VIEW COMPARISON:  03/04/2021 FINDINGS: Stable enlarged cardiac silhouette. No effusion, infiltrate or pneumothorax. Atherosclerotic calcification of the aorta. No acute osseous abnormality. IMPRESSION: No acute cardiopulmonary process. Electronically Signed   By: Suzy Bouchard M.D.   On: 03/15/2022 13:05     Discharge Exam: Vitals:   03/17/22 0500 03/17/22 0743  BP:  (!) 161/64  Pulse: 70 70  Resp: 19 18  Temp:  97.8 F (36.6 C)  SpO2: 95% 92%   Vitals:   03/17/22 0300 03/17/22 0441 03/17/22 0500 03/17/22 0743  BP:  (!) 156/58  (!) 161/64  Pulse: 69 65 70 70  Resp: '19 17 19 18  '$ Temp:  97.8 F (36.6 C)  97.8 F (36.6 C)  TempSrc:  Oral  Oral   SpO2: 94% 94% 95% 92%  Weight:   76.6 kg   Height:        General: Pt is alert, awake, not in acute distress Cardiovascular: RRR, S1/S2 +, no rubs, no gallops, mild substernal chest tenderness Respiratory: CTA bilaterally, no wheezing, no rhonchi Abdominal: Soft, mild epigastric tenderness, ND, bowel sounds + Extremities: no edema, no cyanosis    The results of significant diagnostics from this hospitalization (including imaging, microbiology, ancillary and laboratory) are listed below for reference.     Microbiology: Recent Results (from the past 240 hour(s))  MRSA Next Gen by PCR, Nasal     Status: None   Collection Time: 03/15/22 10:36 PM   Specimen: Nasal Mucosa; Nasal Swab  Result Value Ref Range Status   MRSA by PCR Next Gen NOT DETECTED NOT DETECTED Final    Comment: (NOTE) The GeneXpert MRSA Assay (FDA approved for NASAL specimens only), is one component of a comprehensive MRSA colonization surveillance program. It is not intended to diagnose MRSA infection nor to guide or monitor treatment for MRSA infections. Test performance is not FDA approved in patients less than 74 years old. Performed at [facility], [place] [address], [place],  [address]      Labs: BNP (last 3 results) Recent Labs    12/01/21 1116 01/01/22 1420 03/15/22 1213  BNP 109.7* 77.3 478.2*   Basic Metabolic Panel: Recent Labs  Lab 03/15/22 1213 03/16/22 0658 03/17/22 0455  NA 136 137 135  K 3.5 4.0 4.3  CL 96* 101 96*  CO2 '27 25 28  '$ GLUCOSE 181* 110* 172*  BUN 56* 53* 48*  CREATININE 1.36* 1.38* 1.22*  CALCIUM 9.3 9.4 9.4  MG 2.1  --   --    Liver Function Tests: Recent Labs  Lab 03/15/22 1213  AST 20  ALT 11  ALKPHOS 68  BILITOT 0.6  PROT 7.2  ALBUMIN 3.7   Recent Labs  Lab 03/15/22 1213  LIPASE 51   No results for input(s): "AMMONIA" in the last 168 hours. CBC: Recent Labs  Lab 03/15/22 1213 03/16/22 0658 03/17/22 0455  WBC 10.0 8.3 6.9   NEUTROABS 8.0*  --  6.1  HGB 10.1* 9.7* 10.2*  HCT 31.7* 29.7* 31.8*  MCV 96.6 94.3 94.4  PLT 240 206 237   Cardiac Enzymes: No results for input(s): "CKTOTAL", "CKMB", "CKMBINDEX", "TROPONINI" in the last 168 hours. BNP: Invalid input(s): "POCBNP" CBG: No results for input(s): "GLUCAP" in the last 168 hours. D-Dimer No results for input(s): "DDIMER" in the last 72 hours. Hgb A1c No results for input(s): "HGBA1C" in the last 72 hours. Lipid Profile No results for input(s): "CHOL", "HDL", "LDLCALC", "TRIG", "CHOLHDL", "LDLDIRECT" in the last 72 hours. Thyroid function studies No results for input(s): "TSH", "T4TOTAL", "T3FREE", "THYROIDAB" in the last 72 hours.  Invalid input(s): "FREET3" Anemia work up No results for input(s): "VITAMINB12", "FOLATE", "FERRITIN", "TIBC", "IRON", "RETICCTPCT" in the last 72 hours. Urinalysis    Component Value Date/Time   COLORURINE YELLOW 07/16/2018 1229   APPEARANCEUR CLOUDY (A) 07/16/2018 1229   LABSPEC 1.013 07/16/2018 1229   PHURINE 6.0 07/16/2018 1229   GLUCOSEU NEGATIVE 07/16/2018 1229   HGBUR LARGE (A) 07/16/2018 1229   BILIRUBINUR NEGATIVE 07/16/2018 1229   KETONESUR NEGATIVE 07/16/2018 1229   PROTEINUR 30 (A) 07/16/2018 1229   UROBILINOGEN 0.2 11/04/2013 1321   NITRITE NEGATIVE 07/16/2018 1229   LEUKOCYTESUR LARGE (A) 07/16/2018 1229   Sepsis Labs Recent Labs  Lab 03/15/22 1213 03/16/22 0658 03/17/22 0455  WBC 10.0 8.3 6.9   Microbiology Recent Results (from the past 240 hour(s))  MRSA Next Gen by PCR, Nasal     Status: None   Collection Time: 03/15/22 10:36 PM   Specimen: Nasal Mucosa; Nasal Swab  Result Value Ref Range Status   MRSA by PCR Next Gen NOT DETECTED NOT DETECTED Final    Comment: (NOTE) The GeneXpert MRSA Assay (FDA approved for NASAL specimens only), is one component of a comprehensive MRSA colonization surveillance program. It is not intended to diagnose MRSA infection nor to guide or monitor  treatment for MRSA infections. Test performance is not FDA approved in patients less than 66 years old. Performed at [facility], [place] [address] [place], [place] [address]      Time coordinating discharge: Over 30 minutes  SIGNED:   Darliss Cheney, MD  [facility] 03/17/2022, 10:56 AM *Please note that this is a verbal dictation therefore any spelling or grammatical errors are due to the "[place] [employer]" system interpretation. If 7PM-7AM, please contact night-coverage [email or web address]

## 2022-03-17 NOTE — TOC Progression Note (Signed)
Transition of Care [facility]) - Progression Note    Patient Details  Name: [name] MRN: [id number] Date of Birth: 05/20/1935  Transition of Care [facility]) CM/SW [place], RN Phone Number:[phone]  03/17/2022, 8:43 AM  Clinical Narrative:    TOC acknowledges consult for patient with  trouble affording medications when in the doughnut hole. TOC will discuss options for meds with patient and continue to follow.         Expected Discharge Plan and Services                                                 Social Determinants of Health (SDOH) Interventions    Readmission Risk Interventions     No data to display

## 2022-03-17 NOTE — TOC Initial Note (Signed)
Transition of Care [facility]) - Initial/Assessment Note    Patient Details  Name: [name] MRN: [id number] Date of Birth: 1935/03/28  Transition of Care [facility]) CM/SW Contact:    Angelita Ingles, RN Phone Number:[phone]  03/17/2022, 10:12 AM  Clinical Narrative:                 CM at bedside to discuss medication assistance. Patient states that meds are expensive during donut hole. CM currently has suggested options such as PCP for samples or questionable option of more affordable meds. Patient has medicare coverage so resources from inpatient CM are very limited.   CM discussed recommendation for Home health services. CM provided patient with medicare.gov choice. Patient states that she has no preference as long as insurance will pay. Home health referral has been accepted by [facility] with Alvis Lemmings. AVS updated. No other needs noted at this time. TOC will continue to follow.        Patient Goals and CMS Choice        Expected Discharge Plan and Services                                                Prior Living Arrangements/Services                       Activities of Daily Living Home Assistive Devices/Equipment: Dentures (specify type), Eyeglasses, Blood pressure cuff, Grab bars in shower, Shower chair with back ADL Screening (condition at time of admission) Patient's cognitive ability adequate to safely complete daily activities?: Yes Is the patient deaf or have difficulty hearing?: No Does the patient have difficulty seeing, even when wearing glasses/contacts?: No Does the patient have difficulty concentrating, remembering, or making decisions?: No Patient able to express need for assistance with ADLs?: Yes Does the patient have difficulty dressing or bathing?: No Independently performs ADLs?: Yes (appropriate for developmental age) Does the patient have difficulty walking or climbing stairs?: Yes Weakness of Legs: None Weakness of Arms/Hands:  None  Permission Sought/Granted                  Emotional Assessment              Admission diagnosis:  NSTEMI (non-ST elevated myocardial infarction) [facility]) [I21.4] Chest pain [R07.9] Patient Active Problem List   Diagnosis Date Noted   Persistent atrial fibrillation ([place]) 03/15/2022   Chronic kidney disease, stage 3b ([place]) 03/15/2022   Elevated troponin 03/15/2022   Postmenopausal bleeding 06/05/2020   Chest pain 07/23/2019   Diverticulitis large intestine w/o perforation or abscess w/o bleeding 09/04/2018   Colovesical fistula    Preoperative clearance 08/07/2018   TIA (transient ischemic attack) 01/06/2018   Breast cancer ([place]) 09/13/2017   Obesity, unspecified 09/13/2017   Warfarin anticoagulation 09/13/2017   Uterine mass 09/07/2017   Adnexal mass 08/03/2017   Pelvic abscess in female 07/07/2017   Aortic stenosis    Dyslipidemia, goal LDL below 70 11/28/2016   Monitoring for long-term anticoagulant use 12/31/2015   Chronic diastolic CHF (congestive heart failure) ([place]) 10/28/2015   Insomnia 10/23/2015   Leg swelling 08/11/2015   Takotsubo syndrome 07/29/2015   DOE (dyspnea on exertion) 08/15/2014   Dilated aortic root ([place])    Pulmonary HTN ([place])    Permanent atrial fibrillation ([place]) 07/04/2013   Chronic anticoagulation 07/04/2013  Coronary artery disease    Lesion of nipple 05/15/2013   GERD (gastroesophageal reflux disease)    Hypertension    COPD (chronic obstructive pulmonary disease) ([place])    Osteopenia    Diverticulosis    Hiatal hernia    Abdominal pain 01/11/2012   Breast cancer of upper-outer quadrant of left female breast ([place]) 01/06/2012   Colon cancer ([place]) 01/06/2012   PCP:  [name], MD Pharmacy:   [place], [place] [address] Phone: [phone] Fax: [place] [address], [place]  [address] Phone: [phone] Fax: [phone]     Social Determinants of Health (SDOH) Interventions    Readmission Risk Interventions     No data to display

## 2022-03-17 NOTE — Evaluation (Signed)
Physical Therapy Evaluation Patient Details Name: [name] MRN: [id number] DOB: 05/14/1935 Today's Date: 03/17/2022  History of Present Illness  Pt is an 87 y/o female admitted secondary to chest pain. PMH includes HTN, dCHF, a fib, and CKD.  Clinical Impression  Pt admitted secondary to problem above with deficits below. Pt requiring min guard A for mobility tasks. Increased fatigue and reporting increased SOB, however, oxygen sats at 96% on RA. Recommending HHPT to address deficits at d/c. Will continue to follow acutely.        Recommendations for follow up therapy are one component of a multi-disciplinary discharge planning process, led by the attending physician.  Recommendations [place] be updated based on patient status, additional functional criteria and insurance authorization.  Follow Up Recommendations Home health PT      Assistance Recommended at Discharge Intermittent Supervision/Assistance  Patient can return home with the following  Assist for transportation;Assistance with cooking/housework    Equipment Recommendations None recommended by PT  Recommendations for Other Services       Functional Status Assessment Patient has had a recent decline in their functional status and demonstrates the ability to make significant improvements in function in a reasonable and predictable amount of time.     Precautions / Restrictions Precautions Precautions: Fall Restrictions Weight Bearing Restrictions: No      Mobility  Bed Mobility               General bed mobility comments: In chair upon entry    Transfers Overall transfer level: Needs assistance Equipment used: None Transfers: Sit to/from Stand Sit to Stand: Min guard           General transfer comment: Min guard for safety    Ambulation/Gait Ambulation/Gait assistance: Min guard Gait Distance (Feet): 120 Feet Assistive device: None Gait Pattern/deviations: Step-through pattern, Decreased stride  length, Decreased weight shift to left Gait velocity: Decreased     General Gait Details: Min guard for safety. Antalgic secondary to R knee pain. Educated about using rollator at home. Mild SOB noted, however, oxygen sats at 96% on RA.  Stairs            Wheelchair Mobility    Modified Rankin (Stroke Patients Only)       Balance Overall balance assessment: Needs assistance Sitting-balance support: No upper extremity supported, Feet supported Sitting balance-Leahy Scale: Good     Standing balance support: No upper extremity supported Standing balance-Leahy Scale: Fair                               Pertinent Vitals/Pain Pain Assessment Pain Assessment: No/denies pain    Home Living Family/patient expects to be discharged to:: Private residence Living Arrangements: Alone Available Help at Discharge: Family Type of Home: House Home Access: Level entry       Home Layout: One level Home Equipment: Other (comment);Shower seat;Rollator (4 wheels) (grab bars over toilet)      Prior Function Prior Level of Function : Independent/Modified Independent               ADLs Comments: Reports having to sponge bathe some days as she is nervous about getting into shower alone     Hand Dominance        Extremity/Trunk Assessment   Upper Extremity Assessment Upper Extremity Assessment: Defer to OT evaluation    Lower Extremity Assessment Lower Extremity Assessment: Generalized weakness;RLE deficits/detail RLE Deficits / Details: Reports  R knee pain at baseline    Cervical / Trunk Assessment Cervical / Trunk Assessment: Normal  Communication   Communication: No difficulties  Cognition Arousal/Alertness: Awake/alert Behavior During Therapy: WFL for tasks assessed/performed Overall Cognitive Status: Within Functional Limits for tasks assessed                                          General Comments      Exercises      Assessment/Plan    PT Assessment Patient needs continued PT services  PT Problem List Decreased strength;Decreased balance;Decreased activity tolerance;Decreased mobility;Decreased knowledge of use of DME       PT Treatment Interventions DME instruction;Gait training;Functional mobility training;Therapeutic activities;Therapeutic exercise;Balance training;Patient/family education    PT Goals (Current goals can be found in the Care Plan section)  Acute Rehab PT Goals Patient Stated Goal: to go home PT Goal Formulation: With patient Time For Goal Achievement: 03/31/22 Potential to Achieve Goals: Good    Frequency Min 3X/week     Co-evaluation               AM-PAC PT "6 Clicks" Mobility  Outcome Measure Help needed turning from your back to your side while in a flat bed without using bedrails?: A Little Help needed moving from lying on your back to sitting on the side of a flat bed without using bedrails?: A Little Help needed moving to and from a bed to a chair (including a wheelchair)?: A Little Help needed standing up from a chair using your arms (e.g., wheelchair or bedside chair)?: A Little Help needed to walk in hospital room?: A Little Help needed climbing 3-5 steps with a railing? : A Lot 6 Click Score: 17    End of Session Equipment Utilized During Treatment: Gait belt Activity Tolerance: Patient tolerated treatment well Patient left: in chair;with call bell/phone within reach Nurse Communication: Mobility status PT Visit Diagnosis: Unsteadiness on feet (R26.81);Muscle weakness (generalized) (M62.81)    Time: 6503-5465 PT Time Calculation (min) (ACUTE ONLY): 10 min   Charges:   PT Evaluation $PT Eval Low Complexity: 1 Low          Reuel Derby, PT, DPT  Acute Rehabilitation Services  Office: [phone]   Rudean Hitt 03/17/2022, 9:33 AM

## 2022-03-17 NOTE — Progress Notes (Signed)
Progress Note  Patient Name: [name] Date of Encounter: 03/17/2022  [facility] Cardiologist: Fransico Him, MD   Subjective   Had CT early this AM. No evidence of acute aortic syndrome.  She feels better this morning.  Inpatient Medications    Scheduled Meds:  amLODipine  5 mg Oral Daily   apixaban  5 mg Oral BID   Chlorhexidine Gluconate Cloth  6 each Topical Daily   cloNIDine  0.1 mg Oral BID   ferrous sulfate  325 mg Oral Q breakfast   losartan  100 mg Oral Daily   metoprolol succinate  25 mg Oral Daily   pantoprazole  40 mg Oral Daily   sodium chloride flush  10-40 mL Intracatheter Q12H   spironolactone  25 mg Oral Daily   torsemide  80 mg Oral BID   Continuous Infusions:  sodium chloride Stopped (03/17/22 0817)   PRN Meds: acetaminophen **OR** acetaminophen, clonazePAM, famotidine, ondansetron **OR** ondansetron (ZOFRAN) IV, mouth rinse, senna-docusate, sodium chloride flush   Vital Signs    Vitals:   03/17/22 0300 03/17/22 0441 03/17/22 0500 03/17/22 0743  BP:  (!) 156/58  (!) 161/64  Pulse: 69 65 70   Resp: '19 17 19 18  '$ Temp:  97.8 F (36.6 C)  97.8 F (36.6 C)  TempSrc:  Oral  Oral  SpO2: 94% 94% 95% 92%  Weight:   76.6 kg   Height:        Intake/Output Summary (Last 24 hours) at 03/17/2022 0856 Last data filed at 03/17/2022 0300 Gross per 24 hour  Intake 1050 ml  Output --  Net 1050 ml      03/17/2022    5:00 AM 03/16/2022    3:45 AM 03/15/2022   12:12 PM  Last 3 Weights  Weight (lbs) 168 lb 14 oz 167 lb 171 lb  Weight (kg) 76.6 kg 75.751 kg 77.565 kg      Telemetry    Atrial fibrillation with controlled rate- Personally Reviewed  ECG    A new tracing is not done today.  Ischemic changes were noted on the tracing from yesterday.- Personally Reviewed  Physical Exam  Sitting at bedside.  GEN: No acute distress.   Neck: No JVD Cardiac: IIRR.  No rubs heard on auscultation. Respiratory: Clear to auscultation bilaterally. GI:  Soft, nontender, non-distended  MS: No edema; No deformity. Neuro:  Nonfocal  Psych: Normal affect   Labs    High Sensitivity Troponin:   Recent Labs  Lab 03/15/22 1213 03/15/22 1438 03/15/22 1721 03/15/22 2044  TROPONINIHS 143* 165* 184* 182*     Chemistry Recent Labs  Lab 03/15/22 1213 03/16/22 0658 03/17/22 0455  NA 136 137 135  K 3.5 4.0 4.3  CL 96* 101 96*  CO2 '27 25 28  '$ GLUCOSE 181* 110* 172*  BUN 56* 53* 48*  CREATININE 1.36* 1.38* 1.22*  CALCIUM 9.3 9.4 9.4  MG 2.1  --   --   PROT 7.2  --   --   ALBUMIN 3.7  --   --   AST 20  --   --   ALT 11  --   --   ALKPHOS 68  --   --   BILITOT 0.6  --   --   GFRNONAA 38* 38* 43*  ANIONGAP '13 11 11    '$ Lipids No results for input(s): "CHOL", "TRIG", "HDL", "LABVLDL", "LDLCALC", "CHOLHDL" in the last 168 hours.  Hematology Recent Labs  Lab 03/15/22 1213 03/16/22  9485 03/17/22 0455  WBC 10.0 8.3 6.9  RBC 3.28* 3.15* 3.37*  HGB 10.1* 9.7* 10.2*  HCT 31.7* 29.7* 31.8*  MCV 96.6 94.3 94.4  MCH 30.8 30.8 30.3  MCHC 31.9 32.7 32.1  RDW 14.0 14.1 13.7  PLT 240 206 237   Thyroid No results for input(s): "TSH", "FREET4" in the last 168 hours.  BNP Recent Labs  Lab 03/15/22 1213  BNP 120.6*    DDimer No results for input(s): "DDIMER" in the last 168 hours.   Radiology    CT ANGIO CHEST AORTA W/CM & OR WO/CM  Result Date: 03/17/2022 CLINICAL DATA:  Acute aortic syndrome suspected. Re-evaluate for aneurysmal progression compared with 09/05/2018 EXAM: CT ANGIOGRAPHY CHEST WITH CONTRAST TECHNIQUE: Multidetector CT imaging of the chest was performed using the standard protocol during bolus administration of intravenous contrast. Multiplanar CT image reconstructions and MIPs were obtained to evaluate the vascular anatomy. RADIATION DOSE REDUCTION: This exam was performed according to the departmental dose-optimization program which includes automated exposure control, adjustment of the mA and/or kV according to patient  size and/or use of iterative reconstruction technique. CONTRAST:  66m OMNIPAQUE IOHEXOL 350 MG/ML SOLN COMPARISON:  CTA chest of 09/05/2018 FINDINGS: Cardiovascular: The pulmonary arteries are normal in caliber without appreciable thromboembolic filling defects. However, the pulmonary trunk is upper-normal caliber 3.2 cm. With both main pulmonary arteries approaching 3 cm. Also there is moderate cardiomegaly again noted with a right chamber predominance and RV/LV ratio again measuring 1.34 with IVC and hepatic vein reflux, indicating right heart dysfunction and/or tricuspid regurgitation. There are three-vessel coronary artery calcifications. There is no pericardial effusion. Near circumferential calcification in the mitral ring. The pulmonary veins are also prominent centrally more so superiorly. There is moderate aortic atherosclerosis. There are scattered plaques in the great vessels, for the most part are nonstenosing, but calcific plaque at the right subclavian artery origin does cause at least a 70% vessel origin stenosis. The ascending aorta measures ectatic at 4.0 cm, as remeasured by me today on the prior study was also 4.0 cm with no interval change. Rest of the thoracic aorta is within normal caliber limits with mild descending tortuosity. There is no dissection or aneurysm. No stenosis. Right PICC terminates in the distal SVC. Mediastinum/Nodes: Heterogeneous solid nodule of the posterior aspect of the left lobe of the thyroid gland measuring 3.0 cm, was previously 2.3 cm. There is a heterogeneous nodule in the right lower pole measuring 1.2 cm which was seen previously. Additional right lobe nodule in the upper pole is 1.6 cm, and is probably also slightly larger. Please correlate with prior biopsy results for possible significance or need for follow-up. No thyroid ultrasound is seen on this patient since 04/02/2014. There is no intrathoracic or axillary adenopathy. There is a small hiatal hernia. The  trachea is patent. There are chondral calcifications in the trachea and main bronchi. Lungs/Pleura: There is chronic pleural-parenchymal scarring in the lung apices. There is a calcified granuloma in the right upper lobe base. Paraspinal scarring in the medial right lower lobe is seen adjacent to thoracic spine osteophytes. There are scattered scar-like opacities in both bases. There is an irregular 9.5 mm nodule newly noted in the medial base of the right middle lobe merging with coarse scar-like opacities around it. There is also a new irregular noncalcified left lower lobe nodule in the lateral basal segment on 5:77 measuring 9.3 x 8 mm. Remainder of the lungs clear. There is no pleural effusion, thickening or pneumothorax.  Upper Abdomen: The liver is steatotic with capsular nodularity in the left lobe compatible with cirrhosis. There is no splenomegaly. There are numerous calcified splenic granulomas. The hepatic portal vein is not dilated. No acute abnormality. Scattered calcified liver granulomas. Musculoskeletal: Chronic moderate T12 compression fracture along the lower plate. There is osteopenia and degenerative change of the spine. No destructive bone lesions. The ribcage is intact. Review of the MIP images confirms the above findings. IMPRESSION: 1. 4.0 cm ectasia of the ascending aorta is stable since the prior study of 09/05/2018. 2. Aortic atherosclerosis with calcifications causing at least a 70% right subclavian artery origin stenosis. 3. Coronary artery calcifications with near circumferential calcification in the mitral ring. 4. Cardiomegaly with a right chamber predominance, IVC and hepatic vein reflux consistent with likely chronic right heart dysfunction/strain with or without tricuspid regurgitation. 5. Prominent central pulmonary veins but no evidence of pulmonary edema. 6. 2 nodules, one each in the right middle lobe and lateral basal left lower lobe, larger of the 2 is 9.5 mm. Consider a  non-contrast Chest CT at 3 months, a PET/CT, or tissue sampling. These guidelines do not apply to immunocompromised patients and patients with cancer. Follow up in patients with significant comorbidities as clinically warranted. For lung cancer screening, adhere to Lung-RADS guidelines. Reference: Radiology. 2017; 284(1):228-43. 7. Bilateral thyroid nodules. Correlate with prior biopsy results regarding the possible need for follow-up or further intervention. Last thyroid ultrasound at our facility was 04/02/2014. 8. Hepatic steatosis and left hepatic capsular nodularity consistent with a least early cirrhosis. No splenomegaly, ascites or portal vein dilatation. Electronically Signed   By: Telford Nab M.D.   On: 03/17/2022 05:42   DG CHEST PORT 1 VIEW  Result Date: 03/16/2022 CLINICAL DATA:  PICC line placement EXAM: PORTABLE CHEST 1 VIEW COMPARISON:  Previous studies including the examination done on 03/15/2022 FINDINGS: Transverse diameter of Elnoria Howard is increased. There are no signs of pulmonary edema or focal pulmonary consolidation. There is no significant pleural effusion or pneumothorax. There is interval placement of PICC line through the right upper extremity with its tip in the region of superior vena cava. IMPRESSION: Cardiomegaly. There are no new infiltrates or signs of pulmonary edema. Tip of PICC line is seen in the course of superior vena cava. Electronically Signed   By: Elmer Picker M.D.   On: 03/16/2022 16:01   [place] EKG SITE RITE  Result Date: 03/16/2022 If Site Rite image not attached, placement could not be confirmed due to current cardiac rhythm.  DG Chest Portable 1 View  Result Date: 03/15/2022 CLINICAL DATA:  Central chest pain EXAM: PORTABLE CHEST 1 VIEW COMPARISON:  03/04/2021 FINDINGS: Stable enlarged cardiac silhouette. No effusion, infiltrate or pneumothorax. Atherosclerotic calcification of the aorta. No acute osseous abnormality. IMPRESSION: No acute cardiopulmonary  process. Electronically Signed   By: Suzy Bouchard M.D.   On: 03/15/2022 13:05    Cardiac Studies   CT AORTA 03/17/2022: Aorta 4 cm diameter without evidence of acute aortic syndrome  ECHOCARDIOGRAM 03/17/2022: My not official review demonstrates preserved LV systolic function and persistent pulmonary hypertension that is at least moderate.  The aortic valve is sclerotic.  Left ventricular hypertrophy is present  Patient Profile     87 y.o. female with a hx of persistent atrial fibrillation Eliquis nonobstructive CAD (left and right heart cath 12/2020, nonobstructive CAD), prior Takotsubo cardiomyopathy 2016 with recovered LV function, chronic diastolic heart failure, hypertension, aortic stenosis, ascending aortic aneurysm (4.3 by CTA in 07/2015), pulmonary  hypertension, bradycardia (not on AV nodal blocking agent) and history of breast cancer who is being seen 03/15/2022 for the evaluation of elevated troponin at the request of Dr. Doren Custard.     Echo in 8/22 showed EF up to 65-70%, mild LVH, normal RV, PASP 67, moderate RAE, trivial MR.  PYP scan in 9/22 was probably negative.   Assessment & Plan    Chest pain: uncertain etiology. Coronary microvascular dysfunction (in setting of chronic diastolic HF) or inflammatory/musculoskeletal etiology are leading possibilities.. CT has ruled out acute aortic syndrome.  Elevated troponin I: Elevated flat troponins do portend a increased risk of cardiac events over next 6-12 months.  In absence of regional wall motion abnormality, no further ischemic evaluation is indicated.  Elevated troponin likely related to diastolic heart failure.. Pulmonary Hypertension: repeat echo will reassess. Likely secondary diastolic left heart failure. Acute on chronic diastolic HF: Unable to tolerate SGLT-2 due to recurrent UTI.Continue current therapy +/-ARNI. Needs f/u with Dr. Aundra Dubin in heart failure clinic.  [facility] will sign off.   Medication Recommendations:  See above Other recommendations (labs, testing, etc): None Follow up as an outpatient: Continue follow-up in outpatient heart failure clinic with Dr. Aundra Dubin in general cardiology clinic with Dr. Golden Hurter.    For questions or updates, please contact [place] Please consult [email or web address] for contact info under        Signed, Sinclair Grooms, MD  03/17/2022, 8:56 AM

## 2022-03-17 NOTE — Progress Notes (Signed)
Echocardiogram 2D Echocardiogram has been performed.  [name] M 03/17/2022, 10:46 AM

## 2022-03-17 NOTE — Progress Notes (Signed)
Discharge instructions given to patient at bedside. PICC line removed by IV team, site clean dry and intact.  Patient understands med changes and follow up appointments.

## 2022-03-18 ENCOUNTER — Telehealth: Payer: Self-pay

## 2022-03-18 ENCOUNTER — Ambulatory Visit: Payer: Self-pay | Admitting: *Deleted

## 2022-03-18 ENCOUNTER — Ambulatory Visit (INDEPENDENT_AMBULATORY_CARE_PROVIDER_SITE_OTHER): Payer: Medicare Other | Admitting: Family Medicine

## 2022-03-18 VITALS — BP 118/48 | HR 51 | Temp 97.7°F | Ht 63.0 in | Wt 176.0 lb

## 2022-03-18 DIAGNOSIS — R0789 Other chest pain: Secondary | ICD-10-CM

## 2022-03-18 DIAGNOSIS — I5032 Chronic diastolic (congestive) heart failure: Secondary | ICD-10-CM | POA: Diagnosis not present

## 2022-03-18 DIAGNOSIS — R0600 Dyspnea, unspecified: Secondary | ICD-10-CM | POA: Diagnosis not present

## 2022-03-18 MED ORDER — RANOLAZINE ER 500 MG PO TB12: 500.0000 mg | ORAL_TABLET | Freq: Two times a day (BID) | ORAL | 0 refills | Status: DC

## 2022-03-18 MED ORDER — BENZONATATE 200 MG PO CAPS: 200.0000 mg | ORAL_CAPSULE | Freq: Two times a day (BID) | ORAL | 0 refills | Status: DC | PRN

## 2022-03-18 NOTE — Telephone Encounter (Signed)
Reason for Disposition  [1] MODERATE difficulty breathing (e.g., speaks in phrases, SOB even at rest, pulse 100-120) AND [2] NEW-onset or WORSE than normal  Answer Assessment - Initial Assessment Questions 1. RESPIRATORY STATUS: "Describe your breathing?" (e.g., wheezing, shortness of breath, unable to speak, severe coughing     I went to hospital for chest pain.  Admitted.   Discharged yesterday. 2. ONSET: "When did this breathing problem begin?"      This morning when I got up I'm wheezing and short of breath.  I'm sore under my chest in the middle.   It's not as bad as it was last Friday.    It's tight in my chest.   I think I'm just sore.  I'm not going back to the hospital.   I owe a big bill now.   "I can't go back".   3. PATTERN "Does the difficult breathing come and go, or has it been constant since it started?"      Walking makes me short of breath more than when I'm sitting but I'm wheezing while sitting too. 4. SEVERITY: "How bad is your breathing?" (e.g., mild, moderate, severe)    - MILD: No SOB at rest, mild SOB with walking, speaks normally in sentences, can lie down, no retractions, pulse < 100.    - MODERATE: SOB at rest, SOB with minimal exertion and prefers to sit, cannot lie down flat, speaks in phrases, mild retractions, audible wheezing, pulse 100-120.    - SEVERE: Very SOB at rest, speaks in single words, struggling to breathe, sitting hunched forward, retractions, pulse > 120      Short of breath when sitting but it's worse when walking around.    I don't use an inhaler. 5. RECURRENT SYMPTOM: "Have you had difficulty breathing before?" If Yes, ask: "When was the last time?" and "What happened that time?"      I've had it before.   I've been coughing.   I'm spitting up a lot.    6. CARDIAC HISTORY: "Do you have any history of heart disease?" (e.g., heart attack, angina, bypass surgery, angioplasty)      I went to ED for chest pain.   Discharged yesterday.   I don't know if  they found anything or not.  They didn't tell me anything. 7. LUNG HISTORY: "Do you have any history of lung disease?"  (e.g., pulmonary embolus, asthma, emphysema)     I think COPD 8. CAUSE: "What do you think is causing the breathing problem?"      I think pneumonia     I've had it before. 9. OTHER SYMPTOMS: "Do you have any other symptoms? (e.g., dizziness, runny nose, cough, chest pain, fever)     My nose runs and my throat is sore.  I'm sore in my chest.  Not feel like I have fever. 10. O2 SATURATION MONITOR:  "Do you use an oxygen saturation monitor (pulse oximeter) at home?" If Yes, "What is your reading (oxygen level) today?" "What is your usual oxygen saturation reading?" (e.g., 95%)       No 11. PREGNANCY: "Is there any chance you are pregnant?" "When was your last menstrual period?"       N/A 12. TRAVEL: "Have you traveled out of the country in the last month?" (e.g., travel history, exposures)       N/A  Protocols used: Breathing Difficulty-A-AH

## 2022-03-18 NOTE — Progress Notes (Signed)
Subjective:    Patient ID: [name], female    DOB: 1935/03/17, 87 y.o.   MRN: [id number] Admit date: 03/15/2022 Discharge date: 03/17/2022       Admitted From: Home Disposition: Home  Subjective: Seen and examined.  She states that she feels better.  She has very minimal epigastric pain and has very minimal epigastric tenderness on exam as well.  No shortness of breath other than her usual.  She feels comfortable going home.   Brief/Interim Summary: [name] is a 87 y.o. female with medical history significant for persistent atrial fibrillation on Eliquis, nonobstructive CAD, history of Takotsubo cardiomyopathy with recovered EF, HFpEF (EF 65-70% by TTE 04/28/2021), CKD stage IIIb, pulmonary hypertension, COPD, hypertension who presented to the ED for evaluation of chest pain.  Patient reports new onset of substernal chest/epigastric pain beginning shortly after midnight 03/15/2022.  This occurred when she got up to use the bathroom.  Pain has been relatively constant in nature since then.  She reports intermittent sharp stabbing type sensation.  She has no change in discomfort on position.  Pain is worse with deep inspiration.  She reports chronic intermittent cough occasionally productive of white sputum.   Initial vitals showed BP 159/54, pulse 69, RR 18, temp 98.4 F, SPO2 95% on room air.   Labs show sodium 136, potassium 3.5, bicarb 27, BUN 56, creatinine 1.36, serum glucose 181, LFTs within normal limits, magnesium 2.1, lipase 51, BNP 120.6.  Troponin 143 > 165 > 184.   Portable chest x-ray negative for focal consolidation, edema, effusion.   Cardiology was consulted.  Patient was started on IV heparin.  Hospitalist service was consulted to admit for further evaluation and management.   Chest pain/epigastric pain/elevated troponin: Atypical in nature, also she is pointing to the epigastric area and she is tender to palpation on the epigastric and central chest area as well.   Troponin elevated but flat, she was started on heparin drip per cardiology recommendations.  Cardiology then saw her, they do not believe she has NSTEMI and recommended stopping heparin and they ordered CTA of the chest to rule out aortic dissection and fortunately, her aortic aneurysm was 4 cm, no different than how it was in 2019.  Patient's pain has improved.  She has point tenderness at the epigastrium as well as at the chest suggesting possible musculoskeletal as well as GI pain.  She was already taking famotidine and omeprazole.  I am discharging her on increasing dose of omeprazole to twice daily.  Cardiology had already cleared her yesterday and they signed off.  Patient is also agreeable with this discharge plan.  She was assessed by PT OT who recommended home health which we will arrange.   Chronic kidney disease, stage 3b ([place]) Renal function stable.  Continue to monitor.   Chronic diastolic CHF (congestive heart failure) (HCC) Euvolemic on admission.  EF 65-70% by TTE 04/28/2021.  Has associated pulmonary hypertension. -Continue home torsemide 80 mg twice daily -Monitor strict I/O's and daily weights -Echo is completed, results pending.  I will update discharge summary if there will be any change in the echo.   History of persistent atrial fibrillation: Will discontinue heparin now and resume back on Eliquis.  Continue Toprol-XL.   Hypertension Continue home amlodipine, clonidine, losartan, Toprol-XL, spironolactone, torsemide.   Discharge plan was discussed with patient and/or family member and they verbalized understanding and agreed with it.   03/18/22 Here today reporting chest pain. ECHO (completed prior  to discharge) 1. Left ventricular ejection fraction, by estimation, is 60-65%. Left  ventricular ejection fraction by 3D volume is 57 %. The left ventricle has  normal function. The left ventricle has no regional wall motion  abnormalities. There is mild concentric left    ventricular hypertrophy and moderate basal septal hypertrophy.   2. Right ventricular systolic function is normal. The right ventricular  size is moderately enlarged. There is moderately elevated pulmonary artery  systolic pressure. The estimated right ventricular systolic pressure is  15.4 mmHg.   3. The mitral valve is normal in structure. Trivial mitral valve  regurgitation. Mild mitral stenosis. The mean mitral valve gradient is 4.0  mmHg. Moderate mitral annular calcification.   4. The AVA by planimetry was 1.42cm2.. The aortic valve is tricuspid.  Aortic valve regurgitation is not visualized. Mild to moderate aortic  valve stenosis. Aortic valve area, by VTI measures 2.36 cm. Aortic valve  mean gradient measures 7.0 mmHg. Aortic   valve Vmax measures 1.76 m/s.   5. The inferior vena cava is normal in size with greater than 50%  respiratory variability, suggesting right atrial pressure of 3 mmHg.   6. Tricuspid valve regurgitation is mild to moderate.   7. Right atrial size was severely dilated.   On her cath in 2022: Mid LM lesion is 30% stenosed. Ost RCA lesion is 50% stenosed. Prox RCA lesion is 30% stenosed. RPAV lesion is 30% stenosed.   1. Normal RA pressure with elevated PCWP and prominent V-waves.  2. Mild pulmonary venous hypertension.  3. Nonobstructive coronary disease.   Elevated PCWP with prominent V-waves suggestive of significant mitral regurgitation versus significant LV diastolic dysfunction.  I reviewed prior echo, no significant MR.  I suspect this is representative of significant LV diastolic dysfunction.  I am concerned for possibility of cardiac amyloidosis, think patient needs workup for this.   03/18/22 Patient presents today with tightness in her chest.  She states that she has been coughing.  The cough is nonproductive.  She does not cough during our entire encounter today.  She denies any fever.  She states that it hurts to cough.  She points to an area  below her xiphoid process.  When I palpate this area she winces in pain.  She states that I can reproduce the pain by palpating in this area.  However she also reports coughing and wheezing.  Her lungs are clear to auscultation.  There is no wheezing today on examination.  She does have an elevated troponin while in the hospital that is stable.  Cardiology attributed this to microvascular ischemia due to diastolic dysfunction.  Patient states that when she took nitroglycerin the pain felt better.  However she cannot tolerate isosorbide due to itching and rash.  We discussed starting back on that and she declines.  She also reports feeling weak and dizzy.  I suspect that this is due to diuresis while in the hospital as her weight is down    Past Medical History:  Diagnosis Date   Allergy    rhinitis   Aortic stenosis    mild by echo 06/2017   Arthritis    Bradycardia    a. 10/2017 -> beta blocker cut back due to HR 39.   Breast cancer ([place]) 01/06/2012   Cancer ([place])    right colon and left breast   Chronic diastolic CHF (congestive heart failure) ([place])    Colon cancer ([place]) 01/06/2012   Colovesical fistula    Dr.  Marlou Starks and [name] planning surgery 08/2018- surgery revealed spontaneous closure   COPD (chronic obstructive pulmonary disease) ([place])    pt. denies   Coronary artery disease 2006   a.  NSTEMI in 2016, cath showed 15% prox-mid RCA, 20% prox LAD, EF 25-35% by cath and 35-40% -> felt due to Takotsubo cardiomyopathy.   Dilated aortic root ([place])    60mHg by echo 06/2017   Diverticulosis    Dyspnea    Edema extremities    GERD (gastroesophageal reflux disease)    Hernia    Hiatal hernia    denies   Hyperlipidemia    Hypertension    Mild aortic stenosis    echo 11/2015 but not noted on echo 06/2016   Osteopenia    Permanent atrial fibrillation (HCC)    chronic atrial fibrillation   Pneumonia    hx child   [place]ulmonary [place] ([place]    a. moderate to severe PASP 623mg echo 11/2015 -  now 4148m by echo 06/2017. CTA chest in 11/16 with no PE. PFTs in 7/15 with mild obstructive lung disease. She had a negative sleep study in 2017. b. Felt due to left sided HF.   [facility]  Takotsubo syndrome 07/29/2015   a. EF 35-40% by echo; akinesis of mid-apical anteroseptal and apical myocardium.  EF now normalized on echo 11/2015   Past Surgical History:  Procedure Laterality Date   APPENDECTOMY     BREAST SURGERY     lumpectomy left   CARDIAC CATHETERIZATION     CARDIAC CATHETERIZATION N/A 07/28/2015   Procedure: Left Heart Cath and Coronary Angiography;  Surgeon: Peter M JorMartiniqueD;  Location: [facility];  Service: Cardiovascular;  Laterality: N/A;   CHOLECYSTECTOMY     COLECTOMY     right side   CYSTOSCOPY WITH STENT PLACEMENT Left 09/04/2018   Procedure: CYSTOSCOPY WITH LEFT STENT PLACEMENT, BLADDER REPAIR, CYSTOSCOPY WITH LEFT STENT REMOVAL;  Surgeon: BorRaynelle BringD;  Location: [facility];  Service: Urology;  Laterality: Left;   EXCISION OF ACCESSORY NIPPLE Bilateral 05/30/2013   Procedure: BILATERAL NIPPLE BIOPSY;  Surgeon: PauMerrie RoofD;  [facility];  Laterality: Bilateral;   EYE SURGERY Bilateral 12   cataracts   HYSTEROSCOPY WITH D & C N/A 05/29/2020   Procedure: DILATATION AND CURETTAGE /HYSTEROSCOPY;  Surgeon: SchHomero FellersD;  Location: [facility] OR[facility];  Service: Gynecology;  Laterality: N/A;   IR RADIOLOGIST EVAL & MGMT  07/26/2017   LAPAROSCOPIC RIGHT COLECTOMY N/A 09/04/2018   Procedure: LAPAROSCOPIC ASSISTED SIGMOID COLECTOMY WITH REPAIR OF FISTULA TO BLADDER;  Surgeon: TotJovita KussmaulD;  Location: [facility];  Service: General;  Laterality: N/A;   RIGHT/LEFT HEART CATH AND CORONARY ANGIOGRAPHY N/A 12/10/2020   Procedure: RIGHT/LEFT HEART CATH AND CORONARY ANGIOGRAPHY;  Surgeon: McLLarey DresserD;  Location: [facility];  Service: Cardiovascular;  Laterality: N/A;   SPLIT NIGHT STUDY  02/02/2016       Current  Outpatient Medications on File Prior to Visit  Medication Sig Dispense Refill   acetaminophen (TYLENOL) 500 MG tablet Take 1,000 mg by mouth every 6 (six) hours as needed for headache or moderate pain.     amLODipine (NORVASC) 5 MG tablet Take 1 tablet (5 mg total) by mouth daily. 90 tablet 3   apixaban (ELIQUIS) 5 MG TABS tablet Take 1 tablet (5 mg total) by mouth 2 (two) times daily. 60 tablet 6   Azelastine  HCl 137 MCG/SPRAY SOLN Place 1 spray into both nostrils 2 (two) times daily as needed (congestion).     Cholecalciferol (VITAMIN D3) 25 MCG (1000 UT) CAPS Take 1,000 Units by mouth once a week.     clobetasol ointment (TEMOVATE) 2.11 % Apply 1 Application topically daily as needed (irritation).     clonazePAM (KLONOPIN) 0.5 MG tablet Take 1 tablet by mouth twice daily as needed for anxiety (Patient taking differently: Take 0.5 mg by mouth 2 (two) times daily as needed for anxiety.) 60 tablet 0   cloNIDine (CATAPRES) 0.1 MG tablet Take 1 tablet (0.1 mg total) by mouth 2 (two) times daily. 180 tablet 2   famotidine (PEPCID) 20 MG tablet Take 20 mg by mouth at bedtime as needed for heartburn.     ferrous sulfate 325 (65 FE) MG tablet Take 1 tablet (325 mg total) by mouth daily with breakfast. 90 tablet 3   fluticasone (FLONASE) 50 MCG/ACT nasal spray Place 1 spray into both nostrils daily as needed for allergies or rhinitis.     losartan (COZAAR) 100 MG tablet Take 1 tablet by mouth once daily (Patient taking differently: Take 100 mg by mouth daily.) 90 tablet 0   metoprolol succinate (TOPROL-XL) 25 MG 24 hr tablet Take 25 mg by mouth daily.     nitroGLYCERIN (NITROSTAT) 0.4 MG SL tablet DISSOLVE ONE TABLET UNDER THE TONGUE EVERY 5 MINUTES AS NEEDED FOR CHEST PAIN.  DO NOT EXCEED A TOTAL OF 3 DOSES IN 15 MINUTES (Patient taking differently: Place 0.4 mg under the tongue every 5 (five) minutes as needed for chest pain.) 25 tablet 8   pantoprazole (PROTONIX) 40 MG tablet Take 1 tablet (40 mg total)  by mouth 2 (two) times daily. 60 tablet 0   spironolactone (ALDACTONE) 25 MG tablet Take 1 tablet by mouth once daily (Patient taking differently: Take 25 mg by mouth daily.) 90 tablet 1   torsemide (DEMADEX) 20 MG tablet Take 4 tablets (80 mg total) by mouth 2 (two) times daily. 180 tablet 11   No current facility-administered medications on file prior to visit.   Allergies  Allergen Reactions   Contrast Media [Iodinated Contrast Media] Hives and Other (See Comments)    Per pt strong burning sensation starting in chest radiating outward    Clonidine Derivatives Other (See Comments)    Throat dry   Statins Other (See Comments)    "bones hurt"   Sulfa Antibiotics Diarrhea    Tremors    Celebrex [Celecoxib] Rash   Isosorbide Nitrate Itching and Rash   Other Itching and Rash    Plastic and paper tape and heart monitor pads   Tape Itching and Rash    Red Where applied and will spread   Social History   Socioeconomic History   Marital status: Married    Spouse name: Not on file   Number of children: Not on file   Years of education: Not on file   Highest education level: Not on file  Occupational History   Not on file  Tobacco Use   Smoking status: Never   Smokeless tobacco: Never  Vaping Use   Vaping Use: Never used  Substance and Sexual Activity   Alcohol use: No   Drug use: No   Sexual activity: Not Currently  Other Topics Concern   Not on file  Social History Narrative   Not on file   Social Determinants of Health   Financial Resource Strain: Not on file  Food Insecurity: Not on file  Transportation Needs: Not on file  Physical Activity: Not on file  Stress: Not on file  Social Connections: Not on file  Intimate Partner Violence: Not on file      Review of Systems  Musculoskeletal:  Positive for back pain.  All other systems reviewed and are negative.      Objective:   Physical Exam Vitals reviewed.  Constitutional:      General: She is not in  acute distress.    Appearance: Normal appearance. She is well-developed and normal weight. She is not diaphoretic.  HENT:     Right Ear: Tympanic membrane and ear canal normal.     Left Ear: Tympanic membrane and ear canal normal.     Nose: Nose normal. No congestion or rhinorrhea.     Mouth/Throat:     Mouth: Mucous membranes are moist.     Pharynx: No oropharyngeal exudate or posterior oropharyngeal erythema.  Eyes:     General:        Right eye: No discharge.        Left eye: No discharge.     Conjunctiva/sclera: Conjunctivae normal.  Neck:     Vascular: No carotid bruit.  Cardiovascular:     Rate and Rhythm: Normal rate and regular rhythm.     Heart sounds: Murmur heard.     No friction rub. No gallop.  Pulmonary:     Effort: Pulmonary effort is normal. No accessory muscle usage, prolonged expiration or respiratory distress.     Breath sounds: Normal breath sounds and air entry. No stridor, decreased air movement or transmitted upper airway sounds. No decreased breath sounds, wheezing, rhonchi or rales.  Chest:     Chest wall: No deformity or swelling.  Abdominal:     General: Abdomen is flat. Bowel sounds are normal. There is no distension.     Palpations: Abdomen is not rigid.     Tenderness: There is abdominal tenderness in the epigastric area. There is no guarding or rebound.     Hernia: No hernia is present.    Musculoskeletal:     Cervical back: No rigidity or tenderness.     Right lower leg: Edema present.     Left lower leg: Edema present.  Skin:    Findings: No bruising or lesion.  Neurological:     Mental Status: She is alert. Mental status is at baseline.     Motor: No abnormal muscle tone.     Coordination: Coordination normal.     Gait: Gait normal.     Deep Tendon Reflexes: Reflexes normal.  Psychiatric:        Mood and Affect: Mood normal.        Behavior: Behavior normal.        Thought Content: Thought content normal.        Judgment: Judgment  normal.          Assessment & Plan:  Chest wall pain  Chronic diastolic CHF (congestive heart failure) (HCC)  Dyspnea, unspecified type This is a very difficult case.  Patient refuses to go back to the emergency room.  I feel that most of her chest pain is likely musculoskeletal in nature especially given the fact that the pain is reproducible with palpation and made worse by coughing.  Her lungs are clear today and I do not see any signs or symptoms of pneumonia or wheezing.  I am going to place the patient on Ranexa 500 mg  twice daily to see if I can help with chronic chest pain in case there is an element of ischemia contributing to the pain.  Due to hypotension and when to stop the clonidine and recheck the patient in 1 week

## 2022-03-18 NOTE — Telephone Encounter (Signed)
RN Merrilyn Puma from [facility] triage called in about this pt refusing to go to the ER. Pt was triaged for shortness of breath, wheezing and chest tightness. RN stated that pt stated that she was just discharged from the hospital and is refusing to go back in fear of big bill. Please advise.  Cb#: [phone]

## 2022-03-18 NOTE — Telephone Encounter (Addendum)
  Chief Complaint: Shortness of breath, wheezing, tight/sore in middle of chest, coughing  (discharged from hospital yesterday, sent to ED for chest pain) Symptoms: wheezing, shortness of breath even at rest but worse with exertion.  Tightness/sore in middle of chest Frequency: When I got up this morning I'm wheezing and short of breath Pertinent Negatives: Patient denies fever Disposition: '[x]'$ ED /'[]'$ Urgent Care (no appt availability in office) / '[]'$ Appointment(In office/virtual)/ '[]'$  [facility]/ '[]'$ Home Care/ '[]'$ Refused Recommended Disposition /'[]'$ [facility]/ '[]'$  Follow-up with PCP Additional Notes: Protocol indicates going to the ED however pt is refusing ED due to "I have a big bill to pay".   "I'm not going back to the hospital".   I called into [facility] and made them aware that pt is refusing to go to the ED due to the expense.

## 2022-03-18 NOTE — Telephone Encounter (Signed)
Pt worked in today at [employer]

## 2022-03-20 DIAGNOSIS — M1990 Unspecified osteoarthritis, unspecified site: Secondary | ICD-10-CM | POA: Diagnosis not present

## 2022-03-20 DIAGNOSIS — I5032 Chronic diastolic (congestive) heart failure: Secondary | ICD-10-CM | POA: Diagnosis not present

## 2022-03-20 DIAGNOSIS — N1832 Chronic kidney disease, stage 3b: Secondary | ICD-10-CM | POA: Diagnosis not present

## 2022-03-20 DIAGNOSIS — I130 Hypertensive heart and chronic kidney disease with heart failure and stage 1 through stage 4 chronic kidney disease, or unspecified chronic kidney disease: Secondary | ICD-10-CM | POA: Diagnosis not present

## 2022-03-20 DIAGNOSIS — I4821 Permanent atrial fibrillation: Secondary | ICD-10-CM | POA: Diagnosis not present

## 2022-03-20 DIAGNOSIS — I252 Old myocardial infarction: Secondary | ICD-10-CM | POA: Diagnosis not present

## 2022-03-20 DIAGNOSIS — J449 Chronic obstructive pulmonary disease, unspecified: Secondary | ICD-10-CM | POA: Diagnosis not present

## 2022-03-20 DIAGNOSIS — I2510 Atherosclerotic heart disease of native coronary artery without angina pectoris: Secondary | ICD-10-CM | POA: Diagnosis not present

## 2022-03-20 DIAGNOSIS — K219 Gastro-esophageal reflux disease without esophagitis: Secondary | ICD-10-CM | POA: Diagnosis not present

## 2022-03-20 DIAGNOSIS — E785 Hyperlipidemia, unspecified: Secondary | ICD-10-CM | POA: Diagnosis not present

## 2022-03-20 DIAGNOSIS — M8580 Other specified disorders of bone density and structure, unspecified site: Secondary | ICD-10-CM | POA: Diagnosis not present

## 2022-03-20 DIAGNOSIS — E042 Nontoxic multinodular goiter: Secondary | ICD-10-CM | POA: Diagnosis not present

## 2022-03-20 DIAGNOSIS — K760 Fatty (change of) liver, not elsewhere classified: Secondary | ICD-10-CM | POA: Diagnosis not present

## 2022-03-20 DIAGNOSIS — I272 Pulmonary hypertension, unspecified: Secondary | ICD-10-CM | POA: Diagnosis not present

## 2022-03-20 DIAGNOSIS — I5181 Takotsubo syndrome: Secondary | ICD-10-CM | POA: Diagnosis not present

## 2022-03-20 DIAGNOSIS — K5790 Diverticulosis of intestine, part unspecified, without perforation or abscess without bleeding: Secondary | ICD-10-CM | POA: Diagnosis not present

## 2022-03-22 ENCOUNTER — Telehealth: Payer: Self-pay

## 2022-03-22 NOTE — Telephone Encounter (Signed)
Cecilie Lowers with [facility] called in requesting a order for this pt to continue with PT for 1x a week for 1 week and 2x's a week for 4 weeks to increase strength, balance, and walking distance.   Please call Cecilie Lowers at [facility] with any questions: [phone]

## 2022-03-22 NOTE — Telephone Encounter (Signed)
[name] with [facility] called in requesting a order for this pt to continue with PT for 1x a week for 1 week and 2x's a week for 4 weeks to increase strength, balance, and walking distance.    Please call [name] at [place] with any questions: [phone]  'Ok' on behalf of Dr. Dennard Schaumann per requested orders for the needed service pt is requiring.

## 2022-03-25 ENCOUNTER — Ambulatory Visit (INDEPENDENT_AMBULATORY_CARE_PROVIDER_SITE_OTHER): Payer: Medicare Other | Admitting: Family Medicine

## 2022-03-25 ENCOUNTER — Encounter: Payer: Self-pay | Admitting: Family Medicine

## 2022-03-25 ENCOUNTER — Other Ambulatory Visit: Payer: Self-pay | Admitting: Cardiology

## 2022-03-25 VITALS — BP 130/60 | HR 58 | Temp 97.8°F | Ht 63.0 in | Wt 172.8 lb

## 2022-03-25 DIAGNOSIS — R0789 Other chest pain: Secondary | ICD-10-CM | POA: Diagnosis not present

## 2022-03-25 DIAGNOSIS — R9389 Abnormal findings on diagnostic imaging of other specified body structures: Secondary | ICD-10-CM | POA: Insufficient documentation

## 2022-03-25 MED ORDER — LIDOCAINE 5 % EX PTCH: 1.0000 | MEDICATED_PATCH | CUTANEOUS | 0 refills | Status: DC

## 2022-03-25 NOTE — Progress Notes (Signed)
Subjective:    Patient ID: [name], female    DOB: 18-Apr-1935, 87 y.o.   MRN: [id number] Admit date: 03/15/2022 Discharge date: 03/17/2022       Admitted From: Home Disposition: Home  Subjective: Seen and examined.  She states that she feels better.  She has very minimal epigastric pain and has very minimal epigastric tenderness on exam as well.  No shortness of breath other than her usual.  She feels comfortable going home.   Brief/Interim Summary: [name] is a 87 y.o. female with medical history significant for persistent atrial fibrillation on Eliquis, nonobstructive CAD, history of Takotsubo cardiomyopathy with recovered EF, HFpEF (EF 65-70% by TTE 04/28/2021), CKD stage IIIb, pulmonary hypertension, COPD, hypertension who presented to the ED for evaluation of chest pain.  Patient reports new onset of substernal chest/epigastric pain beginning shortly after midnight 03/15/2022.  This occurred when she got up to use the bathroom.  Pain has been relatively constant in nature since then.  She reports intermittent sharp stabbing type sensation.  She has no change in discomfort on position.  Pain is worse with deep inspiration.  She reports chronic intermittent cough occasionally productive of white sputum.   Initial vitals showed BP 159/54, pulse 69, RR 18, temp 98.4 F, SPO2 95% on room air.   Labs show sodium 136, potassium 3.5, bicarb 27, BUN 56, creatinine 1.36, serum glucose 181, LFTs within normal limits, magnesium 2.1, lipase 51, BNP 120.6.  Troponin 143 > 165 > 184.   Portable chest x-ray negative for focal consolidation, edema, effusion.   Cardiology was consulted.  Patient was started on IV heparin.  Hospitalist service was consulted to admit for further evaluation and management.   Chest pain/epigastric pain/elevated troponin: Atypical in nature, also she is pointing to the epigastric area and she is tender to palpation on the epigastric and central chest area as well.   Troponin elevated but flat, she was started on heparin drip per cardiology recommendations.  Cardiology then saw her, they do not believe she has NSTEMI and recommended stopping heparin and they ordered CTA of the chest to rule out aortic dissection and fortunately, her aortic aneurysm was 4 cm, no different than how it was in 2019.  Patient's pain has improved.  She has point tenderness at the epigastrium as well as at the chest suggesting possible musculoskeletal as well as GI pain.  She was already taking famotidine and omeprazole.  I am discharging her on increasing dose of omeprazole to twice daily.  Cardiology had already cleared her yesterday and they signed off.  Patient is also agreeable with this discharge plan.  She was assessed by PT OT who recommended home health which we will arrange.   Chronic kidney disease, stage 3b ([place]) Renal function stable.  Continue to monitor.   Chronic diastolic CHF (congestive heart failure) (HCC) Euvolemic on admission.  EF 65-70% by TTE 04/28/2021.  Has associated pulmonary hypertension. -Continue home torsemide 80 mg twice daily -Monitor strict I/O's and daily weights -Echo is completed, results pending.  I will update discharge summary if there will be any change in the echo.   History of persistent atrial fibrillation: Will discontinue heparin now and resume back on Eliquis.  Continue Toprol-XL.   Hypertension Continue home amlodipine, clonidine, losartan, Toprol-XL, spironolactone, torsemide.   Discharge plan was discussed with patient and/or family member and they verbalized understanding and agreed with it.   03/18/22 Here today reporting chest pain. ECHO (completed prior  to discharge) 1. Left ventricular ejection fraction, by estimation, is 60-65%. Left  ventricular ejection fraction by 3D volume is 57 %. The left ventricle has  normal function. The left ventricle has no regional wall motion  abnormalities. There is mild concentric left    ventricular hypertrophy and moderate basal septal hypertrophy.   2. Right ventricular systolic function is normal. The right ventricular  size is moderately enlarged. There is moderately elevated pulmonary artery  systolic pressure. The estimated right ventricular systolic pressure is  56.4 mmHg.   3. The mitral valve is normal in structure. Trivial mitral valve  regurgitation. Mild mitral stenosis. The mean mitral valve gradient is 4.0  mmHg. Moderate mitral annular calcification.   4. The AVA by planimetry was 1.42cm2.. The aortic valve is tricuspid.  Aortic valve regurgitation is not visualized. Mild to moderate aortic  valve stenosis. Aortic valve area, by VTI measures 2.36 cm. Aortic valve  mean gradient measures 7.0 mmHg. Aortic   valve Vmax measures 1.76 m/s.   5. The inferior vena cava is normal in size with greater than 50%  respiratory variability, suggesting right atrial pressure of 3 mmHg.   6. Tricuspid valve regurgitation is mild to moderate.   7. Right atrial size was severely dilated.   On her cath in 2022: Mid LM lesion is 30% stenosed. Ost RCA lesion is 50% stenosed. Prox RCA lesion is 30% stenosed. RPAV lesion is 30% stenosed.   1. Normal RA pressure with elevated PCWP and prominent V-waves.  2. Mild pulmonary venous hypertension.  3. Nonobstructive coronary disease.   Elevated PCWP with prominent V-waves suggestive of significant mitral regurgitation versus significant LV diastolic dysfunction.  I reviewed prior echo, no significant MR.  I suspect this is representative of significant LV diastolic dysfunction.  I am concerned for possibility of cardiac amyloidosis, think patient needs workup for this.   03/18/22 Patient presents today with tightness in her chest.  She states that she has been coughing.  The cough is nonproductive.  She does not cough during our entire encounter today.  She denies any fever.  She states that it hurts to cough.  She points to an area  below her xiphoid process.  When I palpate this area she winces in pain.  She states that I can reproduce the pain by palpating in this area.  However she also reports coughing and wheezing.  Her lungs are clear to auscultation.  There is no wheezing today on examination.  She does have an elevated troponin while in the hospital that is stable.  Cardiology attributed this to microvascular ischemia due to diastolic dysfunction.  Patient states that when she took nitroglycerin the pain felt better.  However she cannot tolerate isosorbide due to itching and rash.  We discussed starting back on that and she declines.  She also reports feeling weak and dizzy.  I suspect that this is due to diuresis while in the hospital as her weight is down.  At that time, my plan was:  This is a very difficult case.  Patient refuses to go back to the emergency room.  I feel that most of her chest pain is likely musculoskeletal in nature especially given the fact that the pain is reproducible with palpation and made worse by coughing.  Her lungs are clear today and I do not see any signs or symptoms of pneumonia or wheezing.  I am going to place the patient on Ranexa 500 mg twice daily to see if  I can help with chronic chest pain in case there is an element of ischemia contributing to the pain.  Due to hypotension and when to stop the clonidine and recheck the patient in 1 week  03/25/22 Today her pain is completely different.  She now has sharp pain radiating from the xiphoid process around her ribs into her right posterior chest.  She jumps when I gently touched the rib in her mid axillary line.  She jumps with palpation all along the ribs on the right-hand side.  She denies any hemoptysis.  She denies any fevers or chills.  She denies any shortness of breath.  She denies any angina.  She is no longer complaining of pain in the center of her chest.  This seems to be almost neuropathic or a fractured rib and the way she is acting  today.  There is no sign of shingles or rash or erythema.    Past Medical History:  Diagnosis Date   Allergy    rhinitis   Aortic stenosis    mild by echo 06/2017   Arthritis    Bradycardia    a. 10/2017 -> beta blocker cut back due to HR 39.   Breast cancer ([place]) 01/06/2012   Cancer ([place])    right colon and left breast   Chronic diastolic CHF (congestive heart failure) ([place])    Colon cancer ([place]) 01/06/2012   Colovesical fistula    Dr. Marlou Starks and [name] planning surgery 08/2018- surgery revealed spontaneous closure   COPD (chronic obstructive pulmonary disease) ([place])    pt. denies   Coronary artery disease 2006   a.  NSTEMI in 2016, cath showed 15% prox-mid RCA, 20% prox LAD, EF 25-35% by cath and 35-40% -> felt due to Takotsubo cardiomyopathy.   Dilated aortic root ([place])    98mHg by echo 06/2017   Diverticulosis    Dyspnea    Edema extremities    GERD (gastroesophageal reflux disease)    Hernia    Hiatal hernia    denies   Hyperlipidemia    Hypertension    Mild aortic stenosis    echo 11/2015 but not noted on echo 06/2016   Osteopenia    Permanent atrial fibrillation (HCC)    chronic atrial fibrillation   Pneumonia    hx child   Pulmonary HTN ([facility]    a. moderate to severe PASP 64mg echo 11/2015 - now 4129m by echo 06/2017. CTA chest in 11/16 with no PE. PFTs in 7/15 with mild obstructive lung disease. She had a negative sleep study in 2017. b. Felt due to left sided HF.   [facility]  Takotsubo syndrome 07/29/2015   a. EF 35-40% by echo; akinesis of mid-apical anteroseptal and apical myocardium.  EF now normalized on echo 11/2015   Past Surgical History:  Procedure Laterality Date   APPENDECTOMY     BREAST SURGERY     lumpectomy left   CARDIAC CATHETERIZATION     CARDIAC CATHETERIZATION N/A 07/28/2015   Procedure: Left Heart Cath and Coronary Angiography;  Surgeon: Peter M JorMartiniqueD;  Location: [facility];  Service: Cardiovascular;  Laterality: N/A;    CHOLECYSTECTOMY     COLECTOMY     right side   CYSTOSCOPY WITH STENT PLACEMENT Left 09/04/2018   Procedure: CYSTOSCOPY WITH LEFT STENT PLACEMENT, BLADDER REPAIR, CYSTOSCOPY WITH LEFT STENT REMOVAL;  Surgeon: BorRaynelle BringD;  Location: [facility];  Service: Urology;  Laterality: Left;   EXCISION  OF ACCESSORY NIPPLE Bilateral 05/30/2013   Procedure: BILATERAL NIPPLE BIOPSY;  Surgeon: Merrie Roof, MD;  Location: [facility];  Service: General;  Laterality: Bilateral;   EYE SURGERY Bilateral 12   cataracts   HYSTEROSCOPY WITH D & C N/A 05/29/2020   Procedure: DILATATION AND CURETTAGE /HYSTEROSCOPY;  Surgeon: Homero Fellers, MD;  Location: [facility];  Service: Gynecology;  Laterality: N/A;   IR RADIOLOGIST EVAL & MGMT  07/26/2017   LAPAROSCOPIC RIGHT COLECTOMY N/A 09/04/2018   Procedure: LAPAROSCOPIC ASSISTED SIGMOID COLECTOMY WITH REPAIR OF FISTULA TO BLADDER;  Surgeon: Jovita Kussmaul, MD;  Location: [facility];  Service: General;  Laterality: N/A;   RIGHT/LEFT HEART CATH AND CORONARY ANGIOGRAPHY N/A 12/10/2020   Procedure: RIGHT/LEFT HEART CATH AND CORONARY ANGIOGRAPHY;  Surgeon: Larey Dresser, MD;  Location: [facility];  Service: Cardiovascular;  Laterality: N/A;   SPLIT NIGHT STUDY  02/02/2016       Current Outpatient Medications on File Prior to Visit  Medication Sig Dispense Refill   acetaminophen (TYLENOL) 500 MG tablet Take 1,000 mg by mouth every 6 (six) hours as needed for headache or moderate pain.     amLODipine (NORVASC) 5 MG tablet Take 1 tablet (5 mg total) by mouth daily. 90 tablet 3   apixaban (ELIQUIS) 5 MG TABS tablet Take 1 tablet (5 mg total) by mouth 2 (two) times daily. 60 tablet 6   Azelastine HCl 137 MCG/SPRAY SOLN Place 1 spray into both nostrils 2 (two) times daily as needed (congestion).     benzonatate (TESSALON) 200 MG capsule Take 1 capsule (200 mg total) by mouth 2 (two) times daily as needed for cough. 20 capsule 0   Cholecalciferol (VITAMIN D3) 25 MCG  (1000 UT) CAPS Take 1,000 Units by mouth once a week.     clobetasol ointment (TEMOVATE) 0.27 % Apply 1 Application topically daily as needed (irritation).     cloNIDine (CATAPRES) 0.1 MG tablet Take 1 tablet (0.1 mg total) by mouth 2 (two) times daily. 180 tablet 2   famotidine (PEPCID) 20 MG tablet Take 20 mg by mouth at bedtime as needed for heartburn.     ferrous sulfate 325 (65 FE) MG tablet Take 1 tablet (325 mg total) by mouth daily with breakfast. 90 tablet 3   fluticasone (FLONASE) 50 MCG/ACT nasal spray Place 1 spray into both nostrils daily as needed for allergies or rhinitis.     losartan (COZAAR) 100 MG tablet Take 1 tablet by mouth once daily (Patient taking differently: Take 100 mg by mouth daily.) 90 tablet 0   metoprolol succinate (TOPROL-XL) 25 MG 24 hr tablet Take 25 mg by mouth daily.     nitroGLYCERIN (NITROSTAT) 0.4 MG SL tablet DISSOLVE ONE TABLET UNDER THE TONGUE EVERY 5 MINUTES AS NEEDED FOR CHEST PAIN.  DO NOT EXCEED A TOTAL OF 3 DOSES IN 15 MINUTES (Patient taking differently: Place 0.4 mg under the tongue every 5 (five) minutes as needed for chest pain.) 25 tablet 8   pantoprazole (PROTONIX) 40 MG tablet Take 1 tablet (40 mg total) by mouth 2 (two) times daily. 60 tablet 0   ranolazine (RANEXA) 500 MG 12 hr tablet Take 1 tablet (500 mg total) by mouth 2 (two) times daily. 60 tablet 0   spironolactone (ALDACTONE) 25 MG tablet Take 1 tablet by mouth once daily (Patient taking differently: Take 25 mg by mouth daily.) 90 tablet 1   torsemide (DEMADEX) 20 MG tablet Take 4 tablets (80  mg total) by mouth 2 (two) times daily. 180 tablet 11   clonazePAM (KLONOPIN) 0.5 MG tablet Take 1 tablet by mouth twice daily as needed for anxiety (Patient not taking: Reported on 03/25/2022) 60 tablet 0   No current facility-administered medications on file prior to visit.   Allergies  Allergen Reactions   Contrast Media [Iodinated Contrast Media] Hives and Other (See Comments)    Per pt  strong burning sensation starting in chest radiating outward    Clonidine Derivatives Other (See Comments)    Throat dry   Statins Other (See Comments)    "bones hurt"   Sulfa Antibiotics Diarrhea    Tremors    Celebrex [Celecoxib] Rash   Isosorbide Nitrate Itching and Rash   Other Itching and Rash    Plastic and paper tape and heart monitor pads   Tape Itching and Rash    Red Where applied and will spread   Social History   Socioeconomic History   Marital status: Married    Spouse name: Not on file   Number of children: Not on file   Years of education: Not on file   Highest education level: Not on file  Occupational History   Not on file  Tobacco Use   Smoking status: Never   Smokeless tobacco: Never  Vaping Use   Vaping Use: Never used  Substance and Sexual Activity   Alcohol use: No   Drug use: No   Sexual activity: Not Currently  Other Topics Concern   Not on file  Social History Narrative   Not on file   Social Determinants of Health   Financial Resource Strain: Not on file  Food Insecurity: Not on file  Transportation Needs: Not on file  Physical Activity: Not on file  Stress: Not on file  Social Connections: Not on file  Intimate Partner Violence: Not on file      Review of Systems  Musculoskeletal:  Positive for back pain.  All other systems reviewed and are negative.      Objective:   Physical Exam Vitals reviewed.  Constitutional:      General: She is not in acute distress.    Appearance: Normal appearance. She is well-developed and normal weight. She is not diaphoretic.  HENT:     Left Ear: Ear canal normal.     Nose: No congestion or rhinorrhea.     Mouth/Throat:     Mouth: Mucous membranes are moist.     Pharynx: No oropharyngeal exudate or posterior oropharyngeal erythema.  Eyes:     General:        Right eye: No discharge.        Left eye: No discharge.     Conjunctiva/sclera: Conjunctivae normal.  Neck:     Vascular: No  carotid bruit.  Cardiovascular:     Rate and Rhythm: Normal rate and regular rhythm.     Heart sounds: Murmur heard.     No friction rub. No gallop.  Pulmonary:     Effort: Pulmonary effort is normal. No accessory muscle usage, prolonged expiration or respiratory distress.     Breath sounds: Normal breath sounds and air entry. No stridor, decreased air movement or transmitted upper airway sounds. No decreased breath sounds, wheezing, rhonchi or rales.    Chest:     Chest wall: Tenderness present. No mass, lacerations, deformity, swelling or crepitus.    Abdominal:     General: Abdomen is flat. Bowel sounds are normal. There is no  distension.     Palpations: Abdomen is not rigid.     Tenderness: There is abdominal tenderness in the epigastric area. There is no guarding or rebound.     Hernia: No hernia is present.    Musculoskeletal:     Cervical back: No rigidity or tenderness.     Right lower leg: Edema present.     Left lower leg: Edema present.  Skin:    Findings: No bruising or lesion.  Neurological:     Mental Status: She is alert. Mental status is at baseline.     Motor: No abnormal muscle tone.     Coordination: Coordination normal.     Gait: Gait normal.     Deep Tendon Reflexes: Reflexes normal.  Psychiatric:        Mood and Affect: Mood normal.        Behavior: Behavior normal.        Thought Content: Thought content normal.        Judgment: Judgment normal.          Assessment & Plan:  Chest wall pain Her presentation and exam is consistent with chest wall pain.  The differential diagnosis would be a fractured rib versus neuropathic pain/thoracic radiculopathy.  Recommended trying a lidocaine patch every day as needed and applying to the affected area.  I believe this Thayne help with the nervelike pain that she is having and with few complications.  I recommend stopping Ranexa because her presentation today does not demonstrate any evidence of angina

## 2022-03-26 DIAGNOSIS — I130 Hypertensive heart and chronic kidney disease with heart failure and stage 1 through stage 4 chronic kidney disease, or unspecified chronic kidney disease: Secondary | ICD-10-CM | POA: Diagnosis not present

## 2022-03-26 DIAGNOSIS — I2510 Atherosclerotic heart disease of native coronary artery without angina pectoris: Secondary | ICD-10-CM

## 2022-03-26 DIAGNOSIS — I272 Pulmonary hypertension, unspecified: Secondary | ICD-10-CM | POA: Diagnosis not present

## 2022-03-26 DIAGNOSIS — I5032 Chronic diastolic (congestive) heart failure: Secondary | ICD-10-CM | POA: Diagnosis not present

## 2022-03-26 DIAGNOSIS — I252 Old myocardial infarction: Secondary | ICD-10-CM

## 2022-03-26 DIAGNOSIS — I5181 Takotsubo syndrome: Secondary | ICD-10-CM

## 2022-03-26 DIAGNOSIS — N1832 Chronic kidney disease, stage 3b: Secondary | ICD-10-CM | POA: Diagnosis not present

## 2022-04-02 ENCOUNTER — Other Ambulatory Visit (HOSPITAL_COMMUNITY): Payer: Self-pay

## 2022-04-07 ENCOUNTER — Telehealth: Payer: Self-pay

## 2022-04-07 NOTE — Telephone Encounter (Signed)
PA for Lidocaine 5% fax to plan

## 2022-04-08 NOTE — Telephone Encounter (Signed)
PA for Lidocaine 5% has been denied, we'll appeal

## 2022-04-13 ENCOUNTER — Ambulatory Visit (HOSPITAL_COMMUNITY)
Admission: RE | Admit: 2022-04-13 | Discharge: 2022-04-13 | Disposition: A | Discharge status: Home / Self Care | Payer: Medicare Other | Source: Ambulatory Visit | Attending: Cardiology | Admitting: Cardiology

## 2022-04-13 ENCOUNTER — Encounter (HOSPITAL_COMMUNITY): Payer: Self-pay | Admitting: Cardiology

## 2022-04-13 ENCOUNTER — Telehealth: Payer: Self-pay

## 2022-04-13 VITALS — BP 140/60 | HR 63 | Wt 173.2 lb

## 2022-04-13 DIAGNOSIS — Z79899 Other long term (current) drug therapy: Secondary | ICD-10-CM | POA: Insufficient documentation

## 2022-04-13 DIAGNOSIS — I272 Pulmonary hypertension, unspecified: Secondary | ICD-10-CM | POA: Diagnosis not present

## 2022-04-13 DIAGNOSIS — I4821 Permanent atrial fibrillation: Secondary | ICD-10-CM | POA: Diagnosis not present

## 2022-04-13 DIAGNOSIS — I2510 Atherosclerotic heart disease of native coronary artery without angina pectoris: Secondary | ICD-10-CM | POA: Diagnosis not present

## 2022-04-13 DIAGNOSIS — Z87440 Personal history of urinary (tract) infections: Secondary | ICD-10-CM | POA: Insufficient documentation

## 2022-04-13 DIAGNOSIS — I5032 Chronic diastolic (congestive) heart failure: Secondary | ICD-10-CM | POA: Diagnosis not present

## 2022-04-13 DIAGNOSIS — I482 Chronic atrial fibrillation, unspecified: Secondary | ICD-10-CM | POA: Diagnosis not present

## 2022-04-13 DIAGNOSIS — Z7901 Long term (current) use of anticoagulants: Secondary | ICD-10-CM | POA: Insufficient documentation

## 2022-04-13 DIAGNOSIS — I110 Hypertensive heart disease with heart failure: Secondary | ICD-10-CM | POA: Diagnosis not present

## 2022-04-13 LAB — BASIC METABOLIC PANEL
Anion gap: 9 (ref 5–15)
BUN: 46 mg/dL — ABNORMAL HIGH (ref 8–23)
CO2: 29 mmol/L (ref 22–32)
Calcium: 9.4 mg/dL (ref 8.9–10.3)
Chloride: 98 mmol/L (ref 98–111)
Creatinine, Ser: 1.51 mg/dL — ABNORMAL HIGH (ref 0.44–1.00)
GFR, Estimated: 34 mL/min — ABNORMAL LOW (ref >60)
Glucose, Bld: 117 mg/dL — ABNORMAL HIGH (ref 70–99)
Potassium: 4.1 mmol/L (ref 3.5–5.1)
Sodium: 136 mmol/L (ref 135–145)

## 2022-04-13 LAB — BRAIN NATRIURETIC PEPTIDE: B Natriuretic Peptide: 86 pg/mL (ref 0.0–100.0)

## 2022-04-13 NOTE — Progress Notes (Signed)
ReDS Vest / Clip - 04/13/22 1500       ReDS Vest / Clip   Station Marker A    Ruler Value 27    ReDS Value Range Low volume    ReDS Actual Value 35

## 2022-04-13 NOTE — Patient Instructions (Addendum)
There has been no changes to your medications. ? ?Labs done today, your results will be available in MyChart, we will contact you for abnormal readings. ? ? ?Your physician recommends that you schedule a follow-up appointment in: 3 months.   ? ?If you have any questions or concerns before your next appointment please send us a message through mychart or call our office at [phone].   ? ?TO LEAVE A MESSAGE FOR THE NURSE SELECT OPTION 2, PLEASE LEAVE A MESSAGE INCLUDING: ?YOUR NAME ?DATE OF BIRTH ?CALL BACK NUMBER ?REASON FOR CALL**this is important as we prioritize the call backs ? ?YOU WILL RECEIVE A CALL BACK THE SAME DAY AS LONG AS YOU CALL BEFORE 4:00 PM ? ?At the [facility], you and your health needs are our priority. As part of our continuing mission to provide you with exceptional heart care, we have created designated Provider Care Teams. These Care Teams include your primary Cardiologist (physician) and Advanced Practice Providers (APPs- Physician Assistants and Nurse Practitioners) who all work together to provide you with the care you need, when you need it.  ? ?You [place] see any of the following providers on your designated Care Team at your next follow up: ?Dr Daniel Bensimhon ?Dr Dalton McLean ?Amy Clegg, NP ?Brittainy Simmons, PA ?Jessica Milford,NP ?Lindsay Finch, PA ?Lauren Kemp, PharmD ? ? ?Please be sure to bring in all your medications bottles to every appointment.  ? ? ?

## 2022-04-13 NOTE — Progress Notes (Signed)
Patient ID: Negin Hegg Mcinerney, female   DOB: 09-30-1934, 87 y.o.   MRN: [id number]    Advanced Heart Failure Clinic Note   PCP: [name], MD Cardiology: [name] HF Cardiology: Aundra Dubin  87 y.o. with history of poorly controlled HTN, chronic atrial fibrillation, chronic diastolic CHF, and prior episode of Takotsubo cardiomyopathy was referred by to HF clinic by Dr. [name]. Patient has had exertional dyspnea since 11/16.  At that time, she presented to the hospital with chest pain.  Coronary angiography showed no significant CAD.  EF was 35-40% by echo, suspected Takotsubo cardiomyopathy.  Her echo in 3/17 showed EF improved to 07-62% but PA systolic pressure elevated at 65 mmHg.  We transitioned her to torsemide.   RHC/LHC in 4/22 showed nonobstructive CAD; mean RA 5, PA 44/10, mean PCWP 20, CI 4.89, PVR < 1 WU.  Echo in 8/22 showed EF up to 65-70%, mild LVH, normal RV, PASP 67, moderate RAE, trivial MR. PYP scan in 9/22 was probably negative.   Patient was admitted in 7/23 with chest pain, she had a mild elevation in troponin with no trend.  This was thought to be due to demand ischemia with volume overload rather than ACS.  Echo this admission showed EF 60-65%, mild LVH, moderate RV enlargement, normal RV systolic function, PASP 50 mmHg, mild mitral stenosis with mean gradient 4 mmHg, mild-moderate AS, IVC normal.   Patient returns for followup of CHF.  She uses her cane for balance.  No further chest pain.  She is short of breath with inclines, stairs, and long walks.  No lightheadedness or falls.  She has chronic epigastric soreness, lidocaine patches help. Weight up 2 lbs.   REDS clip 35%  Labs (11/16): LDL 80 Labs (4/17): K 4.5, creatinine 1.1, BNP 132 Labs (5/17): LDL 92 Labs (3/18): K 4, creatinine 0.98, LDL 81, proBNP 834 Labs (6/18): K 4.1, creatinine 1.37, hgb 12.5 Labs (10/21): LDL 79, HDL 71 Labs (5/22): myeloma panel and urine immunofixation negative Labs (7/22): K 4.4, creatinine  1.19 Labs (2/23): K 4.5, creatinine 1.37, hgb 9.7 Labs (3/23): LDL 52 Labs (7/23): K 4.3, creatinine 1.22, hgb 10.2, BNP 121   PMH: 1. Hyperlipidemia: Myalgias with atorvastatin, Crestor, simvastatin. Myalgias with Zetia.  2. Chronic atrial fibrillation 3. HTN: Poor control.  4. H/o breast cancer: 2013.  5. GERD 6. Takotsubo cardiomyopathy: Admitted in 11/16 with chest pain.  Coronary angiography showed nonobstructive CAD.  Echo (11/16) showed EF 35-40%.  EF back to normal by 3/17 echo.  7. Chronic diastolic CHF - Echo (2/63) with EF 60-65%, mild aortic stenosis, PA systolic pressure 65 mmHg, RV mildly dilated with normal systolic function.  - Cardiolite (4/17) with EF 61%, small reversible mid anteroseptal/apical lateral defects thought to be related to shifting breast artifact => low risk study.  - Echo (12/21): EF 65-70%, normal RV, PASP 60 mmHg, mild AS, mild-moderate TR.   - RHC/LHC in 4/22 showed nonobstructive CAD; mean RA 5, PA 44/10, mean PCWP 20, CI 4.89, PVR < 1 WU.   - Echo in 8/22 showed EF up to 65-70%, mild LVH, normal RV, PASP 67, moderate RAE, trivial MR.  - PYP scan (9/22): H/CL < 1.5, grade 1.  Probably negative.   - Echo (7/23): EF 60-65%, mild LVH, moderate RV enlargement, normal RV systolic function, PASP 50 mmHg, mild mitral stenosis with mean gradient 4 mmHg, mild-moderate AS, IVC normal.  8. Ascending aortic aneurysm: 4.3 cm by CTA in 11/16.  9.  Pulmonary hypertension: PASP 65 mmHg by echo in 3/17.  CTA chest in 11/16 with no PE.  PFTs in 7/15 with mild obstructive lung disease.  10. Sleep study negative 2017. 11. Bradycardia: off nodal blockers.  - Holter (8/20): Average HR 51, afib 12. Aortic stenosis: Mild-moderate 7/23 echo. 13. Mitral stenosis: Mild 7/23 echo   Social History   Socioeconomic History   Marital status: Married    Spouse name: Not on file   Number of children: Not on file   Years of education: Not on file   Highest education level: Not on  file  Occupational History   Not on file  Tobacco Use   Smoking status: Never   Smokeless tobacco: Never  Vaping Use   Vaping Use: Never used  Substance and Sexual Activity   Alcohol use: No   Drug use: No   Sexual activity: Not Currently  Other Topics Concern   Not on file  Social History Narrative   Not on file   Social Determinants of Health   Financial Resource Strain: Not on file  Food Insecurity: Not on file  Transportation Needs: Not on file  Physical Activity: Not on file  Stress: Not on file  Social Connections: Not on file  Intimate Partner Violence: Not on file   Family History  Problem Relation Age of Onset   Heart disease Mother    Heart attack Mother    Cancer Sister        stomach and colon   Heart disease Brother 40   Hypertension Father    Cancer Sister    Stroke Neg Hx    ROS: All systems reviewed and negative except as per HPI.   Current Outpatient Medications  Medication Sig Dispense Refill   acetaminophen (TYLENOL) 500 MG tablet Take 1,000 mg by mouth every 6 (six) hours as needed for headache or moderate pain.     amLODipine (NORVASC) 5 MG tablet Take 1 tablet (5 mg total) by mouth daily. 90 tablet 3   apixaban (ELIQUIS) 5 MG TABS tablet Take 1 tablet (5 mg total) by mouth 2 (two) times daily. 60 tablet 6   Azelastine HCl 137 MCG/SPRAY SOLN Place 1 spray into both nostrils 2 (two) times daily as needed (congestion).     Cholecalciferol (VITAMIN D3) 25 MCG (1000 UT) CAPS Take 1,000 Units by mouth once a week.     clobetasol ointment (TEMOVATE) 3.55 % Apply 1 Application topically daily as needed (irritation).     clonazePAM (KLONOPIN) 0.5 MG tablet Take 1 tablet by mouth twice daily as needed for anxiety 60 tablet 0   cloNIDine (CATAPRES) 0.1 MG tablet Take 1 tablet (0.1 mg total) by mouth 2 (two) times daily. 180 tablet 2   famotidine (PEPCID) 20 MG tablet Take 20 mg by mouth at bedtime as needed for heartburn.     ferrous sulfate 325 (65 FE)  MG tablet Take 1 tablet (325 mg total) by mouth daily with breakfast. 90 tablet 3   fluticasone (FLONASE) 50 MCG/ACT nasal spray Place 1 spray into both nostrils daily as needed for allergies or rhinitis.     losartan (COZAAR) 100 MG tablet Take 1 tablet by mouth once daily 90 tablet 1   nitroGLYCERIN (NITROSTAT) 0.4 MG SL tablet DISSOLVE ONE TABLET UNDER THE TONGUE EVERY 5 MINUTES AS NEEDED FOR CHEST PAIN.  DO NOT EXCEED A TOTAL OF 3 DOSES IN 15 MINUTES 25 tablet 8   pantoprazole (PROTONIX) 40 MG tablet Take  1 tablet (40 mg total) by mouth 2 (two) times daily. 60 tablet 0   ranolazine (RANEXA) 500 MG 12 hr tablet Take 500 mg by mouth as needed.     spironolactone (ALDACTONE) 25 MG tablet Take 1 tablet by mouth once daily 90 tablet 1   torsemide (DEMADEX) 20 MG tablet Take 4 tablets (80 mg total) by mouth 2 (two) times daily. 180 tablet 11   No current facility-administered medications for this encounter.   BP (!) 140/60   Pulse 63   Wt 78.6 kg (173 lb 3.2 oz)   SpO2 94%   BMI 30.68 kg/m    Wt Readings from Last 3 Encounters:  04/13/22 78.6 kg (173 lb 3.2 oz)  03/25/22 78.4 kg (172 lb 12.8 oz)  03/18/22 79.8 kg (176 lb)    General: NAD Neck: JVP 8 cm with HJR, no thyromegaly or thyroid nodule.  Lungs: Clear to auscultation bilaterally with normal respiratory effort. CV: Nondisplaced PMI.  Heart irregular S1/S2, no S3/S4, 2/6 SEM RUSB.  Trace ankle edema.  No carotid bruit.  Normal pedal pulses.  Abdomen: Soft, nontender, no hepatosplenomegaly, no distention.  Skin: Intact without lesions or rashes.  Neurologic: Alert and oriented x 3.  Psych: Normal affect. Extremities: No clubbing or cyanosis.  HEENT: Normal.   Assessment/Plan: 1. Chronic atrial fibrillation: She is off nodal blockade with HR in 60s today.  - Continue Eliquis 5 mg bid.    2. HTN: BP reasonably controlled.  Continue current regimen.  3. Chronic diastolic CHF: NYHA class III symptoms, slowly progressive over  time.  Mild volume overload on exam.  RHC/LHC in 4/22 showed nonobstructive CAD; mean RA 5, PA 44/10, mean PCWP 20, CI 4.89, PVR < 1 WU (pulmonary venous hypertension).  Echo in 8/22 showed EF up to 65-70%, mild LVH, normal RV, PASP 67, moderate RAE, trivial MR. PYP scan in 9/22 was probably negative. Echo in 7/23 showed EF 60-65%, mild LVH, moderate RV enlargement, normal RV systolic function, PASP 50 mmHg, mild mitral stenosis with mean gradient 4 mmHg, mild-moderate AS, IVC normal. She is not significantly volume overloaded today by exam and REDS clip is borderline at 35%.  NYHA class III symptoms chronically.  - Continue torsemide 80 mg bid. BMET/BNP today.   - Unable to tolerate Jardiance due to frequent UTIs.  - I think she would be a good candidate for Cardiomems for volume monitoring.  We discussed and she seems interested.  Will see if we can get insurance coverage.  4. CAD: Nonobstructive on 4/22 cath. She has been intolerant of statins and Zetia, LDL 52 in 3/23.   She has had atypical chest pain with recent negative workup.  - No ASA given apixaban use.   Followup in 3 months with APP.   Loralie Champagne 04/13/2022

## 2022-04-13 NOTE — Telephone Encounter (Signed)
PA sent to plan 04/13/22 

## 2022-04-14 ENCOUNTER — Telehealth: Payer: Self-pay

## 2022-04-14 NOTE — Telephone Encounter (Signed)
PA-lidocaine 5% sent to plan

## 2022-04-16 ENCOUNTER — Ambulatory Visit (INDEPENDENT_AMBULATORY_CARE_PROVIDER_SITE_OTHER): Payer: Medicare Other | Admitting: Family Medicine

## 2022-04-16 VITALS — BP 130/48 | HR 58 | Temp 98.1°F | Ht 63.0 in | Wt 176.0 lb

## 2022-04-16 DIAGNOSIS — I5032 Chronic diastolic (congestive) heart failure: Secondary | ICD-10-CM | POA: Diagnosis not present

## 2022-04-16 NOTE — Progress Notes (Signed)
Subjective:    Patient ID: [name], female    DOB: 11/11/1934, 87 y.o.   MRN: [id number] Admit date: 03/15/2022 Discharge date: 03/17/2022       Admitted From: Home Disposition: Home  Subjective: Seen and examined.  She states that she feels better.  She has very minimal epigastric pain and has very minimal epigastric tenderness on exam as well.  No shortness of breath other than her usual.  She feels comfortable going home.   Brief/Interim Summary: [name] is a 87 y.o. female with medical history significant for persistent atrial fibrillation on Eliquis, nonobstructive CAD, history of Takotsubo cardiomyopathy with recovered EF, HFpEF (EF 65-70% by TTE 04/28/2021), CKD stage IIIb, pulmonary hypertension, COPD, hypertension who presented to the ED for evaluation of chest pain.  Patient reports new onset of substernal chest/epigastric pain beginning shortly after midnight 03/15/2022.  This occurred when she got up to use the bathroom.  Pain has been relatively constant in nature since then.  She reports intermittent sharp stabbing type sensation.  She has no change in discomfort on position.  Pain is worse with deep inspiration.  She reports chronic intermittent cough occasionally productive of white sputum.   Initial vitals showed BP 159/54, pulse 69, RR 18, temp 98.4 F, SPO2 95% on room air.   Labs show sodium 136, potassium 3.5, bicarb 27, BUN 56, creatinine 1.36, serum glucose 181, LFTs within normal limits, magnesium 2.1, lipase 51, BNP 120.6.  Troponin 143 > 165 > 184.   Portable chest x-ray negative for focal consolidation, edema, effusion.   Cardiology was consulted.  Patient was started on IV heparin.  Hospitalist service was consulted to admit for further evaluation and management.   Chest pain/epigastric pain/elevated troponin: Atypical in nature, also she is pointing to the epigastric area and she is tender to palpation on the epigastric and central chest area as well.   Troponin elevated but flat, she was started on heparin drip per cardiology recommendations.  Cardiology then saw her, they do not believe she has NSTEMI and recommended stopping heparin and they ordered CTA of the chest to rule out aortic dissection and fortunately, her aortic aneurysm was 4 cm, no different than how it was in 2019.  Patient's pain has improved.  She has point tenderness at the epigastrium as well as at the chest suggesting possible musculoskeletal as well as GI pain.  She was already taking famotidine and omeprazole.  I am discharging her on increasing dose of omeprazole to twice daily.  Cardiology had already cleared her yesterday and they signed off.  Patient is also agreeable with this discharge plan.  She was assessed by PT OT who recommended home health which we will arrange.   Chronic kidney disease, stage 3b ([place]) Renal function stable.  Continue to monitor.   Chronic diastolic CHF (congestive heart failure) (HCC) Euvolemic on admission.  EF 65-70% by TTE 04/28/2021.  Has associated pulmonary hypertension. -Continue home torsemide 80 mg twice daily -Monitor strict I/O's and daily weights -Echo is completed, results pending.  I will update discharge summary if there will be any change in the echo.   History of persistent atrial fibrillation: Will discontinue heparin now and resume back on Eliquis.  Continue Toprol-XL.   Hypertension Continue home amlodipine, clonidine, losartan, Toprol-XL, spironolactone, torsemide.   Discharge plan was discussed with patient and/or family member and they verbalized understanding and agreed with it.   03/18/22 Here today reporting chest pain. ECHO (completed prior  to discharge) 1. Left ventricular ejection fraction, by estimation, is 60-65%. Left  ventricular ejection fraction by 3D volume is 57 %. The left ventricle has  normal function. The left ventricle has no regional wall motion  abnormalities. There is mild concentric left    ventricular hypertrophy and moderate basal septal hypertrophy.   2. Right ventricular systolic function is normal. The right ventricular  size is moderately enlarged. There is moderately elevated pulmonary artery  systolic pressure. The estimated right ventricular systolic pressure is  81.1 mmHg.   3. The mitral valve is normal in structure. Trivial mitral valve  regurgitation. Mild mitral stenosis. The mean mitral valve gradient is 4.0  mmHg. Moderate mitral annular calcification.   4. The AVA by planimetry was 1.42cm2.. The aortic valve is tricuspid.  Aortic valve regurgitation is not visualized. Mild to moderate aortic  valve stenosis. Aortic valve area, by VTI measures 2.36 cm. Aortic valve  mean gradient measures 7.0 mmHg. Aortic   valve Vmax measures 1.76 m/s.   5. The inferior vena cava is normal in size with greater than 50%  respiratory variability, suggesting right atrial pressure of 3 mmHg.   6. Tricuspid valve regurgitation is mild to moderate.   7. Right atrial size was severely dilated.   On her cath in 2022: Mid LM lesion is 30% stenosed. Ost RCA lesion is 50% stenosed. Prox RCA lesion is 30% stenosed. RPAV lesion is 30% stenosed.   1. Normal RA pressure with elevated PCWP and prominent V-waves.  2. Mild pulmonary venous hypertension.  3. Nonobstructive coronary disease.   Elevated PCWP with prominent V-waves suggestive of significant mitral regurgitation versus significant LV diastolic dysfunction.  I reviewed prior echo, no significant MR.  I suspect this is representative of significant LV diastolic dysfunction.  I am concerned for possibility of cardiac amyloidosis, think patient needs workup for this.   03/18/22 Patient presents today with tightness in her chest.  She states that she has been coughing.  The cough is nonproductive.  She does not cough during our entire encounter today.  She denies any fever.  She states that it hurts to cough.  She points to an area  below her xiphoid process.  When I palpate this area she winces in pain.  She states that I can reproduce the pain by palpating in this area.  However she also reports coughing and wheezing.  Her lungs are clear to auscultation.  There is no wheezing today on examination.  She does have an elevated troponin while in the hospital that is stable.  Cardiology attributed this to microvascular ischemia due to diastolic dysfunction.  Patient states that when she took nitroglycerin the pain felt better.  However she cannot tolerate isosorbide due to itching and rash.  We discussed starting back on that and she declines.  She also reports feeling weak and dizzy.  I suspect that this is due to diuresis while in the hospital as her weight is down.  At that time, my plan was:  This is a very difficult case.  Patient refuses to go back to the emergency room.  I feel that most of her chest pain is likely musculoskeletal in nature especially given the fact that the pain is reproducible with palpation and made worse by coughing.  Her lungs are clear today and I do not see any signs or symptoms of pneumonia or wheezing.  I am going to place the patient on Ranexa 500 mg twice daily to see if  I can help with chronic chest pain in case there is an element of ischemia contributing to the pain.  Due to hypotension and when to stop the clonidine and recheck the patient in 1 week  03/25/22 Today her pain is completely different.  She now has sharp pain radiating from the xiphoid process around her ribs into her right posterior chest.  She jumps when I gently touched the rib in her mid axillary line.  She jumps with palpation all along the ribs on the right-hand side.  She denies any hemoptysis.  She denies any fevers or chills.  She denies any shortness of breath.  She denies any angina.  She is no longer complaining of pain in the center of her chest.  This seems to be almost neuropathic or a fractured rib and the way she is acting  today.  There is no sign of shingles or rash or erythema.  At that time, my plan was:  Her presentation and exam is consistent with chest wall pain.  The differential diagnosis would be a fractured rib versus neuropathic pain/thoracic radiculopathy.  Recommended trying a lidocaine patch every day as needed and applying to the affected area.  I believe this Hannay help with the nervelike pain that she is having and with few complications.  I recommend stopping Ranexa because her presentation today does not demonstrate any evidence of angina  04/16/22  Wt Readings from Last 3 Encounters:  04/16/22 176 lb (79.8 kg)  04/13/22 173 lb 3.2 oz (78.6 kg)  03/25/22 172 lb 12.8 oz (78.4 kg)   Patient is not sure why she is here.  Her home health nurse and made the appointment.  She reports chest congestion however this is a chronic problem for her.  She does have faint bibasilar crackles but this is unchanged.  She also has trace pitting edema but she does not appear fluid overloaded.  She just saw her cardiologist 3 days ago and they recommended making no changes in her diuretic.  They did discuss possibly giving her metolazone once weekly if necessary but at the present time she does not appear fluid overloaded.  She is taking Mucinex and she states this does not help with chest congestion   Past Medical History:  Diagnosis Date   Allergy    rhinitis   Aortic stenosis    mild by echo 06/2017   Arthritis    Bradycardia    a. 10/2017 -> beta blocker cut back due to HR 39.   Breast cancer ([place]) 01/06/2012   Cancer ([place])    right colon and left breast   Chronic diastolic CHF (congestive heart failure) ([place])    Colon cancer ([place]) 01/06/2012   Colovesical fistula    Dr. Marlou Starks and [name] planning surgery 08/2018- surgery revealed spontaneous closure   COPD (chronic obstructive pulmonary disease) ([place])    pt. denies   Coronary artery disease 2006   a.  NSTEMI in 2016, cath showed 15% prox-mid RCA, 20% prox  LAD, EF 25-35% by cath and 35-40% -> felt due to Takotsubo cardiomyopathy.   Dilated aortic root ([place])    19mHg by echo 06/2017   Diverticulosis    Dyspnea    Edema extremities    GERD (gastroesophageal reflux disease)    Hernia    Hiatal hernia    denies   Hyperlipidemia    Hypertension    Mild aortic stenosis    echo 11/2015 but not noted on echo 06/2016  Osteopenia    Permanent atrial fibrillation (HCC)    chronic atrial fibrillation   Pneumonia    hx child   Pulmonary HTN ([place])    a. moderate to severe PASP 37mHg echo 11/2015 - now 432mg by echo 06/2017. CTA chest in 11/16 with no PE. PFTs in 7/15 with mild obstructive lung disease. She had a negative sleep study in 2017. b. Felt due to left sided HF.   [facility]   Takotsubo syndrome 07/29/2015   a. EF 35-40% by echo; akinesis of mid-apical anteroseptal and apical myocardium.  EF now normalized on echo 11/2015   Past Surgical History:  Procedure Laterality Date   APPENDECTOMY     BREAST SURGERY     lumpectomy left   CARDIAC CATHETERIZATION     CARDIAC CATHETERIZATION N/A 07/28/2015   Procedure: Left Heart Cath and Coronary Angiography;  Surgeon: Peter M JoMartiniqueMD;  [facility];  Service: Cardiovascular;  Laterality: N/A;   CHOLECYSTECTOMY     COLECTOMY     right side   CYSTOSCOPY WITH STENT PLACEMENT Left 09/04/2018   Procedure: CYSTOSCOPY WITH LEFT STENT PLACEMENT, BLADDER REPAIR, CYSTOSCOPY WITH LEFT STENT REMOVAL;  Surgeon: BoRaynelle BringMD;  Location: [facility];  Service: Urology;  Laterality: Left;   EXCISION OF ACCESSORY NIPPLE Bilateral 05/30/2013   Procedure: BILATERAL NIPPLE BIOPSY;  Surgeon: PaMerrie RoofMD;  [facility] Service: General;  Laterality: Bilateral;   EYE SURGERY Bilateral 12   cataracts   HYSTEROSCOPY WITH D & C N/A 05/29/2020   Procedure: DILATATION AND CURETTAGE /HYSTEROSCOPY;  Surgeon: ScHomero FellersMD;  Location: [facility];  Service: Gynecology;   Laterality: N/A;   IR RADIOLOGIST EVAL & MGMT  07/26/2017   LAPAROSCOPIC RIGHT COLECTOMY N/A 09/04/2018   Procedure: LAPAROSCOPIC ASSISTED SIGMOID COLECTOMY WITH REPAIR OF FISTULA TO BLADDER;  Surgeon: ToJovita KussmaulMD;  Location: [facility];  Service: General;  Laterality: N/A;   RIGHT/LEFT HEART CATH AND CORONARY ANGIOGRAPHY N/A 12/10/2020   Procedure: RIGHT/LEFT HEART CATH AND CORONARY ANGIOGRAPHY;  Surgeon: McLarey DresserMD;  [facility];  Service: Cardiovascular;  Laterality: N/A;   SPLIT NIGHT STUDY  02/02/2016       Current Outpatient Medications on File Prior to Visit  Medication Sig Dispense Refill   acetaminophen (TYLENOL) 500 MG tablet Take 1,000 mg by mouth every 6 (six) hours as needed for headache or moderate pain.     amLODipine (NORVASC) 5 MG tablet Take 1 tablet (5 mg total) by mouth daily. 90 tablet 3   apixaban (ELIQUIS) 5 MG TABS tablet Take 1 tablet (5 mg total) by mouth 2 (two) times daily. 60 tablet 6   Azelastine HCl 137 MCG/SPRAY SOLN Place 1 spray into both nostrils 2 (two) times daily as needed (congestion).     Cholecalciferol (VITAMIN D3) 25 MCG (1000 UT) CAPS Take 1,000 Units by mouth once a week.     clobetasol ointment (TEMOVATE) 0.1.61 Apply 1 Application topically daily as needed (irritation).     clonazePAM (KLONOPIN) 0.5 MG tablet Take 1 tablet by mouth twice daily as needed for anxiety 60 tablet 0   cloNIDine (CATAPRES) 0.1 MG tablet Take 1 tablet (0.1 mg total) by mouth 2 (two) times daily. 180 tablet 2   famotidine (PEPCID) 20 MG tablet Take 20 mg by mouth at bedtime as needed for heartburn.     ferrous sulfate 325 (65 FE) MG tablet Take  1 tablet (325 mg total) by mouth daily with breakfast. 90 tablet 3   fluticasone (FLONASE) 50 MCG/ACT nasal spray Place 1 spray into both nostrils daily as needed for allergies or rhinitis.     losartan (COZAAR) 100 MG tablet Take 1 tablet by mouth once daily 90 tablet 1   nitroGLYCERIN (NITROSTAT) 0.4 MG  SL tablet DISSOLVE ONE TABLET UNDER THE TONGUE EVERY 5 MINUTES AS NEEDED FOR CHEST PAIN.  DO NOT EXCEED A TOTAL OF 3 DOSES IN 15 MINUTES 25 tablet 8   pantoprazole (PROTONIX) 40 MG tablet Take 1 tablet (40 mg total) by mouth 2 (two) times daily. 60 tablet 0   ranolazine (RANEXA) 500 MG 12 hr tablet Take 500 mg by mouth as needed.     spironolactone (ALDACTONE) 25 MG tablet Take 1 tablet by mouth once daily 90 tablet 1   torsemide (DEMADEX) 20 MG tablet Take 4 tablets (80 mg total) by mouth 2 (two) times daily. 180 tablet 11   No current facility-administered medications on file prior to visit.   Allergies  Allergen Reactions   Contrast Media [Iodinated Contrast Media] Hives and Other (See Comments)    Per pt strong burning sensation starting in chest radiating outward    Clonidine Derivatives Other (See Comments)    Throat dry   Statins Other (See Comments)    "bones hurt"   Sulfa Antibiotics Diarrhea    Tremors    Celebrex [Celecoxib] Rash   Isosorbide Nitrate Itching and Rash   Other Itching and Rash    Plastic and paper tape and heart monitor pads   Tape Itching and Rash    Red Where applied and will spread   Social History   Socioeconomic History   Marital status: Married    Spouse name: Not on file   Number of children: Not on file   Years of education: Not on file   Highest education level: Not on file  Occupational History   Not on file  Tobacco Use   Smoking status: Never   Smokeless tobacco: Never  Vaping Use   Vaping Use: Never used  Substance and Sexual Activity   Alcohol use: No   Drug use: No   Sexual activity: Not Currently  Other Topics Concern   Not on file  Social History Narrative   Not on file   Social Determinants of Health   Financial Resource Strain: Not on file  Food Insecurity: Not on file  Transportation Needs: Not on file  Physical Activity: Not on file  Stress: Not on file  Social Connections: Not on file  Intimate Partner Violence:  Not on file      Review of Systems  Musculoskeletal:  Positive for back pain.  All other systems reviewed and are negative.      Objective:   Physical Exam Vitals reviewed.  Constitutional:      General: She is not in acute distress.    Appearance: Normal appearance. She is well-developed and normal weight. She is not diaphoretic.  HENT:     Left Ear: Ear canal normal.     Nose: No congestion or rhinorrhea.     Mouth/Throat:     Mouth: Mucous membranes are moist.     Pharynx: No oropharyngeal exudate or posterior oropharyngeal erythema.  Eyes:     General:        Right eye: No discharge.        Left eye: No discharge.     Conjunctiva/sclera:  Conjunctivae normal.  Neck:     Vascular: No carotid bruit.  Cardiovascular:     Rate and Rhythm: Normal rate and regular rhythm.     Heart sounds: Murmur heard.     No friction rub. No gallop.  Pulmonary:     Effort: Pulmonary effort is normal. No accessory muscle usage, prolonged expiration or respiratory distress.     Breath sounds: Normal breath sounds and air entry. No stridor, decreased air movement or transmitted upper airway sounds. No decreased breath sounds, wheezing, rhonchi or rales.  Chest:     Chest wall: No mass, lacerations, deformity, swelling or crepitus.  Abdominal:     General: Abdomen is flat. Bowel sounds are normal. There is no distension.     Palpations: Abdomen is not rigid.     Tenderness: There is abdominal tenderness in the epigastric area. There is no guarding or rebound.     Hernia: No hernia is present.    Musculoskeletal:     Cervical back: No rigidity or tenderness.     Right lower leg: Edema present.     Left lower leg: Edema present.  Skin:    Findings: No bruising or lesion.  Neurological:     Mental Status: She is alert. Mental status is at baseline.     Motor: No abnormal muscle tone.     Coordination: Coordination normal.     Gait: Gait normal.     Deep Tendon Reflexes: Reflexes  normal.  Psychiatric:        Mood and Affect: Mood normal.        Behavior: Behavior normal.        Thought Content: Thought content normal.        Judgment: Judgment normal.          Assessment & Plan:  Chronic diastolic CHF (congestive heart failure) ([place]) Patient appears stable today.  I see no reason to increase her diuretic at this point.  If worsening, we can certainly do weekly metolazone however at the present time she does not appear fluid overloaded

## 2022-04-19 NOTE — Telephone Encounter (Signed)
PA-lidocaine 5% was denied.   Tried to appeal, Denied. However, per appeal department, it does not cover under per plan.   Appeal stated that pt can try Lidocaine 4% OTC  Pls advice?

## 2022-04-20 NOTE — Telephone Encounter (Signed)
Call spoke w/pt this morning, Per Dr. Dennard Schaumann, suggest to tried the Lidocaine 4% OTC to see if it helps with pain.   Per pt voiced understanding. Nothing at this time.

## 2022-04-26 ENCOUNTER — Other Ambulatory Visit: Payer: Self-pay | Admitting: Family Medicine

## 2022-04-26 DIAGNOSIS — F064 Anxiety disorder due to known physiological condition: Secondary | ICD-10-CM

## 2022-04-26 DIAGNOSIS — K5792 Diverticulitis of intestine, part unspecified, without perforation or abscess without bleeding: Secondary | ICD-10-CM

## 2022-04-27 ENCOUNTER — Other Ambulatory Visit (HOSPITAL_COMMUNITY): Payer: Self-pay

## 2022-04-29 ENCOUNTER — Telehealth (HOSPITAL_COMMUNITY): Payer: Self-pay

## 2022-04-29 ENCOUNTER — Ambulatory Visit: Payer: Medicare Other | Admitting: Cardiology

## 2022-04-29 ENCOUNTER — Telehealth (HOSPITAL_COMMUNITY): Payer: Self-pay | Admitting: Pharmacy Technician

## 2022-04-29 ENCOUNTER — Other Ambulatory Visit (HOSPITAL_COMMUNITY): Payer: Self-pay

## 2022-04-29 NOTE — Telephone Encounter (Signed)
Advanced Heart Failure Patient Advocate Encounter  Called and spoke with patient. Made her aware of Eliquis samples. Went over 3% OOP requirement for [employer] assistance. She will inquire into the report the next time she refills a medication. Advised her to call back after obtaining the report to determine if we could gain approval from [employer].  Charlann Boxer, CPhT

## 2022-04-29 NOTE — Telephone Encounter (Signed)
Patient called stating that she couldn't afford her Eliquis. I advised patient that I could give her some samples. Benjamine Mola is there something further we can do to assist this patient?    Medication Samples have been provided to the patient.  Drug name: Eliquis       Strength: '5mg'$         Qty: 4  LOT: [id number]  Exp.Date: 12/2023  Dosing instructions: take 1 tab po bid  The patient has been instructed regarding the correct time, dose, and frequency of taking this medication, including desired effects and most common side effects.   Lysbeth Dicola R Besnik Febus 57:49 PM 04/29/2022

## 2022-05-28 DIAGNOSIS — H02052 Trichiasis without entropian right lower eyelid: Secondary | ICD-10-CM | POA: Diagnosis not present

## 2022-06-01 ENCOUNTER — Ambulatory Visit (INDEPENDENT_AMBULATORY_CARE_PROVIDER_SITE_OTHER): Payer: Medicare Other | Admitting: Family Medicine

## 2022-06-01 VITALS — BP 132/72 | HR 64 | Temp 98.5°F | Ht 63.0 in | Wt 177.0 lb

## 2022-06-01 DIAGNOSIS — J029 Acute pharyngitis, unspecified: Secondary | ICD-10-CM

## 2022-06-01 MED ORDER — LEVOCETIRIZINE DIHYDROCHLORIDE 5 MG PO TABS
5.0000 mg | ORAL_TABLET | Freq: Every evening | ORAL | 0 refills | Status: DC
Start: 2022-06-01 — End: 2022-11-18

## 2022-06-01 NOTE — Progress Notes (Signed)
Subjective:   Patient reports a sore throat for 1 week.  She reports postnasal drip and drainage.  She states that she can feel it draining down her throat into her stomach upsetting her stomach.  She has not been taking the Flonase.  She states that she has been taking her allergy pill but is not working.  She states the Mucinex does not help but she has not been taking it.  She denies any sinus pain or sinus pressure.  On examination today there is no sinus tenderness.  There is no erythema or exudate in the posterior oropharynx.  There is no lymphadenopathy in the neck.  Her lungs are clear to auscultation bilaterally.  Past Medical History:  Diagnosis Date  . Allergy    rhinitis  . Aortic stenosis    mild by echo 06/2017  . Arthritis   . Bradycardia    a. 10/2017 -> beta blocker cut back due to HR 39.  . Breast cancer ([place]) 01/06/2012  . Cancer [facility])    right colon and left breast  . Chronic diastolic CHF (congestive heart failure) ([place])   . Colon cancer ([place]) 01/06/2012  . Colovesical fistula    Dr. Marlou Starks and [name] planning surgery 08/2018- surgery revealed spontaneous closure  . COPD (chronic obstructive pulmonary disease) ([place])    pt. denies  . Coronary artery disease 2006   a.  NSTEMI in 2016, cath showed 15% prox-mid RCA, 20% prox LAD, EF 25-35% by cath and 35-40% -> felt due to Takotsubo cardiomyopathy.  . Dilated aortic root ([place])    89mHg by echo 06/2017  . Diverticulosis   . Dyspnea   . Edema extremities   . GERD (gastroesophageal reflux disease)   . Hernia   . Hiatal hernia    denies  . Hyperlipidemia   . Hypertension   . Mild aortic stenosis    echo 11/2015 but not noted on echo 06/2016  . Osteopenia   . Permanent atrial fibrillation (H[facility])    chronic atrial fibrillation  . Pneumonia    hx child  . Pulmonary HTN ([place]    a. moderate to severe PASP 626mg echo 11/2015 - now 4133m by echo 06/2017. CTA chest in 11/16 with no PE. PFTs in 7/15 with mild  obstructive lung disease. She had a negative sleep study in 2017. b. Felt due to left sided HF.  . [place] Kitchenroke ([facility] . Takotsubo syndrome 07/29/2015   a. EF 35-40% by echo; akinesis of mid-apical anteroseptal and apical myocardium.  EF now normalized on echo 11/2015   Past Surgical History:  Procedure Laterality Date  . APPENDECTOMY    . BREAST SURGERY     lumpectomy left  . CARDIAC CATHETERIZATION    . CARDIAC CATHETERIZATION N/A 07/28/2015   Procedure: Left Heart Cath and Coronary Angiography;  Surgeon: Peter M JorMartiniqueD;  Location: [facility];  Service: Cardiovascular;  Laterality: N/A;  . CHOLECYSTECTOMY    . COLECTOMY     right side  . CYSTOSCOPY WITH STENT PLACEMENT Left 09/04/2018   Procedure: CYSTOSCOPY WITH LEFT STENT PLACEMENT, BLADDER REPAIR, CYSTOSCOPY WITH LEFT STENT REMOVAL;  Surgeon: BorRaynelle BringD;  Location: [facility];  Service: Urology;  Laterality: Left;  . EXCISION OF ACCESSORY NIPPLE Bilateral 05/30/2013   Procedure: BILATERAL NIPPLE BIOPSY;  Surgeon: PauMerrie RoofD;  [facility];  Laterality: Bilateral;  . EYE SURGERY Bilateral 12   cataracts  . HYSTEROSCOPY  WITH D & C N/A 05/29/2020   Procedure: DILATATION AND CURETTAGE /HYSTEROSCOPY;  Surgeon: Homero Fellers, MD;  Location: [facility];  Service: Gynecology;  Laterality: N/A;  . IR RADIOLOGIST EVAL & MGMT  07/26/2017  . LAPAROSCOPIC RIGHT COLECTOMY N/A 09/04/2018   Procedure: LAPAROSCOPIC ASSISTED SIGMOID COLECTOMY WITH REPAIR OF FISTULA TO BLADDER;  Surgeon: Jovita Kussmaul, MD;  Location: [facility];  Service: General;  Laterality: N/A;  . RIGHT/LEFT HEART CATH AND CORONARY ANGIOGRAPHY N/A 12/10/2020   Procedure: RIGHT/LEFT HEART CATH AND CORONARY ANGIOGRAPHY;  Surgeon: Larey Dresser, MD;  Location: [facility];  Service: Cardiovascular;  Laterality: N/A;  . SPLIT NIGHT STUDY  02/02/2016       Current Outpatient Medications on File Prior to Visit  Medication Sig Dispense  Refill  . acetaminophen (TYLENOL) 500 MG tablet Take 1,000 mg by mouth every 6 (six) hours as needed for headache or moderate pain.    Marland Kitchen amLODipine (NORVASC) 5 MG tablet Take 1 tablet (5 mg total) by mouth daily. 90 tablet 3  . apixaban (ELIQUIS) 5 MG TABS tablet Take 1 tablet (5 mg total) by mouth 2 (two) times daily. 60 tablet 6  . Azelastine HCl 137 MCG/SPRAY SOLN Place 1 spray into both nostrils 2 (two) times daily as needed (congestion).    . Cholecalciferol (VITAMIN D3) 25 MCG (1000 UT) CAPS Take 1,000 Units by mouth once a week.    . clobetasol ointment (TEMOVATE) 0.99 % Apply 1 Application topically daily as needed (irritation).    . clonazePAM (KLONOPIN) 0.5 MG tablet Take 1 tablet by mouth twice daily as needed for anxiety 60 tablet 0  . cloNIDine (CATAPRES) 0.1 MG tablet Take 1 tablet (0.1 mg total) by mouth 2 (two) times daily. 180 tablet 2  . famotidine (PEPCID) 20 MG tablet Take 20 mg by mouth at bedtime as needed for heartburn.    . ferrous sulfate 325 (65 FE) MG tablet Take 1 tablet (325 mg total) by mouth daily with breakfast. 90 tablet 3  . fluticasone (FLONASE) 50 MCG/ACT nasal spray Place 1 spray into both nostrils daily as needed for allergies or rhinitis.    Marland Kitchen losartan (COZAAR) 100 MG tablet Take 1 tablet by mouth once daily 90 tablet 1  . nitroGLYCERIN (NITROSTAT) 0.4 MG SL tablet DISSOLVE ONE TABLET UNDER THE TONGUE EVERY 5 MINUTES AS NEEDED FOR CHEST PAIN.  DO NOT EXCEED A TOTAL OF 3 DOSES IN 15 MINUTES 25 tablet 8  . pantoprazole (PROTONIX) 40 MG tablet Take 1 tablet (40 mg total) by mouth 2 (two) times daily. 60 tablet 0  . ranolazine (RANEXA) 500 MG 12 hr tablet Take 500 mg by mouth as needed.    Marland Kitchen spironolactone (ALDACTONE) 25 MG tablet Take 1 tablet by mouth once daily 90 tablet 1  . torsemide (DEMADEX) 20 MG tablet Take 4 tablets (80 mg total) by mouth 2 (two) times daily. 180 tablet 11   No current facility-administered medications on file prior to visit.    Allergies  Allergen Reactions  . Contrast Media [Iodinated Contrast Media] Hives and Other (See Comments)    Per pt strong burning sensation starting in chest radiating outward   . Clonidine Derivatives Other (See Comments)    Throat dry  . Statins Other (See Comments)    "bones hurt"  . Sulfa Antibiotics Diarrhea    Tremors   . Celebrex [Celecoxib] Rash  . Isosorbide Nitrate Itching and Rash  . Other Itching and  Rash    Plastic and paper tape and heart monitor pads  . Tape Itching and Rash    Red Where applied and will spread   Social History   Socioeconomic History  . Marital status: Married    Spouse name: Not on file  . Number of children: Not on file  . Years of education: Not on file  . Highest education level: Not on file  Occupational History  . Not on file  Tobacco Use  . Smoking status: Never  . Smokeless tobacco: Never  Vaping Use  . Vaping Use: Never used  Substance and Sexual Activity  . Alcohol use: No  . Drug use: No  . Sexual activity: Not Currently  Other Topics Concern  . Not on file  Social History Narrative  . Not on file   Social Determinants of Health   Financial Resource Strain: Not on file  Food Insecurity: Not on file  Transportation Needs: Not on file  Physical Activity: Not on file  Stress: Not on file  Social Connections: Not on file  Intimate Partner Violence: Not on file      Review of Systems  Musculoskeletal:  Positive for back pain.  All other systems reviewed and are negative.      Objective:   Physical Exam Vitals reviewed.  Constitutional:      General: She is not in acute distress.    Appearance: Normal appearance. She is well-developed and normal weight. She is not ill-appearing, toxic-appearing or diaphoretic.  HENT:     Head: Normocephalic and atraumatic.     Right Ear: Tympanic membrane and ear canal normal.     Left Ear: Tympanic membrane and ear canal normal.     Nose: No congestion or rhinorrhea.      Mouth/Throat:     Mouth: Mucous membranes are moist. No oral lesions.     Pharynx: Oropharynx is clear. No pharyngeal swelling, oropharyngeal exudate, posterior oropharyngeal erythema or uvula swelling.     Tonsils: No tonsillar exudate.  Eyes:     General:        Right eye: No discharge.        Left eye: No discharge.     Conjunctiva/sclera: Conjunctivae normal.  Neck:     Vascular: No carotid bruit.  Cardiovascular:     Rate and Rhythm: Normal rate and regular rhythm.     Heart sounds: Murmur heard.     No friction rub. No gallop.  Pulmonary:     Effort: Pulmonary effort is normal. No accessory muscle usage, prolonged expiration or respiratory distress.     Breath sounds: Normal breath sounds and air entry. No stridor, decreased air movement or transmitted upper airway sounds. No decreased breath sounds, wheezing, rhonchi or rales.  Chest:     Chest wall: No mass, lacerations, deformity, swelling or crepitus.  Abdominal:     General: Abdomen is flat. Bowel sounds are normal. There is no distension.     Palpations: Abdomen is not rigid.     Tenderness: There is no abdominal tenderness. There is no guarding or rebound.     Hernia: No hernia is present.  Musculoskeletal:     Cervical back: Normal range of motion and neck supple. No rigidity or tenderness.     Right lower leg: Edema present.     Left lower leg: Edema present.  Lymphadenopathy:     Cervical: No cervical adenopathy.  Skin:    Findings: No bruising or lesion.  Neurological:  Mental Status: She is alert. Mental status is at baseline.     Motor: No abnormal muscle tone.     Coordination: Coordination normal.     Gait: Gait normal.     Deep Tendon Reflexes: Reflexes normal.  Psychiatric:        Mood and Affect: Mood normal.        Behavior: Behavior normal.        Thought Content: Thought content normal.        Judgment: Judgment normal.         Assessment & Plan:  Sore throat Patient's exam today is  benign.  I believe the sore throat is most likely due to drainage and irritation.  Another possibility would be silent reflux.  She is already on a proton pump inhibitor pantoprazole.  I have recommended that she try Mucinex every 12 hours to help thin the mucus, start Xyzal 5 mg daily to help dry up the mucus and drainage and use Flonase daily to help dry up mucus and drainage.  I believe in 3 to 4 days of sore throat should be better by alleviating the source of the sore throat.  If the sore throat is getting worse she can recheck with me at any time.  She questions if she needs penicillin but I see no indication today on exam she has a serious bacterial infection requiring antibiotics.

## 2022-06-10 ENCOUNTER — Other Ambulatory Visit: Payer: Self-pay | Admitting: Family Medicine

## 2022-06-10 DIAGNOSIS — F064 Anxiety disorder due to known physiological condition: Secondary | ICD-10-CM

## 2022-06-10 DIAGNOSIS — K5792 Diverticulitis of intestine, part unspecified, without perforation or abscess without bleeding: Secondary | ICD-10-CM

## 2022-06-10 NOTE — Telephone Encounter (Signed)
Requested medication (s) are due for refill today -yes  Requested medication (s) are on the active medication list -yes  Future visit scheduled -no  Last refill: 04/26/22 #60  Notes to clinic: non delegated Rx  Requested Prescriptions  Pending Prescriptions Disp Refills   clonazePAM (KLONOPIN) 0.5 MG tablet [Pharmacy Med Name: clonazePAM 0.5 MG Oral Tablet] 60 tablet 0    Sig: Take 1 tablet by mouth twice daily as needed for anxiety     Not Delegated - Psychiatry: Anxiolytics/Hypnotics 2 Failed - 06/10/2022 11:56 AM      Failed - This refill cannot be delegated      Failed - Urine Drug Screen completed in last 360 days      Passed - Patient is not pregnant      Passed - Valid encounter within last 6 months    Recent Outpatient Visits           5 months ago Chronic diastolic CHF (congestive heart failure) ([place])   [place] [name], Cammie Mcgee, MD   8 months ago Dyspnea, unspecified type   [place] [name], MD   8 months ago Dyspnea, unspecified type   [place] [name], MD   8 months ago Chest wall pain   [place] Dennard Schaumann, Cammie Mcgee, MD   10 months ago Anemia, unspecified type   [place], Cammie Mcgee, MD       Future Appointments             In 4 months Turner, Eber Hong, MD [place]. [facility]               Requested Prescriptions  Pending Prescriptions Disp Refills   clonazePAM (KLONOPIN) 0.5 MG tablet [Pharmacy Med Name: clonazePAM 0.5 MG Oral Tablet] 60 tablet 0    Sig: Take 1 tablet by mouth twice daily as needed for anxiety     Not Delegated - Psychiatry: Anxiolytics/Hypnotics 2 Failed - 06/10/2022 11:56 AM      Failed - This refill cannot be delegated      Failed - Urine Drug Screen completed in last 360 days      Passed - Patient is not pregnant      Passed - Valid  encounter within last 6 months    Recent Outpatient Visits           5 months ago Chronic diastolic CHF (congestive heart failure) ([place])   [place] [name], Cammie Mcgee, MD   8 months ago Dyspnea, unspecified type   [place] [name], MD   8 months ago Dyspnea, unspecified type   [place] [name], MD   8 months ago Chest wall pain   [place] [name], Cammie Mcgee, MD   10 months ago Anemia, unspecified type   [place] [name], Cammie Mcgee, MD       Future Appointments             In 4 months Turner, Eber Hong, MD [place]. [facility], [facility]

## 2022-06-17 NOTE — Progress Notes (Signed)
Chronic Care Management Pharmacy Note  07/02/2022 Name:  [name] MRN:  [id number] DOB:  1935-07-10  Summary: PharmD FU.  Patient having some chest pain - started on Ranexa, she says it has helped some but still having occasional pains in chest.  Checked her Eliquis dose with her Scr and age she qualifies for reduced dose of 2.69m bid - low Hgb so we Taul consider adjustment at this time.  Recommendations: Decrease Eliquis to 2.577mBID based on recent CMP  FU 30 days with CMA PharmD 4 months   Subjective: [name] is an [age].o. year old female who is a primary patient of [name], WaCammie McgeeMD.  The CCM team was consulted for assistance with disease management and care coordination needs.    Engaged with patient by telephone for initial visit in response to provider referral for pharmacy case management and/or care coordination services.   Consent to Services:  The patient was given the following information about Chronic Care Management services today, agreed to services, and gave verbal consent: 1. CCM service includes personalized support from designated clinical staff supervised by the primary care provider, including individualized plan of care and coordination with other care providers 2. 24/7 contact phone numbers for assistance for urgent and routine care needs. 3. Service will only be billed when office clinical staff spend 20 minutes or more in a month to coordinate care. 4. Only one practitioner Mathison furnish and bill the service in a calendar month. 5.The patient Dowis stop CCM services at any time (effective at the end of the month) by phone call to the office staff. 6. The patient will be responsible for cost sharing (co-pay) of up to 20% of the service fee (after annual deductible is met). Patient agreed to services and consent obtained.  Patient Care Team: PiSusy FrizzleMD as PCP - General (Family Medicine) TuSueanne MargaritaMD as PCP - Cardiology (Cardiology) McLarey DresserMD as PCP - Advanced Heart Failure (Cardiology) DaEdythe ClarityRPChrist[facility] Pharmacist (Pharmacist)  Recent office visits:  10/08/21 WaJenna LuoMD - Family Medicine - Dyspnea - Labs were ordered. Reduced fluid pill back to his original dose of 80 mg a day. Follow up as scheduled.    10/05/21 WaJenna LuoMD - Dyspnea - Labs were ordered. EKG performed. Increase torsemide from 80 mg in the morning to 80 mg in the morning coupled with 40 mg in the afternoon and reassess the patient on Thursday.    09/15/21 WaJenna LuoMD - Family Medicine - Chest wall pain - predniSONE (DELTASONE) 20 MG tablet prescribed. Norco 5/325 1 p.o. every 6 hours for severe pain prescribed. Follow up if no improvement.    08/11/21 WaJenna LuoMDNewton Fallsere ordered. clobetasol ointment (TEMOVATE) 0.05 % prescribed. Follow up as scheduled.    07/14/21 WaJenna LuoMD - Family Medicine - General Exam - emphasized the importance of taking calcium 1200 mg daily and vitamin D 1000 units daily. Recommended both the shingles and COVID vaccine. Follow up as scheduled.      Recent consult visits:  08/26/21 JeAllena KatzNP - Cardiology - CHF - Labs were ordered. No medication changes. Follow up in 3 months.      Hospital visits:  None in previous 6 months     None in the last six months   Objective:  Lab Results  Component Value Date   CREATININE 1.51 (H) 04/13/2022  BUN 46 (H) 04/13/2022   GFR 90.44 01/27/2015   GFRNONAA 34 (L) 04/13/2022   GFRAA 60 07/03/2020   NA 136 04/13/2022   K 4.1 04/13/2022   CALCIUM 9.4 04/13/2022   CO2 29 04/13/2022   GLUCOSE 117 (H) 04/13/2022    Lab Results  Component Value Date/Time   HGBA1C 5.9 (H) 03/14/2014 09:43 AM   GFR 90.44 01/27/2015 11:48 AM   GFR 79.60 02/15/2014 10:34 AM    Last diabetic Eye exam: No results found for: "HMDIABEYEEXA"  Last diabetic Foot exam: No results found for: "HMDIABFOOTEX"   Lab Results   Component Value Date   CHOL 90 12/01/2021   HDL 28 (L) 12/01/2021   LDLCALC 52 12/01/2021   TRIG 49 12/01/2021   CHOLHDL 3.2 12/01/2021       Latest Ref Rng & Units 03/15/2022   12:13 PM 02/23/2022   11:10 AM 10/05/2021   11:18 AM  Hepatic Function  Total Protein 6.5 - 8.1 g/dL 7.2  6.9  6.6   Albumin 3.5 - 5.0 g/dL 3.7     AST 15 - 41 U/L 20  12  24    ALT 0 - 44 U/L 11  8  19    Alk Phosphatase 38 - 126 U/L 68     Total Bilirubin 0.3 - 1.2 mg/dL 0.6  0.4  0.6     Lab Results  Component Value Date/Time   TSH 3.878 12/01/2021 11:16 AM   TSH 2.20 07/08/2016 09:58 AM   TSH 1.046 02/26/2013 10:09 AM       Latest Ref Rng & Units 03/17/2022    4:55 AM 03/16/2022    6:58 AM 03/15/2022   12:13 PM  CBC  WBC 4.0 - 10.5 K/uL 6.9  8.3  10.0   Hemoglobin 12.0 - 15.0 g/dL 10.2  9.7  10.1   Hematocrit 36.0 - 46.0 % 31.8  29.7  31.7   Platelets 150 - 400 K/uL 237  206  240     Lab Results  Component Value Date/Time   VD25OH 33 02/09/2013 09:47 AM    Clinical ASCVD: Yes  The ASCVD Risk score (Arnett DK, et al., 2019) failed to calculate for the following reasons:   The 2019 ASCVD risk score is only valid for ages 6 to 66   The patient has a prior MI or stroke diagnosis       07/14/2021    9:32 AM 09/30/2020   12:32 PM 07/03/2020    9:43 AM  Depression screen PHQ 2/9  Decreased Interest 0 0 0  Down, Depressed, Hopeless 0 0 0  PHQ - 2 Score 0 0 0     Social History   Tobacco Use  Smoking Status Never  Smokeless Tobacco Never   BP Readings from Last 3 Encounters:  06/01/22 132/72  04/16/22 (!) 130/48  04/13/22 (!) 140/60   Pulse Readings from Last 3 Encounters:  06/01/22 64  04/16/22 (!) 58  04/13/22 63   Wt Readings from Last 3 Encounters:  06/01/22 177 lb (80.3 kg)  04/16/22 176 lb (79.8 kg)  04/13/22 173 lb 3.2 oz (78.6 kg)   BMI Readings from Last 3 Encounters:  06/01/22 31.35 kg/m  04/16/22 31.18 kg/m  04/13/22 30.68 kg/m     Assessment/Interventions: Review of patient past medical history, allergies, medications, health status, including review of consultants reports, laboratory and other test data, was performed as part of comprehensive evaluation and provision of chronic care management services.  SDOH:  (Social Determinants of Health) assessments and interventions performed: Yes  Financial Resource Strain: Low Risk  (07/02/2022)   Overall Financial Resource Strain (CARDIA)    Difficulty of Paying Living Expenses: Not very hard    Financial Resource Strain: Low Risk  (07/02/2022)   Overall Financial Resource Strain (CARDIA)    Difficulty of Paying Living Expenses: Not very hard    SDOH Screenings   Food Insecurity: No Food Insecurity (07/02/2022)  Alcohol Screen: Low Risk  (07/14/2021)  Depression (PHQ2-9): Low Risk  (07/14/2021)  Financial Resource Strain: Low Risk  (07/02/2022)  Tobacco Use: Low Risk  (06/23/2022)    CCM Care Plan  Allergies  Allergen Reactions   Contrast Media [Iodinated Contrast Media] Hives and Other (See Comments)    Per pt strong burning sensation starting in chest radiating outward    Clonidine Derivatives Other (See Comments)    Throat dry   Statins Other (See Comments)    "bones hurt"   Sulfa Antibiotics Diarrhea    Tremors    Celebrex [Celecoxib] Rash   Isosorbide Nitrate Itching and Rash   Other Itching and Rash    Plastic and paper tape and heart monitor pads   Tape Itching and Rash    Red Where applied and will spread    Medications Reviewed Today     Reviewed by Edythe Clarity, [facility] (Pharmacist) on 07/02/22 at [place] List Status: <None>   Medication Order Taking? Sig Documenting Provider Last Dose Status Informant  acetaminophen (TYLENOL) 500 MG tablet [id number] Yes Take 1,000 mg by mouth every 6 (six) hours as needed for headache or moderate pain. [provider] Taking Active Self  amLODipine (NORVASC) 5 MG tablet [id number] Yes Take  1 tablet (5 mg total) by mouth daily. [place], Maricela Bo, FNP Taking Active Self  apixaban (ELIQUIS) 5 MG TABS tablet [id number] Yes Take 1 tablet (5 mg total) by mouth 2 (two) times daily. Larey Dresser, MD Taking Active Self  Azelastine HCl 137 MCG/SPRAY SOLN [id number] Yes Place 1 spray into both nostrils 2 (two) times daily as needed (congestion). [provider] Taking Active Self  Cholecalciferol (VITAMIN D3) 25 MCG (1000 UT) CAPS [id number] Yes Take 1,000 Units by mouth once a week. [provider] Taking Active Self  clobetasol ointment (TEMOVATE) 0.05 % [id number] Yes Apply 1 Application topically daily as needed (irritation). [provider] Taking Active Self  clonazePAM (KLONOPIN) 0.5 MG tablet [id number] Yes Take 1 tablet by mouth twice daily as needed for anxiety [name], MD Taking Active   cloNIDine (CATAPRES) 0.1 MG tablet [id number] Yes Take 1 tablet (0.1 mg total) by mouth 2 (two) times daily. [name], MD Taking Active Self  famotidine (PEPCID) 20 MG tablet [id number] Yes Take 20 mg by mouth at bedtime as needed for heartburn. [provider] Taking Active Self  ferrous sulfate 325 (65 FE) MG tablet [id number] Yes Take 1 tablet (325 mg total) by mouth daily with breakfast. [name], MD Taking Active Self  fluticasone (FLONASE) 50 MCG/ACT nasal spray [id number] Yes Place 1 spray into both nostrils daily as needed for allergies or rhinitis. [provider] Taking Active Self  levocetirizine (XYZAL ALLERGY 24HR) 5 MG tablet [id number] Yes Take 1 tablet (5 mg total) by mouth every evening. [name], MD Taking Active   losartan (COZAAR) 100 MG tablet [id number] Yes Take 1 tablet by mouth once daily Turner, Eber Hong, MD Taking Active  nitroGLYCERIN (NITROSTAT) 0.4 MG SL tablet [id number] Yes DISSOLVE ONE TABLET UNDER THE TONGUE EVERY 5 MINUTES AS NEEDED FOR CHEST PAIN.  DO NOT EXCEED A TOTAL OF 3 DOSES IN 15 MINUTES  Turner, Eber Hong, MD Taking Active Self  pantoprazole (PROTONIX) 40 MG tablet [id number]  Take 1 tablet (40 mg total) by mouth 2 (two) times daily. Darliss Cheney, MD  Expired 04/16/22 2359   ranolazine (RANEXA) 500 MG 12 hr tablet [id number] Yes Take 500 mg by mouth as needed. [provider] Taking Active   spironolactone (ALDACTONE) 25 MG tablet [id number] Yes Take 1 tablet by mouth once daily Turner, Eber Hong, MD Taking Active Self  torsemide (DEMADEX) 20 MG tablet [id number] Yes Take 4 tablets (80 mg total) by mouth 2 (two) times daily. Partridge, Elaine, [place] Taking Active Self           Med Note [facility] [place]   Tue Apr 13, 2022  2:02 PM)              Patient Active Problem List   Diagnosis Date Noted   Abnormal results on imaging study of genitourinary system 03/25/2022   NSTEMI (non-ST elevated myocardial infarction) ([place])    Persistent atrial fibrillation ([place]) 03/15/2022   Chronic kidney disease, stage 3b ([place]) 03/15/2022   Elevated troponin 03/15/2022   Postmenopausal bleeding 06/05/2020   Chest pain 07/23/2019   Diverticulitis large intestine w/o perforation or abscess w/o bleeding 09/04/2018   Colovesical fistula    Preoperative clearance 08/07/2018   TIA (transient ischemic attack) 01/06/2018   Breast cancer ([place]) 09/13/2017   Obesity, unspecified 09/13/2017   Warfarin anticoagulation 09/13/2017   Uterine mass 09/07/2017   Adnexal mass 08/03/2017   Pelvic abscess in female 07/07/2017   Aortic stenosis    Dyslipidemia, goal LDL below 70 11/28/2016   Monitoring for long-term anticoagulant use 12/31/2015   Chronic diastolic CHF (congestive heart failure) ([place]) 10/28/2015   Insomnia 10/23/2015   Leg swelling 08/11/2015   Takotsubo syndrome 07/29/2015   DOE (dyspnea on exertion) 08/15/2014   Dilated aortic root ([place])    Pulmonary HTN ([place])    Permanent atrial fibrillation ([place]) 07/04/2013   Chronic anticoagulation 07/04/2013   Coronary artery disease     Lesion of nipple 05/15/2013   GERD (gastroesophageal reflux disease)    Hypertension    COPD (chronic obstructive pulmonary disease) ([place])    Osteopenia    Diverticulosis    Hiatal hernia    Abdominal pain 01/11/2012   Breast cancer of upper-outer quadrant of left female breast ([place]) 01/06/2012   Colon cancer ([place]) 01/06/2012    Immunization History  Administered Date(s) Administered   Fluad Quad(high Dose 65+) 06/18/2019, 07/03/2020, 07/02/2021   Influenza Split 07/07/2010   Influenza, High Dose Seasonal PF 07/14/2018   Influenza,inj,Quad PF,6+ Mos 07/25/2013, 05/19/2015, 07/08/2016   Meningococcal Polysaccharide 07/08/2007   PFIZER(Purple Top)SARS-COV-2 Vaccination 10/01/2019, 10/22/2019, 07/13/2020   Pneumococcal Conjugate-13 03/14/2014   Pneumococcal Polysaccharide-23 07/15/2005   Td 04/06/2010   Tdap 05/15/2020   Zoster, Live 02/10/2015    Conditions to be addressed/monitored:  HTN, CAD, Afib, COPD, Osteopenia, HLD, Breast cancer  Care Plan : General Pharmacy (Adult)  Updates made by Edythe Clarity, RPH since 07/02/2022 12:00 AM     Problem: HTN, HLD, Afib, Osteoporosis   Priority: High  Onset Date: 06/18/2021     Long-Range Goal: Patient-Specific Goal   Start Date: 06/18/2021  Expected End Date: 12/17/2021  Recent Progress: On track  Priority:  High  Note:   Current Barriers:  Unable to independently afford treatment regimen Unable to achieve control of BP   Pharmacist Clinical Goal(s):  Patient will verbalize ability to afford treatment regimen achieve adherence to monitoring guidelines and medication adherence to achieve therapeutic efficacy achieve improvement in BP as evidenced by monitoring through collaboration with PharmD and provider.   Interventions: 1:1 collaboration with [name], MD regarding development and update of comprehensive plan of care as evidenced by provider attestation and co-signature Inter-disciplinary care team  collaboration (see longitudinal plan of care) Comprehensive medication review performed; medication list updated in electronic medical record  Hypertension (BP goal <140/90) -Not ideally controlled -Current treatment: Amlodipine 105m daily Appropriate, Effective, Safe, Accessible Clonidine 0.136mBID  Appropriate, Effective, Safe, Accessible Losartan 10044maily  Appropriate, Effective, Safe, Accessible Spironolactone 50m86mily  Appropriate, Effective, Safe, Accessible -Medications previously tried: metoprolol, valsartan  -Current home readings: 182/73 P 62, 165/62 P 61 -Current exercise habits: minimal due to having to take care of her husband -Denies hypotensive/hypertensive symptoms -Educated on BP goals and benefits of medications for prevention of heart attack, stroke and kidney damage; Daily salt intake goal < 2300 mg; Exercise goal of 150 minutes per week; Importance of home blood pressure monitoring; -Counseled to monitor BP at home weekly, document, and provide log at future appointments -Recommended to continue current medication Have asked patient to monitor and record, will f/u on BP readings next month  Update 12/24/21 BP elevated last time in the office.  The two times before that her BP was a little bit on the lower side.  No monitoring much at home. If BP remains elevated could consider increasing amlodipine to 10mg49mly. At this time would make not changes just continue to monitor.  Hyperlipidemia/CAD: (LDL goal < 70) -Controlled -Current treatment: None -Medications previously tried: none noted  -LDL is normal at last lipid panel -Educated on Cholesterol goals;  Importance of limiting foods high in cholesterol; Exercise goal of 150 minutes per week; -Recommended to continue current medication  Chest Pain? (Goal: Minimize symptoms) 07/01/22 -Query Controlled -Current treatment  Ranolazine 500mg 69m bid Appropriate, Query effective, -Medications previously  tried: none noted -Recently started for chest pain, unclear if it is working she is still having some chest pain  -Recommended to continue current medication -Continue to monitor   Atrial Fibrillation (Goal: prevent stroke and major bleeding) 07/01/22 -Controlled -CHADSVASC: 8 -Current treatment: Rate control: none Anticoagulation: Eliquis 5mg BI79mppropriate, Effective, QUERY Safe -Medications previously tried: metoprolol -Home BP and HR readings: HR is well controlled  -Counseled on increased risk of stroke due to Afib and benefits of anticoagulation for stroke prevention; bleeding risk associated with Eliquis and importance of self-monitoring for signs/symptoms of bleeding; seeking medical attention after a head injury or if there is blood in the urine/stool; Based on her most recent CMP her Scr is 1.5.  She is also > 82 year30old so the recommended dose of her Eliquis is 2.5mg BID80mDue to her low Hgb thi Petti be appropriate for the patient at this time - will consult with PCP.  Update 12/24/21 Eliquis dose still appropriate based on most recent Scr.  Her weight remains above level to decrease dose as well. No changes at this time, continue to monitor renal function for need to adjust dose. Patient followed regularly by cardiology.   Osteoporosis / Osteopenia (Goal Prevent fractures/maintain bone density) -Not ideally controlled, not assessed -Last DEXA Scan: 02/04/21   T-Score  femoral neck: -3.0  T-Score lumbar spine: -3.0 -Patient is a candidate for pharmacologic treatment due to T-Score < -2.5 in lumbar spine -Current treatment  None -Medications previously tried: Fosamax (throat irritation), Prolia (unclear if she started due to $$)  -Recommend [phone] units of vitamin D daily. Recommend 1200 mg of calcium daily from dietary and supplemental sources. Recommend weight-bearing and muscle strengthening exercises for building and maintaining bone density. -Patient is currently  having some jaw pain.  She took one dose of Fosamax and her jaw pain started about 4 weeks later.  Advised her to see a dentist to see if she can get X-rays of the area her pain is.  This could also be contributing to her increase in BP.  Would recommend resolving jaw pain before discussing treatment options for osteoporosis.  Patient agreeable to this.   Patient Goals/Self-Care Activities Patient will:  - take medications as prescribed focus on medication adherence by pill box collaborate with provider on medication access solutions  Follow Up Plan: The care management team will reach out to the patient again over the next 180 days.               Medication Assistance: Application for Eliquis  medication assistance program. in process.  Anticipated assistance start date unknown.  See plan of care for additional detail.  Compliance/Adherence/Medication fill history: Care Gaps: Zoster Vaccine  Star-Rating Drugs: 06/25/2022 90ds Losartan 171m  Patient's preferred pharmacy is:  [address] [address]Phone: [phone]: [phone] [facility] [place]Phone: [phone]: [phone]  Uses pill box? Yes Pt endorses 100% compliance  We discussed: Benefits of medication synchronization, packaging and delivery as well as enhanced pharmacist oversight with [employer]. Patient decided to: Continue current medication management strategy  Care Plan and Follow Up Patient Decision:  Patient agrees to Care Plan and Follow-up.  Plan: The care management team will reach out to the patient again over the next 180 days.  CBeverly Milch PharmD Clinical Pharmacist BJ[phone]

## 2022-06-23 ENCOUNTER — Ambulatory Visit: Payer: Medicare Other | Admitting: Podiatry

## 2022-06-23 ENCOUNTER — Encounter: Payer: Self-pay | Admitting: Podiatry

## 2022-06-23 DIAGNOSIS — M79676 Pain in unspecified toe(s): Secondary | ICD-10-CM

## 2022-06-23 DIAGNOSIS — B351 Tinea unguium: Secondary | ICD-10-CM | POA: Diagnosis not present

## 2022-06-23 DIAGNOSIS — D689 Coagulation defect, unspecified: Secondary | ICD-10-CM

## 2022-06-23 NOTE — Progress Notes (Signed)
She presents today chief complaint of painful ingrown toenails and long toenails that need to be trimmed.  Objective: Vital signs stable alert oriented x3 pulses are palpable.  Toenails are long thick yellow dystrophic onychomycotic.  Assessment: Pain in limb secondary onychomycosis.  Plan: Debridement of toenails 1 through 5 bilateral.

## 2022-07-01 ENCOUNTER — Ambulatory Visit: Payer: Medicare Other | Admitting: Pharmacist

## 2022-07-01 DIAGNOSIS — R0789 Other chest pain: Secondary | ICD-10-CM

## 2022-07-01 DIAGNOSIS — I480 Paroxysmal atrial fibrillation: Secondary | ICD-10-CM

## 2022-07-02 NOTE — Patient Instructions (Addendum)
Visit Information   Goals Addressed             This Visit's Progress    Track and Manage My Blood Pressure-Hypertension   On track    Timeframe:  Long-Range Goal Priority:  High Start Date: 06/18/21                            Expected End Date: 12/17/20                      Follow Up Date 10/19/20    - check blood pressure 3 times per week - choose a place to take my blood pressure (home, clinic or office, retail store) - write blood pressure results in a log or diary    Why is this important?   You won't feel high blood pressure, but it can still hurt your blood vessels.  High blood pressure can cause heart or kidney problems. It can also cause a stroke.  Making lifestyle changes like losing a little weight or eating less salt will help.  Checking your blood pressure at home and at different times of the day can help to control blood pressure.  If the doctor prescribes medicine remember to take it the way the doctor ordered.  Call the office if you cannot afford the medicine or if there are questions about it.     Notes:        Patient Care Plan: General Pharmacy (Adult)     Problem Identified: HTN, HLD, Afib, Osteoporosis   Priority: High  Onset Date: 06/18/2021     Long-Range Goal: Patient-Specific Goal   Start Date: 06/18/2021  Expected End Date: 12/17/2021  Recent Progress: On track  Priority: High  Note:   Current Barriers:  Unable to independently afford treatment regimen Unable to achieve control of BP   Pharmacist Clinical Goal(s):  Patient will verbalize ability to afford treatment regimen achieve adherence to monitoring guidelines and medication adherence to achieve therapeutic efficacy achieve improvement in BP as evidenced by monitoring through collaboration with PharmD and provider.   Interventions: 1:1 collaboration with [name], MD regarding development and update of comprehensive plan of care as evidenced by provider attestation and  co-signature Inter-disciplinary care team collaboration (see longitudinal plan of care) Comprehensive medication review performed; medication list updated in electronic medical record  Hypertension (BP goal <140/90) -Not ideally controlled -Current treatment: Amlodipine '5mg'$  daily Appropriate, Effective, Safe, Accessible Clonidine 0.'1mg'$  BID  Appropriate, Effective, Safe, Accessible Losartan '100mg'$  daily  Appropriate, Effective, Safe, Accessible Spironolactone '25mg'$  daily  Appropriate, Effective, Safe, Accessible -Medications previously tried: metoprolol, valsartan  -Current home readings: 182/73 P 62, 165/62 P 61 -Current exercise habits: minimal due to having to take care of her husband -Denies hypotensive/hypertensive symptoms -Educated on BP goals and benefits of medications for prevention of heart attack, stroke and kidney damage; Daily salt intake goal < 2300 mg; Exercise goal of 150 minutes per week; Importance of home blood pressure monitoring; -Counseled to monitor BP at home weekly, document, and provide log at future appointments -Recommended to continue current medication Have asked patient to monitor and record, will f/u on BP readings next month  Update 12/24/21 BP elevated last time in the office.  The two times before that her BP was a little bit on the lower side.  No monitoring much at home. If BP remains elevated could consider increasing amlodipine to '10mg'$  daily. At this  time would make not changes just continue to monitor.  Hyperlipidemia/CAD: (LDL goal < 70) -Controlled -Current treatment: None -Medications previously tried: none noted  -LDL is normal at last lipid panel -Educated on Cholesterol goals;  Importance of limiting foods high in cholesterol; Exercise goal of 150 minutes per week; -Recommended to continue current medication  Chest Pain? (Goal: Minimize symptoms) 07/01/22 -Query Controlled -Current treatment  Ranolazine '500mg'$  12hr bid Appropriate,  Query effective, -Medications previously tried: none noted -Recently started for chest pain, unclear if it is working she is still having some chest pain  -Recommended to continue current medication -Continue to monitor   Atrial Fibrillation (Goal: prevent stroke and major bleeding) 07/01/22 -Controlled -CHADSVASC: 8 -Current treatment: Rate control: none Anticoagulation: Eliquis '5mg'$  BID Appropriate, Effective, QUERY Safe -Medications previously tried: metoprolol -Home BP and HR readings: HR is well controlled  -Counseled on increased risk of stroke due to Afib and benefits of anticoagulation for stroke prevention; bleeding risk associated with Eliquis and importance of self-monitoring for signs/symptoms of bleeding; seeking medical attention after a head injury or if there is blood in the urine/stool; Based on her most recent CMP her Scr is 1.5.  She is also > 38 years old so the recommended dose of her Eliquis is 2.'5mg'$  BID.  Due to her low Hgb thi Moya be appropriate for the patient at this time - will consult with PCP.  Update 12/24/21 Eliquis dose still appropriate based on most recent Scr.  Her weight remains above level to decrease dose as well. No changes at this time, continue to monitor renal function for need to adjust dose. Patient followed regularly by cardiology.   Osteoporosis / Osteopenia (Goal Prevent fractures/maintain bone density) -Not ideally controlled, not assessed -Last DEXA Scan: 02/04/21   T-Score femoral neck: -3.0  T-Score lumbar spine: -3.0 -Patient is a candidate for pharmacologic treatment due to T-Score < -2.5 in lumbar spine -Current treatment  None -Medications previously tried: Fosamax (throat irritation), Prolia (unclear if she started due to $$)  -Recommend [phone] units of vitamin D daily. Recommend 1200 mg of calcium daily from dietary and supplemental sources. Recommend weight-bearing and muscle strengthening exercises for building and  maintaining bone density. -Patient is currently having some jaw pain.  She took one dose of Fosamax and her jaw pain started about 4 weeks later.  Advised her to see a dentist to see if she can get X-rays of the area her pain is.  This could also be contributing to her increase in BP.  Would recommend resolving jaw pain before discussing treatment options for osteoporosis.  Patient agreeable to this.   Patient Goals/Self-Care Activities Patient will:  - take medications as prescribed focus on medication adherence by pill box collaborate with provider on medication access solutions  Follow Up Plan: The care management team will reach out to the patient again over the next 180 days.              The patient verbalized understanding of instructions, educational materials, and care plan provided today and DECLINED offer to receive copy of patient instructions, educational materials, and care plan.  Telephone follow up appointment with pharmacy team member scheduled for: 4 months  Edythe Clarity, [place], PharmD, [place] Clinical Pharmacist Practitioner [place] [phone]

## 2022-07-09 ENCOUNTER — Other Ambulatory Visit: Payer: Self-pay

## 2022-07-09 ENCOUNTER — Telehealth: Payer: Self-pay

## 2022-07-09 DIAGNOSIS — R1013 Epigastric pain: Secondary | ICD-10-CM

## 2022-07-09 MED ORDER — PANTOPRAZOLE SODIUM 40 MG PO TBEC: 40.0000 mg | DELAYED_RELEASE_TABLET | Freq: Two times a day (BID) | ORAL | 0 refills | Status: DC

## 2022-07-09 NOTE — Telephone Encounter (Signed)
  Prescription Request  07/09/2022  Is this a "Controlled Substance" medicine? No  LOV: 06/01/2022   What is the name of the medication or equipment? pantoprazole (PROTONIX) 40 MG tablet [[id number]]  ENDED   Have you contacted your pharmacy to request a refill? Yes   Which pharmacy would you like this sent to?   [facility], [place]   Patient notified that their request is being sent to the clinical staff for review and that they should receive a response within 2 business days.   Please advise at [phone]

## 2022-07-14 NOTE — Progress Notes (Signed)
Patient ID: [name], female   DOB: 04-10-1935, 87 y.o.   MRN: [id number]    Advanced Heart Failure Clinic Note   PCP: [name], MD Cardiology: [name] HF Cardiology: Aundra Dubin  87 y.o. with history of poorly controlled HTN, chronic atrial fibrillation, chronic diastolic CHF, and prior episode of Takotsubo cardiomyopathy. Patient has had exertional dyspnea since 11/16.  At that time, she presented to the hospital with chest pain.  Coronary angiography showed no significant CAD.  EF was 35-40% by echo, suspected Takotsubo cardiomyopathy.  Her echo in 3/17 showed EF improved to 75-44% but PA systolic pressure elevated at 65 mmHg.    RHC/LHC in 4/22 showed nonobstructive CAD; mean RA 5, PA 44/10, mean PCWP 20, CI 4.89, PVR < 1 WU.  Echo in 8/22 showed EF up to 65-70%, mild LVH, normal RV, PASP 67, moderate RAE, trivial MR. PYP scan in 9/22 was probably negative.   Patient was admitted in 7/23 with chest pain, she had a mild elevation in troponin with no trend.  This was thought to be due to demand ischemia with volume overload rather than ACS.  Echo this admission showed EF 60-65%, mild LVH, moderate RV enlargement, normal RV systolic function, PASP 50 mmHg, mild mitral stenosis with mean gradient 4 mmHg, mild-moderate AS, IVC normal.   Last seen for f/u 08/23. Volume okay on exam. ReDS 35%. NYHA III symptoms. Thought to be a candidate for CardioMems.   Here today for 3 month follow-up.  Labs (11/16): LDL 80 Labs (4/17): K 4.5, creatinine 1.1, BNP 132 Labs (5/17): LDL 92 Labs (3/18): K 4, creatinine 0.98, LDL 81, proBNP 834 Labs (6/18): K 4.1, creatinine 1.37, hgb 12.5 Labs (10/21): LDL 79, HDL 71 Labs (5/22): myeloma panel and urine immunofixation negative Labs (7/22): K 4.4, creatinine 1.19 Labs (2/23): K 4.5, creatinine 1.37, hgb 9.7 Labs (3/23): LDL 52 Labs (7/23): K 4.3, creatinine 1.22, hgb 10.2, BNP 121   PMH: 1. Hyperlipidemia: Myalgias with atorvastatin, Crestor,  simvastatin. Myalgias with Zetia.  2. Chronic atrial fibrillation 3. HTN: Poor control.  4. H/o breast cancer: 2013.  5. GERD 6. Takotsubo cardiomyopathy: Admitted in 11/16 with chest pain.  Coronary angiography showed nonobstructive CAD.  Echo (11/16) showed EF 35-40%.  EF back to normal by 3/17 echo.  7. Chronic diastolic CHF - Echo (9/20) with EF 60-65%, mild aortic stenosis, PA systolic pressure 65 mmHg, RV mildly dilated with normal systolic function.  - Cardiolite (4/17) with EF 61%, small reversible mid anteroseptal/apical lateral defects thought to be related to shifting breast artifact => low risk study.  - Echo (12/21): EF 65-70%, normal RV, PASP 60 mmHg, mild AS, mild-moderate TR.   - RHC/LHC in 4/22 showed nonobstructive CAD; mean RA 5, PA 44/10, mean PCWP 20, CI 4.89, PVR < 1 WU.   - Echo in 8/22 showed EF up to 65-70%, mild LVH, normal RV, PASP 67, moderate RAE, trivial MR.  - PYP scan (9/22): H/CL < 1.5, grade 1.  Probably negative.   - Echo (7/23): EF 60-65%, mild LVH, moderate RV enlargement, normal RV systolic function, PASP 50 mmHg, mild mitral stenosis with mean gradient 4 mmHg, mild-moderate AS, IVC normal.  8. Ascending aortic aneurysm: 4.3 cm by CTA in 11/16.  9. Pulmonary hypertension: PASP 65 mmHg by echo in 3/17.  CTA chest in 11/16 with no PE.  PFTs in 7/15 with mild obstructive lung disease.  10. Sleep study negative 2017. 11. Bradycardia: off nodal blockers.  -  Holter (8/20): Average HR 51, afib 12. Aortic stenosis: Mild-moderate 7/23 echo. 13. Mitral stenosis: Mild 7/23 echo   Social History   Socioeconomic History   Marital status: Married    Spouse name: Not on file   Number of children: Not on file   Years of education: Not on file   Highest education level: Not on file  Occupational History   Not on file  Tobacco Use   Smoking status: Never   Smokeless tobacco: Never  Vaping Use   Vaping Use: Never used  Substance and Sexual Activity   Alcohol  use: No   Drug use: No   Sexual activity: Not Currently  Other Topics Concern   Not on file  Social History Narrative   Not on file   Social Determinants of Health   Financial Resource Strain: Low Risk  (07/02/2022)   Overall Financial Resource Strain (CARDIA)    Difficulty of Paying Living Expenses: Not very hard  Food Insecurity: No Food Insecurity (07/02/2022)   Hunger Vital Sign    Worried About Running Out of Food in the Last Year: Never true    Ran Out of Food in the Last Year: Never true  Transportation Needs: Not on file  Physical Activity: Not on file  Stress: Not on file  Social Connections: Not on file  Intimate Partner Violence: Not on file   Family History  Problem Relation Age of Onset   Heart disease Mother    Heart attack Mother    Cancer Sister        stomach and colon   Heart disease Brother 58   Hypertension Father    Cancer Sister    Stroke Neg Hx    ROS: All systems reviewed and negative except as per HPI.   Current Outpatient Medications  Medication Sig Dispense Refill   acetaminophen (TYLENOL) 500 MG tablet Take 1,000 mg by mouth every 6 (six) hours as needed for headache or moderate pain.     amLODipine (NORVASC) 5 MG tablet Take 1 tablet (5 mg total) by mouth daily. 90 tablet 3   apixaban (ELIQUIS) 5 MG TABS tablet Take 1 tablet (5 mg total) by mouth 2 (two) times daily. 60 tablet 6   Azelastine HCl 137 MCG/SPRAY SOLN Place 1 spray into both nostrils 2 (two) times daily as needed (congestion).     Cholecalciferol (VITAMIN D3) 25 MCG (1000 UT) CAPS Take 1,000 Units by mouth once a week.     clobetasol ointment (TEMOVATE) 1.61 % Apply 1 Application topically daily as needed (irritation).     clonazePAM (KLONOPIN) 0.5 MG tablet Take 1 tablet by mouth twice daily as needed for anxiety 60 tablet 0   cloNIDine (CATAPRES) 0.1 MG tablet Take 1 tablet (0.1 mg total) by mouth 2 (two) times daily. 180 tablet 2   famotidine (PEPCID) 20 MG tablet Take 20 mg  by mouth at bedtime as needed for heartburn.     ferrous sulfate 325 (65 FE) MG tablet Take 1 tablet (325 mg total) by mouth daily with breakfast. 90 tablet 3   fluticasone (FLONASE) 50 MCG/ACT nasal spray Place 1 spray into both nostrils daily as needed for allergies or rhinitis.     levocetirizine (XYZAL ALLERGY 24HR) 5 MG tablet Take 1 tablet (5 mg total) by mouth every evening. 30 tablet 0   losartan (COZAAR) 100 MG tablet Take 1 tablet by mouth once daily 90 tablet 1   nitroGLYCERIN (NITROSTAT) 0.4 MG SL tablet DISSOLVE  ONE TABLET UNDER THE TONGUE EVERY 5 MINUTES AS NEEDED FOR CHEST PAIN.  DO NOT EXCEED A TOTAL OF 3 DOSES IN 15 MINUTES 25 tablet 8   pantoprazole (PROTONIX) 40 MG tablet Take 1 tablet (40 mg total) by mouth 2 (two) times daily. 60 tablet 0   ranolazine (RANEXA) 500 MG 12 hr tablet Take 500 mg by mouth as needed.     spironolactone (ALDACTONE) 25 MG tablet Take 1 tablet by mouth once daily 90 tablet 1   torsemide (DEMADEX) 20 MG tablet Take 4 tablets (80 mg total) by mouth 2 (two) times daily. 180 tablet 11   No current facility-administered medications for this visit.   There were no vitals taken for this visit.   Wt Readings from Last 3 Encounters:  06/01/22 80.3 kg (177 lb)  04/16/22 79.8 kg (176 lb)  04/13/22 78.6 kg (173 lb 3.2 oz)    General: NAD Neck: JVP 8 cm with HJR, no thyromegaly or thyroid nodule.  Lungs: Clear to auscultation bilaterally with normal respiratory effort. CV: Nondisplaced PMI.  Heart irregular S1/S2, no S3/S4, 2/6 SEM RUSB.  Trace ankle edema.  No carotid bruit.  Normal pedal pulses.  Abdomen: Soft, nontender, no hepatosplenomegaly, no distention.  Skin: Intact without lesions or rashes.  Neurologic: Alert and oriented x 3.  Psych: Normal affect. Extremities: No clubbing or cyanosis.  HEENT: Normal.   Assessment/Plan: 1. Chronic atrial fibrillation: She is off nodal blockade with HR in 60s today.  - Continue Eliquis 5 mg bid.    2. HTN:  BP reasonably controlled.  Continue current regimen.  3. Chronic diastolic CHF: NYHA class III symptoms, slowly progressive over time.  Mild volume overload on exam.  RHC/LHC in 4/22 showed nonobstructive CAD; mean RA 5, PA 44/10, mean PCWP 20, CI 4.89, PVR < 1 WU (pulmonary venous hypertension).  Echo in 8/22 showed EF up to 65-70%, mild LVH, normal RV, PASP 67, moderate RAE, trivial MR. PYP scan in 9/22 was probably negative. Echo in 7/23 showed EF 60-65%, mild LVH, moderate RV enlargement, normal RV systolic function, PASP 50 mmHg, mild mitral stenosis with mean gradient 4 mmHg, mild-moderate AS, IVC normal.  - NYHA ***. Volume *** - Continue torsemide 80 mg bid. BMET/BNP today.   - Unable to tolerate Jardiance due to frequent UTIs.  - I think she would be a good candidate for Cardiomems for volume monitoring.  We discussed and she seems interested.  Will see if we can get insurance coverage.  4. CAD: Nonobstructive on 4/22 cath. She has been intolerant of statins and Zetia, LDL 52 in 3/23.   She has had atypical chest pain with recent negative workup.  - No ASA given apixaban use.   Followup****  [facility], Keelia Graybill N 07/14/2022

## 2022-07-15 ENCOUNTER — Inpatient Hospital Stay (HOSPITAL_COMMUNITY)
Admission: RE | Admit: 2022-07-15 | Discharge: 2022-07-15 | Disposition: A | Discharge status: Home / Self Care | Payer: Medicare Other | Source: Ambulatory Visit | Attending: Cardiology | Admitting: Cardiology

## 2022-07-15 ENCOUNTER — Encounter (HOSPITAL_COMMUNITY): Payer: Self-pay

## 2022-07-15 ENCOUNTER — Ambulatory Visit (HOSPITAL_COMMUNITY)
Admission: RE | Admit: 2022-07-15 | Discharge: 2022-07-15 | Disposition: A | Discharge status: Home / Self Care | Payer: Medicare Other | Source: Ambulatory Visit | Attending: Physician Assistant | Admitting: Physician Assistant

## 2022-07-15 VITALS — BP 126/54 | HR 53 | Wt 176.0 lb

## 2022-07-15 DIAGNOSIS — I5032 Chronic diastolic (congestive) heart failure: Secondary | ICD-10-CM | POA: Diagnosis not present

## 2022-07-15 DIAGNOSIS — I110 Hypertensive heart disease with heart failure: Secondary | ICD-10-CM | POA: Insufficient documentation

## 2022-07-15 DIAGNOSIS — I272 Pulmonary hypertension, unspecified: Secondary | ICD-10-CM | POA: Diagnosis not present

## 2022-07-15 DIAGNOSIS — R001 Bradycardia, unspecified: Secondary | ICD-10-CM | POA: Diagnosis not present

## 2022-07-15 DIAGNOSIS — I2510 Atherosclerotic heart disease of native coronary artery without angina pectoris: Secondary | ICD-10-CM

## 2022-07-15 DIAGNOSIS — Z7901 Long term (current) use of anticoagulants: Secondary | ICD-10-CM | POA: Diagnosis not present

## 2022-07-15 DIAGNOSIS — I482 Chronic atrial fibrillation, unspecified: Secondary | ICD-10-CM

## 2022-07-15 DIAGNOSIS — Z79899 Other long term (current) drug therapy: Secondary | ICD-10-CM | POA: Diagnosis not present

## 2022-07-15 DIAGNOSIS — Z87440 Personal history of urinary (tract) infections: Secondary | ICD-10-CM | POA: Insufficient documentation

## 2022-07-15 DIAGNOSIS — R5383 Other fatigue: Secondary | ICD-10-CM | POA: Insufficient documentation

## 2022-07-15 DIAGNOSIS — I10 Essential (primary) hypertension: Secondary | ICD-10-CM

## 2022-07-15 DIAGNOSIS — R0609 Other forms of dyspnea: Secondary | ICD-10-CM | POA: Diagnosis not present

## 2022-07-15 LAB — BASIC METABOLIC PANEL
Anion gap: 10 (ref 5–15)
BUN: 86 mg/dL — ABNORMAL HIGH (ref 8–23)
CO2: 27 mmol/L (ref 22–32)
Calcium: 9.2 mg/dL (ref 8.9–10.3)
Chloride: 99 mmol/L (ref 98–111)
Creatinine, Ser: 1.85 mg/dL — ABNORMAL HIGH (ref 0.44–1.00)
GFR, Estimated: 26 mL/min — ABNORMAL LOW (ref >60)
Glucose, Bld: 129 mg/dL — ABNORMAL HIGH (ref 70–99)
Potassium: 4.1 mmol/L (ref 3.5–5.1)
Sodium: 136 mmol/L (ref 135–145)

## 2022-07-15 LAB — CBC
HCT: 34.2 % — ABNORMAL LOW (ref 36.0–46.0)
Hemoglobin: 10.7 g/dL — ABNORMAL LOW (ref 12.0–15.0)
MCH: 30.3 pg (ref 26.0–34.0)
MCHC: 31.3 g/dL (ref 30.0–36.0)
MCV: 96.9 fL (ref 80.0–100.0)
Platelets: 233 10*3/uL (ref 150–400)
RBC: 3.53 MIL/uL — ABNORMAL LOW (ref 3.87–5.11)
RDW: 13.5 % (ref 11.5–15.5)
WBC: 5.6 10*3/uL (ref 4.0–10.5)
nRBC: 0 % (ref 0.0–0.2)

## 2022-07-15 LAB — BRAIN NATRIURETIC PEPTIDE: B Natriuretic Peptide: 75.7 pg/mL (ref 0.0–100.0)

## 2022-07-15 MED ORDER — CLONIDINE HCL 0.1 MG PO TABS: 0.1000 mg | ORAL_TABLET | Freq: Every day | ORAL | 3 refills | Status: DC

## 2022-07-15 NOTE — Patient Instructions (Addendum)
Thank you for coming in today  Labs were done today, if any labs are abnormal the clinic will call you No news is good news  EKG today   Your physician recommends that you schedule a follow-up appointment in:  4 weeks in clinic  DECREASE Clonidine to 0.1 mg 1 tablet daily   Your provider has recommended that  you wear a Zio Patch for 7 days.  This monitor will record your heart rhythm for our review.  IF you have any symptoms while wearing the monitor please press the button.  If you have any issues with the patch or you notice a red or orange light on it please call the company at [phone].  Once you remove the patch please mail it back to the company as soon as possible so we can get the results.     Do the following things EVERYDAY: Weigh yourself in the morning before breakfast. Write it down and keep it in a log. Take your medicines as prescribed Eat low salt foods--Limit salt (sodium) to 2000 mg per day.  Stay as active as you can everyday Limit all fluids for the day to less than 2 liters  At the [facility], you and your health needs are our priority. As part of our continuing mission to provide you with exceptional heart care, we have created designated Provider Care Teams. These Care Teams include your primary Cardiologist (physician) and Advanced Practice Providers (APPs- Physician Assistants and Nurse Practitioners) who all work together to provide you with the care you need, when you need it.   You Mcleary see any of the following providers on your designated Care Team at your next follow up: Dr Glori Bickers Dr Loralie Champagne Dr. Roxana Hires, NP [name], [place] [facility] [place], [place] Forestine Na, NP Audry Riles, PharmD   Please be sure to bring in all your medications bottles to every appointment.   If you have any questions or concerns before your next appointment please send [place] a message through [place] or  call our office at [phone].    TO LEAVE A MESSAGE FOR THE NURSE SELECT OPTION 2, PLEASE LEAVE A MESSAGE INCLUDING: YOUR NAME DATE OF BIRTH CALL BACK NUMBER REASON FOR CALL**this is important as we prioritize the call backs  YOU WILL RECEIVE A CALL BACK THE SAME DAY AS LONG AS YOU CALL BEFORE 4:00 PM

## 2022-07-15 NOTE — Progress Notes (Signed)
ReDS Vest / Clip - 07/15/22 1215       ReDS Vest / Clip   Station Marker A    Ruler Value 32    ReDS Value Range Low volume    ReDS Actual Value 33    Anatomical Comments sitting

## 2022-07-16 ENCOUNTER — Telehealth (HOSPITAL_COMMUNITY): Payer: Self-pay

## 2022-07-16 NOTE — Telephone Encounter (Signed)
Patient aware and agreeable. 

## 2022-07-26 ENCOUNTER — Other Ambulatory Visit: Payer: Self-pay

## 2022-07-26 ENCOUNTER — Encounter: Payer: Self-pay | Admitting: Family Medicine

## 2022-07-26 ENCOUNTER — Ambulatory Visit (INDEPENDENT_AMBULATORY_CARE_PROVIDER_SITE_OTHER): Payer: Medicare Other | Admitting: Family Medicine

## 2022-07-26 VITALS — BP 132/46 | HR 68 | Temp 98.5°F | Ht 63.0 in | Wt 179.0 lb

## 2022-07-26 DIAGNOSIS — B3731 Acute candidiasis of vulva and vagina: Secondary | ICD-10-CM | POA: Diagnosis not present

## 2022-07-26 DIAGNOSIS — N898 Other specified noninflammatory disorders of vagina: Secondary | ICD-10-CM | POA: Diagnosis not present

## 2022-07-26 DIAGNOSIS — Z23 Encounter for immunization: Secondary | ICD-10-CM

## 2022-07-26 LAB — URINALYSIS, ROUTINE W REFLEX MICROSCOPIC
Bacteria, UA: NONE SEEN /HPF
Bilirubin Urine: NEGATIVE
Glucose, UA: NEGATIVE
Hgb urine dipstick: NEGATIVE
Hyaline Cast: NONE SEEN /LPF
Ketones, ur: NEGATIVE
Nitrite: NEGATIVE
Protein, ur: NEGATIVE
RBC / HPF: NONE SEEN /HPF (ref 0–2)
Specific Gravity, Urine: 1.01 (ref 1.001–1.035)
pH: 5.5 (ref 5.0–8.0)

## 2022-07-26 LAB — WET PREP FOR TRICH, YEAST, CLUE

## 2022-07-26 LAB — MICROSCOPIC MESSAGE

## 2022-07-26 MED ORDER — FLUCONAZOLE 150 MG PO TABS: 150.0000 mg | ORAL_TABLET | Freq: Once | ORAL | 0 refills | Status: AC

## 2022-07-26 NOTE — Progress Notes (Signed)
   Acute Office Visit  Subjective:     Patient ID: [name], female    DOB: 1935/02/02, 87 y.o.   MRN: [id number]  Chief Complaint  Patient presents with   Follow-up    UTI/bad odor    HPI Patient is in today for "wet feeling coming down" and moisture between her legs. She also reports mild back and abdominal discomfort for a few weeks. She denies discharge, dysuria, incontinence, fever, chills, night sweats, frequency, urgency, or saddle numbness. She reports itching, unsure about redness or swelling.  Review of Systems  Reason unable to perform ROS: see HPI.  Constitutional: Negative.   All other systems reviewed and are negative.       Objective:    BP (!) 132/46   Pulse 68   Temp 98.5 F (36.9 C) (Oral)   Ht '5\' 3"'$  (1.6 m)   Wt 179 lb (81.2 kg)   SpO2 95%   BMI 31.71 kg/m    Physical Exam Vitals and nursing note reviewed. Exam conducted with a chaperone present.  Constitutional:      Appearance: Normal appearance.  HENT:     Head: Normocephalic and atraumatic.  Abdominal:     General: Bowel sounds are normal.     Palpations: Abdomen is soft.  Genitourinary:    Comments: Patient remained in standing position due to mobility limitations, labia and vulva did appear reddened, no edema or discharge observed, wet prep obtained Skin:    General: Skin is warm and dry.  Neurological:     General: No focal deficit present.     Mental Status: She is alert and oriented to person, place, and time. Mental status is at baseline.  Psychiatric:        Mood and Affect: Mood normal.        Behavior: Behavior normal.        Thought Content: Thought content normal.        Judgment: Judgment normal.     No results found for any visits on 07/26/22.      Assessment & Plan:   1. Vulvovaginal candidiasis Presence of yeast on wet prep. UA negative. Will treat with Diflucan '150mg'$  one tablet. Patient instructed to return to office if symptoms persist or worsen. -  fluconazole (DIFLUCAN) 150 MG tablet; Take 1 tablet (150 mg total) by mouth once for 1 dose.  Dispense: 1 tablet; Refill: 0  2. Vaginal itching - WET PREP FOR TRICH, YEAST, CLUE - Urinalysis, Routine w reflex microscopic    Return if symptoms worsen or fail to improve.  Rubie Maid, FNP

## 2022-08-04 ENCOUNTER — Telehealth (HOSPITAL_COMMUNITY): Payer: Self-pay

## 2022-08-04 DIAGNOSIS — R001 Bradycardia, unspecified: Secondary | ICD-10-CM | POA: Diagnosis not present

## 2022-08-04 NOTE — Addendum Note (Signed)
Encounter addended by: Micki Riley, RN on: 08/04/2022 1:51 PM  Actions taken: Imaging Exam ended

## 2022-08-04 NOTE — Telephone Encounter (Signed)
Zio called to report that the patients zio showed slow a-fib HR of 38bpm for 60 sec on 07/21/22 at 11:26pm. She states the full report that has been posted.

## 2022-08-06 NOTE — Progress Notes (Signed)
Patient ID: [name], female   DOB: Feb 19, 1935, 87 y.o.   MRN: [id number]    Advanced Heart Failure Clinic Note   PCP: Donita Brooks, MD Cardiology: Dr. Mayford Knife HF Cardiology: Dr. Shirlee Latch  87 y.o. with history of poorly controlled HTN, chronic atrial fibrillation, chronic diastolic CHF, and prior episode of Takotsubo cardiomyopathy. Patient has had exertional dyspnea since 11/16.  At that time, she presented to the hospital with chest pain.  Coronary angiography showed no significant CAD.  EF was 35-40% by echo, suspected Takotsubo cardiomyopathy.  Her echo in 3/17 showed EF improved to 60-65% but PA systolic pressure elevated at 65 mmHg.    RHC/LHC in 4/22 showed nonobstructive CAD; mean RA 5, PA 44/10, mean PCWP 20, CI 4.89, PVR < 1 WU.  Echo in 8/22 showed EF up to 65-70%, mild LVH, normal RV, PASP 67, moderate RAE, trivial MR. PYP scan in 9/22 was probably negative.   Patient was admitted in 7/23 with chest pain, she had a mild elevation in troponin with no trend.  This was thought to be due to demand ischemia with volume overload rather than ACS.  Echo this admission showed EF 60-65%, mild LVH, moderate RV enlargement, normal RV systolic function, PASP 50 mmHg, mild mitral stenosis with mean gradient 4 mmHg, mild-moderate AS, IVC normal.   7 day Zio 11/23 showed atrial fibrillation, average HR 54 bpm with 2 3-second pauses.  Today she returns for HF follow up. Overall feeling fine. She is short of breath walking further distances on flat ground. She feels more SOB walking back to clinic today. She forgets to take her diuretics 1-2x/week. She some LE swelling. Denies palpitations, CP, dizziness,or PND/Orthopnea. Appetite ok. No fever or chills. Weight at home 171-176 pounds. Taking all medications. Eating more recently.  ReDs: 36%  Labs (11/23): K 4.1, creatinine 1.85, hgb 10.7  PMH: 1. Hyperlipidemia: Myalgias with atorvastatin, Crestor, simvastatin. Myalgias with Zetia.  2. Chronic  atrial fibrillation 3. HTN: Poor control.  4. H/o breast cancer: 2013.  5. GERD 6. Takotsubo cardiomyopathy: Admitted in 11/16 with chest pain.  Coronary angiography showed nonobstructive CAD.  Echo (11/16) showed EF 35-40%.  EF back to normal by 3/17 echo.  7. Chronic diastolic CHF - Echo (3/17) with EF 60-65%, mild aortic stenosis, PA systolic pressure 65 mmHg, RV mildly dilated with normal systolic function.  - Cardiolite (4/17) with EF 61%, small reversible mid anteroseptal/apical lateral defects thought to be related to shifting breast artifact => low risk study.  - Echo (12/21): EF 65-70%, normal RV, PASP 60 mmHg, mild AS, mild-moderate TR.   - RHC/LHC in 4/22 showed nonobstructive CAD; mean RA 5, PA 44/10, mean PCWP 20, CI 4.89, PVR < 1 WU.   - Echo in 8/22 showed EF up to 65-70%, mild LVH, normal RV, PASP 67, moderate RAE, trivial MR.  - PYP scan (9/22): H/CL < 1.5, grade 1.  Probably negative.   - Echo (7/23): EF 60-65%, mild LVH, moderate RV enlargement, normal RV systolic function, PASP 50 mmHg, mild mitral stenosis with mean gradient 4 mmHg, mild-moderate AS, IVC normal.  8. Ascending aortic aneurysm: 4.3 cm by CTA in 11/16.  9. Pulmonary hypertension: PASP 65 mmHg by echo in 3/17.  CTA chest in 11/16 with no PE.  PFTs in 7/15 with mild obstructive lung disease.  10. Sleep study negative 2017. 11. Bradycardia: off nodal blockers.  - Holter (8/20): Average HR 51, afib - 7 day Zio (11/23): AF average  HR 54 bpm, 2 3-second pauses. 12. Aortic stenosis: Mild-moderate 7/23 echo. 13. Mitral stenosis: Mild 7/23 echo   Social History   Socioeconomic History   Marital status: Married    Spouse name: Not on file   Number of children: Not on file   Years of education: Not on file   Highest education level: Not on file  Occupational History   Not on file  Tobacco Use   Smoking status: Never   Smokeless tobacco: Never  Vaping Use   Vaping Use: Never used  Substance and Sexual  Activity   Alcohol use: No   Drug use: No   Sexual activity: Not Currently  Other Topics Concern   Not on file  Social History Narrative   Not on file   Social Determinants of Health   Financial Resource Strain: Low Risk  (07/02/2022)   Overall Financial Resource Strain (CARDIA)    Difficulty of Paying Living Expenses: Not very hard  Food Insecurity: No Food Insecurity (07/02/2022)   Hunger Vital Sign    Worried About Running Out of Food in the Last Year: Never true    Ran Out of Food in the Last Year: Never true  Transportation Needs: Not on file  Physical Activity: Not on file  Stress: Not on file  Social Connections: Not on file  Intimate Partner Violence: Not on file   Family History  Problem Relation Age of Onset   Heart disease Mother    Heart attack Mother    Cancer Sister        stomach and colon   Heart disease Brother 24   Hypertension Father    Cancer Sister    Stroke Neg Hx    ROS: All systems reviewed and negative except as per HPI.   Current Outpatient Medications  Medication Sig Dispense Refill   acetaminophen (TYLENOL) 500 MG tablet Take 1,000 mg by mouth every 6 (six) hours as needed for headache or moderate pain.     amLODipine (NORVASC) 5 MG tablet Take 1 tablet (5 mg total) by mouth daily. 90 tablet 3   apixaban (ELIQUIS) 5 MG TABS tablet Take 1 tablet (5 mg total) by mouth 2 (two) times daily. 60 tablet 6   Azelastine HCl 137 MCG/SPRAY SOLN Place 1 spray into both nostrils 2 (two) times daily as needed (congestion).     Cholecalciferol (VITAMIN D3) 25 MCG (1000 UT) CAPS Take 1,000 Units by mouth once a week.     clobetasol ointment (TEMOVATE) 0.05 % Apply 1 Application topically daily as needed (irritation).     clonazePAM (KLONOPIN) 0.5 MG tablet Take 1 tablet by mouth twice daily as needed for anxiety 60 tablet 0   cloNIDine (CATAPRES) 0.1 MG tablet Take 1 tablet (0.1 mg total) by mouth daily. 90 tablet 3   famotidine (PEPCID) 20 MG tablet Take 20  mg by mouth at bedtime as needed for heartburn.     ferrous sulfate 325 (65 FE) MG tablet Take 1 tablet (325 mg total) by mouth daily with breakfast. 90 tablet 3   fluticasone (FLONASE) 50 MCG/ACT nasal spray Place 1 spray into both nostrils daily as needed for allergies or rhinitis.     levocetirizine (XYZAL ALLERGY 24HR) 5 MG tablet Take 1 tablet (5 mg total) by mouth every evening. 30 tablet 0   losartan (COZAAR) 100 MG tablet Take 1 tablet by mouth once daily 90 tablet 1   nitroGLYCERIN (NITROSTAT) 0.4 MG SL tablet DISSOLVE ONE TABLET UNDER  THE TONGUE EVERY 5 MINUTES AS NEEDED FOR CHEST PAIN.  DO NOT EXCEED A TOTAL OF 3 DOSES IN 15 MINUTES 25 tablet 8   spironolactone (ALDACTONE) 25 MG tablet Take 1 tablet by mouth once daily 90 tablet 1   ranolazine (RANEXA) 500 MG 12 hr tablet Take 500 mg by mouth as needed.     No current facility-administered medications for this encounter.   BP (!) 140/70   Pulse 60   Wt 83.3 kg (183 lb 9.6 oz)   SpO2 95%   BMI 32.52 kg/m    Wt Readings from Last 3 Encounters:  08/09/22 83.3 kg (183 lb 9.6 oz)  07/26/22 81.2 kg (179 lb)  07/15/22 79.8 kg (176 lb)   Physical Exam  General:  NAD. No resp difficulty, walked into clinic with cane HEENT: Normal Neck: Supple. JVP 7-8. Carotids 2+ bilat; no bruits. No lymphadenopathy or thryomegaly appreciated. Cor: PMI nondisplaced. Irregular rate & rhythm. No rubs, gallops or murmurs. Lungs: Clear Abdomen: Soft, nontender, nondistended. No hepatosplenomegaly. No bruits or masses. Good bowel sounds. Extremities: No cyanosis, clubbing, rash, trace BLE pedal edema Neuro: Alert & oriented x 3, cranial nerves grossly intact. Moves all 4 extremities w/o difficulty. Affect pleasant.  Assessment/Plan: 1. Chronic atrial fibrillation/bradycardia: She is off nodal blockade. - Continue Eliquis 5 mg bid.    - 7 day Zio (11/23) showed AF with average HR 54 bpm, with 2 3-sec pauses. Will follow. 2. HTN: BP controlled.   3.  Chronic diastolic CHF: NYHA class III symptoms, slowly progressive over time.  Mild volume overload on exam.  RHC/LHC in 4/22 showed nonobstructive CAD; mean RA 5, PA 44/10, mean PCWP 20, CI 4.89, PVR < 1 WU (pulmonary venous hypertension).  Echo in 8/22 showed EF up to 65-70%, mild LVH, normal RV, PASP 67, moderate RAE, trivial MR. PYP scan in 9/22 was probably negative. Echo in 7/23 showed EF 60-65%, mild LVH, moderate RV enlargement, normal RV systolic function, PASP 50 mmHg, mild mitral stenosis with mean gradient 4 mmHg, mild-moderate AS, IVC normal. NYHA III, she is mildly volume overloaded today, ReDs 36%..  - Increase torsemide to 80 mg bid. Add 20 KCL daily (will use 10 mEq tablets as she has trouble swallowing the 20 mEqs). BMET/BNP today, repeat BMET in 10-14 days. - Continue losartan 100 mg daily.  - Continue spiro 25 mg daily. - Unable to tolerate Jardiance due to frequent UTIs.  - Cardiomems discussed at last visit and she was agreeable. Awaiting insurance decision. 4. CAD: Nonobstructive on 4/22 cath. She has been intolerant of statins and Zetia.   She has had atypical chest pain with recent negative workup.  - No ASA given apixaban use.   Follow up in 3 months with Dr. Park Liter, FNP-BC 08/09/2022

## 2022-08-09 ENCOUNTER — Encounter (HOSPITAL_COMMUNITY): Payer: Self-pay

## 2022-08-09 ENCOUNTER — Telehealth (HOSPITAL_COMMUNITY): Payer: Self-pay

## 2022-08-09 ENCOUNTER — Ambulatory Visit (HOSPITAL_COMMUNITY)
Admission: RE | Admit: 2022-08-09 | Discharge: 2022-08-09 | Disposition: A | Discharge status: Home / Self Care | Payer: Medicare Other | Source: Ambulatory Visit | Attending: Family Medicine | Admitting: Family Medicine

## 2022-08-09 VITALS — BP 140/70 | HR 60 | Wt 183.6 lb

## 2022-08-09 DIAGNOSIS — R0602 Shortness of breath: Secondary | ICD-10-CM | POA: Diagnosis not present

## 2022-08-09 DIAGNOSIS — Z79899 Other long term (current) drug therapy: Secondary | ICD-10-CM | POA: Insufficient documentation

## 2022-08-09 DIAGNOSIS — R0609 Other forms of dyspnea: Secondary | ICD-10-CM | POA: Insufficient documentation

## 2022-08-09 DIAGNOSIS — R001 Bradycardia, unspecified: Secondary | ICD-10-CM | POA: Insufficient documentation

## 2022-08-09 DIAGNOSIS — I110 Hypertensive heart disease with heart failure: Secondary | ICD-10-CM | POA: Insufficient documentation

## 2022-08-09 DIAGNOSIS — I2510 Atherosclerotic heart disease of native coronary artery without angina pectoris: Secondary | ICD-10-CM

## 2022-08-09 DIAGNOSIS — I5032 Chronic diastolic (congestive) heart failure: Secondary | ICD-10-CM | POA: Diagnosis not present

## 2022-08-09 DIAGNOSIS — I350 Nonrheumatic aortic (valve) stenosis: Secondary | ICD-10-CM | POA: Insufficient documentation

## 2022-08-09 DIAGNOSIS — Z87440 Personal history of urinary (tract) infections: Secondary | ICD-10-CM | POA: Insufficient documentation

## 2022-08-09 DIAGNOSIS — I482 Chronic atrial fibrillation, unspecified: Secondary | ICD-10-CM | POA: Diagnosis not present

## 2022-08-09 DIAGNOSIS — I10 Essential (primary) hypertension: Secondary | ICD-10-CM

## 2022-08-09 DIAGNOSIS — I050 Rheumatic mitral stenosis: Secondary | ICD-10-CM | POA: Insufficient documentation

## 2022-08-09 DIAGNOSIS — Z7901 Long term (current) use of anticoagulants: Secondary | ICD-10-CM | POA: Insufficient documentation

## 2022-08-09 LAB — BASIC METABOLIC PANEL
Anion gap: 9 (ref 5–15)
BUN: 63 mg/dL — ABNORMAL HIGH (ref 8–23)
CO2: 25 mmol/L (ref 22–32)
Calcium: 8.7 mg/dL — ABNORMAL LOW (ref 8.9–10.3)
Chloride: 98 mmol/L (ref 98–111)
Creatinine, Ser: 1.57 mg/dL — ABNORMAL HIGH (ref 0.44–1.00)
GFR, Estimated: 32 mL/min — ABNORMAL LOW (ref >60)
Glucose, Bld: 113 mg/dL — ABNORMAL HIGH (ref 70–99)
Potassium: 4 mmol/L (ref 3.5–5.1)
Sodium: 132 mmol/L — ABNORMAL LOW (ref 135–145)

## 2022-08-09 LAB — BRAIN NATRIURETIC PEPTIDE: B Natriuretic Peptide: 83.7 pg/mL (ref 0.0–100.0)

## 2022-08-09 MED ORDER — POTASSIUM CHLORIDE CRYS ER 10 MEQ PO TBCR: 20.0000 meq | EXTENDED_RELEASE_TABLET | Freq: Every day | ORAL | 6 refills | Status: DC

## 2022-08-09 MED ORDER — TORSEMIDE 20 MG PO TABS: 80.0000 mg | ORAL_TABLET | Freq: Two times a day (BID) | ORAL | 6 refills | Status: DC

## 2022-08-09 NOTE — Patient Instructions (Addendum)
Great to see you!  Please increase Torsemide to 80 mg twice daily. Begin taking Potassium to 20 meq daily. Follow-up with Dr. Aundra Dubin as scheduled.  March 6th at 2pm You received samples of Eliquis today.   At the [facility], you and your health needs are our priority. We have a designated team specialized in the treatment of Heart Failure. This Care Team includes your primary Heart Failure Specialized Cardiologist (physician), Advanced Practice Providers (APPs- Physician Assistants and Nurse Practitioners), and Pharmacist who all work together to provide you with the care you need, when you need it.   You [place] see any of the following providers on your designated Care Team at your next follow up:  Dr. Glori Bickers Dr. Loralie Champagne Dr. Roxana Hires, NP [name], [place] [facility] [place], [place] Forestine Na, NP Audry Riles, PharmD   Please be sure to bring in all your medications bottles to every appointment.   Need to Contact us:  If you have any questions or concerns before your next appointment please send [place] a message through [place] or call our office at [phone].    TO LEAVE A MESSAGE FOR THE NURSE SELECT OPTION 2, PLEASE LEAVE A MESSAGE INCLUDING: YOUR NAME DATE OF BIRTH CALL BACK NUMBER REASON FOR CALL**this is important as we prioritize the call backs  YOU WILL RECEIVE A CALL BACK THE SAME DAY AS LONG AS YOU CALL BEFORE 4:00 PM

## 2022-08-09 NOTE — Progress Notes (Signed)
Medication Samples have been provided to the patient.  Drug name: Eliquis       Strength: '5mg'$         Qty: 4  LOT: [id number]   Exp.Date: 5/25  Dosing instructions: Take 1 tablet twice daily  The patient has been instructed regarding the correct time, dose, and frequency of taking this medication, including desired effects and most common side effects.   [name] 12:26 PM 08/09/2022

## 2022-08-09 NOTE — Addendum Note (Signed)
Encounter addended by: Micki Riley, RN on: 08/09/2022 12:37 PM  Actions taken: Order list changed, Diagnosis association updated

## 2022-08-09 NOTE — Telephone Encounter (Signed)
Patient aware of results and agreeable.

## 2022-08-09 NOTE — Progress Notes (Signed)
ReDS Vest / Clip - 08/09/22 1200       ReDS Vest / Clip   Station Marker A    Ruler Value 28    ReDS Value Range Moderate volume overload    ReDS Actual Value 36

## 2022-08-11 ENCOUNTER — Ambulatory Visit (INDEPENDENT_AMBULATORY_CARE_PROVIDER_SITE_OTHER): Payer: Medicare Other

## 2022-08-11 VITALS — BP 138/72 | HR 55 | Ht 63.0 in | Wt 181.6 lb

## 2022-08-11 DIAGNOSIS — Z0000 Encounter for general adult medical examination without abnormal findings: Secondary | ICD-10-CM | POA: Diagnosis not present

## 2022-08-11 NOTE — Patient Instructions (Signed)
[name] , Thank you for taking time to come for your Medicare Wellness Visit. I appreciate your ongoing commitment to your health goals. Please review the following plan we discussed and let me know if I can assist you in the future.   These are the goals we discussed:  Goals      Exercise 3x per week (30 min per time)     Encouraged pt to do chair exercise as tolerated.      Track and Manage My Blood Pressure-Hypertension     Timeframe:  Long-Range Goal Priority:  High Start Date: 06/18/21                            Expected End Date: 12/17/20                      Follow Up Date 10/19/20    - check blood pressure 3 times per week - choose a place to take my blood pressure (home, clinic or office, retail store) - write blood pressure results in a log or diary    Why is this important?   You won't feel high blood pressure, but it can still hurt your blood vessels.  High blood pressure can cause heart or kidney problems. It can also cause a stroke.  Making lifestyle changes like losing a little weight or eating less salt will help.  Checking your blood pressure at home and at different times of the day can help to control blood pressure.  If the doctor prescribes medicine remember to take it the way the doctor ordered.  Call the office if you cannot afford the medicine or if there are questions about it.     Notes:         This is a list of the screening recommended for you and due dates:  Health Maintenance  Topic Date Due   Zoster (Shingles) Vaccine (1 of 2) Never done   COVID-19 Vaccine (4 - 2023-24 season) 05/07/2022   Medicare Annual Wellness Visit  08/12/2023   DTaP/Tdap/Td vaccine (3 - Td or Tdap) 05/15/2030   Pneumonia Vaccine  Completed   Flu Shot  Completed   DEXA scan (bone density measurement)  Completed   HPV Vaccine  Aged Out    Advanced directives: Advance directive discussed with you today. Even though you declined this today, please call our office should  you change your mind, and we can give you the proper paperwork for you to fill out.   Conditions/risks identified: Aim for 30 minutes of exercise or brisk walking, 6-8 glasses of water, and 5 servings of fruits and vegetables each day.   Next appointment: Follow up in one year for your annual wellness visit 08/2023   Preventive Care 65 Years and Older, Female Preventive care refers to lifestyle choices and visits with your health care provider that can promote health and wellness. What does preventive care include? A yearly physical exam. This is also called an annual well check. Dental exams once or twice a year. Routine eye exams. Ask your health care provider how often you should have your eyes checked. Personal lifestyle choices, including: Daily care of your teeth and gums. Regular physical activity. Eating a healthy diet. Avoiding tobacco and drug use. Limiting alcohol use. Practicing safe sex. Taking low-dose aspirin every day. Taking vitamin and mineral supplements as recommended by your health care provider. What happens during an annual well  check? The services and screenings done by your health care provider during your annual well check will depend on your age, overall health, lifestyle risk factors, and family history of disease. Counseling  Your health care provider Raspberry ask you questions about your: Alcohol use. Tobacco use. Drug use. Emotional well-being. Home and relationship well-being. Sexual activity. Eating habits. History of falls. Memory and ability to understand (cognition). Work and work Statistician. Reproductive health. Screening  You Person have the following tests or measurements: Height, weight, and BMI. Blood pressure. Lipid and cholesterol levels. These Speckman be checked every 5 years, or more frequently if you are over 69 years old. Skin check. Lung cancer screening. You [place] have this screening every year starting at age 55 if you have a  30-pack-year history of smoking and currently smoke or have quit within the past 15 years. Fecal occult blood test (FOBT) of the stool. You Fitzpatrick have this test every year starting at age 61. Flexible sigmoidoscopy or colonoscopy. You Martorana have a sigmoidoscopy every 5 years or a colonoscopy every 10 years starting at age 30. Hepatitis C blood test. Hepatitis B blood test. Sexually transmitted disease (STD) testing. Diabetes screening. This is done by checking your blood sugar (glucose) after you have not eaten for a while (fasting). You [place] have this done every 1-3 years. Bone density scan. This is done to screen for osteoporosis. You Oshana have this done starting at age 48. Mammogram. This Stakes be done every 1-2 years. Talk to your health care provider about how often you should have regular mammograms. Talk with your health care provider about your test results, treatment options, and if necessary, the need for more tests. Vaccines  Your health care provider Potier recommend certain vaccines, such as: Influenza vaccine. This is recommended every year. Tetanus, diphtheria, and acellular pertussis (Tdap, Td) vaccine. You Marcil need a Td booster every 10 years. Zoster vaccine. You Savary need this after age 75. Pneumococcal 13-valent conjugate (PCV13) vaccine. One dose is recommended after age 3. Pneumococcal polysaccharide (PPSV23) vaccine. One dose is recommended after age 21. Talk to your health care provider about which screenings and vaccines you need and how often you need them. This information is not intended to replace advice given to you by your health care provider. Make sure you discuss any questions you have with your health care provider. Document Released: 09/19/2015 Document Revised: 05/12/2016 Document Reviewed: 06/24/2015 Elsevier Interactive Patient Education  2017 [place] Prevention in the Home Falls can cause injuries. They can happen to people of all ages. There are many  things you can do to make your home safe and to help prevent falls. What can I do on the outside of my home? Regularly fix the edges of walkways and driveways and fix any cracks. Remove anything that might make you trip as you walk through a door, such as a raised step or threshold. Trim any bushes or trees on the path to your home. Use bright outdoor lighting. Clear any walking paths of anything that might make someone trip, such as rocks or tools. Regularly check to see if handrails are loose or broken. Make sure that both sides of any steps have handrails. Any raised decks and porches should have guardrails on the edges. Have any leaves, snow, or ice cleared regularly. Use sand or salt on walking paths during winter. Clean up any spills in your garage right away. This includes oil or grease spills. What can I do in  the bathroom? Use night lights. Install grab bars by the toilet and in the tub and shower. Do not use towel bars as grab bars. Use non-skid mats or decals in the tub or shower. If you need to sit down in the shower, use a plastic, non-slip stool. Keep the floor dry. Clean up any water that spills on the floor as soon as it happens. Remove soap buildup in the tub or shower regularly. Attach bath mats securely with double-sided non-slip rug tape. Do not have throw rugs and other things on the floor that can make you trip. What can I do in the bedroom? Use night lights. Make sure that you have a light by your bed that is easy to reach. Do not use any sheets or blankets that are too big for your bed. They should not hang down onto the floor. Have a firm chair that has side arms. You can use this for support while you get dressed. Do not have throw rugs and other things on the floor that can make you trip. What can I do in the kitchen? Clean up any spills right away. Avoid walking on wet floors. Keep items that you use a lot in easy-to-reach places. If you need to reach  something above you, use a strong step stool that has a grab bar. Keep electrical cords out of the way. Do not use floor polish or wax that makes floors slippery. If you must use wax, use non-skid floor wax. Do not have throw rugs and other things on the floor that can make you trip. What can I do with my stairs? Do not leave any items on the stairs. Make sure that there are handrails on both sides of the stairs and use them. Fix handrails that are broken or loose. Make sure that handrails are as long as the stairways. Check any carpeting to make sure that it is firmly attached to the stairs. Fix any carpet that is loose or worn. Avoid having throw rugs at the top or bottom of the stairs. If you do have throw rugs, attach them to the floor with carpet tape. Make sure that you have a light switch at the top of the stairs and the bottom of the stairs. If you do not have them, ask someone to add them for you. What else can I do to help prevent falls? Wear shoes that: Do not have high heels. Have rubber bottoms. Are comfortable and fit you well. Are closed at the toe. Do not wear sandals. If you use a stepladder: Make sure that it is fully opened. Do not climb a closed stepladder. Make sure that both sides of the stepladder are locked into place. Ask someone to hold it for you, if possible. Clearly mark and make sure that you can see: Any grab bars or handrails. First and last steps. Where the edge of each step is. Use tools that help you move around (mobility aids) if they are needed. These include: Canes. Walkers. Scooters. Crutches. Turn on the lights when you go into a dark area. Replace any light bulbs as soon as they burn out. Set up your furniture so you have a clear path. Avoid moving your furniture around. If any of your floors are uneven, fix them. If there are any pets around you, be aware of where they are. Review your medicines with your doctor. Some medicines can make you  feel dizzy. This can increase your chance of falling. Ask your doctor  what other things that you can do to help prevent falls. This information is not intended to replace advice given to you by your health care provider. Make sure you discuss any questions you have with your health care provider. Document Released: 06/19/2009 Document Revised: 01/29/2016 Document Reviewed: 09/27/2014 Elsevier Interactive Patient Education  2017 [employer].

## 2022-08-11 NOTE — Progress Notes (Signed)
Subjective:   [name] is a 87 y.o. female who presents for Medicare Annual (Subsequent) preventive examination.  Review of Systems    Cardiac Risk Factors include: advanced age (>40mn, >>53women);dyslipidemia;hypertension;sedentary lifestyle;obesity (BMI >30kg/m2)     Objective:    Today's Vitals   08/11/22 0922 08/11/22 0930  BP: 138/72   Pulse: (!) 55   SpO2: 95%   Weight: 181 lb 9.6 oz (82.4 kg)   Height: '5\' 3"'$  (1.6 m)   PainSc:  5    Body mass index is 32.17 kg/m.     08/11/2022    9:39 AM 03/15/2022   12:13 PM 07/14/2021    9:35 AM 12/10/2020    9:39 AM 07/03/2020    9:44 AM 05/21/2020   12:38 PM 07/24/2019    8:12 AM  Advanced Directives  Does Patient Have a Medical Advance Directive? No  Yes No No No No  Type of AScientist, physiologicalof [place]Living will      Does patient want to make changes to medical advance directive?   No - Patient declined      Copy of [place] Chart?   No - copy requested      Would patient like information on creating a medical advance directive? No - Patient declined   No - Patient declined Yes (Inpatient - patient defers creating a medical advance directive at this time - Information given) No - Patient declined No - Patient declined     Information is confidential and restricted. Go to Review Flowsheets to unlock data.    Current Medications (verified) Outpatient Encounter Medications as of 08/11/2022  Medication Sig   acetaminophen (TYLENOL) 500 MG tablet Take 1,000 mg by mouth every 6 (six) hours as needed for headache or moderate pain.   amLODipine (NORVASC) 5 MG tablet Take 1 tablet (5 mg total) by mouth daily.   apixaban (ELIQUIS) 5 MG TABS tablet Take 1 tablet (5 mg total) by mouth 2 (two) times daily.   Azelastine HCl 137 MCG/SPRAY SOLN Place 1 spray into both nostrils 2 (two) times daily as needed (congestion).   Cholecalciferol (VITAMIN D3) 25 MCG (1000 UT) CAPS Take 1,000 Units by  mouth once a week.   clobetasol ointment (TEMOVATE) 03.01% Apply 1 Application topically daily as needed (irritation).   clonazePAM (KLONOPIN) 0.5 MG tablet Take 1 tablet by mouth twice daily as needed for anxiety   cloNIDine (CATAPRES) 0.1 MG tablet Take 1 tablet (0.1 mg total) by mouth daily.   famotidine (PEPCID) 20 MG tablet Take 20 mg by mouth at bedtime as needed for heartburn.   ferrous sulfate 325 (65 FE) MG tablet Take 1 tablet (325 mg total) by mouth daily with breakfast.   fluconazole (DIFLUCAN) 150 MG tablet Take 150 mg by mouth once.   fluticasone (FLONASE) 50 MCG/ACT nasal spray Place 1 spray into both nostrils daily as needed for allergies or rhinitis.   levocetirizine (XYZAL ALLERGY 24HR) 5 MG tablet Take 1 tablet (5 mg total) by mouth every evening.   losartan (COZAAR) 100 MG tablet Take 1 tablet by mouth once daily   nitroGLYCERIN (NITROSTAT) 0.4 MG SL tablet DISSOLVE ONE TABLET UNDER THE TONGUE EVERY 5 MINUTES AS NEEDED FOR CHEST PAIN.  DO NOT EXCEED A TOTAL OF 3 DOSES IN 15 MINUTES   potassium chloride SA (KLOR-CON M) 10 MEQ tablet Take 2 tablets (20 mEq total) by mouth daily.   ranolazine (RANEXA)  500 MG 12 hr tablet Take 500 mg by mouth as needed.   spironolactone (ALDACTONE) 25 MG tablet Take 1 tablet by mouth once daily   torsemide (DEMADEX) 20 MG tablet Take 4 tablets (80 mg total) by mouth 2 (two) times daily.   No facility-administered encounter medications on file as of 08/11/2022.    Allergies (verified) Contrast media [iodinated contrast media], Clonidine derivatives, Statins, Sulfa antibiotics, Celebrex [celecoxib], Isosorbide nitrate, Other, and Tape   History: Past Medical History:  Diagnosis Date   Allergy    rhinitis   Aortic stenosis    mild by echo 06/2017   Arthritis    Bradycardia    a. 10/2017 -> beta blocker cut back due to HR 39.   Breast cancer ([place]) 01/06/2012   Cancer ([place])    right colon and left breast   Chronic diastolic CHF (congestive  heart failure) ([place])    Colon cancer ([place]) 01/06/2012   Colovesical fistula    Dr. Marlou Starks and [name] planning surgery 08/2018- surgery revealed spontaneous closure   COPD (chronic obstructive pulmonary disease) ([place])    pt. denies   Coronary artery disease 2006   a.  NSTEMI in 2016, cath showed 15% prox-mid RCA, 20% prox LAD, EF 25-35% by cath and 35-40% -> felt due to Takotsubo cardiomyopathy.   Dilated aortic root ([place])    47mHg by echo 06/2017   Diverticulosis    Dyspnea    Edema extremities    GERD (gastroesophageal reflux disease)    Hernia    Hiatal hernia    denies   Hyperlipidemia    Hypertension    Mild aortic stenosis    echo 11/2015 but not noted on echo 06/2016   Osteopenia    Permanent atrial fibrillation (HCC)    chronic atrial fibrillation   Pneumonia    hx child   Pulmonary HTN ([place]    a. moderate to severe PASP 662mg echo 11/2015 - now 4125m by echo 06/2017. CTA chest in 11/16 with no PE. PFTs in 7/15 with mild obstructive lung disease. She had a negative sleep study in 2017. b. Felt due to left sided HF.   [facility]  Takotsubo syndrome 07/29/2015   a. EF 35-40% by echo; akinesis of mid-apical anteroseptal and apical myocardium.  EF now normalized on echo 11/2015   Past Surgical History:  Procedure Laterality Date   APPENDECTOMY     BREAST SURGERY     lumpectomy left   CARDIAC CATHETERIZATION     CARDIAC CATHETERIZATION N/A 07/28/2015   Procedure: Left Heart Cath and Coronary Angiography;  Surgeon: Peter M JorMartiniqueD;  Location: [facility];  Service: Cardiovascular;  Laterality: N/A;   CHOLECYSTECTOMY     COLECTOMY     right side   CYSTOSCOPY WITH STENT PLACEMENT Left 09/04/2018   Procedure: CYSTOSCOPY WITH LEFT STENT PLACEMENT, BLADDER REPAIR, CYSTOSCOPY WITH LEFT STENT REMOVAL;  Surgeon: BorRaynelle BringD;  Location: [facility];  Service: Urology;  Laterality: Left;   EXCISION OF ACCESSORY NIPPLE Bilateral 05/30/2013   Procedure: BILATERAL  NIPPLE BIOPSY;  Surgeon: PauMerrie RoofD;  [facility];  Laterality: Bilateral;   EYE SURGERY Bilateral 12   cataracts   HYSTEROSCOPY WITH D & C N/A 05/29/2020   Procedure: DILATATION AND CURETTAGE /HYSTEROSCOPY;  Surgeon: SchHomero FellersD;  Location: [facility];  Service: Gynecology;  Laterality: N/A;   IR RADIOLOGIST EVAL & MGMT  07/26/2017  LAPAROSCOPIC RIGHT COLECTOMY N/A 09/04/2018   Procedure: LAPAROSCOPIC ASSISTED SIGMOID COLECTOMY WITH REPAIR OF FISTULA TO BLADDER;  Surgeon: Jovita Kussmaul, MD;  Location: [facility];  Service: General;  Laterality: N/A;   RIGHT/LEFT HEART CATH AND CORONARY ANGIOGRAPHY N/A 12/10/2020   Procedure: RIGHT/LEFT HEART CATH AND CORONARY ANGIOGRAPHY;  Surgeon: Larey Dresser, MD;  Location: [facility];  Service: Cardiovascular;  Laterality: N/A;   SPLIT NIGHT STUDY  02/02/2016       Family History  Problem Relation Age of Onset   Heart disease Mother    Heart attack Mother    Cancer Sister        stomach and colon   Heart disease Brother 27   Hypertension Father    Cancer Sister    Stroke Neg Hx    Social History   Socioeconomic History   Marital status: Widowed    Spouse name: Not on file   Number of children: 1   Years of education: Not on file   Highest education level: Not on file  Occupational History   Not on file  Tobacco Use   Smoking status: Never   Smokeless tobacco: Never  Vaping Use   Vaping Use: Never used  Substance and Sexual Activity   Alcohol use: No   Drug use: No   Sexual activity: Not Currently  Other Topics Concern   Not on file  Social History Narrative   Husband Braulio Conte, passed away in [place] 23, 2023. Pt's son, Sherlon Handing lives with patient and helps with her care.    Social Determinants of Health   Financial Resource Strain: Low Risk  (08/11/2022)   Overall Financial Resource Strain (CARDIA)    Difficulty of Paying Living Expenses: Not hard at all  Food Insecurity: No Food  Insecurity (08/11/2022)   Hunger Vital Sign    Worried About Running Out of Food in the Last Year: Never true    Ran Out of Food in the Last Year: Never true  Transportation Needs: No Transportation Needs (08/11/2022)   PRAPARE - Hydrologist (Medical): No    Lack of Transportation (Non-Medical): No  Physical Activity: Insufficiently Active (08/11/2022)   Exercise Vital Sign    Days of Exercise per Week: 5 days    Minutes of Exercise per Session: 10 min  Stress: No Stress Concern Present (08/11/2022)   [place]    Feeling of Stress : Not at all  Social Connections: Moderately Isolated (08/11/2022)   Social Connection and Isolation Panel [NHANES]    Frequency of Communication with Friends and Family: More than three times a week    Frequency of Social Gatherings with Friends and Family: Twice a week    Attends Religious Services: More than 4 times per year    Active Member of [employer] or Organizations: No    Attends Archivist Meetings: Never    Marital Status: Widowed    Tobacco Counseling Counseling given: Not Answered   Clinical Intake:  Pre-visit preparation completed: Yes  Pain : 0-10 Pain Score: 5  Pain Type: Chronic pain Pain Location: Knee Pain Descriptors / Indicators: Aching Pain Onset: More than a month ago Pain Frequency: Intermittent     BMI - recorded: 32.17 Nutritional Status: BMI > 30  Obese Nutritional Risks: Other (Comment) Diabetes: No  How often do you need to have someone help you when you read instructions, pamphlets, or other written  materials from your doctor or pharmacy?: 1 - Never  Diabetic?NO  Interpreter Needed?: No  Information entered by :: mj Kaytelynn Scripter, lpn   Activities of Daily Living    08/11/2022    9:41 AM 03/15/2022   10:40 PM  In your present state of health, do you have any difficulty performing the following activities:   Hearing? 0   Vision? 0   Difficulty concentrating or making decisions? 0   Walking or climbing stairs? 1   Comment Uses cane   Dressing or bathing? 0   Doing errands, shopping? 0   Preparing Food and eating ? N   Using the Toilet? N   In the past six months, have you accidently leaked urine? N   Do you have problems with loss of bowel control? N   Managing your Medications? N   Managing your Finances? N   Housekeeping or managing your Housekeeping? N      Information is confidential and restricted. Go to Review Flowsheets to unlock data.    Patient Care Team: [name], MD as PCP - General (Family Medicine) [name], MD as PCP - Cardiology (Cardiology) Larey Dresser, MD as PCP - Advanced Heart Failure (Cardiology) Edythe Clarity, [facility] as Pharmacist (Pharmacist)  Indicate any recent Medical Services you Dolley have received from other than [facility] providers in the past year (date [place] be approximate).     Assessment:   This is a routine wellness examination for Nash.  Hearing/Vision screen Hearing Screening - Comments:: No hearing issues.  Vision Screening - Comments:: Glasses. [place]  Dietary issues and exercise activities discussed: Current Exercise Habits: Home exercise routine, Type of exercise: stretching;walking, Time (Minutes): 10, Frequency (Times/Week): 5, Weekly Exercise (Minutes/Week): 50, Intensity: Mild, Exercise limited by: cardiac condition(s)   Goals Addressed             This Visit's Progress    Exercise 3x per week (30 min per time)       Encouraged pt to do chair exercise as tolerated.        Depression Screen    08/11/2022    9:33 AM 07/26/2022    8:50 AM 07/14/2021    9:32 AM 09/30/2020   12:32 PM 07/03/2020    9:43 AM 10/12/2019   11:29 AM 06/22/2018   12:05 PM  PHQ 2/9 Scores  PHQ - 2 Score 0 0 0 0 0 0 0  PHQ- 9 Score 0 0         Fall Risk    08/11/2022    9:41 AM 07/26/2022    8:51 AM 07/14/2021    9:32 AM  09/30/2020   12:31 PM 07/03/2020    9:45 AM  Fall Risk   Falls in the past year? 0 0 1 0 0  Number falls in past yr: 0  0 0 0  Injury with Fall? 0  0 0 0  Risk for fall due to : No Fall Risks  Impaired mobility    Follow up Falls prevention discussed  Falls evaluation completed Falls evaluation completed     FALL RISK PREVENTION PERTAINING TO THE HOME:  Any stairs in or around the home? No  If so, are there any without handrails? No  Home free of loose throw rugs in walkways, pet beds, electrical cords, etc? Yes  Adequate lighting in your home to reduce risk of falls? Yes   ASSISTIVE DEVICES UTILIZED TO PREVENT FALLS:  Life alert? No  Use of a cane, walker or w/c? Yes  Grab bars in the bathroom? Yes  Shower chair or bench in shower? Yes  Elevated toilet seat or a handicapped toilet? Yes   TIMED UP AND GO:  Was the test performed? Yes .  Length of time to ambulate 10 feet: 20 sec.   Gait slow and steady with assistive device  Cognitive Function:        08/11/2022    9:42 AM  6CIT Screen  What Year? 0 points  What month? 0 points  What time? 0 points  Count back from 20 0 points  Months in reverse 4 points  Repeat phrase 2 points  Total Score 6 points    Immunizations Immunization History  Administered Date(s) Administered   Fluad Quad(high Dose 65+) 06/18/2019, 07/03/2020, 07/02/2021, 07/26/2022   Influenza Split 07/07/2010   Influenza, High Dose Seasonal PF 07/14/2018   Influenza,inj,Quad PF,6+ Mos 07/25/2013, 05/19/2015, 07/08/2016   Meningococcal Polysaccharide 07/08/2007   PFIZER(Purple Top)SARS-COV-2 Vaccination 10/01/2019, 10/22/2019, 07/13/2020   Pneumococcal Conjugate-13 03/14/2014   Pneumococcal Polysaccharide-23 07/15/2005   Td 04/06/2010   Tdap 05/15/2020   Zoster, Live 02/10/2015    TDAP status: Up to date  Flu Vaccine status: Up to date  Pneumococcal vaccine status: Up to date  Covid-19 vaccine status: Completed vaccines  Qualifies  for Shingles Vaccine? Yes   Zostavax completed Yes   Shingrix Completed?: No.    Education has been provided regarding the importance of this vaccine. Patient has been advised to call insurance company to determine out of pocket expense if they have not yet received this vaccine. Advised [place] also receive vaccine at local pharmacy or Health Dept. Verbalized acceptance and understanding.  Screening Tests Health Maintenance  Topic Date Due   COVID-19 Vaccine (4 - 2023-24 season) 10/06/2022 (Originally 05/07/2022)   Zoster Vaccines- Shingrix (1 of 2) 11/10/2022 (Originally 12/07/1953)   Medicare Annual Wellness (AWV)  08/12/2023   DTaP/Tdap/Td (3 - Td or Tdap) 05/15/2030   Pneumonia Vaccine 70+ Years old  Completed   INFLUENZA VACCINE  Completed   DEXA SCAN  Completed   HPV VACCINES  Aged Out    Health Maintenance  There are no preventive care reminders to display for this patient.   Colorectal cancer screening: No longer required.   Mammogram status: No longer required due to AGE.  Bone Density status: Completed 02/04/2021. Results reflect: Bone density results: OSTEOPOROSIS. Repeat every 2 years.  Lung Cancer Screening: (Low Dose CT Chest recommended if Age 75-80 years, 30 pack-year currently smoking OR have quit w/in 15years.) does not qualify.   Lung Cancer Screening Referral: N/A  Additional Screening:  Hepatitis C Screening: does not qualify; Completed N/A  Vision Screening: Recommended annual ophthalmology exams for early detection of glaucoma and other disorders of the eye. Is the patient up to date with their annual eye exam?  Yes  Who is the provider or what is the name of the office in which the patient attends annual eye exams? [facility]  If pt is not established with a provider, would they like to be referred to a provider to establish care? No .   Dental Screening: Recommended annual dental exams for proper oral hygiene  Community Resource Referral / Chronic Care  Management: CRR required this visit?  No   CCM required this visit?  No      Plan:     I have personally reviewed and noted the following in the patient's chart:  Medical and social history Use of alcohol, tobacco or illicit drugs  Current medications and supplements including opioid prescriptions. Patient is not currently taking opioid prescriptions. Functional ability and status Nutritional status Physical activity Advanced directives List of other physicians Hospitalizations, surgeries, and ER visits in previous 12 months Vitals Screenings to include cognitive, depression, and falls Referrals and appointments  In addition, I have reviewed and discussed with patient certain preventive protocols, quality metrics, and best practice recommendations. A written personalized care plan for preventive services as well as general preventive health recommendations were provided to patient.     Chriss Driver, LPN   08/07/4824   Nurse Notes: Discussed Shingrix vaccine and how to obtain.

## 2022-08-19 ENCOUNTER — Ambulatory Visit: Payer: Medicare Other | Admitting: Family Medicine

## 2022-08-19 ENCOUNTER — Other Ambulatory Visit: Payer: Self-pay | Admitting: Family Medicine

## 2022-08-19 DIAGNOSIS — Z09 Encounter for follow-up examination after completed treatment for conditions other than malignant neoplasm: Secondary | ICD-10-CM | POA: Diagnosis not present

## 2022-08-19 DIAGNOSIS — Z85828 Personal history of other malignant neoplasm of skin: Secondary | ICD-10-CM | POA: Diagnosis not present

## 2022-08-19 DIAGNOSIS — D235 Other benign neoplasm of skin of trunk: Secondary | ICD-10-CM | POA: Diagnosis not present

## 2022-08-19 DIAGNOSIS — L309 Dermatitis, unspecified: Secondary | ICD-10-CM | POA: Diagnosis not present

## 2022-08-19 DIAGNOSIS — K5792 Diverticulitis of intestine, part unspecified, without perforation or abscess without bleeding: Secondary | ICD-10-CM

## 2022-08-19 DIAGNOSIS — F064 Anxiety disorder due to known physiological condition: Secondary | ICD-10-CM

## 2022-08-20 ENCOUNTER — Ambulatory Visit (INDEPENDENT_AMBULATORY_CARE_PROVIDER_SITE_OTHER): Payer: Medicare Other | Admitting: Family Medicine

## 2022-08-20 VITALS — BP 126/72 | HR 65 | Ht 63.0 in | Wt 181.0 lb

## 2022-08-20 DIAGNOSIS — K921 Melena: Secondary | ICD-10-CM

## 2022-08-20 DIAGNOSIS — R1319 Other dysphagia: Secondary | ICD-10-CM | POA: Diagnosis not present

## 2022-08-20 DIAGNOSIS — K219 Gastro-esophageal reflux disease without esophagitis: Secondary | ICD-10-CM

## 2022-08-20 NOTE — Telephone Encounter (Signed)
Requested medications are due for refill today.  yes  Requested medications are on the active medications list.  yes  Last refill. 06/14/2022 #60 0 rf  Future visit scheduled.   yes  Notes to clinic.  Refill not delegated.    Requested Prescriptions  Pending Prescriptions Disp Refills   clonazePAM (KLONOPIN) 0.5 MG tablet [Pharmacy Med Name: clonazePAM 0.5 MG Oral Tablet] 60 tablet 0    Sig: Take 1 tablet by mouth twice daily as needed for anxiety     Not Delegated - Psychiatry: Anxiolytics/Hypnotics 2 Failed - 08/19/2022  8:26 PM      Failed - This refill cannot be delegated      Failed - Urine Drug Screen completed in last 360 days      Failed - Valid encounter within last 6 months    Recent Outpatient Visits           7 months ago Chronic diastolic CHF (congestive heart failure) ([place])   [place] [name], Cammie Mcgee, MD   10 months ago Dyspnea, unspecified type   [place] [name], MD   10 months ago Dyspnea, unspecified type   [place] [name], MD   11 months ago Chest wall pain   [place] [name], Cammie Mcgee, MD   1 year ago Anemia, unspecified type   [place] [name], Cammie Mcgee, MD       Future Appointments             Today [name], Cammie Mcgee, MD [place], [place]   In 2 months [name], Eber Hong, MD [place]            Passed - Patient is not pregnant

## 2022-08-20 NOTE — Progress Notes (Signed)
Subjective:    Patient ID: [name], female    DOB: 10-30-1934, 87 y.o.   MRN: [id number] Patient is a very sweet 87 year old Caucasian female with complicated medical history.  She has a history of a right hemicolectomy.  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  She is also had her gallbladder removed.  She is on Eliquis for atrial fibrillation.  She states over the last month, she has been having a epigastric pain.  It is a nagging burning pain.  There is no exacerbating or alleviating factors.  She is not constipated.  However she has noticed a change in her stool.  She states that her stool has always been dark because of her iron pills however recently has become very black.  She denies any constipation.  She denies any diarrhea.  She denies any vomiting.  She does report increased gas and belching.  She states that she is also occasionally having dysphagia.  She states that foods such as meat or bread will sometimes get stuck when she swallows or have a difficult time passing. Past Medical History:  Diagnosis Date   Allergy    rhinitis   Aortic stenosis    mild by echo 06/2017   Arthritis    Bradycardia    a. 10/2017 -> beta blocker cut back due to HR 39.   Breast cancer ([place]) 01/06/2012   Cancer ([place])    right colon and left breast   Chronic diastolic CHF (congestive heart failure) ([place])    Colon cancer ([place]) 01/06/2012   Colovesical fistula    Dr. Marlou Starks and [name] planning surgery 08/2018- surgery revealed spontaneous closure   COPD (chronic obstructive pulmonary disease) ([place])    pt. denies   Coronary artery disease 2006   a.  NSTEMI in 2016, cath showed 15% prox-mid RCA, 20% prox LAD, EF 25-35% by cath and 35-40% -> felt due to Takotsubo cardiomyopathy.   Dilated aortic root ([place])    44mHg by echo 06/2017   Diverticulosis    Dyspnea    Edema extremities    GERD (gastroesophageal reflux disease)    Hernia    Hiatal hernia    denies   Hyperlipidemia     Hypertension    Mild aortic stenosis    echo 11/2015 but not noted on echo 06/2016   Osteopenia    Permanent atrial fibrillation (H[facility])    chronic atrial fibrillation   Pneumonia    hx child   [place] [place]    a. moderate to severe PASP 697mg echo 11/2015 - now 4139m by echo 06/2017. CTA chest in 11/16 with no PE. PFTs in 7/15 with mild obstructive lung disease. She had a negative sleep study in 2017. b. Felt due to left sided HF.   [facility]  Takotsubo syndrome 07/29/2015   a. EF 35-40% by echo; akinesis of mid-apical anteroseptal and apical myocardium.  EF now normalized on echo 11/2015   Past Surgical History:  Procedure Laterality Date   APPENDECTOMY     BREAST SURGERY     lumpectomy left   CARDIAC CATHETERIZATION     CARDIAC CATHETERIZATION N/A 07/28/2015   Procedure: Left Heart Cath and Coronary Angiography;  Surgeon: Peter M JorMartiniqueD;  Location: [facility];  Service: Cardiovascular;  Laterality: N/A;   CHOLECYSTECTOMY     COLECTOMY     right side   CYSTOSCOPY WITH STENT PLACEMENT Left 09/04/2018   Procedure:  CYSTOSCOPY WITH LEFT STENT PLACEMENT, BLADDER REPAIR, CYSTOSCOPY WITH LEFT STENT REMOVAL;  Surgeon: Raynelle Bring, MD;  Location: [facility];  Service: Urology;  Laterality: Left;   EXCISION OF ACCESSORY NIPPLE Bilateral 05/30/2013   Procedure: BILATERAL NIPPLE BIOPSY;  Surgeon: Merrie Roof, MD;  Location: [facility];  Service: General;  Laterality: Bilateral;   EYE SURGERY Bilateral 12   cataracts   HYSTEROSCOPY WITH D & C N/A 05/29/2020   Procedure: DILATATION AND CURETTAGE /HYSTEROSCOPY;  Surgeon: Homero Fellers, MD;  Location: [facility];  Service: Gynecology;  Laterality: N/A;   IR RADIOLOGIST EVAL & MGMT  07/26/2017   LAPAROSCOPIC RIGHT COLECTOMY N/A 09/04/2018   Procedure: LAPAROSCOPIC ASSISTED SIGMOID COLECTOMY WITH REPAIR OF FISTULA TO BLADDER;  Surgeon: Jovita Kussmaul, MD;  Location: [facility];  Service: General;  Laterality: N/A;   RIGHT/LEFT  HEART CATH AND CORONARY ANGIOGRAPHY N/A 12/10/2020   Procedure: RIGHT/LEFT HEART CATH AND CORONARY ANGIOGRAPHY;  Surgeon: Larey Dresser, MD;  Location: [facility];  Service: Cardiovascular;  Laterality: N/A;   SPLIT NIGHT STUDY  02/02/2016       Current Outpatient Medications on File Prior to Visit  Medication Sig Dispense Refill   acetaminophen (TYLENOL) 500 MG tablet Take 1,000 mg by mouth every 6 (six) hours as needed for headache or moderate pain.     amLODipine (NORVASC) 5 MG tablet Take 1 tablet (5 mg total) by mouth daily. 90 tablet 3   apixaban (ELIQUIS) 5 MG TABS tablet Take 1 tablet (5 mg total) by mouth 2 (two) times daily. 60 tablet 6   Azelastine HCl 137 MCG/SPRAY SOLN Place 1 spray into both nostrils 2 (two) times daily as needed (congestion).     Cholecalciferol (VITAMIN D3) 25 MCG (1000 UT) CAPS Take 1,000 Units by mouth once a week.     clobetasol ointment (TEMOVATE) 1.74 % Apply 1 Application topically daily as needed (irritation).     clonazePAM (KLONOPIN) 0.5 MG tablet Take 1 tablet by mouth twice daily as needed for anxiety 60 tablet 0   cloNIDine (CATAPRES) 0.1 MG tablet Take 1 tablet (0.1 mg total) by mouth daily. 90 tablet 3   famotidine (PEPCID) 20 MG tablet Take 20 mg by mouth at bedtime as needed for heartburn.     ferrous sulfate 325 (65 FE) MG tablet Take 1 tablet (325 mg total) by mouth daily with breakfast. 90 tablet 3   fluconazole (DIFLUCAN) 150 MG tablet Take 150 mg by mouth once.     fluticasone (FLONASE) 50 MCG/ACT nasal spray Place 1 spray into both nostrils daily as needed for allergies or rhinitis.     levocetirizine (XYZAL ALLERGY 24HR) 5 MG tablet Take 1 tablet (5 mg total) by mouth every evening. 30 tablet 0   losartan (COZAAR) 100 MG tablet Take 1 tablet by mouth once daily 90 tablet 1   nitroGLYCERIN (NITROSTAT) 0.4 MG SL tablet DISSOLVE ONE TABLET UNDER THE TONGUE EVERY 5 MINUTES AS NEEDED FOR CHEST PAIN.  DO NOT EXCEED A TOTAL OF 3 DOSES IN  15 MINUTES 25 tablet 8   potassium chloride SA (KLOR-CON M) 10 MEQ tablet Take 2 tablets (20 mEq total) by mouth daily. 60 tablet 6   ranolazine (RANEXA) 500 MG 12 hr tablet Take 500 mg by mouth as needed.     spironolactone (ALDACTONE) 25 MG tablet Take 1 tablet by mouth once daily 90 tablet 1   torsemide (DEMADEX) 20 MG tablet Take 4 tablets (80  mg total) by mouth 2 (two) times daily. 240 tablet 6   No current facility-administered medications on file prior to visit.   Allergies  Allergen Reactions   Contrast Media [Iodinated Contrast Media] Hives and Other (See Comments)    Per pt strong burning sensation starting in chest radiating outward    Clonidine Derivatives Other (See Comments)    Throat dry   Statins Other (See Comments)    "bones hurt"   Sulfa Antibiotics Diarrhea    Tremors    Celebrex [Celecoxib] Rash   Isosorbide Nitrate Itching and Rash   Other Itching and Rash    Plastic and paper tape and heart monitor pads   Tape Itching and Rash    Red Where applied and will spread   Social History   Socioeconomic History   Marital status: Widowed    Spouse name: Not on file   Number of children: 1   Years of education: Not on file   Highest education level: Not on file  Occupational History   Not on file  Tobacco Use   Smoking status: Never   Smokeless tobacco: Never  Vaping Use   Vaping Use: Never used  Substance and Sexual Activity   Alcohol use: No   Drug use: No   Sexual activity: Not Currently  Other Topics Concern   Not on file  Social History Narrative   Husband Braulio Conte, passed away in 21-[place]-2023. Pt's son, Sherlon Handing lives with patient and helps with her care.    Social Determinants of Health   Financial Resource Strain: Low Risk  (08/11/2022)   Overall Financial Resource Strain (CARDIA)    Difficulty of Paying Living Expenses: Not hard at all  Food Insecurity: No Food Insecurity (08/11/2022)   Hunger Vital Sign    Worried About Running Out of Food  in the Last Year: Never true    Ran Out of Food in the Last Year: Never true  Transportation Needs: No Transportation Needs (08/11/2022)   PRAPARE - Hydrologist (Medical): No    Lack of Transportation (Non-Medical): No  Physical Activity: Insufficiently Active (08/11/2022)   Exercise Vital Sign    Days of Exercise per Week: 5 days    Minutes of Exercise per Session: 10 min  Stress: No Stress Concern Present (08/11/2022)   [place]    Feeling of Stress : Not at all  Social Connections: Moderately Isolated (08/11/2022)   Social Connection and Isolation Panel [NHANES]    Frequency of Communication with Friends and Family: More than three times a week    Frequency of Social Gatherings with Friends and Family: Twice a week    Attends Religious Services: More than 4 times per year    Active Member of [employer] or Organizations: No    Attends Archivist Meetings: Never    Marital Status: Widowed  Intimate Partner Violence: Not At Risk (08/11/2022)   Humiliation, Afraid, Rape, and Kick questionnaire    Fear of Current or Ex-Partner: No    Emotionally Abused: No    Physically Abused: No    Sexually Abused: No      Review of Systems  Musculoskeletal:  Positive for back pain.  All other systems reviewed and are negative.      Objective:   Physical Exam Vitals reviewed.  Constitutional:      General: She is not in acute distress.    Appearance: Normal  appearance. She is well-developed and normal weight. She is not diaphoretic.  HENT:     Left Ear: Ear canal normal.     Nose: No congestion or rhinorrhea.     Mouth/Throat:     Mouth: Mucous membranes are moist.     Pharynx: No oropharyngeal exudate or posterior oropharyngeal erythema.  Eyes:     General:        Right eye: No discharge.        Left eye: No discharge.     Conjunctiva/sclera: Conjunctivae normal.  Neck:      Vascular: No carotid bruit.  Cardiovascular:     Rate and Rhythm: Normal rate and regular rhythm.     Heart sounds: Murmur heard.     No friction rub. No gallop.  Pulmonary:     Effort: Pulmonary effort is normal. No accessory muscle usage, prolonged expiration or respiratory distress.     Breath sounds: Normal breath sounds and air entry. No stridor, decreased air movement or transmitted upper airway sounds. No decreased breath sounds, wheezing, rhonchi or rales.  Chest:     Chest wall: No mass, lacerations, deformity, swelling or crepitus.  Abdominal:     General: Abdomen is flat. Bowel sounds are normal. There is no distension.     Palpations: Abdomen is not rigid.     Tenderness: There is no abdominal tenderness. There is no guarding or rebound.     Hernia: No hernia is present.  Musculoskeletal:     Cervical back: No rigidity or tenderness.     Right lower leg: Edema present.     Left lower leg: Edema present.  Skin:    Findings: No bruising or lesion.  Neurological:     Mental Status: She is alert. Mental status is at baseline.     Motor: No abnormal muscle tone.     Coordination: Coordination normal.     Gait: Gait normal.     Deep Tendon Reflexes: Reflexes normal.  Psychiatric:        Mood and Affect: Mood normal.        Behavior: Behavior normal.        Thought Content: Thought content normal.        Judgment: Judgment normal.          Assessment & Plan:  [place] - Plan: CBC with Differential/Platelet, COMPLETE METABOLIC PANEL WITH GFR, Fecal Globin By Immunochemistry  Esophageal dysphagia  Gastroesophageal reflux disease, unspecified whether esophagitis present Patient symptoms sound GERD versus peptic ulcer disease.  I am concerned that the change in her stool [place] reflect blood.  Therefore I will recheck a CBC.  Her hemoglobin in November was 10.  If there is a considerable drop in her hemoglobin she will need an EGD and we will also need to discontinue her  Eliquis.  I will also check her stool for blood.  Her medicine list says she is taking Pepcid however there is a discrepancy with the patient regarding this.  She believes she is on pantoprazole.  Therefore of asked the patient to go home and verify her medicines.  If she is not taking a proton pump inhibitor, I am going to start her on 1 and I would likely put her on pantoprazole 40 mg twice daily for possible peptic ulcer disease.  If she is taking a proton pump inhibitor I will add Pepcid.  If she is taking both of them, I will add sucralfate and consult GI for an EGD.  If the hemoglobin is stable and her stool test does not show any blood, I will attribute the dark stool to her iron pills

## 2022-08-21 LAB — COMPLETE METABOLIC PANEL WITH GFR
AG Ratio: 1.4 (calc) (ref 1.0–2.5)
ALT: 8 U/L (ref 6–29)
AST: 12 U/L (ref 10–35)
Albumin: 4.2 g/dL (ref 3.6–5.1)
Alkaline phosphatase (APISO): 89 U/L (ref 37–153)
BUN/Creatinine Ratio: 47 (calc) — ABNORMAL HIGH (ref 6–22)
BUN: 85 mg/dL — ABNORMAL HIGH (ref 7–25)
CO2: 27 mmol/L (ref 20–32)
Calcium: 9.2 mg/dL (ref 8.6–10.4)
Chloride: 94 mmol/L — ABNORMAL LOW (ref 98–110)
Creat: 1.81 mg/dL — ABNORMAL HIGH (ref 0.60–0.95)
Globulin: 2.9 g/dL (calc) (ref 1.9–3.7)
Glucose, Bld: 107 mg/dL — ABNORMAL HIGH (ref 65–99)
Potassium: 5.2 mmol/L (ref 3.5–5.3)
Sodium: 132 mmol/L — ABNORMAL LOW (ref 135–146)
Total Bilirubin: 0.4 mg/dL (ref 0.2–1.2)
Total Protein: 7.1 g/dL (ref 6.1–8.1)
eGFR: 27 mL/min/{1.73_m2} — ABNORMAL LOW (ref >=60)

## 2022-08-21 LAB — CBC WITH DIFFERENTIAL/PLATELET
Absolute Monocytes: 758 cells/uL (ref 200–950)
Basophils Absolute: 21 cells/uL (ref 0–200)
Basophils Relative: 0.4 %
Eosinophils Absolute: 80 cells/uL (ref 15–500)
Eosinophils Relative: 1.5 %
HCT: 32.9 % — ABNORMAL LOW (ref 35.0–45.0)
Hemoglobin: 11.1 g/dL — ABNORMAL LOW (ref 11.7–15.5)
Lymphs Abs: 1574 cells/uL (ref 850–3900)
MCH: 31.4 pg (ref 27.0–33.0)
MCHC: 33.7 g/dL (ref 32.0–36.0)
MCV: 93.2 fL (ref 80.0–100.0)
MPV: 9.9 fL (ref 7.5–12.5)
Monocytes Relative: 14.3 %
Neutro Abs: 2867 cells/uL (ref 1500–7800)
Neutrophils Relative %: 54.1 %
Platelets: 241 10*3/uL (ref 140–400)
RBC: 3.53 10*6/uL — ABNORMAL LOW (ref 3.80–5.10)
RDW: 12.1 % (ref 11.0–15.0)
Total Lymphocyte: 29.7 %
WBC: 5.3 10*3/uL (ref 3.8–10.8)

## 2022-08-23 ENCOUNTER — Ambulatory Visit (HOSPITAL_COMMUNITY)
Admission: RE | Admit: 2022-08-23 | Discharge: 2022-08-23 | Disposition: A | Discharge status: Home / Self Care | Payer: Medicare Other | Source: Ambulatory Visit | Attending: Family Medicine | Admitting: Family Medicine

## 2022-08-23 DIAGNOSIS — I5032 Chronic diastolic (congestive) heart failure: Secondary | ICD-10-CM | POA: Insufficient documentation

## 2022-08-23 LAB — BASIC METABOLIC PANEL
Anion gap: 11 (ref 5–15)
BUN: 87 mg/dL — ABNORMAL HIGH (ref 8–23)
CO2: 25 mmol/L (ref 22–32)
Calcium: 9.4 mg/dL (ref 8.9–10.3)
Chloride: 96 mmol/L — ABNORMAL LOW (ref 98–111)
Creatinine, Ser: 1.93 mg/dL — ABNORMAL HIGH (ref 0.44–1.00)
GFR, Estimated: 25 mL/min — ABNORMAL LOW (ref >60)
Glucose, Bld: 86 mg/dL (ref 70–99)
Potassium: 5.3 mmol/L — ABNORMAL HIGH (ref 3.5–5.1)
Sodium: 132 mmol/L — ABNORMAL LOW (ref 135–145)

## 2022-08-25 ENCOUNTER — Telehealth (HOSPITAL_COMMUNITY): Payer: Self-pay

## 2022-08-25 DIAGNOSIS — I5032 Chronic diastolic (congestive) heart failure: Secondary | ICD-10-CM

## 2022-08-25 MED ORDER — TORSEMIDE 20 MG PO TABS: ORAL_TABLET | ORAL | 5 refills | Status: DC

## 2022-08-25 NOTE — Telephone Encounter (Signed)
-----   Message from Rafael Bihari, [place] sent at 08/24/2022  8:10 AM EST ----- K is elevated. Stop KCL suppl.   Kidney function elevated after increase in diuretics.  Hold torsemide and spiro x 1 day. Then, restart spiro at home dose of 25 mg daily , but restart torsemide at lower dose of 80 mg q am and 60 mg q pm.  Repeat BMET on Friday.

## 2022-08-31 ENCOUNTER — Other Ambulatory Visit: Payer: Self-pay

## 2022-08-31 ENCOUNTER — Encounter (HOSPITAL_COMMUNITY): Payer: Self-pay

## 2022-08-31 ENCOUNTER — Other Ambulatory Visit (HOSPITAL_COMMUNITY): Payer: Medicare Other

## 2022-08-31 ENCOUNTER — Emergency Department
Admission: EM | Admit: 2022-08-31 | Discharge: 2022-08-31 | Discharge status: Against Medical Advice | Payer: Medicare Other | Attending: Student | Admitting: Student

## 2022-08-31 DIAGNOSIS — Z5321 Procedure and treatment not carried out due to patient leaving prior to being seen by health care provider: Secondary | ICD-10-CM | POA: Diagnosis not present

## 2022-08-31 DIAGNOSIS — R109 Unspecified abdominal pain: Secondary | ICD-10-CM | POA: Diagnosis not present

## 2022-08-31 DIAGNOSIS — Z1152 Encounter for screening for COVID-19: Secondary | ICD-10-CM | POA: Diagnosis not present

## 2022-08-31 DIAGNOSIS — I10 Essential (primary) hypertension: Secondary | ICD-10-CM | POA: Diagnosis not present

## 2022-08-31 DIAGNOSIS — R112 Nausea with vomiting, unspecified: Secondary | ICD-10-CM | POA: Diagnosis not present

## 2022-08-31 DIAGNOSIS — R197 Diarrhea, unspecified: Secondary | ICD-10-CM | POA: Insufficient documentation

## 2022-08-31 DIAGNOSIS — I959 Hypotension, unspecified: Secondary | ICD-10-CM | POA: Diagnosis not present

## 2022-08-31 DIAGNOSIS — R110 Nausea: Secondary | ICD-10-CM | POA: Diagnosis not present

## 2022-08-31 LAB — COMPREHENSIVE METABOLIC PANEL
ALT: 10 U/L (ref 0–44)
AST: 20 U/L (ref 15–41)
Albumin: 4.5 g/dL (ref 3.5–5.0)
Alkaline Phosphatase: 82 U/L (ref 38–126)
Anion gap: 12 (ref 5–15)
BUN: 94 mg/dL — ABNORMAL HIGH (ref 8–23)
CO2: 23 mmol/L (ref 22–32)
Calcium: 9.8 mg/dL (ref 8.9–10.3)
Chloride: 99 mmol/L (ref 98–111)
Creatinine, Ser: 1.69 mg/dL — ABNORMAL HIGH (ref 0.44–1.00)
GFR, Estimated: 29 mL/min — ABNORMAL LOW (ref >60)
Glucose, Bld: 125 mg/dL — ABNORMAL HIGH (ref 70–99)
Potassium: 5.3 mmol/L — ABNORMAL HIGH (ref 3.5–5.1)
Sodium: 134 mmol/L — ABNORMAL LOW (ref 135–145)
Total Bilirubin: 1.4 mg/dL — ABNORMAL HIGH (ref 0.3–1.2)
Total Protein: 8.1 g/dL (ref 6.5–8.1)

## 2022-08-31 LAB — CBC WITH DIFFERENTIAL/PLATELET
Abs Immature Granulocytes: 0.05 10*3/uL (ref 0.00–0.07)
Basophils Absolute: 0 10*3/uL (ref 0.0–0.1)
Basophils Relative: 0 %
Eosinophils Absolute: 0 10*3/uL (ref 0.0–0.5)
Eosinophils Relative: 0 %
HCT: 34.8 % — ABNORMAL LOW (ref 36.0–46.0)
Hemoglobin: 11.3 g/dL — ABNORMAL LOW (ref 12.0–15.0)
Immature Granulocytes: 0 %
Lymphocytes Relative: 6 %
Lymphs Abs: 0.8 10*3/uL (ref 0.7–4.0)
MCH: 30.4 pg (ref 26.0–34.0)
MCHC: 32.5 g/dL (ref 30.0–36.0)
MCV: 93.5 fL (ref 80.0–100.0)
Monocytes Absolute: 1.1 10*3/uL — ABNORMAL HIGH (ref 0.1–1.0)
Monocytes Relative: 7 %
Neutro Abs: 12.9 10*3/uL — ABNORMAL HIGH (ref 1.7–7.7)
Neutrophils Relative %: 87 %
Platelets: 235 10*3/uL (ref 150–400)
RBC: 3.72 MIL/uL — ABNORMAL LOW (ref 3.87–5.11)
RDW: 13.6 % (ref 11.5–15.5)
WBC: 14.9 10*3/uL — ABNORMAL HIGH (ref 4.0–10.5)
nRBC: 0 % (ref 0.0–0.2)

## 2022-08-31 LAB — URINALYSIS, ROUTINE W REFLEX MICROSCOPIC
Bilirubin Urine: NEGATIVE
Glucose, UA: NEGATIVE mg/dL
Hgb urine dipstick: NEGATIVE
Ketones, ur: NEGATIVE mg/dL
Leukocytes,Ua: NEGATIVE
Nitrite: NEGATIVE
Protein, ur: NEGATIVE mg/dL
Specific Gravity, Urine: 1.011 (ref 1.005–1.030)
pH: 5 (ref 5.0–8.0)

## 2022-08-31 LAB — RESP PANEL BY RT-PCR (RSV, FLU A&B, COVID)  RVPGX2
Influenza A by PCR: NEGATIVE
Influenza B by PCR: NEGATIVE
Resp Syncytial Virus by PCR: NEGATIVE
SARS Coronavirus 2 by RT PCR: NEGATIVE

## 2022-08-31 LAB — LIPASE, BLOOD: Lipase: 165 U/L — ABNORMAL HIGH (ref 11–51)

## 2022-08-31 NOTE — ED Provider Triage Note (Signed)
Emergency Medicine Provider Triage Evaluation Note  [name] , a 87 y.o. female  was evaluated in triage.  Pt complains of n/v/d and abdominal pain since 7am. No sick contacts. Reports "low fever" but has been freezing all day. Also reports cat bite to right arm on Sunday.  Review of Systems  Positive: N/v/d abd cramping Negative: fever  Physical Exam  There were no vitals taken for this visit. Gen:   Awake, no distress   Resp:  Normal effort  MSK:   Moves extremities without difficulty  Other:  Redness to right arm   Medical Decision Making  Medically screening exam initiated at 3:00 PM.  Appropriate orders placed.  [name] was informed that the remainder of the evaluation will be completed by another provider, this initial triage assessment does not replace that evaluation, and the importance of remaining in the ED until their evaluation is complete.     Marquette Old, PA-C 08/31/22 1502

## 2022-08-31 NOTE — ED Triage Notes (Signed)
Pt comes via [facility] from home with c/o N/V/D since 7am today. Pt state she might have food poisoning.

## 2022-08-31 NOTE — ED Triage Notes (Signed)
Reports nausea and diarrhea and complaining of abd pain that started this am. Reports chills but no fever.

## 2022-09-01 ENCOUNTER — Encounter: Payer: Self-pay | Admitting: Family Medicine

## 2022-09-01 ENCOUNTER — Other Ambulatory Visit: Payer: Self-pay

## 2022-09-01 ENCOUNTER — Emergency Department (HOSPITAL_BASED_OUTPATIENT_CLINIC_OR_DEPARTMENT_OTHER)
Admission: EM | Admit: 2022-09-01 | Discharge: 2022-09-01 | Disposition: A | Discharge status: Home / Self Care | Payer: Medicare Other | Attending: Emergency Medicine | Admitting: Emergency Medicine

## 2022-09-01 ENCOUNTER — Emergency Department (HOSPITAL_BASED_OUTPATIENT_CLINIC_OR_DEPARTMENT_OTHER): Payer: Medicare Other

## 2022-09-01 ENCOUNTER — Encounter (HOSPITAL_BASED_OUTPATIENT_CLINIC_OR_DEPARTMENT_OTHER): Payer: Self-pay

## 2022-09-01 ENCOUNTER — Ambulatory Visit (INDEPENDENT_AMBULATORY_CARE_PROVIDER_SITE_OTHER): Payer: Medicare Other | Admitting: Family Medicine

## 2022-09-01 VITALS — BP 104/44 | HR 60 | Temp 98.3°F | Ht 63.0 in | Wt 171.0 lb

## 2022-09-01 DIAGNOSIS — Z85038 Personal history of other malignant neoplasm of large intestine: Secondary | ICD-10-CM | POA: Insufficient documentation

## 2022-09-01 DIAGNOSIS — I2510 Atherosclerotic heart disease of native coronary artery without angina pectoris: Secondary | ICD-10-CM | POA: Insufficient documentation

## 2022-09-01 DIAGNOSIS — R112 Nausea with vomiting, unspecified: Secondary | ICD-10-CM

## 2022-09-01 DIAGNOSIS — E860 Dehydration: Secondary | ICD-10-CM

## 2022-09-01 DIAGNOSIS — R109 Unspecified abdominal pain: Secondary | ICD-10-CM | POA: Diagnosis not present

## 2022-09-01 DIAGNOSIS — I509 Heart failure, unspecified: Secondary | ICD-10-CM | POA: Insufficient documentation

## 2022-09-01 DIAGNOSIS — L03113 Cellulitis of right upper limb: Secondary | ICD-10-CM | POA: Diagnosis not present

## 2022-09-01 DIAGNOSIS — Z853 Personal history of malignant neoplasm of breast: Secondary | ICD-10-CM | POA: Diagnosis not present

## 2022-09-01 DIAGNOSIS — N3000 Acute cystitis without hematuria: Secondary | ICD-10-CM

## 2022-09-01 DIAGNOSIS — I110 Hypertensive heart disease with heart failure: Secondary | ICD-10-CM | POA: Diagnosis not present

## 2022-09-01 DIAGNOSIS — Z7901 Long term (current) use of anticoagulants: Secondary | ICD-10-CM | POA: Diagnosis not present

## 2022-09-01 DIAGNOSIS — R1010 Upper abdominal pain, unspecified: Secondary | ICD-10-CM | POA: Diagnosis present

## 2022-09-01 DIAGNOSIS — R1084 Generalized abdominal pain: Secondary | ICD-10-CM

## 2022-09-01 DIAGNOSIS — I700 Atherosclerosis of aorta: Secondary | ICD-10-CM | POA: Diagnosis not present

## 2022-09-01 LAB — COMPREHENSIVE METABOLIC PANEL
ALT: 10 U/L (ref 0–44)
AST: 15 U/L (ref 15–41)
Albumin: 4.4 g/dL (ref 3.5–5.0)
Alkaline Phosphatase: 71 U/L (ref 38–126)
Anion gap: 13 (ref 5–15)
BUN: 95 mg/dL — ABNORMAL HIGH (ref 8–23)
CO2: 24 mmol/L (ref 22–32)
Calcium: 10.1 mg/dL (ref 8.9–10.3)
Chloride: 97 mmol/L — ABNORMAL LOW (ref 98–111)
Creatinine, Ser: 1.94 mg/dL — ABNORMAL HIGH (ref 0.44–1.00)
GFR, Estimated: 25 mL/min — ABNORMAL LOW (ref >60)
Glucose, Bld: 116 mg/dL — ABNORMAL HIGH (ref 70–99)
Potassium: 4.8 mmol/L (ref 3.5–5.1)
Sodium: 134 mmol/L — ABNORMAL LOW (ref 135–145)
Total Bilirubin: 0.8 mg/dL (ref 0.3–1.2)
Total Protein: 8 g/dL (ref 6.5–8.1)

## 2022-09-01 LAB — URINALYSIS, ROUTINE W REFLEX MICROSCOPIC
Bilirubin Urine: NEGATIVE
Glucose, UA: NEGATIVE mg/dL
Hgb urine dipstick: NEGATIVE
Ketones, ur: NEGATIVE mg/dL
Nitrite: NEGATIVE
Specific Gravity, Urine: 1.017 (ref 1.005–1.030)
WBC, UA: >50 WBC/hpf — ABNORMAL HIGH (ref 0–5)
pH: 5 (ref 5.0–8.0)

## 2022-09-01 LAB — LIPASE, BLOOD: Lipase: 84 U/L — ABNORMAL HIGH (ref 11–51)

## 2022-09-01 LAB — CBC
HCT: 34.4 % — ABNORMAL LOW (ref 36.0–46.0)
Hemoglobin: 11.2 g/dL — ABNORMAL LOW (ref 12.0–15.0)
MCH: 30.4 pg (ref 26.0–34.0)
MCHC: 32.6 g/dL (ref 30.0–36.0)
MCV: 93.2 fL (ref 80.0–100.0)
Platelets: 223 10*3/uL (ref 150–400)
RBC: 3.69 MIL/uL — ABNORMAL LOW (ref 3.87–5.11)
RDW: 13.9 % (ref 11.5–15.5)
WBC: 16.2 10*3/uL — ABNORMAL HIGH (ref 4.0–10.5)
nRBC: 0 % (ref 0.0–0.2)

## 2022-09-01 MED ORDER — ONDANSETRON 4 MG PO TBDP: 4.0000 mg | ORAL_TABLET | Freq: Three times a day (TID) | ORAL | 0 refills | Status: DC | PRN

## 2022-09-01 MED ORDER — AMOXICILLIN-POT CLAVULANATE 875-125 MG PO TABS: 1.0000 | ORAL_TABLET | Freq: Two times a day (BID) | ORAL | 0 refills | Status: DC

## 2022-09-01 MED ORDER — SODIUM CHLORIDE 0.9 % IV BOLUS
1000.0000 mL | Freq: Once | INTRAVENOUS | Status: AC
  Administered 2022-09-01: 1000 mL via INTRAVENOUS

## 2022-09-01 MED ORDER — ONDANSETRON HCL 4 MG/2ML IJ SOLN
4.0000 mg | Freq: Once | INTRAMUSCULAR | Status: AC
  Administered 2022-09-01: 4 mg via INTRAVENOUS
  Filled 2022-09-01: qty 2

## 2022-09-01 MED ORDER — SODIUM CHLORIDE 0.9 % IV SOLN
3.0000 g | Freq: Once | INTRAVENOUS | Status: AC
  Administered 2022-09-01: 3 g via INTRAVENOUS

## 2022-09-01 NOTE — ED Triage Notes (Signed)
Patient here POV from Home.  Endorses N/V/D that began yesterday AM. Associated with ABD pain as well. Went to another ED Last PM but LWBS due to Wait.   Seen by PCP today and sent for Possible Imaging.   NAD Noted during Triage. A&Ox4. GCS 15. Ambulatory.

## 2022-09-01 NOTE — ED Provider Notes (Signed)
[facility] EMERGENCY DEPT Provider Note   CSN: [id number] Arrival date & time: 09/01/22  1101     History  Chief Complaint  Patient presents with   Emesis    [name] is a 87 y.o. female.  Pt is a 87 yo female with a pmhx significant for colon and breast cancer, GERD, htn, arthritis, CAD, HLD, pulmonary htn, takotsubo syndrome, CHF, afib (on Eliquis), AS, and CVA.  Pt said she's had upper abd pain with n/v since 12/25.  She has not eaten anything except for a few crackers today since Monday.  She did go to [facility] last night, but left due to the wait.  Pt did see her pcp today who told her to come here for further eval.  Pt also mentions that she was scratched by her cat a few days ago and has redness to her right forearm.       Home Medications Prior to Admission medications   Medication Sig Start Date End Date Taking? Authorizing Provider  amoxicillin-clavulanate (AUGMENTIN) 875-125 MG tablet Take 1 tablet by mouth every 12 (twelve) hours. 09/01/22  Yes Isla Pence, MD  ondansetron (ZOFRAN-ODT) 4 MG disintegrating tablet Take 1 tablet (4 mg total) by mouth every 8 (eight) hours as needed for nausea or vomiting. 09/01/22  Yes Isla Pence, MD  acetaminophen (TYLENOL) 500 MG tablet Take 1,000 mg by mouth every 6 (six) hours as needed for headache or moderate pain.    [provider]  amLODipine (NORVASC) 5 MG tablet Take 1 tablet (5 mg total) by mouth daily. 01/01/22   Milford, Maricela Bo, FNP  apixaban (ELIQUIS) 5 MG TABS tablet Take 1 tablet (5 mg total) by mouth 2 (two) times daily. 12/01/21   Larey Dresser, MD  Azelastine HCl 137 MCG/SPRAY SOLN Place 1 spray into both nostrils 2 (two) times daily as needed (congestion). 06/10/21   [provider]  Cholecalciferol (VITAMIN D3) 25 MCG (1000 UT) CAPS Take 1,000 Units by mouth once a week.    [provider]  clobetasol ointment (TEMOVATE) 9.16 % Apply 1 Application topically daily as  needed (irritation).    [provider]  clonazePAM (KLONOPIN) 0.5 MG tablet Take 1 tablet by mouth twice daily as needed for anxiety 08/20/22   [name], MD  cloNIDine (CATAPRES) 0.1 MG tablet Take 1 tablet (0.1 mg total) by mouth daily. 07/15/22   Joette Catching, PA-C  famotidine (PEPCID) 20 MG tablet Take 20 mg by mouth at bedtime as needed for heartburn.    [provider]  ferrous sulfate 325 (65 FE) MG tablet Take 1 tablet (325 mg total) by mouth daily with breakfast. 07/04/20   [name], MD  fluconazole (DIFLUCAN) 150 MG tablet Take 150 mg by mouth once. 07/26/22   [provider]  fluticasone (FLONASE) 50 MCG/ACT nasal spray Place 1 spray into both nostrils daily as needed for allergies or rhinitis.    [provider]  levocetirizine (XYZAL ALLERGY 24HR) 5 MG tablet Take 1 tablet (5 mg total) by mouth every evening. 06/01/22   [name], MD  losartan (COZAAR) 100 MG tablet Take 1 tablet by mouth once daily 03/26/22   Turner, Eber Hong, MD  nitroGLYCERIN (NITROSTAT) 0.4 MG SL tablet DISSOLVE ONE TABLET UNDER THE TONGUE EVERY 5 MINUTES AS NEEDED FOR CHEST PAIN.  DO NOT EXCEED A TOTAL OF 3 DOSES IN 15 MINUTES 11/06/21   [name], MD  ranolazine [facility])  500 MG 12 hr tablet Take 500 mg by mouth as needed.    [provider]  spironolactone (ALDACTONE) 25 MG tablet Take 1 tablet by mouth once daily 03/05/22   [name], MD  torsemide (DEMADEX) 20 MG tablet Take 4 tablets (80 mg total) by mouth every morning AND 3 tablets (60 mg total) every evening. 08/25/22   Rafael Bihari, FNP      Allergies    Contrast media [iodinated contrast media], Clonidine derivatives, Statins, Sulfa antibiotics, Celebrex [celecoxib], Isosorbide nitrate, Other, and Tape    Review of Systems   Review of Systems  Gastrointestinal:  Positive for abdominal pain, nausea and vomiting.  All other systems reviewed and are  negative.   Physical Exam Updated Vital Signs BP (!) 131/45   Pulse (!) 59   Temp 98.1 F (36.7 C) (Oral)   Resp (!) 24   Ht '5\' 3"'$  (1.6 m)   Wt 77.6 kg   SpO2 95%   BMI 30.30 kg/m  Physical Exam Vitals and nursing note reviewed.  Constitutional:      Appearance: Normal appearance.  HENT:     Head: Normocephalic and atraumatic.     Right Ear: External ear normal.     Left Ear: External ear normal.     Nose: Nose normal.     Mouth/Throat:     Mouth: Mucous membranes are dry.  Eyes:     Extraocular Movements: Extraocular movements intact.     Conjunctiva/sclera: Conjunctivae normal.     Pupils: Pupils are equal, round, and reactive to light.  Cardiovascular:     Rate and Rhythm: Normal rate and regular rhythm.     Pulses: Normal pulses.     Heart sounds: Normal heart sounds.  Pulmonary:     Effort: Pulmonary effort is normal.     Breath sounds: Normal breath sounds.  Abdominal:     General: Abdomen is flat. Bowel sounds are normal.     Palpations: Abdomen is soft.     Tenderness: There is abdominal tenderness in the epigastric area.  Musculoskeletal:        General: Normal range of motion.     Cervical back: Normal range of motion and neck supple.  Skin:    General: Skin is warm.     Capillary Refill: Capillary refill takes less than 2 seconds.     Comments: See picture.  Cellulitis to right forearm.  Neurological:     General: No focal deficit present.     Mental Status: She is alert and oriented to person, place, and time.  Psychiatric:        Mood and Affect: Mood normal.        Behavior: Behavior normal.        ED Results / Procedures / Treatments   Labs (all labs ordered are listed, but only abnormal results are displayed) Labs Reviewed  LIPASE, BLOOD - Abnormal; Notable for the following components:      Result Value   Lipase 84 (*)    All other components within normal limits  COMPREHENSIVE METABOLIC PANEL - Abnormal; Notable for the following  components:   Sodium 134 (*)    Chloride 97 (*)    Glucose, Bld 116 (*)    BUN 95 (*)    Creatinine, Ser 1.94 (*)    GFR, Estimated 25 (*)    All other components within normal limits  CBC - Abnormal; Notable for the following components:   WBC  16.2 (*)    RBC 3.69 (*)    Hemoglobin 11.2 (*)    HCT 34.4 (*)    All other components within normal limits  URINALYSIS, ROUTINE W REFLEX MICROSCOPIC - Abnormal; Notable for the following components:   APPearance HAZY (*)    Protein, ur TRACE (*)    Leukocytes,Ua LARGE (*)    WBC, UA >50 (*)    Bacteria, UA MANY (*)    Non Squamous Epithelial 0-5 (*)    All other components within normal limits    EKG None  Radiology CT ABDOMEN PELVIS WO CONTRAST  Result Date: 09/01/2022 CLINICAL DATA:  Pancreatitis, acute, severe.  Abdominal pain EXAM: CT ABDOMEN AND PELVIS WITHOUT CONTRAST TECHNIQUE: Multidetector CT imaging of the abdomen and pelvis was performed following the standard protocol without IV contrast. RADIATION DOSE REDUCTION: This exam was performed according to the departmental dose-optimization program which includes automated exposure control, adjustment of the mA and/or kV according to patient size and/or use of iterative reconstruction technique. COMPARISON:  CT 07/16/2018, 09/11/2015 FINDINGS: Lower chest: No acute abnormality. Hepatobiliary: Hepatomegaly. No focal liver lesion identified on unenhanced images. Prior cholecystectomy. Pancreas: Fatty atrophy of the pancreas. 1.8 cm ovoid fluid attenuation structure along the inferior margin of the pancreatic head/uncinate process (series 2, image 31), not appreciably changed since 2017 and felt to be benign. No peripancreatic fat stranding or fluid. No ductal dilatation. Spleen: Chronic calcified granulomas throughout the spleen. Otherwise unremarkable. Adrenals/Urinary Tract: Adrenal glands are unremarkable. Kidneys are normal, without renal calculi, focal lesion, or hydronephrosis.  Bladder is unremarkable given the degree of distension. Stomach/Bowel: Stomach within normal limits. No dilated loops of bowel. Prior right hemicolectomy. Air-fluid level within the transverse colon, although no focal colonic wall thickening or inflammatory changes. Vascular/Lymphatic: Aortic atherosclerosis. No enlarged abdominal or pelvic lymph nodes. Reproductive: Uterus and bilateral adnexa are unremarkable. Other: No free fluid. No abdominopelvic fluid collection. No pneumoperitoneum. No abdominal wall hernia. Musculoskeletal: Chronic inferior endplate compression deformity at L1. No new or acute bony abnormality. IMPRESSION: 1. No acute abdominopelvic findings. Specifically, no CT evidence of acute pancreatitis. 2. Air-fluid level within the transverse colon, although no focal colonic wall thickening or inflammatory changes. Correlate for diarrheal illness. 3. Hepatomegaly. 4. Aortic atherosclerosis  (ICD10-I70.0). Electronically Signed   By: Davina Poke D.O.   On: 09/01/2022 16:51    Procedures Procedures    Medications Ordered in ED Medications  sodium chloride 0.9 % bolus 1,000 mL (0 mLs Intravenous Stopped 09/01/22 1724)  ondansetron (ZOFRAN) injection 4 mg (4 mg Intravenous Given 09/01/22 1734)  Ampicillin-Sulbactam (UNASYN) 3 g in sodium chloride 0.9 % 100 mL IVPB (0 g Intravenous Stopped 09/01/22 1814)    ED Course/ Medical Decision Making/ A&P                           Medical Decision Making Amount and/or Complexity of Data Reviewed Labs: ordered. Radiology: ordered.  Risk Prescription drug management.   This patient presents to the ED for concern of abd pain, this involves an extensive number of treatment options, and is a complaint that carries with it a high risk of complications and morbidity.  The differential diagnosis includes viral syn, gastritis, pancreatitis, cholecystitis, uti   Co morbidities that complicate the patient evaluation  colon and breast  cancer, GERD, htn, arthritis, CAD, HLD, pulmonary htn, takotsubo syndrome, CHF, afib (on Eliquis), AS, and CVA   Additional history obtained:  Additional history  obtained from epic chart review External records from outside source obtained and reviewed including family   Lab Tests:  I Ordered, and personally interpreted labs.  The pertinent results include:  cbc with wbc elevated at 16.2, cmp with bun 95 and cr 1.94 (chronic); ua + for uti   Imaging Studies ordered:  I ordered imaging studies including ct abd/pelvis  I independently visualized and interpreted imaging which showed  . No acute abdominopelvic findings. Specifically, no CT evidence of  acute pancreatitis.  2. Air-fluid level within the transverse colon, although no focal  colonic wall thickening or inflammatory changes. Correlate for  diarrheal illness.  3. Hepatomegaly.  4. Aortic atherosclerosis  (ICD10-I70.0).   I agree with the radiologist interpretation   Cardiac Monitoring:  The patient was maintained on a cardiac monitor.  I personally viewed and interpreted the cardiac monitored which showed an underlying rhythm of: nsr   Medicines ordered and prescription drug management:  I ordered medication including ivfs and zofran  for dehydration and nausea and unasyn for cellulitis from cat scratch  Reevaluation of the patient after these medicines showed that the patient improved I have reviewed the patients home medicines and have made adjustments as needed   Test Considered:  ct   Critical Interventions:  Fluids/abx   Problem List / ED Course:  Cellulitis:  pt d/c with augmentin N/v:  improved after meds.  Pt able to tolerate po fluids.  She is d/c with zofran. UTI:  augmentin should cover   Reevaluation:  After the interventions noted above, I reevaluated the patient and found that they have :improved   Social Determinants of Health:  Lives at home   Dispostion:  After  consideration of the diagnostic results and the patients response to treatment, I feel that the patent would benefit from discharge with outpatient f/u.          Final Clinical Impression(s) / ED Diagnoses Final diagnoses:  Cellulitis of right upper extremity  Nausea and vomiting, unspecified vomiting type  Dehydration  Acute cystitis without hematuria    Rx / DC Orders ED Discharge Orders          Ordered    ondansetron (ZOFRAN-ODT) 4 MG disintegrating tablet  Every 8 hours PRN        09/01/22 1733    amoxicillin-clavulanate (AUGMENTIN) 875-125 MG tablet  Every 12 hours        09/01/22 1733              Isla Pence, MD 09/01/22 1826

## 2022-09-01 NOTE — Progress Notes (Signed)
Acute Office Visit  Subjective:     Patient ID: [name], female    DOB: 01/09/1935, 87 y.o.   MRN: [id number]  Chief Complaint  Patient presents with   Acute Visit    dry heaving/nausea    HPI Patient is in today for nausea, dry heaving, diarrhea, chills, and fatigue since yesterday AM, she did go to the ED yesterday and waited 6 hours but left stating she "couldn't wait any longer". She also has a little congestion and does report a recent cat bite to her right arm that is red and swollen. The GI symptoms are improved with Tylenol today but she is still having diarrhea, epigastric and right upper quadrant abdominal pain.  Denies fever  Review of Systems  All other systems reviewed and are negative.  Past Medical History:  Diagnosis Date   Allergy    rhinitis   Aortic stenosis    mild by echo 06/2017   Arthritis    Bradycardia    a. 10/2017 -> beta blocker cut back due to HR 39.   Breast cancer ([place]) 01/06/2012   Cancer ([place])    right colon and left breast   Chronic diastolic CHF (congestive heart failure) ([place])    Colon cancer ([place]) 01/06/2012   Colovesical fistula    Dr. Marlou Starks and [name] planning surgery 08/2018- surgery revealed spontaneous closure   COPD (chronic obstructive pulmonary disease) ([place])    pt. denies   Coronary artery disease 2006   a.  NSTEMI in 2016, cath showed 15% prox-mid RCA, 20% prox LAD, EF 25-35% by cath and 35-40% -> felt due to Takotsubo cardiomyopathy.   Dilated aortic root ([place])    74mHg by echo 06/2017   Diverticulosis    Dyspnea    Edema extremities    GERD (gastroesophageal reflux disease)    Hernia    Hiatal hernia    denies   Hyperlipidemia    Hypertension    Mild aortic stenosis    echo 11/2015 but not noted on echo 06/2016   Osteopenia    Permanent atrial fibrillation (HCC)    chronic atrial fibrillation   Pneumonia    hx child   [place]    a. moderate to severe PASP 665mg echo 11/2015 - now 4142m by echo  06/2017. CTA chest in 11/16 with no PE. PFTs in 7/15 with mild obstructive lung disease. She had a negative sleep study in 2017. b. Felt due to left sided HF.   [facility]  Takotsubo syndrome 07/29/2015   a. EF 35-40% by echo; akinesis of mid-apical anteroseptal and apical myocardium.  EF now normalized on echo 11/2015   Past Surgical History:  Procedure Laterality Date   APPENDECTOMY     BREAST SURGERY     lumpectomy left   CARDIAC CATHETERIZATION     CARDIAC CATHETERIZATION N/A 07/28/2015   Procedure: Left Heart Cath and Coronary Angiography;  Surgeon: Peter M JorMartiniqueD;  Location: [facility];  Service: Cardiovascular;  Laterality: N/A;   CHOLECYSTECTOMY     COLECTOMY     right side   CYSTOSCOPY WITH STENT PLACEMENT Left 09/04/2018   Procedure: CYSTOSCOPY WITH LEFT STENT PLACEMENT, BLADDER REPAIR, CYSTOSCOPY WITH LEFT STENT REMOVAL;  Surgeon: BorRaynelle BringD;  Location: [facility];  Service: Urology;  Laterality: Left;   EXCISION OF ACCESSORY NIPPLE Bilateral 05/30/2013   Procedure: BILATERAL NIPPLE BIOPSY;  Surgeon: PauMerrie RoofD;  [facility];  Service: General;  Laterality: Bilateral;   EYE SURGERY Bilateral 12   cataracts   HYSTEROSCOPY WITH D & C N/A 05/29/2020   Procedure: DILATATION AND CURETTAGE /HYSTEROSCOPY;  Surgeon: Homero Fellers, MD;  Location: [facility] ORS;  Service: Gynecology;  Laterality: N/A;   IR RADIOLOGIST EVAL & MGMT  07/26/2017   LAPAROSCOPIC RIGHT COLECTOMY N/A 09/04/2018   Procedure: LAPAROSCOPIC ASSISTED SIGMOID COLECTOMY WITH REPAIR OF FISTULA TO BLADDER;  Surgeon: Jovita Kussmaul, MD;  Location: [facility];  Service: General;  Laterality: N/A;   RIGHT/LEFT HEART CATH AND CORONARY ANGIOGRAPHY N/A 12/10/2020   Procedure: RIGHT/LEFT HEART CATH AND CORONARY ANGIOGRAPHY;  Surgeon: Larey Dresser, MD;  Location: [facility];  Service: Cardiovascular;  Laterality: N/A;   SPLIT NIGHT STUDY  02/02/2016       Current Outpatient Medications  on File Prior to Visit  Medication Sig Dispense Refill   acetaminophen (TYLENOL) 500 MG tablet Take 1,000 mg by mouth every 6 (six) hours as needed for headache or moderate pain.     amLODipine (NORVASC) 5 MG tablet Take 1 tablet (5 mg total) by mouth daily. 90 tablet 3   apixaban (ELIQUIS) 5 MG TABS tablet Take 1 tablet (5 mg total) by mouth 2 (two) times daily. 60 tablet 6   Azelastine HCl 137 MCG/SPRAY SOLN Place 1 spray into both nostrils 2 (two) times daily as needed (congestion).     Cholecalciferol (VITAMIN D3) 25 MCG (1000 UT) CAPS Take 1,000 Units by mouth once a week.     clobetasol ointment (TEMOVATE) 5.73 % Apply 1 Application topically daily as needed (irritation).     clonazePAM (KLONOPIN) 0.5 MG tablet Take 1 tablet by mouth twice daily as needed for anxiety 60 tablet 0   cloNIDine (CATAPRES) 0.1 MG tablet Take 1 tablet (0.1 mg total) by mouth daily. 90 tablet 3   famotidine (PEPCID) 20 MG tablet Take 20 mg by mouth at bedtime as needed for heartburn.     ferrous sulfate 325 (65 FE) MG tablet Take 1 tablet (325 mg total) by mouth daily with breakfast. 90 tablet 3   fluconazole (DIFLUCAN) 150 MG tablet Take 150 mg by mouth once.     fluticasone (FLONASE) 50 MCG/ACT nasal spray Place 1 spray into both nostrils daily as needed for allergies or rhinitis.     levocetirizine (XYZAL ALLERGY 24HR) 5 MG tablet Take 1 tablet (5 mg total) by mouth every evening. 30 tablet 0   losartan (COZAAR) 100 MG tablet Take 1 tablet by mouth once daily 90 tablet 1   nitroGLYCERIN (NITROSTAT) 0.4 MG SL tablet DISSOLVE ONE TABLET UNDER THE TONGUE EVERY 5 MINUTES AS NEEDED FOR CHEST PAIN.  DO NOT EXCEED A TOTAL OF 3 DOSES IN 15 MINUTES 25 tablet 8   ranolazine (RANEXA) 500 MG 12 hr tablet Take 500 mg by mouth as needed.     spironolactone (ALDACTONE) 25 MG tablet Take 1 tablet by mouth once daily 90 tablet 1   torsemide (DEMADEX) 20 MG tablet Take 4 tablets (80 mg total) by mouth every morning AND 3 tablets  (60 mg total) every evening. 210 tablet 5   No current facility-administered medications on file prior to visit.   Allergies  Allergen Reactions   Contrast Media [Iodinated Contrast Media] Hives and Other (See Comments)    Per pt strong burning sensation starting in chest radiating outward    Clonidine Derivatives Other (See Comments)    Throat dry  Statins Other (See Comments)    "bones hurt"   Sulfa Antibiotics Diarrhea    Tremors    Celebrex [Celecoxib] Rash   Isosorbide Nitrate Itching and Rash   Other Itching and Rash    Plastic and paper tape and heart monitor pads   Tape Itching and Rash    Red Where applied and will spread        Objective:    BP (!) 104/44   Pulse 60   Temp 98.3 F (36.8 C)   Ht '5\' 3"'$  (1.6 m)   Wt 171 lb (77.6 kg)   SpO2 92%   BMI 30.29 kg/m  BP Readings from Last 3 Encounters:  09/01/22 (!) 125/57  09/01/22 (!) 104/44  08/20/22 126/72      Physical Exam Vitals and nursing note reviewed.  Constitutional:      General: She is not in acute distress.    Appearance: Normal appearance. She is normal weight. She is ill-appearing.  HENT:     Head: Normocephalic and atraumatic.  Cardiovascular:     Rate and Rhythm: Normal rate and regular rhythm.     Pulses: Normal pulses.     Heart sounds: Murmur heard.  Pulmonary:     Effort: Pulmonary effort is normal.     Breath sounds: Normal breath sounds.  Abdominal:     General: Bowel sounds are normal. There is no distension.     Palpations: Abdomen is soft.     Tenderness: There is generalized abdominal tenderness. There is guarding.     Comments: Diffuse tenderness to soft palpation in all quadrants  Skin:    General: Skin is warm and dry.  Neurological:     General: No focal deficit present.     Mental Status: She is alert and oriented to person, place, and time. Mental status is at baseline.  Psychiatric:        Mood and Affect: Mood normal.        Behavior: Behavior normal.         Thought Content: Thought content normal.        Judgment: Judgment normal.     No results found for any visits on 09/01/22.      Assessment & Plan:   Problem List Items Addressed This Visit       Other   Abdominal pain - Primary    Patient is here today with 2 days of acute abdominal pain, nausea, vomiting, and diarrhea. Labs from ED yesterday showed elevated WBC 14.9, lipase 165, and bilirubin 1.4. On exam today she has diffuse abdominal tenderness in all 4 quadrants to light palpation and reports the pain being the worst in RUQ and epigastric. Given her presentation, labs, and mild hypotension I have referred her back to the ED for further evaluation.        No orders of the defined types were placed in this encounter.   No follow-ups on file.  Rubie Maid, FNP

## 2022-09-01 NOTE — Assessment & Plan Note (Addendum)
Patient is here today with 2 days of acute abdominal pain, nausea, vomiting, and diarrhea. Labs from ED yesterday showed elevated WBC 14.9, lipase 165, and bilirubin 1.4. On exam today she has diffuse abdominal tenderness in all 4 quadrants to light palpation and reports the pain being the worst in RUQ and epigastric. Given her presentation, labs, and mild hypotension I have referred her back to the ED for further evaluation.

## 2022-09-02 ENCOUNTER — Telehealth: Payer: Self-pay | Admitting: Family Medicine

## 2022-09-02 ENCOUNTER — Telehealth (HOSPITAL_COMMUNITY): Payer: Self-pay | Admitting: Cardiology

## 2022-09-02 NOTE — Telephone Encounter (Signed)
Received call from [facility] with [facility] to follow up on form sent requiring Dr. Samella Parr signature; form for recertification of [place] and plan of care from July. They still haven't received it.   Order # is [id number].  Please advise at [phone].

## 2022-09-02 NOTE — Telephone Encounter (Signed)
Pt called to report she was in the Er and labs were done Scheduled to have labs end of the week Advised labs are not needed 12/29 however with results would repeat labs in 7-10 days  Lab appt scheduled

## 2022-09-03 ENCOUNTER — Telehealth: Payer: Self-pay

## 2022-09-03 NOTE — Patient Outreach (Signed)
  Care Coordination [facility] Note Transition Care Management Follow-up Telephone Call Date of discharge and from where: [facility] ED 09/01/22 How have you been since you were released from the hospital? "I am feeling better today.  The pill for nausea is helping". Any questions or concerns? No  Items Reviewed: Did the pt receive and understand the discharge instructions provided? Yes  Medications obtained and verified? Yes  Other? No  Any new allergies since your discharge? No  Dietary orders reviewed? No Do you have support at home? Yes   Home Care and Equipment/Supplies: Were home health services ordered? no If so, what is the name of the agency? N/a  Has the agency set up a time to come to the patient's home? not applicable Were any new equipment or medical supplies ordered?  No What is the name of the medical supply agency? N/a Were you able to get the supplies/equipment? no Do you have any questions related to the use of the equipment or supplies? No  Functional Questionnaire: (I = Independent and D = Dependent) ADLs: I  Bathing/Dressing- I  Meal Prep- I  Eating- I  Maintaining continence- I  Transferring/Ambulation- I  Managing Meds- I  Follow up appointments reviewed:  PCP Hospital f/u appt confirmed? Yes  Scheduled to see Dr. Dennard Schaumann on 09/07/22 @ 11:00. [place] [facility] f/u appt confirmed? No   Are transportation arrangements needed? No  If their condition worsens, is the pt aware to call PCP or go to the Emergency Dept.? Yes Was the patient provided with contact information for the PCP's office or ED? Yes Was to pt encouraged to call back with questions or concerns? Yes  SDOH assessments and interventions completed:   Yes SDOH Interventions Today    Flowsheet Row Most Recent Value  SDOH Interventions   Food Insecurity Interventions Intervention Not Indicated  Housing Interventions Intervention Not Indicated       Care Coordination  Interventions:  No Care Coordination interventions needed at this time.   Encounter Outcome:  Pt. Visit Completed

## 2022-09-07 ENCOUNTER — Ambulatory Visit: Payer: Medicare Other | Admitting: Family Medicine

## 2022-09-09 ENCOUNTER — Other Ambulatory Visit: Payer: Self-pay | Admitting: Cardiology

## 2022-09-09 ENCOUNTER — Other Ambulatory Visit: Payer: Self-pay | Admitting: Otolaryngology

## 2022-09-09 DIAGNOSIS — H90A32 Mixed conductive and sensorineural hearing loss, unilateral, left ear with restricted hearing on the contralateral side: Secondary | ICD-10-CM | POA: Diagnosis not present

## 2022-09-09 DIAGNOSIS — H9202 Otalgia, left ear: Secondary | ICD-10-CM

## 2022-09-10 ENCOUNTER — Ambulatory Visit (INDEPENDENT_AMBULATORY_CARE_PROVIDER_SITE_OTHER): Payer: Medicare Other | Admitting: Family Medicine

## 2022-09-10 ENCOUNTER — Encounter: Payer: Self-pay | Admitting: Family Medicine

## 2022-09-10 VITALS — BP 132/62 | HR 63 | Ht 63.0 in | Wt 176.6 lb

## 2022-09-10 DIAGNOSIS — R1084 Generalized abdominal pain: Secondary | ICD-10-CM

## 2022-09-10 LAB — URINALYSIS, ROUTINE W REFLEX MICROSCOPIC
Bilirubin Urine: NEGATIVE
Glucose, UA: NEGATIVE
Hgb urine dipstick: NEGATIVE
Ketones, ur: NEGATIVE
Leukocytes,Ua: NEGATIVE
Nitrite: NEGATIVE
Protein, ur: NEGATIVE
Specific Gravity, Urine: 1.01 (ref 1.001–1.035)
pH: 5.5 (ref 5.0–8.0)

## 2022-09-10 NOTE — Progress Notes (Signed)
Subjective:    Patient ID: [name], female    DOB: 19-Nov-1934, 88 y.o.   MRN: [id number] Patient is a very sweet 88 year old Caucasian female with complicated medical history.  She has a history of a right hemicolectomy.  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  She is also had her gallbladder removed.  The day after Christmas, the patient had to go to the emergency room due to nausea, dry heaves, and diarrhea.  She left after lab work with an elevated white blood cell count of 14 as well as a mildly elevated lipase.  She was seen by my partner here and was continue to report abdominal pain, and dry heaves and nausea.  She was sent back to the emergency room where she was diagnosed with cellulitis in the right arm and started on Augmentin.  In the emergency room her white blood cell count was greater than 16.  She also had an elevated lipase although slightly better than previous.  A CT scan was obtained which showed no evidence of pancreatitis but did show an air-fluid level in the colon.  She is here today for follow-up.  The cellulitis in her right arm is improving dramatically.  She denies any further fevers.  She still slightly nauseated but she denies any vomiting or dry heaves.  She has not had any diarrhea.  She reports some mild right-sided abdominal tenderness but no abdominal pain.  Is not distended.  She has slight bowel sounds throughout but no guarding or rebound Past Medical History:  Diagnosis Date   Allergy    rhinitis   Aortic stenosis    mild by echo 06/2017   Arthritis    Bradycardia    a. 10/2017 -> beta blocker cut back due to HR 39.   Breast cancer ([place]) 01/06/2012   Cancer ([place])    right colon and left breast   Chronic diastolic CHF (congestive heart failure) ([place])    Colon cancer ([place]) 01/06/2012   Colovesical fistula    Dr. Marlou Starks and [name] planning surgery 08/2018- surgery revealed spontaneous closure   COPD (chronic obstructive pulmonary disease) ([place])     pt. denies   Coronary artery disease 2006   a.  NSTEMI in 2016, cath showed 15% prox-mid RCA, 20% prox LAD, EF 25-35% by cath and 35-40% -> felt due to Takotsubo cardiomyopathy.   Dilated aortic root ([place])    40mHg by echo 06/2017   Diverticulosis    Dyspnea    Edema extremities    GERD (gastroesophageal reflux disease)    Hernia    Hiatal hernia    denies   Hyperlipidemia    Hypertension    Mild aortic stenosis    echo 11/2015 but not noted on echo 06/2016   Osteopenia    Permanent atrial fibrillation ([facility])    chronic atrial fibrillation   Pneumonia    hx child   Pulmonary HTN ([facility]    a. moderate to severe PASP 631mg echo 11/2015 - now 4149m by echo 06/2017. CTA chest in 11/16 with no PE. PFTs in 7/15 with mild obstructive lung disease. She had a negative sleep study in 2017. b. Felt due to left sided HF.   [facility]  Takotsubo syndrome 07/29/2015   a. EF 35-40% by echo; akinesis of mid-apical anteroseptal and apical myocardium.  EF now normalized on echo 11/2015   Past Surgical History:  Procedure Laterality Date   APPENDECTOMY  BREAST SURGERY     lumpectomy left   CARDIAC CATHETERIZATION     CARDIAC CATHETERIZATION N/A 07/28/2015   Procedure: Left Heart Cath and Coronary Angiography;  Surgeon: Peter M Martinique, MD;  Location: [facility];  Service: Cardiovascular;  Laterality: N/A;   CHOLECYSTECTOMY     COLECTOMY     right side   CYSTOSCOPY WITH STENT PLACEMENT Left 09/04/2018   Procedure: CYSTOSCOPY WITH LEFT STENT PLACEMENT, BLADDER REPAIR, CYSTOSCOPY WITH LEFT STENT REMOVAL;  Surgeon: Raynelle Bring, MD;  Location: [facility];  Service: Urology;  Laterality: Left;   EXCISION OF ACCESSORY NIPPLE Bilateral 05/30/2013   Procedure: BILATERAL NIPPLE BIOPSY;  Surgeon: Merrie Roof, MD;  Location: [facility];  Service: General;  Laterality: Bilateral;   EYE SURGERY Bilateral 12   cataracts   HYSTEROSCOPY WITH D & C N/A 05/29/2020   Procedure: DILATATION AND  CURETTAGE /HYSTEROSCOPY;  Surgeon: Homero Fellers, MD;  Location: [facility];  Service: Gynecology;  Laterality: N/A;   IR RADIOLOGIST EVAL & MGMT  07/26/2017   LAPAROSCOPIC RIGHT COLECTOMY N/A 09/04/2018   Procedure: LAPAROSCOPIC ASSISTED SIGMOID COLECTOMY WITH REPAIR OF FISTULA TO BLADDER;  Surgeon: Jovita Kussmaul, MD;  Location: [facility];  Service: General;  Laterality: N/A;   RIGHT/LEFT HEART CATH AND CORONARY ANGIOGRAPHY N/A 12/10/2020   Procedure: RIGHT/LEFT HEART CATH AND CORONARY ANGIOGRAPHY;  Surgeon: Larey Dresser, MD;  Location: [facility];  Service: Cardiovascular;  Laterality: N/A;   SPLIT NIGHT STUDY  02/02/2016       Current Outpatient Medications on File Prior to Visit  Medication Sig Dispense Refill   acetaminophen (TYLENOL) 500 MG tablet Take 1,000 mg by mouth every 6 (six) hours as needed for headache or moderate pain.     amLODipine (NORVASC) 5 MG tablet Take 1 tablet (5 mg total) by mouth daily. 90 tablet 3   apixaban (ELIQUIS) 5 MG TABS tablet Take 1 tablet (5 mg total) by mouth 2 (two) times daily. 60 tablet 6   Azelastine HCl 137 MCG/SPRAY SOLN Place 1 spray into both nostrils 2 (two) times daily as needed (congestion).     Cholecalciferol (VITAMIN D3) 25 MCG (1000 UT) CAPS Take 1,000 Units by mouth once a week.     clobetasol ointment (TEMOVATE) 4.09 % Apply 1 Application topically daily as needed (irritation).     clonazePAM (KLONOPIN) 0.5 MG tablet Take 1 tablet by mouth twice daily as needed for anxiety 60 tablet 0   cloNIDine (CATAPRES) 0.1 MG tablet Take 1 tablet (0.1 mg total) by mouth daily. 90 tablet 3   famotidine (PEPCID) 20 MG tablet Take 20 mg by mouth at bedtime as needed for heartburn.     ferrous sulfate 325 (65 FE) MG tablet Take 1 tablet (325 mg total) by mouth daily with breakfast. 90 tablet 3   fluconazole (DIFLUCAN) 150 MG tablet Take 150 mg by mouth once.     fluticasone (FLONASE) 50 MCG/ACT nasal spray Place 1 spray into both nostrils  daily as needed for allergies or rhinitis.     levocetirizine (XYZAL ALLERGY 24HR) 5 MG tablet Take 1 tablet (5 mg total) by mouth every evening. 30 tablet 0   losartan (COZAAR) 100 MG tablet Take 1 tablet by mouth once daily 90 tablet 1   methylPREDNISolone (MEDROL DOSEPAK) 4 MG TBPK tablet Take by mouth.     nitroGLYCERIN (NITROSTAT) 0.4 MG SL tablet DISSOLVE ONE TABLET UNDER THE TONGUE EVERY 5 MINUTES AS NEEDED  FOR CHEST PAIN.  DO NOT EXCEED A TOTAL OF 3 DOSES IN 15 MINUTES 25 tablet 8   ondansetron (ZOFRAN-ODT) 4 MG disintegrating tablet Take 1 tablet (4 mg total) by mouth every 8 (eight) hours as needed for nausea or vomiting. 20 tablet 0   ranolazine (RANEXA) 500 MG 12 hr tablet Take 500 mg by mouth as needed.     spironolactone (ALDACTONE) 25 MG tablet Take 1 tablet by mouth once daily 90 tablet 1   torsemide (DEMADEX) 20 MG tablet Take 4 tablets (80 mg total) by mouth every morning AND 3 tablets (60 mg total) every evening. 210 tablet 5   amoxicillin-clavulanate (AUGMENTIN) 875-125 MG tablet Take 1 tablet by mouth every 12 (twelve) hours. (Patient not taking: Reported on 09/10/2022) 14 tablet 0   No current facility-administered medications on file prior to visit.   Allergies  Allergen Reactions   Contrast Media [Iodinated Contrast Media] Hives and Other (See Comments)    Per pt strong burning sensation starting in chest radiating outward    Clonidine Derivatives Other (See Comments)    Throat dry   Statins Other (See Comments)    "bones hurt"   Sulfa Antibiotics Diarrhea    Tremors    Celebrex [Celecoxib] Rash   Isosorbide Nitrate Itching and Rash   Other Itching and Rash    Plastic and paper tape and heart monitor pads   Tape Itching and Rash    Red Where applied and will spread   Social History   Socioeconomic History   Marital status: Widowed    Spouse name: Not on file   Number of children: 1   Years of education: Not on file   Highest education level: Not on file   Occupational History   Not on file  Tobacco Use   Smoking status: Never   Smokeless tobacco: Never  Vaping Use   Vaping Use: Never used  Substance and Sexual Activity   Alcohol use: No   Drug use: No   Sexual activity: Not Currently  Other Topics Concern   Not on file  Social History Narrative   Husband Braulio Conte, passed away in 01/24/22. Pt's son, Sherlon Handing lives with patient and helps with her care.    Social Determinants of Health   Financial Resource Strain: Low Risk  (08/11/2022)   Overall Financial Resource Strain (CARDIA)    Difficulty of Paying Living Expenses: Not hard at all  Food Insecurity: No Food Insecurity (09/03/2022)   Hunger Vital Sign    Worried About Running Out of Food in the Last Year: Never true    Ran Out of Food in the Last Year: Never true  Transportation Needs: No Transportation Needs (08/11/2022)   PRAPARE - Hydrologist (Medical): No    Lack of Transportation (Non-Medical): No  Physical Activity: Insufficiently Active (08/11/2022)   Exercise Vital Sign    Days of Exercise per Week: 5 days    Minutes of Exercise per Session: 10 min  Stress: No Stress Concern Present (08/11/2022)   [place]    Feeling of Stress : Not at all  Social Connections: Moderately Isolated (08/11/2022)   Social Connection and Isolation Panel [NHANES]    Frequency of Communication with Friends and Family: More than three times a week    Frequency of Social Gatherings with Friends and Family: Twice a week    Attends Religious Services: More than 4 times  per year    Active Member of Clubs or Organizations: No    Attends Archivist Meetings: Never    Marital Status: Widowed  Intimate Partner Violence: Not At Risk (08/11/2022)   Humiliation, Afraid, Rape, and Kick questionnaire    Fear of Current or Ex-Partner: No    Emotionally Abused: No    Physically Abused: No     Sexually Abused: No      Review of Systems  Musculoskeletal:  Positive for back pain.  All other systems reviewed and are negative.      Objective:   Physical Exam Vitals reviewed.  Constitutional:      General: She is not in acute distress.    Appearance: Normal appearance. She is well-developed and normal weight. She is not diaphoretic.  HENT:     Left Ear: Ear canal normal.     Nose: No congestion or rhinorrhea.     Mouth/Throat:     Mouth: Mucous membranes are moist.     Pharynx: No oropharyngeal exudate or posterior oropharyngeal erythema.  Eyes:     General:        Right eye: No discharge.        Left eye: No discharge.     Conjunctiva/sclera: Conjunctivae normal.  Neck:     Vascular: No carotid bruit.  Cardiovascular:     Rate and Rhythm: Normal rate and regular rhythm.     Heart sounds: Murmur heard.     No friction rub. No gallop.  Pulmonary:     Effort: Pulmonary effort is normal. No accessory muscle usage, prolonged expiration or respiratory distress.     Breath sounds: Normal breath sounds and air entry. No stridor, decreased air movement or transmitted upper airway sounds. No decreased breath sounds, wheezing, rhonchi or rales.  Chest:     Chest wall: No mass, lacerations, deformity, swelling or crepitus.  Abdominal:     General: Abdomen is flat. Bowel sounds are decreased. There is no distension.     Palpations: Abdomen is not rigid.     Tenderness: There is no abdominal tenderness. There is no guarding or rebound.     Hernia: No hernia is present.  Musculoskeletal:     Cervical back: No rigidity or tenderness.     Right lower leg: Edema present.     Left lower leg: Edema present.  Skin:    Findings: No bruising or lesion.  Neurological:     Mental Status: She is alert. Mental status is at baseline.     Motor: No abnormal muscle tone.     Coordination: Coordination normal.     Gait: Gait normal.     Deep Tendon Reflexes: Reflexes normal.   Psychiatric:        Mood and Affect: Mood normal.        Behavior: Behavior normal.        Thought Content: Thought content normal.        Judgment: Judgment normal.          Assessment & Plan:  Generalized abdominal pain - Plan: Urinalysis, Routine w reflex microscopic, CBC with Differential/Platelet, COMPLETE METABOLIC PANEL WITH GFR Cellulitis in her right arm has resolved.  She simply needs to complete the Augmentin.  My concern is what caused the nausea and dry heaves and diarrhea.  I am concerned the patient Fross have had a partial bowel obstruction due to adhesions from her previous abdominal surgeries.  I believe that this resolved on its own  and is gradually improving.  She Schiffman have also had mild pancreatitis.  I have recommended a bland diet including soup, Jell-O, pudding, and applesauce.  I want her to drink fluid and avoid heavy meals.  I believe that the partial bowel obstruction is gradually resolving and I want her to advance her diet slowly as tolerated.  If she develops uncontrolled vomiting, she will need to return to the hospital for evaluation of a bowel obstruction.  If she continues to have vague intermittent symptoms given elevated lipase, consider pancreatic insufficiency

## 2022-09-11 LAB — COMPLETE METABOLIC PANEL WITH GFR
AG Ratio: 1.4 (calc) (ref 1.0–2.5)
ALT: 9 U/L (ref 6–29)
AST: 10 U/L (ref 10–35)
Albumin: 4.3 g/dL (ref 3.6–5.1)
Alkaline phosphatase (APISO): 80 U/L (ref 37–153)
BUN/Creatinine Ratio: 36 (calc) — ABNORMAL HIGH (ref 6–22)
BUN: 61 mg/dL — ABNORMAL HIGH (ref 7–25)
CO2: 25 mmol/L (ref 20–32)
Calcium: 9.7 mg/dL (ref 8.6–10.4)
Chloride: 89 mmol/L — ABNORMAL LOW (ref 98–110)
Creat: 1.68 mg/dL — ABNORMAL HIGH (ref 0.60–0.95)
Globulin: 3.1 g/dL (calc) (ref 1.9–3.7)
Glucose, Bld: 116 mg/dL — ABNORMAL HIGH (ref 65–99)
Potassium: 5.2 mmol/L (ref 3.5–5.3)
Sodium: 127 mmol/L — ABNORMAL LOW (ref 135–146)
Total Bilirubin: 0.3 mg/dL (ref 0.2–1.2)
Total Protein: 7.4 g/dL (ref 6.1–8.1)
eGFR: 30 mL/min/{1.73_m2} — ABNORMAL LOW (ref >=60)

## 2022-09-11 LAB — CBC WITH DIFFERENTIAL/PLATELET
Absolute Monocytes: 596 cells/uL (ref 200–950)
Basophils Absolute: 8 cells/uL (ref 0–200)
Basophils Relative: 0.1 %
Eosinophils Absolute: 0 cells/uL — ABNORMAL LOW (ref 15–500)
Eosinophils Relative: 0 %
HCT: 31.1 % — ABNORMAL LOW (ref 35.0–45.0)
Hemoglobin: 10.3 g/dL — ABNORMAL LOW (ref 11.7–15.5)
Lymphs Abs: 1109 cells/uL (ref 850–3900)
MCH: 30.3 pg (ref 27.0–33.0)
MCHC: 33.1 g/dL (ref 32.0–36.0)
MCV: 91.5 fL (ref 80.0–100.0)
MPV: 9.5 fL (ref 7.5–12.5)
Monocytes Relative: 7.1 %
Neutro Abs: 6686 cells/uL (ref 1500–7800)
Neutrophils Relative %: 79.6 %
Platelets: 293 10*3/uL (ref 140–400)
RBC: 3.4 10*6/uL — ABNORMAL LOW (ref 3.80–5.10)
RDW: 12.4 % (ref 11.0–15.0)
Total Lymphocyte: 13.2 %
WBC: 8.4 10*3/uL (ref 3.8–10.8)

## 2022-09-13 ENCOUNTER — Ambulatory Visit
Admission: RE | Admit: 2022-09-13 | Discharge: 2022-09-13 | Disposition: A | Discharge status: Home / Self Care | Payer: Medicare Other | Source: Ambulatory Visit | Attending: Otolaryngology | Admitting: Otolaryngology

## 2022-09-13 ENCOUNTER — Telehealth: Payer: Self-pay

## 2022-09-13 ENCOUNTER — Other Ambulatory Visit (HOSPITAL_COMMUNITY): Payer: Medicare Other

## 2022-09-13 DIAGNOSIS — J3489 Other specified disorders of nose and nasal sinuses: Secondary | ICD-10-CM | POA: Diagnosis not present

## 2022-09-13 DIAGNOSIS — I6381 Other cerebral infarction due to occlusion or stenosis of small artery: Secondary | ICD-10-CM | POA: Diagnosis not present

## 2022-09-13 DIAGNOSIS — H9202 Otalgia, left ear: Secondary | ICD-10-CM

## 2022-09-13 DIAGNOSIS — I672 Cerebral atherosclerosis: Secondary | ICD-10-CM | POA: Diagnosis not present

## 2022-09-13 NOTE — Telephone Encounter (Signed)
Pt called in stating that she has been having some issues with constipation. Pt states that she has not moved her bowels in a week and is very uncomfortable. Pt would like to know if dr would send something to the pharmacy for this please. Please advise.  Cb#: [phone]

## 2022-09-14 ENCOUNTER — Ambulatory Visit: Payer: Medicare Other | Admitting: Family Medicine

## 2022-09-16 ENCOUNTER — Other Ambulatory Visit: Payer: Self-pay | Admitting: Cardiology

## 2022-09-17 ENCOUNTER — Other Ambulatory Visit: Payer: Self-pay

## 2022-09-17 MED ORDER — APIXABAN 2.5 MG PO TABS: 2.5000 mg | ORAL_TABLET | Freq: Two times a day (BID) | ORAL | 5 refills | Status: DC

## 2022-09-17 NOTE — Telephone Encounter (Signed)
Prescription refill request for Eliquis received. Indication:afib Last office visit:11/23 Scr:1.6 Age: 88 Weight:80.1  kg   Dose adjustment under review

## 2022-09-17 NOTE — Telephone Encounter (Signed)
Yes, please adjust to 2.'5mg'$  BID

## 2022-09-20 ENCOUNTER — Encounter: Payer: Self-pay | Admitting: Family Medicine

## 2022-09-20 ENCOUNTER — Ambulatory Visit (INDEPENDENT_AMBULATORY_CARE_PROVIDER_SITE_OTHER): Payer: Medicare Other | Admitting: Family Medicine

## 2022-09-20 ENCOUNTER — Other Ambulatory Visit: Payer: Medicare Other

## 2022-09-20 VITALS — BP 130/68 | HR 58 | Ht 63.0 in | Wt 175.4 lb

## 2022-09-20 DIAGNOSIS — K921 Melena: Secondary | ICD-10-CM | POA: Diagnosis not present

## 2022-09-20 DIAGNOSIS — M5441 Lumbago with sciatica, right side: Secondary | ICD-10-CM

## 2022-09-20 DIAGNOSIS — E871 Hypo-osmolality and hyponatremia: Secondary | ICD-10-CM | POA: Diagnosis not present

## 2022-09-20 DIAGNOSIS — M5442 Lumbago with sciatica, left side: Secondary | ICD-10-CM

## 2022-09-20 MED ORDER — PREDNISONE 20 MG PO TABS: ORAL_TABLET | ORAL | 0 refills | Status: DC

## 2022-09-20 NOTE — Progress Notes (Signed)
Subjective:    Patient ID: [name], female    DOB: 01/09/1935, 88 y.o.   MRN: [id number] Patient injured back while mopping her floor.  She reports pain in both flanks.  It is radiating into both posterior hips.  She reports pain radiating down either leg.  She is very tender to palpation over the lumbar paraspinal muscles bilaterally.  She denies any saddle anesthesia or bowel or bladder incontinence.  She denies any dysuria or urgency or frequency.  Last week she was found to have a sodium level of 127.  I asked her to hold her spironolactone and restrict her fluid to less than 40 ounces per day.  She is here today to recheck her sodium level Past Medical History:  Diagnosis Date   Allergy    rhinitis   Aortic stenosis    mild by echo 06/2017   Arthritis    Bradycardia    a. 10/2017 -> beta blocker cut back due to HR 39.   Breast cancer ([place]) 01/06/2012   Cancer ([place])    right colon and left breast   Chronic diastolic CHF (congestive heart failure) ([place])    Colon cancer ([place]) 01/06/2012   Colovesical fistula    Dr. Marlou Starks and [name] planning surgery 08/2018- surgery revealed spontaneous closure   COPD (chronic obstructive pulmonary disease) ([place])    pt. denies   Coronary artery disease 2006   a.  NSTEMI in 2016, cath showed 15% prox-mid RCA, 20% prox LAD, EF 25-35% by cath and 35-40% -> felt due to Takotsubo cardiomyopathy.   Dilated aortic root ([place])    9mHg by echo 06/2017   Diverticulosis    Dyspnea    Edema extremities    GERD (gastroesophageal reflux disease)    Hernia    Hiatal hernia    denies   Hyperlipidemia    Hypertension    Mild aortic stenosis    echo 11/2015 but not noted on echo 06/2016   Osteopenia    Permanent atrial fibrillation (HCC)    chronic atrial fibrillation   Pneumonia    hx child   [place] ([place] [place]    a. moderate to severe PASP 637mg echo 11/2015 - now 4143m by echo 06/2017. CTA chest in 11/16 with no PE. PFTs in 7/15 with mild obstructive  lung disease. She had a negative sleep study in 2017. b. Felt due to left sided HF.   [facility]  Takotsubo syndrome 07/29/2015   a. EF 35-40% by echo; akinesis of mid-apical anteroseptal and apical myocardium.  EF now normalized on echo 11/2015   Past Surgical History:  Procedure Laterality Date   APPENDECTOMY     BREAST SURGERY     lumpectomy left   CARDIAC CATHETERIZATION     CARDIAC CATHETERIZATION N/A 07/28/2015   Procedure: Left Heart Cath and Coronary Angiography;  Surgeon: Peter M JorMartiniqueD;  Location: [facility];  Service: Cardiovascular;  Laterality: N/A;   CHOLECYSTECTOMY     COLECTOMY     right side   CYSTOSCOPY WITH STENT PLACEMENT Left 09/04/2018   Procedure: CYSTOSCOPY WITH LEFT STENT PLACEMENT, BLADDER REPAIR, CYSTOSCOPY WITH LEFT STENT REMOVAL;  Surgeon: BorRaynelle BringD;  [facility];  Service: Urology;  Laterality: Left;   EXCISION OF ACCESSORY NIPPLE Bilateral 05/30/2013   Procedure: BILATERAL NIPPLE BIOPSY;  Surgeon: PauMerrie RoofD;  [facility];  Laterality: Bilateral;   EYE SURGERY Bilateral 12  cataracts   HYSTEROSCOPY WITH D & C N/A 05/29/2020   Procedure: DILATATION AND CURETTAGE /HYSTEROSCOPY;  Surgeon: Homero Fellers, MD;  Location: [facility];  Service: Gynecology;  Laterality: N/A;   IR RADIOLOGIST EVAL & MGMT  07/26/2017   LAPAROSCOPIC RIGHT COLECTOMY N/A 09/04/2018   Procedure: LAPAROSCOPIC ASSISTED SIGMOID COLECTOMY WITH REPAIR OF FISTULA TO BLADDER;  Surgeon: Jovita Kussmaul, MD;  [facility];  Service: General;  Laterality: N/A;   RIGHT/LEFT HEART CATH AND CORONARY ANGIOGRAPHY N/A 12/10/2020   Procedure: RIGHT/LEFT HEART CATH AND CORONARY ANGIOGRAPHY;  Surgeon: Larey Dresser, MD;  Location: [place] [facility];  Service: Cardiovascular;  Laterality: N/A;   SPLIT NIGHT STUDY  02/02/2016       Current Outpatient Medications on File Prior to Visit  Medication Sig Dispense Refill   acetaminophen  (TYLENOL) 500 MG tablet Take 1,000 mg by mouth every 6 (six) hours as needed for headache or moderate pain.     amLODipine (NORVASC) 5 MG tablet Take 1 tablet (5 mg total) by mouth daily. 90 tablet 3   apixaban (ELIQUIS) 2.5 MG TABS tablet Take 1 tablet (2.5 mg total) by mouth 2 (two) times daily. 60 tablet 5   Azelastine HCl 137 MCG/SPRAY SOLN Place 1 spray into both nostrils 2 (two) times daily as needed (congestion).     Cholecalciferol (VITAMIN D3) 25 MCG (1000 UT) CAPS Take 1,000 Units by mouth once a week.     clobetasol ointment (TEMOVATE) 9.70 % Apply 1 Application topically daily as needed (irritation).     clonazePAM (KLONOPIN) 0.5 MG tablet Take 1 tablet by mouth twice daily as needed for anxiety 60 tablet 0   cloNIDine (CATAPRES) 0.1 MG tablet Take 1 tablet (0.1 mg total) by mouth daily. 90 tablet 3   famotidine (PEPCID) 20 MG tablet Take 20 mg by mouth at bedtime as needed for heartburn.     ferrous sulfate 325 (65 FE) MG tablet Take 1 tablet (325 mg total) by mouth daily with breakfast. 90 tablet 3   fluconazole (DIFLUCAN) 150 MG tablet Take 150 mg by mouth once.     fluticasone (FLONASE) 50 MCG/ACT nasal spray Place 1 spray into both nostrils daily as needed for allergies or rhinitis.     levocetirizine (XYZAL ALLERGY 24HR) 5 MG tablet Take 1 tablet (5 mg total) by mouth every evening. 30 tablet 0   losartan (COZAAR) 100 MG tablet Take 1 tablet by mouth once daily 90 tablet 1   methylPREDNISolone (MEDROL DOSEPAK) 4 MG TBPK tablet Take by mouth.     nitroGLYCERIN (NITROSTAT) 0.4 MG SL tablet DISSOLVE ONE TABLET UNDER THE TONGUE EVERY 5 MINUTES AS NEEDED FOR CHEST PAIN.  DO NOT EXCEED A TOTAL OF 3 DOSES IN 15 MINUTES 25 tablet 8   ondansetron (ZOFRAN-ODT) 4 MG disintegrating tablet Take 1 tablet (4 mg total) by mouth every 8 (eight) hours as needed for nausea or vomiting. 20 tablet 0   ranolazine (RANEXA) 500 MG 12 hr tablet Take 500 mg by mouth as needed.     spironolactone  (ALDACTONE) 25 MG tablet Take 1 tablet by mouth once daily 90 tablet 2   torsemide (DEMADEX) 20 MG tablet Take 4 tablets (80 mg total) by mouth every morning AND 3 tablets (60 mg total) every evening. 210 tablet 5   No current facility-administered medications on file prior to visit.   Allergies  Allergen Reactions   Contrast Media [Iodinated Contrast Media] Hives and Other (See  Comments)    Per pt strong burning sensation starting in chest radiating outward    Clonidine Derivatives Other (See Comments)    Throat dry   Statins Other (See Comments)    "bones hurt"   Sulfa Antibiotics Diarrhea    Tremors    Celebrex [Celecoxib] Rash   Isosorbide Nitrate Itching and Rash   Other Itching and Rash    Plastic and paper tape and heart monitor pads   Tape Itching and Rash    Red Where applied and will spread   Social History   Socioeconomic History   Marital status: Widowed    Spouse name: Not on file   Number of children: 1   Years of education: Not on file   Highest education level: Not on file  Occupational History   Not on file  Tobacco Use   Smoking status: Never   Smokeless tobacco: Never  Vaping Use   Vaping Use: Never used  Substance and Sexual Activity   Alcohol use: No   Drug use: No   Sexual activity: Not Currently  Other Topics Concern   Not on file  Social History Narrative   Husband Braulio Conte, passed away in 02-14-22. Pt's son, Sherlon Handing lives with patient and helps with her care.    Social Determinants of Health   Financial Resource Strain: Low Risk  (08/11/2022)   Overall Financial Resource Strain (CARDIA)    Difficulty of Paying Living Expenses: Not hard at all  Food Insecurity: No Food Insecurity (09/03/2022)   Hunger Vital Sign    Worried About Running Out of Food in the Last Year: Never true    Ran Out of Food in the Last Year: Never true  Transportation Needs: No Transportation Needs (08/11/2022)   PRAPARE - Radiographer, therapeutic (Medical): No    Lack of Transportation (Non-Medical): No  Physical Activity: Insufficiently Active (08/11/2022)   Exercise Vital Sign    Days of Exercise per Week: 5 days    Minutes of Exercise per Session: 10 min  Stress: No Stress Concern Present (08/11/2022)   [place]    Feeling of Stress : Not at all  Social Connections: Moderately Isolated (08/11/2022)   Social Connection and Isolation Panel [NHANES]    Frequency of Communication with Friends and Family: More than three times a week    Frequency of Social Gatherings with Friends and Family: Twice a week    Attends Religious Services: More than 4 times per year    Active Member of [employer] or Organizations: No    Attends Archivist Meetings: Never    Marital Status: Widowed  Intimate Partner Violence: Not At Risk (08/11/2022)   Humiliation, Afraid, Rape, and Kick questionnaire    Fear of Current or Ex-Partner: No    Emotionally Abused: No    Physically Abused: No    Sexually Abused: No      Review of Systems  Musculoskeletal:  Positive for back pain.  All other systems reviewed and are negative.      Objective:   Physical Exam Vitals reviewed.  Constitutional:      General: She is not in acute distress.    Appearance: Normal appearance. She is well-developed and normal weight. She is not diaphoretic.  Cardiovascular:     Rate and Rhythm: Normal rate and regular rhythm.     Heart sounds: Murmur heard.     No friction  rub. No gallop.  Pulmonary:     Effort: Pulmonary effort is normal. No accessory muscle usage, prolonged expiration or respiratory distress.     Breath sounds: Normal breath sounds and air entry. No stridor, decreased air movement or transmitted upper airway sounds. No decreased breath sounds, wheezing, rhonchi or rales.  Chest:     Chest wall: No mass, lacerations, deformity, swelling or crepitus.  Abdominal:      General: Abdomen is flat. Bowel sounds are decreased. There is no distension.     Palpations: Abdomen is not rigid.     Tenderness: There is no abdominal tenderness. There is no guarding or rebound.     Hernia: No hernia is present.  Musculoskeletal:     Lumbar back: Spasms and tenderness present. No bony tenderness. Decreased range of motion. Negative right straight leg raise test and negative left straight leg raise test.  Skin:    Findings: No bruising or lesion.  Neurological:     Mental Status: She is alert. Mental status is at baseline.     Motor: No abnormal muscle tone.     Coordination: Coordination normal.     Gait: Gait normal.     Deep Tendon Reflexes: Reflexes normal.          Assessment & Plan:  Acute bilateral low back pain with bilateral sciatica - Plan: CBC with Differential/Platelet, BASIC METABOLIC PANEL WITH GFR  Hyponatremia Her exam suggest muscle spasms.  I want to avoid muscle relaxers in this 88 year old female.  However she also has symptoms that are concerning for a herniated disc.  Begin a prednisone taper pack and reassess in 1 week.  Meanwhile check CBC to look for any leukocytosis and check a BMP to monitor her sodium level.  If sodium level has corrected we will continue to restrict the patient's fluid less than 40 ounces a day and hold spironolactone

## 2022-09-21 LAB — BASIC METABOLIC PANEL WITH GFR
BUN/Creatinine Ratio: 52 (calc) — ABNORMAL HIGH (ref 6–22)
BUN: 61 mg/dL — ABNORMAL HIGH (ref 7–25)
CO2: 26 mmol/L (ref 20–32)
Calcium: 9.4 mg/dL (ref 8.6–10.4)
Chloride: 90 mmol/L — ABNORMAL LOW (ref 98–110)
Creat: 1.18 mg/dL — ABNORMAL HIGH (ref 0.60–0.95)
Glucose, Bld: 103 mg/dL — ABNORMAL HIGH (ref 65–99)
Potassium: 4.7 mmol/L (ref 3.5–5.3)
Sodium: 124 mmol/L — ABNORMAL LOW (ref 135–146)
eGFR: 45 mL/min/{1.73_m2} — ABNORMAL LOW (ref >=60)

## 2022-09-21 LAB — CBC WITH DIFFERENTIAL/PLATELET
Absolute Monocytes: 896 cells/uL (ref 200–950)
Basophils Absolute: 22 cells/uL (ref 0–200)
Basophils Relative: 0.4 %
Eosinophils Absolute: 62 cells/uL (ref 15–500)
Eosinophils Relative: 1.1 %
HCT: 30.4 % — ABNORMAL LOW (ref 35.0–45.0)
Hemoglobin: 10.3 g/dL — ABNORMAL LOW (ref 11.7–15.5)
Lymphs Abs: 1400 cells/uL (ref 850–3900)
MCH: 30.9 pg (ref 27.0–33.0)
MCHC: 33.9 g/dL (ref 32.0–36.0)
MCV: 91.3 fL (ref 80.0–100.0)
MPV: 9.9 fL (ref 7.5–12.5)
Monocytes Relative: 16 %
Neutro Abs: 3220 cells/uL (ref 1500–7800)
Neutrophils Relative %: 57.5 %
Platelets: 262 10*3/uL (ref 140–400)
RBC: 3.33 10*6/uL — ABNORMAL LOW (ref 3.80–5.10)
RDW: 12.7 % (ref 11.0–15.0)
Total Lymphocyte: 25 %
WBC: 5.6 10*3/uL (ref 3.8–10.8)

## 2022-09-23 ENCOUNTER — Other Ambulatory Visit: Payer: Self-pay | Admitting: Cardiology

## 2022-09-24 DIAGNOSIS — M545 Low back pain, unspecified: Secondary | ICD-10-CM | POA: Diagnosis not present

## 2022-09-24 NOTE — Telephone Encounter (Signed)
This is a CHF pt 

## 2022-09-27 DIAGNOSIS — Z8673 Personal history of transient ischemic attack (TIA), and cerebral infarction without residual deficits: Secondary | ICD-10-CM | POA: Diagnosis not present

## 2022-09-27 DIAGNOSIS — M545 Low back pain, unspecified: Secondary | ICD-10-CM | POA: Diagnosis not present

## 2022-09-27 DIAGNOSIS — Z8679 Personal history of other diseases of the circulatory system: Secondary | ICD-10-CM | POA: Diagnosis not present

## 2022-09-27 DIAGNOSIS — R29898 Other symptoms and signs involving the musculoskeletal system: Secondary | ICD-10-CM | POA: Diagnosis not present

## 2022-09-28 ENCOUNTER — Other Ambulatory Visit: Payer: Medicare Other

## 2022-09-28 DIAGNOSIS — E871 Hypo-osmolality and hyponatremia: Secondary | ICD-10-CM

## 2022-09-28 DIAGNOSIS — E875 Hyperkalemia: Secondary | ICD-10-CM | POA: Diagnosis not present

## 2022-09-28 LAB — CBC WITH DIFFERENTIAL/PLATELET
Absolute Monocytes: 763 cells/uL (ref 200–950)
Basophils Absolute: 18 cells/uL (ref 0–200)
Basophils Relative: 0.3 %
Eosinophils Absolute: 92 cells/uL (ref 15–500)
Eosinophils Relative: 1.5 %
HCT: 32.7 % — ABNORMAL LOW (ref 35.0–45.0)
Hemoglobin: 10.7 g/dL — ABNORMAL LOW (ref 11.7–15.5)
Lymphs Abs: 1842 cells/uL (ref 850–3900)
MCH: 30.8 pg (ref 27.0–33.0)
MCHC: 32.7 g/dL (ref 32.0–36.0)
MCV: 94.2 fL (ref 80.0–100.0)
MPV: 9.3 fL (ref 7.5–12.5)
Monocytes Relative: 12.5 %
Neutro Abs: 3386 cells/uL (ref 1500–7800)
Neutrophils Relative %: 55.5 %
Platelets: 247 10*3/uL (ref 140–400)
RBC: 3.47 10*6/uL — ABNORMAL LOW (ref 3.80–5.10)
RDW: 12.9 % (ref 11.0–15.0)
Total Lymphocyte: 30.2 %
WBC: 6.1 10*3/uL (ref 3.8–10.8)

## 2022-09-29 ENCOUNTER — Ambulatory Visit: Payer: Medicare Other | Admitting: Podiatry

## 2022-09-29 ENCOUNTER — Encounter: Payer: Self-pay | Admitting: Podiatry

## 2022-09-29 DIAGNOSIS — B351 Tinea unguium: Secondary | ICD-10-CM | POA: Diagnosis not present

## 2022-09-29 DIAGNOSIS — D689 Coagulation defect, unspecified: Secondary | ICD-10-CM

## 2022-09-29 DIAGNOSIS — M79676 Pain in unspecified toe(s): Secondary | ICD-10-CM

## 2022-09-29 NOTE — Progress Notes (Signed)
Presents today chief complaint of painful elongated toenails.  Objective: Toenails are long thick yellow dystrophic onychomycotic pulses are palpable.  No open lesions or wounds.  Assessment: Pain in limb secondary onychomycosis.  Plan: Debridement of nails bilateral.

## 2022-09-30 ENCOUNTER — Other Ambulatory Visit: Payer: Medicare Other

## 2022-09-30 DIAGNOSIS — I5032 Chronic diastolic (congestive) heart failure: Secondary | ICD-10-CM

## 2022-09-30 DIAGNOSIS — I509 Heart failure, unspecified: Secondary | ICD-10-CM | POA: Diagnosis not present

## 2022-09-30 DIAGNOSIS — Z7901 Long term (current) use of anticoagulants: Secondary | ICD-10-CM | POA: Diagnosis not present

## 2022-09-30 DIAGNOSIS — G8929 Other chronic pain: Secondary | ICD-10-CM | POA: Diagnosis not present

## 2022-09-30 DIAGNOSIS — Z9181 History of falling: Secondary | ICD-10-CM | POA: Diagnosis not present

## 2022-09-30 DIAGNOSIS — K219 Gastro-esophageal reflux disease without esophagitis: Secondary | ICD-10-CM | POA: Diagnosis not present

## 2022-09-30 DIAGNOSIS — E871 Hypo-osmolality and hyponatremia: Secondary | ICD-10-CM | POA: Diagnosis not present

## 2022-09-30 DIAGNOSIS — E785 Hyperlipidemia, unspecified: Secondary | ICD-10-CM | POA: Diagnosis not present

## 2022-09-30 DIAGNOSIS — I4891 Unspecified atrial fibrillation: Secondary | ICD-10-CM | POA: Diagnosis not present

## 2022-09-30 DIAGNOSIS — Z8673 Personal history of transient ischemic attack (TIA), and cerebral infarction without residual deficits: Secondary | ICD-10-CM | POA: Diagnosis not present

## 2022-09-30 DIAGNOSIS — C50919 Malignant neoplasm of unspecified site of unspecified female breast: Secondary | ICD-10-CM | POA: Diagnosis not present

## 2022-09-30 DIAGNOSIS — M545 Low back pain, unspecified: Secondary | ICD-10-CM | POA: Diagnosis not present

## 2022-09-30 DIAGNOSIS — Z6831 Body mass index (BMI) 31.0-31.9, adult: Secondary | ICD-10-CM | POA: Diagnosis not present

## 2022-09-30 DIAGNOSIS — I110 Hypertensive heart disease with heart failure: Secondary | ICD-10-CM | POA: Diagnosis not present

## 2022-09-30 DIAGNOSIS — E669 Obesity, unspecified: Secondary | ICD-10-CM | POA: Diagnosis not present

## 2022-10-01 ENCOUNTER — Telehealth: Payer: Self-pay

## 2022-10-01 LAB — BASIC METABOLIC PANEL
BUN/Creatinine Ratio: 45 (calc) — ABNORMAL HIGH (ref 6–22)
BUN: 63 mg/dL — ABNORMAL HIGH (ref 7–25)
CO2: 25 mmol/L (ref 20–32)
Calcium: 8.7 mg/dL (ref 8.6–10.4)
Chloride: 99 mmol/L (ref 98–110)
Creat: 1.41 mg/dL — ABNORMAL HIGH (ref 0.60–0.95)
Glucose, Bld: 99 mg/dL (ref 65–99)
Potassium: 4.6 mmol/L (ref 3.5–5.3)
Sodium: 136 mmol/L (ref 135–146)

## 2022-10-01 NOTE — Telephone Encounter (Signed)
Pt called and states she is still having issues with L side pain. Pt asks if she could have a CT ordered since she has a hx of cancer? Thank you.

## 2022-10-04 DIAGNOSIS — M545 Low back pain, unspecified: Secondary | ICD-10-CM | POA: Diagnosis not present

## 2022-10-04 DIAGNOSIS — E785 Hyperlipidemia, unspecified: Secondary | ICD-10-CM | POA: Diagnosis not present

## 2022-10-04 DIAGNOSIS — Z7901 Long term (current) use of anticoagulants: Secondary | ICD-10-CM | POA: Diagnosis not present

## 2022-10-04 DIAGNOSIS — G8929 Other chronic pain: Secondary | ICD-10-CM | POA: Diagnosis not present

## 2022-10-04 DIAGNOSIS — Z6831 Body mass index (BMI) 31.0-31.9, adult: Secondary | ICD-10-CM | POA: Diagnosis not present

## 2022-10-04 DIAGNOSIS — E669 Obesity, unspecified: Secondary | ICD-10-CM | POA: Diagnosis not present

## 2022-10-04 DIAGNOSIS — Z8673 Personal history of transient ischemic attack (TIA), and cerebral infarction without residual deficits: Secondary | ICD-10-CM | POA: Diagnosis not present

## 2022-10-04 DIAGNOSIS — I509 Heart failure, unspecified: Secondary | ICD-10-CM | POA: Diagnosis not present

## 2022-10-04 DIAGNOSIS — M7062 Trochanteric bursitis, left hip: Secondary | ICD-10-CM | POA: Diagnosis not present

## 2022-10-04 DIAGNOSIS — K219 Gastro-esophageal reflux disease without esophagitis: Secondary | ICD-10-CM | POA: Diagnosis not present

## 2022-10-04 DIAGNOSIS — I110 Hypertensive heart disease with heart failure: Secondary | ICD-10-CM | POA: Diagnosis not present

## 2022-10-04 DIAGNOSIS — Z9181 History of falling: Secondary | ICD-10-CM | POA: Diagnosis not present

## 2022-10-04 DIAGNOSIS — C50919 Malignant neoplasm of unspecified site of unspecified female breast: Secondary | ICD-10-CM | POA: Diagnosis not present

## 2022-10-04 DIAGNOSIS — I4891 Unspecified atrial fibrillation: Secondary | ICD-10-CM | POA: Diagnosis not present

## 2022-10-05 ENCOUNTER — Ambulatory Visit (INDEPENDENT_AMBULATORY_CARE_PROVIDER_SITE_OTHER): Payer: Medicare Other | Admitting: Family Medicine

## 2022-10-05 ENCOUNTER — Encounter: Payer: Self-pay | Admitting: Family Medicine

## 2022-10-05 VITALS — BP 148/72 | HR 58 | Temp 98.4°F | Ht 63.0 in | Wt 179.0 lb

## 2022-10-05 DIAGNOSIS — R1032 Left lower quadrant pain: Secondary | ICD-10-CM | POA: Diagnosis not present

## 2022-10-05 MED ORDER — AMOXICILLIN-POT CLAVULANATE 875-125 MG PO TABS: 1.0000 | ORAL_TABLET | Freq: Two times a day (BID) | ORAL | 0 refills | Status: DC

## 2022-10-05 NOTE — Progress Notes (Signed)
Subjective:    Patient ID: [name], female    DOB: 07/02/1935, 88 y.o.   MRN: [id number] Patient is a very sweet 88 year old Caucasian female with complicated medical history.  She has a history of a right hemicolectomy.  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  She is also had her gallbladder removed.  Patient presents today complaining of left lower quadrant abdominal pain.  She had a CT scan of the abdomen and pelvis in late December 2023 that showed an air-fluid level in the transverse colon concerning for diarrheal illness.  The patient states that she has had significant pain in her left lower quadrant ever since that time.  She states that last week she was having severe diarrhea.  She denies any fevers or chills.  She does report some nausea.  She denies any hematochezia.  She denies any constipation.  She denies any dysuria or urgency or frequency Past Medical History:  Diagnosis Date   Allergy    rhinitis   Aortic stenosis    mild by echo 06/2017   Arthritis    Bradycardia    a. 10/2017 -> beta blocker cut back due to HR 39.   Breast cancer ([place]) 01/06/2012   Cancer ([place])    right colon and left breast   Chronic diastolic CHF (congestive heart failure) ([place])    Colon cancer ([place]) 01/06/2012   Colovesical fistula    Dr. Marlou Starks and [name] planning surgery 08/2018- surgery revealed spontaneous closure   COPD (chronic obstructive pulmonary disease) ([place])    pt. denies   Coronary artery disease 2006   a.  NSTEMI in 2016, cath showed 15% prox-mid RCA, 20% prox LAD, EF 25-35% by cath and 35-40% -> felt due to Takotsubo cardiomyopathy.   Dilated aortic root ([place])    35mHg by echo 06/2017   Diverticulosis    Dyspnea    Edema extremities    GERD (gastroesophageal reflux disease)    Hernia    Hiatal hernia    denies   Hyperlipidemia    Hypertension    Mild aortic stenosis    echo 11/2015 but not noted on echo 06/2016   Osteopenia    Permanent atrial fibrillation  (HCC)    chronic atrial fibrillation   Pneumonia    hx child   [place]ulmonary HTN ([place]    a. moderate to severe PASP 61mg echo 11/2015 - now 4178m by echo 06/2017. CTA chest in 11/16 with no PE. PFTs in 7/15 with mild obstructive lung disease. She had a negative sleep study in 2017. b. Felt due to left sided HF.   [facility]  Takotsubo syndrome 07/29/2015   a. EF 35-40% by echo; akinesis of mid-apical anteroseptal and apical myocardium.  EF now normalized on echo 11/2015   Past Surgical History:  Procedure Laterality Date   APPENDECTOMY     BREAST SURGERY     lumpectomy left   CARDIAC CATHETERIZATION     CARDIAC CATHETERIZATION N/A 07/28/2015   Procedure: Left Heart Cath and Coronary Angiography;  Surgeon: Peter M JorMartiniqueD;  [facility];  Service: Cardiovascular;  Laterality: N/A;   CHOLECYSTECTOMY     COLECTOMY     right side   CYSTOSCOPY WITH STENT PLACEMENT Left 09/04/2018   Procedure: CYSTOSCOPY WITH LEFT STENT PLACEMENT, BLADDER REPAIR, CYSTOSCOPY WITH LEFT STENT REMOVAL;  Surgeon: BorRaynelle BringD;  [facility];  Service: Urology;  Laterality: Left;  EXCISION OF ACCESSORY NIPPLE Bilateral 05/30/2013   Procedure: BILATERAL NIPPLE BIOPSY;  Surgeon: Merrie Roof, MD;  Location: [place];  Service: General;  Laterality: Bilateral;   EYE SURGERY Bilateral 12   cataracts   HYSTEROSCOPY WITH D & C N/A 05/29/2020   Procedure: DILATATION AND CURETTAGE /HYSTEROSCOPY;  Surgeon: Homero Fellers, MD;  Location: [facility];  Service: Gynecology;  Laterality: N/A;   IR RADIOLOGIST EVAL & MGMT  07/26/2017   LAPAROSCOPIC RIGHT COLECTOMY N/A 09/04/2018   Procedure: LAPAROSCOPIC ASSISTED SIGMOID COLECTOMY WITH REPAIR OF FISTULA TO BLADDER;  Surgeon: Jovita Kussmaul, MD;  [facility];  Service: General;  Laterality: N/A;   RIGHT/LEFT HEART CATH AND CORONARY ANGIOGRAPHY N/A 12/10/2020   Procedure: RIGHT/LEFT HEART CATH AND CORONARY ANGIOGRAPHY;  Surgeon: Larey Dresser, MD;  Location: [place] [facility];  Service: Cardiovascular;  Laterality: N/A;   SPLIT NIGHT STUDY  02/02/2016       Current Outpatient Medications on File Prior to Visit  Medication Sig Dispense Refill   acetaminophen (TYLENOL) 500 MG tablet Take 1,000 mg by mouth every 6 (six) hours as needed for headache or moderate pain.     amLODipine (NORVASC) 5 MG tablet Take 1 tablet (5 mg total) by mouth daily. 90 tablet 3   apixaban (ELIQUIS) 2.5 MG TABS tablet Take 1 tablet (2.5 mg total) by mouth 2 (two) times daily. 60 tablet 5   Azelastine HCl 137 MCG/SPRAY SOLN Place 1 spray into both nostrils 2 (two) times daily as needed (congestion).     Cholecalciferol (VITAMIN D3) 25 MCG (1000 UT) CAPS Take 1,000 Units by mouth once a week.     clobetasol ointment (TEMOVATE) 1.66 % Apply 1 Application topically daily as needed (irritation).     clonazePAM (KLONOPIN) 0.5 MG tablet Take 1 tablet by mouth twice daily as needed for anxiety 60 tablet 0   cloNIDine (CATAPRES) 0.1 MG tablet Take 1 tablet (0.1 mg total) by mouth daily. 90 tablet 3   famotidine (PEPCID) 20 MG tablet Take 20 mg by mouth at bedtime as needed for heartburn.     ferrous sulfate 325 (65 FE) MG tablet Take 1 tablet (325 mg total) by mouth daily with breakfast. 90 tablet 3   fluconazole (DIFLUCAN) 150 MG tablet Take 150 mg by mouth once.     fluticasone (FLONASE) 50 MCG/ACT nasal spray Place 1 spray into both nostrils daily as needed for allergies or rhinitis.     levocetirizine (XYZAL ALLERGY 24HR) 5 MG tablet Take 1 tablet (5 mg total) by mouth every evening. 30 tablet 0   losartan (COZAAR) 100 MG tablet Take 1 tablet by mouth once daily 90 tablet 0   methylPREDNISolone (MEDROL DOSEPAK) 4 MG TBPK tablet Take by mouth.     nitroGLYCERIN (NITROSTAT) 0.4 MG SL tablet DISSOLVE ONE TABLET UNDER THE TONGUE EVERY 5 MINUTES AS NEEDED FOR CHEST PAIN.  DO NOT EXCEED A TOTAL OF 3 DOSES IN 15 MINUTES 25 tablet 8   ondansetron (ZOFRAN-ODT) 4  MG disintegrating tablet Take 1 tablet (4 mg total) by mouth every 8 (eight) hours as needed for nausea or vomiting. 20 tablet 0   predniSONE (DELTASONE) 20 MG tablet 3 tabs poqday 1-2, 2 tabs poqday 3-4, 1 tab poqday 5-6 12 tablet 0   ranolazine (RANEXA) 500 MG 12 hr tablet Take 500 mg by mouth as needed.     spironolactone (ALDACTONE) 25 MG tablet Take 1 tablet by mouth once daily  90 tablet 2   torsemide (DEMADEX) 20 MG tablet Take 4 tablets (80 mg total) by mouth every morning AND 3 tablets (60 mg total) every evening. 210 tablet 5   No current facility-administered medications on file prior to visit.   Allergies  Allergen Reactions   Contrast Media [Iodinated Contrast Media] Hives and Other (See Comments)    Per pt strong burning sensation starting in chest radiating outward    Clonidine Derivatives Other (See Comments)    Throat dry   Statins Other (See Comments)    "bones hurt"   Sulfa Antibiotics Diarrhea    Tremors    Celebrex [Celecoxib] Rash   Isosorbide Nitrate Itching and Rash   Other Itching and Rash    Plastic and paper tape and heart monitor pads   Tape Itching and Rash    Red Where applied and will spread   Social History   Socioeconomic History   Marital status: Widowed    Spouse name: Not on file   Number of children: 1   Years of education: Not on file   Highest education level: Not on file  Occupational History   Not on file  Tobacco Use   Smoking status: Never   Smokeless tobacco: Never  Vaping Use   Vaping Use: Never used  Substance and Sexual Activity   Alcohol use: No   Drug use: No   Sexual activity: Not Currently  Other Topics Concern   Not on file  Social History Narrative   Husband Braulio Conte, passed away in [place] 22, 2023. Pt's son, Sherlon Handing lives with patient and helps with her care.    Social Determinants of Health   Financial Resource Strain: Low Risk  (08/11/2022)   Overall Financial Resource Strain (CARDIA)    Difficulty of Paying  Living Expenses: Not hard at all  Food Insecurity: No Food Insecurity (09/03/2022)   Hunger Vital Sign    Worried About Running Out of Food in the Last Year: Never true    Ran Out of Food in the Last Year: Never true  Transportation Needs: No Transportation Needs (08/11/2022)   PRAPARE - Hydrologist (Medical): No    Lack of Transportation (Non-Medical): No  Physical Activity: Insufficiently Active (08/11/2022)   Exercise Vital Sign    Days of Exercise per Week: 5 days    Minutes of Exercise per Session: 10 min  Stress: No Stress Concern Present (08/11/2022)   [place]    Feeling of Stress : Not at all  Social Connections: Moderately Isolated (08/11/2022)   Social Connection and Isolation Panel [NHANES]    Frequency of Communication with Friends and Family: More than three times a week    Frequency of Social Gatherings with Friends and Family: Twice a week    Attends Religious Services: More than 4 times per year    Active Member of [employer] or Organizations: No    Attends Archivist Meetings: Never    Marital Status: Widowed  Intimate Partner Violence: Not At Risk (08/11/2022)   Humiliation, Afraid, Rape, and Kick questionnaire    Fear of Current or Ex-Partner: No    Emotionally Abused: No    Physically Abused: No    Sexually Abused: No      Review of Systems  Musculoskeletal:  Positive for back pain.  All other systems reviewed and are negative.      Objective:   Physical Exam Vitals  reviewed.  Constitutional:      General: She is not in acute distress.    Appearance: Normal appearance. She is well-developed and normal weight. She is not diaphoretic.  HENT:     Left Ear: Ear canal normal.     Nose: No congestion or rhinorrhea.     Mouth/Throat:     Mouth: Mucous membranes are moist.     Pharynx: No oropharyngeal exudate or posterior oropharyngeal erythema.  Eyes:      General:        Right eye: No discharge.        Left eye: No discharge.     Conjunctiva/sclera: Conjunctivae normal.  Neck:     Vascular: No carotid bruit.  Cardiovascular:     Rate and Rhythm: Normal rate and regular rhythm.     Heart sounds: Murmur heard.     No friction rub. No gallop.  Pulmonary:     Effort: Pulmonary effort is normal. No accessory muscle usage, prolonged expiration or respiratory distress.     Breath sounds: Normal breath sounds and air entry. No stridor, decreased air movement or transmitted upper airway sounds. No decreased breath sounds, wheezing, rhonchi or rales.  Chest:     Chest wall: No mass, lacerations, deformity, swelling or crepitus.  Abdominal:     General: Abdomen is flat. Bowel sounds are decreased. There is no distension.     Palpations: Abdomen is not rigid.     Tenderness: There is abdominal tenderness in the left lower quadrant. There is no guarding or rebound.     Hernia: No hernia is present.  Musculoskeletal:     Cervical back: No rigidity or tenderness.     Right lower leg: Edema present.     Left lower leg: Edema present.  Skin:    Findings: No bruising or lesion.  Neurological:     Mental Status: She is alert. Mental status is at baseline.     Motor: No abnormal muscle tone.     Coordination: Coordination normal.     Gait: Gait normal.     Deep Tendon Reflexes: Reflexes normal.  Psychiatric:        Mood and Affect: Mood normal.        Behavior: Behavior normal.        Thought Content: Thought content normal.        Judgment: Judgment normal.          Assessment & Plan:  LLQ abdominal pain I am concerned the patient [place] have colitis or diverticulitis.  Begin Augmentin 875 mg twice daily for 10 days.  Reassess in 48 hours if no better or sooner if worse.

## 2022-10-06 ENCOUNTER — Telehealth (HOSPITAL_COMMUNITY): Payer: Self-pay

## 2022-10-07 ENCOUNTER — Other Ambulatory Visit: Payer: Self-pay | Admitting: Family Medicine

## 2022-10-07 ENCOUNTER — Other Ambulatory Visit: Payer: Self-pay | Admitting: Cardiology

## 2022-10-07 DIAGNOSIS — F064 Anxiety disorder due to known physiological condition: Secondary | ICD-10-CM

## 2022-10-07 DIAGNOSIS — I4821 Permanent atrial fibrillation: Secondary | ICD-10-CM

## 2022-10-07 DIAGNOSIS — K5792 Diverticulitis of intestine, part unspecified, without perforation or abscess without bleeding: Secondary | ICD-10-CM

## 2022-10-08 NOTE — Telephone Encounter (Signed)
Eliquis '5mg'$  refill request received. Patient is 88 years old, weight-81.2kg, Crea-1.41 on 09/30/22, Diagnosis-Afib, and last seen by Allena Katz on 08/09/2022. Dose is appropriate based on dosing criteria. Will send in refill to requested pharmacy.

## 2022-10-08 NOTE — Telephone Encounter (Signed)
Requested medications are due for refill today.  Provider to determine  Requested medications are on the active medications list.  yes  Last refill. 08/20/2022 #60 0 rf  Future visit scheduled.   no  Notes to clinic.  Refill not delegated.    Requested Prescriptions  Pending Prescriptions Disp Refills   clonazePAM (KLONOPIN) 0.5 MG tablet [Pharmacy Med Name: clonazePAM 0.5 MG Oral Tablet] 60 tablet 0    Sig: Take 1 tablet by mouth twice daily as needed for anxiety     Not Delegated - Psychiatry: Anxiolytics/Hypnotics 2 Failed - 10/07/2022  6:53 PM      Failed - This refill cannot be delegated      Failed - Urine Drug Screen completed in last 360 days      Failed - Valid encounter within last 6 months    Recent Outpatient Visits           9 months ago Chronic diastolic CHF (congestive heart failure) ([place])   [place] [name], Cammie Mcgee, MD   1 year ago Dyspnea, unspecified type   [place] [name], MD   1 year ago Dyspnea, unspecified type   [place] [name], MD   1 year ago Chest wall pain   [place] Dennard Schaumann, Cammie Mcgee, MD   1 year ago Anemia, unspecified type   [place], Cammie Mcgee, MD       Future Appointments             In 2 weeks [name], Eber Hong, MD [place] at [facility], [place] - Patient is not pregnant

## 2022-10-11 ENCOUNTER — Other Ambulatory Visit: Payer: Self-pay | Admitting: Cardiology

## 2022-10-11 ENCOUNTER — Telehealth (HOSPITAL_COMMUNITY): Payer: Self-pay | Admitting: Cardiology

## 2022-10-11 DIAGNOSIS — I5032 Chronic diastolic (congestive) heart failure: Secondary | ICD-10-CM

## 2022-10-11 MED ORDER — METOLAZONE 2.5 MG PO TABS: 2.5000 mg | ORAL_TABLET | Freq: Once | ORAL | 0 refills | Status: DC

## 2022-10-11 MED ORDER — POTASSIUM CHLORIDE CRYS ER 20 MEQ PO TBCR: 40.0000 meq | EXTENDED_RELEASE_TABLET | Freq: Once | ORAL | 0 refills | Status: DC

## 2022-10-11 NOTE — Telephone Encounter (Signed)
Pt called with concerns of increased SOB, chest tightness, feeling full, and LE edema (unable to put on shoes with swelling)  Normal weight at home 170-172 weight is now 180 Was on torsemide 80/60 however pt increased to 80/80 5 days ago with minimal relief  Seen by pcp last week and had med changes however also as not seen any relief  Next fu 11/10/22 with Dr Aundra Dubin  Labs 09/30/22 K 4.6 Cr 1.41 BUN 45   Note message from PCP LLQ abdominal pain I am concerned the patient Delira have colitis or diverticulitis.  Begin Augmentin 875 mg twice daily for 10 days.  Reassess in 48 hours if no better or sooner if worse.        Electronically signed by [name], MD at 10/05/2022  2:59 PM

## 2022-10-11 NOTE — Telephone Encounter (Signed)
Pt aware and voiced understanding  Meds sent  Labs 2/9 F/u 2/23   Advised to wear compression hose,elevate legs throughout the day,drinking less than 2L daily, decrease sodium intake to less than 2000 mg daily  As always if she starts to feel worse before she feels better she should report to ER

## 2022-10-12 ENCOUNTER — Telehealth (HOSPITAL_COMMUNITY): Payer: Self-pay | Admitting: Cardiology

## 2022-10-12 DIAGNOSIS — I509 Heart failure, unspecified: Secondary | ICD-10-CM | POA: Diagnosis not present

## 2022-10-12 DIAGNOSIS — G8929 Other chronic pain: Secondary | ICD-10-CM | POA: Diagnosis not present

## 2022-10-12 DIAGNOSIS — Z7901 Long term (current) use of anticoagulants: Secondary | ICD-10-CM | POA: Diagnosis not present

## 2022-10-12 DIAGNOSIS — E785 Hyperlipidemia, unspecified: Secondary | ICD-10-CM | POA: Diagnosis not present

## 2022-10-12 DIAGNOSIS — K219 Gastro-esophageal reflux disease without esophagitis: Secondary | ICD-10-CM | POA: Diagnosis not present

## 2022-10-12 DIAGNOSIS — C50919 Malignant neoplasm of unspecified site of unspecified female breast: Secondary | ICD-10-CM | POA: Diagnosis not present

## 2022-10-12 DIAGNOSIS — E669 Obesity, unspecified: Secondary | ICD-10-CM | POA: Diagnosis not present

## 2022-10-12 DIAGNOSIS — Z6831 Body mass index (BMI) 31.0-31.9, adult: Secondary | ICD-10-CM | POA: Diagnosis not present

## 2022-10-12 DIAGNOSIS — Z9181 History of falling: Secondary | ICD-10-CM | POA: Diagnosis not present

## 2022-10-12 DIAGNOSIS — Z8673 Personal history of transient ischemic attack (TIA), and cerebral infarction without residual deficits: Secondary | ICD-10-CM | POA: Diagnosis not present

## 2022-10-12 DIAGNOSIS — I4891 Unspecified atrial fibrillation: Secondary | ICD-10-CM | POA: Diagnosis not present

## 2022-10-12 DIAGNOSIS — M545 Low back pain, unspecified: Secondary | ICD-10-CM | POA: Diagnosis not present

## 2022-10-12 DIAGNOSIS — I110 Hypertensive heart disease with heart failure: Secondary | ICD-10-CM | POA: Diagnosis not present

## 2022-10-12 NOTE — Telephone Encounter (Signed)
Cardio mems application faxed 01/1897 Pre services team with [employer] reports NPCR\   Spoke with patient to schedule procedure Pt is not interested in scheduling at this time

## 2022-10-13 ENCOUNTER — Telehealth: Payer: Self-pay | Admitting: Family Medicine

## 2022-10-13 DIAGNOSIS — M461 Sacroiliitis, not elsewhere classified: Secondary | ICD-10-CM | POA: Diagnosis not present

## 2022-10-13 NOTE — Telephone Encounter (Signed)
This is a CHF pt 

## 2022-10-13 NOTE — Telephone Encounter (Signed)
Patient called to report that although she is feeling somewhat better, the pain is still lingering on her L side; stated it's sore and hurts all the time. She's still unable to get into the bed.  Please advise at [phone].

## 2022-10-13 NOTE — Telephone Encounter (Signed)
Made appt w/you on Monday, 10/18/22 at 12.  Pls advice if any

## 2022-10-14 ENCOUNTER — Other Ambulatory Visit: Payer: Self-pay | Admitting: Cardiology

## 2022-10-14 DIAGNOSIS — G8929 Other chronic pain: Secondary | ICD-10-CM | POA: Diagnosis not present

## 2022-10-14 DIAGNOSIS — C50919 Malignant neoplasm of unspecified site of unspecified female breast: Secondary | ICD-10-CM | POA: Diagnosis not present

## 2022-10-14 DIAGNOSIS — E785 Hyperlipidemia, unspecified: Secondary | ICD-10-CM | POA: Diagnosis not present

## 2022-10-14 DIAGNOSIS — Z8673 Personal history of transient ischemic attack (TIA), and cerebral infarction without residual deficits: Secondary | ICD-10-CM | POA: Diagnosis not present

## 2022-10-14 DIAGNOSIS — I509 Heart failure, unspecified: Secondary | ICD-10-CM | POA: Diagnosis not present

## 2022-10-14 DIAGNOSIS — I110 Hypertensive heart disease with heart failure: Secondary | ICD-10-CM | POA: Diagnosis not present

## 2022-10-14 DIAGNOSIS — E669 Obesity, unspecified: Secondary | ICD-10-CM | POA: Diagnosis not present

## 2022-10-14 DIAGNOSIS — Z7901 Long term (current) use of anticoagulants: Secondary | ICD-10-CM | POA: Diagnosis not present

## 2022-10-14 DIAGNOSIS — K219 Gastro-esophageal reflux disease without esophagitis: Secondary | ICD-10-CM | POA: Diagnosis not present

## 2022-10-14 DIAGNOSIS — Z6831 Body mass index (BMI) 31.0-31.9, adult: Secondary | ICD-10-CM | POA: Diagnosis not present

## 2022-10-14 DIAGNOSIS — I4891 Unspecified atrial fibrillation: Secondary | ICD-10-CM | POA: Diagnosis not present

## 2022-10-14 DIAGNOSIS — M545 Low back pain, unspecified: Secondary | ICD-10-CM | POA: Diagnosis not present

## 2022-10-14 DIAGNOSIS — Z9181 History of falling: Secondary | ICD-10-CM | POA: Diagnosis not present

## 2022-10-15 ENCOUNTER — Ambulatory Visit (HOSPITAL_COMMUNITY)
Admission: RE | Admit: 2022-10-15 | Discharge: 2022-10-15 | Disposition: A | Discharge status: Home / Self Care | Payer: Medicare Other | Source: Ambulatory Visit | Attending: Cardiology | Admitting: Cardiology

## 2022-10-15 DIAGNOSIS — I5032 Chronic diastolic (congestive) heart failure: Secondary | ICD-10-CM | POA: Diagnosis not present

## 2022-10-15 LAB — BASIC METABOLIC PANEL
Anion gap: 15 (ref 5–15)
BUN: 71 mg/dL — ABNORMAL HIGH (ref 8–23)
CO2: 25 mmol/L (ref 22–32)
Calcium: 9.1 mg/dL (ref 8.9–10.3)
Chloride: 90 mmol/L — ABNORMAL LOW (ref 98–111)
Creatinine, Ser: 1.59 mg/dL — ABNORMAL HIGH (ref 0.44–1.00)
GFR, Estimated: 32 mL/min — ABNORMAL LOW (ref >60)
Glucose, Bld: 126 mg/dL — ABNORMAL HIGH (ref 70–99)
Potassium: 4.6 mmol/L (ref 3.5–5.1)
Sodium: 130 mmol/L — ABNORMAL LOW (ref 135–145)

## 2022-10-15 NOTE — Telephone Encounter (Signed)
This is a CHF pt 

## 2022-10-15 NOTE — Telephone Encounter (Signed)
LVM for pt re: pcp's recommendation in re: to pt's msg.

## 2022-10-18 ENCOUNTER — Ambulatory Visit (INDEPENDENT_AMBULATORY_CARE_PROVIDER_SITE_OTHER): Payer: Medicare Other | Admitting: Family Medicine

## 2022-10-18 VITALS — BP 120/62 | HR 70 | Temp 98.5°F | Ht 63.0 in | Wt 180.0 lb

## 2022-10-18 DIAGNOSIS — I5032 Chronic diastolic (congestive) heart failure: Secondary | ICD-10-CM

## 2022-10-18 DIAGNOSIS — R1032 Left lower quadrant pain: Secondary | ICD-10-CM

## 2022-10-18 NOTE — Progress Notes (Signed)
Subjective:    Patient ID: [name], female    DOB: 05/29/1935, 88 y.o.   MRN: [id number] 10/05/22  Patient is a very sweet 88 year old Caucasian female with complicated medical history.  She has a history of a right hemicolectomy.  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  She is also had her gallbladder removed. She has been dealing with generalized abdominal pain since early December.   Patient presents today complaining of left lower quadrant abdominal pain.  She had a CT scan of the abdomen and pelvis in late December 2023 that showed an air-fluid level in the transverse colon concerning for diarrheal illness.  The patient states that she has had significant pain in her left lower quadrant ever since that time.  She states that last week she was having severe diarrhea.  She denies any fevers or chills.  She does report some nausea.  She denies any hematochezia.  She denies any constipation.  She denies any dysuria or urgency or frequency.  At that time, my plan was: I am concerned the patient [place] have colitis or diverticulitis.  Begin Augmentin 875 mg twice daily for 10 days.  Reassess in 48 hours if no better or sooner if worse.  10/18/22 Patient presents today with a myriad of complaints.  She states that since she took the antibiotic she has been having runny stool.  She also states that she continues to have abdominal pain.  However the abdominal pain is not as bad as it was before.  She continues to complain of pain in her left lower quadrant however.  There is no guarding.  Her abdomen is soft.  There is no rebound but she reports feeling nauseated and sick all the time.  She also has tremendous swelling in her legs.  She has +3 pitting edema up to her knees bilaterally.  I inhaled her fluid pill due to severe hyponatremia last month.  She states that she went back on it independently about a week ago but the swelling is still severe.  Most recent sodium level is 130.  The skin over  both shins is red and tender from the swelling.  She also questions whether she should have an injection in her left sacroiliac joint due to the pain she is having in her legs.  She also wants to know if she should have a procedure done on her heart that her heart specialist recommended. Past Medical History:  Diagnosis Date   Allergy    rhinitis   Aortic stenosis    mild by echo 06/2017   Arthritis    Bradycardia    a. 10/2017 -> beta blocker cut back due to HR 39.   Breast cancer ([place]) 01/06/2012   Cancer ([place])    right colon and left breast   Chronic diastolic CHF (congestive heart failure) ([place])    Colon cancer ([place]) 01/06/2012   Colovesical fistula    Dr. Marlou Starks and [name] planning surgery 08/2018- surgery revealed spontaneous closure   COPD (chronic obstructive pulmonary disease) ([place])    pt. denies   Coronary artery disease 2006   a.  NSTEMI in 2016, cath showed 15% prox-mid RCA, 20% prox LAD, EF 25-35% by cath and 35-40% -> felt due to Takotsubo cardiomyopathy.   Dilated aortic root ([place])    73mHg by echo 06/2017   Diverticulosis    Dyspnea    Edema extremities    GERD (gastroesophageal reflux disease)  Hernia    Hiatal hernia    denies   Hyperlipidemia    Hypertension    Mild aortic stenosis    echo 11/2015 but not noted on echo 06/2016   Osteopenia    Permanent atrial fibrillation (HCC)    chronic atrial fibrillation   Pneumonia    hx child   Pulmonary HTN ([place])    a. moderate to severe PASP 44mHg echo 11/2015 - now 474mg by echo 06/2017. CTA chest in 11/16 with no PE. PFTs in 7/15 with mild obstructive lung disease. She had a negative sleep study in 2017. b. Felt due to left sided HF.   [facility]   Takotsubo syndrome 07/29/2015   a. EF 35-40% by echo; akinesis of mid-apical anteroseptal and apical myocardium.  EF now normalized on echo 11/2015   Past Surgical History:  Procedure Laterality Date   APPENDECTOMY     BREAST SURGERY     lumpectomy left    CARDIAC CATHETERIZATION     CARDIAC CATHETERIZATION N/A 07/28/2015   Procedure: Left Heart Cath and Coronary Angiography;  Surgeon: Peter M JoMartiniqueMD;  Location: [facility];  Service: Cardiovascular;  Laterality: N/A;   CHOLECYSTECTOMY     COLECTOMY     right side   CYSTOSCOPY WITH STENT PLACEMENT Left 09/04/2018   Procedure: CYSTOSCOPY WITH LEFT STENT PLACEMENT, BLADDER REPAIR, CYSTOSCOPY WITH LEFT STENT REMOVAL;  Surgeon: BoRaynelle BringMD;  Location: [facility];  Service: Urology;  Laterality: Left;   EXCISION OF ACCESSORY NIPPLE Bilateral 05/30/2013   Procedure: BILATERAL NIPPLE BIOPSY;  Surgeon: PaMerrie RoofMD;  Location: [facility] Service: General;  Laterality: Bilateral;   EYE SURGERY Bilateral 12   cataracts   HYSTEROSCOPY WITH D & C N/A 05/29/2020   Procedure: DILATATION AND CURETTAGE /HYSTEROSCOPY;  Surgeon: ScHomero FellersMD;  Location: [facility];  Service: Gynecology;  Laterality: N/A;   IR RADIOLOGIST EVAL & MGMT  07/26/2017   LAPAROSCOPIC RIGHT COLECTOMY N/A 09/04/2018   Procedure: LAPAROSCOPIC ASSISTED SIGMOID COLECTOMY WITH REPAIR OF FISTULA TO BLADDER;  Surgeon: ToJovita KussmaulMD;  Location[facility];  Service: General;  Laterality: N/A;   RIGHT/LEFT HEART CATH AND CORONARY ANGIOGRAPHY N/A 12/10/2020   Procedure: RIGHT/LEFT HEART CATH AND CORONARY ANGIOGRAPHY;  Surgeon: McLarey DresserMD;  [facility];  Service: Cardiovascular;  Laterality: N/A;   SPLIT NIGHT STUDY  02/02/2016       Current Outpatient Medications on File Prior to Visit  Medication Sig Dispense Refill   acetaminophen (TYLENOL) 500 MG tablet Take 1,000 mg by mouth every 6 (six) hours as needed for headache or moderate pain.     amLODipine (NORVASC) 5 MG tablet Take 1 tablet (5 mg total) by mouth daily. 90 tablet 3   amoxicillin-clavulanate (AUGMENTIN) 875-125 MG tablet Take 1 tablet by mouth 2 (two) times daily. 20 tablet 0   apixaban (ELIQUIS) 2.5 MG TABS tablet Take 1 tablet  (2.5 mg total) by mouth 2 (two) times daily. 60 tablet 5   apixaban (ELIQUIS) 5 MG TABS tablet Take 1 tablet by mouth twice daily 180 tablet 1   Azelastine HCl 137 MCG/SPRAY SOLN Place 1 spray into both nostrils 2 (two) times daily as needed (congestion).     Cholecalciferol (VITAMIN D3) 25 MCG (1000 UT) CAPS Take 1,000 Units by mouth once a week.     clobetasol ointment (TEMOVATE) 0.AB-123456789 Apply 1 Application topically daily as needed (irritation).  clonazePAM (KLONOPIN) 0.5 MG tablet Take 1 tablet by mouth twice daily as needed for anxiety 60 tablet 0   cloNIDine (CATAPRES) 0.1 MG tablet Take 1 tablet by mouth twice daily 180 tablet 0   famotidine (PEPCID) 20 MG tablet Take 20 mg by mouth at bedtime as needed for heartburn.     ferrous sulfate 325 (65 FE) MG tablet Take 1 tablet (325 mg total) by mouth daily with breakfast. 90 tablet 3   fluconazole (DIFLUCAN) 150 MG tablet Take 150 mg by mouth once.     fluticasone (FLONASE) 50 MCG/ACT nasal spray Place 1 spray into both nostrils daily as needed for allergies or rhinitis.     levocetirizine (XYZAL ALLERGY 24HR) 5 MG tablet Take 1 tablet (5 mg total) by mouth every evening. 30 tablet 0   losartan (COZAAR) 100 MG tablet Take 1 tablet by mouth once daily 90 tablet 0   methylPREDNISolone (MEDROL DOSEPAK) 4 MG TBPK tablet Take by mouth.     metolazone (ZAROXOLYN) 2.5 MG tablet Take 1 tablet (2.5 mg total) by mouth once for 1 dose. With 40 meq of potassium 1 tablet 0   nitroGLYCERIN (NITROSTAT) 0.4 MG SL tablet DISSOLVE ONE TABLET UNDER THE TONGUE EVERY 5 MINUTES AS NEEDED FOR CHEST PAIN.  DO NOT EXCEED A TOTAL OF 3 DOSES IN 15 MINUTES 25 tablet 8   ondansetron (ZOFRAN-ODT) 4 MG disintegrating tablet Take 1 tablet (4 mg total) by mouth every 8 (eight) hours as needed for nausea or vomiting. 20 tablet 0   potassium chloride SA (KLOR-CON M) 20 MEQ tablet Take 2 tablets (40 mEq total) by mouth once for 1 dose. 2 tablet 0   predniSONE (DELTASONE) 20 MG  tablet 3 tabs poqday 1-2, 2 tabs poqday 3-4, 1 tab poqday 5-6 12 tablet 0   ranolazine (RANEXA) 500 MG 12 hr tablet Take 500 mg by mouth as needed.     spironolactone (ALDACTONE) 25 MG tablet Take 1 tablet by mouth once daily 90 tablet 2   torsemide (DEMADEX) 20 MG tablet Take 4 tablets (80 mg total) by mouth every morning AND 3 tablets (60 mg total) every evening. 210 tablet 5   No current facility-administered medications on file prior to visit.   Allergies  Allergen Reactions   Contrast Media [Iodinated Contrast Media] Hives and Other (See Comments)    Per pt strong burning sensation starting in chest radiating outward    Clonidine Derivatives Other (See Comments)    Throat dry   Statins Other (See Comments)    "bones hurt"   Sulfa Antibiotics Diarrhea    Tremors    Celebrex [Celecoxib] Rash   Isosorbide Nitrate Itching and Rash   Other Itching and Rash    Plastic and paper tape and heart monitor pads   Tape Itching and Rash    Red Where applied and will spread   Social History   Socioeconomic History   Marital status: Widowed    Spouse name: Not on file   Number of children: 1   Years of education: Not on file   Highest education level: Not on file  Occupational History   Not on file  Tobacco Use   Smoking status: Never   Smokeless tobacco: Never  Vaping Use   Vaping Use: Never used  Substance and Sexual Activity   Alcohol use: No   Drug use: No   Sexual activity: Not Currently  Other Topics Concern   Not on file  Social History  Narrative   Husband Braulio Conte, passed away in 02/01/2022. Pt's son, Sherlon Handing lives with patient and helps with her care.    Social Determinants of Health   Financial Resource Strain: Low Risk  (08/11/2022)   Overall Financial Resource Strain (CARDIA)    Difficulty of Paying Living Expenses: Not hard at all  Food Insecurity: No Food Insecurity (09/03/2022)   Hunger Vital Sign    Worried About Running Out of Food in the Last Year:  Never true    Ran Out of Food in the Last Year: Never true  Transportation Needs: No Transportation Needs (08/11/2022)   PRAPARE - Hydrologist (Medical): No    Lack of Transportation (Non-Medical): No  Physical Activity: Insufficiently Active (08/11/2022)   Exercise Vital Sign    Days of Exercise per Week: 5 days    Minutes of Exercise per Session: 10 min  Stress: No Stress Concern Present (08/11/2022)   [place]    Feeling of Stress : Not at all  Social Connections: Moderately Isolated (08/11/2022)   Social Connection and Isolation Panel [NHANES]    Frequency of Communication with Friends and Family: More than three times a week    Frequency of Social Gatherings with Friends and Family: Twice a week    Attends Religious Services: More than 4 times per year    Active Member of [employer] or Organizations: No    Attends Archivist Meetings: Never    Marital Status: Widowed  Intimate Partner Violence: Not At Risk (08/11/2022)   Humiliation, Afraid, Rape, and Kick questionnaire    Fear of Current or Ex-Partner: No    Emotionally Abused: No    Physically Abused: No    Sexually Abused: No      Review of Systems  Musculoskeletal:  Positive for back pain.  All other systems reviewed and are negative.      Objective:   Physical Exam Vitals reviewed.  Constitutional:      General: She is not in acute distress.    Appearance: Normal appearance. She is well-developed and normal weight. She is not diaphoretic.  HENT:     Left Ear: Ear canal normal.     Nose: No congestion or rhinorrhea.     Mouth/Throat:     Mouth: Mucous membranes are moist.     Pharynx: No oropharyngeal exudate or posterior oropharyngeal erythema.  Eyes:     General:        Right eye: No discharge.        Left eye: No discharge.     Conjunctiva/sclera: Conjunctivae normal.  Neck:     Vascular: No carotid  bruit.  Cardiovascular:     Rate and Rhythm: Normal rate and regular rhythm.     Heart sounds: Murmur heard.     No friction rub. No gallop.  Pulmonary:     Effort: Pulmonary effort is normal. No accessory muscle usage, prolonged expiration or respiratory distress.     Breath sounds: Normal breath sounds and air entry. No stridor, decreased air movement or transmitted upper airway sounds. No decreased breath sounds, wheezing, rhonchi or rales.  Chest:     Chest wall: No mass, lacerations, deformity, swelling or crepitus.  Abdominal:     General: Abdomen is flat. Bowel sounds are decreased. There is no distension.     Palpations: Abdomen is not rigid.     Tenderness: There is no guarding or  rebound.     Hernia: No hernia is present.  Musculoskeletal:     Cervical back: No rigidity or tenderness.     Right lower leg: 3+ Edema present.     Left lower leg: 3+ Edema present.  Skin:    Findings: No bruising or lesion.  Neurological:     Mental Status: She is alert. Mental status is at baseline.     Motor: No abnormal muscle tone.     Coordination: Coordination normal.     Gait: Gait normal.     Deep Tendon Reflexes: Reflexes normal.  Psychiatric:        Mood and Affect: Mood normal.        Behavior: Behavior normal.        Thought Content: Thought content normal.        Judgment: Judgment normal.          Assessment & Plan:  LLQ abdominal pain  Chronic diastolic CHF (congestive heart failure) ([place]) Patient appears fluid overloaded on exam.  Continue her current dose of diuretic 4 tablets in the morning 4 tablets in the evening however I will place the patient on Unna boots bilaterally.  I believe the patient is third spacing.  Rather than exacerbate her hyponatremia or renal insufficiency, I believe that I can drive the edema back into the intravascular space using Unna boots to facilitate diuresis.  I will plan to remove the Unna boots on Thursday.  Hopefully at that point her  chronic dose of diuretic will be able to maintain her fluid status without increasing it once we have the edema down.  At this point the patient has had 2 months of chronic abdominal pain with CT scans and lab work that have been nonspecific and unremarkable.  At this point I will pursue a consultation with GI as I do not know the source of her chronic left-sided abdominal pain.

## 2022-10-19 DIAGNOSIS — K219 Gastro-esophageal reflux disease without esophagitis: Secondary | ICD-10-CM | POA: Diagnosis not present

## 2022-10-19 DIAGNOSIS — Z6831 Body mass index (BMI) 31.0-31.9, adult: Secondary | ICD-10-CM | POA: Diagnosis not present

## 2022-10-19 DIAGNOSIS — Z9181 History of falling: Secondary | ICD-10-CM | POA: Diagnosis not present

## 2022-10-19 DIAGNOSIS — E785 Hyperlipidemia, unspecified: Secondary | ICD-10-CM | POA: Diagnosis not present

## 2022-10-19 DIAGNOSIS — E669 Obesity, unspecified: Secondary | ICD-10-CM | POA: Diagnosis not present

## 2022-10-19 DIAGNOSIS — C50919 Malignant neoplasm of unspecified site of unspecified female breast: Secondary | ICD-10-CM | POA: Diagnosis not present

## 2022-10-19 DIAGNOSIS — I509 Heart failure, unspecified: Secondary | ICD-10-CM | POA: Diagnosis not present

## 2022-10-19 DIAGNOSIS — I110 Hypertensive heart disease with heart failure: Secondary | ICD-10-CM | POA: Diagnosis not present

## 2022-10-19 DIAGNOSIS — M545 Low back pain, unspecified: Secondary | ICD-10-CM | POA: Diagnosis not present

## 2022-10-19 DIAGNOSIS — I4891 Unspecified atrial fibrillation: Secondary | ICD-10-CM | POA: Diagnosis not present

## 2022-10-19 DIAGNOSIS — Z7901 Long term (current) use of anticoagulants: Secondary | ICD-10-CM | POA: Diagnosis not present

## 2022-10-19 DIAGNOSIS — G8929 Other chronic pain: Secondary | ICD-10-CM | POA: Diagnosis not present

## 2022-10-19 DIAGNOSIS — Z8673 Personal history of transient ischemic attack (TIA), and cerebral infarction without residual deficits: Secondary | ICD-10-CM | POA: Diagnosis not present

## 2022-10-20 DIAGNOSIS — G8929 Other chronic pain: Secondary | ICD-10-CM | POA: Diagnosis not present

## 2022-10-20 DIAGNOSIS — Z8673 Personal history of transient ischemic attack (TIA), and cerebral infarction without residual deficits: Secondary | ICD-10-CM | POA: Diagnosis not present

## 2022-10-20 DIAGNOSIS — M545 Low back pain, unspecified: Secondary | ICD-10-CM | POA: Diagnosis not present

## 2022-10-20 DIAGNOSIS — Z7901 Long term (current) use of anticoagulants: Secondary | ICD-10-CM | POA: Diagnosis not present

## 2022-10-20 DIAGNOSIS — K219 Gastro-esophageal reflux disease without esophagitis: Secondary | ICD-10-CM | POA: Diagnosis not present

## 2022-10-20 DIAGNOSIS — I4891 Unspecified atrial fibrillation: Secondary | ICD-10-CM | POA: Diagnosis not present

## 2022-10-20 DIAGNOSIS — Z6831 Body mass index (BMI) 31.0-31.9, adult: Secondary | ICD-10-CM | POA: Diagnosis not present

## 2022-10-20 DIAGNOSIS — E785 Hyperlipidemia, unspecified: Secondary | ICD-10-CM | POA: Diagnosis not present

## 2022-10-20 DIAGNOSIS — C50919 Malignant neoplasm of unspecified site of unspecified female breast: Secondary | ICD-10-CM | POA: Diagnosis not present

## 2022-10-20 DIAGNOSIS — E669 Obesity, unspecified: Secondary | ICD-10-CM | POA: Diagnosis not present

## 2022-10-20 DIAGNOSIS — Z9181 History of falling: Secondary | ICD-10-CM | POA: Diagnosis not present

## 2022-10-20 DIAGNOSIS — I509 Heart failure, unspecified: Secondary | ICD-10-CM | POA: Diagnosis not present

## 2022-10-20 DIAGNOSIS — I110 Hypertensive heart disease with heart failure: Secondary | ICD-10-CM | POA: Diagnosis not present

## 2022-10-21 ENCOUNTER — Ambulatory Visit (INDEPENDENT_AMBULATORY_CARE_PROVIDER_SITE_OTHER): Payer: Medicare Other | Admitting: Family Medicine

## 2022-10-21 ENCOUNTER — Encounter: Payer: Self-pay | Admitting: Family Medicine

## 2022-10-21 VITALS — BP 120/64 | HR 51 | Temp 98.6°F | Ht 63.0 in | Wt 178.8 lb

## 2022-10-21 DIAGNOSIS — I5032 Chronic diastolic (congestive) heart failure: Secondary | ICD-10-CM

## 2022-10-21 NOTE — Telephone Encounter (Signed)
error 

## 2022-10-21 NOTE — Progress Notes (Signed)
   Acute Office Visit  Subjective:     Patient ID: [name], female    DOB: 01/21/1935, 88 y.o.   MRN: [id number]  Chief Complaint  Patient presents with   Follow-up    HPI Patient is in today for unna boot removal. She had bilateral Unna boots placed 3 days ago and has shown great improvement in her edema. She reports both legs are back down to her baseline size. I do not see a need to replace the boots at this time. Will continue her diuretic as prescribed.  Review of Systems  All other systems reviewed and are negative.       Objective:    BP 120/64   Pulse (!) 51   Temp 98.6 F (37 C)   Ht '5\' 3"'$  (1.6 m)   Wt 178 lb 12.8 oz (81.1 kg)   SpO2 96%   BMI 31.67 kg/m    Physical Exam Vitals and nursing note reviewed.  Constitutional:      Appearance: Normal appearance. She is normal weight.  HENT:     Head: Normocephalic and atraumatic.  Musculoskeletal:     Right lower leg: 1+ Edema present.     Left lower leg: 1+ Edema present.  Skin:    General: Skin is warm and dry.  Neurological:     General: No focal deficit present.     Mental Status: She is alert and oriented to person, place, and time. Mental status is at baseline.  Psychiatric:        Mood and Affect: Mood normal.        Behavior: Behavior normal.        Thought Content: Thought content normal.        Judgment: Judgment normal.     No results found for any visits on 10/21/22.      Assessment & Plan:   Problem List Items Addressed This Visit       Cardiovascular and Mediastinum   Chronic diastolic CHF (congestive heart failure) ([place]) - Primary    Unna boots removed today. BLE edema is improved. No shortness of breath, weight gain, chest pain. Return to office if swelling returns.       No orders of the defined types were placed in this encounter.   Return if symptoms worsen or fail to improve.  Rubie Maid, FNP

## 2022-10-21 NOTE — Assessment & Plan Note (Signed)
Unna boots removed today. BLE edema is improved. No shortness of breath, weight gain, chest pain. Return to office if swelling returns.

## 2022-10-26 ENCOUNTER — Ambulatory Visit: Payer: Medicare Other | Admitting: Family Medicine

## 2022-10-26 ENCOUNTER — Telehealth: Payer: Self-pay | Admitting: Pharmacist

## 2022-10-26 NOTE — Progress Notes (Signed)
Care Management & Coordination Services Pharmacy Team  Reason for Encounter: Appointment Reminder  Contacted patient to confirm telephone appointment with Leata Mouse, PharmD on 10/28/2022 at 12 pm. Spoke with patient on 10/26/2022    Star Rating Drugs:  Losartan 100 mg last filled 09/25/2022 90 DS   Care Gaps: Annual wellness visit in last year? Yes   Future Appointments  Date Time Provider [place]  10/27/2022 11:00 AM [name], MD [facility]  10/28/2022 12:00 PM Edythe Clarity, [facility] None  10/29/2022 12:00 PM [facility] PA/NP [facility] None  11/10/2022  2:00 PM Larey Dresser, MD [facility] None  12/29/2022  2:45 PM Garrel Ridgel, DPM [facility]   April D Calhoun, [place] Pharmacist Assistant [phone]

## 2022-10-27 ENCOUNTER — Encounter: Payer: Self-pay | Admitting: Family Medicine

## 2022-10-27 ENCOUNTER — Ambulatory Visit: Payer: Medicare Other | Admitting: Cardiology

## 2022-10-27 ENCOUNTER — Ambulatory Visit (INDEPENDENT_AMBULATORY_CARE_PROVIDER_SITE_OTHER): Payer: Medicare Other | Admitting: Family Medicine

## 2022-10-27 VITALS — BP 142/38 | HR 51 | Temp 97.8°F | Ht 63.0 in | Wt 176.0 lb

## 2022-10-27 DIAGNOSIS — J019 Acute sinusitis, unspecified: Secondary | ICD-10-CM | POA: Diagnosis not present

## 2022-10-27 DIAGNOSIS — G8929 Other chronic pain: Secondary | ICD-10-CM | POA: Diagnosis not present

## 2022-10-27 DIAGNOSIS — I509 Heart failure, unspecified: Secondary | ICD-10-CM | POA: Diagnosis not present

## 2022-10-27 DIAGNOSIS — B9689 Other specified bacterial agents as the cause of diseases classified elsewhere: Secondary | ICD-10-CM

## 2022-10-27 DIAGNOSIS — I110 Hypertensive heart disease with heart failure: Secondary | ICD-10-CM | POA: Diagnosis not present

## 2022-10-27 DIAGNOSIS — I4891 Unspecified atrial fibrillation: Secondary | ICD-10-CM | POA: Diagnosis not present

## 2022-10-27 MED ORDER — DOXYCYCLINE HYCLATE 100 MG PO TABS: 100.0000 mg | ORAL_TABLET | Freq: Two times a day (BID) | ORAL | 0 refills | Status: DC

## 2022-10-27 MED ORDER — ALBUTEROL SULFATE HFA 108 (90 BASE) MCG/ACT IN AERS: 2.0000 | INHALATION_SPRAY | Freq: Four times a day (QID) | RESPIRATORY_TRACT | 0 refills | Status: DC | PRN

## 2022-10-27 NOTE — Assessment & Plan Note (Signed)
Exam findings consistent with bacterial sinusitis including subjective fever, unilateral facial sinus tenderness and unilateral rhinorrhea. Will treat with 7 days Doxy 128m BID. She is also complaining of wheezing and shortness of breath with exertion since onset of illness, will refill Albuterol 2 puffs q6h PRN for wheeze and SOB. No signs of fluid overload or CHF exacerbation on exam. Instructed to return to office if symptoms persist or worsen or if she experiences shortness of breath while lying down, worsening SOB, lower extremity swelling, or weight gain.

## 2022-10-27 NOTE — Progress Notes (Signed)
Acute Office Visit  Subjective:     Patient ID: [name], female    DOB: May 13, 1935, 88 y.o.   MRN: [id number]  No chief complaint on file.   HPI Patient is in today for 3-4 days shortness of breath with activity, wheezing in AM, right sided nasal congestion, right sided facial sinus pressure, headache, chills, subjective fever, mild cough Denies sick exposures, orthopnea, increased swelling of extremities Has tried Mucinex, Coriciden without relief  Review of Systems  All other systems reviewed and are negative.       Objective:    BP (!) 142/38 Comment: recheck/confirm w/LPN-Mary Jane  Pulse (!) 51   Temp 97.8 F (36.6 C) (Oral)   Ht 5' 3"$  (1.6 m)   Wt 176 lb (79.8 kg)   SpO2 92%   BMI 31.18 kg/m    Physical Exam Vitals and nursing note reviewed.  Constitutional:      Appearance: Normal appearance. She is normal weight.  HENT:     Head: Normocephalic and atraumatic.     Right Ear: Tympanic membrane, ear canal and external ear normal.     Left Ear: Tympanic membrane, ear canal and external ear normal.     Nose: Congestion present.     Right Sinus: Frontal sinus tenderness present.     Mouth/Throat:     Mouth: Mucous membranes are moist.     Pharynx: Oropharynx is clear.  Eyes:     Conjunctiva/sclera: Conjunctivae normal.  Cardiovascular:     Rate and Rhythm: Normal rate. Rhythm irregular.     Pulses: Normal pulses.     Heart sounds: Murmur heard.  Pulmonary:     Effort: Pulmonary effort is normal. No respiratory distress.     Breath sounds: Decreased breath sounds present. No wheezing, rhonchi or rales.  Musculoskeletal:     Right lower leg: 1+ Pitting Edema (reported as baseline) present.     Left lower leg: 1+ Pitting Edema (reported as baseline) present.  Skin:    General: Skin is warm and dry.  Neurological:     General: No focal deficit present.     Mental Status: She is alert and oriented to person, place, and time. Mental status is at  baseline.  Psychiatric:        Mood and Affect: Mood normal.        Behavior: Behavior normal.        Thought Content: Thought content normal.        Judgment: Judgment normal.     No results found for any visits on 10/27/22.      Assessment & Plan:   Problem List Items Addressed This Visit       Respiratory   Acute bacterial sinusitis - Primary    Exam findings consistent with bacterial sinusitis including subjective fever, unilateral facial sinus tenderness and unilateral rhinorrhea. Will treat with 7 days Doxy 133m BID. She is also complaining of wheezing and shortness of breath with exertion since onset of illness, will refill Albuterol 2 puffs q6h PRN for wheeze and SOB. No signs of fluid overload or CHF exacerbation on exam. Instructed to return to office if symptoms persist or worsen or if she experiences shortness of breath while lying down, worsening SOB, lower extremity swelling, or weight gain.      Relevant Medications   doxycycline (VIBRA-TABS) 100 MG tablet    Meds ordered this encounter  Medications   doxycycline (VIBRA-TABS) 100 MG tablet    Sig: Take  1 tablet (100 mg total) by mouth 2 (two) times daily.    Dispense:  14 tablet    Refill:  0    Order Specific Question:   Supervising Provider    Answer:   Jenna Luo T [[id number]]   albuterol (VENTOLIN HFA) 108 (90 Base) MCG/ACT inhaler    Sig: Inhale 2 puffs into the lungs every 6 (six) hours as needed for wheezing or shortness of breath.    Dispense:  8 g    Refill:  0    Order Specific Question:   Supervising Provider    Answer:   Jenna Luo T [id number]    Return if symptoms worsen or fail to improve.  Rubie Maid, FNP

## 2022-10-27 NOTE — Progress Notes (Signed)
Patient ID: [name], female   DOB: Jun 14, 1935, 88 y.o.   MRN: [id number]    Advanced Heart Failure Clinic Note   PCP: [name], MD Cardiology: Dr. [name] HF Cardiology: Dr. Aundra Dubin  88 y.o. with history of poorly controlled HTN, chronic atrial fibrillation, chronic diastolic CHF, and prior episode of Takotsubo cardiomyopathy. Patient has had exertional dyspnea since 11/16.  At that time, she presented to the hospital with chest pain.  Coronary angiography showed no significant CAD.  EF was 35-40% by echo, suspected Takotsubo cardiomyopathy.  Her echo in 3/17 showed EF improved to 123456 but PA systolic pressure elevated at 65 mmHg.    RHC/LHC in 4/22 showed nonobstructive CAD; mean RA 5, PA 44/10, mean PCWP 20, CI 4.89, PVR < 1 WU.  Echo in 8/22 showed EF up to 65-70%, mild LVH, normal RV, PASP 67, moderate RAE, trivial MR. PYP scan in 9/22 was probably negative.   Patient was admitted in 7/23 with chest pain, she had a mild elevation in troponin with no trend.  This was thought to be due to demand ischemia with volume overload rather than ACS.  Echo this admission showed EF 60-65%, mild LVH, moderate RV enlargement, normal RV systolic function, PASP 50 mmHg, mild mitral stenosis with mean gradient 4 mmHg, mild-moderate AS, IVC normal.   7 day Zio 11/23 showed atrial fibrillation, average HR 54 bpm with 2 3-second pauses.  Today she returns for HF follow up. Overall feeling fine. She is short of breath walking further distances on flat ground. She feels more SOB walking back to clinic today. She forgets to take her diuretics 1-2x/week. She some LE swelling. Denies palpitations, CP, dizziness,or PND/Orthopnea. Appetite ok. No fever or chills. Weight at home 171-176 pounds. Taking all medications. Eating more recently.  Today she returns for an acute visit, she has had CP x 4 days, nauseated and vomited today. CP worse with walking. She is SOB walking short distances. LLE is swelling.  Appetite ok. No fever or chills. Weight stable. Taking all medications. Recently started doxy for sinus infection  ReDs: 32%  Labs (11/23): K 4.1, creatinine 1.85, hgb 10.7 Labs (2/24): K 4.6, creatinine 1.59  PMH: 1. Hyperlipidemia: Myalgias with atorvastatin, Crestor, simvastatin. Myalgias with Zetia.  2. Chronic atrial fibrillation 3. HTN: Poor control.  4. H/o breast cancer: 2013.  5. GERD 6. Takotsubo cardiomyopathy: Admitted in 11/16 with chest pain.  Coronary angiography showed nonobstructive CAD.  Echo (11/16) showed EF 35-40%.  EF back to normal by 3/17 echo.  7. Chronic diastolic CHF - Echo ([id number]) with EF 60-65%, mild aortic stenosis, PA systolic pressure 65 mmHg, RV mildly dilated with normal systolic function.  - Cardiolite (4/17) with EF 61%, small reversible mid anteroseptal/apical lateral defects thought to be related to shifting breast artifact => low risk study.  - Echo (12/21): EF 65-70%, normal RV, PASP 60 mmHg, mild AS, mild-moderate TR.   - RHC/LHC in 4/22 showed nonobstructive CAD; mean RA 5, PA 44/10, mean PCWP 20, CI 4.89, PVR < 1 WU.   - Echo in 8/22 showed EF up to 65-70%, mild LVH, normal RV, PASP 67, moderate RAE, trivial MR.  - PYP scan (9/22): H/CL < 1.5, grade 1.  Probably negative.   - Echo (7/23): EF 60-65%, mild LVH, moderate RV enlargement, normal RV systolic function, PASP 50 mmHg, mild mitral stenosis with mean gradient 4 mmHg, mild-moderate AS, IVC normal.  8. Ascending aortic aneurysm: 4.3 cm by CTA in  11/16.  9. Pulmonary hypertension: PASP 65 mmHg by echo in 3/17.  CTA chest in 11/16 with no PE.  PFTs in 7/15 with mild obstructive lung disease.  10. Sleep study negative 2017. 11. Bradycardia: off nodal blockers.  - Holter (8/20): Average HR 51, afib - 7 day Zio (11/23): AF average HR 54 bpm, 2 3-second pauses. 12. Aortic stenosis: Mild-moderate 7/23 echo. 13. Mitral stenosis: Mild 7/23 echo   Social History   Socioeconomic History   Marital  status: Widowed    Spouse name: Not on file   Number of children: 1   Years of education: Not on file   Highest education level: Not on file  Occupational History   Not on file  Tobacco Use   Smoking status: Never   Smokeless tobacco: Never  Vaping Use   Vaping Use: Never used  Substance and Sexual Activity   Alcohol use: No   Drug use: No   Sexual activity: Not Currently  Other Topics Concern   Not on file  Social History Narrative   Husband Braulio Conte, passed away in 2022-01-20. Pt's son, Sherlon Handing lives with patient and helps with her care.    Social Determinants of Health   Financial Resource Strain: Low Risk  (08/11/2022)   Overall Financial Resource Strain (CARDIA)    Difficulty of Paying Living Expenses: Not hard at all  Food Insecurity: No Food Insecurity (09/03/2022)   Hunger Vital Sign    Worried About Running Out of Food in the Last Year: Never true    Ran Out of Food in the Last Year: Never true  Transportation Needs: No Transportation Needs (08/11/2022)   PRAPARE - Hydrologist (Medical): No    Lack of Transportation (Non-Medical): No  Physical Activity: Insufficiently Active (08/11/2022)   Exercise Vital Sign    Days of Exercise per Week: 5 days    Minutes of Exercise per Session: 10 min  Stress: No Stress Concern Present (08/11/2022)   [place]    Feeling of Stress : Not at all  Social Connections: Moderately Isolated (08/11/2022)   Social Connection and Isolation Panel [NHANES]    Frequency of Communication with Friends and Family: More than three times a week    Frequency of Social Gatherings with Friends and Family: Twice a week    Attends Religious Services: More than 4 times per year    Active Member of [employer] or Organizations: No    Attends Archivist Meetings: Never    Marital Status: Widowed  Intimate Partner Violence: Not At Risk (08/11/2022)    Humiliation, Afraid, Rape, and Kick questionnaire    Fear of Current or Ex-Partner: No    Emotionally Abused: No    Physically Abused: No    Sexually Abused: No   Family History  Problem Relation Age of Onset   Heart disease Mother    Heart attack Mother    Cancer Sister        stomach and colon   Heart disease Brother 67   Hypertension Father    Cancer Sister    Stroke Neg Hx    ROS: All systems reviewed and negative except as per HPI.   Current Outpatient Medications  Medication Sig Dispense Refill   acetaminophen (TYLENOL) 500 MG tablet Take 1,000 mg by mouth every 6 (six) hours as needed for headache or moderate pain.     albuterol (VENTOLIN HFA) 108 (  90 Base) MCG/ACT inhaler Inhale 2 puffs into the lungs every 6 (six) hours as needed for wheezing or shortness of breath. 8 g 0   amLODipine (NORVASC) 5 MG tablet Take 1 tablet (5 mg total) by mouth daily. 90 tablet 3   apixaban (ELIQUIS) 2.5 MG TABS tablet Take 1 tablet (2.5 mg total) by mouth 2 (two) times daily. 60 tablet 5   Azelastine HCl 137 MCG/SPRAY SOLN Place 1 spray into both nostrils 2 (two) times daily as needed (congestion).     Cholecalciferol (VITAMIN D3) 25 MCG (1000 UT) CAPS Take 1,000 Units by mouth once a week.     clobetasol ointment (TEMOVATE) [id number] % Apply 1 Application topically daily as needed (irritation).     clonazePAM (KLONOPIN) 0.5 MG tablet Take 1 tablet by mouth twice daily as needed for anxiety 60 tablet 0   cloNIDine (CATAPRES) 0.1 MG tablet Take 1 tablet by mouth twice daily 180 tablet 0   doxycycline (VIBRA-TABS) 100 MG tablet Take 1 tablet (100 mg total) by mouth 2 (two) times daily. (Patient taking differently: Take 100 mg by mouth daily.) 14 tablet 0   famotidine (PEPCID) 20 MG tablet Take 20 mg by mouth at bedtime as needed for heartburn.     ferrous sulfate 325 (65 FE) MG tablet Take 1 tablet (325 mg total) by mouth daily with breakfast. 90 tablet 3   fluticasone (FLONASE) 50 MCG/ACT nasal  spray Place 1 spray into both nostrils daily as needed for allergies or rhinitis.     levocetirizine (XYZAL ALLERGY 24HR) 5 MG tablet Take 1 tablet (5 mg total) by mouth every evening. 30 tablet 0   losartan (COZAAR) 100 MG tablet Take 1 tablet by mouth once daily 90 tablet 0   nitroGLYCERIN (NITROSTAT) 0.4 MG SL tablet DISSOLVE ONE TABLET UNDER THE TONGUE EVERY 5 MINUTES AS NEEDED FOR CHEST PAIN.  DO NOT EXCEED A TOTAL OF 3 DOSES IN 15 MINUTES 25 tablet 8   ondansetron (ZOFRAN-ODT) 4 MG disintegrating tablet Take 1 tablet (4 mg total) by mouth every 8 (eight) hours as needed for nausea or vomiting. 20 tablet 0   predniSONE (DELTASONE) 20 MG tablet 3 tabs poqday 1-2, 2 tabs poqday 3-4, 1 tab poqday 5-6 12 tablet 0   ranolazine (RANEXA) 500 MG 12 hr tablet Take 500 mg by mouth as needed.     spironolactone (ALDACTONE) 25 MG tablet Take 1 tablet by mouth once daily 90 tablet 2   torsemide (DEMADEX) 20 MG tablet Take 4 tablets (80 mg total) by mouth every morning AND 3 tablets (60 mg total) every evening. 210 tablet 5   No current facility-administered medications for this encounter.   BP (!) 178/66   Pulse 63   Wt 78.9 kg (174 lb)   SpO2 93%   BMI 30.82 kg/m    Wt Readings from Last 3 Encounters:  10/29/22 78.9 kg (174 lb)  10/27/22 79.8 kg (176 lb)  10/21/22 81.1 kg (178 lb 12.8 oz)   Physical Exam  General:  NAD. No resp difficulty, arrived in [facility], tearful HEENT: Normal Neck: Supple. JVP 7-8. Carotids 2+ bilat; no bruits. No lymphadenopathy or thryomegaly appreciated. Cor: PMI nondisplaced. Regular rate & rhythm. No rubs, gallops or murmurs. Lungs: Clear, diminished in bases Abdomen: Soft, nontender, nondistended. No hepatosplenomegaly. No bruits or masses. Good bowel sounds. Extremities: No cyanosis, clubbing, rash, 1+ LLE edema Neuro: Alert & oriented x 3, cranial nerves grossly intact. Moves all 4 extremities w/o difficulty.  Affect pleasant.  Assessment/Plan: 1.  CAD:  Nonobstructive on 4/22 cath. She has been intolerant of statins and Zetia.   She has had atypical chest pain in the past. Admission 7/23 with CP, elevated HsT, CT ruled out acute aortic syndrome, echo ok. Pain felt to be related to coronary microvascular dysfunction 2/2 dHF vs MSK.  Now with new exertional CP x 4 days, with N/V. - No ASA given apixaban use.  - As above, needs further evaluation in ED. Discussed with her son. She is agreeable. 2. Chronic atrial fibrillation/bradycardia: She is off nodal blockade. - Continue Eliquis 5 mg bid.    - 7 day Zio (11/23) showed AF with average HR 54 bpm, with 2 3-sec pauses. Will follow. 3. HTN: Elevated today, she vomited after taking her morning meds. 4. Chronic diastolic CHF: NYHA class III symptoms, slowly progressive over time.  Mild volume overload on exam.  RHC/LHC in 4/22 showed nonobstructive CAD; mean RA 5, PA 44/10, mean PCWP 20, CI 4.89, PVR < 1 WU (pulmonary venous hypertension).  Echo in 8/22 showed EF up to 65-70%, mild LVH, normal RV, PASP 67, moderate RAE, trivial MR. PYP scan in 9/22 was probably negative. Echo in 7/23 showed EF 60-65%, mild LVH, moderate RV enlargement, normal RV systolic function, PASP 50 mmHg, mild mitral stenosis with mean gradient 4 mmHg, mild-moderate AS, IVC normal. NYHA III, she is not volume overloaded today, ReDs 32%. - Continue torsemide 60 mg q am/40 mg q pm - Continue losartan 100 mg daily.  - Continue spiro 25 mg daily. - Unable to tolerate Jardiance due to frequent UTIs.  - she has declined Cardiomems  Follow up with Dr. Aundra Dubin, as scheduled.  [place], FNP-BC 10/29/2022

## 2022-10-28 ENCOUNTER — Other Ambulatory Visit: Payer: Self-pay | Admitting: Family Medicine

## 2022-10-28 ENCOUNTER — Ambulatory Visit: Payer: Medicare Other | Admitting: Pharmacist

## 2022-10-28 ENCOUNTER — Telehealth: Payer: Self-pay | Admitting: Family Medicine

## 2022-10-28 MED ORDER — ALBUTEROL SULFATE HFA 108 (90 BASE) MCG/ACT IN AERS: 2.0000 | INHALATION_SPRAY | Freq: Four times a day (QID) | RESPIRATORY_TRACT | 2 refills | Status: DC | PRN

## 2022-10-28 NOTE — Progress Notes (Signed)
Care Management & Coordination Services Pharmacy Note  10/28/2022 Name:  [name] MRN:  [id number] DOB:  1935-06-05  Summary: PharmD FU visit.  Recent visit yesterday for sinus infection.  Started on doxycycline.  I cautioned her about her fluids and to monitor these really well for CHF exacerbation.  She is on lower dose of Eliquis now.  Needs a new rx for inhaler as her insurance will only cover a smaller package size?  Recommendations/Changes made from today's visit: Monitor fluids - call me in morning if not feeling somewhat better  Follow up plan: FU 1 month with CMA PharmD 3 month FU    Subjective: [name] is an 88 y.o. year old female who is a primary patient of [name], Cammie Mcgee, MD.  The care coordination team was consulted for assistance with disease management and care coordination needs.    Engaged with patient by telephone for follow up visit.  Consent to Services:  The patient was given the following information about Chronic Care Management services today, agreed to services, and gave verbal consent: 1. CCM service includes personalized support from designated clinical staff supervised by the primary care provider, including individualized plan of care and coordination with other care providers 2. 24/7 contact phone numbers for assistance for urgent and routine care needs. 3. Service will only be billed when office clinical staff spend 20 minutes or more in a month to coordinate care. 4. Only one practitioner [place] furnish and bill the service in a calendar month. 5.The patient Hosein stop CCM services at any time (effective at the end of the month) by phone call to the office staff. 6. The patient will be responsible for cost sharing (co-pay) of up to 20% of the service fee (after annual deductible is met). Patient agreed to services and consent obtained.   Patient Care Team: [name], MD as PCP - General (Family Medicine) [name], MD as PCP - Cardiology  (Cardiology) Larey Dresser, MD as PCP - Advanced Heart Failure (Cardiology) Edythe Clarity, [facility] as Pharmacist (Pharmacist)   Recent office visits:  10/08/21 Jenna Luo, MD - Family Medicine - Dyspnea - Labs were ordered. Reduced fluid pill back to his original dose of 80 mg a day. Follow up as scheduled.    10/05/21 Jenna Luo, MD - Dyspnea - Labs were ordered. EKG performed. Increase torsemide from 80 mg in the morning to 80 mg in the morning coupled with 40 mg in the afternoon and reassess the patient on Thursday.    09/15/21 Jenna Luo, MD - Family Medicine - Chest wall pain - predniSONE (DELTASONE) 20 MG tablet prescribed. Norco 5/325 1 p.o. every 6 hours for severe pain prescribed. Follow up if no improvement.    08/11/21 Jenna Luo, Toco were ordered. clobetasol ointment (TEMOVATE) 0.05 % prescribed. Follow up as scheduled.    07/14/21 Jenna Luo, MD - Family Medicine - General Exam - emphasized the importance of taking calcium 1200 mg daily and vitamin D 1000 units daily. Recommended both the shingles and COVID vaccine. Follow up as scheduled.      Recent consult visits:  08/26/21 Allena Katz, NP - Cardiology - CHF - Labs were ordered. No medication changes. Follow up in 3 months.      Hospital visits:  None in previous 6 months   Objective:  Lab Results  Component Value Date   CREATININE 1.59 (H) 10/15/2022   BUN 71 (H) 10/15/2022  GFR 90.44 01/27/2015   EGFR 45 (L) 09/20/2022   GFRNONAA 32 (L) 10/15/2022   GFRAA 60 07/03/2020   NA 130 (L) 10/15/2022   K 4.6 10/15/2022   CALCIUM 9.1 10/15/2022   CO2 25 10/15/2022   GLUCOSE 126 (H) 10/15/2022    Lab Results  Component Value Date/Time   HGBA1C 5.9 (H) 03/14/2014 09:43 AM   GFR 90.44 01/27/2015 11:48 AM   GFR 79.60 02/15/2014 10:34 AM    Last diabetic Eye exam: No results found for: "HMDIABEYEEXA"  Last diabetic Foot exam: No results found for: "HMDIABFOOTEX"    Lab Results  Component Value Date   CHOL 90 12/01/2021   HDL 28 (L) 12/01/2021   LDLCALC 52 12/01/2021   TRIG 49 12/01/2021   CHOLHDL 3.2 12/01/2021       Latest Ref Rng & Units 09/10/2022   12:20 PM 09/01/2022   12:14 PM 08/31/2022    3:08 PM  Hepatic Function  Total Protein 6.1 - 8.1 g/dL 7.4  8.0  8.1   Albumin 3.5 - 5.0 g/dL  4.4  4.5   AST 10 - 35 U/L 10  15  20   $ ALT 6 - 29 U/L 9  10  10   $ Alk Phosphatase 38 - 126 U/L  71  82   Total Bilirubin 0.2 - 1.2 mg/dL 0.3  0.8  1.4     Lab Results  Component Value Date/Time   TSH 3.878 12/01/2021 11:16 AM   TSH 2.20 07/08/2016 09:58 AM   TSH 1.046 02/26/2013 10:09 AM       Latest Ref Rng & Units 09/28/2022    9:02 AM 09/20/2022    3:47 PM 09/10/2022   12:20 PM  CBC  WBC 3.8 - 10.8 Thousand/uL 6.1  5.6  8.4   Hemoglobin 11.7 - 15.5 g/dL 10.7  10.3  10.3   Hematocrit 35.0 - 45.0 % 32.7  30.4  31.1   Platelets 140 - 400 Thousand/uL 247  262  293     Lab Results  Component Value Date/Time   VD25OH 33 02/09/2013 09:47 AM   VITAMINB12 379 08/11/2021 02:09 PM   [id number] 09/29/2018 12:28 PM    Clinical ASCVD: Yes  The ASCVD Risk score (Arnett DK, et al., 2019) failed to calculate for the following reasons:   The 2019 ASCVD risk score is only valid for ages 60 to 56   The patient has a prior MI or stroke diagnosis       10/21/2022   12:15 PM 08/11/2022    9:33 AM 07/26/2022    8:50 AM  Depression screen PHQ 2/9  Decreased Interest 0 0 0  Down, Depressed, Hopeless 0 0 0  PHQ - 2 Score 0 0 0  Altered sleeping  0 0  Tired, decreased energy  0 0  Change in appetite  0 0  Feeling bad or failure about yourself   0 0  Trouble concentrating  0 0  Moving slowly or fidgety/restless  0 0  Suicidal thoughts  0 0  PHQ-9 Score  0 0  Difficult doing work/chores  Not difficult at all Not difficult at all     Social History   Tobacco Use  Smoking Status Never  Smokeless Tobacco Never   BP Readings from Last 3  Encounters:  10/27/22 (!) 142/38  10/21/22 120/64  10/18/22 120/62   Pulse Readings from Last 3 Encounters:  10/27/22 (!) 51  10/21/22 (!) 51  10/18/22 70  Wt Readings from Last 3 Encounters:  10/27/22 176 lb (79.8 kg)  10/21/22 178 lb 12.8 oz (81.1 kg)  10/18/22 180 lb (81.6 kg)   BMI Readings from Last 3 Encounters:  10/27/22 31.18 kg/m  10/21/22 31.67 kg/m  10/18/22 31.89 kg/m    Allergies  Allergen Reactions   Contrast Media [Iodinated Contrast Media] Hives and Other (See Comments)    Per pt strong burning sensation starting in chest radiating outward    Clonidine Derivatives Other (See Comments)    Throat dry   Statins Other (See Comments)    "bones hurt"   Sulfa Antibiotics Diarrhea    Tremors    Celebrex [Celecoxib] Rash   Isosorbide Nitrate Itching and Rash   Other Itching and Rash    Plastic and paper tape and heart monitor pads   Tape Itching and Rash    Red Where applied and will spread    Medications Reviewed Today     Reviewed by Edythe Clarity, [facility] (Pharmacist) on 10/28/22 at 1249  Med List Status: <None>   Medication Order Taking? Sig Documenting Provider Last Dose Status Informant  acetaminophen (TYLENOL) 500 MG tablet [id number] Yes Take 1,000 mg by mouth every 6 (six) hours as needed for headache or moderate pain. [provider] Taking Active Self  albuterol (VENTOLIN HFA) 108 (90 Base) MCG/ACT inhaler [id number] Yes Inhale 2 puffs into the lungs every 6 (six) hours as needed for wheezing or shortness of breath. Rubie Maid, FNP Taking Active   amLODipine (NORVASC) 5 MG tablet [id number] Yes Take 1 tablet (5 mg total) by mouth daily. [place], Maricela Bo, FNP Taking Active Self  apixaban (ELIQUIS) 2.5 MG TABS tablet [id number] Yes Take 1 tablet (2.5 mg total) by mouth 2 (two) times daily. [name], MD Taking Active   apixaban (ELIQUIS) 5 MG TABS tablet [id number] Yes Take 1 tablet by mouth twice daily Larey Dresser, MD Taking  Active   Azelastine HCl 137 MCG/SPRAY SOLN [id number] Yes Place 1 spray into both nostrils 2 (two) times daily as needed (congestion). [provider] Taking Active Self  Cholecalciferol (VITAMIN D3) 25 MCG (1000 UT) CAPS [id number] Yes Take 1,000 Units by mouth once a week. [provider] Taking Active Self  clobetasol ointment (TEMOVATE) 0.05 % [id number] Yes Apply 1 Application topically daily as needed (irritation). [provider] Taking Active Self  clonazePAM (KLONOPIN) 0.5 MG tablet [id number] Yes Take 1 tablet by mouth twice daily as needed for anxiety [name], MD Taking Active   cloNIDine (CATAPRES) 0.1 MG tablet [id number] Yes Take 1 tablet by mouth twice daily Joette Catching, PA-C Taking Active   doxycycline (VIBRA-TABS) 100 MG tablet [id number] Yes Take 1 tablet (100 mg total) by mouth 2 (two) times daily. Rubie Maid, FNP Taking Active   famotidine (PEPCID) 20 MG tablet [id number] Yes Take 20 mg by mouth at bedtime as needed for heartburn. [provider] Taking Active Self  ferrous sulfate 325 (65 FE) MG tablet [id number] Yes Take 1 tablet (325 mg total) by mouth daily with breakfast. [name], MD Taking Active Self  fluconazole (DIFLUCAN) 150 MG tablet [id number] Yes Take 150 mg by mouth once. [provider] Taking Active   fluticasone (FLONASE) 50 MCG/ACT nasal spray [id number] Yes Place 1 spray into both nostrils daily as needed for allergies or rhinitis. [provider] Taking Active Self  levocetirizine (XYZAL ALLERGY 24HR) 5 MG tablet [id number] Yes Take 1 tablet (5  mg total) by mouth every evening. [name], MD Taking Active   losartan (COZAAR) 100 MG tablet [id number] Yes Take 1 tablet by mouth once daily Bensimhon, Shaune Pascal, MD Taking Active   methylPREDNISolone (MEDROL DOSEPAK) 4 MG TBPK tablet [id number] Yes Take by mouth. [provider] Taking Active   metolazone (ZAROXOLYN) 2.5 MG  tablet [id number]  Take 1 tablet (2.5 mg total) by mouth once for 1 dose. With 40 meq of potassium Rafael Bihari, [place]  Expired 10/11/22 2359   nitroGLYCERIN (NITROSTAT) 0.4 MG SL tablet [id number] Yes DISSOLVE ONE TABLET UNDER THE TONGUE EVERY 5 MINUTES AS NEEDED FOR CHEST PAIN.  DO NOT EXCEED A TOTAL OF 3 DOSES IN 15 MINUTES Turner, Eber Hong, MD Taking Active Self  ondansetron (ZOFRAN-ODT) 4 MG disintegrating tablet [id number] Yes Take 1 tablet (4 mg total) by mouth every 8 (eight) hours as needed for nausea or vomiting. Isla Pence, MD Taking Active   potassium chloride SA (KLOR-CON M) 20 MEQ tablet [id number]  Take 2 tablets (40 mEq total) by mouth once for 1 dose. Allena Katz [place],   Expired 10/11/22 2359   predniSONE (DELTASONE) 20 MG tablet [id number] Yes 3 tabs poqday 1-2, 2 tabs poqday 3-4, 1 tab poqday 5-6 [name], MD Taking Active   ranolazine (RANEXA) 500 MG 12 hr tablet [id number] Yes Take 500 mg by mouth as needed. [provider] Taking Active   spironolactone (ALDACTONE) 25 MG tablet [id number] Yes Take 1 tablet by mouth once daily Turner, Eber Hong, MD Taking Active   torsemide (DEMADEX) 20 MG tablet [id number] Yes Take 4 tablets (80 mg total) by mouth every morning AND 3 tablets (60 mg total) every evening. [place], Maricela Bo, FNP Taking Active             SDOH:  (Social Determinants of Health) assessments and interventions performed: No Financial Resource Strain: Low Risk  (08/11/2022)   Overall Financial Resource Strain (CARDIA)    Difficulty of Paying Living Expenses: Not hard at all   Food Insecurity: No Food Insecurity (09/03/2022)   Hunger Vital Sign    Worried About Running Out of Food in the Last Year: Never true    Ran Out of Food in the Last Year: Never true    SDOH Interventions    Flowsheet Row Telephone from 09/03/2022 in [place] from 08/11/2022 in [place] Interventions    Food Insecurity Interventions Intervention Not Indicated Intervention Not Indicated  Housing Interventions Intervention Not Indicated Intervention Not Indicated  Transportation Interventions -- Intervention Not Indicated  Utilities Interventions -- Intervention Not Indicated  Alcohol Usage Interventions -- Intervention Not Indicated (Score <7)  Financial Strain Interventions -- Intervention Not Indicated  Physical Activity Interventions -- Intervention Not Indicated  Stress Interventions -- Intervention Not Indicated  Social Connections Interventions -- Intervention Not Indicated       Medication Assistance: None required.  Patient affirms current coverage meets needs.  Medication Access: Within the past 30 days, how often has patient missed a dose of medication? 0 Is a pillbox or other method used to improve adherence? Yes  Factors that Mara affect medication adherence? no barriers identified Are meds synced by current pharmacy? No  Are meds delivered by current pharmacy? No  Does patient experience delays in picking up medications due to transportation concerns? No   [facility] Services Reviewed: Is patient disadvantaged to use [facility]?: Yes  Current Rx insurance plan: [place] Name and location of Current pharmacy:  [place], [place] [address] Phone: [phone] Fax: [phone]  [place], [place] - [address] [place] [address] Phone: [phone] Fax: [phone]  [facility] services reviewed with patient today?: Yes  Patient requests to transfer care to [facility]?: No  Reason patient declined to change pharmacies: Disadvantaged due to insurance/mail order  Compliance/Adherence/Medication fill history: Losartan 100 mg last filled 09/25/2022 90 DS     Care Gaps: Annual wellness visit in last year?  Yes   Assessment/Plan     Hypertension (BP goal <140/90) -Not ideally controlled -Current treatment: Amlodipine 84m daily Appropriate, Effective, Safe, Accessible Clonidine 0.147mBID  Appropriate, Effective, Safe, Accessible Losartan 10016maily  Appropriate, Effective, Safe, Accessible Spironolactone 68m68mily  Appropriate, Effective, Safe, Accessible -Medications previously tried: metoprolol, valsartan  -Current home readings: 182/73 P 62, 165/62 P 61 -Current exercise habits: minimal due to having to take care of her husband -Denies hypotensive/hypertensive symptoms -Educated on BP goals and benefits of medications for prevention of heart attack, stroke and kidney damage; Daily salt intake goal < 2300 mg; Exercise goal of 150 minutes per week; Importance of home blood pressure monitoring; -Counseled to monitor BP at home weekly, document, and provide log at future appointments -Recommended to continue current medication Have asked patient to monitor and record, will f/u on BP readings next month  Update 12/24/21 BP elevated last time in the office.  The two times before that her BP was a little bit on the lower side.  No monitoring much at home. If BP remains elevated could consider increasing amlodipine to 10mg33mly. At this time would make not changes just continue to monitor.  Hyperlipidemia/CAD: (LDL goal < 70) -Controlled -Current treatment: None -Medications previously tried: none noted  -LDL is normal at last lipid panel -Educated on Cholesterol goals;  Importance of limiting foods high in cholesterol; Exercise goal of 150 minutes per week; -Recommended to continue current medication  Chest Pain (Goal: Minimize symptoms) 07/01/22 -Query Controlled -Current treatment  Ranolazine 500mg 64m bid Appropriate, Query effective, -Medications previously tried: none noted -Recently started for chest pain, unclear if it is working she is still having some chest pain   -Recommended to continue current medication -Continue to monitor  Heart Failure (Goal: manage symptoms and prevent exacerbations) 10/28/22 -Controlled -Last ejection fraction: 60% (Date: 09/08/2018) -HF type: HFpEF (EF > 50%) -NYHA Class: II (slight limitation of activity) -AHA HF Stage: C (Heart disease and symptoms present) -Current treatment: Torsemide 20mg 462m and 3 qpm Appropriate, Effective, Safe, Accessible Spironolactone 68mg Ap3mriate, Effective, Safe, Accessible Losartan 100mg App72miate, Effective, Safe, Accessible -Medications previously tried: JardianceGeneticist, molecular-Current home BP/HR readings: controlled -Current home daily weights: steady, she is wearing compression hose  -Educated on Benefits of medications for managing symptoms and prolonging life Importance of weighing daily; if you gain more than 3 pounds in one day or 5 pounds in one week, contact providers -Recommended to continue current medication Was in the office yesterday and negative for CHF exacerbation.  She started on doxycycline.  Not feeling much better does report some wheezing.  I asked her to monitor her fluids really well and to call me if it gets worse or she is not feeling somewhat better in the morning.  She is agreeable to plan.   Atrial Fibrillation (Goal: prevent  stroke and major bleeding) 10/28/22 -Controlled -CHADSVASC: 8 -Current treatment: Rate control: none Anticoagulation: Eliquis 2.56m BID Appropriate, Effective, Safe, Accessible -Medications previously tried: metoprolol -Home BP and HR readings: HR is well controlled  -Counseled on increased risk of stroke due to Afib and benefits of anticoagulation for stroke prevention; bleeding risk associated with Eliquis and importance of self-monitoring for signs/symptoms of bleeding; seeking medical attention after a head injury or if there is blood in the urine/stool; Hgb has improved slightly since changing the dose. No changes -  continue decreased dose.  Update 12/24/21 Eliquis dose still appropriate based on most recent Scr.  Her weight remains above level to decrease dose as well. No changes at this time, continue to monitor renal function for need to adjust dose. Patient followed regularly by cardiology.   Osteoporosis / Osteopenia (Goal Prevent fractures/maintain bone density) -Not ideally controlled, not assessed -Last DEXA Scan: 02/04/21   T-Score femoral neck: -3.0  T-Score lumbar spine: -3.0 -Patient is a candidate for pharmacologic treatment due to T-Score < -2.5 in lumbar spine -Current treatment  None -Medications previously tried: Fosamax (throat irritation), Prolia (unclear if she started due to $$)  -Recommend [phone] units of vitamin D daily. Recommend 1200 mg of calcium daily from dietary and supplemental sources. Recommend weight-bearing and muscle strengthening exercises for building and maintaining bone density. -Patient is currently having some jaw pain.  She took one dose of Fosamax and her jaw pain started about 4 weeks later.  Advised her to see a dentist to see if she can get X-rays of the area her pain is.  This could also be contributing to her increase in BP.  Would recommend resolving jaw pain before discussing treatment options for osteoporosis.  Patient agreeable to this.              CBeverly Milch PharmD, CPP Clinical Pharmacist Practitioner BMount [phone]

## 2022-10-28 NOTE — Telephone Encounter (Signed)
Patient called to follow up on refill request for albuterol (VENTOLIN HFA) 108 (90 Base) MCG/ACT inhaler [id number]  Stated her insurance company will not honor current script on file and will only pay for a smaller size. Pharmacy needs a new script. Patient unsure of the exact size it needs to be.   Pharmacy confirmed as:  [facility] [address], [place] - [place] [address] [place] [address] Phone: [phone]  Fax: [phone]   Please advise at [phone].

## 2022-10-29 ENCOUNTER — Encounter (HOSPITAL_COMMUNITY): Payer: Self-pay

## 2022-10-29 ENCOUNTER — Emergency Department (HOSPITAL_COMMUNITY)
Admission: EM | Admit: 2022-10-29 | Discharge: 2022-10-29 | Disposition: A | Discharge status: Home / Self Care | Payer: Medicare Other | Attending: Emergency Medicine | Admitting: Emergency Medicine

## 2022-10-29 ENCOUNTER — Ambulatory Visit (HOSPITAL_COMMUNITY)
Admission: RE | Admit: 2022-10-29 | Discharge: 2022-10-29 | Disposition: A | Discharge status: Home / Self Care | Payer: Medicare Other | Source: Ambulatory Visit | Attending: Family Medicine | Admitting: Family Medicine

## 2022-10-29 ENCOUNTER — Other Ambulatory Visit: Payer: Self-pay

## 2022-10-29 ENCOUNTER — Emergency Department (HOSPITAL_COMMUNITY): Payer: Medicare Other

## 2022-10-29 ENCOUNTER — Telehealth: Payer: Self-pay

## 2022-10-29 VITALS — BP 178/66 | HR 63 | Wt 174.0 lb

## 2022-10-29 DIAGNOSIS — I130 Hypertensive heart and chronic kidney disease with heart failure and stage 1 through stage 4 chronic kidney disease, or unspecified chronic kidney disease: Secondary | ICD-10-CM | POA: Diagnosis not present

## 2022-10-29 DIAGNOSIS — I482 Chronic atrial fibrillation, unspecified: Secondary | ICD-10-CM

## 2022-10-29 DIAGNOSIS — J449 Chronic obstructive pulmonary disease, unspecified: Secondary | ICD-10-CM | POA: Insufficient documentation

## 2022-10-29 DIAGNOSIS — N1832 Chronic kidney disease, stage 3b: Secondary | ICD-10-CM | POA: Diagnosis not present

## 2022-10-29 DIAGNOSIS — Z7951 Long term (current) use of inhaled steroids: Secondary | ICD-10-CM | POA: Diagnosis not present

## 2022-10-29 DIAGNOSIS — I2510 Atherosclerotic heart disease of native coronary artery without angina pectoris: Secondary | ICD-10-CM

## 2022-10-29 DIAGNOSIS — R079 Chest pain, unspecified: Secondary | ICD-10-CM | POA: Diagnosis not present

## 2022-10-29 DIAGNOSIS — Z79899 Other long term (current) drug therapy: Secondary | ICD-10-CM | POA: Diagnosis not present

## 2022-10-29 DIAGNOSIS — I5032 Chronic diastolic (congestive) heart failure: Secondary | ICD-10-CM | POA: Insufficient documentation

## 2022-10-29 DIAGNOSIS — Z7901 Long term (current) use of anticoagulants: Secondary | ICD-10-CM | POA: Diagnosis not present

## 2022-10-29 DIAGNOSIS — I10 Essential (primary) hypertension: Secondary | ICD-10-CM

## 2022-10-29 DIAGNOSIS — R0602 Shortness of breath: Secondary | ICD-10-CM | POA: Diagnosis not present

## 2022-10-29 DIAGNOSIS — R0789 Other chest pain: Secondary | ICD-10-CM | POA: Diagnosis not present

## 2022-10-29 DIAGNOSIS — I5022 Chronic systolic (congestive) heart failure: Secondary | ICD-10-CM

## 2022-10-29 LAB — HEPATIC FUNCTION PANEL
ALT: 11 U/L (ref 0–44)
AST: 18 U/L (ref 15–41)
Albumin: 3.9 g/dL (ref 3.5–5.0)
Alkaline Phosphatase: 89 U/L (ref 38–126)
Bilirubin, Direct: 0.1 mg/dL (ref 0.0–0.2)
Indirect Bilirubin: 0.8 mg/dL (ref 0.3–0.9)
Total Bilirubin: 0.9 mg/dL (ref 0.3–1.2)
Total Protein: 7.3 g/dL (ref 6.5–8.1)

## 2022-10-29 LAB — TROPONIN I (HIGH SENSITIVITY)
Troponin I (High Sensitivity): 19 ng/L — ABNORMAL HIGH (ref <18)
Troponin I (High Sensitivity): 21 ng/L — ABNORMAL HIGH (ref <18)

## 2022-10-29 LAB — CBC
HCT: 33.8 % — ABNORMAL LOW (ref 36.0–46.0)
Hemoglobin: 10.3 g/dL — ABNORMAL LOW (ref 12.0–15.0)
MCH: 30.3 pg (ref 26.0–34.0)
MCHC: 30.5 g/dL (ref 30.0–36.0)
MCV: 99.4 fL (ref 80.0–100.0)
Platelets: 226 10*3/uL (ref 150–400)
RBC: 3.4 MIL/uL — ABNORMAL LOW (ref 3.87–5.11)
RDW: 14.7 % (ref 11.5–15.5)
WBC: 5.2 10*3/uL (ref 4.0–10.5)
nRBC: 0 % (ref 0.0–0.2)

## 2022-10-29 LAB — TYPE AND SCREEN
ABO/RH(D): O POS
Antibody Screen: NEGATIVE

## 2022-10-29 LAB — BASIC METABOLIC PANEL
Anion gap: 13 (ref 5–15)
BUN: 30 mg/dL — ABNORMAL HIGH (ref 8–23)
CO2: 27 mmol/L (ref 22–32)
Calcium: 9.8 mg/dL (ref 8.9–10.3)
Chloride: 92 mmol/L — ABNORMAL LOW (ref 98–111)
Creatinine, Ser: 1.07 mg/dL — ABNORMAL HIGH (ref 0.44–1.00)
GFR, Estimated: 51 mL/min — ABNORMAL LOW (ref >60)
Glucose, Bld: 112 mg/dL — ABNORMAL HIGH (ref 70–99)
Potassium: 4 mmol/L (ref 3.5–5.1)
Sodium: 132 mmol/L — ABNORMAL LOW (ref 135–145)

## 2022-10-29 LAB — BRAIN NATRIURETIC PEPTIDE: B Natriuretic Peptide: 136.4 pg/mL — ABNORMAL HIGH (ref 0.0–100.0)

## 2022-10-29 MED ORDER — LOSARTAN POTASSIUM 50 MG PO TABS
100.0000 mg | ORAL_TABLET | Freq: Once | ORAL | Status: AC
  Administered 2022-10-29: 100 mg via ORAL
  Filled 2022-10-29: qty 2

## 2022-10-29 MED ORDER — CLONIDINE HCL 0.1 MG PO TABS
0.1000 mg | ORAL_TABLET | Freq: Once | ORAL | Status: AC
  Administered 2022-10-29: 0.1 mg via ORAL
  Filled 2022-10-29: qty 1

## 2022-10-29 NOTE — ED Provider Notes (Signed)
[name]   CSN: [id number] Arrival date & time: 10/29/22  1224     History Chief Complaint  Patient presents with   Chest Pain    [name] is a 88 y.o. female.  Patient with past history significant for permanent atrial fibrillation, pulmonary hypertension, Takotsubo cardiomyopathy, COPD presents to the emergency department complaints of chest pain and shortness of breath.  Patient reports that symptoms began approximately 4 days ago and been persistent since then with any significant improvement.  Patient reports that she is followed at the heart failure clinic by Allena Katz, [place].  She reports that she attempted to take all her medications earlier today including losartan, spironolactone, torsemide but she vomited these medications that she began to feel sick earlier.  Patient is reporting associated nausea, vomiting, exertional dysuria, but denies any significant abdominal pain.  Patient is also concerned she has had dark stools for the last several days but no active bleeding noted.   Chest Pain Associated symptoms: shortness of breath   Associated symptoms: no fever        Home Medications Prior to Admission medications   Medication Sig Start Date End Date Taking? Authorizing Provider  acetaminophen (TYLENOL) 500 MG tablet Take 1,000 mg by mouth every 6 (six) hours as needed for headache or moderate pain.    [provider]  albuterol (VENTOLIN HFA) 108 (90 Base) MCG/ACT inhaler Inhale 2 puffs into the lungs every 6 (six) hours as needed for wheezing or shortness of breath. 10/27/22   Rubie Maid, FNP  amLODipine (NORVASC) 5 MG tablet Take 1 tablet (5 mg total) by mouth daily. 01/01/22   [place], Maricela Bo, FNP  apixaban (ELIQUIS) 2.5 MG TABS tablet Take 1 tablet (2.5 mg total) by mouth 2 (two) times daily. 09/17/22   [name], MD  Azelastine HCl 137 MCG/SPRAY SOLN Place 1 spray into both nostrils  2 (two) times daily as needed (congestion). 06/10/21   [provider]  Cholecalciferol (VITAMIN D3) 25 MCG (1000 UT) CAPS Take 1,000 Units by mouth once a week.    [provider]  clobetasol ointment (TEMOVATE) [id number] % Apply 1 Application topically daily as needed (irritation).    [provider]  clonazePAM (KLONOPIN) 0.5 MG tablet Take 1 tablet by mouth twice daily as needed for anxiety 10/08/22   [name], MD  cloNIDine (CATAPRES) 0.1 MG tablet Take 1 tablet by mouth twice daily 10/13/22   Joette Catching, PA-C  doxycycline (VIBRA-TABS) 100 MG tablet Take 1 tablet (100 mg total) by mouth 2 (two) times daily. Patient taking differently: Take 100 mg by mouth daily. 10/27/22   Rubie Maid, FNP  famotidine (PEPCID) 20 MG tablet Take 20 mg by mouth at bedtime as needed for heartburn.    [provider]  ferrous sulfate 325 (65 FE) MG tablet Take 1 tablet (325 mg total) by mouth daily with breakfast. 07/04/20   [name], MD  fluticasone (FLONASE) 50 MCG/ACT nasal spray Place 1 spray into both nostrils daily as needed for allergies or rhinitis.    [provider]  levocetirizine (XYZAL ALLERGY 24HR) 5 MG tablet Take 1 tablet (5 mg total) by mouth every evening. 06/01/22   [name], MD  losartan (COZAAR) 100 MG tablet Take 1 tablet by mouth once daily 09/24/22   Bensimhon, Shaune Pascal, MD  nitroGLYCERIN (NITROSTAT) 0.4 MG SL tablet DISSOLVE ONE TABLET  UNDER THE TONGUE EVERY 5 MINUTES AS NEEDED FOR CHEST PAIN.  DO NOT EXCEED A TOTAL OF 3 DOSES IN 15 MINUTES 11/06/21   Turner, Eber Hong, MD  ondansetron (ZOFRAN-ODT) 4 MG disintegrating tablet Take 1 tablet (4 mg total) by mouth every 8 (eight) hours as needed for nausea or vomiting. 09/01/22   Isla Pence, MD  predniSONE (DELTASONE) 20 MG tablet 3 tabs poqday 1-2, 2 tabs poqday 3-4, 1 tab poqday 5-6 09/20/22   [name], MD  ranolazine (RANEXA) 500 MG 12 hr tablet Take 500 mg by  mouth as needed.    [provider]  spironolactone (ALDACTONE) 25 MG tablet Take 1 tablet by mouth once daily 09/10/22   [name], MD  torsemide (DEMADEX) 20 MG tablet Take 4 tablets (80 mg total) by mouth every morning AND 3 tablets (60 mg total) every evening. 08/25/22   Rafael Bihari, FNP      Allergies    Contrast media [iodinated contrast media], Clonidine derivatives, Statins, Sulfa antibiotics, Celebrex [celecoxib], Isosorbide nitrate, Other, and Tape    Review of Systems   Review of Systems  Constitutional:  Negative for fever.  Respiratory:  Positive for shortness of breath.   Cardiovascular:  Positive for chest pain.  Gastrointestinal:  Positive for constipation.  All other systems reviewed and are negative.   Physical Exam Updated Vital Signs BP (!) 183/63   Pulse 65   Temp 97.7 F (36.5 C) (Oral)   Resp (!) 21   Ht '5\' 3"'$  (1.6 m)   Wt 78.9 kg   SpO2 93%   BMI 30.82 kg/m  Physical Exam Vitals and nursing note reviewed.  Constitutional:      Appearance: She is well-developed.  HENT:     Head: Normocephalic and atraumatic.  Cardiovascular:     Rate and Rhythm: Normal rate. Rhythm irregular.     Comments: Afib by HR 60-80 at baseline Pulmonary:     Effort: Pulmonary effort is normal. No tachypnea or respiratory distress.     Breath sounds: Wheezing present. No decreased breath sounds.     Comments: Minor diffuse wheezing on auscultation. Patient not on O2 Musculoskeletal:     Cervical back: Normal range of motion and neck supple.  Skin:    General: Skin is warm and dry.     Capillary Refill: Capillary refill takes less than 2 seconds.  Neurological:     General: No focal deficit present.     Mental Status: She is alert.     ED Results / Procedures / Treatments   Labs (all labs ordered are listed, but only abnormal results are displayed) Labs Reviewed  BASIC METABOLIC PANEL - Abnormal; Notable for the following components:       Result Value   Sodium 132 (*)    Chloride 92 (*)    Glucose, Bld 112 (*)    BUN 30 (*)    Creatinine, Ser 1.07 (*)    GFR, Estimated 51 (*)    All other components within normal limits  CBC - Abnormal; Notable for the following components:   RBC 3.40 (*)    Hemoglobin 10.3 (*)    HCT 33.8 (*)    All other components within normal limits  BRAIN NATRIURETIC PEPTIDE - Abnormal; Notable for the following components:   B Natriuretic Peptide 136.4 (*)    All other components within normal limits  TROPONIN I (HIGH SENSITIVITY) - Abnormal; Notable for the following components:  Troponin I (High Sensitivity) 19 (*)    All other components within normal limits  TROPONIN I (HIGH SENSITIVITY) - Abnormal; Notable for the following components:   Troponin I (High Sensitivity) 21 (*)    All other components within normal limits  HEPATIC FUNCTION PANEL  TYPE AND SCREEN    EKG EKG Interpretation  Date/Time:  Friday October 29 2022 12:32:59 EST Ventricular Rate:  68 PR Interval:    QRS Duration: 98 QT Interval:  436 QTC Calculation: 463 R Axis:   -9 Text Interpretation: Atrial fibrillation Moderate voltage criteria for LVH, Strehl be normal variant ( R in aVL , Cornell product ) Possible Anterior infarct , age undetermined Abnormal ECG When compared with ECG of 31-Aug-2022 15:08, PREVIOUS ECG IS PRESENT Confirmed by Dene Gentry [id number]) on 10/29/2022 3:33:02 PM  Radiology DG Chest 2 View  Result Date: 10/29/2022 CLINICAL DATA:  Chest pain EXAM: CHEST - 2 VIEW COMPARISON:  Chest x-ray 03/16/2022 x-ray and CT angiogram FINDINGS: Enlarged cardiopericardial silhouette with calcified tortuous aorta. Hyperinflation. Diffuse interstitial changes are again noted and likely chronic. No pneumothorax, effusion or consolidation. Osteopenia. Degenerative changes IMPRESSION: Hyperinflation with likely chronic interstitial changes. Enlarged cardiopericardial silhouette Electronically Signed   By: Jill Side  M.D.   On: 10/29/2022 13:03    Procedures Procedures   Medications Ordered in ED Medications  losartan (COZAAR) tablet 100 mg (100 mg Oral Given 10/29/22 1705)  cloNIDine (CATAPRES) tablet 0.1 mg (0.1 mg Oral Given 10/29/22 1705)    ED Course/ Medical Decision Making/ A&P Clinical Course as of 10/29/22 1706  Fri Oct 29, 2022  1654 BP(!): 192/101 [OZ]    Clinical Course User Index [OZ] Luvenia Heller, PA-C                           Medical Decision Making Amount and/or Complexity of Data Reviewed Labs: ordered. Radiology: ordered.   This patient presents to the ED for concern of chest tightness and shortness of breath.  Differential diagnosis includes CHF exacerbation, COPD exacerbation, MI, pulmonary medicine, pneumothorax    Additional history obtained:  Additional history obtained from cardiology progress note from earlier today from Allena Katz, FNP  Lab Tests:  I Ordered, and personally interpreted labs.  The pertinent results include: Initial troponin of 18 with repeat troponin of 21.  BMP had baseline at 136, CBC and BMP at patient's baseline, hepatic function panel   Imaging Studies ordered:  I ordered imaging studies including chest x-ray I independently visualized and interpreted imaging which showed hyperinflation but no acute cardiopulmonary process I agree with the radiologist interpretation   Medicines ordered and prescription drug management:  I ordered medication including losartan, clonidine for hypertension Reevaluation of the patient after these medicines showed that the patient improved I have reviewed the patients home medicines and have made adjustments as needed   Problem List / ED Course:  Patient presents emergency department complaints of chest tightness and shortness of breath over the last 4 days which has been worsening.  Patient reports that she was seen by the cardiologist earlier today for similar complaints and was advised to  come to the emergency room for further evaluation.  I contacted the heart failure service asking about this patient and their plan of action at this time, they reported to me that they wanted to ensure the patient was not having any active ACS with delta troponins and EKG to feel comfortable discharging patient home  with further workup or evaluation as needed.  Based on troponins and EKG, patient appears to be at baseline without any significant elevation in levels.  Likely not ACS in nature.  Given the patient is rate controlled with A-fib on EKG typically ranging rhythm between 60 to 80 bpm, cannot believe patient requires any further intervention or evaluation here in the emergency department.  Patient does have a prior history of COPD based on chart review patient reports that she is not currently on any home O2 or on any medications at home for the COPD with the exception of albuterol as needed for any breathing exacerbations.  Advised patient that she should plan to follow-up with pulmonology for further evaluation and assessment as this does appear that this could be COPD related given that there was no acute findings on her current workup the exception of hyperinflation seen on chest x-ray which is indicative of COPD.  Patient agreeable with treatment plan and understands all return precautions.  All questions answered prior to patient discharge.   Social Determinants of Health:  Multiple significant comorbidities including COPD, chronic kidney disease stage IIIb, persistent atrial fibrillation, chronic diastolic CHF as well as others  Final Clinical Impression(s) / ED Diagnoses Final diagnoses:  Other chest pain  Shortness of breath    Rx / DC Orders ED Discharge Orders     None         Vladimir Creeks 10/29/22 1706    Valarie Merino, MD 10/29/22 [phone]

## 2022-10-29 NOTE — ED Provider Triage Note (Signed)
Emergency Medicine Provider Triage Evaluation Note  [name] , a 88 y.o. female  was evaluated in triage.  Pt complains of chest tightness, abdominal pain, nausea, vomiting.  Patient denies any hemoptysis but she does endorse some dark, tarry stool as well for the last several days.  She denies any previous history of GI bleed.  She takes Eliquis for A-fib.  She denies exertional component of chest pain, but she does endorse some shortness of breath with exertion.  Review of Systems  Positive: Chest pain, shob, dark stool Negative: Fever, chills  Physical Exam  BP (!) 192/101 (BP Location: Right Arm)   Pulse 68   Temp 98.7 F (37.1 C) (Oral)   Resp 16   Ht '5\' 3"'$  (1.6 m)   Wt 78.9 kg   SpO2 95%   BMI 30.82 kg/m  Gen:   Awake, no distress   Resp:  Normal effort  MSK:   Moves extremities without difficulty  Other:  Mild ttp epigastric region  Medical Decision Making  Medically screening exam initiated at 12:47 PM.  Appropriate orders placed.  [name] was informed that the remainder of the evaluation will be completed by another provider, this initial triage assessment does not replace that evaluation, and the importance of remaining in the ED until their evaluation is complete.  Workup initiated in triage    Anselmo Pickler, PA-C 10/29/22 1249

## 2022-10-29 NOTE — ED Triage Notes (Signed)
Pt to ED from heart failure clinic c/o chest pain and SHOB. x 4 days, pt also c/o nausea this morning. Hx afib, CHF.

## 2022-10-29 NOTE — Discharge Instructions (Addendum)
You were seen in the ER for chest pain and shortness of breath. Thankfully your labs, EKG, and imaging were reassuring and largely at your baseline. This does not appear to be caused by a cardiac condition at this time, but ensure you are staying on top of your medications for heart failure to avoid worsening of your symptoms. Please return to the ER if your shortness of breath or chest tightness worsens before you are able to follow up with your primary care provider or cardiology. Please reach out to the pulmonology office I have attached to your paperwork for evaluation.

## 2022-10-29 NOTE — Telephone Encounter (Signed)
Pt called c/o chest tightness and vomiting. Advised patient, per Dr. Samella Parr order that she needs to go to the ER to be evaluated. Pt advised and states, "I just can't go to the Emergency Room. I have a Cardiologist appointment at 19 and I will try to go there."

## 2022-10-29 NOTE — Progress Notes (Signed)
ReDS Vest / Clip - 10/29/22 1200       ReDS Vest / Clip   Station Marker A    Ruler Value 31.5    ReDS Value Range Low volume    ReDS Actual Value 32

## 2022-10-30 DIAGNOSIS — E669 Obesity, unspecified: Secondary | ICD-10-CM | POA: Diagnosis not present

## 2022-10-30 DIAGNOSIS — I509 Heart failure, unspecified: Secondary | ICD-10-CM | POA: Diagnosis not present

## 2022-10-30 DIAGNOSIS — G8929 Other chronic pain: Secondary | ICD-10-CM | POA: Diagnosis not present

## 2022-10-30 DIAGNOSIS — I4891 Unspecified atrial fibrillation: Secondary | ICD-10-CM | POA: Diagnosis not present

## 2022-10-30 DIAGNOSIS — Z9181 History of falling: Secondary | ICD-10-CM | POA: Diagnosis not present

## 2022-10-30 DIAGNOSIS — Z8673 Personal history of transient ischemic attack (TIA), and cerebral infarction without residual deficits: Secondary | ICD-10-CM | POA: Diagnosis not present

## 2022-10-30 DIAGNOSIS — I110 Hypertensive heart disease with heart failure: Secondary | ICD-10-CM | POA: Diagnosis not present

## 2022-10-30 DIAGNOSIS — M545 Low back pain, unspecified: Secondary | ICD-10-CM | POA: Diagnosis not present

## 2022-10-30 DIAGNOSIS — E785 Hyperlipidemia, unspecified: Secondary | ICD-10-CM | POA: Diagnosis not present

## 2022-10-30 DIAGNOSIS — K219 Gastro-esophageal reflux disease without esophagitis: Secondary | ICD-10-CM | POA: Diagnosis not present

## 2022-10-30 DIAGNOSIS — Z6831 Body mass index (BMI) 31.0-31.9, adult: Secondary | ICD-10-CM | POA: Diagnosis not present

## 2022-10-30 DIAGNOSIS — C50919 Malignant neoplasm of unspecified site of unspecified female breast: Secondary | ICD-10-CM | POA: Diagnosis not present

## 2022-10-30 DIAGNOSIS — Z7901 Long term (current) use of anticoagulants: Secondary | ICD-10-CM | POA: Diagnosis not present

## 2022-11-01 DIAGNOSIS — G8929 Other chronic pain: Secondary | ICD-10-CM | POA: Diagnosis not present

## 2022-11-01 DIAGNOSIS — I110 Hypertensive heart disease with heart failure: Secondary | ICD-10-CM | POA: Diagnosis not present

## 2022-11-01 DIAGNOSIS — I4891 Unspecified atrial fibrillation: Secondary | ICD-10-CM | POA: Diagnosis not present

## 2022-11-01 DIAGNOSIS — I509 Heart failure, unspecified: Secondary | ICD-10-CM | POA: Diagnosis not present

## 2022-11-02 ENCOUNTER — Encounter: Payer: Self-pay | Admitting: *Deleted

## 2022-11-02 ENCOUNTER — Telehealth: Payer: Self-pay | Admitting: *Deleted

## 2022-11-02 DIAGNOSIS — Z8673 Personal history of transient ischemic attack (TIA), and cerebral infarction without residual deficits: Secondary | ICD-10-CM | POA: Diagnosis not present

## 2022-11-02 DIAGNOSIS — C50919 Malignant neoplasm of unspecified site of unspecified female breast: Secondary | ICD-10-CM | POA: Diagnosis not present

## 2022-11-02 DIAGNOSIS — I110 Hypertensive heart disease with heart failure: Secondary | ICD-10-CM | POA: Diagnosis not present

## 2022-11-02 DIAGNOSIS — E785 Hyperlipidemia, unspecified: Secondary | ICD-10-CM | POA: Diagnosis not present

## 2022-11-02 DIAGNOSIS — M545 Low back pain, unspecified: Secondary | ICD-10-CM | POA: Diagnosis not present

## 2022-11-02 DIAGNOSIS — K219 Gastro-esophageal reflux disease without esophagitis: Secondary | ICD-10-CM | POA: Diagnosis not present

## 2022-11-02 DIAGNOSIS — E669 Obesity, unspecified: Secondary | ICD-10-CM | POA: Diagnosis not present

## 2022-11-02 DIAGNOSIS — G8929 Other chronic pain: Secondary | ICD-10-CM | POA: Diagnosis not present

## 2022-11-02 DIAGNOSIS — I4891 Unspecified atrial fibrillation: Secondary | ICD-10-CM | POA: Diagnosis not present

## 2022-11-02 DIAGNOSIS — Z6831 Body mass index (BMI) 31.0-31.9, adult: Secondary | ICD-10-CM | POA: Diagnosis not present

## 2022-11-02 DIAGNOSIS — I509 Heart failure, unspecified: Secondary | ICD-10-CM | POA: Diagnosis not present

## 2022-11-02 DIAGNOSIS — Z7901 Long term (current) use of anticoagulants: Secondary | ICD-10-CM | POA: Diagnosis not present

## 2022-11-02 DIAGNOSIS — Z9181 History of falling: Secondary | ICD-10-CM | POA: Diagnosis not present

## 2022-11-02 NOTE — Progress Notes (Signed)
Cardiology Office Note:    Date:  11/09/2022   ID:  [name], DOB 1935/06/08, MRN [id number]  PCP:  [name], MD  [facility] Cardiologist:  Fransico Him, MD Advanced Heart Failure:  Loralie Champagne, MD     Referring MD: [name], MD   Chief Complaint:  No chief complaint on file.     History of Present Illness:   [name] is a 88 y.o. female with  history of poorly controlled HTN, chronic atrial fibrillation, chronic diastolic CHF, and prior episode of Takotsubo cardiomyopathy. Patient has had exertional dyspnea since 11/16.  At that time, she presented to the hospital with chest pain.  Coronary angiography showed no significant CAD.  EF was 35-40% by echo, suspected Takotsubo cardiomyopathy.  Her echo in 3/17 showed EF improved to 123456 but PA systolic pressure elevated at 65 mmHg.     RHC/LHC in 4/22 showed nonobstructive CAD; mean RA 5, PA 44/10, mean PCWP 20, CI 4.89, PVR < 1 WU.  Echo in 8/22 showed EF up to 65-70%, mild LVH, normal RV, PASP 67, moderate RAE, trivial MR. PYP scan in 9/22 was probably negative. Patient was admitted in 7/23 with chest pain, she had a mild elevation in troponin with no trend.  This was thought to be due to demand ischemia with volume overload rather than ACS.  Echo then showed EF 60-65%, mild LVH, moderate RV enlargement, normal RV systolic function, PASP 50 mmHg, mild mitral stenosis with mean gradient 4 mmHg, mild-moderate AS, IVC normal.   Patient was admitted in 7/23 with chest pain, she had a mild elevation in troponin with no trend.  This was thought to be due to demand ischemia with volume overload rather than ACS.  Echo this admission showed EF 60-65%, mild LVH, moderate RV enlargement, normal RV systolic function, PASP 50 mmHg, mild mitral stenosis with mean gradient 4 mmHg, mild-moderate AS, IVC normal.    7 day Zio 11/23 showed atrial fibrillation, average HR 54 bpm with 2 3-second pauses.   She was seen in  [facility] 10/29/22 with 4 days of chest pain N/V, SOB, LEE. She then went to ED later that day and EKG and trops ok. Plan to f/u with pulmonary.   Patient comes in for f/u. Complains of thick phlegm that she coughs up and causes chest pain. Sees pulmonary on Monday. Feels like her lungs are filling up. Leg swelling is worse. Was wheezing this am better after inhaler.     Past Medical History:  Diagnosis Date   Allergy    rhinitis   Aortic stenosis    mild by echo 06/2017   Arthritis    Bradycardia    a. 10/2017 -> beta blocker cut back due to HR 39.   Breast cancer ([facility]) 01/06/2012   Cancer ([place])    right colon and left breast   Chronic diastolic CHF (congestive heart failure) ([place])    Colon cancer ([place]) 01/06/2012   Colovesical fistula    Dr. Marlou Starks and [name] planning surgery 08/2018- surgery revealed spontaneous closure   COPD (chronic obstructive pulmonary disease) ([facility])    pt. denies   Coronary artery disease 2006   a.  NSTEMI in 2016, cath showed 15% prox-mid RCA, 20% prox LAD, EF 25-35% by cath and 35-40% -> felt due to Takotsubo cardiomyopathy.   Dilated aortic root ([place])    32mHg by echo 06/2017   Diverticulosis    Dyspnea  Edema extremities    GERD (gastroesophageal reflux disease)    Hernia    Hiatal hernia    denies   Hyperlipidemia    Hypertension    Mild aortic stenosis    echo 11/2015 but not noted on echo 06/2016   Osteopenia    Permanent atrial fibrillation ([facility])    chronic atrial fibrillation   Pneumonia    hx child   Pulmonary HTN ([place])    a. moderate to severe PASP 10mHg echo 11/2015 - now 411mg by echo 06/2017. CTA chest in 11/16 with no PE. PFTs in 7/15 with mild obstructive lung disease. She had a negative sleep study in 2017. b. Felt due to left sided HF.   [facility]   Takotsubo syndrome 07/29/2015   a. EF 35-40% by echo; akinesis of mid-apical anteroseptal and apical myocardium.  EF now normalized on echo 11/2015   Current Medications: Current  Meds  Medication Sig   acetaminophen (TYLENOL) 500 MG tablet Take 1,000 mg by mouth every 6 (six) hours as needed for headache or moderate pain.   albuterol (VENTOLIN HFA) 108 (90 Base) MCG/ACT inhaler Inhale 2 puffs into the lungs every 6 (six) hours as needed for wheezing or shortness of breath.   amLODipine (NORVASC) 5 MG tablet Take 1 tablet (5 mg total) by mouth daily.   apixaban (ELIQUIS) 2.5 MG TABS tablet Take 1 tablet (2.5 mg total) by mouth 2 (two) times daily.   Azelastine HCl 137 MCG/SPRAY SOLN Place 1 spray into both nostrils 2 (two) times daily as needed (congestion).   Cholecalciferol (VITAMIN D3) 25 MCG (1000 UT) CAPS Take 1,000 Units by mouth once a week.   clobetasol ointment (TEMOVATE) 0.AB-123456789 Apply 1 Application topically daily as needed (irritation).   clonazePAM (KLONOPIN) 0.5 MG tablet Take 1 tablet by mouth twice daily as needed for anxiety   cloNIDine (CATAPRES) 0.1 MG tablet Take 1 tablet by mouth twice daily   famotidine (PEPCID) 20 MG tablet Take 20 mg by mouth at bedtime as needed for heartburn.   ferrous sulfate 325 (65 FE) MG tablet Take 1 tablet (325 mg total) by mouth daily with breakfast.   fluticasone (FLONASE) 50 MCG/ACT nasal spray Place 1 spray into both nostrils daily as needed for allergies or rhinitis.   levocetirizine (XYZAL ALLERGY 24HR) 5 MG tablet Take 1 tablet (5 mg total) by mouth every evening.   losartan (COZAAR) 100 MG tablet Take 1 tablet by mouth once daily   nitroGLYCERIN (NITROSTAT) 0.4 MG SL tablet DISSOLVE ONE TABLET UNDER THE TONGUE EVERY 5 MINUTES AS NEEDED FOR CHEST PAIN.  DO NOT EXCEED A TOTAL OF 3 DOSES IN 15 MINUTES   ondansetron (ZOFRAN-ODT) 4 MG disintegrating tablet Take 1 tablet (4 mg total) by mouth every 8 (eight) hours as needed for nausea or vomiting.   ranolazine (RANEXA) 500 MG 12 hr tablet Take 500 mg by mouth as needed.   spironolactone (ALDACTONE) 25 MG tablet Take 1 tablet by mouth once daily   torsemide (DEMADEX) 20 MG  tablet Take 4 tablets (80 mg total) by mouth every morning AND 3 tablets (60 mg total) every evening.    Allergies:   Contrast media [iodinated contrast media], Clonidine derivatives, Statins, Sulfa antibiotics, Celebrex [celecoxib], Isosorbide nitrate, Other, and Tape   Social History   Tobacco Use   Smoking status: Never    Passive exposure: Never   Smokeless tobacco: Never  Vaping Use   Vaping Use: Never used  Substance Use Topics   Alcohol use: No   Drug use: No    Family Hx: The patient's family history includes Cancer in her sister and sister; Heart attack in her mother; Heart disease in her mother; Heart disease (age of onset: 84) in her brother; Hypertension in her father. There is no history of Stroke.  ROS     Physical Exam:    VS:  BP 136/70   Pulse (!) 54   Ht '5\' 3"'$  (1.6 m)   Wt 175 lb 3.2 oz (79.5 kg)   SpO2 96%   BMI 31.04 kg/m     Wt Readings from Last 3 Encounters:  11/09/22 175 lb 3.2 oz (79.5 kg)  10/29/22 174 lb (78.9 kg)  10/29/22 174 lb (78.9 kg)    Physical Exam  GEN: Well nourished, well developed, in no acute distress  Neck: no JVD, carotid bruits, or masses Cardiac:RRR; 3/6 systolic murmur LSB Respiratory:  decreased breath sounds without wheezing or rales GI: soft, nontender, nondistended, + BS Ext: 2-3 plus LE edema some erythema, Good distal pulses bilaterally Neuro:  Alert and Oriented x 3,  Psych: euthymic mood, full affect        EKGs/Labs/Other Test Reviewed:    EKG:  EKG is  not ordered today.     Recent Labs: 12/01/2021: TSH 3.878 03/15/2022: Magnesium 2.1 10/29/2022: ALT 11; B Natriuretic Peptide 136.4; BUN 30; Creatinine, Ser 1.07; Hemoglobin 10.3; Platelets 226; Potassium 4.0; Sodium 132   Recent Lipid Panel Recent Labs    12/01/21 1116  CHOL 90  TRIG 49  HDL 28*  VLDL 10  LDLCALC 52     Prior CV Studies:     Echo (7/23): EF 60-65%, mild LVH, moderate RV enlargement, normal RV systolic function, PASP 50 mmHg,  mild mitral stenosis with mean gradient 4 mmHg, mild-moderate AS, IVC normal.  Risk Assessment/Calculations/Metrics:    CHA2DS2-VASc Score =     This indicates a  % annual risk of stroke. The patient's score is based upon:               ASSESSMENT & PLAN:   No problem-specific Assessment & Plan notes found for this encounter.   CAD: Nonobstructive on 4/22 cath. She has been intolerant of statins and Zetia. In ED 10/29/32 with chest pain, N/V-troponins negative. She has chest pain associated with wheezing and phlegm. Has increased edema today. Will give extra demadex and keep f/u with AHF and pulmonary   Chronic atrial fibrillation/bradycardia: She is off nodal blockade. - Continue Eliquis 5 mg bid.    - 7 day Zio (11/23) showed AF with average HR 54 bpm, with 2 3-sec pauses. Will follow.   HTN: BP controlled  Chronic diastolic CHF: NYHA class III symptoms, slowly progressive over time.  Echo in 7/23 showed EF 60-65%, mild LVH, moderate RV enlargement, normal RV systolic function, PASP 50 mmHg, mild mitral stenosis with mean gradient 4 mmHg, mild-moderate AS, IVC normal. NYHA III, she has volume overloaded today,  - increase torsemide 80 mg q am/80 mg q pm today and keep f/u with AHF tomorrow. - Continue losartan 100 mg daily.  - Continue spiro 25 mg daily. - Unable to tolerate Jardiance due to frequent UTIs.  - she has declined Cardiomems    Takotsubo cardiomyopathy recovered             Dispo:  No follow-ups on file.   Medication Adjustments/Labs and Tests Ordered: Current medicines are reviewed at length with  the patient today.  Concerns regarding medicines are outlined above.  Tests Ordered: No orders of the defined types were placed in this encounter.  Medication Changes: No orders of the defined types were placed in this encounter.  Sumner Boast, PA-C  11/09/2022 10:20 AM    [facility], [place], [place]  [address] Phone: [phone]; Fax: [phone]

## 2022-11-02 NOTE — Transitions of Care (Post Inpatient/ED Visit) (Signed)
   11/02/2022  Name: [name] MRN: [id number] DOB: 04-25-1935  Today's TOC FU Call Status: Today's TOC FU Call Status:: Successful TOC FU Call Competed TOC FU Call Complete Date: 11/02/22  Transition Care Management Follow-up Telephone Call Date of Discharge: 10/29/22 Discharge Facility: [facility]) Type of Discharge: Emergency Department Reason for ED Visit: Cardiac Conditions Cardiac Conditions Diagnosis: Chest Pain Persisting How have you been since you were released from the hospital?: Better Any questions or concerns?: No  Items Reviewed: Did you receive and understand the discharge instructions provided?: Yes Medications obtained and verified?: Yes (Medications Reviewed) Any new allergies since your discharge?: No Dietary orders reviewed?: NA Do you have support at home?: Yes People in Home: alone Name of Support/Comfort Primary Source: son, [place] and Equipment/Supplies: [place] Ordered?: No Any new equipment or medical supplies ordered?: No  Functional Questionnaire: Do you need assistance with bathing/showering or dressing?: No Do you need assistance with meal preparation?: No Do you need assistance with eating?: No Do you have difficulty maintaining continence: No Do you need assistance with getting out of bed/getting out of a chair/moving?: No Do you have difficulty managing or taking your medications?: No  Folllow up appointments reviewed: PCP Follow-up appointment confirmed?: Yes Date of PCP follow-up appointment?: 11/09/22 Follow-up Provider: [facility] Follow-up appointment confirmed?: Yes Date of Specialist follow-up appointment?: 11/09/23 Follow-Up Specialty Provider:: Ermalinda Barrios, PA-C (cardio) Do you need transportation to your follow-up appointment?: No Do you understand care options if your condition(s) worsen?: Yes-patient verbalized understanding  SDOH Interventions Today    Flowsheet Row  Most Recent Value  SDOH Interventions   Transportation Interventions Intervention Not Indicated  Financial Strain Interventions Intervention Not Indicated       Chong Sicilian, BSN, RN-BC RN Care Coordinator [place]: [phone] Main #: [phone]

## 2022-11-03 ENCOUNTER — Telehealth: Payer: Self-pay

## 2022-11-03 DIAGNOSIS — R194 Change in bowel habit: Secondary | ICD-10-CM | POA: Diagnosis not present

## 2022-11-03 DIAGNOSIS — R1012 Left upper quadrant pain: Secondary | ICD-10-CM | POA: Diagnosis not present

## 2022-11-03 NOTE — Telephone Encounter (Signed)
     Patient  visit on 2/23  at [facility]    Have you been able to follow up with your primary care physician? Yes   The patient was or was not able to obtain any needed medicine or equipment. Yes   Are there diet recommendations that you are having difficulty following? Na   Patient expresses understanding of discharge instructions and education provided has no other needs at this time.  Yes      [place] [phone] [address], [place], [place] [address] Phone: [phone] Email: [email or web address]

## 2022-11-04 ENCOUNTER — Telehealth: Payer: Self-pay | Admitting: *Deleted

## 2022-11-04 ENCOUNTER — Encounter: Payer: Self-pay | Admitting: *Deleted

## 2022-11-04 DIAGNOSIS — Z599 Problem related to housing and economic circumstances, unspecified: Secondary | ICD-10-CM

## 2022-11-04 NOTE — Patient Outreach (Signed)
  Care Coordination   Follow Up Visit Note   11/04/2022 Name: [name] MRN: [id number] DOB: [birth year]  [name] is a 88 y.o. year old female who sees [name], Cammie Mcgee, MD for primary care. I spoke with  [name] by phone today.  What matters to the patients health and wellness today?  Managing inability to do things that she used to be able to do    Goals Addressed             This Visit's Progress    Care Coordination Services       Care Coordination Goals: Patient will talk with Nat Christen, LCSW [phone]) for counseling assessment and services Patient will talk with [place] regarding utility costs and assistance with phone bill Patient will keep follow-up appt with PCP on 11/09/22 Patient will reach out to Care Coordination care team as needed        SDOH assessments and interventions completed:  Yes  SDOH Interventions Today    Flowsheet Row Most Recent Value  SDOH Interventions   Depression Interventions/Treatment  Counseling  [id number] neg but patient reports feeling frustrated due to inability to physically do things that she used to be able to do]  Financial Strain Interventions Other (Comment)  [id number] for utility assistance: phone bill]      Care Coordination Interventions:  Yes, provided   Follow up plan: Follow up call scheduled for 11/08/22 with Nat Christen, [place] [phone])   Encounter Outcome:  Pt. Visit Completed   Chong Sicilian, BSN, RN-BC RN Care Coordinator [place] Direct Dial: [phone] Main #: [phone]

## 2022-11-08 ENCOUNTER — Encounter: Payer: Self-pay | Admitting: *Deleted

## 2022-11-08 ENCOUNTER — Ambulatory Visit: Payer: Self-pay | Admitting: *Deleted

## 2022-11-08 NOTE — Patient Instructions (Signed)
Visit Information  Thank you for taking time to visit with me today. Please don't hesitate to contact me if I can be of assistance to you.   Following are the goals we discussed today:   Goals Addressed               This Visit's Progress     Provide Counseling & Supportive Services. (pt-stated)   On track     Care Coordination Interventions:  Interventions Today    Flowsheet Row Most Recent Value  Chronic Disease   Chronic disease during today's visit Atrial Fibrillation (AFib), Hypertension (HTN), Congestive Heart Failure (CHF), Chronic Kidney Disease/End Stage Renal Disease (ESRD), Chronic Obstructive Pulmonary Disease (COPD), Other  [Feelings of Sadness & Frustration Regarding Loss of Independence.]  General Interventions   General Interventions Discussed/Reviewed General Interventions Discussed, General Interventions Reviewed, Annual Eye Exam, Durable Medical Equipment (DME), Vaccines, Health Screening, Community Resources, Level of Care, Communication with, Doctor Visits  [Primary Care Provider]  Vaccines COVID-19, Flu, Pneumonia, RSV, Shingles, Tetanus/Pertussis/Diphtheria  [Encouraged]  Doctor Visits Discussed/Reviewed Doctor Visits Discussed, Doctor Visits Reviewed, Annual Wellness Visits, PCP, Specialist  [Encouraged]  Health Screening Bone Density, Colonoscopy, Mammogram  [Encouraged]  Durable Medical Equipment (DME) BP Cuff  PCP/Specialist Visits Compliance with follow-up visit  [Encouraged]  Communication with PCP/Specialists, RN  Level of Care Adult Daycare, Applications, Assisted Living, Personal Care Services  Applications Medicaid, Personal Care Services  Exercise Interventions   Exercise Discussed/Reviewed Exercise Discussed, Exercise Reviewed, Physical Activity, Weight Managment, Assistive device use and maintanence  [Encouraged]  Physical Activity Discussed/Reviewed Physical Activity Discussed, Physical Activity Reviewed, Types of exercise, Home Exercise Program  (HEP)  [Encouraged]  Weight Management Weight maintenance  [Encouraged]  Education Interventions   Education Provided Provided Engineer, site, Provided Education  Provided Verbal Education On Nutrition, Foot Care, Eye Care, Mental Health/Coping with Illness, Applications, Exercise, Medication, Insurance Plans, [employer], When to see the doctor  Applications Medicaid, [place] Discussed/Reviewed Mental Health Discussed, Mental Health Reviewed, Coping Strategies, Suicide, Crisis, Substance Abuse, Grief and Loss, Depression, Anxiety  Refer to Social Work for counseling regarding Anxiety/Coping, Depression, Grief and Loss  Nutrition Interventions   Nutrition Discussed/Reviewed Nutrition Discussed, Nutrition Reviewed, Portion sizes, Decreasing sugar intake, Decreasing salt, Decreasing fats, Increaing proteins, Fluid intake, Adding fruits and vegetables  [Encouraged]  Pharmacy Interventions   Pharmacy Dicussed/Reviewed Pharmacy Topics Discussed, Pharmacy Topics Reviewed, Medication Adherence, Affording Medications  Safety Interventions   Safety Discussed/Reviewed Safety Discussed, Safety Reviewed, Fall Risk, Home Safety  [Encouraged]  Home Safety Assistive Devices, Refer for community resources  Advanced Directive Interventions   Advanced Directives Discussed/Reviewed Advanced Directives Discussed, Advanced Directives Reviewed     Assessed Social Determinant of Health Barriers. Discussed Plans for Ongoing Care Management Follow Up. Provided Tree surgeon Information for Care Management Team Members. Screened for Signs & Symptoms of Depression, Related to Chronic Disease State.  PHQ2 & PHQ9 Depression Screen Completed & Results Reviewed.  Suicidal Ideation & Homicidal Ideation Assessed - None Present.   Domestic Violence Assessed - None Present. Access to Weapons Assessed - None Present.   Active Listening & Reflection  Utilized.  Verbalization of Feelings Encouraged.  Emotional Support Provided. Feelings of Grief Validated, with Regards to Loss of Husband 01/2022. Grief & Loss Support Groups Discussed & Resources Mailed on 11/08/2022. Feelings of Frustration Substantiated, with Regards to Loss of Independence. Caregiver Stress Acknowledged. Caregiver Resources Reviewed. Caregiver Support Groups Provided. Self-Enrollment in Caregiver Support  Group of Interest Emphasized. Crisis Support Information, Agencies, Services & Resources Discussed. Problem Solving Interventions Identified. Task-Centered Solutions Implemented.   Solution-Focused Strategies Developed. Acceptance & Commitment Therapy Introduced. Brief Cognitive Behavioral Therapy Initiated. Client-Centered Therapy Enacted. Reviewed Prescription Medications & Discussed Importance of Compliance. CSW Collaboration with Primary Care Provider, Dr. Jenna Luo with [place] [phone]), to Request Review of Current Psychotropic Medication Regimen & Consideration to Making Changes. Quality of Sleep Assessed & Sleep Hygiene Techniques Promoted. Discussed Higher Level of Care Options ([place], [place]) & Encouraged Consideration. Verified No In-Home Care Services, [employer], Building control surveyor, Etc., Covered Under Teaching laboratory technician through [employer].  Reviewed Designer, television/film set through [facility] & Traditional Medicare & Encouraged Completion of Medicaid Application.  Verified No Long-Term Care Insurance Benefits, [employer], Plans, Etc.  Verified Patient, Nor Deceased Husband, Were Veterans, Making Patient Ineligible to Apply for Aid & Attendance Benefits, Through [employer]. Verified Son, Braulio Conte Vanderlinden Lives Next  Door & Assists with Activities of Daily Living, Provides Transportation To & From All Physician Appointments, Administers Medications & Performs All Housekeeping Duties. Please Review the Following List of Express Scripts, [employer], [employer], Mailed on 11/08/2022 & Be Prepared to Discuss with CSW, During Next Scheduled Telephone Outreach Call: ~ Adult Day Care Programs  ~ [place] ~ [place] ~ [place] ~ [place] of [place] Please Keep Follow-Up Appointment with Certified Physician Assistant, Ermalinda Barrios with [place] at [facility] [phone]), Scheduled on 11/09/2022 at 10:15 AM. Please Keep Follow-Up Appointment with Primary Care Provider, Dr. Jenna Luo with [place] [phone]# [phone]), Scheduled on 11/09/2022 at 12:15 PM. Please Keep Follow-Up Appointment with Cardiologist, Dr. Loralie Champagne with [facility] [phone]), Scheduled on 11/10/2022 at 2:00 PM. Please Keep Follow-Up Appointment with Certified Physician Assistant, Lawerance Cruel with [facility] - Neurology [phone]), Scheduled on 11/11/2022 at 10:45 AM.      Our next appointment is by telephone on 11/15/2022 at 12:00 pm.  Please call the care guide team at [phone] if you need to cancel or reschedule your appointment.   If you are experiencing a Mental Health or [place] or need someone to talk to, please call the Suicide and Crisis Lifeline: 988 call the [place] National Suicide Prevention Lifeline: 606-239-7751 or TTY: 714-228-6710 TTY (305)107-9110) to talk to a trained counselor call 1-800-273-TALK (toll free, 24 hour hotline) go to [facility] [address] [place] [phone]) call the [place]:  [phone] call 911  Patient verbalizes understanding of instructions and care plan provided today and agrees to view in [place]. Active MyChart status and patient understanding of how to access instructions and care plan via MyChart confirmed with patient.     Telephone follow up appointment with care management team member scheduled for:  11/15/2022 at 12:00 pm.  Nat Christen, BSW, MSW, [place]  Licensed Clinical Social Worker  [place]  Mailing [place]. [address] [place], [place] [address] Physical Address[address], [place], [place] [address] Toll Free Main # [phone] Fax # [phone] Cell # [phone] [email or web address]

## 2022-11-08 NOTE — Patient Outreach (Signed)
Care Coordination   Initial Visit Note   11/08/2022  Name: [name] MRN: [id number] DOB: 05-28-1935  [name] is a 88 y.o. year old female who sees [name], Cammie Mcgee, MD for primary care. I spoke with [name] by phone today.  What matters to the patients health and wellness today?  Provide Counseling & Supportive Services.    Goals Addressed               This Visit's Progress     Provide Counseling & Supportive Services. (pt-stated)   On track     Care Coordination Interventions:  Interventions Today    Flowsheet Row Most Recent Value  Chronic Disease   Chronic disease during today's visit Atrial Fibrillation (AFib), Hypertension (HTN), Congestive Heart Failure (CHF), Chronic Kidney Disease/End Stage Renal Disease (ESRD), Chronic Obstructive Pulmonary Disease (COPD), Other  [Feelings of Sadness & Frustration Regarding Loss of Independence.]  General Interventions   General Interventions Discussed/Reviewed General Interventions Discussed, General Interventions Reviewed, Annual Eye Exam, Durable Medical Equipment (DME), Vaccines, Health Screening, Community Resources, Level of Care, Communication with, Doctor Visits  [Primary Care Provider]  Vaccines COVID-19, Flu, Pneumonia, RSV, Shingles, Tetanus/Pertussis/Diphtheria  [Encouraged]  Doctor Visits Discussed/Reviewed Doctor Visits Discussed, Doctor Visits Reviewed, Annual Wellness Visits, PCP, Specialist  [Encouraged]  Health Screening Bone Density, Colonoscopy, Mammogram  [Encouraged]  Durable Medical Equipment (DME) BP Cuff  PCP/Specialist Visits Compliance with follow-up visit  [Encouraged]  Communication with PCP/Specialists, RN  Level of Care Adult Daycare, Applications, Assisted Living, Personal Care Services  Applications Medicaid, Personal Care Services  Exercise Interventions   Exercise Discussed/Reviewed Exercise Discussed, Exercise Reviewed, Physical Activity, Weight Managment, Assistive device use and maintanence   [Encouraged]  Physical Activity Discussed/Reviewed Physical Activity Discussed, Physical Activity Reviewed, Types of exercise, Home Exercise Program (HEP)  [Encouraged]  Weight Management Weight maintenance  [Encouraged]  Education Interventions   Education Provided Provided Engineer, site, Provided Education  Provided Verbal Education On Nutrition, Foot Care, Eye Care, Mental Health/Coping with Illness, Applications, Exercise, Medication, Insurance Plans, [employer], When to see the doctor  Applications Medicaid, [place] Discussed, Mental Health Reviewed, Coping Strategies, Suicide, Crisis, Substance Abuse, Grief and Loss, Depression, Anxiety  Refer to Social Work for counseling regarding Anxiety/Coping, Depression, Grief and Loss  Nutrition Interventions   Nutrition Discussed/Reviewed Nutrition Discussed, Nutrition Reviewed, Portion sizes, Decreasing sugar intake, Decreasing salt, Decreasing fats, Increaing proteins, Fluid intake, Adding fruits and vegetables  [Encouraged]  Pharmacy Interventions   Pharmacy Dicussed/Reviewed Pharmacy Topics Discussed, Pharmacy Topics Reviewed, Medication Adherence, Affording Medications  Safety Interventions   Safety Discussed/Reviewed Safety Discussed, Safety Reviewed, Fall Risk, Home Safety  [Encouraged]  Home Safety Assistive Devices, Refer for community resources  Advanced Directive Interventions   Advanced Directives Discussed/Reviewed Advanced Directives Discussed, Advanced Directives Reviewed     Assessed Social Determinant of Health Barriers. Discussed Plans for Ongoing Care Management Follow Up. Provided Tree surgeon Information for Care Management Team Members. Screened for Signs & Symptoms of Depression, Related to Chronic Disease State.  PHQ2 & PHQ9 Depression Screen Completed & Results Reviewed.  Suicidal Ideation & Homicidal Ideation  Assessed - None Present.   Domestic Violence Assessed - None Present. Access to Weapons Assessed - None Present.   Active Listening & Reflection Utilized.  Verbalization of Feelings Encouraged.  Emotional Support Provided. Feelings of Grief Validated, with Regards to Loss of Husband 01/2022. Grief & Loss Support Groups Discussed &  Resources Mailed on 11/08/2022. Feelings of Frustration Substantiated, with Regards to Loss of Independence. Caregiver Stress Acknowledged. Caregiver Resources Reviewed. Caregiver Support Groups Provided. Self-Enrollment in Caregiver Support Group of Interest Emphasized. Crisis Support Information, Agencies, Services & Resources Discussed. Problem Solving Interventions Identified. Task-Centered Solutions Implemented.   Solution-Focused Strategies Developed. Acceptance & Commitment Therapy Introduced. Brief Cognitive Behavioral Therapy Initiated. Client-Centered Therapy Enacted. Reviewed Prescription Medications & Discussed Importance of Compliance. CSW Collaboration with Primary Care Provider, Dr. Jenna Luo with [place] [phone]), to Request Review of Current Psychotropic Medication Regimen & Consideration to Making Changes. Quality of Sleep Assessed & Sleep Hygiene Techniques Promoted. Discussed Higher Level of Care Options ([place], [place]) & Encouraged Consideration. Verified No In-Home Care Services, [employer], Building control surveyor, Etc., Covered Under Teaching laboratory technician through [employer].  Reviewed Designer, television/film set through [facility] & Traditional Medicare & Encouraged Completion of Medicaid Application.  Verified No Long-Term Care Insurance Benefits, [employer], Plans, Etc.  Verified Patient, Nor Deceased Husband, Were  Veterans, Making Patient Ineligible to Apply for Aid & Attendance Benefits, Through [employer]. Verified Son, Braulio Conte [place] Lives Next Door & Assists with Activities of Daily Living, Provides Transportation To & From All Physician Appointments, Administers Medications & Performs All Housekeeping Duties. Please Review the Following List of Express Scripts, [employer], [employer], Mailed on 11/08/2022 & Be Prepared to Discuss with CSW, During Next Scheduled Telephone Outreach Call: ~ Adult Day Care Programs  ~ [place] ~ [place] ~ [place] ~ [place] of [place] Please Keep Follow-Up Appointment with Certified Physician Assistant, Ermalinda Barrios with [place] at [facility] [phone]), Scheduled on 11/09/2022 at 10:15 AM. Please Keep Follow-Up Appointment with Primary Care Provider, Dr. Jenna Luo with [place] [phone]), Scheduled on 11/09/2022 at 12:15 PM. Please Keep Follow-Up Appointment with Cardiologist, Dr. Loralie Champagne with [facility] [phone]), Scheduled on 11/10/2022 at 2:00 PM. Please Keep Follow-Up Appointment with Certified Physician Assistant, Lawerance Cruel with [facility] - Neurology [phone]), Scheduled on 11/11/2022 at 10:45 AM.        SDOH assessments and interventions completed:  Yes.  SDOH Interventions Today    Flowsheet Row Most Recent Value  SDOH Interventions   Food Insecurity Interventions Intervention Not Indicated  Housing Interventions Intervention Not Indicated  Transportation Interventions Intervention Not Indicated, Patient Resources (Friends/Family), Payor Benefit  Utilities Interventions Intervention Not Indicated  Alcohol Usage Interventions Intervention Not Indicated (Score <7)  Depression  Interventions/Treatment  Referral to Psychiatry, Medication, Counseling  Financial Strain Interventions Other (Comment)  [Provide Financial Resources]  Physical Activity Interventions Intervention Not Indicated  Stress Interventions Intervention Not Indicated, Offered [employer], Provide Counseling, Other (Comment)  [Offered Counseling Resources]  Social Connections Interventions Intervention Not Indicated     Care Coordination Interventions:  Yes, provided.   Follow up plan: Follow up call scheduled for 11/15/2022 at 12:00 PM.  Encounter Outcome:  Pt. Visit Completed.   Nat Christen, BSW, MSW, LCSW  Licensed Education officer, [facility]  Mailing [place] [address] [place], [place] [address] Physical Address-[address] [place], [place] [address] Toll Free Main # [phone] Fax # [phone]  Cell # [phone] [email or web address]

## 2022-11-09 ENCOUNTER — Encounter: Payer: Self-pay | Admitting: Physician Assistant

## 2022-11-09 ENCOUNTER — Ambulatory Visit: Payer: Medicare Other | Attending: Cardiology | Admitting: Physician Assistant

## 2022-11-09 ENCOUNTER — Encounter: Payer: Self-pay | Admitting: Family Medicine

## 2022-11-09 ENCOUNTER — Telehealth: Payer: Self-pay | Admitting: *Deleted

## 2022-11-09 ENCOUNTER — Ambulatory Visit (INDEPENDENT_AMBULATORY_CARE_PROVIDER_SITE_OTHER): Payer: Medicare Other | Admitting: Family Medicine

## 2022-11-09 ENCOUNTER — Encounter: Payer: Self-pay | Admitting: *Deleted

## 2022-11-09 VITALS — BP 126/60 | HR 58 | Temp 97.7°F | Ht 63.0 in | Wt 175.0 lb

## 2022-11-09 VITALS — BP 136/70 | HR 54 | Ht 63.0 in | Wt 175.2 lb

## 2022-11-09 DIAGNOSIS — K219 Gastro-esophageal reflux disease without esophagitis: Secondary | ICD-10-CM | POA: Diagnosis not present

## 2022-11-09 DIAGNOSIS — I2510 Atherosclerotic heart disease of native coronary artery without angina pectoris: Secondary | ICD-10-CM

## 2022-11-09 DIAGNOSIS — R0789 Other chest pain: Secondary | ICD-10-CM | POA: Diagnosis not present

## 2022-11-09 DIAGNOSIS — I482 Chronic atrial fibrillation, unspecified: Secondary | ICD-10-CM | POA: Diagnosis not present

## 2022-11-09 DIAGNOSIS — Z8673 Personal history of transient ischemic attack (TIA), and cerebral infarction without residual deficits: Secondary | ICD-10-CM | POA: Diagnosis not present

## 2022-11-09 DIAGNOSIS — I509 Heart failure, unspecified: Secondary | ICD-10-CM | POA: Diagnosis not present

## 2022-11-09 DIAGNOSIS — I110 Hypertensive heart disease with heart failure: Secondary | ICD-10-CM | POA: Diagnosis not present

## 2022-11-09 DIAGNOSIS — M545 Low back pain, unspecified: Secondary | ICD-10-CM | POA: Diagnosis not present

## 2022-11-09 DIAGNOSIS — I5181 Takotsubo syndrome: Secondary | ICD-10-CM

## 2022-11-09 DIAGNOSIS — C50919 Malignant neoplasm of unspecified site of unspecified female breast: Secondary | ICD-10-CM | POA: Diagnosis not present

## 2022-11-09 DIAGNOSIS — I10 Essential (primary) hypertension: Secondary | ICD-10-CM | POA: Diagnosis not present

## 2022-11-09 DIAGNOSIS — I5032 Chronic diastolic (congestive) heart failure: Secondary | ICD-10-CM

## 2022-11-09 DIAGNOSIS — Z6831 Body mass index (BMI) 31.0-31.9, adult: Secondary | ICD-10-CM | POA: Diagnosis not present

## 2022-11-09 DIAGNOSIS — E785 Hyperlipidemia, unspecified: Secondary | ICD-10-CM | POA: Diagnosis not present

## 2022-11-09 DIAGNOSIS — Z9181 History of falling: Secondary | ICD-10-CM | POA: Diagnosis not present

## 2022-11-09 DIAGNOSIS — Z7901 Long term (current) use of anticoagulants: Secondary | ICD-10-CM | POA: Diagnosis not present

## 2022-11-09 DIAGNOSIS — E669 Obesity, unspecified: Secondary | ICD-10-CM | POA: Diagnosis not present

## 2022-11-09 DIAGNOSIS — I4891 Unspecified atrial fibrillation: Secondary | ICD-10-CM | POA: Diagnosis not present

## 2022-11-09 DIAGNOSIS — G8929 Other chronic pain: Secondary | ICD-10-CM | POA: Diagnosis not present

## 2022-11-09 NOTE — Progress Notes (Signed)
Subjective:    Patient ID: [name], female    DOB: 04-Apr-1935, 88 y.o.   MRN: [id number] Patient is an 88 year old female with a history of mild to moderate aortic stenosis, congestive heart failure status post Takotsubo syndrome in 2016 and chronic diastolic heart failure at the present time, atrial fibrillation, and pulmonary hypertension who presents with chest tightness.  She was seen in the emergency room recently for this tightness.  She reports in the center chest.  She has chronic shortness of breath.  She states that on the 28th, she developed nausea and vomiting that morning.  She could not keep her medicines down.  She then felt tightness and discomfort in the center of her chest below her sternum.  She went to the emergency room.  EKG was unchanged.  Chest x-ray was unremarkable.  Troponins were negative.  ED provider noted wheezing on exam but otherwise her exam was unremarkable.  Patient states she was not given any medication.  She is here today for follow-up.  She continues to report tightness and pressure in the center of the chest.  She denies any nausea or vomiting.  She denies any diaphoresis.  She denies any pain radiating down her arm.  She denies any heartburn.  She is on Pepcid.  Albuterol that was given to her in the emergency room has not helped with the tightness.  Her lungs are completely clear on auscultation bilaterally today. Past Medical History:  Diagnosis Date   Allergy    rhinitis   Aortic stenosis    mild by echo 06/2017   Arthritis    Bradycardia    a. 10/2017 -> beta blocker cut back due to HR 39.   Breast cancer ([place]) 01/06/2012   Cancer ([place])    right colon and left breast   Chronic diastolic CHF (congestive heart failure) ([place])    Colon cancer ([place]) 01/06/2012   Colovesical fistula    Dr. Marlou Starks and [name] planning surgery 08/2018- surgery revealed spontaneous closure   COPD (chronic obstructive pulmonary disease) ([place])    pt. denies   Coronary artery  disease 2006   a.  NSTEMI in 2016, cath showed 15% prox-mid RCA, 20% prox LAD, EF 25-35% by cath and 35-40% -> felt due to Takotsubo cardiomyopathy.   Dilated aortic root ([place])    60mHg by echo 06/2017   Diverticulosis    Dyspnea    Edema extremities    GERD (gastroesophageal reflux disease)    Hernia    Hiatal hernia    denies   Hyperlipidemia    Hypertension    Mild aortic stenosis    echo 11/2015 but not noted on echo 06/2016   Osteopenia    Permanent atrial fibrillation (HCC)    chronic atrial fibrillation   Pneumonia    hx child   Pulmonary HTN ([facility]    a. moderate to severe PASP 670mg echo 11/2015 - now 4169m by echo 06/2017. CTA chest in 11/16 with no PE. PFTs in 7/15 with mild obstructive lung disease. She had a negative sleep study in 2017. b. Felt due to left sided HF.   [facility]  Takotsubo syndrome 07/29/2015   a. EF 35-40% by echo; akinesis of mid-apical anteroseptal and apical myocardium.  EF now normalized on echo 11/2015   Past Surgical History:  Procedure Laterality Date   APPENDECTOMY     BREAST SURGERY     lumpectomy left   CARDIAC CATHETERIZATION  CARDIAC CATHETERIZATION N/A 07/28/2015   Procedure: Left Heart Cath and Coronary Angiography;  Surgeon: Peter M Martinique, MD;  Location: [facility];  Service: Cardiovascular;  Laterality: N/A;   CHOLECYSTECTOMY     COLECTOMY     right side   CYSTOSCOPY WITH STENT PLACEMENT Left 09/04/2018   Procedure: CYSTOSCOPY WITH LEFT STENT PLACEMENT, BLADDER REPAIR, CYSTOSCOPY WITH LEFT STENT REMOVAL;  Surgeon: Raynelle Bring, MD;  Location: [facility];  Service: Urology;  Laterality: Left;   EXCISION OF ACCESSORY NIPPLE Bilateral 05/30/2013   Procedure: BILATERAL NIPPLE BIOPSY;  Surgeon: Merrie Roof, MD;  Location: [facility];  Service: General;  Laterality: Bilateral;   EYE SURGERY Bilateral 12   cataracts   HYSTEROSCOPY WITH D & C N/A 05/29/2020   Procedure: DILATATION AND CURETTAGE /HYSTEROSCOPY;  Surgeon:  Homero Fellers, MD;  Location: [facility];  Service: Gynecology;  Laterality: N/A;   IR RADIOLOGIST EVAL & MGMT  07/26/2017   LAPAROSCOPIC RIGHT COLECTOMY N/A 09/04/2018   Procedure: LAPAROSCOPIC ASSISTED SIGMOID COLECTOMY WITH REPAIR OF FISTULA TO BLADDER;  Surgeon: Jovita Kussmaul, MD;  Location: [facility];  Service: General;  Laterality: N/A;   RIGHT/LEFT HEART CATH AND CORONARY ANGIOGRAPHY N/A 12/10/2020   Procedure: RIGHT/LEFT HEART CATH AND CORONARY ANGIOGRAPHY;  Surgeon: Larey Dresser, MD;  Location: [facility];  Service: Cardiovascular;  Laterality: N/A;   SPLIT NIGHT STUDY  02/02/2016       Current Outpatient Medications on File Prior to Visit  Medication Sig Dispense Refill   acetaminophen (TYLENOL) 500 MG tablet Take 1,000 mg by mouth every 6 (six) hours as needed for headache or moderate pain.     albuterol (VENTOLIN HFA) 108 (90 Base) MCG/ACT inhaler Inhale 2 puffs into the lungs every 6 (six) hours as needed for wheezing or shortness of breath. 8 g 0   amLODipine (NORVASC) 5 MG tablet Take 1 tablet (5 mg total) by mouth daily. 90 tablet 3   apixaban (ELIQUIS) 2.5 MG TABS tablet Take by mouth 2 (two) times daily.     Azelastine HCl 137 MCG/SPRAY SOLN Place 1 spray into both nostrils 2 (two) times daily as needed (congestion).     Cholecalciferol (VITAMIN D3) 25 MCG (1000 UT) CAPS Take 1,000 Units by mouth once a week.     clobetasol ointment (TEMOVATE) [id number] % Apply 1 Application topically daily as needed (irritation).     clonazePAM (KLONOPIN) 0.5 MG tablet Take 1 tablet by mouth twice daily as needed for anxiety 60 tablet 0   cloNIDine (CATAPRES) 0.1 MG tablet Take 1 tablet by mouth twice daily 180 tablet 0   famotidine (PEPCID) 20 MG tablet Take 20 mg by mouth at bedtime as needed for heartburn.     ferrous sulfate 325 (65 FE) MG tablet Take 1 tablet (325 mg total) by mouth daily with breakfast. 90 tablet 3   fluticasone (FLONASE) 50 MCG/ACT nasal spray Place 1 spray  into both nostrils daily as needed for allergies or rhinitis.     levocetirizine (XYZAL ALLERGY 24HR) 5 MG tablet Take 1 tablet (5 mg total) by mouth every evening. 30 tablet 0   losartan (COZAAR) 100 MG tablet Take 1 tablet by mouth once daily 90 tablet 0   nitroGLYCERIN (NITROSTAT) 0.4 MG SL tablet DISSOLVE ONE TABLET UNDER THE TONGUE EVERY 5 MINUTES AS NEEDED FOR CHEST PAIN.  DO NOT EXCEED A TOTAL OF 3 DOSES IN 15 MINUTES 25 tablet 8   ondansetron (ZOFRAN-ODT)  4 MG disintegrating tablet Take 1 tablet (4 mg total) by mouth every 8 (eight) hours as needed for nausea or vomiting. 20 tablet 0   ranolazine (RANEXA) 500 MG 12 hr tablet Take 500 mg by mouth as needed.     spironolactone (ALDACTONE) 25 MG tablet Take 1 tablet by mouth once daily 90 tablet 2   torsemide (DEMADEX) 20 MG tablet Take 4 tablets (80 mg total) by mouth every morning AND 3 tablets (60 mg total) every evening. 210 tablet 5   No current facility-administered medications on file prior to visit.   Allergies  Allergen Reactions   Contrast Media [Iodinated Contrast Media] Hives and Other (See Comments)    Per pt strong burning sensation starting in chest radiating outward    Clonidine Derivatives Other (See Comments)    Throat dry   Statins Other (See Comments)    "bones hurt"   Sulfa Antibiotics Diarrhea    Tremors    Celebrex [Celecoxib] Rash   Isosorbide Nitrate Itching and Rash   Other Itching and Rash    Plastic and paper tape and heart monitor pads   Tape Itching and Rash    Red Where applied and will spread   Social History   Socioeconomic History   Marital status: Widowed    Spouse name: Not on file   Number of children: 1   Years of education: 60   Highest education level: 12th grade  Occupational History   Not on file  Tobacco Use   Smoking status: Never    Passive exposure: Never   Smokeless tobacco: Never  Vaping Use   Vaping Use: Never used  Substance and Sexual Activity   Alcohol use: No    Drug use: No   Sexual activity: Not Currently    Partners: Male  Other Topics Concern   Not on file  Social History Narrative   Husband Braulio Conte, passed away in 2022/01/28. Pt's son, Sherlon Handing lives beside of patient and helps with her care.    Social Determinants of Health   Financial Resource Strain: Low Risk  (11/08/2022)   Overall Financial Resource Strain (CARDIA)    Difficulty of Paying Living Expenses: Not very hard  Recent Concern: Financial Resource Strain - Medium Risk (11/04/2022)   Overall Financial Resource Strain (CARDIA)    Difficulty of Paying Living Expenses: Somewhat hard  Food Insecurity: No Food Insecurity (11/08/2022)   Hunger Vital Sign    Worried About Running Out of Food in the Last Year: Never true    Ran Out of Food in the Last Year: Never true  Transportation Needs: No Transportation Needs (11/08/2022)   PRAPARE - Hydrologist (Medical): No    Lack of Transportation (Non-Medical): No  Physical Activity: Sufficiently Active (11/08/2022)   Exercise Vital Sign    Days of Exercise per Week: 5 days    Minutes of Exercise per Session: 30 min  Recent Concern: Physical Activity - Insufficiently Active (08/11/2022)   Exercise Vital Sign    Days of Exercise per Week: 5 days    Minutes of Exercise per Session: 10 min  Stress: No Stress Concern Present (11/08/2022)   [place]    Feeling of Stress : Only a little  Social Connections: Moderately Integrated (11/08/2022)   Social Connection and Isolation Panel [NHANES]    Frequency of Communication with Friends and Family: More than three times a week  Frequency of Social Gatherings with Friends and Family: More than three times a week    Attends Religious Services: More than 4 times per year    Active Member of Clubs or Organizations: Yes    Attends Archivist Meetings: More than 4 times per year    Marital Status:  Widowed  Recent Concern: Social Connections - Moderately Isolated (08/11/2022)   Social Connection and Isolation Panel [NHANES]    Frequency of Communication with Friends and Family: More than three times a week    Frequency of Social Gatherings with Friends and Family: Twice a week    Attends Religious Services: More than 4 times per year    Active Member of [employer] or Organizations: No    Attends Archivist Meetings: Never    Marital Status: Widowed  Intimate Partner Violence: Not At Risk (11/08/2022)   Humiliation, Afraid, Rape, and Kick questionnaire    Fear of Current or Ex-Partner: No    Emotionally Abused: No    Physically Abused: No    Sexually Abused: No      Review of Systems  Musculoskeletal:  Positive for back pain.  All other systems reviewed and are negative.      Objective:   Physical Exam Vitals reviewed.  Constitutional:      General: She is not in acute distress.    Appearance: Normal appearance. She is well-developed and normal weight. She is not diaphoretic.  HENT:     Nose: No congestion or rhinorrhea.     Mouth/Throat:     Mouth: Mucous membranes are moist.     Pharynx: No oropharyngeal exudate or posterior oropharyngeal erythema.  Eyes:     General:        Right eye: No discharge.        Left eye: No discharge.     Conjunctiva/sclera: Conjunctivae normal.  Neck:     Vascular: No carotid bruit.  Cardiovascular:     Rate and Rhythm: Normal rate and regular rhythm.     Heart sounds: Murmur heard.     No friction rub. No gallop.  Pulmonary:     Effort: Pulmonary effort is normal. No accessory muscle usage, prolonged expiration or respiratory distress.     Breath sounds: Normal breath sounds and air entry. No stridor, decreased air movement or transmitted upper airway sounds. No decreased breath sounds, wheezing, rhonchi or rales.  Chest:     Chest wall: No mass, lacerations, deformity, swelling or crepitus.  Abdominal:     General: Abdomen  is flat. Bowel sounds are decreased. There is no distension.     Palpations: Abdomen is not rigid.     Tenderness: There is no abdominal tenderness. There is no guarding or rebound.     Hernia: No hernia is present.  Musculoskeletal:     Cervical back: No rigidity or tenderness.     Right lower leg: Edema present.     Left lower leg: Edema present.  Skin:    Findings: No bruising or lesion.  Neurological:     Mental Status: She is alert. Mental status is at baseline.     Motor: No abnormal muscle tone.     Coordination: Coordination normal.     Gait: Gait normal.     Deep Tendon Reflexes: Reflexes normal.  Psychiatric:        Mood and Affect: Mood normal.        Behavior: Behavior normal.  Thought Content: Thought content normal.        Judgment: Judgment normal.          Assessment & Plan:  Feeling of chest tightness Pulmonary exam is unremarkable.  I do not feel that the chest tightness is pulmonary in nature.  Differential diagnosis would be GI related versus cardiac.  Given her pulmonary hypertension, diastolic heart failure, peripheral edema, I feel that the patient is experiencing shortness of breath that she identifies as tightness in the chest.  Patient has stopped taking her Ranexa.  Therefore I asked her to start back on the Ranexa 500 mg once daily and recheck in a week.  If not improving, consider adding a proton pump inhibitor for possible atypical acid reflux.

## 2022-11-09 NOTE — Telephone Encounter (Signed)
   Telephone encounter was:  Successful.  11/09/2022 Name: [name] MRN: [id number] DOB: 12-23-1934  [name] is a 88 y.o. year old female who is a primary care patient of [name], Cammie Mcgee, MD . The community resource team was consulted for assistance with  phone Patient called about increased phone bill after providing me that she pays 62.00 told her she should call lifeline and then perhaps try boost mobile or mint mobile significantly cheaper  Care guide performed the following interventions: Patient provided with information about care guide support team and interviewed to confirm resource needs.  Follow Up Plan:  No further follow up planned at this time. The patient has been provided with needed resources. [place] [phone] [address] , [place] [address] [address] Email : [email or web address]

## 2022-11-09 NOTE — Patient Instructions (Signed)
Medication Instructions:  Take a extra tablet of Torsemide today   *If you need a refill on your cardiac medications before your next appointment, please call your pharmacy*   Lab Work: None ordered   If you have labs (blood work) drawn today and your tests are completely normal, you will receive your results only by: [place] (if you have MyChart) OR A paper copy in the mail If you have any lab test that is abnormal or we need to change your treatment, we will call you to review the results.   Testing/Procedures: None ordered    Follow-Up: Follow up with Heart Failure as scheduled    Other Instructions

## 2022-11-09 NOTE — Telephone Encounter (Signed)
   Telephone encounter was:  Unsuccessful.  11/09/2022 Name: [name] MRN: [id number] DOB: 02/25/1935  Unsuccessful outbound call made today to assist with:   Phone   Outreach Attempt:  1st Attempt  A HIPAA compliant voice message was left requesting a return call.  Instructed patient to call back at [phone]. [place] [phone] [address] , [place] [address] Email : [email or web address]

## 2022-11-10 ENCOUNTER — Ambulatory Visit (HOSPITAL_COMMUNITY)
Admission: RE | Admit: 2022-11-10 | Discharge: 2022-11-10 | Disposition: A | Discharge status: Home / Self Care | Payer: Medicare Other | Source: Ambulatory Visit | Attending: Cardiology | Admitting: Cardiology

## 2022-11-10 ENCOUNTER — Encounter (HOSPITAL_COMMUNITY): Payer: Self-pay | Admitting: Cardiology

## 2022-11-10 ENCOUNTER — Institutional Professional Consult (permissible substitution): Payer: Medicare Other | Admitting: Student in an Organized Health Care Education/Training Program

## 2022-11-10 VITALS — BP 110/50 | HR 55 | Wt 178.0 lb

## 2022-11-10 DIAGNOSIS — I272 Pulmonary hypertension, unspecified: Secondary | ICD-10-CM | POA: Insufficient documentation

## 2022-11-10 DIAGNOSIS — I350 Nonrheumatic aortic (valve) stenosis: Secondary | ICD-10-CM | POA: Insufficient documentation

## 2022-11-10 DIAGNOSIS — Z8673 Personal history of transient ischemic attack (TIA), and cerebral infarction without residual deficits: Secondary | ICD-10-CM | POA: Diagnosis not present

## 2022-11-10 DIAGNOSIS — Z6831 Body mass index (BMI) 31.0-31.9, adult: Secondary | ICD-10-CM | POA: Diagnosis not present

## 2022-11-10 DIAGNOSIS — Z87440 Personal history of urinary (tract) infections: Secondary | ICD-10-CM | POA: Insufficient documentation

## 2022-11-10 DIAGNOSIS — I110 Hypertensive heart disease with heart failure: Secondary | ICD-10-CM | POA: Insufficient documentation

## 2022-11-10 DIAGNOSIS — Z853 Personal history of malignant neoplasm of breast: Secondary | ICD-10-CM | POA: Diagnosis not present

## 2022-11-10 DIAGNOSIS — Z9181 History of falling: Secondary | ICD-10-CM | POA: Diagnosis not present

## 2022-11-10 DIAGNOSIS — Z79899 Other long term (current) drug therapy: Secondary | ICD-10-CM | POA: Insufficient documentation

## 2022-11-10 DIAGNOSIS — I2510 Atherosclerotic heart disease of native coronary artery without angina pectoris: Secondary | ICD-10-CM | POA: Insufficient documentation

## 2022-11-10 DIAGNOSIS — I482 Chronic atrial fibrillation, unspecified: Secondary | ICD-10-CM | POA: Insufficient documentation

## 2022-11-10 DIAGNOSIS — Z7901 Long term (current) use of anticoagulants: Secondary | ICD-10-CM | POA: Insufficient documentation

## 2022-11-10 DIAGNOSIS — C50919 Malignant neoplasm of unspecified site of unspecified female breast: Secondary | ICD-10-CM | POA: Diagnosis not present

## 2022-11-10 DIAGNOSIS — E669 Obesity, unspecified: Secondary | ICD-10-CM | POA: Diagnosis not present

## 2022-11-10 DIAGNOSIS — K219 Gastro-esophageal reflux disease without esophagitis: Secondary | ICD-10-CM | POA: Insufficient documentation

## 2022-11-10 DIAGNOSIS — I509 Heart failure, unspecified: Secondary | ICD-10-CM | POA: Diagnosis not present

## 2022-11-10 DIAGNOSIS — M545 Low back pain, unspecified: Secondary | ICD-10-CM | POA: Diagnosis not present

## 2022-11-10 DIAGNOSIS — E785 Hyperlipidemia, unspecified: Secondary | ICD-10-CM | POA: Insufficient documentation

## 2022-11-10 DIAGNOSIS — I4891 Unspecified atrial fibrillation: Secondary | ICD-10-CM | POA: Diagnosis not present

## 2022-11-10 DIAGNOSIS — I5032 Chronic diastolic (congestive) heart failure: Secondary | ICD-10-CM | POA: Diagnosis not present

## 2022-11-10 DIAGNOSIS — G8929 Other chronic pain: Secondary | ICD-10-CM | POA: Diagnosis not present

## 2022-11-10 LAB — BASIC METABOLIC PANEL
Anion gap: 10 (ref 5–15)
BUN: 42 mg/dL — ABNORMAL HIGH (ref 8–23)
CO2: 30 mmol/L (ref 22–32)
Calcium: 8.7 mg/dL — ABNORMAL LOW (ref 8.9–10.3)
Chloride: 91 mmol/L — ABNORMAL LOW (ref 98–111)
Creatinine, Ser: 1.44 mg/dL — ABNORMAL HIGH (ref 0.44–1.00)
GFR, Estimated: 36 mL/min — ABNORMAL LOW (ref >60)
Glucose, Bld: 117 mg/dL — ABNORMAL HIGH (ref 70–99)
Potassium: 4.3 mmol/L (ref 3.5–5.1)
Sodium: 131 mmol/L — ABNORMAL LOW (ref 135–145)

## 2022-11-10 LAB — BRAIN NATRIURETIC PEPTIDE: B Natriuretic Peptide: 119.2 pg/mL — ABNORMAL HIGH (ref 0.0–100.0)

## 2022-11-10 MED ORDER — METOLAZONE 2.5 MG PO TABS: 2.5000 mg | ORAL_TABLET | ORAL | 3 refills | Status: DC

## 2022-11-10 MED ORDER — APIXABAN 5 MG PO TABS: 5.0000 mg | ORAL_TABLET | Freq: Two times a day (BID) | ORAL | 11 refills | Status: DC

## 2022-11-10 MED ORDER — POTASSIUM CHLORIDE ER 10 MEQ PO TBCR: 20.0000 meq | EXTENDED_RELEASE_TABLET | ORAL | 3 refills | Status: DC

## 2022-11-10 NOTE — Patient Instructions (Signed)
KEEP Torsemide at 80 mg Twice daily  START Metolazone 2.5 mg every Thursday with your  Torsemide  START Potassium 20 mEq ( 2 Tabs) every Thursday with your metolazone.  INCREASE Eliquis to 5 mg Twice daily  Labs done today, your results will be available in MyChart, we will contact you for abnormal readings.  Repeat blood work in 10 days.  Your physician recommends that you schedule a follow-up appointment as scheduled  If you have any questions or concerns before your next appointment please send [place] a message through [place] or call our office at [phone].    TO LEAVE A MESSAGE FOR THE NURSE SELECT OPTION 2, PLEASE LEAVE A MESSAGE INCLUDING: YOUR NAME DATE OF BIRTH CALL BACK NUMBER REASON FOR CALL**this is important as we prioritize the call backs  YOU WILL RECEIVE A CALL BACK THE SAME DAY AS LONG AS YOU CALL BEFORE 4:00 PM  At the [facility], you and your health needs are our priority. As part of our continuing mission to provide you with exceptional heart care, we have created designated Provider Care Teams. These Care Teams include your primary Cardiologist (physician) and Advanced Practice Providers (APPs- Physician Assistants and Nurse Practitioners) who all work together to provide you with the care you need, when you need it.   You [place] see any of the following providers on your designated Care Team at your next follow up: Dr Glori Bickers Dr Loralie Champagne Dr. Roxana Hires, NP [name], [facility] Pence, [place] Na, NP Audry Riles, PharmD   Please be sure to bring in all your medications bottles to every appointment.    Thank you for choosing [facility]

## 2022-11-10 NOTE — Progress Notes (Signed)
ReDS Vest / Clip - 11/10/22 1400       ReDS Vest / Clip   Station Marker A    Ruler Value 30    ReDS Value Range Low volume    ReDS Actual Value 34

## 2022-11-11 DIAGNOSIS — Z9181 History of falling: Secondary | ICD-10-CM | POA: Diagnosis not present

## 2022-11-11 DIAGNOSIS — I4891 Unspecified atrial fibrillation: Secondary | ICD-10-CM | POA: Diagnosis not present

## 2022-11-11 DIAGNOSIS — C50919 Malignant neoplasm of unspecified site of unspecified female breast: Secondary | ICD-10-CM | POA: Diagnosis not present

## 2022-11-11 DIAGNOSIS — Z6831 Body mass index (BMI) 31.0-31.9, adult: Secondary | ICD-10-CM | POA: Diagnosis not present

## 2022-11-11 DIAGNOSIS — G8929 Other chronic pain: Secondary | ICD-10-CM | POA: Diagnosis not present

## 2022-11-11 DIAGNOSIS — M545 Low back pain, unspecified: Secondary | ICD-10-CM | POA: Diagnosis not present

## 2022-11-11 DIAGNOSIS — E669 Obesity, unspecified: Secondary | ICD-10-CM | POA: Diagnosis not present

## 2022-11-11 DIAGNOSIS — Z8673 Personal history of transient ischemic attack (TIA), and cerebral infarction without residual deficits: Secondary | ICD-10-CM | POA: Diagnosis not present

## 2022-11-11 DIAGNOSIS — Z7901 Long term (current) use of anticoagulants: Secondary | ICD-10-CM | POA: Diagnosis not present

## 2022-11-11 DIAGNOSIS — K219 Gastro-esophageal reflux disease without esophagitis: Secondary | ICD-10-CM | POA: Diagnosis not present

## 2022-11-11 DIAGNOSIS — E785 Hyperlipidemia, unspecified: Secondary | ICD-10-CM | POA: Diagnosis not present

## 2022-11-11 DIAGNOSIS — I509 Heart failure, unspecified: Secondary | ICD-10-CM | POA: Diagnosis not present

## 2022-11-11 DIAGNOSIS — I110 Hypertensive heart disease with heart failure: Secondary | ICD-10-CM | POA: Diagnosis not present

## 2022-11-11 NOTE — Progress Notes (Signed)
Patient ID: [name], female   DOB: 13-Aug-1935, 88 y.o.   MRN: [id number]    Advanced Heart Failure Clinic Note   PCP: [name], MD Cardiology: Dr. [name] HF Cardiology: Dr. Aundra Dubin  88 y.o. with history of poorly controlled HTN, chronic atrial fibrillation, chronic diastolic CHF, and prior episode of Takotsubo cardiomyopathy. Patient has had exertional dyspnea since 11/16.  At that time, she presented to the hospital with chest pain.  Coronary angiography showed no significant CAD.  EF was 35-40% by echo, suspected Takotsubo cardiomyopathy.  Her echo in 3/17 showed EF improved to [id number] but PA systolic pressure elevated at 65 mmHg.    RHC/LHC in 4/22 showed nonobstructive CAD; mean RA 5, PA 44/10, mean PCWP 20, CI 4.89, PVR < 1 WU.  Echo in 8/22 showed EF up to 65-70%, mild LVH, normal RV, PASP 67, moderate RAE, trivial MR. PYP scan in 9/22 was probably negative.   Patient was admitted in 7/23 with chest pain, she had a mild elevation in troponin with no trend.  This was thought to be due to demand ischemia with volume overload rather than ACS.  Echo this admission showed EF 60-65%, mild LVH, moderate RV enlargement, normal RV systolic function, PASP 50 mmHg, mild mitral stenosis with mean gradient 4 mmHg, mild-moderate AS, IVC normal.   7 day Zio 11/23 showed atrial fibrillation, average HR 54 bpm with 2 3-second pauses.  Patient returns for followup of chronic AF and CHF.  She remains quite symptomatic, short of breath walking around the house.  No orthopnea/PND.  Weight stable. Occasional lightheadedness with standing, no falls.  She has been taking torsemide 80 mg bid for about 2 days, prior was taking torsemide 80 qam/60 qpm.  No chest pain.    ECG (personally reviewed): Atrial fibrillation, LVH, poor RWP  Labs (11/23): K 4.1, creatinine 1.85, hgb 10.7 Labs (2/24): K 4.6, creatinine 1.59 => 1.07, BNP 136  PMH: 1. Hyperlipidemia: Myalgias with atorvastatin, Crestor, simvastatin.  Myalgias with Zetia.  2. Chronic atrial fibrillation 3. HTN: Poor control.  4. H/o breast cancer: 2013.  5. GERD 6. Takotsubo cardiomyopathy: Admitted in 11/16 with chest pain.  Coronary angiography showed nonobstructive CAD.  Echo (11/16) showed EF 35-40%.  EF back to normal by 3/17 echo.  7. Chronic diastolic CHF - Echo ([id number]) with EF 60-65%, mild aortic stenosis, PA systolic pressure 65 mmHg, RV mildly dilated with normal systolic function.  - Cardiolite (4/17) with EF 61%, small reversible mid anteroseptal/apical lateral defects thought to be related to shifting breast artifact => low risk study.  - Echo (12/21): EF 65-70%, normal RV, PASP 60 mmHg, mild AS, mild-moderate TR.   - RHC/LHC in 4/22 showed nonobstructive CAD; mean RA 5, PA 44/10, mean PCWP 20, CI 4.89, PVR < 1 WU.   - Echo in 8/22 showed EF up to 65-70%, mild LVH, normal RV, PASP 67, moderate RAE, trivial MR.  - PYP scan (9/22): H/CL < 1.5, grade 1.  Probably negative.   - Echo (7/23): EF 60-65%, mild LVH, moderate RV enlargement, normal RV systolic function, PASP 50 mmHg, mild mitral stenosis with mean gradient 4 mmHg, mild-moderate AS, IVC normal.  8. Ascending aortic aneurysm: 4.3 cm by CTA in 11/16.  9. Pulmonary hypertension: PASP 65 mmHg by echo in 3/17.  CTA chest in 11/16 with no PE.  PFTs in 7/15 with mild obstructive lung disease.  10. Sleep study negative 2017. 11. Bradycardia: off nodal blockers.  - Holter (  8/20): Average HR 51, afib - 7 day Zio (11/23): AF average HR 54 bpm, 2 3-second pauses. 12. Aortic stenosis: Mild-moderate 7/23 echo. 13. Mitral stenosis: Mild 7/23 echo   Social History   Socioeconomic History   Marital status: Widowed    Spouse name: Not on file   Number of children: 1   Years of education: 86   Highest education level: 12th grade  Occupational History   Not on file  Tobacco Use   Smoking status: Never    Passive exposure: Never   Smokeless tobacco: Never  Vaping Use   Vaping  Use: Never used  Substance and Sexual Activity   Alcohol use: No   Drug use: No   Sexual activity: Not Currently    Partners: Male  Other Topics Concern   Not on file  Social History Narrative   Husband Braulio Conte, passed away in 02/01/2022. Pt's son, Sherlon Handing lives beside of patient and helps with her care.    Social Determinants of Health   Financial Resource Strain: Low Risk  (11/08/2022)   Overall Financial Resource Strain (CARDIA)    Difficulty of Paying Living Expenses: Not very hard  Recent Concern: Financial Resource Strain - Medium Risk (11/04/2022)   Overall Financial Resource Strain (CARDIA)    Difficulty of Paying Living Expenses: Somewhat hard  Food Insecurity: No Food Insecurity (11/08/2022)   Hunger Vital Sign    Worried About Running Out of Food in the Last Year: Never true    Ran Out of Food in the Last Year: Never true  Transportation Needs: No Transportation Needs (11/08/2022)   PRAPARE - Hydrologist (Medical): No    Lack of Transportation (Non-Medical): No  Physical Activity: Sufficiently Active (11/08/2022)   Exercise Vital Sign    Days of Exercise per Week: 5 days    Minutes of Exercise per Session: 30 min  Recent Concern: Physical Activity - Insufficiently Active (08/11/2022)   Exercise Vital Sign    Days of Exercise per Week: 5 days    Minutes of Exercise per Session: 10 min  Stress: No Stress Concern Present (11/08/2022)   [place]    Feeling of Stress : Only a little  Social Connections: Moderately Integrated (11/08/2022)   Social Connection and Isolation Panel [NHANES]    Frequency of Communication with Friends and Family: More than three times a week    Frequency of Social Gatherings with Friends and Family: More than three times a week    Attends Religious Services: More than 4 times per year    Active Member of [employer] or Organizations: Yes    Attends Theatre manager Meetings: More than 4 times per year    Marital Status: Widowed  Recent Concern: Social Connections - Moderately Isolated (08/11/2022)   Social Connection and Isolation Panel [NHANES]    Frequency of Communication with Friends and Family: More than three times a week    Frequency of Social Gatherings with Friends and Family: Twice a week    Attends Religious Services: More than 4 times per year    Active Member of [employer] or Organizations: No    Attends Archivist Meetings: Never    Marital Status: Widowed  Intimate Partner Violence: Not At Risk (11/08/2022)   Humiliation, Afraid, Rape, and Kick questionnaire    Fear of Current or Ex-Partner: No    Emotionally Abused: No    Physically Abused:  No    Sexually Abused: No   Family History  Problem Relation Age of Onset   Heart disease Mother    Heart attack Mother    Cancer Sister        stomach and colon   Heart disease Brother 63   Hypertension Father    Cancer Sister    Stroke Neg Hx    ROS: All systems reviewed and negative except as per HPI.   Current Outpatient Medications  Medication Sig Dispense Refill   acetaminophen (TYLENOL) 500 MG tablet Take 1,000 mg by mouth every 6 (six) hours as needed for headache or moderate pain.     albuterol (VENTOLIN HFA) 108 (90 Base) MCG/ACT inhaler Inhale 2 puffs into the lungs every 6 (six) hours as needed for wheezing or shortness of breath. 8 g 0   amLODipine (NORVASC) 5 MG tablet Take 1 tablet (5 mg total) by mouth daily. 90 tablet 3   Azelastine HCl 137 MCG/SPRAY SOLN Place 1 spray into both nostrils 2 (two) times daily as needed (congestion).     Cholecalciferol (VITAMIN D3) 25 MCG (1000 UT) CAPS Take 1,000 Units by mouth once a week.     clonazePAM (KLONOPIN) 0.5 MG tablet Take 1 tablet by mouth twice daily as needed for anxiety 60 tablet 0   cloNIDine (CATAPRES) 0.1 MG tablet Take 1 tablet by mouth twice daily 180 tablet 0   famotidine (PEPCID) 20 MG tablet Take 20  mg by mouth at bedtime as needed for heartburn.     ferrous sulfate 325 (65 FE) MG tablet Take 1 tablet (325 mg total) by mouth daily with breakfast. 90 tablet 3   fluticasone (FLONASE) 50 MCG/ACT nasal spray Place 1 spray into both nostrils daily as needed for allergies or rhinitis.     losartan (COZAAR) 100 MG tablet Take 1 tablet by mouth once daily 90 tablet 0   metolazone (ZAROXOLYN) 2.5 MG tablet Take 1 tablet (2.5 mg total) by mouth once a week. Take every Thursday 30 tablet 3   nitroGLYCERIN (NITROSTAT) 0.4 MG SL tablet DISSOLVE ONE TABLET UNDER THE TONGUE EVERY 5 MINUTES AS NEEDED FOR CHEST PAIN.  DO NOT EXCEED A TOTAL OF 3 DOSES IN 15 MINUTES 25 tablet 8   ondansetron (ZOFRAN-ODT) 4 MG disintegrating tablet Take 1 tablet (4 mg total) by mouth every 8 (eight) hours as needed for nausea or vomiting. 20 tablet 0   potassium chloride (KLOR-CON) 10 MEQ tablet Take 2 tablets (20 mEq total) by mouth once a week. Take every Thursday 35 tablet 3   ranolazine (RANEXA) 500 MG 12 hr tablet Take 500 mg by mouth as needed.     spironolactone (ALDACTONE) 25 MG tablet Take 1 tablet by mouth once daily 90 tablet 2   torsemide (DEMADEX) 20 MG tablet Take 80 mg by mouth 2 (two) times daily.     apixaban (ELIQUIS) 5 MG TABS tablet Take 1 tablet (5 mg total) by mouth 2 (two) times daily. 60 tablet 11   levocetirizine (XYZAL ALLERGY 24HR) 5 MG tablet Take 1 tablet (5 mg total) by mouth every evening. (Patient not taking: Reported on 11/10/2022) 30 tablet 0   No current facility-administered medications for this encounter.   BP (!) 110/50   Pulse (!) 55   Wt 80.7 kg (178 lb)   SpO2 95%   BMI 31.53 kg/m    Wt Readings from Last 3 Encounters:  11/10/22 80.7 kg (178 lb)  11/09/22 79.4 kg (175  lb)  11/09/22 79.5 kg (175 lb 3.2 oz)  General: NAD Neck: JVP 10 cm, no thyromegaly or thyroid nodule.  Lungs: Clear to auscultation bilaterally with normal respiratory effort. CV: Nondisplaced PMI.  Heart irregular  S1/S2, no S3/S4, 3/6 SEM RUSB.  2+ edema 1/2 to knees bilaterally.  No carotid bruit.  Difficult to palpate pedal pulses.  Abdomen: Soft, nontender, no hepatosplenomegaly, no distention.  Skin: Intact without lesions or rashes.  Neurologic: Alert and oriented x 3.  Psych: Normal affect. Extremities: No clubbing or cyanosis.  HEENT: Normal.   Assessment/Plan: 1.  CAD: Nonobstructive on 4/22 cath. She has been intolerant of statins and Zetia.   She has had atypical chest pain in the past. Admission 7/23 with CP, elevated HsT, CT ruled out acute aortic syndrome, echo ok. Pain felt to be related to coronary microvascular dysfunction.   - No ASA given apixaban use.  - Intolerant of statins and Zetia as above, does not want referral to lipid clinic.  2. Chronic atrial fibrillation/bradycardia: She is off nodal blockade. - Continue Eliquis, based on last BMET 5 mg bid is the correct dosing.    3. HTN: BP controlled.  4. Chronic diastolic CHF: NYHA class III symptoms, slowly progressive over time.  Mild volume overload on exam.  RHC/LHC in 4/22 showed nonobstructive CAD; mean RA 5, PA 44/10, mean PCWP 20, CI 4.89, PVR < 1 WU (pulmonary venous hypertension).  Echo in 8/22 showed EF up to 65-70%, mild LVH, normal RV, PASP 67, moderate RAE, trivial MR. PYP scan in 9/22 was probably negative. Echo in 7/23 showed EF 60-65%, mild LVH, moderate RV enlargement, normal RV systolic function, PASP 50 mmHg, mild mitral stenosis with mean gradient 4 mmHg, mild-moderate AS, IVC normal. NYHA class III symptoms, she does look volume overloaded on exam.  - Continue torsemide 80 mg bid and will add metolazone 2.5 mg weekly on Thursdays. Start KCl 20 every Thursday with metolazone. BMET/BNP today and again in 10 days.  - Continue losartan 100 mg daily.  - Continue spiro 25 mg daily. - Unable to tolerate Jardiance due to frequent UTIs.  - she has declined Cardiomems 5. Aortic stenosis: Mild-moderate on last echo.    Follow up 3 wks with APP.   Loralie Champagne,  11/11/2022

## 2022-11-15 ENCOUNTER — Ambulatory Visit: Payer: Medicare Other | Admitting: Student in an Organized Health Care Education/Training Program

## 2022-11-15 ENCOUNTER — Encounter: Payer: Self-pay | Admitting: Student in an Organized Health Care Education/Training Program

## 2022-11-15 ENCOUNTER — Encounter: Payer: Medicare Other | Admitting: *Deleted

## 2022-11-15 VITALS — BP 136/68 | HR 72 | Temp 97.8°F | Ht 63.0 in | Wt 173.6 lb

## 2022-11-15 DIAGNOSIS — R0602 Shortness of breath: Secondary | ICD-10-CM

## 2022-11-15 LAB — NITRIC OXIDE: Nitric Oxide: 15

## 2022-11-15 MED ORDER — BUDESONIDE-FORMOTEROL FUMARATE 80-4.5 MCG/ACT IN AERO: 2.0000 | INHALATION_SPRAY | Freq: Two times a day (BID) | RESPIRATORY_TRACT | 12 refills | Status: DC

## 2022-11-15 NOTE — Progress Notes (Signed)
Synopsis: Referred in for shortness of breath by [name], MD  Assessment & Plan:   1. SOB (shortness of breath)  Presents for the evaluation of shortness of breath in the setting of known heart failure with preserved ejection fraction. She does endorse wheezing that improves with the use of albuterol. Her symptoms are overall driven by her heart failure, but the finding of bronchodilator responsiveness on PFT's from 2020 raises the spectre of a reactive airway disease/asthma. This is further supported by her symptomatic improvement with albuterol. Given this, I will start her on a trial of Symbicort two puffs twice daily and re-evaluate symptoms on follow up. Given her PFT's mostly showed obstruction and her chest CT doesn't show signs of ILD on my review, I will hold off on obtaining further diagnostic workup. Patient will continue to follow up with cardiology for the optimization of her heart failure.  - budesonide-formoterol (SYMBICORT) 80-4.5 MCG/ACT inhaler; Inhale 2 puffs into the lungs in the morning and at bedtime.  Dispense: 1 each; Refill: 12 - FENO at 15 ppb   Return in about 6 months (around 05/18/2023).  I spent 60 minutes caring for this patient today, including preparing to see the patient, obtaining a medical history , reviewing a separately obtained history, performing a medically appropriate examination and/or evaluation, counseling and educating the patient/family/caregiver, ordering medications, tests, or procedures, documenting clinical information in the electronic health record, and independently interpreting results (not separately reported/billed) and communicating results to the patient/family/caregiver  Armando Reichert, MD [facility] 11/15/2022 9:18 PM    End of visit medications:  Meds ordered this encounter  Medications   budesonide-formoterol (SYMBICORT) 80-4.5 MCG/ACT inhaler    Sig: Inhale 2 puffs into the lungs in the morning  and at bedtime.    Dispense:  1 each    Refill:  12     Current Outpatient Medications:    acetaminophen (TYLENOL) 500 MG tablet, Take 1,000 mg by mouth every 6 (six) hours as needed for headache or moderate pain., Disp: , Rfl:    albuterol (VENTOLIN HFA) 108 (90 Base) MCG/ACT inhaler, Inhale 2 puffs into the lungs every 6 (six) hours as needed for wheezing or shortness of breath., Disp: 8 g, Rfl: 0   amLODipine (NORVASC) 5 MG tablet, Take 1 tablet (5 mg total) by mouth daily., Disp: 90 tablet, Rfl: 3   apixaban (ELIQUIS) 5 MG TABS tablet, Take 1 tablet (5 mg total) by mouth 2 (two) times daily., Disp: 60 tablet, Rfl: 11   Azelastine HCl 137 MCG/SPRAY SOLN, Place 1 spray into both nostrils 2 (two) times daily as needed (congestion)., Disp: , Rfl:    budesonide-formoterol (SYMBICORT) 80-4.5 MCG/ACT inhaler, Inhale 2 puffs into the lungs in the morning and at bedtime., Disp: 1 each, Rfl: 12   Cholecalciferol (VITAMIN D3) 25 MCG (1000 UT) CAPS, Take 1,000 Units by mouth once a week., Disp: , Rfl:    clonazePAM (KLONOPIN) 0.5 MG tablet, Take 1 tablet by mouth twice daily as needed for anxiety, Disp: 60 tablet, Rfl: 0   cloNIDine (CATAPRES) 0.1 MG tablet, Take 1 tablet by mouth twice daily, Disp: 180 tablet, Rfl: 0   famotidine (PEPCID) 20 MG tablet, Take 20 mg by mouth at bedtime as needed for heartburn., Disp: , Rfl:    ferrous sulfate 325 (65 FE) MG tablet, Take 1 tablet (325 mg total) by mouth daily with breakfast., Disp: 90 tablet, Rfl: 3   fluticasone (FLONASE) 50 MCG/ACT nasal  spray, Place 1 spray into both nostrils daily as needed for allergies or rhinitis., Disp: , Rfl:    levocetirizine (XYZAL ALLERGY 24HR) 5 MG tablet, Take 1 tablet (5 mg total) by mouth every evening., Disp: 30 tablet, Rfl: 0   losartan (COZAAR) 100 MG tablet, Take 1 tablet by mouth once daily, Disp: 90 tablet, Rfl: 0   metolazone (ZAROXOLYN) 2.5 MG tablet, Take 1 tablet (2.5 mg total) by mouth once a week. Take every  Thursday, Disp: 30 tablet, Rfl: 3   nitroGLYCERIN (NITROSTAT) 0.4 MG SL tablet, DISSOLVE ONE TABLET UNDER THE TONGUE EVERY 5 MINUTES AS NEEDED FOR CHEST PAIN.  DO NOT EXCEED A TOTAL OF 3 DOSES IN 15 MINUTES, Disp: 25 tablet, Rfl: 8   ondansetron (ZOFRAN-ODT) 4 MG disintegrating tablet, Take 1 tablet (4 mg total) by mouth every 8 (eight) hours as needed for nausea or vomiting., Disp: 20 tablet, Rfl: 0   potassium chloride (KLOR-CON) 10 MEQ tablet, Take 2 tablets (20 mEq total) by mouth once a week. Take every Thursday, Disp: 35 tablet, Rfl: 3   ranolazine (RANEXA) 500 MG 12 hr tablet, Take 500 mg by mouth as needed., Disp: , Rfl:    spironolactone (ALDACTONE) 25 MG tablet, Take 1 tablet by mouth once daily, Disp: 90 tablet, Rfl: 2   torsemide (DEMADEX) 20 MG tablet, Take 80 mg by mouth 2 (two) times daily., Disp: , Rfl:    Subjective:   PATIENT ID: [name] GENDER: female DOB: June 23, 1935, MRN: [id number]  Chief Complaint  Patient presents with   pulmonary consult    SOB with exertion, prod cough with white sputum and occ wheezing.     HPI  [name] is a pleasant 88 year old female with a history of HFpEF presenting to day for an evaluation. She was given a referral to pulmonary but is unsure why.  Patient has chronic shortness of breath, mostly with exertion. She feels well at rest and has no symptoms. She doesn't report any chest pain or chest tightness. She sometimes has a wheeze that resolves with the use of her albuterol inhaler. She last used her albuterol today. She denies any cough or sputum production, and has not had any hemoptysis. Her weight has been stable, and she denies any night sweats. Her lower extremity edema has improved since seeing her cardiologist and having her diuretic dose increased. Most recent Tamora from 2022 showed a PCWP of 20, PVR < 1 wood units, and CI of 4.89.   Patient has a long standing history of heart failure, afib, and prior takotsubo cardiomyopathy and  follows closely with cardiology. She is on goal directed medical therapy in addition to torsemide bid and metolazone weekly. She is also on Eliquis for Afib. She was previously seen by pulmonology in 2020 for shortness of breath. At that time, PFT's showed obstructive lung disease with a significant response to bronchodilators.  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 She is a lifelong non-smoker. 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  Ancillary information including prior medications, full medical/surgical/family/social histories, and PFTs (when available) are listed below and have been reviewed.   Review of Systems  Constitutional:  Negative for chills, fever, malaise/fatigue and weight loss.  Respiratory:  Positive for shortness of breath and wheezing. Negative for cough, hemoptysis and sputum production.   Cardiovascular:  Negative for chest pain.     Objective:   Vitals:   11/15/22 1125  BP: 136/68  Pulse: 72  Temp: 97.8 F (36.6 C)  TempSrc: Temporal  SpO2: 95%  Weight: 173 lb 9.6 oz (78.7 kg)  Height: '5\' 3"'$  (1.6 m)   95% on RA  BMI Readings from Last 3 Encounters:  11/15/22 30.75 kg/m  11/10/22 31.53 kg/m  11/09/22 31.00 kg/m   Wt Readings from Last 3 Encounters:  11/15/22 173 lb 9.6 oz (78.7 kg)  11/10/22 178 lb (80.7 kg)  11/09/22 175 lb (79.4 kg)    Physical Exam Constitutional:      Appearance: Normal appearance. She is not ill-appearing.  HENT:     Head: Normocephalic.  Cardiovascular:     Rate and Rhythm: Normal rate. Rhythm irregular.     Pulses: Normal pulses.     Heart sounds: Murmur (systolic ejection over the left upper sternal border) heard.  Pulmonary:     Effort: Pulmonary effort is normal.     Breath sounds: Normal breath sounds. No wheezing, rhonchi or rales.  Abdominal:     Palpations: Abdomen is soft.  Musculoskeletal:     Cervical back: Normal range of motion and neck supple.     Right lower leg: Edema  present.     Left lower leg: Edema present.  Neurological:     General: No focal deficit present.     Mental Status: She is alert and oriented to person, place, and time.     Ancillary Information    Past Medical History:  Diagnosis Date   Allergy    rhinitis   Aortic stenosis    mild by echo 06/2017   Arthritis    Bradycardia    a. 10/2017 -> beta blocker cut back due to HR 39.   Breast cancer ([place]) 01/06/2012   Cancer ([place])    right colon and left breast   Chronic diastolic CHF (congestive heart failure) ([place])    Colon cancer ([place]) 01/06/2012   Colovesical fistula    Dr. Marlou Starks and [name] planning surgery 08/2018- surgery revealed spontaneous closure   COPD (chronic obstructive pulmonary disease) ([place])    pt. denies   Coronary artery disease 2006   a.  NSTEMI in 2016, cath showed 15% prox-mid RCA, 20% prox LAD, EF 25-35% by cath and 35-40% -> felt due to Takotsubo cardiomyopathy.   Dilated aortic root ([place])    35mHg by echo 06/2017   Diverticulosis    Dyspnea    Edema extremities    GERD (gastroesophageal reflux disease)    Hernia    Hiatal hernia    denies   Hyperlipidemia    Hypertension    Mild aortic stenosis    echo 11/2015 but not noted on echo 06/2016   Osteopenia    Permanent atrial fibrillation (H[facility])    chronic atrial fibrillation   Pneumonia    hx child   [place] [place]    a. moderate to severe PASP 663mg echo 11/2015 - now 418m by echo 06/2017. CTA chest in 11/16 with no PE. PFTs in 7/15 with mild obstructive lung disease. She had a negative sleep study in 2017. b. Felt due to left sided HF.   [facility]  Takotsubo syndrome 07/29/2015   a. EF 35-40% by echo; akinesis of mid-apical anteroseptal and apical myocardium.  EF now normalized on echo 11/2015     Family History  Problem Relation Age of Onset   Heart disease Mother    Heart attack Mother    Cancer Sister  stomach and colon   Heart disease Brother 89   Hypertension Father     Cancer Sister    Stroke Neg Hx      Past Surgical History:  Procedure Laterality Date   APPENDECTOMY     BREAST SURGERY     lumpectomy left   CARDIAC CATHETERIZATION     CARDIAC CATHETERIZATION N/A 07/28/2015   Procedure: Left Heart Cath and Coronary Angiography;  Surgeon: Peter M Martinique, MD;  Location: [facility];  Service: Cardiovascular;  Laterality: N/A;   CHOLECYSTECTOMY     COLECTOMY     right side   CYSTOSCOPY WITH STENT PLACEMENT Left 09/04/2018   Procedure: CYSTOSCOPY WITH LEFT STENT PLACEMENT, BLADDER REPAIR, CYSTOSCOPY WITH LEFT STENT REMOVAL;  Surgeon: Raynelle Bring, MD;  Location: [facility];  Service: Urology;  Laterality: Left;   EXCISION OF ACCESSORY NIPPLE Bilateral 05/30/2013   Procedure: BILATERAL NIPPLE BIOPSY;  Surgeon: Merrie Roof, MD;  Location: [facility];  Service: General;  Laterality: Bilateral;   EYE SURGERY Bilateral 12   cataracts   HYSTEROSCOPY WITH D & C N/A 05/29/2020   Procedure: DILATATION AND CURETTAGE /HYSTEROSCOPY;  Surgeon: Homero Fellers, MD;  Location: [facility];  Service: Gynecology;  Laterality: N/A;   IR RADIOLOGIST EVAL & MGMT  07/26/2017   LAPAROSCOPIC RIGHT COLECTOMY N/A 09/04/2018   Procedure: LAPAROSCOPIC ASSISTED SIGMOID COLECTOMY WITH REPAIR OF FISTULA TO BLADDER;  Surgeon: Jovita Kussmaul, MD;  Location: [facility];  Service: General;  Laterality: N/A;   RIGHT/LEFT HEART CATH AND CORONARY ANGIOGRAPHY N/A 12/10/2020   Procedure: RIGHT/LEFT HEART CATH AND CORONARY ANGIOGRAPHY;  Surgeon: Larey Dresser, MD;  Location: [facility];  Service: Cardiovascular;  Laterality: N/A;   SPLIT NIGHT STUDY  02/02/2016        Social History   Socioeconomic History   Marital status: Widowed    Spouse name: Not on file   Number of children: 1   Years of education: 11   Highest education level: 12th grade  Occupational History   Not on file  Tobacco Use   Smoking status: Never    Passive exposure: Never   Smokeless tobacco:  Never  Vaping Use   Vaping Use: Never used  Substance and Sexual Activity   Alcohol use: No   Drug use: No   Sexual activity: Not Currently    Partners: Male  Other Topics Concern   Not on file  Social History Narrative   Husband Braulio Conte, passed away in 02-04-2022. Pt's son, Sherlon Handing lives beside of patient and helps with her care.    Social Determinants of Health   Financial Resource Strain: Low Risk  (11/08/2022)   Overall Financial Resource Strain (CARDIA)    Difficulty of Paying Living Expenses: Not very hard  Recent Concern: Financial Resource Strain - Medium Risk (11/04/2022)   Overall Financial Resource Strain (CARDIA)    Difficulty of Paying Living Expenses: Somewhat hard  Food Insecurity: No Food Insecurity (11/08/2022)   Hunger Vital Sign    Worried About Running Out of Food in the Last Year: Never true    Ran Out of Food in the Last Year: Never true  Transportation Needs: No Transportation Needs (11/08/2022)   PRAPARE - Hydrologist (Medical): No    Lack of Transportation (Non-Medical): No  Physical Activity: Sufficiently Active (11/08/2022)   Exercise Vital Sign    Days of Exercise per Week: 5 days  Minutes of Exercise per Session: 30 min  Recent Concern: Physical Activity - Insufficiently Active (08/11/2022)   Exercise Vital Sign    Days of Exercise per Week: 5 days    Minutes of Exercise per Session: 10 min  Stress: No Stress Concern Present (11/08/2022)   [place]    Feeling of Stress : Only a little  Social Connections: Moderately Integrated (11/08/2022)   Social Connection and Isolation Panel [NHANES]    Frequency of Communication with Friends and Family: More than three times a week    Frequency of Social Gatherings with Friends and Family: More than three times a week    Attends Religious Services: More than 4 times per year    Active Member of [employer] or Organizations:  Yes    Attends Archivist Meetings: More than 4 times per year    Marital Status: Widowed  Recent Concern: Social Connections - Moderately Isolated (08/11/2022)   Social Connection and Isolation Panel [NHANES]    Frequency of Communication with Friends and Family: More than three times a week    Frequency of Social Gatherings with Friends and Family: Twice a week    Attends Religious Services: More than 4 times per year    Active Member of [employer] or Organizations: No    Attends Archivist Meetings: Never    Marital Status: Widowed  Intimate Partner Violence: Not At Risk (11/08/2022)   Humiliation, Afraid, Rape, and Kick questionnaire    Fear of Current or Ex-Partner: No    Emotionally Abused: No    Physically Abused: No    Sexually Abused: No     Allergies  Allergen Reactions   Contrast Media [Iodinated Contrast Media] Hives and Other (See Comments)    Per pt strong burning sensation starting in chest radiating outward    Clonidine Derivatives Other (See Comments)    Throat dry   Statins Other (See Comments)    "bones hurt"   Sulfa Antibiotics Diarrhea    Tremors    Celebrex [Celecoxib] Rash   Isosorbide Nitrate Itching and Rash   Other Itching and Rash    Plastic and paper tape and heart monitor pads   Tape Itching and Rash    Red Where applied and will spread     CBC    Component Value Date/Time   WBC 5.2 10/29/2022 1244   RBC 3.40 (L) 10/29/2022 1244   HGB 10.3 (L) 10/29/2022 1244   HGB 12.8 11/29/2016 1107   HGB 12.4 08/08/2014 0950   HCT 33.8 (L) 10/29/2022 1244   HCT 39.1 11/29/2016 1107   HCT 38.7 08/08/2014 0950   PLT 226 10/29/2022 1244   PLT 248 11/29/2016 1107   MCV 99.4 10/29/2022 1244   MCV 91 11/29/2016 1107   MCV 93.0 08/08/2014 0950   MCH 30.3 10/29/2022 1244   MCHC 30.5 10/29/2022 1244   RDW 14.7 10/29/2022 1244   RDW 14.1 11/29/2016 1107   RDW 13.5 08/08/2014 0950   LYMPHSABS 1,842 09/28/2022 0902   LYMPHSABS 3.0 11/29/2016  1107   LYMPHSABS 2.4 08/08/2014 0950   MONOABS 1.1 (H) 08/31/2022 1508   MONOABS 0.7 08/08/2014 0950   EOSABS 92 09/28/2022 0902   EOSABS 0.1 11/29/2016 1107   EOSABS 0.1 04/09/2010 1247   BASOSABS 18 09/28/2022 0902   BASOSABS 0.0 11/29/2016 1107   BASOSABS 0.0 08/08/2014 0950    Pulmonary Functions Testing Results:    Latest Ref Rng &  Units 10/06/2018   10:26 AM 04/05/2014    2:54 PM  PFT Results  FVC-Pre L 1.58  2.07   FVC-Predicted Pre % 61  71   FVC-Post L 1.92  2.28   FVC-Predicted Post % 74  79   Pre FEV1/FVC % % 73  72   Post FEV1/FCV % % 72  73   FEV1-Pre L 1.16  1.48   FEV1-Predicted Pre % 60  68   FEV1-Post L 1.39  1.67   DLCO uncorrected ml/min/mmHg 13.43  17.96   DLCO UNC% % 55  69   DLCO corrected ml/min/mmHg 15.02    DLCO COR %Predicted % 61    DLVA Predicted % 90  97   TLC L 4.74  4.73   TLC % Predicted % 93  90   RV % Predicted % 133  108     Outpatient Medications Prior to Visit  Medication Sig Dispense Refill   acetaminophen (TYLENOL) 500 MG tablet Take 1,000 mg by mouth every 6 (six) hours as needed for headache or moderate pain.     albuterol (VENTOLIN HFA) 108 (90 Base) MCG/ACT inhaler Inhale 2 puffs into the lungs every 6 (six) hours as needed for wheezing or shortness of breath. 8 g 0   amLODipine (NORVASC) 5 MG tablet Take 1 tablet (5 mg total) by mouth daily. 90 tablet 3   apixaban (ELIQUIS) 5 MG TABS tablet Take 1 tablet (5 mg total) by mouth 2 (two) times daily. 60 tablet 11   Azelastine HCl 137 MCG/SPRAY SOLN Place 1 spray into both nostrils 2 (two) times daily as needed (congestion).     Cholecalciferol (VITAMIN D3) 25 MCG (1000 UT) CAPS Take 1,000 Units by mouth once a week.     clonazePAM (KLONOPIN) 0.5 MG tablet Take 1 tablet by mouth twice daily as needed for anxiety 60 tablet 0   cloNIDine (CATAPRES) 0.1 MG tablet Take 1 tablet by mouth twice daily 180 tablet 0   famotidine (PEPCID) 20 MG tablet Take 20 mg by mouth at bedtime as needed  for heartburn.     ferrous sulfate 325 (65 FE) MG tablet Take 1 tablet (325 mg total) by mouth daily with breakfast. 90 tablet 3   fluticasone (FLONASE) 50 MCG/ACT nasal spray Place 1 spray into both nostrils daily as needed for allergies or rhinitis.     levocetirizine (XYZAL ALLERGY 24HR) 5 MG tablet Take 1 tablet (5 mg total) by mouth every evening. 30 tablet 0   losartan (COZAAR) 100 MG tablet Take 1 tablet by mouth once daily 90 tablet 0   metolazone (ZAROXOLYN) 2.5 MG tablet Take 1 tablet (2.5 mg total) by mouth once a week. Take every Thursday 30 tablet 3   nitroGLYCERIN (NITROSTAT) 0.4 MG SL tablet DISSOLVE ONE TABLET UNDER THE TONGUE EVERY 5 MINUTES AS NEEDED FOR CHEST PAIN.  DO NOT EXCEED A TOTAL OF 3 DOSES IN 15 MINUTES 25 tablet 8   ondansetron (ZOFRAN-ODT) 4 MG disintegrating tablet Take 1 tablet (4 mg total) by mouth every 8 (eight) hours as needed for nausea or vomiting. 20 tablet 0   potassium chloride (KLOR-CON) 10 MEQ tablet Take 2 tablets (20 mEq total) by mouth once a week. Take every Thursday 35 tablet 3   ranolazine (RANEXA) 500 MG 12 hr tablet Take 500 mg by mouth as needed.     spironolactone (ALDACTONE) 25 MG tablet Take 1 tablet by mouth once daily 90 tablet 2   torsemide (DEMADEX)  20 MG tablet Take 80 mg by mouth 2 (two) times daily.     No facility-administered medications prior to visit.

## 2022-11-15 NOTE — Patient Instructions (Signed)
I have prescribed you Symbicort. I want you to use it as follows: -2 puffs once daily in the morning -2 puffs every 4 hours as needed for wheezing -wash your mouth after every use

## 2022-11-16 ENCOUNTER — Encounter: Payer: Self-pay | Admitting: *Deleted

## 2022-11-16 ENCOUNTER — Other Ambulatory Visit: Payer: Self-pay | Admitting: Family Medicine

## 2022-11-16 ENCOUNTER — Ambulatory Visit: Payer: Self-pay | Admitting: *Deleted

## 2022-11-16 DIAGNOSIS — I4891 Unspecified atrial fibrillation: Secondary | ICD-10-CM | POA: Diagnosis not present

## 2022-11-16 DIAGNOSIS — Z8673 Personal history of transient ischemic attack (TIA), and cerebral infarction without residual deficits: Secondary | ICD-10-CM | POA: Diagnosis not present

## 2022-11-16 DIAGNOSIS — C50919 Malignant neoplasm of unspecified site of unspecified female breast: Secondary | ICD-10-CM | POA: Diagnosis not present

## 2022-11-16 DIAGNOSIS — M545 Low back pain, unspecified: Secondary | ICD-10-CM | POA: Diagnosis not present

## 2022-11-16 DIAGNOSIS — Z6831 Body mass index (BMI) 31.0-31.9, adult: Secondary | ICD-10-CM | POA: Diagnosis not present

## 2022-11-16 DIAGNOSIS — Z9181 History of falling: Secondary | ICD-10-CM | POA: Diagnosis not present

## 2022-11-16 DIAGNOSIS — I110 Hypertensive heart disease with heart failure: Secondary | ICD-10-CM | POA: Diagnosis not present

## 2022-11-16 DIAGNOSIS — E669 Obesity, unspecified: Secondary | ICD-10-CM | POA: Diagnosis not present

## 2022-11-16 DIAGNOSIS — E785 Hyperlipidemia, unspecified: Secondary | ICD-10-CM | POA: Diagnosis not present

## 2022-11-16 DIAGNOSIS — G8929 Other chronic pain: Secondary | ICD-10-CM | POA: Diagnosis not present

## 2022-11-16 DIAGNOSIS — Z7901 Long term (current) use of anticoagulants: Secondary | ICD-10-CM | POA: Diagnosis not present

## 2022-11-16 DIAGNOSIS — I509 Heart failure, unspecified: Secondary | ICD-10-CM | POA: Diagnosis not present

## 2022-11-16 DIAGNOSIS — K219 Gastro-esophageal reflux disease without esophagitis: Secondary | ICD-10-CM | POA: Diagnosis not present

## 2022-11-16 NOTE — Patient Instructions (Signed)
Visit Information  Thank you for taking time to visit with me today. Please don't hesitate to contact me if I can be of assistance to you.   Following are the goals we discussed today:   Goals Addressed               This Visit's Progress     Provide Counseling & Supportive Services. (pt-stated)   On track     Care Coordination Interventions:  Interventions Today    Flowsheet Row Most Recent Value  Chronic Disease   Chronic disease during today's visit Atrial Fibrillation (AFib), Hypertension (HTN), Congestive Heart Failure (CHF), Chronic Kidney Disease/End Stage Renal Disease (ESRD), Chronic Obstructive Pulmonary Disease (COPD), Other  [Feelings of Sadness & Frustration Regarding Loss of Independence.]  General Interventions   General Interventions Discussed/Reviewed General Interventions Discussed, General Interventions Reviewed, Annual Eye Exam, Durable Medical Equipment (DME), Vaccines, Health Screening, Community Resources, Level of Care, Communication with, Doctor Visits  [Primary Care Provider]  Vaccines COVID-19, Flu, Pneumonia, RSV, Shingles, Tetanus/Pertussis/Diphtheria  [Encouraged]  Doctor Visits Discussed/Reviewed Doctor Visits Discussed, Doctor Visits Reviewed, Annual Wellness Visits, PCP, Specialist  [Encouraged]  Health Screening Bone Density, Colonoscopy, Mammogram  [Encouraged]  Durable Medical Equipment (DME) BP Cuff  PCP/Specialist Visits Compliance with follow-up visit  [Encouraged]  Communication with PCP/Specialists, RN  Level of Care Adult Daycare, Applications, Assisted Living, Personal Care Services  Applications Medicaid, Personal Care Services  Exercise Interventions   Exercise Discussed/Reviewed Exercise Discussed, Exercise Reviewed, Physical Activity, Weight Managment, Assistive device use and maintanence  [Encouraged]  Physical Activity Discussed/Reviewed Physical Activity Discussed, Physical Activity Reviewed, Types of exercise, Home Exercise Program  (HEP)  [Encouraged]  Weight Management Weight maintenance  [Encouraged]  Education Interventions   Education Provided Provided Engineer, site, Provided Education  Provided Verbal Education On Nutrition, Foot Care, Eye Care, Mental Health/Coping with Illness, Applications, Exercise, Medication, Insurance Plans, [employer], When to see the doctor  Applications Medicaid, [place] Discussed, Mental Health Reviewed, Coping Strategies, Suicide, Crisis, Substance Abuse, Grief and Loss, Depression, Anxiety  Refer to Social Work for counseling regarding Anxiety/Coping, Depression, Grief and Loss  Nutrition Interventions   Nutrition Discussed/Reviewed Nutrition Discussed, Nutrition Reviewed, Portion sizes, Decreasing sugar intake, Decreasing salt, Decreasing fats, Increaing proteins, Fluid intake, Adding fruits and vegetables  [Encouraged]  Pharmacy Interventions   Pharmacy Dicussed/Reviewed Pharmacy Topics Discussed, Pharmacy Topics Reviewed, Medication Adherence, Affording Medications  Safety Interventions   Safety Discussed/Reviewed Safety Discussed, Safety Reviewed, Fall Risk, Home Safety  [Encouraged]  Home Herbalist, Refer for community resources  Advanced Directive Interventions   Advanced Directives Discussed/Reviewed Advanced Directives Discussed, Advanced Directives Reviewed     Active Listening & Reflection Utilized.  Verbalization of Feelings Encouraged.  Emotional Support Offered. Feelings of Grief Validated, with Regards to Loss of Husband 01/2022. Grief & Loss Support Groups Discussed. Self-Enrollment in Grief & Loss Support Group Emphasized, from List Provided. Problem Solving Interventions Implemented. Task-Centered Solutions Activated.   Solution-Focused Strategies Developed. Acceptance & Commitment Therapy Indicated. Brief Cognitive Behavioral Therapy  Initiated. Client-Centered Therapy Enacted. Confirmed Receipt & Thoroughly Reviewed the Following List of [employer], Community education officer. ~ Adult Day Care Programs  ~ [place] ~ [place] ~ [place] ~ [place] Encouraged to [place], [place], in An Effort to [employer]  in The Home. Encouraged to Contact CSW Directly (# [phone]), if You Have Questions, Need Assistance, or If Additional Social Work Needs Are Identified Between Now & Our Next Scheduled Telephone [employer].      Our next appointment is by telephone on 12/01/2022 at 9:00 am.  Please call the care guide team at [phone] if you need to cancel or reschedule your appointment.   If you are experiencing a Mental Health or [place] or need someone to talk to, please call the Suicide and Crisis Lifeline: 988 call the [place] National Suicide Prevention Lifeline: 204-734-7389 or TTY: 8168465410 TTY 361-054-7634) to talk to a trained counselor call 1-800-273-TALK (toll free, 24 hour hotline) go to [facility] [address] [place] [phone]) call the [place]: [phone] call 911  Patient verbalizes understanding of instructions and care plan provided today and agrees to view in [place]. Active MyChart status and patient understanding of how to access instructions and care plan via MyChart confirmed with patient.     Telephone follow up appointment with care management team member scheduled for: 12/01/2022 at 9:00 am.  Nat Christen, BSW, MSW, [place]  Licensed Clinical Social Worker  [place]  Mailing [place]. [address], [place], [place] [address] Physical Address-[address],  [place], [place] [address] Toll Free Main # [phone] Fax # [phone] Cell # [phone] [email or web address]

## 2022-11-16 NOTE — Patient Outreach (Signed)
Care Coordination   Follow Up Visit Note   11/16/2022  Name: Xania Ganster Kinsella MRN: [id number] DOB: 1935/07/21  [name] Humann is a 88 y.o. year old female who sees [name], Cammie Mcgee, MD for primary care. I spoke with [name] Corbit by phone today.  What matters to the patients health and wellness today?   Provide Counseling & Supportive Services.   Goals Addressed               This Visit's Progress     Provide Counseling & Supportive Services. (pt-stated)   On track     Care Coordination Interventions:  Interventions Today    Flowsheet Row Most Recent Value  Chronic Disease   Chronic disease during today's visit Atrial Fibrillation (AFib), Hypertension (HTN), Congestive Heart Failure (CHF), Chronic Kidney Disease/End Stage Renal Disease (ESRD), Chronic Obstructive Pulmonary Disease (COPD), Other  [Feelings of Sadness & Frustration Regarding Loss of Independence.]  General Interventions   General Interventions Discussed/Reviewed General Interventions Discussed, General Interventions Reviewed, Annual Eye Exam, Durable Medical Equipment (DME), Vaccines, Health Screening, Community Resources, Level of Care, Communication with, Doctor Visits  [Primary Care Provider]  Vaccines COVID-19, Flu, Pneumonia, RSV, Shingles, Tetanus/Pertussis/Diphtheria  [Encouraged]  Doctor Visits Discussed/Reviewed Doctor Visits Discussed, Doctor Visits Reviewed, Annual Wellness Visits, PCP, Specialist  [Encouraged]  Health Screening Bone Density, Colonoscopy, Mammogram  [Encouraged]  Durable Medical Equipment (DME) BP Cuff  PCP/Specialist Visits Compliance with follow-up visit  [Encouraged]  Communication with PCP/Specialists, RN  Level of Care Adult Daycare, Applications, Assisted Living, Personal Care Services  Applications Medicaid, Personal Care Services  Exercise Interventions   Exercise Discussed/Reviewed Exercise Discussed, Exercise Reviewed, Physical Activity, Weight Managment, Assistive device use and  maintanence  [Encouraged]  Physical Activity Discussed/Reviewed Physical Activity Discussed, Physical Activity Reviewed, Types of exercise, Home Exercise Program (HEP)  [Encouraged]  Weight Management Weight maintenance  [Encouraged]  Education Interventions   Education Provided Provided Engineer, site, Provided Education  Provided Verbal Education On Nutrition, Foot Care, Eye Care, Mental Health/Coping with Illness, Applications, Exercise, Medication, Insurance Plans, [employer], When to see the doctor  Applications Medicaid, [place] Discussed, Mental Health Reviewed, Coping Strategies, Suicide, Crisis, Substance Abuse, Grief and Loss, Depression, Anxiety  Refer to Social Work for counseling regarding Anxiety/Coping, Depression, Grief and Loss  Nutrition Interventions   Nutrition Discussed/Reviewed Nutrition Discussed, Nutrition Reviewed, Portion sizes, Decreasing sugar intake, Decreasing salt, Decreasing fats, Increaing proteins, Fluid intake, Adding fruits and vegetables  [Encouraged]  Pharmacy Interventions   Pharmacy Dicussed/Reviewed Pharmacy Topics Discussed, Pharmacy Topics Reviewed, Medication Adherence, Affording Medications  Safety Interventions   Safety Discussed/Reviewed Safety Discussed, Safety Reviewed, Fall Risk, Home Safety  [Encouraged]  Home Herbalist, Refer for community resources  Advanced Directive Interventions   Advanced Directives Discussed/Reviewed Advanced Directives Discussed, Advanced Directives Reviewed     Active Listening & Reflection Utilized.  Verbalization of Feelings Encouraged.  Emotional Support Offered. Feelings of Grief Validated, with Regards to Loss of Husband 01/2022. Grief & Loss Support Groups Discussed. Self-Enrollment in Grief & Loss Support Group Emphasized, from List Provided. Problem Solving Interventions  Implemented. Task-Centered Solutions Activated.   Solution-Focused Strategies Developed. Acceptance & Commitment Therapy Indicated. Brief Cognitive Behavioral Therapy Initiated. Client-Centered Therapy Enacted. Confirmed Receipt & Thoroughly Reviewed the Following List of [employer], Community education officer. ~ Adult Day Care Programs  ~ [place] ~ [place] ~ [place] &  Facilities ~ Aging, Disability & Transit Services of [place] Encouraged to [employer] of [place], in An Effort to [employer] in The Home. Encouraged to Contact CSW Directly (# [phone]), if You Have Questions, Need Assistance, or If Additional Social Work Needs Are Identified Between Now & Our Next Scheduled Telephone [employer].      SDOH assessments and interventions completed:  Yes.  Care Coordination Interventions:  Yes, provided.   Follow up plan: Follow up call scheduled for 12/01/2022 at 9:00 am.  Encounter Outcome:  Pt. Visit Completed.   Nat Christen, BSW, MSW, LCSW  Licensed Education officer, [facility]  Mailing [place]. [address], [place], [place] [address] Physical Address-[address] [place], [place] [address] Toll Free Main # [phone] Fax # [phone] Cell # [phone] [email or web address]

## 2022-11-17 ENCOUNTER — Ambulatory Visit (INDEPENDENT_AMBULATORY_CARE_PROVIDER_SITE_OTHER): Payer: Medicare Other | Admitting: Family Medicine

## 2022-11-17 ENCOUNTER — Encounter: Payer: Self-pay | Admitting: Family Medicine

## 2022-11-17 ENCOUNTER — Ambulatory Visit
Admission: RE | Admit: 2022-11-17 | Discharge: 2022-11-17 | Disposition: A | Discharge status: Home / Self Care | Payer: Medicare Other | Source: Ambulatory Visit | Attending: Family Medicine | Admitting: Family Medicine

## 2022-11-17 ENCOUNTER — Telehealth: Payer: Self-pay

## 2022-11-17 VITALS — BP 140/50 | HR 76 | Temp 97.9°F | Ht 63.0 in | Wt 168.0 lb

## 2022-11-17 DIAGNOSIS — M8588 Other specified disorders of bone density and structure, other site: Secondary | ICD-10-CM | POA: Diagnosis not present

## 2022-11-17 DIAGNOSIS — M5441 Lumbago with sciatica, right side: Secondary | ICD-10-CM

## 2022-11-17 DIAGNOSIS — R1031 Right lower quadrant pain: Secondary | ICD-10-CM | POA: Diagnosis not present

## 2022-11-17 DIAGNOSIS — S32030A Wedge compression fracture of third lumbar vertebra, initial encounter for closed fracture: Secondary | ICD-10-CM | POA: Diagnosis not present

## 2022-11-17 DIAGNOSIS — M47816 Spondylosis without myelopathy or radiculopathy, lumbar region: Secondary | ICD-10-CM | POA: Diagnosis not present

## 2022-11-17 DIAGNOSIS — M545 Low back pain, unspecified: Secondary | ICD-10-CM | POA: Diagnosis not present

## 2022-11-17 DIAGNOSIS — M5442 Lumbago with sciatica, left side: Secondary | ICD-10-CM | POA: Diagnosis not present

## 2022-11-17 LAB — URINALYSIS, ROUTINE W REFLEX MICROSCOPIC
Bilirubin Urine: NEGATIVE
Glucose, UA: NEGATIVE
Hgb urine dipstick: NEGATIVE
Ketones, ur: NEGATIVE
Leukocytes,Ua: NEGATIVE
Nitrite: NEGATIVE
Protein, ur: NEGATIVE
Specific Gravity, Urine: 1.01 (ref 1.001–1.035)
pH: 6 (ref 5.0–8.0)

## 2022-11-17 MED ORDER — OXYCODONE HCL 5 MG PO TABS: 5.0000 mg | ORAL_TABLET | Freq: Four times a day (QID) | ORAL | 0 refills | Status: DC | PRN

## 2022-11-17 NOTE — Telephone Encounter (Signed)
Call pt advise pt that FNP has called in pain meds to  pharmacy for pain.  Pt voiced understanding.

## 2022-11-17 NOTE — Telephone Encounter (Signed)
Pt had an appt w/FNP this morning. Pt was sent to [facility] to have a CT STAT done. Called and spoke w/Leslie to schedule for today, pt needs to be there by 230pm, pt aware and voiced understanding.

## 2022-11-17 NOTE — Progress Notes (Unsigned)
Acute Office Visit  Subjective:     Patient ID: [name], female    DOB: 12-Feb-1935, 88 y.o.   MRN: [id number]  Chief Complaint  Patient presents with   Follow-up   Acute Visit    stomach hurting; going down back and to legs - JBG\\\      HPI Patient is in today for abdominal pain, back pain, and bilateral thigh pain and numbness for 1-2 days. She reports low back pain for weeks that is acutely worse today. She denies fall or injury, states she woke up this AM hurting so bad she could "hardly go to the bathroom" and was also having a hot flash. Has not had a BM today or yesterday, baseline constipation. Denies fever, dysuria, urinary frequency, nausea, vomiting, diarrhea, saddle numbness, incontinence.  Review of Systems  All other systems reviewed and are negative.   Past Medical History:  Diagnosis Date   Allergy    rhinitis   Aortic stenosis    mild by echo 06/2017   Arthritis    Bradycardia    a. 10/2017 -> beta blocker cut back due to HR 39.   Breast cancer ([place] [place]) 01/06/2012   Cancer ([place])    right colon and left breast   Chronic diastolic CHF (congestive heart failure) ([place])    Colon cancer ([place]) 01/06/2012   Colovesical fistula    Dr. Marlou Starks and [name] planning surgery 08/2018- surgery revealed spontaneous closure   COPD (chronic obstructive pulmonary disease) ([facility])    pt. denies   Coronary artery disease 2006   a.  NSTEMI in 2016, cath showed 15% prox-mid RCA, 20% prox LAD, EF 25-35% by cath and 35-40% -> felt due to Takotsubo cardiomyopathy.   Dilated aortic root ([facility])    89mHg by echo 06/2017   Diverticulosis    Dyspnea    Edema extremities    GERD (gastroesophageal reflux disease)    Hernia    Hiatal hernia    denies   Hyperlipidemia    Hypertension    Mild aortic stenosis    echo 11/2015 but not noted on echo 06/2016   Osteopenia    Permanent atrial fibrillation (HCC)    chronic atrial fibrillation   Pneumonia    hx child   Pulmonary HTN ([facility]     a. moderate to severe PASP 651mg echo 11/2015 - now 4118m by echo 06/2017. CTA chest in 11/16 with no PE. PFTs in 7/15 with mild obstructive lung disease. She had a negative sleep study in 2017. b. Felt due to left sided HF.   [facility]  Takotsubo syndrome 07/29/2015   a. EF 35-40% by echo; akinesis of mid-apical anteroseptal and apical myocardium.  EF now normalized on echo 11/2015   Past Surgical History:  Procedure Laterality Date   APPENDECTOMY     BREAST SURGERY     lumpectomy left   CARDIAC CATHETERIZATION     CARDIAC CATHETERIZATION N/A 07/28/2015   Procedure: Left Heart Cath and Coronary Angiography;  Surgeon: Peter M JorMartiniqueD;  Location: [facility];  Service: Cardiovascular;  Laterality: N/A;   CHOLECYSTECTOMY     COLECTOMY     right side   CYSTOSCOPY WITH STENT PLACEMENT Left 09/04/2018   Procedure: CYSTOSCOPY WITH LEFT STENT PLACEMENT, BLADDER REPAIR, CYSTOSCOPY WITH LEFT STENT REMOVAL;  Surgeon: BorRaynelle BringD;  Location: [facility];  Service: Urology;  Laterality: Left;   EXCISION OF ACCESSORY NIPPLE Bilateral 05/30/2013   Procedure:  BILATERAL NIPPLE BIOPSY;  Surgeon: Merrie Roof, MD;  Location: [facility];  Service: General;  Laterality: Bilateral;   EYE SURGERY Bilateral 12   cataracts   HYSTEROSCOPY WITH D & C N/A 05/29/2020   Procedure: DILATATION AND CURETTAGE /HYSTEROSCOPY;  Surgeon: Homero Fellers, MD;  Location: [facility];  Service: Gynecology;  Laterality: N/A;   IR RADIOLOGIST EVAL & MGMT  07/26/2017   LAPAROSCOPIC RIGHT COLECTOMY N/A 09/04/2018   Procedure: LAPAROSCOPIC ASSISTED SIGMOID COLECTOMY WITH REPAIR OF FISTULA TO BLADDER;  Surgeon: Jovita Kussmaul, MD;  Location: [facility];  Service: General;  Laterality: N/A;   RIGHT/LEFT HEART CATH AND CORONARY ANGIOGRAPHY N/A 12/10/2020   Procedure: RIGHT/LEFT HEART CATH AND CORONARY ANGIOGRAPHY;  Surgeon: Larey Dresser, MD;  Location: [facility];  Service: Cardiovascular;  Laterality: N/A;    SPLIT NIGHT STUDY  02/02/2016       Current Outpatient Medications on File Prior to Visit  Medication Sig Dispense Refill   acetaminophen (TYLENOL) 500 MG tablet Take 1,000 mg by mouth every 6 (six) hours as needed for headache or moderate pain.     albuterol (VENTOLIN HFA) 108 (90 Base) MCG/ACT inhaler Inhale 2 puffs into the lungs every 6 (six) hours as needed for wheezing or shortness of breath. 8 g 0   amLODipine (NORVASC) 5 MG tablet Take 1 tablet (5 mg total) by mouth daily. 90 tablet 3   apixaban (ELIQUIS) 5 MG TABS tablet Take 1 tablet (5 mg total) by mouth 2 (two) times daily. 60 tablet 11   Azelastine HCl 137 MCG/SPRAY SOLN Place 1 spray into both nostrils 2 (two) times daily as needed (congestion).     budesonide-formoterol (SYMBICORT) 80-4.5 MCG/ACT inhaler Inhale 2 puffs into the lungs in the morning and at bedtime. 1 each 12   Cholecalciferol (VITAMIN D3) 25 MCG (1000 UT) CAPS Take 1,000 Units by mouth once a week.     clonazePAM (KLONOPIN) 0.5 MG tablet Take 1 tablet by mouth twice daily as needed for anxiety 60 tablet 0   cloNIDine (CATAPRES) 0.1 MG tablet Take 1 tablet by mouth twice daily 180 tablet 0   famotidine (PEPCID) 20 MG tablet Take 20 mg by mouth at bedtime as needed for heartburn.     ferrous sulfate 325 (65 FE) MG tablet Take 1 tablet (325 mg total) by mouth daily with breakfast. 90 tablet 3   fluticasone (FLONASE) 50 MCG/ACT nasal spray Place 1 spray into both nostrils daily as needed for allergies or rhinitis.     losartan (COZAAR) 100 MG tablet Take 1 tablet by mouth once daily 90 tablet 0   metolazone (ZAROXOLYN) 2.5 MG tablet Take 1 tablet (2.5 mg total) by mouth once a week. Take every Thursday 30 tablet 3   nitroGLYCERIN (NITROSTAT) 0.4 MG SL tablet DISSOLVE ONE TABLET UNDER THE TONGUE EVERY 5 MINUTES AS NEEDED FOR CHEST PAIN.  DO NOT EXCEED A TOTAL OF 3 DOSES IN 15 MINUTES 25 tablet 8   ondansetron (ZOFRAN-ODT) 4 MG disintegrating tablet Take 1 tablet (4 mg  total) by mouth every 8 (eight) hours as needed for nausea or vomiting. 20 tablet 0   potassium chloride (KLOR-CON) 10 MEQ tablet Take 2 tablets (20 mEq total) by mouth once a week. Take every Thursday 35 tablet 3   ranolazine (RANEXA) 500 MG 12 hr tablet Take 500 mg by mouth as needed.     spironolactone (ALDACTONE) 25 MG tablet Take 1 tablet by mouth once  daily 90 tablet 2   torsemide (DEMADEX) 20 MG tablet Take 80 mg by mouth 2 (two) times daily.     levocetirizine (XYZAL) 5 MG tablet TAKE 1 TABLET BY MOUTH ONCE DAILY IN THE EVENING 30 tablet 0   No current facility-administered medications on file prior to visit.   Allergies  Allergen Reactions   Contrast Media [Iodinated Contrast Media] Hives and Other (See Comments)    Per pt strong burning sensation starting in chest radiating outward    Clonidine Derivatives Other (See Comments)    Throat dry   Statins Other (See Comments)    "bones hurt"   Sulfa Antibiotics Diarrhea    Tremors    Celebrex [Celecoxib] Rash   Isosorbide Nitrate Itching and Rash   Other Itching and Rash    Plastic and paper tape and heart monitor pads   Tape Itching and Rash    Red Where applied and will spread        Objective:    BP (!) 140/50   Pulse 76   Temp 97.9 F (36.6 C) (Oral)   Ht '5\' 3"'$  (1.6 m)   Wt 168 lb (76.2 kg)   SpO2 94%   BMI 29.76 kg/m    Physical Exam Vitals and nursing note reviewed.  Constitutional:      Appearance: Normal appearance. She is normal weight.  HENT:     Head: Normocephalic and atraumatic.  Cardiovascular:     Rate and Rhythm: Normal rate and regular rhythm.     Pulses: Normal pulses.     Heart sounds: Murmur heard.  Pulmonary:     Effort: Pulmonary effort is normal.     Breath sounds: Normal breath sounds.  Abdominal:     General: There is no distension.     Palpations: Abdomen is soft.     Tenderness: There is abdominal tenderness in the right lower quadrant. There is guarding. There is no right CVA  tenderness or left CVA tenderness.  Musculoskeletal:     Lumbar back: Tenderness and bony tenderness present.  Skin:    General: Skin is warm and dry.  Neurological:     General: No focal deficit present.     Mental Status: She is alert and oriented to person, place, and time. Mental status is at baseline.  Psychiatric:        Mood and Affect: Mood normal.        Behavior: Behavior normal.        Thought Content: Thought content normal.        Judgment: Judgment normal.     Results for orders placed or performed in visit on 11/17/22  Urinalysis, Routine w reflex microscopic  Result Value Ref Range   Color, Urine LIGHT YELLOW YELLOW   APPearance SLIGHTLY CLOUDY (A) CLEAR   Specific Gravity, Urine 1.010 1.001 - 1.035   pH 6.0 5.0 - 8.0   Glucose, UA NEGATIVE NEGATIVE   Bilirubin Urine NEGATIVE NEGATIVE   Ketones, ur NEGATIVE NEGATIVE   Hgb urine dipstick NEGATIVE NEGATIVE   Protein, ur NEGATIVE NEGATIVE   Nitrite NEGATIVE NEGATIVE   Leukocytes,Ua NEGATIVE NEGATIVE  CBC with Differential/Platelet  Result Value Ref Range   WBC 5.1 3.8 - 10.8 Thousand/uL   RBC 3.46 (L) 3.80 - 5.10 Million/uL   Hemoglobin 10.4 (L) 11.7 - 15.5 g/dL   HCT 32.0 (L) 35.0 - 45.0 %   MCV 92.5 80.0 - 100.0 fL   MCH 30.1 27.0 - 33.0 pg  MCHC 32.5 32.0 - 36.0 g/dL   RDW 12.7 11.0 - 15.0 %   Platelets 229 140 - 400 Thousand/uL   MPV 9.7 7.5 - 12.5 fL   Neutro Abs 3,259 1,500 - 7,800 cells/uL   Lymphs Abs 1,188 850 - 3,900 cells/uL   Absolute Monocytes 622 200 - 950 cells/uL   Eosinophils Absolute 20 15 - 500 cells/uL   Basophils Absolute 10 0 - 200 cells/uL   Neutrophils Relative % 63.9 %   Total Lymphocyte 23.3 %   Monocytes Relative 12.2 %   Eosinophils Relative 0.4 %   Basophils Relative 0.2 %  COMPLETE METABOLIC PANEL WITH GFR  Result Value Ref Range   Glucose, Bld 113 (H) 65 - 99 mg/dL   BUN 52 (H) 7 - 25 mg/dL   Creat 1.45 (H) 0.60 - 0.95 mg/dL   eGFR 35 (L) > OR = 60 mL/min/1.49m    BUN/Creatinine Ratio 36 (H) 6 - 22 (calc)   Sodium 120 (LL) 135 - 146 mmol/L   Potassium 4.4 3.5 - 5.3 mmol/L   Chloride 83 (L) 98 - 110 mmol/L   CO2 27 20 - 32 mmol/L   Calcium 9.2 8.6 - 10.4 mg/dL   Total Protein 6.9 6.1 - 8.1 g/dL   Albumin 4.1 3.6 - 5.1 g/dL   Globulin 2.8 1.9 - 3.7 g/dL (calc)   AG Ratio 1.5 1.0 - 2.5 (calc)   Total Bilirubin 0.6 0.2 - 1.2 mg/dL   Alkaline phosphatase (APISO) 101 37 - 153 U/L   AST 14 10 - 35 U/L   ALT 7 6 - 29 U/L        Assessment & Plan:   Problem List Items Addressed This Visit       Musculoskeletal and Integument   Closed compression fracture of L3 lumbar vertebra, initial encounter ([place] - [place]    Patient presented today with acutely worsening low back pain with bilateral sciatica pain. She did endorse some numbness around her waist in addition to the pain but symptom were somewhat vague. She is also experiencing acute RLQ abdominal pain. CT scan obtained showing acute compression fracture of L3 with 35% height loss. Will refer to neurosurgery for further evaluation.        Other   Abdominal pain    Severe RLQ tenderness to light palpation. UA negative. Urinary and bowel pattern unchanged, no nausea or vomiting. Due to her presentation I encouraged her to proceed to the ED for evaluation, she is very concerned about the wait and her back pain and does not want to go this route. We had a discussion about options and will proceed with STAT CT abdomen pelvis as well as STAT CT lumbar.      Relevant Orders   Urinalysis, Routine w reflex microscopic (Completed)   CT Abdomen Pelvis Wo Contrast   CBC with Differential/Platelet (Completed)   COMPLETE METABOLIC PANEL WITH GFR (Completed)   Other Visit Diagnoses     Acute midline low back pain with bilateral sciatica       Relevant Medications   oxyCODONE (ROXICODONE) 5 MG immediate release tablet   Other Relevant Orders   CT Lumbar Spine Wo Contrast (Completed)       Meds  ordered this encounter  Medications   oxyCODONE (ROXICODONE) 5 MG immediate release tablet    Sig: Take 1 tablet (5 mg total) by mouth every 6 (six) hours as needed for up to 5 days for severe pain.  Dispense:  20 tablet    Refill:  0    Order Specific Question:   Supervising Provider    Answer:   Jenna Luo T [id number]    Return if symptoms worsen or fail to improve.  Rubie Maid, FNP

## 2022-11-17 NOTE — Assessment & Plan Note (Signed)
Patient presented today with acutely worsening low back pain with bilateral sciatica pain. She did endorse some numbness around her waist in addition to the pain but symptom were somewhat vague. She is also experiencing acute RLQ abdominal pain. CT scan obtained showing acute compression fracture of L3 with 35% height loss. Will refer to neurosurgery for further evaluation.

## 2022-11-18 ENCOUNTER — Other Ambulatory Visit: Payer: Self-pay

## 2022-11-18 ENCOUNTER — Emergency Department (HOSPITAL_COMMUNITY): Payer: Medicare Other

## 2022-11-18 ENCOUNTER — Inpatient Hospital Stay (HOSPITAL_COMMUNITY)
Admission: EM | Admit: 2022-11-18 | Discharge: 2022-11-24 | DRG: 981 | Disposition: A | Discharge status: Skilled Nursing Facility | Payer: Medicare Other | Attending: Student | Admitting: Student

## 2022-11-18 ENCOUNTER — Observation Stay (HOSPITAL_COMMUNITY): Payer: Medicare Other

## 2022-11-18 ENCOUNTER — Encounter (HOSPITAL_COMMUNITY): Payer: Self-pay | Admitting: Emergency Medicine

## 2022-11-18 DIAGNOSIS — M8008XA Age-related osteoporosis with current pathological fracture, vertebra(e), initial encounter for fracture: Secondary | ICD-10-CM | POA: Diagnosis present

## 2022-11-18 DIAGNOSIS — M25572 Pain in left ankle and joints of left foot: Secondary | ICD-10-CM | POA: Diagnosis not present

## 2022-11-18 DIAGNOSIS — W2203XA Walked into furniture, initial encounter: Secondary | ICD-10-CM | POA: Diagnosis not present

## 2022-11-18 DIAGNOSIS — F419 Anxiety disorder, unspecified: Secondary | ICD-10-CM | POA: Diagnosis present

## 2022-11-18 DIAGNOSIS — D509 Iron deficiency anemia, unspecified: Secondary | ICD-10-CM | POA: Diagnosis present

## 2022-11-18 DIAGNOSIS — J449 Chronic obstructive pulmonary disease, unspecified: Secondary | ICD-10-CM | POA: Diagnosis not present

## 2022-11-18 DIAGNOSIS — M8580 Other specified disorders of bone density and structure, unspecified site: Secondary | ICD-10-CM | POA: Diagnosis not present

## 2022-11-18 DIAGNOSIS — Z6828 Body mass index (BMI) 28.0-28.9, adult: Secondary | ICD-10-CM | POA: Diagnosis not present

## 2022-11-18 DIAGNOSIS — N1832 Chronic kidney disease, stage 3b: Secondary | ICD-10-CM | POA: Diagnosis not present

## 2022-11-18 DIAGNOSIS — I130 Hypertensive heart and chronic kidney disease with heart failure and stage 1 through stage 4 chronic kidney disease, or unspecified chronic kidney disease: Secondary | ICD-10-CM | POA: Diagnosis present

## 2022-11-18 DIAGNOSIS — Z8249 Family history of ischemic heart disease and other diseases of the circulatory system: Secondary | ICD-10-CM

## 2022-11-18 DIAGNOSIS — Z85038 Personal history of other malignant neoplasm of large intestine: Secondary | ICD-10-CM | POA: Diagnosis not present

## 2022-11-18 DIAGNOSIS — Z7401 Bed confinement status: Secondary | ICD-10-CM | POA: Diagnosis not present

## 2022-11-18 DIAGNOSIS — Z888 Allergy status to other drugs, medicaments and biological substances status: Secondary | ICD-10-CM

## 2022-11-18 DIAGNOSIS — E871 Hypo-osmolality and hyponatremia: Secondary | ICD-10-CM | POA: Diagnosis not present

## 2022-11-18 DIAGNOSIS — E669 Obesity, unspecified: Secondary | ICD-10-CM | POA: Diagnosis present

## 2022-11-18 DIAGNOSIS — S32030A Wedge compression fracture of third lumbar vertebra, initial encounter for closed fracture: Secondary | ICD-10-CM

## 2022-11-18 DIAGNOSIS — I2510 Atherosclerotic heart disease of native coronary artery without angina pectoris: Secondary | ICD-10-CM | POA: Diagnosis not present

## 2022-11-18 DIAGNOSIS — S32010A Wedge compression fracture of first lumbar vertebra, initial encounter for closed fracture: Secondary | ICD-10-CM | POA: Diagnosis not present

## 2022-11-18 DIAGNOSIS — J439 Emphysema, unspecified: Secondary | ICD-10-CM | POA: Diagnosis not present

## 2022-11-18 DIAGNOSIS — I252 Old myocardial infarction: Secondary | ICD-10-CM

## 2022-11-18 DIAGNOSIS — Z91041 Radiographic dye allergy status: Secondary | ICD-10-CM | POA: Diagnosis not present

## 2022-11-18 DIAGNOSIS — I110 Hypertensive heart disease with heart failure: Secondary | ICD-10-CM | POA: Diagnosis not present

## 2022-11-18 DIAGNOSIS — I4819 Other persistent atrial fibrillation: Secondary | ICD-10-CM | POA: Diagnosis not present

## 2022-11-18 DIAGNOSIS — I5033 Acute on chronic diastolic (congestive) heart failure: Secondary | ICD-10-CM | POA: Diagnosis not present

## 2022-11-18 DIAGNOSIS — M4856XA Collapsed vertebra, not elsewhere classified, lumbar region, initial encounter for fracture: Secondary | ICD-10-CM | POA: Diagnosis not present

## 2022-11-18 DIAGNOSIS — E875 Hyperkalemia: Secondary | ICD-10-CM | POA: Diagnosis not present

## 2022-11-18 DIAGNOSIS — K5900 Constipation, unspecified: Secondary | ICD-10-CM | POA: Diagnosis not present

## 2022-11-18 DIAGNOSIS — M545 Low back pain, unspecified: Secondary | ICD-10-CM | POA: Insufficient documentation

## 2022-11-18 DIAGNOSIS — Z7901 Long term (current) use of anticoagulants: Secondary | ICD-10-CM | POA: Diagnosis not present

## 2022-11-18 DIAGNOSIS — Z853 Personal history of malignant neoplasm of breast: Secondary | ICD-10-CM | POA: Diagnosis not present

## 2022-11-18 DIAGNOSIS — I10 Essential (primary) hypertension: Secondary | ICD-10-CM | POA: Diagnosis not present

## 2022-11-18 DIAGNOSIS — Z6830 Body mass index (BMI) 30.0-30.9, adult: Secondary | ICD-10-CM

## 2022-11-18 DIAGNOSIS — M48061 Spinal stenosis, lumbar region without neurogenic claudication: Secondary | ICD-10-CM | POA: Diagnosis not present

## 2022-11-18 DIAGNOSIS — K5790 Diverticulosis of intestine, part unspecified, without perforation or abscess without bleeding: Secondary | ICD-10-CM | POA: Diagnosis not present

## 2022-11-18 DIAGNOSIS — R0902 Hypoxemia: Secondary | ICD-10-CM | POA: Diagnosis not present

## 2022-11-18 DIAGNOSIS — S41111A Laceration without foreign body of right upper arm, initial encounter: Secondary | ICD-10-CM | POA: Diagnosis not present

## 2022-11-18 DIAGNOSIS — D649 Anemia, unspecified: Secondary | ICD-10-CM | POA: Insufficient documentation

## 2022-11-18 DIAGNOSIS — I4821 Permanent atrial fibrillation: Secondary | ICD-10-CM | POA: Diagnosis present

## 2022-11-18 DIAGNOSIS — Z79899 Other long term (current) drug therapy: Secondary | ICD-10-CM

## 2022-11-18 DIAGNOSIS — E785 Hyperlipidemia, unspecified: Secondary | ICD-10-CM | POA: Diagnosis not present

## 2022-11-18 DIAGNOSIS — M549 Dorsalgia, unspecified: Secondary | ICD-10-CM | POA: Diagnosis not present

## 2022-11-18 DIAGNOSIS — Z882 Allergy status to sulfonamides status: Secondary | ICD-10-CM

## 2022-11-18 DIAGNOSIS — Z7189 Other specified counseling: Secondary | ICD-10-CM | POA: Diagnosis not present

## 2022-11-18 DIAGNOSIS — I272 Pulmonary hypertension, unspecified: Secondary | ICD-10-CM | POA: Diagnosis not present

## 2022-11-18 DIAGNOSIS — Z8673 Personal history of transient ischemic attack (TIA), and cerebral infarction without residual deficits: Secondary | ICD-10-CM

## 2022-11-18 DIAGNOSIS — Y92239 Unspecified place in hospital as the place of occurrence of the external cause: Secondary | ICD-10-CM | POA: Diagnosis not present

## 2022-11-18 DIAGNOSIS — S32030D Wedge compression fracture of third lumbar vertebra, subsequent encounter for fracture with routine healing: Secondary | ICD-10-CM | POA: Diagnosis not present

## 2022-11-18 DIAGNOSIS — M47816 Spondylosis without myelopathy or radiculopathy, lumbar region: Secondary | ICD-10-CM | POA: Diagnosis present

## 2022-11-18 DIAGNOSIS — R5383 Other fatigue: Secondary | ICD-10-CM | POA: Diagnosis not present

## 2022-11-18 DIAGNOSIS — I5032 Chronic diastolic (congestive) heart failure: Secondary | ICD-10-CM | POA: Diagnosis not present

## 2022-11-18 DIAGNOSIS — R0602 Shortness of breath: Secondary | ICD-10-CM | POA: Diagnosis not present

## 2022-11-18 DIAGNOSIS — Z91048 Other nonmedicinal substance allergy status: Secondary | ICD-10-CM

## 2022-11-18 DIAGNOSIS — K219 Gastro-esophageal reflux disease without esophagitis: Secondary | ICD-10-CM | POA: Diagnosis not present

## 2022-11-18 DIAGNOSIS — F064 Anxiety disorder due to known physiological condition: Secondary | ICD-10-CM

## 2022-11-18 DIAGNOSIS — M25551 Pain in right hip: Secondary | ICD-10-CM | POA: Diagnosis not present

## 2022-11-18 DIAGNOSIS — W19XXXD Unspecified fall, subsequent encounter: Secondary | ICD-10-CM | POA: Diagnosis not present

## 2022-11-18 DIAGNOSIS — Z7951 Long term (current) use of inhaled steroids: Secondary | ICD-10-CM

## 2022-11-18 DIAGNOSIS — D631 Anemia in chronic kidney disease: Secondary | ICD-10-CM | POA: Diagnosis not present

## 2022-11-18 DIAGNOSIS — U071 COVID-19: Secondary | ICD-10-CM | POA: Diagnosis not present

## 2022-11-18 DIAGNOSIS — K5792 Diverticulitis of intestine, part unspecified, without perforation or abscess without bleeding: Secondary | ICD-10-CM

## 2022-11-18 LAB — MAGNESIUM: Magnesium: 2 mg/dL (ref 1.7–2.4)

## 2022-11-18 LAB — COMPLETE METABOLIC PANEL WITH GFR
AG Ratio: 1.5 (calc) (ref 1.0–2.5)
ALT: 7 U/L (ref 6–29)
AST: 14 U/L (ref 10–35)
Albumin: 4.1 g/dL (ref 3.6–5.1)
Alkaline phosphatase (APISO): 101 U/L (ref 37–153)
BUN/Creatinine Ratio: 36 (calc) — ABNORMAL HIGH (ref 6–22)
BUN: 52 mg/dL — ABNORMAL HIGH (ref 7–25)
CO2: 27 mmol/L (ref 20–32)
Calcium: 9.2 mg/dL (ref 8.6–10.4)
Chloride: 83 mmol/L — ABNORMAL LOW (ref 98–110)
Creat: 1.45 mg/dL — ABNORMAL HIGH (ref 0.60–0.95)
Globulin: 2.8 g/dL (calc) (ref 1.9–3.7)
Glucose, Bld: 113 mg/dL — ABNORMAL HIGH (ref 65–99)
Potassium: 4.4 mmol/L (ref 3.5–5.3)
Sodium: 120 mmol/L — CL (ref 135–146)
Total Bilirubin: 0.6 mg/dL (ref 0.2–1.2)
Total Protein: 6.9 g/dL (ref 6.1–8.1)
eGFR: 35 mL/min/{1.73_m2} — ABNORMAL LOW (ref >=60)

## 2022-11-18 LAB — CBC WITH DIFFERENTIAL/PLATELET
Abs Immature Granulocytes: 0.03 10*3/uL (ref 0.00–0.07)
Absolute Monocytes: 622 cells/uL (ref 200–950)
Basophils Absolute: 10 cells/uL (ref 0–200)
Basophils Absolute: 0 10*3/uL (ref 0.0–0.1)
Basophils Relative: 0.2 %
Basophils Relative: 0 %
Eosinophils Absolute: 20 cells/uL (ref 15–500)
Eosinophils Absolute: 0 10*3/uL (ref 0.0–0.5)
Eosinophils Relative: 0.4 %
Eosinophils Relative: 1 %
HCT: 32 % — ABNORMAL LOW (ref 35.0–45.0)
HCT: 30 % — ABNORMAL LOW (ref 36.0–46.0)
Hemoglobin: 10.4 g/dL — ABNORMAL LOW (ref 11.7–15.5)
Hemoglobin: 10.2 g/dL — ABNORMAL LOW (ref 12.0–15.0)
Immature Granulocytes: 1 %
Lymphocytes Relative: 20 %
Lymphs Abs: 1188 cells/uL (ref 850–3900)
Lymphs Abs: 1.2 10*3/uL (ref 0.7–4.0)
MCH: 30.1 pg (ref 27.0–33.0)
MCH: 31.2 pg (ref 26.0–34.0)
MCHC: 32.5 g/dL (ref 32.0–36.0)
MCHC: 34 g/dL (ref 30.0–36.0)
MCV: 92.5 fL (ref 80.0–100.0)
MCV: 91.7 fL (ref 80.0–100.0)
MPV: 9.7 fL (ref 7.5–12.5)
Monocytes Absolute: 0.7 10*3/uL (ref 0.1–1.0)
Monocytes Relative: 12.2 %
Monocytes Relative: 12 %
Neutro Abs: 3259 cells/uL (ref 1500–7800)
Neutro Abs: 3.8 10*3/uL (ref 1.7–7.7)
Neutrophils Relative %: 63.9 %
Neutrophils Relative %: 66 %
Platelets: 229 10*3/uL (ref 140–400)
Platelets: 200 10*3/uL (ref 150–400)
RBC: 3.46 10*6/uL — ABNORMAL LOW (ref 3.80–5.10)
RBC: 3.27 MIL/uL — ABNORMAL LOW (ref 3.87–5.11)
RDW: 12.7 % (ref 11.0–15.0)
RDW: 13 % (ref 11.5–15.5)
Total Lymphocyte: 23.3 %
WBC: 5.1 10*3/uL (ref 3.8–10.8)
WBC: 5.7 10*3/uL (ref 4.0–10.5)
nRBC: 0 % (ref 0.0–0.2)

## 2022-11-18 LAB — RENAL FUNCTION PANEL
Albumin: 3.7 g/dL (ref 3.5–5.0)
Albumin: 3.3 g/dL — ABNORMAL LOW (ref 3.5–5.0)
Anion gap: 9 (ref 5–15)
Anion gap: 8 (ref 5–15)
BUN: 42 mg/dL — ABNORMAL HIGH (ref 8–23)
BUN: 43 mg/dL — ABNORMAL HIGH (ref 8–23)
CO2: 29 mmol/L (ref 22–32)
CO2: 29 mmol/L (ref 22–32)
Calcium: 9 mg/dL (ref 8.9–10.3)
Calcium: 8.7 mg/dL — ABNORMAL LOW (ref 8.9–10.3)
Chloride: 81 mmol/L — ABNORMAL LOW (ref 98–111)
Chloride: 83 mmol/L — ABNORMAL LOW (ref 98–111)
Creatinine, Ser: 1.29 mg/dL — ABNORMAL HIGH (ref 0.44–1.00)
Creatinine, Ser: 1.39 mg/dL — ABNORMAL HIGH (ref 0.44–1.00)
GFR, Estimated: 41 mL/min — ABNORMAL LOW (ref >60)
GFR, Estimated: 37 mL/min — ABNORMAL LOW (ref >60)
Glucose, Bld: 122 mg/dL — ABNORMAL HIGH (ref 70–99)
Glucose, Bld: 140 mg/dL — ABNORMAL HIGH (ref 70–99)
Phosphorus: 3.1 mg/dL (ref 2.5–4.6)
Phosphorus: 3.5 mg/dL (ref 2.5–4.6)
Potassium: 4.3 mmol/L (ref 3.5–5.1)
Potassium: 4.1 mmol/L (ref 3.5–5.1)
Sodium: 119 mmol/L — CL (ref 135–145)
Sodium: 120 mmol/L — ABNORMAL LOW (ref 135–145)

## 2022-11-18 LAB — BASIC METABOLIC PANEL
Anion gap: 12 (ref 5–15)
BUN: 44 mg/dL — ABNORMAL HIGH (ref 8–23)
CO2: 26 mmol/L (ref 22–32)
Calcium: 8.7 mg/dL — ABNORMAL LOW (ref 8.9–10.3)
Chloride: 83 mmol/L — ABNORMAL LOW (ref 98–111)
Creatinine, Ser: 1.21 mg/dL — ABNORMAL HIGH (ref 0.44–1.00)
GFR, Estimated: 44 mL/min — ABNORMAL LOW (ref >60)
Glucose, Bld: 106 mg/dL — ABNORMAL HIGH (ref 70–99)
Potassium: 3.8 mmol/L (ref 3.5–5.1)
Sodium: 121 mmol/L — ABNORMAL LOW (ref 135–145)

## 2022-11-18 LAB — URINALYSIS, ROUTINE W REFLEX MICROSCOPIC
Bilirubin Urine: NEGATIVE
Glucose, UA: NEGATIVE mg/dL
Hgb urine dipstick: NEGATIVE
Ketones, ur: NEGATIVE mg/dL
Leukocytes,Ua: NEGATIVE
Nitrite: NEGATIVE
Protein, ur: NEGATIVE mg/dL
Specific Gravity, Urine: 1.006 (ref 1.005–1.030)
pH: 5 (ref 5.0–8.0)

## 2022-11-18 LAB — TSH: TSH: 2.024 u[IU]/mL (ref 0.350–4.500)

## 2022-11-18 LAB — VITAMIN D 25 HYDROXY (VIT D DEFICIENCY, FRACTURES): Vit D, 25-Hydroxy: 25.11 ng/mL — ABNORMAL LOW (ref 30–100)

## 2022-11-18 LAB — BRAIN NATRIURETIC PEPTIDE: B Natriuretic Peptide: 136.1 pg/mL — ABNORMAL HIGH (ref 0.0–100.0)

## 2022-11-18 MED ORDER — CLONAZEPAM 0.5 MG PO TABS
0.5000 mg | ORAL_TABLET | Freq: Two times a day (BID) | ORAL | Status: DC | PRN
  Administered 2022-11-19 – 2022-11-23 (×5): 0.5 mg via ORAL
  Filled 2022-11-18 (×5): qty 1

## 2022-11-18 MED ORDER — MOMETASONE FURO-FORMOTEROL FUM 100-5 MCG/ACT IN AERO
2.0000 | INHALATION_SPRAY | Freq: Two times a day (BID) | RESPIRATORY_TRACT | Status: DC
  Administered 2022-11-18 – 2022-11-24 (×12): 2 via RESPIRATORY_TRACT
  Filled 2022-11-18: qty 8.8

## 2022-11-18 MED ORDER — SODIUM CHLORIDE 0.9 % IV SOLN: INTRAVENOUS | Status: DC

## 2022-11-18 MED ORDER — FUROSEMIDE 10 MG/ML IJ SOLN
80.0000 mg | Freq: Once | INTRAMUSCULAR | Status: AC
  Administered 2022-11-18: 80 mg via INTRAVENOUS
  Filled 2022-11-18: qty 8

## 2022-11-18 MED ORDER — HYDROMORPHONE HCL 1 MG/ML IJ SOLN
0.5000 mg | INTRAMUSCULAR | Status: DC | PRN
  Administered 2022-11-18 – 2022-11-23 (×9): 0.5 mg via INTRAVENOUS
  Filled 2022-11-18: qty 1
  Filled 2022-11-18 (×3): qty 0.5
  Filled 2022-11-18: qty 1
  Filled 2022-11-18 (×4): qty 0.5

## 2022-11-18 MED ORDER — APIXABAN 5 MG PO TABS
5.0000 mg | ORAL_TABLET | Freq: Two times a day (BID) | ORAL | Status: DC
  Administered 2022-11-18 – 2022-11-19 (×3): 5 mg via ORAL
  Filled 2022-11-18 (×3): qty 1

## 2022-11-18 MED ORDER — PHENOL 1.4 % MT LIQD
1.0000 | OROMUCOSAL | Status: DC | PRN
  Filled 2022-11-18: qty 177

## 2022-11-18 MED ORDER — ACETAMINOPHEN 500 MG PO TABS
1000.0000 mg | ORAL_TABLET | Freq: Three times a day (TID) | ORAL | Status: DC
  Administered 2022-11-18 – 2022-11-24 (×19): 1000 mg via ORAL
  Filled 2022-11-18 (×19): qty 2

## 2022-11-18 MED ORDER — FUROSEMIDE 10 MG/ML IJ SOLN: 80.0000 mg | Freq: Two times a day (BID) | INTRAMUSCULAR | Status: DC

## 2022-11-18 MED ORDER — IPRATROPIUM-ALBUTEROL 0.5-2.5 (3) MG/3ML IN SOLN: 3.0000 mL | Freq: Four times a day (QID) | RESPIRATORY_TRACT | Status: DC | PRN

## 2022-11-18 MED ORDER — GUAIFENESIN-DM 100-10 MG/5ML PO SYRP
5.0000 mL | ORAL_SOLUTION | ORAL | Status: DC | PRN
  Filled 2022-11-18: qty 5

## 2022-11-18 MED ORDER — CLONIDINE HCL 0.1 MG PO TABS
0.1000 mg | ORAL_TABLET | Freq: Two times a day (BID) | ORAL | Status: DC
  Administered 2022-11-18 – 2022-11-24 (×12): 0.1 mg via ORAL
  Filled 2022-11-18 (×13): qty 1

## 2022-11-18 MED ORDER — FAMOTIDINE 20 MG PO TABS
20.0000 mg | ORAL_TABLET | Freq: Every evening | ORAL | Status: DC | PRN
  Administered 2022-11-22: 20 mg via ORAL
  Filled 2022-11-18: qty 1

## 2022-11-18 MED ORDER — OXYCODONE HCL 5 MG PO TABS
5.0000 mg | ORAL_TABLET | Freq: Three times a day (TID) | ORAL | Status: DC | PRN
  Administered 2022-11-18 – 2022-11-22 (×7): 5 mg via ORAL
  Filled 2022-11-18 (×8): qty 1

## 2022-11-18 NOTE — Progress Notes (Addendum)
IR was requested for Ls KP.   Imaging reviewed with Dr. Estanislado Pandy, approved for L3 KP.  Insurance approval request will be sent today.  Appears that patient is taking Eliquis, 5 mg given today at 1237 hrs.  Eliquis requires 48 hour hold.   Labs today w/o sign of acute infection. VS stable.  Only concerning factor is patient's hx of COPD and chronic shortness of breath as patient will need to be prone for the procedure.  Will discuss with the patient once insurance approval received, possible moderate sedation VS sedation by anesthesia team.    The procedure will be scheduled next week pending insurance approval.  Asked primary team to start holding Eliquis on Saturday AM.   NIR will follow.  Please call NIR for questions and concerns.   ADDENDUM - Dr. Salvadore Dom recommends further eval with MR L spine w/o to confirm that patient is a good candidate for KP, questionable cortex disruption seen in CT L spine. MR L spine w/o ordered.    [place] PA-C 11/18/2022 2:03 PM

## 2022-11-18 NOTE — H&P (Signed)
History and Physical    Patient: [name] [id number] DOB: June 29, 1935 DOA: 11/18/2022 DOS: the patient was seen and examined on 11/18/2022 PCP: [name], MD  Patient coming from: Home.  Lives alone.  Uses cane at times.  Chief Complaint:  Chief Complaint  Patient presents with   Back Pain   HPI: [name] is a 88 y.o. female PMH of diastolic CHF, [id number], A-fib on Eliquis, COPD, CAD, left breast cancer, colon cancer, GERD and anxiety presenting with low back pain.  Patient reports some mild back pain for about 3 to 4 weeks that has acutely gotten worse over the last 2 days.  She denies fall or trauma.  Denies heavy lifting.  Describes the pain as severe with movement.  Pain is over lower back.  Denies radiation to the legs although she reports some numbness in her right leg to her knee.  Pain was 10/10 with movement.  Currently 0/10 lying in bed.  She denies bowel or bladder habit change.  She denies fever or chills.  Patient reports going to PCP office yesterday due to worsening back pain and had a CT that showed acute to subacute L3 inferior endplate compression fracture with 35% height loss but no retropulsion, multivessel lumbar spondylosis and moderate spinal canal stenosis at L4-L5.  She says she was advised to go to the hospital that she did not do until her pain was unbearable this morning when she decided to call EMS.   Reports shortness of breath and bilateral lower extremity edema unchanged from baseline.  She denies orthopnea or PND.  Reports new dry cough for the last 2 days.  She denies chest pain, nausea or vomiting.  She denies dysuria, frequency or urgency.  She reports good compliance with his diuretics.  Patient lives alone.  Denies smoking cigarette, drinking alcohol or recreational drug use.  She likes to be full code even after risk and benefit discussion about CPR and intubation.  In ED, vital stable except for slightly elevated BP. Na 121 (120 yesterday). Cr  1.21 (1.45 yesterday).  Hgb 10.2 (10.4 yesterday).  CXR with mild cardiomegaly but no vascular congestion.  Pelvic x-ray without acute finding.  BNP and UA pending.  Received IV Lasix 80 mg x 1.  Hospitalist service consulted for hyponatremia and "fluid overload".  Review of Systems: As mentioned in the history of present illness. All other systems reviewed and are negative. Past Medical History:  Diagnosis Date   Allergy    rhinitis   Aortic stenosis    mild by echo 06/2017   Arthritis    Bradycardia    a. 10/2017 -> beta blocker cut back due to HR 39.   Breast cancer ([place]) 01/06/2012   Cancer ([place])    right colon and left breast   Chronic diastolic CHF (congestive heart failure) ([place])    Colon cancer ([place]) 01/06/2012   Colovesical fistula    Dr. Marlou Starks and [name] planning surgery 08/2018- surgery revealed spontaneous closure   COPD (chronic obstructive pulmonary disease) ([place])    pt. denies   Coronary artery disease 2006   a.  NSTEMI in 2016, cath showed 15% prox-mid RCA, 20% prox LAD, EF 25-35% by cath and 35-40% -> felt due to Takotsubo cardiomyopathy.   Dilated aortic root ([place])    57mHg by echo 06/2017   Diverticulosis    Dyspnea    Edema extremities    GERD (gastroesophageal reflux disease)    Hernia  Hiatal hernia    denies   Hyperlipidemia    Hypertension    Mild aortic stenosis    echo 11/2015 but not noted on echo 06/2016   Osteopenia    Permanent atrial fibrillation (HCC)    chronic atrial fibrillation   Pneumonia    hx child   Pulmonary HTN ([place])    a. moderate to severe PASP 66mHg echo 11/2015 - now 427mg by echo 06/2017. CTA chest in 11/16 with no PE. PFTs in 7/15 with mild obstructive lung disease. She had a negative sleep study in 2017. b. Felt due to left sided HF.   [facility]   Takotsubo syndrome 07/29/2015   a. EF 35-40% by echo; akinesis of mid-apical anteroseptal and apical myocardium.  EF now normalized on echo 11/2015   Past Surgical History:   Procedure Laterality Date   APPENDECTOMY     BREAST SURGERY     lumpectomy left   CARDIAC CATHETERIZATION     CARDIAC CATHETERIZATION N/A 07/28/2015   Procedure: Left Heart Cath and Coronary Angiography;  Surgeon: Peter M JoMartiniqueMD;  [facility];  Service: Cardiovascular;  Laterality: N/A;   CHOLECYSTECTOMY     COLECTOMY     right side   CYSTOSCOPY WITH STENT PLACEMENT Left 09/04/2018   Procedure: CYSTOSCOPY WITH LEFT STENT PLACEMENT, BLADDER REPAIR, CYSTOSCOPY WITH LEFT STENT REMOVAL;  Surgeon: BoRaynelle BringMD;  Location: [facility];  Service: Urology;  Laterality: Left;   EXCISION OF ACCESSORY NIPPLE Bilateral 05/30/2013   Procedure: BILATERAL NIPPLE BIOPSY;  Surgeon: PaMerrie RoofMD;  [facility] [facility] Service: General;  Laterality: Bilateral;   EYE SURGERY Bilateral 12   cataracts   HYSTEROSCOPY WITH D & C N/A 05/29/2020   Procedure: DILATATION AND CURETTAGE /HYSTEROSCOPY;  Surgeon: ScHomero FellersMD;  Location: [facility];  Service: Gynecology;  Laterality: N/A;   IR RADIOLOGIST EVAL & MGMT  07/26/2017   LAPAROSCOPIC RIGHT COLECTOMY N/A 09/04/2018   Procedure: LAPAROSCOPIC ASSISTED SIGMOID COLECTOMY WITH REPAIR OF FISTULA TO BLADDER;  Surgeon: ToJovita KussmaulMD;  Location: [facility];  Service: General;  Laterality: N/A;   RIGHT/LEFT HEART CATH AND CORONARY ANGIOGRAPHY N/A 12/10/2020   Procedure: RIGHT/LEFT HEART CATH AND CORONARY ANGIOGRAPHY;  Surgeon: McLarey DresserMD;  [facility];  Service: Cardiovascular;  Laterality: N/A;   SPLIT NIGHT STUDY  02/02/2016       Social History:  reports that she has never smoked. She has never been exposed to tobacco smoke. She has never used smokeless tobacco. She reports that she does not drink alcohol and does not use drugs.  Allergies  Allergen Reactions   Contrast Media [Iodinated Contrast Media] Hives and Other (See Comments)    Chest pain and burning    Clonidine Derivatives Other (See  Comments)    Dry mouth, throat   Statins Other (See Comments)    Myalgias   Sulfa Antibiotics Diarrhea    Tremors    Celebrex [Celecoxib] Rash   Isordil [Isosorbide Nitrate] Itching and Rash   Tape Itching and Rash    Redness at application site Reaction is to paper and plastic tapes and EKG leads    Family History  Problem Relation Age of Onset   Heart disease Mother    Heart attack Mother    Cancer Sister        stomach and colon   Heart disease Brother [id number] Hypertension Father  Cancer Sister    Stroke Neg Hx     Prior to Admission medications   Medication Sig Start Date End Date Taking? Authorizing Provider  acetaminophen (TYLENOL) 500 MG tablet Take 1,000 mg by mouth every 6 (six) hours as needed for headache or moderate pain.   Yes [provider]  albuterol (VENTOLIN HFA) 108 (90 Base) MCG/ACT inhaler Inhale 2 puffs into the lungs every 6 (six) hours as needed for wheezing or shortness of breath. 10/27/22  Yes Nadara Mustard, [employer] S, FNP  amLODipine (NORVASC) 5 MG tablet Take 1 tablet (5 mg total) by mouth daily. 01/01/22  Yes [place], Maricela Bo, FNP  budesonide-formoterol (SYMBICORT) 80-4.5 MCG/ACT inhaler Inhale 2 puffs into the lungs in the morning and at bedtime. 11/15/22  Yes Dgayli, Berdine Addison, MD  cloNIDine (CATAPRES) 0.1 MG tablet Take 1 tablet by mouth twice daily 10/13/22  Yes Joette Catching, PA-C  famotidine (PEPCID) 20 MG tablet Take 20 mg by mouth at bedtime as needed for heartburn.   Yes [provider]  ferrous sulfate 325 (65 FE) MG tablet Take 1 tablet (325 mg total) by mouth daily with breakfast. 07/04/20  Yes [name], MD  fluticasone (FLONASE) 50 MCG/ACT nasal spray Place 1 spray into both nostrils daily as needed for allergies or rhinitis.   Yes [provider]  losartan (COZAAR) 100 MG tablet Take 1 tablet by mouth once daily 09/24/22  Yes Bensimhon, Shaune Pascal, MD  metolazone (ZAROXOLYN) 2.5 MG tablet Take 1 tablet (2.5 mg  total) by mouth once a week. Take every Thursday 11/10/22  Yes Larey Dresser, MD  ondansetron (ZOFRAN-ODT) 4 MG disintegrating tablet Take 1 tablet (4 mg total) by mouth every 8 (eight) hours as needed for nausea or vomiting. 09/01/22  Yes Isla Pence, MD  oxyCODONE (ROXICODONE) 5 MG immediate release tablet Take 1 tablet (5 mg total) by mouth every 6 (six) hours as needed for up to 5 days for severe pain. 11/17/22 11/22/22 Yes Rubie Maid, FNP  ranolazine (RANEXA) 500 MG 12 hr tablet Take 500 mg by mouth daily as needed (chest pain).   Yes [provider]  spironolactone (ALDACTONE) 25 MG tablet Take 1 tablet by mouth once daily 09/10/22  Yes Turner, Traci R, MD  torsemide (DEMADEX) 20 MG tablet Take 80 mg by mouth 2 (two) times daily.   Yes [provider]  apixaban (ELIQUIS) 5 MG TABS tablet Take 1 tablet (5 mg total) by mouth 2 (two) times daily. 11/10/22   Larey Dresser, MD  clonazePAM Bobbye [place]) 0.5 MG tablet Take 1 tablet by mouth twice daily as needed for anxiety 10/08/22   [name], MD  levocetirizine (XYZAL) 5 MG tablet TAKE 1 TABLET BY MOUTH ONCE DAILY IN THE EVENING Patient not taking: Reported on 11/18/2022 11/18/22   [name], MD  nitroGLYCERIN (NITROSTAT) 0.4 MG SL tablet DISSOLVE ONE TABLET UNDER THE TONGUE EVERY 5 MINUTES AS NEEDED FOR CHEST PAIN.  DO NOT EXCEED A TOTAL OF 3 DOSES IN 15 MINUTES 11/06/21   Turner, Eber Hong, MD  potassium chloride (KLOR-CON) 10 MEQ tablet Take 2 tablets (20 mEq total) by mouth once a week. Take every Thursday 11/10/22   Larey Dresser, MD    Physical Exam: Vitals:   11/18/22 0601 11/18/22 0932  BP: (!) 163/46 (!) 167/74  Pulse: 62 68  Resp: 16 (!) 22  Temp: 98.2 F (36.8 C) 98.5 F (36.9 C)  TempSrc:  Oral  SpO2: 94%    GENERAL: No apparent distress.  Nontoxic. HEENT: MMM.  Vision and hearing grossly intact.  NECK: Supple.  No apparent JVD.  RESP:  No IWOB.  Fair aeration bilaterally. CVS:  RRR. Heart  sounds normal.  ABD/GI/GU: BS+. Abd soft, NTND.  MSK/EXT:   Some BLE weakness but more on the right, partly due to pain in back.  Trace BLE edema. SKIN: BLE skin changes from chronic venous insufficiency. NEURO: Awake and alert. Oriented appropriately.  No apparent focal neuro deficit.  Patellar reflex symmetric. PSYCH: Calm. Normal affect.  Data Reviewed: See HPI  Assessment and Plan: Principal Problem:   Hyponatremia Active Problems:   Persistent atrial fibrillation (HCC)   Hypertension   COPD (chronic obstructive pulmonary disease) (HCC)   Coronary artery disease   Chronic diastolic CHF (congestive heart failure) (HCC)   Chronic kidney disease, stage 3b (HCC)   Closed compression fracture of L3 lumbar vertebra, initial encounter (HCC)   Lower back pain   Normocytic anemia   Hyponatremia: Na 121 (120 yesterday.  Baseline in low 130's).  Patient is on Aldactone and losartan which could contribute.  Seems like she was started on metolazone recently.  She has trace BLE edema which seems to be chronic.  Received IV Lasix 80 mg in ED. -Check BMP every 8 hours -Hold metolazone, Aldactone and losartan for now -Doubt utility of checking urine sodium while on diuretics -Check TSH  Acute lower back pain due to acute close L3 compression fracture: No reported trauma, fall or heavy lifting. CT showed L3 compression fracture with 35% height loss but no retropulsion. -Pain control with scheduled Tylenol, as needed oxy and Dilaudid -IR consulted for possible kyphoplasty -PT/OT  Chronic diastolic CHF: Patient has chronic dyspnea and BLE edema unchanged from baseline.  However, she reports new dry cough over the last 2 days.  No orthopnea or PND.  Overall, she appears euvolemic on exam.  CXR without vascular congestion or edema.  BNP only 136.  TTE in 03/2022 with LVEF of 60 to 65%, RVSP of 50 mmHg.  She seems to be on torsemide 80 mg twice daily and Aldactone 25 mg daily.  Seems like metolazone  was added to be taken every Thursday. -Received IV Lasix 80 mg x 1 in ED.  Will continue IV Lasix 80 mg twice daily -Hold home torsemide, Aldactone and metolazone. -Strict intake and output, daily weight, renal functions and electrolytes  Chronic COPD: Stable. Ruthe Mannan instead of home Symbicort -DuoNebs as needed  History of CAD: No chest pain.  Seems to take Ranexa as needed at home -Holding losartan as above.  Persistent atrial fibrillation: Rate controlled without medications.  On low-dose Eliquis for anticoagulation -Full dose Eliquis for anticoagulation now since she does not meet criteria for low-dose  Essential hypertension: BP slightly elevated. -Resume home clonidine -IV Lasix as above -Hold home torsemide, Aldactone and losartan.  CKD-3B: Cr seems to be better than baseline. Recent Labs    08/31/22 1508 09/01/22 1214 09/10/22 1220 09/20/22 1547 09/30/22 1635 10/15/22 1216 10/29/22 1244 11/10/22 1459 11/17/22 1151 11/18/22 0635  BUN 94* 95* 61* 61* 63* 71* 30* 42* 52* 44*  CREATININE 1.69* 1.94* 1.68* 1.18* 1.41* 1.59* 1.07* 1.44* 1.45* 1.21*  -Continue monitoring  Normocytic anemia: At baseline. Recent Labs    07/15/22 1218 08/20/22 1159 08/31/22 1508 09/01/22 1214 09/10/22 1220 09/20/22 1547 09/28/22 0902 10/29/22 1244 11/17/22 1151 11/18/22 0635  HGB 10.7* 11.1* 11.3* 11.2* 10.3* 10.3* 10.7* 10.3* 10.4*  10.2*  -Check anemia panel  Goal of care counseling/ACP: Discussed CODE STATUS including pros and cons of CPR or intubation.  Given her comorbidities, I feel CPR and intubation would pose more harm than benefit but patient prefers to remain full code.     Advance Care Planning:   Code Status: Full Code   Consults: None  Family Communication: Updated patient's son at patient's request.  Severity of Illness: The appropriate patient status for this patient is OBSERVATION. Observation status is judged to be reasonable and necessary in order to  provide the required intensity of service to ensure the patient's safety. The patient's presenting symptoms, physical exam findings, and initial radiographic and laboratory data in the context of their medical condition is felt to place them at decreased risk for further clinical deterioration. Furthermore, it is anticipated that the patient will be medically stable for discharge from the hospital within 2 midnights of admission.   Author: Mercy Riding, MD 11/18/2022 1:26 PM  For on call review [email or web address].

## 2022-11-18 NOTE — ED Notes (Signed)
Called lab and ordered BNP

## 2022-11-18 NOTE — ED Notes (Signed)
ED TO INPATIENT HANDOFF REPORT  ED Nurse Name and Phone #: [phone]   S Name/Age/Gender [name] 87 y.o. female Room/Bed: 041C/041C  Code Status   Code Status: Full Code  Home/SNF/Other Home Patient oriented to: self, place, time, and situation Is this baseline? Yes   Triage Complete: Triage complete  Chief Complaint Hyponatremia [E87.1]  Triage Note Pt BIB EMS from home with c/o lower back pain. Pt was seen at ortho yesterday and was told she had a L3 fx and to come to the hospital. Pt states that she did not want to come yesterday and the pain has gotten worse.   100 fentanyl given with EMS  20 RAC  166/80  60 afib on the monitor with EMS   Allergies Allergies  Allergen Reactions   Contrast Media [Iodinated Contrast Media] Hives and Other (See Comments)    Chest pain and burning    Clonidine Derivatives Other (See Comments)    Dry mouth, throat   Statins Other (See Comments)    Myalgias   Sulfa Antibiotics Diarrhea    Tremors    Celebrex [Celecoxib] Rash   Isordil [Isosorbide Nitrate] Itching and Rash   Tape Itching and Rash    Redness at application site Reaction is to paper and plastic tapes and EKG leads    Level of Care/Admitting Diagnosis ED Disposition     ED Disposition  Admit   Condition  --   [place]: [place] [[id number]]  Level of Care: Telemetry Cardiac [103]  [place] patient in observation at [facility] or [place] if equivalent level of care is available:: No  Covid Evaluation: Asymptomatic - no recent exposure (last 10 days) testing not required  Diagnosis: Hyponatremia [198519]  Admitting Physician: Mercy Riding [id number]  Attending Physician: Mercy Riding [id number]          B Medical/Surgery History Past Medical History:  Diagnosis Date   Allergy    rhinitis   Aortic stenosis    mild by echo 06/2017   Arthritis    Bradycardia    a. 10/2017 -> beta blocker cut back due to HR  39.   Breast cancer ([place]) 01/06/2012   Cancer ([place])    right colon and left breast   Chronic diastolic CHF (congestive heart failure) ([place])    Colon cancer ([place]) 01/06/2012   Colovesical fistula    Dr. Marlou Starks and [name] planning surgery 08/2018- surgery revealed spontaneous closure   COPD (chronic obstructive pulmonary disease) ([place])    pt. denies   Coronary artery disease 2006   a.  NSTEMI in 2016, cath showed 15% prox-mid RCA, 20% prox LAD, EF 25-35% by cath and 35-40% -> felt due to Takotsubo cardiomyopathy.   Dilated aortic root ([place])    61mHg by echo 06/2017   Diverticulosis    Dyspnea    Edema extremities    GERD (gastroesophageal reflux disease)    Hernia    Hiatal hernia    denies   Hyperlipidemia    Hypertension    Mild aortic stenosis    echo 11/2015 but not noted on echo 06/2016   Osteopenia    Permanent atrial fibrillation (HCC)    chronic atrial fibrillation   Pneumonia    hx child   Pulmonary HTN ([place]    a. moderate to severe PASP 671mg echo 11/2015 - now 4175m by echo 06/2017. CTA chest in 11/16 with no PE. PFTs in  7/15 with mild obstructive lung disease. She had a negative sleep study in 2017. b. Felt due to left sided HF.   Stroke [facility])    Takotsubo syndrome 07/29/2015   a. EF 35-40% by echo; akinesis of mid-apical anteroseptal and apical myocardium.  EF now normalized on echo 11/2015   Past Surgical History:  Procedure Laterality Date   APPENDECTOMY     BREAST SURGERY     lumpectomy left   CARDIAC CATHETERIZATION     CARDIAC CATHETERIZATION N/A 07/28/2015   Procedure: Left Heart Cath and Coronary Angiography;  Surgeon: Peter M Martinique, MD;  Location: [facility];  Service: Cardiovascular;  Laterality: N/A;   CHOLECYSTECTOMY     COLECTOMY     right side   CYSTOSCOPY WITH STENT PLACEMENT Left 09/04/2018   Procedure: CYSTOSCOPY WITH LEFT STENT PLACEMENT, BLADDER REPAIR, CYSTOSCOPY WITH LEFT STENT REMOVAL;  Surgeon: Raynelle Bring, MD;  Location: [facility];  Service: Urology;  Laterality: Left;   EXCISION OF ACCESSORY NIPPLE Bilateral 05/30/2013   Procedure: BILATERAL NIPPLE BIOPSY;  Surgeon: Merrie Roof, MD;  Location: [facility];  Service: General;  Laterality: Bilateral;   EYE SURGERY Bilateral 12   cataracts   HYSTEROSCOPY WITH D & C N/A 05/29/2020   Procedure: DILATATION AND CURETTAGE /HYSTEROSCOPY;  Surgeon: Homero Fellers, MD;  Location: [facility] ORS;  Service: Gynecology;  Laterality: N/A;   IR RADIOLOGIST EVAL & MGMT  07/26/2017   LAPAROSCOPIC RIGHT COLECTOMY N/A 09/04/2018   Procedure: LAPAROSCOPIC ASSISTED SIGMOID COLECTOMY WITH REPAIR OF FISTULA TO BLADDER;  Surgeon: Jovita Kussmaul, MD;  Location: [facility];  Service: General;  Laterality: N/A;   RIGHT/LEFT HEART CATH AND CORONARY ANGIOGRAPHY N/A 12/10/2020   Procedure: RIGHT/LEFT HEART CATH AND CORONARY ANGIOGRAPHY;  Surgeon: Larey Dresser, MD;  Location: [facility];  Service: Cardiovascular;  Laterality: N/A;   SPLIT NIGHT STUDY  02/02/2016         A IV Location/Drains/Wounds Patient Lines/Drains/Airways Status     Active Line/Drains/Airways     Name Placement date Placement time Site Days   Peripheral IV 10/29/22 20 G Anterior;Right Forearm 10/29/22  1455  Forearm  20   Wound / Incision (Open or Dehisced) 03/15/22 Skin tear Arm Left;Lower;Posterior 03/15/22  2228  Arm  248            Intake/Output Last 24 hours No intake or output data in the 24 hours ending 11/18/22 1742  Labs/Imaging Results for orders placed or performed during the hospital encounter of 11/18/22 (from the past 48 hour(s))  CBC with Differential     Status: Abnormal   Collection Time: 11/18/22  6:35 AM  Result Value Ref Range   WBC 5.7 4.0 - 10.5 K/uL   RBC 3.27 (L) 3.87 - 5.11 MIL/uL   Hemoglobin 10.2 (L) 12.0 - 15.0 g/dL   HCT 30.0 (L) 36.0 - 46.0 %   MCV 91.7 80.0 - 100.0 fL   MCH 31.2 26.0 - 34.0 pg   MCHC 34.0 30.0 - 36.0 g/dL   RDW 13.0 11.5 - 15.5 %   Platelets 200 150 -  400 K/uL   nRBC 0.0 0.0 - 0.2 %   Neutrophils Relative % 66 %   Neutro Abs 3.8 1.7 - 7.7 K/uL   Lymphocytes Relative 20 %   Lymphs Abs 1.2 0.7 - 4.0 K/uL   Monocytes Relative 12 %   Monocytes Absolute 0.7 0.1 - 1.0 K/uL   Eosinophils Relative 1 %  Eosinophils Absolute 0.0 0.0 - 0.5 K/uL   Basophils Relative 0 %   Basophils Absolute 0.0 0.0 - 0.1 K/uL   Immature Granulocytes 1 %   Abs Immature Granulocytes 0.03 0.00 - 0.07 K/uL    Comment: Performed at [facility], [place] [address], [place], [place] [id number]  Basic metabolic panel     Status: Abnormal   Collection Time: 11/18/22  6:35 AM  Result Value Ref Range   Sodium 121 (L) 135 - 145 mmol/L   Potassium 3.8 3.5 - 5.1 mmol/L   Chloride 83 (L) 98 - 111 mmol/L   CO2 26 22 - 32 mmol/L   Glucose, Bld 106 (H) 70 - 99 mg/dL    Comment: Glucose reference range applies only to samples taken after fasting for at least 8 hours.   BUN 44 (H) 8 - 23 mg/dL   Creatinine, Ser 1.21 (H) 0.44 - 1.00 mg/dL   Calcium 8.7 (L) 8.9 - 10.3 mg/dL   GFR, Estimated 44 (L) >60 mL/min    Comment: (NOTE) Calculated using the CKD-EPI Creatinine Equation (2021)    Anion gap 12 5 - 15    Comment: Performed at [facility] [address], [place], [place] [address]  Brain natriuretic peptide     Status: Abnormal   Collection Time: 11/18/22  6:35 AM  Result Value Ref Range   B Natriuretic Peptide 136.1 (H) 0.0 - 100.0 pg/mL    Comment: Performed at [facility] [address] [place], [place] [address]  Urinalysis, Routine w reflex microscopic -Urine, Clean Catch     Status: Abnormal   Collection Time: 11/18/22 10:06 AM  Result Value Ref Range   Color, Urine STRAW (A) YELLOW   APPearance CLEAR CLEAR   Specific Gravity, Urine 1.006 1.005 - 1.030   pH 5.0 5.0 - 8.0   Glucose, UA NEGATIVE NEGATIVE mg/dL   Hgb urine dipstick NEGATIVE NEGATIVE   Bilirubin Urine NEGATIVE NEGATIVE   Ketones, ur NEGATIVE NEGATIVE mg/dL   Protein, ur  NEGATIVE NEGATIVE mg/dL   Nitrite NEGATIVE NEGATIVE   Leukocytes,Ua NEGATIVE NEGATIVE    Comment: Performed at [facility] [address] [place], [place] [address]  Renal function panel     Status: Abnormal   Collection Time: 11/18/22  1:50 PM  Result Value Ref Range   Sodium 119 (LL) 135 - 145 mmol/L    Comment: CRITICAL RESULT CALLED TO, READ BACK BY AND VERIFIED WITH C ROWE RN 11/18/2022 1622 BNUNNERY   Potassium 4.3 3.5 - 5.1 mmol/L   Chloride 81 (L) 98 - 111 mmol/L   CO2 29 22 - 32 mmol/L   Glucose, Bld 122 (H) 70 - 99 mg/dL    Comment: Glucose reference range applies only to samples taken after fasting for at least 8 hours.   BUN 42 (H) 8 - 23 mg/dL   Creatinine, Ser 1.29 (H) 0.44 - 1.00 mg/dL   Calcium 9.0 8.9 - 10.3 mg/dL   Phosphorus 3.1 2.5 - 4.6 mg/dL   Albumin 3.7 3.5 - 5.0 g/dL   GFR, Estimated 41 (L) >60 mL/min    Comment: (NOTE) Calculated using the CKD-EPI Creatinine Equation (2021)    Anion gap 9 5 - 15    Comment: Performed at [facility] [address] [place], [place] [address]  VITAMIN D 25 Hydroxy (Vit-D Deficiency, Fractures)     Status: Abnormal   Collection Time: 11/18/22  1:50 PM  Result Value Ref Range   Vit D, 25-Hydroxy 25.11 (L) 30 - 100 ng/mL    Comment: (NOTE) Vitamin D deficiency has been defined by the [place] practice guideline as a level of serum 25-OH  vitamin D less than 20 ng/mL (1,2). The Endocrine Society went on to  further define vitamin D insufficiency as a level between 21 and 29  ng/mL (2).  1. IOM (Institute of Medicine). 2010. Dietary reference intakes for  calcium and D. [place]: The [employer]. 2. Holick MF, Binkley Helper, Bischoff-Ferrari HA, et al. Evaluation,  treatment, and prevention of vitamin D deficiency: an Endocrine  Society clinical practice guideline, JCEM. 2011 Jul; 96(7): 1911-30.  Performed at [facility], [place] [address]  [place], [place] [address]   Magnesium     Status: None   Collection Time: 11/18/22  1:50 PM  Result Value Ref Range   Magnesium 2.0 1.7 - 2.4 mg/dL    Comment: Performed at [facility] [address], [place], [place] [address]  TSH     Status: None   Collection Time: 11/18/22  1:50 PM  Result Value Ref Range   TSH 2.024 0.350 - 4.500 uIU/mL    Comment: Performed by a 3rd Generation assay with a functional sensitivity of <=0.01 uIU/mL. Performed at [facility], [place] [address], [place],  [address]    *Note: Due to a large number of results and/or encounters for the requested time period, some results have not been displayed. A complete set of results can be found in Results Review.   MR LUMBAR SPINE WO CONTRAST  Result Date: 11/18/2022 CLINICAL DATA:  Back pain.  Lumbar compression fracture. EXAM: MRI LUMBAR SPINE WITHOUT CONTRAST TECHNIQUE: Multiplanar, multisequence MR imaging of the lumbar spine was performed. No intravenous contrast was administered. COMPARISON:  CT lumbar spine 11/17/2022. Abdominopelvic CT 09/01/2022 and 07/16/2018. FINDINGS: Segmentation: Conventional anatomy assumed, with the last open disc space designated L5-S1.Concordant with prior imaging. Alignment: Stable chronic retrolisthesis and mild focal kyphosis at L1-2. Vertebrae: As seen on recent lumbar spine CT, there is a subacute inferior endplate compression fracture at L3 resulting in approximately 20% loss of vertebral body height and no osseous retropulsion. There is mild marrow edema inferiorly in the vertebral body. There is also a mild inferior endplate compression fracture at L4 which results in approximately 10% loss of vertebral body height. There is also associated with mild marrow edema. Chronic unchanged inferior endplate compression deformity and Schmorl's node at L1. No suspicious osseous lesions. Conus medullaris: Extends to the T12-L1 level and appears normal. Paraspinal and other soft  tissues: No significant paraspinal findings. Disc levels: L1-2: Chronic degenerative disc disease with loss of disc height, annular disc bulging and endplate osteophytes asymmetric to the right. Chronic mild right lateral recess and right foraminal narrowing without nerve root encroachment. L2-3: Chronic spondylosis with loss of disc height, disc bulging and endplate osteophytes asymmetric to the left. Mild narrowing of the left lateral recess and left foramen without nerve root encroachment. L3-4: Chronic loss of disc height with annular disc bulging, endplate osteophytes, facet and ligamentous hypertrophy. Resulting mild multifactorial spinal stenosis with mild lateral recess and foraminal narrowing bilaterally. L4-5: Disc height and hydration are maintained. There is mild disc bulging and moderate facet/ligamentous hypertrophy. Resulting moderate multifactorial spinal stenosis with mild lateral recess and foraminal narrowing bilaterally. L5-S1: Chronic loss of disc height with annular disc bulging and  circumferential endplate osteophytes. Mild bilateral facet hypertrophy. Chronic mild right-greater-than-left foraminal narrowing. The spinal canal and lateral recesses are patent. IMPRESSION: 1. Subacute inferior endplate compression fractures at L3 and L4 with mild associated marrow edema. No osseous retropulsion. 2. Stable chronic inferior endplate compression deformity at L1. 3. Multilevel spondylosis with resulting mild multifactorial spinal stenosis at L3-4 and moderate spinal stenosis at L4-5. No high-grade foraminal narrowing or definite nerve root encroachment. Electronically Signed   By: Richardean Sale M.D.   On: 11/18/2022 16:57   DG Chest 1 View  Result Date: 11/18/2022 CLINICAL DATA:  Shortness of breath. EXAM: CHEST  1 VIEW COMPARISON:  10/29/2022 FINDINGS: The heart is mildly enlarged but stable. Stable tortuosity and calcification of the thoracic aorta. Mild chronic bronchitic type lung changes  but no infiltrates, edema or effusions. No pulmonary lesions or pneumothorax. The bony thorax is intact. IMPRESSION: Mild cardiac enlargement and chronic lung changes but no acute pulmonary findings. Electronically Signed   By: Marijo Sanes M.D.   On: 11/18/2022 08:59   DG Hip Unilat With Pelvis 2-3 Views Right  Result Date: 11/18/2022 CLINICAL DATA:  Hip pain.  Known L3 compression fracture. EXAM: DG HIP (WITH OR WITHOUT PELVIS) 3V RIGHT COMPARISON:  Lumbar CT from yesterday FINDINGS: Generalized osteopenia. Both hips are located. No evidence of fracture or diastasis. IMPRESSION: No acute finding. Electronically Signed   By: Jorje Guild M.D.   On: 11/18/2022 06:53   CT Lumbar Spine Wo Contrast  Result Date: 11/17/2022 CLINICAL DATA:  Mid to low back and bilateral sacroiliac joint pain. EXAM: CT LUMBAR SPINE WITHOUT CONTRAST TECHNIQUE: Multidetector CT imaging of the lumbar spine was performed without intravenous contrast administration. Multiplanar CT image reconstructions were also generated. RADIATION DOSE REDUCTION: This exam was performed according to the departmental dose-optimization program which includes automated exposure control, adjustment of the mA and/or kV according to patient size and/or use of iterative reconstruction technique. COMPARISON:  CT abdomen pelvis dated September 01, 2022. FINDINGS: Segmentation: 5 lumbar type vertebrae. Alignment: Unchanged focal kyphosis at L1 with mild retrolisthesis at L1-L2. Vertebrae: New acute to subacute L3 inferior endplate compression fracture with up to 35% height loss. No retropulsion. Chronic L1 inferior endplate compression deformity. Osteopenia. Paraspinal and other soft tissues: Aortoiliac atherosclerotic vascular disease. Old granulomatous disease in the spleen. Disc levels: T12-L1:  Mild disc bulging.  No stenosis. L1-L2:  Mild retropulsion endplate spurring.  No stenosis. L2-L3: Minimal disc bulging and posterior endplate spurring. No  stenosis. L3-L4: Mild disc bulging. Mild spinal canal and left neuroforaminal stenosis. No right neuroforaminal stenosis. L4-L5: Mild disc bulging and bilateral facet arthropathy. Prominent ligamentum flavum hypertrophy. Moderate spinal canal and mild right neuroforaminal stenosis. No left neuroforaminal stenosis. L5-S1: Severe disc height loss with small circumferential disc osteophyte complex and mild bilateral facet arthropathy. No stenosis. IMPRESSION: 1. New acute to subacute L3 inferior endplate compression fracture with up to 35% height loss. No retropulsion. 2. Chronic L1 inferior endplate compression deformity. 3. Multilevel lumbar spondylosis as described above. Moderate spinal canal stenosis at L4-L5. 4.  Aortic Atherosclerosis (ICD10-I70.0). Electronically Signed   By: Titus Dubin M.D.   On: 11/17/2022 15:14    Pending Labs Unresulted Labs (From admission, onward)     Start     Ordered   11/19/22 0500  Vitamin B12  (Anemia Panel (PNL))  Tomorrow morning,   R        11/18/22 1340   11/19/22 0500  Folate  (Anemia Panel (PNL))  Tomorrow morning,   R        11/18/22 1340   11/19/22 0500  Iron and TIBC  (Anemia Panel (PNL))  Tomorrow morning,   R        11/18/22 1340   11/19/22 0500  Ferritin  (Anemia Panel (PNL))  Tomorrow morning,   R        11/18/22 1340   11/19/22 0500  Reticulocytes  (Anemia Panel (PNL))  Tomorrow morning,   R        11/18/22 1340   11/19/22 0500  CBC  Tomorrow morning,   R        11/18/22 1340   11/19/22 0500  Magnesium  Tomorrow morning,   R        11/18/22 1340   11/18/22 1136  Renal function panel  Now then every 8 hours,   R (with TIMED occurrences)      11/18/22 1135            Vitals/Pain Today's Vitals   11/18/22 1515 11/18/22 1530 11/18/22 1545 11/18/22 1738  BP:  (!) 146/54  120/60  Pulse: (!) 54 (!) 56 (!) 52 (!) 53  Resp: (!) 22 (!) '25 19 14  '$ Temp:    98.2 F (36.8 C)  TempSrc:    Oral  SpO2: 93% 94% 93% 93%  PainSc:         Isolation Precautions No active isolations  Medications Medications  acetaminophen (TYLENOL) tablet 1,000 mg (1,000 mg Oral Given 11/18/22 1357)  oxyCODONE (Oxy IR/ROXICODONE) immediate release tablet 5 mg (5 mg Oral Given 11/18/22 1357)  HYDROmorphone (DILAUDID) injection 0.5 mg (0.5 mg Intravenous Given 11/18/22 1124)  famotidine (PEPCID) tablet 20 mg (has no administration in time range)  apixaban (ELIQUIS) tablet 5 mg (5 mg Oral Given 11/18/22 1237)  mometasone-formoterol (DULERA) 100-5 MCG/ACT inhaler 2 puff (2 puffs Inhalation Given 11/18/22 1357)  ipratropium-albuterol (DUONEB) 0.5-2.5 (3) MG/3ML nebulizer solution 3 mL (has no administration in time range)  cloNIDine (CATAPRES) tablet 0.1 mg (0.1 mg Oral Given 11/18/22 1357)  clonazePAM (KLONOPIN) tablet 0.5 mg (has no administration in time range)  0.9 %  sodium chloride infusion ( Intravenous New Bag/Given 11/18/22 1705)  furosemide (LASIX) injection 80 mg (80 mg Intravenous Given 11/18/22 0947)    Mobility walks with person assist      Focused Assessments Cardiac Assessment Handoff:    Lab Results  Component Value Date   TROPONINI 0.07 ([place]) 09/07/2018   Lab Results  Component Value Date   DDIMER 3.92 ([facility]) 09/05/2018   Does the Patient currently have chest pain? No    R Recommendations: See Admitting Provider Note  Report given to:   Additional Notes: Patient alert & Oriented X 4,  MRI L-Spine done.  IVF - NS @ 65

## 2022-11-18 NOTE — ED Notes (Signed)
Patient returned from MRI.

## 2022-11-18 NOTE — Plan of Care (Signed)
Sodium trended down to 119 after IV Lasix.  Creatinine slightly up to 1.3.  Stop IV Lasix.  Will try IV normal saline at 65 cc an hour.  Continue monitoring sodium and respiratory status.

## 2022-11-18 NOTE — Assessment & Plan Note (Signed)
Severe RLQ tenderness to light palpation. UA negative. Urinary and bowel pattern unchanged, no nausea or vomiting. Due to her presentation I encouraged her to proceed to the ED for evaluation, she is very concerned about the wait and her back pain and does not want to go this route. We had a discussion about options and will proceed with STAT CT abdomen pelvis as well as STAT CT lumbar.

## 2022-11-18 NOTE — ED Notes (Signed)
Sodium of 119 reported to Dr Cyndia Skeeters.

## 2022-11-18 NOTE — ED Notes (Signed)
Transported to MRI

## 2022-11-18 NOTE — ED Notes (Signed)
Pt moved to 41

## 2022-11-18 NOTE — ED Provider Notes (Addendum)
[facility] Provider Note   CSN: [id number] Arrival date & time: 11/18/22  [id number]     History Chief Complaint  Patient presents with   Back Pain    [name] is a 88 y.o. female with PMH GERD, permanent HTN, A-fib, CAD, pulmonary hypertension, HFpEF with prior episode of Takotsubo cardiomyopathy, COPD coming to the ED for acute onset back pain.  She states that she saw her primary doctor yesterday after suddenly worsening back pain and had a CT of her L-spine that showed an L3 compression fracture.  She denies any falls or other trauma preceding this.  She was given appropriate pain management and referred to neurosurgery for evaluation, but her pain was severe so she came into the ED.  She also notes were swelling in her legs, some orthopnea and PND.  She follows with Dr. Aundra Dubin for her HFpEF and other cardiac issues and she was recently placed on metolazone 2.5 weekly in addition to torsemide 80 twice daily, spironolactone 25.  She denies any confusion, seizure-like activity, significant dyspnea at rest, chest pain.  She is not on oxygen at home.   Home Medications Prior to Admission medications   Medication Sig Start Date End Date Taking? Authorizing Provider  acetaminophen (TYLENOL) 500 MG tablet Take 1,000 mg by mouth every 6 (six) hours as needed for headache or moderate pain.    [provider]  albuterol (VENTOLIN HFA) 108 (90 Base) MCG/ACT inhaler Inhale 2 puffs into the lungs every 6 (six) hours as needed for wheezing or shortness of breath. 10/27/22   Rubie Maid, FNP  amLODipine (NORVASC) 5 MG tablet Take 1 tablet (5 mg total) by mouth daily. 01/01/22   [place], Maricela Bo, FNP  apixaban (ELIQUIS) 5 MG TABS tablet Take 1 tablet (5 mg total) by mouth 2 (two) times daily. 11/10/22   Larey Dresser, MD  Azelastine HCl 137 MCG/SPRAY SOLN Place 1 spray into both nostrils 2 (two) times daily as needed (congestion). 06/10/21    [provider]  budesonide-formoterol (SYMBICORT) 80-4.5 MCG/ACT inhaler Inhale 2 puffs into the lungs in the morning and at bedtime. 11/15/22   Armando Reichert, MD  Cholecalciferol (VITAMIN D3) 25 MCG (1000 UT) CAPS Take 1,000 Units by mouth once a week.    [provider]  clonazePAM (KLONOPIN) 0.5 MG tablet Take 1 tablet by mouth twice daily as needed for anxiety 10/08/22   [name], MD  cloNIDine (CATAPRES) 0.1 MG tablet Take 1 tablet by mouth twice daily 10/13/22   Joette Catching, PA-C  famotidine (PEPCID) 20 MG tablet Take 20 mg by mouth at bedtime as needed for heartburn.    [provider]  ferrous sulfate 325 (65 FE) MG tablet Take 1 tablet (325 mg total) by mouth daily with breakfast. 07/04/20   [name], MD  fluticasone (FLONASE) 50 MCG/ACT nasal spray Place 1 spray into both nostrils daily as needed for allergies or rhinitis.    [provider]  levocetirizine (XYZAL) 5 MG tablet TAKE 1 TABLET BY MOUTH ONCE DAILY IN THE EVENING 11/18/22   [name], MD  losartan (COZAAR) 100 MG tablet Take 1 tablet by mouth once daily 09/24/22   Bensimhon, Shaune Pascal, MD  metolazone (ZAROXOLYN) 2.5 MG tablet Take 1 tablet (2.5 mg total) by mouth once a week. Take every Thursday 11/10/22   Larey Dresser, MD  nitroGLYCERIN (NITROSTAT) 0.4 MG SL tablet DISSOLVE ONE  TABLET UNDER THE TONGUE EVERY 5 MINUTES AS NEEDED FOR CHEST PAIN.  DO NOT EXCEED A TOTAL OF 3 DOSES IN 15 MINUTES 11/06/21   Turner, Eber Hong, MD  ondansetron (ZOFRAN-ODT) 4 MG disintegrating tablet Take 1 tablet (4 mg total) by mouth every 8 (eight) hours as needed for nausea or vomiting. 09/01/22   Isla Pence, MD  oxyCODONE (ROXICODONE) 5 MG immediate release tablet Take 1 tablet (5 mg total) by mouth every 6 (six) hours as needed for up to 5 days for severe pain. 11/17/22 11/22/22  Rubie Maid, FNP  potassium chloride (KLOR-CON) 10 MEQ tablet Take 2 tablets (20 mEq total) by mouth  once a week. Take every Thursday 11/10/22   Larey Dresser, MD  ranolazine (RANEXA) 500 MG 12 hr tablet Take 500 mg by mouth as needed.    [provider]  spironolactone (ALDACTONE) 25 MG tablet Take 1 tablet by mouth once daily 09/10/22   [name], MD  torsemide (DEMADEX) 20 MG tablet Take 80 mg by mouth 2 (two) times daily.    [provider]      Allergies    Contrast media [iodinated contrast media], Clonidine derivatives, Statins, Sulfa antibiotics, Celebrex [celecoxib], Isosorbide nitrate, Other, and Tape    Review of Systems     Physical Exam Updated Vital Signs BP (!) 163/46 (BP Location: Right Arm)   Pulse 62   Temp 98.2 F (36.8 C)   Resp 16   SpO2 94%  Physical Exam Constitutional:      General: She is not in acute distress. HENT:     Head: Normocephalic and atraumatic.  Cardiovascular:     Rate and Rhythm: Normal rate. Rhythm irregular.     Comments: 2+ BLE pitting edema Pulmonary:     Effort: Pulmonary effort is normal. No respiratory distress.     Breath sounds: Normal breath sounds.  Musculoskeletal:        General: Tenderness (TTP over L spine) present. No deformity.  Skin:    General: Skin is warm and dry.  Neurological:     Mental Status: She is alert.     ED Results / Procedures / Treatments   Labs (all labs ordered are listed, but only abnormal results are displayed) Labs Reviewed  CBC WITH DIFFERENTIAL/PLATELET - Abnormal; Notable for the following components:      Result Value   RBC 3.27 (*)    Hemoglobin 10.2 (*)    HCT 30.0 (*)    All other components within normal limits  BASIC METABOLIC PANEL - Abnormal; Notable for the following components:   Sodium 121 (*)    Chloride 83 (*)    Glucose, Bld 106 (*)    BUN 44 (*)    Creatinine, Ser 1.21 (*)    Calcium 8.7 (*)    GFR, Estimated 44 (*)    All other components within normal limits  URINALYSIS, ROUTINE W REFLEX MICROSCOPIC  BRAIN NATRIURETIC PEPTIDE     EKG None  Radiology DG Hip Unilat With Pelvis 2-3 Views Right  Result Date: 11/18/2022 CLINICAL DATA:  Hip pain.  Known L3 compression fracture. EXAM: DG HIP (WITH OR WITHOUT PELVIS) 3V RIGHT COMPARISON:  Lumbar CT from yesterday FINDINGS: Generalized osteopenia. Both hips are located. No evidence of fracture or diastasis. IMPRESSION: No acute finding. Electronically Signed   By: Jorje Guild M.D.   On: 11/18/2022 06:53   CT Lumbar Spine Wo Contrast  Result Date: 11/17/2022 CLINICAL DATA:  Mid to low back and bilateral sacroiliac joint pain. EXAM: CT LUMBAR SPINE WITHOUT CONTRAST TECHNIQUE: Multidetector CT imaging of the lumbar spine was performed without intravenous contrast administration. Multiplanar CT image reconstructions were also generated. RADIATION DOSE REDUCTION: This exam was performed according to the departmental dose-optimization program which includes automated exposure control, adjustment of the mA and/or kV according to patient size and/or use of iterative reconstruction technique. COMPARISON:  CT abdomen pelvis dated September 01, 2022. FINDINGS: Segmentation: 5 lumbar type vertebrae. Alignment: Unchanged focal kyphosis at L1 with mild retrolisthesis at L1-L2. Vertebrae: New acute to subacute L3 inferior endplate compression fracture with up to 35% height loss. No retropulsion. Chronic L1 inferior endplate compression deformity. Osteopenia. Paraspinal and other soft tissues: Aortoiliac atherosclerotic vascular disease. Old granulomatous disease in the spleen. Disc levels: T12-L1:  Mild disc bulging.  No stenosis. L1-L2:  Mild retropulsion endplate spurring.  No stenosis. L2-L3: Minimal disc bulging and posterior endplate spurring. No stenosis. L3-L4: Mild disc bulging. Mild spinal canal and left neuroforaminal stenosis. No right neuroforaminal stenosis. L4-L5: Mild disc bulging and bilateral facet arthropathy. Prominent ligamentum flavum hypertrophy. Moderate spinal canal and  mild right neuroforaminal stenosis. No left neuroforaminal stenosis. L5-S1: Severe disc height loss with small circumferential disc osteophyte complex and mild bilateral facet arthropathy. No stenosis. IMPRESSION: 1. New acute to subacute L3 inferior endplate compression fracture with up to 35% height loss. No retropulsion. 2. Chronic L1 inferior endplate compression deformity. 3. Multilevel lumbar spondylosis as described above. Moderate spinal canal stenosis at L4-L5. 4.  Aortic Atherosclerosis (ICD10-I70.0). Electronically Signed   By: Titus Dubin M.D.   On: 11/17/2022 15:14    Procedures  Medications Ordered in ED Medications - No data to display  ED Course/ Medical Decision Making/ A&P   {                            Medical Decision Making Amount and/or Complexity of Data Reviewed Labs: ordered. Radiology: ordered.  Risk Decision regarding hospitalization.   Minnah Donaway [name] is a 88 y.o. female with PMH GERD, permanent HTN, A-fib, CAD, pulmonary hypertension, HFpEF with prior episode of Takotsubo cardiomyopathy, COPD coming to the ED for acute onset back pain.    Has point tenderness over her spine on exam and confirmed L3 compression fracture yesterday.  Likely not a good surgical candidate, but was referred to neurosurgery in the outpatient setting.  Came to the ED because her pain is becoming more severe.  Additionally, she is volume overloaded on exam with sodium of 121 in the ED.  BNP and chest x-ray pending.  Last echo in July 2023 showed EF 60 to 65% no regional wall motion abnormalities, moderately enlarged right ventricle, pulmonary artery hypertension, mild to moderate aortic stenosis and mitral stenosis, and mild to moderate TR with severely dilated right atrium.  She follows with Dr. Aundra Dubin who recently put her on metolazone 2.5 weekly.  She was briefly placed on oxygen in the ambulance on the way here, but is satting well on room air in the ED.  Believe she requires admission  for hypervolemic hyponatremia 2/2 CHF exacerbation.  Final Clinical Impression(s) / ED Diagnoses Final diagnoses:  Acute on chronic diastolic congestive heart failure (HCC)  Hyponatremia  Closed compression fracture of L3 vertebra, initial encounter ([place])    Rx / DC Orders ED Discharge Orders     None         Linus Galas, MD  11/18/22 [id number]    Lyndle Herrlich, MD 11/18/22 0858    Melene Plan, DO 11/18/22 1046    Melene Plan, DO 12/20/22 [phone]

## 2022-11-18 NOTE — ED Triage Notes (Signed)
Pt BIB EMS from home with c/o lower back pain. Pt was seen at ortho yesterday and was told she had a L3 fx and to come to the hospital. Pt states that she did not want to come yesterday and the pain has gotten worse.   100 fentanyl given with EMS  20 RAC  166/80  60 afib on the monitor with EMS

## 2022-11-18 NOTE — Telephone Encounter (Signed)
Pt's son, called this FYI  Pt is going to be admitted at [facility] due to fracture of the back, low K+ and fluid over-load.   Advise son to keep [place] posted.

## 2022-11-18 NOTE — ED Notes (Signed)
ED TO INPATIENT HANDOFF REPORT  ED Nurse Name and Phone #: Su Grand [phone]  S Name/Age/Gender [name] 88 y.o. female Room/Bed: 041C/041C  Code Status   Code Status: Prior  Home/SNF/Other Home Patient oriented to: self, place, time, and situation Is this baseline? Yes   Triage Complete: Triage complete  Chief Complaint Hyponatremia [E87.1]  Triage Note Pt BIB EMS from home with c/o lower back pain. Pt was seen at ortho yesterday and was told she had a L3 fx and to come to the hospital. Pt states that she did not want to come yesterday and the pain has gotten worse.   100 fentanyl given with EMS  20 RAC  166/80  60 afib on the monitor with EMS   Allergies Allergies  Allergen Reactions   Contrast Media [Iodinated Contrast Media] Hives and Other (See Comments)    Per pt strong burning sensation starting in chest radiating outward    Clonidine Derivatives Other (See Comments)    Throat dry   Statins Other (See Comments)    "bones hurt"   Sulfa Antibiotics Diarrhea    Tremors    Celebrex [Celecoxib] Rash   Isosorbide Nitrate Itching and Rash   Other Itching and Rash    Plastic and paper tape and heart monitor pads   Tape Itching and Rash    Red Where applied and will spread    Level of Care/Admitting Diagnosis ED Disposition     ED Disposition  Admit   Condition  --   Comment  Hospital Area: [facility] [[id number]]  Level of Care: Telemetry Cardiac [103]  [place] patient in observation at [facility] or [facility] if equivalent level of care is available:: No  Covid Evaluation: Asymptomatic - no recent exposure (last 10 days) testing not required  Diagnosis: Hyponatremia [198519]  Admitting Physician: Mercy Riding [id number]  Attending Physician: Mercy Riding [id number]          B Medical/Surgery History Past Medical History:  Diagnosis Date   Allergy    rhinitis   Aortic stenosis    mild by echo 06/2017   Arthritis     Bradycardia    a. 10/2017 -> beta blocker cut back due to HR 39.   Breast cancer ([place]) 01/06/2012   Cancer ([facility])    right colon and left breast   Chronic diastolic CHF (congestive heart failure) ([place])    Colon cancer ([place]) 01/06/2012   Colovesical fistula    Dr. Marlou Starks and [name] planning surgery 08/2018- surgery revealed spontaneous closure   COPD (chronic obstructive pulmonary disease) ([place])    pt. denies   Coronary artery disease 2006   a.  NSTEMI in 2016, cath showed 15% prox-mid RCA, 20% prox LAD, EF 25-35% by cath and 35-40% -> felt due to Takotsubo cardiomyopathy.   Dilated aortic root ([place])    42mHg by echo 06/2017   Diverticulosis    Dyspnea    Edema extremities    GERD (gastroesophageal reflux disease)    Hernia    Hiatal hernia    denies   Hyperlipidemia    Hypertension    Mild aortic stenosis    echo 11/2015 but not noted on echo 06/2016   Osteopenia    Permanent atrial fibrillation (HCC)    chronic atrial fibrillation   Pneumonia    hx child   Pulmonary HTN ([place]    a. moderate to severe PASP 646mg echo 11/2015 - now 4171m by  echo 06/2017. CTA chest in 11/16 with no PE. PFTs in 7/15 with mild obstructive lung disease. She had a negative sleep study in 2017. b. Felt due to left sided HF.   Stroke [facility])    Takotsubo syndrome 07/29/2015   a. EF 35-40% by echo; akinesis of mid-apical anteroseptal and apical myocardium.  EF now normalized on echo 11/2015   Past Surgical History:  Procedure Laterality Date   APPENDECTOMY     BREAST SURGERY     lumpectomy left   CARDIAC CATHETERIZATION     CARDIAC CATHETERIZATION N/A 07/28/2015   Procedure: Left Heart Cath and Coronary Angiography;  Surgeon: Peter M Martinique, MD;  Location: [facility];  Service: Cardiovascular;  Laterality: N/A;   CHOLECYSTECTOMY     COLECTOMY     right side   CYSTOSCOPY WITH STENT PLACEMENT Left 09/04/2018   Procedure: CYSTOSCOPY WITH LEFT STENT PLACEMENT, BLADDER REPAIR, CYSTOSCOPY WITH  LEFT STENT REMOVAL;  Surgeon: Raynelle Bring, MD;  Location: [facility];  Service: Urology;  Laterality: Left;   EXCISION OF ACCESSORY NIPPLE Bilateral 05/30/2013   Procedure: BILATERAL NIPPLE BIOPSY;  Surgeon: Merrie Roof, MD;  Location: [facility];  Service: General;  Laterality: Bilateral;   EYE SURGERY Bilateral 12   cataracts   HYSTEROSCOPY WITH D & C N/A 05/29/2020   Procedure: DILATATION AND CURETTAGE /HYSTEROSCOPY;  Surgeon: Homero Fellers, MD;  Location: [facility];  Service: Gynecology;  Laterality: N/A;   IR RADIOLOGIST EVAL & MGMT  07/26/2017   LAPAROSCOPIC RIGHT COLECTOMY N/A 09/04/2018   Procedure: LAPAROSCOPIC ASSISTED SIGMOID COLECTOMY WITH REPAIR OF FISTULA TO BLADDER;  Surgeon: Jovita Kussmaul, MD;  Location: [facility];  Service: General;  Laterality: N/A;   RIGHT/LEFT HEART CATH AND CORONARY ANGIOGRAPHY N/A 12/10/2020   Procedure: RIGHT/LEFT HEART CATH AND CORONARY ANGIOGRAPHY;  Surgeon: Larey Dresser, MD;  Location: [facility];  Service: Cardiovascular;  Laterality: N/A;   SPLIT NIGHT STUDY  02/02/2016         A IV Location/Drains/Wounds Patient Lines/Drains/Airways Status     Active Line/Drains/Airways     Name Placement date Placement time Site Days   Peripheral IV 10/29/22 20 G Anterior;Right Forearm 10/29/22  1455  Forearm  20   Wound / Incision (Open or Dehisced) 03/15/22 Skin tear Arm Left;Lower;Posterior 03/15/22  2228  Arm  248            Intake/Output Last 24 hours No intake or output data in the 24 hours ending 11/18/22 0959  Labs/Imaging Results for orders placed or performed during the hospital encounter of 11/18/22 (from the past 48 hour(s))  CBC with Differential     Status: Abnormal   Collection Time: 11/18/22  6:35 AM  Result Value Ref Range   WBC 5.7 4.0 - 10.5 K/uL   RBC 3.27 (L) 3.87 - 5.11 MIL/uL   Hemoglobin 10.2 (L) 12.0 - 15.0 g/dL   HCT 30.0 (L) 36.0 - 46.0 %   MCV 91.7 80.0 - 100.0 fL   MCH 31.2 26.0 - 34.0 pg   MCHC 34.0  30.0 - 36.0 g/dL   RDW 13.0 11.5 - 15.5 %   Platelets 200 150 - 400 K/uL   nRBC 0.0 0.0 - 0.2 %   Neutrophils Relative % 66 %   Neutro Abs 3.8 1.7 - 7.7 K/uL   Lymphocytes Relative 20 %   Lymphs Abs 1.2 0.7 - 4.0 K/uL   Monocytes Relative 12 %   Monocytes  Absolute 0.7 0.1 - 1.0 K/uL   Eosinophils Relative 1 %   Eosinophils Absolute 0.0 0.0 - 0.5 K/uL   Basophils Relative 0 %   Basophils Absolute 0.0 0.0 - 0.1 K/uL   Immature Granulocytes 1 %   Abs Immature Granulocytes 0.03 0.00 - 0.07 K/uL    Comment: Performed at [facility] [address] [place], [place] [id number]  Basic metabolic panel     Status: Abnormal   Collection Time: 11/18/22  6:35 AM  Result Value Ref Range   Sodium 121 (L) 135 - 145 mmol/L   Potassium 3.8 3.5 - 5.1 mmol/L   Chloride 83 (L) 98 - 111 mmol/L   CO2 26 22 - 32 mmol/L   Glucose, Bld 106 (H) 70 - 99 mg/dL    Comment: Glucose reference range applies only to samples taken after fasting for at least 8 hours.   BUN 44 (H) 8 - 23 mg/dL   Creatinine, Ser 1.21 (H) 0.44 - 1.00 mg/dL   Calcium 8.7 (L) 8.9 - 10.3 mg/dL   GFR, Estimated 44 (L) >60 mL/min    Comment: (NOTE) Calculated using the CKD-EPI Creatinine Equation (2021)    Anion gap 12 5 - 15    Comment: Performed at [facility] [address] [place], [place] [address]   *Note: Due to a large number of results and/or encounters for the requested time period, some results have not been displayed. A complete set of results can be found in Results Review.   DG Chest 1 View  Result Date: 11/18/2022 CLINICAL DATA:  Shortness of breath. EXAM: CHEST  1 VIEW COMPARISON:  10/29/2022 FINDINGS: The heart is mildly enlarged but stable. Stable tortuosity and calcification of the thoracic aorta. Mild chronic bronchitic type lung changes but no infiltrates, edema or effusions. No pulmonary lesions or pneumothorax. The bony thorax is intact. IMPRESSION: Mild cardiac enlargement and chronic lung changes  but no acute pulmonary findings. Electronically Signed   By: Marijo Sanes M.D.   On: 11/18/2022 08:59   DG Hip Unilat With Pelvis 2-3 Views Right  Result Date: 11/18/2022 CLINICAL DATA:  Hip pain.  Known L3 compression fracture. EXAM: DG HIP (WITH OR WITHOUT PELVIS) 3V RIGHT COMPARISON:  Lumbar CT from yesterday FINDINGS: Generalized osteopenia. Both hips are located. No evidence of fracture or diastasis. IMPRESSION: No acute finding. Electronically Signed   By: Jorje Guild M.D.   On: 11/18/2022 06:53   CT Lumbar Spine Wo Contrast  Result Date: 11/17/2022 CLINICAL DATA:  Mid to low back and bilateral sacroiliac joint pain. EXAM: CT LUMBAR SPINE WITHOUT CONTRAST TECHNIQUE: Multidetector CT imaging of the lumbar spine was performed without intravenous contrast administration. Multiplanar CT image reconstructions were also generated. RADIATION DOSE REDUCTION: This exam was performed according to the departmental dose-optimization program which includes automated exposure control, adjustment of the mA and/or kV according to patient size and/or use of iterative reconstruction technique. COMPARISON:  CT abdomen pelvis dated September 01, 2022. FINDINGS: Segmentation: 5 lumbar type vertebrae. Alignment: Unchanged focal kyphosis at L1 with mild retrolisthesis at L1-L2. Vertebrae: New acute to subacute L3 inferior endplate compression fracture with up to 35% height loss. No retropulsion. Chronic L1 inferior endplate compression deformity. Osteopenia. Paraspinal and other soft tissues: Aortoiliac atherosclerotic vascular disease. Old granulomatous disease in the spleen. Disc levels: T12-L1:  Mild disc bulging.  No stenosis. L1-L2:  Mild retropulsion endplate spurring.  No stenosis. L2-L3: Minimal disc bulging and posterior endplate spurring.  No stenosis. L3-L4: Mild disc bulging. Mild spinal canal and left neuroforaminal stenosis. No right neuroforaminal stenosis. L4-L5: Mild disc bulging and bilateral facet  arthropathy. Prominent ligamentum flavum hypertrophy. Moderate spinal canal and mild right neuroforaminal stenosis. No left neuroforaminal stenosis. L5-S1: Severe disc height loss with small circumferential disc osteophyte complex and mild bilateral facet arthropathy. No stenosis. IMPRESSION: 1. New acute to subacute L3 inferior endplate compression fracture with up to 35% height loss. No retropulsion. 2. Chronic L1 inferior endplate compression deformity. 3. Multilevel lumbar spondylosis as described above. Moderate spinal canal stenosis at L4-L5. 4.  Aortic Atherosclerosis (ICD10-I70.0). Electronically Signed   By: Titus Dubin M.D.   On: 11/17/2022 15:14    Pending Labs Unresulted Labs (From admission, onward)     Start     Ordered   11/18/22 0819  Brain natriuretic peptide  Once,   URGENT        11/18/22 0819   11/18/22 0611  Urinalysis, Routine w reflex microscopic -Urine, Clean Catch  Once,   URGENT       Question:  Specimen Source  Answer:  Urine, Clean Catch   11/18/22 0610            Vitals/Pain Today's Vitals   11/18/22 0600 11/18/22 0601 11/18/22 0932  BP:  (!) 163/46 (!) 167/74  Pulse:  62 68  Resp:  16 (!) 22  Temp:  98.2 F (36.8 C) 98.5 F (36.9 C)  TempSrc:   Oral  SpO2:  94%   PainSc: 10-Worst pain ever      Isolation Precautions No active isolations  Medications Medications  acetaminophen (TYLENOL) tablet 1,000 mg (1,000 mg Oral Given 11/18/22 0946)  oxyCODONE (Oxy IR/ROXICODONE) immediate release tablet 5 mg (has no administration in time range)  HYDROmorphone (DILAUDID) injection 0.5 mg (has no administration in time range)  furosemide (LASIX) injection 80 mg (80 mg Intravenous Given 11/18/22 0947)    Mobility walks     Focused Assessments Musculoskeletal   R Recommendations: See Admitting Provider Note  Report given to:   Additional Notes:

## 2022-11-18 NOTE — ED Provider Triage Note (Signed)
Emergency Medicine Provider Triage Evaluation Note  [name] , a 88 y.o. female  was evaluated in triage.  Pt complains of low back pain, right hip, at ortho yesterday for L3 compression fx.  Review of Systems  Positive: Low back pain, right hip pain Negative: Dysuria   Physical Exam  BP (!) 163/46 (BP Location: Right Arm)   Pulse 62   Temp 98.2 F (36.8 C)   Resp 16   SpO2 94%  Gen:   Awake, no distress   Resp:  Normal effort  MSK:   Moves extremities without difficulty  Other:  No pain with log roll right hip/flex right hip and knee  Medical Decision Making  Medically screening exam initiated at 6:08 AM.  Appropriate orders placed.  [name] was informed that the remainder of the evaluation will be completed by another provider, this initial triage assessment does not replace that evaluation, and the importance of remaining in the ED until their evaluation is complete.     Tacy Learn, PA-C 11/18/22 [phone]

## 2022-11-19 ENCOUNTER — Encounter (HOSPITAL_COMMUNITY): Payer: Self-pay | Admitting: Student

## 2022-11-19 DIAGNOSIS — I2510 Atherosclerotic heart disease of native coronary artery without angina pectoris: Secondary | ICD-10-CM | POA: Diagnosis present

## 2022-11-19 DIAGNOSIS — I4821 Permanent atrial fibrillation: Secondary | ICD-10-CM | POA: Diagnosis present

## 2022-11-19 DIAGNOSIS — E871 Hypo-osmolality and hyponatremia: Secondary | ICD-10-CM | POA: Diagnosis not present

## 2022-11-19 DIAGNOSIS — Z85038 Personal history of other malignant neoplasm of large intestine: Secondary | ICD-10-CM | POA: Diagnosis not present

## 2022-11-19 DIAGNOSIS — Z7901 Long term (current) use of anticoagulants: Secondary | ICD-10-CM | POA: Diagnosis not present

## 2022-11-19 DIAGNOSIS — I252 Old myocardial infarction: Secondary | ICD-10-CM | POA: Diagnosis not present

## 2022-11-19 DIAGNOSIS — W2203XA Walked into furniture, initial encounter: Secondary | ICD-10-CM | POA: Diagnosis not present

## 2022-11-19 DIAGNOSIS — J449 Chronic obstructive pulmonary disease, unspecified: Secondary | ICD-10-CM | POA: Diagnosis present

## 2022-11-19 DIAGNOSIS — M8008XA Age-related osteoporosis with current pathological fracture, vertebra(e), initial encounter for fracture: Secondary | ICD-10-CM | POA: Diagnosis present

## 2022-11-19 DIAGNOSIS — Z8249 Family history of ischemic heart disease and other diseases of the circulatory system: Secondary | ICD-10-CM | POA: Diagnosis not present

## 2022-11-19 DIAGNOSIS — K5900 Constipation, unspecified: Secondary | ICD-10-CM | POA: Diagnosis present

## 2022-11-19 DIAGNOSIS — E875 Hyperkalemia: Secondary | ICD-10-CM | POA: Diagnosis present

## 2022-11-19 DIAGNOSIS — Z6828 Body mass index (BMI) 28.0-28.9, adult: Secondary | ICD-10-CM | POA: Diagnosis not present

## 2022-11-19 DIAGNOSIS — Z91041 Radiographic dye allergy status: Secondary | ICD-10-CM | POA: Diagnosis not present

## 2022-11-19 DIAGNOSIS — K219 Gastro-esophageal reflux disease without esophagitis: Secondary | ICD-10-CM | POA: Diagnosis present

## 2022-11-19 DIAGNOSIS — I130 Hypertensive heart and chronic kidney disease with heart failure and stage 1 through stage 4 chronic kidney disease, or unspecified chronic kidney disease: Secondary | ICD-10-CM | POA: Diagnosis present

## 2022-11-19 DIAGNOSIS — N1832 Chronic kidney disease, stage 3b: Secondary | ICD-10-CM | POA: Diagnosis not present

## 2022-11-19 DIAGNOSIS — E669 Obesity, unspecified: Secondary | ICD-10-CM | POA: Diagnosis present

## 2022-11-19 DIAGNOSIS — Y92239 Unspecified place in hospital as the place of occurrence of the external cause: Secondary | ICD-10-CM | POA: Diagnosis not present

## 2022-11-19 DIAGNOSIS — D509 Iron deficiency anemia, unspecified: Secondary | ICD-10-CM | POA: Diagnosis present

## 2022-11-19 DIAGNOSIS — Z853 Personal history of malignant neoplasm of breast: Secondary | ICD-10-CM | POA: Diagnosis not present

## 2022-11-19 DIAGNOSIS — I5033 Acute on chronic diastolic (congestive) heart failure: Secondary | ICD-10-CM | POA: Diagnosis present

## 2022-11-19 DIAGNOSIS — I272 Pulmonary hypertension, unspecified: Secondary | ICD-10-CM | POA: Diagnosis present

## 2022-11-19 DIAGNOSIS — E785 Hyperlipidemia, unspecified: Secondary | ICD-10-CM | POA: Diagnosis present

## 2022-11-19 DIAGNOSIS — F419 Anxiety disorder, unspecified: Secondary | ICD-10-CM | POA: Diagnosis present

## 2022-11-19 DIAGNOSIS — Z79899 Other long term (current) drug therapy: Secondary | ICD-10-CM | POA: Diagnosis not present

## 2022-11-19 DIAGNOSIS — I4819 Other persistent atrial fibrillation: Secondary | ICD-10-CM | POA: Diagnosis not present

## 2022-11-19 DIAGNOSIS — I5032 Chronic diastolic (congestive) heart failure: Secondary | ICD-10-CM | POA: Diagnosis not present

## 2022-11-19 LAB — FOLATE: Folate: 10.1 ng/mL (ref >5.9)

## 2022-11-19 LAB — RENAL FUNCTION PANEL
Albumin: 3.1 g/dL — ABNORMAL LOW (ref 3.5–5.0)
Anion gap: 7 (ref 5–15)
BUN: 46 mg/dL — ABNORMAL HIGH (ref 8–23)
CO2: 27 mmol/L (ref 22–32)
Calcium: 8.5 mg/dL — ABNORMAL LOW (ref 8.9–10.3)
Chloride: 84 mmol/L — ABNORMAL LOW (ref 98–111)
Creatinine, Ser: 1.33 mg/dL — ABNORMAL HIGH (ref 0.44–1.00)
GFR, Estimated: 39 mL/min — ABNORMAL LOW (ref >60)
Glucose, Bld: 114 mg/dL — ABNORMAL HIGH (ref 70–99)
Phosphorus: 3.5 mg/dL (ref 2.5–4.6)
Potassium: 4.1 mmol/L (ref 3.5–5.1)
Sodium: 118 mmol/L — CL (ref 135–145)

## 2022-11-19 LAB — IRON AND TIBC
Iron: 32 ug/dL (ref 28–170)
Saturation Ratios: 10 % — ABNORMAL LOW (ref 10.4–31.8)
TIBC: 322 ug/dL (ref 250–450)
UIBC: 290 ug/dL

## 2022-11-19 LAB — CBC
HCT: 27.8 % — ABNORMAL LOW (ref 36.0–46.0)
Hemoglobin: 9.4 g/dL — ABNORMAL LOW (ref 12.0–15.0)
MCH: 31.1 pg (ref 26.0–34.0)
MCHC: 33.8 g/dL (ref 30.0–36.0)
MCV: 92.1 fL (ref 80.0–100.0)
Platelets: 177 10*3/uL (ref 150–400)
RBC: 3.02 MIL/uL — ABNORMAL LOW (ref 3.87–5.11)
RDW: 13 % (ref 11.5–15.5)
WBC: 5.3 10*3/uL (ref 4.0–10.5)
nRBC: 0 % (ref 0.0–0.2)

## 2022-11-19 LAB — VITAMIN B12: Vitamin B-12: 270 pg/mL (ref 180–914)

## 2022-11-19 LAB — FERRITIN: Ferritin: 70 ng/mL (ref 11–307)

## 2022-11-19 LAB — RETICULOCYTES
Immature Retic Fract: 5.7 % (ref 2.3–15.9)
RBC.: 3.04 MIL/uL — ABNORMAL LOW (ref 3.87–5.11)
Retic Count, Absolute: 51.1 10*3/uL (ref 19.0–186.0)
Retic Ct Pct: 1.7 % (ref 0.4–3.1)

## 2022-11-19 LAB — SODIUM
Sodium: 121 mmol/L — ABNORMAL LOW (ref 135–145)
Sodium: 122 mmol/L — ABNORMAL LOW (ref 135–145)
Sodium: 123 mmol/L — ABNORMAL LOW (ref 135–145)

## 2022-11-19 LAB — MAGNESIUM: Magnesium: 2.1 mg/dL (ref 1.7–2.4)

## 2022-11-19 MED ORDER — VITAMIN B-12 1000 MCG PO TABS
1000.0000 ug | ORAL_TABLET | Freq: Every day | ORAL | Status: DC
  Administered 2022-11-20 – 2022-11-24 (×5): 1000 ug via ORAL
  Filled 2022-11-19 (×5): qty 1

## 2022-11-19 MED ORDER — VITAMIN D 25 MCG (1000 UNIT) PO TABS
1000.0000 [IU] | ORAL_TABLET | Freq: Every day | ORAL | Status: DC
  Administered 2022-11-19 – 2022-11-24 (×6): 1000 [IU] via ORAL
  Filled 2022-11-19 (×6): qty 1

## 2022-11-19 MED ORDER — CYANOCOBALAMIN 1000 MCG/ML IJ SOLN
1000.0000 ug | Freq: Once | INTRAMUSCULAR | Status: AC
  Administered 2022-11-19: 1000 ug via INTRAMUSCULAR
  Filled 2022-11-19: qty 1

## 2022-11-19 MED ORDER — SODIUM CHLORIDE 1 G PO TABS
1.0000 g | ORAL_TABLET | Freq: Three times a day (TID) | ORAL | Status: DC
  Administered 2022-11-19 – 2022-11-22 (×9): 1 g via ORAL
  Filled 2022-11-19 (×9): qty 1

## 2022-11-19 MED ORDER — SODIUM CHLORIDE 0.9 % IV SOLN
250.0000 mg | Freq: Every day | INTRAVENOUS | Status: AC
  Administered 2022-11-19 – 2022-11-20 (×2): 250 mg via INTRAVENOUS
  Filled 2022-11-19 (×2): qty 20

## 2022-11-19 MED ORDER — APIXABAN 5 MG PO TABS
5.0000 mg | ORAL_TABLET | Freq: Once | ORAL | Status: AC
  Administered 2022-11-19: 5 mg via ORAL
  Filled 2022-11-19: qty 1

## 2022-11-19 MED ORDER — HEPARIN (PORCINE) 25000 UT/250ML-% IV SOLN
900.0000 [IU]/h | INTRAVENOUS | Status: DC
  Administered 2022-11-20: 950 [IU]/h via INTRAVENOUS
  Administered 2022-11-21: 900 [IU]/h via INTRAVENOUS
  Filled 2022-11-19 (×2): qty 250

## 2022-11-19 NOTE — Progress Notes (Addendum)
Received a call from bedside RN regarding chronic hyponatremia with serum sodium initially improving but now dropping from 120 to 118.  The patient is currently on NS at 26 cc/h with renal function test every 8 hours.  Liberalized her diet to regular with high-protein content and fluid restriction 1500 cc/day.  Added serum sodium lab at 0700 to trend.  [place] consider nephrology consult if serum sodium is refractory to normal saline, salt tablets, and remains below 120.  Will continue to closely monitor and treat as indicated.

## 2022-11-19 NOTE — Progress Notes (Addendum)
MR reviewed with Dr. Estanislado Pandy, it showed acute compression fx in L4 which is new from CT L spine on 3/13.   Insurance approval updated for L3 and L4 KP. The procedure will be scheduled once approval is received.   Eliquis to be hold by primary team starting Saturday for possible procedure next week.  Will place lab/NPO/ formal consult note once insurance approval received.   Please call NIR for questions and concerns.   Armando Gang Field Staniszewski PA-C 11/19/2022 9:33 AM

## 2022-11-19 NOTE — Evaluation (Signed)
Occupational Therapy Evaluation Patient Details Name: [name] MRN: [id number] DOB: [birth year] Today's Date: 11/19/2022   History of Present Illness 88 y.o. female admitted with back pain. Pt with acute L3 inferior endplate compression fracture, and multilevel lumbar spondylosis and moderate spinal canal stenosis at L4-L5; hyponatremia. PMH of diastolic CHF, [id number], A-fib on Eliquis, COPD, CAD, left breast cancer, colon cancer, GERD and anxiety.   Clinical Impression   PTA pt lives alone independently, receives Meals on Wheels and son assists with taking her to appointments and as he can with IADL tasks, however he is disabled and unable to assist physically after DC. [name] had started receiving HHPT @ 4 wks PTA.  Pt in significant pain, impacting her ability to mobilize and complete ADL tasks. Able to pivot to [facility], however unable to walk at this time. Requires overall mod to Max A with LB ADL tasks due to below listed deficits. At this time [name] will benefit from rehab at [facility], however pt is hopeful to progress to DC home with [facility] if she has the kyphoplasty and pain is controlled. Acute OT will follow.      Recommendations for follow up therapy are one component of a multi-disciplinary discharge planning process, led by the attending physician.  Recommendations [place] be updated based on patient status, additional functional criteria and insurance authorization.   Follow Up Recommendations  Skilled nursing-short term rehab (<3 hours/day) (pending progress)     Assistance Recommended at Discharge Frequent or constant Supervision/Assistance  Patient can return home with the following A lot of help with walking and/or transfers;A lot of help with bathing/dressing/bathroom;Assistance with cooking/housework;Assist for transportation;Help with stairs or ramp for entrance    Functional Status Assessment  Patient has had a recent decline in their functional status and demonstrates the ability to make  significant improvements in function in a reasonable and predictable amount of time.  Equipment Recommendations  None recommended by OT    Recommendations for Other Services       Precautions / Restrictions Precautions Precautions: Fall;Back;Other (comment) (back for comfort) Restrictions Weight Bearing Restrictions: No      Mobility Bed Mobility Overal bed mobility: Needs Assistance Bed Mobility: Supine to Sit, Sit to Supine     Supine to sit: Min assist Sit to supine: Mod assist   General bed mobility comments: Pt states "I can't roll, it hurs to much"; HOB elveated to help scoot to EOB    Transfers Overall transfer level: Needs assistance Equipment used: 1 person hand held assist Transfers: Sit to/from Stand, Bed to chair/wheelchair/BSC Sit to Stand: Min assist     Step pivot transfers: Min assist            Balance     Sitting balance-Leahy Scale: Fair       Standing balance-Leahy Scale: Poor                             ADL either performed or assessed with clinical judgement   ADL Overall ADL's : Needs assistance/impaired     Grooming: Set up   Upper Body Bathing: Set up;Sitting   Lower Body Bathing: Moderate assistance;Sit to/from stand   Upper Body Dressing : Minimal assistance   Lower Body Dressing: Moderate assistance;Sit to/from stand   Toilet Transfer: Minimal assistance;Stand-pivot;BSC/3in1   Toileting- Clothing Manipulation and Hygiene: Moderate assistance Toileting - Clothing Manipulation Details (indicate cue type and reason): very painful  Functional mobility during ADLs: Minimal assistance (HHA) Lynde benefit from use of AE       Vision Baseline Vision/History: 1 Wears glasses (at all times; difficulty reading without glasses) Ability to See in Adequate Light: 2 Moderately impaired Patient Visual Report: Blurring of vision (difficulty with reading) Additional Comments: had cataracts removed     Perception      Praxis      Pertinent Vitals/Pain Pain Assessment Pain Assessment: 0-10 Pain Score: 5  Pain Location: back Pain Descriptors / Indicators: Sharp, Shooting Pain Intervention(s): Limited activity within patient's tolerance     Hand Dominance Right   Extremity/Trunk Assessment Upper Extremity Assessment Upper Extremity Assessment: LUE deficits/detail LUE Deficits / Details: L shoulder genrally "weak" but functional   Lower Extremity Assessment Lower Extremity Assessment: Defer to PT evaluation ("flles  like my feet dead")   Cervical / Trunk Assessment Cervical / Trunk Assessment: Other exceptions (acute L3 compression fx)   Communication Communication Communication: No difficulties;HOH   Cognition Arousal/Alertness: Awake/alert Behavior During Therapy: WFL Overall Cognitive Status: Within Functional Limits for tasks assessed                                       General Comments  VSS on RA    Exercises     Shoulder Instructions      Home Living Family/patient expects to be discharged to:: Private residence Living Arrangements: Alone Available Help at Discharge: Family;Neighbor;Available 24 hours/day (Son lives next door and can help out if need be); son is on disability and doesn't "move very well" Type of Home: House Home Access: Level entry     Home Layout: One level     Bathroom Shower/Tub: Occupational psychologist: Handicapped height Bathroom Accessibility: No   Home Equipment: Rollator (4 wheels);Shower seat - built in;Grab bars - toilet;Grab bars - tub/shower;Cane - single point;Wheelchair - Publishing copy (2 wheels);Hand held shower head          Prior Functioning/Environment Prior Level of Function : Independent/Modified Independent             Mobility Comments: Ind no AD but every now and then would use cane ADLs Comments: Independent however has required more assistance 3 days PTA due to back pain; did  sponges bathes at times due to anxiety of showering alone. [employer] on Wheels; son helps with groceries and appointments. Has been receiving HHPT @ 4 weeks PTA        OT Problem List: Decreased strength;Decreased activity tolerance;Impaired balance (sitting and/or standing);Decreased safety awareness;Decreased knowledge of use of DME or AE;Decreased knowledge of precautions;Pain      OT Treatment/Interventions: Self-care/ADL training;DME and/or AE instruction;Therapeutic activities;Patient/family education    OT Goals(Current goals can be found in the care plan section) Acute Rehab OT Goals Patient Stated Goal: to be out of pain and be able to go home OT Goal Formulation: With patient Time For Goal Achievement: 12/03/22 Potential to Achieve Goals: Good  OT Frequency: Min 2X/week    Co-evaluation              AM-PAC OT "6 Clicks" Daily Activity     Outcome Measure Help from another person eating meals?: None Help from another person taking care of personal grooming?: A Little Help from another person toileting, which includes using toliet, bedpan, or urinal?: A Lot Help from another person bathing (including washing, rinsing, drying)?:  A Lot Help from another person to put on and taking off regular upper body clothing?: A Little Help from another person to put on and taking off regular lower body clothing?: A Lot 6 Click Score: 16   End of Session Equipment Utilized During Treatment: Gait belt Nurse Communication: Mobility status  Activity Tolerance: Patient limited by pain Patient left: in bed;with call bell/phone within reach;with bed alarm set;with family/visitor present  OT Visit Diagnosis: Unsteadiness on feet (R26.81);Other abnormalities of gait and mobility (R26.89);Muscle weakness (generalized) (M62.81);Pain Pain - part of body:  (pain)                Time: 1140-1210 OT Time Calculation (min): 30 min Charges:  OT General Charges $OT Visit: 1 Visit OT  Evaluation $OT Eval Moderate Complexity: 1 Mod OT Treatments $Self Care/Home Management : 8-22 mins  Maurie Boettcher, OT/L   Acute OT Clinical Specialist [place] Pager [phone] Office [phone]   [facility] 11/19/2022, 1:08 PM

## 2022-11-19 NOTE — NC FL2 (Signed)
[place] LEVEL OF CARE FORM     IDENTIFICATION  Patient Name: [name] Birthdate: May 02, 1935 Sex: female Admission Date (Current Location): 11/18/2022  [facility] and [place] Number:  Herbalist and Address:  [facility] [place], [place] [address], [place], [place] [address]      Provider Number: [id number]  Attending Physician Name and Address:  Mercy Riding, MD  Relative Name and Phone Number:       Current Level of Care: Hospital Recommended Level of Care: [place] Prior Approval Number:    Date Approved/Denied:   PASRR Number: [id number]  Discharge Plan: SNF    Current Diagnoses: Patient Active Problem List   Diagnosis Date Noted   Hyponatremia 11/18/2022   Lower back pain 11/18/2022   Normocytic anemia 11/18/2022   Closed compression fracture of L3 lumbar vertebra, initial encounter ([place]) 11/17/2022   Acute bacterial sinusitis 10/27/2022   Abnormal results on imaging study of genitourinary system 03/25/2022   NSTEMI (non-ST elevated myocardial infarction) ([place])    Persistent atrial fibrillation ([place]) 03/15/2022   Chronic kidney disease, stage 3b ([place]) 03/15/2022   Elevated troponin 03/15/2022   Postmenopausal bleeding 06/05/2020   Chest pain 07/23/2019   Diverticulitis large intestine w/o perforation or abscess w/o bleeding 09/04/2018   Colovesical fistula    Preoperative clearance 08/07/2018   TIA (transient ischemic attack) 01/06/2018   Breast cancer ([place]) 09/13/2017   Obesity, unspecified 09/13/2017   Warfarin anticoagulation 09/13/2017   Uterine mass 09/07/2017   Adnexal mass 08/03/2017   Pelvic abscess in female 07/07/2017   Aortic stenosis    Dyslipidemia, goal LDL below 70 11/28/2016   Monitoring for long-term anticoagulant use 12/31/2015   Chronic diastolic CHF (congestive heart failure) ([place]) 10/28/2015   Insomnia 10/23/2015   Leg swelling 08/11/2015   Takotsubo syndrome 07/29/2015   DOE  (dyspnea on exertion) 08/15/2014   Dilated aortic root (HCC)    Pulmonary HTN ([place])    Permanent atrial fibrillation ([place]) 07/04/2013   Chronic anticoagulation 07/04/2013   Coronary artery disease    Lesion of nipple 05/15/2013   GERD (gastroesophageal reflux disease)    Hypertension    COPD (chronic obstructive pulmonary disease) ([place])    Osteopenia    Diverticulosis    Hiatal hernia    Abdominal pain 01/11/2012   Breast cancer of upper-outer quadrant of left female breast ([place]) 01/06/2012   Colon cancer ([place]) 01/06/2012    Orientation RESPIRATION BLADDER Height & Weight     Self, Time, Situation, Place  Normal Continent Weight: 173 lb 11.6 oz (78.8 kg) Height:  5\' 5"  (165.1 cm)  BEHAVIORAL SYMPTOMS/MOOD NEUROLOGICAL BOWEL NUTRITION STATUS      Continent Diet (See dc summary)  AMBULATORY STATUS COMMUNICATION OF NEEDS Skin   Extensive Assist Verbally Normal                       Personal Care Assistance Level of Assistance  Bathing, Feeding, Dressing Bathing Assistance: Maximum assistance Feeding assistance: Independent Dressing Assistance: Maximum assistance     Functional Limitations Info  Sight, Hearing, Speech Sight Info: Adequate Hearing Info: Adequate Speech Info: Adequate    SPECIAL CARE FACTORS FREQUENCY  PT (By licensed PT), OT (By licensed OT)     PT Frequency: 5xweek OT Frequency: 5xweek            Contractures Contractures Info: Not present    Additional Factors Info  Code Status, Allergies  Code Status Info: Full Allergies Info: Contrast Media (Iodinated Contrast Media)  Clonidine Derivatives  Statins  Sulfa Antibiotics  Celebrex (Celecoxib)  Isordil (Isosorbide Nitrate)  Tape           Current Medications (11/19/2022):  This is the current hospital active medication list Current Facility-Administered Medications  Medication Dose Route Frequency Provider Last Rate Last Admin   0.9 %  sodium chloride infusion   Intravenous Continuous  Mercy Riding, MD 65 mL/hr at 11/19/22 0908 New Bag at 11/19/22 0908   acetaminophen (TYLENOL) tablet 1,000 mg  1,000 mg Oral Q8H Gonfa, Taye T, MD   1,000 mg at 11/19/22 1433   apixaban (ELIQUIS) tablet 5 mg  5 mg Oral Once Carney, Gay Filler, RPH       cholecalciferol (VITAMIN D3) 25 MCG (1000 UNIT) tablet 1,000 Units  1,000 Units Oral Daily Wendee Beavers T, MD   1,000 Units at 11/19/22 1434   clonazePAM (KLONOPIN) tablet 0.5 mg  0.5 mg Oral BID PRN Mercy Riding, MD       cloNIDine (CATAPRES) tablet 0.1 mg  0.1 mg Oral BID Wendee Beavers T, MD   0.1 mg at 11/19/22 0944   [START ON 11/20/2022] cyanocobalamin (VITAMIN B12) tablet 1,000 mcg  1,000 mcg Oral Daily Gonfa, Charlesetta Ivory, MD       famotidine (PEPCID) tablet 20 mg  20 mg Oral QHS PRN Mercy Riding, MD       ferric gluconate (FERRLECIT) 250 mg in sodium chloride 0.9 % 250 mL IVPB  250 mg Intravenous Daily Wendee Beavers T, MD 135 mL/hr at 11/19/22 1438 250 mg at 11/19/22 1438   guaiFENesin-dextromethorphan (ROBITUSSIN DM) 100-10 MG/5ML syrup 5 mL  5 mL Oral Q4H PRN Mercy Riding, MD       [START ON 11/20/2022] heparin ADULT infusion 100 units/mL (25000 units/253mL)  950 Units/hr Intravenous Continuous Carney, Jessica C, RPH       HYDROmorphone (DILAUDID) injection 0.5 mg  0.5 mg Intravenous Q4H PRN Cyndia Skeeters, Taye T, MD   0.5 mg at 11/19/22 0559   ipratropium-albuterol (DUONEB) 0.5-2.5 (3) MG/3ML nebulizer solution 3 mL  3 mL Nebulization Q6H PRN Gonfa, Taye T, MD       mometasone-formoterol (DULERA) 100-5 MCG/ACT inhaler 2 puff  2 puff Inhalation BID Wendee Beavers T, MD   2 puff at 11/19/22 0908   oxyCODONE (Oxy IR/ROXICODONE) immediate release tablet 5 mg  5 mg Oral Q8H PRN Wendee Beavers T, MD   5 mg at 11/19/22 0943   phenol (CHLORASEPTIC) mouth spray 1 spray  1 spray Mouth/Throat PRN Gonfa, Taye T, MD       sodium chloride tablet 1 g  1 g Oral TID WC Mercy Riding, MD   1 g at 11/19/22 [id number]     Discharge Medications: Please see discharge summary for a list  of discharge medications.  Relevant Imaging Results:  Relevant Lab Results:   Additional Information SSN: [id number]  Beckey Rutter, MSW, [place] Transitions of Care  Clinical Social Worker I

## 2022-11-19 NOTE — Progress Notes (Signed)
   11/19/22 1556  Spiritual Encounters  Type of Visit Initial  Care provided to: Patient  Referral source Patient request  Reason for visit Routine spiritual support  OnCall Visit No  Spiritual Framework  Presenting Themes Values and beliefs;Goals in life/care;Coping tools;Community and relationships  Community/Connection Family  Patient Stress Factors Health changes;Loss of control;Major life changes  Intervention Outcomes  Outcomes Connection to spiritual care;Reduced isolation;Awareness of support   Chaplain responded to consult- Pt requested prayer.  Chaplain spoke with pt asking guided questions about purpose and faith, and practiced compassionate presence.    Chaplain understands that the PT feels isolated from her support community as her health has declined, and she expressed apprehension about having to go to skilled care rather than living at her home.  Chaplain validated and normalized her emotions, while exploring her faith and values, and the strength she can draw from her faith.    Chaplain related to her that she Lawson be alone in her hospital bed, but she is seen and known; this intervention seemed especially impactful and the PT began to cry. Chaplian finished with a prayer of blessing over the PT and she invited me to return.

## 2022-11-19 NOTE — Consult Note (Addendum)
   [facility] Inpatient Consult   11/19/2022  [name] [birth year] [id number]  [place] Organization [ACO] Patient:  [name] Medicare  Primary Care Provider:  [name], MD with [facility]   Patient is currently active with [facility] for chronic disease management services.  Patient has been engaged by a [facility] LCSW.  Our community based plan of care has focused on disease management and community resource support with [place] team outreach.  Patient will receive a post hospital call and will be evaluated for assessments and disease process education.    Met with the patient at the bedside, patient arrive to unit, states she is not feeling well, currently showing observation status. Son at bedside. Left a [facility] 24 hour nurse advise line magnet on bedside table. Patient awaiting medications.  Plan:  Notified Inpatient Transition Of Care [TOC] team member to make aware that [place] [facility] following.   Of note, [facility] Care Management services does not replace or interfere with any services that are needed or arranged by inpatient [facility] care management team.   For additional questions or referrals please contact:  Susan Brood, RN BSN [place]  [phone] business mobile phone Toll free office [phone]  *[place]  [phone] Fax number: [phone] [email or web address]

## 2022-11-19 NOTE — Evaluation (Addendum)
Physical Therapy Evaluation Patient Details Name: [name] MRN: [id number] DOB: Dec 08, 1934 Today's Date: 11/19/2022  History of Present Illness  88 year old female admitted 3/14 for back pain. PMH of diastolic CHF, [id number], A-fib on Eliquis, COPD, CAD, left breast cancer, colon cancer, GERD and anxiety.  Clinical Impression  Pt presents with admitting diagnosis above. Pt very limited by back pain today however able to stand once with min guard assist to RW. Pt stated that she could not feel her legs so further mobilization was deferred for safety. Anticipate that once pt back pain is controlled then pt will be able to mobilize much better. Based on todays session, recommend SNF given that pt lives alone and PLOF was independent. Per conservation with OT, pt receives Meals on Wheels and son assists with taking her to appointments and as he can with IADL tasks, however he is disabled and unable to assist physically after DC. Ms Kristiansen had started receiving HHPT @ 4 wks PTA. Pt could potentially progress HHPT once pain is better controlled.  PT will continue to follow.       Recommendations for follow up therapy are one component of a multi-disciplinary discharge planning process, led by the attending physician.  Recommendations Youse be updated based on patient status, additional functional criteria and insurance authorization.  Follow Up Recommendations Skilled nursing-short term rehab (<3 hours/day) Can patient physically be transported by private vehicle: No    Assistance Recommended at Discharge Frequent or constant Supervision/Assistance  Patient can return home with the following  A lot of help with bathing/dressing/bathroom;A lot of help with walking and/or transfers;Assistance with cooking/housework;Assist for transportation;Help with stairs or ramp for entrance    Equipment Recommendations Other (comment) (Per accepting facility)  Recommendations for Other Services       Functional Status  Assessment Patient has had a recent decline in their functional status and demonstrates the ability to make significant improvements in function in a reasonable and predictable amount of time.     Precautions / Restrictions Precautions Precautions: Fall Restrictions Weight Bearing Restrictions: No      Mobility  Bed Mobility Overal bed mobility: Needs Assistance Bed Mobility: Supine to Sit, Sit to Supine     Supine to sit: Min guard Sit to supine: Max assist   General bed mobility comments: Pt cued for log rolling however pt demonstrated poor carryover and stated that she was in too much pain. Pt required assistance for BLE management going from sit to supine.    Transfers Overall transfer level: Needs assistance Equipment used: Rolling walker (2 wheels) Transfers: Sit to/from Stand Sit to Stand: Min guard           General transfer comment: Cues for hand placement. Pt started marching in place once standing stating that she couldnt feel her legs.    Ambulation/Gait               General Gait Details: deferred due to pain and safety  Stairs            Wheelchair Mobility    Modified Rankin (Stroke Patients Only)       Balance Overall balance assessment: Needs assistance Sitting-balance support: Bilateral upper extremity supported, Feet supported Sitting balance-Leahy Scale: Fair Sitting balance - Comments: Pt with heavy lateral lean due to back pain   Standing balance support: Reliant on assistive device for balance, Bilateral upper extremity supported, During functional activity Standing balance-Leahy Scale: Poor Standing balance comment: Pt with heavy lean on  walker and stating that she couldnt feel her feet.                             Pertinent Vitals/Pain Pain Assessment Pain Assessment: No/denies pain    Home Living Family/patient expects to be discharged to:: Private residence Living Arrangements: Alone Available Help at  Discharge: Family;Neighbor;Available 24 hours/day (Son lives next door and can help out if need be) Type of Home: House Home Access: Level entry       Home Layout: One level Home Equipment: Rollator (4 wheels);Shower seat - built in;Grab bars - toilet;Grab bars - tub/shower;Cane - single point;Wheelchair - manual      Prior Function Prior Level of Function : Independent/Modified Independent             Mobility Comments: Ind no AD but every now and then would use cane ADLs Comments: Pt states ind but previous admission states she did sponges bathes at times due to anxiety of showering alone.     Hand Dominance   Dominant Hand: Right    Extremity/Trunk Assessment   Upper Extremity Assessment Upper Extremity Assessment: Overall WFL for tasks assessed    Lower Extremity Assessment Lower Extremity Assessment: Generalized weakness;RLE deficits/detail;LLE deficits/detail RLE Deficits / Details: BLE numbness RLE: Unable to fully assess due to pain RLE Sensation: decreased light touch;decreased proprioception LLE Deficits / Details: BLE numbness LLE: Unable to fully assess due to pain LLE Sensation: decreased light touch;decreased proprioception    Cervical / Trunk Assessment Cervical / Trunk Assessment: Kyphotic (L3 and L4 compression fracture)  Communication   Communication: No difficulties  Cognition Arousal/Alertness: Awake/alert Behavior During Therapy: Flat affect Overall Cognitive Status: Within Functional Limits for tasks assessed                                          General Comments General comments (skin integrity, edema, etc.): VSS on RA    Exercises     Assessment/Plan    PT Assessment Patient needs continued PT services  PT Problem List Decreased strength;Decreased range of motion;Decreased activity tolerance;Decreased balance;Decreased mobility;Decreased coordination;Decreased knowledge of use of DME;Decreased safety  awareness;Impaired sensation;Pain       PT Treatment Interventions DME instruction;Gait training;Functional mobility training;Therapeutic activities;Therapeutic exercise;Balance training;Neuromuscular re-education;Patient/family education    PT Goals (Current goals can be found in the Care Plan section)  Acute Rehab PT Goals Patient Stated Goal: to go home PT Goal Formulation: With patient Time For Goal Achievement: 12/03/22 Potential to Achieve Goals: Fair    Frequency Min 3X/week     Co-evaluation               AM-PAC PT "6 Clicks" Mobility  Outcome Measure Help needed turning from your back to your side while in a flat bed without using bedrails?: A Lot Help needed moving from lying on your back to sitting on the side of a flat bed without using bedrails?: A Lot Help needed moving to and from a bed to a chair (including a wheelchair)?: A Lot Help needed standing up from a chair using your arms (e.g., wheelchair or bedside chair)?: A Lot Help needed to walk in hospital room?: Total Help needed climbing 3-5 steps with a railing? : Total 6 Click Score: 10    End of Session Equipment Utilized During Treatment: Gait belt Activity Tolerance: Patient  limited by pain Patient left: in bed;with call bell/phone within reach;with bed alarm set Nurse Communication: Mobility status PT Visit Diagnosis: Other abnormalities of gait and mobility (R26.89);Pain Pain - Right/Left:  (Back and BLE) Pain - part of body: Leg (Back)    Time: [id number] PT Time Calculation (min) (ACUTE ONLY): 26 min   Charges:   PT Evaluation $PT Eval Moderate Complexity: 1 Mod PT Treatments $Therapeutic Activity: 23-37 mins        Shelby Mattocks, PT, DPT Acute Rehab Services [id number]   Viann Shove 11/19/2022, 11:51 AM

## 2022-11-19 NOTE — Plan of Care (Signed)
  Problem: Elimination: Goal: Will not experience complications related to urinary retention Outcome: Completed/Met   

## 2022-11-19 NOTE — Progress Notes (Signed)
Na 118, MD notified, Thanks Arvella Nigh RN.

## 2022-11-19 NOTE — Progress Notes (Signed)
ANTICOAGULATION CONSULT NOTE - Initial Consult  Pharmacy Consult for switch apixaban to IV heparin 3/16  Indication: atrial fibrillation  Allergies  Allergen Reactions   Contrast Media [Iodinated Contrast Media] Hives and Other (See Comments)    Chest pain and burning    Clonidine Derivatives Other (See Comments)    Dry mouth, throat   Statins Other (See Comments)    Myalgias   Sulfa Antibiotics Diarrhea    Tremors    Celebrex [Celecoxib] Rash   Isordil [Isosorbide Nitrate] Itching and Rash   Tape Itching and Rash    Redness at application site Reaction is to paper and plastic tapes and EKG leads    Patient Measurements: Height: 5\' 5"  (165.1 cm) Weight: 78.8 kg (173 lb 11.6 oz) IBW/kg (Calculated) : 57 Heparin Dosing Weight: ~ 78 kg  Vital Signs: Temp: 98.4 F (36.9 C) (03/15 1127) Temp Source: Oral (03/15 1127) BP: 154/61 (03/15 1127) Pulse Rate: 57 (03/15 1127)  Labs: Recent Labs    11/17/22 1151 11/17/22 1151 11/18/22 0635 11/18/22 1350 11/18/22 2046 11/19/22 0253  HGB 10.4*  --  10.2*  --   --  9.4*  HCT 32.0*  --  30.0*  --   --  27.8*  PLT 229  --  200  --   --  177  CREATININE 1.45*   < > 1.21* 1.29* 1.39* 1.33*   < > = values in this interval not displayed.    Estimated Creatinine Clearance: 32.1 mL/min (A) (by C-G formula based on SCr of 1.33 mg/dL (H)).   Medical History: Past Medical History:  Diagnosis Date   Allergy    rhinitis   Aortic stenosis    mild by echo 06/2017   Arthritis    Bradycardia    a. 10/2017 -> beta blocker cut back due to HR 39.   Breast cancer ([place]) 01/06/2012   Cancer ([place])    right colon and left breast   Chronic diastolic CHF (congestive heart failure) ([place])    Colon cancer ([place]) 01/06/2012   Colovesical fistula    Dr. Marlou Starks and [name] planning surgery 08/2018- surgery revealed spontaneous closure   COPD (chronic obstructive pulmonary disease) ([place])    pt. denies   Coronary artery disease 2006   a.  NSTEMI in  2016, cath showed 15% prox-mid RCA, 20% prox LAD, EF 25-35% by cath and 35-40% -> felt due to Takotsubo cardiomyopathy.   Dilated aortic root ([place])    79mmHg by echo 06/2017   Diverticulosis    Dyspnea    Edema extremities    GERD (gastroesophageal reflux disease)    Hernia    Hiatal hernia    denies   Hyperlipidemia    Hypertension    Mild aortic stenosis    echo 11/2015 but not noted on echo 06/2016   Osteopenia    Permanent atrial fibrillation (HCC)    chronic atrial fibrillation   Pneumonia    hx child   Pulmonary HTN ([place])    a. moderate to severe PASP 3mmHg echo 11/2015 - now 54mmHg by echo 06/2017. CTA chest in 11/16 with no PE. PFTs in 7/15 with mild obstructive lung disease. She had a negative sleep study in 2017. b. Felt due to left sided HF.   Stroke [facility])    Takotsubo syndrome 07/29/2015   a. EF 35-40% by echo; akinesis of mid-apical anteroseptal and apical myocardium.  EF now normalized on echo 11/2015    Medications:  Infusions:  sodium chloride 65 mL/hr at 11/19/22 0908   ferric gluconate (FERRLECIT) IVPB 250 mg (11/19/22 1438)   [START ON 11/20/2022] heparin      Assessment: 88 yo female on chronic Eliquis for afib now with need for L3 and L4 kyphoplasty.  Pharmacy asked to transition to IV heparin tomorrow in anticipation of OR next week.  Goal of Therapy:  Heparin level 0.3-0.7 units/ml Monitor platelets by anticoagulation protocol: Yes   Plan:  Give tonight's dose of Eliquis 5 mg.  Hold Eliquis after this dose. Then start heparin gtt tomorrow am at rate of 950 units/hr Heparin level and aPTT 8 hrs after gtt starts Daily heparin level and CBC.  [place] Crane, Roylene Reason, BCCP Clinical Pharmacist  11/19/2022 3:18 PM   [facility] pharmacy phone numbers are listed on [email or web address]

## 2022-11-19 NOTE — TOC Progression Note (Signed)
Transition of Care [facility]) - Progression Note    Patient Details  Name: [name] MRN: [id number] Date of Birth: 31-Jul-1935  Transition of Care [facility]) CM/SW Contact  Zenon Mayo, RN Phone Number: 11/19/2022, 12:24 PM  Clinical Narrative:    from home, hyponatremia, conts on ivf at 65, NA tablets.  TOC following.        Expected Discharge Plan and Services                                               Social Determinants of Health (SDOH) Interventions SDOH Screenings   Food Insecurity: No Food Insecurity (11/08/2022)  Housing: Low Risk  (11/08/2022)  Transportation Needs: No Transportation Needs (11/08/2022)  Utilities: Not At Risk (11/08/2022)  Alcohol Screen: Low Risk  (11/08/2022)  Depression (PHQ2-9): Low Risk  (11/08/2022)  Financial Resource Strain: Low Risk  (11/08/2022)  Recent Concern: Financial Resource Strain - Medium Risk (11/04/2022)  Physical Activity: Sufficiently Active (11/08/2022)  Recent Concern: Physical Activity - Insufficiently Active (08/11/2022)  Social Connections: Moderately Integrated (11/08/2022)  Recent Concern: Social Connections - Moderately Isolated (08/11/2022)  Stress: No Stress Concern Present (11/08/2022)  Tobacco Use: Low Risk  (11/18/2022)    Readmission Risk Interventions     No data to display

## 2022-11-19 NOTE — Progress Notes (Signed)
Chap attempted to visit Pt. Pt unavailable with medical staff in the room.  [name] will make another attempt to visit later.

## 2022-11-19 NOTE — TOC Initial Note (Signed)
Transition of Care [facility]) - Initial/Assessment Note    Patient Details  Name: [name] MRN: [id number] Date of Birth: [birth year]  Transition of Care [facility]) CM/SW Contact:    Bjorn Pippin, LCSW Phone Number: 11/19/2022, 3:34 PM  Clinical Narrative:                 CSW met with pt at bedside along with son present in the room to discuss the recommendation for STR at SNF. Pt is agreeable to go to SNF and requested to be near [place].   CSW completed fl2 and faxed out. CSW informed pt that weekend CSW will follow up with bed offers.   TOC will continue to follow this admission.   Expected Discharge Plan: Skilled Nursing Facility Barriers to Discharge: Continued Medical Work up   Patient Goals and CMS Choice            Expected Discharge Plan and Services       Living arrangements for the past 2 months: Single Family Home                                      Prior Living Arrangements/Services Living arrangements for the past 2 months: Single Family Home Lives with:: Self Patient language and need for interpreter reviewed:: Yes Do you feel safe going back to the place where you live?: Yes      Need for Family Participation in Patient Care: Yes (Comment) Care giver support system in place?: No (comment) Current home services: DME Criminal Activity/Legal Involvement Pertinent to Current Situation/Hospitalization: No - Comment as needed  Activities of Daily Living Home Assistive Devices/Equipment: Walker (specify type), Cane (specify quad or straight) ADL Screening (condition at time of admission) Patient's cognitive ability adequate to safely complete daily activities?: Yes Is the patient deaf or have difficulty hearing?: Yes (slightly HOH) Does the patient have difficulty seeing, even when wearing glasses/contacts?: Yes Does the patient have difficulty concentrating, remembering, or making decisions?: No Patient able to express need for assistance with ADLs?:  Yes Does the patient have difficulty dressing or bathing?: No Independently performs ADLs?: Yes (appropriate for developmental age) Does the patient have difficulty walking or climbing stairs?: Yes (due to L3 compression fx) Weakness of Legs: Both Weakness of Arms/Hands: Left  Permission Sought/Granted                  Emotional Assessment Appearance:: Appears stated age Attitude/Demeanor/Rapport: Engaged Affect (typically observed): Accepting Orientation: : Oriented to Self, Oriented to Place, Oriented to  Time, Oriented to Situation Alcohol / Substance Use: Not Applicable Psych Involvement: No (comment)  Admission diagnosis:  Hyponatremia [E87.1] Acute on chronic diastolic congestive heart failure (HCC) [I50.33] Closed compression fracture of L3 vertebra, initial encounter ([place]) [S32.030A] Patient Active Problem List   Diagnosis Date Noted   Hyponatremia 11/18/2022   Lower back pain 11/18/2022   Normocytic anemia 11/18/2022   Closed compression fracture of L3 lumbar vertebra, initial encounter ([place]) 11/17/2022   Acute bacterial sinusitis 10/27/2022   Abnormal results on imaging study of genitourinary system 03/25/2022   NSTEMI (non-ST elevated myocardial infarction) ([place])    Persistent atrial fibrillation ([place]) 03/15/2022   Chronic kidney disease, stage 3b ([place]) 03/15/2022   Elevated troponin 03/15/2022   Postmenopausal bleeding 06/05/2020   Chest pain 07/23/2019   Diverticulitis large intestine w/o perforation or abscess w/o bleeding 09/04/2018   Colovesical fistula  Preoperative clearance 08/07/2018   TIA (transient ischemic attack) 01/06/2018   Breast cancer ([place]) 09/13/2017   Obesity, unspecified 09/13/2017   Warfarin anticoagulation 09/13/2017   Uterine mass 09/07/2017   Adnexal mass 08/03/2017   Pelvic abscess in female 07/07/2017   Aortic stenosis    Dyslipidemia, goal LDL below 70 11/28/2016   Monitoring for long-term anticoagulant use 12/31/2015    Chronic diastolic CHF (congestive heart failure) ([place]) 10/28/2015   Insomnia 10/23/2015   Leg swelling 08/11/2015   Takotsubo syndrome 07/29/2015   DOE (dyspnea on exertion) 08/15/2014   Dilated aortic root (HCC)    Pulmonary HTN (HCC)    Permanent atrial fibrillation ([place]) 07/04/2013   Chronic anticoagulation 07/04/2013   Coronary artery disease    Lesion of nipple 05/15/2013   GERD (gastroesophageal reflux disease)    Hypertension    COPD (chronic obstructive pulmonary disease) ([place])    Osteopenia    Diverticulosis    Hiatal hernia    Abdominal pain 01/11/2012   Breast cancer of upper-outer quadrant of left female breast ([place]) 01/06/2012   Colon cancer ([place]) 01/06/2012   PCP:  [name], MD Pharmacy:   [place], [place] [address] Phone: [phone] Fax: [facility] [id number] - [place], [place] - [address] [place] [address] Phone: [phone] Fax: [phone]     Social Determinants of Health (SDOH) Social History: SDOH Screenings   Food Insecurity: No Food Insecurity (11/08/2022)  Housing: Low Risk  (11/08/2022)  Transportation Needs: No Transportation Needs (11/08/2022)  Utilities: Not At Risk (11/08/2022)  Alcohol Screen: Low Risk  (11/08/2022)  Depression (PHQ2-9): Low Risk  (11/08/2022)  Financial Resource Strain: Low Risk  (11/08/2022)  Recent Concern: Financial Resource Strain - Medium Risk (11/04/2022)  Physical Activity: Sufficiently Active (11/08/2022)  Recent Concern: Physical Activity - Insufficiently Active (08/11/2022)  Social Connections: Moderately Integrated (11/08/2022)  Recent Concern: Social Connections - Moderately Isolated (08/11/2022)  Stress: No Stress Concern Present (11/08/2022)  Tobacco Use: Low Risk  (11/19/2022)   SDOH Interventions:     Readmission Risk Interventions     No data to display         Beckey Rutter, MSW, LCSWA,  LCASA Transitions of Care  Clinical Social Worker I

## 2022-11-19 NOTE — Progress Notes (Signed)
PROGRESS NOTE  [name] [id number] DOB: 04-04-1935   PCP: [name], MD  Patient is from: Home. Lives alone. Uses cane at times.   DOA: 11/18/2022 LOS: 0  Chief complaints Chief Complaint  Patient presents with   Back Pain     Brief Narrative / Interim history: 88 y.o. female PMH of diastolic CHF, [id number], A-fib on Eliquis, COPD, CAD, left breast cancer, colon cancer, GERD and anxiety presenting with low back pain and right hip pain for 2 days.  Seen by PCP the day prior to presentation and had a CT that showed L3 inferior endplate compression fracture with 35% height loss but no retropulsion, and multilevel lumbar spondylosis and moderate spinal canal stenosis at L4-L5.  She was advised to go to the hospital but did not come until her pain was unbearable the next day.    In ED, she was hyponatremic to 121 (baseline 130s).  Pelvic x-ray without acute finding.  Initially, concern about hypervolemic hyponatremia and she was given IV Lasix.  However, creatinine and hyponatremia gotten worse.  She was started on gentle IV fluid.  IR consulted for kyphoplasty and recommended holding Eliquis starting 3/16.  MRI lumbar spine confirms CT finding.   Subjective: Seen and examined earlier this morning.  No major events overnight of this morning.  Complains pain in her back and right hip.  She rates back pain 5/10.  Pain is worse with movement.  Denies chest pain, shortness of breath.  Denies GI or UTI symptoms.  Objective: Vitals:   11/19/22 0000 11/19/22 0449 11/19/22 0725 11/19/22 1127  BP: (!) 159/60 (!) 135/53  (!) 154/61  Pulse: (!) 55 (!) 55  (!) 57  Resp: 20 20 19 20   Temp: (!) 97.5 F (36.4 C) 98 F (36.7 C) 98.4 F (36.9 C) 98.4 F (36.9 C)  TempSrc: Oral Oral Oral Oral  SpO2: 92% 93%  91%  Weight: 78.8 kg     Height:        Examination:  GENERAL: No apparent distress.  Nontoxic. HEENT: MMM.  Vision and hearing grossly intact.  NECK: Supple.  No apparent JVD.   RESP:  No IWOB.  Fair aeration bilaterally. CVS:  RRR.  2/6 SEM over RUSB. ABD/GI/GU: BS+. Abd soft, NTND.  MSK/EXT:  Moves extremities. No apparent deformity.  Trace BLE edema. SKIN: no apparent skin lesion or wound NEURO: Awake, alert and oriented appropriately.  No apparent focal neuro deficit. PSYCH: Calm. Normal affect.   Procedures:  None  Microbiology summarized: None  Assessment and plan: Principal Problem:   Hyponatremia Active Problems:   Persistent atrial fibrillation (HCC)   Hypertension   COPD (chronic obstructive pulmonary disease) (HCC)   Coronary artery disease   Chronic diastolic CHF (congestive heart failure) (HCC)   Chronic kidney disease, stage 3b (HCC)   Closed compression fracture of L3 lumbar vertebra, initial encounter (HCC)   Lower back pain   Normocytic anemia  Hyponatremia: Suspect hypovolemic hyponatremia.  Patient is on Aldactone and losartan which could contribute.  Seems like she was started on metolazone recently.  She has trace BLE edema which seems to be chronic.  No respiratory distress at the moment.  Received IV Lasix 80 mg in ED, Cr and sodium gotten worse.  TSH within normal. -Continue holding metolazone, Aldactone, losartan and further Lasix. -Continue IV NS at 65 cc an hour -Add p.o. sodium chloride 1 g 3 times daily with meals -Doubt utility of checking urine sodium while on  diuretics -Monitor serum sodium every 6 hours.   Acute lower back pain due to acute close L3 compression fracture: No reported trauma, fall or heavy lifting. CT showed L3 compression fracture with 35% height loss but no retropulsion.  MRI confirmed this.  Vitamin D level 25.11. -Pain control with scheduled Tylenol, as needed oxy and Dilaudid -Possible kyphoplasty next week pending insurance approval -PT/OT   Chronic diastolic CHF: Patient has chronic dyspnea and BLE edema unchanged from baseline.  No orthopnea or PND.  Overall, she appears euvolemic on exam.  CXR  without vascular congestion or edema.  BNP only 136.  TTE in 03/2022 with LVEF of 60 to 65%, RVSP of 50 mmHg.  She seems to be on torsemide 80 mg twice daily and Aldactone 25 mg daily.  Seems like metolazone was added recently.  Received IV Lasix 80 mg x 1 in ED. Cr-sodium worse after IV Lasix. -Continue holding diuretics -Closely monitor respiratory status and fluid status while on IV fluid -Strict intake and output, daily weight, renal functions and electrolytes   Chronic COPD: Stable. Ruthe Mannan instead of home Symbicort -DuoNebs as needed   History of CAD: No chest pain.  Seems to take Ranexa as needed at home -Holding losartan as above.   Persistent atrial fibrillation: Rate controlled without medications.  On low-dose Eliquis for anticoagulation -Change Eliquis to Lovenox for possible kyphoplasty.   Essential hypertension: BP slightly elevated. -Continue home clonidine -Hold home torsemide, Aldactone and losartan.   CKD-3B: Cr seems to be better than baseline. Recent Labs    09/20/22 1547 09/30/22 1635 10/15/22 1216 10/29/22 1244 11/10/22 1459 11/17/22 1151 11/18/22 0635 11/18/22 1350 11/18/22 2046 11/19/22 0253  BUN 61* 63* 71* 30* 42* 52* 44* 42* 43* 46*  CREATININE 1.18* 1.41* 1.59* 1.07* 1.44* 1.45* 1.21* 1.29* 1.39* 1.33*  -Continue holding diuretics and losartan as above. -Continue monitoring  Normocytic iron deficiency anemia: Slight drop in Hgb likely dilutional. Recent Labs    08/20/22 1159 08/31/22 1508 09/01/22 1214 09/10/22 1220 09/20/22 1547 09/28/22 0902 10/29/22 1244 11/17/22 1151 11/18/22 0635 11/19/22 0253  HGB 11.1* 11.3* 11.2* 10.3* 10.3* 10.7* 10.3* 10.4* 10.2* 9.4*  -IV ferric gluconate 250 mg daily x 2 -Vitamin B12 is 270.  Will give vitamin B12 injection followed by p.o.   Goal of care counseling/ACP: Full code.   -See discussion on 3/14.  Body mass index is 28.91 kg/m.          DVT prophylaxis:  On full dose  anticoagulation  Code Status: Full code Family Communication: Updated patient's son on 3/14.  None at bedside today Level of care: Telemetry Cardiac Status is: Observation The patient will require care spanning > 2 midnights and should be moved to inpatient because: Hyponatremia and acute lumbar spine fracture   Final disposition: TBD Consultants:  Interventional radiology  55 minutes with more than 50% spent in reviewing records, counseling patient/family and coordinating care.   Sch Meds:  Scheduled Meds:  acetaminophen  1,000 mg Oral Q8H   cholecalciferol  1,000 Units Oral Daily   cloNIDine  0.1 mg Oral BID   cyanocobalamin  1,000 mcg Intramuscular Once   Followed by   Derrill Memo ON 11/20/2022] vitamin B-12  1,000 mcg Oral Daily   mometasone-formoterol  2 puff Inhalation BID   sodium chloride  1 g Oral TID WC   Continuous Infusions:  sodium chloride 65 mL/hr at 11/19/22 0908   ferric gluconate (FERRLECIT) IVPB     PRN Meds:.clonazePAM, famotidine, guaiFENesin-dextromethorphan,  HYDROmorphone (DILAUDID) injection, ipratropium-albuterol, oxyCODONE, phenol  Antimicrobials: Anti-infectives (From admission, onward)    None        I have personally reviewed the following labs and images: CBC: Recent Labs  Lab 11/17/22 1151 11/18/22 0635 11/19/22 0253  WBC 5.1 5.7 5.3  NEUTROABS 3,259 3.8  --   HGB 10.4* 10.2* 9.4*  HCT 32.0* 30.0* 27.8*  MCV 92.5 91.7 92.1  PLT 229 200 177   BMP &GFR Recent Labs  Lab 11/17/22 1151 11/18/22 0635 11/18/22 1350 11/18/22 2046 11/19/22 0253 11/19/22 0718  NA 120* 121* 119* 120* 118* 121*  K 4.4 3.8 4.3 4.1 4.1  --   CL 83* 83* 81* 83* 84*  --   CO2 27 26 29 29 27   --   GLUCOSE 113* 106* 122* 140* 114*  --   BUN 52* 44* 42* 43* 46*  --   CREATININE 1.45* 1.21* 1.29* 1.39* 1.33*  --   CALCIUM 9.2 8.7* 9.0 8.7* 8.5*  --   MG  --   --  2.0  --  2.1  --   PHOS  --   --  3.1 3.5 3.5  --    Estimated Creatinine Clearance: 32.1  mL/min (A) (by C-G formula based on SCr of 1.33 mg/dL (H)). Liver & Pancreas: Recent Labs  Lab 11/17/22 1151 11/18/22 1350 11/18/22 2046 11/19/22 0253  AST 14  --   --   --   ALT 7  --   --   --   BILITOT 0.6  --   --   --   PROT 6.9  --   --   --   ALBUMIN  --  3.7 3.3* 3.1*   No results for input(s): "LIPASE", "AMYLASE" in the last 168 hours. No results for input(s): "AMMONIA" in the last 168 hours. Diabetic: No results for input(s): "HGBA1C" in the last 72 hours. No results for input(s): "GLUCAP" in the last 168 hours. Cardiac Enzymes: No results for input(s): "CKTOTAL", "CKMB", "CKMBINDEX", "TROPONINI" in the last 168 hours. No results for input(s): "PROBNP" in the last 8760 hours. Coagulation Profile: No results for input(s): "INR", "PROTIME" in the last 168 hours. Thyroid Function Tests: Recent Labs    11/18/22 1350  TSH 2.024   Lipid Profile: No results for input(s): "CHOL", "HDL", "LDLCALC", "TRIG", "CHOLHDL", "LDLDIRECT" in the last 72 hours. Anemia Panel: Recent Labs    11/19/22 0253  VITAMINB12 270  FOLATE 10.1  FERRITIN 70  TIBC 322  IRON 32  RETICCTPCT 1.7   Urine analysis:    Component Value Date/Time   COLORURINE STRAW (A) 11/18/2022 1006   APPEARANCEUR CLEAR 11/18/2022 1006   LABSPEC 1.006 11/18/2022 1006   PHURINE 5.0 11/18/2022 1006   GLUCOSEU NEGATIVE 11/18/2022 1006   HGBUR NEGATIVE 11/18/2022 1006   BILIRUBINUR NEGATIVE 11/18/2022 1006   KETONESUR NEGATIVE 11/18/2022 1006   PROTEINUR NEGATIVE 11/18/2022 1006   UROBILINOGEN 0.2 11/04/2013 1321   NITRITE NEGATIVE 11/18/2022 1006   LEUKOCYTESUR NEGATIVE 11/18/2022 1006   Sepsis Labs: Invalid input(s): "PROCALCITONIN", "LACTICIDVEN"  Microbiology: No results found for this or any previous visit (from the past 240 hour(s)).  Radiology Studies: MR LUMBAR SPINE WO CONTRAST  Result Date: 11/18/2022 CLINICAL DATA:  Back pain.  Lumbar compression fracture. EXAM: MRI LUMBAR SPINE WITHOUT  CONTRAST TECHNIQUE: Multiplanar, multisequence MR imaging of the lumbar spine was performed. No intravenous contrast was administered. COMPARISON:  CT lumbar spine 11/17/2022. Abdominopelvic CT 09/01/2022 and 07/16/2018. FINDINGS: Segmentation: Conventional anatomy  assumed, with the last open disc space designated L5-S1.Concordant with prior imaging. Alignment: Stable chronic retrolisthesis and mild focal kyphosis at L1-2. Vertebrae: As seen on recent lumbar spine CT, there is a subacute inferior endplate compression fracture at L3 resulting in approximately 20% loss of vertebral body height and no osseous retropulsion. There is mild marrow edema inferiorly in the vertebral body. There is also a mild inferior endplate compression fracture at L4 which results in approximately 10% loss of vertebral body height. There is also associated with mild marrow edema. Chronic unchanged inferior endplate compression deformity and Schmorl's node at L1. No suspicious osseous lesions. Conus medullaris: Extends to the T12-L1 level and appears normal. Paraspinal and other soft tissues: No significant paraspinal findings. Disc levels: L1-2: Chronic degenerative disc disease with loss of disc height, annular disc bulging and endplate osteophytes asymmetric to the right. Chronic mild right lateral recess and right foraminal narrowing without nerve root encroachment. L2-3: Chronic spondylosis with loss of disc height, disc bulging and endplate osteophytes asymmetric to the left. Mild narrowing of the left lateral recess and left foramen without nerve root encroachment. L3-4: Chronic loss of disc height with annular disc bulging, endplate osteophytes, facet and ligamentous hypertrophy. Resulting mild multifactorial spinal stenosis with mild lateral recess and foraminal narrowing bilaterally. L4-5: Disc height and hydration are maintained. There is mild disc bulging and moderate facet/ligamentous hypertrophy. Resulting moderate  multifactorial spinal stenosis with mild lateral recess and foraminal narrowing bilaterally. L5-S1: Chronic loss of disc height with annular disc bulging and circumferential endplate osteophytes. Mild bilateral facet hypertrophy. Chronic mild right-greater-than-left foraminal narrowing. The spinal canal and lateral recesses are patent. IMPRESSION: 1. Subacute inferior endplate compression fractures at L3 and L4 with mild associated marrow edema. No osseous retropulsion. 2. Stable chronic inferior endplate compression deformity at L1. 3. Multilevel spondylosis with resulting mild multifactorial spinal stenosis at L3-4 and moderate spinal stenosis at L4-5. No high-grade foraminal narrowing or definite nerve root encroachment. Electronically Signed   By: Richardean Sale M.D.   On: 11/18/2022 16:57      [name]  If 7PM-7AM, please contact night-coverage [email or web address] 11/19/2022, 12:39 PM

## 2022-11-19 NOTE — Consult Note (Addendum)
Chief Complaint: Patient was seen in consultation today for L3 compression fx Chief Complaint  Patient presents with   Back Pain   at the request of Wendee Beavers   Referring Physician(s): Wendee Beavers   Supervising Physician: Luanne Bras  Patient Status: [facility] - In-pt  History of Present Illness: [name] is a 88 y.o. female with PMHs of diastolic CHF, [id number], A-fib on Eliquis, COPD, CAD, left breast cancer, colon cancer, GERD and anxiety and lumbar compression fx who presents for KP.   Patient developed back pain 3-4 weeks ago which acutely gotten worse over the last 3-4 days, patient was seen by PCP and CT L spine was obtained on 3/13 which showed acute to subacute L3 inferior endplate compression fracture with 35% height loss but no retropulsion, multivessel lumbar spondylosis and moderate spinal canal stenosis at L4-L5. Patient was instructed to go to hospital by her PCP, but she did not do until her pain was unbearable in the morning of 3/14 when she decided to call EMS.   IR was requested for image guided L3 KP.  Imaging reviewed with Dr. Estanislado Pandy on 3/14, he recommended MR L spine w/o to further evaluate as there was questionable L3 cortex disruption seen in CT L spine. MR ordered which showed:   1. Subacute inferior endplate compression fractures at L3 and L4 with mild associated marrow edema. No osseous retropulsion. 2. Stable chronic inferior endplate compression deformity at L1. 3. Multilevel spondylosis with resulting mild multifactorial spinal stenosis at L3-4 and moderate spinal stenosis at L4-5. No high-grade foraminal narrowing or definite nerve root encroachment.   Patient is now approved for L3/L4 KP by Dr. Estanislado Pandy, informed by IR schedulers that the procedure will fall under the patient's inpatient auth.   Patient laying in bed, not in acute distress. Son at bedside. Reports persistent back pain.  Denise headache, fever, chills, shortness of breath,  cough, chest pain, abdominal pain, nausea ,vomiting, and bleeding.  Patient states that her back pain is tolerable when she is not moving, but as soon as she tries to move/sit up/roll/ walk, it becomes unbearable. Reports back pain becomes 8-10 out of 10 pain when she moves.  Patient is currently taking PO Tylenol 1000 mg q8r, has PO Oxy q8h PRN and IV Dilaudid q4h PRN.  Bone density scan on 02/2021 showed patient with osteoporosis, patient is on vitamin D.   Patient with known COPD and SOB at baseline per chart, was informed that she will laying on her belly for prolonged time for the procedure.  She states that her COPD is mild and she thinks she can laying on her belly for the procedure.  Patient is breathing in room air.    Past Medical History:  Diagnosis Date   Allergy    rhinitis   Aortic stenosis    mild by echo 06/2017   Arthritis    Bradycardia    a. 10/2017 -> beta blocker cut back due to HR 39.   Breast cancer ([facility]) 01/06/2012   Cancer ([place])    right colon and left breast   Chronic diastolic CHF (congestive heart failure) ([place])    Colon cancer ([place]) 01/06/2012   Colovesical fistula    Dr. Marlou Starks and [name] planning surgery 08/2018- surgery revealed spontaneous closure   COPD (chronic obstructive pulmonary disease) ([place])    pt. denies   Coronary artery disease 2006   a.  NSTEMI in 2016, cath showed 15% prox-mid RCA, 20% prox LAD,  EF 25-35% by cath and 35-40% -> felt due to Takotsubo cardiomyopathy.   Dilated aortic root ([place])    81mmHg by echo 06/2017   Diverticulosis    Dyspnea    Edema extremities    GERD (gastroesophageal reflux disease)    Hernia    Hiatal hernia    denies   Hyperlipidemia    Hypertension    Mild aortic stenosis    echo 11/2015 but not noted on echo 06/2016   Osteopenia    Permanent atrial fibrillation (HCC)    chronic atrial fibrillation   Pneumonia    hx child   Pulmonary HTN ([place])    a. moderate to severe PASP 36mmHg echo 11/2015 - now  88mmHg by echo 06/2017. CTA chest in 11/16 with no PE. PFTs in 7/15 with mild obstructive lung disease. She had a negative sleep study in 2017. b. Felt due to left sided HF.   Stroke [facility])    Takotsubo syndrome 07/29/2015   a. EF 35-40% by echo; akinesis of mid-apical anteroseptal and apical myocardium.  EF now normalized on echo 11/2015    Past Surgical History:  Procedure Laterality Date   APPENDECTOMY     BREAST SURGERY     lumpectomy left   CARDIAC CATHETERIZATION     CARDIAC CATHETERIZATION N/A 07/28/2015   Procedure: Left Heart Cath and Coronary Angiography;  Surgeon: Peter M Martinique, MD;  Location: [facility];  Service: Cardiovascular;  Laterality: N/A;   CHOLECYSTECTOMY     COLECTOMY     right side   CYSTOSCOPY WITH STENT PLACEMENT Left 09/04/2018   Procedure: CYSTOSCOPY WITH LEFT STENT PLACEMENT, BLADDER REPAIR, CYSTOSCOPY WITH LEFT STENT REMOVAL;  Surgeon: Raynelle Bring, MD;  Location: [facility];  Service: Urology;  Laterality: Left;   EXCISION OF ACCESSORY NIPPLE Bilateral 05/30/2013   Procedure: BILATERAL NIPPLE BIOPSY;  Surgeon: Merrie Roof, MD;  Location: [facility];  Service: General;  Laterality: Bilateral;   EYE SURGERY Bilateral 12   cataracts   HYSTEROSCOPY WITH D & C N/A 05/29/2020   Procedure: DILATATION AND CURETTAGE /HYSTEROSCOPY;  Surgeon: Homero Fellers, MD;  Location: [facility];  Service: Gynecology;  Laterality: N/A;   IR RADIOLOGIST EVAL & MGMT  07/26/2017   LAPAROSCOPIC RIGHT COLECTOMY N/A 09/04/2018   Procedure: LAPAROSCOPIC ASSISTED SIGMOID COLECTOMY WITH REPAIR OF FISTULA TO BLADDER;  Surgeon: Jovita Kussmaul, MD;  Location: [facility];  Service: General;  Laterality: N/A;   RIGHT/LEFT HEART CATH AND CORONARY ANGIOGRAPHY N/A 12/10/2020   Procedure: RIGHT/LEFT HEART CATH AND CORONARY ANGIOGRAPHY;  Surgeon: Larey Dresser, MD;  Location: [facility];  Service: Cardiovascular;  Laterality: N/A;   SPLIT NIGHT STUDY  02/02/2016         Allergies: Contrast media [iodinated contrast media], Clonidine derivatives, Statins, Sulfa antibiotics, Celebrex [celecoxib], Isordil [isosorbide nitrate], and Tape  Medications: Prior to Admission medications   Medication Sig Start Date End Date Taking? Authorizing Provider  acetaminophen (TYLENOL) 500 MG tablet Take 1,000 mg by mouth every 6 (six) hours as needed for headache or moderate pain.   Yes [provider]  albuterol (VENTOLIN HFA) 108 (90 Base) MCG/ACT inhaler Inhale 2 puffs into the lungs every 6 (six) hours as needed for wheezing or shortness of breath. 10/27/22  Yes Nadara Mustard, [employer] S, FNP  amLODipine (NORVASC) 5 MG tablet Take 1 tablet (5 mg total) by mouth daily. 01/01/22  Yes [place], Maricela Bo, FNP  apixaban (ELIQUIS) 2.5 MG TABS tablet  Take 5 mg by mouth 2 (two) times daily.   Yes [provider]  budesonide-formoterol (SYMBICORT) 80-4.5 MCG/ACT inhaler Inhale 2 puffs into the lungs in the morning and at bedtime. Patient taking differently: Inhale 2 puffs into the lungs See admin instructions. 2 puffs every morning, [place] take an additional 2 puffs later in the day if needed for wheezing, shortness of breath. 11/15/22  Yes Dgayli, Berdine Addison, MD  Cholecalciferol (VITAMIN D-3 PO) Take 1 capsule by mouth daily.   Yes [provider]  clonazePAM (KLONOPIN) 0.5 MG tablet Take 1 tablet by mouth twice daily as needed for anxiety Patient taking differently: Take 0.5 mg by mouth See admin instructions. 0.5 mg every night at bedtime, 0.5 mg during the morning/day as needed for anxiety. 10/08/22  Yes [name], MD  cloNIDine (CATAPRES) 0.1 MG tablet Take 1 tablet by mouth twice daily 10/13/22  Yes Joette Catching, PA-C  famotidine (PEPCID) 20 MG tablet Take 20 mg by mouth at bedtime as needed for heartburn.   Yes [provider]  ferrous sulfate 325 (65 FE) MG tablet Take 1 tablet (325 mg total) by mouth daily with breakfast. 07/04/20  Yes [name], MD  fluticasone (FLONASE) 50 MCG/ACT nasal spray Place 1 spray into both nostrils daily as needed for allergies or rhinitis.   Yes [provider]  losartan (COZAAR) 100 MG tablet Take 1 tablet by mouth once daily 09/24/22  Yes Bensimhon, Shaune Pascal, MD  metolazone (ZAROXOLYN) 2.5 MG tablet Take 1 tablet (2.5 mg total) by mouth once a week. Take every Thursday 11/10/22  Yes Larey Dresser, MD  nitroGLYCERIN (NITROSTAT) 0.4 MG SL tablet DISSOLVE ONE TABLET UNDER THE TONGUE EVERY 5 MINUTES AS NEEDED FOR CHEST PAIN.  DO NOT EXCEED A TOTAL OF 3 DOSES IN 15 MINUTES Patient taking differently: Place 0.4 mg under the tongue every 5 (five) minutes x 3 doses as needed for chest pain. 11/06/21  Yes Turner, Eber Hong, MD  ondansetron (ZOFRAN-ODT) 4 MG disintegrating tablet Take 1 tablet (4 mg total) by mouth every 8 (eight) hours as needed for nausea or vomiting. 09/01/22  Yes Isla Pence, MD  oxyCODONE (ROXICODONE) 5 MG immediate release tablet Take 1 tablet (5 mg total) by mouth every 6 (six) hours as needed for up to 5 days for severe pain. 11/17/22 11/22/22 Yes Howard, Amber S, FNP  potassium chloride (KLOR-CON) 10 MEQ tablet Take 2 tablets (20 mEq total) by mouth once a week. Take every Thursday Patient taking differently: Take 20 mEq by mouth every Thursday. Take with metolazone on Thursdays. 11/10/22  Yes Larey Dresser, MD  ranolazine (RANEXA) 500 MG 12 hr tablet Take 500 mg by mouth daily as needed (chest pain).   Yes [provider]  spironolactone (ALDACTONE) 25 MG tablet Take 1 tablet by mouth once daily 09/10/22  Yes Turner, Traci R, MD  torsemide (DEMADEX) 20 MG tablet Take 80 mg by mouth 2 (two) times daily.   Yes [provider]  apixaban (ELIQUIS) 5 MG TABS tablet Take 1 tablet (5 mg total) by mouth 2 (two) times daily. Patient not taking: Reported on 11/18/2022 11/10/22   Larey Dresser, MD  levocetirizine (XYZAL) 5 MG tablet TAKE 1 TABLET BY MOUTH ONCE DAILY IN THE  EVENING Patient not taking: Reported on 11/18/2022 11/18/22   [name], MD     Family History  Problem Relation Age of Onset   Heart disease Mother  Heart attack Mother    Cancer Sister        stomach and colon   Heart disease Brother 37   Hypertension Father    Cancer Sister    Stroke Neg Hx     Social History   Socioeconomic History   Marital status: Widowed    Spouse name: Not on file   Number of children: 1   Years of education: 62   Highest education level: 12th grade  Occupational History   Not on file  Tobacco Use   Smoking status: Never    Passive exposure: Never   Smokeless tobacco: Never  Vaping Use   Vaping Use: Never used  Substance and Sexual Activity   Alcohol use: No   Drug use: No   Sexual activity: Not Currently    Partners: Male  Other Topics Concern   Not on file  Social History Narrative   Husband Braulio Conte, passed away in [place] 14, 2023. Pt's son, Sherlon Handing lives beside of patient and helps with her care.    Social Determinants of Health   Financial Resource Strain: Low Risk  (11/08/2022)   Overall Financial Resource Strain (CARDIA)    Difficulty of Paying Living Expenses: Not very hard  Recent Concern: Financial Resource Strain - Medium Risk (11/04/2022)   Overall Financial Resource Strain (CARDIA)    Difficulty of Paying Living Expenses: Somewhat hard  Food Insecurity: No Food Insecurity (11/08/2022)   Hunger Vital Sign    Worried About Running Out of Food in the Last Year: Never true    Ran Out of Food in the Last Year: Never true  Transportation Needs: No Transportation Needs (11/08/2022)   PRAPARE - Hydrologist (Medical): No    Lack of Transportation (Non-Medical): No  Physical Activity: Sufficiently Active (11/08/2022)   Exercise Vital Sign    Days of Exercise per Week: 5 days    Minutes of Exercise per Session: 30 min  Recent Concern: Physical Activity - Insufficiently Active (08/11/2022)   Exercise  Vital Sign    Days of Exercise per Week: 5 days    Minutes of Exercise per Session: 10 min  Stress: No Stress Concern Present (11/08/2022)   [place]    Feeling of Stress : Only a little  Social Connections: Moderately Integrated (11/08/2022)   Social Connection and Isolation Panel [NHANES]    Frequency of Communication with Friends and Family: More than three times a week    Frequency of Social Gatherings with Friends and Family: More than three times a week    Attends Religious Services: More than 4 times per year    Active Member of [employer] or Organizations: Yes    Attends Archivist Meetings: More than 4 times per year    Marital Status: Widowed  Recent Concern: Social Connections - Moderately Isolated (08/11/2022)   Social Connection and Isolation Panel [NHANES]    Frequency of Communication with Friends and Family: More than three times a week    Frequency of Social Gatherings with Friends and Family: Twice a week    Attends Religious Services: More than 4 times per year    Active Member of [employer] or Organizations: No    Attends Archivist Meetings: Never    Marital Status: Widowed     Review of Systems: A 12 point ROS discussed and pertinent positives are indicated in the HPI above.  All other systems are  negative.  Vital Signs: BP (!) 154/61 (BP Location: Left Arm)   Pulse (!) 57   Temp 98.4 F (36.9 C) (Oral)   Resp 20   Ht 5\' 5"  (1.651 m)   Wt 173 lb 11.6 oz (78.8 kg)   SpO2 91%   BMI 28.91 kg/m    Physical Exam Vitals reviewed.  Constitutional:      General: She is not in acute distress.    Appearance: She is not ill-appearing.  HENT:     Head: Normocephalic.     Mouth/Throat:     Mouth: Mucous membranes are moist.     Pharynx: Oropharynx is clear.  Cardiovascular:     Rate and Rhythm: Normal rate and regular rhythm.     Heart sounds: Normal heart sounds.  Pulmonary:      Effort: Pulmonary effort is normal.     Breath sounds: Normal breath sounds. No wheezing.  Abdominal:     General: Abdomen is flat. Bowel sounds are normal.     Palpations: Abdomen is soft.  Musculoskeletal:        General: Tenderness present.     Cervical back: Neck supple.     Comments: TTP in lower back  Skin:    General: Skin is warm and dry.     Coloration: Skin is not jaundiced or pale.  Neurological:     Mental Status: She is alert and oriented to person, place, and time.  Psychiatric:        Mood and Affect: Mood normal.        Behavior: Behavior normal.        Judgment: Judgment normal.     MD Evaluation Airway: WNL Heart: WNL Abdomen: WNL Chest/ Lungs: WNL ASA  Classification: 3 Mallampati/Airway Score: Two  Imaging: MR LUMBAR SPINE WO CONTRAST  Result Date: 11/18/2022 CLINICAL DATA:  Back pain.  Lumbar compression fracture. EXAM: MRI LUMBAR SPINE WITHOUT CONTRAST TECHNIQUE: Multiplanar, multisequence MR imaging of the lumbar spine was performed. No intravenous contrast was administered. COMPARISON:  CT lumbar spine 11/17/2022. Abdominopelvic CT 09/01/2022 and 07/16/2018. FINDINGS: Segmentation: Conventional anatomy assumed, with the last open disc space designated L5-S1.Concordant with prior imaging. Alignment: Stable chronic retrolisthesis and mild focal kyphosis at L1-2. Vertebrae: As seen on recent lumbar spine CT, there is a subacute inferior endplate compression fracture at L3 resulting in approximately 20% loss of vertebral body height and no osseous retropulsion. There is mild marrow edema inferiorly in the vertebral body. There is also a mild inferior endplate compression fracture at L4 which results in approximately 10% loss of vertebral body height. There is also associated with mild marrow edema. Chronic unchanged inferior endplate compression deformity and Schmorl's node at L1. No suspicious osseous lesions. Conus medullaris: Extends to the T12-L1 level and  appears normal. Paraspinal and other soft tissues: No significant paraspinal findings. Disc levels: L1-2: Chronic degenerative disc disease with loss of disc height, annular disc bulging and endplate osteophytes asymmetric to the right. Chronic mild right lateral recess and right foraminal narrowing without nerve root encroachment. L2-3: Chronic spondylosis with loss of disc height, disc bulging and endplate osteophytes asymmetric to the left. Mild narrowing of the left lateral recess and left foramen without nerve root encroachment. L3-4: Chronic loss of disc height with annular disc bulging, endplate osteophytes, facet and ligamentous hypertrophy. Resulting mild multifactorial spinal stenosis with mild lateral recess and foraminal narrowing bilaterally. L4-5: Disc height and hydration are maintained. There is mild disc bulging and moderate facet/ligamentous hypertrophy.  Resulting moderate multifactorial spinal stenosis with mild lateral recess and foraminal narrowing bilaterally. L5-S1: Chronic loss of disc height with annular disc bulging and circumferential endplate osteophytes. Mild bilateral facet hypertrophy. Chronic mild right-greater-than-left foraminal narrowing. The spinal canal and lateral recesses are patent. IMPRESSION: 1. Subacute inferior endplate compression fractures at L3 and L4 with mild associated marrow edema. No osseous retropulsion. 2. Stable chronic inferior endplate compression deformity at L1. 3. Multilevel spondylosis with resulting mild multifactorial spinal stenosis at L3-4 and moderate spinal stenosis at L4-5. No high-grade foraminal narrowing or definite nerve root encroachment. Electronically Signed   By: Richardean Sale M.D.   On: 11/18/2022 16:57   DG Chest 1 View  Result Date: 11/18/2022 CLINICAL DATA:  Shortness of breath. EXAM: CHEST  1 VIEW COMPARISON:  10/29/2022 FINDINGS: The heart is mildly enlarged but stable. Stable tortuosity and calcification of the thoracic aorta.  Mild chronic bronchitic type lung changes but no infiltrates, edema or effusions. No pulmonary lesions or pneumothorax. The bony thorax is intact. IMPRESSION: Mild cardiac enlargement and chronic lung changes but no acute pulmonary findings. Electronically Signed   By: Marijo Sanes M.D.   On: 11/18/2022 08:59   DG Hip Unilat With Pelvis 2-3 Views Right  Result Date: 11/18/2022 CLINICAL DATA:  Hip pain.  Known L3 compression fracture. EXAM: DG HIP (WITH OR WITHOUT PELVIS) 3V RIGHT COMPARISON:  Lumbar CT from yesterday FINDINGS: Generalized osteopenia. Both hips are located. No evidence of fracture or diastasis. IMPRESSION: No acute finding. Electronically Signed   By: Jorje Guild M.D.   On: 11/18/2022 06:53   CT Lumbar Spine Wo Contrast  Result Date: 11/17/2022 CLINICAL DATA:  Mid to low back and bilateral sacroiliac joint pain. EXAM: CT LUMBAR SPINE WITHOUT CONTRAST TECHNIQUE: Multidetector CT imaging of the lumbar spine was performed without intravenous contrast administration. Multiplanar CT image reconstructions were also generated. RADIATION DOSE REDUCTION: This exam was performed according to the departmental dose-optimization program which includes automated exposure control, adjustment of the mA and/or kV according to patient size and/or use of iterative reconstruction technique. COMPARISON:  CT abdomen pelvis dated September 01, 2022. FINDINGS: Segmentation: 5 lumbar type vertebrae. Alignment: Unchanged focal kyphosis at L1 with mild retrolisthesis at L1-L2. Vertebrae: New acute to subacute L3 inferior endplate compression fracture with up to 35% height loss. No retropulsion. Chronic L1 inferior endplate compression deformity. Osteopenia. Paraspinal and other soft tissues: Aortoiliac atherosclerotic vascular disease. Old granulomatous disease in the spleen. Disc levels: T12-L1:  Mild disc bulging.  No stenosis. L1-L2:  Mild retropulsion endplate spurring.  No stenosis. L2-L3: Minimal disc bulging  and posterior endplate spurring. No stenosis. L3-L4: Mild disc bulging. Mild spinal canal and left neuroforaminal stenosis. No right neuroforaminal stenosis. L4-L5: Mild disc bulging and bilateral facet arthropathy. Prominent ligamentum flavum hypertrophy. Moderate spinal canal and mild right neuroforaminal stenosis. No left neuroforaminal stenosis. L5-S1: Severe disc height loss with small circumferential disc osteophyte complex and mild bilateral facet arthropathy. No stenosis. IMPRESSION: 1. New acute to subacute L3 inferior endplate compression fracture with up to 35% height loss. No retropulsion. 2. Chronic L1 inferior endplate compression deformity. 3. Multilevel lumbar spondylosis as described above. Moderate spinal canal stenosis at L4-L5. 4.  Aortic Atherosclerosis (ICD10-I70.0). Electronically Signed   By: Titus Dubin M.D.   On: 11/17/2022 15:14   DG Chest 2 View  Result Date: 10/29/2022 CLINICAL DATA:  Chest pain EXAM: CHEST - 2 VIEW COMPARISON:  Chest x-ray 03/16/2022 x-ray and CT angiogram FINDINGS: Enlarged cardiopericardial silhouette  with calcified tortuous aorta. Hyperinflation. Diffuse interstitial changes are again noted and likely chronic. No pneumothorax, effusion or consolidation. Osteopenia. Degenerative changes IMPRESSION: Hyperinflation with likely chronic interstitial changes. Enlarged cardiopericardial silhouette Electronically Signed   By: Jill Side M.D.   On: 10/29/2022 13:03    Labs:  CBC: Recent Labs    10/29/22 1244 11/17/22 1151 11/18/22 0635 11/19/22 0253  WBC 5.2 5.1 5.7 5.3  HGB 10.3* 10.4* 10.2* 9.4*  HCT 33.8* 32.0* 30.0* 27.8*  PLT 226 229 200 177    COAGS: Recent Labs    03/15/22 1213 03/16/22 0658  INR 1.4*  --   APTT  --  43*    BMP: Recent Labs    11/18/22 0635 11/18/22 1350 11/18/22 2046 11/19/22 0253 11/19/22 0718 11/19/22 1238  NA 121* 119* 120* 118* 121* 122*  K 3.8 4.3 4.1 4.1  --   --   CL 83* 81* 83* 84*  --   --   CO2  26 29 29 27   --   --   GLUCOSE 106* 122* 140* 114*  --   --   BUN 44* 42* 43* 46*  --   --   CALCIUM 8.7* 9.0 8.7* 8.5*  --   --   CREATININE 1.21* 1.29* 1.39* 1.33*  --   --   GFRNONAA 44* 41* 37* 39*  --   --     LIVER FUNCTION TESTS: Recent Labs    03/15/22 1213 08/20/22 1159 08/31/22 1508 09/01/22 1214 09/10/22 1220 10/29/22 1244 11/17/22 1151 11/18/22 1350 11/18/22 2046 11/19/22 0253  BILITOT 0.6   < > 1.4* 0.8 0.3 0.9 0.6  --   --   --   AST 20   < > 20 15 10 18 14   --   --   --   ALT 11   < > 10 10 9 11 7   --   --   --   ALKPHOS 68  --  82 71  --  89  --   --   --   --   PROT 7.2   < > 8.1 8.0 7.4 7.3 6.9  --   --   --   ALBUMIN 3.7  --  4.5 4.4  --  3.9  --  3.7 3.3* 3.1*   < > = values in this interval not displayed.    TUMOR MARKERS: No results for input(s): "AFPTM", "CEA", "CA199", "CHROMGRNA" in the last 8760 hours.  Assessment and Plan: 88 y.o. female with acute, symptomatic L3 and L4 compression fx who presents for KP.   VSS CBC WBC 5.3; hgb 9.4; plt 177 BMP sodium 118 today, patient with chronic hyponatremia; K 4.1; Ca 8.5; BUN 46, creatinine 1.33, GFR 39 - stable   Risks and benefits of L3 and L4 were discussed with the patient including, but not limited to education regarding the natural healing process of compression fractures without intervention, bleeding, infection, cement migration which [place] cause spinal cord damage, paralysis, pulmonary embolism or even death.  This interventional procedure involves the use of X-rays and because of the nature of the planned procedure, it is possible that we will have prolonged use of X-ray fluoroscopy.  Potential radiation risks to you include (but are not limited to) the following: - A slightly elevated risk for cancer  several years later in life. This risk is typically less than 0.5% percent. This risk is low in comparison to the normal incidence of human cancer, which  is 33% for women and 50% for men according  to the [place]. - Radiation induced injury can include skin redness, resembling a rash, tissue breakdown / ulcers and hair loss (which can be temporary or permanent).   The likelihood of either of these occurring depends on the difficulty of the procedure and whether you are sensitive to radiation due to previous procedures, disease, or genetic conditions.   IF your procedure requires a prolonged use of radiation, you will be notified and given written instructions for further action.  It is your responsibility to monitor the irradiated area for the 2 weeks following the procedure and to notify your physician if you are concerned that you have suffered a radiation induced injury.    All of the patient's questions were answered, patient is agreeable to proceed.  Consent signed and in chart.   The procedure is tentatively scheduled for Monday 3/18 pending NIR schedule.   PLAN - NPO Mon MN - CBC/INR/BMP on Monday AM - Eliquis to be held by primary team starting on Sat, can be resumed 24 hrs after the procedure  - If DVT prophylaxis is desired, patient can be on Lovenox will Sunday 8 am (24 hr hold) or sq heparin till Monday 2 am (6 hr hold.)   -Patient allergic to contrast. Contras usage is rare for KP, discussed with Dr. Estanislado Pandy, will administer IV Benadryl 25 mg 20 min prior to the procedure in IR.  - Ancef 2 g to be give in IR during the procedure.    Thank you for this interesting consult.  I greatly enjoyed meeting [name] and look forward to participating in their care.  A copy of this report was sent to the requesting provider on this date.  Electronically Signed: Tera Mater, PA-C 11/19/2022, 2:49 PM   I spent a total of 20 Minutes    in face to face in clinical consultation, greater than 50% of which was counseling/coordinating care for L3 and L4 KP.   This chart was dictated using voice recognition software.  Despite best efforts to proofread,  errors can  occur which can change the documentation meaning.

## 2022-11-20 DIAGNOSIS — I4819 Other persistent atrial fibrillation: Secondary | ICD-10-CM | POA: Diagnosis not present

## 2022-11-20 DIAGNOSIS — K5900 Constipation, unspecified: Secondary | ICD-10-CM | POA: Diagnosis not present

## 2022-11-20 DIAGNOSIS — E871 Hypo-osmolality and hyponatremia: Secondary | ICD-10-CM | POA: Diagnosis not present

## 2022-11-20 DIAGNOSIS — I5032 Chronic diastolic (congestive) heart failure: Secondary | ICD-10-CM | POA: Diagnosis not present

## 2022-11-20 LAB — RENAL FUNCTION PANEL
Albumin: 2.8 g/dL — ABNORMAL LOW (ref 3.5–5.0)
Anion gap: 9 (ref 5–15)
BUN: 37 mg/dL — ABNORMAL HIGH (ref 8–23)
CO2: 24 mmol/L (ref 22–32)
Calcium: 8.7 mg/dL — ABNORMAL LOW (ref 8.9–10.3)
Chloride: 93 mmol/L — ABNORMAL LOW (ref 98–111)
Creatinine, Ser: 1.05 mg/dL — ABNORMAL HIGH (ref 0.44–1.00)
GFR, Estimated: 52 mL/min — ABNORMAL LOW (ref >60)
Glucose, Bld: 92 mg/dL (ref 70–99)
Phosphorus: 2.6 mg/dL (ref 2.5–4.6)
Potassium: 4.4 mmol/L (ref 3.5–5.1)
Sodium: 126 mmol/L — ABNORMAL LOW (ref 135–145)

## 2022-11-20 LAB — CBC
HCT: 27.2 % — ABNORMAL LOW (ref 36.0–46.0)
Hemoglobin: 9.1 g/dL — ABNORMAL LOW (ref 12.0–15.0)
MCH: 31.2 pg (ref 26.0–34.0)
MCHC: 33.5 g/dL (ref 30.0–36.0)
MCV: 93.2 fL (ref 80.0–100.0)
Platelets: 169 10*3/uL (ref 150–400)
RBC: 2.92 MIL/uL — ABNORMAL LOW (ref 3.87–5.11)
RDW: 13.1 % (ref 11.5–15.5)
WBC: 5.7 10*3/uL (ref 4.0–10.5)
nRBC: 0 % (ref 0.0–0.2)

## 2022-11-20 LAB — MAGNESIUM: Magnesium: 2.2 mg/dL (ref 1.7–2.4)

## 2022-11-20 LAB — HEPARIN LEVEL (UNFRACTIONATED): Heparin Unfractionated: >1.1 IU/mL — ABNORMAL HIGH (ref 0.30–0.70)

## 2022-11-20 LAB — APTT: aPTT: 92 seconds — ABNORMAL HIGH (ref 24–36)

## 2022-11-20 LAB — SODIUM: Sodium: 125 mmol/L — ABNORMAL LOW (ref 135–145)

## 2022-11-20 MED ORDER — ONDANSETRON HCL 4 MG/2ML IJ SOLN
4.0000 mg | Freq: Three times a day (TID) | INTRAMUSCULAR | Status: DC | PRN
  Administered 2022-11-20: 4 mg via INTRAVENOUS
  Filled 2022-11-20: qty 2

## 2022-11-20 MED ORDER — SENNOSIDES-DOCUSATE SODIUM 8.6-50 MG PO TABS
2.0000 | ORAL_TABLET | Freq: Two times a day (BID) | ORAL | Status: AC
  Administered 2022-11-20 (×2): 2 via ORAL
  Filled 2022-11-20 (×2): qty 2

## 2022-11-20 MED ORDER — SENNOSIDES-DOCUSATE SODIUM 8.6-50 MG PO TABS
1.0000 | ORAL_TABLET | Freq: Two times a day (BID) | ORAL | Status: DC | PRN
  Administered 2022-11-23: 1 via ORAL
  Filled 2022-11-20: qty 1

## 2022-11-20 MED ORDER — POLYETHYLENE GLYCOL 3350 17 G PO PACK: 17.0000 g | PACK | Freq: Two times a day (BID) | ORAL | Status: DC | PRN

## 2022-11-20 NOTE — Progress Notes (Addendum)
PROGRESS NOTE  [name] [id number] DOB: 02-27-1935   PCP: [name], MD  Patient is from: Home. Lives alone. Uses cane at times.   DOA: 11/18/2022 LOS: 1  Chief complaints Chief Complaint  Patient presents with   Back Pain     Brief Narrative / Interim history: 88 y.o. female PMH of diastolic CHF, [id number], A-fib on Eliquis, COPD, CAD, left breast cancer, colon cancer, GERD and anxiety presenting with low back pain and right hip pain for 2 days.  Seen by PCP the day prior to presentation and had a CT that showed L3 inferior endplate compression fracture with 35% height loss but no retropulsion, and multilevel lumbar spondylosis and moderate spinal canal stenosis at L4-L5.  She was advised to go to the hospital but did not come until her pain was unbearable the next day.    In ED, she was hyponatremic to 121 (baseline 130s).  Pelvic x-ray without acute finding.  Initially, concern about hypervolemic hyponatremia and she was given IV Lasix.  However, creatinine and hyponatremia gotten worse.  She was started on gentle IV fluid.  IR consulted for kyphoplasty and recommended holding Eliquis starting 3/16.  MRI lumbar spine confirms CT finding.   Subjective: Seen and examined earlier this morning.  No major events overnight of this morning.  Continues to endorse significant back pain.  Pain gets to 10-12 on a scale of 10 with movement but improved to 6-7 when she is at rest.  Denies chest pain, shortness of breath, GI or UTI symptoms other than constipation.  Hyponatremia improved.  Objective: Vitals:   11/20/22 0041 11/20/22 0446 11/20/22 0700 11/20/22 1120  BP: 127/62 133/60 (!) 150/78 (!) 146/74  Pulse: (!) 51 (!) 51 (!) 55 (!) 52  Resp: 20 20 (!) 21 20  Temp: 97.6 F (36.4 C) 97.7 F (36.5 C) (!) 94.4 F (34.7 C)   TempSrc: Oral Oral Oral   SpO2: 95% 91% 91% 91%  Weight: 79.6 kg     Height:        Examination:  GENERAL: No apparent distress.  Nontoxic. HEENT: MMM.   Vision and hearing grossly intact.  NECK: Supple.  No apparent JVD.  RESP:  No IWOB.  Fair aeration bilaterally. CVS:  RRR.  2/6 SEM over RUSB. ABD/GI/GU: BS+. Abd soft, NTND.  MSK/EXT:   No apparent deformity. Moves extremities.  Trace BLE edema. SKIN: no apparent skin lesion or wound NEURO: Awake and alert. Oriented appropriately.  No apparent focal neuro deficit other than subtle weakness in RLE related to pain. PSYCH: Calm. Normal affect.   Procedures:  None  Microbiology summarized: None  Assessment and plan: Principal Problem:   Hyponatremia Active Problems:   Persistent atrial fibrillation (HCC)   Hypertension   COPD (chronic obstructive pulmonary disease) (HCC)   Coronary artery disease   Chronic diastolic CHF (congestive heart failure) (HCC)   Chronic kidney disease, stage 3b (HCC)   Closed compression fracture of L3 lumbar vertebra, initial encounter (HCC)   Lower back pain   Normocytic anemia  Hyponatremia: Likely due to metolazone, Aldactone and losartan.  Baseline about 130. Recent Labs  Lab 11/17/22 1151 11/18/22 0635 11/18/22 1350 11/18/22 2046 11/19/22 0253 11/19/22 0718 11/19/22 1238 11/19/22 2003 11/20/22 0100 11/20/22 0706  NA 120* 121* 119* 120* 118* 121* 122* 123* 125* 126*  -Discontinue IV fluid  -Continue p.o. sodium chloride 3 times daily -Recheck sodium in the morning -Continue holding diuretics and losartan   Acute lower  back pain due to acute close L3 compression fracture: No reported trauma, fall or heavy lifting. CT showed L3 compression fracture with 35% height loss but no retropulsion.  MRI confirmed this.  Vitamin D level 25.11. -Pain control with scheduled Tylenol, as needed oxy and Dilaudid -Possible kyphoplasty next week pending insurance approval -P.o. vitamin D -PT/OT   Chronic diastolic CHF: Patient has chronic dyspnea and BLE edema unchanged from baseline.  No orthopnea or PND.  Overall, she appears euvolemic on exam.  CXR  without vascular congestion or edema.  BNP only 136.  TTE in 03/2022 with LVEF of 60 to 65%, RVSP of 50 mmHg.   -Continue holding diuretics -Discontinue IV fluid -Closely monitor respiratory status and fluid status while on IV fluid -Strict intake and output, daily weight, renal functions and electrolytes   Chronic COPD: Stable. Ruthe Mannan instead of home Symbicort -DuoNebs as needed   History of CAD: No chest pain.  Seems to take Ranexa as needed at home -Holding losartan as above.   Persistent atrial fibrillation: Rate controlled without medications.  On low-dose Eliquis for anticoagulation -Change Eliquis to heparin for possible kyphoplasty.   Essential hypertension: BP slightly elevated. -Continue home clonidine -Hold home torsemide, Aldactone and losartan.   CKD-3B: Cr seems to be better than baseline. Recent Labs    09/30/22 1635 10/15/22 1216 10/29/22 1244 11/10/22 1459 11/17/22 1151 11/18/22 0635 11/18/22 1350 11/18/22 2046 11/19/22 0253 11/20/22 0706  BUN 63* 71* 30* 42* 52* 44* 42* 43* 46* 37*  CREATININE 1.41* 1.59* 1.07* 1.44* 1.45* 1.21* 1.29* 1.39* 1.33* 1.05*  -Continue holding diuretics and losartan as above. -Continue monitoring  Normocytic iron deficiency anemia: Slight drop in Hgb likely dilutional. Recent Labs    08/31/22 1508 09/01/22 1214 09/10/22 1220 09/20/22 1547 09/28/22 0902 10/29/22 1244 11/17/22 1151 11/18/22 0635 11/19/22 0253 11/20/22 0100  HGB 11.3* 11.2* 10.3* 10.3* 10.7* 10.3* 10.4* 10.2* 9.4* 9.1*  -IV ferric gluconate 250 mg daily x 2 -Vitamin B12 is 270.  Will give vitamin B12 injection followed by p.o.  Constipation -Bowel regimen ordered.   Goal of care counseling/ACP: Full code.   -See discussion on 3/14.  Body mass index is 29.2 kg/m.          DVT prophylaxis:  On full dose anticoagulation  Code Status: Full code Family Communication: None at bedside. Level of care: Telemetry Cardiac Status is:  Inpatient The patient will remain inpatient because: Hyponatremia and acute lumbar spine fracture   Final disposition: SNF Consultants:  Interventional radiology  35 minutes with more than 50% spent in reviewing records, counseling patient/family and coordinating care.   Sch Meds:  Scheduled Meds:  acetaminophen  1,000 mg Oral Q8H   cholecalciferol  1,000 Units Oral Daily   cloNIDine  0.1 mg Oral BID   vitamin B-12  1,000 mcg Oral Daily   mometasone-formoterol  2 puff Inhalation BID   senna-docusate  2 tablet Oral BID   sodium chloride  1 g Oral TID WC   Continuous Infusions:  ferric gluconate (FERRLECIT) IVPB 250 mg (11/20/22 1027)   heparin 950 Units/hr (11/20/22 1114)   PRN Meds:.clonazePAM, famotidine, guaiFENesin-dextromethorphan, HYDROmorphone (DILAUDID) injection, ipratropium-albuterol, oxyCODONE, phenol, polyethylene glycol, senna-docusate **FOLLOWED BY** [START ON 11/21/2022] senna-docusate  Antimicrobials: Anti-infectives (From admission, onward)    None        I have personally reviewed the following labs and images: CBC: Recent Labs  Lab 11/17/22 1151 11/18/22 0635 11/19/22 0253 11/20/22 0100  WBC 5.1 5.7 5.3 5.7  NEUTROABS 3,259 3.8  --   --   HGB 10.4* 10.2* 9.4* 9.1*  HCT 32.0* 30.0* 27.8* 27.2*  MCV 92.5 91.7 92.1 93.2  PLT 229 200 177 169   BMP &GFR Recent Labs  Lab 11/18/22 0635 11/18/22 1350 11/18/22 2046 11/19/22 0253 11/19/22 0718 11/19/22 1238 11/19/22 2003 11/20/22 0100 11/20/22 0706  NA 121* 119* 120* 118* 121* 122* 123* 125* 126*  K 3.8 4.3 4.1 4.1  --   --   --   --  4.4  CL 83* 81* 83* 84*  --   --   --   --  93*  CO2 26 29 29 27   --   --   --   --  24  GLUCOSE 106* 122* 140* 114*  --   --   --   --  92  BUN 44* 42* 43* 46*  --   --   --   --  37*  CREATININE 1.21* 1.29* 1.39* 1.33*  --   --   --   --  1.05*  CALCIUM 8.7* 9.0 8.7* 8.5*  --   --   --   --  8.7*  MG  --  2.0  --  2.1  --   --   --  2.2  --   PHOS  --  3.1  3.5 3.5  --   --   --   --  2.6   Estimated Creatinine Clearance: 40.8 mL/min (A) (by C-G formula based on SCr of 1.05 mg/dL (H)). Liver & Pancreas: Recent Labs  Lab 11/17/22 1151 11/18/22 1350 11/18/22 2046 11/19/22 0253 11/20/22 0706  AST 14  --   --   --   --   ALT 7  --   --   --   --   BILITOT 0.6  --   --   --   --   PROT 6.9  --   --   --   --   ALBUMIN  --  3.7 3.3* 3.1* 2.8*   No results for input(s): "LIPASE", "AMYLASE" in the last 168 hours. No results for input(s): "AMMONIA" in the last 168 hours. Diabetic: No results for input(s): "HGBA1C" in the last 72 hours. No results for input(s): "GLUCAP" in the last 168 hours. Cardiac Enzymes: No results for input(s): "CKTOTAL", "CKMB", "CKMBINDEX", "TROPONINI" in the last 168 hours. No results for input(s): "PROBNP" in the last 8760 hours. Coagulation Profile: No results for input(s): "INR", "PROTIME" in the last 168 hours. Thyroid Function Tests: Recent Labs    11/18/22 1350  TSH 2.024   Lipid Profile: No results for input(s): "CHOL", "HDL", "LDLCALC", "TRIG", "CHOLHDL", "LDLDIRECT" in the last 72 hours. Anemia Panel: Recent Labs    11/19/22 0253  VITAMINB12 270  FOLATE 10.1  FERRITIN 70  TIBC 322  IRON 32  RETICCTPCT 1.7   Urine analysis:    Component Value Date/Time   COLORURINE STRAW (A) 11/18/2022 1006   APPEARANCEUR CLEAR 11/18/2022 1006   LABSPEC 1.006 11/18/2022 1006   PHURINE 5.0 11/18/2022 1006   GLUCOSEU NEGATIVE 11/18/2022 1006   HGBUR NEGATIVE 11/18/2022 1006   BILIRUBINUR NEGATIVE 11/18/2022 1006   KETONESUR NEGATIVE 11/18/2022 1006   PROTEINUR NEGATIVE 11/18/2022 1006   UROBILINOGEN 0.2 11/04/2013 1321   NITRITE NEGATIVE 11/18/2022 1006   LEUKOCYTESUR NEGATIVE 11/18/2022 1006   Sepsis Labs: Invalid input(s): "PROCALCITONIN", "LACTICIDVEN"  Microbiology: No results found for this or any previous visit (from the past 240 hour(s)).  Radiology Studies: No results found.    [name]  T. [place]  If 7PM-7AM, please contact night-coverage [email or web address] 11/20/2022, 12:23 PM

## 2022-11-20 NOTE — Progress Notes (Signed)
[place] for switch apixaban to IV heparin 3/16  Indication: atrial fibrillation  Allergies  Allergen Reactions   Contrast Media [Iodinated Contrast Media] Hives and Other (See Comments)    Chest pain and burning    Clonidine Derivatives Other (See Comments)    Dry mouth, throat   Statins Other (See Comments)    Myalgias   Sulfa Antibiotics Diarrhea    Tremors    Celebrex [Celecoxib] Rash   Isordil [Isosorbide Nitrate] Itching and Rash   Tape Itching and Rash    Redness at application site Reaction is to paper and plastic tapes and EKG leads    Patient Measurements: Height: 5\' 5"  (165.1 cm) Weight: 79.6 kg (175 lb 7.8 oz) IBW/kg (Calculated) : 57 Heparin Dosing Weight: ~ 78 kg  Vital Signs: Temp: 98.2 F (36.8 C) (03/16 1926) Temp Source: Oral (03/16 1926) BP: 139/45 (03/16 1926) Pulse Rate: 56 (03/16 1926)  Labs: Recent Labs    11/18/22 0635 11/18/22 1350 11/18/22 2046 11/19/22 0253 11/20/22 0100 11/20/22 0706 11/20/22 1746  HGB 10.2*  --   --  9.4* 9.1*  --   --   HCT 30.0*  --   --  27.8* 27.2*  --   --   PLT 200  --   --  177 169  --   --   APTT  --   --   --   --   --   --  92*  CREATININE 1.21*   < > 1.39* 1.33*  --  1.05*  --    < > = values in this interval not displayed.     Estimated Creatinine Clearance: 40.8 mL/min (A) (by C-G formula based on SCr of 1.05 mg/dL (H)).   Medical History: Past Medical History:  Diagnosis Date   Allergy    rhinitis   Aortic stenosis    mild by echo 06/2017   Arthritis    Bradycardia    a. 10/2017 -> beta blocker cut back due to HR 39.   Breast cancer ([place]) 01/06/2012   Cancer ([place])    right colon and left breast   Chronic diastolic CHF (congestive heart failure) ([place])    Colon cancer ([place]) 01/06/2012   Colovesical fistula    Dr. Marlou Starks and [name] planning surgery 08/2018- surgery revealed spontaneous closure   COPD (chronic obstructive pulmonary disease) ([place])    pt.  denies   Coronary artery disease 2006   a.  NSTEMI in 2016, cath showed 15% prox-mid RCA, 20% prox LAD, EF 25-35% by cath and 35-40% -> felt due to Takotsubo cardiomyopathy.   Dilated aortic root ([place])    52mmHg by echo 06/2017   Diverticulosis    Dyspnea    Edema extremities    GERD (gastroesophageal reflux disease)    Hernia    Hiatal hernia    denies   Hyperlipidemia    Hypertension    Mild aortic stenosis    echo 11/2015 but not noted on echo 06/2016   Osteopenia    Permanent atrial fibrillation (HCC)    chronic atrial fibrillation   Pneumonia    hx child   Pulmonary HTN ([place])    a. moderate to severe PASP 46mmHg echo 11/2015 - now 28mmHg by echo 06/2017. CTA chest in 11/16 with no PE. PFTs in 7/15 with mild obstructive lung disease. She had a negative sleep study in 2017. b. Felt due to left sided HF.   Stroke [facility])  Takotsubo syndrome 07/29/2015   a. EF 35-40% by echo; akinesis of mid-apical anteroseptal and apical myocardium.  EF now normalized on echo 11/2015    Medications:  Infusions:   heparin 950 Units/hr (11/20/22 1114)    Assessment: 88 yo female on chronic Eliquis for afib now with need for L3 and L4 kyphoplasty.  Pharmacy asked dose IV heparin tomorrow in anticipation of OR next week. -aPTT at goal on heparin 950 units/hr  Goal of Therapy:  aPTT 66-102 Heparin level 0.3-0.7 units/ml Monitor platelets by anticoagulation protocol: Yes   Plan:  -Continue heparin 950 units/hr -Heparin level, aPTT and CBC in am  Hildred Laser, PharmD Clinical Pharmacist **Pharmacist phone directory can now be found on [email or web address] ([email or web address]).  Listed under [place].

## 2022-11-21 DIAGNOSIS — K5900 Constipation, unspecified: Secondary | ICD-10-CM | POA: Diagnosis not present

## 2022-11-21 DIAGNOSIS — I5032 Chronic diastolic (congestive) heart failure: Secondary | ICD-10-CM | POA: Diagnosis not present

## 2022-11-21 DIAGNOSIS — I4819 Other persistent atrial fibrillation: Secondary | ICD-10-CM | POA: Diagnosis not present

## 2022-11-21 DIAGNOSIS — E669 Obesity, unspecified: Secondary | ICD-10-CM

## 2022-11-21 DIAGNOSIS — E871 Hypo-osmolality and hyponatremia: Secondary | ICD-10-CM | POA: Diagnosis not present

## 2022-11-21 LAB — HEPARIN LEVEL (UNFRACTIONATED): Heparin Unfractionated: >1.1 IU/mL — ABNORMAL HIGH (ref 0.30–0.70)

## 2022-11-21 LAB — APTT: aPTT: 102 seconds — ABNORMAL HIGH (ref 24–36)

## 2022-11-21 LAB — CBC
HCT: 27.2 % — ABNORMAL LOW (ref 36.0–46.0)
Hemoglobin: 8.8 g/dL — ABNORMAL LOW (ref 12.0–15.0)
MCH: 30.9 pg (ref 26.0–34.0)
MCHC: 32.4 g/dL (ref 30.0–36.0)
MCV: 95.4 fL (ref 80.0–100.0)
Platelets: 154 10*3/uL (ref 150–400)
RBC: 2.85 MIL/uL — ABNORMAL LOW (ref 3.87–5.11)
RDW: 13.2 % (ref 11.5–15.5)
WBC: 6.6 10*3/uL (ref 4.0–10.5)
nRBC: 0 % (ref 0.0–0.2)

## 2022-11-21 LAB — RENAL FUNCTION PANEL
Albumin: 2.7 g/dL — ABNORMAL LOW (ref 3.5–5.0)
Anion gap: 6 (ref 5–15)
BUN: 34 mg/dL — ABNORMAL HIGH (ref 8–23)
CO2: 24 mmol/L (ref 22–32)
Calcium: 8.7 mg/dL — ABNORMAL LOW (ref 8.9–10.3)
Chloride: 98 mmol/L (ref 98–111)
Creatinine, Ser: 0.95 mg/dL (ref 0.44–1.00)
GFR, Estimated: 59 mL/min — ABNORMAL LOW (ref >60)
Glucose, Bld: 106 mg/dL — ABNORMAL HIGH (ref 70–99)
Phosphorus: 2.8 mg/dL (ref 2.5–4.6)
Potassium: 4.8 mmol/L (ref 3.5–5.1)
Sodium: 128 mmol/L — ABNORMAL LOW (ref 135–145)

## 2022-11-21 LAB — MAGNESIUM: Magnesium: 2.3 mg/dL (ref 1.7–2.4)

## 2022-11-21 MED ORDER — HYDRALAZINE HCL 25 MG PO TABS: 25.0000 mg | ORAL_TABLET | Freq: Four times a day (QID) | ORAL | Status: DC | PRN

## 2022-11-21 MED ORDER — TORSEMIDE 20 MG PO TABS
40.0000 mg | ORAL_TABLET | Freq: Two times a day (BID) | ORAL | Status: DC
  Administered 2022-11-21 – 2022-11-22 (×4): 40 mg via ORAL
  Filled 2022-11-21 (×4): qty 2

## 2022-11-21 MED ORDER — LOSARTAN POTASSIUM 50 MG PO TABS
50.0000 mg | ORAL_TABLET | Freq: Every day | ORAL | Status: DC
  Administered 2022-11-21 – 2022-11-24 (×4): 50 mg via ORAL
  Filled 2022-11-21 (×4): qty 1

## 2022-11-21 NOTE — Progress Notes (Signed)
Lab tech called this nurse to pt's room, stating that pt had hit her arm on bed rail and sustained two skin tears. Wounds were assessed, cleansed, and each site covered with 3" mepilex.

## 2022-11-21 NOTE — Progress Notes (Signed)
PROGRESS NOTE  [name] [id number] DOB: 05-Aug-1935   PCP: [name], MD  Patient is from: Home. Lives alone. Uses cane at times.   DOA: 11/18/2022 LOS: 2  Chief complaints Chief Complaint  Patient presents with   Back Pain     Brief Narrative / Interim history: 88 y.o. female PMH of diastolic CHF, [id number], A-fib on Eliquis, COPD, CAD, left breast cancer, colon cancer, GERD and anxiety presenting with low back pain and right hip pain for 2 days.  Seen by PCP the day prior to presentation and had a CT that showed L3 inferior endplate compression fracture with 35% height loss but no retropulsion, and multilevel lumbar spondylosis and moderate spinal canal stenosis at L4-L5.  She was advised to go to the hospital but did not come until her pain was unbearable the next day.    In ED, she was hyponatremic to 121 (baseline 130s).  Pelvic x-ray without acute finding.  Initially, concern about hypervolemic hyponatremia and she was given IV Lasix.  However, creatinine and hyponatremia gotten worse.  She was started on gentle IV fluid.  IR consulted for kyphoplasty and recommended holding Eliquis starting 3/16.  MRI lumbar spine confirms CT finding.   Subjective: Seen and examined earlier this morning.  No major events overnight of this morning.  Continues to endorse back and right hip pain although she does not appear to be in much distress lying in bed.  Pain is worse with movement.  No shortness of breath and chest pain.  Had a small bowel movements.  Sodium and creatinine improved.  Hgb dropped a little bit.  She denies melena or hematochezia.  She had a small right arm laceration after she bumped her arms to bedrail.  Objective: Vitals:   11/21/22 0430 11/21/22 0720 11/21/22 0810 11/21/22 1204  BP:  (!) 154/46  (!) 161/57  Pulse:  (!) 50  68  Resp:  19  20  Temp:  97.7 F (36.5 C)    TempSrc:  Oral    SpO2:  93% 90% 93%  Weight: 81.9 kg     Height:         Examination:  GENERAL: No apparent distress.  Nontoxic. HEENT: MMM.  Vision and hearing grossly intact.  NECK: Supple.  No apparent JVD.  RESP:  No IWOB.  Fair aeration bilaterally. CVS:  RRR.  2/6 SEM over RUSB. ABD/GI/GU: BS+. Abd soft, NTND.  MSK/EXT:   No apparent deformity. Moves extremities.  Trace BLE edema. SKIN: Scattered skin bruises in bilateral arms. NEURO: Awake and alert. Oriented appropriately.  No apparent focal neuro deficit. PSYCH: Calm. Normal affect.   Procedures:  None  Microbiology summarized: None  Assessment and plan: Principal Problem:   Hyponatremia Active Problems:   Persistent atrial fibrillation (HCC)   Hypertension   COPD (chronic obstructive pulmonary disease) (HCC)   Coronary artery disease   Chronic diastolic CHF (congestive heart failure) (HCC)   Chronic kidney disease, stage 3b (HCC)   Closed compression fracture of L3 lumbar vertebra, initial encounter (HCC)   Lower back pain   Normocytic anemia  Hyponatremia: Likely due to metolazone, Aldactone and losartan.  Baseline about 130. Recent Labs  Lab 11/18/22 0635 11/18/22 1350 11/18/22 2046 11/19/22 0253 11/19/22 0718 11/19/22 1238 11/19/22 2003 11/20/22 0100 11/20/22 0706 11/21/22 0025  NA 121* 119* 120* 118* 121* 122* 123* 125* 126* 128*  -Continue p.o. sodium chloride 3 times daily -Recheck sodium in the morning -Resume home torsemide at  half dose.  Continue holding losartan, Aldactone and metolazone   Acute lower back pain due to acute close L3 compression fracture: No reported trauma, fall or heavy lifting. CT showed L3 compression fracture with 35% height loss but no retropulsion.  MRI confirmed this.  Vitamin D level 25.11. -Pain control with scheduled Tylenol, as needed oxy and Dilaudid -Possible kyphoplasty.  She is n.p.o. after midnight -Aggressive bowel regimen -P.o. vitamin D -PT/OT   Chronic diastolic CHF: Patient has chronic dyspnea and BLE edema unchanged  from baseline.  No orthopnea or PND.  Overall, she appears euvolemic on exam.  CXR without vascular congestion or edema.  BNP only 136.  TTE in 03/2022 with LVEF of 60 to 65%, RVSP of 50 mmHg.  Excellent urine output, 1.3 L / 24 hours.  Net -4 L. -Resume home torsemide at half dose (40 mg twice daily). -Continue holding metolazone, Aldactone and losartan. -Closely monitor respiratory status and fluid status while on IV fluid -Strict intake and output, daily weight, renal functions and electrolytes   Chronic COPD: Stable. Ruthe Mannan instead of home Symbicort -DuoNebs as needed   History of CAD: No chest pain.  Seems to take Ranexa as needed at home -Holding losartan as above.   Persistent atrial fibrillation: Rate controlled without medications.  On low-dose Eliquis for anticoagulation -On IV heparin instead of home Eliquis pending kyphoplasty   Essential hypertension: BP slightly elevated. -Continue home clonidine -Diuretics as above. -Continue holding losartan   CKD-3B: Cr way better than baseline Recent Labs    10/15/22 1216 10/29/22 1244 11/10/22 1459 11/17/22 1151 11/18/22 0635 11/18/22 1350 11/18/22 2046 11/19/22 0253 11/20/22 0706 11/21/22 0025  BUN 71* 30* 42* 52* 44* 42* 43* 46* 37* 34*  CREATININE 1.59* 1.07* 1.44* 1.45* 1.21* 1.29* 1.39* 1.33* 1.05* 0.95  -Continue monitoring  Normocytic iron deficiency anemia: Slight drop in Hgb likely dilutional.  No overt bleeding. Recent Labs    09/01/22 1214 09/10/22 1220 09/20/22 1547 09/28/22 0902 10/29/22 1244 11/17/22 1151 11/18/22 0635 11/19/22 0253 11/20/22 0100 11/21/22 0025  HGB 11.2* 10.3* 10.3* 10.7* 10.3* 10.4* 10.2* 9.4* 9.1* 8.8*  -Received IV ferric gluconate 250 mg daily x 2 -Vitamin B12 is 270.  Received vitamin B12 injection.  Continue p.o.   Constipation -Bowel regimen ordered.   Goal of care counseling/ACP: Full code.   -See discussion on 3/14.  Obesity Body mass index is 30.05 kg/m.           DVT prophylaxis:  On full dose anticoagulation  Code Status: Full code Family Communication: None at bedside. Level of care: Telemetry Cardiac Status is: Inpatient The patient will remain inpatient because: Hyponatremia and acute lumbar spine fracture   Final disposition: SNF Consultants:  Interventional radiology  35 minutes with more than 50% spent in reviewing records, counseling patient/family and coordinating care.   Sch Meds:  Scheduled Meds:  acetaminophen  1,000 mg Oral Q8H   cholecalciferol  1,000 Units Oral Daily   cloNIDine  0.1 mg Oral BID   vitamin B-12  1,000 mcg Oral Daily   mometasone-formoterol  2 puff Inhalation BID   sodium chloride  1 g Oral TID WC   torsemide  40 mg Oral BID   Continuous Infusions:  heparin 950 Units/hr (11/20/22 1114)   PRN Meds:.clonazePAM, famotidine, guaiFENesin-dextromethorphan, HYDROmorphone (DILAUDID) injection, ipratropium-albuterol, ondansetron (ZOFRAN) IV, oxyCODONE, phenol, polyethylene glycol, [COMPLETED] senna-docusate **FOLLOWED BY** senna-docusate  Antimicrobials: Anti-infectives (From admission, onward)    None  I have personally reviewed the following labs and images: CBC: Recent Labs  Lab 11/17/22 1151 11/18/22 0635 11/19/22 0253 11/20/22 0100 11/21/22 0025  WBC 5.1 5.7 5.3 5.7 6.6  NEUTROABS 3,259 3.8  --   --   --   HGB 10.4* 10.2* 9.4* 9.1* 8.8*  HCT 32.0* 30.0* 27.8* 27.2* 27.2*  MCV 92.5 91.7 92.1 93.2 95.4  PLT 229 200 177 169 154   BMP &GFR Recent Labs  Lab 11/18/22 1350 11/18/22 2046 11/19/22 0253 11/19/22 0718 11/19/22 1238 11/19/22 2003 11/20/22 0100 11/20/22 0706 11/21/22 0025  NA 119* 120* 118*   < > 122* 123* 125* 126* 128*  K 4.3 4.1 4.1  --   --   --   --  4.4 4.8  CL 81* 83* 84*  --   --   --   --  93* 98  CO2 29 29 27   --   --   --   --  24 24  GLUCOSE 122* 140* 114*  --   --   --   --  92 106*  BUN 42* 43* 46*  --   --   --   --  37* 34*  CREATININE 1.29*  1.39* 1.33*  --   --   --   --  1.05* 0.95  CALCIUM 9.0 8.7* 8.5*  --   --   --   --  8.7* 8.7*  MG 2.0  --  2.1  --   --   --  2.2  --  2.3  PHOS 3.1 3.5 3.5  --   --   --   --  2.6 2.8   < > = values in this interval not displayed.   Estimated Creatinine Clearance: 45.8 mL/min (by C-G formula based on SCr of 0.95 mg/dL). Liver & Pancreas: Recent Labs  Lab 11/17/22 1151 11/18/22 1350 11/18/22 2046 11/19/22 0253 11/20/22 0706 11/21/22 0025  AST 14  --   --   --   --   --   ALT 7  --   --   --   --   --   BILITOT 0.6  --   --   --   --   --   PROT 6.9  --   --   --   --   --   ALBUMIN  --  3.7 3.3* 3.1* 2.8* 2.7*   No results for input(s): "LIPASE", "AMYLASE" in the last 168 hours. No results for input(s): "AMMONIA" in the last 168 hours. Diabetic: No results for input(s): "HGBA1C" in the last 72 hours. No results for input(s): "GLUCAP" in the last 168 hours. Cardiac Enzymes: No results for input(s): "CKTOTAL", "CKMB", "CKMBINDEX", "TROPONINI" in the last 168 hours. No results for input(s): "PROBNP" in the last 8760 hours. Coagulation Profile: No results for input(s): "INR", "PROTIME" in the last 168 hours. Thyroid Function Tests: Recent Labs    11/18/22 1350  TSH 2.024   Lipid Profile: No results for input(s): "CHOL", "HDL", "LDLCALC", "TRIG", "CHOLHDL", "LDLDIRECT" in the last 72 hours. Anemia Panel: Recent Labs    11/19/22 0253  VITAMINB12 270  FOLATE 10.1  FERRITIN 70  TIBC 322  IRON 32  RETICCTPCT 1.7   Urine analysis:    Component Value Date/Time   COLORURINE STRAW (A) 11/18/2022 1006   APPEARANCEUR CLEAR 11/18/2022 1006   LABSPEC 1.006 11/18/2022 1006   PHURINE 5.0 11/18/2022 1006   GLUCOSEU NEGATIVE 11/18/2022 1006   HGBUR NEGATIVE  11/18/2022 [place] 11/18/2022 [place] 11/18/2022 1006   PROTEINUR NEGATIVE 11/18/2022 1006   UROBILINOGEN 0.2 11/04/2013 1321   NITRITE NEGATIVE 11/18/2022 1006   LEUKOCYTESUR  NEGATIVE 11/18/2022 1006   Sepsis Labs: Invalid input(s): "PROCALCITONIN", "LACTICIDVEN"  Microbiology: No results found for this or any previous visit (from the past 240 hour(s)).  Radiology Studies: No results found.    Mc Hollen T. Harding  If 7PM-7AM, please contact night-coverage [email or web address] 11/21/2022, 12:22 PM

## 2022-11-21 NOTE — Progress Notes (Signed)
[place] for switch apixaban to IV heparin 3/16  Indication: atrial fibrillation  Allergies  Allergen Reactions   Contrast Media [Iodinated Contrast Media] Hives and Other (See Comments)    Chest pain and burning    Clonidine Derivatives Other (See Comments)    Dry mouth, throat   Statins Other (See Comments)    Myalgias   Sulfa Antibiotics Diarrhea    Tremors    Celebrex [Celecoxib] Rash   Isordil [Isosorbide Nitrate] Itching and Rash   Tape Itching and Rash    Redness at application site Reaction is to paper and plastic tapes and EKG leads    Patient Measurements: Height: 5\' 5"  (165.1 cm) Weight: 81.9 kg (180 lb 8.9 oz) IBW/kg (Calculated) : 57 Heparin Dosing Weight: ~ 78 kg  Vital Signs: Temp: 97.7 F (36.5 C) (03/17 0720) Temp Source: Oral (03/17 0720) BP: 154/46 (03/17 0720) Pulse Rate: 50 (03/17 0720)  Labs: Recent Labs    11/19/22 0253 11/20/22 0100 11/20/22 0706 11/20/22 1746 11/21/22 0025  HGB 9.4* 9.1*  --   --  8.8*  HCT 27.8* 27.2*  --   --  27.2*  PLT 177 169  --   --  154  APTT  --   --   --  92* 102*  HEPARINUNFRC  --   --   --  >1.10* >1.10*  CREATININE 1.33*  --  1.05*  --  0.95     Estimated Creatinine Clearance: 45.8 mL/min (by C-G formula based on SCr of 0.95 mg/dL).   Medical History: Past Medical History:  Diagnosis Date   Allergy    rhinitis   Aortic stenosis    mild by echo 06/2017   Arthritis    Bradycardia    a. 10/2017 -> beta blocker cut back due to HR 39.   Breast cancer ([place]) 01/06/2012   Cancer ([place])    right colon and left breast   Chronic diastolic CHF (congestive heart failure) ([place])    Colon cancer ([place]) 01/06/2012   Colovesical fistula    Dr. Marlou Starks and [name] planning surgery 08/2018- surgery revealed spontaneous closure   COPD (chronic obstructive pulmonary disease) ([place])    pt. denies   Coronary artery disease 2006   a.  NSTEMI in 2016, cath showed 15% prox-mid RCA, 20%  prox LAD, EF 25-35% by cath and 35-40% -> felt due to Takotsubo cardiomyopathy.   Dilated aortic root ([place])    73mmHg by echo 06/2017   Diverticulosis    Dyspnea    Edema extremities    GERD (gastroesophageal reflux disease)    Hernia    Hiatal hernia    denies   Hyperlipidemia    Hypertension    Mild aortic stenosis    echo 11/2015 but not noted on echo 06/2016   Osteopenia    Permanent atrial fibrillation (HCC)    chronic atrial fibrillation   Pneumonia    hx child   Pulmonary HTN ([place])    a. moderate to severe PASP 58mmHg echo 11/2015 - now 58mmHg by echo 06/2017. CTA chest in 11/16 with no PE. PFTs in 7/15 with mild obstructive lung disease. She had a negative sleep study in 2017. b. Felt due to left sided HF.   Stroke [facility])    Takotsubo syndrome 07/29/2015   a. EF 35-40% by echo; akinesis of mid-apical anteroseptal and apical myocardium.  EF now normalized on echo 11/2015    Medications:  Infusions:  heparin 950 Units/hr (11/20/22 1114)    Assessment: 88 yo female on chronic Eliquis for afib now with need for L3 and L4 kyphoplasty.  Pharmacy asked dose IV heparin tomorrow in anticipation of OR next week.  Heparin level > 1.1 and aPTT 102 on drip rate 950 units/hr therapeutic. Second aPTT within range. Hgb 8.8 and plt 154. No s/sx bleeding noted in chart. Will give slight dose adjust to heparin to prevent levels from becoming supratherapeutic.   Goal of Therapy:  aPTT 66-102 Heparin level 0.3-0.7 units/ml Monitor platelets by anticoagulation protocol: Yes   Plan:  Decrease heparin infusion to 900 units/hr  Monitor daily heparin level/aPTT and CBC Discontinue aPTTs when correlating with heparin level Continue to monitor H&H   F/u anticoagulation plan s/p kyphoplasty   Gena Fray, PharmD PGY1 Pharmacy Resident   11/21/2022 7:39 AM  **Pharmacist phone directory can now be found on [email or web address] ([email or web address]).  Listed under [place].

## 2022-11-22 ENCOUNTER — Other Ambulatory Visit (HOSPITAL_COMMUNITY): Payer: Medicare Other

## 2022-11-22 ENCOUNTER — Inpatient Hospital Stay (HOSPITAL_COMMUNITY): Payer: Medicare Other

## 2022-11-22 DIAGNOSIS — E875 Hyperkalemia: Secondary | ICD-10-CM

## 2022-11-22 DIAGNOSIS — E871 Hypo-osmolality and hyponatremia: Secondary | ICD-10-CM | POA: Diagnosis not present

## 2022-11-22 DIAGNOSIS — K5900 Constipation, unspecified: Secondary | ICD-10-CM | POA: Diagnosis not present

## 2022-11-22 DIAGNOSIS — I5032 Chronic diastolic (congestive) heart failure: Secondary | ICD-10-CM | POA: Diagnosis not present

## 2022-11-22 DIAGNOSIS — I4819 Other persistent atrial fibrillation: Secondary | ICD-10-CM | POA: Diagnosis not present

## 2022-11-22 HISTORY — PX: IR KYPHO LUMBAR INC FX REDUCE BONE BX UNI/BIL CANNULATION INC/IMAGING: IMG5519

## 2022-11-22 HISTORY — PX: IR KYPHO EA ADDL LEVEL THORACIC OR LUMBAR: IMG5520

## 2022-11-22 LAB — APTT: aPTT: 84 seconds — ABNORMAL HIGH (ref 24–36)

## 2022-11-22 LAB — CBC WITH DIFFERENTIAL/PLATELET
Abs Immature Granulocytes: 0.03 10*3/uL (ref 0.00–0.07)
Basophils Absolute: 0 10*3/uL (ref 0.0–0.1)
Basophils Relative: 0 %
Eosinophils Absolute: 0.1 10*3/uL (ref 0.0–0.5)
Eosinophils Relative: 3 %
HCT: 26.7 % — ABNORMAL LOW (ref 36.0–46.0)
Hemoglobin: 8.5 g/dL — ABNORMAL LOW (ref 12.0–15.0)
Immature Granulocytes: 1 %
Lymphocytes Relative: 34 %
Lymphs Abs: 1.8 10*3/uL (ref 0.7–4.0)
MCH: 31.1 pg (ref 26.0–34.0)
MCHC: 31.8 g/dL (ref 30.0–36.0)
MCV: 97.8 fL (ref 80.0–100.0)
Monocytes Absolute: 0.8 10*3/uL (ref 0.1–1.0)
Monocytes Relative: 14 %
Neutro Abs: 2.6 10*3/uL (ref 1.7–7.7)
Neutrophils Relative %: 48 %
Platelets: 168 10*3/uL (ref 150–400)
RBC: 2.73 MIL/uL — ABNORMAL LOW (ref 3.87–5.11)
RDW: 13.3 % (ref 11.5–15.5)
WBC: 5.4 10*3/uL (ref 4.0–10.5)
nRBC: 0 % (ref 0.0–0.2)

## 2022-11-22 LAB — PROTIME-INR
INR: 1.3 — ABNORMAL HIGH (ref 0.8–1.2)
Prothrombin Time: 16.4 seconds — ABNORMAL HIGH (ref 11.4–15.2)

## 2022-11-22 LAB — RENAL FUNCTION PANEL
Albumin: 2.6 g/dL — ABNORMAL LOW (ref 3.5–5.0)
Anion gap: 9 (ref 5–15)
BUN: 39 mg/dL — ABNORMAL HIGH (ref 8–23)
CO2: 26 mmol/L (ref 22–32)
Calcium: 8.8 mg/dL — ABNORMAL LOW (ref 8.9–10.3)
Chloride: 94 mmol/L — ABNORMAL LOW (ref 98–111)
Creatinine, Ser: 1.27 mg/dL — ABNORMAL HIGH (ref 0.44–1.00)
GFR, Estimated: 41 mL/min — ABNORMAL LOW (ref >60)
Glucose, Bld: 106 mg/dL — ABNORMAL HIGH (ref 70–99)
Phosphorus: 3.2 mg/dL (ref 2.5–4.6)
Potassium: 5.2 mmol/L — ABNORMAL HIGH (ref 3.5–5.1)
Sodium: 129 mmol/L — ABNORMAL LOW (ref 135–145)

## 2022-11-22 LAB — HEPARIN LEVEL (UNFRACTIONATED): Heparin Unfractionated: >1.1 IU/mL — ABNORMAL HIGH (ref 0.30–0.70)

## 2022-11-22 LAB — MAGNESIUM: Magnesium: 2.1 mg/dL (ref 1.7–2.4)

## 2022-11-22 MED ORDER — CEFAZOLIN SODIUM-DEXTROSE 2-4 GM/100ML-% IV SOLN
INTRAVENOUS | Status: AC
  Filled 2022-11-22: qty 100

## 2022-11-22 MED ORDER — SODIUM CHLORIDE 0.9 % IV SOLN: INTRAVENOUS | Status: AC

## 2022-11-22 MED ORDER — BUPIVACAINE HCL (PF) 0.5 % IJ SOLN
INTRAMUSCULAR | Status: AC
  Administered 2022-11-22: 15 mL
  Filled 2022-11-22: qty 30

## 2022-11-22 MED ORDER — CEFAZOLIN SODIUM-DEXTROSE 2-4 GM/100ML-% IV SOLN
2.0000 g | INTRAVENOUS | Status: AC
  Administered 2022-11-22: 2 g via INTRAVENOUS
  Filled 2022-11-22: qty 100

## 2022-11-22 MED ORDER — HEPARIN (PORCINE) 25000 UT/250ML-% IV SOLN
900.0000 [IU]/h | INTRAVENOUS | Status: AC
  Administered 2022-11-22 – 2022-11-23 (×2): 900 [IU]/h via INTRAVENOUS
  Filled 2022-11-22: qty 250

## 2022-11-22 MED ORDER — MIDAZOLAM HCL 2 MG/2ML IJ SOLN
INTRAMUSCULAR | Status: AC | PRN
  Administered 2022-11-22 (×6): .5 mg via INTRAVENOUS

## 2022-11-22 MED ORDER — DIPHENHYDRAMINE HCL 50 MG/ML IJ SOLN
25.0000 mg | Freq: Once | INTRAMUSCULAR | Status: AC
  Administered 2022-11-22: 25 mg via INTRAVENOUS
  Filled 2022-11-22: qty 1

## 2022-11-22 MED ORDER — TOBRAMYCIN SULFATE 1.2 G IJ SOLR
INTRAMUSCULAR | Status: AC
  Filled 2022-11-22: qty 1.2

## 2022-11-22 MED ORDER — IOHEXOL 300 MG/ML  SOLN
50.0000 mL | Freq: Once | INTRAMUSCULAR | Status: AC | PRN
  Administered 2022-11-22: 30 mL

## 2022-11-22 MED ORDER — MIDAZOLAM HCL 2 MG/2ML IJ SOLN
INTRAMUSCULAR | Status: AC
  Filled 2022-11-22: qty 4

## 2022-11-22 MED ORDER — SODIUM ZIRCONIUM CYCLOSILICATE 10 G PO PACK
10.0000 g | PACK | Freq: Once | ORAL | Status: AC
  Administered 2022-11-22: 10 g via ORAL
  Filled 2022-11-22: qty 1

## 2022-11-22 MED ORDER — FENTANYL CITRATE (PF) 100 MCG/2ML IJ SOLN
INTRAMUSCULAR | Status: AC
  Filled 2022-11-22: qty 4

## 2022-11-22 MED ORDER — FENTANYL CITRATE (PF) 100 MCG/2ML IJ SOLN
INTRAMUSCULAR | Status: AC | PRN
  Administered 2022-11-22 (×5): 25 ug via INTRAVENOUS

## 2022-11-22 NOTE — Procedures (Signed)
INR.  Status post L3 and L4 balloon kyphoplasty with fluoroscopic guidance.  No acute complications.  Patient tolerated the procedure well.  Hemostasis obtained at the skin entry sites.  Arlean Hopping MD.

## 2022-11-22 NOTE — Progress Notes (Signed)
PROGRESS NOTE  [name] [id number] DOB: November 30, 1934   PCP: [name], MD  Patient is from: Home. Lives alone. Uses cane at times.   DOA: 11/18/2022 LOS: 3  Chief complaints Chief Complaint  Patient presents with   Back Pain     Brief Narrative / Interim history: 88 y.o. female PMH of diastolic CHF, [id number], A-fib on Eliquis, COPD, CAD, left breast cancer, colon cancer, GERD and anxiety presenting with low back pain and right hip pain for 2 days.  Seen by PCP the day prior to presentation and had a CT that showed L3 inferior endplate compression fracture with 35% height loss but no retropulsion, and multilevel lumbar spondylosis and moderate spinal canal stenosis at L4-L5.  She was advised to go to the hospital but did not come until her pain was unbearable the next day.    In ED, she was hyponatremic to 121 (baseline 130s).  Pelvic x-ray without acute finding.  Initially, concern about hypervolemic hyponatremia and she was given IV Lasix.  However, creatinine and hyponatremia gotten worse.  She was started on gentle IV fluid.  IR consulted for kyphoplasty and recommended holding Eliquis starting 3/16.  MRI lumbar spine confirms CT finding.  Patient had L3 and L4 balloon kyphoplasty on 3/18.   Subjective: Seen and examined this afternoon after she returned from kyphoplasty.  Reports significant improvement in her back pain.  No complaints.  Denies shortness of breath, chest pain, GI or UTI symptoms.  Objective: Vitals:   11/22/22 1125 11/22/22 1130 11/22/22 1135 11/22/22 1140  BP: (!) 149/47 (!) 169/57 (!) 175/56   Pulse: (!) 51 (!) 51 (!) 52 (!) 57  Resp: 13 17 16 16   Temp:      TempSrc:      SpO2: 100% 99% 99% 95%  Weight:      Height:        Examination:  GENERAL: No apparent distress.  Nontoxic. HEENT: MMM.  Vision and hearing grossly intact.  NECK: Supple.  No apparent JVD.  RESP:  No IWOB.  Fair aeration bilaterally. CVS:  RRR.  2/6 SEM over RUSB. ABD/GI/GU:  BS+. Abd soft, NTND.  MSK/EXT:   No apparent deformity. Moves extremities.  Trace BLE edema. SKIN: no apparent skin lesion or wound NEURO: Awake and alert. Oriented appropriately.  No apparent focal neuro deficit. PSYCH: Calm. Normal affect.   Procedures:  3/18-L3 and L4 balloon kyphoplasty  Microbiology summarized: None  Assessment and plan: Principal Problem:   Hyponatremia Active Problems:   Persistent atrial fibrillation (HCC)   Hypertension   COPD (chronic obstructive pulmonary disease) (HCC)   Coronary artery disease   Chronic diastolic CHF (congestive heart failure) (HCC)   Chronic kidney disease, stage 3b (HCC)   Closed compression fracture of L3 lumbar vertebra, initial encounter (HCC)   Lower back pain   Normocytic anemia  Hyponatremia: Likely due to metolazone, Aldactone and losartan.  Baseline about 130. Recent Labs  Lab 11/18/22 1350 11/18/22 2046 11/19/22 0253 11/19/22 0718 11/19/22 1238 11/19/22 2003 11/20/22 0100 11/20/22 0706 11/21/22 0025 11/22/22 0043  NA 119* 120* 118* 121* 122* 123* 125* 126* 128* 129*  -Discontinue p.o. sodium chloride. -Recheck sodium in the morning -Continue holding home Aldactone and metolazone   Acute lower back pain due to acute close L3 compression fracture: No reported trauma, fall or heavy lifting. CT showed L3 compression fracture with 35% height loss but no retropulsion.  MRI confirmed this.  Vitamin D level 25.11. -S/p L3  and L4 kyphoplasty on 3/8. -Pain control with scheduled Tylenol, as needed oxy and Dilaudid -Aggressive bowel regimen -P.o. vitamin D -PT/OT   Chronic diastolic CHF: Patient has chronic dyspnea and BLE edema unchanged from baseline.  No orthopnea or PND.  Overall, she appears euvolemic on exam.  CXR without vascular congestion or edema.  BNP only 136.  TTE in 03/2022 with LVEF of 60 to 65%, RVSP of 50 mmHg.  Excellent urine output and net -1.8 L.  SBP slightly elevated.  Cr up.  -Resumed home  torsemide at half dose (40 mg twice daily) on 3/17. -Resumed home losartan at half dose on 3/17 -Continue holding metolazone and Aldactone. -Strict intake and output, daily weight, renal functions and electrolytes   Chronic COPD: Stable. Ruthe Mannan instead of home Symbicort -DuoNebs as needed   History of CAD: No chest pain.  Seems to take Ranexa as needed at home -Holding losartan as above.   Persistent atrial fibrillation: Rate controlled without medications.  On low-dose Eliquis for anticoagulation -Continue IV heparin.  Resume Eliquis in the morning.   Essential hypertension: BP slightly elevated. -Continue home clonidine -Resumed home losartan at half dose. -Diuretics as above.   CKD-3B: Cr trended up after resuming torsemide and losartan although at half home dose. Recent Labs    10/29/22 1244 11/10/22 1459 11/17/22 1151 11/18/22 0635 11/18/22 1350 11/18/22 2046 11/19/22 0253 11/20/22 0706 11/21/22 0025 11/22/22 0043  BUN 30* 42* 52* 44* 42* 43* 46* 37* 34* 39*  CREATININE 1.07* 1.44* 1.45* 1.21* 1.29* 1.39* 1.33* 1.05* 0.95 1.27*  -Continue monitoring  Normocytic iron deficiency anemia: Slight drop in Hgb likely dilutional.  No overt bleeding. Recent Labs    09/10/22 1220 09/20/22 1547 09/28/22 0902 10/29/22 1244 11/17/22 1151 11/18/22 0635 11/19/22 0253 11/20/22 0100 11/21/22 0025 11/22/22 0043  HGB 10.3* 10.3* 10.7* 10.3* 10.4* 10.2* 9.4* 9.1* 8.8* 8.5*  -Received IV ferric gluconate 250 mg daily x 2 -Vitamin B12 is 270.  Received vitamin B12 injection.  Continue p.o.   Constipation -Bowel regimen ordered.   Goal of care counseling/ACP: Full code.   -See discussion on 3/14.  Mild hyperkalemia: -P.o. Lokelma 10 g x 1 -Recheck in the morning  Physical deconditioning -PT/OT.  Obesity Body mass index is 30.05 kg/m.          DVT prophylaxis:  On full dose anticoagulation  Code Status: Full code Family Communication: None at bedside.   Attempted to call patient's son for bedside phone but no answer. Level of care: Telemetry Cardiac Status is: Inpatient The patient will remain inpatient because: Hyponatremia and acute lumbar spine fracture   Final disposition: SNF Consultants:  Interventional radiology  35 minutes with more than 50% spent in reviewing records, counseling patient/family and coordinating care.   Sch Meds:  Scheduled Meds:  acetaminophen  1,000 mg Oral Q8H   cholecalciferol  1,000 Units Oral Daily   cloNIDine  0.1 mg Oral BID   vitamin B-12  1,000 mcg Oral Daily   losartan  50 mg Oral Daily   mometasone-formoterol  2 puff Inhalation BID   torsemide  40 mg Oral BID   Continuous Infusions:  sodium chloride 75 mL/hr at 11/22/22 1328   heparin Stopped (11/22/22 0859)   PRN Meds:.clonazePAM, famotidine, guaiFENesin-dextromethorphan, hydrALAZINE, HYDROmorphone (DILAUDID) injection, ipratropium-albuterol, ondansetron (ZOFRAN) IV, oxyCODONE, phenol, polyethylene glycol, [COMPLETED] senna-docusate **FOLLOWED BY** senna-docusate  Antimicrobials: Anti-infectives (From admission, onward)    Start     Dose/Rate Route Frequency Ordered Stop   11/22/22  1110  tobramycin (NEBCIN) 1.2 g powder       Note to Pharmacy: Carpentieri, Christi: cabinet override      11/22/22 1110 11/22/22 1229   11/22/22 1009  ceFAZolin (ANCEF) 2-4 GM/100ML-% IVPB  Status:  Discontinued       Note to Pharmacy: Margaretmary Dys E: cabinet override      11/22/22 1009 11/22/22 1139   11/22/22 0930  ceFAZolin (ANCEF) IVPB 2g/100 mL premix        2 g 200 mL/hr over 30 Minutes Intravenous To Radiology 11/22/22 0839 11/22/22 1038        I have personally reviewed the following labs and images: CBC: Recent Labs  Lab 11/17/22 1151 11/18/22 0635 11/19/22 0253 11/20/22 0100 11/21/22 0025 11/22/22 0043  WBC 5.1 5.7 5.3 5.7 6.6 5.4  NEUTROABS 3,259 3.8  --   --   --  2.6  HGB 10.4* 10.2* 9.4* 9.1* 8.8* 8.5*  HCT 32.0* 30.0* 27.8*  27.2* 27.2* 26.7*  MCV 92.5 91.7 92.1 93.2 95.4 97.8  PLT 229 200 177 169 154 168   BMP &GFR Recent Labs  Lab 11/18/22 1350 11/18/22 2046 11/19/22 0253 11/19/22 0718 11/19/22 2003 11/20/22 0100 11/20/22 0706 11/21/22 0025 11/22/22 0043  NA 119* 120* 118*   < > 123* 125* 126* 128* 129*  K 4.3 4.1 4.1  --   --   --  4.4 4.8 5.2*  CL 81* 83* 84*  --   --   --  93* 98 94*  CO2 29 29 27   --   --   --  24 24 26   GLUCOSE 122* 140* 114*  --   --   --  92 106* 106*  BUN 42* 43* 46*  --   --   --  37* 34* 39*  CREATININE 1.29* 1.39* 1.33*  --   --   --  1.05* 0.95 1.27*  CALCIUM 9.0 8.7* 8.5*  --   --   --  8.7* 8.7* 8.8*  MG 2.0  --  2.1  --   --  2.2  --  2.3 2.1  PHOS 3.1 3.5 3.5  --   --   --  2.6 2.8 3.2   < > = values in this interval not displayed.   Estimated Creatinine Clearance: 34.3 mL/min (A) (by C-G formula based on SCr of 1.27 mg/dL (H)). Liver & Pancreas: Recent Labs  Lab 11/17/22 1151 11/18/22 1350 11/18/22 2046 11/19/22 0253 11/20/22 0706 11/21/22 0025 11/22/22 0043  AST 14  --   --   --   --   --   --   ALT 7  --   --   --   --   --   --   BILITOT 0.6  --   --   --   --   --   --   PROT 6.9  --   --   --   --   --   --   ALBUMIN  --    < > 3.3* 3.1* 2.8* 2.7* 2.6*   < > = values in this interval not displayed.   No results for input(s): "LIPASE", "AMYLASE" in the last 168 hours. No results for input(s): "AMMONIA" in the last 168 hours. Diabetic: No results for input(s): "HGBA1C" in the last 72 hours. No results for input(s): "GLUCAP" in the last 168 hours. Cardiac Enzymes: No results for input(s): "CKTOTAL", "CKMB", "CKMBINDEX", "TROPONINI" in the last 168 hours. No  results for input(s): "PROBNP" in the last 8760 hours. Coagulation Profile: Recent Labs  Lab 11/22/22 0043  INR 1.3*   Thyroid Function Tests: No results for input(s): "TSH", "T4TOTAL", "FREET4", "T3FREE", "THYROIDAB" in the last 72 hours.  Lipid Profile: No results for input(s):  "CHOL", "HDL", "LDLCALC", "TRIG", "CHOLHDL", "LDLDIRECT" in the last 72 hours. Anemia Panel: No results for input(s): "VITAMINB12", "FOLATE", "FERRITIN", "TIBC", "IRON", "RETICCTPCT" in the last 72 hours.  Urine analysis:    Component Value Date/Time   COLORURINE STRAW (A) 11/18/2022 1006   APPEARANCEUR CLEAR 11/18/2022 1006   LABSPEC 1.006 11/18/2022 1006   PHURINE 5.0 11/18/2022 1006   GLUCOSEU NEGATIVE 11/18/2022 1006   HGBUR NEGATIVE 11/18/2022 1006   BILIRUBINUR NEGATIVE 11/18/2022 1006   KETONESUR NEGATIVE 11/18/2022 1006   PROTEINUR NEGATIVE 11/18/2022 1006   UROBILINOGEN 0.2 11/04/2013 1321   NITRITE NEGATIVE 11/18/2022 1006   LEUKOCYTESUR NEGATIVE 11/18/2022 1006   Sepsis Labs: Invalid input(s): "PROCALCITONIN", "LACTICIDVEN"  Microbiology: No results found for this or any previous visit (from the past 240 hour(s)).  Radiology Studies: No results found.    Ein Rijo T. Dry Prong  If 7PM-7AM, please contact night-coverage [email or web address] 11/22/2022, 2:29 PM

## 2022-11-22 NOTE — Progress Notes (Signed)
PT Cancellation Note  Patient Details Name: Jasey Shelton Berkemeier MRN: [id number] DOB: [birth year]   Cancelled Treatment:    Reason Eval/Treat Not Completed: Active bedrest order (Pt s/p procedure. Per MD orders active bed rest order for 3 hours post procedure.)   Viann Shove 11/22/2022, 1:25 PM

## 2022-11-22 NOTE — Care Management Important Message (Signed)
Important Message  Patient Details  Name: [name] MRN: [id number] Date of Birth: 11/06/1934   Medicare Important Message Given:  Yes     Shelda Altes 11/22/2022, 8:11 AM

## 2022-11-22 NOTE — TOC Progression Note (Signed)
Transition of Care [facility]) - Progression Note    Patient Details  Name: [name] MRN: [id number] Date of Birth: 1934/09/11  [facility]) CM/SW Contact  Bjorn Pippin, LCSW Phone Number: 11/22/2022, 4:03 PM  Clinical Narrative:    CSW met with pt at bedside with son present in room to deliver bed offer list. Pt is unable to make a decision based on current offers due to the location of the SNF's. Pt requested [facility]. CSW reached out to [facility] to learn of any update. Tiffany from [facility] said she would review referral and reach back out to CSW.   Pt and son expressed worries on timing of dc. CSW communicated their concerns with MD and provided him with son contact information to discuss further.   TOC will continue to follow this admission.    Expected Discharge Plan: [place] Barriers to Discharge: Continued Medical Work up  Expected Discharge Plan and Services       Living arrangements for the past 2 months: Single Family Home                                       Social Determinants of Health (SDOH) Interventions SDOH Screenings   Food Insecurity: No Food Insecurity (11/08/2022)  Housing: Low Risk  (11/08/2022)  Transportation Needs: No Transportation Needs (11/08/2022)  Utilities: Not At Risk (11/08/2022)  Alcohol Screen: Low Risk  (11/08/2022)  Depression (PHQ2-9): Low Risk  (11/08/2022)  Financial Resource Strain: Low Risk  (11/08/2022)  Recent Concern: Financial Resource Strain - Medium Risk (11/04/2022)  Physical Activity: Sufficiently Active (11/08/2022)  Recent Concern: Physical Activity - Insufficiently Active (08/11/2022)  Social Connections: Moderately Integrated (11/08/2022)  Recent Concern: Social Connections - Moderately Isolated (08/11/2022)  Stress: No Stress Concern Present (11/08/2022)  Tobacco Use: Low Risk  (11/19/2022)    Readmission Risk Interventions     No data to display         Beckey Rutter,  MSW, LCSWA, LCASA Transitions of Care  Clinical Social Worker I

## 2022-11-22 NOTE — Discharge Instructions (Signed)
INR.  1.  No stooping, bending or lifting weights above 10 pounds for 2 weeks.  2.  Use a walker to ambulate for 2 weeks.  3.  No driving for 2 weeks.  4.  Follow up as needed with referring MD in 2 weeks.

## 2022-11-22 NOTE — Progress Notes (Addendum)
OT Cancellation Note  Patient Details Name: [name] MRN: [id number] DOB: 08-31-1935   Cancelled Treatment:    Reason Eval/Treat Not Completed: Active bedrest order, pt on bedrest x3 hours post-procedure, will follow up this PM as appropriate and schedule permits.   Tresa Moore, OTR/L [facility] [phone] - [place] 11/22/2022, 12:20 PM

## 2022-11-22 NOTE — Progress Notes (Signed)
[place] for apixaban > IV heparin Indication: atrial fibrillation  Allergies  Allergen Reactions   Contrast Media [Iodinated Contrast Media] Hives and Other (See Comments)    Chest pain and burning    Clonidine Derivatives Other (See Comments)    Dry mouth, throat   Statins Other (See Comments)    Myalgias   Sulfa Antibiotics Diarrhea    Tremors    Celebrex [Celecoxib] Rash   Isordil [Isosorbide Nitrate] Itching and Rash   Tape Itching and Rash    Redness at application site Reaction is to paper and plastic tapes and EKG leads    Patient Measurements: Height: 5\' 5"  (165.1 cm) Weight: 81.9 kg (180 lb 8.9 oz) IBW/kg (Calculated) : 57 Heparin Dosing Weight: 74 kg  Vital Signs: Temp: 97.8 F (36.6 C) (03/18 0711) Temp Source: Oral (03/18 0711) BP: 146/42 (03/18 0711) Pulse Rate: 52 (03/18 0711)  Labs: Recent Labs    11/20/22 0100 11/20/22 0706 11/20/22 1746 11/21/22 0025 11/22/22 0043  HGB 9.1*  --   --  8.8* 8.5*  HCT 27.2*  --   --  27.2* 26.7*  PLT 169  --   --  154 168  APTT  --   --  92* 102* 84*  LABPROT  --   --   --   --  16.4*  INR  --   --   --   --  1.3*  HEPARINUNFRC  --   --  >1.10* >1.10* >1.10*  CREATININE  --  1.05*  --  0.95 1.27*    Estimated Creatinine Clearance: 34.3 mL/min (A) (by C-G formula based on SCr of 1.27 mg/dL (H)).   Medical History: Past Medical History:  Diagnosis Date   Allergy    rhinitis   Aortic stenosis    mild by echo 06/2017   Arthritis    Bradycardia    a. 10/2017 -> beta blocker cut back due to HR 39.   Breast cancer ([place]) 01/06/2012   Cancer ([place])    right colon and left breast   Chronic diastolic CHF (congestive heart failure) ([place])    Colon cancer ([place]) 01/06/2012   Colovesical fistula    Dr. Marlou Starks and [name] planning surgery 08/2018- surgery revealed spontaneous closure   COPD (chronic obstructive pulmonary disease) ([place])    pt. denies   Coronary artery disease  2006   a.  NSTEMI in 2016, cath showed 15% prox-mid RCA, 20% prox LAD, EF 25-35% by cath and 35-40% -> felt due to Takotsubo cardiomyopathy.   Dilated aortic root ([place])    38mmHg by echo 06/2017   Diverticulosis    Dyspnea    Edema extremities    GERD (gastroesophageal reflux disease)    Hernia    Hiatal hernia    denies   Hyperlipidemia    Hypertension    Mild aortic stenosis    echo 11/2015 but not noted on echo 06/2016   Osteopenia    Permanent atrial fibrillation (HCC)    chronic atrial fibrillation   Pneumonia    hx child   Pulmonary HTN ([place])    a. moderate to severe PASP 12mmHg echo 11/2015 - now 73mmHg by echo 06/2017. CTA chest in 11/16 with no PE. PFTs in 7/15 with mild obstructive lung disease. She had a negative sleep study in 2017. b. Felt due to left sided HF.   Stroke [facility])    Takotsubo syndrome 07/29/2015   a. EF 35-40%  by echo; akinesis of mid-apical anteroseptal and apical myocardium.  EF now normalized on echo 11/2015   [name]: 88 yo female on chronic Eliquis for afib now with need for L3 and L4 kyphoplasty.  Pharmacy asked dose IV heparin tomorrow in anticipation of OR next week.  Heparin level still falsely elevated due to recent apixban at > 1.1. aPTT 84 on heparin 900 units/hr therapeutic. Hgb 8.5 and plt 168 stable. No issues with heparin infusion or s/sx bleeding reported.  Goal of Therapy:  aPTT 66-102 Heparin level 0.3-0.7 units/ml Monitor platelets by anticoagulation protocol: Yes   Plan:  Continue heparin infusion at 900 units/hr Check heparin level daily while on heparin Continue to monitor H&H and platelets  Thank you for allowing pharmacy to be a part of this patient's care.  Ardyth Harps, PharmD Clinical Pharmacist

## 2022-11-23 ENCOUNTER — Ambulatory Visit: Payer: Self-pay

## 2022-11-23 DIAGNOSIS — E871 Hypo-osmolality and hyponatremia: Secondary | ICD-10-CM | POA: Diagnosis not present

## 2022-11-23 DIAGNOSIS — K5900 Constipation, unspecified: Secondary | ICD-10-CM | POA: Diagnosis not present

## 2022-11-23 DIAGNOSIS — I4819 Other persistent atrial fibrillation: Secondary | ICD-10-CM | POA: Diagnosis not present

## 2022-11-23 DIAGNOSIS — I5032 Chronic diastolic (congestive) heart failure: Secondary | ICD-10-CM | POA: Diagnosis not present

## 2022-11-23 LAB — RENAL FUNCTION PANEL
Albumin: 2.7 g/dL — ABNORMAL LOW (ref 3.5–5.0)
Anion gap: 11 (ref 5–15)
BUN: 38 mg/dL — ABNORMAL HIGH (ref 8–23)
CO2: 25 mmol/L (ref 22–32)
Calcium: 9.1 mg/dL (ref 8.9–10.3)
Chloride: 94 mmol/L — ABNORMAL LOW (ref 98–111)
Creatinine, Ser: 1.13 mg/dL — ABNORMAL HIGH (ref 0.44–1.00)
GFR, Estimated: 48 mL/min — ABNORMAL LOW (ref >60)
Glucose, Bld: 95 mg/dL (ref 70–99)
Phosphorus: 3.3 mg/dL (ref 2.5–4.6)
Potassium: 4.6 mmol/L (ref 3.5–5.1)
Sodium: 130 mmol/L — ABNORMAL LOW (ref 135–145)

## 2022-11-23 LAB — CBC
HCT: 26.6 % — ABNORMAL LOW (ref 36.0–46.0)
Hemoglobin: 8.9 g/dL — ABNORMAL LOW (ref 12.0–15.0)
MCH: 31.7 pg (ref 26.0–34.0)
MCHC: 33.5 g/dL (ref 30.0–36.0)
MCV: 94.7 fL (ref 80.0–100.0)
Platelets: 191 10*3/uL (ref 150–400)
RBC: 2.81 MIL/uL — ABNORMAL LOW (ref 3.87–5.11)
RDW: 13.4 % (ref 11.5–15.5)
WBC: 6.2 10*3/uL (ref 4.0–10.5)
nRBC: 0 % (ref 0.0–0.2)

## 2022-11-23 LAB — MAGNESIUM: Magnesium: 2.1 mg/dL (ref 1.7–2.4)

## 2022-11-23 LAB — HEPARIN LEVEL (UNFRACTIONATED): Heparin Unfractionated: >1.1 IU/mL — ABNORMAL HIGH (ref 0.30–0.70)

## 2022-11-23 LAB — APTT: aPTT: 62 seconds — ABNORMAL HIGH (ref 24–36)

## 2022-11-23 MED ORDER — TORSEMIDE 20 MG PO TABS
60.0000 mg | ORAL_TABLET | Freq: Two times a day (BID) | ORAL | Status: DC
  Administered 2022-11-23 – 2022-11-24 (×3): 60 mg via ORAL
  Filled 2022-11-23 (×4): qty 3

## 2022-11-23 MED ORDER — APIXABAN 5 MG PO TABS
5.0000 mg | ORAL_TABLET | Freq: Two times a day (BID) | ORAL | Status: DC
  Administered 2022-11-23 – 2022-11-24 (×3): 5 mg via ORAL
  Filled 2022-11-23 (×3): qty 1

## 2022-11-23 NOTE — Progress Notes (Signed)
Occupational Therapy Treatment Patient Details Name: [name] MRN: [id number] DOB: 1935/01/22 Today's Date: 11/23/2022   History of present illness 88 y.o. female admitted with back pain. Pt with acute L3 inferior endplate compression fracture, and multilevel lumbar spondylosis and moderate spinal canal stenosis at L4-L5; hyponatremia. PMH of diastolic CHF, [id number], A-fib on Eliquis, COPD, CAD, left breast cancer, colon cancer, GERD and anxiety.   OT comments  Pt continuing to progress towards patient focused goals. Pt currently needing min guard assist with increased time to perform sit>stands and completes functional ambulation with Min A. Pt makes small shuffling strides with LLE and had increased difficulty offloading LLE, but reports just R hip pain. When standing at sink, Pt reliant on UE support to maintain balance, completed grooming routine with Min guard assist to maintain balance. Pt would benefit from continued skilled acute OT services to address functional deficits and help transition to next level of care. Pt, at this current level of progression, would benefit from skilled post acute OT services with 24/7 assist/support to maximize functional independence.    Recommendations for follow up therapy are one component of a multi-disciplinary discharge planning process, led by the attending physician.  Recommendations [place] be updated based on patient status, additional functional criteria and insurance authorization.    Follow Up Recommendations  Skilled nursing-short term rehab (<3 hours/day)     Assistance Recommended at Discharge Frequent or constant Supervision/Assistance  Patient can return home with the following  A lot of help with bathing/dressing/bathroom;Assistance with cooking/housework;Assist for transportation;Help with stairs or ramp for entrance;A little help with walking and/or transfers   Equipment Recommendations   (TBD at next level of care)    Recommendations for  Other Services      Precautions / Restrictions Precautions Precautions: Fall;Back;Other (comment) (back for comfort) Restrictions Weight Bearing Restrictions: No       Mobility Bed Mobility Overal bed mobility: Needs Assistance Bed Mobility: Supine to Sit, Sit to Supine     Supine to sit: Min guard (increased time) Sit to supine: Min assist   General bed mobility comments: Pt needed assistance to position RLE back into bed    Transfers Overall transfer level: Needs assistance Equipment used: Rolling walker (2 wheels) Transfers: Sit to/from Stand Sit to Stand: Min guard (increased time)                 Balance Overall balance assessment: Needs assistance Sitting-balance support: Bilateral upper extremity supported, Feet supported Sitting balance-Leahy Scale: Good     Standing balance support: Reliant on assistive device for balance, Bilateral upper extremity supported, During functional activity Standing balance-Leahy Scale: Poor                             ADL either performed or assessed with clinical judgement   ADL Overall ADL's : Needs assistance/impaired Eating/Feeding: Independent   Grooming: Wash/dry face;Standing Grooming Details (indicate cue type and reason): standing at sink, Needs one UE supported to maintain balance. Pt brushed hair while supine in bed                             Functional mobility during ADLs: Rolling walker (2 wheels);Minimal assistance      Extremity/Trunk Assessment Upper Extremity Assessment LUE Deficits / Details: L shoulder genrally "weak" but functional   Lower Extremity Assessment Lower Extremity Assessment: Defer to PT evaluation RLE Deficits /  Details: BLE numbness RLE Sensation: decreased light touch;decreased proprioception LLE Deficits / Details: BLE numbness LLE Sensation: decreased light touch;decreased proprioception   Cervical / Trunk Assessment Cervical / Trunk Assessment:  Other exceptions (acute L4 compression fx)    Vision Baseline Vision/History: 1 Wears glasses (at all times) Patient Visual Report: Blurring of vision (Per previous OT note, difficulty with reading) Additional Comments: Had cataracts removed, all visual information from previous OT note   Perception Perception Perception: Not tested   Praxis Praxis Praxis: Intact    Cognition Arousal/Alertness: Awake/alert Behavior During Therapy: WFL for tasks assessed/performed Overall Cognitive Status: Within Functional Limits for tasks assessed                                          Exercises      Shoulder Instructions       General Comments VSS on RA. Shuffles LLE     Pertinent Vitals/ Pain       Pain Assessment Pain Assessment: 0-10 Pain Score: 5  Pain Location: R hip Pain Descriptors / Indicators: Aching, Grimacing, Discomfort, Guarding Pain Intervention(s): Monitored during session, Limited activity within patient's tolerance  Home Living                                          Prior Functioning/Environment              Frequency  Min 2X/week        Progress Toward Goals  OT Goals(current goals can now be found in the care plan section)  Progress towards OT goals: Progressing toward goals  Acute Rehab OT Goals Patient Stated Goal: to be out of pain and be able to go home OT Goal Formulation: With patient Time For Goal Achievement: 12/03/22 Potential to Achieve Goals: Good  Plan Discharge plan remains appropriate;Frequency remains appropriate    Co-evaluation                 AM-PAC OT "6 Clicks" Daily Activity     Outcome Measure   Help from another person eating meals?: None Help from another person taking care of personal grooming?: A Little Help from another person toileting, which includes using toliet, bedpan, or urinal?: A Little Help from another person bathing (including washing, rinsing, drying)?: A  Lot Help from another person to put on and taking off regular upper body clothing?: A Little Help from another person to put on and taking off regular lower body clothing?: A Lot 6 Click Score: 17    End of Session Equipment Utilized During Treatment: Gait belt;Rolling walker (2 wheels)  OT Visit Diagnosis: Unsteadiness on feet (R26.81);Other abnormalities of gait and mobility (R26.89);Muscle weakness (generalized) (M62.81);Pain Pain - Right/Left: Right Pain - part of body: Hip   Activity Tolerance Patient limited by pain;Patient tolerated treatment well   Patient Left in bed;with call bell/phone within reach   Nurse Communication Mobility status        Time: [id number] OT Time Calculation (min): 13 min  Charges: OT General Charges $OT Visit: 1 Visit OT Treatments $Self Care/Home Management : 8-22 mins  11/23/2022  AB, OTR/L  Acute Rehabilitation Services  Office: [phone]   Cori Razor 11/23/2022, 4:16 PM

## 2022-11-23 NOTE — Plan of Care (Signed)
Patient is ready for discharge to SNF once bed available. Updated patient's son over the phone.

## 2022-11-23 NOTE — Chronic Care Management (AMB) (Signed)
   11/23/2022  [name] August 24, 1935 [id number]   Reason for Encounter: CCM enrollment status changed to previously enrolled   [place] Manager/Chronic Care Management [phone]

## 2022-11-23 NOTE — Progress Notes (Signed)
[place] for heparin > apixaban Indication: atrial fibrillation  Allergies  Allergen Reactions   Contrast Media [Iodinated Contrast Media] Hives and Other (See Comments)    Chest pain and burning    Clonidine Derivatives Other (See Comments)    Dry mouth, throat   Statins Other (See Comments)    Myalgias   Sulfa Antibiotics Diarrhea    Tremors    Celebrex [Celecoxib] Rash   Isordil [Isosorbide Nitrate] Itching and Rash   Tape Itching and Rash    Redness at application site Reaction is to paper and plastic tapes and EKG leads    Patient Measurements: Height: 5\' 5"  (165.1 cm) Weight: 77.8 kg (171 lb 9.6 oz) IBW/kg (Calculated) : 57 Heparin Dosing Weight: 74 kg  Vital Signs: Temp: 98 F (36.7 C) (03/19 0600) Temp Source: Oral (03/19 0600) BP: 161/52 (03/19 0945) Pulse Rate: 61 (03/19 0600)  Labs: Recent Labs    11/21/22 0025 11/22/22 0043 11/23/22 0135  HGB 8.8* 8.5* 8.9*  HCT 27.2* 26.7* 26.6*  PLT 154 168 191  APTT 102* 84* 62*  LABPROT  --  16.4*  --   INR  --  1.3*  --   HEPARINUNFRC >1.10* >1.10* >1.10*  CREATININE 0.95 1.27* 1.13*    Estimated Creatinine Clearance: 37.5 mL/min (A) (by C-G formula based on SCr of 1.13 mg/dL (H)).   Medical History: Past Medical History:  Diagnosis Date   Allergy    rhinitis   Aortic stenosis    mild by echo 06/2017   Arthritis    Bradycardia    a. 10/2017 -> beta blocker cut back due to HR 39.   Breast cancer ([place]) 01/06/2012   Cancer ([place])    right colon and left breast   Chronic diastolic CHF (congestive heart failure) ([place])    Colon cancer ([facility]) 01/06/2012   Colovesical fistula    Dr. Marlou Starks and [name] planning surgery 08/2018- surgery revealed spontaneous closure   COPD (chronic obstructive pulmonary disease) ([place])    pt. denies   Coronary artery disease 2006   a.  NSTEMI in 2016, cath showed 15% prox-mid RCA, 20% prox LAD, EF 25-35% by cath and 35-40% -> felt due to  Takotsubo cardiomyopathy.   Dilated aortic root ([facility])    55mmHg by echo 06/2017   Diverticulosis    Dyspnea    Edema extremities    GERD (gastroesophageal reflux disease)    Hernia    Hiatal hernia    denies   Hyperlipidemia    Hypertension    Mild aortic stenosis    echo 11/2015 but not noted on echo 06/2016   Osteopenia    Permanent atrial fibrillation (H[facility])    chronic atrial fibrillation   Pneumonia    hx child   Pulmonary HTN ([place])    a. moderate to severe PASP 54mmHg echo 11/2015 - now 69mmHg by echo 06/2017. CTA chest in 11/16 with no PE. PFTs in 7/15 with mild obstructive lung disease. She had a negative sleep study in 2017. b. Felt due to left sided HF.   Stroke [facility])    Takotsubo syndrome 07/29/2015   a. EF 35-40% by echo; akinesis of mid-apical anteroseptal and apical myocardium.  EF now normalized on echo 11/2015   Assessment: 88 yo female on chronic Eliquis for afib now with need for L3 and L4 kyphoplasty.  Pharmacy asked dose IV heparin tomorrow in anticipation of OR next week.  CBC stable, no issues  with heparin infusion or signs of bleeding reported.  Goal of Therapy:  aPTT 66-102 Heparin level 0.3-0.7 units/ml Monitor platelets by anticoagulation protocol: Yes   Plan:  Stop heparin infusion Start apixaban 5mg  PO twice daily Continue to monitor H&H and platelets  Thank you for allowing pharmacy to be a part of this patient's care.  Ardyth Harps, PharmD Clinical Pharmacist

## 2022-11-23 NOTE — Progress Notes (Signed)
Physical Therapy Treatment Patient Details Name: [name] MRN: [id number] DOB: 22-Oct-1934 Today's Date: 11/23/2022   History of Present Illness 88 y.o. female admitted with back pain. Pt with acute L3 inferior endplate compression fracture, and multilevel lumbar spondylosis and moderate spinal canal stenosis at L4-L5; hyponatremia. PMH of diastolic CHF, [id number], A-fib on Eliquis, COPD, CAD, left breast cancer, colon cancer, GERD and anxiety.    PT Comments    Pt with fair tolerance to treatment today. Pt is now s/p L3-L4 kyphoplasty yesterday. Pt reported improved back pain compared to previous session and was able ambulate in room with Min A RW. Pt now reports B hip pain which limited further mobility. No change in DC/DME recs at this time. PT will continue to follow.    Recommendations for follow up therapy are one component of a multi-disciplinary discharge planning process, led by the attending physician.  Recommendations [place] be updated based on patient status, additional functional criteria and insurance authorization.  Follow Up Recommendations  Skilled nursing-short term rehab (<3 hours/day) Can patient physically be transported by private vehicle: No   Assistance Recommended at Discharge Frequent or constant Supervision/Assistance  Patient can return home with the following A lot of help with bathing/dressing/bathroom;A lot of help with walking and/or transfers;Assistance with cooking/housework;Assist for transportation;Help with stairs or ramp for entrance   Equipment Recommendations  Other (comment) (per accepting facility)    Recommendations for Other Services       Precautions / Restrictions Precautions Precautions: Fall;Back;Other (comment) (back for comfort) Restrictions Weight Bearing Restrictions: No     Mobility  Bed Mobility Overal bed mobility: Needs Assistance Bed Mobility: Supine to Sit, Sit to Supine     Supine to sit: Min guard Sit to supine: Min  assist   General bed mobility comments: Pt requested assistance with RLE management getting back into bed. Once again attempted to teach logroll but pt refused stating that she cant roll.    Transfers Overall transfer level: Needs assistance Equipment used: Rolling walker (2 wheels) Transfers: Sit to/from Stand Sit to Stand: Min guard           General transfer comment: Cues for hand placement. Pt states that she wanted to do it herself.    Ambulation/Gait Ambulation/Gait assistance: Min assist Gait Distance (Feet): 20 Feet Assistive device: Rolling walker (2 wheels) Gait Pattern/deviations: Antalgic, Step-to pattern, Decreased stance time - left, Decreased step length - left, Decreased stride length, Drifts right/left, Trunk flexed, Narrow base of support Gait velocity: decreased     General Gait Details: Pt reported B hip pain that got worse with ambulation   Stairs             Wheelchair Mobility    Modified Rankin (Stroke Patients Only)       Balance Overall balance assessment: Needs assistance Sitting-balance support: Bilateral upper extremity supported, Feet supported Sitting balance-Leahy Scale: Good     Standing balance support: Reliant on assistive device for balance, Bilateral upper extremity supported, During functional activity Standing balance-Leahy Scale: Fair Standing balance comment: Improved compared to first session but still heavily reliant on RW                            Cognition Arousal/Alertness: Awake/alert Behavior During Therapy: Flat affect Overall Cognitive Status: Within Functional Limits for tasks assessed  Exercises      General Comments General comments (skin integrity, edema, etc.): VSS on RA      Pertinent Vitals/Pain Pain Assessment Pain Assessment: Faces Faces Pain Scale: Hurts little more Pain Location: B hips Pain Descriptors /  Indicators: Aching, Grimacing, Discomfort, Guarding Pain Intervention(s): Monitored during session    Home Living                          Prior Function            PT Goals (current goals can now be found in the care plan section) Progress towards PT goals: Progressing toward goals    Frequency    Min 3X/week      PT Plan Current plan remains appropriate    Co-evaluation              AM-PAC PT "6 Clicks" Mobility   Outcome Measure  Help needed turning from your back to your side while in a flat bed without using bedrails?: A Lot Help needed moving from lying on your back to sitting on the side of a flat bed without using bedrails?: A Little Help needed moving to and from a bed to a chair (including a wheelchair)?: A Little Help needed standing up from a chair using your arms (e.g., wheelchair or bedside chair)?: A Little Help needed to walk in hospital room?: A Lot Help needed climbing 3-5 steps with a railing? : Total 6 Click Score: 14    End of Session Equipment Utilized During Treatment: Gait belt Activity Tolerance: Patient limited by pain Patient left: in bed;with call bell/phone within reach;with family/visitor present Nurse Communication: Mobility status PT Visit Diagnosis: Other abnormalities of gait and mobility (R26.89);Pain Pain - Right/Left:  (Bilateral) Pain - part of body: Hip     Time: [id number] PT Time Calculation (min) (ACUTE ONLY): 17 min  Charges:  $Gait Training: 8-22 mins                     Shelby Mattocks, PT, DPT Acute Rehab Services [id number]    [name] 11/23/2022, 12:22 PM

## 2022-11-23 NOTE — Plan of Care (Signed)
  Problem: Clinical Measurements: Goal: Respiratory complications will improve Outcome: Completed/Met   Problem: Nutrition: Goal: Adequate nutrition will be maintained Outcome: Completed/Met   

## 2022-11-23 NOTE — Progress Notes (Addendum)
PROGRESS NOTE  [name] [id number] DOB: 1935-02-01   PCP: [name], MD  Patient is from: Home. Lives alone. Uses cane at times.   DOA: 11/18/2022 LOS: 4  Chief complaints Chief Complaint  Patient presents with   Back Pain     Brief Narrative / Interim history: 88 y.o. female PMH of diastolic CHF, [id number], A-fib on Eliquis, COPD, CAD, left breast cancer, colon cancer, GERD and anxiety presenting with low back pain and right hip pain for 2 days.  Seen by PCP the day prior to presentation and had a CT that showed L3 inferior endplate compression fracture with 35% height loss but no retropulsion, and multilevel lumbar spondylosis and moderate spinal canal stenosis at L4-L5.  She was advised to go to the hospital but did not come until her pain was unbearable the next day.    In ED, she was hyponatremic to 121 (baseline 130s).  Pelvic x-ray without acute finding.  Initially, concern about hypervolemic hyponatremia and she was given IV Lasix.  However, creatinine and hyponatremia gotten worse.  She was started on gentle IV fluid.  IR consulted for kyphoplasty and recommended holding Eliquis starting 3/16.  MRI lumbar spine confirms CT finding.  Patient had L3 and L4 balloon kyphoplasty on 3/18.  Therapy recommended SNF.  Medically stable for discharge.   Subjective: Seen and examined earlier this morning.  No major events overnight of this morning.  No complaints.  Pain has improved.  Denies chest pain, shortness of breath or cough.  Denies GI or UTI symptoms.  Excellent urine output.  Cr and sodium.  Objective: Vitals:   11/23/22 0004 11/23/22 0235 11/23/22 0600 11/23/22 0945  BP: (!) 147/45 (!) 153/59 (!) 160/41 (!) 161/52  Pulse: (!) 55 (!) 57 61   Resp: 18  18   Temp: 97.8 F (36.6 C)  98 F (36.7 C)   TempSrc: Oral  Oral   SpO2: 92%  97%   Weight:   77.8 kg   Height:        Examination: GENERAL: No apparent distress.  Nontoxic. HEENT: MMM.  Vision and hearing  grossly intact.  NECK: Supple.  No apparent JVD.  RESP:  No IWOB.  Fair aeration bilaterally. CVS: Irregular rhythm.  Normal rate.  2/6 SEM over RUSB. ABD/GI/GU: BS+. Abd soft, NTND.  MSK/EXT:   No apparent deformity. Moves extremities.  Trace BLE edema. SKIN: no apparent skin lesion or wound NEURO: Awake and alert. Oriented appropriately.  No apparent focal neuro deficit. PSYCH: Calm. Normal affect.   Procedures:  3/18-L3 and L4 balloon kyphoplasty  Microbiology summarized: None  Assessment and plan: Principal Problem:   Hyponatremia Active Problems:   Persistent atrial fibrillation (HCC)   Hypertension   COPD (chronic obstructive pulmonary disease) (HCC)   Coronary artery disease   Chronic diastolic CHF (congestive heart failure) (HCC)   Chronic kidney disease, stage 3b (HCC)   Closed compression fracture of L3 lumbar vertebra, initial encounter (HCC)   Lower back pain   Normocytic anemia  Hyponatremia: Likely due to metolazone, Aldactone and losartan.  Baseline about 130. Recent Labs  Lab 11/18/22 2046 11/19/22 0253 11/19/22 0718 11/19/22 1238 11/19/22 2003 11/20/22 0100 11/20/22 0706 11/21/22 0025 11/22/22 0043 11/23/22 0135  NA 120* 118* 121* 122* 123* 125* 126* 128* 129* 130*  -Continue holding home Aldactone and metolazone -Increase torsemide to 60 mg twice daily.  Takes 80 twice daily at home.   Acute lower back pain due to acute  close L3 compression fracture: No reported trauma, fall or heavy lifting. CT showed L3 compression fracture with 35% height loss but no retropulsion.  MRI confirmed this.  Vitamin D level 25.11. -S/p L3 and L4 kyphoplasty on 3/8. -Pain control with scheduled Tylenol, as needed oxy and Dilaudid -Aggressive bowel regimen -P.o. vitamin D -PT/OT   Chronic diastolic CHF: Patient has chronic dyspnea and BLE edema unchanged from baseline.  No orthopnea or PND.  Overall, she appears euvolemic on exam.  CXR without vascular congestion or  edema.  BNP only 136.  TTE in 03/2022 with LVEF of 60 to 65%, RVSP of 50 mmHg.  Restarted on home torsemide at half dose.  About 2.9 L UOP/24 hours.  Net -2.4 L. Cr stable.  Sodium improved. -Increase torsemide to 60 mg twice daily.  Takes 80 twice daily at home -Continue home losartan at half dose -Continue holding metolazone and Aldactone. -Strict intake and output, daily weight, renal functions and electrolytes   Chronic COPD: Stable. Ruthe Mannan instead of home Symbicort -DuoNebs as needed   History of CAD: No chest pain.  Seems to take Ranexa as needed at home -Holding losartan as above.   Persistent atrial fibrillation: Rate controlled without medications.  On low-dose Eliquis for anticoagulation -Resume home Eliquis. -Optimize electrolytes.   Essential hypertension: BP slightly elevated. -Continue home clonidine -Resumed home losartan at half dose. -Diuretics as above.   CKD-3B: Cr stable. Recent Labs    11/10/22 1459 11/17/22 1151 11/18/22 0635 11/18/22 1350 11/18/22 2046 11/19/22 0253 11/20/22 0706 11/21/22 0025 11/22/22 0043 11/23/22 0135  BUN 42* 52* 44* 42* 43* 46* 37* 34* 39* 38*  CREATININE 1.44* 1.45* 1.21* 1.29* 1.39* 1.33* 1.05* 0.95 1.27* 1.13*  -Continue monitoring  Normocytic iron deficiency anemia: Slight drop in Hgb likely dilutional.  No overt bleeding. Recent Labs    09/20/22 1547 09/28/22 0902 10/29/22 1244 11/17/22 1151 11/18/22 0635 11/19/22 0253 11/20/22 0100 11/21/22 0025 11/22/22 0043 11/23/22 0135  HGB 10.3* 10.7* 10.3* 10.4* 10.2* 9.4* 9.1* 8.8* 8.5* 8.9*  -Received IV ferric gluconate 250 mg daily x 2 -Vitamin B12 is 270.  Received vitamin B12 injection.  Continue p.o.   Constipation: Resolved. -Bowel regimen   Goal of care counseling/ACP: Full code.   -See discussion on 3/14.  Mild hyperkalemia: Resolved. -Recheck in the morning  Physical deconditioning -PT/OT-recommended SNF.  Obesity Body mass index is 28.56  kg/m.          DVT prophylaxis:  On full dose anticoagulation apixaban (ELIQUIS) tablet 5 mg  Code Status: Full code Family Communication: None at bedside.   Level of care: Telemetry Cardiac Status is: Inpatient The patient will remain inpatient because: Placement/SNF.   Final disposition: SNF Consultants:  Interventional radiology  35 minutes with more than 50% spent in reviewing records, counseling patient/family and coordinating care.   Sch Meds:  Scheduled Meds:  acetaminophen  1,000 mg Oral Q8H   apixaban  5 mg Oral BID   cholecalciferol  1,000 Units Oral Daily   cloNIDine  0.1 mg Oral BID   vitamin B-12  1,000 mcg Oral Daily   losartan  50 mg Oral Daily   mometasone-formoterol  2 puff Inhalation BID   torsemide  60 mg Oral BID   Continuous Infusions:   PRN Meds:.clonazePAM, famotidine, guaiFENesin-dextromethorphan, hydrALAZINE, HYDROmorphone (DILAUDID) injection, ipratropium-albuterol, ondansetron (ZOFRAN) IV, oxyCODONE, phenol, polyethylene glycol, [COMPLETED] senna-docusate **FOLLOWED BY** senna-docusate  Antimicrobials: Anti-infectives (From admission, onward)    Start     Dose/Rate Route  Frequency Ordered Stop   11/22/22 1110  tobramycin (NEBCIN) 1.2 g powder       Note to Pharmacy: Carpentieri, Christi: cabinet override      11/22/22 1110 11/22/22 1229   11/22/22 1009  ceFAZolin (ANCEF) 2-4 GM/100ML-% IVPB  Status:  Discontinued       Note to Pharmacy: Margaretmary Dys E: cabinet override      11/22/22 1009 11/22/22 1139   11/22/22 0930  ceFAZolin (ANCEF) IVPB 2g/100 mL premix        2 g 200 mL/hr over 30 Minutes Intravenous To Radiology 11/22/22 0839 11/22/22 1038        I have personally reviewed the following labs and images: CBC: Recent Labs  Lab 11/17/22 1151 11/18/22 0635 11/19/22 0253 11/20/22 0100 11/21/22 0025 11/22/22 0043 11/23/22 0135  WBC 5.1 5.7 5.3 5.7 6.6 5.4 6.2  NEUTROABS 3,259 3.8  --   --   --  2.6  --   HGB 10.4*  10.2* 9.4* 9.1* 8.8* 8.5* 8.9*  HCT 32.0* 30.0* 27.8* 27.2* 27.2* 26.7* 26.6*  MCV 92.5 91.7 92.1 93.2 95.4 97.8 94.7  PLT 229 200 177 169 154 168 191   BMP &GFR Recent Labs  Lab 11/19/22 0253 11/19/22 0718 11/20/22 0100 11/20/22 0706 11/21/22 0025 11/22/22 0043 11/23/22 0135  NA 118*   < > 125* 126* 128* 129* 130*  K 4.1  --   --  4.4 4.8 5.2* 4.6  CL 84*  --   --  93* 98 94* 94*  CO2 27  --   --  24 24 26 25   GLUCOSE 114*  --   --  92 106* 106* 95  BUN 46*  --   --  37* 34* 39* 38*  CREATININE 1.33*  --   --  1.05* 0.95 1.27* 1.13*  CALCIUM 8.5*  --   --  8.7* 8.7* 8.8* 9.1  MG 2.1  --  2.2  --  2.3 2.1 2.1  PHOS 3.5  --   --  2.6 2.8 3.2 3.3   < > = values in this interval not displayed.   Estimated Creatinine Clearance: 37.5 mL/min (A) (by C-G formula based on SCr of 1.13 mg/dL (H)). Liver & Pancreas: Recent Labs  Lab 11/17/22 1151 11/18/22 1350 11/19/22 0253 11/20/22 0706 11/21/22 0025 11/22/22 0043 11/23/22 0135  AST 14  --   --   --   --   --   --   ALT 7  --   --   --   --   --   --   BILITOT 0.6  --   --   --   --   --   --   PROT 6.9  --   --   --   --   --   --   ALBUMIN  --    < > 3.1* 2.8* 2.7* 2.6* 2.7*   < > = values in this interval not displayed.   No results for input(s): "LIPASE", "AMYLASE" in the last 168 hours. No results for input(s): "AMMONIA" in the last 168 hours. Diabetic: No results for input(s): "HGBA1C" in the last 72 hours. No results for input(s): "GLUCAP" in the last 168 hours. Cardiac Enzymes: No results for input(s): "CKTOTAL", "CKMB", "CKMBINDEX", "TROPONINI" in the last 168 hours. No results for input(s): "PROBNP" in the last 8760 hours. Coagulation Profile: Recent Labs  Lab 11/22/22 0043  INR 1.3*   Thyroid Function Tests: No results for  input(s): "TSH", "T4TOTAL", "FREET4", "T3FREE", "THYROIDAB" in the last 72 hours.  Lipid Profile: No results for input(s): "CHOL", "HDL", "LDLCALC", "TRIG", "CHOLHDL", "LDLDIRECT" in  the last 72 hours. Anemia Panel: No results for input(s): "VITAMINB12", "FOLATE", "FERRITIN", "TIBC", "IRON", "RETICCTPCT" in the last 72 hours.  Urine analysis:    Component Value Date/Time   COLORURINE STRAW (A) 11/18/2022 1006   APPEARANCEUR CLEAR 11/18/2022 1006   LABSPEC 1.006 11/18/2022 1006   PHURINE 5.0 11/18/2022 1006   GLUCOSEU NEGATIVE 11/18/2022 1006   HGBUR NEGATIVE 11/18/2022 1006   BILIRUBINUR NEGATIVE 11/18/2022 1006   KETONESUR NEGATIVE 11/18/2022 1006   PROTEINUR NEGATIVE 11/18/2022 1006   UROBILINOGEN 0.2 11/04/2013 1321   NITRITE NEGATIVE 11/18/2022 1006   LEUKOCYTESUR NEGATIVE 11/18/2022 1006   Sepsis Labs: Invalid input(s): "PROCALCITONIN", "LACTICIDVEN"  Microbiology: No results found for this or any previous visit (from the past 240 hour(s)).  Radiology Studies: No results found.    Ceasar Decandia T. [place]  If 7PM-7AM, please contact night-coverage [email or web address] 11/23/2022, 2:01 PM

## 2022-11-23 NOTE — Progress Notes (Signed)
[place] for apixaban > IV heparin Indication: atrial fibrillation  Allergies  Allergen Reactions   Contrast Media [Iodinated Contrast Media] Hives and Other (See Comments)    Chest pain and burning    Clonidine Derivatives Other (See Comments)    Dry mouth, throat   Statins Other (See Comments)    Myalgias   Sulfa Antibiotics Diarrhea    Tremors    Celebrex [Celecoxib] Rash   Isordil [Isosorbide Nitrate] Itching and Rash   Tape Itching and Rash    Redness at application site Reaction is to paper and plastic tapes and EKG leads    Patient Measurements: Height: 5\' 5"  (165.1 cm) Weight: 81.9 kg (180 lb 8.9 oz) IBW/kg (Calculated) : 57 Heparin Dosing Weight: 74 kg  Vital Signs: Temp: 97.8 F (36.6 C) (03/19 0004) Temp Source: Oral (03/19 0004) BP: 147/45 (03/19 0004) Pulse Rate: 55 (03/19 0004)  Labs: Recent Labs    11/21/22 0025 11/22/22 0043 11/23/22 0135  HGB 8.8* 8.5* 8.9*  HCT 27.2* 26.7* 26.6*  PLT 154 168 191  APTT 102* 84* 62*  LABPROT  --  16.4*  --   INR  --  1.3*  --   HEPARINUNFRC >1.10* >1.10* >1.10*  CREATININE 0.95 1.27* 1.13*     Estimated Creatinine Clearance: 38.5 mL/min (A) (by C-G formula based on SCr of 1.13 mg/dL (H)).   Medical History: Past Medical History:  Diagnosis Date   Allergy    rhinitis   Aortic stenosis    mild by echo 06/2017   Arthritis    Bradycardia    a. 10/2017 -> beta blocker cut back due to HR 39.   Breast cancer ([place]) 01/06/2012   Cancer ([place])    right colon and left breast   Chronic diastolic CHF (congestive heart failure) ([place])    Colon cancer ([place]) 01/06/2012   Colovesical fistula    Dr. Marlou Starks and [name] planning surgery 08/2018- surgery revealed spontaneous closure   COPD (chronic obstructive pulmonary disease) ([place])    pt. denies   Coronary artery disease 2006   a.  NSTEMI in 2016, cath showed 15% prox-mid RCA, 20% prox LAD, EF 25-35% by cath and 35-40% -> felt  due to Takotsubo cardiomyopathy.   Dilated aortic root ([place])    61mmHg by echo 06/2017   Diverticulosis    Dyspnea    Edema extremities    GERD (gastroesophageal reflux disease)    Hernia    Hiatal hernia    denies   Hyperlipidemia    Hypertension    Mild aortic stenosis    echo 11/2015 but not noted on echo 06/2016   Osteopenia    Permanent atrial fibrillation (HCC)    chronic atrial fibrillation   Pneumonia    hx child   Pulmonary HTN ([place])    a. moderate to severe PASP 28mmHg echo 11/2015 - now 10mmHg by echo 06/2017. CTA chest in 11/16 with no PE. PFTs in 7/15 with mild obstructive lung disease. She had a negative sleep study in 2017. b. Felt due to left sided HF.   Stroke [facility])    Takotsubo syndrome 07/29/2015   a. EF 35-40% by echo; akinesis of mid-apical anteroseptal and apical myocardium.  EF now normalized on echo 11/2015   Assessment: 88 yo female on chronic Eliquis for afib now with need for L3 and L4 kyphoplasty.  Pharmacy asked dose IV heparin tomorrow in anticipation of OR next week.  Heparin level  still falsely elevated due to recent apixban at > 1.1. aPTT 62 on heparin 900 units/hr therapeutic. Heparin infusion paused 3/18 for procedure. aPTT collected ~7 hours post infusion. Given patients low CrCl and the several previous therapeutic levels on this rate, the level will most likely increase and be therapeutic later today.  No issues with heparin infusion or s/sx bleeding reported. CBC stable  Goal of Therapy:  aPTT 66-102 Heparin level 0.3-0.7 units/ml Monitor platelets by anticoagulation protocol: Yes   Plan:  Continue heparin infusion at 900 units/hr Check heparin level daily while on heparin Continue to monitor H&H and platelets  Georga Bora, PharmD Clinical Pharmacist 11/23/2022 2:43 AM Please check AMION for all [place] numbers

## 2022-11-24 DIAGNOSIS — R54 Age-related physical debility: Secondary | ICD-10-CM | POA: Diagnosis not present

## 2022-11-24 DIAGNOSIS — K5790 Diverticulosis of intestine, part unspecified, without perforation or abscess without bleeding: Secondary | ICD-10-CM | POA: Diagnosis not present

## 2022-11-24 DIAGNOSIS — I10 Essential (primary) hypertension: Secondary | ICD-10-CM | POA: Diagnosis not present

## 2022-11-24 DIAGNOSIS — M25551 Pain in right hip: Secondary | ICD-10-CM | POA: Diagnosis not present

## 2022-11-24 DIAGNOSIS — I130 Hypertensive heart and chronic kidney disease with heart failure and stage 1 through stage 4 chronic kidney disease, or unspecified chronic kidney disease: Secondary | ICD-10-CM | POA: Diagnosis not present

## 2022-11-24 DIAGNOSIS — I5032 Chronic diastolic (congestive) heart failure: Secondary | ICD-10-CM | POA: Diagnosis not present

## 2022-11-24 DIAGNOSIS — M48061 Spinal stenosis, lumbar region without neurogenic claudication: Secondary | ICD-10-CM | POA: Diagnosis not present

## 2022-11-24 DIAGNOSIS — R052 Subacute cough: Secondary | ICD-10-CM | POA: Diagnosis not present

## 2022-11-24 DIAGNOSIS — M6281 Muscle weakness (generalized): Secondary | ICD-10-CM | POA: Diagnosis not present

## 2022-11-24 DIAGNOSIS — E119 Type 2 diabetes mellitus without complications: Secondary | ICD-10-CM | POA: Diagnosis not present

## 2022-11-24 DIAGNOSIS — I110 Hypertensive heart disease with heart failure: Secondary | ICD-10-CM | POA: Diagnosis not present

## 2022-11-24 DIAGNOSIS — Z7401 Bed confinement status: Secondary | ICD-10-CM | POA: Diagnosis not present

## 2022-11-24 DIAGNOSIS — J440 Chronic obstructive pulmonary disease with acute lower respiratory infection: Secondary | ICD-10-CM | POA: Diagnosis not present

## 2022-11-24 DIAGNOSIS — N1832 Chronic kidney disease, stage 3b: Secondary | ICD-10-CM | POA: Diagnosis not present

## 2022-11-24 DIAGNOSIS — M8580 Other specified disorders of bone density and structure, unspecified site: Secondary | ICD-10-CM | POA: Diagnosis not present

## 2022-11-24 DIAGNOSIS — I4819 Other persistent atrial fibrillation: Secondary | ICD-10-CM | POA: Diagnosis not present

## 2022-11-24 DIAGNOSIS — S32030D Wedge compression fracture of third lumbar vertebra, subsequent encounter for fracture with routine healing: Secondary | ICD-10-CM | POA: Diagnosis not present

## 2022-11-24 DIAGNOSIS — R5383 Other fatigue: Secondary | ICD-10-CM | POA: Diagnosis not present

## 2022-11-24 DIAGNOSIS — J449 Chronic obstructive pulmonary disease, unspecified: Secondary | ICD-10-CM | POA: Diagnosis not present

## 2022-11-24 DIAGNOSIS — D509 Iron deficiency anemia, unspecified: Secondary | ICD-10-CM

## 2022-11-24 DIAGNOSIS — F419 Anxiety disorder, unspecified: Secondary | ICD-10-CM | POA: Diagnosis not present

## 2022-11-24 DIAGNOSIS — E871 Hypo-osmolality and hyponatremia: Secondary | ICD-10-CM | POA: Diagnosis not present

## 2022-11-24 DIAGNOSIS — J309 Allergic rhinitis, unspecified: Secondary | ICD-10-CM | POA: Diagnosis not present

## 2022-11-24 DIAGNOSIS — R110 Nausea: Secondary | ICD-10-CM | POA: Diagnosis not present

## 2022-11-24 DIAGNOSIS — Z7901 Long term (current) use of anticoagulants: Secondary | ICD-10-CM | POA: Diagnosis not present

## 2022-11-24 DIAGNOSIS — A000 Cholera due to Vibrio cholerae 01, biovar cholerae: Secondary | ICD-10-CM | POA: Diagnosis not present

## 2022-11-24 DIAGNOSIS — I2510 Atherosclerotic heart disease of native coronary artery without angina pectoris: Secondary | ICD-10-CM | POA: Diagnosis not present

## 2022-11-24 DIAGNOSIS — U071 COVID-19: Secondary | ICD-10-CM | POA: Diagnosis not present

## 2022-11-24 DIAGNOSIS — D631 Anemia in chronic kidney disease: Secondary | ICD-10-CM | POA: Diagnosis not present

## 2022-11-24 DIAGNOSIS — W19XXXD Unspecified fall, subsequent encounter: Secondary | ICD-10-CM | POA: Diagnosis not present

## 2022-11-24 LAB — RENAL FUNCTION PANEL
Albumin: 2.8 g/dL — ABNORMAL LOW (ref 3.5–5.0)
Anion gap: 10 (ref 5–15)
BUN: 37 mg/dL — ABNORMAL HIGH (ref 8–23)
CO2: 24 mmol/L (ref 22–32)
Calcium: 9.2 mg/dL (ref 8.9–10.3)
Chloride: 97 mmol/L — ABNORMAL LOW (ref 98–111)
Creatinine, Ser: 1.21 mg/dL — ABNORMAL HIGH (ref 0.44–1.00)
GFR, Estimated: 44 mL/min — ABNORMAL LOW (ref >60)
Glucose, Bld: 95 mg/dL (ref 70–99)
Phosphorus: 3.1 mg/dL (ref 2.5–4.6)
Potassium: 4.7 mmol/L (ref 3.5–5.1)
Sodium: 131 mmol/L — ABNORMAL LOW (ref 135–145)

## 2022-11-24 LAB — CBC
HCT: 27.4 % — ABNORMAL LOW (ref 36.0–46.0)
Hemoglobin: 9.1 g/dL — ABNORMAL LOW (ref 12.0–15.0)
MCH: 31.5 pg (ref 26.0–34.0)
MCHC: 33.2 g/dL (ref 30.0–36.0)
MCV: 94.8 fL (ref 80.0–100.0)
Platelets: 198 10*3/uL (ref 150–400)
RBC: 2.89 MIL/uL — ABNORMAL LOW (ref 3.87–5.11)
RDW: 13.9 % (ref 11.5–15.5)
WBC: 5.9 10*3/uL (ref 4.0–10.5)
nRBC: 0 % (ref 0.0–0.2)

## 2022-11-24 LAB — MAGNESIUM: Magnesium: 1.8 mg/dL (ref 1.7–2.4)

## 2022-11-24 MED ORDER — APIXABAN 5 MG PO TABS: 5.0000 mg | ORAL_TABLET | Freq: Two times a day (BID) | ORAL | Status: DC

## 2022-11-24 MED ORDER — TORSEMIDE 60 MG PO TABS: 60.0000 mg | ORAL_TABLET | Freq: Two times a day (BID) | ORAL | Status: DC

## 2022-11-24 MED ORDER — POLYETHYLENE GLYCOL 3350 17 GM/SCOOP PO POWD: 17.0000 g | Freq: Two times a day (BID) | ORAL | 2 refills | Status: DC | PRN

## 2022-11-24 MED ORDER — SENNOSIDES-DOCUSATE SODIUM 8.6-50 MG PO TABS: 2.0000 | ORAL_TABLET | Freq: Two times a day (BID) | ORAL | 0 refills | Status: DC | PRN

## 2022-11-24 MED ORDER — POLYETHYLENE GLYCOL 3350 17 G PO PACK
17.0000 g | PACK | Freq: Two times a day (BID) | ORAL | Status: DC
  Filled 2022-11-24: qty 1

## 2022-11-24 MED ORDER — CYANOCOBALAMIN 1000 MCG PO TABS: 1000.0000 ug | ORAL_TABLET | Freq: Every day | ORAL | Status: DC

## 2022-11-24 MED ORDER — CLONAZEPAM 0.5 MG PO TABS: 0.5000 mg | ORAL_TABLET | Freq: Two times a day (BID) | ORAL | 0 refills | Status: DC | PRN

## 2022-11-24 MED ORDER — POLYETHYLENE GLYCOL 3350 17 G PO PACK: 17.0000 g | PACK | Freq: Two times a day (BID) | ORAL | Status: DC | PRN

## 2022-11-24 MED ORDER — SENNOSIDES-DOCUSATE SODIUM 8.6-50 MG PO TABS
1.0000 | ORAL_TABLET | Freq: Two times a day (BID) | ORAL | Status: DC
  Filled 2022-11-24: qty 1

## 2022-11-24 MED ORDER — ACETAMINOPHEN 500 MG PO TABS: ORAL_TABLET | ORAL | 0 refills | Status: AC

## 2022-11-24 MED ORDER — OXYCODONE HCL 5 MG PO TABS: 5.0000 mg | ORAL_TABLET | Freq: Four times a day (QID) | ORAL | 0 refills | Status: AC | PRN

## 2022-11-24 NOTE — Progress Notes (Signed)
Mobility Specialist - Progress Note   11/24/22 1100  Mobility  Activity Ambulated with assistance in room  Level of Assistance Minimal assist, patient does 75% or more  Assistive Device Front wheel walker  Distance Ambulated (ft) 20 ft  Activity Response Tolerated well  Mobility Referral Yes  $Mobility charge 1 Mobility    Pt received in bed and agreeable. Ambulated to door and then sink to wash up. MinA to help move BLE back into bed from floor. Left in bed w/ call bell in reach and all needs met.   [place] Specialist Please contact via SecureChat or Rehab office at [phone]

## 2022-11-24 NOTE — Discharge Summary (Addendum)
Physician Discharge Summary  [name] [id number] DOB: Mar 08, 1935 DOA: 11/18/2022  PCP: [name], MD  Admit date: 11/18/2022 Discharge date: 11/24/2022 Admitted From: Home Disposition: SNF Recommendations for Outpatient Follow-up:  Follow up with cardiology as previously planned Check fluid status, BP, BMP and CBC in 1 week Please follow up on the following pending results: None   Discharge Condition: Stable CODE STATUS: Full code   Hospital course 88 y.o. female PMH of diastolic CHF, [id number], A-fib on Eliquis, COPD, CAD, left breast cancer, colon cancer, GERD and anxiety presenting with low back pain and right hip pain for 2 days.  Seen by PCP the day prior to presentation and had a CT that showed L3 inferior endplate compression fracture with 35% height loss but no retropulsion, and multilevel lumbar spondylosis and moderate spinal canal stenosis at L4-L5.  She was advised to go to the hospital but did not come until her pain was unbearable the next day.     In ED, she was hyponatremic to 121 (baseline 130s).  Pelvic x-ray without acute finding.  Initially, concern about hypervolemic hyponatremia and she was given IV Lasix.  However, creatinine and hyponatremia gotten worse.  She was started on gentle IV fluid and p.o. sodium chloride.  Patient's renal function and hyponatremia improved.  IV fluid and sodium chloride discontinued.  Started on torsemide at reduced dose.  Sodium continued to improve and back to baseline.  Discontinued Aldactone and metolazone on discharge.  Decreased home torsemide to 60 mg twice daily.   In regards to compression fracture, MRI lumbar spine confirms CT finding.  IR consulted.  Patient had L3 and L4 balloon kyphoplasty on 3/18 with significant improvement in her pain.  Therapy recommended SNF.  Medically stable for discharge.  See individual problem list below for more.   Problems addressed during this hospitalization Principal Problem:    Hyponatremia Active Problems:   Persistent atrial fibrillation (HCC)   Hypertension   COPD (chronic obstructive pulmonary disease) (HCC)   Coronary artery disease   Chronic diastolic CHF (congestive heart failure) (HCC)   Chronic kidney disease, stage 3b (HCC)   Closed compression fracture of L3 lumbar vertebra, initial encounter (HCC)   Lower back pain   Normocytic anemia   Hyponatremia: Likely due to metolazone, Aldactone and losartan.  Baseline about 130. Recent Labs  Lab 11/19/22 0253 11/19/22 0718 11/19/22 1238 11/19/22 2003 11/20/22 0100 11/20/22 0706 11/21/22 0025 11/22/22 0043 11/23/22 0135 11/24/22 0029  NA 118* 121* 122* 123* 125* 126* 128* 129* 130* 131*  -Continue holding home Aldactone and metolazone -Torsemide to 60 mg twice daily.  Takes 80 twice daily at home. -Recheck in 1 week   Acute lower back pain due to acute close L3 compression fracture: No reported trauma, fall or heavy lifting. CT showed L3 compression fracture with 35% height loss but no retropulsion.  MRI confirmed this.  Vitamin D level 25.11. -S/p L3 and L4 kyphoplasty on 3/18. -Significant improvement in pain and mobility after kyphoplasty. -Pain control with scheduled Tylenol and as needed oxycodone -Bowel regimen -P.o. vitamin D -PT/OT   Chronic diastolic CHF: Patient has chronic dyspnea and BLE edema unchanged from baseline.  No orthopnea or PND.  Overall, she appears euvolemic on exam.  CXR without vascular congestion or edema.  BNP only 136.  TTE in 03/2022 with LVEF of 60 to 65%, RVSP of 50 mmHg.  Restarted on torsemide at reduced dose.  Net -3.5 L.  Renal function stable. -Torsemide  to 60 mg twice daily.  Takes 80 twice daily before admission. -Continue home losartan -Continue holding metolazone and Aldactone. -Reassess fluid status, renal function and adjust diuretics as appropriate.   Chronic COPD: Stable. -Continue home inhalers.   History of CAD: No chest pain.  Seems to take  Ranexa as needed at home -Continue losartan  Persistent atrial fibrillation: Rate controlled without medications.  On low-dose Eliquis for anticoagulation -Continue full dose Eliquis.  Does not meet criteria for low-dose Eliquis. -Optimize electrolytes.   Essential hypertension: BP slightly elevated. -Continue home clonidine and losartan. -Torsemide as above -Discontinue amlodipine given lower extremity edema and CHF.   CKD-3B: Cr stable. Recent Labs    11/17/22 1151 11/18/22 0635 11/18/22 1350 11/18/22 2046 11/19/22 0253 11/20/22 0706 11/21/22 0025 11/22/22 0043 11/23/22 0135 11/24/22 0029  BUN 52* 44* 42* 43* 46* 37* 34* 39* 38* 37*  CREATININE 1.45* 1.21* 1.29* 1.39* 1.33* 1.05* 0.95 1.27* 1.13* 1.21*  -Recheck CBC in 1 week    Normocytic iron deficiency anemia: Slight drop in Hgb likely dilutional.  No overt bleeding. Recent Labs    09/28/22 0902 10/29/22 1244 11/17/22 1151 11/18/22 0635 11/19/22 0253 11/20/22 0100 11/21/22 0025 11/22/22 0043 11/23/22 0135 11/24/22 0029  HGB 10.7* 10.3* 10.4* 10.2* 9.4* 9.1* 8.8* 8.5* 8.9* 9.1*  -Received IV ferric gluconate 250 mg daily x 2 -Vitamin B12 is 270.  Received vitamin B12 injection.  Continue p.o.    Constipation: Resolved. -Bowel regimen   Goal of care counseling/ACP: Full code.     Mild hyperkalemia: Resolved. -Discontinued Aldactone.  Anxiety: Stable. -Continue home Klonopin.   Physical deconditioning -PT/OT-recommended SNF.   Overweight: Body mass index is 28.11 kg/m.            Vital signs Vitals:   11/24/22 0547 11/24/22 0608 11/24/22 0747 11/24/22 0950  BP: (!) 171/47 (!) 156/40 (!) 168/57   Pulse: 60  62   Temp: 97.7 F (36.5 C)  97.7 F (36.5 C)   Resp: 19  18   Height:      Weight:      SpO2: 96%   96%  TempSrc: Oral  Oral   BMI (Calculated):         Discharge exam  GENERAL: No apparent distress.  Nontoxic. HEENT: MMM.  Vision and hearing grossly intact.  NECK:  Supple.  No apparent JVD.  RESP:  No IWOB.  Fair aeration bilaterally. CVS: Irregular rhythm.  Normal rate.  2/6 SEM over RUSB. ABD/GI/GU: BS+. Abd soft, NTND.  MSK/EXT:  Moves extremities. No apparent deformity. No edema.  SKIN: no apparent skin lesion or wound NEURO: Awake and alert. Oriented appropriately.  No apparent focal neuro deficit. PSYCH: Calm. Normal affect.   Discharge Instructions Discharge Instructions     Diet - low sodium heart healthy   Complete by: As directed    Increase activity slowly   Complete by: As directed    No wound care   Complete by: As directed       Allergies as of 11/24/2022       Reactions   Contrast Media [iodinated Contrast Media] Hives, Other (See Comments)   Chest pain and burning   Clonidine Derivatives Other (See Comments)   Dry mouth, throat   Statins Other (See Comments)   Myalgias   Sulfa Antibiotics Diarrhea   Tremors    Celebrex [celecoxib] Rash   Isordil [isosorbide Nitrate] Itching, Rash   Tape Itching, Rash   Redness at application site Reaction  is to paper and plastic tapes and EKG leads        Medication List     STOP taking these medications    amLODipine 5 MG tablet Commonly known as: NORVASC   metolazone 2.5 MG tablet Commonly known as: ZAROXOLYN   potassium chloride 10 MEQ tablet Commonly known as: KLOR-CON   spironolactone 25 MG tablet Commonly known as: ALDACTONE       TAKE these medications    acetaminophen 500 MG tablet Commonly known as: TYLENOL Take 2 tablets (1,000 mg total) by mouth every 8 (eight) hours for 5 days, THEN 2 tablets (1,000 mg total) every 8 (eight) hours as needed for up to 25 days. Start taking on: November 24, 2022 What changed: See the new instructions.   albuterol 108 (90 Base) MCG/ACT inhaler Commonly known as: VENTOLIN HFA Inhale 2 puffs into the lungs every 6 (six) hours as needed for wheezing or shortness of breath.   apixaban 5 MG Tabs tablet Commonly known as:  ELIQUIS Take 1 tablet (5 mg total) by mouth 2 (two) times daily. What changed: Another medication with the same name was removed. Continue taking this medication, and follow the directions you see here.   budesonide-formoterol 80-4.5 MCG/ACT inhaler Commonly known as: Symbicort Inhale 2 puffs into the lungs in the morning and at bedtime. What changed:  when to take this additional instructions   clonazePAM 0.5 MG tablet Commonly known as: KLONOPIN Take 1 tablet (0.5 mg total) by mouth 2 (two) times daily as needed. for anxiety What changed:  when to take this reasons to take this additional instructions   cloNIDine 0.1 MG tablet Commonly known as: CATAPRES Take 1 tablet by mouth twice daily   cyanocobalamin 1000 MCG tablet Take 1 tablet (1,000 mcg total) by mouth daily. Start taking on: November 25, 2022   famotidine 20 MG tablet Commonly known as: PEPCID Take 20 mg by mouth at bedtime as needed for heartburn.   ferrous sulfate 325 (65 FE) MG tablet Take 1 tablet (325 mg total) by mouth daily with breakfast.   fluticasone 50 MCG/ACT nasal spray Commonly known as: FLONASE Place 1 spray into both nostrils daily as needed for allergies or rhinitis.   levocetirizine 5 MG tablet Commonly known as: XYZAL TAKE 1 TABLET BY MOUTH ONCE DAILY IN THE EVENING   losartan 100 MG tablet Commonly known as: COZAAR Take 1 tablet by mouth once daily   nitroGLYCERIN 0.4 MG SL tablet Commonly known as: NITROSTAT DISSOLVE ONE TABLET UNDER THE TONGUE EVERY 5 MINUTES AS NEEDED FOR CHEST PAIN.  DO NOT EXCEED A TOTAL OF 3 DOSES IN 15 MINUTES What changed:  how much to take how to take this when to take this reasons to take this additional instructions   ondansetron 4 MG disintegrating tablet Commonly known as: ZOFRAN-ODT Take 1 tablet (4 mg total) by mouth every 8 (eight) hours as needed for nausea or vomiting.   oxyCODONE 5 MG immediate release tablet Commonly known as:  Roxicodone Take 1 tablet (5 mg total) by mouth every 6 (six) hours as needed for up to 5 days for severe pain.   polyethylene glycol powder 17 GM/SCOOP powder Commonly known as: MiraLax Take 17 g by mouth 2 (two) times daily as needed for mild constipation.   ranolazine 500 MG 12 hr tablet Commonly known as: RANEXA Take 500 mg by mouth daily as needed (chest pain).   senna-docusate 8.6-50 MG tablet Commonly known as: Senokot-S Take 2  tablets by mouth 2 (two) times daily between meals as needed for moderate constipation.   Torsemide 60 MG Tabs Take 60 mg by mouth 2 (two) times daily. What changed:  medication strength how much to take   VITAMIN D-3 PO Take 1 capsule by mouth daily.        Consultations: Interventional radiology  Procedures/Studies: L3 and L4 kyphoplasty on 3/18.   IR KYPHO EA ADDL LEVEL THORACIC OR LUMBAR  Result Date: 11/23/2022 INDICATION: Low back pain secondary to compression fractures at L3 and L4. EXAM: BALLOON KYPHOPLASTY AT L3 AND L4 COMPARISON:  None Available. MEDICATIONS: As antibiotic prophylaxis, Ancef 2 g IV was ordered pre-procedure and administered intravenously within 1 hour of incision. All current medications are in the EMR and have been reviewed as part of this encounter. ANESTHESIA/SEDATION: Moderate (conscious) sedation was employed during this procedure. A total of Versed 3 mg and Fentanyl 125 mcg was administered intravenously by the radiology nurse. Total intra-service moderate Sedation Time: 40 minutes. The patient's level of consciousness and vital signs were monitored continuously by radiology nursing throughout the procedure under my direct supervision. FLUOROSCOPY: Radiation Exposure Index (as provided by the fluoroscopic device): 16 minutes, 30 seconds 314 mGy Kerma COMPLICATIONS: None immediate. PROCEDURE: Following a full explanation of the procedure along with the potential associated complications, an informed witnessed consent  was obtained. The patient was placed prone on the fluoroscopic table. The skin overlying the lumbar region was then prepped and draped in the usual sterile fashion. The right pedicle at L3 and both pedicles at L4 were then infiltrated with 0.25% bupivacaine followed by the advancement of an 11-gauge Jamshidi needle through the pedicles into the posterior one-third at L3 and L4. These were then exchanged for a Kyphon advanced osteo introducer system comprised of a working cannula and a Kyphon osteo drill. These combinations were then advanced over a Kyphon osteo bone pin until the tips of the Kyphon osteo drills were in the posterior third at L3 and L4. At this time, the bone pins were removed. In a medial trajectory, the combination was advanced until the tips of the working cannulae were inside the posterior one-third at L3 and L4. The osteo drills were removed. Through the working cannulae, a Kyphon inflatable bone tamp 20 x 5 were advanced and positioned with the distal markers 5 mm from the anterior aspect of L3 and L4. Cossing of the midline was seen on the AP projection. At this time, the balloons were expanded using contrast via a Kyphon inflation syringe device via microtubing. Inflations were continued until there was apposition with the superior endplate. At this time, methylmethacrylate mixture was reconstituted with Tobramycin in the Kyphon bone mixing device system. This was then loaded onto the Kyphon bone fillers. The balloons were deflated and removed followed by the instillation of 4 bone filler equivalents of methylmethacrylate mixture at L3, and 5 bone filler equivalents of methylmethacrylate mixture at L4 with excellent filling in the AP and lateral projections. No extravasation was noted in the disk spaces or posteriorly into the spinal canal. No epidural venous contamination was seen. The working cannulae and the bone fillers were then retrieved and removed. Hemostasis was achieved at the skin  entry sites. IMPRESSION: 1. Status post vertebral body augmentation using balloon kyphoplasty at L3 and L4 as described without event. If the patient has known osteoporosis, recommend treatment as clinically indicated. If the patient's bone density status is unknown, DEXA scan is recommended. Electronically Signed   By:  Luanne Bras M.D.   On: 11/23/2022 08:06   IR KYPHO LUMBAR INC FX REDUCE BONE BX UNI/BIL CANNULATION INC/IMAGING  Result Date: 11/23/2022 INDICATION: Low back pain secondary to compression fractures at L3 and L4. EXAM: BALLOON KYPHOPLASTY AT L3 AND L4 COMPARISON:  None Available. MEDICATIONS: As antibiotic prophylaxis, Ancef 2 g IV was ordered pre-procedure and administered intravenously within 1 hour of incision. All current medications are in the EMR and have been reviewed as part of this encounter. ANESTHESIA/SEDATION: Moderate (conscious) sedation was employed during this procedure. A total of Versed 3 mg and Fentanyl 125 mcg was administered intravenously by the radiology nurse. Total intra-service moderate Sedation Time: 40 minutes. The patient's level of consciousness and vital signs were monitored continuously by radiology nursing throughout the procedure under my direct supervision. FLUOROSCOPY: Radiation Exposure Index (as provided by the fluoroscopic device): 16 minutes, 30 seconds 123XX123 mGy Kerma COMPLICATIONS: None immediate. PROCEDURE: Following a full explanation of the procedure along with the potential associated complications, an informed witnessed consent was obtained. The patient was placed prone on the fluoroscopic table. The skin overlying the lumbar region was then prepped and draped in the usual sterile fashion. The right pedicle at L3 and both pedicles at L4 were then infiltrated with 0.25% bupivacaine followed by the advancement of an 11-gauge Jamshidi needle through the pedicles into the posterior one-third at L3 and L4. These were then exchanged for a Kyphon  advanced osteo introducer system comprised of a working cannula and a Kyphon osteo drill. These combinations were then advanced over a Kyphon osteo bone pin until the tips of the Kyphon osteo drills were in the posterior third at L3 and L4. At this time, the bone pins were removed. In a medial trajectory, the combination was advanced until the tips of the working cannulae were inside the posterior one-third at L3 and L4. The osteo drills were removed. Through the working cannulae, a Kyphon inflatable bone tamp 20 x 5 were advanced and positioned with the distal markers 5 mm from the anterior aspect of L3 and L4. Cossing of the midline was seen on the AP projection. At this time, the balloons were expanded using contrast via a Kyphon inflation syringe device via microtubing. Inflations were continued until there was apposition with the superior endplate. At this time, methylmethacrylate mixture was reconstituted with Tobramycin in the Kyphon bone mixing device system. This was then loaded onto the Kyphon bone fillers. The balloons were deflated and removed followed by the instillation of 4 bone filler equivalents of methylmethacrylate mixture at L3, and 5 bone filler equivalents of methylmethacrylate mixture at L4 with excellent filling in the AP and lateral projections. No extravasation was noted in the disk spaces or posteriorly into the spinal canal. No epidural venous contamination was seen. The working cannulae and the bone fillers were then retrieved and removed. Hemostasis was achieved at the skin entry sites. IMPRESSION: 1. Status post vertebral body augmentation using balloon kyphoplasty at L3 and L4 as described without event. If the patient has known osteoporosis, recommend treatment as clinically indicated. If the patient's bone density status is unknown, DEXA scan is recommended. Electronically Signed   By: Luanne Bras M.D.   On: 11/23/2022 08:06   MR LUMBAR SPINE WO CONTRAST  Result Date:  11/18/2022 CLINICAL DATA:  Back pain.  Lumbar compression fracture. EXAM: MRI LUMBAR SPINE WITHOUT CONTRAST TECHNIQUE: Multiplanar, multisequence MR imaging of the lumbar spine was performed. No intravenous contrast was administered. COMPARISON:  CT lumbar  spine 11/17/2022. Abdominopelvic CT 09/01/2022 and 07/16/2018. FINDINGS: Segmentation: Conventional anatomy assumed, with the last open disc space designated L5-S1.Concordant with prior imaging. Alignment: Stable chronic retrolisthesis and mild focal kyphosis at L1-2. Vertebrae: As seen on recent lumbar spine CT, there is a subacute inferior endplate compression fracture at L3 resulting in approximately 20% loss of vertebral body height and no osseous retropulsion. There is mild marrow edema inferiorly in the vertebral body. There is also a mild inferior endplate compression fracture at L4 which results in approximately 10% loss of vertebral body height. There is also associated with mild marrow edema. Chronic unchanged inferior endplate compression deformity and Schmorl's node at L1. No suspicious osseous lesions. Conus medullaris: Extends to the T12-L1 level and appears normal. Paraspinal and other soft tissues: No significant paraspinal findings. Disc levels: L1-2: Chronic degenerative disc disease with loss of disc height, annular disc bulging and endplate osteophytes asymmetric to the right. Chronic mild right lateral recess and right foraminal narrowing without nerve root encroachment. L2-3: Chronic spondylosis with loss of disc height, disc bulging and endplate osteophytes asymmetric to the left. Mild narrowing of the left lateral recess and left foramen without nerve root encroachment. L3-4: Chronic loss of disc height with annular disc bulging, endplate osteophytes, facet and ligamentous hypertrophy. Resulting mild multifactorial spinal stenosis with mild lateral recess and foraminal narrowing bilaterally. L4-5: Disc height and hydration are maintained.  There is mild disc bulging and moderate facet/ligamentous hypertrophy. Resulting moderate multifactorial spinal stenosis with mild lateral recess and foraminal narrowing bilaterally. L5-S1: Chronic loss of disc height with annular disc bulging and circumferential endplate osteophytes. Mild bilateral facet hypertrophy. Chronic mild right-greater-than-left foraminal narrowing. The spinal canal and lateral recesses are patent. IMPRESSION: 1. Subacute inferior endplate compression fractures at L3 and L4 with mild associated marrow edema. No osseous retropulsion. 2. Stable chronic inferior endplate compression deformity at L1. 3. Multilevel spondylosis with resulting mild multifactorial spinal stenosis at L3-4 and moderate spinal stenosis at L4-5. No high-grade foraminal narrowing or definite nerve root encroachment. Electronically Signed   By: Richardean Sale M.D.   On: 11/18/2022 16:57   DG Chest 1 View  Result Date: 11/18/2022 CLINICAL DATA:  Shortness of breath. EXAM: CHEST  1 VIEW COMPARISON:  10/29/2022 FINDINGS: The heart is mildly enlarged but stable. Stable tortuosity and calcification of the thoracic aorta. Mild chronic bronchitic type lung changes but no infiltrates, edema or effusions. No pulmonary lesions or pneumothorax. The bony thorax is intact. IMPRESSION: Mild cardiac enlargement and chronic lung changes but no acute pulmonary findings. Electronically Signed   By: Marijo Sanes M.D.   On: 11/18/2022 08:59   DG Hip Unilat With Pelvis 2-3 Views Right  Result Date: 11/18/2022 CLINICAL DATA:  Hip pain.  Known L3 compression fracture. EXAM: DG HIP (WITH OR WITHOUT PELVIS) 3V RIGHT COMPARISON:  Lumbar CT from yesterday FINDINGS: Generalized osteopenia. Both hips are located. No evidence of fracture or diastasis. IMPRESSION: No acute finding. Electronically Signed   By: Jorje Guild M.D.   On: 11/18/2022 06:53   CT Lumbar Spine Wo Contrast  Result Date: 11/17/2022 CLINICAL DATA:  Mid to low back  and bilateral sacroiliac joint pain. EXAM: CT LUMBAR SPINE WITHOUT CONTRAST TECHNIQUE: Multidetector CT imaging of the lumbar spine was performed without intravenous contrast administration. Multiplanar CT image reconstructions were also generated. RADIATION DOSE REDUCTION: This exam was performed according to the departmental dose-optimization program which includes automated exposure control, adjustment of the mA and/or kV according to patient size and/or  use of iterative reconstruction technique. COMPARISON:  CT abdomen pelvis dated September 01, 2022. FINDINGS: Segmentation: 5 lumbar type vertebrae. Alignment: Unchanged focal kyphosis at L1 with mild retrolisthesis at L1-L2. Vertebrae: New acute to subacute L3 inferior endplate compression fracture with up to 35% height loss. No retropulsion. Chronic L1 inferior endplate compression deformity. Osteopenia. Paraspinal and other soft tissues: Aortoiliac atherosclerotic vascular disease. Old granulomatous disease in the spleen. Disc levels: T12-L1:  Mild disc bulging.  No stenosis. L1-L2:  Mild retropulsion endplate spurring.  No stenosis. L2-L3: Minimal disc bulging and posterior endplate spurring. No stenosis. L3-L4: Mild disc bulging. Mild spinal canal and left neuroforaminal stenosis. No right neuroforaminal stenosis. L4-L5: Mild disc bulging and bilateral facet arthropathy. Prominent ligamentum flavum hypertrophy. Moderate spinal canal and mild right neuroforaminal stenosis. No left neuroforaminal stenosis. L5-S1: Severe disc height loss with small circumferential disc osteophyte complex and mild bilateral facet arthropathy. No stenosis. IMPRESSION: 1. New acute to subacute L3 inferior endplate compression fracture with up to 35% height loss. No retropulsion. 2. Chronic L1 inferior endplate compression deformity. 3. Multilevel lumbar spondylosis as described above. Moderate spinal canal stenosis at L4-L5. 4.  Aortic Atherosclerosis (ICD10-I70.0). Electronically  Signed   By: Titus Dubin M.D.   On: 11/17/2022 15:14   DG Chest 2 View  Result Date: 10/29/2022 CLINICAL DATA:  Chest pain EXAM: CHEST - 2 VIEW COMPARISON:  Chest x-ray 03/16/2022 x-ray and CT angiogram FINDINGS: Enlarged cardiopericardial silhouette with calcified tortuous aorta. Hyperinflation. Diffuse interstitial changes are again noted and likely chronic. No pneumothorax, effusion or consolidation. Osteopenia. Degenerative changes IMPRESSION: Hyperinflation with likely chronic interstitial changes. Enlarged cardiopericardial silhouette Electronically Signed   By: Jill Side M.D.   On: 10/29/2022 13:03       The results of significant diagnostics from this hospitalization (including imaging, microbiology, ancillary and laboratory) are listed below for reference.     Microbiology: No results found for this or any previous visit (from the past 240 hour(s)).   Labs:  CBC: Recent Labs  Lab 11/17/22 1151 11/18/22 0635 11/19/22 0253 11/20/22 0100 11/21/22 0025 11/22/22 0043 11/23/22 0135 11/24/22 0029  WBC 5.1 5.7   < > 5.7 6.6 5.4 6.2 5.9  NEUTROABS 3,259 3.8  --   --   --  2.6  --   --   HGB 10.4* 10.2*   < > 9.1* 8.8* 8.5* 8.9* 9.1*  HCT 32.0* 30.0*   < > 27.2* 27.2* 26.7* 26.6* 27.4*  MCV 92.5 91.7   < > 93.2 95.4 97.8 94.7 94.8  PLT 229 200   < > 169 154 168 191 198   < > = values in this interval not displayed.   BMP &GFR Recent Labs  Lab 11/20/22 0100 11/20/22 0706 11/21/22 0025 11/22/22 0043 11/23/22 0135 11/24/22 0029  NA 125* 126* 128* 129* 130* 131*  K  --  4.4 4.8 5.2* 4.6 4.7  CL  --  93* 98 94* 94* 97*  CO2  --  24 24 26 25 24   GLUCOSE  --  92 106* 106* 95 95  BUN  --  37* 34* 39* 38* 37*  CREATININE  --  1.05* 0.95 1.27* 1.13* 1.21*  CALCIUM  --  8.7* 8.7* 8.8* 9.1 9.2  MG 2.2  --  2.3 2.1 2.1 1.8  PHOS  --  2.6 2.8 3.2 3.3 3.1   Estimated Creatinine Clearance: 34.8 mL/min (A) (by C-G formula based on SCr of 1.21 mg/dL (H)). Liver &  Pancreas:  Recent Labs  Lab 11/17/22 1151 11/18/22 1350 11/20/22 0706 11/21/22 0025 11/22/22 0043 11/23/22 0135 11/24/22 0029  AST 14  --   --   --   --   --   --   ALT 7  --   --   --   --   --   --   BILITOT 0.6  --   --   --   --   --   --   PROT 6.9  --   --   --   --   --   --   ALBUMIN  --    < > 2.8* 2.7* 2.6* 2.7* 2.8*   < > = values in this interval not displayed.   No results for input(s): "LIPASE", "AMYLASE" in the last 168 hours. No results for input(s): "AMMONIA" in the last 168 hours. Diabetic: No results for input(s): "HGBA1C" in the last 72 hours. No results for input(s): "GLUCAP" in the last 168 hours. Cardiac Enzymes: No results for input(s): "CKTOTAL", "CKMB", "CKMBINDEX", "TROPONINI" in the last 168 hours. No results for input(s): "PROBNP" in the last 8760 hours. Coagulation Profile: Recent Labs  Lab 11/22/22 0043  INR 1.3*   Thyroid Function Tests: No results for input(s): "TSH", "T4TOTAL", "FREET4", "T3FREE", "THYROIDAB" in the last 72 hours. Lipid Profile: No results for input(s): "CHOL", "HDL", "LDLCALC", "TRIG", "CHOLHDL", "LDLDIRECT" in the last 72 hours. Anemia Panel: No results for input(s): "VITAMINB12", "FOLATE", "FERRITIN", "TIBC", "IRON", "RETICCTPCT" in the last 72 hours. Urine analysis:    Component Value Date/Time   COLORURINE STRAW (A) 11/18/2022 1006   APPEARANCEUR CLEAR 11/18/2022 1006   LABSPEC 1.006 11/18/2022 1006   PHURINE 5.0 11/18/2022 1006   GLUCOSEU NEGATIVE 11/18/2022 1006   HGBUR NEGATIVE 11/18/2022 1006   BILIRUBINUR NEGATIVE 11/18/2022 1006   KETONESUR NEGATIVE 11/18/2022 1006   PROTEINUR NEGATIVE 11/18/2022 1006   UROBILINOGEN 0.2 11/04/2013 1321   NITRITE NEGATIVE 11/18/2022 1006   LEUKOCYTESUR NEGATIVE 11/18/2022 1006   Sepsis Labs: Invalid input(s): "PROCALCITONIN", "LACTICIDVEN"   SIGNED:  Mercy Riding, MD  [facility] 11/24/2022, 10:53 AM

## 2022-11-24 NOTE — TOC Transition Note (Signed)
Transition of Care [facility] - CM/SW Discharge Note   Patient Details  Name: [name] MRN: [id number] Date of Birth: 1934-10-08  [facility] CM/SW Contact:  Bjorn Pippin, LCSW Phone Number: 11/24/2022, 12:44 PM   Clinical Narrative:    Patient will DC to: [facility]  Anticipated DC date: 11/24/2022 Family notified: Braulio Conte  Transport by: Corey Harold   Per MD patient ready for DC to [facility]. RN, patient, patient's family, and facility notified of DC. Discharge Summary and FL2 sent to facility. RN to call report prior to discharge [phone]. DC packet on chart. Ambulance transport requested for patient.   CSW will sign off for now as social work intervention is no longer needed. Please consult [place] again if new needs arise.    Final next level of care: [place] Barriers to Discharge: No Barriers Identified   Patient Goals and CMS Choice      Discharge Placement                Patient chooses bed at: [facility] Patient to be transferred to facility by: [place] Name of family member notified: Braulio Conte Patient and family notified of of transfer: 11/24/22  Discharge Plan and Services Additional resources added to the After Visit Summary for                                       Social Determinants of Health (SDOH) Interventions SDOH Screenings   Food Insecurity: No Food Insecurity (11/08/2022)  Housing: Low Risk  (11/08/2022)  Transportation Needs: No Transportation Needs (11/08/2022)  Utilities: Not At Risk (11/08/2022)  Alcohol Screen: Low Risk  (11/08/2022)  Depression (PHQ2-9): Low Risk  (11/08/2022)  Financial Resource Strain: Low Risk  (11/08/2022)  Recent Concern: Financial Resource Strain - Medium Risk (11/04/2022)  Physical Activity: Sufficiently Active (11/08/2022)  Recent Concern: Physical Activity - Insufficiently Active (08/11/2022)  Social Connections: Moderately Integrated (11/08/2022)  Recent Concern: Social  Connections - Moderately Isolated (08/11/2022)  Stress: No Stress Concern Present (11/08/2022)  Tobacco Use: Low Risk  (11/23/2022)     Readmission Risk Interventions     No data to display          Beckey Rutter, MSW, LCSWA, LCASA Transitions of Care  Clinical Social Worker I

## 2022-11-25 ENCOUNTER — Encounter: Payer: Self-pay | Admitting: *Deleted

## 2022-11-25 ENCOUNTER — Ambulatory Visit: Payer: Self-pay | Admitting: *Deleted

## 2022-11-25 NOTE — Patient Instructions (Signed)
Visit Information  Thank you for taking time to visit with me today. Please don't hesitate to contact me if I can be of assistance to you.   Following are the goals we discussed today:   Goals Addressed               This Visit's Progress     Provide Counseling & Supportive Services. (pt-stated)   On track     Care Coordination Interventions:  Interventions Today    Flowsheet Row Most Recent Value  Chronic Disease   Chronic disease during today's visit Atrial Fibrillation (AFib), Hypertension (HTN), Congestive Heart Failure (CHF), Chronic Kidney Disease/End Stage Renal Disease (ESRD), Chronic Obstructive Pulmonary Disease (COPD), Other  [Feelings of Sadness & Frustration Regarding Loss of Independence.]  General Interventions   General Interventions Discussed/Reviewed General Interventions Discussed, General Interventions Reviewed, Annual Eye Exam, Durable Medical Equipment (DME), Vaccines, Health Screening, Community Resources, Level of Care, Communication with, Doctor Visits  [Primary Care Provider]  Vaccines COVID-19, Flu, Pneumonia, RSV, Shingles, Tetanus/Pertussis/Diphtheria  [Encouraged]  Doctor Visits Discussed/Reviewed Doctor Visits Discussed, Doctor Visits Reviewed, Annual Wellness Visits, PCP, Specialist  [Encouraged]  Health Screening Bone Density, Colonoscopy, Mammogram  [Encouraged]  Durable Medical Equipment (DME) BP Cuff  PCP/Specialist Visits Compliance with follow-up visit  [Encouraged]  Communication with PCP/Specialists, RN  Level of Care Adult Daycare, Applications, Assisted Living, Personal Care Services  Applications Medicaid, Personal Care Services  Exercise Interventions   Exercise Discussed/Reviewed Exercise Discussed, Exercise Reviewed, Physical Activity, Weight Managment, Assistive device use and maintanence  [Encouraged]  Physical Activity Discussed/Reviewed Physical Activity Discussed, Physical Activity Reviewed, Types of exercise, Home Exercise Program  (HEP)  [Encouraged]  Weight Management Weight maintenance  [Encouraged]  Education Interventions   Education Provided Provided Engineer, site, Provided Education  Provided Verbal Education On Nutrition, Foot Care, Eye Care, Mental Health/Coping with Illness, Applications, Exercise, Medication, Insurance Plans, [employer], When to see the doctor  Applications Medicaid, [place] Discussed, Mental Health Reviewed, Coping Strategies, Suicide, Crisis, Substance Abuse, Grief and Loss, Depression, Anxiety  Refer to Social Work for counseling regarding Anxiety/Coping, Depression, Grief and Loss  Nutrition Interventions   Nutrition Discussed/Reviewed Nutrition Discussed, Nutrition Reviewed, Portion sizes, Decreasing sugar intake, Decreasing salt, Decreasing fats, Increaing proteins, Fluid intake, Adding fruits and vegetables  [Encouraged]  Pharmacy Interventions   Pharmacy Dicussed/Reviewed Pharmacy Topics Discussed, Pharmacy Topics Reviewed, Medication Adherence, Affording Medications  Safety Interventions   Safety Discussed/Reviewed Safety Discussed, Safety Reviewed, Fall Risk, Home Safety  [Encouraged]  Home Herbalist, Refer for community resources  Advanced Directive Interventions   Advanced Directives Discussed/Reviewed Advanced Directives Discussed, Advanced Directives Reviewed     Active Listening & Reflection Utilized.  Verbalization of Feelings Encouraged.  Emotional Support Offered. Feelings of Grief Validated, with Regards to Loss of Husband 01/2022. Grief & Loss Support Groups Discussed. Self-Enrollment in Grief & Loss Support Group Emphasized, from List Provided. Problem Solving Interventions Implemented. Task-Centered Solutions Activated.   Solution-Focused Strategies Developed. Acceptance & Commitment Therapy Indicated. Brief Cognitive Behavioral Therapy  Initiated. Client-Centered Therapy Enacted. Continue to Receive Short-Term Rehabilitative Services at [place] [phone]), Admitted on 11/24/2022. CSW Collaboration with Paediatric nurse at [place] [phone]), to Introduce Self, Explain Role & Types of Services Provided, As Well As Offer Assistance with Discharge Planning Needs. Thoroughly Reviewed & Encouraged Tree surgeon with  The Following List of Express Scripts, Community education officer of Interest: ~ Adult Day Care Programs  ~ [place] ~ [place] ~ [place] ~ [place] of [place] Encouraged to [employer] CSW Directly [phone]), if You Have Questions, Need Assistance, or If Additional Social Work Needs Are Identified Between Now & Our Next Scheduled Telephone [employer].      Our next appointment is by telephone on 12/01/2022 at 9:00 am.  Please call the care guide team at [phone] if you need to cancel or reschedule your appointment.   If you are experiencing a Mental Health or [place] or need someone to talk to, please call the Suicide and Crisis Lifeline: 988 call the [place] National Suicide Prevention Lifeline: (316)657-3117 or TTY: 506-335-2647 TTY (714) 250-2019) to talk to a trained counselor call 1-800-273-TALK (toll free, 24 hour hotline) go to [facility] [address], [place] [phone]) call the [place]: [phone] call 911  Patient verbalizes understanding of instructions and care plan provided today and agrees to view in [place]. Active MyChart status and patient understanding of how to access instructions and care plan via MyChart confirmed with patient.     Telephone follow up  appointment with care management team member scheduled for:  12/01/2022 at 9:00 am.    Nat Christen, BSW, MSW, [place]  Licensed Clinical Social Worker  [address]  Mailing [place]. [address] [place], [place] [address] Physical Address-[address], [place], [place] [address] Toll Free Main # [phone] Fax # [phone] Cell # [phone] [email or web address]

## 2022-11-25 NOTE — Patient Outreach (Signed)
Care Coordination   Follow Up Visit Note   11/25/2022  Name: [name] MRN: [id number] DOB: Sep 24, 1934  [name] is a 88 y.o. year old female who sees [name], Cammie Mcgee, MD for primary care. I spoke with [name] by phone today.  What matters to the patients health and wellness today?   Provide Counseling & Supportive Services.   Goals Addressed               This Visit's Progress     Provide Counseling & Supportive Services. (pt-stated)   On track     Care Coordination Interventions:  Interventions Today    Flowsheet Row Most Recent Value  Chronic Disease   Chronic disease during today's visit Atrial Fibrillation (AFib), Hypertension (HTN), Congestive Heart Failure (CHF), Chronic Kidney Disease/End Stage Renal Disease (ESRD), Chronic Obstructive Pulmonary Disease (COPD), Other  [Feelings of Sadness & Frustration Regarding Loss of Independence.]  General Interventions   General Interventions Discussed/Reviewed General Interventions Discussed, General Interventions Reviewed, Annual Eye Exam, Durable Medical Equipment (DME), Vaccines, Health Screening, Community Resources, Level of Care, Communication with, Doctor Visits  [Primary Care Provider]  Vaccines COVID-19, Flu, Pneumonia, RSV, Shingles, Tetanus/Pertussis/Diphtheria  [Encouraged]  Doctor Visits Discussed/Reviewed Doctor Visits Discussed, Doctor Visits Reviewed, Annual Wellness Visits, PCP, Specialist  [Encouraged]  Health Screening Bone Density, Colonoscopy, Mammogram  [Encouraged]  Durable Medical Equipment (DME) BP Cuff  PCP/Specialist Visits Compliance with follow-up visit  [Encouraged]  Communication with PCP/Specialists, RN  Level of Care Adult Daycare, Applications, Assisted Living, Personal Care Services  Applications Medicaid, Personal Care Services  Exercise Interventions   Exercise Discussed/Reviewed Exercise Discussed, Exercise Reviewed, Physical Activity, Weight Managment, Assistive device use and  maintanence  [Encouraged]  Physical Activity Discussed/Reviewed Physical Activity Discussed, Physical Activity Reviewed, Types of exercise, Home Exercise Program (HEP)  [Encouraged]  Weight Management Weight maintenance  [Encouraged]  Education Interventions   Education Provided Provided Engineer, site, Provided Education  Provided Verbal Education On Nutrition, Foot Care, Eye Care, Mental Health/Coping with Illness, Applications, Exercise, Medication, Insurance Plans, [employer], When to see the doctor  Applications Medicaid, [place] Discussed, Mental Health Reviewed, Coping Strategies, Suicide, Crisis, Substance Abuse, Grief and Loss, Depression, Anxiety  Refer to Social Work for counseling regarding Anxiety/Coping, Depression, Grief and Loss  Nutrition Interventions   Nutrition Discussed/Reviewed Nutrition Discussed, Nutrition Reviewed, Portion sizes, Decreasing sugar intake, Decreasing salt, Decreasing fats, Increaing proteins, Fluid intake, Adding fruits and vegetables  [Encouraged]  Pharmacy Interventions   Pharmacy Dicussed/Reviewed Pharmacy Topics Discussed, Pharmacy Topics Reviewed, Medication Adherence, Affording Medications  Safety Interventions   Safety Discussed/Reviewed Safety Discussed, Safety Reviewed, Fall Risk, Home Safety  [Encouraged]  Home Herbalist, Refer for community resources  Advanced Directive Interventions   Advanced Directives Discussed/Reviewed Advanced Directives Discussed, Advanced Directives Reviewed     Active Listening & Reflection Utilized.  Verbalization of Feelings Encouraged.  Emotional Support Offered. Feelings of Grief Validated, with Regards to Loss of Husband 01/2022. Grief & Loss Support Groups Discussed. Self-Enrollment in Grief & Loss Support Group Emphasized, from List Provided. Problem Solving Interventions  Implemented. Task-Centered Solutions Activated.   Solution-Focused Strategies Developed. Acceptance & Commitment Therapy Indicated. Brief Cognitive Behavioral Therapy Initiated. Client-Centered Therapy Enacted. Continue to Receive Short-Term Rehabilitative Services at [place] [phone]), Admitted on 11/24/2022. CSW Ambulance person with Paediatric nurse at [employer] of  [facility] [phone]), to Introduce Self, Explain Role & Types of Services Provided, As Well As Offer Assistance with Discharge Planning Needs. Thoroughly Reviewed & Encouraged Tree surgeon with The Following List of Express Scripts, Services & Resources of Interest: ~ Adult Day Care Programs  ~ [place] ~ [place] ~ [place] ~ [place] of [place] Encouraged to [employer] CSW Directly [phone]), if You Have Questions, Need Assistance, or If Additional Social Work Needs Are Identified Between Now & Our Next Scheduled Telephone [employer].      SDOH assessments and interventions completed:  Yes.  Care Coordination Interventions:  Yes, provided.   Follow up plan: Follow up call scheduled for 12/01/2022 at 9:00 am.  Encounter Outcome:  Pt. Visit Completed.   Nat Christen, BSW, MSW, LCSW  Licensed Education officer, environmental Health System  Mailing [place] [place], [place] [address] Physical Address-[address] [place], [place] [address] Toll Free Main # [phone] Fax # [phone] Cell # [phone] [email or web address]

## 2022-11-26 DIAGNOSIS — M6281 Muscle weakness (generalized): Secondary | ICD-10-CM | POA: Diagnosis not present

## 2022-11-26 DIAGNOSIS — S32030D Wedge compression fracture of third lumbar vertebra, subsequent encounter for fracture with routine healing: Secondary | ICD-10-CM | POA: Diagnosis not present

## 2022-11-26 DIAGNOSIS — I2510 Atherosclerotic heart disease of native coronary artery without angina pectoris: Secondary | ICD-10-CM | POA: Diagnosis not present

## 2022-11-26 DIAGNOSIS — R54 Age-related physical debility: Secondary | ICD-10-CM | POA: Diagnosis not present

## 2022-11-30 DIAGNOSIS — M25551 Pain in right hip: Secondary | ICD-10-CM | POA: Diagnosis not present

## 2022-11-30 DIAGNOSIS — R54 Age-related physical debility: Secondary | ICD-10-CM | POA: Diagnosis not present

## 2022-11-30 DIAGNOSIS — S32030D Wedge compression fracture of third lumbar vertebra, subsequent encounter for fracture with routine healing: Secondary | ICD-10-CM | POA: Diagnosis not present

## 2022-11-30 DIAGNOSIS — I2510 Atherosclerotic heart disease of native coronary artery without angina pectoris: Secondary | ICD-10-CM | POA: Diagnosis not present

## 2022-12-01 ENCOUNTER — Telehealth (HOSPITAL_COMMUNITY): Payer: Self-pay

## 2022-12-01 ENCOUNTER — Encounter (HOSPITAL_COMMUNITY): Payer: Medicare Other

## 2022-12-01 ENCOUNTER — Encounter: Payer: Medicare Other | Admitting: *Deleted

## 2022-12-01 NOTE — Telephone Encounter (Signed)
Spoke to [employer] from [facility]. She called to schedule a f/u post KP. Informed her that pt does not need a f/u if she is feeling better. She is not having any issues at this time and will hold off. I told her to give [place] a call if things change. AB

## 2022-12-03 DIAGNOSIS — J440 Chronic obstructive pulmonary disease with acute lower respiratory infection: Secondary | ICD-10-CM | POA: Diagnosis not present

## 2022-12-03 DIAGNOSIS — J309 Allergic rhinitis, unspecified: Secondary | ICD-10-CM | POA: Diagnosis not present

## 2022-12-03 DIAGNOSIS — S32030D Wedge compression fracture of third lumbar vertebra, subsequent encounter for fracture with routine healing: Secondary | ICD-10-CM | POA: Diagnosis not present

## 2022-12-03 DIAGNOSIS — U071 COVID-19: Secondary | ICD-10-CM | POA: Diagnosis not present

## 2022-12-06 DIAGNOSIS — R052 Subacute cough: Secondary | ICD-10-CM | POA: Diagnosis not present

## 2022-12-06 DIAGNOSIS — R110 Nausea: Secondary | ICD-10-CM | POA: Diagnosis not present

## 2022-12-06 DIAGNOSIS — S32030D Wedge compression fracture of third lumbar vertebra, subsequent encounter for fracture with routine healing: Secondary | ICD-10-CM | POA: Diagnosis not present

## 2022-12-06 DIAGNOSIS — I5032 Chronic diastolic (congestive) heart failure: Secondary | ICD-10-CM | POA: Diagnosis not present

## 2022-12-10 DIAGNOSIS — I110 Hypertensive heart disease with heart failure: Secondary | ICD-10-CM | POA: Diagnosis not present

## 2022-12-10 DIAGNOSIS — N1832 Chronic kidney disease, stage 3b: Secondary | ICD-10-CM | POA: Diagnosis not present

## 2022-12-10 DIAGNOSIS — I5032 Chronic diastolic (congestive) heart failure: Secondary | ICD-10-CM | POA: Diagnosis not present

## 2022-12-10 DIAGNOSIS — E871 Hypo-osmolality and hyponatremia: Secondary | ICD-10-CM | POA: Diagnosis not present

## 2022-12-12 DIAGNOSIS — S32030D Wedge compression fracture of third lumbar vertebra, subsequent encounter for fracture with routine healing: Secondary | ICD-10-CM | POA: Diagnosis not present

## 2022-12-12 DIAGNOSIS — U071 COVID-19: Secondary | ICD-10-CM | POA: Diagnosis not present

## 2022-12-12 DIAGNOSIS — I4819 Other persistent atrial fibrillation: Secondary | ICD-10-CM | POA: Diagnosis not present

## 2022-12-12 DIAGNOSIS — I5181 Takotsubo syndrome: Secondary | ICD-10-CM | POA: Diagnosis not present

## 2022-12-12 DIAGNOSIS — J309 Allergic rhinitis, unspecified: Secondary | ICD-10-CM | POA: Diagnosis not present

## 2022-12-12 DIAGNOSIS — I130 Hypertensive heart and chronic kidney disease with heart failure and stage 1 through stage 4 chronic kidney disease, or unspecified chronic kidney disease: Secondary | ICD-10-CM | POA: Diagnosis not present

## 2022-12-12 DIAGNOSIS — J449 Chronic obstructive pulmonary disease, unspecified: Secondary | ICD-10-CM | POA: Diagnosis not present

## 2022-12-12 DIAGNOSIS — M47816 Spondylosis without myelopathy or radiculopathy, lumbar region: Secondary | ICD-10-CM | POA: Diagnosis not present

## 2022-12-12 DIAGNOSIS — I350 Nonrheumatic aortic (valve) stenosis: Secondary | ICD-10-CM | POA: Diagnosis not present

## 2022-12-12 DIAGNOSIS — F419 Anxiety disorder, unspecified: Secondary | ICD-10-CM | POA: Diagnosis not present

## 2022-12-12 DIAGNOSIS — M48061 Spinal stenosis, lumbar region without neurogenic claudication: Secondary | ICD-10-CM | POA: Diagnosis not present

## 2022-12-12 DIAGNOSIS — I5030 Unspecified diastolic (congestive) heart failure: Secondary | ICD-10-CM | POA: Diagnosis not present

## 2022-12-12 DIAGNOSIS — D509 Iron deficiency anemia, unspecified: Secondary | ICD-10-CM | POA: Diagnosis not present

## 2022-12-12 DIAGNOSIS — N1832 Chronic kidney disease, stage 3b: Secondary | ICD-10-CM | POA: Diagnosis not present

## 2022-12-12 DIAGNOSIS — M1990 Unspecified osteoarthritis, unspecified site: Secondary | ICD-10-CM | POA: Diagnosis not present

## 2022-12-12 DIAGNOSIS — I2510 Atherosclerotic heart disease of native coronary artery without angina pectoris: Secondary | ICD-10-CM | POA: Diagnosis not present

## 2022-12-13 ENCOUNTER — Encounter: Payer: Self-pay | Admitting: *Deleted

## 2022-12-13 ENCOUNTER — Ambulatory Visit: Payer: Self-pay | Admitting: *Deleted

## 2022-12-13 DIAGNOSIS — J449 Chronic obstructive pulmonary disease, unspecified: Secondary | ICD-10-CM | POA: Diagnosis not present

## 2022-12-13 DIAGNOSIS — M48061 Spinal stenosis, lumbar region without neurogenic claudication: Secondary | ICD-10-CM | POA: Diagnosis not present

## 2022-12-13 DIAGNOSIS — I5181 Takotsubo syndrome: Secondary | ICD-10-CM | POA: Diagnosis not present

## 2022-12-13 DIAGNOSIS — D509 Iron deficiency anemia, unspecified: Secondary | ICD-10-CM | POA: Diagnosis not present

## 2022-12-13 DIAGNOSIS — U071 COVID-19: Secondary | ICD-10-CM | POA: Diagnosis not present

## 2022-12-13 DIAGNOSIS — I2510 Atherosclerotic heart disease of native coronary artery without angina pectoris: Secondary | ICD-10-CM | POA: Diagnosis not present

## 2022-12-13 DIAGNOSIS — I350 Nonrheumatic aortic (valve) stenosis: Secondary | ICD-10-CM | POA: Diagnosis not present

## 2022-12-13 DIAGNOSIS — I5030 Unspecified diastolic (congestive) heart failure: Secondary | ICD-10-CM | POA: Diagnosis not present

## 2022-12-13 DIAGNOSIS — M1990 Unspecified osteoarthritis, unspecified site: Secondary | ICD-10-CM | POA: Diagnosis not present

## 2022-12-13 DIAGNOSIS — F419 Anxiety disorder, unspecified: Secondary | ICD-10-CM | POA: Diagnosis not present

## 2022-12-13 DIAGNOSIS — J309 Allergic rhinitis, unspecified: Secondary | ICD-10-CM | POA: Diagnosis not present

## 2022-12-13 DIAGNOSIS — I4819 Other persistent atrial fibrillation: Secondary | ICD-10-CM | POA: Diagnosis not present

## 2022-12-13 DIAGNOSIS — N1832 Chronic kidney disease, stage 3b: Secondary | ICD-10-CM | POA: Diagnosis not present

## 2022-12-13 DIAGNOSIS — M47816 Spondylosis without myelopathy or radiculopathy, lumbar region: Secondary | ICD-10-CM | POA: Diagnosis not present

## 2022-12-13 DIAGNOSIS — S32030D Wedge compression fracture of third lumbar vertebra, subsequent encounter for fracture with routine healing: Secondary | ICD-10-CM | POA: Diagnosis not present

## 2022-12-13 DIAGNOSIS — I130 Hypertensive heart and chronic kidney disease with heart failure and stage 1 through stage 4 chronic kidney disease, or unspecified chronic kidney disease: Secondary | ICD-10-CM | POA: Diagnosis not present

## 2022-12-13 NOTE — Patient Outreach (Signed)
Care Coordination   Follow Up Visit Note   12/13/2022  Name: [name] MRN: [id number] DOB: 1935/01/03  [name] is a 88 y.o. year old female who sees [name], [name], MD for primary care. I spoke with [name] by phone today.  What matters to the patients health and wellness today?   Provide Counseling & Supportive Services.   Goals Addressed               This Visit's Progress     Provide Counseling & Supportive Services. (pt-stated)   On track     Care Coordination Interventions:  Interventions Today    Flowsheet Row Most Recent Value  Chronic Disease   Chronic disease during today's visit Atrial Fibrillation (AFib), Hypertension (HTN), Congestive Heart Failure (CHF), Chronic Kidney Disease/End Stage Renal Disease (ESRD), Chronic Obstructive Pulmonary Disease (COPD), Other  [Feelings of Sadness & Frustration Regarding Loss of Independence.]  General Interventions   General Interventions Discussed/Reviewed General Interventions Discussed, General Interventions Reviewed, Annual Eye Exam, Durable Medical Equipment (DME), Vaccines, Health Screening, Community Resources, Level of Care, Communication with, Doctor Visits  [Primary Care Provider]  Vaccines COVID-19, Flu, Pneumonia, RSV, Shingles, Tetanus/Pertussis/Diphtheria  [Encouraged]  Doctor Visits Discussed/Reviewed Doctor Visits Discussed, Doctor Visits Reviewed, Annual Wellness Visits, PCP, Specialist  [Encouraged]  Health Screening Bone Density, Colonoscopy, Mammogram  [Encouraged]  Durable Medical Equipment (DME) BP Cuff  PCP/Specialist Visits Compliance with follow-up visit  [Encouraged]  Communication with PCP/Specialists, RN  Level of Care Adult Daycare, Applications, Assisted Living, Personal Care Services  Applications Medicaid, Personal Care Services  Exercise Interventions   Exercise Discussed/Reviewed Exercise Discussed, Exercise Reviewed, Physical Activity, Weight Managment, Assistive device use and  maintanence  [Encouraged]  Physical Activity Discussed/Reviewed Physical Activity Discussed, Physical Activity Reviewed, Types of exercise, Home Exercise Program (HEP)  [Encouraged]  Weight Management Weight maintenance  [Encouraged]  Education Interventions   Education Provided Provided Therapist, sports, Provided Education  Provided Verbal Education On Nutrition, Foot Care, Eye Care, Mental Health/Coping with Illness, Applications, Exercise, Medication, Insurance Plans, [employer], When to see the doctor  Applications Medicaid, Personal Care Services  Mental Health Interventions   Mental Health Discussed/Reviewed Mental Health Discussed, Mental Health Reviewed, Coping Strategies, Suicide, Crisis, Substance Abuse, Grief and Loss, Depression, Anxiety  Refer to Social Work for counseling regarding Anxiety/Coping, Depression, Grief and Loss  Nutrition Interventions   Nutrition Discussed/Reviewed Nutrition Discussed, Nutrition Reviewed, Portion sizes, Decreasing sugar intake, Decreasing salt, Decreasing fats, Increaing proteins, Fluid intake, Adding fruits and vegetables  [Encouraged]  Pharmacy Interventions   Pharmacy Dicussed/Reviewed Pharmacy Topics Discussed, Pharmacy Topics Reviewed, Medication Adherence, Affording Medications  Safety Interventions   Safety Discussed/Reviewed Safety Discussed, Safety Reviewed, Fall Risk, Home Safety  [Encouraged]  Home Engineer, water, Refer for community resources  Advanced Directive Interventions   Advanced Directives Discussed/Reviewed Advanced Directives Discussed, Advanced Directives Reviewed     Active Listening & Reflection Utilized.  Verbalization of Feelings Encouraged.  Emotional Support Offered. Feelings of Grief Validated. Symptoms of Grief Acknowledged. Grief & Loss Support Groups Reviewed. Self-Enrollment in Grief & Loss Support Group Emphasized. Problem Solving Interventions Implemented. Task-Centered Solutions  Activated.   Solution-Focused Strategies Employed. Acceptance & Commitment Therapy Indicated. Cognitive Behavioral Therapy Initiated. Client-Centered Therapy Performed. Confirmed Discharge from [facility] [phone]), Where You Were Receiving Short-Term Rehabilitative Services, on 12/11/2022. CSW Collaboration with Interior and spatial designer at [facility] [place] [phone]), to  Confirm Home Health Physical Therapy & [facility], Arranged through [facility] [phone]). CSW Collaboration with Receptionist at [facility] [phone]), to [facility] Physical Therapy Services to Begin on 12/13/2022 & [place] [facility] Services to Begin on 12/14/2022. CSW Collaboration with Primary Care Provider, Dr. Lynnea Ferrier with [facility] [phone]), to Request Prescription Refill for Oxycodone, Per Patient's Request, Due to Extreme Right Hip & Leg Pain. Please Keep Follow-Up Appointment with Primary Care Provider, Dr. Lynnea Ferrier with [facility] [phone]), Scheduled on 12/16/2022 at 2:00 PM. Please Contact The Following List of [employer], Services & Resources of Interest, in An Effort to Obtain Care & Supervision in The Home: ~ Adult Day Care Programs  ~ In-Home Care & Respite Agencies ~ Home Health Care Agencies ~ Respite Care Agencies & Facilities ~ Aging, Disability & Transit Services of [facility] Please Contact CSW Directly [phone]), if You Have Questions, Need Assistance, or If Additional Social Work Needs Are Identified Between Now & Our Next Scheduled Telephone [employer].      SDOH assessments and interventions completed:  Yes.  Care Coordination Interventions:  Yes, provided.   Follow up plan: Follow up call scheduled for 12/27/2022 at  11:15 am.   Encounter Outcome:  Pt. Visit Completed.   Danford Bad, BSW, MSW, LCSW  Licensed Restaurant manager, fast food Health System  Mailing [place], [place], [place] [address] Physical Address[address], [place], [place] [address] Toll Free Main # [phone] Fax # [phone] Cell # [phone] [email or web address]

## 2022-12-13 NOTE — Patient Instructions (Signed)
Visit Information  Thank you for taking time to visit with me today. Please don't hesitate to contact me if I can be of assistance to you.   Following are the goals we discussed today:   Goals Addressed               This Visit's Progress     Provide Counseling & Supportive Services. (pt-stated)   On track     Care Coordination Interventions:  Interventions Today    Flowsheet Row Most Recent Value  Chronic Disease   Chronic disease during today's visit Atrial Fibrillation (AFib), Hypertension (HTN), Congestive Heart Failure (CHF), Chronic Kidney Disease/End Stage Renal Disease (ESRD), Chronic Obstructive Pulmonary Disease (COPD), Other  [Feelings of Sadness & Frustration Regarding Loss of Independence.]  General Interventions   General Interventions Discussed/Reviewed General Interventions Discussed, General Interventions Reviewed, Annual Eye Exam, Durable Medical Equipment (DME), Vaccines, Health Screening, Community Resources, Level of Care, Communication with, Doctor Visits  [Primary Care Provider]  Vaccines COVID-19, Flu, Pneumonia, RSV, Shingles, Tetanus/Pertussis/Diphtheria  [Encouraged]  Doctor Visits Discussed/Reviewed Doctor Visits Discussed, Doctor Visits Reviewed, Annual Wellness Visits, PCP, Specialist  [Encouraged]  Health Screening Bone Density, Colonoscopy, Mammogram  [Encouraged]  Durable Medical Equipment (DME) BP Cuff  PCP/Specialist Visits Compliance with follow-up visit  [Encouraged]  Communication with PCP/Specialists, RN  Level of Care Adult Daycare, Applications, Assisted Living, Personal Care Services  Applications Medicaid, Personal Care Services  Exercise Interventions   Exercise Discussed/Reviewed Exercise Discussed, Exercise Reviewed, Physical Activity, Weight Managment, Assistive device use and maintanence  [Encouraged]  Physical Activity Discussed/Reviewed Physical Activity Discussed, Physical Activity Reviewed, Types of exercise, Home Exercise Program  (HEP)  [Encouraged]  Weight Management Weight maintenance  [Encouraged]  Education Interventions   Education Provided Provided Therapist, sports, Provided Education  Provided Verbal Education On Nutrition, Foot Care, Eye Care, Mental Health/Coping with Illness, Applications, Exercise, Medication, Insurance Plans, [employer], When to see the doctor  Applications Medicaid, Personal Care Services  Mental Health Interventions   Mental Health Discussed/Reviewed Mental Health Discussed, Mental Health Reviewed, Coping Strategies, Suicide, Crisis, Substance Abuse, Grief and Loss, Depression, Anxiety  Refer to Social Work for counseling regarding Anxiety/Coping, Depression, Grief and Loss  Nutrition Interventions   Nutrition Discussed/Reviewed Nutrition Discussed, Nutrition Reviewed, Portion sizes, Decreasing sugar intake, Decreasing salt, Decreasing fats, Increaing proteins, Fluid intake, Adding fruits and vegetables  [Encouraged]  Pharmacy Interventions   Pharmacy Dicussed/Reviewed Pharmacy Topics Discussed, Pharmacy Topics Reviewed, Medication Adherence, Affording Medications  Safety Interventions   Safety Discussed/Reviewed Safety Discussed, Safety Reviewed, Fall Risk, Home Safety  [Encouraged]  Home Engineer, water, Refer for community resources  Advanced Directive Interventions   Advanced Directives Discussed/Reviewed Advanced Directives Discussed, Advanced Directives Reviewed     Active Listening & Reflection Utilized.  Verbalization of Feelings Encouraged.  Emotional Support Offered. Feelings of Grief Validated. Symptoms of Grief Acknowledged. Grief & Loss Support Groups Reviewed. Self-Enrollment in Grief & Loss Support Group Emphasized. Problem Solving Interventions Implemented. Task-Centered Solutions Activated.   Solution-Focused Strategies Employed. Acceptance & Commitment Therapy Indicated. Cognitive Behavioral Therapy Initiated. Client-Centered Therapy  Performed. Confirmed Discharge from [facility] [phone]), Where You Were Receiving Short-Term Rehabilitative Services, on 12/11/2022. CSW Collaboration with Interior and spatial designer at [facility] [place] [phone]), to [facility] Physical Therapy & [facility] Arranged through [facility] [phone]). CSW Collaboration with Receptionist at [facility] [phone]), to [facility]  Physical Therapy Services to Begin on 12/13/2022 & [facility] Services to Begin on 12/14/2022. CSW Collaboration with Primary Care Provider, Dr. Lynnea Ferrier with [facility] [phone]), to Request Prescription Refill for Oxycodone, Per Patient's Request, Due to Extreme Right Hip & Leg Pain. Please Keep Follow-Up Appointment with Primary Care Provider, Dr. Lynnea Ferrier with [facility] [phone]), Scheduled on 12/16/2022 at 2:00 PM. Please Contact The Following List of [employer], Services & Resources of Interest, in An Effort to Obtain Care & Supervision in The Home: ~ Adult Day Care Programs  ~ In-Home Care & Respite Agencies ~ Home Health Care Agencies ~ Respite Care Agencies & Facilities ~ Aging, Disability & Transit Services of [facility] Please Contact CSW Directly [phone]), if You Have Questions, Need Assistance, or If Additional Social Work Needs Are Identified Between Now & Our Next Scheduled Telephone [employer].      Our next appointment is by telephone on 12/27/2022 at 11:15 am.  Please call the care guide team at [phone] if you need to cancel or reschedule your appointment.   If you are experiencing a Mental Health or Behavioral Health Crisis or need someone to talk to, please call the Suicide and Crisis Lifeline: 988 call the  [place] National Suicide Prevention Lifeline: 902 103 9058 or TTY: 916-710-3330 TTY (774) 647-0037) to talk to a trained counselor call 1-800-273-TALK (toll free, 24 hour hotline) go to [facility] [address], [place] [phone]) call the [facility] Crisis Line: [phone] call 911  Patient verbalizes understanding of instructions and care plan provided today and agrees to view in MyChart. Active MyChart status and patient understanding of how to access instructions and care plan via MyChart confirmed with patient.     Telephone follow up appointment with care management team member scheduled for:  12/27/2022 at 11:15 am.  Danford Bad, BSW, MSW, LCSW  Licensed Clinical Social Worker  [facility]  Mailing [place]. [address] [place], [place] [address] Physical Address-[address] [place], [place] [address] Toll Free Main # [phone] Fax # [phone] Cell # [phone] [email or web address]

## 2022-12-14 ENCOUNTER — Ambulatory Visit (HOSPITAL_COMMUNITY)
Admission: RE | Admit: 2022-12-14 | Discharge: 2022-12-14 | Disposition: A | Discharge status: Home / Self Care | Payer: Medicare Other | Source: Ambulatory Visit | Attending: Family Medicine | Admitting: Family Medicine

## 2022-12-14 ENCOUNTER — Encounter (HOSPITAL_COMMUNITY): Payer: Self-pay

## 2022-12-14 VITALS — BP 174/62 | HR 58 | Wt 172.8 lb

## 2022-12-14 DIAGNOSIS — J449 Chronic obstructive pulmonary disease, unspecified: Secondary | ICD-10-CM | POA: Diagnosis not present

## 2022-12-14 DIAGNOSIS — I5032 Chronic diastolic (congestive) heart failure: Secondary | ICD-10-CM | POA: Insufficient documentation

## 2022-12-14 DIAGNOSIS — I5181 Takotsubo syndrome: Secondary | ICD-10-CM | POA: Diagnosis not present

## 2022-12-14 DIAGNOSIS — I5030 Unspecified diastolic (congestive) heart failure: Secondary | ICD-10-CM | POA: Diagnosis not present

## 2022-12-14 DIAGNOSIS — M48061 Spinal stenosis, lumbar region without neurogenic claudication: Secondary | ICD-10-CM | POA: Diagnosis not present

## 2022-12-14 DIAGNOSIS — I130 Hypertensive heart and chronic kidney disease with heart failure and stage 1 through stage 4 chronic kidney disease, or unspecified chronic kidney disease: Secondary | ICD-10-CM | POA: Diagnosis not present

## 2022-12-14 DIAGNOSIS — J309 Allergic rhinitis, unspecified: Secondary | ICD-10-CM | POA: Diagnosis not present

## 2022-12-14 DIAGNOSIS — M1990 Unspecified osteoarthritis, unspecified site: Secondary | ICD-10-CM | POA: Diagnosis not present

## 2022-12-14 DIAGNOSIS — R5381 Other malaise: Secondary | ICD-10-CM

## 2022-12-14 DIAGNOSIS — I482 Chronic atrial fibrillation, unspecified: Secondary | ICD-10-CM | POA: Diagnosis not present

## 2022-12-14 DIAGNOSIS — Z7901 Long term (current) use of anticoagulants: Secondary | ICD-10-CM | POA: Diagnosis not present

## 2022-12-14 DIAGNOSIS — D509 Iron deficiency anemia, unspecified: Secondary | ICD-10-CM | POA: Diagnosis not present

## 2022-12-14 DIAGNOSIS — N1832 Chronic kidney disease, stage 3b: Secondary | ICD-10-CM | POA: Diagnosis not present

## 2022-12-14 DIAGNOSIS — Z79899 Other long term (current) drug therapy: Secondary | ICD-10-CM | POA: Insufficient documentation

## 2022-12-14 DIAGNOSIS — I2510 Atherosclerotic heart disease of native coronary artery without angina pectoris: Secondary | ICD-10-CM | POA: Insufficient documentation

## 2022-12-14 DIAGNOSIS — Z87440 Personal history of urinary (tract) infections: Secondary | ICD-10-CM | POA: Diagnosis not present

## 2022-12-14 DIAGNOSIS — M47816 Spondylosis without myelopathy or radiculopathy, lumbar region: Secondary | ICD-10-CM | POA: Diagnosis not present

## 2022-12-14 DIAGNOSIS — I110 Hypertensive heart disease with heart failure: Secondary | ICD-10-CM | POA: Diagnosis not present

## 2022-12-14 DIAGNOSIS — U071 COVID-19: Secondary | ICD-10-CM | POA: Diagnosis not present

## 2022-12-14 DIAGNOSIS — F419 Anxiety disorder, unspecified: Secondary | ICD-10-CM | POA: Diagnosis not present

## 2022-12-14 DIAGNOSIS — I4819 Other persistent atrial fibrillation: Secondary | ICD-10-CM | POA: Diagnosis not present

## 2022-12-14 DIAGNOSIS — I350 Nonrheumatic aortic (valve) stenosis: Secondary | ICD-10-CM | POA: Insufficient documentation

## 2022-12-14 DIAGNOSIS — S32030D Wedge compression fracture of third lumbar vertebra, subsequent encounter for fracture with routine healing: Secondary | ICD-10-CM | POA: Diagnosis not present

## 2022-12-14 DIAGNOSIS — I10 Essential (primary) hypertension: Secondary | ICD-10-CM | POA: Diagnosis not present

## 2022-12-14 LAB — CBC
HCT: 32.2 % — ABNORMAL LOW (ref 36.0–46.0)
Hemoglobin: 10.2 g/dL — ABNORMAL LOW (ref 12.0–15.0)
MCH: 30.3 pg (ref 26.0–34.0)
MCHC: 31.7 g/dL (ref 30.0–36.0)
MCV: 95.5 fL (ref 80.0–100.0)
Platelets: 318 10*3/uL (ref 150–400)
RBC: 3.37 MIL/uL — ABNORMAL LOW (ref 3.87–5.11)
RDW: 13.2 % (ref 11.5–15.5)
WBC: 5.6 10*3/uL (ref 4.0–10.5)
nRBC: 0 % (ref 0.0–0.2)

## 2022-12-14 LAB — BASIC METABOLIC PANEL
Anion gap: 9 (ref 5–15)
BUN: 58 mg/dL — ABNORMAL HIGH (ref 8–23)
CO2: 27 mmol/L (ref 22–32)
Calcium: 9.1 mg/dL (ref 8.9–10.3)
Chloride: 90 mmol/L — ABNORMAL LOW (ref 98–111)
Creatinine, Ser: 1.66 mg/dL — ABNORMAL HIGH (ref 0.44–1.00)
GFR, Estimated: 30 mL/min — ABNORMAL LOW (ref >60)
Glucose, Bld: 109 mg/dL — ABNORMAL HIGH (ref 70–99)
Potassium: 5.4 mmol/L — ABNORMAL HIGH (ref 3.5–5.1)
Sodium: 126 mmol/L — ABNORMAL LOW (ref 135–145)

## 2022-12-14 LAB — BRAIN NATRIURETIC PEPTIDE: B Natriuretic Peptide: 117.9 pg/mL — ABNORMAL HIGH (ref 0.0–100.0)

## 2022-12-14 MED ORDER — SPIRONOLACTONE 25 MG PO TABS: 25.0000 mg | ORAL_TABLET | Freq: Every day | ORAL | 3 refills | Status: DC

## 2022-12-14 NOTE — Progress Notes (Signed)
Patient ID: [name], female   DOB: [birth year], 88 y.o.   MRN: [id number]    Advanced Heart Failure Clinic Note   PCP: Donita Brooks, MD Cardiology: Dr. Mayford Knife HF Cardiology: Dr. Shirlee Latch  88 y.o. with history of poorly controlled HTN, chronic atrial fibrillation, chronic diastolic CHF, and prior episode of Takotsubo cardiomyopathy. Patient has had exertional dyspnea since 11/16.  At that time, she presented to the hospital with chest pain.  Coronary angiography showed no significant CAD.  EF was 35-40% by echo, suspected Takotsubo cardiomyopathy.  Her echo in 3/17 showed EF improved to 60-65% but PA systolic pressure elevated at 65 mmHg.    RHC/LHC in 4/22 showed nonobstructive CAD; mean RA 5, PA 44/10, mean PCWP 20, CI 4.89, PVR < 1 WU.  Echo in 8/22 showed EF up to 65-70%, mild LVH, normal RV, PASP 67, moderate RAE, trivial MR. PYP scan in 9/22 was probably negative.   Patient was admitted in 7/23 with chest pain, she had a mild elevation in troponin with no trend.  This was thought to be due to demand ischemia with volume overload rather than ACS.  Echo this admission showed EF 60-65%, mild LVH, moderate RV enlargement, normal RV systolic function, PASP 50 mmHg, mild mitral stenosis with mean gradient 4 mmHg, mild-moderate AS, IVC normal.   7 day Zio 11/23 showed atrial fibrillation, average HR 54 bpm with 2 3-second pauses.  Admitted 3/24 with spinal compression fx. Found to be hyponatremic, with sodium 121 on admission. Initially given IV lasix then SCr elevated and received IVF. IR consulted and she underwent L3 and L4 balloon kyphoplasty. She was discharged to SNF, weight 168 lbs.  Today she returns for HF follow up with her family member. Overall feeling fair. She is now home from rehab. She is not walking much due to hip pain, plans to start PT soon. She is not SOB with ADLs. Not urinating as briskly now on lower torsemide dose. Occasional dizziness but no falls. BP at home has been  controlled. Denies palpitations, CP, edema, or PND/Orthopnea. Appetite ok. No fever or chills. Weight at home 165 pounds. Taking all medications. Main complaint is constipation.  REDs: 35%  ECG (personally reviewed): Atrial fibrillation with slow VR, 57 bpm  Labs (11/23): K 4.1, creatinine 1.85, hgb 10.7 Labs (2/24): K 4.6, creatinine 1.59 => 1.07, BNP 136 Labs (3/24): K 4.7, creatinine 1.21  PMH: 1. Hyperlipidemia: Myalgias with atorvastatin, Crestor, simvastatin. Myalgias with Zetia.  2. Chronic atrial fibrillation 3. HTN: Poor control.  4. H/o breast cancer: 2013.  5. GERD 6. Takotsubo cardiomyopathy: Admitted in 11/16 with chest pain.  Coronary angiography showed nonobstructive CAD.  Echo (11/16) showed EF 35-40%.  EF back to normal by 3/17 echo.  7. Chronic diastolic CHF - Echo (3/17) with EF 60-65%, mild aortic stenosis, PA systolic pressure 65 mmHg, RV mildly dilated with normal systolic function.  - Cardiolite (4/17) with EF 61%, small reversible mid anteroseptal/apical lateral defects thought to be related to shifting breast artifact => low risk study.  - Echo (12/21): EF 65-70%, normal RV, PASP 60 mmHg, mild AS, mild-moderate TR.   - RHC/LHC in 4/22 showed nonobstructive CAD; mean RA 5, PA 44/10, mean PCWP 20, CI 4.89, PVR < 1 WU.   - Echo in 8/22 showed EF up to 65-70%, mild LVH, normal RV, PASP 67, moderate RAE, trivial MR.  - PYP scan (9/22): H/CL < 1.5, grade 1.  Probably negative.   - Echo (  7/23): EF 60-65%, mild LVH, moderate RV enlargement, normal RV systolic function, PASP 50 mmHg, mild mitral stenosis with mean gradient 4 mmHg, mild-moderate AS, IVC normal.  8. Ascending aortic aneurysm: 4.3 cm by CTA in 11/16.  9. Pulmonary hypertension: PASP 65 mmHg by echo in 3/17.  CTA chest in 11/16 with no PE.  PFTs in 7/15 with mild obstructive lung disease.  10. Sleep study negative 2017. 11. Bradycardia: off nodal blockers.  - Holter (8/20): Average HR 51, afib - 7 day Zio  (11/23): AF average HR 54 bpm, 2 3-second pauses. 12. Aortic stenosis: Mild-moderate 7/23 echo. 13. Mitral stenosis: Mild 7/23 echo  14. Compression fracture: s/p L3 and L4 kyphoplasty (3/24)  Social History   Socioeconomic History   Marital status: Widowed    Spouse name: Not on file   Number of children: 1   Years of education: 67   Highest education level: 12th grade  Occupational History   Not on file  Tobacco Use   Smoking status: Never    Passive exposure: Never   Smokeless tobacco: Never  Vaping Use   Vaping Use: Never used  Substance and Sexual Activity   Alcohol use: No   Drug use: No   Sexual activity: Not Currently    Partners: Male  Other Topics Concern   Not on file  Social History Narrative   Husband [name], passed away in 02/19/2022. Pt's son, Loletta Specter lives beside of patient and helps with her care.    Social Determinants of Health   Financial Resource Strain: Low Risk  (11/08/2022)   Overall Financial Resource Strain (CARDIA)    Difficulty of Paying Living Expenses: Not very hard  Recent Concern: Financial Resource Strain - Medium Risk (11/04/2022)   Overall Financial Resource Strain (CARDIA)    Difficulty of Paying Living Expenses: Somewhat hard  Food Insecurity: No Food Insecurity (11/08/2022)   Hunger Vital Sign    Worried About Running Out of Food in the Last Year: Never true    Ran Out of Food in the Last Year: Never true  Transportation Needs: No Transportation Needs (11/08/2022)   PRAPARE - Administrator, Civil Service (Medical): No    Lack of Transportation (Non-Medical): No  Physical Activity: Sufficiently Active (11/08/2022)   Exercise Vital Sign    Days of Exercise per Week: 5 days    Minutes of Exercise per Session: 30 min  Recent Concern: Physical Activity - Insufficiently Active (08/11/2022)   Exercise Vital Sign    Days of Exercise per Week: 5 days    Minutes of Exercise per Session: 10 min  Stress: No Stress Concern Present  (11/08/2022)   [employer] of Occupational Health - Occupational Stress Questionnaire    Feeling of Stress : Only a little  Social Connections: Moderately Integrated (11/08/2022)   Social Connection and Isolation Panel [NHANES]    Frequency of Communication with Friends and Family: More than three times a week    Frequency of Social Gatherings with Friends and Family: More than three times a week    Attends Religious Services: More than 4 times per year    Active Member of [employer] or Organizations: Yes    Attends Banker Meetings: More than 4 times per year    Marital Status: Widowed  Recent Concern: Social Connections - Moderately Isolated (08/11/2022)   Social Connection and Isolation Panel [NHANES]    Frequency of Communication with Friends and Family: More than three times  a week    Frequency of Social Gatherings with Friends and Family: Twice a week    Attends Religious Services: More than 4 times per year    Active Member of [employer] FinancialClubs or Organizations: No    Attends BankerClub or Organization Meetings: Never    Marital Status: Widowed  Intimate Partner Violence: Not At Risk (11/08/2022)   Humiliation, Afraid, Rape, and Kick questionnaire    Fear of Current or Ex-Partner: No    Emotionally Abused: No    Physically Abused: No    Sexually Abused: No   Family History  Problem Relation Age of Onset   Heart disease Mother    Heart attack Mother    Cancer Sister        stomach and colon   Heart disease Brother [id number]   Hypertension Father    Cancer Sister    Stroke Neg Hx    ROS: All systems reviewed and negative except as per HPI.   Current Outpatient Medications  Medication Sig Dispense Refill   acetaminophen (TYLENOL) 500 MG tablet Take 2 tablets (1,000 mg total) by mouth every 8 (eight) hours for 5 days, THEN 2 tablets (1,000 mg total) every 8 (eight) hours as needed for up to 25 days. 30 tablet 0   albuterol (VENTOLIN HFA) 108 (90 Base) MCG/ACT inhaler Inhale 2 puffs into  the lungs every 6 (six) hours as needed for wheezing or shortness of breath. 8 g 0   apixaban (ELIQUIS) 5 MG TABS tablet Take 1 tablet (5 mg total) by mouth 2 (two) times daily. 60 tablet    budesonide-formoterol (SYMBICORT) 80-4.5 MCG/ACT inhaler Inhale 2 puffs into the lungs in the morning and at bedtime. (Patient taking differently: Inhale 2 puffs into the lungs See admin instructions. 2 puffs every morning, [place] take an additional 2 puffs later in the day if needed for wheezing, shortness of breath.) 1 each 12   clonazePAM (KLONOPIN) 0.5 MG tablet Take 1 tablet (0.5 mg total) by mouth 2 (two) times daily as needed. for anxiety 30 tablet 0   cloNIDine (CATAPRES) 0.1 MG tablet Take 1 tablet by mouth twice daily 180 tablet 0   famotidine (PEPCID) 20 MG tablet Take 20 mg by mouth at bedtime as needed for heartburn.     ferrous sulfate 325 (65 FE) MG tablet Take 1 tablet (325 mg total) by mouth daily with breakfast. 90 tablet 3   fluticasone (FLONASE) 50 MCG/ACT nasal spray Place 1 spray into both nostrils daily as needed for allergies or rhinitis.     losartan (COZAAR) 100 MG tablet Take 1 tablet by mouth once daily 90 tablet 0   nitroGLYCERIN (NITROSTAT) 0.4 MG SL tablet DISSOLVE ONE TABLET UNDER THE TONGUE EVERY 5 MINUTES AS NEEDED FOR CHEST PAIN.  DO NOT EXCEED A TOTAL OF 3 DOSES IN 15 MINUTES (Patient taking differently: Place 0.4 mg under the tongue every 5 (five) minutes x 3 doses as needed for chest pain.) 25 tablet 8   ondansetron (ZOFRAN-ODT) 4 MG disintegrating tablet Take 1 tablet (4 mg total) by mouth every 8 (eight) hours as needed for nausea or vomiting. 20 tablet 0   polyethylene glycol powder (MIRALAX) 17 GM/SCOOP powder Take 17 g by mouth 2 (two) times daily as needed for mild constipation. 255 g 2   ranolazine (RANEXA) 500 MG 12 hr tablet Take 500 mg by mouth daily as needed (chest pain).     senna-docusate (SENOKOT-S) 8.6-50 MG tablet Take 2 tablets by mouth  2 (two) times daily between  meals as needed for moderate constipation. 180 tablet 0   torsemide (DEMADEX) 20 MG tablet Take 20 mg by mouth 2 (two) times daily.     No current facility-administered medications for this encounter.   BP (!) 174/62   Pulse (!) 58   Wt 78.4 kg (172 lb 12.8 oz)   SpO2 97%   BMI 28.76 kg/m    Wt Readings from Last 3 Encounters:  12/14/22 78.4 kg (172 lb 12.8 oz)  11/24/22 76.6 kg (168 lb 14.4 oz)  11/17/22 76.2 kg (168 lb)   Physical Exam General:  NAD. No resp difficulty, arrived in [facility], elderly HEENT: Normal Neck: Supple. No JVD. Carotids 2+ bilat; no bruits. No lymphadenopathy or thryomegaly appreciated. Cor: PMI nondisplaced. Irregular rate & rhythm. No rubs, gallops, 2/6 SEM RUSB Lungs: Clear Abdomen: Soft, nontender, nondistended. No hepatosplenomegaly. No bruits or masses. Good bowel sounds. Extremities: No cyanosis, clubbing, rash, trace ankle edema Neuro: Alert & oriented x 3, cranial nerves grossly intact. Moves all 4 extremities w/o difficulty. Affect pleasant.  Assessment/Plan: 1.  CAD: Nonobstructive on 4/22 cath. She has been intolerant of statins and Zetia.   She has had atypical chest pain in the past. Admission 7/23 with CP, elevated HsT, CT ruled out acute aortic syndrome, echo ok. Pain felt to be related to coronary microvascular dysfunction.   - No ASA given apixaban use.  - Intolerant of statins and Zetia as above, does not want referral to lipid clinic.  2. Chronic atrial fibrillation/bradycardia: She is off nodal blockade. - Continue Eliquis 5 mg bid, based on last BMET 5 mg bid is the correct dosing. CBC today. 3. HTN: BP elevated today but generally well-controlled at home. - Restart spironolactone 25 mg daily.  4. Chronic diastolic CHF: NYHA class III symptoms, slowly progressive over time.  Mild volume overload on exam.  RHC/LHC in 4/22 showed nonobstructive CAD; mean RA 5, PA 44/10, mean PCWP 20, CI 4.89, PVR < 1 WU (pulmonary venous hypertension).  Echo  in 8/22 showed EF up to 65-70%, mild LVH, normal RV, PASP 67, moderate RAE, trivial MR. PYP scan in 9/22 was probably negative. Echo in 7/23 showed EF 60-65%, mild LVH, moderate RV enlargement, normal RV systolic function, PASP 50 mmHg, mild mitral stenosis with mean gradient 4 mmHg, mild-moderate AS, IVC normal. NYHA class III symptoms, she does not look volume overly overloaded on exam, weight down 6 lbs and REDs 35%.  - Restart spironolactone 25 mg daily. BMET and BNP today, repeat BMET in 1 week. - Continue torsemide 40 mg bid. - Continue losartan 100 mg daily.  - Unable to tolerate Jardiance due to frequent UTIs.  - she has declined Cardiomems 5. Aortic stenosis: Mild-moderate on last echo.  - Update echo. 6. Physical Deconditioning: Continue HH PT.  Follow up in 3 months with Dr. Shirlee Latch + echo.  Anderson Malta Lane, FNP-BC 12/14/2022

## 2022-12-14 NOTE — Progress Notes (Signed)
ReDS Vest / Clip - 12/14/22 1200       ReDS Vest / Clip   Station Marker A    Ruler Value 32    ReDS Value Range Low volume    ReDS Actual Value 35

## 2022-12-14 NOTE — Patient Instructions (Addendum)
EKG done today.  Labs done today. We will contact you only if your labs are abnormal.  RESTART Spironolactone 25mg  (1 tablet) by mouth daily.   No other medication changes were made. Please continue all current medications as prescribed.  Your physician recommends that you schedule a follow-up appointment in: 1 week for a lab only appointment at [facility] and in 3 months with Dr. Shirlee Latch with an echo prior to your appointment. Please contact our office in June to schedule a July appointment.   Your physician has requested that you have an echocardiogram. Echocardiography is a painless test that uses sound waves to create images of your heart. It provides your doctor with information about the size and shape of your heart and how well your heart's chambers and valves are working. This procedure takes approximately one hour. There are no restrictions for this procedure. Please do NOT wear cologne, perfume, aftershave, or lotions (deodorant is allowed). Please arrive 15 minutes prior to your appointment time.  If you have any questions or concerns before your next appointment please send [place] a message through [place] or call our office at [phone].    TO LEAVE A MESSAGE FOR THE NURSE SELECT OPTION 2, PLEASE LEAVE A MESSAGE INCLUDING: YOUR NAME DATE OF BIRTH CALL BACK NUMBER REASON FOR CALL**this is important as we prioritize the call backs  YOU WILL RECEIVE A CALL BACK THE SAME DAY AS LONG AS YOU CALL BEFORE 4:00 PM   Do the following things EVERYDAY: Weigh yourself in the morning before breakfast. Write it down and keep it in a log. Take your medicines as prescribed Eat low salt foods--Limit salt (sodium) to 2000 mg per day.  Stay as active as you can everyday Limit all fluids for the day to less than 2 liters   At the [facility], you and your health needs are our priority. As part of our continuing mission to provide you with exceptional heart care, we have created  designated Provider Care Teams. These Care Teams include your primary Cardiologist (physician) and Advanced Practice Providers (APPs- Physician Assistants and Nurse Practitioners) who all work together to provide you with the care you need, when you need it.   You [place] see any of the following providers on your designated Care Team at your next follow up: Dr Arvilla Meres Dr Carron Curie, NP Robbie Lis, [place] Karle Plumber, PharmD   Please be sure to bring in all your medications bottles to every appointment.

## 2022-12-15 ENCOUNTER — Encounter: Payer: Self-pay | Admitting: *Deleted

## 2022-12-15 ENCOUNTER — Ambulatory Visit: Payer: Self-pay | Admitting: *Deleted

## 2022-12-15 DIAGNOSIS — M1990 Unspecified osteoarthritis, unspecified site: Secondary | ICD-10-CM | POA: Diagnosis not present

## 2022-12-15 DIAGNOSIS — I350 Nonrheumatic aortic (valve) stenosis: Secondary | ICD-10-CM | POA: Diagnosis not present

## 2022-12-15 DIAGNOSIS — D509 Iron deficiency anemia, unspecified: Secondary | ICD-10-CM | POA: Diagnosis not present

## 2022-12-15 DIAGNOSIS — U071 COVID-19: Secondary | ICD-10-CM | POA: Diagnosis not present

## 2022-12-15 DIAGNOSIS — N1832 Chronic kidney disease, stage 3b: Secondary | ICD-10-CM | POA: Diagnosis not present

## 2022-12-15 DIAGNOSIS — F419 Anxiety disorder, unspecified: Secondary | ICD-10-CM | POA: Diagnosis not present

## 2022-12-15 DIAGNOSIS — I130 Hypertensive heart and chronic kidney disease with heart failure and stage 1 through stage 4 chronic kidney disease, or unspecified chronic kidney disease: Secondary | ICD-10-CM | POA: Diagnosis not present

## 2022-12-15 DIAGNOSIS — I5030 Unspecified diastolic (congestive) heart failure: Secondary | ICD-10-CM | POA: Diagnosis not present

## 2022-12-15 DIAGNOSIS — J309 Allergic rhinitis, unspecified: Secondary | ICD-10-CM | POA: Diagnosis not present

## 2022-12-15 DIAGNOSIS — J449 Chronic obstructive pulmonary disease, unspecified: Secondary | ICD-10-CM | POA: Diagnosis not present

## 2022-12-15 DIAGNOSIS — I2510 Atherosclerotic heart disease of native coronary artery without angina pectoris: Secondary | ICD-10-CM | POA: Diagnosis not present

## 2022-12-15 DIAGNOSIS — M47816 Spondylosis without myelopathy or radiculopathy, lumbar region: Secondary | ICD-10-CM | POA: Diagnosis not present

## 2022-12-15 DIAGNOSIS — I5181 Takotsubo syndrome: Secondary | ICD-10-CM | POA: Diagnosis not present

## 2022-12-15 DIAGNOSIS — S32030D Wedge compression fracture of third lumbar vertebra, subsequent encounter for fracture with routine healing: Secondary | ICD-10-CM | POA: Diagnosis not present

## 2022-12-15 DIAGNOSIS — M48061 Spinal stenosis, lumbar region without neurogenic claudication: Secondary | ICD-10-CM | POA: Diagnosis not present

## 2022-12-15 DIAGNOSIS — I4819 Other persistent atrial fibrillation: Secondary | ICD-10-CM | POA: Diagnosis not present

## 2022-12-15 NOTE — Patient Instructions (Signed)
Visit Information  Thank you for taking time to visit with me today. Please don't hesitate to contact me if I can be of assistance to you.   Following are the goals we discussed today:   Goals Addressed               This Visit's Progress     Provide Counseling & Supportive Services. (pt-stated)   On track     Care Coordination Interventions:  Interventions Today    Flowsheet Row Most Recent Value  Chronic Disease   Chronic disease during today's visit Atrial Fibrillation (AFib), Hypertension (HTN), Congestive Heart Failure (CHF), Chronic Kidney Disease/End Stage Renal Disease (ESRD), Chronic Obstructive Pulmonary Disease (COPD), Other  [Feelings of Sadness & Frustration Regarding Loss of Independence.]  General Interventions   General Interventions Discussed/Reviewed General Interventions Discussed, General Interventions Reviewed, Annual Eye Exam, Durable Medical Equipment (DME), Vaccines, Health Screening, Community Resources, Level of Care, Communication with, Doctor Visits  [Primary Care Provider]  Vaccines COVID-19, Flu, Pneumonia, RSV, Shingles, Tetanus/Pertussis/Diphtheria  [Encouraged]  Doctor Visits Discussed/Reviewed Doctor Visits Discussed, Doctor Visits Reviewed, Annual Wellness Visits, PCP, Specialist  [Encouraged]  Health Screening Bone Density, Colonoscopy, Mammogram  [Encouraged]  Durable Medical Equipment (DME) BP Cuff  PCP/Specialist Visits Compliance with follow-up visit  [Encouraged]  Communication with PCP/Specialists, RN  Level of Care Adult Daycare, Applications, Assisted Living, Personal Care Services  Applications Medicaid, Personal Care Services  Exercise Interventions   Exercise Discussed/Reviewed Exercise Discussed, Exercise Reviewed, Physical Activity, Weight Managment, Assistive device use and maintanence  [Encouraged]  Physical Activity Discussed/Reviewed Physical Activity Discussed, Physical Activity Reviewed, Types of exercise, Home Exercise Program  (HEP)  [Encouraged]  Weight Management Weight maintenance  [Encouraged]  Education Interventions   Education Provided Provided Therapist, sports, Provided Education  Provided Verbal Education On Nutrition, Foot Care, Eye Care, Mental Health/Coping with Illness, Applications, Exercise, Medication, Insurance Plans, [employer], When to see the doctor  Applications Medicaid, Personal Care Services  Mental Health Interventions   Mental Health Discussed/Reviewed Mental Health Discussed, Mental Health Reviewed, Coping Strategies, Suicide, Crisis, Substance Abuse, Grief and Loss, Depression, Anxiety  Refer to Social Work for counseling regarding Anxiety/Coping, Depression, Grief and Loss  Nutrition Interventions   Nutrition Discussed/Reviewed Nutrition Discussed, Nutrition Reviewed, Portion sizes, Decreasing sugar intake, Decreasing salt, Decreasing fats, Increaing proteins, Fluid intake, Adding fruits and vegetables  [Encouraged]  Pharmacy Interventions   Pharmacy Dicussed/Reviewed Pharmacy Topics Discussed, Pharmacy Topics Reviewed, Medication Adherence, Affording Medications  Safety Interventions   Safety Discussed/Reviewed Safety Discussed, Safety Reviewed, Fall Risk, Home Safety  [Encouraged]  Home Engineer, water, Refer for community resources  Advanced Directive Interventions   Advanced Directives Discussed/Reviewed Advanced Directives Discussed, Advanced Directives Reviewed     Active Listening & Reflection Utilized.  Verbalization of Feelings Encouraged.  Emotional Support Offered. Feelings of Grief Validated. Symptoms of Grief Acknowledged. Grief & Loss Support Groups Reviewed. Self-Enrollment in Grief & Loss Support Group Emphasized. Problem Solving Interventions Implemented. Task-Centered Solutions Activated.   Solution-Focused Strategies Employed. Acceptance & Commitment Therapy Indicated. Cognitive Behavioral Therapy Initiated. Client-Centered Therapy  Performed. Deep Breathing Exercises, Relaxation Techniques & Mindfulness Meditation Strategies Reviewed & Encouraged Daily. Please Continue to Receive Home Health Physical Therapy & [facility], through [facility] [phone]). Please Contact The Following List of [employer], Services & Resources of Interest, in An Effort to Obtain Care & Supervision in The Home: ~ Adult Day Care Programs  ~ In-Home Care & Respite Agencies ~ Home Health Care Agencies ~  Respite Care Agencies & Facilities ~ Aging, Disability & Transit Services of [facility] Please Begin Administering Spironolactone 25 MG, PO, Daily, as Prescribed by Board Certified Family Nurse Practitioner, Prince Rome with [facility], [facility] [phone]), During Follow-Up Appointment on 12/14/2022. CSW Collaboration with Primary Care Provider, Dr. Lynnea Ferrier with [facility] [phone]), Via Secure Chat Message in Epic, to Request Prescription Refill for Oxycodone, Per Patient's Request, Due to Extreme Right Hip & Leg Pain. CSW Collaboration with Primary Care Provider, Dr. Lynnea Ferrier with [facility] [phone]), Via Secure Chat Message in Epic, to Request Prescription for GoLYTELY, Per Patient's Request, Due to Constipation. CSW Collaboration with Primary Care Provider, Dr. Lynnea Ferrier with [facility] [phone]), Via Secure Chat Message in Epic, to Voice Patient's Concerns Regarding Inability to Relieve Bowels, Despite Drinking Miralax, Taking Over-the-Counter Stool Softeners, Drinking Plenty of Water & Eating Foods Rich in Fiber. Please Attend Follow-Up Appointment with Primary Care Provider, Dr. Lynnea Ferrier with [facility] [phone]), Scheduled on 12/16/2022 at 2:00 PM.  Please Contact CSW Directly (#  [phone]), if You Have Questions, Need Assistance, or If Additional Social Work Needs Are Identified Between Now & Our Next Scheduled Telephone [employer].      Our next appointment is by telephone on 12/27/2022 at 11:15 am.  Please call the care guide team at [phone] if you need to cancel or reschedule your appointment.   If you are experiencing a Mental Health or Behavioral Health Crisis or need someone to talk to, please call the Suicide and Crisis Lifeline: 988 call the [place] National Suicide Prevention Lifeline: 862-291-9494 or TTY: 4092180245 TTY 662-688-9978) to talk to a trained counselor call 1-800-273-TALK (toll free, 24 hour hotline) go to [facility] [address] [place] [phone]) call the [facility] Crisis Line: ([phone] call 911  Patient verbalizes understanding of instructions and care plan provided today and agrees to view in MyChart. Active MyChart status and patient understanding of how to access instructions and care plan via MyChart confirmed with patient.     Telephone follow up appointment with care management team member scheduled for:  12/27/2022 at 11:15 am.  Danford Bad, BSW, MSW, LCSW  Licensed Clinical Social Worker  [employer] [facility]  Mailing [place]. [address] [place], [place] [address] Physical Address[address], [place], [place] [address] Toll Free Main # [phone] Fax # [phone] Cell # [phone] [email or web address]

## 2022-12-15 NOTE — Patient Outreach (Signed)
Care Coordination   Follow Up Visit Note   12/15/2022  Name: [name] MRN: [id number] DOB: Sep 22, 1934  [name] is a 88 y.o. year old female who sees [name], [name], MD for primary care. I spoke with [name] by phone today.  What matters to the patients health and wellness today?  Provide Counseling & Supportive Services.   Goals Addressed               This Visit's Progress     Provide Counseling & Supportive Services. (pt-stated)   On track     Care Coordination Interventions:  Interventions Today    Flowsheet Row Most Recent Value  Chronic Disease   Chronic disease during today's visit Atrial Fibrillation (AFib), Hypertension (HTN), Congestive Heart Failure (CHF), Chronic Kidney Disease/End Stage Renal Disease (ESRD), Chronic Obstructive Pulmonary Disease (COPD), Other  [Feelings of Sadness & Frustration Regarding Loss of Independence.]  General Interventions   General Interventions Discussed/Reviewed General Interventions Discussed, General Interventions Reviewed, Annual Eye Exam, Durable Medical Equipment (DME), Vaccines, Health Screening, Community Resources, Level of Care, Communication with, Doctor Visits  [Primary Care Provider]  Vaccines COVID-19, Flu, Pneumonia, RSV, Shingles, Tetanus/Pertussis/Diphtheria  [Encouraged]  Doctor Visits Discussed/Reviewed Doctor Visits Discussed, Doctor Visits Reviewed, Annual Wellness Visits, PCP, Specialist  [Encouraged]  Health Screening Bone Density, Colonoscopy, Mammogram  [Encouraged]  Durable Medical Equipment (DME) BP Cuff  PCP/Specialist Visits Compliance with follow-up visit  [Encouraged]  Communication with PCP/Specialists, RN  Level of Care Adult Daycare, Applications, Assisted Living, Personal Care Services  Applications Medicaid, Personal Care Services  Exercise Interventions   Exercise Discussed/Reviewed Exercise Discussed, Exercise Reviewed, Physical Activity, Weight Managment, Assistive device use and  maintanence  [Encouraged]  Physical Activity Discussed/Reviewed Physical Activity Discussed, Physical Activity Reviewed, Types of exercise, Home Exercise Program (HEP)  [Encouraged]  Weight Management Weight maintenance  [Encouraged]  Education Interventions   Education Provided Provided Therapist, sports, Provided Education  Provided Verbal Education On Nutrition, Foot Care, Eye Care, Mental Health/Coping with Illness, Applications, Exercise, Medication, Insurance Plans, [employer], When to see the doctor  Applications Medicaid, Personal Care Services  Mental Health Interventions   Mental Health Discussed/Reviewed Mental Health Discussed, Mental Health Reviewed, Coping Strategies, Suicide, Crisis, Substance Abuse, Grief and Loss, Depression, Anxiety  Refer to Social Work for counseling regarding Anxiety/Coping, Depression, Grief and Loss  Nutrition Interventions   Nutrition Discussed/Reviewed Nutrition Discussed, Nutrition Reviewed, Portion sizes, Decreasing sugar intake, Decreasing salt, Decreasing fats, Increaing proteins, Fluid intake, Adding fruits and vegetables  [Encouraged]  Pharmacy Interventions   Pharmacy Dicussed/Reviewed Pharmacy Topics Discussed, Pharmacy Topics Reviewed, Medication Adherence, Affording Medications  Safety Interventions   Safety Discussed/Reviewed Safety Discussed, Safety Reviewed, Fall Risk, Home Safety  [Encouraged]  Home Engineer, water, Refer for community resources  Advanced Directive Interventions   Advanced Directives Discussed/Reviewed Advanced Directives Discussed, Advanced Directives Reviewed     Active Listening & Reflection Utilized.  Verbalization of Feelings Encouraged.  Emotional Support Offered. Feelings of Grief Validated. Symptoms of Grief Acknowledged. Grief & Loss Support Groups Reviewed. Self-Enrollment in Grief & Loss Support Group Emphasized. Problem Solving Interventions Implemented. Task-Centered Solutions  Activated.   Solution-Focused Strategies Employed. Acceptance & Commitment Therapy Indicated. Cognitive Behavioral Therapy Initiated. Client-Centered Therapy Performed. Deep Breathing Exercises, Relaxation Techniques & Mindfulness Meditation Strategies Reviewed & Encouraged Daily. Please Continue to Receive Home Health Physical Therapy & [facility], through [facility] [phone]). Please Contact The Following List of [employer], [employer] of  Interest, in An Effort to Obtain Care & Supervision in The Home: ~ Adult Day Care Programs  ~ In-Home Care & Respite Agencies ~ Home Health Care Agencies ~ Respite Care Agencies & Facilities ~ Aging, Disability & Transit Services of [facility] Please Begin Administering Spironolactone 25 MG, PO, Daily, as Prescribed by Board Certified Family Nurse Practitioner, Prince Rome with [facility], [facility] [phone]), During Follow-Up Appointment on 12/14/2022. CSW Collaboration with Primary Care Provider, Dr. Lynnea Ferrier with [facility] [phone]), Via Secure Chat Message in Epic, to Request Prescription Refill for Oxycodone, Per Patient's Request, Due to Extreme Right Hip & Leg Pain. CSW Collaboration with Primary Care Provider, Dr. Lynnea Ferrier with [facility] [phone]), Via Secure Chat Message in Epic, to Request Prescription for GoLYTELY, Per Patient's Request, Due to Constipation. CSW Collaboration with Primary Care Provider, Dr. Lynnea Ferrier with  [facility] [phone]), Via Secure Chat Message in Epic, to Voice Patient's Concerns Regarding Inability to Relieve Bowels, Despite Drinking Miralax, Taking Over-the-Counter Stool Softeners, Drinking Plenty of Water & Eating Foods Rich in Fiber. Please Attend Follow-Up Appointment with Primary Care  Provider, Dr. Lynnea Ferrier with [facility] [phone]), Scheduled on 12/16/2022 at 2:00 PM.  Please Contact CSW Directly (# [phone]), if You Have Questions, Need Assistance, or If Additional Social Work Needs Are Identified Between Now & Our Next Scheduled Telephone [employer].      SDOH assessments and interventions completed:  Yes.  Care Coordination Interventions:  Yes, provided.   Follow up plan: Follow up call scheduled for 12/27/2022 at 11:15 am.  Encounter Outcome:  Pt. Visit Completed.   Danford Bad, BSW, MSW, LCSW  Licensed Restaurant manager, [employer] [facility]  Mailing [place] [address] [place], [place] [address] Physical Address-[address] [place], [place] [address] Toll Free Main # [phone] Fax # [phone] Cell # [phone] [email or web address]

## 2022-12-16 ENCOUNTER — Ambulatory Visit (INDEPENDENT_AMBULATORY_CARE_PROVIDER_SITE_OTHER): Payer: Medicare Other | Admitting: Family Medicine

## 2022-12-16 ENCOUNTER — Encounter: Payer: Self-pay | Admitting: Family Medicine

## 2022-12-16 ENCOUNTER — Telehealth: Payer: Self-pay

## 2022-12-16 ENCOUNTER — Telehealth (HOSPITAL_COMMUNITY): Payer: Self-pay

## 2022-12-16 ENCOUNTER — Ambulatory Visit
Admission: RE | Admit: 2022-12-16 | Discharge: 2022-12-16 | Disposition: A | Discharge status: Home / Self Care | Payer: Medicare Other | Source: Ambulatory Visit | Attending: Family Medicine | Admitting: Family Medicine

## 2022-12-16 VITALS — BP 152/70 | HR 63 | Temp 97.7°F | Ht 65.0 in | Wt 170.8 lb

## 2022-12-16 DIAGNOSIS — M25551 Pain in right hip: Secondary | ICD-10-CM

## 2022-12-16 DIAGNOSIS — N1832 Chronic kidney disease, stage 3b: Secondary | ICD-10-CM | POA: Diagnosis not present

## 2022-12-16 DIAGNOSIS — I5030 Unspecified diastolic (congestive) heart failure: Secondary | ICD-10-CM | POA: Diagnosis not present

## 2022-12-16 DIAGNOSIS — M1990 Unspecified osteoarthritis, unspecified site: Secondary | ICD-10-CM | POA: Diagnosis not present

## 2022-12-16 DIAGNOSIS — I5181 Takotsubo syndrome: Secondary | ICD-10-CM | POA: Diagnosis not present

## 2022-12-16 DIAGNOSIS — I130 Hypertensive heart and chronic kidney disease with heart failure and stage 1 through stage 4 chronic kidney disease, or unspecified chronic kidney disease: Secondary | ICD-10-CM | POA: Diagnosis not present

## 2022-12-16 DIAGNOSIS — S32030D Wedge compression fracture of third lumbar vertebra, subsequent encounter for fracture with routine healing: Secondary | ICD-10-CM | POA: Diagnosis not present

## 2022-12-16 DIAGNOSIS — D509 Iron deficiency anemia, unspecified: Secondary | ICD-10-CM | POA: Diagnosis not present

## 2022-12-16 DIAGNOSIS — U071 COVID-19: Secondary | ICD-10-CM | POA: Diagnosis not present

## 2022-12-16 DIAGNOSIS — J309 Allergic rhinitis, unspecified: Secondary | ICD-10-CM | POA: Diagnosis not present

## 2022-12-16 DIAGNOSIS — I350 Nonrheumatic aortic (valve) stenosis: Secondary | ICD-10-CM | POA: Diagnosis not present

## 2022-12-16 DIAGNOSIS — I4819 Other persistent atrial fibrillation: Secondary | ICD-10-CM | POA: Diagnosis not present

## 2022-12-16 DIAGNOSIS — M48061 Spinal stenosis, lumbar region without neurogenic claudication: Secondary | ICD-10-CM | POA: Diagnosis not present

## 2022-12-16 DIAGNOSIS — F419 Anxiety disorder, unspecified: Secondary | ICD-10-CM | POA: Diagnosis not present

## 2022-12-16 DIAGNOSIS — S32030A Wedge compression fracture of third lumbar vertebra, initial encounter for closed fracture: Secondary | ICD-10-CM

## 2022-12-16 DIAGNOSIS — J449 Chronic obstructive pulmonary disease, unspecified: Secondary | ICD-10-CM | POA: Diagnosis not present

## 2022-12-16 DIAGNOSIS — I2510 Atherosclerotic heart disease of native coronary artery without angina pectoris: Secondary | ICD-10-CM | POA: Diagnosis not present

## 2022-12-16 DIAGNOSIS — E871 Hypo-osmolality and hyponatremia: Secondary | ICD-10-CM | POA: Diagnosis not present

## 2022-12-16 DIAGNOSIS — M47816 Spondylosis without myelopathy or radiculopathy, lumbar region: Secondary | ICD-10-CM | POA: Diagnosis not present

## 2022-12-16 MED ORDER — OXYCODONE-ACETAMINOPHEN 5-325 MG PO TABS: 1.0000 | ORAL_TABLET | ORAL | 0 refills | Status: DC | PRN

## 2022-12-16 NOTE — Progress Notes (Signed)
Subjective:    Patient ID: [name], female    DOB: December 10, 1934, 88 y.o.   MRN: [id number] Admit date: 11/18/2022 Discharge date: 11/24/2022 Admitted From: Home Disposition: SNF Recommendations for Outpatient Follow-up:  Follow up with cardiology as previously planned Check fluid status, BP, BMP and CBC in 1 week Please follow up on the following pending results: None     Discharge Condition: Stable CODE STATUS: Full code     Hospital course 88 y.o. female PMH of diastolic CHF, CKD-3B, A-fib on Eliquis, COPD, CAD, left breast cancer, colon cancer, GERD and anxiety presenting with low back pain and right hip pain for 2 days.  Seen by PCP the day prior to presentation and had a CT that showed L3 inferior endplate compression fracture with 35% height loss but no retropulsion, and multilevel lumbar spondylosis and moderate spinal canal stenosis at L4-L5.  She was advised to go to the hospital but did not come until her pain was unbearable the next day.     In ED, she was hyponatremic to 121 (baseline 130s).  Pelvic x-ray without acute finding.  Initially, concern about hypervolemic hyponatremia and she was given IV Lasix.  However, creatinine and hyponatremia gotten worse.  She was started on gentle IV fluid and p.o. sodium chloride.  Patient's renal function and hyponatremia improved.  IV fluid and sodium chloride discontinued.  Started on torsemide at reduced dose.  Sodium continued to improve and back to baseline.  Discontinued Aldactone and metolazone on discharge.  Decreased home torsemide to 60 mg twice daily.    In regards to compression fracture, MRI lumbar spine confirms CT finding.  IR consulted.  Patient had L3 and L4 balloon kyphoplasty on 3/18 with significant improvement in her pain.   Therapy recommended SNF.  Medically stable for discharge.   See individual problem list below for more.    Problems addressed during this hospitalization Principal Problem:    Hyponatremia Active Problems:   Persistent atrial fibrillation (HCC)   Hypertension   COPD (chronic obstructive pulmonary disease) (HCC)   Coronary artery disease   Chronic diastolic CHF (congestive heart failure) (HCC)   Chronic kidney disease, stage 3b (HCC)   Closed compression fracture of L3 lumbar vertebra, initial encounter (HCC)   Lower back pain   Normocytic anemia   Hyponatremia: Likely due to metolazone, Aldactone and losartan.  Baseline about 130. Last Labs              Recent Labs  Lab 11/19/22 0253 11/19/22 0718 11/19/22 1238 11/19/22 2003 11/20/22 0100 11/20/22 0706 11/21/22 0025 11/22/22 0043 11/23/22 0135 11/24/22 0029  NA 118* 121* 122* 123* 125* 126* 128* 129* 130* 131*    -Continue holding home Aldactone and metolazone -Torsemide to 60 mg twice daily.  Takes 80 twice daily at home. -Recheck in 1 week   Acute lower back pain due to acute close L3 compression fracture: No reported trauma, fall or heavy lifting. CT showed L3 compression fracture with 35% height loss but no retropulsion.  MRI confirmed this.  Vitamin D level 25.11. -S/p L3 and L4 kyphoplasty on 3/18. -Significant improvement in pain and mobility after kyphoplasty. -Pain control with scheduled Tylenol and as needed oxycodone -Bowel regimen -P.o. vitamin D -PT/OT   Chronic diastolic CHF: Patient has chronic dyspnea and BLE edema unchanged from baseline.  No orthopnea or PND.  Overall, she appears euvolemic on exam.  CXR without vascular congestion or edema.  BNP only 136.  TTE in  03/2022 with LVEF of 60 to 65%, RVSP of 50 mmHg.  Restarted on torsemide at reduced dose.  Net -3.5 L.  Renal function stable. -Torsemide to 60 mg twice daily.  Takes 80 twice daily before admission. -Continue home losartan -Continue holding metolazone and Aldactone. -Reassess fluid status, renal function and adjust diuretics as appropriate.   Chronic COPD: Stable. -Continue home inhalers.   History of CAD: No  chest pain.  Seems to take Ranexa as needed at home -Continue losartan   Persistent atrial fibrillation: Rate controlled without medications.  On low-dose Eliquis for anticoagulation -Continue full dose Eliquis.  Does not meet criteria for low-dose Eliquis. -Optimize electrolytes.   Essential hypertension: BP slightly elevated. -Continue home clonidine and losartan. -Torsemide as above -Discontinue amlodipine given lower extremity edema and CHF.   CKD-3B: Cr stable. Recent Labs (within last 365 days)              Recent Labs    11/17/22 1151 11/18/22 0635 11/18/22 1350 11/18/22 2046 11/19/22 0253 11/20/22 0706 11/21/22 0025 11/22/22 0043 11/23/22 0135 11/24/22 0029  BUN 52* 44* 42* 43* 46* 37* 34* 39* 38* 37*  CREATININE 1.45* 1.21* 1.29* 1.39* 1.33* 1.05* 0.95 1.27* 1.13* 1.21*    -Recheck CBC in 1 week     Normocytic iron deficiency anemia: Slight drop in Hgb likely dilutional.  No overt bleeding. Recent Labs (within last 365 days)              Recent Labs    09/28/22 0902 10/29/22 1244 11/17/22 1151 11/18/22 0635 11/19/22 0253 11/20/22 0100 11/21/22 0025 11/22/22 0043 11/23/22 0135 11/24/22 0029  HGB 10.7* 10.3* 10.4* 10.2* 9.4* 9.1* 8.8* 8.5* 8.9* 9.1*    -Received IV ferric gluconate 250 mg daily x 2 -Vitamin B12 is 270.  Received vitamin B12 injection.  Continue p.o.    Constipation: Resolved. -Bowel regimen   Goal of care counseling/ACP: Full code.     Mild hyperkalemia: Resolved. -Discontinued Aldactone.   Anxiety: Stable. -Continue home Klonopin.   Physical deconditioning -PT/OT-recommended SNF.   Overweight: Body mass index is 28.11 kg/m.  12/16/22 Patient is here today with her family member.  She is in a wheelchair.  She is walking albeit with significant pain in her right hip.  She states that it hurts to bear weight on her right hip.  She reports pain over the lateral right hip, the right pelvis, and anterior right hip.  There were  no x-rays of the hip taken during the hospitalization.  She is using oxycodone for this pain.  Family ember states that she needs help bathing.  The patient is unable to do this by herself.  She also is having difficulty moving and walking and transferring.  She would benefit from physical therapy.  She is currently on torsemide 40 mg twice daily.  She denies any chest pain or shortness of breath.  Previously she was on 60 mg twice daily with spironolactone but these have been held due to hyponatremia.  She appears to be due to recheck her sodium level Past Medical History:  Diagnosis Date  . Allergy    rhinitis  . Aortic stenosis    mild by echo 06/2017  . Arthritis   . Bradycardia    a. 10/2017 -> beta blocker cut back due to HR 39.  . Breast cancer 01/06/2012  . Cancer    right colon and left breast  . Chronic diastolic CHF (congestive heart failure)   . Colon  cancer 01/06/2012  . Colovesical fistula    Dr. Carolynne Edouard and Dr. Laverle Patter planning surgery 08/2018- surgery revealed spontaneous closure  . COPD (chronic obstructive pulmonary disease)    pt. denies  . Coronary artery disease 2006   a.  NSTEMI in 2016, cath showed 15% prox-mid RCA, 20% prox LAD, EF 25-35% by cath and 35-40% -> felt due to Takotsubo cardiomyopathy.  . Dilated aortic root    by echo 06/2017  . Diverticulosis   . Dyspnea   . Edema extremities   . GERD (gastroesophageal reflux disease)   . Hernia   . Hiatal hernia    denies  . Hyperlipidemia   . Hypertension   . Mild aortic stenosis    echo 11/2015 but not noted on echo 06/2016  . Osteopenia   . Permanent atrial fibrillation    chronic atrial fibrillation  . Pneumonia    hx child  . Pulmonary HTN    a. moderate to severe PASP echo 11/2015 - now by echo 06/2017. CTA chest in 11/16 with no PE. PFTs in 7/15 with mild obstructive lung disease. She had a negative sleep study in 2017. b. Felt due to left sided HF.  Marland Kitchen Stroke   . Takotsubo syndrome  07/29/2015   a. EF 35-40% by echo; akinesis of mid-apical anteroseptal and apical myocardium.  EF now normalized on echo 11/2015   Past Surgical History:  Procedure Laterality Date  . APPENDECTOMY    . BREAST SURGERY     lumpectomy left  . CARDIAC CATHETERIZATION    . CARDIAC CATHETERIZATION N/A 07/28/2015   Procedure: Left Heart Cath and Coronary Angiography;  Surgeon: Peter M Swaziland, MD;  Location: [facility] INVASIVE CV LAB;  Service: Cardiovascular;  Laterality: N/A;  . CHOLECYSTECTOMY    . COLECTOMY     right side  . CYSTOSCOPY WITH STENT PLACEMENT Left 09/04/2018   Procedure: CYSTOSCOPY WITH LEFT STENT PLACEMENT, BLADDER REPAIR, CYSTOSCOPY WITH LEFT STENT REMOVAL;  Surgeon: Heloise Purpura, MD;  Location: [facility] ORS;  Service: Urology;  Laterality: Left;  . EXCISION OF ACCESSORY NIPPLE Bilateral 05/30/2013   Procedure: BILATERAL NIPPLE BIOPSY;  Surgeon: Robyne Askew, MD;  Location: [facility] OR;  Service: General;  Laterality: Bilateral;  . EYE SURGERY Bilateral 12   cataracts  . HYSTEROSCOPY WITH D & C N/A 05/29/2020   Procedure: DILATATION AND CURETTAGE /HYSTEROSCOPY;  Surgeon: Natale Milch, MD;  Location: [facility] ORS;  Service: Gynecology;  Laterality: N/A;  . IR KYPHO EA ADDL LEVEL THORACIC OR LUMBAR  11/22/2022  . IR KYPHO LUMBAR INC FX REDUCE BONE BX UNI/BIL CANNULATION INC/IMAGING  11/22/2022  . IR RADIOLOGIST EVAL & MGMT  07/26/2017  . LAPAROSCOPIC RIGHT COLECTOMY N/A 09/04/2018   Procedure: LAPAROSCOPIC ASSISTED SIGMOID COLECTOMY WITH REPAIR OF FISTULA TO BLADDER;  Surgeon: Griselda Miner, MD;  Location: [facility] ORS;  Service: General;  Laterality: N/A;  . RIGHT/LEFT HEART CATH AND CORONARY ANGIOGRAPHY N/A 12/10/2020   Procedure: RIGHT/LEFT HEART CATH AND CORONARY ANGIOGRAPHY;  Surgeon: Laurey Morale, MD;  Location: [facility] INVASIVE CV LAB;  Service: Cardiovascular;  Laterality: N/A;  . SPLIT NIGHT STUDY  02/02/2016       Current Outpatient Medications on File Prior to Visit  Medication Sig  Dispense Refill  . acetaminophen (TYLENOL) 500 MG tablet Take 2 tablets (1,000 mg total) by mouth every 8 (eight) hours for 5 days, THEN 2 tablets (1,000 mg total) every 8 (eight) hours as needed for up to 25  days. 30 tablet 0  . albuterol (VENTOLIN HFA) 108 (90 Base) MCG/ACT inhaler Inhale 2 puffs into the lungs every 6 (six) hours as needed for wheezing or shortness of breath. 8 g 0  . apixaban (ELIQUIS) 5 MG TABS tablet Take 1 tablet (5 mg total) by mouth 2 (two) times daily. 60 tablet   . budesonide-formoterol (SYMBICORT) 80-4.5 MCG/ACT inhaler Inhale 2 puffs into the lungs in the morning and at bedtime. (Patient taking differently: Inhale 2 puffs into the lungs See admin instructions. 2 puffs every morning, Nulty take an additional 2 puffs later in the day if needed for wheezing, shortness of breath.) 1 each 12  . clonazePAM (KLONOPIN) 0.5 MG tablet Take 1 tablet (0.5 mg total) by mouth 2 (two) times daily as needed. for anxiety 30 tablet 0  . cloNIDine (CATAPRES) 0.1 MG tablet Take 1 tablet by mouth twice daily 180 tablet 0  . ferrous sulfate 325 (65 FE) MG tablet Take 1 tablet (325 mg total) by mouth daily with breakfast. 90 tablet 3  . fluticasone (FLONASE) 50 MCG/ACT nasal spray Place 1 spray into both nostrils daily as needed for allergies or rhinitis.    Marland Kitchen. nitroGLYCERIN (NITROSTAT) 0.4 MG SL tablet DISSOLVE ONE TABLET UNDER THE TONGUE EVERY 5 MINUTES AS NEEDED FOR CHEST PAIN.  DO NOT EXCEED A TOTAL OF 3 DOSES IN 15 MINUTES (Patient taking differently: Place 0.4 mg under the tongue every 5 (five) minutes x 3 doses as needed for chest pain.) 25 tablet 8  . ondansetron (ZOFRAN-ODT) 4 MG disintegrating tablet Take 1 tablet (4 mg total) by mouth every 8 (eight) hours as needed for nausea or vomiting. 20 tablet 0  . polyethylene glycol powder (MIRALAX) 17 GM/SCOOP powder Take 17 g by mouth 2 (two) times daily as needed for mild constipation. 255 g 2  . ranolazine (RANEXA) 500 MG 12 hr tablet Take 500  mg by mouth daily as needed (chest pain).    Marland Kitchen. senna-docusate (SENOKOT-S) 8.6-50 MG tablet Take 2 tablets by mouth 2 (two) times daily between meals as needed for moderate constipation. 180 tablet 0  . torsemide (DEMADEX) 20 MG tablet Take 40 mg by mouth 2 (two) times daily.    . famotidine (PEPCID) 20 MG tablet Take 20 mg by mouth at bedtime as needed for heartburn. (Patient not taking: Reported on 12/16/2022)    . losartan (COZAAR) 100 MG tablet Take 1 tablet by mouth once daily (Patient not taking: Reported on 12/16/2022) 90 tablet 0  . spironolactone (ALDACTONE) 25 MG tablet Take 1 tablet (25 mg total) by mouth daily. (Patient not taking: Reported on 12/16/2022) 90 tablet 3   No current facility-administered medications on file prior to visit.   Allergies  Allergen Reactions  . Contrast Media [Iodinated Contrast Media] Hives and Other (See Comments)    Chest pain and burning   . Clonidine Derivatives Other (See Comments)    Dry mouth, throat  . Statins Other (See Comments)    Myalgias  . Sulfa Antibiotics Diarrhea    Tremors   . Celebrex [Celecoxib] Rash  . Isordil [Isosorbide Nitrate] Itching and Rash  . Tape Itching and Rash    Redness at application site Reaction is to paper and plastic tapes and EKG leads   Social History   Socioeconomic History  . Marital status: Widowed    Spouse name: Not on file  . Number of children: 1  . Years of education: 6712  . Highest education level: 12th  grade  Occupational History  . Not on file  Tobacco Use  . Smoking status: Never    Passive exposure: Never  . Smokeless tobacco: Never  Vaping Use  . Vaping Use: Never used  Substance and Sexual Activity  . Alcohol use: No  . Drug use: No  . Sexual activity: Not Currently    Partners: Male  Other Topics Concern  . Not on file  Social History Narrative   Husband [name], passed away in Feb 09, 2022. Pt's son, Loletta Specter lives beside of patient and helps with her care.    Social  Determinants of Health   Financial Resource Strain: Low Risk  (11/08/2022)   Overall Financial Resource Strain (CARDIA)   . Difficulty of Paying Living Expenses: Not very hard  Recent Concern: Financial Resource Strain - Medium Risk (11/04/2022)   Overall Financial Resource Strain (CARDIA)   . Difficulty of Paying Living Expenses: Somewhat hard  Food Insecurity: No Food Insecurity (11/08/2022)   Hunger Vital Sign   . Worried About Programme researcher, broadcasting/film/video in the Last Year: Never true   . Ran Out of Food in the Last Year: Never true  Transportation Needs: No Transportation Needs (11/08/2022)   PRAPARE - Transportation   . Lack of Transportation (Medical): No   . Lack of Transportation (Non-Medical): No  Physical Activity: Sufficiently Active (11/08/2022)   Exercise Vital Sign   . Days of Exercise per Week: 5 days   . Minutes of Exercise per Session: 30 min  Recent Concern: Physical Activity - Insufficiently Active (08/11/2022)   Exercise Vital Sign   . Days of Exercise per Week: 5 days   . Minutes of Exercise per Session: 10 min  Stress: No Stress Concern Present (11/08/2022)   [employer] of Occupational Health - Occupational Stress Questionnaire   . Feeling of Stress : Only a little  Social Connections: Moderately Integrated (11/08/2022)   Social Connection and Isolation Panel [NHANES]   . Frequency of Communication with Friends and Family: More than three times a week   . Frequency of Social Gatherings with Friends and Family: More than three times a week   . Attends Religious Services: More than 4 times per year   . Active Member of Clubs or Organizations: Yes   . Attends Banker Meetings: More than 4 times per year   . Marital Status: Widowed  Recent Concern: Social Connections - Moderately Isolated (08/11/2022)   Social Connection and Isolation Panel [NHANES]   . Frequency of Communication with Friends and Family: More than three times a week   . Frequency of Social  Gatherings with Friends and Family: Twice a week   . Attends Religious Services: More than 4 times per year   . Active Member of Clubs or Organizations: No   . Attends Banker Meetings: Never   . Marital Status: Widowed  Intimate Partner Violence: Not At Risk (11/08/2022)   Humiliation, Afraid, Rape, and Kick questionnaire   . Fear of Current or Ex-Partner: No   . Emotionally Abused: No   . Physically Abused: No   . Sexually Abused: No      Review of Systems  Musculoskeletal:  Positive for back pain.  All other systems reviewed and are negative.      Objective:   Physical Exam Vitals reviewed.  Constitutional:      General: She is not in acute distress.    Appearance: Normal appearance. She is well-developed and normal weight. She  is not diaphoretic.  HENT:     Nose: No congestion or rhinorrhea.     Mouth/Throat:     Mouth: Mucous membranes are moist.     Pharynx: No oropharyngeal exudate or posterior oropharyngeal erythema.  Eyes:     General:        Right eye: No discharge.        Left eye: No discharge.     Conjunctiva/sclera: Conjunctivae normal.  Neck:     Vascular: No carotid bruit.  Cardiovascular:     Rate and Rhythm: Normal rate and regular rhythm.     Heart sounds: Murmur heard.     No friction rub. No gallop.  Pulmonary:     Effort: Pulmonary effort is normal. No accessory muscle usage, prolonged expiration or respiratory distress.     Breath sounds: Normal breath sounds and air entry. No stridor, decreased air movement or transmitted upper airway sounds. No decreased breath sounds, wheezing, rhonchi or rales.  Chest:     Chest wall: No mass, lacerations, deformity, swelling or crepitus.  Abdominal:     General: Abdomen is flat. Bowel sounds are decreased. There is no distension.     Palpations: Abdomen is not rigid.     Tenderness: There is no abdominal tenderness. There is no guarding or rebound.     Hernia: No hernia is present.   Musculoskeletal:     Cervical back: No rigidity or tenderness.     Right hip: Tenderness and bony tenderness present. Decreased range of motion.     Right lower leg: No edema.     Left lower leg: No edema.  Skin:    Findings: No bruising or lesion.  Neurological:     Mental Status: She is alert. Mental status is at baseline.     Motor: No abnormal muscle tone.     Coordination: Coordination normal.     Gait: Gait normal.     Deep Tendon Reflexes: Reflexes normal.  Psychiatric:        Mood and Affect: Mood normal.        Behavior: Behavior normal.        Thought Content: Thought content normal.        Judgment: Judgment normal.         Assessment & Plan:  Hyponatremia - Plan: CBC with Differential/Platelet, BASIC METABOLIC PANEL WITH GFR  Closed compression fracture of L3 lumbar vertebra, initial encounter - Plan: Ambulatory referral to Home Health  Right hip pain - Plan: DG Hip Unilat W OR W/O Pelvis 2-3 Views Right, Ambulatory referral to Home Health Patient appears euvolemic today.  She is not significantly fluid overloaded.  Therefore I will recheck her sodium level and if it is stable, I will make no changes in her dose of diuretic.  I will refill her oxycodone but cautioned her to use this sparingly and also recommended that she not take oxycodone with Klonopin to avoid confusion and falls.  I will consult home health nursing to try to arrange assistance at home with bathing at least once a week.  She Lackie also benefit from physical therapy particularly with standing and walking and gait training.  Obtain an x-ray of the hip and the pelvis on the right side due to the significance of the pain.

## 2022-12-16 NOTE — Telephone Encounter (Signed)
Patient aware of lab results and medication changes.  She will call to set up labs.

## 2022-12-16 NOTE — Telephone Encounter (Signed)
[name] with [facility] called in to get some verbal orders for this pt:  Pt will need to be seen:  1x a week for 2 weeks EOW for 4 weeks [name] need an order for PT as well.  Please advise.

## 2022-12-17 ENCOUNTER — Other Ambulatory Visit: Payer: Self-pay | Admitting: Family Medicine

## 2022-12-17 ENCOUNTER — Telehealth: Payer: Self-pay

## 2022-12-17 DIAGNOSIS — M48061 Spinal stenosis, lumbar region without neurogenic claudication: Secondary | ICD-10-CM | POA: Diagnosis not present

## 2022-12-17 DIAGNOSIS — I130 Hypertensive heart and chronic kidney disease with heart failure and stage 1 through stage 4 chronic kidney disease, or unspecified chronic kidney disease: Secondary | ICD-10-CM | POA: Diagnosis not present

## 2022-12-17 DIAGNOSIS — F419 Anxiety disorder, unspecified: Secondary | ICD-10-CM | POA: Diagnosis not present

## 2022-12-17 DIAGNOSIS — M47816 Spondylosis without myelopathy or radiculopathy, lumbar region: Secondary | ICD-10-CM | POA: Diagnosis not present

## 2022-12-17 DIAGNOSIS — J309 Allergic rhinitis, unspecified: Secondary | ICD-10-CM | POA: Diagnosis not present

## 2022-12-17 DIAGNOSIS — D509 Iron deficiency anemia, unspecified: Secondary | ICD-10-CM | POA: Diagnosis not present

## 2022-12-17 DIAGNOSIS — I4819 Other persistent atrial fibrillation: Secondary | ICD-10-CM | POA: Diagnosis not present

## 2022-12-17 DIAGNOSIS — S32030D Wedge compression fracture of third lumbar vertebra, subsequent encounter for fracture with routine healing: Secondary | ICD-10-CM | POA: Diagnosis not present

## 2022-12-17 DIAGNOSIS — I350 Nonrheumatic aortic (valve) stenosis: Secondary | ICD-10-CM | POA: Diagnosis not present

## 2022-12-17 DIAGNOSIS — U071 COVID-19: Secondary | ICD-10-CM | POA: Diagnosis not present

## 2022-12-17 DIAGNOSIS — I2510 Atherosclerotic heart disease of native coronary artery without angina pectoris: Secondary | ICD-10-CM | POA: Diagnosis not present

## 2022-12-17 DIAGNOSIS — N1832 Chronic kidney disease, stage 3b: Secondary | ICD-10-CM | POA: Diagnosis not present

## 2022-12-17 DIAGNOSIS — M1990 Unspecified osteoarthritis, unspecified site: Secondary | ICD-10-CM | POA: Diagnosis not present

## 2022-12-17 DIAGNOSIS — J449 Chronic obstructive pulmonary disease, unspecified: Secondary | ICD-10-CM | POA: Diagnosis not present

## 2022-12-17 DIAGNOSIS — I5181 Takotsubo syndrome: Secondary | ICD-10-CM | POA: Diagnosis not present

## 2022-12-17 DIAGNOSIS — I5030 Unspecified diastolic (congestive) heart failure: Secondary | ICD-10-CM | POA: Diagnosis not present

## 2022-12-17 LAB — BASIC METABOLIC PANEL WITH GFR
BUN/Creatinine Ratio: 43 (calc) — ABNORMAL HIGH (ref 6–22)
BUN: 57 mg/dL — ABNORMAL HIGH (ref 7–25)
CO2: 26 mmol/L (ref 20–32)
Calcium: 9.9 mg/dL (ref 8.6–10.4)
Chloride: 93 mmol/L — ABNORMAL LOW (ref 98–110)
Creat: 1.33 mg/dL — ABNORMAL HIGH (ref 0.60–0.95)
Glucose, Bld: 107 mg/dL — ABNORMAL HIGH (ref 65–99)
Potassium: 5.3 mmol/L (ref 3.5–5.3)
Sodium: 130 mmol/L — ABNORMAL LOW (ref 135–146)
eGFR: 39 mL/min/{1.73_m2} — ABNORMAL LOW (ref >=60)

## 2022-12-17 LAB — CBC WITH DIFFERENTIAL/PLATELET
Absolute Monocytes: 625 cells/uL (ref 200–950)
Basophils Absolute: 21 cells/uL (ref 0–200)
Basophils Relative: 0.4 %
Eosinophils Absolute: 32 cells/uL (ref 15–500)
Eosinophils Relative: 0.6 %
HCT: 31.9 % — ABNORMAL LOW (ref 35.0–45.0)
Hemoglobin: 10.4 g/dL — ABNORMAL LOW (ref 11.7–15.5)
Lymphs Abs: 1373 cells/uL (ref 850–3900)
MCH: 30.7 pg (ref 27.0–33.0)
MCHC: 32.6 g/dL (ref 32.0–36.0)
MCV: 94.1 fL (ref 80.0–100.0)
MPV: 9.3 fL (ref 7.5–12.5)
Monocytes Relative: 11.8 %
Neutro Abs: 3249 cells/uL (ref 1500–7800)
Neutrophils Relative %: 61.3 %
Platelets: 323 10*3/uL (ref 140–400)
RBC: 3.39 10*6/uL — ABNORMAL LOW (ref 3.80–5.10)
RDW: 12 % (ref 11.0–15.0)
Total Lymphocyte: 25.9 %
WBC: 5.3 10*3/uL (ref 3.8–10.8)

## 2022-12-17 MED ORDER — ONDANSETRON 4 MG PO TBDP: 4.0000 mg | ORAL_TABLET | Freq: Three times a day (TID) | ORAL | 0 refills | Status: DC | PRN

## 2022-12-17 NOTE — Telephone Encounter (Signed)
Pt called in to ask if pcp would call in a refill of this med ondansetron (ZOFRAN-ODT) 4 MG disintegrating tablet [[id number]]. Pt states she is still feeling sick on her stomach. Please advise  LOV: 12/16/22  PHARMACY: [facility] [address] [place] - [address]  [address] [place] [address]    CB#: [phone]

## 2022-12-20 ENCOUNTER — Telehealth: Payer: Self-pay

## 2022-12-20 NOTE — Telephone Encounter (Signed)
Pt called in wanting to discuss results from xray. Please advise.   Cb#: [phone]

## 2022-12-21 ENCOUNTER — Other Ambulatory Visit: Payer: Self-pay | Admitting: Family Medicine

## 2022-12-21 ENCOUNTER — Other Ambulatory Visit: Payer: Self-pay

## 2022-12-21 MED ORDER — PREDNISONE 20 MG PO TABS: ORAL_TABLET | ORAL | 0 refills | Status: DC

## 2022-12-22 DIAGNOSIS — I5181 Takotsubo syndrome: Secondary | ICD-10-CM | POA: Diagnosis not present

## 2022-12-22 DIAGNOSIS — N1832 Chronic kidney disease, stage 3b: Secondary | ICD-10-CM | POA: Diagnosis not present

## 2022-12-22 DIAGNOSIS — J449 Chronic obstructive pulmonary disease, unspecified: Secondary | ICD-10-CM | POA: Diagnosis not present

## 2022-12-22 DIAGNOSIS — M1990 Unspecified osteoarthritis, unspecified site: Secondary | ICD-10-CM | POA: Diagnosis not present

## 2022-12-22 DIAGNOSIS — I350 Nonrheumatic aortic (valve) stenosis: Secondary | ICD-10-CM | POA: Diagnosis not present

## 2022-12-22 DIAGNOSIS — I5030 Unspecified diastolic (congestive) heart failure: Secondary | ICD-10-CM | POA: Diagnosis not present

## 2022-12-22 DIAGNOSIS — F419 Anxiety disorder, unspecified: Secondary | ICD-10-CM | POA: Diagnosis not present

## 2022-12-22 DIAGNOSIS — M47816 Spondylosis without myelopathy or radiculopathy, lumbar region: Secondary | ICD-10-CM | POA: Diagnosis not present

## 2022-12-22 DIAGNOSIS — M48061 Spinal stenosis, lumbar region without neurogenic claudication: Secondary | ICD-10-CM | POA: Diagnosis not present

## 2022-12-22 DIAGNOSIS — I4819 Other persistent atrial fibrillation: Secondary | ICD-10-CM | POA: Diagnosis not present

## 2022-12-22 DIAGNOSIS — S32030D Wedge compression fracture of third lumbar vertebra, subsequent encounter for fracture with routine healing: Secondary | ICD-10-CM | POA: Diagnosis not present

## 2022-12-22 DIAGNOSIS — J309 Allergic rhinitis, unspecified: Secondary | ICD-10-CM | POA: Diagnosis not present

## 2022-12-22 DIAGNOSIS — I2510 Atherosclerotic heart disease of native coronary artery without angina pectoris: Secondary | ICD-10-CM | POA: Diagnosis not present

## 2022-12-22 DIAGNOSIS — U071 COVID-19: Secondary | ICD-10-CM | POA: Diagnosis not present

## 2022-12-22 DIAGNOSIS — D509 Iron deficiency anemia, unspecified: Secondary | ICD-10-CM | POA: Diagnosis not present

## 2022-12-22 DIAGNOSIS — I130 Hypertensive heart and chronic kidney disease with heart failure and stage 1 through stage 4 chronic kidney disease, or unspecified chronic kidney disease: Secondary | ICD-10-CM | POA: Diagnosis not present

## 2022-12-23 ENCOUNTER — Encounter: Payer: Self-pay | Admitting: Podiatry

## 2022-12-23 DIAGNOSIS — I2510 Atherosclerotic heart disease of native coronary artery without angina pectoris: Secondary | ICD-10-CM | POA: Diagnosis not present

## 2022-12-23 DIAGNOSIS — S32030D Wedge compression fracture of third lumbar vertebra, subsequent encounter for fracture with routine healing: Secondary | ICD-10-CM | POA: Diagnosis not present

## 2022-12-23 DIAGNOSIS — U071 COVID-19: Secondary | ICD-10-CM | POA: Diagnosis not present

## 2022-12-23 DIAGNOSIS — I5181 Takotsubo syndrome: Secondary | ICD-10-CM | POA: Diagnosis not present

## 2022-12-23 DIAGNOSIS — I4819 Other persistent atrial fibrillation: Secondary | ICD-10-CM | POA: Diagnosis not present

## 2022-12-23 DIAGNOSIS — N1832 Chronic kidney disease, stage 3b: Secondary | ICD-10-CM | POA: Diagnosis not present

## 2022-12-23 DIAGNOSIS — M48061 Spinal stenosis, lumbar region without neurogenic claudication: Secondary | ICD-10-CM | POA: Diagnosis not present

## 2022-12-23 DIAGNOSIS — I350 Nonrheumatic aortic (valve) stenosis: Secondary | ICD-10-CM | POA: Diagnosis not present

## 2022-12-23 DIAGNOSIS — M1990 Unspecified osteoarthritis, unspecified site: Secondary | ICD-10-CM | POA: Diagnosis not present

## 2022-12-23 DIAGNOSIS — D509 Iron deficiency anemia, unspecified: Secondary | ICD-10-CM | POA: Diagnosis not present

## 2022-12-23 DIAGNOSIS — I130 Hypertensive heart and chronic kidney disease with heart failure and stage 1 through stage 4 chronic kidney disease, or unspecified chronic kidney disease: Secondary | ICD-10-CM | POA: Diagnosis not present

## 2022-12-23 DIAGNOSIS — J309 Allergic rhinitis, unspecified: Secondary | ICD-10-CM | POA: Diagnosis not present

## 2022-12-23 DIAGNOSIS — J449 Chronic obstructive pulmonary disease, unspecified: Secondary | ICD-10-CM | POA: Diagnosis not present

## 2022-12-23 DIAGNOSIS — M47816 Spondylosis without myelopathy or radiculopathy, lumbar region: Secondary | ICD-10-CM | POA: Diagnosis not present

## 2022-12-23 DIAGNOSIS — F419 Anxiety disorder, unspecified: Secondary | ICD-10-CM | POA: Diagnosis not present

## 2022-12-23 DIAGNOSIS — I5030 Unspecified diastolic (congestive) heart failure: Secondary | ICD-10-CM | POA: Diagnosis not present

## 2022-12-24 ENCOUNTER — Ambulatory Visit (INDEPENDENT_AMBULATORY_CARE_PROVIDER_SITE_OTHER): Payer: Medicare Other | Admitting: Family Medicine

## 2022-12-24 ENCOUNTER — Encounter: Payer: Self-pay | Admitting: Family Medicine

## 2022-12-24 VITALS — BP 138/68 | HR 59 | Temp 97.6°F | Ht 65.0 in | Wt 167.6 lb

## 2022-12-24 DIAGNOSIS — D509 Iron deficiency anemia, unspecified: Secondary | ICD-10-CM | POA: Diagnosis not present

## 2022-12-24 DIAGNOSIS — U071 COVID-19: Secondary | ICD-10-CM | POA: Diagnosis not present

## 2022-12-24 DIAGNOSIS — K5900 Constipation, unspecified: Secondary | ICD-10-CM | POA: Diagnosis not present

## 2022-12-24 DIAGNOSIS — S32030D Wedge compression fracture of third lumbar vertebra, subsequent encounter for fracture with routine healing: Secondary | ICD-10-CM | POA: Diagnosis not present

## 2022-12-24 DIAGNOSIS — N1832 Chronic kidney disease, stage 3b: Secondary | ICD-10-CM | POA: Diagnosis not present

## 2022-12-24 DIAGNOSIS — M47816 Spondylosis without myelopathy or radiculopathy, lumbar region: Secondary | ICD-10-CM | POA: Diagnosis not present

## 2022-12-24 DIAGNOSIS — M48061 Spinal stenosis, lumbar region without neurogenic claudication: Secondary | ICD-10-CM | POA: Diagnosis not present

## 2022-12-24 DIAGNOSIS — J309 Allergic rhinitis, unspecified: Secondary | ICD-10-CM | POA: Diagnosis not present

## 2022-12-24 DIAGNOSIS — I350 Nonrheumatic aortic (valve) stenosis: Secondary | ICD-10-CM | POA: Diagnosis not present

## 2022-12-24 DIAGNOSIS — I4819 Other persistent atrial fibrillation: Secondary | ICD-10-CM | POA: Diagnosis not present

## 2022-12-24 DIAGNOSIS — I2510 Atherosclerotic heart disease of native coronary artery without angina pectoris: Secondary | ICD-10-CM | POA: Diagnosis not present

## 2022-12-24 DIAGNOSIS — I5181 Takotsubo syndrome: Secondary | ICD-10-CM | POA: Diagnosis not present

## 2022-12-24 DIAGNOSIS — I130 Hypertensive heart and chronic kidney disease with heart failure and stage 1 through stage 4 chronic kidney disease, or unspecified chronic kidney disease: Secondary | ICD-10-CM | POA: Diagnosis not present

## 2022-12-24 DIAGNOSIS — I5030 Unspecified diastolic (congestive) heart failure: Secondary | ICD-10-CM | POA: Diagnosis not present

## 2022-12-24 DIAGNOSIS — J449 Chronic obstructive pulmonary disease, unspecified: Secondary | ICD-10-CM | POA: Diagnosis not present

## 2022-12-24 DIAGNOSIS — F419 Anxiety disorder, unspecified: Secondary | ICD-10-CM | POA: Diagnosis not present

## 2022-12-24 DIAGNOSIS — M1990 Unspecified osteoarthritis, unspecified site: Secondary | ICD-10-CM | POA: Diagnosis not present

## 2022-12-24 MED ORDER — BISACODYL 10 MG RE SUPP: 10.0000 mg | RECTAL | 0 refills | Status: DC | PRN

## 2022-12-24 MED ORDER — MAGNESIUM CITRATE PO SOLN: 1.0000 | Freq: Once | ORAL | 0 refills | Status: AC

## 2022-12-24 NOTE — Progress Notes (Signed)
Subjective:    Patient ID: [name], female    DOB: Pribyl 02, 1936, 88 y.o.   MRN: [id number] Patient states that she has not had a bowel movement in 4 days.  She states that she tried MiraLAX every day for 3 days.  She has been trying over-the-counter stool softener but she is not certain of the brand.  She states she even took a "magnesium" pill.  None of this is helping.  She tried an enema this morning and only a small amount came out after the enema.  She denies any abdominal pain or fevers. Past Medical History:  Diagnosis Date   Allergy    rhinitis   Aortic stenosis    mild by echo 06/2017   Arthritis    Bradycardia    a. 10/2017 -> beta blocker cut back due to HR 39.   Breast cancer 01/06/2012   Cancer    right colon and left breast   Chronic diastolic CHF (congestive heart failure)    Colon cancer 01/06/2012   Colovesical fistula    Dr. Carolynne Edouard and Dr. Laverle Patter planning surgery 08/2018- surgery revealed spontaneous closure   COPD (chronic obstructive pulmonary disease)    pt. denies   Coronary artery disease 2006   a.  NSTEMI in 2016, cath showed 15% prox-mid RCA, 20% prox LAD, EF 25-35% by cath and 35-40% -> felt due to Takotsubo cardiomyopathy.   Dilated aortic root    by echo 06/2017   Diverticulosis    Dyspnea    Edema extremities    GERD (gastroesophageal reflux disease)    Hernia    Hiatal hernia    denies   Hyperlipidemia    Hypertension    Mild aortic stenosis    echo 11/2015 but not noted on echo 06/2016   Osteopenia    Permanent atrial fibrillation    chronic atrial fibrillation   Pneumonia    hx child   Pulmonary HTN    a. moderate to severe PASP echo 11/2015 - now by echo 06/2017. CTA chest in 11/16 with no PE. PFTs in 7/15 with mild obstructive lung disease. She had a negative sleep study in 2017. b. Felt due to left sided HF.   Stroke    Takotsubo syndrome 07/29/2015   a. EF 35-40% by echo; akinesis of mid-apical anteroseptal and apical  myocardium.  EF now normalized on echo 11/2015   Past Surgical History:  Procedure Laterality Date   APPENDECTOMY     BREAST SURGERY     lumpectomy left   CARDIAC CATHETERIZATION     CARDIAC CATHETERIZATION N/A 07/28/2015   Procedure: Left Heart Cath and Coronary Angiography;  Surgeon: Peter M Swaziland, MD;  Location: [facility] INVASIVE CV LAB;  Service: Cardiovascular;  Laterality: N/A;   CHOLECYSTECTOMY     COLECTOMY     right side   CYSTOSCOPY WITH STENT PLACEMENT Left 09/04/2018   Procedure: CYSTOSCOPY WITH LEFT STENT PLACEMENT, BLADDER REPAIR, CYSTOSCOPY WITH LEFT STENT REMOVAL;  Surgeon: Heloise Purpura, MD;  Location: [facility] ORS;  Service: Urology;  Laterality: Left;   EXCISION OF ACCESSORY NIPPLE Bilateral 05/30/2013   Procedure: BILATERAL NIPPLE BIOPSY;  Surgeon: Robyne Askew, MD;  Location: [facility] OR;  Service: General;  Laterality: Bilateral;   EYE SURGERY Bilateral 12   cataracts   HYSTEROSCOPY WITH D & C N/A 05/29/2020   Procedure: DILATATION AND CURETTAGE /HYSTEROSCOPY;  Surgeon: Natale Milch, MD;  Location: [facility] ORS;  Service: Gynecology;  Laterality: N/A;   IR KYPHO EA ADDL LEVEL THORACIC OR LUMBAR  11/22/2022   IR KYPHO LUMBAR INC FX REDUCE BONE BX UNI/BIL CANNULATION INC/IMAGING  11/22/2022   IR RADIOLOGIST EVAL & MGMT  07/26/2017   LAPAROSCOPIC RIGHT COLECTOMY N/A 09/04/2018   Procedure: LAPAROSCOPIC ASSISTED SIGMOID COLECTOMY WITH REPAIR OF FISTULA TO BLADDER;  Surgeon: Griselda Miner, MD;  Location: [facility] ORS;  Service: General;  Laterality: N/A;   RIGHT/LEFT HEART CATH AND CORONARY ANGIOGRAPHY N/A 12/10/2020   Procedure: RIGHT/LEFT HEART CATH AND CORONARY ANGIOGRAPHY;  Surgeon: Laurey Morale, MD;  Location: [facility] INVASIVE CV LAB;  Service: Cardiovascular;  Laterality: N/A;   SPLIT NIGHT STUDY  02/02/2016       Current Outpatient Medications on File Prior to Visit  Medication Sig Dispense Refill   acetaminophen (TYLENOL) 500 MG tablet Take 2 tablets (1,000 mg total) by mouth  every 8 (eight) hours for 5 days, THEN 2 tablets (1,000 mg total) every 8 (eight) hours as needed for up to 25 days. 30 tablet 0   albuterol (VENTOLIN HFA) 108 (90 Base) MCG/ACT inhaler Inhale 2 puffs into the lungs every 6 (six) hours as needed for wheezing or shortness of breath. 8 g 0   apixaban (ELIQUIS) 5 MG TABS tablet Take 1 tablet (5 mg total) by mouth 2 (two) times daily. 60 tablet    budesonide-formoterol (SYMBICORT) 80-4.5 MCG/ACT inhaler Inhale 2 puffs into the lungs in the morning and at bedtime. (Patient taking differently: Inhale 2 puffs into the lungs See admin instructions. 2 puffs every morning, Reznick take an additional 2 puffs later in the day if needed for wheezing, shortness of breath.) 1 each 12   clonazePAM (KLONOPIN) 0.5 MG tablet Take 1 tablet (0.5 mg total) by mouth 2 (two) times daily as needed. for anxiety 30 tablet 0   cloNIDine (CATAPRES) 0.1 MG tablet Take 1 tablet by mouth twice daily 180 tablet 0   famotidine (PEPCID) 20 MG tablet Take 20 mg by mouth at bedtime as needed for heartburn.     ferrous sulfate 325 (65 FE) MG tablet Take 1 tablet (325 mg total) by mouth daily with breakfast. 90 tablet 3   fluticasone (FLONASE) 50 MCG/ACT nasal spray Place 1 spray into both nostrils daily as needed for allergies or rhinitis.     nitroGLYCERIN (NITROSTAT) 0.4 MG SL tablet DISSOLVE ONE TABLET UNDER THE TONGUE EVERY 5 MINUTES AS NEEDED FOR CHEST PAIN.  DO NOT EXCEED A TOTAL OF 3 DOSES IN 15 MINUTES (Patient taking differently: Place 0.4 mg under the tongue every 5 (five) minutes x 3 doses as needed for chest pain.) 25 tablet 8   ondansetron (ZOFRAN-ODT) 4 MG disintegrating tablet Take 1 tablet (4 mg total) by mouth every 8 (eight) hours as needed for nausea or vomiting. 20 tablet 0   oxyCODONE (OXY IR/ROXICODONE) 5 MG immediate release tablet Take 5 mg by mouth every 6 (six) hours as needed.     oxyCODONE-acetaminophen (PERCOCET/ROXICET) 5-325 MG tablet Take 1 tablet by mouth every 4  (four) hours as needed for severe pain. 30 tablet 0   polyethylene glycol powder (MIRALAX) 17 GM/SCOOP powder Take 17 g by mouth 2 (two) times daily as needed for mild constipation. 255 g 2   predniSONE (DELTASONE) 20 MG tablet 3 tabs poqday 1-2, 2 tabs poqday 3-4, 1 tab poqday 5-6 12 tablet 0   ranolazine (RANEXA) 500 MG 12 hr tablet Take 500 mg by mouth daily as needed (chest pain).  senna-docusate (SENOKOT-S) 8.6-50 MG tablet Take 2 tablets by mouth 2 (two) times daily between meals as needed for moderate constipation. 180 tablet 0   torsemide (DEMADEX) 20 MG tablet Take 40 mg by mouth 2 (two) times daily.     No current facility-administered medications on file prior to visit.   Allergies  Allergen Reactions   Contrast Media [Iodinated Contrast Media] Hives and Other (See Comments)    Chest pain and burning    Clonidine Derivatives Other (See Comments)    Dry mouth, throat   Statins Other (See Comments)    Myalgias   Sulfa Antibiotics Diarrhea    Tremors    Celebrex [Celecoxib] Rash   Isordil [Isosorbide Nitrate] Itching and Rash   Tape Itching and Rash    Redness at application site Reaction is to paper and plastic tapes and EKG leads   Social History   Socioeconomic History   Marital status: Widowed    Spouse name: Not on file   Number of children: 1   Years of education: 58   Highest education level: 12th grade  Occupational History   Not on file  Tobacco Use   Smoking status: Never    Passive exposure: Never   Smokeless tobacco: Never  Vaping Use   Vaping Use: Never used  Substance and Sexual Activity   Alcohol use: No   Drug use: No   Sexual activity: Not Currently    Partners: Male  Other Topics Concern   Not on file  Social History Narrative   Husband [name], passed away in 2022-01-22. Pt's son, Loletta Specter lives beside of patient and helps with her care.    Social Determinants of Health   Financial Resource Strain: Low Risk  (11/08/2022)   Overall  Financial Resource Strain (CARDIA)    Difficulty of Paying Living Expenses: Not very hard  Recent Concern: Financial Resource Strain - Medium Risk (11/04/2022)   Overall Financial Resource Strain (CARDIA)    Difficulty of Paying Living Expenses: Somewhat hard  Food Insecurity: No Food Insecurity (11/08/2022)   Hunger Vital Sign    Worried About Running Out of Food in the Last Year: Never true    Ran Out of Food in the Last Year: Never true  Transportation Needs: No Transportation Needs (11/08/2022)   PRAPARE - Administrator, Civil Service (Medical): No    Lack of Transportation (Non-Medical): No  Physical Activity: Sufficiently Active (11/08/2022)   Exercise Vital Sign    Days of Exercise per Week: 5 days    Minutes of Exercise per Session: 30 min  Recent Concern: Physical Activity - Insufficiently Active (08/11/2022)   Exercise Vital Sign    Days of Exercise per Week: 5 days    Minutes of Exercise per Session: 10 min  Stress: No Stress Concern Present (11/08/2022)   [employer] of Occupational Health - Occupational Stress Questionnaire    Feeling of Stress : Only a little  Social Connections: Moderately Integrated (11/08/2022)   Social Connection and Isolation Panel [NHANES]    Frequency of Communication with Friends and Family: More than three times a week    Frequency of Social Gatherings with Friends and Family: More than three times a week    Attends Religious Services: More than 4 times per year    Active Member of [employer] or Organizations: Yes    Attends Banker Meetings: More than 4 times per year    Marital Status: Widowed  Recent Concern:  Social Connections - Moderately Isolated (08/11/2022)   Social Connection and Isolation Panel [NHANES]    Frequency of Communication with Friends and Family: More than three times a week    Frequency of Social Gatherings with Friends and Family: Twice a week    Attends Religious Services: More than 4 times per year     Active Member of [employer] or Organizations: No    Attends Banker Meetings: Never    Marital Status: Widowed  Intimate Partner Violence: Not At Risk (11/08/2022)   Humiliation, Afraid, Rape, and Kick questionnaire    Fear of Current or Ex-Partner: No    Emotionally Abused: No    Physically Abused: No    Sexually Abused: No      Review of Systems  Musculoskeletal:  Positive for back pain.  All other systems reviewed and are negative.      Objective:   Physical Exam Vitals reviewed.  Constitutional:      General: She is not in acute distress.    Appearance: Normal appearance. She is well-developed and normal weight. She is not diaphoretic.  HENT:     Nose: No congestion or rhinorrhea.     Mouth/Throat:     Mouth: Mucous membranes are moist.     Pharynx: No oropharyngeal exudate or posterior oropharyngeal erythema.  Eyes:     General:        Right eye: No discharge.        Left eye: No discharge.     Conjunctiva/sclera: Conjunctivae normal.  Neck:     Vascular: No carotid bruit.  Cardiovascular:     Rate and Rhythm: Normal rate and regular rhythm.     Heart sounds: Murmur heard.     No friction rub. No gallop.  Pulmonary:     Effort: Pulmonary effort is normal. No accessory muscle usage, prolonged expiration or respiratory distress.     Breath sounds: Normal breath sounds and air entry. No stridor, decreased air movement or transmitted upper airway sounds. No decreased breath sounds, wheezing, rhonchi or rales.  Chest:     Chest wall: No mass, lacerations, deformity, swelling or crepitus.  Abdominal:     General: Abdomen is flat. Bowel sounds are decreased. There is no distension.     Palpations: Abdomen is not rigid.     Tenderness: There is no abdominal tenderness. There is no guarding or rebound.     Hernia: No hernia is present.  Musculoskeletal:     Cervical back: No rigidity or tenderness.     Right hip: Tenderness and bony tenderness present. Decreased  range of motion.     Right lower leg: No edema.     Left lower leg: No edema.  Skin:    Findings: No bruising or lesion.  Neurological:     Mental Status: She is alert. Mental status is at baseline.     Motor: No abnormal muscle tone.     Coordination: Coordination normal.     Gait: Gait normal.     Deep Tendon Reflexes: Reflexes normal.  Psychiatric:        Mood and Affect: Mood normal.        Behavior: Behavior normal.        Thought Content: Thought content normal.        Judgment: Judgment normal.          Assessment & Plan:  Constipation, unspecified constipation type I suspect the constipation is due to immobility and increased narcotic use  for pain.  I recommended the patient use MiraLAX 3 times a day for the next 24 hours in an effort to soften stool sufficiently.  If she has not had a bowel movement after 24 hours, I gave her a prescription for bisacodyl 10 mg rectal suppository to be used tomorrow as a stimulant to try to increase intestinal motility and push out the softened stool.  If this does not work, the next step will be to use magnesium citrate on Sunday.  Patient's last creatinine was 1.3 so I believe that this is acceptable given that she has tried and failed numerous other medications.

## 2022-12-27 ENCOUNTER — Other Ambulatory Visit: Payer: Self-pay | Admitting: Family Medicine

## 2022-12-27 ENCOUNTER — Encounter: Payer: Self-pay | Admitting: *Deleted

## 2022-12-27 ENCOUNTER — Telehealth: Payer: Self-pay

## 2022-12-27 ENCOUNTER — Ambulatory Visit: Payer: Self-pay | Admitting: *Deleted

## 2022-12-27 DIAGNOSIS — S32030D Wedge compression fracture of third lumbar vertebra, subsequent encounter for fracture with routine healing: Secondary | ICD-10-CM | POA: Diagnosis not present

## 2022-12-27 DIAGNOSIS — I4819 Other persistent atrial fibrillation: Secondary | ICD-10-CM | POA: Diagnosis not present

## 2022-12-27 DIAGNOSIS — I5181 Takotsubo syndrome: Secondary | ICD-10-CM | POA: Diagnosis not present

## 2022-12-27 DIAGNOSIS — M47816 Spondylosis without myelopathy or radiculopathy, lumbar region: Secondary | ICD-10-CM | POA: Diagnosis not present

## 2022-12-27 DIAGNOSIS — N1832 Chronic kidney disease, stage 3b: Secondary | ICD-10-CM | POA: Diagnosis not present

## 2022-12-27 DIAGNOSIS — I130 Hypertensive heart and chronic kidney disease with heart failure and stage 1 through stage 4 chronic kidney disease, or unspecified chronic kidney disease: Secondary | ICD-10-CM | POA: Diagnosis not present

## 2022-12-27 DIAGNOSIS — I2510 Atherosclerotic heart disease of native coronary artery without angina pectoris: Secondary | ICD-10-CM | POA: Diagnosis not present

## 2022-12-27 DIAGNOSIS — D509 Iron deficiency anemia, unspecified: Secondary | ICD-10-CM | POA: Diagnosis not present

## 2022-12-27 DIAGNOSIS — F419 Anxiety disorder, unspecified: Secondary | ICD-10-CM | POA: Diagnosis not present

## 2022-12-27 DIAGNOSIS — U071 COVID-19: Secondary | ICD-10-CM | POA: Diagnosis not present

## 2022-12-27 DIAGNOSIS — M48061 Spinal stenosis, lumbar region without neurogenic claudication: Secondary | ICD-10-CM | POA: Diagnosis not present

## 2022-12-27 DIAGNOSIS — I5030 Unspecified diastolic (congestive) heart failure: Secondary | ICD-10-CM | POA: Diagnosis not present

## 2022-12-27 DIAGNOSIS — M1990 Unspecified osteoarthritis, unspecified site: Secondary | ICD-10-CM | POA: Diagnosis not present

## 2022-12-27 DIAGNOSIS — J449 Chronic obstructive pulmonary disease, unspecified: Secondary | ICD-10-CM | POA: Diagnosis not present

## 2022-12-27 DIAGNOSIS — J309 Allergic rhinitis, unspecified: Secondary | ICD-10-CM | POA: Diagnosis not present

## 2022-12-27 DIAGNOSIS — I350 Nonrheumatic aortic (valve) stenosis: Secondary | ICD-10-CM | POA: Diagnosis not present

## 2022-12-27 MED ORDER — OXYCODONE HCL 5 MG PO TABS: 5.0000 mg | ORAL_TABLET | Freq: Four times a day (QID) | ORAL | 0 refills | Status: DC | PRN

## 2022-12-27 NOTE — Telephone Encounter (Signed)
Pt called in to request a refill of this med:   oxyCODONE-acetaminophen (PERCOCET/ROXICET) 5-325 MG tablet [[id number]]  LOV: 12/24/22  Pt would also like to speak with nurse please. Pt states she is still experiencing extreme pain from constipation.   Cb#: [phone]

## 2022-12-27 NOTE — Patient Outreach (Signed)
Care Coordination   Follow Up Visit Note   12/27/2022  Name: [name] MRN: [id number] DOB: 07/07/1935  [name] is a 88 y.o. year old female who sees [name], [name], MD for primary care. I spoke with [name] by phone today.  What matters to the patients health and wellness today?  Provide Counseling & Supportive Services.     Goals Addressed               This Visit's Progress     Provide Counseling & Supportive Services. (pt-stated)   On track     Care Coordination Interventions:  Interventions Today    Flowsheet Row Most Recent Value  Chronic Disease   Chronic disease during today's visit Atrial Fibrillation (AFib), Hypertension (HTN), Congestive Heart Failure (CHF), Chronic Kidney Disease/End Stage Renal Disease (ESRD), Chronic Obstructive Pulmonary Disease (COPD), Other  [Feelings of Sadness & Frustration Regarding Loss of Independence.]  General Interventions   General Interventions Discussed/Reviewed General Interventions Discussed, General Interventions Reviewed, Annual Eye Exam, Durable Medical Equipment (DME), Vaccines, Health Screening, Community Resources, Level of Care, Communication with, Doctor Visits  [Primary Care Provider]  Vaccines COVID-19, Flu, Pneumonia, RSV, Shingles, Tetanus/Pertussis/Diphtheria  [Encouraged]  Doctor Visits Discussed/Reviewed Doctor Visits Discussed, Doctor Visits Reviewed, Annual Wellness Visits, PCP, Specialist  [Encouraged]  Health Screening Bone Density, Colonoscopy, Mammogram  [Encouraged]  Durable Medical Equipment (DME) BP Cuff  PCP/Specialist Visits Compliance with follow-up visit  [Encouraged]  Communication with PCP/Specialists, RN  Level of Care Adult Daycare, Applications, Assisted Living, Personal Care Services  Applications Medicaid, Personal Care Services  Exercise Interventions   Exercise Discussed/Reviewed Exercise Discussed, Exercise Reviewed, Physical Activity, Weight Managment, Assistive device use and  maintanence  [Encouraged]  Physical Activity Discussed/Reviewed Physical Activity Discussed, Physical Activity Reviewed, Types of exercise, Home Exercise Program (HEP)  [Encouraged]  Weight Management Weight maintenance  [Encouraged]  Education Interventions   Education Provided Provided Therapist, sports, Provided Education  Provided Verbal Education On Nutrition, Foot Care, Eye Care, Mental Health/Coping with Illness, Applications, Exercise, Medication, Insurance Plans, [employer], When to see the doctor  Applications Medicaid, Personal Care Services  Mental Health Interventions   Mental Health Discussed/Reviewed Mental Health Discussed, Mental Health Reviewed, Coping Strategies, Suicide, Crisis, Substance Abuse, Grief and Loss, Depression, Anxiety  Refer to Social Work for counseling regarding Anxiety/Coping, Depression, Grief and Loss  Nutrition Interventions   Nutrition Discussed/Reviewed Nutrition Discussed, Nutrition Reviewed, Portion sizes, Decreasing sugar intake, Decreasing salt, Decreasing fats, Increaing proteins, Fluid intake, Adding fruits and vegetables  [Encouraged]  Pharmacy Interventions   Pharmacy Dicussed/Reviewed Pharmacy Topics Discussed, Pharmacy Topics Reviewed, Medication Adherence, Affording Medications  Safety Interventions   Safety Discussed/Reviewed Safety Discussed, Safety Reviewed, Fall Risk, Home Safety  [Encouraged]  Home Engineer, water, Refer for community resources  Advanced Directive Interventions   Advanced Directives Discussed/Reviewed Advanced Directives Discussed, Advanced Directives Reviewed     Active Listening & Reflection Utilized.  Verbalization of Feelings Encouraged.  Emotional Support Offered. Feelings of Grief Validated. Symptoms of Grief Acknowledged. Grief & Loss Support Groups Reviewed. Self-Enrollment in Grief & Loss Support Group Emphasized. Problem Solving Interventions Implemented. Task-Centered Solutions  Activated.   Solution-Focused Strategies Employed. Acceptance & Commitment Therapy Indicated. Cognitive Behavioral Therapy Initiated. Client-Centered Therapy Performed. Deep Breathing Exercises, Relaxation Techniques & Mindfulness Meditation Strategies Encouraged Daily. Please Continue Contacting [employer], Wellsite geologist of Interest, in An Effort to Obtain Care & Supervision in The Home: ~ Adult Day Care Programs  ~ In-Home Care &  Respite Agencies ~ Home Health Care Agencies ~ Respite Care Agencies & Facilities ~ Aging, Disability & Transit Services of [facility] Please Contact CSW Directly [phone]), if You Have Questions, Need Assistance, or If Additional Social Work Needs Are Identified Between Now & Our Next Scheduled Telephone [employer].      SDOH assessments and interventions completed:  Yes.  Care Coordination Interventions:  Yes, provided.   Follow up plan: Follow up call scheduled for 01/10/2023 at 10:30 am.  Encounter Outcome:  Pt. Visit Completed.   Danford Bad, BSW, MSW, LCSW  Licensed Restaurant manager, [employer] [facility]  Mailing [place] [address], [place], [place] [address] Physical Address-[address] [place], [place] [address] Toll Free Main # [phone] Fax # [phone] Cell # [phone] [email or web address]

## 2022-12-27 NOTE — Patient Instructions (Signed)
Visit Information  Thank you for taking time to visit with me today. Please don't hesitate to contact me if I can be of assistance to you.   Following are the goals we discussed today:   Goals Addressed               This Visit's Progress     Provide Counseling & Supportive Services. (pt-stated)   On track     Care Coordination Interventions:  Interventions Today    Flowsheet Row Most Recent Value  Chronic Disease   Chronic disease during today's visit Atrial Fibrillation (AFib), Hypertension (HTN), Congestive Heart Failure (CHF), Chronic Kidney Disease/End Stage Renal Disease (ESRD), Chronic Obstructive Pulmonary Disease (COPD), Other  [Feelings of Sadness & Frustration Regarding Loss of Independence.]  General Interventions   General Interventions Discussed/Reviewed General Interventions Discussed, General Interventions Reviewed, Annual Eye Exam, Durable Medical Equipment (DME), Vaccines, Health Screening, Community Resources, Level of Care, Communication with, Doctor Visits  [Primary Care Provider]  Vaccines COVID-19, Flu, Pneumonia, RSV, Shingles, Tetanus/Pertussis/Diphtheria  [Encouraged]  Doctor Visits Discussed/Reviewed Doctor Visits Discussed, Doctor Visits Reviewed, Annual Wellness Visits, PCP, Specialist  [Encouraged]  Health Screening Bone Density, Colonoscopy, Mammogram  [Encouraged]  Durable Medical Equipment (DME) BP Cuff  PCP/Specialist Visits Compliance with follow-up visit  [Encouraged]  Communication with PCP/Specialists, RN  Level of Care Adult Daycare, Applications, Assisted Living, Personal Care Services  Applications Medicaid, Personal Care Services  Exercise Interventions   Exercise Discussed/Reviewed Exercise Discussed, Exercise Reviewed, Physical Activity, Weight Managment, Assistive device use and maintanence  [Encouraged]  Physical Activity Discussed/Reviewed Physical Activity Discussed, Physical Activity Reviewed, Types of exercise, Home Exercise Program  (HEP)  [Encouraged]  Weight Management Weight maintenance  [Encouraged]  Education Interventions   Education Provided Provided Therapist, sports, Provided Education  Provided Verbal Education On Nutrition, Foot Care, Eye Care, Mental Health/Coping with Illness, Applications, Exercise, Medication, Insurance Plans, [employer], When to see the doctor  Applications Medicaid, Personal Care Services  Mental Health Interventions   Mental Health Discussed/Reviewed Mental Health Discussed, Mental Health Reviewed, Coping Strategies, Suicide, Crisis, Substance Abuse, Grief and Loss, Depression, Anxiety  Refer to Social Work for counseling regarding Anxiety/Coping, Depression, Grief and Loss  Nutrition Interventions   Nutrition Discussed/Reviewed Nutrition Discussed, Nutrition Reviewed, Portion sizes, Decreasing sugar intake, Decreasing salt, Decreasing fats, Increaing proteins, Fluid intake, Adding fruits and vegetables  [Encouraged]  Pharmacy Interventions   Pharmacy Dicussed/Reviewed Pharmacy Topics Discussed, Pharmacy Topics Reviewed, Medication Adherence, Affording Medications  Safety Interventions   Safety Discussed/Reviewed Safety Discussed, Safety Reviewed, Fall Risk, Home Safety  [Encouraged]  Home Engineer, water, Refer for community resources  Advanced Directive Interventions   Advanced Directives Discussed/Reviewed Advanced Directives Discussed, Advanced Directives Reviewed     Active Listening & Reflection Utilized.  Verbalization of Feelings Encouraged.  Emotional Support Offered. Feelings of Grief Validated. Symptoms of Grief Acknowledged. Grief & Loss Support Groups Reviewed. Self-Enrollment in Grief & Loss Support Group Emphasized. Problem Solving Interventions Implemented. Task-Centered Solutions Activated.   Solution-Focused Strategies Employed. Acceptance & Commitment Therapy Indicated. Cognitive Behavioral Therapy Initiated. Client-Centered Therapy  Performed. Deep Breathing Exercises, Relaxation Techniques & Mindfulness Meditation Strategies Encouraged Daily. Please Continue Contacting [employer], Wellsite geologist of Interest, in An Effort to Obtain Care & Supervision in The Home: ~ Adult Day Care Programs  ~ In-Home Care & Respite Agencies ~ Home Health Care Agencies ~ Respite Care Agencies & Facilities ~ Aging, Disability & Transit Services of [facility] Please Contact CSW Directly [phone]), if You  Have Questions, Need Assistance, or If Additional Social Work Needs Are Identified Between Now & Our Next Scheduled Telephone [employer].      Our next appointment is by telephone on 01/10/2023 at 10:30 am.  Please call the care guide team at [phone] if you need to cancel or reschedule your appointment.   If you are experiencing a Mental Health or Behavioral Health Crisis or need someone to talk to, please call the Suicide and Crisis Lifeline: 988 call the [place] National Suicide Prevention Lifeline: (360)388-4074 or TTY: 817-684-0814 TTY 303-773-4768) to talk to a trained counselor call 1-800-273-TALK (toll free, 24 hour hotline) go to [facility] [address] [place] [phone]) call the [facility] Crisis Line: 276-131-7188 call 911  Patient verbalizes understanding of instructions and care plan provided today and agrees to view in MyChart. Active MyChart status and patient understanding of how to access instructions and care plan via MyChart confirmed with patient.     Telephone follow up appointment with care management team member scheduled for:  01/10/2023 at 10:30 am.  Danford Bad, BSW, MSW, LCSW  Licensed Clinical Social Worker  [facility]  Mailing [place]. [address] [place], [place] [address] Physical Address-[address] [place], [place] [address] Toll Free Main #  [phone] Fax # [phone] Cell # [phone] [email or web address]

## 2022-12-28 ENCOUNTER — Telehealth (HOSPITAL_COMMUNITY): Payer: Self-pay

## 2022-12-28 ENCOUNTER — Telehealth: Payer: Self-pay

## 2022-12-28 ENCOUNTER — Other Ambulatory Visit: Payer: Self-pay | Admitting: Family Medicine

## 2022-12-28 ENCOUNTER — Ambulatory Visit (INDEPENDENT_AMBULATORY_CARE_PROVIDER_SITE_OTHER): Payer: Medicare Other | Admitting: Family Medicine

## 2022-12-28 VITALS — BP 188/64 | HR 80 | Temp 98.1°F | Resp 16 | Ht 65.0 in

## 2022-12-28 DIAGNOSIS — M5441 Lumbago with sciatica, right side: Secondary | ICD-10-CM | POA: Diagnosis not present

## 2022-12-28 DIAGNOSIS — M5431 Sciatica, right side: Secondary | ICD-10-CM

## 2022-12-28 DIAGNOSIS — S32030D Wedge compression fracture of third lumbar vertebra, subsequent encounter for fracture with routine healing: Secondary | ICD-10-CM

## 2022-12-28 DIAGNOSIS — S32030A Wedge compression fracture of third lumbar vertebra, initial encounter for closed fracture: Secondary | ICD-10-CM

## 2022-12-28 DIAGNOSIS — M5442 Lumbago with sciatica, left side: Secondary | ICD-10-CM | POA: Diagnosis not present

## 2022-12-28 MED ORDER — HYDROMORPHONE HCL 4 MG PO TABS: 4.0000 mg | ORAL_TABLET | ORAL | 0 refills | Status: DC | PRN

## 2022-12-28 MED ORDER — OXYCODONE-ACETAMINOPHEN 10-325 MG PO TABS: 1.0000 | ORAL_TABLET | ORAL | 0 refills | Status: DC | PRN

## 2022-12-28 MED ORDER — PREDNISONE 20 MG PO TABS: ORAL_TABLET | ORAL | 0 refills | Status: DC

## 2022-12-28 NOTE — Telephone Encounter (Signed)
Patient called and states her BP has been elevated today. It was 188/69 at her doctor visit and she took while I was on the phone and it was 166/97. When she called she said it was over 220 systolic. She took a 2nd time while on the phone and it was 209/82. Please advise if any medication changes.

## 2022-12-28 NOTE — Telephone Encounter (Signed)
I spoke with [place] and updated her on medications. She verbalized understanding of medications and instructions on ED if BP is elevated. We set up follow up appointment for her as well.

## 2022-12-28 NOTE — Progress Notes (Signed)
Subjective:    Patient ID: [name], female    DOB: [birth year], 88 y.o.   MRN: [id number] Patient has a history of an L3 compression fracture.  She recently went kyphoplasty.  She is taking oxycodone 5 mg every 4 hours but reports severe unrelenting pain in her back.  The pain radiates into the posterior hips bilaterally, into her anterior thighs down to her knees.  She denies any weakness in the legs.  She is able to move her feet and her ankles.  She is able to bend her knees and flex her hips.  But she does report some numbness and some burning pain in both thighs.  She denies any fevers or chills.  She denies any dysuria urgency or frequency. Recent MRI revealed: IMPRESSION: 1. Subacute inferior endplate compression fractures at L3 and L4 with mild associated marrow edema. No osseous retropulsion. 2. Stable chronic inferior endplate compression deformity at L1. 3. Multilevel spondylosis with resulting mild multifactorial spinal stenosis at L3-4 and moderate spinal stenosis at L4-5. No high-grade foraminal narrowing or definite nerve root encroachment.     Past Medical History:  Diagnosis Date   Allergy    rhinitis   Aortic stenosis    mild by echo 06/2017   Arthritis    Bradycardia    a. 10/2017 -> beta blocker cut back due to HR 39.   Breast cancer 01/06/2012   Cancer    right colon and left breast   Chronic diastolic CHF (congestive heart failure)    Colon cancer 01/06/2012   Colovesical fistula    Dr. Carolynne Edouard and Dr. Laverle Patter planning surgery 08/2018- surgery revealed spontaneous closure   COPD (chronic obstructive pulmonary disease)    pt. denies   Coronary artery disease 2006   a.  NSTEMI in 2016, cath showed 15% prox-mid RCA, 20% prox LAD, EF 25-35% by cath and 35-40% -> felt due to Takotsubo cardiomyopathy.   Dilated aortic root    by echo 06/2017   Diverticulosis    Dyspnea    Edema extremities    GERD (gastroesophageal reflux disease)    Hernia    Hiatal hernia     denies   Hyperlipidemia    Hypertension    Mild aortic stenosis    echo 11/2015 but not noted on echo 06/2016   Osteopenia    Permanent atrial fibrillation    chronic atrial fibrillation   Pneumonia    hx child   Pulmonary HTN    a. moderate to severe PASP echo 11/2015 - now by echo 06/2017. CTA chest in 11/16 with no PE. PFTs in 7/15 with mild obstructive lung disease. She had a negative sleep study in 2017. b. Felt due to left sided HF.   Stroke    Takotsubo syndrome 07/29/2015   a. EF 35-40% by echo; akinesis of mid-apical anteroseptal and apical myocardium.  EF now normalized on echo 11/2015   Past Surgical History:  Procedure Laterality Date   APPENDECTOMY     BREAST SURGERY     lumpectomy left   CARDIAC CATHETERIZATION     CARDIAC CATHETERIZATION N/A 07/28/2015   Procedure: Left Heart Cath and Coronary Angiography;  Surgeon: Peter M Swaziland, MD;  Location: [facility] INVASIVE CV LAB;  Service: Cardiovascular;  Laterality: N/A;   CHOLECYSTECTOMY     COLECTOMY     right side   CYSTOSCOPY WITH STENT PLACEMENT Left 09/04/2018   Procedure: CYSTOSCOPY WITH LEFT STENT PLACEMENT, BLADDER REPAIR, CYSTOSCOPY  WITH LEFT STENT REMOVAL;  Surgeon: Heloise Purpura, MD;  Location: [facility] ORS;  Service: Urology;  Laterality: Left;   EXCISION OF ACCESSORY NIPPLE Bilateral 05/30/2013   Procedure: BILATERAL NIPPLE BIOPSY;  Surgeon: Robyne Askew, MD;  Location: [facility] OR;  Service: General;  Laterality: Bilateral;   EYE SURGERY Bilateral 12   cataracts   HYSTEROSCOPY WITH D & C N/A 05/29/2020   Procedure: DILATATION AND CURETTAGE /HYSTEROSCOPY;  Surgeon: Natale Milch, MD;  Location: [facility] ORS;  Service: Gynecology;  Laterality: N/A;   IR KYPHO EA ADDL LEVEL THORACIC OR LUMBAR  11/22/2022   IR KYPHO LUMBAR INC FX REDUCE BONE BX UNI/BIL CANNULATION INC/IMAGING  11/22/2022   IR RADIOLOGIST EVAL & MGMT  07/26/2017   LAPAROSCOPIC RIGHT COLECTOMY N/A 09/04/2018   Procedure: LAPAROSCOPIC ASSISTED  SIGMOID COLECTOMY WITH REPAIR OF FISTULA TO BLADDER;  Surgeon: Griselda Miner, MD;  Location: [facility] ORS;  Service: General;  Laterality: N/A;   RIGHT/LEFT HEART CATH AND CORONARY ANGIOGRAPHY N/A 12/10/2020   Procedure: RIGHT/LEFT HEART CATH AND CORONARY ANGIOGRAPHY;  Surgeon: Laurey Morale, MD;  Location: [facility] INVASIVE CV LAB;  Service: Cardiovascular;  Laterality: N/A;   SPLIT NIGHT STUDY  02/02/2016       Current Outpatient Medications on File Prior to Visit  Medication Sig Dispense Refill   albuterol (VENTOLIN HFA) 108 (90 Base) MCG/ACT inhaler Inhale 2 puffs into the lungs every 6 (six) hours as needed for wheezing or shortness of breath. 8 g 0   apixaban (ELIQUIS) 5 MG TABS tablet Take 1 tablet (5 mg total) by mouth 2 (two) times daily. 60 tablet    bisacodyl (DULCOLAX) 10 MG suppository Place 1 suppository (10 mg total) rectally as needed for moderate constipation. 12 suppository 0   budesonide-formoterol (SYMBICORT) 80-4.5 MCG/ACT inhaler Inhale 2 puffs into the lungs in the morning and at bedtime. (Patient taking differently: Inhale 2 puffs into the lungs See admin instructions. 2 puffs every morning, Frier take an additional 2 puffs later in the day if needed for wheezing, shortness of breath.) 1 each 12   clonazePAM (KLONOPIN) 0.5 MG tablet Take 1 tablet (0.5 mg total) by mouth 2 (two) times daily as needed. for anxiety 30 tablet 0   cloNIDine (CATAPRES) 0.1 MG tablet Take 1 tablet by mouth twice daily 180 tablet 0   famotidine (PEPCID) 20 MG tablet Take 20 mg by mouth at bedtime as needed for heartburn.     ferrous sulfate 325 (65 FE) MG tablet Take 1 tablet (325 mg total) by mouth daily with breakfast. 90 tablet 3   fluticasone (FLONASE) 50 MCG/ACT nasal spray Place 1 spray into both nostrils daily as needed for allergies or rhinitis.     nitroGLYCERIN (NITROSTAT) 0.4 MG SL tablet DISSOLVE ONE TABLET UNDER THE TONGUE EVERY 5 MINUTES AS NEEDED FOR CHEST PAIN.  DO NOT EXCEED A TOTAL OF 3 DOSES IN  15 MINUTES (Patient taking differently: Place 0.4 mg under the tongue every 5 (five) minutes x 3 doses as needed for chest pain.) 25 tablet 8   ondansetron (ZOFRAN-ODT) 4 MG disintegrating tablet Take 1 tablet (4 mg total) by mouth every 8 (eight) hours as needed for nausea or vomiting. 20 tablet 0   oxyCODONE (OXY IR/ROXICODONE) 5 MG immediate release tablet Take 1 tablet (5 mg total) by mouth every 6 (six) hours as needed. 30 tablet 0   oxyCODONE-acetaminophen (PERCOCET/ROXICET) 5-325 MG tablet Take 1 tablet by mouth every 4 (four) hours as needed  for severe pain. 30 tablet 0   polyethylene glycol powder (MIRALAX) 17 GM/SCOOP powder Take 17 g by mouth 2 (two) times daily as needed for mild constipation. 255 g 2   predniSONE (DELTASONE) 20 MG tablet 3 tabs poqday 1-2, 2 tabs poqday 3-4, 1 tab poqday 5-6 12 tablet 0   ranolazine (RANEXA) 500 MG 12 hr tablet Take 500 mg by mouth daily as needed (chest pain).     senna-docusate (SENOKOT-S) 8.6-50 MG tablet Take 2 tablets by mouth 2 (two) times daily between meals as needed for moderate constipation. 180 tablet 0   torsemide (DEMADEX) 20 MG tablet Take 40 mg by mouth 2 (two) times daily.     No current facility-administered medications on file prior to visit.   Allergies  Allergen Reactions   Contrast Media [Iodinated Contrast Media] Hives and Other (See Comments)    Chest pain and burning    Clonidine Derivatives Other (See Comments)    Dry mouth, throat   Statins Other (See Comments)    Myalgias   Sulfa Antibiotics Diarrhea    Tremors    Celebrex [Celecoxib] Rash   Isordil [Isosorbide Nitrate] Itching and Rash   Tape Itching and Rash    Redness at application site Reaction is to paper and plastic tapes and EKG leads   Social History   Socioeconomic History   Marital status: Widowed    Spouse name: Not on file   Number of children: 1   Years of education: 62   Highest education level: 12th grade  Occupational History   Not on file   Tobacco Use   Smoking status: Never    Passive exposure: Never   Smokeless tobacco: Never  Vaping Use   Vaping Use: Never used  Substance and Sexual Activity   Alcohol use: No   Drug use: No   Sexual activity: Not Currently    Partners: Male  Other Topics Concern   Not on file  Social History Narrative   Husband [name], passed away in 02-23-22. Pt's son, Loletta Specter lives beside of patient and helps with her care.    Social Determinants of Health   Financial Resource Strain: Low Risk  (11/08/2022)   Overall Financial Resource Strain (CARDIA)    Difficulty of Paying Living Expenses: Not very hard  Recent Concern: Financial Resource Strain - Medium Risk (11/04/2022)   Overall Financial Resource Strain (CARDIA)    Difficulty of Paying Living Expenses: Somewhat hard  Food Insecurity: No Food Insecurity (11/08/2022)   Hunger Vital Sign    Worried About Running Out of Food in the Last Year: Never true    Ran Out of Food in the Last Year: Never true  Transportation Needs: No Transportation Needs (11/08/2022)   PRAPARE - Administrator, Civil Service (Medical): No    Lack of Transportation (Non-Medical): No  Physical Activity: Sufficiently Active (11/08/2022)   Exercise Vital Sign    Days of Exercise per Week: 5 days    Minutes of Exercise per Session: 30 min  Recent Concern: Physical Activity - Insufficiently Active (08/11/2022)   Exercise Vital Sign    Days of Exercise per Week: 5 days    Minutes of Exercise per Session: 10 min  Stress: No Stress Concern Present (11/08/2022)   [employer] of Occupational Health - Occupational Stress Questionnaire    Feeling of Stress : Only a little  Social Connections: Moderately Integrated (11/08/2022)   Social Connection and Isolation Panel [NHANES]  Frequency of Communication with Friends and Family: More than three times a week    Frequency of Social Gatherings with Friends and Family: More than three times a week    Attends  Religious Services: More than 4 times per year    Active Member of [employer] or Organizations: Yes    Attends Banker Meetings: More than 4 times per year    Marital Status: Widowed  Recent Concern: Social Connections - Moderately Isolated (08/11/2022)   Social Connection and Isolation Panel [NHANES]    Frequency of Communication with Friends and Family: More than three times a week    Frequency of Social Gatherings with Friends and Family: Twice a week    Attends Religious Services: More than 4 times per year    Active Member of [employer] or Organizations: No    Attends Banker Meetings: Never    Marital Status: Widowed  Intimate Partner Violence: Not At Risk (11/08/2022)   Humiliation, Afraid, Rape, and Kick questionnaire    Fear of Current or Ex-Partner: No    Emotionally Abused: No    Physically Abused: No    Sexually Abused: No      Review of Systems  Musculoskeletal:  Positive for back pain.  All other systems reviewed and are negative.      Objective:   Physical Exam Vitals reviewed.  Constitutional:      General: She is not in acute distress.    Appearance: Normal appearance. She is well-developed and normal weight. She is not diaphoretic.  HENT:     Nose: No congestion or rhinorrhea.     Mouth/Throat:     Mouth: Mucous membranes are moist.     Pharynx: No oropharyngeal exudate or posterior oropharyngeal erythema.  Eyes:     General:        Right eye: No discharge.        Left eye: No discharge.     Conjunctiva/sclera: Conjunctivae normal.  Neck:     Vascular: No carotid bruit.  Cardiovascular:     Rate and Rhythm: Normal rate and regular rhythm.     Heart sounds: Murmur heard.     No friction rub. No gallop.  Pulmonary:     Effort: Pulmonary effort is normal. No accessory muscle usage, prolonged expiration or respiratory distress.     Breath sounds: Normal breath sounds and air entry. No stridor, decreased air movement or transmitted upper  airway sounds. No decreased breath sounds, wheezing, rhonchi or rales.  Chest:     Chest wall: No mass, lacerations, deformity, swelling or crepitus.  Abdominal:     General: Abdomen is flat. Bowel sounds are decreased. There is no distension.     Palpations: Abdomen is not rigid.     Tenderness: There is no abdominal tenderness. There is no guarding or rebound.     Hernia: No hernia is present.  Musculoskeletal:     Cervical back: No rigidity or tenderness.     Lumbar back: Tenderness and bony tenderness present. No swelling, edema, deformity, signs of trauma or spasms. Decreased range of motion.       Back:     Right hip: Tenderness and bony tenderness present. Decreased range of motion.     Left hip: Tenderness and bony tenderness present. Decreased range of motion.     Right lower leg: No edema.     Left lower leg: No edema.       Legs:  Skin:  Findings: No bruising or lesion.  Neurological:     Mental Status: She is alert. Mental status is at baseline.     Motor: No abnormal muscle tone.     Coordination: Coordination normal.     Gait: Gait normal.     Deep Tendon Reflexes: Reflexes normal.  Psychiatric:        Mood and Affect: Mood normal.        Behavior: Behavior normal.        Thought Content: Thought content normal.        Judgment: Judgment normal.          Assessment & Plan:  Acute midline low back pain with bilateral sciatica  Closed compression fracture of L3 lumbar vertebra, initial encounter  Bilateral sciatica Patient is having bilateral sciatica-like symptoms after having an L3 kyphoplasty.  Therefore him and arrange an MRI of the lower back to see if there is any evidence of nerve.  At the present time there is no evidence of cauda equina syndrome.  There is no evidence of discitis.  She denies any fevers or chills.  Pending the results of the MRI we will treat the patient symptomatically with Dilaudid 4 mg every 6 hours as needed for pain and start  prednisone taper pack due to the lumbar radiculopathy.  If she develops symptoms of paralysis she is to go to the emergency room immediately.  If she develops fevers or chills she is to go to the emergency room immediately.

## 2022-12-28 NOTE — Telephone Encounter (Signed)
Pt's son, [name], called back and states he and Mrs. Haws are concerned about her taking the Dilaudid. [name] asks if there is a quick release Oxycodone that she can take instead? Thank you.

## 2022-12-29 ENCOUNTER — Telehealth (HOSPITAL_COMMUNITY): Payer: Self-pay

## 2022-12-29 ENCOUNTER — Ambulatory Visit: Payer: Medicare Other | Admitting: Podiatry

## 2022-12-29 DIAGNOSIS — M48061 Spinal stenosis, lumbar region without neurogenic claudication: Secondary | ICD-10-CM | POA: Diagnosis not present

## 2022-12-29 DIAGNOSIS — I4819 Other persistent atrial fibrillation: Secondary | ICD-10-CM | POA: Diagnosis not present

## 2022-12-29 DIAGNOSIS — M47816 Spondylosis without myelopathy or radiculopathy, lumbar region: Secondary | ICD-10-CM | POA: Diagnosis not present

## 2022-12-29 DIAGNOSIS — I350 Nonrheumatic aortic (valve) stenosis: Secondary | ICD-10-CM | POA: Diagnosis not present

## 2022-12-29 DIAGNOSIS — I5181 Takotsubo syndrome: Secondary | ICD-10-CM | POA: Diagnosis not present

## 2022-12-29 DIAGNOSIS — S32030D Wedge compression fracture of third lumbar vertebra, subsequent encounter for fracture with routine healing: Secondary | ICD-10-CM | POA: Diagnosis not present

## 2022-12-29 DIAGNOSIS — J449 Chronic obstructive pulmonary disease, unspecified: Secondary | ICD-10-CM | POA: Diagnosis not present

## 2022-12-29 DIAGNOSIS — I130 Hypertensive heart and chronic kidney disease with heart failure and stage 1 through stage 4 chronic kidney disease, or unspecified chronic kidney disease: Secondary | ICD-10-CM | POA: Diagnosis not present

## 2022-12-29 DIAGNOSIS — U071 COVID-19: Secondary | ICD-10-CM | POA: Diagnosis not present

## 2022-12-29 DIAGNOSIS — M1990 Unspecified osteoarthritis, unspecified site: Secondary | ICD-10-CM | POA: Diagnosis not present

## 2022-12-29 DIAGNOSIS — F419 Anxiety disorder, unspecified: Secondary | ICD-10-CM | POA: Diagnosis not present

## 2022-12-29 DIAGNOSIS — J309 Allergic rhinitis, unspecified: Secondary | ICD-10-CM | POA: Diagnosis not present

## 2022-12-29 DIAGNOSIS — N1832 Chronic kidney disease, stage 3b: Secondary | ICD-10-CM | POA: Diagnosis not present

## 2022-12-29 DIAGNOSIS — D509 Iron deficiency anemia, unspecified: Secondary | ICD-10-CM | POA: Diagnosis not present

## 2022-12-29 DIAGNOSIS — I5030 Unspecified diastolic (congestive) heart failure: Secondary | ICD-10-CM | POA: Diagnosis not present

## 2022-12-29 DIAGNOSIS — I2510 Atherosclerotic heart disease of native coronary artery without angina pectoris: Secondary | ICD-10-CM | POA: Diagnosis not present

## 2022-12-29 NOTE — Telephone Encounter (Signed)
FYI - HH called to update on BP - It was 180/70 before taking her meds. She has not been taking Clonidine as instructed. They reinforced how she is supposed to be taking it and also reinforced watching the readings and when to go to ED. Her machine was checked against manual BP reading and was significantly higher than manual reading. They suggested she get a new BP machine as well.

## 2022-12-30 DIAGNOSIS — I130 Hypertensive heart and chronic kidney disease with heart failure and stage 1 through stage 4 chronic kidney disease, or unspecified chronic kidney disease: Secondary | ICD-10-CM | POA: Diagnosis not present

## 2022-12-30 DIAGNOSIS — F419 Anxiety disorder, unspecified: Secondary | ICD-10-CM | POA: Diagnosis not present

## 2022-12-30 DIAGNOSIS — D509 Iron deficiency anemia, unspecified: Secondary | ICD-10-CM | POA: Diagnosis not present

## 2022-12-30 DIAGNOSIS — S32030D Wedge compression fracture of third lumbar vertebra, subsequent encounter for fracture with routine healing: Secondary | ICD-10-CM | POA: Diagnosis not present

## 2022-12-30 DIAGNOSIS — I2510 Atherosclerotic heart disease of native coronary artery without angina pectoris: Secondary | ICD-10-CM | POA: Diagnosis not present

## 2022-12-30 DIAGNOSIS — M1990 Unspecified osteoarthritis, unspecified site: Secondary | ICD-10-CM | POA: Diagnosis not present

## 2022-12-30 DIAGNOSIS — J449 Chronic obstructive pulmonary disease, unspecified: Secondary | ICD-10-CM | POA: Diagnosis not present

## 2022-12-30 DIAGNOSIS — U071 COVID-19: Secondary | ICD-10-CM | POA: Diagnosis not present

## 2022-12-30 DIAGNOSIS — I4819 Other persistent atrial fibrillation: Secondary | ICD-10-CM | POA: Diagnosis not present

## 2022-12-30 DIAGNOSIS — I350 Nonrheumatic aortic (valve) stenosis: Secondary | ICD-10-CM | POA: Diagnosis not present

## 2022-12-30 DIAGNOSIS — M48061 Spinal stenosis, lumbar region without neurogenic claudication: Secondary | ICD-10-CM | POA: Diagnosis not present

## 2022-12-30 DIAGNOSIS — N1832 Chronic kidney disease, stage 3b: Secondary | ICD-10-CM | POA: Diagnosis not present

## 2022-12-30 DIAGNOSIS — M47816 Spondylosis without myelopathy or radiculopathy, lumbar region: Secondary | ICD-10-CM | POA: Diagnosis not present

## 2022-12-30 DIAGNOSIS — J309 Allergic rhinitis, unspecified: Secondary | ICD-10-CM | POA: Diagnosis not present

## 2022-12-30 DIAGNOSIS — I5181 Takotsubo syndrome: Secondary | ICD-10-CM | POA: Diagnosis not present

## 2022-12-30 DIAGNOSIS — I5030 Unspecified diastolic (congestive) heart failure: Secondary | ICD-10-CM | POA: Diagnosis not present

## 2022-12-31 DIAGNOSIS — I5181 Takotsubo syndrome: Secondary | ICD-10-CM | POA: Diagnosis not present

## 2022-12-31 DIAGNOSIS — J449 Chronic obstructive pulmonary disease, unspecified: Secondary | ICD-10-CM | POA: Diagnosis not present

## 2022-12-31 DIAGNOSIS — D509 Iron deficiency anemia, unspecified: Secondary | ICD-10-CM | POA: Diagnosis not present

## 2022-12-31 DIAGNOSIS — M1990 Unspecified osteoarthritis, unspecified site: Secondary | ICD-10-CM | POA: Diagnosis not present

## 2022-12-31 DIAGNOSIS — I2510 Atherosclerotic heart disease of native coronary artery without angina pectoris: Secondary | ICD-10-CM | POA: Diagnosis not present

## 2022-12-31 DIAGNOSIS — I130 Hypertensive heart and chronic kidney disease with heart failure and stage 1 through stage 4 chronic kidney disease, or unspecified chronic kidney disease: Secondary | ICD-10-CM | POA: Diagnosis not present

## 2022-12-31 DIAGNOSIS — N1832 Chronic kidney disease, stage 3b: Secondary | ICD-10-CM | POA: Diagnosis not present

## 2022-12-31 DIAGNOSIS — I5030 Unspecified diastolic (congestive) heart failure: Secondary | ICD-10-CM | POA: Diagnosis not present

## 2022-12-31 DIAGNOSIS — M47816 Spondylosis without myelopathy or radiculopathy, lumbar region: Secondary | ICD-10-CM | POA: Diagnosis not present

## 2022-12-31 DIAGNOSIS — J309 Allergic rhinitis, unspecified: Secondary | ICD-10-CM | POA: Diagnosis not present

## 2022-12-31 DIAGNOSIS — S32030D Wedge compression fracture of third lumbar vertebra, subsequent encounter for fracture with routine healing: Secondary | ICD-10-CM | POA: Diagnosis not present

## 2022-12-31 DIAGNOSIS — U071 COVID-19: Secondary | ICD-10-CM | POA: Diagnosis not present

## 2022-12-31 DIAGNOSIS — I350 Nonrheumatic aortic (valve) stenosis: Secondary | ICD-10-CM | POA: Diagnosis not present

## 2022-12-31 DIAGNOSIS — M48061 Spinal stenosis, lumbar region without neurogenic claudication: Secondary | ICD-10-CM | POA: Diagnosis not present

## 2022-12-31 DIAGNOSIS — I4819 Other persistent atrial fibrillation: Secondary | ICD-10-CM | POA: Diagnosis not present

## 2022-12-31 DIAGNOSIS — F419 Anxiety disorder, unspecified: Secondary | ICD-10-CM | POA: Diagnosis not present

## 2023-01-03 DIAGNOSIS — S32030D Wedge compression fracture of third lumbar vertebra, subsequent encounter for fracture with routine healing: Secondary | ICD-10-CM | POA: Diagnosis not present

## 2023-01-03 DIAGNOSIS — R262 Difficulty in walking, not elsewhere classified: Secondary | ICD-10-CM | POA: Diagnosis not present

## 2023-01-03 DIAGNOSIS — M6281 Muscle weakness (generalized): Secondary | ICD-10-CM | POA: Diagnosis not present

## 2023-01-03 DIAGNOSIS — Z9181 History of falling: Secondary | ICD-10-CM | POA: Diagnosis not present

## 2023-01-04 ENCOUNTER — Other Ambulatory Visit: Payer: Self-pay | Admitting: Family Medicine

## 2023-01-04 ENCOUNTER — Encounter (HOSPITAL_COMMUNITY): Payer: Self-pay

## 2023-01-04 ENCOUNTER — Ambulatory Visit (HOSPITAL_COMMUNITY)
Admission: RE | Admit: 2023-01-04 | Discharge: 2023-01-04 | Disposition: A | Discharge status: Home / Self Care | Payer: Medicare Other | Source: Ambulatory Visit | Attending: Family Medicine | Admitting: Family Medicine

## 2023-01-04 VITALS — BP 166/62 | HR 56 | Wt 167.4 lb

## 2023-01-04 DIAGNOSIS — Z7901 Long term (current) use of anticoagulants: Secondary | ICD-10-CM | POA: Insufficient documentation

## 2023-01-04 DIAGNOSIS — I272 Pulmonary hypertension, unspecified: Secondary | ICD-10-CM | POA: Insufficient documentation

## 2023-01-04 DIAGNOSIS — I5032 Chronic diastolic (congestive) heart failure: Secondary | ICD-10-CM | POA: Diagnosis not present

## 2023-01-04 DIAGNOSIS — I350 Nonrheumatic aortic (valve) stenosis: Secondary | ICD-10-CM

## 2023-01-04 DIAGNOSIS — I110 Hypertensive heart disease with heart failure: Secondary | ICD-10-CM | POA: Insufficient documentation

## 2023-01-04 DIAGNOSIS — Z8249 Family history of ischemic heart disease and other diseases of the circulatory system: Secondary | ICD-10-CM | POA: Insufficient documentation

## 2023-01-04 DIAGNOSIS — Z5986 Financial insecurity: Secondary | ICD-10-CM | POA: Insufficient documentation

## 2023-01-04 DIAGNOSIS — E875 Hyperkalemia: Secondary | ICD-10-CM

## 2023-01-04 DIAGNOSIS — K5792 Diverticulitis of intestine, part unspecified, without perforation or abscess without bleeding: Secondary | ICD-10-CM

## 2023-01-04 DIAGNOSIS — F064 Anxiety disorder due to known physiological condition: Secondary | ICD-10-CM

## 2023-01-04 DIAGNOSIS — I10 Essential (primary) hypertension: Secondary | ICD-10-CM | POA: Diagnosis not present

## 2023-01-04 DIAGNOSIS — Z79899 Other long term (current) drug therapy: Secondary | ICD-10-CM | POA: Insufficient documentation

## 2023-01-04 DIAGNOSIS — I2510 Atherosclerotic heart disease of native coronary artery without angina pectoris: Secondary | ICD-10-CM | POA: Diagnosis not present

## 2023-01-04 DIAGNOSIS — R001 Bradycardia, unspecified: Secondary | ICD-10-CM

## 2023-01-04 DIAGNOSIS — I482 Chronic atrial fibrillation, unspecified: Secondary | ICD-10-CM | POA: Diagnosis not present

## 2023-01-04 DIAGNOSIS — R5381 Other malaise: Secondary | ICD-10-CM

## 2023-01-04 LAB — BASIC METABOLIC PANEL
Anion gap: 11 (ref 5–15)
BUN: 47 mg/dL — ABNORMAL HIGH (ref 8–23)
CO2: 27 mmol/L (ref 22–32)
Calcium: 9.4 mg/dL (ref 8.9–10.3)
Chloride: 87 mmol/L — ABNORMAL LOW (ref 98–111)
Creatinine, Ser: 1.27 mg/dL — ABNORMAL HIGH (ref 0.44–1.00)
GFR, Estimated: 41 mL/min — ABNORMAL LOW (ref >60)
Glucose, Bld: 145 mg/dL — ABNORMAL HIGH (ref 70–99)
Potassium: 5.5 mmol/L — ABNORMAL HIGH (ref 3.5–5.1)
Sodium: 125 mmol/L — ABNORMAL LOW (ref 135–145)

## 2023-01-04 MED ORDER — CLONIDINE HCL 0.2 MG PO TABS: 0.2000 mg | ORAL_TABLET | Freq: Two times a day (BID) | ORAL | 6 refills | Status: DC

## 2023-01-04 NOTE — Patient Instructions (Signed)
Medication Changes:  None, continue current medications, update prescription for Clonidine has been sent in for you  Lab Work:  Labs done today, your results will be available in MyChart, we will contact you for abnormal readings.   Special Instructions // Education:  Do the following things EVERYDAY: Weigh yourself in the morning before breakfast. Write it down and keep it in a log. Take your medicines as prescribed Eat low salt foods--Limit salt (sodium) to 2000 mg per day.  Stay as active as you can everyday Limit all fluids for the day to less than 2 liters   Follow-Up in: 3 months with Dr Shirlee Latch  At the [facility], you and your health needs are our priority. We have a designated team specialized in the treatment of Heart Failure. This Care Team includes your primary Heart Failure Specialized Cardiologist (physician), Advanced Practice Providers (APPs- Physician Assistants and Nurse Practitioners), and Pharmacist who all work together to provide you with the care you need, when you need it.   You Boney see any of the following providers on your designated Care Team at your next follow up:  Dr. Arvilla Meres Dr. Marca Ancona Dr. Marcos Eke, NP Robbie Lis, [place] [facility] [place], [place] Brynda Peon, NP Karle Plumber, PharmD   Please be sure to bring in all your medications bottles to every appointment.   Need to Contact us:  If you have any questions or concerns before your next appointment please send [place] a message through [place] or call our office at [phone].    TO LEAVE A MESSAGE FOR THE NURSE SELECT OPTION 2, PLEASE LEAVE A MESSAGE INCLUDING: YOUR NAME DATE OF BIRTH CALL BACK NUMBER REASON FOR CALL**this is important as we prioritize the call backs  YOU WILL RECEIVE A CALL BACK THE SAME DAY AS LONG AS YOU CALL BEFORE 4:00 PM

## 2023-01-04 NOTE — Progress Notes (Signed)
ReDS Vest / Clip - 01/04/23 1300       ReDS Vest / Clip   Station Marker A    Ruler Value 29.5    ReDS Value Range Low volume    ReDS Actual Value 35

## 2023-01-04 NOTE — Progress Notes (Signed)
Patient ID: [name], female   DOB: [birth year], 88 y.o.   MRN: [id number]    Advanced Heart Failure Clinic Note   PCP: Donita Brooks, MD Cardiology: Dr. Mayford Knife HF Cardiology: Dr. Shirlee Latch  88 y.o. with history of poorly controlled HTN, chronic atrial fibrillation, chronic diastolic CHF, and prior episode of Takotsubo cardiomyopathy. Patient has had exertional dyspnea since 11/16.  At that time, she presented to the hospital with chest pain.  Coronary angiography showed no significant CAD.  EF was 35-40% by echo, suspected Takotsubo cardiomyopathy.  Her echo in 3/17 showed EF improved to 60-65% but PA systolic pressure elevated at 65 mmHg.    RHC/LHC in 4/22 showed nonobstructive CAD; mean RA 5, PA 44/10, mean PCWP 20, CI 4.89, PVR < 1 WU.  Echo in 8/22 showed EF up to 65-70%, mild LVH, normal RV, PASP 67, moderate RAE, trivial MR. PYP scan in 9/22 was probably negative.   Patient was admitted in 7/23 with chest pain, she had a mild elevation in troponin with no trend.  This was thought to be due to demand ischemia with volume overload rather than ACS.  Echo this admission showed EF 60-65%, mild LVH, moderate RV enlargement, normal RV systolic function, PASP 50 mmHg, mild mitral stenosis with mean gradient 4 mmHg, mild-moderate AS, IVC normal.   7 day Zio 11/23 showed atrial fibrillation, average HR 54 bpm with 2 3-second pauses.  Admitted 3/24 with spinal compression fx. Found to be hyponatremic, with sodium 121 on admission. Initially given IV lasix then SCr elevated and received IVF. IR consulted and she underwent L3 and L4 balloon kyphoplasty. She was discharged to SNF, weight 168 lbs.  Today she returns for AHF follow up with her son. Overall feeling ok, having issues with constipation. Denies palpitations, CP, edema, or PND/Orthopnea. Intt SOB with activity, limited activity d/t back pain. Appetite not too good, drinks 1 Ensure/day. No fever or chills. Working with PT once a week, difficulty  working with them 2/2 back pain. Weight at home 168 pounds. Taking all medications although she does occasionally forget a dose of her meds.  BP at home 140-150s. Recently called the clinic with elevated BPs (200s), HH RN manually checked and noticed large discrepancy between machine and manual BP, HTN meds adjusted. Intt positional dizziness, resolves with rest. SBP at 130s at PCP visit recently.   REDs: 35%  ECG (personally reviewed): Reviewed EKG from 12/14/22: a fib 57 bpm  Labs (11/23): K 4.1, creatinine 1.85, hgb 10.7 Labs (2/24): K 4.6, creatinine 1.59 => 1.07, BNP 136 Labs (3/24): K 4.7, creatinine 1.21 Labs (4/24): K 5.3, SCr 1.33  PMH: 1. Hyperlipidemia: Myalgias with atorvastatin, Crestor, simvastatin. Myalgias with Zetia.  2. Chronic atrial fibrillation 3. HTN: Poor control.  4. H/o breast cancer: 2013.  5. GERD 6. Takotsubo cardiomyopathy: Admitted in 11/16 with chest pain.  Coronary angiography showed nonobstructive CAD.  Echo (11/16) showed EF 35-40%.  EF back to normal by 3/17 echo.  7. Chronic diastolic CHF - Echo (3/17) with EF 60-65%, mild aortic stenosis, PA systolic pressure 65 mmHg, RV mildly dilated with normal systolic function.  - Cardiolite (4/17) with EF 61%, small reversible mid anteroseptal/apical lateral defects thought to be related to shifting breast artifact => low risk study.  - Echo (12/21): EF 65-70%, normal RV, PASP 60 mmHg, mild AS, mild-moderate TR.   - RHC/LHC in 4/22 showed nonobstructive CAD; mean RA 5, PA 44/10, mean PCWP 20, CI 4.89, PVR <  1 WU.   - Echo in 8/22 showed EF up to 65-70%, mild LVH, normal RV, PASP 67, moderate RAE, trivial MR.  - PYP scan (9/22): H/CL < 1.5, grade 1.  Probably negative.   - Echo (7/23): EF 60-65%, mild LVH, moderate RV enlargement, normal RV systolic function, PASP 50 mmHg, mild mitral stenosis with mean gradient 4 mmHg, mild-moderate AS, IVC normal.  8. Ascending aortic aneurysm: 4.3 cm by CTA in 11/16.  9. Pulmonary  hypertension: PASP 65 mmHg by echo in 3/17.  CTA chest in 11/16 with no PE.  PFTs in 7/15 with mild obstructive lung disease.  10. Sleep study negative 02/04/2016. 11. Bradycardia: off nodal blockers.  - Holter (8/20): Average HR 51, afib - 7 day Zio (11/23): AF average HR 54 bpm, 2 3-second pauses. 12. Aortic stenosis: Mild-moderate 7/23 echo. 13. Mitral stenosis: Mild 7/23 echo  14. Compression fracture: s/p L3 and L4 kyphoplasty (3/24)  Social History   Socioeconomic History   Marital status: Widowed    Spouse name: Not on file   Number of children: 1   Years of education: 77   Highest education level: 12th grade  Occupational History   Not on file  Tobacco Use   Smoking status: Never    Passive exposure: Never   Smokeless tobacco: Never  Vaping Use   Vaping Use: Never used  Substance and Sexual Activity   Alcohol use: No   Drug use: No   Sexual activity: Not Currently    Partners: Male  Other Topics Concern   Not on file  Social History Narrative   Husband [name], passed away in 02-03-2022. Pt's son, Loletta Specter lives beside of patient and helps with her care.    Social Determinants of Health   Financial Resource Strain: Low Risk  (11/08/2022)   Overall Financial Resource Strain (CARDIA)    Difficulty of Paying Living Expenses: Not very hard  Recent Concern: Financial Resource Strain - Medium Risk (11/04/2022)   Overall Financial Resource Strain (CARDIA)    Difficulty of Paying Living Expenses: Somewhat hard  Food Insecurity: No Food Insecurity (11/08/2022)   Hunger Vital Sign    Worried About Running Out of Food in the Last Year: Never true    Ran Out of Food in the Last Year: Never true  Transportation Needs: No Transportation Needs (11/08/2022)   PRAPARE - Administrator, Civil Service (Medical): No    Lack of Transportation (Non-Medical): No  Physical Activity: Sufficiently Active (11/08/2022)   Exercise Vital Sign    Days of Exercise per Week: 5 days     Minutes of Exercise per Session: 30 min  Recent Concern: Physical Activity - Insufficiently Active (08/11/2022)   Exercise Vital Sign    Days of Exercise per Week: 5 days    Minutes of Exercise per Session: 10 min  Stress: No Stress Concern Present (11/08/2022)   [employer] of Occupational Health - Occupational Stress Questionnaire    Feeling of Stress : Only a little  Social Connections: Moderately Integrated (11/08/2022)   Social Connection and Isolation Panel [NHANES]    Frequency of Communication with Friends and Family: More than three times a week    Frequency of Social Gatherings with Friends and Family: More than three times a week    Attends Religious Services: More than 4 times per year    Active Member of [employer] or Organizations: Yes    Attends Banker Meetings: More than 4  times per year    Marital Status: Widowed  Recent Concern: Social Connections - Moderately Isolated (08/11/2022)   Social Connection and Isolation Panel [NHANES]    Frequency of Communication with Friends and Family: More than three times a week    Frequency of Social Gatherings with Friends and Family: Twice a week    Attends Religious Services: More than 4 times per year    Active Member of [employer] or Organizations: No    Attends Banker Meetings: Never    Marital Status: Widowed  Intimate Partner Violence: Not At Risk (11/08/2022)   Humiliation, Afraid, Rape, and Kick questionnaire    Fear of Current or Ex-Partner: No    Emotionally Abused: No    Physically Abused: No    Sexually Abused: No   Family History  Problem Relation Age of Onset   Heart disease Mother    Heart attack Mother    Cancer Sister        stomach and colon   Heart disease Brother 36   Hypertension Father    Cancer Sister    Stroke Neg Hx    ROS: All systems reviewed and negative except as per HPI.   Current Outpatient Medications  Medication Sig Dispense Refill   apixaban (ELIQUIS) 5 MG TABS tablet  Take 1 tablet (5 mg total) by mouth 2 (two) times daily. 60 tablet    clonazePAM (KLONOPIN) 0.5 MG tablet Take 1 tablet (0.5 mg total) by mouth 2 (two) times daily as needed. for anxiety 30 tablet 0   cloNIDine (CATAPRES) 0.1 MG tablet Take 1 tablet by mouth twice daily (Patient taking differently: Patient takes 2 tablets by mouth twice a day.) 180 tablet 0   famotidine (PEPCID) 20 MG tablet Take 20 mg by mouth at bedtime as needed for heartburn.     ferrous sulfate 325 (65 FE) MG tablet Take 1 tablet (325 mg total) by mouth daily with breakfast. 90 tablet 3   fluticasone (FLONASE) 50 MCG/ACT nasal spray Place 1 spray into both nostrils daily as needed for allergies or rhinitis.     nitroGLYCERIN (NITROSTAT) 0.4 MG SL tablet DISSOLVE ONE TABLET UNDER THE TONGUE EVERY 5 MINUTES AS NEEDED FOR CHEST PAIN.  DO NOT EXCEED A TOTAL OF 3 DOSES IN 15 MINUTES 25 tablet 8   ondansetron (ZOFRAN-ODT) 4 MG disintegrating tablet Take 1 tablet (4 mg total) by mouth every 8 (eight) hours as needed for nausea or vomiting. 20 tablet 0   oxyCODONE-acetaminophen (PERCOCET) 10-325 MG tablet Take 1 tablet by mouth every 4 (four) hours as needed for pain (stop dilaudid). 60 tablet 0   polyethylene glycol powder (MIRALAX) 17 GM/SCOOP powder Take 17 g by mouth 2 (two) times daily as needed for mild constipation. 255 g 2   predniSONE (DELTASONE) 20 MG tablet 3 tabs poqday 1-2, 2 tabs poqday 3-4, 1 tab poqday 5-6 12 tablet 0   ranolazine (RANEXA) 500 MG 12 hr tablet Take 500 mg by mouth daily as needed (chest pain).     senna-docusate (SENOKOT-S) 8.6-50 MG tablet Take 2 tablets by mouth 2 (two) times daily between meals as needed for moderate constipation. 180 tablet 0   torsemide (DEMADEX) 20 MG tablet Take 40 mg by mouth 2 (two) times daily.     albuterol (VENTOLIN HFA) 108 (90 Base) MCG/ACT inhaler Inhale 2 puffs into the lungs every 6 (six) hours as needed for wheezing or shortness of breath. 8 g 0   bisacodyl (DULCOLAX)  10  MG suppository Place 1 suppository (10 mg total) rectally as needed for moderate constipation. 12 suppository 0   budesonide-formoterol (SYMBICORT) 80-4.5 MCG/ACT inhaler Inhale 2 puffs into the lungs in the morning and at bedtime. (Patient taking differently: Inhale 2 puffs into the lungs See admin instructions. 2 puffs every morning, Ortman take an additional 2 puffs later in the day if needed for wheezing, shortness of breath.) 1 each 12   HYDROmorphone (DILAUDID) 4 MG tablet Take 1 tablet (4 mg total) by mouth every 4 (four) hours as needed for severe pain. 60 tablet 0   No current facility-administered medications for this encounter.   BP (!) 166/62   Pulse (!) 56   Wt 75.9 kg (167 lb 6.4 oz)   SpO2 96%   BMI 27.86 kg/m    Wt Readings from Last 3 Encounters:  01/04/23 75.9 kg (167 lb 6.4 oz)  12/24/22 76 kg (167 lb 9.6 oz)  12/16/22 77.5 kg (170 lb 12.8 oz)   Physical Exam General:  elderly appearing.  No respiratory difficulty. Arrived via WC HEENT: normal Neck: supple. JVD ~8 cm. Carotids 2+ bilat; no bruits. No lymphadenopathy or thyromegaly appreciated. Cor: PMI nondisplaced. Brad rate & irregular rhythm. No rubs, gallops or murmurs. Lungs: clear Abdomen: soft, nontender, nondistended. No hepatosplenomegaly. No bruits or masses. Good bowel sounds. Extremities: no cyanosis, clubbing, rash, trace BLE edema  Neuro: alert & oriented x 3, cranial nerves grossly intact. moves all 4 extremities w/o difficulty. Affect pleasant.   Assessment/Plan: 1. HTN: BP elevated by improved, running around 140s at home. - Off spironolactone and losartan with hyperkalemia - Clonidine recently increased, continue  0.2 mg BID - As above, recommended she get a new BP cuff with cuff discrepancies  2. Chronic diastolic CHF: NYHA class III symptoms, slowly progressive over time.  Mild volume overload on exam.  RHC/LHC in 4/22 showed nonobstructive CAD; mean RA 5, PA 44/10, mean PCWP 20, CI 4.89, PVR < 1  WU (pulmonary venous hypertension).  Echo in 8/22 showed EF up to 65-70%, mild LVH, normal RV, PASP 67, moderate RAE, trivial MR. PYP scan in 9/22 was probably negative. Echo in 7/23 showed EF 60-65%, mild LVH, moderate RV enlargement, normal RV systolic function, PASP 50 mmHg, mild mitral stenosis with mean gradient 4 mmHg, mild-moderate AS, IVC normal. NYHA class III symptoms, she does not look volume overloaded on exam, weight stable and ReDs 35%.  - Off spiro and losartan with hyperkalemia - Continue torsemide 40 mg bid. - off BB with bradycardia - Unable to tolerate Jardiance due to frequent UTIs.  - she has declined Cardiomems 3.  CAD: Nonobstructive on 4/22 cath. She has been intolerant of statins and Zetia.   She has had atypical chest pain in the past. Admission 7/23 with CP, elevated HsT, CT ruled out acute aortic syndrome, echo ok. Pain felt to be related to coronary microvascular dysfunction.   - No ASA given apixaban use.  - Intolerant of statins and Zetia as above, does not want referral to lipid clinic.  4. Chronic atrial fibrillation/bradycardia: She is off nodal blockade. - Continue Eliquis 5 mg bid, based on last BMET 5 mg bid is the correct dosing.  - CBC stable 12/16/22: Hgb 10.4. No bleeding.  5. Aortic stenosis: Mild-moderate on last echo.  - Update echo. 6. Physical Deconditioning: Continue HH PT. 7. Hyperkalemia - K 5.3 (4/11) - educated on Ensure consumption - BMET today - off spiro and losartan  Follow  up in 3 months with Dr. Shirlee Latch + echo.  Alen Bleacher, AGACNP-BC  01/04/2023

## 2023-01-05 ENCOUNTER — Telehealth: Payer: Self-pay

## 2023-01-05 ENCOUNTER — Telehealth (HOSPITAL_COMMUNITY): Payer: Self-pay

## 2023-01-05 DIAGNOSIS — I5032 Chronic diastolic (congestive) heart failure: Secondary | ICD-10-CM

## 2023-01-05 DIAGNOSIS — D509 Iron deficiency anemia, unspecified: Secondary | ICD-10-CM | POA: Diagnosis not present

## 2023-01-05 DIAGNOSIS — I350 Nonrheumatic aortic (valve) stenosis: Secondary | ICD-10-CM | POA: Diagnosis not present

## 2023-01-05 DIAGNOSIS — M48061 Spinal stenosis, lumbar region without neurogenic claudication: Secondary | ICD-10-CM | POA: Diagnosis not present

## 2023-01-05 DIAGNOSIS — I130 Hypertensive heart and chronic kidney disease with heart failure and stage 1 through stage 4 chronic kidney disease, or unspecified chronic kidney disease: Secondary | ICD-10-CM | POA: Diagnosis not present

## 2023-01-05 DIAGNOSIS — M1990 Unspecified osteoarthritis, unspecified site: Secondary | ICD-10-CM | POA: Diagnosis not present

## 2023-01-05 DIAGNOSIS — F419 Anxiety disorder, unspecified: Secondary | ICD-10-CM | POA: Diagnosis not present

## 2023-01-05 DIAGNOSIS — S32030D Wedge compression fracture of third lumbar vertebra, subsequent encounter for fracture with routine healing: Secondary | ICD-10-CM | POA: Diagnosis not present

## 2023-01-05 DIAGNOSIS — M47816 Spondylosis without myelopathy or radiculopathy, lumbar region: Secondary | ICD-10-CM | POA: Diagnosis not present

## 2023-01-05 DIAGNOSIS — I2510 Atherosclerotic heart disease of native coronary artery without angina pectoris: Secondary | ICD-10-CM | POA: Diagnosis not present

## 2023-01-05 DIAGNOSIS — J309 Allergic rhinitis, unspecified: Secondary | ICD-10-CM | POA: Diagnosis not present

## 2023-01-05 DIAGNOSIS — I4819 Other persistent atrial fibrillation: Secondary | ICD-10-CM | POA: Diagnosis not present

## 2023-01-05 DIAGNOSIS — N1832 Chronic kidney disease, stage 3b: Secondary | ICD-10-CM | POA: Diagnosis not present

## 2023-01-05 DIAGNOSIS — J449 Chronic obstructive pulmonary disease, unspecified: Secondary | ICD-10-CM | POA: Diagnosis not present

## 2023-01-05 DIAGNOSIS — I5030 Unspecified diastolic (congestive) heart failure: Secondary | ICD-10-CM | POA: Diagnosis not present

## 2023-01-05 DIAGNOSIS — I5181 Takotsubo syndrome: Secondary | ICD-10-CM | POA: Diagnosis not present

## 2023-01-05 DIAGNOSIS — U071 COVID-19: Secondary | ICD-10-CM | POA: Diagnosis not present

## 2023-01-05 MED ORDER — LOKELMA 10 G PO PACK: PACK | ORAL | 0 refills | Status: DC

## 2023-01-05 NOTE — Telephone Encounter (Signed)
Pt called stating she is having muscle spasms in her legs. Pt asks if a muscle relaxer can be sent in for her. Pt asks that it not be Tizanidine because that does not work well for her. Thank you!

## 2023-01-05 NOTE — Telephone Encounter (Signed)
-----   Message from Alen Bleacher, NP sent at 01/04/2023  4:28 PM EDT ----- K too high, Na low.  Reduce K rich foods in diet, including ensure Limit free water Make sure patient is not on multivitamin with potassium Give Lokelma 5g x1 today, repeat BMET Friday

## 2023-01-05 NOTE — Telephone Encounter (Signed)
Patient advised and verbalized understanding,lab appointment scheduled,lab orders entered. New Rx sent into patients pharmacy.   Meds ordered this encounter  Medications   sodium zirconium cyclosilicate (LOKELMA) 10 g PACK packet    Sig: Take 1/2 packet today only.    Dispense:  30 packet    Refill:  0   Orders Placed This Encounter  Procedures   Basic metabolic panel    Standing Status:   Future    Standing Expiration Date:   01/05/2024    Order Specific Question:   Release to patient    Answer:   Immediate    Order Specific Question:   Release to patient    Answer:   Immediate [1]

## 2023-01-06 ENCOUNTER — Telehealth (HOSPITAL_COMMUNITY): Payer: Self-pay

## 2023-01-06 ENCOUNTER — Other Ambulatory Visit (HOSPITAL_COMMUNITY): Payer: Self-pay

## 2023-01-06 ENCOUNTER — Other Ambulatory Visit: Payer: Self-pay | Admitting: Family Medicine

## 2023-01-06 ENCOUNTER — Telehealth: Payer: Self-pay | Admitting: Family Medicine

## 2023-01-06 DIAGNOSIS — F419 Anxiety disorder, unspecified: Secondary | ICD-10-CM | POA: Diagnosis not present

## 2023-01-06 DIAGNOSIS — M1990 Unspecified osteoarthritis, unspecified site: Secondary | ICD-10-CM | POA: Diagnosis not present

## 2023-01-06 DIAGNOSIS — I5030 Unspecified diastolic (congestive) heart failure: Secondary | ICD-10-CM | POA: Diagnosis not present

## 2023-01-06 DIAGNOSIS — I130 Hypertensive heart and chronic kidney disease with heart failure and stage 1 through stage 4 chronic kidney disease, or unspecified chronic kidney disease: Secondary | ICD-10-CM | POA: Diagnosis not present

## 2023-01-06 DIAGNOSIS — I4819 Other persistent atrial fibrillation: Secondary | ICD-10-CM | POA: Diagnosis not present

## 2023-01-06 DIAGNOSIS — M791 Myalgia, unspecified site: Secondary | ICD-10-CM

## 2023-01-06 DIAGNOSIS — I2510 Atherosclerotic heart disease of native coronary artery without angina pectoris: Secondary | ICD-10-CM | POA: Diagnosis not present

## 2023-01-06 DIAGNOSIS — J309 Allergic rhinitis, unspecified: Secondary | ICD-10-CM | POA: Diagnosis not present

## 2023-01-06 DIAGNOSIS — I5181 Takotsubo syndrome: Secondary | ICD-10-CM | POA: Diagnosis not present

## 2023-01-06 DIAGNOSIS — M545 Low back pain, unspecified: Secondary | ICD-10-CM

## 2023-01-06 DIAGNOSIS — S32030D Wedge compression fracture of third lumbar vertebra, subsequent encounter for fracture with routine healing: Secondary | ICD-10-CM | POA: Diagnosis not present

## 2023-01-06 DIAGNOSIS — I350 Nonrheumatic aortic (valve) stenosis: Secondary | ICD-10-CM | POA: Diagnosis not present

## 2023-01-06 DIAGNOSIS — M47816 Spondylosis without myelopathy or radiculopathy, lumbar region: Secondary | ICD-10-CM | POA: Diagnosis not present

## 2023-01-06 DIAGNOSIS — U071 COVID-19: Secondary | ICD-10-CM | POA: Diagnosis not present

## 2023-01-06 DIAGNOSIS — J449 Chronic obstructive pulmonary disease, unspecified: Secondary | ICD-10-CM | POA: Diagnosis not present

## 2023-01-06 DIAGNOSIS — M48061 Spinal stenosis, lumbar region without neurogenic claudication: Secondary | ICD-10-CM | POA: Diagnosis not present

## 2023-01-06 DIAGNOSIS — D509 Iron deficiency anemia, unspecified: Secondary | ICD-10-CM | POA: Diagnosis not present

## 2023-01-06 DIAGNOSIS — N1832 Chronic kidney disease, stage 3b: Secondary | ICD-10-CM | POA: Diagnosis not present

## 2023-01-06 MED ORDER — METHOCARBAMOL 500 MG PO TABS: 500.0000 mg | ORAL_TABLET | Freq: Four times a day (QID) | ORAL | 0 refills | Status: DC | PRN

## 2023-01-06 NOTE — Telephone Encounter (Signed)
She is getting a dose at her PCP office. She called them and they are setting up for her.

## 2023-01-06 NOTE — Telephone Encounter (Signed)
Do you want her to come get a sample and reschedule her labs or give her something else to take?

## 2023-01-06 NOTE — Telephone Encounter (Signed)
Patient would like to know if she can be referred to an orthopedic or someone to help with her muscle pain.  She also states that her Cardiologist put her on another medication and she doesn't want to be on all kinds of medication,  CB# [phone]

## 2023-01-06 NOTE — Telephone Encounter (Signed)
Since it is a one time dose, she should be getting samples.

## 2023-01-06 NOTE — Telephone Encounter (Signed)
[facility] called and she cannot afford Lokelma. Her insurance is not covering it and she cannot afford. Do you have another alternative for her?

## 2023-01-06 NOTE — Telephone Encounter (Signed)
Can you check on this cost for [facility]? She said they are trying to charge her over $900.  Maybe a PA needed?

## 2023-01-07 ENCOUNTER — Telehealth (HOSPITAL_COMMUNITY): Payer: Self-pay

## 2023-01-07 ENCOUNTER — Telehealth: Payer: Self-pay

## 2023-01-07 ENCOUNTER — Ambulatory Visit (HOSPITAL_COMMUNITY)
Admission: RE | Admit: 2023-01-07 | Discharge: 2023-01-07 | Disposition: A | Discharge status: Home / Self Care | Payer: Medicare Other | Source: Ambulatory Visit | Attending: Cardiology | Admitting: Cardiology

## 2023-01-07 DIAGNOSIS — I5032 Chronic diastolic (congestive) heart failure: Secondary | ICD-10-CM | POA: Insufficient documentation

## 2023-01-07 LAB — BASIC METABOLIC PANEL
Anion gap: 12 (ref 5–15)
BUN: 33 mg/dL — ABNORMAL HIGH (ref 8–23)
CO2: 29 mmol/L (ref 22–32)
Calcium: 9.7 mg/dL (ref 8.9–10.3)
Chloride: 91 mmol/L — ABNORMAL LOW (ref 98–111)
Creatinine, Ser: 1.12 mg/dL — ABNORMAL HIGH (ref 0.44–1.00)
GFR, Estimated: 48 mL/min — ABNORMAL LOW (ref >60)
Glucose, Bld: 143 mg/dL — ABNORMAL HIGH (ref 70–99)
Potassium: 4.8 mmol/L (ref 3.5–5.1)
Sodium: 132 mmol/L — ABNORMAL LOW (ref 135–145)

## 2023-01-07 NOTE — Telephone Encounter (Signed)
Stephanie Occupational Therapist with [facility] called in stating that pt will not be meeting all of her OT goals due to her back pain. If there are any questions you [place]rum contact:  Judeth Cornfield OT with [facility] CB#: [phone]

## 2023-01-07 NOTE — Telephone Encounter (Signed)
Referral to orthopedic placed on 01/07/23 per pcp   "I am fine with orthopedic consult. "

## 2023-01-07 NOTE — Telephone Encounter (Signed)
[name] called with chest pain and tightness. She wanted to be seen here, but I advised her to go to ER to be evaluated. She is having shortness of breath as well and pain feels like pressure.

## 2023-01-07 NOTE — Addendum Note (Signed)
Addended by: Arta Silence on: 01/07/2023 08:57 AM   Modules accepted: Orders

## 2023-01-08 ENCOUNTER — Emergency Department (HOSPITAL_COMMUNITY): Payer: Medicare Other

## 2023-01-08 ENCOUNTER — Encounter (HOSPITAL_COMMUNITY): Payer: Self-pay

## 2023-01-08 ENCOUNTER — Other Ambulatory Visit: Payer: Self-pay

## 2023-01-08 ENCOUNTER — Inpatient Hospital Stay (HOSPITAL_COMMUNITY)
Admission: EM | Admit: 2023-01-08 | Discharge: 2023-01-17 | DRG: 551 | Disposition: A | Discharge status: Skilled Nursing Facility | Payer: Medicare Other | Attending: Internal Medicine | Admitting: Internal Medicine

## 2023-01-08 DIAGNOSIS — S199XXA Unspecified injury of neck, initial encounter: Secondary | ICD-10-CM | POA: Diagnosis not present

## 2023-01-08 DIAGNOSIS — T502X5A Adverse effect of carbonic-anhydrase inhibitors, benzothiadiazides and other diuretics, initial encounter: Secondary | ICD-10-CM | POA: Diagnosis present

## 2023-01-08 DIAGNOSIS — I350 Nonrheumatic aortic (valve) stenosis: Secondary | ICD-10-CM | POA: Diagnosis not present

## 2023-01-08 DIAGNOSIS — E878 Other disorders of electrolyte and fluid balance, not elsewhere classified: Secondary | ICD-10-CM | POA: Diagnosis present

## 2023-01-08 DIAGNOSIS — M79672 Pain in left foot: Secondary | ICD-10-CM | POA: Diagnosis not present

## 2023-01-08 DIAGNOSIS — I252 Old myocardial infarction: Secondary | ICD-10-CM

## 2023-01-08 DIAGNOSIS — I5032 Chronic diastolic (congestive) heart failure: Secondary | ICD-10-CM

## 2023-01-08 DIAGNOSIS — K5790 Diverticulosis of intestine, part unspecified, without perforation or abscess without bleeding: Secondary | ICD-10-CM | POA: Diagnosis not present

## 2023-01-08 DIAGNOSIS — Z79899 Other long term (current) drug therapy: Secondary | ICD-10-CM

## 2023-01-08 DIAGNOSIS — S82401A Unspecified fracture of shaft of right fibula, initial encounter for closed fracture: Secondary | ICD-10-CM | POA: Diagnosis present

## 2023-01-08 DIAGNOSIS — Z91041 Radiographic dye allergy status: Secondary | ICD-10-CM

## 2023-01-08 DIAGNOSIS — F064 Anxiety disorder due to known physiological condition: Secondary | ICD-10-CM

## 2023-01-08 DIAGNOSIS — M25572 Pain in left ankle and joints of left foot: Secondary | ICD-10-CM | POA: Diagnosis not present

## 2023-01-08 DIAGNOSIS — W19XXXA Unspecified fall, initial encounter: Principal | ICD-10-CM

## 2023-01-08 DIAGNOSIS — I130 Hypertensive heart and chronic kidney disease with heart failure and stage 1 through stage 4 chronic kidney disease, or unspecified chronic kidney disease: Secondary | ICD-10-CM | POA: Diagnosis not present

## 2023-01-08 DIAGNOSIS — S32059A Unspecified fracture of fifth lumbar vertebra, initial encounter for closed fracture: Secondary | ICD-10-CM | POA: Diagnosis not present

## 2023-01-08 DIAGNOSIS — E871 Hypo-osmolality and hyponatremia: Secondary | ICD-10-CM | POA: Diagnosis not present

## 2023-01-08 DIAGNOSIS — Z8673 Personal history of transient ischemic attack (TIA), and cerebral infarction without residual deficits: Secondary | ICD-10-CM

## 2023-01-08 DIAGNOSIS — S82451A Displaced comminuted fracture of shaft of right fibula, initial encounter for closed fracture: Secondary | ICD-10-CM | POA: Diagnosis not present

## 2023-01-08 DIAGNOSIS — W010XXA Fall on same level from slipping, tripping and stumbling without subsequent striking against object, initial encounter: Secondary | ICD-10-CM | POA: Diagnosis not present

## 2023-01-08 DIAGNOSIS — K5792 Diverticulitis of intestine, part unspecified, without perforation or abscess without bleeding: Secondary | ICD-10-CM

## 2023-01-08 DIAGNOSIS — K2971 Gastritis, unspecified, with bleeding: Secondary | ICD-10-CM | POA: Diagnosis not present

## 2023-01-08 DIAGNOSIS — S32000A Wedge compression fracture of unspecified lumbar vertebra, initial encounter for closed fracture: Secondary | ICD-10-CM | POA: Diagnosis present

## 2023-01-08 DIAGNOSIS — I080 Rheumatic disorders of both mitral and aortic valves: Secondary | ICD-10-CM | POA: Diagnosis present

## 2023-01-08 DIAGNOSIS — Z85038 Personal history of other malignant neoplasm of large intestine: Secondary | ICD-10-CM

## 2023-01-08 DIAGNOSIS — I272 Pulmonary hypertension, unspecified: Secondary | ICD-10-CM | POA: Diagnosis not present

## 2023-01-08 DIAGNOSIS — E875 Hyperkalemia: Secondary | ICD-10-CM | POA: Diagnosis not present

## 2023-01-08 DIAGNOSIS — J449 Chronic obstructive pulmonary disease, unspecified: Secondary | ICD-10-CM | POA: Diagnosis not present

## 2023-01-08 DIAGNOSIS — K224 Dyskinesia of esophagus: Secondary | ICD-10-CM | POA: Diagnosis not present

## 2023-01-08 DIAGNOSIS — D638 Anemia in other chronic diseases classified elsewhere: Secondary | ICD-10-CM | POA: Diagnosis not present

## 2023-01-08 DIAGNOSIS — R1031 Right lower quadrant pain: Secondary | ICD-10-CM | POA: Diagnosis not present

## 2023-01-08 DIAGNOSIS — E559 Vitamin D deficiency, unspecified: Secondary | ICD-10-CM | POA: Diagnosis not present

## 2023-01-08 DIAGNOSIS — M25551 Pain in right hip: Secondary | ICD-10-CM | POA: Diagnosis not present

## 2023-01-08 DIAGNOSIS — I4819 Other persistent atrial fibrillation: Secondary | ICD-10-CM | POA: Diagnosis not present

## 2023-01-08 DIAGNOSIS — D62 Acute posthemorrhagic anemia: Secondary | ICD-10-CM | POA: Diagnosis present

## 2023-01-08 DIAGNOSIS — Z8249 Family history of ischemic heart disease and other diseases of the circulatory system: Secondary | ICD-10-CM

## 2023-01-08 DIAGNOSIS — Y92009 Unspecified place in unspecified non-institutional (private) residence as the place of occurrence of the external cause: Secondary | ICD-10-CM

## 2023-01-08 DIAGNOSIS — D631 Anemia in chronic kidney disease: Secondary | ICD-10-CM | POA: Diagnosis present

## 2023-01-08 DIAGNOSIS — Z853 Personal history of malignant neoplasm of breast: Secondary | ICD-10-CM

## 2023-01-08 DIAGNOSIS — K5520 Angiodysplasia of colon without hemorrhage: Secondary | ICD-10-CM | POA: Diagnosis not present

## 2023-01-08 DIAGNOSIS — F411 Generalized anxiety disorder: Secondary | ICD-10-CM

## 2023-01-08 DIAGNOSIS — K317 Polyp of stomach and duodenum: Secondary | ICD-10-CM | POA: Diagnosis not present

## 2023-01-08 DIAGNOSIS — I4821 Permanent atrial fibrillation: Secondary | ICD-10-CM | POA: Diagnosis not present

## 2023-01-08 DIAGNOSIS — I2510 Atherosclerotic heart disease of native coronary artery without angina pectoris: Secondary | ICD-10-CM | POA: Diagnosis present

## 2023-01-08 DIAGNOSIS — K2289 Other specified disease of esophagus: Secondary | ICD-10-CM | POA: Diagnosis not present

## 2023-01-08 DIAGNOSIS — Z823 Family history of stroke: Secondary | ICD-10-CM

## 2023-01-08 DIAGNOSIS — K2970 Gastritis, unspecified, without bleeding: Secondary | ICD-10-CM | POA: Diagnosis not present

## 2023-01-08 DIAGNOSIS — M8580 Other specified disorders of bone density and structure, unspecified site: Secondary | ICD-10-CM | POA: Diagnosis not present

## 2023-01-08 DIAGNOSIS — N1832 Chronic kidney disease, stage 3b: Secondary | ICD-10-CM | POA: Diagnosis present

## 2023-01-08 DIAGNOSIS — M25552 Pain in left hip: Secondary | ICD-10-CM | POA: Diagnosis not present

## 2023-01-08 DIAGNOSIS — Z888 Allergy status to other drugs, medicaments and biological substances status: Secondary | ICD-10-CM

## 2023-01-08 DIAGNOSIS — S82401D Unspecified fracture of shaft of right fibula, subsequent encounter for closed fracture with routine healing: Secondary | ICD-10-CM | POA: Diagnosis not present

## 2023-01-08 DIAGNOSIS — I6381 Other cerebral infarction due to occlusion or stenosis of small artery: Secondary | ICD-10-CM | POA: Diagnosis not present

## 2023-01-08 DIAGNOSIS — K31819 Angiodysplasia of stomach and duodenum without bleeding: Secondary | ICD-10-CM | POA: Diagnosis not present

## 2023-01-08 DIAGNOSIS — Z7401 Bed confinement status: Secondary | ICD-10-CM | POA: Diagnosis not present

## 2023-01-08 DIAGNOSIS — K219 Gastro-esophageal reflux disease without esophagitis: Secondary | ICD-10-CM | POA: Diagnosis present

## 2023-01-08 DIAGNOSIS — R600 Localized edema: Secondary | ICD-10-CM | POA: Diagnosis not present

## 2023-01-08 DIAGNOSIS — Z91048 Other nonmedicinal substance allergy status: Secondary | ICD-10-CM

## 2023-01-08 DIAGNOSIS — Z7901 Long term (current) use of anticoagulants: Secondary | ICD-10-CM | POA: Diagnosis not present

## 2023-01-08 DIAGNOSIS — S32050D Wedge compression fracture of fifth lumbar vertebra, subsequent encounter for fracture with routine healing: Secondary | ICD-10-CM | POA: Diagnosis not present

## 2023-01-08 DIAGNOSIS — S82831A Other fracture of upper and lower end of right fibula, initial encounter for closed fracture: Secondary | ICD-10-CM | POA: Diagnosis not present

## 2023-01-08 DIAGNOSIS — S32040A Wedge compression fracture of fourth lumbar vertebra, initial encounter for closed fracture: Secondary | ICD-10-CM | POA: Diagnosis not present

## 2023-01-08 DIAGNOSIS — M25569 Pain in unspecified knee: Secondary | ICD-10-CM | POA: Diagnosis not present

## 2023-01-08 DIAGNOSIS — M545 Low back pain, unspecified: Secondary | ICD-10-CM | POA: Diagnosis not present

## 2023-01-08 DIAGNOSIS — K31811 Angiodysplasia of stomach and duodenum with bleeding: Secondary | ICD-10-CM | POA: Diagnosis not present

## 2023-01-08 DIAGNOSIS — M4856XA Collapsed vertebra, not elsewhere classified, lumbar region, initial encounter for fracture: Secondary | ICD-10-CM | POA: Diagnosis not present

## 2023-01-08 DIAGNOSIS — M79651 Pain in right thigh: Secondary | ICD-10-CM | POA: Diagnosis not present

## 2023-01-08 DIAGNOSIS — I10 Essential (primary) hypertension: Secondary | ICD-10-CM | POA: Diagnosis not present

## 2023-01-08 DIAGNOSIS — I110 Hypertensive heart disease with heart failure: Secondary | ICD-10-CM | POA: Diagnosis not present

## 2023-01-08 DIAGNOSIS — D509 Iron deficiency anemia, unspecified: Secondary | ICD-10-CM | POA: Diagnosis not present

## 2023-01-08 DIAGNOSIS — S82451P Displaced comminuted fracture of shaft of right fibula, subsequent encounter for closed fracture with malunion: Secondary | ICD-10-CM | POA: Diagnosis present

## 2023-01-08 DIAGNOSIS — E785 Hyperlipidemia, unspecified: Secondary | ICD-10-CM | POA: Diagnosis present

## 2023-01-08 DIAGNOSIS — S82421P Displaced transverse fracture of shaft of right fibula, subsequent encounter for closed fracture with malunion: Secondary | ICD-10-CM | POA: Diagnosis not present

## 2023-01-08 DIAGNOSIS — I509 Heart failure, unspecified: Secondary | ICD-10-CM | POA: Diagnosis not present

## 2023-01-08 DIAGNOSIS — D500 Iron deficiency anemia secondary to blood loss (chronic): Secondary | ICD-10-CM | POA: Diagnosis not present

## 2023-01-08 DIAGNOSIS — Z7951 Long term (current) use of inhaled steroids: Secondary | ICD-10-CM

## 2023-01-08 DIAGNOSIS — K5900 Constipation, unspecified: Secondary | ICD-10-CM | POA: Diagnosis present

## 2023-01-08 DIAGNOSIS — R531 Weakness: Secondary | ICD-10-CM | POA: Diagnosis not present

## 2023-01-08 DIAGNOSIS — M79661 Pain in right lower leg: Secondary | ICD-10-CM | POA: Diagnosis not present

## 2023-01-08 DIAGNOSIS — Z809 Family history of malignant neoplasm, unspecified: Secondary | ICD-10-CM

## 2023-01-08 DIAGNOSIS — S32050A Wedge compression fracture of fifth lumbar vertebra, initial encounter for closed fracture: Secondary | ICD-10-CM

## 2023-01-08 DIAGNOSIS — W19XXXD Unspecified fall, subsequent encounter: Secondary | ICD-10-CM | POA: Diagnosis not present

## 2023-01-08 DIAGNOSIS — K921 Melena: Secondary | ICD-10-CM | POA: Diagnosis not present

## 2023-01-08 DIAGNOSIS — Z882 Allergy status to sulfonamides status: Secondary | ICD-10-CM

## 2023-01-08 DIAGNOSIS — Z886 Allergy status to analgesic agent status: Secondary | ICD-10-CM

## 2023-01-08 DIAGNOSIS — Z800 Family history of malignant neoplasm of digestive organs: Secondary | ICD-10-CM

## 2023-01-08 LAB — CBC
HCT: 29.2 % — ABNORMAL LOW (ref 36.0–46.0)
Hemoglobin: 9.4 g/dL — ABNORMAL LOW (ref 12.0–15.0)
MCH: 30.8 pg (ref 26.0–34.0)
MCHC: 32.2 g/dL (ref 30.0–36.0)
MCV: 95.7 fL (ref 80.0–100.0)
Platelets: 233 10*3/uL (ref 150–400)
RBC: 3.05 MIL/uL — ABNORMAL LOW (ref 3.87–5.11)
RDW: 14.4 % (ref 11.5–15.5)
WBC: 8.1 10*3/uL (ref 4.0–10.5)
nRBC: 0 % (ref 0.0–0.2)

## 2023-01-08 LAB — BASIC METABOLIC PANEL
Anion gap: 9 (ref 5–15)
BUN: 35 mg/dL — ABNORMAL HIGH (ref 8–23)
CO2: 30 mmol/L (ref 22–32)
Calcium: 8.7 mg/dL — ABNORMAL LOW (ref 8.9–10.3)
Chloride: 92 mmol/L — ABNORMAL LOW (ref 98–111)
Creatinine, Ser: 1.13 mg/dL — ABNORMAL HIGH (ref 0.44–1.00)
GFR, Estimated: 47 mL/min — ABNORMAL LOW (ref >60)
Glucose, Bld: 154 mg/dL — ABNORMAL HIGH (ref 70–99)
Potassium: 4.3 mmol/L (ref 3.5–5.1)
Sodium: 131 mmol/L — ABNORMAL LOW (ref 135–145)

## 2023-01-08 MED ORDER — FENTANYL CITRATE PF 50 MCG/ML IJ SOSY
25.0000 ug | PREFILLED_SYRINGE | Freq: Once | INTRAMUSCULAR | Status: AC
  Administered 2023-01-08: 25 ug via INTRAMUSCULAR
  Filled 2023-01-08: qty 1

## 2023-01-08 MED ORDER — ACETAMINOPHEN 325 MG PO TABS
650.0000 mg | ORAL_TABLET | Freq: Four times a day (QID) | ORAL | Status: DC | PRN
  Administered 2023-01-08 – 2023-01-11 (×3): 650 mg via ORAL
  Filled 2023-01-08 (×4): qty 2

## 2023-01-08 MED ORDER — ONDANSETRON 4 MG PO TBDP
4.0000 mg | ORAL_TABLET | Freq: Once | ORAL | Status: AC
  Administered 2023-01-08: 4 mg via ORAL
  Filled 2023-01-08: qty 1

## 2023-01-08 MED ORDER — MELATONIN 3 MG PO TABS
3.0000 mg | ORAL_TABLET | Freq: Every evening | ORAL | Status: DC | PRN
  Administered 2023-01-09 – 2023-01-16 (×6): 3 mg via ORAL
  Filled 2023-01-08 (×6): qty 1

## 2023-01-08 MED ORDER — CALCITONIN (SALMON) 200 UNIT/ACT NA SOLN
1.0000 | Freq: Every day | NASAL | Status: DC
  Administered 2023-01-09 – 2023-01-17 (×8): 1 via NASAL
  Filled 2023-01-08: qty 3.7

## 2023-01-08 MED ORDER — ONDANSETRON HCL 4 MG/2ML IJ SOLN
4.0000 mg | Freq: Four times a day (QID) | INTRAMUSCULAR | Status: DC | PRN
  Administered 2023-01-11 – 2023-01-14 (×4): 4 mg via INTRAVENOUS
  Filled 2023-01-08 (×4): qty 2

## 2023-01-08 MED ORDER — MORPHINE SULFATE (PF) 4 MG/ML IV SOLN
4.0000 mg | Freq: Once | INTRAVENOUS | Status: AC
  Administered 2023-01-08: 4 mg via INTRAMUSCULAR
  Filled 2023-01-08: qty 1

## 2023-01-08 MED ORDER — MORPHINE SULFATE (PF) 4 MG/ML IV SOLN
4.0000 mg | INTRAVENOUS | Status: DC | PRN
  Administered 2023-01-09 (×2): 4 mg via INTRAVENOUS
  Filled 2023-01-08 (×2): qty 1

## 2023-01-08 MED ORDER — NALOXONE HCL 0.4 MG/ML IJ SOLN: 0.4000 mg | INTRAMUSCULAR | Status: DC | PRN

## 2023-01-08 NOTE — ED Provider Notes (Incomplete)
Care assumed from [facility], PA-C at shift change. Please see their note for further information.   Briefly: Patient with afib on Elliquis presents for mechanical fall with back pain and right sided hip and knee pain. Has not tried to walk since the event.  Plan: Patients x-rays are unremarkable, Cts ordered given patients level of pain and are pending at shift change. If normal, plan to attempt ambulation which will determine dispo.  Patient's CT pelvis and right femur have resulted and reveal  CT pelvis:  1. New L5 superior endplate concave central compression fracture with 30% loss of the central vertebral height, 20% posterior height loss and slight posterosuperior cortical retropulsion and fragmentation. 2. I would assume this is probably acute although could have occurred anytime since 11/18/2022 MRI. 3. No canal hematoma is seen. The degree of acquired spinal stenosis at L4-5 has not appreciably changed since 09/01/2022. 4. Interval L4 kyphoplasty with further fragmentation of the L4 inferior endplate since the March studies. 5. Osteopenia and degenerative change with no displaced pelvic, sacral or proximal femoral fractures. 6. Constipation and diverticulosis.  Postsurgical changes.  CT femur:  1. Acute nondisplaced transverse fracture across the fibular head and neck junction. 2. Osteopenia and degenerative change. 3. No displaced fracture of the right femur or visualized proximal tibia. 4. Small low-density suprapatellar bursal effusion. No fat-fluid level or hemarthrosis.  I have personally reviewed and interpreted this imaging and agree with radiology interpretation.  Discussed these findings with the patient, given she lives at home alone and ambulates with a walker at baseline and is requiring IV pain control, feel patient would benefit from [facility] for pain control and PT/OT consult. Discussed patient with orthopedic surgery Dr. Shon Baton who will see the patient  in consultation tomorrow. Patient is understanding and amenable with plan

## 2023-01-08 NOTE — Progress Notes (Signed)
Orthopedic Tech Progress Note Patient Details:  [name] 04-04-1935 [id number]  Ortho Devices Type of Ortho Device: Ace wrap, Cotton web roll, Short leg splint Ortho Device/Splint Location: RLE Ortho Device/Splint Interventions: Ordered, Application, Adjustment   Post Interventions Patient Tolerated: Well Instructions Provided: Care of device  [name] 01/08/2023, 10:43 PM

## 2023-01-08 NOTE — ED Provider Notes (Signed)
[place] [facility] AT [facility] Provider Note   CSN: [id number] Arrival date & time: 01/08/23  1647     History {Add pertinent medical, surgical, social history, OB history to HPI:1} Chief Complaint  Patient presents with   [name] is a 88 y.o. female with past medical history COPD, hypertension, hyperlipidemia, A-fib who presents to the ED post mechanical fall.  Patient states that she was trying to go to the bathroom to have a bowel movement when she stumbled and fell down on her right knee between her walker and the wall.  She states that she is having pain to both of her legs but most prominent to the right hip, right knee, and left foot.  She also has considerable pain to both lower legs.  She had a recent procedure on her back a few weeks ago and is recovering from that but denies any acute back pain.  She is anticoagulated on Eliquis but denies hitting her head and remembers all of the injury.  She states that she did not lose consciousness and she has no headache, focal weakness, vision changes, neck pain, chest pain, abdominal pain, or other complaints apart from her legs.  At baseline, patient ambulates using a walker.  Family member at bedside confirms story.      Home Medications Prior to Admission medications   Medication Sig Start Date End Date Taking? Authorizing Provider  methocarbamol (ROBAXIN) 500 MG tablet Take 1 tablet (500 mg total) by mouth every 6 (six) hours as needed for muscle spasms. 01/06/23   Donita Brooks, MD  albuterol (VENTOLIN HFA) 108 (90 Base) MCG/ACT inhaler Inhale 2 puffs into the lungs every 6 (six) hours as needed for wheezing or shortness of breath. 10/27/22   Park Meo, FNP  apixaban (ELIQUIS) 5 MG TABS tablet Take 1 tablet (5 mg total) by mouth 2 (two) times daily. 11/24/22   Almon Hercules, MD  bisacodyl (DULCOLAX) 10 MG suppository Place 1 suppository (10 mg total) rectally as needed for moderate  constipation. 12/24/22   Donita Brooks, MD  budesonide-formoterol (SYMBICORT) 80-4.5 MCG/ACT inhaler Inhale 2 puffs into the lungs in the morning and at bedtime. Patient taking differently: Inhale 2 puffs into the lungs See admin instructions. 2 puffs every morning, [place] take an additional 2 puffs later in the day if needed for wheezing, shortness of breath. 11/15/22   Raechel Chute, MD  clonazePAM (KLONOPIN) 0.5 MG tablet Take 1 tablet by mouth twice daily as needed for anxiety 01/04/23   Donita Brooks, MD  cloNIDine (CATAPRES) 0.2 MG tablet Take 1 tablet (0.2 mg total) by mouth 2 (two) times daily. 01/04/23   Milford, Anderson Malta, FNP  famotidine (PEPCID) 20 MG tablet Take 20 mg by mouth at bedtime as needed for heartburn.    [provider]  ferrous sulfate 325 (65 FE) MG tablet Take 1 tablet (325 mg total) by mouth daily with breakfast. 07/04/20   Donita Brooks, MD  fluticasone (FLONASE) 50 MCG/ACT nasal spray Place 1 spray into both nostrils daily as needed for allergies or rhinitis.    [provider]  HYDROmorphone (DILAUDID) 4 MG tablet Take 1 tablet (4 mg total) by mouth every 4 (four) hours as needed for severe pain. 12/28/22   Donita Brooks, MD  nitroGLYCERIN (NITROSTAT) 0.4 MG SL tablet DISSOLVE ONE TABLET UNDER THE TONGUE EVERY 5 MINUTES AS NEEDED FOR CHEST PAIN.  DO NOT  EXCEED A TOTAL OF 3 DOSES IN 15 MINUTES 11/06/21   Turner, Cornelious Bryant, MD  ondansetron (ZOFRAN-ODT) 4 MG disintegrating tablet Take 1 tablet (4 mg total) by mouth every 8 (eight) hours as needed for nausea or vomiting. 12/17/22   Donita Brooks, MD  oxyCODONE-acetaminophen (PERCOCET) 10-325 MG tablet Take 1 tablet by mouth every 4 (four) hours as needed for pain (stop dilaudid). 12/28/22 01/27/23  Donita Brooks, MD  polyethylene glycol powder (MIRALAX) 17 GM/SCOOP powder Take 17 g by mouth 2 (two) times daily as needed for mild constipation. 11/24/22   Almon Hercules, MD  predniSONE (DELTASONE) 20 MG  tablet 3 tabs poqday 1-2, 2 tabs poqday 3-4, 1 tab poqday 5-6 12/21/22   Donita Brooks, MD  ranolazine (RANEXA) 500 MG 12 hr tablet Take 500 mg by mouth daily as needed (chest pain).    [provider]  senna-docusate (SENOKOT-S) 8.6-50 MG tablet Take 2 tablets by mouth 2 (two) times daily between meals as needed for moderate constipation. 11/24/22   Almon Hercules, MD  sodium zirconium cyclosilicate (LOKELMA) 10 g PACK packet Take 1/2 packet today only. 01/05/23   Alen Bleacher, NP  torsemide (DEMADEX) 20 MG tablet Take 40 mg by mouth 2 (two) times daily.    [provider]      Allergies    Contrast media [iodinated contrast media], Clonidine derivatives, Statins, Sulfa antibiotics, Celebrex [celecoxib], Isordil [isosorbide nitrate], and Tape    Review of Systems   Review of Systems  All other systems reviewed and are negative.   Physical Exam Updated Vital Signs BP (!) 177/37   Pulse 69   Temp 98.8 F (37.1 C) (Oral)   Resp 16   Ht 5\' 5"  (1.651 m)   Wt 75.8 kg   SpO2 91%   BMI 27.79 kg/m  Physical Exam Vitals and nursing note reviewed.  Constitutional:      General: She is not in acute distress.    Appearance: Normal appearance. She is not ill-appearing or toxic-appearing.  HENT:     Head: Normocephalic and atraumatic.     Right Ear: External ear normal.     Left Ear: External ear normal.     Nose: Nose normal.     Mouth/Throat:     Mouth: Mucous membranes are moist.  Eyes:     Extraocular Movements: Extraocular movements intact.     Conjunctiva/sclera: Conjunctivae normal.  Cardiovascular:     Rate and Rhythm: Normal rate and regular rhythm.     Heart sounds: No murmur heard. Pulmonary:     Effort: Pulmonary effort is normal.     Breath sounds: Normal breath sounds.  Chest:     Chest wall: No tenderness.  Abdominal:     General: Abdomen is flat. There is no distension.     Palpations: Abdomen is soft.     Tenderness: There is no abdominal  tenderness. There is no guarding or rebound.  Musculoskeletal:     Cervical back: Normal range of motion and neck supple. No rigidity or tenderness.     Comments: Tenderness to bilateral hips, worse on the right, pelvis feels stable; tenderness over the anterior right knee without deformity, swelling, ecchymosis, or other skin changes; minimal tenderness over bilateral proximal femurs without deformity; moderate tenderness to the right tib-fib and mild tenderness to the left tib-fib, no deformity or overlying skin changes; tenderness to the plantar surface of the left foot, no wounds, no deformity, no  skin changes, examination of right foot is normal; patient neurovascularly intact distally; range of motion of both arms is intact and without deformity or complaints on palpation, chest wall stable and also nontender to palpation and without signs of trauma; no midline CTL spinal tenderness, step-offs, or deformities  Skin:    General: Skin is warm and dry.     Capillary Refill: Capillary refill takes less than 2 seconds.  Neurological:     General: No focal deficit present.     Mental Status: She is alert and oriented to person, place, and time.     Cranial Nerves: Cranial nerves 2-12 are intact.     Sensory: Sensation is intact.     Comments: Some difficulty assessing strength given patient's pain to the lower extremities but exam appears nonfocal and with 5/5 strength to bilateral upper and lower extremities though overall range of motion is slightly limited secondary to pain  Psychiatric:        Mood and Affect: Mood normal.        Behavior: Behavior normal.     ED Results / Procedures / Treatments   Labs (all labs ordered are listed, but only abnormal results are displayed) Labs Reviewed - No data to display  EKG None  Radiology CT PELVIS WO CONTRAST  Result Date: 01/08/2023 CLINICAL DATA:  Right hip trauma, fracture suspected, right thigh pain. Fell today. EXAM: CT PELVIS WITHOUT  CONTRAST TECHNIQUE: Multidetector CT imaging of the pelvis was performed following the standard protocol without intravenous contrast. RADIATION DOSE REDUCTION: This exam was performed according to the departmental dose-optimization program which includes automated exposure control, adjustment of the mA and/or kV according to patient size and/or use of iterative reconstruction technique. COMPARISON:  Lumbar spine MRI 11/18/2022, lumbar spine CT 11/17/2022, CT abdomen and pelvis 09/01/2022. FINDINGS: Urinary Tract: The pelvic ureters are normal caliber. No ureteral stones are seen. The bladder wall and lumen are unremarkable. Bowel: Old postsurgical change transverse colon and rectosigmoid junction. Moderate fecal stasis. No bowel dilatation, wall thickening or inflammatory changes in the pelvis. Vascular/Lymphatic: There is aortoiliac scattered calcific plaque. No aneurysm is seen. Please note the aorta is only included just above the bifurcation. No lymphadenopathy or mass is seen. Reproductive:  No mass or other significant abnormality Other: Old cholecystectomy. Lower portions of the liver are unremarkable. There is no free air, free hemorrhage, fluid collections or ascites. There are no incarcerated hernias. Musculoskeletal: Osteopenia. There is no sacral insufficiency fracture. No displaced fracture of the bony pelvis. Spurring and erosive changes are noted chronically at the symphysis pubis. The visualized proximal femurs are intact. There is mild symmetric joint space loss at the hips, small acetabular and bilateral femoral head spurs, without erosive arthropathy or joint effusions. At L5, there is a new (since the lumbar MRI of 11/18/2022) and possibly acute superior endplate concave central compression fracture of the L5 vertebral body, with 30% loss of the central vertebral height, slight posterosuperior cortical retropulsion, 20% posterior height loss and no notable anterior height loss. Interval  kyphoplasty has been performed at L4 which is only partially imaged through the inferior endplate with the L4 inferior endplate having undergone further fragmentation since the prior studies. The pedicles and posterior elements are intact. No canal hematoma is seen. Acquired spinal stenosis, moderate to severe at the L4-5 level appears unchanged since 09/01/2022. Facet hypertrophy and lumbar spondylosis, and spinous process abutment L3-S1 are unchanged. IMPRESSION: 1. New L5 superior endplate concave central compression fracture with 30%  loss of the central vertebral height, 20% posterior height loss and slight posterosuperior cortical retropulsion and fragmentation. 2. I would assume this is probably acute although could have occurred anytime since 11/18/2022 MRI. 3. No canal hematoma is seen. The degree of acquired spinal stenosis at L4-5 has not appreciably changed since 09/01/2022. 4. Interval L4 kyphoplasty with further fragmentation of the L4 inferior endplate since the March studies. 5. Osteopenia and degenerative change with no displaced pelvic, sacral or proximal femoral fractures. 6. Constipation and diverticulosis.  Postsurgical changes. 7. Aortic atherosclerosis. Aortic Atherosclerosis (ICD10-I70.0). Electronically Signed   By: Almira Bar M.D.   On: 01/08/2023 21:03   DG Knee Right Port  Result Date: 01/08/2023 CLINICAL DATA:  Pain, fell EXAM: PORTABLE RIGHT KNEE - 1-2 VIEW COMPARISON:  None Available. FINDINGS: Frontal, bilateral oblique, lateral views of the right knee are obtained. No acute fracture, subluxation, or dislocation. Mild 3 compartmental osteoarthritis. No joint effusion. Diffuse subcutaneous edema. IMPRESSION: 1. No acute fracture. 2. 3 compartmental osteoarthritis. 3. Diffuse subcutaneous edema. Electronically Signed   By: Sharlet Salina M.D.   On: 01/08/2023 18:52   DG Tibia/Fibula Left  Result Date: 01/08/2023 CLINICAL DATA:  Larey Seat, pain EXAM: LEFT TIBIA AND FIBULA - 2 VIEW  COMPARISON:  None Available. FINDINGS: Frontal and lateral views of the left tibia and fibula are obtained. The bones are diffusely osteopenic. No acute fracture. Alignment of the left knee and ankle is anatomic. Mild osteoarthritis. There is diffuse subcutaneous edema. IMPRESSION: 1. No acute displaced fracture. 2. Osteopenia. 3. Osteoarthritis of the left knee and ankle. 4. Diffuse soft tissue edema. Electronically Signed   By: Sharlet Salina M.D.   On: 01/08/2023 18:50   DG Tibia/Fibula Right  Result Date: 01/08/2023 CLINICAL DATA:  Larey Seat, pain EXAM: RIGHT TIBIA AND FIBULA - 2 VIEW COMPARISON:  None Available. FINDINGS: Frontal and lateral views of the right tibia and fibula are obtained. Bones are diffusely osteopenic. No acute displaced fracture. Alignment is anatomic. Mild osteoarthritis of the right hip and knee. Mild diffuse subcutaneous edema. IMPRESSION: 1. No acute fracture. 2. Osteopenia. 3. Osteoarthritis of the right knee and ankle. Electronically Signed   By: Sharlet Salina M.D.   On: 01/08/2023 18:49   DG Hips Bilat W or Wo Pelvis 3-4 Views  Result Date: 01/08/2023 CLINICAL DATA:  Bilateral hip and thigh pain after fall EXAM: DG HIP (WITH OR WITHOUT PELVIS) 3-4V BILAT COMPARISON:  None Available. FINDINGS: Demineralization. There is no evidence of hip fracture or dislocation. Degenerative changes pubic symphysis, both hips, SI joints and lower lumbar spine. Vertebroplasty L4. IMPRESSION: No acute fracture or dislocation. Electronically Signed   By: Minerva Fester M.D.   On: 01/08/2023 18:48   DG Foot 2 Views Left  Result Date: 01/08/2023 CLINICAL DATA:  Larey Seat, left foot pain EXAM: LEFT FOOT - 2 VIEW COMPARISON:  None Available. FINDINGS: Frontal and lateral views of the left foot are obtained. The bones are diffusely osteopenic. Hammertoe deformities are noted. There are no acute displaced fractures. There is multifocal osteoarthritis greatest at the first metatarsophalangeal joint and  midfoot. Diffuse soft tissue edema. IMPRESSION: 1. No acute fracture. 2. Osteopenia, with hammertoe deformities and multifocal osteoarthritis as above. 3. Diffuse subcutaneous edema. Electronically Signed   By: Sharlet Salina M.D.   On: 01/08/2023 18:47    Procedures Procedures  {Document cardiac monitor, telemetry assessment procedure when appropriate:1}  Medications Ordered in ED Medications  acetaminophen (TYLENOL) tablet 650 mg (650 mg Oral Given 01/08/23 2039)  morphine (PF) 4 MG/ML injection 4 mg (4 mg Intramuscular Given 01/08/23 1839)  ondansetron (ZOFRAN-ODT) disintegrating tablet 4 mg (4 mg Oral Given 01/08/23 1839)  fentaNYL (SUBLIMAZE) injection 25 mcg (25 mcg Intramuscular Given 01/08/23 2039)    ED Course/ Medical Decision Making/ A&P   {   Click here for ABCD2, HEART and other calculatorsREFRESH Note before signing :1}                          Medical Decision Making Amount and/or Complexity of Data Reviewed Radiology: ordered. Decision-making details documented in ED Course.  Risk OTC drugs. Prescription drug management.   Medical Decision Making:   Stehanie Kukulka Lipa is a 88 y.o. female who presented to the ED today with fall detailed above.    Additional history discussed with patient's family/caregivers.  Patient's presentation is complicated by their history of advanced age, multiple medical comorbidities.  Complete initial physical exam performed, notably the patient was in no acute distress.  She did have multiple areas of tenderness over the bilateral lower extremities as above.  No obvious deformity.  Neurovascularly intact.  Nonfocal neuroexam.  No midline spinal tenderness.  No signs of head injury.  Patient alert and oriented and appears neurologically intact.    Reviewed and confirmed nursing documentation for past medical history, family history, social history.    Initial Assessment:   With the patient's presentation of fall, differential diagnosis includes but is  not limited to fracture, dislocation, sprain, strain, contusion, abrasion, laceration, hematoma, head injury. This is most consistent with an acute complicated illness  Initial Plan:  X-rays to assess for bony pathology Symptomatic treatment Objective evaluation as below reviewed   Initial Study Results:   Radiology:  All images reviewed independently. Agree with radiology report at this time.   CT PELVIS WO CONTRAST  Result Date: 01/08/2023 CLINICAL DATA:  Right hip trauma, fracture suspected, right thigh pain. Fell today. EXAM: CT PELVIS WITHOUT CONTRAST TECHNIQUE: Multidetector CT imaging of the pelvis was performed following the standard protocol without intravenous contrast. RADIATION DOSE REDUCTION: This exam was performed according to the departmental dose-optimization program which includes automated exposure control, adjustment of the mA and/or kV according to patient size and/or use of iterative reconstruction technique. COMPARISON:  Lumbar spine MRI 11/18/2022, lumbar spine CT 11/17/2022, CT abdomen and pelvis 09/01/2022. FINDINGS: Urinary Tract: The pelvic ureters are normal caliber. No ureteral stones are seen. The bladder wall and lumen are unremarkable. Bowel: Old postsurgical change transverse colon and rectosigmoid junction. Moderate fecal stasis. No bowel dilatation, wall thickening or inflammatory changes in the pelvis. Vascular/Lymphatic: There is aortoiliac scattered calcific plaque. No aneurysm is seen. Please note the aorta is only included just above the bifurcation. No lymphadenopathy or mass is seen. Reproductive:  No mass or other significant abnormality Other: Old cholecystectomy. Lower portions of the liver are unremarkable. There is no free air, free hemorrhage, fluid collections or ascites. There are no incarcerated hernias. Musculoskeletal: Osteopenia. There is no sacral insufficiency fracture. No displaced fracture of the bony pelvis. Spurring and erosive changes are  noted chronically at the symphysis pubis. The visualized proximal femurs are intact. There is mild symmetric joint space loss at the hips, small acetabular and bilateral femoral head spurs, without erosive arthropathy or joint effusions. At L5, there is a new (since the lumbar MRI of 11/18/2022) and possibly acute superior endplate concave central compression fracture of the L5 vertebral body, with 30% loss of the  central vertebral height, slight posterosuperior cortical retropulsion, 20% posterior height loss and no notable anterior height loss. Interval kyphoplasty has been performed at L4 which is only partially imaged through the inferior endplate with the L4 inferior endplate having undergone further fragmentation since the prior studies. The pedicles and posterior elements are intact. No canal hematoma is seen. Acquired spinal stenosis, moderate to severe at the L4-5 level appears unchanged since 09/01/2022. Facet hypertrophy and lumbar spondylosis, and spinous process abutment L3-S1 are unchanged. IMPRESSION: 1. New L5 superior endplate concave central compression fracture with 30% loss of the central vertebral height, 20% posterior height loss and slight posterosuperior cortical retropulsion and fragmentation. 2. I would assume this is probably acute although could have occurred anytime since 11/18/2022 MRI. 3. No canal hematoma is seen. The degree of acquired spinal stenosis at L4-5 has not appreciably changed since 09/01/2022. 4. Interval L4 kyphoplasty with further fragmentation of the L4 inferior endplate since the March studies. 5. Osteopenia and degenerative change with no displaced pelvic, sacral or proximal femoral fractures. 6. Constipation and diverticulosis.  Postsurgical changes. 7. Aortic atherosclerosis. Aortic Atherosclerosis (ICD10-I70.0). Electronically Signed   By: Almira Bar M.D.   On: 01/08/2023 21:03   DG Knee Right Port  Result Date: 01/08/2023 CLINICAL DATA:  Pain, fell EXAM:  PORTABLE RIGHT KNEE - 1-2 VIEW COMPARISON:  None Available. FINDINGS: Frontal, bilateral oblique, lateral views of the right knee are obtained. No acute fracture, subluxation, or dislocation. Mild 3 compartmental osteoarthritis. No joint effusion. Diffuse subcutaneous edema. IMPRESSION: 1. No acute fracture. 2. 3 compartmental osteoarthritis. 3. Diffuse subcutaneous edema. Electronically Signed   By: Sharlet Salina M.D.   On: 01/08/2023 18:52   DG Tibia/Fibula Left  Result Date: 01/08/2023 CLINICAL DATA:  Larey Seat, pain EXAM: LEFT TIBIA AND FIBULA - 2 VIEW COMPARISON:  None Available. FINDINGS: Frontal and lateral views of the left tibia and fibula are obtained. The bones are diffusely osteopenic. No acute fracture. Alignment of the left knee and ankle is anatomic. Mild osteoarthritis. There is diffuse subcutaneous edema. IMPRESSION: 1. No acute displaced fracture. 2. Osteopenia. 3. Osteoarthritis of the left knee and ankle. 4. Diffuse soft tissue edema. Electronically Signed   By: Sharlet Salina M.D.   On: 01/08/2023 18:50   DG Tibia/Fibula Right  Result Date: 01/08/2023 CLINICAL DATA:  Larey Seat, pain EXAM: RIGHT TIBIA AND FIBULA - 2 VIEW COMPARISON:  None Available. FINDINGS: Frontal and lateral views of the right tibia and fibula are obtained. Bones are diffusely osteopenic. No acute displaced fracture. Alignment is anatomic. Mild osteoarthritis of the right hip and knee. Mild diffuse subcutaneous edema. IMPRESSION: 1. No acute fracture. 2. Osteopenia. 3. Osteoarthritis of the right knee and ankle. Electronically Signed   By: Sharlet Salina M.D.   On: 01/08/2023 18:49   DG Hips Bilat W or Wo Pelvis 3-4 Views  Result Date: 01/08/2023 CLINICAL DATA:  Bilateral hip and thigh pain after fall EXAM: DG HIP (WITH OR WITHOUT PELVIS) 3-4V BILAT COMPARISON:  None Available. FINDINGS: Demineralization. There is no evidence of hip fracture or dislocation. Degenerative changes pubic symphysis, both hips, SI joints and lower  lumbar spine. Vertebroplasty L4. IMPRESSION: No acute fracture or dislocation. Electronically Signed   By: Minerva Fester M.D.   On: 01/08/2023 18:48   DG Foot 2 Views Left  Result Date: 01/08/2023 CLINICAL DATA:  Larey Seat, left foot pain EXAM: LEFT FOOT - 2 VIEW COMPARISON:  None Available. FINDINGS: Frontal and lateral views of the left foot are obtained.  The bones are diffusely osteopenic. Hammertoe deformities are noted. There are no acute displaced fractures. There is multifocal osteoarthritis greatest at the first metatarsophalangeal joint and midfoot. Diffuse soft tissue edema. IMPRESSION: 1. No acute fracture. 2. Osteopenia, with hammertoe deformities and multifocal osteoarthritis as above. 3. Diffuse subcutaneous edema. Electronically Signed   By: Sharlet Salina M.D.   On: 01/08/2023 18:47   DG Hip Unilat W OR W/O Pelvis 2-3 Views Right  Result Date: 12/18/2022 CLINICAL DATA:  hip pain EXAM: DG HIP (WITH OR WITHOUT PELVIS) 2-3V RIGHT COMPARISON:  X-ray pelvis 11/18/2022 FINDINGS: There is no evidence of hip fracture or dislocation of the right hip. There is no evidence of arthropathy or other focal bone abnormality. Bowel stable sutures overlie the pelvis. IMPRESSION: Negative for acute traumatic injury. Electronically Signed   By: Tish Frederickson M.D.   On: 12/18/2022 23:27      Final Assessment and Plan:   ***    Clinical Impression:  1. Fall, initial encounter      Data Unavailable    {Document critical care time when appropriate:1} {Document review of labs and clinical decision tools ie heart score, Chads2Vasc2 etc:1}  {Document your independent review of radiology images, and any outside records:1} {Document your discussion with family members, caretakers, and with consultants:1} {Document social determinants of health affecting pt's care:1} {Document your decision making why or why not admission, treatments were needed:1} Final Clinical Impression(s) / ED Diagnoses Final  diagnoses:  Fall, initial encounter    Rx / DC Orders ED Discharge Orders     None

## 2023-01-08 NOTE — H&P (Signed)
History and Physical      [name] ONG:[id number] DOB: [birth year] DOA: 01/08/2023; DOS: 01/08/2023  PCP: Donita Brooks, MD *** Patient coming from: home ***  I have personally briefly reviewed patient's old medical records in [facility]  Chief Complaint: ***  HPI: [name] is a 88 y.o. female with medical history significant for *** who is admitted to [facility] on 01/08/2023 with *** after presenting from home*** to [facility] ED complaining of ***.   ***        ***  ED Course:  Vital signs in the ED were notable for the following: ***  Labs were notable for the following: ***  Per my interpretation, EKG in ED demonstrated the following:  ***  Imaging and additional notable ED work-up: ***  While in the ED, the following were administered: ***  Subsequently, the patient was admitted  ***  ***red   Review of Systems: As per HPI otherwise 10 point review of systems negative.   Past Medical History:  Diagnosis Date   Allergy    rhinitis   Aortic stenosis    mild by echo 06/2017   Arthritis    Bradycardia    a. 10/2017 -> beta blocker cut back due to HR 39.   Breast cancer (HCC) 01/06/2012   Cancer (HCC)    right colon and left breast   Chronic diastolic CHF (congestive heart failure) (HCC)    Colon cancer (HCC) 01/06/2012   Colovesical fistula    Dr. Carolynne Edouard and Dr. Laverle Patter planning surgery 08/2018- surgery revealed spontaneous closure   COPD (chronic obstructive pulmonary disease) (HCC)    pt. denies   Coronary artery disease 2006   a.  NSTEMI in 2016, cath showed 15% prox-mid RCA, 20% prox LAD, EF 25-35% by cath and 35-40% -> felt due to Takotsubo cardiomyopathy.   Dilated aortic root (HCC)    by echo 06/2017   Diverticulosis    Dyspnea    Edema extremities    GERD (gastroesophageal reflux disease)    Hernia    Hiatal hernia    denies   Hyperlipidemia    Hypertension    Mild aortic stenosis    echo 11/2015 but not noted on echo 06/2016    Osteopenia    Permanent atrial fibrillation (HCC)    chronic atrial fibrillation   Pneumonia    hx child   Pulmonary HTN (HCC)    a. moderate to severe PASP echo 11/2015 - now by echo 06/2017. CTA chest in 11/16 with no PE. PFTs in 7/15 with mild obstructive lung disease. She had a negative sleep study in 2017. b. Felt due to left sided HF.   Stroke [facility])    Takotsubo syndrome 07/29/2015   a. EF 35-40% by echo; akinesis of mid-apical anteroseptal and apical myocardium.  EF now normalized on echo 11/2015    Past Surgical History:  Procedure Laterality Date   APPENDECTOMY     BREAST SURGERY     lumpectomy left   CARDIAC CATHETERIZATION     CARDIAC CATHETERIZATION N/A 07/28/2015   Procedure: Left Heart Cath and Coronary Angiography;  Surgeon: Peter M Swaziland, MD;  Location: [facility] INVASIVE CV LAB;  Service: Cardiovascular;  Laterality: N/A;   CHOLECYSTECTOMY     COLECTOMY     right side   CYSTOSCOPY WITH STENT PLACEMENT Left 09/04/2018   Procedure: CYSTOSCOPY WITH LEFT STENT PLACEMENT, BLADDER REPAIR, CYSTOSCOPY WITH LEFT STENT REMOVAL;  Surgeon:  Heloise Purpura, MD;  Location: [facility] ORS;  Service: Urology;  Laterality: Left;   EXCISION OF ACCESSORY NIPPLE Bilateral 05/30/2013   Procedure: BILATERAL NIPPLE BIOPSY;  Surgeon: Robyne Askew, MD;  Location: [facility] OR;  Service: General;  Laterality: Bilateral;   EYE SURGERY Bilateral 12   cataracts   HYSTEROSCOPY WITH D & C N/A 05/29/2020   Procedure: DILATATION AND CURETTAGE /HYSTEROSCOPY;  Surgeon: Natale Milch, MD;  Location: [facility] ORS;  Service: Gynecology;  Laterality: N/A;   IR KYPHO EA ADDL LEVEL THORACIC OR LUMBAR  11/22/2022   IR KYPHO LUMBAR INC FX REDUCE BONE BX UNI/BIL CANNULATION INC/IMAGING  11/22/2022   IR RADIOLOGIST EVAL & MGMT  07/26/2017   LAPAROSCOPIC RIGHT COLECTOMY N/A 09/04/2018   Procedure: LAPAROSCOPIC ASSISTED SIGMOID COLECTOMY WITH REPAIR OF FISTULA TO BLADDER;  Surgeon: Griselda Miner, MD;  Location: [facility]  ORS;  Service: General;  Laterality: N/A;   RIGHT/LEFT HEART CATH AND CORONARY ANGIOGRAPHY N/A 12/10/2020   Procedure: RIGHT/LEFT HEART CATH AND CORONARY ANGIOGRAPHY;  Surgeon: Laurey Morale, MD;  Location: [facility] INVASIVE CV LAB;  Service: Cardiovascular;  Laterality: N/A;   SPLIT NIGHT STUDY  02/02/2016        Social History:  reports that she has never smoked. She has never been exposed to tobacco smoke. She has never used smokeless tobacco. She reports that she does not drink alcohol and does not use drugs.   Allergies  Allergen Reactions   Contrast Media [Iodinated Contrast Media] Hives and Other (See Comments)    Chest pain and burning    Clonidine Derivatives Other (See Comments)    Dry mouth, throat   Statins Other (See Comments)    Myalgias   Sulfa Antibiotics Diarrhea    Tremors    Celebrex [Celecoxib] Rash   Isordil [Isosorbide Nitrate] Itching and Rash   Tape Itching and Rash    Redness at application site Reaction is to paper and plastic tapes and EKG leads    Family History  Problem Relation Age of Onset   Heart disease Mother    Heart attack Mother    Cancer Sister        stomach and colon   Heart disease Brother 21   Hypertension Father    Cancer Sister    Stroke Neg Hx     Family history reviewed and not pertinent ***   Prior to Admission medications   Medication Sig Start Date End Date Taking? Authorizing Provider  methocarbamol (ROBAXIN) 500 MG tablet Take 1 tablet (500 mg total) by mouth every 6 (six) hours as needed for muscle spasms. 01/06/23   Donita Brooks, MD  albuterol (VENTOLIN HFA) 108 (90 Base) MCG/ACT inhaler Inhale 2 puffs into the lungs every 6 (six) hours as needed for wheezing or shortness of breath. 10/27/22   Park Meo, FNP  apixaban (ELIQUIS) 5 MG TABS tablet Take 1 tablet (5 mg total) by mouth 2 (two) times daily. 11/24/22   Almon Hercules, MD  bisacodyl (DULCOLAX) 10 MG suppository Place 1 suppository (10 mg total) rectally as  needed for moderate constipation. 12/24/22   Donita Brooks, MD  budesonide-formoterol (SYMBICORT) 80-4.5 MCG/ACT inhaler Inhale 2 puffs into the lungs in the morning and at bedtime. Patient taking differently: Inhale 2 puffs into the lungs See admin instructions. 2 puffs every morning, [place] take an additional 2 puffs later in the day if needed for wheezing, shortness of breath. 11/15/22   Raechel Chute, MD  clonazePAM (KLONOPIN) 0.5 MG tablet Take 1 tablet by mouth twice daily as needed for anxiety 01/04/23   Donita Brooks, MD  cloNIDine (CATAPRES) 0.2 MG tablet Take 1 tablet (0.2 mg total) by mouth 2 (two) times daily. 01/04/23   Milford, Anderson Malta, FNP  famotidine (PEPCID) 20 MG tablet Take 20 mg by mouth at bedtime as needed for heartburn.    [provider]  ferrous sulfate 325 (65 FE) MG tablet Take 1 tablet (325 mg total) by mouth daily with breakfast. 07/04/20   Donita Brooks, MD  fluticasone (FLONASE) 50 MCG/ACT nasal spray Place 1 spray into both nostrils daily as needed for allergies or rhinitis.    [provider]  HYDROmorphone (DILAUDID) 4 MG tablet Take 1 tablet (4 mg total) by mouth every 4 (four) hours as needed for severe pain. 12/28/22   Donita Brooks, MD  nitroGLYCERIN (NITROSTAT) 0.4 MG SL tablet DISSOLVE ONE TABLET UNDER THE TONGUE EVERY 5 MINUTES AS NEEDED FOR CHEST PAIN.  DO NOT EXCEED A TOTAL OF 3 DOSES IN 15 MINUTES 11/06/21   Turner, Cornelious Bryant, MD  ondansetron (ZOFRAN-ODT) 4 MG disintegrating tablet Take 1 tablet (4 mg total) by mouth every 8 (eight) hours as needed for nausea or vomiting. 12/17/22   Donita Brooks, MD  oxyCODONE-acetaminophen (PERCOCET) 10-325 MG tablet Take 1 tablet by mouth every 4 (four) hours as needed for pain (stop dilaudid). 12/28/22 01/27/23  Donita Brooks, MD  polyethylene glycol powder (MIRALAX) 17 GM/SCOOP powder Take 17 g by mouth 2 (two) times daily as needed for mild constipation. 11/24/22   Almon Hercules, MD   predniSONE (DELTASONE) 20 MG tablet 3 tabs poqday 1-2, 2 tabs poqday 3-4, 1 tab poqday 5-6 12/21/22   Donita Brooks, MD  ranolazine (RANEXA) 500 MG 12 hr tablet Take 500 mg by mouth daily as needed (chest pain).    [provider]  senna-docusate (SENOKOT-S) 8.6-50 MG tablet Take 2 tablets by mouth 2 (two) times daily between meals as needed for moderate constipation. 11/24/22   Almon Hercules, MD  sodium zirconium cyclosilicate (LOKELMA) 10 g PACK packet Take 1/2 packet today only. 01/05/23   Alen Bleacher, NP  torsemide (DEMADEX) 20 MG tablet Take 40 mg by mouth 2 (two) times daily.    [provider]     Objective    Physical Exam: Vitals:   01/08/23 1700 01/08/23 1923 01/08/23 2100 01/08/23 2347  BP:  (!) 177/37 (!) 155/47 (!) 131/52  Pulse:  69 70 (!) 56  Resp:  16 (!) 22 15  Temp:  98.8 F (37.1 C)  98.3 F (36.8 C)  TempSrc:  Oral  Oral  SpO2:  91%  96%  Weight: 75.8 kg     Height: 5\' 5"  (1.651 m)       General: appears to be stated age; alert, oriented Skin: warm, dry, no rash Head:  AT/[place] Mouth:  Oral mucosa membranes appear moist, normal dentition Neck: supple; trachea midline Heart:  RRR; did not appreciate any M/R/G Lungs: CTAB, did not appreciate any wheezes, rales, or rhonchi Abdomen: + BS; soft, ND, NT Vascular: 2+ pedal pulses b/l; 2+ radial pulses b/l Extremities: no peripheral edema, no muscle wasting Neuro: strength and sensation intact in upper and lower extremities b/l    *** Neuro: 5/5 strength of the proximal and distal flexors and extensors of the upper and lower extremities bilaterally; sensation intact in upper and lower extremities b/l;  cranial nerves II through XII grossly intact; no pronator drift; no evidence suggestive of slurred speech, dysarthria, or facial droop; Normal muscle tone. No tremors. *** Neuro: In the setting of the patient's current mental status and associated inability to follow instructions, unable to  perform full neurologic exam at this time.  As such, assessment of strength, sensation, and cranial nerves is limited at this time. Patient noted to spontaneously move all 4 extremities. No tremors.  ***    Labs on Admission: I have personally reviewed following labs and imaging studies  CBC: Recent Labs  Lab 01/08/23 2233  WBC 8.1  HGB 9.4*  HCT 29.2*  MCV 95.7  PLT 233   Basic Metabolic Panel: Recent Labs  Lab 01/04/23 1413 01/07/23 1524 01/08/23 2233  NA 125* 132* 131*  K 5.5* 4.8 4.3  CL 87* 91* 92*  CO2 27 29 30   GLUCOSE 145* 143* 154*  BUN 47* 33* 35*  CREATININE 1.27* 1.12* 1.13*  CALCIUM 9.4 9.7 8.7*   GFR: Estimated Creatinine Clearance: 36.4 mL/min (A) (by C-G formula based on SCr of 1.13 mg/dL (H)). Liver Function Tests: No results for input(s): "AST", "ALT", "ALKPHOS", "BILITOT", "PROT", "ALBUMIN" in the last 168 hours. No results for input(s): "LIPASE", "AMYLASE" in the last 168 hours. No results for input(s): "AMMONIA" in the last 168 hours. Coagulation Profile: No results for input(s): "INR", "PROTIME" in the last 168 hours. Cardiac Enzymes: No results for input(s): "CKTOTAL", "CKMB", "CKMBINDEX", "TROPONINI" in the last 168 hours. BNP (last 3 results) No results for input(s): "PROBNP" in the last 8760 hours. HbA1C: No results for input(s): "HGBA1C" in the last 72 hours. CBG: No results for input(s): "GLUCAP" in the last 168 hours. Lipid Profile: No results for input(s): "CHOL", "HDL", "LDLCALC", "TRIG", "CHOLHDL", "LDLDIRECT" in the last 72 hours. Thyroid Function Tests: No results for input(s): "TSH", "T4TOTAL", "FREET4", "T3FREE", "THYROIDAB" in the last 72 hours. Anemia Panel: No results for input(s): "VITAMINB12", "FOLATE", "FERRITIN", "TIBC", "IRON", "RETICCTPCT" in the last 72 hours. Urine analysis:    Component Value Date/Time   COLORURINE STRAW (A) 11/18/2022 1006   APPEARANCEUR CLEAR 11/18/2022 1006   LABSPEC 1.006 11/18/2022 1006    PHURINE 5.0 11/18/2022 1006   GLUCOSEU NEGATIVE 11/18/2022 1006   HGBUR NEGATIVE 11/18/2022 1006   BILIRUBINUR NEGATIVE 11/18/2022 1006   KETONESUR NEGATIVE 11/18/2022 1006   PROTEINUR NEGATIVE 11/18/2022 1006   UROBILINOGEN 0.2 11/04/2013 1321   NITRITE NEGATIVE 11/18/2022 1006   LEUKOCYTESUR NEGATIVE 11/18/2022 1006    Radiological Exams on Admission: CT Head Wo Contrast  Result Date: 01/08/2023 CLINICAL DATA:  Head trauma, minor (Age >= 65y); Neck trauma (Age >= 65y) EXAM: CT HEAD WITHOUT CONTRAST CT CERVICAL SPINE WITHOUT CONTRAST TECHNIQUE: Multidetector CT imaging of the head and cervical spine was performed following the standard protocol without intravenous contrast. Multiplanar CT image reconstructions of the cervical spine were also generated. RADIATION DOSE REDUCTION: This exam was performed according to the departmental dose-optimization program which includes automated exposure control, adjustment of the mA and/or kV according to patient size and/or use of iterative reconstruction technique. COMPARISON:  None Available. FINDINGS: CT HEAD FINDINGS Brain: Normal anatomic configuration. Parenchymal volume loss is commensurate with the patient's age. Mild periventricular white matter changes are present likely reflecting the sequela of small vessel ischemia. Remote lacunar infarct within the left thalamus. No abnormal intra or extra-axial mass lesion or fluid collection. No abnormal mass effect or midline shift. No evidence of acute intracranial hemorrhage or infarct. Stable borderline  ventriculomegaly which appears commensurate with the degree of parenchymal volume loss and likely represents central atrophy. Cerebellum unremarkable. Vascular: No asymmetric hyperdense vasculature at the skull base. Skull: Intact Sinuses/Orbits: Paranasal sinuses are clear. Orbits are unremarkable. Other: Mastoid air cells and middle ear cavities are clear. CT CERVICAL SPINE FINDINGS Alignment: Normal. Skull  base and vertebrae: No acute fracture. No primary bone lesion or focal pathologic process. Soft tissues and spinal canal: No prevertebral fluid or swelling. No visible canal hematoma. Disc levels: Intervertebral disc space narrowing endplate remodeling noted in keeping with changes of diffuse mild to moderate degenerative disc disease most severe at C6-7. Prevertebral soft tissues are not thickened on sagittal reformats. Spinal canal is widely patent. No high-grade neuroforaminal narrowing. Upper chest: Biapical scarring. Other: 2.9 cm left thyroid nodule noted, previous assessed on sonogram of 04/02/2014 and biopsied on 04/16/2014. IMPRESSION: 1. No acute intracranial abnormality. No calvarial fracture. 2. Stable senescent change. 3. No acute fracture or listhesis of the cervical spine. 4. 2.9 cm left thyroid nodule noted, previous assessed on sonogram of 04/02/2014 and biopsied on 04/16/2014. Electronically Signed   By: Helyn Numbers M.D.   On: 01/08/2023 23:12   CT Cervical Spine Wo Contrast  Result Date: 01/08/2023 CLINICAL DATA:  Head trauma, minor (Age >= 65y); Neck trauma (Age >= 65y) EXAM: CT HEAD WITHOUT CONTRAST CT CERVICAL SPINE WITHOUT CONTRAST TECHNIQUE: Multidetector CT imaging of the head and cervical spine was performed following the standard protocol without intravenous contrast. Multiplanar CT image reconstructions of the cervical spine were also generated. RADIATION DOSE REDUCTION: This exam was performed according to the departmental dose-optimization program which includes automated exposure control, adjustment of the mA and/or kV according to patient size and/or use of iterative reconstruction technique. COMPARISON:  None Available. FINDINGS: CT HEAD FINDINGS Brain: Normal anatomic configuration. Parenchymal volume loss is commensurate with the patient's age. Mild periventricular white matter changes are present likely reflecting the sequela of small vessel ischemia. Remote lacunar infarct  within the left thalamus. No abnormal intra or extra-axial mass lesion or fluid collection. No abnormal mass effect or midline shift. No evidence of acute intracranial hemorrhage or infarct. Stable borderline ventriculomegaly which appears commensurate with the degree of parenchymal volume loss and likely represents central atrophy. Cerebellum unremarkable. Vascular: No asymmetric hyperdense vasculature at the skull base. Skull: Intact Sinuses/Orbits: Paranasal sinuses are clear. Orbits are unremarkable. Other: Mastoid air cells and middle ear cavities are clear. CT CERVICAL SPINE FINDINGS Alignment: Normal. Skull base and vertebrae: No acute fracture. No primary bone lesion or focal pathologic process. Soft tissues and spinal canal: No prevertebral fluid or swelling. No visible canal hematoma. Disc levels: Intervertebral disc space narrowing endplate remodeling noted in keeping with changes of diffuse mild to moderate degenerative disc disease most severe at C6-7. Prevertebral soft tissues are not thickened on sagittal reformats. Spinal canal is widely patent. No high-grade neuroforaminal narrowing. Upper chest: Biapical scarring. Other: 2.9 cm left thyroid nodule noted, previous assessed on sonogram of 04/02/2014 and biopsied on 04/16/2014. IMPRESSION: 1. No acute intracranial abnormality. No calvarial fracture. 2. Stable senescent change. 3. No acute fracture or listhesis of the cervical spine. 4. 2.9 cm left thyroid nodule noted, previous assessed on sonogram of 04/02/2014 and biopsied on 04/16/2014. Electronically Signed   By: Helyn Numbers M.D.   On: 01/08/2023 23:12   CT FEMUR RIGHT WO CONTRAST  Result Date: 01/08/2023 CLINICAL DATA:  Mid femoral pain.  Fell. EXAM: CT OF THE LOWER RIGHT THIGH WITHOUT CONTRAST TECHNIQUE:  Multidetector CT imaging of the right lower extremity was performed according to the standard protocol. RADIATION DOSE REDUCTION: This exam was performed according to the departmental  dose-optimization program which includes automated exposure control, adjustment of the mA and/or kV according to patient size and/or use of iterative reconstruction technique. COMPARISON:  CT of abdomen and pelvis 09/01/2022, right knee films from today, AP pelvis and bilateral hip films from today, and right hip views from 12/16/2022. FINDINGS: Bones/Joint/Cartilage There is diffuse osteopenia. No displaced fracture seen of the visualized right hemipelvis, visualized right hemisacrum, the femur, and visualized proximal tibia. There is a nondisplaced acute transverse fracture across the fibular head and neck junction. There is mild nonerosive degenerative arthrosis at the right SI joint and right hip. No hip joint effusion is seen. There are tricompartmental degenerative changes at the knee, consisting of joint space loss and osteophytes of the lateral greater than medial femorotibial compartments and of the patellofemoral joint. There is no erosive arthropathy. There is a small low-density suprapatellar bursal effusion. There are degenerative subcortical cystic changes in the posterior aspect of the lateral femoral condyle. Ligaments Suboptimally assessed by CT. Muscles and Tendons Suboptimal depiction of the tendons with CT. No major tendon avulsion is seen. The extensor mechanism is intact. there is partial atrophy of the hip adductor musculature and gluteal muscles, partial atrophy of the vastus lateralis and medial head of the gastrocnemius. No intramuscular hematoma or mass are seen. Soft tissues There is no subcutaneous hematoma. No fluid, mass or hemorrhage in the right hemipelvis. Mild calcific plaque noted in the femoral artery, tibioperoneal trunk and proximal anterior tibial artery. IMPRESSION: 1. Acute nondisplaced transverse fracture across the fibular head and neck junction. 2. Osteopenia and degenerative change. 3. No displaced fracture of the right femur or visualized proximal tibia. 4. Small  low-density suprapatellar bursal effusion. No fat-fluid level or hemarthrosis. 5. Calcific plaque in the femoral artery, tibioperoneal trunk and proximal anterior tibial artery. 6. Remaining findings discussed above. Electronically Signed   By: Almira Bar M.D.   On: 01/08/2023 21:24   CT PELVIS WO CONTRAST  Result Date: 01/08/2023 CLINICAL DATA:  Right hip trauma, fracture suspected, right thigh pain. Fell today. EXAM: CT PELVIS WITHOUT CONTRAST TECHNIQUE: Multidetector CT imaging of the pelvis was performed following the standard protocol without intravenous contrast. RADIATION DOSE REDUCTION: This exam was performed according to the departmental dose-optimization program which includes automated exposure control, adjustment of the mA and/or kV according to patient size and/or use of iterative reconstruction technique. COMPARISON:  Lumbar spine MRI 11/18/2022, lumbar spine CT 11/17/2022, CT abdomen and pelvis 09/01/2022. FINDINGS: Urinary Tract: The pelvic ureters are normal caliber. No ureteral stones are seen. The bladder wall and lumen are unremarkable. Bowel: Old postsurgical change transverse colon and rectosigmoid junction. Moderate fecal stasis. No bowel dilatation, wall thickening or inflammatory changes in the pelvis. Vascular/Lymphatic: There is aortoiliac scattered calcific plaque. No aneurysm is seen. Please note the aorta is only included just above the bifurcation. No lymphadenopathy or mass is seen. Reproductive:  No mass or other significant abnormality Other: Old cholecystectomy. Lower portions of the liver are unremarkable. There is no free air, free hemorrhage, fluid collections or ascites. There are no incarcerated hernias. Musculoskeletal: Osteopenia. There is no sacral insufficiency fracture. No displaced fracture of the bony pelvis. Spurring and erosive changes are noted chronically at the symphysis pubis. The visualized proximal femurs are intact. There is mild symmetric joint space  loss at the hips, small acetabular and bilateral  femoral head spurs, without erosive arthropathy or joint effusions. At L5, there is a new (since the lumbar MRI of 11/18/2022) and possibly acute superior endplate concave central compression fracture of the L5 vertebral body, with 30% loss of the central vertebral height, slight posterosuperior cortical retropulsion, 20% posterior height loss and no notable anterior height loss. Interval kyphoplasty has been performed at L4 which is only partially imaged through the inferior endplate with the L4 inferior endplate having undergone further fragmentation since the prior studies. The pedicles and posterior elements are intact. No canal hematoma is seen. Acquired spinal stenosis, moderate to severe at the L4-5 level appears unchanged since 09/01/2022. Facet hypertrophy and lumbar spondylosis, and spinous process abutment L3-S1 are unchanged. IMPRESSION: 1. New L5 superior endplate concave central compression fracture with 30% loss of the central vertebral height, 20% posterior height loss and slight posterosuperior cortical retropulsion and fragmentation. 2. I would assume this is probably acute although could have occurred anytime since 11/18/2022 MRI. 3. No canal hematoma is seen. The degree of acquired spinal stenosis at L4-5 has not appreciably changed since 09/01/2022. 4. Interval L4 kyphoplasty with further fragmentation of the L4 inferior endplate since the March studies. 5. Osteopenia and degenerative change with no displaced pelvic, sacral or proximal femoral fractures. 6. Constipation and diverticulosis.  Postsurgical changes. 7. Aortic atherosclerosis. Aortic Atherosclerosis (ICD10-I70.0). Electronically Signed   By: Almira Bar M.D.   On: 01/08/2023 21:03   DG Knee Right Port  Result Date: 01/08/2023 CLINICAL DATA:  Pain, fell EXAM: PORTABLE RIGHT KNEE - 1-2 VIEW COMPARISON:  None Available. FINDINGS: Frontal, bilateral oblique, lateral views of the  right knee are obtained. No acute fracture, subluxation, or dislocation. Mild 3 compartmental osteoarthritis. No joint effusion. Diffuse subcutaneous edema. IMPRESSION: 1. No acute fracture. 2. 3 compartmental osteoarthritis. 3. Diffuse subcutaneous edema. Electronically Signed   By: Sharlet Salina M.D.   On: 01/08/2023 18:52   DG Tibia/Fibula Left  Result Date: 01/08/2023 CLINICAL DATA:  Larey Seat, pain EXAM: LEFT TIBIA AND FIBULA - 2 VIEW COMPARISON:  None Available. FINDINGS: Frontal and lateral views of the left tibia and fibula are obtained. The bones are diffusely osteopenic. No acute fracture. Alignment of the left knee and ankle is anatomic. Mild osteoarthritis. There is diffuse subcutaneous edema. IMPRESSION: 1. No acute displaced fracture. 2. Osteopenia. 3. Osteoarthritis of the left knee and ankle. 4. Diffuse soft tissue edema. Electronically Signed   By: Sharlet Salina M.D.   On: 01/08/2023 18:50   DG Tibia/Fibula Right  Result Date: 01/08/2023 CLINICAL DATA:  Larey Seat, pain EXAM: RIGHT TIBIA AND FIBULA - 2 VIEW COMPARISON:  None Available. FINDINGS: Frontal and lateral views of the right tibia and fibula are obtained. Bones are diffusely osteopenic. No acute displaced fracture. Alignment is anatomic. Mild osteoarthritis of the right hip and knee. Mild diffuse subcutaneous edema. IMPRESSION: 1. No acute fracture. 2. Osteopenia. 3. Osteoarthritis of the right knee and ankle. Electronically Signed   By: Sharlet Salina M.D.   On: 01/08/2023 18:49   DG Hips Bilat W or Wo Pelvis 3-4 Views  Result Date: 01/08/2023 CLINICAL DATA:  Bilateral hip and thigh pain after fall EXAM: DG HIP (WITH OR WITHOUT PELVIS) 3-4V BILAT COMPARISON:  None Available. FINDINGS: Demineralization. There is no evidence of hip fracture or dislocation. Degenerative changes pubic symphysis, both hips, SI joints and lower lumbar spine. Vertebroplasty L4. IMPRESSION: No acute fracture or dislocation. Electronically Signed   By: Minerva Fester M.D.   On: 01/08/2023  18:48   DG Foot 2 Views Left  Result Date: 01/08/2023 CLINICAL DATA:  Larey Seat, left foot pain EXAM: LEFT FOOT - 2 VIEW COMPARISON:  None Available. FINDINGS: Frontal and lateral views of the left foot are obtained. The bones are diffusely osteopenic. Hammertoe deformities are noted. There are no acute displaced fractures. There is multifocal osteoarthritis greatest at the first metatarsophalangeal joint and midfoot. Diffuse soft tissue edema. IMPRESSION: 1. No acute fracture. 2. Osteopenia, with hammertoe deformities and multifocal osteoarthritis as above. 3. Diffuse subcutaneous edema. Electronically Signed   By: Sharlet Salina M.D.   On: 01/08/2023 18:47      Assessment/Plan    Principal Problem:   Closed compression fracture of L5 vertebra (HCC)  ***      ***          ***           ***          ***          ***          ***          ***          ***          ***          ***          ***          ***          ***     DVT prophylaxis: SCD's ***  Code Status: Full code*** Family Communication: none*** Disposition Plan: Per Rounding Team Consults called: none***;  Admission status: ***    I SPENT GREATER THAN 75 *** MINUTES IN CLINICAL CARE TIME/MEDICAL DECISION-MAKING IN COMPLETING THIS ADMISSION.     Chaney Born Zakai Gonyea DO [facility] From 7PM - 7AM   01/08/2023, 11:57 PM   ***

## 2023-01-08 NOTE — ED Provider Notes (Signed)
Care assumed from [facility], PA-C at shift change. Please see their note for further information.   Briefly: Patient with afib on Elliquis presents for mechanical fall with back pain and right sided hip and knee pain. Has not tried to walk since the event.  Plan: Patients x-rays are unremarkable, Cts ordered given patients level of pain and are pending at shift change. If normal, plan to attempt ambulation which will determine dispo.  Patient's CT pelvis and right femur have resulted and reveal  CT pelvis:  1. New L5 superior endplate concave central compression fracture with 30% loss of the central vertebral height, 20% posterior height loss and slight posterosuperior cortical retropulsion and fragmentation. 2. I would assume this is probably acute although could have occurred anytime since 11/18/2022 MRI. 3. No canal hematoma is seen. The degree of acquired spinal stenosis at L4-5 has not appreciably changed since 09/01/2022. 4. Interval L4 kyphoplasty with further fragmentation of the L4 inferior endplate since the March studies. 5. Osteopenia and degenerative change with no displaced pelvic, sacral or proximal femoral fractures. 6. Constipation and diverticulosis.  Postsurgical changes.  CT femur:  1. Acute nondisplaced transverse fracture across the fibular head and neck junction. 2. Osteopenia and degenerative change. 3. No displaced fracture of the right femur or visualized proximal tibia. 4. Small low-density suprapatellar bursal effusion. No fat-fluid level or hemarthrosis.  I have personally reviewed and interpreted this imaging and agree with radiology interpretation.  Discussed these findings with the patient, given she lives at home alone and ambulates with a walker at baseline and is requiring IV pain control, feel patient would benefit from [facility] for pain control and PT/OT consult. Discussed patient with orthopedic surgery Dr. Shon Baton who will see the patient  in consultation tomorrow. Labs ordered for admission. Also, given patient did sustain a fall on Eliquis, CT head and neck ordered as well.  Laboratory evaluation is resulted and reveals no acute laboratory changes from baseline.  CT head and neck resulted and reveal no acute findings.  I personally reviewed and interpreted these images and agree with radiology interpretation.  Discussed patient with hospitalist who accepts for admission.    Silva Bandy, PA-C 01/09/23 [id number]    Pricilla Loveless, MD 01/12/23 [phone]

## 2023-01-08 NOTE — ED Notes (Addendum)
Ortho tech was called and states she will be here shortly.

## 2023-01-08 NOTE — ED Triage Notes (Signed)
PER EMS: pt slide out of her chair when attempting to get up to the bathroom today. She landed on her RIGHT knee and hip, reports pain to that knee and hip. No obvious deformity. She takes Eliquis, unsure if she hit her head, she doesn't think she did. A&OX4, GCS 15.  BP- 158/86, HR-70, 94% RA

## 2023-01-09 ENCOUNTER — Encounter (HOSPITAL_COMMUNITY): Payer: Self-pay | Admitting: Internal Medicine

## 2023-01-09 ENCOUNTER — Other Ambulatory Visit: Payer: Self-pay

## 2023-01-09 DIAGNOSIS — S82831A Other fracture of upper and lower end of right fibula, initial encounter for closed fracture: Secondary | ICD-10-CM | POA: Diagnosis not present

## 2023-01-09 DIAGNOSIS — I4819 Other persistent atrial fibrillation: Secondary | ICD-10-CM | POA: Diagnosis not present

## 2023-01-09 DIAGNOSIS — E871 Hypo-osmolality and hyponatremia: Secondary | ICD-10-CM | POA: Diagnosis not present

## 2023-01-09 DIAGNOSIS — D638 Anemia in other chronic diseases classified elsewhere: Secondary | ICD-10-CM | POA: Diagnosis present

## 2023-01-09 DIAGNOSIS — W19XXXA Unspecified fall, initial encounter: Secondary | ICD-10-CM | POA: Insufficient documentation

## 2023-01-09 DIAGNOSIS — E559 Vitamin D deficiency, unspecified: Secondary | ICD-10-CM

## 2023-01-09 DIAGNOSIS — F411 Generalized anxiety disorder: Secondary | ICD-10-CM | POA: Diagnosis present

## 2023-01-09 DIAGNOSIS — S32050A Wedge compression fracture of fifth lumbar vertebra, initial encounter for closed fracture: Secondary | ICD-10-CM | POA: Diagnosis not present

## 2023-01-09 DIAGNOSIS — S82401A Unspecified fracture of shaft of right fibula, initial encounter for closed fracture: Secondary | ICD-10-CM | POA: Diagnosis present

## 2023-01-09 DIAGNOSIS — S82451P Displaced comminuted fracture of shaft of right fibula, subsequent encounter for closed fracture with malunion: Secondary | ICD-10-CM | POA: Diagnosis present

## 2023-01-09 LAB — CBC WITH DIFFERENTIAL/PLATELET
Abs Immature Granulocytes: 0.04 10*3/uL (ref 0.00–0.07)
Basophils Absolute: 0 10*3/uL (ref 0.0–0.1)
Basophils Relative: 0 %
Eosinophils Absolute: 0.1 10*3/uL (ref 0.0–0.5)
Eosinophils Relative: 1 %
HCT: 27.1 % — ABNORMAL LOW (ref 36.0–46.0)
Hemoglobin: 8.7 g/dL — ABNORMAL LOW (ref 12.0–15.0)
Immature Granulocytes: 1 %
Lymphocytes Relative: 19 %
Lymphs Abs: 1.3 10*3/uL (ref 0.7–4.0)
MCH: 31 pg (ref 26.0–34.0)
MCHC: 32.1 g/dL (ref 30.0–36.0)
MCV: 96.4 fL (ref 80.0–100.0)
Monocytes Absolute: 0.8 10*3/uL (ref 0.1–1.0)
Monocytes Relative: 12 %
Neutro Abs: 4.6 10*3/uL (ref 1.7–7.7)
Neutrophils Relative %: 67 %
Platelets: 206 10*3/uL (ref 150–400)
RBC: 2.81 MIL/uL — ABNORMAL LOW (ref 3.87–5.11)
RDW: 14.3 % (ref 11.5–15.5)
WBC: 6.9 10*3/uL (ref 4.0–10.5)
nRBC: 0 % (ref 0.0–0.2)

## 2023-01-09 LAB — COMPREHENSIVE METABOLIC PANEL
ALT: 26 U/L (ref 0–44)
AST: 22 U/L (ref 15–41)
Albumin: 3 g/dL — ABNORMAL LOW (ref 3.5–5.0)
Alkaline Phosphatase: 68 U/L (ref 38–126)
Anion gap: 7 (ref 5–15)
BUN: 36 mg/dL — ABNORMAL HIGH (ref 8–23)
CO2: 31 mmol/L (ref 22–32)
Calcium: 8.7 mg/dL — ABNORMAL LOW (ref 8.9–10.3)
Chloride: 94 mmol/L — ABNORMAL LOW (ref 98–111)
Creatinine, Ser: 1.13 mg/dL — ABNORMAL HIGH (ref 0.44–1.00)
GFR, Estimated: 47 mL/min — ABNORMAL LOW (ref >60)
Glucose, Bld: 122 mg/dL — ABNORMAL HIGH (ref 70–99)
Potassium: 4.3 mmol/L (ref 3.5–5.1)
Sodium: 132 mmol/L — ABNORMAL LOW (ref 135–145)
Total Bilirubin: 0.7 mg/dL (ref 0.3–1.2)
Total Protein: 5.9 g/dL — ABNORMAL LOW (ref 6.5–8.1)

## 2023-01-09 LAB — URINALYSIS, COMPLETE (UACMP) WITH MICROSCOPIC
Bacteria, UA: NONE SEEN
Bilirubin Urine: NEGATIVE
Glucose, UA: NEGATIVE mg/dL
Ketones, ur: NEGATIVE mg/dL
Leukocytes,Ua: NEGATIVE
Nitrite: NEGATIVE
Protein, ur: NEGATIVE mg/dL
Specific Gravity, Urine: 1.012 (ref 1.005–1.030)
pH: 5 (ref 5.0–8.0)

## 2023-01-09 LAB — MAGNESIUM
Magnesium: 2.8 mg/dL — ABNORMAL HIGH (ref 1.7–2.4)
Magnesium: 2.8 mg/dL — ABNORMAL HIGH (ref 1.7–2.4)

## 2023-01-09 LAB — VITAMIN D 25 HYDROXY (VIT D DEFICIENCY, FRACTURES): Vit D, 25-Hydroxy: 26.24 ng/mL — ABNORMAL LOW (ref 30–100)

## 2023-01-09 MED ORDER — ALUM & MAG HYDROXIDE-SIMETH 200-200-20 MG/5ML PO SUSP
30.0000 mL | Freq: Four times a day (QID) | ORAL | Status: DC | PRN
  Administered 2023-01-09 – 2023-01-11 (×4): 30 mL via ORAL
  Filled 2023-01-09 (×4): qty 30

## 2023-01-09 MED ORDER — APIXABAN 5 MG PO TABS
5.0000 mg | ORAL_TABLET | Freq: Two times a day (BID) | ORAL | Status: DC
  Administered 2023-01-09 – 2023-01-11 (×5): 5 mg via ORAL
  Filled 2023-01-09 (×5): qty 1

## 2023-01-09 MED ORDER — VITAMIN D (ERGOCALCIFEROL) 1.25 MG (50000 UNIT) PO CAPS
50000.0000 [IU] | ORAL_CAPSULE | ORAL | Status: DC
  Administered 2023-01-10 – 2023-01-17 (×2): 50000 [IU] via ORAL
  Filled 2023-01-09 (×2): qty 1

## 2023-01-09 MED ORDER — CLONAZEPAM 0.5 MG PO TABS
0.5000 mg | ORAL_TABLET | Freq: Two times a day (BID) | ORAL | Status: DC | PRN
  Administered 2023-01-09 – 2023-01-17 (×12): 0.5 mg via ORAL
  Filled 2023-01-09 (×12): qty 1

## 2023-01-09 MED ORDER — MOMETASONE FURO-FORMOTEROL FUM 100-5 MCG/ACT IN AERO
2.0000 | INHALATION_SPRAY | Freq: Two times a day (BID) | RESPIRATORY_TRACT | Status: DC
  Administered 2023-01-09 – 2023-01-17 (×16): 2 via RESPIRATORY_TRACT
  Filled 2023-01-09: qty 8.8

## 2023-01-09 MED ORDER — OXYCODONE-ACETAMINOPHEN 5-325 MG PO TABS
1.0000 | ORAL_TABLET | ORAL | Status: DC | PRN
  Administered 2023-01-09 – 2023-01-13 (×12): 1 via ORAL
  Filled 2023-01-09 (×12): qty 1

## 2023-01-09 MED ORDER — MORPHINE SULFATE (PF) 4 MG/ML IV SOLN
4.0000 mg | INTRAVENOUS | Status: DC | PRN
  Administered 2023-01-09 – 2023-01-12 (×3): 4 mg via INTRAVENOUS
  Filled 2023-01-09 (×3): qty 1

## 2023-01-09 MED ORDER — CLONIDINE HCL 0.1 MG PO TABS
0.2000 mg | ORAL_TABLET | Freq: Two times a day (BID) | ORAL | Status: DC
  Administered 2023-01-09 – 2023-01-17 (×17): 0.2 mg via ORAL
  Filled 2023-01-09 (×18): qty 2

## 2023-01-09 MED ORDER — DOCUSATE SODIUM 100 MG PO CAPS
100.0000 mg | ORAL_CAPSULE | Freq: Two times a day (BID) | ORAL | Status: DC
  Administered 2023-01-09 – 2023-01-17 (×17): 100 mg via ORAL
  Filled 2023-01-09 (×18): qty 1

## 2023-01-09 MED ORDER — METHOCARBAMOL 500 MG PO TABS
500.0000 mg | ORAL_TABLET | Freq: Four times a day (QID) | ORAL | Status: DC | PRN
  Administered 2023-01-09 – 2023-01-16 (×10): 500 mg via ORAL
  Filled 2023-01-09 (×10): qty 1

## 2023-01-09 MED ORDER — FERROUS SULFATE 325 (65 FE) MG PO TABS
325.0000 mg | ORAL_TABLET | Freq: Every day | ORAL | Status: DC
  Administered 2023-01-09 – 2023-01-12 (×4): 325 mg via ORAL
  Filled 2023-01-09 (×4): qty 1

## 2023-01-09 MED ORDER — TORSEMIDE 20 MG PO TABS
40.0000 mg | ORAL_TABLET | Freq: Two times a day (BID) | ORAL | Status: DC
  Administered 2023-01-09 – 2023-01-11 (×5): 40 mg via ORAL
  Filled 2023-01-09 (×5): qty 2

## 2023-01-09 MED ORDER — ACETAMINOPHEN 325 MG PO TABS
650.0000 mg | ORAL_TABLET | Freq: Two times a day (BID) | ORAL | Status: DC
  Administered 2023-01-09 – 2023-01-17 (×17): 650 mg via ORAL
  Filled 2023-01-09 (×17): qty 2

## 2023-01-09 NOTE — Assessment & Plan Note (Signed)
No clinical evidence of cardiogenic volume overload Strict input and output monitoring Daily weights Low-sodium diet  

## 2023-01-09 NOTE — Assessment & Plan Note (Signed)
Reportedly "chronic" hyponatremia however baseline sodium after chart review reveals baseline sodium in the low 130s Sodium is currently lower than baseline, with concurrent hypochloremia suggesting volume depletion  Etiology is likely due to patient being on too much torsemide will hold torsemide for 24 hours, provide patient with some gentle intravenous hydration and when torsemide is resumed that should likely be reduced to once daily instead of twice daily. Furthermore I cannot find any evidence of a prior appropriate hyponatremia workup.  Obtaining urine sodium, urine osmolality, serum osmolality and cortisol

## 2023-01-09 NOTE — Assessment & Plan Note (Signed)
Conservative management as this is not weightbearing Knee immobilizer per orthopedic surgery recommendations which have been ordered

## 2023-01-09 NOTE — Hospital Course (Signed)
88 year old female with past medical history of coronary artery disease (cath 12/2020 with nonobstructive CAD), L3 and L4 kyphoplasty (11/2022), mild/moderate aortic stenosis (AVA 1.42cm2 via Echo 03/2022), chronic hyponatremia, permanent atrial fibrillation on Eliquis, diastolic congestive heart failure with Takotsubo cardiomyopathy in the past, hypertension, anemia of chronic disease presenting to [facility] emergency department with complaints of fall after tripping and low back pain.  Upon evaluation in the emergency department trauma evaluation included a CTA scan of the right femur revealing an acute nondisplaced fracture across the fibular head and neck junction along with CT imaging of the pelvis identifying an L5 superior endplate central compression fracture with slight posterior superior retropulsion and fragmentation.  EDP discussed case with Dr. Shon Baton with orthopedic surgery who agreed to consult.  The hospitalist group was then called to assess the patient for admission to the hospital.  Dr. Shon Baton recommended TLSO bracing and conservative measures for management of the compression fracture.  They additionally felt that the right fibular head fracture likely also did not require surgical intervention.    Hospital course was complicated by development of hyperkalemia on hospital day 3 requiring gentle hydration, multiple doses of Lokelma as well as dextrose and insulin.  Hospital course was also complicated by development of progressive anemia, prompting intravenous Protonix and GI consultation.

## 2023-01-09 NOTE — Assessment & Plan Note (Addendum)
Improving pain on current analgesic regimen including twice daily Tylenol, intranasal calcitonin and as needed opiate-based analgesics TLSO brace has been ordered per orthopedic surgery recommendations MRI lumbar spine ordered and pending per orthopedic surgery Vitamin D found to be deficient, initiated on ergocalciferol 50,000 units q. weekly. PT evaluation performed, recommending skilled physical therapy services in a skilled nursing facility

## 2023-01-09 NOTE — Plan of Care (Signed)
  Problem: Coping: Goal: Level of anxiety will decrease Outcome: Progressing   Problem: Pain Managment: Goal: General experience of comfort will improve Outcome: Progressing   Problem: Safety: Goal: Ability to remain free from injury will improve Outcome: Progressing   

## 2023-01-09 NOTE — Assessment & Plan Note (Addendum)
Low vitamin D of 26.24 on this hospitalization despite outpatient supplementation Initiating ergocalciferol 50,000 units q. weekly.

## 2023-01-09 NOTE — Assessment & Plan Note (Signed)
Continue home regimen of Eliquis Rate controlled

## 2023-01-09 NOTE — Progress Notes (Signed)
PROGRESS NOTE   [name]  GMW:[id number] DOB: 1934-12-12 DOA: 01/08/2023 PCP: Donita Brooks, MD   Date of Service: the patient was seen and examined on 01/09/2023  Brief Narrative:  88 year old female with past medical history of chronic hyponatremia, permanent atrial fibrillation on Eliquis, diastolic congestive heart failure, hypertension, anemia of chronic disease presenting to [facility] emergency department with complaints of fall after tripping and low back pain.  Upon evaluation in the emergency department trauma evaluation included a CTA scan of the right femur revealing an acute nondisplaced fracture across the fibular head and neck junction along with CT imaging of the pelvis identifying an L5 superior endplate central compression fracture with slight posterior superior retropulsion and fragmentation.  EDP discussed case with Dr. Shon Baton with orthopedic surgery who recommended TLSO bracing and conservative measures for management of the compression fracture.  They additionally felt that the right fibular head fracture likely also did not require surgical intervention but agreed to consult.  The hospitalist group was then called to assess the patient for admission to the hospital.   Assessment and Plan: * Closed compression fracture of L5 lumbar vertebra, initial encounter (HCC) Titrating opiate-based analgesics to achieve adequate pain control Additionally providing patient with scheduled Tylenol twice daily Intranasal calcitonin additionally ordered Per orthopedics, patient is unlikely to benefit from surgical intervention and a TLSO brace is instead recommended Vitamin D level ordered PT evaluation  Closed right fibular fracture Conservative management as this is not weightbearing Knee immobilizer per orthopedic surgery recommendations which have been ordered  Persistent atrial fibrillation (HCC) Continue home regimen of Eliquis Rate  controlled  Hyponatremia Chronic Monitoring sodium levels with serial chemistries  Chronic diastolic CHF (congestive heart failure) (HCC) No clinical evidence of cardiogenic volume overload Strict input and output monitoring Daily weights Low-sodium diet   Essential hypertension Continue home regimen of clonidine  Vitamin D deficiency Known history of vitamin D deficiency Reassessing with repeat vitamin D level Will then titrate vitamin D supplementation    Subjective:  Patient complaining of low back pain, moderate intensity, severe when ambulating or weightbearing.  Patient is also complaining of left ankle pain however she reports that this has been ongoing for at least the past several months.  Physical Exam:  Vitals:   01/09/23 0913 01/09/23 1144 01/09/23 1422 01/09/23 1950  BP:  (!) 167/49 118/64 (!) 154/49  Pulse:  63 (!) 51 (!) 54  Resp:  16 16 18   Temp:   98.2 F (36.8 C) 97.6 F (36.4 C)  TempSrc:   Oral Oral  SpO2: 93% 96% 94% 97%  Weight:      Height:        Constitutional: Awake alert and oriented x3, no associated distress.   Skin: no rashes, no lesions, good skin turgor noted. Eyes: Pupils are equally reactive to light.  No evidence of scleral icterus or conjunctival pallor.  ENMT: Moist mucous membranes noted.  Posterior pharynx clear of any exudate or lesions.   Respiratory: clear to auscultation bilaterally, no wheezing, no crackles. Normal respiratory effort. No accessory muscle use.  Cardiovascular: Regular rate and rhythm, no murmurs / rubs / gallops. No extremity edema. 2+ pedal pulses. No carotid bruits.  Abdomen: Abdomen is soft and nontender.  No evidence of intra-abdominal masses.  Positive bowel sounds noted in all quadrants.   Musculoskeletal: Pain with both passive and active range of motion of the left ankle Back: Midline lumbar tenderness on palpation without crepitus   Data  Reviewed:  I have personally reviewed and interpreted  labs, imaging.  Significant findings are   CBC: Recent Labs  Lab 01/08/23 2233 01/09/23 0325  WBC 8.1 6.9  NEUTROABS  --  4.6  HGB 9.4* 8.7*  HCT 29.2* 27.1*  MCV 95.7 96.4  PLT 233 206   Basic Metabolic Panel: Recent Labs  Lab 01/04/23 1413 01/07/23 1524 01/08/23 2233 01/09/23 0325 01/09/23 0326  NA 125* 132* 131* 132*  --   K 5.5* 4.8 4.3 4.3  --   CL 87* 91* 92* 94*  --   CO2 27 29 30 31   --   GLUCOSE 145* 143* 154* 122*  --   BUN 47* 33* 35* 36*  --   CREATININE 1.27* 1.12* 1.13* 1.13*  --   CALCIUM 9.4 9.7 8.7* 8.7*  --   MG  --   --   --  2.8* 2.8*   GFR: Estimated Creatinine Clearance: 35.8 mL/min (A) (by C-G formula based on SCr of 1.13 mg/dL (H)). Liver Function Tests: Recent Labs  Lab 01/09/23 0325  AST 22  ALT 26  ALKPHOS 68  BILITOT 0.7  PROT 5.9*  ALBUMIN 3.0*     Code Status:  Full code.  Code status decision has been confirmed with: patient Family Communication: Son is at the bedside who has been updated on plan of care   Severity of Illness:  The appropriate patient status for this patient is OBSERVATION. Observation status is judged to be reasonable and necessary in order to provide the required intensity of service to ensure the patient's safety. The patient's presenting symptoms, physical exam findings, and initial radiographic and laboratory data in the context of their medical condition is felt to place them at decreased risk for further clinical deterioration. Furthermore, it is anticipated that the patient will be medically stable for discharge from the hospital within 2 midnights of admission.   Time spent:  50 minutes  Author:  Marinda Elk MD  01/09/2023 11:29 PM

## 2023-01-09 NOTE — Evaluation (Signed)
Physical Therapy Evaluation Patient Details Name: [name] MRN: [id number] DOB: 1934/10/08 Today's Date: 01/09/2023  History of Present Illness  Pt admitted from home s/p fall with L-5 compression fx and R fibular head fx (both nonoperative).  Pt with hx of HOH, CHF, colon CA, breast CA, CAD, COPD, CVA, afib, recent L3 and L4 compression fx with balloon kyphoplasty  Clinical Impression  Pt admitted as above and presenting with functional mobility limitations 2* generalized weekend, pain, balance deficits, and limited endurance.  This date, pt limited by pain to transfers bed to [facility] to recliner.  Pt will benefit from continued acute care PT and follow up PT at appropriate venue.     Recommendations for follow up therapy are one component of a multi-disciplinary discharge planning process, led by the attending physician.  Recommendations [place] be updated based on patient status, additional functional criteria and insurance authorization.  Follow Up Recommendations Can patient physically be transported by private vehicle: No     Assistance Recommended at Discharge Intermittent Supervision/Assistance  Patient can return home with the following  A lot of help with walking and/or transfers;A lot of help with bathing/dressing/bathroom;Assistance with cooking/housework;Assist for transportation;Help with stairs or ramp for entrance    Equipment Recommendations None recommended by PT  Recommendations for Other Services       Functional Status Assessment Patient has had a recent decline in their functional status and demonstrates the ability to make significant improvements in function in a reasonable and predictable amount of time.     Precautions / Restrictions Precautions Precautions: Fall Required Braces or Orthoses: Knee Immobilizer - Right;Spinal Brace Knee Immobilizer - Right: On when out of bed or walking Spinal Brace: Other (comment) (LSO ordered for OOB) Splint/Cast - Date Prophylactic  Dressing Applied (if applicable): 01/08/23 Restrictions Weight Bearing Restrictions: No Other Position/Activity Restrictions: WBAT      Mobility  Bed Mobility Overal bed mobility: Needs Assistance Bed Mobility: Rolling, Sidelying to Sit Rolling: Min assist Sidelying to sit: Min assist, Mod assist       General bed mobility comments: cues for technique and assist to bring trunk to upright and to bring LEs over EOB    Transfers Overall transfer level: Needs assistance Equipment used: Rolling walker (2 wheels) Transfers: Sit to/from Stand, Bed to chair/wheelchair/BSC Sit to Stand: Mod assist, From elevated surface   Step pivot transfers: Min assist, Mod assist, +2 safety/equipment       General transfer comment: cues for LE management and use of UEs to self assist.  Physical assist to bring wt up and fwd and to balance in standing  with RW; step pvt bed to [facility] to recliner    Ambulation/Gait               General Gait Details: Pain limited to transfers bed to [facility] to recliner  Stairs            Wheelchair Mobility    Modified Rankin (Stroke Patients Only)       Balance Overall balance assessment: Needs assistance Sitting-balance support: Feet supported, Bilateral upper extremity supported Sitting balance-Leahy Scale: Fair     Standing balance support: Bilateral upper extremity supported Standing balance-Leahy Scale: Poor                               Pertinent Vitals/Pain Pain Assessment Pain Assessment: 0-10 Pain Score: 7  Pain Location: back and R LE Pain Descriptors /  Indicators: Aching, Guarding, Grimacing, Sore Pain Intervention(s): Limited activity within patient's tolerance, Premedicated before session, Monitored during session    Home Living Family/patient expects to be discharged to:: Unsure Living Arrangements: Alone                 Additional Comments: Son lives next door    Prior Function Prior Level of  Function : Independent/Modified Independent             Mobility Comments: using RW since prior compression fx       Hand Dominance   Dominant Hand: Right    Extremity/Trunk Assessment   Upper Extremity Assessment Upper Extremity Assessment: Generalized weakness    Lower Extremity Assessment Lower Extremity Assessment: Generalized weakness;RLE deficits/detail RLE Deficits / Details: Spint in place but with new order for KI    Cervical / Trunk Assessment Cervical / Trunk Assessment: Kyphotic  Communication   Communication: HOH  Cognition Arousal/Alertness: Awake/alert Behavior During Therapy: WFL for tasks assessed/performed Overall Cognitive Status: Within Functional Limits for tasks assessed                                          General Comments      Exercises     Assessment/Plan    PT Assessment Patient needs continued PT services  PT Problem List Decreased strength;Decreased range of motion;Decreased activity tolerance;Decreased balance;Decreased mobility;Decreased safety awareness;Decreased knowledge of use of DME;Pain       PT Treatment Interventions DME instruction;Gait training;Functional mobility training;Therapeutic activities;Therapeutic exercise;Balance training;Patient/family education    PT Goals (Current goals can be found in the Care Plan section)  Acute Rehab PT Goals Patient Stated Goal: Less pain, regain IND PT Goal Formulation: With patient Time For Goal Achievement: 01/23/23 Potential to Achieve Goals: Good    Frequency Min 1X/week     Co-evaluation               AM-PAC PT "6 Clicks" Mobility  Outcome Measure Help needed turning from your back to your side while in a flat bed without using bedrails?: A Lot Help needed moving from lying on your back to sitting on the side of a flat bed without using bedrails?: A Lot Help needed moving to and from a bed to a chair (including a wheelchair)?: A Lot Help  needed standing up from a chair using your arms (e.g., wheelchair or bedside chair)?: A Lot Help needed to walk in hospital room?: Total Help needed climbing 3-5 steps with a railing? : Total 6 Click Score: 10    End of Session Equipment Utilized During Treatment: Gait belt Activity Tolerance: Patient limited by pain;Patient limited by fatigue Patient left: in chair;with call bell/phone within reach;with chair alarm set;with family/visitor present Nurse Communication: Mobility status PT Visit Diagnosis: Unsteadiness on feet (R26.81);Muscle weakness (generalized) (M62.81);Difficulty in walking, not elsewhere classified (R26.2);Pain    Time: 0981-1914 PT Time Calculation (min) (ACUTE ONLY): 22 min   Charges:   PT Evaluation $PT Eval Low Complexity: 1 Low          [name] PT Acute Rehabilitation Services Pager [phone] Office [phone]   [name] 01/09/2023, 10:56 AM

## 2023-01-09 NOTE — Consult Note (Signed)
Reason for Consult: She is an 88 year old female who had a fall from ground-level.  This was a trip.  Also struck her knee.  Presented complaining of knee pain and low back pain.  She was admitted for a compression fracture lumbar spine and fibular fracture of her knee Referring Physician: EDP  [name] is an 88 y.o. female.  HPI: Patient is an 88 year old female had a fall from ground-level at home.  Having back pain and a compression fracture as well as a fibular head fracture.  She was admitted for inability to ambulate.  No numbness tingling lower extremities.  No fevers or chills.  As well as pain management  Past Medical History:  Diagnosis Date   Allergy    rhinitis   Aortic stenosis    mild by echo 06/2017   Arthritis    Bradycardia    a. 10/2017 -> beta blocker cut back due to HR 39.   Breast cancer (HCC) 01/06/2012   Cancer (HCC)    right colon and left breast   Chronic diastolic CHF (congestive heart failure) (HCC)    Colon cancer (HCC) 01/06/2012   Colovesical fistula    Dr. Carolynne Edouard and Dr. Laverle Patter planning surgery 08/2018- surgery revealed spontaneous closure   COPD (chronic obstructive pulmonary disease) (HCC)    pt. denies   Coronary artery disease 2006   a.  NSTEMI in 2016, cath showed 15% prox-mid RCA, 20% prox LAD, EF 25-35% by cath and 35-40% -> felt due to Takotsubo cardiomyopathy.   Dilated aortic root (HCC)    by echo 06/2017   Diverticulosis    Dyspnea    Edema extremities    GERD (gastroesophageal reflux disease)    Hernia    Hiatal hernia    denies   Hyperlipidemia    Hypertension    Mild aortic stenosis    echo 11/2015 but not noted on echo 06/2016   Osteopenia    Permanent atrial fibrillation (HCC)    chronic atrial fibrillation   Pneumonia    hx child   Pulmonary HTN (HCC)    a. moderate to severe PASP echo 11/2015 - now by echo 06/2017. CTA chest in 11/16 with no PE. PFTs in 7/15 with mild obstructive lung disease. She had a  negative sleep study in 2017. b. Felt due to left sided HF.   Stroke [facility])    Takotsubo syndrome 07/29/2015   a. EF 35-40% by echo; akinesis of mid-apical anteroseptal and apical myocardium.  EF now normalized on echo 11/2015    Past Surgical History:  Procedure Laterality Date   APPENDECTOMY     BREAST SURGERY     lumpectomy left   CARDIAC CATHETERIZATION     CARDIAC CATHETERIZATION N/A 07/28/2015   Procedure: Left Heart Cath and Coronary Angiography;  Surgeon: Peter M Swaziland, MD;  Location: [facility] INVASIVE CV LAB;  Service: Cardiovascular;  Laterality: N/A;   CHOLECYSTECTOMY     COLECTOMY     right side   CYSTOSCOPY WITH STENT PLACEMENT Left 09/04/2018   Procedure: CYSTOSCOPY WITH LEFT STENT PLACEMENT, BLADDER REPAIR, CYSTOSCOPY WITH LEFT STENT REMOVAL;  Surgeon: Heloise Purpura, MD;  Location: [facility] ORS;  Service: Urology;  Laterality: Left;   EXCISION OF ACCESSORY NIPPLE Bilateral 05/30/2013   Procedure: BILATERAL NIPPLE BIOPSY;  Surgeon: Robyne Askew, MD;  Location: [facility] OR;  Service: General;  Laterality: Bilateral;   EYE SURGERY Bilateral 12   cataracts   HYSTEROSCOPY WITH D &  C N/A 05/29/2020   Procedure: DILATATION AND CURETTAGE /HYSTEROSCOPY;  Surgeon: Natale Milch, MD;  Location: [facility] ORS;  Service: Gynecology;  Laterality: N/A;   IR KYPHO EA ADDL LEVEL THORACIC OR LUMBAR  11/22/2022   IR KYPHO LUMBAR INC FX REDUCE BONE BX UNI/BIL CANNULATION INC/IMAGING  11/22/2022   IR RADIOLOGIST EVAL & MGMT  07/26/2017   LAPAROSCOPIC RIGHT COLECTOMY N/A 09/04/2018   Procedure: LAPAROSCOPIC ASSISTED SIGMOID COLECTOMY WITH REPAIR OF FISTULA TO BLADDER;  Surgeon: Griselda Miner, MD;  Location: [facility] ORS;  Service: General;  Laterality: N/A;   RIGHT/LEFT HEART CATH AND CORONARY ANGIOGRAPHY N/A 12/10/2020   Procedure: RIGHT/LEFT HEART CATH AND CORONARY ANGIOGRAPHY;  Surgeon: Laurey Morale, MD;  Location: [facility] INVASIVE CV LAB;  Service: Cardiovascular;  Laterality: N/A;   SPLIT NIGHT STUDY   02/02/2016        Family History  Problem Relation Age of Onset   Heart disease Mother    Heart attack Mother    Cancer Sister        stomach and colon   Heart disease Brother 31   Hypertension Father    Cancer Sister    Stroke Neg Hx     Social History:  reports that she has never smoked. She has never been exposed to tobacco smoke. She has never used smokeless tobacco. She reports that she does not drink alcohol and does not use drugs.  Allergies:  Allergies  Allergen Reactions   Contrast Media [Iodinated Contrast Media] Hives and Other (See Comments)    Chest pain and burning    Clonidine Derivatives Other (See Comments)    Dry mouth, throat   Statins Other (See Comments)    Myalgias   Sulfa Antibiotics Diarrhea    Tremors    Celebrex [Celecoxib] Rash   Isordil [Isosorbide Nitrate] Itching and Rash   Tape Itching and Rash    Redness at application site Reaction is to paper and plastic tapes and EKG leads    Medications: I have reviewed the patient's current medications.  Results for orders placed or performed during the hospital encounter of 01/08/23 (from the past 48 hour(s))  CBC     Status: Abnormal   Collection Time: 01/08/23 10:33 PM  Result Value Ref Range   WBC 8.1 4.0 - 10.5 K/uL   RBC 3.05 (L) 3.87 - 5.11 MIL/uL   Hemoglobin 9.4 (L) 12.0 - 15.0 g/dL   HCT 10.2 (L) 72.5 - 36.6 %   MCV 95.7 80.0 - 100.0 fL   MCH 30.8 26.0 - 34.0 pg   MCHC 32.2 30.0 - 36.0 g/dL   RDW 44.0 34.7 - 42.5 %   Platelets 233 150 - 400 K/uL   nRBC 0.0 0.0 - 0.2 %    Comment: Performed at [facility], [address] [place], [place] [address]  Basic metabolic panel     Status: Abnormal   Collection Time: 01/08/23 10:33 PM  Result Value Ref Range   Sodium 131 (L) 135 - 145 mmol/L   Potassium 4.3 3.5 - 5.1 mmol/L   Chloride 92 (L) 98 - 111 mmol/L   CO2 30 22 - 32 mmol/L   Glucose, Bld 154 (H) 70 - 99 mg/dL    Comment: Glucose reference range applies only  to samples taken after fasting for at least 8 hours.   BUN 35 (H) 8 - 23 mg/dL   Creatinine, Ser 7.56 (H) 0.44 - 1.00 mg/dL   Calcium 8.7 (L)  8.9 - 10.3 mg/dL   GFR, Estimated 47 (L) >60 mL/min    Comment: (NOTE) Calculated using the CKD-EPI Creatinine Equation (2021)    Anion gap 9 5 - 15    Comment: Performed at [facility], [address], [place], [place] [address]  CBC with Differential/Platelet     Status: Abnormal   Collection Time: 01/09/23  3:25 AM  Result Value Ref Range   WBC 6.9 4.0 - 10.5 K/uL   RBC 2.81 (L) 3.87 - 5.11 MIL/uL   Hemoglobin 8.7 (L) 12.0 - 15.0 g/dL   HCT 91.4 (L) 78.2 - 95.6 %   MCV 96.4 80.0 - 100.0 fL   MCH 31.0 26.0 - 34.0 pg   MCHC 32.1 30.0 - 36.0 g/dL   RDW 21.3 08.6 - 57.8 %   Platelets 206 150 - 400 K/uL   nRBC 0.0 0.0 - 0.2 %   Neutrophils Relative % 67 %   Neutro Abs 4.6 1.7 - 7.7 K/uL   Lymphocytes Relative 19 %   Lymphs Abs 1.3 0.7 - 4.0 K/uL   Monocytes Relative 12 %   Monocytes Absolute 0.8 0.1 - 1.0 K/uL   Eosinophils Relative 1 %   Eosinophils Absolute 0.1 0.0 - 0.5 K/uL   Basophils Relative 0 %   Basophils Absolute 0.0 0.0 - 0.1 K/uL   Immature Granulocytes 1 %   Abs Immature Granulocytes 0.04 0.00 - 0.07 K/uL    Comment: Performed at [facility], [address] [place], [place] [address]  Comprehensive metabolic panel     Status: Abnormal   Collection Time: 01/09/23  3:25 AM  Result Value Ref Range   Sodium 132 (L) 135 - 145 mmol/L   Potassium 4.3 3.5 - 5.1 mmol/L   Chloride 94 (L) 98 - 111 mmol/L   CO2 31 22 - 32 mmol/L   Glucose, Bld 122 (H) 70 - 99 mg/dL    Comment: Glucose reference range applies only to samples taken after fasting for at least 8 hours.   BUN 36 (H) 8 - 23 mg/dL   Creatinine, Ser 9.52 (H) 0.44 - 1.00 mg/dL   Calcium 8.7 (L) 8.9 - 10.3 mg/dL   Total Protein 5.9 (L) 6.5 - 8.1 g/dL   Albumin 3.0 (L) 3.5 - 5.0 g/dL   AST 22 15 - 41 U/L   ALT 26 0 - 44 U/L    Alkaline Phosphatase 68 38 - 126 U/L   Total Bilirubin 0.7 0.3 - 1.2 mg/dL   GFR, Estimated 47 (L) >60 mL/min    Comment: (NOTE) Calculated using the CKD-EPI Creatinine Equation (2021)    Anion gap 7 5 - 15    Comment: Performed at [facility], [address], [place], [place] [address]  Magnesium     Status: Abnormal   Collection Time: 01/09/23  3:25 AM  Result Value Ref Range   Magnesium 2.8 (H) 1.7 - 2.4 mg/dL    Comment: Performed at [facility], [address], [place], [place] [address]  Magnesium     Status: Abnormal   Collection Time: 01/09/23  3:26 AM  Result Value Ref Range   Magnesium 2.8 (H) 1.7 - 2.4 mg/dL    Comment: Performed at [facility], [address] [place], [place] [address]   *Note: Due to a large number of results and/or encounters for the requested time period, some results have not been displayed. A complete set of  results can be found in Results Review.    CT Head Wo Contrast  Result Date: 01/08/2023 CLINICAL DATA:  Head trauma, minor (Age >= 65y); Neck trauma (Age >= 65y) EXAM: CT HEAD WITHOUT CONTRAST CT CERVICAL SPINE WITHOUT CONTRAST TECHNIQUE: Multidetector CT imaging of the head and cervical spine was performed following the standard protocol without intravenous contrast. Multiplanar CT image reconstructions of the cervical spine were also generated. RADIATION DOSE REDUCTION: This exam was performed according to the departmental dose-optimization program which includes automated exposure control, adjustment of the mA and/or kV according to patient size and/or use of iterative reconstruction technique. COMPARISON:  None Available. FINDINGS: CT HEAD FINDINGS Brain: Normal anatomic configuration. Parenchymal volume loss is commensurate with the patient's age. Mild periventricular white matter changes are present likely reflecting the sequela of small vessel ischemia. Remote lacunar infarct within the  left thalamus. No abnormal intra or extra-axial mass lesion or fluid collection. No abnormal mass effect or midline shift. No evidence of acute intracranial hemorrhage or infarct. Stable borderline ventriculomegaly which appears commensurate with the degree of parenchymal volume loss and likely represents central atrophy. Cerebellum unremarkable. Vascular: No asymmetric hyperdense vasculature at the skull base. Skull: Intact Sinuses/Orbits: Paranasal sinuses are clear. Orbits are unremarkable. Other: Mastoid air cells and middle ear cavities are clear. CT CERVICAL SPINE FINDINGS Alignment: Normal. Skull base and vertebrae: No acute fracture. No primary bone lesion or focal pathologic process. Soft tissues and spinal canal: No prevertebral fluid or swelling. No visible canal hematoma. Disc levels: Intervertebral disc space narrowing endplate remodeling noted in keeping with changes of diffuse mild to moderate degenerative disc disease most severe at C6-7. Prevertebral soft tissues are not thickened on sagittal reformats. Spinal canal is widely patent. No high-grade neuroforaminal narrowing. Upper chest: Biapical scarring. Other: 2.9 cm left thyroid nodule noted, previous assessed on sonogram of 04/02/2014 and biopsied on 04/16/2014. IMPRESSION: 1. No acute intracranial abnormality. No calvarial fracture. 2. Stable senescent change. 3. No acute fracture or listhesis of the cervical spine. 4. 2.9 cm left thyroid nodule noted, previous assessed on sonogram of 04/02/2014 and biopsied on 04/16/2014. Electronically Signed   By: Helyn Numbers M.D.   On: 01/08/2023 23:12   CT Cervical Spine Wo Contrast  Result Date: 01/08/2023 CLINICAL DATA:  Head trauma, minor (Age >= 65y); Neck trauma (Age >= 65y) EXAM: CT HEAD WITHOUT CONTRAST CT CERVICAL SPINE WITHOUT CONTRAST TECHNIQUE: Multidetector CT imaging of the head and cervical spine was performed following the standard protocol without intravenous contrast. Multiplanar CT  image reconstructions of the cervical spine were also generated. RADIATION DOSE REDUCTION: This exam was performed according to the departmental dose-optimization program which includes automated exposure control, adjustment of the mA and/or kV according to patient size and/or use of iterative reconstruction technique. COMPARISON:  None Available. FINDINGS: CT HEAD FINDINGS Brain: Normal anatomic configuration. Parenchymal volume loss is commensurate with the patient's age. Mild periventricular white matter changes are present likely reflecting the sequela of small vessel ischemia. Remote lacunar infarct within the left thalamus. No abnormal intra or extra-axial mass lesion or fluid collection. No abnormal mass effect or midline shift. No evidence of acute intracranial hemorrhage or infarct. Stable borderline ventriculomegaly which appears commensurate with the degree of parenchymal volume loss and likely represents central atrophy. Cerebellum unremarkable. Vascular: No asymmetric hyperdense vasculature at the skull base. Skull: Intact Sinuses/Orbits: Paranasal sinuses are clear. Orbits are unremarkable. Other: Mastoid air cells and middle ear cavities are clear. CT CERVICAL SPINE FINDINGS Alignment: Normal.  Skull base and vertebrae: No acute fracture. No primary bone lesion or focal pathologic process. Soft tissues and spinal canal: No prevertebral fluid or swelling. No visible canal hematoma. Disc levels: Intervertebral disc space narrowing endplate remodeling noted in keeping with changes of diffuse mild to moderate degenerative disc disease most severe at C6-7. Prevertebral soft tissues are not thickened on sagittal reformats. Spinal canal is widely patent. No high-grade neuroforaminal narrowing. Upper chest: Biapical scarring. Other: 2.9 cm left thyroid nodule noted, previous assessed on sonogram of 04/02/2014 and biopsied on 04/16/2014. IMPRESSION: 1. No acute intracranial abnormality. No calvarial fracture.  2. Stable senescent change. 3. No acute fracture or listhesis of the cervical spine. 4. 2.9 cm left thyroid nodule noted, previous assessed on sonogram of 04/02/2014 and biopsied on 04/16/2014. Electronically Signed   By: Helyn Numbers M.D.   On: 01/08/2023 23:12   CT FEMUR RIGHT WO CONTRAST  Result Date: 01/08/2023 CLINICAL DATA:  Mid femoral pain.  Fell. EXAM: CT OF THE LOWER RIGHT THIGH WITHOUT CONTRAST TECHNIQUE: Multidetector CT imaging of the right lower extremity was performed according to the standard protocol. RADIATION DOSE REDUCTION: This exam was performed according to the departmental dose-optimization program which includes automated exposure control, adjustment of the mA and/or kV according to patient size and/or use of iterative reconstruction technique. COMPARISON:  CT of abdomen and pelvis 09/01/2022, right knee films from today, AP pelvis and bilateral hip films from today, and right hip views from 12/16/2022. FINDINGS: Bones/Joint/Cartilage There is diffuse osteopenia. No displaced fracture seen of the visualized right hemipelvis, visualized right hemisacrum, the femur, and visualized proximal tibia. There is a nondisplaced acute transverse fracture across the fibular head and neck junction. There is mild nonerosive degenerative arthrosis at the right SI joint and right hip. No hip joint effusion is seen. There are tricompartmental degenerative changes at the knee, consisting of joint space loss and osteophytes of the lateral greater than medial femorotibial compartments and of the patellofemoral joint. There is no erosive arthropathy. There is a small low-density suprapatellar bursal effusion. There are degenerative subcortical cystic changes in the posterior aspect of the lateral femoral condyle. Ligaments Suboptimally assessed by CT. Muscles and Tendons Suboptimal depiction of the tendons with CT. No major tendon avulsion is seen. The extensor mechanism is intact. there is partial atrophy  of the hip adductor musculature and gluteal muscles, partial atrophy of the vastus lateralis and medial head of the gastrocnemius. No intramuscular hematoma or mass are seen. Soft tissues There is no subcutaneous hematoma. No fluid, mass or hemorrhage in the right hemipelvis. Mild calcific plaque noted in the femoral artery, tibioperoneal trunk and proximal anterior tibial artery. IMPRESSION: 1. Acute nondisplaced transverse fracture across the fibular head and neck junction. 2. Osteopenia and degenerative change. 3. No displaced fracture of the right femur or visualized proximal tibia. 4. Small low-density suprapatellar bursal effusion. No fat-fluid level or hemarthrosis. 5. Calcific plaque in the femoral artery, tibioperoneal trunk and proximal anterior tibial artery. 6. Remaining findings discussed above. Electronically Signed   By: Almira Bar M.D.   On: 01/08/2023 21:24   CT PELVIS WO CONTRAST  Result Date: 01/08/2023 CLINICAL DATA:  Right hip trauma, fracture suspected, right thigh pain. Fell today. EXAM: CT PELVIS WITHOUT CONTRAST TECHNIQUE: Multidetector CT imaging of the pelvis was performed following the standard protocol without intravenous contrast. RADIATION DOSE REDUCTION: This exam was performed according to the departmental dose-optimization program which includes automated exposure control, adjustment of the mA and/or kV according to  patient size and/or use of iterative reconstruction technique. COMPARISON:  Lumbar spine MRI 11/18/2022, lumbar spine CT 11/17/2022, CT abdomen and pelvis 09/01/2022. FINDINGS: Urinary Tract: The pelvic ureters are normal caliber. No ureteral stones are seen. The bladder wall and lumen are unremarkable. Bowel: Old postsurgical change transverse colon and rectosigmoid junction. Moderate fecal stasis. No bowel dilatation, wall thickening or inflammatory changes in the pelvis. Vascular/Lymphatic: There is aortoiliac scattered calcific plaque. No aneurysm is seen.  Please note the aorta is only included just above the bifurcation. No lymphadenopathy or mass is seen. Reproductive:  No mass or other significant abnormality Other: Old cholecystectomy. Lower portions of the liver are unremarkable. There is no free air, free hemorrhage, fluid collections or ascites. There are no incarcerated hernias. Musculoskeletal: Osteopenia. There is no sacral insufficiency fracture. No displaced fracture of the bony pelvis. Spurring and erosive changes are noted chronically at the symphysis pubis. The visualized proximal femurs are intact. There is mild symmetric joint space loss at the hips, small acetabular and bilateral femoral head spurs, without erosive arthropathy or joint effusions. At L5, there is a new (since the lumbar MRI of 11/18/2022) and possibly acute superior endplate concave central compression fracture of the L5 vertebral body, with 30% loss of the central vertebral height, slight posterosuperior cortical retropulsion, 20% posterior height loss and no notable anterior height loss. Interval kyphoplasty has been performed at L4 which is only partially imaged through the inferior endplate with the L4 inferior endplate having undergone further fragmentation since the prior studies. The pedicles and posterior elements are intact. No canal hematoma is seen. Acquired spinal stenosis, moderate to severe at the L4-5 level appears unchanged since 09/01/2022. Facet hypertrophy and lumbar spondylosis, and spinous process abutment L3-S1 are unchanged. IMPRESSION: 1. New L5 superior endplate concave central compression fracture with 30% loss of the central vertebral height, 20% posterior height loss and slight posterosuperior cortical retropulsion and fragmentation. 2. I would assume this is probably acute although could have occurred anytime since 11/18/2022 MRI. 3. No canal hematoma is seen. The degree of acquired spinal stenosis at L4-5 has not appreciably changed since 09/01/2022. 4.  Interval L4 kyphoplasty with further fragmentation of the L4 inferior endplate since the March studies. 5. Osteopenia and degenerative change with no displaced pelvic, sacral or proximal femoral fractures. 6. Constipation and diverticulosis.  Postsurgical changes. 7. Aortic atherosclerosis. Aortic Atherosclerosis (ICD10-I70.0). Electronically Signed   By: Almira Bar M.D.   On: 01/08/2023 21:03   DG Knee Right Port  Result Date: 01/08/2023 CLINICAL DATA:  Pain, fell EXAM: PORTABLE RIGHT KNEE - 1-2 VIEW COMPARISON:  None Available. FINDINGS: Frontal, bilateral oblique, lateral views of the right knee are obtained. No acute fracture, subluxation, or dislocation. Mild 3 compartmental osteoarthritis. No joint effusion. Diffuse subcutaneous edema. IMPRESSION: 1. No acute fracture. 2. 3 compartmental osteoarthritis. 3. Diffuse subcutaneous edema. Electronically Signed   By: Sharlet Salina M.D.   On: 01/08/2023 18:52   DG Tibia/Fibula Left  Result Date: 01/08/2023 CLINICAL DATA:  Larey Seat, pain EXAM: LEFT TIBIA AND FIBULA - 2 VIEW COMPARISON:  None Available. FINDINGS: Frontal and lateral views of the left tibia and fibula are obtained. The bones are diffusely osteopenic. No acute fracture. Alignment of the left knee and ankle is anatomic. Mild osteoarthritis. There is diffuse subcutaneous edema. IMPRESSION: 1. No acute displaced fracture. 2. Osteopenia. 3. Osteoarthritis of the left knee and ankle. 4. Diffuse soft tissue edema. Electronically Signed   By: Sharlet Salina M.D.   On:  01/08/2023 18:50   DG Tibia/Fibula Right  Result Date: 01/08/2023 CLINICAL DATA:  Larey Seat, pain EXAM: RIGHT TIBIA AND FIBULA - 2 VIEW COMPARISON:  None Available. FINDINGS: Frontal and lateral views of the right tibia and fibula are obtained. Bones are diffusely osteopenic. No acute displaced fracture. Alignment is anatomic. Mild osteoarthritis of the right hip and knee. Mild diffuse subcutaneous edema. IMPRESSION: 1. No acute fracture. 2.  Osteopenia. 3. Osteoarthritis of the right knee and ankle. Electronically Signed   By: Sharlet Salina M.D.   On: 01/08/2023 18:49   DG Hips Bilat W or Wo Pelvis 3-4 Views  Result Date: 01/08/2023 CLINICAL DATA:  Bilateral hip and thigh pain after fall EXAM: DG HIP (WITH OR WITHOUT PELVIS) 3-4V BILAT COMPARISON:  None Available. FINDINGS: Demineralization. There is no evidence of hip fracture or dislocation. Degenerative changes pubic symphysis, both hips, SI joints and lower lumbar spine. Vertebroplasty L4. IMPRESSION: No acute fracture or dislocation. Electronically Signed   By: Minerva Fester M.D.   On: 01/08/2023 18:48   DG Foot 2 Views Left  Result Date: 01/08/2023 CLINICAL DATA:  Larey Seat, left foot pain EXAM: LEFT FOOT - 2 VIEW COMPARISON:  None Available. FINDINGS: Frontal and lateral views of the left foot are obtained. The bones are diffusely osteopenic. Hammertoe deformities are noted. There are no acute displaced fractures. There is multifocal osteoarthritis greatest at the first metatarsophalangeal joint and midfoot. Diffuse soft tissue edema. IMPRESSION: 1. No acute fracture. 2. Osteopenia, with hammertoe deformities and multifocal osteoarthritis as above. 3. Diffuse subcutaneous edema. Electronically Signed   By: Sharlet Salina M.D.   On: 01/08/2023 18:47    Review of Systems  Musculoskeletal:  Positive for back pain, falls, joint pain and myalgias.  All other systems reviewed and are negative.  Blood pressure (!) 135/42, pulse (!) 53, temperature 98.5 F (36.9 C), temperature source Oral, resp. rate 16, height 5\' 5"  (1.651 m), weight 73.2 kg, SpO2 99 %. Physical Exam HENT:     Head: Normocephalic.  Eyes:     Pupils: Pupils are equal, round, and reactive to light.  Cardiovascular:     Rate and Rhythm: Normal rate.  Pulmonary:     Effort: Pulmonary effort is normal.  Abdominal:     General: Abdomen is flat.     Palpations: Abdomen is soft.  Musculoskeletal:        General:  Tenderness present.     Comments: Patient motor/5/5 lower extremities.  Right lower extremity is in a posterior splint but she is able to dorsiflex in the splint.  Good capillary refill and good pulses.  No DVT.  There is some swelling to the right knee.  Medial lateral joint line tenderness.  Able to perform an active straight leg raise.  Pelvis is stable tender lumbosacral junction.  She is tender to palpation of the right fibula.  The posterior splint comes up to the fibular head posteriorly. Minimum discomfort with range of motion of her cervical spine.  Motor is 5/5 in the upper extremities.     Assessment/Plan:  1.  Acute L5 compression fracture.  20% compression middle column without any significant retropulsion.  Patient neurologically intact.  MRI pending of the lumbar spine. Recommend an LSO lumbar brace when the patient is up and out of bed.  2.  Right fibular head fracture nondisplaced patient neurologically intact.  This should heal with conservative treatment.  To improve mobilization out of bed I would recommend a right knee immobilizer and discontinue  the posterior splint.  As that splint posteriorly ends at the level of the fracture.  She can weight-bear as tolerated.  She can have the knee immobilizer off when in bed.  Javier Docker 01/09/2023, 8:02 AM

## 2023-01-09 NOTE — Assessment & Plan Note (Signed)
Continue home regimen of clonidine

## 2023-01-10 ENCOUNTER — Telehealth: Payer: Self-pay

## 2023-01-10 ENCOUNTER — Encounter: Payer: Self-pay | Admitting: *Deleted

## 2023-01-10 ENCOUNTER — Ambulatory Visit: Payer: Self-pay | Admitting: *Deleted

## 2023-01-10 ENCOUNTER — Encounter (HOSPITAL_COMMUNITY): Payer: Self-pay | Admitting: Internal Medicine

## 2023-01-10 ENCOUNTER — Observation Stay (HOSPITAL_COMMUNITY): Payer: Medicare Other

## 2023-01-10 DIAGNOSIS — S82831A Other fracture of upper and lower end of right fibula, initial encounter for closed fracture: Secondary | ICD-10-CM | POA: Diagnosis not present

## 2023-01-10 DIAGNOSIS — S32050A Wedge compression fracture of fifth lumbar vertebra, initial encounter for closed fracture: Secondary | ICD-10-CM | POA: Diagnosis not present

## 2023-01-10 DIAGNOSIS — I4819 Other persistent atrial fibrillation: Secondary | ICD-10-CM | POA: Diagnosis not present

## 2023-01-10 DIAGNOSIS — M25572 Pain in left ankle and joints of left foot: Secondary | ICD-10-CM | POA: Diagnosis present

## 2023-01-10 DIAGNOSIS — E871 Hypo-osmolality and hyponatremia: Secondary | ICD-10-CM | POA: Diagnosis not present

## 2023-01-10 LAB — CBC WITH DIFFERENTIAL/PLATELET
Abs Immature Granulocytes: 0.04 10*3/uL (ref 0.00–0.07)
Basophils Absolute: 0 10*3/uL (ref 0.0–0.1)
Basophils Relative: 0 %
Eosinophils Absolute: 0.2 10*3/uL (ref 0.0–0.5)
Eosinophils Relative: 2 %
HCT: 26.4 % — ABNORMAL LOW (ref 36.0–46.0)
Hemoglobin: 8.4 g/dL — ABNORMAL LOW (ref 12.0–15.0)
Immature Granulocytes: 1 %
Lymphocytes Relative: 17 %
Lymphs Abs: 1.3 10*3/uL (ref 0.7–4.0)
MCH: 31.1 pg (ref 26.0–34.0)
MCHC: 31.8 g/dL (ref 30.0–36.0)
MCV: 97.8 fL (ref 80.0–100.0)
Monocytes Absolute: 0.9 10*3/uL (ref 0.1–1.0)
Monocytes Relative: 12 %
Neutro Abs: 5.2 10*3/uL (ref 1.7–7.7)
Neutrophils Relative %: 68 %
Platelets: 187 10*3/uL (ref 150–400)
RBC: 2.7 MIL/uL — ABNORMAL LOW (ref 3.87–5.11)
RDW: 14.4 % (ref 11.5–15.5)
WBC: 7.6 10*3/uL (ref 4.0–10.5)
nRBC: 0 % (ref 0.0–0.2)

## 2023-01-10 LAB — COMPREHENSIVE METABOLIC PANEL
ALT: 26 U/L (ref 0–44)
AST: 20 U/L (ref 15–41)
Albumin: 2.9 g/dL — ABNORMAL LOW (ref 3.5–5.0)
Alkaline Phosphatase: 72 U/L (ref 38–126)
Anion gap: 7 (ref 5–15)
BUN: 38 mg/dL — ABNORMAL HIGH (ref 8–23)
CO2: 33 mmol/L — ABNORMAL HIGH (ref 22–32)
Calcium: 8.3 mg/dL — ABNORMAL LOW (ref 8.9–10.3)
Chloride: 89 mmol/L — ABNORMAL LOW (ref 98–111)
Creatinine, Ser: 1.54 mg/dL — ABNORMAL HIGH (ref 0.44–1.00)
GFR, Estimated: 33 mL/min — ABNORMAL LOW (ref >60)
Glucose, Bld: 136 mg/dL — ABNORMAL HIGH (ref 70–99)
Potassium: 4.5 mmol/L (ref 3.5–5.1)
Sodium: 129 mmol/L — ABNORMAL LOW (ref 135–145)
Total Bilirubin: 0.7 mg/dL (ref 0.3–1.2)
Total Protein: 5.9 g/dL — ABNORMAL LOW (ref 6.5–8.1)

## 2023-01-10 LAB — URIC ACID: Uric Acid, Serum: 12 mg/dL — ABNORMAL HIGH (ref 2.5–7.1)

## 2023-01-10 LAB — MAGNESIUM: Magnesium: 3.2 mg/dL — ABNORMAL HIGH (ref 1.7–2.4)

## 2023-01-10 NOTE — Progress Notes (Signed)
Physical Therapy Treatment Patient Details Name: [name] Overall MRN: [id number] DOB: Jul 17, 1935 Today's Date: 01/10/2023   History of Present Illness Pt admitted from home s/p fall with L-5 compression fx and R fibular head fx (both nonoperative).  Pt with hx of HOH, CHF, colon CA, breast CA, CAD, COPD, CVA, afib, recent L3 and L4 compression fx with balloon kyphoplasty    PT Comments    Pt agreeable to PT, amb incr distance this session however continues to require assist for balance throughout as as cues for back precautions   Recommendations for follow up therapy are one component of a multi-disciplinary discharge planning process, led by the attending physician.  Recommendations Engelson be updated based on patient status, additional functional criteria and insurance authorization.  Follow Up Recommendations  Can patient physically be transported by private vehicle: No    Assistance Recommended at Discharge Intermittent Supervision/Assistance  Patient can return home with the following Assistance with cooking/housework;Assist for transportation;Help with stairs or ramp for entrance;A little help with walking and/or transfers;A little help with bathing/dressing/bathroom   Equipment Recommendations  None recommended by PT    Recommendations for Other Services       Precautions / Restrictions Precautions Precautions: Fall Required Braces or Orthoses: Knee Immobilizer - Right;Spinal Brace Knee Immobilizer - Right: On when out of bed or walking Spinal Brace: Other (comment) (LSO) Spinal Brace Comments: adjusted by PT and applied in sitting Restrictions Weight Bearing Restrictions: No Other Position/Activity Restrictions: WBAT     Mobility  Bed Mobility               General bed mobility comments: pt received in recliner and returned to same    Transfers Overall transfer level: Needs assistance Equipment used: Rolling walker (2 wheels) Transfers: Sit to/from Stand Sit to  Stand: Min assist           General transfer comment: cues for LE management and use of UEs to self assist.  Physical assist to bring wt up and fwd and to balance in standing  with RW;    Ambulation/Gait Ambulation/Gait assistance: Min assist Gait Distance (Feet): 30 Feet Assistive device: Rolling walker (2 wheels) Gait Pattern/deviations: Step-through pattern, Decreased step length - right, Decreased step length - left, Trunk flexed       General Gait Details: cues for RW position, trunnk ext as able, assist to steady and maneuver RW   Stairs             Wheelchair Mobility    Modified Rankin (Stroke Patients Only)       Balance           Standing balance support: Bilateral upper extremity supported Standing balance-Leahy Scale: Poor                              Cognition Arousal/Alertness: Awake/alert Behavior During Therapy: WFL for tasks assessed/performed Overall Cognitive Status: Within Functional Limits for tasks assessed                                          Exercises General Exercises - Lower Extremity Ankle Circles/Pumps: AROM, Both, 10 reps    General Comments        Pertinent Vitals/Pain Pain Assessment Pain Assessment: 0-10 Pain Score: 5  Pain Location: back and R LE Pain Descriptors / Indicators: Discomfort,  Grimacing, Sore Pain Intervention(s): Limited activity within patient's tolerance, Monitored during session    Home Living                          Prior Function            PT Goals (current goals can now be found in the care plan section) Acute Rehab PT Goals Patient Stated Goal: Less pain, regain IND PT Goal Formulation: With patient Time For Goal Achievement: 01/23/23 Potential to Achieve Goals: Good Progress towards PT goals: Progressing toward goals    Frequency    Min 1X/week      PT Plan Current plan remains appropriate    Co-evaluation               AM-PAC PT "6 Clicks" Mobility   Outcome Measure  Help needed turning from your back to your side while in a flat bed without using bedrails?: A Lot Help needed moving from lying on your back to sitting on the side of a flat bed without using bedrails?: A Lot Help needed moving to and from a bed to a chair (including a wheelchair)?: A Little Help needed standing up from a chair using your arms (e.g., wheelchair or bedside chair)?: A Little Help needed to walk in hospital room?: A Little Help needed climbing 3-5 steps with a railing? : A Lot 6 Click Score: 15    End of Session Equipment Utilized During Treatment: Gait belt;Right knee immobilizer;Other (comment) (LSO) Activity Tolerance: Patient tolerated treatment well;Patient limited by fatigue Patient left: in chair;with call bell/phone within reach;with family/visitor present (no alarm-in chair on PT arrival)   PT Visit Diagnosis: Unsteadiness on feet (R26.81);Muscle weakness (generalized) (M62.81);Difficulty in walking, not elsewhere classified (R26.2);Pain Pain - Right/Left: Right Pain - part of body: Leg (back)     Time: 1610-9604 PT Time Calculation (min) (ACUTE ONLY): 18 min  Charges:  $Gait Training: 8-22 mins                     Delice Bison, PT  Acute Rehab Dept ([facility]) [phone]  01/10/2023    [facility] 01/10/2023, 12:04 PM

## 2023-01-10 NOTE — NC FL2 (Signed)
[place] MEDICAID FL2 LEVEL OF CARE FORM     IDENTIFICATION  Patient Name: [name] Birthdate: 1935/08/13 Sex: female Admission Date (Current Location): 01/08/2023  [facility] [place] Number:  Producer, television/film/video and Address:  [facility],  [address] [place], [place] [address]      Provider Number: [id number]  Attending Physician Name and Address:  Marinda Elk, MD  Relative Name and Phone Number:  son, Latoria Lizcano @ [phone]    Current Level of Care: Hospital Recommended Level of Care: Skilled Nursing Facility Prior Approval Number:    Date Approved/Denied:   PASRR Number: [id number]  Discharge Plan: SNF    Current Diagnoses: Patient Active Problem List   Diagnosis Date Noted   Closed disp comminuted fracture of shaft of right fibula with malunion 01/09/2023   Fall 01/09/2023   GAD (generalized anxiety disorder) 01/09/2023   Anemia of chronic disease 01/09/2023   Closed fracture of head of right fibula 01/09/2023   Closed right fibular fracture 01/09/2023   Vitamin D deficiency 01/09/2023   Closed compression fracture of L5 lumbar vertebra, initial encounter (HCC) 01/08/2023   Hyponatremia 11/18/2022   Lower back pain 11/18/2022   Normocytic anemia 11/18/2022   Closed compression fracture of L3 lumbar vertebra, initial encounter (HCC) 11/17/2022   Acute bacterial sinusitis 10/27/2022   Abnormal results on imaging study of genitourinary system 03/25/2022   NSTEMI (non-ST elevated myocardial infarction) (HCC)    Persistent atrial fibrillation (HCC) 03/15/2022   Chronic kidney disease, stage 3b (HCC) 03/15/2022   Elevated troponin 03/15/2022   Postmenopausal bleeding 06/05/2020   Chest pain 07/23/2019   Diverticulitis large intestine w/o perforation or abscess w/o bleeding 09/04/2018   Colovesical fistula    Preoperative clearance 08/07/2018   TIA (transient ischemic attack) 01/06/2018   Breast cancer (HCC) 09/13/2017   Obesity,  unspecified 09/13/2017   Warfarin anticoagulation 09/13/2017   Uterine mass 09/07/2017   Adnexal mass 08/03/2017   Pelvic abscess in female 07/07/2017   Aortic stenosis    Dyslipidemia, goal LDL below 70 11/28/2016   Monitoring for long-term anticoagulant use 12/31/2015   Chronic diastolic CHF (congestive heart failure) (HCC) 10/28/2015   Insomnia 10/23/2015   Leg swelling 08/11/2015   Takotsubo syndrome 07/29/2015   DOE (dyspnea on exertion) 08/15/2014   Dilated aortic root (HCC)    Pulmonary HTN (HCC)    Permanent atrial fibrillation (HCC) 07/04/2013   Chronic anticoagulation 07/04/2013   Coronary artery disease    Lesion of nipple 05/15/2013   GERD (gastroesophageal reflux disease)    Essential hypertension    COPD (chronic obstructive pulmonary disease) (HCC)    Osteopenia    Diverticulosis    Hiatal hernia    Abdominal pain 01/11/2012   Breast cancer of upper-outer quadrant of left female breast (HCC) 01/06/2012   Colon cancer (HCC) 01/06/2012    Orientation RESPIRATION BLADDER Height & Weight     Self, Time, Situation, Place  Normal Continent Weight: 161 lb 6 oz (73.2 kg) Height:  5\' 5"  (165.1 cm)  BEHAVIORAL SYMPTOMS/MOOD NEUROLOGICAL BOWEL NUTRITION STATUS      Continent Diet (regular)  AMBULATORY STATUS COMMUNICATION OF NEEDS Skin   Limited Assist Verbally Normal                       Personal Care Assistance Level of Assistance  Bathing, Dressing Bathing Assistance: Limited assistance   Dressing Assistance: Limited assistance     Functional Limitations Info  Sight, Hearing, Speech Sight Info: Adequate Hearing Info: Adequate Speech Info: Adequate    SPECIAL CARE FACTORS FREQUENCY  OT (By licensed OT), PT (By licensed PT)     PT Frequency: 5x/wk OT Frequency: 5x/wk            Contractures Contractures Info: Not present    Additional Factors Info  Code Status, Allergies Code Status Info: Full Allergies Info: Contrast Media (Iodinated  Contrast Media), Clonidine Derivatives, Statins, Sulfa Antibiotics, Celebrex (Celecoxib), Isordil (Isosorbide Nitrate), Tape           Current Medications (01/10/2023):  This is the current hospital active medication list Current Facility-Administered Medications  Medication Dose Route Frequency Provider Last Rate Last Admin   acetaminophen (TYLENOL) tablet 650 mg  650 mg Oral Q6H PRN Gowens, Mariah L, PA-C   650 mg at 01/09/23 0424   acetaminophen (TYLENOL) tablet 650 mg  650 mg Oral BID Marinda Elk, MD   650 mg at 01/10/23 0859   alum & mag hydroxide-simeth (MAALOX/MYLANTA) 200-200-20 MG/5ML suspension 30 mL  30 mL Oral Q6H PRN Marinda Elk, MD   30 mL at 01/09/23 2002   apixaban (ELIQUIS) tablet 5 mg  5 mg Oral BID Howerter, Justin B, DO   5 mg at 01/10/23 0900   calcitonin (salmon) (MIACALCIN/FORTICAL) nasal spray 1 spray  1 spray Alternating Nares Daily Smoot, Sarah A, PA-C   1 spray at 01/10/23 0900   clonazePAM (KLONOPIN) tablet 0.5 mg  0.5 mg Oral BID PRN Howerter, Justin B, DO   0.5 mg at 01/09/23 2001   cloNIDine (CATAPRES) tablet 0.2 mg  0.2 mg Oral BID Howerter, Justin B, DO   0.2 mg at 01/10/23 0859   docusate sodium (COLACE) capsule 100 mg  100 mg Oral BID Howerter, Justin B, DO   100 mg at 01/10/23 0859   ferrous sulfate tablet 325 mg  325 mg Oral Q breakfast Howerter, Justin B, DO   325 mg at 01/10/23 0900   melatonin tablet 3 mg  3 mg Oral QHS PRN Howerter, Justin B, DO   3 mg at 01/09/23 0208   methocarbamol (ROBAXIN) tablet 500 mg  500 mg Oral Q6H PRN Howerter, Justin B, DO   500 mg at 01/09/23 0424   mometasone-formoterol (DULERA) 100-5 MCG/ACT inhaler 2 puff  2 puff Inhalation BID Howerter, Justin B, DO   2 puff at 01/10/23 0911   oxyCODONE-acetaminophen (PERCOCET/ROXICET) 5-325 MG per tablet 1 tablet  1 tablet Oral Q4H PRN Shalhoub, Deno Lunger, MD   1 tablet at 01/10/23 [phone]   Or   morphine (PF) 4 MG/ML injection 4 mg  4 mg Intravenous Q4H PRN Shalhoub, Deno Lunger,  MD   4 mg at 01/09/23 2353   naloxone [facility]) injection 0.4 mg  0.4 mg Intravenous PRN Howerter, Justin B, DO       ondansetron (ZOFRAN) injection 4 mg  4 mg Intravenous Q6H PRN Howerter, Justin B, DO       torsemide (DEMADEX) tablet 40 mg  40 mg Oral BID Howerter, Justin B, DO   40 mg at 01/10/23 0900   Vitamin D (Ergocalciferol) (DRISDOL) 1.25 MG (50000 UNIT) capsule 50,000 Units  50,000 Units Oral Q7 days Shalhoub, Deno Lunger, MD   50,000 Units at 01/10/23 0900     Discharge Medications: Please see discharge summary for a list of discharge medications.  Relevant Imaging Results:  Relevant Lab Results:   Additional Information SSN: [id number]  Amada Jupiter, LCSW

## 2023-01-10 NOTE — Progress Notes (Addendum)
PROGRESS NOTE   [name]  ZOX:[id number] DOB: 10-11-1934 DOA: 01/08/2023 PCP: Donita Brooks, MD   Date of Service: the patient was seen and examined on 01/10/2023  Brief Narrative:  88 year old female with past medical history of chronic hyponatremia, permanent atrial fibrillation on Eliquis, diastolic congestive heart failure, hypertension, anemia of chronic disease presenting to [facility] emergency department with complaints of fall after tripping and low back pain.  Upon evaluation in the emergency department trauma evaluation included a CTA scan of the right femur revealing an acute nondisplaced fracture across the fibular head and neck junction along with CT imaging of the pelvis identifying an L5 superior endplate central compression fracture with slight posterior superior retropulsion and fragmentation.  EDP discussed case with Dr. Shon Baton with orthopedic surgery who recommended TLSO bracing and conservative measures for management of the compression fracture.  They additionally felt that the right fibular head fracture likely also did not require surgical intervention but agreed to consult.  The hospitalist group was then called to assess the patient for admission to the hospital.   Assessment and Plan: * Closed compression fracture of L5 lumbar vertebra, initial encounter (HCC) Improving pain on current analgesic regimen including twice daily Tylenol, intranasal calcitonin and as needed opiate-based analgesics TLSO brace has been ordered per orthopedic surgery recommendations MRI lumbar spine ordered and pending per orthopedic surgery Vitamin D found to be deficient, initiated on ergocalciferol 50,000 units q. weekly. PT evaluation performed, recommending skilled physical therapy services in a skilled nursing facility   Closed right fibular fracture Conservative management as this is not weightbearing Knee immobilizer per orthopedic surgery recommendations which have  been ordered  Left ankle pain Ongoing severe left ankle pain that preceded the patient's fall Plain film imaging of the ankle performed on admission revealed no evidence of fracture Due to persisting pain, obtaining CT imaging of the joint Uric acid level has been obtained and is elevated however there is no obvious physical exam evidence of gout Will consider trial of colchicine if CT reveals no evidence of fracture.  Persistent atrial fibrillation (HCC) Continue home regimen of Eliquis Rate controlled  Hyponatremia Chronic Monitoring sodium levels with serial chemistries  Chronic diastolic CHF (congestive heart failure) (HCC) No clinical evidence of cardiogenic volume overload Strict input and output monitoring Daily weights Low-sodium diet   Essential hypertension Continue home regimen of clonidine  Vitamin D deficiency Low vitamin D of 26.24 on this hospitalization despite outpatient supplementation Initiating ergocalciferol 50,000 units q. weekly.    Subjective:  Patient complaining of low back pain, mostly unchanged from yesterday.  Pain is severe when ambulating or weightbearing.  Patient is also complaining of continued left ankle pain however she reports that this has been ongoing for at least the past several months.  Physical Exam:  Vitals:   01/10/23 0524 01/10/23 0913 01/10/23 1256 01/10/23 2028  BP: (!) 149/44  (!) 154/54 (!) 165/58  Pulse: (!) 46  (!) 56 (!) 55  Resp: 17  18 17   Temp: 98.1 F (36.7 C)  97.8 F (36.6 C) 97.7 F (36.5 C)  TempSrc: Oral   Oral  SpO2: 93% 94% 94% 96%  Weight:      Height:        Constitutional: Awake alert and oriented x3, no associated distress.   Skin: no rashes, no lesions, good skin turgor noted. Eyes: Pupils are equally reactive to light.  No evidence of scleral icterus or conjunctival pallor.  ENMT: Moist mucous  membranes noted.  Posterior pharynx clear of any exudate or lesions.   Respiratory: clear to  auscultation bilaterally, no wheezing, no crackles. Normal respiratory effort. No accessory muscle use.  Cardiovascular: Regular rate and rhythm, no murmurs / rubs / gallops. No extremity edema. 2+ pedal pulses. No carotid bruits.  Abdomen: Abdomen is soft and nontender.  No evidence of intra-abdominal masses.  Positive bowel sounds noted in all quadrants.   Musculoskeletal: Pain with both passive and active range of motion of the left ankle Back: Midline lumbar tenderness on palpation without crepitus   Data Reviewed:  I have personally reviewed and interpreted labs, imaging.  Significant findings are   CBC: Recent Labs  Lab 01/08/23 2233 01/09/23 0325 01/10/23 0308  WBC 8.1 6.9 7.6  NEUTROABS  --  4.6 5.2  HGB 9.4* 8.7* 8.4*  HCT 29.2* 27.1* 26.4*  MCV 95.7 96.4 97.8  PLT 233 206 187   Basic Metabolic Panel: Recent Labs  Lab 01/04/23 1413 01/07/23 1524 01/08/23 2233 01/09/23 0325 01/09/23 0326 01/10/23 0308  NA 125* 132* 131* 132*  --  129*  K 5.5* 4.8 4.3 4.3  --  4.5  CL 87* 91* 92* 94*  --  89*  CO2 27 29 30 31   --  33*  GLUCOSE 145* 143* 154* 122*  --  136*  BUN 47* 33* 35* 36*  --  38*  CREATININE 1.27* 1.12* 1.13* 1.13*  --  1.54*  CALCIUM 9.4 9.7 8.7* 8.7*  --  8.3*  MG  --   --   --  2.8* 2.8* 3.2*   GFR: Estimated Creatinine Clearance: 26.3 mL/min (A) (by C-G formula based on SCr of 1.54 mg/dL (H)). Liver Function Tests: Recent Labs  Lab 01/09/23 0325 01/10/23 0308  AST 22 20  ALT 26 26  ALKPHOS 68 72  BILITOT 0.7 0.7  PROT 5.9* 5.9*  ALBUMIN 3.0* 2.9*     Code Status:  Full code.  Code status decision has been confirmed with: patient Family Communication: Son is at the bedside who has been updated on plan of care   Severity of Illness:  The appropriate patient status for this patient is OBSERVATION. Observation status is judged to be reasonable and necessary in order to provide the required intensity of service to ensure the patient's  safety. The patient's presenting symptoms, physical exam findings, and initial radiographic and laboratory data in the context of their medical condition is felt to place them at decreased risk for further clinical deterioration. Furthermore, it is anticipated that the patient will be medically stable for discharge from the hospital within 2 midnights of admission.   Time spent:  51 minutes  Author:  Marinda Elk MD  01/10/2023 10:32 PM

## 2023-01-10 NOTE — Progress Notes (Signed)
MRI Lumbar spine pending. Knee immobilizer on.  CT scan left ankle pending. LSO brace can be off while in bed. Will update after MRI Lumbar. Discussed with patient.  Patient is neurovascularly intact.  Knee immobilizer on.  No evidence of DVT.  She has dorsiflexion plantarflexion of the left foot and ankle some swelling about the left ankle with some tenderness over the anterior talofibular ligament.  Would recommend x-rays of the lumbar spine as MRI is still pending.  Reordered MRI as not done yet.

## 2023-01-10 NOTE — Patient Instructions (Signed)
Visit Information  Thank you for taking time to visit with me today. Please don't hesitate to contact me if I can be of assistance to you.   Following are the goals we discussed today:   Goals Addressed               This Visit's Progress     Provide Counseling & Supportive Services. (pt-stated)   On track     Care Coordination Interventions:  Interventions Today    Flowsheet Row Most Recent Value  Chronic Disease   Chronic disease during today's visit Atrial Fibrillation (AFib), Hypertension (HTN), Congestive Heart Failure (CHF), Chronic Kidney Disease/End Stage Renal Disease (ESRD), Chronic Obstructive Pulmonary Disease (COPD), Other  [Feelings of Sadness & Frustration Regarding Loss of Independence.]  General Interventions   General Interventions Discussed/Reviewed General Interventions Discussed, General Interventions Reviewed, Annual Eye Exam, Durable Medical Equipment (DME), Vaccines, Health Screening, Community Resources, Level of Care, Communication with, Doctor Visits  [Primary Care Provider]  Vaccines COVID-19, Flu, Pneumonia, RSV, Shingles, Tetanus/Pertussis/Diphtheria  [Encouraged]  Doctor Visits Discussed/Reviewed Doctor Visits Discussed, Doctor Visits Reviewed, Annual Wellness Visits, PCP, Specialist  [Encouraged]  Health Screening Bone Density, Colonoscopy, Mammogram  [Encouraged]  Durable Medical Equipment (DME) BP Cuff  PCP/Specialist Visits Compliance with follow-up visit  [Encouraged]  Communication with PCP/Specialists, RN  Level of Care Adult Daycare, Applications, Assisted Living, Personal Care Services  Applications Medicaid, Personal Care Services  Exercise Interventions   Exercise Discussed/Reviewed Exercise Discussed, Exercise Reviewed, Physical Activity, Weight Managment, Assistive device use and maintanence  [Encouraged]  Physical Activity Discussed/Reviewed Physical Activity Discussed, Physical Activity Reviewed, Types of exercise, Home Exercise Program  (HEP)  [Encouraged]  Weight Management Weight maintenance  [Encouraged]  Education Interventions   Education Provided Provided Therapist, sports, Provided Education  Provided Verbal Education On Nutrition, Foot Care, Eye Care, Mental Health/Coping with Illness, Applications, Exercise, Medication, Insurance Plans, [employer], When to see the doctor  Applications Medicaid, Personal Care Services  Mental Health Interventions   Mental Health Discussed/Reviewed Mental Health Discussed, Mental Health Reviewed, Coping Strategies, Suicide, Crisis, Substance Abuse, Grief and Loss, Depression, Anxiety  Refer to Social Work for counseling regarding Anxiety/Coping, Depression, Grief and Loss  Nutrition Interventions   Nutrition Discussed/Reviewed Nutrition Discussed, Nutrition Reviewed, Portion sizes, Decreasing sugar intake, Decreasing salt, Decreasing fats, Increaing proteins, Fluid intake, Adding fruits and vegetables  [Encouraged]  Pharmacy Interventions   Pharmacy Dicussed/Reviewed Pharmacy Topics Discussed, Pharmacy Topics Reviewed, Medication Adherence, Affording Medications  Safety Interventions   Safety Discussed/Reviewed Safety Discussed, Safety Reviewed, Fall Risk, Home Safety  [Encouraged]  Home Engineer, water, Refer for community resources  Advanced Directive Interventions   Advanced Directives Discussed/Reviewed Advanced Directives Discussed, Advanced Directives Reviewed     Active Listening & Reflection Utilized.  Verbalization of Feelings Encouraged.  Emotional Support Provided. Feelings of Caregiver Stress & Burnout Validated. Symptoms of Caregiver Fatigue Acknowledged. Caregiver Support Groups Reviewed. Self-Enrollment in Caregiver Support Group of Interest Emphasized. Problem Solving Interventions Employed. Task-Centered Solutions Activated.   Solution-Focused Strategies Implemented. Acceptance & Commitment Therapy Indicated. Cognitive Behavioral Therapy  Initiated. Client-Centered Therapy Performed. CSW Collaboration with Son, [name] Vanosdol Jr. to Confirm Patient's Admission to [facility] on 01/08/2023, Due to Injuries from Fall Sustained at Home. CSW Collaboration with Son, [name] Azad Jr. to Encourage Patient to Consider Higher Level of Care Placement, Upon Medical Clearance & Discharge from [facility]. CSW Collaboration with Duanne Guess Houseman Jr. to Explain Social Work Involvement & Agreement to Continue to Follow Patient  While Hospitalized, to Assist with Care Coordination & Discharge Planning Needs. CSW Collaboration with Son, [name] Gossman Jr. to [employer] with CSW [phone]# [phone]), if He Has Questions, Needs Assistance, or If Additional Social Work Needs Are Identified Between Now & Our Next Scheduled Follow-Up Outreach Call.      Our next appointment is by telephone on 01/24/2023 at 9:45 am.   Please call the care guide team at [phone] if you need to cancel or reschedule your appointment.   If you are experiencing a Mental Health or Behavioral Health Crisis or need someone to talk to, please call the Suicide and Crisis Lifeline: 988 call the [place] National Suicide Prevention Lifeline: (203) 247-4162 or TTY: 978-423-9724 TTY 310 222 8246) to talk to a trained counselor call 1-800-273-TALK (toll free, 24 hour hotline) go to [facility] [address] [place] [phone]) call the [facility] Crisis Line: 720-549-2615 call 911  Patient verbalizes understanding of instructions and care plan provided today and agrees to view in MyChart. Active MyChart status and patient understanding of how to access instructions and care plan via MyChart confirmed with patient.     Telephone follow up appointment with care management team member scheduled for:  01/24/2023 at 9:45 am.   Danford Bad, BSW, MSW, LCSW  Licensed Clinical Social Worker  [facility]  Mailing [place]. [address], [place], [place] [address] Physical Address-[address] [place], [place] [address] Toll Free Main # [phone] Fax # [phone] Cell # [phone] [email or web address]

## 2023-01-10 NOTE — Telephone Encounter (Signed)
Message from Referral coordinator Carin Primrose:  Dr. Tanya Nones:   I spoke to Tobi Bastos at [facility] about Ms. Wyszynski's back and muscle pain.  Tobi Bastos said the patient was seen there before.  She is currently in a contract with Rulon Abide NP for pain management, and they had ordered several tests and an injection.  They are going to reach out to the patient about her pain.  They also said they did not need a referral.  Would you like for me to close this referral?   Thanks  Bonita Quin

## 2023-01-10 NOTE — Plan of Care (Signed)
  Problem: Coping: Goal: Level of anxiety will decrease Outcome: Progressing   Problem: Pain Managment: Goal: General experience of comfort will improve Outcome: Progressing   

## 2023-01-10 NOTE — Patient Outreach (Signed)
Care Coordination   Follow Up Visit Note   01/10/2023  Name: [name] MRN: [id number] DOB: 15-Jul-1935  [name] is a 88 y.o. year old female who sees [name], [name], MD for primary care. I spoke with patient's son, [name]. by phone today.  What matters to the patients health and wellness today? Provide Counseling & Supportive Services.   Goals Addressed               This Visit's Progress     Provide Counseling & Supportive Services. (pt-stated)   On track     Care Coordination Interventions:  Interventions Today    Flowsheet Row Most Recent Value  Chronic Disease   Chronic disease during today's visit Atrial Fibrillation (AFib), Hypertension (HTN), Congestive Heart Failure (CHF), Chronic Kidney Disease/End Stage Renal Disease (ESRD), Chronic Obstructive Pulmonary Disease (COPD), Other  [Feelings of Sadness & Frustration Regarding Loss of Independence.]  General Interventions   General Interventions Discussed/Reviewed General Interventions Discussed, General Interventions Reviewed, Annual Eye Exam, Durable Medical Equipment (DME), Vaccines, Health Screening, Community Resources, Level of Care, Communication with, Doctor Visits  [Primary Care Provider]  Vaccines COVID-19, Flu, Pneumonia, RSV, Shingles, Tetanus/Pertussis/Diphtheria  [Encouraged]  Doctor Visits Discussed/Reviewed Doctor Visits Discussed, Doctor Visits Reviewed, Annual Wellness Visits, PCP, Specialist  [Encouraged]  Health Screening Bone Density, Colonoscopy, Mammogram  [Encouraged]  Durable Medical Equipment (DME) BP Cuff  PCP/Specialist Visits Compliance with follow-up visit  [Encouraged]  Communication with PCP/Specialists, RN  Level of Care Adult Daycare, Applications, Assisted Living, Personal Care Services  Applications Medicaid, Personal Care Services  Exercise Interventions   Exercise Discussed/Reviewed Exercise Discussed, Exercise Reviewed, Physical Activity, Weight Managment, Assistive  device use and maintanence  [Encouraged]  Physical Activity Discussed/Reviewed Physical Activity Discussed, Physical Activity Reviewed, Types of exercise, Home Exercise Program (HEP)  [Encouraged]  Weight Management Weight maintenance  [Encouraged]  Education Interventions   Education Provided Provided Therapist, sports, Provided Education  Provided Verbal Education On Nutrition, Foot Care, Eye Care, Mental Health/Coping with Illness, Applications, Exercise, Medication, Insurance Plans, [employer], When to see the doctor  Applications Medicaid, Personal Care Services  Mental Health Interventions   Mental Health Discussed/Reviewed Mental Health Discussed, Mental Health Reviewed, Coping Strategies, Suicide, Crisis, Substance Abuse, Grief and Loss, Depression, Anxiety  Refer to Social Work for counseling regarding Anxiety/Coping, Depression, Grief and Loss  Nutrition Interventions   Nutrition Discussed/Reviewed Nutrition Discussed, Nutrition Reviewed, Portion sizes, Decreasing sugar intake, Decreasing salt, Decreasing fats, Increaing proteins, Fluid intake, Adding fruits and vegetables  [Encouraged]  Pharmacy Interventions   Pharmacy Dicussed/Reviewed Pharmacy Topics Discussed, Pharmacy Topics Reviewed, Medication Adherence, Affording Medications  Safety Interventions   Safety Discussed/Reviewed Safety Discussed, Safety Reviewed, Fall Risk, Home Safety  [Encouraged]  Home Engineer, water, Refer for community resources  Advanced Directive Interventions   Advanced Directives Discussed/Reviewed Advanced Directives Discussed, Advanced Directives Reviewed     Active Listening & Reflection Utilized.  Verbalization of Feelings Encouraged.  Emotional Support Provided. Feelings of Caregiver Stress & Burnout Validated. Symptoms of Caregiver Fatigue Acknowledged. Caregiver Support Groups Reviewed. Self-Enrollment in Caregiver Support Group of Interest Emphasized. Problem Solving  Interventions Employed. Task-Centered Solutions Activated.   Solution-Focused Strategies Implemented. Acceptance & Commitment Therapy Indicated. Cognitive Behavioral Therapy Initiated. Client-Centered Therapy Performed. CSW Collaboration with Son, Susan Favre Tunison Jr. to Confirm Patient's Admission to [facility] on 01/08/2023, Due to Injuries from Fall Sustained at Home. CSW Collaboration with Son, Susan Favre Strege Jr. to Encourage Patient to Consider Higher Level of  Care Placement, Upon Medical Clearance & Discharge from [facility]. CSW Collaboration with Susan Guess Badie Jr. to Explain Social Work Involvement & Agreement to Continue to Follow Patient While Hospitalized, to Assist with Care Coordination & Discharge Planning Needs. CSW Collaboration with Son, Susan Favre Saia Jr. to [employer] with CSW [phone]# [phone]), if He Has Questions, Needs Assistance, or If Additional Social Work Needs Are Identified Between Now & Our Next Scheduled Follow-Up Outreach Call.      SDOH assessments and interventions completed:  Yes.  Care Coordination Interventions:  Yes, provided.   Follow up plan: Follow up call scheduled for 01/24/2023 at 9:45 am.   Encounter Outcome:  Pt. Visit Completed.   Danford Bad, BSW, MSW, LCSW  Licensed Restaurant manager, [employer] [facility]  Mailing [place] [place], [place] [address] Physical Address-[address] [place], [place] [address] Toll Free Main # [phone] Fax # [phone] Cell # [phone] [email or web address]

## 2023-01-10 NOTE — Evaluation (Signed)
Occupational Therapy Evaluation Patient Details Name: [name] MRN: [id number] DOB: [birth year] Today's Date: 01/10/2023   History of Present Illness Patient is a 88 year old female who was admitted from home s/p fall with L-5 compression fx and R fibular head fx (both nonoperative).  Pt with hx of HOH, CHF, colon CA, breast CA, CAD, COPD, CVA, afib, recent L3 and L4 compression fx with balloon kyphoplasty   Clinical Impression   Patient is a 88 year old female who was admitted for above. Patient was living at home alone with PRN support from son prior level. Currently, patient is TD for donning/doffing LSO, min A for transfers, with decreased functional activity tolerance increased pain with movement, and decreased standing balance impacting participation in ADLs.  Patient would continue to benefit from skilled OT services at this time while admitted and after d/c to address noted deficits in order to improve overall safety and independence in ADLs.       Recommendations for follow up therapy are one component of a multi-disciplinary discharge planning process, led by the attending physician.  Recommendations [place] be updated based on patient status, additional functional criteria and insurance authorization.   Assistance Recommended at Discharge Frequent or constant Supervision/Assistance  Patient can return home with the following A little help with walking and/or transfers;A lot of help with bathing/dressing/bathroom;Assistance with cooking/housework;Direct supervision/assist for medications management;Assist for transportation;Direct supervision/assist for financial management;Help with stairs or ramp for entrance    Functional Status Assessment  Patient has had a recent decline in their functional status and demonstrates the ability to make significant improvements in function in a reasonable and predictable amount of time.  Equipment Recommendations  None recommended by OT        Precautions / Restrictions Precautions Precautions: Fall;Back Required Braces or Orthoses: Knee Immobilizer - Right;Spinal Brace Knee Immobilizer - Right: On when out of bed or walking Spinal Brace: Other (comment) (LSO) Restrictions Weight Bearing Restrictions: No Other Position/Activity Restrictions: WBAT      Mobility Bed Mobility               General bed mobility comments: pt received in recliner and returned to same             Balance Overall balance assessment: Needs assistance Sitting-balance support: Feet supported, Bilateral upper extremity supported Sitting balance-Leahy Scale: Fair     Standing balance support: Bilateral upper extremity supported Standing balance-Leahy Scale: Poor           ADL either performed or assessed with clinical judgement   ADL Overall ADL's : Needs assistance/impaired Eating/Feeding: Modified independent;Sitting   Grooming: Supervision/safety;Standing;Oral care;Wash/dry face Grooming Details (indicate cue type and reason): with noted leaning on forearms with patient quick to fatigue with task. denture management. Upper Body Bathing: Min guard;Sitting   Lower Body Bathing: Moderate assistance;Sitting/lateral leans;Sit to/from stand Lower Body Bathing Details (indicate cue type and reason): patient was educated on proper positioning of KI to maintain support on RLE. patient needed max A to adjust ti. Upper Body Dressing : Sitting;Set up   Lower Body Dressing: Maximal assistance;Sit to/from stand;Sitting/lateral leans Lower Body Dressing Details (indicate cue type and reason): adjusting socks long sitting in recliner Toilet Transfer: Minimal assistance;Ambulation;Rolling walker (2 wheels) Toilet Transfer Details (indicate cue type and reason): patient was able to transfer from recliner to bathroom and back. patient declined to use bathroom at this tme. patient was educated on kicking RLE out to reduce pressure when  transitioning to sit. patient  needed min A for sit to stand. Toileting- Clothing Manipulation and Hygiene: Moderate assistance;Sit to/from stand                Pertinent Vitals/Pain Pain Assessment Pain Assessment: 0-10 Pain Score: 6  Pain Location: back and R LE Pain Descriptors / Indicators: Discomfort, Grimacing, Sore Pain Intervention(s): Limited activity within patient's tolerance, Monitored during session, Repositioned     Hand Dominance Right   Extremity/Trunk Assessment Upper Extremity Assessment Upper Extremity Assessment: Overall WFL for tasks assessed   Lower Extremity Assessment Lower Extremity Assessment: Defer to PT evaluation (KI RLE)   Cervical / Trunk Assessment Cervical / Trunk Assessment: Kyphotic   Communication Communication Communication: HOH   Cognition Arousal/Alertness: Awake/alert Behavior During Therapy: WFL for tasks assessed/performed Overall Cognitive Status: Within Functional Limits for tasks assessed                    Home Living Family/patient expects to be discharged to:: Unsure Living Arrangements: Alone           Additional Comments: Son lives next door      Prior Functioning/Environment Prior Level of Function : Independent/Modified Independent             Mobility Comments: using RW since prior compression fx ADLs Comments: Independent however has required more assistance 3 days PTA due to  back pain; did sponges bathes at times due to anxiety of showering alone. [employer] on Wheels; son helps with groceries and appointments. Has been receiving HHPT @ 4 weeks PTA        OT Problem List: Decreased activity tolerance;Impaired balance (sitting and/or standing);Decreased coordination;Decreased safety awareness;Decreased knowledge of precautions;Decreased knowledge of use of DME or AE      OT Treatment/Interventions: Self-care/ADL training;Energy conservation;Therapeutic exercise;DME and/or AE  instruction;Therapeutic activities;Patient/family education;Balance training    OT Goals(Current goals can be found in the care plan section) Acute Rehab OT Goals Patient Stated Goal: to get better OT Goal Formulation: With patient Time For Goal Achievement: 01/24/23 Potential to Achieve Goals: Fair  OT Frequency: Min 2X/week       AM-PAC OT "6 Clicks" Daily Activity     Outcome Measure Help from another person eating meals?: None Help from another person taking care of personal grooming?: A Little Help from another person toileting, which includes using toliet, bedpan, or urinal?: A Lot Help from another person bathing (including washing, rinsing, drying)?: A Lot Help from another person to put on and taking off regular upper body clothing?: A Little Help from another person to put on and taking off regular lower body clothing?: A Lot 6 Click Score: 16   End of Session Equipment Utilized During Treatment: Gait belt;Rolling walker (2 wheels);Other (comment);Right knee immobilizer (LSO)  Activity Tolerance: Patient tolerated treatment well;Patient limited by pain Patient left: in chair;with call bell/phone within reach;with family/visitor present  OT Visit Diagnosis: Unsteadiness on feet (R26.81);Other abnormalities of gait and mobility (R26.89);Muscle weakness (generalized) (M62.81);Pain                Time: 9528-4132 OT Time Calculation (min): 23 min Charges:  OT General Charges $OT Visit: 1 Visit OT Evaluation $OT Eval Low Complexity: 1 Low OT Treatments $Self Care/Home Management : 8-22 mins  Rosalio Loud, MS Acute Rehabilitation Department Office# [phone]   Selinda Flavin 01/10/2023, 4:57 PM

## 2023-01-10 NOTE — Assessment & Plan Note (Signed)
Ongoing severe left ankle pain that preceded the patient's fall Plain film imaging of the ankle performed on admission revealed no evidence of fracture Due to persisting pain, obtaining CT imaging of the joint Uric acid level has been obtained and is elevated however there is no obvious physical exam evidence of gout Will consider trial of colchicine if CT reveals no evidence of fracture.

## 2023-01-10 NOTE — Progress Notes (Signed)
Orthopedic Tech Progress Note Patient Details:  Nakendra Cristofaro Hellinger [birth year] [id number]  Ortho Devices Type of Ortho Device: Knee Immobilizer, Lumbar corsett Ortho Device/Splint Location: RLE Ortho Device/Splint Interventions: Ordered   Post Interventions Patient Tolerated: Well Instructions Provided: Care of device, Adjustment of device Ortho ordered an LSO, and after viewing xray readings it is more appropriate than the TLSO that was also ordered. Left at bedside as it is OOB only. Darleen Crocker 01/10/2023, 9:33 AM

## 2023-01-10 NOTE — TOC Initial Note (Addendum)
Transition of Care [facility]) - Initial/Assessment Note    Patient Details  Name: [name] MRN: [id number] Date of Birth: 1934/11/15  Transition of Care [facility]) CM/SW Contact:    Amada Jupiter, LCSW Phone Number: 01/10/2023, 1:31 PM  Clinical Narrative:                 Met with pt and son today to review recommendation for SNF.  Pt confirms that she was recently at [facility] - and discharged home on 4/15.  We discussed that she likely has just a few days remaining of insurance coverage and will need to private pay.  Pt and son understand this but feel that she cannot safely dc directly home.  Will start SNF  bed seach and have left a VM for [facility] to discuss pt's possible return.  1425 ADDENDUM: Pt has accepted SNF bed at [facility] who can admit pt tomorrow.  Have begun insurance authorization.  Expected Discharge Plan: Skilled Nursing Facility Barriers to Discharge: Continued Medical Work up, English as a second language teacher, SNF Pending bed offer   Patient Goals and CMS Choice Patient states their goals for this hospitalization and ongoing recovery are:: return to rehab if possible and then home          Expected Discharge Plan and Services In-house Referral: Clinical Social Work     Living arrangements for the past 2 months: Single Family Home                                      Prior Living Arrangements/Services Living arrangements for the past 2 months: Single Family Home Lives with:: Self Patient language and need for interpreter reviewed:: Yes Do you feel safe going back to the place where you live?: Yes      Need for Family Participation in Patient Care: Yes (Comment) Care giver support system in place?: No (comment)   Criminal Activity/Legal Involvement Pertinent to Current Situation/Hospitalization: No - Comment as needed  Activities of Daily Living Home Assistive Devices/Equipment: Blood pressure cuff, Eyeglasses, Dentures (specify type),  Shower chair with back, Walker (specify type), Wheelchair ADL Screening (condition at time of admission) Patient's cognitive ability adequate to safely complete daily activities?: Yes Is the patient deaf or have difficulty hearing?: No Does the patient have difficulty seeing, even when wearing glasses/contacts?: No Does the patient have difficulty concentrating, remembering, or making decisions?: No Patient able to express need for assistance with ADLs?: Yes Does the patient have difficulty dressing or bathing?: No Independently performs ADLs?: No Communication: Independent Dressing (OT): Independent Grooming: Independent Feeding: Independent Bathing: Needs assistance Is this a change from baseline?: Pre-admission baseline Toileting: Needs assistance Is this a change from baseline?: Pre-admission baseline In/Out Bed: Needs assistance Is this a change from baseline?: Pre-admission baseline Walks in Home: Needs assistance Is this a change from baseline?: Pre-admission baseline Does the patient have difficulty walking or climbing stairs?: Yes Weakness of Legs: Both Weakness of Arms/Hands: None  Permission Sought/Granted Permission sought to share information with : Family Supports, Magazine features editor Permission granted to share information with : Yes, Verbal Permission Granted  Share Information with NAME: son, Berania Blumenshine @ [phone]  Permission granted to share info w AGENCY: SNFs        Emotional Assessment Appearance:: Appears stated age Attitude/Demeanor/Rapport: Gracious Affect (typically observed): Accepting, Pleasant Orientation: : Oriented to Self, Oriented to Place, Oriented to  Time, Oriented to Situation Alcohol / Substance Use: Not Applicable Psych Involvement: No (comment)  Admission diagnosis:  Fall, initial encounter [W19.XXXA] Closed compression fracture of L5 lumbar vertebra, initial encounter (HCC) [S32.050A] Closed fracture of head of right  fibula, initial encounter [S82.831A] Closed compression fracture of L5 vertebra (HCC) [S32.050A] Patient Active Problem List   Diagnosis Date Noted   Closed disp comminuted fracture of shaft of right fibula with malunion 01/09/2023   Fall 01/09/2023   GAD (generalized anxiety disorder) 01/09/2023   Anemia of chronic disease 01/09/2023   Closed fracture of head of right fibula 01/09/2023   Closed right fibular fracture 01/09/2023   Vitamin D deficiency 01/09/2023   Closed compression fracture of L5 lumbar vertebra, initial encounter (HCC) 01/08/2023   Hyponatremia 11/18/2022   Lower back pain 11/18/2022   Normocytic anemia 11/18/2022   Closed compression fracture of L3 lumbar vertebra, initial encounter (HCC) 11/17/2022   Acute bacterial sinusitis 10/27/2022   Abnormal results on imaging study of genitourinary system 03/25/2022   NSTEMI (non-ST elevated myocardial infarction) (HCC)    Persistent atrial fibrillation (HCC) 03/15/2022   Chronic kidney disease, stage 3b (HCC) 03/15/2022   Elevated troponin 03/15/2022   Postmenopausal bleeding 06/05/2020   Chest pain 07/23/2019   Diverticulitis large intestine w/o perforation or abscess w/o bleeding 09/04/2018   Colovesical fistula    Preoperative clearance 08/07/2018   TIA (transient ischemic attack) 01/06/2018   Breast cancer (HCC) 09/13/2017   Obesity, unspecified 09/13/2017   Warfarin anticoagulation 09/13/2017   Uterine mass 09/07/2017   Adnexal mass 08/03/2017   Pelvic abscess in female 07/07/2017   Aortic stenosis    Dyslipidemia, goal LDL below 70 11/28/2016   Monitoring for long-term anticoagulant use 12/31/2015   Chronic diastolic CHF (congestive heart failure) (HCC) 10/28/2015   Insomnia 10/23/2015   Leg swelling 08/11/2015   Takotsubo syndrome 07/29/2015   DOE (dyspnea on exertion) 08/15/2014   Dilated aortic root (HCC)    Pulmonary HTN (HCC)    Permanent atrial fibrillation (HCC) 07/04/2013   Chronic  anticoagulation 07/04/2013   Coronary artery disease    Lesion of nipple 05/15/2013   GERD (gastroesophageal reflux disease)    Essential hypertension    COPD (chronic obstructive pulmonary disease) (HCC)    Osteopenia    Diverticulosis    Hiatal hernia    Abdominal pain 01/11/2012   Breast cancer of upper-outer quadrant of left female breast (HCC) 01/06/2012   Colon cancer (HCC) 01/06/2012   PCP:  Donita Brooks, MD Pharmacy:   [facility] - [place], [place] - [address] [place] [address] Phone: [phone] Fax: [phone]  [id number] - [place], [place] - [address] [place] [address] Phone: [phone] Fax: [phone]     Social Determinants of Health (SDOH) Social History: SDOH Screenings   Food Insecurity: No Food Insecurity (01/09/2023)  Housing: Low Risk  (01/09/2023)  Transportation Needs: No Transportation Needs (01/09/2023)  Utilities: Not At Risk (01/09/2023)  Alcohol Screen: Low Risk  (11/08/2022)  Depression (PHQ2-9): Low Risk  (12/16/2022)  Financial Resource Strain: Low Risk  (11/08/2022)  Recent Concern: Financial Resource Strain - Medium Risk (11/04/2022)  Physical Activity: Sufficiently Active (11/08/2022)  Recent Concern: Physical Activity - Insufficiently Active (08/11/2022)  Social Connections: Moderately Integrated (11/08/2022)  Recent Concern: Social Connections - Moderately Isolated (08/11/2022)  Stress: No Stress Concern Present (11/08/2022)  Tobacco Use: Low Risk  (01/10/2023)   SDOH Interventions:  Readmission Risk Interventions     No data to display

## 2023-01-11 ENCOUNTER — Observation Stay (HOSPITAL_COMMUNITY): Payer: Medicare Other

## 2023-01-11 DIAGNOSIS — S82421P Displaced transverse fracture of shaft of right fibula, subsequent encounter for closed fracture with malunion: Secondary | ICD-10-CM

## 2023-01-11 DIAGNOSIS — S32040A Wedge compression fracture of fourth lumbar vertebra, initial encounter for closed fracture: Secondary | ICD-10-CM | POA: Diagnosis not present

## 2023-01-11 DIAGNOSIS — M25572 Pain in left ankle and joints of left foot: Secondary | ICD-10-CM

## 2023-01-11 DIAGNOSIS — M545 Low back pain, unspecified: Secondary | ICD-10-CM | POA: Diagnosis not present

## 2023-01-11 DIAGNOSIS — E875 Hyperkalemia: Secondary | ICD-10-CM

## 2023-01-11 DIAGNOSIS — D62 Acute posthemorrhagic anemia: Secondary | ICD-10-CM

## 2023-01-11 DIAGNOSIS — S32050A Wedge compression fracture of fifth lumbar vertebra, initial encounter for closed fracture: Secondary | ICD-10-CM | POA: Diagnosis not present

## 2023-01-11 LAB — CBC WITH DIFFERENTIAL/PLATELET
Abs Immature Granulocytes: 0.03 10*3/uL (ref 0.00–0.07)
Basophils Absolute: 0 10*3/uL (ref 0.0–0.1)
Basophils Relative: 0 %
Eosinophils Absolute: 0.2 10*3/uL (ref 0.0–0.5)
Eosinophils Relative: 3 %
HCT: 25.5 % — ABNORMAL LOW (ref 36.0–46.0)
Hemoglobin: 8.1 g/dL — ABNORMAL LOW (ref 12.0–15.0)
Immature Granulocytes: 1 %
Lymphocytes Relative: 26 %
Lymphs Abs: 1.7 10*3/uL (ref 0.7–4.0)
MCH: 30.5 pg (ref 26.0–34.0)
MCHC: 31.8 g/dL (ref 30.0–36.0)
MCV: 95.9 fL (ref 80.0–100.0)
Monocytes Absolute: 0.8 10*3/uL (ref 0.1–1.0)
Monocytes Relative: 12 %
Neutro Abs: 3.8 10*3/uL (ref 1.7–7.7)
Neutrophils Relative %: 58 %
Platelets: 178 10*3/uL (ref 150–400)
RBC: 2.66 MIL/uL — ABNORMAL LOW (ref 3.87–5.11)
RDW: 14.1 % (ref 11.5–15.5)
WBC: 6.5 10*3/uL (ref 4.0–10.5)
nRBC: 0 % (ref 0.0–0.2)

## 2023-01-11 LAB — COMPREHENSIVE METABOLIC PANEL
ALT: 21 U/L (ref 0–44)
AST: 16 U/L (ref 15–41)
Albumin: 2.8 g/dL — ABNORMAL LOW (ref 3.5–5.0)
Alkaline Phosphatase: 72 U/L (ref 38–126)
Anion gap: 6 (ref 5–15)
BUN: 45 mg/dL — ABNORMAL HIGH (ref 8–23)
CO2: 31 mmol/L (ref 22–32)
Calcium: 8.2 mg/dL — ABNORMAL LOW (ref 8.9–10.3)
Chloride: 90 mmol/L — ABNORMAL LOW (ref 98–111)
Creatinine, Ser: 1.33 mg/dL — ABNORMAL HIGH (ref 0.44–1.00)
GFR, Estimated: 39 mL/min — ABNORMAL LOW (ref >60)
Glucose, Bld: 118 mg/dL — ABNORMAL HIGH (ref 70–99)
Potassium: 5.5 mmol/L — ABNORMAL HIGH (ref 3.5–5.1)
Sodium: 127 mmol/L — ABNORMAL LOW (ref 135–145)
Total Bilirubin: 0.6 mg/dL (ref 0.3–1.2)
Total Protein: 5.5 g/dL — ABNORMAL LOW (ref 6.5–8.1)

## 2023-01-11 LAB — MAGNESIUM: Magnesium: 3 mg/dL — ABNORMAL HIGH (ref 1.7–2.4)

## 2023-01-11 LAB — CBC
HCT: 27.9 % — ABNORMAL LOW (ref 36.0–46.0)
Hemoglobin: 8.8 g/dL — ABNORMAL LOW (ref 12.0–15.0)
MCH: 30.7 pg (ref 26.0–34.0)
MCHC: 31.5 g/dL (ref 30.0–36.0)
MCV: 97.2 fL (ref 80.0–100.0)
Platelets: 203 10*3/uL (ref 150–400)
RBC: 2.87 MIL/uL — ABNORMAL LOW (ref 3.87–5.11)
RDW: 14.2 % (ref 11.5–15.5)
WBC: 7.7 10*3/uL (ref 4.0–10.5)
nRBC: 0 % (ref 0.0–0.2)

## 2023-01-11 LAB — IRON AND TIBC
Iron: 27 ug/dL — ABNORMAL LOW (ref 28–170)
Saturation Ratios: 11 % (ref 10.4–31.8)
TIBC: 244 ug/dL — ABNORMAL LOW (ref 250–450)
UIBC: 217 ug/dL

## 2023-01-11 LAB — BASIC METABOLIC PANEL
Anion gap: 9 (ref 5–15)
BUN: 41 mg/dL — ABNORMAL HIGH (ref 8–23)
CO2: 28 mmol/L (ref 22–32)
Calcium: 8.4 mg/dL — ABNORMAL LOW (ref 8.9–10.3)
Chloride: 89 mmol/L — ABNORMAL LOW (ref 98–111)
Creatinine, Ser: 1.34 mg/dL — ABNORMAL HIGH (ref 0.44–1.00)
GFR, Estimated: 39 mL/min — ABNORMAL LOW (ref >60)
Glucose, Bld: 133 mg/dL — ABNORMAL HIGH (ref 70–99)
Potassium: 5.7 mmol/L — ABNORMAL HIGH (ref 3.5–5.1)
Sodium: 126 mmol/L — ABNORMAL LOW (ref 135–145)

## 2023-01-11 LAB — HEMOGLOBIN A1C
Hgb A1c MFr Bld: 5.6 % (ref 4.8–5.6)
Mean Plasma Glucose: 114.02 mg/dL

## 2023-01-11 LAB — FERRITIN: Ferritin: 119 ng/mL (ref 11–307)

## 2023-01-11 MED ORDER — SODIUM ZIRCONIUM CYCLOSILICATE 10 G PO PACK
10.0000 g | PACK | Freq: Once | ORAL | Status: AC
  Administered 2023-01-11: 10 g via ORAL
  Filled 2023-01-11: qty 1

## 2023-01-11 MED ORDER — INSULIN ASPART 100 UNIT/ML IJ SOLN
5.0000 [IU] | Freq: Once | INTRAMUSCULAR | Status: AC
  Administered 2023-01-11: 5 [IU] via SUBCUTANEOUS

## 2023-01-11 MED ORDER — SODIUM CHLORIDE 0.9 % IV SOLN: INTRAVENOUS | Status: DC

## 2023-01-11 MED ORDER — COLCHICINE 0.6 MG PO TABS
1.2000 mg | ORAL_TABLET | Freq: Once | ORAL | Status: AC
  Administered 2023-01-11: 1.2 mg via ORAL
  Filled 2023-01-11: qty 2

## 2023-01-11 MED ORDER — DEXTROSE 50 % IV SOLN
1.0000 | Freq: Once | INTRAVENOUS | Status: AC
  Administered 2023-01-11: 50 mL via INTRAVENOUS
  Filled 2023-01-11: qty 50

## 2023-01-11 MED ORDER — SIMETHICONE 80 MG PO CHEW
80.0000 mg | CHEWABLE_TABLET | Freq: Once | ORAL | Status: AC
  Administered 2023-01-11: 80 mg via ORAL
  Filled 2023-01-11: qty 1

## 2023-01-11 MED ORDER — PANTOPRAZOLE SODIUM 40 MG IV SOLR
40.0000 mg | Freq: Two times a day (BID) | INTRAVENOUS | Status: DC
  Administered 2023-01-11 – 2023-01-16 (×11): 40 mg via INTRAVENOUS
  Filled 2023-01-11 (×11): qty 10

## 2023-01-11 MED ORDER — POLYETHYLENE GLYCOL 3350 17 G PO PACK: 17.0000 g | PACK | Freq: Every day | ORAL | Status: DC | PRN

## 2023-01-11 MED ORDER — TORSEMIDE 20 MG PO TABS
40.0000 mg | ORAL_TABLET | Freq: Every day | ORAL | Status: DC
  Filled 2023-01-11: qty 2

## 2023-01-11 MED ORDER — SODIUM CHLORIDE 0.9 % IV BOLUS: 250.0000 mL | Freq: Once | INTRAVENOUS | Status: DC

## 2023-01-11 MED ORDER — TORSEMIDE 20 MG PO TABS: 40.0000 mg | ORAL_TABLET | Freq: Every day | ORAL | Status: DC

## 2023-01-11 NOTE — Assessment & Plan Note (Signed)
Recurrent hyperkalemia of unknown etiology Patient also developed hyperkalemia several weeks ago and at that time cardiology had discontinued her spironolactone Hyperkalemia in the setting of rising BUN, dropping hemoglobin and hematocrit and reports of black stool being about concern for upper gastrointestinal bleeding. Administering Lokelma, D50, insulin and gentle intravenous fluids

## 2023-01-11 NOTE — Progress Notes (Signed)
Subjective:    Patient reports pain as 4 on 0-10 scale.   Denies CP or SOB.  Voiding without difficulty. Positive flatus. Objective: Vital signs in last 24 hours: Temp:  [97.7 F (36.5 C)-97.8 F (36.6 C)] 97.7 F (36.5 C) (05/07 0553) Pulse Rate:  [55-56] 56 (05/07 0553) Resp:  [17-18] 17 (05/07 0553) BP: (144-165)/(54-58) 144/56 (05/07 0553) SpO2:  [94 %-96 %] 96 % (05/07 0553)  Intake/Output from previous day: 05/06 0701 - 05/07 0700 In: 570 [P.O.:570] Out: -  Intake/Output this shift: No intake/output data recorded.  Recent Labs    01/08/23 2233 01/09/23 0325 01/10/23 0308 01/11/23 0316  HGB 9.4* 8.7* 8.4* 8.1*   Recent Labs    01/10/23 0308 01/11/23 0316  WBC 7.6 6.5  RBC 2.70* 2.66*  HCT 26.4* 25.5*  PLT 187 178   Recent Labs    01/10/23 0308 01/11/23 0316  NA 129* 127*  K 4.5 5.5*  CL 89* 90*  CO2 33* 31  BUN 38* 45*  CREATININE 1.54* 1.33*  GLUCOSE 136* 118*  CALCIUM 8.3* 8.2*   No results for input(s): "LABPT", "INR" in the last 72 hours.  Neurologically intact ABD soft Sensation intact distally Intact pulses distally Dorsiflexion/Plantar flexion intact Compartment soft  MRI lumbar spine confirms the L5 acute.  Plate fracture with 20% height loss and no retropulsion.  L4 compression fracture status post cement augmentation with persistent diffuse marrow edema no progressive height loss.  Nearly healed L3 compression fracture status post vertebroplasty.  Chronic unchanged L1 and L2 compression deformities.  Moderate spinal stenosis which is worse in the L4-5 with a intraspinal synovial cyst on the right.    CT scan of the left ankle demonstrates no evidence of a fracture  Assessment/Plan:    1.  Acute endplate fracture and compression fracture of L5 without retropulsion.  Patient neurologically intact 2.  Healing L3-L4 compression fractures status post vertebroplasty's 3.  Progressive spinal stenosis moderate to moderately severe at L4-5  with a synovial cyst L4-5 to the right 4.  Nondisplaced right fibular head fracture   She can be out of bed with her LSO brace on.  Knee immobilizer that can be taken off while in bed and when sitting.  But should be on when she is ambulating.  Weightbearing as tolerated in the lower extremities. Advance diet Up with therapy Discharge to SNF per primary services.  Cleared from an orthopedic standpoint  Follow-up with Dr. Shelle Iron 2 weeks for x-rays of her lumbar spine and of her right knee.   Principal Problem:   Closed compression fracture of L5 lumbar vertebra, initial encounter [facility]) Active Problems:   Essential hypertension   Permanent atrial fibrillation (HCC)   Chronic diastolic CHF (congestive heart failure) (HCC)   Persistent atrial fibrillation (HCC)   Hyponatremia   Closed disp comminuted fracture of shaft of right fibula with malunion   Fall   GAD (generalized anxiety disorder)   Anemia of chronic disease   Closed fracture of head of right fibula   Closed right fibular fracture   Vitamin D deficiency   Left ankle pain      [name] 01/11/2023, @NOW 

## 2023-01-11 NOTE — Progress Notes (Signed)
PROGRESS NOTE   [name]  OZH:[id number] DOB: May 22, 1935 DOA: 01/08/2023 PCP: Donita Brooks, MD   Date of Service: the patient was seen and examined on 01/11/2023  Brief Narrative:  88 year old female with past medical history of coronary artery disease (cath 12/2020 with nonobstructive CAD), L3 and L4 kyphoplasty (11/2022), mild/moderate aortic stenosis (AVA 1.42cm2 via Echo 03/2022), chronic hyponatremia, permanent atrial fibrillation on Eliquis, diastolic congestive heart failure with Takotsubo cardiomyopathy in the past, hypertension, anemia of chronic disease presenting to [facility] emergency department with complaints of fall after tripping and low back pain.  Upon evaluation in the emergency department trauma evaluation included a CTA scan of the right femur revealing an acute nondisplaced fracture across the fibular head and neck junction along with CT imaging of the pelvis identifying an L5 superior endplate central compression fracture with slight posterior superior retropulsion and fragmentation.  EDP discussed case with Dr. Shon Baton with orthopedic surgery who agreed to consult.  The hospitalist group was then called to assess the patient for admission to the hospital.  Dr. Shon Baton recommended TLSO bracing and conservative measures for management of the compression fracture.  They additionally felt that the right fibular head fracture likely also did not require surgical intervention.    Hospital course was complicated by development of hyperkalemia on hospital day 3 requiring gentle hydration, multiple doses of Lokelma as well as dextrose and insulin.  Hospital course was also complicated by development of progressive anemia, prompting intravenous Protonix and GI consultation.   Assessment and Plan: * Closed compression fracture of L5 lumbar vertebra, initial encounter (HCC) Improving pain on current analgesic regimen including twice daily Tylenol, intranasal calcitonin and  as needed opiate-based analgesics TLSO brace has been ordered per orthopedic surgery recommendations MRI lumbar spine ordered and pending per orthopedic surgery Vitamin D found to be deficient, initiated on ergocalciferol 50,000 units q. weekly. PT evaluation performed, recommending skilled physical therapy services in a skilled nursing facility   Closed right fibular fracture Conservative management as this is not weightbearing Knee immobilizer per orthopedic surgery recommendations which have been ordered  Acute blood loss anemia Downtrending hemoglobin to 8.1 this morning from 10.4 three weeks ago in the setting of rising BUN and recurrent hyperkalemia Patient reports ongoing black stools although patient is currently on iron therapy Epigastric tenderness on physical exam Patient is on Eliquis for atrial fibrillation.  Will temporarily hold Eliquis for now Will initiate patient on Protonix 40 mg intravenously every 12 hours Monitoring hemoglobin and hematocrit with serial CBCs Stool Hemoccult Consider transfusion if hemoglobin drops below 7 Obtaining gastroenterology consultation for consideration of endoscopic evaluation.  Patient [place] simply benefit from conservative management.  Hyperkalemia Recurrent hyperkalemia of unknown etiology Patient also developed hyperkalemia several weeks ago and at that time cardiology had discontinued her spironolactone Hyperkalemia in the setting of rising BUN, dropping hemoglobin and hematocrit and reports of black stool being about concern for upper gastrointestinal bleeding. Administering Lokelma, D50, insulin and gentle intravenous fluids  Hyponatremia Reportedly "chronic" hyponatremia however baseline sodium after chart review reveals baseline sodium in the low 130s Sodium is currently lower than baseline, with concurrent hypochloremia suggesting volume depletion  Etiology is likely due to patient being on too much torsemide will hold torsemide  for 24 hours, provide patient with some gentle intravenous hydration and when torsemide is resumed that should likely be reduced to once daily instead of twice daily. Furthermore I cannot find any evidence of a prior appropriate hyponatremia workup.  Obtaining urine sodium, urine osmolality, serum osmolality and cortisol  Left ankle pain Ongoing severe left ankle pain that preceded the patient's fall seems to have been increasing as of late Plain film imaging of the ankle performed on admission revealed no evidence of fracture CT imaging additionally reveals no evidence of fracture Uric acid level has been obtained and is elevated.  There is no obvious physical exam evidence of gout however considering negative imaging and markedly elevated uric acid we will try trial of colchicine and reassess   Persistent atrial fibrillation (HCC) Continue home regimen of Eliquis Rate controlled  Chronic diastolic CHF (congestive heart failure) (HCC) No clinical evidence of cardiogenic volume overload Strict input and output monitoring Daily weights Low-sodium diet   Essential hypertension Continue home regimen of clonidine  Vitamin D deficiency Low vitamin D of 26.24 on this hospitalization despite outpatient supplementation Initiating ergocalciferol 50,000 units q. weekly.    Subjective:  Patient reports longstanding history of intermittent melena "sometimes mixed with red blood."  Patient complains of associated epigastric pain.  Patient also complaining of ongoing low back pain, slightly improved since yesterday.  Patient is additional complaining of generalized weakness and lightheadedness.  Patient does also complain of ongoing left ankle pain.  Physical Exam:  Vitals:   01/10/23 2028 01/11/23 0553 01/11/23 1435 01/11/23 1928  BP: (!) 165/58 (!) 144/56 (!) 168/44   Pulse: (!) 55 (!) 56 (!) 58   Resp: 17 17 18    Temp: 97.7 F (36.5 C) 97.7 F (36.5 C) 98 F (36.7 C)   TempSrc: Oral  Oral Oral   SpO2: 96% 96% 95% 95%  Weight:      Height:        Constitutional: Awake alert and oriented x3, no associated distress.   Skin: no rashes, no lesions, good skin turgor noted. Eyes: Pupils are equally reactive to light.  No evidence of scleral icterus or conjunctival pallor.  ENMT: Moist mucous membranes noted.  Posterior pharynx clear of any exudate or lesions.   Respiratory: clear to auscultation bilaterally, no wheezing, no crackles. Normal respiratory effort. No accessory muscle use.  Cardiovascular: Regular rate and rhythm, no murmurs / rubs / gallops. No extremity edema. 2+ pedal pulses. No carotid bruits.  Abdomen: Abdomen is soft and nontender.  No evidence of intra-abdominal masses.  Positive bowel sounds noted in all quadrants.   Musculoskeletal: Pain with both passive and active range of motion of the left ankle Back: Midline lumbar tenderness on palpation without crepitus   Data Reviewed:  I have personally reviewed and interpreted labs, imaging.  Significant findings are   CBC: Recent Labs  Lab 01/08/23 2233 01/09/23 0325 01/10/23 0308 01/11/23 0316 01/11/23 1738  WBC 8.1 6.9 7.6 6.5 7.7  NEUTROABS  --  4.6 5.2 3.8  --   HGB 9.4* 8.7* 8.4* 8.1* 8.8*  HCT 29.2* 27.1* 26.4* 25.5* 27.9*  MCV 95.7 96.4 97.8 95.9 97.2  PLT 233 206 187 178 203   Basic Metabolic Panel: Recent Labs  Lab 01/08/23 2233 01/09/23 0325 01/09/23 0326 01/10/23 0308 01/11/23 0316 01/11/23 1738  NA 131* 132*  --  129* 127* 126*  K 4.3 4.3  --  4.5 5.5* 5.7*  CL 92* 94*  --  89* 90* 89*  CO2 30 31  --  33* 31 28  GLUCOSE 154* 122*  --  136* 118* 133*  BUN 35* 36*  --  38* 45* 41*  CREATININE 1.13* 1.13*  --  1.54* 1.33*  1.34*  CALCIUM 8.7* 8.7*  --  8.3* 8.2* 8.4*  MG  --  2.8* 2.8* 3.2* 3.0*  --    GFR: Estimated Creatinine Clearance: 30.2 mL/min (A) (by C-G formula based on SCr of 1.34 mg/dL (H)). Liver Function Tests: Recent Labs  Lab 01/09/23 0325 01/10/23 0308  01/11/23 0316  AST 22 20 16   ALT 26 26 21   ALKPHOS 68 72 72  BILITOT 0.7 0.7 0.6  PROT 5.9* 5.9* 5.5*  ALBUMIN 3.0* 2.9* 2.8*     Code Status:  Full code.  Code status decision has been confirmed with: patient Family Communication: Son is at the bedside who has been updated on plan of care   Severity of Illness:  The appropriate patient status for this patient is OBSERVATION. Observation status is judged to be reasonable and necessary in order to provide the required intensity of service to ensure the patient's safety. The patient's presenting symptoms, physical exam findings, and initial radiographic and laboratory data in the context of their medical condition is felt to place them at decreased risk for further clinical deterioration. Furthermore, it is anticipated that the patient will be medically stable for discharge from the hospital within 2 midnights of admission.   Time spent:  59 minutes  Author:  Marinda Elk MD  01/11/2023 8:31 PM

## 2023-01-11 NOTE — Assessment & Plan Note (Signed)
Downtrending hemoglobin to 8.1 this morning from 10.4 three weeks ago in the setting of rising BUN and recurrent hyperkalemia Patient reports ongoing black stools although patient is currently on iron therapy Epigastric tenderness on physical exam Patient is on Eliquis for atrial fibrillation.  Will temporarily hold Eliquis for now Will initiate patient on Protonix 40 mg intravenously every 12 hours Monitoring hemoglobin and hematocrit with serial CBCs Stool Hemoccult Consider transfusion if hemoglobin drops below 7 Obtaining gastroenterology consultation for consideration of endoscopic evaluation.  [name] simply benefit from conservative management.

## 2023-01-12 ENCOUNTER — Encounter (HOSPITAL_COMMUNITY): Payer: Self-pay | Admitting: Internal Medicine

## 2023-01-12 DIAGNOSIS — S32050A Wedge compression fracture of fifth lumbar vertebra, initial encounter for closed fracture: Secondary | ICD-10-CM | POA: Diagnosis not present

## 2023-01-12 LAB — CBC WITH DIFFERENTIAL/PLATELET
Abs Immature Granulocytes: 0.04 10*3/uL (ref 0.00–0.07)
Basophils Absolute: 0 10*3/uL (ref 0.0–0.1)
Basophils Relative: 0 %
Eosinophils Absolute: 0.1 10*3/uL (ref 0.0–0.5)
Eosinophils Relative: 2 %
HCT: 24.7 % — ABNORMAL LOW (ref 36.0–46.0)
Hemoglobin: 7.9 g/dL — ABNORMAL LOW (ref 12.0–15.0)
Immature Granulocytes: 1 %
Lymphocytes Relative: 22 %
Lymphs Abs: 1.4 10*3/uL (ref 0.7–4.0)
MCH: 30.5 pg (ref 26.0–34.0)
MCHC: 32 g/dL (ref 30.0–36.0)
MCV: 95.4 fL (ref 80.0–100.0)
Monocytes Absolute: 0.9 10*3/uL (ref 0.1–1.0)
Monocytes Relative: 14 %
Neutro Abs: 4 10*3/uL (ref 1.7–7.7)
Neutrophils Relative %: 61 %
Platelets: 178 10*3/uL (ref 150–400)
RBC: 2.59 MIL/uL — ABNORMAL LOW (ref 3.87–5.11)
RDW: 14.1 % (ref 11.5–15.5)
WBC: 6.5 10*3/uL (ref 4.0–10.5)
nRBC: 0 % (ref 0.0–0.2)

## 2023-01-12 LAB — COMPREHENSIVE METABOLIC PANEL
ALT: 18 U/L (ref 0–44)
AST: 13 U/L — ABNORMAL LOW (ref 15–41)
Albumin: 2.7 g/dL — ABNORMAL LOW (ref 3.5–5.0)
Alkaline Phosphatase: 65 U/L (ref 38–126)
Anion gap: 7 (ref 5–15)
BUN: 40 mg/dL — ABNORMAL HIGH (ref 8–23)
CO2: 28 mmol/L (ref 22–32)
Calcium: 8 mg/dL — ABNORMAL LOW (ref 8.9–10.3)
Chloride: 88 mmol/L — ABNORMAL LOW (ref 98–111)
Creatinine, Ser: 1.32 mg/dL — ABNORMAL HIGH (ref 0.44–1.00)
GFR, Estimated: 39 mL/min — ABNORMAL LOW (ref >60)
Glucose, Bld: 94 mg/dL (ref 70–99)
Potassium: 4.8 mmol/L (ref 3.5–5.1)
Sodium: 123 mmol/L — ABNORMAL LOW (ref 135–145)
Total Bilirubin: 0.6 mg/dL (ref 0.3–1.2)
Total Protein: 5.5 g/dL — ABNORMAL LOW (ref 6.5–8.1)

## 2023-01-12 LAB — OSMOLALITY: Osmolality: 276 mOsm/kg (ref 275–295)

## 2023-01-12 LAB — TYPE AND SCREEN: Antibody Screen: NEGATIVE

## 2023-01-12 LAB — HEMOGLOBIN AND HEMATOCRIT, BLOOD
HCT: 28.8 % — ABNORMAL LOW (ref 36.0–46.0)
Hemoglobin: 9.1 g/dL — ABNORMAL LOW (ref 12.0–15.0)

## 2023-01-12 LAB — SODIUM, URINE, RANDOM: Sodium, Ur: 22 mmol/L

## 2023-01-12 LAB — OCCULT BLOOD X 1 CARD TO LAB, STOOL: Fecal Occult Bld: POSITIVE — AB

## 2023-01-12 LAB — SODIUM: Sodium: 125 mmol/L — ABNORMAL LOW (ref 135–145)

## 2023-01-12 LAB — CORTISOL: Cortisol, Plasma: 7.1 ug/dL

## 2023-01-12 LAB — MAGNESIUM: Magnesium: 2.8 mg/dL — ABNORMAL HIGH (ref 1.7–2.4)

## 2023-01-12 LAB — OSMOLALITY, URINE: Osmolality, Ur: 267 mOsm/kg — ABNORMAL LOW (ref 300–900)

## 2023-01-12 MED ORDER — POLYETHYLENE GLYCOL 3350 17 G PO PACK
17.0000 g | PACK | Freq: Two times a day (BID) | ORAL | Status: DC
  Administered 2023-01-12 – 2023-01-17 (×7): 17 g via ORAL
  Filled 2023-01-12 (×10): qty 1

## 2023-01-12 MED ORDER — SODIUM CHLORIDE 0.9 % IV SOLN: INTRAVENOUS | Status: DC

## 2023-01-12 NOTE — Consult Note (Addendum)
Reason for Consult: Anemia Referring Physician: Hospital team  [name] is an 88 y.o. female.  HPI: Patient seen and examined and discussed with her son and her hospital computer chart reviewed and she does have some upper tract symptoms including some dysphagia and Budde have had a barium swallow years ago that I cannot find in the computer but no recent endoscopy and her last colonoscopy was [place] have 16 and a CT in December was okay and she does have a remote history of colon cancer and her stools are black while she is on iron but she tends to be constipated and is on blood thinner and while she was in rehab did have some right-sided pain which comes and goes and possibly something that sounds like a hernia and she has no other complaints  Past Medical History:  Diagnosis Date   Allergy    rhinitis   Aortic stenosis    mild by echo 06/2017   Arthritis    Bradycardia    a. 10/2017 -> beta blocker cut back due to HR 39.   Breast cancer (HCC) 01/06/2012   Cancer (HCC)    right colon and left breast   Chronic diastolic CHF (congestive heart failure) (HCC)    Colon cancer (HCC) 01/06/2012   Colovesical fistula    Dr. Carolynne Edouard and Dr. Laverle Patter planning surgery 08/2018- surgery revealed spontaneous closure   COPD (chronic obstructive pulmonary disease) (HCC)    pt. denies   Coronary artery disease 2006   a.  NSTEMI in 2016, cath showed 15% prox-mid RCA, 20% prox LAD, EF 25-35% by cath and 35-40% -> felt due to Takotsubo cardiomyopathy.   Dilated aortic root (HCC)    by echo 06/2017   Diverticulosis    Dyspnea    Edema extremities    GERD (gastroesophageal reflux disease)    Hernia    Hiatal hernia    denies   Hyperlipidemia    Hypertension    Mild aortic stenosis    echo 11/2015 but not noted on echo 06/2016   Osteopenia    Permanent atrial fibrillation (HCC)    chronic atrial fibrillation   Pneumonia    hx child   Pulmonary HTN (HCC)    a. moderate to severe PASP echo  11/2015 - now by echo 06/2017. CTA chest in 11/16 with no PE. PFTs in 7/15 with mild obstructive lung disease. She had a negative sleep study in 2017. b. Felt due to left sided HF.   Stroke [facility])    Takotsubo syndrome 07/29/2015   a. EF 35-40% by echo; akinesis of mid-apical anteroseptal and apical myocardium.  EF now normalized on echo 11/2015    Past Surgical History:  Procedure Laterality Date   APPENDECTOMY     BREAST SURGERY     lumpectomy left   CARDIAC CATHETERIZATION     CARDIAC CATHETERIZATION N/A 07/28/2015   Procedure: Left Heart Cath and Coronary Angiography;  Surgeon: Peter M Swaziland, MD;  Location: [facility] INVASIVE CV LAB;  Service: Cardiovascular;  Laterality: N/A;   CHOLECYSTECTOMY     COLECTOMY     right side   CYSTOSCOPY WITH STENT PLACEMENT Left 09/04/2018   Procedure: CYSTOSCOPY WITH LEFT STENT PLACEMENT, BLADDER REPAIR, CYSTOSCOPY WITH LEFT STENT REMOVAL;  Surgeon: Heloise Purpura, MD;  Location: [facility] ORS;  Service: Urology;  Laterality: Left;   EXCISION OF ACCESSORY NIPPLE Bilateral 05/30/2013   Procedure: BILATERAL NIPPLE BIOPSY;  Surgeon: Robyne Askew, MD;  Location: [facility] OR;  Service: General;  Laterality: Bilateral;   EYE SURGERY Bilateral 12   cataracts   HYSTEROSCOPY WITH D & C N/A 05/29/2020   Procedure: DILATATION AND CURETTAGE /HYSTEROSCOPY;  Surgeon: Natale Milch, MD;  Location: [facility] ORS;  Service: Gynecology;  Laterality: N/A;   IR KYPHO EA ADDL LEVEL THORACIC OR LUMBAR  11/22/2022   IR KYPHO LUMBAR INC FX REDUCE BONE BX UNI/BIL CANNULATION INC/IMAGING  11/22/2022   IR RADIOLOGIST EVAL & MGMT  07/26/2017   LAPAROSCOPIC RIGHT COLECTOMY N/A 09/04/2018   Procedure: LAPAROSCOPIC ASSISTED SIGMOID COLECTOMY WITH REPAIR OF FISTULA TO BLADDER;  Surgeon: Griselda Miner, MD;  Location: [facility];  Service: General;  Laterality: N/A;   RIGHT/LEFT HEART CATH AND CORONARY ANGIOGRAPHY N/A 12/10/2020   Procedure: RIGHT/LEFT HEART CATH AND CORONARY ANGIOGRAPHY;  Surgeon:  Laurey Morale, MD;  Location: [facility] INVASIVE CV LAB;  Service: Cardiovascular;  Laterality: N/A;   SPLIT NIGHT STUDY  02/02/2016        Family History  Problem Relation Age of Onset   Heart disease Mother    Heart attack Mother    Cancer Sister        stomach and colon   Heart disease Brother 49   Hypertension Father    Cancer Sister    Stroke Neg Hx     Social History:  reports that she has never smoked. She has never been exposed to tobacco smoke. She has never used smokeless tobacco. She reports that she does not drink alcohol and does not use drugs.  Allergies:  Allergies  Allergen Reactions   Contrast Media [Iodinated Contrast Media] Hives and Other (See Comments)    Chest pain and burning    Clonidine Derivatives Other (See Comments)    Dry mouth, throat   Statins Other (See Comments)    Myalgias   Sulfa Antibiotics Diarrhea    Tremors    Celebrex [Celecoxib] Rash   Isordil [Isosorbide Nitrate] Itching and Rash   Tape Itching and Rash    Redness at application site Reaction is to paper and plastic tapes and EKG leads    Medications: I have reviewed the patient's current medications.  Results for orders placed or performed during the hospital encounter of 01/08/23 (from the past 48 hour(s))  Hemoglobin A1c     Status: None   Collection Time: 01/11/23  3:16 AM  Result Value Ref Range   Hgb A1c MFr Bld 5.6 4.8 - 5.6 %    Comment: (NOTE) Pre diabetes:          5.7%-6.4%  Diabetes:              >6.4%  Glycemic control for   <7.0% adults with diabetes    Mean Plasma Glucose 114.02 mg/dL    Comment: Performed at [facility], [address] [place], [place] [address]  Comprehensive metabolic panel     Status: Abnormal   Collection Time: 01/11/23  3:16 AM  Result Value Ref Range   Sodium 127 (L) 135 - 145 mmol/L   Potassium 5.5 (H) 3.5 - 5.1 mmol/L   Chloride 90 (L) 98 - 111 mmol/L   CO2 31 22 - 32 mmol/L   Glucose, Bld 118 (H) 70 - 99 mg/dL     Comment: Glucose reference range applies only to samples taken after fasting for at least 8 hours.   BUN 45 (H) 8 - 23 mg/dL   Creatinine, Ser 6.96 (H) 0.44 -  1.00 mg/dL   Calcium 8.2 (L) 8.9 - 10.3 mg/dL   Total Protein 5.5 (L) 6.5 - 8.1 g/dL   Albumin 2.8 (L) 3.5 - 5.0 g/dL   AST 16 15 - 41 U/L   ALT 21 0 - 44 U/L   Alkaline Phosphatase 72 38 - 126 U/L   Total Bilirubin 0.6 0.3 - 1.2 mg/dL   GFR, Estimated 39 (L) >60 mL/min    Comment: (NOTE) Calculated using the CKD-EPI Creatinine Equation (2021)    Anion gap 6 5 - 15    Comment: Performed at [facility], [address], [place], [place] [address]  CBC with Differential/Platelet     Status: Abnormal   Collection Time: 01/11/23  3:16 AM  Result Value Ref Range   WBC 6.5 4.0 - 10.5 K/uL   RBC 2.66 (L) 3.87 - 5.11 MIL/uL   Hemoglobin 8.1 (L) 12.0 - 15.0 g/dL   HCT 60.4 (L) 54.0 - 98.1 %   MCV 95.9 80.0 - 100.0 fL   MCH 30.5 26.0 - 34.0 pg   MCHC 31.8 30.0 - 36.0 g/dL   RDW 19.1 47.8 - 29.5 %   Platelets 178 150 - 400 K/uL   nRBC 0.0 0.0 - 0.2 %   Neutrophils Relative % 58 %   Neutro Abs 3.8 1.7 - 7.7 K/uL   Lymphocytes Relative 26 %   Lymphs Abs 1.7 0.7 - 4.0 K/uL   Monocytes Relative 12 %   Monocytes Absolute 0.8 0.1 - 1.0 K/uL   Eosinophils Relative 3 %   Eosinophils Absolute 0.2 0.0 - 0.5 K/uL   Basophils Relative 0 %   Basophils Absolute 0.0 0.0 - 0.1 K/uL   Immature Granulocytes 1 %   Abs Immature Granulocytes 0.03 0.00 - 0.07 K/uL    Comment: Performed at [facility], [address], [place], [place] [address]  Magnesium     Status: Abnormal   Collection Time: 01/11/23  3:16 AM  Result Value Ref Range   Magnesium 3.0 (H) 1.7 - 2.4 mg/dL    Comment: Performed at [facility], [address] [place], [place] [address]  Iron and TIBC     Status: Abnormal   Collection Time: 01/11/23  3:16 AM  Result Value Ref Range   Iron 27 (L) 28 - 170 ug/dL   TIBC 469  (L) 629 - 528 ug/dL   Saturation Ratios 11 10.4 - 31.8 %   UIBC 217 ug/dL    Comment: Performed at [facility], [address], [place], [place] [address]  Ferritin     Status: None   Collection Time: 01/11/23  3:16 AM  Result Value Ref Range   Ferritin 119 11 - 307 ng/mL    Comment: Performed at [facility], [address], [place], [place] [address]  Basic metabolic panel     Status: Abnormal   Collection Time: 01/11/23  5:38 PM  Result Value Ref Range   Sodium 126 (L) 135 - 145 mmol/L   Potassium 5.7 (H) 3.5 - 5.1 mmol/L   Chloride 89 (L) 98 - 111 mmol/L   CO2 28 22 - 32 mmol/L   Glucose, Bld 133 (H) 70 - 99 mg/dL    Comment: Glucose reference range applies only to samples taken after fasting for at least 8 hours.   BUN 41 (H) 8 - 23 mg/dL   Creatinine, Ser 7.25 (H) 0.44 - 1.00 mg/dL   Calcium  8.4 (L) 8.9 - 10.3 mg/dL   GFR, Estimated 39 (L) >60 mL/min    Comment: (NOTE) Calculated using the CKD-EPI Creatinine Equation (2021)    Anion gap 9 5 - 15    Comment: Performed at [facility], [address], [place], [place] [address]  CBC     Status: Abnormal   Collection Time: 01/11/23  5:38 PM  Result Value Ref Range   WBC 7.7 4.0 - 10.5 K/uL   RBC 2.87 (L) 3.87 - 5.11 MIL/uL   Hemoglobin 8.8 (L) 12.0 - 15.0 g/dL   HCT 60.4 (L) 54.0 - 98.1 %   MCV 97.2 80.0 - 100.0 fL   MCH 30.7 26.0 - 34.0 pg   MCHC 31.5 30.0 - 36.0 g/dL   RDW 19.1 47.8 - 29.5 %   Platelets 203 150 - 400 K/uL   nRBC 0.0 0.0 - 0.2 %    Comment: Performed at [facility], [address], [place], [place] [address]  Cortisol     Status: None   Collection Time: 01/12/23  1:03 AM  Result Value Ref Range   Cortisol, Plasma 7.1 ug/dL    Comment: (NOTE) AM    6.7 - 22.6 ug/dL PM   <86.5       ug/dL Performed at [facility], [name], [place], [place] [address]   Osmolality     Status: None   Collection Time: 01/12/23   1:03 AM  Result Value Ref Range   Osmolality 276 275 - 295 mOsm/kg    Comment: PERFORMED AT [facility] Performed at [facility], [address], [place], [place] [address]   CBC with Differential/Platelet     Status: Abnormal   Collection Time: 01/12/23  1:03 AM  Result Value Ref Range   WBC 6.5 4.0 - 10.5 K/uL   RBC 2.59 (L) 3.87 - 5.11 MIL/uL   Hemoglobin 7.9 (L) 12.0 - 15.0 g/dL   HCT 84.1 (L) 32.4 - 40.1 %   MCV 95.4 80.0 - 100.0 fL   MCH 30.5 26.0 - 34.0 pg   MCHC 32.0 30.0 - 36.0 g/dL   RDW 02.7 25.3 - 66.4 %   Platelets 178 150 - 400 K/uL   nRBC 0.0 0.0 - 0.2 %   Neutrophils Relative % 61 %   Neutro Abs 4.0 1.7 - 7.7 K/uL   Lymphocytes Relative 22 %   Lymphs Abs 1.4 0.7 - 4.0 K/uL   Monocytes Relative 14 %   Monocytes Absolute 0.9 0.1 - 1.0 K/uL   Eosinophils Relative 2 %   Eosinophils Absolute 0.1 0.0 - 0.5 K/uL   Basophils Relative 0 %   Basophils Absolute 0.0 0.0 - 0.1 K/uL   Immature Granulocytes 1 %   Abs Immature Granulocytes 0.04 0.00 - 0.07 K/uL    Comment: Performed at [facility], [address] [place], [place] [address]  Comprehensive metabolic panel     Status: Abnormal   Collection Time: 01/12/23  1:03 AM  Result Value Ref Range   Sodium 123 (L) 135 - 145 mmol/L   Potassium 4.8 3.5 - 5.1 mmol/L   Chloride 88 (L) 98 - 111 mmol/L   CO2 28 22 - 32 mmol/L   Glucose, Bld 94 70 - 99 mg/dL    Comment: Glucose reference range applies only to samples taken after fasting for at least 8 hours.   BUN 40 (H) 8 - 23 mg/dL  Creatinine, Ser 1.32 (H) 0.44 - 1.00 mg/dL   Calcium 8.0 (L) 8.9 - 10.3 mg/dL   Total Protein 5.5 (L) 6.5 - 8.1 g/dL   Albumin 2.7 (L) 3.5 - 5.0 g/dL   AST 13 (L) 15 - 41 U/L   ALT 18 0 - 44 U/L   Alkaline Phosphatase 65 38 - 126 U/L   Total Bilirubin 0.6 0.3 - 1.2 mg/dL   GFR, Estimated 39 (L) >60 mL/min    Comment: (NOTE) Calculated using the CKD-EPI Creatinine Equation (2021)    Anion gap 7 5 -  15    Comment: Performed at [facility], [address] [place], [place] [address]  Magnesium     Status: Abnormal   Collection Time: 01/12/23  1:03 AM  Result Value Ref Range   Magnesium 2.8 (H) 1.7 - 2.4 mg/dL    Comment: Performed at [facility], [address], [place], [place] [address]  Type and screen     Status: None   Collection Time: 01/12/23  1:03 AM  Result Value Ref Range   ABO/RH(D) O POS    Antibody Screen NEG    Sample Expiration      01/15/2023,2359 Performed at [facility], [address], [place], [place] [address]   Osmolality, urine     Status: Abnormal   Collection Time: 01/12/23  7:03 AM  Result Value Ref Range   Osmolality, Ur 267 (L) 300 - 900 mOsm/kg    Comment: REPEATED TO VERIFY Performed at [facility], [address] [place], [place] [address]   Sodium, urine, random     Status: None   Collection Time: 01/12/23  7:03 AM  Result Value Ref Range   Sodium, Ur 22 mmol/L    Comment: Performed at [facility], [address] [place], [place] [address]   *Note: Due to a large number of results and/or encounters for the requested time period, some results have not been displayed. A complete set of results can be found in Results Review.    DG Lumbar Spine 2-3 Views  Result Date: 01/11/2023 CLINICAL DATA:  Back pain. EXAM: LUMBAR SPINE - 2-3 VIEW COMPARISON:  Lumbar spine radiographs 12/09/2017, MRI 01/11/2023. FINDINGS: The lowest disc space is designated L5-S1, in keeping with the same-day MRI. Compression deformities of the L1 and L2 vertebral bodies are unchanged, worse at L1. The compression deformity of the L5 vertebral body seen on the same-day MRI is not well seen on the current study. Vertebral augmentation changes are noted at L3 and L4. Mild grade 1 retrolisthesis of L1 on L2 and L2 on L3 is again noted. Alignment is otherwise normal. There is disc space narrowing  most advanced at L5-S1 with vacuum disc phenomenon. There is mild disc height loss at the other levels. There is multilevel degenerative endplate change. The SI joints are intact.  The soft tissues are unremarkable. IMPRESSION: 1. Unchanged compression deformity of the L1 and L2 vertebral bodies. The compression deformity of the L5 vertebral body seen on the same-day MRI is not well seen on the current study. 2. Vertebral augmentation changes at L3 and L4. Electronically Signed   By: Lesia Hausen M.D.   On: 01/11/2023 13:21   MR LUMBAR SPINE WO CONTRAST  Result Date: 01/11/2023 CLINICAL DATA:  Fall.  New L5 compression fracture. EXAM: MRI LUMBAR SPINE WITHOUT CONTRAST TECHNIQUE: Multiplanar, multisequence MR imaging of the lumbar spine was performed. No intravenous  contrast was administered. COMPARISON:  MRI lumbar spine dated November 18, 2022. FINDINGS: Segmentation:  Standard. Alignment:  Unchanged mild retrolisthesis at L1-L2. Vertebrae: New acute L5 superior endplate compression fracture with 20% height loss. No retropulsion. Status post L4 cement augmentation with persistent diffuse marrow edema, but no progressive height loss. Nearly healed L3 compression fracture status post cement augmentation with minimal residual marrow edema. Chronic L1 and L2 compression deformities are unchanged. No evidence of discitis or suspicious bone lesion. Conus medullaris and cauda equina: Conus extends to the L1 level. Conus and cauda equina appear normal. Paraspinal and other soft tissues: Negative. Disc levels: T12-L1: Unchanged minimal disc bulging and associated endplate spurring. No stenosis. L1-L2: Unchanged mild disc bulging and endplate spurring asymmetric to the right. Unchanged mild right lateral recess stenosis. No spinal canal or neuroforaminal stenosis. L2-L3: Unchanged disc bulging and endplate spurring asymmetric to the left. No stenosis. L3-L4: Unchanged mild disc bulging, endplate spurring, and bilateral facet  arthropathy. Unchanged mild bilateral lateral recess stenosis. No spinal canal or neuroforaminal stenosis. L4-L5: Unchanged mild far lateral disc bulging. Similar moderate bilateral facet arthropathy with slight interval increase in size of the intraspinal right-sided synovial cyst. Worsened moderate spinal canal stenosis. Worsened moderate right and mild left neuroforaminal stenosis. L5-S1: Unchanged small circumferential disc osteophyte complex and mild bilateral facet arthropathy. No stenosis. IMPRESSION: 1. New acute L5 superior endplate compression fracture with 20% height loss and no retropulsion. 2. L4 compression fracture status post interval cement augmentation with persistent diffuse marrow edema, but no progressive height loss. 3. Nearly healed L3 compression fracture status post cement augmentation with minimal residual marrow edema. 4. Unchanged chronic L1 and L2 compression deformities. 5. Multilevel lumbar spondylosis as described above, progressive at L4-L5 where there is worsened moderate spinal canal and right neuroforaminal stenosis. Electronically Signed   By: Obie Dredge M.D.   On: 01/11/2023 10:08    ROS negative except above Blood pressure (!) 140/53, pulse (!) 50, temperature 97.8 F (36.6 C), temperature source Oral, resp. rate 17, height 5\' 5"  (1.651 m), weight 75.2 kg, SpO2 100 %. Physical Exam vital signs stable afebrile no acute distress abdomen is soft nontender BUN and creatinine fairly stable hemoglobin slight drop but iron deficient however with anemia of chronic disease no recent guaiac  Assessment/Plan: Multiple medical problems including anemia and multiple GI complaints and patient on Eliquis Plan: Will allow to eat today since sodium is 123 and anesthesia would not approve a endoscopy and at some point for upper tract symptoms will need either an endoscopy or a barium swallow and she is overdue for colonoscopy as well however based on her age and multiple medical  problems would hold off for now and will hold her iron and see if that helps her constipation and then if we get a guaiac in a day or 2 while off Eliquis and iron it might be more specific and I will increase her MiraLAX as well and I will check on tomorrow and decide the timing of some of her procedures as above and of course lowering narcotic dose will help her constipation as well  Courteney Alderete E 01/12/2023, 1:33 PM

## 2023-01-12 NOTE — Progress Notes (Addendum)
Physical Therapy Treatment Patient Details Name: [name] MRN: [id number] DOB: 02/23/1935 Today's Date: 01/12/2023   History of Present Illness Pt admitted from home s/p fall with L-5 compression fx and R fibular head fx (both nonoperative).  Pt with hx of HOH, CHF, colon CA, breast CA, CAD, COPD, CVA, afib, recent L3 and L4 compression fx with balloon kyphoplasty    PT Comments    Pt is progressign somehwat slowly, limited by pain RLE and  back; pt is willing to work with therapy within limits of pain, tolerated amb ~ 10' and LE exercises; pt and son with multiple complaints regarding staff not applying braces correctly,  pt not having a bath here in hospital etc. as well as complaints about rehab facility/pt's activity at such facility;  Continue PT POC  Recommendations for follow up therapy are one component of a multi-disciplinary discharge planning process, led by the attending physician.  Recommendations Schamp be updated based on patient status, additional functional criteria and insurance authorization.  Follow Up Recommendations  Can patient physically be transported by private vehicle: No    Assistance Recommended at Discharge Intermittent Supervision/Assistance  Patient can return home with the following Assistance with cooking/housework;Assist for transportation;Help with stairs or ramp for entrance;A little help with walking and/or transfers;A little help with bathing/dressing/bathroom   Equipment Recommendations  None recommended by PT    Recommendations for Other Services       Precautions / Restrictions Precautions Precautions: Fall Required Braces or Orthoses: Knee Immobilizer - Right;Spinal Brace Knee Immobilizer - Right: On when out of bed or walking Spinal Brace: Other (comment) (LSO) Spinal Brace Comments: reapplied LSO correctly, reapplied KI correctly Restrictions Weight Bearing Restrictions: No Other Position/Activity Restrictions: WBAT     Mobility  Bed  Mobility               General bed mobility comments: pt received in recliner and returned to same    Transfers Overall transfer level: Needs assistance Equipment used: Rolling walker (2 wheels) Transfers: Sit to/from Stand Sit to Stand: Min assist           General transfer comment: STS x2 d/t pain; cues for LE management and use of UEs to self assist.  Physical assist to bring wt up and fwd and to balance in standing  with RW;    Ambulation/Gait Ambulation/Gait assistance: Min assist Gait Distance (Feet): 10 Feet Assistive device: Rolling walker (2 wheels) Gait Pattern/deviations: Decreased step length - left, Trunk flexed, Decreased step length - right, Step-to pattern       General Gait Details: cues for RW position, trunk ext as able, assist to steady and maneuver RW; distance limited by pain. pt stating "my legs are giving out"   Stairs             Wheelchair Mobility    Modified Rankin (Stroke Patients Only)       Balance           Standing balance support: Bilateral upper extremity supported Standing balance-Leahy Scale: Poor                              Cognition Arousal/Alertness: Awake/alert Behavior During Therapy: WFL for tasks assessed/performed Overall Cognitive Status: Within Functional Limits for tasks assessed  Exercises General Exercises - Lower Extremity Ankle Circles/Pumps: AROM, Both, 10 reps Quad Sets: AROM, Both, 10 reps Heel Slides: AROM, Left, 10 reps    General Comments        Pertinent Vitals/Pain Pain Assessment Pain Assessment: Faces Faces Pain Scale: Hurts even more Pain Location: back and R LE-hip to ankle Pain Descriptors / Indicators: Discomfort, Grimacing, Sore Pain Intervention(s): Limited activity within patient's tolerance, Monitored during session, Repositioned    Home Living                          Prior  Function            PT Goals (current goals can now be found in the care plan section) Acute Rehab PT Goals Patient Stated Goal: Less pain, regain IND PT Goal Formulation: With patient Time For Goal Achievement: 01/23/23 Potential to Achieve Goals: Good Progress towards PT goals: Progressing toward goals    Frequency    Min 1X/week      PT Plan Current plan remains appropriate    Co-evaluation              AM-PAC PT "6 Clicks" Mobility   Outcome Measure  Help needed turning from your back to your side while in a flat bed without using bedrails?: A Lot Help needed moving from lying on your back to sitting on the side of a flat bed without using bedrails?: A Lot Help needed moving to and from a bed to a chair (including a wheelchair)?: A Little Help needed standing up from a chair using your arms (e.g., wheelchair or bedside chair)?: A Little Help needed to walk in hospital room?: A Little Help needed climbing 3-5 steps with a railing? : A Lot 6 Click Score: 15    End of Session Equipment Utilized During Treatment: Gait belt;Right knee immobilizer;Other (comment) (LSO) Activity Tolerance: Patient limited by fatigue;Patient limited by pain Patient left: in chair;with call bell/phone within reach;with family/visitor present (no alarm-in chair on PT arrival)   PT Visit Diagnosis: Unsteadiness on feet (R26.81);Muscle weakness (generalized) (M62.81);Difficulty in walking, not elsewhere classified (R26.2);Pain Pain - Right/Left: Right Pain - part of body: Leg (back)     Time: 1610-9604 PT Time Calculation (min) (ACUTE ONLY): 26 min  Charges:  $Gait Training: 8-22 mins $Therapeutic Exercise: 8-22 mins                     Delice Bison, PT  Acute Rehab Dept [facility]) [phone]  01/12/2023    [facility] 01/12/2023, 11:34 AM

## 2023-01-12 NOTE — TOC Progression Note (Addendum)
Transition of Care [facility]) - Progression Note    Patient Details  Name: [name] MRN: [id number] Date of Birth: [birth year]  Transition of Care [facility]) CM/SW Contact  Amada Jupiter, LCSW Phone Number: 01/12/2023, 10:46 AM  Clinical Narrative:     Pt with medical issues and SNF dc on hold for now.  Have alerted [facility].  Await medical clearance and [facility] need to resubmit ins auth.  Expected Discharge Plan: Skilled Nursing Facility Barriers to Discharge: Continued Medical Work up, English as a second language teacher, SNF Pending bed offer  Expected Discharge Plan and Services In-house Referral: Clinical Social Work     Living arrangements for the past 2 months: Single Family Home                                       Social Determinants of Health (SDOH) Interventions SDOH Screenings   Food Insecurity: No Food Insecurity (01/09/2023)  Housing: Low Risk  (01/09/2023)  Transportation Needs: No Transportation Needs (01/09/2023)  Utilities: Not At Risk (01/09/2023)  Alcohol Screen: Low Risk  (11/08/2022)  Depression (PHQ2-9): Low Risk  (12/16/2022)  Financial Resource Strain: Low Risk  (11/08/2022)  Recent Concern: Financial Resource Strain - Medium Risk (11/04/2022)  Physical Activity: Sufficiently Active (11/08/2022)  Recent Concern: Physical Activity - Insufficiently Active (08/11/2022)  Social Connections: Moderately Integrated (11/08/2022)  Recent Concern: Social Connections - Moderately Isolated (08/11/2022)  Stress: No Stress Concern Present (11/08/2022)  Tobacco Use: Low Risk  (01/10/2023)    Readmission Risk Interventions     No data to display

## 2023-01-12 NOTE — Progress Notes (Signed)
PROGRESS NOTE    [name]  ZOX:[id number] DOB: 03/18/1935 DOA: 01/08/2023 PCP: Donita Brooks, MD    Brief Narrative:   [name] is a 88 y.o. female with past medical history significant for coronary artery disease (cath 12/2020 with nonobstructive CAD), L3 and L4 kyphoplasty (11/2022), mild/moderate aortic stenosis (AVA 1.42cm2 via Echo 03/2022), chronic hyponatremia, permanent atrial fibrillation on Eliquis, diastolic congestive heart failure with Takotsubo cardiomyopathy in the past, hypertension, anemia of chronic disease presenting to [facility] emergency department on 5/4 with complaints of fall after tripping and low back pain.   Upon evaluation in the emergency department trauma evaluation included a CTA scan of the right femur revealing an acute nondisplaced fracture across the fibular head and neck junction along with CT imaging of the pelvis identifying an L5 superior endplate central compression fracture with slight posterior superior retropulsion and fragmentation. EDP discussed case with Dr. Shon Baton with orthopedic surgery who agreed to consult.  The hospitalist group was then called to assess the patient for admission to the hospital.   Dr. Shon Baton recommended TLSO bracing and conservative measures for management of the compression fracture.  They additionally felt that the right fibular head fracture likely also did not require surgical intervention.     Hospital course was complicated by development of hyperkalemia on hospital day 3 requiring gentle hydration, multiple doses of Lokelma as well as dextrose and insulin.  Hospital course was also complicated by development of progressive anemia, prompting intravenous Protonix and GI consultation.  Assessment & Plan:   Closed compression fracture of L5 lumbar vertebra, initial encounter  Patient presenting to the ED with acute low back pain following mechanical fall.  MR L-spine without contrast with no acute L5 superior  endplate compression fracture with 20% height loss without retropulsion, L4 compression fracture noted s/p interval cement augmentation with persistent diffuse marrow edema but no progressive height loss, nearly healed L3 compression fracture and unchanged chronic L1/L2 compression deformities; noted worsened moderate spinal canal stenosis.  Orthopedics, Dr. Shon Baton was consulted and recommended TLSO bracing and conservative measures for pain management. -- WBAT, w/ TLSO brain -- Tylenol 650 g p.o. twice daily; q6h PRN mild pain -- Robaxin 5 mg p.o. every 6 hours as needed muscle spasms -- Continue vitamin D supplementation -- Continue therapy efforts while inpatient, pending SNF placement -- Outpatient f/u Dr. Shelle Iron in 2 weeks with repeat Xrays  Closed right fibular fracture Evaluated by orthopedics, Dr. Shon Baton.  Recommend conservative management as this is not weightbearing. -- WBAT with knee immobilizer   Vitamin D deficiency Low vitamin D of 26.24 on this hospitalization despite outpatient supplementation -- started ergocalciferol 50,000 units q. weekly.   Acute blood loss anemia concern for UGIB Patient reports ongoing dark stools, although on oral iron therapy.  Elevated BUN.  Complicated by use of anticoagulation with Eliquis for underlying atrial fibrillation. -- [facility] consulted -- Hgb 9.4>>8.1>8.8>7.9 -- FOBT pending -- holding home Eliquis -- Protonix 40mg  IV q12h -- NPO, await GI eval -- Hgb q12h, CBC in am  Hyperkalemia: Resolved Patient with recurrent hyperkalemia of unknown etiology. Developed hyperkalemia several weeks ago and at that time cardiology had discontinued her spironolactone. Hyperkalemia in the setting of rising BUN, dropping hemoglobin and hematocrit and reports of black stool being about concern for upper gastrointestinal bleeding.  Received Lokelma, D50, insulin and gentle intravenous fluids with now resolution. -- K 4.3>>5.5>5.7>4.8 -- Repeat BMP in  a.m.   Acute on chronic hyponatremia  Reportedly "chronic" hyponatremia however baseline sodium after chart review reveals baseline sodium in the low 130s. Sodium is currently lower than baseline, with concurrent hypochloremia suggesting volume depletion; complicated by home use of torsemide 40 mg p.o. twice daily.  Urine sodium 22, urine osmolality 267. -- Na 131>132>129>>127>126>123 -- Holding home torsemide -- NS at 75 mL/h   Left ankle pain Ongoing severe left ankle pain that preceded the patient's fall seems to have been increasing as of late Plain film imaging of the ankle performed on admission revealed no evidence of fracture CT imaging additionally reveals no evidence of fracture.  Uric acid level elevated at 12.0.  No physical exam evidence of gout but considering negative imaging and elevated uric acid level received one-time dose of colchicine. -- Will hold off further colchicine dosing due to concern for upper GI bleed as above; if pain to left ankle persists, will consider prednisone initiation  CKD stage IIIb Baseline creatinine 1.2-1.3 last year. -- Cr 1.12>>1.54>1.33>1.34>1.32 -- BMP daily   Persistent atrial fibrillation  -- Holding home Eliquis due to concern of worsening anemia, upper GI bleed   Chronic diastolic CHF (congestive heart failure); compensated No clinical evidence of cardiogenic volume overload -- Strict input and output monitoring -- Daily weights -- Low-sodium diet    Essential hypertension --Clonidine 0.2 mg p.o. twice daily  Anxiety -- Clonazepam 0.5 mg p.o. twice daily as needed      DVT prophylaxis: Place and maintain sequential compression device Start: 01/11/23 1334 SCDs Start: 01/08/23 2355    Code Status: Full Code Family Communication:   Disposition Plan:  Level of care: Med-Surg Status is: Observation The patient remains OBS appropriate and will d/c before 2 midnights.    Consultants:  [facility]  Procedures:   None  Antimicrobials:  None   Subjective: Patient seen examined bedside, resting comfortably.  Sitting in bedside chair.  Continues with low back pain.  Hemoglobin continues to trend down, 7.9 this morning.  Reports a dark stool yesterday, states it is chronically that color.  No further bowel movement as of yet today.  No other specific questions or concerns at this time.  Denies headache, no dizziness, no chest pain, no palpitations, no shortness of breath, no abdominal pain, no fever/chills/night sweats, no nausea/vomiting/diarrhea.  No acute events overnight per nursing staff.  Objective: Vitals:   01/11/23 1435 01/11/23 1928 01/12/23 0500 01/12/23 0610  BP: (!) 168/44   (!) 140/53  Pulse: (!) 58   (!) 50  Resp: 18   17  Temp: 98 F (36.7 C)   97.8 F (36.6 C)  TempSrc: Oral   Oral  SpO2: 95% 95%  100%  Weight:   75.2 kg   Height:        Intake/Output Summary (Last 24 hours) at 01/12/2023 1323 Last data filed at 01/12/2023 5784 Gross per 24 hour  Intake 978 ml  Output 600 ml  Net 378 ml   Filed Weights   01/08/23 1700 01/09/23 0254 01/12/23 0500  Weight: 75.8 kg 73.2 kg 75.2 kg    Examination:  Physical Exam: GEN: NAD, alert and oriented x 3, chronically ill/elderly in appearance, knee immobilizer and TLSO brace noted in place HEENT: NCAT, PERRL, EOMI, sclera clear, MMM PULM: CTAB w/o wheezes/crackles, normal respiratory effort, on room air CV: RRR w/o M/G/R GI: abd soft, NTND, NABS, no R/G/M MSK: no peripheral edema, moves all extremities independently, knee immobilizer noted in place, slightly reluctant to ROM due to pain NEURO:  No focal neurological deficits noted PSYCH: normal mood/affect Integumentary: dry/intact, no rashes or wounds    Data Reviewed: I have personally reviewed following labs and imaging studies  CBC: Recent Labs  Lab 01/09/23 0325 01/10/23 0308 01/11/23 0316 01/11/23 1738 01/12/23 0103  WBC 6.9 7.6 6.5 7.7 6.5  NEUTROABS 4.6 5.2  3.8  --  4.0  HGB 8.7* 8.4* 8.1* 8.8* 7.9*  HCT 27.1* 26.4* 25.5* 27.9* 24.7*  MCV 96.4 97.8 95.9 97.2 95.4  PLT 206 187 178 203 178   Basic Metabolic Panel: Recent Labs  Lab 01/09/23 0325 01/09/23 0326 01/10/23 0308 01/11/23 0316 01/11/23 1738 01/12/23 0103  NA 132*  --  129* 127* 126* 123*  K 4.3  --  4.5 5.5* 5.7* 4.8  CL 94*  --  89* 90* 89* 88*  CO2 31  --  33* 31 28 28   GLUCOSE 122*  --  136* 118* 133* 94  BUN 36*  --  38* 45* 41* 40*  CREATININE 1.13*  --  1.54* 1.33* 1.34* 1.32*  CALCIUM 8.7*  --  8.3* 8.2* 8.4* 8.0*  MG 2.8* 2.8* 3.2* 3.0*  --  2.8*   GFR: Estimated Creatinine Clearance: 31.1 mL/min (A) (by C-G formula based on SCr of 1.32 mg/dL (H)). Liver Function Tests: Recent Labs  Lab 01/09/23 0325 01/10/23 0308 01/11/23 0316 01/12/23 0103  AST 22 20 16  13*  ALT 26 26 21 18   ALKPHOS 68 72 72 65  BILITOT 0.7 0.7 0.6 0.6  PROT 5.9* 5.9* 5.5* 5.5*  ALBUMIN 3.0* 2.9* 2.8* 2.7*   No results for input(s): "LIPASE", "AMYLASE" in the last 168 hours. No results for input(s): "AMMONIA" in the last 168 hours. Coagulation Profile: No results for input(s): "INR", "PROTIME" in the last 168 hours. Cardiac Enzymes: No results for input(s): "CKTOTAL", "CKMB", "CKMBINDEX", "TROPONINI" in the last 168 hours. BNP (last 3 results) No results for input(s): "PROBNP" in the last 8760 hours. HbA1C: Recent Labs    01/11/23 0316  HGBA1C 5.6   CBG: No results for input(s): "GLUCAP" in the last 168 hours. Lipid Profile: No results for input(s): "CHOL", "HDL", "LDLCALC", "TRIG", "CHOLHDL", "LDLDIRECT" in the last 72 hours. Thyroid Function Tests: No results for input(s): "TSH", "T4TOTAL", "FREET4", "T3FREE", "THYROIDAB" in the last 72 hours. Anemia Panel: Recent Labs    01/11/23 0316  FERRITIN 119  TIBC 244*  IRON 27*   Sepsis Labs: No results for input(s): "PROCALCITON", "LATICACIDVEN" in the last 168 hours.  No results found for this or any previous visit  (from the past 240 hour(s)).       Radiology Studies: DG Lumbar Spine 2-3 Views  Result Date: 01/11/2023 CLINICAL DATA:  Back pain. EXAM: LUMBAR SPINE - 2-3 VIEW COMPARISON:  Lumbar spine radiographs 12/09/2017, MRI 01/11/2023. FINDINGS: The lowest disc space is designated L5-S1, in keeping with the same-day MRI. Compression deformities of the L1 and L2 vertebral bodies are unchanged, worse at L1. The compression deformity of the L5 vertebral body seen on the same-day MRI is not well seen on the current study. Vertebral augmentation changes are noted at L3 and L4. Mild grade 1 retrolisthesis of L1 on L2 and L2 on L3 is again noted. Alignment is otherwise normal. There is disc space narrowing most advanced at L5-S1 with vacuum disc phenomenon. There is mild disc height loss at the other levels. There is multilevel degenerative endplate change. The SI joints are intact.  The soft tissues are unremarkable. IMPRESSION: 1. Unchanged compression deformity  of the L1 and L2 vertebral bodies. The compression deformity of the L5 vertebral body seen on the same-day MRI is not well seen on the current study. 2. Vertebral augmentation changes at L3 and L4. Electronically Signed   By: Lesia Hausen M.D.   On: 01/11/2023 13:21   MR LUMBAR SPINE WO CONTRAST  Result Date: 01/11/2023 CLINICAL DATA:  Fall.  New L5 compression fracture. EXAM: MRI LUMBAR SPINE WITHOUT CONTRAST TECHNIQUE: Multiplanar, multisequence MR imaging of the lumbar spine was performed. No intravenous contrast was administered. COMPARISON:  MRI lumbar spine dated November 18, 2022. FINDINGS: Segmentation:  Standard. Alignment:  Unchanged mild retrolisthesis at L1-L2. Vertebrae: New acute L5 superior endplate compression fracture with 20% height loss. No retropulsion. Status post L4 cement augmentation with persistent diffuse marrow edema, but no progressive height loss. Nearly healed L3 compression fracture status post cement augmentation with minimal  residual marrow edema. Chronic L1 and L2 compression deformities are unchanged. No evidence of discitis or suspicious bone lesion. Conus medullaris and cauda equina: Conus extends to the L1 level. Conus and cauda equina appear normal. Paraspinal and other soft tissues: Negative. Disc levels: T12-L1: Unchanged minimal disc bulging and associated endplate spurring. No stenosis. L1-L2: Unchanged mild disc bulging and endplate spurring asymmetric to the right. Unchanged mild right lateral recess stenosis. No spinal canal or neuroforaminal stenosis. L2-L3: Unchanged disc bulging and endplate spurring asymmetric to the left. No stenosis. L3-L4: Unchanged mild disc bulging, endplate spurring, and bilateral facet arthropathy. Unchanged mild bilateral lateral recess stenosis. No spinal canal or neuroforaminal stenosis. L4-L5: Unchanged mild far lateral disc bulging. Similar moderate bilateral facet arthropathy with slight interval increase in size of the intraspinal right-sided synovial cyst. Worsened moderate spinal canal stenosis. Worsened moderate right and mild left neuroforaminal stenosis. L5-S1: Unchanged small circumferential disc osteophyte complex and mild bilateral facet arthropathy. No stenosis. IMPRESSION: 1. New acute L5 superior endplate compression fracture with 20% height loss and no retropulsion. 2. L4 compression fracture status post interval cement augmentation with persistent diffuse marrow edema, but no progressive height loss. 3. Nearly healed L3 compression fracture status post cement augmentation with minimal residual marrow edema. 4. Unchanged chronic L1 and L2 compression deformities. 5. Multilevel lumbar spondylosis as described above, progressive at L4-L5 where there is worsened moderate spinal canal and right neuroforaminal stenosis. Electronically Signed   By: Obie Dredge M.D.   On: 01/11/2023 10:08        Scheduled Meds:  acetaminophen  650 mg Oral BID   calcitonin (salmon)  1 spray  Alternating Nares Daily   cloNIDine  0.2 mg Oral BID   docusate sodium  100 mg Oral BID   mometasone-formoterol  2 puff Inhalation BID   pantoprazole (PROTONIX) IV  40 mg Intravenous Q12H   polyethylene glycol  17 g Oral BID   Vitamin D (Ergocalciferol)  50,000 Units Oral Q7 days   Continuous Infusions:  sodium chloride 75 mL/hr at 01/12/23 0955     LOS: 0 days    Time spent: 51 minutes spent on chart review, discussion with nursing staff, consultants, updating family and interview/physical exam; more than 50% of that time was spent in counseling and/or coordination of care.    Alvira Philips [place], DO [facility] Available via Epic secure chat 7am-7pm After these hours, please refer to coverage provider listed on [email or web address] 01/12/2023, 1:23 PM

## 2023-01-12 NOTE — Plan of Care (Signed)
?  Problem: Activity: ?Goal: Risk for activity intolerance will decrease ?Outcome: Progressing ?  ?Problem: Safety: ?Goal: Ability to remain free from injury will improve ?Outcome: Progressing ?  ?Problem: Pain Managment: ?Goal: General experience of comfort will improve ?Outcome: Progressing ?  ?

## 2023-01-13 DIAGNOSIS — K31819 Angiodysplasia of stomach and duodenum without bleeding: Secondary | ICD-10-CM | POA: Diagnosis not present

## 2023-01-13 DIAGNOSIS — S82831A Other fracture of upper and lower end of right fibula, initial encounter for closed fracture: Secondary | ICD-10-CM | POA: Diagnosis present

## 2023-01-13 DIAGNOSIS — S32000A Wedge compression fracture of unspecified lumbar vertebra, initial encounter for closed fracture: Secondary | ICD-10-CM | POA: Diagnosis present

## 2023-01-13 DIAGNOSIS — I130 Hypertensive heart and chronic kidney disease with heart failure and stage 1 through stage 4 chronic kidney disease, or unspecified chronic kidney disease: Secondary | ICD-10-CM | POA: Diagnosis present

## 2023-01-13 DIAGNOSIS — Z7901 Long term (current) use of anticoagulants: Secondary | ICD-10-CM | POA: Diagnosis not present

## 2023-01-13 DIAGNOSIS — D62 Acute posthemorrhagic anemia: Secondary | ICD-10-CM | POA: Diagnosis not present

## 2023-01-13 DIAGNOSIS — I2510 Atherosclerotic heart disease of native coronary artery without angina pectoris: Secondary | ICD-10-CM | POA: Diagnosis present

## 2023-01-13 DIAGNOSIS — T502X5A Adverse effect of carbonic-anhydrase inhibitors, benzothiadiazides and other diuretics, initial encounter: Secondary | ICD-10-CM | POA: Diagnosis present

## 2023-01-13 DIAGNOSIS — W010XXA Fall on same level from slipping, tripping and stumbling without subsequent striking against object, initial encounter: Secondary | ICD-10-CM | POA: Diagnosis present

## 2023-01-13 DIAGNOSIS — D509 Iron deficiency anemia, unspecified: Secondary | ICD-10-CM | POA: Diagnosis present

## 2023-01-13 DIAGNOSIS — K2971 Gastritis, unspecified, with bleeding: Secondary | ICD-10-CM | POA: Diagnosis not present

## 2023-01-13 DIAGNOSIS — K2970 Gastritis, unspecified, without bleeding: Secondary | ICD-10-CM | POA: Diagnosis not present

## 2023-01-13 DIAGNOSIS — I252 Old myocardial infarction: Secondary | ICD-10-CM | POA: Diagnosis not present

## 2023-01-13 DIAGNOSIS — K317 Polyp of stomach and duodenum: Secondary | ICD-10-CM | POA: Diagnosis not present

## 2023-01-13 DIAGNOSIS — I5032 Chronic diastolic (congestive) heart failure: Secondary | ICD-10-CM | POA: Diagnosis present

## 2023-01-13 DIAGNOSIS — K2289 Other specified disease of esophagus: Secondary | ICD-10-CM | POA: Diagnosis not present

## 2023-01-13 DIAGNOSIS — I080 Rheumatic disorders of both mitral and aortic valves: Secondary | ICD-10-CM | POA: Diagnosis present

## 2023-01-13 DIAGNOSIS — I272 Pulmonary hypertension, unspecified: Secondary | ICD-10-CM | POA: Diagnosis present

## 2023-01-13 DIAGNOSIS — K224 Dyskinesia of esophagus: Secondary | ICD-10-CM | POA: Diagnosis not present

## 2023-01-13 DIAGNOSIS — E785 Hyperlipidemia, unspecified: Secondary | ICD-10-CM | POA: Diagnosis present

## 2023-01-13 DIAGNOSIS — J449 Chronic obstructive pulmonary disease, unspecified: Secondary | ICD-10-CM | POA: Diagnosis present

## 2023-01-13 DIAGNOSIS — F411 Generalized anxiety disorder: Secondary | ICD-10-CM | POA: Diagnosis present

## 2023-01-13 DIAGNOSIS — N1832 Chronic kidney disease, stage 3b: Secondary | ICD-10-CM | POA: Diagnosis present

## 2023-01-13 DIAGNOSIS — E878 Other disorders of electrolyte and fluid balance, not elsewhere classified: Secondary | ICD-10-CM | POA: Diagnosis present

## 2023-01-13 DIAGNOSIS — S82451A Displaced comminuted fracture of shaft of right fibula, initial encounter for closed fracture: Secondary | ICD-10-CM | POA: Diagnosis present

## 2023-01-13 DIAGNOSIS — D631 Anemia in chronic kidney disease: Secondary | ICD-10-CM | POA: Diagnosis present

## 2023-01-13 DIAGNOSIS — K31811 Angiodysplasia of stomach and duodenum with bleeding: Secondary | ICD-10-CM | POA: Diagnosis not present

## 2023-01-13 DIAGNOSIS — Y92009 Unspecified place in unspecified non-institutional (private) residence as the place of occurrence of the external cause: Secondary | ICD-10-CM | POA: Diagnosis not present

## 2023-01-13 DIAGNOSIS — E871 Hypo-osmolality and hyponatremia: Secondary | ICD-10-CM | POA: Diagnosis present

## 2023-01-13 DIAGNOSIS — E875 Hyperkalemia: Secondary | ICD-10-CM | POA: Diagnosis not present

## 2023-01-13 DIAGNOSIS — S32050A Wedge compression fracture of fifth lumbar vertebra, initial encounter for closed fracture: Secondary | ICD-10-CM | POA: Diagnosis not present

## 2023-01-13 DIAGNOSIS — K5520 Angiodysplasia of colon without hemorrhage: Secondary | ICD-10-CM | POA: Diagnosis not present

## 2023-01-13 DIAGNOSIS — I4821 Permanent atrial fibrillation: Secondary | ICD-10-CM | POA: Diagnosis present

## 2023-01-13 DIAGNOSIS — S32059A Unspecified fracture of fifth lumbar vertebra, initial encounter for closed fracture: Secondary | ICD-10-CM | POA: Diagnosis present

## 2023-01-13 DIAGNOSIS — M4856XA Collapsed vertebra, not elsewhere classified, lumbar region, initial encounter for fracture: Secondary | ICD-10-CM | POA: Diagnosis present

## 2023-01-13 LAB — CBC
HCT: 24 % — ABNORMAL LOW (ref 36.0–46.0)
Hemoglobin: 7.5 g/dL — ABNORMAL LOW (ref 12.0–15.0)
MCH: 30.6 pg (ref 26.0–34.0)
MCHC: 31.3 g/dL (ref 30.0–36.0)
MCV: 98 fL (ref 80.0–100.0)
Platelets: 183 10*3/uL (ref 150–400)
RBC: 2.45 MIL/uL — ABNORMAL LOW (ref 3.87–5.11)
RDW: 14.3 % (ref 11.5–15.5)
WBC: 4.9 10*3/uL (ref 4.0–10.5)
nRBC: 0 % (ref 0.0–0.2)

## 2023-01-13 LAB — BASIC METABOLIC PANEL
Anion gap: 6 (ref 5–15)
BUN: 34 mg/dL — ABNORMAL HIGH (ref 8–23)
CO2: 26 mmol/L (ref 22–32)
Calcium: 7.9 mg/dL — ABNORMAL LOW (ref 8.9–10.3)
Chloride: 93 mmol/L — ABNORMAL LOW (ref 98–111)
Creatinine, Ser: 1.36 mg/dL — ABNORMAL HIGH (ref 0.44–1.00)
GFR, Estimated: 38 mL/min — ABNORMAL LOW (ref >60)
Glucose, Bld: 104 mg/dL — ABNORMAL HIGH (ref 70–99)
Potassium: 5 mmol/L (ref 3.5–5.1)
Sodium: 125 mmol/L — ABNORMAL LOW (ref 135–145)

## 2023-01-13 LAB — SODIUM: Sodium: 127 mmol/L — ABNORMAL LOW (ref 135–145)

## 2023-01-13 LAB — HEMOGLOBIN AND HEMATOCRIT, BLOOD
HCT: 28 % — ABNORMAL LOW (ref 36.0–46.0)
Hemoglobin: 8.9 g/dL — ABNORMAL LOW (ref 12.0–15.0)

## 2023-01-13 MED ORDER — OXYCODONE-ACETAMINOPHEN 5-325 MG PO TABS
1.0000 | ORAL_TABLET | ORAL | Status: DC | PRN
  Administered 2023-01-13 – 2023-01-17 (×14): 1 via ORAL
  Filled 2023-01-13 (×14): qty 1

## 2023-01-13 MED ORDER — MORPHINE SULFATE (PF) 2 MG/ML IV SOLN
2.0000 mg | INTRAVENOUS | Status: DC | PRN
  Administered 2023-01-14 – 2023-01-16 (×6): 2 mg via INTRAVENOUS
  Filled 2023-01-13 (×6): qty 1

## 2023-01-13 MED ORDER — SODIUM CHLORIDE 1 G PO TABS
1.0000 g | ORAL_TABLET | Freq: Two times a day (BID) | ORAL | Status: DC
  Administered 2023-01-13 (×2): 1 g via ORAL
  Filled 2023-01-13 (×3): qty 1

## 2023-01-13 MED ORDER — HYDRALAZINE HCL 25 MG PO TABS: 25.0000 mg | ORAL_TABLET | Freq: Four times a day (QID) | ORAL | Status: DC | PRN

## 2023-01-13 NOTE — Progress Notes (Signed)
PROGRESS NOTE    [name]  ZOX:[id number] DOB: [birth year] DOA: 01/08/2023 PCP: Donita Brooks, MD    Brief Narrative:   [name] is a 88 y.o. female with past medical history significant for coronary artery disease (cath 12/2020 with nonobstructive CAD), L3 and L4 kyphoplasty (11/2022), mild/moderate aortic stenosis (AVA 1.42cm2 via Echo 03/2022), chronic hyponatremia, permanent atrial fibrillation on Eliquis, diastolic congestive heart failure with Takotsubo cardiomyopathy in the past, hypertension, anemia of chronic disease presenting to [facility] emergency department on 5/4 with complaints of fall after tripping and low back pain.   Upon evaluation in the emergency department trauma evaluation included a CTA scan of the right femur revealing an acute nondisplaced fracture across the fibular head and neck junction along with CT imaging of the pelvis identifying an L5 superior endplate central compression fracture with slight posterior superior retropulsion and fragmentation. EDP discussed case with Dr. Shon Baton with orthopedic surgery who agreed to consult.  The hospitalist group was then called to assess the patient for admission to the hospital.   Dr. Shon Baton recommended TLSO bracing and conservative measures for management of the compression fracture.  They additionally felt that the right fibular head fracture likely also did not require surgical intervention.     Hospital course was complicated by development of hyperkalemia on hospital day 3 requiring gentle hydration, multiple doses of Lokelma as well as dextrose and insulin.  Hospital course was also complicated by development of progressive anemia, prompting intravenous Protonix and GI consultation.  Assessment & Plan:   Closed compression fracture of L5 lumbar vertebra, initial encounter  Patient presenting to the ED with acute low back pain following mechanical fall.  MR L-spine without contrast with no acute L5 superior  endplate compression fracture with 20% height loss without retropulsion, L4 compression fracture noted s/p interval cement augmentation with persistent diffuse marrow edema but no progressive height loss, nearly healed L3 compression fracture and unchanged chronic L1/L2 compression deformities; noted worsened moderate spinal canal stenosis.  Orthopedics, Dr. Shon Baton was consulted and recommended TLSO bracing and conservative measures for pain management. -- WBAT, w/ TLSO brain -- Tylenol 650 g p.o. twice daily; q6h PRN mild pain -- Robaxin 5 mg p.o. every 6 hours as needed muscle spasms -- Continue vitamin D supplementation -- Continue therapy efforts while inpatient, pending SNF placement -- Outpatient f/u Dr. Shelle Iron in 2 weeks with repeat Xrays  Closed right fibular fracture Evaluated by orthopedics, Dr. Shon Baton.  Recommend conservative management as this is not weightbearing. -- WBAT with knee immobilizer   Vitamin D deficiency Low vitamin D of 26.24 on this hospitalization despite outpatient supplementation -- started ergocalciferol 50,000 units q. weekly.   Acute blood loss anemia concern for UGIB Patient reports ongoing dark stools, although on oral iron therapy.  Elevated BUN.  Complicated by use of anticoagulation with Eliquis for underlying atrial fibrillation. -- [facility] following, appreciate assistance -- Hgb 9.4>>8.1>8.8>7.9>9.1>7.5 -- FOBT + -- holding home Eliquis/iron supplement -- Protonix 40mg  IV q12h -- Hgb q12h, CBC in am  Hyperkalemia: Resolved Patient with recurrent hyperkalemia of unknown etiology. Developed hyperkalemia several weeks ago and at that time cardiology had discontinued her spironolactone. Hyperkalemia in the setting of rising BUN, dropping hemoglobin and hematocrit and reports of black stool being about concern for upper gastrointestinal bleeding.  Received Lokelma, D50, insulin and gentle intravenous fluids with now resolution. -- K  4.3>>5.5>5.7>4.8>5.0 -- Repeat BMP in a.m.   Acute on chronic hyponatremia Reportedly "chronic"  hyponatremia however baseline sodium after chart review reveals baseline sodium in the low 130s. Sodium is currently lower than baseline, with concurrent hypochloremia suggesting volume depletion; complicated by home use of torsemide 40 mg p.o. twice daily.  Urine sodium 22, urine osmolality 267. -- Na 131>132>129>>127>126>123>125 -- Holding home torsemide -- NS at 100 mL/h -- Sodium chloride 1 g PO BID -- Na q12h and BMP daily   Left ankle pain Ongoing severe left ankle pain that preceded the patient's fall seems to have been increasing as of late Plain film imaging of the ankle performed on admission revealed no evidence of fracture CT imaging additionally reveals no evidence of fracture.  Uric acid level elevated at 12.0.  No physical exam evidence of gout but considering negative imaging and elevated uric acid level received one-time dose of colchicine. -- Will hold off further colchicine dosing due to concern for upper GI bleed as above; if pain to left ankle persists, will consider prednisone initiation  CKD stage IIIb Baseline creatinine 1.2-1.3 over past year. -- Cr 1.12>>1.54>1.33>1.34>1.32>1.36 -- BMP daily   Persistent atrial fibrillation  -- Holding home Eliquis due to concern of worsening anemia, upper GI bleed   Chronic diastolic CHF (congestive heart failure); compensated No clinical evidence of cardiogenic volume overload -- Strict input and output monitoring -- Daily weights -- Low-sodium diet    Essential hypertension -- Clonidine 0.2 mg p.o. twice daily -- Hydralazine 25 mg PO q6h PRN SBP >165  Anxiety -- Clonazepam 0.5 mg p.o. twice daily as needed      DVT prophylaxis: Place and maintain sequential compression device Start: 01/11/23 1334 SCDs Start: 01/08/23 2355    Code Status: Full Code Family Communication:   Disposition Plan:  Level of care:  Med-Surg Status is: Observation The patient remains OBS appropriate and will d/c before 2 midnights.    Consultants:  [facility]  Procedures:  None  Antimicrobials:  None   Subjective: Patient seen examined bedside, resting comfortably.  Lying in bed.  Continues to complain of back pain; but hopes to get up to the bedside chair today as that made her feel better yesterday.  For dark stools.  Hemoccult positive yesterday.  Seen by GI yesterday.  Sodium slightly improved this morning.   No other specific questions or concerns at this time.  Denies headache, no dizziness, no chest pain, no palpitations, no shortness of breath, no abdominal pain, no fever/chills/night sweats, no nausea/vomiting/diarrhea.  No acute events overnight per nursing staff.  Objective: Vitals:   01/12/23 1400 01/12/23 1946 01/12/23 2148 01/13/23 0753  BP: (!) 160/55  (!) 142/62 (!) 183/56  Pulse: (!) 59  (!) 55 (!) 57  Resp: 20  17 16   Temp: 98.3 F (36.8 C)  98.7 F (37.1 C)   TempSrc: Oral  Oral   SpO2: 96% 93% 93% 96%  Weight:      Height:        Intake/Output Summary (Last 24 hours) at 01/13/2023 1038 Last data filed at 01/13/2023 0710 Gross per 24 hour  Intake 2816.13 ml  Output 201 ml  Net 2615.13 ml   Filed Weights   01/08/23 1700 01/09/23 0254 01/12/23 0500  Weight: 75.8 kg 73.2 kg 75.2 kg    Examination:  Physical Exam: GEN: NAD, alert and oriented x 3, chronically ill/elderly in appearance, knee immobilizer and TLSO brace noted in place HEENT: NCAT, PERRL, EOMI, sclera clear, MMM PULM: CTAB w/o wheezes/crackles, normal respiratory effort, on room air CV: RRR  w/o M/G/R GI: abd soft, NTND, NABS, no R/G/M MSK: no peripheral edema, moves all extremities independently, knee immobilizer noted in place, slightly reluctant to ROM due to pain NEURO: No focal neurological deficits noted PSYCH: normal mood/affect Integumentary: dry/intact, no rashes or wounds    Data Reviewed: I  have personally reviewed following labs and imaging studies  CBC: Recent Labs  Lab 01/09/23 0325 01/10/23 0308 01/11/23 0316 01/11/23 1738 01/12/23 0103 01/12/23 1408 01/13/23 0333  WBC 6.9 7.6 6.5 7.7 6.5  --  4.9  NEUTROABS 4.6 5.2 3.8  --  4.0  --   --   HGB 8.7* 8.4* 8.1* 8.8* 7.9* 9.1* 7.5*  HCT 27.1* 26.4* 25.5* 27.9* 24.7* 28.8* 24.0*  MCV 96.4 97.8 95.9 97.2 95.4  --  98.0  PLT 206 187 178 203 178  --  183   Basic Metabolic Panel: Recent Labs  Lab 01/09/23 0325 01/09/23 0326 01/10/23 0308 01/11/23 0316 01/11/23 1738 01/12/23 0103 01/12/23 2113 01/13/23 0333  NA 132*  --  129* 127* 126* 123* 125* 125*  K 4.3  --  4.5 5.5* 5.7* 4.8  --  5.0  CL 94*  --  89* 90* 89* 88*  --  93*  CO2 31  --  33* 31 28 28   --  26  GLUCOSE 122*  --  136* 118* 133* 94  --  104*  BUN 36*  --  38* 45* 41* 40*  --  34*  CREATININE 1.13*  --  1.54* 1.33* 1.34* 1.32*  --  1.36*  CALCIUM 8.7*  --  8.3* 8.2* 8.4* 8.0*  --  7.9*  MG 2.8* 2.8* 3.2* 3.0*  --  2.8*  --   --    GFR: Estimated Creatinine Clearance: 30.1 mL/min (A) (by C-G formula based on SCr of 1.36 mg/dL (H)). Liver Function Tests: Recent Labs  Lab 01/09/23 0325 01/10/23 0308 01/11/23 0316 01/12/23 0103  AST 22 20 16  13*  ALT 26 26 21 18   ALKPHOS 68 72 72 65  BILITOT 0.7 0.7 0.6 0.6  PROT 5.9* 5.9* 5.5* 5.5*  ALBUMIN 3.0* 2.9* 2.8* 2.7*   No results for input(s): "LIPASE", "AMYLASE" in the last 168 hours. No results for input(s): "AMMONIA" in the last 168 hours. Coagulation Profile: No results for input(s): "INR", "PROTIME" in the last 168 hours. Cardiac Enzymes: No results for input(s): "CKTOTAL", "CKMB", "CKMBINDEX", "TROPONINI" in the last 168 hours. BNP (last 3 results) No results for input(s): "PROBNP" in the last 8760 hours. HbA1C: Recent Labs    01/11/23 0316  HGBA1C 5.6   CBG: No results for input(s): "GLUCAP" in the last 168 hours. Lipid Profile: No results for input(s): "CHOL", "HDL",  "LDLCALC", "TRIG", "CHOLHDL", "LDLDIRECT" in the last 72 hours. Thyroid Function Tests: No results for input(s): "TSH", "T4TOTAL", "FREET4", "T3FREE", "THYROIDAB" in the last 72 hours. Anemia Panel: Recent Labs    01/11/23 0316  FERRITIN 119  TIBC 244*  IRON 27*   Sepsis Labs: No results for input(s): "PROCALCITON", "LATICACIDVEN" in the last 168 hours.  No results found for this or any previous visit (from the past 240 hour(s)).       Radiology Studies: No results found.      Scheduled Meds:  acetaminophen  650 mg Oral BID   calcitonin (salmon)  1 spray Alternating Nares Daily   cloNIDine  0.2 mg Oral BID   docusate sodium  100 mg Oral BID   mometasone-formoterol  2 puff Inhalation BID  pantoprazole (PROTONIX) IV  40 mg Intravenous Q12H   polyethylene glycol  17 g Oral BID   sodium chloride  1 g Oral BID WC   Vitamin D (Ergocalciferol)  50,000 Units Oral Q7 days   Continuous Infusions:  sodium chloride 100 mL/hr at 01/12/23 2249     LOS: 0 days    Time spent: 51 minutes spent on chart review, discussion with nursing staff, consultants, updating family and interview/physical exam; more than 50% of that time was spent in counseling and/or coordination of care.    Alvira Philips [place], DO [facility] Available via Epic secure chat 7am-7pm After these hours, please refer to coverage provider listed on [email or web address] 01/13/2023, 10:38 AM

## 2023-01-13 NOTE — Progress Notes (Signed)
[name] Dau 1:41 PM  Subjective: Patient doing about the same tolerating some diet no signs of obvious bleeding lots of ecchymosis on her arms she does occasionally have right lower quadrant abdominal pain which gets better with passing gas Case discussed with her family as well Objective: Vital signs stable afebrile no acute distress abdomen is soft nontender sodium up a little BUN slight decrease hemoglobin decreased and increased periodically white count and platelets okay  Assessment: Guaiac positive anemia in patient with multiple medical complaints and problem on Eliquis  Plan: The risk benefits methods of endoscopy was discussed and we will proceed tomorrow by my partner Dr. Lorenso Quarry and I might consider a CT of the abdomen and pelvis with oral contrast only next prior to proceeding with a colonoscopy but we will wait on the endoscopic findings and decide how to proceed  [facility]  office [phone] After 5PM or if no answer call [phone]

## 2023-01-14 DIAGNOSIS — S32050A Wedge compression fracture of fifth lumbar vertebra, initial encounter for closed fracture: Secondary | ICD-10-CM | POA: Diagnosis not present

## 2023-01-14 LAB — BASIC METABOLIC PANEL
Anion gap: 7 (ref 5–15)
BUN: 30 mg/dL — ABNORMAL HIGH (ref 8–23)
CO2: 22 mmol/L (ref 22–32)
Calcium: 8.2 mg/dL — ABNORMAL LOW (ref 8.9–10.3)
Chloride: 98 mmol/L (ref 98–111)
Creatinine, Ser: 1.23 mg/dL — ABNORMAL HIGH (ref 0.44–1.00)
GFR, Estimated: 43 mL/min — ABNORMAL LOW (ref >60)
Glucose, Bld: 104 mg/dL — ABNORMAL HIGH (ref 70–99)
Potassium: 5.1 mmol/L (ref 3.5–5.1)
Sodium: 127 mmol/L — ABNORMAL LOW (ref 135–145)

## 2023-01-14 LAB — CBC
HCT: 24 % — ABNORMAL LOW (ref 36.0–46.0)
Hemoglobin: 7.5 g/dL — ABNORMAL LOW (ref 12.0–15.0)
MCH: 30.4 pg (ref 26.0–34.0)
MCHC: 31.3 g/dL (ref 30.0–36.0)
MCV: 97.2 fL (ref 80.0–100.0)
Platelets: 187 10*3/uL (ref 150–400)
RBC: 2.47 MIL/uL — ABNORMAL LOW (ref 3.87–5.11)
RDW: 14.3 % (ref 11.5–15.5)
WBC: 5 10*3/uL (ref 4.0–10.5)
nRBC: 0 % (ref 0.0–0.2)

## 2023-01-14 MED ORDER — SODIUM CHLORIDE 1 G PO TABS
2.0000 g | ORAL_TABLET | Freq: Two times a day (BID) | ORAL | Status: DC
  Administered 2023-01-14 – 2023-01-17 (×8): 2 g via ORAL
  Filled 2023-01-14 (×9): qty 2

## 2023-01-14 MED ORDER — LACTATED RINGERS IV SOLN: INTRAVENOUS | Status: DC | PRN

## 2023-01-14 NOTE — Plan of Care (Signed)
  Problem: Coping: Goal: Level of anxiety will decrease Outcome: Progressing   Problem: Pain Managment: Goal: General experience of comfort will improve Outcome: Progressing   Problem: Safety: Goal: Ability to remain free from injury will improve Outcome: Progressing   

## 2023-01-14 NOTE — Progress Notes (Signed)
PT Cancellation Note  Patient Details Name: [name] MRN: [id number] DOB: 24-Oct-1934   Cancelled Treatment:     Pt scheduled for an EGC today.  Pt has been evaluated and will continue to follow during Acute Care Stay.   Armando Reichert 01/14/2023, 8:35 AM

## 2023-01-14 NOTE — H&P (View-Only) (Signed)
[facility] Progress Note  SUBJECTIVE:   Interval history: [name] was seen and evaluated today at bedside.  Her son present at bedside as well.  She noted having some intermittent chest discomfort.  She noted having some intermittent shortness of breath.  She has some dysphagia near her clavicular notch.  She has been experiencing dark stools for the past few years.  Last bowel movement was 2 days prior and melanic per patient.  She has some right lower quadrant pain she attributes to constipation, she has some epigastric discomfort as well.  Past Medical History:  Diagnosis Date   Allergy    rhinitis   Aortic stenosis    mild by echo 06/2017   Arthritis    Bradycardia    a. 10/2017 -> beta blocker cut back due to HR 39.   Breast cancer (HCC) 01/06/2012   Cancer (HCC)    right colon and left breast   Chronic diastolic CHF (congestive heart failure) (HCC)    Colon cancer (HCC) 01/06/2012   Colovesical fistula    Dr. Carolynne Edouard and Dr. Laverle Patter planning surgery 08/2018- surgery revealed spontaneous closure   COPD (chronic obstructive pulmonary disease) (HCC)    pt. denies   Coronary artery disease 2006   a.  NSTEMI in 2016, cath showed 15% prox-mid RCA, 20% prox LAD, EF 25-35% by cath and 35-40% -> felt due to Takotsubo cardiomyopathy.   Dilated aortic root (HCC)    by echo 06/2017   Diverticulosis    Dyspnea    Edema extremities    GERD (gastroesophageal reflux disease)    Hernia    Hiatal hernia    denies   Hyperlipidemia    Hypertension    Mild aortic stenosis    echo 11/2015 but not noted on echo 06/2016   Osteopenia    Permanent atrial fibrillation (HCC)    chronic atrial fibrillation   Pneumonia    hx child   Pulmonary HTN (HCC)    a. moderate to severe PASP echo 11/2015 - now by echo 06/2017. CTA chest in 11/16 with no PE. PFTs in 7/15 with mild obstructive lung disease. She had a negative sleep study in 2017. b. Felt due to left sided HF.    Stroke [facility])    Takotsubo syndrome 07/29/2015   a. EF 35-40% by echo; akinesis of mid-apical anteroseptal and apical myocardium.  EF now normalized on echo 11/2015   Past Surgical History:  Procedure Laterality Date   APPENDECTOMY     BREAST SURGERY     lumpectomy left   CARDIAC CATHETERIZATION     CARDIAC CATHETERIZATION N/A 07/28/2015   Procedure: Left Heart Cath and Coronary Angiography;  Surgeon: Peter M [place]az[place], MD;  Location: [facility] INVASIVE CV LAB;  Service: Cardiovascular;  Laterality: N/A;   CHOLECYSTECTOMY     COLECTOMY     right side   CYSTOSCOPY WITH STENT PLACEMENT Left 09/04/2018   Procedure: CYSTOSCOPY WITH LEFT STENT PLACEMENT, BLADDER REPAIR, CYSTOSCOPY WITH LEFT STENT REMOVAL;  Surgeon: Heloise Purpura, MD;  Location: [facility] ORS;  Service: Urology;  Laterality: Left;   EXCISION OF ACCESSORY NIPPLE Bilateral 05/30/2013   Procedure: BILATERAL NIPPLE BIOPSY;  Surgeon: Robyne Askew, MD;  Location: [facility] OR;  Service: General;  Laterality: Bilateral;   EYE SURGERY Bilateral 12   cataracts   HYSTEROSCOPY WITH D & C N/A 05/29/2020   Procedure: DILATATION AND CURETTAGE /HYSTEROSCOPY;  Surgeon: Natale Milch, MD;  Location: [facility] ORS;  Service: Gynecology;  Laterality: N/A;   IR KYPHO EA ADDL LEVEL THORACIC OR LUMBAR  11/22/2022   IR KYPHO LUMBAR INC FX REDUCE BONE BX UNI/BIL CANNULATION INC/IMAGING  11/22/2022   IR RADIOLOGIST EVAL & MGMT  07/26/2017   LAPAROSCOPIC RIGHT COLECTOMY N/A 09/04/2018   Procedure: LAPAROSCOPIC ASSISTED SIGMOID COLECTOMY WITH REPAIR OF FISTULA TO BLADDER;  Surgeon: Griselda Miner, MD;  Location: [facility] ORS;  Service: General;  Laterality: N/A;   RIGHT/LEFT HEART CATH AND CORONARY ANGIOGRAPHY N/A 12/10/2020   Procedure: RIGHT/LEFT HEART CATH AND CORONARY ANGIOGRAPHY;  Surgeon: Laurey Morale, MD;  Location: [facility] INVASIVE CV LAB;  Service: Cardiovascular;  Laterality: N/A;   SPLIT NIGHT STUDY  02/02/2016       Current Facility-Administered Medications  Medication  Dose Route Frequency Provider Last Rate Last Admin   0.9 %  sodium chloride infusion   Intravenous Continuous [place], Eric J, DO 100 mL/hr at 01/14/23 1610 New Bag at 01/14/23 9604   acetaminophen (TYLENOL) tablet 650 mg  650 mg Oral Q6H PRN Gowens, Mariah L, PA-C   650 mg at 01/11/23 0340   acetaminophen (TYLENOL) tablet 650 mg  650 mg Oral BID Marinda Elk, MD   650 mg at 01/14/23 1037   alum & mag hydroxide-simeth (MAALOX/MYLANTA) 200-200-20 MG/5ML suspension 30 mL  30 mL Oral Q6H PRN Marinda Elk, MD   30 mL at 01/11/23 1841   calcitonin (salmon) (MIACALCIN/FORTICAL) nasal spray 1 spray  1 spray Alternating Nares Daily Smoot, Sarah A, PA-C   1 spray at 01/14/23 1038   clonazePAM (KLONOPIN) tablet 0.5 mg  0.5 mg Oral BID PRN Howerter, Justin B, DO   0.5 mg at 01/13/23 2131   cloNIDine (CATAPRES) tablet 0.2 mg  0.2 mg Oral BID Howerter, Justin B, DO   0.2 mg at 01/14/23 1037   docusate sodium (COLACE) capsule 100 mg  100 mg Oral BID Howerter, Justin B, DO   100 mg at 01/14/23 1037   hydrALAZINE (APRESOLINE) tablet 25 mg  25 mg Oral Q6H PRN [place], Eric J, DO       melatonin tablet 3 mg  3 mg Oral QHS PRN Howerter, Justin B, DO   3 mg at 01/13/23 2131   methocarbamol (ROBAXIN) tablet 500 mg  500 mg Oral Q6H PRN Howerter, Justin B, DO   500 mg at 01/13/23 1738   mometasone-formoterol (DULERA) 100-5 MCG/ACT inhaler 2 puff  2 puff Inhalation BID Howerter, Justin B, DO   2 puff at 01/14/23 0817   oxyCODONE-acetaminophen (PERCOCET/ROXICET) 5-325 MG per tablet 1 tablet  1 tablet Oral Q4H PRN [place], Eric J, DO   1 tablet at 01/13/23 2130   Or   morphine (PF) 2 MG/ML injection 2 mg  2 mg Intravenous Q4H PRN [place], Alvira Philips, DO   2 mg at 01/14/23 5409   naloxone [facility]) injection 0.4 mg  0.4 mg Intravenous PRN Howerter, Justin B, DO       ondansetron (ZOFRAN) injection 4 mg  4 mg Intravenous Q6H PRN Howerter, Justin B, DO   4 mg at 01/12/23 1601   pantoprazole (PROTONIX) injection 40 mg   40 mg Intravenous Q12H Shalhoub, Deno Lunger, MD   40 mg at 01/14/23 0813   polyethylene glycol (MIRALAX / GLYCOLAX) packet 17 g  17 g Oral BID Vida Rigger, MD   17 g at 01/14/23 1037   sodium chloride tablet 2 g  2 g Oral BID WC [place], Alvira Philips, DO  2 g at 01/14/23 0813   Vitamin D (Ergocalciferol) (DRISDOL) 1.25 MG (50000 UNIT) capsule 50,000 Units  50,000 Units Oral Q7 days Shalhoub, Deno Lunger, MD   50,000 Units at 01/10/23 0900   Facility-Administered Medications Ordered in Other Encounters  Medication Dose Route Frequency Provider Last Rate Last Admin   lactated ringers infusion   Intravenous Continuous PRN Sindy Guadel[place], CRNA   New Bag at 01/14/23 1610   Allergies as of 01/08/2023 - Review Complete 01/08/2023  Allergen Reaction Noted   Contrast media [iodinated contrast media] Hives and Other (See Comments) 01/11/2012   Clonidine derivatives Other (See Comments) 09/22/2015   Statins Other (See Comments) 10/28/2012   Sulfa antibiotics Diarrhea 03/07/2017   Celebrex [celecoxib] Rash 01/05/2012   Isordil [isosorbide nitrate] Itching and Rash 09/11/2015   Tape Itching and Rash 01/11/2012   Review of Systems:  Review of Systems  Respiratory:  Positive for shortness of breath.   Cardiovascular:  Positive for chest pain.  Gastrointestinal:  Positive for abdominal pain and melena. Negative for constipation and diarrhea.    OBJECTIVE:   Temp:  [98.1 F (36.7 C)-98.7 F (37.1 C)] 98.7 F (37.1 C) (05/10 1359) Pulse Rate:  [43-52] 52 (05/10 1359) Resp:  [16-18] 18 (05/10 1359) BP: (128-166)/(47-49) 166/47 (05/10 1359) SpO2:  [95 %-97 %] 97 % (05/10 1359) Weight:  [82.1 kg] 82.1 kg (05/10 0500) Last BM Date : 01/12/23 Physical Exam Constitutional:      General: She is not in acute distress.    Appearance: She is not ill-appearing, toxic-appearing or diaphoretic.  Cardiovascular:     Rate and Rhythm: Normal rate and regular rhythm.     Heart sounds: Murmur heard.  Pulmonary:      Effort: No respiratory distress.     Breath sounds: Normal breath sounds.  Abdominal:     General: Bowel sounds are normal. There is no distension.     Palpations: Abdomen is soft.     Tenderness: There is abdominal tenderness (RLQ, epigastric).  Neurological:     Mental Status: She is alert.     Labs: Recent Labs    01/12/23 0103 01/12/23 1408 01/13/23 0333 01/13/23 1233 01/14/23 0326  WBC 6.5  --  4.9  --  5.0  HGB 7.9*   < > 7.5* 8.9* 7.5*  HCT 24.7*   < > 24.0* 28.0* 24.0*  PLT 178  --  183  --  187   < > = values in this interval not displayed.   BMET Recent Labs    01/12/23 0103 01/12/23 2113 01/13/23 0333 01/13/23 1233 01/14/23 0326  NA 123*   < > 125* 127* 127*  K 4.8  --  5.0  --  5.1  CL 88*  --  93*  --  98  CO2 28  --  26  --  22  GLUCOSE 94  --  104*  --  104*  BUN 40*  --  34*  --  30*  CREATININE 1.32*  --  1.36*  --  1.23*  CALCIUM 8.0*  --  7.9*  --  8.2*   < > = values in this interval not displayed.   LFT Recent Labs    01/12/23 0103  PROT 5.5*  ALBUMIN 2.7*  AST 13*  ALT 18  ALKPHOS 65  BILITOT 0.6   PT/INR No results for input(s): "LABPROT", "INR" in the last 72 hours. Diagnostic imaging: No results found.  IMPRESSION: Melena Iron deficiency anemia Dysphagia  Mechanical fall with acute L5 compression fracture Mechanical fall with right-sided fibular head fracture History atrial fibrillation on Eliquis History colon cancer  -Patient and her son note last colonoscopy was in 2019, I cannot find these records  -Colonoscopy 04/17/15 (Dr. Matthias Hughs) showed descending and sigmoid colon diverticula, prior end-to-end ileocolonic anastomosis Chronic hyponatremia Congestive heart failure Chronic obstructive pulmonary disease  PLAN: -Plan was for EGD today, procedure was postponed to 01/15/2023 given hyponatremia -Your internal medicine management hyponatremia -Trend H/H, transfuse for hemoglobin less than 7 -Continue pantoprazole -Hold  Eliquis -N.p.o. at midnight for EGD 01/15/2023   LOS: 1 day   Liliane Shi, DO [facility]

## 2023-01-14 NOTE — Anesthesia Preprocedure Evaluation (Signed)
Anesthesia Evaluation  Patient identified by MRN, date of birth, ID band Patient awake    Reviewed: Allergy & Precautions, NPO status , Patient's Chart, lab work & pertinent test results  Airway Mallampati: II  TM Distance: >3 FB Neck ROM: Full    Dental  (+) Edentulous Upper, Edentulous Lower, Dental Advisory Given   Pulmonary COPD   Pulmonary exam normal breath sounds clear to auscultation       Cardiovascular hypertension, Pt. on medications pulmonary hypertension+ CAD, + Past MI and +CHF  Normal cardiovascular exam+ dysrhythmias Atrial Fibrillation + Valvular Problems/Murmurs (mild/mod AS) AS  Rhythm:Regular Rate:Normal  TTE 2023  1. Left ventricular ejection fraction, by estimation, is 60-65%. Left  ventricular ejection fraction by 3D volume is 57 %. The left ventricle has  normal function. The left ventricle has no regional wall motion  abnormalities. There is mild concentric left   ventricular hypertrophy and moderate basal septal hypertrophy.   2. Right ventricular systolic function is normal. The right ventricular  size is moderately enlarged. There is moderately elevated pulmonary artery  systolic pressure. The estimated right ventricular systolic pressure is  50.2 mmHg.   3. The mitral valve is normal in structure. Trivial mitral valve  regurgitation. Mild mitral stenosis. The mean mitral valve gradient is 4.0  mmHg. Moderate mitral annular calcification.   4. The AVA by planimetry was 1.42cm2.. The aortic valve is tricuspid.  Aortic valve regurgitation is not visualized. Mild to moderate aortic  valve stenosis. Aortic valve area, by VTI measures 2.36 cm. Aortic valve  mean gradient measures 7.0 mmHg. Aortic   valve Vmax measures 1.76 m/s.   5. The inferior vena cava is normal in size with greater than 50%  respiratory variability, suggesting right atrial pressure of 3 mmHg.   6. Tricuspid valve regurgitation is  mild to moderate.   7. Right atrial size was severely dilated.   Cath 2022 1. Normal RA pressure with elevated PCWP and prominent V-waves.  2. Mild pulmonary venous hypertension.  3. Nonobstructive coronary disease.    Elevated PCWP with prominent V-waves suggestive of significant mitral regurgitation versus significant LV diastolic dysfunction.  I reviewed prior echo, no significant MR.  I suspect this is representative of significant LV diastolic dysfunction.  I am concerned for possibility of cardiac amyloidosis, think patient needs workup for this.      Neuro/Psych  PSYCHIATRIC DISORDERS Anxiety     TIACVA    GI/Hepatic Neg liver ROS, hiatal hernia,GERD  ,,  Endo/Other  negative endocrine ROS    Renal/GU Renal InsufficiencyRenal disease  negative genitourinary   Musculoskeletal  (+) Arthritis ,    Abdominal   Peds  Hematology  (+) Blood dyscrasia (eliquis, last dose 5/4), anemia Lab Results      Component                Value               Date                      WBC                      4.3                 01/15/2023                HGB  7.0 (L)             01/15/2023                HCT                      22.7 (L)            01/15/2023                MCV                      97.8                01/15/2023                PLT                      178                 01/15/2023              Anesthesia Other Findings   Reproductive/Obstetrics                             Anesthesia Physical Anesthesia Plan  ASA: 3  Anesthesia Plan: MAC   Post-op Pain Management:    Induction: Intravenous  PONV Risk Score and Plan: Propofol infusion and Treatment Beeck vary due to age or medical condition  Airway Management Planned: Natural Airway  Additional Equipment:   Intra-op Plan:   Post-operative Plan:   Informed Consent: I have reviewed the patients History and Physical, chart, labs and discussed the procedure including  the risks, benefits and alternatives for the proposed anesthesia with the patient or authorized representative who has indicated his/her understanding and acceptance.     Dental advisory given  Plan Discussed with: CRNA  Anesthesia Plan Comments:        Anesthesia Quick Evaluation

## 2023-01-14 NOTE — Progress Notes (Signed)
[facility] Progress Note  SUBJECTIVE:   Interval history: [name] was seen and evaluated today at bedside.  Her son present at bedside as well.  She noted having some intermittent chest discomfort.  She noted having some intermittent shortness of breath.  She has some dysphagia near her clavicular notch.  She has been experiencing dark stools for the past few years.  Last bowel movement was 2 days prior and melanic per patient.  She has some right lower quadrant pain she attributes to constipation, she has some epigastric discomfort as well.  Past Medical History:  Diagnosis Date   Allergy    rhinitis   Aortic stenosis    mild by echo 06/2017   Arthritis    Bradycardia    a. 10/2017 -> beta blocker cut back due to HR 39.   Breast cancer (HCC) 01/06/2012   Cancer (HCC)    right colon and left breast   Chronic diastolic CHF (congestive heart failure) (HCC)    Colon cancer (HCC) 01/06/2012   Colovesical fistula    Dr. Toth and Dr. Borden planning surgery 08/2018- surgery revealed spontaneous closure   COPD (chronic obstructive pulmonary disease) (HCC)    pt. denies   Coronary artery disease 2006   a.  NSTEMI in 2016, cath showed 15% prox-mid RCA, 20% prox LAD, EF 25-35% by cath and 35-40% -> felt due to Takotsubo cardiomyopathy.   Dilated aortic root (HCC)    39mmHg by echo 06/2017   Diverticulosis    Dyspnea    Edema extremities    GERD (gastroesophageal reflux disease)    Hernia    Hiatal hernia    denies   Hyperlipidemia    Hypertension    Mild aortic stenosis    echo 11/2015 but not noted on echo 06/2016   Osteopenia    Permanent atrial fibrillation (HCC)    chronic atrial fibrillation   Pneumonia    hx child   Pulmonary HTN (HCC)    a. moderate to severe PASP 66mmHg echo 11/2015 - now 41mmHg by echo 06/2017. CTA chest in 11/16 with no PE. PFTs in 7/15 with mild obstructive lung disease. She had a negative sleep study in 2017. b. Felt due to left sided HF.    Stroke (HCC)    Takotsubo syndrome 07/29/2015   a. EF 35-40% by echo; akinesis of mid-apical anteroseptal and apical myocardium.  EF now normalized on echo 11/2015   Past Surgical History:  Procedure Laterality Date   APPENDECTOMY     BREAST SURGERY     lumpectomy left   CARDIAC CATHETERIZATION     CARDIAC CATHETERIZATION N/A 07/28/2015   Procedure: Left Heart Cath and Coronary Angiography;  Surgeon: Peter M Jordan, MD;  Location: [facility];  Service: Cardiovascular;  Laterality: N/A;   CHOLECYSTECTOMY     COLECTOMY     right side   CYSTOSCOPY WITH STENT PLACEMENT Left 09/04/2018   Procedure: CYSTOSCOPY WITH LEFT STENT PLACEMENT, BLADDER REPAIR, CYSTOSCOPY WITH LEFT STENT REMOVAL;  Surgeon: Borden, Lester, MD;  Location: [facility];  Service: Urology;  Laterality: Left;   EXCISION OF ACCESSORY NIPPLE Bilateral 05/30/2013   Procedure: BILATERAL NIPPLE BIOPSY;  Surgeon: Paul S Toth III, MD;  Location: [facility];  Service: General;  Laterality: Bilateral;   EYE SURGERY Bilateral 12   cataracts   HYSTEROSCOPY WITH D & C N/A 05/29/2020   Procedure: DILATATION AND CURETTAGE /HYSTEROSCOPY;  Surgeon: Schuman, Christanna R, MD;  Location: [facility];    Service: Gynecology;  Laterality: N/A;   IR KYPHO EA ADDL LEVEL THORACIC OR LUMBAR  11/22/2022   IR KYPHO LUMBAR INC FX REDUCE BONE BX UNI/BIL CANNULATION INC/IMAGING  11/22/2022   IR RADIOLOGIST EVAL & MGMT  07/26/2017   LAPAROSCOPIC RIGHT COLECTOMY N/A 09/04/2018   Procedure: LAPAROSCOPIC ASSISTED SIGMOID COLECTOMY WITH REPAIR OF FISTULA TO BLADDER;  Surgeon: Toth, Paul III, MD;  Location: [facility];  Service: General;  Laterality: N/A;   RIGHT/LEFT HEART CATH AND CORONARY ANGIOGRAPHY N/A 12/10/2020   Procedure: RIGHT/LEFT HEART CATH AND CORONARY ANGIOGRAPHY;  Surgeon: McLean, Dalton S, MD;  Location: [facility];  Service: Cardiovascular;  Laterality: N/A;   SPLIT NIGHT STUDY  02/02/2016       Current Facility-Administered Medications  Medication  Dose Route Frequency Provider Last Rate Last Admin   0.9 %  sodium chloride infusion   Intravenous Continuous Austria, Eric J, DO 100 mL/hr at 01/14/23 0638 New Bag at 01/14/23 0638   acetaminophen (TYLENOL) tablet 650 mg  650 mg Oral Q6H PRN Gowens, Mariah L, PA-C   650 mg at 01/11/23 0340   acetaminophen (TYLENOL) tablet 650 mg  650 mg Oral BID Shalhoub, George J, MD   650 mg at 01/14/23 1037   alum & mag hydroxide-simeth (MAALOX/MYLANTA) 200-200-20 MG/5ML suspension 30 mL  30 mL Oral Q6H PRN Shalhoub, George J, MD   30 mL at 01/11/23 1841   calcitonin (salmon) (MIACALCIN/FORTICAL) nasal spray 1 spray  1 spray Alternating Nares Daily Smoot, Sarah A, PA-C   1 spray at 01/14/23 1038   clonazePAM (KLONOPIN) tablet 0.5 mg  0.5 mg Oral BID PRN Howerter, Justin B, DO   0.5 mg at 01/13/23 2131   cloNIDine (CATAPRES) tablet 0.2 mg  0.2 mg Oral BID Howerter, Justin B, DO   0.2 mg at 01/14/23 1037   docusate sodium (COLACE) capsule 100 mg  100 mg Oral BID Howerter, Justin B, DO   100 mg at 01/14/23 1037   hydrALAZINE (APRESOLINE) tablet 25 mg  25 mg Oral Q6H PRN Austria, Eric J, DO       melatonin tablet 3 mg  3 mg Oral QHS PRN Howerter, Justin B, DO   3 mg at 01/13/23 2131   methocarbamol (ROBAXIN) tablet 500 mg  500 mg Oral Q6H PRN Howerter, Justin B, DO   500 mg at 01/13/23 1738   mometasone-formoterol (DULERA) 100-5 MCG/ACT inhaler 2 puff  2 puff Inhalation BID Howerter, Justin B, DO   2 puff at 01/14/23 0817   oxyCODONE-acetaminophen (PERCOCET/ROXICET) 5-325 MG per tablet 1 tablet  1 tablet Oral Q4H PRN Austria, Eric J, DO   1 tablet at 01/13/23 2130   Or   morphine (PF) 2 MG/ML injection 2 mg  2 mg Intravenous Q4H PRN Austria, Eric J, DO   2 mg at 01/14/23 0637   naloxone (NARCAN) injection 0.4 mg  0.4 mg Intravenous PRN Howerter, Justin B, DO       ondansetron (ZOFRAN) injection 4 mg  4 mg Intravenous Q6H PRN Howerter, Justin B, DO   4 mg at 01/12/23 1601   pantoprazole (PROTONIX) injection 40 mg   40 mg Intravenous Q12H Shalhoub, George J, MD   40 mg at 01/14/23 0813   polyethylene glycol (MIRALAX / GLYCOLAX) packet 17 g  17 g Oral BID Magod, Marc, MD   17 g at 01/14/23 1037   sodium chloride tablet 2 g  2 g Oral BID WC Austria, Eric J, DO     2 g at 01/14/23 0813   Vitamin D (Ergocalciferol) (DRISDOL) 1.25 MG (50000 UNIT) capsule 50,000 Units  50,000 Units Oral Q7 days Shalhoub, George J, MD   50,000 Units at 01/10/23 0900   Facility-Administered Medications Ordered in Other Encounters  Medication Dose Route Frequency Provider Last Rate Last Admin   lactated ringers infusion   Intravenous Continuous PRN Good, Melanie B, CRNA   New Bag at 01/14/23 0923   Allergies as of 01/08/2023 - Review Complete 01/08/2023  Allergen Reaction Noted   Contrast media [iodinated contrast media] Hives and Other (See Comments) 01/11/2012   Clonidine derivatives Other (See Comments) 09/22/2015   Statins Other (See Comments) 10/28/2012   Sulfa antibiotics Diarrhea 03/07/2017   Celebrex [celecoxib] Rash 01/05/2012   Isordil [isosorbide nitrate] Itching and Rash 09/11/2015   Tape Itching and Rash 01/11/2012   Review of Systems:  Review of Systems  Respiratory:  Positive for shortness of breath.   Cardiovascular:  Positive for chest pain.  Gastrointestinal:  Positive for abdominal pain and melena. Negative for constipation and diarrhea.    OBJECTIVE:   Temp:  [98.1 F (36.7 C)-98.7 F (37.1 C)] 98.7 F (37.1 C) (05/10 1359) Pulse Rate:  [43-52] 52 (05/10 1359) Resp:  [16-18] 18 (05/10 1359) BP: (128-166)/(47-49) 166/47 (05/10 1359) SpO2:  [95 %-97 %] 97 % (05/10 1359) Weight:  [82.1 kg] 82.1 kg (05/10 0500) Last BM Date : 01/12/23 Physical Exam Constitutional:      General: She is not in acute distress.    Appearance: She is not ill-appearing, toxic-appearing or diaphoretic.  Cardiovascular:     Rate and Rhythm: Normal rate and regular rhythm.     Heart sounds: Murmur heard.  Pulmonary:      Effort: No respiratory distress.     Breath sounds: Normal breath sounds.  Abdominal:     General: Bowel sounds are normal. There is no distension.     Palpations: Abdomen is soft.     Tenderness: There is abdominal tenderness (RLQ, epigastric).  Neurological:     Mental Status: She is alert.     Labs: Recent Labs    01/12/23 0103 01/12/23 1408 01/13/23 0333 01/13/23 1233 01/14/23 0326  WBC 6.5  --  4.9  --  5.0  HGB 7.9*   < > 7.5* 8.9* 7.5*  HCT 24.7*   < > 24.0* 28.0* 24.0*  PLT 178  --  183  --  187   < > = values in this interval not displayed.   BMET Recent Labs    01/12/23 0103 01/12/23 2113 01/13/23 0333 01/13/23 1233 01/14/23 0326  NA 123*   < > 125* 127* 127*  K 4.8  --  5.0  --  5.1  CL 88*  --  93*  --  98  CO2 28  --  26  --  22  GLUCOSE 94  --  104*  --  104*  BUN 40*  --  34*  --  30*  CREATININE 1.32*  --  1.36*  --  1.23*  CALCIUM 8.0*  --  7.9*  --  8.2*   < > = values in this interval not displayed.   LFT Recent Labs    01/12/23 0103  PROT 5.5*  ALBUMIN 2.7*  AST 13*  ALT 18  ALKPHOS 65  BILITOT 0.6   PT/INR No results for input(s): "LABPROT", "INR" in the last 72 hours. Diagnostic imaging: No results found.  IMPRESSION: Melena Iron deficiency anemia Dysphagia   Mechanical fall with acute L5 compression fracture Mechanical fall with right-sided fibular head fracture History atrial fibrillation on Eliquis History colon cancer  -Patient and her son note last colonoscopy was in 2019, I cannot find these records  -Colonoscopy 04/17/15 (Dr. Buccini) showed descending and sigmoid colon diverticula, prior end-to-end ileocolonic anastomosis Chronic hyponatremia Congestive heart failure Chronic obstructive pulmonary disease  PLAN: -Plan was for EGD today, procedure was postponed to 01/15/2023 given hyponatremia -Your internal medicine management hyponatremia -Trend H/H, transfuse for hemoglobin less than 7 -Continue pantoprazole -Hold  Eliquis -N.p.o. at midnight for EGD 01/15/2023   LOS: 1 day   Shaunn Tackitt, DO [facility]    

## 2023-01-14 NOTE — Progress Notes (Signed)
PROGRESS NOTE    [name]  ZOX:[id number] DOB: 04-04-1935 DOA: 01/08/2023 PCP: Donita Brooks, MD    Brief Narrative:   [name] is a 88 y.o. female with past medical history significant for coronary artery disease (cath 12/2020 with nonobstructive CAD), L3 and L4 kyphoplasty (11/2022), mild/moderate aortic stenosis (AVA 1.42cm2 via Echo 03/2022), chronic hyponatremia, permanent atrial fibrillation on Eliquis, diastolic congestive heart failure with Takotsubo cardiomyopathy in the past, hypertension, anemia of chronic disease presenting to [facility] emergency department on 5/4 with complaints of fall after tripping and low back pain.   Upon evaluation in the emergency department trauma evaluation included a CTA scan of the right femur revealing an acute nondisplaced fracture across the fibular head and neck junction along with CT imaging of the pelvis identifying an L5 superior endplate central compression fracture with slight posterior superior retropulsion and fragmentation. EDP discussed case with Dr. Shon Baton with orthopedic surgery who agreed to consult.  The hospitalist group was then called to assess the patient for admission to the hospital.   Dr. Shon Baton recommended TLSO bracing and conservative measures for management of the compression fracture.  They additionally felt that the right fibular head fracture likely also did not require surgical intervention.     Hospital course was complicated by development of hyperkalemia on hospital day 3 requiring gentle hydration, multiple doses of Lokelma as well as dextrose and insulin.  Hospital course was also complicated by development of progressive anemia, prompting intravenous Protonix and GI consultation.  Assessment & Plan:   Closed compression fracture of L5 lumbar vertebra, initial encounter  Patient presenting to the ED with acute low back pain following mechanical fall.  MR L-spine without contrast with no acute L5 superior  endplate compression fracture with 20% height loss without retropulsion, L4 compression fracture noted s/p interval cement augmentation with persistent diffuse marrow edema but no progressive height loss, nearly healed L3 compression fracture and unchanged chronic L1/L2 compression deformities; noted worsened moderate spinal canal stenosis.  Orthopedics, Dr. Shon Baton was consulted and recommended TLSO bracing and conservative measures for pain management. -- WBAT, w/ TLSO brain -- Tylenol 650 g p.o. twice daily; q6h PRN mild pain -- Robaxin 5 mg p.o. every 6 hours as needed muscle spasms -- Continue vitamin D supplementation -- Continue therapy efforts while inpatient, pending SNF placement -- Outpatient f/u Dr. Shelle Iron in 2 weeks with repeat Xrays  Closed right fibular fracture Evaluated by orthopedics, Dr. Shon Baton.  Recommend conservative management as this is not weightbearing. -- WBAT with knee immobilizer   Vitamin D deficiency Low vitamin D of 26.24 on this hospitalization despite outpatient supplementation -- started ergocalciferol 50,000 units q. weekly.   Acute blood loss anemia concern for UGIB Patient reports ongoing dark stools, although on oral iron therapy.  Elevated BUN.  Complicated by use of anticoagulation with Eliquis for underlying atrial fibrillation. -- [facility] following, appreciate assistance -- Hgb 9.4>>8.1>8.8>7.9>9.1>7.5>8.9>7.5 -- FOBT + -- holding home Eliquis/iron supplement -- Protonix 40mg  IV q12h -- Repeat CBC in a.m. -- N.p.o. after midnight for EGD tomorrow  Hyperkalemia: Resolved Patient with recurrent hyperkalemia of unknown etiology. Developed hyperkalemia several weeks ago and at that time cardiology had discontinued her spironolactone. Hyperkalemia in the setting of rising BUN, dropping hemoglobin and hematocrit and reports of black stool being about concern for upper gastrointestinal bleeding.  Received Lokelma, D50, insulin and gentle intravenous fluids  with now resolution. -- K 4.3>>5.5>5.7>4.8>5.0 -- Repeat BMP in a.m.  Acute on chronic hyponatremia Reportedly "chronic" hyponatremia however baseline sodium after chart review reveals baseline sodium in the low 130s. Sodium is currently lower than baseline, with concurrent hypochloremia suggesting volume depletion; complicated by home use of torsemide 40 mg p.o. twice daily.  Urine sodium 22, urine osmolality 267. -- Na 131>132>129>>127>126>123>125>127 -- Holding home torsemide -- NS at 100 mL/h -- Sodium chloride 2 g PO BID -- BMP daily   Left ankle pain Ongoing severe left ankle pain that preceded the patient's fall seems to have been increasing as of late Plain film imaging of the ankle performed on admission revealed no evidence of fracture CT imaging additionally reveals no evidence of fracture.  Uric acid level elevated at 12.0.  No physical exam evidence of gout but considering negative imaging and elevated uric acid level received one-time dose of colchicine. -- Will hold off further colchicine dosing due to concern for upper GI bleed as above; if pain to left ankle persists, will consider prednisone initiation  CKD stage IIIb Baseline creatinine 1.2-1.3 over past year. -- Cr 1.12>>1.54>1.33>1.34>1.32>1.36>1.23 -- BMP daily   Persistent atrial fibrillation  -- Holding home Eliquis due to concern of worsening anemia, upper GI bleed   Chronic diastolic CHF (congestive heart failure); compensated No clinical evidence of cardiogenic volume overload -- Strict input and output monitoring -- Daily weights    Essential hypertension -- Clonidine 0.2 mg p.o. twice daily -- Hydralazine 25 mg PO q6h PRN SBP >165  Anxiety -- Clonazepam 0.5 mg p.o. twice daily as needed      DVT prophylaxis: Place and maintain sequential compression device Start: 01/11/23 1334 SCDs Start: 01/08/23 2355    Code Status: Full Code Family Communication:   Disposition Plan:  Level of care:  Med-Surg Status is: Inpatient Remains inpatient appropriate because: IV fluids, pending EGD; plan to DC to SNF thereafter    Consultants:  [facility]  Procedures:  None  Antimicrobials:  None   Subjective: Patient seen examined bedside, resting comfortably.  Lying in bed.  Back pain continues.  EGD canceled today with plan to attempt tomorrow due to sodium being low per anesthesiologist.  Continues to report dark stools; none yesterday.  Continues to slowly improve.  No other specific questions or concerns at this time.  Denies headache, no dizziness, no chest pain, no palpitations, no shortness of breath, no abdominal pain, no fever/chills/night sweats, no nausea/vomiting/diarrhea.  No acute events overnight per nursing staff.  Objective: Vitals:   01/13/23 2255 01/14/23 0500 01/14/23 0532 01/14/23 0817  BP: (!) 128/49  (!) 131/49   Pulse: (!) 48  (!) 43   Resp: 16  16   Temp: 98.2 F (36.8 C)  98.1 F (36.7 C)   TempSrc: Oral  Oral   SpO2: 95%  96% 97%  Weight:  82.1 kg    Height:        Intake/Output Summary (Last 24 hours) at 01/14/2023 1314 Last data filed at 01/14/2023 1045 Gross per 24 hour  Intake 1320.06 ml  Output 900 ml  Net 420.06 ml   Filed Weights   01/09/23 0254 01/12/23 0500 01/14/23 0500  Weight: 73.2 kg 75.2 kg 82.1 kg    Examination:  Physical Exam: GEN: NAD, alert and oriented x 3, chronically ill/elderly in appearance HEENT: NCAT, PERRL, EOMI, sclera clear, MMM PULM: CTAB w/o wheezes/crackles, normal respiratory effort, on room air CV: RRR w/o M/G/R GI: abd soft, NTND, NABS, no R/G/M MSK: no peripheral edema, moves all extremities independently  NEURO: No focal neurological deficits noted PSYCH: normal mood/affect Integumentary: dry/intact, no rashes or wounds    Data Reviewed: I have personally reviewed following labs and imaging studies  CBC: Recent Labs  Lab 01/09/23 0325 01/10/23 0308 01/11/23 0316 01/11/23 1738  01/12/23 0103 01/12/23 1408 01/13/23 0333 01/13/23 1233 01/14/23 0326  WBC 6.9 7.6 6.5 7.7 6.5  --  4.9  --  5.0  NEUTROABS 4.6 5.2 3.8  --  4.0  --   --   --   --   HGB 8.7* 8.4* 8.1* 8.8* 7.9* 9.1* 7.5* 8.9* 7.5*  HCT 27.1* 26.4* 25.5* 27.9* 24.7* 28.8* 24.0* 28.0* 24.0*  MCV 96.4 97.8 95.9 97.2 95.4  --  98.0  --  97.2  PLT 206 187 178 203 178  --  183  --  187   Basic Metabolic Panel: Recent Labs  Lab 01/09/23 0325 01/09/23 0326 01/10/23 0308 01/11/23 0316 01/11/23 1738 01/12/23 0103 01/12/23 2113 01/13/23 0333 01/13/23 1233 01/14/23 0326  NA 132*  --  129* 127* 126* 123* 125* 125* 127* 127*  K 4.3  --  4.5 5.5* 5.7* 4.8  --  5.0  --  5.1  CL 94*  --  89* 90* 89* 88*  --  93*  --  98  CO2 31  --  33* 31 28 28   --  26  --  22  GLUCOSE 122*  --  136* 118* 133* 94  --  104*  --  104*  BUN 36*  --  38* 45* 41* 40*  --  34*  --  30*  CREATININE 1.13*  --  1.54* 1.33* 1.34* 1.32*  --  1.36*  --  1.23*  CALCIUM 8.7*  --  8.3* 8.2* 8.4* 8.0*  --  7.9*  --  8.2*  MG 2.8* 2.8* 3.2* 3.0*  --  2.8*  --   --   --   --    GFR: Estimated Creatinine Clearance: 34.7 mL/min (A) (by C-G formula based on SCr of 1.23 mg/dL (H)). Liver Function Tests: Recent Labs  Lab 01/09/23 0325 01/10/23 0308 01/11/23 0316 01/12/23 0103  AST 22 20 16  13*  ALT 26 26 21 18   ALKPHOS 68 72 72 65  BILITOT 0.7 0.7 0.6 0.6  PROT 5.9* 5.9* 5.5* 5.5*  ALBUMIN 3.0* 2.9* 2.8* 2.7*   No results for input(s): "LIPASE", "AMYLASE" in the last 168 hours. No results for input(s): "AMMONIA" in the last 168 hours. Coagulation Profile: No results for input(s): "INR", "PROTIME" in the last 168 hours. Cardiac Enzymes: No results for input(s): "CKTOTAL", "CKMB", "CKMBINDEX", "TROPONINI" in the last 168 hours. BNP (last 3 results) No results for input(s): "PROBNP" in the last 8760 hours. HbA1C: No results for input(s): "HGBA1C" in the last 72 hours.  CBG: No results for input(s): "GLUCAP" in the last 168  hours. Lipid Profile: No results for input(s): "CHOL", "HDL", "LDLCALC", "TRIG", "CHOLHDL", "LDLDIRECT" in the last 72 hours. Thyroid Function Tests: No results for input(s): "TSH", "T4TOTAL", "FREET4", "T3FREE", "THYROIDAB" in the last 72 hours. Anemia Panel: No results for input(s): "VITAMINB12", "FOLATE", "FERRITIN", "TIBC", "IRON", "RETICCTPCT" in the last 72 hours.  Sepsis Labs: No results for input(s): "PROCALCITON", "LATICACIDVEN" in the last 168 hours.  No results found for this or any previous visit (from the past 240 hour(s)).       Radiology Studies: No results found.      Scheduled Meds:  acetaminophen  650 mg Oral BID   calcitonin (  salmon)  1 spray Alternating Nares Daily   cloNIDine  0.2 mg Oral BID   docusate sodium  100 mg Oral BID   mometasone-formoterol  2 puff Inhalation BID   pantoprazole (PROTONIX) IV  40 mg Intravenous Q12H   polyethylene glycol  17 g Oral BID   sodium chloride  2 g Oral BID WC   Vitamin D (Ergocalciferol)  50,000 Units Oral Q7 days   Continuous Infusions:  sodium chloride 100 mL/hr at 01/14/23 0638     LOS: 1 day    Time spent: 51 minutes spent on chart review, discussion with nursing staff, consultants, updating family and interview/physical exam; more than 50% of that time was spent in counseling and/or coordination of care.    Alvira Philips [place], DO [facility] Available via Epic secure chat 7am-7pm After these hours, please refer to coverage provider listed on [email or web address] 01/14/2023, 1:14 PM

## 2023-01-14 NOTE — Progress Notes (Signed)
OT Cancellation Note  Patient Details Name: [name] MRN: [id number] DOB: Jul 01, 1935   Cancelled Treatment:    Reason Eval/Treat Not Completed: Patient at procedure or test/ unavailable. Pt. Having EGC today.  Will check back as able.    Alessandra Bevels Lorraine-COTA/L 01/14/2023, 11:42 AM

## 2023-01-15 ENCOUNTER — Inpatient Hospital Stay (HOSPITAL_COMMUNITY): Payer: Medicare Other | Admitting: Certified Registered"

## 2023-01-15 ENCOUNTER — Other Ambulatory Visit: Payer: Medicare Other

## 2023-01-15 ENCOUNTER — Encounter (HOSPITAL_COMMUNITY)
Admission: EM | Disposition: A | Discharge status: Skilled Nursing Facility | Payer: Self-pay | Source: Home / Self Care | Attending: Internal Medicine

## 2023-01-15 ENCOUNTER — Encounter (HOSPITAL_COMMUNITY): Payer: Self-pay | Admitting: Internal Medicine

## 2023-01-15 DIAGNOSIS — K2289 Other specified disease of esophagus: Secondary | ICD-10-CM

## 2023-01-15 DIAGNOSIS — K31811 Angiodysplasia of stomach and duodenum with bleeding: Secondary | ICD-10-CM

## 2023-01-15 DIAGNOSIS — S32050A Wedge compression fracture of fifth lumbar vertebra, initial encounter for closed fracture: Secondary | ICD-10-CM | POA: Diagnosis not present

## 2023-01-15 DIAGNOSIS — D62 Acute posthemorrhagic anemia: Secondary | ICD-10-CM

## 2023-01-15 DIAGNOSIS — K224 Dyskinesia of esophagus: Secondary | ICD-10-CM

## 2023-01-15 DIAGNOSIS — K2971 Gastritis, unspecified, with bleeding: Secondary | ICD-10-CM

## 2023-01-15 DIAGNOSIS — K317 Polyp of stomach and duodenum: Secondary | ICD-10-CM

## 2023-01-15 DIAGNOSIS — I350 Nonrheumatic aortic (valve) stenosis: Secondary | ICD-10-CM

## 2023-01-15 HISTORY — PX: HOT HEMOSTASIS: SHX5433

## 2023-01-15 HISTORY — PX: ESOPHAGOGASTRODUODENOSCOPY (EGD) WITH PROPOFOL: SHX5813

## 2023-01-15 HISTORY — PX: BIOPSY: SHX5522

## 2023-01-15 LAB — BASIC METABOLIC PANEL
Anion gap: 6 (ref 5–15)
BUN: 27 mg/dL — ABNORMAL HIGH (ref 8–23)
CO2: 22 mmol/L (ref 22–32)
Calcium: 8.4 mg/dL — ABNORMAL LOW (ref 8.9–10.3)
Chloride: 103 mmol/L (ref 98–111)
Creatinine, Ser: 1.11 mg/dL — ABNORMAL HIGH (ref 0.44–1.00)
GFR, Estimated: 48 mL/min — ABNORMAL LOW (ref >60)
Glucose, Bld: 103 mg/dL — ABNORMAL HIGH (ref 70–99)
Potassium: 5 mmol/L (ref 3.5–5.1)
Sodium: 131 mmol/L — ABNORMAL LOW (ref 135–145)

## 2023-01-15 LAB — BPAM RBC
Unit Type and Rh: 5100
Unit Type and Rh: 5100

## 2023-01-15 LAB — CBC
HCT: 22.7 % — ABNORMAL LOW (ref 36.0–46.0)
Hemoglobin: 7 g/dL — ABNORMAL LOW (ref 12.0–15.0)
MCH: 30.2 pg (ref 26.0–34.0)
MCHC: 30.8 g/dL (ref 30.0–36.0)
MCV: 97.8 fL (ref 80.0–100.0)
Platelets: 178 10*3/uL (ref 150–400)
RBC: 2.32 MIL/uL — ABNORMAL LOW (ref 3.87–5.11)
RDW: 14.6 % (ref 11.5–15.5)
WBC: 4.3 10*3/uL (ref 4.0–10.5)
nRBC: 0 % (ref 0.0–0.2)

## 2023-01-15 LAB — PREPARE RBC (CROSSMATCH)

## 2023-01-15 LAB — TYPE AND SCREEN
Unit division: 0
Unit division: 0

## 2023-01-15 SURGERY — ESOPHAGOGASTRODUODENOSCOPY (EGD) WITH PROPOFOL
Anesthesia: Monitor Anesthesia Care

## 2023-01-15 MED ORDER — PROPOFOL 10 MG/ML IV BOLUS
INTRAVENOUS | Status: AC
  Filled 2023-01-15: qty 20

## 2023-01-15 MED ORDER — SODIUM CHLORIDE 0.9% IV SOLUTION: Freq: Once | INTRAVENOUS | Status: DC

## 2023-01-15 MED ORDER — PROPOFOL 500 MG/50ML IV EMUL
INTRAVENOUS | Status: DC | PRN
  Administered 2023-01-15: 75 ug/kg/min via INTRAVENOUS

## 2023-01-15 MED ORDER — PROPOFOL 1000 MG/100ML IV EMUL
INTRAVENOUS | Status: AC
  Filled 2023-01-15: qty 100

## 2023-01-15 MED ORDER — LIDOCAINE 2% (20 MG/ML) 5 ML SYRINGE
INTRAMUSCULAR | Status: DC | PRN
  Administered 2023-01-15: 80 mg via INTRAVENOUS

## 2023-01-15 MED ORDER — SODIUM CHLORIDE 0.9 % IV SOLN: INTRAVENOUS | Status: DC

## 2023-01-15 MED ORDER — PROPOFOL 10 MG/ML IV BOLUS
INTRAVENOUS | Status: DC | PRN
  Administered 2023-01-15 (×2): 10 mg via INTRAVENOUS
  Administered 2023-01-15: 20 mg via INTRAVENOUS
  Administered 2023-01-15: 10 mg via INTRAVENOUS
  Administered 2023-01-15 (×3): 20 mg via INTRAVENOUS

## 2023-01-15 MED ORDER — EPHEDRINE SULFATE-NACL 50-0.9 MG/10ML-% IV SOSY
PREFILLED_SYRINGE | INTRAVENOUS | Status: DC | PRN
  Administered 2023-01-15: 5 mg via INTRAVENOUS

## 2023-01-15 SURGICAL SUPPLY — 15 items

## 2023-01-15 NOTE — Op Note (Signed)
[facility] Patient Name: [name] Procedure Date: 01/15/2023 MRN: [id number] Attending MD: Liliane Shi DO, DO, [id number] Date of Birth: [birth year] CSN: [id number] Age: 88 Admit Type: Inpatient Procedure:                Upper GI endoscopy Indications:              Epigastric abdominal pain, Acute post hemorrhagic                            anemia, Melena Providers:                Liliane Shi DO, DO, Adolph Pollack, RN,                            Kandice Robinsons, Technician Referring MD:              Medicines:                See the Anesthesia note for documentation of the                            administered medications Complications:            No immediate complications. Estimated Blood Loss:     Estimated blood loss was minimal. Procedure:                Pre-Anesthesia Assessment:                           - ASA Grade Assessment: III - A patient with severe                            systemic disease.                           - The risks and benefits of the procedure and the                            sedation options and risks were discussed with the                            patient. All questions were answered and informed                            consent was obtained.                           After obtaining informed consent, the endoscope was                            passed under direct vision. Throughout the                            procedure, the patient's blood pressure, pulse, and                            oxygen saturations were monitored continuously. The  [id number]-[id number] ([id number]) Olympus endoscope was introduced                            through the mouth, and advanced to the second part                            of duodenum. The upper GI endoscopy was                            accomplished without difficulty. The patient                            tolerated the procedure well. Scope In: Scope  Out: Findings:      The Z-line was irregular and was found 40 cm from the incisors.      Abnormal motility was noted at the lower esophageal sphincter. The       distal esophagus/lower esophageal sphincter is spastic, but gives up       passage to the endoscope. Biopsies were obtained from the proximal and       distal esophagus with cold forceps for histology of possible       eosinophilic esophagitis.      A few diminutive sessile polyps with no bleeding and no stigmata of       recent bleeding were found in the gastric body.      Localized mild inflammation characterized by congestion (edema) and       erythema was found in the gastric antrum. Biopsies were taken with a       cold forceps for Helicobacter pylori testing.      A single 2 mm angiodysplastic lesion without bleeding was found in the       first portion of the duodenum. Coagulation for bleeding prevention using       argon plasma at 0.8 liters/minute and 30 watts was successful. Impression:               - Z-line irregular, 40 cm from the incisors.                           - Abnormal esophageal motility.                           - A few gastric polyps.                           - Gastritis. Biopsied.                           - A single non-bleeding angiodysplastic lesion in                            the duodenum. Treated with argon plasma coagulation                            (APC).                           - Biopsies were taken with a cold forceps for  evaluation of eosinophilic esophagitis. Moderate Sedation:      See the other procedure note for documentation of moderate sedation with       intraservice time. Recommendation:           - Return patient to hospital ward for ongoing care.                           - Resume regular diet.                           - Continue present medications.                           - Await pathology results.                           - Perform a small  bowel enteroscopy (SBE) tomorrow.                           - Perform ambulatory esophageal manometry in 2                            months. Procedure Code(s):        --- Professional ---                           [phone], Esophagogastroduodenoscopy, flexible,                            transoral; with biopsy, single or multiple Diagnosis Code(s):        --- Professional ---                           K22.89, Other specified disease of esophagus                           K22.4, Dyskinesia of esophagus                           K31.7, Polyp of stomach and duodenum                           K29.70, Gastritis, unspecified, without bleeding                           R10.13, Epigastric pain                           K31.819, Angiodysplasia of stomach and duodenum                            without bleeding                           K92.1, Melena (includes Hematochezia)                           D62, Acute posthemorrhagic anemia CPT copyright 2022  American Medical Association. All rights reserved. The codes documented in this report are preliminary and upon coder review Hindle  be revised to meet current compliance requirements. Dr Liliane Shi, DO Liliane Shi DO, DO 01/15/2023 8:38:36 AM Number of Addenda: 0

## 2023-01-15 NOTE — Anesthesia Preprocedure Evaluation (Addendum)
Anesthesia Evaluation  Patient identified by MRN, date of birth, ID band Patient awake    Reviewed: Allergy & Precautions, NPO status , Patient's Chart, lab work & pertinent test results  History of Anesthesia Complications Negative for: history of anesthetic complications  Airway Mallampati: II  TM Distance: >3 FB Neck ROM: Full    Dental no notable dental hx. (+) Edentulous Lower, Edentulous Upper   Pulmonary COPD   Pulmonary exam normal        Cardiovascular hypertension, pulmonary hypertension+ CAD, + Past MI and +CHF  Normal cardiovascular exam+ dysrhythmias Atrial Fibrillation   TTE 03/2022: EF 60-65%, mild LVH, severe RAE, moderate pHTN (RVSP 50.72mmHg), mild to moderate AS (AVA 2.36 cm, mean gradient , Vmax 1.76 m/s)     Neuro/Psych   Anxiety     CVA    GI/Hepatic Neg liver ROS, hiatal hernia,GERD  ,,  Endo/Other  negative endocrine ROS    Renal/GU Renal disease  negative genitourinary   Musculoskeletal  (+) Arthritis ,    Abdominal   Peds  Hematology  (+) Blood dyscrasia (Hgb 8.3), anemia   Anesthesia Other Findings Day of surgery medications reviewed with patient.  Reproductive/Obstetrics                              Anesthesia Physical Anesthesia Plan  ASA: 3  Anesthesia Plan: MAC   Post-op Pain Management: Minimal or no pain anticipated   Induction:   PONV Risk Score and Plan: 2 and Propofol infusion, TIVA and Treatment Soulliere vary due to age or medical condition  Airway Management Planned: Natural Airway and Nasal Cannula  Additional Equipment: None  Intra-op Plan:   Post-operative Plan:   Informed Consent: I have reviewed the patients History and Physical, chart, labs and discussed the procedure including the risks, benefits and alternatives for the proposed anesthesia with the patient or authorized representative who has indicated his/her understanding and  acceptance.       Plan Discussed with: CRNA  Anesthesia Plan Comments:          Anesthesia Quick Evaluation

## 2023-01-15 NOTE — Transfer of Care (Signed)
Immediate Anesthesia Transfer of Care Note  Patient: [name]  Procedure(s) Performed: ESOPHAGOGASTRODUODENOSCOPY (EGD) WITH PROPOFOL BIOPSY HOT HEMOSTASIS (ARGON PLASMA COAGULATION/BICAP)  Patient Location: PACU  Anesthesia Type:MAC  Level of Consciousness: drowsy and patient cooperative  Airway & Oxygen Therapy: Patient Spontanous Breathing and Patient connected to face mask oxygen  Post-op Assessment: Report given to RN and Post -op Vital signs reviewed and stable  Post vital signs: Reviewed and stable  Last Vitals:  Vitals Value Taken Time  BP 127/40 01/15/23 0834  Temp 36.7 C 01/15/23 0834  Pulse 53 01/15/23 0839  Resp 18 01/15/23 0839  SpO2 100 % 01/15/23 0839  Vitals shown include unvalidated device data.  Last Pain:  Vitals:   01/15/23 0834  TempSrc:   PainSc: 0-No pain      Patients Stated Pain Goal: 3 (01/13/23 0350)  Complications: No notable events documented.

## 2023-01-15 NOTE — Anesthesia Postprocedure Evaluation (Signed)
Anesthesia Post Note  Patient: [name]  Procedure(s) Performed: ESOPHAGOGASTRODUODENOSCOPY (EGD) WITH PROPOFOL BIOPSY HOT HEMOSTASIS (ARGON PLASMA COAGULATION/BICAP)     Patient location during evaluation: PACU Anesthesia Type: MAC Level of consciousness: awake and alert Pain management: pain level controlled Vital Signs Assessment: post-procedure vital signs reviewed and stable Respiratory status: spontaneous breathing, nonlabored ventilation, respiratory function stable and patient connected to nasal cannula oxygen Cardiovascular status: stable and blood pressure returned to baseline Postop Assessment: no apparent nausea or vomiting Anesthetic complications: no  No notable events documented.  Last Vitals:  Vitals:   01/15/23 0843 01/15/23 0853  BP: (!) 135/38 (!) 148/50  Pulse: (!) 56 64  Resp: 17 16  Temp:    SpO2: 100% 100%    Last Pain:  Vitals:   01/15/23 0853  TempSrc:   PainSc: 8                  Daisuke Bailey L Siyon Linck

## 2023-01-15 NOTE — Progress Notes (Signed)
PROGRESS NOTE    [name]  ZOX:[id number] DOB: 05/05/1935 DOA: 01/08/2023 PCP: Donita Brooks, MD    Brief Narrative:   [name] is a 88 y.o. female with past medical history significant for coronary artery disease (cath 12/2020 with nonobstructive CAD), L3 and L4 kyphoplasty (11/2022), mild/moderate aortic stenosis (AVA 1.42cm2 via Echo 03/2022), chronic hyponatremia, permanent atrial fibrillation on Eliquis, diastolic congestive heart failure with Takotsubo cardiomyopathy in the past, hypertension, anemia of chronic disease presenting to [facility] emergency department on 5/4 with complaints of fall after tripping and low back pain.   Upon evaluation in the emergency department trauma evaluation included a CTA scan of the right femur revealing an acute nondisplaced fracture across the fibular head and neck junction along with CT imaging of the pelvis identifying an L5 superior endplate central compression fracture with slight posterior superior retropulsion and fragmentation. EDP discussed case with Dr. Shon Baton with orthopedic surgery who agreed to consult.  The hospitalist group was then called to assess the patient for admission to the hospital.   Dr. Shon Baton recommended TLSO bracing and conservative measures for management of the compression fracture.  They additionally felt that the right fibular head fracture likely also did not require surgical intervention.     Hospital course was complicated by development of hyperkalemia on hospital day 3 requiring gentle hydration, multiple doses of Lokelma as well as dextrose and insulin.  Hospital course was also complicated by development of progressive anemia, prompting intravenous Protonix and GI consultation.  Assessment & Plan:   Closed compression fracture of L5 lumbar vertebra, initial encounter  Patient presenting to the ED with acute low back pain following mechanical fall.  MR L-spine without contrast with no acute L5 superior  endplate compression fracture with 20% height loss without retropulsion, L4 compression fracture noted s/p interval cement augmentation with persistent diffuse marrow edema but no progressive height loss, nearly healed L3 compression fracture and unchanged chronic L1/L2 compression deformities; noted worsened moderate spinal canal stenosis.  Orthopedics, Dr. Shon Baton was consulted and recommended TLSO bracing and conservative measures for pain management. -- WBAT, w/ TLSO brain -- Tylenol 650 g p.o. twice daily; q6h PRN mild pain -- Robaxin 5 mg p.o. every 6 hours as needed muscle spasms -- Continue vitamin D supplementation -- Continue therapy efforts while inpatient, pending SNF placement -- Outpatient f/u Dr. Shelle Iron in 2 weeks with repeat Xrays  Closed right fibular fracture Evaluated by orthopedics, Dr. Shon Baton.  Recommend conservative management as this is not weightbearing. -- WBAT with knee immobilizer   Vitamin D deficiency Low vitamin D of 26.24 on this hospitalization despite outpatient supplementation -- started ergocalciferol 50,000 units q. weekly.   Acute blood loss anemia 2/2 UGIB/AVM Patient reports ongoing dark stools, although on oral iron therapy.  Elevated BUN.  Complicated by use of anticoagulation with Eliquis for underlying atrial fibrillation.  GI was consulted and patient underwent EGD on 01/15/2023 with finding of an AVM Forst portion of duodenum s/p argon plasma coagulation. -- [facility] following, appreciate assistance -- Hgb 9.4>>8.1>8.8>7.9>9.1>7.5>8.9>7.5>7.0 -- FOBT + -- holding home Eliquis/iron supplement -- Transfuse 1 unit PRBC today, H&H following transfusion -- Protonix 40mg  IV q12h -- Repeat CBC in a.m. -- N.p.o. after midnight for small bowel enteroscopy tomorrow  Hyperkalemia: Resolved Patient with recurrent hyperkalemia of unknown etiology. Developed hyperkalemia several weeks ago and at that time cardiology had discontinued her spironolactone.  Hyperkalemia in the setting of rising BUN, dropping hemoglobin and hematocrit and  reports of black stool being about concern for upper gastrointestinal bleeding.  Received Lokelma, D50, insulin and gentle intravenous fluids with now resolution. -- K 4.3>>5.5>5.7>4.8>5.0 -- Repeat BMP in a.m.   Acute on chronic hyponatremia Reportedly "chronic" hyponatremia however baseline sodium after chart review reveals baseline sodium in the low 130s. Sodium is currently lower than baseline, with concurrent hypochloremia suggesting volume depletion; complicated by home use of torsemide 40 mg p.o. twice daily.  Urine sodium 22, urine osmolality 267. -- Na 131>132>129>>127>126>123>125>127>131 -- Holding home torsemide -- NS at 100 mL/h -- Sodium chloride 2 g PO BID -- BMP daily   Left ankle pain Ongoing severe left ankle pain that preceded the patient's fall seems to have been increasing as of late Plain film imaging of the ankle performed on admission revealed no evidence of fracture CT imaging additionally reveals no evidence of fracture.  Uric acid level elevated at 12.0.  No physical exam evidence of gout but considering negative imaging and elevated uric acid level received one-time dose of colchicine. -- Will hold off further colchicine dosing due to concern for upper GI bleed as above; if pain to left ankle persists, will consider prednisone initiation  CKD stage IIIb Baseline creatinine 1.2-1.3 over past year. -- Cr 1.12>>1.54>1.33>1.34>1.32>1.36>1.23>1.11 -- BMP daily   Persistent atrial fibrillation  -- Holding home Eliquis due anemia, upper GI bleed   Chronic diastolic CHF (congestive heart failure); compensated No clinical evidence of cardiogenic volume overload -- Strict input and output monitoring -- Daily weights    Essential hypertension -- Clonidine 0.2 mg p.o. twice daily -- Hydralazine 25 mg PO q6h PRN SBP >165  Anxiety -- Clonazepam 0.5 mg p.o. twice daily as needed       DVT prophylaxis: Place and maintain sequential compression device Start: 01/11/23 1334 SCDs Start: 01/08/23 08-Feb-2354    Code Status: Full Code Family Communication:   Disposition Plan:  Level of care: Med-Surg Status is: Inpatient Remains inpatient appropriate because: IV fluids, pending small bowel enteroscopy tomorrow; plan to DC to SNF once hemoglobin stable    Consultants:  [facility]  Procedures:  None  Antimicrobials:  None   Subjective: Patient seen examined bedside, resting comfortably.  Lying in bed.  Remains with back discomfort, underwent EGD this morning with finding of AVM in the duodenum s/p APC.  GI plans small bowel enteroscopy again tomorrow.  Transfusing 1 unit PRBC today for downtrending hemoglobin to 7.0.  Sodium continues to improve.  Patient states it is her wedding anniversary today, unfortunately her husband passed away last 09-Feb-2023.  No other specific questions or concerns at this time.  Denies headache, no dizziness, no chest pain, no palpitations, no shortness of breath, no abdominal pain, no fever/chills/night sweats, no nausea/vomiting/diarrhea.  No acute events overnight per nursing staff.  Objective: Vitals:   01/15/23 0834 01/15/23 0843 01/15/23 0853 01/15/23 0917  BP: (!) 127/40 (!) 135/38 (!) 148/50 (!) 163/59  Pulse: (!) 57 (!) 56 64 63  Resp: 20 17 16 16   Temp: 98.1 F (36.7 C)   98.3 F (36.8 C)  TempSrc:    Oral  SpO2: 100% 100% 100% 92%  Weight:      Height:        Intake/Output Summary (Last 24 hours) at 01/15/2023 1126 Last data filed at 01/15/2023 1100 Gross per 24 hour  Intake 2061.06 ml  Output 500 ml  Net 1561.06 ml   Filed Weights   01/12/23 0500 01/14/23 0500 01/15/23 0422  Weight:  75.2 kg 82.1 kg 85.6 kg    Examination:  Physical Exam: GEN: NAD, alert and oriented x 3, chronically ill/elderly in appearance HEENT: NCAT, PERRL, EOMI, sclera clear, MMM PULM: CTAB w/o wheezes/crackles, normal respiratory  effort, on room air CV: RRR w/o M/G/R GI: abd soft, NTND, NABS, no R/G/M MSK: no peripheral edema, moves all extremities independently  NEURO: No focal neurological deficits noted PSYCH: normal mood/affect Integumentary: dry/intact, no rashes or wounds    Data Reviewed: I have personally reviewed following labs and imaging studies  CBC: Recent Labs  Lab 01/09/23 0325 01/10/23 0308 01/11/23 0316 01/11/23 1738 01/12/23 0103 01/12/23 1408 01/13/23 0333 01/13/23 1233 01/14/23 0326 01/15/23 0306  WBC 6.9 7.6 6.5 7.7 6.5  --  4.9  --  5.0 4.3  NEUTROABS 4.6 5.2 3.8  --  4.0  --   --   --   --   --   HGB 8.7* 8.4* 8.1* 8.8* 7.9* 9.1* 7.5* 8.9* 7.5* 7.0*  HCT 27.1* 26.4* 25.5* 27.9* 24.7* 28.8* 24.0* 28.0* 24.0* 22.7*  MCV 96.4 97.8 95.9 97.2 95.4  --  98.0  --  97.2 97.8  PLT 206 187 178 203 178  --  183  --  187 178   Basic Metabolic Panel: Recent Labs  Lab 01/09/23 0325 01/09/23 0326 01/10/23 0308 01/11/23 0316 01/11/23 1738 01/12/23 0103 01/12/23 2113 01/13/23 0333 01/13/23 1233 01/14/23 0326 01/15/23 0306  NA 132*  --  129* 127* 126* 123* 125* 125* 127* 127* 131*  K 4.3  --  4.5 5.5* 5.7* 4.8  --  5.0  --  5.1 5.0  CL 94*  --  89* 90* 89* 88*  --  93*  --  98 103  CO2 31  --  33* 31 28 28   --  26  --  22 22  GLUCOSE 122*  --  136* 118* 133* 94  --  104*  --  104* 103*  BUN 36*  --  38* 45* 41* 40*  --  34*  --  30* 27*  CREATININE 1.13*  --  1.54* 1.33* 1.34* 1.32*  --  1.36*  --  1.23* 1.11*  CALCIUM 8.7*  --  8.3* 8.2* 8.4* 8.0*  --  7.9*  --  8.2* 8.4*  MG 2.8* 2.8* 3.2* 3.0*  --  2.8*  --   --   --   --   --    GFR: Estimated Creatinine Clearance: 39.3 mL/min (A) (by C-G formula based on SCr of 1.11 mg/dL (H)). Liver Function Tests: Recent Labs  Lab 01/09/23 0325 01/10/23 0308 01/11/23 0316 01/12/23 0103  AST 22 20 16  13*  ALT 26 26 21 18   ALKPHOS 68 72 72 65  BILITOT 0.7 0.7 0.6 0.6  PROT 5.9* 5.9* 5.5* 5.5*  ALBUMIN 3.0* 2.9* 2.8* 2.7*   No  results for input(s): "LIPASE", "AMYLASE" in the last 168 hours. No results for input(s): "AMMONIA" in the last 168 hours. Coagulation Profile: No results for input(s): "INR", "PROTIME" in the last 168 hours. Cardiac Enzymes: No results for input(s): "CKTOTAL", "CKMB", "CKMBINDEX", "TROPONINI" in the last 168 hours. BNP (last 3 results) No results for input(s): "PROBNP" in the last 8760 hours. HbA1C: No results for input(s): "HGBA1C" in the last 72 hours.  CBG: No results for input(s): "GLUCAP" in the last 168 hours. Lipid Profile: No results for input(s): "CHOL", "HDL", "LDLCALC", "TRIG", "CHOLHDL", "LDLDIRECT" in the last 72 hours. Thyroid Function Tests: No results for input(s): "  TSH", "T4TOTAL", "FREET4", "T3FREE", "THYROIDAB" in the last 72 hours. Anemia Panel: No results for input(s): "VITAMINB12", "FOLATE", "FERRITIN", "TIBC", "IRON", "RETICCTPCT" in the last 72 hours.  Sepsis Labs: No results for input(s): "PROCALCITON", "LATICACIDVEN" in the last 168 hours.  No results found for this or any previous visit (from the past 240 hour(s)).       Radiology Studies: No results found.      Scheduled Meds:  sodium chloride   Intravenous Once   acetaminophen  650 mg Oral BID   calcitonin (salmon)  1 spray Alternating Nares Daily   cloNIDine  0.2 mg Oral BID   docusate sodium  100 mg Oral BID   mometasone-formoterol  2 puff Inhalation BID   pantoprazole (PROTONIX) IV  40 mg Intravenous Q12H   polyethylene glycol  17 g Oral BID   sodium chloride  2 g Oral BID WC   Vitamin D (Ergocalciferol)  50,000 Units Oral Q7 days   Continuous Infusions:  sodium chloride 100 mL/hr at 01/14/23 2336     LOS: 2 days    Time spent: 48 minutes spent on chart review, discussion with nursing staff, consultants, updating family and interview/physical exam; more than 50% of that time was spent in counseling and/or coordination of care.    Alvira Philips [place], DO [facility] Available via Epic secure chat 7am-7pm After these hours, please refer to coverage provider listed on [email or web address] 01/15/2023, 11:26 AM

## 2023-01-15 NOTE — Progress Notes (Signed)
Dressing to left forearm changed, reported to this RN that patient had suffered a skin tear during transfer from the chair to the bed sometime on 01/12/23, large square mepilex had some old drainage on it and was sticking to the wound- used saline to help remove, scant bleeding noted, placed piece of vasoline gauze to help prevent mepilex dressing from sticking- dressed with smaller mepilex dressing that covered the area.

## 2023-01-15 NOTE — Interval H&P Note (Signed)
History and Physical Interval Note:  01/15/2023 7:45 AM  [name]  has presented today for surgery, with the diagnosis of Upper tract symptoms anemia.  The various methods of treatment have been discussed with the patient and family. After consideration of risks, benefits and other options for treatment, the patient has consented to  Procedure(s): ESOPHAGOGASTRODUODENOSCOPY (EGD) WITH PROPOFOL (N/A) as a surgical intervention.  The patient's history has been reviewed, patient examined, no change in status, stable for surgery.  I have reviewed the patient's chart and labs.  Questions were answered to the patient's satisfaction.     Lynann Bologna

## 2023-01-15 NOTE — Plan of Care (Signed)
  Problem: Coping: Goal: Level of anxiety will decrease Outcome: Progressing   Problem: Pain Managment: Goal: General experience of comfort will improve Outcome: Progressing   Problem: Safety: Goal: Ability to remain free from injury will improve Outcome: Progressing   

## 2023-01-16 ENCOUNTER — Encounter (HOSPITAL_COMMUNITY): Payer: Self-pay | Admitting: Internal Medicine

## 2023-01-16 ENCOUNTER — Inpatient Hospital Stay (HOSPITAL_COMMUNITY): Payer: Medicare Other | Admitting: Certified Registered Nurse Anesthetist

## 2023-01-16 ENCOUNTER — Encounter (HOSPITAL_COMMUNITY)
Admission: EM | Disposition: A | Discharge status: Skilled Nursing Facility | Payer: Self-pay | Source: Home / Self Care | Attending: Internal Medicine

## 2023-01-16 DIAGNOSIS — I2510 Atherosclerotic heart disease of native coronary artery without angina pectoris: Secondary | ICD-10-CM

## 2023-01-16 DIAGNOSIS — K31819 Angiodysplasia of stomach and duodenum without bleeding: Secondary | ICD-10-CM

## 2023-01-16 DIAGNOSIS — K2970 Gastritis, unspecified, without bleeding: Secondary | ICD-10-CM

## 2023-01-16 DIAGNOSIS — K224 Dyskinesia of esophagus: Secondary | ICD-10-CM

## 2023-01-16 DIAGNOSIS — S32050A Wedge compression fracture of fifth lumbar vertebra, initial encounter for closed fracture: Secondary | ICD-10-CM | POA: Diagnosis not present

## 2023-01-16 DIAGNOSIS — I110 Hypertensive heart disease with heart failure: Secondary | ICD-10-CM

## 2023-01-16 DIAGNOSIS — I509 Heart failure, unspecified: Secondary | ICD-10-CM

## 2023-01-16 DIAGNOSIS — K5520 Angiodysplasia of colon without hemorrhage: Secondary | ICD-10-CM

## 2023-01-16 HISTORY — PX: ENTEROSCOPY: SHX5533

## 2023-01-16 LAB — CBC
HCT: 26.5 % — ABNORMAL LOW (ref 36.0–46.0)
Hemoglobin: 8.3 g/dL — ABNORMAL LOW (ref 12.0–15.0)
MCH: 30.9 pg (ref 26.0–34.0)
MCHC: 31.3 g/dL (ref 30.0–36.0)
MCV: 98.5 fL (ref 80.0–100.0)
Platelets: 191 10*3/uL (ref 150–400)
RBC: 2.69 MIL/uL — ABNORMAL LOW (ref 3.87–5.11)
RDW: 14.6 % (ref 11.5–15.5)
WBC: 4.2 10*3/uL (ref 4.0–10.5)
nRBC: 0 % (ref 0.0–0.2)

## 2023-01-16 LAB — BPAM RBC
Blood Product Expiration Date: 202406092359
Blood Product Expiration Date: 202406092359
ISSUE DATE / TIME: 202405111306
ISSUE DATE / TIME: 202405111306
Unit Type and Rh: 5100

## 2023-01-16 LAB — TYPE AND SCREEN
ABO/RH(D): O POS
ABO/RH(D): O POS
Antibody Screen: NEGATIVE

## 2023-01-16 LAB — BASIC METABOLIC PANEL
Anion gap: 8 (ref 5–15)
BUN: 25 mg/dL — ABNORMAL HIGH (ref 8–23)
CO2: 19 mmol/L — ABNORMAL LOW (ref 22–32)
Calcium: 8.6 mg/dL — ABNORMAL LOW (ref 8.9–10.3)
Chloride: 105 mmol/L (ref 98–111)
Creatinine, Ser: 1.02 mg/dL — ABNORMAL HIGH (ref 0.44–1.00)
GFR, Estimated: 54 mL/min — ABNORMAL LOW (ref >60)
Glucose, Bld: 110 mg/dL — ABNORMAL HIGH (ref 70–99)
Potassium: 5.3 mmol/L — ABNORMAL HIGH (ref 3.5–5.1)
Sodium: 132 mmol/L — ABNORMAL LOW (ref 135–145)

## 2023-01-16 SURGERY — ENTEROSCOPY
Anesthesia: Monitor Anesthesia Care

## 2023-01-16 MED ORDER — APIXABAN 5 MG PO TABS
5.0000 mg | ORAL_TABLET | Freq: Two times a day (BID) | ORAL | Status: DC
  Administered 2023-01-16 – 2023-01-17 (×4): 5 mg via ORAL
  Filled 2023-01-16 (×4): qty 1

## 2023-01-16 MED ORDER — LIDOCAINE 2% (20 MG/ML) 5 ML SYRINGE
INTRAMUSCULAR | Status: DC | PRN
  Administered 2023-01-16 (×2): 20 mg via INTRAVENOUS
  Administered 2023-01-16: 60 mg via INTRAVENOUS

## 2023-01-16 MED ORDER — PANTOPRAZOLE SODIUM 40 MG PO TBEC
40.0000 mg | DELAYED_RELEASE_TABLET | Freq: Every day | ORAL | Status: DC
  Administered 2023-01-16 – 2023-01-17 (×2): 40 mg via ORAL
  Filled 2023-01-16 (×2): qty 1

## 2023-01-16 MED ORDER — SODIUM ZIRCONIUM CYCLOSILICATE 10 G PO PACK
10.0000 g | PACK | Freq: Once | ORAL | Status: AC
  Administered 2023-01-16: 10 g via ORAL
  Filled 2023-01-16: qty 1

## 2023-01-16 MED ORDER — PROPOFOL 500 MG/50ML IV EMUL
INTRAVENOUS | Status: DC | PRN
  Administered 2023-01-16: 125 ug/kg/min via INTRAVENOUS

## 2023-01-16 MED ORDER — PROPOFOL 1000 MG/100ML IV EMUL
INTRAVENOUS | Status: AC
  Filled 2023-01-16: qty 100

## 2023-01-16 MED ORDER — LACTATED RINGERS IV SOLN: INTRAVENOUS | Status: DC

## 2023-01-16 MED ORDER — PROPOFOL 10 MG/ML IV BOLUS
INTRAVENOUS | Status: DC | PRN
  Administered 2023-01-16 (×2): 20 mg via INTRAVENOUS

## 2023-01-16 MED ORDER — PROPOFOL 10 MG/ML IV BOLUS
INTRAVENOUS | Status: AC
  Filled 2023-01-16: qty 20

## 2023-01-16 NOTE — Anesthesia Postprocedure Evaluation (Signed)
Anesthesia Post Note  Patient: [name]  Procedure(s) Performed: ENTEROSCOPY     Patient location during evaluation: PACU Anesthesia Type: MAC Level of consciousness: awake and alert Pain management: pain level controlled Vital Signs Assessment: post-procedure vital signs reviewed and stable Respiratory status: spontaneous breathing, nonlabored ventilation and respiratory function stable Cardiovascular status: blood pressure returned to baseline Postop Assessment: no apparent nausea or vomiting Anesthetic complications: no   No notable events documented.  Last Vitals:  Vitals:   01/16/23 0945 01/16/23 1008  BP: (!) 164/44 (!) 169/50  Pulse: (!) 58 (!) 58  Resp: (!) 21   Temp:  36.8 C  SpO2: 94% 93%              Shanda Howells

## 2023-01-16 NOTE — Op Note (Signed)
[facility] Patient Name: [name] Procedure Date: 01/16/2023 MRN: [id number] Attending MD: Liliane Shi DO, DO, [id number] Date of Birth: 12-23-1934 CSN: [id number] Age: 88 Admit Type: Inpatient Procedure:                Small bowel enteroscopy Indications:              Iron deficiency anemia, Melena, Angiodysplasia (of                            intestine) Providers:                Liliane Shi DO, DO, Carlena Hurl RN, RN,                            Kandice Robinsons, Technician Referring MD:              Medicines:                See the Anesthesia note for documentation of the                            administered medications Complications:            No immediate complications. Estimated Blood Loss:     Estimated blood loss: none. Procedure:                Pre-Anesthesia Assessment:                           - ASA Grade Assessment: III - A patient with severe                            systemic disease.                           - The risks and benefits of the procedure and the                            sedation options and risks were discussed with the                            patient. All questions were answered and informed                            consent was obtained.                           After obtaining informed consent, the endoscope was                            passed under direct vision. Throughout the                            procedure, the patient's blood pressure, pulse, and                            oxygen saturations were monitored continuously. The  [id number]-HQ190L ([id number]) Olympus colonoscope was                            introduced through the mouth and advanced to the                            mid-jejunum. The small bowel enteroscopy was                            accomplished without difficulty. The patient                            tolerated the procedure well. Scope In: Scope  Out: Findings:      LA Grade B (one or more mucosal breaks greater than 5 mm, not extending       between the tops of two mucosal folds) esophagitis with no bleeding was       found 38 cm from the incisors.      Abnormal motility was noted at the lower esophageal sphincter. The       distal esophagus/lower esophageal sphincter is open. Biopsies of distal       and proximal esophagus were taken during EGD on 01/15/23 and pending.      Segmental mild inflammation characterized by congestion (edema) and       erythema was found in the gastric antrum. Biopsies of stomach were taken       to rule out H.Pylori during EGD on 01/15/23 and pending.      Location of previously seen, single angiodysplastic lesion with no       bleeding was found in the first portion of the duodenum. This had been       treated with argon plasma coagulation during EGD 01/15/23.      There was no evidence of significant pathology in the proximal jejunum,       in the mid-jejunum and at 160 cm (from the incisors). Impression:               - LA Grade B esophagitis with no bleeding.                           - Abnormal esophageal motility.                           - Gastritis.                           - Location of previously seen single non-bleeding                            angiodysplastic lesion in the duodenum was noted.                           - The examined portion of the jejunum was normal.                           - No specimens collected. Recommendation:           - Recommend outpatient esophageal manometry study,  follow up esophageal biopsy results from EGD                            01/15/23.                           - Oral PPI therapy once daily, follow up pathology                            from gastric biopsies from EGD 01/15/23.                           - Return patient to hospital ward for ongoing care.                           - Check hemoglobin daily.                            - Resume regular diet.                           - Monitor bowel movements for further melena.                           - Ok to resume anticoagulation from GI perspective.                           - If further melena were to occur, would consider                            video capsule endoscopy versus colonoscopy. Procedure Code(s):        --- Professional ---                           [phone], Small intestinal endoscopy, enteroscopy                            beyond second portion of duodenum, not including                            ileum; diagnostic, including collection of                            specimen(s) by brushing or washing, when performed                            (separate procedure) Diagnosis Code(s):        --- Professional ---                           K22.4, Dyskinesia of esophagus                           K29.70, Gastritis, unspecified, without bleeding  K31.819, Angiodysplasia of stomach and duodenum                            without bleeding                           D50.9, Iron deficiency anemia, unspecified                           K55.20, Angiodysplasia of colon without hemorrhage                           K92.1, Melena (includes Hematochezia) CPT copyright 2022 American [employer] Association. All rights reserved. The codes documented in this report are preliminary and upon coder review [place]  be revised to meet current compliance requirements. Dr Liliane Shi, DO Liliane Shi DO, DO 01/16/2023 9:41:11 AM Number of Addenda: 0

## 2023-01-16 NOTE — Interval H&P Note (Signed)
History and Physical Interval Note:  01/16/2023 9:03 AM  [name]  has presented today for surgery, with the diagnosis of Anemia, history AVM.  The various methods of treatment have been discussed with the patient. After consideration of risks, benefits and other options for treatment, the patient has consented to  Procedure(s): ENTEROSCOPY (N/A) as a surgical intervention.  The patient's history has been reviewed, patient examined, no change in status, stable for surgery.  I have reviewed the patient's chart and labs.  Questions were answered to the patient's satisfaction.     Lynann Bologna

## 2023-01-16 NOTE — Progress Notes (Signed)
PROGRESS NOTE    [name]  WGN:[id number] DOB: [birth year] DOA: 01/08/2023 PCP: Donita Brooks, MD    Brief Narrative:   [name] is a 88 y.o. female with past medical history significant for coronary artery disease (cath 12/2020 with nonobstructive CAD), L3 and L4 kyphoplasty (11/2022), mild/moderate aortic stenosis (AVA 1.42cm2 via Echo 03/2022), chronic hyponatremia, permanent atrial fibrillation on Eliquis, diastolic congestive heart failure with Takotsubo cardiomyopathy in the past, hypertension, anemia of chronic disease presenting to [facility] emergency department on 5/4 with complaints of fall after tripping and low back pain.   Upon evaluation in the emergency department trauma evaluation included a CTA scan of the right femur revealing an acute nondisplaced fracture across the fibular head and neck junction along with CT imaging of the pelvis identifying an L5 superior endplate central compression fracture with slight posterior superior retropulsion and fragmentation. EDP discussed case with Dr. Shon Baton with orthopedic surgery who agreed to consult.  The hospitalist group was then called to assess the patient for admission to the hospital.   Dr. Shon Baton recommended TLSO bracing and conservative measures for management of the compression fracture.  They additionally felt that the right fibular head fracture likely also did not require surgical intervention.     Hospital course was complicated by development of hyperkalemia on hospital day 3 requiring gentle hydration, multiple doses of Lokelma as well as dextrose and insulin.  Hospital course was also complicated by development of progressive anemia, prompting intravenous Protonix and GI consultation.  Assessment & Plan:   Closed compression fracture of L5 lumbar vertebra, initial encounter  Patient presenting to the ED with acute low back pain following mechanical fall.  MR L-spine without contrast with no acute L5 superior  endplate compression fracture with 20% height loss without retropulsion, L4 compression fracture noted s/p interval cement augmentation with persistent diffuse marrow edema but no progressive height loss, nearly healed L3 compression fracture and unchanged chronic L1/L2 compression deformities; noted worsened moderate spinal canal stenosis.  Orthopedics, Dr. Shon Baton was consulted and recommended TLSO bracing and conservative measures for pain management. -- WBAT, w/ TLSO brain -- Tylenol 650 g p.o. twice daily; q6h PRN mild pain -- Robaxin 5 mg p.o. every 6 hours as needed muscle spasms -- Continue vitamin D supplementation -- Continue therapy efforts while inpatient, pending SNF placement -- Outpatient f/u Dr. Shelle Iron in 2 weeks with repeat Xrays  Closed right fibular fracture Evaluated by orthopedics, Dr. Shon Baton.  Recommend conservative management as this is not weightbearing. -- WBAT with knee immobilizer   Vitamin D deficiency Low vitamin D of 26.24 on this hospitalization despite outpatient supplementation -- started ergocalciferol 50,000 units q. weekly.   Acute blood loss anemia 2/2 UGIB/AVM Patient reports ongoing dark stools, although on oral iron therapy.  Elevated BUN.  Complicated by use of anticoagulation with Eliquis for underlying atrial fibrillation.  FOBT was positive.  GI was consulted and patient underwent EGD on 01/15/2023 with finding of an AVM First portion of duodenum s/p argon plasma coagulation.  Underwent small bowel enteroscopy on 01/16/2023 with no significant findings.  Charlesetta Shanks fused 1 unit PRBC on 5/11. -- [facility] following, appreciate assistance -- Hgb 9.4>>8.1>8.8>7.9>9.1>7.5>8.9>7.5>7.0>8.3 --Esophageal/gastric biopsy: Pending -- Protonix 40mg  p.o. daily -- Repeat CBC in a.m. -- Outpatient esophageal manometry study per GI -- If melena were to reoccur, would need consideration of video capsule endoscopy versus colonoscopy  Hyperkalemia: Resolved Patient with  recurrent hyperkalemia of unknown etiology. Developed hyperkalemia several weeks  ago and at that time cardiology had discontinued her spironolactone. Hyperkalemia in the setting of rising BUN, dropping hemoglobin and hematocrit and reports of black stool being about concern for upper gastrointestinal bleeding.  Received Lokelma, D50, insulin and gentle intravenous fluids with now resolution. -- K 4.3>>5.5>5.7>4.8>5.0>5.3 -- Lokelma 10 g p.o. x 1 today -- Repeat BMP in a.m.   Acute on chronic hyponatremia Reportedly "chronic" hyponatremia however baseline sodium after chart review reveals baseline sodium in the low 130s. Sodium is currently lower than baseline, with concurrent hypochloremia suggesting volume depletion; complicated by home use of torsemide 40 mg p.o. twice daily.  Urine sodium 22, urine osmolality 267. -- Na 131>132>129>>127>126>123>125>127>131>132 -- Holding home torsemide -- NS at 100 mL/h -- Sodium chloride 2 g PO BID -- BMP daily   Left ankle pain Ongoing severe left ankle pain that preceded the patient's fall seems to have been increasing as of late Plain film imaging of the ankle performed on admission revealed no evidence of fracture CT imaging additionally reveals no evidence of fracture.  Uric acid level elevated at 12.0.  No physical exam evidence of gout but considering negative imaging and elevated uric acid level received one-time dose of colchicine. -- Will hold off further colchicine dosing due to concern for upper GI bleed as above; if pain to left ankle persists, will consider prednisone initiation  CKD stage IIIb Baseline creatinine 1.2-1.3 over past year. -- Cr 1.12>>1.54>1.33>1.34>1.32>1.36>1.23>1.11>1.02 -- BMP daily   Persistent atrial fibrillation  -- Okay to restart Eliquis per GI today   Chronic diastolic CHF (congestive heart failure); compensated No clinical evidence of cardiogenic volume overload -- Strict input and output monitoring -- Daily  weights    Essential hypertension -- Clonidine 0.2 mg p.o. twice daily -- Hydralazine 25 mg PO q6h PRN SBP >165  Anxiety -- Clonazepam 0.5 mg p.o. twice daily as needed      DVT prophylaxis: Place and maintain sequential compression device Start: 01/11/23 1334 SCDs Start: 01/08/23 2355 apixaban (ELIQUIS) tablet 5 mg    Code Status: Full Code Family Communication:   Disposition Plan:  Level of care: Med-Surg Status is: Inpatient Remains inpatient appropriate because: IV fluids, anticipate discharge to SNF tomorrow if hemoglobin remained stable    Consultants:  [facility]  Procedures:  EGD 5/11 Small bowel enteroscopy 5/12  Antimicrobials:  None   Subjective: Patient seen examined bedside, resting comfortably.  Lying in bed.  About to go down to the GI lab for small bowel enteroscopy this morning.  Hemoglobin up to 8.3 after receiving 1 unit PRBC yesterday.  Continues with mild back discomfort, continues to slowly improve.  No other specific complaints or concerns at this time.  Denies headache, no dizziness, no chest pain, no palpitations, no shortness of breath, no abdominal pain, no fever/chills/night sweats, no nausea/vomiting/diarrhea.  No acute events overnight per nursing staff.  Objective: Vitals:   01/16/23 0936 01/16/23 0938 01/16/23 0945 01/16/23 1008  BP: (!) 138/44  (!) 164/44 (!) 169/50  Pulse: (!) 147 (!) 59 (!) 58 (!) 58  Resp:  (!) 21 (!) 21   Temp: 97.8 F (36.6 C)   98.2 F (36.8 C)  TempSrc:    Oral  SpO2: 99% 100% 94% 93%  Weight:      Height:        Intake/Output Summary (Last 24 hours) at 01/16/2023 1145 Last data filed at 01/16/2023 0926 Gross per 24 hour  Intake 2391.87 ml  Output 500 ml  Net  1891.87 ml   Filed Weights   01/12/23 0500 01/14/23 0500 01/15/23 0422  Weight: 75.2 kg 82.1 kg 85.6 kg    Examination:  Physical Exam: GEN: NAD, alert and oriented x 3, chronically ill/elderly in appearance HEENT: NCAT, PERRL,  EOMI, sclera clear, MMM PULM: CTAB w/o wheezes/crackles, normal respiratory effort, on room air CV: RRR w/o M/G/R GI: abd soft, NTND, NABS, no R/G/M MSK: no peripheral edema, moves all extremities independently  NEURO: No focal neurological deficits noted PSYCH: normal mood/affect Integumentary: dry/intact, no rashes or wounds    Data Reviewed: I have personally reviewed following labs and imaging studies  CBC: Recent Labs  Lab 01/10/23 0308 01/11/23 0316 01/11/23 1738 01/12/23 0103 01/12/23 1408 01/13/23 0333 01/13/23 1233 01/14/23 0326 01/15/23 0306 01/16/23 0323  WBC 7.6 6.5   < > 6.5  --  4.9  --  5.0 4.3 4.2  NEUTROABS 5.2 3.8  --  4.0  --   --   --   --   --   --   HGB 8.4* 8.1*   < > 7.9*   < > 7.5* 8.9* 7.5* 7.0* 8.3*  HCT 26.4* 25.5*   < > 24.7*   < > 24.0* 28.0* 24.0* 22.7* 26.5*  MCV 97.8 95.9   < > 95.4  --  98.0  --  97.2 97.8 98.5  PLT 187 178   < > 178  --  183  --  187 178 191   < > = values in this interval not displayed.   Basic Metabolic Panel: Recent Labs  Lab 01/10/23 0308 01/11/23 0316 01/11/23 1738 01/12/23 0103 01/12/23 2113 01/13/23 0333 01/13/23 1233 01/14/23 0326 01/15/23 0306 01/16/23 0323  NA 129* 127*   < > 123*   < > 125* 127* 127* 131* 132*  K 4.5 5.5*   < > 4.8  --  5.0  --  5.1 5.0 5.3*  CL 89* 90*   < > 88*  --  93*  --  98 103 105  CO2 33* 31   < > 28  --  26  --  22 22 19*  GLUCOSE 136* 118*   < > 94  --  104*  --  104* 103* 110*  BUN 38* 45*   < > 40*  --  34*  --  30* 27* 25*  CREATININE 1.54* 1.33*   < > 1.32*  --  1.36*  --  1.23* 1.11* 1.02*  CALCIUM 8.3* 8.2*   < > 8.0*  --  7.9*  --  8.2* 8.4* 8.6*  MG 3.2* 3.0*  --  2.8*  --   --   --   --   --   --    < > = values in this interval not displayed.   GFR: Estimated Creatinine Clearance: 42.8 mL/min (A) (by C-G formula based on SCr of 1.02 mg/dL (H)). Liver Function Tests: Recent Labs  Lab 01/10/23 0308 01/11/23 0316 01/12/23 0103  AST 20 16 13*  ALT 26 21 18    ALKPHOS 72 72 65  BILITOT 0.7 0.6 0.6  PROT 5.9* 5.5* 5.5*  ALBUMIN 2.9* 2.8* 2.7*   No results for input(s): "LIPASE", "AMYLASE" in the last 168 hours. No results for input(s): "AMMONIA" in the last 168 hours. Coagulation Profile: No results for input(s): "INR", "PROTIME" in the last 168 hours. Cardiac Enzymes: No results for input(s): "CKTOTAL", "CKMB", "CKMBINDEX", "TROPONINI" in the last 168 hours. BNP (last 3 results) No results  for input(s): "PROBNP" in the last 8760 hours. HbA1C: No results for input(s): "HGBA1C" in the last 72 hours.  CBG: No results for input(s): "GLUCAP" in the last 168 hours. Lipid Profile: No results for input(s): "CHOL", "HDL", "LDLCALC", "TRIG", "CHOLHDL", "LDLDIRECT" in the last 72 hours. Thyroid Function Tests: No results for input(s): "TSH", "T4TOTAL", "FREET4", "T3FREE", "THYROIDAB" in the last 72 hours. Anemia Panel: No results for input(s): "VITAMINB12", "FOLATE", "FERRITIN", "TIBC", "IRON", "RETICCTPCT" in the last 72 hours.  Sepsis Labs: No results for input(s): "PROCALCITON", "LATICACIDVEN" in the last 168 hours.  No results found for this or any previous visit (from the past 240 hour(s)).       Radiology Studies: No results found.      Scheduled Meds:  sodium chloride   Intravenous Once   acetaminophen  650 mg Oral BID   apixaban  5 mg Oral BID   calcitonin (salmon)  1 spray Alternating Nares Daily   cloNIDine  0.2 mg Oral BID   docusate sodium  100 mg Oral BID   mometasone-formoterol  2 puff Inhalation BID   pantoprazole  40 mg Oral Daily   polyethylene glycol  17 g Oral BID   sodium chloride  2 g Oral BID WC   Vitamin D (Ergocalciferol)  50,000 Units Oral Q7 days   Continuous Infusions:  sodium chloride 100 mL/hr at 01/16/23 1011     LOS: 3 days    Time spent: 48 minutes spent on chart review, discussion with nursing staff, consultants, updating family and interview/physical exam; more than 50% of that time was  spent in counseling and/or coordination of care.    Alvira Philips [place], DO [facility] Available via Epic secure chat 7am-7pm After these hours, please refer to coverage provider listed on [email or web address] 01/16/2023, 11:45 AM

## 2023-01-16 NOTE — Transfer of Care (Signed)
Immediate Anesthesia Transfer of Care Note  Patient: [name]  Procedure(s) Performed: ENTEROSCOPY  Patient Location: PACU  Anesthesia Type:MAC  Level of Consciousness: drowsy and patient cooperative  Airway & Oxygen Therapy: Patient Spontanous Breathing and Patient connected to face mask oxygen  Post-op Assessment: Report given to RN and Post -op Vital signs reviewed and stable  Post vital signs: Reviewed and stable  Last Vitals:  Vitals Value Taken Time  BP 138/44 01/16/23 0936  Temp 36.6 C 01/16/23 0936  Pulse 61 01/16/23 0940  Resp 17 01/16/23 0940  SpO2 93 % 01/16/23 0940  Vitals shown include unvalidated device data.  Last Pain:  Vitals:   01/16/23 0844  TempSrc: Temporal  PainSc:       Patients Stated Pain Goal: 3 (01/13/23 0350)  Complications: No notable events documented.

## 2023-01-17 DIAGNOSIS — Z7901 Long term (current) use of anticoagulants: Secondary | ICD-10-CM | POA: Diagnosis not present

## 2023-01-17 DIAGNOSIS — S32030D Wedge compression fracture of third lumbar vertebra, subsequent encounter for fracture with routine healing: Secondary | ICD-10-CM | POA: Diagnosis not present

## 2023-01-17 DIAGNOSIS — K219 Gastro-esophageal reflux disease without esophagitis: Secondary | ICD-10-CM | POA: Diagnosis not present

## 2023-01-17 DIAGNOSIS — I4819 Other persistent atrial fibrillation: Secondary | ICD-10-CM | POA: Diagnosis not present

## 2023-01-17 DIAGNOSIS — Z7401 Bed confinement status: Secondary | ICD-10-CM | POA: Diagnosis not present

## 2023-01-17 DIAGNOSIS — D631 Anemia in chronic kidney disease: Secondary | ICD-10-CM | POA: Diagnosis not present

## 2023-01-17 DIAGNOSIS — J302 Other seasonal allergic rhinitis: Secondary | ICD-10-CM | POA: Diagnosis not present

## 2023-01-17 DIAGNOSIS — I130 Hypertensive heart and chronic kidney disease with heart failure and stage 1 through stage 4 chronic kidney disease, or unspecified chronic kidney disease: Secondary | ICD-10-CM | POA: Diagnosis not present

## 2023-01-17 DIAGNOSIS — G8929 Other chronic pain: Secondary | ICD-10-CM | POA: Diagnosis not present

## 2023-01-17 DIAGNOSIS — D509 Iron deficiency anemia, unspecified: Secondary | ICD-10-CM | POA: Diagnosis not present

## 2023-01-17 DIAGNOSIS — L03116 Cellulitis of left lower limb: Secondary | ICD-10-CM | POA: Diagnosis not present

## 2023-01-17 DIAGNOSIS — R531 Weakness: Secondary | ICD-10-CM | POA: Diagnosis not present

## 2023-01-17 DIAGNOSIS — I5032 Chronic diastolic (congestive) heart failure: Secondary | ICD-10-CM | POA: Diagnosis not present

## 2023-01-17 DIAGNOSIS — K5790 Diverticulosis of intestine, part unspecified, without perforation or abscess without bleeding: Secondary | ICD-10-CM | POA: Diagnosis not present

## 2023-01-17 DIAGNOSIS — M25561 Pain in right knee: Secondary | ICD-10-CM | POA: Diagnosis not present

## 2023-01-17 DIAGNOSIS — M8580 Other specified disorders of bone density and structure, unspecified site: Secondary | ICD-10-CM | POA: Diagnosis not present

## 2023-01-17 DIAGNOSIS — I10 Essential (primary) hypertension: Secondary | ICD-10-CM | POA: Diagnosis not present

## 2023-01-17 DIAGNOSIS — S32050A Wedge compression fracture of fifth lumbar vertebra, initial encounter for closed fracture: Secondary | ICD-10-CM | POA: Diagnosis not present

## 2023-01-17 DIAGNOSIS — S32050D Wedge compression fracture of fifth lumbar vertebra, subsequent encounter for fracture with routine healing: Secondary | ICD-10-CM | POA: Diagnosis not present

## 2023-01-17 DIAGNOSIS — J449 Chronic obstructive pulmonary disease, unspecified: Secondary | ICD-10-CM | POA: Diagnosis not present

## 2023-01-17 DIAGNOSIS — D649 Anemia, unspecified: Secondary | ICD-10-CM | POA: Diagnosis not present

## 2023-01-17 DIAGNOSIS — M5451 Vertebrogenic low back pain: Secondary | ICD-10-CM | POA: Diagnosis not present

## 2023-01-17 DIAGNOSIS — I2510 Atherosclerotic heart disease of native coronary artery without angina pectoris: Secondary | ICD-10-CM | POA: Diagnosis not present

## 2023-01-17 DIAGNOSIS — S82401D Unspecified fracture of shaft of right fibula, subsequent encounter for closed fracture with routine healing: Secondary | ICD-10-CM | POA: Diagnosis not present

## 2023-01-17 DIAGNOSIS — F411 Generalized anxiety disorder: Secondary | ICD-10-CM | POA: Diagnosis not present

## 2023-01-17 DIAGNOSIS — D62 Acute posthemorrhagic anemia: Secondary | ICD-10-CM | POA: Diagnosis not present

## 2023-01-17 DIAGNOSIS — W19XXXD Unspecified fall, subsequent encounter: Secondary | ICD-10-CM | POA: Diagnosis not present

## 2023-01-17 DIAGNOSIS — N1832 Chronic kidney disease, stage 3b: Secondary | ICD-10-CM | POA: Diagnosis not present

## 2023-01-17 DIAGNOSIS — E871 Hypo-osmolality and hyponatremia: Secondary | ICD-10-CM | POA: Diagnosis not present

## 2023-01-17 LAB — BASIC METABOLIC PANEL
Anion gap: 6 (ref 5–15)
BUN: 24 mg/dL — ABNORMAL HIGH (ref 8–23)
CO2: 18 mmol/L — ABNORMAL LOW (ref 22–32)
Calcium: 8.5 mg/dL — ABNORMAL LOW (ref 8.9–10.3)
Chloride: 108 mmol/L (ref 98–111)
Creatinine, Ser: 0.94 mg/dL (ref 0.44–1.00)
GFR, Estimated: 59 mL/min — ABNORMAL LOW (ref >60)
Glucose, Bld: 109 mg/dL — ABNORMAL HIGH (ref 70–99)
Potassium: 5.2 mmol/L — ABNORMAL HIGH (ref 3.5–5.1)
Sodium: 132 mmol/L — ABNORMAL LOW (ref 135–145)

## 2023-01-17 LAB — CBC
HCT: 27.7 % — ABNORMAL LOW (ref 36.0–46.0)
Hemoglobin: 8.6 g/dL — ABNORMAL LOW (ref 12.0–15.0)
MCH: 31.5 pg (ref 26.0–34.0)
MCHC: 31 g/dL (ref 30.0–36.0)
MCV: 101.5 fL — ABNORMAL HIGH (ref 80.0–100.0)
Platelets: 195 10*3/uL (ref 150–400)
RBC: 2.73 MIL/uL — ABNORMAL LOW (ref 3.87–5.11)
RDW: 14.9 % (ref 11.5–15.5)
WBC: 4.5 10*3/uL (ref 4.0–10.5)
nRBC: 0 % (ref 0.0–0.2)

## 2023-01-17 MED ORDER — SODIUM ZIRCONIUM CYCLOSILICATE 10 G PO PACK
10.0000 g | PACK | Freq: Once | ORAL | Status: AC
  Administered 2023-01-17: 10 g via ORAL
  Filled 2023-01-17: qty 1

## 2023-01-17 MED ORDER — ACETAMINOPHEN 325 MG PO TABS: 650.0000 mg | ORAL_TABLET | Freq: Four times a day (QID) | ORAL | Status: DC | PRN

## 2023-01-17 MED ORDER — TORSEMIDE 20 MG PO TABS: 20.0000 mg | ORAL_TABLET | Freq: Every day | ORAL | 0 refills | Status: DC

## 2023-01-17 MED ORDER — SODIUM CHLORIDE 1 G PO TABS: 2.0000 g | ORAL_TABLET | Freq: Two times a day (BID) | ORAL | 0 refills | Status: AC

## 2023-01-17 MED ORDER — VITAMIN D (ERGOCALCIFEROL) 1.25 MG (50000 UNIT) PO CAPS: 50000.0000 [IU] | ORAL_CAPSULE | ORAL | 0 refills | Status: DC

## 2023-01-17 MED ORDER — OXYCODONE-ACETAMINOPHEN 5-325 MG PO TABS: 1.0000 | ORAL_TABLET | Freq: Four times a day (QID) | ORAL | 0 refills | Status: DC | PRN

## 2023-01-17 MED ORDER — TORSEMIDE 20 MG PO TABS
20.0000 mg | ORAL_TABLET | Freq: Every day | ORAL | Status: DC
  Administered 2023-01-17: 20 mg via ORAL
  Filled 2023-01-17: qty 1

## 2023-01-17 MED ORDER — PHENOL 1.4 % MT LIQD
1.0000 | OROMUCOSAL | Status: DC | PRN
  Administered 2023-01-17: 1 via OROMUCOSAL
  Filled 2023-01-17: qty 177

## 2023-01-17 MED ORDER — PANTOPRAZOLE SODIUM 40 MG PO TBEC: 40.0000 mg | DELAYED_RELEASE_TABLET | Freq: Every day | ORAL | 0 refills | Status: DC

## 2023-01-17 MED ORDER — CLONAZEPAM 0.5 MG PO TABS
0.5000 mg | ORAL_TABLET | Freq: Two times a day (BID) | ORAL | 0 refills | Status: DC | PRN
Start: 2023-01-17 — End: 2023-02-16

## 2023-01-17 NOTE — TOC Transition Note (Signed)
Transition of Care [facility]) - CM/SW Discharge Note  Patient Details  Name: [name] MRN: [id number] Date of Birth: 1934/10/11  Transition of Care [facility]) CM/SW Contact:  Ewing Schlein, LCSW Phone Number: 01/17/2023, 1:29 PM  Clinical Narrative: Patient is medically stable to discharge to [facility] today. CSW completed insurance authorization on [employer] portal. Reference ID # is: [id number]. Patient has been approved for 01/17/2023-01/19/2023. Discharge summary, discharge orders, and SNF transfer report faxed to facility in hub.  Medical necessity form done; PTAR scheduled. Discharge packet completed. Patient and son updated regarding discharge and transportation. RN updated. TOC signing off.  Final next level of care: Skilled Nursing Facility Barriers to Discharge: Barriers Resolved  Patient Goals and CMS Choice CMS Medicare.gov Compare Post Acute Care list provided to:: Patient Choice offered to / list presented to : Patient  Discharge Placement PASRR number recieved: 01/10/23      Patient chooses bed at: [facility] Patient to be transferred to facility by: PTAR Name of family member notified: [name] (son) Patient and family notified of of transfer: 01/17/23  Discharge Plan and Services Additional resources added to the After Visit Summary for   In-house Referral: Clinical Social Work       DME Arranged: N/A DME Agency: NA  Social Determinants of Health (SDOH) Interventions SDOH Screenings   Food Insecurity: No Food Insecurity (01/09/2023)  Housing: Low Risk  (01/09/2023)  Transportation Needs: No Transportation Needs (01/09/2023)  Utilities: Not At Risk (01/09/2023)  Alcohol Screen: Low Risk  (11/08/2022)  Depression (PHQ2-9): Low Risk  (12/16/2022)  Financial Resource Strain: Low Risk  (11/08/2022)  Recent Concern: Financial Resource Strain - Medium Risk (11/04/2022)  Physical Activity: Sufficiently Active (11/08/2022)  Recent Concern: Physical Activity -  Insufficiently Active (08/11/2022)  Social Connections: Moderately Integrated (11/08/2022)  Recent Concern: Social Connections - Moderately Isolated (08/11/2022)  Stress: No Stress Concern Present (11/08/2022)  Tobacco Use: Low Risk  (01/16/2023)   Readmission Risk Interventions     No data to display

## 2023-01-17 NOTE — Progress Notes (Signed)
Occupational Therapy Treatment Patient Details Name: [name] MRN: [id number] DOB: [birth year] Today's Date: 01/17/2023   History of present illness Pt admitted from home s/p fall with L-5 compression fx and R fibular head fx (both nonoperative).  Pt with hx of HOH, CHF, colon CA, breast CA, CAD, COPD, CVA, afib, recent L3 and L4 compression fx with balloon kyphoplasty   OT comments  Patient was noted to have had a change in ability to engage in transfers since last OT session. Patient was able to transfer on 5/6 with min A and participate in functional mobility to the bathroom. Today, patient was +2 for safety to transfer to [facility] with patient needing TD to complete transfer with increased anxiousness. Nursing made aware with patient having all medications on board at this time. Patients O2 was 97% on 2L/min with SOB noted through session. Patient's discharge plan remains appropriate at this time. OT will continue to follow acutely.     Recommendations for follow up therapy are one component of a multi-disciplinary discharge planning process, led by the attending physician.  Recommendations [place] be updated based on patient status, additional functional criteria and insurance authorization.    Assistance Recommended at Discharge Frequent or constant Supervision/Assistance  Patient can return home with the following  A little help with walking and/or transfers;A lot of help with bathing/dressing/bathroom;Assistance with cooking/housework;Direct supervision/assist for medications management;Assist for transportation;Direct supervision/assist for financial management;Help with stairs or ramp for entrance         Precautions / Restrictions Precautions Precautions: Fall Required Braces or Orthoses: Knee Immobilizer - Right;Spinal Brace Knee Immobilizer - Right: On when out of bed or walking Spinal Brace: Other (comment) (LSO) Restrictions Weight Bearing Restrictions: No Other Position/Activity  Restrictions: WBAT       Mobility Bed Mobility               General bed mobility comments: pt received in recliner and returned to same           ADL either performed or assessed with clinical judgement   ADL Overall ADL's : Needs assistance/impaired         Upper Body Bathing: Min guard;Sitting Upper Body Bathing Details (indicate cue type and reason): in recliner with increased time. Lower Body Bathing: Sitting/lateral leans;Sit to/from stand;Maximal assistance Lower Body Bathing Details (indicate cue type and reason): patients KI was noted to be low on patient with it repositioned and re education provided to patient on proper placement of KI on leg. Upper Body Dressing : Sitting;Set up Upper Body Dressing Details (indicate cue type and reason): in recliner   Lower Body Dressing Details (indicate cue type and reason): TD for socks with education on using blue socks to reduce cutting into skin that grey socks were noted to do. Toilet Transfer: Moderate assistance;+2 for safety/equipment Toilet Transfer Details (indicate cue type and reason): patient was mod A +2 for sit to stand with patient very anxious about SPT to [facility]. patient was increasingly anxious not being able to follow directions with increased difficulty moving feet. patient reported "turn me" when standing to get to [facility]. patient was educated that she needed to get BLE under her and then slowly advance BLE to recliner that therapist was unable to "turn her". patient was noetd to throw herself into [facility] with TD to ensure safe and secure landing into [facility]. patient then reported " i did a good transfer this AM while therapist was attempting to educate on safety with transfers. patient was  educated on importance of taking each transfer slow. same issue when returning to recliner with increased SOB noted. O2 on 2L/min noted to be 97%. Toileting- Clothing Manipulation and Hygiene: Total assistance;Sit to/from stand                 Cognition Arousal/Alertness: Awake/alert Behavior During Therapy: Anxious Overall Cognitive Status: Within Functional Limits for tasks assessed           General Comments: patient was noted to be anxious patient was SOB with anxiousness about transitioning to SNF. patient reported " they wont be able to handle this at the SNF"                   Pertinent Vitals/ Pain       Pain Assessment Pain Assessment: Faces Faces Pain Scale: Hurts even more Pain Location: back and R LE-hip to ankle Pain Descriptors / Indicators: Discomfort, Grimacing, Sore Pain Intervention(s): Limited activity within patient's tolerance, Monitored during session, Repositioned         Frequency  Min 2X/week        Progress Toward Goals  OT Goals(current goals can now be found in the care plan section)  Progress towards OT goals: Not progressing toward goals - comment (huge change in status)     Plan Discharge plan remains appropriate       AM-PAC OT "6 Clicks" Daily Activity     Outcome Measure   Help from another person eating meals?: None Help from another person taking care of personal grooming?: A Little Help from another person toileting, which includes using toliet, bedpan, or urinal?: Total Help from another person bathing (including washing, rinsing, drying)?: A Lot Help from another person to put on and taking off regular upper body clothing?: A Little Help from another person to put on and taking off regular lower body clothing?: A Lot 6 Click Score: 15    End of Session Equipment Utilized During Treatment: Gait belt;Rolling walker (2 wheels);Other (comment);Right knee immobilizer  OT Visit Diagnosis: Unsteadiness on feet (R26.81);Other abnormalities of gait and mobility (R26.89);Muscle weakness (generalized) (M62.81);Pain   Activity Tolerance Patient tolerated treatment well;Patient limited by pain   Patient Left in chair;with call bell/phone within reach    Nurse Communication Mobility status;Other (comment) (patients need for increased A for transfers.)        Time: 1610-9604 OT Time Calculation (min): 40 min  Charges: OT General Charges $OT Visit: 1 Visit OT Treatments $Self Care/Home Management : 38-52 mins  Rosalio Loud, MS Acute Rehabilitation Department Office# [phone]   Selinda Flavin 01/17/2023, 8:58 AM

## 2023-01-17 NOTE — Progress Notes (Signed)
RN called [employer], gave report to Amy. Amy is familiar with pt and has no additional questions at this time. Amy is aware pt is 13th on list for PTAR to be transported.

## 2023-01-17 NOTE — Discharge Summary (Signed)
Physician Discharge Summary  [name] ZOX:[id number] DOB: April 27, 1935 DOA: 01/08/2023  PCP: Donita Brooks, MD  Admit date: 01/08/2023 Discharge date: 01/17/2023  Admitted From: Home Disposition: [facility]     Recommendations for Outpatient Follow-up:  Follow up with PCP in 1-2 weeks Follow-up with orthopedics, Dr. Shelle Iron in 2 weeks with repeat x-rays Follow-up with gastroenterology 1 month, Dr. Lorenso Quarry; recommended outpatient esophageal manometry study Continue sodium chloride 2 g p.o. twice daily for hyponatremia Torsemide decreased to 20 mg p.o. daily Please obtain BMP/CBC in one week to assess sodium level, potassium, creatinine and hemoglobin Repeat vitamin D level in 3-4 months; started ergocalciferol for vitamin D deficiency Please follow up on the following pending results: Gastric/esophageal biopsy results that are pending at time of discharge  Discharge Condition: Stable CODE STATUS: Full code Diet recommendation: Heart healthy diet  History of present illness:  Jaqualine Mowles [name] is a 88 y.o. female with past medical history significant for coronary artery disease (cath 12/2020 with nonobstructive CAD), L3 and L4 kyphoplasty (11/2022), mild/moderate aortic stenosis (AVA 1.42cm2 via Echo 03/2022), chronic hyponatremia, permanent atrial fibrillation on Eliquis, diastolic congestive heart failure with Takotsubo cardiomyopathy in the past, hypertension, anemia of chronic disease presenting to [facility] emergency department on 5/4 with complaints of fall after tripping and low back pain.   Upon evaluation in the emergency department trauma evaluation included a CTA scan of the right femur revealing an acute nondisplaced fracture across the fibular head and neck junction along with CT imaging of the pelvis identifying an L5 superior endplate central compression fracture with slight posterior superior retropulsion and fragmentation. EDP discussed case with Dr. Shon Baton with  orthopedic surgery who agreed to consult.  The hospitalist group was then called to assess the patient for admission to the hospital.   Dr. Shon Baton recommended TLSO bracing and conservative measures for management of the compression fracture.  They additionally felt that the right fibular head fracture likely also did not require surgical intervention.     Hospital course was complicated by development of hyperkalemia on hospital day 3 requiring gentle hydration, multiple doses of Lokelma as well as dextrose and insulin.  Hospital course was also complicated by development of progressive anemia, prompting intravenous Protonix and GI consultation.  Hospital course:  Closed compression fracture of L5 lumbar vertebra, initial encounter  Patient presenting to the ED with acute low back pain following mechanical fall.  MR L-spine without contrast with no acute L5 superior endplate compression fracture with 20% height loss without retropulsion, L4 compression fracture noted s/p interval cement augmentation with persistent diffuse marrow edema but no progressive height loss, nearly healed L3 compression fracture and unchanged chronic L1/L2 compression deformities; noted worsened moderate spinal canal stenosis.  Orthopedics, Dr. Shon Baton was consulted and recommended TLSO bracing and conservative measures for pain management.  Weightbearing as tolerates w/ TLSO brain.  Tylenol, Percocet, Robaxin as needed for pain control. Outpatient f/u Dr. Shelle Iron in 2 weeks with repeat Xrays   Closed right fibular fracture Evaluated by orthopedics, with recommendation of conservative management as this is not weightbearing. WBAT with knee immobilizer.  Outpatient follow-up with orthopedics as above   Vitamin D deficiency Low vitamin D of 26.24 on this hospitalization despite outpatient supplementation. Started ergocalciferol 50,000 units q. weekly.  Will need repeat vitamin D level in 3-4 months   Acute blood loss anemia 2/2  UGIB/AVM Patient reports ongoing dark stools, although on oral iron therapy.  Elevated BUN.  Complicated by  use of anticoagulation with Eliquis for underlying atrial fibrillation.  FOBT was positive.  GI was consulted and patient underwent EGD on 01/15/2023 with finding of an AVM First portion of duodenum s/p argon plasma coagulation.  Underwent small bowel enteroscopy on 01/16/2023 with no significant findings.  Transfused 1 unit PRBC on 5/11.  Hemoglobin stable 8.6 at time of discharge.  Esophageal/gastric biopsy pending at time of discharge.  Outpatient follow-up with gastroenterology and 1 month.  Recommended outpatient esophageal manometry study.  Recommend repeat CBC 1 week to reassess hemoglobin level now restarted on Eliquis.   Hyperkalemia: Resolved Patient with recurrent hyperkalemia of unknown etiology. Developed hyperkalemia several weeks ago and at that time cardiology had discontinued her spironolactone. Hyperkalemia in the setting of rising BUN, dropping hemoglobin and hematocrit and reports of black stool being about concern for upper gastrointestinal bleeding.  Received Lokelma, D50, insulin and gentle intravenous fluids with now resolution.  No restarted on lower dose of torsemide.  Repeat BMP 1 week.   Acute on chronic hyponatremia Reportedly "chronic" hyponatremia however baseline sodium after chart review reveals baseline sodium in the low 130s. Sodium is currently lower than baseline, with concurrent hypochloremia suggesting volume depletion; complicated by home use of torsemide 40 mg p.o. twice daily.  Urine sodium 22, urine osmolality 267.  Sodium trended down to a low of 123.  Started on sodium chloride 2 g p.o. twice daily.  Restart home torsemide at a lower dose 20 mg p.o. daily.  Repeat BMP 1 week.   CKD stage IIIb Baseline creatinine 1.2-1.3 over past year.  Likely higher creatinine due to overdiuresis with home torsemide dose of 40 mg p.o. twice daily.  Creatinine improved to  0.94 at time of discharge.  Recommend repeat BMP 1 week.   Persistent atrial fibrillation  Continue Eliquis 5 mg p.o. twice daily   Chronic diastolic CHF (congestive heart failure); compensated No clinical evidence of cardiogenic volume overload.  Restart home torsemide at a lower dose 20 mg p.o. daily.  Recommend daily weights.  Repeat BMP 1 week.    Essential hypertension Hx CAD Amlodipine 5 mg p.o. daily, clonidine 0.2 mg p.o. twice daily, torsemide 20 mg p.o. daily.  Ranolazine daily as needed for chest pain.  COPD Continue Symbicort   Anxiety Clonazepam 0.5 mg p.o. twice daily as needed  Discharge Diagnoses:  Principal Problem:   Closed compression fracture of L5 lumbar vertebra, initial encounter [facility]) Active Problems:   Closed right fibular fracture   Acute blood loss anemia   Hyperkalemia   Hyponatremia   Left ankle pain   Persistent atrial fibrillation (HCC)   Chronic diastolic CHF (congestive heart failure) (HCC)   Essential hypertension   Vitamin D deficiency   Permanent atrial fibrillation (HCC)   Closed disp comminuted fracture of shaft of right fibula with malunion   Fall   GAD (generalized anxiety disorder)   Anemia of chronic disease   Closed fracture of head of right fibula   Lumbar compression fracture, closed, initial encounter [facility])    Discharge Instructions  Discharge Instructions     Call MD for:  difficulty breathing, headache or visual disturbances   Complete by: As directed    Call MD for:  extreme fatigue   Complete by: As directed    Call MD for:  persistant dizziness or light-headedness   Complete by: As directed    Call MD for:  persistant nausea and vomiting   Complete by: As directed    Call MD  for:  severe uncontrolled pain   Complete by: As directed    Call MD for:  temperature >100.4   Complete by: As directed    Diet - low sodium heart healthy   Complete by: As directed    Increase activity slowly   Complete by: As  directed    No wound care   Complete by: As directed       Allergies as of 01/17/2023       Reactions   Contrast Media [iodinated Contrast Media] Hives, Other (See Comments)   Chest pain and burning   Clonidine Derivatives Other (See Comments)   Dry mouth, throat   Statins Other (See Comments)   Myalgias   Sulfa Antibiotics Diarrhea   Tremors    Celebrex [celecoxib] Rash   Isordil [isosorbide Nitrate] Itching, Rash   Tape Itching, Rash   Redness at application site Reaction is to paper and plastic tapes and EKG leads        Medication List     STOP taking these medications    HYDROmorphone 4 MG tablet Commonly known as: Dilaudid   Lokelma 10 g Pack packet Generic drug: sodium zirconium cyclosilicate   oxyCODONE-acetaminophen 10-325 MG tablet Commonly known as: PERCOCET Replaced by: oxyCODONE-acetaminophen 5-325 MG tablet   predniSONE 20 MG tablet Commonly known as: DELTASONE       TAKE these medications    acetaminophen 325 MG tablet Commonly known as: TYLENOL Take 2 tablets (650 mg total) by mouth every 6 (six) hours as needed for moderate pain.   albuterol 108 (90 Base) MCG/ACT inhaler Commonly known as: VENTOLIN HFA Inhale 2 puffs into the lungs every 6 (six) hours as needed for wheezing or shortness of breath.   amLODipine 5 MG tablet Commonly known as: NORVASC Take 5 mg by mouth daily.   apixaban 5 MG Tabs tablet Commonly known as: ELIQUIS Take 1 tablet (5 mg total) by mouth 2 (two) times daily.   bisacodyl 10 MG suppository Commonly known as: DULCOLAX Place 1 suppository (10 mg total) rectally as needed for moderate constipation.   budesonide-formoterol 80-4.5 MCG/ACT inhaler Commonly known as: Symbicort Inhale 2 puffs into the lungs in the morning and at bedtime. What changed:  when to take this additional instructions   clonazePAM 0.5 MG tablet Commonly known as: KLONOPIN Take 1 tablet by mouth twice daily as needed for anxiety    cloNIDine 0.2 MG tablet Commonly known as: CATAPRES Take 1 tablet (0.2 mg total) by mouth 2 (two) times daily.   famotidine 20 MG tablet Commonly known as: PEPCID Take 20 mg by mouth at bedtime as needed for heartburn.   ferrous sulfate 325 (65 FE) MG tablet Take 1 tablet (325 mg total) by mouth daily with breakfast.   fluticasone 50 MCG/ACT nasal spray Commonly known as: FLONASE Place 1 spray into both nostrils daily as needed for allergies or rhinitis.   methocarbamol 500 MG tablet Commonly known as: ROBAXIN Take 1 tablet (500 mg total) by mouth every 6 (six) hours as needed for muscle spasms.   nitroGLYCERIN 0.4 MG SL tablet Commonly known as: NITROSTAT DISSOLVE ONE TABLET UNDER THE TONGUE EVERY 5 MINUTES AS NEEDED FOR CHEST PAIN.  DO NOT EXCEED A TOTAL OF 3 DOSES IN 15 MINUTES   ondansetron 4 MG disintegrating tablet Commonly known as: ZOFRAN-ODT Take 1 tablet (4 mg total) by mouth every 8 (eight) hours as needed for nausea or vomiting.   oxyCODONE-acetaminophen 5-325 MG tablet Commonly known as:  PERCOCET/ROXICET Take 1 tablet by mouth every 6 (six) hours as needed for moderate pain. Replaces: oxyCODONE-acetaminophen 10-325 MG tablet   pantoprazole 40 MG tablet Commonly known as: PROTONIX Take 1 tablet (40 mg total) by mouth daily.   polyethylene glycol powder 17 GM/SCOOP powder Commonly known as: MiraLax Take 17 g by mouth 2 (two) times daily as needed for mild constipation.   ranolazine 500 MG 12 hr tablet Commonly known as: RANEXA Take 500 mg by mouth daily as needed (chest pain).   senna-docusate 8.6-50 MG tablet Commonly known as: Senokot-S Take 2 tablets by mouth 2 (two) times daily between meals as needed for moderate constipation.   sodium chloride 1 g tablet Take 2 tablets (2 g total) by mouth 2 (two) times daily with a meal.   torsemide 20 MG tablet Commonly known as: DEMADEX Take 1 tablet (20 mg total) by mouth daily. What changed:  how much to  take when to take this   Vitamin D (Ergocalciferol) 1.25 MG (50000 UNIT) Caps capsule Commonly known as: DRISDOL Take 1 capsule (50,000 Units total) by mouth every 7 (seven) days.        Contact information for follow-up providers     Donita Brooks, MD. Schedule an appointment as soon as possible for a visit in 1 week(s).   Specialty: Family Medicine Contact information: [address] [place] [address] [phone]         Lynann Bologna, DO. Schedule an appointment as soon as possible for a visit in 1 month(s).   Specialty: Gastroenterology Contact information: [address] [place] [address] [phone]              Contact information for after-discharge care     Destination     [facility] .   Service: Skilled Nursing Contact information: [address] [place] [address] [phone]                    Allergies  Allergen Reactions   Contrast Media [Iodinated Contrast Media] Hives and Other (See Comments)    Chest pain and burning    Clonidine Derivatives Other (See Comments)    Dry mouth, throat   Statins Other (See Comments)    Myalgias   Sulfa Antibiotics Diarrhea    Tremors    Celebrex [Celecoxib] Rash   Isordil [Isosorbide Nitrate] Itching and Rash   Tape Itching and Rash    Redness at application site Reaction is to paper and plastic tapes and EKG leads    Consultations: [facility]   Procedures/Studies: DG Lumbar Spine 2-3 Views  Result Date: 01/11/2023 CLINICAL DATA:  Back pain. EXAM: LUMBAR SPINE - 2-3 VIEW COMPARISON:  Lumbar spine radiographs 12/09/2017, MRI 01/11/2023. FINDINGS: The lowest disc space is designated L5-S1, in keeping with the same-day MRI. Compression deformities of the L1 and L2 vertebral bodies are unchanged, worse at L1. The compression  deformity of the L5 vertebral body seen on the same-day MRI is not well seen on the current study. Vertebral augmentation changes are noted at L3 and L4. Mild grade 1 retrolisthesis of L1 on L2 and L2 on L3 is again noted. Alignment is otherwise normal. There is disc space narrowing most advanced at L5-S1 with vacuum disc phenomenon. There is mild disc height loss at the other levels. There is multilevel degenerative endplate change. The SI  joints are intact.  The soft tissues are unremarkable. IMPRESSION: 1. Unchanged compression deformity of the L1 and L2 vertebral bodies. The compression deformity of the L5 vertebral body seen on the same-day MRI is not well seen on the current study. 2. Vertebral augmentation changes at L3 and L4. Electronically Signed   By: Lesia Hausen M.D.   On: 01/11/2023 13:21   MR LUMBAR SPINE WO CONTRAST  Result Date: 01/11/2023 CLINICAL DATA:  Fall.  New L5 compression fracture. EXAM: MRI LUMBAR SPINE WITHOUT CONTRAST TECHNIQUE: Multiplanar, multisequence MR imaging of the lumbar spine was performed. No intravenous contrast was administered. COMPARISON:  MRI lumbar spine dated November 18, 2022. FINDINGS: Segmentation:  Standard. Alignment:  Unchanged mild retrolisthesis at L1-L2. Vertebrae: New acute L5 superior endplate compression fracture with 20% height loss. No retropulsion. Status post L4 cement augmentation with persistent diffuse marrow edema, but no progressive height loss. Nearly healed L3 compression fracture status post cement augmentation with minimal residual marrow edema. Chronic L1 and L2 compression deformities are unchanged. No evidence of discitis or suspicious bone lesion. Conus medullaris and cauda equina: Conus extends to the L1 level. Conus and cauda equina appear normal. Paraspinal and other soft tissues: Negative. Disc levels: T12-L1: Unchanged minimal disc bulging and associated endplate spurring. No stenosis. L1-L2: Unchanged mild disc bulging and endplate  spurring asymmetric to the right. Unchanged mild right lateral recess stenosis. No spinal canal or neuroforaminal stenosis. L2-L3: Unchanged disc bulging and endplate spurring asymmetric to the left. No stenosis. L3-L4: Unchanged mild disc bulging, endplate spurring, and bilateral facet arthropathy. Unchanged mild bilateral lateral recess stenosis. No spinal canal or neuroforaminal stenosis. L4-L5: Unchanged mild far lateral disc bulging. Similar moderate bilateral facet arthropathy with slight interval increase in size of the intraspinal right-sided synovial cyst. Worsened moderate spinal canal stenosis. Worsened moderate right and mild left neuroforaminal stenosis. L5-S1: Unchanged small circumferential disc osteophyte complex and mild bilateral facet arthropathy. No stenosis. IMPRESSION: 1. New acute L5 superior endplate compression fracture with 20% height loss and no retropulsion. 2. L4 compression fracture status post interval cement augmentation with persistent diffuse marrow edema, but no progressive height loss. 3. Nearly healed L3 compression fracture status post cement augmentation with minimal residual marrow edema. 4. Unchanged chronic L1 and L2 compression deformities. 5. Multilevel lumbar spondylosis as described above, progressive at L4-L5 where there is worsened moderate spinal canal and right neuroforaminal stenosis. Electronically Signed   By: Obie Dredge M.D.   On: 01/11/2023 10:08   CT ANKLE LEFT WO CONTRAST  Result Date: 01/11/2023 CLINICAL DATA:  Left ankle pain after fall. EXAM: CT OF THE LEFT ANKLE WITHOUT CONTRAST TECHNIQUE: Multidetector CT imaging of the left ankle was performed according to the standard protocol. Multiplanar CT image reconstructions were also generated. RADIATION DOSE REDUCTION: This exam was performed according to the departmental dose-optimization program which includes automated exposure control, adjustment of the mA and/or kV according to patient size and/or  use of iterative reconstruction technique. COMPARISON:  Left foot x-rays dated Fike 4, 2024. FINDINGS: Bones/Joint/Cartilage Evaluation is limited by osteopenia, but there is no definite acute fracture. No dislocation. The ankle mortise is symmetric. The talar dome is intact. Mild midfoot osteoarthritis. No joint effusion. Ligaments Ligaments are suboptimally evaluated by CT. Muscles and Tendons Grossly intact. Soft tissue No fluid collection or hematoma.  No soft tissue mass. IMPRESSION: 1. No definite acute osseous abnormality. Evaluation is limited by osteopenia. Electronically Signed   By: Obie Dredge M.D.   On: 01/11/2023  09:41   CT Head Wo Contrast  Result Date: 01/08/2023 CLINICAL DATA:  Head trauma, minor (Age >= 65y); Neck trauma (Age >= 65y) EXAM: CT HEAD WITHOUT CONTRAST CT CERVICAL SPINE WITHOUT CONTRAST TECHNIQUE: Multidetector CT imaging of the head and cervical spine was performed following the standard protocol without intravenous contrast. Multiplanar CT image reconstructions of the cervical spine were also generated. RADIATION DOSE REDUCTION: This exam was performed according to the departmental dose-optimization program which includes automated exposure control, adjustment of the mA and/or kV according to patient size and/or use of iterative reconstruction technique. COMPARISON:  None Available. FINDINGS: CT HEAD FINDINGS Brain: Normal anatomic configuration. Parenchymal volume loss is commensurate with the patient's age. Mild periventricular white matter changes are present likely reflecting the sequela of small vessel ischemia. Remote lacunar infarct within the left thalamus. No abnormal intra or extra-axial mass lesion or fluid collection. No abnormal mass effect or midline shift. No evidence of acute intracranial hemorrhage or infarct. Stable borderline ventriculomegaly which appears commensurate with the degree of parenchymal volume loss and likely represents central atrophy. Cerebellum  unremarkable. Vascular: No asymmetric hyperdense vasculature at the skull base. Skull: Intact Sinuses/Orbits: Paranasal sinuses are clear. Orbits are unremarkable. Other: Mastoid air cells and middle ear cavities are clear. CT CERVICAL SPINE FINDINGS Alignment: Normal. Skull base and vertebrae: No acute fracture. No primary bone lesion or focal pathologic process. Soft tissues and spinal canal: No prevertebral fluid or swelling. No visible canal hematoma. Disc levels: Intervertebral disc space narrowing endplate remodeling noted in keeping with changes of diffuse mild to moderate degenerative disc disease most severe at C6-7. Prevertebral soft tissues are not thickened on sagittal reformats. Spinal canal is widely patent. No high-grade neuroforaminal narrowing. Upper chest: Biapical scarring. Other: 2.9 cm left thyroid nodule noted, previous assessed on sonogram of 04/02/2014 and biopsied on 04/16/2014. IMPRESSION: 1. No acute intracranial abnormality. No calvarial fracture. 2. Stable senescent change. 3. No acute fracture or listhesis of the cervical spine. 4. 2.9 cm left thyroid nodule noted, previous assessed on sonogram of 04/02/2014 and biopsied on 04/16/2014. Electronically Signed   By: Helyn Numbers M.D.   On: 01/08/2023 23:12   CT Cervical Spine Wo Contrast  Result Date: 01/08/2023 CLINICAL DATA:  Head trauma, minor (Age >= 65y); Neck trauma (Age >= 65y) EXAM: CT HEAD WITHOUT CONTRAST CT CERVICAL SPINE WITHOUT CONTRAST TECHNIQUE: Multidetector CT imaging of the head and cervical spine was performed following the standard protocol without intravenous contrast. Multiplanar CT image reconstructions of the cervical spine were also generated. RADIATION DOSE REDUCTION: This exam was performed according to the departmental dose-optimization program which includes automated exposure control, adjustment of the mA and/or kV according to patient size and/or use of iterative reconstruction technique. COMPARISON:   None Available. FINDINGS: CT HEAD FINDINGS Brain: Normal anatomic configuration. Parenchymal volume loss is commensurate with the patient's age. Mild periventricular white matter changes are present likely reflecting the sequela of small vessel ischemia. Remote lacunar infarct within the left thalamus. No abnormal intra or extra-axial mass lesion or fluid collection. No abnormal mass effect or midline shift. No evidence of acute intracranial hemorrhage or infarct. Stable borderline ventriculomegaly which appears commensurate with the degree of parenchymal volume loss and likely represents central atrophy. Cerebellum unremarkable. Vascular: No asymmetric hyperdense vasculature at the skull base. Skull: Intact Sinuses/Orbits: Paranasal sinuses are clear. Orbits are unremarkable. Other: Mastoid air cells and middle ear cavities are clear. CT CERVICAL SPINE FINDINGS Alignment: Normal. Skull base and vertebrae: No acute fracture.  No primary bone lesion or focal pathologic process. Soft tissues and spinal canal: No prevertebral fluid or swelling. No visible canal hematoma. Disc levels: Intervertebral disc space narrowing endplate remodeling noted in keeping with changes of diffuse mild to moderate degenerative disc disease most severe at C6-7. Prevertebral soft tissues are not thickened on sagittal reformats. Spinal canal is widely patent. No high-grade neuroforaminal narrowing. Upper chest: Biapical scarring. Other: 2.9 cm left thyroid nodule noted, previous assessed on sonogram of 04/02/2014 and biopsied on 04/16/2014. IMPRESSION: 1. No acute intracranial abnormality. No calvarial fracture. 2. Stable senescent change. 3. No acute fracture or listhesis of the cervical spine. 4. 2.9 cm left thyroid nodule noted, previous assessed on sonogram of 04/02/2014 and biopsied on 04/16/2014. Electronically Signed   By: Helyn Numbers M.D.   On: 01/08/2023 23:12   CT FEMUR RIGHT WO CONTRAST  Result Date: 01/08/2023 CLINICAL  DATA:  Mid femoral pain.  Fell. EXAM: CT OF THE LOWER RIGHT THIGH WITHOUT CONTRAST TECHNIQUE: Multidetector CT imaging of the right lower extremity was performed according to the standard protocol. RADIATION DOSE REDUCTION: This exam was performed according to the departmental dose-optimization program which includes automated exposure control, adjustment of the mA and/or kV according to patient size and/or use of iterative reconstruction technique. COMPARISON:  CT of abdomen and pelvis 09/01/2022, right knee films from today, AP pelvis and bilateral hip films from today, and right hip views from 12/16/2022. FINDINGS: Bones/Joint/Cartilage There is diffuse osteopenia. No displaced fracture seen of the visualized right hemipelvis, visualized right hemisacrum, the femur, and visualized proximal tibia. There is a nondisplaced acute transverse fracture across the fibular head and neck junction. There is mild nonerosive degenerative arthrosis at the right SI joint and right hip. No hip joint effusion is seen. There are tricompartmental degenerative changes at the knee, consisting of joint space loss and osteophytes of the lateral greater than medial femorotibial compartments and of the patellofemoral joint. There is no erosive arthropathy. There is a small low-density suprapatellar bursal effusion. There are degenerative subcortical cystic changes in the posterior aspect of the lateral femoral condyle. Ligaments Suboptimally assessed by CT. Muscles and Tendons Suboptimal depiction of the tendons with CT. No major tendon avulsion is seen. The extensor mechanism is intact. there is partial atrophy of the hip adductor musculature and gluteal muscles, partial atrophy of the vastus lateralis and medial head of the gastrocnemius. No intramuscular hematoma or mass are seen. Soft tissues There is no subcutaneous hematoma. No fluid, mass or hemorrhage in the right hemipelvis. Mild calcific plaque noted in the femoral artery,  tibioperoneal trunk and proximal anterior tibial artery. IMPRESSION: 1. Acute nondisplaced transverse fracture across the fibular head and neck junction. 2. Osteopenia and degenerative change. 3. No displaced fracture of the right femur or visualized proximal tibia. 4. Small low-density suprapatellar bursal effusion. No fat-fluid level or hemarthrosis. 5. Calcific plaque in the femoral artery, tibioperoneal trunk and proximal anterior tibial artery. 6. Remaining findings discussed above. Electronically Signed   By: Almira Bar M.D.   On: 01/08/2023 21:24   CT PELVIS WO CONTRAST  Result Date: 01/08/2023 CLINICAL DATA:  Right hip trauma, fracture suspected, right thigh pain. Fell today. EXAM: CT PELVIS WITHOUT CONTRAST TECHNIQUE: Multidetector CT imaging of the pelvis was performed following the standard protocol without intravenous contrast. RADIATION DOSE REDUCTION: This exam was performed according to the departmental dose-optimization program which includes automated exposure control, adjustment of the mA and/or kV according to patient size and/or use of iterative reconstruction  technique. COMPARISON:  Lumbar spine MRI 11/18/2022, lumbar spine CT 11/17/2022, CT abdomen and pelvis 09/01/2022. FINDINGS: Urinary Tract: The pelvic ureters are normal caliber. No ureteral stones are seen. The bladder wall and lumen are unremarkable. Bowel: Old postsurgical change transverse colon and rectosigmoid junction. Moderate fecal stasis. No bowel dilatation, wall thickening or inflammatory changes in the pelvis. Vascular/Lymphatic: There is aortoiliac scattered calcific plaque. No aneurysm is seen. Please note the aorta is only included just above the bifurcation. No lymphadenopathy or mass is seen. Reproductive:  No mass or other significant abnormality Other: Old cholecystectomy. Lower portions of the liver are unremarkable. There is no free air, free hemorrhage, fluid collections or ascites. There are no incarcerated  hernias. Musculoskeletal: Osteopenia. There is no sacral insufficiency fracture. No displaced fracture of the bony pelvis. Spurring and erosive changes are noted chronically at the symphysis pubis. The visualized proximal femurs are intact. There is mild symmetric joint space loss at the hips, small acetabular and bilateral femoral head spurs, without erosive arthropathy or joint effusions. At L5, there is a new (since the lumbar MRI of 11/18/2022) and possibly acute superior endplate concave central compression fracture of the L5 vertebral body, with 30% loss of the central vertebral height, slight posterosuperior cortical retropulsion, 20% posterior height loss and no notable anterior height loss. Interval kyphoplasty has been performed at L4 which is only partially imaged through the inferior endplate with the L4 inferior endplate having undergone further fragmentation since the prior studies. The pedicles and posterior elements are intact. No canal hematoma is seen. Acquired spinal stenosis, moderate to severe at the L4-5 level appears unchanged since 09/01/2022. Facet hypertrophy and lumbar spondylosis, and spinous process abutment L3-S1 are unchanged. IMPRESSION: 1. New L5 superior endplate concave central compression fracture with 30% loss of the central vertebral height, 20% posterior height loss and slight posterosuperior cortical retropulsion and fragmentation. 2. I would assume this is probably acute although could have occurred anytime since 11/18/2022 MRI. 3. No canal hematoma is seen. The degree of acquired spinal stenosis at L4-5 has not appreciably changed since 09/01/2022. 4. Interval L4 kyphoplasty with further fragmentation of the L4 inferior endplate since the March studies. 5. Osteopenia and degenerative change with no displaced pelvic, sacral or proximal femoral fractures. 6. Constipation and diverticulosis.  Postsurgical changes. 7. Aortic atherosclerosis. Aortic Atherosclerosis (ICD10-I70.0).  Electronically Signed   By: Almira Bar M.D.   On: 01/08/2023 21:03   DG Knee Right Port  Result Date: 01/08/2023 CLINICAL DATA:  Pain, fell EXAM: PORTABLE RIGHT KNEE - 1-2 VIEW COMPARISON:  None Available. FINDINGS: Frontal, bilateral oblique, lateral views of the right knee are obtained. No acute fracture, subluxation, or dislocation. Mild 3 compartmental osteoarthritis. No joint effusion. Diffuse subcutaneous edema. IMPRESSION: 1. No acute fracture. 2. 3 compartmental osteoarthritis. 3. Diffuse subcutaneous edema. Electronically Signed   By: Sharlet Salina M.D.   On: 01/08/2023 18:52   DG Tibia/Fibula Left  Result Date: 01/08/2023 CLINICAL DATA:  Larey Seat, pain EXAM: LEFT TIBIA AND FIBULA - 2 VIEW COMPARISON:  None Available. FINDINGS: Frontal and lateral views of the left tibia and fibula are obtained. The bones are diffusely osteopenic. No acute fracture. Alignment of the left knee and ankle is anatomic. Mild osteoarthritis. There is diffuse subcutaneous edema. IMPRESSION: 1. No acute displaced fracture. 2. Osteopenia. 3. Osteoarthritis of the left knee and ankle. 4. Diffuse soft tissue edema. Electronically Signed   By: Sharlet Salina M.D.   On: 01/08/2023 18:50   DG Tibia/Fibula Right  Result Date: 01/08/2023 CLINICAL DATA:  Larey Seat, pain EXAM: RIGHT TIBIA AND FIBULA - 2 VIEW COMPARISON:  None Available. FINDINGS: Frontal and lateral views of the right tibia and fibula are obtained. Bones are diffusely osteopenic. No acute displaced fracture. Alignment is anatomic. Mild osteoarthritis of the right hip and knee. Mild diffuse subcutaneous edema. IMPRESSION: 1. No acute fracture. 2. Osteopenia. 3. Osteoarthritis of the right knee and ankle. Electronically Signed   By: Sharlet Salina M.D.   On: 01/08/2023 18:49   DG Hips Bilat W or Wo Pelvis 3-4 Views  Result Date: 01/08/2023 CLINICAL DATA:  Bilateral hip and thigh pain after fall EXAM: DG HIP (WITH OR WITHOUT PELVIS) 3-4V BILAT COMPARISON:  None  Available. FINDINGS: Demineralization. There is no evidence of hip fracture or dislocation. Degenerative changes pubic symphysis, both hips, SI joints and lower lumbar spine. Vertebroplasty L4. IMPRESSION: No acute fracture or dislocation. Electronically Signed   By: Minerva Fester M.D.   On: 01/08/2023 18:48   DG Foot 2 Views Left  Result Date: 01/08/2023 CLINICAL DATA:  Larey Seat, left foot pain EXAM: LEFT FOOT - 2 VIEW COMPARISON:  None Available. FINDINGS: Frontal and lateral views of the left foot are obtained. The bones are diffusely osteopenic. Hammertoe deformities are noted. There are no acute displaced fractures. There is multifocal osteoarthritis greatest at the first metatarsophalangeal joint and midfoot. Diffuse soft tissue edema. IMPRESSION: 1. No acute fracture. 2. Osteopenia, with hammertoe deformities and multifocal osteoarthritis as above. 3. Diffuse subcutaneous edema. Electronically Signed   By: Sharlet Salina M.D.   On: 01/08/2023 18:47     Subjective: Patient seen examined bedside, resting comfortably.  Sitting in bedside chair.  OT present.  Continues to complain of mild hip/low back/pelvis pain.  Sodium stable, hemoglobin up to 8.6 and restarted Eliquis yesterday.  Discharging to SNF today for further rehabilitation.  No other specific questions or concerns at this time.  Denies headache, no dizziness, no chest pain, no palpitations, no shortness of breath, no abdominal pain, no fever/chills/night sweats, no nausea/vomiting/diarrhea, no focal weakness, no cough/congestion, no fatigue, no paresthesias.  No acute events overnight per nursing staff.  Discharge Exam: Vitals:   01/17/23 0536 01/17/23 0801  BP: (!) 179/55   Pulse: (!) 55   Resp: 16   Temp: 97.9 F (36.6 C)   SpO2: 93% 96%   Vitals:   01/16/23 1312 01/16/23 2035 01/17/23 0536 01/17/23 0801  BP: (!) 151/50 (!) 156/52 (!) 179/55   Pulse: (!) 47 (!) 53 (!) 55   Resp: 16 17 16    Temp: 97.9 F (36.6 C) 98.1 F (36.7  C) 97.9 F (36.6 C)   TempSrc: Oral Oral Oral   SpO2: 93% 96% 93% 96%  Weight:      Height:        Physical Exam: GEN: NAD, alert and oriented x 3, chronically ill/elderly in appearance HEENT: NCAT, PERRL, EOMI, sclera clear, MMM PULM: CTAB w/o wheezes/crackles, normal respiratory effort, on room air CV: RRR w/o M/G/R GI: abd soft, NTND, NABS, no R/G/M MSK: Trace-1+ pitting edema bilateral lower extremities to mid shin, moves all extremities independently  NEURO: No focal neurological deficits noted PSYCH: normal mood/affect Integumentary: dry/intact, no rashes or wounds    The results of significant diagnostics from this hospitalization (including imaging, microbiology, ancillary and laboratory) are listed below for reference.     Microbiology: No results found for this or any previous visit (from the past 240 hour(s)).   Labs: BNP (last  3 results) Recent Labs    11/10/22 1454 11/18/22 0635 12/14/22 1246  BNP 119.2* 136.1* 117.9*   Basic Metabolic Panel: Recent Labs  Lab 01/11/23 0316 01/11/23 1738 01/12/23 0103 01/12/23 2113 01/13/23 0333 01/13/23 1233 01/14/23 0326 01/15/23 0306 01/16/23 0323 01/17/23 0327  NA 127*   < > 123*   < > 125* 127* 127* 131* 132* 132*  K 5.5*   < > 4.8  --  5.0  --  5.1 5.0 5.3* 5.2*  CL 90*   < > 88*  --  93*  --  98 103 105 108  CO2 31   < > 28  --  26  --  22 22 19* 18*  GLUCOSE 118*   < > 94  --  104*  --  104* 103* 110* 109*  BUN 45*   < > 40*  --  34*  --  30* 27* 25* 24*  CREATININE 1.33*   < > 1.32*  --  1.36*  --  1.23* 1.11* 1.02* 0.94  CALCIUM 8.2*   < > 8.0*  --  7.9*  --  8.2* 8.4* 8.6* 8.5*  MG 3.0*  --  2.8*  --   --   --   --   --   --   --    < > = values in this interval not displayed.   Liver Function Tests: Recent Labs  Lab 01/11/23 0316 01/12/23 0103  AST 16 13*  ALT 21 18  ALKPHOS 72 65  BILITOT 0.6 0.6  PROT 5.5* 5.5*  ALBUMIN 2.8* 2.7*   No results for input(s): "LIPASE", "AMYLASE" in the  last 168 hours. No results for input(s): "AMMONIA" in the last 168 hours. CBC: Recent Labs  Lab 01/11/23 0316 01/11/23 1738 01/12/23 0103 01/12/23 1408 01/13/23 0333 01/13/23 1233 01/14/23 0326 01/15/23 0306 01/16/23 0323 01/17/23 0327  WBC 6.5   < > 6.5  --  4.9  --  5.0 4.3 4.2 4.5  NEUTROABS 3.8  --  4.0  --   --   --   --   --   --   --   HGB 8.1*   < > 7.9*   < > 7.5* 8.9* 7.5* 7.0* 8.3* 8.6*  HCT 25.5*   < > 24.7*   < > 24.0* 28.0* 24.0* 22.7* 26.5* 27.7*  MCV 95.9   < > 95.4  --  98.0  --  97.2 97.8 98.5 101.5*  PLT 178   < > 178  --  183  --  187 178 191 195   < > = values in this interval not displayed.   Cardiac Enzymes: No results for input(s): "CKTOTAL", "CKMB", "CKMBINDEX", "TROPONINI" in the last 168 hours. BNP: Invalid input(s): "POCBNP" CBG: No results for input(s): "GLUCAP" in the last 168 hours. D-Dimer No results for input(s): "DDIMER" in the last 72 hours. Hgb A1c No results for input(s): "HGBA1C" in the last 72 hours. Lipid Profile No results for input(s): "CHOL", "HDL", "LDLCALC", "TRIG", "CHOLHDL", "LDLDIRECT" in the last 72 hours. Thyroid function studies No results for input(s): "TSH", "T4TOTAL", "T3FREE", "THYROIDAB" in the last 72 hours.  Invalid input(s): "FREET3" Anemia work up No results for input(s): "VITAMINB12", "FOLATE", "FERRITIN", "TIBC", "IRON", "RETICCTPCT" in the last 72 hours. Urinalysis    Component Value Date/Time   COLORURINE YELLOW 01/09/2023 0948   APPEARANCEUR CLEAR 01/09/2023 0948   LABSPEC 1.012 01/09/2023 0948   PHURINE 5.0 01/09/2023 0948   GLUCOSEU NEGATIVE  01/09/2023 0948   HGBUR SMALL (A) 01/09/2023 0948   BILIRUBINUR NEGATIVE 01/09/2023 0948   KETONESUR NEGATIVE 01/09/2023 0948   PROTEINUR NEGATIVE 01/09/2023 0948   UROBILINOGEN 0.2 11/04/2013 1321   NITRITE NEGATIVE 01/09/2023 0948   LEUKOCYTESUR NEGATIVE 01/09/2023 0948   Sepsis Labs Recent Labs  Lab 01/14/23 0326 01/15/23 0306 01/16/23 0323  01/17/23 0327  WBC 5.0 4.3 4.2 4.5   Microbiology No results found for this or any previous visit (from the past 240 hour(s)).   Time coordinating discharge: Over 30 minutes  SIGNED:   Alvira Philips [place], DO  [facility] 01/17/2023, 8:43 AM

## 2023-01-17 NOTE — Consult Note (Signed)
   [facility] CM Inpatient Consult   01/17/2023  [name] [birth year] [id number]  [facility] [[facility]]  Accountable Care Organization [ACO] Patient:  [name] [facility] Medicare  Primary Care Provider:  Donita Brooks, MD with [facility]  [facility] Liaison remote coverage review for patient admitted to [facility]  Patient has been active according to encounter with the [facility] LCSW for resurces Patient was reviewed for post hospital transitional needs as with [facility] and extreme high risk for unplanned readmission risk score.  Remote review reveals patient is to transition to a skilled nursing facility level of care for rehab.   Plan: [facility] LCSW will be notified of transition to a SNF level of care for post hospital.  For questions or referrals, please contact:   Charlesetta Shanks, RN BSN CCM [facility]  [phone] business mobile phone Toll free office [phone]  *Concierge Line  [phone] Fax number: [phone] [name]

## 2023-01-17 NOTE — Progress Notes (Signed)
Patient was alert and oriented and vital signs stable at the start of this shift.Patient discharged to [facility] at 2220 from unit. All IVs taken out and patient education completed.

## 2023-01-17 NOTE — Plan of Care (Signed)
  Problem: Coping: Goal: Level of anxiety will decrease Outcome: Progressing   Problem: Pain Managment: Goal: General experience of comfort will improve Outcome: Progressing   Problem: Safety: Goal: Ability to remain free from injury will improve Outcome: Progressing   

## 2023-01-17 NOTE — Progress Notes (Addendum)
Physical Therapy Treatment Patient Details Name: [name] MRN: [id number] DOB: 1935/08/22 Today's Date: 01/17/2023   History of Present Illness Pt admitted from home s/p fall with L-5 compression fx and R fibular head fx (both nonoperative).  Pt with hx of HOH, CHF, colon CA, breast CA, CAD, COPD, CVA, afib, recent L3 and L4 compression fx with balloon kyphoplasty    PT Comments    Pt in recliner on arrival, RN in room; pt on O2 and reporting "nurse put it on" d/t SOB. RN removed, sats 99% on RA; 90% after activity (scooting and exercises), sats incr to 94% on RA with rest break ;  pt with 3/4 DOE after seated activity.  RN aware.  Pt requiring more assist this session, change in ability to perform STS transfers - unable to come to stand with heavy assist d/t pain and extremity/core weakness. Pt reports she was not OOB this weekend, had enteroscopy yesterday.  Pt follows commands consistently and is cooperative with PT  Will continue to follow.  D/C Plan remains appropriate.   Recommendations for follow up therapy are one component of a multi-disciplinary discharge planning process, led by the attending physician.  Recommendations Ara[place] be updated based on patient status, additional functional criteria and insurance authorization.  Follow Up Recommendations  Can patient physically be transported by private vehicle: No    Assistance Recommended at Discharge Intermittent Supervision/Assistance  Patient can return home with the following Two people to help with walking and/or transfers;A lot of help with bathing/dressing/bathroom;Assist for transportation;Help with stairs or ramp for entrance;Assistance with cooking/housework   Equipment Recommendations  None recommended by PT    Recommendations for Other Services       Precautions / Restrictions Precautions Precautions: Fall Required Braces or Orthoses: Knee Immobilizer - Right;Spinal Brace Knee Immobilizer - Right: On when out of  bed or walking Spinal Brace: Lumbar corset Restrictions Weight Bearing Restrictions: No Other Position/Activity Restrictions: WBAT     Mobility  Bed Mobility               General bed mobility comments: pt received in recliner    Transfers Overall transfer level: Needs assistance Equipment used: Rolling walker (2 wheels) Transfers: Sit to/from Stand             General transfer comment: attempted STS x3, pt is unable to transition to standing with heavy assist provided, pt unable to initiate lifting hips from chair    Ambulation/Gait               General Gait Details:  (unable)   Stairs             Wheelchair Mobility    Modified Rankin (Stroke Patients Only)       Balance     Sitting balance-Leahy Scale: Fair (Fair+, able to perform reciperocal scooting with min assist and cues to self assist)       Standing balance-Leahy Scale:  (unable)                              Cognition Arousal/Alertness: Awake/alert Behavior During Therapy: WFL for tasks assessed/performed, Anxious Overall Cognitive Status: Within Functional Limits for tasks assessed                                 General Comments: anxious regarding mobility, d/c plan, limited progress  Exercises General Exercises - Lower Extremity Ankle Circles/Pumps: AROM, Both, 10 reps Quad Sets: AROM, Both, 10 reps Heel Slides: AROM, Left, 10 reps, Strengthening (with manual resistance) Hip ABduction/ADduction: AROM, Both, 10 reps, AAROM, Strengthening    General Comments        Pertinent Vitals/Pain Pain Assessment Pain Assessment: Faces Faces Pain Scale: Hurts little more Pain Location: back Pain Descriptors / Indicators: Discomfort, Grimacing, Sore Pain Intervention(s): Limited activity within patient's tolerance, Monitored during session, Premedicated before session, Repositioned    Home Living                          Prior  Function            PT Goals (current goals can now be found in the care plan section) Acute Rehab PT Goals Patient Stated Goal: Less pain, regain IND PT Goal Formulation: With patient Time For Goal Achievement: 01/23/23 Potential to Achieve Goals: Good Progress towards PT goals: Progressing toward goals    Frequency    Min 1X/week      PT Plan Current plan remains appropriate    Co-evaluation              AM-PAC PT "6 Clicks" Mobility   Outcome Measure  Help needed turning from your back to your side while in a flat bed without using bedrails?: A Lot Help needed moving from lying on your back to sitting on the side of a flat bed without using bedrails?: A Lot Help needed moving to and from a bed to a chair (including a wheelchair)?: Total Help needed standing up from a chair using your arms (e.g., wheelchair or bedside chair)?: Total Help needed to walk in hospital room?: Total Help needed climbing 3-5 steps with a railing? : Total 6 Click Score: 8    End of Session Equipment Utilized During Treatment: Gait belt;Right knee immobilizer;Back brace Activity Tolerance: Patient limited by fatigue;Other (comment) (SOB) Patient left: in chair;with call bell/phone within reach;with family/visitor present (in chair, no alarm on arrival) Nurse Communication: Mobility status PT Visit Diagnosis: Unsteadiness on feet (R26.81);Muscle weakness (generalized) (M62.81);Difficulty in walking, not elsewhere classified (R26.2);Pain Pain - Right/Left: Right Pain - part of body: Leg (back)     Time: 4098-1191 PT Time Calculation (min) (ACUTE ONLY): 23 min  Charges:  $Therapeutic Activity: 8-22 mins                     Delice Bison, PT  Acute Rehab Dept [facility]) [phone]  01/17/2023    [facility] 01/17/2023, 12:06 PM

## 2023-01-18 ENCOUNTER — Encounter (HOSPITAL_COMMUNITY): Payer: Self-pay | Admitting: Internal Medicine

## 2023-01-18 LAB — SURGICAL PATHOLOGY

## 2023-01-24 ENCOUNTER — Encounter: Payer: Medicare Other | Admitting: *Deleted

## 2023-01-24 ENCOUNTER — Ambulatory Visit: Payer: Medicare Other | Admitting: Podiatry

## 2023-01-25 DIAGNOSIS — D509 Iron deficiency anemia, unspecified: Secondary | ICD-10-CM | POA: Diagnosis not present

## 2023-01-25 DIAGNOSIS — J302 Other seasonal allergic rhinitis: Secondary | ICD-10-CM | POA: Diagnosis not present

## 2023-01-25 DIAGNOSIS — G8929 Other chronic pain: Secondary | ICD-10-CM | POA: Diagnosis not present

## 2023-01-25 DIAGNOSIS — S32030D Wedge compression fracture of third lumbar vertebra, subsequent encounter for fracture with routine healing: Secondary | ICD-10-CM | POA: Diagnosis not present

## 2023-01-26 ENCOUNTER — Telehealth (HOSPITAL_COMMUNITY): Payer: Self-pay

## 2023-01-26 NOTE — Telephone Encounter (Signed)
Patient recently fractured right hip and is in rehab currently. The Nurse practitioner called to inquire about Torsemide. She was on 20mg  daily and it has been increased to 40mg  daily due to edema in legs. No other symptoms - no shortness of breath, weight is down, but she wanted to know if we wanted to change any medications due to the edema.  She is having a wound doctor come in just to evaluate and has drawn a CBC and BMET which were both ok.  Please advise.

## 2023-01-26 NOTE — Telephone Encounter (Signed)
Would give her compression stockings but would keep torsemide at 40 mg daily.

## 2023-01-26 NOTE — Telephone Encounter (Signed)
I spoke to rehab nurse and updated her on recommendations.

## 2023-01-27 DIAGNOSIS — I5032 Chronic diastolic (congestive) heart failure: Secondary | ICD-10-CM | POA: Diagnosis not present

## 2023-01-27 DIAGNOSIS — D631 Anemia in chronic kidney disease: Secondary | ICD-10-CM | POA: Diagnosis not present

## 2023-01-27 DIAGNOSIS — J449 Chronic obstructive pulmonary disease, unspecified: Secondary | ICD-10-CM | POA: Diagnosis not present

## 2023-01-27 DIAGNOSIS — L03116 Cellulitis of left lower limb: Secondary | ICD-10-CM | POA: Diagnosis not present

## 2023-01-28 ENCOUNTER — Encounter: Payer: Medicare Other | Admitting: Pharmacist

## 2023-01-28 DIAGNOSIS — M25561 Pain in right knee: Secondary | ICD-10-CM | POA: Diagnosis not present

## 2023-01-28 DIAGNOSIS — M5451 Vertebrogenic low back pain: Secondary | ICD-10-CM | POA: Diagnosis not present

## 2023-02-02 NOTE — Telephone Encounter (Signed)
NP at rehab called with an update on [name].  She is having increased shortness of breath especially on exertion and continuing with edema in legs. They did not have compression stockings in her size and they have been ordered. She wrapped her legs in Ace wraps today.  Her weight on admission was 196.5 and is now 191. It was 188 in the hospital. Please advise of any changes or if appointment is needed.

## 2023-02-02 NOTE — Telephone Encounter (Signed)
Increase torsemide to 40 qam/20 qpm and make sure she has an appointment with APP, ideally in the next week.

## 2023-02-03 DIAGNOSIS — D631 Anemia in chronic kidney disease: Secondary | ICD-10-CM | POA: Diagnosis not present

## 2023-02-03 DIAGNOSIS — I5032 Chronic diastolic (congestive) heart failure: Secondary | ICD-10-CM | POA: Diagnosis not present

## 2023-02-03 DIAGNOSIS — J449 Chronic obstructive pulmonary disease, unspecified: Secondary | ICD-10-CM | POA: Diagnosis not present

## 2023-02-03 DIAGNOSIS — L03116 Cellulitis of left lower limb: Secondary | ICD-10-CM | POA: Diagnosis not present

## 2023-02-04 NOTE — Telephone Encounter (Signed)
Updated NP about appointment for [facility].

## 2023-02-07 ENCOUNTER — Inpatient Hospital Stay (HOSPITAL_COMMUNITY)
Admission: EM | Admit: 2023-02-07 | Discharge: 2023-02-16 | DRG: 291 | Disposition: A | Discharge status: Hospice | Payer: Medicare Other | Attending: Internal Medicine | Admitting: Internal Medicine

## 2023-02-07 ENCOUNTER — Encounter (HOSPITAL_COMMUNITY): Payer: Self-pay

## 2023-02-07 ENCOUNTER — Emergency Department (HOSPITAL_COMMUNITY): Payer: Medicare Other

## 2023-02-07 ENCOUNTER — Telehealth: Payer: Self-pay

## 2023-02-07 ENCOUNTER — Other Ambulatory Visit: Payer: Self-pay

## 2023-02-07 ENCOUNTER — Ambulatory Visit: Payer: Medicare Other | Admitting: Podiatry

## 2023-02-07 DIAGNOSIS — J918 Pleural effusion in other conditions classified elsewhere: Secondary | ICD-10-CM | POA: Diagnosis not present

## 2023-02-07 DIAGNOSIS — R059 Cough, unspecified: Secondary | ICD-10-CM | POA: Diagnosis not present

## 2023-02-07 DIAGNOSIS — E785 Hyperlipidemia, unspecified: Secondary | ICD-10-CM | POA: Diagnosis present

## 2023-02-07 DIAGNOSIS — J811 Chronic pulmonary edema: Secondary | ICD-10-CM | POA: Diagnosis not present

## 2023-02-07 DIAGNOSIS — W1830XA Fall on same level, unspecified, initial encounter: Secondary | ICD-10-CM | POA: Diagnosis present

## 2023-02-07 DIAGNOSIS — E875 Hyperkalemia: Secondary | ICD-10-CM | POA: Diagnosis present

## 2023-02-07 DIAGNOSIS — J9601 Acute respiratory failure with hypoxia: Secondary | ICD-10-CM | POA: Diagnosis not present

## 2023-02-07 DIAGNOSIS — E042 Nontoxic multinodular goiter: Secondary | ICD-10-CM | POA: Diagnosis not present

## 2023-02-07 DIAGNOSIS — J90 Pleural effusion, not elsewhere classified: Secondary | ICD-10-CM | POA: Diagnosis not present

## 2023-02-07 DIAGNOSIS — I5031 Acute diastolic (congestive) heart failure: Secondary | ICD-10-CM | POA: Diagnosis not present

## 2023-02-07 DIAGNOSIS — Z66 Do not resuscitate: Secondary | ICD-10-CM | POA: Diagnosis present

## 2023-02-07 DIAGNOSIS — Z79899 Other long term (current) drug therapy: Secondary | ICD-10-CM | POA: Diagnosis not present

## 2023-02-07 DIAGNOSIS — L89151 Pressure ulcer of sacral region, stage 1: Secondary | ICD-10-CM | POA: Diagnosis not present

## 2023-02-07 DIAGNOSIS — R001 Bradycardia, unspecified: Secondary | ICD-10-CM | POA: Diagnosis present

## 2023-02-07 DIAGNOSIS — Z6831 Body mass index (BMI) 31.0-31.9, adult: Secondary | ICD-10-CM | POA: Diagnosis not present

## 2023-02-07 DIAGNOSIS — D638 Anemia in other chronic diseases classified elsewhere: Secondary | ICD-10-CM | POA: Diagnosis present

## 2023-02-07 DIAGNOSIS — I2510 Atherosclerotic heart disease of native coronary artery without angina pectoris: Secondary | ICD-10-CM | POA: Diagnosis present

## 2023-02-07 DIAGNOSIS — N179 Acute kidney failure, unspecified: Secondary | ICD-10-CM | POA: Diagnosis present

## 2023-02-07 DIAGNOSIS — R627 Adult failure to thrive: Secondary | ICD-10-CM | POA: Diagnosis present

## 2023-02-07 DIAGNOSIS — R339 Retention of urine, unspecified: Secondary | ICD-10-CM | POA: Diagnosis not present

## 2023-02-07 DIAGNOSIS — K219 Gastro-esophageal reflux disease without esophagitis: Secondary | ICD-10-CM | POA: Diagnosis present

## 2023-02-07 DIAGNOSIS — I082 Rheumatic disorders of both aortic and tricuspid valves: Secondary | ICD-10-CM | POA: Diagnosis present

## 2023-02-07 DIAGNOSIS — Z7951 Long term (current) use of inhaled steroids: Secondary | ICD-10-CM

## 2023-02-07 DIAGNOSIS — F064 Anxiety disorder due to known physiological condition: Secondary | ICD-10-CM

## 2023-02-07 DIAGNOSIS — Z515 Encounter for palliative care: Secondary | ICD-10-CM | POA: Diagnosis not present

## 2023-02-07 DIAGNOSIS — Z888 Allergy status to other drugs, medicaments and biological substances status: Secondary | ICD-10-CM

## 2023-02-07 DIAGNOSIS — N1832 Chronic kidney disease, stage 3b: Secondary | ICD-10-CM | POA: Diagnosis present

## 2023-02-07 DIAGNOSIS — Z634 Disappearance and death of family member: Secondary | ICD-10-CM

## 2023-02-07 DIAGNOSIS — I2585 Chronic coronary microvascular dysfunction: Secondary | ICD-10-CM | POA: Diagnosis present

## 2023-02-07 DIAGNOSIS — G8929 Other chronic pain: Secondary | ICD-10-CM | POA: Diagnosis present

## 2023-02-07 DIAGNOSIS — L8990 Pressure ulcer of unspecified site, unspecified stage: Secondary | ICD-10-CM

## 2023-02-07 DIAGNOSIS — I509 Heart failure, unspecified: Secondary | ICD-10-CM

## 2023-02-07 DIAGNOSIS — E871 Hypo-osmolality and hyponatremia: Secondary | ICD-10-CM | POA: Diagnosis present

## 2023-02-07 DIAGNOSIS — M4856XA Collapsed vertebra, not elsewhere classified, lumbar region, initial encounter for fracture: Secondary | ICD-10-CM | POA: Diagnosis not present

## 2023-02-07 DIAGNOSIS — Z882 Allergy status to sulfonamides status: Secondary | ICD-10-CM

## 2023-02-07 DIAGNOSIS — Z5986 Financial insecurity: Secondary | ICD-10-CM

## 2023-02-07 DIAGNOSIS — I252 Old myocardial infarction: Secondary | ICD-10-CM

## 2023-02-07 DIAGNOSIS — Z8249 Family history of ischemic heart disease and other diseases of the circulatory system: Secondary | ICD-10-CM

## 2023-02-07 DIAGNOSIS — I130 Hypertensive heart and chronic kidney disease with heart failure and stage 1 through stage 4 chronic kidney disease, or unspecified chronic kidney disease: Principal | ICD-10-CM | POA: Diagnosis present

## 2023-02-07 DIAGNOSIS — I4821 Permanent atrial fibrillation: Secondary | ICD-10-CM | POA: Diagnosis present

## 2023-02-07 DIAGNOSIS — Z87440 Personal history of urinary (tract) infections: Secondary | ICD-10-CM

## 2023-02-07 DIAGNOSIS — I5033 Acute on chronic diastolic (congestive) heart failure: Secondary | ICD-10-CM | POA: Insufficient documentation

## 2023-02-07 DIAGNOSIS — F419 Anxiety disorder, unspecified: Secondary | ICD-10-CM | POA: Diagnosis present

## 2023-02-07 DIAGNOSIS — Z7901 Long term (current) use of anticoagulants: Secondary | ICD-10-CM

## 2023-02-07 DIAGNOSIS — I272 Pulmonary hypertension, unspecified: Secondary | ICD-10-CM | POA: Diagnosis not present

## 2023-02-07 DIAGNOSIS — Z85038 Personal history of other malignant neoplasm of large intestine: Secondary | ICD-10-CM

## 2023-02-07 DIAGNOSIS — Z91041 Radiographic dye allergy status: Secondary | ICD-10-CM

## 2023-02-07 DIAGNOSIS — I4819 Other persistent atrial fibrillation: Secondary | ICD-10-CM | POA: Diagnosis not present

## 2023-02-07 DIAGNOSIS — J439 Emphysema, unspecified: Secondary | ICD-10-CM | POA: Diagnosis not present

## 2023-02-07 DIAGNOSIS — J9811 Atelectasis: Secondary | ICD-10-CM | POA: Diagnosis present

## 2023-02-07 DIAGNOSIS — M8580 Other specified disorders of bone density and structure, unspecified site: Secondary | ICD-10-CM | POA: Diagnosis present

## 2023-02-07 DIAGNOSIS — D631 Anemia in chronic kidney disease: Secondary | ICD-10-CM | POA: Diagnosis not present

## 2023-02-07 DIAGNOSIS — R0602 Shortness of breath: Secondary | ICD-10-CM | POA: Diagnosis not present

## 2023-02-07 DIAGNOSIS — S32050A Wedge compression fracture of fifth lumbar vertebra, initial encounter for closed fracture: Secondary | ICD-10-CM | POA: Diagnosis present

## 2023-02-07 DIAGNOSIS — R0682 Tachypnea, not elsewhere classified: Secondary | ICD-10-CM | POA: Diagnosis not present

## 2023-02-07 DIAGNOSIS — I110 Hypertensive heart disease with heart failure: Secondary | ICD-10-CM | POA: Diagnosis not present

## 2023-02-07 DIAGNOSIS — Z853 Personal history of malignant neoplasm of breast: Secondary | ICD-10-CM

## 2023-02-07 DIAGNOSIS — Z7189 Other specified counseling: Secondary | ICD-10-CM | POA: Diagnosis not present

## 2023-02-07 DIAGNOSIS — J449 Chronic obstructive pulmonary disease, unspecified: Secondary | ICD-10-CM | POA: Diagnosis not present

## 2023-02-07 DIAGNOSIS — I5023 Acute on chronic systolic (congestive) heart failure: Secondary | ICD-10-CM

## 2023-02-07 DIAGNOSIS — Z9049 Acquired absence of other specified parts of digestive tract: Secondary | ICD-10-CM

## 2023-02-07 DIAGNOSIS — R0600 Dyspnea, unspecified: Secondary | ICD-10-CM | POA: Diagnosis not present

## 2023-02-07 DIAGNOSIS — I10 Essential (primary) hypertension: Secondary | ICD-10-CM | POA: Diagnosis present

## 2023-02-07 DIAGNOSIS — K5792 Diverticulitis of intestine, part unspecified, without perforation or abscess without bleeding: Secondary | ICD-10-CM

## 2023-02-07 DIAGNOSIS — Z8673 Personal history of transient ischemic attack (TIA), and cerebral infarction without residual deficits: Secondary | ICD-10-CM

## 2023-02-07 LAB — CBC WITH DIFFERENTIAL/PLATELET
Abs Immature Granulocytes: 0.02 10*3/uL (ref 0.00–0.07)
Basophils Absolute: 0 10*3/uL (ref 0.0–0.1)
Basophils Relative: 0 %
Eosinophils Absolute: 0.1 10*3/uL (ref 0.0–0.5)
Eosinophils Relative: 2 %
HCT: 35 % — ABNORMAL LOW (ref 36.0–46.0)
Hemoglobin: 10.6 g/dL — ABNORMAL LOW (ref 12.0–15.0)
Immature Granulocytes: 0 %
Lymphocytes Relative: 29 %
Lymphs Abs: 1.9 10*3/uL (ref 0.7–4.0)
MCH: 30 pg (ref 26.0–34.0)
MCHC: 30.3 g/dL (ref 30.0–36.0)
MCV: 99.2 fL (ref 80.0–100.0)
Monocytes Absolute: 0.9 10*3/uL (ref 0.1–1.0)
Monocytes Relative: 13 %
Neutro Abs: 3.8 10*3/uL (ref 1.7–7.7)
Neutrophils Relative %: 56 %
Platelets: 223 10*3/uL (ref 150–400)
RBC: 3.53 MIL/uL — ABNORMAL LOW (ref 3.87–5.11)
RDW: 14.9 % (ref 11.5–15.5)
WBC: 6.8 10*3/uL (ref 4.0–10.5)
nRBC: 0 % (ref 0.0–0.2)

## 2023-02-07 LAB — COMPREHENSIVE METABOLIC PANEL
ALT: 23 U/L (ref 0–44)
AST: 35 U/L (ref 15–41)
Albumin: 3.1 g/dL — ABNORMAL LOW (ref 3.5–5.0)
Alkaline Phosphatase: 84 U/L (ref 38–126)
Anion gap: 6 (ref 5–15)
BUN: 22 mg/dL (ref 8–23)
CO2: 31 mmol/L (ref 22–32)
Calcium: 8.8 mg/dL — ABNORMAL LOW (ref 8.9–10.3)
Chloride: 94 mmol/L — ABNORMAL LOW (ref 98–111)
Creatinine, Ser: 1.17 mg/dL — ABNORMAL HIGH (ref 0.44–1.00)
GFR, Estimated: 45 mL/min — ABNORMAL LOW (ref >60)
Glucose, Bld: 134 mg/dL — ABNORMAL HIGH (ref 70–99)
Potassium: 4.1 mmol/L (ref 3.5–5.1)
Sodium: 131 mmol/L — ABNORMAL LOW (ref 135–145)
Total Bilirubin: 0.5 mg/dL (ref 0.3–1.2)
Total Protein: 6.1 g/dL — ABNORMAL LOW (ref 6.5–8.1)

## 2023-02-07 LAB — BRAIN NATRIURETIC PEPTIDE: B Natriuretic Peptide: 473.9 pg/mL — ABNORMAL HIGH (ref 0.0–100.0)

## 2023-02-07 LAB — MAGNESIUM: Magnesium: 1.9 mg/dL (ref 1.7–2.4)

## 2023-02-07 LAB — I-STAT VENOUS BLOOD GAS, ED
Acid-Base Excess: 4 mmol/L — ABNORMAL HIGH (ref 0.0–2.0)
Bicarbonate: 32.5 mmol/L — ABNORMAL HIGH (ref 20.0–28.0)
Calcium, Ion: 1.16 mmol/L (ref 1.15–1.40)
HCT: 34 % — ABNORMAL LOW (ref 36.0–46.0)
Hemoglobin: 11.6 g/dL — ABNORMAL LOW (ref 12.0–15.0)
O2 Saturation: 100 %
Potassium: 4.2 mmol/L (ref 3.5–5.1)
Sodium: 131 mmol/L — ABNORMAL LOW (ref 135–145)
TCO2: 35 mmol/L — ABNORMAL HIGH (ref 22–32)
pCO2, Ven: 66 mmHg — ABNORMAL HIGH (ref 44–60)
pH, Ven: 7.301 (ref 7.25–7.43)
pO2, Ven: 197 mmHg — ABNORMAL HIGH (ref 32–45)

## 2023-02-07 LAB — TROPONIN I (HIGH SENSITIVITY)
Troponin I (High Sensitivity): 78 ng/L — ABNORMAL HIGH (ref <18)
Troponin I (High Sensitivity): 67 ng/L — ABNORMAL HIGH (ref <18)

## 2023-02-07 MED ORDER — CLONIDINE HCL 0.2 MG PO TABS
0.2000 mg | ORAL_TABLET | Freq: Two times a day (BID) | ORAL | Status: DC
  Administered 2023-02-07 – 2023-02-16 (×18): 0.2 mg via ORAL
  Filled 2023-02-07 (×18): qty 1

## 2023-02-07 MED ORDER — FUROSEMIDE 10 MG/ML IJ SOLN
40.0000 mg | Freq: Once | INTRAMUSCULAR | Status: AC
  Administered 2023-02-07: 40 mg via INTRAVENOUS
  Filled 2023-02-07: qty 4

## 2023-02-07 MED ORDER — ONDANSETRON HCL 4 MG/2ML IJ SOLN
4.0000 mg | Freq: Four times a day (QID) | INTRAMUSCULAR | Status: DC | PRN
  Administered 2023-02-11: 4 mg via INTRAVENOUS
  Filled 2023-02-07: qty 2

## 2023-02-07 MED ORDER — ENOXAPARIN SODIUM 40 MG/0.4ML IJ SOSY
40.0000 mg | PREFILLED_SYRINGE | INTRAMUSCULAR | Status: DC
Start: 2023-02-07 — End: 2023-02-07

## 2023-02-07 MED ORDER — FAMOTIDINE 20 MG PO TABS: 20.0000 mg | ORAL_TABLET | Freq: Every evening | ORAL | Status: DC | PRN

## 2023-02-07 MED ORDER — SODIUM CHLORIDE 1 G PO TABS
2.0000 g | ORAL_TABLET | Freq: Two times a day (BID) | ORAL | Status: DC
  Administered 2023-02-08 – 2023-02-16 (×17): 2 g via ORAL
  Filled 2023-02-07 (×18): qty 2

## 2023-02-07 MED ORDER — AMLODIPINE BESYLATE 5 MG PO TABS
5.0000 mg | ORAL_TABLET | Freq: Every day | ORAL | Status: DC
  Administered 2023-02-08 – 2023-02-09 (×2): 5 mg via ORAL
  Filled 2023-02-07 (×2): qty 1

## 2023-02-07 MED ORDER — OXYCODONE HCL 5 MG PO TABS
5.0000 mg | ORAL_TABLET | ORAL | Status: DC | PRN
  Administered 2023-02-07 – 2023-02-14 (×21): 5 mg via ORAL
  Filled 2023-02-07 (×21): qty 1

## 2023-02-07 MED ORDER — ONDANSETRON 4 MG PO TBDP: 4.0000 mg | ORAL_TABLET | Freq: Three times a day (TID) | ORAL | Status: DC | PRN

## 2023-02-07 MED ORDER — ACETAMINOPHEN 325 MG PO TABS
650.0000 mg | ORAL_TABLET | Freq: Four times a day (QID) | ORAL | Status: DC | PRN
  Administered 2023-02-08 – 2023-02-13 (×5): 650 mg via ORAL
  Filled 2023-02-07 (×5): qty 2

## 2023-02-07 MED ORDER — ACETAMINOPHEN 650 MG RE SUPP: 650.0000 mg | Freq: Four times a day (QID) | RECTAL | Status: DC | PRN

## 2023-02-07 MED ORDER — APIXABAN 5 MG PO TABS
5.0000 mg | ORAL_TABLET | Freq: Two times a day (BID) | ORAL | Status: DC
  Administered 2023-02-07 – 2023-02-10 (×6): 5 mg via ORAL
  Filled 2023-02-07 (×6): qty 1

## 2023-02-07 MED ORDER — ONDANSETRON HCL 4 MG PO TABS: 4.0000 mg | ORAL_TABLET | Freq: Four times a day (QID) | ORAL | Status: DC | PRN

## 2023-02-07 MED ORDER — FUROSEMIDE 10 MG/ML IJ SOLN: 40.0000 mg | Freq: Once | INTRAMUSCULAR | Status: DC

## 2023-02-07 MED ORDER — DIPHENHYDRAMINE HCL 25 MG PO CAPS: 50.0000 mg | ORAL_CAPSULE | Freq: Once | ORAL | Status: AC

## 2023-02-07 MED ORDER — RANOLAZINE ER 500 MG PO TB12: 500.0000 mg | ORAL_TABLET | Freq: Every day | ORAL | Status: DC | PRN

## 2023-02-07 MED ORDER — TORSEMIDE 20 MG PO TABS: 20.0000 mg | ORAL_TABLET | Freq: Every day | ORAL | Status: DC

## 2023-02-07 MED ORDER — METHYLPREDNISOLONE SODIUM SUCC 40 MG IJ SOLR
40.0000 mg | Freq: Once | INTRAMUSCULAR | Status: AC
  Administered 2023-02-07: 40 mg via INTRAVENOUS
  Filled 2023-02-07: qty 1

## 2023-02-07 MED ORDER — PANTOPRAZOLE SODIUM 40 MG PO TBEC
40.0000 mg | DELAYED_RELEASE_TABLET | Freq: Every day | ORAL | Status: DC
  Administered 2023-02-07 – 2023-02-16 (×10): 40 mg via ORAL
  Filled 2023-02-07 (×10): qty 1

## 2023-02-07 MED ORDER — DIPHENHYDRAMINE HCL 50 MG/ML IJ SOLN
50.0000 mg | Freq: Once | INTRAMUSCULAR | Status: AC
  Administered 2023-02-08: 50 mg via INTRAVENOUS
  Filled 2023-02-07: qty 1

## 2023-02-07 MED ORDER — FUROSEMIDE 10 MG/ML IJ SOLN
40.0000 mg | Freq: Once | INTRAMUSCULAR | Status: AC
  Administered 2023-02-08: 40 mg via INTRAVENOUS
  Filled 2023-02-07: qty 4

## 2023-02-07 NOTE — H&P (Signed)
History and Physical    [name] UJW:[id number] DOB: May 15, 1935 DOA: 02/07/2023  PCP: Donita Brooks, MD   Chief Complaint: weight gain,swelling  HPI: [name] is a 88 y.o. female with medical history significant of aortic stenosis, heart failure with preserved ejection fraction, hypertension, hyperlipidemia, CAD, permanent A-fib, pulmonary hypertension, prior stroke who presented to the emergency department due to shortness of breath.  Patient states that over the last 1 month she has been having progressively worsening shortness of breath and cough. She has also gained 40lbs.  She states that she has difficulty laying flat as well as being unable to walk more than a couple meters without having to stop.  She called EMS due to mild respiratory distress.  They found her hypoxic in the 80s and brought her to the ER for further assessment.  On arrival she was bradycardic in the 50s and satting well on nasal cannula.  Labs were obtained which showed sodium 131, creatinine 1.1, WBC 6.8, hemoglobin 10.6, troponin 78, 67, BNP 473, chest x-ray showed cardiomegaly with pulmonary vascular congestion as well as opacities in the lungs and a left-sided pleural effusion.  Patient was admitted for further workup and started on IV diuresis.  On admission she was mild labored breathing on nasal cannula.  She denied any fever or chills however has had a productive cough.  She has bilateral swelling in her lower extremities and endorses weight gain. I had a discussion with family at bedside regarding GOC and family request DNR status without intubation.    Review of Systems: Review of Systems  All other systems reviewed and are negative.    As per HPI otherwise 10 point review of systems negative.   Allergies  Allergen Reactions   Contrast Media [Iodinated Contrast Media] Hives and Other (See Comments)    Chest pain and burning    Clonidine Derivatives Other (See Comments)    Dry mouth, throat    Statins Other (See Comments)    Myalgias   Sulfa Antibiotics Diarrhea    Tremors    Celebrex [Celecoxib] Rash   Isordil [Isosorbide Nitrate] Itching and Rash   Tape Itching and Rash    Redness at application site Reaction is to paper and plastic tapes and EKG leads    Past Medical History:  Diagnosis Date   Allergy    rhinitis   Aortic stenosis    mild by echo 06/2017   Arthritis    Bradycardia    a. 10/2017 -> beta blocker cut back due to HR 39.   Breast cancer (HCC) 01/06/2012   Cancer (HCC)    right colon and left breast   Chronic diastolic CHF (congestive heart failure) (HCC)    Colon cancer (HCC) 01/06/2012   Colovesical fistula    Dr. Carolynne Edouard and Dr. Laverle Patter planning surgery 08/2018- surgery revealed spontaneous closure   COPD (chronic obstructive pulmonary disease) (HCC)    pt. denies   Coronary artery disease 2006   a.  NSTEMI in 2016, cath showed 15% prox-mid RCA, 20% prox LAD, EF 25-35% by cath and 35-40% -> felt due to Takotsubo cardiomyopathy.   Dilated aortic root (HCC)    by echo 06/2017   Diverticulosis    Dyspnea    Edema extremities    GERD (gastroesophageal reflux disease)    Hernia    Hiatal hernia    denies   Hyperlipidemia    Hypertension    Mild aortic stenosis    echo  11/2015 but not noted on echo 06/2016   Osteopenia    Permanent atrial fibrillation (HCC)    chronic atrial fibrillation   Pneumonia    hx child   Pulmonary HTN (HCC)    a. moderate to severe PASP echo 11/2015 - now by echo 06/2017. CTA chest in 11/16 with no PE. PFTs in 7/15 with mild obstructive lung disease. She had a negative sleep study in 2017. b. Felt due to left sided HF.   Stroke [facility])    Takotsubo syndrome 07/29/2015   a. EF 35-40% by echo; akinesis of mid-apical anteroseptal and apical myocardium.  EF now normalized on echo 11/2015    Past Surgical History:  Procedure Laterality Date   APPENDECTOMY     BIOPSY  01/15/2023   Procedure: BIOPSY;  Surgeon:  Lynann Bologna, DO;  Location: [facility] ENDOSCOPY;  Service: Gastroenterology;;   BREAST SURGERY     lumpectomy left   CARDIAC CATHETERIZATION     CARDIAC CATHETERIZATION N/A 07/28/2015   Procedure: Left Heart Cath and Coronary Angiography;  Surgeon: Peter M Swazil[place], MD;  Location: [facility] INVASIVE CV LAB;  Service: Cardiovascular;  Laterality: N/A;   CHOLECYSTECTOMY     COLECTOMY     right side   CYSTOSCOPY WITH STENT PLACEMENT Left 09/04/2018   Procedure: CYSTOSCOPY WITH LEFT STENT PLACEMENT, BLADDER REPAIR, CYSTOSCOPY WITH LEFT STENT REMOVAL;  Surgeon: Heloise Purpura, MD;  Location: [facility] ORS;  Service: Urology;  Laterality: Left;   ENTEROSCOPY N/A 01/16/2023   Procedure: ENTEROSCOPY;  Surgeon: Lynann Bologna, DO;  Location: [facility];  Service: Gastroenterology;  Laterality: N/A;   ESOPHAGOGASTRODUODENOSCOPY (EGD) WITH PROPOFOL N/A 01/15/2023   Procedure: ESOPHAGOGASTRODUODENOSCOPY (EGD) WITH PROPOFOL;  Surgeon: Lynann Bologna, DO;  Location: [facility];  Service: Gastroenterology;  Laterality: N/A;   EXCISION OF ACCESSORY NIPPLE Bilateral 05/30/2013   Procedure: BILATERAL NIPPLE BIOPSY;  Surgeon: Robyne Askew, MD;  Location: [facility] OR;  Service: General;  Laterality: Bilateral;   EYE SURGERY Bilateral 12   cataracts   HOT HEMOSTASIS N/A 01/15/2023   Procedure: HOT HEMOSTASIS (ARGON PLASMA COAGULATION/BICAP);  Surgeon: Lynann Bologna, DO;  Location: [facility];  Service: Gastroenterology;  Laterality: N/A;   HYSTEROSCOPY WITH D & C N/A 05/29/2020   Procedure: DILATATION AND CURETTAGE /HYSTEROSCOPY;  Surgeon: Natale Milch, MD;  Location: [facility] ORS;  Service: Gynecology;  Laterality: N/A;   IR KYPHO EA ADDL LEVEL THORACIC OR LUMBAR  11/22/2022   IR KYPHO LUMBAR INC FX REDUCE BONE BX UNI/BIL CANNULATION INC/IMAGING  11/22/2022   IR RADIOLOGIST EVAL & MGMT  07/26/2017   LAPAROSCOPIC RIGHT COLECTOMY N/A 09/04/2018   Procedure: LAPAROSCOPIC ASSISTED SIGMOID COLECTOMY WITH REPAIR OF  FISTULA TO BLADDER;  Surgeon: Griselda Miner, MD;  Location: [facility] ORS;  Service: General;  Laterality: N/A;   RIGHT/LEFT HEART CATH AND CORONARY ANGIOGRAPHY N/A 12/10/2020   Procedure: RIGHT/LEFT HEART CATH AND CORONARY ANGIOGRAPHY;  Surgeon: Laurey Morale, MD;  Location[facility] INVASIVE CV LAB;  Service: Cardiovascular;  Laterality: N/A;   SPLIT NIGHT STUDY  02/02/2016         reports that she has never smoked. She has never been exposed to tobacco smoke. She has never used smokeless tobacco. She reports that she does not drink alcohol and does not use drugs.  Family History  Problem Relation Age of Onset   Heart disease Mother    Heart attack Mother    Cancer Sister  stomach and colon   Heart disease Brother 38   Hypertension Father    Cancer Sister    Stroke Neg Hx     Prior to Admission medications   Medication Sig Start Date End Date Taking? Authorizing Provider  acetaminophen (TYLENOL) 325 MG tablet Take 2 tablets (650 mg total) by mouth every 6 (six) hours as needed for moderate pain. 01/17/23  Yes [place], Eric J, DO  albuterol (VENTOLIN HFA) 108 (90 Base) MCG/ACT inhaler Inhale 2 puffs into the lungs every 6 (six) hours as needed for wheezing or shortness of breath. 10/27/22  Yes Dimas Aguas, [facility], FNP  amLODipine (NORVASC) 5 MG tablet Take 5 mg by mouth daily.   Yes [provider]  apixaban (ELIQUIS) 5 MG TABS tablet Take 1 tablet (5 mg total) by mouth 2 (two) times daily. 11/24/22  Yes Almon Hercules, MD  bisacodyl (DULCOLAX) 10 MG suppository Place 1 suppository (10 mg total) rectally as needed for moderate constipation. 12/24/22  Yes Donita Brooks, MD  budesonide-formoterol (SYMBICORT) 80-4.5 MCG/ACT inhaler Inhale 2 puffs into the lungs in the morning and at bedtime. Patient taking differently: Inhale 2 puffs into the lungs See admin instructions. 2 puffs every morning, Sark[place]yan take an additional 2 puffs later in the day if needed for wheezing, shortness of breath.  11/15/22  Yes Dgayli, Lianne Bushy, MD  clonazePAM (KLONOPIN) 0.5 MG tablet Take 1 tablet (0.5 mg total) by mouth 2 (two) times daily as needed. for anxiety Patient taking differently: Take 0.5 mg by mouth at bedtime as needed for anxiety. for anxiety 01/17/23  Yes [place], Alvira Philips, DO  cloNIDine (CATAPRES) 0.2 MG tablet Take 1 tablet (0.2 mg total) by mouth 2 (two) times daily. 01/04/23  Yes Milford, Anderson Malta, FNP  doxycycline (MONODOX) 100 MG capsule Take 100 mg by mouth 2 (two) times daily.   Yes [provider]  famotidine (PEPCID) 20 MG tablet Take 20 mg by mouth daily as needed for heartburn.   Yes [provider]  ferrous sulfate 325 (65 FE) MG tablet Take 1 tablet (325 mg total) by mouth daily with breakfast. 07/04/20  Yes Donita Brooks, MD  fluticasone (FLONASE) 50 MCG/ACT nasal spray Place 1 spray into both nostrils daily as needed for allergies or rhinitis.   Yes [provider]  methocarbamol (ROBAXIN) 500 MG tablet Take 1 tablet (500 mg total) by mouth every 6 (six) hours as needed for muscle spasms. 01/06/23  Yes Donita Brooks, MD  nitroGLYCERIN (NITROSTAT) 0.4 MG SL tablet DISSOLVE ONE TABLET UNDER THE TONGUE EVERY 5 MINUTES AS NEEDED FOR CHEST PAIN.  DO NOT EXCEED A TOTAL OF 3 DOSES IN 15 MINUTES Patient taking differently: Place 0.4 mg under the tongue See admin instructions. DISSOLVE ONE TABLET UNDER THE TONGUE EVERY 5 MINUTES AS NEEDED FOR CHEST PAIN.  DO NOT EXCEED A TOTAL OF 3 DOSES IN 15 MINUTES 11/06/21  Yes Turner, Cornelious Bryant, MD  ondansetron (ZOFRAN-ODT) 4 MG disintegrating tablet Take 1 tablet (4 mg total) by mouth every 8 (eight) hours as needed for nausea or vomiting. 12/17/22  Yes Donita Brooks, MD  oxyCODONE-acetaminophen (PERCOCET/ROXICET) 5-325 MG tablet Take 1 tablet by mouth every 6 (six) hours as needed for moderate pain. 01/17/23  Yes [place], Eric J, DO  polyethylene glycol powder (MIRALAX) 17 GM/SCOOP powder Take 17 g by mouth 2 (two) times  daily as needed for mild constipation. 11/24/22  Yes Almon Hercules, MD  ranolazine (RANEXA) 500  MG 12 hr tablet Take 500 mg by mouth daily as needed (chest pain).   Yes [provider]  senna-docusate (SENOKOT-S) 8.6-50 MG tablet Take 2 tablets by mouth 2 (two) times daily between meals as needed for moderate constipation. Patient taking differently: Take 2 tablets by mouth every 12 (twelve) hours as needed for moderate constipation. 11/24/22  Yes Almon Hercules, MD  sodium chloride 1 g tablet Take 2 tablets (2 g total) by mouth 2 (two) times daily with a meal. 01/17/23 02/16/23 Yes [place], Eric J, DO  torsemide (DEMADEX) 20 MG tablet Take 1 tablet (20 mg total) by mouth daily. 01/17/23 02/16/23 Yes [place], Eric J, DO  Torsemide 40 MG TABS Take 40 mg by mouth daily.   Yes [provider]  Vitamin D, Ergocalciferol, (DRISDOL) 1.25 MG (50000 UNIT) CAPS capsule Take 1 capsule (50,000 Units total) by mouth every 7 (seven) days. 01/17/23  Yes [place], Eric J, DO  pantoprazole (PROTONIX) 40 MG tablet Take 1 tablet (40 mg total) by mouth daily. Patient not taking: Reported on 02/07/2023 01/17/23   [place], Eric J, [place]    Physical Exam: Vitals:   02/07/23 1745 02/07/23 1800 02/07/23 1815 02/07/23 1830  BP: (!) 138/43 (!) 163/49 (!) 155/50 (!) 164/58  Pulse: (!) 56 (!) 58 (!) 52 (!) 54  Resp: 16 20 18 18   Temp:      TempSrc:      SpO2: 100% 99% 99% 98%  Weight:      Height:       Physical Exam Constitutional:      Appearance: She is normal weight.  HENT:     Head: Normocephalic.  Eyes:     Pupils: Pupils are equal, round, and reactive to light.  Cardiovascular:     Rate and Rhythm: Normal rate and regular rhythm.  Pulmonary:     Effort: Pulmonary effort is normal. Tachypnea present.  Abdominal:     Palpations: Abdomen is soft.  Musculoskeletal:        General: Normal range of motion.     Cervical back: Normal range of motion.     Right lower leg: Edema present.     Left lower  leg: Edema present.  Skin:    General: Skin is warm.     Capillary Refill: Capillary refill takes less than 2 seconds.  Neurological:     General: No focal deficit present.     Mental Status: She is alert.  Psychiatric:        Mood and Affect: Mood normal.     Labs on Admission: I have personally reviewed the patients's labs and imaging studies.  Assessment/Plan Principal Problem:   Acute exacerbation of CHF (congestive heart failure) (HCC)   # Acute on chronic heart failure with preserved ejection fraction exacerbation, POA, active #Concern for Pulm embolism - Patient had echocardiogram in July 2023 which demonstrated EF 60 to 65% with elevated pulmonary hypertension.  There was evidence of diastolic dysfunction on prior echocardiograms. Continue IV diuresis with Lasix  Plan: Redose 80mg  IV lasix Concern remains for Pe as patient is reportedly immobile at baseline albeit she is on anticoagulation. Will obtain CTA pe study to further assess Check labs in the morning Obtain echocardiogram  # Permanent A-fib-continue Eliquis  #CAD- continue Ranexa  #Hypertension- continue clonidine, amlodipine  #GERD- continue Pepcid, Protonix  #Chronic pain-continue as needed oxycodone  #Chronic hyponatremia-continue sodium chloride supplementation    Admission status: Observation Telemetry Cardiac  Certification: The appropriate patient  status for this patient is OBSERVATION. Observation status is judged to be reasonable and necessary in order to provide the required intensity of service to ensure the patient's safety. The patient's presenting symptoms, physical exam findings, and initial radiographic and laboratory data in the context of their medical condition is felt to place them at decreased risk for further clinical deterioration. Furthermore, it is anticipated that the patient will be medically stable for discharge from the hospital within 2 midnights of admission.     Alan Mulder MD [facility] If 7PM-7AM, please contact night-coverage w start with 40 mg IV Lasix.  Patient takes 20 mg of torsemide at home so [email or web address]  02/07/2023, 8:37 PM

## 2023-02-07 NOTE — ED Notes (Signed)
No urine collection noted in suction container. Pt reported she has not had the urge to urinate yet.

## 2023-02-07 NOTE — ED Provider Notes (Signed)
[place] EMERGENCY DEPARTMENT AT [facility] Provider Note  CSN: [id number] Arrival date & time: 02/07/23 1631  Chief Complaint(s) Shortness of Breath  HPI [name] is a 88 y.o. female with history of breast cancer, COPD, CHF, coronary artery disease, atrial fibrillation on Eliquis presenting to the emergency department shortness of breath.  Patient reports that she has had shortness of breath for the last 3 weeks which is slowly gotten worse.  She reports cough with some whitish phlegm.  She also reports leg swelling.  No chest pain, abdominal pain.  No lightheadedness or dizziness.  No fevers or chills.  She reports compliance with home medications.  Today called EMS due to feeling short of breath.  They found that she was in the 80s on room air.   Past Medical History Past Medical History:  Diagnosis Date   Allergy    rhinitis   Aortic stenosis    mild by echo 06/2017   Arthritis    Bradycardia    a. 10/2017 -> beta blocker cut back due to HR 39.   Breast cancer (HCC) 01/06/2012   Cancer (HCC)    right colon and left breast   Chronic diastolic CHF (congestive heart failure) (HCC)    Colon cancer (HCC) 01/06/2012   Colovesical fistula    Dr. Carolynne Edouard and Dr. Laverle Patter planning surgery 08/2018- surgery revealed spontaneous closure   COPD (chronic obstructive pulmonary disease) (HCC)    pt. denies   Coronary artery disease 2006   a.  NSTEMI in 2016, cath showed 15% prox-mid RCA, 20% prox LAD, EF 25-35% by cath and 35-40% -> felt due to Takotsubo cardiomyopathy.   Dilated aortic root (HCC)    by echo 06/2017   Diverticulosis    Dyspnea    Edema extremities    GERD (gastroesophageal reflux disease)    Hernia    Hiatal hernia    denies   Hyperlipidemia    Hypertension    Mild aortic stenosis    echo 11/2015 but not noted on echo 06/2016   Osteopenia    Permanent atrial fibrillation (HCC)    chronic atrial fibrillation   Pneumonia    hx child   Pulmonary HTN  (HCC)    a. moderate to severe PASP echo 11/2015 - now by echo 06/2017. CTA chest in 11/16 with no PE. PFTs in 7/15 with mild obstructive lung disease. She had a negative sleep study in 2017. b. Felt due to left sided HF.   Stroke [facility])    Takotsubo syndrome 07/29/2015   a. EF 35-40% by echo; akinesis of mid-apical anteroseptal and apical myocardium.  EF now normalized on echo 11/2015   Patient Active Problem List   Diagnosis Date Noted   Acute exacerbation of CHF (congestive heart failure) (HCC) 02/07/2023   Lumbar compression fracture, closed, initial encounter (HCC) 01/13/2023   Hyperkalemia 01/11/2023   Acute blood loss anemia 01/11/2023   Left ankle pain 01/10/2023   Closed disp comminuted fracture of shaft of right fibula with malunion 01/09/2023   Fall 01/09/2023   GAD (generalized anxiety disorder) 01/09/2023   Anemia of chronic disease 01/09/2023   Closed fracture of head of right fibula 01/09/2023   Closed right fibular fracture 01/09/2023   Vitamin D deficiency 01/09/2023   Closed compression fracture of L5 lumbar vertebra, initial encounter (HCC) 01/08/2023   Hyponatremia 11/18/2022   Lower back pain 11/18/2022   Normocytic anemia 11/18/2022   Closed compression fracture of  L3 lumbar vertebra, initial encounter (HCC) 11/17/2022   Acute bacterial sinusitis 10/27/2022   Abnormal results on imaging study of genitourinary system 03/25/2022   NSTEMI (non-ST elevated myocardial infarction) (HCC)    Persistent atrial fibrillation (HCC) 03/15/2022   Chronic kidney disease, stage 3b (HCC) 03/15/2022   Elevated troponin 03/15/2022   Postmenopausal bleeding 06/05/2020   Chest pain 07/23/2019   Diverticulitis large intestine w/o perforation or abscess w/o bleeding 09/04/2018   Colovesical fistula    Preoperative clearance 08/07/2018   TIA (transient ischemic attack) 01/06/2018   Breast cancer (HCC) 09/13/2017   Obesity, unspecified 09/13/2017   Warfarin  anticoagulation 09/13/2017   Uterine mass 09/07/2017   Adnexal mass 08/03/2017   Pelvic abscess in female 07/07/2017   Aortic stenosis    Dyslipidemia, goal LDL below 70 11/28/2016   Monitoring for long-term anticoagulant use 12/31/2015   Chronic diastolic CHF (congestive heart failure) (HCC) 10/28/2015   Insomnia 10/23/2015   Leg swelling 08/11/2015   Takotsubo syndrome 07/29/2015   DOE (dyspnea on exertion) 08/15/2014   Dilated aortic root (HCC)    Pulmonary HTN (HCC)    Permanent atrial fibrillation (HCC) 07/04/2013   Chronic anticoagulation 07/04/2013   Coronary artery disease    Lesion of nipple 05/15/2013   GERD (gastroesophageal reflux disease)    Essential hypertension    COPD (chronic obstructive pulmonary disease) (HCC)    Osteopenia    Diverticulosis    Hiatal hernia    Abdominal pain 01/11/2012   Breast cancer of upper-outer quadrant of left female breast (HCC) 01/06/2012   Colon cancer (HCC) 01/06/2012   Home Medication(s) Prior to Admission medications   Medication Sig Start Date End Date Taking? Authorizing Provider  acetaminophen (TYLENOL) 325 MG tablet Take 2 tablets (650 mg total) by mouth every 6 (six) hours as needed for moderate pain. 01/17/23   [place], Alvira Philips, DO  albuterol (VENTOLIN HFA) 108 (90 Base) MCG/ACT inhaler Inhale 2 puffs into the lungs every 6 (six) hours as needed for wheezing or shortness of breath. 10/27/22   Park Meo, FNP  amLODipine (NORVASC) 5 MG tablet Take 5 mg by mouth daily.    [provider]  apixaban (ELIQUIS) 5 MG TABS tablet Take 1 tablet (5 mg total) by mouth 2 (two) times daily. 11/24/22   Almon Hercules, MD  bisacodyl (DULCOLAX) 10 MG suppository Place 1 suppository (10 mg total) rectally as needed for moderate constipation. 12/24/22   Donita Brooks, MD  budesonide-formoterol (SYMBICORT) 80-4.5 MCG/ACT inhaler Inhale 2 puffs into the lungs in the morning and at bedtime. Patient taking differently: Inhale 2 puffs  into the lungs See admin instructions. 2 puffs every morning, [place] take an additional 2 puffs later in the day if needed for wheezing, shortness of breath. 11/15/22   Raechel Chute, MD  clonazePAM (KLONOPIN) 0.5 MG tablet Take 1 tablet (0.5 mg total) by mouth 2 (two) times daily as needed. for anxiety 01/17/23   [place], Alvira Philips, DO  cloNIDine (CATAPRES) 0.2 MG tablet Take 1 tablet (0.2 mg total) by mouth 2 (two) times daily. 01/04/23   [place], Anderson Malta, FNP  famotidine (PEPCID) 20 MG tablet Take 20 mg by mouth at bedtime as needed for heartburn.    [provider]  ferrous sulfate 325 (65 FE) MG tablet Take 1 tablet (325 mg total) by mouth daily with breakfast. 07/04/20   Donita Brooks, MD  fluticasone (FLONASE) 50 MCG/ACT nasal spray Place 1 spray into both nostrils  daily as needed for allergies or rhinitis.    [provider]  methocarbamol (ROBAXIN) 500 MG tablet Take 1 tablet (500 mg total) by mouth every 6 (six) hours as needed for muscle spasms. 01/06/23   Donita Brooks, MD  nitroGLYCERIN (NITROSTAT) 0.4 MG SL tablet DISSOLVE ONE TABLET UNDER THE TONGUE EVERY 5 MINUTES AS NEEDED FOR CHEST PAIN.  DO NOT EXCEED A TOTAL OF 3 DOSES IN 15 MINUTES 11/06/21   Turner, Cornelious Bryant, MD  ondansetron (ZOFRAN-ODT) 4 MG disintegrating tablet Take 1 tablet (4 mg total) by mouth every 8 (eight) hours as needed for nausea or vomiting. 12/17/22   Donita Brooks, MD  oxyCODONE-acetaminophen (PERCOCET/ROXICET) 5-325 MG tablet Take 1 tablet by mouth every 6 (six) hours as needed for moderate pain. 01/17/23   [place], Eric J, DO  pantoprazole (PROTONIX) 40 MG tablet Take 1 tablet (40 mg total) by mouth daily. 01/17/23   [place], Alvira Philips, DO  polyethylene glycol powder (MIRALAX) 17 GM/SCOOP powder Take 17 g by mouth 2 (two) times daily as needed for mild constipation. 11/24/22   Almon Hercules, MD  ranolazine (RANEXA) 500 MG 12 hr tablet Take 500 mg by mouth daily as needed (chest pain).    [provider]  senna-docusate (SENOKOT-S) 8.6-50 MG tablet Take 2 tablets by mouth 2 (two) times daily between meals as needed for moderate constipation. 11/24/22   Almon Hercules, MD  sodium chloride 1 g tablet Take 2 tablets (2 g total) by mouth 2 (two) times daily with a meal. 01/17/23 02/16/23  [place], Alvira Philips, DO  torsemide (DEMADEX) 20 MG tablet Take 1 tablet (20 mg total) by mouth daily. 01/17/23 02/16/23  [place], Eric J, DO  Vitamin D, Ergocalciferol, (DRISDOL) 1.25 MG (50000 UNIT) CAPS capsule Take 1 capsule (50,000 Units total) by mouth every 7 (seven) days. 01/17/23   [place], Eric J, DO                                                                                                                                    Past Surgical History Past Surgical History:  Procedure Laterality Date   APPENDECTOMY     BIOPSY  01/15/2023   Procedure: BIOPSY;  Surgeon: Lynann Bologna, DO;  Location: [facility] ENDOSCOPY;  Service: Gastroenterology;;   BREAST SURGERY     lumpectomy left   CARDIAC CATHETERIZATION     CARDIAC CATHETERIZATION N/A 07/28/2015   Procedure: Left Heart Cath and Coronary Angiography;  Surgeon: Peter M [place]az[place], MD;  Location: [facility] INVASIVE CV LAB;  Service: Cardiovascular;  Laterality: N/A;   CHOLECYSTECTOMY     COLECTOMY     right side   CYSTOSCOPY WITH STENT PLACEMENT Left 09/04/2018   Procedure: CYSTOSCOPY WITH LEFT STENT PLACEMENT, BLADDER REPAIR, CYSTOSCOPY WITH LEFT STENT REMOVAL;  Surgeon: Heloise Purpura, MD;  Location: [facility] ORS;  Service: Urology;  Laterality: Left;   ENTEROSCOPY N/A  01/16/2023   Procedure: ENTEROSCOPY;  Surgeon: Lynann Bologna, DO;  Location: [facility] ENDOSCOPY;  Service: Gastroenterology;  Laterality: N/A;   ESOPHAGOGASTRODUODENOSCOPY (EGD) WITH PROPOFOL N/A 01/15/2023   Procedure: ESOPHAGOGASTRODUODENOSCOPY (EGD) WITH PROPOFOL;  Surgeon: Lynann Bologna, DO;  Location: [facility] ENDOSCOPY;  Service: Gastroenterology;  Laterality: N/A;   EXCISION OF ACCESSORY NIPPLE  Bilateral 05/30/2013   Procedure: BILATERAL NIPPLE BIOPSY;  Surgeon: Robyne Askew, MD;  Location: [facility] OR;  Service: General;  Laterality: Bilateral;   EYE SURGERY Bilateral 12   cataracts   HOT HEMOSTASIS N/A 01/15/2023   Procedure: HOT HEMOSTASIS (ARGON PLASMA COAGULATION/BICAP);  Surgeon: Lynann Bologna, DO;  Location: [facility];  Service: Gastroenterology;  Laterality: N/A;   HYSTEROSCOPY WITH D & C N/A 05/29/2020   Procedure: DILATATION AND CURETTAGE /HYSTEROSCOPY;  Surgeon: Natale Milch, MD;  Location: [facility] ORS;  Service: Gynecology;  Laterality: N/A;   IR KYPHO EA ADDL LEVEL THORACIC OR LUMBAR  11/22/2022   IR KYPHO LUMBAR INC FX REDUCE BONE BX UNI/BIL CANNULATION INC/IMAGING  11/22/2022   IR RADIOLOGIST EVAL & MGMT  07/26/2017   LAPAROSCOPIC RIGHT COLECTOMY N/A 09/04/2018   Procedure: LAPAROSCOPIC ASSISTED SIGMOID COLECTOMY WITH REPAIR OF FISTULA TO BLADDER;  Surgeon: Griselda Miner, MD;  Location: [facility] ORS;  Service: General;  Laterality: N/A;   RIGHT/LEFT HEART CATH AND CORONARY ANGIOGRAPHY N/A 12/10/2020   Procedure: RIGHT/LEFT HEART CATH AND CORONARY ANGIOGRAPHY;  Surgeon: Laurey Morale, MD;  Location: [facility] INVASIVE CV LAB;  Service: Cardiovascular;  Laterality: N/A;   SPLIT NIGHT STUDY  02/02/2016       Family History Family History  Problem Relation Age of Onset   Heart disease Mother    Heart attack Mother    Cancer Sister        stomach and colon   Heart disease Brother 25   Hypertension Father    Cancer Sister    Stroke Neg Hx     Social History Social History   Tobacco Use   Smoking status: Never    Passive exposure: Never   Smokeless tobacco: Never  Vaping Use   Vaping Use: Never used  Substance Use Topics   Alcohol use: No   Drug use: No   Allergies Contrast media [iodinated contrast media], Clonidine derivatives, Statins, Sulfa antibiotics, Celebrex [celecoxib], Isordil [isosorbide nitrate], and Tape  Review of Systems Review of Systems   All other systems reviewed and are negative.   Physical Exam Vital Signs  I have reviewed the triage vital signs BP (!) 164/58   Pulse (!) 54   Temp 99.5 F (37.5 C) (Rectal)   Resp 18   Ht 5\' 5"  (1.651 m)   Wt 85.6 kg   SpO2 98%   BMI 31.40 kg/m  Physical Exam Vitals and nursing note reviewed.  Constitutional:      General: She is not in acute distress.    Appearance: She is well-developed.  HENT:     Head: Normocephalic and atraumatic.     Mouth/Throat:     Mouth: Mucous membranes are moist.  Eyes:     Pupils: Pupils are equal, round, and reactive to light.  Neck:     Vascular: JVD present.  Cardiovascular:     Rate and Rhythm: Normal rate and regular rhythm.     Heart sounds: No murmur heard. Pulmonary:     Effort: Tachypnea and accessory muscle usage present.     Breath sounds: Examination of the right-lower field reveals  rales. Examination of the left-lower field reveals rales. Rales present.  Abdominal:     General: Abdomen is flat.     Palpations: Abdomen is soft.     Tenderness: There is no abdominal tenderness.  Musculoskeletal:        General: No tenderness.     Right lower leg: Edema present.     Left lower leg: Edema present.  Skin:    General: Skin is warm and dry.  Neurological:     General: No focal deficit present.     Mental Status: She is alert. Mental status is at baseline.  Psychiatric:        Mood and Affect: Mood normal.        Behavior: Behavior normal.     ED Results and Treatments Labs (all labs ordered are listed, but only abnormal results are displayed) Labs Reviewed  COMPREHENSIVE METABOLIC PANEL - Abnormal; Notable for the following components:      Result Value   Sodium 131 (*)    Chloride 94 (*)    Glucose, Bld 134 (*)    Creatinine, Ser 1.17 (*)    Calcium 8.8 (*)    Total Protein 6.1 (*)    Albumin 3.1 (*)    GFR, Estimated 45 (*)    All other components within normal limits  CBC WITH DIFFERENTIAL/PLATELET -  Abnormal; Notable for the following components:   RBC 3.53 (*)    Hemoglobin 10.6 (*)    HCT 35.0 (*)    All other components within normal limits  I-STAT VENOUS BLOOD GAS, ED - Abnormal; Notable for the following components:   pCO2, Ven 66.0 (*)    pO2, Ven 197 (*)    Bicarbonate 32.5 (*)    TCO2 35 (*)    Acid-Base Excess 4.0 (*)    Sodium 131 (*)    HCT 34.0 (*)    Hemoglobin 11.6 (*)    All other components within normal limits  TROPONIN I (HIGH SENSITIVITY) - Abnormal; Notable for the following components:   Troponin I (High Sensitivity) 78 (*)    All other components within normal limits  TROPONIN I (HIGH SENSITIVITY) - Abnormal; Notable for the following components:   Troponin I (High Sensitivity) 67 (*)    All other components within normal limits  MAGNESIUM  PROTIME-INR  BRAIN NATRIURETIC PEPTIDE                                                                                                                          Radiology DG Chest Port 1 View  Result Date: 02/07/2023 CLINICAL DATA:  Cough EXAM: PORTABLE CHEST 1 VIEW COMPARISON:  Chest x-ray 11/18/2022 FINDINGS: Patient is rotated. Heart is enlarged. There central pulmonary vascular congestion with additional interstitial opacities in the left lower lung. There is a small left pleural effusion. There is no pneumothorax or acute fracture. IMPRESSION: 1. Cardiomegaly with central pulmonary vascular congestion. 2. Interstitial opacities in the left lower lung  [place] reflect asymmetric edema or infection. 3. Small left pleural effusion. Electronically Signed   By: Darliss Cheney M.D.   On: 02/07/2023 17:20    Pertinent labs & imaging results that were available during my care of the patient were reviewed by me and considered in my medical decision making (see MDM for details).  Medications Ordered in ED Medications  amLODipine (NORVASC) tablet 5 mg (has no administration in time range)  cloNIDine (CATAPRES) tablet 0.2 mg (has no  administration in time range)  ranolazine (RANEXA) 12 hr tablet 500 mg (has no administration in time range)  famotidine (PEPCID) tablet 20 mg (has no administration in time range)  ondansetron (ZOFRAN-ODT) disintegrating tablet 4 mg (has no administration in time range)  pantoprazole (PROTONIX) EC tablet 40 mg (has no administration in time range)  apixaban (ELIQUIS) tablet 5 mg (has no administration in time range)  sodium chloride tablet 2 g (has no administration in time range)  acetaminophen (TYLENOL) tablet 650 mg (has no administration in time range)    Or  acetaminophen (TYLENOL) suppository 650 mg (has no administration in time range)  oxyCODONE (Oxy IR/ROXICODONE) immediate release tablet 5 mg (has no administration in time range)  ondansetron (ZOFRAN) tablet 4 mg (has no administration in time range)    Or  ondansetron (ZOFRAN) injection 4 mg (has no administration in time range)  furosemide (LASIX) injection 40 mg (40 mg Intravenous Given 02/07/23 1659)  furosemide (LASIX) injection 40 mg (40 mg Intravenous Given 02/07/23 1901)                                                                                                                                     Procedures .Critical Care  Performed by: Lonell Grandchild, MD Authorized by: Lonell Grandchild, MD   Critical care provider statement:    Critical care time (minutes):  30   Critical care was necessary to treat or prevent imminent or life-threatening deterioration of the following conditions:  Cardiac failure and respiratory failure   Critical care was time spent personally by me on the following activities:  Development of treatment plan with patient or surrogate, discussions with consultants, evaluation of patient's response to treatment, examination of patient, ordering and review of laboratory studies, ordering and review of radiographic studies, ordering and performing treatments and interventions, pulse oximetry,  re-evaluation of patient's condition and review of old charts   Care discussed with: admitting provider     (including critical care time)  Medical Decision Making / ED Course   MDM:  88 year old female presenting to the emergency department shortness of breath.  Patient hypoxic to 80% on room air.  Patient also hypoxic to 80% on room air here.  Now on 4 L nasal cannula.  Does not typically use oxygen.  Physical exam concerning for CHF exacerbation.  Patient does have known history of CHF and reports compliance with her home diuretics.  Less likely pneumonia without fevers, will check chest x-ray given cough.  Doubt pulmonary embolism, patient compliant with home Eliquis and denies chest pain, has no tachycardia.  Less likely COPD, no wheezing.  Doubt pneumothorax will check chest x-ray.  Patient also recently hospitalized due to a fall.  With hypoxia patient will need to be admitted. Will give IV lasix  Clinical Course as of 02/07/23 Reva Bores Feb 07, 2023  1911 Discussed with the hospitalist who will admit patient for CHF exacerbation. Gave additional dose of lasix as patient not urinated yet.  [WS]  1955 Patient admitted to hospitalist service [WS]    Clinical Course User Index [WS] Lonell Grandchild, MD     Additional history obtained: -Additional history obtained from ems -External records from outside source obtained and reviewed including: Chart review including previous notes, labs, imaging, consultation notes including d/c from recent hospitalization   Lab Tests: -I ordered, reviewed, and interpreted labs.   The pertinent results include:   Labs Reviewed  COMPREHENSIVE METABOLIC PANEL - Abnormal; Notable for the following components:      Result Value   Sodium 131 (*)    Chloride 94 (*)    Glucose, Bld 134 (*)    Creatinine, Ser 1.17 (*)    Calcium 8.8 (*)    Total Protein 6.1 (*)    Albumin 3.1 (*)    GFR, Estimated 45 (*)    All other components within normal  limits  CBC WITH DIFFERENTIAL/PLATELET - Abnormal; Notable for the following components:   RBC 3.53 (*)    Hemoglobin 10.6 (*)    HCT 35.0 (*)    All other components within normal limits  I-STAT VENOUS BLOOD GAS, ED - Abnormal; Notable for the following components:   pCO2, Ven 66.0 (*)    pO2, Ven 197 (*)    Bicarbonate 32.5 (*)    TCO2 35 (*)    Acid-Base Excess 4.0 (*)    Sodium 131 (*)    HCT 34.0 (*)    Hemoglobin 11.6 (*)    All other components within normal limits  TROPONIN I (HIGH SENSITIVITY) - Abnormal; Notable for the following components:   Troponin I (High Sensitivity) 78 (*)    All other components within normal limits  TROPONIN I (HIGH SENSITIVITY) - Abnormal; Notable for the following components:   Troponin I (High Sensitivity) 67 (*)    All other components within normal limits  MAGNESIUM  PROTIME-INR  BRAIN NATRIURETIC PEPTIDE    Notable for elevated troponin ,downtrending likely due to demand  EKG   EKG Interpretation  Date/Time:  Monday February 07 2023 16:50:49 EDT Ventricular Rate:  63 PR Interval:    QRS Duration: 126 QT Interval:  451 QTC Calculation: 462 R Axis:   -46 Text Interpretation: Atrial fibrillation LVH with IVCD, LAD and secondary repol abnrm Compared to previous tracing T wave changes have improved Confirmed by Alvino Blood ([id number]) on 02/07/2023 4:57:09 PM         Imaging Studies ordered: I ordered imaging studies including CXR On my interpretation imaging demonstrates pulmonary edema I independently visualized and interpreted imaging. I agree with the radiologist interpretation   Medicines ordered and prescription drug management: Meds ordered this encounter  Medications   furosemide (LASIX) injection 40 mg   furosemide (LASIX) injection 40 mg   amLODipine (NORVASC) tablet 5 mg   cloNIDine (CATAPRES) tablet 0.2 mg   ranolazine (RANEXA) 12 hr tablet 500 mg   DISCONTD: torsemide (  DEMADEX) tablet 20 mg   famotidine  (PEPCID) tablet 20 mg   ondansetron (ZOFRAN-ODT) disintegrating tablet 4 mg   pantoprazole (PROTONIX) EC tablet 40 mg   apixaban (ELIQUIS) tablet 5 mg   sodium chloride tablet 2 g   DISCONTD: enoxaparin (LOVENOX) injection 40 mg   OR Linked Order Group    acetaminophen (TYLENOL) tablet 650 mg    acetaminophen (TYLENOL) suppository 650 mg   oxyCODONE (Oxy IR/ROXICODONE) immediate release tablet 5 mg   OR Linked Order Group    ondansetron (ZOFRAN) tablet 4 mg    ondansetron (ZOFRAN) injection 4 mg    -I have reviewed the patients home medicines and have made adjustments as needed   Consultations Obtained: I requested consultation with the hospitalist,  and discussed lab and imaging findings as well as pertinent plan - they recommend: admission   Cardiac Monitoring: The patient was maintained on a cardiac monitor.  I personally viewed and interpreted the cardiac monitored which showed an underlying rhythm of: afib  Social Determinants of Health:  Diagnosis or treatment significantly limited by social determinants of health: lives alone   Reevaluation: After the interventions noted above, I reevaluated the patient and found that their symptoms have improved  Co morbidities that complicate the patient evaluation  Past Medical History:  Diagnosis Date   Allergy    rhinitis   Aortic stenosis    mild by echo 06/2017   Arthritis    Bradycardia    a. 10/2017 -> beta blocker cut back due to HR 39.   Breast cancer (HCC) 01/06/2012   Cancer (HCC)    right colon and left breast   Chronic diastolic CHF (congestive heart failure) (HCC)    Colon cancer (HCC) 01/06/2012   Colovesical fistula    Dr. Carolynne Edouard and Dr. Laverle Patter planning surgery 08/2018- surgery revealed spontaneous closure   COPD (chronic obstructive pulmonary disease) (HCC)    pt. denies   Coronary artery disease 2006   a.  NSTEMI in 2016, cath showed 15% prox-mid RCA, 20% prox LAD, EF 25-35% by cath and 35-40% -> felt due to  Takotsubo cardiomyopathy.   Dilated aortic root (HCC)    by echo 06/2017   Diverticulosis    Dyspnea    Edema extremities    GERD (gastroesophageal reflux disease)    Hernia    Hiatal hernia    denies   Hyperlipidemia    Hypertension    Mild aortic stenosis    echo 11/2015 but not noted on echo 06/2016   Osteopenia    Permanent atrial fibrillation (HCC)    chronic atrial fibrillation   Pneumonia    hx child   Pulmonary HTN (HCC)    a. moderate to severe PASP echo 11/2015 - now by echo 06/2017. CTA chest in 11/16 with no PE. PFTs in 7/15 with mild obstructive lung disease. She had a negative sleep study in 2017. b. Felt due to left sided HF.   Stroke [facility])    Takotsubo syndrome 07/29/2015   a. EF 35-40% by echo; akinesis of mid-apical anteroseptal and apical myocardium.  EF now normalized on echo 11/2015      Dispostion: Disposition decision including need for hospitalization was considered, and patient admitted to the hospital.    Final Clinical Impression(s) / ED Diagnoses Final diagnoses:  Acute congestive heart failure, unspecified heart failure type (HCC)  Acute hypoxic respiratory failure (HCC)     This chart was dictated using voice recognition software.  Despite best efforts to proofread,  errors can occur which can change the documentation meaning.    Lonell Grandchild, MD 02/07/23 [phone]

## 2023-02-07 NOTE — Telephone Encounter (Signed)
Call from [place], RN with [facility] called to advised that he is sending the patient to the ER due to O2 level of 65-75% on room air and RR of 28. Thanks.

## 2023-02-07 NOTE — ED Triage Notes (Signed)
Pt to ED via EMS from home with c/o SOB. Per EMS pt stated she has CHF and COPD and started feeling SOB today with LLE swelling. EMS reports pt was 80% RA they placed pt on 6L [place] she came up to 91% they then placed pt on NRB at 15L pt sats came up to 98%. Pt denies CP or being on O2 at home  Aox4 in triage, bilateral LLE swelling noted. Pt O2 sat dropped to 80% on RA in triage, pt placed on 4L [place] O2 sats came up to 95%.

## 2023-02-08 ENCOUNTER — Encounter (HOSPITAL_COMMUNITY): Payer: Self-pay

## 2023-02-08 ENCOUNTER — Observation Stay (HOSPITAL_COMMUNITY): Payer: Medicare Other

## 2023-02-08 ENCOUNTER — Encounter: Payer: Self-pay | Admitting: *Deleted

## 2023-02-08 ENCOUNTER — Observation Stay (HOSPITAL_BASED_OUTPATIENT_CLINIC_OR_DEPARTMENT_OTHER): Payer: Medicare Other

## 2023-02-08 ENCOUNTER — Encounter (HOSPITAL_COMMUNITY): Payer: Medicare Other

## 2023-02-08 ENCOUNTER — Ambulatory Visit: Payer: Self-pay | Admitting: *Deleted

## 2023-02-08 DIAGNOSIS — I130 Hypertensive heart and chronic kidney disease with heart failure and stage 1 through stage 4 chronic kidney disease, or unspecified chronic kidney disease: Secondary | ICD-10-CM | POA: Diagnosis present

## 2023-02-08 DIAGNOSIS — D631 Anemia in chronic kidney disease: Secondary | ICD-10-CM | POA: Diagnosis present

## 2023-02-08 DIAGNOSIS — R0600 Dyspnea, unspecified: Secondary | ICD-10-CM | POA: Diagnosis not present

## 2023-02-08 DIAGNOSIS — I5033 Acute on chronic diastolic (congestive) heart failure: Secondary | ICD-10-CM

## 2023-02-08 DIAGNOSIS — I5031 Acute diastolic (congestive) heart failure: Secondary | ICD-10-CM

## 2023-02-08 DIAGNOSIS — I2510 Atherosclerotic heart disease of native coronary artery without angina pectoris: Secondary | ICD-10-CM

## 2023-02-08 DIAGNOSIS — J9601 Acute respiratory failure with hypoxia: Secondary | ICD-10-CM | POA: Diagnosis present

## 2023-02-08 DIAGNOSIS — I509 Heart failure, unspecified: Secondary | ICD-10-CM

## 2023-02-08 DIAGNOSIS — L8990 Pressure ulcer of unspecified site, unspecified stage: Secondary | ICD-10-CM

## 2023-02-08 DIAGNOSIS — Z515 Encounter for palliative care: Secondary | ICD-10-CM | POA: Diagnosis not present

## 2023-02-08 DIAGNOSIS — Z79899 Other long term (current) drug therapy: Secondary | ICD-10-CM | POA: Diagnosis not present

## 2023-02-08 DIAGNOSIS — J9811 Atelectasis: Secondary | ICD-10-CM | POA: Diagnosis present

## 2023-02-08 DIAGNOSIS — R627 Adult failure to thrive: Secondary | ICD-10-CM | POA: Diagnosis present

## 2023-02-08 DIAGNOSIS — E871 Hypo-osmolality and hyponatremia: Secondary | ICD-10-CM | POA: Diagnosis present

## 2023-02-08 DIAGNOSIS — I4821 Permanent atrial fibrillation: Secondary | ICD-10-CM | POA: Diagnosis present

## 2023-02-08 DIAGNOSIS — M4856XA Collapsed vertebra, not elsewhere classified, lumbar region, initial encounter for fracture: Secondary | ICD-10-CM | POA: Diagnosis present

## 2023-02-08 DIAGNOSIS — S32050A Wedge compression fracture of fifth lumbar vertebra, initial encounter for closed fracture: Secondary | ICD-10-CM

## 2023-02-08 DIAGNOSIS — Z6831 Body mass index (BMI) 31.0-31.9, adult: Secondary | ICD-10-CM | POA: Diagnosis not present

## 2023-02-08 DIAGNOSIS — J439 Emphysema, unspecified: Secondary | ICD-10-CM

## 2023-02-08 DIAGNOSIS — E785 Hyperlipidemia, unspecified: Secondary | ICD-10-CM | POA: Diagnosis present

## 2023-02-08 DIAGNOSIS — N1832 Chronic kidney disease, stage 3b: Secondary | ICD-10-CM | POA: Diagnosis present

## 2023-02-08 DIAGNOSIS — R0602 Shortness of breath: Secondary | ICD-10-CM | POA: Diagnosis not present

## 2023-02-08 DIAGNOSIS — E875 Hyperkalemia: Secondary | ICD-10-CM | POA: Diagnosis present

## 2023-02-08 DIAGNOSIS — J918 Pleural effusion in other conditions classified elsewhere: Secondary | ICD-10-CM | POA: Diagnosis present

## 2023-02-08 DIAGNOSIS — Z66 Do not resuscitate: Secondary | ICD-10-CM | POA: Diagnosis present

## 2023-02-08 DIAGNOSIS — I082 Rheumatic disorders of both aortic and tricuspid valves: Secondary | ICD-10-CM | POA: Diagnosis present

## 2023-02-08 DIAGNOSIS — E042 Nontoxic multinodular goiter: Secondary | ICD-10-CM | POA: Diagnosis present

## 2023-02-08 DIAGNOSIS — G8929 Other chronic pain: Secondary | ICD-10-CM | POA: Diagnosis present

## 2023-02-08 DIAGNOSIS — W1830XA Fall on same level, unspecified, initial encounter: Secondary | ICD-10-CM | POA: Diagnosis present

## 2023-02-08 DIAGNOSIS — I272 Pulmonary hypertension, unspecified: Secondary | ICD-10-CM | POA: Diagnosis present

## 2023-02-08 DIAGNOSIS — L89151 Pressure ulcer of sacral region, stage 1: Secondary | ICD-10-CM | POA: Diagnosis present

## 2023-02-08 DIAGNOSIS — I4819 Other persistent atrial fibrillation: Secondary | ICD-10-CM | POA: Diagnosis not present

## 2023-02-08 DIAGNOSIS — N179 Acute kidney failure, unspecified: Secondary | ICD-10-CM | POA: Diagnosis present

## 2023-02-08 DIAGNOSIS — J449 Chronic obstructive pulmonary disease, unspecified: Secondary | ICD-10-CM | POA: Diagnosis present

## 2023-02-08 LAB — ECHOCARDIOGRAM COMPLETE
AR max vel: 1.11 cm2
AV Area VTI: 1.1 cm2
AV Area mean vel: 1.06 cm2
AV Mean grad: 15.8 mmHg
AV Peak grad: 26.8 mmHg
Ao pk vel: 2.59 m/s
Area-P 1/2: 4.41 cm2
Calc EF: 68.3 %
Height: 65 in
MV VTI: 1.58 cm2
S' Lateral: 2.9 cm
Single Plane A2C EF: 69.1 %
Single Plane A4C EF: 65.2 %
Weight: 3019.42 oz

## 2023-02-08 LAB — BASIC METABOLIC PANEL
Anion gap: 8 (ref 5–15)
BUN: 29 mg/dL — ABNORMAL HIGH (ref 8–23)
CO2: 31 mmol/L (ref 22–32)
Calcium: 9 mg/dL (ref 8.9–10.3)
Chloride: 93 mmol/L — ABNORMAL LOW (ref 98–111)
Creatinine, Ser: 1.36 mg/dL — ABNORMAL HIGH (ref 0.44–1.00)
GFR, Estimated: 38 mL/min — ABNORMAL LOW (ref >60)
Glucose, Bld: 144 mg/dL — ABNORMAL HIGH (ref 70–99)
Potassium: 4.7 mmol/L (ref 3.5–5.1)
Sodium: 132 mmol/L — ABNORMAL LOW (ref 135–145)

## 2023-02-08 LAB — PROTIME-INR
INR: 1 (ref 0.8–1.2)
Prothrombin Time: 13.2 seconds (ref 11.4–15.2)

## 2023-02-08 MED ORDER — IOHEXOL 350 MG/ML SOLN
75.0000 mL | Freq: Once | INTRAVENOUS | Status: AC | PRN
  Administered 2023-02-08: 75 mL via INTRAVENOUS

## 2023-02-08 MED ORDER — IPRATROPIUM-ALBUTEROL 0.5-2.5 (3) MG/3ML IN SOLN
3.0000 mL | RESPIRATORY_TRACT | Status: DC | PRN
  Administered 2023-02-11 – 2023-02-13 (×2): 3 mL via RESPIRATORY_TRACT
  Filled 2023-02-08 (×3): qty 3

## 2023-02-08 MED ORDER — SPIRONOLACTONE 12.5 MG HALF TABLET
12.5000 mg | ORAL_TABLET | Freq: Every day | ORAL | Status: DC
  Administered 2023-02-08 – 2023-02-09 (×2): 12.5 mg via ORAL
  Filled 2023-02-08 (×2): qty 1

## 2023-02-08 MED ORDER — FUROSEMIDE 10 MG/ML IJ SOLN
80.0000 mg | Freq: Once | INTRAMUSCULAR | Status: AC
  Administered 2023-02-08: 80 mg via INTRAVENOUS
  Filled 2023-02-08: qty 8

## 2023-02-08 MED ORDER — IPRATROPIUM-ALBUTEROL 0.5-2.5 (3) MG/3ML IN SOLN
3.0000 mL | Freq: Four times a day (QID) | RESPIRATORY_TRACT | Status: DC
  Administered 2023-02-08 – 2023-02-10 (×7): 3 mL via RESPIRATORY_TRACT
  Filled 2023-02-08 (×10): qty 3

## 2023-02-08 MED ORDER — ORAL CARE MOUTH RINSE: 15.0000 mL | OROMUCOSAL | Status: DC | PRN

## 2023-02-08 MED ORDER — FUROSEMIDE 10 MG/ML IJ SOLN
80.0000 mg | Freq: Two times a day (BID) | INTRAMUSCULAR | Status: DC
  Administered 2023-02-08 – 2023-02-09 (×2): 80 mg via INTRAVENOUS
  Filled 2023-02-08 (×2): qty 8

## 2023-02-08 NOTE — Assessment & Plan Note (Signed)
No signs of exacerbation, continue bronchodilator therapy.  

## 2023-02-08 NOTE — Assessment & Plan Note (Signed)
Echocardiogram with preserved LV systolic function with EF 65 to 70%, mild LVH, RV with preserved systolic function, RVSP 94.4 mmHg.  RA and LA with severe dilatation, severe TR, moderate aortic stenosis.   Urine output documented 705 ml Blood pressure 150's systolic.   Plan to continue diuresis with IV furosemide, increase dose to 80 mg bid. Continue blood pressure control with amlodipine.  Will add spironolactone.

## 2023-02-08 NOTE — Assessment & Plan Note (Signed)
Continue pain control, PT and Ot/.

## 2023-02-08 NOTE — Assessment & Plan Note (Signed)
Rate controlled.  Continue anticoagulation with apixaban. Currently not on AV blockade.

## 2023-02-08 NOTE — Patient Instructions (Signed)
Visit Information  Thank you for taking time to visit with me today. Please don't hesitate to contact me if I can be of assistance to you.   Following are the goals we discussed today:   Goals Addressed               This Visit's Progress     Provide Counseling & Supportive Services. (pt-stated)   On track     Care Coordination Interventions:  Interventions Today    Flowsheet Row Most Recent Value  Chronic Disease   Chronic disease during today's visit Atrial Fibrillation (AFib), Hypertension (HTN), Congestive Heart Failure (CHF), Chronic Kidney Disease/End Stage Renal Disease (ESRD), Chronic Obstructive Pulmonary Disease (COPD), Other  [Feelings of Sadness & Frustration Regarding Loss of Independence.]  General Interventions   General Interventions Discussed/Reviewed General Interventions Discussed, General Interventions Reviewed, Annual Eye Exam, Durable Medical Equipment (DME), Vaccines, Health Screening, Community Resources, Level of Care, Communication with, Doctor Visits  [Primary Care Provider]  Vaccines COVID-19, Flu, Pneumonia, RSV, Shingles, Tetanus/Pertussis/Diphtheria  [Encouraged]  Doctor Visits Discussed/Reviewed Doctor Visits Discussed, Doctor Visits Reviewed, Annual Wellness Visits, PCP, Specialist  [Encouraged]  Health Screening Bone Density, Colonoscopy, Mammogram  [Encouraged]  Durable Medical Equipment (DME) BP Cuff  PCP/Specialist Visits Compliance with follow-up visit  [Encouraged]  Communication with PCP/Specialists, RN  Level of Care Adult Daycare, Applications, Assisted Living, Personal Care Services  Applications Medicaid, Personal Care Services  Exercise Interventions   Exercise Discussed/Reviewed Exercise Discussed, Exercise Reviewed, Physical Activity, Weight Managment, Assistive device use and maintanence  [Encouraged]  Physical Activity Discussed/Reviewed Physical Activity Discussed, Physical Activity Reviewed, Types of exercise, Home Exercise Program  (HEP)  [Encouraged]  Weight Management Weight maintenance  [Encouraged]  Education Interventions   Education Provided Provided Therapist, sports, Provided Education  Provided Verbal Education On Nutrition, Foot Care, Eye Care, Mental Health/Coping with Illness, Applications, Exercise, Medication, Insurance Plans, [employer], When to see the doctor  Applications Medicaid, Personal Care Services  Mental Health Interventions   Mental Health Discussed/Reviewed Mental Health Discussed, Mental Health Reviewed, Coping Strategies, Suicide, Crisis, Substance Abuse, Grief and Loss, Depression, Anxiety  Refer to Social Work for counseling regarding Anxiety/Coping, Depression, Grief and Loss  Nutrition Interventions   Nutrition Discussed/Reviewed Nutrition Discussed, Nutrition Reviewed, Portion sizes, Decreasing sugar intake, Decreasing salt, Decreasing fats, Increaing proteins, Fluid intake, Adding fruits and vegetables  [Encouraged]  Pharmacy Interventions   Pharmacy Dicussed/Reviewed Pharmacy Topics Discussed, Pharmacy Topics Reviewed, Medication Adherence, Affording Medications  Safety Interventions   Safety Discussed/Reviewed Safety Discussed, Safety Reviewed, Fall Risk, Home Safety  [Encouraged]  Home Engineer, water, Refer for community resources  Advanced Directive Interventions   Advanced Directives Discussed/Reviewed Advanced Directives Discussed, Advanced Directives Reviewed     Active Listening & Reflection Utilized.  Verbalization of Feelings Encouraged.  Emotional Support Provided. Feelings of Caregiver Stress & Burnout Validated. Symptoms of Caregiver Fatigue Acknowledged. Caregiver Support Groups Reviewed. Self-Enrollment in Caregiver Support Group of Interest Emphasized. Problem Solving Interventions Employed. Task-Centered Solutions Activated.   Solution-Focused Strategies Implemented. Acceptance & Commitment Therapy Indicated. Cognitive Behavioral Therapy  Initiated. Client-Centered Therapy Performed. CSW Collaboration with Son, [name] Hibler Jr. to [facility] Admission on 02/07/2023, Due to Progressively Worsening Shortness of Breath, Cough, 40 Pound Weight Gain, Mild Respiratory Distress, Hypoxia & Oxygen Saturation in Low 80's, All Within the Last Month.   CSW Collaboration with Son, [name] Dooly Jr. to Encourage Consideration of Higher Level of Care Placement for Patient, Upon Medical Clearance & Discharge from [facility]. CSW  Collaboration with Duanne Guess Loomer Jr. to Explain Social Work Involvement & Agreement to Continue to Follow Patient While Hospitalized, to Assist with Care Coordination & Discharge Planning Needs. CSW Collaboration with Son, [name] Milliron Jr. to [employer] with CSW [phone]# [phone]), if He Has Questions, Needs Assistance, or If Additional Social Work Needs Are Identified Between Now & Our Next Scheduled Follow-Up Outreach Call.      Our next appointment is by telephone on 02/22/2023 at 9:15 am.  Please call the care guide team at [phone] if you need to cancel or reschedule your appointment.   If you are experiencing a Mental Health or Behavioral Health Crisis or need someone to talk to, please call the Suicide and Crisis Lifeline: 988 call the [place] National Suicide Prevention Lifeline: 808 122 0682 or TTY: 8042846746 TTY (669)771-4535) to talk to a trained counselor call 1-800-273-TALK (toll free, 24 hour hotline) go to [facility] [address], [place] [phone]) call the [facility] Crisis Line: 740-228-7165 call 911  Patient verbalizes understanding of instructions and care plan provided today and agrees to view in MyChart. Active MyChart status and patient understanding of how to access instructions and care plan via MyChart confirmed with patient.     Telephone follow up appointment with care management team member scheduled  for:  02/22/2023 at 9:15 am.  Danford Bad, BSW, MSW, LCSW  Licensed Clinical Social Worker  [facility]  Mailing [place] [address], [place], [place] [address] Physical Address-[address], [place], [place] [address] Toll Free Main # [phone] Fax # [phone] Cell # [phone] [email or web address]

## 2023-02-08 NOTE — Hospital Course (Addendum)
[name] was admitted to the hospital with the working diagnosis of heart failure exacerbation.   88 yo female with the past medical history of aortic stenosis, hypertension, coronary artery disease, atrial fibrillation, pulmonary hypertension who presented with dyspnea. Reported four weeks of worsening dyspnea and cough. Noted decreased mobility, orthopnea and 40 lbs weight gain. Recent hospitalization 05/04 to 01/17/23 for closed compression fracture L5 and closed right fibular fracture.  On the day of admission she was in respiratory distress and called EMS. She was found with 02 saturation in the 80's, was placed on supplemental 02 and was transported to the ED. On her initial physical examination her blood pressure was 138/43, HR 56, RR 16 and 02 saturation 99%, lungs with increased work of breathing, no wheezing, heart with S1 and S2 present and regular, abdomen with no distention and positive lower extremity edema.    Chest radiograph with right rotation, cardiomegaly with bilateral hilar vascular congestion, bilateral increased lung markings and small bilateral pleural effusions.   CT chest with no pulmonary embolism. Interlobular septal thickening and bilateral faint ground glass opacities. Bilateral pleural effusions. Stable anteromedial right middle lobe lung nodule.

## 2023-02-08 NOTE — Assessment & Plan Note (Signed)
Continue local care 

## 2023-02-08 NOTE — Assessment & Plan Note (Signed)
Cell count has been stable with Hgb at 11.6

## 2023-02-08 NOTE — Progress Notes (Incomplete)
PROGRESS NOTE    [name]  YNW:[id number] DOB: [birth year] DOA: 02/07/2023 PCP: Donita Brooks, MD   88/F w/aortic stenosis, hypertension, CAD, atrial fibrillation, pulmonary hypertension who presented with dyspnea. Reported four weeks of worsening dyspnea, orthopnea and 40 lbs weight gain.  -Recent hospitalization 5/4-5/13 for closed compression fracture L5 and closed right fibular fracture.  -admitted 6/3 w/ respiratory distress and hypoxia. -CXR w/cardiomegaly with bilateral hilar vascular congestion, bilateral increased lung markings and small bilateral pleural effusions.     Subjective:   Assessment and Plan:  Acute on chronic diastolic heart failure (HCC) Echo with EF 65 to 70%, mild LVH, RV with preserved systolic function, RVSP 94.4 mmHg.  RA and LA with severe dilatation, severe TR, moderate aortic stenosis.  -continue diuresis with IV furosemide, increase 80 mg bid. Continue amlodipine, add spironolactone.    Persistent atrial fibrillation (HCC) Rate controlled.  Continue apixaban.   Coronary artery disease -stable   Essential hypertension Continue amlodipine and clonidine. Possible transition to ARB during this hospitalization.  Continue IV furosemide.    Anemia of chronic disease -stable with Hgb at 11.6   COPD (chronic obstructive pulmonary disease) (HCC) -continue bronchodilator therapy.    GERD (gastroesophageal reflux disease) Continue antiacid therapy.   Closed compression fracture of L5 lumbar vertebra, initial encounter (HCC) Continue pain control, PT and Ot/.    Pressure injury of skin Continue local care     DVT prophylaxis: Code Status:  Family Communication: Disposition Plan:   Consultants:    Procedures:   Antimicrobials:    Objective: Vitals:   02/08/23 1245 02/08/23 1300 02/08/23 1315 02/08/23 1353  BP: (!) 131/45 134/68 (!) 144/61 (!) 153/46  Pulse: 64 63 63 63  Resp: (!) 23 19 18 18   Temp:  97.9 F (36.6 C)  98.1  F (36.7 C)  TempSrc:  Oral  Axillary  SpO2: 99% 98% 99% 94%  Weight:    78.9 kg  Height:    5\' 5"  (1.651 m)    Intake/Output Summary (Last 24 hours) at 02/08/2023 1400 Last data filed at 02/08/2023 0643 Gross per 24 hour  Intake --  Output 750 ml  Net -750 ml   Filed Weights   02/07/23 1654 02/08/23 1353  Weight: 85.6 kg 78.9 kg    Examination:  General exam: Appears calm and comfortable  Respiratory system: Clear to auscultation Cardiovascular system: S1 & S2 heard, RRR.  Abd: nondistended, soft and nontender.Normal bowel sounds heard. Central nervous system: Alert and oriented. No focal neurological deficits. Extremities: no edema Skin: No rashes Psychiatry:  Mood & affect appropriate.     Data Reviewed:   CBC: Recent Labs  Lab 02/07/23 1641 02/07/23 1726  WBC 6.8  --   NEUTROABS 3.8  --   HGB 10.6* 11.6*  HCT 35.0* 34.0*  MCV 99.2  --   PLT 223  --    Basic Metabolic Panel: Recent Labs  Lab 02/07/23 1641 02/07/23 1726  NA 131* 131*  K 4.1 4.2  CL 94*  --   CO2 31  --   GLUCOSE 134*  --   BUN 22  --   CREATININE 1.17*  --   CALCIUM 8.8*  --   MG 1.9  --    GFR: Estimated Creatinine Clearance: 35.9 mL/min (A) (by C-G formula based on SCr of 1.17 mg/dL (H)). Liver Function Tests: Recent Labs  Lab 02/07/23 1641  AST 35  ALT 23  ALKPHOS 84  BILITOT 0.5  PROT  6.1*  ALBUMIN 3.1*   No results for input(s): "LIPASE", "AMYLASE" in the last 168 hours. No results for input(s): "AMMONIA" in the last 168 hours. Coagulation Profile: No results for input(s): "INR", "PROTIME" in the last 168 hours. Cardiac Enzymes: No results for input(s): "CKTOTAL", "CKMB", "CKMBINDEX", "TROPONINI" in the last 168 hours. BNP (last 3 results) No results for input(s): "PROBNP" in the last 8760 hours. HbA1C: No results for input(s): "HGBA1C" in the last 72 hours. CBG: No results for input(s): "GLUCAP" in the last 168 hours. Lipid Profile: No results for input(s):  "CHOL", "HDL", "LDLCALC", "TRIG", "CHOLHDL", "LDLDIRECT" in the last 72 hours. Thyroid Function Tests: No results for input(s): "TSH", "T4TOTAL", "FREET4", "T3FREE", "THYROIDAB" in the last 72 hours. Anemia Panel: No results for input(s): "VITAMINB12", "FOLATE", "FERRITIN", "TIBC", "IRON", "RETICCTPCT" in the last 72 hours. Urine analysis:    Component Value Date/Time   COLORURINE YELLOW 01/09/2023 0948   APPEARANCEUR CLEAR 01/09/2023 0948   LABSPEC 1.012 01/09/2023 0948   PHURINE 5.0 01/09/2023 0948   GLUCOSEU NEGATIVE 01/09/2023 0948   HGBUR SMALL (A) 01/09/2023 0948   BILIRUBINUR NEGATIVE 01/09/2023 0948   KETONESUR NEGATIVE 01/09/2023 0948   PROTEINUR NEGATIVE 01/09/2023 0948   UROBILINOGEN 0.2 11/04/2013 1321   NITRITE NEGATIVE 01/09/2023 0948   LEUKOCYTESUR NEGATIVE 01/09/2023 0948   Sepsis Labs: @LABRCNTIP (procalcitonin:4,lacticidven:4)  )No results found for this or any previous visit (from the past 240 hour(s)).   Radiology Studies: ECHOCARDIOGRAM COMPLETE  Result Date: 02/08/2023    ECHOCARDIOGRAM REPORT   Patient Name:   [name] Date of Exam: 02/08/2023 Medical Rec #:  [id number]  Height:       65.0 in Accession #:    [id number] Weight:       188.7 lb Date of Birth:  [birth year]   BSA:          1.930 m Patient Age:    88 years   BP:           144/54 mmHg Patient Gender: F          HR:           67 bpm. Exam Location:  Inpatient Procedure: 2D Echo, Cardiac Doppler and Color Doppler Indications:    CHF - Acute Diastolic  History:        Patient has prior history of Echocardiogram examinations, most                 recent 03/17/2022. CHF and Hx of Takotsubo, CAD, COPD, Pulmonary                 HTN, TIA and Hx of cancer, CKD, Arrythmias:Atrial Fibrillation,                 Signs/Symptoms:Dyspnea, Edema and Shortness of Breath; Risk                 Factors:Hypertension and Dyslipidemia.  Sonographer:    Wallie Char Referring Phys: [id number] ROBERT DORRELL IMPRESSIONS  1. Left  ventricular ejection fraction, by estimation, is 65 to 70%. The left ventricle has normal function. The left ventricle has no regional wall motion abnormalities. There is mild concentric left ventricular hypertrophy. Left ventricular diastolic function could not be evaluated.  2. Right ventricular systolic function is normal. The right ventricular size is normal. There is severely elevated pulmonary artery systolic pressure.  3. Left atrial size was severely dilated.  4. Right atrial size was severely dilated.  5. The mitral valve is normal in  structure. Mild mitral valve regurgitation. No evidence of mitral stenosis. Moderate mitral annular calcification.  6. Tricuspid valve regurgitation is severe.  7. The aortic valve is normal in structure. There is severe calcifcation of the aortic valve. There is moderate thickening of the aortic valve. Aortic valve regurgitation is not visualized. Moderate aortic valve stenosis. Aortic valve mean gradient measures 15.8 mmHg. Aortic valve Vmax measures 2.59 m/s.  8. Aortic dilatation noted. There is borderline dilatation of the ascending aorta, measuring 38 mm.  9. The inferior vena cava is dilated in size with >50% respiratory variability, suggesting right atrial pressure of 8 mmHg. FINDINGS  Left Ventricle: Left ventricular ejection fraction, by estimation, is 65 to 70%. The left ventricle has normal function. The left ventricle has no regional wall motion abnormalities. The left ventricular internal cavity size was normal in size. There is  mild concentric left ventricular hypertrophy. Left ventricular diastolic function could not be evaluated due to atrial fibrillation. Left ventricular diastolic function could not be evaluated. Right Ventricle: The right ventricular size is normal. No increase in right ventricular wall thickness. Right ventricular systolic function is normal. There is severely elevated pulmonary artery systolic pressure. The tricuspid regurgitant velocity  is 4.54 m/s, and with an assumed right atrial pressure of 12 mmHg, the estimated right ventricular systolic pressure is 94.4 mmHg. Left Atrium: Left atrial size was severely dilated. Right Atrium: Right atrial size was severely dilated. Pericardium: There is no evidence of pericardial effusion. Mitral Valve: The mitral valve is normal in structure. Moderate mitral annular calcification. Mild mitral valve regurgitation. No evidence of mitral valve stenosis. MV peak gradient, 11.3 mmHg. The mean mitral valve gradient is 2.0 mmHg. Tricuspid Valve: The tricuspid valve is normal in structure. Tricuspid valve regurgitation is severe. No evidence of tricuspid stenosis. Aortic Valve: The aortic valve is normal in structure. There is severe calcifcation of the aortic valve. There is moderate thickening of the aortic valve. Aortic valve regurgitation is not visualized. Moderate aortic stenosis is present. Aortic valve mean gradient measures 15.8 mmHg. Aortic valve peak gradient measures 26.8 mmHg. Aortic valve area, by VTI measures 1.10 cm. Pulmonic Valve: The pulmonic valve was normal in structure. Pulmonic valve regurgitation is mild. No evidence of pulmonic stenosis. Aorta: Aortic dilatation noted. There is borderline dilatation of the ascending aorta, measuring 38 mm. Venous: The inferior vena cava is dilated in size with greater than 50% respiratory variability, suggesting right atrial pressure of 8 mmHg. IAS/Shunts: No atrial level shunt detected by color flow Doppler.  LEFT VENTRICLE PLAX 2D LVIDd:         4.30 cm     Diastology LVIDs:         2.90 cm     LV e' medial:    5.49 cm/s LV PW:         0.90 cm     LV E/e' medial:  29.1 LV IVS:        1.20 cm     LV e' lateral:   4.41 cm/s LVOT diam:     1.60 cm     LV E/e' lateral: 36.3 LV SV:         64 LV SV Index:   33 LVOT Area:     2.01 cm  LV Volumes (MOD) LV vol d, MOD A2C: 85.0 ml LV vol d, MOD A4C: 63.8 ml LV vol s, MOD A2C: 26.3 ml LV vol s, MOD A4C: 22.2 ml LV  SV MOD A2C:  58.7 ml LV SV MOD A4C:     63.8 ml LV SV MOD BP:      52.3 ml RIGHT VENTRICLE            IVC RV Basal diam:  4.40 cm    IVC diam: 2.60 cm RV S prime:     8.36 cm/s TAPSE (M-mode): 1.4 cm LEFT ATRIUM             Index        RIGHT ATRIUM           Index LA diam:        4.80 cm 2.49 cm/m   RA Area:     28.10 cm LA Vol (A2C):   98.4 ml 50.99 ml/m  RA Volume:   90.40 ml  46.84 ml/m LA Vol (A4C):   91.4 ml 47.36 ml/m LA Biplane Vol: 95.3 ml 49.38 ml/m  AORTIC VALVE AV Area (Vmax):    1.11 cm AV Area (Vmean):   1.06 cm AV Area (VTI):     1.10 cm AV Vmax:           259.00 cm/s AV Vmean:          188.000 cm/s AV VTI:            0.587 m AV Peak Grad:      26.8 mmHg AV Mean Grad:      15.8 mmHg LVOT Vmax:         143.50 cm/s LVOT Vmean:        99.550 cm/s LVOT VTI:          0.320 m LVOT/AV VTI ratio: 0.55  AORTA Ao Root diam: 3.70 cm Ao Asc diam:  3.80 cm MITRAL VALVE                TRICUSPID VALVE MV Area (PHT): 4.41 cm     TR Peak grad:   82.4 mmHg MV Area VTI:   1.58 cm     TR Mean grad:   52.0 mmHg MV Peak grad:  11.3 mmHg    TR Vmax:        454.00 cm/s MV Mean grad:  2.0 mmHg     TR Vmean:       341.0 cm/s MV Vmax:       1.68 m/s MV Vmean:      66.7 cm/s    SHUNTS MV Decel Time: 172 msec     Systemic VTI:  0.32 m MV E velocity: 160.00 cm/s  Systemic Diam: 1.60 cm Arvilla Meres MD Electronically signed by Arvilla Meres MD Signature Date/Time: 02/08/2023/9:34:27 AM    Final    CT Angio Chest Pulmonary Embolism (PE) W or WO Contrast  Result Date: 02/08/2023 CLINICAL DATA:  Shortness of breath. EXAM: CT ANGIOGRAPHY CHEST WITH CONTRAST TECHNIQUE: Multidetector CT imaging of the chest was performed using the standard protocol during bolus administration of intravenous contrast. Multiplanar CT image reconstructions and MIPs were obtained to evaluate the vascular anatomy. RADIATION DOSE REDUCTION: This exam was performed according to the departmental dose-optimization program which includes  automated exposure control, adjustment of the mA and/or kV according to patient size and/or use of iterative reconstruction technique. CONTRAST:  75mL OMNIPAQUE IOHEXOL 350 MG/ML SOLN COMPARISON:  March 17, 2022 FINDINGS: Cardiovascular: There is marked severity calcification of the thoracic aorta. The ascending thoracic aorta measures 4.1 cm in diameter. Satisfactory opacification of the pulmonary arteries to the segmental level. No evidence of pulmonary embolism. There  is mild cardiomegaly with moderate to marked severity coronary artery calcification. No pericardial effusion. Mediastinum/Nodes: No enlarged mediastinal, hilar, or axillary lymph nodes. A stable 2.8 cm x 2.0 cm heterogeneous thyroid nodule is seen within the inferior aspect of the left lobe of the thyroid gland. A smaller thyroid nodule is seen within the right lobe of the thyroid gland. The trachea and esophagus demonstrate no significant findings. Lungs/Pleura: Mild to moderate severity diffuse interstitial thickening is seen. A 5 mm calcified lung nodule is seen within the anterolateral aspect of the right upper lobe. Moderate to marked severity compressive atelectasis is noted within the bilateral lower lobes. Mild to moderate severity biapical scarring and/or atelectasis is also seen. A predominantly stable 8 mm x 9 mm lung nodule seen within the anteromedial aspect of the right middle lobe. There are bilateral moderate to large pleural effusions. No pneumothorax is identified. Upper Abdomen: There is a small hiatal hernia. Numerous punctate calcified granulomas are seen scattered throughout the spleen. Musculoskeletal: Multilevel degenerative changes seen throughout the thoracic spine. Prior vertebroplasty is noted within the upper lumbar spine. Review of the MIP images confirms the above findings. IMPRESSION: 1. No evidence of pulmonary embolism. 2. Mild to moderate severity diffuse interstitial thickening which Wieand represent mild pulmonary  edema. 3. Bilateral moderate to large pleural effusions with moderate to marked severity bilateral lower lobe compressive atelectasis. 4. Stable bilateral thyroid nodules. Correlation with follow-up nonemergent thyroid ultrasound is recommended. 5. Predominantly stable anteromedial right middle lobe lung nodule. Consider one of the following in 3 months for both low-risk and high-risk individuals: (a) repeat chest CT, (b) follow-up PET-CT, or (c) tissue sampling. This recommendation follows the consensus statement: Guidelines for Management of Incidental Pulmonary Nodules Detected on CT Images: From the Fleischner Society 2017; Radiology 2017; 284:228-243. 6. Small hiatal hernia. 7. Aortic atherosclerosis. Aortic Atherosclerosis (ICD10-I70.0). Electronically Signed   By: Aram Candela M.D.   On: 02/08/2023 03:19   DG Chest Port 1 View  Result Date: 02/07/2023 CLINICAL DATA:  Cough EXAM: PORTABLE CHEST 1 VIEW COMPARISON:  Chest x-ray 11/18/2022 FINDINGS: Patient is rotated. Heart is enlarged. There central pulmonary vascular congestion with additional interstitial opacities in the left lower lung. There is a small left pleural effusion. There is no pneumothorax or acute fracture. IMPRESSION: 1. Cardiomegaly with central pulmonary vascular congestion. 2. Interstitial opacities in the left lower lung Piekarski reflect asymmetric edema or infection. 3. Small left pleural effusion. Electronically Signed   By: Darliss Cheney M.D.   On: 02/07/2023 17:20     Scheduled Meds:  amLODipine  5 mg Oral Daily   apixaban  5 mg Oral BID   cloNIDine  0.2 mg Oral BID   pantoprazole  40 mg Oral Daily   sodium chloride  2 g Oral BID WC   Continuous Infusions:   LOS: 0 days    Time spent: ***    Zannie Cove, MD [facility]   02/08/2023, 2:00 PM

## 2023-02-08 NOTE — Assessment & Plan Note (Signed)
Continue antiacid therapy  ?

## 2023-02-08 NOTE — Progress Notes (Addendum)
Progress Note   Patient: [name] ZOX:[id number] DOB: [birth year] DOA: 02/07/2023     0 DOS: the patient was seen and examined on 02/08/2023   Brief hospital course: [name] was admitted to the hospital with the working diagnosis of heart failure exacerbation.   88 yo female with the past medical history of aortic stenosis, hypertension, coronary artery disease, atrial fibrillation, pulmonary hypertension who presented with dyspnea. Reported four weeks of worsening dyspnea and cough. Noted decreased mobility, orthopnea and 40 lbs weight gain. Recent hospitalization 05/04 to 01/17/23 for closed compression fracture L5 and closed right fibular fracture.  On the day of admission she was in respiratory distress and called EMS. She was found with 02 saturation in the 80's, was placed on supplemental 02 and was transported to the ED. On her initial physical examination her blood pressure was 138/43, HR 56, RR 16 and 02 saturation 99%, lungs with increased work of breathing, no wheezing, heart with S1 and S2 present and regular, abdomen with no distention and positive lower extremity edema.    Chest radiograph with right rotation, cardiomegaly with bilateral hilar vascular congestion, bilateral increased lung markings and small bilateral pleural effusions.   CT chest with no pulmonary embolism. Interlobular septal thickening and bilateral faint ground glass opacities. Bilateral pleural effusions. Stable anteromedial right middle lobe lung nodule.    Assessment and Plan: * Acute on chronic diastolic heart failure (HCC) Echocardiogram with preserved LV systolic function with EF 65 to 70%, mild LVH, RV with preserved systolic function, RVSP 94.4 mmHg.  RA and LA with severe dilatation, severe TR, moderate aortic stenosis.   Urine output documented 705 ml Blood pressure 150's systolic.   Plan to continue diuresis with IV furosemide, increase dose to 80 mg bid. Continue blood pressure control with  amlodipine.  Will add spironolactone.   Persistent atrial fibrillation (HCC) Rate controlled.  Continue anticoagulation with apixaban. Currently not on AV blockade.   Coronary artery disease No chest pain, continue blood pressure control.   Essential hypertension Continue blood pressure control with amlodipine and clonidine. Possible transition to ARB during this hospitalization.  Continue diuresis with IV furosemide.   Anemia of chronic disease Cell count has been stable with Hgb at 11.6  COPD (chronic obstructive pulmonary disease) (HCC) No signs of exacerbation, continue bronchodilator therapy.   GERD (gastroesophageal reflux disease) Continue antiacid therapy.  Closed compression fracture of L5 lumbar vertebra, initial encounter (HCC) Continue pain control, PT and Ot/.   Pressure injury of skin Continue local care.         Subjective: Patient with no chest pain, but positive back pain, continue to have dyspnea and edema.   Physical Exam: Vitals:   02/08/23 1300 02/08/23 1315 02/08/23 1353 02/08/23 1529  BP: 134/68 (!) 144/61 (!) 153/46 (!) 152/44  Pulse: 63 63 63 63  Resp: 19 18 18  (!) 21  Temp: 97.9 F (36.6 C)  98.1 F (36.7 C) 98.8 F (37.1 C)  TempSrc: Oral  Axillary Oral  SpO2: 98% 99% 94% 100%  Weight:   78.9 kg   Height:   5\' 5"  (1.651 m)    Neurology awake and alert ENT with mild pallor Cardiovascular with S1 and S2 present, irregularly irregular with positive systolic murmur at the apex Moderate JVD Positive lower extremity edema +++ pitting Respiratory with positive rales and inspiratory wheezing, no rhonchi Abdomen with no distention   Data Reviewed:    Family Communication: I spoke with patient's nephew at the  bedside, we talked in detail about patient's condition, plan of care and prognosis and all questions were addressed.   Disposition: Status is: Observation The patient will require care spanning > 2 midnights and should be  moved to inpatient because: heart failure need IV diuresis.   Planned Discharge Destination: Home     Author: Coralie Keens, MD 02/08/2023 4:42 PM  For on call review [email or web address].

## 2023-02-08 NOTE — Patient Outreach (Signed)
Care Coordination   Follow Up Visit Note   02/08/2023  Name: [name] MRN: [id number] DOB: [birth year]  [name] is a 88 y.o. year old female who sees [name], [name], MD for primary care. I spoke withLois M Debellis and son, Roland Bladow. by phone today.  What matters to the patients health and wellness today?  Provide Counseling & Supportive Services.   Goals Addressed               This Visit's Progress     Provide Counseling & Supportive Services. (pt-stated)   On track     Care Coordination Interventions:  Interventions Today    Flowsheet Row Most Recent Value  Chronic Disease   Chronic disease during today's visit Atrial Fibrillation (AFib), Hypertension (HTN), Congestive Heart Failure (CHF), Chronic Kidney Disease/End Stage Renal Disease (ESRD), Chronic Obstructive Pulmonary Disease (COPD), Other  [Feelings of Sadness & Frustration Regarding Loss of Independence.]  General Interventions   General Interventions Discussed/Reviewed General Interventions Discussed, General Interventions Reviewed, Annual Eye Exam, Durable Medical Equipment (DME), Vaccines, Health Screening, Community Resources, Level of Care, Communication with, Doctor Visits  [Primary Care Provider]  Vaccines COVID-19, Flu, Pneumonia, RSV, Shingles, Tetanus/Pertussis/Diphtheria  [Encouraged]  Doctor Visits Discussed/Reviewed Doctor Visits Discussed, Doctor Visits Reviewed, Annual Wellness Visits, PCP, Specialist  [Encouraged]  Health Screening Bone Density, Colonoscopy, Mammogram  [Encouraged]  Durable Medical Equipment (DME) BP Cuff  PCP/Specialist Visits Compliance with follow-up visit  [Encouraged]  Communication with PCP/Specialists, RN  Level of Care Adult Daycare, Applications, Assisted Living, Personal Care Services  Applications Medicaid, Personal Care Services  Exercise Interventions   Exercise Discussed/Reviewed Exercise Discussed, Exercise Reviewed, Physical Activity, Weight Managment, Assistive  device use and maintanence  [Encouraged]  Physical Activity Discussed/Reviewed Physical Activity Discussed, Physical Activity Reviewed, Types of exercise, Home Exercise Program (HEP)  [Encouraged]  Weight Management Weight maintenance  [Encouraged]  Education Interventions   Education Provided Provided Therapist, sports, Provided Education  Provided Verbal Education On Nutrition, Foot Care, Eye Care, Mental Health/Coping with Illness, Applications, Exercise, Medication, Insurance Plans, [employer], When to see the doctor  Applications Medicaid, Personal Care Services  Mental Health Interventions   Mental Health Discussed/Reviewed Mental Health Discussed, Mental Health Reviewed, Coping Strategies, Suicide, Crisis, Substance Abuse, Grief and Loss, Depression, Anxiety  Refer to Social Work for counseling regarding Anxiety/Coping, Depression, Grief and Loss  Nutrition Interventions   Nutrition Discussed/Reviewed Nutrition Discussed, Nutrition Reviewed, Portion sizes, Decreasing sugar intake, Decreasing salt, Decreasing fats, Increaing proteins, Fluid intake, Adding fruits and vegetables  [Encouraged]  Pharmacy Interventions   Pharmacy Dicussed/Reviewed Pharmacy Topics Discussed, Pharmacy Topics Reviewed, Medication Adherence, Affording Medications  Safety Interventions   Safety Discussed/Reviewed Safety Discussed, Safety Reviewed, Fall Risk, Home Safety  [Encouraged]  Home Engineer, water, Refer for community resources  Advanced Directive Interventions   Advanced Directives Discussed/Reviewed Advanced Directives Discussed, Advanced Directives Reviewed     Active Listening & Reflection Utilized.  Verbalization of Feelings Encouraged.  Emotional Support Provided. Feelings of Caregiver Stress & Burnout Validated. Symptoms of Caregiver Fatigue Acknowledged. Caregiver Support Groups Reviewed. Self-Enrollment in Caregiver Support Group of Interest Emphasized. Problem Solving  Interventions Employed. Task-Centered Solutions Activated.   Solution-Focused Strategies Implemented. Acceptance & Commitment Therapy Indicated. Cognitive Behavioral Therapy Initiated. Client-Centered Therapy Performed. CSW Collaboration with Son, [name] Alviar Jr. to [facility] Admission on 02/07/2023, Due to Progressively Worsening Shortness of Breath, Cough, 40 Pound Weight Gain, Mild Respiratory Distress, Hypoxia & Oxygen Saturation in Low 80's, All  Within the Last Month.   CSW Collaboration with Son, [name] Fatula Jr. to Encourage Consideration of Higher Level of Care Placement for Patient, Upon Medical Clearance & Discharge from [facility]. CSW Collaboration with Duanne Guess Baez Jr. to Explain Social Work Involvement & Agreement to Continue to Follow Patient While Hospitalized, to Assist with Care Coordination & Discharge Planning Needs. CSW Collaboration with Son, [name] Golberg Jr. to [employer] with CSW [phone]# [phone]), if He Has Questions, Needs Assistance, or If Additional Social Work Needs Are Identified Between Now & Our Next Scheduled Follow-Up Outreach Call.      SDOH assessments and interventions completed:  Yes.  Care Coordination Interventions:  Yes, provided.   Follow up plan: Follow up call scheduled for 02/22/2023 at 9:15 am.  Encounter Outcome:  Pt. Visit Completed.   Danford Bad, BSW, MSW, LCSW  Licensed Restaurant manager, [employer] [facility]  Mailing [place] [address], [place], [place] [address] Physical Address-[address], [place], [place] [address] Toll Free Main # [phone] Fax # [phone] Cell # [phone] [email or web address]

## 2023-02-08 NOTE — ED Notes (Signed)
ED TO INPATIENT HANDOFF REPORT  ED Nurse Name and Phone #: Corrie Dandy, RN  S Name/Age/Gender [name] 88 y.o. female Room/Bed: 011C/011C  Code Status   Code Status: DNR  Home/SNF/Other Rehab Patient oriented to: self, place, time, and situation Is this baseline? Yes   Triage Complete: Triage complete  Chief Complaint Acute exacerbation of CHF (congestive heart failure) (HCC) [I50.9]  Triage Note Pt to ED via EMS from home with c/o SOB. Per EMS pt stated she has CHF and COPD and started feeling SOB today with LLE swelling. EMS reports pt was 80% RA they placed pt on 6L [place] she came up to 91% they then placed pt on NRB at 15L pt sats came up to 98%. Pt denies CP or being on O2 at home  Aox4 in triage, bilateral LLE swelling noted. Pt O2 sat dropped to 80% on RA in triage, pt placed on 4L [place] O2 sats came up to 95%.   Allergies Allergies  Allergen Reactions   Contrast Media [Iodinated Contrast Media] Hives and Other (See Comments)    Chest pain and burning    Clonidine Derivatives Other (See Comments)    Dry mouth, throat   Statins Other (See Comments)    Myalgias   Sulfa Antibiotics Diarrhea    Tremors    Celebrex [Celecoxib] Rash   Isordil [Isosorbide Nitrate] Itching and Rash   Tape Itching and Rash    Redness at application site Reaction is to paper and plastic tapes and EKG leads    Level of Care/Admitting Diagnosis ED Disposition     ED Disposition  Admit   Condition  --   Comment  Hospital Area: [facility] [[id number]]  Level of Care: Telemetry Cardiac [[id number]]  Gilson place patient in observation at [facility] or [facility] if equivalent level of care is available:: No  Covid Evaluation: Asymptomatic - no recent exposure (last 10 days) testing not required  Diagnosis: Acute exacerbation of CHF (congestive heart failure) [facility]) [[id number]]  Admitting Physician: Alan Mulder [[id number]]  Attending Physician: Alan Mulder [[id number]]           B Medical/Surgery History Past Medical History:  Diagnosis Date   Allergy    rhinitis   Aortic stenosis    mild by echo 06/2017   Arthritis    Bradycardia    a. 10/2017 -> beta blocker cut back due to HR 39.   Breast cancer (HCC) 01/06/2012   Cancer (HCC)    right colon and left breast   Chronic diastolic CHF (congestive heart failure) (HCC)    Colon cancer (HCC) 01/06/2012   Colovesical fistula    Dr. Carolynne Edouard and Dr. Laverle Patter planning surgery 08/2018- surgery revealed spontaneous closure   COPD (chronic obstructive pulmonary disease) (HCC)    pt. denies   Coronary artery disease 2006   a.  NSTEMI in 2016, cath showed 15% prox-mid RCA, 20% prox LAD, EF 25-35% by cath and 35-40% -> felt due to Takotsubo cardiomyopathy.   Dilated aortic root (HCC)    by echo 06/2017   Diverticulosis    Dyspnea    Edema extremities    GERD (gastroesophageal reflux disease)    Hernia    Hiatal hernia    denies   Hyperlipidemia    Hypertension    Mild aortic stenosis    echo 11/2015 but not noted on echo 06/2016   Osteopenia    Permanent atrial fibrillation (HCC)    chronic atrial fibrillation  Pneumonia    hx child   Pulmonary HTN (HCC)    a. moderate to severe PASP echo 11/2015 - now by echo 06/2017. CTA chest in 11/16 with no PE. PFTs in 7/15 with mild obstructive lung disease. She had a negative sleep study in 2017. b. Felt due to left sided HF.   Stroke [facility])    Takotsubo syndrome 07/29/2015   a. EF 35-40% by echo; akinesis of mid-apical anteroseptal and apical myocardium.  EF now normalized on echo 11/2015   Past Surgical History:  Procedure Laterality Date   APPENDECTOMY     BIOPSY  01/15/2023   Procedure: BIOPSY;  Surgeon: Lynann Bologna, DO;  Location: [facility] ENDOSCOPY;  Service: Gastroenterology;;   BREAST SURGERY     lumpectomy left   CARDIAC CATHETERIZATION     CARDIAC CATHETERIZATION N/A 07/28/2015   Procedure: Left Heart Cath and Coronary Angiography;   Surgeon: Peter M Swaziland, MD;  Location: [facility] INVASIVE CV LAB;  Service: Cardiovascular;  Laterality: N/A;   CHOLECYSTECTOMY     COLECTOMY     right side   CYSTOSCOPY WITH STENT PLACEMENT Left 09/04/2018   Procedure: CYSTOSCOPY WITH LEFT STENT PLACEMENT, BLADDER REPAIR, CYSTOSCOPY WITH LEFT STENT REMOVAL;  Surgeon: Heloise Purpura, MD;  Location: [facility] ORS;  Service: Urology;  Laterality: Left;   ENTEROSCOPY N/A 01/16/2023   Procedure: ENTEROSCOPY;  Surgeon: Lynann Bologna, DO;  Location: [facility];  Service: Gastroenterology;  Laterality: N/A;   ESOPHAGOGASTRODUODENOSCOPY (EGD) WITH PROPOFOL N/A 01/15/2023   Procedure: ESOPHAGOGASTRODUODENOSCOPY (EGD) WITH PROPOFOL;  Surgeon: Lynann Bologna, DO;  Location: [facility];  Service: Gastroenterology;  Laterality: N/A;   EXCISION OF ACCESSORY NIPPLE Bilateral 05/30/2013   Procedure: BILATERAL NIPPLE BIOPSY;  Surgeon: Robyne Askew, MD;  Location: [facility] OR;  Service: General;  Laterality: Bilateral;   EYE SURGERY Bilateral 12   cataracts   HOT HEMOSTASIS N/A 01/15/2023   Procedure: HOT HEMOSTASIS (ARGON PLASMA COAGULATION/BICAP);  Surgeon: Lynann Bologna, DO;  Location: [facility];  Service: Gastroenterology;  Laterality: N/A;   HYSTEROSCOPY WITH D & C N/A 05/29/2020   Procedure: DILATATION AND CURETTAGE /HYSTEROSCOPY;  Surgeon: Natale Milch, MD;  Location: [facility] ORS;  Service: Gynecology;  Laterality: N/A;   IR KYPHO EA ADDL LEVEL THORACIC OR LUMBAR  11/22/2022   IR KYPHO LUMBAR INC FX REDUCE BONE BX UNI/BIL CANNULATION INC/IMAGING  11/22/2022   IR RADIOLOGIST EVAL & MGMT  07/26/2017   LAPAROSCOPIC RIGHT COLECTOMY N/A 09/04/2018   Procedure: LAPAROSCOPIC ASSISTED SIGMOID COLECTOMY WITH REPAIR OF FISTULA TO BLADDER;  Surgeon: Griselda Miner, MD;  Location: [facility] ORS;  Service: General;  Laterality: N/A;   RIGHT/LEFT HEART CATH AND CORONARY ANGIOGRAPHY N/A 12/10/2020   Procedure: RIGHT/LEFT HEART CATH AND CORONARY ANGIOGRAPHY;  Surgeon:  Laurey Morale, MD;  Location: [facility] INVASIVE CV LAB;  Service: Cardiovascular;  Laterality: N/A;   SPLIT NIGHT STUDY  02/02/2016         A IV Location/Drains/Wounds Patient Lines/Drains/Airways Status     Active Line/Drains/Airways     Name Placement date Placement time Site Days   Peripheral IV 02/07/23 20 G Right Antecubital 02/07/23  1639  Antecubital  1   Urethral Catheter Melinda RN Straight-tip 14 Fr. 02/08/23  0130  Straight-tip  less than 1   Wound / Incision (Open or Dehisced) 01/15/23 Skin tear Arm Left;Lower;Posterior thin oval shaped skin tear to left forearm red wound bed with active bleeding 01/15/23  2100  Arm  24            Intake/Output Last 24 hours  Intake/Output Summary (Last 24 hours) at 02/08/2023 1227 Last data filed at 02/08/2023 1610 Gross per 24 hour  Intake --  Output 750 ml  Net -750 ml    Labs/Imaging Results for orders placed or performed during the hospital encounter of 02/07/23 (from the past 48 hour(s))  Comprehensive metabolic panel     Status: Abnormal   Collection Time: 02/07/23  4:41 PM  Result Value Ref Range   Sodium 131 (L) 135 - 145 mmol/L   Potassium 4.1 3.5 - 5.1 mmol/L   Chloride 94 (L) 98 - 111 mmol/L   CO2 31 22 - 32 mmol/L   Glucose, Bld 134 (H) 70 - 99 mg/dL    Comment: Glucose reference range applies only to samples taken after fasting for at least 8 hours.   BUN 22 8 - 23 mg/dL   Creatinine, Ser 9.60 (H) 0.44 - 1.00 mg/dL   Calcium 8.8 (L) 8.9 - 10.3 mg/dL   Total Protein 6.1 (L) 6.5 - 8.1 g/dL   Albumin 3.1 (L) 3.5 - 5.0 g/dL   AST 35 15 - 41 U/L   ALT 23 0 - 44 U/L   Alkaline Phosphatase 84 38 - 126 U/L   Total Bilirubin 0.5 0.3 - 1.2 mg/dL   GFR, Estimated 45 (L) >60 mL/min    Comment: (NOTE) Calculated using the CKD-EPI Creatinine Equation (2021)    Anion gap 6 5 - 15    Comment: Performed at [facility], [address] [place], [place] [address]  CBC with Differential     Status: Abnormal   Collection  Time: 02/07/23  4:41 PM  Result Value Ref Range   WBC 6.8 4.0 - 10.5 K/uL   RBC 3.53 (L) 3.87 - 5.11 MIL/uL   Hemoglobin 10.6 (L) 12.0 - 15.0 g/dL   HCT 81.1 (L) 91.4 - 78.2 %   MCV 99.2 80.0 - 100.0 fL   MCH 30.0 26.0 - 34.0 pg   MCHC 30.3 30.0 - 36.0 g/dL   RDW 95.6 21.3 - 08.6 %   Platelets 223 150 - 400 K/uL   nRBC 0.0 0.0 - 0.2 %   Neutrophils Relative % 56 %   Neutro Abs 3.8 1.7 - 7.7 K/uL   Lymphocytes Relative 29 %   Lymphs Abs 1.9 0.7 - 4.0 K/uL   Monocytes Relative 13 %   Monocytes Absolute 0.9 0.1 - 1.0 K/uL   Eosinophils Relative 2 %   Eosinophils Absolute 0.1 0.0 - 0.5 K/uL   Basophils Relative 0 %   Basophils Absolute 0.0 0.0 - 0.1 K/uL   Immature Granulocytes 0 %   Abs Immature Granulocytes 0.02 0.00 - 0.07 K/uL    Comment: Performed at [facility], [address], [place], [place] [address]  Troponin I (High Sensitivity)     Status: Abnormal   Collection Time: 02/07/23  4:41 PM  Result Value Ref Range   Troponin I (High Sensitivity) 78 (H) <18 ng/L    Comment: (NOTE) Elevated high sensitivity troponin I (hsTnI) values and significant  changes across serial measurements [place] suggest ACS but many other  chronic and acute conditions are known to elevate hsTnI results.  Refer to the "Links" section for chest pain algorithms and additional  guidance. Performed at [facility], [address], [place], [place] [address]   Magnesium     Status:  None   Collection Time: 02/07/23  4:41 PM  Result Value Ref Range   Magnesium 1.9 1.7 - 2.4 mg/dL    Comment: Performed at [facility], [address] [place], [place] [address]  Brain natriuretic peptide     Status: Abnormal   Collection Time: 02/07/23  4:41 PM  Result Value Ref Range   B Natriuretic Peptide 473.9 (H) 0.0 - 100.0 pg/mL    Comment: Performed at [facility], [address], [place], [place] [address]  I-Stat venous blood gas, ED     Status: Abnormal   Collection Time: 02/07/23   5:26 PM  Result Value Ref Range   pH, Ven 7.301 7.25 - 7.43   pCO2, Ven 66.0 (H) 44 - 60 mmHg   pO2, Ven 197 (H) 32 - 45 mmHg   Bicarbonate 32.5 (H) 20.0 - 28.0 mmol/L   TCO2 35 (H) 22 - 32 mmol/L   O2 Saturation 100 %   Acid-Base Excess 4.0 (H) 0.0 - 2.0 mmol/L   Sodium 131 (L) 135 - 145 mmol/L   Potassium 4.2 3.5 - 5.1 mmol/L   Calcium, Ion 1.16 1.15 - 1.40 mmol/L   HCT 34.0 (L) 36.0 - 46.0 %   Hemoglobin 11.6 (L) 12.0 - 15.0 g/dL   Sample type VENOUS    Comment NOTIFIED PHYSICIAN   Troponin I (High Sensitivity)     Status: Abnormal   Collection Time: 02/07/23  6:30 PM  Result Value Ref Range   Troponin I (High Sensitivity) 67 (H) <18 ng/L    Comment: (NOTE) Elevated high sensitivity troponin I (hsTnI) values and significant  changes across serial measurements [place] suggest ACS but many other  chronic and acute conditions are known to elevate hsTnI results.  Refer to the "Links" section for chest pain algorithms and additional  guidance. Performed at [facility], [address], [place], [place] [address]    *Note: Due to a large number of results and/or encounters for the requested time period, some results have not been displayed. A complete set of results can be found in Results Review.   ECHOCARDIOGRAM COMPLETE  Result Date: 02/08/2023    ECHOCARDIOGRAM REPORT   Patient Name:   [name] Date of Exam: 02/08/2023 Medical Rec #:  [id number]  Height:       65.0 in Accession #:    [id number] Weight:       188.7 lb Date of Birth:  1935/05/19   BSA:          1.930 m Patient Age:    88 years   BP:           144/54 mmHg Patient Gender: F          HR:           67 bpm. Exam Location:  Inpatient Procedure: 2D Echo, Cardiac Doppler and Color Doppler Indications:    CHF - Acute Diastolic  History:        Patient has prior history of Echocardiogram examinations, most                 recent 03/17/2022. CHF and Hx of Takotsubo, CAD, COPD, Pulmonary                 HTN, TIA and Hx of cancer,  CKD, Arrythmias:Atrial Fibrillation,                 Signs/Symptoms:Dyspnea, Edema and Shortness of Breath; Risk  Factors:Hypertension and Dyslipidemia.  Sonographer:    Wallie Char Referring Phys: [id number] ROBERT DORRELL IMPRESSIONS  1. Left ventricular ejection fraction, by estimation, is 65 to 70%. The left ventricle has normal function. The left ventricle has no regional wall motion abnormalities. There is mild concentric left ventricular hypertrophy. Left ventricular diastolic function could not be evaluated.  2. Right ventricular systolic function is normal. The right ventricular size is normal. There is severely elevated pulmonary artery systolic pressure.  3. Left atrial size was severely dilated.  4. Right atrial size was severely dilated.  5. The mitral valve is normal in structure. Mild mitral valve regurgitation. No evidence of mitral stenosis. Moderate mitral annular calcification.  6. Tricuspid valve regurgitation is severe.  7. The aortic valve is normal in structure. There is severe calcifcation of the aortic valve. There is moderate thickening of the aortic valve. Aortic valve regurgitation is not visualized. Moderate aortic valve stenosis. Aortic valve mean gradient measures 15.8 mmHg. Aortic valve Vmax measures 2.59 m/s.  8. Aortic dilatation noted. There is borderline dilatation of the ascending aorta, measuring 38 mm.  9. The inferior vena cava is dilated in size with >50% respiratory variability, suggesting right atrial pressure of 8 mmHg. FINDINGS  Left Ventricle: Left ventricular ejection fraction, by estimation, is 65 to 70%. The left ventricle has normal function. The left ventricle has no regional wall motion abnormalities. The left ventricular internal cavity size was normal in size. There is  mild concentric left ventricular hypertrophy. Left ventricular diastolic function could not be evaluated due to atrial fibrillation. Left ventricular diastolic function could not be  evaluated. Right Ventricle: The right ventricular size is normal. No increase in right ventricular wall thickness. Right ventricular systolic function is normal. There is severely elevated pulmonary artery systolic pressure. The tricuspid regurgitant velocity is 4.54 m/s, and with an assumed right atrial pressure of 12 mmHg, the estimated right ventricular systolic pressure is 94.4 mmHg. Left Atrium: Left atrial size was severely dilated. Right Atrium: Right atrial size was severely dilated. Pericardium: There is no evidence of pericardial effusion. Mitral Valve: The mitral valve is normal in structure. Moderate mitral annular calcification. Mild mitral valve regurgitation. No evidence of mitral valve stenosis. MV peak gradient, 11.3 mmHg. The mean mitral valve gradient is 2.0 mmHg. Tricuspid Valve: The tricuspid valve is normal in structure. Tricuspid valve regurgitation is severe. No evidence of tricuspid stenosis. Aortic Valve: The aortic valve is normal in structure. There is severe calcifcation of the aortic valve. There is moderate thickening of the aortic valve. Aortic valve regurgitation is not visualized. Moderate aortic stenosis is present. Aortic valve mean gradient measures 15.8 mmHg. Aortic valve peak gradient measures 26.8 mmHg. Aortic valve area, by VTI measures 1.10 cm. Pulmonic Valve: The pulmonic valve was normal in structure. Pulmonic valve regurgitation is mild. No evidence of pulmonic stenosis. Aorta: Aortic dilatation noted. There is borderline dilatation of the ascending aorta, measuring 38 mm. Venous: The inferior vena cava is dilated in size with greater than 50% respiratory variability, suggesting right atrial pressure of 8 mmHg. IAS/Shunts: No atrial level shunt detected by color flow Doppler.  LEFT VENTRICLE PLAX 2D LVIDd:         4.30 cm     Diastology LVIDs:         2.90 cm     LV e' medial:    5.49 cm/s LV PW:         0.90 cm     LV E/e' medial:  29.1 LV IVS:        1.20 cm     LV e'  lateral:   4.41 cm/s LVOT diam:     1.60 cm     LV E/e' lateral: 36.3 LV SV:         64 LV SV Index:   33 LVOT Area:     2.01 cm  LV Volumes (MOD) LV vol d, MOD A2C: 85.0 ml LV vol d, MOD A4C: 63.8 ml LV vol s, MOD A2C: 26.3 ml LV vol s, MOD A4C: 22.2 ml LV SV MOD A2C:     58.7 ml LV SV MOD A4C:     63.8 ml LV SV MOD BP:      52.3 ml RIGHT VENTRICLE            IVC RV Basal diam:  4.40 cm    IVC diam: 2.60 cm RV S prime:     8.36 cm/s TAPSE (M-mode): 1.4 cm LEFT ATRIUM             Index        RIGHT ATRIUM           Index LA diam:        4.80 cm 2.49 cm/m   RA Area:     28.10 cm LA Vol (A2C):   98.4 ml 50.99 ml/m  RA Volume:   90.40 ml  46.84 ml/m LA Vol (A4C):   91.4 ml 47.36 ml/m LA Biplane Vol: 95.3 ml 49.38 ml/m  AORTIC VALVE AV Area (Vmax):    1.11 cm AV Area (Vmean):   1.06 cm AV Area (VTI):     1.10 cm AV Vmax:           259.00 cm/s AV Vmean:          188.000 cm/s AV VTI:            0.587 m AV Peak Grad:      26.8 mmHg AV Mean Grad:      15.8 mmHg LVOT Vmax:         143.50 cm/s LVOT Vmean:        99.550 cm/s LVOT VTI:          0.320 m LVOT/AV VTI ratio: 0.55  AORTA Ao Root diam: 3.70 cm Ao Asc diam:  3.80 cm MITRAL VALVE                TRICUSPID VALVE MV Area (PHT): 4.41 cm     TR Peak grad:   82.4 mmHg MV Area VTI:   1.58 cm     TR Mean grad:   52.0 mmHg MV Peak grad:  11.3 mmHg    TR Vmax:        454.00 cm/s MV Mean grad:  2.0 mmHg     TR Vmean:       341.0 cm/s MV Vmax:       1.68 m/s MV Vmean:      66.7 cm/s    SHUNTS MV Decel Time: 172 msec     Systemic VTI:  0.32 m MV E velocity: 160.00 cm/s  Systemic Diam: 1.60 cm Arvilla Meres MD Electronically signed by Arvilla Meres MD Signature Date/Time: 02/08/2023/9:34:27 AM    Final    CT Angio Chest Pulmonary Embolism (PE) W or WO Contrast  Result Date: 02/08/2023 CLINICAL DATA:  Shortness of breath. EXAM: CT ANGIOGRAPHY CHEST WITH CONTRAST TECHNIQUE: Multidetector CT imaging of the chest was performed using the  standard protocol during bolus  administration of intravenous contrast. Multiplanar CT image reconstructions and MIPs were obtained to evaluate the vascular anatomy. RADIATION DOSE REDUCTION: This exam was performed according to the departmental dose-optimization program which includes automated exposure control, adjustment of the mA and/or kV according to patient size and/or use of iterative reconstruction technique. CONTRAST:  75mL OMNIPAQUE IOHEXOL 350 MG/ML SOLN COMPARISON:  March 17, 2022 FINDINGS: Cardiovascular: There is marked severity calcification of the thoracic aorta. The ascending thoracic aorta measures 4.1 cm in diameter. Satisfactory opacification of the pulmonary arteries to the segmental level. No evidence of pulmonary embolism. There is mild cardiomegaly with moderate to marked severity coronary artery calcification. No pericardial effusion. Mediastinum/Nodes: No enlarged mediastinal, hilar, or axillary lymph nodes. A stable 2.8 cm x 2.0 cm heterogeneous thyroid nodule is seen within the inferior aspect of the left lobe of the thyroid gland. A smaller thyroid nodule is seen within the right lobe of the thyroid gland. The trachea and esophagus demonstrate no significant findings. Lungs/Pleura: Mild to moderate severity diffuse interstitial thickening is seen. A 5 mm calcified lung nodule is seen within the anterolateral aspect of the right upper lobe. Moderate to marked severity compressive atelectasis is noted within the bilateral lower lobes. Mild to moderate severity biapical scarring and/or atelectasis is also seen. A predominantly stable 8 mm x 9 mm lung nodule seen within the anteromedial aspect of the right middle lobe. There are bilateral moderate to large pleural effusions. No pneumothorax is identified. Upper Abdomen: There is a small hiatal hernia. Numerous punctate calcified granulomas are seen scattered throughout the spleen. Musculoskeletal: Multilevel degenerative changes seen throughout the thoracic spine. Prior  vertebroplasty is noted within the upper lumbar spine. Review of the MIP images confirms the above findings. IMPRESSION: 1. No evidence of pulmonary embolism. 2. Mild to moderate severity diffuse interstitial thickening which [place]cl[place] represent mild pulmonary edema. 3. Bilateral moderate to large pleural effusions with moderate to marked severity bilateral lower lobe compressive atelectasis. 4. Stable bilateral thyroid nodules. Correlation with follow-up nonemergent thyroid ultrasound is recommended. 5. Predominantly stable anteromedial right middle lobe lung nodule. Consider one of the following in 3 months for both low-risk and high-risk individuals: (a) repeat chest CT, (b) follow-up PET-CT, or (c) tissue sampling. This recommendation follows the consensus statement: Guidelines for Management of Incidental Pulmonary Nodules Detected on CT Images: From the Fleischner Society 2017; Radiology 2017; 284:228-243. 6. Small hiatal hernia. 7. Aortic atherosclerosis. Aortic Atherosclerosis (ICD10-I70.0). Electronically Signed   By: Aram Candela M.D.   On: 02/08/2023 03:19   DG Chest Port 1 View  Result Date: 02/07/2023 CLINICAL DATA:  Cough EXAM: PORTABLE CHEST 1 VIEW COMPARISON:  Chest x-ray 11/18/2022 FINDINGS: Patient is rotated. Heart is enlarged. There central pulmonary vascular congestion with additional interstitial opacities in the left lower lung. There is a small left pleural effusion. There is no pneumothorax or acute fracture. IMPRESSION: 1. Cardiomegaly with central pulmonary vascular congestion. 2. Interstitial opacities in the left lower lung [place] reflect asymmetric edema or infection. 3. Small left pleural effusion. Electronically Signed   By: Darliss Cheney M.D.   On: 02/07/2023 17:20    Pending Labs Unresulted Labs (From admission, onward)     Start     Ordered   02/07/23 2000  Protime-INR  Once,   R        02/07/23 2000            Vitals/Pain Today's Vitals   02/08/23 1145 02/08/23  1147 02/08/23  1148 02/08/23 1215  BP: (!) 125/42   (!) 136/45  Pulse: (!) 53   67  Resp: 16   19  Temp:  99.1 F (37.3 C)    TempSrc:  Oral    SpO2: 100%   99%  Weight:      Height:      PainSc:   0-No pain     Isolation Precautions No active isolations  Medications Medications  amLODipine (NORVASC) tablet 5 mg (5 mg Oral Given 02/08/23 0904)  cloNIDine (CATAPRES) tablet 0.2 mg (0.2 mg Oral Given 02/08/23 0905)  ranolazine (RANEXA) 12 hr tablet 500 mg (has no administration in time range)  famotidine (PEPCID) tablet 20 mg (has no administration in time range)  ondansetron (ZOFRAN-ODT) disintegrating tablet 4 mg (has no administration in time range)  pantoprazole (PROTONIX) EC tablet 40 mg (40 mg Oral Given 02/08/23 0905)  apixaban (ELIQUIS) tablet 5 mg (5 mg Oral Given 02/08/23 0905)  sodium chloride tablet 2 g (2 g Oral Given 02/08/23 0904)  acetaminophen (TYLENOL) tablet 650 mg (has no administration in time range)    Or  acetaminophen (TYLENOL) suppository 650 mg (has no administration in time range)  oxyCODONE (Oxy IR/ROXICODONE) immediate release tablet 5 mg (5 mg Oral Given 02/08/23 1053)  ondansetron (ZOFRAN) tablet 4 mg (has no administration in time range)    Or  ondansetron (ZOFRAN) injection 4 mg (has no administration in time range)  furosemide (LASIX) injection 40 mg (40 mg Intravenous Given 02/07/23 1659)  furosemide (LASIX) injection 40 mg (40 mg Intravenous Given 02/07/23 1901)  furosemide (LASIX) injection 40 mg (40 mg Intravenous Given 02/08/23 0638)  methylPREDNISolone sodium succinate (SOLU-MEDROL) 40 mg/mL injection 40 mg (40 mg Intravenous Given 02/07/23 2307)  diphenhydrAMINE (BENADRYL) capsule 50 mg ( Oral See Alternative 02/08/23 0208)    Or  diphenhydrAMINE (BENADRYL) injection 50 mg (50 mg Intravenous Given 02/08/23 0208)  furosemide (LASIX) injection 80 mg (80 mg Intravenous Given 02/08/23 0132)  iohexol (OMNIPAQUE) 350 MG/ML injection 75 mL (75 mLs Intravenous Contrast  Given 02/08/23 0305)    Mobility non-ambulatory     Focused Assessments Neuro Assessment Handoff:  Swallow screen pass? Yes  Cardiac Rhythm: Atrial fibrillation       Neuro Assessment: Exceptions to WDL Neuro Checks:      Has TPA been given? No If patient is a Neuro Trauma and patient is going to OR before floor call report to 4N Charge nurse: [phone] or [phone]   R Recommendations: See Admitting Provider Note  Report given to:   Additional Notes: pt is AAOx4. Pt is on 2L [place].

## 2023-02-08 NOTE — Progress Notes (Signed)
Heart Failure Navigator Progress Note  Assessed for Heart & Vascular TOC clinic readiness.  Patient does not meet criteria due to Advanced Heart Failure Team patient of Dr. McLean.   Navigator will sign off at this time.   Bentlee Benningfield, BSN, RN Heart Failure Nurse Navigator Secure Chat Only   

## 2023-02-08 NOTE — Assessment & Plan Note (Signed)
No chest pain, continue blood pressure control.  

## 2023-02-08 NOTE — Assessment & Plan Note (Addendum)
Continue blood pressure control with amlodipine and clonidine. Possible transition to ARB during this hospitalization.  Continue diuresis with IV furosemide.

## 2023-02-09 ENCOUNTER — Ambulatory Visit: Payer: Medicare Other | Admitting: Podiatry

## 2023-02-09 DIAGNOSIS — I5033 Acute on chronic diastolic (congestive) heart failure: Secondary | ICD-10-CM | POA: Diagnosis not present

## 2023-02-09 LAB — BASIC METABOLIC PANEL
Anion gap: 6 (ref 5–15)
BUN: 32 mg/dL — ABNORMAL HIGH (ref 8–23)
CO2: 33 mmol/L — ABNORMAL HIGH (ref 22–32)
Calcium: 8.9 mg/dL (ref 8.9–10.3)
Chloride: 96 mmol/L — ABNORMAL LOW (ref 98–111)
Creatinine, Ser: 1.38 mg/dL — ABNORMAL HIGH (ref 0.44–1.00)
GFR, Estimated: 37 mL/min — ABNORMAL LOW (ref >60)
Glucose, Bld: 112 mg/dL — ABNORMAL HIGH (ref 70–99)
Potassium: 4.6 mmol/L (ref 3.5–5.1)
Sodium: 135 mmol/L (ref 135–145)

## 2023-02-09 LAB — MAGNESIUM: Magnesium: 2.1 mg/dL (ref 1.7–2.4)

## 2023-02-09 MED ORDER — FUROSEMIDE 10 MG/ML IJ SOLN
80.0000 mg | Freq: Two times a day (BID) | INTRAMUSCULAR | Status: DC
  Administered 2023-02-09 – 2023-02-11 (×5): 80 mg via INTRAVENOUS
  Filled 2023-02-09 (×8): qty 8

## 2023-02-09 MED ORDER — SPIRONOLACTONE 25 MG PO TABS: 25.0000 mg | ORAL_TABLET | Freq: Every day | ORAL | Status: DC

## 2023-02-09 MED ORDER — CHLORHEXIDINE GLUCONATE CLOTH 2 % EX PADS
6.0000 | MEDICATED_PAD | Freq: Every day | CUTANEOUS | Status: DC
  Administered 2023-02-08 – 2023-02-16 (×9): 6 via TOPICAL

## 2023-02-09 MED ORDER — HYDRALAZINE HCL 20 MG/ML IJ SOLN
10.0000 mg | Freq: Four times a day (QID) | INTRAMUSCULAR | Status: DC | PRN
  Administered 2023-02-09 – 2023-02-10 (×2): 10 mg via INTRAVENOUS
  Filled 2023-02-09 (×3): qty 1

## 2023-02-09 MED ORDER — SPIRONOLACTONE 12.5 MG HALF TABLET
12.5000 mg | ORAL_TABLET | Freq: Every day | ORAL | Status: DC
  Administered 2023-02-10 – 2023-02-12 (×3): 12.5 mg via ORAL
  Filled 2023-02-09 (×3): qty 1

## 2023-02-09 MED ORDER — FUROSEMIDE 10 MG/ML IJ SOLN: 80.0000 mg | Freq: Two times a day (BID) | INTRAMUSCULAR | Status: DC

## 2023-02-09 MED ORDER — AMLODIPINE BESYLATE 10 MG PO TABS
10.0000 mg | ORAL_TABLET | Freq: Every day | ORAL | Status: DC
  Administered 2023-02-10 – 2023-02-16 (×7): 10 mg via ORAL
  Filled 2023-02-09 (×7): qty 1

## 2023-02-09 MED ORDER — AMLODIPINE BESYLATE 5 MG PO TABS
5.0000 mg | ORAL_TABLET | Freq: Once | ORAL | Status: AC
  Administered 2023-02-09: 5 mg via ORAL
  Filled 2023-02-09: qty 1

## 2023-02-09 NOTE — Consult Note (Addendum)
Advanced Heart Failure Team Consult Note   Primary Physician: Donita Brooks, MD PCP-Cardiologist:  Armanda Magic, MD  Reason for Consultation: acute on chronic diastolic heart failure  HPI:    [name] is seen today for evaluation of acute on chronic diastolic heart failure at the request of Dr. Jomarie Longs, [facility] medicine.   88 y.o. with history of poorly controlled HTN, chronic atrial fibrillation, chronic diastolic CHF, and prior episode of Takotsubo cardiomyopathy.   Patient had exertional dyspnea since 11/16.  At that time, she presented to the hospital with chest pain.  Coronary angiography showed no significant CAD.  EF was 35-40% by echo, suspected Takotsubo cardiomyopathy.  Her echo in 3/17 showed EF improved to 60-65% but PA systolic pressure elevated at 65 mmHg.     RHC/LHC in 4/22 showed nonobstructive CAD; mean RA 5, PA 44/10, mean PCWP 20, CI 4.89, PVR < 1 WU.  Echo in 8/22 showed EF up to 65-70%, mild LVH, normal RV, PASP 67, moderate RAE, trivial MR. PYP scan in 9/22 was probably negative.    Patient was admitted in 7/23 with chest pain, she had a mild elevation in troponin with no trend.  This was thought to be due to demand ischemia with volume overload rather than ACS.  Echo this admission showed EF 60-65%, mild LVH, moderate RV enlargement, normal RV systolic function, PASP 50 mmHg, mild mitral stenosis with mean gradient 4 mmHg, mild-moderate AS, IVC normal.    7 day Zio 11/23 showed atrial fibrillation, average HR 54 bpm with 2 3-second pauses.   Admitted 3/24 with spinal compression fx. Found to be hyponatremic, with sodium 121 on admission. Initially given IV lasix then SCr elevated and received IVF. IR consulted and she underwent L3 and L4 balloon kyphoplasty. She was discharged to SNF, weight 168 lbs.  Admitted 5/4-5/13/24 after mechanical fall, no symptoms prior to fall. CT pelvis showed new L5 central compression fracture. CT right femur showed acute  nondisplaced fracture. Ortho consulted. Recommended TLSO bracing and conservative measures. Hospitalization complicated by hyperkalemia, received lokelma, IV insulin/D50. GI also consulted with progressive anemia, EGD 01/15/23 w/ AVM First portion of duodenum s/p argon plasma coagulation. SBE 01/16/2023 with no significant findings. Discharge weight 188lbs.   Presented to ED via EMS. Has had progressive SOB and LLE edema. Initial sats in 80s on room air, improved with O2 via [place]. BNP 434. Echo 02/08/23: EF 65/70%, mild concentric LVH, normal RV, severely elevated PASP, LA/RA severely dilated, mos aortic stenosis. CT chest, no pulmonary embolism, bilateral faint ground glass opacities, bilateral pleural effusions. Has been diuresing with IV lasix. Not much UOP documented.   Ms. Pavlak was recently in rehab for about 14 days, then home for 2 and a half days. While at rehab she continued to feel progressively SOB requiring diuretic titration. Once at home she got significantly SOB and BLE edema worsened. Sats checked at home and were in the 60s/70s, EMS called and brought patient to ED. She is up 21 lbs since last AHF f/u 4/30. Son at bedside. Reports full compliance with meds, had been taking torsemide 40am/20pm. Denies CP.    Home Medications Prior to Admission medications   Medication Sig Start Date End Date Taking? Authorizing Provider  acetaminophen (TYLENOL) 325 MG tablet Take 2 tablets (650 mg total) by mouth every 6 (six) hours as needed for moderate pain. 01/17/23  Yes [place], Eric J, DO  albuterol (VENTOLIN HFA) 108 (90 Base) MCG/ACT inhaler Inhale 2  puffs into the lungs every 6 (six) hours as needed for wheezing or shortness of breath. 10/27/22  Yes [place], [facility], FNP  amLODipine (NORVASC) 5 MG tablet Take 5 mg by mouth daily.   Yes [provider]  apixaban (ELIQUIS) 5 MG TABS tablet Take 1 tablet (5 mg total) by mouth 2 (two) times daily. 11/24/22  Yes Almon Hercules, MD  bisacodyl (DULCOLAX)  10 MG suppository Place 1 suppository (10 mg total) rectally as needed for moderate constipation. 12/24/22  Yes Donita Brooks, MD  budesonide-formoterol (SYMBICORT) 80-4.5 MCG/ACT inhaler Inhale 2 puffs into the lungs in the morning and at bedtime. Patient taking differently: Inhale 2 puffs into the lungs See admin instructions. 2 puffs every morning, Space take an additional 2 puffs later in the day if needed for wheezing, shortness of breath. 11/15/22  Yes Dgayli, Lianne Bushy, MD  clonazePAM (KLONOPIN) 0.5 MG tablet Take 1 tablet (0.5 mg total) by mouth 2 (two) times daily as needed. for anxiety Patient taking differently: Take 0.5 mg by mouth at bedtime as needed for anxiety. for anxiety 01/17/23  Yes [place], Alvira Philips, DO  cloNIDine (CATAPRES) 0.2 MG tablet Take 1 tablet (0.2 mg total) by mouth 2 (two) times daily. 01/04/23  Yes [place], Anderson Malta, FNP  doxycycline (MONODOX) 100 MG capsule Take 100 mg by mouth 2 (two) times daily.   Yes [provider]  famotidine (PEPCID) 20 MG tablet Take 20 mg by mouth daily as needed for heartburn.   Yes [provider]  ferrous sulfate 325 (65 FE) MG tablet Take 1 tablet (325 mg total) by mouth daily with breakfast. 07/04/20  Yes Donita Brooks, MD  fluticasone (FLONASE) 50 MCG/ACT nasal spray Place 1 spray into both nostrils daily as needed for allergies or rhinitis.   Yes [provider]  methocarbamol (ROBAXIN) 500 MG tablet Take 1 tablet (500 mg total) by mouth every 6 (six) hours as needed for muscle spasms. 01/06/23  Yes Donita Brooks, MD  nitroGLYCERIN (NITROSTAT) 0.4 MG SL tablet DISSOLVE ONE TABLET UNDER THE TONGUE EVERY 5 MINUTES AS NEEDED FOR CHEST PAIN.  DO NOT EXCEED A TOTAL OF 3 DOSES IN 15 MINUTES Patient taking differently: Place 0.4 mg under the tongue See admin instructions. DISSOLVE ONE TABLET UNDER THE TONGUE EVERY 5 MINUTES AS NEEDED FOR CHEST PAIN.  DO NOT EXCEED A TOTAL OF 3 DOSES IN 15 MINUTES 11/06/21  Yes Turner,  Cornelious Bryant, MD  ondansetron (ZOFRAN-ODT) 4 MG disintegrating tablet Take 1 tablet (4 mg total) by mouth every 8 (eight) hours as needed for nausea or vomiting. 12/17/22  Yes Donita Brooks, MD  oxyCODONE-acetaminophen (PERCOCET/ROXICET) 5-325 MG tablet Take 1 tablet by mouth every 6 (six) hours as needed for moderate pain. 01/17/23  Yes [place], Eric J, DO  polyethylene glycol powder (MIRALAX) 17 GM/SCOOP powder Take 17 g by mouth 2 (two) times daily as needed for mild constipation. 11/24/22  Yes Almon Hercules, MD  ranolazine (RANEXA) 500 MG 12 hr tablet Take 500 mg by mouth daily as needed (chest pain).   Yes [provider]  senna-docusate (SENOKOT-S) 8.6-50 MG tablet Take 2 tablets by mouth 2 (two) times daily between meals as needed for moderate constipation. Patient taking differently: Take 2 tablets by mouth every 12 (twelve) hours as needed for moderate constipation. 11/24/22  Yes Almon Hercules, MD  sodium chloride 1 g tablet Take 2 tablets (2 g total) by mouth 2 (two) times daily  with a meal. 01/17/23 02/16/23 Yes [place], Alvira Philips, DO  torsemide (DEMADEX) 20 MG tablet Take 1 tablet (20 mg total) by mouth daily. 01/17/23 02/16/23 Yes [place], Eric J, DO  Torsemide 40 MG TABS Take 40 mg by mouth daily.   Yes [provider]  Vitamin D, Ergocalciferol, (DRISDOL) 1.25 MG (50000 UNIT) CAPS capsule Take 1 capsule (50,000 Units total) by mouth every 7 (seven) days. 01/17/23  Yes [place], Eric J, DO  pantoprazole (PROTONIX) 40 MG tablet Take 1 tablet (40 mg total) by mouth daily. Patient not taking: Reported on 02/07/2023 01/17/23   [place], Eric J, DO    Past Medical History: Past Medical History:  Diagnosis Date   Allergy    rhinitis   Aortic stenosis    mild by echo 06/2017   Arthritis    Bradycardia    a. 10/2017 -> beta blocker cut back due to HR 39.   Breast cancer (HCC) 01/06/2012   Cancer (HCC)    right colon and left breast   Chronic diastolic CHF (congestive heart failure)  (HCC)    Colon cancer (HCC) 01/06/2012   Colovesical fistula    Dr. Carolynne Edouard and Dr. Laverle Patter planning surgery 08/2018- surgery revealed spontaneous closure   COPD (chronic obstructive pulmonary disease) (HCC)    pt. denies   Coronary artery disease 2006   a.  NSTEMI in 2016, cath showed 15% prox-mid RCA, 20% prox LAD, EF 25-35% by cath and 35-40% -> felt due to Takotsubo cardiomyopathy.   Dilated aortic root (HCC)    by echo 06/2017   Diverticulosis    Dyspnea    Edema extremities    GERD (gastroesophageal reflux disease)    Hernia    Hiatal hernia    denies   Hyperlipidemia    Hypertension    Mild aortic stenosis    echo 11/2015 but not noted on echo 06/2016   Osteopenia    Permanent atrial fibrillation (HCC)    chronic atrial fibrillation   Pneumonia    hx child   Pulmonary HTN (HCC)    a. moderate to severe PASP echo 11/2015 - now by echo 06/2017. CTA chest in 11/16 with no PE. PFTs in 7/15 with mild obstructive lung disease. She had a negative sleep study in 2017. b. Felt due to left sided HF.   Stroke [facility])    Takotsubo syndrome 07/29/2015   a. EF 35-40% by echo; akinesis of mid-apical anteroseptal and apical myocardium.  EF now normalized on echo 11/2015    Past Surgical History: Past Surgical History:  Procedure Laterality Date   APPENDECTOMY     BIOPSY  01/15/2023   Procedure: BIOPSY;  Surgeon: Lynann Bologna, DO;  Location: [facility] ENDOSCOPY;  Service: Gastroenterology;;   BREAST SURGERY     lumpectomy left   CARDIAC CATHETERIZATION     CARDIAC CATHETERIZATION N/A 07/28/2015   Procedure: Left Heart Cath and Coronary Angiography;  Surgeon: Peter M Swaziland, MD;  Location: [facility] INVASIVE CV LAB;  Service: Cardiovascular;  Laterality: N/A;   CHOLECYSTECTOMY     COLECTOMY     right side   CYSTOSCOPY WITH STENT PLACEMENT Left 09/04/2018   Procedure: CYSTOSCOPY WITH LEFT STENT PLACEMENT, BLADDER REPAIR, CYSTOSCOPY WITH LEFT STENT REMOVAL;  Surgeon: Heloise Purpura, MD;  Location: [facility] ORS;  Service: Urology;  Laterality: Left;   ENTEROSCOPY N/A 01/16/2023   Procedure: ENTEROSCOPY;  Surgeon: Lynann Bologna, DO;  Location: [facility] ENDOSCOPY;  Service: Gastroenterology;  Laterality:  N/A;   ESOPHAGOGASTRODUODENOSCOPY (EGD) WITH PROPOFOL N/A 01/15/2023   Procedure: ESOPHAGOGASTRODUODENOSCOPY (EGD) WITH PROPOFOL;  Surgeon: Lynann Bologna, DO;  Location: [facility] ENDOSCOPY;  Service: Gastroenterology;  Laterality: N/A;   EXCISION OF ACCESSORY NIPPLE Bilateral 05/30/2013   Procedure: BILATERAL NIPPLE BIOPSY;  Surgeon: Robyne Askew, MD;  Location: [facility] OR;  Service: General;  Laterality: Bilateral;   EYE SURGERY Bilateral 12   cataracts   HOT HEMOSTASIS N/A 01/15/2023   Procedure: HOT HEMOSTASIS (ARGON PLASMA COAGULATION/BICAP);  Surgeon: Lynann Bologna, DO;  Location: [facility];  Service: Gastroenterology;  Laterality: N/A;   HYSTEROSCOPY WITH D & C N/A 05/29/2020   Procedure: DILATATION AND CURETTAGE /HYSTEROSCOPY;  Surgeon: Natale Milch, MD;  Location: [facility] ORS;  Service: Gynecology;  Laterality: N/A;   IR KYPHO EA ADDL LEVEL THORACIC OR LUMBAR  11/22/2022   IR KYPHO LUMBAR INC FX REDUCE BONE BX UNI/BIL CANNULATION INC/IMAGING  11/22/2022   IR RADIOLOGIST EVAL & MGMT  07/26/2017   LAPAROSCOPIC RIGHT COLECTOMY N/A 09/04/2018   Procedure: LAPAROSCOPIC ASSISTED SIGMOID COLECTOMY WITH REPAIR OF FISTULA TO BLADDER;  Surgeon: Griselda Miner, MD;  Location: [facility] ORS;  Service: General;  Laterality: N/A;   RIGHT/LEFT HEART CATH AND CORONARY ANGIOGRAPHY N/A 12/10/2020   Procedure: RIGHT/LEFT HEART CATH AND CORONARY ANGIOGRAPHY;  Surgeon: Laurey Morale, MD;  Location: [facility] INVASIVE CV LAB;  Service: Cardiovascular;  Laterality: N/A;   SPLIT NIGHT STUDY  02/02/2016        Family History: Family History  Problem Relation Age of Onset   Heart disease Mother    Heart attack Mother    Cancer Sister        stomach and colon   Heart disease Brother 67    Hypertension Father    Cancer Sister    Stroke Neg Hx     Social History: Social History   Socioeconomic History   Marital status: Widowed    Spouse name: Not on file   Number of children: 1   Years of education: 26   Highest education level: 12th grade  Occupational History   Not on file  Tobacco Use   Smoking status: Never    Passive exposure: Never   Smokeless tobacco: Never  Vaping Use   Vaping Use: Never used  Substance and Sexual Activity   Alcohol use: No   Drug use: No   Sexual activity: Not Currently    Partners: Male  Other Topics Concern   Not on file  Social History Narrative   Husband [name], passed away in 2022/01/20. Pt's son, Loletta Specter lives beside of patient and helps with her care.    Social Determinants of Health   Financial Resource Strain: Low Risk  (11/08/2022)   Overall Financial Resource Strain (CARDIA)    Difficulty of Paying Living Expenses: Not very hard  Recent Concern: Financial Resource Strain - Medium Risk (11/04/2022)   Overall Financial Resource Strain (CARDIA)    Difficulty of Paying Living Expenses: Somewhat hard  Food Insecurity: No Food Insecurity (02/08/2023)   Hunger Vital Sign    Worried About Running Out of Food in the Last Year: Never true    Ran Out of Food in the Last Year: Never true  Transportation Needs: No Transportation Needs (02/08/2023)   PRAPARE - Administrator, Civil Service (Medical): No    Lack of Transportation (Non-Medical): No  Physical Activity: Sufficiently Active (11/08/2022)   Exercise Vital Sign    Days  of Exercise per Week: 5 days    Minutes of Exercise per Session: 30 min  Recent Concern: Physical Activity - Insufficiently Active (08/11/2022)   Exercise Vital Sign    Days of Exercise per Week: 5 days    Minutes of Exercise per Session: 10 min  Stress: No Stress Concern Present (11/08/2022)   [employer] of Occupational Health - Occupational Stress Questionnaire    Feeling of Stress :  Only a little  Social Connections: Moderately Integrated (11/08/2022)   Social Connection and Isolation Panel [NHANES]    Frequency of Communication with Friends and Family: More than three times a week    Frequency of Social Gatherings with Friends and Family: More than three times a week    Attends Religious Services: More than 4 times per year    Active Member of [employer] or Organizations: Yes    Attends Banker Meetings: More than 4 times per year    Marital Status: Widowed  Recent Concern: Social Connections - Moderately Isolated (08/11/2022)   Social Connection and Isolation Panel [NHANES]    Frequency of Communication with Friends and Family: More than three times a week    Frequency of Social Gatherings with Friends and Family: Twice a week    Attends Religious Services: More than 4 times per year    Active Member of [employer] or Organizations: No    Attends Banker Meetings: Never    Marital Status: Widowed    Allergies:  Allergies  Allergen Reactions   Contrast Media [Iodinated Contrast Media] Hives and Other (See Comments)    Chest pain and burning    Clonidine Derivatives Other (See Comments)    Dry mouth, throat   Statins Other (See Comments)    Myalgias   Sulfa Antibiotics Diarrhea    Tremors    Celebrex [Celecoxib] Rash   Isordil [Isosorbide Nitrate] Itching and Rash   Tape Itching and Rash    Redness at application site Reaction is to paper and plastic tapes and EKG leads    Objective:    Vital Signs:   Temp:  [97.9 F (36.6 C)-99.1 F (37.3 C)] 98.6 F (37 C) (06/05 1052) Pulse Rate:  [50-78] 78 (06/05 1052) Resp:  [13-26] 17 (06/05 1052) BP: (115-182)/(42-68) 182/54 (06/05 1052) SpO2:  [91 %-100 %] 93 % (06/05 1052) Weight:  [78.9 kg-83.1 kg] 83.1 kg (06/05 0457) Last BM Date : 02/06/23  Weight change: Filed Weights   02/07/23 1654 02/08/23 1353 02/09/23 0457  Weight: 85.6 kg 78.9 kg 83.1 kg     Intake/Output:   Intake/Output Summary (Last 24 hours) at 02/09/2023 1124 Last data filed at 02/09/2023 0821 Gross per 24 hour  Intake 720 ml  Output 750 ml  Net -30 ml    Physical Exam  General:  chronically ill appearing.  No respiratory difficulty HEENT: normal Neck: supple. JVD difficult to see, appears elevated. Carotids 2+ bilat; no bruits. No lymphadenopathy or thyromegaly appreciated. Cor: PMI nondisplaced. Regular rate & irregular rhythm. No rubs, gallops or murmurs. Lungs: diminished bases Abdomen: soft, nontender, nondistended. No hepatosplenomegaly. No bruits or masses. Good bowel sounds. Extremities: no cyanosis, clubbing, rash, +1 BLE edema. + ted hose Neuro: alert & oriented x 3, cranial nerves grossly intact. moves all 4 extremities w/o difficulty. Affect pleasant.   Telemetry   A fib 70s (Personally reviewed)    EKG    Reviewed from 6/3. A fib 63 bpm  Labs   Basic Metabolic Panel: Recent  Labs  Lab 02/07/23 1641 02/07/23 1726 02/08/23 1729 02/09/23 0106  NA 131* 131* 132* 135  K 4.1 4.2 4.7 4.6  CL 94*  --  93* 96*  CO2 31  --  31 33*  GLUCOSE 134*  --  144* 112*  BUN 22  --  29* 32*  CREATININE 1.17*  --  1.36* 1.38*  CALCIUM 8.8*  --  9.0 8.9  MG 1.9  --   --  2.1    Liver Function Tests: Recent Labs  Lab 02/07/23 1641  AST 35  ALT 23  ALKPHOS 84  BILITOT 0.5  PROT 6.1*  ALBUMIN 3.1*   No results for input(s): "LIPASE", "AMYLASE" in the last 168 hours. No results for input(s): "AMMONIA" in the last 168 hours.  CBC: Recent Labs  Lab 02/07/23 1641 02/07/23 1726  WBC 6.8  --   NEUTROABS 3.8  --   HGB 10.6* 11.6*  HCT 35.0* 34.0*  MCV 99.2  --   PLT 223  --     Cardiac Enzymes: No results for input(s): "CKTOTAL", "CKMB", "CKMBINDEX", "TROPONINI" in the last 168 hours.  BNP: BNP (last 3 results) Recent Labs    11/18/22 0635 12/14/22 1246 02/07/23 1641  BNP 136.1* 117.9* 473.9*    ProBNP (last 3 results) No results  for input(s): "PROBNP" in the last 8760 hours.   CBG: No results for input(s): "GLUCAP" in the last 168 hours.  Coagulation Studies: Recent Labs    02/08/23 1421  LABPROT 13.2  INR 1.0     Imaging   No results found.   Medications:     Current Medications:  amLODipine  5 mg Oral Daily   apixaban  5 mg Oral BID   Chlorhexidine Gluconate Cloth  6 each Topical Daily   cloNIDine  0.2 mg Oral BID   furosemide  80 mg Intravenous Q12H   ipratropium-albuterol  3 mL Nebulization Q6H   pantoprazole  40 mg Oral Daily   sodium chloride  2 g Oral BID WC   spironolactone  12.5 mg Oral Daily    Infusions:     Patient Profile  88 y.o. with history of poorly controlled HTN, chronic atrial fibrillation, chronic diastolic CHF, and prior episode of Takotsubo cardiomyopathy. AHF team asked to see for acute on chronic systolic heart failure.  Assessment/Plan  1. Chronic diastolic CHF: NYHA class III symptoms, slowly progressive over time.  Mild volume overload on exam.  RHC/LHC in 4/22 showed nonobstructive CAD; mean RA 5, PA 44/10, mean PCWP 20, CI 4.89, PVR < 1 WU (pulmonary venous hypertension).  Echo in 8/22 showed EF up to 65-70%, mild LVH, normal RV, PASP 67, moderate RAE, trivial MR. PYP scan in 9/22 was probably negative. Echo in 7/23 showed EF 60-65%, mild LVH, moderate RV enlargement, normal RV systolic function, PASP 50 mmHg, mild mitral stenosis with mean gradient 4 mmHg, mild-moderate AS, IVC normal. NYHA class III-IV symptoms, weight up 21 lbs since 01/04/23.  - echo 02/08/23: EF 65/70%, mild concentric LVH, normal RV, severely elevated PASP, LA/RA severely dilated, mos aortic stenosis - ReDs clip 29% however weight up. Suspect inaccurate. Continue diuresis with 80 IV lasix BID.  - Off spiro and losartan with hyperkalemia hx, spiro started this admission by primary, will continue for now. Would not increase, low threshold to stop.  - off BB with bradycardia - Unable to tolerate  Jardiance due to frequent UTIs.  - strict I&O, daily weights  - she has declined  Cardiomems 2.  CAD: Nonobstructive on 4/22 cath. She has been intolerant of statins and Zetia.   She has had atypical chest pain in the past. Admission 7/23 with CP, elevated HsT, CT ruled out acute aortic syndrome, echo ok. Pain felt to be related to coronary microvascular dysfunction.   - No ASA given apixaban use.  - Intolerant of statins and Zetia 3. Chronic atrial fibrillation/bradycardia: She is off nodal blockade. - Continue Eliquis 5 mg bid 4. HTN: BP elevated - Off spironolactone and losartan with hyperkalemia - Increase amlodipine 5>10 mg daily - Continue Clonidine 0.2 mg BID 5. Aortic stenosis: Mild-moderate on last echo.  - mod on echo yesterday  6. Bilateral pleural effusions -  CT chest 02/08/23: Bilateral moderate to large pleural effusions with moderate to marked severity bilateral lower lobe compressive atelectasis. - contributing to SOB - thoracentesis? 7. Physical Deconditioning - recent fall with closed compression fracture of L5 vertebra. - Continue working with PT/OT 8. Hx of Hyperkalemia - stable 4.6 today - spiro started this admission, would be very careful with history of hyperkalemia. Would continue current dose of 12.5 and not increase. Low threshold to stop.  - daily BMET   Length of Stay: 1  Alen Bleacher, NP  02/09/2023, 11:24 AM  Advanced Heart Failure Team Pager [phone] (M-F; 7a - 5p)  Please contact [facility] for night-coverage after hours (4p -7a ) and weekends on [email or web address]  Patient seen with NP, agree with the above note.   Patient had been at a SNF for about 2 wks after admission with fall and compression fractures/hip fracture.  Torsemide had been cut back.  She gained significant weight and developed dyspnea.  We had her increase her torsemide recently but dyspnea progressed and she was admitted.   General: NAD Neck: JVP 14 cm, no thyromegaly or thyroid  nodule.  Lungs: Clear to auscultation bilaterally with normal respiratory effort. CV: Nondisplaced PMI.  Heart irregular S1/S2, no S3/S4, 2/6 SEM RUSB.  2+ edema to knees.  No carotid bruit.  Normal pedal pulses.  Abdomen: Soft, nontender, no hepatosplenomegaly, no distention.  Skin: Intact without lesions or rashes.  Neurologic: Alert and oriented x 3.  Psych: Normal affect. Extremities: No clubbing or cyanosis.  HEENT: Normal.   Acute on chronic diastolic CHF in setting of cutting back diuretic after recent admission. Echo this admission with EF 65-70%, mild LVH, normal RV, mild MR, moderate AS with mean gradient 16 mmHg.  CTA chest negative for PE, showed mod-large bilateral effusions. Creatinine stable 1.38.  - Lasix 80 mg IV bid.  - Continue spironolactone 12.5 daily, would not increase with prior history of hyperkalemia on spironolactone.  - Will need to arrange for thoracentesis.   - Had workup for cardiac amyloidosis in the past with equivocal PYP scan, would repeat PYP scan in the future.   Increased amlodipine to 10 mg daily with HTN.   Marca Ancona 02/09/2023 4:55 PM

## 2023-02-09 NOTE — Consult Note (Signed)
   [facility] Inpatient Consult   02/09/2023  [name] [birth year] [id number]  [facility] [[facility]]  Accountable Care Organization [ACO] Patient:  [name] [facility] Medicare  Primary Care Provider:  Donita Brooks, MD, [facility]   Patient is currently active with [facility] [[facility]] Care Management for chronic disease management services.  Patient has been engaged by a [facility] LCSW.  Our community based plan of care has focused on disease management and community resource support.    Plan: Updated Inpatient Transition Of Care [TOC] team member to make aware that [facility] Care Management following in morning unit progression meeting.   [facility] LCSW, Mardene Celeste aware of hospitalization noted with appointment noted. Will update if new community needs arises.   Of note, [facility] Care Management services does not replace or interfere with any services that are needed or arranged by inpatient [facility] care management team.   For additional questions or referrals please contact:  Charlesetta Shanks, RN BSN CCM [name]  [phone] business mobile phone Toll free office [phone]  *Concierge Line  [phone] Fax number: [phone] [email or web address]

## 2023-02-09 NOTE — Evaluation (Signed)
Physical Therapy Evaluation Patient Details Name: [name] MRN: [id number] DOB: [name] 18, 1936 Today's Date: 02/09/2023  History of Present Illness  Pt admitted 6/4 from home, after being discharged from SNF 3 days prior, with hypoxia and edema in abdomen and LEs secondary to CHF exacerbation.  Pt with hx of: lumbar compression fractures, R fib head fx treated conservatively, HOH, CHF, colon CA, breast CA, CAD, COPD, CVA, afib.  Clinical Impression  Pt presents with admitting diagnosis above. Co treat with OT. Pt reports that she had just recently been DC from SNF 3 days ago and was living at home alone ambulating Mod I with RW. Pt reported that her son lives next door and has been helping out with ADLs. Today pt requires +2 Min A for all mobility with RW. Recommend HHPT if pt is able to progress to supervision level as pt family cannot provide 24 hour assistance. PT will continue to follow.     Recommendations for follow up therapy are one component of a multi-disciplinary discharge planning process, led by the attending physician.  Recommendations [name] be updated based on patient status, additional functional criteria and insurance authorization.  Follow Up Recommendations Can patient physically be transported by private vehicle: No     Assistance Recommended at Discharge Intermittent Supervision/Assistance  Patient can return home with the following  Two people to help with walking and/or transfers;A lot of help with bathing/dressing/bathroom;Assist for transportation;Help with stairs or ramp for entrance;Assistance with cooking/housework    Equipment Recommendations None recommended by PT  Recommendations for Other Services       Functional Status Assessment Patient has had a recent decline in their functional status and demonstrates the ability to make significant improvements in function in a reasonable and predictable amount of time.     Precautions / Restrictions  Precautions Precautions: Fall Precaution Comments: watch O2 Restrictions Weight Bearing Restrictions: No      Mobility  Bed Mobility Overal bed mobility: Needs Assistance Bed Mobility: Supine to Sit     Supine to sit: Min assist, HOB elevated     General bed mobility comments: pt needing encouragement to self assist, light min assist for LEs over EOB and to raise trunk with use of rail    Transfers Overall transfer level: Needs assistance Equipment used: Rolling walker (2 wheels) Transfers: Sit to/from Stand, Bed to chair/wheelchair/BSC Sit to Stand: +2 physical assistance, Min assist   Step pivot transfers: +2 physical assistance, Min assist       General transfer comment: assist to rise and steady, cues for hand placement with stand to sit    Ambulation/Gait               General Gait Details: Deferred  Stairs            Wheelchair Mobility    Modified Rankin (Stroke Patients Only)       Balance Overall balance assessment: Needs assistance   Sitting balance-Leahy Scale: Fair     Standing balance support: Bilateral upper extremity supported Standing balance-Leahy Scale: Poor                               Pertinent Vitals/Pain Pain Assessment Pain Assessment: Faces Faces Pain Scale: Hurts little more Pain Location: chest Pain Descriptors / Indicators: Tightness Pain Intervention(s): Premedicated before session, Monitored during session, Limited activity within patient's tolerance    Home Living Family/patient expects to be discharged to:: Private residence  Living Arrangements: Alone Available Help at Discharge: Family;Available PRN/intermittently Type of Home: House Home Access: Level entry       Home Layout: One level Home Equipment: Rollator (4 wheels);Shower seat - built in;Grab bars - toilet;Grab bars - tub/shower;Cane - single point;Wheelchair - Forensic psychologist (2 wheels);Hand held shower head Additional  Comments: Son called and reports that he lives 20 miles away however pt reports that son is disabled and lives next door. Pt was recently DC from SNF 3 days ago, pt receives frozen meals.    Prior Function Prior Level of Function : Needs assist;History of Falls (last six months)             Mobility Comments: Pt reports using RW at home since DC from SNF ADLs Comments: Pt receives meals on wheels and son helps with groceries and appointments. Pt reports having assistance with ADLs since DC from SNF.     Hand Dominance   Dominant Hand: Right    Extremity/Trunk Assessment   Upper Extremity Assessment Upper Extremity Assessment: Generalized weakness    Lower Extremity Assessment Lower Extremity Assessment: Generalized weakness    Cervical / Trunk Assessment Cervical / Trunk Assessment: Kyphotic  Communication   Communication: HOH  Cognition Arousal/Alertness: Awake/alert Behavior During Therapy: Anxious Overall Cognitive Status: Within Functional Limits for tasks assessed                                 General Comments: difficult to accurately assess due to [facility] and anxiety        General Comments General comments (skin integrity, edema, etc.): VSS on 3L    Exercises     Assessment/Plan    PT Assessment Patient needs continued PT services  PT Problem List Decreased strength;Decreased range of motion;Decreased activity tolerance;Decreased balance;Decreased mobility;Decreased safety awareness;Decreased knowledge of use of DME;Pain       PT Treatment Interventions DME instruction;Gait training;Functional mobility training;Therapeutic activities;Therapeutic exercise;Balance training;Patient/family education    PT Goals (Current goals can be found in the Care Plan section)  Acute Rehab PT Goals Patient Stated Goal: To go home PT Goal Formulation: With patient Time For Goal Achievement: 02/23/23 Potential to Achieve Goals: Fair    Frequency Min  1X/week     Co-evaluation PT/OT/SLP Co-Evaluation/Treatment: Yes Reason for Co-Treatment: For patient/therapist safety PT goals addressed during session: Mobility/safety with mobility;Proper use of DME OT goals addressed during session: ADL's and self-care       AM-PAC PT "6 Clicks" Mobility  Outcome Measure Help needed turning from your back to your side while in a flat bed without using bedrails?: A Little Help needed moving from lying on your back to sitting on the side of a flat bed without using bedrails?: A Little Help needed moving to and from a bed to a chair (including a wheelchair)?: A Lot Help needed standing up from a chair using your arms (e.g., wheelchair or bedside chair)?: A Lot Help needed to walk in hospital room?: Total Help needed climbing 3-5 steps with a railing? : Total 6 Click Score: 12    End of Session Equipment Utilized During Treatment: Gait belt;Oxygen Activity Tolerance: Patient tolerated treatment well Patient left: in chair;with call bell/phone within reach;with chair alarm set Nurse Communication: Mobility status PT Visit Diagnosis: Unsteadiness on feet (R26.81);Muscle weakness (generalized) (M62.81);Difficulty in walking, not elsewhere classified (R26.2);Pain    Time: 1007-1036 PT Time Calculation (min) (ACUTE ONLY): 29 min  Charges:   PT Evaluation $PT Eval Moderate Complexity: 1 Mod PT Treatments $Therapeutic Activity: 8-22 mins        [name], PT, DPT Acute Rehab Services [phone]   [name] 02/09/2023, 3:15 PM

## 2023-02-09 NOTE — Evaluation (Signed)
Occupational Therapy Evaluation Patient Details Name: [name] MRN: [id number] DOB: 22-Feb-1935 Today's Date: 02/09/2023   History of Present Illness Pt admitted 6/4 from home, after being discharged from SNF 3 days prior, with hypoxia and edema in abdomen and LEs secondary to CHF exacerbation.  Pt with hx of: lumbar compression fractures, R fib head fx treated conservatively, HOH, CHF, colon CA, breast CA, CAD, COPD, CVA, afib.   Clinical Impression   Pt has been walking with a rollator and assisted for some aspects of ADLs (LB) since returning home from SNF. Pt presents with dyspnea with SpO2 dropping to 87% on 3L O2 with transfer to chair. Rebounded to 92% with pursed lip breathing. Pt requires +2 min assist for all mobility and set up to total assist for ADLs. Recommending HHOT if pt can return to an intermittent supervision level as her family cannot provide 24 hour care per telephone conversation with son.      Recommendations for follow up therapy are one component of a multi-disciplinary discharge planning process, led by the attending physician.  Recommendations [place] be updated based on patient status, additional functional criteria and insurance authorization.   Assistance Recommended at Discharge Frequent or constant Supervision/Assistance  Patient can return home with the following A little help with walking and/or transfers;A lot of help with bathing/dressing/bathroom;Assistance with cooking/housework;Direct supervision/assist for medications management;Assist for transportation;Direct supervision/assist for financial management;Help with stairs or ramp for entrance    Functional Status Assessment  Patient has had a recent decline in their functional status and demonstrates the ability to make significant improvements in function in a reasonable and predictable amount of time.  Equipment Recommendations  None recommended by OT    Recommendations for Other Services        Precautions / Restrictions Precautions Precautions: Fall Precaution Comments: watch O2 Restrictions Weight Bearing Restrictions: No      Mobility Bed Mobility Overal bed mobility: Needs Assistance Bed Mobility: Supine to Sit     Supine to sit: Min assist, HOB elevated     General bed mobility comments: pt needing encouragement to self assist, light min assist for LEs over EOB and to raise trunk with use of rail    Transfers Overall transfer level: Needs assistance Equipment used: Rolling walker (2 wheels) Transfers: Sit to/from Stand, Bed to chair/wheelchair/BSC Sit to Stand: +2 physical assistance, Min assist     Step pivot transfers: +2 physical assistance, Min assist     General transfer comment: assist to rise and steady, cues for hand placement with stand to sit      Balance Overall balance assessment: Needs assistance   Sitting balance-Leahy Scale: Fair     Standing balance support: Bilateral upper extremity supported Standing balance-Leahy Scale: Poor                             ADL either performed or assessed with clinical judgement   ADL Overall ADL's : Needs assistance/impaired Eating/Feeding: Set up;Sitting   Grooming: Wash/dry face;Sitting;Set up Grooming Details (indicate cue type and reason): cues for thoroughness Upper Body Bathing: Minimal assistance;Sitting   Lower Body Bathing: Maximal assistance;Sit to/from stand   Upper Body Dressing : Minimal assistance;Sitting   Lower Body Dressing: Total assistance;Bed level Lower Body Dressing Details (indicate cue type and reason): socks Toilet Transfer: +2 for physical assistance;Minimal assistance;Stand-pivot;Rolling walker (2 wheels)  Vision Baseline Vision/History: 1 Wears glasses Ability to See in Adequate Light: 0 Adequate Patient Visual Report: No change from baseline       Perception     Praxis      Pertinent Vitals/Pain Pain Assessment Pain  Assessment: Faces Faces Pain Scale: Hurts little more Pain Location: chest Pain Descriptors / Indicators: Tightness Pain Intervention(s): Monitored during session, Repositioned     Hand Dominance Right   Extremity/Trunk Assessment Upper Extremity Assessment Upper Extremity Assessment: Generalized weakness   Lower Extremity Assessment Lower Extremity Assessment: Defer to PT evaluation   Cervical / Trunk Assessment Cervical / Trunk Assessment: Kyphotic   Communication Communication Communication: HOH   Cognition Arousal/Alertness: Awake/alert Behavior During Therapy: Anxious Overall Cognitive Status: Within Functional Limits for tasks assessed                                 General Comments: difficult to accurately assess due to [facility] and anxiety     General Comments       Exercises     Shoulder Instructions      Home Living Family/patient expects to be discharged to:: Private residence Living Arrangements: Alone Available Help at Discharge: Family;Available PRN/intermittently Type of Home: House Home Access: Level entry     Home Layout: One level     Bathroom Shower/Tub: Walk-in shower;Sponge bathes at baseline   Bathroom Toilet: Handicapped height Bathroom Accessibility: No   Home Equipment: Rollator (4 wheels);Shower seat - built in;Grab bars - toilet;Grab bars - tub/shower;Cane - single point;Wheelchair - Forensic psychologist (2 wheels);Hand held shower head   Additional Comments: Son called and reports that he lives 20 miles away however pt reports that son is disabled and lives next door. Pt was recently DC from SNF 3 days ago, pt receives frozen meals.      Prior Functioning/Environment Prior Level of Function : Needs assist;History of Falls (last six months)             Mobility Comments: Pt reports using RW at home since DC from SNF ADLs Comments: Pt receives meals on wheels and son helps with groceries and appointments. Pt  reports having assistance with ADLs since DC from SNF.        OT Problem List: Decreased activity tolerance;Impaired balance (sitting and/or standing);Decreased safety awareness;Decreased knowledge of precautions;Decreased knowledge of use of DME or AE      OT Treatment/Interventions: Self-care/ADL training;DME and/or AE instruction;Therapeutic activities;Energy conservation;Balance training;Patient/family education    OT Goals(Current goals can be found in the care plan section) Acute Rehab OT Goals OT Goal Formulation: With patient Time For Goal Achievement: 02/23/23 Potential to Achieve Goals: Good ADL Goals Pt Will Perform Grooming: with supervision;standing Pt Will Perform Lower Body Bathing: with supervision;with adaptive equipment;sit to/from stand Pt Will Perform Lower Body Dressing: with supervision;with adaptive equipment;sit to/from stand Pt Will Transfer to Toilet: with supervision;ambulating;bedside commode Pt Will Perform Toileting - Clothing Manipulation and hygiene: with supervision;sit to/from stand Additional ADL Goal #1: Pt will employ energy conservation strategies in ADLs. Additional ADL Goal #2: Pt will perform bed mobility modified independently.  OT Frequency: Min 2X/week    Co-evaluation PT/OT/SLP Co-Evaluation/Treatment: Yes Reason for Co-Treatment: For patient/therapist safety   OT goals addressed during session: ADL's and self-care      AM-PAC OT "6 Clicks" Daily Activity     Outcome Measure Help from another person eating meals?: None Help from another person taking care  of personal grooming?: A Little Help from another person toileting, which includes using toliet, bedpan, or urinal?: A Lot Help from another person bathing (including washing, rinsing, drying)?: A Lot Help from another person to put on and taking off regular upper body clothing?: A Little Help from another person to put on and taking off regular lower body clothing?: Total 6 Click  Score: 15   End of Session Equipment Utilized During Treatment: Gait belt;Rolling walker (2 wheels);Oxygen  Activity Tolerance: Patient tolerated treatment well Patient left: in chair;with call bell/phone within reach;with chair alarm set  OT Visit Diagnosis: Unsteadiness on feet (R26.81);Other abnormalities of gait and mobility (R26.89);Muscle weakness (generalized) (M62.81);Other (comment) (decreased activity tolerance)                Time: 9604-5409 OT Time Calculation (min): 29 min Charges:  OT General Charges $OT Visit: 1 Visit OT Evaluation $OT Eval Moderate Complexity: 1 Mod  Berna Spare, OTR/L Acute Rehabilitation Services Office: [phone]   Brendolyn, Birts 02/09/2023, 11:33 AM

## 2023-02-09 NOTE — Progress Notes (Addendum)
REDS Clip  READING= 27% > 29% on repeat  Thank you for allowing pharmacy to participate in this patient's care,  Sherron Monday, PharmD, BCCCP Clinical Pharmacist  Phone: [phone] 02/09/2023 12:33 PM  Please check AMION for all [facility] phone numbers After 10:00 PM, call Main Pharmacy [phone]

## 2023-02-10 ENCOUNTER — Inpatient Hospital Stay (HOSPITAL_COMMUNITY): Payer: Medicare Other

## 2023-02-10 DIAGNOSIS — I5033 Acute on chronic diastolic (congestive) heart failure: Secondary | ICD-10-CM | POA: Diagnosis not present

## 2023-02-10 HISTORY — PX: IR THORACENTESIS RIGHT ASP PLEURAL SPACE W/IMG GUIDE: IMG5380

## 2023-02-10 LAB — HEPATIC FUNCTION PANEL
ALT: 17 U/L (ref 0–44)
AST: 19 U/L (ref 15–41)
Albumin: 2.8 g/dL — ABNORMAL LOW (ref 3.5–5.0)
Alkaline Phosphatase: 74 U/L (ref 38–126)
Bilirubin, Direct: 0.2 mg/dL (ref 0.0–0.2)
Indirect Bilirubin: 0.4 mg/dL (ref 0.3–0.9)
Total Bilirubin: 0.6 mg/dL (ref 0.3–1.2)
Total Protein: 5.7 g/dL — ABNORMAL LOW (ref 6.5–8.1)

## 2023-02-10 LAB — BASIC METABOLIC PANEL
Anion gap: 9 (ref 5–15)
BUN: 39 mg/dL — ABNORMAL HIGH (ref 8–23)
CO2: 31 mmol/L (ref 22–32)
Calcium: 9 mg/dL (ref 8.9–10.3)
Chloride: 94 mmol/L — ABNORMAL LOW (ref 98–111)
Creatinine, Ser: 1.34 mg/dL — ABNORMAL HIGH (ref 0.44–1.00)
GFR, Estimated: 39 mL/min — ABNORMAL LOW (ref >60)
Glucose, Bld: 122 mg/dL — ABNORMAL HIGH (ref 70–99)
Potassium: 4.5 mmol/L (ref 3.5–5.1)
Sodium: 134 mmol/L — ABNORMAL LOW (ref 135–145)

## 2023-02-10 LAB — PROTEIN, PLEURAL OR PERITONEAL FLUID: Total protein, fluid: <3 g/dL

## 2023-02-10 LAB — CBC
HCT: 31.7 % — ABNORMAL LOW (ref 36.0–46.0)
Hemoglobin: 9.7 g/dL — ABNORMAL LOW (ref 12.0–15.0)
MCH: 29.8 pg (ref 26.0–34.0)
MCHC: 30.6 g/dL (ref 30.0–36.0)
MCV: 97.5 fL (ref 80.0–100.0)
Platelets: 191 10*3/uL (ref 150–400)
RBC: 3.25 MIL/uL — ABNORMAL LOW (ref 3.87–5.11)
RDW: 14.8 % (ref 11.5–15.5)
WBC: 7.2 10*3/uL (ref 4.0–10.5)
nRBC: 0 % (ref 0.0–0.2)

## 2023-02-10 LAB — LACTATE DEHYDROGENASE, PLEURAL OR PERITONEAL FLUID: LD, Fluid: 36 U/L — ABNORMAL HIGH (ref 3–23)

## 2023-02-10 LAB — GRAM STAIN

## 2023-02-10 LAB — LACTATE DEHYDROGENASE: LDH: 128 U/L (ref 98–192)

## 2023-02-10 MED ORDER — SALINE SPRAY 0.65 % NA SOLN
1.0000 | NASAL | Status: DC | PRN
  Filled 2023-02-10: qty 44

## 2023-02-10 MED ORDER — APIXABAN 5 MG PO TABS
5.0000 mg | ORAL_TABLET | Freq: Two times a day (BID) | ORAL | Status: DC
  Administered 2023-02-10 – 2023-02-16 (×11): 5 mg via ORAL
  Filled 2023-02-10 (×11): qty 1

## 2023-02-10 MED ORDER — LIDOCAINE HCL 1 % IJ SOLN
10.0000 mL | Freq: Once | INTRAMUSCULAR | Status: AC
  Administered 2023-02-10: 10 mL

## 2023-02-10 MED ORDER — METOLAZONE 2.5 MG PO TABS
2.5000 mg | ORAL_TABLET | Freq: Once | ORAL | Status: AC
  Administered 2023-02-10: 2.5 mg via ORAL
  Filled 2023-02-10: qty 1

## 2023-02-10 MED ORDER — LIDOCAINE HCL 1 % IJ SOLN
INTRAMUSCULAR | Status: AC
  Filled 2023-02-10: qty 20

## 2023-02-10 MED ORDER — TRAZODONE HCL 50 MG PO TABS
50.0000 mg | ORAL_TABLET | Freq: Once | ORAL | Status: AC
  Administered 2023-02-10: 50 mg via ORAL
  Filled 2023-02-10: qty 1

## 2023-02-10 NOTE — Progress Notes (Addendum)
Advanced Heart Failure Rounding Note  PCP-Cardiologist: Armanda Magic, MD   Subjective:    Diuresing with IV lasix. Weight down 2lbs. -1.8L UOP.   Very tachypneic on exam this morning. Also asking to use BSC. Discussed with RN, plan to call IR to expedite thoracentesis and with assistance to [facility]  Objective:   Weight Range: 82.3 kg Body mass index is 30.2 kg/m.   Vital Signs:   Temp:  [98.1 F (36.7 C)-98.6 F (37 C)] 98.5 F (36.9 C) (06/06 0349) Pulse Rate:  [62-78] 69 (06/06 0349) Resp:  [17-20] 19 (06/06 0349) BP: (144-182)/(44-54) 155/49 (06/06 0349) SpO2:  [93 %-100 %] 95 % (06/06 0349) Weight:  [82.3 kg] 82.3 kg (06/06 0349) Last BM Date : 02/06/23  Weight change: Filed Weights   02/08/23 1353 02/09/23 0457 02/10/23 0349  Weight: 78.9 kg 83.1 kg 82.3 kg    Intake/Output:   Intake/Output Summary (Last 24 hours) at 02/10/2023 0855 Last data filed at 02/10/2023 0351 Gross per 24 hour  Intake 477 ml  Output 1800 ml  Net -1323 ml   Physical Exam    General:  ill appearing.   HEENT: 2L [place] Neck: supple. JVD ~12cm. Carotids 2+ bilat; no bruits. No lymphadenopathy or thyromegaly appreciated. Cor: PMI nondisplaced. Regular rate & rhythm. No rubs, gallops or murmurs. Lungs: shallow breaths, diminished bases. Tachypneic Abdomen: soft, nontender, nondistended. No hepatosplenomegaly. No bruits or masses. Good bowel sounds. Extremities: no cyanosis, clubbing, rash, +1 BLE edema to thighs Neuro: alert & oriented x 3, cranial nerves grossly intact. moves all 4 extremities w/o difficulty. Affect pleasant.   Telemetry   A fib 80s (Personally reviewed)    EKG    No new EKG today  Labs    CBC Recent Labs    02/07/23 1641 02/07/23 1726 02/10/23 0127  WBC 6.8  --  7.2  NEUTROABS 3.8  --   --   HGB 10.6* 11.6* 9.7*  HCT 35.0* 34.0* 31.7*  MCV 99.2  --  97.5  PLT 223  --  191   Basic Metabolic Panel Recent Labs    16/10/96 1641 02/07/23 1726  02/09/23 0106 02/10/23 0127  NA 131*   < > 135 134*  K 4.1   < > 4.6 4.5  CL 94*   < > 96* 94*  CO2 31   < > 33* 31  GLUCOSE 134*   < > 112* 122*  BUN 22   < > 32* 39*  CREATININE 1.17*   < > 1.38* 1.34*  CALCIUM 8.8*   < > 8.9 9.0  MG 1.9  --  2.1  --    < > = values in this interval not displayed.   Liver Function Tests Recent Labs    02/07/23 1641  AST 35  ALT 23  ALKPHOS 84  BILITOT 0.5  PROT 6.1*  ALBUMIN 3.1*   No results for input(s): "LIPASE", "AMYLASE" in the last 72 hours. Cardiac Enzymes No results for input(s): "CKTOTAL", "CKMB", "CKMBINDEX", "TROPONINI" in the last 72 hours.  BNP: BNP (last 3 results) Recent Labs    11/18/22 0635 12/14/22 1246 02/07/23 1641  BNP 136.1* 117.9* 473.9*    ProBNP (last 3 results) No results for input(s): "PROBNP" in the last 8760 hours.   D-Dimer No results for input(s): "DDIMER" in the last 72 hours. Hemoglobin A1C No results for input(s): "HGBA1C" in the last 72 hours. Fasting Lipid Panel No results for input(s): "CHOL", "HDL", "LDLCALC", "TRIG", "CHOLHDL", "  LDLDIRECT" in the last 72 hours. Thyroid Function Tests No results for input(s): "TSH", "T4TOTAL", "T3FREE", "THYROIDAB" in the last 72 hours.  Invalid input(s): "FREET3"  Other results:   Imaging    No results found.   Medications:     Scheduled Medications:  amLODipine  10 mg Oral Daily   apixaban  5 mg Oral BID   Chlorhexidine Gluconate Cloth  6 each Topical Daily   cloNIDine  0.2 mg Oral BID   furosemide  80 mg Intravenous BID   ipratropium-albuterol  3 mL Nebulization Q6H   pantoprazole  40 mg Oral Daily   sodium chloride  2 g Oral BID WC   spironolactone  12.5 mg Oral Daily    Infusions:   PRN Medications: acetaminophen **OR** acetaminophen, famotidine, hydrALAZINE, ipratropium-albuterol, ondansetron **OR** ondansetron (ZOFRAN) IV, ondansetron, mouth rinse, oxyCODONE, ranolazine    Patient Profile   88 y.o. with history of  poorly controlled HTN, chronic atrial fibrillation, chronic diastolic CHF, and prior episode of Takotsubo cardiomyopathy. AHF team asked to see for acute on chronic systolic heart failure.   Assessment/Plan  1. Chronic diastolic CHF: NYHA class III symptoms, slowly progressive over time.  Mild volume overload on exam.  RHC/LHC in 4/22 showed nonobstructive CAD; mean RA 5, PA 44/10, mean PCWP 20, CI 4.89, PVR < 1 WU (pulmonary venous hypertension).  Echo in 8/22 showed EF up to 65-70%, mild LVH, normal RV, PASP 67, moderate RAE, trivial MR. PYP scan in 9/22 was probably negative. Echo in 7/23 showed EF 60-65%, mild LVH, moderate RV enlargement, normal RV systolic function, PASP 50 mmHg, mild mitral stenosis with mean gradient 4 mmHg, mild-moderate AS, IVC normal. NYHA class III-IV symptoms, weight up 21 lbs since 01/04/23.  - echo 02/08/23: EF 65/70%, mild concentric LVH, normal RV, severely elevated PASP, LA/RA severely dilated, mos aortic stenosis - Continue diuresis with 80 IV lasix BID.  - Off spiro and losartan with hyperkalemia hx, spiro started this admission by primary, will continue for now. Would not increase, low threshold to stop.  - off BB with bradycardia - Unable to tolerate Jardiance due to frequent UTIs.  - strict I&O, daily weights  - she has declined Cardiomems 2.  CAD: Nonobstructive on 4/22 cath. She has been intolerant of statins and Zetia.   She has had atypical chest pain in the past. Admission 7/23 with CP, elevated HsT, CT ruled out acute aortic syndrome, echo ok. Pain felt to be related to coronary microvascular dysfunction.   - No ASA given apixaban use.  - Intolerant of statins and Zetia 3. Chronic atrial fibrillation/bradycardia: She is off nodal blockade. - Continue Eliquis 5 mg bid 4. HTN: BP elevated - Off spironolactone and losartan with hyperkalemia - Continue amlodipine 10 mg daily - Continue Clonidine 0.2 mg BID - consider adding hydral.  5. Aortic stenosis:  Mild-moderate on last echo.  - mod on echo 6/4  6. Bilateral pleural effusions -  CT chest 02/08/23: Bilateral moderate to large pleural effusions with moderate to marked severity bilateral lower lobe compressive atelectasis. - contributing to SOB, very tachypneic this morning - IR consulted yesterday for thoracentesis. Discussed with RN, plan to expedite and reach out to IR asap 7. Physical Deconditioning - recent fall with closed compression fracture of L5 vertebra. - Continue working with PT/OT 8. Hx of Hyperkalemia - stable 4.5 today - spiro started this admission, would be very careful with history of hyperkalemia. Would continue current dose of 12.5 and not increase.  Low threshold to stop.  - follow daily BMET  Length of Stay: 2  Alen Bleacher, NP  02/10/2023, 8:55 AM  Advanced Heart Failure Team Pager [phone] (M-F; 7a - 5p)  Please contact [facility] for night-coverage after hours (5p -7a ) and weekends on [email or web address]  Patient seen with NP, agree with the above note.    She had left thoracentesis today, 600 cc off.  Good UOP so far with Lasix.  Creatinine stable 1.34.  She remains short of breath and mildly tachypneic.   General: NAD Neck: JVP 14-16 cm, no thyromegaly or thyroid nodule.  Lungs: Decreased BS at bases.  CV: Nondisplaced PMI.  Heart irregular S1/S2, no S3/S4, no murmur.  2+ edema to knees.  Abdomen: Soft, nontender, no hepatosplenomegaly, no distention.  Skin: Intact without lesions or rashes.  Neurologic: Alert and oriented x 3.  Psych: Normal affect. Extremities: No clubbing or cyanosis.  HEENT: Normal.   Acute on chronic diastolic CHF in setting of cutting back diuretic after recent admission. Echo this admission with EF 65-70%, mild LVH, normal RV, mild MR, moderate AS with mean gradient 16 mmHg.  CTA chest negative for PE, showed mod-large bilateral effusions. Creatinine stable 1.34.  Still with volume overload.  - Lasix 80 mg IV bid today, will give a  dose of metolazone with pm torsemide.   - Continue spironolactone 12.5 daily, would not increase with prior history of hyperkalemia on spironolactone.   - Had workup for cardiac amyloidosis in the past with equivocal PYP scan, would repeat PYP scan in the future.   Left thoracentesis today, will send serum LDH and protein to be able to assess for transudate vs exudate.  She should get right thoracentesis tomorrow.   BP still elevated, will add hydralazine 25 tid.   Marca Ancona 02/10/2023 3:21 PM

## 2023-02-10 NOTE — Procedures (Signed)
PROCEDURE SUMMARY:  Successful US guided left thoracentesis. Yielded 600 mL of clear yellow fluid. Patient tolerated procedure well. No immediate complications. EBL = trace  Specimen was sent for labs.  Post procedure chest X-ray reveals no pneumothorax  Govind Furey S Tadashi Burkel PA-C 02/10/2023 11:41 AM

## 2023-02-10 NOTE — TOC Initial Note (Addendum)
Transition of Care [facility]) - Initial/Assessment Note    Patient Details  Name: [name] MRN: [id number] Date of Birth: 1935-07-11  Transition of Care [facility]) CM/SW Contact:    Leone Haven, RN Phone Number: 02/10/2023, 2:26 PM  Clinical Narrative:                 From home alone, she has PCP, Dr. Tanya Nones, she has insurance on file, she has rollator, walker, cane and hospital bed at home.  Her son, [name] and nephew Gerlene Burdock and his wife are her support system.  [name] will transport her home at dc.  Prior level of function, she was walking with a walker indep, she is active with [facility] for [facility] services and she would like to continue with them.  This NCM is confirming with Haywood Lasso with [facility] regarding services she had with them.  Awaiting call back.  She gets medications from [facility], on [address] in [place].    Expected Discharge Plan: Home w Home Health Services Barriers to Discharge: Continued Medical Work up   Patient Goals and CMS Choice Patient states their goals for this hospitalization and ongoing recovery are:: return home CMS Medicare.gov Compare Post Acute Care list provided to:: Patient Choice offered to / list presented to : Patient      Expected Discharge Plan and Services In-house Referral: NA Discharge Planning Services: CM Consult Post Acute Care Choice: Home Health Living arrangements for the past 2 months: Single Family Home                 DME Arranged: N/A DME Agency: NA       HH Arranged: RN, PT, Nurse's Aide HH Agency: [facility] Date HH Agency Contacted: 02/10/23 Time HH Agency Contacted: 1425 Representative spoke with at [facility] Agency: Haywood Lasso  Prior Living Arrangements/Services Living arrangements for the past 2 months: Single Family Home Lives with:: Self Patient language and need for interpreter reviewed:: Yes Do you feel safe going back to the place where you live?: Yes      Need for Family Participation in Patient Care: Yes  (Comment) Care giver support system in place?: Yes (comment) Current home services: DME (rollator, walkder, cane, hospi bed) Criminal Activity/Legal Involvement Pertinent to Current Situation/Hospitalization: No - Comment as needed  Activities of Daily Living Home Assistive Devices/Equipment: Environmental consultant (specify type) ADL Screening (condition at time of admission) Patient's cognitive ability adequate to safely complete daily activities?: Yes Is the patient deaf or have difficulty hearing?: No Does the patient have difficulty seeing, even when wearing glasses/contacts?: No Does the patient have difficulty concentrating, remembering, or making decisions?: No Patient able to express need for assistance with ADLs?: Yes Does the patient have difficulty dressing or bathing?: Yes Independently performs ADLs?: No Communication: Independent Dressing (OT): Needs assistance Is this a change from baseline?: Pre-admission baseline Grooming: Needs assistance Is this a change from baseline?: Pre-admission baseline Feeding: Needs assistance Is this a change from baseline?: Change from baseline, expected to last >3 days Bathing: Needs assistance Is this a change from baseline?: Change from baseline, expected to last >3 days Toileting: Needs assistance Is this a change from baseline?: Change from baseline, expected to last >3days In/Out Bed: Needs assistance Is this a change from baseline?: Change from baseline, expected to last >3 days Walks in Home: Needs assistance, Independent with device (comment) Is this a change from baseline?: Change from baseline, expected to last >3 days Does the patient have difficulty walking or climbing stairs?:  Yes Weakness of Legs: Both Weakness of Arms/Hands: Both  Permission Sought/Granted                  Emotional Assessment Appearance:: Appears stated age Attitude/Demeanor/Rapport: Gracious Affect (typically observed): Accepting Orientation: : Oriented to  Self, Oriented to Place, Oriented to  Time, Oriented to Situation Alcohol / Substance Use: Not Applicable Psych Involvement: No (comment)  Admission diagnosis:  Acute exacerbation of CHF (congestive heart failure) (HCC) [I50.9] Acute congestive heart failure, unspecified heart failure type (HCC) [I50.9] Acute hypoxic respiratory failure (HCC) [J96.01] Heart failure (HCC) [I50.9] Patient Active Problem List   Diagnosis Date Noted   Pressure injury of skin 02/08/2023   Heart failure (HCC) 02/08/2023   Acute on chronic diastolic heart failure (HCC) 02/07/2023   Lumbar compression fracture, closed, initial encounter (HCC) 01/13/2023   Hyperkalemia 01/11/2023   Acute blood loss anemia 01/11/2023   Left ankle pain 01/10/2023   Closed disp comminuted fracture of shaft of right fibula with malunion 01/09/2023   Fall 01/09/2023   GAD (generalized anxiety disorder) 01/09/2023   Anemia of chronic disease 01/09/2023   Closed fracture of head of right fibula 01/09/2023   Closed right fibular fracture 01/09/2023   Vitamin D deficiency 01/09/2023   Closed compression fracture of L5 lumbar vertebra, initial encounter (HCC) 01/08/2023   Hyponatremia 11/18/2022   Lower back pain 11/18/2022   Normocytic anemia 11/18/2022   Closed compression fracture of L3 lumbar vertebra, initial encounter (HCC) 11/17/2022   Acute bacterial sinusitis 10/27/2022   Abnormal results on imaging study of genitourinary system 03/25/2022   NSTEMI (non-ST elevated myocardial infarction) (HCC)    Persistent atrial fibrillation (HCC) 03/15/2022   Chronic kidney disease, stage 3b (HCC) 03/15/2022   Elevated troponin 03/15/2022   Postmenopausal bleeding 06/05/2020   Chest pain 07/23/2019   Diverticulitis large intestine w/o perforation or abscess w/o bleeding 09/04/2018   Colovesical fistula    Preoperative clearance 08/07/2018   TIA (transient ischemic attack) 01/06/2018   Breast cancer (HCC) 09/13/2017   Obesity,  unspecified 09/13/2017   Warfarin anticoagulation 09/13/2017   Uterine mass 09/07/2017   Adnexal mass 08/03/2017   Pelvic abscess in female 07/07/2017   Aortic stenosis    Dyslipidemia, goal LDL below 70 11/28/2016   Monitoring for long-term anticoagulant use 12/31/2015   Chronic diastolic CHF (congestive heart failure) (HCC) 10/28/2015   Insomnia 10/23/2015   Leg swelling 08/11/2015   Takotsubo syndrome 07/29/2015   DOE (dyspnea on exertion) 08/15/2014   Dilated aortic root (HCC)    Pulmonary HTN (HCC)    Permanent atrial fibrillation (HCC) 07/04/2013   Chronic anticoagulation 07/04/2013   Coronary artery disease    Lesion of nipple 05/15/2013   GERD (gastroesophageal reflux disease)    Essential hypertension    COPD (chronic obstructive pulmonary disease) (HCC)    Osteopenia    Diverticulosis    Hiatal hernia    Abdominal pain 01/11/2012   Breast cancer of upper-outer quadrant of left female breast (HCC) 01/06/2012   Colon cancer (HCC) 01/06/2012   PCP:  Donita Brooks, MD Pharmacy:   [facility] - [place], [place] - [address] [place] [address] Phone: [phone] Fax: [phone]  [facility] [address], [place] - [address] [place] [address] Phone: [phone] Fax: [phone]     Social Determinants of Health (SDOH) Social History: SDOH Screenings   Food Insecurity: No Food Insecurity (02/08/2023)  Housing: Low  Risk  (02/08/2023)  Transportation Needs: No Transportation Needs (02/08/2023)  Utilities: Not At Risk (02/08/2023)  Alcohol Screen: Low Risk  (11/08/2022)  Depression (PHQ2-9): Low Risk  (12/16/2022)  Financial Resource Strain: Low Risk  (11/08/2022)  Recent Concern: Financial Resource Strain - Medium Risk (11/04/2022)  Physical Activity: Sufficiently Active (11/08/2022)  Recent Concern: Physical Activity - Insufficiently Active (08/11/2022)  Social Connections: Moderately Integrated (11/08/2022)   Recent Concern: Social Connections - Moderately Isolated (08/11/2022)  Stress: No Stress Concern Present (11/08/2022)  Tobacco Use: Low Risk  (02/10/2023)   SDOH Interventions:     Readmission Risk Interventions    02/10/2023    2:19 PM  Readmission Risk Prevention Plan  Transportation Screening Complete  Medication Review (RN Care Manager) Complete  PCP or Specialist appointment within 3-5 days of discharge Complete  HRI or Home Care Consult Complete  Palliative Care Screening Not Applicable  Skilled Nursing Facility Not Applicable

## 2023-02-10 NOTE — Progress Notes (Signed)
PROGRESS NOTE    [name]  GNF:[id number] DOB: 1934-12-01 DOA: 02/07/2023 PCP: Donita Brooks, MD   88/F w/aortic stenosis, hypertension, CAD, atrial fibrillation, pulmonary hypertension who presented with dyspnea. Reported four weeks of worsening dyspnea, orthopnea and 40 lbs weight gain.  -Recent hospitalization 5/4-5/13 for closed compression fracture L5 and closed right fibular fracture.  -admitted 6/3 w/ respiratory distress and hypoxia. -CXR w/cardiomegaly with bilateral hilar vascular congestion, bilateral increased lung markings and bilateral pleural effusions.     Subjective: Feels short of breath  Assessment and Plan:  Acute on chronic diastolic heart failure (HCC) Severe pulmonary hypertension Echo with EF 65 to 70%, mild LVH, RV preserved, severely elevated PA systolic pressures, severe TR, moderate aortic stenosis.  -CTA chest with moderate to large bilateral pleural effusions, albumin is 3.1 -Remains on IV Lasix, poor response to diuretics thus far, IR consulted for thoracentesis, will hold apixaban -Continue Aldactone, amlodipine -Appreciate CHF team input   Persistent atrial fibrillation (HCC) Rate controlled.  In A-fib at this time -Hold apixaban for thoracentesis   Coronary artery disease -stable   Essential hypertension Continue amlodipine and clonidine. -Anticipate improvement with diuresis/thoracentesis,, add as needed hydralazine   Anemia of chronic disease -stable with Hgb at 11.6   COPD (chronic obstructive pulmonary disease) (HCC) -continue bronchodilator therapy.    GERD (gastroesophageal reflux disease) Continue antiacid therapy.   Closed compression fracture of L5 lumbar vertebra, initial encounter (HCC) -pain control, PT and Ot/.   Lung nodules -needs FU  Thyroid nodules -needs FU   Pressure injury of skin Continue local care  DVT prophylaxis: Apixaban Code Status: DNR Family Communication: No family at bedside Disposition  Plan: Przybylski need SNF  Consultants:    Procedures:   Antimicrobials:    Objective: Vitals:   02/10/23 0019 02/10/23 0056 02/10/23 0349 02/10/23 0909  BP: (!) 144/49  (!) 155/49   Pulse: 62  69   Resp: 17  19   Temp: 98.2 F (36.8 C)  98.5 F (36.9 C)   TempSrc: Oral  Oral   SpO2: 99% 100% 95% 96%  Weight:   82.3 kg   Height:        Intake/Output Summary (Last 24 hours) at 02/10/2023 1127 Last data filed at 02/10/2023 0934 Gross per 24 hour  Intake 597 ml  Output 2125 ml  Net -1528 ml   Filed Weights   02/08/23 1353 02/09/23 0457 02/10/23 0349  Weight: 78.9 kg 83.1 kg 82.3 kg    Examination:  General exam: Frail elderly female sitting up in bed, tachypneic, AAO x 2 HEENT: Positive JVD CVS: S1-S2, regular rhythm, systolic murmur Lungs: Decreased breath sounds at both bases Abdomen: Soft, nontender, bowel sounds present Extremities: no edema Skin: No rashes Psychiatry:  Mood & affect appropriate.     Data Reviewed:   CBC: Recent Labs  Lab 02/07/23 1641 02/07/23 1726 02/10/23 0127  WBC 6.8  --  7.2  NEUTROABS 3.8  --   --   HGB 10.6* 11.6* 9.7*  HCT 35.0* 34.0* 31.7*  MCV 99.2  --  97.5  PLT 223  --  191   Basic Metabolic Panel: Recent Labs  Lab 02/07/23 1641 02/07/23 1726 02/08/23 1729 02/09/23 0106 02/10/23 0127  NA 131* 131* 132* 135 134*  K 4.1 4.2 4.7 4.6 4.5  CL 94*  --  93* 96* 94*  CO2 31  --  31 33* 31  GLUCOSE 134*  --  144* 112* 122*  BUN 22  --  29* 32* 39*  CREATININE 1.17*  --  1.36* 1.38* 1.34*  CALCIUM 8.8*  --  9.0 8.9 9.0  MG 1.9  --   --  2.1  --    GFR: Estimated Creatinine Clearance: 31.9 mL/min (A) (by C-G formula based on SCr of 1.34 mg/dL (H)). Liver Function Tests: Recent Labs  Lab 02/07/23 1641  AST 35  ALT 23  ALKPHOS 84  BILITOT 0.5  PROT 6.1*  ALBUMIN 3.1*   No results for input(s): "LIPASE", "AMYLASE" in the last 168 hours. No results for input(s): "AMMONIA" in the last 168 hours. Coagulation  Profile: Recent Labs  Lab 02/08/23 1421  INR 1.0   Cardiac Enzymes: No results for input(s): "CKTOTAL", "CKMB", "CKMBINDEX", "TROPONINI" in the last 168 hours. BNP (last 3 results) No results for input(s): "PROBNP" in the last 8760 hours. HbA1C: No results for input(s): "HGBA1C" in the last 72 hours. CBG: No results for input(s): "GLUCAP" in the last 168 hours. Lipid Profile: No results for input(s): "CHOL", "HDL", "LDLCALC", "TRIG", "CHOLHDL", "LDLDIRECT" in the last 72 hours. Thyroid Function Tests: No results for input(s): "TSH", "T4TOTAL", "FREET4", "T3FREE", "THYROIDAB" in the last 72 hours. Anemia Panel: No results for input(s): "VITAMINB12", "FOLATE", "FERRITIN", "TIBC", "IRON", "RETICCTPCT" in the last 72 hours. Urine analysis:    Component Value Date/Time   COLORURINE YELLOW 01/09/2023 0948   APPEARANCEUR CLEAR 01/09/2023 0948   LABSPEC 1.012 01/09/2023 0948   PHURINE 5.0 01/09/2023 0948   GLUCOSEU NEGATIVE 01/09/2023 0948   HGBUR SMALL (A) 01/09/2023 0948   BILIRUBINUR NEGATIVE 01/09/2023 0948   KETONESUR NEGATIVE 01/09/2023 0948   PROTEINUR NEGATIVE 01/09/2023 0948   UROBILINOGEN 0.2 11/04/2013 1321   NITRITE NEGATIVE 01/09/2023 0948   LEUKOCYTESUR NEGATIVE 01/09/2023 0948   Sepsis Labs: @LABRCNTIP (procalcitonin:4,lacticidven:4)  )No results found for this or any previous visit (from the past 240 hour(s)).   Radiology Studies: No results found.   Scheduled Meds:  amLODipine  10 mg Oral Daily   apixaban  5 mg Oral BID   Chlorhexidine Gluconate Cloth  6 each Topical Daily   cloNIDine  0.2 mg Oral BID   furosemide  80 mg Intravenous BID   ipratropium-albuterol  3 mL Nebulization Q6H   pantoprazole  40 mg Oral Daily   sodium chloride  2 g Oral BID WC   spironolactone  12.5 mg Oral Daily   Continuous Infusions:   LOS: 2 days    Time spent:    Zannie Cove, MD [facility]   02/10/2023, 11:27 AM

## 2023-02-11 ENCOUNTER — Inpatient Hospital Stay (HOSPITAL_COMMUNITY): Payer: Medicare Other

## 2023-02-11 DIAGNOSIS — I5033 Acute on chronic diastolic (congestive) heart failure: Secondary | ICD-10-CM | POA: Diagnosis not present

## 2023-02-11 LAB — CBC
HCT: 33.7 % — ABNORMAL LOW (ref 36.0–46.0)
Hemoglobin: 10.7 g/dL — ABNORMAL LOW (ref 12.0–15.0)
MCH: 30.2 pg (ref 26.0–34.0)
MCHC: 31.8 g/dL (ref 30.0–36.0)
MCV: 95.2 fL (ref 80.0–100.0)
Platelets: 242 10*3/uL (ref 150–400)
RBC: 3.54 MIL/uL — ABNORMAL LOW (ref 3.87–5.11)
RDW: 15.5 % (ref 11.5–15.5)
WBC: 9.4 10*3/uL (ref 4.0–10.5)
nRBC: 0 % (ref 0.0–0.2)

## 2023-02-11 LAB — BASIC METABOLIC PANEL
Anion gap: 11 (ref 5–15)
BUN: 38 mg/dL — ABNORMAL HIGH (ref 8–23)
CO2: 31 mmol/L (ref 22–32)
Calcium: 9.6 mg/dL (ref 8.9–10.3)
Chloride: 94 mmol/L — ABNORMAL LOW (ref 98–111)
Creatinine, Ser: 1.28 mg/dL — ABNORMAL HIGH (ref 0.44–1.00)
GFR, Estimated: 41 mL/min — ABNORMAL LOW (ref >60)
Glucose, Bld: 148 mg/dL — ABNORMAL HIGH (ref 70–99)
Potassium: 4.1 mmol/L (ref 3.5–5.1)
Sodium: 136 mmol/L (ref 135–145)

## 2023-02-11 LAB — CULTURE, BODY FLUID W GRAM STAIN -BOTTLE: Culture: NO GROWTH

## 2023-02-11 MED ORDER — ENSURE ENLIVE PO LIQD
237.0000 mL | Freq: Two times a day (BID) | ORAL | Status: DC
  Administered 2023-02-11 – 2023-02-16 (×8): 237 mL via ORAL

## 2023-02-11 MED ORDER — METOLAZONE 2.5 MG PO TABS
2.5000 mg | ORAL_TABLET | Freq: Once | ORAL | Status: AC
  Administered 2023-02-11: 2.5 mg via ORAL
  Filled 2023-02-11: qty 1

## 2023-02-11 MED ORDER — POLYETHYLENE GLYCOL 3350 17 G PO PACK
17.0000 g | PACK | Freq: Every day | ORAL | Status: DC
  Administered 2023-02-11 – 2023-02-16 (×6): 17 g via ORAL
  Filled 2023-02-11 (×5): qty 1

## 2023-02-11 MED ORDER — LIDOCAINE HCL 1 % IJ SOLN
INTRAMUSCULAR | Status: AC
  Filled 2023-02-11: qty 20

## 2023-02-11 MED ORDER — HYDRALAZINE HCL 25 MG PO TABS
25.0000 mg | ORAL_TABLET | Freq: Three times a day (TID) | ORAL | Status: DC
  Administered 2023-02-11 – 2023-02-14 (×12): 25 mg via ORAL
  Filled 2023-02-11 (×12): qty 1

## 2023-02-11 MED ORDER — GUAIFENESIN ER 600 MG PO TB12
600.0000 mg | ORAL_TABLET | Freq: Two times a day (BID) | ORAL | Status: DC
  Administered 2023-02-11 – 2023-02-16 (×10): 600 mg via ORAL
  Filled 2023-02-11 (×10): qty 1

## 2023-02-11 MED ORDER — IPRATROPIUM-ALBUTEROL 0.5-2.5 (3) MG/3ML IN SOLN
3.0000 mL | Freq: Three times a day (TID) | RESPIRATORY_TRACT | Status: DC
  Administered 2023-02-11 – 2023-02-12 (×4): 3 mL via RESPIRATORY_TRACT
  Filled 2023-02-11 (×4): qty 3

## 2023-02-11 NOTE — Progress Notes (Signed)
   02/11/23 1144  Assess: MEWS Score  Temp 99.2 F (37.3 C)  BP (!) 165/52  MAP (mmHg) 85  Pulse Rate 84  ECG Heart Rate 84  Resp (!) 26  Level of Consciousness Alert  SpO2 95 %  O2 Device Nasal Cannula  O2 Flow Rate (L/min) 3 L/min  Assess: MEWS Score  MEWS Temp 0  MEWS Systolic 0  MEWS Pulse 0  MEWS RR 2  MEWS LOC 0  MEWS Score 2  MEWS Score Color Yellow  Assess: if the MEWS score is Yellow or Red  Were vital signs taken at a resting state? Yes  Focused Assessment No change from prior assessment  Does the patient meet 2 or more of the SIRS criteria? No  MEWS guidelines implemented  Yes, yellow  Treat  MEWS Interventions Considered administering scheduled or prn medications/treatments as ordered  Take Vital Signs  Increase Vital Sign Frequency  Yellow: Q2hr x1, continue Q4hrs until patient remains green for 12hrs  Escalate  MEWS: Escalate Yellow: Discuss with charge nurse and consider notifying provider and/or RRT  Notify: Charge Nurse/RN  Name of Charge Nurse/RN Notified Michele Rockers, RN  Provider Notification  Provider Name/Title Dr Jomarie Longs  Date Provider Notified 02/11/23  Time Provider Notified 1145  Method of Notification Page  Notification Reason Other (Comment) (yellow MEWS)  Provider response Evaluate remotely  Assess: SIRS CRITERIA  SIRS Temperature  0  SIRS Pulse 0  SIRS Respirations  1  SIRS WBC 0  SIRS Score Sum  1

## 2023-02-11 NOTE — Progress Notes (Signed)
Mobility Specialist Progress Note:   02/11/23 1220  Mobility  Activity Transferred from bed to chair  Level of Assistance Minimal assist, patient does 75% or more (+2)  Assistive Device Front wheel walker  Distance Ambulated (ft) 3 ft  Activity Response Tolerated well  Mobility Referral Yes  $Mobility charge 1 Mobility  Mobility Specialist Start Time (ACUTE ONLY) 1220  Mobility Specialist Stop Time (ACUTE ONLY) 1231  Mobility Specialist Time Calculation (min) (ACUTE ONLY) 11 min   Pt requesting to transfer to chair for lunch. Required heavy minA+2 to stand and take steps to chair. Pt desat to 83% on 3LO2, required incr to 5LO2, SpO2 90%. Pt left with all needs met, chair alarm on.  Addison Lank Mobility Specialist Please contact via SecureChat or  Rehab office at [phone]

## 2023-02-11 NOTE — Progress Notes (Addendum)
Occupational Therapy Treatment Patient Details Name: [name] MRN: [id number] DOB: Apr 13, 1935 Today's Date: 02/11/2023   History of present illness Pt admitted 6/4 from home, after being discharged from SNF 3 days prior, with hypoxia and edema in abdomen and LEs secondary to CHF exacerbation. 6/6 thoracentesis. Pt with hx of: lumbar compression fractures, R fib head fx treated conservatively, HOH, CHF, colon CA, breast CA, CAD, COPD, CVA, afib.   OT comments  Pt up in chair, ready to return to bed. Agreed to ambulate around from chair around bed. Pt needing mod assist with increased time and encouragement to stand, min assist to ambulate with second person for safety and lines. Pt completed UB bathing and dressing at EOB with min assist. Returned to supine with assist for LEs and positioned pt off buttocks on R side for comfort. Son in room and observing session. Reinforced information provided by case manager regarding options other than SNF. SpO2 91% with ambulation, dropped to 85% in supine momentarily, returned to 91% at rest with HOB up on 3L O2. Discharge plan of HHOT and 24 hour care continues to be appropriate.    Recommendations for follow up therapy are one component of a multi-disciplinary discharge planning process, led by the attending physician.  Recommendations D[place]ma be updated based on patient status, additional functional criteria and insurance authorization.    Assistance Recommended at Discharge Frequent or constant Supervision/Assistance  Patient can return home with the following  A lot of help with walking and/or transfers;A lot of help with bathing/dressing/bathroom;Assistance with cooking/housework;Direct supervision/assist for medications management;Direct supervision/assist for financial management;Assist for transportation;Help with stairs or ramp for entrance   Equipment Recommendations  None recommended by OT    Recommendations for Other Services      Precautions /  Restrictions Precautions Precautions: Fall Precaution Comments: watch O2 Restrictions Weight Bearing Restrictions: No       Mobility Bed Mobility Overal bed mobility: Needs Assistance Bed Mobility: Sit to Supine       Sit to supine: Mod assist   General bed mobility comments: assist for LEs back into bed    Transfers Overall transfer level: Needs assistance Equipment used: Rolling walker (2 wheels) Transfers: Sit to/from Stand Sit to Stand: Mod assist, +2 safety/equipment           General transfer comment: assist to rise and steady, increased time     Balance Overall balance assessment: Needs assistance   Sitting balance-Leahy Scale: Fair     Standing balance support: Bilateral upper extremity supported Standing balance-Leahy Scale: Poor                             ADL either performed or assessed with clinical judgement   ADL Overall ADL's : Needs assistance/impaired         Upper Body Bathing: Minimal assistance;Sitting Upper Body Bathing Details (indicate cue type and reason): assist for back     Upper Body Dressing : Minimal assistance;Sitting Upper Body Dressing Details (indicate cue type and reason): to change gown at EOB         Toileting- Clothing Manipulation and Hygiene: Total assistance;Sit to/from stand       Functional mobility during ADLs: Minimal assistance;+2 for safety/equipment;Rolling walker (2 wheels)      Extremity/Trunk Assessment              Vision       Perception     Praxis  Cognition Arousal/Alertness: Awake/alert Behavior During Therapy: Anxious Overall Cognitive Status: Impaired/Different from baseline Area of Impairment: Safety/judgement                         Safety/Judgement: Decreased awareness of safety     General Comments: attempts to sit prior to being aligned with sitting surface when fatigued        Exercises      Shoulder Instructions       General  Comments      Pertinent Vitals/ Pain       Pain Assessment Pain Assessment: Faces Faces Pain Scale: Hurts little more Pain Location: buttocks Pain Descriptors / Indicators: Sore Pain Intervention(s): Repositioned  Home Living                                          Prior Functioning/Environment              Frequency  Min 2X/week        Progress Toward Goals  OT Goals(current goals can now be found in the care plan section)  Progress towards OT goals: Progressing toward goals  Acute Rehab OT Goals OT Goal Formulation: With patient Time For Goal Achievement: 02/23/23 Potential to Achieve Goals: Good  Plan Discharge plan remains appropriate    Co-evaluation          OT goals addressed during session: ADL's and self-care      AM-PAC OT "6 Clicks" Daily Activity     Outcome Measure   Help from another person eating meals?: None Help from another person taking care of personal grooming?: A Little Help from another person toileting, which includes using toliet, bedpan, or urinal?: Total Help from another person bathing (including washing, rinsing, drying)?: A Lot Help from another person to put on and taking off regular upper body clothing?: A Little Help from another person to put on and taking off regular lower body clothing?: Total 6 Click Score: 14    End of Session Equipment Utilized During Treatment: Gait belt;Rolling walker (2 wheels);Oxygen (3L)  OT Visit Diagnosis: Unsteadiness on feet (R26.81);Other abnormalities of gait and mobility (R26.89);Muscle weakness (generalized) (M62.81);Other (comment) (decreased activity tolerance)   Activity Tolerance Patient tolerated treatment well   Patient Left in bed;with call bell/phone within reach;with bed alarm set;with family/visitor present   Nurse Communication Mobility status        Time: 2130-8657 OT Time Calculation (min): 32 min  Charges: OT General Charges $OT Visit: 1  Visit OT Treatments $Self Care/Home Management : 8-22 mins  [name], OTR/L Acute Rehabilitation Services Office: [phone]   [name], [name] 02/11/2023, 3:13 PM

## 2023-02-11 NOTE — Progress Notes (Addendum)
PROGRESS NOTE    [name]  BJY:[id number] DOB: 01-Oct-1934 DOA: 02/07/2023 PCP: Donita Brooks, MD   88/F w/aortic stenosis, hypertension, CAD, atrial fibrillation, pulmonary hypertension who presented with dyspnea. Reported four weeks of worsening dyspnea, orthopnea and 40 lbs weight gain.  -Recent hospitalization 5/4-5/13 for closed compression fracture L5 and closed right fibular fracture.  -admitted 6/3 w/ respiratory distress and hypoxia. -CXR w/cardiomegaly with bilateral hilar vascular congestion, bilateral increased lung markings and bilateral pleural effusions.     Subjective: -Feels better overall today,  Assessment and Plan:  Acute on chronic diastolic heart failure (HCC) Severe pulmonary hypertension Pleural effusion Echo with EF 65 to 70%, mild LVH, RV preserved, severely elevated PA systolic pressures, severe TR, moderate aortic stenosis.  -CTA chest with moderate to large bilateral pleural effusions, albumin is 3.1 -Finally starting to diurese more, 3.9 L negative, continue IV Lasix 1 more day  -Underwent left thoracentesis yesterday, 600 mL, not enough fluid on the right today per IR  -Resume apixaban -Continue Aldactone, amlodipine, clonidine -Appreciate CHF team input, palliative care was suggested -PT eval completed, home health recommended   Persistent atrial fibrillation (HCC) Rate controlled.  In A-fib at this time -Resumed apixaban   Coronary artery disease -stable   Essential hypertension -Improving, meds as above   Anemia of chronic disease -stable with Hgb at 11.6   COPD (chronic obstructive pulmonary disease) (HCC) -continue bronchodilator therapy.    GERD (gastroesophageal reflux disease) Continue antiacid therapy.   Closed compression fracture of L5 lumbar vertebra, initial encounter (HCC) -pain control, PT and Ot/.   Lung nodules -needs FU  Thyroid nodules -needs FU   Pressure injury of skin Continue local care  DVT  prophylaxis: Apixaban Code Status: DNR Family Communication: No family at bedside Disposition Plan: [facility] need SNF  Consultants:    Procedures:   Antimicrobials:    Objective: Vitals:   02/10/23 2326 02/11/23 0205 02/11/23 0735 02/11/23 0818  BP: (!) 195/82 (!) (P) 142/60 (!) 163/55   Pulse: 98  71   Resp: (!) 24  20   Temp: 98.5 F (36.9 C) (P) 98.7 F (37.1 C) (!) 97.5 F (36.4 C)   TempSrc: Oral (P) Oral Oral   SpO2: 91%  91% 97%  Weight:      Height:        Intake/Output Summary (Last 24 hours) at 02/11/2023 1144 Last data filed at 02/11/2023 0865 Gross per 24 hour  Intake 340 ml  Output 2900 ml  Net -2560 ml   Filed Weights   02/08/23 1353 02/09/23 0457 02/10/23 0349  Weight: 78.9 kg 83.1 kg 82.3 kg    Examination:  General exam: Frail elderly female sitting up in bed, AAOx3 CVS: S1-S2, regular rhythm, loud systolic murmur Lungs: Improved breath sounds on the left, decreased on the right Abdomen: Soft, nontender, bowel sounds present Extremities: 1+ edema  Skin: No rashes Psychiatry:  Mood & affect appropriate.     Data Reviewed:   CBC: Recent Labs  Lab 02/07/23 1641 02/07/23 1726 02/10/23 0127 02/11/23 0046  WBC 6.8  --  7.2 9.4  NEUTROABS 3.8  --   --   --   HGB 10.6* 11.6* 9.7* 10.7*  HCT 35.0* 34.0* 31.7* 33.7*  MCV 99.2  --  97.5 95.2  PLT 223  --  191 242   Basic Metabolic Panel: Recent Labs  Lab 02/07/23 1641 02/07/23 1726 02/08/23 1729 02/09/23 0106 02/10/23 0127 02/11/23 0046  NA 131* 131* 132*  135 134* 136  K 4.1 4.2 4.7 4.6 4.5 4.1  CL 94*  --  93* 96* 94* 94*  CO2 31  --  31 33* 31 31  GLUCOSE 134*  --  144* 112* 122* 148*  BUN 22  --  29* 32* 39* 38*  CREATININE 1.17*  --  1.36* 1.38* 1.34* 1.28*  CALCIUM 8.8*  --  9.0 8.9 9.0 9.6  MG 1.9  --   --  2.1  --   --    GFR: Estimated Creatinine Clearance: 33.4 mL/min (A) (by C-G formula based on SCr of 1.28 mg/dL (H)). Liver Function Tests: Recent Labs  Lab  02/07/23 1641 02/10/23 1556  AST 35 19  ALT 23 17  ALKPHOS 84 74  BILITOT 0.5 0.6  PROT 6.1* 5.7*  ALBUMIN 3.1* 2.8*   No results for input(s): "LIPASE", "AMYLASE" in the last 168 hours. No results for input(s): "AMMONIA" in the last 168 hours. Coagulation Profile: Recent Labs  Lab 02/08/23 1421  INR 1.0   Cardiac Enzymes: No results for input(s): "CKTOTAL", "CKMB", "CKMBINDEX", "TROPONINI" in the last 168 hours. BNP (last 3 results) No results for input(s): "PROBNP" in the last 8760 hours. HbA1C: No results for input(s): "HGBA1C" in the last 72 hours. CBG: No results for input(s): "GLUCAP" in the last 168 hours. Lipid Profile: No results for input(s): "CHOL", "HDL", "LDLCALC", "TRIG", "CHOLHDL", "LDLDIRECT" in the last 72 hours. Thyroid Function Tests: No results for input(s): "TSH", "T4TOTAL", "FREET4", "T3FREE", "THYROIDAB" in the last 72 hours. Anemia Panel: No results for input(s): "VITAMINB12", "FOLATE", "FERRITIN", "TIBC", "IRON", "RETICCTPCT" in the last 72 hours. Urine analysis:    Component Value Date/Time   COLORURINE YELLOW 01/09/2023 0948   APPEARANCEUR CLEAR 01/09/2023 0948   LABSPEC 1.012 01/09/2023 0948   PHURINE 5.0 01/09/2023 0948   GLUCOSEU NEGATIVE 01/09/2023 0948   HGBUR SMALL (A) 01/09/2023 0948   BILIRUBINUR NEGATIVE 01/09/2023 0948   KETONESUR NEGATIVE 01/09/2023 0948   PROTEINUR NEGATIVE 01/09/2023 0948   UROBILINOGEN 0.2 11/04/2013 1321   NITRITE NEGATIVE 01/09/2023 0948   LEUKOCYTESUR NEGATIVE 01/09/2023 0948   Sepsis Labs: @LABRCNTIP (procalcitonin:4,lacticidven:4)  ) Recent Results (from the past 240 hour(s))  Gram stain     Status: None   Collection Time: 02/10/23 12:04 PM   Specimen: Pleura  Result Value Ref Range Status   Specimen Description PLEURAL  Final   Special Requests NONE  Final   Gram Stain   Final    MODERATE WBC PRESENT, PREDOMINANTLY MONONUCLEAR RARE SQUAMOUS EPITHELIAL CELLS PRESENT NO ORGANISMS SEEN Performed  at [facility], [address], [place], [place] [address]    Report Status 02/10/2023 FINAL  Final     Radiology Studies: IR ABDOMEN US LIMITED  Result Date: 02/11/2023 CLINICAL DATA:  88 year old female referred for possible thoracentesis EXAM: LIMITED CHEST ULTRASOUND FOR PLEURAL FLUID TECHNIQUE: Limited ultrasound survey for pleural effusion was formed. COMPARISON:  CT 02/08/2023 FINDINGS: Ultrasound of the right chest demonstrates small volume pleural effusion without safe window for access. IMPRESSION: Small volume right pleural fluid. Electronically Signed   By: Gilmer Mor D.O.   On: 02/11/2023 09:32   IR THORACENTESIS ASP PLEURAL SPACE W/IMG GUIDE  Result Date: 02/10/2023 INDICATION: Dyspnea with fluid overload and bilateral pleural effusions. Request for diagnostic and therapeutic thoracentesis. EXAM: ULTRASOUND GUIDED LEFT THORACENTESIS MEDICATIONS: 1% lidocaine 10 mL COMPLICATIONS: None immediate. PROCEDURE: An ultrasound guided thoracentesis was thoroughly discussed with the patient and questions answered. The benefits, risks, alternatives and  complications were also discussed. The patient understands and wishes to proceed with the procedure. Written consent was obtained. Ultrasound was performed to localize and mark an adequate pocket of fluid in the left chest. The area was then prepped and draped in the normal sterile fashion. 1% Lidocaine was used for local anesthesia. Under ultrasound guidance a 6 Fr Safe-T-Centesis catheter was introduced. Thoracentesis was performed. The catheter was removed and a dressing applied. FINDINGS: A total of approximately 600 mL of clear yellow fluid was removed. Samples were sent to the laboratory as requested by the clinical team. IMPRESSION: Successful ultrasound guided left thoracentesis yielding 600 mL of pleural fluid. No pneumothorax on post-procedure chest x-ray. Procedure performed by: Corrin Parker, PA-C Electronically Signed   By: Marliss Coots M.D.   On: 02/10/2023 14:01   DG Chest 1 View  Result Date: 02/10/2023 CLINICAL DATA:  Shortness of breath.  Status post thoracentesis EXAM: CHEST  1 VIEW COMPARISON:  02/07/2023 FINDINGS: Stable cardiomediastinal contours. Bilateral pleural effusions are again noted. Compared with the previous exam the left pleural effusion appears decreased in the interval. No significant pneumothorax status post thoracentesis. Mild diffuse increase interstitial markings. Decreased aeration to both lung bases compatible with atelectasis. IMPRESSION: 1. Decrease in left pleural effusion status post thoracentesis. No pneumothorax. 2. Persistent bilateral pleural effusions and increased interstitial markings compatible with pulmonary edema. Electronically Signed   By: Signa Kell M.D.   On: 02/10/2023 12:21     Scheduled Meds:  amLODipine  10 mg Oral Daily   apixaban  5 mg Oral BID   Chlorhexidine Gluconate Cloth  6 each Topical Daily   cloNIDine  0.2 mg Oral BID   furosemide  80 mg Intravenous BID   hydrALAZINE  25 mg Oral TID   ipratropium-albuterol  3 mL Nebulization TID   pantoprazole  40 mg Oral Daily   sodium chloride  2 g Oral BID WC   spironolactone  12.5 mg Oral Daily   Continuous Infusions:   LOS: 3 days    Time spent:    Zannie Cove, MD [facility]   02/11/2023, 11:44 AM

## 2023-02-11 NOTE — Progress Notes (Signed)
OT Cancellation Note  Patient Details Name: [name] MRN: [id number] DOB: November 10, 1934   Cancelled Treatment:    Reason Eval/Treat Not Completed: Fatigue/lethargy limiting ability to participate (Per family, pt awake until 4 am, sleeping soundly.) Will return as schedule allows.   Nicolle, Heward 02/11/2023, 11:25 AM Berna Spare, OTR/L Acute Rehabilitation Services Office: [phone]

## 2023-02-11 NOTE — Evaluation (Signed)
Clinical/Bedside Swallow Evaluation Patient Details  Name: [name] MRN: [id number] Date of Birth: July 28, 1935  Today's Date: 02/11/2023 Time: SLP Start Time (ACUTE ONLY): 1255 SLP Stop Time (ACUTE ONLY): 1320 SLP Time Calculation (min) (ACUTE ONLY): 25 min  Past Medical History:  Past Medical History:  Diagnosis Date   Allergy    rhinitis   Aortic stenosis    mild by echo 06/2017   Arthritis    Bradycardia    a. 10/2017 -> beta blocker cut back due to HR 39.   Breast cancer (HCC) 01/06/2012   Cancer (HCC)    right colon and left breast   Chronic diastolic CHF (congestive heart failure) (HCC)    Colon cancer (HCC) 01/06/2012   Colovesical fistula    Dr. Carolynne Edouard and Dr. Laverle Patter planning surgery 08/2018- surgery revealed spontaneous closure   COPD (chronic obstructive pulmonary disease) (HCC)    pt. denies   Coronary artery disease 2006   a.  NSTEMI in 2016, cath showed 15% prox-mid RCA, 20% prox LAD, EF 25-35% by cath and 35-40% -> felt due to Takotsubo cardiomyopathy.   Dilated aortic root (HCC)    by echo 06/2017   Diverticulosis    Dyspnea    Edema extremities    GERD (gastroesophageal reflux disease)    Hernia    Hiatal hernia    denies   Hyperlipidemia    Hypertension    Mild aortic stenosis    echo 11/2015 but not noted on echo 06/2016   Osteopenia    Permanent atrial fibrillation (HCC)    chronic atrial fibrillation   Pneumonia    hx child   Pulmonary HTN (HCC)    a. moderate to severe PASP echo 11/2015 - now by echo 06/2017. CTA chest in 11/16 with no PE. PFTs in 7/15 with mild obstructive lung disease. She had a negative sleep study in 2017. b. Felt due to left sided HF.   Stroke [facility])    Takotsubo syndrome 07/29/2015   a. EF 35-40% by echo; akinesis of mid-apical anteroseptal and apical myocardium.  EF now normalized on echo 11/2015   Past Surgical History:  Past Surgical History:  Procedure Laterality Date   APPENDECTOMY     BIOPSY  01/15/2023    Procedure: BIOPSY;  Surgeon: Lynann Bologna, DO;  Location: [facility];  Service: Gastroenterology;;   BREAST SURGERY     lumpectomy left   CARDIAC CATHETERIZATION     CARDIAC CATHETERIZATION N/A 07/28/2015   Procedure: Left Heart Cath and Coronary Angiography;  Surgeon: Peter M Swaziland, MD;  Location: [facility] INVASIVE CV LAB;  Service: Cardiovascular;  Laterality: N/A;   CHOLECYSTECTOMY     COLECTOMY     right side   CYSTOSCOPY WITH STENT PLACEMENT Left 09/04/2018   Procedure: CYSTOSCOPY WITH LEFT STENT PLACEMENT, BLADDER REPAIR, CYSTOSCOPY WITH LEFT STENT REMOVAL;  Surgeon: Heloise Purpura, MD;  Location: [facility] ORS;  Service: Urology;  Laterality: Left;   ENTEROSCOPY N/A 01/16/2023   Procedure: ENTEROSCOPY;  Surgeon: Lynann Bologna, DO;  Location: [facility];  Service: Gastroenterology;  Laterality: N/A;   ESOPHAGOGASTRODUODENOSCOPY (EGD) WITH PROPOFOL N/A 01/15/2023   Procedure: ESOPHAGOGASTRODUODENOSCOPY (EGD) WITH PROPOFOL;  Surgeon: Lynann Bologna, DO;  Location: [facility];  Service: Gastroenterology;  Laterality: N/A;   EXCISION OF ACCESSORY NIPPLE Bilateral 05/30/2013   Procedure: BILATERAL NIPPLE BIOPSY;  Surgeon: Robyne Askew, MD;  Location: [facility] OR;  Service: General;  Laterality: Bilateral;   EYE SURGERY Bilateral  12   cataracts   HOT HEMOSTASIS N/A 01/15/2023   Procedure: HOT HEMOSTASIS (ARGON PLASMA COAGULATION/BICAP);  Surgeon: Lynann Bologna, DO;  Location: [facility];  Service: Gastroenterology;  Laterality: N/A;   HYSTEROSCOPY WITH D & C N/A 05/29/2020   Procedure: DILATATION AND CURETTAGE /HYSTEROSCOPY;  Surgeon: Natale Milch, MD;  Location: [facility] ORS;  Service: Gynecology;  Laterality: N/A;   IR KYPHO EA ADDL LEVEL THORACIC OR LUMBAR  11/22/2022   IR KYPHO LUMBAR INC FX REDUCE BONE BX UNI/BIL CANNULATION INC/IMAGING  11/22/2022   IR RADIOLOGIST EVAL & MGMT  07/26/2017   IR THORACENTESIS ASP PLEURAL SPACE W/IMG GUIDE  02/10/2023   LAPAROSCOPIC RIGHT  COLECTOMY N/A 09/04/2018   Procedure: LAPAROSCOPIC ASSISTED SIGMOID COLECTOMY WITH REPAIR OF FISTULA TO BLADDER;  Surgeon: Griselda Miner, MD;  Location: [facility] ORS;  Service: General;  Laterality: N/A;   RIGHT/LEFT HEART CATH AND CORONARY ANGIOGRAPHY N/A 12/10/2020   Procedure: RIGHT/LEFT HEART CATH AND CORONARY ANGIOGRAPHY;  Surgeon: Laurey Morale, MD;  Location: [facility] INVASIVE CV LAB;  Service: Cardiovascular;  Laterality: N/A;   SPLIT NIGHT STUDY  02/02/2016       HPI:  [name] is a 88 y.o. female with medical history significant of aortic stenosis, heart failure with preserved ejection fraction, hypertension, hyperlipidemia, CAD, permanent A-fib, pulmonary hypertension, prior stroke who presented to the emergency department due to shortness of breath.  Patient states that over the last 1 month she has been having progressively worsening shortness of breath and cough. She has also gained 40lbs.  She states that she has difficulty laying flat as well as being unable to walk more than a couple meters without having to stop.  She called EMS due to mild respiratory distress.  They found her hypoxic in the 80s and brought her to the ER for further assessment.  On arrival she was bradycardic in the 50s and satting well on nasal cannula.  Labs were obtained which showed sodium 131, creatinine 1.1, WBC 6.8, hemoglobin 10.6, troponin 78, 67, BNP 473, chest x-ray showed cardiomegaly with pulmonary vascular congestion as well as opacities in the lungs and a left-sided pleural effusion.  Patient was admitted for further workup and started on IV diuresis.     On admission she was mild labored breathing on nasal cannula.  She denied any fever or chills however has had a productive cough.  She has bilateral swelling in her lower extremities and endorses weight gain. CXR on 02/10/23 indicated  Decrease in left pleural effusion status post thoracentesis. No  pneumothorax.  2. Persistent bilateral pleural effusions and  increased interstitial  markings compatible with pulmonary edema  ST consulted for clinical swallowing evaluation.    Assessment / Plan / Recommendation  Clinical Impression  Pt seen for clinical swallowing evaluation with mild oral preparation d/t presence of ill-fitting dentures impacting mastication with solids. Breathing/swallowing reciprocity also influencing performance and this was discussed with pt/family d/t oxygen requirement (new, 3L) and increased WOB.  Pt given swallowing precautions including slow rate, small bites/sips for pacing intake and encouraged to eat foods that are pt preferred and easier to consume to conserve energy during meals/snacks. Discussed esophageal precautions with pt as well d/t hx of GERD/hiatal hernia and reports of globus sensation since completion of EGD prior hospitalization. Continue Dysphagia 2(minced)/thin liquids with above mentioned precautions and ST will f/u acutely for diet tolerance/education.  Thank you for this consult. SLP Visit Diagnosis: Dysphagia, oral phase (R13.11)  Aspiration Risk  Mild aspiration risk    Diet Recommendation   Dysphagia 2/thin liquids  Medication Administration: Whole meds with puree    Other  Recommendations Oral Care Recommendations: Oral care BID    Recommendations for follow up therapy are one component of a multi-disciplinary discharge planning process, led by the attending physician.  Recommendations [name] be updated based on patient status, additional functional criteria and insurance authorization.  Follow up Recommendations Follow physician's recommendations for discharge plan and follow up therapies      Assistance Recommended at Discharge  TBD  Functional Status Assessment Patient has had a recent decline in their functional status and demonstrates the ability to make significant improvements in function in a reasonable and predictable amount of time.  Frequency and Duration min 1 x/week  1 week        Prognosis Prognosis for improved oropharyngeal function: Good      Swallow Study   General Date of Onset: 02/07/23 HPI: [name] is a 88 y.o. female with medical history significant of aortic stenosis, heart failure with preserved ejection fraction, hypertension, hyperlipidemia, CAD, permanent A-fib, pulmonary hypertension, prior stroke who presented to the emergency department due to shortness of breath.  Patient states that over the last 1 month she has been having progressively worsening shortness of breath and cough. She has also gained 40lbs.  She states that she has difficulty laying flat as well as being unable to walk more than a couple meters without having to stop.  She called EMS due to mild respiratory distress.  They found her hypoxic in the 80s and brought her to the ER for further assessment.  On arrival she was bradycardic in the 50s and satting well on nasal cannula.  Labs were obtained which showed sodium 131, creatinine 1.1, WBC 6.8, hemoglobin 10.6, troponin 78, 67, BNP 473, chest x-ray showed cardiomegaly with pulmonary vascular congestion as well as opacities in the lungs and a left-sided pleural effusion.  Patient was admitted for further workup and started on IV diuresis.     On admission she was mild labored breathing on nasal cannula.  She denied any fever or chills however has had a productive cough.  She has bilateral swelling in her lower extremities and endorses weight gain. CXR on 02/10/23 indicated  Decrease in left pleural effusion status post thoracentesis. No  pneumothorax.  2. Persistent bilateral pleural effusions and increased interstitial  markings compatible with pulmonary edema  ST consulted for clinical swallowing evaluation. Type of Study: Bedside Swallow Evaluation Previous Swallow Assessment: n/a Diet Prior to this Study: Dysphagia 2 (finely chopped);Thin liquids (Level 0) Temperature Spikes Noted: Yes (low grade, 99) Respiratory Status: Nasal cannula;Other  (comment) (3L) History of Recent Intubation: No Behavior/Cognition: Alert;Cooperative Oral Cavity Assessment: Within Functional Limits Oral Care Completed by SLP: Recent completion by staff Oral Cavity - Dentition: Dentures, top;Dentures, bottom;Other (Comment) (ill-fitting) Vision: Functional for self-feeding Self-Feeding Abilities: Able to feed self Patient Positioning: Upright in chair Baseline Vocal Quality: Breathy;Low vocal intensity;Other (comment) (d/t SOB) Volitional Cough: Strong Volitional Swallow: Able to elicit    Oral/Motor/Sensory Function Overall Oral Motor/Sensory Function: Within functional limits   Ice Chips Ice chips: Not tested   Thin Liquid Thin Liquid: Impaired Presentation: Straw Other Comments: Swallowing/breathing reciprocity required small bites/sips/pacing    Nectar Thick Nectar Thick Liquid: Not tested   Honey Thick Honey Thick Liquid: Not tested   Puree Puree: Within functional limits Presentation: Spoon Other Comments: Pt required small bites/sips d/t breathing/swallowing  reciprocity concerns   Solid     Solid: Impaired Presentation: Self Fed Oral Phase Impairments: Impaired mastication Oral Phase Functional Implications: Prolonged oral transit;Impaired mastication (impulsivity impacting intake (decreased sustained attention)) Other Comments: Ill-fitting dentures      [name],M.S., CCC-SLP 02/11/2023,2:58 PM

## 2023-02-11 NOTE — Progress Notes (Signed)
Pt was brought to [place] for diagnostic and therapeutic R thoracentesis. Upon US examination, there was a very small fluid pocket on R that was not large enough to safely access. She underwent L thoracentesis yesterday.  Exam was ended and explained to patient.  Patient was in agreement with findings.      Alex Gardener, AGNP-BC 02/11/2023, 9:21 AM

## 2023-02-11 NOTE — TOC Progression Note (Addendum)
Transition of Care [facility]) - Progression Note    Patient Details  Name: [name] MRN: [id number] Date of Birth: 02/13/1935  Transition of Care [facility] CM/SW Contact  Elliot Cousin, RN Phone Number: [phone] 02/11/2023, 1:02 PM  Clinical Narrative:   Readmission, HF TOC CM spoke to, niece and son, [name]. Plan is to dc home with 24 hour caregivers. Son has arranged for pt to have 24 hour caregivers. Pt will need home oxygen, and nebulizer machine. Son declines wheelchair. States he has hospital bed, RW, rollator, and cane at home. Will need oxygen, neb machie and HH RN, PT and aide orders with [facility]. Will need updated ambulating oyxgen sats.   She has been at SNF at [facility] in the past. She does not want to go back to SNF. Son Maxton interested in ALF. Discussed [facility]. Provided son with brochures and CM contact information.     Expected Discharge Plan: Home w Home Health Services Barriers to Discharge: Continued Medical Work up  Expected Discharge Plan and Services In-house Referral: NA Discharge Planning Services: CM Consult Post Acute Care Choice: Home Health Living arrangements for the past 2 months: Single Family Home                 DME Arranged: N/A DME Agency: NA       HH Arranged: RN, PT, Nurse's Aide HH Agency: [facility] Date HH Agency Contacted: 02/10/23 Time HH Agency Contacted: 1425 Representative spoke with at [facility] Agency: Haywood Lasso   Social Determinants of Health (SDOH) Interventions SDOH Screenings   Food Insecurity: No Food Insecurity (02/08/2023)  Housing: Low Risk  (02/08/2023)  Transportation Needs: No Transportation Needs (02/08/2023)  Utilities: Not At Risk (02/08/2023)  Alcohol Screen: Low Risk  (11/08/2022)  Depression (PHQ2-9): Low Risk  (12/16/2022)  Financial Resource Strain: Low Risk  (11/08/2022)  Recent Concern: Financial Resource Strain - Medium Risk (11/04/2022)  Physical Activity: Sufficiently Active (11/08/2022)   Recent Concern: Physical Activity - Insufficiently Active (08/11/2022)  Social Connections: Moderately Integrated (11/08/2022)  Recent Concern: Social Connections - Moderately Isolated (08/11/2022)  Stress: No Stress Concern Present (11/08/2022)  Tobacco Use: Low Risk  (02/10/2023)    Readmission Risk Interventions    02/10/2023    2:19 PM  Readmission Risk Prevention Plan  Transportation Screening Complete  Medication Review (RN Care Manager) Complete  PCP or Specialist appointment within 3-5 days of discharge Complete  HRI or Home Care Consult Complete  Palliative Care Screening Not Applicable  Skilled Nursing Facility Not Applicable

## 2023-02-11 NOTE — Progress Notes (Signed)
Approximately 1600-- Foley catheter removed to attempt void trial. Peri care and foley care performed. Purewick placed. RN to continue to monitor pt output.

## 2023-02-11 NOTE — Progress Notes (Addendum)
Advanced Heart Failure Rounding Note  PCP-Cardiologist: Armanda Magic, MD   Subjective:    Diuresing with IV lasix. Weight down 2lbs. -2.1L UOP.   S/p L thoracentesis yesterday. - . Plan for right today.   Breathing has much improved today. Hopes swelling in legs starts to go down so she can be more mobile, improving.   Objective:   Weight Range: 82.3 kg Body mass index is 30.2 kg/m.   Vital Signs:   Temp:  [97.5 F (36.4 C)-98.9 F (37.2 C)] 97.5 F (36.4 C) (06/07 0735) Pulse Rate:  [67-98] 71 (06/07 0735) Resp:  [20-35] 20 (06/07 0735) BP: (149-195)/(51-82) 163/55 (06/07 0735) SpO2:  [91 %-97 %] 97 % (06/07 0818) Last BM Date : 02/06/23  Weight change: Filed Weights   02/08/23 1353 02/09/23 0457 02/10/23 0349  Weight: 78.9 kg 83.1 kg 82.3 kg    Intake/Output:   Intake/Output Summary (Last 24 hours) at 02/11/2023 0830 Last data filed at 02/10/2023 2049 Gross per 24 hour  Intake 240 ml  Output 2125 ml  Net -1885 ml   Physical Exam    General:  elderly appearing.   HEENT: normal, 2L [place] Neck: supple. JVD ~9 cm. Carotids 2+ bilat; no bruits. No lymphadenopathy or thyromegaly appreciated. Cor: PMI nondisplaced. Regular rate & irregular rhythm. No rubs, gallops or murmurs. Lungs: diminished bases R>L Abdomen: soft, nontender, nondistended. No hepatosplenomegaly. No bruits or masses. Good bowel sounds. Extremities: no cyanosis, clubbing, rash. Non-pitting BLE edema. + Ted hose Neuro: alert & oriented x 3, cranial nerves grossly intact. moves all 4 extremities w/o difficulty. Affect pleasant.   Telemetry   A fib 70s (Personally reviewed)    EKG    No new EKG today  Labs    CBC Recent Labs    02/10/23 0127 02/11/23 0046  WBC 7.2 9.4  HGB 9.7* 10.7*  HCT 31.7* 33.7*  MCV 97.5 95.2  PLT 191 242   Basic Metabolic Panel Recent Labs    16/10/96 0106 02/10/23 0127 02/11/23 0046  NA 135 134* 136  K 4.6 4.5 4.1  CL 96* 94* 94*  CO2 33* 31 31   GLUCOSE 112* 122* 148*  BUN 32* 39* 38*  CREATININE 1.38* 1.34* 1.28*  CALCIUM 8.9 9.0 9.6  MG 2.1  --   --    Liver Function Tests Recent Labs    02/10/23 1556  AST 19  ALT 17  ALKPHOS 74  BILITOT 0.6  PROT 5.7*  ALBUMIN 2.8*   No results for input(s): "LIPASE", "AMYLASE" in the last 72 hours. Cardiac Enzymes No results for input(s): "CKTOTAL", "CKMB", "CKMBINDEX", "TROPONINI" in the last 72 hours.  BNP: BNP (last 3 results) Recent Labs    11/18/22 0635 12/14/22 1246 02/07/23 1641  BNP 136.1* 117.9* 473.9*    ProBNP (last 3 results) No results for input(s): "PROBNP" in the last 8760 hours.   D-Dimer No results for input(s): "DDIMER" in the last 72 hours. Hemoglobin A1C No results for input(s): "HGBA1C" in the last 72 hours. Fasting Lipid Panel No results for input(s): "CHOL", "HDL", "LDLCALC", "TRIG", "CHOLHDL", "LDLDIRECT" in the last 72 hours. Thyroid Function Tests No results for input(s): "TSH", "T4TOTAL", "T3FREE", "THYROIDAB" in the last 72 hours.  Invalid input(s): "FREET3"  Other results:  Imaging  IR THORACENTESIS ASP PLEURAL SPACE W/IMG GUIDE  Result Date: 02/10/2023 INDICATION: Dyspnea with fluid overload and bilateral pleural effusions. Request for diagnostic and therapeutic thoracentesis. EXAM: ULTRASOUND GUIDED LEFT THORACENTESIS MEDICATIONS: 1% lidocaine 10 mL  COMPLICATIONS: None immediate. PROCEDURE: An ultrasound guided thoracentesis was thoroughly discussed with the patient and questions answered. The benefits, risks, alternatives and complications were also discussed. The patient understands and wishes to proceed with the procedure. Written consent was obtained. Ultrasound was performed to localize and mark an adequate pocket of fluid in the left chest. The area was then prepped and draped in the normal sterile fashion. 1% Lidocaine was used for local anesthesia. Under ultrasound guidance a 6 Fr Safe-T-Centesis catheter was introduced.  Thoracentesis was performed. The catheter was removed and a dressing applied. FINDINGS: A total of approximately 600 mL of clear yellow fluid was removed. Samples were sent to the laboratory as requested by the clinical team. IMPRESSION: Successful ultrasound guided left thoracentesis yielding 600 mL of pleural fluid. No pneumothorax on post-procedure chest x-ray. Procedure performed by: Susan Parker, PA-C Electronically Signed   By: [name] M.D.   On: 02/10/2023 14:01   DG Chest 1 View  Result Date: 02/10/2023 CLINICAL DATA:  Shortness of breath.  Status post thoracentesis EXAM: CHEST  1 VIEW COMPARISON:  02/07/2023 FINDINGS: Stable cardiomediastinal contours. Bilateral pleural effusions are again noted. Compared with the previous exam the left pleural effusion appears decreased in the interval. No significant pneumothorax status post thoracentesis. Mild diffuse increase interstitial markings. Decreased aeration to both lung bases compatible with atelectasis. IMPRESSION: 1. Decrease in left pleural effusion status post thoracentesis. No pneumothorax. 2. Persistent bilateral pleural effusions and increased interstitial markings compatible with pulmonary edema. Electronically Signed   By: Signa Kell M.D.   On: 02/10/2023 12:21    Medications:     Scheduled Medications:  amLODipine  10 mg Oral Daily   apixaban  5 mg Oral BID   Chlorhexidine Gluconate Cloth  6 each Topical Daily   cloNIDine  0.2 mg Oral BID   furosemide  80 mg Intravenous BID   ipratropium-albuterol  3 mL Nebulization TID   pantoprazole  40 mg Oral Daily   sodium chloride  2 g Oral BID WC   spironolactone  12.5 mg Oral Daily    Infusions:   PRN Medications: acetaminophen **OR** acetaminophen, famotidine, hydrALAZINE, ipratropium-albuterol, ondansetron **OR** ondansetron (ZOFRAN) IV, ondansetron, mouth rinse, oxyCODONE, ranolazine, sodium chloride Patient Profile  88 y.o. with history of poorly controlled HTN, chronic  atrial fibrillation, chronic diastolic CHF, and prior episode of Takotsubo cardiomyopathy. AHF team asked to see for acute on chronic systolic heart failure.  Assessment/Plan  1. Chronic diastolic CHF: NYHA class III symptoms, slowly progressive over time.  Mild volume overload on exam.  RHC/LHC in 4/22 showed nonobstructive CAD; mean RA 5, PA 44/10, mean PCWP 20, CI 4.89, PVR < 1 WU (pulmonary venous hypertension).  Echo in 8/22 showed EF up to 65-70%, mild LVH, normal RV, PASP 67, moderate RAE, trivial MR. PYP scan in 9/22 was probably negative. Echo in 7/23 showed EF 60-65%, mild LVH, moderate RV enlargement, normal RV systolic function, PASP 50 mmHg, mild mitral stenosis with mean gradient 4 mmHg, mild-moderate AS, IVC normal. NYHA class III-IV symptoms, weight up 21 lbs since 01/04/23.  - echo 02/08/23: EF 65/70%, mild concentric LVH, normal RV, severely elevated PASP, LA/RA severely dilated, mos aortic stenosis - Continue diuresis with 80 IV lasix BID, SCr improving.  - Off spiro and losartan with hyperkalemia hx, spiro started this admission by primary, will continue for now. Would not increase, low threshold to stop.  - off BB with bradycardia - Unable to tolerate Jardiance due to frequent UTIs.  -  strict I&O, daily weights  - she has declined Cardiomems - will need repeat PYP once stable 2.  CAD: Nonobstructive on 4/22 cath. She has been intolerant of statins and Zetia.   She has had atypical chest pain in the past. Admission 7/23 with CP, elevated HsT, CT ruled out acute aortic syndrome, echo ok. Pain felt to be related to coronary microvascular dysfunction.   - No ASA given apixaban use.  - Intolerant of statins and Zetia 3. Chronic atrial fibrillation/bradycardia: She is off nodal blockade. - Continue Eliquis 5 mg bid 4. HTN: BP elevated - Off spironolactone and losartan with hyperkalemia - Continue amlodipine 10 mg daily - Continue Clonidine 0.2 mg BID - Start hydralazine 25 mg TID 5.  Aortic stenosis: Mild-moderate on last echo.  - mod on echo 6/4  6. Bilateral pleural effusions -  CT chest 02/08/23: Bilateral moderate to large pleural effusions with moderate to marked severity bilateral lower lobe compressive atelectasis. - contributing to SOB, very tachypneic this morning - IR consulted. - L drained 6/6, - . Plan for R today.  7. Physical Deconditioning - recent fall with closed compression fracture of L5 vertebra. - Continue working with PT/OT 8. Hx of Hyperkalemia - stable 4.1 today - spiro started this admission, would be very careful with history of hyperkalemia. Would continue current dose of 12.5 and not increase. Low threshold to stop.  - follow daily BMET  Length of Stay: 3  Susan Bleacher, NP  02/11/2023, 8:30 AM  Advanced Heart Failure Team Pager [phone] (M-F; 7a - 5p)  Please contact [facility] for night-coverage after hours (5p -7a ) and weekends on [email or web address]  Patient seen with NP, agree with the above note.   Creatinine mildly lower at 1.28.  Weight down 2 lbs.  Was unable to get right thoracentesis today, not enough fluid.   General: NAD Neck: JVP 14 cm, no thyromegaly or thyroid nodule.  Lungs: Decreased BS at bases.  CV: Lateral PMI.  Heart regular S1/S2, no S3/S4, no murmur.  1+ ankle edema.   Abdomen: Soft, nontender, no hepatosplenomegaly, no distention.  Skin: Intact without lesions or rashes.  Neurologic: Alert and oriented x 3.  Psych: Normal affect. Extremities: No clubbing or cyanosis.  HEENT: Normal.   Acute on chronic diastolic CHF in setting of cutting back diuretic after recent admission. Echo this admission with EF 65-70%, mild LVH, normal RV, mild MR, moderate AS with mean gradient 16 mmHg.  CTA chest negative for PE, showed mod-large bilateral effusions. Creatinine stable 1.28. Still volume overloaded, needs at least 1 more day of diuresis.  - Lasix 80 mg IV bid. Will give dose of metolazone 2.5 mg x 1 with pm Lasix.  -  Continue spironolactone 12.5 daily, would not increase with prior history of hyperkalemia on spironolactone.  - Had workup for cardiac amyloidosis in the past with equivocal PYP scan, would repeat PYP scan in the future.   [name] 02/11/2023 2:38 PM

## 2023-02-11 NOTE — Progress Notes (Signed)
Physical Therapy Treatment Patient Details Name: [name] MRN: [id number] DOB: Jun 07, 1935 Today's Date: 02/11/2023   History of Present Illness Pt admitted 6/4 from home, after being discharged from SNF 3 days prior, with hypoxia and edema in abdomen and LEs secondary to CHF exacerbation. 6/6 thoracentesis. Pt with hx of: lumbar compression fractures, R fib head fx treated conservatively, HOH, CHF, colon CA, breast CA, CAD, COPD, CVA, afib.    PT Comments    Pt tolerated treatment well today. Co treat with OT. Today pt was able to ambulate short distance in room with RW +2 Min A. Pt continues to be very anxious with mobility. No change in DC/DME recs at this time. PT will continue to follow.   Recommendations for follow up therapy are one component of a multi-disciplinary discharge planning process, led by the attending physician.  Recommendations [place] be updated based on patient status, additional functional criteria and insurance authorization.  Follow Up Recommendations  Can patient physically be transported by private vehicle: No    Assistance Recommended at Discharge Intermittent Supervision/Assistance  Patient can return home with the following Two people to help with walking and/or transfers;A lot of help with bathing/dressing/bathroom;Assist for transportation;Help with stairs or ramp for entrance;Assistance with cooking/housework   Equipment Recommendations  None recommended by PT    Recommendations for Other Services       Precautions / Restrictions Precautions Precautions: Fall Precaution Comments: watch O2 Restrictions Weight Bearing Restrictions: No     Mobility  Bed Mobility Overal bed mobility: Needs Assistance Bed Mobility: Sit to Supine       Sit to supine: Mod assist   General bed mobility comments: assist for LEs back into bed    Transfers Overall transfer level: Needs assistance Equipment used: Rolling walker (2 wheels) Transfers: Sit to/from  Stand Sit to Stand: Mod assist, +2 safety/equipment           General transfer comment: assist to rise and steady, increased time    Ambulation/Gait Ambulation/Gait assistance: +2 physical assistance, Min assist Gait Distance (Feet): 15 Feet Assistive device: Rolling walker (2 wheels) Gait Pattern/deviations: Decreased stride length, Step-through pattern, Trunk flexed, Knee flexed in stance - right, Knee flexed in stance - left Gait velocity: decreased     General Gait Details: Pt very anxious while ambulating but overall steady.   Stairs             Wheelchair Mobility    Modified Rankin (Stroke Patients Only)       Balance Overall balance assessment: Needs assistance   Sitting balance-Leahy Scale: Fair     Standing balance support: Bilateral upper extremity supported Standing balance-Leahy Scale: Poor                              Cognition Arousal/Alertness: Awake/alert Behavior During Therapy: Anxious Overall Cognitive Status: Impaired/Different from baseline Area of Impairment: Safety/judgement                         Safety/Judgement: Decreased awareness of safety     General Comments: attempts to sit prior to being aligned with sitting surface when fatigued        Exercises      General Comments General comments (skin integrity, edema, etc.): Pt received on 5L at 98%. Pt dropped to 3L and remained above 91%.      Pertinent Vitals/Pain Pain Assessment Pain Assessment: Faces Faces  Pain Scale: Hurts little more Pain Location: buttocks Pain Descriptors / Indicators: Sore Pain Intervention(s): Repositioned    Home Living                          Prior Function            PT Goals (current goals can now be found in the care plan section) Progress towards PT goals: Progressing toward goals    Frequency    Min 1X/week      PT Plan Current plan remains appropriate    Co-evaluation PT/OT/SLP  Co-Evaluation/Treatment: Yes Reason for Co-Treatment: For patient/therapist safety PT goals addressed during session: Mobility/safety with mobility;Proper use of DME OT goals addressed during session: ADL's and self-care      AM-PAC PT "6 Clicks" Mobility   Outcome Measure  Help needed turning from your back to your side while in a flat bed without using bedrails?: A Little Help needed moving from lying on your back to sitting on the side of a flat bed without using bedrails?: A Little Help needed moving to and from a bed to a chair (including a wheelchair)?: A Lot Help needed standing up from a chair using your arms (e.g., wheelchair or bedside chair)?: A Lot Help needed to walk in hospital room?: A Lot Help needed climbing 3-5 steps with a railing? : Total 6 Click Score: 13    End of Session Equipment Utilized During Treatment: Gait belt;Oxygen Activity Tolerance: Patient tolerated treatment well Patient left: in chair;with call bell/phone within reach;with chair alarm set Nurse Communication: Mobility status PT Visit Diagnosis: Unsteadiness on feet (R26.81);Muscle weakness (generalized) (M62.81);Difficulty in walking, not elsewhere classified (R26.2);Pain     Time: 1610-9604 PT Time Calculation (min) (ACUTE ONLY): 33 min  Charges:  $Gait Training: 8-22 mins                     [name], PT, DPT Acute Rehab Services [phone]    [name] 02/11/2023, 4:01 PM

## 2023-02-11 NOTE — Progress Notes (Signed)
Initial Nutrition Assessment  DOCUMENTATION CODES:   Not applicable  INTERVENTION:  Diet adjusted to dysphagia 2 for ease of chewing and swallowing Consider SLP evaluation d/t ongoing c/o difficulty swallowing solids; discussed with MD Ensure Enlive po BID, each supplement provides 350 kcal and 20 grams of protein. Magic cup TID with meals, each supplement provides 290 kcal and 9 grams of protein Recommend bowel regimen given no BM for at least 5 days  NUTRITION DIAGNOSIS:   Inadequate oral intake related to altered GI function (LES dysmotility) as evidenced by per patient/family report.  GOAL:   Patient will meet greater than or equal to 90% of their needs  MONITOR:   PO intake, Supplement acceptance, Labs, Weight trends  REASON FOR ASSESSMENT:   Consult Assessment of nutrition requirement/status, Poor PO  ASSESSMENT:   Pt admitted with weight gain and swelling x1 month secondary to acute on chronic CHF. PMH significant for aortic stenosis, HFpEF, HTN, HLD, CAD, permanent afib, stroke.  Pt resting soundly. Multiple family members present at bedside to provide nutrition related history. They report that since admission in [place], pt has had increased difficulty with swallowing solids, in addition c/o taste changes. 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 They then report she was seen by SLP about 2-3 weeks ago at [facility] but deny having had any changes to diet textures. She has primarily only been able to tolerate liquids. Discussed different types of diet modifications (i.e dysphagia 1,2,3). Family does not suspect that she will tolerate a minced food diet but agree to trying this over purees for now. Discussed this with MD, pt Elie benefit from SLP evaluation during admission to assist with further diet recommendations.   Meal completions: 6/5: 50% breakfast, 100% lunch, 75% dinner 6/6: 85% breakfast, 75% lunch 6/7: 10% breakfast  Pt's family reports  that on admission to rehab, pt's weight was ~150 lbs. On discharge, her weight was 195 lbs. Pt's dry weight is unknown. Reviewed weight history. Within the last 6 months, pt's weight noted to fluctuate between 77-83 kg. Prior to last d/c pts weight was 85.6kg. Current weight is 82.3 kg.   Edema: very deep pitting BLE  Medications: lasix 80mg  BID, protonix, NaCl tablet  Labs: BUN 38, Cr 1.28, GFR 41  UOP: x24 hours + x12 hours I/O's: - since admission  NUTRITION - FOCUSED PHYSICAL EXAM: Pt resting, deferred to follow up.   Diet Order:   Diet Order             DIET DYS 2 Room service appropriate? Yes with Assist; Fluid consistency: Thin; Fluid restriction: 1800 mL Fluid  Diet effective now                   EDUCATION NEEDS:   Education needs have been addressed  Skin:  Skin Assessment: Skin Integrity Issues: Skin Integrity Issues:: Stage I, Other (Comment) Stage I: sacrum Other: skin tear L leg and L arm  Last BM:  6/2  Height:   Ht Readings from Last 1 Encounters:  02/08/23 5\' 5"  (1.651 m)    Weight:   Wt Readings from Last 1 Encounters:  02/10/23 82.3 kg    Ideal Body Weight:  56.8 kg  BMI:  Body mass index is 30.2 kg/m.  Estimated Nutritional Needs:   Kcal:  1400-1600  Protein:  75-90g  Fluid:  1.4-1.6L  [name], RDN, LDN Clinical Nutrition

## 2023-02-11 NOTE — Care Management Important Message (Signed)
Important Message  Patient Details  Name: [name] MRN: [id number] Date of Birth: [birth year]   Medicare Important Message Given:  Yes     Renie Ora 02/11/2023, 8:58 AM

## 2023-02-11 NOTE — Plan of Care (Signed)
  Problem: Education: Goal: Knowledge of General Education information will improve Description: Including pain rating scale, medication(s)/side effects and non-pharmacologic comfort measures Outcome: Progressing   Problem: Health Behavior/Discharge Planning: Goal: Ability to manage health-related needs will improve Outcome: Progressing   Problem: Clinical Measurements: Goal: Ability to maintain clinical measurements within normal limits will improve Outcome: Progressing Goal: Will remain free from infection Outcome: Progressing Goal: Diagnostic test results will improve Outcome: Progressing Goal: Cardiovascular complication will be avoided Outcome: Progressing   Problem: Nutrition: Goal: Adequate nutrition will be maintained Outcome: Progressing   Problem: Coping: Goal: Level of anxiety will decrease Outcome: Progressing   Problem: Elimination: Goal: Will not experience complications related to bowel motility Outcome: Progressing Goal: Will not experience complications related to urinary retention Outcome: Progressing   Problem: Pain Managment: Goal: General experience of comfort will improve Outcome: Progressing   Problem: Safety: Goal: Ability to remain free from injury will improve Outcome: Progressing

## 2023-02-12 DIAGNOSIS — Z515 Encounter for palliative care: Secondary | ICD-10-CM

## 2023-02-12 DIAGNOSIS — I5033 Acute on chronic diastolic (congestive) heart failure: Secondary | ICD-10-CM | POA: Diagnosis not present

## 2023-02-12 DIAGNOSIS — J9601 Acute respiratory failure with hypoxia: Secondary | ICD-10-CM | POA: Diagnosis not present

## 2023-02-12 DIAGNOSIS — Z7189 Other specified counseling: Secondary | ICD-10-CM

## 2023-02-12 DIAGNOSIS — S32050A Wedge compression fracture of fifth lumbar vertebra, initial encounter for closed fracture: Secondary | ICD-10-CM | POA: Diagnosis not present

## 2023-02-12 LAB — BASIC METABOLIC PANEL
Anion gap: 12 (ref 5–15)
BUN: 52 mg/dL — ABNORMAL HIGH (ref 8–23)
CO2: 31 mmol/L (ref 22–32)
Calcium: 9.2 mg/dL (ref 8.9–10.3)
Chloride: 93 mmol/L — ABNORMAL LOW (ref 98–111)
Creatinine, Ser: 1.69 mg/dL — ABNORMAL HIGH (ref 0.44–1.00)
GFR, Estimated: 29 mL/min — ABNORMAL LOW (ref >60)
Glucose, Bld: 142 mg/dL — ABNORMAL HIGH (ref 70–99)
Potassium: 5 mmol/L (ref 3.5–5.1)
Sodium: 136 mmol/L (ref 135–145)

## 2023-02-12 LAB — CBC
HCT: 28.3 % — ABNORMAL LOW (ref 36.0–46.0)
Hemoglobin: 8.8 g/dL — ABNORMAL LOW (ref 12.0–15.0)
MCH: 30.1 pg (ref 26.0–34.0)
MCHC: 31.1 g/dL (ref 30.0–36.0)
MCV: 96.9 fL (ref 80.0–100.0)
Platelets: 172 10*3/uL (ref 150–400)
RBC: 2.92 MIL/uL — ABNORMAL LOW (ref 3.87–5.11)
RDW: 15.7 % — ABNORMAL HIGH (ref 11.5–15.5)
WBC: 6.9 10*3/uL (ref 4.0–10.5)
nRBC: 0 % (ref 0.0–0.2)

## 2023-02-12 LAB — CULTURE, BODY FLUID W GRAM STAIN -BOTTLE

## 2023-02-12 MED ORDER — BISACODYL 10 MG RE SUPP
10.0000 mg | Freq: Once | RECTAL | Status: AC
  Administered 2023-02-12: 10 mg via RECTAL
  Filled 2023-02-12: qty 1

## 2023-02-12 MED ORDER — BISACODYL 10 MG RE SUPP: 10.0000 mg | Freq: Once | RECTAL | Status: DC

## 2023-02-12 MED ORDER — CLONAZEPAM 0.5 MG PO TABS
0.5000 mg | ORAL_TABLET | Freq: Two times a day (BID) | ORAL | Status: DC | PRN
  Administered 2023-02-12 – 2023-02-14 (×3): 0.5 mg via ORAL
  Filled 2023-02-12 (×3): qty 1

## 2023-02-12 MED ORDER — IPRATROPIUM-ALBUTEROL 0.5-2.5 (3) MG/3ML IN SOLN
3.0000 mL | Freq: Two times a day (BID) | RESPIRATORY_TRACT | Status: DC
  Administered 2023-02-13 – 2023-02-16 (×7): 3 mL via RESPIRATORY_TRACT
  Filled 2023-02-12 (×6): qty 3

## 2023-02-12 NOTE — Plan of Care (Signed)
  Problem: Clinical Measurements: Goal: Diagnostic test results will improve Outcome: Progressing   Problem: Education: Goal: Knowledge of General Education information will improve Description: Including pain rating scale, medication(s)/side effects and non-pharmacologic comfort measures Outcome: Progressing   Problem: Health Behavior/Discharge Planning: Goal: Ability to manage health-related needs will improve Outcome: Progressing   Problem: Clinical Measurements: Goal: Ability to maintain clinical measurements within normal limits will improve Outcome: Progressing Goal: Will remain free from infection Outcome: Progressing Goal: Diagnostic test results will improve Outcome: Progressing Goal: Respiratory complications will improve Outcome: Progressing Goal: Cardiovascular complication will be avoided Outcome: Progressing   Problem: Activity: Goal: Risk for activity intolerance will decrease Outcome: Progressing   Problem: Nutrition: Goal: Adequate nutrition will be maintained Outcome: Progressing   Problem: Coping: Goal: Level of anxiety will decrease Outcome: Progressing   Problem: Pain Managment: Goal: General experience of comfort will improve Outcome: Progressing   Problem: Safety: Goal: Ability to remain free from injury will improve Outcome: Progressing

## 2023-02-12 NOTE — Progress Notes (Addendum)
PROGRESS NOTE    [name]  UJW:[id number] DOB: [birth year] DOA: 02/07/2023 PCP: Donita Brooks, MD   88/F w/aortic stenosis, hypertension, CAD, atrial fibrillation, pulmonary hypertension who presented with dyspnea. Reported four weeks of worsening dyspnea, orthopnea and 40 lbs weight gain.  -Recent hospitalization 5/4-5/13 for closed compression fracture L5 and closed right fibular fracture.  -admitted 6/3 w/ respiratory distress and hypoxia. -CXR w/cardiomegaly with bilateral hilar vascular congestion, bilateral increased lung markings and bilateral pleural effusions.     Subjective: -Feels better overall today,  Assessment and Plan:  Acute on chronic diastolic heart failure (HCC) Severe pulmonary hypertension Pleural effusion Echo with EF 65 to 70%, mild LVH, RV preserved, severely elevated PA systolic pressures, severe TR, moderate aortic stenosis.  -CTA chest with moderate to large bilateral pleural effusions, albumin is 3.1 -Improving with diuresis, 4.8 L negative, left Thora-600 mL drained 6/6, not enough fluid on right -Bump in creatinine, holding diuretics today -Resume apixaban -Continue Aldactone, amlodipine, clonidine -Appreciate CHF team input, palliative care was suggested -PT eval completed, home health recommended  AKI on CKD 3a -Creatinine has bumped to 1.6 today, hold diuretics, also Street need Foley, ARB on hold   Persistent atrial fibrillation (HCC) Rate controlled.  In A-fib at this time -Resumed apixaban   Coronary artery disease -stable   Essential hypertension -Improving, meds as above   Anemia of chronic disease -Hemoglobin trending lower today, check anemia panel in a.m.   COPD (chronic obstructive pulmonary disease) (HCC) -continue bronchodilator therapy.    GERD (gastroesophageal reflux disease) Continue antiacid therapy.   Closed compression fracture of L5 lumbar vertebra, initial encounter (HCC) -pain control, PT and Ot/.   Lung  nodules -needs FU  Thyroid nodules -needs FU   Pressure injury of skin Continue local care  DVT prophylaxis: Apixaban Code Status: DNR Family Communication: Niece at bedside Disposition Plan: Home with home health services  Consultants:    Procedures:   Antimicrobials:    Objective: Vitals:   02/12/23 0601 02/12/23 0602 02/12/23 0842 02/12/23 0920  BP:  (!) 134/43  (!) 131/47  Pulse: 72 64 66 72  Resp: 20 (!) 28 20 20   Temp: 98.1 F (36.7 C)   98.1 F (36.7 C)  TempSrc: Oral   Oral  SpO2:  99%    Weight: 79.6 kg     Height:        Intake/Output Summary (Last 24 hours) at 02/12/2023 1112 Last data filed at 02/11/2023 1700 Gross per 24 hour  Intake 440 ml  Output 375 ml  Net 65 ml   Filed Weights   02/09/23 0457 02/10/23 0349 02/12/23 0601  Weight: 83.1 kg 82.3 kg 79.6 kg    Examination:  General exam: Frail elderly female sitting up in bed, AAOx3 HEENT: Positive JVD CVS: S1-S2, regular rhythm, systolic murmur Lungs: Improved air movement, rare basilar Rales Abdomen: Soft, nontender, bowel sounds present Extremities: 1+ edema  Skin: No rashes Psychiatry:  Mood & affect appropriate.     Data Reviewed:   CBC: Recent Labs  Lab 02/07/23 1641 02/07/23 1726 02/10/23 0127 02/11/23 0046 02/12/23 0046  WBC 6.8  --  7.2 9.4 6.9  NEUTROABS 3.8  --   --   --   --   HGB 10.6* 11.6* 9.7* 10.7* 8.8*  HCT 35.0* 34.0* 31.7* 33.7* 28.3*  MCV 99.2  --  97.5 95.2 96.9  PLT 223  --  191 242 172   Basic Metabolic Panel: Recent Labs  Lab  02/07/23 1641 02/07/23 1726 02/08/23 1729 02/09/23 0106 02/10/23 0127 02/11/23 0046 02/12/23 0711  NA 131*   < > 132* 135 134* 136 136  K 4.1   < > 4.7 4.6 4.5 4.1 5.0  CL 94*  --  93* 96* 94* 94* 93*  CO2 31  --  31 33* 31 31 31   GLUCOSE 134*  --  144* 112* 122* 148* 142*  BUN 22  --  29* 32* 39* 38* 52*  CREATININE 1.17*  --  1.36* 1.38* 1.34* 1.28* 1.69*  CALCIUM 8.8*  --  9.0 8.9 9.0 9.6 9.2  MG 1.9  --   --   2.1  --   --   --    < > = values in this interval not displayed.   GFR: Estimated Creatinine Clearance: 24.9 mL/min (A) (by C-G formula based on SCr of 1.69 mg/dL (H)). Liver Function Tests: Recent Labs  Lab 02/07/23 1641 02/10/23 1556  AST 35 19  ALT 23 17  ALKPHOS 84 74  BILITOT 0.5 0.6  PROT 6.1* 5.7*  ALBUMIN 3.1* 2.8*   No results for input(s): "LIPASE", "AMYLASE" in the last 168 hours. No results for input(s): "AMMONIA" in the last 168 hours. Coagulation Profile: Recent Labs  Lab 02/08/23 1421  INR 1.0   Cardiac Enzymes: No results for input(s): "CKTOTAL", "CKMB", "CKMBINDEX", "TROPONINI" in the last 168 hours. BNP (last 3 results) No results for input(s): "PROBNP" in the last 8760 hours. HbA1C: No results for input(s): "HGBA1C" in the last 72 hours. CBG: No results for input(s): "GLUCAP" in the last 168 hours. Lipid Profile: No results for input(s): "CHOL", "HDL", "LDLCALC", "TRIG", "CHOLHDL", "LDLDIRECT" in the last 72 hours. Thyroid Function Tests: No results for input(s): "TSH", "T4TOTAL", "FREET4", "T3FREE", "THYROIDAB" in the last 72 hours. Anemia Panel: No results for input(s): "VITAMINB12", "FOLATE", "FERRITIN", "TIBC", "IRON", "RETICCTPCT" in the last 72 hours. Urine analysis:    Component Value Date/Time   COLORURINE YELLOW 01/09/2023 0948   APPEARANCEUR CLEAR 01/09/2023 0948   LABSPEC 1.012 01/09/2023 0948   PHURINE 5.0 01/09/2023 0948   GLUCOSEU NEGATIVE 01/09/2023 0948   HGBUR SMALL (A) 01/09/2023 0948   BILIRUBINUR NEGATIVE 01/09/2023 0948   KETONESUR NEGATIVE 01/09/2023 0948   PROTEINUR NEGATIVE 01/09/2023 0948   UROBILINOGEN 0.2 11/04/2013 1321   NITRITE NEGATIVE 01/09/2023 0948   LEUKOCYTESUR NEGATIVE 01/09/2023 0948   Sepsis Labs: @LABRCNTIP (procalcitonin:4,lacticidven:4)  ) Recent Results (from the past 240 hour(s))  Culture, body fluid w Gram Stain-bottle     Status: None (Preliminary result)   Collection Time: 02/10/23 12:04 PM    Specimen: Pleura  Result Value Ref Range Status   Specimen Description PLEURAL  Final   Special Requests NONE  Final   Culture   Final    NO GROWTH 2 DAYS Performed at [facility], [address] [place], [place] [address]    Report Status PENDING  Incomplete  Gram stain     Status: None   Collection Time: 02/10/23 12:04 PM   Specimen: Pleura  Result Value Ref Range Status   Specimen Description PLEURAL  Final   Special Requests NONE  Final   Gram Stain   Final    MODERATE WBC PRESENT, PREDOMINANTLY MONONUCLEAR RARE SQUAMOUS EPITHELIAL CELLS PRESENT NO ORGANISMS SEEN Performed at [facility], [address] [place], [place] [address]    Report Status 02/10/2023 FINAL  Final     Radiology Studies: IR ABDOMEN US LIMITED  Result  Date: 02/11/2023 CLINICAL DATA:  88 year old female referred for possible thoracentesis EXAM: LIMITED CHEST ULTRASOUND FOR PLEURAL FLUID TECHNIQUE: Limited ultrasound survey for pleural effusion was formed. COMPARISON:  CT 02/08/2023 FINDINGS: Ultrasound of the right chest demonstrates small volume pleural effusion without safe window for access. IMPRESSION: Small volume right pleural fluid. Electronically Signed   By: Gilmer Mor D.O.   On: 02/11/2023 09:32   IR THORACENTESIS ASP PLEURAL SPACE W/IMG GUIDE  Result Date: 02/10/2023 INDICATION: Dyspnea with fluid overload and bilateral pleural effusions. Request for diagnostic and therapeutic thoracentesis. EXAM: ULTRASOUND GUIDED LEFT THORACENTESIS MEDICATIONS: 1% lidocaine 10 mL COMPLICATIONS: None immediate. PROCEDURE: An ultrasound guided thoracentesis was thoroughly discussed with the patient and questions answered. The benefits, risks, alternatives and complications were also discussed. The patient understands and wishes to proceed with the procedure. Written consent was obtained. Ultrasound was performed to localize and mark an adequate pocket of fluid in the left chest. The area was then  prepped and draped in the normal sterile fashion. 1% Lidocaine was used for local anesthesia. Under ultrasound guidance a 6 Fr Safe-T-Centesis catheter was introduced. Thoracentesis was performed. The catheter was removed and a dressing applied. FINDINGS: A total of approximately 600 mL of clear yellow fluid was removed. Samples were sent to the laboratory as requested by the clinical team. IMPRESSION: Successful ultrasound guided left thoracentesis yielding 600 mL of pleural fluid. No pneumothorax on post-procedure chest x-ray. Procedure performed by: Corrin Parker, PA-C Electronically Signed   By: Marliss Coots M.D.   On: 02/10/2023 14:01   DG Chest 1 View  Result Date: 02/10/2023 CLINICAL DATA:  Shortness of breath.  Status post thoracentesis EXAM: CHEST  1 VIEW COMPARISON:  02/07/2023 FINDINGS: Stable cardiomediastinal contours. Bilateral pleural effusions are again noted. Compared with the previous exam the left pleural effusion appears decreased in the interval. No significant pneumothorax status post thoracentesis. Mild diffuse increase interstitial markings. Decreased aeration to both lung bases compatible with atelectasis. IMPRESSION: 1. Decrease in left pleural effusion status post thoracentesis. No pneumothorax. 2. Persistent bilateral pleural effusions and increased interstitial markings compatible with pulmonary edema. Electronically Signed   By: Signa Kell M.D.   On: 02/10/2023 12:21     Scheduled Meds:  amLODipine  10 mg Oral Daily   apixaban  5 mg Oral BID   Chlorhexidine Gluconate Cloth  6 each Topical Daily   cloNIDine  0.2 mg Oral BID   feeding supplement  237 mL Oral BID BM   guaiFENesin  600 mg Oral BID   hydrALAZINE  25 mg Oral TID   ipratropium-albuterol  3 mL Nebulization BID   pantoprazole  40 mg Oral Daily   polyethylene glycol  17 g Oral Daily   sodium chloride  2 g Oral BID WC   Continuous Infusions:   LOS: 4 days    Time spent:    Zannie Cove,  MD [facility]   02/12/2023, 11:12 AM

## 2023-02-12 NOTE — Consult Note (Signed)
Palliative Care Consult Note                                  Date: 02/12/2023   Patient Name: [name]  DOB: 1935/04/06  MRN: [id number]  Age / Sex: 88 y.o., female  PCP: Donita Brooks, MD Referring Physician: Zannie Cove, MD  Reason for Consultation: Establishing goals of care  HPI/Patient Profile: 88 y.o. female  with past medical history of HFpEF, severe pulmonary hypertension, aortic stenosis, CAD, atrial fibrillation, chronic hyponatremia, CKD stage III, and anemia of chronic disease.  She was recently admitted to [facility] hospital 01/08/2023 through 01/17/2023 after a fall and was found to have an L5 compression fracture.  She was discharged to [facility] for rehab.  She presented to the ED 02/07/2023 with dyspnea and was admitted with acute on chronic heart failure and pleural effusion.   Palliative Medicine was consulted for goals of care.   Subjective:   I have reviewed medical records including progress notes, labs and imaging, and received and update from the RN.   I met with pateint and her son/Clarence at bedside to discuss diagnosis, prognosis, GOC, disposition, and options. Patient is currently OOB to the recliner and able to participate in GOC discussion. She reports her breathing is much improved from admission.   I introduced Palliative Medicine as specialized medical care for people living with serious illness. It focuses on providing relief from the symptoms and stress of a serious illness.   We discussed patient's current illness and what it means in the larger context of his/her ongoing co-morbidities. Current clinical status was reviewed. Natural disease trajectory of *** was discussed.  Created space and opportunity for patient and family to explore thoughts and feelings regarding current medical situation.  Values and goals of care important to patient and family were attempted to be elicited.  A discussion was  had today regarding advanced directives. Concepts specific to code status, artifical feeding and hydration, continued IV antibiotics and rehospitalization was had.  The MOST form was introduced and discussed.  Questions and concerns addressed. Patient/family encouraged to call with questions or concerns.     Life Review: ***  Functional Status: ***  Patient/Family Understanding of Illness: ***  Patient Values: ***  Goals: ***  Additional Discussion: ***  Review of Systems  Objective:   Primary Diagnoses: Present on Admission:  Closed compression fracture of L5 lumbar vertebra, initial encounter (HCC)  Persistent atrial fibrillation (HCC)  Essential hypertension  GERD (gastroesophageal reflux disease)  COPD (chronic obstructive pulmonary disease) (HCC)  Coronary artery disease  Anemia of chronic disease   Physical Exam  Vital Signs:  BP (!) 131/47 (BP Location: Right Arm)   Pulse 72   Temp 98.1 F (36.7 C) (Oral)   Resp 20   Ht 5\' 5"  (1.651 m)   Wt 79.6 kg   SpO2 99%   BMI 29.19 kg/m   Palliative Assessment/Data: ***     Assessment & Plan:   SUMMARY OF RECOMMENDATIONS   ***  Primary Decision Maker: {Primary Decision WUJWJ:19147}  Code Status/Advance Care Planning: {Palliative Code status:23503}  Symptom Management:  ***  Prognosis:  {Palliative Care Prognosis:23504}  Discharge Planning:  {Palliative dispostion:23505}   Discussed with: ***    Thank you for allowing [place] to participate in the care of Cindel M Mitchner   Time Total: ***  Greater than 50%  of this time  was spent counseling and coordinating care related to the above assessment and plan.  Signed by: Sherlean Foot, NP Palliative Medicine Team  Team Phone # [phone]  For individual providers, please see AMION

## 2023-02-12 NOTE — Progress Notes (Signed)
Patient ID: [name], female   DOB: [birth year], 88 y.o.   MRN: [id number]     Advanced Heart Failure Rounding Note  PCP-Cardiologist: Armanda Magic, MD   Subjective:    Weight down another 6 lbs, breathing is better.   S/p L thoracentesis, not enough fluid on right for thoracentesis.   Breathing has much improved today with decreased edema.  Creatinine up to 1.69.    +Urinary retention.  Objective:   Weight Range: 79.6 kg Body mass index is 29.19 kg/m.   Vital Signs:   Temp:  [97.9 F (36.6 C)-99.2 F (37.3 C)] 98.1 F (36.7 C) (06/08 0601) Pulse Rate:  [56-84] 72 (06/08 0920) Resp:  [18-28] 20 (06/08 0920) BP: (112-165)/(39-56) 131/47 (06/08 0920) SpO2:  [91 %-99 %] 99 % (06/08 0602) Weight:  [79.6 kg] 79.6 kg (06/08 0601) Last BM Date : 02/06/23  Weight change: Filed Weights   02/09/23 0457 02/10/23 0349 02/12/23 0601  Weight: 83.1 kg 82.3 kg 79.6 kg    Intake/Output:   Intake/Output Summary (Last 24 hours) at 02/12/2023 0950 Last data filed at 02/11/2023 1700 Gross per 24 hour  Intake 440 ml  Output 375 ml  Net 65 ml   Physical Exam    General: NAD Neck: JVP 8-9 cm, no thyromegaly or thyroid nodule.  Lungs: Clear to auscultation bilaterally with normal respiratory effort. CV: Nondisplaced PMI.  Heart irregular S1/S2, no S3/S4, 2/6 SEM RUSB.  Trace ankle edema.  Abdomen: Soft, nontender, no hepatosplenomegaly, no distention.  Skin: Intact without lesions or rashes.  Neurologic: Alert and oriented x 3.  Psych: Normal affect. Extremities: No clubbing or cyanosis.  HEENT: Normal.    Telemetry   A fib 70s (Personally reviewed)    EKG    No new EKG today  Labs    CBC Recent Labs    02/11/23 0046 02/12/23 0046  WBC 9.4 6.9  HGB 10.7* 8.8*  HCT 33.7* 28.3*  MCV 95.2 96.9  PLT 242 172   Basic Metabolic Panel Recent Labs    62/13/08 0046 02/12/23 0711  NA 136 136  K 4.1 5.0  CL 94* 93*  CO2 31 31  GLUCOSE 148* 142*  BUN 38* 52*   CREATININE 1.28* 1.69*  CALCIUM 9.6 9.2   Liver Function Tests Recent Labs    02/10/23 1556  AST 19  ALT 17  ALKPHOS 74  BILITOT 0.6  PROT 5.7*  ALBUMIN 2.8*   No results for input(s): "LIPASE", "AMYLASE" in the last 72 hours. Cardiac Enzymes No results for input(s): "CKTOTAL", "CKMB", "CKMBINDEX", "TROPONINI" in the last 72 hours.  BNP: BNP (last 3 results) Recent Labs    11/18/22 0635 12/14/22 1246 02/07/23 1641  BNP 136.1* 117.9* 473.9*    ProBNP (last 3 results) No results for input(s): "PROBNP" in the last 8760 hours.   D-Dimer No results for input(s): "DDIMER" in the last 72 hours. Hemoglobin A1C No results for input(s): "HGBA1C" in the last 72 hours. Fasting Lipid Panel No results for input(s): "CHOL", "HDL", "LDLCALC", "TRIG", "CHOLHDL", "LDLDIRECT" in the last 72 hours. Thyroid Function Tests No results for input(s): "TSH", "T4TOTAL", "T3FREE", "THYROIDAB" in the last 72 hours.  Invalid input(s): "FREET3"  Other results:  Imaging  No results found.  Medications:     Scheduled Medications:  amLODipine  10 mg Oral Daily   apixaban  5 mg Oral BID   Chlorhexidine Gluconate Cloth  6 each Topical Daily   cloNIDine  0.2 mg Oral BID  feeding supplement  237 mL Oral BID BM   guaiFENesin  600 mg Oral BID   hydrALAZINE  25 mg Oral TID   ipratropium-albuterol  3 mL Nebulization TID   pantoprazole  40 mg Oral Daily   polyethylene glycol  17 g Oral Daily   sodium chloride  2 g Oral BID WC    Infusions:   PRN Medications: acetaminophen **OR** acetaminophen, famotidine, hydrALAZINE, ipratropium-albuterol, ondansetron **OR** ondansetron (ZOFRAN) IV, ondansetron, mouth rinse, oxyCODONE, ranolazine, sodium chloride   Patient Profile  88 y.o. with history of poorly controlled HTN, chronic atrial fibrillation, chronic diastolic CHF, and prior episode of Takotsubo cardiomyopathy. AHF team asked to see for acute on chronic systolic heart failure.    Assessment/Plan   1. Acute on chronic diastolic CHF: NYHA class III symptoms, slowly progressive over time.  Mild volume overload on exam.  RHC/LHC in 4/22 showed nonobstructive CAD; mean RA 5, PA 44/10, mean PCWP 20, CI 4.89, PVR < 1 WU (pulmonary venous hypertension).  Acute on chronic diastolic CHF in setting of cutting back diuretic after recent admission. Echo this admission with EF 65-70%, mild LVH, normal RV, mild MR, moderate AS with mean gradient 16 mmHg.  CTA chest negative for PE, showed mod-large bilateral effusions.  She has diuresed well and weight is down.  Much less LE edema though JVP still elevated.  Creatinine up to 1.69.  - Stop spironolactone (has not tolerated in past due to hyperkalemia.  - Hold Lasix today with improved volume and rise in creatinine, start torsemide 40 mg daily tomorrow if creatinine stabilizes.  - Unable to tolerate Jardiance due to frequent UTIs.  - she has declined Cardiomems - Had workup for cardiac amyloidosis in the past with equivocal PYP scan, would repeat PYP scan in the future.  2.  CAD: Nonobstructive on 4/22 cath. She has been intolerant of statins and Zetia.   She has had atypical chest pain in the past. Admission 7/23 with CP, elevated HsTnI, CT ruled out acute aortic syndrome, echo ok. Pain felt to be related to coronary microvascular dysfunction.   - No ASA given apixaban use.  - Intolerant of statins and Zetia 3. Chronic atrial fibrillation/bradycardia: She is off nodal blockade. - Continue Eliquis 5 mg bid 4. HTN: BP controlled.  - Off spironolactone and losartan with hyperkalemia - Continue amlodipine 10 mg daily - Continue Clonidine 0.2 mg BID - Continue hydralazine 25 mg TID 5. Aortic stenosis: Mild-moderate on last echo.  - mod on echo 6/4  6. Bilateral pleural effusions: CT chest 02/08/23 showed bilateral moderate to large pleural effusions with moderate to marked severity bilateral lower lobe compressive atelectasis.  S/p left  thoracentesis (transudate),  not enough fluid for right thoracentesis.  7. Physical Deconditioning: Recent fall with closed compression fracture of L5 vertebra. - Continue working with PT/OT 8. Hx of Hyperkalemia: K 5 today.  - Spironolactone stopped.  9. AKI on CKD stage 3: No IV Lasix today. Stop spironolactone.   Length of Stay: 4  Marca Ancona, MD  02/12/2023, 9:50 AM  Advanced Heart Failure Team Pager [phone] (M-F; 7a - 5p)  Please contact [facility] for night-coverage after hours (5p -7a ) and weekends on [email or web address]

## 2023-02-12 NOTE — Progress Notes (Signed)
Physical Therapy Treatment Patient Details Name: [name] MRN: [id number] DOB: 15-Dec-1934 Today's Date: 02/12/2023   History of Present Illness Pt admitted 6/4 from home, after being discharged from SNF 3 days prior, with hypoxia and edema in abdomen and LEs secondary to CHF exacerbation. 6/6 thoracentesis. Pt with hx of: lumbar compression fractures, R fib head fx treated conservatively, HOH, CHF, colon CA, breast CA, CAD, COPD, CVA, afib.    PT Comments    Pt tolerated treatment well today. Pt was able to transfer to [name] with +2 Mod A RW. Pt continues to be very anxious about mobility and requires constant reassurance. No change in DC/DME recs at this time. Pt will continue to follow.   Recommendations for follow up therapy are one component of a multi-disciplinary discharge planning process, led by the attending physician.  Recommendations [name] be updated based on patient status, additional functional criteria and insurance authorization.  Follow Up Recommendations  Can patient physically be transported by private vehicle: No    Assistance Recommended at Discharge Intermittent Supervision/Assistance  Patient can return home with the following Two people to help with walking and/or transfers;A lot of help with bathing/dressing/bathroom;Assist for transportation;Help with stairs or ramp for entrance;Assistance with cooking/housework   Equipment Recommendations  None recommended by PT    Recommendations for Other Services       Precautions / Restrictions Precautions Precautions: Fall Precaution Comments: watch O2 Restrictions Weight Bearing Restrictions: No     Mobility  Bed Mobility               General bed mobility comments: up in chair    Transfers Overall transfer level: Needs assistance Equipment used: Rolling walker (2 wheels) Transfers: Sit to/from Stand Sit to Stand: Mod assist, +2 safety/equipment   Step pivot transfers: +2 physical assistance, Mod  assist       General transfer comment: assist to rise and steady, increased time. pt continues to be very anxious about mobility.    Ambulation/Gait                   Stairs             Wheelchair Mobility    Modified Rankin (Stroke Patients Only)       Balance Overall balance assessment: Needs assistance Sitting-balance support: Feet supported, Bilateral upper extremity supported Sitting balance-Leahy Scale: Fair                                      Cognition Arousal/Alertness: Awake/alert Behavior During Therapy: Anxious Overall Cognitive Status: Impaired/Different from baseline Area of Impairment: Safety/judgement                         Safety/Judgement: Decreased awareness of safety              Exercises      General Comments General comments (skin integrity, edema, etc.): VSS on 2L      Pertinent Vitals/Pain Pain Assessment Pain Assessment: No/denies pain    Home Living                          Prior Function            PT Goals (current goals can now be found in the care plan section) Progress towards PT goals: Progressing toward goals  Frequency    Min 1X/week      PT Plan Current plan remains appropriate    Co-evaluation              AM-PAC PT "6 Clicks" Mobility   Outcome Measure  Help needed turning from your back to your side while in a flat bed without using bedrails?: A Little Help needed moving from lying on your back to sitting on the side of a flat bed without using bedrails?: A Little Help needed moving to and from a bed to a chair (including a wheelchair)?: A Lot Help needed standing up from a chair using your arms (e.g., wheelchair or bedside chair)?: A Lot Help needed to walk in hospital room?: A Lot Help needed climbing 3-5 steps with a railing? : Total 6 Click Score: 13    End of Session Equipment Utilized During Treatment: Gait belt;Oxygen Activity  Tolerance: Patient tolerated treatment well Patient left: Other (comment);with nursing/sitter in room (On [facility] with nursing staff) Nurse Communication: Mobility status PT Visit Diagnosis: Unsteadiness on feet (R26.81);Muscle weakness (generalized) (M62.81);Difficulty in walking, not elsewhere classified (R26.2);Pain     Time: 1610-9604 PT Time Calculation (min) (ACUTE ONLY): 14 min  Charges:  $Therapeutic Activity: 8-22 mins                     [name], PT, DPT Acute Rehab Services [phone]    [name] 02/12/2023, 11:31 AM

## 2023-02-12 NOTE — Progress Notes (Signed)
Mobility Specialist Progress Note:   02/12/23 1110  Mobility  Activity Transferred to/from [facility]  Level of Assistance Minimal assist, patient does 75% or more (+2)  Assistive Device BSC;Front wheel walker  Distance Ambulated (ft) 5 ft  Activity Response Tolerated well  Mobility Referral Yes  $Mobility charge 1 Mobility  Mobility Specialist Start Time (ACUTE ONLY) 1110  Mobility Specialist Stop Time (ACUTE ONLY) 1118  Mobility Specialist Time Calculation (min) (ACUTE ONLY) 8 min   RN requesting assistance to transfer pt to chair. Pt stood with minA+2 on first attempt, requesting to sit back down d/t feeling like she needed to use the BR again. On second stand, pt only requiring minG. No c/o throughout. Pt left in chair with all needs met.   Addison Lank Mobility Specialist Please contact via SecureChat or  Rehab office at [phone]

## 2023-02-13 ENCOUNTER — Inpatient Hospital Stay (HOSPITAL_COMMUNITY): Payer: Medicare Other

## 2023-02-13 DIAGNOSIS — I5033 Acute on chronic diastolic (congestive) heart failure: Secondary | ICD-10-CM | POA: Diagnosis not present

## 2023-02-13 LAB — BASIC METABOLIC PANEL
Anion gap: 11 (ref 5–15)
BUN: 62 mg/dL — ABNORMAL HIGH (ref 8–23)
CO2: 30 mmol/L (ref 22–32)
Calcium: 9.2 mg/dL (ref 8.9–10.3)
Chloride: 95 mmol/L — ABNORMAL LOW (ref 98–111)
Creatinine, Ser: 1.65 mg/dL — ABNORMAL HIGH (ref 0.44–1.00)
GFR, Estimated: 30 mL/min — ABNORMAL LOW (ref >60)
Glucose, Bld: 113 mg/dL — ABNORMAL HIGH (ref 70–99)
Potassium: 4.7 mmol/L (ref 3.5–5.1)
Sodium: 136 mmol/L (ref 135–145)

## 2023-02-13 LAB — FERRITIN: Ferritin: 132 ng/mL (ref 11–307)

## 2023-02-13 LAB — IRON AND TIBC
Iron: 18 ug/dL — ABNORMAL LOW (ref 28–170)
Saturation Ratios: 7 % — ABNORMAL LOW (ref 10.4–31.8)
TIBC: 263 ug/dL (ref 250–450)
UIBC: 245 ug/dL

## 2023-02-13 LAB — RETICULOCYTES
Immature Retic Fract: 15.2 % (ref 2.3–15.9)
RBC.: 3.14 MIL/uL — ABNORMAL LOW (ref 3.87–5.11)
Retic Count, Absolute: 94.2 10*3/uL (ref 19.0–186.0)
Retic Ct Pct: 3 % (ref 0.4–3.1)

## 2023-02-13 LAB — VITAMIN B12: Vitamin B-12: 405 pg/mL (ref 180–914)

## 2023-02-13 LAB — CBC
HCT: 30.2 % — ABNORMAL LOW (ref 36.0–46.0)
Hemoglobin: 9.4 g/dL — ABNORMAL LOW (ref 12.0–15.0)
MCH: 29.8 pg (ref 26.0–34.0)
MCHC: 31.1 g/dL (ref 30.0–36.0)
MCV: 95.9 fL (ref 80.0–100.0)
Platelets: 193 10*3/uL (ref 150–400)
RBC: 3.15 MIL/uL — ABNORMAL LOW (ref 3.87–5.11)
RDW: 15.6 % — ABNORMAL HIGH (ref 11.5–15.5)
WBC: 7.3 10*3/uL (ref 4.0–10.5)
nRBC: 0 % (ref 0.0–0.2)

## 2023-02-13 LAB — GLUCOSE, CAPILLARY: Glucose-Capillary: 144 mg/dL — ABNORMAL HIGH (ref 70–99)

## 2023-02-13 LAB — CULTURE, BODY FLUID W GRAM STAIN -BOTTLE

## 2023-02-13 LAB — FOLATE: Folate: 7.8 ng/mL (ref >5.9)

## 2023-02-13 MED ORDER — FUROSEMIDE 10 MG/ML IJ SOLN
80.0000 mg | Freq: Once | INTRAMUSCULAR | Status: AC
  Administered 2023-02-13: 80 mg via INTRAVENOUS
  Filled 2023-02-13: qty 8

## 2023-02-13 MED ORDER — HYDROMORPHONE HCL 1 MG/ML IJ SOLN: 0.5000 mg | Freq: Once | INTRAMUSCULAR | Status: DC

## 2023-02-13 MED ORDER — SODIUM CHLORIDE 0.9 % IV SOLN
250.0000 mg | Freq: Every day | INTRAVENOUS | Status: AC
  Administered 2023-02-13 – 2023-02-14 (×2): 250 mg via INTRAVENOUS
  Filled 2023-02-13 (×2): qty 20

## 2023-02-13 MED ORDER — FUROSEMIDE 10 MG/ML IJ SOLN
40.0000 mg | Freq: Once | INTRAMUSCULAR | Status: AC
  Administered 2023-02-13: 40 mg via INTRAVENOUS
  Filled 2023-02-13: qty 4

## 2023-02-13 NOTE — Progress Notes (Signed)
   02/13/23 1341  Vitals  BP (!) 147/48  MAP (mmHg) 77  BP Location Right Arm  BP Method Automatic  Patient Position (if appropriate) Lying  Pulse Rate 75  Pulse Rate Source Monitor  ECG Heart Rate 75  Resp (!) 32  Level of Consciousness  Level of Consciousness Alert  MEWS COLOR  MEWS Score Color Yellow  Oxygen Therapy  SpO2 93 %  O2 Device Nasal Cannula  O2 Flow Rate (L/min) 4 L/min  Pain Assessment  Pain Scale 0-10  Pain Score 6  Pain Type Acute pain;Chronic pain  Pain Location Back  Pain Intervention(s) Repositioned;RN made aware  MEWS Score  MEWS Temp 0  MEWS Systolic 0  MEWS Pulse 0  MEWS RR 2  MEWS LOC 0  MEWS Score 2  Provider Notification  Provider Name/Title Jomarie Longs  Date Provider Notified 02/13/23  Time Provider Notified 1343  Method of Notification Page  Notification Reason Other (Comment) (Yellow MEWS)  Provider response Evaluate remotely  Date of Provider Response 02/13/23  Time of Provider Response 1345

## 2023-02-13 NOTE — Progress Notes (Signed)
PROGRESS NOTE    [name]  ZOX:[id number] DOB: February 27, 1935 DOA: 02/07/2023 PCP: Donita Brooks, MD   88/F w/aortic stenosis, hypertension, CAD, atrial fibrillation, pulmonary hypertension who presented with dyspnea. Reported four weeks of worsening dyspnea, orthopnea and 40 lbs weight gain.  -Recent hospitalization 5/4-5/13 for closed compression fracture L5 and closed right fibular fracture.  -admitted 6/3 w/ respiratory distress and hypoxia. -CXR w/cardiomegaly with bilateral hilar vascular congestion, bilateral increased lung markings and bilateral pleural effusions.  -Left thoracentesis 6/6 -Slowly improving with diuretics -6/8 onwards intermittent urinary retention, AKI on CKD, diuretics held -6/9, overnight with dyspnea, Lasix x 1, Foley catheter placed  Subjective: -Having urinary retention today, was able to void multiple times during the day yesterday but none overnight or this morning, also had respiratory distress and dyspnea on last night, chest x-ray reviewed, received Lasix x 1  Assessment and Plan:  Acute on chronic diastolic heart failure (HCC) Severe pulmonary hypertension Pleural effusion Echo with EF 65 to 70%, mild LVH, RV preserved, severely elevated PA systolic pressures, severe TR, moderate aortic stenosis.  -CTA chest with moderate to large bilateral pleural effusions,  -Left thoracentesis 6/6, 600 mL drained -Diuresed with IV Lasix, 5.3 L negative, diuretics held yesterday with AKI, clinically appears volume overloaded, -CHF team following, will need additional Lasix today -Aldactone stopped with hyperkalemia and no SGLT2i due to UTIs -Continue amlodipine, clonidine -Appreciate CHF team input, palliative care following -PT eval completed, home health recommended  AKI on CKD 3a -Creatinine has bumped to 1.6 yesterday, diuretics held, Foley placed this morning, creatinine plateauing   Persistent atrial fibrillation (HCC) Rate controlled.  In A-fib at this  time -Resumed apixaban   Coronary artery disease -stable   Essential hypertension -Improving, meds as above   Anemia of chronic disease -Hemoglobin stabilizing again, anemia panel with severe iron deficiency, will administer IV iron   COPD (chronic obstructive pulmonary disease) (HCC) -continue bronchodilator therapy.    GERD (gastroesophageal reflux disease) Continue antiacid therapy.   Closed compression fracture of L5 lumbar vertebra, initial encounter (HCC) -pain control, PT and Ot/.   Lung nodules -needs FU  Thyroid nodules -needs FU   Pressure injury of skin Continue local care  DVT prophylaxis: Apixaban Code Status: DNR Family Communication: Niece at bedside Disposition Plan: Home with home health services  Consultants:    Procedures:   Antimicrobials:    Objective: Vitals:   02/13/23 0322 02/13/23 0815 02/13/23 0825 02/13/23 0928  BP:  (!) 150/46  (!) 145/45  Pulse:  66 66   Resp:  18 (!) 23   Temp:  98 F (36.7 C)    TempSrc:  Oral    SpO2: 100% 100% 100%   Weight:      Height:        Intake/Output Summary (Last 24 hours) at 02/13/2023 1049 Last data filed at 02/13/2023 0549 Gross per 24 hour  Intake 237 ml  Output 1050 ml  Net -813 ml   Filed Weights   02/10/23 0349 02/12/23 0601 02/13/23 0300  Weight: 82.3 kg 79.6 kg 81 kg    Examination:  General exam: Frail elderly female sitting up in bed, AAOx3 HEENT: Positive JVD CVS: S1-S2, regular rhythm, systolic murmur Lungs: Improved air movement, rare basilar Rales Abdomen: Soft, nontender, bowel sounds present Extremities: 1-2+ edema Skin: No rashes Psychiatry:  Mood & affect appropriate.     Data Reviewed:   CBC: Recent Labs  Lab 02/07/23 1641 02/07/23 1726 02/10/23 0127 02/11/23 0046  02/12/23 0046 02/13/23 0052  WBC 6.8  --  7.2 9.4 6.9 7.3  NEUTROABS 3.8  --   --   --   --   --   HGB 10.6* 11.6* 9.7* 10.7* 8.8* 9.4*  HCT 35.0* 34.0* 31.7* 33.7* 28.3* 30.2*  MCV 99.2   --  97.5 95.2 96.9 95.9  PLT 223  --  191 242 172 193   Basic Metabolic Panel: Recent Labs  Lab 02/07/23 1641 02/07/23 1726 02/09/23 0106 02/10/23 0127 02/11/23 0046 02/12/23 0711 02/13/23 0052  NA 131*   < > 135 134* 136 136 136  K 4.1   < > 4.6 4.5 4.1 5.0 4.7  CL 94*   < > 96* 94* 94* 93* 95*  CO2 31   < > 33* 31 31 31 30   GLUCOSE 134*   < > 112* 122* 148* 142* 113*  BUN 22   < > 32* 39* 38* 52* 62*  CREATININE 1.17*   < > 1.38* 1.34* 1.28* 1.69* 1.65*  CALCIUM 8.8*   < > 8.9 9.0 9.6 9.2 9.2  MG 1.9  --  2.1  --   --   --   --    < > = values in this interval not displayed.   GFR: Estimated Creatinine Clearance: 25.7 mL/min (A) (by C-G formula based on SCr of 1.65 mg/dL (H)). Liver Function Tests: Recent Labs  Lab 02/07/23 1641 02/10/23 1556  AST 35 19  ALT 23 17  ALKPHOS 84 74  BILITOT 0.5 0.6  PROT 6.1* 5.7*  ALBUMIN 3.1* 2.8*   No results for input(s): "LIPASE", "AMYLASE" in the last 168 hours. No results for input(s): "AMMONIA" in the last 168 hours. Coagulation Profile: Recent Labs  Lab 02/08/23 1421  INR 1.0   Cardiac Enzymes: No results for input(s): "CKTOTAL", "CKMB", "CKMBINDEX", "TROPONINI" in the last 168 hours. BNP (last 3 results) No results for input(s): "PROBNP" in the last 8760 hours. HbA1C: No results for input(s): "HGBA1C" in the last 72 hours. CBG: No results for input(s): "GLUCAP" in the last 168 hours. Lipid Profile: No results for input(s): "CHOL", "HDL", "LDLCALC", "TRIG", "CHOLHDL", "LDLDIRECT" in the last 72 hours. Thyroid Function Tests: No results for input(s): "TSH", "T4TOTAL", "FREET4", "T3FREE", "THYROIDAB" in the last 72 hours. Anemia Panel: Recent Labs    02/13/23 0052  VITAMINB12 405  FOLATE 7.8  FERRITIN 132  TIBC 263  IRON 18*  RETICCTPCT 3.0   Urine analysis:    Component Value Date/Time   COLORURINE YELLOW 01/09/2023 0948   APPEARANCEUR CLEAR 01/09/2023 0948   LABSPEC 1.012 01/09/2023 0948   PHURINE  5.0 01/09/2023 0948   GLUCOSEU NEGATIVE 01/09/2023 0948   HGBUR SMALL (A) 01/09/2023 0948   BILIRUBINUR NEGATIVE 01/09/2023 0948   KETONESUR NEGATIVE 01/09/2023 0948   PROTEINUR NEGATIVE 01/09/2023 0948   UROBILINOGEN 0.2 11/04/2013 1321   NITRITE NEGATIVE 01/09/2023 0948   LEUKOCYTESUR NEGATIVE 01/09/2023 0948   Sepsis Labs: @LABRCNTIP (procalcitonin:4,lacticidven:4)  ) Recent Results (from the past 240 hour(s))  Culture, body fluid w Gram Stain-bottle     Status: None (Preliminary result)   Collection Time: 02/10/23 12:04 PM   Specimen: Pleura  Result Value Ref Range Status   Specimen Description PLEURAL  Final   Special Requests NONE  Final   Culture   Final    NO GROWTH 3 DAYS Performed at [facility], [address] [place], [place] [address]    Report Status PENDING  Incomplete  Gram  stain     Status: None   Collection Time: 02/10/23 12:04 PM   Specimen: Pleura  Result Value Ref Range Status   Specimen Description PLEURAL  Final   Special Requests NONE  Final   Gram Stain   Final    MODERATE WBC PRESENT, PREDOMINANTLY MONONUCLEAR RARE SQUAMOUS EPITHELIAL CELLS PRESENT NO ORGANISMS SEEN Performed at [facility], [address] [place], [place] [address]    Report Status 02/10/2023 FINAL  Final     Radiology Studies: DG CHEST PORT 1 VIEW  Result Date: 02/13/2023 CLINICAL DATA:  Shortness of breath EXAM: PORTABLE CHEST 1 VIEW COMPARISON:  Three days ago FINDINGS: Cardiopericardial enlargement. Bilateral pleural effusion which is increased. Diffuse interstitial opacity with cephalized blood flow. Aeration has worsened from before. IMPRESSION: Worsening CHF and pleural effusions. Electronically Signed   By: Tiburcio Pea M.D.   On: 02/13/2023 04:02     Scheduled Meds:  amLODipine  10 mg Oral Daily   apixaban  5 mg Oral BID   bisacodyl  10 mg Rectal Once   Chlorhexidine Gluconate Cloth  6 each Topical Daily   cloNIDine  0.2 mg Oral BID   feeding  supplement  237 mL Oral BID BM   guaiFENesin  600 mg Oral BID   hydrALAZINE  25 mg Oral TID   ipratropium-albuterol  3 mL Nebulization BID   pantoprazole  40 mg Oral Daily   polyethylene glycol  17 g Oral Daily   sodium chloride  2 g Oral BID WC   Continuous Infusions:   LOS: 5 days    Time spent:    Zannie Cove, MD [facility]   02/13/2023, 10:49 AM

## 2023-02-13 NOTE — Progress Notes (Signed)
Mobility Specialist Progress Note:   02/13/23 0920  Mobility  Activity Transferred from bed to chair  Level of Assistance Minimal assist, patient does 75% or more (+2)  Assistive Device Front wheel walker  Distance Ambulated (ft) 3 ft  Activity Response Tolerated fair  Mobility Referral Yes  $Mobility charge 1 Mobility  Mobility Specialist Start Time (ACUTE ONLY) 0920  Mobility Specialist Stop Time (ACUTE ONLY) 0932  Mobility Specialist Time Calculation (min) (ACUTE ONLY) 12 min   Pt agreeable to transfer to chair. Required minA+2 to stand and pivot to chair. Pt c/o significant BLE pain, RN aware. Left with all needs met.   Addison Lank Mobility Specialist Please contact via SecureChat or  Rehab office at [phone]

## 2023-02-13 NOTE — Progress Notes (Signed)
Pt work of breathing increased this AM around 0300.  Spoke with Myriam Jacobson, RT and evaluated pt.  RT recommending CXR and gave duoneb.  Dr. Arville Care notified of this and order placed.  Dr. Arville Care given results of CXR as well and notified that pt has not made any urine throughout this shift and of current bladder scan results.  Awaiting response at this time.  Will continue to monitor.

## 2023-02-13 NOTE — Plan of Care (Signed)
  Problem: Education: Goal: Knowledge of General Education information will improve Description: Including pain rating scale, medication(s)/side effects and non-pharmacologic comfort measures Outcome: Progressing   Problem: Health Behavior/Discharge Planning: Goal: Ability to manage health-related needs will improve Outcome: Progressing   Problem: Clinical Measurements: Goal: Ability to maintain clinical measurements within normal limits will improve Outcome: Progressing Goal: Will remain free from infection Outcome: Progressing Goal: Cardiovascular complication will be avoided Outcome: Progressing   Problem: Activity: Goal: Risk for activity intolerance will decrease Outcome: Progressing   Problem: Elimination: Goal: Will not experience complications related to bowel motility Outcome: Progressing   Problem: Safety: Goal: Ability to remain free from injury will improve Outcome: Progressing   Problem: Skin Integrity: Goal: Risk for impaired skin integrity will decrease Outcome: Progressing   Problem: Nutrition: Goal: Adequate nutrition will be maintained Outcome: Not Progressing   Problem: Coping: Goal: Level of anxiety will decrease Outcome: Not Progressing   Problem: Elimination: Goal: Will not experience complications related to urinary retention Outcome: Not Progressing   Problem: Pain Managment: Goal: General experience of comfort will improve Outcome: Not Progressing

## 2023-02-13 NOTE — Progress Notes (Signed)
Patient ID: [name], female   DOB: 04/19/1935, 88 y.o.   MRN: [id number]     Advanced Heart Failure Rounding Note  PCP-Cardiologist: Armanda Magic, MD   Subjective:    Weight down another 6 lbs, breathing is better.   S/p L thoracentesis, not enough fluid on right for thoracentesis.   - 1L urine output over the past 24h - This AM extremely anxious in respiratory distress; tachypneic with JVP up to 12cm.   Objective:   Weight Range: 81 kg Body mass index is 29.72 kg/m.   Vital Signs:   Temp:  [98 F (36.7 C)-99.6 F (37.6 C)] 98 F (36.7 C) (06/09 0815) Pulse Rate:  [64-78] 66 (06/09 0825) Resp:  [18-23] 23 (06/09 0825) BP: (127-150)/(40-100) 145/45 (06/09 0928) SpO2:  [90 %-100 %] 100 % (06/09 0825) Weight:  [81 kg] 81 kg (06/09 0300) Last BM Date : 02/12/23  Weight change: Filed Weights   02/10/23 0349 02/12/23 0601 02/13/23 0300  Weight: 82.3 kg 79.6 kg 81 kg    Intake/Output:   Intake/Output Summary (Last 24 hours) at 02/13/2023 1253 Last data filed at 02/13/2023 0549 Gross per 24 hour  Intake --  Output 750 ml  Net -750 ml    Physical Exam    General: NAD Neck: JVP 14 Lungs: Clear to auscultation bilaterally with normal respiratory effort. CV: Nondisplaced PMI.  Heart irregular S1/S2, no S3/S4, 2/6 SEM RUSB.  Trace ankle edema.  Abdomen: Soft, nontender, no hepatosplenomegaly, no distention.  Skin: Intact without lesions or rashes.  Neurologic: Alert and oriented x 3.  Psych: Normal affect. Extremities: No clubbing or cyanosis.  HEENT: Normal.    Telemetry   A fib 70s (Personally reviewed)    EKG    No new EKG today  Labs    CBC Recent Labs    02/12/23 0046 02/13/23 0052  WBC 6.9 7.3  HGB 8.8* 9.4*  HCT 28.3* 30.2*  MCV 96.9 95.9  PLT 172 193    Basic Metabolic Panel Recent Labs    24/40/10 0711 02/13/23 0052  NA 136 136  K 5.0 4.7  CL 93* 95*  CO2 31 30  GLUCOSE 142* 113*  BUN 52* 62*  CREATININE 1.69* 1.65*  CALCIUM  9.2 9.2    Liver Function Tests Recent Labs    02/10/23 1556  AST 19  ALT 17  ALKPHOS 74  BILITOT 0.6  PROT 5.7*  ALBUMIN 2.8*    No results for input(s): "LIPASE", "AMYLASE" in the last 72 hours. Cardiac Enzymes No results for input(s): "CKTOTAL", "CKMB", "CKMBINDEX", "TROPONINI" in the last 72 hours.  BNP: BNP (last 3 results) Recent Labs    11/18/22 0635 12/14/22 1246 02/07/23 1641  BNP 136.1* 117.9* 473.9*     ProBNP (last 3 results) No results for input(s): "PROBNP" in the last 8760 hours.   D-Dimer No results for input(s): "DDIMER" in the last 72 hours. Hemoglobin A1C No results for input(s): "HGBA1C" in the last 72 hours. Fasting Lipid Panel No results for input(s): "CHOL", "HDL", "LDLCALC", "TRIG", "CHOLHDL", "LDLDIRECT" in the last 72 hours. Thyroid Function Tests No results for input(s): "TSH", "T4TOTAL", "T3FREE", "THYROIDAB" in the last 72 hours.  Invalid input(s): "FREET3"  Other results:  Imaging  DG CHEST PORT 1 VIEW  Result Date: 02/13/2023 CLINICAL DATA:  Shortness of breath EXAM: PORTABLE CHEST 1 VIEW COMPARISON:  Three days ago FINDINGS: Cardiopericardial enlargement. Bilateral pleural effusion which is increased. Diffuse interstitial opacity with cephalized blood flow. Aeration  has worsened from before. IMPRESSION: Worsening CHF and pleural effusions. Electronically Signed   By: Tiburcio Pea M.D.   On: 02/13/2023 04:02    Medications:     Scheduled Medications:  amLODipine  10 mg Oral Daily   apixaban  5 mg Oral BID   bisacodyl  10 mg Rectal Once   Chlorhexidine Gluconate Cloth  6 each Topical Daily   cloNIDine  0.2 mg Oral BID   feeding supplement  237 mL Oral BID BM   furosemide  80 mg Intravenous Once   guaiFENesin  600 mg Oral BID   hydrALAZINE  25 mg Oral TID    HYDROmorphone (DILAUDID) injection  0.5 mg Intravenous Once   ipratropium-albuterol  3 mL Nebulization BID   pantoprazole  40 mg Oral Daily   polyethylene glycol   17 g Oral Daily   sodium chloride  2 g Oral BID WC    Infusions:  ferric gluconate (FERRLECIT) IVPB 250 mg (02/13/23 1212)    PRN Medications: acetaminophen **OR** acetaminophen, clonazePAM, famotidine, hydrALAZINE, ipratropium-albuterol, ondansetron **OR** ondansetron (ZOFRAN) IV, ondansetron, mouth rinse, oxyCODONE, ranolazine, sodium chloride   Patient Profile  88 y.o. with history of poorly controlled HTN, chronic atrial fibrillation, chronic diastolic CHF, and prior episode of Takotsubo cardiomyopathy. AHF team asked to see for acute on chronic systolic heart failure.   Assessment/Plan   1. Acute on chronic diastolic CHF: NYHA class III symptoms, slowly progressive over time.  Mild volume overload on exam.  RHC/LHC in 4/22 showed nonobstructive CAD; mean RA 5, PA 44/10, mean PCWP 20, CI 4.89, PVR < 1 WU (pulmonary venous hypertension).  Acute on chronic diastolic CHF in setting of cutting back diuretic after recent admission. Echo this admission with EF 65-70%, mild LVH, normal RV, mild MR, moderate AS with mean gradient 16 mmHg.  CTA chest negative for PE, showed mod-large bilateral effusions.  She has diuresed well and weight is down.  Much less LE edema though JVP still elevated.  Creatinine up to 1.69.  - Stop spironolactone (has not tolerated in past due to hyperkalemia.  - Unable to tolerate Jardiance due to frequent UTIs.  - she has declined Cardiomems - Had workup for cardiac amyloidosis in the past with equivocal PYP scan, would repeat PYP scan in the future. - Tachypneic on exam this AM; very anxious breathing 20-30/min. CXR with pulmonary edema and crackles on exam. IV lasix 80mg  x 1 this AM. Will monitor urine output. She appears to have significant underlying anxiety also. Likely a poor candidate for any advanced therapies. Would focus on symptom management at this point.  2.  CAD: Nonobstructive on 4/22 cath. She has been intolerant of statins and Zetia.   She has had  atypical chest pain in the past. Admission 7/23 with CP, elevated HsTnI, CT ruled out acute aortic syndrome, echo ok. Pain felt to be related to coronary microvascular dysfunction.   - No ASA given apixaban use.  - Intolerant of statins and Zetia 3. Chronic atrial fibrillation/bradycardia: She is off nodal blockade. - Continue Eliquis 5 mg bid 4. HTN: BP controlled.  - Off spironolactone and losartan with hyperkalemia - Continue amlodipine 10 mg daily - Continue Clonidine 0.2 mg BID - Continue hydralazine 25 mg TID 5. Aortic stenosis: Mild-moderate on last echo.  - mod on echo 6/4  6. Bilateral pleural effusions: CT chest 02/08/23 showed bilateral moderate to large pleural effusions with moderate to marked severity bilateral lower lobe compressive atelectasis.  S/p left thoracentesis (  transudate),  not enough fluid for right thoracentesis.  7. Physical Deconditioning: Recent fall with closed compression fracture of L5 vertebra. - Continue working with PT/OT 8. Hx of Hyperkalemia:  - Spironolactone stopped.  9. AKI on CKD stage 3:   Length of Stay: 5  Kynnadi Dicenso, DO  02/13/2023, 12:53 PM  Advanced Heart Failure Team Pager [phone] (M-F; 7a - 5p)  Please contact [facility] for night-coverage after hours (5p -7a ) and weekends on [email or web address]

## 2023-02-14 ENCOUNTER — Inpatient Hospital Stay (HOSPITAL_COMMUNITY): Payer: Medicare Other

## 2023-02-14 DIAGNOSIS — J439 Emphysema, unspecified: Secondary | ICD-10-CM | POA: Diagnosis not present

## 2023-02-14 DIAGNOSIS — S32050A Wedge compression fracture of fifth lumbar vertebra, initial encounter for closed fracture: Secondary | ICD-10-CM | POA: Diagnosis not present

## 2023-02-14 DIAGNOSIS — R0600 Dyspnea, unspecified: Secondary | ICD-10-CM | POA: Diagnosis not present

## 2023-02-14 DIAGNOSIS — I5033 Acute on chronic diastolic (congestive) heart failure: Secondary | ICD-10-CM | POA: Diagnosis not present

## 2023-02-14 HISTORY — PX: IR THORACENTESIS RIGHT ASP PLEURAL SPACE W/IMG GUIDE: IMG5380

## 2023-02-14 LAB — COMPREHENSIVE METABOLIC PANEL
ALT: 41 U/L (ref 0–44)
AST: 48 U/L — ABNORMAL HIGH (ref 15–41)
Albumin: 2.7 g/dL — ABNORMAL LOW (ref 3.5–5.0)
Alkaline Phosphatase: 75 U/L (ref 38–126)
Anion gap: 10 (ref 5–15)
BUN: 71 mg/dL — ABNORMAL HIGH (ref 8–23)
CO2: 33 mmol/L — ABNORMAL HIGH (ref 22–32)
Calcium: 9.5 mg/dL (ref 8.9–10.3)
Chloride: 94 mmol/L — ABNORMAL LOW (ref 98–111)
Creatinine, Ser: 1.36 mg/dL — ABNORMAL HIGH (ref 0.44–1.00)
GFR, Estimated: 38 mL/min — ABNORMAL LOW (ref >60)
Glucose, Bld: 134 mg/dL — ABNORMAL HIGH (ref 70–99)
Potassium: 4.6 mmol/L (ref 3.5–5.1)
Sodium: 137 mmol/L (ref 135–145)
Total Bilirubin: 0.7 mg/dL (ref 0.3–1.2)
Total Protein: 6 g/dL — ABNORMAL LOW (ref 6.5–8.1)

## 2023-02-14 LAB — BODY FLUID CELL COUNT WITH DIFFERENTIAL
Eos, Fluid: 0 %
Lymphs, Fluid: 94 %
Monocyte-Macrophage-Serous Fluid: 3 % — ABNORMAL LOW (ref 50–90)
Neutrophil Count, Fluid: 3 % (ref 0–25)
Total Nucleated Cell Count, Fluid: 84 cu mm (ref 0–1000)

## 2023-02-14 LAB — PROTEIN, PLEURAL OR PERITONEAL FLUID: Total protein, fluid: <3 g/dL

## 2023-02-14 LAB — LACTATE DEHYDROGENASE, PLEURAL OR PERITONEAL FLUID: LD, Fluid: 41 U/L — ABNORMAL HIGH (ref 3–23)

## 2023-02-14 MED ORDER — FUROSEMIDE 10 MG/ML IJ SOLN
20.0000 mg/h | INTRAVENOUS | Status: DC
  Administered 2023-02-14 – 2023-02-16 (×3): 20 mg/h via INTRAVENOUS
  Filled 2023-02-14 (×4): qty 20

## 2023-02-14 MED ORDER — FUROSEMIDE 10 MG/ML IJ SOLN: 80.0000 mg | Freq: Once | INTRAMUSCULAR | Status: DC

## 2023-02-14 MED ORDER — FUROSEMIDE 10 MG/ML IJ SOLN
80.0000 mg | Freq: Once | INTRAMUSCULAR | Status: AC
  Administered 2023-02-14: 80 mg via INTRAVENOUS
  Filled 2023-02-14: qty 8

## 2023-02-14 MED ORDER — LIDOCAINE HCL 1 % IJ SOLN
INTRAMUSCULAR | Status: AC
  Filled 2023-02-14: qty 20

## 2023-02-14 MED ORDER — METOLAZONE 5 MG PO TABS: 5.0000 mg | ORAL_TABLET | Freq: Once | ORAL | Status: DC

## 2023-02-14 MED ORDER — MORPHINE SULFATE (PF) 2 MG/ML IV SOLN
1.0000 mg | INTRAVENOUS | Status: DC | PRN
  Administered 2023-02-14 – 2023-02-15 (×8): 1 mg via INTRAVENOUS
  Filled 2023-02-14 (×8): qty 1

## 2023-02-14 MED ORDER — METOLAZONE 5 MG PO TABS
5.0000 mg | ORAL_TABLET | Freq: Once | ORAL | Status: AC
  Administered 2023-02-14: 5 mg via ORAL
  Filled 2023-02-14: qty 1

## 2023-02-14 MED ORDER — LIDOCAINE 5 % EX PTCH
1.0000 | MEDICATED_PATCH | Freq: Every day | CUTANEOUS | Status: DC
  Administered 2023-02-14 – 2023-02-16 (×3): 1 via TRANSDERMAL
  Filled 2023-02-14 (×3): qty 1

## 2023-02-14 NOTE — Progress Notes (Addendum)
Patient ID: [name], female   DOB: August 11, 1935, 88 y.o.   MRN: [id number]     Advanced Heart Failure Rounding Note  PCP-Cardiologist: Armanda Magic, MD   Subjective:    S/p L thoracentesis 6/6, not enough fluid on right for thoracentesis.   - 1.4L urine output over the past 24h with 80 IV lasix. Weight appears unreliable, "up 11 lbs". Is volume overloaded.  - Anxious, with chest pressure. Tachypnea continues. CXR yesterday with worsening pleural effusions.   Objective:   Weight Range: 84.9 kg Body mass index is 31.15 kg/m.   Vital Signs:   Temp:  [97.8 F (36.6 C)-99 F (37.2 C)] 97.8 F (36.6 C) (06/10 0513) Pulse Rate:  [57-75] 68 (06/10 0513) Resp:  [18-32] 25 (06/10 0513) BP: (100-150)/(39-51) 149/51 (06/10 0513) SpO2:  [91 %-100 %] 98 % (06/10 0513) Weight:  [84.9 kg] 84.9 kg (06/10 0513) Last BM Date : 02/13/23  Weight change: Filed Weights   02/12/23 0601 02/13/23 0300 02/14/23 0513  Weight: 79.6 kg 81 kg 84.9 kg   Intake/Output:   Intake/Output Summary (Last 24 hours) at 02/14/2023 0802 Last data filed at 02/14/2023 0517 Gross per 24 hour  Intake 722.29 ml  Output 1400 ml  Net -677.71 ml   Physical Exam  General:  ill appearing.  HEENT: +glasses, 4L [place] Neck: supple. JVD to jaw. Carotids 2+ bilat; no bruits. No lymphadenopathy or thyromegaly appreciated. Cor: PMI nondisplaced. Regular rate & irregular rhythm. No rubs, gallops or murmurs. Lungs: diminished bases, + tachypnea Abdomen: soft, nontender, nondistended. No hepatosplenomegaly. No bruits or masses. Good bowel sounds. Extremities: no cyanosis, clubbing, rash, non-pitting BLE edema  Neuro: alert & oriented x 3, cranial nerves grossly intact. moves all 4 extremities w/o difficulty. Anxious.  Telemetry   A fib 70s (Personally reviewed)    EKG    A fib 80s  Labs    CBC Recent Labs    02/12/23 0046 02/13/23 0052  WBC 6.9 7.3  HGB 8.8* 9.4*  HCT 28.3* 30.2*  MCV 96.9 95.9  PLT 172 193    Basic Metabolic Panel Recent Labs    40/98/11 0711 02/13/23 0052  NA 136 136  K 5.0 4.7  CL 93* 95*  CO2 31 30  GLUCOSE 142* 113*  BUN 52* 62*  CREATININE 1.69* 1.65*  CALCIUM 9.2 9.2   Liver Function Tests No results for input(s): "AST", "ALT", "ALKPHOS", "BILITOT", "PROT", "ALBUMIN" in the last 72 hours. No results for input(s): "LIPASE", "AMYLASE" in the last 72 hours. Cardiac Enzymes No results for input(s): "CKTOTAL", "CKMB", "CKMBINDEX", "TROPONINI" in the last 72 hours.  BNP: BNP (last 3 results) Recent Labs    11/18/22 0635 12/14/22 1246 02/07/23 1641  BNP 136.1* 117.9* 473.9*    ProBNP (last 3 results) No results for input(s): "PROBNP" in the last 8760 hours.   D-Dimer No results for input(s): "DDIMER" in the last 72 hours. Hemoglobin A1C No results for input(s): "HGBA1C" in the last 72 hours. Fasting Lipid Panel No results for input(s): "CHOL", "HDL", "LDLCALC", "TRIG", "CHOLHDL", "LDLDIRECT" in the last 72 hours. Thyroid Function Tests No results for input(s): "TSH", "T4TOTAL", "T3FREE", "THYROIDAB" in the last 72 hours.  Invalid input(s): "FREET3"  Other results:  Imaging  No results found.  Medications:     Scheduled Medications:  amLODipine  10 mg Oral Daily   apixaban  5 mg Oral BID   bisacodyl  10 mg Rectal Once   Chlorhexidine Gluconate Cloth  6 each Topical  Daily   cloNIDine  0.2 mg Oral BID   feeding supplement  237 mL Oral BID BM   guaiFENesin  600 mg Oral BID   hydrALAZINE  25 mg Oral TID    HYDROmorphone (DILAUDID) injection  0.5 mg Intravenous Once   ipratropium-albuterol  3 mL Nebulization BID   pantoprazole  40 mg Oral Daily   polyethylene glycol  17 g Oral Daily   sodium chloride  2 g Oral BID WC   Infusions:  ferric gluconate (FERRLECIT) IVPB Stopped (02/13/23 1549)   PRN Medications: acetaminophen **OR** acetaminophen, clonazePAM, famotidine, hydrALAZINE, ipratropium-albuterol, ondansetron **OR** ondansetron  (ZOFRAN) IV, ondansetron, mouth rinse, oxyCODONE, ranolazine, sodium chloride  Patient Profile  88 y.o. with history of poorly controlled HTN, chronic atrial fibrillation, chronic diastolic CHF, and prior episode of Takotsubo cardiomyopathy. AHF team asked to see for acute on chronic systolic heart failure.   Assessment/Plan  1. Acute on chronic diastolic CHF: NYHA class III symptoms, slowly progressive over time.  Mild volume overload on exam.  RHC/LHC in 4/22 showed nonobstructive CAD; mean RA 5, PA 44/10, mean PCWP 20, CI 4.89, PVR < 1 WU (pulmonary venous hypertension).  Acute on chronic diastolic CHF in setting of cutting back diuretic after recent admission. Echo this admission with EF 65-70%, mild LVH, normal RV, mild MR, moderate AS with mean gradient 16 mmHg.  CTA chest negative for PE, showed mod-large bilateral effusions. Much less LE edema though JVP still elevated.  Creatinine up to 1.69.  - start lasix gtt at 20 mg / hr + metolazone today - Off spironolactone (has not tolerated in past due to hyperkalemia.) - Unable to tolerate Jardiance due to frequent UTIs.  - she has declined Cardiomems - Had workup for cardiac amyloidosis in the past with equivocal PYP scan, would repeat PYP scan in the future. - Persistent tachypnea. Very anxious breathing 20-30/min. CXR with worsening pleural effusions. Likely a poor candidate for any advanced therapies. Would focus on symptom management at this point. Will aggressively diurese, give 80 IV lasix today and metolazone 5 mg.   - Palliative saw patient 6/8, recommended home palliative vs hospice.  2.  CAD: Nonobstructive on 4/22 cath. She has been intolerant of statins and Zetia.   She has had atypical chest pain in the past. Admission 7/23 with CP, elevated HsTnI, CT ruled out acute aortic syndrome, echo ok. Pain felt to be related to coronary microvascular dysfunction.   - No ASA given apixaban use.  - Intolerant of statins and Zetia 3. Chronic  atrial fibrillation/bradycardia: She is off nodal blockade. - Continue Eliquis 5 mg bid 4. HTN: BP controlled.  - Off spironolactone and losartan with hyperkalemia - Continue amlodipine 10 mg daily - Continue Clonidine 0.2 mg BID - Continue hydralazine 25 mg TID 5. Aortic stenosis: Mild-moderate on last echo.  - mod on echo 6/4  6. Bilateral pleural effusions: CT chest 02/08/23 showed bilateral moderate to large pleural effusions with moderate to marked severity bilateral lower lobe compressive atelectasis.  S/p left thoracentesis (6/6) (transudate),  not enough fluid for right thoracentesis.  - CXR yesterday with worsening pleural effusions, will repeat today Flanery need to get tapped or get pulmonology involved. For now will aggressively diurese as above.  - morphine ordered for air hunger 7. Physical Deconditioning: Recent fall with closed compression fracture of L5 vertebra. - Continue working with PT/OT 8. Hx of Hyperkalemia:  - Spironolactone stopped. Keep off.  9. AKI on CKD stage 3: SCr 1.65.  -  baseline 1.1-1.3  Length of Stay: 6  Alen Bleacher, NP  02/14/2023, 8:02 AM  Advanced Heart Failure Team Pager [phone] (M-F; 7a - 5p)  Please contact [facility] for night-coverage after hours (5p -7a ) and weekends on [email or web address]  Agree with above.   Remains very dyspneic and tachypneic. Decent response to IV lasix this am. Underwent R thora with 700cc fluid off as well. Says she feels worse. No CP  General:  Elderly woman. Tachypneic and uncomfortable HEENT: normal Neck: supple. JVP to ear with prominent v-waves. Carotids 2+ bilat; no bruits. No lymphadenopathy or thryomegaly appreciated. Cor: PMI nondisplaced. Regular rate & rhythm. No rubs, gallops or murmurs. Lungs: diffuse crackles Abdomen: soft, nontender, nondistended. No hepatosplenomegaly. No bruits or masses. Good bowel sounds. Extremities: no cyanosis, clubbing, rash, edema Neuro: alert & orientedx3, cranial nerves  grossly intact. moves all 4 extremities w/o difficulty. Affect flat  She is end-stage. Marked volume overload. We discussed transition to full comfort care versus ongoing attempts at volume removal. She is ok with using low-dose morphine pushes for now but is not ready for comfort drips yet.   Will re-bolus IV lasix and start drip at 20/hr. Follow closely.   Arvilla Meres, MD  2:20 PM

## 2023-02-14 NOTE — Progress Notes (Signed)
Patient is complaining of chest pain, and shob at rest.  Bilateral lungs are very diminished.  She has tachypnea at rest.  HR 74, Sat 92% on 2LNC, RR 38, BP 153/48 (79).

## 2023-02-14 NOTE — Progress Notes (Signed)
   02/14/23 1537  Spiritual Encounters  Type of Visit Initial  Care provided to: Pt and family  Conversation partners present during encounter Nurse  Referral source Patient request  Reason for visit Routine spiritual support (Pt. and family request in the setting of poor prognosis)  Spiritual Framework  Presenting Themes Values and beliefs;Meaning/purpose/sources of inspiration (Chaplain listened reflectively to the Pt. story through [place]. Chaplain understands the Pt. 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 survived cancer multiple times and cared for husband and son in poor health. Family is connected to faith community, but clergy is out of town. [name] is an only child.)  Patient Stress Factors Health changes  Goals  Clinical Care Goals [name] requests Pt. remain comfortable.  Interventions  Spiritual Care Interventions Made Established relationship of care and support;Compassionate presence;Reflective listening;Normalization of emotions;Bereavement/grief support;Prayer  Intervention Outcomes  Outcomes Connection to spiritual care;Awareness of support;Reduced anxiety  Spiritual Care Plan  Spiritual Care Issues Still Outstanding Chaplain will continue to follow   This chaplain responded to RN consult for spiritual care. The Pt. is resting comfortably at the time of the visit and opens the space for the Pt. son-[name], Jr. to participate in story telling with the chaplain. [name] accepted the chaplain's invitation for prayer and F/U spiritual care.  Chaplain Stephanie Acre [phone]

## 2023-02-14 NOTE — Progress Notes (Signed)
Civil engineer, contracting [facility]) Liaison Note  Referral received for patient/family interest in Hospice Home. Per Amil Amen, NP PMT patient and family would like to continue aggressive measures for now.   [facility] liaison will touch base with patient's family once given the okay to proceed from medical team.   Please call with any questions or concerns. Thank you  Dionicio Stall, [facility] Liaison [phone]

## 2023-02-14 NOTE — Progress Notes (Signed)
Mobility Specialist Progress Note:    02/14/23 1226  Mobility  Activity Transferred from bed to chair  Level of Assistance +2 (takes two people) Audiological scientist)  Assistive Device Front wheel walker  Activity Response Tolerated well  Mobility Referral Yes  $Mobility charge 1 Mobility  Mobility Specialist Start Time (ACUTE ONLY) 1200  Mobility Specialist Stop Time (ACUTE ONLY) 1215  Mobility Specialist Time Calculation (min) (ACUTE ONLY) 15 min   Pt received in bed, agreeable to transfer to chair. Pt displayed high anxiety throughout session. Pt reassured and educated that she is safe. Pt let in chair w/ call bell and chair alarm on.   Thompson Grayer Mobility Specialist  Please contact vis Secure Chat or  Rehab Office [phone]

## 2023-02-14 NOTE — Progress Notes (Signed)
Palliative Medicine Progress Note   Patient Name: [name]       Date: 02/14/2023 DOB: 24-Dec-1934  Age: 88 y.o. MRN#: [id number] Attending Physician: Zannie Cove, MD Primary Care Physician: Donita Brooks, MD Admit Date: 02/07/2023  Reason for Consultation/Follow-up: {Reason for Consult:23484}  HPI/Patient Profile: 88 y.o. female  with past medical history of HFpEF, severe pulmonary hypertension, aortic stenosis, CAD, atrial fibrillation, chronic hyponatremia, CKD stage III, and anemia of chronic disease.  She was recently admitted to [facility] hospital 01/08/2023 through 01/17/2023 after a fall and was found to have an L5 compression fracture.  She was discharged to [facility] for rehab.  She presented to the ED 02/07/2023 with dyspnea and was admitted with acute on chronic heart failure and pleural effusion.    Palliative Medicine was consulted for goals of care.   Subjective: Per MAR, patient received IV morphine at 1049  Objective:  Physical Exam          Vital Signs: BP (!) 156/46 (BP Location: Left Arm)   Pulse 73   Temp 99.1 F (37.3 C) (Axillary)   Resp (!) 23   Ht 5\' 5"  (1.651 m)   Wt 84.9 kg   SpO2 95%   BMI 31.15 kg/m  SpO2: SpO2: 95 % O2 Device: O2 Device: Nasal Cannula O2 Flow Rate: O2 Flow Rate (L/min): 4 L/min  Intake/output summary:  Intake/Output Summary (Last 24 hours) at 02/14/2023 1127 Last data filed at 02/14/2023 8413 Gross per 24 hour  Intake 522.29 ml  Output 1400 ml  Net -877.71 ml    LBM: Last BM Date : 02/13/23     Palliative Assessment/Data: ***     Palliative Medicine Assessment & Plan   Assessment: Principal Problem:   Acute on chronic diastolic heart failure (HCC) Active Problems:   GERD (gastroesophageal reflux disease)   Essential  hypertension   COPD (chronic obstructive pulmonary disease) (HCC)   Coronary artery disease   Persistent atrial fibrillation (HCC)   Closed compression fracture of L5 lumbar vertebra, initial encounter (HCC)   Anemia of chronic disease   Pressure injury of skin   Heart failure (HCC)    Recommendations/Plan: ***  Goals of Care and Additional Recommendations: Limitations on Scope of Treatment: {Recommended Scope and Preferences:21019}  Code Status:   Prognosis:  {Palliative Care  Prognosis:23504}  Discharge Planning: {Palliative dispostion:23505}  Care plan was discussed with ***  Thank you for allowing the Palliative Medicine Team to assist in the care of this patient.   ***   Merry Proud, NP   Please contact Palliative Medicine Team phone at [phone] for questions and concerns.  For individual providers, please see AMION.

## 2023-02-14 NOTE — Progress Notes (Signed)
Occupational Therapy Treatment Patient Details Name: [name] MRN: [id number] DOB: [birth year] Today's Date: 02/14/2023   History of present illness Pt admitted 6/4 from home, after being discharged from SNF 3 days prior, with hypoxia and edema in abdomen and LEs secondary to CHF exacerbation. 6/6 thoracentesis. Pt with hx of: lumbar compression fractures, R fib head fx treated conservatively, HOH, CHF, colon CA, breast CA, CAD, COPD, CVA, afib.   OT comments  Pt tachypneic (RR 29) with SpO2 of 93% on 4L. Unable to tolerate activity beyond HOB up drinking and grooming and rolling to reposition off buttocks. Son appears to appreciate pt's decline from Friday's therapy visit, pt stating she thought she was going home today until NP told her differently today. Son reports pt is eating very little, pt did request tomato soup for lunch, order placed. Will continue efforts.    Recommendations for follow up therapy are one component of a multi-disciplinary discharge planning process, led by the attending physician.  Recommendations [place] be updated based on patient status, additional functional criteria and insurance authorization.    Assistance Recommended at Discharge Frequent or constant Supervision/Assistance  Patient can return home with the following  A lot of help with walking and/or transfers;A lot of help with bathing/dressing/bathroom;Assistance with cooking/housework;Direct supervision/assist for medications management;Direct supervision/assist for financial management;Assist for transportation;Help with stairs or ramp for entrance   Equipment Recommendations  None recommended by OT    Recommendations for Other Services      Precautions / Restrictions Precautions Precautions: Fall Precaution Comments: watch O2       Mobility Bed Mobility Overal bed mobility: Needs Assistance Bed Mobility: Rolling Rolling: Min assist         General bed mobility comments: assist to roll for  repositioning off buttocks    Transfers                         Balance                                           ADL either performed or assessed with clinical judgement   ADL Overall ADL's : Needs assistance/impaired Eating/Feeding: Set up;Bed level Eating/Feeding Details (indicate cue type and reason): drinking Glucerna, asking for tomato soup for lunch, placed Grooming: Brushing hair;Bed level;Minimal assistance Grooming Details (indicate cue type and reason): assist for thoroughness                                    Extremity/Trunk Assessment              Vision       Perception     Praxis      Cognition Arousal/Alertness: Awake/alert Behavior During Therapy: Flat affect Overall Cognitive Status: Impaired/Different from baseline Area of Impairment: Problem solving, Memory, Safety/judgement                     Memory: Decreased short-term memory   Safety/Judgement: Decreased awareness of deficits   Problem Solving: Slow processing General Comments: leaving medical decisions to her son, anticipated being able to return home today despite SOB and weakness        Exercises      Shoulder Instructions       General Comments  Pertinent Vitals/ Pain       Pain Assessment Pain Assessment: Faces Faces Pain Scale: Hurts a little bit Pain Location: buttocks Pain Descriptors / Indicators: Sore Pain Intervention(s): Repositioned  Home Living                                          Prior Functioning/Environment              Frequency  Min 2X/week        Progress Toward Goals  OT Goals(current goals can now be found in the care plan section)  Progress towards OT goals: Not progressing toward goals - comment  Acute Rehab OT Goals OT Goal Formulation: With patient Time For Goal Achievement: 02/23/23 Potential to Achieve Goals: Fair  Plan Discharge plan remains  appropriate    Co-evaluation                 AM-PAC OT "6 Clicks" Daily Activity     Outcome Measure   Help from another person eating meals?: None Help from another person taking care of personal grooming?: A Little Help from another person toileting, which includes using toliet, bedpan, or urinal?: Total Help from another person bathing (including washing, rinsing, drying)?: A Lot Help from another person to put on and taking off regular upper body clothing?: A Little Help from another person to put on and taking off regular lower body clothing?: Total 6 Click Score: 14    End of Session Equipment Utilized During Treatment: Oxygen  OT Visit Diagnosis: Unsteadiness on feet (R26.81);Other abnormalities of gait and mobility (R26.89);Muscle weakness (generalized) (M62.81);Other (comment) (decreased activity tolerance)   Activity Tolerance Patient limited by fatigue;Treatment limited secondary to medical complications (Comment) (tachypneic)   Patient Left in bed;with call bell/phone within reach;with bed alarm set;with family/visitor present   Nurse Communication          Time: 0950-1009 OT Time Calculation (min): 19 min  Charges: OT General Charges $OT Visit: 1 Visit OT Treatments $Therapeutic Activity: 8-22 mins  .myinfo [name], [name] 02/14/2023, 10:17 AM

## 2023-02-14 NOTE — Consult Note (Signed)
WOC Nurse Consult Note: Reason for Consult: Stage 1 Sacrum, Skin tear L lower extremity  Wound type: 1.  Stage 1 Sacral Pressure Injury 2.  Full thickness L anterior lower leg; appears to be hematoma from trauma  Pressure Injury POA: Yes Measurement: 1.  Stage 1 Pressure Injury Sacrum, 2 cm x 2 cm nonblanchable erythema, skin intact  2.  Left lower leg 2 cm x 2 cm full thickness, intact skin with ?hematoma underneath; patient noted to have ecchymosis to L posterior leg as well   Drainage (amount, consistency, odor) none  Periwound: intact Dressing procedure/placement/frequency:  Stage 1 Sacrum cover with silicone foam, lift foam daily to assess.  Change silicone foam q3 days and prn soiling.  Left lower anterior leg area of hematoma cover with Xeroform gauze [place] [phone]) cut to fit wound bed daily and secure with silicone foam. Lift foam daily to reapply Xeroform gauze. Change foam dressing q3 days and prn soiling.   POC discussed with patient and bedside nurse. WOC team will not follow.  Re-consult if further wound care needs arise.   Thank you,    Priscella Mann MSN, RN-BC, [employer] [phone]

## 2023-02-14 NOTE — Procedures (Signed)
PROCEDURE SUMMARY:  Successful image-guided right thoracentesis. Yielded 700 mL of  hazy yellow fluid. Pt tolerated procedure well. No immediate complications. EBL = trace   Specimen was  sent for labs. CXR ordered.  Please see imaging section of Epic for full dictation.  Lynann Bologna Nason Conradt PA-C 02/14/2023 1:33 PM

## 2023-02-14 NOTE — Progress Notes (Signed)
PROGRESS NOTE    [name]  ZOX:[id number] DOB: 16-Jul-1935 DOA: 02/07/2023 PCP: Donita Brooks, MD   88/F w/aortic stenosis, hypertension, CAD, atrial fibrillation, pulmonary hypertension who presented with dyspnea. Reported four weeks of worsening dyspnea, orthopnea and 40 lbs weight gain.  -Recent hospitalization 5/4-5/13 for closed compression fracture L5 and closed right fibular fracture.  -admitted 6/3 w/ respiratory distress and hypoxia. -CXR w/cardiomegaly with bilateral hilar vascular congestion, bilateral increased lung markings and bilateral pleural effusions.  -Left thoracentesis 6/6 -Slowly improving with diuretics -6/8 onwards intermittent urinary retention, AKI on CKD, diuretics held -6/9, overnight with dyspnea, Lasix x 1, Foley catheter placed  Subjective: -Complains of chest pain and shortness of breath today  Assessment and Plan:  Acute on chronic diastolic heart failure (HCC) Severe pulmonary hypertension Pleural effusion Echo with EF 65 to 70%, mild LVH, RV preserved, severely elevated PA systolic pressures, severe TR, moderate aortic stenosis.  -CTA chest with moderate to large bilateral pleural effusions,  -Left thoracentesis 6/6, 600 mL drained -Remains on IV Lasix, only 6 L negative thus far  -Got 80 Mg IV Lasix this morning, will repeat dose in the afternoon -Aldactone stopped with hyperkalemia and no SGLT2i due to UTIs -Continue amlodipine, clonidine -I worry her prognosis is poor, discussed Home hospice with son today, he recognizes that she Grimes need this, but doesn't think they will be able to manage/care for her at home, will ask Palliative to re-assess  AKI on CKD 3a -Creatinine has bumped to 1.6 yesterday, diuretics held, Foley placed this morning, creatinine plateauing   Persistent atrial fibrillation (HCC) Rate controlled.  In A-fib at this time -Resumed apixaban   Coronary artery disease -stable   Essential hypertension -Improving, meds  as above   Anemia of chronic disease -Hemoglobin stabilizing again, anemia panel with severe iron deficiency, given IV iron   COPD (chronic obstructive pulmonary disease) (HCC) -continue bronchodilator therapy.    GERD (gastroesophageal reflux disease) Continue antiacid therapy.   Closed compression fracture of L5 lumbar vertebra, initial encounter (HCC) -pain control, PT and Ot/.   Lung nodules -needs FU  Thyroid nodules -needs FU   Pressure injury of skin Continue local care  DVT prophylaxis: Apixaban Code Status: DNR Family Communication: Niece at bedside and updated son [name] Disposition Plan: To be determined  Consultants:    Procedures:   Antimicrobials:    Objective: Vitals:   02/14/23 0513 02/14/23 0803 02/14/23 0853 02/14/23 1045  BP: (!) 149/51 (!) 153/48  (!) 156/46  Pulse: 68 77  73  Resp: (!) 25 (!) 25  (!) 23  Temp: 97.8 F (36.6 C) 97.7 F (36.5 C)  99.1 F (37.3 C)  TempSrc: Oral Oral  Axillary  SpO2: 98% 92% 95% 95%  Weight: 84.9 kg     Height:        Intake/Output Summary (Last 24 hours) at 02/14/2023 1140 Last data filed at 02/14/2023 0517 Gross per 24 hour  Intake 522.29 ml  Output 1400 ml  Net -877.71 ml   Filed Weights   02/12/23 0601 02/13/23 0300 02/14/23 0513  Weight: 79.6 kg 81 kg 84.9 kg    Examination:  General exam: Frail elderly female sitting up in bed, AAOx3 HEENT: Positive JVD CVS: S1-S2, regular rhythm, systolic murmur Lungs: Improved air movement, rare basilar Rales Abdomen: Soft, nontender, bowel sounds present Extremities: 1-2+ edema Skin: No rashes Psychiatry:  Mood & affect appropriate.     Data Reviewed:   CBC: Recent Labs  Lab  02/07/23 1641 02/07/23 1726 02/10/23 0127 02/11/23 0046 02/12/23 0046 02/13/23 0052  WBC 6.8  --  7.2 9.4 6.9 7.3  NEUTROABS 3.8  --   --   --   --   --   HGB 10.6* 11.6* 9.7* 10.7* 8.8* 9.4*  HCT 35.0* 34.0* 31.7* 33.7* 28.3* 30.2*  MCV 99.2  --  97.5 95.2 96.9  95.9  PLT 223  --  191 242 172 193   Basic Metabolic Panel: Recent Labs  Lab 02/07/23 1641 02/07/23 1726 02/09/23 0106 02/10/23 0127 02/11/23 0046 02/12/23 0711 02/13/23 0052 02/14/23 0922  NA 131*   < > 135 134* 136 136 136 137  K 4.1   < > 4.6 4.5 4.1 5.0 4.7 4.6  CL 94*   < > 96* 94* 94* 93* 95* 94*  CO2 31   < > 33* 31 31 31 30  33*  GLUCOSE 134*   < > 112* 122* 148* 142* 113* 134*  BUN 22   < > 32* 39* 38* 52* 62* 71*  CREATININE 1.17*   < > 1.38* 1.34* 1.28* 1.69* 1.65* 1.36*  CALCIUM 8.8*   < > 8.9 9.0 9.6 9.2 9.2 9.5  MG 1.9  --  2.1  --   --   --   --   --    < > = values in this interval not displayed.   GFR: Estimated Creatinine Clearance: 32 mL/min (A) (by C-G formula based on SCr of 1.36 mg/dL (H)). Liver Function Tests: Recent Labs  Lab 02/07/23 1641 02/10/23 1556 02/14/23 0922  AST 35 19 48*  ALT 23 17 41  ALKPHOS 84 74 75  BILITOT 0.5 0.6 0.7  PROT 6.1* 5.7* 6.0*  ALBUMIN 3.1* 2.8* 2.7*   No results for input(s): "LIPASE", "AMYLASE" in the last 168 hours. No results for input(s): "AMMONIA" in the last 168 hours. Coagulation Profile: Recent Labs  Lab 02/08/23 1421  INR 1.0   Cardiac Enzymes: No results for input(s): "CKTOTAL", "CKMB", "CKMBINDEX", "TROPONINI" in the last 168 hours. BNP (last 3 results) No results for input(s): "PROBNP" in the last 8760 hours. HbA1C: No results for input(s): "HGBA1C" in the last 72 hours. CBG: Recent Labs  Lab 02/13/23 2042  GLUCAP 144*   Lipid Profile: No results for input(s): "CHOL", "HDL", "LDLCALC", "TRIG", "CHOLHDL", "LDLDIRECT" in the last 72 hours. Thyroid Function Tests: No results for input(s): "TSH", "T4TOTAL", "FREET4", "T3FREE", "THYROIDAB" in the last 72 hours. Anemia Panel: Recent Labs    02/13/23 0052  VITAMINB12 405  FOLATE 7.8  FERRITIN 132  TIBC 263  IRON 18*  RETICCTPCT 3.0   Urine analysis:    Component Value Date/Time   COLORURINE YELLOW 01/09/2023 0948   APPEARANCEUR  CLEAR 01/09/2023 0948   LABSPEC 1.012 01/09/2023 0948   PHURINE 5.0 01/09/2023 0948   GLUCOSEU NEGATIVE 01/09/2023 0948   HGBUR SMALL (A) 01/09/2023 0948   BILIRUBINUR NEGATIVE 01/09/2023 0948   KETONESUR NEGATIVE 01/09/2023 0948   PROTEINUR NEGATIVE 01/09/2023 0948   UROBILINOGEN 0.2 11/04/2013 1321   NITRITE NEGATIVE 01/09/2023 0948   LEUKOCYTESUR NEGATIVE 01/09/2023 0948   Sepsis Labs: @LABRCNTIP (procalcitonin:4,lacticidven:4)  ) Recent Results (from the past 240 hour(s))  Culture, body fluid w Gram Stain-bottle     Status: None (Preliminary result)   Collection Time: 02/10/23 12:04 PM   Specimen: Pleura  Result Value Ref Range Status   Specimen Description PLEURAL  Final   Special Requests NONE  Final   Culture   Final  NO GROWTH 4 DAYS Performed at [facility], [address], [place], [place] [address]    Report Status PENDING  Incomplete  Gram stain     Status: None   Collection Time: 02/10/23 12:04 PM   Specimen: Pleura  Result Value Ref Range Status   Specimen Description PLEURAL  Final   Special Requests NONE  Final   Gram Stain   Final    MODERATE WBC PRESENT, PREDOMINANTLY MONONUCLEAR RARE SQUAMOUS EPITHELIAL CELLS PRESENT NO ORGANISMS SEEN Performed at [facility], [address] [place], [place] [address]    Report Status 02/10/2023 FINAL  Final     Radiology Studies: DG CHEST PORT 1 VIEW  Result Date: 02/14/2023 CLINICAL DATA:  Shortness of breath EXAM: PORTABLE CHEST 1 VIEW COMPARISON:  02/13/2023 FINDINGS: Cardiac shadow is enlarged but stable. Persistent increased vascular congestion is noted similar to that seen on the prior exam. Bilateral pleural effusions are noted. No pneumothorax is seen. IMPRESSION: Changes of CHF. Electronically Signed   By: Alcide Clever M.D.   On: 02/14/2023 11:10   DG CHEST PORT 1 VIEW  Result Date: 02/13/2023 CLINICAL DATA:  Shortness of breath EXAM: PORTABLE CHEST 1 VIEW COMPARISON:  Three days ago  FINDINGS: Cardiopericardial enlargement. Bilateral pleural effusion which is increased. Diffuse interstitial opacity with cephalized blood flow. Aeration has worsened from before. IMPRESSION: Worsening CHF and pleural effusions. Electronically Signed   By: Tiburcio Pea M.D.   On: 02/13/2023 04:02     Scheduled Meds:  amLODipine  10 mg Oral Daily   apixaban  5 mg Oral BID   bisacodyl  10 mg Rectal Once   Chlorhexidine Gluconate Cloth  6 each Topical Daily   cloNIDine  0.2 mg Oral BID   feeding supplement  237 mL Oral BID BM   guaiFENesin  600 mg Oral BID   hydrALAZINE  25 mg Oral TID    HYDROmorphone (DILAUDID) injection  0.5 mg Intravenous Once   ipratropium-albuterol  3 mL Nebulization BID   lidocaine  1 patch Transdermal Daily   pantoprazole  40 mg Oral Daily   polyethylene glycol  17 g Oral Daily   sodium chloride  2 g Oral BID WC   Continuous Infusions:  ferric gluconate (FERRLECIT) IVPB Stopped (02/13/23 1549)     LOS: 6 days    Time spent:    Zannie Cove, MD [facility]   02/14/2023, 11:40 AM

## 2023-02-14 NOTE — Progress Notes (Signed)
PT Cancellation Note  Patient Details Name: [name] MRN: [id number] DOB: [birth year]   Cancelled Treatment:    Reason Eval/Treat Not Completed: Patient at procedure or test/unavailable (Pt off the floor for thoracentesis. Pt had poor tolerance to OT and MS earlier. Will follow up tomorrow.)   [name] 02/14/2023, 1:44 PM

## 2023-02-14 NOTE — TOC Progression Note (Signed)
Transition of Care [facility]) - Progression Note    Patient Details  Name: [name] MRN: [id number] Date of Birth: 17-Feb-1935  Transition of Care [facility]) CM/SW Contact  Elliot Cousin, RN Phone Number: [phone] 02/14/2023, 1:56 PM  Clinical Narrative:   HF TOC CM spoke to pt's son, [name] at bedside. Offered choice for Residential Hospice/Home Hospice. States his father was at [facility] in [place], he was home first and then transferred. He prefers Hospice Home in [place]. States he has hospital bed at home. Contacted [facility] referral line with new referral. Received message via chat from from [facility] [place], states they will review referral for Home Hospice.   Expected Discharge Plan: Hospice Medical Facility Barriers to Discharge: Continued Medical Work up  Expected Discharge Plan and Services In-house Referral: NA Discharge Planning Services: CM Consult Post Acute Care Choice: Residential Hospice Bed Living arrangements for the past 2 months: Single Family Home                 DME Arranged: N/A DME Agency: NA       HH Arranged: RN, PT, Nurse's Aide HH Agency: [facility] Date HH Agency Contacted: 02/10/23 Time HH Agency Contacted: 1425 Representative spoke with at [facility] Agency: Haywood Lasso   Social Determinants of Health (SDOH) Interventions SDOH Screenings   Food Insecurity: No Food Insecurity (02/08/2023)  Housing: Low Risk  (02/08/2023)  Transportation Needs: No Transportation Needs (02/08/2023)  Utilities: Not At Risk (02/08/2023)  Alcohol Screen: Low Risk  (11/08/2022)  Depression (PHQ2-9): Low Risk  (12/16/2022)  Financial Resource Strain: Low Risk  (11/08/2022)  Recent Concern: Financial Resource Strain - Medium Risk (11/04/2022)  Physical Activity: Sufficiently Active (11/08/2022)  Recent Concern: Physical Activity - Insufficiently Active (08/11/2022)  Social Connections: Moderately Integrated (11/08/2022)  Recent Concern: Social Connections -  Moderately Isolated (08/11/2022)  Stress: No Stress Concern Present (11/08/2022)  Tobacco Use: Low Risk  (02/10/2023)    Readmission Risk Interventions    02/11/2023    1:53 PM 02/10/2023    2:19 PM  Readmission Risk Prevention Plan  Transportation Screening Complete Complete  Medication Review (RN Care Manager) Complete Complete  PCP or Specialist appointment within 3-5 days of discharge  Complete  HRI or Home Care Consult  Complete  Palliative Care Screening  Not Applicable  Skilled Nursing Facility  Not Applicable

## 2023-02-15 DIAGNOSIS — J9601 Acute respiratory failure with hypoxia: Secondary | ICD-10-CM | POA: Diagnosis not present

## 2023-02-15 DIAGNOSIS — S32050A Wedge compression fracture of fifth lumbar vertebra, initial encounter for closed fracture: Secondary | ICD-10-CM | POA: Diagnosis not present

## 2023-02-15 DIAGNOSIS — Z515 Encounter for palliative care: Secondary | ICD-10-CM | POA: Diagnosis not present

## 2023-02-15 DIAGNOSIS — I5033 Acute on chronic diastolic (congestive) heart failure: Secondary | ICD-10-CM | POA: Diagnosis not present

## 2023-02-15 LAB — BASIC METABOLIC PANEL
Anion gap: 10 (ref 5–15)
Anion gap: 13 (ref 5–15)
BUN: 67 mg/dL — ABNORMAL HIGH (ref 8–23)
BUN: 67 mg/dL — ABNORMAL HIGH (ref 8–23)
CO2: 35 mmol/L — ABNORMAL HIGH (ref 22–32)
CO2: 30 mmol/L (ref 22–32)
Calcium: 9.6 mg/dL (ref 8.9–10.3)
Calcium: 9.6 mg/dL (ref 8.9–10.3)
Chloride: 95 mmol/L — ABNORMAL LOW (ref 98–111)
Chloride: 97 mmol/L — ABNORMAL LOW (ref 98–111)
Creatinine, Ser: 1.21 mg/dL — ABNORMAL HIGH (ref 0.44–1.00)
Creatinine, Ser: 1.11 mg/dL — ABNORMAL HIGH (ref 0.44–1.00)
GFR, Estimated: 44 mL/min — ABNORMAL LOW (ref >60)
GFR, Estimated: 48 mL/min — ABNORMAL LOW (ref >60)
Glucose, Bld: 134 mg/dL — ABNORMAL HIGH (ref 70–99)
Glucose, Bld: 146 mg/dL — ABNORMAL HIGH (ref 70–99)
Potassium: 4.1 mmol/L (ref 3.5–5.1)
Potassium: 4.3 mmol/L (ref 3.5–5.1)
Sodium: 140 mmol/L (ref 135–145)
Sodium: 140 mmol/L (ref 135–145)

## 2023-02-15 LAB — PATHOLOGIST SMEAR REVIEW

## 2023-02-15 MED ORDER — HYDRALAZINE HCL 25 MG PO TABS
37.5000 mg | ORAL_TABLET | Freq: Three times a day (TID) | ORAL | Status: DC
  Administered 2023-02-15 – 2023-02-16 (×4): 37.5 mg via ORAL
  Filled 2023-02-15 (×4): qty 2

## 2023-02-15 MED ORDER — POTASSIUM CHLORIDE CRYS ER 20 MEQ PO TBCR
20.0000 meq | EXTENDED_RELEASE_TABLET | Freq: Once | ORAL | Status: AC
  Administered 2023-02-15: 20 meq via ORAL
  Filled 2023-02-15: qty 1

## 2023-02-15 MED ORDER — MORPHINE SULFATE (PF) 2 MG/ML IV SOLN
1.0000 mg | INTRAVENOUS | Status: DC | PRN
  Administered 2023-02-15 – 2023-02-16 (×7): 2 mg via INTRAVENOUS
  Filled 2023-02-15 (×7): qty 1

## 2023-02-15 NOTE — Progress Notes (Signed)
This chaplain is present for F/U spiritual care. The Pt. and son acknowledge my presence and introduce me to the visitor, the Pt. niece.   Per the Pt. request the chaplain adjusted the blankets and positioning of the Pt. lower legs on the pillow.  The Pt. shares her preference to rest and accepts the chaplain's intercessory prayer.  The chaplain is available for F/U spiritual care as needed.  Chaplain Stephanie Acre [phone]

## 2023-02-15 NOTE — TOC Progression Note (Signed)
Transition of Care [facility]) - Progression Note    Patient Details  Name: [name] MRN: [id number] Date of Birth: [birth year]  Transition of Care [facility]) CM/SW Contact  Leander Rams, LCSW Phone Number: 02/15/2023, 1:10 PM  Clinical Narrative:    CSW was informed that pt would like to dc to Hospice home CSW met with pt at bedside along nephew present. CSW discussed the transition from hospital to hospice home in [place]. Pt expresses she is ready to go home. Nephew informed CSW outside of room that pt is referring to [facility]." Nephew reports he feels pt Castner experience a hospital death and was unsure if he wants pt to dc to residential hospice today but will be open to dc tomorrow.   CSW notified [facility] hospice liaison Danford Bad that pt is agreeable to be evaluated today for hospice home.    Expected Discharge Plan: Hospice Medical Facility Barriers to Discharge: Continued Medical Work up  Expected Discharge Plan and Services In-house Referral: NA Discharge Planning Services: CM Consult Post Acute Care Choice: Residential Hospice Bed Living arrangements for the past 2 months: Single Family Home                 DME Arranged: N/A DME Agency: NA       HH Arranged: RN, PT, Nurse's Aide HH Agency: [facility] Date HH Agency Contacted: 02/10/23 Time HH Agency Contacted: 1425 Representative spoke with at [facility] Agency: Haywood Lasso   Social Determinants of Health (SDOH) Interventions SDOH Screenings   Food Insecurity: No Food Insecurity (02/08/2023)  Housing: Low Risk  (02/08/2023)  Transportation Needs: No Transportation Needs (02/08/2023)  Utilities: Not At Risk (02/08/2023)  Alcohol Screen: Low Risk  (11/08/2022)  Depression (PHQ2-9): Low Risk  (12/16/2022)  Financial Resource Strain: Low Risk  (11/08/2022)  Recent Concern: Financial Resource Strain - Medium Risk (11/04/2022)  Physical Activity: Sufficiently Active (11/08/2022)  Recent Concern: Physical Activity - Insufficiently Active  (08/11/2022)  Social Connections: Moderately Integrated (11/08/2022)  Recent Concern: Social Connections - Moderately Isolated (08/11/2022)  Stress: No Stress Concern Present (11/08/2022)  Tobacco Use: Low Risk  (02/14/2023)    Readmission Risk Interventions    02/11/2023    1:53 PM 02/10/2023    2:19 PM  Readmission Risk Prevention Plan  Transportation Screening Complete Complete  Medication Review (RN Care Manager) Complete Complete  PCP or Specialist appointment within 3-5 days of discharge  Complete  HRI or Home Care Consult  Complete  Palliative Care Screening  Not Applicable  Skilled Nursing Facility  Not Applicable   Oletta Lamas, MSW, LCSWA, LCASA Transitions of Care  Clinical Social Worker I

## 2023-02-15 NOTE — Progress Notes (Signed)
Civil engineer, contracting [facility]) Hospital Liaison Note  Referral received for patient/family interest in Hospice Home. Chart under review by [facility] physician.   Patient has been approved for Hospice Home. Plan is to transfer tomorrow pending stability.   Please call with any questions or concerns. Thank you  Dionicio Stall, [facility] Liaison [phone]

## 2023-02-15 NOTE — Plan of Care (Signed)
  Problem: Education: Goal: Knowledge of General Education information will improve Description Including pain rating scale, medication(s)/side effects and non-pharmacologic comfort measures Outcome: Progressing   

## 2023-02-15 NOTE — Progress Notes (Signed)
CCMD notified CN that patient had a 20 beat run of VTACH. CN rounded on patient, patient lying in bed with no signs of distress. CN relayed to primary RN and Alma of CHF team. Provider to orders potassium and recheck BMET lab order.

## 2023-02-15 NOTE — Progress Notes (Signed)
Orthopedic Tech Progress Note Patient Details:  [name] 09-26-1934 [id number]  Ortho Devices Type of Ortho Device: Ace wrap, Unna boot Ortho Device/Splint Location: BLE Ortho Device/Splint Interventions: Ordered, Application, Adjustment   Post Interventions Patient Tolerated: Well Instructions Provided: Care of device  [name] 02/15/2023, 5:24 PM

## 2023-02-15 NOTE — Progress Notes (Signed)
Palliative Medicine Progress Note   Patient Name: [name]       Date: 02/15/2023 DOB: [birth year]  Age: 88 y.o. MRN#: [id number] Attending Physician: Zannie Cove, MD Primary Care Physician: Donita Brooks, MD Admit Date: 02/07/2023  Reason for Consultation/Follow-up: {Reason for Consult:23484}  HPI/Patient Profile: 88 y.o. female  with past medical history of HFpEF, severe pulmonary hypertension, aortic stenosis, CAD, atrial fibrillation, chronic hyponatremia, CKD stage III, and anemia of chronic disease.  She was recently admitted to [facility] hospital 01/08/2023 through 01/17/2023 after a fall and was found to have an L5 compression fracture.  She was discharged to [facility] for rehab.  She presented to the ED 02/07/2023 with dyspnea and was admitted with acute on chronic heart failure and pleural effusion.  Status post left thoracentesis on 6/6.   Palliative Medicine was consulted for goals of care.   Subjective: Patient states she is "not too good". She reports back and rib pain, as well as shortness of breath.   Objective:  Physical Exam          Vital Signs: BP (!) 126/39 (BP Location: Right Arm)   Pulse 63   Temp 99 F (37.2 C) (Axillary)   Resp (!) 22   Ht 5\' 5"  (1.651 m)   Wt 81.5 kg   SpO2 97%   BMI 29.89 kg/m  SpO2: SpO2: 97 % O2 Device: O2 Device: Nasal Cannula O2 Flow Rate: O2 Flow Rate (L/min): 4 L/min  Intake/output summary:  Intake/Output Summary (Last 24 hours) at 02/15/2023 1530 Last data filed at 02/15/2023 1519 Gross per 24 hour  Intake 1117.1 ml  Output 3300 ml  Net -2182.9 ml    LBM: Last BM Date : 02/13/23     Palliative Assessment/Data: ***     Palliative Medicine Assessment & Plan   Assessment: Principal Problem:   Acute on chronic  diastolic heart failure (HCC) Active Problems:   GERD (gastroesophageal reflux disease)   Essential hypertension   COPD (chronic obstructive pulmonary disease) (HCC)   Coronary artery disease   Persistent atrial fibrillation (HCC)   Closed compression fracture of L5 lumbar vertebra, initial encounter (HCC)   Anemia of chronic disease   Pressure injury of skin   Heart failure (HCC)    Recommendations/Plan: ***  Goals of Care and Additional Recommendations:  Limitations on Scope of Treatment: {Recommended Scope and Preferences:21019}  Code Status:   Prognosis:  {Palliative Care Prognosis:23504}  Discharge Planning: {Palliative dispostion:23505}  Care plan was discussed with ***  Thank you for allowing the Palliative Medicine Team to assist in the care of this patient.   ***   Merry Proud, NP   Please contact Palliative Medicine Team phone at [phone] for questions and concerns.  For individual providers, please see AMION.

## 2023-02-15 NOTE — Progress Notes (Addendum)
Patient ID: Shundra Brent Steinhilber, female   DOB: 02-18-1935, 88 y.o.   MRN: [id number]     Advanced Heart Failure Rounding Note  PCP-Cardiologist: Armanda Magic, MD   Subjective:    S/p L thoracentesis 6/6 -600 ml, not enough fluid on right for thoracentesis 6/7 S/p R thoracentesis 6/10, -700 ml   Lasix gtt started and stopped yesterday around 3pm for unknown reason (discussed with day shift RN). Upon further digging it appears she got lasix as bolus. Did get metolazone 2.5 mg. -3L UOP. Weight down 8lbs.  Still feels bad but a little better this morning. Not as tachypneic.    Objective:   Weight Range: 81.5 kg Body mass index is 29.89 kg/m.   Vital Signs:   Temp:  [97.6 F (36.4 C)-99.1 F (37.3 C)] 97.7 F (36.5 C) (06/11 0739) Pulse Rate:  [66-77] 74 (06/11 0739) Resp:  [20-25] 20 (06/11 0739) BP: (136-162)/(40-51) 155/40 (06/11 0739) SpO2:  [92 %-100 %] 96 % (06/11 0739) Weight:  [81.5 kg] 81.5 kg (06/11 0534) Last BM Date : 02/13/23  Weight change: Filed Weights   02/13/23 0300 02/14/23 0513 02/15/23 0534  Weight: 81 kg 84.9 kg 81.5 kg   Intake/Output:   Intake/Output Summary (Last 24 hours) at 02/15/2023 0757 Last data filed at 02/15/2023 0258 Gross per 24 hour  Intake 877.1 ml  Output 3000 ml  Net -2122.9 ml   Physical Exam  General:  elderly, weak appearing.   HEENT: +glasses, + [place] Neck: supple. JVD ~10 cm. Carotids 2+ bilat; no bruits. No lymphadenopathy or thyromegaly appreciated. Cor: PMI nondisplaced. Regular rate & irregular rhythm. No rubs, gallops or murmurs. Lungs: shallow breaths, coarse, diminished bases Abdomen: soft, nontender, nondistended. No hepatosplenomegaly. No bruits or masses. Good bowel sounds. Extremities: no cyanosis, clubbing, rash, +1-2 BLE edema to thigh Neuro: alert & oriented x 3, cranial nerves grossly intact. moves all 4 extremities w/o difficulty. Affect pleasant.  Telemetry   A fib 60s (Personally reviewed)    EKG    No new EKG  today  Labs    CBC Recent Labs    02/13/23 0052  WBC 7.3  HGB 9.4*  HCT 30.2*  MCV 95.9  PLT 193   Basic Metabolic Panel Recent Labs    14/78/29 0922 02/15/23 0105  NA 137 140  K 4.6 4.1  CL 94* 95*  CO2 33* 35*  GLUCOSE 134* 134*  BUN 71* 67*  CREATININE 1.36* 1.21*  CALCIUM 9.5 9.6   Liver Function Tests Recent Labs    02/14/23 0922  AST 48*  ALT 41  ALKPHOS 75  BILITOT 0.7  PROT 6.0*  ALBUMIN 2.7*   No results for input(s): "LIPASE", "AMYLASE" in the last 72 hours. Cardiac Enzymes No results for input(s): "CKTOTAL", "CKMB", "CKMBINDEX", "TROPONINI" in the last 72 hours.  BNP: BNP (last 3 results) Recent Labs    11/18/22 0635 12/14/22 1246 02/07/23 1641  BNP 136.1* 117.9* 473.9*   ProBNP (last 3 results) No results for input(s): "PROBNP" in the last 8760 hours.  D-Dimer No results for input(s): "DDIMER" in the last 72 hours. Hemoglobin A1C No results for input(s): "HGBA1C" in the last 72 hours. Fasting Lipid Panel No results for input(s): "CHOL", "HDL", "LDLCALC", "TRIG", "CHOLHDL", "LDLDIRECT" in the last 72 hours. Thyroid Function Tests No results for input(s): "TSH", "T4TOTAL", "T3FREE", "THYROIDAB" in the last 72 hours.  Invalid input(s): "FREET3"  Other results:  Imaging  IR THORACENTESIS ASP PLEURAL SPACE W/IMG GUIDE  Result Date:  02/14/2023 INDICATION: 88 year old female presents with shortness of breath, previous imaging showed bilateral pleural effusion. Request for therapeutic and diagnostic thoracentesis. EXAM: ULTRASOUND GUIDED RIGHT THORACENTESIS MEDICATIONS: None. COMPLICATIONS: None immediate. PROCEDURE: An ultrasound guided thoracentesis was thoroughly discussed with the patient and questions answered. The benefits, risks, alternatives and complications were also discussed. The patient understands and wishes to proceed with the procedure. Written consent was obtained. Ultrasound was performed to localize and mark an adequate  pocket of fluid in the right chest. The area was then prepped and draped in the normal sterile fashion. 1% Lidocaine was used for local anesthesia. Under ultrasound guidance a 6 Fr Safe-T-Centesis catheter was introduced. Thoracentesis was performed. The catheter was removed and a dressing applied. FINDINGS: A total of approximately 700 mL of hazy yellow fluid was removed. Samples were sent to the laboratory as requested by the clinical team. Post procedure chest X-ray reviewed, negative for pneumothorax. IMPRESSION: Successful ultrasound guided right thoracentesis yielding 700 mL of pleural fluid. Performed by: Lawernce Ion, PA-C Electronically Signed   By: Gilmer Mor D.O.   On: 02/14/2023 16:27   DG Chest 1 View  Result Date: 02/14/2023 CLINICAL DATA:  Pleural effusion, right thoracentesis EXAM: CHEST  1 VIEW COMPARISON:  Previous studies including the examination done earlier today FINDINGS: Transverse diameter of heart is increased. There is interval decrease in pulmonary vascular congestion. Small to moderate pleural effusions are seen with interval decrease. There is small lucency in the lateral aspect of right lower lung field. There is no demonstrable apical pneumothorax. IMPRESSION: There is partial clearing of CHF and pulmonary edema. There is decrease in right pleural effusion. There is small faint lucency in the lateral aspect of right lower lung field which Davison be an artifact or suggest small loculated right pneumothorax. There is no demonstrable right apical pneumothorax. Electronically Signed   By: Ernie Avena M.D.   On: 02/14/2023 13:38   DG CHEST PORT 1 VIEW  Result Date: 02/14/2023 CLINICAL DATA:  Shortness of breath EXAM: PORTABLE CHEST 1 VIEW COMPARISON:  02/13/2023 FINDINGS: Cardiac shadow is enlarged but stable. Persistent increased vascular congestion is noted similar to that seen on the prior exam. Bilateral pleural effusions are noted. No pneumothorax is seen. IMPRESSION:  Changes of CHF. Electronically Signed   By: Alcide Clever M.D.   On: 02/14/2023 11:10    Medications:   Scheduled Medications:  amLODipine  10 mg Oral Daily   apixaban  5 mg Oral BID   bisacodyl  10 mg Rectal Once   Chlorhexidine Gluconate Cloth  6 each Topical Daily   cloNIDine  0.2 mg Oral BID   feeding supplement  237 mL Oral BID BM   guaiFENesin  600 mg Oral BID   hydrALAZINE  25 mg Oral TID    HYDROmorphone (DILAUDID) injection  0.5 mg Intravenous Once   ipratropium-albuterol  3 mL Nebulization BID   lidocaine  1 patch Transdermal Daily   pantoprazole  40 mg Oral Daily   polyethylene glycol  17 g Oral Daily   sodium chloride  2 g Oral BID WC   Infusions:  furosemide (LASIX) 200 mg in dextrose 5 % 100 mL (2 mg/mL) infusion Stopped (02/14/23 1518)   PRN Medications: acetaminophen **OR** acetaminophen, clonazePAM, famotidine, hydrALAZINE, ipratropium-albuterol, morphine injection, ondansetron **OR** ondansetron (ZOFRAN) IV, ondansetron, mouth rinse, oxyCODONE, sodium chloride  Patient Profile  88 y.o. with history of poorly controlled HTN, chronic atrial fibrillation, chronic diastolic CHF, and prior episode of Takotsubo cardiomyopathy. AHF  team asked to see for acute on chronic systolic heart failure.   Assessment/Plan  1. Acute on chronic diastolic CHF: NYHA class III symptoms, slowly progressive over time.  Mild volume overload on exam.  RHC/LHC in 4/22 showed nonobstructive CAD; mean RA 5, PA 44/10, mean PCWP 20, CI 4.89, PVR < 1 WU (pulmonary venous hypertension).  Acute on chronic diastolic CHF in setting of cutting back diuretic after recent admission. Echo this admission with EF 65-70%, mild LVH, normal RV, mild MR, moderate AS with mean gradient 16 mmHg.  CTA chest negative for PE, showed mod-large bilateral effusions.  LE edema improving. Creatinine improving 1.21 today.  - Restart lasix gtt at 20 mg / hr.  - Off spironolactone (has not tolerated in past due to  hyperkalemia.) - Unable to tolerate Jardiance due to frequent UTIs.  - she has declined Cardiomems - Had workup for cardiac amyloidosis in the past with equivocal PYP scan, would repeat PYP scan in the future. - Breathing and tachypnea improved today. Likely a poor candidate for any advanced therapies. Would focus on symptom management at this point. Continue diuresis.  - Palliative saw patient 6/8, recommended home palliative vs hospice.  2.  CAD: Nonobstructive on 4/22 cath. She has been intolerant of statins and Zetia.   She has had atypical chest pain in the past. Admission 7/23 with CP, elevated HsTnI, CT ruled out acute aortic syndrome, echo ok. Pain felt to be related to coronary microvascular dysfunction.   - No ASA given apixaban use.  - Intolerant of statins and Zetia 3. Chronic atrial fibrillation/bradycardia: She is off nodal blockade. - Continue Eliquis 5 mg bid 4. HTN: BP elevated.  - Off spironolactone and losartan with hyperkalemia - Continue amlodipine 10 mg daily - Continue Clonidine 0.2 mg BID - Increase hydralazine 25>37.5 mg TID 5. Aortic stenosis: Mild-moderate on last echo.  - mod on echo 6/4  6. Bilateral pleural effusions: CT chest 02/08/23 showed bilateral moderate to large pleural effusions with moderate to marked severity bilateral lower lobe compressive atelectasis.  S/p left thoracentesis (6/6) (transudate),  not enough fluid for right thoracentesis.  - CXR yesterday with worsening pleural effusions, will repeat today [place] need to get tapped or get pulmonology involved. For now will aggressively diurese as above.  - morphine ordered for air hunger 7. Physical Deconditioning: Recent fall with closed compression fracture of L5 vertebra. - Continue working with PT/OT 8. Hx of Hyperkalemia:  - Spironolactone stopped. Keep off.  9. AKI on CKD stage 3: SCr 1.21 - baseline 1.1-1.3 - improving with diuresis  Plan for home vs residential hospice at discharge.   Length  of Stay: 7  Alen Bleacher, NP  02/15/2023, 7:57 AM  Advanced Heart Failure Team Pager [phone] (M-F; 7a - 5p)  Please contact [facility] for night-coverage after hours (5p -7a ) and weekends on [email or web address]  Patient seen and examined with the above-signed Advanced Practice Provider and/or Housestaff. I personally reviewed laboratory data, imaging studies and relevant notes. I independently examined the patient and formulated the important aspects of the plan. I have edited the note to reflect any of my changes or salient points. I have personally discussed the plan with the patient and/or family.  Volume status and breathing improved with lasix gtt. Says breathing comes and goes. Very weak. SCR stable   General:  Elderly. Terminally-ill appearing. No resp difficulty HEENT: normal Neck: supple. JVP 6. Carotids 2+ bilat; no bruits. No lymphadenopathy or thryomegaly appreciated. Cor:  PMI nondisplaced. Regular rate & rhythm. No rubs, gallops or murmurs. Lungs: clear anteriorly with decreased effort Abdomen: soft, nontender, nondistended. No hepatosplenomegaly. No bruits or masses. Good bowel sounds. Extremities: no cyanosis, clubbing, rash, tr edema Neuro: alert & orientedx3, cranial nerves grossly intact. moves all 4 extremities w/o difficulty. Affect anxious  Volume status much improved but still very weak and terminally ill appearing. I discussed with her and her family that I cannot see anyway to get her back to a meaningful quality of life. Strongly suggest Hospice Care. Will continue diuresis for now but little left to offer.   Arvilla Meres, MD  9:35 PM

## 2023-02-15 NOTE — Progress Notes (Signed)
Physical Therapy Treatment Patient Details Name: [name] MRN: [id number] DOB: 30-Mccluskey-1936 Today's Date: 02/15/2023   History of Present Illness Pt admitted 6/4 from home, after being discharged from SNF 3 days prior, with hypoxia and edema in abdomen and LEs secondary to CHF exacerbation. 6/6 thoracentesis. Pt with hx of: lumbar compression fractures, R fib head fx treated conservatively, HOH, CHF, colon CA, breast CA, CAD, COPD, CVA, afib.    PT Comments    Pt with poor tolerance to treatment today. Pt required +2 Max A for all mobility today and was unable ambulate today. Pt appears to be declining and family discussing hospice. DC recs changed to physician recommendation. Pt will continue to follow if able.  Recommendations for follow up therapy are one component of a multi-disciplinary discharge planning process, led by the attending physician.  Recommendations Chestang be updated based on patient status, additional functional criteria and insurance authorization.  Follow Up Recommendations  Can patient physically be transported by private vehicle: No    Assistance Recommended at Discharge Intermittent Supervision/Assistance  Patient can return home with the following Two people to help with walking and/or transfers;A lot of help with bathing/dressing/bathroom;Assist for transportation;Help with stairs or ramp for entrance;Assistance with cooking/housework   Equipment Recommendations  None recommended by PT    Recommendations for Other Services       Precautions / Restrictions Precautions Precautions: Fall Precaution Comments: watch O2 Restrictions Weight Bearing Restrictions: No     Mobility  Bed Mobility Overal bed mobility: Needs Assistance Bed Mobility: Rolling, Supine to Sit, Sit to Supine Rolling: Mod assist   Supine to sit: +2 for physical assistance, Max assist Sit to supine: +2 for physical assistance, Max assist   General bed mobility comments: Assistance for  trunk elevation and BLE management. Pt repeatedly saying that she can't when asked to performed mobility.    Transfers Overall transfer level: Needs assistance Equipment used: Rolling walker (2 wheels) Transfers: Sit to/from Stand Sit to Stand: +2 physical assistance, Max assist           General transfer comment: assist to rise and steady, increased time. Pt able to rise on second attempt. pt continues to be very anxious about mobility.    Ambulation/Gait               General Gait Details: Unable today   Stairs             Wheelchair Mobility    Modified Rankin (Stroke Patients Only)       Balance Overall balance assessment: Needs assistance Sitting-balance support: Feet supported, Bilateral upper extremity supported Sitting balance-Leahy Scale: Fair     Standing balance support: Bilateral upper extremity supported Standing balance-Leahy Scale: Poor Standing balance comment: Reliant on RW                            Cognition Arousal/Alertness: Awake/alert Behavior During Therapy: Flat affect, Anxious Overall Cognitive Status: Impaired/Different from baseline Area of Impairment: Problem solving, Memory, Safety/judgement                     Memory: Decreased short-term memory   Safety/Judgement: Decreased awareness of deficits   Problem Solving: Slow processing General Comments: Pt again very anxious about mobility. Pt appears to be declining and hospice is following now.        Exercises      General Comments General comments (skin integrity, edema, etc.): VSS  on 4L      Pertinent Vitals/Pain Pain Assessment Pain Assessment: No/denies pain    Home Living                          Prior Function            PT Goals (current goals can now be found in the care plan section) Progress towards PT goals: Not progressing toward goals - comment (Pt appears to be declining)    Frequency    Min  1X/week      PT Plan Discharge plan needs to be updated    Co-evaluation              AM-PAC PT "6 Clicks" Mobility   Outcome Measure  Help needed turning from your back to your side while in a flat bed without using bedrails?: A Lot Help needed moving from lying on your back to sitting on the side of a flat bed without using bedrails?: A Lot Help needed moving to and from a bed to a chair (including a wheelchair)?: Total Help needed standing up from a chair using your arms (e.g., wheelchair or bedside chair)?: A Lot Help needed to walk in hospital room?: Total Help needed climbing 3-5 steps with a railing? : Total 6 Click Score: 9    End of Session Equipment Utilized During Treatment: Gait belt;Oxygen Activity Tolerance: Patient limited by fatigue;Patient limited by pain Patient left: in bed;with call bell/phone within reach;with bed alarm set;with family/visitor present Nurse Communication: Mobility status PT Visit Diagnosis: Unsteadiness on feet (R26.81);Muscle weakness (generalized) (M62.81);Difficulty in walking, not elsewhere classified (R26.2);Pain     Time: 5409-8119 PT Time Calculation (min) (ACUTE ONLY): 22 min  Charges:  $Therapeutic Activity: 8-22 mins                     [name], PT, DPT Acute Rehab Services [phone]    [name] 02/15/2023, 3:16 PM

## 2023-02-15 NOTE — Progress Notes (Signed)
PROGRESS NOTE    Keonda Dow Lynam  ZOX:[id number] DOB: 07/11/1935 DOA: 02/07/2023 PCP: Donita Brooks, MD   88/F w/aortic stenosis, hypertension, CAD, atrial fibrillation, pulmonary hypertension who presented with dyspnea. Reported four weeks of worsening dyspnea, orthopnea and 40 lbs weight gain.  -Recent hospitalization 5/4-5/13 for closed compression fracture L5 and closed right fibular fracture.  -admitted 6/3 w/ respiratory distress and hypoxia. -CXR w/cardiomegaly with bilateral hilar vascular congestion, bilateral increased lung markings and bilateral pleural effusions.  -Left thoracentesis 6/6 -Slowly improving with diuretics -6/8 onwards intermittent urinary retention, AKI on CKD, diuretics held -6/9, overnight with dyspnea, Lasix x 1, Foley catheter placed  Subjective: -Complains of chest pain and shortness of breath today  Assessment and Plan:  Acute on chronic diastolic heart failure (HCC) Severe pulmonary hypertension Pleural effusion Echo with EF 65 to 70%, mild LVH, RV preserved, severely elevated PA systolic pressures, severe TR, moderate aortic stenosis.  -CTA chest with moderate to large bilateral pleural effusions,  -Left thoracentesis 6/6, 600 mL drained -Right thoracentesis 6/10, 700 mL drained -Limited response to diuretics thus far, started on Lasix gtt. Yesterday -Aldactone stopped for recurrent hyperkalemia and no SGLT2i due to UTIs -Continue amlodipine, clonidine -Discussed poor prognosis with son, recommended residential hospice as they are unable to manage her at home with hospice , TOC assessing for hospice home in [place]  AKI on CKD 3a -Creatinine improving, Foley placed few days ago   Persistent atrial fibrillation (HCC) Rate controlled.  In A-fib at this time -Resumed apixaban   Coronary artery disease -stable   Essential hypertension -Improving, meds as above   Anemia of chronic disease -anemia panel with severe iron deficiency, given IV  iron   COPD (chronic obstructive pulmonary disease) (HCC) -continue bronchodilator therapy.    GERD (gastroesophageal reflux disease) Continue antiacid therapy.   Closed compression fracture of L5 lumbar vertebra, initial encounter (HCC) -pain control, PT and Ot/.   Lung nodules -needs FU  Thyroid nodules -needs FU   Pressure injury of skin Continue local care  DVT prophylaxis: Apixaban Code Status: DNR Family Communication: Discussed with niece and son at bedside Disposition Plan: Anticipate discharge to residential hospice  Consultants:    Procedures:   Antimicrobials:    Objective: Vitals:   02/15/23 0534 02/15/23 0739 02/15/23 0859 02/15/23 1130  BP: (!) 141/44 (!) 155/40  (!) 137/46  Pulse: 66 74  69  Resp: (!) 21 20  18   Temp: 97.8 F (36.6 C) 97.7 F (36.5 C)  97.6 F (36.4 C)  TempSrc: Oral Oral  Oral  SpO2: 96% 96% 96% 100%  Weight: 81.5 kg     Height:        Intake/Output Summary (Last 24 hours) at 02/15/2023 1210 Last data filed at 02/15/2023 0900 Gross per 24 hour  Intake 1117.1 ml  Output 2300 ml  Net -1182.9 ml   Filed Weights   02/13/23 0300 02/14/23 0513 02/15/23 0534  Weight: 81 kg 84.9 kg 81.5 kg    Examination:  General exam: Elderly frail female laying in bed, uncomfortable appearing, AAO x 2 HEENT: Positive JVD CVS: S1-S2, regular rhythm, systolic murmur Lungs: Improving air movement, bilateral rales Abdomen: Soft, nontender, bowel sounds present Extremities: 2+ edema, predominantly in upper thighs Psych: Flat affect   Data Reviewed:   CBC: Recent Labs  Lab 02/10/23 0127 02/11/23 0046 02/12/23 0046 02/13/23 0052  WBC 7.2 9.4 6.9 7.3  HGB 9.7* 10.7* 8.8* 9.4*  HCT 31.7* 33.7* 28.3* 30.2*  MCV  97.5 95.2 96.9 95.9  PLT 191 242 172 193   Basic Metabolic Panel: Recent Labs  Lab 02/09/23 0106 02/10/23 0127 02/12/23 0711 02/13/23 0052 02/14/23 0922 02/15/23 0105 02/15/23 1057  NA 135   < > 136 136 137 140 140   K 4.6   < > 5.0 4.7 4.6 4.1 4.3  CL 96*   < > 93* 95* 94* 95* 97*  CO2 33*   < > 31 30 33* 35* 30  GLUCOSE 112*   < > 142* 113* 134* 134* 146*  BUN 32*   < > 52* 62* 71* 67* 67*  CREATININE 1.38*   < > 1.69* 1.65* 1.36* 1.21* 1.11*  CALCIUM 8.9   < > 9.2 9.2 9.5 9.6 9.6  MG 2.1  --   --   --   --   --   --    < > = values in this interval not displayed.   GFR: Estimated Creatinine Clearance: 38.4 mL/min (A) (by C-G formula based on SCr of 1.11 mg/dL (H)). Liver Function Tests: Recent Labs  Lab 02/10/23 1556 02/14/23 0922  AST 19 48*  ALT 17 41  ALKPHOS 74 75  BILITOT 0.6 0.7  PROT 5.7* 6.0*  ALBUMIN 2.8* 2.7*   No results for input(s): "LIPASE", "AMYLASE" in the last 168 hours. No results for input(s): "AMMONIA" in the last 168 hours. Coagulation Profile: Recent Labs  Lab 02/08/23 1421  INR 1.0   Cardiac Enzymes: No results for input(s): "CKTOTAL", "CKMB", "CKMBINDEX", "TROPONINI" in the last 168 hours. BNP (last 3 results) No results for input(s): "PROBNP" in the last 8760 hours. HbA1C: No results for input(s): "HGBA1C" in the last 72 hours. CBG: Recent Labs  Lab 02/13/23 2042  GLUCAP 144*   Lipid Profile: No results for input(s): "CHOL", "HDL", "LDLCALC", "TRIG", "CHOLHDL", "LDLDIRECT" in the last 72 hours. Thyroid Function Tests: No results for input(s): "TSH", "T4TOTAL", "FREET4", "T3FREE", "THYROIDAB" in the last 72 hours. Anemia Panel: Recent Labs    02/13/23 0052  VITAMINB12 405  FOLATE 7.8  FERRITIN 132  TIBC 263  IRON 18*  RETICCTPCT 3.0   Urine analysis:    Component Value Date/Time   COLORURINE YELLOW 01/09/2023 0948   APPEARANCEUR CLEAR 01/09/2023 0948   LABSPEC 1.012 01/09/2023 0948   PHURINE 5.0 01/09/2023 0948   GLUCOSEU NEGATIVE 01/09/2023 0948   HGBUR SMALL (A) 01/09/2023 0948   BILIRUBINUR NEGATIVE 01/09/2023 0948   KETONESUR NEGATIVE 01/09/2023 0948   PROTEINUR NEGATIVE 01/09/2023 0948   UROBILINOGEN 0.2 11/04/2013 1321    NITRITE NEGATIVE 01/09/2023 0948   LEUKOCYTESUR NEGATIVE 01/09/2023 0948   Sepsis Labs: @LABRCNTIP (procalcitonin:4,lacticidven:4)  ) Recent Results (from the past 240 hour(s))  Culture, body fluid w Gram Stain-bottle     Status: None   Collection Time: 02/10/23 12:04 PM   Specimen: Pleura  Result Value Ref Range Status   Specimen Description PLEURAL  Final   Special Requests NONE  Final   Culture   Final    NO GROWTH 5 DAYS Performed at [facility], [address] [place], [place] [address]    Report Status 02/15/2023 FINAL  Final  Gram stain     Status: None   Collection Time: 02/10/23 12:04 PM   Specimen: Pleura  Result Value Ref Range Status   Specimen Description PLEURAL  Final   Special Requests NONE  Final   Gram Stain   Final    MODERATE WBC PRESENT, PREDOMINANTLY MONONUCLEAR RARE SQUAMOUS EPITHELIAL  CELLS PRESENT NO ORGANISMS SEEN Performed at [facility], [address], [place], [place] [address]    Report Status 02/10/2023 FINAL  Final     Radiology Studies: IR THORACENTESIS ASP PLEURAL SPACE W/IMG GUIDE  Result Date: 02/14/2023 INDICATION: 88 year old female presents with shortness of breath, previous imaging showed bilateral pleural effusion. Request for therapeutic and diagnostic thoracentesis. EXAM: ULTRASOUND GUIDED RIGHT THORACENTESIS MEDICATIONS: None. COMPLICATIONS: None immediate. PROCEDURE: An ultrasound guided thoracentesis was thoroughly discussed with the patient and questions answered. The benefits, risks, alternatives and complications were also discussed. The patient understands and wishes to proceed with the procedure. Written consent was obtained. Ultrasound was performed to localize and mark an adequate pocket of fluid in the right chest. The area was then prepped and draped in the normal sterile fashion. 1% Lidocaine was used for local anesthesia. Under ultrasound guidance a 6 Fr Safe-T-Centesis catheter was introduced. Thoracentesis  was performed. The catheter was removed and a dressing applied. FINDINGS: A total of approximately 700 mL of hazy yellow fluid was removed. Samples were sent to the laboratory as requested by the clinical team. Post procedure chest X-ray reviewed, negative for pneumothorax. IMPRESSION: Successful ultrasound guided right thoracentesis yielding 700 mL of pleural fluid. Performed by: Lawernce Ion, PA-C Electronically Signed   By: Gilmer Mor D.O.   On: 02/14/2023 16:27   DG Chest 1 View  Result Date: 02/14/2023 CLINICAL DATA:  Pleural effusion, right thoracentesis EXAM: CHEST  1 VIEW COMPARISON:  Previous studies including the examination done earlier today FINDINGS: Transverse diameter of heart is increased. There is interval decrease in pulmonary vascular congestion. Small to moderate pleural effusions are seen with interval decrease. There is small lucency in the lateral aspect of right lower lung field. There is no demonstrable apical pneumothorax. IMPRESSION: There is partial clearing of CHF and pulmonary edema. There is decrease in right pleural effusion. There is small faint lucency in the lateral aspect of right lower lung field which Gamino be an artifact or suggest small loculated right pneumothorax. There is no demonstrable right apical pneumothorax. Electronically Signed   By: Ernie Avena M.D.   On: 02/14/2023 13:38   DG CHEST PORT 1 VIEW  Result Date: 02/14/2023 CLINICAL DATA:  Shortness of breath EXAM: PORTABLE CHEST 1 VIEW COMPARISON:  02/13/2023 FINDINGS: Cardiac shadow is enlarged but stable. Persistent increased vascular congestion is noted similar to that seen on the prior exam. Bilateral pleural effusions are noted. No pneumothorax is seen. IMPRESSION: Changes of CHF. Electronically Signed   By: Alcide Clever M.D.   On: 02/14/2023 11:10     Scheduled Meds:  amLODipine  10 mg Oral Daily   apixaban  5 mg Oral BID   bisacodyl  10 mg Rectal Once   Chlorhexidine Gluconate Cloth  6 each  Topical Daily   cloNIDine  0.2 mg Oral BID   feeding supplement  237 mL Oral BID BM   guaiFENesin  600 mg Oral BID   hydrALAZINE  37.5 mg Oral TID    HYDROmorphone (DILAUDID) injection  0.5 mg Intravenous Once   ipratropium-albuterol  3 mL Nebulization BID   lidocaine  1 patch Transdermal Daily   pantoprazole  40 mg Oral Daily   polyethylene glycol  17 g Oral Daily   sodium chloride  2 g Oral BID WC   Continuous Infusions:  furosemide (LASIX) 200 mg in dextrose 5 % 100 mL (2 mg/mL) infusion 20 mg/hr (02/15/23 1031)     LOS: 7  days    Time spent:    Zannie Cove, MD [facility]   02/15/2023, 12:10 PM

## 2023-02-16 DIAGNOSIS — J9601 Acute respiratory failure with hypoxia: Secondary | ICD-10-CM | POA: Diagnosis not present

## 2023-02-16 DIAGNOSIS — Z515 Encounter for palliative care: Secondary | ICD-10-CM | POA: Diagnosis not present

## 2023-02-16 DIAGNOSIS — I5033 Acute on chronic diastolic (congestive) heart failure: Secondary | ICD-10-CM | POA: Diagnosis not present

## 2023-02-16 DIAGNOSIS — S32050A Wedge compression fracture of fifth lumbar vertebra, initial encounter for closed fracture: Secondary | ICD-10-CM | POA: Diagnosis not present

## 2023-02-16 LAB — BASIC METABOLIC PANEL
Anion gap: 7 (ref 5–15)
BUN: 65 mg/dL — ABNORMAL HIGH (ref 8–23)
CO2: 37 mmol/L — ABNORMAL HIGH (ref 22–32)
Calcium: 9.6 mg/dL (ref 8.9–10.3)
Chloride: 98 mmol/L (ref 98–111)
Creatinine, Ser: 1.13 mg/dL — ABNORMAL HIGH (ref 0.44–1.00)
GFR, Estimated: 47 mL/min — ABNORMAL LOW (ref >60)
Glucose, Bld: 134 mg/dL — ABNORMAL HIGH (ref 70–99)
Potassium: 4.2 mmol/L (ref 3.5–5.1)
Sodium: 142 mmol/L (ref 135–145)

## 2023-02-16 MED ORDER — TRAZODONE HCL 50 MG PO TABS: 50.0000 mg | ORAL_TABLET | Freq: Every day | ORAL | Status: DC

## 2023-02-16 MED ORDER — CLONAZEPAM 0.5 MG PO TABS
0.5000 mg | ORAL_TABLET | Freq: Two times a day (BID) | ORAL | 0 refills | Status: DC | PRN
Start: 2023-02-16 — End: 2023-03-02

## 2023-02-16 MED ORDER — TORSEMIDE 40 MG PO TABS: 80.0000 mg | ORAL_TABLET | Freq: Every day | ORAL | Status: DC

## 2023-02-16 MED ORDER — OXYCODONE HCL 5 MG PO TABS: 5.0000 mg | ORAL_TABLET | ORAL | 0 refills | Status: DC | PRN

## 2023-02-16 NOTE — Progress Notes (Signed)
Speech Language Pathology Treatment: Dysphagia  Patient Details Name: [name] MRN: [id number] DOB: 1935-09-06 Today's Date: 02/16/2023 Time: 4098-1191 SLP Time Calculation (min) (ACUTE ONLY): 16 min  Assessment / Plan / Recommendation Clinical Impression  Pt seen for f/u dysphagia tx with limited intake d/t pt endurance/partial completion of lunch tray.  Pt with improved respiratory/swallowing balance, but concern for esophageal component to swallowing.  Pt with delayed cough after larger bolus of thin liquids and requiring multiple swallows d/t globus sensation.  Hx of GERD and noted belching after most swallows.  Pt required min verbal cues to follow swallow precautions, but implemented them after instructed with safe swallow precautions.  Discussed deconditioning, decreased respiratory status and ill-fitting dentures (not available today), but pt refused any foods which required mastication d/t endurance/absence of dentures.  Recommend continue full liquids and/or progress to Dysphagia 1/thin if pt agreeable.  ST will continue to f/u in acute setting for esophageal precaution education/diet advancement/dysphagia tx/management.  HPI HPI: [name] is a 88 y.o. female with medical history significant of aortic stenosis, heart failure with preserved ejection fraction, hypertension, hyperlipidemia, CAD, permanent A-fib, pulmonary hypertension, prior stroke who presented to the emergency department due to shortness of breath. Patient states that over the last 1 month she has been having progressively worsening shortness of breath and cough. She has also gained 40lbs. She states that she has difficulty laying flat as well as being unable to walk more than a couple meters without having to stop. She called EMS due to mild respiratory distress. They found her hypoxic in the 80s and brought her to the ER for further assessment. On arrival she was bradycardic in the 50s and satting well on nasal cannula. Labs  were obtained which showed sodium 131, creatinine 1.1, WBC 6.8, hemoglobin 10.6, troponin 78, 67, BNP 473, chest x-ray showed cardiomegaly with pulmonary vascular congestion as well as opacities in the lungs and a left-sided pleural effusion. Patient was admitted for further workup and started on IV diuresis. On admission she was mild labored breathing on nasal cannula. She denied any fever or chills however has had a productive cough. She has bilateral swelling in her lower extremities and endorses weight gain. CXR on 02/10/23 indicated Decrease in left pleural effusion status post thoracentesis. No pneumothorax. 2. Persistent bilateral pleural effusions and increased interstitial markings compatible with pulmonary edema ST consulted for clinical swallowing evaluation with full liquids recommended.  ST f/u for dysphagia tx/management.      SLP Plan  Continue with current plan of care      Recommendations for follow up therapy are one component of a multi-disciplinary discharge planning process, led by the attending physician.  Recommendations Kolasinski be updated based on patient status, additional functional criteria and insurance authorization.    Recommendations  Diet recommendations: Thin liquid;Dysphagia 1 (puree) Liquids provided via: Straw Medication Administration: Whole meds with puree Supervision: Patient able to self feed;Staff to assist with self feeding;Intermittent supervision to cue for compensatory strategies Compensations: Slow rate;Small sips/bites;Multiple dry swallows after each bite/sip;Follow solids with liquid;Clear throat intermittently;Effortful swallow Postural Changes and/or Swallow Maneuvers: Seated upright 90 degrees                  Oral care BID;Staff/trained caregiver to provide oral care   Other (comment) (TBD) Dysphagia, pharyngoesophageal phase (R13.14)     Continue with current plan of care     Pat Chayce Rullo,M.S., CCC-SLP  02/16/2023, 1:58 PM

## 2023-02-16 NOTE — Progress Notes (Addendum)
Patient ID: [name], female   DOB: 11-08-1934, 88 y.o.   MRN: [id number]     Advanced Heart Failure Rounding Note  PCP-Cardiologist: Armanda Magic, MD   Subjective:    S/p L thoracentesis 6/6 -600 ml, not enough fluid on right for thoracentesis 6/7 S/p R thoracentesis 6/10, -700 ml   Remains on lasix gtt at 20/hr. 2.6L in UOP yesterday. Wt charted as down 10 lb, doubt accurate. SCr stable 1.13, K 4.2.  Remains on 4L . No respiratory distress. Awake and conversant. Main complaint is buttock being sore.   Family present at bedside.   Planning d/c to hospice.    Objective:   Weight Range: 76.7 kg Body mass index is 28.12 kg/m.   Vital Signs:   Temp:  [97.6 F (36.4 C)-99 F (37.2 C)] 97.6 F (36.4 C) (06/12 0455) Pulse Rate:  [63-69] 67 (06/12 0826) Resp:  [18-22] 20 (06/12 0826) BP: (126-147)/(39-48) 147/48 (06/12 0910) SpO2:  [96 %-100 %] 96 % (06/12 0910) FiO2 (%):  [36 %] 36 % (06/12 0826) Weight:  [76.7 kg] 76.7 kg (06/12 0500) Last BM Date : 02/14/23  Weight change: Filed Weights   02/14/23 0513 02/15/23 0534 02/16/23 0500  Weight: 84.9 kg 81.5 kg 76.7 kg   Intake/Output:   Intake/Output Summary (Last 24 hours) at 02/16/2023 0921 Last data filed at 02/16/2023 0500 Gross per 24 hour  Intake 480 ml  Output 2600 ml  Net -2120 ml   Physical Exam   General:  fatigued/ cachetic and chronically ill appearing elderly WF. No respiratory difficulty HEENT: normal Neck: supple. JVD 10 cm . Carotids 2+ bilat; no bruits. No lymphadenopathy or thyromegaly appreciated. Cor: PMI nondisplaced. Irregularly irregular rhythm, slow rate. 2/6 murmur loudest RUSB  Lungs: decreased BS at the bases  Abdomen: soft, nontender, nondistended. No hepatosplenomegaly. No bruits or masses. Good bowel sounds. Extremities: no cyanosis, clubbing, rash, trace- b/l LEE + Unna boots  Neuro: alert & oriented x 3, cranial nerves grossly intact. moves all 4 extremities w/o difficulty. Affect  pleasant. GU: + foley   Telemetry   A fib 60s (Personally reviewed)    EKG    No new EKG today  Labs    CBC No results for input(s): "WBC", "NEUTROABS", "HGB", "HCT", "MCV", "PLT" in the last 72 hours.  Basic Metabolic Panel Recent Labs    40/98/11 1057 02/16/23 0008  NA 140 142  K 4.3 4.2  CL 97* 98  CO2 30 37*  GLUCOSE 146* 134*  BUN 67* 65*  CREATININE 1.11* 1.13*  CALCIUM 9.6 9.6   Liver Function Tests Recent Labs    02/14/23 0922  AST 48*  ALT 41  ALKPHOS 75  BILITOT 0.7  PROT 6.0*  ALBUMIN 2.7*   No results for input(s): "LIPASE", "AMYLASE" in the last 72 hours. Cardiac Enzymes No results for input(s): "CKTOTAL", "CKMB", "CKMBINDEX", "TROPONINI" in the last 72 hours.  BNP: BNP (last 3 results) Recent Labs    11/18/22 0635 12/14/22 1246 02/07/23 1641  BNP 136.1* 117.9* 473.9*   ProBNP (last 3 results) No results for input(s): "PROBNP" in the last 8760 hours.  D-Dimer No results for input(s): "DDIMER" in the last 72 hours. Hemoglobin A1C No results for input(s): "HGBA1C" in the last 72 hours. Fasting Lipid Panel No results for input(s): "CHOL", "HDL", "LDLCALC", "TRIG", "CHOLHDL", "LDLDIRECT" in the last 72 hours. Thyroid Function Tests No results for input(s): "TSH", "T4TOTAL", "T3FREE", "THYROIDAB" in the last 72 hours.  Invalid input(s): "  FREET3"  Other results:  Imaging  No results found.  Medications:   Scheduled Medications:  amLODipine  10 mg Oral Daily   apixaban  5 mg Oral BID   bisacodyl  10 mg Rectal Once   Chlorhexidine Gluconate Cloth  6 each Topical Daily   cloNIDine  0.2 mg Oral BID   feeding supplement  237 mL Oral BID BM   guaiFENesin  600 mg Oral BID   hydrALAZINE  37.5 mg Oral TID    HYDROmorphone (DILAUDID) injection  0.5 mg Intravenous Once   ipratropium-albuterol  3 mL Nebulization BID   lidocaine  1 patch Transdermal Daily   pantoprazole  40 mg Oral Daily   polyethylene glycol  17 g Oral Daily   sodium  chloride  2 g Oral BID WC   Infusions:  furosemide (LASIX) 200 mg in dextrose 5 % 100 mL (2 mg/mL) infusion 20 mg/hr (02/16/23 0710)   PRN Medications: acetaminophen **OR** acetaminophen, clonazePAM, famotidine, hydrALAZINE, ipratropium-albuterol, morphine injection, ondansetron **OR** ondansetron (ZOFRAN) IV, ondansetron, mouth rinse, oxyCODONE, sodium chloride  Patient Profile  88 y.o. with history of poorly controlled HTN, chronic atrial fibrillation, chronic diastolic CHF, and prior episode of Takotsubo cardiomyopathy. AHF team asked to see for acute on chronic systolic heart failure.   Assessment/Plan  1. Acute on chronic diastolic CHF: NYHA class III symptoms, slowly progressive over time.  Mild volume overload on exam.  RHC/LHC in 4/22 showed nonobstructive CAD; mean RA 5, PA 44/10, mean PCWP 20, CI 4.89, PVR < 1 WU (pulmonary venous hypertension).  Acute on chronic diastolic CHF in setting of cutting back diuretic after recent admission. Echo this admission with EF 65-70%, mild LVH, normal RV, mild MR, moderate AS with mean gradient 16 mmHg.  CTA chest negative for PE, showed mod-large bilateral effusions.  LE edema improving w/ diuresis.  - Continue lasix gtt at 20 mg/ hr until transfer to hospice facility  - Off spironolactone (has not tolerated in past due to hyperkalemia.) - Unable to tolerate Jardiance due to frequent UTIs.  - she has declined Cardiomems - Had workup for cardiac amyloidosis in the past with equivocal PYP scan. No plans to repeat (now planning hospice) - Volume status improving w/ diuresis, but still very weak and terminally ill appearing. Cannot see anyway to get her back to a meaningful quality of life. Not a candidate for advanced therapies.  - Palliative care following, planning now for d/c to hospice care  2.  CAD: Nonobstructive on 4/22 cath. She has been intolerant of statins and Zetia.   She has had atypical chest pain in the past. Admission 7/23 with CP,  elevated HsTnI, CT ruled out acute aortic syndrome, echo ok. Pain felt to be related to coronary microvascular dysfunction.   - No ASA given apixaban use.  - Intolerant of statins and Zetia 3. Chronic atrial fibrillation/bradycardia: She is off nodal blockade. - Continue Eliquis 5 mg bid 4. HTN: moderately elevated.  - Off spironolactone and losartan with hyperkalemia - Continue amlodipine 10 mg daily - Continue Clonidine 0.2 mg BID - Continue hydralazine 37.5 mg TID 5. Aortic stenosis: Mild-moderate on last echo.  - mod on echo 6/4  6. Bilateral pleural effusions: CT chest 02/08/23 showed bilateral moderate to large pleural effusions with moderate to marked severity bilateral lower lobe compressive atelectasis.  S/p left thoracentesis (6/6) (transudate),  not enough fluid for right thoracentesis.  -  For now continue to diurese as above.  - morphine ordered for  air hunger 7. Physical Deconditioning: Recent fall with closed compression fracture of L5 vertebra. 8. Hx of Hyperkalemia:  - resolved, K 4.2 today  - Spironolactone stopped. Keep off.  9. AKI on CKD stage 3: SCr 1.21 - baseline 1.1-1.3 - improving with diuresis  Plan transition to  residential hospice care at d/c. Family to finalize paperwork later today. Will d/c once space is available.   Length of Stay: [address], PA-C  02/16/2023, 9:21 AM  Advanced Heart Failure Team Pager [phone] (M-F; 7a - 5p)  Please contact [facility] for night-coverage after hours (5p -7a ) and weekends on [email or web address]  Patient seen and examined with the above-signed Advanced Practice Provider and/or Housestaff. I personally reviewed laboratory data, imaging studies and relevant notes. I independently examined the patient and formulated the important aspects of the plan. I have edited the note to reflect any of my changes or salient points. I have personally discussed the plan with the patient and/or family.  She is terminally ill.  Discharging today with Hospice.   I spoke with Dr. Jomarie Longs.   Would send out on torsemide 80 daily -> can increase to 80 bid AS NEEDED for worsening volume overload.   Arvilla Meres, MD  12:12 PM

## 2023-02-16 NOTE — Progress Notes (Signed)
OT Cancellation Note and Discharge  Patient Details Name: [name] MRN: [id number] DOB: 1935/08/22   Cancelled Treatment:    Reason Eval/Treat Not Completed:  Per chart, plan is for discharge to residential hospice. OT signing off.  Tommy, Minichiello 02/16/2023, 8:59 AM Berna Spare, OTR/L Acute Rehabilitation Services Office: [phone]

## 2023-02-16 NOTE — TOC Transition Note (Signed)
Transition of Care [facility]) - CM/SW Discharge Note   Patient Details  Name: [name] MRN: [id number] Date of Birth: [birth year]  Transition of Care [facility]) CM/SW Contact:  Leander Rams, LCSW Phone Number: 02/16/2023, 1:24 PM   Clinical Narrative:    Patient will DC to: [facility] in [place]  Anticipated DC date: 02/16/2023 Family notified: [name]  Transport by: Sharin Mons   Per MD patient ready for DC to [facility] in [place]. RN, patient, patient's family, and facility notified of DC. Discharge Summary and FL2 sent to facility. RN to call report prior to discharge [phone]. DC packet on chart. Ambulance transport requested for patient.   CSW will sign off for now as social work intervention is no longer needed. Please consult [place] again if new needs arise.    Final next level of care: Hospice Medical Facility Barriers to Discharge: No Barriers Identified   Patient Goals and CMS Choice CMS Medicare.gov Compare Post Acute Care list provided to:: Patient Represenative (must comment) [name] Weber) Choice offered to / list presented to : Adult Children  Discharge Placement                Patient chooses bed at:  [facility] [place]) Patient to be transferred to facility by: [facility] Name of family member notified: [name] Patient and family notified of of transfer: 02/16/23  Discharge Plan and Services Additional resources added to the After Visit Summary for   In-house Referral: NA Discharge Planning Services: CM Consult Post Acute Care Choice: Residential Hospice Bed          DME Arranged: N/A DME Agency: NA       HH Arranged: RN, PT, Nurse's Aide HH Agency: [facility] Date HH Agency Contacted: 02/10/23 Time HH Agency Contacted: 1425 Representative spoke with at [facility] Agency: Haywood Lasso  Social Determinants of Health (SDOH) Interventions SDOH Screenings   Food Insecurity: No Food Insecurity (02/08/2023)  Housing: Low Risk  (02/08/2023)  Transportation  Needs: No Transportation Needs (02/08/2023)  Utilities: Not At Risk (02/08/2023)  Alcohol Screen: Low Risk  (11/08/2022)  Depression (PHQ2-9): Low Risk  (12/16/2022)  Financial Resource Strain: Low Risk  (11/08/2022)  Recent Concern: Financial Resource Strain - Medium Risk (11/04/2022)  Physical Activity: Sufficiently Active (11/08/2022)  Recent Concern: Physical Activity - Insufficiently Active (08/11/2022)  Social Connections: Moderately Integrated (11/08/2022)  Recent Concern: Social Connections - Moderately Isolated (08/11/2022)  Stress: No Stress Concern Present (11/08/2022)  Tobacco Use: Low Risk  (02/14/2023)     Readmission Risk Interventions    02/11/2023    1:53 PM 02/10/2023    2:19 PM  Readmission Risk Prevention Plan  Transportation Screening Complete Complete  Medication Review (RN Care Manager) Complete Complete  PCP or Specialist appointment within 3-5 days of discharge  Complete  HRI or Home Care Consult  Complete  Palliative Care Screening  Not Applicable  Skilled Nursing Facility  Not Applicable    Oletta Lamas, MSW, LCSWA, LCASA Transitions of Care  Clinical Social Worker I

## 2023-02-16 NOTE — Progress Notes (Signed)
Civil engineer, contracting [facility]) Hospital Liaison Note  Bed offered and accepted for transfer today to [facility]osp[facility]. Unit RN please call report to [phone] prior to patient leaving the unit. Please send signed DNR and paperwork with patient.   Please leave all IV access and ports in place.   Please call with any questions or concerns. Thank you  Dionicio Stall, [facility] Liaison [phone]

## 2023-02-16 NOTE — Discharge Summary (Addendum)
Physician Discharge Summary  [name] ZOX:[id number] DOB: 11-Nov-1934 DOA: 02/07/2023  PCP: Donita Brooks, MD  Admit date: 02/07/2023 Discharge date: 02/16/2023  Time spent: 45 minutes  Recommendations for Outpatient Follow-up:  Residential hospice for comfort focused care   Discharge Diagnoses:  Principal Problem:   Acute on chronic diastolic heart failure (HCC) Severe pulmonary hypertension Anasarca AKI on CKD 3b Adult failure to thrive   Persistent atrial fibrillation (HCC)   Coronary artery disease   Essential hypertension   Anemia of chronic disease   GERD (gastroesophageal reflux disease)   COPD (chronic obstructive pulmonary disease) (HCC)   Closed compression fracture of L5 lumbar vertebra, initial encounter (HCC)   Pressure injury of skin   Heart failure (HCC)   Discharge Condition: Guarded  Diet recommendation: Comfort feeds  Filed Weights   02/14/23 0513 02/15/23 0534 02/16/23 0500  Weight: 84.9 kg 81.5 kg 76.7 kg    History of present illness:  88/F w/aortic stenosis, hypertension, CAD, atrial fibrillation, pulmonary hypertension who presented with dyspnea. Reported four weeks of worsening dyspnea, orthopnea and 40 lbs weight gain.  -Recent hospitalization 5/4-5/13 for closed compression fracture L5 and closed right fibular fracture.  -admitted 6/3 w/ respiratory distress and hypoxia. -CXR w/cardiomegaly with bilateral hilar vascular congestion, bilateral increased lung markings and bilateral pleural effusions.   Hospital Course:   Acute on chronic diastolic heart failure (HCC) Severe pulmonary hypertension Pleural effusion Echo with EF 65 to 70%, mild LVH, RV preserved, severely elevated PA systolic pressures, severe TR, moderate aortic stenosis.  -CTA chest with moderate to large bilateral pleural effusions,  -Left thoracentesis 6/6, 600 mL drained -Right thoracentesis 6/10, 700 mL drained -Limited response to diuretics thus far, started on Lasix  gtt.  -Aldactone stopped for recurrent hyperkalemia and no SGLT2i due to UTIs -changed to Po torsemide at DC, 80mg  daily can increase to 80mg  BID if needed for dyspnea, swelling etc -Overall status felt to be poor, palliative care and hospice was recommended by his myself as well as cardiology team, palliative meeting completed, she will be discharged to residential hospice for comfort focused care   AKI on CKD 3a -Creatinine improving, Foley placed few days ago   Persistent atrial fibrillation (HCC) Rate controlled.  In A-fib at this time -Resumed apixaban   Coronary artery disease -stable   Essential hypertension   Anemia of chronic disease -anemia panel with severe iron deficiency, given IV iron   COPD (chronic obstructive pulmonary disease) (HCC)   GERD (gastroesophageal reflux disease) Continue antiacid therapy.   Closed compression fracture of L5 lumbar vertebra, initial encounter (HCC)   Lung nodules    Thyroid nodules    Pressure injury of skin Continue local care   Code Status: DNR  Discharge Exam: Vitals:   02/16/23 0826 02/16/23 0910  BP:  (!) 147/48  Pulse: 67 70  Resp: 20 20  Temp:  97.7 F (36.5 C)  SpO2: 100% 96%  General exam: Elderly frail female laying in bed, uncomfortable appearing, AAO x 2 HEENT: Positive JVD CVS: S1-S2, regular rhythm, systolic murmur Lungs: Improving air movement, bilateral rales Abdomen: Soft, nontender, bowel sounds present Extremities: 2+ edema, predominantly in upper thighs Psych: Flat affect     Discharge Instructions   Discharge Instructions     Diet general   Complete by: As directed    Discharge wound care:   Complete by: As directed    Routine   Increase activity slowly   Complete by: As directed  Allergies as of 02/16/2023       Reactions   Contrast Media [iodinated Contrast Media] Hives, Other (See Comments)   Chest pain and burning   Clonidine Derivatives Other (See Comments)   Dry  mouth, throat   Statins Other (See Comments)   Myalgias   Sulfa Antibiotics Diarrhea   Tremors    Celebrex [celecoxib] Rash   Isordil [isosorbide Nitrate] Itching, Rash   Tape Itching, Rash   Redness at application site Reaction is to paper and plastic tapes and EKG leads        Medication List     STOP taking these medications    amLODipine 5 MG tablet Commonly known as: NORVASC   cloNIDine 0.2 MG tablet Commonly known as: CATAPRES   doxycycline 100 MG capsule Commonly known as: MONODOX   ferrous sulfate 325 (65 FE) MG tablet   oxyCODONE-acetaminophen 5-325 MG tablet Commonly known as: PERCOCET/ROXICET   pantoprazole 40 MG tablet Commonly known as: PROTONIX       TAKE these medications    acetaminophen 325 MG tablet Commonly known as: TYLENOL Take 2 tablets (650 mg total) by mouth every 6 (six) hours as needed for moderate pain.   albuterol 108 (90 Base) MCG/ACT inhaler Commonly known as: VENTOLIN HFA Inhale 2 puffs into the lungs every 6 (six) hours as needed for wheezing or shortness of breath.   apixaban 5 MG Tabs tablet Commonly known as: ELIQUIS Take 1 tablet (5 mg total) by mouth 2 (two) times daily.   bisacodyl 10 MG suppository Commonly known as: DULCOLAX Place 1 suppository (10 mg total) rectally as needed for moderate constipation.   budesonide-formoterol 80-4.5 MCG/ACT inhaler Commonly known as: Symbicort Inhale 2 puffs into the lungs in the morning and at bedtime. What changed:  when to take this additional instructions   clonazePAM 0.5 MG tablet Commonly known as: KLONOPIN Take 1 tablet (0.5 mg total) by mouth 2 (two) times daily as needed. for anxiety What changed:  when to take this reasons to take this   famotidine 20 MG tablet Commonly known as: PEPCID Take 20 mg by mouth daily as needed for heartburn.   fluticasone 50 MCG/ACT nasal spray Commonly known as: FLONASE Place 1 spray into both nostrils daily as needed for  allergies or rhinitis.   methocarbamol 500 MG tablet Commonly known as: ROBAXIN Take 1 tablet (500 mg total) by mouth every 6 (six) hours as needed for muscle spasms.   nitroGLYCERIN 0.4 MG SL tablet Commonly known as: NITROSTAT DISSOLVE ONE TABLET UNDER THE TONGUE EVERY 5 MINUTES AS NEEDED FOR CHEST PAIN.  DO NOT EXCEED A TOTAL OF 3 DOSES IN 15 MINUTES What changed:  how much to take how to take this when to take this   ondansetron 4 MG disintegrating tablet Commonly known as: ZOFRAN-ODT Take 1 tablet (4 mg total) by mouth every 8 (eight) hours as needed for nausea or vomiting.   oxyCODONE 5 MG immediate release tablet Commonly known as: Oxy IR/ROXICODONE Take 1 tablet (5 mg total) by mouth every 4 (four) hours as needed for moderate pain.   polyethylene glycol powder 17 GM/SCOOP powder Commonly known as: MiraLax Take 17 g by mouth 2 (two) times daily as needed for mild constipation.   ranolazine 500 MG 12 hr tablet Commonly known as: RANEXA Take 500 mg by mouth daily as needed (chest pain).   senna-docusate 8.6-50 MG tablet Commonly known as: Senokot-S Take 2 tablets by mouth 2 (two) times daily  between meals as needed for moderate constipation. What changed: when to take this   sodium chloride 1 g tablet Take 2 tablets (2 g total) by mouth 2 (two) times daily with a meal.   Torsemide 40 MG Tabs Take 80 mg by mouth daily. What changed:  how much to take Another medication with the same name was removed. Continue taking this medication, and follow the directions you see here.   traZODone 50 MG tablet Commonly known as: DESYREL Take 1 tablet (50 mg total) by mouth at bedtime.   Vitamin D (Ergocalciferol) 1.25 MG (50000 UNIT) Caps capsule Commonly known as: DRISDOL Take 1 capsule (50,000 Units total) by mouth every 7 (seven) days.               Discharge Care Instructions  (From admission, onward)           Start     Ordered   02/16/23 0000   Discharge wound care:       Comments: Routine   02/16/23 1042           Allergies  Allergen Reactions   Contrast Media [Iodinated Contrast Media] Hives and Other (See Comments)    Chest pain and burning    Clonidine Derivatives Other (See Comments)    Dry mouth, throat   Statins Other (See Comments)    Myalgias   Sulfa Antibiotics Diarrhea    Tremors    Celebrex [Celecoxib] Rash   Isordil [Isosorbide Nitrate] Itching and Rash   Tape Itching and Rash    Redness at application site Reaction is to paper and plastic tapes and EKG leads    Follow-up Information     [facility] Follow up on 02/21/2023.   Specialty: Cardiology Why: Follow up in the [facility] 02/21/23 at 2pm Entrance C, free valet Contact information: [address] [id number] [id number] [address] [phone]        Donita Brooks, MD Follow up.   Specialty: Family Medicine Why: GO: JUNE 18 AT 2:15PM Contact information: [address] [place] [address] [phone]         [facility] Follow up.   Specialty: Hospice and Palliative Medicine Contact information: [address] [place] [address] [phone]                 The results of significant diagnostics from this hospitalization (including imaging, microbiology, ancillary and laboratory) are listed below for reference.    Significant Diagnostic Studies: IR THORACENTESIS ASP PLEURAL SPACE W/IMG GUIDE  Result Date: 02/14/2023 INDICATION: 88 year old female presents with shortness of breath, previous imaging showed bilateral pleural effusion. Request for therapeutic and diagnostic thoracentesis. EXAM: ULTRASOUND GUIDED RIGHT THORACENTESIS MEDICATIONS: None. COMPLICATIONS: None immediate. PROCEDURE: An ultrasound guided thoracentesis was thoroughly discussed with the patient and questions answered. The benefits,  risks, alternatives and complications were also discussed. The patient understands and wishes to proceed with the procedure. Written consent was obtained. Ultrasound was performed to localize and mark an adequate pocket of fluid in the right chest. The area was then prepped and draped in the normal sterile fashion. 1% Lidocaine was used for local anesthesia. Under ultrasound guidance a 6 Fr Safe-T-Centesis catheter was introduced. Thoracentesis was performed. The catheter was removed and a dressing applied. FINDINGS: A total of approximately 700 mL of hazy yellow fluid was removed. Samples were sent to the laboratory as requested by the clinical team.  Post procedure chest X-ray reviewed, negative for pneumothorax. IMPRESSION: Successful ultrasound guided right thoracentesis yielding 700 mL of pleural fluid. Performed by: Lawernce Ion, PA-C Electronically Signed   By: Gilmer Mor D.O.   On: 02/14/2023 16:27   DG Chest 1 View  Result Date: 02/14/2023 CLINICAL DATA:  Pleural effusion, right thoracentesis EXAM: CHEST  1 VIEW COMPARISON:  Previous studies including the examination done earlier today FINDINGS: Transverse diameter of heart is increased. There is interval decrease in pulmonary vascular congestion. Small to moderate pleural effusions are seen with interval decrease. There is small lucency in the lateral aspect of right lower lung field. There is no demonstrable apical pneumothorax. IMPRESSION: There is partial clearing of CHF and pulmonary edema. There is decrease in right pleural effusion. There is small faint lucency in the lateral aspect of right lower lung field which [place] be an artifact or suggest small loculated right pneumothorax. There is no demonstrable right apical pneumothorax. Electronically Signed   By: Ernie Avena M.D.   On: 02/14/2023 13:38   DG CHEST PORT 1 VIEW  Result Date: 02/14/2023 CLINICAL DATA:  Shortness of breath EXAM: PORTABLE CHEST 1 VIEW COMPARISON:  02/13/2023  FINDINGS: Cardiac shadow is enlarged but stable. Persistent increased vascular congestion is noted similar to that seen on the prior exam. Bilateral pleural effusions are noted. No pneumothorax is seen. IMPRESSION: Changes of CHF. Electronically Signed   By: Alcide Clever M.D.   On: 02/14/2023 11:10   DG CHEST PORT 1 VIEW  Result Date: 02/13/2023 CLINICAL DATA:  Shortness of breath EXAM: PORTABLE CHEST 1 VIEW COMPARISON:  Three days ago FINDINGS: Cardiopericardial enlargement. Bilateral pleural effusion which is increased. Diffuse interstitial opacity with cephalized blood flow. Aeration has worsened from before. IMPRESSION: Worsening CHF and pleural effusions. Electronically Signed   By: Tiburcio Pea M.D.   On: 02/13/2023 04:02   IR ABDOMEN US LIMITED  Result Date: 02/11/2023 CLINICAL DATA:  88 year old female referred for possible thoracentesis EXAM: LIMITED CHEST ULTRASOUND FOR PLEURAL FLUID TECHNIQUE: Limited ultrasound survey for pleural effusion was formed. COMPARISON:  CT 02/08/2023 FINDINGS: Ultrasound of the right chest demonstrates small volume pleural effusion without safe window for access. IMPRESSION: Small volume right pleural fluid. Electronically Signed   By: Gilmer Mor D.O.   On: 02/11/2023 09:32   IR THORACENTESIS ASP PLEURAL SPACE W/IMG GUIDE  Result Date: 02/10/2023 INDICATION: Dyspnea with fluid overload and bilateral pleural effusions. Request for diagnostic and therapeutic thoracentesis. EXAM: ULTRASOUND GUIDED LEFT THORACENTESIS MEDICATIONS: 1% lidocaine 10 mL COMPLICATIONS: None immediate. PROCEDURE: An ultrasound guided thoracentesis was thoroughly discussed with the patient and questions answered. The benefits, risks, alternatives and complications were also discussed. The patient understands and wishes to proceed with the procedure. Written consent was obtained. Ultrasound was performed to localize and mark an adequate pocket of fluid in the left chest. The area was then  prepped and draped in the normal sterile fashion. 1% Lidocaine was used for local anesthesia. Under ultrasound guidance a 6 Fr Safe-T-Centesis catheter was introduced. Thoracentesis was performed. The catheter was removed and a dressing applied. FINDINGS: A total of approximately 600 mL of clear yellow fluid was removed. Samples were sent to the laboratory as requested by the clinical team. IMPRESSION: Successful ultrasound guided left thoracentesis yielding 600 mL of pleural fluid. No pneumothorax on post-procedure chest x-ray. Procedure performed by: Corrin Parker, PA-C Electronically Signed   By: Marliss Coots M.D.   On: 02/10/2023 14:01   DG Chest 1 View  Result Date: 02/10/2023 CLINICAL DATA:  Shortness of breath.  Status post thoracentesis EXAM: CHEST  1 VIEW COMPARISON:  02/07/2023 FINDINGS: Stable cardiomediastinal contours. Bilateral pleural effusions are again noted. Compared with the previous exam the left pleural effusion appears decreased in the interval. No significant pneumothorax status post thoracentesis. Mild diffuse increase interstitial markings. Decreased aeration to both lung bases compatible with atelectasis. IMPRESSION: 1. Decrease in left pleural effusion status post thoracentesis. No pneumothorax. 2. Persistent bilateral pleural effusions and increased interstitial markings compatible with pulmonary edema. Electronically Signed   By: Signa Kell M.D.   On: 02/10/2023 12:21   ECHOCARDIOGRAM COMPLETE  Result Date: 02/08/2023    ECHOCARDIOGRAM REPORT   Patient Name:   [name] Schlotzhauer Date of Exam: 02/08/2023 Medical Rec #:  [id number]  Height:       65.0 in Accession #:    [id number] Weight:       188.7 lb Date of Birth:  10-22-1934   BSA:          1.930 m Patient Age:    88 years   BP:           144/54 mmHg Patient Gender: F          HR:           67 bpm. Exam Location:  Inpatient Procedure: 2D Echo, Cardiac Doppler and Color Doppler Indications:    CHF - Acute Diastolic  History:         Patient has prior history of Echocardiogram examinations, most                 recent 03/17/2022. CHF and Hx of Takotsubo, CAD, COPD, Pulmonary                 HTN, TIA and Hx of cancer, CKD, Arrythmias:Atrial Fibrillation,                 Signs/Symptoms:Dyspnea, Edema and Shortness of Breath; Risk                 Factors:Hypertension and Dyslipidemia.  Sonographer:    Wallie Char Referring Phys: [id number] ROBERT DORRELL IMPRESSIONS  1. Left ventricular ejection fraction, by estimation, is 65 to 70%. The left ventricle has normal function. The left ventricle has no regional wall motion abnormalities. There is mild concentric left ventricular hypertrophy. Left ventricular diastolic function could not be evaluated.  2. Right ventricular systolic function is normal. The right ventricular size is normal. There is severely elevated pulmonary artery systolic pressure.  3. Left atrial size was severely dilated.  4. Right atrial size was severely dilated.  5. The mitral valve is normal in structure. Mild mitral valve regurgitation. No evidence of mitral stenosis. Moderate mitral annular calcification.  6. Tricuspid valve regurgitation is severe.  7. The aortic valve is normal in structure. There is severe calcifcation of the aortic valve. There is moderate thickening of the aortic valve. Aortic valve regurgitation is not visualized. Moderate aortic valve stenosis. Aortic valve mean gradient measures 15.8 mmHg. Aortic valve Vmax measures 2.59 m/s.  8. Aortic dilatation noted. There is borderline dilatation of the ascending aorta, measuring 38 mm.  9. The inferior vena cava is dilated in size with >50% respiratory variability, suggesting right atrial pressure of 8 mmHg. FINDINGS  Left Ventricle: Left ventricular ejection fraction, by estimation, is 65 to 70%. The left ventricle has normal function. The left ventricle has no regional wall motion abnormalities. The left ventricular internal cavity size was  normal in size. There  is  mild concentric left ventricular hypertrophy. Left ventricular diastolic function could not be evaluated due to atrial fibrillation. Left ventricular diastolic function could not be evaluated. Right Ventricle: The right ventricular size is normal. No increase in right ventricular wall thickness. Right ventricular systolic function is normal. There is severely elevated pulmonary artery systolic pressure. The tricuspid regurgitant velocity is 4.54 m/s, and with an assumed right atrial pressure of 12 mmHg, the estimated right ventricular systolic pressure is 94.4 mmHg. Left Atrium: Left atrial size was severely dilated. Right Atrium: Right atrial size was severely dilated. Pericardium: There is no evidence of pericardial effusion. Mitral Valve: The mitral valve is normal in structure. Moderate mitral annular calcification. Mild mitral valve regurgitation. No evidence of mitral valve stenosis. MV peak gradient, 11.3 mmHg. The mean mitral valve gradient is 2.0 mmHg. Tricuspid Valve: The tricuspid valve is normal in structure. Tricuspid valve regurgitation is severe. No evidence of tricuspid stenosis. Aortic Valve: The aortic valve is normal in structure. There is severe calcifcation of the aortic valve. There is moderate thickening of the aortic valve. Aortic valve regurgitation is not visualized. Moderate aortic stenosis is present. Aortic valve mean gradient measures 15.8 mmHg. Aortic valve peak gradient measures 26.8 mmHg. Aortic valve area, by VTI measures 1.10 cm. Pulmonic Valve: The pulmonic valve was normal in structure. Pulmonic valve regurgitation is mild. No evidence of pulmonic stenosis. Aorta: Aortic dilatation noted. There is borderline dilatation of the ascending aorta, measuring 38 mm. Venous: The inferior vena cava is dilated in size with greater than 50% respiratory variability, suggesting right atrial pressure of 8 mmHg. IAS/Shunts: No atrial level shunt detected by color flow Doppler.  LEFT  VENTRICLE PLAX 2D LVIDd:         4.30 cm     Diastology LVIDs:         2.90 cm     LV e' medial:    5.49 cm/s LV PW:         0.90 cm     LV E/e' medial:  29.1 LV IVS:        1.20 cm     LV e' lateral:   4.41 cm/s LVOT diam:     1.60 cm     LV E/e' lateral: 36.3 LV SV:         64 LV SV Index:   33 LVOT Area:     2.01 cm  LV Volumes (MOD) LV vol d, MOD A2C: 85.0 ml LV vol d, MOD A4C: 63.8 ml LV vol s, MOD A2C: 26.3 ml LV vol s, MOD A4C: 22.2 ml LV SV MOD A2C:     58.7 ml LV SV MOD A4C:     63.8 ml LV SV MOD BP:      52.3 ml RIGHT VENTRICLE            IVC RV Basal diam:  4.40 cm    IVC diam: 2.60 cm RV S prime:     8.36 cm/s TAPSE (M-mode): 1.4 cm LEFT ATRIUM             Index        RIGHT ATRIUM           Index LA diam:        4.80 cm 2.49 cm/m   RA Area:     28.10 cm LA Vol (A2C):   98.4 ml 50.99 ml/m  RA Volume:   90.40 ml  46.84 ml/m LA  Vol (A4C):   91.4 ml 47.36 ml/m LA Biplane Vol: 95.3 ml 49.38 ml/m  AORTIC VALVE AV Area (Vmax):    1.11 cm AV Area (Vmean):   1.06 cm AV Area (VTI):     1.10 cm AV Vmax:           259.00 cm/s AV Vmean:          188.000 cm/s AV VTI:            0.587 m AV Peak Grad:      26.8 mmHg AV Mean Grad:      15.8 mmHg LVOT Vmax:         143.50 cm/s LVOT Vmean:        99.550 cm/s LVOT VTI:          0.320 m LVOT/AV VTI ratio: 0.55  AORTA Ao Root diam: 3.70 cm Ao Asc diam:  3.80 cm MITRAL VALVE                TRICUSPID VALVE MV Area (PHT): 4.41 cm     TR Peak grad:   82.4 mmHg MV Area VTI:   1.58 cm     TR Mean grad:   52.0 mmHg MV Peak grad:  11.3 mmHg    TR Vmax:        454.00 cm/s MV Mean grad:  2.0 mmHg     TR Vmean:       341.0 cm/s MV Vmax:       1.68 m/s MV Vmean:      66.7 cm/s    SHUNTS MV Decel Time: 172 msec     Systemic VTI:  0.32 m MV E velocity: 160.00 cm/s  Systemic Diam: 1.60 cm Arvilla Meres MD Electronically signed by Arvilla Meres MD Signature Date/Time: 02/08/2023/9:34:27 AM    Final    CT Angio Chest Pulmonary Embolism (PE) W or WO Contrast  Result Date:  02/08/2023 CLINICAL DATA:  Shortness of breath. EXAM: CT ANGIOGRAPHY CHEST WITH CONTRAST TECHNIQUE: Multidetector CT imaging of the chest was performed using the standard protocol during bolus administration of intravenous contrast. Multiplanar CT image reconstructions and MIPs were obtained to evaluate the vascular anatomy. RADIATION DOSE REDUCTION: This exam was performed according to the departmental dose-optimization program which includes automated exposure control, adjustment of the mA and/or kV according to patient size and/or use of iterative reconstruction technique. CONTRAST:  75mL OMNIPAQUE IOHEXOL 350 MG/ML SOLN COMPARISON:  March 17, 2022 FINDINGS: Cardiovascular: There is marked severity calcification of the thoracic aorta. The ascending thoracic aorta measures 4.1 cm in diameter. Satisfactory opacification of the pulmonary arteries to the segmental level. No evidence of pulmonary embolism. There is mild cardiomegaly with moderate to marked severity coronary artery calcification. No pericardial effusion. Mediastinum/Nodes: No enlarged mediastinal, hilar, or axillary lymph nodes. A stable 2.8 cm x 2.0 cm heterogeneous thyroid nodule is seen within the inferior aspect of the left lobe of the thyroid gland. A smaller thyroid nodule is seen within the right lobe of the thyroid gland. The trachea and esophagus demonstrate no significant findings. Lungs/Pleura: Mild to moderate severity diffuse interstitial thickening is seen. A 5 mm calcified lung nodule is seen within the anterolateral aspect of the right upper lobe. Moderate to marked severity compressive atelectasis is noted within the bilateral lower lobes. Mild to moderate severity biapical scarring and/or atelectasis is also seen. A predominantly stable 8 mm x 9 mm lung nodule seen within the anteromedial aspect of the right middle lobe. There  are bilateral moderate to large pleural effusions. No pneumothorax is identified. Upper Abdomen: There is a  small hiatal hernia. Numerous punctate calcified granulomas are seen scattered throughout the spleen. Musculoskeletal: Multilevel degenerative changes seen throughout the thoracic spine. Prior vertebroplasty is noted within the upper lumbar spine. Review of the MIP images confirms the above findings. IMPRESSION: 1. No evidence of pulmonary embolism. 2. Mild to moderate severity diffuse interstitial thickening which Ardolino represent mild pulmonary edema. 3. Bilateral moderate to large pleural effusions with moderate to marked severity bilateral lower lobe compressive atelectasis. 4. Stable bilateral thyroid nodules. Correlation with follow-up nonemergent thyroid ultrasound is recommended. 5. Predominantly stable anteromedial right middle lobe lung nodule. Consider one of the following in 3 months for both low-risk and high-risk individuals: (a) repeat chest CT, (b) follow-up PET-CT, or (c) tissue sampling. This recommendation follows the consensus statement: Guidelines for Management of Incidental Pulmonary Nodules Detected on CT Images: From the Fleischner Society 2017; Radiology 2017; 284:228-243. 6. Small hiatal hernia. 7. Aortic atherosclerosis. Aortic Atherosclerosis (ICD10-I70.0). Electronically Signed   By: Aram Candela M.D.   On: 02/08/2023 03:19   DG Chest Port 1 View  Result Date: 02/07/2023 CLINICAL DATA:  Cough EXAM: PORTABLE CHEST 1 VIEW COMPARISON:  Chest x-ray 11/18/2022 FINDINGS: Patient is rotated. Heart is enlarged. There central pulmonary vascular congestion with additional interstitial opacities in the left lower lung. There is a small left pleural effusion. There is no pneumothorax or acute fracture. IMPRESSION: 1. Cardiomegaly with central pulmonary vascular congestion. 2. Interstitial opacities in the left lower lung Chopra reflect asymmetric edema or infection. 3. Small left pleural effusion. Electronically Signed   By: Darliss Cheney M.D.   On: 02/07/2023 17:20    Microbiology: Recent  Results (from the past 240 hour(s))  Culture, body fluid w Gram Stain-bottle     Status: None   Collection Time: 02/10/23 12:04 PM   Specimen: Pleura  Result Value Ref Range Status   Specimen Description PLEURAL  Final   Special Requests NONE  Final   Culture   Final    NO GROWTH 5 DAYS Performed at [facility], [address] [place], [place] [address]    Report Status 02/15/2023 FINAL  Final  Gram stain     Status: None   Collection Time: 02/10/23 12:04 PM   Specimen: Pleura  Result Value Ref Range Status   Specimen Description PLEURAL  Final   Special Requests NONE  Final   Gram Stain   Final    MODERATE WBC PRESENT, PREDOMINANTLY MONONUCLEAR RARE SQUAMOUS EPITHELIAL CELLS PRESENT NO ORGANISMS SEEN Performed at [facility], [address], [place], [place] [address]    Report Status 02/10/2023 FINAL  Final     Labs: Basic Metabolic Panel: Recent Labs  Lab 02/13/23 0052 02/14/23 0922 02/15/23 0105 02/15/23 1057 02/16/23 0008  NA 136 137 140 140 142  K 4.7 4.6 4.1 4.3 4.2  CL 95* 94* 95* 97* 98  CO2 30 33* 35* 30 37*  GLUCOSE 113* 134* 134* 146* 134*  BUN 62* 71* 67* 67* 65*  CREATININE 1.65* 1.36* 1.21* 1.11* 1.13*  CALCIUM 9.2 9.5 9.6 9.6 9.6   Liver Function Tests: Recent Labs  Lab 02/10/23 1556 02/14/23 0922  AST 19 48*  ALT 17 41  ALKPHOS 74 75  BILITOT 0.6 0.7  PROT 5.7* 6.0*  ALBUMIN 2.8* 2.7*   No results for input(s): "LIPASE", "AMYLASE" in the last 168 hours. No results for input(s): "  AMMONIA" in the last 168 hours. CBC: Recent Labs  Lab 02/10/23 0127 02/11/23 0046 02/12/23 0046 02/13/23 0052  WBC 7.2 9.4 6.9 7.3  HGB 9.7* 10.7* 8.8* 9.4*  HCT 31.7* 33.7* 28.3* 30.2*  MCV 97.5 95.2 96.9 95.9  PLT 191 242 172 193   Cardiac Enzymes: No results for input(s): "CKTOTAL", "CKMB", "CKMBINDEX", "TROPONINI" in the last 168 hours. BNP: BNP (last 3 results) Recent Labs    11/18/22 0635 12/14/22 1246 02/07/23 1641  BNP  136.1* 117.9* 473.9*    ProBNP (last 3 results) No results for input(s): "PROBNP" in the last 8760 hours.  CBG: Recent Labs  Lab 02/13/23 2042  GLUCAP 144*       Signed:  Zannie Cove MD.  [facility] 02/16/2023, 12:14 PM

## 2023-02-17 ENCOUNTER — Encounter: Payer: Self-pay | Admitting: *Deleted

## 2023-02-17 ENCOUNTER — Ambulatory Visit: Payer: Medicare Other | Admitting: *Deleted

## 2023-02-17 NOTE — Patient Outreach (Signed)
Care Coordination   Follow Up Visit Note   02/17/2023  Name: [name] MRN: [id number] DOB: [birth year]  [name] is a 88 y.o. year old female who sees [name], [name], MD for primary care. I spoke with patient's son, [name], [name]. by phone today.  What matters to the patients health and wellness today?  Provide Counseling & Supportive Services.    Goals Addressed               This Visit's Progress     COMPLETED: Provide Counseling & Supportive Services. (pt-stated)   On track     Care Coordination Interventions:  Interventions Today    Flowsheet Row Most Recent Value  Chronic Disease   Chronic disease during today's visit Atrial Fibrillation (AFib), Hypertension (HTN), Congestive Heart Failure (CHF), Chronic Kidney Disease/End Stage Renal Disease (ESRD), Chronic Obstructive Pulmonary Disease (COPD), Other  [Feelings of Sadness & Frustration Regarding Loss of Independence.]  General Interventions   General Interventions Discussed/Reviewed General Interventions Discussed, General Interventions Reviewed, Annual Eye Exam, Durable Medical Equipment (DME), Vaccines, Health Screening, Community Resources, Level of Care, Communication with, Doctor Visits  [Primary Care Provider]  Vaccines COVID-19, Flu, Pneumonia, RSV, Shingles, Tetanus/Pertussis/Diphtheria  [Encouraged]  Doctor Visits Discussed/Reviewed Doctor Visits Discussed, Doctor Visits Reviewed, Annual Wellness Visits, PCP, Specialist  [Encouraged]  Health Screening Bone Density, Colonoscopy, Mammogram  [Encouraged]  Durable Medical Equipment (DME) BP Cuff  PCP/Specialist Visits Compliance with follow-up visit  [Encouraged]  Communication with PCP/Specialists, RN  Level of Care Adult Daycare, Applications, Assisted Living, Personal Care Services  Applications Medicaid, Personal Care Services  Exercise Interventions   Exercise Discussed/Reviewed Exercise Discussed, Exercise Reviewed, Physical Activity, Weight Managment,  Assistive device use and maintanence  [Encouraged]  Physical Activity Discussed/Reviewed Physical Activity Discussed, Physical Activity Reviewed, Types of exercise, Home Exercise Program (HEP)  [Encouraged]  Weight Management Weight maintenance  [Encouraged]  Education Interventions   Education Provided Provided Therapist, sports, Provided Education  Provided Verbal Education On Nutrition, Foot Care, Eye Care, Mental Health/Coping with Illness, Applications, Exercise, Medication, Insurance Plans, [employer], When to see the doctor  Applications Medicaid, Personal Care Services  Mental Health Interventions   Mental Health Discussed/Reviewed Mental Health Discussed, Mental Health Reviewed, Coping Strategies, Suicide, Crisis, Substance Abuse, Grief and Loss, Depression, Anxiety  Refer to Social Work for counseling regarding Anxiety/Coping, Depression, Grief and Loss  Nutrition Interventions   Nutrition Discussed/Reviewed Nutrition Discussed, Nutrition Reviewed, Portion sizes, Decreasing sugar intake, Decreasing salt, Decreasing fats, Increaing proteins, Fluid intake, Adding fruits and vegetables  [Encouraged]  Pharmacy Interventions   Pharmacy Dicussed/Reviewed Pharmacy Topics Discussed, Pharmacy Topics Reviewed, Medication Adherence, Affording Medications  Safety Interventions   Safety Discussed/Reviewed Safety Discussed, Safety Reviewed, Fall Risk, Home Safety  [Encouraged]  Home Engineer, water, Refer for community resources  Advanced Directive Interventions   Advanced Directives Discussed/Reviewed Advanced Directives Discussed, Advanced Directives Reviewed     Active Listening & Reflection Utilized.  Verbalization of Feelings Encouraged.  Emotional Support Provided. Feelings of Grief Validated. Grief & Loss Support Groups Provided. Self-Enrollment in Grief & Loss Support Group of Interest Emphasized. Problem Solving Interventions Completed. Task-Centered Solutions  Performed.   Acceptance & Commitment Therapy Initiated. Brief Cognitive Behavioral Therapy Indicated. CSW Collaboration with Son, [name], Jr. to Confirm Placement at [name] in [place] [phone]), for End-of-Life Care. CSW Collaboration with Son, [name], Jr. to [employer] with CSW [phone]# [phone]), if He Has Questions, Needs Assistance, or If Additional Social Work  Needs Are Identified in [place].      SDOH assessments and interventions completed:  Yes.  Care Coordination Interventions:  Yes, provided.   Follow up plan: No further intervention required.   Encounter Outcome:  Pt. Visit Completed.   [name], BSW, MSW, LCSW  Licensed Restaurant manager, [employer] food [facility]  Mailing [place] [address] [place], [place] [address] Physical Address-[address], [place], [place] [address] Toll Free Main # [phone] Fax # [phone] Cell # [phone] [email or web address]

## 2023-02-17 NOTE — Patient Instructions (Signed)
Visit Information  Thank you for taking time to visit with me today. Please don't hesitate to contact me if I can be of assistance to you.   Following are the goals we discussed today:   Goals Addressed               This Visit's Progress     COMPLETED: Provide Counseling & Supportive Services. (pt-stated)   On track     Care Coordination Interventions:  Interventions Today    Flowsheet Row Most Recent Value  Chronic Disease   Chronic disease during today's visit Atrial Fibrillation (AFib), Hypertension (HTN), Congestive Heart Failure (CHF), Chronic Kidney Disease/End Stage Renal Disease (ESRD), Chronic Obstructive Pulmonary Disease (COPD), Other  [Feelings of Sadness & Frustration Regarding Loss of Independence.]  General Interventions   General Interventions Discussed/Reviewed General Interventions Discussed, General Interventions Reviewed, Annual Eye Exam, Durable Medical Equipment (DME), Vaccines, Health Screening, Community Resources, Level of Care, Communication with, Doctor Visits  [Primary Care Provider]  Vaccines COVID-19, Flu, Pneumonia, RSV, Shingles, Tetanus/Pertussis/Diphtheria  [Encouraged]  Doctor Visits Discussed/Reviewed Doctor Visits Discussed, Doctor Visits Reviewed, Annual Wellness Visits, PCP, Specialist  [Encouraged]  Health Screening Bone Density, Colonoscopy, Mammogram  [Encouraged]  Durable Medical Equipment (DME) BP Cuff  PCP/Specialist Visits Compliance with follow-up visit  [Encouraged]  Communication with PCP/Specialists, RN  Level of Care Adult Daycare, Applications, Assisted Living, Personal Care Services  Applications Medicaid, Personal Care Services  Exercise Interventions   Exercise Discussed/Reviewed Exercise Discussed, Exercise Reviewed, Physical Activity, Weight Managment, Assistive device use and maintanence  [Encouraged]  Physical Activity Discussed/Reviewed Physical Activity Discussed, Physical Activity Reviewed, Types of exercise, Home  Exercise Program (HEP)  [Encouraged]  Weight Management Weight maintenance  [Encouraged]  Education Interventions   Education Provided Provided Therapist, sports, Provided Education  Provided Verbal Education On Nutrition, Foot Care, Eye Care, Mental Health/Coping with Illness, Applications, Exercise, Medication, Insurance Plans, [employer], When to see the doctor  Applications Medicaid, Personal Care Services  Mental Health Interventions   Mental Health Discussed/Reviewed Mental Health Discussed, Mental Health Reviewed, Coping Strategies, Suicide, Crisis, Substance Abuse, Grief and Loss, Depression, Anxiety  Refer to Social Work for counseling regarding Anxiety/Coping, Depression, Grief and Loss  Nutrition Interventions   Nutrition Discussed/Reviewed Nutrition Discussed, Nutrition Reviewed, Portion sizes, Decreasing sugar intake, Decreasing salt, Decreasing fats, Increaing proteins, Fluid intake, Adding fruits and vegetables  [Encouraged]  Pharmacy Interventions   Pharmacy Dicussed/Reviewed Pharmacy Topics Discussed, Pharmacy Topics Reviewed, Medication Adherence, Affording Medications  Safety Interventions   Safety Discussed/Reviewed Safety Discussed, Safety Reviewed, Fall Risk, Home Safety  [Encouraged]  Home Engineer, water, Refer for community resources  Advanced Directive Interventions   Advanced Directives Discussed/Reviewed Advanced Directives Discussed, Advanced Directives Reviewed     Active Listening & Reflection Utilized.  Verbalization of Feelings Encouraged.  Emotional Support Provided. Feelings of Grief Validated. Grief & Loss Support Groups Provided. Self-Enrollment in Grief & Loss Support Group of Interest Emphasized. Problem Solving Interventions Completed. Task-Centered Solutions Performed.   Acceptance & Commitment Therapy Initiated. Brief Cognitive Behavioral Therapy Indicated. CSW Collaboration with Son, [name] Roeper, Jr. to Confirm Placement at  [facility] in [place] [phone]), for End-of-Life Care. CSW Collaboration with Son, [name] Yoshida, Jr. to [employer] with CSW [phone]# [phone]), if He Has Questions, Needs Assistance, or If Additional Social Work Needs Are Identified in [employer].      Please call the care guide team at [phone] if you need to cancel or reschedule your appointment.   If  you are experiencing a Mental Health or Behavioral Health Crisis or need someone to talk to, please call the Suicide and Crisis Lifeline: 988 call the [place] National Suicide Prevention Lifeline: (902)483-6690 or TTY: 347 832 2864 TTY 701-009-2038) to talk to a trained counselor call 1-800-273-TALK (toll free, 24 hour hotline) go to [facility] [address] [place] [phone]) call the [facility] Crisis Line: [phone] call 911  Patient verbalizes understanding of instructions and care plan provided today and agrees to view in MyChart. Active MyChart status and patient understanding of how to access instructions and care plan via MyChart confirmed with patient.     No further follow up required.  Danford Bad, BSW, MSW, LCSW  Licensed Restaurant manager, fast food [facility]  Mailing [place] [address] [place], [place] [address] Physical Address-[address] [place], [place] [address] Toll Free Main # [phone] Fax # [phone] Cell # [phone] [email or web address]

## 2023-02-17 NOTE — Progress Notes (Signed)
Palliative Medicine Progress Note   Patient Name: [name]       Date: 02/17/2023 DOB: [birth year]  Age: 88 y.o. MRN#: [id number] Attending Physician: No att. providers found Primary Care Physician: Donita Brooks, MD Admit Date: 02/07/2023    HPI/Patient Profile: 88 y.o. female  with past medical history of HFpEF, severe pulmonary hypertension, aortic stenosis, CAD, atrial fibrillation, chronic hyponatremia, CKD stage III, and anemia of chronic disease.  She was recently admitted to [facility] hospital 01/08/2023 through 01/17/2023 after a fall and was found to have an L5 compression fracture.  She was discharged to [facility] for rehab.  She presented to the ED 02/07/2023 with dyspnea and was admitted with acute on chronic heart failure and pleural effusion.  Status post thoracentesis on 6/6 and again on 6/10.   Palliative Medicine was consulted for goals of care.    Subjective: Patient appears comfortable.  I did not attempt to wake her. No non-verbal signs of pain or discomfort noted. No excessive respiratory secretions noted. No family present at bedside at the moment.      Objective:  Physical Exam Vitals reviewed.  Constitutional:      General: She is sleeping. She is not in acute distress.    Appearance: She is ill-appearing.  Pulmonary:     Effort: No respiratory distress.              Palliative Medicine Assessment & Plan   Assessment: Principal Problem:   Acute on chronic diastolic heart failure (HCC) Active Problems:   GERD (gastroesophageal reflux disease)   Essential hypertension   COPD (chronic obstructive pulmonary disease) (HCC)   Coronary artery disease   Persistent atrial fibrillation (HCC)   Closed compression fracture of L5 lumbar vertebra, initial encounter  (HCC)   Anemia of chronic disease   Pressure injury of skin   Heart failure (HCC)    Recommendations/Plan: Continue comfort focused care PRN medications are available for symptom management at EOL Pending transfer to hospice home in [place]   Code Status: DNR   Prognosis:  < 2 weeks   Care plan was discussed with Dr. Jomarie Longs, RN, TOC, and hospice liaison  Thank you for allowing the Palliative Medicine Team to assist in the care of this patient.   MDM - low   Merry Proud,  NP   Please contact Palliative Medicine Team phone at [phone] for questions and concerns.  For individual providers, please see AMION.

## 2023-02-21 ENCOUNTER — Encounter (HOSPITAL_COMMUNITY): Payer: Medicare Other

## 2023-02-22 ENCOUNTER — Inpatient Hospital Stay: Payer: Medicare Other | Admitting: Family Medicine

## 2023-02-22 ENCOUNTER — Encounter: Payer: Medicare Other | Admitting: *Deleted

## 2023-03-07 DEATH — deceased

## 2023-03-09 ENCOUNTER — Ambulatory Visit: Payer: Medicare Other | Admitting: Podiatry

## 2023-04-05 ENCOUNTER — Encounter (HOSPITAL_COMMUNITY): Payer: Medicare Other | Admitting: Cardiology
